# Patient Record
Sex: Female | Born: 1955 | Race: White | Hispanic: No | Marital: Single | Smoking: Former smoker
Health system: Southern US, Community
[De-identification: ages and names within clinical notes are randomized; demographics above are authoritative.]

## PROBLEM LIST (undated history)

## (undated) DIAGNOSIS — E669 Obesity, unspecified: Secondary | ICD-10-CM

## (undated) DIAGNOSIS — I10 Essential (primary) hypertension: Secondary | ICD-10-CM

## (undated) DIAGNOSIS — E079 Disorder of thyroid, unspecified: Secondary | ICD-10-CM

## (undated) DIAGNOSIS — N200 Calculus of kidney: Secondary | ICD-10-CM

## (undated) DIAGNOSIS — K90829 Short bowel syndrome, unspecified: Secondary | ICD-10-CM

## (undated) DIAGNOSIS — R011 Cardiac murmur, unspecified: Secondary | ICD-10-CM

## (undated) DIAGNOSIS — I2699 Other pulmonary embolism without acute cor pulmonale: Secondary | ICD-10-CM

## (undated) DIAGNOSIS — K529 Noninfective gastroenteritis and colitis, unspecified: Secondary | ICD-10-CM

## (undated) DIAGNOSIS — K912 Postsurgical malabsorption, not elsewhere classified: Secondary | ICD-10-CM

## (undated) HISTORY — PX: KIDNEY STONE SURGERY: SHX686

## (undated) HISTORY — PX: WRIST SURGERY: SHX841

## (undated) HISTORY — PX: PANNICULECTOMY: SUR1001

## (undated) HISTORY — PX: LIVER RESECTION: SHX1977

## (undated) HISTORY — PX: ABDOMINAL WALL MESH  REMOVAL: SHX1116

---

## 1997-01-10 DIAGNOSIS — I82409 Acute embolism and thrombosis of unspecified deep veins of unspecified lower extremity: Secondary | ICD-10-CM | POA: Insufficient documentation

## 1997-01-10 HISTORY — DX: Acute embolism and thrombosis of unspecified deep veins of unspecified lower extremity: I82.409

## 2007-01-11 HISTORY — PX: COLON SURGERY: SHX602

## 2007-01-11 HISTORY — PX: ILEOSTOMY: SHX1783

## 2007-01-11 HISTORY — PX: ILEOSTOMY CLOSURE: SHX1784

## 2007-05-11 HISTORY — PX: CHOLECYSTECTOMY: SHX55

## 2007-08-27 ENCOUNTER — Emergency Department (HOSPITAL_COMMUNITY): Admission: EM | Admit: 2007-08-27 | Discharge: 2007-08-27 | Payer: Self-pay | Admitting: Emergency Medicine

## 2007-08-31 ENCOUNTER — Inpatient Hospital Stay (HOSPITAL_COMMUNITY): Admission: EM | Admit: 2007-08-31 | Discharge: 2007-09-02 | Payer: Self-pay | Admitting: Emergency Medicine

## 2007-10-11 HISTORY — PX: HERNIA REPAIR: SHX51

## 2008-08-07 ENCOUNTER — Emergency Department (HOSPITAL_BASED_OUTPATIENT_CLINIC_OR_DEPARTMENT_OTHER): Admission: EM | Admit: 2008-08-07 | Discharge: 2008-08-07 | Payer: Self-pay | Admitting: Emergency Medicine

## 2009-01-10 DIAGNOSIS — I2699 Other pulmonary embolism without acute cor pulmonale: Secondary | ICD-10-CM | POA: Insufficient documentation

## 2009-01-10 HISTORY — DX: Other pulmonary embolism without acute cor pulmonale: I26.99

## 2009-11-22 ENCOUNTER — Ambulatory Visit: Payer: Self-pay | Admitting: Diagnostic Radiology

## 2009-11-22 ENCOUNTER — Emergency Department (HOSPITAL_BASED_OUTPATIENT_CLINIC_OR_DEPARTMENT_OTHER): Admission: EM | Admit: 2009-11-22 | Discharge: 2009-11-22 | Payer: Self-pay | Admitting: Emergency Medicine

## 2010-03-23 LAB — COMPREHENSIVE METABOLIC PANEL
ALT: 9 U/L (ref 0–35)
Albumin: 3.7 g/dL (ref 3.5–5.2)
Alkaline Phosphatase: 73 U/L (ref 39–117)
BUN: 18 mg/dL (ref 6–23)
Calcium: 8.9 mg/dL (ref 8.4–10.5)
Glucose, Bld: 93 mg/dL (ref 70–99)
Potassium: 3.3 mEq/L — ABNORMAL LOW (ref 3.5–5.1)
Sodium: 143 mEq/L (ref 135–145)
Total Protein: 6.6 g/dL (ref 6.0–8.3)

## 2010-03-23 LAB — DIFFERENTIAL
Lymphs Abs: 1.9 10*3/uL (ref 0.7–4.0)
Monocytes Absolute: 0.4 10*3/uL (ref 0.1–1.0)
Monocytes Relative: 6 % (ref 3–12)
Neutro Abs: 3.3 10*3/uL (ref 1.7–7.7)
Neutrophils Relative %: 57 % (ref 43–77)

## 2010-03-23 LAB — PREGNANCY, URINE: Preg Test, Ur: NEGATIVE

## 2010-03-23 LAB — URINALYSIS, ROUTINE W REFLEX MICROSCOPIC
Glucose, UA: NEGATIVE mg/dL
Ketones, ur: 15 mg/dL — AB
Protein, ur: NEGATIVE mg/dL
Urobilinogen, UA: 1 mg/dL (ref 0.0–1.0)

## 2010-03-23 LAB — CBC
HCT: 32.4 % — ABNORMAL LOW (ref 36.0–46.0)
MCHC: 35.6 g/dL (ref 30.0–36.0)
Platelets: 254 10*3/uL (ref 150–400)
RDW: 14.4 % (ref 11.5–15.5)
WBC: 5.9 10*3/uL (ref 4.0–10.5)

## 2010-05-25 NOTE — Discharge Summary (Signed)
[name], [name]              ACCOUNT NO.:  [id number]   MEDICAL RECORD NO.:  [id number]          PATIENT TYPE:  INP   LOCATION:  5149                         FACILITY:  [facility]   PHYSICIAN:  Altha Harm, MDDATE OF BIRTH:  06-15-1955   DATE OF ADMISSION:  08/31/2007  DATE OF DISCHARGE:  09/02/2007                               DISCHARGE SUMMARY   DISCHARGE DISPOSITION:  Home.   FINAL DISCHARGE DIAGNOSES:  1. Hypokalemia, repleted.  2. Chronic diarrhea.  3. Chronic intermittent emesis.  4. History of angioedema.  5. History of iron deficiency anemia, however, the patient is allergic      to iron.  6. History of intestinal perforation.  7. Status post ileostomy post perforation with ileostomy takedown.  8. New diagnosis hypothyroidism with an elevated TSH.   DISCHARGE MEDICATIONS:  1. Include the following, Reglan 10 mg p.o. a.c. and at bedtime.  2. Celexa 20 mg p.o. daily.  3. Metamucil daily p.r.n.  4. Flagyl 500 mg p.o. q.i.d., started as an outpatient on August 17      and to be completed on August 27.  5. Potassium chloride 40 mEq p.o. b.i.d.  6. Magnesium oxide 400 mg p.o. daily.  7. Synthroid 50 mcg p.o. daily.   CONSULTANTS:  None.   PROCEDURES:  None.   DIAGNOSTIC STUDIES:  Acute abdominal x-ray series which shows  nonspecific bowel gas pattern with gas filled loops but nondilated small  bowel noted.  Appearance is nonspecific but could be due to enteritis.   CODE STATUS:  Full code.   ALLERGIES:  IRON, PENICILLIN, LATEX.   CHIEF COMPLAINT:  Burning in the stomach.   HISTORY OF PRESENT ILLNESS:  Please refer to the H&P dictated by Dr.  [name] Royalty on August 31, 2007, for details of the HPI.   HOSPITAL COURSE:  1. The patient during the course of her evaluation was found to be      hypokalemic with a potassium of 2.2.  For this we were asked to      admit the patient.  The patient was admitted and started on      potassium replacement.  The patient  was also mildly dehydrated and      started on IV fluids fortified with potassium.  The patient      repleted her potassium up to 3.1 on the day of discharge and      received some additional dose of p.o. potassium.  The patient      states that she has intermittent vomiting and diarrhea which has      been going on for over a year.  The patient has been under the care      of Dr. Marcelene Butte, gastroenterologist at [facility] and under the care of her primary care physician, Dr.      Derrell Lolling.  She says that she has been hospitalized on multiple      occasions for hypokalemia associated with this.  However, the      patient is not on any potassium replacement at this time.  The      patient has had no emesis while hospitalized but did have diarrhea.      She refused Imodium stating that it has not worked for her in the      past with this diarrhea.  I would recommend that the patient be on      chronic potassium replacement as well as magnesium replacement.  2. Hypothyroidism.  The patient was found to have an elevated TSH up      to 5.132.  A free T3 and T4 were not done here in the hospital and      can be pursued by her primary care physician as an outpatient.  The      patient, however, was started on a small dose of Synthroid 50 mcg      p.o. daily.  I would recommend that the patient has a TSH, free T4      and T3 rechecked in approximately 6 weeks with further titration.  3. Enteritis.  The patient was seen in the emergency room on August 17      and determined to have an enteritis but CT criteria.  She was      started on Flagyl.  Given the fact that the patient had been      partially treated prior to coming to the hospital thus making the      assessment difficult I would recommend that the patient continues      with her Flagyl to the end of the prescription.  Otherwise, the      patient is continued on her usual medications.  She is      hemodynamically  stable.  She is tolerating diet well without any      difficulty.   FOLLOWUP:  I would recommend that the patient follow up with her primary  care physician, Dr. Derrell Lolling within a week and with her gastroenterologist,  Dr. Marcelene Butte as needed.  Dietary restrictions are none.      Altha Harm, MD  Electronically Signed     MAM/MEDQ  D:  09/02/2007  T:  09/02/2007  Job:  [id number]

## 2010-05-25 NOTE — H&P (Signed)
[name], [name] NO.:  [id number]   MEDICAL RECORD NO.:  [id number]          PATIENT TYPE:  EMS   LOCATION:  MAJO                         FACILITY:  [facility]   PHYSICIAN:  Altha Harm, MDDATE OF BIRTH:  11-16-1955   DATE OF ADMISSION:  08/31/2007  DATE OF DISCHARGE:                              HISTORY & PHYSICAL   CHIEF COMPLAINT:  Burning in her stomach.   HISTORY OF PRESENT ILLNESS:  This is a lady with a history of chronic  diarrhea and chronic intermittent emesis, who presents to the emergency  room with complaints of profound heartburn.  The patient's history of  present illness goes back to last year when the patient states that she  started having diarrhea and vomiting.  The patient had been under the  care of her primary care physician, Dr. Ninfa Meeker, at [facility] in [facility].  The patient was referred to Dr. Hyman Bower, gastroenterologist  in [facility], who evaluated her for the vomiting and diarrhea.  A  colonoscopy was performed, and the patient states that she had a  perforation which necessitated ileostomy.  The patient subsequently had  an ileostomy take-down; however, the patient states that throughout the  entire evaluation and subsequent to it, she has continued to have  diarrhea and chronic intermittent vomiting, which has necessitated her  being hospitalized for a potassium replacement on an intermittent basis;  however, the patient is not on any potassium replacement as an  outpatient.  The etiology of the vomiting and diarrhea has not been  found; however, the patient states that extensive workups have been done  as an outpatient.   During her evaluation here, the patient was found to have a potassium of  2.2, and that is why we are asked to admit the patient for the  hypokalemia.  The patient is being admitted for potassium replacement;  however, given the fact that the patient has had an extensive workup and  this is a chronic  problem, the object of this hospitalization will not  be to work up the etiology of her vomiting and diarrhea.  The patient  denies any chills.  She denies any fevers.  She denies any dizziness,  loss of consciousness, body aches, or weakness.   PAST MEDICAL HISTORY:  1. Angioedema.  2. Iron-deficiency anemia.  3. Intestinal perforation.  4. Status post ileostomy with ileostomy take-down.   SOCIAL HISTORY:  Patient is unemployed at present.  She denies any  tobacco, alcohol, or drug use currently.   CURRENT MEDICATIONS:  1. Celexa.  2. Maalox.  3. Reglan.   Patient does not know the doses of these medications.  The patient was  seen in the emergency room here on the 17th and at that time given  Flagyl by the ER physician.   ALLERGIES:  IRON.   PRIMARY CARE PHYSICIAN:  Dr. Ninfa Meeker with [facility].   GASTROENTEROLOGIST:  Dr. Vashti Hey in [facility].   REVIEW OF SYSTEMS:  Twelve systems are reviewed.  All systems are  negative except as noted in the HPI.   STUDIES IN THE  EMERGENCY ROOM:  She has the following white blood cell  count of 5.3, hemoglobin 12.6, hematocrit 38.1, platelet count 246.  Sodium 142, potassium 2.2, chloride 108, bicarb 24, BUN 5, creatinine  0.8, calcium 7.7, albumin 2.5, corrected calcium 8.5.   Abdominal x-rays are negative for any evidence of intra-abdominal  process.   Review of CT of the pelvis and abdomen done on the 17th showed that the  patient had inflammation of the upper sigmoid colon, likely the mild  diffuse colitis.   PHYSICAL EXAMINATION:  The patient is lying in bed in no acute distress.  She is not ill-appearing.  VITAL SIGNS:  As follows:  Temperature 97.5 orally, blood pressure  137/85, heart rate 58, respiratory rate 18, O2 sats 95% on room air.  HEENT:  She is normocephalic and atraumatic.  Pupils are equal, round  and reactive to light and accommodation.  Extraocular movements are  intact.  Oropharynx is tachy.  No  exudate, erythema, or lesions are  noted.  NECK:  Trachea is midline.  No masses, no thyromegaly.  No carotid  bruits.  The patient has a large neck, and I am unable to appreciate any  JVD.  RESPIRATORY:  Patient has a normal respiratory effort.  Equal excursion  bilaterally.  No wheezing or rhonchi noted.  CARDIOVASCULAR:  She has a normal S1 and S2.  No murmurs, rubs or  gallops are noted.  PMI is nondisplaced.  No heaves or thrills on  palpation.  ABDOMEN:  Patient has a very obese abdomen.  She has a healed midline  scar.  The abdomen is obese, soft, nontender, nondistended.  No masses,  no hepatosplenomegaly.  MUSCULOSKELETAL:  The patient has no spinal tenderness.  She has no  warmth, swelling, or erythema around the joint.  She has no CVA  tenderness.  NEUROLOGIC:  There are no focal neurological deficits.  Cranial nerves  II-XII are grossly intact.   ASSESSMENT/PLAN:  This is a patient who has chronic diarrhea and  intermittent vomiting.  The patient has resultant hypokalemia and is  being admitted for replacement of potassium.  While she is here, I will  check a TSH on the patient, just to be sure that we do not have thyroid  dysfunction contributing to this.  Based on the history that the patient  gives, I would encourage the patient to seek further evaluation at a  center dealing entirely on enteral dysfunction.  The patient has been  worked up under the care of gastroenterologist, Dr. Marcelene Butte, and I  will refer her back to Dr. Marcelene Butte for further evaluation of her  gastrointestinal woes.      Altha Harm, MD  Electronically Signed     MAM/MEDQ  D:  08/31/2007  T:  08/31/2007  Job:  [phone]

## 2011-06-05 ENCOUNTER — Encounter (HOSPITAL_BASED_OUTPATIENT_CLINIC_OR_DEPARTMENT_OTHER): Payer: Self-pay | Admitting: *Deleted

## 2011-06-05 ENCOUNTER — Emergency Department (HOSPITAL_BASED_OUTPATIENT_CLINIC_OR_DEPARTMENT_OTHER)
Admission: EM | Admit: 2011-06-05 | Discharge: 2011-06-05 | Disposition: A | Discharge status: Home / Self Care | Payer: Medicare Other | Attending: Emergency Medicine | Admitting: Emergency Medicine

## 2011-06-05 DIAGNOSIS — I10 Essential (primary) hypertension: Secondary | ICD-10-CM | POA: Insufficient documentation

## 2011-06-05 DIAGNOSIS — Y92009 Unspecified place in unspecified non-institutional (private) residence as the place of occurrence of the external cause: Secondary | ICD-10-CM | POA: Insufficient documentation

## 2011-06-05 DIAGNOSIS — E079 Disorder of thyroid, unspecified: Secondary | ICD-10-CM | POA: Insufficient documentation

## 2011-06-05 DIAGNOSIS — Z86711 Personal history of pulmonary embolism: Secondary | ICD-10-CM | POA: Insufficient documentation

## 2011-06-05 DIAGNOSIS — X58XXXA Exposure to other specified factors, initial encounter: Secondary | ICD-10-CM | POA: Insufficient documentation

## 2011-06-05 DIAGNOSIS — S8010XA Contusion of unspecified lower leg, initial encounter: Secondary | ICD-10-CM | POA: Insufficient documentation

## 2011-06-05 HISTORY — DX: Cardiac murmur, unspecified: R01.1

## 2011-06-05 HISTORY — DX: Essential (primary) hypertension: I10

## 2011-06-05 HISTORY — DX: Disorder of thyroid, unspecified: E07.9

## 2011-06-05 HISTORY — DX: Other pulmonary embolism without acute cor pulmonale: I26.99

## 2011-06-05 NOTE — ED Provider Notes (Signed)
History     CSN: [id number]  Arrival date & time 06/05/11  1836   First MD Initiated Contact with Patient 06/05/11 1948      HPI Patient reports unknown area of redness on her right lower leg. States she noticed it yesterday and since area has become more painful and more red. Reports a history of phlebitis and pulmonary embolism. Denies history of DVT. Tender area is on anterior right lower leg. Denies chest pain, shortness of breath, lower extremity edema.   Patient is a 56 y.o. female presenting with leg pain. The history is provided by the patient.  Leg Pain  The incident occurred yesterday. The incident occurred at home. The injury mechanism is unknown. The pain is present in the right leg. The quality of the pain is described as aching. The pain is moderate. Pertinent negatives include no numbness, no inability to bear weight, no loss of motion, no muscle weakness, no loss of sensation and no tingling. She reports no foreign bodies present. The symptoms are aggravated by palpation. She has tried rest for the symptoms. The treatment provided no relief.    Past Medical History  Diagnosis Date  . Hypertension   . Thyroid disease   . Heart murmur   . Pulmonary embolism     Past Surgical History  Procedure Date  . Colon surgery   . Ileostomy   . Ileostomy closure   . Cholecystectomy   . Hernia repair     History reviewed. No pertinent family history.  History  Substance Use Topics  . Smoking status: Never Smoker   . Smokeless tobacco: Not on file  . Alcohol Use: No    OB History    Grav Para Term Preterm Abortions TAB SAB Ect Mult Living                  Review of Systems  Constitutional: Negative for fever and chills.  Cardiovascular: Negative for chest pain and leg swelling.  Musculoskeletal: Negative for back pain and joint swelling.       Leg pain  Neurological: Negative for tingling and numbness.  Hematological: Bruises/bleeds easily.  All other systems  reviewed and are negative.    Allergies  Ivp dye; Ace inhibitors; and Penicillins  Home Medications   Current Outpatient Rx  Name Route Sig Dispense Refill  . ACETAMINOPHEN 325 MG PO TABS Oral Take 650 mg by mouth every 6 (six) hours as needed. Patient used this medication for her headache.    Marland Kitchen HYDROXYZINE HCL 25 MG PO TABS Oral Take 25 mg by mouth 3 (three) times daily as needed.    Marland Kitchen LEVOTHYROXINE SODIUM 50 MCG PO TABS Oral Take 50 mcg by mouth daily.    Marland Kitchen MAGNESIUM OXIDE 400 MG PO TABS Oral Take 400 mg by mouth daily.    Marland Kitchen METOPROLOL SUCCINATE ER 25 MG PO TB24 Oral Take 25 mg by mouth daily.    Marland Kitchen MONTELUKAST SODIUM 10 MG PO TABS Oral Take 10 mg by mouth at bedtime.    Marland Kitchen PROMETHAZINE HCL 25 MG PO TABS Oral Take 25 mg by mouth every 6 (six) hours as needed.    . VENLAFAXINE HCL ER 150 MG PO CP24 Oral Take 150 mg by mouth daily.    Marland Kitchen VITAMIN D (ERGOCALCIFEROL) 50000 UNITS PO CAPS Oral Take 50,000 Units by mouth every 7 (seven) days.      BP 131/68  Pulse 77  Temp(Src) 98.5 F (36.9 C) (Oral)  Resp  20  Ht 5\' 2"  (1.575 m)  Wt 298 lb (135.172 kg)  BMI 54.50 kg/m2  SpO2 99%  Physical Exam  Vitals reviewed. Constitutional: She is oriented to person, place, and time. She appears well-developed and well-nourished.  HENT:  Head: Normocephalic and atraumatic.  Eyes: Conjunctivae and EOM are normal. Pupils are equal, round, and reactive to light.  Neck: Normal range of motion. Neck supple. No spinous process tenderness and no muscular tenderness present. No edema, no erythema and normal range of motion present.  Cardiovascular: Normal rate, regular rhythm and normal heart sounds.  Exam reveals no friction rub.   No murmur heard. Pulmonary/Chest: Effort normal and breath sounds normal. She has no wheezes. She has no rales. She exhibits no tenderness.  Abdominal:       Morbidly obese.  Musculoskeletal: Normal range of motion. She exhibits no edema and no tenderness.       Cervical  back: Normal. She exhibits normal range of motion, no tenderness, no bony tenderness, no swelling and no pain.       Thoracic back: Normal. She exhibits no tenderness, no bony tenderness, no swelling, no deformity and no pain.       Lumbar back: Normal. She exhibits normal range of motion, no tenderness, no bony tenderness, no swelling, no deformity and no pain.  Neurological: She is alert and oriented to person, place, and time. She has normal strength. No sensory deficit. Coordination and gait normal.  Skin: Skin is warm and dry. No rash noted. No erythema. No pallor.       Right anterior lower leg has a large 5 x 5 cm hematoma. Tender to Palpation. No cords or masses palpated. Normal distal pulses and sensation. Patient does have bilateral +1 pitting edema.    ED Course  Procedures  MDM  Patient's lower extremity does not appear concerning for DVT. Concerning area is of anterior leg.Marland Kitchen Appears to be bruised. Advised patient to use warm compresses. Patient does reports she bruises easily. Patient is ready for discharge. Voices understanding to return for worsening symptoms.      Thomasene Lot, PA-C 06/05/11 2113

## 2011-06-05 NOTE — Discharge Instructions (Signed)

## 2011-06-05 NOTE — ED Notes (Signed)
Pt states she noticed a small red area to her right leg last night that has gotten bigger and now burns.

## 2011-06-05 NOTE — ED Provider Notes (Signed)
Medical screening examination/treatment/procedure(s) were performed by non-physician practitioner and as supervising physician I was immediately available for consultation/collaboration.  Ethelda Chick, MD 06/05/11 2214

## 2011-11-06 ENCOUNTER — Emergency Department (HOSPITAL_BASED_OUTPATIENT_CLINIC_OR_DEPARTMENT_OTHER)
Admission: EM | Admit: 2011-11-06 | Discharge: 2011-11-06 | Disposition: A | Discharge status: Home / Self Care | Payer: Medicare Other | Attending: Emergency Medicine | Admitting: Emergency Medicine

## 2011-11-06 ENCOUNTER — Emergency Department (HOSPITAL_BASED_OUTPATIENT_CLINIC_OR_DEPARTMENT_OTHER): Payer: Medicare Other

## 2011-11-06 ENCOUNTER — Encounter (HOSPITAL_BASED_OUTPATIENT_CLINIC_OR_DEPARTMENT_OTHER): Payer: Self-pay | Admitting: *Deleted

## 2011-11-06 DIAGNOSIS — Z8041 Family history of malignant neoplasm of ovary: Secondary | ICD-10-CM | POA: Insufficient documentation

## 2011-11-06 DIAGNOSIS — D219 Benign neoplasm of connective and other soft tissue, unspecified: Secondary | ICD-10-CM

## 2011-11-06 DIAGNOSIS — N949 Unspecified condition associated with female genital organs and menstrual cycle: Secondary | ICD-10-CM | POA: Insufficient documentation

## 2011-11-06 DIAGNOSIS — E079 Disorder of thyroid, unspecified: Secondary | ICD-10-CM | POA: Insufficient documentation

## 2011-11-06 DIAGNOSIS — R102 Pelvic and perineal pain: Secondary | ICD-10-CM

## 2011-11-06 DIAGNOSIS — N950 Postmenopausal bleeding: Secondary | ICD-10-CM | POA: Insufficient documentation

## 2011-11-06 DIAGNOSIS — I10 Essential (primary) hypertension: Secondary | ICD-10-CM | POA: Insufficient documentation

## 2011-11-06 DIAGNOSIS — Z8679 Personal history of other diseases of the circulatory system: Secondary | ICD-10-CM | POA: Insufficient documentation

## 2011-11-06 DIAGNOSIS — D259 Leiomyoma of uterus, unspecified: Secondary | ICD-10-CM | POA: Insufficient documentation

## 2011-11-06 DIAGNOSIS — Z79899 Other long term (current) drug therapy: Secondary | ICD-10-CM | POA: Insufficient documentation

## 2011-11-06 DIAGNOSIS — Z86718 Personal history of other venous thrombosis and embolism: Secondary | ICD-10-CM | POA: Insufficient documentation

## 2011-11-06 DIAGNOSIS — Z9889 Other specified postprocedural states: Secondary | ICD-10-CM | POA: Insufficient documentation

## 2011-11-06 LAB — BASIC METABOLIC PANEL
Chloride: 101 mEq/L (ref 96–112)
Creatinine, Ser: 0.8 mg/dL (ref 0.50–1.10)
GFR calc Af Amer: >90 mL/min (ref >90)
Sodium: 138 mEq/L (ref 135–145)

## 2011-11-06 LAB — URINE MICROSCOPIC-ADD ON

## 2011-11-06 LAB — CBC WITH DIFFERENTIAL/PLATELET
Basophils Absolute: 0 10*3/uL (ref 0.0–0.1)
Basophils Relative: 0 % (ref 0–1)
HCT: 37.7 % (ref 36.0–46.0)
MCHC: 33.2 g/dL (ref 30.0–36.0)
Monocytes Absolute: 0.4 10*3/uL (ref 0.1–1.0)
Neutro Abs: 5 10*3/uL (ref 1.7–7.7)
Neutrophils Relative %: 62 % (ref 43–77)
Platelets: 294 10*3/uL (ref 150–400)
RDW: 13.6 % (ref 11.5–15.5)
WBC: 8.2 10*3/uL (ref 4.0–10.5)

## 2011-11-06 LAB — WET PREP, GENITAL
Trich, Wet Prep: NONE SEEN
Yeast Wet Prep HPF POC: NONE SEEN

## 2011-11-06 LAB — URINALYSIS, ROUTINE W REFLEX MICROSCOPIC
Glucose, UA: NEGATIVE mg/dL
Leukocytes, UA: NEGATIVE
pH: 5.5 (ref 5.0–8.0)

## 2011-11-06 MED ORDER — OXYCODONE-ACETAMINOPHEN 5-325 MG PO TABS: 1.0000 | ORAL_TABLET | ORAL | Status: DC | PRN

## 2011-11-06 MED ORDER — OXYCODONE-ACETAMINOPHEN 5-325 MG PO TABS
2.0000 | ORAL_TABLET | Freq: Once | ORAL | Status: AC
  Administered 2011-11-06: 2 via ORAL
  Filled 2011-11-06 (×2): qty 2

## 2011-11-06 NOTE — ED Provider Notes (Addendum)
History     CSN: [id number]  Arrival date & time 11/06/11  1401   First MD Initiated Contact with Patient 11/06/11 1507      Chief Complaint  Patient presents with  . Abdominal Pain    (Consider location/radiation/quality/duration/timing/severity/associated sxs/prior treatment) HPI Comments: [name] 56 y.o. female   The chief complaint is: Patient presents with:   Abdominal Pain    C/o abdominla pain and vaginal bleeding.  LMP was 02-13-2007. [facility] mother who died of ovarian cancer in her 43s.  Patient began spotting this past wed.  Seen and PCP with f/u gyn this coming tuedsy 10/29.  Yesterday patient experienced sudden onset suprapubic abdominal pain that feels "like a knife" stabbing through to her back.  At greates sever8ity oain was "100/10" and ios nw 7/10.  She had increased bleeding yesterday as well.  Soaking through 2 overnight pads an hour and went through an entire bag of pads yesterday. Patient states that she may have hx of uterine fibroids.  Denies urinary symptoms.  Patient has chronic diarrhea and nausea but this is unchanged.  Denies sexual intercourse in the last year.  Denies vaginal sxs other thatn bleeding. Denies fevers, chills, myalgias, arthralgias. Denies DOE, SOB, chest tightness or pressure, radiation to left arm, jaw or back, or diaphoresis. Denies dysuria, flank pain, frequency, urgency, or hematuria. Denies headaches, light headedness, weakness, visual disturbances. Denies  vomiting or constipation.     Patient is a 56 y.o. female presenting with abdominal pain. The history is provided by the patient.  Abdominal Pain The primary symptoms of the illness include abdominal pain and vaginal bleeding. The primary symptoms of the illness do not include fever, shortness of breath, nausea, vomiting, diarrhea, dysuria or vaginal discharge.  Symptoms associated with the illness do not include chills, constipation or hematuria.    Past Medical History   Diagnosis Date  . Hypertension   . Thyroid disease   . Heart murmur   . Pulmonary embolism     Past Surgical History  Procedure Date  . Colon surgery   . Ileostomy   . Ileostomy closure   . Cholecystectomy   . Hernia repair     No family history on file.  History  Substance Use Topics  . Smoking status: Never Smoker   . Smokeless tobacco: Not on file  . Alcohol Use: No    OB History    Grav Para Term Preterm Abortions TAB SAB Ect Mult Living                  Review of Systems  Constitutional: Negative for fever and chills.  HENT: Negative for trouble swallowing.   Respiratory: Negative for shortness of breath.   Cardiovascular: Negative for chest pain.  Gastrointestinal: Positive for abdominal pain. Negative for nausea, vomiting, diarrhea and constipation.  Genitourinary: Positive for vaginal bleeding and vaginal pain. Negative for dysuria, hematuria and vaginal discharge.  Musculoskeletal: Negative for myalgias and arthralgias.  Skin: Negative for rash.  Neurological: Negative for numbness.  All other systems reviewed and are negative.    Allergies  Ivp dye; Ace inhibitors; and Penicillins  Home Medications   Current Outpatient Rx  Name Route Sig Dispense Refill  . COLESEVELAM HCL 625 MG PO TABS Oral Take 1,875 mg by mouth 2 (two) times daily with a meal.    . DICYCLOMINE HCL 20 MG PO TABS Oral Take 20 mg by mouth every 6 (six) hours.    Marland Kitchen ESOMEPRAZOLE  MAGNESIUM 40 MG PO CPDR Oral Take 40 mg by mouth daily before breakfast.    . ACETAMINOPHEN 325 MG PO TABS Oral Take 650 mg by mouth every 6 (six) hours as needed. Patient used this medication for her headache.    Marland Kitchen HYDROXYZINE HCL 25 MG PO TABS Oral Take 25 mg by mouth 3 (three) times daily as needed.    Marland Kitchen LEVOTHYROXINE SODIUM 50 MCG PO TABS Oral Take 75 mcg by mouth daily.     Marland Kitchen MAGNESIUM OXIDE 400 MG PO TABS Oral Take 400 mg by mouth daily.    Marland Kitchen METOPROLOL SUCCINATE ER 25 MG PO TB24 Oral Take 25 mg by  mouth daily.    Marland Kitchen MONTELUKAST SODIUM 10 MG PO TABS Oral Take 10 mg by mouth at bedtime.    Marland Kitchen PROMETHAZINE HCL 25 MG PO TABS Oral Take 25 mg by mouth every 6 (six) hours as needed.    . VENLAFAXINE HCL ER 150 MG PO CP24 Oral Take 150 mg by mouth daily.    Marland Kitchen VITAMIN D (ERGOCALCIFEROL) 50000 UNITS PO CAPS Oral Take 50,000 Units by mouth every 7 (seven) days.      BP 161/103  Pulse 79  Temp 97.9 F (36.6 C) (Oral)  Resp 20  Ht 5\' 3"  (1.6 m)  Wt 301 lb (136.533 kg)  BMI 53.32 kg/m2  SpO2 100%  Physical Exam  Nursing note and vitals reviewed. Constitutional: She is oriented to person, place, and time. She appears well-developed and well-nourished. No distress.       Pleasant, morbidly obese female in NAD  HENT:  Head: Normocephalic and atraumatic.  Eyes: Conjunctivae normal and EOM are normal. Pupils are equal, round, and reactive to light. No scleral icterus.  Neck: Normal range of motion.  Cardiovascular: Normal rate, regular rhythm and normal heart sounds.  Exam reveals no gallop and no friction rub.   No murmur heard. Pulmonary/Chest: Effort normal and breath sounds normal. No respiratory distress.  Abdominal: Soft. Bowel sounds are normal. She exhibits no distension and no mass. There is no tenderness. There is no guarding.  Neurological: She is alert and oriented to person, place, and time.  Skin: Skin is warm and dry. She is not diaphoretic.    ED Course  Procedures (including critical care time)    Results for orders placed during the hospital encounter of 11/06/11  URINALYSIS, ROUTINE W REFLEX MICROSCOPIC      Component Value Range   Color, Urine YELLOW  YELLOW   APPearance CLEAR  CLEAR   Specific Gravity, Urine 1.015  1.005 - 1.030   pH 5.5  5.0 - 8.0   Glucose, UA NEGATIVE  NEGATIVE mg/dL   Hgb urine dipstick LARGE (*) NEGATIVE   Bilirubin Urine NEGATIVE  NEGATIVE   Ketones, ur NEGATIVE  NEGATIVE mg/dL   Protein, ur NEGATIVE  NEGATIVE mg/dL   Urobilinogen, UA  0.2  0.0 - 1.0 mg/dL   Nitrite NEGATIVE  NEGATIVE   Leukocytes, UA NEGATIVE  NEGATIVE  URINE MICROSCOPIC-ADD ON      Component Value Range   Squamous Epithelial / LPF RARE  RARE   WBC, UA 0-2  <3 WBC/hpf   RBC / HPF TOO NUMEROUS TO COUNT  <3 RBC/hpf   Bacteria, UA FEW (*) RARE  CBC WITH DIFFERENTIAL      Component Value Range   WBC 8.2  4.0 - 10.5 K/uL   RBC 3.95  3.87 - 5.11 MIL/uL   Hemoglobin 12.5  12.0 -  15.0 g/dL   HCT 16.1  09.6 - 04.5 %   MCV 95.4  78.0 - 100.0 fL   MCH 31.6  26.0 - 34.0 pg   MCHC 33.2  30.0 - 36.0 g/dL   RDW 40.9  81.1 - 91.4 %   Platelets 294  150 - 400 K/uL   Neutrophils Relative 62  43 - 77 %   Neutro Abs 5.0  1.7 - 7.7 K/uL   Lymphocytes Relative 31  12 - 46 %   Lymphs Abs 2.5  0.7 - 4.0 K/uL   Monocytes Relative 5  3 - 12 %   Monocytes Absolute 0.4  0.1 - 1.0 K/uL   Eosinophils Relative 2  0 - 5 %   Eosinophils Absolute 0.2  0.0 - 0.7 K/uL   Basophils Relative 0  0 - 1 %   Basophils Absolute 0.0  0.0 - 0.1 K/uL  BASIC METABOLIC PANEL      Component Value Range   Sodium 138  135 - 145 mEq/L   Potassium 3.6  3.5 - 5.1 mEq/L   Chloride 101  96 - 112 mEq/L   CO2 24  19 - 32 mEq/L   Glucose, Bld 98  70 - 99 mg/dL   BUN 15  6 - 23 mg/dL   Creatinine, Ser 7.82  0.50 - 1.10 mg/dL   Calcium 9.1  8.4 - 95.6 mg/dL   GFR calc non Af Amer 82 (*) >90 mL/min   GFR calc Af Amer >90  >90 mL/min    Pelvic exam: normal external genitalia, vulva, vagina, cervix, uterus and adnexa, VULVA: normal appearing vulva with no masses, tenderness or lesions, VAGINA: atrophic, vaginal tenderness at introitus- patient states that it feelslike "pins and needles." blood in vaginal vault- unable to detemine source exam limited by body habitus and position.- unable to visulize cervix or feel cervix/internalorgans on bimanual.  US Transvaginal Non-ob  11/06/2011  *RADIOLOGY REPORT*  Clinical Data: Post menopausal vaginal bleeding.  TRANSABDOMINAL AND TRANSVAGINAL ULTRASOUND  OF PELVIS Technique:  Both transabdominal and transvaginal ultrasound examinations of the pelvis were performed. Transabdominal technique was performed for global imaging of the pelvis including uterus, ovaries, adnexal regions, and pelvic cul-de-sac.  It was necessary to proceed with endovaginal exam following the transabdominal exam to visualize the uterus and ovaries to better advantage. The transabdominal study is limited.  Comparison:  Pelvic CT 11/22/2009.  Findings:  Uterus: Measures approximately 10.6 x 5.7 x 9.5 cm.  There are multiple peripherally calcified intramural fibroids.  The largest measures 5.0 x 5.3 x 4.4 cm.  Endometrium: Measures 6.9 mm in thickness.  Reported LMP is 01/16/2007.  Right ovary:  Not visualized.  Left ovary: Not visualized.  Other findings: No adnexal mass or free pelvic fluid identified.  IMPRESSION:  1.  Multiple uterine fibroids, with peripheral calcifications as correlated with prior CT. 2.  The endometrium measures 6.9 mm in thickness in this patient who is post menopausal. Correlation with hormone replacement therapy necessary. In the setting of post-menopausal bleeding and no hormone replacement therapy, endometrial sampling is indicated to exclude carcinoma.  If results are benign, sonohysterogram should be considered for focal lesion work-up. (Ref:  Radiological Reasoning: Algorithmic Workup of Abnormal Vaginal Bleeding with Endovaginal Sonography and Sonohysterography. AJR 2008; 213:Y86-57) 3.  Neither ovary visualized.   Original Report Authenticated By: Gerrianne Scale, M.D.    US Pelvis Complete  11/06/2011  *RADIOLOGY REPORT*  Clinical Data: Post menopausal vaginal bleeding.  TRANSABDOMINAL AND  TRANSVAGINAL ULTRASOUND OF PELVIS Technique:  Both transabdominal and transvaginal ultrasound examinations of the pelvis were performed. Transabdominal technique was performed for global imaging of the pelvis including uterus, ovaries, adnexal regions, and pelvic  cul-de-sac.  It was necessary to proceed with endovaginal exam following the transabdominal exam to visualize the uterus and ovaries to better advantage. The transabdominal study is limited.  Comparison:  Pelvic CT 11/22/2009.  Findings:  Uterus: Measures approximately 10.6 x 5.7 x 9.5 cm.  There are multiple peripherally calcified intramural fibroids.  The largest measures 5.0 x 5.3 x 4.4 cm.  Endometrium: Measures 6.9 mm in thickness.  Reported LMP is 01/16/2007.  Right ovary:  Not visualized.  Left ovary: Not visualized.  Other findings: No adnexal mass or free pelvic fluid identified.  IMPRESSION:  1.  Multiple uterine fibroids, with peripheral calcifications as correlated with prior CT. 2.  The endometrium measures 6.9 mm in thickness in this patient who is post menopausal. Correlation with hormone replacement therapy necessary. In the setting of post-menopausal bleeding and no hormone replacement therapy, endometrial sampling is indicated to exclude carcinoma.  If results are benign, sonohysterogram should be considered for focal lesion work-up. (Ref:  Radiological Reasoning: Algorithmic Workup of Abnormal Vaginal Bleeding with Endovaginal Sonography and Sonohysterography. AJR 2008; 657:Q46-96) 3.  Neither ovary visualized.   Original Report Authenticated By: Gerrianne Scale, M.D.      1. Post-menopausal bleeding   2. Pelvic pain in female   3. Fibroids       MDM  Patient with calcified fibroids, postmenopausal bleeding and and pelvic pain.  Patient has obgyn follow up on Tuesday 10/29. Recommend endometrial biopsy for further evaluation. Patient given pain control. Discussed [place] results and labs.  All questions answered fully.  Discussed reasons to seek immediate care. Patient expresses understanding and agrees with plan.         Arthor Captain, PA-C 11/07/11 2016  Arthor Captain, PA-C 11/18/11 [phone]

## 2011-11-06 NOTE — ED Notes (Signed)
Pelvic cart is at the bedside set up and ready for the doctor to use. 

## 2011-11-06 NOTE — ED Notes (Signed)
abd stabbing since yesterday morning, took arthritis tylenol but no relief, last dose this morning.  Started vaginal bleeding Wednesday and primary MD set up appointment for gynecologist Tuesday. Instructed to come here for continued. Pain/bleeding Some nausea, no vomiting,

## 2011-11-08 NOTE — ED Provider Notes (Addendum)
Medical screening examination/treatment/procedure(s) were performed by non-physician practitioner and as supervising physician I was immediately available for consultation/collaboration.  Ethelda Chick, MD 11/08/11 1206  Medical screening examination/treatment/procedure(s) were performed by non-physician practitioner and as supervising physician I was immediately available for consultation/collaboration.  Ethelda Chick, MD 11/18/11 [phone]

## 2011-11-18 NOTE — ED Provider Notes (Signed)
Medical screening examination/treatment/procedure(s) were performed by non-physician practitioner and as supervising physician I was immediately available for consultation/collaboration.  Ethelda Chick, MD 11/18/11 [phone]

## 2012-02-27 ENCOUNTER — Emergency Department (HOSPITAL_BASED_OUTPATIENT_CLINIC_OR_DEPARTMENT_OTHER): Payer: Medicare Other

## 2012-02-27 ENCOUNTER — Encounter (HOSPITAL_BASED_OUTPATIENT_CLINIC_OR_DEPARTMENT_OTHER): Payer: Self-pay | Admitting: *Deleted

## 2012-02-27 ENCOUNTER — Emergency Department (HOSPITAL_BASED_OUTPATIENT_CLINIC_OR_DEPARTMENT_OTHER)
Admission: EM | Admit: 2012-02-27 | Discharge: 2012-02-27 | Disposition: A | Discharge status: Home / Self Care | Payer: Medicare Other | Attending: Emergency Medicine | Admitting: Emergency Medicine

## 2012-02-27 DIAGNOSIS — R319 Hematuria, unspecified: Secondary | ICD-10-CM | POA: Insufficient documentation

## 2012-02-27 DIAGNOSIS — Z86711 Personal history of pulmonary embolism: Secondary | ICD-10-CM | POA: Insufficient documentation

## 2012-02-27 DIAGNOSIS — I10 Essential (primary) hypertension: Secondary | ICD-10-CM | POA: Insufficient documentation

## 2012-02-27 DIAGNOSIS — Z79899 Other long term (current) drug therapy: Secondary | ICD-10-CM | POA: Insufficient documentation

## 2012-02-27 DIAGNOSIS — R109 Unspecified abdominal pain: Secondary | ICD-10-CM | POA: Insufficient documentation

## 2012-02-27 DIAGNOSIS — R011 Cardiac murmur, unspecified: Secondary | ICD-10-CM | POA: Insufficient documentation

## 2012-02-27 DIAGNOSIS — E079 Disorder of thyroid, unspecified: Secondary | ICD-10-CM | POA: Insufficient documentation

## 2012-02-27 LAB — URINE MICROSCOPIC-ADD ON

## 2012-02-27 LAB — URINALYSIS, ROUTINE W REFLEX MICROSCOPIC
Leukocytes, UA: NEGATIVE
Nitrite: NEGATIVE
Specific Gravity, Urine: 1.014 (ref 1.005–1.030)
Urobilinogen, UA: 0.2 mg/dL (ref 0.0–1.0)
pH: 6 (ref 5.0–8.0)

## 2012-02-27 MED ORDER — SODIUM CHLORIDE 0.9 % IV SOLN
Freq: Once | INTRAVENOUS | Status: AC
  Administered 2012-02-27: 15:00:00 via INTRAVENOUS

## 2012-02-27 MED ORDER — HYDROMORPHONE HCL PF 1 MG/ML IJ SOLN
1.0000 mg | Freq: Once | INTRAMUSCULAR | Status: AC
  Administered 2012-02-27: 1 mg via INTRAVENOUS
  Filled 2012-02-27: qty 1

## 2012-02-27 MED ORDER — SODIUM CHLORIDE 0.9 % IV BOLUS (SEPSIS)
1000.0000 mL | Freq: Once | INTRAVENOUS | Status: AC
  Administered 2012-02-27: 1000 mL via INTRAVENOUS

## 2012-02-27 MED ORDER — OXYCODONE-ACETAMINOPHEN 5-325 MG PO TABS: 1.0000 | ORAL_TABLET | ORAL | Status: DC | PRN

## 2012-02-27 MED ORDER — ONDANSETRON HCL 4 MG/2ML IJ SOLN
4.0000 mg | Freq: Once | INTRAMUSCULAR | Status: AC
  Administered 2012-02-27: 4 mg via INTRAVENOUS
  Filled 2012-02-27: qty 2

## 2012-02-27 NOTE — ED Provider Notes (Signed)
History     CSN: [id number]  Arrival date & time 02/27/12  1324   First MD Initiated Contact with Patient 02/27/12 1333      Chief Complaint  Patient presents with  . Back Pain    Patient is a 57 y.o. female presenting with flank pain. The history is provided by the patient.  Flank Pain This is a new problem. The current episode started 3 to 5 hours ago. The problem occurs constantly. The problem has been gradually worsening. Pertinent negatives include no chest pain and no shortness of breath. Exacerbated by: palpation. Nothing relieves the symptoms. She has tried rest for the symptoms. The treatment provided no relief.  PT reports abrupt onset of left flank pain She denies injury or fall.  No cp/sob.  No focal weakness.  She has never had this pain before.    Past Medical History  Diagnosis Date  . Hypertension   . Thyroid disease   . Heart murmur   . Pulmonary embolism     Past Surgical History  Procedure Laterality Date  . Colon surgery    . Ileostomy    . Ileostomy closure    . Cholecystectomy    . Hernia repair      No family history on file.  History  Substance Use Topics  . Smoking status: Never Smoker   . Smokeless tobacco: Not on file  . Alcohol Use: No    OB History   Grav Para Term Preterm Abortions TAB SAB Ect Mult Living                  Review of Systems  Constitutional: Negative for fever.  Respiratory: Negative for shortness of breath.   Cardiovascular: Negative for chest pain.  Genitourinary: Positive for flank pain.  All other systems reviewed and are negative.    Allergies  Ivp dye; Ace inhibitors; and Penicillins  Home Medications   Current Outpatient Rx  Name  Route  Sig  Dispense  Refill  . acetaminophen (TYLENOL) 325 MG tablet   Oral   Take 650 mg by mouth every 6 (six) hours as needed. Patient used this medication for her headache.         . colesevelam (WELCHOL) 625 MG tablet   Oral   Take 1,875 mg by mouth 2 (two)  times daily with a meal.         . dicyclomine (BENTYL) 20 MG tablet   Oral   Take 20 mg by mouth every 6 (six) hours.         Marland Kitchen esomeprazole (NEXIUM) 40 MG capsule   Oral   Take 40 mg by mouth daily before breakfast.         . hydrOXYzine (ATARAX/VISTARIL) 25 MG tablet   Oral   Take 25 mg by mouth 3 (three) times daily as needed.         Marland Kitchen levothyroxine (SYNTHROID, LEVOTHROID) 50 MCG tablet   Oral   Take 100 mcg by mouth daily.          . magnesium oxide (MAG-OX) 400 MG tablet   Oral   Take 400 mg by mouth daily.         . metoprolol succinate (TOPROL-XL) 25 MG 24 hr tablet   Oral   Take 25 mg by mouth daily.         . montelukast (SINGULAIR) 10 MG tablet   Oral   Take 10 mg by mouth at bedtime.         Marland Kitchen  oxyCODONE-acetaminophen (PERCOCET/ROXICET) 5-325 MG per tablet   Oral   Take 1-2 tablets by mouth every 4 (four) hours as needed for pain.   20 tablet   0   . promethazine (PHENERGAN) 25 MG tablet   Oral   Take 25 mg by mouth every 6 (six) hours as needed.         . venlafaxine XR (EFFEXOR-XR) 150 MG 24 hr capsule   Oral   Take 150 mg by mouth daily.         . Vitamin D, Ergocalciferol, (DRISDOL) 50000 UNITS CAPS   Oral   Take 50,000 Units by mouth every 7 (seven) days.           BP 191/113  Pulse 102  Temp(Src) 98.2 F (36.8 C) (Oral)  Resp 22  SpO2 100%  Physical Exam CONSTITUTIONAL: Well developed/well nourished HEAD AND FACE: Normocephalic/atraumatic EYES: EOMI/PERRL ENMT: Mucous membranes moist NECK: supple no meningeal signs SPINE:entire spine nontender, No bruising/crepitance/stepoffs noted to spine CV: S1/S2 noted, no murmurs/rubs/gallops noted LUNGS: Lungs are clear to auscultation bilaterally, no apparent distress ABDOMEN: soft, nontender, no rebound or guarding. Multiple well healed scars noted to abdominal wall.   JX:BJYN cva tenderness, no bruising noted NEURO: Pt is awake/alert, moves all  extremitiesx4 EXTREMITIES: pulses normal, full ROM SKIN: warm, color normal PSYCH: no abnormalities of mood noted  ED Course  Procedures (including critical care time)  Labs Reviewed  URINALYSIS, ROUTINE W REFLEX MICROSCOPIC   2:04 PM Will treat pain and reassess.   3:14 PM Pt with continued flank pain.  Hematuria noted.  Suspect this could be ureterolithiasis.   Will obtain CT imaging Pt agreeable 4:04 PM Ct shows hernia, but no signs of incarceration.  In fact her abdominal exam is benign and hernia is easily reducible.  She is well appearing. She still reports flank pain.  She already had labs done today as outpatient for f/u colonoscopy this week . She reports she knows she has hernias in her abdomen.  I doubt other acute abdominal process.    MDM  Nursing notes including past medical history and social history reviewed and considered in documentation Labs/vital reviewed and considered         Joya Gaskins, MD 02/27/12 1606

## 2012-02-27 NOTE — ED Notes (Signed)
Left flank pain x 3 hours.

## 2013-08-20 ENCOUNTER — Encounter (HOSPITAL_BASED_OUTPATIENT_CLINIC_OR_DEPARTMENT_OTHER): Payer: Self-pay | Admitting: Emergency Medicine

## 2013-08-20 DIAGNOSIS — E669 Obesity, unspecified: Secondary | ICD-10-CM | POA: Diagnosis not present

## 2013-08-20 DIAGNOSIS — Z87891 Personal history of nicotine dependence: Secondary | ICD-10-CM | POA: Insufficient documentation

## 2013-08-20 DIAGNOSIS — E039 Hypothyroidism, unspecified: Secondary | ICD-10-CM | POA: Insufficient documentation

## 2013-08-20 DIAGNOSIS — R011 Cardiac murmur, unspecified: Secondary | ICD-10-CM | POA: Insufficient documentation

## 2013-08-20 DIAGNOSIS — M5380 Other specified dorsopathies, site unspecified: Secondary | ICD-10-CM | POA: Insufficient documentation

## 2013-08-20 DIAGNOSIS — Z86711 Personal history of pulmonary embolism: Secondary | ICD-10-CM | POA: Insufficient documentation

## 2013-08-20 DIAGNOSIS — Z880 Allergy status to penicillin: Secondary | ICD-10-CM | POA: Insufficient documentation

## 2013-08-20 DIAGNOSIS — Z8719 Personal history of other diseases of the digestive system: Secondary | ICD-10-CM | POA: Insufficient documentation

## 2013-08-20 DIAGNOSIS — I10 Essential (primary) hypertension: Secondary | ICD-10-CM | POA: Diagnosis not present

## 2013-08-20 DIAGNOSIS — Z87442 Personal history of urinary calculi: Secondary | ICD-10-CM | POA: Diagnosis not present

## 2013-08-20 DIAGNOSIS — Z79899 Other long term (current) drug therapy: Secondary | ICD-10-CM | POA: Diagnosis not present

## 2013-08-20 DIAGNOSIS — M549 Dorsalgia, unspecified: Secondary | ICD-10-CM | POA: Diagnosis present

## 2013-08-20 NOTE — ED Notes (Signed)
Low back pain x1 week without known injury.  Worse today. Also having left upper arm pain that started today.

## 2013-08-21 ENCOUNTER — Emergency Department (HOSPITAL_BASED_OUTPATIENT_CLINIC_OR_DEPARTMENT_OTHER): Payer: Medicare Other

## 2013-08-21 ENCOUNTER — Emergency Department (HOSPITAL_BASED_OUTPATIENT_CLINIC_OR_DEPARTMENT_OTHER)
Admission: EM | Admit: 2013-08-21 | Discharge: 2013-08-21 | Disposition: A | Discharge status: Home / Self Care | Payer: Medicare Other | Attending: Emergency Medicine | Admitting: Emergency Medicine

## 2013-08-21 ENCOUNTER — Encounter (HOSPITAL_BASED_OUTPATIENT_CLINIC_OR_DEPARTMENT_OTHER): Payer: Self-pay | Admitting: Emergency Medicine

## 2013-08-21 DIAGNOSIS — M62830 Muscle spasm of back: Secondary | ICD-10-CM

## 2013-08-21 HISTORY — DX: Noninfective gastroenteritis and colitis, unspecified: K52.9

## 2013-08-21 HISTORY — DX: Calculus of kidney: N20.0

## 2013-08-21 HISTORY — DX: Short bowel syndrome, unspecified: K90.829

## 2013-08-21 HISTORY — DX: Obesity, unspecified: E66.9

## 2013-08-21 HISTORY — DX: Postsurgical malabsorption, not elsewhere classified: K91.2

## 2013-08-21 LAB — URINALYSIS, ROUTINE W REFLEX MICROSCOPIC
Bilirubin Urine: NEGATIVE
Glucose, UA: NEGATIVE mg/dL
Hgb urine dipstick: NEGATIVE
Ketones, ur: NEGATIVE mg/dL
NITRITE: NEGATIVE
PH: 6.5 (ref 5.0–8.0)
Protein, ur: NEGATIVE mg/dL
SPECIFIC GRAVITY, URINE: 1.02 (ref 1.005–1.030)
Urobilinogen, UA: 0.2 mg/dL (ref 0.0–1.0)

## 2013-08-21 LAB — URINE MICROSCOPIC-ADD ON

## 2013-08-21 MED ORDER — TRAMADOL HCL 50 MG PO TABS
ORAL_TABLET | ORAL | Status: AC
  Administered 2013-08-21: 50 mg
  Filled 2013-08-21: qty 1

## 2013-08-21 MED ORDER — DEXAMETHASONE SODIUM PHOSPHATE 4 MG/ML IJ SOLN
INTRAMUSCULAR | Status: AC
  Administered 2013-08-21: 4 mg via INTRAMUSCULAR
  Filled 2013-08-21: qty 1

## 2013-08-21 MED ORDER — KETOROLAC TROMETHAMINE 60 MG/2ML IM SOLN
INTRAMUSCULAR | Status: AC
  Administered 2013-08-21: 60 mg via INTRAMUSCULAR
  Filled 2013-08-21: qty 2

## 2013-08-21 MED ORDER — METHOCARBAMOL 500 MG PO TABS
ORAL_TABLET | ORAL | Status: AC
  Administered 2013-08-21: 1000 mg via ORAL
  Filled 2013-08-21: qty 2

## 2013-08-21 NOTE — ED Provider Notes (Signed)
CSN: [id number]     Arrival date & time 08/20/13  2318 History   First MD Initiated Contact with Patient 08/21/13 [phone]     Chief Complaint  Patient presents with  . Back Pain     (Consider location/radiation/quality/duration/timing/severity/associated sxs/prior Treatment) Patient is a 58 y.o. female presenting with back pain. The history is provided by the patient.  Back Pain Location:  Sacro-iliac joint Quality:  Aching Radiates to:  Does not radiate Pain severity:  Severe Pain is:  Same all the time Onset quality:  Gradual Timing:  Constant Progression:  Unchanged Chronicity:  New Context: physical stress   Context: not MCA and not recent illness   Relieved by:  Nothing Worsened by:  Nothing tried Ineffective treatments:  None tried Associated symptoms: no abdominal pain, no abdominal swelling, no bladder incontinence, no bowel incontinence, no chest pain, no dysuria, no fever, no headaches, no leg pain, no numbness, no paresthesias, no pelvic pain, no perianal numbness, no tingling, no weakness and no weight loss   Risk factors: no hx of osteoporosis     Past Medical History  Diagnosis Date  . Hypertension   . Thyroid disease   . Heart murmur   . Pulmonary embolism   . Obesity   . Short gut syndrome   . Kidney stone   . Chronic diarrhea    Past Surgical History  Procedure Laterality Date  . Colon surgery    . Ileostomy    . Ileostomy closure    . Cholecystectomy    . Hernia repair    . Wrist surgery    . Kidney stone surgery     History reviewed. No pertinent family history. History  Substance Use Topics  . Smoking status: Former Research scientist (life sciences)  . Smokeless tobacco: Not on file  . Alcohol Use: No   OB History   Grav Para Term Preterm Abortions TAB SAB Ect Mult Living                 Review of Systems  Constitutional: Negative for fever and weight loss.  Respiratory: Negative for shortness of breath.   Cardiovascular: Negative for chest pain, palpitations  and leg swelling.  Gastrointestinal: Negative for abdominal pain and bowel incontinence.  Genitourinary: Negative for bladder incontinence, dysuria and pelvic pain.  Musculoskeletal: Positive for back pain.  Neurological: Negative for tingling, weakness, numbness, headaches and paresthesias.  All other systems reviewed and are negative.     Allergies  Ivp dye; Ace inhibitors; and Penicillins  Home Medications   Prior to Admission medications   Medication Sig Start Date End Date Taking? Authorizing Provider  potassium citrate (UROCIT-K) 10 MEQ (1080 MG) SR tablet Take 120 mEq by mouth 4 (four) times daily.   Yes Historical Provider, MD  rifaximin (XIFAXAN) 200 MG tablet Take 200 mg by mouth 3 (three) times daily.   Yes Historical Provider, MD  acetaminophen (TYLENOL) 325 MG tablet Take 650 mg by mouth every 6 (six) hours as needed. Patient used this medication for her headache.    Historical Provider, MD  colesevelam (WELCHOL) 625 MG tablet Take 1,875 mg by mouth 2 (two) times daily with a meal.    Historical Provider, MD  dicyclomine (BENTYL) 20 MG tablet Take 20 mg by mouth every 6 (six) hours.    Historical Provider, MD  esomeprazole (NEXIUM) 40 MG capsule Take 40 mg by mouth daily before breakfast.    Historical Provider, MD  hydrOXYzine (ATARAX/VISTARIL) 25 MG tablet Take 25 mg  by mouth 3 (three) times daily as needed.    Historical Provider, MD  levothyroxine (SYNTHROID, LEVOTHROID) 50 MCG tablet Take 125 mcg by mouth daily.     Historical Provider, MD  magnesium oxide (MAG-OX) 400 MG tablet Take 400 mg by mouth daily.    Historical Provider, MD  metoprolol succinate (TOPROL-XL) 25 MG 24 hr tablet Take 25 mg by mouth daily.    Historical Provider, MD  montelukast (SINGULAIR) 10 MG tablet Take 10 mg by mouth at bedtime.    Historical Provider, MD  oxyCODONE-acetaminophen (PERCOCET/ROXICET) 5-325 MG per tablet Take 1 tablet by mouth every 4 (four) hours as needed for pain. 02/27/12    Sharyon Cable, MD  promethazine (PHENERGAN) 25 MG tablet Take 25 mg by mouth every 6 (six) hours as needed.    Historical Provider, MD  venlafaxine XR (EFFEXOR-XR) 150 MG 24 hr capsule Take 150 mg by mouth daily.    Historical Provider, MD  Vitamin D, Ergocalciferol, (DRISDOL) 50000 UNITS CAPS Take 50,000 Units by mouth every 7 (seven) days.    Historical Provider, MD   BP 149/87  Pulse 75  Temp(Src) 97.8 F (36.6 C) (Oral)  Resp 16  Ht 5\' 3"  (1.6 m)  Wt 266 lb (120.657 kg)  BMI 47.13 kg/m2  SpO2 100% Physical Exam  Constitutional: She is oriented to person, place, and time. She appears well-developed and well-nourished. No distress.  HENT:  Head: Normocephalic and atraumatic.  Mouth/Throat: Oropharynx is clear and moist.  Eyes: Conjunctivae are normal. Pupils are equal, round, and reactive to light.  Neck: Normal range of motion. Neck supple.  Cardiovascular: Normal rate, regular rhythm and intact distal pulses.   Pulmonary/Chest: Effort normal and breath sounds normal. She has no wheezes. She has no rales.  Abdominal: Soft. Bowel sounds are normal. There is no tenderness. There is no rebound and no guarding.  Musculoskeletal: Normal range of motion. She exhibits no edema and no tenderness.  Neurological: She is alert and oriented to person, place, and time. She has normal reflexes. She displays normal reflexes. No cranial nerve deficit. She exhibits normal muscle tone.  Intact Gait L5s1 intact intact perineal sensation.  Negative neers tests of the lue, FROM NVI left hand  Skin: Skin is warm and dry.  Psychiatric: She has a normal mood and affect.    ED Course  Procedures (including critical care time) Labs Review Labs Reviewed  URINALYSIS, ROUTINE W REFLEX MICROSCOPIC - Abnormal; Notable for the following:    Leukocytes, UA SMALL (*)    All other components within normal limits  URINE MICROSCOPIC-ADD ON - Abnormal; Notable for the following:    Squamous Epithelial / LPF  FEW (*)    Casts HYALINE CASTS (*)    All other components within normal limits    Imaging Review No results found.   EKG Interpretation None      MDM   Final diagnoses:  None   Spasms of low back nsaids muscle relaxants and pain medication and close follow up     K -Rasch, MD 08/21/13 [phone]

## 2014-06-01 DIAGNOSIS — E876 Hypokalemia: Secondary | ICD-10-CM | POA: Diagnosis present

## 2014-06-01 DIAGNOSIS — R7989 Other specified abnormal findings of blood chemistry: Secondary | ICD-10-CM | POA: Insufficient documentation

## 2014-06-01 DIAGNOSIS — K529 Noninfective gastroenteritis and colitis, unspecified: Secondary | ICD-10-CM | POA: Insufficient documentation

## 2014-07-25 DIAGNOSIS — E669 Obesity, unspecified: Secondary | ICD-10-CM | POA: Diagnosis present

## 2014-07-25 DIAGNOSIS — E66812 Obesity, class 2: Secondary | ICD-10-CM | POA: Diagnosis present

## 2014-11-15 DIAGNOSIS — Z87898 Personal history of other specified conditions: Secondary | ICD-10-CM | POA: Insufficient documentation

## 2015-04-28 DIAGNOSIS — Z860101 Personal history of adenomatous and serrated colon polyps: Secondary | ICD-10-CM | POA: Insufficient documentation

## 2015-05-04 DIAGNOSIS — I10 Essential (primary) hypertension: Secondary | ICD-10-CM | POA: Diagnosis present

## 2015-05-04 DIAGNOSIS — E559 Vitamin D deficiency, unspecified: Secondary | ICD-10-CM | POA: Insufficient documentation

## 2015-07-05 ENCOUNTER — Emergency Department (HOSPITAL_BASED_OUTPATIENT_CLINIC_OR_DEPARTMENT_OTHER): Payer: Medicare Other

## 2015-07-05 ENCOUNTER — Encounter (HOSPITAL_BASED_OUTPATIENT_CLINIC_OR_DEPARTMENT_OTHER): Payer: Self-pay | Admitting: Emergency Medicine

## 2015-07-05 ENCOUNTER — Emergency Department (HOSPITAL_BASED_OUTPATIENT_CLINIC_OR_DEPARTMENT_OTHER)
Admission: EM | Admit: 2015-07-05 | Discharge: 2015-07-05 | Disposition: A | Discharge status: Home / Self Care | Payer: Medicare Other | Attending: Emergency Medicine | Admitting: Emergency Medicine

## 2015-07-05 DIAGNOSIS — Z6841 Body Mass Index (BMI) 40.0 and over, adult: Secondary | ICD-10-CM | POA: Diagnosis not present

## 2015-07-05 DIAGNOSIS — R109 Unspecified abdominal pain: Secondary | ICD-10-CM

## 2015-07-05 DIAGNOSIS — G8929 Other chronic pain: Secondary | ICD-10-CM | POA: Diagnosis not present

## 2015-07-05 DIAGNOSIS — Z79899 Other long term (current) drug therapy: Secondary | ICD-10-CM | POA: Diagnosis not present

## 2015-07-05 DIAGNOSIS — Z87891 Personal history of nicotine dependence: Secondary | ICD-10-CM | POA: Insufficient documentation

## 2015-07-05 DIAGNOSIS — E669 Obesity, unspecified: Secondary | ICD-10-CM | POA: Diagnosis not present

## 2015-07-05 DIAGNOSIS — R197 Diarrhea, unspecified: Secondary | ICD-10-CM | POA: Diagnosis not present

## 2015-07-05 DIAGNOSIS — M549 Dorsalgia, unspecified: Secondary | ICD-10-CM | POA: Insufficient documentation

## 2015-07-05 DIAGNOSIS — I10 Essential (primary) hypertension: Secondary | ICD-10-CM | POA: Insufficient documentation

## 2015-07-05 LAB — COMPREHENSIVE METABOLIC PANEL
ALBUMIN: 3.8 g/dL (ref 3.5–5.0)
ALK PHOS: 90 U/L (ref 38–126)
ALT: 9 U/L — AB (ref 14–54)
ANION GAP: 10 (ref 5–15)
AST: 15 U/L (ref 15–41)
BUN: 25 mg/dL — ABNORMAL HIGH (ref 6–20)
CHLORIDE: 100 mmol/L — AB (ref 101–111)
CO2: 26 mmol/L (ref 22–32)
Calcium: 9 mg/dL (ref 8.9–10.3)
Creatinine, Ser: 0.97 mg/dL (ref 0.44–1.00)
GFR calc Af Amer: >60 mL/min (ref >60)
GFR calc non Af Amer: >60 mL/min (ref >60)
GLUCOSE: 91 mg/dL (ref 65–99)
POTASSIUM: 4 mmol/L (ref 3.5–5.1)
SODIUM: 136 mmol/L (ref 135–145)
Total Bilirubin: 0.3 mg/dL (ref 0.3–1.2)
Total Protein: 7.4 g/dL (ref 6.5–8.1)

## 2015-07-05 LAB — URINALYSIS, ROUTINE W REFLEX MICROSCOPIC
BILIRUBIN URINE: NEGATIVE
Glucose, UA: NEGATIVE mg/dL
HGB URINE DIPSTICK: NEGATIVE
Ketones, ur: NEGATIVE mg/dL
Leukocytes, UA: NEGATIVE
Nitrite: NEGATIVE
PH: 6 (ref 5.0–8.0)
Protein, ur: NEGATIVE mg/dL
SPECIFIC GRAVITY, URINE: 1.008 (ref 1.005–1.030)

## 2015-07-05 LAB — CBC
HEMATOCRIT: 34.3 % — AB (ref 36.0–46.0)
HEMOGLOBIN: 10.5 g/dL — AB (ref 12.0–15.0)
MCH: 24.2 pg — AB (ref 26.0–34.0)
MCHC: 30.6 g/dL (ref 30.0–36.0)
MCV: 79 fL (ref 78.0–100.0)
Platelets: 425 10*3/uL — ABNORMAL HIGH (ref 150–400)
RBC: 4.34 MIL/uL (ref 3.87–5.11)
RDW: 17 % — ABNORMAL HIGH (ref 11.5–15.5)
WBC: 8.2 10*3/uL (ref 4.0–10.5)

## 2015-07-05 LAB — LIPASE, BLOOD: Lipase: 25 U/L (ref 11–51)

## 2015-07-05 MED ORDER — ONDANSETRON HCL 4 MG/2ML IJ SOLN
4.0000 mg | Freq: Once | INTRAMUSCULAR | Status: DC
  Filled 2015-07-05: qty 2

## 2015-07-05 MED ORDER — MORPHINE SULFATE (PF) 4 MG/ML IV SOLN
4.0000 mg | Freq: Once | INTRAVENOUS | Status: AC
  Administered 2015-07-05: 4 mg via INTRAVENOUS
  Filled 2015-07-05: qty 1

## 2015-07-05 NOTE — ED Notes (Signed)
Patient states that she is having pain to her right side and pelvic region. Reports that she is having some nausea

## 2015-07-05 NOTE — ED Provider Notes (Signed)
CSN: [id number]     Arrival date & time 07/05/15  1337 History  By signing my name below, I, [name], attest that this documentation has been prepared under the direction and in the presence of [name], MD.  Electronically Signed: Reola [name], ED Scribe. 07/05/2015. 3:57 PM. Dr. Arletta Bale is her surgeon   Chief Complaint  Patient presents with  . Abdominal Pain   The history is provided by the patient. A language interpreter was used.   HPI Comments: [name] is a 60 y.o. female with a PMHx of HTN, heart murmur, PE, obesity, and multiple abdominal hernias, and chronic diarrhea who presents to the Emergency Department complaining of sudden onset, waxing and waning, aching, 9/10 right-sided upper abdominal and right-sided pelvic pain onset 3 days ago. Pt has had associated nausea. Pt reports that she was getting into bed and felt a ripping sensation inside of her stomach at the onset of her pain.  It seemed to ease off yesterday, but has since come back with a cramping sensation intermittently. Pt notes that she has had three abdominal hernia repairs in the past 8 years, all in the same site. She notes that during her second hernia repair that they also preformed a cholecystectomy. During her last hernia repair she developed an abdominal infection. Her pain today does not feel similar to her history of abdominal pain with her abdominal hernias. She notes that when she sits forward that her abdomen feels swollen. She has taken Excedrin with no relief of her symptoms. She has a history of chronic back pain, but denies any acute changes since the onset of her current abdominal pain. She also states that she has a history of chronic diarrhea, but has no new noted changes. Pt denies vomiting, dysuria, trouble urinating, fever, or chest pain.  Past Medical History  Diagnosis Date  . Hypertension   . Thyroid disease   . Heart murmur   . Pulmonary embolism ([place])   . Obesity    . Short gut syndrome   . Kidney stone   . Chronic diarrhea    Past Surgical History  Procedure Laterality Date  . Colon surgery    . Ileostomy    . Ileostomy closure    . Cholecystectomy    . Hernia repair    . Wrist surgery    . Kidney stone surgery     History reviewed. No pertinent family history. Social History  Substance Use Topics  . Smoking status: Former Research scientist (life sciences)  . Smokeless tobacco: None  . Alcohol Use: No   OB History    No data available     Review of Systems  Constitutional: Negative for fever.  Cardiovascular: Negative for chest pain.  Gastrointestinal: Positive for abdominal pain and diarrhea (chronic). Negative for vomiting.  Genitourinary: Negative for dysuria and difficulty urinating.  Musculoskeletal: Positive for back pain (chronic).  All other systems reviewed and are negative.  Allergies  Ivp dye; Ace inhibitors; Doxycycline; Penicillins; and Zofran  Home Medications   Prior to Admission medications   Medication Sig Start Date End Date Taking? Authorizing Provider  gabapentin (NEURONTIN) 300 MG capsule Take 300 mg by mouth 3 (three) times daily.   Yes Historical Provider, MD  potassium chloride (K-DUR,KLOR-CON) 10 MEQ tablet Take 10 mEq by mouth 2 (two) times daily.   Yes Historical Provider, MD  tizanidine (ZANAFLEX) 2 MG capsule Take 2 mg by mouth 3 (three) times daily.   Yes Historical Provider, MD  acetaminophen (TYLENOL) 325 MG tablet Take 650 mg by mouth every 6 (six) hours as needed. Patient used this medication for her headache.    Historical Provider, MD  colesevelam (WELCHOL) 625 MG tablet Take 1,875 mg by mouth 2 (two) times daily with a meal.    Historical Provider, MD  dicyclomine (BENTYL) 20 MG tablet Take 20 mg by mouth every 6 (six) hours.    Historical Provider, MD  esomeprazole (NEXIUM) 40 MG capsule Take 40 mg by mouth daily before breakfast.    Historical Provider, MD  hydrOXYzine (ATARAX/VISTARIL) 25 MG tablet Take 25 mg by  mouth 3 (three) times daily as needed.    Historical Provider, MD  levothyroxine (SYNTHROID, LEVOTHROID) 50 MCG tablet Take 125 mcg by mouth daily.     Historical Provider, MD  magnesium oxide (MAG-OX) 400 MG tablet Take 400 mg by mouth daily.    Historical Provider, MD  metoprolol succinate (TOPROL-XL) 25 MG 24 hr tablet Take 25 mg by mouth daily.    Historical Provider, MD  montelukast (SINGULAIR) 10 MG tablet Take 10 mg by mouth at bedtime.    Historical Provider, MD  oxyCODONE-acetaminophen (PERCOCET/ROXICET) 5-325 MG per tablet Take 1 tablet by mouth every 4 (four) hours as needed for pain. 02/27/12   [name], MD  potassium citrate (UROCIT-K) 10 MEQ (1080 MG) SR tablet Take 120 mEq by mouth 4 (four) times daily.    Historical Provider, MD  promethazine (PHENERGAN) 25 MG tablet Take 25 mg by mouth every 6 (six) hours as needed.    Historical Provider, MD  rifaximin (XIFAXAN) 200 MG tablet Take 200 mg by mouth 3 (three) times daily.    Historical Provider, MD  venlafaxine XR (EFFEXOR-XR) 150 MG 24 hr capsule Take 150 mg by mouth daily.    Historical Provider, MD  Vitamin D, Ergocalciferol, (DRISDOL) 50000 UNITS CAPS Take 50,000 Units by mouth every 7 (seven) days.    Historical Provider, MD   BP 128/75 mmHg  Pulse 61  Temp(Src) 98.1 F (36.7 C) (Oral)  Resp 16  Ht 5\' 3"  (1.6 m)  Wt 258 lb (117.028 kg)  BMI 45.71 kg/m2  SpO2 100%   Physical Exam  Constitutional: She is oriented to person, place, and time. She appears well-developed and well-nourished.  HENT:  Head: Normocephalic and atraumatic.  Right Ear: External ear normal.  Left Ear: External ear normal.  Nose: Nose normal.  Eyes: Conjunctivae are normal. Right eye exhibits no discharge. Left eye exhibits no discharge.  Cardiovascular: Normal rate and regular rhythm.   Murmur heard. Pulmonary/Chest: Effort normal and breath sounds normal.  Abdominal: Soft. There is tenderness. There is CVA tenderness (Right).  Pt has  right upper and mid-abdominal tenderness, there is a moderate size right mid-abdominal hernia with no evidence of incarceration, and is easily reducible.  Neurological: She is alert and oriented to person, place, and time.  Skin: Skin is warm and dry.  Nursing note and vitals reviewed.  ED Course  Procedures (including critical care time)  DIAGNOSTIC STUDIES: Oxygen Saturation is 100% on RA, normal by my interpretation.   COORDINATION OF CARE: 3:12 PM-Discussed next steps with pt including CT Abdomen Pelvis w/o contrast, and UA. Pt verbalized understanding and is agreeable with the plan.   Labs Review Labs Reviewed  COMPREHENSIVE METABOLIC PANEL - Abnormal; Notable for the following:    Chloride 100 (*)    BUN 25 (*)    ALT 9 (*)    All other components  within normal limits  CBC - Abnormal; Notable for the following:    Hemoglobin 10.5 (*)    HCT 34.3 (*)    MCH 24.2 (*)    RDW 17.0 (*)    Platelets 425 (*)    All other components within normal limits  LIPASE, BLOOD  URINALYSIS, ROUTINE W REFLEX MICROSCOPIC (NOT AT [facility])    Imaging Review Ct Abdomen Pelvis Wo Contrast  07/05/2015  CLINICAL DATA:  Sudden onset of abdominal pain, right-sided upper abdominal and right-sided pelvic pain onset 3 days ago. EXAM: CT ABDOMEN AND PELVIS WITHOUT CONTRAST TECHNIQUE: Multidetector CT imaging of the abdomen and pelvis was performed following the standard protocol without IV contrast. COMPARISON:  CT abdomen dated 01/30/2015. FINDINGS: Lower chest:  No acute findings. Hepatobiliary: Status post cholecystectomy. Small amount of pneumobilia within the left hepatic lobe. Liver otherwise unremarkable. Pancreas: Partially infiltrated with fat but otherwise unremarkable. Spleen: Mildly enlarged but otherwise unremarkable. Adrenals/Urinary Tract: Adrenal glands appear normal. Stable small cyst exophytic to the anterior cortex of the left kidney. No renal stone or hydronephrosis bilaterally. No  ureteral or bladder calculi identified. Bladder appears normal. Stomach/Bowel: Surgical changes in the right abdomen compatible with partial colon resection. There is mild distention of the small bowel just proximal to the anastomosis, measuring up to 3.5 cm diameter, but the more proximal small bowel is not distended. This configuration is similar to previous exams. Overall, no evidence of bowel obstruction. No bowel wall thickening or evidence of bowel wall inflammation seen. Stomach appears normal. Vascular/Lymphatic: Scattered atherosclerotic changes of the normal caliber abdominal aorta. No enlarged lymph nodes seen in the abdomen or pelvis. Reproductive: Multiple large calcified fibroids within the uterus. Adnexal regions are unremarkable. Other: No free fluid or abscess collections seen. No free intraperitoneal air. Musculoskeletal: Mild degenerative change in the lumbar spine. No acute or suspicious osseous lesion. Superficial soft tissues are unremarkable. IMPRESSION: 1. Overall, no acute findings within the abdomen or pelvis. No free fluid or inflammatory change. No evidence of acute solid organ abnormality. No evidence of bowel obstruction. 2. Surgical changes of a previous partial colon resection within the right abdomen. Small bowel proximal to the anastomosis is mildly distended, with a patulous appearance, but the more proximal small bowel is not distended and this configuration is similar to previous exams with no evidence of an associated bowel obstruction. 3. Leiomyomatous uterus. 4. Aortic atherosclerosis. 5. Status post cholecystectomy. Electronically Signed   By: Franki Cabot M.D.   On: 07/05/2015 16:02   I have personally reviewed and evaluated these images and lab results as part of my medical decision-making.  MDM   Final diagnoses:  Right sided abdominal pain    No obvious cause for the patient's right-sided abdominal pain. Clinically looks like she has a abdominal wall hernia but  patient states that her abdomen has always looked like that since surgery. CT is unremarkable besides chronic changes. Pain seems controlled. She feels ready for discharge. No concerning findings on blood work and urinalysis. Discussed return precautions, recommend she follow-up with her surgeon.  I personally performed the services described in this documentation, which was scribed in my presence. The recorded information has been reviewed and is accurate.     [name], MD 07/05/15 [phone]

## 2016-04-12 DIAGNOSIS — D509 Iron deficiency anemia, unspecified: Secondary | ICD-10-CM | POA: Insufficient documentation

## 2016-05-09 DIAGNOSIS — D509 Iron deficiency anemia, unspecified: Secondary | ICD-10-CM | POA: Diagnosis present

## 2016-07-25 DIAGNOSIS — E039 Hypothyroidism, unspecified: Secondary | ICD-10-CM | POA: Diagnosis present

## 2016-07-25 DIAGNOSIS — K219 Gastro-esophageal reflux disease without esophagitis: Secondary | ICD-10-CM | POA: Diagnosis present

## 2016-08-25 DIAGNOSIS — E790 Hyperuricemia without signs of inflammatory arthritis and tophaceous disease: Secondary | ICD-10-CM | POA: Insufficient documentation

## 2017-01-02 ENCOUNTER — Encounter (HOSPITAL_BASED_OUTPATIENT_CLINIC_OR_DEPARTMENT_OTHER): Payer: Self-pay | Admitting: Emergency Medicine

## 2017-01-02 ENCOUNTER — Other Ambulatory Visit: Payer: Self-pay

## 2017-01-02 ENCOUNTER — Emergency Department (HOSPITAL_BASED_OUTPATIENT_CLINIC_OR_DEPARTMENT_OTHER)
Admission: EM | Admit: 2017-01-02 | Discharge: 2017-01-02 | Disposition: A | Discharge status: Home / Self Care | Payer: Medicare Other | Attending: Emergency Medicine | Admitting: Emergency Medicine

## 2017-01-02 ENCOUNTER — Emergency Department (HOSPITAL_BASED_OUTPATIENT_CLINIC_OR_DEPARTMENT_OTHER): Payer: Medicare Other

## 2017-01-02 DIAGNOSIS — Y929 Unspecified place or not applicable: Secondary | ICD-10-CM | POA: Insufficient documentation

## 2017-01-02 DIAGNOSIS — I10 Essential (primary) hypertension: Secondary | ICD-10-CM | POA: Insufficient documentation

## 2017-01-02 DIAGNOSIS — Y999 Unspecified external cause status: Secondary | ICD-10-CM | POA: Diagnosis not present

## 2017-01-02 DIAGNOSIS — Z79899 Other long term (current) drug therapy: Secondary | ICD-10-CM | POA: Diagnosis not present

## 2017-01-02 DIAGNOSIS — Y939 Activity, unspecified: Secondary | ICD-10-CM | POA: Insufficient documentation

## 2017-01-02 DIAGNOSIS — S52121A Displaced fracture of head of right radius, initial encounter for closed fracture: Secondary | ICD-10-CM | POA: Insufficient documentation

## 2017-01-02 DIAGNOSIS — Z87891 Personal history of nicotine dependence: Secondary | ICD-10-CM | POA: Diagnosis not present

## 2017-01-02 DIAGNOSIS — W010XXA Fall on same level from slipping, tripping and stumbling without subsequent striking against object, initial encounter: Secondary | ICD-10-CM | POA: Diagnosis not present

## 2017-01-02 DIAGNOSIS — S59801A Other specified injuries of right elbow, initial encounter: Secondary | ICD-10-CM | POA: Diagnosis present

## 2017-01-02 MED ORDER — HYDROCODONE-ACETAMINOPHEN 5-325 MG PO TABS
2.0000 | ORAL_TABLET | Freq: Once | ORAL | Status: AC
  Administered 2017-01-02: 2 via ORAL
  Filled 2017-01-02: qty 2

## 2017-01-02 NOTE — ED Provider Notes (Addendum)
[facility] EMERGENCY DEPARTMENT Provider Note   CSN: [id number] Arrival date & time: 01/02/17  1151     History   Chief Complaint Chief Complaint  Patient presents with  . Fall    HPI [name] is a 61 y.o. female.  HPI 61 year old female who presents after fall. History of PE but no longer anticoagulated, HTN and short gut syndrome. Tripped while going up the front porch steps last night. [place] [address] forward onto an outstretched right hand and onto bilateral knees. Complains of severe right elbow pain. No head injury or LOC. Denies numbness or weakness, recent illness. Took 10 mg home hydrocodone without improvement in her symptoms.  Past Medical History:  Diagnosis Date  . Chronic diarrhea   . Heart murmur   . Hypertension   . Kidney stone   . Obesity   . Pulmonary embolism ([place])   . Short gut syndrome   . Thyroid disease     There are no active problems to display for this patient.   Past Surgical History:  Procedure Laterality Date  . CHOLECYSTECTOMY    . COLON SURGERY    . HERNIA REPAIR    . ILEOSTOMY    . ILEOSTOMY CLOSURE    . KIDNEY STONE SURGERY    . WRIST SURGERY      OB History    No data available       Home Medications    Prior to Admission medications   Medication Sig Start Date End Date Taking? Authorizing Provider  acetaminophen (TYLENOL) 325 MG tablet Take 650 mg by mouth every 6 (six) hours as needed. Patient used this medication for her headache.    [provider]  colesevelam (WELCHOL) 625 MG tablet Take 1,875 mg by mouth 2 (two) times daily with a meal.    [provider]  dicyclomine (BENTYL) 20 MG tablet Take 20 mg by mouth every 6 (six) hours.    [provider]  esomeprazole (NEXIUM) 40 MG capsule Take 40 mg by mouth daily before breakfast.    [provider]  gabapentin (NEURONTIN) 300 MG capsule Take 300 mg by mouth 3 (three) times daily.    [provider]  hydrOXYzine  (ATARAX/VISTARIL) 25 MG tablet Take 25 mg by mouth 3 (three) times daily as needed.    [provider]  levothyroxine (SYNTHROID, LEVOTHROID) 50 MCG tablet Take 125 mcg by mouth daily.     [provider]  magnesium oxide (MAG-OX) 400 MG tablet Take 400 mg by mouth daily.    [provider]  metoprolol succinate (TOPROL-XL) 25 MG 24 hr tablet Take 25 mg by mouth daily.    [provider]  montelukast (SINGULAIR) 10 MG tablet Take 10 mg by mouth at bedtime.    [provider]  oxyCODONE-acetaminophen (PERCOCET/ROXICET) 5-325 MG per tablet Take 1 tablet by mouth every 4 (four) hours as needed for pain. 02/27/12   [name], MD  potassium chloride (K-DUR,KLOR-CON) 10 MEQ tablet Take 10 mEq by mouth 2 (two) times daily.    [provider]  potassium citrate (UROCIT-K) 10 MEQ (1080 MG) SR tablet Take 120 mEq by mouth 4 (four) times daily.    [provider]  promethazine (PHENERGAN) 25 MG tablet Take 25 mg by mouth every 6 (six) hours as needed.    [provider]  rifaximin (XIFAXAN) 200 MG tablet Take 200 mg by mouth 3 (three) times daily.    [provider]  tizanidine (ZANAFLEX) 2 MG capsule Take 2 mg by mouth 3 (three) times daily.    [provider]  venlafaxine XR (EFFEXOR-XR) 150 MG 24 hr capsule Take 150 mg by mouth daily.    [provider]  Vitamin D, Ergocalciferol, (DRISDOL) 50000 UNITS CAPS Take 50,000 Units by mouth every 7 (seven) days.    [provider]    Family History History reviewed. No pertinent family history.  Social History Social History   Tobacco Use  . Smoking status: Former Research scientist (life sciences)  . Smokeless tobacco: Never Used  Substance Use Topics  . Alcohol use: No  . Drug use: No     Allergies   Ivp dye [iodinated diagnostic agents]; Ace inhibitors; Doxycycline; Penicillins; and Zofran [ondansetron]   Review of Systems Review of Systems  Constitutional:  Negative for fever.  Respiratory: Negative for shortness of breath.   Gastrointestinal: Negative for abdominal pain.  Musculoskeletal:       Right elbow pain   Skin: Negative for wound.  Neurological: Negative for weakness and numbness.  Hematological: Does not bruise/bleed easily.  Psychiatric/Behavioral: Negative for confusion.     Physical Exam Updated Vital Signs BP (!) 176/107 (BP Location: Left Arm)   Pulse 96   Temp 98.6 F (37 C) (Oral)   Resp 16   Ht 5\' 3"  (1.6 m)   Wt 130.6 kg (288 lb)   SpO2 98%   BMI 51.02 kg/m   Physical Exam Physical Exam  Constitutional: Appears well-developed and well-nourished. No acute distress. HENT:  Head: Normocephalic.  Eyes: Conjunctivae are normal.  Neck: Supple, normal range of motion Cardiovascular: Normal rate and intact distal pulses.  +2 radial pulses Pulmonary/Chest: Effort normal. No respiratory distress.  Abdominal: Exhibits no distension. No tenderness to palpation Musculoskeletal: Limited ROM of the right elbow due to pain. No obvious deformity or bruising.  Neurological: Alert. Fluent speech. In tact innervation of the radial, ulnar, and median nerves of the distal right arm Skin: Skin is warm and dry.  Psychiatric: Normal mood and affect. Behavior is normal.  Nursing note and vitals reviewed.   ED Treatments / Results  Labs (all labs ordered are listed, but only abnormal results are displayed) Labs Reviewed - No data to display  EKG  EKG Interpretation None       Radiology Dg Elbow Complete Right  Result Date: 01/02/2017 CLINICAL DATA:  [place] yesterday with arm outstretched, RIGHT elbow pain EXAM: RIGHT ELBOW - COMPLETE 3+ VIEW COMPARISON:  None FINDINGS: Osseous demineralization. Joint spaces preserved. Minimally displaced intra-articular fracture of the RIGHT radial head. No additional fracture, dislocation, or bone destruction. Associated elbow joint effusion. IMPRESSION: Displaced intra-articular  fracture of RIGHT radial head with associated elbow joint effusion. Electronically Signed   By: Lavonia Dana M.D.   On: 01/02/2017 12:14    Procedures Procedures (including critical care time) SPLINT APPLICATION Date/Time: 5:73 PM Authorized by: Forde Dandy Consent: Verbal consent obtained. Risks and benefits: risks, benefits and alternatives were discussed Consent given by: patient Splint applied by: technician Location details: right arm Splint type: posterior long arm Supplies used: fiberglass Post-procedure: The splinted body part was neurovascularly unchanged following the procedure. Patient tolerance: Patient tolerated the procedure well with no immediate complications.    Medications Ordered in ED Medications  HYDROcodone-acetaminophen (NORCO/VICODIN) 5-325 MG per tablet 2 tablet (2 tablets Oral Given 01/02/17 1256)     Initial Impression / Assessment and Plan / ED Course  I have reviewed the triage vital signs  and the nursing notes.  Pertinent labs & imaging results that were available during my care of the patient were reviewed by me and considered in my medical decision making (see chart for details).     Presents after mechanical fall onto outward stretch right hand.  Extremity is neurovascularly intact.  X-ray visualized and shows a radial head fracture with intra-articular spread and displacement.  Patient is placed in a posterior long-arm elbow splint.  Discussed with Dr. Burney Gauze who follow-up with this patient in his clinic.  Patient does have home hydrocodone already for pain control. Strict return and follow-up instructions reviewed. She expressed understanding of all discharge instructions and felt comfortable with the plan of care.   Final Clinical Impressions(s) / ED Diagnoses   Final diagnoses:  Closed displaced fracture of head of right radius, initial encounter    ED Discharge Orders    None       Forde Dandy, MD 01/02/17 1340    Forde Dandy, MD 01/02/17 1344    Forde Dandy, MD 01/02/17 1344

## 2017-01-02 NOTE — ED Triage Notes (Signed)
Patient states that she was walking up the stairs to get to bed and she tripped and fell - hurt her right elbow

## 2017-01-02 NOTE — Discharge Instructions (Signed)
You do have an elbow fracture and it is placed in splint.  Please keep your elbow in the sling at all times.  Please call Dr. Bertis Ruddy office for follow-up.  He states that he should be able to get you in the office on Thursday for follow-up.  Continue to take your home hydrocodone for pain control.  Return without fail for worsening symptoms, including numbness or weakness of the hand, escalating pain, or any other symptoms concerning to you.

## 2017-01-02 NOTE — ED Notes (Signed)
Paged Hand Surgeon (Dr. Burney Gauze) for consult @ 13:20

## 2018-09-10 DIAGNOSIS — F331 Major depressive disorder, recurrent, moderate: Secondary | ICD-10-CM

## 2018-09-10 HISTORY — DX: Major depressive disorder, recurrent, moderate: F33.1

## 2018-10-11 HISTORY — PX: INCISIONAL HERNIA REPAIR: SHX193

## 2019-01-19 ENCOUNTER — Emergency Department (HOSPITAL_BASED_OUTPATIENT_CLINIC_OR_DEPARTMENT_OTHER): Payer: Medicare Other

## 2019-01-19 ENCOUNTER — Other Ambulatory Visit: Payer: Self-pay

## 2019-01-19 ENCOUNTER — Emergency Department (HOSPITAL_BASED_OUTPATIENT_CLINIC_OR_DEPARTMENT_OTHER)
Admission: EM | Admit: 2019-01-19 | Discharge: 2019-01-19 | Disposition: A | Discharge status: Home / Self Care | Payer: Medicare Other | Attending: Emergency Medicine | Admitting: Emergency Medicine

## 2019-01-19 ENCOUNTER — Encounter (HOSPITAL_BASED_OUTPATIENT_CLINIC_OR_DEPARTMENT_OTHER): Payer: Self-pay | Admitting: Emergency Medicine

## 2019-01-19 DIAGNOSIS — R5383 Other fatigue: Secondary | ICD-10-CM | POA: Diagnosis present

## 2019-01-19 DIAGNOSIS — Z86711 Personal history of pulmonary embolism: Secondary | ICD-10-CM | POA: Insufficient documentation

## 2019-01-19 DIAGNOSIS — R7989 Other specified abnormal findings of blood chemistry: Secondary | ICD-10-CM

## 2019-01-19 DIAGNOSIS — I10 Essential (primary) hypertension: Secondary | ICD-10-CM | POA: Diagnosis not present

## 2019-01-19 DIAGNOSIS — E559 Vitamin D deficiency, unspecified: Secondary | ICD-10-CM | POA: Diagnosis not present

## 2019-01-19 DIAGNOSIS — R531 Weakness: Secondary | ICD-10-CM | POA: Insufficient documentation

## 2019-01-19 DIAGNOSIS — E876 Hypokalemia: Secondary | ICD-10-CM | POA: Insufficient documentation

## 2019-01-19 DIAGNOSIS — R410 Disorientation, unspecified: Secondary | ICD-10-CM | POA: Insufficient documentation

## 2019-01-19 DIAGNOSIS — Z79899 Other long term (current) drug therapy: Secondary | ICD-10-CM | POA: Insufficient documentation

## 2019-01-19 DIAGNOSIS — E079 Disorder of thyroid, unspecified: Secondary | ICD-10-CM | POA: Insufficient documentation

## 2019-01-19 LAB — URINALYSIS, ROUTINE W REFLEX MICROSCOPIC
Bilirubin Urine: NEGATIVE
Glucose, UA: NEGATIVE mg/dL
Ketones, ur: NEGATIVE mg/dL
Leukocytes,Ua: NEGATIVE
Nitrite: NEGATIVE
Protein, ur: NEGATIVE mg/dL
Specific Gravity, Urine: >1.03 — ABNORMAL HIGH (ref 1.005–1.030)
pH: 6 (ref 5.0–8.0)

## 2019-01-19 LAB — BASIC METABOLIC PANEL
Anion gap: 11 (ref 5–15)
BUN: 17 mg/dL (ref 8–23)
CO2: 23 mmol/L (ref 22–32)
Calcium: 8.7 mg/dL — ABNORMAL LOW (ref 8.9–10.3)
Chloride: 105 mmol/L (ref 98–111)
Creatinine, Ser: 0.74 mg/dL (ref 0.44–1.00)
GFR calc Af Amer: >60 mL/min (ref >60)
GFR calc non Af Amer: >60 mL/min (ref >60)
Glucose, Bld: 129 mg/dL — ABNORMAL HIGH (ref 70–99)
Potassium: 3.1 mmol/L — ABNORMAL LOW (ref 3.5–5.1)
Sodium: 139 mmol/L (ref 135–145)

## 2019-01-19 LAB — CBC
HCT: 37.7 % (ref 36.0–46.0)
Hemoglobin: 11.6 g/dL — ABNORMAL LOW (ref 12.0–15.0)
MCH: 25.1 pg — ABNORMAL LOW (ref 26.0–34.0)
MCHC: 30.8 g/dL (ref 30.0–36.0)
MCV: 81.6 fL (ref 80.0–100.0)
Platelets: 261 10*3/uL (ref 150–400)
RBC: 4.62 MIL/uL (ref 3.87–5.11)
RDW: 16 % — ABNORMAL HIGH (ref 11.5–15.5)
WBC: 5.3 10*3/uL (ref 4.0–10.5)
nRBC: 0 % (ref 0.0–0.2)

## 2019-01-19 LAB — HEPATIC FUNCTION PANEL
ALT: 25 U/L (ref 0–44)
AST: 39 U/L (ref 15–41)
Albumin: 3.6 g/dL (ref 3.5–5.0)
Alkaline Phosphatase: 101 U/L (ref 38–126)
Bilirubin, Direct: 0.2 mg/dL (ref 0.0–0.2)
Indirect Bilirubin: 0.4 mg/dL (ref 0.3–0.9)
Total Bilirubin: 0.6 mg/dL (ref 0.3–1.2)
Total Protein: 7.9 g/dL (ref 6.5–8.1)

## 2019-01-19 LAB — URINALYSIS, MICROSCOPIC (REFLEX)

## 2019-01-19 NOTE — ED Notes (Signed)
Pt ambulated to treatment room with steady gait

## 2019-01-19 NOTE — ED Triage Notes (Addendum)
Pt here with fatigue for 2 weeks. Can eat and drink and denies other symptoms. Pt accompanied by son bc of AMS for many months. States Vit D is 18 and refuses to take Rx Vit D.

## 2019-01-19 NOTE — ED Notes (Signed)
Per son- pt has had increased fatigue and has been confused when waking up in AM. Pt recently stopped taking vit d and substituted with [employer] vitamins. Pt reports normal appetite. Denies sob/chestpain/n/v/d. Pt a/o x4.

## 2019-01-19 NOTE — ED Notes (Signed)
Pt provided water at this time.

## 2019-01-19 NOTE — Discharge Instructions (Signed)
Follow-up with the nephrologist and GI doctor as scheduled at your appointment next week. Continue to take all your medications as prescribed, including the vitamin D. Make sure you are eating foods with high levels of potassium. Return to the emergency room with any new, worsening, or concerning symptoms.

## 2019-01-19 NOTE — ED Provider Notes (Signed)
[facility] EMERGENCY DEPARTMENT Provider Note   CSN: [id number] Arrival date & time: 01/19/19  1136     History Chief Complaint  Patient presents with  . Fatigue    [name] is a 64 y.o. female presenting for evaluation of fatigue.  History provided most by patient's son per patient request.  Son states of the past 4 days, patient has been more tired than normal.  He states when she first wakes up, she is "speaking out of her head."  This improves as she wakes up more.  He states she was recently restarted on blood pressure medication about 2 weeks ago.  Other than that, no other medication changes.  Patient recently told him that she is not taking her vitamin D or iron pills.   Patient denies recent fevers, chills, cough, chest pain, shortness of breath, nausea, vomiting, domino pain, urinary symptoms.  Additional history obtained from chart review.  Patient seen by PCP recently, found to have a positive FIT stool test and spinal megaly, referred to GI.  Additionally, patient with chronic mild IDA.  Most recent vitamin D levels were lower than recommended.   HPI     Past Medical History:  Diagnosis Date  . Chronic diarrhea   . Heart murmur   . Hypertension   . Kidney stone   . Obesity   . Pulmonary embolism ([place])   . Short gut syndrome   . Thyroid disease     There are no problems to display for this patient.   Past Surgical History:  Procedure Laterality Date  . CHOLECYSTECTOMY    . COLON SURGERY    . HERNIA REPAIR    . ILEOSTOMY    . ILEOSTOMY CLOSURE    . KIDNEY STONE SURGERY    . WRIST SURGERY       OB History   No obstetric history on file.     History reviewed. No pertinent family history.  Social History   Tobacco Use  . Smoking status: Former Research scientist (life sciences)  . Smokeless tobacco: Never Used  Substance Use Topics  . Alcohol use: No  . Drug use: No    Home Medications Prior to Admission medications   Medication Sig Start Date End  Date Taking? Authorizing Provider  acetaminophen (TYLENOL) 325 MG tablet Take 650 mg by mouth every 6 (six) hours as needed. Patient used this medication for her headache.    [provider]  colesevelam (WELCHOL) 625 MG tablet Take 1,875 mg by mouth 2 (two) times daily with a meal.    [provider]  dicyclomine (BENTYL) 20 MG tablet Take 20 mg by mouth every 6 (six) hours.    [provider]  esomeprazole (NEXIUM) 40 MG capsule Take 40 mg by mouth daily before breakfast.    [provider]  gabapentin (NEURONTIN) 300 MG capsule Take 300 mg by mouth 3 (three) times daily.    [provider]  hydrOXYzine (ATARAX/VISTARIL) 25 MG tablet Take 25 mg by mouth 3 (three) times daily as needed.    [provider]  levothyroxine (SYNTHROID, LEVOTHROID) 50 MCG tablet Take 125 mcg by mouth daily.     [provider]  magnesium oxide (MAG-OX) 400 MG tablet Take 400 mg by mouth daily.    [provider]  metoprolol succinate (TOPROL-XL) 25 MG 24 hr tablet Take 25 mg by mouth daily.    [provider]  montelukast (SINGULAIR) 10 MG tablet Take 10 mg by mouth at  bedtime.    [provider]  oxyCODONE-acetaminophen (PERCOCET/ROXICET) 5-325 MG per tablet Take 1 tablet by mouth every 4 (four) hours as needed for pain. 02/27/12   [name], MD  potassium chloride (K-DUR,KLOR-CON) 10 MEQ tablet Take 10 mEq by mouth 2 (two) times daily.    [provider]  potassium citrate (UROCIT-K) 10 MEQ (1080 MG) SR tablet Take 120 mEq by mouth 4 (four) times daily.    [provider]  promethazine (PHENERGAN) 25 MG tablet Take 25 mg by mouth every 6 (six) hours as needed.    [provider]  rifaximin (XIFAXAN) 200 MG tablet Take 200 mg by mouth 3 (three) times daily.    [provider]  tizanidine (ZANAFLEX) 2 MG capsule Take 2 mg by mouth 3 (three) times daily.    [provider]    venlafaxine XR (EFFEXOR-XR) 150 MG 24 hr capsule Take 150 mg by mouth daily.    [provider]  Vitamin D, Ergocalciferol, (DRISDOL) 50000 UNITS CAPS Take 50,000 Units by mouth every 7 (seven) days.    [provider]    Allergies    Ivp dye [iodinated diagnostic agents], Ace inhibitors, Doxycycline, Penicillins, and Zofran [ondansetron]  Review of Systems   Review of Systems  Constitutional: Positive for fatigue.  Psychiatric/Behavioral: Positive for confusion (intermittent ).  All other systems reviewed and are negative.   Physical Exam Updated Vital Signs BP 128/82   Pulse 87   Temp 98.1 F (36.7 C) (Oral)   Resp (!) 21   SpO2 100%   Physical Exam Vitals and nursing note reviewed.  Constitutional:      General: She is not in acute distress.    Appearance: She is well-developed. She is obese.     Comments: Resting comfortably in the bed in no acute distress.  Nontoxic.  HENT:     Head: Normocephalic and atraumatic.  Eyes:     Extraocular Movements: Extraocular movements intact.     Conjunctiva/sclera: Conjunctivae normal.     Pupils: Pupils are equal, round, and reactive to light.  Cardiovascular:     Rate and Rhythm: Normal rate and regular rhythm.     Pulses: Normal pulses.  Pulmonary:     Effort: Pulmonary effort is normal. No respiratory distress.     Breath sounds: Normal breath sounds. No wheezing.     Comments: Clear lung sounds in all fields Abdominal:     General: There is no distension.     Palpations: Abdomen is soft. There is no mass.     Tenderness: There is no abdominal tenderness. There is no guarding or rebound.  Musculoskeletal:        General: Normal range of motion.     Cervical back: Normal range of motion and neck supple.  Skin:    General: Skin is warm and dry.     Capillary Refill: Capillary refill takes less than 2 seconds.  Neurological:     Mental Status: She is alert and oriented to person, place, and time.      GCS: GCS eye subscore is 4. GCS verbal subscore is 5. GCS motor subscore is 6.     Sensory: Sensation is intact.     Motor: Motor function is intact.     Comments: Patient is alert and oriented.  No confusion at this time.  Son states patient is not confused at this time either.     ED Results / Procedures / Treatments  Labs (all labs ordered are listed, but only abnormal results are displayed) Labs Reviewed  BASIC METABOLIC PANEL - Abnormal; Notable for the following components:      Result Value   Potassium 3.1 (*)    Glucose, Bld 129 (*)    Calcium 8.7 (*)    All other components within normal limits  CBC - Abnormal; Notable for the following components:   Hemoglobin 11.6 (*)    MCH 25.1 (*)    RDW 16.0 (*)    All other components within normal limits  URINALYSIS, ROUTINE W REFLEX MICROSCOPIC - Abnormal; Notable for the following components:   Specific Gravity, Urine >1.030 (*)    Hgb urine dipstick TRACE (*)    All other components within normal limits  URINALYSIS, MICROSCOPIC (REFLEX) - Abnormal; Notable for the following components:   Bacteria, UA FEW (*)    All other components within normal limits  HEPATIC FUNCTION PANEL    EKG None  Radiology DG Chest 2 View  Result Date: 01/19/2019 CLINICAL DATA:  Weakness and cough EXAM: CHEST - 2 VIEW COMPARISON:  Chest radiograph dated 04/27/2016. FINDINGS: The heart size is within normal limits. Vascular calcifications are seen in the aortic arch. Both lungs are clear. The visualized skeletal structures are unremarkable. IMPRESSION: No active cardiopulmonary disease. Electronically Signed   By: Zerita Boers M.D.   On: 01/19/2019 15:56   CT Head Wo Contrast  Result Date: 01/19/2019 CLINICAL DATA:  Encephalopathy. Fatigue for 2 weeks. EXAM: CT HEAD WITHOUT CONTRAST TECHNIQUE: Contiguous axial images were obtained from the base of the skull through the vertex without intravenous contrast. COMPARISON:  CT head dated 03/29/2016  FINDINGS: Brain: No evidence of acute infarction, hemorrhage, hydrocephalus, extra-axial collection or mass lesion/mass effect. Vascular: There are vascular calcifications in the carotid siphons. Skull: Normal. Negative for fracture or focal lesion. Sinuses/Orbits: No acute finding. Other: None. IMPRESSION: No acute intracranial process. Electronically Signed   By: Zerita Boers M.D.   On: 01/19/2019 15:47    Procedures Procedures (including critical care time)  Medications Ordered in ED Medications - No data to display  ED Course  I have reviewed the triage vital signs and the nursing notes.  Pertinent labs & imaging results that were available during my care of the patient were reviewed by me and considered in my medical decision making (see chart for details).    MDM Rules/Calculators/A&P                      Patient presenting for evaluation of generalized weakness and fatigue.  Physical exam shows patient appears nontoxic.  She is not confused at this time.  Son states confusion is only when she first wakes up, likely due to drowsiness.  Labs obtained from PCP have shown low vitamin D, may be contributing to fatigue.  Additionally, patient has recently had a positive FIT stool test and splenomegaly, being referred to GI, has an appointment next week.  Will obtain labs to ensure no acute electrolyte abnormality, UA to rule out infection, chest x-ray to assess for infection, and CT head due to confusion.  As patient is alert and oriented at this time, and able to ambulate, if results are reassuring I do not believe she will need admission.  Labs overall reassuring.  Mild hypokalemia at 3.1, patient takes potassium at home.  I discussed importance of taking her potassium pill and eating regular meals.  Otherwise labs are reassuring.  Mild anemia, at her  baseline.  Chest x-ray viewed interpreted by me, no pneumonia, pneumothorax, effusion, cardiomegaly.  Urine without infection.  CT head without  acute findings.  Discussed findings with patient and son.  Discussed importance of taking vitamin D as prescribed.  Discussed importance of following up with nephrology and GI next week at their scheduled appointments.  At this time, patient appears safe for discharge.  Return precautions given.  Patient states she understands and agrees to plan.  Final Clinical Impression(s) / ED Diagnoses Final diagnoses:  Transient confusion  Fatigue, unspecified type  Hypokalemia  Low vitamin D level    Rx / DC Orders ED Discharge Orders    None       Franchot Heidelberg, PA-C 01/19/19 Kai Levins, MD 01/19/19 2132

## 2019-01-21 DIAGNOSIS — E538 Deficiency of other specified B group vitamins: Secondary | ICD-10-CM | POA: Insufficient documentation

## 2019-02-06 DIAGNOSIS — R161 Splenomegaly, not elsewhere classified: Secondary | ICD-10-CM | POA: Insufficient documentation

## 2019-04-02 HISTORY — PX: ESOPHAGOSCOPY: SUR460

## 2019-04-02 HISTORY — PX: COLONOSCOPY: SHX174

## 2020-03-06 DIAGNOSIS — K439 Ventral hernia without obstruction or gangrene: Secondary | ICD-10-CM | POA: Insufficient documentation

## 2020-03-06 HISTORY — DX: Ventral hernia without obstruction or gangrene: K43.9

## 2020-08-20 DIAGNOSIS — M51369 Other intervertebral disc degeneration, lumbar region without mention of lumbar back pain or lower extremity pain: Secondary | ICD-10-CM | POA: Insufficient documentation

## 2020-08-20 DIAGNOSIS — I672 Cerebral atherosclerosis: Secondary | ICD-10-CM

## 2020-08-20 DIAGNOSIS — K90829 Short bowel syndrome, unspecified: Secondary | ICD-10-CM | POA: Diagnosis present

## 2020-08-20 HISTORY — DX: Cerebral atherosclerosis: I67.2

## 2020-09-10 DIAGNOSIS — H179 Unspecified corneal scar and opacity: Secondary | ICD-10-CM | POA: Insufficient documentation

## 2020-10-01 DIAGNOSIS — H25811 Combined forms of age-related cataract, right eye: Secondary | ICD-10-CM | POA: Insufficient documentation

## 2020-10-06 HISTORY — PX: EYE SURGERY: SHX253

## 2021-01-06 DIAGNOSIS — Z87891 Personal history of nicotine dependence: Secondary | ICD-10-CM | POA: Insufficient documentation

## 2021-01-11 ENCOUNTER — Emergency Department (HOSPITAL_BASED_OUTPATIENT_CLINIC_OR_DEPARTMENT_OTHER): Payer: Medicare (Managed Care)

## 2021-01-11 ENCOUNTER — Emergency Department (HOSPITAL_BASED_OUTPATIENT_CLINIC_OR_DEPARTMENT_OTHER)
Admission: EM | Admit: 2021-01-11 | Discharge: 2021-01-11 | Disposition: A | Discharge status: Home / Self Care | Payer: Medicare (Managed Care) | Attending: Emergency Medicine | Admitting: Emergency Medicine

## 2021-01-11 ENCOUNTER — Encounter (HOSPITAL_BASED_OUTPATIENT_CLINIC_OR_DEPARTMENT_OTHER): Payer: Self-pay | Admitting: Urology

## 2021-01-11 ENCOUNTER — Other Ambulatory Visit: Payer: Self-pay

## 2021-01-11 DIAGNOSIS — D649 Anemia, unspecified: Secondary | ICD-10-CM | POA: Insufficient documentation

## 2021-01-11 DIAGNOSIS — R790 Abnormal level of blood mineral: Secondary | ICD-10-CM | POA: Diagnosis not present

## 2021-01-11 DIAGNOSIS — J181 Lobar pneumonia, unspecified organism: Secondary | ICD-10-CM | POA: Diagnosis not present

## 2021-01-11 DIAGNOSIS — R739 Hyperglycemia, unspecified: Secondary | ICD-10-CM | POA: Insufficient documentation

## 2021-01-11 DIAGNOSIS — R2243 Localized swelling, mass and lump, lower limb, bilateral: Secondary | ICD-10-CM | POA: Insufficient documentation

## 2021-01-11 DIAGNOSIS — R0601 Orthopnea: Secondary | ICD-10-CM | POA: Diagnosis not present

## 2021-01-11 DIAGNOSIS — J189 Pneumonia, unspecified organism: Secondary | ICD-10-CM

## 2021-01-11 DIAGNOSIS — E876 Hypokalemia: Secondary | ICD-10-CM | POA: Diagnosis not present

## 2021-01-11 DIAGNOSIS — Z20822 Contact with and (suspected) exposure to covid-19: Secondary | ICD-10-CM | POA: Insufficient documentation

## 2021-01-11 DIAGNOSIS — Z79899 Other long term (current) drug therapy: Secondary | ICD-10-CM | POA: Insufficient documentation

## 2021-01-11 DIAGNOSIS — R0602 Shortness of breath: Secondary | ICD-10-CM | POA: Diagnosis present

## 2021-01-11 DIAGNOSIS — R609 Edema, unspecified: Secondary | ICD-10-CM

## 2021-01-11 LAB — CBC WITH DIFFERENTIAL/PLATELET
Abs Immature Granulocytes: 0.02 10*3/uL (ref 0.00–0.07)
Basophils Absolute: 0 10*3/uL (ref 0.0–0.1)
Basophils Relative: 1 %
Eosinophils Absolute: 0.2 10*3/uL (ref 0.0–0.5)
Eosinophils Relative: 3 %
HCT: 35.5 % — ABNORMAL LOW (ref 36.0–46.0)
Hemoglobin: 11.4 g/dL — ABNORMAL LOW (ref 12.0–15.0)
Immature Granulocytes: 0 %
Lymphocytes Relative: 25 %
Lymphs Abs: 1.8 10*3/uL (ref 0.7–4.0)
MCH: 31.9 pg (ref 26.0–34.0)
MCHC: 32.1 g/dL (ref 30.0–36.0)
MCV: 99.4 fL (ref 80.0–100.0)
Monocytes Absolute: 0.5 10*3/uL (ref 0.1–1.0)
Monocytes Relative: 8 %
Neutro Abs: 4.6 10*3/uL (ref 1.7–7.7)
Neutrophils Relative %: 63 %
Platelets: 171 10*3/uL (ref 150–400)
RBC: 3.57 MIL/uL — ABNORMAL LOW (ref 3.87–5.11)
RDW: 15 % (ref 11.5–15.5)
WBC: 7.1 10*3/uL (ref 4.0–10.5)
nRBC: 0 % (ref 0.0–0.2)

## 2021-01-11 LAB — RESP PANEL BY RT-PCR (FLU A&B, COVID) ARPGX2
Influenza A by PCR: NEGATIVE
Influenza B by PCR: NEGATIVE
SARS Coronavirus 2 by RT PCR: NEGATIVE

## 2021-01-11 LAB — COMPREHENSIVE METABOLIC PANEL
ALT: 17 U/L (ref 0–44)
AST: 19 U/L (ref 15–41)
Albumin: 3.1 g/dL — ABNORMAL LOW (ref 3.5–5.0)
Alkaline Phosphatase: 75 U/L (ref 38–126)
Anion gap: 10 (ref 5–15)
BUN: 21 mg/dL (ref 8–23)
CO2: 25 mmol/L (ref 22–32)
Calcium: 8.8 mg/dL — ABNORMAL LOW (ref 8.9–10.3)
Chloride: 104 mmol/L (ref 98–111)
Creatinine, Ser: 0.79 mg/dL (ref 0.44–1.00)
GFR, Estimated: >60 mL/min (ref >60)
Glucose, Bld: 135 mg/dL — ABNORMAL HIGH (ref 70–99)
Potassium: 3.2 mmol/L — ABNORMAL LOW (ref 3.5–5.1)
Sodium: 139 mmol/L (ref 135–145)
Total Bilirubin: 0.8 mg/dL (ref 0.3–1.2)
Total Protein: 6.7 g/dL (ref 6.5–8.1)

## 2021-01-11 LAB — BRAIN NATRIURETIC PEPTIDE: B Natriuretic Peptide: 186.1 pg/mL — ABNORMAL HIGH (ref 0.0–100.0)

## 2021-01-11 MED ORDER — FUROSEMIDE 20 MG PO TABS: 20.0000 mg | ORAL_TABLET | Freq: Every day | ORAL | 0 refills | Status: DC

## 2021-01-11 MED ORDER — AZITHROMYCIN 250 MG PO TABS: 250.0000 mg | ORAL_TABLET | Freq: Every day | ORAL | 0 refills | Status: DC

## 2021-01-11 MED ORDER — FUROSEMIDE 10 MG/ML IJ SOLN
40.0000 mg | Freq: Once | INTRAMUSCULAR | Status: AC
  Administered 2021-01-11: 40 mg via INTRAVENOUS
  Filled 2021-01-11: qty 4

## 2021-01-11 MED ORDER — POTASSIUM CHLORIDE CRYS ER 20 MEQ PO TBCR
40.0000 meq | EXTENDED_RELEASE_TABLET | Freq: Once | ORAL | Status: AC
Start: 2021-01-11 — End: 2021-01-11
  Administered 2021-01-11: 40 meq via ORAL
  Filled 2021-01-11: qty 2

## 2021-01-11 MED ORDER — AZITHROMYCIN 250 MG PO TABS
500.0000 mg | ORAL_TABLET | Freq: Once | ORAL | Status: AC
  Administered 2021-01-11: 500 mg via ORAL
  Filled 2021-01-11: qty 2

## 2021-01-11 NOTE — ED Triage Notes (Signed)
Leg swelling and weeping of RLE sarted sunday morning Small would on RLE  PCP appoint tomorrow Cardiologist last week with stress test scheduled

## 2021-01-11 NOTE — Discharge Instructions (Addendum)
As we discussed the things that we found today included peripheral edema, and elevated BNP which is consistent with early heart failure.  I do recommend you continue follow-up with your primary care doctor, and cardiologist as we discussed for further evaluation of these problems.  I prescribed you 2 medications today, 1 is an antibiotic which is for a possible early pneumonia that we see on your chest x-ray.  I recommend that you take this on a full stomach as prescribed.  Additionally I have prescribed you a fluid pill, AKA diuretic, please take this once a day for the next 5 days unless you are told to discontinue by your primary care doctor or cardiologist.  You have significant worsening of your shortness of breath, or leg swelling before you are able to see your primary care doctor or cardiologist I recommend you return to the emergency department for evaluation, please return especially if you begin to develop chest pain, or shortness of breath at rest.

## 2021-01-11 NOTE — ED Provider Notes (Signed)
[facility] EMERGENCY DEPARTMENT Provider Note   CSN: [id number] Arrival date & time: 01/11/21  1650     History  Chief Complaint  Patient presents with   Leg Swelling    [name] is a 66 y.o. female With a past medical history significant for hypertension, obesity, history of heart murmur who presents with 2 days of worsening leg swelling, and weeping fluid from the right lower extremity.  Patient reports that she has had hypertension, obesity but she has not had significant leg swelling in the past.  Patient does report that for several months she has had some shortness of breath especially when in a recumbent position, she reports that she typically sleeps in recliner.  Patient does not take a diuretic at this time.  Patient reports that she does have a follow-up scheduled with her PCP, and cardiologist soon to discuss an echocardiogram, and evaluation of potential heart conditions.  Patient denies chest pain at this time, denies nausea, vomiting, diarrhea, cough, congestion, fever, chills.  HPI     Home Medications Prior to Admission medications   Medication Sig Start Date End Date Taking? Authorizing Provider  acetaminophen (TYLENOL) 325 MG tablet Take 650 mg by mouth every 6 (six) hours as needed. Patient used this medication for her headache.    [provider]  colesevelam (WELCHOL) 625 MG tablet Take 1,875 mg by mouth 2 (two) times daily with a meal.    [provider]  dicyclomine (BENTYL) 20 MG tablet Take 20 mg by mouth every 6 (six) hours.    [provider]  esomeprazole (NEXIUM) 40 MG capsule Take 40 mg by mouth daily before breakfast.    [provider]  gabapentin (NEURONTIN) 300 MG capsule Take 300 mg by mouth 3 (three) times daily.    [provider]  hydrOXYzine (ATARAX/VISTARIL) 25 MG tablet Take 25 mg by mouth 3 (three) times daily as needed.    [provider]  levothyroxine (SYNTHROID,  LEVOTHROID) 50 MCG tablet Take 125 mcg by mouth daily.     [provider]  magnesium oxide (MAG-OX) 400 MG tablet Take 400 mg by mouth daily.    [provider]  metoprolol succinate (TOPROL-XL) 25 MG 24 hr tablet Take 25 mg by mouth daily.    [provider]  montelukast (SINGULAIR) 10 MG tablet Take 10 mg by mouth at bedtime.    [provider]  oxyCODONE-acetaminophen (PERCOCET/ROXICET) 5-325 MG per tablet Take 1 tablet by mouth every 4 (four) hours as needed for pain. 02/27/12   [name], MD  potassium chloride (K-DUR,KLOR-CON) 10 MEQ tablet Take 10 mEq by mouth 2 (two) times daily.    [provider]  potassium citrate (UROCIT-K) 10 MEQ (1080 MG) SR tablet Take 120 mEq by mouth 4 (four) times daily.    [provider]  promethazine (PHENERGAN) 25 MG tablet Take 25 mg by mouth every 6 (six) hours as needed.    [provider]  rifaximin (XIFAXAN) 200 MG tablet Take 200 mg by mouth 3 (three) times daily.    [provider]  tizanidine (ZANAFLEX) 2 MG capsule Take 2 mg by mouth 3 (three) times daily.    [provider]  venlafaxine XR (EFFEXOR-XR) 150 MG 24 hr capsule Take 150 mg by mouth daily.    [provider]  Vitamin D, Ergocalciferol, (DRISDOL) 50000 UNITS CAPS Take 50,000 Units by mouth every 7 (seven) days.    [provider]  Allergies    Ivp dye [iodinated contrast media], Ace inhibitors, Doxycycline, Penicillins, and Zofran [ondansetron]    Review of Systems   Review of Systems  Respiratory:  Positive for shortness of breath.   Cardiovascular:  Positive for leg swelling.  All other systems reviewed and are negative.  Physical Exam Updated Vital Signs BP (!) 148/90 (BP Location: Right Arm)    Pulse 72    Temp 98.4 F (36.9 C) (Oral)    Resp 20    Ht 5\' 3"  (1.6 m)    Wt 136.1 kg    SpO2 98%    BMI 53.14 kg/m  Physical Exam Vitals and nursing note reviewed.   Constitutional:      General: She is not in acute distress.    Appearance: Normal appearance. She is obese.  HENT:     Head: Normocephalic and atraumatic.  Eyes:     General:        Right [name]: No discharge.        Left [name]: No discharge.  Cardiovascular:     Rate and Rhythm: Normal rate and regular rhythm.     Heart sounds: Murmur heard.    No friction rub. No gallop.     Comments: DP, PT pulses 2+ bilaterally. Pulmonary:     Effort: Pulmonary effort is normal.     Breath sounds: Normal breath sounds.     Comments: Patient has some isolated wheezing in the right lung, without focal consolidation noted on my exam Abdominal:     General: Bowel sounds are normal.     Palpations: Abdomen is soft.  Musculoskeletal:     Comments: Patient has 3+ pitting edema bilaterally with mall neck on the right leg that is actively draining clear serous fluid.  There is no asymmetry bilateral lower extremities, redness, or significant tenderness to palpation.  Skin:    General: Skin is warm and dry.     Capillary Refill: Capillary refill takes less than 2 seconds.  Neurological:     Mental Status: She is alert and oriented to person, place, and time.  Psychiatric:        Mood and Affect: Mood normal.        Behavior: Behavior normal.    ED Results / Procedures / Treatments   Labs (all labs ordered are listed, but only abnormal results are displayed) Labs Reviewed  CBC WITH DIFFERENTIAL/PLATELET - Abnormal; Notable for the following components:      Result Value   RBC 3.57 (*)    Hemoglobin 11.4 (*)    HCT 35.5 (*)    All other components within normal limits  COMPREHENSIVE METABOLIC PANEL - Abnormal; Notable for the following components:   Potassium 3.2 (*)    Glucose, Bld 135 (*)    Calcium 8.8 (*)    Albumin 3.1 (*)    All other components within normal limits  BRAIN NATRIURETIC PEPTIDE - Abnormal; Notable for the following components:   B Natriuretic Peptide 186.1 (*)    All other  components within normal limits  RESP PANEL BY RT-PCR (FLU A&B, COVID) [id number]    EKG EKG Interpretation  Date/Time:  Monday January 11 2021 18:58:39 EST Ventricular Rate:  70 PR Interval:  206 QRS Duration: 107 QT Interval:  411 QTC Calculation: 444 R Axis:   56 Text Interpretation: Sinus rhythm Low voltage, precordial leads Confirmed by Nanda Quinton [phone]) on 01/11/2021 7:13:14 PM  Radiology DG Chest 2 View  Result Date: 01/11/2021 CLINICAL  DATA:  Shortness of breath. EXAM: CHEST - 2 VIEW COMPARISON:  Chest x-ray 09/10/2020 FINDINGS: There is some patchy airspace opacity in the medial right upper lobe. The lungs otherwise appear clear. Heart is borderline enlarged. There is no pleural effusion or pneumothorax. No acute fractures are seen. IMPRESSION: 1. Medial right upper lobe airspace disease may represent infection. Other etiologies are not excluded. Followup PA and lateral chest X-ray is recommended in 3-4 weeks following trial of antibiotic therapy to ensure resolution and exclude underlying malignancy. Electronically Signed   By: Ronney Asters M.D.   On: 01/11/2021 19:23    Procedures Procedures    Medications Ordered in ED Medications  furosemide (LASIX) injection 40 mg (40 mg Intravenous Given 01/11/21 1952)  potassium chloride SA (KLOR-CON M) CR tablet 40 mEq (40 mEq Oral Given 01/11/21 1951)    ED Course/ Medical Decision Making/ A&P                           Medical Decision Making  This patient presents to the ED for concern of bilateral leg swelling, shortness of breath, orthopnea, this involves an extensive number of treatment options, and is a complaint that carries with it a high risk of complications and morbidity.  The differential diagnosis includes pneumonia, heart failure, ACS, DVT, PE, ischemic injury of the legs, compartment syndrome.   Co morbidities that complicate the patient evaluation  Obesity, hypertension, heart murmur   Additional history  obtained:  Additional history obtained from son  Lab Tests:  I Ordered, and personally interpreted labs.  The pertinent results include: Moderately elevated BNP of 186.1 CMP significant for hypokalemia of 3.2, mild hyperglycemia of 135, mild hypocalcemia of 8.8.  CBC significant for mild anemia of 11.4.  Patient is not having symptomatic changes of her hypokalemia, or anemia at this time.   Imaging Studies ordered:  I ordered imaging studies including chest x-ray 2 view I independently visualized and interpreted imaging which showed focal consolidation of the right upper lobe possibly consistent with pneumonia versus other airway disease. I agree with the radiologist interpretation, however based on patient's clinical appearance, other than a slight cough for the last few days as well as orthopnea she does not have any other signs or symptoms of pneumonia at this time, she is afebrile and otherwise well-appearing.  I ordered medication including potassium for hypokalemia, Lasix for fluid overload, early heart failure Reevaluation of the patient after these medicines showed that the patient stayed the same -results likely to show clinical significance in a greater time course then during patient's stay.  I have reviewed the patients home medicines and have made adjustments as needed  Problem List / ED Course:  Based on patient's presentation I have concern for poorly compensated early heart failure, with 3+ pitting edema bilaterally.  Patient does not have clinical signs and symptoms of limb ischemia, with present pulses, strength.  She additionally has no signs or symptoms of DVT.  Patient does have some orthopnea when entirely supine, however her chest x-ray does not show significant pleural effusion.  Patient also is able to walk around without oxygen desaturation, or feeling short of breath while in the emergency department today.   Reevaluation:  After the interventions noted above, I  reevaluated the patient and found that they have :improved   Social Determinants of Health:  Patient has good access to care, and close follow-up with her PCP, and cardiologist.   Dispostion:  Discussed with patient that she does show some concerning signs and symptoms of early heart failure, and fluid overload.  As patient is overall well-appearing, with stable vital signs, and normal oxygen saturation today I recommend that we start her on a short course of Lasix, as well as azithromycin for her pneumonia and have her follow-up closely with her primary care and cardiologist for further evaluation.  Patient understands and agrees to this plan, discharged in stable condition at this time.   Final Clinical Impression(s) / ED Diagnoses Final diagnoses:  None    Rx / DC Orders ED Discharge Orders     None         Dorien Chihuahua 01/11/21 2036    Margette Fast, MD 01/17/21 1737

## 2021-01-11 NOTE — ED Notes (Signed)
Patient discharged to home.  All discharge instructions reviewed.  Patient verbalized understanding via teachback method.  VS WDL.  Respirations even and unlabored.  Ambulatory out of ED.   °

## 2021-05-13 ENCOUNTER — Encounter (HOSPITAL_BASED_OUTPATIENT_CLINIC_OR_DEPARTMENT_OTHER): Payer: Self-pay

## 2021-05-13 ENCOUNTER — Other Ambulatory Visit: Payer: Self-pay

## 2021-05-13 ENCOUNTER — Emergency Department (HOSPITAL_BASED_OUTPATIENT_CLINIC_OR_DEPARTMENT_OTHER): Payer: Medicare (Managed Care)

## 2021-05-13 ENCOUNTER — Emergency Department (HOSPITAL_BASED_OUTPATIENT_CLINIC_OR_DEPARTMENT_OTHER)
Admission: EM | Admit: 2021-05-13 | Discharge: 2021-05-14 | Disposition: A | Discharge status: Home / Self Care | Payer: Medicare (Managed Care) | Attending: Emergency Medicine | Admitting: Emergency Medicine

## 2021-05-13 DIAGNOSIS — M7989 Other specified soft tissue disorders: Secondary | ICD-10-CM | POA: Diagnosis present

## 2021-05-13 DIAGNOSIS — I10 Essential (primary) hypertension: Secondary | ICD-10-CM | POA: Insufficient documentation

## 2021-05-13 DIAGNOSIS — R600 Localized edema: Secondary | ICD-10-CM

## 2021-05-13 DIAGNOSIS — Z87891 Personal history of nicotine dependence: Secondary | ICD-10-CM | POA: Insufficient documentation

## 2021-05-13 LAB — CBC WITH DIFFERENTIAL/PLATELET
Abs Immature Granulocytes: 0.03 10*3/uL (ref 0.00–0.07)
Basophils Absolute: 0.1 10*3/uL (ref 0.0–0.1)
Basophils Relative: 1 %
Eosinophils Absolute: 0.2 10*3/uL (ref 0.0–0.5)
Eosinophils Relative: 3 %
HCT: 34.2 % — ABNORMAL LOW (ref 36.0–46.0)
Hemoglobin: 10.9 g/dL — ABNORMAL LOW (ref 12.0–15.0)
Immature Granulocytes: 0 %
Lymphocytes Relative: 15 %
Lymphs Abs: 1.3 10*3/uL (ref 0.7–4.0)
MCH: 29.4 pg (ref 26.0–34.0)
MCHC: 31.9 g/dL (ref 30.0–36.0)
MCV: 92.2 fL (ref 80.0–100.0)
Monocytes Absolute: 0.6 10*3/uL (ref 0.1–1.0)
Monocytes Relative: 7 %
Neutro Abs: 6.4 10*3/uL (ref 1.7–7.7)
Neutrophils Relative %: 74 %
Platelets: 121 10*3/uL — ABNORMAL LOW (ref 150–400)
RBC: 3.71 MIL/uL — ABNORMAL LOW (ref 3.87–5.11)
RDW: 17.7 % — ABNORMAL HIGH (ref 11.5–15.5)
WBC: 8.6 10*3/uL (ref 4.0–10.5)
nRBC: 0 % (ref 0.0–0.2)

## 2021-05-13 LAB — BASIC METABOLIC PANEL
Anion gap: 7 (ref 5–15)
BUN: 20 mg/dL (ref 8–23)
CO2: 25 mmol/L (ref 22–32)
Calcium: 8.2 mg/dL — ABNORMAL LOW (ref 8.9–10.3)
Chloride: 104 mmol/L (ref 98–111)
Creatinine, Ser: 0.94 mg/dL (ref 0.44–1.00)
GFR, Estimated: >60 mL/min (ref >60)
Glucose, Bld: 139 mg/dL — ABNORMAL HIGH (ref 70–99)
Potassium: 3.2 mmol/L — ABNORMAL LOW (ref 3.5–5.1)
Sodium: 136 mmol/L (ref 135–145)

## 2021-05-13 LAB — BRAIN NATRIURETIC PEPTIDE: B Natriuretic Peptide: 111.5 pg/mL — ABNORMAL HIGH (ref 0.0–100.0)

## 2021-05-13 NOTE — ED Provider Notes (Signed)
?[place] [facility] ?[facility] Emergency Department ?Provider Note ?MRN:  [id number]  ?Arrival date & time: 05/14/21    ? ?Chief Complaint   ?Leg Swelling ?  ?History of Present Illness   ?[name] is a 66 y.o. year-old female with a history of PE, hypertension presenting to the ED with chief complaint of leg swelling. ? ?Painful swelling worsening over the past 4 to 5 days.  Swelling in bilateral legs with some weeping.  Happened several months ago, was started on Lasix.  Recently had a heart echocardiogram that was reassuring.  Denies chest pain, has some mild dyspnea on exertion which is not new.  No abdominal pain, no fever, no cough. ? ?Review of Systems  ?A thorough review of systems was obtained and all systems are negative except as noted in the HPI and PMH.  ? ?Patient's Health History   ? ?Past Medical History:  ?Diagnosis Date  ? Chronic diarrhea   ? Heart murmur   ? Hypertension   ? Kidney stone   ? Obesity   ? Pulmonary embolism ([place])   ? Short gut syndrome   ? Thyroid disease   ?  ?Past Surgical History:  ?Procedure Laterality Date  ? CHOLECYSTECTOMY    ? COLON SURGERY    ? HERNIA REPAIR    ? ILEOSTOMY    ? ILEOSTOMY CLOSURE    ? KIDNEY STONE SURGERY    ? WRIST SURGERY    ?  ?History reviewed. No pertinent family history.  ?Social History  ? ?Socioeconomic History  ? Marital status: Single  ?  Spouse name: Not on file  ? Number of children: Not on file  ? Years of education: Not on file  ? Highest education level: Not on file  ?Occupational History  ? Not on file  ?Tobacco Use  ? Smoking status: Former  ? Smokeless tobacco: Never  ?Substance and Sexual Activity  ? Alcohol use: No  ? Drug use: No  ? Sexual activity: Yes  ?  Birth control/protection: Post-menopausal  ?Other Topics Concern  ? Not on file  ?Social History Narrative  ? Not on file  ? ?Social Determinants of Health  ? ?Financial Resource Strain: Not on file  ?Food Insecurity: Not on file  ?Transportation Needs: Not on file   ?Physical Activity: Not on file  ?Stress: Not on file  ?Social Connections: Not on file  ?Intimate Partner Violence: Not on file  ?  ? ?Physical Exam  ? ?Vitals:  ? 05/13/21 2159 05/14/21 0025  ?BP: (!) 160/80 101/63  ?Pulse: 78 75  ?Resp: (!) 24 18  ?Temp: 98.7 ?F (37.1 ?C)   ?SpO2: 96% 99%  ?  ?CONSTITUTIONAL: Chronically ill-appearing, NAD ?NEURO/PSYCH:  Alert and oriented x 3, no focal deficits ?EYES:  eyes equal and reactive ?ENT/NECK:  no LAD, no JVD ?CARDIO: Regular rate, well-perfused, normal S1 and S2 ?PULM:  CTAB no wheezing or rhonchi ?GI/GU:  non-distended, non-tender ?MSK/SPINE:  No gross deformities, pitting edema to bilateral lower extremities, tender to palpation ?SKIN:  no rash, atraumatic ? ? ?*Additional and/or pertinent findings included in MDM below ? ?Diagnostic and Interventional Summary  ? ? EKG Interpretation ? ?Date/Time:  Thursday May 13 2021 23:21:30 EDT ?Ventricular Rate:  74 ?PR Interval:  203 ?QRS Duration: 114 ?QT Interval:  391 ?QTC Calculation: 434 ?R Axis:   16 ?Text Interpretation: Sinus rhythm Anterior infarct, old Confirmed by Gerlene Fee [id number]) on 05/13/2021 11:35:54 PM ?  ? ?  ? ?Labs  Reviewed  ?CBC WITH DIFFERENTIAL/PLATELET - Abnormal; Notable for the following components:  ?    Result Value  ? RBC 3.71 (*)   ? Hemoglobin 10.9 (*)   ? HCT 34.2 (*)   ? RDW 17.7 (*)   ? Platelets 121 (*)   ? All other components within normal limits  ?BASIC METABOLIC PANEL - Abnormal; Notable for the following components:  ? Potassium 3.2 (*)   ? Glucose, Bld 139 (*)   ? Calcium 8.2 (*)   ? All other components within normal limits  ?BRAIN NATRIURETIC PEPTIDE - Abnormal; Notable for the following components:  ? B Natriuretic Peptide 111.5 (*)   ? All other components within normal limits  ?  ?US Venous Img Lower Bilateral  ?Final Result  ?  ?DG Chest Port 1 View  ?Final Result  ?  ?  ?Medications - No data to display  ? ?Procedures  /  Critical Care ?Procedures ? ?ED Course and Medical  Decision Making  ?Initial Impression and Ddx ?Favoring venous insufficiency of the lower extremities causing swelling especially given recent normal echocardiogram.  DVT is also considered as patient has a history of PE, not on anticoagulation.  Ultrasound pending. ? ?Past medical/surgical history that increases complexity of ED encounter: PE ? ?Interpretation of Diagnostics ?I personally reviewed the Chest Xray and my interpretation is as follows: No pneumothorax, no significant edema ?   ? ? ?Patient Reassessment and Ultimate Disposition/Management ?Work-up is overall reassuring, DVT ultrasound is negative, no significant blood count or electrolyte disturbance, appropriate for discharge. ? ?Patient management required discussion with the following services or consulting groups:  None ? ?Complexity of Problems Addressed ?Acute illness or injury that poses threat of life of bodily function ? ?Additional Data Reviewed and Analyzed ?Further history obtained from: ?Further history from spouse/family member ? ?Additional Factors Impacting ED Encounter Risk ?Minor Procedures ? ?Barth Kirks. Sedonia Small, MD ?[facility] Emergency Medicine ?[place] [email or web address] ? ?Final Clinical Impressions(s) / ED Diagnoses  ? ?  ICD-10-CM   ?1. Leg edema  R60.0   ?  ?  ?ED Discharge Orders   ? ? None  ? ?  ?  ? ?Discharge Instructions Discussed with and Provided to Patient:  ? ? ? ?Discharge Instructions   ? ?  ?You were evaluated in the Emergency Department and after careful evaluation, we did not find any emergent condition requiring admission or further testing in the hospital. ? ?Your exam/testing today was overall reassuring.  No signs of blood clots on the ultrasound.  Recommend increasing your Lasix to 40 mg daily for the next few days and following up closely with your regular doctor.  Recommend compression stockings and leg elevation as we discussed. ? ?Please return to the Emergency Department if you  experience any worsening of your condition.  Thank you for allowing [place] to be a part of your care. ? ? ? ? ? ?  ?Maudie Flakes, MD ?05/14/21 [phone] ? ?

## 2021-05-13 NOTE — ED Triage Notes (Signed)
Pt arrives POV c/o bilateral leg swelling that started 4 days ago; Pt's legs are lightly seeping. Pt has hx HTN and reports taking lasix. Pt a&o x4. ?

## 2021-05-14 NOTE — Discharge Instructions (Signed)
You were evaluated in the Emergency Department and after careful evaluation, we did not find any emergent condition requiring admission or further testing in the hospital. ? ?Your exam/testing today was overall reassuring.  No signs of blood clots on the ultrasound.  Recommend increasing your Lasix to 40 mg daily for the next few days and following up closely with your regular doctor.  Recommend compression stockings and leg elevation as we discussed. ? ?Please return to the Emergency Department if you experience any worsening of your condition.  Thank you for allowing [place] to be a part of your care. ? ?

## 2021-06-17 DIAGNOSIS — E7220 Disorder of urea cycle metabolism, unspecified: Secondary | ICD-10-CM | POA: Insufficient documentation

## 2021-06-22 DIAGNOSIS — Z79899 Other long term (current) drug therapy: Secondary | ICD-10-CM | POA: Insufficient documentation

## 2021-10-03 DIAGNOSIS — K7460 Unspecified cirrhosis of liver: Secondary | ICD-10-CM

## 2021-10-18 DIAGNOSIS — K7469 Other cirrhosis of liver: Secondary | ICD-10-CM | POA: Diagnosis not present

## 2021-10-18 DIAGNOSIS — K121 Other forms of stomatitis: Secondary | ICD-10-CM | POA: Diagnosis not present

## 2021-10-18 DIAGNOSIS — E7220 Disorder of urea cycle metabolism, unspecified: Secondary | ICD-10-CM | POA: Diagnosis not present

## 2021-10-18 DIAGNOSIS — Z23 Encounter for immunization: Secondary | ICD-10-CM | POA: Diagnosis not present

## 2021-10-18 DIAGNOSIS — D508 Other iron deficiency anemias: Secondary | ICD-10-CM | POA: Diagnosis not present

## 2021-10-18 DIAGNOSIS — I10 Essential (primary) hypertension: Secondary | ICD-10-CM | POA: Diagnosis not present

## 2021-10-18 DIAGNOSIS — E039 Hypothyroidism, unspecified: Secondary | ICD-10-CM | POA: Diagnosis not present

## 2021-11-11 DIAGNOSIS — R531 Weakness: Secondary | ICD-10-CM | POA: Diagnosis not present

## 2021-11-11 DIAGNOSIS — R0902 Hypoxemia: Secondary | ICD-10-CM | POA: Diagnosis not present

## 2021-11-11 DIAGNOSIS — I959 Hypotension, unspecified: Secondary | ICD-10-CM | POA: Diagnosis not present

## 2021-11-11 DIAGNOSIS — R000 Tachycardia, unspecified: Secondary | ICD-10-CM | POA: Diagnosis not present

## 2021-11-11 DIAGNOSIS — Z743 Need for continuous supervision: Secondary | ICD-10-CM | POA: Diagnosis not present

## 2021-11-24 DIAGNOSIS — I959 Hypotension, unspecified: Secondary | ICD-10-CM | POA: Diagnosis present

## 2021-11-24 DIAGNOSIS — Z780 Asymptomatic menopausal state: Secondary | ICD-10-CM | POA: Insufficient documentation

## 2021-12-01 DIAGNOSIS — Z515 Encounter for palliative care: Secondary | ICD-10-CM | POA: Insufficient documentation

## 2021-12-10 DIAGNOSIS — R5381 Other malaise: Secondary | ICD-10-CM | POA: Insufficient documentation

## 2021-12-15 DIAGNOSIS — N61 Mastitis without abscess: Secondary | ICD-10-CM | POA: Insufficient documentation

## 2022-01-14 DIAGNOSIS — K219 Gastro-esophageal reflux disease without esophagitis: Secondary | ICD-10-CM | POA: Diagnosis not present

## 2022-01-14 DIAGNOSIS — N61 Mastitis without abscess: Secondary | ICD-10-CM | POA: Diagnosis not present

## 2022-01-14 DIAGNOSIS — M109 Gout, unspecified: Secondary | ICD-10-CM | POA: Diagnosis not present

## 2022-01-14 DIAGNOSIS — K760 Fatty (change of) liver, not elsewhere classified: Secondary | ICD-10-CM | POA: Diagnosis not present

## 2022-01-14 DIAGNOSIS — R1310 Dysphagia, unspecified: Secondary | ICD-10-CM | POA: Diagnosis not present

## 2022-01-14 DIAGNOSIS — F331 Major depressive disorder, recurrent, moderate: Secondary | ICD-10-CM | POA: Diagnosis not present

## 2022-01-14 DIAGNOSIS — G4733 Obstructive sleep apnea (adult) (pediatric): Secondary | ICD-10-CM | POA: Diagnosis not present

## 2022-01-14 DIAGNOSIS — K766 Portal hypertension: Secondary | ICD-10-CM | POA: Diagnosis not present

## 2022-01-14 DIAGNOSIS — K5790 Diverticulosis of intestine, part unspecified, without perforation or abscess without bleeding: Secondary | ICD-10-CM | POA: Diagnosis not present

## 2022-01-14 DIAGNOSIS — M1712 Unilateral primary osteoarthritis, left knee: Secondary | ICD-10-CM | POA: Diagnosis not present

## 2022-01-14 DIAGNOSIS — R188 Other ascites: Secondary | ICD-10-CM | POA: Diagnosis not present

## 2022-01-14 DIAGNOSIS — K7581 Nonalcoholic steatohepatitis (NASH): Secondary | ICD-10-CM | POA: Diagnosis not present

## 2022-01-14 DIAGNOSIS — I10 Essential (primary) hypertension: Secondary | ICD-10-CM | POA: Diagnosis not present

## 2022-01-29 DIAGNOSIS — J90 Pleural effusion, not elsewhere classified: Secondary | ICD-10-CM | POA: Insufficient documentation

## 2022-02-08 DIAGNOSIS — Z961 Presence of intraocular lens: Secondary | ICD-10-CM | POA: Insufficient documentation

## 2022-03-07 DIAGNOSIS — K766 Portal hypertension: Secondary | ICD-10-CM | POA: Diagnosis not present

## 2022-03-07 DIAGNOSIS — L03115 Cellulitis of right lower limb: Secondary | ICD-10-CM | POA: Insufficient documentation

## 2022-03-07 DIAGNOSIS — K7581 Nonalcoholic steatohepatitis (NASH): Secondary | ICD-10-CM | POA: Diagnosis not present

## 2022-03-07 DIAGNOSIS — I10 Essential (primary) hypertension: Secondary | ICD-10-CM | POA: Diagnosis not present

## 2022-03-07 DIAGNOSIS — E538 Deficiency of other specified B group vitamins: Secondary | ICD-10-CM | POA: Diagnosis not present

## 2022-03-07 DIAGNOSIS — N61 Mastitis without abscess: Secondary | ICD-10-CM | POA: Diagnosis not present

## 2022-03-07 DIAGNOSIS — F331 Major depressive disorder, recurrent, moderate: Secondary | ICD-10-CM | POA: Diagnosis not present

## 2022-03-07 DIAGNOSIS — K219 Gastro-esophageal reflux disease without esophagitis: Secondary | ICD-10-CM | POA: Diagnosis not present

## 2022-03-07 DIAGNOSIS — E7220 Disorder of urea cycle metabolism, unspecified: Secondary | ICD-10-CM | POA: Diagnosis not present

## 2022-03-07 DIAGNOSIS — K760 Fatty (change of) liver, not elsewhere classified: Secondary | ICD-10-CM | POA: Diagnosis not present

## 2022-03-07 DIAGNOSIS — M109 Gout, unspecified: Secondary | ICD-10-CM | POA: Diagnosis not present

## 2022-03-07 DIAGNOSIS — E039 Hypothyroidism, unspecified: Secondary | ICD-10-CM | POA: Diagnosis not present

## 2022-03-07 DIAGNOSIS — E785 Hyperlipidemia, unspecified: Secondary | ICD-10-CM | POA: Diagnosis not present

## 2022-03-07 DIAGNOSIS — K439 Ventral hernia without obstruction or gangrene: Secondary | ICD-10-CM | POA: Diagnosis not present

## 2022-04-18 DIAGNOSIS — I10 Essential (primary) hypertension: Secondary | ICD-10-CM | POA: Insufficient documentation

## 2022-04-18 DIAGNOSIS — H16072 Perforated corneal ulcer, left eye: Secondary | ICD-10-CM

## 2022-04-18 HISTORY — DX: Perforated corneal ulcer, left eye: H16.072

## 2022-04-29 DIAGNOSIS — H189 Unspecified disorder of cornea: Secondary | ICD-10-CM | POA: Insufficient documentation

## 2022-04-29 DIAGNOSIS — Z9889 Other specified postprocedural states: Secondary | ICD-10-CM | POA: Insufficient documentation

## 2022-04-30 ENCOUNTER — Emergency Department (HOSPITAL_COMMUNITY): Payer: Medicare (Managed Care)

## 2022-04-30 ENCOUNTER — Inpatient Hospital Stay (HOSPITAL_COMMUNITY)
Admission: EM | Admit: 2022-04-30 | Discharge: 2022-05-10 | DRG: 871 | Disposition: A | Discharge status: Home Health | Payer: Medicare (Managed Care) | Attending: Internal Medicine | Admitting: Internal Medicine

## 2022-04-30 DIAGNOSIS — Z86718 Personal history of other venous thrombosis and embolism: Secondary | ICD-10-CM

## 2022-04-30 DIAGNOSIS — K90829 Short bowel syndrome, unspecified: Secondary | ICD-10-CM | POA: Diagnosis present

## 2022-04-30 DIAGNOSIS — N61 Mastitis without abscess: Secondary | ICD-10-CM | POA: Diagnosis present

## 2022-04-30 DIAGNOSIS — E876 Hypokalemia: Secondary | ICD-10-CM | POA: Diagnosis not present

## 2022-04-30 DIAGNOSIS — A4159 Other Gram-negative sepsis: Principal | ICD-10-CM | POA: Diagnosis present

## 2022-04-30 DIAGNOSIS — H5462 Unqualified visual loss, left eye, normal vision right eye: Secondary | ICD-10-CM | POA: Diagnosis present

## 2022-04-30 DIAGNOSIS — E785 Hyperlipidemia, unspecified: Secondary | ICD-10-CM | POA: Diagnosis present

## 2022-04-30 DIAGNOSIS — L03313 Cellulitis of chest wall: Secondary | ICD-10-CM | POA: Diagnosis not present

## 2022-04-30 DIAGNOSIS — Z79890 Hormone replacement therapy (postmenopausal): Secondary | ICD-10-CM | POA: Diagnosis not present

## 2022-04-30 DIAGNOSIS — R188 Other ascites: Secondary | ICD-10-CM | POA: Diagnosis present

## 2022-04-30 DIAGNOSIS — F32A Depression, unspecified: Secondary | ICD-10-CM | POA: Diagnosis present

## 2022-04-30 DIAGNOSIS — E8809 Other disorders of plasma-protein metabolism, not elsewhere classified: Secondary | ICD-10-CM | POA: Diagnosis not present

## 2022-04-30 DIAGNOSIS — E872 Acidosis, unspecified: Secondary | ICD-10-CM | POA: Diagnosis present

## 2022-04-30 DIAGNOSIS — B372 Candidiasis of skin and nail: Secondary | ICD-10-CM | POA: Diagnosis not present

## 2022-04-30 DIAGNOSIS — Z1611 Resistance to penicillins: Secondary | ICD-10-CM | POA: Diagnosis present

## 2022-04-30 DIAGNOSIS — N390 Urinary tract infection, site not specified: Secondary | ICD-10-CM | POA: Diagnosis present

## 2022-04-30 DIAGNOSIS — I480 Paroxysmal atrial fibrillation: Secondary | ICD-10-CM | POA: Diagnosis present

## 2022-04-30 DIAGNOSIS — Z9889 Other specified postprocedural states: Secondary | ICD-10-CM

## 2022-04-30 DIAGNOSIS — K7682 Hepatic encephalopathy: Secondary | ICD-10-CM | POA: Diagnosis present

## 2022-04-30 DIAGNOSIS — N179 Acute kidney failure, unspecified: Secondary | ICD-10-CM | POA: Diagnosis not present

## 2022-04-30 DIAGNOSIS — Z6841 Body Mass Index (BMI) 40.0 and over, adult: Secondary | ICD-10-CM

## 2022-04-30 DIAGNOSIS — R6520 Severe sepsis without septic shock: Secondary | ICD-10-CM | POA: Diagnosis not present

## 2022-04-30 DIAGNOSIS — K7460 Unspecified cirrhosis of liver: Secondary | ICD-10-CM

## 2022-04-30 DIAGNOSIS — D509 Iron deficiency anemia, unspecified: Secondary | ICD-10-CM | POA: Diagnosis present

## 2022-04-30 DIAGNOSIS — Z880 Allergy status to penicillin: Secondary | ICD-10-CM

## 2022-04-30 DIAGNOSIS — E039 Hypothyroidism, unspecified: Secondary | ICD-10-CM | POA: Diagnosis present

## 2022-04-30 DIAGNOSIS — I10 Essential (primary) hypertension: Secondary | ICD-10-CM | POA: Diagnosis present

## 2022-04-30 DIAGNOSIS — Z881 Allergy status to other antibiotic agents status: Secondary | ICD-10-CM

## 2022-04-30 DIAGNOSIS — D6959 Other secondary thrombocytopenia: Secondary | ICD-10-CM | POA: Diagnosis present

## 2022-04-30 DIAGNOSIS — B961 Klebsiella pneumoniae [K. pneumoniae] as the cause of diseases classified elsewhere: Secondary | ICD-10-CM | POA: Diagnosis present

## 2022-04-30 DIAGNOSIS — A419 Sepsis, unspecified organism: Secondary | ICD-10-CM | POA: Diagnosis present

## 2022-04-30 DIAGNOSIS — K7581 Nonalcoholic steatohepatitis (NASH): Secondary | ICD-10-CM | POA: Diagnosis present

## 2022-04-30 DIAGNOSIS — Z86711 Personal history of pulmonary embolism: Secondary | ICD-10-CM

## 2022-04-30 DIAGNOSIS — I9589 Other hypotension: Secondary | ICD-10-CM

## 2022-04-30 DIAGNOSIS — K7469 Other cirrhosis of liver: Secondary | ICD-10-CM | POA: Diagnosis not present

## 2022-04-30 DIAGNOSIS — Z79899 Other long term (current) drug therapy: Secondary | ICD-10-CM

## 2022-04-30 DIAGNOSIS — E66813 Obesity, class 3: Secondary | ICD-10-CM

## 2022-04-30 DIAGNOSIS — L03319 Cellulitis of trunk, unspecified: Secondary | ICD-10-CM

## 2022-04-30 DIAGNOSIS — I890 Lymphedema, not elsewhere classified: Secondary | ICD-10-CM | POA: Diagnosis not present

## 2022-04-30 DIAGNOSIS — Z888 Allergy status to other drugs, medicaments and biological substances status: Secondary | ICD-10-CM

## 2022-04-30 DIAGNOSIS — M109 Gout, unspecified: Secondary | ICD-10-CM | POA: Diagnosis present

## 2022-04-30 DIAGNOSIS — R6521 Severe sepsis with septic shock: Secondary | ICD-10-CM | POA: Diagnosis present

## 2022-04-30 DIAGNOSIS — Z91041 Radiographic dye allergy status: Secondary | ICD-10-CM

## 2022-04-30 DIAGNOSIS — Z87891 Personal history of nicotine dependence: Secondary | ICD-10-CM

## 2022-04-30 DIAGNOSIS — D649 Anemia, unspecified: Secondary | ICD-10-CM | POA: Diagnosis not present

## 2022-04-30 DIAGNOSIS — I959 Hypotension, unspecified: Secondary | ICD-10-CM

## 2022-04-30 DIAGNOSIS — R011 Cardiac murmur, unspecified: Secondary | ICD-10-CM | POA: Diagnosis present

## 2022-04-30 LAB — CBC WITH DIFFERENTIAL/PLATELET
Abs Immature Granulocytes: 0.31 10*3/uL — ABNORMAL HIGH (ref 0.00–0.07)
Basophils Absolute: 0.1 10*3/uL (ref 0.0–0.1)
Basophils Relative: 0 %
Eosinophils Absolute: 0.1 10*3/uL (ref 0.0–0.5)
Eosinophils Relative: 1 %
HCT: 28.4 % — ABNORMAL LOW (ref 36.0–46.0)
Hemoglobin: 8.9 g/dL — ABNORMAL LOW (ref 12.0–15.0)
Immature Granulocytes: 1 %
Lymphocytes Relative: 3 %
Lymphs Abs: 0.8 10*3/uL (ref 0.7–4.0)
MCH: 29.1 pg (ref 26.0–34.0)
MCHC: 31.3 g/dL (ref 30.0–36.0)
MCV: 92.8 fL (ref 80.0–100.0)
Monocytes Absolute: 0.8 10*3/uL (ref 0.1–1.0)
Monocytes Relative: 3 %
Neutro Abs: 25.9 10*3/uL — ABNORMAL HIGH (ref 1.7–7.7)
Neutrophils Relative %: 92 %
Platelets: 195 10*3/uL (ref 150–400)
RBC: 3.06 MIL/uL — ABNORMAL LOW (ref 3.87–5.11)
RDW: 21.2 % — ABNORMAL HIGH (ref 11.5–15.5)
WBC: 28 10*3/uL — ABNORMAL HIGH (ref 4.0–10.5)
nRBC: 0.1 % (ref 0.0–0.2)

## 2022-04-30 LAB — I-STAT CHEM 8, ED
BUN: 20 mg/dL (ref 8–23)
Calcium, Ion: 1.13 mmol/L — ABNORMAL LOW (ref 1.15–1.40)
Chloride: 109 mmol/L (ref 98–111)
Creatinine, Ser: 1.2 mg/dL — ABNORMAL HIGH (ref 0.44–1.00)
Glucose, Bld: 97 mg/dL (ref 70–99)
HCT: 30 % — ABNORMAL LOW (ref 36.0–46.0)
Hemoglobin: 10.2 g/dL — ABNORMAL LOW (ref 12.0–15.0)
Potassium: 3.7 mmol/L (ref 3.5–5.1)
Sodium: 137 mmol/L (ref 135–145)
TCO2: 14 mmol/L — ABNORMAL LOW (ref 22–32)

## 2022-04-30 LAB — APTT: aPTT: 44 seconds — ABNORMAL HIGH (ref 24–36)

## 2022-04-30 LAB — FIBRINOGEN: Fibrinogen: 179 mg/dL — ABNORMAL LOW (ref 210–475)

## 2022-04-30 LAB — PROTIME-INR
INR: 2.4 — ABNORMAL HIGH (ref 0.8–1.2)
Prothrombin Time: 26.3 seconds — ABNORMAL HIGH (ref 11.4–15.2)

## 2022-04-30 LAB — D-DIMER, QUANTITATIVE: D-Dimer, Quant: 3.29 ug/mL-FEU — ABNORMAL HIGH (ref 0.00–0.50)

## 2022-04-30 MED ORDER — VANCOMYCIN HCL 2000 MG/400ML IV SOLN
2000.0000 mg | Freq: Once | INTRAVENOUS | Status: AC
  Administered 2022-04-30: 2000 mg via INTRAVENOUS
  Filled 2022-04-30: qty 400

## 2022-04-30 MED ORDER — SODIUM CHLORIDE 0.9 % IV BOLUS (SEPSIS)
1000.0000 mL | Freq: Once | INTRAVENOUS | Status: AC
  Administered 2022-04-30: 1000 mL via INTRAVENOUS

## 2022-04-30 MED ORDER — NORTRIPTYLINE HCL 25 MG PO CAPS
25.0000 mg | ORAL_CAPSULE | Freq: Every evening | ORAL | Status: DC | PRN
  Filled 2022-04-30: qty 1

## 2022-04-30 MED ORDER — SODIUM CHLORIDE 0.9 % IV SOLN
2.0000 g | Freq: Once | INTRAVENOUS | Status: AC
  Administered 2022-04-30: 2 g via INTRAVENOUS
  Filled 2022-04-30: qty 12.5

## 2022-04-30 MED ORDER — RIFAXIMIN 550 MG PO TABS
550.0000 mg | ORAL_TABLET | Freq: Two times a day (BID) | ORAL | Status: DC
  Administered 2022-05-01 – 2022-05-10 (×20): 550 mg via ORAL
  Filled 2022-04-30 (×21): qty 1

## 2022-04-30 MED ORDER — LEVOTHYROXINE SODIUM 50 MCG PO TABS
150.0000 ug | ORAL_TABLET | Freq: Every day | ORAL | Status: DC
  Administered 2022-05-01 – 2022-05-10 (×10): 150 ug via ORAL
  Filled 2022-04-30 (×3): qty 1
  Filled 2022-04-30 (×2): qty 2
  Filled 2022-04-30: qty 1
  Filled 2022-04-30: qty 2
  Filled 2022-04-30 (×3): qty 1

## 2022-04-30 MED ORDER — OXYCODONE HCL 5 MG PO TABS
5.0000 mg | ORAL_TABLET | Freq: Once | ORAL | Status: AC
  Administered 2022-04-30: 5 mg via ORAL
  Filled 2022-04-30: qty 1

## 2022-04-30 MED ORDER — VENLAFAXINE HCL ER 75 MG PO CP24
75.0000 mg | ORAL_CAPSULE | Freq: Every day | ORAL | Status: DC
  Administered 2022-05-01 – 2022-05-10 (×10): 75 mg via ORAL
  Filled 2022-04-30 (×10): qty 1

## 2022-04-30 MED ORDER — NEOMYCIN-POLYMYXIN-DEXAMETH 3.5-10000-0.1 OP OINT
TOPICAL_OINTMENT | Freq: Four times a day (QID) | OPHTHALMIC | Status: DC
  Administered 2022-05-01 – 2022-05-07 (×8): 1 via OPHTHALMIC
  Filled 2022-04-30: qty 3.5

## 2022-04-30 MED ORDER — DIPHENHYDRAMINE HCL 50 MG/ML IJ SOLN: 12.5000 mg | Freq: Once | INTRAMUSCULAR | Status: DC

## 2022-04-30 NOTE — ED Triage Notes (Signed)
Pt BIB [facility] from home. Ems reports pt temp was 101. Pt had eye surgery Thursday and was discharged Friday. Pt meets ems sepsis criteria. Pt hx pulmonary edema, hx bowel perforation, hx sepsis. Rales heard in RLQ  145/100BP 36RR 113CBG ETCO2 18 110HR

## 2022-04-30 NOTE — ED Notes (Signed)
Assisted RN with cleaning pt with soap and water barrier cream applied, pt used bedpan able to roll

## 2022-04-30 NOTE — ED Notes (Signed)
Gave pt a soda RN aware

## 2022-04-30 NOTE — H&P (Incomplete)
History and Physical    Patient: [name] JYN:[id number] DOB: 07-09-1955 DOA: 04/30/2022 DOS: the patient was seen and examined on 05/01/2022 PCP: [name], MD  Patient coming from: Home  Chief Complaint:  Chief Complaint  Patient presents with   Code Sepsis   HPI: [name] is a 67 y.o. 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   Son at bedside provides most the hx.  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 Had been doing fine otherwise. However today had temperature of 101 F, seemed more confused. Had increase shortness of breath, cough. Had multiple episodes of vomiting. No diarrhea or abdominal pain. No sick contact. Has been dealing with yeast infection beneath left breast and all around her groin/vulvarvaginal region. Just started doing nystatin powder.  In the ED, afebrile, hypotensive down 90/50 with improvement following fluid bolus.Tachycardic HR 108. Initially on 4L via [place] but has been weaned down to room air with desaturation.   Leukocytosis of 28K, hgb of 8.9 down from baseline of 10-11. CMP is pending.   CXR is negative.  CT chest/abd/pelvis with trace bilateral pleural effusion and associated atelectases of left lower lobe with pneumonia not excluded. Diffuse subcutaneous edema/anasarca.   Thought to have diffuse cellulitis around left breast per EDP and was  started on vancomycin and cefepime.   Hospitalist then consulted for admission.    Review of Systems: As mentioned in the history of present illness. All other systems reviewed and are negative. Past Medical History:  Diagnosis Date   Chronic diarrhea    Heart murmur    Hypertension    Kidney stone    Obesity    Pulmonary embolism (H[facility])    Short gut syndrome    Thyroid disease    Past Surgical History:   Procedure Laterality Date   CHOLECYSTECTOMY     COLON SURGERY     HERNIA REPAIR     ILEOSTOMY     ILEOSTOMY CLOSURE     KIDNEY STONE SURGERY     WRIST SURGERY     Social History:  reports that she has quit smoking. She has never used smokeless tobacco. She reports that she does not drink alcohol and does not use drugs.  Allergies  Allergen Reactions   Ivp Dye [Iodinated Contrast Media] Anaphylaxis   Ace Inhibitors Swelling   Doxycycline Rash   Penicillins Rash    Did it involve swelling of the face/tongue/throat, SOB, or low BP? No Did it involve sudden or severe rash/hives, skin peeling, or any reaction on the inside of your mouth or nose? Yes Did you need to seek medical attention at a hospital or doctor's office? No When did it last happen? Several Years Ago      If all above answers are "NO", may proceed with cephalosporin use.     Zofran [Ondansetron] Rash    No family history on file.  Prior to Admission medications   Medication Sig Start Date End Date Taking? Authorizing Provider  acetaminophen (TYLENOL) 325 MG tablet Take 650 mg by mouth every 6 (six) hours as needed. Patient used this medication for her headache.    [provider]  azithromycin (ZITHROMAX) 250 MG tablet Take 1 tablet (250 mg total) by mouth daily. Take first 2 tablets together, then 1 every day until finished. 01/11/21   Prosperi, Ephriam Knuckles  H, PA-C  colesevelam (WELCHOL) 625 MG tablet Take 1,875 mg by mouth 2 (two) times daily with a meal.    [provider]  dicyclomine (BENTYL) 20 MG tablet Take 20 mg by mouth every 6 (six) hours.    [provider]  esomeprazole (NEXIUM) 40 MG capsule Take 40 mg by mouth daily before breakfast.    [provider]  furosemide (LASIX) 20 MG tablet Take 1 tablet (20 mg total) by mouth daily. 01/11/21   Prosperi, Christian H, PA-C  gabapentin (NEURONTIN) 300 MG capsule Take 300 mg by mouth 3 (three) times daily.    [provider]  hydrOXYzine (ATARAX/VISTARIL) 25 MG tablet Take 25 mg by mouth 3 (three) times daily as needed.    [provider]  levothyroxine (SYNTHROID, LEVOTHROID) 50 MCG tablet Take 125 mcg by mouth daily.     [provider]  magnesium oxide (MAG-OX) 400 MG tablet Take 400 mg by mouth daily.    [provider]  metoprolol succinate (TOPROL-XL) 25 MG 24 hr tablet Take 25 mg by mouth daily.    [provider]  montelukast (SINGULAIR) 10 MG tablet Take 10 mg by mouth at bedtime.    [provider]  oxyCODONE-acetaminophen (PERCOCET/ROXICET) 5-325 MG per tablet Take 1 tablet by mouth every 4 (four) hours as needed for pain. 02/27/12   Zadie Rhine, MD  potassium chloride (K-DUR,KLOR-CON) 10 MEQ tablet Take 10 mEq by mouth 2 (two) times daily.    [provider]  potassium citrate (UROCIT-K) 10 MEQ (1080 MG) SR tablet Take 120 mEq by mouth 4 (four) times daily.    [provider]  promethazine (PHENERGAN) 25 MG tablet Take 25 mg by mouth every 6 (six) hours as needed.    [provider]  rifaximin (XIFAXAN) 200 MG tablet Take 200 mg by mouth 3 (three) times daily.    [provider]  tizanidine (ZANAFLEX) 2 MG capsule Take 2 mg by mouth 3 (three) times daily.    [provider]  venlafaxine XR (EFFEXOR-XR) 150 MG 24 hr capsule Take 150 mg by mouth daily.    [provider]  Vitamin D, Ergocalciferol, (DRISDOL) 50000 UNITS CAPS Take 50,000 Units by mouth every 7 (seven) days.    [provider]    Physical Exam: Vitals:   04/30/22 1958 04/30/22 2038 04/30/22 2215  BP: (!) 93/50  (!) 111/45  Pulse: (!) 108  96  Resp: (!) 26  (!) 25  Temp: 98.8 F (37.1 C)    TempSrc: Oral    SpO2: 100%  100%  Weight:  112.5 kg   Height:  5\' 2"  (1.575 m)    Constitutional: NAD, chronically ill-appearing morbidly obese with generalized pallor female laying in bed Eyes:clouding/opacity of the left  conjunctiva ENMT: Mucous membranes are moist.  Neck: normal, supple,  Respiratory: clear to auscultation bilaterally, no wheezing, no crackles.  Dyspnea with exertion-movement in bed.  On room air. Cardiovascular: Regular rate and rhythm, no murmurs / rubs / gallops. No extremity edema.  Abdomen: Soft, nontender nondistended.. Bowel sounds positive.  Musculoskeletal: no clubbing / cyanosis. No joint deformity upper and lower extremities.  Normal muscle tone.  Skin: moist erythematous skin with superficial skin tears beneath the left breast.  Extensive erythema and moisture of the perineum/vulvovaginal region with severe pain during evaluation. Neurologic: CN 2-12 grossly intact.  Psychiatric: Normal judgment and insight. Alert and oriented x 3. Normal mood. Data Reviewed:  See HPI  Assessment and Plan: * Sepsis -pneumonia vs cellulitis -she has severe yeast infection beneath left breast and groin/vulvarvaginal region with potentially mild cellulitis. However also complaining of increase shortness of breath and cough started today concerning for pneumonia. CT chest/abd/pelvis with bilateral pleural effusion, atelectasis with pneumonia not excluded. Edema and anasarca of the abdomen.  -Will continue IV vancomycin and cefepime for now pending blood cultures given how ill appearing she is -keep on continuous IV fluid and monitor closely   Hypotension BP has been soft in 90/50 secondary to sepsis -has received several bolus. Will keep on continuous IV fluids. Will need to monitor fluid status closely since she has edema/anasarca noted on imaging secondary to her NASH cirrhosis -she also takes midodrine  TID PRN. May need to consider dose before any vasopressors if not improved with fluids -hold any home antihypertensives  Obesity, Class III, BMI 40-49.9 (morbid obesity) -BMI of 45  History of eye surgery -Blind in left eye with recent gunderson flap surgery on 4/19 with [facility]  ophthalmology.  -continue prescribed ophthalmic ointment TID  Candidal skin infection -left breast and extensive around perineum/vulvovaginal region -nystatin ointment BID -Diflucan x 1  AKI (acute kidney injury) -creatinine mildly elevated 1.20. Pre-renal from hypotension.  -follow trend with IV fluids  Liver cirrhosis secondary to NASH -has edema and anasarca on imaging but otherwise compensated -continue to monitor fluid status      Advance Care Planning: Full  Consults: none  Family Communication: son at bedside  Severity of Illness: The appropriate patient status for this patient is INPATIENT. Inpatient status is judged to be reasonable and necessary in order to provide the required intensity of service to ensure the patient's safety. The patient's presenting symptoms, physical exam findings, and initial radiographic and laboratory data in the context of their chronic comorbidities is felt to place them at high risk for further clinical deterioration. Furthermore, it is not anticipated that the patient will be medically stable for discharge from the hospital within 2 midnights of admission.   * I certify that at the point of admission it is my clinical judgment that the patient will require inpatient hospital care spanning beyond 2 midnights from the point of admission due to high intensity of service, high risk for further deterioration and high frequency of surveillance required.*  Author: Anselm Jungling, DO 05/01/2022 12:50 AM  For on call review [email or web address].

## 2022-04-30 NOTE — Progress Notes (Signed)
A consult was received from an ED physician for vancomycin per pharmacy dosing.  The patient's profile has been reviewed for ht/wt/allergies/indication/available labs.   A one time order has been placed for vanc 2g.  Further antibiotics/pharmacy consults should be ordered by admitting physician if indicated.                       Thank you, Berkley Harvey 04/30/2022  8:59 PM

## 2022-04-30 NOTE — ED Notes (Signed)
Unable to obtain 2 blood cultures. IV antibiotic infusion starting

## 2022-04-30 NOTE — ED Notes (Signed)
Pt on bedpan.

## 2022-04-30 NOTE — ED Provider Notes (Signed)
[place] EMERGENCY DEPARTMENT AT [facility] Provider Note   CSN: [id number] Arrival date & time: 04/30/22  1946     History  Chief Complaint  Patient presents with   Code Sepsis    [name] is a 67 y.o. female.  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  Her main complaint is a painful rash on the left side of her body underneath her breast and armpit all along her flank region.  Is worsened over the past week.  She had initial approval with some yeast topical medicine but it has spread and she has developed worsening pain with that and fever of 101 Fahrenheit today.  She has had chronic cough and shortness of breath on exertion.  She has had previous history of chest tubes on the left side to drain fluid collections on the left.  Also reports history of [place] cirrhosis.  Did have episode of nonbloody nonbilious emesis with coughing today but no severe worsening or new abdominal pain.  She is complaining of petechial rash on her lower extremities nonpainful in nature. No active chest pain.  HPI     Home Medications Prior to Admission medications   Medication Sig Start Date End Date Taking? Authorizing Provider  acetaminophen (TYLENOL) 325 MG tablet Take 650 mg by mouth every 6 (six) hours as needed. Patient used this medication for her headache.    [provider]  azithromycin (ZITHROMAX) 250 MG tablet Take 1 tablet (250 mg total) by mouth daily. Take first 2 tablets together, then 1 every day until finished. 01/11/21   Prosperi, Christian H, PA-C  colesevelam (WELCHOL) 625 MG tablet Take 1,875 mg by mouth 2 (two) times daily with a meal.    [provider]  dicyclomine (BENTYL) 20 MG tablet Take 20 mg by mouth every 6 (six) hours.    [provider]  esomeprazole (NEXIUM) 40 MG capsule Take 40 mg by  mouth daily before breakfast.    [provider]  furosemide (LASIX) 20 MG tablet Take 1 tablet (20 mg total) by mouth daily. 01/11/21   Prosperi, Christian H, PA-C  gabapentin (NEURONTIN) 300 MG capsule Take 300 mg by mouth 3 (three) times daily.    [provider]  hydrOXYzine (ATARAX/VISTARIL) 25 MG tablet Take 25 mg by mouth 3 (three) times daily as needed.    [provider]  levothyroxine (SYNTHROID, LEVOTHROID) 50 MCG tablet Take 125 mcg by mouth daily.     [provider]  magnesium oxide (MAG-OX) 400 MG tablet Take 400 mg by mouth daily.    [provider]  metoprolol succinate (TOPROL-XL) 25 MG 24 hr tablet Take 25 mg by mouth daily.    [provider]  montelukast (SINGULAIR) 10 MG tablet Take 10 mg by mouth at bedtime.    [provider]  oxyCODONE-acetaminophen (PERCOCET/ROXICET) 5-325 MG per tablet Take 1 tablet by mouth every 4 (four) hours as needed for pain. 02/27/12   Zadie Rhine, MD  potassium chloride (K-DUR,KLOR-CON) 10 MEQ tablet Take 10 mEq by mouth 2 (two) times daily.    [provider]  potassium citrate (UROCIT-K) 10 MEQ (1080 MG) SR tablet Take 120 mEq by mouth 4 (four) times daily.    [provider]  promethazine (PHENERGAN) 25 MG tablet Take 25 mg by mouth every 6 (six) hours as needed.    [provider]  rifaximin (XIFAXAN) 200 MG tablet Take 200 mg by mouth 3 (three) times daily.    [provider]  tizanidine (ZANAFLEX) 2 MG capsule Take 2 mg by mouth 3 (three) times daily.    [provider]  venlafaxine XR (EFFEXOR-XR) 150 MG 24 hr capsule Take 150 mg by mouth daily.    [provider]  Vitamin D, Ergocalciferol, (DRISDOL) 50000 UNITS CAPS Take 50,000 Units by mouth every 7 (seven) days.    [provider]      Allergies    Ivp dye [iodinated contrast media], Ace inhibitors, Doxycycline, Penicillins, and Zofran [ondansetron]     Review of Systems   Review of Systems  Physical Exam Updated Vital Signs BP (!) 111/45   Pulse 96   Temp 98.8 F (37.1 C) (Oral)   Resp (!) 25   Ht 5\' 2"  (1.575 m)   Wt 112.5 kg   SpO2 100%   BMI 45.36 kg/m  Physical Exam Constitutional: Alert and oriented.  Chronically ill-appearing but nontoxic Eyes: Conjunctivae are normal. ENT      Head: Normocephalic and atraumatic. Cardiovascular: S1, S2, tachycardic, regular rhythm.Warm and well perfused. Respiratory: Mildly tachypneic, breath sounds clear slightly decreased at bases, O2 sat mid 90s on room air Gastrointestinal: Distended but soft and nontender no rebound or guarding.  Prior surgical scars clean dry and intact. Musculoskeletal: Normal range of motion in all extremities. Nonpitting equal nontender edema bilateral lower extremities with nonblanching petechial lesions on bilateral shins Neurologic: Normal speech and language.  No facial droop.  Moving all 4 extremities equally.  Sensation grossly intact.  No gross focal neurologic deficits are appreciated. Skin: Tender erythematous indurated rash overlying patient's left armpit region, left flank, left hip and left torso.  No vesicular lesions.  No papules present.  Mild skin breakdown underneath left breast and left armpit without purulent discharge, no fluctuance present Psychiatric: Mood and affect are normal. Speech and behavior are normal.  ED Results / Procedures / Treatments   Labs (all labs ordered are listed, but only abnormal results are displayed) Labs Reviewed  CBC WITH DIFFERENTIAL/PLATELET - Abnormal; Notable for the following components:      Result Value   WBC 28.0 (*)    RBC 3.06 (*)    Hemoglobin 8.9 (*)    HCT 28.4 (*)    RDW 21.2 (*)    Neutro Abs 25.9 (*)    Abs Immature Granulocytes 0.31 (*)    All other components within normal limits  PROTIME-INR - Abnormal; Notable for the following components:   Prothrombin Time 26.3 (*)    INR 2.4 (*)     All other components within normal limits  APTT - Abnormal; Notable for the following components:   aPTT 44 (*)    All other components within normal limits  D-DIMER, QUANTITATIVE - Abnormal; Notable for the following components:   D-Dimer, Quant 3.29 (*)    All other components within normal limits  I-STAT CHEM 8, ED - Abnormal; Notable for the following components:   Creatinine, Ser 1.20 (*)    Calcium, Ion 1.13 (*)    TCO2 14 (*)    Hemoglobin 10.2 (*)    HCT 30.0 (*)    All other components within normal limits  CULTURE, BLOOD (ROUTINE X 2)  CULTURE, BLOOD (ROUTINE X 2)  URINE CULTURE  LACTIC ACID, PLASMA  LACTIC ACID, PLASMA  COMPREHENSIVE METABOLIC PANEL  FIBRINOGEN  TROPONIN I (HIGH SENSITIVITY)  TROPONIN I (HIGH SENSITIVITY)  EKG EKG Interpretation  Date/Time:  Saturday April 30 2022 20:36:06 EDT Ventricular Rate:  100 PR Interval:  177 QRS Duration: 80 QT Interval:  344 QTC Calculation: 444 R Axis:   16 Text Interpretation: Sinus tachycardia Confirmed by Vivien Rossetti ([id number]) on 04/30/2022 8:44:29 PM  Radiology CT CHEST ABDOMEN PELVIS WO CONTRAST  Result Date: 04/30/2022 CLINICAL DATA:  Sepsis.  Cough. EXAM: CT CHEST, ABDOMEN AND PELVIS WITHOUT CONTRAST TECHNIQUE: Multidetector CT imaging of the chest, abdomen and pelvis was performed following the standard protocol without IV contrast. RADIATION DOSE REDUCTION: This exam was performed according to the departmental dose-optimization program which includes automated exposure control, adjustment of the mA and/or kV according to patient size and/or use of iterative reconstruction technique. COMPARISON:  Chest CT dated 02/07/2022. FINDINGS: Evaluation of this exam is limited in the absence of intravenous contrast. CT CHEST FINDINGS Cardiovascular: There is no cardiomegaly or pericardial effusion. Advanced 3 vessel coronary vascular calcification. Mild atherosclerotic calcification of the thoracic aorta. No  aneurysmal dilatation. The central pulmonary arteries are grossly unremarkable on this noncontrast CT. Mediastinum/Nodes: No hilar or mediastinal adenopathy. The esophagus is grossly unremarkable. There is a 1.8 cm rim calcified right thyroid nodule. No mediastinal fluid collection. Lungs/Pleura: Trace bilateral pleural effusions. There is minimal associated compressive atelectasis of the left lower lobe. Pneumonia is not excluded. There is no consolidative changes or pneumothorax. The central airways are patent. Musculoskeletal: Osteopenia with degenerative changes of spine. No acute osseous pathology. CT ABDOMEN PELVIS FINDINGS No intra-abdominal free air.  Small ascites. Hepatobiliary: Irregular liver contour consistent with cirrhosis. No biliary dilatation. Cholecystectomy. Pancreas: The pancreas is unremarkable. Spleen: Normal in size without focal abnormality. Adrenals/Urinary Tract: The adrenal glands are unremarkable. Vascular calcification versus a 3 mm nonobstructing right renal upper pole calculus. No hydronephrosis. The left kidney is unremarkable. The urinary bladder is grossly unremarkable. Stomach/Bowel: There is postsurgical changes of the bowel with ileocolic anastomosis in the right hemiabdomen. No bowel obstruction. Vascular/Lymphatic: Moderate aortoiliac atherosclerotic disease. The IVC is unremarkable. No portal venous gas. No adenopathy. A tangle of dilated vessels in the abdomen consistent with varices and indicative of portal hypertension. Reproductive: The uterus is anteverted. Multiple calcified fibroids measure up to 6 cm. Other: There is diffuse subcutaneous edema. There is a 2 cm peritoneal defect in the left anterior pelvic wall with herniation of small amount of ascitic fluid. Musculoskeletal: Osteopenia with degenerative changes of the spine. No acute osseous pathology. IMPRESSION: 1. Trace bilateral pleural effusions with minimal associated compressive atelectasis of the left lower  lobe. Pneumonia is not excluded. 2. Cirrhosis with evidence of portal hypertension and small ascites. 3. Postsurgical changes of the bowel with ileocolic anastomosis in the right hemiabdomen. No bowel obstruction. 4. Diffuse subcutaneous edema and anasarca. 5.  Aortic Atherosclerosis (ICD10-I70.0). Electronically Signed   By: Elgie Collard M.D.   On: 04/30/2022 21:52   DG Chest Port 1 View  Result Date: 04/30/2022 CLINICAL DATA:  Sepsis EXAM: PORTABLE CHEST 1 VIEW COMPARISON:  X-ray 05/13/2021.  CT scan 02/07/2022 FINDINGS: Underinflation with enlarged cardiopericardial silhouette. Left midlung scar or atelectasis. No consolidation, pneumothorax. Decreasing left effusion. Films are under penetrated. Calcified aorta. Overlapping cardiac leads IMPRESSION: Underinflation. Left midlung scar or atelectasis. Improving left pleural effusion Electronically Signed   By: Karen Kays M.D.   On: 04/30/2022 20:39    Procedures .Critical Care  Performed by: Mardene Sayer, MD Authorized by: Mardene Sayer, MD   Critical care provider statement:    Critical  care time (minutes):  35   Critical care was necessary to treat or prevent imminent or life-threatening deterioration of the following conditions:  Sepsis   Critical care was time spent personally by me on the following activities:  Development of treatment plan with patient or surrogate, evaluation of patient's response to treatment, examination of patient, ordering and review of laboratory studies, ordering and review of radiographic studies, ordering and performing treatments and interventions, pulse oximetry, re-evaluation of patient's condition, review of old charts and obtaining history from patient or surrogate   Care discussed with: admitting provider       Medications Ordered in ED Medications  vancomycin (VANCOREADY) IVPB 2000 mg/400 mL (2,000 mg Intravenous New Bag/Given 04/30/22 2239)  sodium chloride 0.9 % bolus 1,000 mL (1,000  mLs Intravenous New Bag/Given 04/30/22 2301)  sodium chloride 0.9 % bolus 1,000 mL (0 mLs Intravenous Stopped 04/30/22 2309)  ceFEPIme (MAXIPIME) 2 g in sodium chloride 0.9 % 100 mL IVPB (0 g Intravenous Stopped 04/30/22 2310)  oxyCODONE (Oxy IR/ROXICODONE) immediate release tablet 5 mg (5 mg Oral Given 04/30/22 2240)    ED Course/ Medical Decision Making/ A&P Clinical Course as of 04/30/22 2317  Sat Apr 30, 2022  2225 HR improving high 90s, MAP> 65. Still getting IVF and antibiotics currently. [VB]  2248 S/w Dr Cyndia Bent regarding admission  [VB]    Clinical Course User Index [VB] Mardene Sayer, MD                             Medical Decision Making Callia Swim is a 67 y.o. female.  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   Main complaint is RASH.   Patient had documented fever 101 Fahrenheit at home.  She arrived hypotensive 93/50 tachycardic 108 and tachypneic to the 20s.  Initially placed on oxygen but weaned off with no desaturations.  Her presentation is concerning for sepsis likely secondary to suspected cellulitis of her left breast, left flank and armpit region.  It has associated mild skin breakdown and in areas consistent with candidal infection however suspect superimposed bacterial cellulitis.  No crepitus, no signs of necrosis, no rapid progression consistent with necrotizing skin and soft tissue infection.  No new medication changes or mucosal changes consistent with SJS/TEN.  Due to multiple underlying problems, doubted intra-abdominal pathology with no abdominal pain appreciated on exam however, obtain CT chest abdomen pelvis without contrast which showed no acute findings.  She has no significant abdominal pain on exam, no confusion, not consistent with SBP.   Trace bilateral pleural effusions with diffuse subcutaneous edema and  anasarca.  Likely due to underlying known cirrhosis.  Trace bilateral pleural effusions minimal compressive atelectasis of left lower lobe could be possible associated pneumonia but no significant respiratory issues however endorsing mild cough and dyspnea on exertion.  Due to history of cirrhosis, anasarca, holding off from initial 30 cc/kg IV fluid bolus.  Will start with broad-spectrum antibiotics and 2 L IV fluids and reassess. D/w Dr Cyndia Bent for admission for continued sepsis management of cellulitis.   Amount and/or Complexity of Data Reviewed Labs: ordered. Radiology: ordered.  Risk Prescription drug management. Decision regarding hospitalization.      Final Clinical Impression(s) / ED Diagnoses Final diagnoses:  Sepsis, due to unspecified organism, unspecified whether acute organ dysfunction present  Cellulitis of trunk, unspecified site of trunk  Rx / DC Orders ED Discharge Orders     None         Mardene Sayer, MD 04/30/22 2317

## 2022-05-01 ENCOUNTER — Other Ambulatory Visit: Payer: Self-pay

## 2022-05-01 ENCOUNTER — Encounter (HOSPITAL_COMMUNITY): Payer: Self-pay | Admitting: Family Medicine

## 2022-05-01 DIAGNOSIS — K7460 Unspecified cirrhosis of liver: Secondary | ICD-10-CM

## 2022-05-01 DIAGNOSIS — Z9889 Other specified postprocedural states: Secondary | ICD-10-CM

## 2022-05-01 DIAGNOSIS — A419 Sepsis, unspecified organism: Secondary | ICD-10-CM | POA: Diagnosis present

## 2022-05-01 DIAGNOSIS — B372 Candidiasis of skin and nail: Secondary | ICD-10-CM

## 2022-05-01 HISTORY — DX: Unspecified cirrhosis of liver: K74.60

## 2022-05-01 LAB — TROPONIN I (HIGH SENSITIVITY)
Troponin I (High Sensitivity): 13 ng/L (ref <18)
Troponin I (High Sensitivity): 23 ng/L — ABNORMAL HIGH (ref <18)

## 2022-05-01 LAB — COMPREHENSIVE METABOLIC PANEL
ALT: 26 U/L (ref 0–44)
AST: 43 U/L — ABNORMAL HIGH (ref 15–41)
Albumin: 1.6 g/dL — ABNORMAL LOW (ref 3.5–5.0)
Alkaline Phosphatase: 100 U/L (ref 38–126)
Anion gap: 15 (ref 5–15)
BUN: 21 mg/dL (ref 8–23)
CO2: 11 mmol/L — ABNORMAL LOW (ref 22–32)
Calcium: 7.8 mg/dL — ABNORMAL LOW (ref 8.9–10.3)
Chloride: 110 mmol/L (ref 98–111)
Creatinine, Ser: 1.51 mg/dL — ABNORMAL HIGH (ref 0.44–1.00)
GFR, Estimated: 38 mL/min — ABNORMAL LOW (ref >60)
Glucose, Bld: 94 mg/dL (ref 70–99)
Potassium: 3.6 mmol/L (ref 3.5–5.1)
Sodium: 136 mmol/L (ref 135–145)
Total Bilirubin: 1.8 mg/dL — ABNORMAL HIGH (ref 0.3–1.2)
Total Protein: 5.9 g/dL — ABNORMAL LOW (ref 6.5–8.1)

## 2022-05-01 LAB — CBC
HCT: 26.9 % — ABNORMAL LOW (ref 36.0–46.0)
Hemoglobin: 8.5 g/dL — ABNORMAL LOW (ref 12.0–15.0)
MCH: 28.8 pg (ref 26.0–34.0)
MCHC: 31.6 g/dL (ref 30.0–36.0)
MCV: 91.2 fL (ref 80.0–100.0)
Platelets: 116 10*3/uL — ABNORMAL LOW (ref 150–400)
RBC: 2.95 MIL/uL — ABNORMAL LOW (ref 3.87–5.11)
RDW: 21.2 % — ABNORMAL HIGH (ref 11.5–15.5)
WBC: 25.4 10*3/uL — ABNORMAL HIGH (ref 4.0–10.5)
nRBC: 0 % (ref 0.0–0.2)

## 2022-05-01 LAB — LACTIC ACID, PLASMA
Lactic Acid, Venous: 5.6 mmol/L (ref 0.5–1.9)
Lactic Acid, Venous: 5 mmol/L (ref 0.5–1.9)
Lactic Acid, Venous: 4 mmol/L (ref 0.5–1.9)
Lactic Acid, Venous: 3.5 mmol/L (ref 0.5–1.9)

## 2022-05-01 LAB — HIV ANTIBODY (ROUTINE TESTING W REFLEX): HIV Screen 4th Generation wRfx: NONREACTIVE

## 2022-05-01 LAB — MRSA NEXT GEN BY PCR, NASAL: MRSA by PCR Next Gen: NOT DETECTED

## 2022-05-01 MED ORDER — ENOXAPARIN SODIUM 60 MG/0.6ML IJ SOSY
0.5000 mg/kg | PREFILLED_SYRINGE | INTRAMUSCULAR | Status: DC
  Administered 2022-05-01 – 2022-05-07 (×6): 60 mg via SUBCUTANEOUS
  Filled 2022-05-01 (×8): qty 0.6

## 2022-05-01 MED ORDER — NYSTATIN 100000 UNIT/GM EX OINT
TOPICAL_OINTMENT | Freq: Two times a day (BID) | CUTANEOUS | Status: DC
  Administered 2022-05-06: 1 via TOPICAL
  Filled 2022-05-01 (×3): qty 15

## 2022-05-01 MED ORDER — AZITHROMYCIN 250 MG PO TABS
500.0000 mg | ORAL_TABLET | Freq: Every day | ORAL | Status: AC
  Administered 2022-05-01 – 2022-05-05 (×5): 500 mg via ORAL
  Filled 2022-05-01 (×5): qty 2

## 2022-05-01 MED ORDER — ENOXAPARIN SODIUM 40 MG/0.4ML IJ SOSY: 40.0000 mg | PREFILLED_SYRINGE | INTRAMUSCULAR | Status: DC

## 2022-05-01 MED ORDER — DIPHENHYDRAMINE HCL 25 MG PO CAPS
25.0000 mg | ORAL_CAPSULE | Freq: Once | ORAL | Status: AC
  Administered 2022-05-01: 25 mg via ORAL
  Filled 2022-05-01: qty 1

## 2022-05-01 MED ORDER — ALBUMIN HUMAN 25 % IV SOLN
12.5000 g | Freq: Once | INTRAVENOUS | Status: AC
  Administered 2022-05-01: 12.5 g via INTRAVENOUS
  Filled 2022-05-01: qty 50

## 2022-05-01 MED ORDER — CHLORHEXIDINE GLUCONATE CLOTH 2 % EX PADS: 6.0000 | MEDICATED_PAD | Freq: Every day | CUTANEOUS | Status: DC

## 2022-05-01 MED ORDER — SODIUM CHLORIDE 0.9 % IV SOLN
2.0000 g | Freq: Two times a day (BID) | INTRAVENOUS | Status: DC
  Administered 2022-05-01: 2 g via INTRAVENOUS
  Filled 2022-05-01: qty 12.5

## 2022-05-01 MED ORDER — LIP MEDEX EX OINT
1.0000 | TOPICAL_OINTMENT | CUTANEOUS | Status: DC | PRN
  Filled 2022-05-01 (×2): qty 7

## 2022-05-01 MED ORDER — SODIUM CHLORIDE 0.9 % IV BOLUS
500.0000 mL | Freq: Once | INTRAVENOUS | Status: AC
  Administered 2022-05-01: 500 mL via INTRAVENOUS

## 2022-05-01 MED ORDER — VANCOMYCIN HCL 1500 MG/300ML IV SOLN: 1500.0000 mg | INTRAVENOUS | Status: DC

## 2022-05-01 MED ORDER — LACTATED RINGERS IV SOLN: INTRAVENOUS | Status: DC

## 2022-05-01 MED ORDER — SODIUM CHLORIDE 0.9 % IV SOLN
2.0000 g | INTRAVENOUS | Status: DC
  Administered 2022-05-02: 2 g via INTRAVENOUS
  Filled 2022-05-01: qty 20

## 2022-05-01 MED ORDER — HYDROCORTISONE 1 % EX CREA
1.0000 | TOPICAL_CREAM | Freq: Three times a day (TID) | CUTANEOUS | Status: DC | PRN
  Filled 2022-05-01 (×2): qty 28

## 2022-05-01 MED ORDER — CHLORHEXIDINE GLUCONATE CLOTH 2 % EX PADS
6.0000 | MEDICATED_PAD | Freq: Every day | CUTANEOUS | Status: DC
  Administered 2022-05-02: 6 via TOPICAL

## 2022-05-01 MED ORDER — FLUCONAZOLE 150 MG PO TABS
150.0000 mg | ORAL_TABLET | Freq: Once | ORAL | Status: AC
  Administered 2022-05-01: 150 mg via ORAL
  Filled 2022-05-01: qty 1

## 2022-05-01 MED ORDER — OXYCODONE HCL 5 MG PO TABS
10.0000 mg | ORAL_TABLET | Freq: Four times a day (QID) | ORAL | Status: DC | PRN
  Administered 2022-05-01 – 2022-05-07 (×5): 10 mg via ORAL
  Filled 2022-05-01 (×4): qty 2

## 2022-05-01 MED ORDER — DIPHENHYDRAMINE HCL 25 MG PO CAPS
25.0000 mg | ORAL_CAPSULE | Freq: Four times a day (QID) | ORAL | Status: DC | PRN
  Administered 2022-05-02 – 2022-05-03 (×3): 25 mg via ORAL
  Filled 2022-05-01 (×4): qty 1

## 2022-05-01 MED ORDER — HYDROMORPHONE HCL 1 MG/ML IJ SOLN
0.5000 mg | INTRAMUSCULAR | Status: DC | PRN
  Administered 2022-05-01 – 2022-05-04 (×4): 0.5 mg via INTRAVENOUS
  Filled 2022-05-01: qty 1
  Filled 2022-05-01: qty 0.5
  Filled 2022-05-01 (×2): qty 1

## 2022-05-01 MED ORDER — FLUCONAZOLE 150 MG PO TABS
150.0000 mg | ORAL_TABLET | Freq: Every day | ORAL | Status: AC
  Administered 2022-05-02 – 2022-05-07 (×6): 150 mg via ORAL
  Filled 2022-05-01 (×6): qty 1

## 2022-05-01 MED ORDER — DIPHENHYDRAMINE-ZINC ACETATE 2-0.1 % EX CREA
TOPICAL_CREAM | Freq: Three times a day (TID) | CUTANEOUS | Status: DC | PRN
  Administered 2022-05-01: 1 via TOPICAL
  Filled 2022-05-01 (×3): qty 28

## 2022-05-01 NOTE — Assessment & Plan Note (Signed)
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  -continue prescribed ophthalmic ointment TID

## 2022-05-01 NOTE — Progress Notes (Signed)
Pharmacy Antibiotic Note  [name] is a 67 y.o. female admitted on 04/30/2022 with  medical history significant of HTN, NASH cirrhosis, PE/DVT not on anticoagulation, multiple abdominal surgeries who presents with fever and confusion. Marland Kitchen  Pharmacy has been consulted for vancomycin and cefepime for sepsis. 1st doses given in the ED  Plan: Vancomycin  IV q36h (AUC 500.9, Scr 1.2) Cefepime 2gm IV q12h Follow renal function, cultures and clinical course  Height:  (157.5 cm) Weight: 112.5 kg (248 lb) IBW/kg (Calculated) : 50.1  Temp (24hrs), Avg:98.8 F (37.1 C), Min:98.8 F (37.1 C), Max:98.8 F (37.1 C)  Recent Labs  Lab 04/30/22 2019 04/30/22 2034 04/30/22 2244  WBC 28.0*  --   --   CREATININE  --  1.20*  --   LATICACIDVEN  --   --  5.0*    Estimated Creatinine Clearance: 56.2 mL/min (A) (by C-G formula based on SCr of 1.2 mg/dL (H)).    Allergies  Allergen Reactions   Ivp Dye [Iodinated Contrast Media] Anaphylaxis   Ace Inhibitors Swelling   Doxycycline Rash   Penicillins Rash    Did it involve swelling of the face/tongue/throat, SOB, or low BP? No Did it involve sudden or severe rash/hives, skin peeling, or any reaction on the inside of your mouth or nose? Yes Did you need to seek medical attention at a hospital or doctor's office? No When did it last happen? Several Years Ago      If all above answers are "NO", may proceed with cephalosporin use.     Zofran [Ondansetron] Rash    Antimicrobials this admission: 4/20 vanc >> 4/20 cefepime >>  Dose adjustments this admission:   Microbiology results: 4/20 BCx:  4/20 Ucx:  Thank you for allowing pharmacy to be a part of this patient's care.  Arley Phenix RPh 05/01/2022, 12:51 AM

## 2022-05-01 NOTE — Progress Notes (Signed)
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  Became febrile up to 101 F at home, little confused to her son.Has been dealing with yeast infection beneath left breast and all around her groin/vulvarvaginal region. Just started doing nystatin powder.  On presentation ,she was hypotensive, tachycardic.  Lab work showed WC count of 28,000, elevated lactic acid level up to the range of 5.  Chest x-ray did not show any pneumonia.  CT chest/abdomen/pelvis showed trace bilateral pleural effusion, atelectasis of left lower lobe but pneumonia not excluded.  Found to have edema/erythema around left breast, vulvovaginal area.  Patient was admitted for the management of septic shock likely from cellulitis.  Started on broad spectrum antibiotics, antifungal .blood culture sent.  Assessment & Plan:  Principal Problem:   Sepsis Active Problems:   Hypotension   Liver cirrhosis secondary to NASH   AKI (acute kidney injury)   Candidal skin infection   History of eye surgery   Obesity, Class III, BMI 40-49.9 (morbid obesity)  Septic shock: Presented with fever, hypotension, tachycardia, lactic acidosis.  No clear source but chest imaging could not rule out left lower lobe infiltrate.  Also found to have fungal infection consistent with erythema/swelling of left breast, groin/vulvovaginal area.  Started on broad-spectrum antibiotics.  Blood cultures have been sent, will follow-up. Continue gentle IV fluids for today.  Also takes midodrine at home, restarted.  Liver cirrhosis/NASH: Has edema/anasarca.  Started on gentle IV fluids due to  hypotension on presentation.  Takes Lasix, spironolactone at home.  Will restart whenever appropriate.  On rifaximin, restarted.  Recent history of eye surgery:Blind in left eye with recent gunderson flap surgery on 4/19 with [facility] ophthalmology.  Continue antibiotic ophthalmic ointment  Fungal skin infection: Found to have erythema/edema around left breast, perineum/groin.  Continue nystatin ointment.  Continue fluconazole  AKI: Creatinine of 1.2 on presentation.  Continue gentle IV fluids  Normocytic anemia: Hemoglobin in the range of 8.5.  Hemoglobin dropped from 10.2 most likely from hemodilution.  Continue to monitor.  Also has mild thrombocytopenia: Most likely secondary to history of cirrhosis.  Hyperlipidemia: On Lipitor  History of gout: On colchicine as needed.  Hypothyroidism: On Synthyroid  History of depression:on  Venlafaxine.  Morbid obesity: BMI of 48.3       DVT prophylaxis:Lovenox     Code Status: Full Code  Family Communication: Called and discussed with son on phone on 4/21  Patient status:Inpatient  Patient is from :Home  Anticipated discharge NF:AOZH  Estimated DC date:2-3 days   Consultants: None  Procedures:None  Antimicrobials:  Anti-infectives (From admission, onward)    Start     Dose/Rate Route Frequency Ordered Stop   05/02/22 1000  vancomycin (VANCOREADY) IVPB 1500 mg/300 mL        1,500 mg 150 mL/hr over 120 Minutes Intravenous Every 36 hours 05/01/22 0055     05/01/22 1000  ceFEPIme (MAXIPIME) 2 g in sodium chloride 0.9 % 100 mL IVPB        2 g 200 mL/hr over 30 Minutes Intravenous Every 12 hours 05/01/22 0055     05/01/22 0030  fluconazole (DIFLUCAN) tablet 150 mg  150 mg Oral  Once 05/01/22 0027 05/01/22 0249   05/01/22 0000  rifaximin (XIFAXAN) tablet 550 mg        550 mg Oral 2 times daily 04/30/22 2346     04/30/22 2115  vancomycin (VANCOREADY) IVPB 2000 mg/400 mL        2,000 mg 200 mL/hr over 120 Minutes  Intravenous  Once 04/30/22 2100 05/01/22 0053   04/30/22 2100  ceFEPIme (MAXIPIME) 2 g in sodium chloride 0.9 % 100 mL IVPB        2 g 200 mL/hr over 30 Minutes Intravenous  Once 04/30/22 2057 04/30/22 2310       Subjective: Patient seen and examined at bedside today.  Her blood pressure has improved this morning.  She feels better.  Currently she is alert and oriented.  Complains of pain on the left breast area, left flank and vulvovaginal area  Objective: Vitals:   05/01/22 0400 05/01/22 0401 05/01/22 0500 05/01/22 0600  BP: (!) 90/32 (!) 94/56 (!) 98/52 (!) 103/57  Pulse: 79 81 78 78  Resp: 19 (!) 21 (!) 22 (!) 25  Temp:      TempSrc:      SpO2: 100% 100% 100% 100%  Weight:      Height:        Intake/Output Summary (Last 24 hours) at 05/01/2022 0740 Last data filed at 05/01/2022 0646 Gross per 24 hour  Intake 2146.8 ml  Output --  Net 2146.8 ml   Filed Weights   04/30/22 2038 05/01/22 0351  Weight: 112.5 kg 119.8 kg    Examination:  General exam: Overall comfortable, not in distress, morbidly obese HEENT: PERRL Respiratory system:  no wheezes or crackles  Cardiovascular system: S1 & S2 heard, RRR.  Gastrointestinal system: Abdomen is nondistended, soft and nontender.  Ventral hernias Central nervous system: Alert and oriented Extremities: Skin edema, no clubbing ,no cyanosis Skin: Redness/fungal rash on the left breast, left flank, vulvovaginal area   Data Reviewed: I have personally reviewed following labs and imaging studies  CBC: Recent Labs  Lab 04/30/22 2019 04/30/22 2034 05/01/22 0620  WBC 28.0*  --  25.4*  NEUTROABS 25.9*  --   --   HGB 8.9* 10.2* 8.5*  HCT 28.4* 30.0* 26.9*  MCV 92.8  --  91.2  PLT 195  --  116*   Basic Metabolic Panel: Recent Labs  Lab 04/30/22 2019 04/30/22 2034  NA 136 137  K 3.6 3.7  CL 110 109  CO2 11*  --   GLUCOSE 94 97  BUN 21 20  CREATININE 1.51* 1.20*  CALCIUM 7.8*  --      Recent Results (from the past  240 hour(s))  MRSA Next Gen by PCR, Nasal     Status: None   Collection Time: 05/01/22  4:11 AM   Specimen: Nasal Mucosa; Nasal Swab  Result Value Ref Range Status   MRSA by PCR Next Gen NOT DETECTED NOT DETECTED Final    Comment: (NOTE) The GeneXpert MRSA Assay (FDA approved for NASAL specimens only), is one component of a comprehensive MRSA colonization surveillance program. It is not intended to diagnose MRSA infection nor to guide or monitor treatment for MRSA infections. Test performance is not FDA approved in patients less than 45 years old. Performed at [facility], [address] [place], [place] [address]      Radiology Studies: CT CHEST ABDOMEN PELVIS WO CONTRAST  Result Date: 04/30/2022 CLINICAL DATA:  Sepsis.  Cough.  EXAM: CT CHEST, ABDOMEN AND PELVIS WITHOUT CONTRAST TECHNIQUE: Multidetector CT imaging of the chest, abdomen and pelvis was performed following the standard protocol without IV contrast. RADIATION DOSE REDUCTION: This exam was performed according to the departmental dose-optimization program which includes automated exposure control, adjustment of the mA and/or kV according to patient size and/or use of iterative reconstruction technique. COMPARISON:  Chest CT dated 02/07/2022. FINDINGS: Evaluation of this exam is limited in the absence of intravenous contrast. CT CHEST FINDINGS Cardiovascular: There is no cardiomegaly or pericardial effusion. Advanced 3 vessel coronary vascular calcification. Mild atherosclerotic calcification of the thoracic aorta. No aneurysmal dilatation. The central pulmonary arteries are grossly unremarkable on this noncontrast CT. Mediastinum/Nodes: No hilar or mediastinal adenopathy. The esophagus is grossly unremarkable. There is a 1.8 cm rim calcified right thyroid nodule. No mediastinal fluid collection. Lungs/Pleura: Trace bilateral pleural effusions. There is minimal associated compressive atelectasis of the left lower  lobe. Pneumonia is not excluded. There is no consolidative changes or pneumothorax. The central airways are patent. Musculoskeletal: Osteopenia with degenerative changes of spine. No acute osseous pathology. CT ABDOMEN PELVIS FINDINGS No intra-abdominal free air.  Small ascites. Hepatobiliary: Irregular liver contour consistent with cirrhosis. No biliary dilatation. Cholecystectomy. Pancreas: The pancreas is unremarkable. Spleen: Normal in size without focal abnormality. Adrenals/Urinary Tract: The adrenal glands are unremarkable. Vascular calcification versus a 3 mm nonobstructing right renal upper pole calculus. No hydronephrosis. The left kidney is unremarkable. The urinary bladder is grossly unremarkable. Stomach/Bowel: There is postsurgical changes of the bowel with ileocolic anastomosis in the right hemiabdomen. No bowel obstruction. Vascular/Lymphatic: Moderate aortoiliac atherosclerotic disease. The IVC is unremarkable. No portal venous gas. No adenopathy. A tangle of dilated vessels in the abdomen consistent with varices and indicative of portal hypertension. Reproductive: The uterus is anteverted. Multiple calcified fibroids measure up to 6 cm. Other: There is diffuse subcutaneous edema. There is a 2 cm peritoneal defect in the left anterior pelvic wall with herniation of small amount of ascitic fluid. Musculoskeletal: Osteopenia with degenerative changes of the spine. No acute osseous pathology. IMPRESSION: 1. Trace bilateral pleural effusions with minimal associated compressive atelectasis of the left lower lobe. Pneumonia is not excluded. 2. Cirrhosis with evidence of portal hypertension and small ascites. 3. Postsurgical changes of the bowel with ileocolic anastomosis in the right hemiabdomen. No bowel obstruction. 4. Diffuse subcutaneous edema and anasarca. 5.  Aortic Atherosclerosis (ICD10-I70.0). Electronically Signed   By: Elgie Collard M.D.   On: 04/30/2022 21:52   DG Chest Port 1  View  Result Date: 04/30/2022 CLINICAL DATA:  Sepsis EXAM: PORTABLE CHEST 1 VIEW COMPARISON:  X-ray 05/13/2021.  CT scan 02/07/2022 FINDINGS: Underinflation with enlarged cardiopericardial silhouette. Left midlung scar or atelectasis. No consolidation, pneumothorax. Decreasing left effusion. Films are under penetrated. Calcified aorta. Overlapping cardiac leads IMPRESSION: Underinflation. Left midlung scar or atelectasis. Improving left pleural effusion Electronically Signed   By: Karen Kays M.D.   On: 04/30/2022 20:39    Scheduled Meds:  Chlorhexidine Gluconate Cloth  6 each Topical Daily   enoxaparin (LOVENOX) injection  0.5 mg/kg Subcutaneous Q24H   levothyroxine  150 mcg Oral Q0600   neomycin-polymyxin b-dexamethasone   Left Eye QID   nystatin ointment   Topical BID   rifaximin  550 mg Oral BID   venlafaxine XR  75 mg Oral Daily   Continuous Infusions:  ceFEPime (MAXIPIME) IV     lactated ringers 75 mL/hr at 05/01/22 0646   [START ON 05/02/2022] vancomycin  LOS: 1 day   Burnadette Pop, MD [facility] P4/21/2024, 7:40 AM

## 2022-05-01 NOTE — Assessment & Plan Note (Signed)
-  left breast and extensive around perineum/vulvovaginal region -nystatin ointment BID -Diflucan x 1

## 2022-05-01 NOTE — Assessment & Plan Note (Signed)
-  creatinine mildly elevated 1.20. Pre-renal from hypotension.  -follow trend with IV fluids

## 2022-05-01 NOTE — Assessment & Plan Note (Signed)
-  has edema and anasarca on imaging but otherwise compensated -continue to monitor fluid status

## 2022-05-01 NOTE — ED Notes (Signed)
Pt used bedpan had a bm slightly loose cleaned with soap and water barrier cream applied

## 2022-05-01 NOTE — Assessment & Plan Note (Signed)
BP has been soft in 90/50 secondary to sepsis -has received several bolus. Will keep on continuous IV fluids. Will need to monitor fluid status closely since she has edema/anasarca noted on imaging secondary to her NASH cirrhosis -she also takes midodrine  TID PRN. May need to consider dose before any vasopressors if not improved with fluids -hold any home antihypertensives

## 2022-05-01 NOTE — Assessment & Plan Note (Addendum)
-  pneumonia vs cellulitis -she has severe yeast infection beneath left breast and groin/vulvarvaginal region with potentially mild cellulitis. However also complaining of increase shortness of breath and cough started today concerning for pneumonia. CT chest/abd/pelvis with bilateral pleural effusion, atelectasis with pneumonia not excluded. Edema and anasarca of the abdomen.  -Will continue IV vancomycin and cefepime for now pending blood cultures given how ill appearing she is -keep on continuous IV fluid and monitor closely

## 2022-05-01 NOTE — Assessment & Plan Note (Signed)
-  BMI of 45

## 2022-05-02 DIAGNOSIS — A419 Sepsis, unspecified organism: Secondary | ICD-10-CM | POA: Diagnosis not present

## 2022-05-02 LAB — URINALYSIS, W/ REFLEX TO CULTURE (INFECTION SUSPECTED)
Bacteria, UA: NONE SEEN
Bilirubin Urine: NEGATIVE
Glucose, UA: NEGATIVE mg/dL
Ketones, ur: NEGATIVE mg/dL
Leukocytes,Ua: NEGATIVE
Nitrite: NEGATIVE
Protein, ur: NEGATIVE mg/dL
Specific Gravity, Urine: 1.017 (ref 1.005–1.030)
pH: 5 (ref 5.0–8.0)

## 2022-05-02 LAB — IRON AND TIBC
Iron: 15 ug/dL — ABNORMAL LOW (ref 28–170)
Saturation Ratios: 10 % — ABNORMAL LOW (ref 10.4–31.8)
TIBC: 146 ug/dL — ABNORMAL LOW (ref 250–450)
UIBC: 131 ug/dL

## 2022-05-02 LAB — BLOOD CULTURE ID PANEL (REFLEXED) - BCID2

## 2022-05-02 LAB — CBC
HCT: 23.7 % — ABNORMAL LOW (ref 36.0–46.0)
Hemoglobin: 7.3 g/dL — ABNORMAL LOW (ref 12.0–15.0)
MCH: 28.7 pg (ref 26.0–34.0)
MCHC: 30.8 g/dL (ref 30.0–36.0)
MCV: 93.3 fL (ref 80.0–100.0)
Platelets: 155 10*3/uL (ref 150–400)
RBC: 2.54 MIL/uL — ABNORMAL LOW (ref 3.87–5.11)
RDW: 21 % — ABNORMAL HIGH (ref 11.5–15.5)
WBC: 25.1 10*3/uL — ABNORMAL HIGH (ref 4.0–10.5)
nRBC: 0 % (ref 0.0–0.2)

## 2022-05-02 LAB — COMPREHENSIVE METABOLIC PANEL
ALT: 23 U/L (ref 0–44)
AST: 25 U/L (ref 15–41)
Albumin: 1.8 g/dL — ABNORMAL LOW (ref 3.5–5.0)
Alkaline Phosphatase: 83 U/L (ref 38–126)
Anion gap: 12 (ref 5–15)
BUN: 23 mg/dL (ref 8–23)
CO2: 13 mmol/L — ABNORMAL LOW (ref 22–32)
Calcium: 7.3 mg/dL — ABNORMAL LOW (ref 8.9–10.3)
Chloride: 108 mmol/L (ref 98–111)
Creatinine, Ser: 1.26 mg/dL — ABNORMAL HIGH (ref 0.44–1.00)
GFR, Estimated: 48 mL/min — ABNORMAL LOW (ref >60)
Glucose, Bld: 88 mg/dL (ref 70–99)
Potassium: 4.3 mmol/L (ref 3.5–5.1)
Sodium: 133 mmol/L — ABNORMAL LOW (ref 135–145)
Total Bilirubin: 1.4 mg/dL — ABNORMAL HIGH (ref 0.3–1.2)
Total Protein: 5.3 g/dL — ABNORMAL LOW (ref 6.5–8.1)

## 2022-05-02 LAB — AMMONIA: Ammonia: 47 umol/L — ABNORMAL HIGH (ref 9–35)

## 2022-05-02 LAB — CULTURE, BLOOD (ROUTINE X 2): Special Requests: ADEQUATE

## 2022-05-02 LAB — URINE CULTURE

## 2022-05-02 LAB — LACTIC ACID, PLASMA: Lactic Acid, Venous: 2.9 mmol/L (ref 0.5–1.9)

## 2022-05-02 MED ORDER — SODIUM CHLORIDE 0.9 % IV SOLN
2.0000 g | Freq: Two times a day (BID) | INTRAVENOUS | Status: DC
  Administered 2022-05-02 – 2022-05-04 (×4): 2 g via INTRAVENOUS
  Filled 2022-05-02 (×4): qty 12.5

## 2022-05-02 MED ORDER — LACTULOSE 10 GM/15ML PO SOLN
10.0000 g | Freq: Three times a day (TID) | ORAL | Status: DC
  Administered 2022-05-02 – 2022-05-03 (×3): 10 g via ORAL
  Filled 2022-05-02 (×3): qty 15

## 2022-05-02 MED ORDER — MIDODRINE HCL 5 MG PO TABS
5.0000 mg | ORAL_TABLET | Freq: Three times a day (TID) | ORAL | Status: DC
  Administered 2022-05-02 – 2022-05-03 (×3): 5 mg via ORAL
  Filled 2022-05-02 (×3): qty 1

## 2022-05-02 MED ORDER — VANCOMYCIN HCL IN DEXTROSE 1-5 GM/200ML-% IV SOLN
1000.0000 mg | INTRAVENOUS | Status: DC
  Administered 2022-05-02 – 2022-05-04 (×3): 1000 mg via INTRAVENOUS
  Filled 2022-05-02 (×3): qty 200

## 2022-05-02 MED ORDER — FAMOTIDINE 20 MG PO TABS
20.0000 mg | ORAL_TABLET | Freq: Every day | ORAL | Status: DC
  Administered 2022-05-02 – 2022-05-03 (×2): 20 mg via ORAL
  Filled 2022-05-02 (×2): qty 1

## 2022-05-02 MED ORDER — PREDNISONE 20 MG PO TABS
60.0000 mg | ORAL_TABLET | Freq: Every day | ORAL | Status: DC
  Administered 2022-05-02 – 2022-05-03 (×2): 60 mg via ORAL
  Filled 2022-05-02 (×2): qty 3

## 2022-05-02 MED ORDER — SODIUM CHLORIDE 0.9 % IV SOLN
510.0000 mg | Freq: Once | INTRAVENOUS | Status: AC
  Administered 2022-05-02: 510 mg via INTRAVENOUS
  Filled 2022-05-02: qty 17

## 2022-05-02 NOTE — Progress Notes (Addendum)
       Overnight   NAME: [name] MRN: [id number] DOB : 05-14-1955    Date of Service   05/02/2022   HPI/Events of Note   Notified by RN for hypotension  Albumin noted to be 1.6 on last check. Patient takes Midodrine .    Interventions/ Plan   Albumin ordered Re-assess BP ongoing        Update:   Bp responded to Albumin   113/49 (67)   Chinita Greenland BSN MSNA MSN ACNPC-AG Acute Care Nurse Practitioner [facility]

## 2022-05-02 NOTE — Progress Notes (Signed)
PHARMACY - PHYSICIAN COMMUNICATION CRITICAL VALUE ALERT - BLOOD CULTURE IDENTIFICATION (BCID)  [name] is an 67 y.o. female who presented to [facility] on 04/30/2022 with a chief complaint of fever and confusion.    Assessment:  She was started on Cefepime and Vancomycin for cellulitis, fungal rash, and CAP on admission, narrowed to ceftriaxone, azithromycin.  On 4/22 she is noted to have worsening of her rash on her back, Blood cultures are growing GNR with no results on BCID.   Name of physician (or Provider) Contacted: Dr. Renford Dills  Current antibiotics: Ceftriaxone, Azithromycin, Fluconazole  Changes to prescribed antibiotics recommended:  Recommendations accepted by provider Change Ceftriaxone to Cefepime 2g IV q12h  Results for orders placed or performed during the hospital encounter of 04/30/22  Blood Culture ID Panel (Reflexed) (Collected: 04/30/2022  8:19 PM)  Result Value Ref Range   Enterococcus faecalis NOT DETECTED NOT DETECTED   Enterococcus Faecium NOT DETECTED NOT DETECTED   Listeria monocytogenes NOT DETECTED NOT DETECTED   Staphylococcus species NOT DETECTED NOT DETECTED   Staphylococcus aureus (BCID) NOT DETECTED NOT DETECTED   Staphylococcus epidermidis NOT DETECTED NOT DETECTED   Staphylococcus lugdunensis NOT DETECTED NOT DETECTED   Streptococcus species NOT DETECTED NOT DETECTED   Streptococcus agalactiae NOT DETECTED NOT DETECTED   Streptococcus pneumoniae NOT DETECTED NOT DETECTED   Streptococcus pyogenes NOT DETECTED NOT DETECTED   A.calcoaceticus-baumannii NOT DETECTED NOT DETECTED   Bacteroides fragilis NOT DETECTED NOT DETECTED   Enterobacterales NOT DETECTED NOT DETECTED   Enterobacter cloacae complex NOT DETECTED NOT DETECTED   Escherichia coli NOT DETECTED NOT DETECTED   Klebsiella aerogenes NOT DETECTED NOT DETECTED   Klebsiella oxytoca NOT DETECTED NOT DETECTED   Klebsiella pneumoniae NOT DETECTED NOT DETECTED   Proteus species NOT DETECTED  NOT DETECTED   Salmonella species NOT DETECTED NOT DETECTED   Serratia marcescens NOT DETECTED NOT DETECTED   Haemophilus influenzae NOT DETECTED NOT DETECTED   Neisseria meningitidis NOT DETECTED NOT DETECTED   Pseudomonas aeruginosa NOT DETECTED NOT DETECTED   Stenotrophomonas maltophilia NOT DETECTED NOT DETECTED   Candida albicans NOT DETECTED NOT DETECTED   Candida auris NOT DETECTED NOT DETECTED   Candida glabrata NOT DETECTED NOT DETECTED   Candida krusei NOT DETECTED NOT DETECTED   Candida parapsilosis NOT DETECTED NOT DETECTED   Candida tropicalis NOT DETECTED NOT DETECTED   Cryptococcus neoformans/gattii NOT DETECTED NOT DETECTED    Lynann Beaver PharmD, BCPS [facility] main pharmacy [phone] 05/02/2022 10:52 AM

## 2022-05-02 NOTE — Progress Notes (Signed)
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  Became febrile up to 101 F at home, little confused to her son.Has been dealing with yeast infection beneath left breast and all around her groin/vulvarvaginal region. Just started doing nystatin powder.  On presentation ,she was hypotensive, tachycardic.  Lab work showed WC count of 28,000, elevated lactic acid level up to the range of 5.  Chest x-ray did not show any pneumonia.  CT chest/abdomen/pelvis showed trace bilateral pleural effusion, atelectasis of left lower lobe but pneumonia not excluded.  Found to have edema/erythema around left breast, left flank ,vulvovaginal area.  Patient was admitted for the management of septic shock likely from cellulitis.  Started on broad spectrum antibiotics, antifungal .blood culture sent.  Assessment & Plan:  Principal Problem:   Sepsis Active Problems:   Hypotension   Liver cirrhosis secondary to NASH   AKI (acute kidney injury)   Candidal skin infection   History of eye surgery   Obesity, Class III, BMI 40-49.9 (morbid obesity)  Septic shock: Presented with fever, hypotension, tachycardia, lactic acidosis.  Likely from cellulitis  but chest imaging could not rule out left lower lobe infiltrate. Found to have erythema/swelling of left breast, left flank ,groin/vulvovaginal area.  Started on broad-spectrum antibiotics.   Has severe leukocytosis.  Continue vancomycin, ceftriaxone.  Follow-up blood cultures showing gram-negative rods but BCID  is negative.  Continue antifungal Also takes midodrine at home as needed, restarted as  5mg   tid  Liver cirrhosis/NASH: Has edema/anasarca.  Started on gentle IV fluids due to hypotension on presentation.  Takes Lasix, spironolactone at home.  Currently on hold due to soft blood pressure.  On rifaximin, restarted.  Checking ammonia  Recent history of eye surgery:Blind in left eye with recent gunderson flap surgery on 4/19 with [facility] ophthalmology.  Continue antibiotic ophthalmic ointment  Itchy rash: Complains of intense itchy rash on her back.  Noticed to have erythematous rash on the back.  Local ointment/oral Benadryl not helping.  Started on prednisone  AKI: Creatinine of 1.5 on presentation.  Improving  Normocytic anemia: Hemoglobin in the range of 7.3.  Hemoglobin dropped from 10.2 most likely from hemodilution.  Continue to monitor.  Also has mild thrombocytopenia: Most likely secondary to history of cirrhosis.  Hyperlipidemia: On Lipitor  History of gout: On colchicine as needed.  Hypothyroidism: On Synthyroid  History of depression:on  Venlafaxine.  Morbid obesity: BMI of 48.3  Deconditioning/debility: Ambulates with the help of walker.  Will consult PT when appropriate       DVT prophylaxis:Lovenox     Code Status: Full Code  Family Communication: Called and discussed with son on phone on 4/21.Called again toda,call not received  Patient status:Inpatient  Patient is from :Home  Anticipated discharge WJ:XBJY  Estimated DC date:2-3 days   Consultants: None  Procedures:None  Antimicrobials:  Anti-infectives (From admission, onward)    Start     Dose/Rate Route Frequency Ordered Stop   05/02/22 1000  vancomycin (VANCOREADY) IVPB 1500 mg/300 mL        1,500 mg 150 mL/hr over 120 Minutes Intravenous Every 36 hours 05/01/22 0055     05/01/22 1000  ceFEPIme (MAXIPIME) 2 g in sodium chloride 0.9 % 100 mL IVPB        2 g 200 mL/hr over 30 Minutes Intravenous Every 12 hours 05/01/22 0055     05/01/22 0030  fluconazole (DIFLUCAN) tablet 150 mg         150 mg Oral  Once 05/01/22 0027 05/01/22 0249   05/01/22 0000  rifaximin (XIFAXAN) tablet 550 mg        550 mg Oral 2 times daily 04/30/22 2346     04/30/22 2115  vancomycin (VANCOREADY) IVPB 2000 mg/400 mL        2,000 mg 200 mL/hr over 120 Minutes Intravenous  Once 04/30/22 2100 05/01/22 0053   04/30/22 2100  ceFEPIme (MAXIPIME) 2 g in sodium chloride 0.9 % 100 mL IVPB        2 g 200 mL/hr over 30 Minutes Intravenous  Once 04/30/22 2057 04/30/22 2310       Subjective: Patient seen and examined the bedside this morning.  Hemodynamically stable.  Complains of intense rash which is itchy on her back.  She says her pain is better but he still has significant pain on her left flank.  Afebrile.  Objective: Vitals:   05/01/22 0400 05/01/22 0401 05/01/22 0500 05/01/22 0600  BP: (!) 90/32 (!) 94/56 (!) 98/52 (!) 103/57  Pulse: 79 81 78 78  Resp: 19 (!) 21 (!) 22 (!) 25  Temp:      TempSrc:      SpO2: 100% 100% 100% 100%  Weight:      Height:        Intake/Output Summary (Last 24 hours) at 05/01/2022 0740 Last data filed at 05/01/2022 0646 Gross per 24 hour  Intake 2146.8 ml  Output --  Net 2146.8 ml   Filed Weights   04/30/22 2038 05/01/22 0351  Weight: 112.5 kg 119.8 kg    Examination:   General exam: Overall comfortable, not in distress, morbidly obese HEENT: PERRL Respiratory system:  no wheezes or crackles  Cardiovascular system: S1 & S2 heard, RRR.  Gastrointestinal system: Abdomen is nondistended, soft and nontender.  Ventral hernias Central nervous system: Alert and oriented Extremities: no clubbing ,no cyanosis Skin: Erythematous paretic rash on the back, scattered purpura on the lower extremities and on the trunk.  Edema/erythematous areas on the left breast, vulvovaginal, left flank   Data Reviewed: I have personally reviewed following labs and imaging studies  CBC: Recent Labs  Lab 04/30/22 2019 04/30/22 2034 05/01/22 0620  WBC 28.0*  --  25.4*   NEUTROABS 25.9*  --   --   HGB 8.9* 10.2* 8.5*  HCT 28.4* 30.0* 26.9*  MCV 92.8  --  91.2  PLT 195  --  116*   Basic Metabolic Panel: Recent Labs  Lab 04/30/22 2019 04/30/22 2034  NA 136 137  K 3.6 3.7  CL 110 109  CO2 11*  --   GLUCOSE 94 97  BUN 21 20  CREATININE 1.51* 1.20*  CALCIUM 7.8*  --      Recent Results (from the past 240 hour(s))  MRSA Next Gen by PCR, Nasal     Status: None   Collection Time: 05/01/22  4:11 AM   Specimen: Nasal Mucosa; Nasal Swab  Result Value Ref Range Status   MRSA by PCR Next Gen NOT DETECTED NOT DETECTED Final    Comment: (NOTE) The GeneXpert MRSA Assay (FDA approved for NASAL specimens only), is one component of a comprehensive MRSA colonization surveillance  program. It is not intended to diagnose MRSA infection nor to guide or monitor treatment for MRSA infections. Test performance is not FDA approved in patients less than 60 years old. Performed at [facility], [address], [place], [place] [address]      Radiology Studies: CT CHEST ABDOMEN PELVIS WO CONTRAST  Result Date: 04/30/2022 CLINICAL DATA:  Sepsis.  Cough. EXAM: CT CHEST, ABDOMEN AND PELVIS WITHOUT CONTRAST TECHNIQUE: Multidetector CT imaging of the chest, abdomen and pelvis was performed following the standard protocol without IV contrast. RADIATION DOSE REDUCTION: This exam was performed according to the departmental dose-optimization program which includes automated exposure control, adjustment of the mA and/or kV according to patient size and/or use of iterative reconstruction technique. COMPARISON:  Chest CT dated 02/07/2022. FINDINGS: Evaluation of this exam is limited in the absence of intravenous contrast. CT CHEST FINDINGS Cardiovascular: There is no cardiomegaly or pericardial effusion. Advanced 3 vessel coronary vascular calcification. Mild atherosclerotic calcification of the thoracic aorta. No aneurysmal dilatation. The central pulmonary  arteries are grossly unremarkable on this noncontrast CT. Mediastinum/Nodes: No hilar or mediastinal adenopathy. The esophagus is grossly unremarkable. There is a 1.8 cm rim calcified right thyroid nodule. No mediastinal fluid collection. Lungs/Pleura: Trace bilateral pleural effusions. There is minimal associated compressive atelectasis of the left lower lobe. Pneumonia is not excluded. There is no consolidative changes or pneumothorax. The central airways are patent. Musculoskeletal: Osteopenia with degenerative changes of spine. No acute osseous pathology. CT ABDOMEN PELVIS FINDINGS No intra-abdominal free air.  Small ascites. Hepatobiliary: Irregular liver contour consistent with cirrhosis. No biliary dilatation. Cholecystectomy. Pancreas: The pancreas is unremarkable. Spleen: Normal in size without focal abnormality. Adrenals/Urinary Tract: The adrenal glands are unremarkable. Vascular calcification versus a 3 mm nonobstructing right renal upper pole calculus. No hydronephrosis. The left kidney is unremarkable. The urinary bladder is grossly unremarkable. Stomach/Bowel: There is postsurgical changes of the bowel with ileocolic anastomosis in the right hemiabdomen. No bowel obstruction. Vascular/Lymphatic: Moderate aortoiliac atherosclerotic disease. The IVC is unremarkable. No portal venous gas. No adenopathy. A tangle of dilated vessels in the abdomen consistent with varices and indicative of portal hypertension. Reproductive: The uterus is anteverted. Multiple calcified fibroids measure up to 6 cm. Other: There is diffuse subcutaneous edema. There is a 2 cm peritoneal defect in the left anterior pelvic wall with herniation of small amount of ascitic fluid. Musculoskeletal: Osteopenia with degenerative changes of the spine. No acute osseous pathology. IMPRESSION: 1. Trace bilateral pleural effusions with minimal associated compressive atelectasis of the left lower lobe. Pneumonia is not excluded. 2. Cirrhosis  with evidence of portal hypertension and small ascites. 3. Postsurgical changes of the bowel with ileocolic anastomosis in the right hemiabdomen. No bowel obstruction. 4. Diffuse subcutaneous edema and anasarca. 5.  Aortic Atherosclerosis (ICD10-I70.0). Electronically Signed   By: Elgie Collard M.D.   On: 04/30/2022 21:52   DG Chest Port 1 View  Result Date: 04/30/2022 CLINICAL DATA:  Sepsis EXAM: PORTABLE CHEST 1 VIEW COMPARISON:  X-ray 05/13/2021.  CT scan 02/07/2022 FINDINGS: Underinflation with enlarged cardiopericardial silhouette. Left midlung scar or atelectasis. No consolidation, pneumothorax. Decreasing left effusion. Films are under penetrated. Calcified aorta. Overlapping cardiac leads IMPRESSION: Underinflation. Left midlung scar or atelectasis. Improving left pleural effusion Electronically Signed   By: Karen Kays M.D.   On: 04/30/2022 20:39    Scheduled Meds:  Chlorhexidine Gluconate Cloth  6 each Topical Daily   enoxaparin (LOVENOX) injection  0.5 mg/kg Subcutaneous Q24H   levothyroxine  150 mcg Oral Q0600   neomycin-polymyxin b-dexamethasone   Left Eye QID   nystatin ointment   Topical BID   rifaximin  550 mg Oral BID   venlafaxine XR  75 mg Oral Daily   Continuous Infusions:  ceFEPime (MAXIPIME) IV     lactated ringers 75 mL/hr at 05/01/22 0646   [START ON 05/02/2022] vancomycin       LOS: 1 day   Burnadette Pop, MD [facility] P4/21/2024, 7:40 AM

## 2022-05-02 NOTE — Progress Notes (Signed)
Pharmacy Antibiotic Note  [name] is a 67 y.o. female admitted on 04/30/2022.  Pharmacy has been consulted for Cefepime and Vancomycin dosing for cellulitis and bacteremia.  Plan: Cefepime 2g IV q12h Vancomycin 1000 mg IV q24h (SCr 1.26, Vd 0.5, est AUC 490) Measure Vanc levels as needed.  Goal AUC = 400 - 550  Follow up renal function, culture results, and clinical course.   Height:  (157.5 cm) Weight: 119.8 kg (264 lb 1.8 oz) IBW/kg (Calculated) : 50.1  Temp (24hrs), Avg:98.1 F (36.7 C), Min:97.5 F (36.4 C), Max:98.5 F (36.9 C)  Recent Labs  Lab 04/30/22 2019 04/30/22 2034 04/30/22 2244 05/01/22 0242 05/01/22 0620 05/02/22 0318 05/02/22 0855  WBC 28.0*  --   --   --  25.4*  --  25.1*  CREATININE 1.51* 1.20*  --   --   --  1.26*  --   LATICACIDVEN 5.6*  --  5.0* 4.0* 3.5* 2.9*  --     Estimated Creatinine Clearance: 55.5 mL/min (A) (by C-G formula based on SCr of 1.26 mg/dL (H)).    Allergies  Allergen Reactions   Ivp Dye [Iodinated Contrast Media] Anaphylaxis   Ace Inhibitors Swelling   Chlorhexidine     Pt has a rash, & made irritation skin further   Doxycycline Rash   Penicillins Rash    Did it involve swelling of the face/tongue/throat, SOB, or low BP? No Did it involve sudden or severe rash/hives, skin peeling, or any reaction on the inside of your mouth or nose? Yes Did you need to seek medical attention at a hospital or doctor's office? No When did it last happen? Several Years Ago      If all above answers are "NO", may proceed with cephalosporin use.     Zofran [Ondansetron] Rash   Antimicrobials this admission: 4/20 vancomycin >> 4/21, resumed 4/22 >>  4/20 cefepime >> 4/21, resumed 4/22 >>  4/21 fluconazole >> 4/21 CTX >> 4/22 4/21 Azith >>    Dose adjustments this admission:     Microbiology results: 4/20 BCx: 1/3 bottles (aerobic) Gram negative Rods (BCID with no results) 4/20 UA: few bacteria, 0-5 Epith, 0-5 WBC 4/20 UCx:  >100k K.pneumoniae 4/21 MRSA PCR: not detected 4/22 UA: no bacteria, 0-5 Epith, 0-5 WBC   Thank you for allowing pharmacy to be a part of this patient's care.  Lynann Beaver PharmD, BCPS [facility] main pharmacy [phone] 05/02/2022 1:17 PM

## 2022-05-02 NOTE — Progress Notes (Signed)
  Transition of Care (TOC) Screening Note   Patient Details  Name: [name] Date of Birth: [birth year]   Transition of Care [facility]) CM/SW Contact:    Lavenia Atlas, RN Phone Number: 05/02/2022, 3:14 PM    Transition of Care Department [facility]) has reviewed patient and no TOC needs have been identified at this time. We will continue to monitor patient advancement through interdisciplinary progression rounds. If new patient transition needs arise, please place a TOC consult.

## 2022-05-03 DIAGNOSIS — A419 Sepsis, unspecified organism: Secondary | ICD-10-CM | POA: Diagnosis not present

## 2022-05-03 LAB — TYPE AND SCREEN: ABO/RH(D): AB NEG

## 2022-05-03 LAB — BASIC METABOLIC PANEL
Anion gap: 9 (ref 5–15)
BUN: 25 mg/dL — ABNORMAL HIGH (ref 8–23)
CO2: 15 mmol/L — ABNORMAL LOW (ref 22–32)
Calcium: 7.3 mg/dL — ABNORMAL LOW (ref 8.9–10.3)
Chloride: 108 mmol/L (ref 98–111)
Creatinine, Ser: 1.27 mg/dL — ABNORMAL HIGH (ref 0.44–1.00)
GFR, Estimated: 47 mL/min — ABNORMAL LOW (ref >60)
Glucose, Bld: 147 mg/dL — ABNORMAL HIGH (ref 70–99)
Potassium: 4 mmol/L (ref 3.5–5.1)
Sodium: 132 mmol/L — ABNORMAL LOW (ref 135–145)

## 2022-05-03 LAB — LACTIC ACID, PLASMA: Lactic Acid, Venous: 2.4 mmol/L (ref 0.5–1.9)

## 2022-05-03 LAB — URINE CULTURE

## 2022-05-03 LAB — OCCULT BLOOD X 1 CARD TO LAB, STOOL: Fecal Occult Bld: POSITIVE — AB

## 2022-05-03 LAB — BPAM RBC: Unit Type and Rh: 600

## 2022-05-03 LAB — CBC
HCT: 22 % — ABNORMAL LOW (ref 36.0–46.0)
Hemoglobin: 6.8 g/dL — CL (ref 12.0–15.0)
MCH: 28.3 pg (ref 26.0–34.0)
MCHC: 30.9 g/dL (ref 30.0–36.0)
MCV: 91.7 fL (ref 80.0–100.0)
Platelets: 134 10*3/uL — ABNORMAL LOW (ref 150–400)
RBC: 2.4 MIL/uL — ABNORMAL LOW (ref 3.87–5.11)
RDW: 20.5 % — ABNORMAL HIGH (ref 11.5–15.5)
WBC: 11.8 10*3/uL — ABNORMAL HIGH (ref 4.0–10.5)
nRBC: 0 % (ref 0.0–0.2)

## 2022-05-03 LAB — AMMONIA: Ammonia: 73 umol/L — ABNORMAL HIGH (ref 9–35)

## 2022-05-03 LAB — CULTURE, BLOOD (ROUTINE X 2)

## 2022-05-03 LAB — PREPARE RBC (CROSSMATCH)

## 2022-05-03 LAB — ABO/RH: ABO/RH(D): AB NEG

## 2022-05-03 MED ORDER — SPIRONOLACTONE 25 MG PO TABS
25.0000 mg | ORAL_TABLET | Freq: Every day | ORAL | Status: DC
  Administered 2022-05-03 – 2022-05-10 (×8): 25 mg via ORAL
  Filled 2022-05-03 (×9): qty 1

## 2022-05-03 MED ORDER — FUROSEMIDE 10 MG/ML IJ SOLN
20.0000 mg | Freq: Two times a day (BID) | INTRAMUSCULAR | Status: DC
  Administered 2022-05-03 – 2022-05-10 (×14): 20 mg via INTRAVENOUS
  Filled 2022-05-03 (×14): qty 2

## 2022-05-03 MED ORDER — PREDNISONE 20 MG PO TABS: 20.0000 mg | ORAL_TABLET | Freq: Every day | ORAL | Status: DC

## 2022-05-03 MED ORDER — MIDODRINE HCL 5 MG PO TABS
10.0000 mg | ORAL_TABLET | Freq: Three times a day (TID) | ORAL | Status: DC
  Administered 2022-05-03 – 2022-05-10 (×20): 10 mg via ORAL
  Filled 2022-05-03 (×21): qty 2

## 2022-05-03 MED ORDER — PREDNISONE 20 MG PO TABS: 40.0000 mg | ORAL_TABLET | Freq: Every day | ORAL | Status: DC

## 2022-05-03 MED ORDER — LACTULOSE 10 GM/15ML PO SOLN
20.0000 g | Freq: Three times a day (TID) | ORAL | Status: DC
  Administered 2022-05-03 – 2022-05-04 (×3): 20 g via ORAL
  Filled 2022-05-03 (×3): qty 30

## 2022-05-03 MED ORDER — FUROSEMIDE 10 MG/ML IJ SOLN
40.0000 mg | Freq: Two times a day (BID) | INTRAMUSCULAR | Status: DC
  Administered 2022-05-03: 40 mg via INTRAVENOUS
  Filled 2022-05-03: qty 4

## 2022-05-03 MED ORDER — PANTOPRAZOLE SODIUM 40 MG IV SOLR
40.0000 mg | Freq: Two times a day (BID) | INTRAVENOUS | Status: DC
  Administered 2022-05-03 – 2022-05-05 (×4): 40 mg via INTRAVENOUS
  Filled 2022-05-03 (×4): qty 10

## 2022-05-03 MED ORDER — SODIUM CHLORIDE 0.9% IV SOLUTION: Freq: Once | INTRAVENOUS | Status: AC

## 2022-05-03 NOTE — Progress Notes (Addendum)
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  Became febrile up to 101 F at home, little confused to her son.Has been dealing with yeast infection beneath left breast and all around her groin/vulvarvaginal region. Just started doing nystatin powder.  On presentation ,she was hypotensive, tachycardic.  Lab work showed WC count of 28,000, elevated lactic acid level up to the range of 5.  Chest x-ray did not show any pneumonia.  CT chest/abdomen/pelvis showed trace bilateral pleural effusion, atelectasis of left lower lobe but pneumonia not excluded.  Found to have edema/erythema around left breast, left flank ,vulvovaginal area.  Patient was admitted for the management of septic shock likely from cellulitis.  Started on broad spectrum antibiotics, antifungal .blood culture sent.  Assessment & Plan:  Principal Problem:   Sepsis Active Problems:   Hypotension   Liver cirrhosis secondary to NASH   AKI (acute kidney injury)   Candidal skin infection   History of eye surgery   Obesity, Class III, BMI 40-49.9 (morbid obesity)  Septic shock: Presented with fever, hypotension, tachycardia, lactic acidosis.  Likely from cellulitis  but chest imaging could not rule out left lower lobe infiltrate. Found to have erythema/swelling of left breast, left flank ,groin/vulvovaginal area.  Started on broad-spectrum antibiotics.   Had severe leukocytosis.  Continue vanc, cefepime for now, Follow-up blood cultures showing gram-negative rods but BCID  is negative.Urine culture showing   Klebsiella pneumonia, sensitivity pending .continue  antifungal.Low threshold to discontinue vancomycin soon Also takes midodrine at home as needed, restarted as  10 mg tid. Foley has been placed on admission, will give voiding trial near discharge date  Liver cirrhosis/NASH/anasarca/hypokalemia : Has edema/anasarca. Takes Lasix, spironolactone at home.  Currently on Lasix 40 mg twice daily.  On rifaximin, restarted.  Ammonia level is high, started on lactulose .remains mildly confused today most likely from ammonia level. Follows with Dr Dierdre Searles at [facility]  Recent history of eye surgery:Blind in left eye with recent gunderson flap surgery on 4/19 with [facility] ophthalmology.  Continue antibiotic ophthalmic ointment  Itchy rash: Complains of intense itchy rash on her back.  Noticed to have erythematous rash on the back.  Local ointment/oral Benadryl not helping.  Started on prednisone with tapering  AKI: Creatinine of 1.5 on presentation.  Improving  Normocytic anemia: Hemoglobin in the range of 6 today.  Patient has chronic iron deficiency, iron level very low.  Gets regular iron infusion.  Denies any hematochezia or melena.  Checking FOBT.  Given a dose of iron infusion on 4/22.  Status post monitor blood transfusion on 4/23.  Monitor H&H Also has mild thrombocytopenia: Most likely secondary to history of cirrhosis.  Hyperlipidemia: On Lipitor  History of gout: On colchicine as needed.  Hypothyroidism: On Synthyroid  History of depression:on  Venlafaxine.  Morbid obesity: BMI of 48.3  Deconditioning/debility: Ambulates with the help of walker.  Will consult PT/OT  when appropriate       DVT prophylaxis:Lovenox     Code Status: Full Code  Family Communication: Called and discussed with son on phone on 4/23  Patient status:Inpatient  Patient is from :Home  Anticipated discharge YN:WGNF  Estimated DC date:2-3  days   Consultants: None  Procedures:None  Antimicrobials:  Anti-infectives (From admission, onward)     Start     Dose/Rate Route Frequency Ordered Stop   05/02/22 1400  vancomycin (VANCOCIN) IVPB 1000 mg/200 mL premix        1,000 mg 200 mL/hr over 60 Minutes Intravenous Every 24 hours 05/02/22 1309     05/02/22 1230  ceFEPIme (MAXIPIME) 2 g in sodium chloride 0.9 % 100 mL IVPB        2 g 200 mL/hr over 30 Minutes Intravenous Every 12 hours 05/02/22 1141     05/02/22 1000  vancomycin (VANCOREADY) IVPB 1500 mg/300 mL  Status:  Discontinued        1,500 mg 150 mL/hr over 120 Minutes Intravenous Every 36 hours 05/01/22 0055 05/01/22 1325   05/02/22 1000  fluconazole (DIFLUCAN) tablet 150 mg        150 mg Oral Daily 05/01/22 1125 05/08/22 0959   05/02/22 1000  cefTRIAXone (ROCEPHIN) 2 g in sodium chloride 0.9 % 100 mL IVPB  Status:  Discontinued        2 g 200 mL/hr over 30 Minutes Intravenous Every 24 hours 05/01/22 1325 05/02/22 1051   05/01/22 1415  azithromycin (ZITHROMAX) tablet 500 mg        500 mg Oral Daily 05/01/22 1325 05/06/22 0959   05/01/22 1000  ceFEPIme (MAXIPIME) 2 g in sodium chloride 0.9 % 100 mL IVPB  Status:  Discontinued        2 g 200 mL/hr over 30 Minutes Intravenous Every 12 hours 05/01/22 0055 05/01/22 1325   05/01/22 0030  fluconazole (DIFLUCAN) tablet 150 mg        150 mg Oral  Once 05/01/22 0027 05/01/22 0249   05/01/22 0000  rifaximin (XIFAXAN) tablet 550 mg        550 mg Oral 2 times daily 04/30/22 2346     04/30/22 2115  vancomycin (VANCOREADY) IVPB 2000 mg/400 mL        2,000 mg 200 mL/hr over 120 Minutes Intravenous  Once 04/30/22 2100 05/01/22 0053   04/30/22 2100  ceFEPIme (MAXIPIME) 2 g in sodium chloride 0.9 % 100 mL IVPB        2 g 200 mL/hr over 30 Minutes Intravenous  Once 04/30/22 2057 04/30/22 2310       Subjective: Patient seen and examined at bedside today.  Blood pressure is soft but stable.  She is mildly confused today but could tell me the day and month.  Itchy rash on the back is better.  She still has significant anasarca and pain on the  left flank  Objective: Vitals:   05/03/22 0901 05/03/22 0915 05/03/22 0916 05/03/22 1000  BP: 102/78  (!) 101/53 103/75  Pulse: 84 87 79 70  Resp: (!) 22 14 (!) 23 16  Temp:   98 F (36.7 C)   TempSrc:   Oral   SpO2: 95% 96% 99% 100%  Weight:      Height:        Intake/Output Summary (Last 24 hours) at 05/03/2022 1058 Last data filed at 05/03/2022 0216 Gross per 24 hour  Intake 301.15 ml  Output 375 ml  Net -73.85 ml   Filed Weights   04/30/22 2038 05/01/22 0351  Weight: 112.5 kg 119.8 kg    Examination:  General exam: Lying in bed, deconditioned, weak, morbidly obese HEENT: Blind on left eye, erythematous conjunctival Respiratory system:  no wheezes or crackles, diminished sounds bilaterally  Cardiovascular system: S1 & S2 heard, RRR.  Gastrointestinal system: Abdomen is nondistended, soft and nontender.  Ventral hernia Central nervous system: Alert and oriented but sleepy Extremities: Anasarca, Skin: Erythematous rash on the back, scattered purpura on the bilateral lower extremities and trunk, edematous/erythematous skin on left breast, left flank   Data Reviewed: I have personally reviewed following labs and imaging studies  CBC: Recent Labs  Lab 04/30/22 2019 04/30/22 2034 05/01/22 0620 05/02/22 0855 05/03/22 0338  WBC 28.0*  --  25.4* 25.1* 11.8*  NEUTROABS 25.9*  --   --   --   --   HGB 8.9* 10.2* 8.5* 7.3* 6.8*  HCT 28.4* 30.0* 26.9* 23.7* 22.0*  MCV 92.8  --  91.2 93.3 91.7  PLT 195  --  116* 155 134*   Basic Metabolic Panel: Recent Labs  Lab 04/30/22 2019 04/30/22 2034 05/02/22 0318 05/03/22 0338  NA 136 137 133* 132*  K 3.6 3.7 4.3 4.0  CL 110 109 108 108  CO2 11*  --  13* 15*  GLUCOSE 94 97 88 147*  BUN 25*  CREATININE 1.51* 1.20* 1.26* 1.27*  CALCIUM 7.8*  --  7.3* 7.3*     Recent Results (from the past 240 hour(s))  Blood Culture (routine x 2)     Status: None (Preliminary result)   Collection Time: 04/30/22  8:19 PM    Specimen: Left Antecubital; Blood  Result Value Ref Range Status   Specimen Description   Final    LEFT ANTECUBITAL BOTTLES DRAWN AEROBIC AND ANAEROBIC Performed at [facility], [address], [place], [place] [address]    Special Requests   Final    Blood Culture adequate volume Performed at [facility], [address], [place], [place] [address]    Culture  Setup Time   Final    GRAM NEGATIVE RODS AEROBIC BOTTLE ONLY CRITICAL RESULT CALLED TO, READ BACK BY AND VERIFIED WITH: [employer] GLOGOVAC [id number] FCP Performed at [facility], [address], [place], [place] [address]    Culture GRAM NEGATIVE RODS  Final   Report Status PENDING  Incomplete  Blood Culture ID Panel (Reflexed)     Status: None   Collection Time: 04/30/22  8:19 PM  Result Value Ref Range Status   Enterococcus faecalis NOT DETECTED NOT DETECTED Final   Enterococcus Faecium NOT DETECTED NOT DETECTED Final   Listeria monocytogenes NOT DETECTED NOT DETECTED Final   Staphylococcus species NOT DETECTED NOT DETECTED Final   Staphylococcus aureus (BCID) NOT DETECTED NOT DETECTED Final   Staphylococcus epidermidis NOT DETECTED NOT DETECTED Final   Staphylococcus lugdunensis NOT DETECTED NOT DETECTED Final   Streptococcus species NOT DETECTED NOT DETECTED Final   Streptococcus agalactiae NOT DETECTED NOT DETECTED Final   Streptococcus pneumoniae NOT DETECTED NOT DETECTED Final   Streptococcus pyogenes NOT DETECTED NOT DETECTED Final   A.calcoaceticus-baumannii NOT DETECTED NOT DETECTED Final   Bacteroides fragilis NOT DETECTED NOT DETECTED Final   Enterobacterales NOT DETECTED NOT DETECTED Final   Enterobacter cloacae complex NOT DETECTED NOT DETECTED Final   Escherichia coli NOT DETECTED NOT DETECTED Final   Klebsiella aerogenes NOT DETECTED NOT DETECTED Final   Klebsiella oxytoca NOT DETECTED NOT DETECTED Final   Klebsiella pneumoniae NOT DETECTED NOT DETECTED  Final   Proteus species NOT DETECTED NOT DETECTED Final   Salmonella species NOT DETECTED NOT DETECTED Final   Serratia marcescens NOT DETECTED NOT DETECTED Final  Haemophilus influenzae NOT DETECTED NOT DETECTED Final   Neisseria meningitidis NOT DETECTED NOT DETECTED Final   Pseudomonas aeruginosa NOT DETECTED NOT DETECTED Final   Stenotrophomonas maltophilia NOT DETECTED NOT DETECTED Final   Candida albicans NOT DETECTED NOT DETECTED Final   Candida auris NOT DETECTED NOT DETECTED Final   Candida glabrata NOT DETECTED NOT DETECTED Final   Candida krusei NOT DETECTED NOT DETECTED Final   Candida parapsilosis NOT DETECTED NOT DETECTED Final   Candida tropicalis NOT DETECTED NOT DETECTED Final   Cryptococcus neoformans/gattii NOT DETECTED NOT DETECTED Final    Comment: Performed at [facility], [address], [place], [place] [address]  Urine Culture (for pregnant, neutropenic or urologic patients or patients with an indwelling urinary catheter)     Status: Abnormal (Preliminary result)   Collection Time: 04/30/22 10:32 PM   Specimen: Urine, Clean Catch  Result Value Ref Range Status   Specimen Description   Final    URINE, CLEAN CATCH Performed at [facility], [address], [place], [place] [address]    Special Requests   Final    NONE Performed at [facility], [address] [place], [place] [address]    Culture (A)  Final    >=100,000 COLONIES/mL KLEBSIELLA PNEUMONIAE SUSCEPTIBILITIES TO FOLLOW Performed at [name], [address] [place], [place] [address]    Report Status PENDING  Incomplete  MRSA Next Gen by PCR, Nasal     Status: None   Collection Time: 05/01/22  4:11 AM   Specimen: Nasal Mucosa; Nasal Swab  Result Value Ref Range Status   MRSA by PCR Next Gen NOT DETECTED NOT DETECTED Final    Comment: (NOTE) The GeneXpert MRSA Assay (FDA approved for NASAL specimens only), is one component of a  comprehensive MRSA colonization surveillance program. It is not intended to diagnose MRSA infection nor to guide or monitor treatment for MRSA infections. Test performance is not FDA approved in patients less than 41 years old. Performed at [facility], [address] [place], [place] [address]   Blood Culture (routine x 2)     Status: None (Preliminary result)   Collection Time: 05/01/22 11:09 AM   Specimen: BLOOD LEFT ARM  Result Value Ref Range Status   Specimen Description   Final    BLOOD LEFT ARM Performed at [facility], [address] [place], [place] [address]    Special Requests   Final    AEROBIC BOTTLE ONLY Blood Culture adequate volume Performed at [facility], [address], [place], [place] [address]    Culture   Final    NO GROWTH 2 DAYS Performed at [facility], [address], [place], [place] [address]    Report Status PENDING  Incomplete     Radiology Studies: No results found.  Scheduled Meds:  azithromycin  500 mg Oral Daily   enoxaparin (LOVENOX) injection  0.5 mg/kg Subcutaneous Q24H   famotidine  20 mg Oral Daily   fluconazole  150 mg Oral Daily   furosemide  40 mg Intravenous Q12H   lactulose  20 g Oral TID   levothyroxine  150 mcg Oral Q0600   midodrine  10 mg Oral TID WC   neomycin-polymyxin b-dexamethasone   Left Eye QID   nystatin ointment   Topical BID   [START ON 05/05/2022] predniSONE  20 mg Oral Q breakfast   [START ON 05/04/2022] predniSONE  40 mg Oral Q breakfast   rifaximin  550 mg Oral BID   venlafaxine XR  75 mg Oral Daily   Continuous Infusions:  ceFEPime (MAXIPIME) IV Stopped (05/03/22 0216)   vancomycin Stopped (05/02/22 1336)     LOS: 3 days   Kara Pop, MD [facility] P4/23/2024, 10:58 AM

## 2022-05-04 DIAGNOSIS — A419 Sepsis, unspecified organism: Secondary | ICD-10-CM | POA: Diagnosis not present

## 2022-05-04 LAB — COMPREHENSIVE METABOLIC PANEL
ALT: 27 U/L (ref 0–44)
AST: 29 U/L (ref 15–41)
Albumin: 1.9 g/dL — ABNORMAL LOW (ref 3.5–5.0)
Alkaline Phosphatase: 73 U/L (ref 38–126)
Anion gap: 7 (ref 5–15)
BUN: 28 mg/dL — ABNORMAL HIGH (ref 8–23)
CO2: 18 mmol/L — ABNORMAL LOW (ref 22–32)
Calcium: 7.9 mg/dL — ABNORMAL LOW (ref 8.9–10.3)
Chloride: 111 mmol/L (ref 98–111)
Creatinine, Ser: 1.14 mg/dL — ABNORMAL HIGH (ref 0.44–1.00)
GFR, Estimated: 54 mL/min — ABNORMAL LOW (ref >60)
Glucose, Bld: 100 mg/dL — ABNORMAL HIGH (ref 70–99)
Potassium: 3.5 mmol/L (ref 3.5–5.1)
Sodium: 136 mmol/L (ref 135–145)
Total Bilirubin: 1.6 mg/dL — ABNORMAL HIGH (ref 0.3–1.2)
Total Protein: 6.1 g/dL — ABNORMAL LOW (ref 6.5–8.1)

## 2022-05-04 LAB — CBC
HCT: 26 % — ABNORMAL LOW (ref 36.0–46.0)
Hemoglobin: 8.5 g/dL — ABNORMAL LOW (ref 12.0–15.0)
MCH: 29.3 pg (ref 26.0–34.0)
MCHC: 32.7 g/dL (ref 30.0–36.0)
MCV: 89.7 fL (ref 80.0–100.0)
Platelets: 138 10*3/uL — ABNORMAL LOW (ref 150–400)
RBC: 2.9 MIL/uL — ABNORMAL LOW (ref 3.87–5.11)
RDW: 19.4 % — ABNORMAL HIGH (ref 11.5–15.5)
WBC: 12.7 10*3/uL — ABNORMAL HIGH (ref 4.0–10.5)
nRBC: 0.2 % (ref 0.0–0.2)

## 2022-05-04 LAB — BPAM RBC
Blood Product Expiration Date: 202405202359
ISSUE DATE / TIME: 202404230854

## 2022-05-04 LAB — TYPE AND SCREEN
Antibody Screen: NEGATIVE
Unit division: 0

## 2022-05-04 LAB — URINE CULTURE: Culture: >=100000 — AB

## 2022-05-04 LAB — AMMONIA: Ammonia: 29 umol/L (ref 9–35)

## 2022-05-04 MED ORDER — HYDROXYZINE HCL 25 MG PO TABS
25.0000 mg | ORAL_TABLET | Freq: Three times a day (TID) | ORAL | Status: DC | PRN
  Administered 2022-05-04 – 2022-05-09 (×10): 25 mg via ORAL
  Filled 2022-05-04 (×10): qty 1

## 2022-05-04 MED ORDER — KETOROLAC TROMETHAMINE 0.5 % OP SOLN
1.0000 [drp] | Freq: Four times a day (QID) | OPHTHALMIC | Status: DC
  Administered 2022-05-04 – 2022-05-10 (×25): 1 [drp] via OPHTHALMIC
  Filled 2022-05-04: qty 5

## 2022-05-04 MED ORDER — SODIUM CHLORIDE 0.9 % IV SOLN
2.0000 g | INTRAVENOUS | Status: DC
  Administered 2022-05-04 – 2022-05-05 (×2): 2 g via INTRAVENOUS
  Filled 2022-05-04 (×2): qty 20

## 2022-05-04 MED ORDER — SODIUM CHLORIDE 0.9 % IV SOLN: 2.0000 g | Freq: Three times a day (TID) | INTRAVENOUS | Status: DC

## 2022-05-04 MED ORDER — LACTULOSE 10 GM/15ML PO SOLN
30.0000 g | Freq: Three times a day (TID) | ORAL | Status: DC
  Administered 2022-05-04 (×2): 30 g via ORAL
  Filled 2022-05-04 (×3): qty 60

## 2022-05-04 NOTE — Progress Notes (Signed)
Patient is alert with confusion, thrashed about in bed most of night, consoled, redirected and reassured to no avail. Needs are anticipated and met. Safety maintained. Safety sitter in place. Safety mitts applied, patient removed with teeth. IV access to right forearm secured with stockinette. Will continue to observe.

## 2022-05-04 NOTE — Progress Notes (Signed)
Pharmacy Antibiotic Note  [name] is a 67 y.o. female admitted on 04/30/2022.  Pharmacy has been consulted for Cefepime and Vancomycin dosing for cellulitis and bacteremia.  Today, serum creatinine has improved to 1.14 and estimated CrCl now at 61 ml/min.    Plan: Change Cefepime to 2g IV q8h Continue Vancomycin 1000 mg IV q24h (SCr 1.14, Vd 0.5, est AUC 449.7) Measure Vanc levels as needed.  Goal AUC = 400 - 550  Follow up renal function, culture results, and clinical course.   Height:  (157.5 cm) Weight: 119.8 kg (264 lb 1.8 oz) IBW/kg (Calculated) : 50.1  Temp (24hrs), Avg:98.2 F (36.8 C), Min:97.6 F (36.4 C), Max:98.6 F (37 C)  Recent Labs  Lab 04/30/22 2019 04/30/22 2034 04/30/22 2244 05/01/22 0242 05/01/22 0620 05/02/22 0318 05/02/22 0855 05/03/22 0338 05/04/22 0427  WBC 28.0*  --   --   --  25.4*  --  25.1* 11.8* 12.7*  CREATININE 1.51* 1.20*  --   --   --  1.26*  --  1.27* 1.14*  LATICACIDVEN 5.6*  --  5.0* 4.0* 3.5* 2.9*  --  2.4*  --      Estimated Creatinine Clearance: 61.4 mL/min (A) (by C-G formula based on SCr of 1.14 mg/dL (H)).    Allergies  Allergen Reactions   Ivp Dye [Iodinated Contrast Media] Anaphylaxis   Ace Inhibitors Swelling   Chlorhexidine     Pt has a rash, & made irritation skin further   Doxycycline Rash   Penicillins Rash    Did it involve swelling of the face/tongue/throat, SOB, or low BP? No Did it involve sudden or severe rash/hives, skin peeling, or any reaction on the inside of your mouth or nose? Yes Did you need to seek medical attention at a hospital or doctor's office? No When did it last happen? Several Years Ago      If all above answers are "NO", may proceed with cephalosporin use.     Zofran [Ondansetron] Rash   Antimicrobials this admission: 4/20 vancomycin >> 4/21, resumed 4/22 >>  4/20 cefepime >> 4/21, resumed 4/22 >>  4/21 fluconazole >> 4/21 CTX >> 4/22 4/21 Azith >>    Dose adjustments this  admission: 4/24 Change cefepime from q12h to q8h    Microbiology results: 4/20 BCx: 1/3 bottles (aerobic) Gram negative Rods (BCID with no results) - flavobacterium odoratum, awaiting susceptibilities 4/20 UA: few bacteria, 0-5 Epith, 0-5 WBC 4/20 UCx: >100k K.pneumoniae 4/21 MRSA PCR: not detected 4/22 UA: no bacteria, 0-5 Epith, 0-5 WBC   Thank you for allowing pharmacy to be a part of this patient's care.  Selinda Eon, PharmD, BCPS Clinical Pharmacist [place] Please utilize Amion for appropriate phone number to reach the unit pharmacist [facility]) 05/04/2022 8:30 AM

## 2022-05-04 NOTE — Progress Notes (Signed)
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  Became febrile up to 101 F at home, little confused to her son.Has been dealing with yeast infection beneath left breast and all around her groin/vulvarvaginal region. Just started doing nystatin powder.  On presentation ,she was hypotensive, tachycardic.  Lab work showed WC count of 28,000, elevated lactic acid level up to the range of 5.  Chest x-ray did not show any pneumonia.  CT chest/abdomen/pelvis showed trace bilateral pleural effusion, atelectasis of left lower lobe but pneumonia not excluded.  Found to have edema/erythema around left breast, left flank ,vulvovaginal area.  Patient was admitted for the management of septic shock likely from cellulitis.  Started on broad spectrum antibiotics, antifungal .  Hospital course remarkable for  hepatic encephalopathy, anemia with positive fecal blood.  GI consulted  Assessment & Plan:  Principal Problem:   Sepsis Active Problems:   Hypotension   Liver cirrhosis secondary to NASH   AKI (acute kidney injury)   Candidal skin infection   History of eye surgery   Obesity, Class III, BMI 40-49.9 (morbid obesity)  Septic shock: Presented with fever, hypotension, tachycardia, lactic acidosis.  Likely from cellulitis  but chest imaging could not rule out left lower lobe infiltrate. Found to have erythema/swelling of left breast, left flank ,groin/vulvovaginal area.  Started on broad-spectrum antibiotics.   Had severe leukocytosis.  , Follow-up blood cultures showing gram-negative rods but BCID  is negative.Urine culture showing   Klebsiella  pneumonia.  Continue broad-spectrum antibiotics for now.continue antifungal.Low threshold to discontinue vancomycin soon.  One of the blood culture sets showed Flavobacteruim odoratum , likely contamination.Will continue to follow. Also takes midodrine at home as needed, restarted as  10 mg tid. Foley has been placed on admission, will give voiding trial near discharge date  Liver cirrhosis/NASH/anasarca/hypokalemia : Has edema/anasarca. Takes Lasix, spironolactone at home.  Currently on Lasix 20 mg IV twice daily.  On rifaximin, restarted.  Ammonia level is high, started on lactulose .remains mildly confused today .Follows with Dr Dierdre Searles at [facility]  Normocytic anemia: Hemoglobin dropped to 6 on 4/23.  Patient has chronic iron deficiency, iron level very low.  Gets regular iron infusion.  Denies any hematochezia or melena.  Positive  FOBT.  Given a dose of iron infusion on 4/22.  GI consulted: Recommended to monitor and continue Protonix.  No plan for intervention for now Also has mild thrombocytopenia: Most likely secondary to history of cirrhosis.  Recent history of eye surgery:Blind in left eye with recent gunderson flap surgery on 4/19 with [facility] ophthalmology.  Continue antibiotic ophthalmic ointment, ketorolac eye drop  Itchy rash: Complained of intense itchy rash on her back.  Noticed to have erythematous rash on the back.  Local ointment/oral Benadryl not helping.  Started on prednisone with improvement .  But patient became more confused show prednisone discontinued.  Ordered Atarax  AKI: Creatinine of 1.5 on presentation.  Improving  Hyperlipidemia: On Lipitor  History of gout: On colchicine as needed.  Hypothyroidism: On Synthyroid  History of depression:on  Venlafaxine.  Morbid obesity: BMI of 48.3  Deconditioning/debility: Ambulates with the help of walker.  Will consult PT/OT  when appropriate       DVT prophylaxis:Lovenox     Code Status: Full Code  Family  Communication: Called and discussed with son on phone on 4/24  Patient status:Inpatient  Patient is from :Home  Anticipated discharge ZO:XWRU  Estimated DC date:2-3 days   Consultants: GI  Procedures:None  Antimicrobials:  Anti-infectives (From admission, onward)    Start     Dose/Rate Route Frequency Ordered Stop   05/04/22 1200  ceFEPIme (MAXIPIME) 2 g in sodium chloride 0.9 % 100 mL IVPB  Status:  Discontinued        2 g 200 mL/hr over 30 Minutes Intravenous Every 8 hours 05/04/22 0823 05/04/22 1001   05/04/22 1100  cefTRIAXone (ROCEPHIN) 2 g in sodium chloride 0.9 % 100 mL IVPB        2 g 200 mL/hr over 30 Minutes Intravenous Every 24 hours 05/04/22 1001     05/02/22 1400  vancomycin (VANCOCIN) IVPB 1000 mg/200 mL premix        1,000 mg 200 mL/hr over 60 Minutes Intravenous Every 24 hours 05/02/22 1309     05/02/22 1230  ceFEPIme (MAXIPIME) 2 g in sodium chloride 0.9 % 100 mL IVPB  Status:  Discontinued        2 g 200 mL/hr over 30 Minutes Intravenous Every 12 hours 05/02/22 1141 05/04/22 0823   05/02/22 1000  vancomycin (VANCOREADY) IVPB 1500 mg/300 mL  Status:  Discontinued        1,500 mg 150 mL/hr over 120 Minutes Intravenous Every 36 hours 05/01/22 0055 05/01/22 1325   05/02/22 1000  fluconazole (DIFLUCAN) tablet 150 mg        150 mg Oral Daily 05/01/22 1125 05/08/22 0959   05/02/22 1000  cefTRIAXone (ROCEPHIN) 2 g in sodium chloride 0.9 % 100 mL IVPB  Status:  Discontinued        2 g 200 mL/hr over 30 Minutes Intravenous Every 24 hours 05/01/22 1325 05/02/22 1051   05/01/22 1415  azithromycin (ZITHROMAX) tablet 500 mg        500 mg Oral Daily 05/01/22 1325 05/06/22 0959   05/01/22 1000  ceFEPIme (MAXIPIME) 2 g in sodium chloride 0.9 % 100 mL IVPB  Status:  Discontinued        2 g 200 mL/hr over 30 Minutes Intravenous Every 12 hours 05/01/22 0055 05/01/22 1325   05/01/22 0030  fluconazole (DIFLUCAN) tablet 150 mg        150 mg Oral  Once 05/01/22 0027 05/01/22  0249   05/01/22 0000  rifaximin (XIFAXAN) tablet 550 mg        550 mg Oral 2 times daily 04/30/22 2346     04/30/22 2115  vancomycin (VANCOREADY) IVPB 2000 mg/400 mL        2,000 mg 200 mL/hr over 120 Minutes Intravenous  Once 04/30/22 2100 05/01/22 0053   04/30/22 2100  ceFEPIme (MAXIPIME) 2 g in sodium chloride 0.9 % 100 mL IVPB        2 g 200 mL/hr over 30 Minutes Intravenous  Once 04/30/22 2057 04/30/22 2310       Subjective: Patient was seen and examined at the bedside today.  She still appears confused today.  Ammonia level was normal this morning.  Sitter at bedside.  She was on mittens and was asking me to remove it.  Still has some itching  Objective: Vitals:   05/03/22 2226 05/04/22 0158 05/04/22 0620 05/04/22 0820  BP: 118/67 103/62 105/66 129/81  Pulse: 74 80 80 87  Resp: Temp: 97.6 F (36.4 C) 98.3 F (36.8 C) 97.9 F (36.6 C) 98.5 F (36.9 C)  TempSrc: Oral Oral Oral Oral  SpO2: 100% 98% 99% 99%  Weight:      Height:        Intake/Output Summary (Last 24 hours) at 05/04/2022 1310 Last data filed at 05/04/2022 1030 Gross per 24 hour  Intake 499.51 ml  Output 3825 ml  Net -3325.49 ml   Filed Weights   04/30/22 2038 05/01/22 0351  Weight: 112.5 kg 119.8 kg    Examination:  General exam: Pleasantly confused, deconditioned, morbidly obese HEENT: Erythematous conjunctiva on the left Respiratory system:  no wheezes or crackles, diminished air sounds bilaterally Cardiovascular system: S1 & S2 heard, RRR.  Gastrointestinal system: Abdomen is nondistended, soft and nontender. Central nervous system: Alert and awake, oriented to place Skin:  Anasarca,Erythematous rash on the back, scattered purpura on the bilateral lower extremities and trunk, edematous/erythematous skin on left breast, left flank    Data Reviewed: I have personally reviewed following labs and imaging studies  CBC: Recent Labs  Lab 04/30/22 2019 04/30/22 2034 05/01/22 0620  05/02/22 0855 05/03/22 0338 05/04/22 0427  WBC 28.0*  --  25.4* 25.1* 11.8* 12.7*  NEUTROABS 25.9*  --   --   --   --   --   HGB 8.9* 10.2* 8.5* 7.3* 6.8* 8.5*  HCT 28.4* 30.0* 26.9* 23.7* 22.0* 26.0*  MCV 92.8  --  91.2 93.3 91.7 89.7  PLT 195  --  116* 155 134* 138*   Basic Metabolic Panel: Recent Labs  Lab 04/30/22 2019 04/30/22 2034 05/02/22 0318 05/03/22 0338 05/04/22 0427  NA 136 137 133* 132* 136  K 3.6 3.7 4.3 4.0 3.5  CL 110 109 108 108 111  CO2 11*  --  13* 15* 18*  GLUCOSE 94 97 88 147* 100*  BUN 25* 28*  CREATININE 1.51* 1.20* 1.26* 1.27* 1.14*  CALCIUM 7.8*  --  7.3* 7.3* 7.9*     Recent Results (from the past 240 hour(s))  Blood Culture (routine x 2)     Status: Abnormal (Preliminary result)   Collection Time: 04/30/22  8:19 PM   Specimen: Left Antecubital; Blood  Result Value Ref Range Status   Specimen Description   Final    LEFT ANTECUBITAL BOTTLES DRAWN AEROBIC AND ANAEROBIC Performed at [facility], [address] [place], [place] [address]    Special Requests   Final    Blood Culture adequate volume Performed at [facility], [address], [place], [place] [address]    Culture  Setup Time   Final    GRAM NEGATIVE RODS AEROBIC BOTTLE ONLY CRITICAL RESULT CALLED TO, READ BACK BY AND VERIFIED WITH: PHARMD N. GLOGOVAC [id number] FCP    Culture (A)  Final    FLAVOBACTERIUM ODORATUM SUSCEPTIBILITIES TO FOLLOW Performed at [facility], [address] [place], [place] [address]    Report Status PENDING  Incomplete  Blood Culture ID Panel (Reflexed)     Status: None   Collection Time: 04/30/22  8:19 PM  Result Value Ref Range Status   Enterococcus faecalis NOT DETECTED NOT DETECTED Final   Enterococcus Faecium NOT DETECTED NOT DETECTED Final   Listeria monocytogenes NOT DETECTED NOT DETECTED Final   Staphylococcus species NOT DETECTED NOT DETECTED Final   Staphylococcus aureus (BCID)  NOT DETECTED NOT  DETECTED Final   Staphylococcus epidermidis NOT DETECTED NOT DETECTED Final   Staphylococcus lugdunensis NOT DETECTED NOT DETECTED Final   Streptococcus species NOT DETECTED NOT DETECTED Final   Streptococcus agalactiae NOT DETECTED NOT DETECTED Final   Streptococcus pneumoniae NOT DETECTED NOT DETECTED Final   Streptococcus pyogenes NOT DETECTED NOT DETECTED Final   A.calcoaceticus-baumannii NOT DETECTED NOT DETECTED Final   Bacteroides fragilis NOT DETECTED NOT DETECTED Final   Enterobacterales NOT DETECTED NOT DETECTED Final   Enterobacter cloacae complex NOT DETECTED NOT DETECTED Final   Escherichia coli NOT DETECTED NOT DETECTED Final   Klebsiella aerogenes NOT DETECTED NOT DETECTED Final   Klebsiella oxytoca NOT DETECTED NOT DETECTED Final   Klebsiella pneumoniae NOT DETECTED NOT DETECTED Final   Proteus species NOT DETECTED NOT DETECTED Final   Salmonella species NOT DETECTED NOT DETECTED Final   Serratia marcescens NOT DETECTED NOT DETECTED Final   Haemophilus influenzae NOT DETECTED NOT DETECTED Final   Neisseria meningitidis NOT DETECTED NOT DETECTED Final   Pseudomonas aeruginosa NOT DETECTED NOT DETECTED Final   Stenotrophomonas maltophilia NOT DETECTED NOT DETECTED Final   Candida albicans NOT DETECTED NOT DETECTED Final   Candida auris NOT DETECTED NOT DETECTED Final   Candida glabrata NOT DETECTED NOT DETECTED Final   Candida krusei NOT DETECTED NOT DETECTED Final   Candida parapsilosis NOT DETECTED NOT DETECTED Final   Candida tropicalis NOT DETECTED NOT DETECTED Final   Cryptococcus neoformans/gattii NOT DETECTED NOT DETECTED Final    Comment: Performed at [facility], [address], [place], [place] [address]  Urine Culture (for pregnant, neutropenic or urologic patients or patients with an indwelling urinary catheter)     Status: Abnormal   Collection Time: 04/30/22 10:32 PM   Specimen: Urine, Clean Catch  Result Value Ref Range Status    Specimen Description   Final    URINE, CLEAN CATCH Performed at [facility], [address] [place], [place] [address]    Special Requests   Final    NONE Performed at [facility], [address] [place], [place] [address]    Culture >=100,000 COLONIES/mL KLEBSIELLA PNEUMONIAE (A)  Final   Report Status 05/04/2022 FINAL  Final   Organism ID, Bacteria KLEBSIELLA PNEUMONIAE (A)  Final      Susceptibility   Klebsiella pneumoniae - MIC*    AMPICILLIN >=32 RESISTANT Resistant     CEFAZOLIN <=4 SENSITIVE Sensitive     CEFEPIME <=0.12 SENSITIVE Sensitive     CEFTRIAXONE <=0.25 SENSITIVE Sensitive     CIPROFLOXACIN <=0.25 SENSITIVE Sensitive     GENTAMICIN <=1 SENSITIVE Sensitive     IMIPENEM <=0.25 SENSITIVE Sensitive     NITROFURANTOIN 64 INTERMEDIATE Intermediate     TRIMETH/SULFA <=20 SENSITIVE Sensitive     AMPICILLIN/SULBACTAM 4 SENSITIVE Sensitive     PIP/TAZO <=4 SENSITIVE Sensitive     * >=100,000 COLONIES/mL KLEBSIELLA PNEUMONIAE  MRSA Next Gen by PCR, Nasal     Status: None   Collection Time: 05/01/22  4:11 AM   Specimen: Nasal Mucosa; Nasal Swab  Result Value Ref Range Status   MRSA by PCR Next Gen NOT DETECTED NOT DETECTED Final    Comment: (NOTE) The GeneXpert MRSA Assay (FDA approved for NASAL specimens only), is one component of a comprehensive MRSA colonization surveillance program. It is not intended to diagnose MRSA infection nor to guide or monitor treatment for MRSA infections. Test performance is not FDA approved in patients less than 2 years  old. Performed at [facility], [address] [place], [place] [address]   Blood Culture (routine x 2)     Status: None (Preliminary result)   Collection Time: 05/01/22 11:09 AM   Specimen: BLOOD LEFT ARM  Result Value Ref Range Status   Specimen Description   Final    BLOOD LEFT ARM Performed at [facility], [address] [place], [place]  [address]    Special Requests   Final    AEROBIC BOTTLE ONLY Blood Culture adequate volume Performed at [facility], [address], [place], [place] [address]    Culture   Final    NO GROWTH 3 DAYS Performed at [facility], [address] [place], [place] [address]    Report Status PENDING  Incomplete     Radiology Studies: No results found.  Scheduled Meds:  azithromycin  500 mg Oral Daily   enoxaparin (LOVENOX) injection  0.5 mg/kg Subcutaneous Q24H   fluconazole  150 mg Oral Daily   furosemide  20 mg Intravenous Q12H   ketorolac  1 drop Left Eye QID   lactulose  30 g Oral TID   levothyroxine  150 mcg Oral Q0600   midodrine  10 mg Oral TID WC   neomycin-polymyxin b-dexamethasone   Left Eye QID   nystatin ointment   Topical BID   pantoprazole (PROTONIX) IV  40 mg Intravenous Q12H   rifaximin  550 mg Oral BID   spironolactone  25 mg Oral Daily   venlafaxine XR  75 mg Oral Daily   Continuous Infusions:  cefTRIAXone (ROCEPHIN)  IV 2 g (05/04/22 1143)   vancomycin Stopped (05/03/22 1517)     LOS: 4 days   Burnadette Pop, MD [facility] P4/24/2024, 1:10 PM

## 2022-05-04 NOTE — Consult Note (Signed)
[facility] Consult  Referring Provider: No ref. provider found Primary Care Physician:  [name], MD Primary Gastroenterologist: [facility]  Reason for Consultation:  Anemia  HPI: [name] is a 67 y.o. female with history of NASH related cirrhosis, hepatic encephalopathy, anasarca/edema, follows up with Dr. Nedra Hai at [facility], admittedOn 04/30/2022 with fever, confusion, septic shock, lactic acidosis from cellulitis.  Patient has history of iron deficiency anemia and hemoglobin dropped from 7.3-6.8 without obvious melena or hematochezia, GI was consulted for further evaluation.  As per nursing staff there has been no documentation of melena, hematochezia, hematemesis or coffee-ground emesis.  Prior GI workup: EGD 3/21: No esophageal varices, small hiatal hernia, no H. pylori, no celiac Colonoscopy 3/21: 1 tubular adenoma, 7 mm removed from sigmoid, normal distal 5 cm of terminal ileum, prior ileocolectomy, internal hemorrhoids, biopsies negative for microscopic colitis  She has history of exploratory laparotomy, right colectomy, end ileostomy, takedown of end ileostomy, small bowel resection, cholecystectomy and ileocecectomy in the past.  Patient is very confused, does not engage in conversations.   Past Medical History:  Diagnosis Date   Chronic diarrhea    Cirrhosis, non-alcoholic 05/01/2022   pt stated on admission history review   Heart murmur    Hypertension    Kidney stone    Obesity    Pulmonary embolism    Short gut syndrome    Thyroid disease     Past Surgical History:  Procedure Laterality Date   CHOLECYSTECTOMY     COLON SURGERY     HERNIA REPAIR     ILEOSTOMY     ILEOSTOMY CLOSURE     KIDNEY STONE SURGERY     WRIST SURGERY      Prior to Admission medications   Medication Sig Start Date End Date Taking? Authorizing Provider  acetaminophen (TYLENOL) 325 MG tablet Take 650 mg by mouth every 6 (six) hours as needed for  moderate pain or headache. Patient used this medication for her headache.   Yes [provider]  allopurinol (ZYLOPRIM) 100 MG tablet Take 100 mg by mouth daily. 12/31/21  Yes [provider]  atorvastatin (LIPITOR) 40 MG tablet Take 40 mg by mouth in the morning. 01/19/22  Yes [provider]  colchicine 0.6 MG tablet Take 0.6 mg by mouth daily as needed (gout).   Yes [provider]  cyanocobalamin (VITAMIN B12) 1000 MCG tablet Take 2,000 mcg by mouth daily.   Yes [provider]  EPINEPHrine 0.3 mg/0.3 mL IJ SOAJ injection Inject 0.3 mg into the muscle as needed for anaphylaxis. 08/07/19  Yes [provider]  famotidine (PEPCID) 40 MG tablet Take 40 mg by mouth daily.   Yes [provider]  furosemide (LASIX) 40 MG tablet Take 40 mg by mouth 2 (two) times daily. 02/16/22  Yes [provider]  HYDROcodone-acetaminophen (NORCO) 10-325 MG tablet Take 1 tablet by mouth every 6 (six) hours as needed for moderate pain or severe pain. 02/25/22  Yes [provider]  hydrOXYzine (ATARAX/VISTARIL) 25 MG tablet Take 25 mg by mouth 3 (three) times daily as needed for itching.   Yes [provider]  IRON-VITAMIN C PO Take 1 tablet by mouth daily.   Yes [provider]  levothyroxine (SYNTHROID) 150 MCG tablet Take 150 mcg by mouth daily before breakfast. 10/26/21  Yes [provider]  magnesium oxide (MAG-OX) 400 MG tablet Take 400 mg by mouth daily.   Yes [provider]  methocarbamol (ROBAXIN)  500 MG tablet Take 500 mg by mouth daily. 03/04/22  Yes [provider]  metoprolol succinate (TOPROL-XL) 25 MG 24 hr tablet Take 12.5 mg by mouth daily as needed (afib).   Yes [provider]  midodrine (PROAMATINE) 5 MG tablet Take 5 mg by mouth daily as needed (low blood pressure).   Yes [provider]  naloxone (NARCAN) nasal spray 4 mg/0.1 mL Place 1 spray into the nose as  needed (accidental overdose). 03/25/22  Yes [provider]  neomycin-polymyxin b-dexamethasone (MAXITROL) 3.5-10000-0.1 OINT Apply to eye. 04/29/22  Yes [provider]  nortriptyline (PAMELOR) 25 MG capsule Take 25 mg by mouth at bedtime as needed for sleep.   Yes [provider]  NYSTATIN powder Apply 1 Application topically daily as needed (rash). 02/19/15  Yes [provider]  potassium chloride (MICRO-K) 10 MEQ CR capsule Take 20 mEq by mouth 2 (two) times daily. 08/21/14  Yes [provider]  promethazine (PHENERGAN) 12.5 MG tablet Take 12.5 mg by mouth every 6 (six) hours as needed for nausea or vomiting.   Yes [provider]  rifaximin (XIFAXAN) 550 MG TABS tablet Take 550 mg by mouth 2 (two) times daily.   Yes [provider]  spironolactone (ALDACTONE) 25 MG tablet Take 25 mg by mouth daily. 11/08/21  Yes [provider]  venlafaxine XR (EFFEXOR-XR) 75 MG 24 hr capsule Take 75 mg by mouth daily with breakfast. 08/17/21  Yes [provider]  Vitamin D, Ergocalciferol, (DRISDOL) 50000 UNITS CAPS Take 50,000 Units by mouth every 7 (seven) days.   Yes [provider]  azithromycin (ZITHROMAX) 250 MG tablet Take 1 tablet (250 mg total) by mouth daily. Take first 2 tablets together, then 1 every day until finished. Patient not taking: Reported on 05/01/2022 01/11/21   Prosperi, Christian H, PA-C  furosemide (LASIX) 20 MG tablet Take 1 tablet (20 mg total) by mouth daily. Patient not taking: Reported on 05/01/2022 01/11/21   Prosperi, Christian H, PA-C  oxyCODONE-acetaminophen (PERCOCET/ROXICET) 5-325 MG per tablet Take 1 tablet by mouth every 4 (four) hours as needed for pain. Patient not taking: Reported on 05/01/2022 02/27/12   Zadie Rhine, MD    Current Facility-Administered Medications  Medication Dose Route Frequency Provider Last Rate Last Admin   azithromycin [facility]) tablet 500 mg  500 mg Oral Daily  Burnadette Pop, MD   500 mg at 05/03/22 1610   cefTRIAXone (ROCEPHIN) 2 g in sodium chloride 0.9 % 100 mL IVPB  2 g Intravenous Q24H Adhikari, Amrit, MD       enoxaparin (LOVENOX) injection 60 mg  0.5 mg/kg Subcutaneous Q24H Shade, Christine E, RPH   60 mg at 05/02/22 1007   fluconazole (DIFLUCAN) tablet 150 mg  150 mg Oral Daily Burnadette Pop, MD   150 mg at 05/03/22 9604   furosemide (LASIX) injection 20 mg  20 mg Intravenous Q12H Adhikari, Willia Craze, MD   20 mg at 05/03/22 2117   hydrocortisone cream 1 % 1 Application  1 Application Topical TID PRN Burnadette Pop, MD       ketorolac (ACULAR) 0.5 % ophthalmic solution 1 drop  1 drop Left Eye QID Adhikari, Amrit, MD       lactulose (CHRONULAC) 10 GM/15ML solution 20 g  20 g Oral TID Burnadette Pop, MD   20 g at 05/03/22 2116   levothyroxine (SYNTHROID) tablet 150 mcg  150 mcg Oral Q0600 Tu, Ching T, DO   150 mcg at 05/04/22 [phone]  lip balm (CARMEX) ointment 1 Application  1 Application Topical PRN Adhikari, Amrit, MD       midodrine (PROAMATINE) tablet 10 mg  10 mg Oral TID WC Burnadette Pop, MD   10 mg at 05/03/22 1601   neomycin-polymyxin b-dexamethasone (MAXITROL) ophthalmic ointment   Left Eye QID Tu, Ching T, DO   1 Application at 05/03/22 2120   nystatin ointment (MYCOSTATIN)   Topical BID Tu, Ching T, DO   Given at 05/03/22 2120   oxyCODONE (Oxy IR/ROXICODONE) immediate release tablet 10 mg  10 mg Oral Q6H PRN Burnadette Pop, MD   10 mg at 05/03/22 0339   pantoprazole (PROTONIX) injection 40 mg  40 mg Intravenous Q12H Adhikari, Willia Craze, MD   40 mg at 05/03/22 2117   rifaximin (XIFAXAN) tablet 550 mg  550 mg Oral BID Tu, Ching T, DO   550 mg at 05/03/22 2117   spironolactone (ALDACTONE) tablet 25 mg  25 mg Oral Daily Adhikari, Willia Craze, MD   25 mg at 05/03/22 1154   vancomycin (VANCOCIN) IVPB 1000 mg/200 mL premix  1,000 mg Intravenous Q24H Winfield Rast, RPH   Stopped at 05/03/22 1517   venlafaxine XR (EFFEXOR-XR) 24 hr capsule 75 mg  75  mg Oral Daily Tu, Ching T, DO   75 mg at 05/03/22 8295    Allergies as of 04/30/2022 - Review Complete 04/30/2022  Allergen Reaction Noted   Ivp dye [iodinated contrast media] Anaphylaxis 06/05/2011   Ace inhibitors Swelling 06/05/2011   Doxycycline Rash 07/05/2015   Penicillins Rash 06/05/2011   Zofran [ondansetron] Rash 07/05/2015    History reviewed. No pertinent family history.  Social History   Socioeconomic History   Marital status: Single    Spouse name: Not on file   Number of children: 1   Years of education: Not on file   Highest education level: Not on file  Occupational History   Not on file  Tobacco Use   Smoking status: Former    Types: Cigarettes    Quit date: 38    Years since quitting: 37.3    Passive exposure: Past   Smokeless tobacco: Never  Vaping Use   Vaping Use: Never used  Substance and Sexual Activity   Alcohol use: No   Drug use: No   Sexual activity: Not Currently    Birth control/protection: Post-menopausal  Other Topics Concern   Not on file  Social History Narrative   Not on file   Social Determinants of Health   Financial Resource Strain: Not on file  Food Insecurity: No Food Insecurity (05/01/2022)   Hunger Vital Sign    Worried About Running Out of Food in the Last Year: Never true    Ran Out of Food in the Last Year: Never true  Transportation Needs: No Transportation Needs (05/01/2022)   PRAPARE - Administrator, Civil Service (Medical): No    Lack of Transportation (Non-Medical): No  Physical Activity: Not on file  Stress: Not on file  Social Connections: Not on file  Intimate Partner Violence: Not At Risk (05/01/2022)   Humiliation, Afraid, Rape, and Kick questionnaire    Fear of Current or Ex-Partner: No    Emotionally Abused: No    Physically Abused: No    Sexually Abused: No    Review of Systems: As per HPI Physical Exam: Vital signs in last 24 hours: Temp:  [97.6 F (36.4 C)-98.5 F (36.9 C)] 98.5  F (36.9 C) (04/24 0820) Pulse Rate:  [  65-103] 87 (04/24 0820) Resp:  [13-31] 20 (04/24 0820) BP: (80-138)/(15-86) 129/81 (04/24 0820) SpO2:  [82 %-100 %] 99 % (04/24 0820) Last BM Date : 05/03/22  General:   Overweight, confused, does not follow commands Head:  Normocephalic and atraumatic. Eyes:  Sclera clear, no icterus.   Prominent pallor Ears:  Normal auditory acuity. Nose:  No deformity, discharge,  or lesions. Mouth:  No deformity or lesions.  Oropharynx pink & moist. Neck:  Supple; no masses or thyromegaly. Lungs:  Clear throughout to auscultation.   No wheezes, crackles, or rhonchi. No acute distress. Heart:  Regular rate and rhythm; no murmurs, clicks, rubs,  or gallops. Extremities:  Without clubbing or edema. Neurologic:  Awake but confused, does not follow commands, asterixis noted Skin:  Intact without significant lesions or rashes. Psych:  Alert and cooperative. Normal mood and affect. Abdomen:  Soft, nontender and nondistended. No masses, hepatosplenomegaly or hernias noted. Normal bowel sounds, without guarding, and without rebound.         Lab Results: Recent Labs    05/02/22 0855 05/03/22 0338 05/04/22 0427  WBC 25.1* 11.8* 12.7*  HGB 7.3* 6.8* 8.5*  HCT 23.7* 22.0* 26.0*  PLT 155 134* 138*   BMET Recent Labs    05/02/22 0318 05/03/22 0338 05/04/22 0427  NA 133* 132* 136  K 4.3 4.0 3.5  CL 108 108 111  CO2 13* 15* 18*  GLUCOSE 88 147* 100*  BUN 23 25* 28*  CREATININE 1.26* 1.27* 1.14*  CALCIUM 7.3* 7.3* 7.9*   LFT Recent Labs    05/04/22 0427  PROT 6.1*  ALBUMIN 1.9*  AST 29  ALT 27  ALKPHOS 73  BILITOT 1.6*   PT/INR No results for input(s): "LABPROT", "INR" in the last 72 hours.  Studies/Results: No results found.  Impression: Decompensated cirrhosis,MELD sodium score 20(Sodium 136, creatinine 1.14, total bili 1.6, INR 2.4) Hepatic encephalopathy Iron deficiency anemia, FOBT positive, bowel movement reported as brown on  05/03/2022 at 7:50 AM CT showed trace bilateral pleural effusions, cirrhosis, portal hypertension, small ascites, diffuse subcutaneous edema and anasarca  Multiple comorbidities-currently admitted with cellulitis, septic shock, recent eye surgery, acute kidney injury, dyslipidemia, gout, hypothyroidism, depression, morbid obesity  Plan: Ascites is too small for paracentesis and evaluation for SBP. Patient is already on ceftriaxone 2 g every 24 hours and vancomycin 1 g every 24 hours. She is also on azithromycin and fluconazole.  Lactulose 20 g 3 times a day, increase to 30 g 3 times a day as patient has persistent hepatic encephalopathy. Continue Xifaxan 550 mg twice a day.  Patient is on furosemide 20 mg IV every 12 hours and spironolactone 25 mg a day, for ascites and anasarca, will continue to monitor renal function.  Avoid narcotics if possible.  No plans for endoscopic intervention without obvious melena or hematochezia. Recommend monitor H&H and transfuse if needed. Continue pantoprazole 40 mg every 12 hours for now.  LOS: 4 days   Kerin Salen, MD  05/04/2022, 10:39 AM

## 2022-05-05 DIAGNOSIS — A419 Sepsis, unspecified organism: Secondary | ICD-10-CM | POA: Diagnosis not present

## 2022-05-05 LAB — CBC
HCT: 22.2 % — ABNORMAL LOW (ref 36.0–46.0)
Hemoglobin: 7.4 g/dL — ABNORMAL LOW (ref 12.0–15.0)
MCH: 29.4 pg (ref 26.0–34.0)
MCHC: 33.3 g/dL (ref 30.0–36.0)
MCV: 88.1 fL (ref 80.0–100.0)
Platelets: 131 10*3/uL — ABNORMAL LOW (ref 150–400)
RBC: 2.52 MIL/uL — ABNORMAL LOW (ref 3.87–5.11)
RDW: 19.6 % — ABNORMAL HIGH (ref 11.5–15.5)
WBC: 9.7 10*3/uL (ref 4.0–10.5)
nRBC: 0.9 % — ABNORMAL HIGH (ref 0.0–0.2)

## 2022-05-05 LAB — BASIC METABOLIC PANEL
Anion gap: 10 (ref 5–15)
BUN: 28 mg/dL — ABNORMAL HIGH (ref 8–23)
CO2: 17 mmol/L — ABNORMAL LOW (ref 22–32)
Calcium: 7.6 mg/dL — ABNORMAL LOW (ref 8.9–10.3)
Chloride: 112 mmol/L — ABNORMAL HIGH (ref 98–111)
Creatinine, Ser: 1.21 mg/dL — ABNORMAL HIGH (ref 0.44–1.00)
GFR, Estimated: 50 mL/min — ABNORMAL LOW (ref >60)
Glucose, Bld: 92 mg/dL (ref 70–99)
Potassium: 3.1 mmol/L — ABNORMAL LOW (ref 3.5–5.1)
Sodium: 139 mmol/L (ref 135–145)

## 2022-05-05 LAB — CULTURE, BLOOD (ROUTINE X 2)

## 2022-05-05 MED ORDER — PANTOPRAZOLE SODIUM 40 MG PO TBEC
40.0000 mg | DELAYED_RELEASE_TABLET | Freq: Two times a day (BID) | ORAL | Status: DC
  Administered 2022-05-05 – 2022-05-10 (×10): 40 mg via ORAL
  Filled 2022-05-05 (×10): qty 1

## 2022-05-05 MED ORDER — POTASSIUM CHLORIDE CRYS ER 20 MEQ PO TBCR
40.0000 meq | EXTENDED_RELEASE_TABLET | Freq: Every day | ORAL | Status: DC
  Administered 2022-05-05 – 2022-05-10 (×6): 40 meq via ORAL
  Filled 2022-05-05 (×6): qty 2

## 2022-05-05 MED ORDER — CEFADROXIL 500 MG PO CAPS
500.0000 mg | ORAL_CAPSULE | Freq: Two times a day (BID) | ORAL | Status: AC
  Administered 2022-05-05 – 2022-05-07 (×6): 500 mg via ORAL
  Filled 2022-05-05 (×6): qty 1

## 2022-05-05 MED ORDER — LACTULOSE 10 GM/15ML PO SOLN
10.0000 g | Freq: Three times a day (TID) | ORAL | Status: DC
  Administered 2022-05-05 – 2022-05-08 (×6): 10 g via ORAL
  Filled 2022-05-05 (×11): qty 30

## 2022-05-05 NOTE — Progress Notes (Signed)
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  Became febrile up to 101 F at home, little confused to her son.Has been dealing with yeast infection beneath left breast and all around her groin/vulvarvaginal region. Just started doing nystatin powder.  On presentation ,she was hypotensive, tachycardic.  Lab work showed WC count of 28,000, elevated lactic acid level up to the range of 5.  Chest x-ray did not show any pneumonia.  CT chest/abdomen/pelvis showed trace bilateral pleural effusion, atelectasis of left lower lobe but pneumonia not excluded.  Found to have edema/erythema around left breast, left flank ,vulvovaginal area.  Patient was admitted for the management of septic shock likely from cellulitis.  Started on broad spectrum antibiotics, antifungal .  Hospital course remarkable for  hepatic encephalopathy, anemia with positive fecal blood.  GI consulted,now signed off. Overall condition improving.  Patient remains alert and oriented today.  Plan for PT/OT evaluation  Assessment & Plan:  Principal Problem:   Sepsis Active Problems:   Hypotension   Liver cirrhosis secondary to NASH   AKI (acute kidney injury)   Candidal skin infection   History of eye surgery   Obesity, Class III, BMI 40-49.9 (morbid obesity)  Septic shock: Presented with fever, hypotension, tachycardia, lactic acidosis,leucocytosis.  Likely from cellulitis  but chest imaging could not rule out left lower lobe infiltrate. Found to have erythema/swelling of left breast, left flank ,groin/vulvovaginal area.  Started on broad-spectrum antibiotics.   Urine  culture showing   Klebsiella pneumonia.  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  Continue antifungal for total of 7 days course. As per ID, antibiotics changed to cefadroxil Also takes midodrine at home as needed, restarted as  10 mg tid. Foley has been placed on admission, will give voiding trial  Liver cirrhosis/NASH/anasarca/hypokalemia : Has edema/anasarca. Takes Lasix, spironolactone at home.  Currently on Lasix 20 mg IV twice daily.  On rifaximin, restarted.  Ammonia level was high, started on lactulose .She follows with Dr Nedra Hai at [facility].  Continue IV Lasix as long as renal function is stable,can be changed to Lasix 40 mg daily on discharge  Normocytic anemia: Hemoglobin dropped to 6 on 4/23.  Patient has chronic iron deficiency, iron level very low.  Gets regular iron infusion.  Denies any hematochezia or melena.  Positive  FOBT.  Given a dose of iron infusion on 4/22.  GI consulted: Recommended to monitor and continue Protonix.  No plan for intervention for now.  Check CBC tomorrow Also has mild thrombocytopenia: Most likely secondary to history of cirrhosis.  Recent history of eye surgery:Blind in left eye with recent gunderson flap surgery on 4/19 with [facility] ophthalmology.  Continue antibiotic ophthalmic ointment, ketorolac eye drop  Itchy rash: Complained of intense itchy rash on her back.  Noticed to have erythematous rash on the back.  Local ointment/oral Benadryl not helping.  Started on prednisone with improvement .  But patient became more confused show prednisone discontinued.  Ordered Atarax.  Eating better  AKI: Creatinine of 1.5 on presentation.  Stable  Hypokalemia: Supplement with potassium  Hyperlipidemia: On Lipitor  History of gout: On colchicine as needed.  Hypothyroidism: On Synthyroid  History of depression:on  Venlafaxine.  Morbid obesity: BMI of 48.3  Deconditioning/debility: Ambulates with the help of  walker.  Will consult PT/OT        DVT prophylaxis:Lovenox     Code Status: Full Code  Family Communication: Discussed with son at beside on 4/25  Patient status:Inpatient  Patient is from :Home  Anticipated discharge ZO:XWRU  Estimated DC date:1-2 days   Consultants: GI  Procedures:None  Antimicrobials:  Anti-infectives (From admission, onward)    Start     Dose/Rate Route Frequency Ordered Stop   05/04/22 1200  ceFEPIme (MAXIPIME) 2 g in sodium chloride 0.9 % 100 mL IVPB  Status:  Discontinued        2 g 200 mL/hr over 30 Minutes Intravenous Every 8 hours 05/04/22 0823 05/04/22 1001   05/04/22 1100  cefTRIAXone (ROCEPHIN) 2 g in sodium chloride 0.9 % 100 mL IVPB  Status:  Discontinued        2 g 200 mL/hr over 30 Minutes Intravenous Every 24 hours 05/04/22 1001 05/05/22 1147   05/02/22 1400  vancomycin (VANCOCIN) IVPB 1000 mg/200 mL premix  Status:  Discontinued        1,000 mg 200 mL/hr over 60 Minutes Intravenous Every 24 hours 05/02/22 1309 05/05/22 1147   05/02/22 1230  ceFEPIme (MAXIPIME) 2 g in sodium chloride 0.9 % 100 mL IVPB  Status:  Discontinued        2 g 200 mL/hr over 30 Minutes Intravenous Every 12 hours 05/02/22 1141 05/04/22 0823   05/02/22 1000  vancomycin (VANCOREADY) IVPB 1500 mg/300 mL  Status:  Discontinued        1,500 mg 150 mL/hr over 120 Minutes Intravenous Every 36 hours 05/01/22 0055 05/01/22 1325   05/02/22 1000  fluconazole (DIFLUCAN) tablet 150 mg        150 mg Oral Daily 05/01/22 1125 05/08/22 0959   05/02/22 1000  cefTRIAXone (ROCEPHIN) 2 g in sodium chloride 0.9 % 100 mL IVPB  Status:  Discontinued        2 g 200 mL/hr over 30 Minutes Intravenous Every 24 hours 05/01/22 1325 05/02/22 1051   05/01/22 1415  azithromycin (ZITHROMAX) tablet 500 mg        500 mg Oral Daily 05/01/22 1325 05/05/22 0911   05/01/22 1000  ceFEPIme (MAXIPIME) 2 g in sodium chloride 0.9 % 100 mL IVPB  Status:  Discontinued        2 g 200 mL/hr over 30  Minutes Intravenous Every 12 hours 05/01/22 0055 05/01/22 1325   05/01/22 0030  fluconazole (DIFLUCAN) tablet 150 mg        150 mg Oral  Once 05/01/22 0027 05/01/22 0249   05/01/22 0000  rifaximin (XIFAXAN) tablet 550 mg        550 mg Oral 2 times daily 04/30/22 2346     04/30/22 2115  vancomycin (VANCOREADY) IVPB 2000 mg/400 mL        2,000 mg 200 mL/hr over 120 Minutes Intravenous  Once 04/30/22 2100 05/01/22 0053   04/30/22 2100  ceFEPIme (MAXIPIME) 2 g in sodium chloride 0.9 % 100 mL IVPB        2 g 200 mL/hr over 30 Minutes Intravenous  Once 04/30/22 2057 04/30/22 2310       Subjective: Patient seen and examined the bedside today.  Looks very comfortable today.  Alert and oriented.  Lying in bed.  Left eye pain better.  Has been diuresing well.  Son at the bedside  Objective: Vitals:   05/04/22 0820 05/04/22 1342 05/04/22 2044 05/05/22 0632  BP: 129/81 (!) 114/56 (!) 103/54 110/83  Pulse: 87 75 70 79  Resp: Temp: 98.5 F (36.9 C) 97.7 F (36.5 C) 97.6 F (36.4 C) 97.7 F (36.5 C)  TempSrc: Oral Axillary Oral Oral  SpO2: 99% 100% 97% 100%  Weight:      Height:        Intake/Output Summary (Last 24 hours) at 05/05/2022 1148 Last data filed at 05/05/2022 0201 Gross per 24 hour  Intake 480 ml  Output 1675 ml  Net -1195 ml   Filed Weights   04/30/22 2038 05/01/22 0351  Weight: 112.5 kg 119.8 kg    Examination:    General exam: Morbidly obese, deconditioned HEENT: Erythematous eye on the left Respiratory system:  no wheezes or crackles, diminished sounds bilaterally Cardiovascular system: S1 & S2 heard, RRR.  Gastrointestinal system: Abdomen is nondistended, soft and nontender. Central nervous system: Alert and oriented Skin:  Anasarca,Erythematous rash on the back, scattered purpura on the bilateral lower extremities and trunk, edematous/erythematous skin on left breast, left flank   Data Reviewed: I have personally reviewed following labs and  imaging studies  CBC: Recent Labs  Lab 04/30/22 2019 04/30/22 2034 05/01/22 0620 05/02/22 0855 05/03/22 0338 05/04/22 0427 05/05/22 0410  WBC 28.0*  --  25.4* 25.1* 11.8* 12.7* 9.7  NEUTROABS 25.9*  --   --   --   --   --   --   HGB 8.9*   < > 8.5* 7.3* 6.8* 8.5* 7.4*  HCT 28.4*   < > 26.9* 23.7* 22.0* 26.0* 22.2*  MCV 92.8  --  91.2 93.3 91.7 89.7 88.1  PLT 195  --  116* 155 134* 138* 131*   < > = values in this interval not displayed.   Basic Metabolic Panel: Recent Labs  Lab 04/30/22 2019 04/30/22 2034 05/02/22 0318 05/03/22 0338 05/04/22 0427 05/05/22 0410  NA 136 137 133* 132* 136 139  K 3.6 3.7 4.3 4.0 3.5 3.1*  CL 110 109 108 108 111 112*  CO2 11*  --  13* 15* 18* 17*  GLUCOSE 94 97 88 147* 100* 92  BUN 25* 28* 28*  CREATININE 1.51* 1.20* 1.26* 1.27* 1.14* 1.21*  CALCIUM 7.8*  --  7.3* 7.3* 7.9* 7.6*     Recent Results (from the past 240 hour(s))  Blood Culture (routine x 2)     Status: Abnormal (Preliminary result)   Collection Time: 04/30/22  8:19 PM   Specimen: Left Antecubital; Blood  Result Value Ref Range Status   Specimen Description   Final    LEFT ANTECUBITAL BOTTLES DRAWN AEROBIC AND ANAEROBIC Performed at [facility], [address] [place], [place] [address]    Special Requests   Final    Blood Culture adequate volume Performed at [facility], [address] [place], [place] [address]    Culture  Setup Time   Final    GRAM NEGATIVE RODS AEROBIC BOTTLE ONLY CRITICAL RESULT CALLED TO, READ BACK BY AND VERIFIED WITH: PHARMD N. GLOGOVAC [id number] FCP    Culture (A)  Final    FLAVOBACTERIUM ODORATUM Sent to [facility] for further susceptibility testing. Performed at [facility], [address] [place], [place] [address]    Report Status PENDING  Incomplete   Organism ID, Bacteria FLAVOBACTERIUM ODORATUM  Final  Susceptibility   Flavobacterium odoratum - MIC*    CEFTAZIDIME  >=64 RESISTANT Resistant     CIPROFLOXACIN 2 INTERMEDIATE Intermediate     GENTAMICIN >=16 RESISTANT Resistant     IMIPENEM >=16 RESISTANT Resistant     TRIMETH/SULFA 160 RESISTANT Resistant     PIP/TAZO >=128 RESISTANT Resistant     * FLAVOBACTERIUM ODORATUM  Blood Culture ID Panel (Reflexed)     Status: None   Collection Time: 04/30/22  8:19 PM  Result Value Ref Range Status   Enterococcus faecalis NOT DETECTED NOT DETECTED Final   Enterococcus Faecium NOT DETECTED NOT DETECTED Final   Listeria monocytogenes NOT DETECTED NOT DETECTED Final   Staphylococcus species NOT DETECTED NOT DETECTED Final   Staphylococcus aureus (BCID) NOT DETECTED NOT DETECTED Final   Staphylococcus epidermidis NOT DETECTED NOT DETECTED Final   Staphylococcus lugdunensis NOT DETECTED NOT DETECTED Final   Streptococcus species NOT DETECTED NOT DETECTED Final   Streptococcus agalactiae NOT DETECTED NOT DETECTED Final   Streptococcus pneumoniae NOT DETECTED NOT DETECTED Final   Streptococcus pyogenes NOT DETECTED NOT DETECTED Final   A.calcoaceticus-baumannii NOT DETECTED NOT DETECTED Final   Bacteroides fragilis NOT DETECTED NOT DETECTED Final   Enterobacterales NOT DETECTED NOT DETECTED Final   Enterobacter cloacae complex NOT DETECTED NOT DETECTED Final   Escherichia coli NOT DETECTED NOT DETECTED Final   Klebsiella aerogenes NOT DETECTED NOT DETECTED Final   Klebsiella oxytoca NOT DETECTED NOT DETECTED Final   Klebsiella pneumoniae NOT DETECTED NOT DETECTED Final   Proteus species NOT DETECTED NOT DETECTED Final   Salmonella species NOT DETECTED NOT DETECTED Final   Serratia marcescens NOT DETECTED NOT DETECTED Final   Haemophilus influenzae NOT DETECTED NOT DETECTED Final   Neisseria meningitidis NOT DETECTED NOT DETECTED Final   Pseudomonas aeruginosa NOT DETECTED NOT DETECTED Final   Stenotrophomonas maltophilia NOT DETECTED NOT DETECTED Final   Candida albicans NOT DETECTED NOT DETECTED Final    Candida auris NOT DETECTED NOT DETECTED Final   Candida glabrata NOT DETECTED NOT DETECTED Final   Candida krusei NOT DETECTED NOT DETECTED Final   Candida parapsilosis NOT DETECTED NOT DETECTED Final   Candida tropicalis NOT DETECTED NOT DETECTED Final   Cryptococcus neoformans/gattii NOT DETECTED NOT DETECTED Final    Comment: Performed at [facility], [address] [place], [place] [address]  Urine Culture (for pregnant, neutropenic or urologic patients or patients with an indwelling urinary catheter)     Status: Abnormal   Collection Time: 04/30/22 10:32 PM   Specimen: Urine, Clean Catch  Result Value Ref Range Status   Specimen Description   Final    URINE, CLEAN CATCH Performed at [facility], [address] [place], [place] [address]    Special Requests   Final    NONE Performed at [facility], [address] [place], [place] [address]    Culture >=100,000 COLONIES/mL KLEBSIELLA PNEUMONIAE (A)  Final   Report Status 05/04/2022 FINAL  Final   Organism ID, Bacteria KLEBSIELLA PNEUMONIAE (A)  Final      Susceptibility   Klebsiella pneumoniae - MIC*    AMPICILLIN >=32 RESISTANT Resistant     CEFAZOLIN <=4 SENSITIVE Sensitive     CEFEPIME <=0.12 SENSITIVE Sensitive     CEFTRIAXONE <=0.25 SENSITIVE Sensitive     CIPROFLOXACIN <=0.25 SENSITIVE Sensitive     GENTAMICIN <=1 SENSITIVE Sensitive     IMIPENEM <=0.25 SENSITIVE Sensitive     NITROFURANTOIN 64 INTERMEDIATE Intermediate  TRIMETH/SULFA <=20 SENSITIVE Sensitive     AMPICILLIN/SULBACTAM 4 SENSITIVE Sensitive     PIP/TAZO <=4 SENSITIVE Sensitive     * >=100,000 COLONIES/mL KLEBSIELLA PNEUMONIAE  MRSA Next Gen by PCR, Nasal     Status: None   Collection Time: 05/01/22  4:11 AM   Specimen: Nasal Mucosa; Nasal Swab  Result Value Ref Range Status   MRSA by PCR Next Gen NOT DETECTED NOT DETECTED Final    Comment: (NOTE) The GeneXpert MRSA Assay (FDA approved for NASAL  specimens only), is one component of a comprehensive MRSA colonization surveillance program. It is not intended to diagnose MRSA infection nor to guide or monitor treatment for MRSA infections. Test performance is not FDA approved in patients less than 50 years old. Performed at [facility], [address], [place], [place] [address]   Blood Culture (routine x 2)     Status: None (Preliminary result)   Collection Time: 05/01/22 11:09 AM   Specimen: BLOOD LEFT ARM  Result Value Ref Range Status   Specimen Description   Final    BLOOD LEFT ARM Performed at [facility], [address], [place], [place] [address]    Special Requests   Final    AEROBIC BOTTLE ONLY Blood Culture adequate volume Performed at [facility], [address], [place], [place] [address]    Culture   Final    NO GROWTH 4 DAYS Performed at [facility], [address] [place], [place] [address]    Report Status PENDING  Incomplete     Radiology Studies: No results found.  Scheduled Meds:  enoxaparin (LOVENOX) injection  0.5 mg/kg Subcutaneous Q24H   fluconazole  150 mg Oral Daily   furosemide  20 mg Intravenous Q12H   ketorolac  1 drop Left Eye QID   lactulose  10 g Oral TID   levothyroxine  150 mcg Oral Q0600   midodrine  10 mg Oral TID WC   neomycin-polymyxin b-dexamethasone   Left Eye QID   nystatin ointment   Topical BID   pantoprazole (PROTONIX) IV  40 mg Intravenous Q12H   potassium chloride  40 mEq Oral Daily   rifaximin  550 mg Oral BID   spironolactone  25 mg Oral Daily   venlafaxine XR  75 mg Oral Daily   Continuous Infusions:     LOS: 5 days   Burnadette Pop, MD [facility] P4/25/2024, 11:48 AM

## 2022-05-05 NOTE — Care Management Important Message (Signed)
Important Message  Patient Details IM Letter placed in Patient's room. Name: [name] MRN: [id number] Date of Birth: [birth year]   Medicare Important Message Given:  Yes     [name] 05/05/2022, 11:30 AM

## 2022-05-05 NOTE — Progress Notes (Signed)
Safety sitter at bedside, maintaining safety measures and redirecting patient as needed.

## 2022-05-05 NOTE — Plan of Care (Signed)
Id/asp note  Patient with cirrhosis admitted for sepsis unclear etiology. Question cellulitis Improved on vanc/ceftriaxone  Bcx 1 of 2 set flavobacterium odoratum Ucx klebsiella, not esbl Repeat bcx negative    A/p Agree primary team maybe uti plus cellulitis Agree also flavobacterium likely contaminant -- improved without targetted tx (id had requested micro to send for susceptibility on avycaz, mino/tigecycline, cefideracol in case she clinically declines)  -finish 3 more days of treatment with cefadroxil -- this would cover cellulitis/uti -ok to stop vanc/iv ceftriaxone -disposition per primary team -discussed with team

## 2022-05-05 NOTE — Plan of Care (Signed)
  Problem: Education: Goal: Knowledge of General Education information will improve Description Including pain rating scale, medication(s)/side effects and non-pharmacologic comfort measures Outcome: Progressing   Problem: Health Behavior/Discharge Planning: Goal: Ability to manage health-related needs will improve Outcome: Progressing   

## 2022-05-05 NOTE — Progress Notes (Signed)
Subjective: Son present at bedside who states that whenever patient receives narcotics and Benadryl, she has worsening hepatic encephalopathy. Since receiving increased dose of lactulose, and having several bowel movements, she seems more awake and oriented.  Objective: Vital signs in last 24 hours: Temp:  [97.6 F (36.4 C)-97.7 F (36.5 C)] 97.7 F (36.5 C) (04/25 1610) Pulse Rate:  [70-79] 79 (04/25 0632) Resp:  [20] 20 (04/25 9604) BP: (103-114)/(54-83) 110/83 (04/25 5409) SpO2:  [97 %-100 %] 100 % (04/25 8119) Weight change:  Last BM Date : 05/03/22  PE: Obese, alert, awake, oriented x 3 today GENERAL: Nonicteric, mild pallor, no asterixis  ABDOMEN: Obese, abdominal wall edema and skin changes of cellulitis EXTREMITIES: No obvious pitting edema  Lab Results: Results for orders placed or performed during the hospital encounter of 04/30/22 (from the past 48 hour(s))  Occult blood card to lab, stool     Status: Abnormal   Collection Time: 05/03/22  1:20 PM  Result Value Ref Range   Fecal Occult Bld POSITIVE (A) NEGATIVE    Comment: Performed at [facility], [address], [place], [place] [address]  CBC     Status: Abnormal   Collection Time: 05/04/22  4:27 AM  Result Value Ref Range   WBC 12.7 (H) 4.0 - 10.5 K/uL   RBC 2.90 (L) 3.87 - 5.11 MIL/uL   Hemoglobin 8.5 (L) 12.0 - 15.0 g/dL    Comment: REPEATED TO VERIFY POST TRANSFUSION SPECIMEN    HCT 26.0 (L) 36.0 - 46.0 %   MCV 89.7 80.0 - 100.0 fL   MCH 29.3 26.0 - 34.0 pg   MCHC 32.7 30.0 - 36.0 g/dL   RDW 95.6 (H) 21.3 - 08.6 %   Platelets 138 (L) 150 - 400 K/uL   nRBC 0.2 0.0 - 0.2 %    Comment: Performed at [facility], [address] [place], [place] [address]  Comprehensive metabolic panel     Status: Abnormal   Collection Time: 05/04/22  4:27 AM  Result Value Ref Range   Sodium 136 135 - 145 mmol/L   Potassium 3.5 3.5 - 5.1 mmol/L   Chloride 111 98 - 111 mmol/L   CO2  18 (L) 22 - 32 mmol/L   Glucose, Bld 100 (H) 70 - 99 mg/dL    Comment: Glucose reference range applies only to samples taken after fasting for at least 8 hours.   BUN 28 (H) 8 - 23 mg/dL   Creatinine, Ser 9.62 (H) 0.44 - 1.00 mg/dL   Calcium 7.9 (L) 8.9 - 10.3 mg/dL   Total Protein 6.1 (L) 6.5 - 8.1 g/dL   Albumin 1.9 (L) 3.5 - 5.0 g/dL   AST 29 15 - 41 U/L   ALT 27 0 - 44 U/L   Alkaline Phosphatase 73 38 - 126 U/L   Total Bilirubin 1.6 (H) 0.3 - 1.2 mg/dL   GFR, Estimated 54 (L) >60 mL/min    Comment: (NOTE) Calculated using the CKD-EPI Creatinine Equation (2021)    Anion gap 7 5 - 15    Comment: Performed at [facility], [address] [place], [place] [address]  Ammonia     Status: None   Collection Time: 05/04/22  4:32 AM  Result Value Ref Range   Ammonia 29 9 - 35 umol/L    Comment: Performed at [facility], [address], [place], [place] [address]  CBC     Status: Abnormal  Collection Time: 05/05/22  4:10 AM  Result Value Ref Range   WBC 9.7 4.0 - 10.5 K/uL   RBC 2.52 (L) 3.87 - 5.11 MIL/uL   Hemoglobin 7.4 (L) 12.0 - 15.0 g/dL   HCT 16.1 (L) 09.6 - 04.5 %   MCV 88.1 80.0 - 100.0 fL   MCH 29.4 26.0 - 34.0 pg   MCHC 33.3 30.0 - 36.0 g/dL   RDW 40.9 (H) 81.1 - 91.4 %   Platelets 131 (L) 150 - 400 K/uL   nRBC 0.9 (H) 0.0 - 0.2 %    Comment: Performed at [facility], [address] [place], [place] [address]  Basic metabolic panel     Status: Abnormal   Collection Time: 05/05/22  4:10 AM  Result Value Ref Range   Sodium 139 135 - 145 mmol/L   Potassium 3.1 (L) 3.5 - 5.1 mmol/L   Chloride 112 (H) 98 - 111 mmol/L   CO2 17 (L) 22 - 32 mmol/L   Glucose, Bld 92 70 - 99 mg/dL    Comment: Glucose reference range applies only to samples taken after fasting for at least 8 hours.   BUN 28 (H) 8 - 23 mg/dL   Creatinine, Ser 6.21 (H) 0.44 - 1.00 mg/dL   Calcium 7.6 (L) 8.9 - 10.3 mg/dL   GFR, Estimated 50 (L) >60  mL/min    Comment: (NOTE) Calculated using the CKD-EPI Creatinine Equation (2021)    Anion gap 10 5 - 15    Comment: Performed at [facility], [address], [place], [place] [address]    Studies/Results: No results found.  Medications: I have reviewed the patient's current medications.  Assessment: Decompensated liver cirrhosis Hepatic encephalopathy Anemia MELD sodium(creatinine 1.21, total bili 1.6, sodium 139,INR was 2.4)  20, 7 to 10% estimated 90-day mortality  Admitted with septic shock likely related to cellulitis Recent eye surgery, blind in left eye Obese Deconditioned  Hemoglobin stable at 7.4, no melena, no hematochezia As per documentation patient has been having brown stools X 11with lactulose  Plan: Advised patient and family members to avoid narcotics and sedatives.  Continue Lasix 20 mg twice daily and spironolactone 25 mg for anasarca and edema as renal function seems to be fairly stable.  Continue lactulose, will decrease dose to 10 g 3 times a day, continue Xifaxan 550 mg twice a day.  From GI standpoint, management of cirrhosis will need to be done as an outpatient, patient follows up with Dr. Nedra Hai at [facility].  No endoscopic intervention planned.  GI will sign off, please recall if needed.  Kerin Salen, MD 05/05/2022, 8:43 AM

## 2022-05-06 DIAGNOSIS — A419 Sepsis, unspecified organism: Secondary | ICD-10-CM | POA: Diagnosis not present

## 2022-05-06 LAB — CBC
HCT: 26.5 % — ABNORMAL LOW (ref 36.0–46.0)
Hemoglobin: 8.5 g/dL — ABNORMAL LOW (ref 12.0–15.0)
MCH: 28.8 pg (ref 26.0–34.0)
MCHC: 32.1 g/dL (ref 30.0–36.0)
MCV: 89.8 fL (ref 80.0–100.0)
Platelets: 138 10*3/uL — ABNORMAL LOW (ref 150–400)
RBC: 2.95 MIL/uL — ABNORMAL LOW (ref 3.87–5.11)
RDW: 20.1 % — ABNORMAL HIGH (ref 11.5–15.5)
WBC: 8.5 10*3/uL (ref 4.0–10.5)
nRBC: 0.8 % — ABNORMAL HIGH (ref 0.0–0.2)

## 2022-05-06 LAB — BASIC METABOLIC PANEL
Anion gap: 8 (ref 5–15)
BUN: 24 mg/dL — ABNORMAL HIGH (ref 8–23)
CO2: 19 mmol/L — ABNORMAL LOW (ref 22–32)
Calcium: 7.7 mg/dL — ABNORMAL LOW (ref 8.9–10.3)
Chloride: 112 mmol/L — ABNORMAL HIGH (ref 98–111)
Creatinine, Ser: 1.12 mg/dL — ABNORMAL HIGH (ref 0.44–1.00)
GFR, Estimated: 55 mL/min — ABNORMAL LOW (ref >60)
Glucose, Bld: 77 mg/dL (ref 70–99)
Potassium: 3.3 mmol/L — ABNORMAL LOW (ref 3.5–5.1)
Sodium: 139 mmol/L (ref 135–145)

## 2022-05-06 LAB — CULTURE, BLOOD (ROUTINE X 2)
Culture: NO GROWTH
Special Requests: ADEQUATE

## 2022-05-06 LAB — MAGNESIUM: Magnesium: 1.4 mg/dL — ABNORMAL LOW (ref 1.7–2.4)

## 2022-05-06 MED ORDER — MAGNESIUM SULFATE 4 GM/100ML IV SOLN
4.0000 g | Freq: Once | INTRAVENOUS | Status: AC
  Administered 2022-05-06: 4 g via INTRAVENOUS
  Filled 2022-05-06: qty 100

## 2022-05-06 MED ORDER — NYSTATIN 100000 UNIT/ML MT SUSP
5.0000 mL | Freq: Four times a day (QID) | OROMUCOSAL | Status: AC
  Administered 2022-05-06 – 2022-05-08 (×8): 500000 [IU] via ORAL
  Filled 2022-05-06 (×7): qty 5

## 2022-05-06 MED ORDER — POTASSIUM CHLORIDE CRYS ER 20 MEQ PO TBCR
40.0000 meq | EXTENDED_RELEASE_TABLET | Freq: Once | ORAL | Status: AC
  Administered 2022-05-06: 40 meq via ORAL
  Filled 2022-05-06: qty 2

## 2022-05-06 NOTE — Progress Notes (Signed)
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  Became febrile up to 101 F at home, little confused to her son.Has been dealing with yeast infection beneath left breast and all around her groin/vulvarvaginal region. Just started doing nystatin powder.  On presentation ,she was hypotensive, tachycardic.  Lab work showed WC count of 28,000, elevated lactic acid level up to the range of 5.  Chest x-ray did not show any pneumonia.  CT chest/abdomen/pelvis showed trace bilateral pleural effusion, atelectasis of left lower lobe but pneumonia not excluded.  Found to have edema/erythema around left breast, left flank ,vulvovaginal area.  Patient was admitted for the management of septic shock likely from cellulitis.  Started on broad spectrum antibiotics, antifungal .  Hospital course remarkable for  hepatic encephalopathy, anemia with positive fecal blood.  GI consulted,now signed off. Overall condition improving.  Patient remains alert and oriented today.  Plan for PT/OT evaluation  05/06/2022: Patient seen alongside patient's nurse and son.  According to patient's son, patient is making some improvement but now was diagnosed to be discharged back home.  Patient is not a significant historian.  Assessment & Plan:  Principal Problem:   Sepsis (HCC) Active Problems:   Hypotension   Liver cirrhosis secondary to NASH (HCC)   AKI (acute kidney injury) (HCC)   Candidal skin infection   History of eye surgery   Obesity, Class III, BMI 40-49.9 (morbid obesity) (HCC)  Septic shock: Presented with fever, hypotension, tachycardia, lactic  acidosis,leucocytosis.  Likely from cellulitis  but chest imaging could not rule out left lower lobe infiltrate. Found to have erythema/swelling of left breast, left flank ,groin/vulvovaginal area.  Started on broad-spectrum antibiotics.   Urine culture showing   Klebsiella pneumonia.  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  Continue antifungal for total of 7 days course. As per ID, antibiotics changed to cefadroxil Also takes midodrine at home as needed, restarted as  10 mg tid. Foley has been placed on admission, will give voiding trial 05/06/2022: Sepsis physiology has resolved.  Complete course of oral antibiotics.  Liver cirrhosis/NASH/anasarca/hypokalemia : Has edema/anasarca. Takes Lasix, spironolactone at home.  Currently on Lasix 20 mg IV twice daily.  On rifaximin, restarted.  Ammonia level was high, started on lactulose .She follows with Dr Nedra Hai at [facility].  Continue IV Lasix as long as renal function is stable,can be changed to Lasix 40 mg daily on discharge 05/06/2022: Likely follow-up with GI on discharge.  Normocytic anemia: Hemoglobin dropped to 6 on 4/23.  Patient has chronic iron deficiency, iron level very low.  Gets regular iron infusion.  Denies any hematochezia or melena.  Positive  FOBT.  Given a dose of iron infusion on 4/22.  GI consulted: Recommended to monitor and continue Protonix.  No plan for intervention for now.  Check CBC tomorrow Also has mild thrombocytopenia: Most likely secondary to history of cirrhosis. 05/06/2022: Likely multifactorial.  Optimize infection.  Patient also has liver cirrhosis.  Recent history of eye surgery:Blind in left eye with recent gunderson flap surgery on 4/19 with [facility] ophthalmology.  Continue antibiotic ophthalmic ointment, ketorolac eye drop  Itchy rash: Complained of  intense itchy rash on her back.  Noticed to have erythematous rash on the back.  Local ointment/oral Benadryl not helping.   Started on prednisone with improvement .  But patient became more confused show prednisone discontinued.  Ordered Atarax.  Eating better  AKI:  -Creatinine of 1.5 on presentation.   -Resolving.  Serum creatinine peaked at 1.51.  Serum creatinine is down to 1.12 today.   Hypokalemia:  Supplement with potassium -Potassium is 3.3 today. -Check magnesium level. -Give extra dose of KCl.  Hyperlipidemia: On Lipitor  History of gout: On colchicine as needed.  Hypothyroidism: On Synthyroid  History of depression:on  Venlafaxine.  Morbid obesity: BMI of 48.3  Deconditioning/debility: Ambulates with the help of walker.  Will consult PT/OT        DVT prophylaxis:Lovenox     Code Status: Full Code  Family Communication: Discussed with son at beside on 4/25  Patient status:Inpatient  Patient is from :Home  Anticipated discharge ZO:XWRU  Estimated DC date:1-2 days   Consultants: GI  Procedures:None  Antimicrobials:  Anti-infectives (From admission, onward)    Start     Dose/Rate Route Frequency Ordered Stop   05/05/22 1245  cefadroxil (DURICEF) capsule 500 mg        500 mg Oral 2 times daily 05/05/22 1149 05/08/22 0959   05/04/22 1200  ceFEPIme (MAXIPIME) 2 g in sodium chloride 0.9 % 100 mL IVPB  Status:  Discontinued        2 g 200 mL/hr over 30 Minutes Intravenous Every 8 hours 05/04/22 0823 05/04/22 1001   05/04/22 1100  cefTRIAXone (ROCEPHIN) 2 g in sodium chloride 0.9 % 100 mL IVPB  Status:  Discontinued        2 g 200 mL/hr over 30 Minutes Intravenous Every 24 hours 05/04/22 1001 05/05/22 1147   05/02/22 1400  vancomycin (VANCOCIN) IVPB 1000 mg/200 mL premix  Status:  Discontinued        1,000 mg 200 mL/hr over 60 Minutes Intravenous Every 24 hours 05/02/22 1309 05/05/22 1147   05/02/22 1230  ceFEPIme (MAXIPIME) 2 g in sodium chloride 0.9 % 100 mL IVPB  Status:  Discontinued        2 g 200 mL/hr over 30 Minutes Intravenous Every 12 hours 05/02/22 1141 05/04/22  0823   05/02/22 1000  vancomycin (VANCOREADY) IVPB 1500 mg/300 mL  Status:  Discontinued        1,500 mg 150 mL/hr over 120 Minutes Intravenous Every 36 hours 05/01/22 0055 05/01/22 1325   05/02/22 1000  fluconazole (DIFLUCAN) tablet 150 mg        150 mg Oral Daily 05/01/22 1125 05/08/22 0959   05/02/22 1000  cefTRIAXone (ROCEPHIN) 2 g in sodium chloride 0.9 % 100 mL IVPB  Status:  Discontinued        2 g 200 mL/hr over 30 Minutes Intravenous Every 24 hours 05/01/22 1325 05/02/22 1051   05/01/22 1415  azithromycin (ZITHROMAX) tablet 500 mg        500 mg Oral Daily 05/01/22 1325 05/05/22 0911   05/01/22 1000  ceFEPIme (MAXIPIME) 2 g in sodium chloride 0.9 % 100 mL IVPB  Status:  Discontinued        2 g 200 mL/hr over 30 Minutes Intravenous Every 12 hours 05/01/22 0055 05/01/22 1325   05/01/22 0030  fluconazole (DIFLUCAN) tablet 150 mg        150 mg Oral  Once 05/01/22 0027 05/01/22 0249   05/01/22 0000  rifaximin (XIFAXAN) tablet  550 mg        550 mg Oral 2 times daily 04/30/22 2346     04/30/22 2115  vancomycin (VANCOREADY) IVPB 2000 mg/400 mL        2,000 mg 200 mL/hr over 120 Minutes Intravenous  Once 04/30/22 2100 05/01/22 0053   04/30/22 2100  ceFEPIme (MAXIPIME) 2 g in sodium chloride 0.9 % 100 mL IVPB        2 g 200 mL/hr over 30 Minutes Intravenous  Once 04/30/22 2057 04/30/22 2310       Subjective: Patient seen and examined the bedside today.  Looks very comfortable today.  Alert and oriented.  Lying in bed.  Left eye pain better.  Has been diuresing well.  Son at the bedside  Objective: Vitals:   05/05/22 1342 05/05/22 2105 05/06/22 0418 05/06/22 0720  BP: (!) 93/44 (!) 109/54 115/63   Pulse: 70 66 74   Resp: 18 17 17 15   Temp: 97.6 F (36.4 C) 98 F (36.7 C) 97.9 F (36.6 C)   TempSrc: Oral Oral Oral   SpO2: 99% 99% 99%   Weight:      Height:        Intake/Output Summary (Last 24 hours) at 05/06/2022 1126 Last data filed at 05/06/2022 1103 Gross per 24 hour   Intake 940 ml  Output 2250 ml  Net -1310 ml    Filed Weights   04/30/22 2038 05/01/22 0351  Weight: 112.5 kg 119.8 kg    Examination:    General exam: Morbidly obese, deconditioned HEENT: Erythematous eye on the left Respiratory system:  no wheezes or crackles, diminished sounds bilaterally Cardiovascular system: S1 & S2 heard, RRR.  Gastrointestinal system: Abdomen is morbidly obese, soft and nontender. Central nervous system: Alert and oriented Skin:  Anasarca,Erythematous rash on the back.  Edematous/erythematous skin of the breast.   Data Reviewed: I have personally reviewed following labs and imaging studies  CBC: Recent Labs  Lab 04/30/22 2019 04/30/22 2034 05/02/22 0855 05/03/22 0338 05/04/22 0427 05/05/22 0410 05/06/22 0417  WBC 28.0*   < > 25.1* 11.8* 12.7* 9.7 8.5  NEUTROABS 25.9*  --   --   --   --   --   --   HGB 8.9*   < > 7.3* 6.8* 8.5* 7.4* 8.5*  HCT 28.4*   < > 23.7* 22.0* 26.0* 22.2* 26.5*  MCV 92.8   < > 93.3 91.7 89.7 88.1 89.8  PLT 195   < > 155 134* 138* 131* 138*   < > = values in this interval not displayed.    Basic Metabolic Panel: Recent Labs  Lab 05/02/22 0318 05/03/22 0338 05/04/22 0427 05/05/22 0410 05/06/22 0417  NA 133* 132* 136 139 139  K 4.3 4.0 3.5 3.1* 3.3*  CL 108 108 111 112* 112*  CO2 13* 15* 18* 17* 19*  GLUCOSE 88 147* 100* 92 77  BUN 23 25* 28* 28* 24*  CREATININE 1.26* 1.27* 1.14* 1.21* 1.12*  CALCIUM 7.3* 7.3* 7.9* 7.6* 7.7*      Recent Results (from the past 240 hour(s))  Blood Culture (routine x 2)     Status: Abnormal (Preliminary result)   Collection Time: 04/30/22  8:19 PM   Specimen: Left Antecubital; Blood  Result Value Ref Range Status   Specimen Description   Final    LEFT ANTECUBITAL BOTTLES DRAWN AEROBIC AND ANAEROBIC Performed at [facility], [address], [place], [place] [address]    Special Requests  Final    Blood Culture adequate volume Performed at [facility], [address], [place], [place] [address]    Culture  Setup Time   Final    GRAM NEGATIVE RODS AEROBIC BOTTLE ONLY CRITICAL RESULT CALLED TO, READ BACK BY AND VERIFIED WITH: PHARMD N. GLOGOVAC [id number] FCP    Culture (A)  Final    FLAVOBACTERIUM ODORATUM Sent to [facility] for further susceptibility testing. Performed at [facility], [address], [place], [place] [address]    Report Status PENDING  Incomplete   Organism ID, Bacteria FLAVOBACTERIUM ODORATUM  Final      Susceptibility   Flavobacterium odoratum - MIC*    CEFTAZIDIME >=64 RESISTANT Resistant     CIPROFLOXACIN 2 INTERMEDIATE Intermediate     GENTAMICIN >=16 RESISTANT Resistant     IMIPENEM >=16 RESISTANT Resistant     TRIMETH/SULFA 160 RESISTANT Resistant     PIP/TAZO >=128 RESISTANT Resistant     * FLAVOBACTERIUM ODORATUM  Blood Culture ID Panel (Reflexed)     Status: None   Collection Time: 04/30/22  8:19 PM  Result Value Ref Range Status   Enterococcus faecalis NOT DETECTED NOT DETECTED Final   Enterococcus Faecium NOT DETECTED NOT DETECTED Final   Listeria monocytogenes NOT DETECTED NOT DETECTED Final   Staphylococcus species NOT DETECTED NOT DETECTED Final   Staphylococcus aureus (BCID) NOT DETECTED NOT DETECTED Final   Staphylococcus epidermidis NOT DETECTED NOT DETECTED Final   Staphylococcus lugdunensis NOT DETECTED NOT DETECTED Final   Streptococcus species NOT DETECTED NOT DETECTED Final   Streptococcus agalactiae NOT DETECTED NOT DETECTED Final   Streptococcus pneumoniae NOT DETECTED NOT DETECTED Final   Streptococcus pyogenes NOT DETECTED NOT DETECTED Final   A.calcoaceticus-baumannii NOT DETECTED NOT DETECTED Final   Bacteroides fragilis NOT DETECTED NOT DETECTED Final   Enterobacterales NOT DETECTED NOT DETECTED Final   Enterobacter cloacae complex NOT DETECTED NOT DETECTED Final   Escherichia coli NOT DETECTED NOT DETECTED Final   Klebsiella aerogenes NOT  DETECTED NOT DETECTED Final   Klebsiella oxytoca NOT DETECTED NOT DETECTED Final   Klebsiella pneumoniae NOT DETECTED NOT DETECTED Final   Proteus species NOT DETECTED NOT DETECTED Final   Salmonella species NOT DETECTED NOT DETECTED Final   Serratia marcescens NOT DETECTED NOT DETECTED Final   Haemophilus influenzae NOT DETECTED NOT DETECTED Final   Neisseria meningitidis NOT DETECTED NOT DETECTED Final   Pseudomonas aeruginosa NOT DETECTED NOT DETECTED Final   Stenotrophomonas maltophilia NOT DETECTED NOT DETECTED Final   Candida albicans NOT DETECTED NOT DETECTED Final   Candida auris NOT DETECTED NOT DETECTED Final   Candida glabrata NOT DETECTED NOT DETECTED Final   Candida krusei NOT DETECTED NOT DETECTED Final   Candida parapsilosis NOT DETECTED NOT DETECTED Final   Candida tropicalis NOT DETECTED NOT DETECTED Final   Cryptococcus neoformans/gattii NOT DETECTED NOT DETECTED Final    Comment: Performed at [facility], [address] [place], [place] [address]  Urine Culture (for pregnant, neutropenic or urologic patients or patients with an indwelling urinary catheter)     Status: Abnormal   Collection Time: 04/30/22 10:32 PM   Specimen: Urine, Clean Catch  Result Value Ref Range Status   Specimen Description   Final    URINE, CLEAN CATCH Performed at [facility], [address] [place], [place] [address]    Special Requests   Final    NONE Performed at [facility], [address], [place], [place] [address]    Culture >=100,000 COLONIES/mL KLEBSIELLA PNEUMONIAE (A)  Final   Report Status 05/04/2022 FINAL  Final   Organism ID, Bacteria KLEBSIELLA PNEUMONIAE (A)  Final      Susceptibility   Klebsiella pneumoniae - MIC*    AMPICILLIN >=32 RESISTANT Resistant     CEFAZOLIN <=4 SENSITIVE Sensitive     CEFEPIME <=0.12 SENSITIVE Sensitive     CEFTRIAXONE <=0.25 SENSITIVE Sensitive     CIPROFLOXACIN <=0.25 SENSITIVE Sensitive      GENTAMICIN <=1 SENSITIVE Sensitive     IMIPENEM <=0.25 SENSITIVE Sensitive     NITROFURANTOIN 64 INTERMEDIATE Intermediate     TRIMETH/SULFA <=20 SENSITIVE Sensitive     AMPICILLIN/SULBACTAM 4 SENSITIVE Sensitive     PIP/TAZO <=4 SENSITIVE Sensitive     * >=100,000 COLONIES/mL KLEBSIELLA PNEUMONIAE  MRSA Next Gen by PCR, Nasal     Status: None   Collection Time: 05/01/22  4:11 AM   Specimen: Nasal Mucosa; Nasal Swab  Result Value Ref Range Status   MRSA by PCR Next Gen NOT DETECTED NOT DETECTED Final    Comment: (NOTE) The GeneXpert MRSA Assay (FDA approved for NASAL specimens only), is one component of a comprehensive MRSA colonization surveillance program. It is not intended to diagnose MRSA infection nor to guide or monitor treatment for MRSA infections. Test performance is not FDA approved in patients less than 86 years old. Performed at [facility], [address], [place], [place] [address]   Blood Culture (routine x 2)     Status: None   Collection Time: 05/01/22 11:09 AM   Specimen: BLOOD LEFT ARM  Result Value Ref Range Status   Specimen Description   Final    BLOOD LEFT ARM Performed at [facility], [address], [place], [place] [address]    Special Requests   Final    AEROBIC BOTTLE ONLY Blood Culture adequate volume Performed at [facility], [address] [place], [place] [address]    Culture   Final    NO GROWTH 5 DAYS Performed at [facility], [address], [place], [place] [address]    Report Status 05/06/2022 FINAL  Final     Radiology Studies: No results found.  Scheduled Meds:  cefadroxil  500 mg Oral BID   enoxaparin (LOVENOX) injection  0.5 mg/kg Subcutaneous Q24H   fluconazole  150 mg Oral Daily   furosemide  20 mg Intravenous Q12H   ketorolac  1 drop Left Eye QID   lactulose  10 g Oral TID   levothyroxine  150 mcg Oral Q0600   midodrine  10 mg Oral TID WC   neomycin-polymyxin  b-dexamethasone   Left Eye QID   nystatin ointment   Topical BID   pantoprazole  40 mg Oral BID   potassium chloride  40 mEq Oral Daily   rifaximin  550 mg Oral BID   spironolactone  25 mg Oral Daily   venlafaxine XR  75 mg Oral Daily   Continuous Infusions:  Time spent: 55 minutes    LOS: 6 days   Barnetta Chapel, MD [facility] P4/26/2024, 11:26 AM

## 2022-05-06 NOTE — Evaluation (Signed)
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  Became febrile up to 101 F at home, little confused to her son.Pt also has yeast infection beneath left breast and all around her groin/vulvarvaginal region. Dx of septic shock, cellulitis, cirrhosis.  Clinical Impression  Pt admitted with above diagnosis. Min assist for supine to sit, min assist for sit to stand. Pt took several pivotal steps from bed to recliner with RW, distance limited by fatigue. Noted pt incontinent of bowel once she stood up, pt able to stand for ~90 seconds for cleanup. Pt has 24* assist from her son at home. HHPT recommended.  Pt currently with functional limitations due to the deficits listed below (see PT Problem List). Pt will benefit from acute skilled PT to increase their independence and safety with mobility to allow discharge.          Recommendations for follow up therapy are one component of a multi-disciplinary discharge planning process, led by the attending physician.  Recommendations may be updated based on patient status, additional functional criteria and insurance authorization.  Follow Up Recommendations       Assistance Recommended at Discharge Intermittent Supervision/Assistance  Patient can return home with the following  A little help with walking and/or transfers;A little help with bathing/dressing/bathroom;Assistance with cooking/housework;Assist for transportation;Help with stairs or ramp for entrance    Equipment Recommendations Wheelchair (measurements PT);Wheelchair cushion (measurements PT)   Recommendations for Other Services       Functional Status Assessment Patient has had a recent decline in their functional status and demonstrates the ability to make significant improvements in function in a reasonable and predictable amount of time.     Precautions / Restrictions Precautions Precautions: Fall Precaution Comments: denies falls in past 6 months Restrictions Weight Bearing Restrictions: No      Mobility  Bed Mobility Overal bed mobility: Needs Assistance Bed Mobility: Supine to Sit     Supine to sit: Min assist, HOB elevated     General bed mobility comments: assist to raise trunk and pivot hips to edge of bed, noted pt incontinent of bowel (nursing notified of need for linen change)    Transfers Overall transfer level: Needs assistance Equipment used: Rolling walker (2 wheels) Transfers: Sit to/from Stand, Bed to chair/wheelchair/BSC Sit to Stand: Min assist, From elevated surface   Step pivot transfers: Min guard       General transfer comment: VCs hand placement; pt took several pivotal steps from bed to recliner, distance limited by fatigue    Ambulation/Gait Ambulation/Gait assistance: Min guard Gait Distance (Feet): 3 Feet   Gait Pattern/deviations: Step-to pattern, Decreased stride length Gait velocity: decr     General Gait Details: bed to recliner, distance limited by fatigue  Stairs            Wheelchair Mobility    Modified Rankin (Stroke Patients Only)       Balance Overall balance assessment: Needs assistance Sitting-balance support: Feet supported, No upper extremity supported Sitting balance-Leahy Scale: Good     Standing balance support: Bilateral upper extremity supported, Reliant on assistive device for balance, During functional activity Standing balance-Leahy Scale: Poor  Pertinent Vitals/Pain Pain Assessment Pain Assessment: No/denies pain    Home Living  Family/patient expects to be discharged to:: Private residence Living Arrangements: Children Available Help at Discharge: Family;Available 24 hours/day   Home Access: Ramped entrance         Home Equipment: Rollator (4 wheels);Rolling Walker (2 wheels)      Prior Function Prior Level of Function : Independent/Modified Independent             Mobility Comments: uses rollator when going out, no AD in the home, denies falls in past 6 months       Hand Dominance        Extremity/Trunk Assessment   Upper Extremity Assessment Upper Extremity Assessment: Defer to OT evaluation    Lower Extremity Assessment Lower Extremity Assessment: Overall WFL for tasks assessed    Cervical / Trunk Assessment Cervical / Trunk Assessment: Normal  Communication   Communication: No difficulties  Cognition Arousal/Alertness: Awake/alert Behavior During Therapy: WFL for tasks assessed/performed Overall Cognitive Status: Within Functional Limits for tasks assessed                                          General Comments      Exercises General Exercises - Lower Extremity Ankle Circles/Pumps: AROM, Both, 10 reps, Supine Heel Slides: AROM, Both, 5 reps, Supine Shoulder Exercises Shoulder Flexion: AROM, Both, 10 reps, Seated   Assessment/Plan    PT Assessment Patient needs continued PT services  PT Problem List Decreased activity tolerance;Decreased mobility;Obesity       PT Treatment Interventions Gait training;Therapeutic activities;Balance training;Functional mobility training;Therapeutic exercise    PT Goals (Current goals can be found in the Care Plan section)  Acute Rehab PT Goals Patient Stated Goal: to travel to [facility] with her son in a couple weeks PT Goal Formulation: With patient/family Time For Goal Achievement: 05/20/22 Potential to Achieve Goals: Good    Frequency Min 1X/week     Co-evaluation               AM-PAC PT "6 Clicks"  Mobility  Outcome Measure Help needed turning from your back to your side while in a flat bed without using bedrails?: A Little Help needed moving from lying on your back to sitting on the side of a flat bed without using bedrails?: A Little Help needed moving to and from a bed to a chair (including a wheelchair)?: A Little Help needed standing up from a chair using your arms (e.g., wheelchair or bedside chair)?: A Little Help needed to walk in hospital room?: A Little Help needed climbing 3-5 steps with a railing? : A Lot 6 Click Score: 17    End of Session   Activity Tolerance: Patient limited by fatigue Patient left: in chair;with chair alarm set;with family/visitor present;with call bell/phone within reach Nurse Communication: Mobility status PT Visit Diagnosis: Difficulty in walking, not elsewhere classified (R26.2)    Time: 4098-1191 PT Time Calculation (min) (ACUTE ONLY): 22 min   Charges:   PT Evaluation $PT Eval Moderate Complexity: 1 Mod          Tamala Ser PT 05/06/2022  Acute Rehabilitation Services  Office [phone]

## 2022-05-06 NOTE — TOC Transition Note (Deleted)
Transition of Care [facility] - CM/SW Discharge Note   Patient Details  Name: [name] MRN: [id number] Date of Birth: 1955-03-10  Transition of Care [facility]) CM/SW Contact:  [name], RN Phone Number: 05/06/2022, 1:39 PM   Clinical Narrative:  [facility] referral for home 02, order placed with qualifications. [employer] for home 02- rep Jermaine, to deliver to room prior to discharge. No further TOC needs identified at this time.            Patient Goals and CMS Choice      Discharge Placement                         Discharge Plan and Services Additional resources added to the After Visit Summary for                                       Social Determinants of Health (SDOH) Interventions SDOH Screenings   Food Insecurity: No Food Insecurity (05/01/2022)  Housing: Low Risk  (05/01/2022)  Transportation Needs: No Transportation Needs (05/01/2022)  Utilities: Not At Risk (05/01/2022)  Tobacco Use: Medium Risk (05/01/2022)     Readmission Risk Interventions     No data to display

## 2022-05-06 NOTE — TOC Initial Note (Addendum)
Transition of Care [facility]) - Initial/Assessment Note    Patient Details  Name: [name] MRN: [id number] Date of Birth: [birth year]  Transition of Care [facility]) CM/SW Contact:    [name], RN Phone Number: 05/06/2022, 3:42 PM  Clinical Narrative:   NCM spoke with phone over the phone to introduce TOC/NCM role to review PT recommendation for [facility] PT. Son reports pt previously on service with [facility], prefers to keep them, takes pt to her appts,  son will provide transport at discharge. NCM outreached to [facility] rep-[name], confirmed able to accept pt for [facility] PT. Will continue to follow    - 4pm informed MD to order home w/c for delivery to room prior dc, no preference, [employer] rep- Jermaine accepted, will follow for orders.            Expected Discharge Plan: Home w Home Health Services Barriers to Discharge: Continued Medical Work up   Patient Goals and CMS Choice Patient states their goals for this hospitalization and ongoing recovery are:: Home with [facility] PT CMS Medicare.gov Compare Post Acute Care list provided to:: Other (Comment Required) [name] (son)) Choice offered to / list presented to : Adult Children [place] ownership interest in [facility].provided to:: Adult Children    Expected Discharge Plan and Services   Discharge Planning Services: CM Consult Post Acute Care Choice: Home Health Living arrangements for the past 2 months: Single Family Home                                      Prior Living Arrangements/Services Living arrangements for the past 2 months: Single Family Home Lives with:: Adult Children Patient language and need for interpreter reviewed:: Yes Do you feel safe going back to the place where you live?: Yes      Need for Family Participation in Patient Care: Yes (Comment) Care giver support system in place?: Yes (comment) Current home services: Home PT, DME (Rollator, Walker) Criminal Activity/Legal Involvement  Pertinent to Current Situation/Hospitalization: No - Comment as needed  Activities of Daily Living Home Assistive Devices/Equipment: Environmental consultant (specify type) ADL Screening (condition at time of admission) Patient's cognitive ability adequate to safely complete daily activities?: Yes Is the patient deaf or have difficulty hearing?: No Does the patient have difficulty seeing, even when wearing glasses/contacts?: No Does the patient have difficulty concentrating, remembering, or making decisions?: No Patient able to express need for assistance with ADLs?: Yes Does the patient have difficulty dressing or bathing?: No Independently performs ADLs?: Yes (appropriate for developmental age) Does the patient have difficulty walking or climbing stairs?: Yes Weakness of Legs: Both Weakness of Arms/Hands: None  Permission Sought/Granted Permission sought to share information with : Case Manager Permission granted to share information with : Yes, Verbal Permission Granted  Share Information with NAME: Fannie Knee, RN           Emotional Assessment Appearance:: Appears stated age       Alcohol / Substance Use: Not Applicable Psych Involvement: No (comment)  Admission diagnosis:  Sepsis (HCC) [A41.9] Sepsis due to cellulitis (HCC) [L03.90, A41.9] Cellulitis of trunk, unspecified site of trunk [L03.319] Sepsis, due to unspecified organism, unspecified whether acute organ dysfunction present [facility]) [A41.9] Patient Active Problem List   Diagnosis Date Noted   Sepsis (HCC) 05/01/2022   Candidal skin infection 05/01/2022   History of eye surgery 05/01/2022   Obesity, Class  III, BMI 40-49.9 (morbid obesity) (HCC) 05/01/2022   Sepsis due to cellulitis (HCC) 04/30/2022   Hypotension 04/30/2022   Liver cirrhosis secondary to NASH (HCC) 04/30/2022   AKI (acute kidney injury) (HCC) 04/30/2022   PCP:  [name], MD Pharmacy:   [id number] - [place], [place] - [address] [place] [address] Phone: [phone] Fax: [phone]  [id number], [place] - [address] AT [facility] [address] [place] [address] Phone: [phone] Fax: [phone]     Social Determinants of Health (SDOH) Social History: SDOH Screenings   Food Insecurity: No Food Insecurity (05/01/2022)  Housing: Low Risk  (05/01/2022)  Transportation Needs: No Transportation Needs (05/01/2022)  Utilities: Not At Risk (05/01/2022)  Tobacco Use: Medium Risk (05/01/2022)   SDOH Interventions:     Readmission Risk Interventions    05/06/2022    3:39 PM  Readmission Risk Prevention Plan  Transportation Screening Complete  PCP or Specialist Appt within 5-7 Days Complete  Home Care Screening Complete  Medication Review (RN CM) Complete

## 2022-05-06 NOTE — Evaluation (Signed)
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  Became febrile up to 101 F at home, little confused to her son.Pt also has yeast infection beneath left breast and all around her groin/vulvarvaginal region. Dx of septic shock, cellulitis, cirrhosis.   Clinical Impression   PTA, pt lived with son who assisted with IADL and intermittent assist with LB ADL. Upon eval, pt with decreased balance and activity tolerance. Pt performing LB ADL with min guard to doff socks and UB Adl with set-up. Pt performing SPT with min A for initial rise and light cueing. Per pt son can assist in home setting. Recommending continued OT in home setting and will follow acutely.    Recommendations for follow up therapy are one component of a multi-disciplinary discharge planning process, led by the attending physician.  Recommendations may be updated based on patient status, additional functional criteria and insurance authorization.   Assistance Recommended at Discharge Intermittent Supervision/Assistance  Patient can return home with the following A little help with walking and/or transfers;A little help with bathing/dressing/bathroom;Assistance with cooking/housework;Assist for transportation;Help with stairs or ramp for entrance;Direct supervision/assist for medications management;Direct supervision/assist for financial management    Functional Status Assessment  Patient has had a recent decline in their functional status and demonstrates the ability to make significant improvements in function in a reasonable and predictable  amount of time.  Equipment Recommendations  None recommended by OT    Recommendations for Other Services       Precautions / Restrictions Precautions Precautions: Fall Precaution Comments: denies falls in past 6 months Restrictions Weight Bearing Restrictions: No      Mobility Bed Mobility Overal bed mobility: Needs Assistance Bed Mobility: Supine to Sit, Sit to Supine     Supine to sit: HOB elevated, Min guard Sit to supine: Min guard   General bed mobility comments: Min guard, use of momentum, and increased time    Transfers Overall transfer level: Needs assistance Equipment used: Rolling walker (2 wheels) Transfers: Sit to/from Stand, Bed to chair/wheelchair/BSC Sit to Stand: Min assist, From elevated surface     Step pivot transfers: Min guard     General transfer comment: VC for hand placment      Balance Overall balance assessment: Needs assistance Sitting-balance support: Feet supported, No upper extremity supported Sitting balance-Leahy Scale: Good     Standing balance support: Bilateral upper extremity supported, Reliant on assistive device for balance, During functional activity Standing balance-Leahy Scale: Poor                             ADL either performed or assessed with clinical judgement   ADL Overall ADL's : Needs assistance/impaired Eating/Feeding: Modified independent;Sitting   Grooming: Set up;Sitting   Upper Body Bathing: Set up;Sitting   Lower Body Bathing: Min guard;Sitting/lateral leans   Upper Body Dressing : Set up;Sitting   Lower Body Dressing: Min guard;Sitting/lateral leans Lower Body Dressing Details (indicate cue type and reason): to doff socks Toilet Transfer: Min guard;Stand-pivot;Minimal assistance Toilet Transfer Details (indicate cue type and reason): min A to rise, then Min guard A for safety  Functional mobility during ADLs: Min guard;Rolling walker (2 wheels)       Vision Baseline  Vision/History:  (L eye blindness) Patient Visual Report: No change from baseline Additional Comments: No vision in L eye     Perception     Praxis      Pertinent Vitals/Pain Pain Assessment Pain Assessment: No/denies pain     Hand Dominance     Extremity/Trunk Assessment Upper Extremity Assessment Upper Extremity Assessment: Overall WFL for tasks assessed   Lower Extremity Assessment Lower Extremity Assessment: Defer to PT evaluation       Communication Communication Communication: No difficulties   Cognition Arousal/Alertness: Awake/alert Behavior During Therapy: WFL for tasks assessed/performed Overall Cognitive Status: Within Functional Limits for tasks assessed                                       General Comments       Exercises     Shoulder Instructions      Home Living Family/patient expects to be discharged to:: Private residence Living Arrangements: Children Available Help at Discharge: Family;Available 24 hours/day   Home Access: Ramped entrance           Bathroom Shower/Tub: Walk-in shower;Sponge bathes at baseline         Home Equipment: Rollator (4 wheels);Rolling Walker (2 wheels);BSC/3in1;Other (comment) (per pt, has bed pan at home as well?)          Prior Functioning/Environment Prior Level of Function : Independent/Modified Independent             Mobility Comments: uses rollator when going out, no AD in the home, denies falls in past 6 months ADLs Comments: Pt reports that she can normally do her ADL on her own. Uses BSC, sponge bathes. Son assists with IADL and occasionally socks/shoes        OT Problem List: Decreased strength;Decreased activity tolerance;Impaired vision/perception;Impaired balance (sitting and/or standing);Decreased knowledge of use of DME or AE;Obesity      OT Treatment/Interventions: Self-care/ADL training;Therapeutic exercise;DME and/or AE instruction;Balance  training;Patient/family education;Therapeutic activities    OT Goals(Current goals can be found in the care plan section) Acute Rehab OT Goals Patient Stated Goal: get better OT Goal Formulation: With patient Time For Goal Achievement: 05/20/22 Potential to Achieve Goals: Good  OT Frequency: Min 2X/week    Co-evaluation              AM-PAC OT "6 Clicks" Daily Activity     Outcome Measure Help from another person eating meals?: None Help from another person taking care of personal grooming?: A Little Help from another person toileting, which includes using toliet, bedpan, or urinal?: A Little Help from another person bathing (including washing, rinsing, drying)?: A Little Help from another person to put on and taking off regular upper body clothing?: A Little Help from another person to put on and taking off regular lower body clothing?: A Little 6 Click Score: 19   End of Session Equipment Utilized During Treatment: Rolling walker (2 wheels) Nurse Communication: Mobility status;Other (comment) (bloody bed pad pt reports from vaginal area where foley was removed. Pt also needing new purewick, but encouraged OOB to [facility] if RN/tech able due to pt light assist only for transfer)  Activity Tolerance: Patient tolerated treatment well Patient left: in bed;with call bell/phone within reach;with bed alarm set  OT Visit Diagnosis: Unsteadiness on feet (R26.81);Muscle weakness (generalized) (  M62.81)                Time: 1610-9604 OT Time Calculation (min): 17 min Charges:  OT General Charges $OT Visit: 1 Visit OT Evaluation $OT Eval Low Complexity: 1 Low  Tyler Deis, OTR/L [facility] Acute Rehabilitation Office: [phone]   Myrla Halsted 05/06/2022, 5:30 PM

## 2022-05-07 DIAGNOSIS — E8809 Other disorders of plasma-protein metabolism, not elsewhere classified: Secondary | ICD-10-CM | POA: Diagnosis not present

## 2022-05-07 DIAGNOSIS — N179 Acute kidney failure, unspecified: Secondary | ICD-10-CM | POA: Diagnosis not present

## 2022-05-07 LAB — RENAL FUNCTION PANEL
Albumin: 1.6 g/dL — ABNORMAL LOW (ref 3.5–5.0)
Anion gap: 8 (ref 5–15)
BUN: 21 mg/dL (ref 8–23)
CO2: 18 mmol/L — ABNORMAL LOW (ref 22–32)
Calcium: 7.5 mg/dL — ABNORMAL LOW (ref 8.9–10.3)
Chloride: 113 mmol/L — ABNORMAL HIGH (ref 98–111)
Creatinine, Ser: 1.06 mg/dL — ABNORMAL HIGH (ref 0.44–1.00)
GFR, Estimated: 59 mL/min — ABNORMAL LOW (ref >60)
Glucose, Bld: 115 mg/dL — ABNORMAL HIGH (ref 70–99)
Phosphorus: 2.5 mg/dL (ref 2.5–4.6)
Potassium: 3.4 mmol/L — ABNORMAL LOW (ref 3.5–5.1)
Sodium: 139 mmol/L (ref 135–145)

## 2022-05-07 LAB — MAGNESIUM: Magnesium: 2 mg/dL (ref 1.7–2.4)

## 2022-05-07 MED ORDER — POTASSIUM CHLORIDE CRYS ER 20 MEQ PO TBCR
40.0000 meq | EXTENDED_RELEASE_TABLET | Freq: Once | ORAL | Status: AC
  Administered 2022-05-07: 40 meq via ORAL
  Filled 2022-05-07: qty 2

## 2022-05-07 MED ORDER — ALBUMIN HUMAN 25 % IV SOLN
25.0000 g | Freq: Two times a day (BID) | INTRAVENOUS | Status: AC
  Administered 2022-05-07 – 2022-05-09 (×3): 25 g via INTRAVENOUS
  Filled 2022-05-07 (×4): qty 100

## 2022-05-07 NOTE — Progress Notes (Signed)
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  Became febrile up to 101 F at home, little confused to her son.Has been dealing with yeast infection beneath left breast and all around her groin/vulvarvaginal region. Just started doing nystatin powder.  On presentation ,she was hypotensive, tachycardic.  Lab work showed WC count of 28,000, elevated lactic acid level up to the range of 5.  Chest x-ray did not show any pneumonia.  CT chest/abdomen/pelvis showed trace bilateral pleural effusion, atelectasis of left lower lobe but pneumonia not excluded.  Found to have edema/erythema around left breast, left flank ,vulvovaginal area.  Patient was admitted for the management of septic shock likely from cellulitis.  Started on broad spectrum antibiotics, antifungal .  Hospital course remarkable for  hepatic encephalopathy, anemia with positive fecal blood.  GI consulted,now signed off. Overall condition improving.  Patient remains alert and oriented today.  Plan for PT/OT evaluation  05/06/2022: Patient seen alongside patient's nurse and son.  According to patient's son, patient is making some improvement but now was diagnosed to be discharged back home.  Patient is not a significant historian.  05/07/2022: Worsening right breast edema/induration.  Will elevate right breast.  Low threshold to proceed with MRI of the breast.  Assessment & Plan:  Principal Problem:   Sepsis (HCC) Active Problems:   Hypotension   Liver cirrhosis secondary to NASH (HCC)   AKI (acute kidney injury) (HCC)   Candidal skin infection   History of eye surgery    Obesity, Class III, BMI 40-49.9 (morbid obesity) (HCC)  Septic shock: Presented with fever, hypotension, tachycardia, lactic acidosis,leucocytosis.  Likely from cellulitis  but chest imaging could not rule out left lower lobe infiltrate. Found to have erythema/swelling of left breast, left flank ,groin/vulvovaginal area.  Started on broad-spectrum antibiotics.   Urine culture showing   Klebsiella pneumonia.  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  Continue antifungal for total of 7 days course. As per ID, antibiotics changed to cefadroxil Also takes midodrine at home as needed, restarted as  10 mg tid. Foley has been placed on admission, will give voiding trial 05/06/2022: Sepsis physiology has resolved.  Complete course of oral antibiotics.  Liver cirrhosis/NASH/anasarca/hypokalemia : Has edema/anasarca. Takes Lasix, spironolactone at home.  Currently on Lasix 20 mg IV twice daily.  On rifaximin, restarted.  Ammonia level was high, started on lactulose .She follows with Dr Nedra H[facility] at [facility].  Continue IV Lasix as long as renal function is stable,can be changed to Lasix 40 mg daily on discharge 05/06/2022: Likely follow-up with GI on discharge. 01/06/2023: Albumin is 1.6.  Worsening right breast induration/edema.  Cautious use of IV albumin.  Monitor respiratory status closely.    Normocytic anemia: Hemoglobin dropped to 6 on 4/23.  Patient has chronic iron deficiency, iron level very low.  Gets regular iron infusion.  Denies any hematochezia or melena.  Positive  FOBT.  Given a dose of iron infusion on 4/22.  GI consulted: Recommended to monitor and continue Protonix.  No plan for intervention for now.  Check CBC tomorrow Also has mild thrombocytopenia: Most likely secondary to history of cirrhosis. 05/06/2022: Likely  multifactorial.  Optimize infection.  Patient also has liver cirrhosis. 2724: Check CBC in the morning.  Recent history of eye surgery:Blind in  left eye with recent gunderson flap surgery on 4/19 with [facility] ophthalmology.  Continue antibiotic ophthalmic ointment, ketorolac eye drop  Itchy rash: Complained of intense itchy rash on her back.  Noticed to have erythematous rash on the back.  Local ointment/oral Benadryl not helping.  Started on prednisone with improvement .  But patient became more confused show prednisone discontinued.  Ordered Atarax.  Eating better  AKI:  -Creatinine of 1.5 on presentation.   -Resolving.  Serum creatinine peaked at 1.51.  Serum creatinine is down to 1.12 today.  05/07/2022: Serum creatinine is 1.06 today.  Hypokalemia:  Supplement with potassium -Potassium is 3.3 today. -Check magnesium level. -Give extra dose of KCl. 05/07/2022: Monitor and replete.  Will likely improve with IV albumin.  Hyperlipidemia: On Lipitor  History of gout: On colchicine as needed.  Hypothyroidism: On Synthyroid  History of depression:on  Venlafaxine.  Morbid obesity: BMI of 48.3  Deconditioning/debility: Ambulates with the help of walker.  Will consult PT/OT        DVT prophylaxis:Lovenox     Code Status: Full Code  Family Communication: Discussed with son at beside on 4/25  Patient status:Inpatient  Patient is from :Home  Anticipated discharge ZO:XWRU  Estimated DC date:1-2 days   Consultants: GI  Procedures:None  Antimicrobials:  Anti-infectives (From admission, onward)    Start     Dose/Rate Route Frequency Ordered Stop   05/05/22 1245  cefadroxil (DURICEF) capsule 500 mg        500 mg Oral 2 times daily 05/05/22 1149 05/08/22 0959   05/04/22 1200  ceFEPIme (MAXIPIME) 2 g in sodium chloride 0.9 % 100 mL IVPB  Status:  Discontinued        2 g 200 mL/hr over 30 Minutes Intravenous Every 8 hours 05/04/22 0823 05/04/22 1001   05/04/22 1100  cefTRIAXone (ROCEPHIN) 2 g in sodium chloride 0.9 % 100 mL IVPB  Status:  Discontinued        2 g 200 mL/hr over 30 Minutes Intravenous Every 24  hours 05/04/22 1001 05/05/22 1147   05/02/22 1400  vancomycin (VANCOCIN) IVPB 1000 mg/200 mL premix  Status:  Discontinued        1,000 mg 200 mL/hr over 60 Minutes Intravenous Every 24 hours 05/02/22 1309 05/05/22 1147   05/02/22 1230  ceFEPIme (MAXIPIME) 2 g in sodium chloride 0.9 % 100 mL IVPB  Status:  Discontinued        2 g 200 mL/hr over 30 Minutes Intravenous Every 12 hours 05/02/22 1141 05/04/22 0823   05/02/22 1000  vancomycin (VANCOREADY) IVPB 1500 mg/300 mL  Status:  Discontinued        1,500 mg 150 mL/hr over 120 Minutes Intravenous Every 36 hours 05/01/22 0055 05/01/22 1325   05/02/22 1000  fluconazole (DIFLUCAN) tablet 150 mg        150 mg Oral Daily 05/01/22 1125 05/07/22 1103   05/02/22 1000  cefTRIAXone (ROCEPHIN) 2 g in sodium chloride 0.9 % 100 mL IVPB  Status:  Discontinued        2 g 200 mL/hr over 30 Minutes Intravenous Every 24 hours 05/01/22 1325 05/02/22 1051   05/01/22 1415  azithromycin (ZITHROMAX) tablet 500 mg        500 mg Oral Daily 05/01/22 1325 05/05/22 0911   05/01/22 1000  ceFEPIme (MAXIPIME) 2 g in sodium  chloride 0.9 % 100 mL IVPB  Status:  Discontinued        2 g 200 mL/hr over 30 Minutes Intravenous Every 12 hours 05/01/22 0055 05/01/22 1325   05/01/22 0030  fluconazole (DIFLUCAN) tablet 150 mg        150 mg Oral  Once 05/01/22 0027 05/01/22 0249   05/01/22 0000  rifaximin (XIFAXAN) tablet 550 mg        550 mg Oral 2 times daily 04/30/22 2346     04/30/22 2115  vancomycin (VANCOREADY) IVPB 2000 mg/400 mL        2,000 mg 200 mL/hr over 120 Minutes Intravenous  Once 04/30/22 2100 05/01/22 0053   04/30/22 2100  ceFEPIme (MAXIPIME) 2 g in sodium chloride 0.9 % 100 mL IVPB        2 g 200 mL/hr over 30 Minutes Intravenous  Once 04/30/22 2057 04/30/22 2310       Subjective: Patient seen and examined the bedside today.  Looks very comfortable today.  Alert and oriented.  Lying in bed.  Left eye pain better.  Has been diuresing well.  Son at the  bedside  Objective: Vitals:   05/07/22 0715 05/07/22 0755 05/07/22 1256 05/07/22 1427  BP:  (!) 110/56 (!) 113/55   Pulse:  83 80   Resp: 19  19 19   Temp:  97.9 F (36.6 C)    TempSrc:  Oral    SpO2:  97% 97%   Weight:      Height:        Intake/Output Summary (Last 24 hours) at 05/07/2022 1643 Last data filed at 05/07/2022 1600 Gross per 24 hour  Intake 972 ml  Output 1150 ml  Net -178 ml    Filed Weights   04/30/22 2038 05/01/22 0351  Weight: 112.5 kg 119.8 kg    Examination:    General exam: Morbidly obese, deconditioned HEENT: Erythematous eye on the left Respiratory system:  no wheezes or crackles, diminished sounds bilaterally Cardiovascular system: S1 & S2 heard, RRR.  Gastrointestinal system: Abdomen is morbidly obese, soft and nontender. Central nervous system: Alert and oriented Skin:  Anasarca,Erythematous rash on the back.  Edematous/erythematous skin of the breast.   Data Reviewed: I have personally reviewed following labs and imaging studies  CBC: Recent Labs  Lab 04/30/22 2019 04/30/22 2034 05/02/22 0855 05/03/22 0338 05/04/22 0427 05/05/22 0410 05/06/22 0417  WBC 28.0*   < > 25.1* 11.8* 12.7* 9.7 8.5  NEUTROABS 25.9*  --   --   --   --   --   --   HGB 8.9*   < > 7.3* 6.8* 8.5* 7.4* 8.5*  HCT 28.4*   < > 23.7* 22.0* 26.0* 22.2* 26.5*  MCV 92.8   < > 93.3 91.7 89.7 88.1 89.8  PLT 195   < > 155 134* 138* 131* 138*   < > = values in this interval not displayed.    Basic Metabolic Panel: Recent Labs  Lab 05/03/22 0338 05/04/22 0427 05/05/22 0410 05/06/22 0417 05/06/22 1140 05/07/22 0434  NA 132* 136 139 139  --  139  K 4.0 3.5 3.1* 3.3*  --  3.4*  CL 108 111 112* 112*  --  113*  CO2 15* 18* 17* 19*  --  18*  GLUCOSE 147* 100* 92 77  --  115*  BUN 25* 28* 28* 24*  --  21  CREATININE 1.27* 1.14* 1.21* 1.12*  --  1.06*  CALCIUM 7.3* 7.9* 7.6*  7.7*  --  7.5*  MG  --   --   --   --  1.4* 2.0  PHOS  --   --   --   --   --  2.5       Recent Results (from the past 240 hour(s))  Blood Culture (routine x 2)     Status: Abnormal (Preliminary result)   Collection Time: 04/30/22  8:19 PM   Specimen: Left Antecubital; Blood  Result Value Ref Range Status   Specimen Description   Final    LEFT ANTECUBITAL BOTTLES DRAWN AEROBIC AND ANAEROBIC Performed at [facility], [address] [place], [place] [address]    Special Requests   Final    Blood Culture adequate volume Performed at [facility], [address] [place], [place] [address]    Culture  Setup Time   Final    GRAM NEGATIVE RODS AEROBIC BOTTLE ONLY CRITICAL RESULT CALLED TO, READ BACK BY AND VERIFIED WITH: PHARMD N. GLOGOVAC [id number] FCP    Culture (A)  Final    FLAVOBACTERIUM ODORATUM MULTI-DRUG RESISTANT ORGANISM Sent to [facility] for further susceptibility testing. Performed at [facility], [address] [place], [place] [address]    Report Status PENDING  Incomplete   Organism ID, Bacteria FLAVOBACTERIUM ODORATUM  Final      Susceptibility   Flavobacterium odoratum - MIC*    CEFTAZIDIME >=64 RESISTANT Resistant     CIPROFLOXACIN 2 INTERMEDIATE Intermediate     GENTAMICIN >=16 RESISTANT Resistant     IMIPENEM >=16 RESISTANT Resistant     TRIMETH/SULFA 160 RESISTANT Resistant     PIP/TAZO >=128 RESISTANT Resistant     * FLAVOBACTERIUM ODORATUM  Blood Culture ID Panel (Reflexed)     Status: None   Collection Time: 04/30/22  8:19 PM  Result Value Ref Range Status   Enterococcus faecalis NOT DETECTED NOT DETECTED Final   Enterococcus Faecium NOT DETECTED NOT DETECTED Final   Listeria monocytogenes NOT DETECTED NOT DETECTED Final   Staphylococcus species NOT DETECTED NOT DETECTED Final   Staphylococcus aureus (BCID) NOT DETECTED NOT DETECTED Final   Staphylococcus epidermidis NOT DETECTED NOT DETECTED Final   Staphylococcus lugdunensis NOT DETECTED NOT DETECTED Final   Streptococcus species  NOT DETECTED NOT DETECTED Final   Streptococcus agalactiae NOT DETECTED NOT DETECTED Final   Streptococcus pneumoniae NOT DETECTED NOT DETECTED Final   Streptococcus pyogenes NOT DETECTED NOT DETECTED Final   A.calcoaceticus-baumannii NOT DETECTED NOT DETECTED Final   Bacteroides fragilis NOT DETECTED NOT DETECTED Final   Enterobacterales NOT DETECTED NOT DETECTED Final   Enterobacter cloacae complex NOT DETECTED NOT DETECTED Final   Escherichia coli NOT DETECTED NOT DETECTED Final   Klebsiella aerogenes NOT DETECTED NOT DETECTED Final   Klebsiella oxytoca NOT DETECTED NOT DETECTED Final   Klebsiella pneumoniae NOT DETECTED NOT DETECTED Final   Proteus species NOT DETECTED NOT DETECTED Final   Salmonella species NOT DETECTED NOT DETECTED Final   Serratia marcescens NOT DETECTED NOT DETECTED Final   Haemophilus influenzae NOT DETECTED NOT DETECTED Final   Neisseria meningitidis NOT DETECTED NOT DETECTED Final   Pseudomonas aeruginosa NOT DETECTED NOT DETECTED Final   Stenotrophomonas maltophilia NOT DETECTED NOT DETECTED Final   Candida albicans NOT DETECTED NOT DETECTED Final   Candida auris NOT DETECTED NOT DETECTED Final   Candida glabrata NOT DETECTED NOT DETECTED Final   Candida krusei NOT DETECTED NOT DETECTED Final   Candida parapsilosis NOT DETECTED  NOT DETECTED Final   Candida tropicalis NOT DETECTED NOT DETECTED Final   Cryptococcus neoformans/gattii NOT DETECTED NOT DETECTED Final    Comment: Performed at [facility], [address] [place], [place] [address]  Urine Culture (for pregnant, neutropenic or urologic patients or patients with an indwelling urinary catheter)     Status: Abnormal   Collection Time: 04/30/22 10:32 PM   Specimen: Urine, Clean Catch  Result Value Ref Range Status   Specimen Description   Final    URINE, CLEAN CATCH Performed at [facility], [address], [place], [place] [address]    Special Requests   Final     NONE Performed at [facility], [address], [place], [place] [address]    Culture >=100,000 COLONIES/mL KLEBSIELLA PNEUMONIAE (A)  Final   Report Status 05/04/2022 FINAL  Final   Organism ID, Bacteria KLEBSIELLA PNEUMONIAE (A)  Final      Susceptibility   Klebsiella pneumoniae - MIC*    AMPICILLIN >=32 RESISTANT Resistant     CEFAZOLIN <=4 SENSITIVE Sensitive     CEFEPIME <=0.12 SENSITIVE Sensitive     CEFTRIAXONE <=0.25 SENSITIVE Sensitive     CIPROFLOXACIN <=0.25 SENSITIVE Sensitive     GENTAMICIN <=1 SENSITIVE Sensitive     IMIPENEM <=0.25 SENSITIVE Sensitive     NITROFURANTOIN 64 INTERMEDIATE Intermediate     TRIMETH/SULFA <=20 SENSITIVE Sensitive     AMPICILLIN/SULBACTAM 4 SENSITIVE Sensitive     PIP/TAZO <=4 SENSITIVE Sensitive     * >=100,000 COLONIES/mL KLEBSIELLA PNEUMONIAE  MRSA Next Gen by PCR, Nasal     Status: None   Collection Time: 05/01/22  4:11 AM   Specimen: Nasal Mucosa; Nasal Swab  Result Value Ref Range Status   MRSA by PCR Next Gen NOT DETECTED NOT DETECTED Final    Comment: (NOTE) The GeneXpert MRSA Assay (FDA approved for NASAL specimens only), is one component of a comprehensive MRSA colonization surveillance program. It is not intended to diagnose MRSA infection nor to guide or monitor treatment for MRSA infections. Test performance is not FDA approved in patients less than 56 years old. Performed at [facility], [address] [place], [place] [address]   Blood Culture (routine x 2)     Status: None   Collection Time: 05/01/22 11:09 AM   Specimen: BLOOD LEFT ARM  Result Value Ref Range Status   Specimen Description   Final    BLOOD LEFT ARM Performed at [facility], [address], [place], [place] [address]    Special Requests   Final    AEROBIC BOTTLE ONLY Blood Culture adequate volume Performed at [facility], [address], [place], [place] [address]    Culture   Final     NO GROWTH 5 DAYS Performed at [facility], [address], [place], [place] [address]    Report Status 05/06/2022 FINAL  Final     Radiology Studies: No results found.  Scheduled Meds:  cefadroxil  500 mg Oral BID   enoxaparin (LOVENOX) injection  0.5 mg/kg Subcutaneous Q24H   furosemide  20 mg Intravenous Q12H   ketorolac  1 drop Left Eye QID   lactulose  10 g Oral TID   levothyroxine  150 mcg Oral Q0600   midodrine  10 mg Oral TID WC   neomycin-polymyxin b-dexamethasone   Left Eye QID   nystatin  5 mL Oral QID   nystatin ointment  Topical BID   pantoprazole  40 mg Oral BID   potassium chloride  40 mEq Oral Daily   rifaximin  550 mg Oral BID   spironolactone  25 mg Oral Daily   venlafaxine XR  75 mg Oral Daily   Continuous Infusions:  Time spent: 35 minutes    LOS: 7 days   Barnetta [place], MD [facility] P4/27/2024, 4:43 PM

## 2022-05-08 ENCOUNTER — Inpatient Hospital Stay (HOSPITAL_COMMUNITY): Payer: Medicare (Managed Care)

## 2022-05-08 DIAGNOSIS — N61 Mastitis without abscess: Secondary | ICD-10-CM | POA: Diagnosis not present

## 2022-05-08 DIAGNOSIS — K7460 Unspecified cirrhosis of liver: Secondary | ICD-10-CM | POA: Diagnosis not present

## 2022-05-08 DIAGNOSIS — K7581 Nonalcoholic steatohepatitis (NASH): Secondary | ICD-10-CM | POA: Diagnosis not present

## 2022-05-08 DIAGNOSIS — A419 Sepsis, unspecified organism: Secondary | ICD-10-CM | POA: Diagnosis not present

## 2022-05-08 LAB — MAGNESIUM: Magnesium: 2 mg/dL (ref 1.7–2.4)

## 2022-05-08 LAB — COMPREHENSIVE METABOLIC PANEL
ALT: 24 U/L (ref 0–44)
AST: 21 U/L (ref 15–41)
Albumin: 1.6 g/dL — ABNORMAL LOW (ref 3.5–5.0)
Alkaline Phosphatase: 70 U/L (ref 38–126)
Anion gap: 9 (ref 5–15)
BUN: 17 mg/dL (ref 8–23)
CO2: 19 mmol/L — ABNORMAL LOW (ref 22–32)
Calcium: 7.5 mg/dL — ABNORMAL LOW (ref 8.9–10.3)
Chloride: 109 mmol/L (ref 98–111)
Creatinine, Ser: 0.93 mg/dL (ref 0.44–1.00)
GFR, Estimated: >60 mL/min (ref >60)
Glucose, Bld: 84 mg/dL (ref 70–99)
Potassium: 4.2 mmol/L (ref 3.5–5.1)
Sodium: 137 mmol/L (ref 135–145)
Total Bilirubin: 1.2 mg/dL (ref 0.3–1.2)
Total Protein: 4.9 g/dL — ABNORMAL LOW (ref 6.5–8.1)

## 2022-05-08 LAB — CBC
HCT: 28.2 % — ABNORMAL LOW (ref 36.0–46.0)
Hemoglobin: 8.7 g/dL — ABNORMAL LOW (ref 12.0–15.0)
MCH: 29.5 pg (ref 26.0–34.0)
MCHC: 30.9 g/dL (ref 30.0–36.0)
MCV: 95.6 fL (ref 80.0–100.0)
Platelets: 118 10*3/uL — ABNORMAL LOW (ref 150–400)
RBC: 2.95 MIL/uL — ABNORMAL LOW (ref 3.87–5.11)
RDW: 21.8 % — ABNORMAL HIGH (ref 11.5–15.5)
WBC: 8.2 10*3/uL (ref 4.0–10.5)
nRBC: 0.2 % (ref 0.0–0.2)

## 2022-05-08 LAB — PHOSPHORUS: Phosphorus: 2.9 mg/dL (ref 2.5–4.6)

## 2022-05-08 MED ORDER — ENOXAPARIN SODIUM 60 MG/0.6ML IJ SOSY: 0.5000 mg/kg | PREFILLED_SYRINGE | INTRAMUSCULAR | Status: DC

## 2022-05-08 MED ORDER — ORAL CARE MOUTH RINSE: 15.0000 mL | OROMUCOSAL | Status: DC | PRN

## 2022-05-08 MED ORDER — SODIUM CHLORIDE 0.9 % IV SOLN
2.0000 g | Freq: Two times a day (BID) | INTRAVENOUS | Status: DC
  Administered 2022-05-08 – 2022-05-09 (×3): 2 g via INTRAVENOUS
  Filled 2022-05-08 (×3): qty 12.5

## 2022-05-08 NOTE — Progress Notes (Addendum)
The patient has experienced bleeding appears from clitoris(bright red). Pt assessed,uncertain exact location of bleeding.Per night shift RN report this began after Foley Catheter removal x 2 days ago. The pt denies pain/discomfort of lower abdomen. Hospitalist provider updated, per V.O will hold Lovenox at this time. Pt remains alert, oriented x4, respirations equal and non labored.

## 2022-05-08 NOTE — Progress Notes (Signed)
Pharmacy Antibiotic Note  [name] is a 67 y.o. female admitted on 04/30/2022 with sepsis / R breast cellulitis. Has received a full course of abx but now with worsening pain and swelling there is concern for worsening cellulitis and need to rule out abscess. Besides localized worsening, no systemic symptoms present as afebrile, WBC wnl. Pharmacy has been consulted for cefepime dosing.  Plan: Start cefepime 2g IV Q12h Monitor clinical picture, renal function F/U imaging results, abx deescalation / LOT  If abscess on imaging, consider vancomycin and cefazolin for added MRSA coverage If no abscess, consider changing cefepime to cefazolin   Height: 5\' 2"  (157.5 cm) Weight: 119.8 kg (264 lb 1.8 oz) IBW/kg (Calculated) : 50.1  Temp (24hrs), Avg:98.7 F (37.1 C), Min:98.4 F (36.9 C), Max:98.9 F (37.2 C)  Recent Labs  Lab 05/02/22 0318 05/02/22 0855 05/03/22 0338 05/04/22 0427 05/05/22 0410 05/06/22 0417 05/07/22 0434 05/08/22 0403  WBC  --  25.1* 11.8* 12.7* 9.7 8.5  --   --   CREATININE 1.26*  --  1.27* 1.14* 1.21* 1.12* 1.06* 0.93  LATICACIDVEN 2.9*  --  2.4*  --   --   --   --   --     Estimated Creatinine Clearance: 75.3 mL/min (by C-G formula based on SCr of 0.93 mg/dL).    Allergies  Allergen Reactions   Ivp Dye [Iodinated Contrast Media] Anaphylaxis   Ace Inhibitors Swelling   Chlorhexidine     Pt has a rash, & made irritation skin further   Doxycycline Rash   Penicillins Rash    Did it involve swelling of the face/tongue/throat, SOB, or low BP? No Did it involve sudden or severe rash/hives, skin peeling, or any reaction on the inside of your mouth or nose? Yes Did you need to seek medical attention at a hospital or doctor's office? No When did it last happen? Several Years Ago      If all above answers are "NO", may proceed with cephalosporin use.     Zofran [Ondansetron] Rash    Antimicrobials this admission: 4/20 vancomycin >> 4/21, resumed 4/22 >>  4/24 4/20 cefepime >> 4/21, resumed 4/22 >> 4/24, resumed 4/28 >> 4/21 fluconazole >> 4/27 4/21 CTX >> 4/22 4/21 Azith >> 4/25 4/25 Cefadroxil >> 4/27  Dose adjustments this admission:   Microbiology results: 4/20 BCx: 1/3 flavobacterium (considered contaminant) 4/20 UA: few bacteria, 0-5 Epith, 0-5 WBC 4/20 UCx: >100k K.pneumoniae 4/21 MRSA PCR: not detected 4/22 UA: no bacteria, 0-5 Epith, 0-5 WBC  Thank you for allowing pharmacy to be a part of this patient's care.  Enzo Bi, PharmD, BCPS, BCIDP Clinical Pharmacist 05/08/2022 9:39 AM

## 2022-05-08 NOTE — Progress Notes (Signed)
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  Became febrile up to 101 F at home, little confused to her son. Has been dealing with yeast infection beneath left breast and all around her groin/vulvarvaginal region. Just started doing nystatin powder.  On presentation she was hypotensive, tachycardic.  Lab work showed WC count of 28,000, elevated lactic acid level up to the range of 5.  Chest x-ray did not show any pneumonia.  CT chest/abdomen/pelvis showed trace bilateral pleural effusion, atelectasis of left lower lobe but pneumonia not excluded.  Found to have edema/erythema around right breast.  Patient was admitted for the management of septic shock likely from cellulitis.  Started on broad spectrum antibiotics, antifungal .  Hospital course remarkable for  hepatic encephalopathy, anemia with positive fecal blood.  GI consulted now signed off.   Assessment & Plan:  Cellulitis involving right breast septic shock Presented with fever, hypotension, tachycardia, lactic acidosis,leucocytosis.  Likely from cellulitis but chest imaging could not rule out left lower lobe infiltrate.  She was initially started on broad-spectrum antibiotics.  Urine cultures grew Klebsiella. 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  She was also started on antifungal.  After discussions with ID she was changed over to cefadroxil.  She is completed course of antibiotics. However she feels worse this morning.  The swelling of the right breast has worsened and she is experience more pain.  Proceed with imaging studies  of the right breast to rule out abscess.  Place her back on antibiotics for now.  Also takes midodrine at home as needed, restarted as  10 mg tid.  Liver cirrhosis/NASH/anasarca/hypokalemia/hypoalbuminemia Has edema/anasarca. Takes Lasix, spironolactone at home.  Currently on Lasix 20 mg IV twice daily.   Ammonia level was high, started on lactulose. On rifaximin, restarted.  She follows with Dr Nedra Hai at [facility].   Normocytic anemia/thrombocytopenia Hemoglobin dropped to 6 on 4/23.  Patient has chronic iron deficiency, iron level very low.  Gets regular iron infusion.  Denies any hematochezia or melena.  Positive FOBT.  Given a dose of iron infusion on 4/22.   GI consulted: Recommended to monitor and continue Protonix.  No plan for intervention for now.   Also has mild thrombocytopenia: Most likely secondary to history of cirrhosis.  Recent history of eye surgery Blind in left eye with recent gunderson flap surgery on 4/19 with [facility] ophthalmology.  Continue antibiotic ophthalmic ointment, ketorolac eye drop  Itchy rash Complained of intense itchy rash on her back.  Noticed to have erythematous rash on the back.  Local ointment/oral Benadryl not helping.  Started on prednisone with improvement .  But patient became more confused show prednisone discontinued.  Ordered Atarax.  Eating better  AKI -Creatinine of 1.5 on presentation.   -Resolving.   Monitor urine output.  Avoid nephrotoxic agents.  Hypokalemia:  Normal today.  Magnesium is 2.0.  Hyperlipidemia On Lipitor  History of gout On colchicine as needed.  Hypothyroidism On Synthyroid  History of depression Venlafaxine.  Morbid obesity Estimated body mass index is 48.31 kg/m as calculated from the following:   Height as of this encounter: 5\' 2"  (  1.575 m).   Weight as of this encounter: 119.8 kg.   DVT prophylaxis:Lovenox Code Status: Full Code Family Communication: No family at bedside today Disposition: To be  determined    Consultants: GI  Procedures:None  Antimicrobials:  Anti-infectives (From admission, onward)    Start     Dose/Rate Route Frequency Ordered Stop   05/05/22 1245  cefadroxil (DURICEF) capsule 500 mg        500 mg Oral 2 times daily 05/05/22 1149 05/07/22 2142   05/04/22 1200  ceFEPIme (MAXIPIME) 2 g in sodium chloride 0.9 % 100 mL IVPB  Status:  Discontinued        2 g 200 mL/hr over 30 Minutes Intravenous Every 8 hours 05/04/22 0823 05/04/22 1001   05/04/22 1100  cefTRIAXone (ROCEPHIN) 2 g in sodium chloride 0.9 % 100 mL IVPB  Status:  Discontinued        2 g 200 mL/hr over 30 Minutes Intravenous Every 24 hours 05/04/22 1001 05/05/22 1147   05/02/22 1400  vancomycin (VANCOCIN) IVPB 1000 mg/200 mL premix  Status:  Discontinued        1,000 mg 200 mL/hr over 60 Minutes Intravenous Every 24 hours 05/02/22 1309 05/05/22 1147   05/02/22 1230  ceFEPIme (MAXIPIME) 2 g in sodium chloride 0.9 % 100 mL IVPB  Status:  Discontinued        2 g 200 mL/hr over 30 Minutes Intravenous Every 12 hours 05/02/22 1141 05/04/22 0823   05/02/22 1000  vancomycin (VANCOREADY) IVPB 1500 mg/300 mL  Status:  Discontinued        1,500 mg 150 mL/hr over 120 Minutes Intravenous Every 36 hours 05/01/22 0055 05/01/22 1325   05/02/22 1000  fluconazole (DIFLUCAN) tablet 150 mg        150 mg Oral Daily 05/01/22 1125 05/07/22 1103   05/02/22 1000  cefTRIAXone (ROCEPHIN) 2 g in sodium chloride 0.9 % 100 mL IVPB  Status:  Discontinued        2 g 200 mL/hr over 30 Minutes Intravenous Every 24 hours 05/01/22 1325 05/02/22 1051   05/01/22 1415  azithromycin (ZITHROMAX) tablet 500 mg        500 mg Oral Daily 05/01/22 1325 05/05/22 0911   05/01/22 1000  ceFEPIme (MAXIPIME) 2 g in sodium chloride 0.9 % 100 mL IVPB  Status:  Discontinued        2 g 200 mL/hr over 30 Minutes Intravenous Every 12 hours 05/01/22 0055 05/01/22 1325   05/01/22 0030  fluconazole (DIFLUCAN) tablet 150 mg        150 mg Oral  Once  05/01/22 0027 05/01/22 0249   05/01/22 0000  rifaximin (XIFAXAN) tablet 550 mg        550 mg Oral 2 times daily 04/30/22 2346     04/30/22 2115  vancomycin (VANCOREADY) IVPB 2000 mg/400 mL        2,000 mg 200 mL/hr over 120 Minutes Intravenous  Once 04/30/22 2100 05/01/22 0053   04/30/22 2100  ceFEPIme (MAXIPIME) 2 g in sodium chloride 0.9 % 100 mL IVPB        2 g 200 mL/hr over 30 Minutes Intravenous  Once 04/30/22 2057 04/30/22 2310       Subjective: Complains of worsening pain in the right breast area.  No abdominal pain nausea or vomiting.  Objective: Vitals:   05/07/22 1256 05/07/22 1427 05/07/22 2002 05/08/22 0437  BP: (!) 113/55  (!) 112/59 111/64  Pulse: 80  86 81  Resp: 19 19 17 19   Temp:   98.9 F (37.2 C) 98.4 F (36.9 C)  TempSrc:   Oral Oral  SpO2: 97%  97% 94%  Weight:      Height:        Intake/Output Summary (Last 24 hours) at 05/08/2022 0921 Last data filed at 05/08/2022 0857 Gross per 24 hour  Intake 596.81 ml  Output 1800 ml  Net -1203.19 ml    Filed Weights   04/30/22 2038 05/01/22 0351  Weight: 112.5 kg 119.8 kg    Examination:  General appearance: Awake alert.  In no distress Resp: Clear to auscultation bilaterally.  Normal effort Erythema swelling tenderness to palpation noted over the right breast.  Indurated area noted inferiorly. Cardio: S1-S2 is normal regular.  No S3-S4.  No rubs murmurs or bruit GI: Abdomen is soft.  Nontender nondistended.  Bowel sounds are present normal.  No masses organomegaly No obvious focal neurological deficits.  Data Reviewed: I have personally reviewed following labs and imaging studies  CBC: Recent Labs  Lab 05/02/22 0855 05/03/22 0338 05/04/22 0427 05/05/22 0410 05/06/22 0417  WBC 25.1* 11.8* 12.7* 9.7 8.5  HGB 7.3* 6.8* 8.5* 7.4* 8.5*  HCT 23.7* 22.0* 26.0* 22.2* 26.5*  MCV 93.3 91.7 89.7 88.1 89.8  PLT 155 134* 138* 131* 138*    Basic Metabolic Panel: Recent Labs  Lab 05/04/22 0427  05/05/22 0410 05/06/22 0417 05/06/22 1140 05/07/22 0434 05/08/22 0403  NA 136 139 139  --  139 137  K 3.5 3.1* 3.3*  --  3.4* 4.2  CL 111 112* 112*  --  113* 109  CO2 18* 17* 19*  --  18* 19*  GLUCOSE 100* 92 77  --  115* 84  BUN 28* 28* 24*  --  21 17  CREATININE 1.14* 1.21* 1.12*  --  1.06* 0.93  CALCIUM 7.9* 7.6* 7.7*  --  7.5* 7.5*  MG  --   --   --  1.4* 2.0 2.0  PHOS  --   --   --   --  2.5 2.9      Recent Results (from the past 240 hour(s))  Blood Culture (routine x 2)     Status: Abnormal (Preliminary result)   Collection Time: 04/30/22  8:19 PM   Specimen: Left Antecubital; Blood  Result Value Ref Range Status   Specimen Description   Final    LEFT ANTECUBITAL BOTTLES DRAWN AEROBIC AND ANAEROBIC Performed at [facility], [address], [place], [place] [address]    Special Requests   Final    Blood Culture adequate volume Performed at [facility], [address], [place], [place] [address]    Culture  Setup Time   Final    GRAM NEGATIVE RODS AEROBIC BOTTLE ONLY CRITICAL RESULT CALLED TO, READ BACK BY AND VERIFIED WITH: PHARMD N. GLOGOVAC [id number] FCP    Culture (A)  Final    FLAVOBACTERIUM ODORATUM MULTI-DRUG RESISTANT ORGANISM Sent to [facility] for further susceptibility testing. Performed at [name], [address] [place], [place] [address]    Report Status PENDING  Incomplete   Organism ID, Bacteria FLAVOBACTERIUM ODORATUM  Final      Susceptibility   Flavobacterium odoratum - MIC*    CEFTAZIDIME >=64 RESISTANT Resistant     CIPROFLOXACIN 2 INTERMEDIATE Intermediate     GENTAMICIN >=16 RESISTANT Resistant     IMIPENEM >=16 RESISTANT Resistant     TRIMETH/SULFA 160 RESISTANT  Resistant     PIP/TAZO >=128 RESISTANT Resistant     * FLAVOBACTERIUM ODORATUM  Blood Culture ID Panel (Reflexed)     Status: None   Collection Time: 04/30/22  8:19 PM  Result Value Ref Range Status   Enterococcus faecalis  NOT DETECTED NOT DETECTED Final   Enterococcus Faecium NOT DETECTED NOT DETECTED Final   Listeria monocytogenes NOT DETECTED NOT DETECTED Final   Staphylococcus species NOT DETECTED NOT DETECTED Final   Staphylococcus aureus (BCID) NOT DETECTED NOT DETECTED Final   Staphylococcus epidermidis NOT DETECTED NOT DETECTED Final   Staphylococcus lugdunensis NOT DETECTED NOT DETECTED Final   Streptococcus species NOT DETECTED NOT DETECTED Final   Streptococcus agalactiae NOT DETECTED NOT DETECTED Final   Streptococcus pneumoniae NOT DETECTED NOT DETECTED Final   Streptococcus pyogenes NOT DETECTED NOT DETECTED Final   A.calcoaceticus-baumannii NOT DETECTED NOT DETECTED Final   Bacteroides fragilis NOT DETECTED NOT DETECTED Final   Enterobacterales NOT DETECTED NOT DETECTED Final   Enterobacter cloacae complex NOT DETECTED NOT DETECTED Final   Escherichia coli NOT DETECTED NOT DETECTED Final   Klebsiella aerogenes NOT DETECTED NOT DETECTED Final   Klebsiella oxytoca NOT DETECTED NOT DETECTED Final   Klebsiella pneumoniae NOT DETECTED NOT DETECTED Final   Proteus species NOT DETECTED NOT DETECTED Final   Salmonella species NOT DETECTED NOT DETECTED Final   Serratia marcescens NOT DETECTED NOT DETECTED Final   Haemophilus influenzae NOT DETECTED NOT DETECTED Final   Neisseria meningitidis NOT DETECTED NOT DETECTED Final   Pseudomonas aeruginosa NOT DETECTED NOT DETECTED Final   Stenotrophomonas maltophilia NOT DETECTED NOT DETECTED Final   Candida albicans NOT DETECTED NOT DETECTED Final   Candida auris NOT DETECTED NOT DETECTED Final   Candida glabrata NOT DETECTED NOT DETECTED Final   Candida krusei NOT DETECTED NOT DETECTED Final   Candida parapsilosis NOT DETECTED NOT DETECTED Final   Candida tropicalis NOT DETECTED NOT DETECTED Final   Cryptococcus neoformans/gattii NOT DETECTED NOT DETECTED Final    Comment: Performed at [facility], [address], [place], [place] [address]   Urine Culture (for pregnant, neutropenic or urologic patients or patients with an indwelling urinary catheter)     Status: Abnormal   Collection Time: 04/30/22 10:32 PM   Specimen: Urine, Clean Catch  Result Value Ref Range Status   Specimen Description   Final    URINE, CLEAN CATCH Performed at [facility], [address] [place], [place] [address]    Special Requests   Final    NONE Performed at [facility], [address], [place], [place] [address]    Culture >=100,000 COLONIES/mL KLEBSIELLA PNEUMONIAE (A)  Final   Report Status 05/04/2022 FINAL  Final   Organism ID, Bacteria KLEBSIELLA PNEUMONIAE (A)  Final      Susceptibility   Klebsiella pneumoniae - MIC*    AMPICILLIN >=32 RESISTANT Resistant     CEFAZOLIN <=4 SENSITIVE Sensitive     CEFEPIME <=0.12 SENSITIVE Sensitive     CEFTRIAXONE <=0.25 SENSITIVE Sensitive     CIPROFLOXACIN <=0.25 SENSITIVE Sensitive     GENTAMICIN <=1 SENSITIVE Sensitive     IMIPENEM <=0.25 SENSITIVE Sensitive     NITROFURANTOIN 64 INTERMEDIATE Intermediate     TRIMETH/SULFA <=20 SENSITIVE Sensitive     AMPICILLIN/SULBACTAM 4 SENSITIVE Sensitive     PIP/TAZO <=4 SENSITIVE Sensitive     * >=100,000 COLONIES/mL KLEBSIELLA PNEUMONIAE  MRSA Next Gen by PCR, Nasal     Status: None  Collection Time: 05/01/22  4:11 AM   Specimen: Nasal Mucosa; Nasal Swab  Result Value Ref Range Status   MRSA by PCR Next Gen NOT DETECTED NOT DETECTED Final    Comment: (NOTE) The GeneXpert MRSA Assay (FDA approved for NASAL specimens only), is one component of a comprehensive MRSA colonization surveillance program. It is not intended to diagnose MRSA infection nor to guide or monitor treatment for MRSA infections. Test performance is not FDA approved in patients less than 74 years old. Performed at [facility], [address], [place], [place] [address]   Blood Culture (routine x 2)     Status: None    Collection Time: 05/01/22 11:09 AM   Specimen: BLOOD LEFT ARM  Result Value Ref Range Status   Specimen Description   Final    BLOOD LEFT ARM Performed at [facility], [address] [place], [place] [address]    Special Requests   Final    AEROBIC BOTTLE ONLY Blood Culture adequate volume Performed at [facility], [address] [place], [place] [address]    Culture   Final    NO GROWTH 5 DAYS Performed at [facility], [address], [place], [place] [address]    Report Status 05/06/2022 FINAL  Final     Radiology Studies: No results found.  Scheduled Meds:  enoxaparin (LOVENOX) injection  0.5 mg/kg Subcutaneous Q24H   furosemide  20 mg Intravenous Q12H   ketorolac  1 drop Left Eye QID   lactulose  10 g Oral TID   levothyroxine  150 mcg Oral Q0600   midodrine  10 mg Oral TID WC   neomycin-polymyxin b-dexamethasone   Left Eye QID   nystatin  5 mL Oral QID   nystatin ointment   Topical BID   pantoprazole  40 mg Oral BID   potassium chloride  40 mEq Oral Daily   rifaximin  550 mg Oral BID   spironolactone  25 mg Oral Daily   venlafaxine XR  75 mg Oral Daily   Continuous Infusions:  albumin human 25 g (05/07/22 2151)      LOS: 8 days   Osvaldo Shipper, MD [facility] 05/08/2022, 9:21 AM

## 2022-05-08 NOTE — Progress Notes (Signed)
Mobility Specialist - Progress Note   05/08/22 1421  Mobility  Activity Transferred to/from [facility]  Level of Assistance Contact guard assist, steadying assist  Distance Ambulated (ft) 2 ft  Activity Response Tolerated well  Mobility Referral Yes  $Mobility charge 1 Mobility   Pt received in bed requesting to use BSC for BM. No complaints during transfer. NT aware. Instructed pt to push call bell when finished. Pt to [facility] after session with all needs met & call bell in reach.     [facility]

## 2022-05-08 NOTE — Progress Notes (Signed)
Mobility Specialist - Progress Note   05/08/22 1038  Mobility  Activity Transferred from bed to chair  Level of Assistance Standby assist, set-up cues, supervision of patient - no hands on  Assistive Device Front wheel walker  Distance Ambulated (ft) 2 ft  Activity Response Tolerated well  Mobility Referral Yes  $Mobility charge 1 Mobility   Pt received in bed agreeable to transfer to recliner. No complaints during transfer. Pt to recliner after session with all needs met.    [facility]

## 2022-05-09 DIAGNOSIS — N179 Acute kidney failure, unspecified: Secondary | ICD-10-CM | POA: Diagnosis not present

## 2022-05-09 DIAGNOSIS — R6520 Severe sepsis without septic shock: Secondary | ICD-10-CM

## 2022-05-09 DIAGNOSIS — N61 Mastitis without abscess: Secondary | ICD-10-CM | POA: Diagnosis not present

## 2022-05-09 DIAGNOSIS — A419 Sepsis, unspecified organism: Secondary | ICD-10-CM | POA: Diagnosis not present

## 2022-05-09 DIAGNOSIS — I890 Lymphedema, not elsewhere classified: Secondary | ICD-10-CM

## 2022-05-09 DIAGNOSIS — D649 Anemia, unspecified: Secondary | ICD-10-CM

## 2022-05-09 DIAGNOSIS — K7469 Other cirrhosis of liver: Secondary | ICD-10-CM | POA: Diagnosis not present

## 2022-05-09 DIAGNOSIS — L03313 Cellulitis of chest wall: Secondary | ICD-10-CM | POA: Diagnosis not present

## 2022-05-09 MED ORDER — SODIUM CHLORIDE 0.9 % IV SOLN: 2.0000 g | Freq: Three times a day (TID) | INTRAVENOUS | Status: DC

## 2022-05-09 MED ORDER — TRIAMCINOLONE ACETONIDE 0.5 % EX CREA
TOPICAL_CREAM | Freq: Two times a day (BID) | CUTANEOUS | Status: DC
  Filled 2022-05-09 (×2): qty 15

## 2022-05-09 MED ORDER — LINEZOLID 600 MG PO TABS
600.0000 mg | ORAL_TABLET | Freq: Two times a day (BID) | ORAL | Status: DC
  Administered 2022-05-09 – 2022-05-10 (×3): 600 mg via ORAL
  Filled 2022-05-09 (×4): qty 1

## 2022-05-09 NOTE — Progress Notes (Signed)
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  Became febrile up to 101 F at home, little confused to her son.Pt also has yeast infection beneath left breast and all around her groin/vulvarvaginal region. Dx of septic shock, cellulitis, cirrhosis.    PT Comments    Pt agreeable to working with therapy. Son present to observe and encourage pt. Pt participated well. She was overall Min guard A for mobility on today. Will continue to progress activity as tolerated.    Recommendations for follow up therapy are one component of a multi-disciplinary discharge planning process, led by the attending physician.  Recommendations may be updated based on patient status, additional functional criteria and insurance authorization.  Follow Up Recommendations       Assistance Recommended at Discharge Intermittent Supervision/Assistance  Patient can return home with the following A little help with walking and/or transfers;A little help with bathing/dressing/bathroom;Assistance with cooking/housework;Assist for transportation;Help with stairs or ramp for entrance   Equipment Recommendations       Recommendations for Other Services       Precautions / Restrictions Precautions Precautions: Fall Restrictions Weight Bearing Restrictions: No     Mobility  Bed Mobility Overal bed mobility: Needs Assistance Bed Mobility: Sit to Supine       Sit to supine: Modified independent (Device/Increase time)        Transfers Overall transfer level: Needs assistance Equipment used: Rolling walker (2  wheels) Transfers: Sit to/from Stand Sit to Stand: Min guard           General transfer comment: Min guard for safety. Increased time. Cues provided.    Ambulation/Gait Ambulation/Gait assistance: Min guard Gait Distance (Feet): 20 Feet (x2) Assistive device: Rolling walker (2 wheels) Gait Pattern/deviations: Step-through pattern, Decreased stride length       General Gait Details: Min guard A. Pt walked across rom and back x 2 with seated rest break between walks. O2 94% on RA, HR 65 bpm.   Stairs             Wheelchair Mobility    Modified Rankin (Stroke Patients Only)       Balance Overall balance assessment: Needs assistance         Standing balance support: Bilateral upper extremity supported, Reliant on assistive device for balance, During functional activity Standing balance-Leahy Scale: Poor                              Cognition Arousal/Alertness: Awake/alert Behavior During Therapy: WFL for tasks assessed/performed Overall Cognitive Status: Within Functional Limits for tasks assessed                                          Exercises      General Comments        Pertinent Vitals/Pain Pain Assessment Pain Assessment: Faces Faces Pain Scale: Hurts little more Pain Location: R breast/armpit area Pain Descriptors / Indicators: Burning, Sore Pain Intervention(s): Monitored during session, Repositioned    Home Living  Prior Function            PT Goals (current goals can now be found in the care plan section) Progress towards PT goals: Progressing toward goals    Frequency    Min 1X/week      PT Plan Current plan remains appropriate    Co-evaluation              AM-PAC PT "6 Clicks" Mobility   Outcome Measure  Help needed turning from your back to your side while in a flat bed without using bedrails?: A Little Help needed moving from lying on your  back to sitting on the side of a flat bed without using bedrails?: A Little Help needed moving to and from a bed to a chair (including a wheelchair)?: A Little Help needed standing up from a chair using your arms (e.g., wheelchair or bedside chair)?: A Little Help needed to walk in hospital room?: A Little Help needed climbing 3-5 steps with a railing? : A Lot 6 Click Score: 17    End of Session   Activity Tolerance: Patient tolerated treatment well;Patient limited by fatigue Patient left: in bed;with call bell/phone within reach;with bed alarm set;with family/visitor present   PT Visit Diagnosis: Difficulty in walking, not elsewhere classified (R26.2)     Time: 1610-9604 PT Time Calculation (min) (ACUTE ONLY): 18 min  Charges:  $Gait Training: 8-22 mins                        Faye Ramsay, PT Acute Rehabilitation  Office: [phone]

## 2022-05-09 NOTE — Progress Notes (Addendum)
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  Became febrile up to 101 F at home, little confused to her son. Has been dealing with yeast infection beneath left breast and all around her groin/vulvarvaginal region. Just started doing nystatin powder.  On presentation she was hypotensive, tachycardic.  Lab work showed WC count of 28,000, elevated lactic acid level up to the range of 5.  Chest x-ray did not show any pneumonia.  CT chest/abdomen/pelvis showed trace bilateral pleural effusion, atelectasis of left lower lobe but pneumonia not excluded.  Found to have edema/erythema around right breast.  Patient was admitted for the management of septic shock likely from cellulitis.  Started on broad spectrum antibiotics, antifungal .  Hospital course remarkable for  hepatic encephalopathy, anemia with positive fecal blood.  GI consulted now signed off.   Assessment & Plan:  Cellulitis involving right breast/septic shock Presented with fever, hypotension, tachycardia, lactic acidosis,leucocytosis.  Likely from cellulitis but chest imaging could not rule out left lower lobe infiltrate.  She was initially started on broad-spectrum antibiotics.  Urine cultures grew Klebsiella. 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  She was also started on antifungal.  After discussions with ID she was changed over to cefadroxil.  She had completed course of antibiotics. However over the last 2 days she mentions feeling worse.  The swelling in the right breast area had worsened and she was experiencing more pain.   Antibiotics were reinitiated.  She is now on cefepime. Imaging studies have been ordered to rule out abscess.  An ultrasound was done after discussions with radiology.  Results are pending. Continue midodrine.  Blood pressure is reasonably well-controlled.  Liver cirrhosis/NASH/anasarca/hypokalemia/hypoalbuminemia Has edema/anasarca. Takes Lasix, spironolactone at home.  Currently on Lasix 20 mg IV twice daily.   Ammonia level was high, started on lactulose. On rifaximin, restarted.  She follows with Dr Nedra Hai at [facility].  Needs to be stable for the most part.  Ammonia level had improved to 29.  Normocytic anemia/thrombocytopenia Hemoglobin dropped to 6 on 4/23.  Patient has chronic iron deficiency, iron level very low.  Gets regular iron infusion.  Denies any hematochezia or melena.  Positive FOBT.  Given iron infusion on 4/22.   GI consulted: Recommended to monitor and continue Protonix.  No plan for intervention for now.   Hemoglobin is stable. Also has mild thrombocytopenia: Most likely secondary to history of cirrhosis.  Bleeding from the vulvar area She is noted to have some mild skin tears/mucosal tears in her vulvar region.  Pure wick to be discontinued.  No profuse bleeding noted.  Continue to monitor for now.  Recent history of eye surgery Blind in left eye with recent gunderson flap surgery on 4/19 with [facility] ophthalmology.  Continue antibiotic ophthalmic ointment, ketorolac eye drop  Itchy rash Complained of intense itchy rash on her back.  Noticed to have erythematous rash on the back.  Local ointment/oral Benadryl not helping.  Started on prednisone with improvement .  But patient became more confused show prednisone discontinued.  Ordered Atarax.  Eating better  AKI Creatinine of 1.5 on presentation.  Resolved. Monitor urine output.  Avoid nephrotoxic agents.  Hypokalemia:  Periodically and supplement as indicated.  Hyperlipidemia On Lipitor  History of gout On  colchicine as needed.  Hypothyroidism On Synthyroid  History of depression Venlafaxine.  Morbid obesity Estimated body mass index is 48.31 kg/m as calculated from the following:   Height as of this encounter: 5\' 2"  (1.575 m).   Weight as of this encounter: 119.8 kg.   DVT prophylaxis:Lovenox Code Status: Full Code Family Communication: No family at bedside today.  Will reach out to son later today. Disposition: Home health recommended by physical therapy    Consultants: GI  Procedures:None  Antimicrobials:  Anti-infectives (From admission, onward)    Start     Dose/Rate Route Frequency Ordered Stop   05/08/22 1030  ceFEPIme (MAXIPIME) 2 g in sodium chloride 0.9 % 100 mL IVPB        2 g 200 mL/hr over 30 Minutes Intravenous Every 12 hours 05/08/22 0933     05/05/22 1245  cefadroxil (DURICEF) capsule 500 mg        500 mg Oral 2 times daily 05/05/22 1149 05/07/22 2142   05/04/22 1200  ceFEPIme (MAXIPIME) 2 g in sodium chloride 0.9 % 100 mL IVPB  Status:  Discontinued        2 g 200 mL/hr over 30 Minutes Intravenous Every 8 hours 05/04/22 0823 05/04/22 1001   05/04/22 1100  cefTRIAXone (ROCEPHIN) 2 g in sodium chloride 0.9 % 100 mL IVPB  Status:  Discontinued        2 g 200 mL/hr over 30 Minutes Intravenous Every 24 hours 05/04/22 1001 05/05/22 1147   05/02/22 1400  vancomycin (VANCOCIN) IVPB 1000 mg/200 mL premix  Status:  Discontinued        1,000 mg 200 mL/hr over 60 Minutes Intravenous Every 24 hours 05/02/22 1309 05/05/22 1147   05/02/22 1230  ceFEPIme (MAXIPIME) 2 g in sodium chloride 0.9 % 100 mL IVPB  Status:  Discontinued        2 g 200 mL/hr over 30 Minutes Intravenous Every 12 hours 05/02/22 1141 05/04/22 0823   05/02/22 1000  vancomycin (VANCOREADY) IVPB 1500 mg/300 mL  Status:  Discontinued        1,500 mg 150 mL/hr over 120 Minutes Intravenous Every 36 hours 05/01/22 0055 05/01/22 1325   05/02/22 1000  fluconazole (DIFLUCAN) tablet 150 mg        150 mg  Oral Daily 05/01/22 1125 05/07/22 1103   05/02/22 1000  cefTRIAXone (ROCEPHIN) 2 g in sodium chloride 0.9 % 100 mL IVPB  Status:  Discontinued        2 g 200 mL/hr over 30 Minutes Intravenous Every 24 hours 05/01/22 1325 05/02/22 1051   05/01/22 1415  azithromycin (ZITHROMAX) tablet 500 mg        500 mg Oral Daily 05/01/22 1325 05/05/22 0911   05/01/22 1000  ceFEPIme (MAXIPIME) 2 g in sodium chloride 0.9 % 100 mL IVPB  Status:  Discontinued        2 g 200 mL/hr over 30 Minutes Intravenous Every 12 hours 05/01/22 0055 05/01/22 1325   05/01/22 0030  fluconazole (DIFLUCAN) tablet 150 mg        150 mg Oral  Once 05/01/22 0027 05/01/22 0249   05/01/22 0000  rifaximin (XIFAXAN) tablet 550 mg        550 mg Oral 2 times daily 04/30/22 2346     04/30/22 2115  vancomycin (VANCOREADY) IVPB 2000 mg/400 mL  2,000 mg 200 mL/hr over 120 Minutes Intravenous  Once 04/30/22 2100 05/01/22 0053   04/30/22 2100  ceFEPIme (MAXIPIME) 2 g in sodium chloride 0.9 % 100 mL IVPB        2 g 200 mL/hr over 30 Minutes Intravenous  Once 04/30/22 2057 04/30/22 2310       Subjective: Continues to have pain in the right breast area.  Denies any nausea vomiting abdominal pain.  Objective: Vitals:   05/08/22 1436 05/08/22 1614 05/08/22 2018 05/09/22 0619  BP: (!) 107/59 (!) 112/58 (!) 111/54 (!) 114/52  Pulse: 82 81 82 80  Resp: 17  16 18   Temp: 97.9 F (36.6 C)  98.2 F (36.8 C) 98.5 F (36.9 C)  TempSrc: Oral  Oral Oral  SpO2: 99%  97% 96%  Weight:      Height:        Intake/Output Summary (Last 24 hours) at 05/09/2022 0849 Last data filed at 05/09/2022 0800 Gross per 24 hour  Intake 435.38 ml  Output 2202 ml  Net -1766.62 ml    Filed Weights   04/30/22 2038 05/01/22 0351  Weight: 112.5 kg 119.8 kg    Examination:  General appearance: Awake alert.  In no distress Resp: Clear to auscultation bilaterally.  Normal effort Right breast continues to be swollen, tender to palpate, erythematous,  indurated area noted inferiorly. Cardio: S1-S2 is normal regular.  No S3-S4.  No rubs murmurs or bruit GI: Abdomen is soft.  Nontender nondistended.  Bowel sounds are present normal.  No masses organomegaly Extremities: mild edema bilateral lower extremities  Data Reviewed: I have personally reviewed following labs and imaging studies  CBC: Recent Labs  Lab 05/03/22 0338 05/04/22 0427 05/05/22 0410 05/06/22 0417 05/08/22 1104  WBC 11.8* 12.7* 9.7 8.5 8.2  HGB 6.8* 8.5* 7.4* 8.5* 8.7*  HCT 22.0* 26.0* 22.2* 26.5* 28.2*  MCV 91.7 89.7 88.1 89.8 95.6  PLT 134* 138* 131* 138* 118*    Basic Metabolic Panel: Recent Labs  Lab 05/04/22 0427 05/05/22 0410 05/06/22 0417 05/06/22 1140 05/07/22 0434 05/08/22 0403  NA 136 139 139  --  139 137  K 3.5 3.1* 3.3*  --  3.4* 4.2  CL 111 112* 112*  --  113* 109  CO2 18* 17* 19*  --  18* 19*  GLUCOSE 100* 92 77  --  115* 84  BUN 28* 28* 24*  --  21 17  CREATININE 1.14* 1.21* 1.12*  --  1.06* 0.93  CALCIUM 7.9* 7.6* 7.7*  --  7.5* 7.5*  MG  --   --   --  1.4* 2.0 2.0  PHOS  --   --   --   --  2.5 2.9      Recent Results (from the past 240 hour(s))  Blood Culture (routine x 2)     Status: Abnormal (Preliminary result)   Collection Time: 04/30/22  8:19 PM   Specimen: Left Antecubital; Blood  Result Value Ref Range Status   Specimen Description   Final    LEFT ANTECUBITAL BOTTLES DRAWN AEROBIC AND ANAEROBIC Performed at [facility], [address] [place], [place] [address]    Special Requests   Final    Blood Culture adequate volume Performed at [facility], [address], [place], [place] [address]    Culture  Setup Time   Final    GRAM NEGATIVE RODS AEROBIC BOTTLE ONLY CRITICAL RESULT CALLED TO, READ BACK BY AND VERIFIED WITH:  PHARMD N. GLOGOVAC [id number] FCP    Culture (A)  Final    FLAVOBACTERIUM ODORATUM MULTI-DRUG RESISTANT ORGANISM Sent to [facility] for further susceptibility  testing. Performed at [facility], [address] [place], [place] [address]    Report Status PENDING  Incomplete   Organism ID, Bacteria FLAVOBACTERIUM ODORATUM  Final      Susceptibility   Flavobacterium odoratum - MIC*    CEFTAZIDIME >=64 RESISTANT Resistant     CIPROFLOXACIN 2 INTERMEDIATE Intermediate     GENTAMICIN >=16 RESISTANT Resistant     IMIPENEM >=16 RESISTANT Resistant     TRIMETH/SULFA 160 RESISTANT Resistant     PIP/TAZO >=128 RESISTANT Resistant     * FLAVOBACTERIUM ODORATUM  Blood Culture ID Panel (Reflexed)     Status: None   Collection Time: 04/30/22  8:19 PM  Result Value Ref Range Status   Enterococcus faecalis NOT DETECTED NOT DETECTED Final   Enterococcus Faecium NOT DETECTED NOT DETECTED Final   Listeria monocytogenes NOT DETECTED NOT DETECTED Final   Staphylococcus species NOT DETECTED NOT DETECTED Final   Staphylococcus aureus (BCID) NOT DETECTED NOT DETECTED Final   Staphylococcus epidermidis NOT DETECTED NOT DETECTED Final   Staphylococcus lugdunensis NOT DETECTED NOT DETECTED Final   Streptococcus species NOT DETECTED NOT DETECTED Final   Streptococcus agalactiae NOT DETECTED NOT DETECTED Final   Streptococcus pneumoniae NOT DETECTED NOT DETECTED Final   Streptococcus pyogenes NOT DETECTED NOT DETECTED Final   A.calcoaceticus-baumannii NOT DETECTED NOT DETECTED Final   Bacteroides fragilis NOT DETECTED NOT DETECTED Final   Enterobacterales NOT DETECTED NOT DETECTED Final   Enterobacter cloacae complex NOT DETECTED NOT DETECTED Final   Escherichia coli NOT DETECTED NOT DETECTED Final   Klebsiella aerogenes NOT DETECTED NOT DETECTED Final   Klebsiella oxytoca NOT DETECTED NOT DETECTED Final   Klebsiella pneumoniae NOT DETECTED NOT DETECTED Final   Proteus species NOT DETECTED NOT DETECTED Final   Salmonella species NOT DETECTED NOT DETECTED Final   Serratia marcescens NOT DETECTED NOT DETECTED Final   Haemophilus influenzae NOT DETECTED NOT  DETECTED Final   Neisseria meningitidis NOT DETECTED NOT DETECTED Final   Pseudomonas aeruginosa NOT DETECTED NOT DETECTED Final   Stenotrophomonas maltophilia NOT DETECTED NOT DETECTED Final   Candida albicans NOT DETECTED NOT DETECTED Final   Candida auris NOT DETECTED NOT DETECTED Final   Candida glabrata NOT DETECTED NOT DETECTED Final   Candida krusei NOT DETECTED NOT DETECTED Final   Candida parapsilosis NOT DETECTED NOT DETECTED Final   Candida tropicalis NOT DETECTED NOT DETECTED Final   Cryptococcus neoformans/gattii NOT DETECTED NOT DETECTED Final    Comment: Performed at [facility], [address] [place], [place] [address]  Urine Culture (for pregnant, neutropenic or urologic patients or patients with an indwelling urinary catheter)     Status: Abnormal   Collection Time: 04/30/22 10:32 PM   Specimen: Urine, Clean Catch  Result Value Ref Range Status   Specimen Description   Final    URINE, CLEAN CATCH Performed at [facility], [address] [place], [place] [address]    Special Requests   Final    NONE Performed at [facility], [address] [place], [place] [address]    Culture >=100,000 COLONIES/mL KLEBSIELLA PNEUMONIAE (A)  Final   Report Status 05/04/2022 FINAL  Final   Organism ID, Bacteria KLEBSIELLA PNEUMONIAE (A)  Final      Susceptibility   Klebsiella pneumoniae - MIC*  AMPICILLIN >=32 RESISTANT Resistant     CEFAZOLIN <=4 SENSITIVE Sensitive     CEFEPIME <=0.12 SENSITIVE Sensitive     CEFTRIAXONE <=0.25 SENSITIVE Sensitive     CIPROFLOXACIN <=0.25 SENSITIVE Sensitive     GENTAMICIN <=1 SENSITIVE Sensitive     IMIPENEM <=0.25 SENSITIVE Sensitive     NITROFURANTOIN 64 INTERMEDIATE Intermediate     TRIMETH/SULFA <=20 SENSITIVE Sensitive     AMPICILLIN/SULBACTAM 4 SENSITIVE Sensitive     PIP/TAZO <=4 SENSITIVE Sensitive     * >=100,000 COLONIES/mL KLEBSIELLA PNEUMONIAE  MRSA Next Gen by PCR, Nasal      Status: None   Collection Time: 05/01/22  4:11 AM   Specimen: Nasal Mucosa; Nasal Swab  Result Value Ref Range Status   MRSA by PCR Next Gen NOT DETECTED NOT DETECTED Final    Comment: (NOTE) The GeneXpert MRSA Assay (FDA approved for NASAL specimens only), is one component of a comprehensive MRSA colonization surveillance program. It is not intended to diagnose MRSA infection nor to guide or monitor treatment for MRSA infections. Test performance is not FDA approved in patients less than 40 years old. Performed at [facility], [address], [place], [place] [address]   Blood Culture (routine x 2)     Status: None   Collection Time: 05/01/22 11:09 AM   Specimen: BLOOD LEFT ARM  Result Value Ref Range Status   Specimen Description   Final    BLOOD LEFT ARM Performed at [facility], [address] [place], [place] [address]    Special Requests   Final    AEROBIC BOTTLE ONLY Blood Culture adequate volume Performed at [facility], [address], [place], [place] [address]    Culture   Final    NO GROWTH 5 DAYS Performed at [facility], [address] [place], [place] [address]    Report Status 05/06/2022 FINAL  Final     Radiology Studies: No results found.  Scheduled Meds:  [START ON 05/10/2022] enoxaparin (LOVENOX) injection  0.5 mg/kg Subcutaneous Q24H   furosemide  20 mg Intravenous Q12H   ketorolac  1 drop Left Eye QID   lactulose  10 g Oral TID   levothyroxine  150 mcg Oral Q0600   midodrine  10 mg Oral TID WC   neomycin-polymyxin b-dexamethasone   Left Eye QID   nystatin ointment   Topical BID   pantoprazole  40 mg Oral BID   potassium chloride  40 mEq Oral Daily   rifaximin  550 mg Oral BID   spironolactone  25 mg Oral Daily   venlafaxine XR  75 mg Oral Daily   Continuous Infusions:  albumin human 25 g (05/09/22 0102)   ceFEPime (MAXIPIME) IV 2 g (05/08/22 2236)      LOS: 9 days   Osvaldo Shipper,  MD [facility] 05/09/2022, 8:49 AM

## 2022-05-09 NOTE — Progress Notes (Signed)
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  Became febrile up to 101 F at home, little confused to her son.Pt also has yeast infection beneath left breast and all around her groin/vulvarvaginal region. Dx of septic shock, cellulitis, cirrhosis.   OT comments  Pt making progress with functional goals. Pt sat EOb with Sup, no physical assist with increased time. Pt stood form EOB with RW to SPT to [facility]. Pt managed clothing and completed anterior and posterior hygiene with min guard A. Pt washed and dried her hands standing at RW with min guard A. Pt took 2 steps to transfer to recliner min guard A. Pt brushed hair seated with set up. Pt very pleasant and cooperative. OT will contiue to follow acutely to maximize level of function and safety   Recommendations for follow up therapy are one component of a multi-disciplinary discharge planning process, led by the attending physician.  Recommendations may be updated based on patient status, additional functional criteria and insurance authorization.    Assistance Recommended at Discharge Intermittent Supervision/Assistance  Patient can return home with the following  A little help with walking and/or transfers;A little help with bathing/dressing/bathroom;Assistance with cooking/housework;Assist for transportation;Help with stairs or ramp for entrance;Direct supervision/assist for medications management;Direct supervision/assist for financial management   Equipment Recommendations  None recommended by OT    Recommendations for Other Services       Precautions / Restrictions Precautions Precautions: Fall Restrictions Weight Bearing Restrictions: No       Mobility Bed Mobility Overal bed mobility: Needs Assistance Bed Mobility: Supine to Sit     Supine to sit: Supervision, HOB elevated     General bed mobility comments: increased time    Transfers Overall transfer level: Needs assistance Equipment used: Rolling walker (2 wheels) Transfers: Sit to/from Stand, Bed to chair/wheelchair/BSC Sit to Stand: Min guard, From elevated surface     Step pivot transfers: Min guard     General transfer comment: bed to [facility] to RW to transfer to recliner     Balance Overall balance assessment: Needs assistance Sitting-balance support: Feet supported, No upper extremity supported Sitting balance-Leahy Scale: Good     Standing balance support: Bilateral upper extremity supported, Reliant on assistive device for balance, During functional activity Standing balance-Leahy Scale: Poor                             ADL either performed or assessed with clinical judgement   ADL Overall ADL's : Needs assistance/impaired     Grooming: Wash/dry hands;Wash/dry face;Min guard;Standing;Brushing hair Grooming Details (indicate cue type and reason): standing at [facility] with RW                 Toilet Transfer: Min guard;Stand-pivot;BSC/3in1   Toileting- Architect and Hygiene: Min guard;Sit to/from stand Toileting - Clothing Manipulation Details (indicate cue type and reason): clothing mgt and hygiene     Functional mobility during ADLs: Min guard;Rolling walker (2 wheels)      Extremity/Trunk Assessment Upper Extremity Assessment Upper Extremity Assessment: Overall WFL for tasks assessed   Lower Extremity Assessment Lower Extremity Assessment: Defer to PT  evaluation   Cervical / Trunk Assessment Cervical / Trunk Assessment: Normal    Vision Baseline Vision/History:  (L eye blindness) Patient Visual  Report: No change from baseline     Perception     Praxis      Cognition Arousal/Alertness: Awake/alert Behavior During Therapy: WFL for tasks assessed/performed Overall Cognitive Status: Within Functional Limits for tasks assessed                                          Exercises      Shoulder Instructions       General Comments      Pertinent Vitals/ Pain       Pain Assessment Pain Assessment: 0-10 Pain Score: 3  Pain Location: L breast rash area Pain Descriptors / Indicators: Burning, Sore Pain Intervention(s): Monitored during session, Repositioned  Home Living                                          Prior Functioning/Environment              Frequency  Min 2X/week        Progress Toward Goals  OT Goals(current goals can now be found in the care plan section)  Progress towards OT goals: Progressing toward goals     Plan Discharge plan remains appropriate    Co-evaluation                 AM-PAC OT "6 Clicks" Daily Activity     Outcome Measure   Help from another person eating meals?: None Help from another person taking care of personal grooming?: A Little Help from another person toileting, which includes using toliet, bedpan, or urinal?: A Little Help from another person bathing (including washing, rinsing, drying)?: A Little Help from another person to put on and taking off regular upper body clothing?: A Little Help from another person to put on and taking off regular lower body clothing?: A Little 6 Click Score: 19    End of Session Equipment Utilized During Treatment: Rolling walker (2 wheels);Other (comment) (BSC)  OT Visit Diagnosis: Unsteadiness on feet (R26.81);Muscle weakness (generalized) (M62.81);Pain Pain - Right/Left: Left Pain - part of body:  (rash area at L breast)   Activity Tolerance Patient tolerated treatment well   Patient Left with call bell/phone within reach;in  chair;with chair alarm set   Nurse Communication Mobility status        Time: 1610-9604 OT Time Calculation (min): 21 min  Charges: OT General Charges $OT Visit: 1 Visit OT Treatments $Self Care/Home Management : 8-22 mins   Galen Manila 05/09/2022, 12:58 PM

## 2022-05-09 NOTE — Progress Notes (Addendum)
Pharmacy Antibiotic Note  [name] is a 67 y.o. female admitted on 04/30/2022 with sepsis / R breast cellulitis. Has received a full course of abx but now with worsening pain and swelling there is concern for worsening cellulitis and need to rule out abscess. Pharmacy consulted for Cefepime dosing on 4/28.  4/28 [place]: findings consistent with mastitis, breast edema/inflammation. No evidence of abscess.   Plan: Adjust Cefepime to 2g IV q8h based on current renal function Monitor renal function, cultures, clinical picture, LOT   Height: 5\' 2"  (157.5 cm) Weight: 119.8 kg (264 lb 1.8 oz) IBW/kg (Calculated) : 50.1  Temp (24hrs), Avg:98.2 F (36.8 C), Min:97.9 F (36.6 C), Max:98.5 F (36.9 C)  Recent Labs  Lab 05/03/22 0338 05/04/22 0427 05/05/22 0410 05/06/22 0417 05/07/22 0434 05/08/22 0403 05/08/22 1104  WBC 11.8* 12.7* 9.7 8.5  --   --  8.2  CREATININE 1.27* 1.14* 1.21* 1.12* 1.06* 0.93  --   LATICACIDVEN 2.4*  --   --   --   --   --   --      Estimated Creatinine Clearance: 75.3 mL/min (by C-G formula based on SCr of 0.93 mg/dL).    Allergies  Allergen Reactions   Ivp Dye [Iodinated Contrast Media] Anaphylaxis   Ace Inhibitors Swelling   Chlorhexidine     Pt has a rash, & made irritation skin further   Doxycycline Rash   Penicillins Rash    Did it involve swelling of the face/tongue/throat, SOB, or low BP? No Did it involve sudden or severe rash/hives, skin peeling, or any reaction on the inside of your mouth or nose? Yes Did you need to seek medical attention at a hospital or doctor's office? No When did it last happen? Several Years Ago      If all above answers are "NO", may proceed with cephalosporin use.     Zofran [Ondansetron] Rash     Thank you for allowing pharmacy to be a part of this patient's care.  Greer Pickerel, PharmD, BCPS Clinical Pharmacist 05/09/2022 1:31 PM

## 2022-05-09 NOTE — Consult Note (Signed)
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  Per her son/patient she has had these for at least several months. Prior right breast biopsy and given dx lymphedema Current swelling/redness episode isolated without any hepatic encephalopathy, fever/hypothermia, or sign of sepsis otherwise Also she basically hasn't been off abx since admission --> it would be unusual for cellulitis to behave this way (got worse on abx that was helping its improvement). Ultrasound 4/28 demonstrate no abscess. Clinical exam also doesn't appear to suggest abscess  I suspect lymphedema/venous stasis with dermatitis playing a major role as well  Of note, we asked for flavobacterium susceptibility testing which was done 4/22 and that is in process. In case she has recurrent bacteremia of same and sign of true sepsis due to that  -given initial cellulitis, let's change abx to linezolid alone for 7 more days until 5/05 (less cdiff inducing potential) -will also start topical prednisone cream for 10 days -continue topical antifungal chronically -she is already on diuretics for cirrhosis/ascites -discussed with primary team   I  spent more than 80 minute reviewing data/chart, and coordinating care, providing direct face to face time providing counseling/discussing diagnostics/treatment plan with patient and treatment team      ------------------------------------------------ Principal Problem:   Sepsis (HCC) Active Problems:   Hypotension   Liver cirrhosis secondary to NASH (HCC)   AKI (acute kidney injury) (HCC)   Candidal skin infection   History of eye surgery   Obesity, Class III, BMI 40-49.9 (morbid obesity) (HCC)    HPI: [name] is a 67 y.o. female nash cirrhosis, pe/dvt no longer on anticoagulation, hx multiple abd surgeries admitted initially 4/20 with severe sepsis/shock unclear source vs cellulitis in setting obesity and lymphedema associated chronic/recurrent intertrigo, recovered, but with intermittent right breast redness/swelling again   I reviewed chart Son is by bedside and reviewed history with him and with patient  Patient initially admitted 4/20 for sepsis and severe progressive redness of bilateral intertrigo breast and right breast redness She improved on empiric antibiotics. Leukocytosis and hepatic encephalopathy resolved  She transitioned to cefadroxil to finish tx for the cellulitis  2 days ago (hd #7) her right breast had become red and enlarged again An ultrasound done showed no abscess but sign of soft tissue swelling/edema Her abx was escalated from cefadroxil to cefepime  She has had no new fever/hypothermia/leukocytosis/worsening hepatic encephalopathy  No dyspnea/cough Chronic loose stool  No n/v No other rash No focal pain otherwise     History reviewed. No pertinent family history.  Social History   Tobacco Use   Smoking status: Former    Types: Cigarettes    Quit date: 1987    Years since quitting: 37.3    Passive exposure: Past  Smokeless tobacco: Never  Vaping Use   Vaping Use: Never used  Substance Use Topics   Alcohol use: No   Drug  use: No    Allergies  Allergen Reactions   Ivp Dye [Iodinated Contrast Media] Anaphylaxis   Ace Inhibitors Swelling   Chlorhexidine     Pt has a rash, & made irritation skin further   Doxycycline Rash   Penicillins Rash    Did it involve swelling of the face/tongue/throat, SOB, or low BP? No Did it involve sudden or severe rash/hives, skin peeling, or any reaction on the inside of your mouth or nose? Yes Did you need to seek medical attention at a hospital or doctor's office? No When did it last happen? Several Years Ago      If all above answers are "NO", may proceed with cephalosporin use.     Zofran [Ondansetron] Rash    Review of Systems: ROS All Other ROS was negative, except mentioned above   Past Medical History:  Diagnosis Date   Chronic diarrhea    Cirrhosis, 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   pt stated on admission history review   Heart murmur    Hypertension    Kidney stone    Obesity    Pulmonary embolism (HCC)    Short gut syndrome    Thyroid disease        Scheduled Meds:  [START ON 05/10/2022] enoxaparin (LOVENOX) injection  0.5 mg/kg Subcutaneous Q24H   furosemide  20 mg Intravenous Q12H   ketorolac  1 drop Left Eye QID   lactulose  10 g Oral TID   levothyroxine  150 mcg Oral Q0600   midodrine  10 mg Oral TID WC   neomycin-polymyxin b-dexamethasone   Left Eye QID   nystatin ointment   Topical BID   pantoprazole  40 mg Oral BID   potassium chloride  40 mEq Oral Daily   rifaximin  550 mg Oral BID   spironolactone  25 mg Oral Daily   venlafaxine XR  75 mg Oral Daily   Continuous Infusions:  albumin human 25 g (05/09/22 0102)   ceFEPime (MAXIPIME) IV     PRN Meds:.hydrocortisone cream, hydrOXYzine, lip balm, mouth rinse, oxyCODONE   OBJECTIVE: Blood pressure 120/75, pulse 85, temperature 98 F (36.7 C), temperature source Oral, resp. rate (!) 24, height 5\' 2"  (1.575 m), weight 119.8 kg, SpO2 100 %.  Physical  Exam  General/constitutional: no distress, pleasant, obese, conversant; pursed lips breathing at times chronic HEENT: Normocephalic; left conj/cornea scarring (recent surgery) and slight conj erythema; EOMI; nontender and no swelling periorbital bilaterally Neck supple CV: rrr no mrg Lungs: clear to auscultation, normal respiratory effort Abd: Soft, Nontender Ext: truncal edema with minimal trace bilateral LE edema Skin: intertrigo erythema left breast/abd panus; right breast slightly more enlarged, erythematous, no fluctuance, mild tenderness on palpation Neuro: nonfocal MSK: no peripheral joint swelling/tenderness/warmth; back spines nontender   Lab Results Lab Results  Component Value Date   WBC 8.2 05/08/2022   HGB 8.7 (L) 05/08/2022   HCT 28.2 (L) 05/08/2022   MCV 95.6 05/08/2022   PLT 118 (L) 05/08/2022    Lab Results  Component Value Date   CREATININE 0.93 05/08/2022   BUN 17 05/08/2022   NA 137 05/08/2022   K 4.2 05/08/2022   CL 109 05/08/2022   CO2 19 (L) 05/08/2022    Lab Results  Component Value Date   ALT 24 05/08/2022   AST 21 05/08/2022   ALKPHOS 70 05/08/2022  BILITOT 1.2 05/08/2022      Microbiology: Recent Results (from the past 240 hour(s))  Blood Culture (routine x 2)     Status: Abnormal (Preliminary result)   Collection Time: 04/30/22  8:19 PM   Specimen: Left Antecubital; Blood  Result Value Ref Range Status   Specimen Description   Final    LEFT ANTECUBITAL BOTTLES DRAWN AEROBIC AND ANAEROBIC Performed at [facility], [address], [place], [place] [address]    Special Requests   Final    Blood Culture adequate volume Performed at [facility], [address] [place], [place] [address]    Culture  Setup Time   Final    GRAM NEGATIVE RODS AEROBIC BOTTLE ONLY CRITICAL RESULT CALLED TO, READ BACK BY AND VERIFIED WITH: PHARMD N. GLOGOVAC [id number] FCP    Culture (A)  Final    FLAVOBACTERIUM  ODORATUM MULTI-DRUG RESISTANT ORGANISM Sent to [facility] for further susceptibility testing. Performed at [facility], [address] [place], [place] [address]    Report Status PENDING  Incomplete   Organism ID, Bacteria FLAVOBACTERIUM ODORATUM  Final      Susceptibility   Flavobacterium odoratum - MIC*    CEFTAZIDIME >=64 RESISTANT Resistant     CIPROFLOXACIN 2 INTERMEDIATE Intermediate     GENTAMICIN >=16 RESISTANT Resistant     IMIPENEM >=16 RESISTANT Resistant     TRIMETH/SULFA 160 RESISTANT Resistant     PIP/TAZO >=128 RESISTANT Resistant     * FLAVOBACTERIUM ODORATUM  Blood Culture ID Panel (Reflexed)     Status: None   Collection Time: 04/30/22  8:19 PM  Result Value Ref Range Status   Enterococcus faecalis NOT DETECTED NOT DETECTED Final   Enterococcus Faecium NOT DETECTED NOT DETECTED Final   Listeria monocytogenes NOT DETECTED NOT DETECTED Final   Staphylococcus species NOT DETECTED NOT DETECTED Final   Staphylococcus aureus (BCID) NOT DETECTED NOT DETECTED Final   Staphylococcus epidermidis NOT DETECTED NOT DETECTED Final   Staphylococcus lugdunensis NOT DETECTED NOT DETECTED Final   Streptococcus species NOT DETECTED NOT DETECTED Final   Streptococcus agalactiae NOT DETECTED NOT DETECTED Final   Streptococcus pneumoniae NOT DETECTED NOT DETECTED Final   Streptococcus pyogenes NOT DETECTED NOT DETECTED Final   A.calcoaceticus-baumannii NOT DETECTED NOT DETECTED Final   Bacteroides fragilis NOT DETECTED NOT DETECTED Final   Enterobacterales NOT DETECTED NOT DETECTED Final   Enterobacter cloacae complex NOT DETECTED NOT DETECTED Final   Escherichia coli NOT DETECTED NOT DETECTED Final   Klebsiella aerogenes NOT DETECTED NOT DETECTED Final   Klebsiella oxytoca NOT DETECTED NOT DETECTED Final   Klebsiella pneumoniae NOT DETECTED NOT DETECTED Final   Proteus species NOT DETECTED NOT DETECTED Final   Salmonella species NOT DETECTED NOT DETECTED Final   Serratia  marcescens NOT DETECTED NOT DETECTED Final   Haemophilus influenzae NOT DETECTED NOT DETECTED Final   Neisseria meningitidis NOT DETECTED NOT DETECTED Final   Pseudomonas aeruginosa NOT DETECTED NOT DETECTED Final   Stenotrophomonas maltophilia NOT DETECTED NOT DETECTED Final   Candida albicans NOT DETECTED NOT DETECTED Final   Candida auris NOT DETECTED NOT DETECTED Final   Candida glabrata NOT DETECTED NOT DETECTED Final   Candida krusei NOT DETECTED NOT DETECTED Final   Candida parapsilosis NOT DETECTED NOT DETECTED Final   Candida tropicalis NOT DETECTED NOT DETECTED Final   Cryptococcus neoformans/gattii NOT DETECTED NOT DETECTED Final    Comment: Performed at [facility], [address]  [place], [place] [address]  Urine Culture (for pregnant, neutropenic or urologic patients or patients with an indwelling urinary catheter)     Status: Abnormal   Collection Time: 04/30/22 10:32 PM   Specimen: Urine, Clean Catch  Result Value Ref Range Status   Specimen Description   Final    URINE, CLEAN CATCH Performed at [facility], [address], [place], [place] [address]    Special Requests   Final    NONE Performed at [facility], [address], [place], [place] [address]    Culture >=100,000 COLONIES/mL KLEBSIELLA PNEUMONIAE (A)  Final   Report Status 05/04/2022 FINAL  Final   Organism ID, Bacteria KLEBSIELLA PNEUMONIAE (A)  Final      Susceptibility   Klebsiella pneumoniae - MIC*    AMPICILLIN >=32 RESISTANT Resistant     CEFAZOLIN <=4 SENSITIVE Sensitive     CEFEPIME <=0.12 SENSITIVE Sensitive     CEFTRIAXONE <=0.25 SENSITIVE Sensitive     CIPROFLOXACIN <=0.25 SENSITIVE Sensitive     GENTAMICIN <=1 SENSITIVE Sensitive     IMIPENEM <=0.25 SENSITIVE Sensitive     NITROFURANTOIN 64 INTERMEDIATE Intermediate     TRIMETH/SULFA <=20 SENSITIVE Sensitive     AMPICILLIN/SULBACTAM 4 SENSITIVE Sensitive     PIP/TAZO <=4 SENSITIVE Sensitive      * >=100,000 COLONIES/mL KLEBSIELLA PNEUMONIAE  MRSA Next Gen by PCR, Nasal     Status: None   Collection Time: 05/01/22  4:11 AM   Specimen: Nasal Mucosa; Nasal Swab  Result Value Ref Range Status   MRSA by PCR Next Gen NOT DETECTED NOT DETECTED Final    Comment: (NOTE) The GeneXpert MRSA Assay (FDA approved for NASAL specimens only), is one component of a comprehensive MRSA colonization surveillance program. It is not intended to diagnose MRSA infection nor to guide or monitor treatment for MRSA infections. Test performance is not FDA approved in patients less than 64 years old. Performed at [facility], [address], [place], [place] [address]   Blood Culture (routine x 2)     Status: None   Collection Time: 05/01/22 11:09 AM   Specimen: BLOOD LEFT ARM  Result Value Ref Range Status   Specimen Description   Final    BLOOD LEFT ARM Performed at [facility], [address], [place], [place] [address]    Special Requests   Final    AEROBIC BOTTLE ONLY Blood Culture adequate volume Performed at [facility], [address] [place], [place] [address]    Culture   Final    NO GROWTH 5 DAYS Performed at [facility], [address] [place], [place] [address]    Report Status 05/06/2022 FINAL  Final     Serology:    Imaging: If present, new imagings (plain films, ct scans, and mri) have been personally visualized and interpreted; radiology reports have been reviewed. Decision making incorporated into the Impression / Recommendations.  4/28 right breast u/s 1. Findings consistent with mastitis, breast edema/inflammation. 2. No evidence of an abscess. 3. No sonographic evidence of malignancy.   4/20 abd pelv chest ct without contrast 1. Trace bilateral pleural effusions with minimal associated compressive atelectasis of the left lower lobe. Pneumonia is not excluded. 2. Cirrhosis with evidence of portal  hypertension and small ascites. 3. Postsurgical changes of the bowel with ileocolic anastomosis in the right hemiabdomen. No bowel obstruction. 4. Diffuse subcutaneous edema and anasarca. 5.  Aortic Atherosclerosis (ICD10-I70.0).  Raymondo Band, MD [facility] [phone] pager    05/09/2022, 2:01 PM

## 2022-05-10 ENCOUNTER — Other Ambulatory Visit (HOSPITAL_COMMUNITY): Payer: Self-pay

## 2022-05-10 DIAGNOSIS — N61 Mastitis without abscess: Secondary | ICD-10-CM | POA: Diagnosis not present

## 2022-05-10 LAB — CBC
HCT: 24.8 % — ABNORMAL LOW (ref 36.0–46.0)
Hemoglobin: 7.9 g/dL — ABNORMAL LOW (ref 12.0–15.0)
MCH: 29.4 pg (ref 26.0–34.0)
MCHC: 31.9 g/dL (ref 30.0–36.0)
MCV: 92.2 fL (ref 80.0–100.0)
Platelets: 105 10*3/uL — ABNORMAL LOW (ref 150–400)
RBC: 2.69 MIL/uL — ABNORMAL LOW (ref 3.87–5.11)
RDW: 22.4 % — ABNORMAL HIGH (ref 11.5–15.5)
WBC: 5.4 10*3/uL (ref 4.0–10.5)
nRBC: 0 % (ref 0.0–0.2)

## 2022-05-10 LAB — BASIC METABOLIC PANEL
Anion gap: 6 (ref 5–15)
BUN: 14 mg/dL (ref 8–23)
CO2: 23 mmol/L (ref 22–32)
Calcium: 8.3 mg/dL — ABNORMAL LOW (ref 8.9–10.3)
Chloride: 111 mmol/L (ref 98–111)
Creatinine, Ser: 0.91 mg/dL (ref 0.44–1.00)
GFR, Estimated: >60 mL/min (ref >60)
Glucose, Bld: 80 mg/dL (ref 70–99)
Potassium: 3.7 mmol/L (ref 3.5–5.1)
Sodium: 140 mmol/L (ref 135–145)

## 2022-05-10 MED ORDER — FUROSEMIDE 40 MG PO TABS: 40.0000 mg | ORAL_TABLET | Freq: Two times a day (BID) | ORAL | 0 refills | Status: DC

## 2022-05-10 MED ORDER — LINEZOLID 600 MG PO TABS
600.0000 mg | ORAL_TABLET | Freq: Two times a day (BID) | ORAL | 0 refills | Status: AC
  Filled 2022-05-10: qty 12, 6d supply, fill #0

## 2022-05-10 MED ORDER — TRIAMCINOLONE ACETONIDE 0.5 % EX CREA
TOPICAL_CREAM | Freq: Two times a day (BID) | CUTANEOUS | 0 refills | Status: DC
  Filled 2022-05-10: qty 30, 10d supply, fill #0

## 2022-05-10 MED ORDER — LACTULOSE 10 GM/15ML PO SOLN
10.0000 g | Freq: Two times a day (BID) | ORAL | 0 refills | Status: DC
  Filled 2022-05-10: qty 236, 8d supply, fill #0

## 2022-05-10 MED ORDER — PANTOPRAZOLE SODIUM 40 MG PO TBEC
40.0000 mg | DELAYED_RELEASE_TABLET | Freq: Every day | ORAL | 0 refills | Status: DC
  Filled 2022-05-10: qty 30, 30d supply, fill #0

## 2022-05-10 NOTE — Discharge Summary (Signed)
[facility]  Physician Discharge Summary   Patient ID: [name] MRN: [id number] DOB/AGE: 1955/02/19 67 y.o.  Admit date: 04/30/2022 Discharge date:   05/10/2022   PCP: [name], MD  DISCHARGE DIAGNOSES:  Septic shock (HCC) Mastitis   Hypotension   Liver cirrhosis secondary to NASH (HCC)   AKI (acute kidney injury) (HCC)   Candidal skin infection   History of eye surgery   Obesity, Class III, BMI 40-49.9 (morbid obesity) (HCC)   RECOMMENDATIONS FOR OUTPATIENT FOLLOW UP: Outpatient follow-up with her primary care provider within 1 week. Patient will also follow-up with her gastroenterologist.    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 Became febrile up to 101 F at home, little confused to her son. Has been dealing with yeast infection beneath left breast and all around her groin/vulvarvaginal region. Just started doing nystatin powder. On presentation she was hypotensive, tachycardic. Lab work showed WC count of 28,000, elevated lactic acid level up to the range of 5. Chest x-ray did not show any pneumonia. CT chest/abdomen/pelvis showed trace bilateral pleural effusion, atelectasis of left lower lobe but pneumonia not excluded. Found to have edema/erythema around right breast. Patient was admitted for the management of septic shock likely from cellulitis. Started on broad spectrum antibiotics, antifungal . Hospital course remarkable for hepatic encephalopathy, anemia with positive fecal blood. GI consulted now signed off.    HOSPITAL COURSE:   Mastitis involving  right breast/septic shock/urinary tract infection Presented with fever, hypotension, tachycardia, lactic acidosis,leucocytosis.  Likely from cellulitis but chest imaging could not rule out left lower lobe infiltrate.  She was initially started on broad-spectrum antibiotics.  Urine cultures grew Klebsiella. 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  She was also started on antifungal.  After discussions with ID she was changed over to cefadroxil.  She had completed course of antibiotics. However she continued to have swelling and pain in her right breast.  Antibiotics were reinitiated.  Ultrasound of the breast does not reveal any abscess.  Seen by infectious disease.  Will be discharged on linezolid.  There is also an element of lymphedema.  Steroid cream also recommended by infectious disease. WBC is normal.  She is afebrile.  Feels better overall.  Okay for discharge home today. Continue midodrine.  Blood pressure is reasonably well-controlled.   Liver cirrhosis/NASH/anasarca/hypokalemia/hypoalbuminemia Has edema/anasarca.  Improved after she was given IV furosemide and albumin here in the hospital.  May resume home regimen at discharge. Ammonia level was high, started on lactulose. On rifaximin, restarted.  She follows with Dr Nedra Hai at [facility].  Needs to be stable for the most part.  Ammonia level had improved to 29.   Normocytic anemia/thrombocytopenia Hemoglobin dropped to 6 on 4/23.  Patient has chronic iron deficiency, iron level very low.  Gets regular iron infusion.  Denies any hematochezia or melena.  Positive FOBT.  Given iron infusion on 4/22.   GI consulted: Recommended to monitor and continue Protonix.  No plan for intervention for now.   Also has mild thrombocytopenia: Most likely secondary to history of cirrhosis.   Bleeding from the vulvar area She is  noted to have some mild skin tears/mucosal tears in her vulvar region.  Pure wick to be  discontinued.  No profuse bleeding noted.    Recent history of eye surgery Blind in left eye with recent gunderson flap surgery on 4/19 with [facility]mology.  Continue antibiotic ophthalmic ointment, ketorolac eye drop   Itchy rash Complained of intense itchy rash on her back.  Noticed to have erythematous rash on the back.  Local ointment/oral Benadryl not helping.  Started on prednisone with improvement .  But patient became more confused show prednisone discontinued.     AKI Creatinine of 1.5 on presentation.  Resolved.   Hypokalemia:  Supplemented   Hyperlipidemia On Lipitor   History of gout On colchicine as needed.   Hypothyroidism On Synthyroid   History of depression Venlafaxine.   Morbid obesity Estimated body mass index is 48.31 kg/m as calculated from the following:   Height as of this encounter: 5\' 2"  (1.575 m).   Weight as of this encounter: 119.8 kg.    Patient is stable.  Okay for discharge home today.  Discussed with her son.   PERTINENT LABS:  The results of significant diagnostics from this hospitalization (including imaging, microbiology, ancillary and laboratory) are listed below for reference.    Microbiology: Recent Results (from the past 240 hour(s))  Blood Culture (routine x 2)     Status: Abnormal (Preliminary result)   Collection Time: 04/30/22  8:19 PM   Specimen: Left Antecubital; Blood  Result Value Ref Range Status   Specimen Description   Final    LEFT ANTECUBITAL BOTTLES DRAWN AEROBIC AND ANAEROBIC Performed at [facility], [address], [place], [place] [address]    Special Requests   Final    Blood Culture adequate volume Performed at [facility], [address], [place], [place] [address]    Culture  Setup Time   Final    GRAM NEGATIVE RODS AEROBIC BOTTLE ONLY CRITICAL RESULT CALLED TO, READ BACK BY AND VERIFIED WITH: PHARMD N. GLOGOVAC [id number] FCP    Culture (A)   Final    FLAVOBACTERIUM ODORATUM MULTI-DRUG RESISTANT ORGANISM Sent to [facility] for further susceptibility testing. Performed at [facility], [address], [place], [place] [address]    Report Status PENDING  Incomplete   Organism ID, Bacteria FLAVOBACTERIUM ODORATUM  Final      Susceptibility   Flavobacterium odoratum - MIC*    CEFTAZIDIME >=64 RESISTANT Resistant     CIPROFLOXACIN 2 INTERMEDIATE Intermediate     GENTAMICIN >=16 RESISTANT Resistant     IMIPENEM >=16 RESISTANT Resistant     TRIMETH/SULFA 160 RESISTANT Resistant     PIP/TAZO >=128 RESISTANT Resistant     * FLAVOBACTERIUM ODORATUM  Blood Culture ID Panel (Reflexed)     Status: None   Collection Time: 04/30/22  8:19 PM  Result Value Ref Range Status   Enterococcus faecalis NOT DETECTED NOT DETECTED Final   Enterococcus Faecium NOT DETECTED NOT DETECTED Final   Listeria monocytogenes NOT DETECTED NOT DETECTED Final   Staphylococcus species NOT DETECTED NOT DETECTED Final   Staphylococcus aureus (BCID) NOT DETECTED NOT DETECTED Final   Staphylococcus epidermidis NOT DETECTED NOT DETECTED Final   Staphylococcus lugdunensis NOT DETECTED NOT DETECTED Final   Streptococcus species NOT DETECTED NOT DETECTED Final   Streptococcus agalactiae NOT DETECTED NOT DETECTED Final   Streptococcus pneumoniae NOT DETECTED NOT DETECTED Final   Streptococcus pyogenes NOT DETECTED NOT DETECTED Final  A.calcoaceticus-baumannii NOT DETECTED NOT DETECTED Final   Bacteroides fragilis NOT DETECTED NOT DETECTED Final   Enterobacterales NOT DETECTED NOT DETECTED Final   Enterobacter cloacae complex NOT DETECTED NOT DETECTED Final   Escherichia coli NOT DETECTED NOT DETECTED Final   Klebsiella aerogenes NOT DETECTED NOT DETECTED Final   Klebsiella oxytoca NOT DETECTED NOT DETECTED Final   Klebsiella pneumoniae NOT DETECTED NOT DETECTED Final   Proteus species NOT DETECTED NOT DETECTED Final   Salmonella species NOT DETECTED NOT  DETECTED Final   Serratia marcescens NOT DETECTED NOT DETECTED Final   Haemophilus influenzae NOT DETECTED NOT DETECTED Final   Neisseria meningitidis NOT DETECTED NOT DETECTED Final   Pseudomonas aeruginosa NOT DETECTED NOT DETECTED Final   Stenotrophomonas maltophilia NOT DETECTED NOT DETECTED Final   Candida albicans NOT DETECTED NOT DETECTED Final   Candida auris NOT DETECTED NOT DETECTED Final   Candida glabrata NOT DETECTED NOT DETECTED Final   Candida krusei NOT DETECTED NOT DETECTED Final   Candida parapsilosis NOT DETECTED NOT DETECTED Final   Candida tropicalis NOT DETECTED NOT DETECTED Final   Cryptococcus neoformans/gattii NOT DETECTED NOT DETECTED Final    Comment: Performed at [facility], [address] [place], [place] [address]  Urine Culture (for pregnant, neutropenic or urologic patients or patients with an indwelling urinary catheter)     Status: Abnormal   Collection Time: 04/30/22 10:32 PM   Specimen: Urine, Clean Catch  Result Value Ref Range Status   Specimen Description   Final    URINE, CLEAN CATCH Performed at [facility], [address], [place], [place] [address]    Special Requests   Final    NONE Performed at [facility], [address], [place], [place] [address]    Culture >=100,000 COLONIES/mL KLEBSIELLA PNEUMONIAE (A)  Final   Report Status 05/04/2022 FINAL  Final   Organism ID, Bacteria KLEBSIELLA PNEUMONIAE (A)  Final      Susceptibility   Klebsiella pneumoniae - MIC*    AMPICILLIN >=32 RESISTANT Resistant     CEFAZOLIN <=4 SENSITIVE Sensitive     CEFEPIME <=0.12 SENSITIVE Sensitive     CEFTRIAXONE <=0.25 SENSITIVE Sensitive     CIPROFLOXACIN <=0.25 SENSITIVE Sensitive     GENTAMICIN <=1 SENSITIVE Sensitive     IMIPENEM <=0.25 SENSITIVE Sensitive     NITROFURANTOIN 64 INTERMEDIATE Intermediate     TRIMETH/SULFA <=20 SENSITIVE Sensitive     AMPICILLIN/SULBACTAM 4 SENSITIVE Sensitive      PIP/TAZO <=4 SENSITIVE Sensitive     * >=100,000 COLONIES/mL KLEBSIELLA PNEUMONIAE  MRSA Next Gen by PCR, Nasal     Status: None   Collection Time: 05/01/22  4:11 AM   Specimen: Nasal Mucosa; Nasal Swab  Result Value Ref Range Status   MRSA by PCR Next Gen NOT DETECTED NOT DETECTED Final    Comment: (NOTE) The GeneXpert MRSA Assay (FDA approved for NASAL specimens only), is one component of a comprehensive MRSA colonization surveillance program. It is not intended to diagnose MRSA infection nor to guide or monitor treatment for MRSA infections. Test performance is not FDA approved in patients less than 52 years old. Performed at [facility], [address], [place], [place] [address]   Blood Culture (routine x 2)     Status: None   Collection Time: 05/01/22 11:09 AM   Specimen: BLOOD LEFT ARM  Result Value Ref Range Status   Specimen Description   Final    BLOOD  LEFT ARM Performed at [facility], [address], [place], [place] [address]    Special Requests   Final    AEROBIC BOTTLE ONLY Blood Culture adequate volume Performed at [facility], [address] [place], [place] [address]    Culture   Final    NO GROWTH 5 DAYS Performed at [facility], [address], [place], [place] [address]    Report Status 05/06/2022 FINAL  Final     Labs:   Basic Metabolic Panel: Recent Labs  Lab 05/05/22 0410 05/06/22 0417 05/06/22 1140 05/07/22 0434 05/08/22 0403 05/10/22 0429  NA 139 139  --  139 137 140  K 3.1* 3.3*  --  3.4* 4.2 3.7  CL 112* 112*  --  113* 109 111  CO2 17* 19*  --  18* 19* 23  GLUCOSE 92 77  --  115* 84 80  BUN 28* 24*  --  21 17 14   CREATININE 1.21* 1.12*  --  1.06* 0.93 0.91  CALCIUM 7.6* 7.7*  --  7.5* 7.5* 8.3*  MG  --   --  1.4* 2.0 2.0  --   PHOS  --   --   --  2.5 2.9  --    Liver Function Tests: Recent Labs  Lab 05/04/22 0427 05/07/22 0434 05/08/22 0403  AST 29  --  21  ALT 27  --   24  ALKPHOS 73  --  70  BILITOT 1.6*  --  1.2  PROT 6.1*  --  4.9*  ALBUMIN 1.9* 1.6* 1.6*    Recent Labs  Lab 05/04/22 0432  AMMONIA 29   CBC: Recent Labs  Lab 05/04/22 0427 05/05/22 0410 05/06/22 0417 05/08/22 1104 05/10/22 0429  WBC 12.7* 9.7 8.5 8.2 5.4  HGB 8.5* 7.4* 8.5* 8.7* 7.9*  HCT 26.0* 22.2* 26.5* 28.2* 24.8*  MCV 89.7 88.1 89.8 95.6 92.2  PLT 138* 131* 138* 118* 105*    IMAGING STUDIES MS US BREAST LTD UNI RIGHT INC AXILLA  Result Date: 05/09/2022 CLINICAL DATA:  Clinical concern for right breast abscess. EXAM: ULTRASOUND OF THE RIGHT BREAST COMPARISON:  Right breast ultrasound on 12/21/2021. Most recent prior mammography dated 08/24/2020. FINDINGS: Targeted ultrasound is performed, showing hyperechoic fat and intervening edema, but no defined collection to suggest an abscess. No mass. IMPRESSION: 1. Findings consistent with mastitis, breast edema/inflammation. 2. No evidence of an abscess. 3. No sonographic evidence of malignancy. RECOMMENDATION: 1. Antibiotic therapy recommended for the presumed right breast mastitis. 2. Follow-up at the breast Center for right breast diagnostic mammography and possible repeat ultrasound in 10-14 days to assess for improvement/resolution. Earlier follow-up recommended if symptoms worsen. I have discussed the findings and recommendations with the patient. If applicable, a reminder letter will be sent to the patient regarding the next appointment. BI-RADS CATEGORY  2: Benign. Electronically Signed   By: Amie Portland M.D.   On: 05/09/2022 12:39   CT CHEST ABDOMEN PELVIS WO CONTRAST  Result Date: 04/30/2022 CLINICAL DATA:  Sepsis.  Cough. EXAM: CT CHEST, ABDOMEN AND PELVIS WITHOUT CONTRAST TECHNIQUE: Multidetector CT imaging of the chest, abdomen and pelvis was performed following the standard protocol without IV contrast. RADIATION DOSE REDUCTION: This exam was performed according to the departmental dose-optimization program which  includes automated exposure control, adjustment of the mA and/or kV according to patient size and/or use of iterative reconstruction technique. COMPARISON:  Chest CT dated 02/07/2022. FINDINGS: Evaluation of this exam is limited  in the absence of intravenous contrast. CT CHEST FINDINGS Cardiovascular: There is no cardiomegaly or pericardial effusion. Advanced 3 vessel coronary vascular calcification. Mild atherosclerotic calcification of the thoracic aorta. No aneurysmal dilatation. The central pulmonary arteries are grossly unremarkable on this noncontrast CT. Mediastinum/Nodes: No hilar or mediastinal adenopathy. The esophagus is grossly unremarkable. There is a 1.8 cm rim calcified right thyroid nodule. No mediastinal fluid collection. Lungs/Pleura: Trace bilateral pleural effusions. There is minimal associated compressive atelectasis of the left lower lobe. Pneumonia is not excluded. There is no consolidative changes or pneumothorax. The central airways are patent. Musculoskeletal: Osteopenia with degenerative changes of spine. No acute osseous pathology. CT ABDOMEN PELVIS FINDINGS No intra-abdominal free air.  Small ascites. Hepatobiliary: Irregular liver contour consistent with cirrhosis. No biliary dilatation. Cholecystectomy. Pancreas: The pancreas is unremarkable. Spleen: Normal in size without focal abnormality. Adrenals/Urinary Tract: The adrenal glands are unremarkable. Vascular calcification versus a 3 mm nonobstructing right renal upper pole calculus. No hydronephrosis. The left kidney is unremarkable. The urinary bladder is grossly unremarkable. Stomach/Bowel: There is postsurgical changes of the bowel with ileocolic anastomosis in the right hemiabdomen. No bowel obstruction. Vascular/Lymphatic: Moderate aortoiliac atherosclerotic disease. The IVC is unremarkable. No portal venous gas. No adenopathy. A tangle of dilated vessels in the abdomen consistent with varices and indicative of portal  hypertension. Reproductive: The uterus is anteverted. Multiple calcified fibroids measure up to 6 cm. Other: There is diffuse subcutaneous edema. There is a 2 cm peritoneal defect in the left anterior pelvic wall with herniation of small amount of ascitic fluid. Musculoskeletal: Osteopenia with degenerative changes of the spine. No acute osseous pathology. IMPRESSION: 1. Trace bilateral pleural effusions with minimal associated compressive atelectasis of the left lower lobe. Pneumonia is not excluded. 2. Cirrhosis with evidence of portal hypertension and small ascites. 3. Postsurgical changes of the bowel with ileocolic anastomosis in the right hemiabdomen. No bowel obstruction. 4. Diffuse subcutaneous edema and anasarca. 5.  Aortic Atherosclerosis (ICD10-I70.0). Electronically Signed   By: Elgie Collard M.D.   On: 04/30/2022 21:52   DG Chest Port 1 View  Result Date: 04/30/2022 CLINICAL DATA:  Sepsis EXAM: PORTABLE CHEST 1 VIEW COMPARISON:  X-ray 05/13/2021.  CT scan 02/07/2022 FINDINGS: Underinflation with enlarged cardiopericardial silhouette. Left midlung scar or atelectasis. No consolidation, pneumothorax. Decreasing left effusion. Films are under penetrated. Calcified aorta. Overlapping cardiac leads IMPRESSION: Underinflation. Left midlung scar or atelectasis. Improving left pleural effusion Electronically Signed   By: Karen Kays M.D.   On: 04/30/2022 20:39    DISCHARGE EXAMINATION: Vitals:   05/09/22 1243 05/09/22 1940 05/10/22 0439 05/10/22 0750  BP: 120/75 (!) 105/52 118/60 118/67  Pulse: 85 78 85 84  Resp: (!) 24 17 16  (!) 22  Temp: 98 F (36.7 C) 97.9 F (36.6 C) 97.9 F (36.6 C) 97.8 F (36.6 C)  TempSrc: Oral Oral Oral Oral  SpO2: 100% 97% 97% 97%  Weight:      Height:       General appearance: Awake alert.  In no distress Resp: Clear to auscultation bilaterally.  Normal effort Cardio: S1-S2 is normal regular.  No S3-S4.  No rubs murmurs or bruit GI: Abdomen is soft.   Nontender nondistended.  Bowel sounds are present normal.  No masses organomegaly    DISPOSITION: Home  Discharge Instructions     Call MD for:  extreme fatigue   Complete by: As directed    Call MD for:  persistant dizziness or light-headedness   Complete by: As directed  Call MD for:  persistant nausea and vomiting   Complete by: As directed    Call MD for:  redness, tenderness, or signs of infection (pain, swelling, redness, odor or green/yellow discharge around incision site)   Complete by: As directed    Call MD for:  severe uncontrolled pain   Complete by: As directed    Call MD for:  temperature >100.4   Complete by: As directed    Diet - low sodium heart healthy   Complete by: As directed    Discharge instructions   Complete by: As directed    Please take your medications as prescribed.  Please be sure to follow-up with your primary care provider within 1 week after discharge.  Follow-up with your gastroenterologist in [facility].  You were cared for by a hospitalist during your hospital stay. If you have any questions about your discharge medications or the care you received while you were in the hospital after you are discharged, you can call the unit and asked to speak with the hospitalist on call if the hospitalist that took care of you is not available. Once you are discharged, your primary care physician will handle any further medical issues. Please note that NO REFILLS for any discharge medications will be authorized once you are discharged, as it is imperative that you return to your primary care physician (or establish a relationship with a primary care physician if you do not have one) for your aftercare needs so that they can reassess your need for medications and monitor your lab values. If you do not have a primary care physician, you can call [phone] for a physician referral.   Increase activity slowly   Complete by: As directed    No wound care   Complete by:  As directed          Allergies as of 05/10/2022       Reactions   Ivp Dye [iodinated Contrast Media] Anaphylaxis   Ace Inhibitors Swelling   Chlorhexidine    Pt has a rash, & made irritation skin further   Doxycycline Rash   Penicillins Rash   Did it involve swelling of the face/tongue/throat, SOB, or low BP? No Did it involve sudden or severe rash/hives, skin peeling, or any reaction on the inside of your mouth or nose? Yes Did you need to seek medical attention at a hospital or doctor's office? No When did it last happen? Several Years Ago      If all above answers are "NO", may proceed with cephalosporin use.   Zofran [ondansetron] Rash        Medication List     STOP taking these medications    azithromycin 250 MG tablet Commonly known as: ZITHROMAX   famotidine 40 MG tablet Commonly known as: PEPCID   oxyCODONE-acetaminophen 5-325 MG tablet Commonly known as: PERCOCET/ROXICET       TAKE these medications    acetaminophen 325 MG tablet Commonly known as: TYLENOL Take 650 mg by mouth every 6 (six) hours as needed for moderate pain or headache. Patient used this medication for her headache.   allopurinol 100 MG tablet Commonly known as: ZYLOPRIM Take 100 mg by mouth daily.   atorvastatin 40 MG tablet Commonly known as: LIPITOR Take 40 mg by mouth in the morning.   colchicine 0.6 MG tablet Take 0.6 mg by mouth daily as needed (gout).   cyanocobalamin 1000 MCG tablet Commonly known as: VITAMIN B12 Take 2,000 mcg by mouth  daily.   EPINEPHrine 0.3 mg/0.3 mL Soaj injection Commonly known as: EPI-PEN Inject 0.3 mg into the muscle as needed for anaphylaxis.   furosemide 40 MG tablet Commonly known as: LASIX Take 40 mg by mouth 2 (two) times daily. What changed: Another medication with the same name was removed. Continue taking this medication, and follow the directions you see here.   HYDROcodone-acetaminophen 10-325 MG tablet Commonly known as:  NORCO Take 1 tablet by mouth every 6 (six) hours as needed for moderate pain or severe pain.   hydrOXYzine 25 MG tablet Commonly known as: ATARAX Take 25 mg by mouth 3 (three) times daily as needed for itching.   IRON-VITAMIN C PO Take 1 tablet by mouth daily.   lactulose 10 GM/15ML solution Commonly known as: CHRONULAC Take 15 mLs (10 g total) by mouth 2 (two) times daily.   levothyroxine 150 MCG tablet Commonly known as: SYNTHROID Take 150 mcg by mouth daily before breakfast.   linezolid 600 MG tablet Commonly known as: ZYVOX Take 1 tablet (600 mg total) by mouth every 12 (twelve) hours for 6 days.   magnesium oxide 400 MG tablet Commonly known as: MAG-OX Take 400 mg by mouth daily.   methocarbamol 500 MG tablet Commonly known as: ROBAXIN Take 500 mg by mouth daily.   metoprolol succinate 25 MG 24 hr tablet Commonly known as: TOPROL-XL Take 12.5 mg by mouth daily as needed (afib).   midodrine 5 MG tablet Commonly known as: PROAMATINE Take 5 mg by mouth daily as needed (low blood pressure).   naloxone 4 MG/0.1ML Liqd nasal spray kit Commonly known as: NARCAN Place 1 spray into the nose as needed (accidental overdose).   neomycin-polymyxin b-dexamethasone 3.5-10000-0.1 Oint Commonly known as: MAXITROL Apply to eye.   nortriptyline 25 MG capsule Commonly known as: PAMELOR Take 25 mg by mouth at bedtime as needed for sleep.   nystatin powder Generic drug: nystatin Apply 1 Application topically daily as needed (rash).   pantoprazole 40 MG tablet Commonly known as: PROTONIX Take 1 tablet (40 mg total) by mouth daily.   potassium chloride 10 MEQ CR capsule Commonly known as: MICRO-K Take 20 mEq by mouth 2 (two) times daily.   promethazine 12.5 MG tablet Commonly known as: PHENERGAN Take 12.5 mg by mouth every 6 (six) hours as needed for nausea or vomiting.   rifaximin 550 MG Tabs tablet Commonly known as: XIFAXAN Take 550 mg by mouth 2 (two) times  daily.   spironolactone 25 MG tablet Commonly known as: ALDACTONE Take 25 mg by mouth daily.   triamcinolone cream 0.5 % Commonly known as: KENALOG Apply to affected area of right breast twice daily as directed.   venlafaxine XR 75 MG 24 hr capsule Commonly known as: EFFEXOR-XR Take 75 mg by mouth daily with breakfast.   Vitamin D (Ergocalciferol) 1.25 MG (50000 UNIT) Caps capsule Commonly known as: DRISDOL Take 50,000 Units by mouth every 7 (seven) days.               Durable Medical Equipment  (From admission, onward)           Start     Ordered   05/06/22 1557  For home use only DME standard manual wheelchair with seat cushion  Once       Comments: Patient suffers from sepsis which impairs their ability to perform daily activities like bathing, dressing, feeding, grooming, and toileting in the home.  A walker will not resolve issue with performing activities  of daily living. A wheelchair will allow patient to safely perform daily activities. Patient can safely propel the wheelchair in the home or has a caregiver who can provide assistance. Length of need 12 months . Accessories: elevating leg rests (ELRs), wheel locks, extensions and anti-tippers.   05/06/22 1557              Follow-up Information     [name], MD Follow up.   Specialty: Family Medicine Why: post hospitalization follow up Contact information: [id number] [place] [address] [phone]                 TOTAL DISCHARGE TIME: 35 minutes     [facility] Pager on [email or web address]  05/10/2022, 10:56 AM

## 2022-05-10 NOTE — Care Management Important Message (Signed)
Important Message  Patient Details IM Letter given. Name: [name] MRN: [id number] Date of Birth: 07/11/1955   Medicare Important Message Given:  Yes     [name] 05/10/2022, 9:09 AM

## 2022-05-10 NOTE — TOC Transition Note (Signed)
Transition of Care [facility]) - CM/SW Discharge Note   Patient Details  Name: [name] MRN: [id number] Date of Birth: 07/10/1955  [facility] CM/SW Contact:  [name], RN Phone Number: 05/10/2022, 9:27 AM   Clinical Narrative:  DC Home. Wheelchair delivered to [employer], [employer]. Home Health PT/OT- [facility], rep-[name]. Pt confirms  transport home arranged. No further TOC needs identified         Barriers to Discharge: Barriers Resolved   Patient Goals and CMS Choice CMS Medicare.gov Compare Post Acute Care list provided to:: Other (Comment Required) [name] (son)) Choice offered to / list presented to : Adult Children  Discharge Placement                         Discharge Plan and Services Additional resources added to the After Visit Summary for     Discharge Planning Services: CM Consult Post Acute Care Choice: Home Health            DME Agency: [name] Date DME Agency Contacted: 05/10/22 Time DME Agency Contacted: [phone] Representative spoke with at DME Agency: [name] HH Arranged: PT, OT HH Agency: [facility] ([facility]) Date HH Agency Contacted: 05/10/22 Time HH Agency Contacted: [phone] Representative spoke with at [facility] Agency: [name]  Social Determinants of Health (SDOH) Interventions SDOH Screenings   Food Insecurity: No Food Insecurity (05/01/2022)  Housing: Low Risk  (05/01/2022)  Transportation Needs: No Transportation Needs (05/01/2022)  Utilities: Not At Risk (05/01/2022)  Tobacco Use: Medium Risk (05/01/2022)     Readmission Risk Interventions    05/06/2022    3:39 PM  Readmission Risk Prevention Plan  Transportation Screening Complete  PCP or Specialist Appt within 5-7 Days Complete  Home Care Screening Complete  Medication Review (RN CM) Complete

## 2022-05-10 NOTE — Progress Notes (Signed)
Mobility Specialist - Progress Note   05/10/22 1053  Mobility  Activity Ambulated with assistance in room  Level of Assistance Standby assist, set-up cues, supervision of patient - no hands on  Assistive Device Front wheel walker  Distance Ambulated (ft) 20 ft  Activity Response Tolerated well  Mobility Referral Yes  $Mobility charge 1 Mobility   Pt received in bed and agreeable to mobility. Pt tolerated ambulating to door & back to bed. No complaints during session. Pt to bed after session with all needs met.   [facility]

## 2022-05-11 LAB — MISC LABCORP TEST (SEND OUT)

## 2022-05-11 LAB — MIC RESULT

## 2022-05-12 LAB — MINIMUM INHIBITORY CONC. (1 DRUG)

## 2022-05-16 ENCOUNTER — Emergency Department (HOSPITAL_COMMUNITY): Payer: Medicare (Managed Care)

## 2022-05-16 ENCOUNTER — Other Ambulatory Visit: Payer: Self-pay

## 2022-05-16 ENCOUNTER — Encounter (HOSPITAL_COMMUNITY): Payer: Self-pay

## 2022-05-16 ENCOUNTER — Inpatient Hospital Stay (HOSPITAL_COMMUNITY)
Admission: EM | Admit: 2022-05-16 | Discharge: 2022-05-22 | DRG: 871 | Disposition: A | Discharge status: Home Health | Payer: Medicare (Managed Care) | Attending: Internal Medicine | Admitting: Internal Medicine

## 2022-05-16 DIAGNOSIS — D696 Thrombocytopenia, unspecified: Secondary | ICD-10-CM | POA: Diagnosis present

## 2022-05-16 DIAGNOSIS — D6959 Other secondary thrombocytopenia: Secondary | ICD-10-CM | POA: Diagnosis present

## 2022-05-16 DIAGNOSIS — D509 Iron deficiency anemia, unspecified: Secondary | ICD-10-CM | POA: Diagnosis present

## 2022-05-16 DIAGNOSIS — I10 Essential (primary) hypertension: Secondary | ICD-10-CM | POA: Diagnosis present

## 2022-05-16 DIAGNOSIS — Z9049 Acquired absence of other specified parts of digestive tract: Secondary | ICD-10-CM

## 2022-05-16 DIAGNOSIS — A4151 Sepsis due to Escherichia coli [E. coli]: Secondary | ICD-10-CM | POA: Diagnosis present

## 2022-05-16 DIAGNOSIS — K7682 Hepatic encephalopathy: Secondary | ICD-10-CM | POA: Diagnosis present

## 2022-05-16 DIAGNOSIS — E039 Hypothyroidism, unspecified: Secondary | ICD-10-CM | POA: Diagnosis present

## 2022-05-16 DIAGNOSIS — R6520 Severe sepsis without septic shock: Secondary | ICD-10-CM | POA: Diagnosis present

## 2022-05-16 DIAGNOSIS — Z1611 Resistance to penicillins: Secondary | ICD-10-CM | POA: Diagnosis present

## 2022-05-16 DIAGNOSIS — N179 Acute kidney failure, unspecified: Secondary | ICD-10-CM | POA: Diagnosis present

## 2022-05-16 DIAGNOSIS — H5462 Unqualified visual loss, left eye, normal vision right eye: Secondary | ICD-10-CM | POA: Diagnosis present

## 2022-05-16 DIAGNOSIS — N61 Mastitis without abscess: Secondary | ICD-10-CM | POA: Diagnosis present

## 2022-05-16 DIAGNOSIS — G9341 Metabolic encephalopathy: Secondary | ICD-10-CM | POA: Diagnosis present

## 2022-05-16 DIAGNOSIS — E66813 Obesity, class 3: Secondary | ICD-10-CM | POA: Diagnosis present

## 2022-05-16 DIAGNOSIS — K7460 Unspecified cirrhosis of liver: Secondary | ICD-10-CM | POA: Diagnosis present

## 2022-05-16 DIAGNOSIS — A419 Sepsis, unspecified organism: Principal | ICD-10-CM | POA: Diagnosis present

## 2022-05-16 DIAGNOSIS — Z87891 Personal history of nicotine dependence: Secondary | ICD-10-CM

## 2022-05-16 DIAGNOSIS — Z1629 Resistance to other single specified antibiotic: Secondary | ICD-10-CM | POA: Diagnosis present

## 2022-05-16 DIAGNOSIS — K219 Gastro-esophageal reflux disease without esophagitis: Secondary | ICD-10-CM | POA: Diagnosis present

## 2022-05-16 DIAGNOSIS — Z86718 Personal history of other venous thrombosis and embolism: Secondary | ICD-10-CM

## 2022-05-16 DIAGNOSIS — F32A Depression, unspecified: Secondary | ICD-10-CM | POA: Diagnosis present

## 2022-05-16 DIAGNOSIS — Z1623 Resistance to quinolones and fluoroquinolones: Secondary | ICD-10-CM | POA: Diagnosis present

## 2022-05-16 DIAGNOSIS — K90829 Short bowel syndrome, unspecified: Secondary | ICD-10-CM | POA: Diagnosis present

## 2022-05-16 DIAGNOSIS — E872 Acidosis, unspecified: Secondary | ICD-10-CM | POA: Diagnosis present

## 2022-05-16 DIAGNOSIS — Z87442 Personal history of urinary calculi: Secondary | ICD-10-CM

## 2022-05-16 DIAGNOSIS — Z6841 Body Mass Index (BMI) 40.0 and over, adult: Secondary | ICD-10-CM

## 2022-05-16 DIAGNOSIS — Z87440 Personal history of urinary (tract) infections: Secondary | ICD-10-CM

## 2022-05-16 DIAGNOSIS — L24A2 Irritant contact dermatitis due to fecal, urinary or dual incontinence: Secondary | ICD-10-CM | POA: Diagnosis present

## 2022-05-16 DIAGNOSIS — M109 Gout, unspecified: Secondary | ICD-10-CM | POA: Diagnosis present

## 2022-05-16 DIAGNOSIS — Z86711 Personal history of pulmonary embolism: Secondary | ICD-10-CM

## 2022-05-16 DIAGNOSIS — Z880 Allergy status to penicillin: Secondary | ICD-10-CM

## 2022-05-16 DIAGNOSIS — Z79899 Other long term (current) drug therapy: Secondary | ICD-10-CM

## 2022-05-16 DIAGNOSIS — Z1619 Resistance to other specified beta lactam antibiotics: Secondary | ICD-10-CM | POA: Diagnosis present

## 2022-05-16 DIAGNOSIS — N390 Urinary tract infection, site not specified: Secondary | ICD-10-CM | POA: Diagnosis present

## 2022-05-16 DIAGNOSIS — R4182 Altered mental status, unspecified: Secondary | ICD-10-CM | POA: Diagnosis present

## 2022-05-16 DIAGNOSIS — K7581 Nonalcoholic steatohepatitis (NASH): Secondary | ICD-10-CM | POA: Diagnosis not present

## 2022-05-16 DIAGNOSIS — Z881 Allergy status to other antibiotic agents status: Secondary | ICD-10-CM

## 2022-05-16 DIAGNOSIS — Z79890 Hormone replacement therapy (postmenopausal): Secondary | ICD-10-CM

## 2022-05-16 DIAGNOSIS — Z91041 Radiographic dye allergy status: Secondary | ICD-10-CM

## 2022-05-16 DIAGNOSIS — Z888 Allergy status to other drugs, medicaments and biological substances status: Secondary | ICD-10-CM

## 2022-05-16 LAB — COMPREHENSIVE METABOLIC PANEL
ALT: 16 U/L (ref 0–44)
AST: 19 U/L (ref 15–41)
Albumin: 2.5 g/dL — ABNORMAL LOW (ref 3.5–5.0)
Alkaline Phosphatase: 84 U/L (ref 38–126)
Anion gap: 8 (ref 5–15)
BUN: 21 mg/dL (ref 8–23)
CO2: 20 mmol/L — ABNORMAL LOW (ref 22–32)
Calcium: 8.4 mg/dL — ABNORMAL LOW (ref 8.9–10.3)
Chloride: 112 mmol/L — ABNORMAL HIGH (ref 98–111)
Creatinine, Ser: 1.47 mg/dL — ABNORMAL HIGH (ref 0.44–1.00)
GFR, Estimated: 40 mL/min — ABNORMAL LOW (ref >60)
Glucose, Bld: 123 mg/dL — ABNORMAL HIGH (ref 70–99)
Potassium: 4.8 mmol/L (ref 3.5–5.1)
Sodium: 140 mmol/L (ref 135–145)
Total Bilirubin: 2.3 mg/dL — ABNORMAL HIGH (ref 0.3–1.2)
Total Protein: 6.2 g/dL — ABNORMAL LOW (ref 6.5–8.1)

## 2022-05-16 LAB — URINALYSIS, W/ REFLEX TO CULTURE (INFECTION SUSPECTED)
Bilirubin Urine: NEGATIVE
Glucose, UA: NEGATIVE mg/dL
Ketones, ur: NEGATIVE mg/dL
Nitrite: NEGATIVE
Protein, ur: 30 mg/dL — AB
Specific Gravity, Urine: 1.013 (ref 1.005–1.030)
pH: 5 (ref 5.0–8.0)

## 2022-05-16 LAB — CBC WITH DIFFERENTIAL/PLATELET
Abs Immature Granulocytes: 0.04 10*3/uL (ref 0.00–0.07)
Basophils Absolute: 0.1 10*3/uL (ref 0.0–0.1)
Basophils Relative: 1 %
Eosinophils Absolute: 0.7 10*3/uL — ABNORMAL HIGH (ref 0.0–0.5)
Eosinophils Relative: 6 %
HCT: 28.2 % — ABNORMAL LOW (ref 36.0–46.0)
Hemoglobin: 8.7 g/dL — ABNORMAL LOW (ref 12.0–15.0)
Immature Granulocytes: 0 %
Lymphocytes Relative: 15 %
Lymphs Abs: 1.9 10*3/uL (ref 0.7–4.0)
MCH: 29.4 pg (ref 26.0–34.0)
MCHC: 30.9 g/dL (ref 30.0–36.0)
MCV: 95.3 fL (ref 80.0–100.0)
Monocytes Absolute: 0.8 10*3/uL (ref 0.1–1.0)
Monocytes Relative: 6 %
Neutro Abs: 9.1 10*3/uL — ABNORMAL HIGH (ref 1.7–7.7)
Neutrophils Relative %: 72 %
Platelets: 141 10*3/uL — ABNORMAL LOW (ref 150–400)
RBC: 2.96 MIL/uL — ABNORMAL LOW (ref 3.87–5.11)
RDW: 22 % — ABNORMAL HIGH (ref 11.5–15.5)
WBC: 12.7 10*3/uL — ABNORMAL HIGH (ref 4.0–10.5)
nRBC: 0 % (ref 0.0–0.2)

## 2022-05-16 LAB — RAPID URINE DRUG SCREEN, HOSP PERFORMED
Amphetamines: NOT DETECTED
Barbiturates: NOT DETECTED
Benzodiazepines: NOT DETECTED
Cocaine: NOT DETECTED
Opiates: NOT DETECTED
Tetrahydrocannabinol: NOT DETECTED

## 2022-05-16 LAB — AMMONIA: Ammonia: 22 umol/L (ref 9–35)

## 2022-05-16 LAB — LACTIC ACID, PLASMA: Lactic Acid, Venous: 3.8 mmol/L (ref 0.5–1.9)

## 2022-05-16 LAB — ETHANOL: Alcohol, Ethyl (B): <10 mg/dL (ref <10)

## 2022-05-16 LAB — CBG MONITORING, ED: Glucose-Capillary: 108 mg/dL — ABNORMAL HIGH (ref 70–99)

## 2022-05-16 MED ORDER — ENOXAPARIN SODIUM 40 MG/0.4ML IJ SOSY: 40.0000 mg | PREFILLED_SYRINGE | INTRAMUSCULAR | Status: DC

## 2022-05-16 MED ORDER — VENLAFAXINE HCL ER 75 MG PO CP24
75.0000 mg | ORAL_CAPSULE | Freq: Every day | ORAL | Status: DC
  Administered 2022-05-17 – 2022-05-22 (×6): 75 mg via ORAL
  Filled 2022-05-16 (×6): qty 1

## 2022-05-16 MED ORDER — PROMETHAZINE HCL 25 MG PO TABS: 12.5000 mg | ORAL_TABLET | Freq: Four times a day (QID) | ORAL | Status: DC | PRN

## 2022-05-16 MED ORDER — FUROSEMIDE 40 MG PO TABS: 40.0000 mg | ORAL_TABLET | Freq: Two times a day (BID) | ORAL | Status: DC

## 2022-05-16 MED ORDER — MAGNESIUM OXIDE -MG SUPPLEMENT 400 (240 MG) MG PO TABS
400.0000 mg | ORAL_TABLET | Freq: Every day | ORAL | Status: DC
  Administered 2022-05-17 – 2022-05-19 (×3): 400 mg via ORAL
  Filled 2022-05-16 (×3): qty 1

## 2022-05-16 MED ORDER — HYDROXYZINE HCL 25 MG PO TABS
25.0000 mg | ORAL_TABLET | Freq: Three times a day (TID) | ORAL | Status: DC | PRN
  Administered 2022-05-20 – 2022-05-21 (×3): 25 mg via ORAL
  Filled 2022-05-16 (×3): qty 1

## 2022-05-16 MED ORDER — PANTOPRAZOLE SODIUM 40 MG PO TBEC
40.0000 mg | DELAYED_RELEASE_TABLET | Freq: Every day | ORAL | Status: DC
  Administered 2022-05-17 – 2022-05-22 (×6): 40 mg via ORAL
  Filled 2022-05-16 (×6): qty 1

## 2022-05-16 MED ORDER — LACTULOSE 10 GM/15ML PO SOLN
10.0000 g | Freq: Two times a day (BID) | ORAL | Status: DC
  Administered 2022-05-17 – 2022-05-18 (×4): 10 g via ORAL
  Filled 2022-05-16 (×4): qty 15

## 2022-05-16 MED ORDER — ATORVASTATIN CALCIUM 40 MG PO TABS
40.0000 mg | ORAL_TABLET | Freq: Every day | ORAL | Status: DC
  Administered 2022-05-17 – 2022-05-22 (×6): 40 mg via ORAL
  Filled 2022-05-16 (×6): qty 1

## 2022-05-16 MED ORDER — LEVOTHYROXINE SODIUM 150 MCG PO TABS
150.0000 ug | ORAL_TABLET | Freq: Every day | ORAL | Status: DC
  Administered 2022-05-17 – 2022-05-22 (×6): 150 ug via ORAL
  Filled 2022-05-16 (×6): qty 1

## 2022-05-16 MED ORDER — NORTRIPTYLINE HCL 25 MG PO CAPS: 25.0000 mg | ORAL_CAPSULE | Freq: Every evening | ORAL | Status: DC | PRN

## 2022-05-16 MED ORDER — LEVOTHYROXINE SODIUM 50 MCG PO TABS: 150.0000 ug | ORAL_TABLET | Freq: Every day | ORAL | Status: DC

## 2022-05-16 MED ORDER — SODIUM CHLORIDE 0.9 % IV SOLN: 2.0000 g | Freq: Once | INTRAVENOUS | Status: DC

## 2022-05-16 MED ORDER — METHOCARBAMOL 500 MG PO TABS: 500.0000 mg | ORAL_TABLET | Freq: Every day | ORAL | Status: DC

## 2022-05-16 MED ORDER — METOPROLOL SUCCINATE ER 25 MG PO TB24: 12.5000 mg | ORAL_TABLET | Freq: Every day | ORAL | Status: DC | PRN

## 2022-05-16 MED ORDER — ATORVASTATIN CALCIUM 40 MG PO TABS: 40.0000 mg | ORAL_TABLET | Freq: Every morning | ORAL | Status: DC

## 2022-05-16 MED ORDER — MIDODRINE HCL 5 MG PO TABS: 5.0000 mg | ORAL_TABLET | Freq: Every day | ORAL | Status: DC | PRN

## 2022-05-16 MED ORDER — ENOXAPARIN SODIUM 60 MG/0.6ML IJ SOSY
60.0000 mg | PREFILLED_SYRINGE | INTRAMUSCULAR | Status: DC
  Administered 2022-05-17 – 2022-05-21 (×5): 60 mg via SUBCUTANEOUS
  Filled 2022-05-16 (×5): qty 0.6

## 2022-05-16 MED ORDER — VANCOMYCIN HCL 2000 MG/400ML IV SOLN
2000.0000 mg | Freq: Once | INTRAVENOUS | Status: AC
  Administered 2022-05-16: 2000 mg via INTRAVENOUS
  Filled 2022-05-16: qty 400

## 2022-05-16 MED ORDER — LINEZOLID 600 MG PO TABS: 600.0000 mg | ORAL_TABLET | Freq: Two times a day (BID) | ORAL | Status: DC

## 2022-05-16 MED ORDER — SPIRONOLACTONE 25 MG PO TABS: 25.0000 mg | ORAL_TABLET | Freq: Every day | ORAL | Status: DC

## 2022-05-16 MED ORDER — SODIUM CHLORIDE 0.9 % IV SOLN
2.0000 g | Freq: Once | INTRAVENOUS | Status: AC
  Administered 2022-05-16: 2 g via INTRAVENOUS
  Filled 2022-05-16: qty 12.5

## 2022-05-16 MED ORDER — RIFAXIMIN 550 MG PO TABS
550.0000 mg | ORAL_TABLET | Freq: Two times a day (BID) | ORAL | Status: DC
  Administered 2022-05-17 – 2022-05-22 (×11): 550 mg via ORAL
  Filled 2022-05-16 (×11): qty 1

## 2022-05-16 MED ORDER — LACTATED RINGERS IV SOLN
150.0000 mL/h | INTRAVENOUS | Status: AC
  Administered 2022-05-17 (×3): 150 mL/h via INTRAVENOUS

## 2022-05-16 MED ORDER — LACTATED RINGERS IV BOLUS
1000.0000 mL | Freq: Once | INTRAVENOUS | Status: AC
  Administered 2022-05-16: 1000 mL via INTRAVENOUS

## 2022-05-16 NOTE — H&P (Signed)
History and Physical    Patient: [name] JYN:[id number] DOB: [birth year] DOA: 05/16/2022 DOS: the patient was seen and examined on 05/16/2022 PCP: [name], MD  Patient coming from: Home  Chief Complaint:  Chief Complaint  Patient presents with   Altered Mental Status   Dysuria   HPI: [name] is a 67 y.o. female with medical history significant of liver cirrhosis due to NASH, recent AKI due to hypotension, morbid obesity, candidal skin infections, pulmonary embolism but currently not on anticoagulation, chronic diarrhea, essential hypertension and short gut syndrome who was just discharged from the hospital on April 30 after admission with septic shock.  Patient was in the hospital for total of 10 days.  During that hospitalization she was seen by infectious disease as well.  She did have hepatic encephalopathy at the time.  Also anemia with positive fecal blood.  Was seen by GI.  She did have mastitis of the right breast which in addition to the UTI was suspected to be the cause.  Her urine culture at the time also grew for some form of bacteria.  Thought to be also bacteremic from that.  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  Today however she will return with dysuria altered mental status again.  Patient is also meeting sepsis criteria.  She is being readmitted with acute metabolic encephalopathy probably secondary to recurrent UTI.  Her ammonia level is normal.  Review of Systems: As mentioned in the history of present illness. All other systems reviewed and are negative. Past Medical History:  Diagnosis Date   Chronic diarrhea    Cirrhosis, 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   pt stated on admission history review   Heart murmur    Hypertension    Kidney stone    Obesity    Pulmonary embolism (HCC)    Short gut syndrome    Thyroid disease    Past Surgical History:  Procedure Laterality Date   CHOLECYSTECTOMY     COLON SURGERY     HERNIA REPAIR      ILEOSTOMY     ILEOSTOMY CLOSURE     KIDNEY STONE SURGERY     WRIST SURGERY     Social History:  reports that she quit smoking about 37 years ago. Her smoking use included cigarettes. She has been exposed to tobacco smoke. She has never used smokeless tobacco. She reports that she does not drink alcohol and does not use drugs.  Allergies  Allergen Reactions   Ivp Dye [Iodinated Contrast Media] Anaphylaxis   Ace Inhibitors Swelling   Chlorhexidine     Pt has a rash, & made irritation skin further   Doxycycline Rash   Penicillins Rash    Did it involve swelling of the face/tongue/throat, SOB, or low BP? No Did it involve sudden or severe rash/hives, skin peeling, or any reaction on the inside of your mouth or nose? Yes Did you need to seek medical attention at a hospital or doctor's office? No When did it last happen? Several Years Ago      If all above answers are "NO", may proceed with cephalosporin use.     Zofran [Ondansetron] Rash    History reviewed. No pertinent family history.  Prior to Admission medications   Medication Sig Start Date End Date Taking? Authorizing Provider  acetaminophen (TYLENOL) 325 MG tablet Take 650 mg by mouth every 6 (six) hours as needed for moderate pain or headache. Patient used this medication for her headache.  [provider]  allopurinol (ZYLOPRIM) 100 MG tablet Take 100 mg by mouth daily. 12/31/21   [provider]  atorvastatin (LIPITOR) 40 MG tablet Take 40 mg by mouth in the morning. 01/19/22   [provider]  colchicine 0.6 MG tablet Take 0.6 mg by mouth daily as needed (gout).    [provider]  cyanocobalamin (VITAMIN B12) 1000 MCG tablet Take 2,000 mcg by mouth daily.    [provider]  EPINEPHrine 0.3 mg/0.3 mL IJ SOAJ injection Inject 0.3 mg into the muscle as needed for anaphylaxis. 08/07/19   [provider]  furosemide (LASIX) 40 MG tablet Take 1 tablet (40 mg total) by mouth 2  (two) times daily. 05/10/22   Osvaldo Shipper, MD  HYDROcodone-acetaminophen [facility]) 10-325 MG tablet Take 1 tablet by mouth every 6 (six) hours as needed for moderate pain or severe pain. 02/25/22   [provider]  hydrOXYzine (ATARAX/VISTARIL) 25 MG tablet Take 25 mg by mouth 3 (three) times daily as needed for itching.    [provider]  IRON-VITAMIN C PO Take 1 tablet by mouth daily.    [provider]  lactulose (CHRONULAC) 10 GM/15ML solution Take 15 mLs (10 g total) by mouth 2 (two) times daily. 05/10/22   Osvaldo Shipper, MD  levothyroxine (SYNTHROID) 150 MCG tablet Take 150 mcg by mouth daily before breakfast. 10/26/21   [provider]  linezolid (ZYVOX) 600 MG tablet Take 1 tablet (600 mg total) by mouth every 12 (twelve) hours for 6 days. 05/10/22 05/16/22  Osvaldo Shipper, MD  magnesium oxide (MAG-OX) 400 MG tablet Take 400 mg by mouth daily.    [provider]  methocarbamol (ROBAXIN) 500 MG tablet Take 500 mg by mouth daily. 03/04/22   [provider]  metoprolol succinate (TOPROL-XL) 25 MG 24 hr tablet Take 12.5 mg by mouth daily as needed (afib).    [provider]  midodrine (PROAMATINE) 5 MG tablet Take 5 mg by mouth daily as needed (low blood pressure).    [provider]  naloxone [facility]) nasal spray 4 mg/0.1 mL Place 1 spray into the nose as needed (accidental overdose). 03/25/22   [provider]  neomycin-polymyxin b-dexamethasone (MAXITROL) 3.5-10000-0.1 OINT Apply to eye. 04/29/22   [provider]  nortriptyline (PAMELOR) 25 MG capsule Take 25 mg by mouth at bedtime as needed for sleep.    [provider]  NYSTATIN powder Apply 1 Application topically daily as needed (rash). 02/19/15   [provider]  pantoprazole (PROTONIX) 40 MG tablet Take 1 tablet (40 mg total) by mouth daily. 05/10/22   Osvaldo Shipper, MD  potassium chloride (MICRO-K) 10 MEQ CR capsule Take 20 mEq by  mouth 2 (two) times daily. 08/21/14   [provider]  promethazine (PHENERGAN) 12.5 MG tablet Take 12.5 mg by mouth every 6 (six) hours as needed for nausea or vomiting.    [provider]  rifaximin (XIFAXAN) 550 MG TABS tablet Take 550 mg by mouth 2 (two) times daily.    [provider]  spironolactone (ALDACTONE) 25 MG tablet Take 25 mg by mouth daily. 11/08/21   [provider]  triamcinolone cream (KENALOG) 0.5 % Apply to affected area of right breast twice daily as directed. 05/10/22   Osvaldo Shipper, MD  venlafaxine XR (EFFEXOR-XR) 75 MG 24 hr capsule Take 75 mg by mouth daily with breakfast. 08/17/21   [provider]  Vitamin D, Ergocalciferol, (DRISDOL) 50000 UNITS CAPS Take  50,000 Units by mouth every 7 (seven) days.    [provider]    Physical Exam: Vitals:   05/16/22 1851 05/16/22 1902 05/16/22 2200  BP: 106/67  (!) 121/59  Pulse: 92  95  Resp: 17  (!) 23  Temp: 98.3 F (36.8 C)    TempSrc: Oral    SpO2: 100%  98%  Weight:  119.7 kg   Height:  5\' 2"  (1.575 m)    Constitutional: Chronically ill looking, no distress NAD, calm, comfortable Eyes: PERRL, lids and conjunctivae normal ENMT: Mucous membranes are moist. Posterior pharynx clear of any exudate or lesions.Normal dentition.  Neck: normal, supple, no masses, no thyromegaly Respiratory: clear to auscultation bilaterally, no wheezing, no crackles. Normal respiratory effort. No accessory muscle use.  Cardiovascular: Regular rate and rhythm, no murmurs / rubs / gallops. No extremity edema. 2+ pedal pulses. No carotid bruits.  Abdomen: Mildly distended, mild ascites no tenderness, no masses palpated. No hepatosplenomegaly. Bowel sounds positive.  Musculoskeletal: Good range of motion, no joint swelling or tenderness, Skin: no rashes, lesions, ulcers. No induration Neurologic: CN 2-12 grossly intact. Sensation intact, DTR normal. Strength 5/5 in all 4.  Psychiatric:  Confused, no agitation  Data Reviewed:  Temperature is 98.3, blood pressure 120/59, pulse 95 respiratory 23 oxygen sat 98% on room air.  Lactic acid 3.8.  White count 12.7.  Hemoglobin 8.7 platelets 141.  Chloride 112 and creatinine 1.47 BUN 21 calcium 8.4.  Urinalysis showed cloudy urine with large blood.  Also large leukocytes WBC 5-10 few bacteria.  Urine drug screen is negative.  Head CT without contrast negative.  Chest x-ray showed no acute findings.  Assessment and Plan:  #1 sepsis due to UTI: Patient barely meets sepsis criteria with also Lucas cytosis and lactic acidosis.  Will admit the patient and treat for complex UTI.  She has had recent antibiotics for mastitis cellulitis as well as complex UTI.  Also suspected bacteremia although I do think it may be a contaminant.  Initiate Vanco and cefepime for now.  Obtain blood cultures.  Urine cultures.  #2 liver cirrhosis secondary to NASH with anasarca: No clear-cut anasarca at the moment.  Patient is stable.  Ammonia level is normal.  Suspect altered mental status was due to sepsis and UTI.  Continue lactulose from home and monitoring.  #3 acute metabolic encephalopathy: Secondary to sepsis and UTI.  Patient is recovering.  Continue treatment reorientation.  #4 normocytic anemia with thrombocytopenia: Secondary to liver cirrhosis.  Continue to monitor.  #5 AKI: Patient had similar issues last admission.  Creatinine was normal but now down to 1.5 again.  Monitor.  6 recent left ankle surgery: Patient apparently blind in the eye.  Follow-up with ophthalmologist.  #7 hypothyroidism: Continue with levothyroxine.  #8 depression: Continue venlafaxine  #9 morbid obesity: Dietary counseling  #10 history of gout: Continue colchicine as needed.     Advance Care Planning:   Code Status: Full Code   Consults: None at the moment  Family Communication: No family at bedside  Severity of Illness: The appropriate patient status for this  patient is INPATIENT. Inpatient status is judged to be reasonable and necessary in order to provide the required intensity of service to ensure the patient's safety. The patient's presenting symptoms, physical exam findings, and initial radiographic and laboratory data in the context of their chronic comorbidities is felt to place them at high risk for further clinical deterioration. Furthermore, it is not anticipated that the patient will  be medically stable for discharge from the hospital within 2 midnights of admission.   * I certify that at the point of admission it is my clinical judgment that the patient will require inpatient hospital care spanning beyond 2 midnights from the point of admission due to high intensity of service, high risk for further deterioration and high frequency of surveillance required.*  AuthorLonia Blood, MD 05/16/2022 11:08 PM  For on call review [email or web address].

## 2022-05-16 NOTE — ED Triage Notes (Signed)
Pt presents w/ AMS, fatigue, polyuria, and dysuria x2 days.  Pt's son reports Pt was discharged x1 week ago after being admitted for cellulitis and a UTI.  Sts her antibiotic was changed at discharge.    Additionally, Pt has liver issues and son is concerned about her ammonia level.  Sts he has given her lactolose which normally corrects things, but it hasn't this time.

## 2022-05-16 NOTE — ED Provider Notes (Signed)
[place] EMERGENCY DEPARTMENT AT [facility] Provider Note   CSN: [id number] Arrival date & time: 05/16/22  1820     History Chief Complaint  Patient presents with   Altered Mental Status   Dysuria    HPI [name] is a 67 y.o. female presenting for multiple complaints: 67 year old female brought in by son for altered mental status. He states that she has been confused since Thursday.  She has an extensive medical history was just admitted to the hospital last Tuesday for cellulitis causing sepsis. He suspected an elevated ammonia was causing her confusion and somnolence this week and so started her on a as needed lactulose but despite 10-12 episodes of bowel movements per day she is continuing to be altered today.  Brings her in for further care and management.  She has no acute complaints.  She does have a very extensive medical history including recent eye surgery, prior admit for cellulitis and urinary tract infection both leading to sepsis.  She was found to have flava bacterium sepsis during her admission and transferred over to cefadroxil.  Patient's recorded medical, surgical, social, medication list and allergies were reviewed in the Snapshot window as part of the initial history.   Review of Systems   Review of Systems  Unable to perform ROS: Mental status change    Physical Exam Updated Vital Signs BP (!) 121/59   Pulse 95   Temp 98.3 F (36.8 C) (Oral)   Resp (!) 23   Ht 5\' 2"  (1.575 m)   Wt 119.7 kg   SpO2 98%   BMI 48.29 kg/m  Physical Exam Vitals and nursing note reviewed.  Constitutional:      General: She is not in acute distress.    Appearance: She is well-developed.  HENT:     Head: Normocephalic and atraumatic.  Eyes:     Conjunctiva/sclera: Conjunctivae normal.  Cardiovascular:     Rate and Rhythm: Normal rate and regular rhythm.     Heart sounds: No murmur heard. Pulmonary:     Effort: Pulmonary effort is normal. No  respiratory distress.     Breath sounds: Normal breath sounds.  Abdominal:     General: There is no distension.     Palpations: Abdomen is soft.     Tenderness: There is abdominal tenderness. There is no right CVA tenderness or left CVA tenderness.  Musculoskeletal:        General: No swelling or tenderness. Normal range of motion.     Cervical back: Neck supple.  Skin:    General: Skin is warm and dry.  Neurological:     General: No focal deficit present.     Mental Status: She is alert. Mental status is at baseline. She is disoriented.     Cranial Nerves: No cranial nerve deficit.      ED Course/ Medical Decision Making/ A&P Clinical Course as of 05/16/22 2301  Mon May 16, 2022  2005 Brought in by son for AMS since Th Been progressively confused  Been getting her PRN lactulose over the weekend for AMS  [CC]  2006 Admitted for sepsis and cellulitis last week [CC]  2007 Recent eye surgery last week for infection [CC]    Clinical Course User Index [CC] Glyn Ade, MD    Procedures .Critical Care  Performed by: Glyn Ade, MD Authorized by: Glyn Ade, MD   Critical care provider statement:    Critical care time (minutes):  30   Critical care was  necessary to treat or prevent imminent or life-threatening deterioration of the following conditions:  Sepsis   Critical care was time spent personally by me on the following activities:  Development of treatment plan with patient or surrogate, discussions with consultants, evaluation of patient's response to treatment, examination of patient, ordering and review of laboratory studies, ordering and review of radiographic studies, ordering and performing treatments and interventions, pulse oximetry, re-evaluation of patient's condition and review of old charts   I assumed direction of critical care for this patient from another provider in my specialty: no     Care discussed with: admitting provider       Medications Ordered in ED Medications  vancomycin (VANCOREADY) IVPB 2000 mg/400 mL (2,000 mg Intravenous New Bag/Given 05/16/22 2257)  lactated ringers bolus 1,000 mL (1,000 mLs Intravenous New Bag/Given 05/16/22 2210)  ceFEPIme (MAXIPIME) 2 g in sodium chloride 0.9 % 100 mL IVPB (2 g Intravenous New Bag/Given 05/16/22 2207)   Medical Decision Making:   [name] is a 67 y.o. female who presented to the ED today with altered mental status detailed above.    Additional history discussed with patient's family/caregivers.  Patient placed on continuous vitals and telemetry monitoring while in ED which was reviewed periodically.  Complete initial physical exam performed, notably the patient  was mildly disoriented.  Able to answer some questions before falling asleep in the middle of the conversation.  She endorses diffuse abdominal tenderness without any focal tenderness.  States her belly always hurts since her diagnosis of cirrhosis.    Reviewed and confirmed nursing documentation for past medical history, family history, social history.    Initial Assessment:   With the patient's presentation of altered mental status, most likely diagnosis is delerium 2/2 infectious etiology (UTI/CAP/URI) vs metabolic abnormality (Na/K/Mg/Ca) vs nonspecific etiology. Other diagnoses were considered including (but not limited to) CVA, ICH, intracranial mass, critical dehydration, heptatic dysfunction, uremia, hypercarbia, intoxication, endrocrine abnormality, toxidrome. These are considered less likely due to history of present illness and physical exam findings.   This is most consistent with an acute life/limb threatening illness complicated by underlying chronic conditions.  Initial Plan:  CTH to evaluate for intracranial etiology of patient's symptoms  Screening labs including CBC and Metabolic panel to evaluate for infectious or metabolic etiology of disease.  Urinalysis with reflex culture ordered to  evaluate for UTI or relevant urologic/nephrologic pathology.  CXR to evaluate for structural/infectious intrathoracic pathology.  TSH for evaluation for endrocrine etiology EKG to evaluate for cardiac pathology Objective evaluation as below reviewed   Initial Study Results:   Laboratory  I was called to bedside for concerning laboratory findings including lactic acid of 3.8. EKG EKG was reviewed independently. Rate, rhythm, axis, intervals all examined and without medically relevant abnormality. ST segments without concerns for elevations.    Radiology:  All images reviewed independently. Agree with radiology report at this time.   DG Chest Portable 1 View  Result Date: 05/16/2022 CLINICAL DATA:  Shortness of breath and altered mental status. EXAM: PORTABLE CHEST 1 VIEW COMPARISON:  April 30, 2022 FINDINGS: The cardiac silhouette is mildly enlarged and unchanged in size. There is marked severity calcification of the aortic arch. Mild, diffuse, chronic appearing increased lung markings are seen with mild left basilar atelectasis and/or infiltrate. There is no evidence of a pleural effusion or pneumothorax. The visualized skeletal structures are unremarkable. IMPRESSION: Chronic appearing increased lung markings with mild left basilar atelectasis and/or infiltrate. Electronically Signed   By: Waylan Rocher  [place] M.D.   On: 05/16/2022 20:41   CT HEAD WO CONTRAST  Result Date: 05/16/2022 CLINICAL DATA:  Mental status change, unknown cause EXAM: CT HEAD WITHOUT CONTRAST TECHNIQUE: Contiguous axial images were obtained from the base of the skull through the vertex without intravenous contrast. RADIATION DOSE REDUCTION: This exam was performed according to the departmental dose-optimization program which includes automated exposure control, adjustment of the mA and/or kV according to patient size and/or use of iterative reconstruction technique. COMPARISON:  Head CT 01/19/2019, report from head CT  10/07/2021, images unavailable FINDINGS: Brain: No intracranial hemorrhage, mass effect, or midline shift. No hydrocephalus. The basilar cisterns are patent. Normal for age atrophy with similar chronic small vessel ischemia. No evidence of territorial infarct or acute ischemia. No extra-axial or intracranial fluid collection. Vascular: Atherosclerosis of skullbase vasculature without hyperdense vessel or abnormal calcification. Skull: No fracture or focal lesion. Sinuses/Orbits: Paranasal sinuses and mastoid air cells are clear. The visualized orbits are unremarkable. Other: None. IMPRESSION: 1. No acute intracranial abnormality. 2. Normal for age atrophy with chronic small vessel ischemia. Electronically Signed   By: Narda Rutherford M.D.   On: 05/16/2022 20:03   MS US BREAST LTD UNI RIGHT INC AXILLA  Result Date: 05/09/2022 CLINICAL DATA:  Clinical concern for right breast abscess. EXAM: ULTRASOUND OF THE RIGHT BREAST COMPARISON:  Right breast ultrasound on 12/21/2021. Most recent prior mammography dated 08/24/2020. FINDINGS: Targeted ultrasound is performed, showing hyperechoic fat and intervening edema, but no defined collection to suggest an abscess. No mass. IMPRESSION: 1. Findings consistent with mastitis, breast edema/inflammation. 2. No evidence of an abscess. 3. No sonographic evidence of malignancy. RECOMMENDATION: 1. Antibiotic therapy recommended for the presumed right breast mastitis. 2. Follow-up at the breast Center for right breast diagnostic mammography and possible repeat ultrasound in 10-14 days to assess for improvement/resolution. Earlier follow-up recommended if symptoms worsen. I have discussed the findings and recommendations with the patient. If applicable, a reminder letter will be sent to the patient regarding the next appointment. BI-RADS CATEGORY  2: Benign. Electronically Signed   By: Amie Portland M.D.   On: 05/09/2022 12:39   CT CHEST ABDOMEN PELVIS WO CONTRAST  Result Date:  04/30/2022 CLINICAL DATA:  Sepsis.  Cough. EXAM: CT CHEST, ABDOMEN AND PELVIS WITHOUT CONTRAST TECHNIQUE: Multidetector CT imaging of the chest, abdomen and pelvis was performed following the standard protocol without IV contrast. RADIATION DOSE REDUCTION: This exam was performed according to the departmental dose-optimization program which includes automated exposure control, adjustment of the mA and/or kV according to patient size and/or use of iterative reconstruction technique. COMPARISON:  Chest CT dated 02/07/2022. FINDINGS: Evaluation of this exam is limited in the absence of intravenous contrast. CT CHEST FINDINGS Cardiovascular: There is no cardiomegaly or pericardial effusion. Advanced 3 vessel coronary vascular calcification. Mild atherosclerotic calcification of the thoracic aorta. No aneurysmal dilatation. The central pulmonary arteries are grossly unremarkable on this noncontrast CT. Mediastinum/Nodes: No hilar or mediastinal adenopathy. The esophagus is grossly unremarkable. There is a 1.8 cm rim calcified right thyroid nodule. No mediastinal fluid collection. Lungs/Pleura: Trace bilateral pleural effusions. There is minimal associated compressive atelectasis of the left lower lobe. Pneumonia is not excluded. There is no consolidative changes or pneumothorax. The central airways are patent. Musculoskeletal: Osteopenia with degenerative changes of spine. No acute osseous pathology. CT ABDOMEN PELVIS FINDINGS No intra-abdominal free air.  Small ascites. Hepatobiliary: Irregular liver contour consistent with cirrhosis. No biliary dilatation. Cholecystectomy. Pancreas: The pancreas is unremarkable. Spleen: Normal in  size without focal abnormality. Adrenals/Urinary Tract: The adrenal glands are unremarkable. Vascular calcification versus a 3 mm nonobstructing right renal upper pole calculus. No hydronephrosis. The left kidney is unremarkable. The urinary bladder is grossly unremarkable. Stomach/Bowel:  There is postsurgical changes of the bowel with ileocolic anastomosis in the right hemiabdomen. No bowel obstruction. Vascular/Lymphatic: Moderate aortoiliac atherosclerotic disease. The IVC is unremarkable. No portal venous gas. No adenopathy. A tangle of dilated vessels in the abdomen consistent with varices and indicative of portal hypertension. Reproductive: The uterus is anteverted. Multiple calcified fibroids measure up to 6 cm. Other: There is diffuse subcutaneous edema. There is a 2 cm peritoneal defect in the left anterior pelvic wall with herniation of small amount of ascitic fluid. Musculoskeletal: Osteopenia with degenerative changes of the spine. No acute osseous pathology. IMPRESSION: 1. Trace bilateral pleural effusions with minimal associated compressive atelectasis of the left lower lobe. Pneumonia is not excluded. 2. Cirrhosis with evidence of portal hypertension and small ascites. 3. Postsurgical changes of the bowel with ileocolic anastomosis in the right hemiabdomen. No bowel obstruction. 4. Diffuse subcutaneous edema and anasarca. 5.  Aortic Atherosclerosis (ICD10-I70.0). Electronically Signed   By: Elgie Collard M.D.   On: 04/30/2022 21:52   DG Chest Port 1 View  Result Date: 04/30/2022 CLINICAL DATA:  Sepsis EXAM: PORTABLE CHEST 1 VIEW COMPARISON:  X-ray 05/13/2021.  CT scan 02/07/2022 FINDINGS: Underinflation with enlarged cardiopericardial silhouette. Left midlung scar or atelectasis. No consolidation, pneumothorax. Decreasing left effusion. Films are under penetrated. Calcified aorta. Overlapping cardiac leads IMPRESSION: Underinflation. Left midlung scar or atelectasis. Improving left pleural effusion Electronically Signed   By: Karen Kays M.D.   On: 04/30/2022 20:39      Consults: Case discussed with hospitalist.   Final Assessment and Plan:   Patient appears to have a recurrent urinary tract infection causing urosepsis once again she has a slight creatinine increase,  white blood cell increase and significant lactic acid elevation from her prior. Treated with IV fluids antibiotics and arrange for admission for further care and management.    Clinical Impression:  1. Sepsis with acute renal failure without septic shock, due to unspecified organism, unspecified acute renal failure type (HCC)      Admit   Final Clinical Impression(s) / ED Diagnoses Final diagnoses:  Sepsis with acute renal failure without septic shock, due to unspecified organism, unspecified acute renal failure type [facility])    Rx / DC Orders ED Discharge Orders     None         Glyn Ade, MD 05/16/22 2301

## 2022-05-16 NOTE — Progress Notes (Signed)
A consult was received from an ED physician for vancomycin and cefepime per pharmacy dosing.    The patient's profile has been reviewed for ht/wt/allergies/indication/available labs.   A one time order has been placed for cefepime 2 g IV + vancomycin 2000 mg IV.    Further antibiotics/pharmacy consults should be ordered by admitting physician if indicated.                       Thank you, Cindi Carbon, PharmD 05/16/2022  9:47 PM

## 2022-05-16 NOTE — ED Provider Triage Note (Signed)
Emergency Medicine Provider Triage Evaluation Note  [name] , a 67 y.o. female  was evaluated in triage.  Pt complains of altered mental status and dysuria.  Patient was recently hospitalized for sepsis.  She started becoming confused on Saturday afternoon, and has progressively been worsening.  Patient's son states that the patient complained of burning urination yesterday.  She was hospitalized for cellulitis and developed UTI during her stay.  Son states her antibiotic was changed at discharge, and he is unsure if her UTI was not completely resolved.  Patient has cirrhosis of the liver and he is also concerned about her ammonia level.  He has been giving her lactulose, which normally helps, but has not this time.   Review of Systems  Positive: As above Negative: As above  Physical Exam  BP 106/67 (BP Location: Left Arm)   Pulse 92   Temp 98.3 F (36.8 C) (Oral)   Resp 17   Ht 5\' 2"  (1.575 m)   Wt 119.7 kg   SpO2 100%   BMI 48.29 kg/m  Gen:   Awake, oriented to self only, swaying in the chair, appears lethargic Resp:  Normal effort  MSK:   Moves extremities without difficulty  Other:    Medical Decision Making  Medically screening exam initiated at 7:34 PM.  Appropriate orders placed.  Buford Cater was informed that the remainder of the evaluation will be completed by another provider, this initial triage assessment does not replace that evaluation, and the importance of remaining in the ED until their evaluation is complete.     Lenard Simmer, [place] 05/16/22 [phone]

## 2022-05-17 DIAGNOSIS — N179 Acute kidney failure, unspecified: Secondary | ICD-10-CM | POA: Diagnosis not present

## 2022-05-17 DIAGNOSIS — I10 Essential (primary) hypertension: Secondary | ICD-10-CM | POA: Diagnosis not present

## 2022-05-17 DIAGNOSIS — K7460 Unspecified cirrhosis of liver: Secondary | ICD-10-CM

## 2022-05-17 DIAGNOSIS — K7581 Nonalcoholic steatohepatitis (NASH): Secondary | ICD-10-CM | POA: Diagnosis not present

## 2022-05-17 DIAGNOSIS — A419 Sepsis, unspecified organism: Secondary | ICD-10-CM | POA: Diagnosis not present

## 2022-05-17 DIAGNOSIS — R6520 Severe sepsis without septic shock: Secondary | ICD-10-CM

## 2022-05-17 LAB — CBC
HCT: 25.9 % — ABNORMAL LOW (ref 36.0–46.0)
Hemoglobin: 7.9 g/dL — ABNORMAL LOW (ref 12.0–15.0)
MCH: 29.9 pg (ref 26.0–34.0)
MCHC: 30.5 g/dL (ref 30.0–36.0)
MCV: 98.1 fL (ref 80.0–100.0)
Platelets: 115 10*3/uL — ABNORMAL LOW (ref 150–400)
RBC: 2.64 MIL/uL — ABNORMAL LOW (ref 3.87–5.11)
RDW: 22.1 % — ABNORMAL HIGH (ref 11.5–15.5)
WBC: 10.1 10*3/uL (ref 4.0–10.5)
nRBC: 0 % (ref 0.0–0.2)

## 2022-05-17 LAB — COMPREHENSIVE METABOLIC PANEL
ALT: 14 U/L (ref 0–44)
AST: 20 U/L (ref 15–41)
Albumin: 2.4 g/dL — ABNORMAL LOW (ref 3.5–5.0)
Alkaline Phosphatase: 73 U/L (ref 38–126)
Anion gap: 8 (ref 5–15)
BUN: 20 mg/dL (ref 8–23)
CO2: 20 mmol/L — ABNORMAL LOW (ref 22–32)
Calcium: 7.9 mg/dL — ABNORMAL LOW (ref 8.9–10.3)
Chloride: 107 mmol/L (ref 98–111)
Creatinine, Ser: 1.26 mg/dL — ABNORMAL HIGH (ref 0.44–1.00)
GFR, Estimated: 48 mL/min — ABNORMAL LOW (ref >60)
Glucose, Bld: 101 mg/dL — ABNORMAL HIGH (ref 70–99)
Potassium: 4.9 mmol/L (ref 3.5–5.1)
Sodium: 135 mmol/L (ref 135–145)
Total Bilirubin: 2.6 mg/dL — ABNORMAL HIGH (ref 0.3–1.2)
Total Protein: 5.5 g/dL — ABNORMAL LOW (ref 6.5–8.1)

## 2022-05-17 LAB — PROCALCITONIN: Procalcitonin: 0.13 ng/mL

## 2022-05-17 LAB — LACTIC ACID, PLASMA
Lactic Acid, Venous: 3.3 mmol/L (ref 0.5–1.9)
Lactic Acid, Venous: 2.2 mmol/L (ref 0.5–1.9)

## 2022-05-17 LAB — CULTURE, BLOOD (ROUTINE X 2): Culture: NO GROWTH

## 2022-05-17 LAB — PROTIME-INR
INR: 2.1 — ABNORMAL HIGH (ref 0.8–1.2)
Prothrombin Time: 23.7 seconds — ABNORMAL HIGH (ref 11.4–15.2)

## 2022-05-17 LAB — CORTISOL-AM, BLOOD: Cortisol - AM: 4.5 ug/dL — ABNORMAL LOW (ref 6.7–22.6)

## 2022-05-17 MED ORDER — COSYNTROPIN 0.25 MG IJ SOLR
0.2500 mg | Freq: Once | INTRAMUSCULAR | Status: AC
  Administered 2022-05-18: 0.25 mg via INTRAVENOUS
  Filled 2022-05-17: qty 0.25

## 2022-05-17 MED ORDER — SODIUM CHLORIDE 0.9 % IV SOLN
2.0000 g | Freq: Two times a day (BID) | INTRAVENOUS | Status: DC
  Administered 2022-05-17 – 2022-05-20 (×7): 2 g via INTRAVENOUS
  Filled 2022-05-17 (×7): qty 12.5

## 2022-05-17 MED ORDER — VANCOMYCIN HCL 750 MG/150ML IV SOLN
750.0000 mg | INTRAVENOUS | Status: DC
  Administered 2022-05-17: 750 mg via INTRAVENOUS
  Filled 2022-05-17: qty 150

## 2022-05-17 MED ORDER — ZINC OXIDE 40 % EX OINT
TOPICAL_OINTMENT | Freq: Four times a day (QID) | CUTANEOUS | Status: DC
  Administered 2022-05-17: 1 via TOPICAL
  Filled 2022-05-17 (×2): qty 57

## 2022-05-17 NOTE — Evaluation (Signed)
Physical Therapy Evaluation Patient Details Name: [name] MRN: [id number] DOB: Dec 08, 1955 Today's Date: 05/17/2022  History of Present Illness  67 y.o. female past medical history significant for liver cirrhosis NASH, morbid obesity Candida infection pulmonary embolism not on anticoagulation, short blood syndrome was discharged from the hospital on 05/10/2022 from septic shock due to mastitis in addition to possible UTI comes in today for altered mental status and dysuria. Dx of sepsis, UTI.  Clinical Impression  Pt admitted with above diagnosis. Min assist for supine to sit, min/guard for transfer to bedside commode, then to recliner. Pt noted to be soiled with very loose, liquid BM, assisted pt with pericare.  Pt currently with functional limitations due to the deficits listed below (see PT Problem List). Pt will benefit from acute skilled PT to increase their independence and safety with mobility to allow discharge.          Recommendations for follow up therapy are one component of a multi-disciplinary discharge planning process, led by the attending physician.  Recommendations may be updated based on patient status, additional functional criteria and insurance authorization.  Follow Up Recommendations       Assistance Recommended at Discharge Intermittent Supervision/Assistance  Patient can return home with the following  A little help with walking and/or transfers;A little help with bathing/dressing/bathroom;Assistance with cooking/housework;Assist for transportation;Help with stairs or ramp for entrance    Equipment Recommendations None recommended by PT  Recommendations for Other Services       Functional Status Assessment Patient has had a recent decline in their functional status and demonstrates the ability to make significant improvements in function in a reasonable and predictable amount of time.     Precautions / Restrictions Precautions Precautions: Fall Precaution  Comments: denies falls in past 6 months Restrictions Weight Bearing Restrictions: No      Mobility  Bed Mobility Overal bed mobility: Needs Assistance Bed Mobility: Supine to Sit     Supine to sit: Min assist, HOB elevated     General bed mobility comments: assist to raise trunk, VCs for technique, increased time, used bedrail; bed noted to be soiled with BM, NT notified    Transfers Overall transfer level: Needs assistance Equipment used: Rolling walker (2 wheels) Transfers: Sit to/from Stand, Bed to chair/wheelchair/BSC Sit to Stand: Min guard   Step pivot transfers: Min guard       General transfer comment: VCs hand placement, min/guard for safety; SPT x 2 bed to 3 in 1 to recliner with RW. Pt able to stand for ~90 seconds for pericare following BM.    Ambulation/Gait               General Gait Details: deferred 2* fatigue  Stairs            Wheelchair Mobility    Modified Rankin (Stroke Patients Only)       Balance Overall balance assessment: Needs assistance Sitting-balance support: No upper extremity supported, Feet supported       Standing balance support: Bilateral upper extremity supported, Reliant on assistive device for balance, During functional activity Standing balance-Leahy Scale: Poor                               Pertinent Vitals/Pain Pain Assessment Faces Pain Scale: Hurts little more Pain Location: buttocks Pain Descriptors / Indicators: Burning, Sore Pain Intervention(s): Limited activity within patient's tolerance, Monitored during session, Repositioned    Home Living Family/patient  expects to be discharged to:: Private residence Living Arrangements: Children Available Help at Discharge: Family;Available 24 hours/day Type of Home: House Home Access: Ramped entrance         Home Equipment: Rollator (4 wheels);Rolling Walker (2 wheels);BSC/3in1;Other (comment);Hospital bed Additional Comments: lives  with [name]    Prior Function Prior Level of Function : Independent/Modified Independent             Mobility Comments: uses rollator when going out, no AD in the home, denies falls in past 6 months ADLs Comments: Pt reports that she can normally do her ADL on her own. Uses BSC, sponge bathes. Son assists with IADL and occasionally socks/shoes     Hand Dominance        Extremity/Trunk Assessment   Upper Extremity Assessment Upper Extremity Assessment: Overall WFL for tasks assessed    Lower Extremity Assessment Lower Extremity Assessment: Overall WFL for tasks assessed (B knee ext 5/5)    Cervical / Trunk Assessment Cervical / Trunk Assessment: Normal  Communication   Communication: No difficulties  Cognition Arousal/Alertness: Awake/alert Behavior During Therapy: WFL for tasks assessed/performed Overall Cognitive Status: Within Functional Limits for tasks assessed                                          General Comments      Exercises General Exercises - Lower Extremity Ankle Circles/Pumps: AROM, Both, 10 reps, Seated Long Arc Quad: AROM, Both, 10 reps, Seated Hip Flexion/Marching: AROM, Both, 10 reps, Seated   Assessment/Plan    PT Assessment Patient needs continued PT services  PT Problem List Decreased activity tolerance;Decreased mobility;Obesity       PT Treatment Interventions Gait training;Therapeutic activities;Balance training;Functional mobility training;Therapeutic exercise    PT Goals (Current goals can be found in the Care Plan section)  Acute Rehab PT Goals Patient Stated Goal: to travel to [facility] with her son in a couple weeks PT Goal Formulation: With patient/family Time For Goal Achievement: 05/31/22 Potential to Achieve Goals: Good    Frequency Min 1X/week     Co-evaluation               AM-PAC PT "6 Clicks" Mobility  Outcome Measure Help needed turning from your back to your side while in a flat bed  without using bedrails?: A Little Help needed moving from lying on your back to sitting on the side of a flat bed without using bedrails?: A Little Help needed moving to and from a bed to a chair (including a wheelchair)?: A Little Help needed standing up from a chair using your arms (e.g., wheelchair or bedside chair)?: A Little Help needed to walk in hospital room?: A Little Help needed climbing 3-5 steps with a railing? : A Little 6 Click Score: 18    End of Session Equipment Utilized During Treatment: Gait belt Activity Tolerance: Patient limited by fatigue Patient left: with call bell/phone within reach;with family/visitor present;in chair;with chair alarm set;with nursing/sitter in room Nurse Communication: Mobility status PT Visit Diagnosis: Difficulty in walking, not elsewhere classified (R26.2)    Time: 1610-9604 PT Time Calculation (min) (ACUTE ONLY): 19 min   Charges:   PT Evaluation $PT Eval Moderate Complexity: 1 Mod         Tamala Ser PT 05/17/2022  Acute Rehabilitation Services  Office [phone]

## 2022-05-17 NOTE — Progress Notes (Signed)
Pharmacy Antibiotic Note  [name] is a 67 y.o. female admitted on 05/16/2022 with sepsis.  Pharmacy has been consulted for Cefepime + Vancomycin dosing.  She was recently admitted 4/20>>4/30 with septic shock due to mastitis cellulitis and Klebsiella UTI.  She completed course of Cefepime>>cefadroxil during hospitalization.  Additionally she had Vanc IV>.Zyvox PO which she was discharged on and should have completed course 5/5.   Scr 1.47: elevated above patient's baseline of ~0.9.  Plan: Cefepime 2gm IV q12h Vancomycin 750mg  IV q24h to target AUC 400-550.  Estimated AUC on this regimen 420. Monitor renal function and cx data   Height: 5\' 2"  (157.5 cm) Weight: 119.7 kg (264 lb) IBW/kg (Calculated) : 50.1  Temp (24hrs), Avg:98.3 F (36.8 C), Min:98.3 F (36.8 C), Max:98.3 F (36.8 C)  Recent Labs  Lab 05/10/22 0429 05/16/22 2020  WBC 5.4 12.7*  CREATININE 0.91 1.47*  LATICACIDVEN  --  3.8*    Estimated Creatinine Clearance: 47.6 mL/min (A) (by C-G formula based on SCr of 1.47 mg/dL (H)).    Allergies  Allergen Reactions   Ivp Dye [Iodinated Contrast Media] Anaphylaxis   Ace Inhibitors Swelling   Chlorhexidine     Pt has a rash, & made irritation skin further   Doxycycline Rash   Penicillins Rash    Did it involve swelling of the face/tongue/throat, SOB, or low BP? No Did it involve sudden or severe rash/hives, skin peeling, or any reaction on the inside of your mouth or nose? Yes Did you need to seek medical attention at a hospital or doctor's office? No When did it last happen? Several Years Ago      If all above answers are "NO", may proceed with cephalosporin use.     Zofran [Ondansetron] Rash    Antimicrobials this admission: 5/6 Cefepime >>  5/6 Vancomycin >>   Dose adjustments this admission:  Microbiology results: 5/6 BCx:  UCx:    Thank you for allowing pharmacy to be a part of this patient's care.  Junita Push PharmD 05/17/2022 12:25  AM

## 2022-05-17 NOTE — Sepsis Progress Note (Signed)
Followed for sepsis monitoring

## 2022-05-17 NOTE — Progress Notes (Addendum)
[facility] PROGRESS NOTE    Progress Note  [name]  WUJ:[id number] DOB: July 10, 1955 DOA: 05/16/2022 PCP: [name], MD     Brief Narrative:   [name] is an 67 y.o. female past medical history significant for liver cirrhosis NASH, morbid obesity Candida infection pulmonary embolism not on anticoagulation, short blood syndrome was discharged from the hospital on 05/10/2022 from septic shock due to mastitis in addition to possible UTI comes in today for altered mental status and dysuria    Assessment/Plan:   Sepsis secondary to UTI (HCC) Blood pressure is borderline she is being fluid resuscitated, I's and O's and poorly recorded. She had no leukocytosis she was started on IV Vanco and cefepime. Continue strict I's and O's and daily weights. Culture data has remained negative till date. Cortisol this morning is 4.5, will perform cosyntropin test.  Acute kidney injury: Baseline creatinine of less than 1, admission 1.4 she is being fluid resuscitated, this morning her creatinine 1.2. Continue IV fluids recheck basic metabolic panel in the morning. Judicious with IV fluids as she has liver cirrhosis. Hold Lasix and Aldactone in the setting of acute kidney injury.  Possible relative adrenal insufficiency: Cortisol level this morning is low, check cosyntropin test.  Liver cirrhosis secondary to NASH [facility]): She has no asterixis on physical exam.  INR 1.2 Diuretic therapy were held. Resume lactulose and rifaximin  Acute metabolic encephalopathy: Likely due to infectious etiology. Still confused this morning continue lactulose  and rifaximin.  Normocytic anemia/thrombocytopenia: Likely due to liver cirrhosis. No signs of overt bleeding. Continue to monitor.  Hypothyroidism: Continue Synthroid.  Depression: Continue venlafaxine  Morbid obesity: She has been counseled.    DVT prophylaxis: lovenox Family Communication:none Status is:  Inpatient Remains inpatient appropriate because: Sepsis due to UTI    Code Status:     Code Status Orders  (From admission, onward)           Start     Ordered   05/16/22 2306  Full code  Continuous       Question:  By:  Answer:  Consent: discussion documented in EHR   05/16/22 2308           Code Status History     Date Active Date Inactive Code Status Order ID Comments User Context   05/01/2022 0022 05/10/2022 2002 Full Code [id number]  Anselm Jungling, DO ED      Advance Directive Documentation    Flowsheet Row Most Recent Value  Type of Advance Directive Living will  Pre-existing out of facility DNR order (yellow form or pink MOST form) --  "MOST" Form in Place? --         IV Access:   Peripheral IV   Procedures and diagnostic studies:   DG Chest Portable 1 View  Result Date: 05/16/2022 CLINICAL DATA:  Shortness of breath and altered mental status. EXAM: PORTABLE CHEST 1 VIEW COMPARISON:  April 30, 2022 FINDINGS: The cardiac silhouette is mildly enlarged and unchanged in size. There is marked severity calcification of the aortic arch. Mild, diffuse, chronic appearing increased lung markings are seen with mild left basilar atelectasis and/or infiltrate. There is no evidence of a pleural effusion or pneumothorax. The visualized skeletal structures are unremarkable. IMPRESSION: Chronic appearing increased lung markings with mild left basilar atelectasis and/or infiltrate. Electronically Signed   By: Aram Candela M.D.   On: 05/16/2022 20:41   CT HEAD WO CONTRAST  Result Date: 05/16/2022 CLINICAL DATA:  Mental status  change, unknown cause EXAM: CT HEAD WITHOUT CONTRAST TECHNIQUE: Contiguous axial images were obtained from the base of the skull through the vertex without intravenous contrast. RADIATION DOSE REDUCTION: This exam was performed according to the departmental dose-optimization program which includes automated exposure control, adjustment of the mA and/or  kV according to patient size and/or use of iterative reconstruction technique. COMPARISON:  Head CT 01/19/2019, report from head CT 10/07/2021, images unavailable FINDINGS: Brain: No intracranial hemorrhage, mass effect, or midline shift. No hydrocephalus. The basilar cisterns are patent. Normal for age atrophy with similar chronic small vessel ischemia. No evidence of territorial infarct or acute ischemia. No extra-axial or intracranial fluid collection. Vascular: Atherosclerosis of skullbase vasculature without hyperdense vessel or abnormal calcification. Skull: No fracture or focal lesion. Sinuses/Orbits: Paranasal sinuses and mastoid air cells are clear. The visualized orbits are unremarkable. Other: None. IMPRESSION: 1. No acute intracranial abnormality. 2. Normal for age atrophy with chronic small vessel ischemia. Electronically Signed   By: Narda Rutherford M.D.   On: 05/16/2022 20:03     Medical Consultants:   None.   Subjective:    [name] awake this morning she does not know where she is at  Objective:    Vitals:   05/16/22 1902 05/16/22 2200 05/17/22 0050 05/17/22 0506  BP:  (!) 121/59 113/61 92/74  Pulse:  95 78 91  Resp:  (!) 23 18 18   Temp:   97.7 F (36.5 C) 98.2 F (36.8 C)  TempSrc:   Oral Oral  SpO2:  98% 100% 100%  Weight: 119.7 kg     Height: 5\' 2"  (1.575 m)      SpO2: 100 %   Intake/Output Summary (Last 24 hours) at 05/17/2022 0981 Last data filed at 05/17/2022 1914 Gross per 24 hour  Intake --  Output 215 ml  Net -215 ml   Filed Weights   05/16/22 1902  Weight: 119.7 kg    Exam: General exam: In no acute distress dry mucous membrane Respiratory system: Good air movement and clear to auscultation. Cardiovascular system: S1 & S2 heard, RRR. No JVD.  Gastrointestinal system: Positive sounds soft nondistended suprapubic pain Extremities: No pedal edema. Skin: No rashes, lesions or ulcers Psychiatry: No judgment or insight of medical condition  this morning.   Data Reviewed:    Labs: Basic Metabolic Panel: Recent Labs  Lab 05/16/22 2020 05/17/22 0231  NA 140 135  K 4.8 4.9  CL 112* 107  CO2 20* 20*  GLUCOSE 123* 101*  BUN 21 20  CREATININE 1.47* 1.26*  CALCIUM 8.4* 7.9*   GFR Estimated Creatinine Clearance: 55.5 mL/min (A) (by C-G formula based on SCr of 1.26 mg/dL (H)). Liver Function Tests: Recent Labs  Lab 05/16/22 2020 05/17/22 0231  AST 19 20  ALT 16 14  ALKPHOS 84 73  BILITOT 2.3* 2.6*  PROT 6.2* 5.5*  ALBUMIN 2.5* 2.4*   No results for input(s): "LIPASE", "AMYLASE" in the last 168 hours. Recent Labs  Lab 05/16/22 2020  AMMONIA 22   Coagulation profile Recent Labs  Lab 05/17/22 0231  INR 2.1*   COVID-19 Labs  No results for input(s): "DDIMER", "FERRITIN", "LDH", "CRP" in the last 72 hours.  Lab Results  Component Value Date   SARSCOV2NAA NEGATIVE 01/11/2021    CBC: Recent Labs  Lab 05/16/22 2020 05/17/22 0231  WBC 12.7* 10.1  NEUTROABS 9.1*  --   HGB 8.7* 7.9*  HCT 28.2* 25.9*  MCV 95.3 98.1  PLT 141* 115*  Cardiac Enzymes: No results for input(s): "CKTOTAL", "CKMB", "CKMBINDEX", "TROPONINI" in the last 168 hours. BNP (last 3 results) No results for input(s): "PROBNP" in the last 8760 hours. CBG: Recent Labs  Lab 05/16/22 2024  GLUCAP 108*   D-Dimer: No results for input(s): "DDIMER" in the last 72 hours. Hgb A1c: No results for input(s): "HGBA1C" in the last 72 hours. Lipid Profile: No results for input(s): "CHOL", "HDL", "LDLCALC", "TRIG", "CHOLHDL", "LDLDIRECT" in the last 72 hours. Thyroid function studies: No results for input(s): "TSH", "T4TOTAL", "T3FREE", "THYROIDAB" in the last 72 hours.  Invalid input(s): "FREET3" Anemia work up: No results for input(s): "VITAMINB12", "FOLATE", "FERRITIN", "TIBC", "IRON", "RETICCTPCT" in the last 72 hours. Sepsis Labs: Recent Labs  Lab 05/16/22 2020 05/16/22 2305 05/17/22 0231  PROCALCITON  --   --  0.13  WBC  12.7*  --  10.1  LATICACIDVEN 3.8* 3.3* 2.2*   Microbiology No results found for this or any previous visit (from the past 240 hour(s)).   Medications:    atorvastatin  40 mg Oral Daily   enoxaparin (LOVENOX) injection  60 mg Subcutaneous Q24H   furosemide  40 mg Oral BID   lactulose  10 g Oral BID   levothyroxine  150 mcg Oral Q0600   magnesium oxide  400 mg Oral Daily   methocarbamol  500 mg Oral Daily   pantoprazole  40 mg Oral Daily   rifaximin  550 mg Oral BID   spironolactone  25 mg Oral Daily   venlafaxine XR  75 mg Oral Q breakfast   Continuous Infusions:  ceFEPime (MAXIPIME) IV     lactated ringers 150 mL/hr (05/17/22 0047)   vancomycin        LOS: 1 day   Marinda Elk  [facility]  05/17/2022, 7:22 AM

## 2022-05-17 NOTE — Consult Note (Signed)
WOC Nurse Consult Note: Reason for Consult:irritant contact dermatitis to buttocks, medial and posterior thighs Wound type:irritant contact dermatitis  ICD-10 CM Codes for Irritant Dermatitis  L24A2 - Due to fecal, urinary or dual incontinence L24A9 - Due to friction or contact with other specified body fluids L30.4  - Erythema intertrigo. Also used for abrasion of the hand, chafing of the skin, dermatitis due to sweating and friction, friction dermatitis, friction eczema, and genital/thigh intertrigo.   Pressure Injury POA:N/A Wound AOZ:HYQM, moist Drainage (amount, consistency, odor) serous Periwound:erythematous Dressing procedure/placement/frequency: I will provide a mattress replacement for microclimate management and add Desitin ointment to areas of ITD.  InterDry to inframammary areas.  If desired, consider adding systemic antifungal, e.g., Diflucan.  WOC nursing team will not follow, but will remain available to this patient, the nursing and medical teams.  Please re-consult if needed.  Thank you for inviting [place] to participate in this patient's Plan of Care.  Ladona Mow, MSN, RN, CNS, GNP, Leda Min, [employer], [employer] phone:  [phone]

## 2022-05-18 DIAGNOSIS — A419 Sepsis, unspecified organism: Secondary | ICD-10-CM | POA: Diagnosis not present

## 2022-05-18 DIAGNOSIS — N390 Urinary tract infection, site not specified: Secondary | ICD-10-CM | POA: Diagnosis not present

## 2022-05-18 LAB — CBC
HCT: 27.4 % — ABNORMAL LOW (ref 36.0–46.0)
Hemoglobin: 8.3 g/dL — ABNORMAL LOW (ref 12.0–15.0)
MCH: 29.6 pg (ref 26.0–34.0)
MCHC: 30.3 g/dL (ref 30.0–36.0)
MCV: 97.9 fL (ref 80.0–100.0)
Platelets: 101 10*3/uL — ABNORMAL LOW (ref 150–400)
RBC: 2.8 MIL/uL — ABNORMAL LOW (ref 3.87–5.11)
RDW: 21.3 % — ABNORMAL HIGH (ref 11.5–15.5)
WBC: 7.9 10*3/uL (ref 4.0–10.5)
nRBC: 0 % (ref 0.0–0.2)

## 2022-05-18 LAB — BASIC METABOLIC PANEL
Anion gap: 8 (ref 5–15)
BUN: 25 mg/dL — ABNORMAL HIGH (ref 8–23)
CO2: 20 mmol/L — ABNORMAL LOW (ref 22–32)
Calcium: 8.3 mg/dL — ABNORMAL LOW (ref 8.9–10.3)
Chloride: 111 mmol/L (ref 98–111)
Creatinine, Ser: 1.22 mg/dL — ABNORMAL HIGH (ref 0.44–1.00)
GFR, Estimated: 50 mL/min — ABNORMAL LOW (ref >60)
Glucose, Bld: 93 mg/dL (ref 70–99)
Potassium: 4.7 mmol/L (ref 3.5–5.1)
Sodium: 139 mmol/L (ref 135–145)

## 2022-05-18 LAB — URINE CULTURE

## 2022-05-18 LAB — CULTURE, BLOOD (ROUTINE X 2)

## 2022-05-18 MED ORDER — ALBUMIN HUMAN 25 % IV SOLN
25.0000 g | Freq: Four times a day (QID) | INTRAVENOUS | Status: AC
  Administered 2022-05-18 (×2): 25 g via INTRAVENOUS
  Filled 2022-05-18 (×2): qty 100

## 2022-05-18 MED ORDER — FLUCONAZOLE 100 MG PO TABS
100.0000 mg | ORAL_TABLET | Freq: Every day | ORAL | Status: DC
  Administered 2022-05-18 – 2022-05-22 (×5): 100 mg via ORAL
  Filled 2022-05-18 (×5): qty 1

## 2022-05-18 NOTE — TOC Initial Note (Signed)
Transition of Care [facility]) - Initial/Assessment Note    Patient Details  Name: [name] MRN: [id number] Date of Birth: 04-14-1955  Transition of Care [facility]) CM/SW Contact:    Durenda Guthrie, RN Phone Number: 05/18/2022, 10:30 AM  Clinical Narrative:                 Case Manager spoke with patient's son, Markeshia Manbeck concerning need for Home Health therapies. He states patient is active with [facility]. CM contacted Duwaine Maxin, [facility] Liaison to update on patient's admission. [name] states his mom lives with him and will have support at discharge. No DME needs.   Expected Discharge Plan: Home w Home Health Services Barriers to Discharge: Continued Medical Work up   Patient Goals and CMS Choice     Choice offered to / list presented to : Adult Children      Expected Discharge Plan and Services   Discharge Planning Services: CM Consult Post Acute Care Choice: Resumption of Svcs/PTA Provider Living arrangements for the past 2 months: Single Family Home                 DME Arranged: N/A DME Agency: NA       HH Arranged: PT, OT   Date HH Agency Contacted: 05/18/22 Time HH Agency Contacted: 1029 Representative spoke with at [facility] Agency: [name] (CM informed [name] pt in hospital)  Prior Living Arrangements/Services Living arrangements for the past 2 months: Single Family Home Lives with:: Adult Children Patient language and need for interpreter reviewed:: Yes Do you feel safe going back to the place where you live?: Yes      Need for Family Participation in Patient Care: Yes (Comment) Care giver support system in place?: Yes (comment) Current home services: Home PT Criminal Activity/Legal Involvement Pertinent to Current Situation/Hospitalization: No - Comment as needed  Activities of Daily Living      Permission Sought/Granted         Permission granted to share info w AGENCY: [facility]        Emotional Assessment         Alcohol / Substance Use: Not  Applicable Psych Involvement: No (comment)  Admission diagnosis:  Sepsis secondary to UTI (HCC) [A41.9, N39.0] Sepsis with acute renal failure without septic shock, due to unspecified organism, unspecified acute renal failure type (HCC) [A41.9, R65.20, N17.9] Patient Active Problem List   Diagnosis Date Noted   Iron deficiency anemia 05/16/2022   Thrombocytopenia (HCC) 05/16/2022   Sepsis secondary to UTI (HCC) 05/16/2022   Sepsis (HCC) 05/01/2022   Candidal skin infection 05/01/2022   History of eye surgery 05/01/2022   Obesity, Class III, BMI 40-49.9 (morbid obesity) (HCC) 05/01/2022   Sepsis due to cellulitis (HCC) 04/30/2022   Hypotension 04/30/2022   Liver cirrhosis secondary to NASH (HCC) 04/30/2022   AKI (acute kidney injury) (HCC) 04/30/2022   GERD (gastroesophageal reflux disease) 07/25/2016   Benign essential hypertension 05/04/2015   PCP:  [name], MD Pharmacy:   [id number] - [place], [place] - [address] [place] [address] Phone: [phone] Fax: [phone]  [id number], [place] - [address] AT [facility] [address] [place] [address] Phone: [phone] Fax: [phone]  [facility] [address] [place] [address] Phone: [phone] Fax: [phone]     Social Determinants of Health (SDOH) Social History: SDOH Screenings  Food Insecurity: No Food Insecurity (05/01/2022)  Housing: Low Risk  (05/01/2022)  Transportation Needs: No Transportation Needs (05/01/2022)  Utilities: Not At Risk (05/01/2022)  Tobacco Use: Medium Risk (05/16/2022)   SDOH Interventions:     Readmission Risk Interventions    05/18/2022   10:24 AM 05/06/2022    3:39 PM  Readmission Risk Prevention Plan  Transportation Screening Complete Complete  PCP or Specialist Appt within 5-7 Days Complete Complete  Home Care Screening Complete Complete  Medication Review (RN CM)  Complete Complete

## 2022-05-18 NOTE — Progress Notes (Signed)
PROGRESS NOTE  [name]:[id number] DOB: 1956/01/10 DOA: 05/16/2022 PCP: [name], MD   LOS: 2 days   Brief Narrative / Interim history: 67 year old female with Nash cirrhosis, HTN, prior PE/DVT currently not on anticoagulation comes into the hospital with complaints of confusion, was found to have a UTI. 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  She was recently hospitalized 4/20-4/30 2024 with right breast mastitis/UTI, septic shock, improved.  Subjective / 24h Interval events: About to eat breakfast.  States that she is feeling well this morning.  Does not have much recollection as to how she came to the hospital.  Appears to be alert and oriented x 4  Assesement and Plan: Principal Problem:   Sepsis secondary to UTI [facility]) Active Problems:   Liver cirrhosis secondary to NASH (HCC)   AKI (acute kidney injury) (HCC)   Obesity, Class III, BMI 40-49.9 (morbid obesity) (HCC)   Iron deficiency anemia   Benign essential hypertension   GERD (gastroesophageal reflux disease)   Thrombocytopenia (HCC)   Principal problem Sepsis due to UTI -she had leukocytosis as well as elevated lactic acid.  She has been placed on antibiotics with vancomycin and cefepime, urine culture preliminarily this morning with gram-negative's, discontinue vancomycin and keep on cefepime alone.  Follow speciation/sensitivities  Active problems Liver cirrhosis due to NASH, hepatic encephalopathy -patient was confused on admission in the setting of UTI.  Confusion much better this morning.  Home Lasix and spironolactone are now on hold.  Hold lactulose due to diarrhea, continue rifaximin alone  Acute kidney injury -possibly in the setting of using diuretics at home.  She was given fluid, renal function appears to be improving, today's labs are pending.  Creatinine was 1.47 on admission from 0.9 at the time of discharge.  Home diuretics are on  hold.  Anasarca -appears slightly intravascularly depleted, no significant lower extremity edema.  Will give albumin today.  Continue midodrine for blood pressure support and for now hold diuretics  Normocytic anemia/thrombocytopenia  -this is likely in the setting of underlying liver disease.  She did require blood transfusion during her prior hospitalization, GI was consulted at that time without recommending interventions but to monitor and continue Protonix.  Possible adrenal insufficiency-labs not collected this morning as per protocol.  Recheck stim test tomorrow morning  Recent mastitis involving right breast -Better  Recent history of eye surgery - Blind in left eye with recent gunderson flap surgery on 4/19 with [facility] ophthalmology.  Continue antibiotic ophthalmic ointment, ketorolac eye drop    Hypothyroidism On Synthyroid   History of depression - Venlafaxine   Morbid obesity -BMI 48.  She would benefit from weight loss   Scheduled Meds:  atorvastatin  40 mg Oral Daily   enoxaparin (LOVENOX) injection  60 mg Subcutaneous Q24H   lactulose  10 g Oral BID   levothyroxine  150 mcg Oral Q0600   liver oil-zinc oxide   Topical QID   magnesium oxide  400 mg Oral Daily   pantoprazole  40 mg Oral Daily   rifaximin  550 mg Oral BID   venlafaxine XR  75 mg Oral Q breakfast   Continuous Infusions:  albumin human     ceFEPime (MAXIPIME) IV 2 g (05/18/22 0832)   PRN Meds:.hydrOXYzine, metoprolol succinate, midodrine, nortriptyline  Current Outpatient Medications  Medication Instructions   acetaminophen (TYLENOL) 650 mg, Oral, Every 6 hours PRN, Patient used this medication  for her headache.    allopurinol (ZYLOPRIM) 100 mg, Oral, Daily   atorvastatin (LIPITOR) 40 mg, Oral, Every morning   colchicine 0.6 mg, Oral, Daily PRN   Constulose 10 g, Oral, 2 times daily   cyanocobalamin (VITAMIN B12) 2,000 mcg, Oral, Daily   EPINEPHrine (EPI-PEN) 0.3 mg, Intramuscular, As needed    furosemide (LASIX) 40 mg, Oral, 2 times daily   HYDROcodone-acetaminophen (NORCO) 10-325 MG tablet 1 tablet, Oral, Every 6 hours PRN   hydrOXYzine (ATARAX) 25 mg, Oral, 3 times daily PRN   IRON-VITAMIN C PO 1 tablet, Oral, Daily   levothyroxine (SYNTHROID) 150 mcg, Oral, Daily before breakfast   magnesium oxide (MAG-OX) 400 mg, Oral, Daily   methocarbamol (ROBAXIN) 500 mg, Oral, Daily   metoprolol succinate (TOPROL-XL) 12.5 mg, Oral, Daily PRN   midodrine (PROAMATINE) 5 mg, Oral, Daily PRN   naloxone (NARCAN) nasal spray 4 mg/0.1 mL 1 spray, Nasal, As needed   NYSTATIN powder 1 Application, Topical, Daily PRN   pantoprazole (PROTONIX) 40 mg, Oral, Daily   potassium chloride (MICRO-K) 10 MEQ CR capsule 20 mEq, Oral, 2 times daily   promethazine (PHENERGAN) 12.5 mg, Oral, Every 6 hours PRN   rifaximin (XIFAXAN) 550 mg, Oral, 2 times daily   spironolactone (ALDACTONE) 25 mg, Oral, Daily   triamcinolone cream (KENALOG) 0.5 % Apply to affected area of right breast twice daily as directed.   venlafaxine XR (EFFEXOR-XR) 75 mg, Oral, Daily with breakfast   Vitamin D (Ergocalciferol) (DRISDOL) 50,000 Units, Oral, Every 7 days    Diet Orders (From admission, onward)     Start     Ordered   05/16/22 2315  Diet Heart Room service appropriate? Yes; Fluid consistency: Thin  Diet effective now       Question Answer Comment  Room service appropriate? Yes   Fluid consistency: Thin      05/16/22 2314            DVT prophylaxis:    Lab Results  Component Value Date   PLT 115 (L) 05/17/2022      Code Status: Full Code  Family Communication: No family at bedside  Status is: Inpatient Remains inpatient appropriate because: AKI, IV antibiotics  Level of care: Telemetry  Consultants:  None   Objective: Vitals:   05/17/22 0946 05/17/22 1427 05/17/22 2149 05/18/22 0610  BP: (!) 118/58 (!) 127/53 (!) 114/52 133/70  Pulse: 84 87 89 81  Resp: 18 (!) 22 18 18   Temp: 97.9 F (36.6  C) 97.7 F (36.5 C) 98.1 F (36.7 C) 98.1 F (36.7 C)  TempSrc: Oral Oral Oral Oral  SpO2: 98% 100% 97% 100%  Weight:      Height:        Intake/Output Summary (Last 24 hours) at 05/18/2022 1011 Last data filed at 05/18/2022 0500 Gross per 24 hour  Intake 2471.01 ml  Output 500 ml  Net 1971.01 ml   Wt Readings from Last 3 Encounters:  05/16/22 119.7 kg  05/01/22 119.8 kg  05/13/21 (!) 141.6 kg    Examination:  Constitutional: NAD Eyes: no scleral icterus ENMT: Mucous membranes are moist.  Neck: normal, supple Respiratory: clear to auscultation bilaterally, no wheezing, no crackles.  Cardiovascular: Regular rate and rhythm, no murmurs / rubs / gallops.  Abdomen: non distended, no tenderness. Bowel sounds positive.  Musculoskeletal: no clubbing / cyanosis.  Skin: no rashes   Data Reviewed: I have independently reviewed following labs and imaging studies  CBC Recent Labs  Lab 05/16/22 2020 05/17/22 0231  WBC 12.7* 10.1  HGB 8.7* 7.9*  HCT 28.2* 25.9*  PLT 141* 115*  MCV 95.3 98.1  MCH 29.4 29.9  MCHC 30.9 30.5  RDW 22.0* 22.1*  LYMPHSABS 1.9  --   MONOABS 0.8  --   EOSABS 0.7*  --   BASOSABS 0.1  --     Recent Labs  Lab 05/16/22 2020 05/16/22 2305 05/17/22 0231  NA 140  --  135  K 4.8  --  4.9  CL 112*  --  107  CO2 20*  --  20*  GLUCOSE 123*  --  101*  BUN 21  --  20  CREATININE 1.47*  --  1.26*  CALCIUM 8.4*  --  7.9*  AST 19  --  20  ALT 16  --  14  ALKPHOS 84  --  73  BILITOT 2.3*  --  2.6*  ALBUMIN 2.5*  --  2.4*  PROCALCITON  --   --  0.13  LATICACIDVEN 3.8* 3.3* 2.2*  INR  --   --  2.1*  AMMONIA 22  --   --     ------------------------------------------------------------------------------------------------------------------ No results for input(s): "CHOL", "HDL", "LDLCALC", "TRIG", "CHOLHDL", "LDLDIRECT" in the last 72 hours.  No results found for:  "HGBA1C" ------------------------------------------------------------------------------------------------------------------ No results for input(s): "TSH", "T4TOTAL", "T3FREE", "THYROIDAB" in the last 72 hours.  Invalid input(s): "FREET3"  Cardiac Enzymes No results for input(s): "CKMB", "TROPONINI", "MYOGLOBIN" in the last 168 hours.  Invalid input(s): "CK" ------------------------------------------------------------------------------------------------------------------    Component Value Date/Time   BNP 111.5 (H) 05/13/2021 2255    CBG: Recent Labs  Lab 05/16/22 2024  GLUCAP 108*    Recent Results (from the past 240 hour(s))  Urine Culture     Status: Abnormal (Preliminary result)   Collection Time: 05/16/22  7:31 PM   Specimen: Urine, Clean Catch  Result Value Ref Range Status   Specimen Description   Final    URINE, CLEAN CATCH Performed at [facility], [address], [place], [place] [address]    Special Requests   Final    NONE Performed at [facility], [address], [place], [place] [address]    Culture (A)  Final    >=100,000 COLONIES/mL GRAM NEGATIVE RODS SUSCEPTIBILITIES TO FOLLOW Performed at [facility], [address] [place], [place] [address]    Report Status PENDING  Incomplete  Blood culture (routine x 2)     Status: None (Preliminary result)   Collection Time: 05/16/22  8:20 PM   Specimen: BLOOD  Result Value Ref Range Status   Specimen Description   Final    BLOOD SITE NOT SPECIFIED Performed at [facility], [address] [place], [place] [address]    Special Requests   Final    BOTTLES DRAWN AEROBIC AND ANAEROBIC Blood Culture adequate volume Performed at [facility], [address], [place], [place] [address]    Culture   Final    NO GROWTH 2 DAYS Performed at [facility], [address] [place], [place] [address]    Report Status PENDING   Incomplete  Blood culture (routine x 2)     Status: None (Preliminary result)   Collection Time: 05/16/22 10:09 PM   Specimen: BLOOD  Result Value Ref Range Status   Specimen Description   Final    BLOOD BLOOD RIGHT ARM Performed at [facility], [address] [place], [place] [address]    Special Requests   Final    BOTTLES DRAWN AEROBIC AND ANAEROBIC Blood Culture adequate volume Performed at [facility], [address], [place], [place] [address]    Culture   Final    NO GROWTH 2 DAYS Performed at [facility], [address] [place], [place] [address]    Report Status PENDING  Incomplete     Radiology Studies: No results found.   Pamella Pert, MD, PhD [facility]  Between 7 am - 7 pm I am available, please contact me via Amion (for emergencies) or Securechat (non urgent messages)  Between 7 pm - 7 am I am not available, please contact night coverage MD/APP via Amion

## 2022-05-19 DIAGNOSIS — A419 Sepsis, unspecified organism: Secondary | ICD-10-CM | POA: Diagnosis not present

## 2022-05-19 DIAGNOSIS — N390 Urinary tract infection, site not specified: Secondary | ICD-10-CM | POA: Diagnosis not present

## 2022-05-19 LAB — COMPREHENSIVE METABOLIC PANEL
ALT: 13 U/L (ref 0–44)
AST: 15 U/L (ref 15–41)
Albumin: 2.6 g/dL — ABNORMAL LOW (ref 3.5–5.0)
Alkaline Phosphatase: 69 U/L (ref 38–126)
Anion gap: 7 (ref 5–15)
BUN: 25 mg/dL — ABNORMAL HIGH (ref 8–23)
CO2: 20 mmol/L — ABNORMAL LOW (ref 22–32)
Calcium: 8.4 mg/dL — ABNORMAL LOW (ref 8.9–10.3)
Chloride: 113 mmol/L — ABNORMAL HIGH (ref 98–111)
Creatinine, Ser: 0.97 mg/dL (ref 0.44–1.00)
GFR, Estimated: >60 mL/min (ref >60)
Glucose, Bld: 97 mg/dL (ref 70–99)
Potassium: 3.9 mmol/L (ref 3.5–5.1)
Sodium: 140 mmol/L (ref 135–145)
Total Bilirubin: 2.2 mg/dL — ABNORMAL HIGH (ref 0.3–1.2)
Total Protein: 5.4 g/dL — ABNORMAL LOW (ref 6.5–8.1)

## 2022-05-19 LAB — CBC
HCT: 23.6 % — ABNORMAL LOW (ref 36.0–46.0)
Hemoglobin: 7.1 g/dL — ABNORMAL LOW (ref 12.0–15.0)
MCH: 29.7 pg (ref 26.0–34.0)
MCHC: 30.1 g/dL (ref 30.0–36.0)
MCV: 98.7 fL (ref 80.0–100.0)
Platelets: 75 10*3/uL — ABNORMAL LOW (ref 150–400)
RBC: 2.39 MIL/uL — ABNORMAL LOW (ref 3.87–5.11)
RDW: 21.2 % — ABNORMAL HIGH (ref 11.5–15.5)
WBC: 5.8 10*3/uL (ref 4.0–10.5)
nRBC: 0 % (ref 0.0–0.2)

## 2022-05-19 LAB — ACTH STIMULATION, 3 TIME POINTS
Cortisol, 30 Min: 9.1 ug/dL
Cortisol, 60 Min: 10 ug/dL
Cortisol, Base: 3.5 ug/dL

## 2022-05-19 LAB — URINE CULTURE

## 2022-05-19 LAB — CULTURE, BLOOD (ROUTINE X 2): Special Requests: ADEQUATE

## 2022-05-19 LAB — MAGNESIUM: Magnesium: 1.4 mg/dL — ABNORMAL LOW (ref 1.7–2.4)

## 2022-05-19 MED ORDER — FUROSEMIDE 40 MG PO TABS
40.0000 mg | ORAL_TABLET | Freq: Every day | ORAL | Status: DC
  Administered 2022-05-19 – 2022-05-22 (×4): 40 mg via ORAL
  Filled 2022-05-19 (×4): qty 1

## 2022-05-19 MED ORDER — COSYNTROPIN 0.25 MG IJ SOLR
0.2500 mg | Freq: Once | INTRAMUSCULAR | Status: AC
  Administered 2022-05-19: 0.25 mg via INTRAVENOUS
  Filled 2022-05-19: qty 0.25

## 2022-05-19 MED ORDER — SPIRONOLACTONE 12.5 MG HALF TABLET
12.5000 mg | ORAL_TABLET | Freq: Every day | ORAL | Status: DC
  Administered 2022-05-19 – 2022-05-22 (×4): 12.5 mg via ORAL
  Filled 2022-05-19 (×4): qty 1

## 2022-05-19 MED ORDER — NYSTATIN 100000 UNIT/GM EX CREA
TOPICAL_CREAM | Freq: Two times a day (BID) | CUTANEOUS | Status: DC
  Filled 2022-05-19 (×2): qty 30

## 2022-05-19 MED ORDER — MAGNESIUM SULFATE 2 GM/50ML IV SOLN
2.0000 g | Freq: Once | INTRAVENOUS | Status: AC
  Administered 2022-05-19: 2 g via INTRAVENOUS
  Filled 2022-05-19: qty 50

## 2022-05-19 NOTE — Progress Notes (Signed)
Physical Therapy Treatment Patient Details Name: [name] MRN: [id number] DOB: [birth year] Today's Date: 05/19/2022   History of Present Illness 67 y.o. female past medical history significant for liver cirrhosis NASH, morbid obesity Candida infection pulmonary embolism not on anticoagulation, short blood syndrome was discharged from the hospital on 05/10/2022 from septic shock due to mastitis in addition to possible UTI comes in today for altered mental status and dysuria. Dx of sepsis, UTI.    PT Comments    General Comments: AxO x3 very pleasant Lady and supportive Son.  General bed mobility comments: First, perofrmed side to side rolling for peri care after episode loose watery stools.  Pt self able to roll and lift her legs to clean peri area as well as apply oinment.  Buttocks are VERY red/rashy/painful.  Then pt was able to 'pop up' using bed rail and sel sit EOB on her own. General transfer comment: pt was self able to rise from elevated bed and complete 1/4 turn to Bar BSC.  Assisted with peri care as pt was unable to reach (body habitus) but was able to self rise. General Gait Details: pt was able to amb 40 feet x 2 in hallway while Son followed with recliner.  Only c/o was painful buttock rash. Positioned in recliner to comfort. Pt plans to return home with Son. Pt has all needed at equipment at home.   They have a trip planned for [facility] next week.    Recommendations for follow up therapy are one component of a multi-disciplinary discharge planning process, led by the attending physician.  Recommendations may be updated based on patient status, additional functional criteria and insurance authorization.  Follow Up Recommendations       Assistance Recommended at Discharge Intermittent Supervision/Assistance  Patient can return home with the following A little help with walking and/or transfers;A little help with bathing/dressing/bathroom;Assistance with cooking/housework;Assist for  transportation;Help with stairs or ramp for entrance   Equipment Recommendations  None recommended by PT    Recommendations for Other Services       Precautions / Restrictions Precautions Precautions: Fall Precaution Comments: denies falls in past 6 months Restrictions Weight Bearing Restrictions: No     Mobility  Bed Mobility Overal bed mobility: Needs Assistance Bed Mobility: Supine to Sit     Supine to sit: Modified independent (Device/Increase time)     General bed mobility comments: First, perofrmed side to side rolling for peri care after episode loose watery stools.  Pt self able to roll and lift her legs to clean peri area as well as apply oinment.  Buttocks are VERY red/rashy/painful.  Then pt was able to 'pop up' using bed rail and sel sit EOB on her own.    Transfers Overall transfer level: Needs assistance Equipment used: None, Rolling walker (2 wheels) Transfers: Sit to/from Stand Sit to Stand: Supervision   Step pivot transfers: Supervision       General transfer comment: pt was self able to rise from elevated bed and complete 1/4 turn to Bar BSC.  Assisted with peri care as pt was unable to reach (body habitus) but was able to self rise.    Ambulation/Gait Ambulation/Gait assistance: Supervision, Min guard Gait Distance (Feet): 80 Feet (40 feet x 2) Assistive device: Rolling walker (2 wheels) Gait Pattern/deviations: Step-through pattern, Decreased stride length Gait velocity: decr     General Gait Details: pt was able to amb 40 feet x 2 in hallway while Son followed with recliner.  Only c/o  was painful buttock rash.   Stairs             Wheelchair Mobility    Modified Rankin (Stroke Patients Only)       Balance                                            Cognition Arousal/Alertness: Awake/alert Behavior During Therapy: WFL for tasks assessed/performed Overall Cognitive Status: Within Functional Limits for tasks  assessed                                 General Comments: AxO x3 very pleasant Lady and supportive Son        Exercises      General Comments        Pertinent Vitals/Pain Pain Assessment Pain Assessment: Faces Faces Pain Scale: Hurts even more Pain Location: buttocks, red, rash Pain Descriptors / Indicators: Burning, Sore Pain Intervention(s): Monitored during session, Premedicated before session, Repositioned    Home Living                          Prior Function            PT Goals (current goals can now be found in the care plan section) Progress towards PT goals: Progressing toward goals    Frequency    Min 1X/week      PT Plan Current plan remains appropriate    Co-evaluation              AM-PAC PT "6 Clicks" Mobility   Outcome Measure  Help needed turning from your back to your side while in a flat bed without using bedrails?: None Help needed moving from lying on your back to sitting on the side of a flat bed without using bedrails?: None Help needed moving to and from a bed to a chair (including a wheelchair)?: None Help needed standing up from a chair using your arms (e.g., wheelchair or bedside chair)?: None Help needed to walk in hospital room?: A Little Help needed climbing 3-5 steps with a railing? : A Little 6 Click Score: 22    End of Session Equipment Utilized During Treatment: Gait belt Activity Tolerance: Patient tolerated treatment well Patient left: with call bell/phone within reach;with family/visitor present;in chair;with chair alarm set Nurse Communication: Mobility status PT Visit Diagnosis: Difficulty in walking, not elsewhere classified (R26.2)     Time: 1100-1130 PT Time Calculation (min) (ACUTE ONLY): 30 min  Charges:  $Gait Training: 8-22 mins $Therapeutic Activity: 8-22 mins                     {   PTA Acute  [employer] M-F          [phone]

## 2022-05-19 NOTE — Care Management Important Message (Signed)
Important Message  Patient Details IM Letter given. Name: [name] MRN: [id number] Date of Birth: Oct 16, 1955   Medicare Important Message Given:  Yes     [name] 05/19/2022, 10:37 AM

## 2022-05-19 NOTE — Progress Notes (Signed)
PROGRESS NOTE  [name] WUJ:[id number] DOB: 23-Dec-1955 DOA: 05/16/2022 PCP: [name], MD   LOS: 3 days   Brief Narrative / Interim history: 67 year old female with Nash cirrhosis, HTN, prior PE/DVT currently not on anticoagulation comes into the hospital with complaints of confusion, was found to have a UTI. 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  She was recently hospitalized 4/20-4/30 2024 with right breast mastitis/UTI, septic shock, improved.  Subjective / 24h Interval events: Doing well this morning, no complaints other than still feeling a little bit "foggy".  About to eat breakfast.  Assesement and Plan: Principal Problem:   Sepsis secondary to UTI [facility]) Active Problems:   Liver cirrhosis secondary to NASH (HCC)   AKI (acute kidney injury) (HCC)   Obesity, Class III, BMI 40-49.9 (morbid obesity) (HCC)   Iron deficiency anemia   Benign essential hypertension   GERD (gastroesophageal reflux disease)   Thrombocytopenia (HCC)   Principal problem Sepsis due to UTI -she had leukocytosis as well as elevated lactic acid.  She has been placed on antibiotics with vancomycin and cefepime, urine culture preliminarily this morning with gram-negative's, discontinue vancomycin and keep on cefepime alone.  Follow speciation/sensitivities, still pending this morning  Active problems Liver cirrhosis due to NASH, hepatic encephalopathy -patient was confused on admission in the setting of UTI.  Confusion much better this morning.  Home Lasix and spironolactone were on hold, resume this morning.  Continue rifaximin, hold lactulose if she is having plenty of bowel movements  Acute kidney injury -possibly in the setting of using diuretics at home.  She was given fluid, renal function now has normalized.  Resume home diuretics at half the home dose  Anasarca -overall euvolemic.  Status post albumin yesterday.  Continue  midodrine for blood pressure support, back on her home diuretics at half the home dose  Normocytic anemia/thrombocytopenia  -this is likely in the setting of underlying liver disease.  She did require blood transfusion during her prior hospitalization, GI was consulted at that time without recommending interventions but to monitor and continue Protonix.  Possible adrenal insufficiency-ACTH 3 point time pending  Recent mastitis involving right breast -Better  Recent history of eye surgery - Blind in left eye with recent gunderson flap surgery on 4/19 with [facility] ophthalmology.  Continue antibiotic ophthalmic ointment, ketorolac eye drop    Hypothyroidism - On Synthyroid   History of depression - Venlafaxine   Morbid obesity -BMI 48.  She would benefit from weight loss   Scheduled Meds:  atorvastatin  40 mg Oral Daily   enoxaparin (LOVENOX) injection  60 mg Subcutaneous Q24H   fluconazole  100 mg Oral Daily   furosemide  40 mg Oral Daily   levothyroxine  150 mcg Oral Q0600   nystatin cream   Topical BID   pantoprazole  40 mg Oral Daily   rifaximin  550 mg Oral BID   spironolactone  12.5 mg Oral Daily   venlafaxine XR  75 mg Oral Q breakfast   Continuous Infusions:  ceFEPime (MAXIPIME) IV 2 g (05/19/22 0833)   PRN Meds:.hydrOXYzine, metoprolol succinate, midodrine, nortriptyline  Current Outpatient Medications  Medication Instructions   acetaminophen (TYLENOL) 650 mg, Oral, Every 6 hours PRN, Patient used this medication for her headache.    allopurinol (ZYLOPRIM) 100 mg, Oral, Daily   atorvastatin (LIPITOR) 40 mg, Oral, Every morning   colchicine 0.6 mg, Oral, Daily PRN   Constulose  10 g, Oral, 2 times daily   cyanocobalamin (VITAMIN B12) 2,000 mcg, Oral, Daily   EPINEPHrine (EPI-PEN) 0.3 mg, Intramuscular, As needed   furosemide (LASIX) 40 mg, Oral, 2 times daily   HYDROcodone-acetaminophen (NORCO) 10-325 MG tablet 1 tablet, Oral, Every 6 hours PRN   hydrOXYzine  (ATARAX) 25 mg, Oral, 3 times daily PRN   IRON-VITAMIN C PO 1 tablet, Oral, Daily   levothyroxine (SYNTHROID) 150 mcg, Oral, Daily before breakfast   magnesium oxide (MAG-OX) 400 mg, Oral, Daily   methocarbamol (ROBAXIN) 500 mg, Oral, Daily   metoprolol succinate (TOPROL-XL) 12.5 mg, Oral, Daily PRN   midodrine (PROAMATINE) 5 mg, Oral, Daily PRN   naloxone (NARCAN) nasal spray 4 mg/0.1 mL 1 spray, Nasal, As needed   NYSTATIN powder 1 Application, Topical, Daily PRN   pantoprazole (PROTONIX) 40 mg, Oral, Daily   potassium chloride (MICRO-K) 10 MEQ CR capsule 20 mEq, Oral, 2 times daily   promethazine (PHENERGAN) 12.5 mg, Oral, Every 6 hours PRN   rifaximin (XIFAXAN) 550 mg, Oral, 2 times daily   spironolactone (ALDACTONE) 25 mg, Oral, Daily   triamcinolone cream (KENALOG) 0.5 % Apply to affected area of right breast twice daily as directed.   venlafaxine XR (EFFEXOR-XR) 75 mg, Oral, Daily with breakfast   Vitamin D (Ergocalciferol) (DRISDOL) 50,000 Units, Oral, Every 7 days    Diet Orders (From admission, onward)     Start     Ordered   05/16/22 2315  Diet Heart Room service appropriate? Yes; Fluid consistency: Thin  Diet effective now       Question Answer Comment  Room service appropriate? Yes   Fluid consistency: Thin      05/16/22 2314            DVT prophylaxis:    Lab Results  Component Value Date   PLT 75 (L) 05/19/2022      Code Status: Full Code  Family Communication: No family at bedside  Status is: Inpatient Remains inpatient appropriate because: AKI, IV antibiotics  Level of care: Telemetry  Consultants:  None   Objective: Vitals:   05/18/22 0610 05/18/22 1311 05/18/22 1944 05/19/22 0326  BP: 133/70 (!) 120/58 (!) 115/42 118/62  Pulse: 81 83 74 75  Resp: 18 18 18 18   Temp: 98.1 F (36.7 C) 97.8 F (36.6 C) 97.6 F (36.4 C) 97.6 F (36.4 C)  TempSrc: Oral Oral Oral Oral  SpO2: 100% 98% 100% 100%  Weight:      Height:         Intake/Output Summary (Last 24 hours) at 05/19/2022 1116 Last data filed at 05/19/2022 1000 Gross per 24 hour  Intake 1700.21 ml  Output 150 ml  Net 1550.21 ml    Wt Readings from Last 3 Encounters:  05/16/22 119.7 kg  05/01/22 119.8 kg  05/13/21 (!) 141.6 kg    Examination:  Constitutional: NAD Eyes: lids and conjunctivae normal, no scleral icterus ENMT: mmm Neck: normal, supple Respiratory: clear to auscultation bilaterally, no wheezing, no crackles. Normal respiratory effort.  Cardiovascular: Regular rate and rhythm, no murmurs / rubs / gallops. No LE edema. Abdomen: soft, no distention, no tenderness. Bowel sounds positive.    Data Reviewed: I have independently reviewed following labs and imaging studies  CBC Recent Labs  Lab 05/16/22 2020 05/17/22 0231 05/18/22 1004 05/19/22 0542  WBC 12.7* 10.1 7.9 5.8  HGB 8.7* 7.9* 8.3* 7.1*  HCT 28.2* 25.9* 27.4* 23.6*  PLT 141* 115* 101* 75*  MCV 95.3  98.1 97.9 98.7  MCH 29.4 29.9 29.6 29.7  MCHC 30.9 30.5 30.3 30.1  RDW 22.0* 22.1* 21.3* 21.2*  LYMPHSABS 1.9  --   --   --   MONOABS 0.8  --   --   --   EOSABS 0.7*  --   --   --   BASOSABS 0.1  --   --   --      Recent Labs  Lab 05/16/22 2020 05/16/22 2305 05/17/22 0231 05/18/22 1004 05/19/22 0542  NA 140  --  135 139 140  K 4.8  --  4.9 4.7 3.9  CL 112*  --  107 111 113*  CO2 20*  --  20* 20* 20*  GLUCOSE 123*  --  101* 93 97  BUN 21  --  20 25* 25*  CREATININE 1.47*  --  1.26* 1.22* 0.97  CALCIUM 8.4*  --  7.9* 8.3* 8.4*  AST 19  --  20  --  15  ALT 16  --  14  --  13  ALKPHOS 84  --  73  --  69  BILITOT 2.3*  --  2.6*  --  2.2*  ALBUMIN 2.5*  --  2.4*  --  2.6*  MG  --   --   --   --  1.4*  PROCALCITON  --   --  0.13  --   --   LATICACIDVEN 3.8* 3.3* 2.2*  --   --   INR  --   --  2.1*  --   --   AMMONIA 22  --   --   --   --       ------------------------------------------------------------------------------------------------------------------ No results for input(s): "CHOL", "HDL", "LDLCALC", "TRIG", "CHOLHDL", "LDLDIRECT" in the last 72 hours.  No results found for: "HGBA1C" ------------------------------------------------------------------------------------------------------------------ No results for input(s): "TSH", "T4TOTAL", "T3FREE", "THYROIDAB" in the last 72 hours.  Invalid input(s): "FREET3"  Cardiac Enzymes No results for input(s): "CKMB", "TROPONINI", "MYOGLOBIN" in the last 168 hours.  Invalid input(s): "CK" ------------------------------------------------------------------------------------------------------------------    Component Value Date/Time   BNP 111.5 (H) 05/13/2021 2255    CBG: Recent Labs  Lab 05/16/22 2024  GLUCAP 108*     Recent Results (from the past 240 hour(s))  Urine Culture     Status: Abnormal (Preliminary result)   Collection Time: 05/16/22  7:31 PM   Specimen: Urine, Clean Catch  Result Value Ref Range Status   Specimen Description   Final    URINE, CLEAN CATCH Performed at [facility], [address], [place], [place] [address]    Special Requests   Final    NONE Performed at [facility], [address] [place], [place] [address]    Culture (A)  Final    >=100,000 COLONIES/mL ESCHERICHIA COLI REPEATING SUSCEPTIBILITY Performed at [facility], [address] [place], [place] [address]    Report Status PENDING  Incomplete  Blood culture (routine x 2)     Status: None (Preliminary result)   Collection Time: 05/16/22  8:20 PM   Specimen: BLOOD  Result Value Ref Range Status   Specimen Description   Final    BLOOD SITE NOT SPECIFIED Performed at [facility], [address] [place], [place] [address]    Special Requests   Final    BOTTLES DRAWN AEROBIC AND ANAEROBIC Blood Culture  adequate volume Performed at [facility], [address], [place], [place] [address]  Culture   Final    NO GROWTH 3 DAYS Performed at [facility], [address] [place], [place] [address]    Report Status PENDING  Incomplete  Blood culture (routine x 2)     Status: None (Preliminary result)   Collection Time: 05/16/22 10:09 PM   Specimen: BLOOD  Result Value Ref Range Status   Specimen Description   Final    BLOOD BLOOD RIGHT ARM Performed at [facility], [address], [place], [place] [address]    Special Requests   Final    BOTTLES DRAWN AEROBIC AND ANAEROBIC Blood Culture adequate volume Performed at [facility], [address] [place], [place] [address]    Culture   Final    NO GROWTH 3 DAYS Performed at [facility], [address] [place], [place] [address]    Report Status PENDING  Incomplete     Radiology Studies: No results found.   Pamella Pert, MD, PhD [facility]  Between 7 am - 7 pm I am available, please contact me via Amion (for emergencies) or Securechat (non urgent messages)  Between 7 pm - 7 am I am not available, please contact night coverage MD/APP via Amion

## 2022-05-19 NOTE — Plan of Care (Signed)
Patient having loose stools, incontinent. Thick layer of barrier cream applied to excoriated areas of buttocks, sacrum and posterior thighs after cleansing, with reports of pain when wiping off stools.  Repositioning encouraged and well tolerated. denies SOB. VS stable.   Problem: Clinical Measurements: Goal: Signs and symptoms of infection will decrease Outcome: Progressing   Problem: Activity: Goal: Risk for activity intolerance will decrease Outcome: Progressing   Problem: Nutrition: Goal: Adequate nutrition will be maintained Outcome: Progressing   Problem: Elimination: Goal: Will not experience complications related to bowel motility Outcome: Progressing   Problem: Pain Managment: Goal: General experience of comfort will improve Outcome: Progressing   Problem: Safety: Goal: Ability to remain free from injury will improve Outcome: Progressing   Problem: Skin Integrity: Goal: Risk for impaired skin integrity will decrease Outcome: Progressing

## 2022-05-20 DIAGNOSIS — N390 Urinary tract infection, site not specified: Secondary | ICD-10-CM | POA: Diagnosis not present

## 2022-05-20 DIAGNOSIS — A419 Sepsis, unspecified organism: Secondary | ICD-10-CM | POA: Diagnosis not present

## 2022-05-20 LAB — COMPREHENSIVE METABOLIC PANEL
ALT: 13 U/L (ref 0–44)
AST: 17 U/L (ref 15–41)
Albumin: 2.5 g/dL — ABNORMAL LOW (ref 3.5–5.0)
Alkaline Phosphatase: 71 U/L (ref 38–126)
Anion gap: 7 (ref 5–15)
BUN: 22 mg/dL (ref 8–23)
CO2: 21 mmol/L — ABNORMAL LOW (ref 22–32)
Calcium: 8.6 mg/dL — ABNORMAL LOW (ref 8.9–10.3)
Chloride: 113 mmol/L — ABNORMAL HIGH (ref 98–111)
Creatinine, Ser: 0.91 mg/dL (ref 0.44–1.00)
GFR, Estimated: >60 mL/min (ref >60)
Glucose, Bld: 85 mg/dL (ref 70–99)
Potassium: 3.9 mmol/L (ref 3.5–5.1)
Sodium: 141 mmol/L (ref 135–145)
Total Bilirubin: 2 mg/dL — ABNORMAL HIGH (ref 0.3–1.2)
Total Protein: 5.6 g/dL — ABNORMAL LOW (ref 6.5–8.1)

## 2022-05-20 LAB — CBC
HCT: 24.9 % — ABNORMAL LOW (ref 36.0–46.0)
Hemoglobin: 7.4 g/dL — ABNORMAL LOW (ref 12.0–15.0)
MCH: 28.8 pg (ref 26.0–34.0)
MCHC: 29.7 g/dL — ABNORMAL LOW (ref 30.0–36.0)
MCV: 96.9 fL (ref 80.0–100.0)
Platelets: 70 10*3/uL — ABNORMAL LOW (ref 150–400)
RBC: 2.57 MIL/uL — ABNORMAL LOW (ref 3.87–5.11)
RDW: 21.2 % — ABNORMAL HIGH (ref 11.5–15.5)
WBC: 5.3 10*3/uL (ref 4.0–10.5)
nRBC: 0 % (ref 0.0–0.2)

## 2022-05-20 LAB — MAGNESIUM: Magnesium: 1.4 mg/dL — ABNORMAL LOW (ref 1.7–2.4)

## 2022-05-20 LAB — CULTURE, BLOOD (ROUTINE X 2): Special Requests: ADEQUATE

## 2022-05-20 LAB — URINE CULTURE: Culture: >=100000 — AB

## 2022-05-20 LAB — CARBAPENEM RESISTANCE PANEL
Carba Resistance IMP Gene: NOT DETECTED
Carba Resistance KPC Gene: NOT DETECTED
Carba Resistance NDM Gene: NOT DETECTED
Carba Resistance OXA48 Gene: NOT DETECTED
Carba Resistance VIM Gene: NOT DETECTED

## 2022-05-20 MED ORDER — SODIUM CHLORIDE 0.9 % IV SOLN
2.0000 g | Freq: Three times a day (TID) | INTRAVENOUS | Status: DC
  Administered 2022-05-20 – 2022-05-22 (×6): 2 g via INTRAVENOUS
  Filled 2022-05-20 (×6): qty 12.5

## 2022-05-20 MED ORDER — MAGNESIUM SULFATE 4 GM/100ML IV SOLN
4.0000 g | Freq: Once | INTRAVENOUS | Status: AC
  Administered 2022-05-20: 4 g via INTRAVENOUS
  Filled 2022-05-20: qty 100

## 2022-05-20 NOTE — Progress Notes (Signed)
PROGRESS NOTE  [name] WUJ:[id number] DOB: October 31, 1955 DOA: 05/16/2022 PCP: [name], MD   LOS: 4 days   Brief Narrative / Interim history: 67 year old female with Nash cirrhosis, HTN, prior PE/DVT currently not on anticoagulation comes into the hospital with complaints of confusion, was found to have a UTI. 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  She was recently hospitalized 4/20-4/30 2024 with right breast mastitis/UTI, septic shock, improved.  Subjective / 24h Interval events: Doing well today, no chest pain, no abdominal pain, no nausea/vomiting  Assesement and Plan: Principal Problem:   Sepsis secondary to UTI [facility]) Active Problems:   Liver cirrhosis secondary to NASH (HCC)   AKI (acute kidney injury) (HCC)   Obesity, Class III, BMI 40-49.9 (morbid obesity) (HCC)   Iron deficiency anemia   Benign essential hypertension   GERD (gastroesophageal reflux disease)   Thrombocytopenia (HCC)   Principal problem Sepsis due to UTI -she had leukocytosis as well as elevated lactic acid.  She has been placed on antibiotics with vancomycin and cefepime, then narrowed to cefepime.  Cultures speciated highly resistant E. coli, sensitive to cefepime, gentamicin and nitrofurantoin.  Continue cefepime for now, clinically she is improving.  Probably will plan for fosfomycin on the day of discharge  Active problems Liver cirrhosis due to NASH, hepatic encephalopathy -patient was confused on admission in the setting of UTI.  Confusion resolved.  Home Lasix and spironolactone were on hold, now they were resumed.  Continue rifaximin, hold lactulose if she is having plenty of bowel movements  Acute kidney injury -possibly in the setting of using diuretics at home.  She was given fluid, renal function now has normalized.  Home diuretics were resumed, kidney function is stable  Anasarca -overall euvolemic.  Status post albumin  yesterday.  Continue midodrine for blood pressure support, back on her home diuretics at half the home dose  Normocytic anemia/thrombocytopenia  -this is likely in the setting of underlying liver disease.  She did require blood transfusion during her prior hospitalization, GI was consulted at that time without recommending interventions but to monitor and continue Protonix.  Hemoglobin is overall stable  Possible adrenal insufficiency-3 times point cosyntropin stim testing showed relatively poor response, however this is very difficult to interpret due to decreased cortisol binding globulin in the setting of hypoalbuminemia due to liver cirrhosis.  She has no symptoms of adrenal insufficiency, not hypotensive nor orthostatic, could potentially be further worked up as an outpatient once she improves with a free cortisol level if concerns persist  Recent mastitis involving right breast -Better  Recent history of eye surgery - Blind in left eye with recent gunderson flap surgery on 4/19 with [facility] ophthalmology.  Continue antibiotic ophthalmic ointment, ketorolac eye drop    Hypothyroidism - On Synthyroid   History of depression - Venlafaxine   Morbid obesity -BMI 48.  She would benefit from weight loss   Scheduled Meds:  atorvastatin  40 mg Oral Daily   enoxaparin (LOVENOX) injection  60 mg Subcutaneous Q24H   fluconazole  100 mg Oral Daily   furosemide  40 mg Oral Daily   levothyroxine  150 mcg Oral Q0600   nystatin cream   Topical BID   pantoprazole  40 mg Oral Daily   rifaximin  550 mg Oral BID   spironolactone  12.5 mg Oral Daily   venlafaxine XR  75 mg Oral Q breakfast   Continuous Infusions:  ceFEPime (MAXIPIME) IV     PRN Meds:.hydrOXYzine, metoprolol succinate, midodrine, nortriptyline  Current Outpatient Medications  Medication Instructions   acetaminophen (TYLENOL) 650 mg, Oral, Every 6 hours PRN, Patient used this medication for her headache.    allopurinol  (ZYLOPRIM) 100 mg, Oral, Daily   atorvastatin (LIPITOR) 40 mg, Oral, Every morning   colchicine 0.6 mg, Oral, Daily PRN   Constulose 10 g, Oral, 2 times daily   cyanocobalamin (VITAMIN B12) 2,000 mcg, Oral, Daily   EPINEPHrine (EPI-PEN) 0.3 mg, Intramuscular, As needed   furosemide (LASIX) 40 mg, Oral, 2 times daily   HYDROcodone-acetaminophen (NORCO) 10-325 MG tablet 1 tablet, Oral, Every 6 hours PRN   hydrOXYzine (ATARAX) 25 mg, Oral, 3 times daily PRN   IRON-VITAMIN C PO 1 tablet, Oral, Daily   levothyroxine (SYNTHROID) 150 mcg, Oral, Daily before breakfast   magnesium oxide (MAG-OX) 400 mg, Oral, Daily   methocarbamol (ROBAXIN) 500 mg, Oral, Daily   metoprolol succinate (TOPROL-XL) 12.5 mg, Oral, Daily PRN   midodrine (PROAMATINE) 5 mg, Oral, Daily PRN   naloxone (NARCAN) nasal spray 4 mg/0.1 mL 1 spray, Nasal, As needed   NYSTATIN powder 1 Application, Topical, Daily PRN   pantoprazole (PROTONIX) 40 mg, Oral, Daily   potassium chloride (MICRO-K) 10 MEQ CR capsule 20 mEq, Oral, 2 times daily   promethazine (PHENERGAN) 12.5 mg, Oral, Every 6 hours PRN   rifaximin (XIFAXAN) 550 mg, Oral, 2 times daily   spironolactone (ALDACTONE) 25 mg, Oral, Daily   triamcinolone cream (KENALOG) 0.5 % Apply to affected area of right breast twice daily as directed.   venlafaxine XR (EFFEXOR-XR) 75 mg, Oral, Daily with breakfast   Vitamin D (Ergocalciferol) (DRISDOL) 50,000 Units, Oral, Every 7 days    Diet Orders (From admission, onward)     Start     Ordered   05/16/22 2315  Diet Heart Room service appropriate? Yes; Fluid consistency: Thin  Diet effective now       Question Answer Comment  Room service appropriate? Yes   Fluid consistency: Thin      05/16/22 2314            DVT prophylaxis:    Lab Results  Component Value Date   PLT 70 (L) 05/20/2022      Code Status: Full Code  Family Communication: No family at bedside  Status is: Inpatient Remains inpatient appropriate  because: AKI, IV antibiotics  Level of care: Telemetry  Consultants:  None   Objective: Vitals:   05/19/22 1327 05/19/22 2004 05/20/22 0556 05/20/22 1303  BP: (!) 115/57 125/65 (!) 123/59 124/74  Pulse: 80 72 74 82  Resp: 20 18 18 18   Temp: (!) 97.5 F (36.4 C) (!) 97.5 F (36.4 C) 97.7 F (36.5 C) (!) 97.3 F (36.3 C)  TempSrc: Oral Oral Oral Oral  SpO2: 100% 100% 97% 98%  Weight:      Height:        Intake/Output Summary (Last 24 hours) at 05/20/2022 1348 Last data filed at 05/20/2022 0930 Gross per 24 hour  Intake 340 ml  Output 0 ml  Net 340 ml    Wt Readings from Last 3 Encounters:  05/16/22 119.7 kg  05/01/22 119.8 kg  05/13/21 (!) 141.6 kg    Examination:  Constitutional: NAD Eyes: lids and conjunctivae normal, no scleral icterus ENMT: mmm Neck: normal, supple Respiratory: clear to auscultation bilaterally, no wheezing, no crackles. Normal respiratory effort.  Cardiovascular: Regular rate and rhythm, no murmurs /  rubs / gallops. No LE edema. Abdomen: soft, no distention, no tenderness. Bowel sounds positive.    Data Reviewed: I have independently reviewed following labs and imaging studies  CBC Recent Labs  Lab 05/16/22 2020 05/17/22 0231 05/18/22 1004 05/19/22 0542 05/20/22 0832  WBC 12.7* 10.1 7.9 5.8 5.3  HGB 8.7* 7.9* 8.3* 7.1* 7.4*  HCT 28.2* 25.9* 27.4* 23.6* 24.9*  PLT 141* 115* 101* 75* 70*  MCV 95.3 98.1 97.9 98.7 96.9  MCH 29.4 29.9 29.6 29.7 28.8  MCHC 30.9 30.5 30.3 30.1 29.7*  RDW 22.0* 22.1* 21.3* 21.2* 21.2*  LYMPHSABS 1.9  --   --   --   --   MONOABS 0.8  --   --   --   --   EOSABS 0.7*  --   --   --   --   BASOSABS 0.1  --   --   --   --      Recent Labs  Lab 05/16/22 2020 05/16/22 2305 05/17/22 0231 05/18/22 1004 05/19/22 0542 05/20/22 0832  NA 140  --  135 139 140 141  K 4.8  --  4.9 4.7 3.9 3.9  CL 112*  --  107 111 113* 113*  CO2 20*  --  20* 20* 20* 21*  GLUCOSE 123*  --  101* 93 97 85  BUN 21  --  20  25* 25* 22  CREATININE 1.47*  --  1.26* 1.22* 0.97 0.91  CALCIUM 8.4*  --  7.9* 8.3* 8.4* 8.6*  AST 19  --  20  --  15 17  ALT 16  --  14  --  13 13  ALKPHOS 84  --  73  --  69 71  BILITOT 2.3*  --  2.6*  --  2.2* 2.0*  ALBUMIN 2.5*  --  2.4*  --  2.6* 2.5*  MG  --   --   --   --  1.4* 1.4*  PROCALCITON  --   --  0.13  --   --   --   LATICACIDVEN 3.8* 3.3* 2.2*  --   --   --   INR  --   --  2.1*  --   --   --   AMMONIA 22  --   --   --   --   --      ------------------------------------------------------------------------------------------------------------------ No results for input(s): "CHOL", "HDL", "LDLCALC", "TRIG", "CHOLHDL", "LDLDIRECT" in the last 72 hours.  No results found for: "HGBA1C" ------------------------------------------------------------------------------------------------------------------ No results for input(s): "TSH", "T4TOTAL", "T3FREE", "THYROIDAB" in the last 72 hours.  Invalid input(s): "FREET3"  Cardiac Enzymes No results for input(s): "CKMB", "TROPONINI", "MYOGLOBIN" in the last 168 hours.  Invalid input(s): "CK" ------------------------------------------------------------------------------------------------------------------    Component Value Date/Time   BNP 111.5 (H) 05/13/2021 2255    CBG: Recent Labs  Lab 05/16/22 2024  GLUCAP 108*     Recent Results (from the past 240 hour(s))  Urine Culture     Status: Abnormal   Collection Time: 05/16/22  7:31 PM   Specimen: Urine, Clean Catch  Result Value Ref Range Status   Specimen Description   Final    URINE, CLEAN CATCH Performed at [facility], [address] [place], [place] [address]    Special Requests   Final    NONE Performed at [facility], [address] [place], [place] [address]    Culture >=100,000 COLONIES/mL ESCHERICHIA COLI (A)  Final  Report Status 05/20/2022 FINAL  Final   Organism ID, Bacteria ESCHERICHIA COLI (A)  Final       Susceptibility   Escherichia coli - MIC*    AMPICILLIN >=32 RESISTANT Resistant     CEFAZOLIN >=64 RESISTANT Resistant     CEFEPIME 0.5 SENSITIVE Sensitive     CEFTRIAXONE >=64 RESISTANT Resistant     CIPROFLOXACIN >=4 RESISTANT Resistant     GENTAMICIN 2 SENSITIVE Sensitive     IMIPENEM RESISTANT Resistant     NITROFURANTOIN <=16 SENSITIVE Sensitive     TRIMETH/SULFA >=320 RESISTANT Resistant     AMPICILLIN/SULBACTAM >=32 RESISTANT Resistant     PIP/TAZO >=128 RESISTANT Resistant     * >=100,000 COLONIES/mL ESCHERICHIA COLI  Carbapenem Resistance Panel     Status: None   Collection Time: 05/16/22  7:31 PM  Result Value Ref Range Status   Carba Resistance IMP Gene NOT DETECTED NOT DETECTED Final   Carba Resistance VIM Gene NOT DETECTED NOT DETECTED Final   Carba Resistance NDM Gene NOT DETECTED NOT DETECTED Final   Carba Resistance KPC Gene NOT DETECTED NOT DETECTED Final   Carba Resistance OXA48 Gene NOT DETECTED NOT DETECTED Final    Comment: (NOTE) Cepheid Carba-R is an FDA-cleared nucleic acid amplification test  (NAAT)for the detection and differentiation of genes encoding the  most prevalent carbapenemases in bacterial isolate samples. Carbapenemase gene identification and implementation of comprehensive  infection control measures are recommended by the CDC to prevent the  spread of the resistant organisms. Performed at [facility], [address], [place], [place] [address]   Blood culture (routine x 2)     Status: None (Preliminary result)   Collection Time: 05/16/22  8:20 PM   Specimen: BLOOD  Result Value Ref Range Status   Specimen Description   Final    BLOOD SITE NOT SPECIFIED Performed at [facility], [address] [place], [place] [address]    Special Requests   Final    BOTTLES DRAWN AEROBIC AND ANAEROBIC Blood Culture adequate volume Performed at [facility], [address], [place], [place] [address]     Culture   Final    NO GROWTH 4 DAYS Performed at [facility], [address] [place], [place] [address]    Report Status PENDING  Incomplete  Blood culture (routine x 2)     Status: None (Preliminary result)   Collection Time: 05/16/22 10:09 PM   Specimen: BLOOD  Result Value Ref Range Status   Specimen Description   Final    BLOOD BLOOD RIGHT ARM Performed at [facility], [address] [place], [place] [address]    Special Requests   Final    BOTTLES DRAWN AEROBIC AND ANAEROBIC Blood Culture adequate volume Performed at [facility], [address], [place], [place] [address]    Culture   Final    NO GROWTH 4 DAYS Performed at [facility], [address] [place], [place] [address]    Report Status PENDING  Incomplete     Radiology Studies: No results found.   Pamella Pert, MD, PhD [facility]  Between 7 am - 7 pm I am available, please contact me via Amion (for emergencies) or Securechat (non urgent messages)  Between 7 pm - 7 am I am not available, please contact night coverage MD/APP via Amion

## 2022-05-20 NOTE — Progress Notes (Signed)
Mobility Specialist - Progress Note   05/20/22 1351  Mobility  Activity Ambulated with assistance in hallway  Level of Assistance Standby assist, set-up cues, supervision of patient - no hands on  Assistive Device Front wheel walker  Distance Ambulated (ft) 200 ft  Range of Motion/Exercises Active  Activity Response Tolerated well  Mobility Referral Yes  $Mobility charge 1 Mobility  Mobility Specialist Start Time (ACUTE ONLY) 1338  Mobility Specialist Stop Time (ACUTE ONLY) 1351  Mobility Specialist Time Calculation (min) (ACUTE ONLY) 13 min   Pt received in bed and agreed to mobility, pt needed a BM prior to ambulating in hallway, pt had no issues throughout session. Returned to bed with all needs met.  Marilynne Halsted Mobility Specialist '

## 2022-05-20 NOTE — Progress Notes (Signed)
Pharmacy Antibiotic Note  [name] is a 67 y.o. female admitted on 05/16/2022 with sepsis.  Pharmacy has been consulted for Cefepime + Vancomycin dosing.  She was recently admitted 4/20>>4/30 with septic shock due to mastitis cellulitis and Klebsiella UTI.  She completed course of Cefepime>>cefadroxil during hospitalization.  Additionally she had Vanc IV>.Zyvox PO which she was discharged on and should have completed course 5/5.   Scr 0.91, Crcl now 77 ml/min   Plan: Adjust cefepime to Cefepime 2gm IV q8h    Height: 5\' 2"  (157.5 cm) Weight: 119.7 kg (264 lb) IBW/kg (Calculated) : 50.1  Temp (24hrs), Avg:97.5 F (36.4 C), Min:97.3 F (36.3 C), Max:97.7 F (36.5 C)  Recent Labs  Lab 05/16/22 2020 05/16/22 2305 05/17/22 0231 05/18/22 1004 05/19/22 0542 05/20/22 0832  WBC 12.7*  --  10.1 7.9 5.8 5.3  CREATININE 1.47*  --  1.26* 1.22* 0.97 0.91  LATICACIDVEN 3.8* 3.3* 2.2*  --   --   --      Estimated Creatinine Clearance: 76.9 mL/min (by C-G formula based on SCr of 0.91 mg/dL).    Allergies  Allergen Reactions   Ivp Dye [Iodinated Contrast Media] Anaphylaxis   Ace Inhibitors Swelling   Chlorhexidine     Pt has a rash, & made irritation skin further   Doxycycline Rash   Penicillins Rash   Zofran [Ondansetron] Rash    Antimicrobials this admission: 5/6 Cefepime >>  5/6 Vancomycin >> 5/8 5/8 fluconazole >>    Dose adjustments this admission:   Microbiology results: 5/6 BCx: ngtd 5/6 UCx:  >100k ESBL E.coli Previous 4/20 UCx: kleb pneumo 4/20 BCx: flavobacterium odoratum - MDR  Thank you for allowing pharmacy to be a part of this patient's care.   Adalberto Cole, PharmD, BCPS 05/20/2022 1:38 PM

## 2022-05-21 DIAGNOSIS — A419 Sepsis, unspecified organism: Secondary | ICD-10-CM | POA: Diagnosis not present

## 2022-05-21 DIAGNOSIS — N390 Urinary tract infection, site not specified: Secondary | ICD-10-CM | POA: Diagnosis not present

## 2022-05-21 LAB — BASIC METABOLIC PANEL
Anion gap: 8 (ref 5–15)
BUN: 22 mg/dL (ref 8–23)
CO2: 17 mmol/L — ABNORMAL LOW (ref 22–32)
Calcium: 8.3 mg/dL — ABNORMAL LOW (ref 8.9–10.3)
Chloride: 113 mmol/L — ABNORMAL HIGH (ref 98–111)
Creatinine, Ser: 0.8 mg/dL (ref 0.44–1.00)
GFR, Estimated: >60 mL/min (ref >60)
Glucose, Bld: 108 mg/dL — ABNORMAL HIGH (ref 70–99)
Potassium: 3.9 mmol/L (ref 3.5–5.1)
Sodium: 138 mmol/L (ref 135–145)

## 2022-05-21 LAB — MAGNESIUM: Magnesium: 1.9 mg/dL (ref 1.7–2.4)

## 2022-05-21 LAB — CULTURE, BLOOD (ROUTINE X 2): Culture: NO GROWTH

## 2022-05-21 LAB — TYPE AND SCREEN
ABO/RH(D): AB NEG
Antibody Screen: NEGATIVE
Unit division: 0

## 2022-05-21 LAB — PREPARE RBC (CROSSMATCH)

## 2022-05-21 LAB — HEMOGLOBIN AND HEMATOCRIT, BLOOD
HCT: 24.8 % — ABNORMAL LOW (ref 36.0–46.0)
Hemoglobin: 7 g/dL — ABNORMAL LOW (ref 12.0–15.0)

## 2022-05-21 LAB — BPAM RBC
ISSUE DATE / TIME: 202405111217
Unit Type and Rh: 600

## 2022-05-21 MED ORDER — SODIUM CHLORIDE 0.9% IV SOLUTION: Freq: Once | INTRAVENOUS | Status: AC

## 2022-05-21 NOTE — Progress Notes (Signed)
PROGRESS NOTE  [name] OZH:[id number] DOB: 12-Aug-1955 DOA: 05/16/2022 PCP: [name], MD   LOS: 5 days   Brief Narrative / Interim history: 67 year old female with Nash cirrhosis, HTN, prior PE/DVT currently not on anticoagulation comes into the hospital with complaints of confusion, was found to have a UTI. 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  She was recently hospitalized 4/20-4/30 2024 with right breast mastitis/UTI, septic shock, improved.  Subjective / 24h Interval events: Doing well this morning, awaiting breakfast.  She denies any chest pain, denies any shortness of breath.  No abdominal pain, no nausea or vomiting.  She slept well overnight.  Assesement and Plan: Principal Problem:   Sepsis secondary to UTI [facility]) Active Problems:   Liver cirrhosis secondary to NASH (HCC)   AKI (acute kidney injury) (HCC)   Obesity, Class III, BMI 40-49.9 (morbid obesity) (HCC)   Iron deficiency anemia   Benign essential hypertension   GERD (gastroesophageal reflux disease)   Thrombocytopenia (HCC)   Principal problem Sepsis due to UTI -she had leukocytosis as well as elevated lactic acid.  She has been placed on antibiotics with vancomycin and cefepime, then narrowed to cefepime.  Cultures speciated highly resistant E. coli, sensitive to cefepime, gentamicin and nitrofurantoin.  Continue cefepime for now, clinically she is improving.  Probably will plan for fosfomycin on the day of discharge, likely tomorrow  Active problems Liver cirrhosis due to NASH, hepatic encephalopathy -patient was confused on admission in the setting of UTI.  Confusion resolved.  Home Lasix and spironolactone were on hold, now they were resumed.  Continue rifaximin, hold lactulose if she is having plenty of bowel movements  Acute kidney injury -possibly in the setting of using diuretics at home.  She was given fluid, renal function now has  normalized.  Home diuretics were resumed, kidney function remaining stable today  Anasarca -overall euvolemic.  Status post albumin yesterday.  Continue midodrine for blood pressure support, back on her home diuretics at half the home dose  Normocytic anemia/thrombocytopenia  -this is likely in the setting of underlying liver disease.  She did require blood transfusion during her prior hospitalization, GI was consulted at that time without recommending interventions but to monitor and continue Protonix.  Hemoglobin is overall stable however trending down to 7.0 this morning.  She does not have any evidence of bleed.  Transfusing a unit of packed red blood cells  Possible adrenal insufficiency-3 times point cosyntropin stim testing showed relatively poor response, however this is very difficult to interpret due to decreased cortisol binding globulin in the setting of hypoalbuminemia due to liver cirrhosis.  She has no symptoms of adrenal insufficiency, not hypotensive nor orthostatic, could potentially be further worked up as an outpatient once she improves with a free cortisol level if concerns persist  Recent mastitis involving right breast -Better  Recent history of eye surgery - Blind in left eye with recent gunderson flap surgery on 4/19 with [facility] ophthalmology.  Continue antibiotic ophthalmic ointment, ketorolac eye drop    Hypothyroidism - On Synthyroid   History of depression - Venlafaxine   Morbid obesity -BMI 48.  She would benefit from weight loss   Scheduled Meds:  atorvastatin  40 mg Oral Daily   enoxaparin (LOVENOX) injection  60 mg Subcutaneous Q24H   fluconazole  100 mg Oral Daily   furosemide  40 mg Oral Daily   levothyroxine  150 mcg Oral  [id number]   nystatin cream   Topical BID   pantoprazole  40 mg Oral Daily   rifaximin  550 mg Oral BID   spironolactone  12.5 mg Oral Daily   venlafaxine XR  75 mg Oral Q breakfast   Continuous Infusions:  ceFEPime (MAXIPIME) IV 2  g (05/21/22 0815)   PRN Meds:.hydrOXYzine, metoprolol succinate, midodrine, nortriptyline  Current Outpatient Medications  Medication Instructions   acetaminophen (TYLENOL) 650 mg, Oral, Every 6 hours PRN, Patient used this medication for her headache.    allopurinol (ZYLOPRIM) 100 mg, Oral, Daily   atorvastatin (LIPITOR) 40 mg, Oral, Every morning   colchicine 0.6 mg, Oral, Daily PRN   Constulose 10 g, Oral, 2 times daily   cyanocobalamin (VITAMIN B12) 2,000 mcg, Oral, Daily   EPINEPHrine (EPI-PEN) 0.3 mg, Intramuscular, As needed   furosemide (LASIX) 40 mg, Oral, 2 times daily   HYDROcodone-acetaminophen (NORCO) 10-325 MG tablet 1 tablet, Oral, Every 6 hours PRN   hydrOXYzine (ATARAX) 25 mg, Oral, 3 times daily PRN   IRON-VITAMIN C PO 1 tablet, Oral, Daily   levothyroxine (SYNTHROID) 150 mcg, Oral, Daily before breakfast   magnesium oxide (MAG-OX) 400 mg, Oral, Daily   methocarbamol (ROBAXIN) 500 mg, Oral, Daily   metoprolol succinate (TOPROL-XL) 12.5 mg, Oral, Daily PRN   midodrine (PROAMATINE) 5 mg, Oral, Daily PRN   naloxone (NARCAN) nasal spray 4 mg/0.1 mL 1 spray, Nasal, As needed   NYSTATIN powder 1 Application, Topical, Daily PRN   pantoprazole (PROTONIX) 40 mg, Oral, Daily   potassium chloride (MICRO-K) 10 MEQ CR capsule 20 mEq, Oral, 2 times daily   promethazine (PHENERGAN) 12.5 mg, Oral, Every 6 hours PRN   rifaximin (XIFAXAN) 550 mg, Oral, 2 times daily   spironolactone (ALDACTONE) 25 mg, Oral, Daily   triamcinolone cream (KENALOG) 0.5 % Apply to affected area of right breast twice daily as directed.   venlafaxine XR (EFFEXOR-XR) 75 mg, Oral, Daily with breakfast   Vitamin D (Ergocalciferol) (DRISDOL) 50,000 Units, Oral, Every 7 days    Diet Orders (From admission, onward)     Start     Ordered   05/16/22 2315  Diet Heart Room service appropriate? Yes; Fluid consistency: Thin  Diet effective now       Question Answer Comment  Room service appropriate? Yes   Fluid  consistency: Thin      05/16/22 2314            DVT prophylaxis:    Lab Results  Component Value Date   PLT 70 (L) 05/20/2022      Code Status: Full Code  Family Communication: No family at bedside  Status is: Inpatient Remains inpatient appropriate because: Blood transfusion  Level of care: Telemetry  Consultants:  None   Objective: Vitals:   05/20/22 0556 05/20/22 1303 05/20/22 1935 05/21/22 0603  BP: (!) 123/59 124/74 122/62 120/60  Pulse: 74 82 70 80  Resp: 18 18 18 16   Temp: 97.7 F (36.5 C) (!) 97.3 F (36.3 C) (!) 97.4 F (36.3 C) (!) 97.3 F (36.3 C)  TempSrc: Oral Oral Oral Oral  SpO2: 97% 98% 98% (!) 78%  Weight:      Height:        Intake/Output Summary (Last 24 hours) at 05/21/2022 1033 Last data filed at 05/21/2022 0419 Gross per 24 hour  Intake 200 ml  Output --  Net 200 ml    Wt Readings from Last 3 Encounters:  05/16/22 119.7 kg  05/01/22  119.8 kg  05/13/21 (!) 141.6 kg    Examination:  Constitutional: NAD Eyes: lids and conjunctivae normal, no scleral icterus ENMT: mmm Neck: normal, supple Respiratory: clear to auscultation bilaterally, no wheezing, no crackles. Normal respiratory effort.  Cardiovascular: Regular rate and rhythm, no murmurs / rubs / gallops. No LE edema. Abdomen: soft, no distention, no tenderness. Bowel sounds positive.    Data Reviewed: I have independently reviewed following labs and imaging studies  CBC Recent Labs  Lab 05/16/22 2020 05/17/22 0231 05/18/22 1004 05/19/22 0542 05/20/22 0832 05/21/22 0528  WBC 12.7* 10.1 7.9 5.8 5.3  --   HGB 8.7* 7.9* 8.3* 7.1* 7.4* 7.0*  HCT 28.2* 25.9* 27.4* 23.6* 24.9* 24.8*  PLT 141* 115* 101* 75* 70*  --   MCV 95.3 98.1 97.9 98.7 96.9  --   MCH 29.4 29.9 29.6 29.7 28.8  --   MCHC 30.9 30.5 30.3 30.1 29.7*  --   RDW 22.0* 22.1* 21.3* 21.2* 21.2*  --   LYMPHSABS 1.9  --   --   --   --   --   MONOABS 0.8  --   --   --   --   --   EOSABS 0.7*  --   --   --    --   --   BASOSABS 0.1  --   --   --   --   --      Recent Labs  Lab 05/16/22 2020 05/16/22 2305 05/17/22 0231 05/18/22 1004 05/19/22 0542 05/20/22 0832 05/21/22 0528  NA 140  --  135 139 140 141 138  K 4.8  --  4.9 4.7 3.9 3.9 3.9  CL 112*  --  107 111 113* 113* 113*  CO2 20*  --  20* 20* 20* 21* 17*  GLUCOSE 123*  --  101* 93 97 85 108*  BUN 21  --  20 25* 25* 22 22  CREATININE 1.47*  --  1.26* 1.22* 0.97 0.91 0.80  CALCIUM 8.4*  --  7.9* 8.3* 8.4* 8.6* 8.3*  AST 19  --  20  --  15 17  --   ALT 16  --  14  --  13 13  --   ALKPHOS 84  --  73  --  69 71  --   BILITOT 2.3*  --  2.6*  --  2.2* 2.0*  --   ALBUMIN 2.5*  --  2.4*  --  2.6* 2.5*  --   MG  --   --   --   --  1.4* 1.4* 1.9  PROCALCITON  --   --  0.13  --   --   --   --   LATICACIDVEN 3.8* 3.3* 2.2*  --   --   --   --   INR  --   --  2.1*  --   --   --   --   AMMONIA 22  --   --   --   --   --   --      ------------------------------------------------------------------------------------------------------------------ No results for input(s): "CHOL", "HDL", "LDLCALC", "TRIG", "CHOLHDL", "LDLDIRECT" in the last 72 hours.  No results found for: "HGBA1C" ------------------------------------------------------------------------------------------------------------------ No results for input(s): "TSH", "T4TOTAL", "T3FREE", "THYROIDAB" in the last 72 hours.  Invalid input(s): "FREET3"  Cardiac Enzymes No results for input(s): "CKMB", "TROPONINI", "MYOGLOBIN" in the last 168 hours.  Invalid input(s): "CK" ------------------------------------------------------------------------------------------------------------------    Component Value Date/Time   BNP 111.5 (  H) 05/13/2021 2255    CBG: Recent Labs  Lab 05/16/22 2024  GLUCAP 108*     Recent Results (from the past 240 hour(s))  Urine Culture     Status: Abnormal   Collection Time: 05/16/22  7:31 PM   Specimen: Urine, Clean Catch  Result Value Ref Range  Status   Specimen Description   Final    URINE, CLEAN CATCH Performed at [facility], [address] [place], [place] [address]    Special Requests   Final    NONE Performed at [facility], [address] [place], [place] [address]    Culture >=100,000 COLONIES/mL ESCHERICHIA COLI (A)  Final   Report Status 05/20/2022 FINAL  Final   Organism ID, Bacteria ESCHERICHIA COLI (A)  Final      Susceptibility   Escherichia coli - MIC*    AMPICILLIN >=32 RESISTANT Resistant     CEFAZOLIN >=64 RESISTANT Resistant     CEFEPIME 0.5 SENSITIVE Sensitive     CEFTRIAXONE >=64 RESISTANT Resistant     CIPROFLOXACIN >=4 RESISTANT Resistant     GENTAMICIN 2 SENSITIVE Sensitive     IMIPENEM RESISTANT Resistant     NITROFURANTOIN <=16 SENSITIVE Sensitive     TRIMETH/SULFA >=320 RESISTANT Resistant     AMPICILLIN/SULBACTAM >=32 RESISTANT Resistant     PIP/TAZO >=128 RESISTANT Resistant     * >=100,000 COLONIES/mL ESCHERICHIA COLI  Carbapenem Resistance Panel     Status: None   Collection Time: 05/16/22  7:31 PM  Result Value Ref Range Status   Carba Resistance IMP Gene NOT DETECTED NOT DETECTED Final   Carba Resistance VIM Gene NOT DETECTED NOT DETECTED Final   Carba Resistance NDM Gene NOT DETECTED NOT DETECTED Final   Carba Resistance KPC Gene NOT DETECTED NOT DETECTED Final   Carba Resistance OXA48 Gene NOT DETECTED NOT DETECTED Final    Comment: (NOTE) Cepheid Carba-R is an FDA-cleared nucleic acid amplification test  (NAAT)for the detection and differentiation of genes encoding the  most prevalent carbapenemases in bacterial isolate samples. Carbapenemase gene identification and implementation of comprehensive  infection control measures are recommended by the CDC to prevent the  spread of the resistant organisms. Performed at [facility], [address] [place], [place] [address]   Blood culture (routine x 2)     Status: None (Preliminary  result)   Collection Time: 05/16/22  8:20 PM   Specimen: BLOOD  Result Value Ref Range Status   Specimen Description   Final    BLOOD SITE NOT SPECIFIED Performed at [facility], [address], [place], [place] [address]    Special Requests   Final    BOTTLES DRAWN AEROBIC AND ANAEROBIC Blood Culture adequate volume Performed at [facility], [address] [place], [place] [address]    Culture   Final    NO GROWTH 4 DAYS Performed at [facility], [address], [place], [place] [address]    Report Status PENDING  Incomplete  Blood culture (routine x 2)     Status: None (Preliminary result)   Collection Time: 05/16/22 10:09 PM   Specimen: BLOOD  Result Value Ref Range Status   Specimen Description   Final    BLOOD BLOOD RIGHT ARM Performed at [facility], [address] [place], [place] [address]    Special Requests   Final    BOTTLES DRAWN AEROBIC AND ANAEROBIC Blood Culture adequate volume  Performed at [facility], [address], [place], [place] [address]    Culture   Final    NO GROWTH 4 DAYS Performed at [facility], [address] [place], [place] [address]    Report Status PENDING  Incomplete     Radiology Studies: No results found.   Pamella Pert, MD, PhD [facility]  Between 7 am - 7 pm I am available, please contact me via Amion (for emergencies) or Securechat (non urgent messages)  Between 7 pm - 7 am I am not available, please contact night coverage MD/APP via Amion

## 2022-05-22 DIAGNOSIS — N390 Urinary tract infection, site not specified: Secondary | ICD-10-CM | POA: Diagnosis not present

## 2022-05-22 DIAGNOSIS — A419 Sepsis, unspecified organism: Secondary | ICD-10-CM | POA: Diagnosis not present

## 2022-05-22 LAB — CBC
HCT: 25.8 % — ABNORMAL LOW (ref 36.0–46.0)
Hemoglobin: 8 g/dL — ABNORMAL LOW (ref 12.0–15.0)
MCH: 29.6 pg (ref 26.0–34.0)
MCHC: 31 g/dL (ref 30.0–36.0)
MCV: 95.6 fL (ref 80.0–100.0)
Platelets: 43 10*3/uL — ABNORMAL LOW (ref 150–400)
RBC: 2.7 MIL/uL — ABNORMAL LOW (ref 3.87–5.11)
RDW: 19.7 % — ABNORMAL HIGH (ref 11.5–15.5)
WBC: 5.1 10*3/uL (ref 4.0–10.5)
nRBC: 0 % (ref 0.0–0.2)

## 2022-05-22 LAB — BASIC METABOLIC PANEL
Anion gap: 7 (ref 5–15)
BUN: 22 mg/dL (ref 8–23)
CO2: 19 mmol/L — ABNORMAL LOW (ref 22–32)
Calcium: 8.4 mg/dL — ABNORMAL LOW (ref 8.9–10.3)
Chloride: 111 mmol/L (ref 98–111)
Creatinine, Ser: 0.81 mg/dL (ref 0.44–1.00)
GFR, Estimated: >60 mL/min (ref >60)
Glucose, Bld: 92 mg/dL (ref 70–99)
Potassium: 3.8 mmol/L (ref 3.5–5.1)
Sodium: 137 mmol/L (ref 135–145)

## 2022-05-22 LAB — TYPE AND SCREEN

## 2022-05-22 LAB — BPAM RBC: Blood Product Expiration Date: 202406102359

## 2022-05-22 MED ORDER — SPIRONOLACTONE 25 MG PO TABS: 12.5000 mg | ORAL_TABLET | Freq: Every day | ORAL | 0 refills | Status: DC

## 2022-05-22 MED ORDER — NYSTATIN 100000 UNIT/GM EX CREA: TOPICAL_CREAM | Freq: Two times a day (BID) | CUTANEOUS | 0 refills | Status: DC

## 2022-05-22 MED ORDER — FLUCONAZOLE 100 MG PO TABS: 100.0000 mg | ORAL_TABLET | Freq: Every day | ORAL | 0 refills | Status: AC

## 2022-05-22 MED ORDER — FOSFOMYCIN TROMETHAMINE 3 G PO PACK
3.0000 g | PACK | Freq: Once | ORAL | Status: AC
  Administered 2022-05-22: 3 g via ORAL
  Filled 2022-05-22: qty 3

## 2022-05-22 MED ORDER — FUROSEMIDE 40 MG PO TABS: 40.0000 mg | ORAL_TABLET | Freq: Every day | ORAL | 0 refills | Status: DC

## 2022-05-22 NOTE — Discharge Summary (Signed)
Physician Discharge Summary  [name] ZOX:[id number] DOB: [birth year] DOA: 05/16/2022  PCP: [name], MD  Admit date: 05/16/2022 Discharge date: 05/22/2022  Admitted From: home Disposition:  home  Recommendations for Outpatient Follow-up:  Follow up with PCP in 1-2 weeks Please obtain BMP/CBC in one week  Home Health: PT Equipment/Devices: walker  Discharge Condition: stable CODE STATUS: Full code Diet Orders (From admission, onward)     Start     Ordered   05/16/22 2315  Diet Heart Room service appropriate? Yes; Fluid consistency: Thin  Diet effective now       Question Answer Comment  Room service appropriate? Yes   Fluid consistency: Thin      05/16/22 2314            HPI: Per admitting MD, [name] is a 67 y.o. female with medical history significant of liver cirrhosis due to NASH, recent AKI due to hypotension, morbid obesity, candidal skin infections, pulmonary embolism but currently not on anticoagulation, chronic diarrhea, essential hypertension and short gut syndrome who was just discharged from the hospital on April 30 after admission with septic shock.  Patient was in the hospital for total of 10 days.  During that hospitalization she was seen by infectious disease as well.  She did have hepatic encephalopathy at the time.  Also anemia with positive fecal blood.  Was seen by GI.  She did have mastitis of the right breast which in addition to the UTI was suspected to be the cause.  Her urine culture at the time also grew for some form of bacteria.  Thought to be also bacteremic from that.  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  Today however she will return with dysuria altered mental status again.  Patient is also meeting sepsis criteria.  She is being readmitted with acute metabolic encephalopathy probably secondary to recurrent UTI.  Her ammonia level is normal.   Hospital Course / Discharge diagnoses: Principal Problem:   Sepsis  secondary to UTI [facility]) Active Problems:   Liver cirrhosis secondary to NASH (HCC)   AKI (acute kidney injury) (HCC)   Obesity, Class III, BMI 40-49.9 (morbid obesity) (HCC)   Iron deficiency anemia   Benign essential hypertension   GERD (gastroesophageal reflux disease)   Thrombocytopenia (HCC)   Principal problem Sepsis due to UTI -she had leukocytosis as well as elevated lactic acid.  She has been placed on antibiotics with vancomycin and cefepime, then narrowed to cefepime.  Cultures speciated highly resistant E. coli, sensitive to cefepime, gentamicin and nitrofurantoin, but otherwise resistant.  She received cefepime for 7 days, clinically improved, mental status back to baseline, will be given a dose of fosfomycin and discharged home in stable condition.   Active problems Liver cirrhosis due to NASH, hepatic encephalopathy -patient was confused on admission in the setting of UTI.  Confusion resolved.  Home Lasix and spironolactone were on hold, now they were resumed at lower the home dose.  Continue home regimen with lactulose and rifaximin upon discharge  Acute kidney injury -possibly in the setting of using diuretics at home.  She was given fluid, renal function now has normalized.  Home diuretics were resumed but at half of the home dose, kidney function has remained stable, continue upon discharge  Anasarca -overall euvolemic.  Received albumin, continue midodrine for blood pressure support, diuretics  Normocytic anemia/thrombocytopenia  -this is likely in the setting of underlying liver disease.  She did require blood transfusion  during her prior hospitalization, and also 1 unit while here, GI was consulted at that time without recommending interventions but to monitor and continue Protonix.  Possible adrenal insufficiency-3 times point cosyntropin stim testing showed relatively poor response, however this is very difficult to interpret due to decreased cortisol binding globulin in the  setting of hypoalbuminemia due to liver cirrhosis.  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  Continue antibiotic ophthalmic ointment, ketorolac eye drop Hypothyroidism - On Synthyroid History of depression - Venlafaxine Morbid obesity -BMI 48.  She would benefit from weight loss Perineal fungal infection -will do Diflucan for 5 more days for a 10-day total course   Discharge Instructions   Allergies as of 05/22/2022       Reactions   Ivp Dye [iodinated Contrast Media] Anaphylaxis   Ace Inhibitors Swelling   Chlorhexidine    Pt has a rash, & made irritation skin further   Doxycycline Rash   Penicillins Rash   Zofran [ondansetron] Rash        Medication List     STOP taking these medications    linezolid 600 MG tablet Commonly known as: ZYVOX   nystatin powder Generic drug: nystatin Replaced by: nystatin cream   potassium chloride 10 MEQ CR capsule Commonly known as: MICRO-K       TAKE these medications    acetaminophen 325 MG tablet Commonly known as: TYLENOL Take 650 mg by mouth every 6 (six) hours as needed for moderate pain or headache. Patient used this medication for her headache.   allopurinol 100 MG tablet Commonly known as: ZYLOPRIM Take 100 mg by mouth daily.   atorvastatin 40 MG tablet Commonly known as: LIPITOR Take 40 mg by mouth in the morning.   colchicine 0.6 MG tablet Take 0.6 mg by mouth daily as needed (gout).   Constulose 10 GM/15ML solution Generic drug: lactulose Take 15 mLs (10 g total) by mouth 2 (two) times daily.   cyanocobalamin 1000 MCG tablet Commonly known as: VITAMIN B12 Take 2,000 mcg by mouth  daily.   EPINEPHrine 0.3 mg/0.3 mL Soaj injection Commonly known as: EPI-PEN Inject 0.3 mg into the muscle as needed for anaphylaxis.   fluconazole 100 MG tablet Commonly known as: DIFLUCAN Take 1 tablet (100 mg total) by mouth daily for 5 days. Start taking on: May 23, 2022   furosemide 40 MG tablet Commonly known as: LASIX Take 1 tablet (40 mg total) by mouth daily. What changed: when to take this   HYDROcodone-acetaminophen 10-325 MG tablet Commonly known as: NORCO Take 1 tablet by mouth every 6 (six) hours as needed for moderate pain or severe pain.   hydrOXYzine 25 MG tablet Commonly known as: ATARAX Take 25 mg by mouth 3 (three) times daily as needed for itching.   IRON-VITAMIN C PO Take 1 tablet by mouth daily.   levothyroxine 150 MCG tablet Commonly known as: SYNTHROID Take 150 mcg by mouth daily before breakfast.   magnesium oxide 400 MG tablet Commonly known as: MAG-OX Take 400 mg by mouth daily.   methocarbamol 500 MG tablet Commonly known as: ROBAXIN Take 500 mg by mouth daily.   metoprolol succinate 25 MG 24 hr tablet Commonly  known as: TOPROL-XL Take 12.5 mg by mouth daily as needed (afib).   midodrine 5 MG tablet Commonly known as: PROAMATINE Take 5 mg by mouth daily as needed (low blood pressure).   naloxone 4 MG/0.1ML Liqd nasal spray kit Commonly known as: NARCAN Place 1 spray into the nose as needed (accidental overdose).   nystatin cream Commonly known as: MYCOSTATIN Apply topically 2 (two) times daily. Replaces: nystatin powder   pantoprazole 40 MG tablet Commonly known as: PROTONIX Take 1 tablet (40 mg total) by mouth daily.   promethazine 12.5 MG tablet Commonly known as: PHENERGAN Take 12.5 mg by mouth every 6 (six) hours as needed for nausea or vomiting.   rifaximin 550 MG Tabs tablet Commonly known as: XIFAXAN Take 550 mg by mouth 2 (two) times daily.   spironolactone 25 MG tablet Commonly known as: ALDACTONE Take 0.5  tablets (12.5 mg total) by mouth daily. What changed: how much to take   triamcinolone cream 0.5 % Commonly known as: KENALOG Apply to affected area of right breast twice daily as directed.   venlafaxine XR 75 MG 24 hr capsule Commonly known as: EFFEXOR-XR Take 75 mg by mouth daily with breakfast.   Vitamin D (Ergocalciferol) 1.25 MG (50000 UNIT) Caps capsule Commonly known as: DRISDOL Take 50,000 Units by mouth every 7 (seven) days.        Consultations: none  Procedures/Studies:  DG Chest Portable 1 View  Result Date: 05/16/2022 CLINICAL DATA:  Shortness of breath and altered mental status. EXAM: PORTABLE CHEST 1 VIEW COMPARISON:  April 30, 2022 FINDINGS: The cardiac silhouette is mildly enlarged and unchanged in size. There is marked severity calcification of the aortic arch. Mild, diffuse, chronic appearing increased lung markings are seen with mild left basilar atelectasis and/or infiltrate. There is no evidence of a pleural effusion or pneumothorax. The visualized skeletal structures are unremarkable. IMPRESSION: Chronic appearing increased lung markings with mild left basilar atelectasis and/or infiltrate. Electronically Signed   By: Aram Candela M.D.   On: 05/16/2022 20:41   CT HEAD WO CONTRAST  Result Date: 05/16/2022 CLINICAL DATA:  Mental status change, unknown cause EXAM: CT HEAD WITHOUT CONTRAST TECHNIQUE: Contiguous axial images were obtained from the base of the skull through the vertex without intravenous contrast. RADIATION DOSE REDUCTION: This exam was performed according to the departmental dose-optimization program which includes automated exposure control, adjustment of the mA and/or kV according to patient size and/or use of iterative reconstruction technique. COMPARISON:  Head CT 01/19/2019, report from head CT 10/07/2021, images unavailable FINDINGS: Brain: No intracranial hemorrhage, mass effect, or midline shift. No hydrocephalus. The basilar cisterns are  patent. Normal for age atrophy with similar chronic small vessel ischemia. No evidence of territorial infarct or acute ischemia. No extra-axial or intracranial fluid collection. Vascular: Atherosclerosis of skullbase vasculature without hyperdense vessel or abnormal calcification. Skull: No fracture or focal lesion. Sinuses/Orbits: Paranasal sinuses and mastoid air cells are clear. The visualized orbits are unremarkable. Other: None. IMPRESSION: 1. No acute intracranial abnormality. 2. Normal for age atrophy with chronic small vessel ischemia. Electronically Signed   By: Narda Rutherford M.D.   On: 05/16/2022 20:03   MS US BREAST LTD UNI RIGHT INC AXILLA  Result Date: 05/09/2022 CLINICAL DATA:  Clinical concern for right breast abscess. EXAM: ULTRASOUND OF THE RIGHT BREAST COMPARISON:  Right breast ultrasound on 12/21/2021. Most recent prior mammography dated 08/24/2020. FINDINGS: Targeted ultrasound is performed, showing hyperechoic fat and intervening edema, but no defined collection to suggest  an abscess. No mass. IMPRESSION: 1. Findings consistent with mastitis, breast edema/inflammation. 2. No evidence of an abscess. 3. No sonographic evidence of malignancy. RECOMMENDATION: 1. Antibiotic therapy recommended for the presumed right breast mastitis. 2. Follow-up at the breast Center for right breast diagnostic mammography and possible repeat ultrasound in 10-14 days to assess for improvement/resolution. Earlier follow-up recommended if symptoms worsen. I have discussed the findings and recommendations with the patient. If applicable, a reminder letter will be sent to the patient regarding the next appointment. BI-RADS CATEGORY  2: Benign. Electronically Signed   By: Amie Portland M.D.   On: 05/09/2022 12:39   CT CHEST ABDOMEN PELVIS WO CONTRAST  Result Date: 04/30/2022 CLINICAL DATA:  Sepsis.  Cough. EXAM: CT CHEST, ABDOMEN AND PELVIS WITHOUT CONTRAST TECHNIQUE: Multidetector CT imaging of the chest,  abdomen and pelvis was performed following the standard protocol without IV contrast. RADIATION DOSE REDUCTION: This exam was performed according to the departmental dose-optimization program which includes automated exposure control, adjustment of the mA and/or kV according to patient size and/or use of iterative reconstruction technique. COMPARISON:  Chest CT dated 02/07/2022. FINDINGS: Evaluation of this exam is limited in the absence of intravenous contrast. CT CHEST FINDINGS Cardiovascular: There is no cardiomegaly or pericardial effusion. Advanced 3 vessel coronary vascular calcification. Mild atherosclerotic calcification of the thoracic aorta. No aneurysmal dilatation. The central pulmonary arteries are grossly unremarkable on this noncontrast CT. Mediastinum/Nodes: No hilar or mediastinal adenopathy. The esophagus is grossly unremarkable. There is a 1.8 cm rim calcified right thyroid nodule. No mediastinal fluid collection. Lungs/Pleura: Trace bilateral pleural effusions. There is minimal associated compressive atelectasis of the left lower lobe. Pneumonia is not excluded. There is no consolidative changes or pneumothorax. The central airways are patent. Musculoskeletal: Osteopenia with degenerative changes of spine. No acute osseous pathology. CT ABDOMEN PELVIS FINDINGS No intra-abdominal free air.  Small ascites. Hepatobiliary: Irregular liver contour consistent with cirrhosis. No biliary dilatation. Cholecystectomy. Pancreas: The pancreas is unremarkable. Spleen: Normal in size without focal abnormality. Adrenals/Urinary Tract: The adrenal glands are unremarkable. Vascular calcification versus a 3 mm nonobstructing right renal upper pole calculus. No hydronephrosis. The left kidney is unremarkable. The urinary bladder is grossly unremarkable. Stomach/Bowel: There is postsurgical changes of the bowel with ileocolic anastomosis in the right hemiabdomen. No bowel obstruction. Vascular/Lymphatic: Moderate  aortoiliac atherosclerotic disease. The IVC is unremarkable. No portal venous gas. No adenopathy. A tangle of dilated vessels in the abdomen consistent with varices and indicative of portal hypertension. Reproductive: The uterus is anteverted. Multiple calcified fibroids measure up to 6 cm. Other: There is diffuse subcutaneous edema. There is a 2 cm peritoneal defect in the left anterior pelvic wall with herniation of small amount of ascitic fluid. Musculoskeletal: Osteopenia with degenerative changes of the spine. No acute osseous pathology. IMPRESSION: 1. Trace bilateral pleural effusions with minimal associated compressive atelectasis of the left lower lobe. Pneumonia is not excluded. 2. Cirrhosis with evidence of portal hypertension and small ascites. 3. Postsurgical changes of the bowel with ileocolic anastomosis in the right hemiabdomen. No bowel obstruction. 4. Diffuse subcutaneous edema and anasarca. 5.  Aortic Atherosclerosis (ICD10-I70.0). Electronically Signed   By: Elgie Collard M.D.   On: 04/30/2022 21:52   DG Chest Port 1 View  Result Date: 04/30/2022 CLINICAL DATA:  Sepsis EXAM: PORTABLE CHEST 1 VIEW COMPARISON:  X-ray 05/13/2021.  CT scan 02/07/2022 FINDINGS: Underinflation with enlarged cardiopericardial silhouette. Left midlung scar or atelectasis. No consolidation, pneumothorax. Decreasing left effusion. Films are under penetrated.  Calcified aorta. Overlapping cardiac leads IMPRESSION: Underinflation. Left midlung scar or atelectasis. Improving left pleural effusion Electronically Signed   By: Karen Kays M.D.   On: 04/30/2022 20:39     Subjective: - no chest pain, shortness of breath, no abdominal pain, nausea or vomiting.   Discharge Exam: BP 139/69 (BP Location: Right Wrist)   Pulse 88   Temp 97.7 F (36.5 C) (Oral)   Resp 16   Ht 5\' 2"  (1.575 m)   Wt 119.7 kg   SpO2 98%   BMI 48.29 kg/m   General: Pt is alert, awake, not in acute distress Cardiovascular: RRR, S1/S2  +, no rubs, no gallops Respiratory: CTA bilaterally, no wheezing, no rhonchi Abdominal: Soft, NT, ND, bowel sounds + Extremities: no edema, no cyanosis   The results of significant diagnostics from this hospitalization (including imaging, microbiology, ancillary and laboratory) are listed below for reference.     Microbiology: Recent Results (from the past 240 hour(s))  Urine Culture     Status: Abnormal   Collection Time: 05/16/22  7:31 PM   Specimen: Urine, Clean Catch  Result Value Ref Range Status   Specimen Description   Final    URINE, CLEAN CATCH Performed at [facility], [address] [place], [place] [address]    Special Requests   Final    NONE Performed at [facility], [address] [place], [place] [address]    Culture >=100,000 COLONIES/mL ESCHERICHIA COLI (A)  Final   Report Status 05/20/2022 FINAL  Final   Organism ID, Bacteria ESCHERICHIA COLI (A)  Final      Susceptibility   Escherichia coli - MIC*    AMPICILLIN >=32 RESISTANT Resistant     CEFAZOLIN >=64 RESISTANT Resistant     CEFEPIME 0.5 SENSITIVE Sensitive     CEFTRIAXONE >=64 RESISTANT Resistant     CIPROFLOXACIN >=4 RESISTANT Resistant     GENTAMICIN 2 SENSITIVE Sensitive     IMIPENEM RESISTANT Resistant     NITROFURANTOIN <=16 SENSITIVE Sensitive     TRIMETH/SULFA >=320 RESISTANT Resistant     AMPICILLIN/SULBACTAM >=32 RESISTANT Resistant     PIP/TAZO >=128 RESISTANT Resistant     * >=100,000 COLONIES/mL ESCHERICHIA COLI  Carbapenem Resistance Panel     Status: None   Collection Time: 05/16/22  7:31 PM  Result Value Ref Range Status   Carba Resistance IMP Gene NOT DETECTED NOT DETECTED Final   Carba Resistance VIM Gene NOT DETECTED NOT DETECTED Final   Carba Resistance NDM Gene NOT DETECTED NOT DETECTED Final   Carba Resistance KPC Gene NOT DETECTED NOT DETECTED Final   Carba Resistance OXA48 Gene NOT DETECTED NOT DETECTED Final    Comment:  (NOTE) Cepheid Carba-R is an FDA-cleared nucleic acid amplification test  (NAAT)for the detection and differentiation of genes encoding the  most prevalent carbapenemases in bacterial isolate samples. Carbapenemase gene identification and implementation of comprehensive  infection control measures are recommended by the CDC to prevent the  spread of the resistant organisms. Performed at [facility], [address], [place], [place] [address]   Blood culture (routine x 2)     Status: None   Collection Time: 05/16/22  8:20 PM   Specimen: BLOOD  Result Value Ref Range Status   Specimen Description   Final    BLOOD SITE NOT SPECIFIED Performed at [facility], [address] [place], [place] [address]    Special Requests   Final  BOTTLES DRAWN AEROBIC AND ANAEROBIC Blood Culture adequate volume Performed at [facility], [address], [place], [place] [address]    Culture   Final    NO GROWTH 5 DAYS Performed at [facility], [address] [place], [place] [address]    Report Status 05/21/2022 FINAL  Final  Blood culture (routine x 2)     Status: None   Collection Time: 05/16/22 10:09 PM   Specimen: BLOOD  Result Value Ref Range Status   Specimen Description   Final    BLOOD BLOOD RIGHT ARM Performed at [facility], [address], [place], [place] [address]    Special Requests   Final    BOTTLES DRAWN AEROBIC AND ANAEROBIC Blood Culture adequate volume Performed at [facility], [address], [place], [place] [address]    Culture   Final    NO GROWTH 5 DAYS Performed at [facility], [address], [place], [place] [address]    Report Status 05/21/2022 FINAL  Final     Labs: Basic Metabolic Panel: Recent Labs  Lab 05/18/22 1004 05/19/22 0542 05/20/22 0832 05/21/22 0528 05/22/22 0540  NA 139 140 141 138 137  K 4.7 3.9 3.9 3.9 3.8  CL 111 113* 113* 113* 111   CO2 20* 20* 21* 17* 19*  GLUCOSE 93 97 85 108* 92  BUN 25* 25* 22 22 22   CREATININE 1.22* 0.97 0.91 0.80 0.81  CALCIUM 8.3* 8.4* 8.6* 8.3* 8.4*  MG  --  1.4* 1.4* 1.9  --    Liver Function Tests: Recent Labs  Lab 05/16/22 2020 05/17/22 0231 05/19/22 0542 05/20/22 0832  AST 19 20 15 17   ALT 16 14 13 13   ALKPHOS 84 73 69 71  BILITOT 2.3* 2.6* 2.2* 2.0*  PROT 6.2* 5.5* 5.4* 5.6*  ALBUMIN 2.5* 2.4* 2.6* 2.5*   CBC: Recent Labs  Lab 05/16/22 2020 05/17/22 0231 05/18/22 1004 05/19/22 0542 05/20/22 0832 05/21/22 0528 05/22/22 0540  WBC 12.7* 10.1 7.9 5.8 5.3  --  5.1  NEUTROABS 9.1*  --   --   --   --   --   --   HGB 8.7* 7.9* 8.3* 7.1* 7.4* 7.0* 8.0*  HCT 28.2* 25.9* 27.4* 23.6* 24.9* 24.8* 25.8*  MCV 95.3 98.1 97.9 98.7 96.9  --  95.6  PLT 141* 115* 101* 75* 70*  --  43*   CBG: Recent Labs  Lab 05/16/22 2024  GLUCAP 108*   Hgb A1c No results for input(s): "HGBA1C" in the last 72 hours. Lipid Profile No results for input(s): "CHOL", "HDL", "LDLCALC", "TRIG", "CHOLHDL", "LDLDIRECT" in the last 72 hours. Thyroid function studies No results for input(s): "TSH", "T4TOTAL", "T3FREE", "THYROIDAB" in the last 72 hours.  Invalid input(s): "FREET3" Urinalysis    Component Value Date/Time   COLORURINE YELLOW 05/16/2022 1931   APPEARANCEUR CLOUDY (A) 05/16/2022 1931   LABSPEC 1.013 05/16/2022 1931   PHURINE 5.0 05/16/2022 1931   GLUCOSEU NEGATIVE 05/16/2022 1931   HGBUR LARGE (A) 05/16/2022 1931   BILIRUBINUR NEGATIVE 05/16/2022 1931   KETONESUR NEGATIVE 05/16/2022 1931   PROTEINUR 30 (A) 05/16/2022 1931   UROBILINOGEN 0.2 08/21/2013 0017   NITRITE NEGATIVE 05/16/2022 1931   LEUKOCYTESUR LARGE (A) 05/16/2022 1931    FURTHER DISCHARGE INSTRUCTIONS:   Get Medicines reviewed and adjusted: Please take all your medications with you for your next visit with your Primary MD   Laboratory/radiological data: Please request your  Primary MD to go over all hospital  tests and procedure/radiological results at the follow up, please ask your Primary MD to get all Hospital records sent to his/her office.   In some cases, they will be blood work, cultures and biopsy results pending at the time of your discharge. Please request that your primary care M.D. goes through all the records of your hospital data and follows up on these results.   Also Note the following: If you experience worsening of your admission symptoms, develop shortness of breath, life threatening emergency, suicidal or homicidal thoughts you must seek medical attention immediately by calling 911 or calling your MD immediately  if symptoms less severe.   You must read complete instructions/literature along with all the possible adverse reactions/side effects for all the Medicines you take and that have been prescribed to you. Take any new Medicines after you have completely understood and accpet all the possible adverse reactions/side effects.    Do not drive when taking Pain medications or sleeping medications (Benzodaizepines)   Do not take more than prescribed Pain, Sleep and Anxiety Medications. It is not advisable to combine anxiety,sleep and pain medications without talking with your primary care practitioner   Special Instructions: If you have smoked or chewed Tobacco  in the last 2 yrs please stop smoking, stop any regular Alcohol  and or any Recreational drug use.   Wear Seat belts while driving.   Please note: You were cared for by a hospitalist during your hospital stay. Once you are discharged, your primary care physician will handle any further medical issues. Please note that NO REFILLS for any discharge medications will be authorized once you are discharged, as it is imperative that you return to your primary care physician (or establish a relationship with a primary care physician if you do not have one) for your post hospital discharge needs so that they can reassess your need for  medications and monitor your lab values.  Time coordinating discharge: 40 minutes  SIGNED:  Pamella Pert, MD, PhD 05/22/2022, 1:45 PM

## 2022-05-22 NOTE — Plan of Care (Signed)

## 2022-05-24 LAB — MISC LABCORP TEST (SEND OUT): Labcorp test code: 88021

## 2022-06-08 DIAGNOSIS — A419 Sepsis, unspecified organism: Secondary | ICD-10-CM | POA: Diagnosis not present

## 2022-06-08 DIAGNOSIS — E785 Hyperlipidemia, unspecified: Secondary | ICD-10-CM | POA: Diagnosis not present

## 2022-06-08 DIAGNOSIS — L608 Other nail disorders: Secondary | ICD-10-CM | POA: Diagnosis not present

## 2022-06-08 DIAGNOSIS — E039 Hypothyroidism, unspecified: Secondary | ICD-10-CM | POA: Diagnosis not present

## 2022-06-08 DIAGNOSIS — H16072 Perforated corneal ulcer, left eye: Secondary | ICD-10-CM | POA: Diagnosis not present

## 2022-06-08 DIAGNOSIS — Z1231 Encounter for screening mammogram for malignant neoplasm of breast: Secondary | ICD-10-CM | POA: Diagnosis not present

## 2022-06-08 DIAGNOSIS — H9193 Unspecified hearing loss, bilateral: Secondary | ICD-10-CM | POA: Diagnosis not present

## 2022-06-08 DIAGNOSIS — K7460 Unspecified cirrhosis of liver: Secondary | ICD-10-CM | POA: Diagnosis not present

## 2022-06-08 DIAGNOSIS — Z79899 Other long term (current) drug therapy: Secondary | ICD-10-CM | POA: Diagnosis not present

## 2022-06-08 DIAGNOSIS — D509 Iron deficiency anemia, unspecified: Secondary | ICD-10-CM | POA: Diagnosis not present

## 2022-06-08 DIAGNOSIS — I10 Essential (primary) hypertension: Secondary | ICD-10-CM | POA: Diagnosis not present

## 2022-06-12 ENCOUNTER — Encounter (HOSPITAL_COMMUNITY): Payer: Self-pay

## 2022-06-12 ENCOUNTER — Observation Stay (HOSPITAL_COMMUNITY): Payer: Medicare (Managed Care)

## 2022-06-12 ENCOUNTER — Other Ambulatory Visit: Payer: Self-pay

## 2022-06-12 ENCOUNTER — Inpatient Hospital Stay (HOSPITAL_COMMUNITY)
Admission: EM | Admit: 2022-06-12 | Discharge: 2022-06-15 | DRG: 689 | Disposition: A | Discharge status: Home Health | Payer: Medicare (Managed Care) | Attending: Internal Medicine | Admitting: Internal Medicine

## 2022-06-12 ENCOUNTER — Emergency Department (HOSPITAL_COMMUNITY): Payer: Medicare (Managed Care)

## 2022-06-12 DIAGNOSIS — G9340 Encephalopathy, unspecified: Secondary | ICD-10-CM | POA: Diagnosis not present

## 2022-06-12 DIAGNOSIS — Z86711 Personal history of pulmonary embolism: Secondary | ICD-10-CM

## 2022-06-12 DIAGNOSIS — Z1611 Resistance to penicillins: Secondary | ICD-10-CM | POA: Diagnosis present

## 2022-06-12 DIAGNOSIS — H5462 Unqualified visual loss, left eye, normal vision right eye: Secondary | ICD-10-CM | POA: Diagnosis present

## 2022-06-12 DIAGNOSIS — N390 Urinary tract infection, site not specified: Secondary | ICD-10-CM | POA: Diagnosis not present

## 2022-06-12 DIAGNOSIS — R011 Cardiac murmur, unspecified: Secondary | ICD-10-CM | POA: Insufficient documentation

## 2022-06-12 DIAGNOSIS — K7460 Unspecified cirrhosis of liver: Secondary | ICD-10-CM | POA: Diagnosis present

## 2022-06-12 DIAGNOSIS — G9341 Metabolic encephalopathy: Secondary | ICD-10-CM | POA: Diagnosis present

## 2022-06-12 DIAGNOSIS — K90829 Short bowel syndrome, unspecified: Secondary | ICD-10-CM | POA: Diagnosis present

## 2022-06-12 DIAGNOSIS — E860 Dehydration: Secondary | ICD-10-CM | POA: Diagnosis present

## 2022-06-12 DIAGNOSIS — Z91041 Radiographic dye allergy status: Secondary | ICD-10-CM

## 2022-06-12 DIAGNOSIS — N179 Acute kidney failure, unspecified: Secondary | ICD-10-CM | POA: Diagnosis present

## 2022-06-12 DIAGNOSIS — B961 Klebsiella pneumoniae [K. pneumoniae] as the cause of diseases classified elsewhere: Secondary | ICD-10-CM | POA: Diagnosis present

## 2022-06-12 DIAGNOSIS — Z881 Allergy status to other antibiotic agents status: Secondary | ICD-10-CM

## 2022-06-12 DIAGNOSIS — Z9889 Other specified postprocedural states: Secondary | ICD-10-CM

## 2022-06-12 DIAGNOSIS — K219 Gastro-esophageal reflux disease without esophagitis: Secondary | ICD-10-CM | POA: Diagnosis present

## 2022-06-12 DIAGNOSIS — Z6841 Body Mass Index (BMI) 40.0 and over, adult: Secondary | ICD-10-CM

## 2022-06-12 DIAGNOSIS — R4182 Altered mental status, unspecified: Principal | ICD-10-CM

## 2022-06-12 DIAGNOSIS — B372 Candidiasis of skin and nail: Secondary | ICD-10-CM | POA: Diagnosis present

## 2022-06-12 DIAGNOSIS — D509 Iron deficiency anemia, unspecified: Secondary | ICD-10-CM | POA: Diagnosis present

## 2022-06-12 DIAGNOSIS — E538 Deficiency of other specified B group vitamins: Secondary | ICD-10-CM | POA: Diagnosis present

## 2022-06-12 DIAGNOSIS — R791 Abnormal coagulation profile: Secondary | ICD-10-CM | POA: Diagnosis present

## 2022-06-12 DIAGNOSIS — Z9049 Acquired absence of other specified parts of digestive tract: Secondary | ICD-10-CM

## 2022-06-12 DIAGNOSIS — I10 Essential (primary) hypertension: Secondary | ICD-10-CM | POA: Diagnosis present

## 2022-06-12 DIAGNOSIS — I080 Rheumatic disorders of both mitral and aortic valves: Secondary | ICD-10-CM | POA: Diagnosis present

## 2022-06-12 DIAGNOSIS — Z880 Allergy status to penicillin: Secondary | ICD-10-CM

## 2022-06-12 DIAGNOSIS — Z888 Allergy status to other drugs, medicaments and biological substances status: Secondary | ICD-10-CM

## 2022-06-12 DIAGNOSIS — B9620 Unspecified Escherichia coli [E. coli] as the cause of diseases classified elsewhere: Secondary | ICD-10-CM | POA: Diagnosis present

## 2022-06-12 DIAGNOSIS — K7581 Nonalcoholic steatohepatitis (NASH): Secondary | ICD-10-CM | POA: Diagnosis present

## 2022-06-12 DIAGNOSIS — Z79890 Hormone replacement therapy (postmenopausal): Secondary | ICD-10-CM

## 2022-06-12 DIAGNOSIS — E039 Hypothyroidism, unspecified: Secondary | ICD-10-CM | POA: Diagnosis present

## 2022-06-12 DIAGNOSIS — K7682 Hepatic encephalopathy: Secondary | ICD-10-CM | POA: Diagnosis present

## 2022-06-12 DIAGNOSIS — I959 Hypotension, unspecified: Secondary | ICD-10-CM | POA: Diagnosis present

## 2022-06-12 DIAGNOSIS — D696 Thrombocytopenia, unspecified: Secondary | ICD-10-CM | POA: Diagnosis present

## 2022-06-12 DIAGNOSIS — Z79899 Other long term (current) drug therapy: Secondary | ICD-10-CM

## 2022-06-12 DIAGNOSIS — Z87891 Personal history of nicotine dependence: Secondary | ICD-10-CM

## 2022-06-12 LAB — CBC WITH DIFFERENTIAL/PLATELET
Abs Immature Granulocytes: 0.05 10*3/uL (ref 0.00–0.07)
Basophils Absolute: 0.1 10*3/uL (ref 0.0–0.1)
Basophils Relative: 1 %
Eosinophils Absolute: 0.2 10*3/uL (ref 0.0–0.5)
Eosinophils Relative: 1 %
HCT: 30.7 % — ABNORMAL LOW (ref 36.0–46.0)
Hemoglobin: 9.7 g/dL — ABNORMAL LOW (ref 12.0–15.0)
Immature Granulocytes: 0 %
Lymphocytes Relative: 6 %
Lymphs Abs: 0.8 10*3/uL (ref 0.7–4.0)
MCH: 29.3 pg (ref 26.0–34.0)
MCHC: 31.6 g/dL (ref 30.0–36.0)
MCV: 92.7 fL (ref 80.0–100.0)
Monocytes Absolute: 1 10*3/uL (ref 0.1–1.0)
Monocytes Relative: 8 %
Neutro Abs: 10.9 10*3/uL — ABNORMAL HIGH (ref 1.7–7.7)
Neutrophils Relative %: 84 %
Platelets: 153 10*3/uL (ref 150–400)
RBC: 3.31 MIL/uL — ABNORMAL LOW (ref 3.87–5.11)
RDW: 17.3 % — ABNORMAL HIGH (ref 11.5–15.5)
WBC: 13 10*3/uL — ABNORMAL HIGH (ref 4.0–10.5)
nRBC: 0 % (ref 0.0–0.2)

## 2022-06-12 LAB — URINALYSIS, ROUTINE W REFLEX MICROSCOPIC
Bilirubin Urine: NEGATIVE
Glucose, UA: NEGATIVE mg/dL
Hgb urine dipstick: NEGATIVE
Ketones, ur: NEGATIVE mg/dL
Nitrite: NEGATIVE
Protein, ur: NEGATIVE mg/dL
Specific Gravity, Urine: 1.019 (ref 1.005–1.030)
pH: 5 (ref 5.0–8.0)

## 2022-06-12 LAB — COMPREHENSIVE METABOLIC PANEL
ALT: 18 U/L (ref 0–44)
AST: 23 U/L (ref 15–41)
Albumin: 2.3 g/dL — ABNORMAL LOW (ref 3.5–5.0)
Alkaline Phosphatase: 139 U/L — ABNORMAL HIGH (ref 38–126)
Anion gap: 7 (ref 5–15)
BUN: 23 mg/dL (ref 8–23)
CO2: 21 mmol/L — ABNORMAL LOW (ref 22–32)
Calcium: 8.1 mg/dL — ABNORMAL LOW (ref 8.9–10.3)
Chloride: 105 mmol/L (ref 98–111)
Creatinine, Ser: 1.12 mg/dL — ABNORMAL HIGH (ref 0.44–1.00)
GFR, Estimated: 55 mL/min — ABNORMAL LOW (ref >60)
Glucose, Bld: 113 mg/dL — ABNORMAL HIGH (ref 70–99)
Potassium: 4.1 mmol/L (ref 3.5–5.1)
Sodium: 133 mmol/L — ABNORMAL LOW (ref 135–145)
Total Bilirubin: 2.1 mg/dL — ABNORMAL HIGH (ref 0.3–1.2)
Total Protein: 5.6 g/dL — ABNORMAL LOW (ref 6.5–8.1)

## 2022-06-12 LAB — BLOOD GAS, VENOUS
Acid-base deficit: 2.5 mmol/L — ABNORMAL HIGH (ref 0.0–2.0)
Bicarbonate: 21.7 mmol/L (ref 20.0–28.0)
O2 Saturation: 77.4 %
Patient temperature: 37
pCO2, Ven: 35 mmHg — ABNORMAL LOW (ref 44–60)
pH, Ven: 7.4 (ref 7.25–7.43)
pO2, Ven: 47 mmHg — ABNORMAL HIGH (ref 32–45)

## 2022-06-12 LAB — RAPID URINE DRUG SCREEN, HOSP PERFORMED
Amphetamines: NOT DETECTED
Barbiturates: NOT DETECTED
Benzodiazepines: NOT DETECTED
Cocaine: NOT DETECTED
Opiates: NOT DETECTED
Tetrahydrocannabinol: NOT DETECTED

## 2022-06-12 LAB — PROTIME-INR
INR: 2 — ABNORMAL HIGH (ref 0.8–1.2)
Prothrombin Time: 23.2 seconds — ABNORMAL HIGH (ref 11.4–15.2)

## 2022-06-12 LAB — LIPASE, BLOOD: Lipase: 52 U/L — ABNORMAL HIGH (ref 11–51)

## 2022-06-12 LAB — AMMONIA: Ammonia: 64 umol/L — ABNORMAL HIGH (ref 9–35)

## 2022-06-12 MED ORDER — ENOXAPARIN SODIUM 60 MG/0.6ML IJ SOSY: 60.0000 mg | PREFILLED_SYRINGE | INTRAMUSCULAR | Status: DC

## 2022-06-12 MED ORDER — ATORVASTATIN CALCIUM 40 MG PO TABS
40.0000 mg | ORAL_TABLET | Freq: Every day | ORAL | Status: DC
  Administered 2022-06-13 – 2022-06-15 (×3): 40 mg via ORAL
  Filled 2022-06-12 (×3): qty 1

## 2022-06-12 MED ORDER — SODIUM CHLORIDE 0.9 % IV SOLN
2.0000 g | Freq: Three times a day (TID) | INTRAVENOUS | Status: DC
  Administered 2022-06-13 – 2022-06-15 (×6): 2 g via INTRAVENOUS
  Filled 2022-06-12 (×7): qty 12.5

## 2022-06-12 MED ORDER — VENLAFAXINE HCL ER 75 MG PO CP24
75.0000 mg | ORAL_CAPSULE | Freq: Every day | ORAL | Status: DC
  Administered 2022-06-13 – 2022-06-15 (×3): 75 mg via ORAL
  Filled 2022-06-12 (×3): qty 1

## 2022-06-12 MED ORDER — MIDODRINE HCL 5 MG PO TABS
5.0000 mg | ORAL_TABLET | Freq: Every day | ORAL | Status: DC | PRN
  Administered 2022-06-13: 5 mg via ORAL
  Filled 2022-06-12: qty 1

## 2022-06-12 MED ORDER — SODIUM CHLORIDE 0.9 % IV SOLN: INTRAVENOUS | Status: AC

## 2022-06-12 MED ORDER — RIFAXIMIN 550 MG PO TABS
550.0000 mg | ORAL_TABLET | Freq: Two times a day (BID) | ORAL | Status: DC
  Administered 2022-06-13 – 2022-06-15 (×5): 550 mg via ORAL
  Filled 2022-06-12 (×6): qty 1

## 2022-06-12 MED ORDER — LEVOTHYROXINE SODIUM 75 MCG PO TABS
150.0000 ug | ORAL_TABLET | Freq: Every day | ORAL | Status: DC
  Administered 2022-06-13 – 2022-06-15 (×3): 150 ug via ORAL
  Filled 2022-06-12 (×3): qty 2

## 2022-06-12 MED ORDER — ALLOPURINOL 100 MG PO TABS
100.0000 mg | ORAL_TABLET | Freq: Every day | ORAL | Status: DC
  Administered 2022-06-13 – 2022-06-15 (×3): 100 mg via ORAL
  Filled 2022-06-12 (×3): qty 1

## 2022-06-12 MED ORDER — PROCHLORPERAZINE EDISYLATE 10 MG/2ML IJ SOLN
10.0000 mg | Freq: Four times a day (QID) | INTRAMUSCULAR | Status: DC | PRN
  Administered 2022-06-12: 10 mg via INTRAVENOUS
  Filled 2022-06-12: qty 2

## 2022-06-12 MED ORDER — ACETAMINOPHEN 325 MG PO TABS
650.0000 mg | ORAL_TABLET | Freq: Four times a day (QID) | ORAL | Status: DC | PRN
  Administered 2022-06-13 – 2022-06-14 (×2): 650 mg via ORAL
  Filled 2022-06-12 (×2): qty 2

## 2022-06-12 MED ORDER — SODIUM CHLORIDE 0.9 % IV SOLN
2.0000 g | INTRAVENOUS | Status: AC
  Administered 2022-06-13: 2 g via INTRAVENOUS
  Filled 2022-06-12: qty 12.5

## 2022-06-12 MED ORDER — LACTULOSE 10 GM/15ML PO SOLN
10.0000 g | Freq: Two times a day (BID) | ORAL | Status: DC
  Administered 2022-06-13: 10 g via ORAL
  Filled 2022-06-12: qty 15

## 2022-06-12 MED ORDER — NEOMYCIN-POLYMYXIN-DEXAMETH 3.5-10000-0.1 OP OINT
1.0000 | TOPICAL_OINTMENT | Freq: Three times a day (TID) | OPHTHALMIC | Status: DC
  Administered 2022-06-13 – 2022-06-15 (×8): 1 via OPHTHALMIC
  Filled 2022-06-12: qty 3.5

## 2022-06-12 MED ORDER — LACTULOSE 10 GM/15ML PO SOLN
10.0000 g | Freq: Once | ORAL | Status: AC
  Administered 2022-06-12: 10 g via ORAL
  Filled 2022-06-12: qty 30

## 2022-06-12 MED ORDER — ACETAMINOPHEN 650 MG RE SUPP
650.0000 mg | Freq: Four times a day (QID) | RECTAL | Status: DC | PRN
  Administered 2022-06-13: 650 mg via RECTAL
  Filled 2022-06-12: qty 1

## 2022-06-12 NOTE — Assessment & Plan Note (Signed)
Continue intravenous PPI for now

## 2022-06-12 NOTE — Assessment & Plan Note (Signed)
Midodrine PRN Parameters written

## 2022-06-12 NOTE — Assessment & Plan Note (Signed)
Check echo 

## 2022-06-12 NOTE — Assessment & Plan Note (Addendum)
Thus far, urinary tract infection is felt to be the most likely etiology of the patient's encephalopathy  CT imaging of the head unremarkable  TSH, vitamin B12 unremarkable Ammonia, while slightly elevated at 53 is near patient's baseline. Obtaining EEG Treating with empiric antibiotic therapy for suspected recurrent urinary tract infection with intravenous cefepime considering history of MDR E. Coli Monitoring for symptomatic improvement

## 2022-06-12 NOTE — Assessment & Plan Note (Signed)
TSH wnl a few days ago.  Continue synthroid daily

## 2022-06-12 NOTE — Assessment & Plan Note (Addendum)
Supportive care  Increasing regimen of lactulose to maintain 2-3 loose stools daily for management of patient's hepatic encephalopathy  Continue rifaximin No evidence of bleeding complications or significant cirrhosis on exam

## 2022-06-12 NOTE — Assessment & Plan Note (Addendum)
Multifactorial with iron deficiency and folate deficiency.   Hemoglobin stable No clinical evidence of bleeding Recent history of blood transfusion on 5/11

## 2022-06-12 NOTE — ED Provider Notes (Signed)
[name] EMERGENCY DEPARTMENT AT [facility] Provider Note   CSN: [id number] Arrival date & time: 06/12/22  1617     History  Chief Complaint  Patient presents with   Altered Mental Status    [name] is a 67 y.o. 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  Her son is present at the bedside reports the patient was behaving normally yesterday, they had gone on a trip to [place].  Abruptly yesterday evening into this morning the patient has had worsening somnolence, confusion, and now is sleeping and difficult to arouse.  He says this is an abrupt change in her mental status.  Per my review of medical records the patient had been discharged from the hospital approximately 3 weeks ago, after admission for sepsis due to UTI, found to be growing multidrug-resistant E. coli that was sensitive to cefepime and treated for 7 days with cefepime in the hospital with improvement of her mental status back to baseline.  She is on Lasix, spironolactone.  She has noted to have liver cirrhosis for which she takes for fixing regularly, and lactulose "as needed".  Her son reports that they do not give her lactulose if she is having 2-3 bowel movements a day, but when she gets constipated she starts taking it.  He says about 2 days ago her bowel movements have "slowed" and he gave her a dose of lactulose 2 days ago, with a very large subsequent bowel movement.  HPI     Home Medications Prior to Admission medications   Medication Sig Start Date End Date Taking? Authorizing Provider  acetaminophen (TYLENOL) 325 MG tablet Take 650 mg by mouth every 6 (six) hours as needed for moderate pain or headache. Patient used this medication for her headache.   Yes [provider]  allopurinol (ZYLOPRIM)  100 MG tablet Take 100 mg by mouth daily. 12/31/21  Yes [provider]  atorvastatin (LIPITOR) 40 MG tablet Take 40 mg by mouth in the morning. 01/19/22  Yes [provider]  colchicine 0.6 MG tablet Take 0.6 mg by mouth daily as needed (gout).   Yes [provider]  cyanocobalamin (VITAMIN B12) 1000 MCG tablet Take 2,000 mcg by mouth daily.   Yes [provider]  EPINEPHrine 0.3 mg/0.3 mL IJ SOAJ injection Inject 0.3 mg into the muscle as needed for anaphylaxis. 08/07/19  Yes [provider]  famotidine (PEPCID) 20 MG tablet Take 1 tablet by mouth daily. 06/08/22  Yes [provider]  furosemide (LASIX) 40 MG tablet Take 1 tablet (40 mg total) by mouth daily. 05/22/22  Yes Leatha Gilding, MD  HYDROcodone-acetaminophen (NORCO) 10-325 MG tablet Take 1 tablet by mouth every 6 (six) hours as needed for moderate pain or severe pain. 02/25/22  Yes [provider]  hydrOXYzine (ATARAX/VISTARIL) 25 MG tablet Take 25 mg by mouth 3 (three) times daily as needed for itching.   Yes [provider]  IRON-VITAMIN C PO Take 1 tablet by mouth daily.   Yes [provider]  lactulose (CHRONULAC) 10 GM/15ML solution Take 15 mLs (10 g total) by mouth 2 (two) times daily. Patient taking differently: Take 10 g by mouth 2 (two) times daily as needed for mild constipation or moderate constipation. 05/10/22  Yes Osvaldo Shipper, MD  levothyroxine (SYNTHROID) 150 MCG tablet Take 150 mcg by mouth  daily before breakfast. 10/26/21  Yes [provider]  magnesium gluconate (MAGONATE) 500 MG tablet Take 1,000 mg by mouth daily.   Yes [provider]  methocarbamol (ROBAXIN) 500 MG tablet Take 500 mg by mouth daily. 03/04/22  Yes [provider]  metoprolol succinate (TOPROL-XL) 25 MG 24 hr tablet Take 12.5 mg by mouth daily as needed (afib).   Yes [provider]  midodrine (PROAMATINE) 5 MG tablet Take 5 mg by mouth  daily as needed (low blood pressure).   Yes [provider]  naloxone (NARCAN) nasal spray 4 mg/0.1 mL Place 1 spray into the nose as needed (accidental overdose). 03/25/22  Yes [provider]  neomycin-polymyxin b-dexamethasone (MAXITROL) 3.5-10000-0.1 OINT Place 1 Application into the left eye 3 (three) times daily.   Yes [provider]  nystatin cream (MYCOSTATIN) Apply topically 2 (two) times daily. Patient taking differently: Apply 1 Application topically 2 (two) times daily as needed for dry skin. 05/22/22  Yes Gherghe, Daylene Katayama, MD  pantoprazole (PROTONIX) 40 MG tablet Take 1 tablet (40 mg total) by mouth daily. 05/10/22  Yes Osvaldo Shipper, MD  promethazine (PHENERGAN) 12.5 MG tablet Take 12.5 mg by mouth every 6 (six) hours as needed for nausea or vomiting.   Yes [provider]  rifaximin (XIFAXAN) 550 MG TABS tablet Take 550 mg by mouth 2 (two) times daily.   Yes [provider]  spironolactone (ALDACTONE) 25 MG tablet Take 0.5 tablets (12.5 mg total) by mouth daily. Patient taking differently: Take 25 mg by mouth daily. 05/22/22  Yes Leatha Gilding, MD  triamcinolone cream (KENALOG) 0.5 % Apply to affected area of right breast twice daily as directed. Patient taking differently: Apply 1 Application topically 2 (two) times daily as needed (rash). 05/10/22  Yes Osvaldo Shipper, MD  venlafaxine XR (EFFEXOR-XR) 75 MG 24 hr capsule Take 75 mg by mouth daily with breakfast. 08/17/21  Yes [provider]  Vitamin D, Ergocalciferol, (DRISDOL) 50000 UNITS CAPS Take 50,000 Units by mouth every 7 (seven) days.    [provider]      Allergies    Ivp dye [iodinated contrast media], Ace inhibitors, Chlorhexidine, Doxycycline, Penicillins, and Zofran [ondansetron]    Review of Systems   Review of Systems  Physical Exam Updated Vital Signs BP 129/61 (BP Location: Left Arm)   Pulse (!) 107   Temp 98.1 F (36.7 C) (Oral)   Resp 20    Ht 5\' 2"  (1.575 m)   Wt 120 kg   SpO2 99%   BMI 48.39 kg/m  Physical Exam Constitutional:      General: She is not in acute distress.    Comments: Somnolent, able to arouse to voice, immediately falls back asleep  HENT:     Head: Normocephalic and atraumatic.  Eyes:     Conjunctiva/sclera: Conjunctivae normal.     Pupils: Pupils are equal, round, and reactive to light.  Cardiovascular:     Rate and Rhythm: Normal rate and regular rhythm.  Pulmonary:     Effort: Pulmonary effort is normal. No respiratory distress.  Abdominal:     General: There is no distension.     Tenderness: There is no abdominal tenderness. There is no guarding.  Skin:    General: Skin is warm and dry.  Neurological:     General: No focal deficit present.     Mental Status: She is alert. Mental status is at baseline.     ED Results /  Procedures / Treatments   Labs (all labs ordered are listed, but only abnormal results are displayed) Labs Reviewed  COMPREHENSIVE METABOLIC PANEL - Abnormal; Notable for the following components:      Result Value   Sodium 133 (*)    CO2 21 (*)    Glucose, Bld 113 (*)    Creatinine, Ser 1.12 (*)    Calcium 8.1 (*)    Total Protein 5.6 (*)    Albumin 2.3 (*)    Alkaline Phosphatase 139 (*)    Total Bilirubin 2.1 (*)    GFR, Estimated 55 (*)    All other components within normal limits  CBC WITH DIFFERENTIAL/PLATELET - Abnormal; Notable for the following components:   WBC 13.0 (*)    RBC 3.31 (*)    Hemoglobin 9.7 (*)    HCT 30.7 (*)    RDW 17.3 (*)    Neutro Abs 10.9 (*)    All other components within normal limits  LIPASE, BLOOD - Abnormal; Notable for the following components:   Lipase 52 (*)    All other components within normal limits  AMMONIA - Abnormal; Notable for the following components:   Ammonia 64 (*)    All other components within normal limits  URINALYSIS, ROUTINE W REFLEX MICROSCOPIC - Abnormal; Notable for the following components:    APPearance HAZY (*)    Leukocytes,Ua TRACE (*)    Bacteria, UA MANY (*)    All other components within normal limits  BLOOD GAS, VENOUS - Abnormal; Notable for the following components:   pCO2, Ven 35 (*)    pO2, Ven 47 (*)    Acid-base deficit 2.5 (*)    All other components within normal limits  URINE CULTURE  RAPID URINE DRUG SCREEN, HOSP PERFORMED  PROTIME-INR  COMPREHENSIVE METABOLIC PANEL  CBC  AMMONIA    EKG None  Radiology CT HEAD WO CONTRAST ( )  Result Date: 06/12/2022 CLINICAL DATA:  Altered mental status EXAM: CT HEAD WITHOUT CONTRAST TECHNIQUE: Contiguous axial images were obtained from the base of the skull through the vertex without intravenous contrast. RADIATION DOSE REDUCTION: This exam was performed according to the departmental dose-optimization program which includes automated exposure control, adjustment of the mA and/or kV according to patient size and/or use of iterative reconstruction technique. COMPARISON:  Head CT 05/16/2022 FINDINGS: Brain: No evidence of acute infarction, hemorrhage, hydrocephalus, extra-axial collection or mass lesion/mass effect. There stable mild patchy periventricular and deep white matter hypodensity. Vascular: Atherosclerotic calcifications are present within the cavernous internal carotid arteries. Skull: Normal. Negative for fracture or focal lesion. Sinuses/Orbits: No acute finding. Other: None. IMPRESSION: 1. No acute intracranial abnormality. 2. Stable mild patchy periventricular and deep white matter hypodensity, likely chronic microvascular disease. Electronically Signed   By: Darliss Cheney M.D.   On: 06/12/2022 21:38   DG Chest Portable 1 View  Result Date: 06/12/2022 CLINICAL DATA:  Concern for pneumonia EXAM: PORTABLE CHEST 1 VIEW COMPARISON:  Chest x-ray dated May 16, 2018 FINDINGS: Cardiac and mediastinal contours within normal limits. Linear opacities of the left hemithorax, unchanged when compared with the prior exam and  likely due to scarring or atelectasis. Lungs are otherwise clear. No evidence of pleural effusion or pneumothorax. IMPRESSION: Linear opacities of the left hemithorax, unchanged when compared with the prior exam and likely due to scarring or atelectasis. No new airspace opacity. Electronically Signed   By: Allegra Lai M.D.   On: 06/12/2022 18:32    Procedures Procedures  Medications Ordered in ED Medications  ceFEPIme (MAXIPIME) 2 g in sodium chloride 0.9 % 100 mL IVPB (has no administration in time range)  ceFEPIme (MAXIPIME) 2 g in sodium chloride 0.9 % 100 mL IVPB (has no administration in time range)  prochlorperazine (COMPAZINE) injection 10 mg (10 mg Intravenous Given 06/12/22 2207)  enoxaparin (LOVENOX) injection 60 mg (has no administration in time range)  acetaminophen (TYLENOL) tablet 650 mg (has no administration in time range)    Or  acetaminophen (TYLENOL) suppository 650 mg (has no administration in time range)  allopurinol (ZYLOPRIM) tablet 100 mg (has no administration in time range)  rifaximin (XIFAXAN) tablet 550 mg (has no administration in time range)  atorvastatin (LIPITOR) tablet 40 mg (has no administration in time range)  venlafaxine XR (EFFEXOR-XR) 24 hr capsule 75 mg (has no administration in time range)  levothyroxine (SYNTHROID) tablet 150 mcg (has no administration in time range)  lactulose (CHRONULAC) 10 GM/15ML solution 10 g (has no administration in time range)  neomycin-polymyxin b-dexamethasone (MAXITROL) ophthalmic ointment 1 Application (has no administration in time range)  midodrine (PROAMATINE) tablet 5 mg (has no administration in time range)  0.9 %  sodium chloride infusion (has no administration in time range)  lactulose (CHRONULAC) 10 GM/15ML solution 10 g (10 g Oral Given 06/12/22 2149)    ED Course/ Medical Decision Making/ A&P Clinical Course as of 06/12/22 2245  Sun Jun 12, 2022  1959 Plan for medical admission for suspected hepatic  encephalopathy; UA does not have convincing evidence of infection at this point, there is a very minor leukocytosis which is nonspecific, no evident infiltrate on chest x-ray.  I do not see an indication for antibiotics and clear evidence of sepsis, will discuss with the hospitalist [MT]  2148 Admitted to dr Artis Flock hospitalist [MT]    Clinical Course User Index [MT] Terald Sleeper, MD                             Medical Decision Making Amount and/or Complexity of Data Reviewed Labs: ordered. Radiology: ordered.  Risk Prescription drug management. Decision regarding hospitalization.   This patient presents to the Emergency Department with complaint of altered mental status.  This involves an extensive number of treatment options, and is a complaint that carries with it a high risk of complications and morbidity.  The differential diagnosis includes hypoglycemia vs metabolic encephalopathy vs infection (including cystitis) vs ICH vs stroke vs polypharmacy vs other  Hepatic encephalopathy is also likely given her history  I ordered, reviewed, and interpreted labs, including mild chronically elevated bilirubin level, creatinine near baseline.  Ammonia is 64, stable from outpatient.  Lipase within normal is.  Nonspecific minor leukocytosis white blood cell count 13.0.  UA without significant evidence of infection, only trace leukocytes, negative nitrites I ordered medication lactulose for suspected hepatic encephalopathy I ordered imaging studies which included x-ray of the chest I independently visualized and interpreted imaging which showed no focal abnormalities, limited by habitus and the monitor tracing which showed sinus rhythm Additional history was obtained from patient's son at bedside Previous records obtained and reviewed showing hospital discharge summary I personally reviewed the patients ECG which showed sinus rhythm with no acute ischemic findings  Based on this presentation  suspect the patient most likely is experiencing hepatic encephalopathy, given her somnolence.  This seems less consistent with sepsis, infection.  Results no report of head injury according to her son's been  with her during her past week.  I did discuss the case with the hospitalist, and deferred decision for antibiotics to hospitalist.         Final Clinical Impression(s) / ED Diagnoses Final diagnoses:  Altered mental status, unspecified altered mental status type    Rx / DC Orders ED Discharge Orders     None         , Kermit Balo, MD 06/12/22 2245

## 2022-06-12 NOTE — Progress Notes (Signed)
Pharmacy Antibiotic Note  [name] is a 67 y.o. female admitted on 06/12/2022 with UTI.  Pharmacy has been consulted for Cefepime dosing.  Hx Ecoli UTI ~ 1 month ago sensitive only to Cefepime, gentamicin, nitrofurantoin  Plan: Cefepime 2gm IV q8h No dose adjustments anticipated.  Pharmacy will sign off and monitor peripherally via electronic surveillance software for any changes in renal function or micro data.   Height: 5\' 2"  (157.5 cm) Weight: 120 kg (264 lb 8.8 oz) IBW/kg (Calculated) : 50.1  Temp (24hrs), Avg:98.6 F (37 C), Min:98.6 F (37 C), Max:98.6 F (37 C)  Recent Labs  Lab 06/12/22 1900  WBC 13.0*  CREATININE 1.12*    Estimated Creatinine Clearance: 62.6 mL/min (A) (by C-G formula based on SCr of 1.12 mg/dL (H)).    Allergies  Allergen Reactions   Ivp Dye [Iodinated Contrast Media] Anaphylaxis   Ace Inhibitors Swelling   Chlorhexidine     Pt has a rash, & made irritation skin further   Doxycycline Rash   Penicillins Rash   Zofran [Ondansetron] Rash    Thank you for allowing pharmacy to be a part of this patient's care.  Junita Push PharmD 06/12/2022 9:44 PM

## 2022-06-12 NOTE — ED Notes (Signed)
IV attempted without success. However was able to obtain all blood work except for venous blood gas

## 2022-06-12 NOTE — Assessment & Plan Note (Signed)
Blind in left eye Continue drops

## 2022-06-12 NOTE — H&P (Addendum)
History and Physical    Patient: [name] ZOX:[id number] DOB: 24-Aug-1955 DOA: 06/12/2022 DOS: the patient was seen and examined on 06/12/2022 PCP: [name], MD  Patient coming from: Home - lives with her son. Ambulates with walker.    Chief Complaint: AMS   HPI: [name] is a 67 y.o. female with medical history significant of liver cirrhosis due to NASH, HTN, morbid obesity, candidal skin infections, hx of PE not on AC in 2014, IDA, chronic diarrhea, HTN, GERD, and short gut syndrome who was admitted in April with septic shock and again recently on 5/6 for sepsis from a UTI. Culture showed resistant UTI sensitive to cefepime. Her son gives history.  The just took a vacation to [place] and got back yesterday.  She did great on the trip. Today she abruptly became confused mid morning. She became very sleepy and hard to arouse. She was able to tell him she didn't feel good and was thirsty. She started to not make sense when she was talking which prompted him to bring her to ED. She also had one episode of vomiting earlier today after she drank a lot of water quickly.   She is not on lactulose regularly on as needed when she is not having 2-3 BM/day per son.   Per son she has not had any fever/chills, vision changes/headaches, chest pain or palpitations, shortness of breath or cough, abdominal pain, dysuria or leg swelling.   She is able to tell me she has no headaches, stomach pain or dysuria. She does have more urgency and frequency. She denies any back pain.   She doesn't smoke or drink alcohol.    ER Course:  vitals: afebrile, bp: 128/73, HR: 100, RR: 16, oxygen: 100%RA Pertinent labs: wbc: 13, hgb: 9.7, t.bili: 2.1, ammonia 64,  CXR: no acute finding when compared to previous CXR.  In ED: lactulose ordered, TRH asked to admit.    Review of Systems: unable to review all systems due to the inability of the patient to answer questions. Past Medical History:  Diagnosis  Date   Chronic diarrhea    Cirrhosis, 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   pt stated on admission history review   Heart murmur    Hypertension    Kidney stone    Obesity    Pulmonary embolism (HCC)    Short gut syndrome    Thyroid disease    Past Surgical History:  Procedure Laterality Date   CHOLECYSTECTOMY     COLON SURGERY     HERNIA REPAIR     ILEOSTOMY     ILEOSTOMY CLOSURE     KIDNEY STONE SURGERY     WRIST SURGERY     Social History:  reports that she quit smoking about 37 years ago. Her smoking use included cigarettes. She has been exposed to tobacco smoke. She has never used smokeless tobacco. She reports that she does not drink alcohol and does not use drugs.  Allergies  Allergen Reactions   Ivp Dye [Iodinated Contrast Media] Anaphylaxis   Ace Inhibitors Swelling   Chlorhexidine     Pt has a rash, & made irritation skin further   Doxycycline Rash   Penicillins Rash   Zofran [Ondansetron] Rash    History reviewed. No pertinent family history.  Prior to Admission medications   Medication Sig Start Date End Date Taking? Authorizing Provider  acetaminophen (TYLENOL) 325 MG tablet Take 650 mg by mouth every 6 (six) hours as needed for moderate pain or headache.  Patient used this medication for her headache.    [provider]  allopurinol (ZYLOPRIM) 100 MG tablet Take 100 mg by mouth daily. 12/31/21   [provider]  atorvastatin (LIPITOR) 40 MG tablet Take 40 mg by mouth in the morning. 01/19/22   [provider]  colchicine 0.6 MG tablet Take 0.6 mg by mouth daily as needed (gout).    [provider]  cyanocobalamin (VITAMIN B12) 1000 MCG tablet Take 2,000 mcg by mouth daily.    [provider]  EPINEPHrine 0.3 mg/0.3 mL IJ SOAJ injection Inject 0.3 mg into the muscle as needed for anaphylaxis. 08/07/19   [provider]  furosemide (LASIX) 40 MG tablet Take 1 tablet (40 mg total) by mouth daily. 05/22/22   Leatha Gilding, MD  HYDROcodone-acetaminophen (NORCO) 10-325 MG tablet Take 1 tablet by mouth every 6 (six) hours as needed for moderate pain or severe pain. 02/25/22   [provider]  hydrOXYzine (ATARAX/VISTARIL) 25 MG tablet Take 25 mg by mouth 3 (three) times daily as needed for itching.    [provider]  IRON-VITAMIN C PO Take 1 tablet by mouth daily.    [provider]  lactulose (CHRONULAC) 10 GM/15ML solution Take 15 mLs (10 g total) by mouth 2 (two) times daily. Patient not taking: Reported on 05/17/2022 05/10/22   Osvaldo Shipper, MD  levothyroxine (SYNTHROID) 150 MCG tablet Take 150 mcg by mouth daily before breakfast. 10/26/21   [provider]  magnesium oxide (MAG-OX) 400 MG tablet Take 400 mg by mouth daily.    [provider]  methocarbamol (ROBAXIN) 500 MG tablet Take 500 mg by mouth daily. 03/04/22   [provider]  metoprolol succinate (TOPROL-XL) 25 MG 24 hr tablet Take 12.5 mg by mouth daily as needed (afib).    [provider]  midodrine (PROAMATINE) 5 MG tablet Take 5 mg by mouth daily as needed (low blood pressure).    [provider]  naloxone [facility]) nasal spray 4 mg/0.1 mL Place 1 spray into the nose as needed (accidental overdose). 03/25/22   [provider]  nystatin cream (MYCOSTATIN) Apply topically 2 (two) times daily. 05/22/22   Leatha Gilding, MD  pantoprazole (PROTONIX) 40 MG tablet Take 1 tablet (40 mg total) by mouth daily. 05/10/22   Osvaldo Shipper, MD  promethazine (PHENERGAN) 12.5 MG tablet Take 12.5 mg by mouth every 6 (six) hours as needed for nausea or vomiting.    [provider]  rifaximin (XIFAXAN) 550 MG TABS tablet Take 550 mg by mouth 2 (two) times daily.    [provider]  spironolactone (ALDACTONE) 25 MG tablet Take 0.5 tablets (12.5 mg total) by mouth daily. 05/22/22   Leatha Gilding, MD  triamcinolone cream (KENALOG) 0.5 % Apply to affected area of  right breast twice daily as directed. 05/10/22   Osvaldo Shipper, MD  venlafaxine XR (EFFEXOR-XR) 75 MG 24 hr capsule Take 75 mg by mouth daily with breakfast. 08/17/21   [provider]  Vitamin D, Ergocalciferol, (DRISDOL) 50000 UNITS CAPS Take 50,000 Units by mouth every 7 (seven) days.    [provider]    Physical Exam: Vitals:   06/12/22 1830 06/12/22 2019 06/12/22 2100 06/12/22 2239  BP: 101/62  (!) 121/59 129/61  Pulse: (!) 101  93 (!) 107  Resp: 20  (!) 24 20  Temp:  98.6 F (37 C)  98.1 F (36.7 C)  TempSrc:    Oral  SpO2: 100%  100% 99%  Weight:      Height:       General:  Appears calm and comfortable and is in NAD. Obese, lethargic but responds to some questions and follows simple commands  Eyes:  PERRL, EOMI, normal lids, iris ENT:  grossly normal hearing, lips & tongue, mmm; poor dentition  Neck:  no LAD, masses or thyromegaly; no carotid bruits Cardiovascular:  RRR, systolic murmur  Trace LE edema  Respiratory:   CTA bilaterally with no wheezes/rales/rhonchi.  Normal respiratory effort. Abdomen:  soft, NT, ND, NABS Back:   normal alignment, no CVAT Skin:  no rash or induration seen on limited exam Musculoskeletal:  grossly normal tone BUE/BLE, good ROM, no bony abnormality Lower extremity:  Limited foot exam with no ulcerations.  2+ distal pulses. Psychiatric:  sleepy. Speech fluent when answers. Follows some simple commands. Alert to self and birthday. Knows her son.  Neurologic:  CN 2-12 grossly intact.limited exam. Would do everything eyes. No obvious neurologic abnormality. Blind in left eye    Radiological Exams on Admission: Independently reviewed - see discussion in A/P where applicable  CT HEAD WO CONTRAST ( )  Result Date: 06/12/2022 CLINICAL DATA:  Altered mental status EXAM: CT HEAD WITHOUT CONTRAST TECHNIQUE: Contiguous axial images were obtained from the base of the skull through the vertex without intravenous contrast. RADIATION  DOSE REDUCTION: This exam was performed according to the departmental dose-optimization program which includes automated exposure control, adjustment of the mA and/or kV according to patient size and/or use of iterative reconstruction technique. COMPARISON:  Head CT 05/16/2022 FINDINGS: Brain: No evidence of acute infarction, hemorrhage, hydrocephalus, extra-axial collection or mass lesion/mass effect. There stable mild patchy periventricular and deep white matter hypodensity. Vascular: Atherosclerotic calcifications are present within the cavernous internal carotid arteries. Skull: Normal. Negative for fracture or focal lesion. Sinuses/Orbits: No acute finding. Other: None. IMPRESSION: 1. No acute intracranial abnormality. 2. Stable mild patchy periventricular and deep white matter hypodensity, likely chronic microvascular disease. Electronically Signed   By: Darliss Cheney M.D.   On: 06/12/2022 21:38   DG Chest Portable 1 View  Result Date: 06/12/2022 CLINICAL DATA:  Concern for pneumonia EXAM: PORTABLE CHEST 1 VIEW COMPARISON:  Chest x-ray dated May 16, 2018 FINDINGS: Cardiac and mediastinal contours within normal limits. Linear opacities of the left hemithorax, unchanged when compared with the prior exam and likely due to scarring or atelectasis. Lungs are otherwise clear. No evidence of pleural effusion or pneumothorax. IMPRESSION: Linear opacities of the left hemithorax, unchanged when compared with the prior exam and likely due to scarring or atelectasis. No new airspace opacity. Electronically Signed   By: Allegra Lai M.D.   On: 06/12/2022 18:32    EKG: pending   Labs on Admission: I have personally reviewed the available labs and imaging studies at the time of the admission.  Pertinent labs:   wbc: 13,  hgb: 9.7,  t.bili: 2.1,  ammonia 64,  Assessment and Plan: Principal Problem:   Acute encephalopathy Active Problems:   Cirrhosis due to NASH   GERD (gastroesophageal reflux  disease)   Hypothyroidism   Iron deficiency anemia   Hypotension   History of eye surgery    Assessment and Plan: * Acute encephalopathy 67 year old with history of NASH cirrhosis who presented to ED with acute onset of confusion/altered mental state -obs to tele -will wake up and answer questions appropriately, knows her son/birthday and follows simple directions- but quickly falls back asleep  -  has good laryngeal elevation and protecting airway. Will give meds, but otherwise NPO until wakes up more  -check CT head. Neuro exam limited, but wnl with what she will participate in-WNL  -metabolic labs (B12/TSH)  just checked on 5/29 and wnl -ammonia slightly elevated today at 64, but it was 71 on 5/29 when she was her normal self. Not convinced she has decompensated cirrhosis. Lactulose ordered in ED. Son only gives this to her PRN when not having regular BM. Will trend ammonia -? UTI. SIRS criteria initially met. Urine with many bacteria/WBC. Had resistant e.coli in 05/2022. Will culture and start cefepime which was sensitive to back in May. NO other source of infection  -VBG pending. No hx of OSA.  -aspiration precautions  -check UDS  -hold pain medication and change phenergan to compazine since allergy to zofran  -son stated his mom takes amitriptyline at night for sleep and took one last night. Do not see on med rec and would avoid combo with effexor.  -also son changed medication on his own accord and increased spironolactone and lasix.  History difficult to understand b/c "she wasn't urinating as much". Slight bump in renal function -gentle,time limited IVF x 6 hours as volume down from increased diuretics    Cirrhosis due to NASH Check INR LFTS wnl Bilirubin at baseline  Not convinced she has decompensated cirrhosis as ammonia better than it was a few days ago at PCP when mentally intact  Ammonia close to her baseline Give dose of lactulose now and follow. Write for scheduled twice  a day, he is only giving Prn  Continue rifaximin  NPO until more alert Hold lasix/spiro as bp low for now -? Son increased lasix/spironolactone to make her urinate more. History difficult Aspiration precautions   GERD (gastroesophageal reflux disease) Continue PPI, change to IV until more alert   Hypothyroidism TSH wnl a few days ago.  Continue synthroid daily  Iron deficiency anemia Hgb at baseline  Continue to monitor closely   Hypotension Midodrine PRN Parameters written   History of eye surgery Blind in left eye Continue drops     Advance Care Planning:   Code Status: Full Code   Consults: none   DVT Prophylaxis: INR 2.0. SCDs for now.   Family Communication: son at bedside  Severity of Illness: The appropriate patient status for this patient is OBSERVATION. Observation status is judged to be reasonable and necessary in order to provide the required intensity of service to ensure the patient's safety. The patient's presenting symptoms, physical exam findings, and initial radiographic and laboratory data in the context of their medical condition is felt to place them at decreased risk for further clinical deterioration. Furthermore, it is anticipated that the patient will be medically stable for discharge from the hospital within 2 midnights of admission.   Author: Orland Mustard, MD 06/12/2022 10:46 PM  For on call review [email or web address].

## 2022-06-12 NOTE — ED Triage Notes (Addendum)
Patient has increasingly become weaker over the day, drinking more than normal, more fatigued and altered per son. Patient thinks her son is her mom. Was normal yesterday per son. History of UTI and high ammonia. Has cirrhosis.

## 2022-06-13 ENCOUNTER — Inpatient Hospital Stay (HOSPITAL_COMMUNITY): Payer: Medicare (Managed Care)

## 2022-06-13 ENCOUNTER — Observation Stay (HOSPITAL_COMMUNITY): Payer: Medicare (Managed Care)

## 2022-06-13 DIAGNOSIS — E538 Deficiency of other specified B group vitamins: Secondary | ICD-10-CM

## 2022-06-13 DIAGNOSIS — K90829 Short bowel syndrome, unspecified: Secondary | ICD-10-CM | POA: Diagnosis present

## 2022-06-13 DIAGNOSIS — D509 Iron deficiency anemia, unspecified: Secondary | ICD-10-CM | POA: Diagnosis present

## 2022-06-13 DIAGNOSIS — K7682 Hepatic encephalopathy: Secondary | ICD-10-CM | POA: Diagnosis present

## 2022-06-13 DIAGNOSIS — R791 Abnormal coagulation profile: Secondary | ICD-10-CM | POA: Diagnosis present

## 2022-06-13 DIAGNOSIS — R011 Cardiac murmur, unspecified: Secondary | ICD-10-CM | POA: Diagnosis not present

## 2022-06-13 DIAGNOSIS — I959 Hypotension, unspecified: Secondary | ICD-10-CM | POA: Diagnosis present

## 2022-06-13 DIAGNOSIS — H5462 Unqualified visual loss, left eye, normal vision right eye: Secondary | ICD-10-CM | POA: Diagnosis present

## 2022-06-13 DIAGNOSIS — E039 Hypothyroidism, unspecified: Secondary | ICD-10-CM

## 2022-06-13 DIAGNOSIS — I10 Essential (primary) hypertension: Secondary | ICD-10-CM

## 2022-06-13 DIAGNOSIS — K7581 Nonalcoholic steatohepatitis (NASH): Secondary | ICD-10-CM | POA: Diagnosis present

## 2022-06-13 DIAGNOSIS — K7469 Other cirrhosis of liver: Secondary | ICD-10-CM | POA: Diagnosis not present

## 2022-06-13 DIAGNOSIS — K7460 Unspecified cirrhosis of liver: Secondary | ICD-10-CM

## 2022-06-13 DIAGNOSIS — R569 Unspecified convulsions: Secondary | ICD-10-CM | POA: Diagnosis not present

## 2022-06-13 DIAGNOSIS — R4182 Altered mental status, unspecified: Secondary | ICD-10-CM | POA: Diagnosis present

## 2022-06-13 DIAGNOSIS — Z87891 Personal history of nicotine dependence: Secondary | ICD-10-CM | POA: Diagnosis not present

## 2022-06-13 DIAGNOSIS — K219 Gastro-esophageal reflux disease without esophagitis: Secondary | ICD-10-CM

## 2022-06-13 DIAGNOSIS — N179 Acute kidney failure, unspecified: Secondary | ICD-10-CM | POA: Diagnosis present

## 2022-06-13 DIAGNOSIS — I080 Rheumatic disorders of both mitral and aortic valves: Secondary | ICD-10-CM | POA: Diagnosis present

## 2022-06-13 DIAGNOSIS — D696 Thrombocytopenia, unspecified: Secondary | ICD-10-CM | POA: Diagnosis present

## 2022-06-13 DIAGNOSIS — Z6841 Body Mass Index (BMI) 40.0 and over, adult: Secondary | ICD-10-CM | POA: Diagnosis not present

## 2022-06-13 DIAGNOSIS — Z79890 Hormone replacement therapy (postmenopausal): Secondary | ICD-10-CM | POA: Diagnosis not present

## 2022-06-13 DIAGNOSIS — G9341 Metabolic encephalopathy: Secondary | ICD-10-CM | POA: Diagnosis present

## 2022-06-13 DIAGNOSIS — B9620 Unspecified Escherichia coli [E. coli] as the cause of diseases classified elsewhere: Secondary | ICD-10-CM

## 2022-06-13 DIAGNOSIS — Z9889 Other specified postprocedural states: Secondary | ICD-10-CM

## 2022-06-13 DIAGNOSIS — N390 Urinary tract infection, site not specified: Secondary | ICD-10-CM | POA: Diagnosis present

## 2022-06-13 DIAGNOSIS — E860 Dehydration: Secondary | ICD-10-CM | POA: Diagnosis present

## 2022-06-13 DIAGNOSIS — Z86711 Personal history of pulmonary embolism: Secondary | ICD-10-CM | POA: Diagnosis not present

## 2022-06-13 DIAGNOSIS — Z1611 Resistance to penicillins: Secondary | ICD-10-CM | POA: Diagnosis present

## 2022-06-13 DIAGNOSIS — G9340 Encephalopathy, unspecified: Secondary | ICD-10-CM | POA: Diagnosis not present

## 2022-06-13 LAB — CBC
HCT: 29.6 % — ABNORMAL LOW (ref 36.0–46.0)
Hemoglobin: 9.3 g/dL — ABNORMAL LOW (ref 12.0–15.0)
MCH: 30 pg (ref 26.0–34.0)
MCHC: 31.4 g/dL (ref 30.0–36.0)
MCV: 95.5 fL (ref 80.0–100.0)
Platelets: 136 10*3/uL — ABNORMAL LOW (ref 150–400)
RBC: 3.1 MIL/uL — ABNORMAL LOW (ref 3.87–5.11)
RDW: 17.4 % — ABNORMAL HIGH (ref 11.5–15.5)
WBC: 12.4 10*3/uL — ABNORMAL HIGH (ref 4.0–10.5)
nRBC: 0 % (ref 0.0–0.2)

## 2022-06-13 LAB — COMPREHENSIVE METABOLIC PANEL
ALT: 16 U/L (ref 0–44)
AST: 23 U/L (ref 15–41)
Albumin: 2.1 g/dL — ABNORMAL LOW (ref 3.5–5.0)
Alkaline Phosphatase: 112 U/L (ref 38–126)
Anion gap: 7 (ref 5–15)
BUN: 22 mg/dL (ref 8–23)
CO2: 19 mmol/L — ABNORMAL LOW (ref 22–32)
Calcium: 7.8 mg/dL — ABNORMAL LOW (ref 8.9–10.3)
Chloride: 106 mmol/L (ref 98–111)
Creatinine, Ser: 1.06 mg/dL — ABNORMAL HIGH (ref 0.44–1.00)
GFR, Estimated: 59 mL/min — ABNORMAL LOW (ref >60)
Glucose, Bld: 80 mg/dL (ref 70–99)
Potassium: 4.3 mmol/L (ref 3.5–5.1)
Sodium: 132 mmol/L — ABNORMAL LOW (ref 135–145)
Total Bilirubin: 2.6 mg/dL — ABNORMAL HIGH (ref 0.3–1.2)
Total Protein: 4.8 g/dL — ABNORMAL LOW (ref 6.5–8.1)

## 2022-06-13 LAB — ECHOCARDIOGRAM COMPLETE
AR max vel: 1.23 cm2
AV Area VTI: 1.28 cm2
AV Area mean vel: 1.28 cm2
AV Mean grad: 22.2 mmHg
AV Peak grad: 37.1 mmHg
Ao pk vel: 3.04 m/s
Area-P 1/2: 4.79 cm2
Calc EF: 57.6 %
Height: 62 in
MV VTI: 2.71 cm2
S' Lateral: 3.2 cm
Single Plane A2C EF: 58.1 %
Single Plane A4C EF: 57.8 %
Weight: 4232.83 oz

## 2022-06-13 LAB — VITAMIN B12: Vitamin B-12: 1585 pg/mL — ABNORMAL HIGH (ref 180–914)

## 2022-06-13 LAB — AMMONIA: Ammonia: 53 umol/L — ABNORMAL HIGH (ref 9–35)

## 2022-06-13 LAB — TSH: TSH: 2.787 u[IU]/mL (ref 0.350–4.500)

## 2022-06-13 LAB — C-REACTIVE PROTEIN: CRP: 4.6 mg/dL — ABNORMAL HIGH (ref <1.0)

## 2022-06-13 LAB — FOLATE: Folate: 3.5 ng/mL — ABNORMAL LOW (ref >5.9)

## 2022-06-13 MED ORDER — SODIUM CHLORIDE 0.9 % IV SOLN: 1.0000 mg | Freq: Once | INTRAVENOUS | Status: DC

## 2022-06-13 MED ORDER — ORAL CARE MOUTH RINSE: 15.0000 mL | OROMUCOSAL | Status: DC | PRN

## 2022-06-13 MED ORDER — GADOBUTROL 1 MMOL/ML IV SOLN
10.0000 mL | Freq: Once | INTRAVENOUS | Status: AC | PRN
  Administered 2022-06-13: 10 mL via INTRAVENOUS

## 2022-06-13 MED ORDER — FOLIC ACID 5 MG/ML IJ SOLN
1.0000 mg | Freq: Once | INTRAMUSCULAR | Status: DC
  Filled 2022-06-13: qty 0.2

## 2022-06-13 MED ORDER — FOLIC ACID 1 MG PO TABS: 1.0000 mg | ORAL_TABLET | Freq: Every day | ORAL | Status: DC

## 2022-06-13 MED ORDER — LACTULOSE 10 GM/15ML PO SOLN
20.0000 g | Freq: Three times a day (TID) | ORAL | Status: DC
  Administered 2022-06-13 – 2022-06-15 (×6): 20 g via ORAL
  Filled 2022-06-13 (×6): qty 30

## 2022-06-13 MED ORDER — FOLIC ACID 1 MG PO TABS
1.0000 mg | ORAL_TABLET | Freq: Every day | ORAL | Status: DC
  Administered 2022-06-14 – 2022-06-15 (×2): 1 mg via ORAL
  Filled 2022-06-13 (×2): qty 1

## 2022-06-13 MED ORDER — ORAL CARE MOUTH RINSE
15.0000 mL | OROMUCOSAL | Status: DC
  Administered 2022-06-13 – 2022-06-15 (×9): 15 mL via OROMUCOSAL

## 2022-06-13 NOTE — Plan of Care (Signed)
  Problem: Fluid Volume: Goal: Hemodynamic stability will improve Outcome: Progressing   Problem: Respiratory: Goal: Ability to maintain adequate ventilation will improve Outcome: Progressing   

## 2022-06-13 NOTE — Hospital Course (Signed)
67 year old female with past medical history of cirrhosis due to NASH,, prior pulmonary embolism, hypertension, chronic diarrhea due to short gut syndrome, iron deficiency anemia, gastroesophageal reflux disease who presented to [facility] emergency department with increasing weakness and confusion.  Of note, patient was recently hospitalized from 5/6 until 5/12 for sepsis felt to be secondary to a combination of multidrug-resistant E. coli urinary tract infection sensitive to cefepime on urine culture sensitive to cefepime and right breast mastitis.  Patient was discharged after 7 days of antibiotics with improving mentation.  Upon repeat evaluation in the emergency department on this presentation patient is developed recurrent acute metabolic encephalopathy.  Patient is been placed on empiric antibiotics with cefepime as well as lactulose due to somewhat elevated ammonia in the hospital group has been called to assess the patient for admission to the hospital.

## 2022-06-13 NOTE — Progress Notes (Incomplete)
PROGRESS NOTE   [name]  WUJ:[id number] DOB: 05-Mar-1955 DOA: 06/12/2022 PCP: [name], MD   Date of Service: the patient was seen and examined on 06/13/2022  Brief Narrative:  67 year old female with past medical history of cirrhosis due to NASH,, prior pulmonary embolism, hypertension, chronic diarrhea due to short gut syndrome, iron deficiency anemia, gastroesophageal reflux disease who presented to [facility] emergency department with increasing weakness and confusion.  Of note, patient was recently hospitalized from 5/6 until 5/12 for sepsis felt to be secondary to a combination of multidrug-resistant E. coli urinary tract infection sensitive to cefepime on urine culture sensitive to cefepime and right breast mastitis.  Patient was discharged after 7 days of antibiotics with improving mentation.  Upon repeat evaluation in the emergency department on this presentation patient is developed recurrent acute metabolic encephalopathy.  Patient is been placed on empiric antibiotics with cefepime as well as lactulose due to somewhat elevated ammonia in the hospital group has been called to assess the patient for admission to the hospital.   Assessment and Plan: * Acute metabolic encephalopathy Thus far, urinary tract infection is felt to be the most likely etiology of the patient's encephalopathy  CT imaging of the head unremarkable  TSH, vitamin B12 unremarkable Ammonia, while slightly elevated at 53 is near patient's baseline. Obtaining EEG Treating with empiric antibiotic therapy for suspected recurrent urinary tract infection with intravenous cefepime considering history of MDR E. Coli Monitoring for symptomatic improvement  E-coli UTI Urinalysis somewhat suggestive of infection Urine culture pending Treating with cefepime in the meantime based on previous culture sensitivities for E. Coli   GERD (gastroesophageal reflux disease) Continue intravenous PPI for  now  Cirrhosis due to NASH Supportive care  Increasing regimen of lactulose to maintain 2-3 loose stools daily for management of patient's hepatic encephalopathy  Continue rifaximin No evidence of bleeding complications or significant cirrhosis on exam  Hypothyroidism Continue synthroid daily  Iron deficiency anemia Multifactorial with iron deficiency and folate deficiency.   Hemoglobin stable No clinical evidence of bleeding Recent history of blood transfusion on 5/11   History of eye surgery Blind in left eye Continue drops   Folate deficiency Patient identified to have substantial folate deficiency Likely contributing to patient's anemia Initiating supplementation  Systolic murmur Check echo   Hypotension-resolved as of 06/14/2022 Midodrine PRN Parameters written     Subjective:  Patient unable to answer questions appropriately due to lethargy and confusion.  Physical Exam:  Vitals:   06/13/22 0448 06/13/22 1337 06/13/22 2044   BP: (!) 96/55 (!) 121/57 98/60   Pulse: (!) 110 83 89   Resp: 20 20 16    Temp: 98.4 F (36.9 C) (!) 97.3 F (36.3 C) 97.8 F (36.6 C)   TempSrc: Oral Oral Oral   SpO2: 98% 98% 95%   Weight:      Height:         Constitutional: Patient is urgent but arousable, no associated distress.   Skin: no rashes, no lesions, good skin turgor noted. Eyes: Pupils are equally reactive to light.  No evidence of scleral icterus or conjunctival pallor.  ENMT: Moist mucous membranes noted.  Posterior pharynx clear of any exudate or lesions.   Respiratory: Scattered rhonchi bilaterally with mild bibasilar rales.  Normal respiratory effort. No accessory muscle use.  Cardiovascular: Regular rate and rhythm, no murmurs / rubs / gallops. No extremity edema. 2+ pedal pulses. No carotid bruits.  Abdomen: Abdomen is soft and nontender.  No evidence of intra-abdominal masses.  Positive bowel sounds noted in all quadrants.   Musculoskeletal: No joint  deformity upper and lower extremities. Good ROM, no contractures. Normal muscle tone.    Data Reviewed:  I have personally reviewed and interpreted labs, imaging.  Significant findings are   CBC: Recent Labs  Lab 06/12/22 1900 06/13/22 0446  WBC 13.0* 12.4*  NEUTROABS 10.9*  --   HGB 9.7* 9.3*  HCT 30.7* 29.6*  MCV 92.7 95.5  PLT 153 136*   Basic Metabolic Panel: Recent Labs  Lab 06/12/22 1900 06/13/22 0446  NA 133* 132*  K 4.1 4.3  CL 105 106  CO2 21* 19*  GLUCOSE 113* 80  BUN 23 22  CREATININE 1.12* 1.06*  CALCIUM 8.1* 7.8*   GFR: Estimated Creatinine Clearance: 66.1 mL/min (A) (by C-G formula based on SCr of 1.06 mg/dL (H)). Liver Function Tests: Recent Labs  Lab 06/12/22 1900 06/13/22 0446  AST 23 23  ALT 18 16  ALKPHOS 139* 112  BILITOT 2.1* 2.6*  PROT 5.6* 4.8*  ALBUMIN 2.3* 2.1*    Coagulation Profile: Recent Labs  Lab 06/12/22 2303  INR 2.0*       Code Status:  Full code.  Code status decision has been confirmed with: son at the beside Family Communication: Son is at the bedside and has been updated on plan of care.   Severity of Illness:  The appropriate patient status for this patient is INPATIENT. Inpatient status is judged to be reasonable and necessary in order to provide the required intensity of service to ensure the patient's safety. The patient's presenting symptoms, physical exam findings, and initial radiographic and laboratory data in the context of their chronic comorbidities is felt to place them at high risk for further clinical deterioration. Furthermore, it is not anticipated that the patient will be medically stable for discharge from the hospital within 2 midnights of admission.   * I certify that at the point of admission it is my clinical judgment that the patient will require inpatient hospital care spanning beyond 2 midnights from the point of admission due to high intensity of service, high risk for further  deterioration and high frequency of surveillance required.*  Time spent:  59 minutes  Author:  Marinda Elk MD  06/13/2022

## 2022-06-13 NOTE — Progress Notes (Signed)
Mobility Specialist - Progress Note   06/13/22 1024  Mobility  Activity  (Bed Level Exercises)  Range of Motion/Exercises Passive;Right leg;Left leg  Activity Response Tolerated well  Mobility Referral Yes  $Mobility charge 1 Mobility  Mobility Specialist Start Time (ACUTE ONLY) 0959  Mobility Specialist Stop Time (ACUTE ONLY) 1015  Mobility Specialist Time Calculation (min) (ACUTE ONLY) 16 min   Pt received in bed and agreeable to do bed level exercises. This session was focused on lower extremities. Pt required verbal cues to stay on track. See below for exercises. No complaints during session. At EOS assisted NT with getting pt on bed pan. Pt to bed after session with all needs met & NT in room.    Supine BLE exercises: 10 reps each  1) Ankle Pumps (Passive)  2) Heel Slides (Passive)  3) Hip Abduction /Adduction (Passive)  4) Straight Leg Raise (Passive)     Chief Technology Officer

## 2022-06-13 NOTE — TOC Initial Note (Signed)
Transition of Care [facility]) - Initial/Assessment Note    Patient Details  Name: [name] MRN: [id number] Date of Birth: Apr 18, 1955  Transition of Care [facility]) CM/SW Contact:    [name], RN Phone Number: 06/13/2022, 1:28 PM  Clinical Narrative:   Met with pt/son [name]) at bedside to introduce role of TOC/NCM and review for dc plans, pt sleeping during interview, son providing information, reports pt has a PCP in place, a pharmacy, pt has potty chair, Rollator, w/c, walker, hospital bed in the home. [name] confirmed pt currently receiving HH RN/PT through [facility]. Text received from [place] with [facility] confirming pt receiving HH RN/PT.             Expected Discharge Plan: Home w Home Health Services Barriers to Discharge: Continued Medical Work up   Patient Goals and CMS Choice Patient states their goals for this hospitalization and ongoing recovery are:: Return home with resumption of HH PT/RN through [facility] CMS Medicare.gov Compare Post Acute Care list provided to:: Patient Represenative (must comment) [name] (son)) Choice offered to / list presented to : Adult Children      Expected Discharge Plan and Services   Discharge Planning Services: CM Consult   Living arrangements for the past 2 months: Single Family Home                                      Prior Living Arrangements/Services Living arrangements for the past 2 months: Single Family Home Lives with:: Adult Children Patient language and need for interpreter reviewed:: Yes Do you feel safe going back to the place where you live?: Yes      Need for Family Participation in Patient Care: Yes (Comment) Care giver support system in place?: Yes (comment) Current home services: Home PT, Home RN ([facility]) Criminal Activity/Legal Involvement Pertinent to Current Situation/Hospitalization: No - Comment as needed  Activities of Daily Living      Permission Sought/Granted Permission sought to  share information with : Case Manager Permission granted to share information with : Yes, Verbal Permission Granted  Share Information with NAME: Fannie Knee, RN           Emotional Assessment Appearance:: Appears stated age Attitude/Demeanor/Rapport: Gracious Affect (typically observed): Accepting   Alcohol / Substance Use: Not Applicable Psych Involvement: No (comment)  Admission diagnosis:  Acute encephalopathy [G93.40] Altered mental status, unspecified altered mental status type [R41.82] Patient Active Problem List   Diagnosis Date Noted   Acute encephalopathy 06/12/2022   Systolic murmur 06/12/2022   Iron deficiency anemia 05/16/2022   History of eye surgery 05/01/2022   Obesity, Class III, BMI 40-49.9 (morbid obesity) (HCC) 05/01/2022   Hypotension 04/30/2022   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 [place] - [address] AT [facility] [address] [place] [address] Phone: [phone] Fax: [phone]  [place] [facility] [address] [place] [address] Phone: [phone] Fax: [phone]     Social Determinants of Health (SDOH) Social History: SDOH Screenings   Food Insecurity: No Food Insecurity (05/01/2022)  Housing: Low  Risk  (05/01/2022)  Transportation Needs: No Transportation Needs (05/21/2022)  Utilities: Not At Risk (05/21/2022)  Tobacco Use: Medium Risk (06/12/2022)   SDOH Interventions:     Readmission Risk Interventions    05/18/2022   10:24 AM 05/06/2022    3:39 PM  Readmission Risk Prevention Plan  Transportation Screening Complete Complete  PCP or Specialist Appt within 5-7 Days Complete Complete  Home Care Screening  Complete Complete  Medication Review (RN CM) Complete Complete

## 2022-06-13 NOTE — Progress Notes (Signed)
   06/13/22 0233  Assess: MEWS Score  Temp (!) 100.9 F (38.3 C)  BP (!) 104/55  MAP (mmHg) 70  Pulse Rate (!) 109  Resp 20  SpO2 97 %  O2 Device Room Air  Assess: MEWS Score  MEWS Temp 1  MEWS Systolic 0  MEWS Pulse 1  MEWS RR 0  MEWS LOC 1  MEWS Score 3  MEWS Score Color Yellow  Assess: if the MEWS score is Yellow or Red  Were vital signs taken at a resting state? Yes  Focused Assessment Change from prior assessment (see assessment flowsheet)  Does the patient meet 2 or more of the SIRS criteria? No  MEWS guidelines implemented  Yes, yellow  Treat  MEWS Interventions Considered administering scheduled or prn medications/treatments as ordered  Take Vital Signs  Increase Vital Sign Frequency  Yellow: Q2hr x1, continue Q4hrs until patient remains green for 12hrs  Escalate  MEWS: Escalate Yellow: Discuss with charge nurse and consider notifying provider and/or RRT  Notify: Charge Nurse/RN  Name of Charge Nurse/RN Notified Jake RN  Assess: SIRS CRITERIA  SIRS Temperature  0  SIRS Pulse 1  SIRS Respirations  0  SIRS WBC 0  SIRS Score Sum  1

## 2022-06-14 ENCOUNTER — Inpatient Hospital Stay (HOSPITAL_COMMUNITY)
Admit: 2022-06-14 | Discharge: 2022-06-14 | Disposition: A | Discharge status: Home / Self Care | Payer: Medicare (Managed Care) | Attending: Internal Medicine | Admitting: Internal Medicine

## 2022-06-14 DIAGNOSIS — G9341 Metabolic encephalopathy: Secondary | ICD-10-CM | POA: Diagnosis not present

## 2022-06-14 DIAGNOSIS — K7469 Other cirrhosis of liver: Secondary | ICD-10-CM

## 2022-06-14 DIAGNOSIS — E038 Other specified hypothyroidism: Secondary | ICD-10-CM

## 2022-06-14 DIAGNOSIS — Z9889 Other specified postprocedural states: Secondary | ICD-10-CM | POA: Diagnosis not present

## 2022-06-14 DIAGNOSIS — D508 Other iron deficiency anemias: Secondary | ICD-10-CM

## 2022-06-14 DIAGNOSIS — E538 Deficiency of other specified B group vitamins: Secondary | ICD-10-CM | POA: Diagnosis present

## 2022-06-14 DIAGNOSIS — K219 Gastro-esophageal reflux disease without esophagitis: Secondary | ICD-10-CM | POA: Diagnosis not present

## 2022-06-14 DIAGNOSIS — B9620 Unspecified Escherichia coli [E. coli] as the cause of diseases classified elsewhere: Secondary | ICD-10-CM | POA: Diagnosis present

## 2022-06-14 LAB — COMPREHENSIVE METABOLIC PANEL
ALT: 14 U/L (ref 0–44)
AST: 25 U/L (ref 15–41)
Albumin: 1.8 g/dL — ABNORMAL LOW (ref 3.5–5.0)
Alkaline Phosphatase: 98 U/L (ref 38–126)
Anion gap: 6 (ref 5–15)
BUN: 28 mg/dL — ABNORMAL HIGH (ref 8–23)
CO2: 19 mmol/L — ABNORMAL LOW (ref 22–32)
Calcium: 7.6 mg/dL — ABNORMAL LOW (ref 8.9–10.3)
Chloride: 110 mmol/L (ref 98–111)
Creatinine, Ser: 1.09 mg/dL — ABNORMAL HIGH (ref 0.44–1.00)
GFR, Estimated: 57 mL/min — ABNORMAL LOW (ref >60)
Glucose, Bld: 116 mg/dL — ABNORMAL HIGH (ref 70–99)
Potassium: 3.6 mmol/L (ref 3.5–5.1)
Sodium: 135 mmol/L (ref 135–145)
Total Bilirubin: 1.8 mg/dL — ABNORMAL HIGH (ref 0.3–1.2)
Total Protein: 4.6 g/dL — ABNORMAL LOW (ref 6.5–8.1)

## 2022-06-14 LAB — C-REACTIVE PROTEIN: CRP: 9.6 mg/dL — ABNORMAL HIGH (ref <1.0)

## 2022-06-14 LAB — CBC WITH DIFFERENTIAL/PLATELET
Abs Immature Granulocytes: 0.03 10*3/uL (ref 0.00–0.07)
Basophils Absolute: 0.1 10*3/uL (ref 0.0–0.1)
Basophils Relative: 1 %
Eosinophils Absolute: 0.3 10*3/uL (ref 0.0–0.5)
Eosinophils Relative: 3 %
HCT: 27.6 % — ABNORMAL LOW (ref 36.0–46.0)
Hemoglobin: 8.5 g/dL — ABNORMAL LOW (ref 12.0–15.0)
Immature Granulocytes: 0 %
Lymphocytes Relative: 12 %
Lymphs Abs: 1.2 10*3/uL (ref 0.7–4.0)
MCH: 29.6 pg (ref 26.0–34.0)
MCHC: 30.8 g/dL (ref 30.0–36.0)
MCV: 96.2 fL (ref 80.0–100.0)
Monocytes Absolute: 1 10*3/uL (ref 0.1–1.0)
Monocytes Relative: 10 %
Neutro Abs: 7 10*3/uL (ref 1.7–7.7)
Neutrophils Relative %: 74 %
Platelets: 127 10*3/uL — ABNORMAL LOW (ref 150–400)
RBC: 2.87 MIL/uL — ABNORMAL LOW (ref 3.87–5.11)
RDW: 17.6 % — ABNORMAL HIGH (ref 11.5–15.5)
WBC: 9.6 10*3/uL (ref 4.0–10.5)
nRBC: 0 % (ref 0.0–0.2)

## 2022-06-14 LAB — PROTIME-INR
INR: 3.2 — ABNORMAL HIGH (ref 0.8–1.2)
Prothrombin Time: 33.1 seconds — ABNORMAL HIGH (ref 11.4–15.2)

## 2022-06-14 LAB — MAGNESIUM: Magnesium: 1.6 mg/dL — ABNORMAL LOW (ref 1.7–2.4)

## 2022-06-14 LAB — URINE CULTURE: Culture: 70000 — AB

## 2022-06-14 LAB — APTT: aPTT: 53 seconds — ABNORMAL HIGH (ref 24–36)

## 2022-06-14 LAB — AMMONIA: Ammonia: 61 umol/L — ABNORMAL HIGH (ref 9–35)

## 2022-06-14 MED ORDER — PANTOPRAZOLE SODIUM 40 MG IV SOLR
40.0000 mg | INTRAVENOUS | Status: DC
  Administered 2022-06-14 – 2022-06-15 (×2): 40 mg via INTRAVENOUS
  Filled 2022-06-14 (×2): qty 10

## 2022-06-14 MED ORDER — MAGNESIUM SULFATE 2 GM/50ML IV SOLN
2.0000 g | Freq: Once | INTRAVENOUS | Status: AC
  Administered 2022-06-14: 2 g via INTRAVENOUS
  Filled 2022-06-14: qty 50

## 2022-06-14 NOTE — Progress Notes (Signed)
EEG complete - results pending 

## 2022-06-14 NOTE — Evaluation (Signed)
Physical Therapy Evaluation Patient Details Name: [name] MRN: [id number] DOB: 01/10/1956 Today's Date: 06/14/2022  History of Present Illness  Pt is 67 yo female admitted on 06/12/22 with acute metabolic encephalopathy with UTI most likely source.  Pt with other hx including cirrhosis due to NASH,, prior pulmonary embolism, hypertension, chronic diarrhea due to short gut syndrome, iron deficiency anemia, gastroesophageal reflux disease  Clinical Impression  Pt admitted with above diagnosis.  Pt with recent hospitalizations - her recent baseline was household/short community ambulation with RW or rollator, supervision with adls, and working with HHPT.  Pt lives her son who is very supportive and they have necessary DME.  Today, pt tolerated therapy well.  She was supervision to min guard level and ambulated 60' with RW with all VSS.  Does have balance deficits likely effected by vision and weakness - but was steady with RW.  Pt will benefit from acute PT services but expected to progress well and be able to return home with her son at d/c and to continue HHPT.  Pt currently with functional limitations due to the deficits listed below (see PT Problem List). Pt will benefit from acute skilled PT to increase their independence and safety with mobility to allow discharge.          Recommendations for follow up therapy are one component of a multi-disciplinary discharge planning process, led by the attending physician.  Recommendations may be updated based on patient status, additional functional criteria and insurance authorization.  Follow Up Recommendations       Assistance Recommended at Discharge Intermittent Supervision/Assistance  Patient can return home with the following  A little help with walking and/or transfers;A little help with bathing/dressing/bathroom;Assistance with cooking/housework;Assist for transportation;Help with stairs or ramp for entrance    Equipment Recommendations  None recommended by PT  Recommendations for Other Services       Functional Status Assessment Patient has had a recent decline in their functional status and demonstrates the ability to make significant improvements in function in a reasonable and predictable amount of time.     Precautions / Restrictions Precautions Precautions: Fall Precaution Comments: denies falls in past 6 months      Mobility  Bed Mobility Overal bed mobility: Needs Assistance Bed Mobility: Supine to Sit     Supine to sit: Supervision, HOB elevated     General bed mobility comments: Use of rail    Transfers Overall transfer level: Needs assistance Equipment used: Rolling walker (2 wheels) Transfers: Sit to/from Stand Sit to Stand: Min guard           General transfer comment: cues for hand palcement    Ambulation/Gait Ambulation/Gait assistance: Min guard Gait Distance (Feet): 60 Feet Assistive device: Rolling walker (2 wheels) Gait Pattern/deviations: Step-through pattern, Decreased stride length Gait velocity: decreased     General Gait Details: Pt starting with slow small steps with improvement throughout walk; min guard for safety; min cues for RW proximity  Stairs            Wheelchair Mobility    Modified Rankin (Stroke Patients Only)       Balance Overall balance assessment: Needs assistance Sitting-balance support: No upper extremity supported, Feet supported Sitting balance-Leahy Scale: Good     Standing balance support: Bilateral upper extremity supported, Reliant on assistive device for balance Standing balance-Leahy Scale: Poor Standing balance comment: Steady wtih RW  Pertinent Vitals/Pain Pain Assessment Pain Assessment: No/denies pain    Home Living Family/patient expects to be discharged to:: Private residence Living Arrangements: Children Available Help at Discharge: Family;Available 24 hours/day Type of  Home: House Home Access: Ramped entrance       Home Layout: One level Home Equipment: Rollator (4 wheels);Rolling Walker (2 wheels);BSC/3in1;Other (comment);Hospital bed;Wheelchair - manual Additional Comments: Pt lives with son [name] - very supportive, provides assist as needed, takes pt out in community, provides mental challenges to pt to keep her sharp and monitor status    Prior Function Prior Level of Function : Independent/Modified Independent             Mobility Comments: Prior to recent hospitalizations was using rollator in community and no AD in home; no falls past 6 months; since recent hospitalizations she has been using a RW in home and short community distances, if longer distance used w/c. She was getting HHPT and working on progressing to LRAD ADLs Comments: Pt reports that she can normally do her ADLs on her own. Son assists with IADL and occasionally socks/shoes     Hand Dominance   Dominant Hand: Right    Extremity/Trunk Assessment   Upper Extremity Assessment Upper Extremity Assessment: Overall WFL for tasks assessed    Lower Extremity Assessment Lower Extremity Assessment: LLE deficits/detail;RLE deficits/detail RLE Deficits / Details: ROM WFL; MMT 4+/5 LLE Deficits / Details: ROM WFL; MMT 4+/5    Cervical / Trunk Assessment Cervical / Trunk Assessment: Normal  Communication   Communication: No difficulties  Cognition Arousal/Alertness: Awake/alert Behavior During Therapy: WFL for tasks assessed/performed Overall Cognitive Status: Impaired/Different from baseline Area of Impairment: Orientation                 Orientation Level: Disoriented to, Time             General Comments: Pt recalling year and stating month as July (it is June); she was able to recall the 5 words that her son has her recall at home but with increased time; followed basic commands        General Comments General comments (skin integrity, edema, etc.): VSS     Exercises     Assessment/Plan    PT Assessment Patient needs continued PT services  PT Problem List Decreased activity tolerance;Decreased mobility;Decreased strength;Cardiopulmonary status limiting activity;Decreased balance;Decreased knowledge of use of DME;Decreased cognition       PT Treatment Interventions Gait training;Therapeutic activities;Balance training;Functional mobility training;Therapeutic exercise;DME instruction;Modalities;Patient/family education    PT Goals (Current goals can be found in the Care Plan section)  Acute Rehab PT Goals Patient Stated Goal: Return home PT Goal Formulation: With patient/family Time For Goal Achievement: 06/28/22 Potential to Achieve Goals: Good    Frequency Min 1X/week     Co-evaluation               AM-PAC PT "6 Clicks" Mobility  Outcome Measure Help needed turning from your back to your side while in a flat bed without using bedrails?: None Help needed moving from lying on your back to sitting on the side of a flat bed without using bedrails?: A Little Help needed moving to and from a bed to a chair (including a wheelchair)?: A Little Help needed standing up from a chair using your arms (e.g., wheelchair or bedside chair)?: A Little Help needed to walk in hospital room?: A Little Help needed climbing 3-5 steps with a railing? : A Lot 6 Click Score: 18  End of Session Equipment Utilized During Treatment: Gait belt Activity Tolerance: Patient tolerated treatment well Patient left: with call bell/phone within reach;with family/visitor present;in chair;with chair alarm set Nurse Communication: Mobility status PT Visit Diagnosis: Difficulty in walking, not elsewhere classified (R26.2)    Time: 1610-9604 PT Time Calculation (min) (ACUTE ONLY): 34 min   Charges:   PT Evaluation $PT Eval Low Complexity: 1 Low PT Treatments $Gait Training: 8-22 mins        Anise Salvo, PT Acute Rehab [facility]  [phone]   Rayetta Humphrey 06/14/2022, 3:54 PM

## 2022-06-14 NOTE — Assessment & Plan Note (Signed)
Patient identified to have substantial folate deficiency Likely contributing to patient's anemia Initiating supplementation

## 2022-06-14 NOTE — Assessment & Plan Note (Signed)
Thus far, urinary tract infection is felt to be the most likely etiology of the patient's encephalopathy  Clinically improving Urine culture growing out Klebsiella and not multidrug-resistant E. coli, will de-escalate antibiotics CT imaging of the head unremarkable  TSH, vitamin B12 unremarkable Ammonia, while slightly elevated at 53 is near patient's baseline. EEG unremarkable

## 2022-06-14 NOTE — Progress Notes (Signed)
PROGRESS NOTE   [name]  ZOX:[id number] DOB: 1955/10/04 DOA: 06/12/2022 PCP: [name], MD   Date of Service: the patient was seen and examined on 06/13/2022  Brief Narrative:  67 year old female with past medical history of cirrhosis due to NASH,, prior pulmonary embolism, hypertension, chronic diarrhea due to short gut syndrome, iron deficiency anemia, gastroesophageal reflux disease who presented to [facility] emergency department with increasing weakness and confusion.  Of note, patient was recently hospitalized from 5/6 until 5/12 for sepsis felt to be secondary to a combination of multidrug-resistant E. coli urinary tract infection sensitive to cefepime on urine culture sensitive to cefepime and right breast mastitis.  Patient was discharged after 7 days of antibiotics with improving mentation.  Upon repeat evaluation in the emergency department on this presentation patient is developed recurrent acute metabolic encephalopathy.  Patient is been placed on empiric antibiotics with cefepime as well as lactulose due to somewhat elevated ammonia in the hospital group has been called to assess the patient for admission to the hospital.   Assessment and Plan: * Acute metabolic encephalopathy Thus far, urinary tract infection is felt to be the most likely etiology of the patient's encephalopathy  CT imaging of the head unremarkable  TSH, vitamin B12 unremarkable Ammonia, while slightly elevated at 53 is near patient's baseline. Obtaining EEG Treating with empiric antibiotic therapy for suspected recurrent urinary tract infection with intravenous cefepime considering history of MDR E. Coli Monitoring for symptomatic improvement  E-coli UTI Urinalysis somewhat suggestive of infection Urine culture pending Treating with cefepime in the meantime based on previous culture sensitivities for E. Coli   GERD (gastroesophageal reflux disease) Continue intravenous PPI for  now  Cirrhosis due to NASH Supportive care  Increasing regimen of lactulose to maintain 2-3 loose stools daily for management of patient's hepatic encephalopathy  Continue rifaximin No evidence of bleeding complications or significant cirrhosis on exam  Hypothyroidism Continue synthroid daily  Iron deficiency anemia Multifactorial with iron deficiency and folate deficiency.   Hemoglobin stable No clinical evidence of bleeding Recent history of blood transfusion on 5/11   History of eye surgery Blind in left eye Continue drops   Folate deficiency Patient identified to have substantial folate deficiency Likely contributing to patient's anemia Initiating supplementation  Systolic murmur Check echo   Hypotension-resolved as of 06/14/2022 Midodrine PRN Parameters written     Subjective:  Patient unable to answer questions appropriately due to lethargy and confusion.  Physical Exam:  Vitals:   06/13/22 0448 06/13/22 1337 06/13/22 2044   BP: (!) 96/55 (!) 121/57 98/60   Pulse: (!) 110 83 89   Resp: 20 20 16    Temp: 98.4 F (36.9 C) (!) 97.3 F (36.3 C) 97.8 F (36.6 C)   TempSrc: Oral Oral Oral   SpO2: 98% 98% 95%   Weight:      Height:         Constitutional: Patient is urgent but arousable, no associated distress.   Skin: no rashes, no lesions, good skin turgor noted. Eyes: Pupils are equally reactive to light.  No evidence of scleral icterus or conjunctival pallor.  ENMT: Moist mucous membranes noted.  Posterior pharynx clear of any exudate or lesions.   Respiratory: Scattered rhonchi bilaterally with mild bibasilar rales.  Normal respiratory effort. No accessory muscle use.  Cardiovascular: Regular rate and rhythm, no murmurs / rubs / gallops. No extremity edema. 2+ pedal pulses. No carotid bruits.  Abdomen: Abdomen is soft and nontender.  No evidence of intra-abdominal masses.  Positive bowel sounds noted in all quadrants.   Musculoskeletal: No joint  deformity upper and lower extremities. Good ROM, no contractures. Normal muscle tone.    Data Reviewed:  I have personally reviewed and interpreted labs, imaging.  Significant findings are   CBC: Recent Labs  Lab 06/12/22 1900 06/13/22 0446 06/14/22 0505  WBC 13.0* 12.4* 9.6  NEUTROABS 10.9*  --  7.0  HGB 9.7* 9.3* 8.5*  HCT 30.7* 29.6* 27.6*  MCV 92.7 95.5 96.2  PLT 153 136* 127*    Basic Metabolic Panel: Recent Labs  Lab 06/12/22 1900 06/13/22 0446 06/14/22 0505  NA 133* 132* 135  K 4.1 4.3 3.6  CL 105 106 110  CO2 21* 19* 19*  GLUCOSE 113* 80 116*  BUN 23 22 28*  CREATININE 1.12* 1.06* 1.09*  CALCIUM 8.1* 7.8* 7.6*  MG  --   --  1.6*    GFR: Estimated Creatinine Clearance: 64.3 mL/min (A) (by C-G formula based on SCr of 1.09 mg/dL (H)). Liver Function Tests: Recent Labs  Lab 06/12/22 1900 06/13/22 0446 06/14/22 0505  AST 23 23 25   ALT 18 16 14   ALKPHOS 139* 112 98  BILITOT 2.1* 2.6* 1.8*  PROT 5.6* 4.8* 4.6*  ALBUMIN 2.3* 2.1* 1.8*     Coagulation Profile: Recent Labs  Lab 06/12/22 2303 06/14/22 0505  INR 2.0* 3.2*        Code Status:  Full code.  Code status decision has been confirmed with: son at the beside Family Communication: Son is at the bedside and has been updated on plan of care.   Severity of Illness:  The appropriate patient status for this patient is INPATIENT. Inpatient status is judged to be reasonable and necessary in order to provide the required intensity of service to ensure the patient's safety. The patient's presenting symptoms, physical exam findings, and initial radiographic and laboratory data in the context of their chronic comorbidities is felt to place them at high risk for further clinical deterioration. Furthermore, it is not anticipated that the patient will be medically stable for discharge from the hospital within 2 midnights of admission.   * I certify that at the point of admission it is my clinical  judgment that the patient will require inpatient hospital care spanning beyond 2 midnights from the point of admission due to high intensity of service, high risk for further deterioration and high frequency of surveillance required.*  Time spent:  59 minutes  Author:  Marinda Elk MD  06/13/2022

## 2022-06-14 NOTE — Plan of Care (Signed)
  Problem: Fluid Volume: Goal: Hemodynamic stability will improve Outcome: Progressing   Problem: Respiratory: Goal: Ability to maintain adequate ventilation will improve Outcome: Progressing   Problem: Clinical Measurements: Goal: Respiratory complications will improve Outcome: Progressing

## 2022-06-14 NOTE — Assessment & Plan Note (Signed)
Urinalysis somewhat suggestive of infection Urine culture pending Treating with cefepime in the meantime based on previous culture sensitivities for E. Coli

## 2022-06-14 NOTE — Progress Notes (Signed)
Mobility Specialist - Progress Note   06/14/22 0936  Mobility  Activity  (Bed Level Exercises)  Range of Motion/Exercises Right arm;Left arm;Active  Activity Response Tolerated well  Mobility Referral Yes  $Mobility charge 1 Mobility  Mobility Specialist Start Time (ACUTE ONLY) [id number]  Mobility Specialist Stop Time (ACUTE ONLY) [id number]  Mobility Specialist Time Calculation (min) (ACUTE ONLY) 8 min   Pt received in bed and agreeable to do UE exercises. No complaints during session. See below for exercises pt was able to participate in. Pt to bed after session with all needs met.   Supine BUE Exercises: 8 reps each,  yellow resistance band  1) Elbow Flexion  2) Elbow extension   3) Horizontal Abduction    Chief Technology Officer

## 2022-06-15 DIAGNOSIS — R4182 Altered mental status, unspecified: Secondary | ICD-10-CM

## 2022-06-15 DIAGNOSIS — R569 Unspecified convulsions: Secondary | ICD-10-CM | POA: Diagnosis not present

## 2022-06-15 DIAGNOSIS — G9341 Metabolic encephalopathy: Secondary | ICD-10-CM | POA: Diagnosis not present

## 2022-06-15 MED ORDER — FOLIC ACID 1 MG PO TABS: 1.0000 mg | ORAL_TABLET | Freq: Every day | ORAL | 0 refills | Status: DC

## 2022-06-15 NOTE — Procedures (Signed)
Patient Name: [name]  MRN: [id number]  Epilepsy Attending: Charlsie Quest  Referring Physician/Provider: Marinda Elk, MD  Date: 06/14/2022 Duration: 23.07 mins  Patient history: 67yo F with ams getting eeg to evaluate for seizure  Level of alertness: Awake  AEDs during EEG study: None  Technical aspects: This EEG study was done with scalp electrodes positioned according to the 10-20 International system of electrode placement. Electrical activity was reviewed with band pass filter of 1-70Hz , sensitivity of 7 uV/mm, display speed of 43mm/sec with a 60Hz  notched filter applied as appropriate. EEG data were recorded continuously and digitally stored.  Video monitoring was available and reviewed as appropriate.  Description: The posterior dominant rhythm consists of 7.5 Hz activity of moderate voltage (25-35 uV) seen predominantly in posterior head regions, symmetric and reactive to eye opening and eye closing. EEG showed intermittent generalized 3 to 6 Hz theta-delta slowing. Hyperventilation and photic stimulation were not performed.     ABNORMALITY - Intermittent slow, generalized  IMPRESSION: This study is suggestive of mild diffuse encephalopathy, nonspecific etiology. No seizures or epileptiform discharges were seen throughout the recording.   [name]

## 2022-06-15 NOTE — Discharge Summary (Signed)
Physician Discharge Summary  [name] NFA:[id number] DOB: 09-Feb-1955 DOA: 06/12/2022  PCP: [name], MD  Admit date: 06/12/2022 Discharge date: 06/15/2022 Discharging to: home with son Recommendations for Outpatient Follow-up:  F/u on Creatinine  Consults:  none Procedures:  none         Hospital Course:  This is a 67 year old female with [name] cirrhosis, hypertension, short gut syndrome, iron deficiency anemia, gastroesophageal reflux disease and history of PE who presented to the hospital with lethargy.  The patient's son noted that she had not slept well the night before and he has been taking her out a lot during the da and feels that she might of been exhausted.  He also had noted that she was drinking a lot more fluid and stating that she was thirsty.  He states that she was having 2-3 bowel movements a day and did not think that she had hepatic encephalopathy. The patient was discharged from the hospital on 5/12 after spending 6 days and being treated for sepsis secondary to an E. coli UTI.  Apparently she was confused at that time as well.  She was noted to have AKI felt to be secondary to diuretics.  Spironolactone was reduced to 12.5 from 25 mg daily.  According to the H&P, the son had increased the dose of spironolactone and Lasix because she had not been urinating much. Her son also gives lactulose as needed based on how many bowel movements she has each day.  In the ED on 6/2: Afebrile, WBC count 13, ammonia 64 Very somnolent. UA revealed trace leukocytes, many bacteria, 25-50 WBCs Urine culture grew out Klebsiella-70,000 colonies. Creatinine was noted to be 1.12, increased from 0.81 on 05/22/2022. She was started on cefepime and lactulose.  CT of the head showed chronic microvascular disease  MRI of the thoracic and lumbar spine also performed- results noted below revealed mild herniation of T10-T11 into the spinal cord without any significant cord  compression  Principal Problem:   Acute metabolic encephalopathy? polypharmacy -Combination of dehydration, exhaustion and possibly a UTI  -Also noted is the fact that she has hydrocodone, Atarax, Robaxin, promethazine prescribed- doubling of her creatinine might have resulted in poor clearance of these medications -EEG reveals mild diffuse encephalopathy - TSH 2.787   Active Problems: AKI with dehydration - The patient's creatinine has improved to 1.09- baseline is ~ 0.8-0.9 -Will resume her prior doses of diuretics  Klebsiella UTI  (Mild) -She did grow 70,000 colonies of Klebsiella in her urine culture and therefore may have had a mild UTI - She has completed 3 days of antibiotics  Low folate level and normocytic anemia - MCV high normal at 96 - Folate noted to be 3.5 - Replacement has been ordered and it should be rechecked in a few months    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 Martyn Malay had ordered an echo which reveals an EF of 60 to 65% grade 1 diastolic dysfunction and mild LVH.  There is also mild mitral valve regurgitation and moderate calcification of the aortic valve with moderate aortic valve stenosis which may account for the murmur  Morbid obesity  Body mass index is 42.98 kg/m.            Discharge Instructions  Discharge Instructions     Diet - low sodium heart healthy   Complete by: As directed  Increase activity slowly   Complete by: As directed    No wound care   Complete by: As directed       Allergies as of 06/15/2022       Reactions   Ivp Dye [iodinated Contrast Media] Anaphylaxis   Ace Inhibitors Swelling   Chlorhexidine    Pt has a rash, & made irritation skin further   Doxycycline Rash   Penicillins Rash   Zofran [ondansetron] Rash        Medication List     STOP taking these  medications    HYDROcodone-acetaminophen 10-325 MG tablet Commonly known as: NORCO       TAKE these medications    acetaminophen 325 MG tablet Commonly known as: TYLENOL Take 650 mg by mouth every 6 (six) hours as needed for moderate pain or headache. Patient used this medication for her headache.   allopurinol 100 MG tablet Commonly known as: ZYLOPRIM Take 100 mg by mouth daily.   atorvastatin 40 MG tablet Commonly known as: LIPITOR Take 40 mg by mouth in the morning.   colchicine 0.6 MG tablet Take 0.6 mg by mouth daily as needed (gout).   Constulose 10 GM/15ML solution Generic drug: lactulose Take 15 mLs (10 g total) by mouth 2 (two) times daily. What changed:  when to take this reasons to take this   cyanocobalamin 1000 MCG tablet Commonly known as: VITAMIN B12 Take 2,000 mcg by mouth daily.   EPINEPHrine 0.3 mg/0.3 mL Soaj injection Commonly known as: EPI-PEN Inject 0.3 mg into the muscle as needed for anaphylaxis.   famotidine 20 MG tablet Commonly known as: PEPCID Take 1 tablet by mouth daily.   folic acid 1 MG tablet Commonly known as: FOLVITE Take 1 tablet (1 mg total) by mouth daily. Start taking on: June 16, 2022   furosemide 40 MG tablet Commonly known as: LASIX Take 1 tablet (40 mg total) by mouth daily.   hydrOXYzine 25 MG tablet Commonly known as: ATARAX Take 25 mg by mouth 3 (three) times daily as needed for itching.   IRON-VITAMIN C PO Take 1 tablet by mouth daily.   levothyroxine 150 MCG tablet Commonly known as: SYNTHROID Take 150 mcg by mouth daily before breakfast.   magnesium gluconate 500 MG tablet Commonly known as: MAGONATE Take 1,000 mg by mouth daily.   methocarbamol 500 MG tablet Commonly known as: ROBAXIN Take 500 mg by mouth daily.   metoprolol succinate 25 MG 24 hr tablet Commonly known as: TOPROL-XL Take 12.5 mg by mouth daily as needed (afib).   midodrine 5 MG tablet Commonly known as: PROAMATINE Take 5 mg  by mouth daily as needed (low blood pressure).   naloxone 4 MG/0.1ML Liqd nasal spray kit Commonly known as: NARCAN Place 1 spray into the nose as needed (accidental overdose).   neomycin-polymyxin b-dexamethasone 3.5-10000-0.1 Oint Commonly known as: MAXITROL Place 1 Application into the left eye 3 (three) times daily.   nystatin cream Commonly known as: MYCOSTATIN Apply topically 2 (two) times daily. What changed:  how much to take when to take this reasons to take this   pantoprazole 40 MG tablet Commonly known as: PROTONIX Take 1 tablet (40 mg total) by mouth daily.   promethazine 12.5 MG tablet Commonly known as: PHENERGAN Take 12.5 mg by mouth every 6 (six) hours as needed for nausea or vomiting.   rifaximin 550 MG Tabs tablet Commonly known as: XIFAXAN Take 550 mg by mouth 2 (two) times daily.  spironolactone 25 MG tablet Commonly known as: ALDACTONE Take 0.5 tablets (12.5 mg total) by mouth daily. What changed: how much to take   triamcinolone cream 0.5 % Commonly known as: KENALOG Apply to affected area of right breast twice daily as directed. What changed:  how much to take when to take this reasons to take this   venlafaxine XR 75 MG 24 hr capsule Commonly known as: EFFEXOR-XR Take 75 mg by mouth daily with breakfast.   Vitamin D (Ergocalciferol) 1.25 MG (50000 UNIT) Caps capsule Commonly known as: DRISDOL Take 50,000 Units by mouth every 7 (seven) days.            The results of significant diagnostics from this hospitalization (including imaging, microbiology, ancillary and laboratory) are listed below for reference.    EEG adult  Result Date: 06/15/2022 Kara Quest, MD     06/15/2022  8:30 AM Patient Name: [name] MRN: [id number] Epilepsy Attending: Charlsie [name] Referring Physician/Provider: Marinda Elk, MD Date: 06/14/2022 Duration: 23.07 mins Patient history: 67yo F with ams getting eeg to evaluate for seizure Level of  alertness: Awake AEDs during EEG study: None Technical aspects: This EEG study was done with scalp electrodes positioned according to the 10-20 International system of electrode placement. Electrical activity was reviewed with band pass filter of 1-70Hz , sensitivity of 7 uV/mm, display speed of 29mm/sec with a 60Hz  notched filter applied as appropriate. EEG data were recorded continuously and digitally stored.  Video monitoring was available and reviewed as appropriate. Description: The posterior dominant rhythm consists of 7.5 Hz activity of moderate voltage (25-35 uV) seen predominantly in posterior head regions, symmetric and reactive to eye opening and eye closing. EEG showed intermittent generalized 3 to 6 Hz theta-delta slowing. Hyperventilation and photic stimulation were not performed.   ABNORMALITY - Intermittent slow, generalized IMPRESSION: This study is suggestive of mild diffuse encephalopathy, nonspecific etiology. No seizures or epileptiform discharges were seen throughout the recording. [name]   MR THORACIC SPINE W WO CONTRAST  Result Date: 06/13/2022 CLINICAL DATA:  Severe back EXAM: MRI THORACIC AND LUMBAR SPINE WITHOUT AND WITH CONTRAST TECHNIQUE: Multiplanar and multiecho pulse sequences of the thoracic and lumbar spine were obtained without and with intravenous contrast. CONTRAST:  10mL GADAVIST GADOBUTROL 1 MMOL/ML IV SOLN COMPARISON:  None Available. FINDINGS: MRI THORACIC SPINE FINDINGS Alignment:  Physiologic. Vertebrae: No fracture, evidence of discitis, or bone lesion. Cord:  Normal signal and morphology. Paraspinal and other soft tissues: Small left pleural effusion Disc levels: T6-7: Small central disc protrusion without stenosis. T7-8: Small disc bulge without stenosis. T8-9: Small disc bulge without stenosis. T9-10: Small disc bulge without stenosis. T10-11: Small central disc protrusion indenting the ventral spinal cord. No spinal canal stenosis. MRI LUMBAR SPINE FINDINGS  Segmentation:  Standard. Alignment:  Grade 1 retrolisthesis at L2-3 and L5-S1 Vertebrae:  Mild endplate edema at W0-9 without erosive change. Conus medullaris: Extends to the L1 level and appears normal. Paraspinal and other soft tissues: Negative Disc levels: L1-L2: Disc space narrowing with small bulge. No spinal canal stenosis. No neural foraminal stenosis. L2-L3: Intermediate sized disc bulge. Moderate spinal canal stenosis. No neural foraminal stenosis. L3-L4: Mild facet hypertrophy with intermediate sized disc bulge. Moderate spinal canal stenosis. No neural foraminal stenosis. L4-L5: Mild facet hypertrophy and small disc bulge. Mild spinal canal stenosis. Mild right neural foraminal stenosis. L5-S1: Left asymmetric small disc bulge. Left lateral recess narrowing without central spinal canal stenosis. No neural foraminal stenosis. Visualized sacrum:  Normal. IMPRESSION: 1. Mild endplate edema at Z6-1, likely degenerative. No discitis-osteomyelitis. 2. Small central disc protrusion at T10-11 indenting the ventral spinal cord. No spinal canal stenosis. 3. Moderate spinal canal stenosis at L2-3 and L3-4. 4. Small left pleural effusion. Electronically Signed   By: Deatra Robinson M.D.   On: 06/13/2022 23:42   MR Lumbar Spine W Wo Contrast  Result Date: 06/13/2022 CLINICAL DATA:  Severe back EXAM: MRI THORACIC AND LUMBAR SPINE WITHOUT AND WITH CONTRAST TECHNIQUE: Multiplanar and multiecho pulse sequences of the thoracic and lumbar spine were obtained without and with intravenous contrast. CONTRAST:  10mL GADAVIST GADOBUTROL 1 MMOL/ML IV SOLN COMPARISON:  None Available. FINDINGS: MRI THORACIC SPINE FINDINGS Alignment:  Physiologic. Vertebrae: No fracture, evidence of discitis, or bone lesion. Cord:  Normal signal and morphology. Paraspinal and other soft tissues: Small left pleural effusion Disc levels: T6-7: Small central disc protrusion without stenosis. T7-8: Small disc bulge without stenosis. T8-9: Small disc  bulge without stenosis. T9-10: Small disc bulge without stenosis. T10-11: Small central disc protrusion indenting the ventral spinal cord. No spinal canal stenosis. MRI LUMBAR SPINE FINDINGS Segmentation:  Standard. Alignment:  Grade 1 retrolisthesis at L2-3 and L5-S1 Vertebrae:  Mild endplate edema at W9-6 without erosive change. Conus medullaris: Extends to the L1 level and appears normal. Paraspinal and other soft tissues: Negative Disc levels: L1-L2: Disc space narrowing with small bulge. No spinal canal stenosis. No neural foraminal stenosis. L2-L3: Intermediate sized disc bulge. Moderate spinal canal stenosis. No neural foraminal stenosis. L3-L4: Mild facet hypertrophy with intermediate sized disc bulge. Moderate spinal canal stenosis. No neural foraminal stenosis. L4-L5: Mild facet hypertrophy and small disc bulge. Mild spinal canal stenosis. Mild right neural foraminal stenosis. L5-S1: Left asymmetric small disc bulge. Left lateral recess narrowing without central spinal canal stenosis. No neural foraminal stenosis. Visualized sacrum: Normal. IMPRESSION: 1. Mild endplate edema at E4-5, likely degenerative. No discitis-osteomyelitis. 2. Small central disc protrusion at T10-11 indenting the ventral spinal cord. No spinal canal stenosis. 3. Moderate spinal canal stenosis at L2-3 and L3-4. 4. Small left pleural effusion. Electronically Signed   By: Deatra Robinson M.D.   On: 06/13/2022 23:42   ECHOCARDIOGRAM COMPLETE  Result Date: 06/13/2022    ECHOCARDIOGRAM REPORT   Patient Name:   [name] Date of Exam: 06/13/2022 Medical Rec #:  [id number]      Height:       62.0 in Accession #:    [id number]     Weight:       264.6 lb Date of Birth:  07-22-1955       BSA:          2.153 m Patient Age:    66 years       BP:           96/55 mmHg Patient Gender: F              HR:           101 bpm. Exam Location:  Inpatient Procedure: 2D Echo, Cardiac Doppler and Color Doppler REPORT CONTAINS CRITICAL RESULT Indications:     Murmur  History:        Patient has prior history of Echocardiogram examinations, most                 recent 12/01/2020. Cirrhosis, Signs/Symptoms:Edema; Risk                 Factors:Former Smoker, Hypertension and Hx of PE.  Sonographer:  Wallie Char Referring Phys: [id number] Orland Mustard  Sonographer Comments: Patient is obese. Image acquisition challenging due to patient body habitus and Image acquisition challenging due to respiratory motion. Nira Conn MD @ 9:18am IMPRESSIONS  1. Left ventricular ejection fraction, by estimation, is 60 to 65%. The left ventricle has normal function. The left ventricle has no regional wall motion abnormalities. There is mild asymmetric left ventricular hypertrophy of the basal-septal segment. Left ventricular diastolic parameters are consistent with Grade I diastolic dysfunction (impaired relaxation).  2. Right ventricular systolic function is normal. The right ventricular size is normal.  3. The mitral valve is normal in structure. Trivial mitral valve regurgitation. No evidence of mitral stenosis.  4. The aortic valve is tricuspid. There is moderate calcification of the aortic valve. Aortic valve regurgitation is not visualized. Moderate aortic valve stenosis. FINDINGS  Left Ventricle: Left ventricular ejection fraction, by estimation, is 60 to 65%. The left ventricle has normal function. The left ventricle has no regional wall motion abnormalities. The left ventricular internal cavity size was normal in size. There is  mild asymmetric left ventricular hypertrophy of the basal-septal segment. Left ventricular diastolic parameters are consistent with Grade I diastolic dysfunction (impaired relaxation). Right Ventricle: The right ventricular size is normal. No increase in right ventricular wall thickness. Right ventricular systolic function is normal. Left Atrium: Left atrial size was normal in size. Right Atrium: Right atrial size was normal in size. Pericardium:  There is no evidence of pericardial effusion. Mitral Valve: The mitral valve is normal in structure. Trivial mitral valve regurgitation. No evidence of mitral valve stenosis. MV peak gradient, 8.8 mmHg. The mean mitral valve gradient is 4.0 mmHg. Tricuspid Valve: The tricuspid valve is normal in structure. Tricuspid valve regurgitation is trivial. No evidence of tricuspid stenosis. Aortic Valve: The aortic valve is tricuspid. There is moderate calcification of the aortic valve. Aortic valve regurgitation is not visualized. Moderate aortic stenosis is present. Aortic valve mean gradient measures 22.2 mmHg. Aortic valve peak gradient  measures 37.1 mmHg. Aortic valve area, by VTI measures 1.28 cm. Pulmonic Valve: The pulmonic valve was normal in structure. Pulmonic valve regurgitation is not visualized. No evidence of pulmonic stenosis. Aorta: The aortic root is normal in size and structure. Venous: The inferior vena cava was not well visualized. IAS/Shunts: No atrial level shunt detected by color flow Doppler.  LEFT VENTRICLE PLAX 2D LVIDd:         4.30 cm      Diastology LVIDs:         3.20 cm      LV e' medial:    6.27 cm/s LV PW:         1.00 cm      LV E/e' medial:  15.4 LV IVS:        1.20 cm      LV e' lateral:   7.43 cm/s LVOT diam:     1.70 cm      LV E/e' lateral: 13.0 LV SV:         79 LV SV Index:   37 LVOT Area:     2.27 cm  LV Volumes (MOD) LV vol d, MOD A2C: 92.5 ml LV vol d, MOD A4C: 109.0 ml LV vol s, MOD A2C: 38.8 ml LV vol s, MOD A4C: 46.0 ml LV SV MOD A2C:     53.7 ml LV SV MOD A4C:     109.0 ml LV SV MOD BP:      58.4 ml  RIGHT VENTRICLE             IVC RV Basal diam:  4.40 cm     IVC diam: 1.60 cm RV S prime:     19.30 cm/s TAPSE (M-mode): 2.5 cm LEFT ATRIUM             Index        RIGHT ATRIUM           Index LA diam:        3.60 cm 1.67 cm/m   RA Area:     20.30 cm LA Vol (A2C):   62.4 ml 28.98 ml/m  RA Volume:   56.10 ml  26.06 ml/m LA Vol (A4C):   63.0 ml 29.26 ml/m LA Biplane Vol:  62.8 ml 29.17 ml/m  AORTIC VALVE AV Area (Vmax):    1.23 cm AV Area (Vmean):   1.28 cm AV Area (VTI):     1.28 cm AV Vmax:           304.40 cm/s AV Vmean:          220.200 cm/s AV VTI:            0.616 m AV Peak Grad:      37.1 mmHg AV Mean Grad:      22.2 mmHg LVOT Vmax:         165.00 cm/s LVOT Vmean:        124.333 cm/s LVOT VTI:          0.348 m LVOT/AV VTI ratio: 0.57  AORTA Ao Root diam: 3.10 cm Ao Asc diam:  3.30 cm MITRAL VALVE MV Area (PHT): 4.79 cm     SHUNTS MV Area VTI:   2.71 cm     Systemic VTI:  0.35 m MV Peak grad:  8.8 mmHg     Systemic Diam: 1.70 cm MV Mean grad:  4.0 mmHg MV Vmax:       1.48 m/s MV Vmean:      94.8 cm/s MV Decel Time: 159 msec MV E velocity: 96.50 cm/s MV A velocity: 125.00 cm/s MV E/A ratio:  0.77 Arvilla Meres MD Electronically signed by Arvilla Meres MD Signature Date/Time: 06/13/2022/11:10:49 AM    Final    CT HEAD WO CONTRAST ( )  Result Date: 06/12/2022 CLINICAL DATA:  Altered mental status EXAM: CT HEAD WITHOUT CONTRAST TECHNIQUE: Contiguous axial images were obtained from the base of the skull through the vertex without intravenous contrast. RADIATION DOSE REDUCTION: This exam was performed according to the departmental dose-optimization program which includes automated exposure control, adjustment of the mA and/or kV according to patient size and/or use of iterative reconstruction technique. COMPARISON:  Head CT 05/16/2022 FINDINGS: Brain: No evidence of acute infarction, hemorrhage, hydrocephalus, extra-axial collection or mass lesion/mass effect. There stable mild patchy periventricular and deep white matter hypodensity. Vascular: Atherosclerotic calcifications are present within the cavernous internal carotid arteries. Skull: Normal. Negative for fracture or focal lesion. Sinuses/Orbits: No acute finding. Other: None. IMPRESSION: 1. No acute intracranial abnormality. 2. Stable mild patchy periventricular and deep white matter hypodensity, likely chronic  microvascular disease. Electronically Signed   By: Darliss Cheney M.D.   On: 06/12/2022 21:38   DG Chest Portable 1 View  Result Date: 06/12/2022 CLINICAL DATA:  Concern for pneumonia EXAM: PORTABLE CHEST 1 VIEW COMPARISON:  Chest x-ray dated May 16, 2018 FINDINGS: Cardiac and mediastinal contours within normal limits. Linear opacities of the left hemithorax, unchanged when compared with the prior exam and likely  due to scarring or atelectasis. Lungs are otherwise clear. No evidence of pleural effusion or pneumothorax. IMPRESSION: Linear opacities of the left hemithorax, unchanged when compared with the prior exam and likely due to scarring or atelectasis. No new airspace opacity. Electronically Signed   By: Allegra Lai M.D.   On: 06/12/2022 18:32   DG Chest Portable 1 View  Result Date: 05/16/2022 CLINICAL DATA:  Shortness of breath and altered mental status. EXAM: PORTABLE CHEST 1 VIEW COMPARISON:  April 30, 2022 FINDINGS: The cardiac silhouette is mildly enlarged and unchanged in size. There is marked severity calcification of the aortic arch. Mild, diffuse, chronic appearing increased lung markings are seen with mild left basilar atelectasis and/or infiltrate. There is no evidence of a pleural effusion or pneumothorax. The visualized skeletal structures are unremarkable. IMPRESSION: Chronic appearing increased lung markings with mild left basilar atelectasis and/or infiltrate. Electronically Signed   By: Aram Candela M.D.   On: 05/16/2022 20:41   CT HEAD WO CONTRAST  Result Date: 05/16/2022 CLINICAL DATA:  Mental status change, unknown cause EXAM: CT HEAD WITHOUT CONTRAST TECHNIQUE: Contiguous axial images were obtained from the base of the skull through the vertex without intravenous contrast. RADIATION DOSE REDUCTION: This exam was performed according to the departmental dose-optimization program which includes automated exposure control, adjustment of the mA and/or kV according to patient  size and/or use of iterative reconstruction technique. COMPARISON:  Head CT 01/19/2019, report from head CT 10/07/2021, images unavailable FINDINGS: Brain: No intracranial hemorrhage, mass effect, or midline shift. No hydrocephalus. The basilar cisterns are patent. Normal for age atrophy with similar chronic small vessel ischemia. No evidence of territorial infarct or acute ischemia. No extra-axial or intracranial fluid collection. Vascular: Atherosclerosis of skullbase vasculature without hyperdense vessel or abnormal calcification. Skull: No fracture or focal lesion. Sinuses/Orbits: Paranasal sinuses and mastoid air cells are clear. The visualized orbits are unremarkable. Other: None. IMPRESSION: 1. No acute intracranial abnormality. 2. Normal for age atrophy with chronic small vessel ischemia. Electronically Signed   By: Narda Rutherford M.D.   On: 05/16/2022 20:03   Labs:   Basic Metabolic Panel: Recent Labs  Lab 06/12/22 1900 06/13/22 0446 06/14/22 0505  NA 133* 132* 135  K 4.1 4.3 3.6  CL 105 106 110  CO2 21* 19* 19*  GLUCOSE 113* 80 116*  BUN 23 22 28*  CREATININE 1.12* 1.06* 1.09*  CALCIUM 8.1* 7.8* 7.6*  MG  --   --  1.6*     CBC: Recent Labs  Lab 06/12/22 1900 06/13/22 0446 06/14/22 0505  WBC 13.0* 12.4* 9.6  NEUTROABS 10.9*  --  7.0  HGB 9.7* 9.3* 8.5*  HCT 30.7* 29.6* 27.6*  MCV 92.7 95.5 96.2  PLT 153 136* 127*         SIGNED:   Calvert Cantor, MD  [facility] 06/15/2022, 12:53 PM

## 2022-06-15 NOTE — Plan of Care (Signed)
Problem: Fluid Volume: Goal: Hemodynamic stability will improve 06/15/2022 1304 by Angelique Holm, RN Outcome: Adequate for Discharge 06/15/2022 1304 by Angelique Holm, RN Outcome: Adequate for Discharge   Problem: Clinical Measurements: Goal: Diagnostic test results will improve 06/15/2022 1304 by Angelique Holm, RN Outcome: Adequate for Discharge 06/15/2022 1304 by Angelique Holm, RN Outcome: Adequate for Discharge Goal: Signs and symptoms of infection will decrease 06/15/2022 1304 by Angelique Holm, RN Outcome: Adequate for Discharge 06/15/2022 1304 by Angelique Holm, RN Outcome: Adequate for Discharge   Problem: Respiratory: Goal: Ability to maintain adequate ventilation will improve 06/15/2022 1304 by Angelique Holm, RN Outcome: Adequate for Discharge 06/15/2022 1304 by Angelique Holm, RN Outcome: Adequate for Discharge   Problem: Education: Goal: Knowledge of General Education information will improve Description: Including pain rating scale, medication(s)/side effects and non-pharmacologic comfort measures 06/15/2022 1304 by Angelique Holm, RN Outcome: Adequate for Discharge 06/15/2022 1304 by Angelique Holm, RN Outcome: Adequate for Discharge   Problem: Health Behavior/Discharge Planning: Goal: Ability to manage health-related needs will improve 06/15/2022 1304 by Angelique Holm, RN Outcome: Adequate for Discharge 06/15/2022 1304 by Angelique Holm, RN Outcome: Adequate for Discharge   Problem: Clinical Measurements: Goal: Ability to maintain clinical measurements within normal limits will improve 06/15/2022 1304 by Angelique Holm, RN Outcome: Adequate for Discharge 06/15/2022 1304 by Angelique Holm, RN Outcome: Adequate for Discharge Goal: Will remain free from infection 06/15/2022 1304 by Angelique Holm, RN Outcome: Adequate for Discharge 06/15/2022 1304 by Angelique Holm, RN Outcome: Adequate for Discharge Goal: Diagnostic test results will improve 06/15/2022 1304 by Angelique Holm,  RN Outcome: Adequate for Discharge 06/15/2022 1304 by Angelique Holm, RN Outcome: Adequate for Discharge Goal: Respiratory complications will improve 06/15/2022 1304 by Angelique Holm, RN Outcome: Adequate for Discharge 06/15/2022 1304 by Angelique Holm, RN Outcome: Adequate for Discharge Goal: Cardiovascular complication will be avoided 06/15/2022 1304 by Angelique Holm, RN Outcome: Adequate for Discharge 06/15/2022 1304 by Angelique Holm, RN Outcome: Adequate for Discharge   Problem: Activity: Goal: Risk for activity intolerance will decrease 06/15/2022 1304 by Angelique Holm, RN Outcome: Adequate for Discharge 06/15/2022 1304 by Angelique Holm, RN Outcome: Adequate for Discharge   Problem: Nutrition: Goal: Adequate nutrition will be maintained 06/15/2022 1304 by Angelique Holm, RN Outcome: Adequate for Discharge 06/15/2022 1304 by Angelique Holm, RN Outcome: Adequate for Discharge   Problem: Coping: Goal: Level of anxiety will decrease 06/15/2022 1304 by Angelique Holm, RN Outcome: Adequate for Discharge 06/15/2022 1304 by Angelique Holm, RN Outcome: Adequate for Discharge   Problem: Elimination: Goal: Will not experience complications related to bowel motility 06/15/2022 1304 by Angelique Holm, RN Outcome: Adequate for Discharge 06/15/2022 1304 by Angelique Holm, RN Outcome: Adequate for Discharge Goal: Will not experience complications related to urinary retention 06/15/2022 1304 by Angelique Holm, RN Outcome: Adequate for Discharge 06/15/2022 1304 by Angelique Holm, RN Outcome: Adequate for Discharge   Problem: Pain Managment: Goal: General experience of comfort will improve 06/15/2022 1304 by Angelique Holm, RN Outcome: Adequate for Discharge 06/15/2022 1304 by Angelique Holm, RN Outcome: Adequate for Discharge   Problem: Safety: Goal: Ability to remain free from injury will improve 06/15/2022 1304 by Angelique Holm, RN Outcome: Adequate for Discharge 06/15/2022 1304 by Angelique Holm,  RN Outcome: Adequate for Discharge   Problem: Skin Integrity: Goal: Risk for impaired  skin integrity will decrease 06/15/2022 1304 by Angelique Holm, RN Outcome: Adequate for Discharge 06/15/2022 1304 by Angelique Holm, RN Outcome: Adequate for Discharge

## 2022-06-15 NOTE — Progress Notes (Signed)
Patient's pharmacy verified with patient's son. Medication regimen reviewed with patient and son. GI follow-up instructions reviewed with patient's son.Patient's son agreed to call and reschedule his mom's appointment from this morning. Patient's PIV removed. Patient safely transported via wheelchair to main entrance for discharge.

## 2022-07-29 ENCOUNTER — Emergency Department (HOSPITAL_COMMUNITY): Payer: Medicare (Managed Care)

## 2022-07-29 ENCOUNTER — Emergency Department (HOSPITAL_COMMUNITY)
Admission: EM | Admit: 2022-07-29 | Discharge: 2022-07-29 | Disposition: A | Discharge status: Home / Self Care | Payer: Medicare (Managed Care) | Attending: Emergency Medicine | Admitting: Emergency Medicine

## 2022-07-29 ENCOUNTER — Other Ambulatory Visit: Payer: Self-pay

## 2022-07-29 ENCOUNTER — Encounter (HOSPITAL_COMMUNITY): Payer: Self-pay

## 2022-07-29 DIAGNOSIS — S29002A Unspecified injury of muscle and tendon of back wall of thorax, initial encounter: Secondary | ICD-10-CM | POA: Diagnosis present

## 2022-07-29 DIAGNOSIS — M549 Dorsalgia, unspecified: Secondary | ICD-10-CM

## 2022-07-29 DIAGNOSIS — X58XXXA Exposure to other specified factors, initial encounter: Secondary | ICD-10-CM | POA: Diagnosis not present

## 2022-07-29 DIAGNOSIS — S39012A Strain of muscle, fascia and tendon of lower back, initial encounter: Secondary | ICD-10-CM

## 2022-07-29 DIAGNOSIS — Z79899 Other long term (current) drug therapy: Secondary | ICD-10-CM | POA: Diagnosis not present

## 2022-07-29 DIAGNOSIS — R7989 Other specified abnormal findings of blood chemistry: Secondary | ICD-10-CM

## 2022-07-29 DIAGNOSIS — R197 Diarrhea, unspecified: Secondary | ICD-10-CM | POA: Insufficient documentation

## 2022-07-29 DIAGNOSIS — I10 Essential (primary) hypertension: Secondary | ICD-10-CM | POA: Diagnosis not present

## 2022-07-29 DIAGNOSIS — E876 Hypokalemia: Secondary | ICD-10-CM

## 2022-07-29 DIAGNOSIS — R059 Cough, unspecified: Secondary | ICD-10-CM | POA: Insufficient documentation

## 2022-07-29 DIAGNOSIS — Z20822 Contact with and (suspected) exposure to covid-19: Secondary | ICD-10-CM | POA: Diagnosis not present

## 2022-07-29 DIAGNOSIS — E7220 Disorder of urea cycle metabolism, unspecified: Secondary | ICD-10-CM | POA: Diagnosis not present

## 2022-07-29 DIAGNOSIS — S29012A Strain of muscle and tendon of back wall of thorax, initial encounter: Secondary | ICD-10-CM | POA: Insufficient documentation

## 2022-07-29 LAB — CBC WITH DIFFERENTIAL/PLATELET
Abs Immature Granulocytes: 0.01 10*3/uL (ref 0.00–0.07)
Basophils Absolute: 0.1 10*3/uL (ref 0.0–0.1)
Basophils Relative: 1 %
Eosinophils Absolute: 0.5 10*3/uL (ref 0.0–0.5)
Eosinophils Relative: 8 %
HCT: 27.9 % — ABNORMAL LOW (ref 36.0–46.0)
Hemoglobin: 8.4 g/dL — ABNORMAL LOW (ref 12.0–15.0)
Immature Granulocytes: 0 %
Lymphocytes Relative: 23 %
Lymphs Abs: 1.4 10*3/uL (ref 0.7–4.0)
MCH: 29.7 pg (ref 26.0–34.0)
MCHC: 30.1 g/dL (ref 30.0–36.0)
MCV: 98.6 fL (ref 80.0–100.0)
Monocytes Absolute: 0.6 10*3/uL (ref 0.1–1.0)
Monocytes Relative: 10 %
Neutro Abs: 3.5 10*3/uL (ref 1.7–7.7)
Neutrophils Relative %: 58 %
Platelets: 151 10*3/uL (ref 150–400)
RBC: 2.83 MIL/uL — ABNORMAL LOW (ref 3.87–5.11)
RDW: 17 % — ABNORMAL HIGH (ref 11.5–15.5)
WBC: 5.9 10*3/uL (ref 4.0–10.5)
nRBC: 0 % (ref 0.0–0.2)
nRBC: 0 /100 WBC

## 2022-07-29 LAB — COMPREHENSIVE METABOLIC PANEL
ALT: 25 U/L (ref 0–44)
AST: 39 U/L (ref 15–41)
Albumin: 2.2 g/dL — ABNORMAL LOW (ref 3.5–5.0)
Alkaline Phosphatase: 147 U/L — ABNORMAL HIGH (ref 38–126)
Anion gap: 5 (ref 5–15)
BUN: 20 mg/dL (ref 8–23)
CO2: 21 mmol/L — ABNORMAL LOW (ref 22–32)
Calcium: 7.7 mg/dL — ABNORMAL LOW (ref 8.9–10.3)
Chloride: 110 mmol/L (ref 98–111)
Creatinine, Ser: 1.35 mg/dL — ABNORMAL HIGH (ref 0.44–1.00)
GFR, Estimated: 44 mL/min — ABNORMAL LOW (ref >60)
Glucose, Bld: 168 mg/dL — ABNORMAL HIGH (ref 70–99)
Potassium: 3.3 mmol/L — ABNORMAL LOW (ref 3.5–5.1)
Sodium: 136 mmol/L (ref 135–145)
Total Bilirubin: 2.1 mg/dL — ABNORMAL HIGH (ref 0.3–1.2)
Total Protein: 5.6 g/dL — ABNORMAL LOW (ref 6.5–8.1)

## 2022-07-29 LAB — URINALYSIS, ROUTINE W REFLEX MICROSCOPIC
Bilirubin Urine: NEGATIVE
Glucose, UA: NEGATIVE mg/dL
Hgb urine dipstick: NEGATIVE
Ketones, ur: NEGATIVE mg/dL
Leukocytes,Ua: NEGATIVE
Nitrite: NEGATIVE
Protein, ur: NEGATIVE mg/dL
Specific Gravity, Urine: 1.008 (ref 1.005–1.030)
pH: 5 (ref 5.0–8.0)

## 2022-07-29 LAB — AMMONIA: Ammonia: 83 umol/L — ABNORMAL HIGH (ref 9–35)

## 2022-07-29 LAB — MAGNESIUM: Magnesium: 1.4 mg/dL — ABNORMAL LOW (ref 1.7–2.4)

## 2022-07-29 LAB — LIPASE, BLOOD: Lipase: 56 U/L — ABNORMAL HIGH (ref 11–51)

## 2022-07-29 LAB — SARS CORONAVIRUS 2 BY RT PCR: SARS Coronavirus 2 by RT PCR: NEGATIVE

## 2022-07-29 MED ORDER — POTASSIUM CHLORIDE CRYS ER 20 MEQ PO TBCR
40.0000 meq | EXTENDED_RELEASE_TABLET | Freq: Once | ORAL | Status: AC
  Administered 2022-07-29: 40 meq via ORAL

## 2022-07-29 MED ORDER — MAGNESIUM OXIDE -MG SUPPLEMENT 400 (240 MG) MG PO TABS
400.0000 mg | ORAL_TABLET | Freq: Once | ORAL | Status: AC
  Administered 2022-07-29: 400 mg via ORAL

## 2022-07-29 MED ORDER — MAGNESIUM OXIDE -MG SUPPLEMENT 400 (240 MG) MG PO TABS
ORAL_TABLET | ORAL | Status: AC
  Filled 2022-07-29: qty 1

## 2022-07-29 MED ORDER — MAGNESIUM SULFATE IN D5W 1-5 GM/100ML-% IV SOLN
1.0000 g | Freq: Once | INTRAVENOUS | Status: DC
  Filled 2022-07-29 (×2): qty 100

## 2022-07-29 MED ORDER — PROMETHAZINE HCL 25 MG PO TABS
12.5000 mg | ORAL_TABLET | Freq: Once | ORAL | Status: AC
  Administered 2022-07-29: 12.5 mg via ORAL

## 2022-07-29 MED ORDER — OXYCODONE HCL 5 MG PO TABS
10.0000 mg | ORAL_TABLET | Freq: Once | ORAL | Status: AC
  Administered 2022-07-29: 10 mg via ORAL

## 2022-07-29 NOTE — ED Notes (Signed)
Asked patient to please provide urine sample. Patient said she is not able to right now.

## 2022-07-29 NOTE — Discharge Instructions (Addendum)
Thank you for letting us take care of you today.  Your labs were mostly close to your baseline. Your ammonia level is elevated to 83. Take a dose of lactulose for 2 days to help with elevated ammonia level.   Your back pain is likely related to your recent increase in activity. Your potassium and magnesium were slightly low so we gave you medication to replace this in the ED.  Take your home pain medication as needed for severe, uncontrolled pain in addition to your home muscle relaxers daily. I recommend avoiding the hydrocodone as possible as too many medications can affect your cognition and worsen your liver disease. I am providing exercises to help rehabilitate your back at home until you can follow up with your orthopedic and/or PCP. You may need injections in your neck similar to those you have received in the past or need additional physical therapy to help with your back pain.  For new or worsening symptoms, return to nearest ED for re-evaluation.

## 2022-07-29 NOTE — ED Triage Notes (Signed)
Pt arrived POV with family. Reports upper back pain x1 week, non radiating. States last Saturday had 5 episodes of vomiting when it started. MAE, respirations even and unlabored. AAOX4. Denies cp,sob,dizziness or any other symptoms at this time.

## 2022-07-29 NOTE — ED Provider Notes (Signed)
[place] EMERGENCY DEPARTMENT AT [facility] Provider Note   CSN: [id number] Arrival date & time: 07/29/22  1225     History  Chief Complaint  Patient presents with   Back Pain    Upper back pain x1 week    [name] is a 67 y.o. female with PMH non-alcoholic cirrhosis, HTN, thyroid disease, PE who presents to ED with son for upper back pain for the last week. Pt has known history of chronic neck and back pain for which she sees back specialists for injections. Last was ~1.5 years ago. Reports she typically has to get these about every 1.5 to 2 years. Takes Robaxin at home daily and hydrocodone infrequently as needed. Pt recently with multiple hospitalizations for sepsis secondary to UTI, hepatic encephalopathy. Son states pt doing well since hospital discharge overall and has moved recently from requiring wheelchair to ambulating with a walker. Questions if she has strained upper back due to recent increased activity. Pt notes pain all the way across upper back and worse with movement. Son states one week ago pt developed pain but also had what he suspects were viral symptoms including generalized abdominal pain and vomiting but these have resolved. She has diarrhea chronically. Son also notes pt with recently developed cough and history of sepsis secondary to pneumonia requiring mechanical ventilation. No shortness of breath, chest pain, or fever. Not currently on antibiotics, denies dysuria, hematuria, or hesitancy. Son requests ammonia level stating "do not be alarmed if it is in 40-50 range as that is her normal." Also notes she has had similar symptoms before with low potassium and magnesium levels.       Home Medications Prior to Admission medications   Medication Sig Start Date End Date Taking? Authorizing Provider  acetaminophen (TYLENOL) 325 MG tablet Take 650 mg by mouth every 6 (six) hours as needed for moderate pain or headache. Patient used this medication for  her headache.    [provider]  allopurinol (ZYLOPRIM) 100 MG tablet Take 100 mg by mouth daily. 12/31/21   [provider]  atorvastatin (LIPITOR) 40 MG tablet Take 40 mg by mouth in the morning. 01/19/22   [provider]  colchicine 0.6 MG tablet Take 0.6 mg by mouth daily as needed (gout).    [provider]  cyanocobalamin (VITAMIN B12) 1000 MCG tablet Take 2,000 mcg by mouth daily.    [provider]  EPINEPHrine 0.3 mg/0.3 mL IJ SOAJ injection Inject 0.3 mg into the muscle as needed for anaphylaxis. 08/07/19   [provider]  famotidine (PEPCID) 20 MG tablet Take 1 tablet by mouth daily. 06/08/22   [provider]  folic acid (FOLVITE) 1 MG tablet Take 1 tablet (1 mg total) by mouth daily. 06/16/22   Calvert Cantor, MD  furosemide (LASIX) 40 MG tablet Take 1 tablet (40 mg total) by mouth daily. 05/22/22   Leatha Gilding, MD  HYDROcodone-acetaminophen (NORCO) 10-325 MG tablet Take 1 tablet by mouth every 6 (six) hours as needed for moderate pain or severe pain. 02/25/22   [provider]  hydrOXYzine (ATARAX/VISTARIL) 25 MG tablet Take 25 mg by mouth 3 (three) times daily as needed for itching.    [provider]  IRON-VITAMIN C PO Take 1 tablet by mouth daily.    [provider]  lactulose (CHRONULAC) 10 GM/15ML solution Take 15 mLs (10 g total) by mouth 2 (two) times daily. Patient taking differently: Take 10 g by mouth 2 (  two) times daily as needed for mild constipation or moderate constipation. 05/10/22   Osvaldo Shipper, MD  levothyroxine (SYNTHROID) 150 MCG tablet Take 150 mcg by mouth daily before breakfast. 10/26/21   [provider]  magnesium gluconate (MAGONATE) 500 MG tablet Take 1,000 mg by mouth daily.    [provider]  methocarbamol (ROBAXIN) 500 MG tablet Take 500 mg by mouth daily. 03/04/22   [provider]  metoprolol succinate (TOPROL-XL) 25 MG 24 hr tablet  Take 12.5 mg by mouth daily as needed (afib).    [provider]  midodrine (PROAMATINE) 5 MG tablet Take 5 mg by mouth daily as needed (low blood pressure).    [provider]  naloxone [facility]) nasal spray 4 mg/0.1 mL Place 1 spray into the nose as needed (accidental overdose). 03/25/22   [provider]  neomycin-polymyxin b-dexamethasone (MAXITROL) 3.5-10000-0.1 OINT Place 1 Application into the left eye 3 (three) times daily.    [provider]  nystatin cream (MYCOSTATIN) Apply topically 2 (two) times daily. Patient taking differently: Apply 1 Application topically 2 (two) times daily as needed for dry skin. 05/22/22   Leatha Gilding, MD  pantoprazole (PROTONIX) 40 MG tablet Take 1 tablet (40 mg total) by mouth daily. 05/10/22   Osvaldo Shipper, MD  promethazine (PHENERGAN) 12.5 MG tablet Take 12.5 mg by mouth every 6 (six) hours as needed for nausea or vomiting.    [provider]  rifaximin (XIFAXAN) 550 MG TABS tablet Take 550 mg by mouth 2 (two) times daily.    [provider]  spironolactone (ALDACTONE) 25 MG tablet Take 0.5 tablets (12.5 mg total) by mouth daily. Patient taking differently: Take 25 mg by mouth daily. 05/22/22   Leatha Gilding, MD  triamcinolone cream (KENALOG) 0.5 % Apply to affected area of right breast twice daily as directed. Patient taking differently: Apply 1 Application topically 2 (two) times daily as needed (rash). 05/10/22   Osvaldo Shipper, MD  venlafaxine XR (EFFEXOR-XR) 75 MG 24 hr capsule Take 75 mg by mouth daily with breakfast. 08/17/21   [provider]  Vitamin D, Ergocalciferol, (DRISDOL) 50000 UNITS CAPS Take 50,000 Units by mouth every 7 (seven) days.    [provider]      Allergies    Ivp dye [iodinated contrast media], Ace inhibitors, Chlorhexidine, Doxycycline, Penicillins, and Zofran [ondansetron]    Review of Systems   Review of Systems  All other systems reviewed and  are negative.   Physical Exam Updated Vital Signs BP 112/80   Pulse 78   Temp 98 F (36.7 C) (Oral)   Resp 15   Ht 5\' 2"  (1.575 m)   Wt 106.6 kg   SpO2 98%   BMI 42.98 kg/m  Physical Exam Vitals and nursing note reviewed.  Constitutional:      General: She is not in acute distress.    Appearance: Normal appearance. She is not ill-appearing or toxic-appearing.  HENT:     Head: Normocephalic and atraumatic.     Mouth/Throat:     Mouth: Mucous membranes are moist.  Eyes:     General: Scleral icterus present.  Neck:     Comments: Diffuse tenderness across musculature of upper back bilaterally, no specific tenderness to midline, no meningismus Cardiovascular:     Rate and Rhythm: Normal rate and regular rhythm.     Heart sounds: No murmur heard. Pulmonary:     Effort: Pulmonary effort is normal.  Breath sounds: Normal breath sounds.  Abdominal:     General: Abdomen is flat. There is no distension.     Palpations: Abdomen is soft.     Tenderness: There is no abdominal tenderness. There is no guarding or rebound.  Musculoskeletal:     Cervical back: Normal range of motion and neck supple. No rigidity.     Right lower leg: No edema.     Left lower leg: No edema.     Comments: No midline TL spinal tenderness, stepoffs, or deformities, MAE x 4, 5/5 strength to bilateral upper and lower extremities  Skin:    General: Skin is warm and dry.     Capillary Refill: Capillary refill takes less than 2 seconds.  Neurological:     General: No focal deficit present.     Mental Status: She is alert and oriented to person, place, and time.     GCS: GCS eye subscore is 4. GCS verbal subscore is 5. GCS motor subscore is 6.     Cranial Nerves: Cranial nerves 2-12 are intact.     Motor: Motor function is intact.  Psychiatric:        Behavior: Behavior normal.     ED Results / Procedures / Treatments   Labs (all labs ordered are listed, but only abnormal results are displayed) Labs  Reviewed  AMMONIA - Abnormal; Notable for the following components:      Result Value   Ammonia 83 (*)    All other components within normal limits  MAGNESIUM - Abnormal; Notable for the following components:   Magnesium 1.4 (*)    All other components within normal limits  COMPREHENSIVE METABOLIC PANEL - Abnormal; Notable for the following components:   Potassium 3.3 (*)    CO2 21 (*)    Glucose, Bld 168 (*)    Creatinine, Ser 1.35 (*)    Calcium 7.7 (*)    Total Protein 5.6 (*)    Albumin 2.2 (*)    Alkaline Phosphatase 147 (*)    Total Bilirubin 2.1 (*)    GFR, Estimated 44 (*)    All other components within normal limits  CBC WITH DIFFERENTIAL/PLATELET - Abnormal; Notable for the following components:   RBC 2.83 (*)    Hemoglobin 8.4 (*)    HCT 27.9 (*)    RDW 17.0 (*)    All other components within normal limits  URINALYSIS, ROUTINE W REFLEX MICROSCOPIC - Abnormal; Notable for the following components:   APPearance HAZY (*)    All other components within normal limits  LIPASE, BLOOD - Abnormal; Notable for the following components:   Lipase 56 (*)    All other components within normal limits  SARS CORONAVIRUS 2 BY RT PCR    EKG EKG Interpretation Date/Time:  Friday July 29 2022 13:05:37 EDT Ventricular Rate:  73 PR Interval:  179 QRS Duration:  101 QT Interval:  425 QTC Calculation: 469 R Axis:   6  Text Interpretation: Sinus rhythm Low voltage, precordial leads Confirmed by Cathren Laine ([id number]) on 07/29/2022 1:55:42 PM  Radiology DG Chest 2 View  Result Date: 07/29/2022 CLINICAL DATA:  Cough. Upper back pain for 1 week. Vomiting Saturday. EXAM: CHEST - 2 VIEW COMPARISON:  06/12/2022 FINDINGS: Shallow inspiration. Cardiac enlargement. Linear scarring in the left mid lung. No vascular congestion, edema, or consolidation. No pleural effusions. No pneumothorax. Mediastinal contours appear intact. IMPRESSION: Cardiac enlargement.  No evidence of active pulmonary  disease. Electronically Signed   By:  Burman Nieves M.D.   On: 07/29/2022 15:18    Procedures Procedures    Medications Ordered in ED Medications  magnesium oxide (MAG-OX) 400 (240 Mg) MG tablet (has no administration in time range)  oxyCODONE (Oxy IR/ROXICODONE) immediate release tablet 10 mg (10 mg Oral Given 07/29/22 1412)  promethazine (PHENERGAN) tablet 12.5 mg (12.5 mg Oral Given 07/29/22 1411)  potassium chloride SA (KLOR-CON M) CR tablet 40 mEq (40 mEq Oral Given 07/29/22 1709)  magnesium oxide (MAG-OX) tablet 400 mg (400 mg Oral Given 07/29/22 1711)    ED Course/ Medical Decision Making/ A&P                             Medical Decision Making Amount and/or Complexity of Data Reviewed Labs: ordered. Decision-making details documented in ED Course. Radiology: ordered. Decision-making details documented in ED Course. ECG/medicine tests: ordered. Decision-making details documented in ED Course.  Risk OTC drugs. Prescription drug management.   Medical Decision Making:   Raea Magallon is a 67 y.o. female who presented to the ED today with back pain detailed above.    Additional history discussed with patient's family/caregivers.  Patient's presentation is complicated by their history of multiple co morbidities, recent hospitalizations.  Complete initial physical exam performed, notably the patient  was in NAD. No midline spinal tenderness, stepoffs, or deformities. No meningismus. Alert and oriented, nontoxic appearing.    Reviewed and confirmed nursing documentation for past medical history, family history, social history.    Initial Assessment:   With the patient's presentation of back pain, the emergent differential diagnosis for back pain includes but is not limited to fracture, muscle strain, cauda equina, spinal stenosis, DDD, ankylosing spondylitis, acute ligamentous injury, disk herniation, spondylolisthesis, epidural compression syndrome, metastatic cancer, transverse  myelitis, vertebral osteomyelitis, diskitis, kidney stone, pyelonephritis, AAA, Perforated ulcer, retrocecal appendicitis, pancreatitis, bowel obstruction, retroperitoneal hemorrhage or mass, meningitis.   Initial Plan:  Screening labs including CBC and Metabolic panel to evaluate for infectious or metabolic etiology of disease.  Urinalysis with reflex culture ordered to evaluate for UTI or relevant urologic/nephrologic pathology.  CXR to evaluate for structural/infectious intrathoracic pathology.  EKG to evaluate for cardiac pathology Lipase to evaluate for pancreatitis Magnesium, ammonia level at request of family Symptomatic management Objective evaluation as reviewed   Initial Study Results:   Laboratory  All laboratory results reviewed without evidence of clinically relevant pathology.   Exceptions include: K 3.3, Cr 1.35, Ca 7.7, Mg 1.4, alk phos 147, lipase 56, GFR 44, Hgb 8.4, ammonia 83   EKG EKG was reviewed independently. ST segments without concerns for elevations.   EKG: normal sinus rhythm.   Radiology:  All images reviewed independently. Agree with radiology report at this time.   DG Chest 2 View  Result Date: 07/29/2022 CLINICAL DATA:  Cough. Upper back pain for 1 week. Vomiting Saturday. EXAM: CHEST - 2 VIEW COMPARISON:  06/12/2022 FINDINGS: Shallow inspiration. Cardiac enlargement. Linear scarring in the left mid lung. No vascular congestion, edema, or consolidation. No pleural effusions. No pneumothorax. Mediastinal contours appear intact. IMPRESSION: Cardiac enlargement.  No evidence of active pulmonary disease. Electronically Signed   By: Burman Nieves M.D.   On: 07/29/2022 15:18      Final Assessment and Plan:   Patient presents to ED c/o upper back pain.  Patient does have a history of chronic neck and back pain.  She has been recently hospitalized for multiple indications though including hepatic  encephalopathy, sepsis secondary to UTI.  She was previously  wheelchair-bound but has recently transition to a walker.  Son at bedside notes that with increased activity in place that patient strained her upper back that she has been complaining of uncontrolled upper back pain.  Patient also notes that she usually receives injections into her neck about every year and a half and it has been about a year and a half since her last injections and this may be an acute exacerbation of her chronic pain.  She is mentating appropriately.  She did have some episodes of vomiting about a week ago but this is resolved.  Recommendations above for further assessment.  She has some mild electrolyte derangements which she frequently has.  Potassium and magnesium repleted orally.  Creatinine appears to be only slightly elevated from baseline.  Ammonia level that was requested last sign returns 83.  He states that her baseline is around 50.  He confirms that other than the vomiting a week ago she has not had any further volume losses, has been eating and drinking well, and has been mentating appropriately.  Currently alert and oriented.  She is currently only taking lactulose as needed and does not use it frequently.  She was evaluated by her outpatient GI last week with a good report.  Discussed with attending physician who cosigned this note and will have patient do 2 doses of lactulose at home. Will have her treat back pain with home medications. No signs of infectious process, UTI.  No acute findings on chest x-ray.  Patient had recent MRI scan of the T and L-spine showed disc herniations and spinal canal stenosis.  She has not had a fall or injury since this previous imaging. Discussed with pt/son, pt also evaluated by attending physician, and with mostly reassuring workup will discharge pt home to closely follow up with primary care. She has pain medication at home she can take as needed. Discussed with son pt may need increased PT which can be arranged by PCP. Strict ED return  precautions given, all questions answered, and stable for discharge.    Clinical Impression:  1. Back strain, initial encounter   2. Hypokalemia   3. Hypomagnesemia   4. Upper back pain   5. Increased ammonia level      Discharge           Final Clinical Impression(s) / ED Diagnoses Final diagnoses:  Hypokalemia  Hypomagnesemia  Upper back pain  Increased ammonia level  Back strain, initial encounter    Rx / DC Orders ED Discharge Orders     None         Richardson Dopp 07/29/22 1825    Cathren Laine, MD 07/31/22 [phone]

## 2022-08-01 DIAGNOSIS — M1712 Unilateral primary osteoarthritis, left knee: Secondary | ICD-10-CM | POA: Insufficient documentation

## 2022-08-01 DIAGNOSIS — M1711 Unilateral primary osteoarthritis, right knee: Secondary | ICD-10-CM | POA: Insufficient documentation

## 2022-08-11 ENCOUNTER — Inpatient Hospital Stay (HOSPITAL_COMMUNITY)
Admission: EM | Admit: 2022-08-11 | Discharge: 2022-09-09 | DRG: 871 | Disposition: A | Discharge status: Home Health | Payer: Medicare (Managed Care) | Attending: Internal Medicine | Admitting: Internal Medicine

## 2022-08-11 ENCOUNTER — Emergency Department (HOSPITAL_COMMUNITY): Payer: Medicare (Managed Care)

## 2022-08-11 ENCOUNTER — Other Ambulatory Visit: Payer: Self-pay

## 2022-08-11 ENCOUNTER — Encounter (HOSPITAL_COMMUNITY): Payer: Self-pay | Admitting: Emergency Medicine

## 2022-08-11 DIAGNOSIS — E039 Hypothyroidism, unspecified: Secondary | ICD-10-CM | POA: Diagnosis present

## 2022-08-11 DIAGNOSIS — Z881 Allergy status to other antibiotic agents status: Secondary | ICD-10-CM

## 2022-08-11 DIAGNOSIS — R5381 Other malaise: Secondary | ICD-10-CM | POA: Diagnosis not present

## 2022-08-11 DIAGNOSIS — Z66 Do not resuscitate: Secondary | ICD-10-CM | POA: Diagnosis present

## 2022-08-11 DIAGNOSIS — I5033 Acute on chronic diastolic (congestive) heart failure: Secondary | ICD-10-CM | POA: Diagnosis not present

## 2022-08-11 DIAGNOSIS — K7460 Unspecified cirrhosis of liver: Secondary | ICD-10-CM

## 2022-08-11 DIAGNOSIS — G8929 Other chronic pain: Secondary | ICD-10-CM | POA: Diagnosis present

## 2022-08-11 DIAGNOSIS — Z6841 Body Mass Index (BMI) 40.0 and over, adult: Secondary | ICD-10-CM | POA: Diagnosis not present

## 2022-08-11 DIAGNOSIS — Z86718 Personal history of other venous thrombosis and embolism: Secondary | ICD-10-CM

## 2022-08-11 DIAGNOSIS — K912 Postsurgical malabsorption, not elsewhere classified: Secondary | ICD-10-CM | POA: Diagnosis present

## 2022-08-11 DIAGNOSIS — Z888 Allergy status to other drugs, medicaments and biological substances status: Secondary | ICD-10-CM

## 2022-08-11 DIAGNOSIS — Z23 Encounter for immunization: Secondary | ICD-10-CM | POA: Diagnosis present

## 2022-08-11 DIAGNOSIS — Y838 Other surgical procedures as the cause of abnormal reaction of the patient, or of later complication, without mention of misadventure at the time of the procedure: Secondary | ICD-10-CM | POA: Diagnosis present

## 2022-08-11 DIAGNOSIS — L03116 Cellulitis of left lower limb: Secondary | ICD-10-CM | POA: Diagnosis present

## 2022-08-11 DIAGNOSIS — K7581 Nonalcoholic steatohepatitis (NASH): Secondary | ICD-10-CM | POA: Diagnosis not present

## 2022-08-11 DIAGNOSIS — G479 Sleep disorder, unspecified: Secondary | ICD-10-CM | POA: Diagnosis not present

## 2022-08-11 DIAGNOSIS — Z1624 Resistance to multiple antibiotics: Secondary | ICD-10-CM | POA: Diagnosis not present

## 2022-08-11 DIAGNOSIS — I110 Hypertensive heart disease with heart failure: Secondary | ICD-10-CM | POA: Diagnosis present

## 2022-08-11 DIAGNOSIS — I9589 Other hypotension: Secondary | ICD-10-CM | POA: Diagnosis present

## 2022-08-11 DIAGNOSIS — N179 Acute kidney failure, unspecified: Secondary | ICD-10-CM | POA: Diagnosis not present

## 2022-08-11 DIAGNOSIS — D869 Sarcoidosis, unspecified: Secondary | ICD-10-CM | POA: Diagnosis present

## 2022-08-11 DIAGNOSIS — N170 Acute kidney failure with tubular necrosis: Secondary | ICD-10-CM | POA: Diagnosis not present

## 2022-08-11 DIAGNOSIS — R519 Headache, unspecified: Secondary | ICD-10-CM | POA: Diagnosis not present

## 2022-08-11 DIAGNOSIS — R0602 Shortness of breath: Secondary | ICD-10-CM | POA: Diagnosis present

## 2022-08-11 DIAGNOSIS — G9341 Metabolic encephalopathy: Secondary | ICD-10-CM | POA: Diagnosis present

## 2022-08-11 DIAGNOSIS — R579 Shock, unspecified: Secondary | ICD-10-CM | POA: Diagnosis not present

## 2022-08-11 DIAGNOSIS — E7220 Disorder of urea cycle metabolism, unspecified: Secondary | ICD-10-CM | POA: Diagnosis not present

## 2022-08-11 DIAGNOSIS — X58XXXA Exposure to other specified factors, initial encounter: Secondary | ICD-10-CM | POA: Diagnosis present

## 2022-08-11 DIAGNOSIS — R1310 Dysphagia, unspecified: Secondary | ICD-10-CM | POA: Diagnosis not present

## 2022-08-11 DIAGNOSIS — K219 Gastro-esophageal reflux disease without esophagitis: Secondary | ICD-10-CM | POA: Diagnosis present

## 2022-08-11 DIAGNOSIS — Z7189 Other specified counseling: Secondary | ICD-10-CM

## 2022-08-11 DIAGNOSIS — K766 Portal hypertension: Secondary | ICD-10-CM | POA: Diagnosis present

## 2022-08-11 DIAGNOSIS — K7682 Hepatic encephalopathy: Secondary | ICD-10-CM | POA: Diagnosis not present

## 2022-08-11 DIAGNOSIS — R6521 Severe sepsis with septic shock: Secondary | ICD-10-CM | POA: Diagnosis present

## 2022-08-11 DIAGNOSIS — R278 Other lack of coordination: Secondary | ICD-10-CM | POA: Diagnosis not present

## 2022-08-11 DIAGNOSIS — E041 Nontoxic single thyroid nodule: Secondary | ICD-10-CM | POA: Diagnosis present

## 2022-08-11 DIAGNOSIS — Z1152 Encounter for screening for COVID-19: Secondary | ICD-10-CM

## 2022-08-11 DIAGNOSIS — Z87891 Personal history of nicotine dependence: Secondary | ICD-10-CM

## 2022-08-11 DIAGNOSIS — K529 Noninfective gastroenteritis and colitis, unspecified: Secondary | ICD-10-CM | POA: Diagnosis present

## 2022-08-11 DIAGNOSIS — Z79890 Hormone replacement therapy (postmenopausal): Secondary | ICD-10-CM

## 2022-08-11 DIAGNOSIS — D539 Nutritional anemia, unspecified: Secondary | ICD-10-CM | POA: Diagnosis present

## 2022-08-11 DIAGNOSIS — K7290 Hepatic failure, unspecified without coma: Secondary | ICD-10-CM

## 2022-08-11 DIAGNOSIS — A4159 Other Gram-negative sepsis: Principal | ICD-10-CM | POA: Diagnosis present

## 2022-08-11 DIAGNOSIS — R451 Restlessness and agitation: Secondary | ICD-10-CM | POA: Diagnosis not present

## 2022-08-11 DIAGNOSIS — D62 Acute posthemorrhagic anemia: Secondary | ICD-10-CM | POA: Diagnosis not present

## 2022-08-11 DIAGNOSIS — A419 Sepsis, unspecified organism: Secondary | ICD-10-CM | POA: Diagnosis present

## 2022-08-11 DIAGNOSIS — E872 Acidosis, unspecified: Secondary | ICD-10-CM | POA: Diagnosis present

## 2022-08-11 DIAGNOSIS — M25551 Pain in right hip: Secondary | ICD-10-CM | POA: Diagnosis not present

## 2022-08-11 DIAGNOSIS — Z883 Allergy status to other anti-infective agents status: Secondary | ICD-10-CM

## 2022-08-11 DIAGNOSIS — N3289 Other specified disorders of bladder: Secondary | ICD-10-CM | POA: Diagnosis not present

## 2022-08-11 DIAGNOSIS — D684 Acquired coagulation factor deficiency: Secondary | ICD-10-CM | POA: Diagnosis present

## 2022-08-11 DIAGNOSIS — R7881 Bacteremia: Secondary | ICD-10-CM | POA: Diagnosis not present

## 2022-08-11 DIAGNOSIS — E876 Hypokalemia: Secondary | ICD-10-CM | POA: Diagnosis present

## 2022-08-11 DIAGNOSIS — Z87440 Personal history of urinary (tract) infections: Secondary | ICD-10-CM

## 2022-08-11 DIAGNOSIS — F419 Anxiety disorder, unspecified: Secondary | ICD-10-CM | POA: Diagnosis present

## 2022-08-11 DIAGNOSIS — N390 Urinary tract infection, site not specified: Secondary | ICD-10-CM | POA: Diagnosis present

## 2022-08-11 DIAGNOSIS — Z79899 Other long term (current) drug therapy: Secondary | ICD-10-CM

## 2022-08-11 DIAGNOSIS — I350 Nonrheumatic aortic (valve) stenosis: Secondary | ICD-10-CM | POA: Diagnosis present

## 2022-08-11 DIAGNOSIS — E785 Hyperlipidemia, unspecified: Secondary | ICD-10-CM | POA: Diagnosis present

## 2022-08-11 DIAGNOSIS — R609 Edema, unspecified: Secondary | ICD-10-CM | POA: Diagnosis not present

## 2022-08-11 DIAGNOSIS — Z781 Physical restraint status: Secondary | ICD-10-CM

## 2022-08-11 DIAGNOSIS — M438X4 Other specified deforming dorsopathies, thoracic region: Secondary | ICD-10-CM | POA: Diagnosis present

## 2022-08-11 DIAGNOSIS — M4854XA Collapsed vertebra, not elsewhere classified, thoracic region, initial encounter for fracture: Secondary | ICD-10-CM | POA: Diagnosis present

## 2022-08-11 DIAGNOSIS — I959 Hypotension, unspecified: Secondary | ICD-10-CM | POA: Diagnosis not present

## 2022-08-11 DIAGNOSIS — R188 Other ascites: Secondary | ICD-10-CM | POA: Diagnosis present

## 2022-08-11 DIAGNOSIS — K7469 Other cirrhosis of liver: Secondary | ICD-10-CM | POA: Diagnosis not present

## 2022-08-11 DIAGNOSIS — E871 Hypo-osmolality and hyponatremia: Secondary | ICD-10-CM | POA: Diagnosis present

## 2022-08-11 DIAGNOSIS — M545 Low back pain, unspecified: Secondary | ICD-10-CM | POA: Diagnosis present

## 2022-08-11 DIAGNOSIS — E861 Hypovolemia: Secondary | ICD-10-CM | POA: Diagnosis not present

## 2022-08-11 DIAGNOSIS — B961 Klebsiella pneumoniae [K. pneumoniae] as the cause of diseases classified elsewhere: Secondary | ICD-10-CM | POA: Diagnosis not present

## 2022-08-11 DIAGNOSIS — H6121 Impacted cerumen, right ear: Secondary | ICD-10-CM | POA: Diagnosis present

## 2022-08-11 DIAGNOSIS — F32A Depression, unspecified: Secondary | ICD-10-CM | POA: Diagnosis present

## 2022-08-11 DIAGNOSIS — Z9049 Acquired absence of other specified parts of digestive tract: Secondary | ICD-10-CM

## 2022-08-11 DIAGNOSIS — H9203 Otalgia, bilateral: Secondary | ICD-10-CM | POA: Diagnosis not present

## 2022-08-11 DIAGNOSIS — Z86711 Personal history of pulmonary embolism: Secondary | ICD-10-CM

## 2022-08-11 DIAGNOSIS — D6959 Other secondary thrombocytopenia: Secondary | ICD-10-CM | POA: Diagnosis present

## 2022-08-11 DIAGNOSIS — H5711 Ocular pain, right eye: Secondary | ICD-10-CM | POA: Diagnosis present

## 2022-08-11 DIAGNOSIS — N950 Postmenopausal bleeding: Secondary | ICD-10-CM | POA: Diagnosis not present

## 2022-08-11 DIAGNOSIS — I480 Paroxysmal atrial fibrillation: Secondary | ICD-10-CM | POA: Diagnosis present

## 2022-08-11 DIAGNOSIS — H5462 Unqualified visual loss, left eye, normal vision right eye: Secondary | ICD-10-CM | POA: Diagnosis present

## 2022-08-11 DIAGNOSIS — E8809 Other disorders of plasma-protein metabolism, not elsewhere classified: Secondary | ICD-10-CM | POA: Diagnosis present

## 2022-08-11 DIAGNOSIS — B9689 Other specified bacterial agents as the cause of diseases classified elsewhere: Secondary | ICD-10-CM | POA: Diagnosis not present

## 2022-08-11 DIAGNOSIS — A281 Cat-scratch disease: Secondary | ICD-10-CM | POA: Diagnosis present

## 2022-08-11 DIAGNOSIS — D259 Leiomyoma of uterus, unspecified: Secondary | ICD-10-CM | POA: Diagnosis present

## 2022-08-11 DIAGNOSIS — Z880 Allergy status to penicillin: Secondary | ICD-10-CM

## 2022-08-11 DIAGNOSIS — Z87442 Personal history of urinary calculi: Secondary | ICD-10-CM

## 2022-08-11 DIAGNOSIS — Z91041 Radiographic dye allergy status: Secondary | ICD-10-CM

## 2022-08-11 DIAGNOSIS — R011 Cardiac murmur, unspecified: Secondary | ICD-10-CM | POA: Diagnosis present

## 2022-08-11 LAB — URINALYSIS, W/ REFLEX TO CULTURE (INFECTION SUSPECTED)
Bilirubin Urine: NEGATIVE
Glucose, UA: NEGATIVE mg/dL
Hgb urine dipstick: NEGATIVE
Ketones, ur: NEGATIVE mg/dL
Nitrite: NEGATIVE
Protein, ur: 30 mg/dL — AB
Specific Gravity, Urine: 1.018 (ref 1.005–1.030)
pH: 5 (ref 5.0–8.0)

## 2022-08-11 LAB — RESP PANEL BY RT-PCR (RSV, FLU A&B, COVID)  RVPGX2
Influenza A by PCR: NEGATIVE
Influenza B by PCR: NEGATIVE
Resp Syncytial Virus by PCR: NEGATIVE
SARS Coronavirus 2 by RT PCR: NEGATIVE

## 2022-08-11 LAB — GLUCOSE, CAPILLARY
Glucose-Capillary: 108 mg/dL — ABNORMAL HIGH (ref 70–99)
Glucose-Capillary: 82 mg/dL (ref 70–99)
Glucose-Capillary: 95 mg/dL (ref 70–99)

## 2022-08-11 LAB — CBC WITH DIFFERENTIAL/PLATELET
Abs Immature Granulocytes: 0.06 10*3/uL (ref 0.00–0.07)
Basophils Absolute: 0 10*3/uL (ref 0.0–0.1)
Basophils Relative: 0 %
Eosinophils Absolute: 0.1 10*3/uL (ref 0.0–0.5)
Eosinophils Relative: 2 %
HCT: 24.6 % — ABNORMAL LOW (ref 36.0–46.0)
Hemoglobin: 7.5 g/dL — ABNORMAL LOW (ref 12.0–15.0)
Immature Granulocytes: 1 %
Lymphocytes Relative: 4 %
Lymphs Abs: 0.3 10*3/uL — ABNORMAL LOW (ref 0.7–4.0)
MCH: 30.4 pg (ref 26.0–34.0)
MCHC: 30.5 g/dL (ref 30.0–36.0)
MCV: 99.6 fL (ref 80.0–100.0)
Monocytes Absolute: 0.3 10*3/uL (ref 0.1–1.0)
Monocytes Relative: 5 %
Neutro Abs: 5.4 10*3/uL (ref 1.7–7.7)
Neutrophils Relative %: 88 %
Platelets: 96 10*3/uL — ABNORMAL LOW (ref 150–400)
RBC: 2.47 MIL/uL — ABNORMAL LOW (ref 3.87–5.11)
RDW: 17.1 % — ABNORMAL HIGH (ref 11.5–15.5)
WBC: 6.2 10*3/uL (ref 4.0–10.5)
nRBC: 0 % (ref 0.0–0.2)

## 2022-08-11 LAB — COMPREHENSIVE METABOLIC PANEL
ALT: 28 U/L (ref 0–44)
AST: 32 U/L (ref 15–41)
Albumin: 1.9 g/dL — ABNORMAL LOW (ref 3.5–5.0)
Alkaline Phosphatase: 98 U/L (ref 38–126)
Anion gap: 14 (ref 5–15)
BUN: 37 mg/dL — ABNORMAL HIGH (ref 8–23)
CO2: 13 mmol/L — ABNORMAL LOW (ref 22–32)
Calcium: 7.8 mg/dL — ABNORMAL LOW (ref 8.9–10.3)
Chloride: 103 mmol/L (ref 98–111)
Creatinine, Ser: 2.41 mg/dL — ABNORMAL HIGH (ref 0.44–1.00)
GFR, Estimated: 22 mL/min — ABNORMAL LOW (ref >60)
Glucose, Bld: 108 mg/dL — ABNORMAL HIGH (ref 70–99)
Potassium: 3.7 mmol/L (ref 3.5–5.1)
Sodium: 130 mmol/L — ABNORMAL LOW (ref 135–145)
Total Bilirubin: 2.2 mg/dL — ABNORMAL HIGH (ref 0.3–1.2)
Total Protein: 5 g/dL — ABNORMAL LOW (ref 6.5–8.1)

## 2022-08-11 LAB — PROTIME-INR
INR: 3.4 — ABNORMAL HIGH (ref 0.8–1.2)
Prothrombin Time: 34.2 seconds — ABNORMAL HIGH (ref 11.4–15.2)

## 2022-08-11 LAB — CORTISOL: Cortisol, Plasma: 27.7 ug/dL

## 2022-08-11 LAB — I-STAT CG4 LACTIC ACID, ED
Lactic Acid, Venous: 6.1 mmol/L (ref 0.5–1.9)
Lactic Acid, Venous: 4.2 mmol/L (ref 0.5–1.9)

## 2022-08-11 LAB — PROCALCITONIN: Procalcitonin: 16.2 ng/mL

## 2022-08-11 LAB — MRSA NEXT GEN BY PCR, NASAL: MRSA by PCR Next Gen: NOT DETECTED

## 2022-08-11 LAB — APTT: aPTT: 49 seconds — ABNORMAL HIGH (ref 24–36)

## 2022-08-11 LAB — MAGNESIUM: Magnesium: 1.4 mg/dL — ABNORMAL LOW (ref 1.7–2.4)

## 2022-08-11 LAB — CBG MONITORING, ED: Glucose-Capillary: 59 mg/dL — ABNORMAL LOW (ref 70–99)

## 2022-08-11 LAB — AMMONIA: Ammonia: 24 umol/L (ref 9–35)

## 2022-08-11 MED ORDER — SODIUM CHLORIDE 0.9 % IV SOLN
250.0000 mL | INTRAVENOUS | Status: DC
  Administered 2022-08-11 – 2022-08-21 (×2): 250 mL via INTRAVENOUS

## 2022-08-11 MED ORDER — LACTATED RINGERS IV BOLUS (SEPSIS)
500.0000 mL | Freq: Once | INTRAVENOUS | Status: AC
  Administered 2022-08-11: 500 mL via INTRAVENOUS

## 2022-08-11 MED ORDER — SODIUM CHLORIDE 0.9 % IV SOLN
2.0000 g | Freq: Two times a day (BID) | INTRAVENOUS | Status: DC
  Administered 2022-08-11 – 2022-08-12 (×2): 2 g via INTRAVENOUS
  Filled 2022-08-11 (×2): qty 12.5

## 2022-08-11 MED ORDER — LACTULOSE 10 GM/15ML PO SOLN
10.0000 g | Freq: Two times a day (BID) | ORAL | Status: DC
  Administered 2022-08-12 – 2022-08-13 (×3): 10 g via ORAL
  Filled 2022-08-11 (×4): qty 15

## 2022-08-11 MED ORDER — VANCOMYCIN HCL 2000 MG/400ML IV SOLN
2000.0000 mg | INTRAVENOUS | Status: AC
  Administered 2022-08-11: 2000 mg via INTRAVENOUS
  Filled 2022-08-11: qty 400

## 2022-08-11 MED ORDER — PROMETHAZINE (PHENERGAN) 6.25MG IN NS 50ML IVPB
6.2500 mg | Freq: Three times a day (TID) | INTRAVENOUS | Status: DC | PRN
  Administered 2022-08-13 – 2022-08-16 (×2): 6.25 mg via INTRAVENOUS
  Filled 2022-08-11: qty 50
  Filled 2022-08-11 (×2): qty 6.25

## 2022-08-11 MED ORDER — NOREPINEPHRINE 4 MG/250ML-% IV SOLN
2.0000 ug/min | INTRAVENOUS | Status: DC
  Administered 2022-08-12: 3 ug/min via INTRAVENOUS
  Filled 2022-08-11 (×2): qty 250

## 2022-08-11 MED ORDER — HYDROCORTISONE SOD SUC (PF) 100 MG IJ SOLR
100.0000 mg | Freq: Three times a day (TID) | INTRAMUSCULAR | Status: DC
  Administered 2022-08-11 – 2022-08-13 (×5): 100 mg via INTRAVENOUS
  Filled 2022-08-11 (×5): qty 2

## 2022-08-11 MED ORDER — DEXTROSE 50 % IV SOLN
50.0000 mL | Freq: Once | INTRAVENOUS | Status: AC
  Administered 2022-08-11: 50 mL via INTRAVENOUS
  Filled 2022-08-11: qty 50

## 2022-08-11 MED ORDER — VANCOMYCIN HCL 1250 MG/250ML IV SOLN: 1250.0000 mg | INTRAVENOUS | Status: DC

## 2022-08-11 MED ORDER — MIDODRINE HCL 5 MG PO TABS
5.0000 mg | ORAL_TABLET | Freq: Three times a day (TID) | ORAL | Status: DC
  Administered 2022-08-11: 5 mg via ORAL
  Filled 2022-08-11: qty 1

## 2022-08-11 MED ORDER — NOREPINEPHRINE 4 MG/250ML-% IV SOLN
0.0000 ug/min | INTRAVENOUS | Status: DC
  Administered 2022-08-11: 2 ug/min via INTRAVENOUS
  Filled 2022-08-11: qty 250

## 2022-08-11 MED ORDER — MAGNESIUM SULFATE 2 GM/50ML IV SOLN
2.0000 g | Freq: Once | INTRAVENOUS | Status: AC
  Administered 2022-08-11: 2 g via INTRAVENOUS
  Filled 2022-08-11: qty 50

## 2022-08-11 MED ORDER — SODIUM CHLORIDE 0.9 % IV SOLN
1.0000 g | INTRAVENOUS | Status: AC
  Administered 2022-08-11: 1 g via INTRAVENOUS
  Filled 2022-08-11: qty 20

## 2022-08-11 MED ORDER — ORAL CARE MOUTH RINSE: 15.0000 mL | OROMUCOSAL | Status: DC | PRN

## 2022-08-11 MED ORDER — VANCOMYCIN HCL IN DEXTROSE 1-5 GM/200ML-% IV SOLN: 1000.0000 mg | Freq: Once | INTRAVENOUS | Status: DC

## 2022-08-11 MED ORDER — AZITHROMYCIN 250 MG PO TABS
500.0000 mg | ORAL_TABLET | Freq: Once | ORAL | Status: AC
  Administered 2022-08-11: 500 mg via ORAL
  Filled 2022-08-11: qty 2

## 2022-08-11 MED ORDER — ALBUMIN HUMAN 25 % IV SOLN
25.0000 g | Freq: Four times a day (QID) | INTRAVENOUS | Status: AC
  Administered 2022-08-11 (×3): 25 g via INTRAVENOUS
  Filled 2022-08-11 (×3): qty 100

## 2022-08-11 MED ORDER — SODIUM CHLORIDE 0.9 % IV BOLUS
500.0000 mL | Freq: Once | INTRAVENOUS | Status: AC
  Administered 2022-08-11: 500 mL via INTRAVENOUS

## 2022-08-11 MED ORDER — LACTATED RINGERS IV BOLUS
500.0000 mL | Freq: Once | INTRAVENOUS | Status: AC
  Administered 2022-08-11: 500 mL via INTRAVENOUS

## 2022-08-11 MED ORDER — CLINDAMYCIN PHOSPHATE 600 MG/50ML IV SOLN
600.0000 mg | Freq: Once | INTRAVENOUS | Status: AC
  Administered 2022-08-11: 600 mg via INTRAVENOUS
  Filled 2022-08-11: qty 50

## 2022-08-11 MED ORDER — AZITHROMYCIN 250 MG PO TABS
250.0000 mg | ORAL_TABLET | Freq: Every day | ORAL | Status: DC
  Administered 2022-08-12 – 2022-08-13 (×2): 250 mg via ORAL
  Filled 2022-08-11 (×2): qty 1

## 2022-08-11 MED ORDER — POLYETHYLENE GLYCOL 3350 17 G PO PACK: 17.0000 g | PACK | Freq: Every day | ORAL | Status: DC | PRN

## 2022-08-11 MED ORDER — HYDROCODONE-ACETAMINOPHEN 10-325 MG PO TABS
1.0000 | ORAL_TABLET | Freq: Four times a day (QID) | ORAL | Status: DC | PRN
  Administered 2022-08-11 – 2022-08-12 (×3): 1 via ORAL
  Filled 2022-08-11 (×3): qty 1

## 2022-08-11 MED ORDER — RIFAXIMIN 550 MG PO TABS
550.0000 mg | ORAL_TABLET | Freq: Two times a day (BID) | ORAL | Status: DC
  Administered 2022-08-11 – 2022-09-09 (×56): 550 mg via ORAL
  Filled 2022-08-11 (×58): qty 1

## 2022-08-11 NOTE — ED Provider Notes (Signed)
[place] EMERGENCY DEPARTMENT AT [facility] Provider Note   CSN: [id number] Arrival date & time: 08/11/22  4098     History  Chief Complaint  Patient presents with   Hypotension   Shortness of Breath    [name] is a 67 y.o. female.  [name] is a 67 y.o. female with a history of cirrhosis, HTN, PE, who presents to the emergency department accompanied by family member for evaluation of shortness of breath, swelling, right lower extremity pain and redness.  Patient reports that she was seen at urgent care yesterday and diagnosed with cellulitis, started on antibiotics.  She reports despite taking these she has become increasingly ill feeling incredibly fatigued and short of breath and has had increasing swelling and pain in her left leg.  Her family member reports she wears oxygen at all times, oxygen levels have been okay but she seems to be working harder to breathe and just seems a lot worse over the past 2 days.  Family unsure of fevers, reports an occasional cough with shortness of breath, no abdominal pain, nausea or vomiting but has had some intermittent diarrhea.  The history is provided by the patient, medical records and a relative.  Shortness of Breath Associated symptoms: cough   Associated symptoms: no abdominal pain, no chest pain, no fever and no vomiting        Home Medications Prior to Admission medications   Medication Sig Start Date End Date Taking? Authorizing Provider  acetaminophen (TYLENOL) 325 MG tablet Take 650 mg by mouth every 6 (six) hours as needed for moderate pain or headache. Patient used this medication for her headache.    [provider]  allopurinol (ZYLOPRIM) 100 MG tablet Take 100 mg by mouth daily. 12/31/21   [provider]  atorvastatin (LIPITOR) 40 MG tablet Take 40 mg by mouth in the morning. 01/19/22   [provider]  colchicine 0.6 MG tablet Take 0.6 mg by mouth daily as needed (gout).     [provider]  cyanocobalamin (VITAMIN B12) 1000 MCG tablet Take 2,000 mcg by mouth daily.    [provider]  EPINEPHrine 0.3 mg/0.3 mL IJ SOAJ injection Inject 0.3 mg into the muscle as needed for anaphylaxis. 08/07/19   [provider]  famotidine (PEPCID) 20 MG tablet Take 1 tablet by mouth daily. 06/08/22   [provider]  folic acid (FOLVITE) 1 MG tablet Take 1 tablet (1 mg total) by mouth daily. 06/16/22   Calvert Cantor, MD  furosemide (LASIX) 40 MG tablet Take 1 tablet (40 mg total) by mouth daily. 05/22/22   Leatha Gilding, MD  HYDROcodone-acetaminophen (NORCO) 10-325 MG tablet Take 1 tablet by mouth every 6 (six) hours as needed for moderate pain or severe pain. 02/25/22   [provider]  hydrOXYzine (ATARAX/VISTARIL) 25 MG tablet Take 25 mg by mouth 3 (three) times daily as needed for itching.    [provider]  IRON-VITAMIN C PO Take 1 tablet by mouth daily.    [provider]  lactulose (CHRONULAC) 10 GM/15ML solution Take 15 mLs (10 g total) by mouth 2 (two) times daily. Patient taking differently: Take 10 g by mouth 2 (two) times daily as needed for mild constipation or moderate constipation. 05/10/22   Osvaldo Shipper, MD  levothyroxine (SYNTHROID) 150 MCG tablet Take 150 mcg by mouth daily before breakfast. 10/26/21   [provider]  magnesium gluconate (MAGONATE) 500 MG tablet Take 1,000 mg by  mouth daily.    [provider]  methocarbamol (ROBAXIN) 500 MG tablet Take 500 mg by mouth daily. 03/04/22   [provider]  metoprolol succinate (TOPROL-XL) 25 MG 24 hr tablet Take 12.5 mg by mouth daily as needed (afib).    [provider]  midodrine (PROAMATINE) 5 MG tablet Take 5 mg by mouth daily as needed (low blood pressure).    [provider]  naloxone [facility]) nasal spray 4 mg/0.1 mL Place 1 spray into the nose as needed (accidental overdose). 03/25/22   [provider]  neomycin-polymyxin b-dexamethasone (MAXITROL) 3.5-10000-0.1 OINT Place 1 Application into the left eye 3 (three) times daily.    [provider]  nystatin cream (MYCOSTATIN) Apply topically 2 (two) times daily. Patient taking differently: Apply 1 Application topically 2 (two) times daily as needed for dry skin. 05/22/22   Leatha Gilding, MD  pantoprazole (PROTONIX) 40 MG tablet Take 1 tablet (40 mg total) by mouth daily. 05/10/22   Osvaldo Shipper, MD  promethazine (PHENERGAN) 12.5 MG tablet Take 12.5 mg by mouth every 6 (six) hours as needed for nausea or vomiting.    [provider]  rifaximin (XIFAXAN) 550 MG TABS tablet Take 550 mg by mouth 2 (two) times daily.    [provider]  spironolactone (ALDACTONE) 25 MG tablet Take 0.5 tablets (12.5 mg total) by mouth daily. Patient taking differently: Take 25 mg by mouth daily. 05/22/22   Leatha Gilding, MD  triamcinolone cream (KENALOG) 0.5 % Apply to affected area of right breast twice daily as directed. Patient taking differently: Apply 1 Application topically 2 (two) times daily as needed (rash). 05/10/22   Osvaldo Shipper, MD  venlafaxine XR (EFFEXOR-XR) 75 MG 24 hr capsule Take 75 mg by mouth daily with breakfast. 08/17/21   [provider]  Vitamin D, Ergocalciferol, (DRISDOL) 50000 UNITS CAPS Take 50,000 Units by mouth every 7 (seven) days.    [provider]      Allergies    Ivp dye [iodinated contrast media], Ace inhibitors, Chlorhexidine, Doxycycline, Penicillins, and Zofran [ondansetron]    Review of Systems   Review of Systems  Constitutional:  Positive for fatigue. Negative for chills and fever.  HENT: Negative.    Respiratory:  Positive for cough and shortness of breath.   Cardiovascular:  Positive for leg swelling. Negative for chest pain.  Gastrointestinal:  Positive for diarrhea. Negative for abdominal pain, nausea and vomiting.  Musculoskeletal:  Positive for arthralgias and  myalgias.  Skin:  Positive for color change.  All other systems reviewed and are negative.   Physical Exam Updated Vital Signs BP (!) 85/72 (BP Location: Left Arm)   Pulse (!) 107   Temp 98.5 F (36.9 C) (Oral)   Resp 20   Ht 5\' 2"  (1.575 m)   Wt 106.6 kg   SpO2 97%   BMI 42.98 kg/m  Physical Exam Vitals and nursing note reviewed.  Constitutional:      General: She is in acute distress.     Appearance: Normal appearance. She is well-developed. She is obese. She is ill-appearing and diaphoretic.     Comments: Patient is alert but acutely ill-appearing and diaphoretic.  HENT:     Head: Normocephalic and atraumatic.  Eyes:     General:        Right eye: No discharge.        Left eye: No discharge.     Pupils: Pupils are equal, round, and reactive  to light.  Cardiovascular:     Rate and Rhythm: Regular rhythm. Tachycardia present.     Pulses: Normal pulses.     Heart sounds: Normal heart sounds.  Pulmonary:     Effort: Pulmonary effort is normal. Tachypnea present. No respiratory distress.     Breath sounds: Decreased breath sounds present. No wheezing or rales.     Comments: Respirations equal and unlabored, patient able to speak in full sentences, lungs clear to auscultation bilaterally decreased breath sounds without wheezing, rales or rhonchi Chest:     Chest wall: No tenderness.  Abdominal:     General: Bowel sounds are normal. There is no distension.     Palpations: Abdomen is soft. There is no mass.     Tenderness: There is no abdominal tenderness. There is no guarding.     Comments: Abdomen soft, nondistended, nontender to palpation in all quadrants without guarding or peritoneal signs  Musculoskeletal:        General: No deformity.     Cervical back: Neck supple.     Right lower leg: Edema present.     Left lower leg: Edema present.     Comments: Left lower extremity is diffusely edematous with some erythema extending from the hip down to the mid thigh, it is  exquisitely tender to even light palpation out of proportion to exam without palpable crepitus, no focal bony tenderness, distal pulses faint but equal bilaterally, likely due to edema, confirmed with Doppler  Skin:    General: Skin is warm.     Capillary Refill: Capillary refill takes less than 2 seconds.  Neurological:     Mental Status: She is alert and oriented to person, place, and time.     Coordination: Coordination normal.     Comments: Speech is clear, able to follow commands Moves extremities without ataxia, coordination intact  Psychiatric:        Mood and Affect: Mood normal.        Behavior: Behavior normal.     ED Results / Procedures / Treatments   Labs (all labs ordered are listed, but only abnormal results are displayed) Labs Reviewed  CULTURE, BLOOD (ROUTINE X 2)   COMPREHENSIVE METABOLIC PANEL - Abnormal; Notable for the following components:   Sodium 130 (*)    CO2 13 (*)    Glucose, Bld 108 (*)    BUN 37 (*)    Creatinine, Ser 2.41 (*)    Calcium 7.8 (*)    Total Protein 5.0 (*)    Albumin 1.9 (*)    Total Bilirubin 2.2 (*)    GFR, Estimated 22 (*)    All other components within normal limits  CBC WITH DIFFERENTIAL/PLATELET - Abnormal; Notable for the following components:   RBC 2.47 (*)    Hemoglobin 7.5 (*)    HCT 24.6 (*)    RDW 17.1 (*)    Platelets 96 (*)    Lymphs Abs 0.3 (*)    All other components within normal limits  PROTIME-INR - Abnormal; Notable for the following components:   Prothrombin Time 34.2 (*)    INR 3.4 (*)    All other components within normal limits  APTT - Abnormal; Notable for the following components:   aPTT 49 (*)    All other components within normal limits  URINALYSIS, W/ REFLEX TO CULTURE (INFECTION SUSPECTED) - Abnormal; Notable for the following components:   Color, Urine AMBER (*)    APPearance CLOUDY (*)    Protein,  ur 30 (*)    Leukocytes,Ua TRACE (*)    Bacteria, UA MANY (*)    All other components within  normal limits  MAGNESIUM - Abnormal; Notable for the following components:   Magnesium 1.4 (*)    All other components within normal limits  GLUCOSE, CAPILLARY - Abnormal; Notable for the following components:   Glucose-Capillary 108 (*)    All other components within normal limits  I-STAT CG4 LACTIC ACID, ED - Abnormal; Notable for the following components:   Lactic Acid, Venous 6.1 (*)    All other components within normal limits  I-STAT CG4 LACTIC ACID, ED - Abnormal; Notable for the following components:   Lactic Acid, Venous 4.2 (*)    All other components within normal limits  CBG MONITORING, ED - Abnormal; Notable for the following components:   Glucose-Capillary 59 (*)    All other components within normal limits  RESP PANEL BY RT-PCR (RSV, FLU A&B, COVID)  [id number]    EKG EKG Interpretation Date/Time:  Thursday August 11 2022 07:14:05 EDT Ventricular Rate:  106 PR Interval:  147 QRS Duration:  80 QT Interval:  337 QTC Calculation: 448 R Axis:   12  Text Interpretation: Sinus tachycardia Atrial premature complex Low voltage, precordial leads Abnormal R-wave progression, early transition Atrial premature complexes Confirmed by Gerhard Munch [phone]) on 08/12/2022 10:51:20 PM   Radiology CT CHEST ABDOMEN PELVIS WO CONTRAST  Result Date: 08/11/2022 CLINICAL DATA:  Sepsis, left leg cellulitis EXAM: CT CHEST, ABDOMEN AND PELVIS WITHOUT CONTRAST CT LEFT FEMUR WITHOUT CONTRAST CT LEFT TIBIA AND FIBULA WITHOUT CONTRAST TECHNIQUE: Multidetector CT imaging of the chest, abdomen and pelvis was performed following the standard protocol without IV contrast. RADIATION DOSE REDUCTION: This exam was performed according to the departmental dose-optimization program which includes automated exposure control, adjustment of the mA and/or kV according to patient size and/or use of iterative reconstruction technique. COMPARISON:  04/30/2022 FINDINGS: CT CHEST FINDINGS Cardiovascular: Aortic  atherosclerosis. Aortic valve calcifications. Normal heart size. Three-vessel coronary artery calcifications. No pericardial effusion. Mediastinum/Nodes: No enlarged mediastinal, hilar, or axillary lymph nodes. Thyroid gland, trachea, and esophagus demonstrate no significant findings. Lungs/Pleura: Trace left pleural effusion. Bandlike scarring or atelectasis of the lung bases. No pleural effusion or pneumothorax. Musculoskeletal: No chest wall abnormality. New, although age indeterminate mild superior endplate wedge deformity of T5 with less than 25% anterior height loss (series 12, image 126). CT ABDOMEN PELVIS FINDINGS Hepatobiliary: Coarse, nodular cirrhotic morphology of the liver. No obvious liver lesion on noncontrast CT. Status post cholecystectomy. No biliary ductal dilatation. Pancreas: Unremarkable. No pancreatic ductal dilatation or surrounding inflammatory changes. Spleen: Mild splenomegaly, maximum span 14.3 cm. Adrenals/Urinary Tract: Adrenal glands are unremarkable. Kidneys are normal, without renal calculi, solid lesion, or hydronephrosis. Bladder is unremarkable. Stomach/Bowel: Stomach is within normal limits. Status post partial right hemicolectomy and ileocolic reanastomosis. No evidence of bowel wall thickening, distention, or inflammatory changes. Vascular/Lymphatic: Aortic atherosclerosis. Large retroperitoneal and mesenteric varices (series 4, image 75, 67). No enlarged abdominal or pelvic lymph nodes. Reproductive: Calcified uterine fibroids. Other: Small, fat and fluid containing low midline ventral hernia (series 4, image 97). Severe anasarca. Small volume ascites throughout the abdomen and pelvis. Musculoskeletal: No acute osseous findings. CT LEFT LOWER EXTREMITY FINDINGS Soft tissues: Severe, diffuse, circumferential anasarca throughout the left lower extremity. To the extent that the right lower extremity is included for comparison, this appears symmetric. No focal fluid collection.  Musculoskeletal: Sarcopenia.  No acute findings. Osseous structures and joints: No  fracture or dislocation. Tricompartmental arthrosis of the left knee. No knee joint effusion. Vascular: Severe, diffuse atherosclerosis and vascular calcinosis. Superficial, calcified varices throughout the thigh and calf. IMPRESSION: 1. Severe, diffuse, circumferential anasarca throughout the left lower extremity. To the extent that the right lower extremity is included for comparison, this appears symmetric. No focal fluid collection. 2. Cirrhosis with evidence of portal hypertension including mild splenomegaly, large retroperitoneal and mesenteric varices, and small volume ascites throughout the abdomen and pelvis. 3. New, although age indeterminate mild superior endplate wedge deformity of T5 with less than 25% anterior height loss. Correlate for point tenderness. 4. Coronary artery disease. Aortic Atherosclerosis (ICD10-I70.0). Electronically Signed   By: Jearld Lesch M.D.   On: 08/11/2022 09:28   CT FEMUR LEFT WO CONTRAST  Result Date: 08/11/2022 CLINICAL DATA:  Sepsis, left leg cellulitis EXAM: CT CHEST, ABDOMEN AND PELVIS WITHOUT CONTRAST CT LEFT FEMUR WITHOUT CONTRAST CT LEFT TIBIA AND FIBULA WITHOUT CONTRAST TECHNIQUE: Multidetector CT imaging of the chest, abdomen and pelvis was performed following the standard protocol without IV contrast. RADIATION DOSE REDUCTION: This exam was performed according to the departmental dose-optimization program which includes automated exposure control, adjustment of the mA and/or kV according to patient size and/or use of iterative reconstruction technique. COMPARISON:  04/30/2022 FINDINGS: CT CHEST FINDINGS Cardiovascular: Aortic atherosclerosis. Aortic valve calcifications. Normal heart size. Three-vessel coronary artery calcifications. No pericardial effusion. Mediastinum/Nodes: No enlarged mediastinal, hilar, or axillary lymph nodes. Thyroid gland, trachea, and esophagus  demonstrate no significant findings. Lungs/Pleura: Trace left pleural effusion. Bandlike scarring or atelectasis of the lung bases. No pleural effusion or pneumothorax. Musculoskeletal: No chest wall abnormality. New, although age indeterminate mild superior endplate wedge deformity of T5 with less than 25% anterior height loss (series 12, image 126). CT ABDOMEN PELVIS FINDINGS Hepatobiliary: Coarse, nodular cirrhotic morphology of the liver. No obvious liver lesion on noncontrast CT. Status post cholecystectomy. No biliary ductal dilatation. Pancreas: Unremarkable. No pancreatic ductal dilatation or surrounding inflammatory changes. Spleen: Mild splenomegaly, maximum span 14.3 cm. Adrenals/Urinary Tract: Adrenal glands are unremarkable. Kidneys are normal, without renal calculi, solid lesion, or hydronephrosis. Bladder is unremarkable. Stomach/Bowel: Stomach is within normal limits. Status post partial right hemicolectomy and ileocolic reanastomosis. No evidence of bowel wall thickening, distention, or inflammatory changes. Vascular/Lymphatic: Aortic atherosclerosis. Large retroperitoneal and mesenteric varices (series 4, image 75, 67). No enlarged abdominal or pelvic lymph nodes. Reproductive: Calcified uterine fibroids. Other: Small, fat and fluid containing low midline ventral hernia (series 4, image 97). Severe anasarca. Small volume ascites throughout the abdomen and pelvis. Musculoskeletal: No acute osseous findings. CT LEFT LOWER EXTREMITY FINDINGS Soft tissues: Severe, diffuse, circumferential anasarca throughout the left lower extremity. To the extent that the right lower extremity is included for comparison, this appears symmetric. No focal fluid collection. Musculoskeletal: Sarcopenia.  No acute findings. Osseous structures and joints: No fracture or dislocation. Tricompartmental arthrosis of the left knee. No knee joint effusion. Vascular: Severe, diffuse atherosclerosis and vascular calcinosis.  Superficial, calcified varices throughout the thigh and calf. IMPRESSION: 1. Severe, diffuse, circumferential anasarca throughout the left lower extremity. To the extent that the right lower extremity is included for comparison, this appears symmetric. No focal fluid collection. 2. Cirrhosis with evidence of portal hypertension including mild splenomegaly, large retroperitoneal and mesenteric varices, and small volume ascites throughout the abdomen and pelvis. 3. New, although age indeterminate mild superior endplate wedge deformity of T5 with less than 25% anterior height loss. Correlate for point tenderness. 4. Coronary artery disease. Aortic  Atherosclerosis (ICD10-I70.0). Electronically Signed   By: Jearld Lesch M.D.   On: 08/11/2022 09:28   CT TIBIA FIBULA LEFT WO CONTRAST  Result Date: 08/11/2022 CLINICAL DATA:  Sepsis, left leg cellulitis EXAM: CT CHEST, ABDOMEN AND PELVIS WITHOUT CONTRAST CT LEFT FEMUR WITHOUT CONTRAST CT LEFT TIBIA AND FIBULA WITHOUT CONTRAST TECHNIQUE: Multidetector CT imaging of the chest, abdomen and pelvis was performed following the standard protocol without IV contrast. RADIATION DOSE REDUCTION: This exam was performed according to the departmental dose-optimization program which includes automated exposure control, adjustment of the mA and/or kV according to patient size and/or use of iterative reconstruction technique. COMPARISON:  04/30/2022 FINDINGS: CT CHEST FINDINGS Cardiovascular: Aortic atherosclerosis. Aortic valve calcifications. Normal heart size. Three-vessel coronary artery calcifications. No pericardial effusion. Mediastinum/Nodes: No enlarged mediastinal, hilar, or axillary lymph nodes. Thyroid gland, trachea, and esophagus demonstrate no significant findings. Lungs/Pleura: Trace left pleural effusion. Bandlike scarring or atelectasis of the lung bases. No pleural effusion or pneumothorax. Musculoskeletal: No chest wall abnormality. New, although age indeterminate  mild superior endplate wedge deformity of T5 with less than 25% anterior height loss (series 12, image 126). CT ABDOMEN PELVIS FINDINGS Hepatobiliary: Coarse, nodular cirrhotic morphology of the liver. No obvious liver lesion on noncontrast CT. Status post cholecystectomy. No biliary ductal dilatation. Pancreas: Unremarkable. No pancreatic ductal dilatation or surrounding inflammatory changes. Spleen: Mild splenomegaly, maximum span 14.3 cm. Adrenals/Urinary Tract: Adrenal glands are unremarkable. Kidneys are normal, without renal calculi, solid lesion, or hydronephrosis. Bladder is unremarkable. Stomach/Bowel: Stomach is within normal limits. Status post partial right hemicolectomy and ileocolic reanastomosis. No evidence of bowel wall thickening, distention, or inflammatory changes. Vascular/Lymphatic: Aortic atherosclerosis. Large retroperitoneal and mesenteric varices (series 4, image 75, 67). No enlarged abdominal or pelvic lymph nodes. Reproductive: Calcified uterine fibroids. Other: Small, fat and fluid containing low midline ventral hernia (series 4, image 97). Severe anasarca. Small volume ascites throughout the abdomen and pelvis. Musculoskeletal: No acute osseous findings. CT LEFT LOWER EXTREMITY FINDINGS Soft tissues: Severe, diffuse, circumferential anasarca throughout the left lower extremity. To the extent that the right lower extremity is included for comparison, this appears symmetric. No focal fluid collection. Musculoskeletal: Sarcopenia.  No acute findings. Osseous structures and joints: No fracture or dislocation. Tricompartmental arthrosis of the left knee. No knee joint effusion. Vascular: Severe, diffuse atherosclerosis and vascular calcinosis. Superficial, calcified varices throughout the thigh and calf. IMPRESSION: 1. Severe, diffuse, circumferential anasarca throughout the left lower extremity. To the extent that the right lower extremity is included for comparison, this appears symmetric.  No focal fluid collection. 2. Cirrhosis with evidence of portal hypertension including mild splenomegaly, large retroperitoneal and mesenteric varices, and small volume ascites throughout the abdomen and pelvis. 3. New, although age indeterminate mild superior endplate wedge deformity of T5 with less than 25% anterior height loss. Correlate for point tenderness. 4. Coronary artery disease. Aortic Atherosclerosis (ICD10-I70.0). Electronically Signed   By: Jearld Lesch M.D.   On: 08/11/2022 09:28   DG Chest Port 1 View  Result Date: 08/11/2022 CLINICAL DATA:  Questionable sepsis. Fatigue and shortness of breath. EXAM: PORTABLE CHEST 1 VIEW COMPARISON:  07/29/2022 FINDINGS: Stable cardiac enlargement. Low lung volumes. Scarring is noted within the left midlung. No pleural fluid, interstitial edema or airspace disease. The visualized skeletal structures are unremarkable. IMPRESSION: Low lung volumes. No active disease. Electronically Signed   By: Signa Kell M.D.   On: 08/11/2022 07:12     Procedures .Critical Care  Performed by: Dartha Lodge, PA-C Authorized  by: Dartha Lodge, PA-C   Critical care provider statement:    Critical care time (minutes):  45   Critical care was necessary to treat or prevent imminent or life-threatening deterioration of the following conditions:  Sepsis, shock and circulatory failure (Hypotension due to hypovolemia from 3rd spacing and potential sepsis from cellulitis)   Critical care was time spent personally by me on the following activities:  Development of treatment plan with patient or surrogate, discussions with consultants, evaluation of patient's response to treatment, examination of patient, ordering and review of laboratory studies, ordering and review of radiographic studies, ordering and performing treatments and interventions, pulse oximetry, re-evaluation of patient's condition and review of old charts     Medications Ordered in ED Medications - No  data to display  ED Course/ Medical Decision Making/ A&P                                 Medical Decision Making Amount and/or Complexity of Data Reviewed Labs: ordered. Radiology: ordered. ECG/medicine tests: ordered.  Risk Prescription drug management. Decision regarding hospitalization.   67 y.o. female presents to the ED with complaints of shortness of breath, fatigue, left lower leg pain, redness and swelling, this involves an extensive number of treatment options, and is a complaint that carries with it a high risk of complications and morbidity.  On arrival patient is acutely ill-appearing, hypotensive, tachycardic and tachypneic although afebrile, code sepsis initiated, patient given broad-spectrum antibiotics with specific coverage for necrotizing fasciitis, given 1 L fluid bolus, given patient's edema already present and concern for third spacing cautious to give aggressive fluid rehydration.  Differential includes infection due to cellulitis, necrotizing fasciitis, UTI, pneumonia.  Given patient's underlying cirrhosis could also have an element of fluid overload and third spacing leading to hypotension, and severe case could also have hepatorenal or hepatocardiorenal syndrome.   Additional history obtained from family member at bedside. Previous records obtained and reviewed    Lab Tests:  I Ordered, reviewed, and interpreted labs, which included: Lactic acid of 6.1, although underlying liver disease could impact this, no leukocytosis, hemoglobin of 8.4 which is at baseline for patient, slight worsening of creatinine at 1.35 and mild hypokalemia but no other significant electrolyte derangements, INR of 3.4 in the setting of chronic liver disease.  Urinalysis with many bacteria present, culture pending  Imaging Studies ordered:  I ordered imaging studies which included chest x-ray, CT of the left lower extremity and CT of the chest abdomen pelvis, I independently visualized and  interpreted imaging which showed no obvious pneumonia on chest x-ray, fortunately CTs of the left lower extremity without gas to suggest necrotizing fasciitis.  Severe diffuse circumferential anasarca present, but no abscess noted.  Evidence of cirrhosis noted on abdominal CT.  No other acute findings.  ED Course:   Despite fluid resuscitation patient remains hypotensive with systolic blood pressures in the 80s and elevated lactic acid.  Suspect this is multifactorial potentially from sepsis due to cellulitis versus urinary tract infection, blood cultures are pending, also suspect degree of third spacing in the setting of liver disease.  Patient not responding to fluid resuscitation, will start IV pressors, Levophed ordered.  Consult placed to intensivist for admission to the ICU for further management  Case discussed with NP Selmer Dominion with critical care team who will see and admit the patient   Portions of this note were generated with Dragon dictation  software. Dictation errors may occur despite best attempts at proofreading.         Final Clinical Impression(s) / ED Diagnoses Final diagnoses:  Hypotension, unspecified hypotension type    Rx / DC Orders ED Discharge Orders     None         Legrand Rams 09/07/22 1558    Vanetta Mulders, MD 09/08/22 [phone]

## 2022-08-11 NOTE — Progress Notes (Signed)
Pharmacy Antibiotic Note  [name] is a 67 y.o. female admitted on 08/11/2022 with concern for sepsis, LLE cellulitis.  Pharmacy has been consulted for vancomycin and cefepime dosing.  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  Today, 08/11/2022: - Afebrile, WBC WNL, hypotensive - PCT 16.2 (unreliable in CKD) - CXR: no active disease - CT: LLE: Severe, diffuse, circumferential anasarca   Plan: Cefepime 2g IV q12h  Vancomycin 2g IV x1 then 1250 mg IV q48h (SCr 2.41, Vd 0.5, est AUC 516) Measure Vanc levels as needed.  Goal AUC = 400 - 550  Follow up renal function, culture results, and clinical course.   Height: 5\' 2"  (157.5 cm) Weight: 121.8 kg (268 lb 8.3 oz) IBW/kg (Calculated) : 50.1  Temp (24hrs), Avg:98.2 F (36.8 C), Min:98 F (36.7 C), Max:98.5 F (36.9 C)  Recent Labs  Lab 08/11/22 0730 08/11/22 0739 08/11/22 0939  WBC 6.2  --   --   CREATININE 2.41*  --   --   LATICACIDVEN  --  6.1* 4.2*    Estimated Creatinine Clearance: 29.3 mL/min (A) (by C-G formula based on SCr of 2.41 mg/dL (H)).    Allergies  Allergen Reactions   Ivp Dye [Iodinated Contrast Media] Anaphylaxis   Ace Inhibitors Swelling   Chlorhexidine Rash    Pt has a rash, & made irritation skin further   Doxycycline Rash   Penicillins Rash   Zofran [Ondansetron] Rash    Antimicrobials this admission: 8/1 Meropenem 8/1 Vancomycin >>  8/1 Clinda x1 8/1 Azithromycin >> (8/6) 8/1 cefepime >>   Dose adjustments this admission:   Microbiology results: 8/1 Resp panel: covid, flu, rsv: negative 8/1 BCx:  8/1 UA: many bacteria, 21-50 epithelial cells, 21-50 WBC 8/1 UCx:  8/1 MRSA PCR:     Thank you for allowing pharmacy to be a part of this patient's  care.  Lynann Beaver PharmD, BCPS [facility] main pharmacy [phone] 08/11/2022 1:54 PM

## 2022-08-11 NOTE — ED Notes (Signed)
ED TO INPATIENT HANDOFF REPORT  Name/Age/Gender [name] 67 y.o. female  Code Status    Code Status Orders  (From admission, onward)           Start     Ordered   08/11/22 1108  Do not attempt resuscitation (DNR)  Continuous       Question Answer Comment  If patient has no pulse and is not breathing Do Not Attempt Resuscitation   If patient has a pulse and/or is breathing: Medical Treatment Goals LIMITED ADDITIONAL INTERVENTIONS: Use medication/IV fluids and cardiac monitoring as indicated; Do not use intubation or mechanical ventilation (DNI), also provide comfort medications.  Transfer to Progressive/Stepdown as indicated, avoid Intensive Care.   Consent: Discussion documented in EHR or advanced directives reviewed      08/11/22 1109           Code Status History     Date Active Date Inactive Code Status Order ID Comments User Context   06/12/2022 2222 06/15/2022 1908 Full Code [id number]  Orland Mustard, MD Inpatient   05/16/2022 2308 05/22/2022 1922 Full Code [id number]  Rometta Emery, MD ED   05/01/2022 0022 05/10/2022 2002 Full Code [id number]  Anselm Jungling, DO ED       Home/SNF/Other Home  Chief Complaint Hypotension [I95.9]  Level of Care/Admitting Diagnosis ED Disposition     ED Disposition  Admit   Condition  --   Comment  Hospital Area: [facility] [[id number]]  Level of Care: ICU [6]  May admit patient to [facility] or [facility] if equivalent level of care is available:: Yes  Covid Evaluation: Confirmed COVID Negative  Diagnosis: Hypotension [086578]  Admitting Physician: Luciano Cutter [[id number]]  Attending Physician: Luciano Cutter [phone]  Certification:: I certify this patient will need inpatient services for at least 2 midnights  Estimated Length of Stay: 4          Medical History Past Medical History:  Diagnosis Date   Chronic diarrhea    Cirrhosis, 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   pt stated on  admission history review   Heart murmur    Hypertension    Kidney stone    Obesity    Pulmonary embolism (HCC)    Short gut syndrome    Thyroid disease     Allergies Allergies  Allergen Reactions   Ivp Dye [Iodinated Contrast Media] Anaphylaxis   Ace Inhibitors Swelling   Chlorhexidine     Pt has a rash, & made irritation skin further   Doxycycline Rash   Penicillins Rash   Zofran [Ondansetron] Rash    IV Location/Drains/Wounds Patient Lines/Drains/Airways Status     Active Line/Drains/Airways     Name Placement date Placement time Site Days   Peripheral IV 08/11/22 20 G 1" Right Antecubital 08/11/22  0737  Antecubital  less than 1   Peripheral IV 08/11/22 22 G 1" Posterior;Right Forearm 08/11/22  1224  Forearm  less than 1   Wound / Incision (Open or Dehisced) 05/01/22 Skin tear Buttocks Bilateral;Medial Patient was itching herself, caused skin tear 05/01/22  1600  Buttocks  102   Wound / Incision (Open or Dehisced) 05/02/22 (ITD) Intertriginous Dermatitis Breast Left;Lower 05/02/22  0200  Breast  101   Wound / Incision (Open or Dehisced) 05/02/22 (ITD) Intertriginous Dermatitis Groin Bilateral 05/02/22  0200  Groin  101   Wound / Incision (Open or Dehisced) 05/02/22 Irritant Dermatitis (Moisture Associated Skin Damage) Buttocks Bilateral 05/02/22  0200  Buttocks  101   Wound / Incision (Open or Dehisced) 05/02/22 (ITD) Intertriginous Dermatitis Knee Posterior;Bilateral 05/02/22  0200  Knee  101            Labs/Imaging Results for orders placed or performed during the hospital encounter of 08/11/22 (from the past 48 hour(s))  Resp panel by RT-PCR (RSV, Flu A&B, Covid) Anterior Nasal Swab     Status: None   Collection Time: 08/11/22  7:30 AM   Specimen: Anterior Nasal Swab  Result Value Ref Range   SARS Coronavirus 2 by RT PCR NEGATIVE NEGATIVE    Comment: (NOTE) SARS-CoV-2 target nucleic acids are NOT DETECTED.  The SARS-CoV-2 RNA is generally detectable in upper  respiratory specimens during the acute phase of infection. The lowest concentration of SARS-CoV-2 viral copies this assay can detect is 138 copies/mL. A negative result does not preclude SARS-Cov-2 infection and should not be used as the sole basis for treatment or other patient management decisions. A negative result may occur with  improper specimen collection/handling, submission of specimen other than nasopharyngeal swab, presence of viral mutation(s) within the areas targeted by this assay, and inadequate number of viral copies(<138 copies/mL). A negative result must be combined with clinical observations, patient history, and epidemiological information. The expected result is Negative.  Fact Sheet for Patients:  BloggerCourse.com  Fact Sheet for Healthcare Providers:  SeriousBroker.it  This test is no t yet approved or cleared by the [place] FDA and  has been authorized for detection and/or diagnosis of SARS-CoV-2 by FDA under an Emergency Use Authorization (EUA). This EUA will remain  in effect (meaning this test can be used) for the duration of the COVID-19 declaration under Section 564(b)(1) of the Act, 21 U.S.C.section 360bbb-3(b)(1), unless the authorization is terminated  or revoked sooner.       Influenza A by PCR NEGATIVE NEGATIVE   Influenza B by PCR NEGATIVE NEGATIVE    Comment: (NOTE) The Xpert Xpress SARS-CoV-2/FLU/RSV plus assay is intended as an aid in the diagnosis of influenza from Nasopharyngeal swab specimens and should not be used as a sole basis for treatment. Nasal washings and aspirates are unacceptable for Xpert Xpress SARS-CoV-2/FLU/RSV testing.  Fact Sheet for Patients: BloggerCourse.com  Fact Sheet for Healthcare Providers: SeriousBroker.it  This test is not yet approved or cleared by the [place] FDA and has been authorized for  detection and/or diagnosis of SARS-CoV-2 by FDA under an Emergency Use Authorization (EUA). This EUA will remain in effect (meaning this test can be used) for the duration of the COVID-19 declaration under Section 564(b)(1) of the Act, 21 U.S.C. section 360bbb-3(b)(1), unless the authorization is terminated or revoked.     Resp Syncytial Virus by PCR NEGATIVE NEGATIVE    Comment: (NOTE) Fact Sheet for Patients: BloggerCourse.com  Fact Sheet for Healthcare Providers: SeriousBroker.it  This test is not yet approved or cleared by the [place] FDA and has been authorized for detection and/or diagnosis of SARS-CoV-2 by FDA under an Emergency Use Authorization (EUA). This EUA will remain in effect (meaning this test can be used) for the duration of the COVID-19 declaration under Section 564(b)(1) of the Act, 21 U.S.C. section 360bbb-3(b)(1), unless the authorization is terminated or revoked.  Performed at [facility], [address], [place], [place] [address]   Comprehensive metabolic panel     Status: Abnormal   Collection Time: 08/11/22  7:30 AM  Result Value Ref Range   Sodium 130 (L) 135 - 145 mmol/L  Potassium 3.7 3.5 - 5.1 mmol/L   Chloride 103 98 - 111 mmol/L   CO2 13 (L) 22 - 32 mmol/L   Glucose, Bld 108 (H) 70 - 99 mg/dL    Comment: Glucose reference range applies only to samples taken after fasting for at least 8 hours.   BUN 37 (H) 8 - 23 mg/dL   Creatinine, Ser 5.40 (H) 0.44 - 1.00 mg/dL   Calcium 7.8 (L) 8.9 - 10.3 mg/dL   Total Protein 5.0 (L) 6.5 - 8.1 g/dL   Albumin 1.9 (L) 3.5 - 5.0 g/dL   AST 32 15 - 41 U/L   ALT 28 0 - 44 U/L   Alkaline Phosphatase 98 38 - 126 U/L   Total Bilirubin 2.2 (H) 0.3 - 1.2 mg/dL   GFR, Estimated 22 (L) >60 mL/min    Comment: (NOTE) Calculated using the CKD-EPI Creatinine Equation (2021)    Anion gap 14 5 - 15    Comment: Performed at [facility], [address], [place], [place] [address]  CBC with Differential     Status: Abnormal   Collection Time: 08/11/22  7:30 AM  Result Value Ref Range   WBC 6.2 4.0 - 10.5 K/uL   RBC 2.47 (L) 3.87 - 5.11 MIL/uL   Hemoglobin 7.5 (L) 12.0 - 15.0 g/dL   HCT 14.7 (L) 82.9 - 56.2 %   MCV 99.6 80.0 - 100.0 fL   MCH 30.4 26.0 - 34.0 pg   MCHC 30.5 30.0 - 36.0 g/dL   RDW 13.0 (H) 86.5 - 78.4 %   Platelets 96 (L) 150 - 400 K/uL    Comment: SPECIMEN CHECKED FOR CLOTS Immature Platelet Fraction may be clinically indicated, consider ordering this additional test [id number] REPEATED TO VERIFY PLATELET COUNT CONFIRMED BY SMEAR    nRBC 0.0 0.0 - 0.2 %   Neutrophils Relative % 88 %   Neutro Abs 5.4 1.7 - 7.7 K/uL   Lymphocytes Relative 4 %   Lymphs Abs 0.3 (L) 0.7 - 4.0 K/uL   Monocytes Relative 5 %   Monocytes Absolute 0.3 0.1 - 1.0 K/uL   Eosinophils Relative 2 %   Eosinophils Absolute 0.1 0.0 - 0.5 K/uL   Basophils Relative 0 %   Basophils Absolute 0.0 0.0 - 0.1 K/uL   WBC Morphology DOHLE BODIES    Immature Granulocytes 1 %   Abs Immature Granulocytes 0.06 0.00 - 0.07 K/uL   Polychromasia PRESENT     Comment: Performed at [facility], [address], [place], [place] [address]  Protime-INR     Status: Abnormal   Collection Time: 08/11/22  7:30 AM  Result Value Ref Range   Prothrombin Time 34.2 (H) 11.4 - 15.2 seconds   INR 3.4 (H) 0.8 - 1.2    Comment: (NOTE) INR goal varies based on device and disease states. Performed at [facility], [address] [place], [place] [address]   APTT     Status: Abnormal   Collection Time: 08/11/22  7:30 AM  Result Value Ref Range   aPTT 49 (H) 24 - 36 seconds    Comment:        IF BASELINE aPTT IS ELEVATED, SUGGEST PATIENT RISK ASSESSMENT BE USED TO DETERMINE APPROPRIATE ANTICOAGULANT THERAPY. Performed at [facility], [address], [place], [place] [address]    Ammonia     Status: None   Collection Time: 08/11/22  7:30 AM  Result Value Ref  Range   Ammonia 24 9 - 35 umol/L    Comment: Performed at [facility], [address], [place], [place] [address]  Procalcitonin     Status: None   Collection Time: 08/11/22  7:30 AM  Result Value Ref Range   Procalcitonin 16.20 ng/mL    Comment:        Interpretation: PCT >= 10 ng/mL: Important systemic inflammatory response, almost exclusively due to severe bacterial sepsis or septic shock. (NOTE)       Sepsis PCT Algorithm           Lower Respiratory Tract                                      Infection PCT Algorithm    ----------------------------     ----------------------------         PCT < 0.25 ng/mL                PCT < 0.10 ng/mL          Strongly encourage             Strongly discourage   discontinuation of antibiotics    initiation of antibiotics    ----------------------------     -----------------------------       PCT 0.25 - 0.50 ng/mL            PCT 0.10 - 0.25 ng/mL               OR       >80% decrease in PCT            Discourage initiation of                                            antibiotics      Encourage discontinuation           of antibiotics    ----------------------------     -----------------------------         PCT >= 0.50 ng/mL              PCT 0.26 - 0.50 ng/mL                AND       <80% decrease in PCT             Encourage initiation of                                             antibiotics       Encourage continuation           of antibiotics    ----------------------------     -----------------------------        PCT >= 0.50 ng/mL                  PCT > 0.50 ng/mL               AND         increase in PCT                  Strongly encourage  initiation of antibiotics    Strongly encourage escalation           of antibiotics                                     -----------------------------                                            PCT <= 0.25 ng/mL                                                 OR                                        > 80% decrease in PCT                                      Discontinue / Do not initiate                                             antibiotics  Performed at [facility], [address], [place], [place] [address]   Magnesium     Status: Abnormal   Collection Time: 08/11/22  7:30 AM  Result Value Ref Range   Magnesium 1.4 (L) 1.7 - 2.4 mg/dL    Comment: Performed at [facility], [address] [place], [place] [address]  I-Stat Lactic Acid, ED     Status: Abnormal   Collection Time: 08/11/22  7:39 AM  Result Value Ref Range   Lactic Acid, Venous 6.1 (HH) 0.5 - 1.9 mmol/L   Comment NOTIFIED PHYSICIAN   Urinalysis, w/ Reflex to Culture (Infection Suspected) -Urine, Clean Catch     Status: Abnormal   Collection Time: 08/11/22  7:50 AM  Result Value Ref Range   Specimen Source URINE, CLEAN CATCH    Color, Urine AMBER (A) YELLOW    Comment: BIOCHEMICALS MAY BE AFFECTED BY COLOR   APPearance CLOUDY (A) CLEAR   Specific Gravity, Urine 1.018 1.005 - 1.030   pH 5.0 5.0 - 8.0   Glucose, UA NEGATIVE NEGATIVE mg/dL   Hgb urine dipstick NEGATIVE NEGATIVE   Bilirubin Urine NEGATIVE NEGATIVE   Ketones, ur NEGATIVE NEGATIVE mg/dL   Protein, ur 30 (A) NEGATIVE mg/dL   Nitrite NEGATIVE NEGATIVE   Leukocytes,Ua TRACE (A) NEGATIVE   RBC / HPF 6-10 0 - 5 RBC/hpf   WBC, UA 21-50 0 - 5 WBC/hpf    Comment:        Reflex urine culture not performed if WBC <=10, OR if Squamous epithelial cells >5. If Squamous epithelial cells >5 suggest recollection.    Bacteria, UA MANY (A) NONE SEEN   Squamous Epithelial / HPF 21-50 0 - 5 /HPF   WBC Clumps PRESENT    Mucus PRESENT    Hyaline Casts, UA PRESENT  Ca Oxalate Crys, UA PRESENT     Comment: Performed at [facility], [address]  [place], [place] [address]  I-Stat Lactic Acid, ED     Status: Abnormal   Collection Time: 08/11/22  9:39 AM  Result Value Ref Range   Lactic Acid, Venous 4.2 (HH) 0.5 - 1.9 mmol/L   Comment NOTIFIED PHYSICIAN   CBG monitoring, ED     Status: Abnormal   Collection Time: 08/11/22 12:27 PM  Result Value Ref Range   Glucose-Capillary 59 (L) 70 - 99 mg/dL    Comment: Glucose reference range applies only to samples taken after fasting for at least 8 hours.   CT CHEST ABDOMEN PELVIS WO CONTRAST  Result Date: 08/11/2022 CLINICAL DATA:  Sepsis, left leg cellulitis EXAM: CT CHEST, ABDOMEN AND PELVIS WITHOUT CONTRAST CT LEFT FEMUR WITHOUT CONTRAST CT LEFT TIBIA AND FIBULA WITHOUT CONTRAST TECHNIQUE: Multidetector CT imaging of the chest, abdomen and pelvis was performed following the standard protocol without IV contrast. RADIATION DOSE REDUCTION: This exam was performed according to the departmental dose-optimization program which includes automated exposure control, adjustment of the mA and/or kV according to patient size and/or use of iterative reconstruction technique. COMPARISON:  04/30/2022 FINDINGS: CT CHEST FINDINGS Cardiovascular: Aortic atherosclerosis. Aortic valve calcifications. Normal heart size. Three-vessel coronary artery calcifications. No pericardial effusion. Mediastinum/Nodes: No enlarged mediastinal, hilar, or axillary lymph nodes. Thyroid gland, trachea, and esophagus demonstrate no significant findings. Lungs/Pleura: Trace left pleural effusion. Bandlike scarring or atelectasis of the lung bases. No pleural effusion or pneumothorax. Musculoskeletal: No chest wall abnormality. New, although age indeterminate mild superior endplate wedge deformity of T5 with less than 25% anterior height loss (series 12, image 126). CT ABDOMEN PELVIS FINDINGS Hepatobiliary: Coarse, nodular cirrhotic morphology of the liver. No obvious liver lesion on noncontrast CT. Status post cholecystectomy. No biliary ductal  dilatation. Pancreas: Unremarkable. No pancreatic ductal dilatation or surrounding inflammatory changes. Spleen: Mild splenomegaly, maximum span 14.3 cm. Adrenals/Urinary Tract: Adrenal glands are unremarkable. Kidneys are normal, without renal calculi, solid lesion, or hydronephrosis. Bladder is unremarkable. Stomach/Bowel: Stomach is within normal limits. Status post partial right hemicolectomy and ileocolic reanastomosis. No evidence of bowel wall thickening, distention, or inflammatory changes. Vascular/Lymphatic: Aortic atherosclerosis. Large retroperitoneal and mesenteric varices (series 4, image 75, 67). No enlarged abdominal or pelvic lymph nodes. Reproductive: Calcified uterine fibroids. Other: Small, fat and fluid containing low midline ventral hernia (series 4, image 97). Severe anasarca. Small volume ascites throughout the abdomen and pelvis. Musculoskeletal: No acute osseous findings. CT LEFT LOWER EXTREMITY FINDINGS Soft tissues: Severe, diffuse, circumferential anasarca throughout the left lower extremity. To the extent that the right lower extremity is included for comparison, this appears symmetric. No focal fluid collection. Musculoskeletal: Sarcopenia.  No acute findings. Osseous structures and joints: No fracture or dislocation. Tricompartmental arthrosis of the left knee. No knee joint effusion. Vascular: Severe, diffuse atherosclerosis and vascular calcinosis. Superficial, calcified varices throughout the thigh and calf. IMPRESSION: 1. Severe, diffuse, circumferential anasarca throughout the left lower extremity. To the extent that the right lower extremity is included for comparison, this appears symmetric. No focal fluid collection. 2. Cirrhosis with evidence of portal hypertension including mild splenomegaly, large retroperitoneal and mesenteric varices, and small volume ascites throughout the abdomen and pelvis. 3. New, although age indeterminate mild superior endplate wedge deformity of T5  with less than 25% anterior height loss. Correlate for point tenderness. 4. Coronary artery disease. Aortic Atherosclerosis (ICD10-I70.0). Electronically Signed   By: Jearld Lesch  M.D.   On: 08/11/2022 09:28   CT FEMUR LEFT WO CONTRAST  Result Date: 08/11/2022 CLINICAL DATA:  Sepsis, left leg cellulitis EXAM: CT CHEST, ABDOMEN AND PELVIS WITHOUT CONTRAST CT LEFT FEMUR WITHOUT CONTRAST CT LEFT TIBIA AND FIBULA WITHOUT CONTRAST TECHNIQUE: Multidetector CT imaging of the chest, abdomen and pelvis was performed following the standard protocol without IV contrast. RADIATION DOSE REDUCTION: This exam was performed according to the departmental dose-optimization program which includes automated exposure control, adjustment of the mA and/or kV according to patient size and/or use of iterative reconstruction technique. COMPARISON:  04/30/2022 FINDINGS: CT CHEST FINDINGS Cardiovascular: Aortic atherosclerosis. Aortic valve calcifications. Normal heart size. Three-vessel coronary artery calcifications. No pericardial effusion. Mediastinum/Nodes: No enlarged mediastinal, hilar, or axillary lymph nodes. Thyroid gland, trachea, and esophagus demonstrate no significant findings. Lungs/Pleura: Trace left pleural effusion. Bandlike scarring or atelectasis of the lung bases. No pleural effusion or pneumothorax. Musculoskeletal: No chest wall abnormality. New, although age indeterminate mild superior endplate wedge deformity of T5 with less than 25% anterior height loss (series 12, image 126). CT ABDOMEN PELVIS FINDINGS Hepatobiliary: Coarse, nodular cirrhotic morphology of the liver. No obvious liver lesion on noncontrast CT. Status post cholecystectomy. No biliary ductal dilatation. Pancreas: Unremarkable. No pancreatic ductal dilatation or surrounding inflammatory changes. Spleen: Mild splenomegaly, maximum span 14.3 cm. Adrenals/Urinary Tract: Adrenal glands are unremarkable. Kidneys are normal, without renal calculi, solid  lesion, or hydronephrosis. Bladder is unremarkable. Stomach/Bowel: Stomach is within normal limits. Status post partial right hemicolectomy and ileocolic reanastomosis. No evidence of bowel wall thickening, distention, or inflammatory changes. Vascular/Lymphatic: Aortic atherosclerosis. Large retroperitoneal and mesenteric varices (series 4, image 75, 67). No enlarged abdominal or pelvic lymph nodes. Reproductive: Calcified uterine fibroids. Other: Small, fat and fluid containing low midline ventral hernia (series 4, image 97). Severe anasarca. Small volume ascites throughout the abdomen and pelvis. Musculoskeletal: No acute osseous findings. CT LEFT LOWER EXTREMITY FINDINGS Soft tissues: Severe, diffuse, circumferential anasarca throughout the left lower extremity. To the extent that the right lower extremity is included for comparison, this appears symmetric. No focal fluid collection. Musculoskeletal: Sarcopenia.  No acute findings. Osseous structures and joints: No fracture or dislocation. Tricompartmental arthrosis of the left knee. No knee joint effusion. Vascular: Severe, diffuse atherosclerosis and vascular calcinosis. Superficial, calcified varices throughout the thigh and calf. IMPRESSION: 1. Severe, diffuse, circumferential anasarca throughout the left lower extremity. To the extent that the right lower extremity is included for comparison, this appears symmetric. No focal fluid collection. 2. Cirrhosis with evidence of portal hypertension including mild splenomegaly, large retroperitoneal and mesenteric varices, and small volume ascites throughout the abdomen and pelvis. 3. New, although age indeterminate mild superior endplate wedge deformity of T5 with less than 25% anterior height loss. Correlate for point tenderness. 4. Coronary artery disease. Aortic Atherosclerosis (ICD10-I70.0). Electronically Signed   By: Jearld Lesch M.D.   On: 08/11/2022 09:28   CT TIBIA FIBULA LEFT WO CONTRAST  Result  Date: 08/11/2022 CLINICAL DATA:  Sepsis, left leg cellulitis EXAM: CT CHEST, ABDOMEN AND PELVIS WITHOUT CONTRAST CT LEFT FEMUR WITHOUT CONTRAST CT LEFT TIBIA AND FIBULA WITHOUT CONTRAST TECHNIQUE: Multidetector CT imaging of the chest, abdomen and pelvis was performed following the standard protocol without IV contrast. RADIATION DOSE REDUCTION: This exam was performed according to the departmental dose-optimization program which includes automated exposure control, adjustment of the mA and/or kV according to patient size and/or use of iterative reconstruction technique. COMPARISON:  04/30/2022 FINDINGS: CT CHEST FINDINGS Cardiovascular: Aortic atherosclerosis. Aortic valve  calcifications. Normal heart size. Three-vessel coronary artery calcifications. No pericardial effusion. Mediastinum/Nodes: No enlarged mediastinal, hilar, or axillary lymph nodes. Thyroid gland, trachea, and esophagus demonstrate no significant findings. Lungs/Pleura: Trace left pleural effusion. Bandlike scarring or atelectasis of the lung bases. No pleural effusion or pneumothorax. Musculoskeletal: No chest wall abnormality. New, although age indeterminate mild superior endplate wedge deformity of T5 with less than 25% anterior height loss (series 12, image 126). CT ABDOMEN PELVIS FINDINGS Hepatobiliary: Coarse, nodular cirrhotic morphology of the liver. No obvious liver lesion on noncontrast CT. Status post cholecystectomy. No biliary ductal dilatation. Pancreas: Unremarkable. No pancreatic ductal dilatation or surrounding inflammatory changes. Spleen: Mild splenomegaly, maximum span 14.3 cm. Adrenals/Urinary Tract: Adrenal glands are unremarkable. Kidneys are normal, without renal calculi, solid lesion, or hydronephrosis. Bladder is unremarkable. Stomach/Bowel: Stomach is within normal limits. Status post partial right hemicolectomy and ileocolic reanastomosis. No evidence of bowel wall thickening, distention, or inflammatory changes.  Vascular/Lymphatic: Aortic atherosclerosis. Large retroperitoneal and mesenteric varices (series 4, image 75, 67). No enlarged abdominal or pelvic lymph nodes. Reproductive: Calcified uterine fibroids. Other: Small, fat and fluid containing low midline ventral hernia (series 4, image 97). Severe anasarca. Small volume ascites throughout the abdomen and pelvis. Musculoskeletal: No acute osseous findings. CT LEFT LOWER EXTREMITY FINDINGS Soft tissues: Severe, diffuse, circumferential anasarca throughout the left lower extremity. To the extent that the right lower extremity is included for comparison, this appears symmetric. No focal fluid collection. Musculoskeletal: Sarcopenia.  No acute findings. Osseous structures and joints: No fracture or dislocation. Tricompartmental arthrosis of the left knee. No knee joint effusion. Vascular: Severe, diffuse atherosclerosis and vascular calcinosis. Superficial, calcified varices throughout the thigh and calf. IMPRESSION: 1. Severe, diffuse, circumferential anasarca throughout the left lower extremity. To the extent that the right lower extremity is included for comparison, this appears symmetric. No focal fluid collection. 2. Cirrhosis with evidence of portal hypertension including mild splenomegaly, large retroperitoneal and mesenteric varices, and small volume ascites throughout the abdomen and pelvis. 3. New, although age indeterminate mild superior endplate wedge deformity of T5 with less than 25% anterior height loss. Correlate for point tenderness. 4. Coronary artery disease. Aortic Atherosclerosis (ICD10-I70.0). Electronically Signed   By: Jearld Lesch M.D.   On: 08/11/2022 09:28   DG Chest Port 1 View  Result Date: 08/11/2022 CLINICAL DATA:  Questionable sepsis. Fatigue and shortness of breath. EXAM: PORTABLE CHEST 1 VIEW COMPARISON:  07/29/2022 FINDINGS: Stable cardiac enlargement. Low lung volumes. Scarring is noted within the left midlung. No pleural fluid,  interstitial edema or airspace disease. The visualized skeletal structures are unremarkable. IMPRESSION: Low lung volumes. No active disease. Electronically Signed   By: Signa Kell M.D.   On: 08/11/2022 07:12    Pending Labs Unresulted Labs (From admission, onward)     Start     Ordered   08/12/22 0500  Magnesium  Tomorrow morning,   R        08/11/22 1109   08/12/22 0500  Protime-INR  Daily,   R      08/11/22 1109   08/12/22 0500  Hepatic function panel  Tomorrow morning,   R        08/11/22 1109   08/12/22 0500  Basic metabolic panel  Daily,   R      08/11/22 1109   08/12/22 0500  CBC  Daily,   R      08/11/22 1109   08/11/22 1109  Cortisol  Add-on,   AD  08/11/22 1109   08/11/22 1010  Urine Culture (for pregnant, neutropenic or urologic patients or patients with an indwelling urinary catheter)  (Urine Labs)  Add-on,   AD       Question:  Indication  Answer:  Sepsis   08/11/22 1009   08/11/22 0641  Blood Culture (routine x 2)  (Septic presentation on arrival (screening labs, nursing and treatment orders for obvious sepsis))  BLOOD CULTURE X 2,   STAT      08/11/22 0640            Vitals/Pain Today's Vitals   08/11/22 1210 08/11/22 1215 08/11/22 1220 08/11/22 1225  BP: (!) 95/58 (!) 94/54 (!) 109/58 (!) 120/59  Pulse: 85 85 83 84  Resp: (!) 21 (!) 21 (!) 21 (!) 22  Temp:      TempSrc:      SpO2: 100% 100% 96% 98%  Weight:      Height:      PainSc:        Isolation Precautions Contact precautions  Medications Medications  polyethylene glycol (MIRALAX / GLYCOLAX) packet 17 g (has no administration in time range)  0.9 %  sodium chloride infusion (has no administration in time range)  norepinephrine (LEVOPHED) 4mg  in (0.016 mg/mL) premix infusion (4 mcg/min Intravenous Rate/Dose Change 08/11/22 1220)  albumin human 25 % solution 25 g (25 g Intravenous New Bag/Given 08/11/22 1202)  midodrine (PROAMATINE) tablet 5 mg (has no administration in time range)   lactated ringers bolus 500 mL (0 mLs Intravenous Stopped 08/11/22 0925)  clindamycin (CLEOCIN) IVPB 600 mg (0 mg Intravenous Stopped 08/11/22 1004)  vancomycin (VANCOREADY) IVPB 2000 mg/400 mL (0 mg Intravenous Stopped 08/11/22 1030)  meropenem (MERREM) 1 g in sodium chloride 0.9 % 100 mL IVPB (0 g Intravenous Stopped 08/11/22 0914)  sodium chloride 0.9 % bolus 500 mL (0 mLs Intravenous Stopped 08/11/22 1027)    Mobility non-ambulatory   Patient is drowsy, Oriented x4 Able to turn self and uses bedpan. Can be incontinent of stool.

## 2022-08-11 NOTE — ED Notes (Signed)
Patient incontinent of stool. Patient's linens changed and peri care done.

## 2022-08-11 NOTE — H&P (Signed)
NAME:  [name], MRN:  [id number], DOB:  May 04, 1955, LOS: 0 ADMISSION DATE:  08/11/2022, CONSULTATION DATE:  08/11/22 REFERRING MD:  Adan Sis, PA, CHIEF COMPLAINT:  fatigue/ confusion   History of Present Illness:   67 year old female with PMH as below significant for NASH cirrhosis, HE, morbid obesity, chronic diarrhea, and MDR UTI (e. Coli and Klebsiella) who presents from home with complaints of left leg pain, fatigue, mild confusion, and recent N/V.   Patient lives with her son and cared for by her son, currently at bedside.  He provides most history.  Reports she has been doing better, starting to ambulate some with walker.  Voices some confusion over her diuretic regimen between what [facility], PCP, and prior hospital discharge instructions were.  Has only been taking lasix 20mg  and spironolactone daily.  Has not needed midodrine.  Reports several bouts of N/V on Tuesday, son suspects related to bad food, with ongoing poor PO intake since.  Developed left leg pain yesterday in which she went to urgent care for diagnosed with cellulitis and placed on keflex. Son reports they have a stray kitten at home who frequently scratches them.  Also, patient has frequent itching/ scratching of skin and sinus drainage with clear sputum for about one month.  Reports patient was able to ambulate to the car this morning with her walker.  No further N/V , changes in stool from her chronic diarrhea, no fever, chills, no concern for bleeding.  Denies urinary symptoms.   In ER, afebrile, SBP 70-90's, and room air sats 90 but since improved to normal on room air. Labs significant for normal WBC, H/H 7.5/ 24.6, plts 96, Na 130, sCr 2.41, BUN 37, bicarb 13, albumin 1.9, protein 5, normal LFTs except t. Bili near baseline 2.2, INR 3.4, lactic 6.1> 4.2, ammonia 24, PCT 16.  UA noted for trace leukocytes with 21-50 WBC, 21-50 squamous cells, many bacteria. CXR without acute process.  Cultures sent, treated with vancomycin,  meropenem, and clindamycin.  Given NS bolus with minimal improvement in SBP, therefore placed on low dose peripheral levophed.  Underwent imaging CT chest/ abd/ pelvis, CT left femur and tibia/ fibula found to have symmetric severe/ diffuse circumferential anasarca without focal fluid collection and noted new indeterminate wedge deformity of T5.  Son reports mental status is improved with improved blood pressure.  PCCM called for admit.    Pertinent  Medical History  NASH cirrhosis, HE, morbid obesity, PE not on AC (2014), IDA, GERD, HTN but on prn midodrine, short gut syndrome s/p right colectomy, end ileostomy, subsequent takedown of end ileostomy, small bowel resection in 2009, s/p cholecystectomy, chronic diarrhea, one episode of PAF during one hospitalization MDR UTI, former smoker  Significant Hospital Events: Including procedures, antibiotic start and stop dates in addition to other pertinent events   8/1 Admit with hypotension, possible sepsis/ cellulitis of left leg, AKI, anasarca  Interim History / Subjective:   Objective   Blood pressure (!) 84/47, pulse 85, temperature 98.1 F (36.7 C), temperature source Oral, resp. rate 19, height 5\' 2"  (1.575 m), weight 106.6 kg, SpO2 98%.        Intake/Output Summary (Last 24 hours) at 08/11/2022 1113 Last data filed at 08/11/2022 1027 Gross per 24 hour  Intake 1150 ml  Output --  Net 1150 ml   Filed Weights   08/11/22 0636  Weight: 106.6 kg   Examination: General:  chronically ill appearing morbidly obese female lying in bed  in NAD HEENT: MM pink/dry, R pupil 3/r, left opaque Neuro: sleeping but arouses easily to verbal, oriented x3, MAE CV: rr, NSR, +murmur RSB PULM:  non labored, clear anteriorly, diminished in bases GI: obese, hyperBS, NT, voids  Extremities: warm/dry, diffuse severe anasarca, seems more like ecchymosis to inner thigh, not warm, mildly tender to touch, but left posterior/ inner calf with erythema, warmth,  tenderness.  Both LE appear similar in size.  See imaging below  Left inner leg  Left hip Left leg  CT chest/ abd/ pelvis/ left femur/ tib/fib>  1. Severe, diffuse, circumferential anasarca throughout the left lower extremity. To the extent that the right lower extremity is included for comparison, this appears symmetric. No focal fluid collection. 2. Cirrhosis with evidence of portal hypertension including mild splenomegaly, large retroperitoneal and mesenteric varices, and small volume ascites throughout the abdomen and pelvis. 3. New, although age indeterminate mild superior endplate wedge deformity of T5 with less than 25% anterior height loss. Correlate for point tenderness. 4. Coronary artery disease.  Aortic Atherosclerosis  Resolved Hospital Problem list    Assessment & Plan:   Shock> suspect multifactorial Sepsis 2/2 left lower leg cellulitis  - suspect multifactorial shock 2/2 hypovolemia from poor PO intake with recent N/V, sepsis 2/2 left lower leg cellulitis, r/o UTI (not symptomatic), and r/o AI - Also with hx of chronic hypotension, with prn midodrine at home - CXR neg P: - admit to ICU - cont low dose peripheral NE for MAP goal > 65, SBP > 90 - albumin 25mg  q6hrs x 3 - bedside ultrasound IVC assessment with collapsible IVC c/w hypovolemia> albumin as above, may consider LR bolus - add midodrine 5mg  TID - lactic trending down, may be slow to clear with cirrhosis  - remains afebrile, not tachycardic, normal WBC without shift but elevated PCT, will trend - cont with vancomycin and cefepime for now with azithro x 5 days to cover possible cat scratch/ bartonella   - follow cultures> blood and urine.  Abd remains non tender, small volume ascites on CT imaging> defer paracentesis for now, low suspicion for SBP  - CT imaging not c/w with focal collection on left leg.  Continue to monitor site closely  - check cortisol> of note prior cortisol stimulation test in 05/2022  with poor response but thought to be indeterminate in the setting of hypoalbuminemia and lack of symptoms with recs to f/u with endocrine outpt> do not see that she has had thus far.      AKI Hyponatremia NAGMA Lactic acidosis> improving Hypomagnesia  Hypoalbuminemia   - baseline ~ sCr 0.8 (May 2024) - no obstruction or hydronephrosis on CT imaging - mag 2gm now, repeat in am - s/p NS 500 ml, albumin as above - follow urine culture - trend renal indices  - strict I/Os, daily wts - avoid nephrotoxins, renal dose meds, hemodynamic support as above   NASH cirrhosis, advanced Coagulopathy, chronic  Anasarca  - MELD score 32 with 52.6% 3 month mortality.  Does not appear decompensated at this time, more related to sepsis and hypovolemia.  - ammonia at baseline.  No encephalopathy present  - cont lactulose 10gm BID > monitor stool/ chronic diarrhea output - cont rifaximin, hold lasix and spironolactone with AKI, BB with hypotension - trend INR - follows with [facility]> plans/ needs EGD for variceal screening and recently referred to hepatology.   Chronic normocytic anemia and thrombocytopenia  - baseline H/H runs 7-9, currently 7.5.  No concerns  for bleeding.  Plts at baseline.  Likely due to underlying cirrhosis - trend CBC, monitor for bleeding - SCDs for now   Hx HTN>  progressing into chronic hypotension? With prn midodrine HFpEF Aortic stenosis - prior TTE 06/2022> EF 60-65%, G1DD, mod AS P:  - hemodynamic support as above, cont to hold lasix, BB for now while hypotensive   GERD - cont PPI   Hypothyroidism - cont synthroid  Depression - cont venlafaxine  Morbid obesity - encourage outpt wt loss evaluation  GOC - son reports she has a DNR at home.  Pt verified DNR/ DNI status, ok with aggressive medical management otherwise.   Best Practice (right click and "Reselect all SmartList Selections" daily)   Diet/type: Regular consistency (see orders) DVT  prophylaxis: SCD GI prophylaxis: PPI Lines: N/A Foley:  N/A Code Status:  DNR Last date of multidisciplinary goals of care discussion [8/1]  Pt and son updated at bedside.   Labs   CBC: Recent Labs  Lab 08/11/22 0730  WBC 6.2  NEUTROABS 5.4  HGB 7.5*  HCT 24.6*  MCV 99.6  PLT 96*    Basic Metabolic Panel: Recent Labs  Lab 08/11/22 0730  NA 130*  K 3.7  CL 103  CO2 13*  GLUCOSE 108*  BUN 37*  CREATININE 2.41*  CALCIUM 7.8*   GFR: Estimated Creatinine Clearance: 27.1 mL/min (A) (by C-G formula based on SCr of 2.41 mg/dL (H)). Recent Labs  Lab 08/11/22 0730 08/11/22 0739 08/11/22 0939  WBC 6.2  --   --   LATICACIDVEN  --  6.1* 4.2*    Liver Function Tests: Recent Labs  Lab 08/11/22 0730  AST 32  ALT 28  ALKPHOS 98  BILITOT 2.2*  PROT 5.0*  ALBUMIN 1.9*   No results for input(s): "LIPASE", "AMYLASE" in the last 168 hours. Recent Labs  Lab 08/11/22 0730  AMMONIA 24    ABG    Component Value Date/Time   HCO3 21.7 06/12/2022 2145   TCO2 14 (L) 04/30/2022 2034   ACIDBASEDEF 2.5 (H) 06/12/2022 2145   O2SAT 77.4 06/12/2022 2145     Coagulation Profile: Recent Labs  Lab 08/11/22 0730  INR 3.4*    Cardiac Enzymes: No results for input(s): "CKTOTAL", "CKMB", "CKMBINDEX", "TROPONINI" in the last 168 hours.  HbA1C: No results found for: "HGBA1C"  CBG: No results for input(s): "GLUCAP" in the last 168 hours.  Review of Systems:   As per HPI otherwise negative  Past Medical History:  She,  has a past medical history of Chronic diarrhea, Cirrhosis, non-alcoholic (HCC) (05/01/2022), Heart murmur, Hypertension, Kidney stone, Obesity, Pulmonary embolism (HCC), Short gut syndrome, and Thyroid disease.   Surgical History:   Past Surgical History:  Procedure Laterality Date   CHOLECYSTECTOMY     COLON SURGERY     HERNIA REPAIR     ILEOSTOMY     ILEOSTOMY CLOSURE     KIDNEY STONE SURGERY     WRIST SURGERY       Social History:    reports that she quit smoking about 37 years ago. Her smoking use included cigarettes. She has been exposed to tobacco smoke. She has never used smokeless tobacco. She reports that she does not drink alcohol and does not use drugs.   Family History:  Her family history is not on file.   Allergies Allergies  Allergen Reactions   Ivp Dye [Iodinated Contrast Media] Anaphylaxis   Ace Inhibitors Swelling   Chlorhexidine  Pt has a rash, & made irritation skin further   Doxycycline Rash   Penicillins Rash   Zofran [Ondansetron] Rash     Home Medications  Prior to Admission medications   Medication Sig Start Date End Date Taking? Authorizing Provider  acetaminophen (TYLENOL) 325 MG tablet Take 650 mg by mouth every 6 (six) hours as needed for moderate pain or headache. Patient used this medication for her headache.    [provider]  allopurinol (ZYLOPRIM) 100 MG tablet Take 100 mg by mouth daily. 12/31/21   [provider]  atorvastatin (LIPITOR) 40 MG tablet Take 40 mg by mouth in the morning. 01/19/22   [provider]  colchicine 0.6 MG tablet Take 0.6 mg by mouth daily as needed (gout).    [provider]  cyanocobalamin (VITAMIN B12) 1000 MCG tablet Take 2,000 mcg by mouth daily.    [provider]  EPINEPHrine 0.3 mg/0.3 mL IJ SOAJ injection Inject 0.3 mg into the muscle as needed for anaphylaxis. 08/07/19   [provider]  famotidine (PEPCID) 20 MG tablet Take 1 tablet by mouth daily. 06/08/22   [provider]  folic acid (FOLVITE) 1 MG tablet Take 1 tablet (1 mg total) by mouth daily. 06/16/22   Calvert Cantor, MD  furosemide (LASIX) 40 MG tablet Take 1 tablet (40 mg total) by mouth daily. 05/22/22   Leatha Gilding, MD  HYDROcodone-acetaminophen (NORCO) 10-325 MG tablet Take 1 tablet by mouth every 6 (six) hours as needed for moderate pain or severe pain. 02/25/22   [provider]  hydrOXYzine (ATARAX/VISTARIL)  25 MG tablet Take 25 mg by mouth 3 (three) times daily as needed for itching.    [provider]  IRON-VITAMIN C PO Take 1 tablet by mouth daily.    [provider]  lactulose (CHRONULAC) 10 GM/15ML solution Take 15 mLs (10 g total) by mouth 2 (two) times daily. Patient taking differently: Take 10 g by mouth 2 (two) times daily as needed for mild constipation or moderate constipation. 05/10/22   Osvaldo Shipper, MD  levothyroxine (SYNTHROID) 150 MCG tablet Take 150 mcg by mouth daily before breakfast. 10/26/21   [provider]  magnesium gluconate (MAGONATE) 500 MG tablet Take 1,000 mg by mouth daily.    [provider]  methocarbamol (ROBAXIN) 500 MG tablet Take 500 mg by mouth daily. 03/04/22   [provider]  metoprolol succinate (TOPROL-XL) 25 MG 24 hr tablet Take 12.5 mg by mouth daily as needed (afib).    [provider]  midodrine (PROAMATINE) 5 MG tablet Take 5 mg by mouth daily as needed (low blood pressure).    [provider]  naloxone [facility]) nasal spray 4 mg/0.1 mL Place 1 spray into the nose as needed (accidental overdose). 03/25/22   [provider]  neomycin-polymyxin b-dexamethasone (MAXITROL) 3.5-10000-0.1 OINT Place 1 Application into the left eye 3 (three) times daily.    [provider]  nystatin cream (MYCOSTATIN) Apply topically 2 (two) times daily. Patient taking differently: Apply 1 Application topically 2 (two) times daily as needed for dry skin. 05/22/22   Leatha Gilding, MD  pantoprazole (PROTONIX) 40 MG tablet Take 1 tablet (40 mg total) by mouth daily. 05/10/22   Osvaldo Shipper, MD  promethazine (PHENERGAN) 12.5 MG tablet Take 12.5 mg by mouth every 6 (six) hours as needed for nausea or vomiting.    [provider]  rifaximin (XIFAXAN) 550 MG TABS tablet Take 550 mg by  mouth 2 (two) times daily.    [provider]  spironolactone (ALDACTONE) 25 MG tablet Take 0.5 tablets  (12.5 mg total) by mouth daily. Patient taking differently: Take 25 mg by mouth daily. 05/22/22   Leatha Gilding, MD  triamcinolone cream (KENALOG) 0.5 % Apply to affected area of right breast twice daily as directed. Patient taking differently: Apply 1 Application topically 2 (two) times daily as needed (rash). 05/10/22   Osvaldo Shipper, MD  venlafaxine XR (EFFEXOR-XR) 75 MG 24 hr capsule Take 75 mg by mouth daily with breakfast. 08/17/21   [provider]  Vitamin D, Ergocalciferol, (DRISDOL) 50000 UNITS CAPS Take 50,000 Units by mouth every 7 (seven) days.    [provider]     Critical care time: 55 mins     Posey Boyer, MSN, NP, AG-ACNP-BC [facility] 08/11/2022, 11:13 AM  See Amion for pager If no response to pager , please call [phone] until 7pm After 7:00 pm call Elink  [phone]

## 2022-08-11 NOTE — ED Triage Notes (Signed)
Patient coming to ED for evaluation of fatigue, SHOB, and "she is retaining water."  Recently seen at Urgent Care and dx with cellulitis.  Family members states "this morning she is worse.  It all started two days ago, but she is not doing good now.  Her oxygen level has been good, but her lips have been different colors."

## 2022-08-11 NOTE — Progress Notes (Signed)
Consult request placed for USGPIV for vasopressor administration. Bilateral arms assessed using [place]. No vessels found suitable (either depth was too great or vessel size too small).  Primary RN notified

## 2022-08-11 NOTE — Progress Notes (Signed)
Elink is following Code Sepsis 

## 2022-08-12 ENCOUNTER — Inpatient Hospital Stay: Payer: Self-pay

## 2022-08-12 ENCOUNTER — Inpatient Hospital Stay (HOSPITAL_COMMUNITY): Payer: Medicare (Managed Care)

## 2022-08-12 DIAGNOSIS — R6521 Severe sepsis with septic shock: Secondary | ICD-10-CM | POA: Diagnosis not present

## 2022-08-12 DIAGNOSIS — E861 Hypovolemia: Secondary | ICD-10-CM | POA: Diagnosis not present

## 2022-08-12 DIAGNOSIS — R609 Edema, unspecified: Secondary | ICD-10-CM

## 2022-08-12 DIAGNOSIS — N179 Acute kidney failure, unspecified: Secondary | ICD-10-CM | POA: Diagnosis not present

## 2022-08-12 DIAGNOSIS — A419 Sepsis, unspecified organism: Secondary | ICD-10-CM | POA: Diagnosis not present

## 2022-08-12 LAB — CBC
HCT: 25.2 % — ABNORMAL LOW (ref 36.0–46.0)
Hemoglobin: 7.5 g/dL — ABNORMAL LOW (ref 12.0–15.0)
MCH: 30.4 pg (ref 26.0–34.0)
MCHC: 29.8 g/dL — ABNORMAL LOW (ref 30.0–36.0)
MCV: 102 fL — ABNORMAL HIGH (ref 80.0–100.0)
Platelets: 111 10*3/uL — ABNORMAL LOW (ref 150–400)
RBC: 2.47 MIL/uL — ABNORMAL LOW (ref 3.87–5.11)
RDW: 17 % — ABNORMAL HIGH (ref 11.5–15.5)
WBC: 17.2 10*3/uL — ABNORMAL HIGH (ref 4.0–10.5)
nRBC: 0 % (ref 0.0–0.2)

## 2022-08-12 LAB — DIC (DISSEMINATED INTRAVASCULAR COAGULATION)PANEL
D-Dimer, Quant: 2.35 ug/mL-FEU — ABNORMAL HIGH (ref 0.00–0.50)
Fibrinogen: 309 mg/dL (ref 210–475)
INR: 3.4 — ABNORMAL HIGH (ref 0.8–1.2)
Platelets: 129 10*3/uL — ABNORMAL LOW (ref 150–400)
Prothrombin Time: 34.5 seconds — ABNORMAL HIGH (ref 11.4–15.2)
Smear Review: NONE SEEN
aPTT: 53 seconds — ABNORMAL HIGH (ref 24–36)

## 2022-08-12 LAB — GLUCOSE, CAPILLARY
Glucose-Capillary: 101 mg/dL — ABNORMAL HIGH (ref 70–99)
Glucose-Capillary: 110 mg/dL — ABNORMAL HIGH (ref 70–99)
Glucose-Capillary: 114 mg/dL — ABNORMAL HIGH (ref 70–99)
Glucose-Capillary: 136 mg/dL — ABNORMAL HIGH (ref 70–99)
Glucose-Capillary: 141 mg/dL — ABNORMAL HIGH (ref 70–99)
Glucose-Capillary: 153 mg/dL — ABNORMAL HIGH (ref 70–99)

## 2022-08-12 LAB — TYPE AND SCREEN
ABO/RH(D): AB NEG
Antibody Screen: NEGATIVE

## 2022-08-12 MED ORDER — OXYCODONE HCL 5 MG PO TABS
5.0000 mg | ORAL_TABLET | Freq: Four times a day (QID) | ORAL | Status: DC | PRN
  Administered 2022-08-12 – 2022-08-13 (×3): 5 mg via ORAL
  Filled 2022-08-12 (×3): qty 1

## 2022-08-12 MED ORDER — PHYTONADIONE 5 MG PO TABS
5.0000 mg | ORAL_TABLET | Freq: Once | ORAL | Status: AC
  Administered 2022-08-12: 5 mg via ORAL
  Filled 2022-08-12: qty 1

## 2022-08-12 MED ORDER — NOREPINEPHRINE 4 MG/250ML-% IV SOLN
0.0000 ug/min | INTRAVENOUS | Status: DC
  Administered 2022-08-12: 3 ug/min via INTRAVENOUS
  Administered 2022-08-12: 7 ug/min via INTRAVENOUS
  Administered 2022-08-14: 1 ug/min via INTRAVENOUS
  Filled 2022-08-12 (×2): qty 250

## 2022-08-12 MED ORDER — MIDODRINE HCL 5 MG PO TABS
15.0000 mg | ORAL_TABLET | Freq: Three times a day (TID) | ORAL | Status: DC
  Administered 2022-08-12 – 2022-08-13 (×4): 15 mg via ORAL
  Filled 2022-08-12 (×5): qty 3

## 2022-08-12 MED ORDER — ALBUMIN HUMAN 25 % IV SOLN
25.0000 g | Freq: Once | INTRAVENOUS | Status: AC
  Administered 2022-08-12: 25 g via INTRAVENOUS
  Filled 2022-08-12: qty 100

## 2022-08-12 MED ORDER — OXIDIZED CELLULOSE EX PADS
1.0000 | MEDICATED_PAD | Freq: Once | CUTANEOUS | Status: AC
  Administered 2022-08-12: 1 via TOPICAL
  Filled 2022-08-12: qty 1

## 2022-08-12 MED ORDER — SODIUM CHLORIDE 0.9% FLUSH
10.0000 mL | INTRAVENOUS | Status: DC | PRN
  Administered 2022-08-21: 10 mL
  Administered 2022-08-24: 30 mL

## 2022-08-12 MED ORDER — SODIUM CHLORIDE 0.9 % IV SOLN
2.0000 g | Freq: Two times a day (BID) | INTRAVENOUS | Status: DC
  Administered 2022-08-12: 2 g via INTRAVENOUS
  Filled 2022-08-12: qty 12.5

## 2022-08-12 MED ORDER — ACETAMINOPHEN 325 MG PO TABS
650.0000 mg | ORAL_TABLET | Freq: Four times a day (QID) | ORAL | Status: DC | PRN
  Administered 2022-08-13 – 2022-08-30 (×6): 650 mg via ORAL
  Filled 2022-08-12 (×6): qty 2

## 2022-08-12 MED ORDER — MIDODRINE HCL 5 MG PO TABS
10.0000 mg | ORAL_TABLET | Freq: Three times a day (TID) | ORAL | Status: DC
  Administered 2022-08-12 (×2): 10 mg via ORAL
  Filled 2022-08-12 (×2): qty 2

## 2022-08-12 MED ORDER — SODIUM CHLORIDE 0.9% FLUSH
10.0000 mL | Freq: Two times a day (BID) | INTRAVENOUS | Status: DC
  Administered 2022-08-12: 30 mL
  Administered 2022-08-12 – 2022-08-17 (×9): 10 mL
  Administered 2022-08-17: 20 mL
  Administered 2022-08-18 – 2022-08-24 (×13): 10 mL
  Administered 2022-08-24: 30 mL
  Administered 2022-08-25 – 2022-08-27 (×5): 10 mL
  Administered 2022-08-28: 30 mL
  Administered 2022-08-28 – 2022-08-29 (×2): 10 mL
  Administered 2022-08-29 – 2022-08-30 (×2): 20 mL
  Administered 2022-08-30 – 2022-09-03 (×7): 10 mL
  Administered 2022-09-04: 20 mL
  Administered 2022-09-04 – 2022-09-09 (×8): 10 mL

## 2022-08-12 MED ORDER — LACTATED RINGERS IV BOLUS
500.0000 mL | Freq: Once | INTRAVENOUS | Status: AC
  Administered 2022-08-12: 500 mL via INTRAVENOUS

## 2022-08-12 MED ORDER — "THROMBI-PAD 3""X3"" EX PADS"
1.0000 | MEDICATED_PAD | Freq: Once | CUTANEOUS | Status: DC
  Filled 2022-08-12: qty 1

## 2022-08-12 NOTE — Progress Notes (Signed)
Peripherally Inserted Central Catheter Placement  The IV Nurse has discussed with the patient and/or persons authorized to consent for the patient, the purpose of this procedure and the potential benefits and risks involved with this procedure.  The benefits include less needle sticks, lab draws from the catheter, and the patient may be discharged home with the catheter. Risks include, but not limited to, infection, bleeding, blood clot (thrombus formation), and puncture of an artery; nerve damage and irregular heartbeat and possibility to perform a PICC exchange if needed/ordered by physician.  Alternatives to this procedure were also discussed.  Bard Power PICC patient education guide, fact sheet on infection prevention and patient information card has been provided to patient /or left at bedside.    PICC Placement Documentation  PICC Triple Lumen 08/12/22 Right Brachial 40 cm 0 cm (Active)  Indication for Insertion or Continuance of Line Vasoactive infusions 08/12/22 1028  Exposed Catheter (cm) 0 cm 08/12/22 1028  Site Assessment Clean, Dry, Intact 08/12/22 1028  Lumen #1 Status Flushed;Saline locked;Blood return noted 08/12/22 1028  Lumen #2 Status Flushed;Saline locked;Blood return noted 08/12/22 1028  Lumen #3 Status Flushed;Saline locked;Blood return noted 08/12/22 1028  Dressing Type Transparent;Securing device 08/12/22 1028  Dressing Status Antimicrobial disc in place;Clean, Dry, Intact 08/12/22 1028  Safety Lock Not Applicable 08/12/22 1028  Line Adjustment (NICU/IV Team Only) No 08/12/22 1028  Dressing Intervention New dressing;Other (Comment) 08/12/22 1028  Dressing Change Due 08/19/22 08/12/22 1028       [name] 08/12/2022, 10:30 AM

## 2022-08-12 NOTE — Progress Notes (Signed)
NAME:  [name], MRN:  [id number], DOB:  [birth year], LOS: 1 ADMISSION DATE:  08/11/2022, CONSULTATION DATE:  08/11/22 REFERRING MD:  Adan Sis, PA, CHIEF COMPLAINT:  fatigue/ confusion   History of Present Illness:   67 year old female with PMH as below significant for NASH cirrhosis, HE, morbid obesity, chronic diarrhea, and MDR UTI (e. Coli and Klebsiella) who presents from home with complaints of left leg pain, fatigue, mild confusion, and recent N/V.   Patient lives with her son and cared for by her son, currently at bedside.  He provides most history.  Reports she has been doing better, starting to ambulate some with walker.  Voices some confusion over her diuretic regimen between what [facility], PCP, and prior hospital discharge instructions were.  Has only been taking lasix 20mg  and spironolactone daily.  Has not needed midodrine.  Reports several bouts of N/V on Tuesday, son suspects related to bad food, with ongoing poor PO intake since.  Developed left leg pain yesterday in which she went to urgent care for diagnosed with cellulitis and placed on keflex. Son reports they have a stray kitten at home who frequently scratches them.  Also, patient has frequent itching/ scratching of skin and sinus drainage with clear sputum for about one month.  Reports patient was able to ambulate to the car this morning with her walker.  No further N/V , changes in stool from her chronic diarrhea, no fever, chills, no concern for bleeding.  Denies urinary symptoms.   In ER, afebrile, SBP 70-90's, and room air sats 90 but since improved to normal on room air. Labs significant for normal WBC, H/H 7.5/ 24.6, plts 96, Na 130, sCr 2.41, BUN 37, bicarb 13, albumin 1.9, protein 5, normal LFTs except t. Bili near baseline 2.2, INR 3.4, lactic 6.1> 4.2, ammonia 24, PCT 16.  UA noted for trace leukocytes with 21-50 WBC, 21-50 squamous cells, many bacteria. CXR without acute process.  Cultures sent, treated with vancomycin,  meropenem, and clindamycin.  Given NS bolus with minimal improvement in SBP, therefore placed on low dose peripheral levophed.  Underwent imaging CT chest/ abd/ pelvis, CT left femur and tibia/ fibula found to have symmetric severe/ diffuse circumferential anasarca without focal fluid collection and noted new indeterminate wedge deformity of T5.  Son reports mental status is improved with improved blood pressure.  PCCM called for admit.    Pertinent  Medical History  NASH cirrhosis, HE, morbid obesity, PE not on AC (2014), IDA, GERD, HTN but on prn midodrine, short gut syndrome s/p right colectomy, end ileostomy, subsequent takedown of end ileostomy, small bowel resection in 2009, s/p cholecystectomy, chronic diarrhea, one episode of PAF during one hospitalization MDR UTI, former smoker  Significant Hospital Events: Including procedures, antibiotic start and stop dates in addition to other pertinent events   8/1 Admit with hypotension, possible sepsis/ cellulitis of left leg, AKI, anasarca  Interim History / Subjective:  Feels better, less leg pain.  Hungry.  Remains on NE 3 mcg Better sCr but oliguria overnight Am CBC clotted, pending redraw  Objective   Blood pressure (!) 107/45, pulse 65, temperature 97.6 F (36.4 C), temperature source Oral, resp. rate 10, height 5\' 2"  (1.575 m), weight 124.7 kg, SpO2 96%.        Intake/Output Summary (Last 24 hours) at 08/12/2022 0739 Last data filed at 08/12/2022 0732 Gross per 24 hour  Intake 2318.53 ml  Output 220 ml  Net 2098.53 ml  Filed Weights   08/11/22 0636 08/11/22 1320 08/12/22 0500  Weight: 106.6 kg 121.8 kg 124.7 kg   Examination: General:  chronically ill appearing morbidly obese female lying in bed, sleeping in NAD HEENT: MM pink/dry, R pupil 4/r, L opaque, anicteric Neuro: awakens easily to verbal, oriented x 3, MAE CV: rr, +murmur PULM:  non labored, clear anteriorly, diminished in bases GI: soft, bs+, some suprapubic  tenderness, purwick Extremities: warm/dry, diffuse anasarca, left thigh remains unchanged> appears to be large area of ecchymosis but no erythema, tenderness or warmth.  Left calf looks better, not extending beyond marked borders, less tender, less erythema  Labs reviewed> Na 129, K 4.6, bicarb 17, BUN 41, sCr2.41> 2.1, t. Bili 2.2> 3.  Pending CBC  Micro> BC x 2> GNR in both sets>              UC>   Resolved Hospital Problem list    Assessment & Plan:   Shock- multifactorial> GNR bacteremia, LLE cellulitis, r/o UTI, and hypovolemic, +/- AI component - Also with hx of chronic hypotension, with prn midodrine at home - CT imaging not c/w with focal collection on left leg.  CXR/ CT chest neg for acute process  P: - cont to wean peripheral NE for MAP goal >65 - still looks intravascularly dry with oliguria, will give additional albumin and LR 500 ml bolus - cont cefepime and azithro (for 5 days, to cover cat scratches/ bartonella.  Stop vanc - follow UC and final report on Bcs - increase midodrine to 10mg  TID - cont stress dose steroids for now.  Inconclusive cortisol stem test in May, with recs then to f/u w/ OP endocrinology  - trend WBC/ fever curve  - left leg cellulitis improving clinically, cont to monitor closely  - minimal ascites on imaging, without abd pain/ tenderness, defer diagnostic paracentesis for now  AKI Hyponatremia NAGMA Lactic acidosis> improving Hypomagnesia  Hypoalbuminemia   - no obstruction or hydronephrosis on CT imaging - baseline ~ sCr 0.8 (May 2024) P:  - improving sCr, BUN slightly up, but with oliguria - bladder scan to r/o obstruction - replete electrolytes as needed - trend renal indices  - strict I/Os, daily wts - avoid nephrotoxins, renal dose meds, hemodynamic support as above   NASH cirrhosis, advanced Coagulopathy, chronic  Anasarca  Chronic diarrhea  - MELD score 32 with 52.6% 3 month mortality.  Does not appear decompensated at this  time, more related to sepsis and hypovolemia.  - ammonia at baseline.  No encephalopathy present  P:  - alk phos, AST/ ALT remains wnl, rising t. Bili 2.2> 3.  Will obtain RUQ [place] today then resume low salt diet - cont lactulose 10gm BID > monitor stool/ chronic diarrhea output> one stool overnight.  - cont rifaximin, cont hold lasix and spironolactone while on pressors - INR improving - follows with [facility]> plans/ needs EGD for variceal screening and recently referred to hepatology.     Chronic normocytic anemia and thrombocytopenia  - baseline H/H runs 7-9, admit Hgb 7.5.  - T&S sent 8/1 P:  - pending CBC - cont SCDs for now, if H/H stable, add SQ heparin   Hx HTN>  progressing into chronic hypotension? With prn midodrine HFpEF Aortic stenosis - prior TTE 06/2022> EF 60-65%, G1DD, mod AS P:  - cont to hold lasix, BB - will need to f/u with cardiology outpt for AS.  Denies any syncope, dizziness, or chest pain on exertion.  GERD - cont PPI    Hypothyroidism - cont synthroid   Depression - cont venlafaxine   Morbid obesity - encourage outpt wt loss evaluation   Thoracic spine, T5 wedge deformity - pt reports some chronic back pain, denies injury.  States follows with othro. F/u OP   GOC - son reports she has a DNR at home.  Pt verified DNR/ DNI status, ok with aggressive medical management otherwise.   Best Practice (right click and "Reselect all SmartList Selections" daily)   Diet/type: Regular consistency (see orders)> low salt DVT prophylaxis: SCD GI prophylaxis: PPI Lines: N/A Foley:  N/A Code Status:  DNR/ DNI Last date of multidisciplinary goals of care discussion [8/1]  Pt updated on plan of care.    Labs   CBC: Recent Labs  Lab 08/11/22 0730  WBC 6.2  NEUTROABS 5.4  HGB 7.5*  HCT 24.6*  MCV 99.6  PLT 96*    Basic Metabolic Panel: Recent Labs  Lab 08/11/22 0730 08/12/22 0325  NA 130* 129*  K 3.7 4.6  CL 103 103  CO2 13* 17*   GLUCOSE 108* 126*  BUN 37* 41*  CREATININE 2.41* 2.10*  CALCIUM 7.8* 8.3*  MG 1.4* 1.9   GFR: Estimated Creatinine Clearance: 34.1 mL/min (A) (by C-G formula based on SCr of 2.1 mg/dL (H)). Recent Labs  Lab 08/11/22 0730 08/11/22 0739 08/11/22 0939  PROCALCITON 16.20  --   --   WBC 6.2  --   --   LATICACIDVEN  --  6.1* 4.2*    Liver Function Tests: Recent Labs  Lab 08/11/22 0730 08/12/22 0325  AST 32 22  ALT 28 25  ALKPHOS 98 86  BILITOT 2.2* 3.0*  PROT 5.0* 5.6*  ALBUMIN 1.9* 2.8*   No results for input(s): "LIPASE", "AMYLASE" in the last 168 hours. Recent Labs  Lab 08/11/22 0730  AMMONIA 24    ABG    Component Value Date/Time   HCO3 21.7 06/12/2022 2145   TCO2 14 (L) 04/30/2022 2034   ACIDBASEDEF 2.5 (H) 06/12/2022 2145   O2SAT 77.4 06/12/2022 2145     Coagulation Profile: Recent Labs  Lab 08/11/22 0730 08/12/22 0325  INR 3.4* 2.3*    Cardiac Enzymes: No results for input(s): "CKTOTAL", "CKMB", "CKMBINDEX", "TROPONINI" in the last 168 hours.  HbA1C: No results found for: "HGBA1C"  CBG: Recent Labs  Lab 08/11/22 1331 08/11/22 1544 08/11/22 2050 08/11/22 2358 08/12/22 0340  GLUCAP 108* 82 95 101* 114*    Critical care time: 32 mins     Posey Boyer, MSN, NP, AG-ACNP-BC [facility] 08/12/2022, 7:39 AM  See Amion for pager If no response to pager , please call [phone] until 7pm After 7:00 pm call Elink  [phone]

## 2022-08-12 NOTE — Progress Notes (Signed)
Lower extremity venous bilateral study completed.   Please see CV Proc for preliminary results.    , RDMS, RVT  

## 2022-08-12 NOTE — Plan of Care (Signed)
  Problem: Pain Managment: Goal: General experience of comfort will improve Outcome: Progressing   Problem: Safety: Goal: Ability to remain free from injury will improve Outcome: Progressing   Problem: Skin Integrity: Goal: Risk for impaired skin integrity will decrease Outcome: Progressing   

## 2022-08-12 NOTE — Progress Notes (Signed)
PHARMACY - PHYSICIAN COMMUNICATION CRITICAL VALUE ALERT - BLOOD CULTURE IDENTIFICATION (BCID)  [name] is an 67 y.o. female who presented to [facility] on 08/11/2022 with a chief complaint of sepsis  Assessment:  2/2 BCx (2/4 bottles) growing unidentified GNR [suspect LLE cellulitis; possibly cat scratch disease (Bartonella)]  Name of physician (or Provider) Contacted: Virgel Manifold  Current antibiotics: Vancomycin, Cefepime, Azithromycin  Changes to prescribed antibiotics recommended: stop vancomycin, continue Cefepime + Azithromycin until GNR speciate - Overnight NP in agreement, but will defer narrowing vancomycin to rounding (CCM) MD; next vanc dose not due until 8/3  Results for orders placed or performed during the hospital encounter of 08/11/22  Blood Culture ID Panel (Reflexed) (Collected: 08/11/2022  7:30 AM)  Result Value Ref Range   Enterococcus faecalis NOT DETECTED NOT DETECTED   Enterococcus Faecium NOT DETECTED NOT DETECTED   Listeria monocytogenes NOT DETECTED NOT DETECTED   Staphylococcus species NOT DETECTED NOT DETECTED   Staphylococcus aureus (BCID) NOT DETECTED NOT DETECTED   Staphylococcus epidermidis NOT DETECTED NOT DETECTED   Staphylococcus lugdunensis NOT DETECTED NOT DETECTED   Streptococcus species NOT DETECTED NOT DETECTED   Streptococcus agalactiae NOT DETECTED NOT DETECTED   Streptococcus pneumoniae NOT DETECTED NOT DETECTED   Streptococcus pyogenes NOT DETECTED NOT DETECTED   A.calcoaceticus-baumannii NOT DETECTED NOT DETECTED   Bacteroides fragilis NOT DETECTED NOT DETECTED   Enterobacterales NOT DETECTED NOT DETECTED   Enterobacter cloacae complex NOT DETECTED NOT DETECTED   Escherichia coli NOT DETECTED NOT DETECTED   Klebsiella aerogenes NOT DETECTED NOT DETECTED   Klebsiella oxytoca NOT DETECTED NOT DETECTED   Klebsiella pneumoniae NOT DETECTED NOT DETECTED   Proteus species NOT DETECTED NOT DETECTED   Salmonella species NOT DETECTED NOT DETECTED    Serratia marcescens NOT DETECTED NOT DETECTED   Haemophilus influenzae NOT DETECTED NOT DETECTED   Neisseria meningitidis NOT DETECTED NOT DETECTED   Pseudomonas aeruginosa NOT DETECTED NOT DETECTED   Stenotrophomonas maltophilia NOT DETECTED NOT DETECTED   Candida albicans NOT DETECTED NOT DETECTED   Candida auris NOT DETECTED NOT DETECTED   Candida glabrata NOT DETECTED NOT DETECTED   Candida krusei NOT DETECTED NOT DETECTED   Candida parapsilosis NOT DETECTED NOT DETECTED   Candida tropicalis NOT DETECTED NOT DETECTED   Cryptococcus neoformans/gattii NOT DETECTED NOT DETECTED    ,  A 08/12/2022  3:41 AM

## 2022-08-12 NOTE — Progress Notes (Signed)
  Pt having increasing swelling in RUE s/p PICC placement, concerning for expanding hematoma.  Bedside RN reported that pt had prolonged bleeding after removing non-functioning PIV earlier today.  Distal pulse, motor, and sensory remain intact.   -  INR down 3.4> 2.3 this am, plt 111, BUN 41   P:  - compressive dressing to site and monitor closely, with frequent PMS checks  - check DIC panel - vit K 5mg  x 1 PO - check CXR for placement      Kara Boyer, MSN, NP, AG-ACNP-BC [facility] 08/12/2022, 3:01 PM  See Amion for pager If no response to pager , please call [phone] until 7pm After 7:00 pm call Elink  [phone]

## 2022-08-13 DIAGNOSIS — E861 Hypovolemia: Secondary | ICD-10-CM | POA: Diagnosis not present

## 2022-08-13 LAB — CBC WITH DIFFERENTIAL/PLATELET
Abs Immature Granulocytes: 0.14 10*3/uL — ABNORMAL HIGH (ref 0.00–0.07)
Basophils Absolute: 0 10*3/uL (ref 0.0–0.1)
Basophils Relative: 0 %
Eosinophils Absolute: 0 10*3/uL (ref 0.0–0.5)
Eosinophils Relative: 0 %
HCT: 22.3 % — ABNORMAL LOW (ref 36.0–46.0)
Hemoglobin: 7.1 g/dL — ABNORMAL LOW (ref 12.0–15.0)
Immature Granulocytes: 1 %
Lymphocytes Relative: 4 %
Lymphs Abs: 0.8 10*3/uL (ref 0.7–4.0)
MCH: 30.3 pg (ref 26.0–34.0)
MCHC: 31.8 g/dL (ref 30.0–36.0)
MCV: 95.3 fL (ref 80.0–100.0)
Monocytes Absolute: 2.1 10*3/uL — ABNORMAL HIGH (ref 0.1–1.0)
Monocytes Relative: 11 %
Neutro Abs: 16.6 10*3/uL — ABNORMAL HIGH (ref 1.7–7.7)
Neutrophils Relative %: 84 %
Platelets: 95 10*3/uL — ABNORMAL LOW (ref 150–400)
RBC: 2.34 MIL/uL — ABNORMAL LOW (ref 3.87–5.11)
RDW: 16.3 % — ABNORMAL HIGH (ref 11.5–15.5)
WBC: 19.7 10*3/uL — ABNORMAL HIGH (ref 4.0–10.5)
nRBC: 0.4 % — ABNORMAL HIGH (ref 0.0–0.2)

## 2022-08-13 LAB — BLOOD GAS, VENOUS
Acid-base deficit: 9.6 mmol/L — ABNORMAL HIGH (ref 0.0–2.0)
Acid-base deficit: 8.8 mmol/L — ABNORMAL HIGH (ref 0.0–2.0)
Bicarbonate: 16.5 mmol/L — ABNORMAL LOW (ref 20.0–28.0)
Bicarbonate: 15.8 mmol/L — ABNORMAL LOW (ref 20.0–28.0)
O2 Saturation: 74.7 %
O2 Saturation: 100 %
Patient temperature: 37
Patient temperature: 37
pCO2, Ven: 36 mmHg — ABNORMAL LOW (ref 44–60)
pCO2, Ven: 30 mmHg — ABNORMAL LOW (ref 44–60)
pH, Ven: 7.27 (ref 7.25–7.43)
pH, Ven: 7.33 (ref 7.25–7.43)
pO2, Ven: 44 mmHg (ref 32–45)
pO2, Ven: 156 mmHg — ABNORMAL HIGH (ref 32–45)

## 2022-08-13 LAB — BASIC METABOLIC PANEL
Anion gap: 10 (ref 5–15)
BUN: 54 mg/dL — ABNORMAL HIGH (ref 8–23)
CO2: 16 mmol/L — ABNORMAL LOW (ref 22–32)
Calcium: 7.7 mg/dL — ABNORMAL LOW (ref 8.9–10.3)
Chloride: 102 mmol/L (ref 98–111)
Creatinine, Ser: 2.65 mg/dL — ABNORMAL HIGH (ref 0.44–1.00)
GFR, Estimated: 20 mL/min — ABNORMAL LOW (ref >60)
Glucose, Bld: 155 mg/dL — ABNORMAL HIGH (ref 70–99)
Potassium: 3.9 mmol/L (ref 3.5–5.1)
Sodium: 128 mmol/L — ABNORMAL LOW (ref 135–145)

## 2022-08-13 LAB — GLUCOSE, CAPILLARY
Glucose-Capillary: 121 mg/dL — ABNORMAL HIGH (ref 70–99)
Glucose-Capillary: 125 mg/dL — ABNORMAL HIGH (ref 70–99)
Glucose-Capillary: 125 mg/dL — ABNORMAL HIGH (ref 70–99)
Glucose-Capillary: 127 mg/dL — ABNORMAL HIGH (ref 70–99)
Glucose-Capillary: 128 mg/dL — ABNORMAL HIGH (ref 70–99)
Glucose-Capillary: 129 mg/dL — ABNORMAL HIGH (ref 70–99)

## 2022-08-13 LAB — HEPATIC FUNCTION PANEL
ALT: 21 U/L (ref 0–44)
AST: 13 U/L — ABNORMAL LOW (ref 15–41)
Albumin: 3.1 g/dL — ABNORMAL LOW (ref 3.5–5.0)
Alkaline Phosphatase: 81 U/L (ref 38–126)
Bilirubin, Direct: 0.8 mg/dL — ABNORMAL HIGH (ref 0.0–0.2)
Indirect Bilirubin: 1.6 mg/dL — ABNORMAL HIGH (ref 0.3–0.9)
Total Bilirubin: 2.4 mg/dL — ABNORMAL HIGH (ref 0.3–1.2)
Total Protein: 5.6 g/dL — ABNORMAL LOW (ref 6.5–8.1)

## 2022-08-13 LAB — LACTIC ACID, PLASMA: Lactic Acid, Venous: 1.2 mmol/L (ref 0.5–1.9)

## 2022-08-13 LAB — HEMOGLOBIN AND HEMATOCRIT, BLOOD
HCT: 23.2 % — ABNORMAL LOW (ref 36.0–46.0)
Hemoglobin: 7.4 g/dL — ABNORMAL LOW (ref 12.0–15.0)

## 2022-08-13 LAB — PREPARE RBC (CROSSMATCH)

## 2022-08-13 LAB — AMMONIA: Ammonia: 27 umol/L (ref 9–35)

## 2022-08-13 MED ORDER — HALOPERIDOL LACTATE 5 MG/ML IJ SOLN
2.0000 mg | Freq: Four times a day (QID) | INTRAMUSCULAR | Status: DC | PRN
  Administered 2022-08-13: 2 mg via INTRAVENOUS

## 2022-08-13 MED ORDER — HALOPERIDOL LACTATE 5 MG/ML IJ SOLN
INTRAMUSCULAR | Status: AC
  Filled 2022-08-13: qty 1

## 2022-08-13 MED ORDER — FUROSEMIDE 10 MG/ML IJ SOLN
40.0000 mg | Freq: Two times a day (BID) | INTRAMUSCULAR | Status: AC
  Administered 2022-08-13 (×2): 40 mg via INTRAVENOUS
  Filled 2022-08-13 (×2): qty 4

## 2022-08-13 MED ORDER — OXIDIZED CELLULOSE EX PADS
1.0000 | MEDICATED_PAD | Freq: Once | CUTANEOUS | Status: AC
  Administered 2022-08-13: 1 via TOPICAL
  Filled 2022-08-13: qty 1

## 2022-08-13 MED ORDER — SODIUM CHLORIDE 0.9 % IV SOLN: 2.0000 g | INTRAVENOUS | Status: DC

## 2022-08-13 MED ORDER — HYDROCORTISONE SOD SUC (PF) 100 MG IJ SOLR
100.0000 mg | Freq: Two times a day (BID) | INTRAMUSCULAR | Status: DC
  Administered 2022-08-13 – 2022-08-15 (×4): 100 mg via INTRAVENOUS
  Filled 2022-08-13 (×4): qty 2

## 2022-08-13 MED ORDER — ZIPRASIDONE MESYLATE 20 MG IM SOLR
10.0000 mg | INTRAMUSCULAR | Status: AC | PRN
  Administered 2022-08-14 (×2): 10 mg via INTRAMUSCULAR
  Filled 2022-08-13 (×3): qty 20

## 2022-08-13 MED ORDER — ALBUTEROL SULFATE (2.5 MG/3ML) 0.083% IN NEBU: 2.5000 mg | INHALATION_SOLUTION | RESPIRATORY_TRACT | Status: DC | PRN

## 2022-08-13 MED ORDER — "THROMBI-PAD 3""X3"" EX PADS"
1.0000 | MEDICATED_PAD | Freq: Once | CUTANEOUS | Status: DC
  Filled 2022-08-13: qty 1

## 2022-08-13 MED ORDER — ALBUMIN HUMAN 25 % IV SOLN
25.0000 g | Freq: Four times a day (QID) | INTRAVENOUS | Status: AC
  Administered 2022-08-13 – 2022-08-14 (×4): 25 g via INTRAVENOUS
  Filled 2022-08-13 (×4): qty 100

## 2022-08-13 MED ORDER — SODIUM CHLORIDE 0.9% IV SOLUTION: Freq: Once | INTRAVENOUS | Status: AC

## 2022-08-13 MED ORDER — HYDROMORPHONE HCL 1 MG/ML IJ SOLN
0.5000 mg | Freq: Three times a day (TID) | INTRAMUSCULAR | Status: DC | PRN
  Administered 2022-08-13 – 2022-08-14 (×2): 0.5 mg via INTRAVENOUS
  Filled 2022-08-13 (×2): qty 1

## 2022-08-13 MED ORDER — METHOCARBAMOL 500 MG PO TABS: 500.0000 mg | ORAL_TABLET | Freq: Three times a day (TID) | ORAL | Status: DC | PRN

## 2022-08-13 MED ORDER — SODIUM CHLORIDE 0.9 % IV SOLN
1.0000 g | Freq: Three times a day (TID) | INTRAVENOUS | Status: DC
  Administered 2022-08-13 – 2022-08-14 (×3): 1 g via INTRAVENOUS
  Filled 2022-08-13 (×4): qty 11.2

## 2022-08-13 MED ORDER — SODIUM BICARBONATE 650 MG PO TABS
650.0000 mg | ORAL_TABLET | Freq: Three times a day (TID) | ORAL | Status: DC
  Administered 2022-08-13 – 2022-08-14 (×3): 650 mg via ORAL
  Filled 2022-08-13 (×3): qty 1

## 2022-08-13 MED ORDER — DEXMEDETOMIDINE HCL IN NACL 200 MCG/50ML IV SOLN
0.0000 ug/kg/h | INTRAVENOUS | Status: DC
  Administered 2022-08-13: 0.04 ug/kg/h via INTRAVENOUS
  Administered 2022-08-13: 0.2 ug/kg/h via INTRAVENOUS
  Administered 2022-08-15: 0.6 ug/kg/h via INTRAVENOUS
  Administered 2022-08-15: 0.3 ug/kg/h via INTRAVENOUS
  Administered 2022-08-15 (×2): 0.5 ug/kg/h via INTRAVENOUS
  Administered 2022-08-15: 0.6 ug/kg/h via INTRAVENOUS
  Administered 2022-08-15 (×2): 0.5 ug/kg/h via INTRAVENOUS
  Administered 2022-08-15: 0.6 ug/kg/h via INTRAVENOUS
  Administered 2022-08-16: 0.3 ug/kg/h via INTRAVENOUS
  Administered 2022-08-16: 0.4 ug/kg/h via INTRAVENOUS
  Administered 2022-08-16: 0.3 ug/kg/h via INTRAVENOUS
  Administered 2022-08-16: 0.4 ug/kg/h via INTRAVENOUS
  Administered 2022-08-16 – 2022-08-17 (×2): 0.3 ug/kg/h via INTRAVENOUS
  Administered 2022-08-17: 0.2 ug/kg/h via INTRAVENOUS
  Filled 2022-08-13 (×4): qty 50
  Filled 2022-08-13: qty 100
  Filled 2022-08-13 (×5): qty 50
  Filled 2022-08-13: qty 100
  Filled 2022-08-13 (×4): qty 50

## 2022-08-13 NOTE — Progress Notes (Signed)
Pharmacy Antibiotic Note  Nataliee Shurtz is a 67 y.o. female admitted on 08/11/2022 with concern for sepsis, LLE cellulitis.  Pharmacy has been consulted for vancomycin and cefepime dosing.  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  Today, 08/13/2022: - Afebrile, WBC 21.3 (inc) , hypotensive - SCr increased from 2.1 >> 2.5 - PCT 16.2 (unreliable in CKD) - 08/11/2022 - CXR: no active disease - CT: LLE: Severe, diffuse, circumferential anasarca   Plan: Change Cefepime to 2g IV q24h  Follow up renal function, culture results, and clinical course.   Height: 5\' 2"  (157.5 cm) Weight: 124.7 kg (274 lb 14.6 oz) IBW/kg (Calculated) : 50.1  Temp (24hrs), Avg:97.6 F (36.4 C), Min:97.5 F (36.4 C), Max:97.7 F (36.5 C)  Recent Labs  Lab 08/11/22 0730 08/11/22 0739 08/11/22 0939 08/12/22 0325 08/12/22 0843 08/13/22 0335  WBC 6.2  --   --   --  17.2* 21.3*  CREATININE 2.41*  --   --  2.10*  --  2.50*  LATICACIDVEN  --  6.1* 4.2*  --   --   --     Estimated Creatinine Clearance: 28.7 mL/min (A) (by C-G formula based on SCr of 2.5 mg/dL (H)).    Allergies  Allergen Reactions   Ace Inhibitors Anaphylaxis and Swelling   Ivp Dye [Iodinated Contrast Media] Anaphylaxis   Chlorhexidine Rash and Other (See Comments)    WORSENS RASHES   Doxycycline Rash   Penicillins Rash   Zofran [Ondansetron] Rash    Antimicrobials this admission: 8/1 Meropenem x 1 8/1 Vancomycin >> 8/2 8/1 Clinda x1 8/1 Azithromycin >> (8/6) 8/1 cefepime >>   Dose adjustments this admission:   Microbiology results: 8/1 Resp panel: covid, flu, rsv: negative 8/1 BCx: BCx (2/4 bottles) growing unidentified GNR  8/1 UA: many bacteria, 21-50 epithelial cells, 21-50 WBC 8/1 UCx:  multiple species present; recollection suggested 8/1 MRSA PCR:  negative   Thank you for allowing pharmacy to be a part of this patient's care.  Terrilee Files, PharmD 08/13/2022 4:27 AM

## 2022-08-13 NOTE — Progress Notes (Addendum)
eLink Physician-Brief Progress Note Patient Name: [name] DOB: 09-02-1955 MRN: [id number]   Date of Service  08/13/2022  HPI/Events of Note  Patient drowsy and unable to take PO meds  eICU Interventions  OK to hold PO bicarb and rifaximin Order VBG > Compensated metabolic acidosis        Malka Bocek Mechele Collin 08/13/2022, 9:34 PM

## 2022-08-13 NOTE — Plan of Care (Signed)
°  Problem: Coping: °Goal: Level of anxiety will decrease °Outcome: Progressing °  °

## 2022-08-13 NOTE — Progress Notes (Signed)
eLink Physician-Brief Progress Note Patient Name: [name] DOB: 15-May-1955 MRN: [id number]   Date of Service  08/13/2022  HPI/Events of Note  RN reported SOB earlier in shift but now patient sleeping comfortably  Currently on RA. Has received volume resuscitation for hypotension. Anasarca with CVP 19-20s  eICU Interventions  PRN albuterol neb ordered  Hold on diuresis for now though will likely need it in the near future. Defer to day team         Mechele Collin 08/13/2022, 2:12 AM

## 2022-08-13 NOTE — Progress Notes (Addendum)
eLink Physician-Brief Progress Note Patient Name: [name] DOB: 18-Jun-1955 MRN: [id number]   Date of Service  08/13/2022  HPI/Events of Note  Agitated overnight. Haldol and Dilaudid reported minimally effective  On camera check, on precedex 0.2 and drowsy HR upper 40s, low 50s. Normotensive  Qtc 460s  eICU Interventions  -Hold on any additional meds now -No room to titrate Precedex. Continue current dose -Geodon 10 mg IM q2h PRN ordered with instructions to notify MD when given      Intervention Category Minor Interventions: Agitation / anxiety - evaluation and management   Mechele Collin 08/13/2022, 8:36 PM

## 2022-08-13 NOTE — Plan of Care (Signed)
  Problem: Education: Goal: Knowledge of General Education information will improve Description: Including pain rating scale, medication(s)/side effects and non-pharmacologic comfort measures Outcome: Progressing   Problem: Safety: Goal: Ability to remain free from injury will improve Outcome: Progressing   Problem: Skin Integrity: Goal: Risk for impaired skin integrity will decrease Outcome: Progressing   

## 2022-08-13 NOTE — Progress Notes (Signed)
NAME:  [name], MRN:  [id number], DOB:  1955-06-30, LOS: 2 ADMISSION DATE:  08/11/2022, CONSULTATION DATE:  08/11/22 REFERRING MD:  Adan Sis, PA, CHIEF COMPLAINT:  fatigue/ confusion   History of Present Illness:   67 year old female with PMH as below significant for NASH cirrhosis, HE, morbid obesity, chronic diarrhea, and MDR UTI (e. Coli and Klebsiella) who presents from home with complaints of left leg pain, fatigue, mild confusion, and recent N/V.   Patient lives with her son and cared for by her son, currently at bedside.  He provides most history.  Reports she has been doing better, starting to ambulate some with walker.  Voices some confusion over her diuretic regimen between what [facility], PCP, and prior hospital discharge instructions were.  Has only been taking lasix 20mg  and spironolactone daily.  Has not needed midodrine.  Reports several bouts of N/V on Tuesday, son suspects related to bad food, with ongoing poor PO intake since.  Developed left leg pain yesterday in which she went to urgent care for diagnosed with cellulitis and placed on keflex. Son reports they have a stray kitten at home who frequently scratches them.  Also, patient has frequent itching/ scratching of skin and sinus drainage with clear sputum for about one month.  Reports patient was able to ambulate to the car this morning with her walker.  No further N/V , changes in stool from her chronic diarrhea, no fever, chills, no concern for bleeding.  Denies urinary symptoms.   In ER, afebrile, SBP 70-90's, and room air sats 90 but since improved to normal on room air. Labs significant for normal WBC, H/H 7.5/ 24.6, plts 96, Na 130, sCr 2.41, BUN 37, bicarb 13, albumin 1.9, protein 5, normal LFTs except t. Bili near baseline 2.2, INR 3.4, lactic 6.1> 4.2, ammonia 24, PCT 16.  UA noted for trace leukocytes with 21-50 WBC, 21-50 squamous cells, many bacteria. CXR without acute process.  Cultures sent, treated with vancomycin,  meropenem, and clindamycin.  Given NS bolus with minimal improvement in SBP, therefore placed on low dose peripheral levophed.  Underwent imaging CT chest/ abd/ pelvis, CT left femur and tibia/ fibula found to have symmetric severe/ diffuse circumferential anasarca without focal fluid collection and noted new indeterminate wedge deformity of T5.  Son reports mental status is improved with improved blood pressure.  PCCM called for admit.    Pertinent  Medical History  NASH cirrhosis, HE, morbid obesity, PE not on AC (2014), IDA, GERD, HTN but on prn midodrine, short gut syndrome s/p right colectomy, end ileostomy, subsequent takedown of end ileostomy, small bowel resection in 2009, s/p cholecystectomy, chronic diarrhea, one episode of PAF during one hospitalization MDR UTI, former smoker  Significant Hospital Events: Including procedures, antibiotic start and stop dates in addition to other pertinent events   8/1 Admit with hypotension, possible sepsis/ cellulitis of left leg, AKI, anasarca  Interim History / Subjective:   No acute events overnight CVP ranging 17-23 She has waxing and waning mental status this morning.  Objective   Blood pressure 119/63, pulse 60, temperature 97.8 F (36.6 C), resp. rate 14, height 5\' 2"  (1.575 m), weight 124.7 kg, SpO2 95%. CVP:  [11 mmHg-42 mmHg] 21 mmHg      Intake/Output Summary (Last 24 hours) at 08/13/2022 0757 Last data filed at 08/13/2022 0703 Gross per 24 hour  Intake 969.26 ml  Output 500 ml  Net 469.26 ml   Filed Weights   08/11/22  1610 08/11/22 1320 08/12/22 0500  Weight: 106.6 kg 121.8 kg 124.7 kg   Examination: General:  chronically ill appearing morbidly obese female lying in bed, sleeping in NAD HEENT: MM pink/dry, R pupil 4/r, L opaque, anicteric Neuro: awakens easily to verbal stimuli but not oriented CV: rr, +murmur PULM:  non labored, clear anteriorly, diminished in bases GI: soft, bs+, some suprapubic tenderness,  purwick Extremities: warm, + edema Skin: erythema and warmth of left lower extremity - improved   Resolved Hospital Problem list    Assessment & Plan:   Shock- multifactorial> GNR bacteremia, LLE cellulitis, r/o UTI, and hypovolemic, +/- AI component - Also with hx of chronic hypotension, with prn midodrine at home - CT imaging not c/w with focal collection on left leg.  CXR/ CT chest neg for acute process  P: - cont to wean NE for MAP goal >65 - cont cefepime and azithro (for 5 days, to cover cat scratches/ bartonella.  - follow UC and final report on Bcs - continue midodrine 10mg  TID - cont stress dose steroids for now, reduced frequency to BID.  Inconclusive cortisol stem test in May, with recs then to f/u w/ OP endocrinology  - trend WBC/ fever curve  - left leg cellulitis improving clinically, cont to monitor closely  - minimal ascites on imaging, without abd pain/ tenderness, defer diagnostic paracentesis for now  AKI Hyponatremia NAGMA Lactic acidosis> improving Hypomagnesia  Hypoalbuminemia   - no obstruction or hydronephrosis on CT imaging - baseline ~ sCr 0.8 (May 2024) P:  - Cr elevated again today, oliguria - replete electrolytes as needed - trend renal indices  - strict I/Os, daily wts - avoid nephrotoxins, renal dose meds, hemodynamic support as above - start oral bicarb  NASH cirrhosis, advanced Coagulopathy, chronic  Anasarca  Chronic diarrhea  - MELD score 32 with 52.6% 3 month mortality.  Does not appear decompensated at this time, more related to sepsis and hypovolemia.  - ammonia at baseline.  No encephalopathy present  P:  - RUQ US unremarkable - cont lactulose 10gm BID > monitor stool/ chronic diarrhea output - cont rifaximin - INR not improving - follows with [facility]> plans/ needs EGD for variceal screening and recently referred to hepatology.     Chronic normocytic anemia and thrombocytopenia  - baseline H/H runs 7-9, admit Hgb 7.5.  -  T&S sent 8/1 P:  - cont SCDs for now - give 1 unit PRBCs for volume and hgb 7.1g/dL which has been down trending - likely from hematoma after PICC placement  Hx HTN HFpEF Aortic stenosis - prior TTE 06/2022> EF 60-65%, G1DD, mod AS P:  - start lasix 40mg  BID with albumin q6hrs x 4 doses   GERD - cont PPI    Hypothyroidism - cont synthroid   Depression - cont venlafaxine   Morbid obesity - encourage outpt wt loss evaluation   Thoracic spine, T5 wedge deformity - pt reports some chronic back pain, denies injury.  States follows with othro. F/u OP   GOC - son reports she has a DNR at home.  Pt verified DNR/ DNI status, ok with aggressive medical management otherwise.   Best Practice (right click and "Reselect all SmartList Selections" daily)   Diet/type: Regular consistency (see orders)> low salt DVT prophylaxis: SCD GI prophylaxis: PPI Lines: N/A Foley:  N/A Code Status:  DNR/ DNI Last date of multidisciplinary goals of care discussion [8/1]  Pt updated on plan of care.    Labs  CBC: Recent Labs  Lab 08/11/22 0730 08/12/22 0843 08/12/22 1512 08/13/22 0335  WBC 6.2 17.2*  --  21.3*  NEUTROABS 5.4  --   --   --   HGB 7.5* 7.5*  --  7.1*  HCT 24.6* 25.2*  --  22.7*  MCV 99.6 102.0*  --  97.8  PLT 96* 111* 129* 112*    Basic Metabolic Panel: Recent Labs  Lab 08/11/22 0730 08/12/22 0325 08/13/22 0335  NA 130* 129* 128*  K 3.7 4.6 4.5  CL 103 103 103  CO2 13* 17* 16*  GLUCOSE 108* 126* 144*  BUN 37* 41* 49*  CREATININE 2.41* 2.10* 2.50*  CALCIUM 7.8* 8.3* 7.8*  MG 1.4* 1.9 2.0   GFR: Estimated Creatinine Clearance: 28.7 mL/min (A) (by C-G formula based on SCr of 2.5 mg/dL (H)). Recent Labs  Lab 08/11/22 0730 08/11/22 0739 08/11/22 0939 08/12/22 0843 08/13/22 0335  PROCALCITON 16.20  --   --   --   --   WBC 6.2  --   --  17.2* 21.3*  LATICACIDVEN  --  6.1* 4.2*  --   --     Liver Function Tests: Recent Labs  Lab 08/11/22 0730  08/12/22 0325  AST 32 22  ALT 28 25  ALKPHOS 98 86  BILITOT 2.2* 3.0*  PROT 5.0* 5.6*  ALBUMIN 1.9* 2.8*   No results for input(s): "LIPASE", "AMYLASE" in the last 168 hours. Recent Labs  Lab 08/11/22 0730  AMMONIA 24    ABG    Component Value Date/Time   HCO3 21.7 06/12/2022 2145   TCO2 14 (L) 04/30/2022 2034   ACIDBASEDEF 2.5 (H) 06/12/2022 2145   O2SAT 77.4 06/12/2022 2145     Coagulation Profile: Recent Labs  Lab 08/11/22 0730 08/12/22 0325 08/12/22 1512 08/13/22 0335  INR 3.4* 2.3* 3.4* 3.2*    Cardiac Enzymes: No results for input(s): "CKTOTAL", "CKMB", "CKMBINDEX", "TROPONINI" in the last 168 hours.  HbA1C: No results found for: "HGBA1C"  CBG: Recent Labs  Lab 08/12/22 0853 08/12/22 1139 08/12/22 2005 08/12/22 2312 08/13/22 0411  GLUCAP 110* 136* 153* 141* 127*    Critical care time: 40 mins     Melody Comas, MD [facility] Office: [phone]   See Amion for personal pager PCCM on call pager [phone] until 7pm. Please call Elink 7p-7a. [phone]

## 2022-08-14 DIAGNOSIS — R579 Shock, unspecified: Secondary | ICD-10-CM

## 2022-08-14 DIAGNOSIS — I959 Hypotension, unspecified: Secondary | ICD-10-CM | POA: Diagnosis not present

## 2022-08-14 DIAGNOSIS — A419 Sepsis, unspecified organism: Secondary | ICD-10-CM | POA: Diagnosis not present

## 2022-08-14 DIAGNOSIS — R6521 Severe sepsis with septic shock: Secondary | ICD-10-CM | POA: Diagnosis not present

## 2022-08-14 LAB — CBC WITH DIFFERENTIAL/PLATELET
Abs Immature Granulocytes: 0.02 10*3/uL (ref 0.00–0.07)
Basophils Absolute: 0 10*3/uL (ref 0.0–0.1)
Basophils Relative: 0 %
Eosinophils Absolute: 0 10*3/uL (ref 0.0–0.5)
Eosinophils Relative: 0 %
HCT: 18.2 % — ABNORMAL LOW (ref 36.0–46.0)
Hemoglobin: 5.8 g/dL — CL (ref 12.0–15.0)
Immature Granulocytes: 0 %
Lymphocytes Relative: 9 %
Lymphs Abs: 0.5 10*3/uL — ABNORMAL LOW (ref 0.7–4.0)
MCH: 30.1 pg (ref 26.0–34.0)
MCHC: 31.9 g/dL (ref 30.0–36.0)
MCV: 94.3 fL (ref 80.0–100.0)
Monocytes Absolute: 0.6 10*3/uL (ref 0.1–1.0)
Monocytes Relative: 10 %
Neutro Abs: 5 10*3/uL (ref 1.7–7.7)
Neutrophils Relative %: 81 %
Platelets: 53 10*3/uL — ABNORMAL LOW (ref 150–400)
RBC: 1.93 MIL/uL — ABNORMAL LOW (ref 3.87–5.11)
RDW: 15.9 % — ABNORMAL HIGH (ref 11.5–15.5)
WBC: 6.2 10*3/uL (ref 4.0–10.5)
nRBC: 0.6 % — ABNORMAL HIGH (ref 0.0–0.2)

## 2022-08-14 LAB — DIC (DISSEMINATED INTRAVASCULAR COAGULATION)PANEL
D-Dimer, Quant: 2.58 ug/mL-FEU — ABNORMAL HIGH (ref 0.00–0.50)
Fibrinogen: 238 mg/dL (ref 210–475)
INR: 2.1 — ABNORMAL HIGH (ref 0.8–1.2)
Platelets: 74 10*3/uL — ABNORMAL LOW (ref 150–400)
Prothrombin Time: 24.1 seconds — ABNORMAL HIGH (ref 11.4–15.2)
Smear Review: NONE SEEN
aPTT: 45 seconds — ABNORMAL HIGH (ref 24–36)

## 2022-08-14 LAB — COMPREHENSIVE METABOLIC PANEL WITH GFR
ALT: 18 U/L (ref 0–44)
AST: 13 U/L — ABNORMAL LOW (ref 15–41)
Albumin: 3.8 g/dL (ref 3.5–5.0)
Alkaline Phosphatase: 62 U/L (ref 38–126)
Anion gap: 11 (ref 5–15)
BUN: 63 mg/dL — ABNORMAL HIGH (ref 8–23)
CO2: 16 mmol/L — ABNORMAL LOW (ref 22–32)
Calcium: 7.7 mg/dL — ABNORMAL LOW (ref 8.9–10.3)
Chloride: 103 mmol/L (ref 98–111)
Creatinine, Ser: 2.7 mg/dL — ABNORMAL HIGH (ref 0.44–1.00)
GFR, Estimated: 19 mL/min — ABNORMAL LOW (ref >60)
Glucose, Bld: 141 mg/dL — ABNORMAL HIGH (ref 70–99)
Potassium: 4.1 mmol/L (ref 3.5–5.1)
Sodium: 130 mmol/L — ABNORMAL LOW (ref 135–145)
Total Bilirubin: 2.5 mg/dL — ABNORMAL HIGH (ref 0.3–1.2)
Total Protein: 6.1 g/dL — ABNORMAL LOW (ref 6.5–8.1)

## 2022-08-14 LAB — GLUCOSE, CAPILLARY
Glucose-Capillary: 123 mg/dL — ABNORMAL HIGH (ref 70–99)
Glucose-Capillary: 121 mg/dL — ABNORMAL HIGH (ref 70–99)
Glucose-Capillary: 121 mg/dL — ABNORMAL HIGH (ref 70–99)
Glucose-Capillary: 110 mg/dL — ABNORMAL HIGH (ref 70–99)
Glucose-Capillary: 101 mg/dL — ABNORMAL HIGH (ref 70–99)
Glucose-Capillary: 102 mg/dL — ABNORMAL HIGH (ref 70–99)

## 2022-08-14 LAB — PREPARE PLATELET PHERESIS: Unit division: 0

## 2022-08-14 LAB — CBC
HCT: 18.6 % — ABNORMAL LOW (ref 36.0–46.0)
HCT: 23.8 % — ABNORMAL LOW (ref 36.0–46.0)
Hemoglobin: 6 g/dL — CL (ref 12.0–15.0)
Hemoglobin: 7.8 g/dL — ABNORMAL LOW (ref 12.0–15.0)
MCH: 30 pg (ref 26.0–34.0)
MCH: 30.4 pg (ref 26.0–34.0)
MCHC: 32.3 g/dL (ref 30.0–36.0)
MCHC: 32.8 g/dL (ref 30.0–36.0)
MCV: 93 fL (ref 80.0–100.0)
MCV: 92.6 fL (ref 80.0–100.0)
Platelets: 54 10*3/uL — ABNORMAL LOW (ref 150–400)
Platelets: 80 10*3/uL — ABNORMAL LOW (ref 150–400)
RBC: 2 MIL/uL — ABNORMAL LOW (ref 3.87–5.11)
RBC: 2.57 MIL/uL — ABNORMAL LOW (ref 3.87–5.11)
RDW: 15.9 % — ABNORMAL HIGH (ref 11.5–15.5)
RDW: 15.5 % (ref 11.5–15.5)
WBC: 6.5 10*3/uL (ref 4.0–10.5)
WBC: 9.7 10*3/uL (ref 4.0–10.5)
nRBC: 0.5 % — ABNORMAL HIGH (ref 0.0–0.2)
nRBC: 0.6 % — ABNORMAL HIGH (ref 0.0–0.2)

## 2022-08-14 LAB — BPAM PLATELET PHERESIS
Blood Product Expiration Date: 202408052359
ISSUE DATE / TIME: 202408041127
Unit Type and Rh: 5100

## 2022-08-14 LAB — PROTIME-INR
INR: 2.5 — ABNORMAL HIGH (ref 0.8–1.2)
Prothrombin Time: 27.2 s — ABNORMAL HIGH (ref 11.4–15.2)

## 2022-08-14 LAB — PREPARE RBC (CROSSMATCH)

## 2022-08-14 LAB — CREATININE, URINE, RANDOM: Creatinine, Urine: 64 mg/dL

## 2022-08-14 LAB — SODIUM, URINE, RANDOM: Sodium, Ur: <10 mmol/L

## 2022-08-14 MED ORDER — DEXTROSE 5 % IV SOLN
250.0000 mg | Freq: Every day | INTRAVENOUS | Status: DC
  Filled 2022-08-14: qty 2.5

## 2022-08-14 MED ORDER — SODIUM CHLORIDE 0.9% IV SOLUTION: Freq: Once | INTRAVENOUS | Status: AC

## 2022-08-14 MED ORDER — ORAL CARE MOUTH RINSE
15.0000 mL | OROMUCOSAL | Status: DC
  Administered 2022-08-14 – 2022-08-18 (×18): 15 mL via OROMUCOSAL

## 2022-08-14 MED ORDER — ORAL CARE MOUTH RINSE: 15.0000 mL | OROMUCOSAL | Status: DC | PRN

## 2022-08-14 MED ORDER — PANTOPRAZOLE SODIUM 40 MG IV SOLR
40.0000 mg | Freq: Two times a day (BID) | INTRAVENOUS | Status: DC
  Administered 2022-08-14 – 2022-08-19 (×10): 40 mg via INTRAVENOUS
  Filled 2022-08-14 (×10): qty 10

## 2022-08-14 MED ORDER — STERILE WATER FOR INJECTION IJ SOLN
INTRAMUSCULAR | Status: AC
  Administered 2022-08-14: 10 mL
  Filled 2022-08-14: qty 10

## 2022-08-14 MED ORDER — SODIUM CHLORIDE 0.9 % IV SOLN
1.0000 g | Freq: Two times a day (BID) | INTRAVENOUS | Status: DC
  Administered 2022-08-14 – 2022-08-20 (×12): 1 g via INTRAVENOUS
  Filled 2022-08-14 (×13): qty 20

## 2022-08-14 MED ORDER — SODIUM CHLORIDE 0.9 % IV SOLN
500.0000 mg | Freq: Every day | INTRAVENOUS | Status: DC
  Administered 2022-08-14 – 2022-08-15 (×2): 500 mg via INTRAVENOUS
  Filled 2022-08-14 (×2): qty 5

## 2022-08-14 MED ORDER — ZINC OXIDE 40 % EX OINT
TOPICAL_OINTMENT | CUTANEOUS | Status: DC | PRN
  Filled 2022-08-14 (×4): qty 57

## 2022-08-14 NOTE — Progress Notes (Signed)
eLink Physician-Brief Progress Note Patient Name: [name] DOB: 1955-11-28 MRN: [id number]   Date of Service  08/14/2022  HPI/Events of Note  To follow-up p.m. labs on signout. Patient also reported to be agitated despite receiving a second dose of Geodon and being on dexmedetomidine.  eICU Interventions  Labs reviewed.  Bump in hemoglobin from 5.8 to over 7. Chart reviewed.  Video exam done.  Patient sleeping comfortably.  No further intervention for agitation. Updated charge nurse.   12:35 AM: Patient agitated again.  Video assessment done.  She is drowsy but has occasional periods of agitation.  Will increase dexmedetomidine dose from 0.2-0.4.  Discussed with bedside nurse.  Intervention Category Major Interventions: Delirium, psychosis, severe agitation - evaluation and management  Carilyn Goodpasture 08/14/2022, 9:46 PM

## 2022-08-14 NOTE — Consult Note (Signed)
[facility]                                                                                        Patient Identification: Patient Name: [name] MRN: [id number] Admit Date: 08/11/2022  6:24 AM Today's Date: 08/14/2022 Reason for consult: Flavobacterium odoratum bacteremia Requesting provider: Dr. 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 MDR sensitivity profile reviewed and carbapenem R # LLE cellulitis  # h/o new stray unvaccinated kitten at home with h/o scratching with concerns of cat scratch on admission   # NASH cirrhosis with coagulopathy/chronic thrombocytopenia - no signs and symptoms of SBP, paracentesis deferred   Recommendations  DC Vancomycin  Continue cefiderocol  until sensi 's back for flavobacterium. Known to be extensively drug resistant bacteria per literature review. Considered using minocycline and tigecycline but per pharm D no IV minocycline available as well as h/o rash with doxycyline noted and hence, deferred. Will also need to send further sensitivities to [facility] once sensitivities here are resulted.  ? Cat scratch disease and can complete 5 days course of azithromycin, already on D 4.  Fu repeat blood cx   Addendum: sensitivities reviewed, will switch back cefiderocol to meropenem   Rest of the management as per the primary team. Please call with questions or concerns.  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  She was at the urgent care for leg cellulitis prior to ED visit and was given cephalexin. Reportedly she had a new unvaccinated kitten at home that has scratched her. She also continues to scratch.   At ED afebrile, hypertensive Labs remarkable for NA 130, AKI with creatinine 2.41, TB 2.2, lactic acid 6.1, WBC 6.2, platelets 96 Influenza A/influenza B/RSV/SARS-CoV-2 negative 8/1 UA with trace leukocytes, many bacteria, culture with multiple species 8/1 blood culture 2/2 sets double Flavobactrium odoratum  Patient was admitted to ICU for septic shock requiring vasopressors  ROS: Unavailable as patient is coming off of Precedex  Past Medical History:  Diagnosis Date   Chronic diarrhea    Cirrhosis, 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   pt stated on admission history review   Heart murmur    Hypertension    Kidney stone    Obesity    Pulmonary embolism (HCC)    Short gut syndrome    Thyroid disease    Past Surgical History:  Procedure Laterality Date   CHOLECYSTECTOMY     COLON  SURGERY     HERNIA REPAIR     ILEOSTOMY     ILEOSTOMY CLOSURE     KIDNEY STONE SURGERY     WRIST SURGERY      Scheduled Meds:  sodium chloride   Intravenous Once   sodium chloride   Intravenous Once   azithromycin  250 mg Oral Daily   hydrocortisone sod succinate (SOLU-CORTEF) inj  100 mg Intravenous Q12H   midodrine  15 mg Oral TID WC   rifaximin  550 mg Oral BID   sodium bicarbonate  650 mg Oral TID   sodium chloride flush  10-40 mL Intracatheter Q12H   Continuous Infusions:  sodium chloride 250 mL  (08/11/22 1354)   cefiderocol 37.1 mL/hr at 08/14/22 0739   dexmedetomidine (PRECEDEX) IV infusion Stopped (08/14/22 0733)   norepinephrine (LEVOPHED) Adult infusion 1 mcg/min (08/14/22 0739)   promethazine (PHENERGAN) injection (IM or IVPB) Stopped (08/13/22 0540)   PRN Meds:.acetaminophen, albuterol, HYDROmorphone (DILAUDID) injection, methocarbamol, mouth rinse, oxyCODONE, polyethylene glycol, promethazine (PHENERGAN) injection (IM or IVPB), sodium chloride flush, ziprasidone  Allergies  Allergen Reactions   Ace Inhibitors Anaphylaxis and Swelling   Ivp Dye [Iodinated Contrast Media] Anaphylaxis   Chlorhexidine Rash and Other (See Comments)    WORSENS RASHES   Doxycycline Rash   Penicillins Rash   Zofran [Ondansetron] Rash   Social History   Socioeconomic History   Marital status: Single    Spouse name: Not on file   Number of children: 1   Years of education: Not on file   Highest education level: Not on file  Occupational History   Not on file  Tobacco Use   Smoking status: Former    Current packs/day: 0.00    Types: Cigarettes    Quit date: 62    Years since quitting: 37.6    Passive exposure: Past   Smokeless tobacco: Never  Vaping Use   Vaping status: Never Used  Substance and Sexual Activity   Alcohol use: No   Drug use: No   Sexual activity: Not Currently    Birth control/protection: Post-menopausal  Other Topics Concern   Not on file  Social History Narrative   Not on file   Social Determinants of Health   Financial Resource Strain: Not on file  Food Insecurity: No Food Insecurity (08/11/2022)   Hunger Vital Sign    Worried About Running Out of Food in the Last Year: Never true    Ran Out of Food in the Last Year: Never true  Transportation Needs: No Transportation Needs (08/11/2022)   PRAPARE - Administrator, Civil Service (Medical): No    Lack of Transportation (Non-Medical): No  Physical Activity: Not on file  Stress: Not on file   Social Connections: Not on file  Intimate Partner Violence: Not At Risk (08/11/2022)   Humiliation, Afraid, Rape, and Kick questionnaire    Fear of Current or Ex-Partner: No    Emotionally Abused: No    Physically Abused: No    Sexually Abused: No   History reviewed. No pertinent family history.  Vitals BP (!) 118/58 (BP Location: Left Arm)   Pulse 62   Temp (!) 96.4 F (35.8 C) (Axillary)   Resp 13   Ht 5\' 2"  (1.575 m)   Wt 124.7 kg   SpO2 99%   BMI 50.28 kg/m    Physical Exam Constitutional: Chronically ill-appearing morbidly obese patient lying in the bed, on low-dose Levophed    Comments:   Cardiovascular:  Rate and Rhythm: Normal rate and regular rhythm.     Heart sounds: s1s2   Pulmonary:     Effort: Pulmonary effort is normal on room air     Comments: Normal breath sounds  Abdominal:     Palpations: Abdomen is soft.     Tenderness: Nondistended and nontender  Musculoskeletal:        General: No swelling or tenderness in peripheral joints.  Receding erythema in the left calf and posterior thigh with some bruises.  No crepitus or fluctuance.  Limited exam currently   Skin:    Comments: as above   Neurological:     General: She is coming off of Precedex and does not follow command. D/w RN   Pertinent Microbiology Results for orders placed or performed during the hospital encounter of 08/11/22  Resp panel by RT-PCR (RSV, Flu A&B, Covid) Anterior Nasal Swab     Status: None   Collection Time: 08/11/22  7:30 AM   Specimen: Anterior Nasal Swab  Result Value Ref Range Status   SARS Coronavirus 2 by RT PCR NEGATIVE NEGATIVE Final    Comment: (NOTE) SARS-CoV-2 target nucleic acids are NOT DETECTED.  The SARS-CoV-2 RNA is generally detectable in upper respiratory specimens during the acute phase of infection. The lowest concentration of SARS-CoV-2 viral copies this assay can detect is 138 copies/mL. A negative result does not preclude SARS-Cov-2 infection  and should not be used as the sole basis for treatment or other patient management decisions. A negative result may occur with  improper specimen collection/handling, submission of specimen other than nasopharyngeal swab, presence of viral mutation(s) within the areas targeted by this assay, and inadequate number of viral copies(<138 copies/mL). A negative result must be combined with clinical observations, patient history, and epidemiological information. The expected result is Negative.  Fact Sheet for Patients:  BloggerCourse.com  Fact Sheet for Healthcare Providers:  SeriousBroker.it  This test is no t yet approved or cleared by the [place] FDA and  has been authorized for detection and/or diagnosis of SARS-CoV-2 by FDA under an Emergency Use Authorization (EUA). This EUA will remain  in effect (meaning this test can be used) for the duration of the COVID-19 declaration under Section 564(b)(1) of the Act, 21 U.S.C.section 360bbb-3(b)(1), unless the authorization is terminated  or revoked sooner.       Influenza A by PCR NEGATIVE NEGATIVE Final   Influenza B by PCR NEGATIVE NEGATIVE Final    Comment: (NOTE) The Xpert Xpress SARS-CoV-2/FLU/RSV plus assay is intended as an aid in the diagnosis of influenza from Nasopharyngeal swab specimens and should not be used as a sole basis for treatment. Nasal washings and aspirates are unacceptable for Xpert Xpress SARS-CoV-2/FLU/RSV testing.  Fact Sheet for Patients: BloggerCourse.com  Fact Sheet for Healthcare Providers: SeriousBroker.it  This test is not yet approved or cleared by the [place] FDA and has been authorized for detection and/or diagnosis of SARS-CoV-2 by FDA under an Emergency Use Authorization (EUA). This EUA will remain in effect (meaning this test can be used) for the duration of the COVID-19 declaration  under Section 564(b)(1) of the Act, 21 U.S.C. section 360bbb-3(b)(1), unless the authorization is terminated or revoked.     Resp Syncytial Virus by PCR NEGATIVE NEGATIVE Final    Comment: (NOTE) Fact Sheet for Patients: BloggerCourse.com  Fact Sheet for Healthcare Providers: SeriousBroker.it  This test is not yet approved or cleared by the [place] FDA and has been authorized for detection and/or  diagnosis of SARS-CoV-2 by FDA under an Emergency Use Authorization (EUA). This EUA will remain in effect (meaning this test can be used) for the duration of the COVID-19 declaration under Section 564(b)(1) of the Act, 21 U.S.C. section 360bbb-3(b)(1), unless the authorization is terminated or revoked.  Performed at [facility], [address], [place], [place] [address]   Blood Culture (routine x 2)     Status: Abnormal (Preliminary result)   Collection Time: 08/11/22  7:30 AM   Specimen: BLOOD  Result Value Ref Range Status   Specimen Description   Final    BLOOD RIGHT ANTECUBITAL Performed at [facility], [address], [place], [place] [address]    Special Requests   Final    BOTTLES DRAWN AEROBIC AND ANAEROBIC Blood Culture results may not be optimal due to an excessive volume of blood received in culture bottles Performed at [facility], [address], [place], [place] [address]    Culture  Setup Time   Final    GRAM NEGATIVE RODS AEROBIC BOTTLE ONLY CRITICAL RESULT CALLED TO, READ BACK BY AND VERIFIED WITH: D [email or web address]  08/12/22 MK Performed at [facility], [address] [place], [place] [address]    Culture FLAVOBACTERIUM ODORATUM (A)  Final   Report Status PENDING  Incomplete  Blood Culture ID Panel (Reflexed)     Status: None   Collection Time: 08/11/22  7:30 AM  Result Value Ref Range Status   Enterococcus faecalis NOT DETECTED NOT  DETECTED Final   Enterococcus Faecium NOT DETECTED NOT DETECTED Final   Listeria monocytogenes NOT DETECTED NOT DETECTED Final   Staphylococcus species NOT DETECTED NOT DETECTED Final   Staphylococcus aureus (BCID) NOT DETECTED NOT DETECTED Final   Staphylococcus epidermidis NOT DETECTED NOT DETECTED Final   Staphylococcus lugdunensis NOT DETECTED NOT DETECTED Final   Streptococcus species NOT DETECTED NOT DETECTED Final   Streptococcus agalactiae NOT DETECTED NOT DETECTED Final   Streptococcus pneumoniae NOT DETECTED NOT DETECTED Final   Streptococcus pyogenes NOT DETECTED NOT DETECTED Final   A.calcoaceticus-baumannii NOT DETECTED NOT DETECTED Final   Bacteroides fragilis NOT DETECTED NOT DETECTED Final   Enterobacterales NOT DETECTED NOT DETECTED Final   Enterobacter cloacae complex NOT DETECTED NOT DETECTED Final   Escherichia coli NOT DETECTED NOT DETECTED Final   Klebsiella aerogenes NOT DETECTED NOT DETECTED Final   Klebsiella oxytoca NOT DETECTED NOT DETECTED Final   Klebsiella pneumoniae NOT DETECTED NOT DETECTED Final   Proteus species NOT DETECTED NOT DETECTED Final   Salmonella species NOT DETECTED NOT DETECTED Final   Serratia marcescens NOT DETECTED NOT DETECTED Final   Haemophilus influenzae NOT DETECTED NOT DETECTED Final   Neisseria meningitidis NOT DETECTED NOT DETECTED Final   Pseudomonas aeruginosa NOT DETECTED NOT DETECTED Final   Stenotrophomonas maltophilia NOT DETECTED NOT DETECTED Final   Candida albicans NOT DETECTED NOT DETECTED Final   Candida auris NOT DETECTED NOT DETECTED Final   Candida glabrata NOT DETECTED NOT DETECTED Final   Candida krusei NOT DETECTED NOT DETECTED Final   Candida parapsilosis NOT DETECTED NOT DETECTED Final   Candida tropicalis NOT DETECTED NOT DETECTED Final   Cryptococcus neoformans/gattii NOT DETECTED NOT DETECTED Final    Comment: Performed at [facility], [address] [place], [place] [address]  Urine Culture (for  pregnant, neutropenic or urologic patients or patients with an indwelling urinary catheter)     Status: Abnormal   Collection Time: 08/11/22  7:50  AM   Specimen: Urine, Clean Catch  Result Value Ref Range Status   Specimen Description   Final    URINE, CLEAN CATCH Performed at [facility], [address], [place], [place] [address]    Special Requests   Final    NONE Performed at [facility], [address] [place], [place] [address]    Culture MULTIPLE SPECIES PRESENT, SUGGEST RECOLLECTION (A)  Final   Report Status 08/12/2022 FINAL  Final  Blood Culture (routine x 2)     Status: Abnormal (Preliminary result)   Collection Time: 08/11/22  8:01 AM   Specimen: BLOOD  Result Value Ref Range Status   Specimen Description   Final    BLOOD LEFT ANTECUBITAL Performed at [facility], [address], [place], [place] [address]    Special Requests   Final    BOTTLES DRAWN AEROBIC AND ANAEROBIC Blood Culture adequate volume Performed at [facility], [address] [place], [place] [address]    Culture  Setup Time   Final    GRAM NEGATIVE RODS AEROBIC BOTTLE ONLY CRITICAL VALUE NOTED.  VALUE IS CONSISTENT WITH PREVIOUSLY REPORTED AND CALLED VALUE. Performed at [facility], [address], [place], [place] [address]    Culture FLAVOBACTERIUM ODORATUM (A)  Final   Report Status PENDING  Incomplete  MRSA Next Gen by PCR, Nasal     Status: None   Collection Time: 08/11/22  2:22 PM   Specimen: Nasal Mucosa; Nasal Swab  Result Value Ref Range Status   MRSA by PCR Next Gen NOT DETECTED NOT DETECTED Final    Comment: (NOTE) The GeneXpert MRSA Assay (FDA approved for NASAL specimens only), is one component of a comprehensive MRSA colonization surveillance program. It is not intended to diagnose MRSA infection nor to guide or monitor treatment for MRSA infections. Test performance is not FDA approved in  patients less than 34 years old. Performed at [facility], [address], [place], [place] [address]   Culture, blood (Routine X 2) w Reflex to ID Panel     Status: None (Preliminary result)   Collection Time: 08/12/22 10:20 AM   Specimen: BLOOD  Result Value Ref Range Status   Specimen Description   Final    BLOOD SITE NOT SPECIFIED Performed at [facility], [address] [place], [place] [address]    Special Requests   Final    BOTTLES DRAWN AEROBIC AND ANAEROBIC Blood Culture adequate volume Performed at [facility], [address] [place], [place] [address]    Culture   Final    NO GROWTH 2 DAYS Performed at [facility], [address], [place], [place] [address]    Report Status PENDING  Incomplete  Culture, blood (Routine X 2) w Reflex to ID Panel     Status: None (Preliminary result)   Collection Time: 08/12/22 11:46 AM   Specimen: BLOOD RIGHT HAND  Result Value Ref Range Status   Specimen Description   Final    BLOOD RIGHT HAND Performed at [facility], [address] [place], [place] [address]    Special Requests   Final    BOTTLES DRAWN AEROBIC ONLY Blood Culture results may not be optimal due to an inadequate volume of blood received in culture bottles Performed at [facility], [address] [place], [place] [address]    Culture   Final  NO GROWTH 2 DAYS Performed at [facility], [address], [place], [place] [address]    Report Status PENDING  Incomplete   Pertinent Lab seen by me:    Latest Ref Rng & Units 08/14/2022    6:30 AM 08/13/2022    7:00 PM 08/13/2022    1:10 PM  CBC  WBC 4.0 - 10.5 K/uL 6.5  19.7    Hemoglobin 12.0 - 15.0 g/dL 6.0  7.1  7.4   Hematocrit 36.0 - 46.0 % 18.6  22.3  23.2   Platelets 150 - 400 K/uL 54  95        Latest Ref Rng & Units 08/14/2022    6:30 AM 08/13/2022    7:00 PM 08/13/2022    9:30 AM  CMP  Glucose  70 - 99 mg/dL 440  347    BUN 8 - 23 mg/dL 63  54    Creatinine 4.25 - 1.00 mg/dL 9.56  3.87    Sodium 564 - 145 mmol/L 130  128    Potassium 3.5 - 5.1 mmol/L 4.1  3.9    Chloride 98 - 111 mmol/L 103  102    CO2 22 - 32 mmol/L 16  16    Calcium 8.9 - 10.3 mg/dL 7.7  7.7    Total Protein 6.5 - 8.1 g/dL 6.1   5.6   Total Bilirubin 0.3 - 1.2 mg/dL 2.5   2.4   Alkaline Phos 38 - 126 U/L 62   81   AST 15 - 41 U/L 13   13   ALT 0 - 44 U/L 18   21     Pertinent Imagings/Other Imagings Plain films and CT images have been personally visualized and interpreted; radiology reports have been reviewed. Decision making incorporated into the Impression / Recommendations.  VAS [place] LOWER EXTREMITY VENOUS (DVT)  Result Date: 08/13/2022  Lower Venous DVT Study Patient Name:  JALIA ZUNIGA  Date of Exam:   08/12/2022 Medical Rec #: [id number]       Accession #:    [id number] Date of Birth: 05/08/1955        Patient Gender: F Patient Age:   49 years Exam Location:  [facility] Procedure:      VAS [place] LOWER EXTREMITY VENOUS (DVT) Referring Phys: Melody Comas --------------------------------------------------------------------------------  Indications: Leg pain and edema, LT>RT.  Limitations: Body habitus and patient pain/tension with compression. Comparison Study: 05-14-2021 Prior lower extremity venous study was negative for                   DVT. Performing Technologist: Jean Rosenthal RDMS, RVT  Examination Guidelines: A complete evaluation includes B-mode imaging, spectral Doppler, color Doppler, and power Doppler as needed of all accessible portions of each vessel. Bilateral testing is considered an integral part of a complete examination. Limited examinations for reoccurring indications may be performed as noted. The reflux portion of the exam is performed with the patient in reverse Trendelenburg.  +---------+---------------+---------+-----------+----------+-------------------+ RIGHT     CompressibilityPhasicitySpontaneityPropertiesThrombus Aging      +---------+---------------+---------+-----------+----------+-------------------+ CFV      Full           Yes      Yes                                      +---------+---------------+---------+-----------+----------+-------------------+ SFJ      Full                                                             +---------+---------------+---------+-----------+----------+-------------------+  FV Prox  Full                                                             +---------+---------------+---------+-----------+----------+-------------------+ FV Mid   Full           Yes      Yes                                      +---------+---------------+---------+-----------+----------+-------------------+ FV DistalFull           Yes      Yes                                      +---------+---------------+---------+-----------+----------+-------------------+ PFV      Full                                                             +---------+---------------+---------+-----------+----------+-------------------+ POP      Full           Yes      Yes                                      +---------+---------------+---------+-----------+----------+-------------------+ PTV      Full                                                             +---------+---------------+---------+-----------+----------+-------------------+ PERO                                                  Not well visualized +---------+---------------+---------+-----------+----------+-------------------+   +---------+---------------+---------+-----------+----------+--------------+ LEFT     CompressibilityPhasicitySpontaneityPropertiesThrombus Aging +---------+---------------+---------+-----------+----------+--------------+ CFV      Full           Yes      Yes                                  +---------+---------------+---------+-----------+----------+--------------+ SFJ      Full                                                        +---------+---------------+---------+-----------+----------+--------------+ FV Prox  Full                                                        +---------+---------------+---------+-----------+----------+--------------+  FV Mid   Full                                                        +---------+---------------+---------+-----------+----------+--------------+ FV DistalFull                                                        +---------+---------------+---------+-----------+----------+--------------+ PFV      Full                                                        +---------+---------------+---------+-----------+----------+--------------+ POP      Full           Yes      Yes                                 +---------+---------------+---------+-----------+----------+--------------+ PTV      Full                                                        +---------+---------------+---------+-----------+----------+--------------+ PERO     Full                                                        +---------+---------------+---------+-----------+----------+--------------+     Summary: RIGHT: - There is no evidence of deep vein thrombosis in the lower extremity. However, portions of this examination were limited- see technologist comments above.  - No cystic structure found in the popliteal fossa.  LEFT: - There is no evidence of deep vein thrombosis in the lower extremity. However, portions of this examination were limited- see technologist comments above.  - No cystic structure found in the popliteal fossa.  *See table(s) above for measurements and observations. Electronically signed by Heath Lark on 08/13/2022 at 1:18:45 PM.    Final    DG Chest Port 1 View  Result Date: 08/12/2022 CLINICAL DATA:  Central line  placement EXAM: PORTABLE CHEST 1 VIEW COMPARISON:  Chest x-ray 08/11/2022 FINDINGS: There is a new right-sided central venous catheter with distal tip projecting over the SVC. The heart is enlarged there central pulmonary vascular congestion. There is no pleural effusion or pneumothorax. No acute fractures are seen. IMPRESSION: New right-sided central venous catheter with distal tip projecting over the SVC. No pneumothorax. Electronically Signed   By: Darliss Cheney M.D.   On: 08/12/2022 15:51   US Abdomen Limited RUQ (LIVER/GB)  Result Date: 08/12/2022 CLINICAL DATA:  Abnormal liver function tests EXAM: ULTRASOUND ABDOMEN LIMITED RIGHT UPPER QUADRANT COMPARISON:  CT 08/11/2022 FINDINGS: Gallbladder: Surgically absent Common bile duct: Diameter: 3 mm Liver: Heterogeneous echogenic hepatic  parenchyma consistent with fatty liver infiltration. Portal vein is patent on color Doppler imaging with normal direction of blood flow towards the liver. Other: Mild ascites. IMPRESSION: Previous cholecystectomy.  No ductal dilatation. Nodular heterogeneous liver with some mild ascites. Please correlate with the prior CT Electronically Signed   By: Karen Kays M.D.   On: 08/12/2022 11:10   [place] EKG SITE RITE  Result Date: 08/12/2022 If Site Rite image not attached, placement could not be confirmed due to current cardiac rhythm.  CT CHEST ABDOMEN PELVIS WO CONTRAST  Result Date: 08/11/2022 CLINICAL DATA:  Sepsis, left leg cellulitis EXAM: CT CHEST, ABDOMEN AND PELVIS WITHOUT CONTRAST CT LEFT FEMUR WITHOUT CONTRAST CT LEFT TIBIA AND FIBULA WITHOUT CONTRAST TECHNIQUE: Multidetector CT imaging of the chest, abdomen and pelvis was performed following the standard protocol without IV contrast. RADIATION DOSE REDUCTION: This exam was performed according to the departmental dose-optimization program which includes automated exposure control, adjustment of the mA and/or kV according to patient size and/or use of iterative  reconstruction technique. COMPARISON:  04/30/2022 FINDINGS: CT CHEST FINDINGS Cardiovascular: Aortic atherosclerosis. Aortic valve calcifications. Normal heart size. Three-vessel coronary artery calcifications. No pericardial effusion. Mediastinum/Nodes: No enlarged mediastinal, hilar, or axillary lymph nodes. Thyroid gland, trachea, and esophagus demonstrate no significant findings. Lungs/Pleura: Trace left pleural effusion. Bandlike scarring or atelectasis of the lung bases. No pleural effusion or pneumothorax. Musculoskeletal: No chest wall abnormality. New, although age indeterminate mild superior endplate wedge deformity of T5 with less than 25% anterior height loss (series 12, image 126). CT ABDOMEN PELVIS FINDINGS Hepatobiliary: Coarse, nodular cirrhotic morphology of the liver. No obvious liver lesion on noncontrast CT. Status post cholecystectomy. No biliary ductal dilatation. Pancreas: Unremarkable. No pancreatic ductal dilatation or surrounding inflammatory changes. Spleen: Mild splenomegaly, maximum span 14.3 cm. Adrenals/Urinary Tract: Adrenal glands are unremarkable. Kidneys are normal, without renal calculi, solid lesion, or hydronephrosis. Bladder is unremarkable. Stomach/Bowel: Stomach is within normal limits. Status post partial right hemicolectomy and ileocolic reanastomosis. No evidence of bowel wall thickening, distention, or inflammatory changes. Vascular/Lymphatic: Aortic atherosclerosis. Large retroperitoneal and mesenteric varices (series 4, image 75, 67). No enlarged abdominal or pelvic lymph nodes. Reproductive: Calcified uterine fibroids. Other: Small, fat and fluid containing low midline ventral hernia (series 4, image 97). Severe anasarca. Small volume ascites throughout the abdomen and pelvis. Musculoskeletal: No acute osseous findings. CT LEFT LOWER EXTREMITY FINDINGS Soft tissues: Severe, diffuse, circumferential anasarca throughout the left lower extremity. To the extent that the  right lower extremity is included for comparison, this appears symmetric. No focal fluid collection. Musculoskeletal: Sarcopenia.  No acute findings. Osseous structures and joints: No fracture or dislocation. Tricompartmental arthrosis of the left knee. No knee joint effusion. Vascular: Severe, diffuse atherosclerosis and vascular calcinosis. Superficial, calcified varices throughout the thigh and calf. IMPRESSION: 1. Severe, diffuse, circumferential anasarca throughout the left lower extremity. To the extent that the right lower extremity is included for comparison, this appears symmetric. No focal fluid collection. 2. Cirrhosis with evidence of portal hypertension including mild splenomegaly, large retroperitoneal and mesenteric varices, and small volume ascites throughout the abdomen and pelvis. 3. New, although age indeterminate mild superior endplate wedge deformity of T5 with less than 25% anterior height loss. Correlate for point tenderness. 4. Coronary artery disease. Aortic Atherosclerosis (ICD10-I70.0). Electronically Signed   By: Jearld Lesch M.D.   On: 08/11/2022 09:28   CT FEMUR LEFT WO CONTRAST  Result Date: 08/11/2022 CLINICAL DATA:  Sepsis, left leg cellulitis EXAM: CT CHEST, ABDOMEN AND PELVIS  WITHOUT CONTRAST CT LEFT FEMUR WITHOUT CONTRAST CT LEFT TIBIA AND FIBULA WITHOUT CONTRAST TECHNIQUE: Multidetector CT imaging of the chest, abdomen and pelvis was performed following the standard protocol without IV contrast. RADIATION DOSE REDUCTION: This exam was performed according to the departmental dose-optimization program which includes automated exposure control, adjustment of the mA and/or kV according to patient size and/or use of iterative reconstruction technique. COMPARISON:  04/30/2022 FINDINGS: CT CHEST FINDINGS Cardiovascular: Aortic atherosclerosis. Aortic valve calcifications. Normal heart size. Three-vessel coronary artery calcifications. No pericardial effusion. Mediastinum/Nodes: No  enlarged mediastinal, hilar, or axillary lymph nodes. Thyroid gland, trachea, and esophagus demonstrate no significant findings. Lungs/Pleura: Trace left pleural effusion. Bandlike scarring or atelectasis of the lung bases. No pleural effusion or pneumothorax. Musculoskeletal: No chest wall abnormality. New, although age indeterminate mild superior endplate wedge deformity of T5 with less than 25% anterior height loss (series 12, image 126). CT ABDOMEN PELVIS FINDINGS Hepatobiliary: Coarse, nodular cirrhotic morphology of the liver. No obvious liver lesion on noncontrast CT. Status post cholecystectomy. No biliary ductal dilatation. Pancreas: Unremarkable. No pancreatic ductal dilatation or surrounding inflammatory changes. Spleen: Mild splenomegaly, maximum span 14.3 cm. Adrenals/Urinary Tract: Adrenal glands are unremarkable. Kidneys are normal, without renal calculi, solid lesion, or hydronephrosis. Bladder is unremarkable. Stomach/Bowel: Stomach is within normal limits. Status post partial right hemicolectomy and ileocolic reanastomosis. No evidence of bowel wall thickening, distention, or inflammatory changes. Vascular/Lymphatic: Aortic atherosclerosis. Large retroperitoneal and mesenteric varices (series 4, image 75, 67). No enlarged abdominal or pelvic lymph nodes. Reproductive: Calcified uterine fibroids. Other: Small, fat and fluid containing low midline ventral hernia (series 4, image 97). Severe anasarca. Small volume ascites throughout the abdomen and pelvis. Musculoskeletal: No acute osseous findings. CT LEFT LOWER EXTREMITY FINDINGS Soft tissues: Severe, diffuse, circumferential anasarca throughout the left lower extremity. To the extent that the right lower extremity is included for comparison, this appears symmetric. No focal fluid collection. Musculoskeletal: Sarcopenia.  No acute findings. Osseous structures and joints: No fracture or dislocation. Tricompartmental arthrosis of the left knee. No  knee joint effusion. Vascular: Severe, diffuse atherosclerosis and vascular calcinosis. Superficial, calcified varices throughout the thigh and calf. IMPRESSION: 1. Severe, diffuse, circumferential anasarca throughout the left lower extremity. To the extent that the right lower extremity is included for comparison, this appears symmetric. No focal fluid collection. 2. Cirrhosis with evidence of portal hypertension including mild splenomegaly, large retroperitoneal and mesenteric varices, and small volume ascites throughout the abdomen and pelvis. 3. New, although age indeterminate mild superior endplate wedge deformity of T5 with less than 25% anterior height loss. Correlate for point tenderness. 4. Coronary artery disease. Aortic Atherosclerosis (ICD10-I70.0). Electronically Signed   By: Jearld Lesch M.D.   On: 08/11/2022 09:28   CT TIBIA FIBULA LEFT WO CONTRAST  Result Date: 08/11/2022 CLINICAL DATA:  Sepsis, left leg cellulitis EXAM: CT CHEST, ABDOMEN AND PELVIS WITHOUT CONTRAST CT LEFT FEMUR WITHOUT CONTRAST CT LEFT TIBIA AND FIBULA WITHOUT CONTRAST TECHNIQUE: Multidetector CT imaging of the chest, abdomen and pelvis was performed following the standard protocol without IV contrast. RADIATION DOSE REDUCTION: This exam was performed according to the departmental dose-optimization program which includes automated exposure control, adjustment of the mA and/or kV according to patient size and/or use of iterative reconstruction technique. COMPARISON:  04/30/2022 FINDINGS: CT CHEST FINDINGS Cardiovascular: Aortic atherosclerosis. Aortic valve calcifications. Normal heart size. Three-vessel coronary artery calcifications. No pericardial effusion. Mediastinum/Nodes: No enlarged mediastinal, hilar, or axillary lymph nodes. Thyroid gland, trachea, and esophagus demonstrate no significant findings. Lungs/Pleura:  Trace left pleural effusion. Bandlike scarring or atelectasis of the lung bases. No pleural effusion or  pneumothorax. Musculoskeletal: No chest wall abnormality. New, although age indeterminate mild superior endplate wedge deformity of T5 with less than 25% anterior height loss (series 12, image 126). CT ABDOMEN PELVIS FINDINGS Hepatobiliary: Coarse, nodular cirrhotic morphology of the liver. No obvious liver lesion on noncontrast CT. Status post cholecystectomy. No biliary ductal dilatation. Pancreas: Unremarkable. No pancreatic ductal dilatation or surrounding inflammatory changes. Spleen: Mild splenomegaly, maximum span 14.3 cm. Adrenals/Urinary Tract: Adrenal glands are unremarkable. Kidneys are normal, without renal calculi, solid lesion, or hydronephrosis. Bladder is unremarkable. Stomach/Bowel: Stomach is within normal limits. Status post partial right hemicolectomy and ileocolic reanastomosis. No evidence of bowel wall thickening, distention, or inflammatory changes. Vascular/Lymphatic: Aortic atherosclerosis. Large retroperitoneal and mesenteric varices (series 4, image 75, 67). No enlarged abdominal or pelvic lymph nodes. Reproductive: Calcified uterine fibroids. Other: Small, fat and fluid containing low midline ventral hernia (series 4, image 97). Severe anasarca. Small volume ascites throughout the abdomen and pelvis. Musculoskeletal: No acute osseous findings. CT LEFT LOWER EXTREMITY FINDINGS Soft tissues: Severe, diffuse, circumferential anasarca throughout the left lower extremity. To the extent that the right lower extremity is included for comparison, this appears symmetric. No focal fluid collection. Musculoskeletal: Sarcopenia.  No acute findings. Osseous structures and joints: No fracture or dislocation. Tricompartmental arthrosis of the left knee. No knee joint effusion. Vascular: Severe, diffuse atherosclerosis and vascular calcinosis. Superficial, calcified varices throughout the thigh and calf. IMPRESSION: 1. Severe, diffuse, circumferential anasarca throughout the left lower extremity. To the  extent that the right lower extremity is included for comparison, this appears symmetric. No focal fluid collection. 2. Cirrhosis with evidence of portal hypertension including mild splenomegaly, large retroperitoneal and mesenteric varices, and small volume ascites throughout the abdomen and pelvis. 3. New, although age indeterminate mild superior endplate wedge deformity of T5 with less than 25% anterior height loss. Correlate for point tenderness. 4. Coronary artery disease. Aortic Atherosclerosis (ICD10-I70.0). Electronically Signed   By: Jearld Lesch M.D.   On: 08/11/2022 09:28   DG Chest Port 1 View  Result Date: 08/11/2022 CLINICAL DATA:  Questionable sepsis. Fatigue and shortness of breath. EXAM: PORTABLE CHEST 1 VIEW COMPARISON:  07/29/2022 FINDINGS: Stable cardiac enlargement. Low lung volumes. Scarring is noted within the left midlung. No pleural fluid, interstitial edema or airspace disease. The visualized skeletal structures are unremarkable. IMPRESSION: Low lung volumes. No active disease. Electronically Signed   By: Signa Kell M.D.   On: 08/11/2022 07:12   DG Chest 2 View  Result Date: 07/29/2022 CLINICAL DATA:  Cough. Upper back pain for 1 week. Vomiting Saturday. EXAM: CHEST - 2 VIEW COMPARISON:  06/12/2022 FINDINGS: Shallow inspiration. Cardiac enlargement. Linear scarring in the left mid lung. No vascular congestion, edema, or consolidation. No pleural effusions. No pneumothorax. Mediastinal contours appear intact. IMPRESSION: Cardiac enlargement.  No evidence of active pulmonary disease. Electronically Signed   By: Burman Nieves M.D.   On: 07/29/2022 15:18     I have personally spent 95 minutes involved in face-to-face and non-face-to-face activities for this patient on the day of the visit. Professional time spent includes the following activities: Preparing to see the patient (review of tests), Obtaining and/or reviewing separately obtained history (admission/discharge  record), Performing a medically appropriate examination and/or evaluation , Ordering medications/tests/procedures, referring and communicating with other health care professionals, Documenting clinical information in the EMR, Independently interpreting results (not separately reported), Communicating results to the patient/family/caregiver,  Counseling and educating the patient/family/caregiver and Care coordination (not separately reported).  Electronically signed by:   Plan d/w requesting provider as well as ID pharm D  Note: This document was prepared using dragon voice recognition software and may include unintentional dictation errors.   Odette Fraction, MD Infectious Disease Physician [facility] Pager: [phone]

## 2022-08-14 NOTE — Progress Notes (Signed)
PCCM Update:  Large dark bowel movement that is heme positive reported by nurse.  PPI IV BID started.  Continue to trend CBC and transfuse as needed  Melody Comas, MD [facility] Office: [phone]   See Amion for personal pager PCCM on call pager [phone] until 7pm. Please call Elink 7p-7a. [phone]

## 2022-08-14 NOTE — Progress Notes (Signed)
NAME:  [name], MRN:  [id number], DOB:  12-04-1955, LOS: 3 ADMISSION DATE:  08/11/2022, CONSULTATION DATE:  08/11/22 REFERRING MD:  Adan Sis, PA, CHIEF COMPLAINT:  fatigue/ confusion   History of Present Illness:   67 year old female with PMH as below significant for NASH cirrhosis, HE, morbid obesity, chronic diarrhea, and MDR UTI (e. Coli and Klebsiella) who presents from home with complaints of left leg pain, fatigue, mild confusion, and recent N/V.   Patient lives with her son and cared for by her son, currently at bedside.  He provides most history.  Reports she has been doing better, starting to ambulate some with walker.  Voices some confusion over her diuretic regimen between what [facility], PCP, and prior hospital discharge instructions were.  Has only been taking lasix 20mg  and spironolactone daily.  Has not needed midodrine.  Reports several bouts of N/V on Tuesday, son suspects related to bad food, with ongoing poor PO intake since.  Developed left leg pain yesterday in which she went to urgent care for diagnosed with cellulitis and placed on keflex. Son reports they have a stray kitten at home who frequently scratches them.  Also, patient has frequent itching/ scratching of skin and sinus drainage with clear sputum for about one month.  Reports patient was able to ambulate to the car this morning with her walker.  No further N/V , changes in stool from her chronic diarrhea, no fever, chills, no concern for bleeding.  Denies urinary symptoms.   In ER, afebrile, SBP 70-90's, and room air sats 90 but since improved to normal on room air. Labs significant for normal WBC, H/H 7.5/ 24.6, plts 96, Na 130, sCr 2.41, BUN 37, bicarb 13, albumin 1.9, protein 5, normal LFTs except t. Bili near baseline 2.2, INR 3.4, lactic 6.1> 4.2, ammonia 24, PCT 16.  UA noted for trace leukocytes with 21-50 WBC, 21-50 squamous cells, many bacteria. CXR without acute process.  Cultures sent, treated with vancomycin,  meropenem, and clindamycin.  Given NS bolus with minimal improvement in SBP, therefore placed on low dose peripheral levophed.  Underwent imaging CT chest/ abd/ pelvis, CT left femur and tibia/ fibula found to have symmetric severe/ diffuse circumferential anasarca without focal fluid collection and noted new indeterminate wedge deformity of T5.  Son reports mental status is improved with improved blood pressure.  PCCM called for admit.    Pertinent  Medical History  NASH cirrhosis, HE, morbid obesity, PE not on AC (2014), IDA, GERD, HTN but on prn midodrine, short gut syndrome s/p right colectomy, end ileostomy, subsequent takedown of end ileostomy, small bowel resection in 2009, s/p cholecystectomy, chronic diarrhea, one episode of PAF during one hospitalization MDR UTI, former smoker  Significant Hospital Events: Including procedures, antibiotic start and stop dates in addition to other pertinent events   8/1 Admit with hypotension, possible sepsis/ cellulitis of left leg, AKI, anasarca 8/3 increasing delirium, blood cultures positive for flavobacterium (same as blood cultures in 04/2022) - ID started cefiderocol IV  Interim History / Subjective:   Increasing agitation/AMS overnight - precedex and Geodon given  Somnolent this morning, precedex turned off. She says her legs are feeling better.  Hgb confirmed low 5.8-6g/dL   Objective   Blood pressure (!) 118/58, pulse 62, temperature (!) 96.4 F (35.8 C), temperature source Axillary, resp. rate 13, height 5\' 2"  (1.575 m), weight 124.7 kg, SpO2 99%. CVP:  [17 mmHg-35 mmHg] 26 mmHg      Intake/Output  Summary (Last 24 hours) at 08/14/2022 0803 Last data filed at 08/14/2022 8413 Gross per 24 hour  Intake 1612.31 ml  Output 600 ml  Net 1012.31 ml   Filed Weights   08/11/22 0636 08/11/22 1320 08/12/22 0500  Weight: 106.6 kg 121.8 kg 124.7 kg   Examination: General:  chronically ill appearing morbidly obese female lying in bed,  sleeping in NAD HEENT: MM pink/dry, R pupil 4/r, L opaque, anicteric Neuro: somnolent, but wakes to verbal stimuli CV: rr, +murmur PULM:  non labored, clear anteriorly, diminished in bases GI: soft, bs+, some suprapubic tenderness, purwick Extremities: warm, + edema Skin: erythema of left lower extremity - improved  Cr 2.7 Hgb 6 Plt 54 INR 2.5  Resolved Hospital Problem list    Assessment & Plan:   Shock- multifactorial> Flavobacterium bacteremia, LLE cellulitis, polymicrobial UTI, and hypovolemic, +/- AI component - Also with hx of chronic hypotension, with prn midodrine at home - CT imaging not c/w with focal collection on left leg.  CXR/ CT chest neg for acute process  P: - cont to wean NE for MAP goal >65 - cont cefiderocol and azithro (for 5 days, to cover cat scratches/ bartonella.  - Appreciate ID input - follow repeat Bcs - continue midodrine 15mg  TID - cont stress dose steroids BID.  Inconclusive cortisol stem test in May, with recs then to f/u w/ OP endocrinology  - trend WBC/ fever curve  - left leg cellulitis improving clinically, cont to monitor closely  - minimal ascites on imaging, without abd pain/ tenderness, defer diagnostic paracentesis for now  AKI Hyponatremia NAGMA Lactic acidosis> improving Hypomagnesia  Hypoalbuminemia   - no obstruction or hydronephrosis on CT imaging - baseline ~ sCr 0.8 (May 2024) P:  - Cr elevated again today, oliguria - replete electrolytes as needed - trend renal indices  - strict I/Os, daily wts - avoid nephrotoxins, renal dose meds, hemodynamic support as above - continue oral bicarb - Check urine electrolytes to calculate FENA  NASH cirrhosis, advanced Coagulopathy, chronic  Anasarca  Chronic diarrhea  - MELD score 32 with 52.6% 3 month mortality.  Does not appear decompensated at this time, more related to sepsis and hypovolemia.  - ammonia at baseline.  No encephalopathy present  P:  - RUQ US unremarkable -  hold lactulose due to increased stool output yesterday  - cont rifaximin - INR improved slightly - follows with [facility]> plans/ needs EGD for variceal screening and recently referred to hepatology.     Chronic normocytic anemia and thrombocytopenia  - baseline H/H runs 7-9, admit Hgb 7.5.  - T&S sent 8/1 P:  - cont SCDs for now - repeat CBC confirm hgb <6, will transfuse 2 units PRBCs and 1 unit platelets   Hx HTN HFpEF Aortic stenosis - prior TTE 06/2022> EF 60-65%, G1DD, mod AS P:  - hold lasix and albumin today   GERD - cont PPI    Hypothyroidism - cont synthroid   Depression - cont venlafaxine   Morbid obesity - encourage outpt wt loss evaluation   Thoracic spine, T5 wedge deformity - pt reports some chronic back pain, denies injury.  States follows with othro. F/u OP   GOC - son reports she has a DNR at home.  Pt verified DNR/ DNI status, ok with aggressive medical management otherwise.   Best Practice (right click and "Reselect all SmartList Selections" daily)   Diet/type: Regular consistency (see orders)> low salt DVT prophylaxis: SCD GI prophylaxis: PPI Lines:  N/A Foley:  N/A Code Status:  DNR/ DNI Last date of multidisciplinary goals of care discussion [8/1]  Patient son updated via phone today 8/4   Labs   CBC: Recent Labs  Lab 08/11/22 0730 08/12/22 0843 08/12/22 1512 08/13/22 0335 08/13/22 1310 08/13/22 1900 08/14/22 0630  WBC 6.2 17.2*  --  21.3*  --  19.7* 6.5  NEUTROABS 5.4  --   --   --   --  16.6*  --   HGB 7.5* 7.5*  --  7.1* 7.4* 7.1* 6.0*  HCT 24.6* 25.2*  --  22.7* 23.2* 22.3* 18.6*  MCV 99.6 102.0*  --  97.8  --  95.3 93.0  PLT 96* 111* 129* 112*  --  95* 54*    Basic Metabolic Panel: Recent Labs  Lab 08/11/22 0730 08/12/22 0325 08/13/22 0335 08/13/22 1900 08/14/22 0630  NA 130* 129* 128* 128* 130*  K 3.7 4.6 4.5 3.9 4.1  CL 103 103 103 102 103  CO2 13* 17* 16* 16* 16*  GLUCOSE 108* 126* 144* 155* 141*   BUN 37* 41* 49* 54* 63*  CREATININE 2.41* 2.10* 2.50* 2.65* 2.70*  CALCIUM 7.8* 8.3* 7.8* 7.7* 7.7*  MG 1.4* 1.9 2.0  --   --    GFR: Estimated Creatinine Clearance: 26.6 mL/min (A) (by C-G formula based on SCr of 2.7 mg/dL (H)). Recent Labs  Lab 08/11/22 0730 08/11/22 0739 08/11/22 0939 08/12/22 0843 08/13/22 0335 08/13/22 0930 08/13/22 1900 08/14/22 0630  PROCALCITON 16.20  --   --   --   --   --   --   --   WBC 6.2  --   --  17.2* 21.3*  --  19.7* 6.5  LATICACIDVEN  --  6.1* 4.2*  --   --  1.2  --   --     Liver Function Tests: Recent Labs  Lab 08/11/22 0730 08/12/22 0325 08/13/22 0930 08/14/22 0630  AST 32 22 13* 13*  ALT 28 25 21 18   ALKPHOS 98 86 81 62  BILITOT 2.2* 3.0* 2.4* 2.5*  PROT 5.0* 5.6* 5.6* 6.1*  ALBUMIN 1.9* 2.8* 3.1* 3.8   No results for input(s): "LIPASE", "AMYLASE" in the last 168 hours. Recent Labs  Lab 08/11/22 0730 08/13/22 0930  AMMONIA 24 27    ABG    Component Value Date/Time   HCO3 15.8 (L) 08/13/2022 2240   TCO2 14 (L) 04/30/2022 2034   ACIDBASEDEF 8.8 (H) 08/13/2022 2240   O2SAT 100 08/13/2022 2240     Coagulation Profile: Recent Labs  Lab 08/11/22 0730 08/12/22 0325 08/12/22 1512 08/13/22 0335 08/14/22 0630  INR 3.4* 2.3* 3.4* 3.2* 2.5*    Cardiac Enzymes: No results for input(s): "CKTOTAL", "CKMB", "CKMBINDEX", "TROPONINI" in the last 168 hours.  HbA1C: No results found for: "HGBA1C"  CBG: Recent Labs  Lab 08/13/22 1153 08/13/22 1607 08/13/22 2002 08/13/22 2335 08/14/22 0500  GLUCAP 128* 129* 121* 125* 123*    Critical care time: 35 mins     Melody Comas, MD [facility] Office: [phone]   See Amion for personal pager PCCM on call pager [phone] until 7pm. Please call Elink 7p-7a. [phone]

## 2022-08-14 NOTE — Plan of Care (Signed)
Pt continues to be a critically ill patient. Mentation has gotten worse over the last 24 hours. Goals for transfer to lower level of care not currently being met

## 2022-08-15 ENCOUNTER — Inpatient Hospital Stay (HOSPITAL_COMMUNITY): Payer: Medicare (Managed Care)

## 2022-08-15 DIAGNOSIS — R579 Shock, unspecified: Secondary | ICD-10-CM | POA: Diagnosis not present

## 2022-08-15 DIAGNOSIS — A419 Sepsis, unspecified organism: Secondary | ICD-10-CM | POA: Diagnosis not present

## 2022-08-15 DIAGNOSIS — E861 Hypovolemia: Secondary | ICD-10-CM | POA: Diagnosis not present

## 2022-08-15 DIAGNOSIS — B9689 Other specified bacterial agents as the cause of diseases classified elsewhere: Secondary | ICD-10-CM | POA: Diagnosis not present

## 2022-08-15 DIAGNOSIS — L03116 Cellulitis of left lower limb: Secondary | ICD-10-CM | POA: Diagnosis not present

## 2022-08-15 LAB — CBC
HCT: 21.9 % — ABNORMAL LOW (ref 36.0–46.0)
Hemoglobin: 7.3 g/dL — ABNORMAL LOW (ref 12.0–15.0)
MCH: 30.3 pg (ref 26.0–34.0)
MCHC: 33.3 g/dL (ref 30.0–36.0)
MCV: 90.9 fL (ref 80.0–100.0)
Platelets: 58 10*3/uL — ABNORMAL LOW (ref 150–400)
RBC: 2.41 MIL/uL — ABNORMAL LOW (ref 3.87–5.11)
RDW: 15.5 % (ref 11.5–15.5)
WBC: 3.9 10*3/uL — ABNORMAL LOW (ref 4.0–10.5)
nRBC: 0 % (ref 0.0–0.2)

## 2022-08-15 LAB — GLUCOSE, CAPILLARY
Glucose-Capillary: 112 mg/dL — ABNORMAL HIGH (ref 70–99)
Glucose-Capillary: 119 mg/dL — ABNORMAL HIGH (ref 70–99)
Glucose-Capillary: 123 mg/dL — ABNORMAL HIGH (ref 70–99)
Glucose-Capillary: 113 mg/dL — ABNORMAL HIGH (ref 70–99)
Glucose-Capillary: 127 mg/dL — ABNORMAL HIGH (ref 70–99)

## 2022-08-15 LAB — COMPREHENSIVE METABOLIC PANEL
ALT: 18 U/L (ref 0–44)
AST: 14 U/L — ABNORMAL LOW (ref 15–41)
Albumin: 3 g/dL — ABNORMAL LOW (ref 3.5–5.0)
Alkaline Phosphatase: 64 U/L (ref 38–126)
Anion gap: 11 (ref 5–15)
BUN: 76 mg/dL — ABNORMAL HIGH (ref 8–23)
CO2: 16 mmol/L — ABNORMAL LOW (ref 22–32)
Calcium: 7.5 mg/dL — ABNORMAL LOW (ref 8.9–10.3)
Chloride: 103 mmol/L (ref 98–111)
Creatinine, Ser: 2.85 mg/dL — ABNORMAL HIGH (ref 0.44–1.00)
GFR, Estimated: 18 mL/min — ABNORMAL LOW (ref >60)
Glucose, Bld: 123 mg/dL — ABNORMAL HIGH (ref 70–99)
Potassium: 4.2 mmol/L (ref 3.5–5.1)
Sodium: 130 mmol/L — ABNORMAL LOW (ref 135–145)
Total Bilirubin: 2.4 mg/dL — ABNORMAL HIGH (ref 0.3–1.2)
Total Protein: 5.1 g/dL — ABNORMAL LOW (ref 6.5–8.1)

## 2022-08-15 LAB — MAGNESIUM
Magnesium: 2 mg/dL (ref 1.7–2.4)
Magnesium: 1.8 mg/dL (ref 1.7–2.4)

## 2022-08-15 LAB — PHOSPHORUS
Phosphorus: 6.9 mg/dL — ABNORMAL HIGH (ref 2.5–4.6)
Phosphorus: 6 mg/dL — ABNORMAL HIGH (ref 2.5–4.6)

## 2022-08-15 MED ORDER — MAGNESIUM SULFATE IN D5W 1-5 GM/100ML-% IV SOLN
1.0000 g | Freq: Once | INTRAVENOUS | Status: AC
  Administered 2022-08-15: 1 g via INTRAVENOUS
  Filled 2022-08-15: qty 100

## 2022-08-15 MED ORDER — VITAL 1.5 CAL PO LIQD
1000.0000 mL | ORAL | Status: DC
  Administered 2022-08-15 – 2022-08-16 (×2): 1000 mL
  Filled 2022-08-15 (×3): qty 1000
  Filled 2022-08-15: qty 1185
  Filled 2022-08-15: qty 1000

## 2022-08-15 MED ORDER — VITAL HIGH PROTEIN PO LIQD: 1000.0000 mL | ORAL | Status: DC

## 2022-08-15 MED ORDER — STERILE WATER FOR INJECTION IV SOLN
INTRAVENOUS | Status: AC
  Filled 2022-08-15: qty 1000
  Filled 2022-08-15 (×2): qty 150
  Filled 2022-08-15: qty 1000

## 2022-08-15 MED ORDER — MIDODRINE HCL 5 MG PO TABS
15.0000 mg | ORAL_TABLET | Freq: Three times a day (TID) | ORAL | Status: DC
  Administered 2022-08-15 – 2022-08-17 (×8): 15 mg
  Filled 2022-08-15 (×7): qty 3

## 2022-08-15 MED ORDER — PROSOURCE TF20 ENFIT COMPATIBL EN LIQD
60.0000 mL | Freq: Every day | ENTERAL | Status: DC
  Administered 2022-08-15 – 2022-08-17 (×3): 60 mL
  Filled 2022-08-15 (×3): qty 60

## 2022-08-15 NOTE — TOC Initial Note (Signed)
Transition of Care [facility]) - Initial/Assessment Note    Patient Details  Name: [name] MRN: [id number] Date of Birth: 04/01/1955  Transition of Care [facility]) CM/SW Contact:    Otelia Santee, LCSW Phone Number: 08/15/2022, 3:22 PM  Clinical Narrative:                 CSW received message from [facility] that this pt is active with their services for HHPT.   TOC will continue to follow pt.   Expected Discharge Plan: Home w Home Health Services Barriers to Discharge: Continued Medical Work up   Patient Goals and CMS Choice Patient states their goals for this hospitalization and ongoing recovery are:: UTA CMS Medicare.gov Compare Post Acute Care list provided to:: Patient Represenative (must comment) Choice offered to / list presented to : Adult Children [place] ownership interest in [facility].provided to:: Adult Children    Expected Discharge Plan and Services In-house Referral: NA Discharge Planning Services: NA Post Acute Care Choice: Resumption of Svcs/PTA Provider, Home Health Living arrangements for the past 2 months: Single Family Home                                      Prior Living Arrangements/Services Living arrangements for the past 2 months: Single Family Home Lives with:: Adult Children Patient language and need for interpreter reviewed:: Yes Do you feel safe going back to the place where you live?: Yes      Need for Family Participation in Patient Care: Yes (Comment) Care giver support system in place?: Yes (comment) Current home services: Home PT, Home RN ([facility]) Criminal Activity/Legal Involvement Pertinent to Current Situation/Hospitalization: No - Comment as needed  Activities of Daily Living Home Assistive Devices/Equipment: Environmental consultant (specify type) ADL Screening (condition at time of admission) Patient's cognitive ability adequate to safely complete daily activities?: Yes Is the patient deaf or have difficulty hearing?:  No Does the patient have difficulty seeing, even when wearing glasses/contacts?: Yes Does the patient have difficulty concentrating, remembering, or making decisions?: No Patient able to express need for assistance with ADLs?: Yes Does the patient have difficulty dressing or bathing?: Yes Independently performs ADLs?: No Communication: Independent Dressing (OT): Needs assistance Is this a change from baseline?: Pre-admission baseline Grooming: Needs assistance Is this a change from baseline?: Pre-admission baseline Feeding: Independent Bathing: Needs assistance Is this a change from baseline?: Pre-admission baseline Toileting: Needs assistance Is this a change from baseline?: Pre-admission baseline In/Out Bed: Needs assistance Is this a change from baseline?: Pre-admission baseline Walks in Home: Needs assistance, Independent with device (comment) Is this a change from baseline?: Pre-admission baseline Does the patient have difficulty walking or climbing stairs?: Yes Weakness of Legs: Both Weakness of Arms/Hands: None  Permission Sought/Granted   Permission granted to share information with : No              Emotional Assessment   Attitude/Demeanor/Rapport: Unable to Assess Affect (typically observed): Unable to Assess     Psych Involvement: No (comment)  Admission diagnosis:  Hypotension [I95.9] Patient Active Problem List   Diagnosis Date Noted   Hypotension 08/11/2022   Folate deficiency 06/14/2022   Acute metabolic encephalopathy 06/13/2022   Systolic murmur 06/12/2022   Iron deficiency anemia 05/16/2022   History of eye surgery 05/01/2022   Obesity, Class III, BMI 40-49.9 (morbid obesity) (HCC) 05/01/2022   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  GERD (gastroesophageal reflux disease) 07/25/2016   Hypothyroidism 07/25/2016   PCP:  [name], MD Pharmacy:   [phone] - [place], [place] - [address] [place] [address] Phone:  [phone] Fax: [phone]  [id number], [place] - [address] AT [facility] [address] [place] [address] Phone: [phone] Fax: [phone]     Social Determinants of Health (SDOH) Social History: SDOH Screenings   Food Insecurity: No Food Insecurity (08/11/2022)  Housing: Low Risk  (08/11/2022)  Transportation Needs: No Transportation Needs (08/11/2022)  Utilities: Not At Risk (08/11/2022)  Tobacco Use: Medium Risk (08/11/2022)   SDOH Interventions:     Readmission Risk Interventions    08/15/2022    3:20 PM 05/18/2022   10:24 AM 05/06/2022    3:39 PM  Readmission Risk Prevention Plan  Transportation Screening Complete Complete Complete  PCP or Specialist Appt within 5-7 Days  Complete Complete  Home Care Screening  Complete Complete  Medication Review (RN CM)  Complete Complete  Medication Review (RN Care Manager) Complete    PCP or Specialist appointment within 3-5 days of discharge Complete    HRI or Home Care Consult Complete    SW Recovery Care/Counseling Consult Complete    Palliative Care Screening Not Applicable    Skilled Nursing Facility Not Applicable

## 2022-08-15 NOTE — Progress Notes (Signed)
eLink Physician-Brief Progress Note Patient Name: [name] DOB: 1955/11/23 MRN: [id number]   Date of Service  08/15/2022  HPI/Events of Note  Pt's son, who is also the legal guardian would like to update code status. He is ok with Intubation, but no CPR.  Camera: HR 49, on precedex 9.5 97%. Obese. 114/56.   41 F Advanced cirrhosis, leg cellulitis- shock-flavobacterium. ID on board. AKI/NAGMA. On bicarb at 75 ml/hr.   Anasarca   eICU Interventions  Discussed with Son. Confirmed Code status. DNR but ok to Intubate. Been intubated in the past.  ABG once.      Intervention Category Intermediate Interventions: Communication with other healthcare providers and/or family  Ranee Gosselin 08/15/2022, 10:00 PM

## 2022-08-15 NOTE — Progress Notes (Signed)
Initial Nutrition Assessment  DOCUMENTATION CODES:   Morbid obesity  INTERVENTION:  - Per CCM, start tube feeds today.  Initiate tube feeding via NGT: Vital 1.5 at 50 ml/h (1200 ml per day) *Start at 4mL/hr and advance by 10mL Q8H Provides 1800 kcal, 81 gm protein, 917 ml free water daily  - Monitor magnesium, potassium, and phosphorus BID for at least 3 days, MD to replete as needed.  - FWF per CCM/MD.   - Monitor weight trends.  NUTRITION DIAGNOSIS:   Inadequate oral intake related to acute illness, lethargy/confusion as evidenced by energy intake < or equal to 50% for > or equal to 5 days.  GOAL:   Patient will meet greater than or equal to 90% of their needs  MONITOR:   PO intake, Labs, TF tolerance  REASON FOR ASSESSMENT:   Consult Enteral/tube feeding initiation and management  ASSESSMENT:   67 year old female with PMH NASH cirrhosis, HE, morbid obesity, and MDR UTI (e. 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 Found to have multifactorial shock.   8/1: Admit 8/5: NGT placed  Patient sleeping at time of visit, son at bedside who provided all history.   UBW reported to be 235-250#. Son reports her weight tends to fluctuate but no significant changes recently.   Endorses patient was eating well PTA. Usually eats 2-3 meals a day plus protein rich snack in between. Appetite normal and patient eating well up until day of admission.   Notes that since admission she has had little to eat. Had 30% of dinner on 8/2 but no meal intakes documented since then. Patient noted to have had worsening confusion since 8/3 and was requiring restraints last night.  Plan per CCM to place NGT and start tube feeds. NGT placed this AM, now xray verified in the stomach.  Son aware of plan. He is hopeful she will wake up  and be more alert and eat better soon so the tube can be removed.    Medications reviewed and include: -  Labs reviewed:  Na 130 Creatinine 2.85   NUTRITION - FOCUSED PHYSICAL EXAM:  Flowsheet Row Most Recent Value  Orbital Region No depletion  Upper Arm Region No depletion  Thoracic and Lumbar Region No depletion  Buccal Region No depletion  Temple Region Mild depletion  Clavicle Bone Region No depletion  Clavicle and Acromion Bone Region No depletion  Scapular Bone Region Unable to assess  Dorsal Hand No depletion  Patellar Region No depletion  Anterior Thigh Region No depletion  Posterior Calf Region No depletion  Edema (RD Assessment) Mild  Hair Reviewed  Eyes Reviewed  Mouth Reviewed  Skin Reviewed  Nails Reviewed       Diet Order:   Diet Order             Diet 2 gram sodium Room service appropriate? Yes; Fluid consistency: Thin  Diet effective now                   EDUCATION NEEDS:  Education needs have been addressed  Skin:  Skin Assessment: Skin Integrity Issues: Skin Integrity Issues:: Other (Comment) Other: Skin tear right buttocks  Last BM:  8/4  Height:  Ht Readings from Last 1 Encounters:  08/11/22 5\' 2"  (1.575 m)   Weight:  Wt Readings from Last 1 Encounters:  08/14/22 126  kg   Ideal Body Weight:  50 kg  BMI:  Body mass index is 50.81 kg/m.  Estimated Nutritional Needs:  Kcal:  1750-2000 kcals Protein:  75-100 grams Fluid:  >/= 1.8L    Shelle Iron RD, LDN For contact information, refer to [facility].

## 2022-08-15 NOTE — Progress Notes (Signed)
NAME:  [name], MRN:  [id number], DOB:  Apr 08, 1955, LOS: 4 ADMISSION DATE:  08/11/2022, CONSULTATION DATE:  08/11/22 REFERRING MD:  Adan Sis, PA, CHIEF COMPLAINT:  fatigue/ confusion   History of Present Illness:   67 year old female with PMH as below significant for NASH cirrhosis, HE, morbid obesity, chronic diarrhea, and MDR UTI (e. Coli and Klebsiella) who presents from home with complaints of left leg pain, fatigue, mild confusion, and recent N/V.   Patient lives with her son and cared for by her son, currently at bedside.  He provides most history.  Reports she has been doing better, starting to ambulate some with walker.  Voices some confusion over her diuretic regimen between what [facility], PCP, and prior hospital discharge instructions were.  Has only been taking lasix 20mg  and spironolactone daily.  Has not needed midodrine.  Reports several bouts of N/V on Tuesday, son suspects related to bad food, with ongoing poor PO intake since.  Developed left leg pain yesterday in which she went to urgent care for diagnosed with cellulitis and placed on keflex. Son reports they have a stray kitten at home who frequently scratches them.  Also, patient has frequent itching/ scratching of skin and sinus drainage with clear sputum for about one month.  Reports patient was able to ambulate to the car this morning with her walker.  No further N/V , changes in stool from her chronic diarrhea, no fever, chills, no concern for bleeding.  Denies urinary symptoms.   In ER, afebrile, SBP 70-90's, and room air sats 90 but since improved to normal on room air. Labs significant for normal WBC, H/H 7.5/ 24.6, plts 96, Na 130, sCr 2.41, BUN 37, bicarb 13, albumin 1.9, protein 5, normal LFTs except t. Bili near baseline 2.2, INR 3.4, lactic 6.1> 4.2, ammonia 24, PCT 16.  UA noted for trace leukocytes with 21-50 WBC, 21-50 squamous cells, many bacteria. CXR without acute process.  Cultures sent, treated with vancomycin,  meropenem, and clindamycin.  Given NS bolus with minimal improvement in SBP, therefore placed on low dose peripheral levophed.  Underwent imaging CT chest/ abd/ pelvis, CT left femur and tibia/ fibula found to have symmetric severe/ diffuse circumferential anasarca without focal fluid collection and noted new indeterminate wedge deformity of T5.  Son reports mental status is improved with improved blood pressure.  PCCM called for admit.    Pertinent  Medical History  NASH cirrhosis, HE, morbid obesity, PE not on AC (2014), IDA, GERD, HTN but on prn midodrine, short gut syndrome s/p right colectomy, end ileostomy, subsequent takedown of end ileostomy, small bowel resection in 2009, s/p cholecystectomy, chronic diarrhea, one episode of PAF during one hospitalization MDR UTI, former smoker  Significant Hospital Events: Including procedures, antibiotic start and stop dates in addition to other pertinent events   8/1 Admit with hypotension, possible sepsis/ cellulitis of left leg, AKI, anasarca 8/3 increasing delirium, blood cultures positive for flavobacterium (same as blood cultures in 04/2022) - ID started cefiderocol IV  Interim History / Subjective:   Agitation/AMS again overnight - precedex and Geodon given  Spoke with son at bedside, requested less sedating meds given if possible   Objective   Blood pressure 126/67, pulse 61, temperature (!) 96.9 F (36.1 C), temperature source Axillary, resp. rate 12, height 5\' 2"  (1.575 m), weight 126 kg, SpO2 97%. CVP:  [17 mmHg-19 mmHg] 18 mmHg      Intake/Output Summary (Last 24 hours) at 08/15/2022 [phone]  Last data filed at 08/15/2022 8119 Gross per 24 hour  Intake 1922.74 ml  Output 850 ml  Net 1072.74 ml   Filed Weights   08/11/22 1320 08/12/22 0500 08/14/22 0704  Weight: 121.8 kg 124.7 kg 126 kg   Examination: General:  chronically ill appearing morbidly obese female lying in bed, sleeping in NAD HEENT: MM pink/dry, R pupil 4/r, L  opaque, anicteric Neuro: somnolent, but wakes to verbal stimuli CV: rr, +murmur PULM:  non labored, clear anteriorly, diminished in bases GI: soft, bs+, some suprapubic tenderness, purwick Extremities: warm, + edema Skin: erythema of left lower extremity - improved  Cr 2.8 Hgb 7.3 Plt 58 INR 2.0 FENA 0.26%  Resolved Hospital Problem list    Assessment & Plan:   Shock- multifactorial> Flavobacterium bacteremia, LLE cellulitis, polymicrobial UTI, and hypovolemic, +/- AI component - Also with hx of chronic hypotension, with prn midodrine at home - CT imaging not c/w with focal collection on left leg.  CXR/ CT chest neg for acute process  P: - weaned off levophed yesterday - cont meropenem and azithro (for 5 days, to cover cat scratches/ bartonella.  - Appreciate ID input - follow repeat Bcs - continue midodrine 15mg  TID - stress dose steroids discontinued today - trend WBC/ fever curve  - left leg cellulitis improving clinically, cont to monitor closely  - minimal ascites on imaging, without abd pain/ tenderness, defer diagnostic paracentesis for now  AKI Hyponatremia NAGMA Hypomagnesia  Hypoalbuminemia   - no obstruction or hydronephrosis on CT imaging - baseline ~ sCr 0.8 (May 2024) P:  - Cr elevated again today, oliguria - replete electrolytes as needed - trend renal indices  - strict I/Os, daily wts - avoid nephrotoxins, renal dose meds, hemodynamic support as above - continue oral bicarb - Fena <1, concerning for pre-renal etiology - start bicarb drip at 85ml/hr  NASH cirrhosis, advanced Coagulopathy, chronic  Anasarca  Chronic diarrhea  GI Bleed - MELD score 32 with 52.6% 3 month mortality.  Does not appear decompensated at this time, more related to sepsis and hypovolemia.  - ammonia at baseline.   P:  - RUQ US unremarkable - hold lactulose due to increased stool output yesterday  - cont rifaximin, place NG tube today - INR improved slightly - follows  with [facility]> plans/ needs EGD for variceal screening and recently referred to hepatology.   - IV pantoprazole 40mg  BID   Chronic normocytic anemia and thrombocytopenia  - baseline H/H runs 7-9, admit Hgb 7.5.  - T&S sent 8/1 P:  - cont SCDs for now - repeat CBC confirm hgb <7   Hx HTN HFpEF Aortic stenosis - prior TTE 06/2022> EF 60-65%, G1DD, mod AS P:  - monitor   GERD - cont PPI    Hypothyroidism - cont synthroid   Depression - cont venlafaxine   Morbid obesity - encourage outpt wt loss evaluation   Thoracic spine, T5 wedge deformity - pt reports some chronic back pain, denies injury.  States follows with othro. F/u OP   GOC - son reports she has a DNR at home.  Pt verified DNR/ DNI status, ok with aggressive medical management otherwise.   Best Practice (right click and "Reselect all SmartList Selections" daily)   Diet/type: tubefeeds DVT prophylaxis: SCD GI prophylaxis: PPI Lines: N/A Foley:  N/A Code Status:  DNR/ DNI Last date of multidisciplinary goals of care discussion [8/1]  Patient son updated at bedside 8/5   Labs   CBC: Recent  Labs  Lab 08/11/22 0730 08/12/22 0843 08/13/22 1900 08/14/22 0630 08/14/22 0751 08/14/22 1316 08/14/22 1924 08/15/22 0552  WBC 6.2   < > 19.7* 6.5 6.2  --  9.7 3.9*  NEUTROABS 5.4  --  16.6*  --  5.0  --   --   --   HGB 7.5*   < > 7.1* 6.0* 5.8*  --  7.8* 7.3*  HCT 24.6*   < > 22.3* 18.6* 18.2*  --  23.8* 21.9*  MCV 99.6   < > 95.3 93.0 94.3  --  92.6 90.9  PLT 96*   < > 95* 54* 53* 74* 80* 58*   < > = values in this interval not displayed.    Basic Metabolic Panel: Recent Labs  Lab 08/11/22 0730 08/12/22 0325 08/13/22 0335 08/13/22 1900 08/14/22 0630 08/15/22 0552  NA 130* 129* 128* 128* 130* 130*  K 3.7 4.6 4.5 3.9 4.1 4.2  CL 103 103 103 102 103 103  CO2 13* 17* 16* 16* 16* 16*  GLUCOSE 108* 126* 144* 155* 141* 123*  BUN 37* 41* 49* 54* 63* 76*  CREATININE 2.41* 2.10* 2.50* 2.65*  2.70* 2.85*  CALCIUM 7.8* 8.3* 7.8* 7.7* 7.7* 7.5*  MG 1.4* 1.9 2.0  --   --   --    GFR: Estimated Creatinine Clearance: 25.3 mL/min (A) (by C-G formula based on SCr of 2.85 mg/dL (H)). Recent Labs  Lab 08/11/22 0730 08/11/22 0739 08/11/22 0939 08/12/22 0843 08/13/22 0930 08/13/22 1900 08/14/22 0630 08/14/22 0751 08/14/22 1924 08/15/22 0552  PROCALCITON 16.20  --   --   --   --   --   --   --   --   --   WBC 6.2  --   --    < >  --    < > 6.5 6.2 9.7 3.9*  LATICACIDVEN  --  6.1* 4.2*  --  1.2  --   --   --   --   --    < > = values in this interval not displayed.    Liver Function Tests: Recent Labs  Lab 08/11/22 0730 08/12/22 0325 08/13/22 0930 08/14/22 0630 08/15/22 0552  AST 32 22 13* 13* 14*  ALT 28 25 21 18 18   ALKPHOS 98 86 81 62 64  BILITOT 2.2* 3.0* 2.4* 2.5* 2.4*  PROT 5.0* 5.6* 5.6* 6.1* 5.1*  ALBUMIN 1.9* 2.8* 3.1* 3.8 3.0*   No results for input(s): "LIPASE", "AMYLASE" in the last 168 hours. Recent Labs  Lab 08/11/22 0730 08/13/22 0930  AMMONIA 24 27    ABG    Component Value Date/Time   HCO3 15.8 (L) 08/13/2022 2240   TCO2 14 (L) 04/30/2022 2034   ACIDBASEDEF 8.8 (H) 08/13/2022 2240   O2SAT 100 08/13/2022 2240     Coagulation Profile: Recent Labs  Lab 08/12/22 1512 08/13/22 0335 08/14/22 0630 08/14/22 1316 08/15/22 0552  INR 3.4* 3.2* 2.5* 2.1* 2.0*    Cardiac Enzymes: No results for input(s): "CKTOTAL", "CKMB", "CKMBINDEX", "TROPONINI" in the last 168 hours.  HbA1C: No results found for: "HGBA1C"  CBG: Recent Labs  Lab 08/14/22 1141 08/14/22 1612 08/14/22 1950 08/14/22 2329 08/15/22 0758  GLUCAP 121* 110* 101* 102* 112*    Critical care time: 45 mins     Melody Comas, MD [facility] Office: [phone]   See Amion for personal pager PCCM on call pager [phone] until 7pm. Please call Elink 7p-7a. [phone]

## 2022-08-15 NOTE — Progress Notes (Signed)
[facility]  Date of Admission:  08/11/2022   Total days of inpatient antibiotics 4  Active Problems:   Hypotension          Assessment: 67 year old female admitted with: # Flavobacterium odoratum bacteremia secondary to left lower extremity cellulitis #Sepsis secondary to #1 #History of straight on the backside cath at home with history of scarring concern for cast rash on admission - Prior blood culture on 4/20 grew 5 Bactrim as a which was considered contaminated. - Blood cultures during this admission grew 2 out of 2 blood Slevin Bactrim. - Initially started on on cefiderocol, transition to IV Merrem based on sensitivities Plan: - Continue meropenem - DC azithromycin as progress note identified in the setting of bacteremia - Plan on 2 weeks antibiotics for bacteremia secondary to complicated skin soft tissue infection - Follow repeat close blood cultures from 8/2 - Cellulitis has significantly improved.  I spoke to her son who is noted that the redness initially started after her hip and traveled downwards. Nash fibrosis with coagulopathy/chronic thrombocytopenia - No signs of SBP  Microbiology:   Antibiotics: Azithromycin 8/1-present Cefepime 8/1-2 Symmetrical 8/3 - 8/4 Meropenem 8/4-pressure Approximate 8/1-present Cultures: Blood 08/11/22 flavobacterium - 8/2 no growth \  SUBJECTIVE: Resting in bed.  Son at bedside. Interval: Afebrile overnight.  Review of Systems: Review of Systems  All other systems reviewed and are negative.    Scheduled Meds:  feeding supplement (PROSource TF20)  60 mL Per Tube Daily   midodrine  15 mg Per Tube TID WC   mouth rinse  15 mL Mouth Rinse 4 times per day   pantoprazole (PROTONIX) IV  40 mg Intravenous Q12H   rifaximin  550 mg Oral BID   sodium chloride flush  10-40 mL Intracatheter Q12H   Continuous Infusions:  sodium chloride 10 mL/hr at 08/15/22 1000   dexmedetomidine (PRECEDEX) IV  infusion 0.5 mcg/kg/hr (08/15/22 1216)   feeding supplement (VITAL 1.5 CAL) 1,000 mL (08/15/22 1207)   meropenem (MERREM) IV Stopped (08/15/22 1610)   promethazine (PHENERGAN) injection (IM or IVPB) Stopped (08/13/22 0540)   sodium bicarbonate 150 mEq in sterile water 1,150 mL infusion 75 mL/hr at 08/15/22 1000   PRN Meds:.acetaminophen, albuterol, liver oil-zinc oxide, mouth rinse, polyethylene glycol, promethazine (PHENERGAN) injection (IM or IVPB), sodium chloride flush Allergies  Allergen Reactions   Ace Inhibitors Anaphylaxis and Swelling   Ivp Dye [Iodinated Contrast Media] Anaphylaxis   Chlorhexidine Rash and Other (See Comments)    WORSENS RASHES   Doxycycline Rash   Penicillins Rash   Zofran [Ondansetron] Rash    OBJECTIVE: Vitals:   08/15/22 1030 08/15/22 1100 08/15/22 1158 08/15/22 1200  BP: 94/61 102/64  (!) 103/56  Pulse: (!) 59 61  (!) 58  Resp: 11 11  11   Temp:   (!) 97.4 F (36.3 C)   TempSrc:   Oral   SpO2: 96% 97%  97%  Weight:      Height:       Body mass index is 50.81 kg/m.  Physical Exam Constitutional:      Appearance: Normal appearance.  HENT:     Head: Normocephalic and atraumatic.     Right Ear: Tympanic membrane normal.     Left Ear: Tympanic membrane normal.     Nose: Nose normal.     Mouth/Throat:     Mouth: Mucous membranes are moist.  Eyes:     Extraocular Movements: Extraocular movements  intact.     Conjunctiva/sclera: Conjunctivae normal.     Pupils: Pupils are equal, round, and reactive to light.  Cardiovascular:     Rate and Rhythm: Normal rate and regular rhythm.     Heart sounds: No murmur heard.    No friction rub. No gallop.  Pulmonary:     Effort: Pulmonary effort is normal.     Breath sounds: Normal breath sounds.  Abdominal:     General: Abdomen is flat.     Palpations: Abdomen is soft.  Skin:    General: Skin is warm and dry.  Neurological:     General: No focal deficit present.     Mental Status: She is alert.   Psychiatric:        Mood and Affect: Mood normal.     Erythema impropved  Lab Results Lab Results  Component Value Date   WBC 3.9 (L) 08/15/2022   HGB 7.3 (L) 08/15/2022   HCT 21.9 (L) 08/15/2022   MCV 90.9 08/15/2022   PLT 58 (L) 08/15/2022    Lab Results  Component Value Date   CREATININE 2.85 (H) 08/15/2022   BUN 76 (H) 08/15/2022   NA 130 (L) 08/15/2022   K 4.2 08/15/2022   CL 103 08/15/2022   CO2 16 (L) 08/15/2022    Lab Results  Component Value Date   ALT 18 08/15/2022   AST 14 (L) 08/15/2022   ALKPHOS 64 08/15/2022   BILITOT 2.4 (H) 08/15/2022        Danelle Earthly, MD [facility] 08/15/2022, 1:12 PM   I have personally spent 55 minutes involved in face-to-face and non-face-to-face activities for this patient on the day of the visit. Professional time spent includes the following activities: Preparing to see the patient (review of tests), Obtaining and/or reviewing separately obtained history (admission/discharge record), Performing a medically appropriate examination and/or evaluation , Ordering medications/tests/procedures, referring and communicating with other health care professionals, Documenting clinical information in the EMR, Independently interpreting results (not separately reported), Communicating results to the patient/family/caregiver, Counseling and educating the patient/family/caregiver and Care coordination (not separately reported).

## 2022-08-16 DIAGNOSIS — E861 Hypovolemia: Secondary | ICD-10-CM | POA: Diagnosis not present

## 2022-08-16 DIAGNOSIS — B9689 Other specified bacterial agents as the cause of diseases classified elsewhere: Secondary | ICD-10-CM | POA: Diagnosis not present

## 2022-08-16 DIAGNOSIS — R7881 Bacteremia: Secondary | ICD-10-CM

## 2022-08-16 DIAGNOSIS — L03116 Cellulitis of left lower limb: Secondary | ICD-10-CM | POA: Diagnosis not present

## 2022-08-16 DIAGNOSIS — Z66 Do not resuscitate: Secondary | ICD-10-CM

## 2022-08-16 DIAGNOSIS — A419 Sepsis, unspecified organism: Secondary | ICD-10-CM | POA: Diagnosis not present

## 2022-08-16 DIAGNOSIS — N179 Acute kidney failure, unspecified: Secondary | ICD-10-CM | POA: Diagnosis not present

## 2022-08-16 DIAGNOSIS — K7460 Unspecified cirrhosis of liver: Secondary | ICD-10-CM | POA: Diagnosis not present

## 2022-08-16 LAB — GLUCOSE, CAPILLARY
Glucose-Capillary: 146 mg/dL — ABNORMAL HIGH (ref 70–99)
Glucose-Capillary: 167 mg/dL — ABNORMAL HIGH (ref 70–99)
Glucose-Capillary: 140 mg/dL — ABNORMAL HIGH (ref 70–99)
Glucose-Capillary: 140 mg/dL — ABNORMAL HIGH (ref 70–99)

## 2022-08-16 MED ORDER — WHITE PETROLATUM EX OINT
TOPICAL_OINTMENT | CUTANEOUS | Status: DC | PRN
  Filled 2022-08-16: qty 5

## 2022-08-16 NOTE — Progress Notes (Signed)
NAME:  Kandace Ririe, MRN:  [id number], DOB:  [birth year], LOS: 5 ADMISSION DATE:  08/11/2022, CONSULTATION DATE:  08/11/22 REFERRING MD:  Adan Sis, PA, CHIEF COMPLAINT:  fatigue/ confusion   History of Present Illness:   67 year old female with PMH as below significant for NASH cirrhosis, HE, morbid obesity, chronic diarrhea, and MDR UTI (e. Coli and Klebsiella) who presents from home with complaints of left leg pain, fatigue, mild confusion, and recent N/V.   Patient lives with her son and cared for by her son, currently at bedside.  He provides most history.  Reports she has been doing better, starting to ambulate some with walker.  Voices some confusion over her diuretic regimen between what [facility], PCP, and prior hospital discharge instructions were.  Has only been taking lasix 20mg  and spironolactone daily.  Has not needed midodrine.  Reports several bouts of N/V on Tuesday, son suspects related to bad food, with ongoing poor PO intake since.  Developed left leg pain yesterday in which she went to urgent care for diagnosed with cellulitis and placed on keflex. Son reports they have a stray kitten at home who frequently scratches them.  Also, patient has frequent itching/ scratching of skin and sinus drainage with clear sputum for about one month.  Reports patient was able to ambulate to the car this morning with her walker.  No further N/V , changes in stool from her chronic diarrhea, no fever, chills, no concern for bleeding.  Denies urinary symptoms.   In ER, afebrile, SBP 70-90's, and room air sats 90 but since improved to normal on room air. Labs significant for normal WBC, H/H 7.5/ 24.6, plts 96, Na 130, sCr 2.41, BUN 37, bicarb 13, albumin 1.9, protein 5, normal LFTs except t. Bili near baseline 2.2, INR 3.4, lactic 6.1> 4.2, ammonia 24, PCT 16.  UA noted for trace leukocytes with 21-50 WBC, 21-50 squamous cells, many bacteria. CXR without acute process.  Cultures sent, treated with vancomycin,  meropenem, and clindamycin.  Given NS bolus with minimal improvement in SBP, therefore placed on low dose peripheral levophed.  Underwent imaging CT chest/ abd/ pelvis, CT left femur and tibia/ fibula found to have symmetric severe/ diffuse circumferential anasarca without focal fluid collection and noted new indeterminate wedge deformity of T5.  Son reports mental status is improved with improved blood pressure.  PCCM called for admit.    Pertinent  Medical History  NASH cirrhosis, HE, morbid obesity, PE not on AC (2014), IDA, GERD, HTN but on prn midodrine, short gut syndrome s/p right colectomy, end ileostomy, subsequent takedown of end ileostomy, small bowel resection in 2009, s/p cholecystectomy, chronic diarrhea, one episode of PAF during one hospitalization MDR UTI, former smoker  Significant Hospital Events: Including procedures, antibiotic start and stop dates in addition to other pertinent events   8/1 Admit with hypotension, possible sepsis/ cellulitis of left leg, AKI, anasarca 8/3 increasing delirium, blood cultures positive for flavobacterium (same as blood cultures in 04/2022) - ID started cefiderocol IV  Interim History / Subjective:  Awake this AM, says "I want to die". She is able to follow commands. Precedex weaned from 0.5 to 0.3. BP intermittently on soft side.   Objective   Blood pressure 116/61, pulse 62, temperature (!) 96.9 F (36.1 C), temperature source Axillary, resp. rate 11, height 5\' 2"  (1.575 m), weight 132.5 kg, SpO2 97%. CVP:  [9 mmHg-24 mmHg] 9 mmHg      Intake/Output Summary (Last 24 hours)  at 08/16/2022 4098 Last data filed at 08/16/2022 1191 Gross per 24 hour  Intake 3115.25 ml  Output 750 ml  Net 2365.25 ml   Filed Weights   08/12/22 0500 08/14/22 0704 08/16/22 0416  Weight: 124.7 kg 126 kg 132.5 kg   Examination: General:  chronically ill appearing morbidly obese female lying in bed, sleeping in NAD HEENT: MM pink/dry, R pupil 4/r, L  opaque (blind), some scleral icterus Neuro: somnolent, but wakes to voice, follows basic commands CV: RRR, +murmur PULM:  non labored, clear anteriorly, diminished in bases GI: soft, bs+, some suprapubic tenderness, purwick Extremities: warm, + edema Skin: Jaundiced. Erythema of left lower extremity - improved  Cr 2.8 Hgb 7.3 Plt 58 INR 2.0 FENA 0.26%   Assessment & Plan:   Shock- multifactorial> Flavobacterium bacteremia, LLE cellulitis (s/p 5 days Azithro to cover possible cat scratch/bartonella), polymicrobial UTI, and hypovolemic, +/- AI component - Also with hx of chronic hypotension, with prn midodrine at home - CT imaging not c/w with focal collection on left leg.  CXR/ CT chest neg for acute process  P: - cont meropenem - Appreciate ID input - follow repeat BCxs - continue midodrine 15mg  TID - stress dose steroids discontinued 8/5 - trend WBC/ fever curve  - left leg cellulitis improving clinically, cont to monitor site closely  - minimal ascites on imaging, without abd pain/ tenderness, defer diagnostic paracentesis for now  AKI - Fena <1, concerning for pre-renal etiology Hyponatremia NAGMA Hypoalbuminemia   - no obstruction or hydronephrosis on CT imaging - baseline ~ sCr 0.8 (May 2024) P:  - Continue Bicarb gtt at 75 - replete electrolytes as needed - trend renal indices  - strict I/Os, daily wts - avoid nephrotoxins, renal dose meds, hemodynamic support as above  NASH cirrhosis, advanced - RUQ US unremarkable Coagulopathy, chronic Anasarca  Chronic diarrhea  GI Bleed - MELD score 32 with 52.6% 3 month mortality.  Does not appear decompensated at this time, more related to sepsis and hypovolemia.  - ammonia at baseline.   P:  - hold lactulose due to increased stool output past few days (normally takes PRN) - cont rifaximin - follows with [facility]> plans/ needs EGD for variceal screening and recently referred to hepatology.   - IV pantoprazole 40mg   BID  Chronic normocytic anemia and thrombocytopenia  - baseline H/H runs 7-9, admit Hgb 7.5.  - T&S sent 8/1 P:  - cont SCDs for now - Transfuse for Hgb < 7  Hx HTN HFpEF Aortic stenosis - prior TTE 06/2022> EF 60-65%, G1DD, mod AS P:  - monitor  GERD - cont PPI   Hypothyroidism - cont synthroid  Depression - cont venlafaxine  Morbid obesity - encourage outpt wt loss evaluation  Thoracic spine, T5 wedge deformity - pt reports some chronic back pain, denies injury.  States follows with ortho. F/u OP   GOC - son reports she has a DNR at home.  Pt verified DNR status. Son updated code status evening 8/5 and OK with intubation if needed along with aggressive medical management otherwise. Will revisit with son today as pt does not appear to have same wishes based on her conversations this morning (stating "I want to die").  Best Practice (right click and "Reselect all SmartList Selections" daily)   Diet/type: tubefeeds DVT prophylaxis: SCD GI prophylaxis: PPI Lines: N/A Foley:  N/A Code Status:  DNR. Evening 8/5, son stated OK with intubation. Will revisit with him today once he arrives and  try to involve pt in this discussion and decision Last date of multidisciplinary goals of care discussion [8/1]  Patient son updated at bedside 8/5   Critical care time: 45 mins     Celine Mans, [place] [facility] For pager details, please see AMION or use Epic chat  After 1900, please call [facility] for cross coverage needs 08/16/2022, 8:56 AM

## 2022-08-16 NOTE — Progress Notes (Signed)
Pharmacy Antibiotic Note - Follow-Up [name] is a 67 y.o. female admitted on 08/11/2022 with concern for sepsis 2/2 LLE cellulitis.  Pharmacy has been consulted for meropenem dosing.  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  Today, 08/16/2022: - Afebrile, no leukocytosis, occasionally soft BP on midodrine - SCr improved to 2.58  Plan: Continue meropenem 1g IV Q12H to complete 14 days of tx Follow up renal function, culture results, and clinical course.  Height: 5\' 2"  (157.5 cm) Weight: 132.5 kg (292 lb 1.8 oz) IBW/kg (Calculated) : 50.1  Temp (24hrs), Avg:96.7 F (35.9 C), Min:96 F (35.6 C), Max:97.5 F (36.4 C)  Recent Labs  Lab 08/11/22 0739 08/11/22 0939 08/12/22 0325 08/13/22 0335 08/13/22 0930 08/13/22 1900 08/14/22 0630 08/14/22 0751 08/14/22 1924 08/15/22 0552 08/16/22 0406  WBC  --   --    < > 21.3*  --  19.7* 6.5 6.2 9.7 3.9* 7.0  CREATININE  --   --    < > 2.50*  --  2.65* 2.70*  --   --  2.85* 2.58*  LATICACIDVEN 6.1* 4.2*  --   --  1.2  --   --   --   --   --   --    < > = values in this interval not displayed.    Estimated Creatinine Clearance: 28.9 mL/min (A) (by C-G formula based on SCr of 2.58 mg/dL (H)).    Allergies  Allergen Reactions   Ace Inhibitors Anaphylaxis and Swelling   Ivp Dye [Iodinated Contrast Media] Anaphylaxis   Chlorhexidine Rash and Other (See Comments)    WORSENS RASHES   Doxycycline Rash   Penicillins Rash   Zofran [Ondansetron] Rash    Antimicrobials this admission: Meropenem 8/1; 8/4 >> Azithromycin 8/1 >> 8/5 Cefiderocol 8/3 >> 8/4 Cefepime 8/1 >> 8/2 Vancomycin 8/1 Clindamycin 8/1  Microbiology results: 8/2 Bcx: ngtd4 8/1 Resp panel: covid, flu, rsv: negative 8/1 BCx: 2/4  flavobacterium odoratum (only S to carbapenems) 8/1 UA: many bacteria, 21-50 epithelial cells, 21-50 WBC 8/1 UCx: multiple species present; recollection suggested 8/1 MRSA PCR:  negative  Thank you for allowing pharmacy to be a part of this patient's care.  Nicole Kindred, PharmD PGY1 Pharmacy Resident 08/16/2022 1:20 PM

## 2022-08-16 NOTE — Progress Notes (Signed)
Spoke with RN about ABG ordered.  She said patient didn't need an ABG and she would have it cancelled.

## 2022-08-16 NOTE — Progress Notes (Signed)
eLink Physician-Brief Progress Note Patient Name: [name] DOB: 30-Apr-1955 MRN: [id number]   Date of Service  08/16/2022  HPI/Events of Note  67 year old female who initially presented with not cirrhosis, portal hypertension, ascites and hepatic encephalopathy with chronic diarrhea from short gut he was initially admitted for septic shock with gram-negative bacteremia and persistent acidosis.  In the setting of persistent acidosis, she has been on continuous bicarbonate infusion with moderate improvement.  The patient appears severely overloaded but edema.  The edema is now starting to cause discomfort.  Bicarb this morning at 18.  Hemoglobin of 8.  eICU Interventions  Hold fluids.  Currently not on diuretics.  Would continue to hold until day team can reevaluate.  As needed acetaminophen for mild-moderate pain.   7829 - add mag sulfate  Intervention Category Intermediate Interventions: Hypervolemia - evaluation and management Minor Interventions: Clinical assessment - ordering diagnostic tests    08/16/2022, 9:08 PM

## 2022-08-16 NOTE — Progress Notes (Signed)
PHARMACY CONSULT NOTE FOR:  OUTPATIENT  PARENTERAL ANTIBIOTIC THERAPY (OPAT)  Indication: Fusobacterium/myroides bacteremia Regimen: Meropenem 1g IV Q12H End date: 08/27/2022  IV antibiotic discharge orders are pended. To discharging provider:  please sign these orders via discharge navigator,  Select New Orders & click on the button choice - Manage This Unsigned Work.   Thank you for allowing pharmacy to be a part of this patient's care.  Nicole Kindred, PharmD PGY1 Pharmacy Resident 08/16/2022 2:53 PM

## 2022-08-16 NOTE — Plan of Care (Signed)

## 2022-08-17 DIAGNOSIS — K7290 Hepatic failure, unspecified without coma: Secondary | ICD-10-CM | POA: Diagnosis not present

## 2022-08-17 DIAGNOSIS — Z7189 Other specified counseling: Secondary | ICD-10-CM | POA: Diagnosis not present

## 2022-08-17 DIAGNOSIS — R5381 Other malaise: Secondary | ICD-10-CM

## 2022-08-17 DIAGNOSIS — K7460 Unspecified cirrhosis of liver: Secondary | ICD-10-CM

## 2022-08-17 DIAGNOSIS — A419 Sepsis, unspecified organism: Secondary | ICD-10-CM | POA: Diagnosis not present

## 2022-08-17 DIAGNOSIS — R6521 Severe sepsis with septic shock: Secondary | ICD-10-CM | POA: Diagnosis not present

## 2022-08-17 LAB — MAGNESIUM: Magnesium: 1.9 mg/dL (ref 1.7–2.4)

## 2022-08-17 LAB — GLUCOSE, CAPILLARY
Glucose-Capillary: 150 mg/dL — ABNORMAL HIGH (ref 70–99)
Glucose-Capillary: 133 mg/dL — ABNORMAL HIGH (ref 70–99)
Glucose-Capillary: 98 mg/dL (ref 70–99)
Glucose-Capillary: 113 mg/dL — ABNORMAL HIGH (ref 70–99)
Glucose-Capillary: 89 mg/dL (ref 70–99)

## 2022-08-17 MED ORDER — PHENOL 1.4 % MT LIQD
1.0000 | OROMUCOSAL | Status: DC | PRN
  Filled 2022-08-17: qty 177

## 2022-08-17 MED ORDER — CARMEX CLASSIC LIP BALM EX OINT
1.0000 | TOPICAL_OINTMENT | CUTANEOUS | Status: DC | PRN
  Administered 2022-08-21: 1 via TOPICAL
  Filled 2022-08-17 (×2): qty 10

## 2022-08-17 MED ORDER — MAGNESIUM SULFATE 2 GM/50ML IV SOLN
2.0000 g | Freq: Once | INTRAVENOUS | Status: AC
  Administered 2022-08-17: 2 g via INTRAVENOUS
  Filled 2022-08-17: qty 50

## 2022-08-17 MED ORDER — POTASSIUM CHLORIDE 20 MEQ PO PACK
20.0000 meq | PACK | Freq: Once | ORAL | Status: AC
  Administered 2022-08-17: 20 meq
  Filled 2022-08-17: qty 1

## 2022-08-17 MED ORDER — MIDODRINE HCL 5 MG PO TABS
15.0000 mg | ORAL_TABLET | Freq: Three times a day (TID) | ORAL | Status: DC
  Filled 2022-08-17: qty 3

## 2022-08-17 MED ORDER — SODIUM BICARBONATE 650 MG PO TABS
650.0000 mg | ORAL_TABLET | Freq: Three times a day (TID) | ORAL | Status: DC
  Administered 2022-08-17 (×2): 650 mg
  Filled 2022-08-17 (×2): qty 1

## 2022-08-17 MED ORDER — SODIUM BICARBONATE 650 MG PO TABS
650.0000 mg | ORAL_TABLET | Freq: Three times a day (TID) | ORAL | Status: AC
  Administered 2022-08-17 – 2022-08-19 (×7): 650 mg via ORAL
  Filled 2022-08-17 (×7): qty 1

## 2022-08-17 NOTE — Evaluation (Signed)
Clinical/Bedside Swallow Evaluation Patient Details  Name: [name] MRN: [id number] Date of Birth: 1955-08-26  Today's Date: 08/17/2022 Time: SLP Start Time (ACUTE ONLY): 1610 SLP Stop Time (ACUTE ONLY): 1645 SLP Time Calculation (min) (ACUTE ONLY): 35 min  Past Medical History:  Past Medical History:  Diagnosis Date   Chronic diarrhea    Cirrhosis, 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   pt stated on admission history review   Heart murmur    Hypertension    Kidney stone    Obesity    Pulmonary embolism (HCC)    Short gut syndrome    Thyroid disease    Past Surgical History:  Past Surgical History:  Procedure Laterality Date   CHOLECYSTECTOMY     COLON SURGERY     HERNIA REPAIR     ILEOSTOMY     ILEOSTOMY CLOSURE     KIDNEY STONE SURGERY     WRIST SURGERY     HPI:  67 year old female with PMH as below significant for NASH cirrhosis, HE, morbid obesity, chronic diarrhea, and MDR UTI (e. Coli and Klebsiella) who presents from home with complaints of left leg pain, fatigue, mild confusion, and recent N/V.  Critical care service note  8/1  Admit with hypotension, possible sepsis/ cellulitis of left leg, AKI, anasarca, 8/3 increasing delirium, blood cultures positive for flavobacterium (same as blood cultures in 04/2022) - ID started cefiderocol IV , 8/6 ID s/o, recs for mero to complete 8/17 , 8/7 weaning precedex.     SLP received order for swallow evaluation indicating MD wishes to expedite NG removal.  CT imaging showed Cirrhosis with evidence of portal hypertension including mild  splenomegaly, large retroperitoneal and mesenteric varices, and  small volume ascites throughout the abdomen and pelvis.    Chest imaging showed trace pleural effusion.    Assessment / Plan / Recommendation  Clinical Impression  Patient greeted sitting upright in bed, alert and cooperative.  She denies h/o dysphagia/coughing with po - admits to occasional cough with ice chip consumption. CN exam  unremarkable - pt has few areas of red round raised areas mid tongue in vertically - no pain with palpation or swallowing. She does admit to displeasure with taste of sweet items during testing - causing SLP to question source? Pt observed self feeding liquids (water, tea) and solids (salad, graham cracker, cookie) with adequate mastication and intact timing of swallow/respirations.  She did not pass Yale water challenge due to subtle cough approx 20 seconds after swallowing but did not demonstrate any further coughing with all po  Pt is using caution with her intake, which SLP advised given her prolonged hospital stay.  Her voice and cough are strong  and her swallow ability clinically intact.  Recommend continue diet as tolerated -  Of note, upon return to pt's room to post swallow precaution sign, she reports issues with sensing retnetion in pharynx *pointing distally. Frequent eructation noted during intake but pt denies refluxing.   Given erythemic raised areas on tongue, gustatory changes, "failure" of 3 ounce Yale water challenge and pt report of sensation of food lodging in pharynx, will follow up x1 to assure tolerance .  Hope to assess pt with a full meal after small bore feeding tube removed.   Thanks for this consult.    Using teach back, pt educated to recommendations and agreeable to plan. SLP Visit Diagnosis: Dysphagia, unspecified (R13.10)    Aspiration Risk  Mild aspiration risk    Diet Recommendation Regular;Thin liquid  Liquid Administration via: Cup;Straw Medication Administration: Whole meds with liquid Supervision: Patient able to self feed Compensations: Slow rate;Small sips/bites Postural Changes: Remain upright for at least 30 minutes after po intake;Seated upright at 90 degrees    Other  Recommendations Oral Care Recommendations: Oral care BID    Recommendations for follow up therapy are one component of a multi-disciplinary discharge planning process, led by the attending  physician.  Recommendations may be updated based on patient status, additional functional criteria and insurance authorization.  Follow up Recommendations No SLP follow up      Assistance Recommended at Discharge  TBD  Functional Status Assessment Patient has had a recent decline in their functional status and demonstrates the ability to make significant improvements in function in a reasonable and predictable amount of time.  Frequency and Duration min 1 x/week  1 week       Prognosis Prognosis for improved oropharyngeal function: Good      Swallow Study   General Date of Onset: 08/17/22 HPI: 67 year old female with PMH as below significant for NASH cirrhosis, HE, morbid obesity, chronic diarrhea, and MDR UTI (e. Coli and Klebsiella) who presents from home with complaints of left leg pain, fatigue, mild confusion, and recent N/V.  Critical care service note  8/1  Admit with hypotension, possible sepsis/ cellulitis of left leg, AKI, anasarca, 8/3 increasing delirium, blood cultures positive for flavobacterium (same as blood cultures in 04/2022) - ID started cefiderocol IV , 8/6 ID s/o, recs for mero to complete 8/17 , 8/7 weaning precedex.     SLP received order for swallow evaluation indicating MD wishes to expedite NG removal.  CT imaging showed Cirrhosis with evidence of portal hypertension including mild  splenomegaly, large retroperitoneal and mesenteric varices, and  small volume ascites throughout the abdomen and pelvis.    Chest imaging showed trace pleural effusion. Type of Study: Bedside Swallow Evaluation Diet Prior to this Study: Regular;Thin liquids (Level 0) Temperature Spikes Noted: No Respiratory Status: Room air History of Recent Intubation: No Behavior/Cognition: Alert;Cooperative;Pleasant mood Oral Cavity Assessment: Other (comment) (tongue with appearance of red round raised areas midline - no pain per pt with palpation or po intake, pt does not recall if they were  present PTA, has h/o "biting her tongue" in the past) Oral Care Completed by SLP: Recent completion by staff Oral Cavity - Dentition: Adequate natural dentition Vision: Functional for self-feeding (set up assist, has reading glasses) Self-Feeding Abilities: Able to feed self Patient Positioning: Upright in bed Baseline Vocal Quality: Normal Volitional Cough: Strong Volitional Swallow: Able to elicit    Oral/Motor/Sensory Function Overall Oral Motor/Sensory Function: Within functional limits   Ice Chips Ice chips: Within functional limits Presentation: Spoon   Thin Liquid Presentation: Cup;Self Fed;Straw Other Comments: pt did not pass 3 ounce Yale swallow screen due to subtle cough within 20 seconds of 3 water consumption, but no furhter episodes of coughing with small boluses of liquids    Nectar Thick Nectar Thick Liquid: Not tested   Honey Thick Honey Thick Liquid: Not tested   Puree Puree: Not tested   Solid     Solid: Within functional limits Presentation: Self Orvan July 08/17/2022,6:09 PM  Rolena Infante, MS [facility] SLP Acute Rehab Services Office [phone]

## 2022-08-17 NOTE — Consult Note (Incomplete)
                                                                                   Consultation Note Date: 08/17/2022   Patient Name: [name]  DOB: [birth year]  MRN: [id number]  Age / Sex: 67 y.o., female  PCP: [name], MD Referring Physician: Martina Sinner, MD  Reason for Consultation:    HPI/Patient Profile: 67 y.o. female  with past medical history of *** admitted on 08/11/2022 with ***.   Primary Decision Maker {Primary Decision NWGNF:62130}  Discussion: ***    SUMMARY OF RECOMMENDATIONS   *** Code Status/Advance Care Planning: {Palliative Code status:23503}   Prognosis:   {Palliative Care Prognosis:23504}  Discharge Planning: {Palliative dispostion:23505}  Primary Diagnoses: Present on Admission: . Hypotension   Review of Systems  Unable to perform ROS   Physical Exam  Vital Signs: BP (!) 108/48   Pulse (!) 59   Temp (!) 97.1 F (36.2 C) (Axillary)   Resp 12   Ht 5\' 2"  (1.575 m)   Wt 131.9 kg   SpO2 97%   BMI 53.19 kg/m  Pain Scale: 0-10   Pain Score: Asleep   SpO2: SpO2: 97 % O2 Device:SpO2: 97 % O2 Flow Rate: .   IO: Intake/output summary:  Intake/Output Summary (Last 24 hours) at 08/17/2022 1659 Last data filed at 08/17/2022 1300 Gross per 24 hour  Intake 2141.97 ml  Output 1130 ml  Net 1011.97 ml    LBM: Last BM Date : 08/17/22 Baseline Weight: Weight: 106.6 kg Most recent weight: Weight: 131.9 kg       Thank you for this consult. Palliative medicine will continue to follow and assist as needed.  Time Total: *** Greater than 50%  of this time was spent counseling and coordinating care related to the above assessment and plan.  Signed by: Ocie Bob, AGNP-C Palliative Medicine    Please contact Palliative Medicine Team phone at [phone] for questions and concerns.  For individual provider: See Loretha Stapler

## 2022-08-17 NOTE — Plan of Care (Signed)
  Problem: Clinical Measurements: Goal: Ability to maintain clinical measurements within normal limits will improve Outcome: Progressing Goal: Diagnostic test results will improve Outcome: Progressing   Problem: Nutrition: Goal: Adequate nutrition will be maintained Outcome: Progressing   Problem: Coping: Goal: Level of anxiety will decrease Outcome: Progressing   Problem: Pain Managment: Goal: General experience of comfort will improve Outcome: Progressing   Problem: Elimination: Goal: Will not experience complications related to urinary retention Outcome: Not Progressing

## 2022-08-17 NOTE — Progress Notes (Addendum)
NAME:  [name], MRN:  [id number], DOB:  31-Jan-1955, LOS: 6 ADMISSION DATE:  08/11/2022, CONSULTATION DATE:  08/11/22 REFERRING MD:  Adan Sis, PA, CHIEF COMPLAINT:  fatigue/ confusion   History of Present Illness:   67 year old female with PMH as below significant for NASH cirrhosis, HE, morbid obesity, chronic diarrhea, and MDR UTI (e. Coli and Klebsiella) who presents from home with complaints of left leg pain, fatigue, mild confusion, and recent N/V.   Patient lives with her son and cared for by her son, currently at bedside.  He provides most history.  Reports she has been doing better, starting to ambulate some with walker.  Voices some confusion over her diuretic regimen between what [facility], PCP, and prior hospital discharge instructions were.  Has only been taking lasix 20mg  and spironolactone daily.  Has not needed midodrine.  Reports several bouts of N/V on Tuesday, son suspects related to bad food, with ongoing poor PO intake since.  Developed left leg pain yesterday in which she went to urgent care for diagnosed with cellulitis and placed on keflex. Son reports they have a stray kitten at home who frequently scratches them.  Also, patient has frequent itching/ scratching of skin and sinus drainage with clear sputum for about one month.  Reports patient was able to ambulate to the car this morning with her walker.  No further N/V , changes in stool from her chronic diarrhea, no fever, chills, no concern for bleeding.  Denies urinary symptoms.   In ER, afebrile, SBP 70-90's, and room air sats 90 but since improved to normal on room air. Labs significant for normal WBC, H/H 7.5/ 24.6, plts 96, Na 130, sCr 2.41, BUN 37, bicarb 13, albumin 1.9, protein 5, normal LFTs except t. Bili near baseline 2.2, INR 3.4, lactic 6.1> 4.2, ammonia 24, PCT 16.  UA noted for trace leukocytes with 21-50 WBC, 21-50 squamous cells, many bacteria. CXR without acute process.  Cultures sent, treated with vancomycin,  meropenem, and clindamycin.  Given NS bolus with minimal improvement in SBP, therefore placed on low dose peripheral levophed.  Underwent imaging CT chest/ abd/ pelvis, CT left femur and tibia/ fibula found to have symmetric severe/ diffuse circumferential anasarca without focal fluid collection and noted new indeterminate wedge deformity of T5.  Son reports mental status is improved with improved blood pressure.  PCCM called for admit.    Pertinent  Medical History  NASH cirrhosis, HE, morbid obesity, PE not on AC (2014), IDA, GERD, HTN but on prn midodrine, short gut syndrome s/p right colectomy, end ileostomy, subsequent takedown of end ileostomy, small bowel resection in 2009, s/p cholecystectomy, chronic diarrhea, one episode of PAF during one hospitalization MDR UTI, former smoker  Significant Hospital Events: Including procedures, antibiotic start and stop dates in addition to other pertinent events   8/1 Admit with hypotension, possible sepsis/ cellulitis of left leg, AKI, anasarca 8/3 increasing delirium, blood cultures positive for flavobacterium (same as blood cultures in 04/2022) - ID started cefiderocol IV 8/6 ID s/o, recs for mero to complete 8/17  8/7 weaning precedex.   Interim History / Subjective:  Weaning precedex Son at bedside   Objective   Blood pressure (!) 92/37, pulse (!) 57, temperature (!) 97.5 F (36.4 C), temperature source Oral, resp. rate 13, height 5\' 2"  (1.575 m), weight 131.9 kg, SpO2 95%. CVP:  [9 mmHg-31 mmHg] 19 mmHg      Intake/Output Summary (Last 24 hours) at 08/17/2022 1034 Last  data filed at 08/17/2022 0901 Gross per 24 hour  Intake 1910.89 ml  Output 1400 ml  Net 510.89 ml   Filed Weights   08/14/22 0704 08/16/22 0416 08/17/22 0446  Weight: 126 kg 132.5 kg 131.9 kg   Examination: General: Chronically and acutely ill F NAD HEENT: NCAT. Chronically opaque L eye.  Neuro: Awake oriented x2, following commands   CV: rr cap refill is < 3  sec  PULM:  Symmetrical chest expansion, even and unlabored.  GI: obese soft  Extremities: edematous, no acute joint deformity. Toenail fungus.  Skin: Jaundice. Warm, dry     Assessment & Plan:   Acute encephalopathy, multifactorial -AKI, septic shock, delirium P -tx metabolic factors as below -delirium precautions -wean precedex, hopefully off 8/7   Septic shock -- flavobacterium bacteremia, LLE cellulitis, polymicrobial UTI  -s/p 5d azithro to cover possible cat scratch/bartonella  -component of chronic hypotension, on PRN midodrine at home in context of NASH cirrhosis  P: -cont mero, complete 8/17  -following repeat cx (sent 8/2, no growth thus far. 8/1 with flavobacterium)  -midodrine 15mg  TID   NASH cirrhosis, decompensated  Coagulopathy, anemia, thrombocytopenia  Anasarca Chronic diarrhea  GIB -minimal ascites on imaging - MELD score 32 with 52.6% 3 month mortality.   P -cont rifaxan  - PRN LFTs, coags  -AM CBC -BID ppi -follows with [facility], needs EGD for variceal screening, was recently referred to hepatology   AKI with uremia  NAGMA  Hyperphosphatemia Borderline hypomagnesemia  Borderline hypokalemia  - no obstruction or hydronephrosis on CT imaging, likely pre-renal  - baseline ~ sCr 0.8 (May 2024) P:  - completed bicarb gtt, will add 3d of bicarb per tube  - trend renal indices, UOP  -replace mag, K   Hx HTN HFpEF Aortic stenosis - prior TTE 06/2022> EF 60-65%, G1DD, mod AS P:  - holding antihypertensives, on midodrine as above   Physical deconditioning Morbid obesity  P -PT/OT   Hypothyroidism -synthroid  Depression - effexor   Thoracic spine, T5 wedge deformity -ortho fu   GOC DNR status -is DNR but would like intubation. We discussed this some 8/7 -- I advised consideration of DNR/I as I do not think Heyley would successfully liberate from MV. They are thinking about this  -they are clear that she would not want extended MV, no  trach.  -palliative consult placed 8/6, I support this. She has been through a lot -- nearly monthly hospitalizations since Feb.   Best Practice (right click and "Reselect all SmartList Selections" daily)   Diet/type: tubefeeds DVT prophylaxis: SCD GI prophylaxis: PPI Lines: N/A Foley:  N/A Code Status:  DNR. Intubation ok   Patient son updated at bedside 8/7  CRITICAL CARE Performed by: Lanier Clam   Total critical care time: 42 minutes  Critical care time was exclusive of separately billable procedures and treating other patients. Critical care was necessary to treat or prevent imminent or life-threatening deterioration.  Critical care was time spent personally by me on the following activities: development of treatment plan with patient and/or surrogate as well as nursing, discussions with consultants, evaluation of patient's response to treatment, examination of patient, obtaining history from patient or surrogate, ordering and performing treatments and interventions, ordering and review of laboratory studies, ordering and review of radiographic studies, pulse oximetry and re-evaluation of patient's condition.  Tessie Fass MSN, AGACNP-BC [facility] Pulmonary/Critical Care Medicine Amion for pager  08/17/2022, 10:34 AM

## 2022-08-17 NOTE — Plan of Care (Signed)
  Problem: Clinical Measurements: Goal: Ability to maintain clinical measurements within normal limits will improve Outcome: Progressing Goal: Respiratory complications will improve Outcome: Progressing Goal: Cardiovascular complication will be avoided Outcome: Progressing   Problem: Nutrition: Goal: Adequate nutrition will be maintained Outcome: Progressing   Problem: Coping: Goal: Level of anxiety will decrease Outcome: Progressing   Problem: Elimination: Goal: Will not experience complications related to bowel motility Outcome: Progressing   Problem: Pain Managment: Goal: General experience of comfort will improve Outcome: Progressing

## 2022-08-17 NOTE — Progress Notes (Signed)
eLink Physician-Brief Progress Note Patient Name: [name] DOB: [birth year] MRN: [id number]   Date of Service  08/17/2022  HPI/Events of Note  67 year old female who initially presented with not cirrhosis, portal hypertension, ascites and hepatic encephalopathy with chronic diarrhea from short gut he was initially admitted for septic shock with gram-negative bacteremia and persistent acidosis. In the setting of persistent acidosis,   Advance to regular diet today after speech eval.  NG tube is bothering the patient  eICU Interventions  Remove NG tube   0302 - Ongoing anxiety and difficulty sleeping.  Takes hydroxyzine at home.  Trying and anxiety.  Will resume home med.  Intervention Category Minor Interventions: Routine modifications to care plan (e.g. PRN medications for pain, fever)    08/17/2022, 10:52 PM

## 2022-08-17 NOTE — Progress Notes (Incomplete)
Daily Progress Note   Patient Name: [name]       Date: 08/17/2022 DOB: 01/11/1955  Age: 67 y.o. MRN#: [id number] Attending Physician: Martina Sinner, MD Primary Care Physician: [name], MD Admit Date: 08/11/2022  Reason for Consultation/Follow-up: {Reason for Consult:23484}  Patient Profile/HPI:  Subjective: *** ROS   Physical Exam          Vital Signs: BP (!) 108/48   Pulse (!) 59   Temp 97.6 F (36.4 C) (Oral)   Resp 12   Ht 5\' 2"  (1.575 m)   Wt 131.9 kg   SpO2 97%   BMI 53.19 kg/m  SpO2: SpO2: 97 % O2 Device: O2 Device: Room Air O2 Flow Rate:    Intake/output summary:  Intake/Output Summary (Last 24 hours) at 08/17/2022 1621 Last data filed at 08/17/2022 1300 Gross per 24 hour  Intake 2191.97 ml  Output 1130 ml  Net 1061.97 ml   LBM: Last BM Date : 08/17/22 Baseline Weight: Weight: 106.6 kg Most recent weight: Weight: 131.9 kg       Palliative Assessment/Data:      Patient Active Problem List   Diagnosis Date Noted   Physical deconditioning 08/17/2022   Goals of care, counseling/discussion 08/17/2022   Decompensated hepatic cirrhosis (HCC) 08/17/2022   Septic shock (HCC) 08/17/2022   Hypotension 08/11/2022   Folate deficiency 06/14/2022   Acute metabolic encephalopathy 06/13/2022   Systolic murmur 06/12/2022   Iron deficiency anemia 05/16/2022   History of eye surgery 05/01/2022   Obesity, Class III, BMI 40-49.9 (morbid obesity) (HCC) 05/01/2022   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   GERD (gastroesophageal reflux disease) 07/25/2016   Hypothyroidism 07/25/2016    Palliative Care Assessment & Plan    Assessment/Recommendations/Plan  ***   Code Status: {Palliative Code status:23503}  Prognosis:  {Palliative Care  Prognosis:23504}  Discharge Planning: {Palliative dispostion:23505}  Care plan was discussed with ***  Thank you for allowing the Palliative Medicine Team to assist in the care of this patient.  Total time:  Prolonged billing:      Greater than 50%  of this time was spent counseling and coordinating care related to the above assessment and plan.  Ocie Bob, AGNP-C Palliative Medicine   Please contact Palliative Medicine Team phone at [phone] for questions and concerns.

## 2022-08-17 NOTE — Progress Notes (Signed)
[facility]  Date of Admission:  08/11/2022   Total days of inpatient antibiotics 4  Active Problems:   Hypotension          Assessment: 67 year old female admitted with: # Flavobacterium odoratum bacteremia secondary to left lower extremity cellulitis #Sepsis secondary to #1 #History of straight on the backside cath at home with history of scarring concern for cast rash on admission - Prior blood culture on 4/20 grew 5 Bactrim as a which was considered contaminated. - Blood cultures during this admission grew 2 out of 2 blood Slevin Bactrim. - Initially started on on cefiderocol, transition to IV Merrem based on sensitivities Plan: - Continue meropenem - Plan on 2 weeks antibiotics for bacteremia secondary to complicated skin soft tissue infection from merrem start date(8/4) EOT 8/17 - Blood Cx form 8/2 NG  #Nash fibrosis with coagulopathy/chronic thrombocytopenia - No signs of SBP   ID will sign off  OPAT ORDERS:  Diagnosis: Flavobacterium odoratum bacteremia   Culture Result: 08/11/22 flavobacterium  Allergies  Allergen Reactions   Ace Inhibitors Anaphylaxis and Swelling   Ivp Dye [Iodinated Contrast Media] Anaphylaxis   Chlorhexidine Rash and Other (See Comments)    WORSENS RASHES   Doxycycline Rash   Penicillins Rash   Zofran [Ondansetron] Rash     Discharge antibiotics to be given via PICC line:  Per pharmacy protocol merrem 1g q12h    Duration: 2 weeks End Date: 8/17  [facility] Care Per Protocol with [employer] Use: Home health RN for IV administration and teaching, line care and labs.    Labs weekly while on IV antibiotics: __x CBC with differential __ BMP **TWICE WEEKLY ON VANCOMYCIN  _x_ CMP __ CRP __ ESR __ Vancomycin trough TWICE WEEKLY __ CK  xxxx__ Please pull PIC at completion of IV antibiotics __ Please leave PIC in place until doctor has seen patient or been notified  Fax weekly labs to [phone]  Clinic Follow Up Appt: 8/15  @ [facility] with Marcos Eke  Microbiology:   Antibiotics: Azithromycin 8/1--8/5 Cefepime 8/1-2 Symmetrical 8/3 - 8/4 Meropenem 8/4-pressure Rifaxamin 8/1-present Cultures: Blood 08/11/22 flavobacterium  8/2 no growth   SUBJECTIVE: Resting in bed, pt more alert today.  Son at bedside. Interval: Tmin 96.9 overnight  Review of Systems: Review of Systems  All other systems reviewed and are negative.    Scheduled Meds:  feeding supplement (PROSource TF20)  60 mL Per Tube Daily   midodrine  15 mg Per Tube TID WC   mouth rinse  15 mL Mouth Rinse 4 times per day   pantoprazole (PROTONIX) IV  40 mg Intravenous Q12H   rifaximin  550 mg Oral BID   sodium chloride flush  10-40 mL Intracatheter Q12H   Continuous Infusions:  sodium chloride 10 mL/hr at 08/17/22 0448   dexmedetomidine (PRECEDEX) IV infusion 0.3 mcg/kg/hr (08/17/22 0406)   feeding supplement (VITAL 1.5 CAL) 50 mL/hr at 08/17/22 0406   meropenem (MERREM) IV Stopped (08/17/22 0342)   promethazine (PHENERGAN) injection (IM or IVPB) Stopped (08/16/22 2310)   PRN Meds:.acetaminophen, albuterol, liver oil-zinc oxide, mouth rinse, polyethylene glycol, promethazine (PHENERGAN) injection (IM or IVPB), sodium chloride flush, white petrolatum Allergies  Allergen Reactions   Ace Inhibitors Anaphylaxis and Swelling   Ivp Dye [Iodinated Contrast Media] Anaphylaxis   Chlorhexidine Rash and Other (See Comments)    WORSENS RASHES   Doxycycline Rash   Penicillins Rash   Zofran [Ondansetron] Rash  OBJECTIVE: Vitals:   08/17/22 0000 08/17/22 0344 08/17/22 0400 08/17/22 0446  BP: 133/66  (!) 102/52   Pulse: (!) 53  66   Resp: 11  13   Temp: (!) 97.5 F (36.4 C) 97.7 F (36.5 C)    TempSrc: Axillary Axillary    SpO2: 96%  97%   Weight:    131.9 kg  Height:       Body mass index is 53.19 kg/m.  Physical Exam Constitutional:      Appearance: Normal appearance.  HENT:     Head:  Normocephalic and atraumatic.     Right Ear: Tympanic membrane normal.     Left Ear: Tympanic membrane normal.     Nose: Nose normal.     Mouth/Throat:     Mouth: Mucous membranes are moist.  Eyes:     Extraocular Movements: Extraocular movements intact.     Conjunctiva/sclera: Conjunctivae normal.     Pupils: Pupils are equal, round, and reactive to light.  Cardiovascular:     Rate and Rhythm: Normal rate and regular rhythm.     Heart sounds: No murmur heard.    No friction rub. No gallop.  Pulmonary:     Effort: Pulmonary effort is normal.     Breath sounds: Normal breath sounds.  Abdominal:     General: Abdomen is flat.     Palpations: Abdomen is soft.  Skin:    General: Skin is warm and dry.  Neurological:     General: No focal deficit present.     Mental Status: She is alert.  Psychiatric:        Mood and Affect: Mood normal.     Erythema impropved  Lab Results Lab Results  Component Value Date   WBC 6.0 08/17/2022   HGB 7.5 (L) 08/17/2022   HCT 23.2 (L) 08/17/2022   MCV 91.7 08/17/2022   PLT 74 (L) 08/17/2022    Lab Results  Component Value Date   CREATININE 2.30 (H) 08/17/2022   BUN 92 (H) 08/17/2022   NA 135 08/17/2022   K 3.6 08/17/2022   CL 106 08/17/2022   CO2 19 (L) 08/17/2022    Lab Results  Component Value Date   ALT 17 08/16/2022   AST 17 08/16/2022   ALKPHOS 69 08/16/2022   BILITOT 2.3 (H) 08/16/2022        Danelle Earthly, MD [facility] 08/17/2022, 6:22 AM   I have personally spent 54 minutes involved in face-to-face and non-face-to-face activities for this patient on the day of the visit. Professional time spent includes the following activities: Preparing to see the patient (review of tests), Obtaining and/or reviewing separately obtained history (admission/discharge record), Performing a medically appropriate examination and/or evaluation , Ordering medications/tests/procedures,  referring and communicating with other health care professionals, Documenting clinical information in the EMR, Independently interpreting results (not separately reported), Communicating results to the patient/family/caregiver, Counseling and educating the patient/family/caregiver and Care coordination (not separately reported).

## 2022-08-18 DIAGNOSIS — R6521 Severe sepsis with septic shock: Secondary | ICD-10-CM | POA: Diagnosis not present

## 2022-08-18 DIAGNOSIS — N3289 Other specified disorders of bladder: Secondary | ICD-10-CM

## 2022-08-18 DIAGNOSIS — A419 Sepsis, unspecified organism: Secondary | ICD-10-CM

## 2022-08-18 DIAGNOSIS — Z7189 Other specified counseling: Secondary | ICD-10-CM | POA: Diagnosis not present

## 2022-08-18 DIAGNOSIS — K7581 Nonalcoholic steatohepatitis (NASH): Secondary | ICD-10-CM | POA: Diagnosis not present

## 2022-08-18 DIAGNOSIS — N170 Acute kidney failure with tubular necrosis: Secondary | ICD-10-CM

## 2022-08-18 DIAGNOSIS — L03116 Cellulitis of left lower limb: Secondary | ICD-10-CM

## 2022-08-18 DIAGNOSIS — K7469 Other cirrhosis of liver: Secondary | ICD-10-CM | POA: Diagnosis not present

## 2022-08-18 LAB — GLUCOSE, CAPILLARY
Glucose-Capillary: 84 mg/dL (ref 70–99)
Glucose-Capillary: 94 mg/dL (ref 70–99)
Glucose-Capillary: 82 mg/dL (ref 70–99)
Glucose-Capillary: 102 mg/dL — ABNORMAL HIGH (ref 70–99)
Glucose-Capillary: 98 mg/dL (ref 70–99)

## 2022-08-18 MED ORDER — HYDROXYZINE HCL 25 MG PO TABS
25.0000 mg | ORAL_TABLET | Freq: Three times a day (TID) | ORAL | Status: DC | PRN
  Administered 2022-08-18 (×2): 25 mg via ORAL
  Filled 2022-08-18 (×2): qty 1

## 2022-08-18 MED ORDER — INSULIN ASPART 100 UNIT/ML IJ SOLN: 0.0000 [IU] | Freq: Every day | INTRAMUSCULAR | Status: DC

## 2022-08-18 MED ORDER — AQUAPHOR EX OINT
TOPICAL_OINTMENT | CUTANEOUS | Status: DC | PRN
  Filled 2022-08-18: qty 50

## 2022-08-18 MED ORDER — HEPATITIS A VACCINE 1440 EL U/ML IM SUSP
1.0000 mL | Freq: Once | INTRAMUSCULAR | Status: AC
  Administered 2022-08-19: 1440 [IU] via INTRAMUSCULAR
  Filled 2022-08-18: qty 1

## 2022-08-18 MED ORDER — KATE FARMS STANDARD 1.4 PO LIQD
325.0000 mL | Freq: Every day | ORAL | Status: DC
  Administered 2022-08-18 – 2022-09-09 (×16): 325 mL via ORAL
  Filled 2022-08-18 (×24): qty 325

## 2022-08-18 MED ORDER — LEVOTHYROXINE SODIUM 150 MCG PO TABS
150.0000 ug | ORAL_TABLET | Freq: Every day | ORAL | Status: DC
  Administered 2022-08-19 – 2022-09-09 (×22): 150 ug via ORAL
  Filled 2022-08-18 (×15): qty 1
  Filled 2022-08-18: qty 2
  Filled 2022-08-18 (×6): qty 1

## 2022-08-18 MED ORDER — ZINC SULFATE 220 (50 ZN) MG PO CAPS
220.0000 mg | ORAL_CAPSULE | Freq: Every day | ORAL | Status: DC
  Administered 2022-08-18 – 2022-09-06 (×19): 220 mg via ORAL
  Filled 2022-08-18 (×19): qty 1

## 2022-08-18 MED ORDER — MIDODRINE HCL 5 MG PO TABS: 10.0000 mg | ORAL_TABLET | Freq: Three times a day (TID) | ORAL | Status: DC

## 2022-08-18 MED ORDER — OXYBUTYNIN CHLORIDE 5 MG PO TABS
2.5000 mg | ORAL_TABLET | Freq: Two times a day (BID) | ORAL | Status: DC
  Administered 2022-08-18 (×2): 2.5 mg via ORAL
  Filled 2022-08-18 (×2): qty 1

## 2022-08-18 MED ORDER — HEPATITIS B VAC RECOMB ADJ 20 MCG/0.5ML IM SOSY
0.5000 mL | PREFILLED_SYRINGE | Freq: Once | INTRAMUSCULAR | Status: AC
  Administered 2022-08-19: 0.5 mL via INTRAMUSCULAR
  Filled 2022-08-18: qty 0.5

## 2022-08-18 MED ORDER — PHENAZOPYRIDINE HCL 100 MG PO TABS
100.0000 mg | ORAL_TABLET | Freq: Three times a day (TID) | ORAL | Status: DC
  Administered 2022-08-18: 100 mg via ORAL
  Filled 2022-08-18: qty 1

## 2022-08-18 MED ORDER — HYDROCODONE-ACETAMINOPHEN 5-325 MG PO TABS
1.0000 | ORAL_TABLET | Freq: Four times a day (QID) | ORAL | Status: DC | PRN
  Administered 2022-08-18 – 2022-08-31 (×29): 1 via ORAL
  Filled 2022-08-18 (×32): qty 1

## 2022-08-18 MED ORDER — HEPARIN SODIUM (PORCINE) 5000 UNIT/ML IJ SOLN
5000.0000 [IU] | Freq: Three times a day (TID) | INTRAMUSCULAR | Status: DC
  Administered 2022-08-18 – 2022-08-22 (×12): 5000 [IU] via SUBCUTANEOUS
  Filled 2022-08-18 (×12): qty 1

## 2022-08-18 MED ORDER — POTASSIUM CHLORIDE CRYS ER 20 MEQ PO TBCR
40.0000 meq | EXTENDED_RELEASE_TABLET | Freq: Once | ORAL | Status: AC
  Administered 2022-08-18: 40 meq via ORAL
  Filled 2022-08-18: qty 2

## 2022-08-18 MED ORDER — PHENAZOPYRIDINE HCL 100 MG PO TABS
100.0000 mg | ORAL_TABLET | Freq: Three times a day (TID) | ORAL | Status: DC
  Filled 2022-08-18: qty 1

## 2022-08-18 MED ORDER — INSULIN ASPART 100 UNIT/ML IJ SOLN: 0.0000 [IU] | Freq: Three times a day (TID) | INTRAMUSCULAR | Status: DC

## 2022-08-18 MED ORDER — HYDROXYZINE HCL 25 MG PO TABS
50.0000 mg | ORAL_TABLET | Freq: Three times a day (TID) | ORAL | Status: DC | PRN
  Administered 2022-08-18 – 2022-08-31 (×6): 50 mg via ORAL
  Filled 2022-08-18 (×7): qty 2

## 2022-08-18 NOTE — Progress Notes (Addendum)
NAME:  [name], MRN:  [id number], DOB:  1955/10/06, LOS: 7 ADMISSION DATE:  08/11/2022, CONSULTATION DATE:  08/11/22 REFERRING MD:  Kara Sis, PA, CHIEF COMPLAINT:  fatigue/ confusion   History of Present Illness:   67 year old female with PMH as below significant for NASH cirrhosis, HE, morbid obesity, chronic diarrhea, and MDR UTI (e. Coli and Klebsiella) who presents from home with complaints of left leg pain, fatigue, mild confusion, and recent N/V.   Patient lives with her son and cared for by her son, currently at bedside.  He provides most history.  Reports she has been doing better, starting to ambulate some with walker.  Voices some confusion over her diuretic regimen between what [facility], PCP, and prior hospital discharge instructions were.  Has only been taking lasix 20mg  and spironolactone daily.  Has not needed midodrine.  Reports several bouts of N/V on Tuesday, son suspects related to bad food, with ongoing poor PO intake since.  Developed left leg pain yesterday in which she went to urgent care for diagnosed with cellulitis and placed on keflex. Son reports they have a stray kitten at home who frequently scratches them.  Also, patient has frequent itching/ scratching of skin and sinus drainage with clear sputum for about one month.  Reports patient was able to ambulate to the car this morning with her walker.  No further N/V , changes in stool from her chronic diarrhea, no fever, chills, no concern for bleeding.  Denies urinary symptoms.   In ER, afebrile, SBP 70-90's, and room air sats 90 but since improved to normal on room air. Labs significant for normal WBC, H/H 7.5/ 24.6, plts 96, Na 130, sCr 2.41, BUN 37, bicarb 13, albumin 1.9, protein 5, normal LFTs except t. Bili near baseline 2.2, INR 3.4, lactic 6.1> 4.2, ammonia 24, PCT 16.  UA noted for trace leukocytes with 21-50 WBC, 21-50 squamous cells, many bacteria. CXR without acute process.  Cultures sent, treated with vancomycin,  meropenem, and clindamycin.  Given NS bolus with minimal improvement in SBP, therefore placed on low dose peripheral levophed.  Underwent imaging CT chest/ abd/ pelvis, CT left femur and tibia/ fibula found to have symmetric severe/ diffuse circumferential anasarca without focal fluid collection and noted new indeterminate wedge deformity of T5.  Son reports mental status is improved with improved blood pressure.  PCCM called for admit.    Pertinent  Medical History  NASH cirrhosis, HE, morbid obesity, PE not on AC (2014), IDA, GERD, HTN but on prn midodrine, short gut syndrome s/p right colectomy, end ileostomy, subsequent takedown of end ileostomy, small bowel resection in 2009, s/p cholecystectomy, chronic diarrhea, one episode of PAF during one hospitalization MDR UTI, former smoker  Significant Hospital Events: Including procedures, antibiotic start and stop dates in addition to other pertinent events   8/1 Admit with hypotension, possible sepsis/ cellulitis of left leg, AKI, anasarca 8/3 increasing delirium, blood cultures positive for flavobacterium (same as blood cultures in 04/2022) - ID started cefiderocol IV 8/6 ID s/o, recs for mero to complete 8/17  8/7 weaning precedex off. Starting to have better UOP. Passed swallow study, NGT removed  8/8 incr WBC   Interim History / Subjective:  Overnight WBC bump from 6 to 13  Afebrile  Abx were de-escalated after 8/5 when azithro completed, but has continued on mero   Having some itching this morning   Objective   Blood pressure (!) 115/55, pulse 98, temperature 97.9 F (36.6  C), temperature source Oral, resp. rate 18, height 5\' 2"  (1.575 m), weight 118.4 kg, SpO2 94%. CVP:  [13 mmHg-18 mmHg] 13 mmHg      Intake/Output Summary (Last 24 hours) at 08/18/2022 1610 Last data filed at 08/18/2022 0801 Gross per 24 hour  Intake 821.75 ml  Output 1905 ml  Net -1083.25 ml   Filed Weights   08/16/22 0416 08/17/22 0446 08/18/22 0313   Weight: 132.5 kg 121.8 kg 118.4 kg   Examination:  General: Chronically and acutely ill older adult F  HEENT: NCAT pink mm has some red bumps at back of tongue anicteric sclera  Neuro: AAOx3 following commands, generalized weakness  CV: rr cap refill < 3 sec  PULM: diminished breath sounds symmetrical chest  GI: obese soft + bowel sounds  GU: foley Extremities: pitting edema. No acute joint deformity  Skin: c/d/w   Assessment & Plan:   Acute encephalopathy, multifactorial - improving  Anxiety, sleep disturbance  -AKI, septic shock, delirium P -tx metabolic factors as below -delirium precautions -PRN atarax  -- increasing to 50 TID PRN (home is 25 TID PRN)   Septic shock due to flavobacterium bacteremia, LLE cellulitis and polymicrobial UTI - improving  -s/p 5d azithro to cover possible cat scratch/bartonella  -component of chronic hypotension, on PRN midodrine at home in context of NASH cirrhosis  P: -cont mero, complete 8/17  -following repeat cx (sent 8/2, no growth thus far. 8/1 with flavobacterium)  -midodrine 15mg  TID   Decompensated NASH cirrhosis Coagulopathy, anemia, thrombocytopenia Anasarca Diarrhea GIB  -minimal ascites on imaging - MELD score 32 with 52.6% 3 month mortality.   P -rifaxan -PRN LFTs and coags -BID PPI -holding lactulose-- catalyzes prolific diarrhea which has caused bleeding hemorrhoids this admission.  -follows with [facility]-- needs EGD for variceal screening, was recently referred to hepatology   AKI w uremia NAGMA Borderline hypokalemia - no obstruction or hydronephrosis on CT imaging, likely pre-renal  - baseline ~ sCr 0.8 (May 2024) P:  - continue 3d course of bicarb tabs -trend renal indices, UOP -replace K    Hx HTN HFpEF AS - prior TTE 06/2022> EF 60-65%, G1DD, mod AS P - holding antihypertensives, on midodrine as above   Physical deconditioning Morbid obesity  P -PT/OT    Hypothyroidism -synthroid  Depression - effexor   Thoracic spine, T5 wedge deformity -ortho fu   GOC DNR status -is DNR but would like intubation. We discussed this some 8/7 -- I advised consideration of DNR/I as I do not think [name] would successfully liberate from MV. They are thinking about this  -they are clear that she would not want extended MV, no trach.  -palliative consult placed 8/6, I support this. She has been through a lot -- nearly monthly hospitalizations since Feb.   L/T/D -foley placed 8/4 -- continue for now -PICC placed 8/5 -- if we can get alt IV access will dc   Dispo:  -nearing readiness for status change to stepdown   Best Practice (right click and "Reselect all SmartList Selections" daily)   Diet/type: Regular consistency (see orders) DVT prophylaxis: SCD GI prophylaxis: PPI Lines: Central line Foley:  Yes, and it is still needed Code Status:  DNR. Intubation ok   Patient son updated at bedside 8/7  CCT: n/a   Tessie Fass MSN, AGACNP-BC [facility] Pulmonary/Critical Care Medicine Amion for pager  08/18/2022, 9:07 AM

## 2022-08-18 NOTE — Progress Notes (Addendum)
eLink Physician-Brief Progress Note Patient Name: [name] DOB: 12/08/1955 MRN: [id number]   Date of Service  08/18/2022  HPI/Events of Note  67 year old female who initially presented with not cirrhosis, portal hypertension, ascites and hepatic encephalopathy with chronic diarrhea from short gut he was initially admitted for septic shock with gram-negative bacteremia and persistent acidosis.   Send normal CBGs for the past 24 hours without hyper or hypoglycemia.  eICU Interventions  Discontinue CBG every 4 hours.     2841 - pain related due to bladder spasms with Foley initially placed for I&O management. No clear indication to maintain. D/C. Add oxybutynin for spasms. Hypertension -> reduce dose of midodrine further to 5mg .    Intervention Category Minor Interventions: Routine modifications to care plan (e.g. PRN medications for pain, fever)    08/18/2022, 9:24 PM

## 2022-08-18 NOTE — Progress Notes (Signed)
Nutrition Follow-up  DOCUMENTATION CODES:   Morbid obesity  INTERVENTION:  - Regular diet.  [employer] 1.4 PO once daily, provides 455 kcal and 20 grams protein. - Monitor weight trends.   NUTRITION DIAGNOSIS:   Inadequate oral intake related to acute illness, lethargy/confusion as evidenced by energy intake < or equal to 50% for > or equal to 5 days. *progressing  GOAL:   Patient will meet greater than or equal to 90% of their needs *progressing  MONITOR:   PO intake, Labs, TF tolerance  REASON FOR ASSESSMENT:   Consult Enteral/tube feeding initiation and management  ASSESSMENT:   67 year old female with PMH NASH cirrhosis, HE, morbid obesity, and MDR UTI (e. 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 Found to have multifactorial shock.  8/1: Admit 8/5: NGT placed 8/7: SLP eval-Reg diet; NGT removed   Patient much more alert and oriented as of yesterday. Had SLP eval and advanced to a regular diet with thin liquids. NGT removed overnight due to diet advancement and causing patient discomfort.  Patient working with another provider at time of visit.  Per RN, patient did well at breakfast this AM and ate oatmeal and had some orange juice. Will order nutrition supplements. Son previously reported patient seems to have some issues with dairy/lactose but not consistently. Will trial [employer].    Medications reviewed and include: -  Labs reviewed: Creatinine 1.80   Diet Order:   Diet Order             Diet regular Room service appropriate? Yes; Fluid consistency: Thin  Diet effective now                   EDUCATION NEEDS:  Education needs have been addressed  Skin:  Skin Assessment: Skin Integrity Issues: Skin Integrity Issues:: Other (Comment) Other: Skin tear right buttocks  Last BM:   8/8  Height:  Ht Readings from Last 1 Encounters:  08/11/22 5\' 2"  (1.575 m)   Weight:  Wt Readings from Last 1 Encounters:  08/18/22 118.4 kg   Ideal Body Weight:  50 kg  BMI:  Body mass index is 47.74 kg/m.  Estimated Nutritional Needs:  Kcal:  1750-2000 kcals Protein:  75-100 grams Fluid:  >/= 1.8L    Shelle Iron RD, LDN For contact information, refer to [facility].

## 2022-08-18 NOTE — Plan of Care (Signed)
Patient still having loose stools today and skin is very excoriated.  Have kept barrier cream on and added some pain medication for the discomfort.

## 2022-08-18 NOTE — Plan of Care (Signed)
  Problem: Clinical Measurements: Goal: Ability to maintain clinical measurements within normal limits will improve Outcome: Progressing Goal: Will remain free from infection Outcome: Progressing Goal: Diagnostic test results will improve Outcome: Progressing Goal: Respiratory complications will improve Outcome: Progressing Goal: Cardiovascular complication will be avoided Outcome: Progressing   Problem: Nutrition: Goal: Adequate nutrition will be maintained Outcome: Progressing   Problem: Pain Managment: Goal: General experience of comfort will improve Outcome: Progressing   Problem: Coping: Goal: Level of anxiety will decrease Outcome: Not Progressing   Problem: Elimination: Goal: Will not experience complications related to bowel motility Outcome: Not Progressing

## 2022-08-18 NOTE — Consult Note (Signed)
Consultation Note Date: 08/18/2022   Patient Name: [name]  DOB: 1955-02-19  MRN: [id number]  Age / Sex: 67 y.o., female  PCP: [name], MD Referring Physician: Steffanie Dunn, DO  Reason for Consultation:  goals of care  HPI/Patient Profile: 67 y.o. female  with past medical history of NASH cirrhosis, portal hypertension, ascites, short gut syndrome with chronic diarrhea, pAF, HTN, HLD, hypothyroid, blind in one eye, recurrent cellulits admitted on 08/11/2022 with sepsis d/t cellulitis and flavobacterium bacteremia. Palliative consulted for GOC.   Primary Decision Maker PATIENT - surrogate is her son, Shonnie Northrop  Discussion: Chart reviewed including labs, progress notes, imaging from this and previous encounters.  Met at bedside with Cellestine and [name].  Prior to admission Rickell was living at home with [name]. Able to do some ADL's on her own. Was ambulating some- able to walk herself to the car to come to ED. They have been able to travel and take trips since her last hospitalization.  [name] shared the effort he has put forth to care for his Mom and how he cared for his Dad in a similar fashion before he died.  He shares Brenlynn's health journey and how she has been sicker before than she is now and how he has aided in her recovery, avoiding nursing facilities.  Keoni also endorses her health journey. She and [name] are well-versed in her multiple comorbidities. They understand her liver illness puts her at high risk for morbidity and mortality.  Cerra shares her desire to continue all life prolonging measures for now and [name] is support of this.  She and [name] note that "they will know" when the time has come to let her go and just let her be comfortable.  She has a DNR in place- she wishes for full scope care otherwise including short term intubation. She experienced intubation last year and is  comfortable undergoing it again. She and [name] both understand there is a high risk if she is intubated she may not come off- she would not want a trach or to be kept alive artificially long term. [name] is prepared to make a decision to liberate her from life support if needed.  Nicoleanne is having some bladder spasm, pain, and burning in her pubic area. Nurse notes there is an area of rawness where her foley catheter is placed- we discussed some symptom management for this.      SUMMARY OF RECOMMENDATIONS -DNR- full scope otherwise -Oxybutynin 2.5 po BID until foley catheter is removed -Pyridium tablet 100mg  TID x 3 days -Aquaphor for excoriations around perineal area -Hydrocodone 5mg /325mg  APAP- 1 tab q6 hrs as needed for pain    Code Status/Advance Care Planning: DNR   Prognosis:   Unable to determine  Discharge Planning: Home with Home Health  Primary Diagnoses: Present on Admission:  Hypotension   Review of Systems  Physical Exam  Vital Signs: BP (!) 115/55   Pulse 98   Temp 97.9 F (36.6 C) (Oral)  Resp 18   Ht 5\' 2"  (1.575 m)   Wt 118.4 kg   SpO2 94%   BMI 47.74 kg/m  Pain Scale: 0-10   Pain Score: 3    SpO2: SpO2: 94 % O2 Device:SpO2: 94 % O2 Flow Rate: .   IO: Intake/output summary:  Intake/Output Summary (Last 24 hours) at 08/18/2022 1125 Last data filed at 08/18/2022 0801 Gross per 24 hour  Intake 626.21 ml  Output 1905 ml  Net -1278.79 ml    LBM: Last BM Date : 08/18/22 Baseline Weight: Weight: 106.6 kg Most recent weight: Weight: 118.4 kg       Thank you for this consult. Palliative medicine will continue to follow and assist as needed.  Time Total: 100 minutes Greater than 50%  of this time was spent counseling and coordinating care related to the above assessment and plan.  Signed by: Ocie Bob, AGNP-C Palliative Medicine    Please contact Palliative Medicine Team phone at [phone] for questions and concerns.  For individual provider:  See Loretha Stapler

## 2022-08-19 DIAGNOSIS — I959 Hypotension, unspecified: Secondary | ICD-10-CM | POA: Diagnosis not present

## 2022-08-19 LAB — CBC WITH DIFFERENTIAL/PLATELET
Abs Immature Granulocytes: 0.15 10*3/uL — ABNORMAL HIGH (ref 0.00–0.07)
Basophils Absolute: 0 10*3/uL (ref 0.0–0.1)
Basophils Relative: 0 %
Eosinophils Absolute: 0.5 10*3/uL (ref 0.0–0.5)
Eosinophils Relative: 4 %
HCT: 25 % — ABNORMAL LOW (ref 36.0–46.0)
Hemoglobin: 8.1 g/dL — ABNORMAL LOW (ref 12.0–15.0)
Immature Granulocytes: 1 %
Lymphocytes Relative: 15 %
Lymphs Abs: 1.6 10*3/uL (ref 0.7–4.0)
MCH: 29.9 pg (ref 26.0–34.0)
MCHC: 32.4 g/dL (ref 30.0–36.0)
MCV: 92.3 fL (ref 80.0–100.0)
Monocytes Absolute: 0.8 10*3/uL (ref 0.1–1.0)
Monocytes Relative: 7 %
Neutro Abs: 8 10*3/uL — ABNORMAL HIGH (ref 1.7–7.7)
Neutrophils Relative %: 73 %
Platelets: 94 10*3/uL — ABNORMAL LOW (ref 150–400)
RBC: 2.71 MIL/uL — ABNORMAL LOW (ref 3.87–5.11)
RDW: 16.7 % — ABNORMAL HIGH (ref 11.5–15.5)
WBC: 11 10*3/uL — ABNORMAL HIGH (ref 4.0–10.5)
nRBC: 0 % (ref 0.0–0.2)

## 2022-08-19 LAB — COMPREHENSIVE METABOLIC PANEL
ALT: 27 U/L (ref 0–44)
AST: 32 U/L (ref 15–41)
Albumin: 2.4 g/dL — ABNORMAL LOW (ref 3.5–5.0)
Alkaline Phosphatase: 82 U/L (ref 38–126)
Anion gap: 11 (ref 5–15)
BUN: 67 mg/dL — ABNORMAL HIGH (ref 8–23)
CO2: 20 mmol/L — ABNORMAL LOW (ref 22–32)
Calcium: 8 mg/dL — ABNORMAL LOW (ref 8.9–10.3)
Chloride: 108 mmol/L (ref 98–111)
Creatinine, Ser: 1.31 mg/dL — ABNORMAL HIGH (ref 0.44–1.00)
GFR, Estimated: 45 mL/min — ABNORMAL LOW (ref >60)
Glucose, Bld: 109 mg/dL — ABNORMAL HIGH (ref 70–99)
Potassium: 3.9 mmol/L (ref 3.5–5.1)
Sodium: 139 mmol/L (ref 135–145)
Total Bilirubin: 3.5 mg/dL — ABNORMAL HIGH (ref 0.3–1.2)
Total Protein: 5 g/dL — ABNORMAL LOW (ref 6.5–8.1)

## 2022-08-19 MED ORDER — LACTULOSE 10 GM/15ML PO SOLN
10.0000 g | Freq: Every day | ORAL | Status: DC
  Administered 2022-08-20: 10 g via ORAL
  Filled 2022-08-19 (×2): qty 15

## 2022-08-19 MED ORDER — OXYBUTYNIN CHLORIDE 5 MG PO TABS
5.0000 mg | ORAL_TABLET | Freq: Three times a day (TID) | ORAL | Status: DC
  Administered 2022-08-19 (×4): 5 mg via ORAL
  Filled 2022-08-19 (×5): qty 1

## 2022-08-19 MED ORDER — VENLAFAXINE HCL ER 75 MG PO CP24
75.0000 mg | ORAL_CAPSULE | Freq: Every day | ORAL | Status: DC
  Administered 2022-08-19 – 2022-09-09 (×21): 75 mg via ORAL
  Filled 2022-08-19 (×22): qty 1

## 2022-08-19 MED ORDER — PANTOPRAZOLE SODIUM 40 MG PO TBEC
40.0000 mg | DELAYED_RELEASE_TABLET | Freq: Two times a day (BID) | ORAL | Status: DC
  Administered 2022-08-20 – 2022-09-09 (×40): 40 mg via ORAL
  Filled 2022-08-19 (×41): qty 1

## 2022-08-19 MED ORDER — MIDODRINE HCL 5 MG PO TABS
5.0000 mg | ORAL_TABLET | Freq: Three times a day (TID) | ORAL | Status: DC
  Administered 2022-08-19 – 2022-08-20 (×3): 5 mg via ORAL
  Filled 2022-08-19 (×4): qty 1

## 2022-08-19 MED ORDER — FAMOTIDINE 20 MG PO TABS
20.0000 mg | ORAL_TABLET | Freq: Every day | ORAL | Status: DC
  Administered 2022-08-20 – 2022-09-09 (×20): 20 mg via ORAL
  Filled 2022-08-19 (×20): qty 1

## 2022-08-19 MED ORDER — PROCHLORPERAZINE EDISYLATE 10 MG/2ML IJ SOLN
10.0000 mg | Freq: Four times a day (QID) | INTRAMUSCULAR | Status: DC | PRN
  Administered 2022-08-22 – 2022-08-29 (×6): 10 mg via INTRAVENOUS
  Filled 2022-08-19 (×6): qty 2

## 2022-08-19 MED ORDER — NYSTATIN 100000 UNIT/GM EX CREA
TOPICAL_CREAM | Freq: Two times a day (BID) | CUTANEOUS | Status: DC
  Administered 2022-08-20 – 2022-08-23 (×2): 1 via TOPICAL
  Filled 2022-08-19 (×4): qty 30

## 2022-08-19 MED ORDER — ORAL CARE MOUTH RINSE: 15.0000 mL | OROMUCOSAL | Status: DC | PRN

## 2022-08-19 NOTE — Progress Notes (Signed)
[facility] Progress Note Patient: Keysa Troop OMV:[id number] DOB: 09-30-1955 DOA: 08/11/2022  DOS: the patient was seen and examined on 08/19/2022  Brief hospital course: Ms. Marcello is a 67 y/o woman with a history of cirhrosis who presented with septic shock due to cellulitis with Flavobacterium.  Initially with the ICU.  Treated with pressors. Now on midodrine.  ID was consulted as well.  As palliative care.  Assessment and Plan: Acute metabolic encephalopathy Mentation improving. No instructions at the time of my evaluation Continue current regimen.   Septic shock due to flavobacterium bacteremia, LLE cellulitis and polymicrobial UTI - improving  Treated with azithromycin. On midodrine. Treated with IV pressors. Will be on meropenem.  Last dose 8/17.   Decompensated NASH cirrhosis Coagulopathy, anemia, thrombocytopenia Anasarca Diarrhea GIB  Ascites.  On rifaximin as well as lactulose. Outpatient GI and hepatology follow-up.  AKI w uremia NAGMA Borderline hypokalemia - no obstruction or hydronephrosis on CT imaging, likely pre-renal    Hx HTN HFpEF AS prior TTE 06/2022> EF 60-65%, G1DD, mod AS Monitor for now.  Currently diuresis on hold.   Physical deconditioning Morbid obesity  PT OT been consulted.  Monitor.   Hypothyroidism -synthroid   Depression - effexor    Thoracic spine, T5 wedge deformity -ortho fu    GOC DNR status    Subjective: Feeling anxious.  No nausea no vomiting no fever no chills.  Physical Exam: General: in Mild distress, No Rash, multiple lesions on the face and chest area which family reports is likely secondary to her scratching. Cardiovascular: S1 and S2 Present, No Murmur Respiratory: Good respiratory effort, Bilateral Air entry present. No Crackles, No wheezes Abdomen: Bowel Sound present, No tenderness Extremities: Trace edema Neuro: Alert and oriented x3, no new focal deficit  Data Reviewed: I have Reviewed nursing  notes, Vitals, and Lab results. Since last encounter, pertinent lab results CBC and BMP   . I have ordered test including CBC and BMP  .   Disposition: Status is: Inpatient Remains inpatient appropriate because: Requiring antibiotics and further stability  heparin injection 5,000 Units Start: 08/18/22 1500 SCDs Start: 08/11/22 1108   Family Communication: Family at bedside Level of care: Telemetry transfer from stepdown to telemetry Vitals:   08/19/22 1200 08/19/22 1326 08/19/22 1400 08/19/22 1500  BP: (!) 125/57  (!) 139/58   Pulse: 92  88 76  Resp: 16  (!) 23 16  Temp:  98 F (36.7 C)    TempSrc:  Oral    SpO2: 95%  94% 95%  Weight:      Height:         Author: Lynden Oxford, MD 08/19/2022 7:51 PM  Please look on [email or web address] to find out who is on call.

## 2022-08-19 NOTE — Plan of Care (Signed)

## 2022-08-19 NOTE — Evaluation (Signed)
Occupational Therapy Evaluation Patient Details Name: [name] MRN: [id number] DOB: 1955/07/29 Today's Date: 08/19/2022   History of Present Illness Pt is 67 yo female admitted on 06/11/1955 with septic shock due to L LE cellulitis and UTI.  Pt with hx including but not limited to NASH cirrhosis, HTN, PE, short gut syndrome, morbid obesity, IDA, pAF, HTN, HLD   Clinical Impression   PTA pt living at home with supportive son who assisted with ADL and IADL as needed. Pt had apparently improved since her last hospitalization to walking with a RW and was participating in mobility in the community as well. PT states her son assisted with ADL as needed. Session limited by multiple episodes on incontinence. Required Max A +2 for bed mobility to sit EOB and requires overall max to total A with LB ADL @ bed level. Unable to progress OOB at this time. Feel pt would benefit from Interdry under pannus area - nsg made aware. Patient will benefit from continued inpatient follow up therapy, <3 hours/day due to deficits listed below. Acute OT to follow. VSS on RA.       If plan is discharge home, recommend the following: A lot of help with walking and/or transfers;A lot of help with bathing/dressing/bathroom;Assistance with cooking/housework;Direct supervision/assist for medications management;Direct supervision/assist for financial management;Assist for transportation;Help with stairs or ramp for entrance    Functional Status Assessment  Patient has had a recent decline in their functional status and demonstrates the ability to make significant improvements in function in a reasonable and predictable amount of time.  Equipment Recommendations  None recommended by OT    Recommendations for Other Services       Precautions / Restrictions Precautions Precautions: Fall Precaution Comments: peri-area excoriated - incontinent and frequent loose stools Restrictions Weight Bearing Restrictions: No       Mobility Bed Mobility Overal bed mobility: Needs Assistance Bed Mobility: Rolling, Supine to Sit, Sit to Supine Rolling: Mod assist, +2 for safety/equipment   Supine to sit: +2 for safety/equipment, Max assist Sit to supine: +2 for physical assistance, Max assist   General bed mobility comments: Multiple rolls for cleaning pre    Transfers Overall transfer level: Needs assistance Equipment used: Rolling walker (2 wheels) Transfers: Sit to/from Stand Sit to Stand: Mod assist, +2 safety/equipment           General transfer comment: Sit to stand x 2 with mod A of 2 for safety.  Son present and highly encouraged pt for transfer to chair but pt fatigued.  Discussed good progress today just getting to EOB and standing considering 8 days in bed. Additionally, could benefit from geomat for pressure relief in chair (notified RN).      Balance Overall balance assessment: Needs assistance Sitting-balance support: Bilateral upper extremity supported Sitting balance-Leahy Scale: Poor Sitting balance - Comments: EOB with supervision but bil UE support.  Sat EOB >10 mins                                   ADL either performed or assessed with clinical judgement   ADL Overall ADL's : Needs assistance/impaired Eating/Feeding: Set up   Grooming: Set up;Supervision/safety;Sitting   Upper Body Bathing: Set up;Supervision/ safety;Sitting;Bed level   Lower Body Bathing: Maximal assistance;Bed level   Upper Body Dressing : Minimal assistance;Bed level   Lower Body Dressing: Total assistance;Bed level       Toileting- Clothing Manipulation  and Hygiene: Total assistance (incontinent) Toileting - Clothing Manipulation Details (indicate cue type and reason): incontinenet of BM adn urine multiple times throughout session     Functional mobility during ADLs: Maximal assistance;+2 for physical assistance       Vision Baseline Vision/History: 2 Legally blind (L eye)        Perception         Praxis         Pertinent Vitals/Pain Pain Assessment Pain Assessment: Faces Faces Pain Scale: Hurts whole lot Breathing: occasional labored breathing, short period of hyperventilation Negative Vocalization: none Facial Expression: sad, frightened, frown Body Language: tense, distressed pacing, fidgeting Consolability: unable to console, distract or reassure PAINAD Score: 5 Pain Location: peri-area with cleaning , L LE with mobility Pain Descriptors / Indicators: Moaning, Grimacing Pain Intervention(s): Limited activity within patient's tolerance     Extremity/Trunk Assessment Upper Extremity Assessment Upper Extremity Assessment: Generalized weakness RUE Deficits / Details: Edema throughout but ROM grossly WFL, demonstrates at least 3/5 strength throughout LUE Deficits / Details: Edema throughout but ROM grossly WFL, demonstrates at least 3/5 strength throughout   Lower Extremity Assessment Lower Extremity Assessment: Defer to PT evaluation LLE Deficits / Details:  (painful)   Cervical / Trunk Assessment Cervical / Trunk Assessment: Other exceptions (increased body habitus)   Communication     Cognition Arousal: Alert Behavior During Therapy: Anxious Overall Cognitive Status: No family/caregiver present to determine baseline cognitive functioning                                 General Comments: will further assess     General Comments  exoriated peri area; MASD under pannus    Exercises Exercises: General Upper Extremity General Exercises - Upper Extremity Shoulder Flexion: AROM, Both, 10 reps Shoulder Extension: AROM, Both, 10 reps Shoulder ABduction: AROM, Both, 10 reps Elbow Flexion: AROM, Both, 10 reps Elbow Extension: AROM, Both, 10 reps   Shoulder Instructions      Home Living Family/patient expects to be discharged to:: Private residence Living Arrangements: Children Available Help at Discharge: Family;Available  24 hours/day Type of Home: House Home Access: Ramped entrance     Home Layout: One level     Bathroom Shower/Tub: Walk-in shower;Sponge bathes at baseline   [employer]: Standard     Home Equipment: Rollator (4 wheels);Rolling Walker (2 wheels);BSC/3in1;Other (comment);Hospital bed;Wheelchair - manual Water quality scientist)   Additional Comments: Pt lives with son [name] - very supportive, provides assist as needed, takes pt out in community, provides mental challenges to pt to keep her sharp and monitor status - per PT note      Prior Functioning/Environment Prior Level of Function : Independent/Modified Independent             Mobility Comments: Pt with recent hospitalizations but had returned to walking in home with rollator or RW and w/c for longer community distance.  Son reports he has taken her out in community several times since last admission. ADLs Comments: Son has been assisting with ADLs lately.Pt states she was doing her own ADL tasks however unsure of accuracy given level of assistance adn body habitus        OT Problem List: Decreased strength;Decreased range of motion;Decreased activity tolerance;Impaired balance (sitting and/or standing);Decreased safety awareness;Decreased knowledge of use of DME or AE;Cardiopulmonary status limiting activity;Obesity;Impaired UE functional use;Pain;Increased edema      OT Treatment/Interventions: Self-care/ADL training    OT Goals(Current  goals can be found in the care plan section) Acute Rehab OT Goals Patient Stated Goal: to get stronger OT Goal Formulation: With patient Time For Goal Achievement: 09/02/22 Potential to Achieve Goals: Good  OT Frequency: Min 1X/week    Co-evaluation              AM-PAC OT "6 Clicks" Daily Activity     Outcome Measure Help from another person eating meals?: A Little Help from another person taking care of personal grooming?: A Little Help from another person toileting, which includes using  toliet, bedpan, or urinal?: Total Help from another person bathing (including washing, rinsing, drying)?: A Lot Help from another person to put on and taking off regular upper body clothing?: A Lot Help from another person to put on and taking off regular lower body clothing?: Total 6 Click Score: 12   End of Session Nurse Communication: Mobility status;Need for lift equipment  Activity Tolerance: Patient limited by pain;Other (comment) (limited by multiple indidents of incontinence) Patient left: in bed;with call bell/phone within reach  OT Visit Diagnosis: Unsteadiness on feet (R26.81);Other abnormalities of gait and mobility (R26.89);Muscle weakness (generalized) (M62.81);Pain Pain - part of body:  (periarea; LLE)                Time: 0932-3557 OT Time Calculation (min): 29 min Charges:  OT General Charges $OT Visit: 1 Visit OT Evaluation $OT Eval Moderate Complexity: 1 Mod OT Treatments $Self Care/Home Management : 8-22 mins  Luisa Dago, OT/L   Acute OT Clinical Specialist Acute Rehabilitation Services Pager [phone] Office [phone]   [facility] 08/19/2022, 7:16 PM

## 2022-08-19 NOTE — Evaluation (Signed)
Physical Therapy Evaluation Patient Details Name: [name] MRN: [id number] DOB: 1955-04-16 Today's Date: 08/19/2022  History of Present Illness  Pt is 67 yo female admitted on 07/01/1955 with septic shock due to L LE cellulitis and UTI.  Pt with hx including but not limited to NASH cirrhosis, HTN, PE, short gut syndrome, morbid obesity, IDA, pAF, HTN, HLD  Clinical Impression  Pt admitted with above diagnosis. At baseline, pt resides with her son and could ambulate in home and short community distances with RW.  They have all necessary DME and son supportive and able to provide assist.  Today, pt requiring mod A of 2 for safety rolling, transfer to EOB, and to stand.  Her VSS throughout.  Pt fatigued and reports buttock sore so requested return to bed - PT agrees considering first time up 8 days, incontinent, peri-area excoriated - sitting in chair may be difficult -recommend Geo-mat for pressure relief when in chair. Pt currently with functional limitations due to the deficits listed below (see PT Problem List). Pt will benefit from acute skilled PT to increase their independence and safety with mobility to allow discharge.  At discharge Patient will benefit from continued inpatient follow up therapy, <3 hours/day          If plan is discharge home, recommend the following: A lot of help with bathing/dressing/bathroom;A lot of help with walking and/or transfers;Assistance with cooking/housework;Help with stairs or ramp for entrance   Can travel by private vehicle   No    Equipment Recommendations None recommended by PT  Recommendations for Other Services       Functional Status Assessment Patient has had a recent decline in their functional status and demonstrates the ability to make significant improvements in function in a reasonable and predictable amount of time.     Precautions / Restrictions Precautions Precautions: Fall Precaution Comments: peri-area excoriated - incontinent and  frequent loose stools      Mobility  Bed Mobility Overal bed mobility: Needs Assistance Bed Mobility: Rolling, Supine to Sit, Sit to Supine Rolling: Mod assist, +2 for safety/equipment   Supine to sit: Mod assist, +2 for safety/equipment Sit to supine: Mod assist, +2 for physical assistance   General bed mobility comments: Multiple rolls for cleaning pre and post transfers -requiring assist for legs and trunk.  Supine/sit: requiring mod A to lift trunk and scoot forward.  Return to supine with assit for legs and to control trunk    Transfers Overall transfer level: Needs assistance Equipment used: Rolling walker (2 wheels) Transfers: Sit to/from Stand Sit to Stand: Mod assist, +2 safety/equipment           General transfer comment: Sit to stand x 2 with mod A of 2 for safety.  Son present and highly encouraged pt for transfer to chair but pt fatigued.  Discussed good progress today just getting to EOB and standing considering 8 days in bed. Additionally, could benefit from geomat for pressure relief in chair (notified RN).    Ambulation/Gait Ambulation/Gait assistance: Mod assist, +2 safety/equipment Gait Distance (Feet): 1 Feet Assistive device: Rolling walker (2 wheels) Gait Pattern/deviations: Step-to pattern, Decreased stride length       General Gait Details: Pt took 3 small side steps toward [facility]  Stairs            Wheelchair Mobility     Tilt Bed    Modified Rankin (Stroke Patients Only)       Balance Overall balance assessment: Needs assistance Sitting-balance  support: Bilateral upper extremity supported Sitting balance-Leahy Scale: Poor Sitting balance - Comments: EOB with supervision but bil UE support.  Sat EOB >10 mins   Standing balance support: Bilateral upper extremity supported Standing balance-Leahy Scale: Poor Standing balance comment: RW and min A static stand                             Pertinent Vitals/Pain Pain  Assessment Pain Assessment: Faces Pain Score: 8  Pain Location: peri-area with cleaning , L LE with mobility Pain Descriptors / Indicators: Moaning, Grimacing Pain Intervention(s): Limited activity within patient's tolerance, Monitored during session    Home Living Family/patient expects to be discharged to:: Private residence Living Arrangements: Children Available Help at Discharge: Family;Available 24 hours/day Type of Home: House Home Access: Ramped entrance       Home Layout: One level Home Equipment: Rollator (4 wheels);Rolling Walker (2 wheels);BSC/3in1;Other (comment);Hospital bed;Wheelchair - manual Water quality scientist) Additional Comments: Pt lives with son [name] - very supportive, provides assist as needed, takes pt out in community, provides mental challenges to pt to keep her sharp and monitor status    Prior Function Prior Level of Function : Independent/Modified Independent             Mobility Comments: Pt with recent hospitalizations but had returned to walking in home with rollator or RW and w/c for longer community distance.  Son reports he has taken her out in community several times since last admission. ADLs Comments: Son has been assisting with ADLs lately.     Extremity/Trunk Assessment   Upper Extremity Assessment Upper Extremity Assessment: RUE deficits/detail;LUE deficits/detail RUE Deficits / Details: Edema throughout but ROM grossly WFL, demonstrates at least 3/5 strength throughout LUE Deficits / Details: Edema throughout but ROM grossly WFL, demonstrates at least 3/5 strength throughout    Lower Extremity Assessment Lower Extremity Assessment: LLE deficits/detail;RLE deficits/detail RLE Deficits / Details: Edema throughout, ROM grossly WFL (some limitiations due to body habitus), MMT: ankle 3/5, knee 3/5, hip 2/5 LLE Deficits / Details: Edema throughout, ROM grossly WFL (some limitiations due to body habitus), MMT: ankle 3/5, knee 3/5, hip 2/5; erythema  upper thigh-admitted with cellulitis    Cervical / Trunk Assessment Cervical / Trunk Assessment: Normal  Communication      Cognition Arousal: Alert Behavior During Therapy: Anxious Overall Cognitive Status: Within Functional Limits for tasks assessed                                 General Comments: Mildly anxious - son reports just started back on her meds        General Comments General comments (skin integrity, edema, etc.): Peri-area excoriated - pt with frequent loose stools and incontinent urine  All VSS on RA and pt with no dizziness/lightheadedness upon sitting.     Exercises General Exercises - Lower Extremity Ankle Circles/Pumps: AROM, Both, 10 reps, Supine Long Arc Quad: AROM, Both, 10 reps, Seated   Assessment/Plan    PT Assessment Patient needs continued PT services  PT Problem List Decreased strength;Decreased coordination;Pain;Decreased range of motion;Decreased activity tolerance;Decreased balance;Decreased mobility;Decreased knowledge of precautions;Decreased knowledge of use of DME;Cardiopulmonary status limiting activity       PT Treatment Interventions DME instruction;Therapeutic exercise;Gait training;Balance training;Stair training;Functional mobility training;Therapeutic activities;Patient/family education;Modalities;Neuromuscular re-education    PT Goals (Current goals can be found in the Care Plan section)  Acute Rehab PT  Goals Patient Stated Goal: To Walk; To decrease pain PT Goal Formulation: With patient/family Time For Goal Achievement: 09/02/22 Potential to Achieve Goals: Good    Frequency Min 1X/week     Co-evaluation               AM-PAC PT "6 Clicks" Mobility  Outcome Measure Help needed turning from your back to your side while in a flat bed without using bedrails?: A Lot Help needed moving from lying on your back to sitting on the side of a flat bed without using bedrails?: A Lot Help needed moving to and from  a bed to a chair (including a wheelchair)?: Total Help needed standing up from a chair using your arms (e.g., wheelchair or bedside chair)?: Total Help needed to walk in hospital room?: Total Help needed climbing 3-5 steps with a railing? : Total 6 Click Score: 8    End of Session Equipment Utilized During Treatment: Gait belt Activity Tolerance: Patient tolerated treatment well Patient left: in bed;with call bell/phone within reach;with bed alarm set Nurse Communication: Mobility status PT Visit Diagnosis: Other abnormalities of gait and mobility (R26.89);Muscle weakness (generalized) (M62.81)    Time: 1610-9604 PT Time Calculation (min) (ACUTE ONLY): 43 min   Charges:   PT Evaluation $PT Eval Moderate Complexity: 1 Mod PT Treatments $Therapeutic Activity: 23-37 mins PT General Charges $$ ACUTE PT VISIT: 1 Visit         Anise Salvo, PT Acute Rehab [facility] [phone]   Rayetta Humphrey 08/19/2022, 1:46 PM

## 2022-08-19 NOTE — Hospital Course (Addendum)
67 year old female with PMH HTN, NASH cirrhosis, HE, morbid obesity, chronic diarrhea, and MDR UTI (e. Coli and Klebsiella) who presented from home with complaints of left leg pain, fatigue, mild confusion, and recent N/V.    Patient lives with her son and cared for by her son.    She was admitted with septic shock due to cellulitis with Flavobacterium bacteremia as well. Initially treated in the ICU.  Treated with pressors.  Transition to midodrine. ID was consulted on 8/4 for flavobacterium bacteremia. Palliative care was consulted on 8/8 for goals of care currently DNR. Started to have vaginal bleeding on 8/12, now resolved. She had MRI of her spine showing acute fractures of T3 and T5 vertebral bodies, moderate height loss at T3.  Mild to moderate height loss at T5. IR consulted for kyphoplasty, but unable to perform due to body habitus.

## 2022-08-20 DIAGNOSIS — I959 Hypotension, unspecified: Secondary | ICD-10-CM | POA: Diagnosis not present

## 2022-08-20 MED ORDER — TAMSULOSIN HCL 0.4 MG PO CAPS
0.4000 mg | ORAL_CAPSULE | Freq: Every day | ORAL | Status: DC
  Administered 2022-08-20 – 2022-09-09 (×20): 0.4 mg via ORAL
  Filled 2022-08-20 (×20): qty 1

## 2022-08-20 MED ORDER — LACTULOSE 10 GM/15ML PO SOLN: 10.0000 g | Freq: Every day | ORAL | Status: DC | PRN

## 2022-08-20 MED ORDER — MIDODRINE HCL 5 MG PO TABS
10.0000 mg | ORAL_TABLET | Freq: Three times a day (TID) | ORAL | Status: DC
  Administered 2022-08-20 – 2022-09-09 (×57): 10 mg via ORAL
  Filled 2022-08-20 (×57): qty 2

## 2022-08-20 MED ORDER — SODIUM CHLORIDE 0.9 % IV SOLN
1.0000 g | Freq: Three times a day (TID) | INTRAVENOUS | Status: DC
  Administered 2022-08-20 – 2022-08-26 (×19): 1 g via INTRAVENOUS
  Filled 2022-08-20 (×19): qty 20

## 2022-08-20 MED ORDER — KETOTIFEN FUMARATE 0.035 % OP SOLN
1.0000 [drp] | Freq: Two times a day (BID) | OPHTHALMIC | Status: DC
  Administered 2022-08-20 – 2022-09-09 (×40): 1 [drp] via OPHTHALMIC
  Filled 2022-08-20: qty 5

## 2022-08-20 MED ORDER — MAGIC MOUTHWASH
5.0000 mL | Freq: Four times a day (QID) | ORAL | Status: AC | PRN
  Administered 2022-08-21: 5 mL via ORAL
  Filled 2022-08-20 (×2): qty 5

## 2022-08-20 MED ORDER — METHOCARBAMOL 1000 MG/10ML IJ SOLN
500.0000 mg | Freq: Once | INTRAVENOUS | Status: AC
  Administered 2022-08-20: 500 mg via INTRAVENOUS
  Filled 2022-08-20: qty 500

## 2022-08-20 NOTE — Progress Notes (Signed)
[facility] Progress Note Patient: [name] ZOX:[id number] DOB: 12/10/1955 DOA: 08/11/2022  DOS: the patient was seen and examined on 08/20/2022  Brief hospital course: Ms. Buerger is a 67 y/o woman with a history of cirhrosis who presented with septic shock due to cellulitis with Flavobacterium.  Initially with the ICU.  Treated with pressors. Now on midodrine.  ID was consulted as well.  As palliative care.  Assessment and Plan: Acute metabolic encephalopathy Mentation improving.  Most likely in the setting of sepsis. No asterixis at the time of my evaluation Continue current regimen with rifaximin.   Septic shock due to flavobacterium bacteremia, LLE cellulitis and polymicrobial UTI - improving  Treated with azithromycin. On midodrine. Treated with IV pressors. Will be on meropenem.  Last dose 8/17.   Decompensated NASH cirrhosis Coagulopathy, anemia, thrombocytopenia Anasarca Diarrhea GIB  Ascites.   On rifaximin as well as lactulose. Outpatient GI and hepatology follow-up.  AKI w uremia NAGMA Borderline hypokalemia - no obstruction or hydronephrosis on CT imaging, likely pre-renal    Hx HTN HFpEF AS prior TTE 06/2022> EF 60-65%, G1DD, mod AS Monitor for now.  Currently diuresis on hold.   Physical deconditioning Morbid obesity  PT OT been consulted.  Monitor.   Hypothyroidism -synthroid   Depression - effexor    Thoracic spine, T5 wedge deformity -ortho fu   Headache. No focal deficits. Continue Tylenol.  Chronic right eye pain on movement towards medial side. Will attempt eyedrops. Currently I do not appreciate any conjunctival injection, or any other acute abnormality to concern further workup.  Chronic left ear pain. Outpatient follow-up with ENT recommended.   GOC DNR status    Subjective: No nausea or vomiting.  Blood pressure improving.  Will further titrate midodrine.  Reports multiple pains.  Appears to be improving and  stable.  Physical Exam: General: in Mild distress, diffuse lesions, no significant rash Cardiovascular: S1 and S2 Present, No Murmur Respiratory: Good respiratory effort, Bilateral Air entry present. No Crackles, No wheezes Abdomen: Bowel Sound present, No tenderness Extremities: No edema Neuro: Alert and oriented x3, no new focal deficit Extraocular muscle movement painful to hours medial side on further right eye.  No nystagmus.  No asterixis.  Data Reviewed: I have Reviewed nursing notes, Vitals, and Lab results. Since last encounter, pertinent lab results CBC and CMP   . I have ordered test including CBC and CMP  .   Disposition: Status is: Inpatient Remains inpatient appropriate because: Awaiting improvement in blood pressure  heparin injection 5,000 Units Start: 08/18/22 1500 SCDs Start: 08/11/22 1108   Family Communication: Family at bedside Level of care: Telemetry   Vitals:   08/19/22 2335 08/20/22 0400 08/20/22 0924 08/20/22 1401  BP: 118/60 130/63 129/63 (!) 103/42  Pulse: 88 81 79 81  Resp:  18 16 18   Temp:  98.2 F (36.8 C) 98.8 F (37.1 C) 98.6 F (37 C)  TempSrc:  Oral Oral Oral  SpO2: 99% 99% 98% 100%  Weight:      Height:         Author: Lynden Oxford, MD 08/20/2022 5:59 PM  Please look on [email or web address] to find out who is on call.

## 2022-08-20 NOTE — Progress Notes (Signed)
PHARMACY CONSULT NOTE FOR:  OUTPATIENT  PARENTERAL ANTIBIOTIC THERAPY (OPAT)--> update  Indication: Fusobacterium/myroides bacteremia Regimen: Meropenem 1g IV Q8H End date: 08/27/2022  IV antibiotic discharge orders are pended. To discharging provider:  please sign these orders via discharge navigator,  Select New Orders & click on the button choice - Manage This Unsigned Work.     Thank you for allowing pharmacy to be a part of this patient's care.  Dorna Leitz P 08/20/2022, 11:02 AM

## 2022-08-20 NOTE — Progress Notes (Addendum)
Post void residual from bladder scan 266 cc. The patient has been encouraged to void. Education provided.

## 2022-08-21 DIAGNOSIS — I959 Hypotension, unspecified: Secondary | ICD-10-CM | POA: Diagnosis not present

## 2022-08-21 LAB — CBC
HCT: 24.5 % — ABNORMAL LOW (ref 36.0–46.0)
Hemoglobin: 7.6 g/dL — ABNORMAL LOW (ref 12.0–15.0)
MCH: 30.5 pg (ref 26.0–34.0)
MCHC: 31 g/dL (ref 30.0–36.0)
MCV: 98.4 fL (ref 80.0–100.0)
Platelets: 83 10*3/uL — ABNORMAL LOW (ref 150–400)
RBC: 2.49 MIL/uL — ABNORMAL LOW (ref 3.87–5.11)
RDW: 17.7 % — ABNORMAL HIGH (ref 11.5–15.5)
WBC: 7.2 10*3/uL (ref 4.0–10.5)
nRBC: 0 % (ref 0.0–0.2)

## 2022-08-21 LAB — COMPREHENSIVE METABOLIC PANEL WITH GFR
ALT: 22 U/L (ref 0–44)
AST: 28 U/L (ref 15–41)
Albumin: 2.6 g/dL — ABNORMAL LOW (ref 3.5–5.0)
Alkaline Phosphatase: 83 U/L (ref 38–126)
Anion gap: 9 (ref 5–15)
BUN: 49 mg/dL — ABNORMAL HIGH (ref 8–23)
CO2: 22 mmol/L (ref 22–32)
Calcium: 8.3 mg/dL — ABNORMAL LOW (ref 8.9–10.3)
Chloride: 110 mmol/L (ref 98–111)
Creatinine, Ser: 1.03 mg/dL — ABNORMAL HIGH (ref 0.44–1.00)
GFR, Estimated: >60 mL/min (ref >60)
Glucose, Bld: 99 mg/dL (ref 70–99)
Potassium: 3.7 mmol/L (ref 3.5–5.1)
Sodium: 141 mmol/L (ref 135–145)
Total Bilirubin: 3.1 mg/dL — ABNORMAL HIGH (ref 0.3–1.2)
Total Protein: 5.4 g/dL — ABNORMAL LOW (ref 6.5–8.1)

## 2022-08-21 LAB — PROTIME-INR
INR: 1.7 — ABNORMAL HIGH (ref 0.8–1.2)
Prothrombin Time: 20.4 s — ABNORMAL HIGH (ref 11.4–15.2)

## 2022-08-21 LAB — MAGNESIUM: Magnesium: 1.8 mg/dL (ref 1.7–2.4)

## 2022-08-21 MED ORDER — SPIRONOLACTONE 25 MG PO TABS: 25.0000 mg | ORAL_TABLET | Freq: Every day | ORAL | Status: DC

## 2022-08-21 MED ORDER — FUROSEMIDE 40 MG PO TABS
40.0000 mg | ORAL_TABLET | Freq: Every day | ORAL | Status: DC
  Administered 2022-08-21 – 2022-08-23 (×3): 40 mg via ORAL
  Filled 2022-08-21 (×3): qty 1

## 2022-08-21 MED ORDER — ROPINIROLE HCL 0.25 MG PO TABS
0.2500 mg | ORAL_TABLET | Freq: Every day | ORAL | Status: DC
  Administered 2022-08-21 – 2022-09-08 (×19): 0.25 mg via ORAL
  Filled 2022-08-21 (×19): qty 1

## 2022-08-21 MED ORDER — DICLOFENAC SODIUM 1 % EX GEL: 2.0000 g | Freq: Four times a day (QID) | CUTANEOUS | Status: DC | PRN

## 2022-08-21 NOTE — TOC Progression Note (Signed)
Transition of Care [facility]) - Progression Note    Patient Details  Name: [name] MRN: [id number] Date of Birth: [birth year]  [facility]) CM/SW Contact  Otelia Santee, LCSW Phone Number: 08/21/2022, 12:53 PM  Clinical Narrative:    Met with pt and son at bedside to discuss recommendation for SNF. Pt and son both decline SNF placement and share that pt is better taken care of at home than in a facility.  Pt lives with son and has living room set up as a suite. Pt has hospital bed, wheelchair, RW, and cane at home. Her son assists with caregiving. Pt is active with HHPT through [facility] and denies need for further services. HH order will need to be placed prior to discharge.    Expected Discharge Plan: Home w Home Health Services Barriers to Discharge: Continued Medical Work up  Expected Discharge Plan and Services In-house Referral: NA Discharge Planning Services: NA Post Acute Care Choice: Resumption of Svcs/PTA Provider, Home Health Living arrangements for the past 2 months: Single Family Home                                       Social Determinants of Health (SDOH) Interventions SDOH Screenings   Food Insecurity: No Food Insecurity (08/11/2022)  Housing: Low Risk  (08/11/2022)  Transportation Needs: No Transportation Needs (08/11/2022)  Utilities: Not At Risk (08/11/2022)  Tobacco Use: Medium Risk (08/11/2022)    Readmission Risk Interventions    08/15/2022    3:20 PM 05/18/2022   10:24 AM 05/06/2022    3:39 PM  Readmission Risk Prevention Plan  Transportation Screening Complete Complete Complete  PCP or Specialist Appt within 5-7 Days  Complete Complete  Home Care Screening  Complete Complete  Medication Review (RN CM)  Complete Complete  Medication Review (RN Care Manager) Complete    PCP or Specialist appointment within 3-5 days of discharge Complete    HRI or Home Care Consult Complete    SW Recovery Care/Counseling Consult Complete     Palliative Care Screening Not Applicable    Skilled Nursing Facility Not Applicable

## 2022-08-21 NOTE — Plan of Care (Signed)
°  Problem: Education: °Goal: Knowledge of General Education information will improve °Description: Including pain rating scale, medication(s)/side effects and non-pharmacologic comfort measures °Outcome: Progressing °  °Problem: Health Behavior/Discharge Planning: °Goal: Ability to manage health-related needs will improve °Outcome: Progressing °  °Problem: Nutrition: °Goal: Adequate nutrition will be maintained °Outcome: Progressing °  °Problem: Coping: °Goal: Level of anxiety will decrease °Outcome: Progressing °  °Problem: Elimination: °Goal: Will not experience complications related to bowel motility °Outcome: Progressing °Goal: Will not experience complications related to urinary retention °Outcome: Progressing °  °Problem: Pain Managment: °Goal: General experience of comfort will improve °Outcome: Progressing °  °Problem: Safety: °Goal: Ability to remain free from injury will improve °Outcome: Progressing °  °Problem: Skin Integrity: °Goal: Risk for impaired skin integrity will decrease °Outcome: Progressing °  °

## 2022-08-21 NOTE — Progress Notes (Signed)
[facility] Progress Note Patient: [name] ZOX:[id number] DOB: September 22, 1955 DOA: 08/11/2022  DOS: the patient was seen and examined on 08/21/2022  Brief hospital course: Ms. Arave is a 67 y/o woman with a history of cirhrosis who presented with septic shock due to cellulitis with Flavobacterium.  Initially with the ICU.  Treated with pressors. Now on midodrine.  ID was consulted as well.  As palliative care.  Assessment and Plan: Acute metabolic encephalopathy Mentation improving.  Most likely in the setting of sepsis. No asterixis at the time of my evaluation Continue current regimen with rifaximin.   Septic shock due to flavobacterium bacteremia, LLE cellulitis and polymicrobial UTI - improving  Treated with azithromycin. On midodrine. Treated with IV pressors. Will be on meropenem.  Last dose 8/17.   Decompensated NASH cirrhosis Coagulopathy, anemia, thrombocytopenia Anasarca Diarrhea GIB  Ascites.   On rifaximin as well as lactulose. Outpatient GI and hepatology follow-up.  AKI w uremia NAGMA Borderline hypokalemia - no obstruction or hydronephrosis on CT imaging, likely pre-renal    Hx HTN HFpEF AS prior TTE 06/2022> EF 60-65%, G1DD, mod AS Monitor for now.  Currently diuresis on hold.   Physical deconditioning Morbid obesity  PT OT been consulted.  Monitor.   Hypothyroidism -synthroid   Depression - effexor    Thoracic spine, T5 wedge deformity -ortho fu outpatient.  Headache. No focal deficits. Continue Tylenol.  Chronic right eye pain on movement towards medial side. Resolved. No red flag sign.  Chronic left ear pain. Outpatient follow-up with ENT recommended.   GOC DNR status    Subjective: No acute complaint.  No nausea or vomiting no fever no chills.  Reports concern for restless leg syndrome.  Physical Exam: Bilateral edema present. Diffuse lesions on face and chest present. Extraocular muscle movement negative for any  pain.  Data Reviewed: I have Reviewed nursing notes, Vitals, and Lab results. Reviewed CBC and BMP.  Reordered CBC and BMP.  Disposition: Status is: Inpatient Remains inpatient appropriate because: Awaiting improvement in blood pressure  heparin injection 5,000 Units Start: 08/18/22 1500 SCDs Start: 08/11/22 1108   Family Communication: Family at bedside Level of care: Telemetry   Vitals:   08/20/22 2046 08/20/22 2059 08/21/22 0418 08/21/22 1409  BP:  110/60 130/84 123/60  Pulse:  77 86 78  Resp: 19 18 18 18   Temp:  98.4 F (36.9 C) 98 F (36.7 C) 97.6 F (36.4 C)  TempSrc:  Oral Oral Oral  SpO2:  96% 92% 98%  Weight:      Height:         Author: Lynden Oxford, MD 08/21/2022 7:17 PM  Please look on [email or web address] to find out who is on call.

## 2022-08-22 DIAGNOSIS — A419 Sepsis, unspecified organism: Secondary | ICD-10-CM | POA: Diagnosis not present

## 2022-08-22 DIAGNOSIS — R6521 Severe sepsis with septic shock: Secondary | ICD-10-CM | POA: Diagnosis not present

## 2022-08-22 LAB — CBC
HCT: 23.7 % — ABNORMAL LOW (ref 36.0–46.0)
Hemoglobin: 7.1 g/dL — ABNORMAL LOW (ref 12.0–15.0)
MCH: 30 pg (ref 26.0–34.0)
MCHC: 30 g/dL (ref 30.0–36.0)
MCV: 100 fL (ref 80.0–100.0)
Platelets: 90 10*3/uL — ABNORMAL LOW (ref 150–400)
RBC: 2.37 MIL/uL — ABNORMAL LOW (ref 3.87–5.11)
RDW: 17.8 % — ABNORMAL HIGH (ref 11.5–15.5)
WBC: 6.4 10*3/uL (ref 4.0–10.5)
nRBC: 0 % (ref 0.0–0.2)

## 2022-08-22 MED ORDER — PHYTONADIONE 5 MG PO TABS
5.0000 mg | ORAL_TABLET | Freq: Once | ORAL | Status: AC
  Administered 2022-08-22: 5 mg via ORAL
  Filled 2022-08-22: qty 1

## 2022-08-22 MED ORDER — PSEUDOEPHEDRINE HCL 30 MG PO TABS
30.0000 mg | ORAL_TABLET | Freq: Two times a day (BID) | ORAL | Status: AC
  Administered 2022-08-22 – 2022-08-23 (×3): 30 mg via ORAL
  Filled 2022-08-22 (×3): qty 1

## 2022-08-22 MED ORDER — PHENYLEPHRINE HCL 10 MG PO TABS: 10.0000 mg | ORAL_TABLET | Freq: Two times a day (BID) | ORAL | Status: DC

## 2022-08-22 MED ORDER — DIPHENHYDRAMINE HCL 25 MG PO CAPS
25.0000 mg | ORAL_CAPSULE | Freq: Two times a day (BID) | ORAL | Status: AC
  Administered 2022-08-22 – 2022-08-23 (×3): 25 mg via ORAL
  Filled 2022-08-22 (×3): qty 1

## 2022-08-22 NOTE — Plan of Care (Signed)

## 2022-08-22 NOTE — Progress Notes (Signed)
PT Cancellation Note  Patient Details Name: [name] MRN: [id number] DOB: May 24, 1955   Cancelled Treatment:    Reason Eval/Treat Not Completed: Fatigue/lethargy limiting ability to participate  Remains lethargic this PM.  Blanchard Kelch PT Acute Rehabilitation Services Office [phone] Weekend [phone]   Rada Hay 08/22/2022, 2:24 PM

## 2022-08-22 NOTE — Progress Notes (Signed)
[facility] Progress Note Patient: [name] WGN:[id number] DOB: 12-Dec-1955 DOA: 08/11/2022  DOS: the patient was seen and examined on 08/22/2022  Brief hospital course: [name] is a 67 y/o woman with a history of cirhrosis who presented with fatigue and shortness of breath. Currently being treated for septic shock due to cellulitis with Flavobacterium.  Initially with the ICU.  Treated with pressors.  Transition to midodrine. ID was consulted on 8/4 for flava bacterium bacteremia. Palliative care was consulted on 8/8 for goals of care currently DNR. Transfer to hospitalist service on 8/9. Transferred out of the stepdown on 8/10. IV antibiotic until 8/17.  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 Met SIRS criteria on admission with lactic acidosis, encephalopathy, AKI, hypotension and tachycardia as well as tachypnea. Admitted to the ICU.  Treated with pressors, IV fluid and IV albumin. Treated with azithromycin for concerns for cat scratch. Transition to midodrine. Blood cultures came back positive for flavobacterium. ID was consulted, Will be on meropenem.  Last dose 8/17.   Decompensated NASH cirrhosis Coagulopathy, anemia, thrombocytopenia Anasarca Diarrhea GIB  Ascites.   On rifaximin as well as lactulose. Outpatient GI and hepatology follow-up. Will check ammonia level.  Treated with IV albumin.  AKI w uremia NAGMA Borderline hypokalemia - no obstruction or hydronephrosis on CT imaging, likely pre-renal. Treated with IV pressors, IV fluid.  Renal function improved significantly. Monitor while initiating diuresis.   HTN Chronic HFpEF Moderate aortic stenosis prior TTE 06/2022> EF 60-65%, G1DD, mod AS Due to hypotension her home medications were on hold. Now that the blood pressure is improving on midodrine will initiate diuresis and monitor response.     Physical deconditioning Morbid obesity  PT OT been consulted.  Monitor.   Hypothyroidism -synthroid   Depression - effexor    Thoracic spine, T5 wedge deformity -ortho fu outpatient.  Headache. No focal deficits. Continue Tylenol.  Chronic right eye pain on movement towards medial side. Resolved. No red flag sign.  Chronic left ear pain. Outpatient follow-up with ENT recommended. Will attempt to treat with Sudafed for possible eustachian tube related pain.   GOC DNR status   Coagulopathy. Due to cirrhosis her INR has remained elevated. Patient was started on heparin in the ICU for DVT prophylaxis. Patient has history of difficulty with bleeding. Currently has minor bleeding from her tongue due to tongue bite earlier. Also has some blood when staff performed [facility] on 8/12. Monitor CBC. Vitamin K x 1. Hold heparin.   Subjective: No nausea no vomiting no fever no chills.  Continues to report earache.  Slept better last night and has less pain in her leg today.  There is some concern from blood in the urine.  Physical Exam: General: in Mild distress, No Rash Cardiovascular: S1 and S2 Present, No Murmur Respiratory: Good respiratory effort, Bilateral Air entry present. No Crackles, No wheezes Abdomen: Bowel Sound present, No tenderness Extremities: No edema Neuro: Alert and oriented x3, no new focal deficit  Data Reviewed: I have Reviewed nursing notes, Vitals, and Lab results. Since last encounter, pertinent lab results CBC and BMP   . I have ordered test including CBC and BMP  .   Disposition: Status is: Inpatient Remains inpatient appropriate because: Awaiting improvement in mentation.  Place and maintain sequential compression device Start: 08/22/22 1149 SCDs Start: 08/11/22 1108   Family Communication: Son at bedside Level of care: Telemetry   Vitals:  08/21/22 1409 08/21/22 1956 08/22/22 0523 08/22/22 1330  BP: 123/60 109/66 (!) 109/51 134/81   Pulse: 78 73 72 93  Resp: 18 18 18    Temp: 97.6 F (36.4 C) (!) 97.4 F (36.3 C) 97.8 F (36.6 C) 97.8 F (36.6 C)  TempSrc: Oral Oral Oral Oral  SpO2: 98% 100% 92% 100%  Weight:      Height:         Author: Lynden Oxford, MD 08/22/2022 5:18 PM  Please look on [email or web address] to find out who is on call.

## 2022-08-22 NOTE — Progress Notes (Signed)
PT Cancellation Note  Patient Details Name: Aneliese Varland MRN: [id number] DOB: [birth year]   Cancelled Treatment:    Reason Eval/Treat Not Completed: Fatigue/lethargy limiting ability to participate  Recently had nausea meds and is very sleepy. Will check back another time. Blanchard Kelch PT Acute Rehabilitation Services Office [phone] Weekend pager-[phone]  Rada Hay 08/22/2022, 11:31 AM

## 2022-08-23 ENCOUNTER — Inpatient Hospital Stay (HOSPITAL_COMMUNITY): Payer: Medicare (Managed Care)

## 2022-08-23 DIAGNOSIS — R6521 Severe sepsis with septic shock: Secondary | ICD-10-CM | POA: Diagnosis not present

## 2022-08-23 DIAGNOSIS — A419 Sepsis, unspecified organism: Secondary | ICD-10-CM | POA: Diagnosis not present

## 2022-08-23 LAB — TYPE AND SCREEN
ABO/RH(D): AB NEG
Antibody Screen: NEGATIVE
Unit division: 0

## 2022-08-23 LAB — BPAM RBC
Blood Product Expiration Date: 202409162359
ISSUE DATE / TIME: 202408132042
Unit Type and Rh: 600

## 2022-08-23 LAB — PREPARE RBC (CROSSMATCH)

## 2022-08-23 MED ORDER — MAGIC MOUTHWASH
5.0000 mL | Freq: Four times a day (QID) | ORAL | Status: DC
  Administered 2022-08-23 – 2022-09-09 (×63): 5 mL via ORAL
  Filled 2022-08-23 (×70): qty 5

## 2022-08-23 MED ORDER — MAGNESIUM SULFATE 2 GM/50ML IV SOLN
2.0000 g | Freq: Once | INTRAVENOUS | Status: AC
  Administered 2022-08-23: 2 g via INTRAVENOUS
  Filled 2022-08-23: qty 50

## 2022-08-23 MED ORDER — SODIUM CHLORIDE 0.9% IV SOLUTION: Freq: Once | INTRAVENOUS | Status: AC

## 2022-08-23 NOTE — Progress Notes (Signed)
[facility] Progress Note Patient: [name] QIH:[id number] DOB: [birth year] DOA: 08/11/2022  DOS: the patient was seen and examined on 08/23/2022  Brief hospital course: Ms. Kelman is a 67 y/o woman with a history of cirhrosis who presented with fatigue and shortness of breath. Currently being treated for septic shock due to cellulitis with Flavobacterium.  Initially with the ICU.  Treated with pressors.  Transition to midodrine. ID was consulted on 8/4 for flava bacterium bacteremia. Palliative care was consulted on 8/8 for goals of care currently DNR. Transferred to hospitalist service on 8/9. Transferred out of the stepdown on 8/10. IV antibiotic until 8/17. Started to have vaginal bleeding on 8/12.  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 Met SIRS criteria on admission with lactic acidosis, encephalopathy, AKI, hypotension and tachycardia as well as tachypnea. Admitted to the ICU. Treated with pressors, IV fluid and IV albumin. Treated with azithromycin for concerns for cat scratch. Transition to midodrine. Blood cultures came back positive for flavobacterium. ID was consulted, Will be on meropenem.  Last dose 8/17.   Decompensated NASH cirrhosis Coagulopathy, anemia, thrombocytopenia Anasarca Diarrhea H/o GIB  Ascites.   On rifaximin as well as lactulose. Outpatient GI and hepatology follow-up. Ammonia level reassuring.  AKI w uremia NAGMA Borderline hypokalemia no obstruction or hydronephrosis on CT imaging, likely pre-renal. Treated with IV pressors, IV fluid.  Renal function improved significantly. Monitor while initiating diuresis.   HTN Chronic HFpEF Moderate aortic stenosis prior TTE 06/2022> EF 60-65%, G1DD, mod AS Due to hypotension her home medications were on hold. Now that the blood pressure is improving on midodrine will initiate diuresis and monitor  response.    Physical deconditioning Morbid obesity  PT OT been consulted.  Monitor.   Hypothyroidism -synthroid   Depression - effexor    Thoracic spine, T5 wedge deformity -ortho fu outpatient.  Headache. No focal deficits. Continue Tylenol.  Chronic right eye pain on movement towards medial side. Resolved. No red flag sign.  Chronic left ear pain. Outpatient follow-up with ENT recommended. Will attempt to treat with Sudafed for possible eustachian tube related pain.   GOC DNR status   Coagulopathy. Postmenopausal vaginal bleeding. Due to cirrhosis her INR has remained elevated. Patient was started on heparin in the ICU for DVT prophylaxis. Patient has history of difficulty with bleeding. Had some minor bleeding from her tongue due to tongue bite earlier. Received Vitamin K x 1 on 8/12. Hold heparin. Will perform ultrasound pelvic contrast with general.  Monitor CBC.  Macrocytic anemia. ?  Acute on chronic blood loss anemia. Will provide 1 PRBC transfusion. If the hemoglobin remains to trend lower patient will require either will be consultation or IR guided evaluation for vaginal embolization.  Hold torsemide in case patient requires any intervention down the road.   Subjective: No nausea no vomiting.  Earache improved.  No fever no chills.  Physical Exam: General: in Mild distress, No Rash Cardiovascular: S1 and S2 Present, No Murmur Respiratory: Good respiratory effort, Bilateral Air entry present. No Crackles, No wheezes Abdomen: Bowel Sound present, No tenderness Extremities: No edema Neuro: Alert and oriented x3, no new focal deficit  Data Reviewed: I have Reviewed nursing notes, Vitals, and Lab results. Since last encounter, pertinent lab results CBC and BMP   . I have ordered test including CBC and BMP  .   Disposition: Status is: Inpatient Remains inpatient appropriate because: Needing for transfusion  Place  and maintain sequential  compression device Start: 08/22/22 1149 SCDs Start: 08/11/22 1108   Family Communication: No one at bedside. Level of care: Telemetry   Vitals:   08/22/22 1330 08/22/22 2243 08/23/22 0345 08/23/22 1322  BP: 134/81 (!) 114/58 128/68 121/66  Pulse: 93 77 89 85  Resp:  18 20 18   Temp: 97.8 F (36.6 C)  98.4 F (36.9 C) 98.5 F (36.9 C)  TempSrc: Oral     SpO2: 100% 97% 95% 98%  Weight:      Height:         Author: Lynden Oxford, MD 08/23/2022 6:24 PM  Please look on [email or web address] to find out who is on call.

## 2022-08-23 NOTE — Progress Notes (Signed)
Physical Therapy Treatment Patient Details Name: [name] MRN: [id number] DOB: 16-Feb-1955 Today's Date: 08/23/2022   History of Present Illness Pt is 67 yo female admitted on [birth year]04/1955 with septic shock due to L LE cellulitis and UTI.  Pt with hx including but not limited to NASH cirrhosis, HTN, PE, short gut syndrome, morbid obesity, IDA, pAF, HTN, HLD    PT Comments  Pt transferred bed to recliner with min hand held assist of 2 (no walker was available in pt's room). Overall pt required less assistance for mobility today.     If plan is discharge home, recommend the following: A lot of help with bathing/dressing/bathroom;A lot of help with walking and/or transfers;Assistance with cooking/housework;Help with stairs or ramp for entrance   Can travel by private vehicle        Equipment Recommendations  None recommended by PT    Recommendations for Other Services       Precautions / Restrictions Precautions Precautions: Fall Precaution Comments: peri-area excoriated - incontinent and frequent loose stools Restrictions Weight Bearing Restrictions: No     Mobility  Bed Mobility Overal bed mobility: Needs Assistance Bed Mobility: Rolling, Sidelying to Sit Rolling: Supervision Sidelying to sit: Contact guard assist, HOB elevated, Used rails       General bed mobility comments: CGA for safety as pt was mildly unsteady initially upon sitting    Transfers Overall transfer level: Needs assistance Equipment used: 2 person hand held assist Transfers: Sit to/from Stand, Bed to chair/wheelchair/BSC Sit to Stand: Min assist, +2 safety/equipment   Step pivot transfers: +2 safety/equipment       General transfer comment: no RW in room so used hand held assist of 2    Ambulation/Gait                   Stairs             Wheelchair Mobility     Tilt Bed    Modified Rankin (Stroke Patients Only)       Balance Overall balance assessment: Needs  assistance Sitting-balance support: Bilateral upper extremity supported Sitting balance-Leahy Scale: Poor Sitting balance - Comments: EOB with supervision but bil UE support.   Standing balance support: During functional activity, Reliant on assistive device for balance, Bilateral upper extremity supported Standing balance-Leahy Scale: Poor                              Cognition Arousal: Alert Behavior During Therapy: WFL for tasks assessed/performed Overall Cognitive Status: Within Functional Limits for tasks assessed                                          Exercises      General Comments        Pertinent Vitals/Pain Pain Assessment Pain Assessment: No/denies pain Pain Score: 0-No pain Breathing: normal Negative Vocalization: none Facial Expression: smiling or inexpressive Body Language: relaxed Consolability: no need to console PAINAD Score: 0    Home Living                          Prior Function            PT Goals (current goals can now be found in the care plan section) Acute Rehab PT Goals Patient Stated Goal: To Walk;  To decrease pain PT Goal Formulation: With patient/family Time For Goal Achievement: 09/02/22 Potential to Achieve Goals: Good Progress towards PT goals: Progressing toward goals    Frequency    Min 1X/week      PT Plan      Co-evaluation              AM-PAC PT "6 Clicks" Mobility   Outcome Measure  Help needed turning from your back to your side while in a flat bed without using bedrails?: A Little Help needed moving from lying on your back to sitting on the side of a flat bed without using bedrails?: A Lot Help needed moving to and from a bed to a chair (including a wheelchair)?: A Little Help needed standing up from a chair using your arms (e.g., wheelchair or bedside chair)?: A Lot Help needed to walk in hospital room?: Total Help needed climbing 3-5 steps with a railing? :  Total 6 Click Score: 12    End of Session Equipment Utilized During Treatment: Gait belt Activity Tolerance: Patient tolerated treatment well Patient left: in chair;with call bell/phone within reach;with nursing/sitter in room Nurse Communication: Mobility status PT Visit Diagnosis: Other abnormalities of gait and mobility (R26.89);Muscle weakness (generalized) (M62.81)     Time: 9629-5284 PT Time Calculation (min) (ACUTE ONLY): 10 min  Charges:    $Therapeutic Activity: 8-22 mins PT General Charges $$ ACUTE PT VISIT: 1 Visit                     Tamala Ser PT 08/23/2022  Acute Rehabilitation Services  Office [phone]

## 2022-08-23 NOTE — Plan of Care (Signed)

## 2022-08-24 DIAGNOSIS — R6521 Severe sepsis with septic shock: Secondary | ICD-10-CM | POA: Diagnosis not present

## 2022-08-24 DIAGNOSIS — A419 Sepsis, unspecified organism: Secondary | ICD-10-CM | POA: Diagnosis not present

## 2022-08-24 MED ORDER — PHYTONADIONE 5 MG PO TABS
5.0000 mg | ORAL_TABLET | Freq: Every day | ORAL | Status: AC
  Administered 2022-08-24 – 2022-08-27 (×4): 5 mg via ORAL
  Filled 2022-08-24 (×4): qty 1

## 2022-08-24 MED ORDER — TRANEXAMIC ACID 650 MG PO TABS
650.0000 mg | ORAL_TABLET | Freq: Three times a day (TID) | ORAL | Status: AC
  Administered 2022-08-24 – 2022-08-25 (×3): 650 mg via ORAL
  Filled 2022-08-24 (×3): qty 1

## 2022-08-24 MED ORDER — LIDOCAINE 5 % EX PTCH
2.0000 | MEDICATED_PATCH | CUTANEOUS | Status: DC
  Administered 2022-08-24 – 2022-09-08 (×12): 2 via TRANSDERMAL
  Filled 2022-08-24 (×16): qty 2

## 2022-08-24 NOTE — Progress Notes (Signed)
Nutrition Follow-up  DOCUMENTATION CODES:   Morbid obesity  INTERVENTION:   -[employer] 1.4 PO daily, each provides 455 kcals and 20g protein  NUTRITION DIAGNOSIS:   Inadequate oral intake related to acute illness, lethargy/confusion as evidenced by energy intake < or equal to 50% for > or equal to 5 days.  Ongoing.  GOAL:   Patient will meet greater than or equal to 90% of their needs  Progressing.  MONITOR:   PO intake, Labs  ASSESSMENT:   67 year old female with PMH NASH cirrhosis, HE, morbid obesity, and MDR UTI (e. 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 Found to have multifactorial shock.  8/1: Admit 8/5: NGT placed 8/7: SLP eval-Reg diet; NGT removed  Patient currently consuming 75% of meals.  Pt accepting [employer] daily.  Admission weight: 235 lbs Current weight: 286 lbs Per nursing documentation, pt with mild BLE edema.  Medications: Pepcid, Magic mouthwash, Zinc sulfate  Labs reviewed.  Diet Order:   Diet Order             Diet 2 gram sodium Room service appropriate? Yes; Fluid consistency: Thin  Diet effective now                   EDUCATION NEEDS:   Education needs have been addressed  Skin:  Skin Assessment: Reviewed RN Assessment Skin Integrity Issues:: Other (Comment) Other: Skin tear right buttocks  Last BM:  8/13 -type 5 & 6  Height:   Ht Readings from Last 1 Encounters:  08/11/22 5\' 2"  (1.575 m)    Weight:   Wt Readings from Last 1 Encounters:  08/19/22 130.1 kg    BMI:  Body mass index is 52.46 kg/m.  Estimated Nutritional Needs:   Kcal:  1750-2000 kcals  Protein:  75-100 grams  Fluid:  >/= 1.8L  Tilda Franco, MS, RD, LDN Inpatient Clinical Dietitian Contact information available via Amion

## 2022-08-24 NOTE — Care Management Important Message (Signed)
Important Message  Patient Details IM Letter given. Name: [name] MRN: [id number] Date of Birth: 1955-05-18   Medicare Important Message Given:  Yes     [name] 08/24/2022, 12:19 PM

## 2022-08-24 NOTE — Plan of Care (Signed)

## 2022-08-24 NOTE — Progress Notes (Signed)
[facility] Progress Note Patient: [name] ZOX:[id number] DOB: July 13, 1955 DOA: 08/11/2022  DOS: the patient was seen and examined on 08/24/2022  Brief hospital course: Ms. Ro is a 67 y/o woman with a history of cirhrosis who presented with fatigue and shortness of breath. Currently being treated for septic shock due to cellulitis with Flavobacterium.  Initially with the ICU.  Treated with pressors.  Transition to midodrine. ID was consulted on 8/4 for flava bacterium bacteremia. Palliative care was consulted on 8/8 for goals of care currently DNR. Transferred to hospitalist service on 8/9. Transferred out of the stepdown on 8/10. IV antibiotic until 8/17. Started to have vaginal bleeding on 8/12.  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 Met SIRS criteria on admission with lactic acidosis, encephalopathy, AKI, hypotension and tachycardia as well as tachypnea. Admitted to the ICU. Treated with pressors, IV fluid and IV albumin. Treated with azithromycin for concerns for cat scratch. Transition to midodrine. Blood cultures came back positive for flavobacterium. ID was consulted, Will be on meropenem.  Last dose 8/17.   Decompensated NASH cirrhosis Anasarca, H/o GIB, Ascites.   Chronic diarrhea. On rifaximin as well as lactulose.  Lactulose on hold due to diarrhea. Lasix and Aldactone on hold due to hypotension. Outpatient GI and hepatology follow-up. Ammonia level reassuring.  AKI w uremia NAGMA Borderline hypokalemia no obstruction or hydronephrosis on CT imaging, likely pre-renal. Treated with IV pressors, IV fluid.  Renal function improved significantly. Monitor while initiating diuresis.   HTN Chronic HFpEF Moderate aortic stenosis prior TTE 06/2022> EF 60-65%, G1DD, mod AS Due to hypotension her home medications were on hold. Now that the blood pressure is  improving on midodrine will initiate diuresis and monitor response.  Goals of care conversation Palliative care was consulted, currently DNR.  Coagulopathy due to cirrhosis Postmenopausal vaginal bleeding. Due to cirrhosis her INR has remained elevated. Patient was started on heparin in the ICU for DVT prophylaxis. Patient has history of spontaneous bleeding. Had some minor bleeding from her tongue due to tongue bite earlier. No appears to have some vaginal bleeding.  Received Vitamin K x 1 on 8/12.  Will continue for 5 more days. Hold heparin. Ultrasound pelvic shows evidence of fibroid uterus.  Unable to perform transvaginal. There appears to be a skin tear as well on her perineal area which is also bleeding. Discussed with hematology as well as GYN on-call, on 8/14, will initiate tranexamic acid twice daily with the hope that it will stop the bleeding.  Macrocytic anemia. Acute on chronic blood loss anemia. SP 1 PRBC transfusion on 8/13. As above initiating TXA. If the hemoglobin remains to trend lower patient will require either will be consultation or IR guided evaluation for vaginal embolization.   Physical deconditioning Morbid obesity  Body mass index is 52.46 kg/m.  Placing the patient at high risk for outcome. Reportedly has no sleep apnea based on the sleep study. PT OT been consulted.  SNF recommended.  Patient would like to go home.  Has hospital bed at home.  Hypothyroidism Continue synthroid   Depression Continue effexor    Thoracic spine, T5 wedge deformity Seen incidentally. Continue pain control. Outpatient follow-up.  Headache. No focal deficits. Continue Tylenol.  Chronic right eye pain on movement towards medial side. Resolved. No red flag sign.  Chronic left ear pain. Outpatient follow-up with ENT recommended. Treated with Sudafed. Suspect possible eustachian tube related pain.  Subjective: Ongoing bleeding reported.  No nausea, vomiting  fever no chills.  No chest pain.  Physical Exam: General: in Mild distress, No Rash Cardiovascular: S1 and S2 Present, No Murmur Respiratory: Good respiratory effort, Bilateral Air entry present. No Crackles, No wheezes Abdomen: Bowel Sound present, No tenderness Extremities: Bilateral edema Neuro: Alert and oriented x3, no new focal deficit  Data Reviewed: I have Reviewed nursing notes, Vitals, and Lab results. Since last encounter, pertinent lab results CBC and BMP   . I have ordered test including CBC and BMP  . I have discussed pt's care plan and test results with hematology and GYN   .   Disposition: Status is: Inpatient Remains inpatient appropriate because: Ongoing bleeding monitoring for stability.   Place and maintain sequential compression device Start: 08/22/22 1149 SCDs Start: 08/11/22 1108   Family Communication: Son at bedside Level of care: Telemetry   Vitals:   08/23/22 2105 08/23/22 2105 08/24/22 0434 08/24/22 1259  BP: 137/84 137/84 129/81 114/62  Pulse: 80 80 88 91  Resp: 18 18 18 20   Temp: 98.1 F (36.7 C) 98.1 F (36.7 C) 98.7 F (37.1 C)   TempSrc:      SpO2:  96% 95% 95%  Weight:      Height:         Author: Lynden Oxford, MD 08/24/2022 6:01 PM  Please look on [email or web address] to find out who is on call.

## 2022-08-24 NOTE — Plan of Care (Signed)

## 2022-08-24 NOTE — Progress Notes (Addendum)
Occupational Therapy Treatment Patient Details Name: [name] MRN: [id number] DOB: [birth year] Today's Date: 08/24/2022   History of present illness Pt is 67 yo female admitted on 08/22/1955 with septic shock due to L LE cellulitis and UTI.  Pt with hx including but not limited to NASH cirrhosis, HTN, PE, short gut syndrome, morbid obesity, IDA, pAF, HTN, HLD   OT comments  This 67 yo female seen today to see how she was mobilizing for basic ADLs. She really struggled with getting OOB from air bed totally deflated--had to re-inflate it partially then she was Mod A to get to the EOB, pt min A sit<>stand and Min A +2 for stand turn from bed to recliner (at which point she said she was worn out). Son A with all LB ADLs. RN contacted about inter-dry for pt at abdominal folds and it was ordered per RN. Son very supportive of patient with their goal of getting her back home and on a trip they have planned for her 67th birthday next month. She will continue to benefit from acute OT with HHOT now recommended.      If plan is discharge home, recommend the following:  Two people to help with walking and/or transfers;Two people to help with bathing/dressing/bathroom;Help with stairs or ramp for entrance;Assist for transportation   Equipment Recommendations  None recommended by OT       Precautions / Restrictions Precautions Precautions: Fall Precaution Comments: peri-area excoriated - incontinent and frequent loose stools Restrictions Weight Bearing Restrictions: No       Mobility Bed Mobility Overal bed mobility: Needs Assistance Bed Mobility: Supine to Sit   Sidelying to sit: HOB elevated, Used rails, Mod assist (bed deflated)            Transfers Overall transfer level: Needs assistance Equipment used: 2 person hand held assist Transfers: Sit to/from Stand, Bed to chair/wheelchair/BSC Sit to Stand: Min assist     Step pivot transfers: Min assist, +2 physical assistance, +2  safety/equipment           Balance Overall balance assessment: Needs assistance Sitting-balance support: Feet supported, Feet unsupported Sitting balance-Leahy Scale: Fair     Standing balance support: Bilateral upper extremity supported Standing balance-Leahy Scale: Poor                             ADL either performed or assessed with clinical judgement   ADL Overall ADL's : Needs assistance/impaired                         Toilet Transfer: Minimal assistance;+2 for physical assistance;+2 for safety/equipment;Stand-pivot; simulated bed to recliner transfers Bil HHA Toilet Transfer Details (indicate cue type and reason): Bil UE support Toileting- Clothing Manipulation and Hygiene: Total assistance Toileting - Clothing Manipulation Details (indicate cue type and reason): min A sit<>stand            Extremity/Trunk Assessment Upper Extremity Assessment Upper Extremity Assessment: Generalized weakness            Vision Patient Visual Report: No change from baseline            Cognition Arousal: Alert Behavior During Therapy: WFL for tasks assessed/performed Overall Cognitive Status: Within Functional Limits for tasks assessed  Pertinent Vitals/ Pain       Pain Assessment Pain Assessment: Faces Faces Pain Scale: Hurts even more Pain Location: back (mid) with getting OOB Pain Descriptors / Indicators: Grimacing, Guarding, Aching, Sore Pain Intervention(s): Limited activity within patient's tolerance, Monitored during session, Repositioned, Patient requesting pain meds-RN notified (offered heat or ice (pt declined))         Frequency  Min 1X/week        Progress Toward Goals  OT Goals(current goals can now be found in the care plan section)  Progress towards OT goals: Progressing toward goals  Acute Rehab OT Goals Patient Stated Goal: to keep getting  stronger and go on trip planned in September for her 89 birthday OT Goal Formulation: With patient/family Time For Goal Achievement: 09/02/22 Potential to Achieve Goals: Good         AM-PAC OT "6 Clicks" Daily Activity     Outcome Measure   Help from another person eating meals?: A Little Help from another person taking care of personal grooming?: A Little Help from another person toileting, which includes using toliet, bedpan, or urinal?: A Lot Help from another person bathing (including washing, rinsing, drying)?: A Lot Help from another person to put on and taking off regular upper body clothing?: A Lot Help from another person to put on and taking off regular lower body clothing?: Total 6 Click Score: 13    End of Session    OT Visit Diagnosis: Other abnormalities of gait and mobility (R26.89);Unsteadiness on feet (R26.81);Muscle weakness (generalized) (M62.81);Pain Pain - part of body:  (mid back)   Activity Tolerance Patient tolerated treatment well   Patient Left in chair;with call bell/phone within reach;with chair alarm set;with family/visitor present   Nurse Communication Patient requests pain meds (pt and son asking about interdry)        Time: 4098-1191 OT Time Calculation (min): 26 min  Charges: OT General Charges $OT Visit: 1 Visit OT Treatments $Self Care/Home Management : 23-37 mins Lindon Romp OT Acute Rehabilitation Services Office [phone]    Evette Georges 08/24/2022, 3:28 PM

## 2022-08-25 ENCOUNTER — Inpatient Hospital Stay (HOSPITAL_COMMUNITY): Payer: Medicare (Managed Care)

## 2022-08-25 ENCOUNTER — Inpatient Hospital Stay: Payer: Medicare (Managed Care) | Admitting: Family

## 2022-08-25 ENCOUNTER — Encounter: Payer: Self-pay | Admitting: Family Medicine

## 2022-08-25 DIAGNOSIS — N179 Acute kidney failure, unspecified: Secondary | ICD-10-CM | POA: Diagnosis not present

## 2022-08-25 DIAGNOSIS — L03116 Cellulitis of left lower limb: Secondary | ICD-10-CM | POA: Diagnosis not present

## 2022-08-25 DIAGNOSIS — K7290 Hepatic failure, unspecified without coma: Secondary | ICD-10-CM | POA: Diagnosis not present

## 2022-08-25 DIAGNOSIS — A419 Sepsis, unspecified organism: Secondary | ICD-10-CM | POA: Diagnosis not present

## 2022-08-25 LAB — CBC
HCT: 26.8 % — ABNORMAL LOW (ref 36.0–46.0)
Hemoglobin: 8.3 g/dL — ABNORMAL LOW (ref 12.0–15.0)
MCH: 30.5 pg (ref 26.0–34.0)
MCHC: 31 g/dL (ref 30.0–36.0)
MCV: 98.5 fL (ref 80.0–100.0)
Platelets: 105 10*3/uL — ABNORMAL LOW (ref 150–400)
RBC: 2.72 MIL/uL — ABNORMAL LOW (ref 3.87–5.11)
RDW: 19.2 % — ABNORMAL HIGH (ref 11.5–15.5)
WBC: 8.1 10*3/uL (ref 4.0–10.5)
nRBC: 0 % (ref 0.0–0.2)

## 2022-08-25 LAB — BASIC METABOLIC PANEL
Anion gap: 6 (ref 5–15)
BUN: 31 mg/dL — ABNORMAL HIGH (ref 8–23)
CO2: 23 mmol/L (ref 22–32)
Calcium: 8 mg/dL — ABNORMAL LOW (ref 8.9–10.3)
Chloride: 112 mmol/L — ABNORMAL HIGH (ref 98–111)
Creatinine, Ser: 1.05 mg/dL — ABNORMAL HIGH (ref 0.44–1.00)
GFR, Estimated: 59 mL/min — ABNORMAL LOW (ref >60)
Glucose, Bld: 119 mg/dL — ABNORMAL HIGH (ref 70–99)
Potassium: 3.5 mmol/L (ref 3.5–5.1)
Sodium: 141 mmol/L (ref 135–145)

## 2022-08-25 LAB — PROTIME-INR
INR: 1.9 — ABNORMAL HIGH (ref 0.8–1.2)
Prothrombin Time: 21.9 s — ABNORMAL HIGH (ref 11.4–15.2)

## 2022-08-25 LAB — MAGNESIUM: Magnesium: 1.5 mg/dL — ABNORMAL LOW (ref 1.7–2.4)

## 2022-08-25 MED ORDER — BARIUM SULFATE 2 % PO SUSP
900.0000 mL | Freq: Once | ORAL | Status: AC
  Administered 2022-08-25: 900 mL via ORAL

## 2022-08-25 MED ORDER — ALTEPLASE 2 MG IJ SOLR
2.0000 mg | Freq: Once | INTRAMUSCULAR | Status: AC
  Administered 2022-08-25: 2 mg
  Filled 2022-08-25 (×2): qty 2

## 2022-08-25 MED ORDER — MEGESTROL ACETATE 40 MG PO TABS
40.0000 mg | ORAL_TABLET | Freq: Two times a day (BID) | ORAL | Status: DC
  Administered 2022-08-25 – 2022-09-08 (×27): 40 mg via ORAL
  Filled 2022-08-25 (×28): qty 1

## 2022-08-25 MED ORDER — DIPHENHYDRAMINE HCL 25 MG PO CAPS
50.0000 mg | ORAL_CAPSULE | Freq: Once | ORAL | Status: AC
  Administered 2022-08-25: 50 mg via ORAL
  Filled 2022-08-25: qty 2

## 2022-08-25 MED ORDER — ALTEPLASE 2 MG IJ SOLR
2.0000 mg | Freq: Once | INTRAMUSCULAR | Status: AC
  Administered 2022-08-25: 2 mg
  Filled 2022-08-25: qty 2

## 2022-08-25 MED ORDER — MAGNESIUM SULFATE 50 % IJ SOLN
4.0000 g | Freq: Once | INTRAVENOUS | Status: AC
  Administered 2022-08-25: 4 g via INTRAVENOUS
  Filled 2022-08-25: qty 8

## 2022-08-25 MED ORDER — DIPHENHYDRAMINE HCL 50 MG/ML IJ SOLN: 50.0000 mg | Freq: Once | INTRAMUSCULAR | Status: AC

## 2022-08-25 MED ORDER — METHYLPREDNISOLONE SODIUM SUCC 40 MG IJ SOLR
40.0000 mg | Freq: Once | INTRAMUSCULAR | Status: AC
  Administered 2022-08-25: 40 mg via INTRAVENOUS
  Filled 2022-08-25: qty 1

## 2022-08-25 NOTE — Plan of Care (Signed)

## 2022-08-25 NOTE — Progress Notes (Signed)
[facility]                                                                              [name], is a 67 y.o. female, DOB - [birth year], HQI:[id number] Admit date - 08/11/2022    Outpatient Primary MD for the patient is [name], MD  LOS - 14  days  Chief Complaint  Patient presents with   Hypotension   Shortness of Breath       Brief summary   Patient is a 67 y/o woman with a history of cirhrosis who presented with fatigue and shortness of breath. Currently being treated for septic shock due to cellulitis with Flavobacterium.  Initially with the ICU.  Treated with pressors.  Transition to midodrine. ID was consulted on 8/4 for flava bacterium bacteremia. Palliative care was consulted on 8/8 for goals of care currently DNR. Transferred to hospitalist service on 8/9. Transferred out of the stepdown on 8/10. IV antibiotic until 8/17. Started to have vaginal bleeding on 8/12.   Assessment & Plan    Principal Problem:  Septic shock (HCC) with AKI, multifactorial Flavobacterium bacteremia, LLE cellulitis and polymicrobial UTI Concern for cat scratch fever -Met criteria on admission with lactic acidosis, encephalopathy, AKI, hypotension, tachycardia and tachypnea -Patient was admitted to ICU, was placed on pressors, fluids and IV albumin -Patient was treated with cefepime and Zithromax (for 5 days to cover cat scratch/Bartonella) -Blood cultures positive for flavobacterium. - ID was consulted, placed on meropenem for 2 weeks, EOT on 08/27/2022  -Remove PICC line after completion of antibiotics     Decompensated NASH cirrhosis with ascites Coagulopathy, anemia, thrombocytopenia, anasarca H/o GIB  -Continue rifaximin, lactulose -Currently alert and oriented, outpatient GI follow-up -Lasix and Aldactone on hold due to hypotension.   -Lactulose placed on prn due to diarrhea   AKI with uremia, metabolic acidosis, - no obstruction or  hydronephrosis on CT imaging, likely pre-renal. - Treated with IV pressors, IV fluid.  - Renal function has improved -Lasix and Aldactone on hold due to hypotension   Coagulopathy. Postmenopausal vaginal bleeding. Macrocytic anemia with acute on chronic blood loss anemia - Due to cirrhosis her INR has remained elevated. - Patient was started on heparin in the ICU for DVT prophylaxis. -Per son, patient has a history of spontaneous bleeding and concerned about bleeding when PICC line is removed. Had some minor bleeding from her tongue due to tongue bite earlier. -Received Vitamin K x 1 on 8/12, continued for 5 days. - Hold heparin.  CT abdomen due to complaints of abdominal pain, back pain, rule out RPH or acute abd pathology -Pelvic ultrasound showed myomatous uterus with several fibroids - Dr Allena Katz discussed with hematology and GYN on-call, recommended tranexamic acid twice daily -Hemoglobin 8.3, transfuse for hemoglobin <8 -Still having vaginal bleeding, d/w Dr Debroah Loop (GYN on-call), recommended Megace 40 mg p.o. twice daily for 2 weeks, will arrange outpatient follow-up at OB/GYN center for further evaluation outpatient.     HTN Chronic HFpEF Moderate aortic stenosis -2D echo 06/2022> EF 60-65%, G1DD, mod AS -Due to hypotension, her home medications were on hold.  Physical deconditioning -PT evaluation   Hypothyroidism -Continue synthroid   Depression -Continue effexor    Thoracic spine, T5 wedge deformity -ortho follow-up outpatient.   Headache. No focal deficits. Continue Tylenol.   Chronic right eye pain on movement towards medial side. Resolved. No red flag sign.   Chronic left ear pain. Outpatient follow-up with ENT recommended. Will attempt to treat with Sudafed for possible eustachian tube related pain.   Obesity Estimated body mass index is 52.46 kg/m as calculated from the following:   Height as of this encounter: 5\' 2"  (1.575 m).   Weight as of  this encounter: 130.1 kg.  Code Status: DNR DVT Prophylaxis:  Place and maintain sequential compression device Start: 08/22/22 1149 SCDs Start: 08/11/22 1108   Level of Care: Level of care: Telemetry Family Communication: Updated patient's son at bedside Disposition Plan:      Remains inpatient appropriate: Needs to complete IV antibiotics till 08/27/2022   Procedures:    Consultants:   CCM, ID, GYN  Antimicrobials:   Anti-infectives (From admission, onward)    Start     Dose/Rate Route Frequency Ordered Stop   08/20/22 1200  meropenem (MERREM) 1 g in sodium chloride 0.9 % 100 mL IVPB        1 g 200 mL/hr over 30 Minutes Intravenous Every 8 hours 08/20/22 1101 08/27/22 2359   08/14/22 1800  azithromycin (ZITHROMAX) 250 mg in dextrose 5 % 125 mL IVPB  Status:  Discontinued        250 mg 127.5 mL/hr over 60 Minutes Intravenous Daily 08/14/22 1738 08/14/22 1752   08/14/22 1800  azithromycin (ZITHROMAX) 500 mg in sodium chloride 0.9 % 250 mL IVPB  Status:  Discontinued        500 mg 250 mL/hr over 60 Minutes Intravenous Daily 08/14/22 1752 08/15/22 1054   08/14/22 1500  meropenem (MERREM) 1 g in sodium chloride 0.9 % 100 mL IVPB  Status:  Discontinued        1 g 200 mL/hr over 30 Minutes Intravenous Every 12 hours 08/14/22 1354 08/20/22 1101   08/13/22 2100  ceFEPIme (MAXIPIME) 2 g in sodium chloride 0.9 % 100 mL IVPB  Status:  Discontinued        2 g 200 mL/hr over 30 Minutes Intravenous Every 24 hours 08/13/22 0436 08/13/22 1944   08/13/22 2100  Cefiderocol Sulfate Tosylate (FETROJA) 1 g in sodium chloride 0.9 % 100 mL IVPB  Status:  Discontinued        1 g 37.1 mL/hr over 3 Hours Intravenous Every 8 hours 08/13/22 1944 08/14/22 1353   08/13/22 0800  vancomycin (VANCOREADY) IVPB 1250 mg/250 mL  Status:  Discontinued        1,250 mg 166.7 mL/hr over 90 Minutes Intravenous Every 48 hours 08/11/22 1415 08/12/22 0717   08/12/22 2200  ceFEPIme (MAXIPIME) 2 g in sodium chloride 0.9  % 100 mL IVPB  Status:  Discontinued        2 g 200 mL/hr over 30 Minutes Intravenous Every 12 hours 08/12/22 1743 08/13/22 0436   08/12/22 1000  azithromycin (ZITHROMAX) tablet 250 mg  Status:  Discontinued       Placed in "Followed by" Linked Group   250 mg Oral Daily 08/11/22 1353 08/14/22 1738   08/11/22 2000  ceFEPIme (MAXIPIME) 2 g in sodium chloride 0.9 % 100 mL IVPB  Status:  Discontinued        2 g 200 mL/hr over 30 Minutes Intravenous Every 12  hours 08/11/22 1415 08/12/22 1743   08/11/22 1500  rifaximin (XIFAXAN) tablet 550 mg        550 mg Oral 2 times daily 08/11/22 1413     08/11/22 1445  azithromycin (ZITHROMAX) tablet 500 mg       Placed in "Followed by" Linked Group   500 mg Oral  Once 08/11/22 1353 08/11/22 1501   08/11/22 0745  vancomycin (VANCOCIN) IVPB 1000 mg/200 mL premix  Status:  Discontinued        1,000 mg 200 mL/hr over 60 Minutes Intravenous  Once 08/11/22 0739 08/11/22 0740   08/11/22 0745  clindamycin (CLEOCIN) IVPB 600 mg        600 mg 100 mL/hr over 30 Minutes Intravenous  Once 08/11/22 0739 08/11/22 1004   08/11/22 0745  vancomycin (VANCOREADY) IVPB 2000 mg/400 mL        2,000 mg 200 mL/hr over 120 Minutes Intravenous STAT 08/11/22 0740 08/11/22 1030   08/11/22 0745  meropenem (MERREM) 1 g in sodium chloride 0.9 % 100 mL IVPB        1 g 200 mL/hr over 30 Minutes Intravenous STAT 08/11/22 0740 08/11/22 0914          Medications  famotidine  20 mg Oral QAC breakfast   feeding supplement (K[employer] STANDARD 1.4)  325 mL Oral Daily   ketotifen  1 drop Right Eye BID   levothyroxine  150 mcg Oral QAC breakfast   lidocaine  2 patch Transdermal Q24H   magic mouthwash  5 mL Oral QID   midodrine  10 mg Oral TID WC   nystatin cream   Topical BID   pantoprazole  40 mg Oral BID AC   phytonadione  5 mg Oral Daily   rifaximin  550 mg Oral BID   rOPINIRole  0.25 mg Oral QHS   sodium chloride flush  10-40 mL Intracatheter Q12H   tamsulosin  0.4 mg Oral  Daily   venlafaxine XR  75 mg Oral Q breakfast   zinc sulfate  220 mg Oral Daily      Subjective:   [name] was seen and examined today. + ongoing vaginal bleeding.  No chest pain, nausea vomiting, shortness of breath, fevers. + Back pain  Objective:   Vitals:   08/24/22 1259 08/24/22 1951 08/25/22 0642 08/25/22 1330  BP: 114/62 129/70 136/65 (!) 141/75  Pulse: 91 86 86 88  Resp: 20 18 17 18   Temp:  98.6 F (37 C) 98 F (36.7 C)   TempSrc:  Oral Oral   SpO2: 95% 97% 96% 96%  Weight:      Height:        Intake/Output Summary (Last 24 hours) at 08/25/2022 1450 Last data filed at 08/25/2022 1306 Gross per 24 hour  Intake 180 ml  Output --  Net 180 ml     Wt Readings from Last 3 Encounters:  08/19/22 130.1 kg  07/29/22 106.6 kg  06/15/22 106.6 kg     Exam General: Alert and oriented x 3, NAD Cardiovascular: S1 S2 auscultated,  RRR Respiratory: Clear to auscultation bilaterally, no wheezing Gastrointestinal: Soft, mild lower abdomen TTP, ND, NBS  Ext: + pedal edema bilaterally Neuro: no new FND's Psych: Normal affect     Data Reviewed:  I have personally reviewed following labs    CBC Lab Results  Component Value Date   WBC 8.1 08/25/2022   RBC 2.72 (L) 08/25/2022   HGB 8.3 (L) 08/25/2022   HCT  26.8 (L) 08/25/2022   MCV 98.5 08/25/2022   MCH 30.5 08/25/2022   PLT 105 (L) 08/25/2022   MCHC 31.0 08/25/2022   RDW 19.2 (H) 08/25/2022   LYMPHSABS 1.6 08/19/2022   MONOABS 0.8 08/19/2022   EOSABS 0.5 08/19/2022   BASOSABS 0.0 08/19/2022     Last metabolic panel Lab Results  Component Value Date   NA 141 08/25/2022   K 3.5 08/25/2022   CL 112 (H) 08/25/2022   CO2 23 08/25/2022   BUN 31 (H) 08/25/2022   CREATININE 1.05 (H) 08/25/2022   GLUCOSE 119 (H) 08/25/2022   GFRNONAA 59 (L) 08/25/2022   GFRAA >60 01/19/2019   CALCIUM 8.0 (L) 08/25/2022   PHOS 4.6 08/18/2022   PROT 5.0 (L) 08/23/2022   ALBUMIN 2.5 (L) 08/23/2022   BILITOT 2.1 (H)  08/23/2022   ALKPHOS 91 08/23/2022   AST 25 08/23/2022   ALT 21 08/23/2022   ANIONGAP 6 08/25/2022    CBG (last 3)  No results for input(s): "GLUCAP" in the last 72 hours.    Coagulation Profile: Recent Labs  Lab 08/21/22 0453 08/22/22 0235 08/23/22 0334 08/24/22 0438 08/25/22 0300  INR 1.7* 1.9* 1.9* 1.6* 1.9*     Radiology Studies: I have personally reviewed the imaging studies  US PELVIC COMPLETE WITH TRANSVAGINAL  Result Date: 08/23/2022 CLINICAL DATA:  Postmenopausal bleeding. EXAM: TRANSABDOMINAL AND TRANSVAGINAL ULTRASOUND OF PELVIS TECHNIQUE: Both transabdominal and transvaginal ultrasound examinations of the pelvis were performed. Transabdominal technique was performed for global imaging of the pelvis including uterus, ovaries, adnexal regions, and pelvic cul-de-sac. It was necessary to proceed with endovaginal exam following the transabdominal exam to visualize the endometrium and ovaries. COMPARISON:  Ultrasound dated 11/06/2011. FINDINGS: Evaluation is limited due to limited transvaginal exam as the patient was not able to tolerate transvaginal imaging. Uterus Measurements: 12.0 x 6.1 x 10.7 cm = volume: 411 ML. The uterus is poorly visualized. Several fibroids including a 4.8 x 3.7 x 4.4 cm right posterior body intramural fibroid, a 5.3 x 4.8 x 6.0 cm anterior upper body and a 2.8 x 2.7 x 3.1 cm anterior mid body submucosal fibroids. Endometrium The endometrium is poorly visualized and not evaluated. Right ovary Not visualized. Left ovary Not visualized. Other findings No abnormal free fluid. IMPRESSION: 1. Myomatous uterus. 2. Nonvisualization of the endometrium and ovaries. Electronically Signed   By: Elgie Collard M.D.   On: 08/23/2022 20:43         M.D. [facility] 08/25/2022, 2:50 PM  Available via Epic secure chat 7am-7pm After 7 pm, please refer to night coverage provider listed on amion.

## 2022-08-25 NOTE — Plan of Care (Signed)
  Problem: Education: Goal: Knowledge of General Education information will improve Description: Including pain rating scale, medication(s)/side effects and non-pharmacologic comfort measures Outcome: Progressing   Problem: Skin Integrity: Goal: Risk for impaired skin integrity will decrease Outcome: Progressing   Problem: Elimination: Goal: Will not experience complications related to bowel motility Outcome: Not Progressing Note: Pt has had C/O Diarrhea today

## 2022-08-25 NOTE — Progress Notes (Signed)
PT Cancellation Note  Patient Details Name: [name] MRN: [id number] DOB: 1955-04-24   Cancelled Treatment:     PT attempted am and pm but deferred, initially pt bathing, then on bedpan and then to do to CT.  Will follow.   , 08/25/2022, 2:28 PM

## 2022-08-26 ENCOUNTER — Inpatient Hospital Stay (HOSPITAL_COMMUNITY): Payer: Medicare (Managed Care)

## 2022-08-26 DIAGNOSIS — L03116 Cellulitis of left lower limb: Secondary | ICD-10-CM | POA: Diagnosis not present

## 2022-08-26 DIAGNOSIS — K7290 Hepatic failure, unspecified without coma: Secondary | ICD-10-CM | POA: Diagnosis not present

## 2022-08-26 DIAGNOSIS — N179 Acute kidney failure, unspecified: Secondary | ICD-10-CM | POA: Diagnosis not present

## 2022-08-26 DIAGNOSIS — A419 Sepsis, unspecified organism: Secondary | ICD-10-CM | POA: Diagnosis not present

## 2022-08-26 LAB — COMPREHENSIVE METABOLIC PANEL
ALT: 19 U/L (ref 0–44)
AST: 23 U/L (ref 15–41)
Albumin: 2.3 g/dL — ABNORMAL LOW (ref 3.5–5.0)
Alkaline Phosphatase: 120 U/L (ref 38–126)
Anion gap: 7 (ref 5–15)
BUN: 29 mg/dL — ABNORMAL HIGH (ref 8–23)
CO2: 21 mmol/L — ABNORMAL LOW (ref 22–32)
Calcium: 8 mg/dL — ABNORMAL LOW (ref 8.9–10.3)
Chloride: 109 mmol/L (ref 98–111)
Creatinine, Ser: 1.01 mg/dL — ABNORMAL HIGH (ref 0.44–1.00)
GFR, Estimated: >60 mL/min (ref >60)
Glucose, Bld: 151 mg/dL — ABNORMAL HIGH (ref 70–99)
Potassium: 4 mmol/L (ref 3.5–5.1)
Sodium: 137 mmol/L (ref 135–145)
Total Bilirubin: 2.1 mg/dL — ABNORMAL HIGH (ref 0.3–1.2)
Total Protein: 5.1 g/dL — ABNORMAL LOW (ref 6.5–8.1)

## 2022-08-26 LAB — CBC
HCT: 27.8 % — ABNORMAL LOW (ref 36.0–46.0)
Hemoglobin: 8.7 g/dL — ABNORMAL LOW (ref 12.0–15.0)
MCH: 30.6 pg (ref 26.0–34.0)
MCHC: 31.3 g/dL (ref 30.0–36.0)
MCV: 97.9 fL (ref 80.0–100.0)
Platelets: 123 10*3/uL — ABNORMAL LOW (ref 150–400)
RBC: 2.84 MIL/uL — ABNORMAL LOW (ref 3.87–5.11)
RDW: 19.1 % — ABNORMAL HIGH (ref 11.5–15.5)
WBC: 6 10*3/uL (ref 4.0–10.5)
nRBC: 0 % (ref 0.0–0.2)

## 2022-08-26 LAB — MAGNESIUM: Magnesium: 2 mg/dL (ref 1.7–2.4)

## 2022-08-26 LAB — C DIFFICILE (CDIFF) QUICK SCRN (NO PCR REFLEX)
C Diff antigen: NEGATIVE
C Diff interpretation: NOT DETECTED
C Diff toxin: NEGATIVE

## 2022-08-26 LAB — PROTIME-INR
INR: 1.7 — ABNORMAL HIGH (ref 0.8–1.2)
Prothrombin Time: 19.9 s — ABNORMAL HIGH (ref 11.4–15.2)

## 2022-08-26 NOTE — Progress Notes (Signed)
Assessment of PICC line after TPA to lumens 2 and 3 completed. TPA unsuccessful with sluggish blood return on lumen 2 and no blood return on lumen 3. Recommend considering chest xray to verify position. RN notified.

## 2022-08-26 NOTE — Progress Notes (Signed)
[facility]                                                                              [name], is a 67 y.o. female, DOB - 1955/06/27, ZOX:[id number] Admit date - 08/11/2022    Outpatient Primary MD for the patient is [name], MD  LOS - 15  days  Chief Complaint  Patient presents with   Hypotension   Shortness of Breath       Brief summary   Patient is a 67 y/o woman with a history of cirhrosis who presented with fatigue and shortness of breath. Currently being treated for septic shock due to cellulitis with Flavobacterium.  Initially with the ICU.  Treated with pressors.  Transition to midodrine. ID was consulted on 8/4 for flava bacterium bacteremia. Palliative care was consulted on 8/8 for goals of care currently DNR. Transferred to hospitalist service on 8/9. Transferred out of the stepdown on 8/10. IV antibiotic until 8/17. Started to have vaginal bleeding on 8/12.   Assessment & Plan    Principal Problem:  Septic shock (HCC) with AKI, multifactorial Flavobacterium bacteremia, LLE cellulitis and polymicrobial UTI Concern for cat scratch fever -Met criteria on admission with lactic acidosis, encephalopathy, AKI, hypotension, tachycardia and tachypnea -Patient was admitted to ICU, was placed on pressors, fluids and IV albumin -Patient was treated with cefepime and Zithromax (for 5 days to cover cat scratch/Bartonella) -Blood cultures positive for flavobacterium. - ID was consulted, placed on meropenem for 2 weeks, EOT on 08/27/2022  -Patient complaining of abdominal pain, back pain, diarrhea, CT abdomen 8/15 showed pancolonic wall thickening most prominent in ascending and proximal transverse colon suggesting infectious/inflammatory colitis -Follow C. difficile PCR, GI pathogen panel, will reconsult ID given patient is supposed to finish meropenem tomorrow     Decompensated NASH cirrhosis with ascites Coagulopathy, anemia,  thrombocytopenia, anasarca H/o GIB  -Continue rifaximin, lactulose -Currently alert and oriented, outpatient GI follow-up -Lasix and Aldactone on hold due to hypotension.   -Lactulose placed on prn due to diarrhea   AKI with uremia, metabolic acidosis, - no obstruction or hydronephrosis on CT imaging, likely pre-renal. - Treated with IV pressors, IV fluids.  - Renal function has improved -Lasix and Aldactone on hold due to hypotension   Coagulopathy. Postmenopausal vaginal bleeding. Macrocytic anemia with acute on chronic blood loss anemia - Due to cirrhosis her INR has remained elevated. - Patient was started on heparin in the ICU for DVT prophylaxis. -Per son, patient has a history of spontaneous bleeding and concerned about bleeding when PICC line is removed. Had some minor bleeding from her tongue due to tongue bite earlier. -Received Vitamin K x 1 on 8/12, continued for 5 days. - Hold heparin.  CT abdomen did not show any RPH. -Pelvic ultrasound showed myomatous uterus with several fibroids - Dr Allena Katz discussed with hematology and GYN on-call, recommended tranexamic acid twice daily - I d/w Dr Debroah Loop (GYN on-call), on 8/15 recommended Megace 40 mg p.o. twice daily for 2 weeks, will arrange outpatient follow-up at OB/GYN center for further evaluation outpatient. -H&H stable, still has vaginal bleeding  HTN Chronic HFpEF Moderate aortic stenosis -2D echo 06/2022> EF 60-65%, G1DD, mod AS -Due to hypotension, her home medications were on hold.     Physical deconditioning -PT evaluation   Hypothyroidism -Continue synthroid   Depression -Continue effexor    Thoracic spine, T5 wedge deformity -ortho follow-up outpatient. -Has history of chronic back pain   Headache. No focal deficits. Continue Tylenol.   Chronic right eye pain on movement towards medial side. Resolved. No red flag sign.   Chronic left ear pain. Outpatient follow-up with ENT recommended.    Obesity Estimated body mass index is 52.46 kg/m as calculated from the following:   Height as of this encounter: 5\' 2"  (1.575 m).   Weight as of this encounter: 130.1 kg.  Code Status: DNR DVT Prophylaxis:  Place and maintain sequential compression device Start: 08/22/22 1149 SCDs Start: 08/11/22 1108   Level of Care: Level of care: Telemetry Family Communication: Updated patient's son at bedside Disposition Plan:      Remains inpatient appropriate:    Procedures:    Consultants:   CCM, ID, GYN  Antimicrobials:   Anti-infectives (From admission, onward)    Start     Dose/Rate Route Frequency Ordered Stop   08/20/22 1200  meropenem (MERREM) 1 g in sodium chloride 0.9 % 100 mL IVPB        1 g 200 mL/hr over 30 Minutes Intravenous Every 8 hours 08/20/22 1101 08/28/22 0559   08/14/22 1800  azithromycin (ZITHROMAX) 250 mg in dextrose 5 % 125 mL IVPB  Status:  Discontinued        250 mg 127.5 mL/hr over 60 Minutes Intravenous Daily 08/14/22 1738 08/14/22 1752   08/14/22 1800  azithromycin (ZITHROMAX) 500 mg in sodium chloride 0.9 % 250 mL IVPB  Status:  Discontinued        500 mg 250 mL/hr over 60 Minutes Intravenous Daily 08/14/22 1752 08/15/22 1054   08/14/22 1500  meropenem (MERREM) 1 g in sodium chloride 0.9 % 100 mL IVPB  Status:  Discontinued        1 g 200 mL/hr over 30 Minutes Intravenous Every 12 hours 08/14/22 1354 08/20/22 1101   08/13/22 2100  ceFEPIme (MAXIPIME) 2 g in sodium chloride 0.9 % 100 mL IVPB  Status:  Discontinued        2 g 200 mL/hr over 30 Minutes Intravenous Every 24 hours 08/13/22 0436 08/13/22 1944   08/13/22 2100  Cefiderocol Sulfate Tosylate (FETROJA) 1 g in sodium chloride 0.9 % 100 mL IVPB  Status:  Discontinued        1 g 37.1 mL/hr over 3 Hours Intravenous Every 8 hours 08/13/22 1944 08/14/22 1353   08/13/22 0800  vancomycin (VANCOREADY) IVPB 1250 mg/250 mL  Status:  Discontinued        1,250 mg 166.7 mL/hr over 90 Minutes Intravenous  Every 48 hours 08/11/22 1415 08/12/22 0717   08/12/22 2200  ceFEPIme (MAXIPIME) 2 g in sodium chloride 0.9 % 100 mL IVPB  Status:  Discontinued        2 g 200 mL/hr over 30 Minutes Intravenous Every 12 hours 08/12/22 1743 08/13/22 0436   08/12/22 1000  azithromycin (ZITHROMAX) tablet 250 mg  Status:  Discontinued       Placed in "Followed by" Linked Group   250 mg Oral Daily 08/11/22 1353 08/14/22 1738   08/11/22 2000  ceFEPIme (MAXIPIME) 2 g in sodium chloride 0.9 % 100 mL IVPB  Status:  Discontinued  2 g 200 mL/hr over 30 Minutes Intravenous Every 12 hours 08/11/22 1415 08/12/22 1743   08/11/22 1500  rifaximin (XIFAXAN) tablet 550 mg        550 mg Oral 2 times daily 08/11/22 1413     08/11/22 1445  azithromycin (ZITHROMAX) tablet 500 mg       Placed in "Followed by" Linked Group   500 mg Oral  Once 08/11/22 1353 08/11/22 1501   08/11/22 0745  vancomycin (VANCOCIN) IVPB 1000 mg/200 mL premix  Status:  Discontinued        1,000 mg 200 mL/hr over 60 Minutes Intravenous  Once 08/11/22 0739 08/11/22 0740   08/11/22 0745  clindamycin (CLEOCIN) IVPB 600 mg        600 mg 100 mL/hr over 30 Minutes Intravenous  Once 08/11/22 0739 08/11/22 1004   08/11/22 0745  vancomycin (VANCOREADY) IVPB 2000 mg/400 mL        2,000 mg 200 mL/hr over 120 Minutes Intravenous STAT 08/11/22 0740 08/11/22 1030   08/11/22 0745  meropenem (MERREM) 1 g in sodium chloride 0.9 % 100 mL IVPB        1 g 200 mL/hr over 30 Minutes Intravenous STAT 08/11/22 0740 08/11/22 0914          Medications  famotidine  20 mg Oral QAC breakfast   feeding supplement (K[employer] STANDARD 1.4)  325 mL Oral Daily   ketotifen  1 drop Right Eye BID   levothyroxine  150 mcg Oral QAC breakfast   lidocaine  2 patch Transdermal Q24H   magic mouthwash  5 mL Oral QID   megestrol  40 mg Oral BID   midodrine  10 mg Oral TID WC   nystatin cream   Topical BID   pantoprazole  40 mg Oral BID AC   phytonadione  5 mg Oral Daily    rifaximin  550 mg Oral BID   rOPINIRole  0.25 mg Oral QHS   sodium chloride flush  10-40 mL Intracatheter Q12H   tamsulosin  0.4 mg Oral Daily   venlafaxine XR  75 mg Oral Q breakfast   zinc sulfate  220 mg Oral Daily      Subjective:   [name] was seen and examined today.  Still complaining of abdominal pain, back pain, diarrhea, ongoing vaginal bleeding.  She has history of chronic back pain.  Per son at the bedside has used steroids in the past for back pain.    Objective:   Vitals:   08/25/22 1330 08/25/22 2024 08/26/22 0536 08/26/22 1211  BP: (!) 141/75 134/78 131/77 (!) 106/58  Pulse: 88 95 93 71  Resp: 18 20 12 17   Temp:   97.7 F (36.5 C) (!) 97.4 F (36.3 C)  TempSrc:   Oral   SpO2: 96% 95% 95% 97%  Weight:      Height:        Intake/Output Summary (Last 24 hours) at 08/26/2022 1439 Last data filed at 08/25/2022 2006 Gross per 24 hour  Intake 120 ml  Output --  Net 120 ml     Wt Readings from Last 3 Encounters:  08/19/22 130.1 kg  07/29/22 106.6 kg  06/15/22 106.6 kg   Physical Exam General: Alert and oriented x 3, NAD Cardiovascular: S1 S2 clear, RRR.  Respiratory: CTAB, no wheezing Gastrointestinal: Soft, mild TTP, lower abdomen, + distended, NBS Ext: + pedal edema bilaterally Neuro: no new deficits Psych: Normal affect     Data Reviewed:  I have personally reviewed following labs    CBC Lab Results  Component Value Date   WBC 6.0 08/26/2022   RBC 2.84 (L) 08/26/2022   HGB 8.7 (L) 08/26/2022   HCT 27.8 (L) 08/26/2022   MCV 97.9 08/26/2022   MCH 30.6 08/26/2022   PLT 123 (L) 08/26/2022   MCHC 31.3 08/26/2022   RDW 19.1 (H) 08/26/2022   LYMPHSABS 1.6 08/19/2022   MONOABS 0.8 08/19/2022   EOSABS 0.5 08/19/2022   BASOSABS 0.0 08/19/2022     Last metabolic panel Lab Results  Component Value Date   NA 137 08/26/2022   K 4.0 08/26/2022   CL 109 08/26/2022   CO2 21 (L) 08/26/2022   BUN 29 (H) 08/26/2022   CREATININE 1.01 (H)  08/26/2022   GLUCOSE 151 (H) 08/26/2022   GFRNONAA >60 08/26/2022   GFRAA >60 01/19/2019   CALCIUM 8.0 (L) 08/26/2022   PHOS 4.6 08/18/2022   PROT 5.1 (L) 08/26/2022   ALBUMIN 2.3 (L) 08/26/2022   BILITOT 2.1 (H) 08/26/2022   ALKPHOS 120 08/26/2022   AST 23 08/26/2022   ALT 19 08/26/2022   ANIONGAP 7 08/26/2022    CBG (last 3)  No results for input(s): "GLUCAP" in the last 72 hours.    Coagulation Profile: Recent Labs  Lab 08/22/22 0235 08/23/22 0334 08/24/22 0438 08/25/22 0300 08/26/22 0010  INR 1.9* 1.9* 1.6* 1.9* 1.7*     Radiology Studies: I have personally reviewed the imaging studies  DG Chest Port 1 View  Result Date: 08/26/2022 CLINICAL DATA:  Status post central line placement EXAM: PORTABLE CHEST 1 VIEW COMPARISON:  08/12/2022 FINDINGS: Right PICC is again seen in satisfactory position. Cardiac shadow is enlarged. Aortic calcifications are noted. Central vascular congestion is noted without focal infiltrate. IMPRESSION: Right PICC in satisfactory position. Increased central vascular congestion. Electronically Signed   By: Alcide Clever M.D.   On: 08/26/2022 01:20   CT ABDOMEN PELVIS WO CONTRAST  Result Date: 08/25/2022 CLINICAL DATA:  Abdominal pain EXAM: CT ABDOMEN AND PELVIS WITHOUT CONTRAST TECHNIQUE: Multidetector CT imaging of the abdomen and pelvis was performed following the standard protocol without IV contrast. RADIATION DOSE REDUCTION: This exam was performed according to the departmental dose-optimization program which includes automated exposure control, adjustment of the mA and/or kV according to patient size and/or use of iterative reconstruction technique. COMPARISON:  CT abdomen/pelvis dated 08/11/2022 FINDINGS: Lower chest: Small bilateral pleural effusions. Mild bibasilar atelectasis. Hepatobiliary: Cirrhosis. Status post cholecystectomy. No intrahepatic or extrahepatic dilatation. Pancreas: Within normal limits. Spleen: Within the upper limits of  normal for size. Adrenals/Urinary Tract: Adrenal glands are within normal limits. 3 mm nonobstructing right upper pole renal calculus (series 2/image 25). Kidneys are otherwise within normal limits. No hydronephrosis. Bladder is within normal limits. Stomach/Bowel: Stomach is normal limits. No evidence of bowel obstruction. Appendix is not discretely visualized. Pancolonic wall thickening, most prominent in the ascending and proximal transverse colon (series 2/image 41), suggesting infectious/inflammatory colitis. Vascular/Lymphatic: No evidence of abdominal aortic aneurysm. Atherosclerotic calcifications of the abdominal aorta and branch vessels. No suspicious abdominopelvic lymphadenopathy. Reproductive: Calcified uterine fibroids. No adnexal masses. Other: Moderate abdominopelvic ascites, mildly progressive. Small fat/fluid containing left paramidline lower abdominal wall hernia (series 2/image 55), unchanged. Musculoskeletal: Mild degenerative changes of the visualized thoracolumbar spine. IMPRESSION: Suspected infectious/inflammatory pancolitis. Cirrhosis.  Moderate abdominopelvic ascites, mildly progressive. 3 mm nonobstructing right upper pole renal calculus. Small bilateral pleural effusions. Electronically Signed   By: Charline Bills M.D.   On: 08/25/2022  23:12       Winola Drum M.D. [facility] 08/26/2022, 2:39 PM  Available via Epic secure chat 7am-7pm After 7 pm, please refer to night coverage provider listed on amion.

## 2022-08-26 NOTE — Progress Notes (Signed)
Occupational Therapy Treatment Patient Details Name: [name] MRN: [id number] DOB: [birth year] Today's Date: 08/26/2022   History of present illness Pt is 67 yo female admitted on 10/08/1955 with septic shock due to L LE cellulitis and UTI.  Pt with hx including but not limited to NASH cirrhosis, HTN, PE, short gut syndrome, morbid obesity, IDA, pAF, HTN, HLD   OT comments  Pt was assisted into sitting EOB. She gently deferred attempts at out of bed activity, due to fatigue and 8/10 L lower abdomen pain; she attributed pain to new discovery of "fibroids."  Once seated EOB, she was instructed on BUE and BLE therapeutic exercises for strengthening and endurance training needed to facilitate progressive ADL performance. She subsequently performed 10 reps and 1 set of such exercises as over head reaches, kicks, and seated marches. She required assist for B LE, in order perform sit to supine. Continue OT plan of care. HH OT recommended at discharge.       If plan is discharge home, recommend the following:  Help with stairs or ramp for entrance;Assist for transportation;A little help with bathing/dressing/bathroom;A little help with walking and/or transfers   Equipment Recommendations  None recommended by OT    Recommendations for Other Services      Precautions / Restrictions Restrictions Weight Bearing Restrictions: No Other Position/Activity Restrictions: enteric precautions       Mobility Bed Mobility Overal bed mobility: Needs Assistance Bed Mobility: Supine to Sit, Sit to Supine Rolling: Supervision   Supine to sit: Min assist, HOB elevated, Used rails Sit to supine: Mod assist (required assist for BLE)        Transfers        General transfer comment: pt gently deferred, due to fatigue         ADL either performed or assessed with clinical judgement   ADL Overall ADL's : Needs assistance/impaired Eating/Feeding: Independent;Sitting Eating/Feeding Details (indicate  cue type and reason): based on clinical judgement Grooming: Set up;Supervision/safety;Sitting Grooming Details (indicate cue type and reason): simulated seated EOB         Upper Body Dressing : Sitting;Minimal assistance Upper Body Dressing Details (indicate cue type and reason): simulated seated EOB Lower Body Dressing: Moderate assistance;Sit to/from stand                        Cognition Arousal: Alert Behavior During Therapy: WFL for tasks assessed/performed Overall Cognitive Status: Within Functional Limits for tasks assessed                        Pertinent Vitals/ Pain       Pain Assessment Pain Assessment: 0-10 Pain Score: 8  Pain Location: back and L lower abdomen Pain Descriptors / Indicators: Grimacing, Guarding, Aching, Sore Pain Intervention(s): Limited activity within patient's tolerance, Monitored during session, Other (comment) (Nurse in room at start of session and made aware)         Frequency  Min 1X/week        Progress Toward Goals  OT Goals(current goals can now be found in the care plan section)  Progress towards OT goals: Progressing toward goals  Acute Rehab OT Goals Patient Stated Goal: to get better OT Goal Formulation: With patient Time For Goal Achievement: 09/02/22 Potential to Achieve Goals: Good  Plan         AM-PAC OT "6 Clicks" Daily Activity     Outcome Measure   Help from another person  eating meals?: None Help from another person taking care of personal grooming?: A Little Help from another person toileting, which includes using toliet, bedpan, or urinal?: A Lot Help from another person bathing (including washing, rinsing, drying)?: A Lot Help from another person to put on and taking off regular upper body clothing?: A Little Help from another person to put on and taking off regular lower body clothing?: A Lot 6 Click Score: 16    End of Session Equipment Utilized During Treatment: Other (comment)  (N/A)  OT Visit Diagnosis: Pain;Muscle weakness (generalized) (M62.81);Unsteadiness on feet (R26.81)   Activity Tolerance Patient limited by fatigue;Patient limited by pain   Patient Left in bed;with call bell/phone within reach   Nurse Communication Patient requests pain meds        Time: 1500-1516 OT Time Calculation (min): 16 min  Charges: OT General Charges $OT Visit: 1 Visit OT Treatments $Therapeutic Activity: 8-22 mins     Reuben Likes, OTR/L 08/26/2022, 3:25 PM

## 2022-08-26 NOTE — Progress Notes (Signed)
Physical Therapy Treatment Patient Details Name: [name] MRN: [id number] DOB: 1955-08-06 Today's Date: 08/26/2022   History of Present Illness Pt is 67 yo female admitted on 05/04/1955 with septic shock due to L LE cellulitis and UTI.  Pt with hx including but not limited to NASH cirrhosis, HTN, PE, short gut syndrome, morbid obesity, IDA, pAF, HTN, HLD    PT Comments  Pt is progressing well with mobility, she ambulated 45' x 3 with RW seated rest break, SpO2 95% on room air walking, distance limited by fatigue. Son present for PT session, he stated he can provide needed level of assistance at home.     If plan is discharge home, recommend the following: Assistance with cooking/housework;Help with stairs or ramp for entrance;A little help with walking and/or transfers;A little help with bathing/dressing/bathroom   Can travel by private vehicle     Yes  Equipment Recommendations  None recommended by PT    Recommendations for Other Services       Precautions / Restrictions Precautions Precautions: Fall Restrictions Weight Bearing Restrictions: No     Mobility  Bed Mobility   Bed Mobility: Supine to Sit     Supine to sit: Min assist     General bed mobility comments: min A to raise trunk    Transfers Overall transfer level: Needs assistance Equipment used: Rolling walker (2 wheels) Transfers: Sit to/from Stand Sit to Stand: Contact guard assist           General transfer comment: VCs hand placemenct. Sit to stand x 4    Ambulation/Gait Ambulation/Gait assistance: Contact guard assist Gait Distance (Feet): 45 Feet Assistive device: Rolling walker (2 wheels) Gait Pattern/deviations: Step-to pattern, Decreased stride length Gait velocity: WFL     General Gait Details: 21' x 3 with seated rest break, SpO2 95% on room air, distance limtied by fatigue   Stairs             Wheelchair Mobility     Tilt Bed    Modified Rankin (Stroke Patients  Only)       Balance Overall balance assessment: Needs assistance Sitting-balance support: Feet supported, Feet unsupported Sitting balance-Leahy Scale: Fair     Standing balance support: Bilateral upper extremity supported Standing balance-Leahy Scale: Poor                              Cognition Arousal: Alert Behavior During Therapy: WFL for tasks assessed/performed Overall Cognitive Status: Within Functional Limits for tasks assessed                                          Exercises      General Comments        Pertinent Vitals/Pain Pain Assessment Pain Assessment: No/denies pain Pain Score: 0-No pain    Home Living                          Prior Function            PT Goals (current goals can now be found in the care plan section) Acute Rehab PT Goals Patient Stated Goal: To Walk; To decrease pain PT Goal Formulation: With patient/family Time For Goal Achievement: 09/02/22 Potential to Achieve Goals: Good Progress towards PT goals: Progressing toward goals    Frequency  Min 1X/week      PT Plan      Co-evaluation              AM-PAC PT "6 Clicks" Mobility   Outcome Measure  Help needed turning from your back to your side while in a flat bed without using bedrails?: A Little Help needed moving from lying on your back to sitting on the side of a flat bed without using bedrails?: A Little Help needed moving to and from a bed to a chair (including a wheelchair)?: A Little Help needed standing up from a chair using your arms (e.g., wheelchair or bedside chair)?: A Little Help needed to walk in hospital room?: A Little Help needed climbing 3-5 steps with a railing? : A Lot 6 Click Score: 17    End of Session Equipment Utilized During Treatment: Gait belt Activity Tolerance: Patient tolerated treatment well Patient left: in chair;with call bell/phone within reach;with family/visitor present Nurse  Communication: Mobility status PT Visit Diagnosis: Other abnormalities of gait and mobility (R26.89);Muscle weakness (generalized) (M62.81)     Time: 1610-9604 PT Time Calculation (min) (ACUTE ONLY): 19 min  Charges:    $Gait Training: 8-22 mins PT General Charges $$ ACUTE PT VISIT: 1 Visit                     Tamala Ser PT 08/26/2022  Acute Rehabilitation Services  Office [phone]

## 2022-08-27 ENCOUNTER — Inpatient Hospital Stay (HOSPITAL_COMMUNITY): Payer: Medicare (Managed Care)

## 2022-08-27 DIAGNOSIS — A419 Sepsis, unspecified organism: Secondary | ICD-10-CM | POA: Diagnosis not present

## 2022-08-27 DIAGNOSIS — N179 Acute kidney failure, unspecified: Secondary | ICD-10-CM | POA: Diagnosis not present

## 2022-08-27 DIAGNOSIS — I959 Hypotension, unspecified: Secondary | ICD-10-CM | POA: Diagnosis not present

## 2022-08-27 DIAGNOSIS — L03116 Cellulitis of left lower limb: Secondary | ICD-10-CM | POA: Diagnosis not present

## 2022-08-27 LAB — COMPREHENSIVE METABOLIC PANEL
ALT: 18 U/L (ref 0–44)
AST: 20 U/L (ref 15–41)
Albumin: 2.5 g/dL — ABNORMAL LOW (ref 3.5–5.0)
Alkaline Phosphatase: 133 U/L — ABNORMAL HIGH (ref 38–126)
Anion gap: 6 (ref 5–15)
BUN: 31 mg/dL — ABNORMAL HIGH (ref 8–23)
CO2: 24 mmol/L (ref 22–32)
Calcium: 8.2 mg/dL — ABNORMAL LOW (ref 8.9–10.3)
Chloride: 111 mmol/L (ref 98–111)
Creatinine, Ser: 1.07 mg/dL — ABNORMAL HIGH (ref 0.44–1.00)
GFR, Estimated: 58 mL/min — ABNORMAL LOW (ref >60)
Glucose, Bld: 91 mg/dL (ref 70–99)
Potassium: 3.8 mmol/L (ref 3.5–5.1)
Sodium: 141 mmol/L (ref 135–145)
Total Bilirubin: 2.1 mg/dL — ABNORMAL HIGH (ref 0.3–1.2)
Total Protein: 5.2 g/dL — ABNORMAL LOW (ref 6.5–8.1)

## 2022-08-27 LAB — GASTROINTESTINAL PANEL BY PCR, STOOL (REPLACES STOOL CULTURE)

## 2022-08-27 LAB — PROTIME-INR
INR: 1.7 — ABNORMAL HIGH (ref 0.8–1.2)
Prothrombin Time: 20 s — ABNORMAL HIGH (ref 11.4–15.2)

## 2022-08-27 MED ORDER — DICYCLOMINE HCL 10 MG PO CAPS
10.0000 mg | ORAL_CAPSULE | Freq: Three times a day (TID) | ORAL | Status: DC
  Administered 2022-08-27 – 2022-09-09 (×37): 10 mg via ORAL
  Filled 2022-08-27 (×37): qty 1

## 2022-08-27 MED ORDER — LORAZEPAM 2 MG/ML IJ SOLN
1.0000 mg | Freq: Once | INTRAMUSCULAR | Status: AC
  Administered 2022-08-28: 1 mg via INTRAVENOUS
  Filled 2022-08-27: qty 1

## 2022-08-27 NOTE — Progress Notes (Signed)
[facility]                                                                              [name], is a 67 y.o. female, DOB - 1955/05/01, MWU:[id number] Admit date - 08/11/2022    Outpatient Primary MD for the patient is [name], MD  LOS - 16  days  Chief Complaint  Patient presents with   Hypotension   Shortness of Breath       Brief summary   Patient is a 67 y/o woman with a history of cirhrosis who presented with fatigue and shortness of breath. Currently being treated for septic shock due to cellulitis with Flavobacterium.  Initially with the ICU.  Treated with pressors.  Transition to midodrine. ID was consulted on 8/4 for flava bacterium bacteremia. Palliative care was consulted on 8/8 for goals of care currently DNR. Transferred to hospitalist service on 8/9. Transferred out of the stepdown on 8/10. IV antibiotic until 8/17. Started to have vaginal bleeding on 8/12.   Assessment & Plan    Principal Problem:  Septic shock (HCC) with AKI, multifactorial Flavobacterium bacteremia, LLE cellulitis and polymicrobial UTI Concern for cat scratch fever -Met criteria on admission with lactic acidosis, encephalopathy, AKI, hypotension, tachycardia and tachypnea -Patient was admitted to ICU, was placed on pressors, fluids and IV albumin -Patient was treated with cefepime and Zithromax (for 5 days to cover cat scratch/Bartonella) -Blood cultures positive for flavobacterium. - ID was consulted, placed on meropenem for 2 weeks, EOT on 08/27/2022  -CT abdomen 8/15 showed pancolitis -C. difficile PCR negative, GI pathogen panel pending -Continues to complain of intractable back pain, will obtain MRI of the T-spine, L-spine to rule out any discitis, herniated disc    Decompensated NASH cirrhosis with ascites Coagulopathy, anemia, thrombocytopenia, anasarca H/o GIB  -Continue rifaximin, lactulose -Currently alert and oriented, outpatient GI  follow-up -Lasix and Aldactone on hold due to hypotension.   -Lactulose placed on prn due to diarrhea   AKI with uremia, metabolic acidosis, - no obstruction or hydronephrosis on CT imaging, likely pre-renal. - Treated with IV pressors, IV fluids.  - Renal function has improved -Lasix and Aldactone on hold due to hypotension   Coagulopathy. Postmenopausal vaginal bleeding. Macrocytic anemia with acute on chronic blood loss anemia - Due to cirrhosis her INR has remained elevated. - Patient was started on heparin in the ICU for DVT prophylaxis. -Per son, patient has a history of spontaneous bleeding and concerned about bleeding when PICC line is removed. Had some minor bleeding from her tongue due to tongue bite earlier. -Received Vitamin K x 1 on 8/12, continued for 5 days. - Hold heparin.  CT abdomen did not show any RPH. -Pelvic ultrasound showed myomatous uterus with several fibroids - Dr Allena Katz discussed with hematology and GYN on-call, recommended tranexamic acid twice daily, I d/w Dr Debroah Loop (GYN on-call), on 8/15 recommended Megace 40 mg p.o. twice daily for 2 weeks, will arrange outpatient follow-up at OB/GYN center for further evaluation outpatient. -H&H stable, per patient no vaginal bleeding this morning     HTN Chronic HFpEF Moderate aortic stenosis -2D echo 06/2022>  EF 60-65%, G1DD, mod AS -Due to hypotension, her home medications were on hold.     Physical deconditioning -PT evaluation   Hypothyroidism -Continue synthroid   Depression -Continue effexor    Acute on chronic back pain, thoracic spine, T5 wedge deformity -ortho follow-up outpatient. -Follow MRI thoracic spine, lumbar spine   Headache. No focal deficits. Continue Tylenol.   Chronic right eye pain on movement towards medial side. Resolved. No red flag sign.   Chronic left ear pain. Outpatient follow-up with ENT recommended.   Obesity Estimated body mass index is 52.46 kg/m as calculated  from the following:   Height as of this encounter: 5\' 2"  (1.575 m).   Weight as of this encounter: 130.1 kg.  Code Status: DNR DVT Prophylaxis:  Place and maintain sequential compression device Start: 08/22/22 1149 SCDs Start: 08/11/22 1108   Level of Care: Level of care: Telemetry Family Communication: Updated patient's son at bedside 8/16 Disposition Plan:      Remains inpatient appropriate:    Procedures:    Consultants:   CCM, ID, GYN  Antimicrobials:   Anti-infectives (From admission, onward)    Start     Dose/Rate Route Frequency Ordered Stop   08/20/22 1200  meropenem (MERREM) 1 g in sodium chloride 0.9 % 100 mL IVPB  Status:  Discontinued        1 g 200 mL/hr over 30 Minutes Intravenous Every 8 hours 08/20/22 1101 08/26/22 1453   08/14/22 1800  azithromycin (ZITHROMAX) 250 mg in dextrose 5 % 125 mL IVPB  Status:  Discontinued        250 mg 127.5 mL/hr over 60 Minutes Intravenous Daily 08/14/22 1738 08/14/22 1752   08/14/22 1800  azithromycin (ZITHROMAX) 500 mg in sodium chloride 0.9 % 250 mL IVPB  Status:  Discontinued        500 mg 250 mL/hr over 60 Minutes Intravenous Daily 08/14/22 1752 08/15/22 1054   08/14/22 1500  meropenem (MERREM) 1 g in sodium chloride 0.9 % 100 mL IVPB  Status:  Discontinued        1 g 200 mL/hr over 30 Minutes Intravenous Every 12 hours 08/14/22 1354 08/20/22 1101   08/13/22 2100  ceFEPIme (MAXIPIME) 2 g in sodium chloride 0.9 % 100 mL IVPB  Status:  Discontinued        2 g 200 mL/hr over 30 Minutes Intravenous Every 24 hours 08/13/22 0436 08/13/22 1944   08/13/22 2100  Cefiderocol Sulfate Tosylate (FETROJA) 1 g in sodium chloride 0.9 % 100 mL IVPB  Status:  Discontinued        1 g 37.1 mL/hr over 3 Hours Intravenous Every 8 hours 08/13/22 1944 08/14/22 1353   08/13/22 0800  vancomycin (VANCOREADY) IVPB 1250 mg/250 mL  Status:  Discontinued        1,250 mg 166.7 mL/hr over 90 Minutes Intravenous Every 48 hours 08/11/22 1415 08/12/22  0717   08/12/22 2200  ceFEPIme (MAXIPIME) 2 g in sodium chloride 0.9 % 100 mL IVPB  Status:  Discontinued        2 g 200 mL/hr over 30 Minutes Intravenous Every 12 hours 08/12/22 1743 08/13/22 0436   08/12/22 1000  azithromycin (ZITHROMAX) tablet 250 mg  Status:  Discontinued       Placed in "Followed by" Linked Group   250 mg Oral Daily 08/11/22 1353 08/14/22 1738   08/11/22 2000  ceFEPIme (MAXIPIME) 2 g in sodium chloride 0.9 % 100 mL IVPB  Status:  Discontinued  2 g 200 mL/hr over 30 Minutes Intravenous Every 12 hours 08/11/22 1415 08/12/22 1743   08/11/22 1500  rifaximin (XIFAXAN) tablet 550 mg        550 mg Oral 2 times daily 08/11/22 1413     08/11/22 1445  azithromycin (ZITHROMAX) tablet 500 mg       Placed in "Followed by" Linked Group   500 mg Oral  Once 08/11/22 1353 08/11/22 1501   08/11/22 0745  vancomycin (VANCOCIN) IVPB 1000 mg/200 mL premix  Status:  Discontinued        1,000 mg 200 mL/hr over 60 Minutes Intravenous  Once 08/11/22 0739 08/11/22 0740   08/11/22 0745  clindamycin (CLEOCIN) IVPB 600 mg        600 mg 100 mL/hr over 30 Minutes Intravenous  Once 08/11/22 0739 08/11/22 1004   08/11/22 0745  vancomycin (VANCOREADY) IVPB 2000 mg/400 mL        2,000 mg 200 mL/hr over 120 Minutes Intravenous STAT 08/11/22 0740 08/11/22 1030   08/11/22 0745  meropenem (MERREM) 1 g in sodium chloride 0.9 % 100 mL IVPB        1 g 200 mL/hr over 30 Minutes Intravenous STAT 08/11/22 0740 08/11/22 0914          Medications  dicyclomine  10 mg Oral TID AC   famotidine  20 mg Oral QAC breakfast   feeding supplement (KATE [employer]ARMS STANDARD 1.4)  325 mL Oral Daily   ketotifen  1 drop Right Eye BID   levothyroxine  150 mcg Oral QAC breakfast   lidocaine  2 patch Transdermal Q24H   LORazepam  1 mg Intravenous Once   magic mouthwash  5 mL Oral QID   megestrol  40 mg Oral BID   midodrine  10 mg Oral TID WC   nystatin cream   Topical BID   pantoprazole  40 mg Oral BID AC    rifaximin  550 mg Oral BID   rOPINIRole  0.25 mg Oral QHS   sodium chloride flush  10-40 mL Intracatheter Q12H   tamsulosin  0.4 mg Oral Daily   venlafaxine XR  75 mg Oral Q breakfast   zinc sulfate  220 mg Oral Daily      Subjective:   [name] was seen and examined today.  Continues to complain of back pain, Thoracics and lumbar spine area, lower abdomen, states vaginal bleeding improved today.   Objective:   Vitals:   08/26/22 0536 08/26/22 1211 08/26/22 1928 08/27/22 0456  BP: 131/77 (!) 106/58 125/65 136/66  Pulse: 93 71 70 70  Resp: 12 17 20 16   Temp: 97.7 F (36.5 C) (!) 97.4 F (36.3 C) 97.7 F (36.5 C) 97.7 F (36.5 C)  TempSrc: Oral  Oral   SpO2: 95% 97% 100% 99%  Weight:      Height:       No intake or output data in the 24 hours ending 08/27/22 1348    Wt Readings from Last 3 Encounters:  08/19/22 130.1 kg  07/29/22 106.6 kg  06/15/22 106.6 kg    Physical Exam General: Alert and oriented x 3, NAD Cardiovascular: S1 S2 clear, RRR.  Respiratory: CTAB, no wheezing Gastrointestinal: Soft, mild lower TTP, + mild distended, NBS Ext: + pedal edema bilaterally Neuro: no new deficits Psych: Normal affect    Data Reviewed:  I have personally reviewed following labs    CBC Lab Results  Component Value Date   WBC 6.0 08/26/2022  RBC 2.84 (L) 08/26/2022   HGB 8.7 (L) 08/26/2022   HCT 27.8 (L) 08/26/2022   MCV 97.9 08/26/2022   MCH 30.6 08/26/2022   PLT 123 (L) 08/26/2022   MCHC 31.3 08/26/2022   RDW 19.1 (H) 08/26/2022   LYMPHSABS 1.6 08/19/2022   MONOABS 0.8 08/19/2022   EOSABS 0.5 08/19/2022   BASOSABS 0.0 08/19/2022     Last metabolic panel Lab Results  Component Value Date   NA 141 08/27/2022   K 3.8 08/27/2022   CL 111 08/27/2022   CO2 24 08/27/2022   BUN 31 (H) 08/27/2022   CREATININE 1.07 (H) 08/27/2022   GLUCOSE 91 08/27/2022   GFRNONAA 58 (L) 08/27/2022   GFRAA >60 01/19/2019   CALCIUM 8.2 (L) 08/27/2022   PHOS 4.6  08/18/2022   PROT 5.2 (L) 08/27/2022   ALBUMIN 2.5 (L) 08/27/2022   BILITOT 2.1 (H) 08/27/2022   ALKPHOS 133 (H) 08/27/2022   AST 20 08/27/2022   ALT 18 08/27/2022   ANIONGAP 6 08/27/2022    CBG (last 3)  No results for input(s): "GLUCAP" in the last 72 hours.    Coagulation Profile: Recent Labs  Lab 08/23/22 0334 08/24/22 0438 08/25/22 0300 08/26/22 0010 08/27/22 0355  INR 1.9* 1.6* 1.9* 1.7* 1.7*     Radiology Studies: I have personally reviewed the imaging studies  DG Abd Portable 1V  Result Date: 08/27/2022 CLINICAL DATA:  Abdominal pain EXAM: PORTABLE ABDOMEN - 1 VIEW COMPARISON:  08/15/2022 abdominal radiograph FINDINGS: Cholecystectomy clips are seen in the right upper quadrant of the abdomen. Bulky calcified central pelvic masses up to 6.7 cm compatible with degenerated fibroids as seen on recent CT. Gas-filled mildly dilated small bowel loops throughout the central abdomen up to 3.8 cm diameter. Moderate colonic gas most prominent in the transverse colon. No evidence of pneumatosis or pneumoperitoneum. IMPRESSION: Gas-filled mildly dilated small bowel loops throughout the central abdomen. Moderate colonic gas most prominent in the transverse colon. Findings favor mild adynamic ileus. Electronically Signed   By: Delbert Phenix M.D.   On: 08/27/2022 13:14   DG Chest Port 1 View  Result Date: 08/26/2022 CLINICAL DATA:  Status post central line placement EXAM: PORTABLE CHEST 1 VIEW COMPARISON:  08/12/2022 FINDINGS: Right PICC is again seen in satisfactory position. Cardiac shadow is enlarged. Aortic calcifications are noted. Central vascular congestion is noted without focal infiltrate. IMPRESSION: Right PICC in satisfactory position. Increased central vascular congestion. Electronically Signed   By: Alcide Clever M.D.   On: 08/26/2022 01:20   CT ABDOMEN PELVIS WO CONTRAST  Result Date: 08/25/2022 CLINICAL DATA:  Abdominal pain EXAM: CT ABDOMEN AND PELVIS WITHOUT CONTRAST  TECHNIQUE: Multidetector CT imaging of the abdomen and pelvis was performed following the standard protocol without IV contrast. RADIATION DOSE REDUCTION: This exam was performed according to the departmental dose-optimization program which includes automated exposure control, adjustment of the mA and/or kV according to patient size and/or use of iterative reconstruction technique. COMPARISON:  CT abdomen/pelvis dated 08/11/2022 FINDINGS: Lower chest: Small bilateral pleural effusions. Mild bibasilar atelectasis. Hepatobiliary: Cirrhosis. Status post cholecystectomy. No intrahepatic or extrahepatic dilatation. Pancreas: Within normal limits. Spleen: Within the upper limits of normal for size. Adrenals/Urinary Tract: Adrenal glands are within normal limits. 3 mm nonobstructing right upper pole renal calculus (series 2/image 25). Kidneys are otherwise within normal limits. No hydronephrosis. Bladder is within normal limits. Stomach/Bowel: Stomach is normal limits. No evidence of bowel obstruction. Appendix is not discretely visualized. Pancolonic wall thickening, most prominent in  the ascending and proximal transverse colon (series 2/image 41), suggesting infectious/inflammatory colitis. Vascular/Lymphatic: No evidence of abdominal aortic aneurysm. Atherosclerotic calcifications of the abdominal aorta and branch vessels. No suspicious abdominopelvic lymphadenopathy. Reproductive: Calcified uterine fibroids. No adnexal masses. Other: Moderate abdominopelvic ascites, mildly progressive. Small fat/fluid containing left paramidline lower abdominal wall hernia (series 2/image 55), unchanged. Musculoskeletal: Mild degenerative changes of the visualized thoracolumbar spine. IMPRESSION: Suspected infectious/inflammatory pancolitis. Cirrhosis.  Moderate abdominopelvic ascites, mildly progressive. 3 mm nonobstructing right upper pole renal calculus. Small bilateral pleural effusions. Electronically Signed   By: Charline Bills M.D.   On: 08/25/2022 23:12       Kenden Brandt M.D. [facility] 08/27/2022, 1:48 PM  Available via Epic secure chat 7am-7pm After 7 pm, please refer to night coverage provider listed on amion.

## 2022-08-27 NOTE — Progress Notes (Signed)
Id brief note  Patient previously seen by id Finished meropenem for flavum bacterium bsi/cellulitis and question of catscratch disease s/p azith as well  No sign of sepsis  Abd ct for her abd pain by primary team w/u showed "colitis" in setting cirrosis  Gi pcr/cdiff screen negative   -finished meropenem stopped 8/16 -supportive care by primary team -no further active id issue -discussed with team

## 2022-08-27 NOTE — Plan of Care (Signed)

## 2022-08-28 ENCOUNTER — Inpatient Hospital Stay (HOSPITAL_COMMUNITY): Payer: Medicare (Managed Care)

## 2022-08-28 DIAGNOSIS — L03116 Cellulitis of left lower limb: Secondary | ICD-10-CM | POA: Diagnosis not present

## 2022-08-28 DIAGNOSIS — K7290 Hepatic failure, unspecified without coma: Secondary | ICD-10-CM | POA: Diagnosis not present

## 2022-08-28 DIAGNOSIS — A419 Sepsis, unspecified organism: Secondary | ICD-10-CM | POA: Diagnosis not present

## 2022-08-28 DIAGNOSIS — N179 Acute kidney failure, unspecified: Secondary | ICD-10-CM | POA: Diagnosis not present

## 2022-08-28 LAB — PROTIME-INR
INR: 1.6 — ABNORMAL HIGH (ref 0.8–1.2)
Prothrombin Time: 19.1 s — ABNORMAL HIGH (ref 11.4–15.2)

## 2022-08-28 LAB — COMPREHENSIVE METABOLIC PANEL
ALT: 18 U/L (ref 0–44)
AST: 25 U/L (ref 15–41)
Albumin: 2.5 g/dL — ABNORMAL LOW (ref 3.5–5.0)
Alkaline Phosphatase: 144 U/L — ABNORMAL HIGH (ref 38–126)
Anion gap: 5 (ref 5–15)
BUN: 30 mg/dL — ABNORMAL HIGH (ref 8–23)
CO2: 24 mmol/L (ref 22–32)
Calcium: 8.1 mg/dL — ABNORMAL LOW (ref 8.9–10.3)
Chloride: 112 mmol/L — ABNORMAL HIGH (ref 98–111)
Creatinine, Ser: 0.97 mg/dL (ref 0.44–1.00)
GFR, Estimated: >60 mL/min (ref >60)
Glucose, Bld: 96 mg/dL (ref 70–99)
Potassium: 3.8 mmol/L (ref 3.5–5.1)
Sodium: 141 mmol/L (ref 135–145)
Total Bilirubin: 2.2 mg/dL — ABNORMAL HIGH (ref 0.3–1.2)
Total Protein: 5.4 g/dL — ABNORMAL LOW (ref 6.5–8.1)

## 2022-08-28 NOTE — Plan of Care (Signed)

## 2022-08-28 NOTE — Plan of Care (Signed)
  Problem: Nutrition: Goal: Adequate nutrition will be maintained Outcome: Progressing   Problem: Elimination: Goal: Will not experience complications related to bowel motility Outcome: Progressing   Problem: Safety: Goal: Ability to remain free from injury will improve Outcome: Progressing   Problem: Skin Integrity: Goal: Risk for impaired skin integrity will decrease Outcome: Progressing   

## 2022-08-28 NOTE — Progress Notes (Signed)
[facility]                                                                              [name], is a 67 y.o. female, DOB - [birth year], ZOX:[id number] Admit date - 08/11/2022    Outpatient Primary MD for the patient is Kara Chessman, MD  LOS - 17  days  Chief Complaint  Patient presents with   Hypotension   Shortness of Breath       Brief summary   Patient is a 67 y/o woman with a history of cirhrosis who presented with fatigue and shortness of breath. Currently being treated for septic shock due to cellulitis with Flavobacterium.  Initially with the ICU.  Treated with pressors.  Transition to midodrine. ID was consulted on 8/4 for flava bacterium bacteremia. Palliative care was consulted on 8/8 for goals of care currently DNR. Transferred to hospitalist service on 8/9. Transferred out of the stepdown on 8/10. IV antibiotic until 8/17. Started to have vaginal bleeding on 8/12.   Assessment & Plan    Principal Problem:  Septic shock (HCC) with AKI, multifactorial Flavobacterium bacteremia, LLE cellulitis and polymicrobial UTI Concern for cat scratch fever -Met criteria on admission with lactic acidosis, encephalopathy, AKI, hypotension, tachycardia and tachypnea -Patient was admitted to ICU, was placed on pressors, fluids and IV albumin -Patient was treated with cefepime and Zithromax (for 5 days to cover cat scratch/Bartonella) -Blood cultures positive for flavobacterium. - ID was consulted, placed on meropenem for 2 weeks, completed on 08/26/2022 -CT abdomen 8/15 showed pancolitis -C. difficile PCR negative, GI pathogen panel negative -Due to concern for acute on chronic back pain, MRI T-spine, L-spine pending    Decompensated NASH cirrhosis with ascites Coagulopathy, anemia, thrombocytopenia, anasarca H/o GIB  -Continue rifaximin, lactulose -Currently alert and oriented, outpatient GI follow-up -Lasix and Aldactone on hold due to  hypotension.   -Lactulose placed on prn due to diarrhea   AKI with uremia, metabolic acidosis, - no obstruction or hydronephrosis on CT imaging, likely pre-renal. - Treated with IV pressors, IV fluids.  - Renal function has improved -Lasix and Aldactone on hold due to hypotension   Coagulopathy. Postmenopausal vaginal bleeding. Macrocytic anemia with acute on chronic blood loss anemia - Due to cirrhosis her INR has remained elevated. - Patient was started on heparin in the ICU for DVT prophylaxis. -Per son, patient has a history of spontaneous bleeding and concerned about bleeding when PICC line is removed. Had some minor bleeding from her tongue due to tongue bite earlier. -Received Vitamin K x 1 on 8/12, continued for 5 days. - Hold heparin.  CT abdomen did not show any RPH. -Pelvic ultrasound showed myomatous uterus with several fibroids - Dr Allena Katz discussed with hematology and GYN on-call, recommended tranexamic acid twice daily, I d/w Dr Debroah Loop (GYN on-call), on 8/15 recommended Megace 40 mg p.o. twice daily for 2 weeks, will arrange outpatient follow-up at OB/GYN center for further evaluation outpatient. -Per patient had slight vaginal bleeding yesterday, none today     HTN Chronic HFpEF Moderate aortic stenosis -2D echo 06/2022> EF 60-65%, G1DD, mod AS -Due to hypotension, her  home medications were on hold.     Physical deconditioning -PT evaluation   Hypothyroidism -Continue synthroid   Depression -Continue effexor    Acute on chronic back pain, thoracic spine, T5 wedge deformity -ortho follow-up outpatient. -Follow MRI thoracic spine, lumbar spine   Headache. No focal deficits. Continue Tylenol.   Chronic right eye pain on movement towards medial side. Resolved. No red flag sign.   Chronic left ear pain. Outpatient follow-up with ENT recommended.   Obesity Estimated body mass index is 52.46 kg/m as calculated from the following:   Height as of this  encounter: 5\' 2"  (1.575 m).   Weight as of this encounter: 130.1 kg.  Code Status: DNR DVT Prophylaxis:  Place and maintain sequential compression device Start: 08/22/22 1149 SCDs Start: 08/11/22 1108   Level of Care: Level of care: Telemetry Family Communication: Updated patient's son at bedside 8/16 Disposition Plan:      Remains inpatient appropriate:    Procedures:    Consultants:   CCM, ID, GYN  Antimicrobials:   Anti-infectives (From admission, onward)    Start     Dose/Rate Route Frequency Ordered Stop   08/20/22 1200  meropenem (MERREM) 1 g in sodium chloride 0.9 % 100 mL IVPB  Status:  Discontinued        1 g 200 mL/hr over 30 Minutes Intravenous Every 8 hours 08/20/22 1101 08/26/22 1453   08/14/22 1800  azithromycin (ZITHROMAX) 250 mg in dextrose 5 % 125 mL IVPB  Status:  Discontinued        250 mg 127.5 mL/hr over 60 Minutes Intravenous Daily 08/14/22 1738 08/14/22 1752   08/14/22 1800  azithromycin (ZITHROMAX) 500 mg in sodium chloride 0.9 % 250 mL IVPB  Status:  Discontinued        500 mg 250 mL/hr over 60 Minutes Intravenous Daily 08/14/22 1752 08/15/22 1054   08/14/22 1500  meropenem (MERREM) 1 g in sodium chloride 0.9 % 100 mL IVPB  Status:  Discontinued        1 g 200 mL/hr over 30 Minutes Intravenous Every 12 hours 08/14/22 1354 08/20/22 1101   08/13/22 2100  ceFEPIme (MAXIPIME) 2 g in sodium chloride 0.9 % 100 mL IVPB  Status:  Discontinued        2 g 200 mL/hr over 30 Minutes Intravenous Every 24 hours 08/13/22 0436 08/13/22 1944   08/13/22 2100  Cefiderocol Sulfate Tosylate (FETROJA) 1 g in sodium chloride 0.9 % 100 mL IVPB  Status:  Discontinued        1 g 37.1 mL/hr over 3 Hours Intravenous Every 8 hours 08/13/22 1944 08/14/22 1353   08/13/22 0800  vancomycin (VANCOREADY) IVPB 1250 mg/250 mL  Status:  Discontinued        1,250 mg 166.7 mL/hr over 90 Minutes Intravenous Every 48 hours 08/11/22 1415 08/12/22 0717   08/12/22 2200  ceFEPIme (MAXIPIME) 2  g in sodium chloride 0.9 % 100 mL IVPB  Status:  Discontinued        2 g 200 mL/hr over 30 Minutes Intravenous Every 12 hours 08/12/22 1743 08/13/22 0436   08/12/22 1000  azithromycin (ZITHROMAX) tablet 250 mg  Status:  Discontinued       Placed in "Followed by" Linked Group   250 mg Oral Daily 08/11/22 1353 08/14/22 1738   08/11/22 2000  ceFEPIme (MAXIPIME) 2 g in sodium chloride 0.9 % 100 mL IVPB  Status:  Discontinued        2 g 200  mL/hr over 30 Minutes Intravenous Every 12 hours 08/11/22 1415 08/12/22 1743   08/11/22 1500  rifaximin (XIFAXAN) tablet 550 mg        550 mg Oral 2 times daily 08/11/22 1413     08/11/22 1445  azithromycin (ZITHROMAX) tablet 500 mg       Placed in "Followed by" Linked Group   500 mg Oral  Once 08/11/22 1353 08/11/22 1501   08/11/22 0745  vancomycin (VANCOCIN) IVPB 1000 mg/200 mL premix  Status:  Discontinued        1,000 mg 200 mL/hr over 60 Minutes Intravenous  Once 08/11/22 0739 08/11/22 0740   08/11/22 0745  clindamycin (CLEOCIN) IVPB 600 mg        600 mg 100 mL/hr over 30 Minutes Intravenous  Once 08/11/22 0739 08/11/22 1004   08/11/22 0745  vancomycin (VANCOREADY) IVPB 2000 mg/400 mL        2,000 mg 200 mL/hr over 120 Minutes Intravenous STAT 08/11/22 0740 08/11/22 1030   08/11/22 0745  meropenem (MERREM) 1 g in sodium chloride 0.9 % 100 mL IVPB        1 g 200 mL/hr over 30 Minutes Intravenous STAT 08/11/22 0740 08/11/22 0914          Medications  dicyclomine  10 mg Oral TID AC   famotidine  20 mg Oral QAC breakfast   feeding supplement (K[employer] STANDARD 1.4)  325 mL Oral Daily   ketotifen  1 drop Right Eye BID   levothyroxine  150 mcg Oral QAC breakfast   lidocaine  2 patch Transdermal Q24H   magic mouthwash  5 mL Oral QID   megestrol  40 mg Oral BID   midodrine  10 mg Oral TID WC   nystatin cream   Topical BID   pantoprazole  40 mg Oral BID AC   rifaximin  550 mg Oral BID   rOPINIRole  0.25 mg Oral QHS   sodium chloride flush   10-40 mL Intracatheter Q12H   tamsulosin  0.4 mg Oral Daily   venlafaxine XR  75 mg Oral Q breakfast   zinc sulfate  220 mg Oral Daily      Subjective:   [name] was seen and examined today.  Vaginal bleeding improved, continues to have back pain but better today.  No nausea vomiting, chest pain or shortness of breath.  Objective:   Vitals:   08/27/22 1621 08/27/22 1939 08/28/22 0518 08/28/22 1413  BP: (!) 106/58 (!) 119/50 139/87 122/66  Pulse: 83 69 87 91  Resp: 20 16 17 20   Temp: 97.6 F (36.4 C) 97.8 F (36.6 C) 97.9 F (36.6 C) 98.4 F (36.9 C)  TempSrc: Oral Oral    SpO2: 96% 95% 97% 93%  Weight:      Height:        Intake/Output Summary (Last 24 hours) at 08/28/2022 1513 Last data filed at 08/28/2022 0300 Gross per 24 hour  Intake 498 ml  Output --  Net 498 ml      Wt Readings from Last 3 Encounters:  08/19/22 130.1 kg  07/29/22 106.6 kg  06/15/22 106.6 kg   Physical Exam General: Alert and oriented x 3, NAD Cardiovascular: S1 S2 clear, RRR.  Respiratory: CTAB, no wheezing Gastrointestinal: Soft, mild diffuse TTP, non distended, NBS Ext: + pedal edema bilaterally Neuro: no new deficits Psych: Normal affect, pleasant  Data Reviewed:  I have personally reviewed following labs    CBC Lab Results  Component  Value Date   WBC 6.0 08/26/2022   RBC 2.84 (L) 08/26/2022   HGB 8.7 (L) 08/26/2022   HCT 27.8 (L) 08/26/2022   MCV 97.9 08/26/2022   MCH 30.6 08/26/2022   PLT 123 (L) 08/26/2022   MCHC 31.3 08/26/2022   RDW 19.1 (H) 08/26/2022   LYMPHSABS 1.6 08/19/2022   MONOABS 0.8 08/19/2022   EOSABS 0.5 08/19/2022   BASOSABS 0.0 08/19/2022     Last metabolic panel Lab Results  Component Value Date   NA 141 08/28/2022   K 3.8 08/28/2022   CL 112 (H) 08/28/2022   CO2 24 08/28/2022   BUN 30 (H) 08/28/2022   CREATININE 0.97 08/28/2022   GLUCOSE 96 08/28/2022   GFRNONAA >60 08/28/2022   GFRAA >60 01/19/2019   CALCIUM 8.1 (L) 08/28/2022    PHOS 4.6 08/18/2022   PROT 5.4 (L) 08/28/2022   ALBUMIN 2.5 (L) 08/28/2022   BILITOT 2.2 (H) 08/28/2022   ALKPHOS 144 (H) 08/28/2022   AST 25 08/28/2022   ALT 18 08/28/2022   ANIONGAP 5 08/28/2022    CBG (last 3)  No results for input(s): "GLUCAP" in the last 72 hours.    Coagulation Profile: Recent Labs  Lab 08/24/22 0438 08/25/22 0300 08/26/22 0010 08/27/22 0355 08/28/22 0408  INR 1.6* 1.9* 1.7* 1.7* 1.6*     Radiology Studies: I have personally reviewed the imaging studies  DG Abd Portable 1V  Result Date: 08/27/2022 CLINICAL DATA:  Abdominal pain EXAM: PORTABLE ABDOMEN - 1 VIEW COMPARISON:  08/15/2022 abdominal radiograph FINDINGS: Cholecystectomy clips are seen in the right upper quadrant of the abdomen. Bulky calcified central pelvic masses up to 6.7 cm compatible with degenerated fibroids as seen on recent CT. Gas-filled mildly dilated small bowel loops throughout the central abdomen up to 3.8 cm diameter. Moderate colonic gas most prominent in the transverse colon. No evidence of pneumatosis or pneumoperitoneum. IMPRESSION: Gas-filled mildly dilated small bowel loops throughout the central abdomen. Moderate colonic gas most prominent in the transverse colon. Findings favor mild adynamic ileus. Electronically Signed   By: Delbert Phenix M.D.   On: 08/27/2022 13:14       Royden Bulman M.D. [facility] 08/28/2022, 3:13 PM  Available via Epic secure chat 7am-7pm After 7 pm, please refer to night coverage provider listed on amion.

## 2022-08-29 ENCOUNTER — Inpatient Hospital Stay (HOSPITAL_COMMUNITY): Payer: Medicare (Managed Care)

## 2022-08-29 DIAGNOSIS — A419 Sepsis, unspecified organism: Secondary | ICD-10-CM | POA: Diagnosis not present

## 2022-08-29 DIAGNOSIS — L03116 Cellulitis of left lower limb: Secondary | ICD-10-CM | POA: Diagnosis not present

## 2022-08-29 DIAGNOSIS — K7290 Hepatic failure, unspecified without coma: Secondary | ICD-10-CM | POA: Diagnosis not present

## 2022-08-29 DIAGNOSIS — N179 Acute kidney failure, unspecified: Secondary | ICD-10-CM | POA: Diagnosis not present

## 2022-08-29 LAB — COMPREHENSIVE METABOLIC PANEL
ALT: 18 U/L (ref 0–44)
AST: 26 U/L (ref 15–41)
Albumin: 2.4 g/dL — ABNORMAL LOW (ref 3.5–5.0)
Alkaline Phosphatase: 145 U/L — ABNORMAL HIGH (ref 38–126)
Anion gap: 5 (ref 5–15)
BUN: 29 mg/dL — ABNORMAL HIGH (ref 8–23)
CO2: 24 mmol/L (ref 22–32)
Calcium: 8 mg/dL — ABNORMAL LOW (ref 8.9–10.3)
Chloride: 111 mmol/L (ref 98–111)
Creatinine, Ser: 0.99 mg/dL (ref 0.44–1.00)
GFR, Estimated: >60 mL/min (ref >60)
Glucose, Bld: 99 mg/dL (ref 70–99)
Potassium: 3.9 mmol/L (ref 3.5–5.1)
Sodium: 140 mmol/L (ref 135–145)
Total Bilirubin: 2.2 mg/dL — ABNORMAL HIGH (ref 0.3–1.2)
Total Protein: 5.2 g/dL — ABNORMAL LOW (ref 6.5–8.1)

## 2022-08-29 LAB — CBC
HCT: 26.4 % — ABNORMAL LOW (ref 36.0–46.0)
Hemoglobin: 8 g/dL — ABNORMAL LOW (ref 12.0–15.0)
MCH: 29.5 pg (ref 26.0–34.0)
MCHC: 30.3 g/dL (ref 30.0–36.0)
MCV: 97.4 fL (ref 80.0–100.0)
Platelets: 98 10*3/uL — ABNORMAL LOW (ref 150–400)
RBC: 2.71 MIL/uL — ABNORMAL LOW (ref 3.87–5.11)
RDW: 18.6 % — ABNORMAL HIGH (ref 11.5–15.5)
WBC: 5.5 10*3/uL (ref 4.0–10.5)
nRBC: 0 % (ref 0.0–0.2)

## 2022-08-29 LAB — PROTIME-INR
INR: 1.6 — ABNORMAL HIGH (ref 0.8–1.2)
Prothrombin Time: 19.5 s — ABNORMAL HIGH (ref 11.4–15.2)

## 2022-08-29 MED ORDER — FUROSEMIDE 20 MG PO TABS
20.0000 mg | ORAL_TABLET | Freq: Every day | ORAL | Status: DC
  Administered 2022-08-29 – 2022-08-30 (×2): 20 mg via ORAL
  Filled 2022-08-29 (×2): qty 1

## 2022-08-29 MED ORDER — SPIRONOLACTONE 25 MG PO TABS
25.0000 mg | ORAL_TABLET | Freq: Every day | ORAL | Status: DC
  Administered 2022-08-29 – 2022-09-09 (×11): 25 mg via ORAL
  Filled 2022-08-29 (×11): qty 1

## 2022-08-29 NOTE — Progress Notes (Signed)
[facility]                                                                              [name], is a 67 y.o. female, DOB - March 15, 1955, EVO:[id number] Admit date - 08/11/2022    Outpatient Primary MD for the patient is [name], MD  LOS - 18  days  Chief Complaint  Patient presents with   Hypotension   Shortness of Breath       Brief summary   Patient is a 67 y/o woman with a history of cirhrosis who presented with fatigue and shortness of breath. Currently being treated for septic shock due to cellulitis with Flavobacterium.  Initially with the ICU.  Treated with pressors.  Transition to midodrine. ID was consulted on 8/4 for flava bacterium bacteremia. Palliative care was consulted on 8/8 for goals of care currently DNR. Transferred to hospitalist service on 8/9. Transferred out of the stepdown on 8/10. IV antibiotic until 8/17. Started to have vaginal bleeding on 8/12.   Assessment & Plan    Principal Problem:  Septic shock (HCC) with AKI, multifactorial Flavobacterium bacteremia, LLE cellulitis and polymicrobial UTI Concern for cat scratch fever -Met criteria on admission with lactic acidosis, encephalopathy, AKI, hypotension, tachycardia and tachypnea -Patient was admitted to ICU, was placed on pressors, fluids and IV albumin -Patient was treated with cefepime and Zithromax (for 5 days to cover cat scratch/Bartonella) -Blood cultures positive for flavobacterium. - ID was consulted, placed on meropenem for 2 weeks, completed on 08/26/2022.  Discussed with ID, does not need any further antibiotics. -CT abdomen 8/15 showed pancolitis -C. difficile PCR negative, GI pathogen panel negative  Active problems Acute on chronic back pain -Due to bacteremia, acute on chronic back pain, MRI T-spine, L-spine was obtained -MRI showed acute compression fractures of T3 and T5 vertebral bodies with marrow edema, moderate height loss at T3, mild to  moderate height loss at T5.  No discitis or osteomyelitis -Discussed in detail with the patient regarding conservative management versus kyphoplasty. -Patient wants to pursue kyphoplasty, IR consulted.   Decompensated NASH cirrhosis with ascites Coagulopathy, anemia, thrombocytopenia, anasarca H/o GIB  -Continue rifaximin, lactulose -Currently alert and oriented, outpatient GI follow-up -Lasix and Aldactone was placed on hold due to hypotension, BP now improved -Resumed Lasix 20 mg daily, Aldactone 25 mg daily, -Lactulose placed on prn due to diarrhea   AKI with uremia, metabolic acidosis, - no obstruction or hydronephrosis on CT imaging, likely pre-renal. - Treated with IV pressors, IV fluids.  - Renal function has improved   Coagulopathy. Postmenopausal vaginal bleeding. Macrocytic anemia with acute on chronic blood loss anemia - Due to cirrhosis her INR has remained elevated. - Patient was started on heparin in the ICU for DVT prophylaxis. -Received Vitamin K x 1 on 8/12 x 5 days. -Heparin held.  CT abdomen did not show any RPH. -Pelvic ultrasound showed myomatous uterus with several fibroids - Dr Allena Katz discussed with hematology and GYN on-call, received tranexamic acid twice daily, I d/w Dr Debroah Loop (GYN on-call), on 8/15 recommended Megace 40 mg p.o. twice daily for 2 weeks, will arrange outpatient follow-up  at OB/GYN center for further evaluation outpatient. -Vaginal bleeding has resolved     HTN Chronic HFpEF Moderate aortic stenosis -2D echo 06/2022> EF 60-65%, G1DD, mod AS -BP now stable, I's and O's with 4.2 L positive, will resume Lasix and Aldactone     Physical deconditioning -PT evaluation   Hypothyroidism -Continue synthroid   Depression -Continue effexor     Headache. No focal deficits. Continue Tylenol.   Chronic right eye pain on movement towards medial side. Resolved. No red flag sign.   Chronic left ear pain. Outpatient follow-up with ENT  recommended.   Obesity Estimated body mass index is 52.46 kg/m as calculated from the following:   Height as of this encounter: 5\' 2"  (1.575 m).   Weight as of this encounter: 130.1 kg.  Code Status: DNR DVT Prophylaxis:  Place and maintain sequential compression device Start: 08/22/22 1149 SCDs Start: 08/11/22 1108   Level of Care: Level of care: Telemetry Family Communication: Updated patient's son at bedside 8/16 Disposition Plan:      Remains inpatient appropriate:    Procedures:    Consultants:   CCM, ID, GYN  Antimicrobials:   Anti-infectives (From admission, onward)    Start     Dose/Rate Route Frequency Ordered Stop   08/20/22 1200  meropenem (MERREM) 1 g in sodium chloride 0.9 % 100 mL IVPB  Status:  Discontinued        1 g 200 mL/hr over 30 Minutes Intravenous Every 8 hours 08/20/22 1101 08/26/22 1453   08/14/22 1800  azithromycin (ZITHROMAX) 250 mg in dextrose 5 % 125 mL IVPB  Status:  Discontinued        250 mg 127.5 mL/hr over 60 Minutes Intravenous Daily 08/14/22 1738 08/14/22 1752   08/14/22 1800  azithromycin (ZITHROMAX) 500 mg in sodium chloride 0.9 % 250 mL IVPB  Status:  Discontinued        500 mg 250 mL/hr over 60 Minutes Intravenous Daily 08/14/22 1752 08/15/22 1054   08/14/22 1500  meropenem (MERREM) 1 g in sodium chloride 0.9 % 100 mL IVPB  Status:  Discontinued        1 g 200 mL/hr over 30 Minutes Intravenous Every 12 hours 08/14/22 1354 08/20/22 1101   08/13/22 2100  ceFEPIme (MAXIPIME) 2 g in sodium chloride 0.9 % 100 mL IVPB  Status:  Discontinued        2 g 200 mL/hr over 30 Minutes Intravenous Every 24 hours 08/13/22 0436 08/13/22 1944   08/13/22 2100  Cefiderocol Sulfate Tosylate (FETROJA) 1 g in sodium chloride 0.9 % 100 mL IVPB  Status:  Discontinued        1 g 37.1 mL/hr over 3 Hours Intravenous Every 8 hours 08/13/22 1944 08/14/22 1353   08/13/22 0800  vancomycin (VANCOREADY) IVPB 1250 mg/250 mL  Status:  Discontinued        1,250  mg 166.7 mL/hr over 90 Minutes Intravenous Every 48 hours 08/11/22 1415 08/12/22 0717   08/12/22 2200  ceFEPIme (MAXIPIME) 2 g in sodium chloride 0.9 % 100 mL IVPB  Status:  Discontinued        2 g 200 mL/hr over 30 Minutes Intravenous Every 12 hours 08/12/22 1743 08/13/22 0436   08/12/22 1000  azithromycin (ZITHROMAX) tablet 250 mg  Status:  Discontinued       Placed in "Followed by" Linked Group   250 mg Oral Daily 08/11/22 1353 08/14/22 1738   08/11/22 2000  ceFEPIme (MAXIPIME) 2 g in sodium chloride  0.9 % 100 mL IVPB  Status:  Discontinued        2 g 200 mL/hr over 30 Minutes Intravenous Every 12 hours 08/11/22 1415 08/12/22 1743   08/11/22 1500  rifaximin (XIFAXAN) tablet 550 mg        550 mg Oral 2 times daily 08/11/22 1413     08/11/22 1445  azithromycin (ZITHROMAX) tablet 500 mg       Placed in "Followed by" Linked Group   500 mg Oral  Once 08/11/22 1353 08/11/22 1501   08/11/22 0745  vancomycin (VANCOCIN) IVPB 1000 mg/200 mL premix  Status:  Discontinued        1,000 mg 200 mL/hr over 60 Minutes Intravenous  Once 08/11/22 0739 08/11/22 0740   08/11/22 0745  clindamycin (CLEOCIN) IVPB 600 mg        600 mg 100 mL/hr over 30 Minutes Intravenous  Once 08/11/22 0739 08/11/22 1004   08/11/22 0745  vancomycin (VANCOREADY) IVPB 2000 mg/400 mL        2,000 mg 200 mL/hr over 120 Minutes Intravenous STAT 08/11/22 0740 08/11/22 1030   08/11/22 0745  meropenem (MERREM) 1 g in sodium chloride 0.9 % 100 mL IVPB        1 g 200 mL/hr over 30 Minutes Intravenous STAT 08/11/22 0740 08/11/22 0914          Medications  dicyclomine  10 mg Oral TID AC   famotidine  20 mg Oral QAC breakfast   feeding supplement ([employer] STANDARD 1.4)  325 mL Oral Daily   ketotifen  1 drop Right Eye BID   levothyroxine  150 mcg Oral QAC breakfast   lidocaine  2 patch Transdermal Q24H   magic mouthwash  5 mL Oral QID   megestrol  40 mg Oral BID   midodrine  10 mg Oral TID WC   nystatin cream   Topical  BID   pantoprazole  40 mg Oral BID AC   rifaximin  550 mg Oral BID   rOPINIRole  0.25 mg Oral QHS   sodium chloride flush  10-40 mL Intracatheter Q12H   tamsulosin  0.4 mg Oral Daily   venlafaxine XR  75 mg Oral Q breakfast   zinc sulfate  220 mg Oral Daily      Subjective:   [name] was seen and examined today.  Per patient, vaginal bleeding has resolved.  Continues to have back pain.  No nausea vomiting, chest pain or shortness of breath.  No fevers   Objective:   Vitals:   08/28/22 1413 08/28/22 2218 08/29/22 0342 08/29/22 1149  BP: 122/66 111/69 123/68 (!) 122/56  Pulse: 91 88 91 89  Resp: 20 20  16   Temp: 98.4 F (36.9 C) 98.3 F (36.8 C) 98.5 F (36.9 C) 98.1 F (36.7 C)  TempSrc:  Oral Oral   SpO2: 93% 95% 96% 95%  Weight:      Height:        Intake/Output Summary (Last 24 hours) at 08/29/2022 1358 Last data filed at 08/29/2022 1024 Gross per 24 hour  Intake 360 ml  Output --  Net 360 ml      Wt Readings from Last 3 Encounters:  08/19/22 130.1 kg  07/29/22 106.6 kg  06/15/22 106.6 kg    Physical Exam General: Alert and oriented x 3, NAD Cardiovascular: S1 S2 clear, RRR.  Respiratory: CTAB, no wheezing, rales or rhonchi Gastrointestinal: Soft, mild diffuse TTP, nondistended, NBS Ext: + pedal edema bilaterally  Neuro: no new deficits Psych: Normal affect   Data Reviewed:  I have personally reviewed following labs    CBC Lab Results  Component Value Date   WBC 5.5 08/29/2022   RBC 2.71 (L) 08/29/2022   HGB 8.0 (L) 08/29/2022   HCT 26.4 (L) 08/29/2022   MCV 97.4 08/29/2022   MCH 29.5 08/29/2022   PLT 98 (L) 08/29/2022   MCHC 30.3 08/29/2022   RDW 18.6 (H) 08/29/2022   LYMPHSABS 1.6 08/19/2022   MONOABS 0.8 08/19/2022   EOSABS 0.5 08/19/2022   BASOSABS 0.0 08/19/2022     Last metabolic panel Lab Results  Component Value Date   NA 140 08/29/2022   K 3.9 08/29/2022   CL 111 08/29/2022   CO2 24 08/29/2022   BUN 29 (H)  08/29/2022   CREATININE 0.99 08/29/2022   GLUCOSE 99 08/29/2022   GFRNONAA >60 08/29/2022   GFRAA >60 01/19/2019   CALCIUM 8.0 (L) 08/29/2022   PHOS 4.6 08/18/2022   PROT 5.2 (L) 08/29/2022   ALBUMIN 2.4 (L) 08/29/2022   BILITOT 2.2 (H) 08/29/2022   ALKPHOS 145 (H) 08/29/2022   AST 26 08/29/2022   ALT 18 08/29/2022   ANIONGAP 5 08/29/2022    CBG (last 3)  No results for input(s): "GLUCAP" in the last 72 hours.    Coagulation Profile: Recent Labs  Lab 08/25/22 0300 08/26/22 0010 08/27/22 0355 08/28/22 0408 08/29/22 0249  INR 1.9* 1.7* 1.7* 1.6* 1.6*     Radiology Studies: I have personally reviewed the imaging studies  MR THORACIC SPINE WO CONTRAST  Result Date: 08/28/2022 CLINICAL DATA:  Mid-back pain bacteremia, intractable back pain; Low back pain, infection suspected, positive xray/CT bacteremia, intractable back pain. EXAM: MRI THORACIC AND LUMBAR SPINE WITHOUT CONTRAST TECHNIQUE: Multiplanar and multiecho pulse sequences of the thoracic and lumbar spine were obtained without intravenous contrast. COMPARISON:  MRI thoracic and lumbar spine 06/13/2022. FINDINGS: Moderate motion artifact limits evaluation for fine detail. Axial images and lumbar STIR are nondiagnostic. MRI THORACIC SPINE FINDINGS Alignment:  Normal. Vertebrae: Acute compression fractures of the T3 and T5 vertebral bodies with marrow edema. Moderate height loss at T3. Mild-to-moderate height loss at T5. Within limitations of motion artifact, no evidence of discitis or osteomyelitis. Cord:  Limited evaluation due to motion artifact. Paraspinal and other soft tissues: Diffuse third-spacing of fluid. Disc levels: Multilevel lower thoracic disc degeneration resulting in mild spinal canal stenosis from T7-8 through at least T9-10. MRI LUMBAR SPINE FINDINGS Segmentation: Conventional numbering is assumed with 5 non-rib-bearing, lumbar type vertebral bodies. Alignment:  Unchanged trace retrolisthesis of L2 on L3.  Vertebrae: Normal vertebral body heights. Within limits of motion artifact, no evidence of discitis or osteomyelitis. Conus medullaris and cauda equina: Conus extends to the L1 level. Paraspinal and other soft tissues: Diffuse third-spacing of fluid. Disc levels: Grossly unchanged lumbar spondylosis. No severe spinal canal stenosis. IMPRESSION: 1. Moderate motion artifact limits evaluation for fine detail. Axial images and lumbar STIR are nondiagnostic. 2. Within limitations of motion artifact, no evidence of discitis or osteomyelitis. 3. Acute compression fractures of the T3 and T5 vertebral bodies with marrow edema. Moderate height loss at T3. Mild-to-moderate height loss at T5. Electronically Signed   By: Orvan [name] M.D.   On: 08/28/2022 14:15   MR LUMBAR SPINE WO CONTRAST  Result Date: 08/28/2022 CLINICAL DATA:  Mid-back pain bacteremia, intractable back pain; Low back pain, infection suspected, positive xray/CT bacteremia, intractable back pain. EXAM: MRI THORACIC AND LUMBAR SPINE WITHOUT CONTRAST  TECHNIQUE: Multiplanar and multiecho pulse sequences of the thoracic and lumbar spine were obtained without intravenous contrast. COMPARISON:  MRI thoracic and lumbar spine 06/13/2022. FINDINGS: Moderate motion artifact limits evaluation for fine detail. Axial images and lumbar STIR are nondiagnostic. MRI THORACIC SPINE FINDINGS Alignment:  Normal. Vertebrae: Acute compression fractures of the T3 and T5 vertebral bodies with marrow edema. Moderate height loss at T3. Mild-to-moderate height loss at T5. Within limitations of motion artifact, no evidence of discitis or osteomyelitis. Cord:  Limited evaluation due to motion artifact. Paraspinal and other soft tissues: Diffuse third-spacing of fluid. Disc levels: Multilevel lower thoracic disc degeneration resulting in mild spinal canal stenosis from T7-8 through at least T9-10. MRI LUMBAR SPINE FINDINGS Segmentation: Conventional numbering is assumed with 5  non-rib-bearing, lumbar type vertebral bodies. Alignment:  Unchanged trace retrolisthesis of L2 on L3. Vertebrae: Normal vertebral body heights. Within limits of motion artifact, no evidence of discitis or osteomyelitis. Conus medullaris and cauda equina: Conus extends to the L1 level. Paraspinal and other soft tissues: Diffuse third-spacing of fluid. Disc levels: Grossly unchanged lumbar spondylosis. No severe spinal canal stenosis. IMPRESSION: 1. Moderate motion artifact limits evaluation for fine detail. Axial images and lumbar STIR are nondiagnostic. 2. Within limitations of motion artifact, no evidence of discitis or osteomyelitis. 3. Acute compression fractures of the T3 and T5 vertebral bodies with marrow edema. Moderate height loss at T3. Mild-to-moderate height loss at T5. Electronically Signed   By: Orvan [name] M.D.   On: 08/28/2022 14:15       Emon Lance M.D. [facility] 08/29/2022, 1:58 PM  Available via Epic secure chat 7am-7pm After 7 pm, please refer to night coverage provider listed on amion.

## 2022-08-29 NOTE — Progress Notes (Signed)
Physical Therapy Treatment Patient Details Name: [name] MRN: [id number] DOB: Dec 27, 1955 Today's Date: 08/29/2022   History of Present Illness Pt is 67 yo female admitted on [birth year]31/1955 with septic shock due to L LE cellulitis and UTI.  Pt with hx including but not limited to NASH cirrhosis, HTN, PE, short gut syndrome, morbid obesity, IDA, pAF, HTN, HLD    PT Comments  General Comments: AxO x 3 familiar from prior admits.  Has a VERY supportive Son "[name]" who provides all care needs and even gets pt to go "out and about". Attempted OOB to amb was unsuccessful due to increased c/o pain despite having pain meds prior to session.  General bed mobility comments: very limited activity tolerance due to increased pain with attempt to transfer to EOB.  Unable to complete due to increased pain B groin/hip areas.  Assisted back to bed.  Pt requesting "something" for nausea.   Pt plans to D/C to home when medically ready.    If plan is discharge home, recommend the following: Assistance with cooking/housework;Help with stairs or ramp for entrance;A little help with walking and/or transfers;A little help with bathing/dressing/bathroom   Can travel by private vehicle     Yes  Equipment Recommendations  None recommended by PT    Recommendations for Other Services       Precautions / Restrictions Precautions Precautions: Fall Precaution Comments: T3 T5 comp Fx Restrictions Weight Bearing Restrictions: No     Mobility  Bed Mobility Overal bed mobility: Needs Assistance Bed Mobility: Supine to Sit, Sit to Supine           General bed mobility comments: very limited activity tolerance due to increased pain with attempt to transfer to EOB.  Unable to complete due to increased pain B groin/hip areas.    Transfers                        Ambulation/Gait                   Stairs             Wheelchair Mobility     Tilt Bed    Modified Rankin (Stroke  Patients Only)       Balance                                            Cognition Arousal: Alert Behavior During Therapy: WFL for tasks assessed/performed Overall Cognitive Status: Within Functional Limits for tasks assessed                                 General Comments: AxO x 3 familiar from prior admits.  Has a VERY supportive Son "[name]" who provides all care needs and even gets pt to go "out and about".        Exercises      General Comments        Pertinent Vitals/Pain Pain Assessment Pain Assessment: Faces Faces Pain Scale: Hurts even more Pain Location: B hips Pain Descriptors / Indicators: Grimacing, Guarding, Aching, Sore    Home Living                          Prior Function  PT Goals (current goals can now be found in the care plan section) Progress towards PT goals: Progressing toward goals    Frequency    Min 1X/week      PT Plan      Co-evaluation              AM-PAC PT "6 Clicks" Mobility   Outcome Measure  Help needed turning from your back to your side while in a flat bed without using bedrails?: A Little Help needed moving from lying on your back to sitting on the side of a flat bed without using bedrails?: A Little Help needed moving to and from a bed to a chair (including a wheelchair)?: A Little Help needed standing up from a chair using your arms (e.g., wheelchair or bedside chair)?: A Little Help needed to walk in hospital room?: A Little Help needed climbing 3-5 steps with a railing? : A Little 6 Click Score: 18    End of Session Equipment Utilized During Treatment: Gait belt Activity Tolerance: Patient tolerated treatment well Patient left: in chair;with call bell/phone within reach;with family/visitor present Nurse Communication: Mobility status PT Visit Diagnosis: Other abnormalities of gait and mobility (R26.89);Muscle weakness (generalized) (M62.81)      Time: 5621-3086 PT Time Calculation (min) (ACUTE ONLY): 13 min  Charges:    $Therapeutic Activity: 8-22 mins PT General Charges $$ ACUTE PT VISIT: 1 Visit                     {Sunday Klos  PTA Acute  Rehabilitation Services Office M-F          [phone]

## 2022-08-29 NOTE — Plan of Care (Signed)

## 2022-08-30 DIAGNOSIS — N179 Acute kidney failure, unspecified: Secondary | ICD-10-CM | POA: Diagnosis not present

## 2022-08-30 DIAGNOSIS — R6521 Severe sepsis with septic shock: Secondary | ICD-10-CM | POA: Diagnosis not present

## 2022-08-30 DIAGNOSIS — A419 Sepsis, unspecified organism: Secondary | ICD-10-CM | POA: Diagnosis not present

## 2022-08-30 LAB — BASIC METABOLIC PANEL
Anion gap: 5 (ref 5–15)
BUN: 24 mg/dL — ABNORMAL HIGH (ref 8–23)
CO2: 24 mmol/L (ref 22–32)
Calcium: 7.8 mg/dL — ABNORMAL LOW (ref 8.9–10.3)
Chloride: 111 mmol/L (ref 98–111)
Creatinine, Ser: 0.94 mg/dL (ref 0.44–1.00)
GFR, Estimated: >60 mL/min (ref >60)
Glucose, Bld: 102 mg/dL — ABNORMAL HIGH (ref 70–99)
Potassium: 3.7 mmol/L (ref 3.5–5.1)
Sodium: 140 mmol/L (ref 135–145)

## 2022-08-30 LAB — CBC
HCT: 26.4 % — ABNORMAL LOW (ref 36.0–46.0)
Hemoglobin: 8.2 g/dL — ABNORMAL LOW (ref 12.0–15.0)
MCH: 30.4 pg (ref 26.0–34.0)
MCHC: 31.1 g/dL (ref 30.0–36.0)
MCV: 97.8 fL (ref 80.0–100.0)
Platelets: 87 10*3/uL — ABNORMAL LOW (ref 150–400)
RBC: 2.7 MIL/uL — ABNORMAL LOW (ref 3.87–5.11)
RDW: 18.4 % — ABNORMAL HIGH (ref 11.5–15.5)
WBC: 5.1 10*3/uL (ref 4.0–10.5)
nRBC: 0 % (ref 0.0–0.2)

## 2022-08-30 LAB — BRAIN NATRIURETIC PEPTIDE: B Natriuretic Peptide: 614.9 pg/mL — ABNORMAL HIGH (ref 0.0–100.0)

## 2022-08-30 LAB — PROTIME-INR
INR: 1.7 — ABNORMAL HIGH (ref 0.8–1.2)
Prothrombin Time: 20.4 s — ABNORMAL HIGH (ref 11.4–15.2)

## 2022-08-30 MED ORDER — DIPHENHYDRAMINE HCL 25 MG PO CAPS: 50.0000 mg | ORAL_CAPSULE | Freq: Once | ORAL | Status: DC

## 2022-08-30 MED ORDER — FUROSEMIDE 10 MG/ML IJ SOLN
40.0000 mg | Freq: Once | INTRAMUSCULAR | Status: AC
  Administered 2022-08-30: 40 mg via INTRAVENOUS
  Filled 2022-08-30: qty 4

## 2022-08-30 MED ORDER — FUROSEMIDE 10 MG/ML IJ SOLN: 40.0000 mg | Freq: Every day | INTRAMUSCULAR | Status: DC

## 2022-08-30 MED ORDER — VANCOMYCIN HCL 1500 MG/300ML IV SOLN
1500.0000 mg | INTRAVENOUS | Status: AC
  Filled 2022-08-30: qty 300

## 2022-08-30 MED ORDER — PREDNISONE 50 MG PO TABS
50.0000 mg | ORAL_TABLET | Freq: Four times a day (QID) | ORAL | Status: AC
  Administered 2022-08-30 – 2022-08-31 (×2): 50 mg via ORAL
  Filled 2022-08-30 (×2): qty 1

## 2022-08-30 MED ORDER — FUROSEMIDE 40 MG PO TABS: 40.0000 mg | ORAL_TABLET | Freq: Every day | ORAL | Status: DC

## 2022-08-30 NOTE — Progress Notes (Addendum)
[facility] set up to pick up patient at 1000 to be at [facility] by 10:30 to for Kyphoplasty tomorrow.  Levora Angel, RN

## 2022-08-30 NOTE — Progress Notes (Signed)
Mobility Specialist - Progress Note   08/30/22 1456  Mobility  Activity Dangled on edge of bed (Seated Exercises)  Range of Motion/Exercises Right leg;Left leg;Active  Activity Response Tolerated well  Mobility Referral Yes  $Mobility charge 1 Mobility  Mobility Specialist Start Time (ACUTE ONLY) 0226  Mobility Specialist Stop Time (ACUTE ONLY) 0248  Mobility Specialist Time Calculation (min) (ACUTE ONLY) 22 min   Pt received in bed and agreeable to mobility.  Pt tolerated sitting EOB & doing seated LE exercises. No complaints during session. See below for exercises. Pt to bed after session with all needs met.   Seated BLE Exercises: 10 reps each  1) Toe Raise/ Heel Raise  2) Knee Extension (hold 3 seconds)  3) Marching   Chief Technology Officer

## 2022-08-30 NOTE — Plan of Care (Signed)

## 2022-08-30 NOTE — Progress Notes (Signed)
Occupational Therapy Treatment Patient Details Name: [name] MRN: [id number] DOB: Apr 03, 1955 Today's Date: 08/30/2022   History of present illness Patient is a 67 yr old woman who presented with with fatigue and shortness of breath.  Currently being treated for septic shock due to cellulitis & UTI. PMH: NASH cirrhosis, HTN, PE, a fib, HTN, HLD   OT comments  Pt was seen for functional strengthening and progressing out of bed activity, as such is needed to facilitate progressive ADL performance. She required min assist with slightly increased time and effort to achieve sitting EOB, then CGA using a RW in order to stand and perform a step-pivot transfer to the bedside chair using a RW. She gently declined further functional activity, reporting L breast pain and swelling. Continue OT plan of care.       If plan is discharge home, recommend the following:  Help with stairs or ramp for entrance;Assist for transportation;A little help with bathing/dressing/bathroom;A little help with walking and/or transfers   Equipment Recommendations  None recommended by OT    Recommendations for Other Services      Precautions / Restrictions Precautions Precautions: Fall Restrictions Weight Bearing Restrictions: No Other Position/Activity Restrictions: contact precautions       Mobility Bed Mobility Overal bed mobility: Needs Assistance Bed Mobility: Supine to Sit     Supine to sit: Min assist, Used rails, HOB elevated          Transfers Overall transfer level: Needs assistance Equipment used: Rolling walker (2 wheels) Transfers: Sit to/from Stand Sit to Stand: Contact guard assist     Step pivot transfers: Contact guard assist, From elevated surface               ADL either performed or assessed with clinical judgement   ADL Overall ADL's : Needs assistance/impaired Eating/Feeding: Independent;Sitting Eating/Feeding Details (indicate cue type and reason): based on  clinical judgement Grooming: Set up;Supervision/safety;Sitting Grooming Details (indicate cue type and reason): at chair level, based on clinical judgement         Upper Body Dressing : Set up;Sitting Upper Body Dressing Details (indicate cue type and reason): simulated at chair level Lower Body Dressing: Moderate assistance;Sit to/from stand Lower Body Dressing Details (indicate cue type and reason): based on clinical judgement                      Cognition Arousal: Alert Behavior During Therapy: WFL for tasks assessed/performed Overall Cognitive Status: Within Functional Limits for tasks assessed                        Pertinent Vitals/ Pain       Pain Assessment Pain Location: She reported pain of her L breast, however she did not quantify her pain. Pain Intervention(s): Limited activity within patient's tolerance, Monitored during session         Frequency  Min 1X/week        Progress Toward Goals  OT Goals(current goals can now be found in the care plan section)     Acute Rehab OT Goals OT Goal Formulation: With patient Time For Goal Achievement: 09/02/22 Potential to Achieve Goals: Good  Plan         AM-PAC OT "6 Clicks" Daily Activity     Outcome Measure   Help from another person eating meals?: None Help from another person taking care of personal grooming?: A Little Help from another person toileting, which includes using toliet,  bedpan, or urinal?: A Little Help from another person bathing (including washing, rinsing, drying)?: A Lot Help from another person to put on and taking off regular upper body clothing?: A Little Help from another person to put on and taking off regular lower body clothing?: A Lot 6 Click Score: 17    End of Session Equipment Utilized During Treatment: Rolling walker (2 wheels)  OT Visit Diagnosis: Pain;Muscle weakness (generalized) (M62.81);Unsteadiness on feet (R26.81)   Activity Tolerance Patient  limited by pain   Patient Left in chair;with call bell/phone within reach   Nurse Communication Mobility status        Time: 1036-1050 OT Time Calculation (min): 14 min  Charges: OT General Charges $OT Visit: 1 Visit OT Treatments $Therapeutic Activity: 8-22 mins     Reuben Likes, OTR/L 08/30/2022, 1:21 PM

## 2022-08-30 NOTE — Progress Notes (Addendum)
[facility]                                                                              [name], is a 67 y.o. female, DOB - [birth year], BMW:[id number] Admit date - 08/11/2022    Outpatient Primary MD for the patient is [name], MD  LOS - 19  days  Chief Complaint  Patient presents with   Hypotension   Shortness of Breath       Brief summary   Patient is a 67 y/o woman with a history of cirhrosis who presented with fatigue and shortness of breath. Currently being treated for septic shock due to cellulitis with Flavobacterium.  Initially with the ICU.  Treated with pressors.  Transition to midodrine. ID was consulted on 8/4 for flava bacterium bacteremia. Palliative care was consulted on 8/8 for goals of care currently DNR. Transferred to hospitalist service on 8/9. Transferred out of the stepdown on 8/10. IV antibiotic until 8/17. Started to have vaginal bleeding on 8/12, resolved.   Assessment & Plan    Principal Problem:  Septic shock (HCC) with AKI, multifactorial Flavobacterium bacteremia, LLE cellulitis and polymicrobial UTI Concern for cat scratch fever -Met criteria on admission with lactic acidosis, encephalopathy, AKI, hypotension, tachycardia and tachypnea -Patient was admitted to ICU, was placed on pressors, fluids and IV albumin -Patient was treated with cefepime and Zithromax (for 5 days to cover cat scratch/Bartonella) -Blood cultures positive for flavobacterium. - ID was consulted, placed on meropenem for 2 weeks, completed on 08/26/2022.  Discussed with ID, does not need any further antibiotics. -CT abdomen 8/15 showed pancolitis -C. difficile PCR negative, GI pathogen panel negative  Active problems Acute on chronic back pain -Due to bacteremia, acute on chronic back pain, MRI T-spine, L-spine was obtained -MRI showed acute compression fractures of T3 and T5 vertebral bodies with marrow edema, moderate height loss at T3,  mild to moderate height loss at T5.  No discitis or osteomyelitis -Discussed in detail with the patient regarding conservative management versus kyphoplasty. -Patient wants to pursue kyphoplasty, IR consulted. -CT chest showed small bilateral pleural effusions, left greater than right, patchy airspace disease, mild cardiomegaly, small amount of ascites in the upper abdomen, diffuse chest wall/body wall edema -No fevers, upper respiratory symptoms, no leukocytosis, patchy airspace disease is likely due to pulmonary edema   Decompensated NASH cirrhosis with ascites Coagulopathy, anemia, thrombocytopenia, anasarca H/o GIB  -Continue rifaximin,outpatient GI follow-up -Lactulose placed on prn due to diarrhea -Patient appears to have anasarca with hypoalbuminemia, diuretics were held during admission due to AKI, hypotension -BNP 614.9, CT chest with bilateral pleural effusions, ascites, diffuse chest wall/body wall edema -Placed on Lasix 40 mg IV daily, continue spironolactone   AKI with uremia, metabolic acidosis, - no obstruction or hydronephrosis on CT imaging, likely pre-renal. - Treated with IV pressors, IV fluids.  - Renal function has improved   Coagulopathy. Postmenopausal vaginal bleeding. Macrocytic anemia with acute on chronic blood loss anemia - Due to cirrhosis her INR has remained elevated. - Patient was started on heparin in the ICU for DVT prophylaxis, subsequently started having postmenopausal vaginal bleeding. -Received  Vitamin K x 1 on 8/12 x 5 days.  Heparin held, CT abdomen did not show any RPH -Pelvic ultrasound showed myomatous uterus with several fibroids - Dr Allena Katz discussed with hematology and GYN on-call, received tranexamic acid twice daily, I d/w Dr Debroah Loop (GYN on-call), on 8/15 recommended Megace 40 mg p.o. twice daily for 2 weeks, will arrange outpatient follow-up at OB/GYN center for further evaluation outpatient. -Vaginal bleeding has resolved.   Acute on  chronic HFpEF, hypertension Moderate aortic stenosis -2D echo 06/2022> EF 60-65%, G1DD, mod AS -CT chest showed bilateral pleural effusions, noted to have some wheezing today -BNP 614.9, started on Lasix 40 mg IV daily, spironolactone 25 mg daily -Strict I's and O's and daily weights     Physical deconditioning -PT evaluation   Hypothyroidism -Continue synthroid   Depression -Continue effexor     Headache. No focal deficits. Continue Tylenol.   Chronic right eye pain on movement towards medial side. Resolved. No red flag sign.   Chronic left ear pain. Outpatient follow-up with ENT recommended.  Right thyroid nodule 1.5 cm -Incidental seen on CT chest, recommend nonemergent thyroid ultrasound   Obesity Estimated body mass index is 52.46 kg/m as calculated from the following:   Height as of this encounter: 5\' 2"  (1.575 m).   Weight as of this encounter: 130.1 kg.  Code Status: DNR DVT Prophylaxis:  Place and maintain sequential compression device Start: 08/22/22 1149 SCDs Start: 08/11/22 1108   Level of Care: Level of care: Telemetry Family Communication: Updated patient's son on the phone today Disposition Plan:      Remains inpatient appropriate:    Procedures:    Consultants:   CCM, ID, GYN  Antimicrobials:   Anti-infectives (From admission, onward)    Start     Dose/Rate Route Frequency Ordered Stop   08/20/22 1200  meropenem (MERREM) 1 g in sodium chloride 0.9 % 100 mL IVPB  Status:  Discontinued        1 g 200 mL/hr over 30 Minutes Intravenous Every 8 hours 08/20/22 1101 08/26/22 1453   08/14/22 1800  azithromycin (ZITHROMAX) 250 mg in dextrose 5 % 125 mL IVPB  Status:  Discontinued        250 mg 127.5 mL/hr over 60 Minutes Intravenous Daily 08/14/22 1738 08/14/22 1752   08/14/22 1800  azithromycin (ZITHROMAX) 500 mg in sodium chloride 0.9 % 250 mL IVPB  Status:  Discontinued        500 mg 250 mL/hr over 60 Minutes Intravenous Daily 08/14/22 1752  08/15/22 1054   08/14/22 1500  meropenem (MERREM) 1 g in sodium chloride 0.9 % 100 mL IVPB  Status:  Discontinued        1 g 200 mL/hr over 30 Minutes Intravenous Every 12 hours 08/14/22 1354 08/20/22 1101   08/13/22 2100  ceFEPIme (MAXIPIME) 2 g in sodium chloride 0.9 % 100 mL IVPB  Status:  Discontinued        2 g 200 mL/hr over 30 Minutes Intravenous Every 24 hours 08/13/22 0436 08/13/22 1944   08/13/22 2100  Cefiderocol Sulfate Tosylate (FETROJA) 1 g in sodium chloride 0.9 % 100 mL IVPB  Status:  Discontinued        1 g 37.1 mL/hr over 3 Hours Intravenous Every 8 hours 08/13/22 1944 08/14/22 1353   08/13/22 0800  vancomycin (VANCOREADY) IVPB 1250 mg/250 mL  Status:  Discontinued        1,250 mg 166.7 mL/hr over 90 Minutes Intravenous Every 48  hours 08/11/22 1415 08/12/22 0717   08/12/22 2200  ceFEPIme (MAXIPIME) 2 g in sodium chloride 0.9 % 100 mL IVPB  Status:  Discontinued        2 g 200 mL/hr over 30 Minutes Intravenous Every 12 hours 08/12/22 1743 08/13/22 0436   08/12/22 1000  azithromycin (ZITHROMAX) tablet 250 mg  Status:  Discontinued       Placed in "Followed by" Linked Group   250 mg Oral Daily 08/11/22 1353 08/14/22 1738   08/11/22 2000  ceFEPIme (MAXIPIME) 2 g in sodium chloride 0.9 % 100 mL IVPB  Status:  Discontinued        2 g 200 mL/hr over 30 Minutes Intravenous Every 12 hours 08/11/22 1415 08/12/22 1743   08/11/22 1500  rifaximin (XIFAXAN) tablet 550 mg        550 mg Oral 2 times daily 08/11/22 1413     08/11/22 1445  azithromycin (ZITHROMAX) tablet 500 mg       Placed in "Followed by" Linked Group   500 mg Oral  Once 08/11/22 1353 08/11/22 1501   08/11/22 0745  vancomycin (VANCOCIN) IVPB 1000 mg/200 mL premix  Status:  Discontinued        1,000 mg 200 mL/hr over 60 Minutes Intravenous  Once 08/11/22 0739 08/11/22 0740   08/11/22 0745  clindamycin (CLEOCIN) IVPB 600 mg        600 mg 100 mL/hr over 30 Minutes Intravenous  Once 08/11/22 0739 08/11/22 1004    08/11/22 0745  vancomycin (VANCOREADY) IVPB 2000 mg/400 mL        2,000 mg 200 mL/hr over 120 Minutes Intravenous STAT 08/11/22 0740 08/11/22 1030   08/11/22 0745  meropenem (MERREM) 1 g in sodium chloride 0.9 % 100 mL IVPB        1 g 200 mL/hr over 30 Minutes Intravenous STAT 08/11/22 0740 08/11/22 0914          Medications  dicyclomine  10 mg Oral TID AC   famotidine  20 mg Oral QAC breakfast   feeding supplement (K[employer] STANDARD 1.4)  325 mL Oral Daily   [START ON 08/31/2022] furosemide  40 mg Oral Daily   ketotifen  1 drop Right Eye BID   levothyroxine  150 mcg Oral QAC breakfast   lidocaine  2 patch Transdermal Q24H   magic mouthwash  5 mL Oral QID   megestrol  40 mg Oral BID   midodrine  10 mg Oral TID WC   nystatin cream   Topical BID   pantoprazole  40 mg Oral BID AC   rifaximin  550 mg Oral BID   rOPINIRole  0.25 mg Oral QHS   sodium chloride flush  10-40 mL Intracatheter Q12H   spironolactone  25 mg Oral Daily   tamsulosin  0.4 mg Oral Daily   venlafaxine XR  75 mg Oral Q breakfast   zinc sulfate  220 mg Oral Daily      Subjective:   [name] was seen and examined today.  Noted some wheezing and mild shortness of breath.  No chest pain, hypoxia or coughing.  No fevers.  O2 sats 93% on room air.  Still has back pain.  Objective:   Vitals:   08/29/22 1149 08/29/22 2009 08/30/22 0622 08/30/22 1211  BP: (!) 122/56 104/64 (!) 150/78 132/86  Pulse: 89 80 79 88  Resp: 16 20 20 19   Temp: 98.1 F (36.7 C)  98.2 F (36.8 C) 98.1 F (36.7  C)  TempSrc:   Oral   SpO2: 95% 98% 92% 93%  Weight:      Height:        Intake/Output Summary (Last 24 hours) at 08/30/2022 1541 Last data filed at 08/30/2022 1247 Gross per 24 hour  Intake 120 ml  Output --  Net 120 ml      Wt Readings from Last 3 Encounters:  08/19/22 130.1 kg  07/29/22 106.6 kg  06/15/22 106.6 kg   Physical Exam General: Alert and oriented x 3, NAD Cardiovascular: S1 S2 clear, RRR.   Respiratory: Decreased BS at bases with mild expiratory wheezing, no rhonchi Gastrointestinal: Soft, nontender, nondistended, NBS Ext: + pedal edema bilaterally Neuro: no new deficits Psych: Normal affect   Data Reviewed:  I have personally reviewed following labs    CBC Lab Results  Component Value Date   WBC 5.1 08/30/2022   RBC 2.70 (L) 08/30/2022   HGB 8.2 (L) 08/30/2022   HCT 26.4 (L) 08/30/2022   MCV 97.8 08/30/2022   MCH 30.4 08/30/2022   PLT 87 (L) 08/30/2022   MCHC 31.1 08/30/2022   RDW 18.4 (H) 08/30/2022   LYMPHSABS 1.6 08/19/2022   MONOABS 0.8 08/19/2022   EOSABS 0.5 08/19/2022   BASOSABS 0.0 08/19/2022     Last metabolic panel Lab Results  Component Value Date   NA 140 08/30/2022   K 3.7 08/30/2022   CL 111 08/30/2022   CO2 24 08/30/2022   BUN 24 (H) 08/30/2022   CREATININE 0.94 08/30/2022   GLUCOSE 102 (H) 08/30/2022   GFRNONAA >60 08/30/2022   GFRAA >60 01/19/2019   CALCIUM 7.8 (L) 08/30/2022   PHOS 4.6 08/18/2022   PROT 5.2 (L) 08/29/2022   ALBUMIN 2.4 (L) 08/29/2022   BILITOT 2.2 (H) 08/29/2022   ALKPHOS 145 (H) 08/29/2022   AST 26 08/29/2022   ALT 18 08/29/2022   ANIONGAP 5 08/30/2022    CBG (last 3)  No results for input(s): "GLUCAP" in the last 72 hours.    Coagulation Profile: Recent Labs  Lab 08/26/22 0010 08/27/22 0355 08/28/22 0408 08/29/22 0249 08/30/22 0354  INR 1.7* 1.7* 1.6* 1.6* 1.7*     Radiology Studies: I have personally reviewed the imaging studies  CT CHEST WO CONTRAST  Result Date: 08/29/2022 CLINICAL DATA:  Chest wall pain. EXAM: CT CHEST WITHOUT CONTRAST TECHNIQUE: Multidetector CT imaging of the chest was performed following the standard protocol without IV contrast. RADIATION DOSE REDUCTION: This exam was performed according to the departmental dose-optimization program which includes automated exposure control, adjustment of the mA and/or kV according to patient size and/or use of iterative reconstruction  technique. COMPARISON:  MRI thoracic and lumbar spine 08/28/2022. CT chest abdomen and pelvis 08/11/2022. FINDINGS: Cardiovascular: Heart is mildly enlarged. Aorta is normal in size. There are atherosclerotic calcifications of the aorta and coronary arteries. Right-sided central venous catheter tip ends in the SVC. There is no pericardial effusion. Mediastinum/Nodes: No enlarged mediastinal or axillary lymph nodes. Peripherally calcified nodule in the right thyroid gland measures 15 mm. Visualized esophagus is within normal limits. Lungs/Pleura: There are small bilateral pleural effusions, left greater than right. Patchy airspace disease is seen in the right upper lobe posterior medially in the superior segment of the left lower lobe. There are atelectatic changes in both lung bases. There is no pneumothorax. Trachea and central airways are patent. Upper Abdomen: Small amount of ascites in the upper abdomen. Spleen is mildly enlarged. There is a nonobstructing right renal calculus measuring  2 mm. Cholecystectomy clips are present. Musculoskeletal: There is diffuse chest wall/body wall edema. Compression fractures T3 and T5 appear unchanged. IMPRESSION: 1. Small bilateral pleural effusions, left greater than right. 2. Patchy airspace disease in the right upper lobe and superior segment of the left lower lobe worrisome for multifocal pneumonia. 3. Mild cardiomegaly. 4. Small amount of ascites in the upper abdomen. 5. Diffuse chest wall/body wall edema. 6. Splenomegaly. 7. Nonobstructing right renal calculus. 8. Incidental right thyroid nodule measuring 1.5 cm. Recommend non-emergent thyroid ultrasound. Reference: J Am Coll Radiol. 2015 Feb;12(2): 143-50 Aortic Atherosclerosis (ICD10-I70.0). Electronically Signed   By: Darliss Cheney M.D.   On: 08/29/2022 22:05       Cristela Stalder M.D. [facility] 08/30/2022, 3:41 PM  Available via Epic secure chat 7am-7pm After 7 pm, please refer to night coverage  provider listed on amion.

## 2022-08-30 NOTE — Consult Note (Signed)
Chief Complaint: Patient was seen in consultation today for image guided T3, T5 vertebral body augmentation/kyphoplasty Chief Complaint  Patient presents with   Hypotension   Shortness of Breath    Referring Physician(s): Rai,R  Supervising Physician: Baldemar Lenis  Patient Status: [facility] - In-pt  History of Present Illness: [name] is a 67 y.o. 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  Recent imaging has revealed acute compression fractures of T3 and T5 with marrow edema with moderate height loss at T3 and mild to moderate height loss at T5.  Despite conservative pain management measures patient continues to have moderate to severe upper back pain exacerbated with movement and with occasional radiation of pain down lower back.  Request now received from hospitalist team for consideration of vertebral body augmentation.  Past Medical History:  Diagnosis Date   Chronic diarrhea    Cirrhosis, 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   pt stated on admission history review   Heart murmur    Hypertension    Kidney stone    Obesity    Pulmonary embolism (HCC)    Short gut syndrome    Thyroid disease     Past Surgical History:  Procedure Laterality Date   CHOLECYSTECTOMY     COLON SURGERY     HERNIA REPAIR     ILEOSTOMY     ILEOSTOMY CLOSURE     KIDNEY STONE SURGERY     WRIST SURGERY      Allergies: Ace inhibitors, Ivp dye [iodinated contrast media], Desitin [zinc oxide], Chlorhexidine, Doxycycline, Penicillins, and Zofran  [ondansetron]  Medications: Prior to Admission medications   Medication Sig Start Date End Date Taking? Authorizing Provider  acetaminophen (TYLENOL) 325 MG tablet Take 650 mg by mouth every 6 (six) hours as needed for moderate pain or headache.   Yes [provider]  atorvastatin (LIPITOR) 40 MG tablet Take 40 mg by mouth in the morning. 01/19/22  Yes [provider]  ciclopirox (PENLAC) 8 % solution Apply 1 application  topically See admin instructions. Apply over all toenails and surrounding skin at bedtime. Apply daily over previous coat. After seven (7) days, may remove with alcohol and continue cycle.   Yes [provider]  colchicine 0.6 MG tablet Take 0.6 mg by mouth daily as needed (gout).   Yes [provider]  cyanocobalamin (VITAMIN B12) 1000 MCG tablet Take 2,000 mcg by mouth every Friday.   Yes [provider]  EPINEPHrine 0.3 mg/0.3 mL IJ SOAJ injection Inject 0.3 mg into the muscle as needed for anaphylaxis. 08/07/19  Yes [provider]  famotidine (PEPCID) 20 MG tablet Take 20 mg by mouth daily before breakfast. 06/08/22  Yes [provider]  folic acid (FOLVITE) 1 MG tablet Take 1 tablet (1 mg total) by mouth daily. 06/16/22  Yes Calvert Cantor, MD  furosemide (LASIX) 20 MG tablet Take 20 mg by mouth in the morning.   Yes [provider]  HYDROcodone-acetaminophen (NORCO) 10-325 MG tablet Take 1 tablet by mouth every 6 (six) hours as needed for moderate pain or severe pain. 02/25/22  Yes [provider]  hydrOXYzine (ATARAX/VISTARIL) 25 MG tablet Take 25 mg by mouth  3 (three) times daily as needed for itching.   Yes [provider]  IRON-VITAMIN C PO Take 1 tablet by mouth daily with breakfast.   Yes [provider]  lactulose (CHRONULAC) 10 GM/15ML solution Take 15 mLs (10 g total) by mouth 2 (two) times daily. Patient taking differently: Take 10 g by mouth 2 (two) times daily as needed  for mild constipation or moderate constipation. 05/10/22  Yes Osvaldo Shipper, MD  levothyroxine (SYNTHROID) 150 MCG tablet Take 150 mcg by mouth daily before breakfast. 10/26/21  Yes [provider]  magnesium gluconate (MAGONATE) 500 MG tablet Take 1,000 mg by mouth daily.   Yes [provider]  methocarbamol (ROBAXIN) 500 MG tablet Take 500 mg by mouth in the morning. 03/04/22  Yes [provider]  metoprolol succinate (TOPROL-XL) 25 MG 24 hr tablet Take 12.5 mg by mouth daily as needed (as directed for A-FIB).   Yes [provider]  midodrine (PROAMATINE) 5 MG tablet Take 5 mg by mouth daily as needed (low blood pressure).   Yes [provider]  naloxone (NARCAN) nasal spray 4 mg/0.1 mL Place 1 spray into the nose as needed (accidental overdose). 03/25/22  Yes [provider]  nystatin cream (MYCOSTATIN) Apply topically 2 (two) times daily. Patient taking differently: Apply 1 Application topically 2 (two) times daily as needed for dry skin (or irritation). 05/22/22  Yes Leatha Gilding, MD  nystatin powder Apply 1 Application topically 3 (three) times daily as needed (for irritation- affected areas).   Yes [provider]  pantoprazole (PROTONIX) 40 MG tablet Take 1 tablet (40 mg total) by mouth daily. 05/10/22  Yes Osvaldo Shipper, MD  potassium chloride (KLOR-CON) 10 MEQ tablet Take 20 mEq by mouth in the morning and at bedtime.   Yes [provider]  predniSONE (DELTASONE) 10 MG tablet Take 10-60 mg by mouth See admin instructions. Take 60 mg by mouth once a day with breakfast and decrease by 10 mg each day until finished 08/06/22  Yes [provider]  promethazine (PHENERGAN) 12.5 MG tablet Take 12.5 mg by mouth every 6 (six) hours as needed for nausea or vomiting.   Yes [provider]  rifaximin (XIFAXAN) 550 MG TABS tablet Take 550 mg by mouth 2 (two) times daily.   Yes [provider]   spironolactone (ALDACTONE) 25 MG tablet Take 0.5 tablets (12.5 mg total) by mouth daily. Patient taking differently: Take 25 mg by mouth daily. 05/22/22  Yes Leatha Gilding, MD  triamcinolone cream (KENALOG) 0.5 % Apply to affected area of right breast twice daily as directed. Patient taking differently: Apply 1 Application topically 2 (two) times daily as needed (for rashes- affected areas). 05/10/22  Yes Osvaldo Shipper, MD  venlafaxine XR (EFFEXOR-XR) 75 MG 24 hr capsule Take 75 mg by mouth daily with breakfast. 08/17/21  Yes [provider]  Vitamin D, Ergocalciferol, (DRISDOL) 50000 UNITS CAPS Take 50,000 Units by mouth every Wednesday.   Yes [provider]  furosemide (LASIX) 40 MG tablet Take 1 tablet (40 mg total) by mouth daily. Patient not taking: Reported on 08/11/2022 05/22/22   Leatha Gilding, MD     History reviewed. No pertinent family history.  Social History   Socioeconomic History   Marital status: Single    Spouse name: Not on file   Number of children: 1   Years of education: Not on file   Highest education level: Not on file  Occupational History  Not on file  Tobacco Use   Smoking status: Former    Current packs/day: 0.00    Types: Cigarettes    Quit date: 70    Years since quitting: 37.6    Passive exposure: Past   Smokeless tobacco: Never  Vaping Use   Vaping status: Never Used  Substance and Sexual Activity   Alcohol use: No   Drug use: No   Sexual activity: Not Currently    Birth control/protection: Post-menopausal  Other Topics Concern   Not on file  Social History Narrative   Not on file   Social Determinants of Health   Financial Resource Strain: Not on file  Food Insecurity: No Food Insecurity (08/11/2022)   Hunger Vital Sign    Worried About Running Out of Food in the Last Year: Never true    Ran Out of Food in the Last Year: Never true  Transportation Needs: No Transportation Needs (08/11/2022)   PRAPARE -  Administrator, Civil Service (Medical): No    Lack of Transportation (Non-Medical): No  Physical Activity: Not on file  Stress: Not on file  Social Connections: Not on file      Review of Systems see above; currently denies fever, headache, chest pain, worsening dyspnea, cough, worsening abdominal pain, nausea, vomiting or bleeding.  Vital Signs: BP 132/86 (BP Location: Left Arm)   Pulse 88   Temp 98.1 F (36.7 C)   Resp 19   Ht 5\' 2"  (1.575 m)   Wt 286 lb 13.1 oz (130.1 kg)   SpO2 93%   BMI 52.46 kg/m     Physical Exam: awake, alert.  Chest with slightly diminished breath sounds bases.  Heart with reg rate and rhythm.  Abdomen obese, soft, +bowel sounds, some mild diffuse tenderness to palpation.  Bilateral pedal edema.  Paravertebral point tenderness to palpitation T3 ,T5 levels  Imaging: CT CHEST WO CONTRAST  Result Date: 08/29/2022 CLINICAL DATA:  Chest wall pain. EXAM: CT CHEST WITHOUT CONTRAST TECHNIQUE: Multidetector CT imaging of the chest was performed following the standard protocol without IV contrast. RADIATION DOSE REDUCTION: This exam was performed according to the departmental dose-optimization program which includes automated exposure control, adjustment of the mA and/or kV according to patient size and/or use of iterative reconstruction technique. COMPARISON:  MRI thoracic and lumbar spine 08/28/2022. CT chest abdomen and pelvis 08/11/2022. FINDINGS: Cardiovascular: Heart is mildly enlarged. Aorta is normal in size. There are atherosclerotic calcifications of the aorta and coronary arteries. Right-sided central venous catheter tip ends in the SVC. There is no pericardial effusion. Mediastinum/Nodes: No enlarged mediastinal or axillary lymph nodes. Peripherally calcified nodule in the right thyroid gland measures 15 mm. Visualized esophagus is within normal limits. Lungs/Pleura: There are small bilateral pleural effusions, left greater than right. Patchy  airspace disease is seen in the right upper lobe posterior medially in the superior segment of the left lower lobe. There are atelectatic changes in both lung bases. There is no pneumothorax. Trachea and central airways are patent. Upper Abdomen: Small amount of ascites in the upper abdomen. Spleen is mildly enlarged. There is a nonobstructing right renal calculus measuring 2 mm. Cholecystectomy clips are present. Musculoskeletal: There is diffuse chest wall/body wall edema. Compression fractures T3 and T5 appear unchanged. IMPRESSION: 1. Small bilateral pleural effusions, left greater than right. 2. Patchy airspace disease in the right upper lobe and superior segment of the left lower lobe worrisome for multifocal pneumonia. 3. Mild cardiomegaly. 4. Small amount of  ascites in the upper abdomen. 5. Diffuse chest wall/body wall edema. 6. Splenomegaly. 7. Nonobstructing right renal calculus. 8. Incidental right thyroid nodule measuring 1.5 cm. Recommend non-emergent thyroid ultrasound. Reference: J Am Coll Radiol. 2015 Feb;12(2): 143-50 Aortic Atherosclerosis (ICD10-I70.0). Electronically Signed   By: Darliss Cheney M.D.   On: 08/29/2022 22:05   MR THORACIC SPINE WO CONTRAST  Result Date: 08/28/2022 CLINICAL DATA:  Mid-back pain bacteremia, intractable back pain; Low back pain, infection suspected, positive xray/CT bacteremia, intractable back pain. EXAM: MRI THORACIC AND LUMBAR SPINE WITHOUT CONTRAST TECHNIQUE: Multiplanar and multiecho pulse sequences of the thoracic and lumbar spine were obtained without intravenous contrast. COMPARISON:  MRI thoracic and lumbar spine 06/13/2022. FINDINGS: Moderate motion artifact limits evaluation for fine detail. Axial images and lumbar STIR are nondiagnostic. MRI THORACIC SPINE FINDINGS Alignment:  Normal. Vertebrae: Acute compression fractures of the T3 and T5 vertebral bodies with marrow edema. Moderate height loss at T3. Mild-to-moderate height loss at T5. Within  limitations of motion artifact, no evidence of discitis or osteomyelitis. Cord:  Limited evaluation due to motion artifact. Paraspinal and other soft tissues: Diffuse third-spacing of fluid. Disc levels: Multilevel lower thoracic disc degeneration resulting in mild spinal canal stenosis from T7-8 through at least T9-10. MRI LUMBAR SPINE FINDINGS Segmentation: Conventional numbering is assumed with 5 non-rib-bearing, lumbar type vertebral bodies. Alignment:  Unchanged trace retrolisthesis of L2 on L3. Vertebrae: Normal vertebral body heights. Within limits of motion artifact, no evidence of discitis or osteomyelitis. Conus medullaris and cauda equina: Conus extends to the L1 level. Paraspinal and other soft tissues: Diffuse third-spacing of fluid. Disc levels: Grossly unchanged lumbar spondylosis. No severe spinal canal stenosis. IMPRESSION: 1. Moderate motion artifact limits evaluation for fine detail. Axial images and lumbar STIR are nondiagnostic. 2. Within limitations of motion artifact, no evidence of discitis or osteomyelitis. 3. Acute compression fractures of the T3 and T5 vertebral bodies with marrow edema. Moderate height loss at T3. Mild-to-moderate height loss at T5. Electronically Signed   By: Orvan Falconer M.D.   On: 08/28/2022 14:15   MR LUMBAR SPINE WO CONTRAST  Result Date: 08/28/2022 CLINICAL DATA:  Mid-back pain bacteremia, intractable back pain; Low back pain, infection suspected, positive xray/CT bacteremia, intractable back pain. EXAM: MRI THORACIC AND LUMBAR SPINE WITHOUT CONTRAST TECHNIQUE: Multiplanar and multiecho pulse sequences of the thoracic and lumbar spine were obtained without intravenous contrast. COMPARISON:  MRI thoracic and lumbar spine 06/13/2022. FINDINGS: Moderate motion artifact limits evaluation for fine detail. Axial images and lumbar STIR are nondiagnostic. MRI THORACIC SPINE FINDINGS Alignment:  Normal. Vertebrae: Acute compression fractures of the T3 and T5 vertebral  bodies with marrow edema. Moderate height loss at T3. Mild-to-moderate height loss at T5. Within limitations of motion artifact, no evidence of discitis or osteomyelitis. Cord:  Limited evaluation due to motion artifact. Paraspinal and other soft tissues: Diffuse third-spacing of fluid. Disc levels: Multilevel lower thoracic disc degeneration resulting in mild spinal canal stenosis from T7-8 through at least T9-10. MRI LUMBAR SPINE FINDINGS Segmentation: Conventional numbering is assumed with 5 non-rib-bearing, lumbar type vertebral bodies. Alignment:  Unchanged trace retrolisthesis of L2 on L3. Vertebrae: Normal vertebral body heights. Within limits of motion artifact, no evidence of discitis or osteomyelitis. Conus medullaris and cauda equina: Conus extends to the L1 level. Paraspinal and other soft tissues: Diffuse third-spacing of fluid. Disc levels: Grossly unchanged lumbar spondylosis. No severe spinal canal stenosis. IMPRESSION: 1. Moderate motion artifact limits evaluation for fine detail. Axial images and lumbar STIR are  nondiagnostic. 2. Within limitations of motion artifact, no evidence of discitis or osteomyelitis. 3. Acute compression fractures of the T3 and T5 vertebral bodies with marrow edema. Moderate height loss at T3. Mild-to-moderate height loss at T5. Electronically Signed   By: Orvan Falconer M.D.   On: 08/28/2022 14:15   DG Abd Portable 1V  Result Date: 08/27/2022 CLINICAL DATA:  Abdominal pain EXAM: PORTABLE ABDOMEN - 1 VIEW COMPARISON:  08/15/2022 abdominal radiograph FINDINGS: Cholecystectomy clips are seen in the right upper quadrant of the abdomen. Bulky calcified central pelvic masses up to 6.7 cm compatible with degenerated fibroids as seen on recent CT. Gas-filled mildly dilated small bowel loops throughout the central abdomen up to 3.8 cm diameter. Moderate colonic gas most prominent in the transverse colon. No evidence of pneumatosis or pneumoperitoneum. IMPRESSION: Gas-filled  mildly dilated small bowel loops throughout the central abdomen. Moderate colonic gas most prominent in the transverse colon. Findings favor mild adynamic ileus. Electronically Signed   By: Delbert Phenix M.D.   On: 08/27/2022 13:14   DG Chest Port 1 View  Result Date: 08/26/2022 CLINICAL DATA:  Status post central line placement EXAM: PORTABLE CHEST 1 VIEW COMPARISON:  08/12/2022 FINDINGS: Right PICC is again seen in satisfactory position. Cardiac shadow is enlarged. Aortic calcifications are noted. Central vascular congestion is noted without focal infiltrate. IMPRESSION: Right PICC in satisfactory position. Increased central vascular congestion. Electronically Signed   By: Alcide Clever M.D.   On: 08/26/2022 01:20   CT ABDOMEN PELVIS WO CONTRAST  Result Date: 08/25/2022 CLINICAL DATA:  Abdominal pain EXAM: CT ABDOMEN AND PELVIS WITHOUT CONTRAST TECHNIQUE: Multidetector CT imaging of the abdomen and pelvis was performed following the standard protocol without IV contrast. RADIATION DOSE REDUCTION: This exam was performed according to the departmental dose-optimization program which includes automated exposure control, adjustment of the mA and/or kV according to patient size and/or use of iterative reconstruction technique. COMPARISON:  CT abdomen/pelvis dated 08/11/2022 FINDINGS: Lower chest: Small bilateral pleural effusions. Mild bibasilar atelectasis. Hepatobiliary: Cirrhosis. Status post cholecystectomy. No intrahepatic or extrahepatic dilatation. Pancreas: Within normal limits. Spleen: Within the upper limits of normal for size. Adrenals/Urinary Tract: Adrenal glands are within normal limits. 3 mm nonobstructing right upper pole renal calculus (series 2/image 25). Kidneys are otherwise within normal limits. No hydronephrosis. Bladder is within normal limits. Stomach/Bowel: Stomach is normal limits. No evidence of bowel obstruction. Appendix is not discretely visualized. Pancolonic wall thickening, most  prominent in the ascending and proximal transverse colon (series 2/image 41), suggesting infectious/inflammatory colitis. Vascular/Lymphatic: No evidence of abdominal aortic aneurysm. Atherosclerotic calcifications of the abdominal aorta and branch vessels. No suspicious abdominopelvic lymphadenopathy. Reproductive: Calcified uterine fibroids. No adnexal masses. Other: Moderate abdominopelvic ascites, mildly progressive. Small fat/fluid containing left paramidline lower abdominal wall hernia (series 2/image 55), unchanged. Musculoskeletal: Mild degenerative changes of the visualized thoracolumbar spine. IMPRESSION: Suspected infectious/inflammatory pancolitis. Cirrhosis.  Moderate abdominopelvic ascites, mildly progressive. 3 mm nonobstructing right upper pole renal calculus. Small bilateral pleural effusions. Electronically Signed   By: Charline Bills M.D.   On: 08/25/2022 23:12   US PELVIC COMPLETE WITH TRANSVAGINAL  Result Date: 08/23/2022 CLINICAL DATA:  Postmenopausal bleeding. EXAM: TRANSABDOMINAL AND TRANSVAGINAL ULTRASOUND OF PELVIS TECHNIQUE: Both transabdominal and transvaginal ultrasound examinations of the pelvis were performed. Transabdominal technique was performed for global imaging of the pelvis including uterus, ovaries, adnexal regions, and pelvic cul-de-sac. It was necessary to proceed with endovaginal exam following the transabdominal exam to visualize the endometrium and ovaries. COMPARISON:  Ultrasound  dated 11/06/2011. FINDINGS: Evaluation is limited due to limited transvaginal exam as the patient was not able to tolerate transvaginal imaging. Uterus Measurements: 12.0 x 6.1 x 10.7 cm = volume: 411 ML. The uterus is poorly visualized. Several fibroids including a 4.8 x 3.7 x 4.4 cm right posterior body intramural fibroid, a 5.3 x 4.8 x 6.0 cm anterior upper body and a 2.8 x 2.7 x 3.1 cm anterior mid body submucosal fibroids. Endometrium The endometrium is poorly visualized and not  evaluated. Right ovary Not visualized. Left ovary Not visualized. Other findings No abnormal free fluid. IMPRESSION: 1. Myomatous uterus. 2. Nonvisualization of the endometrium and ovaries. Electronically Signed   By: Elgie Collard M.D.   On: 08/23/2022 20:43   DG Abd 1 View  Result Date: 08/15/2022 CLINICAL DATA:  [id number] Encounter for imaging study to confirm nasogastric (NG) tube placement [id number] EXAM: ABDOMEN - 1 VIEW COMPARISON:  11/20/2021. FINDINGS: The bowel gas pattern is non-obstructive. No evidence of pneumoperitoneum. No acute osseous abnormalities. Pulmonary vascular congestion. Correlate clinically to exclude superimposed pneumonia. The soft tissues are otherwise within normal limits. Surgical changes, devices, tubes and lines: An dobhoff tube extends below diaphragm, tip overlying the stomach. IMPRESSION: 1. Dobhoff tube tip overlying the stomach. 2. Pulmonary vascular congestion. Electronically Signed   By: Jules Schick M.D.   On: 08/15/2022 10:43   VAS [place] LOWER EXTREMITY VENOUS (DVT)  Result Date: 08/13/2022  Lower Venous DVT Study Patient Name:  AVE SCHMIER  Date of Exam:   08/12/2022 Medical Rec #: [id number]       Accession #:    [id number] Date of Birth: Dec 26, 1955        Patient Gender: F Patient Age:   61 years Exam Location:  [facility] Procedure:      VAS [place] LOWER EXTREMITY VENOUS (DVT) Referring Phys: Melody Comas --------------------------------------------------------------------------------  Indications: Leg pain and edema, LT>RT.  Limitations: Body habitus and patient pain/tension with compression. Comparison Study: 05-14-2021 Prior lower extremity venous study was negative for                   DVT. Performing Technologist: Jean Rosenthal RDMS, RVT  Examination Guidelines: A complete evaluation includes B-mode imaging, spectral Doppler, color Doppler, and power Doppler as needed of all accessible portions of each vessel. Bilateral testing is considered an integral  part of a complete examination. Limited examinations for reoccurring indications may be performed as noted. The reflux portion of the exam is performed with the patient in reverse Trendelenburg.  +---------+---------------+---------+-----------+----------+-------------------+ RIGHT    CompressibilityPhasicitySpontaneityPropertiesThrombus Aging      +---------+---------------+---------+-----------+----------+-------------------+ CFV      Full           Yes      Yes                                      +---------+---------------+---------+-----------+----------+-------------------+ SFJ      Full                                                             +---------+---------------+---------+-----------+----------+-------------------+ FV Prox  Full                                                             +---------+---------------+---------+-----------+----------+-------------------+  FV Mid   Full           Yes      Yes                                      +---------+---------------+---------+-----------+----------+-------------------+ FV DistalFull           Yes      Yes                                      +---------+---------------+---------+-----------+----------+-------------------+ PFV      Full                                                             +---------+---------------+---------+-----------+----------+-------------------+ POP      Full           Yes      Yes                                      +---------+---------------+---------+-----------+----------+-------------------+ PTV      Full                                                             +---------+---------------+---------+-----------+----------+-------------------+ PERO                                                  Not well visualized +---------+---------------+---------+-----------+----------+-------------------+    +---------+---------------+---------+-----------+----------+--------------+ LEFT     CompressibilityPhasicitySpontaneityPropertiesThrombus Aging +---------+---------------+---------+-----------+----------+--------------+ CFV      Full           Yes      Yes                                 +---------+---------------+---------+-----------+----------+--------------+ SFJ      Full                                                        +---------+---------------+---------+-----------+----------+--------------+ FV Prox  Full                                                        +---------+---------------+---------+-----------+----------+--------------+ FV Mid   Full                                                        +---------+---------------+---------+-----------+----------+--------------+  FV DistalFull                                                        +---------+---------------+---------+-----------+----------+--------------+ PFV      Full                                                        +---------+---------------+---------+-----------+----------+--------------+ POP      Full           Yes      Yes                                 +---------+---------------+---------+-----------+----------+--------------+ PTV      Full                                                        +---------+---------------+---------+-----------+----------+--------------+ PERO     Full                                                        +---------+---------------+---------+-----------+----------+--------------+     Summary: RIGHT: - There is no evidence of deep vein thrombosis in the lower extremity. However, portions of this examination were limited- see technologist comments above.  - No cystic structure found in the popliteal fossa.  LEFT: - There is no evidence of deep vein thrombosis in the lower extremity. However, portions of this examination were  limited- see technologist comments above.  - No cystic structure found in the popliteal fossa.  *See table(s) above for measurements and observations. Electronically signed by Heath Lark on 08/13/2022 at 1:18:45 PM.    Final    DG Chest Port 1 View  Result Date: 08/12/2022 CLINICAL DATA:  Central line placement EXAM: PORTABLE CHEST 1 VIEW COMPARISON:  Chest x-ray 08/11/2022 FINDINGS: There is a new right-sided central venous catheter with distal tip projecting over the SVC. The heart is enlarged there central pulmonary vascular congestion. There is no pleural effusion or pneumothorax. No acute fractures are seen. IMPRESSION: New right-sided central venous catheter with distal tip projecting over the SVC. No pneumothorax. Electronically Signed   By: Darliss Cheney M.D.   On: 08/12/2022 15:51   US Abdomen Limited RUQ (LIVER/GB)  Result Date: 08/12/2022 CLINICAL DATA:  Abnormal liver function tests EXAM: ULTRASOUND ABDOMEN LIMITED RIGHT UPPER QUADRANT COMPARISON:  CT 08/11/2022 FINDINGS: Gallbladder: Surgically absent Common bile duct: Diameter: 3 mm Liver: Heterogeneous echogenic hepatic parenchyma consistent with fatty liver infiltration. Portal vein is patent on color Doppler imaging with normal direction of blood flow towards the liver. Other: Mild ascites. IMPRESSION: Previous cholecystectomy.  No ductal dilatation. Nodular heterogeneous liver with some mild ascites. Please correlate with the prior CT Electronically Signed   By: Karen Kays M.D.   On: 08/12/2022 11:10  [place] EKG SITE RITE  Result Date: 08/12/2022 If Site Rite image not attached, placement could not be confirmed due to current cardiac rhythm.  CT CHEST ABDOMEN PELVIS WO CONTRAST  Result Date: 08/11/2022 CLINICAL DATA:  Sepsis, left leg cellulitis EXAM: CT CHEST, ABDOMEN AND PELVIS WITHOUT CONTRAST CT LEFT FEMUR WITHOUT CONTRAST CT LEFT TIBIA AND FIBULA WITHOUT CONTRAST TECHNIQUE: Multidetector CT imaging of the chest, abdomen and pelvis  was performed following the standard protocol without IV contrast. RADIATION DOSE REDUCTION: This exam was performed according to the departmental dose-optimization program which includes automated exposure control, adjustment of the mA and/or kV according to patient size and/or use of iterative reconstruction technique. COMPARISON:  04/30/2022 FINDINGS: CT CHEST FINDINGS Cardiovascular: Aortic atherosclerosis. Aortic valve calcifications. Normal heart size. Three-vessel coronary artery calcifications. No pericardial effusion. Mediastinum/Nodes: No enlarged mediastinal, hilar, or axillary lymph nodes. Thyroid gland, trachea, and esophagus demonstrate no significant findings. Lungs/Pleura: Trace left pleural effusion. Bandlike scarring or atelectasis of the lung bases. No pleural effusion or pneumothorax. Musculoskeletal: No chest wall abnormality. New, although age indeterminate mild superior endplate wedge deformity of T5 with less than 25% anterior height loss (series 12, image 126). CT ABDOMEN PELVIS FINDINGS Hepatobiliary: Coarse, nodular cirrhotic morphology of the liver. No obvious liver lesion on noncontrast CT. Status post cholecystectomy. No biliary ductal dilatation. Pancreas: Unremarkable. No pancreatic ductal dilatation or surrounding inflammatory changes. Spleen: Mild splenomegaly, maximum span 14.3 cm. Adrenals/Urinary Tract: Adrenal glands are unremarkable. Kidneys are normal, without renal calculi, solid lesion, or hydronephrosis. Bladder is unremarkable. Stomach/Bowel: Stomach is within normal limits. Status post partial right hemicolectomy and ileocolic reanastomosis. No evidence of bowel wall thickening, distention, or inflammatory changes. Vascular/Lymphatic: Aortic atherosclerosis. Large retroperitoneal and mesenteric varices (series 4, image 75, 67). No enlarged abdominal or pelvic lymph nodes. Reproductive: Calcified uterine fibroids. Other: Small, fat and fluid containing low midline ventral  hernia (series 4, image 97). Severe anasarca. Small volume ascites throughout the abdomen and pelvis. Musculoskeletal: No acute osseous findings. CT LEFT LOWER EXTREMITY FINDINGS Soft tissues: Severe, diffuse, circumferential anasarca throughout the left lower extremity. To the extent that the right lower extremity is included for comparison, this appears symmetric. No focal fluid collection. Musculoskeletal: Sarcopenia.  No acute findings. Osseous structures and joints: No fracture or dislocation. Tricompartmental arthrosis of the left knee. No knee joint effusion. Vascular: Severe, diffuse atherosclerosis and vascular calcinosis. Superficial, calcified varices throughout the thigh and calf. IMPRESSION: 1. Severe, diffuse, circumferential anasarca throughout the left lower extremity. To the extent that the right lower extremity is included for comparison, this appears symmetric. No focal fluid collection. 2. Cirrhosis with evidence of portal hypertension including mild splenomegaly, large retroperitoneal and mesenteric varices, and small volume ascites throughout the abdomen and pelvis. 3. New, although age indeterminate mild superior endplate wedge deformity of T5 with less than 25% anterior height loss. Correlate for point tenderness. 4. Coronary artery disease. Aortic Atherosclerosis (ICD10-I70.0). Electronically Signed   By: Jearld Lesch M.D.   On: 08/11/2022 09:28   CT FEMUR LEFT WO CONTRAST  Result Date: 08/11/2022 CLINICAL DATA:  Sepsis, left leg cellulitis EXAM: CT CHEST, ABDOMEN AND PELVIS WITHOUT CONTRAST CT LEFT FEMUR WITHOUT CONTRAST CT LEFT TIBIA AND FIBULA WITHOUT CONTRAST TECHNIQUE: Multidetector CT imaging of the chest, abdomen and pelvis was performed following the standard protocol without IV contrast. RADIATION DOSE REDUCTION: This exam was performed according to the departmental dose-optimization program which includes automated exposure control, adjustment of the mA and/or kV according to  patient size  and/or use of iterative reconstruction technique. COMPARISON:  04/30/2022 FINDINGS: CT CHEST FINDINGS Cardiovascular: Aortic atherosclerosis. Aortic valve calcifications. Normal heart size. Three-vessel coronary artery calcifications. No pericardial effusion. Mediastinum/Nodes: No enlarged mediastinal, hilar, or axillary lymph nodes. Thyroid gland, trachea, and esophagus demonstrate no significant findings. Lungs/Pleura: Trace left pleural effusion. Bandlike scarring or atelectasis of the lung bases. No pleural effusion or pneumothorax. Musculoskeletal: No chest wall abnormality. New, although age indeterminate mild superior endplate wedge deformity of T5 with less than 25% anterior height loss (series 12, image 126). CT ABDOMEN PELVIS FINDINGS Hepatobiliary: Coarse, nodular cirrhotic morphology of the liver. No obvious liver lesion on noncontrast CT. Status post cholecystectomy. No biliary ductal dilatation. Pancreas: Unremarkable. No pancreatic ductal dilatation or surrounding inflammatory changes. Spleen: Mild splenomegaly, maximum span 14.3 cm. Adrenals/Urinary Tract: Adrenal glands are unremarkable. Kidneys are normal, without renal calculi, solid lesion, or hydronephrosis. Bladder is unremarkable. Stomach/Bowel: Stomach is within normal limits. Status post partial right hemicolectomy and ileocolic reanastomosis. No evidence of bowel wall thickening, distention, or inflammatory changes. Vascular/Lymphatic: Aortic atherosclerosis. Large retroperitoneal and mesenteric varices (series 4, image 75, 67). No enlarged abdominal or pelvic lymph nodes. Reproductive: Calcified uterine fibroids. Other: Small, fat and fluid containing low midline ventral hernia (series 4, image 97). Severe anasarca. Small volume ascites throughout the abdomen and pelvis. Musculoskeletal: No acute osseous findings. CT LEFT LOWER EXTREMITY FINDINGS Soft tissues: Severe, diffuse, circumferential anasarca throughout the left lower  extremity. To the extent that the right lower extremity is included for comparison, this appears symmetric. No focal fluid collection. Musculoskeletal: Sarcopenia.  No acute findings. Osseous structures and joints: No fracture or dislocation. Tricompartmental arthrosis of the left knee. No knee joint effusion. Vascular: Severe, diffuse atherosclerosis and vascular calcinosis. Superficial, calcified varices throughout the thigh and calf. IMPRESSION: 1. Severe, diffuse, circumferential anasarca throughout the left lower extremity. To the extent that the right lower extremity is included for comparison, this appears symmetric. No focal fluid collection. 2. Cirrhosis with evidence of portal hypertension including mild splenomegaly, large retroperitoneal and mesenteric varices, and small volume ascites throughout the abdomen and pelvis. 3. New, although age indeterminate mild superior endplate wedge deformity of T5 with less than 25% anterior height loss. Correlate for point tenderness. 4. Coronary artery disease. Aortic Atherosclerosis (ICD10-I70.0). Electronically Signed   By: Jearld Lesch M.D.   On: 08/11/2022 09:28   CT TIBIA FIBULA LEFT WO CONTRAST  Result Date: 08/11/2022 CLINICAL DATA:  Sepsis, left leg cellulitis EXAM: CT CHEST, ABDOMEN AND PELVIS WITHOUT CONTRAST CT LEFT FEMUR WITHOUT CONTRAST CT LEFT TIBIA AND FIBULA WITHOUT CONTRAST TECHNIQUE: Multidetector CT imaging of the chest, abdomen and pelvis was performed following the standard protocol without IV contrast. RADIATION DOSE REDUCTION: This exam was performed according to the departmental dose-optimization program which includes automated exposure control, adjustment of the mA and/or kV according to patient size and/or use of iterative reconstruction technique. COMPARISON:  04/30/2022 FINDINGS: CT CHEST FINDINGS Cardiovascular: Aortic atherosclerosis. Aortic valve calcifications. Normal heart size. Three-vessel coronary artery calcifications. No  pericardial effusion. Mediastinum/Nodes: No enlarged mediastinal, hilar, or axillary lymph nodes. Thyroid gland, trachea, and esophagus demonstrate no significant findings. Lungs/Pleura: Trace left pleural effusion. Bandlike scarring or atelectasis of the lung bases. No pleural effusion or pneumothorax. Musculoskeletal: No chest wall abnormality. New, although age indeterminate mild superior endplate wedge deformity of T5 with less than 25% anterior height loss (series 12, image 126). CT ABDOMEN PELVIS FINDINGS Hepatobiliary: Coarse, nodular cirrhotic morphology of the liver. No obvious liver lesion on  noncontrast CT. Status post cholecystectomy. No biliary ductal dilatation. Pancreas: Unremarkable. No pancreatic ductal dilatation or surrounding inflammatory changes. Spleen: Mild splenomegaly, maximum span 14.3 cm. Adrenals/Urinary Tract: Adrenal glands are unremarkable. Kidneys are normal, without renal calculi, solid lesion, or hydronephrosis. Bladder is unremarkable. Stomach/Bowel: Stomach is within normal limits. Status post partial right hemicolectomy and ileocolic reanastomosis. No evidence of bowel wall thickening, distention, or inflammatory changes. Vascular/Lymphatic: Aortic atherosclerosis. Large retroperitoneal and mesenteric varices (series 4, image 75, 67). No enlarged abdominal or pelvic lymph nodes. Reproductive: Calcified uterine fibroids. Other: Small, fat and fluid containing low midline ventral hernia (series 4, image 97). Severe anasarca. Small volume ascites throughout the abdomen and pelvis. Musculoskeletal: No acute osseous findings. CT LEFT LOWER EXTREMITY FINDINGS Soft tissues: Severe, diffuse, circumferential anasarca throughout the left lower extremity. To the extent that the right lower extremity is included for comparison, this appears symmetric. No focal fluid collection. Musculoskeletal: Sarcopenia.  No acute findings. Osseous structures and joints: No fracture or dislocation.  Tricompartmental arthrosis of the left knee. No knee joint effusion. Vascular: Severe, diffuse atherosclerosis and vascular calcinosis. Superficial, calcified varices throughout the thigh and calf. IMPRESSION: 1. Severe, diffuse, circumferential anasarca throughout the left lower extremity. To the extent that the right lower extremity is included for comparison, this appears symmetric. No focal fluid collection. 2. Cirrhosis with evidence of portal hypertension including mild splenomegaly, large retroperitoneal and mesenteric varices, and small volume ascites throughout the abdomen and pelvis. 3. New, although age indeterminate mild superior endplate wedge deformity of T5 with less than 25% anterior height loss. Correlate for point tenderness. 4. Coronary artery disease. Aortic Atherosclerosis (ICD10-I70.0). Electronically Signed   By: Jearld Lesch M.D.   On: 08/11/2022 09:28   DG Chest Port 1 View  Result Date: 08/11/2022 CLINICAL DATA:  Questionable sepsis. Fatigue and shortness of breath. EXAM: PORTABLE CHEST 1 VIEW COMPARISON:  07/29/2022 FINDINGS: Stable cardiac enlargement. Low lung volumes. Scarring is noted within the left midlung. No pleural fluid, interstitial edema or airspace disease. The visualized skeletal structures are unremarkable. IMPRESSION: Low lung volumes. No active disease. Electronically Signed   By: Signa Kell M.D.   On: 08/11/2022 07:12    Labs:  CBC: Recent Labs    08/25/22 0300 08/26/22 0010 08/29/22 0249 08/30/22 0354  WBC 8.1 6.0 5.5 5.1  HGB 8.3* 8.7* 8.0* 8.2*  HCT 26.8* 27.8* 26.4* 26.4*  PLT 105* 123* 98* 87*    COAGS: Recent Labs    06/14/22 0505 08/11/22 0730 08/12/22 0325 08/12/22 1512 08/13/22 0335 08/14/22 1316 08/15/22 0552 08/27/22 0355 08/28/22 0408 08/29/22 0249 08/30/22 0354  INR 3.2* 3.4*   < > 3.4*   < > 2.1*   < > 1.7* 1.6* 1.6* 1.7*  APTT 53* 49*  --  53*  --  45*  --   --   --   --   --    < > = values in this interval not  displayed.    BMP: Recent Labs    08/27/22 0355 08/28/22 0408 08/29/22 0249 08/30/22 0354  NA 141 141 140 140  K 3.8 3.8 3.9 3.7  CL 111 112* 111 111  CO2 24 24 24 24   GLUCOSE 91 96 99 102*  BUN 31* 30* 29* 24*  CALCIUM 8.2* 8.1* 8.0* 7.8*  CREATININE 1.07* 0.97 0.99 0.94  GFRNONAA 58* >60 >60 >60    LIVER FUNCTION TESTS: Recent Labs    08/26/22 0010 08/27/22 0355 08/28/22 0408 08/29/22 0249  BILITOT  2.1* 2.1* 2.2* 2.2*  AST 23 20 25 26   ALT 19 18 18 18   ALKPHOS 120 133* 144* 145*  PROT 5.1* 5.2* 5.4* 5.2*  ALBUMIN 2.3* 2.5* 2.5* 2.4*    TUMOR MARKERS: No results for input(s): "AFPTM", "CEA", "CA199", "CHROMGRNA" in the last 8760 hours.  Assessment and Plan: 67 y.o. 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  Recent imaging has revealed acute compression fractures of T3 and T5 with marrow edema with moderate height loss at T3 and mild to moderate height loss at T5.  Despite conservative pain management measures patient continues to have moderate to severe upper back pain exacerbated with movement and with occasional radiation of pain down lower back.  Request now received from hospitalist team for consideration of vertebral body augmentation.  Imaging studies have been reviewed by Dr. Quay Burow. Risks and benefits of procedure were discussed with the patient/son including, but not limited to education regarding the natural healing process of compression fractures without intervention, bleeding, infection, cement migration which may cause spinal  cord damage, paralysis, pulmonary embolism or even death or inability to perform procedure.  This interventional procedure involves the use of X-rays and because of the nature of the planned procedure, it is possible that we will have prolonged use of X-ray fluoroscopy.  Potential radiation risks to you include (but are not limited to) the following: - A slightly elevated risk for cancer  several years later in life. This risk is typically less than 0.5% percent. This risk is low in comparison to the normal incidence of human cancer, which is 33% for women and 50% for men according to the [employer]. - Radiation induced injury can include skin redness, resembling a rash, tissue breakdown / ulcers and hair loss (which can be temporary or permanent).   The likelihood of either of these occurring depends on the difficulty of the procedure and whether you are sensitive to radiation due to previous procedures, disease, or genetic conditions.   IF your procedure requires a prolonged use of radiation, you will be notified and given written instructions for further action.  It is your responsibility to monitor the irradiated area for the 2 weeks following the procedure and to notify your physician if you are concerned that you have suffered a radiation induced injury.    All of the patient's questions were answered, patient is agreeable to proceed.  Consent signed and in chart.   Procedure tentatively scheduled for 8/21 at [facility]; pt has remote hx contrast allergy so will plan to premedicate; check am labs   Thank you for this interesting consult.  I greatly enjoyed meeting Zaela Provencher and look forward to participating in their care.  A copy of this report was sent to the requesting provider on this date.  Electronically Signed: D. Jeananne Rama, PA-C 08/30/2022, 2:43 PM   I spent a total of  25 minutes   in face to face in clinical consultation, greater than 50% of which was  counseling/coordinating care for guided T3/T5 vertebral body augmentation/kyphoplasty

## 2022-08-31 ENCOUNTER — Ambulatory Visit (HOSPITAL_COMMUNITY)
Admission: RE | Admit: 2022-08-31 | Discharge: 2022-08-31 | Disposition: A | Discharge status: Home / Self Care | Payer: Medicare (Managed Care) | Source: Ambulatory Visit | Attending: Internal Medicine | Admitting: Internal Medicine

## 2022-08-31 DIAGNOSIS — K7460 Unspecified cirrhosis of liver: Secondary | ICD-10-CM | POA: Insufficient documentation

## 2022-08-31 DIAGNOSIS — E039 Hypothyroidism, unspecified: Secondary | ICD-10-CM | POA: Insufficient documentation

## 2022-08-31 DIAGNOSIS — I509 Heart failure, unspecified: Secondary | ICD-10-CM | POA: Insufficient documentation

## 2022-08-31 DIAGNOSIS — A419 Sepsis, unspecified organism: Secondary | ICD-10-CM | POA: Diagnosis not present

## 2022-08-31 DIAGNOSIS — Z539 Procedure and treatment not carried out, unspecified reason: Secondary | ICD-10-CM | POA: Insufficient documentation

## 2022-08-31 DIAGNOSIS — I480 Paroxysmal atrial fibrillation: Secondary | ICD-10-CM | POA: Insufficient documentation

## 2022-08-31 DIAGNOSIS — E785 Hyperlipidemia, unspecified: Secondary | ICD-10-CM | POA: Insufficient documentation

## 2022-08-31 DIAGNOSIS — I110 Hypertensive heart disease with heart failure: Secondary | ICD-10-CM | POA: Insufficient documentation

## 2022-08-31 DIAGNOSIS — M4854XA Collapsed vertebra, not elsewhere classified, thoracic region, initial encounter for fracture: Secondary | ICD-10-CM | POA: Insufficient documentation

## 2022-08-31 DIAGNOSIS — R6521 Severe sepsis with septic shock: Secondary | ICD-10-CM | POA: Diagnosis not present

## 2022-08-31 HISTORY — PX: IR KYPHO THORACIC WITH BONE BIOPSY: IMG5518

## 2022-08-31 LAB — CBC
HCT: 28.7 % — ABNORMAL LOW (ref 36.0–46.0)
Hemoglobin: 8.9 g/dL — ABNORMAL LOW (ref 12.0–15.0)
MCH: 29.6 pg (ref 26.0–34.0)
MCHC: 31 g/dL (ref 30.0–36.0)
MCV: 95.3 fL (ref 80.0–100.0)
Platelets: 84 10*3/uL — ABNORMAL LOW (ref 150–400)
RBC: 3.01 MIL/uL — ABNORMAL LOW (ref 3.87–5.11)
RDW: 18.1 % — ABNORMAL HIGH (ref 11.5–15.5)
WBC: 3.8 10*3/uL — ABNORMAL LOW (ref 4.0–10.5)
nRBC: 0 % (ref 0.0–0.2)

## 2022-08-31 LAB — BASIC METABOLIC PANEL
Anion gap: 8 (ref 5–15)
BUN: 20 mg/dL (ref 8–23)
CO2: 24 mmol/L (ref 22–32)
Calcium: 8.2 mg/dL — ABNORMAL LOW (ref 8.9–10.3)
Chloride: 109 mmol/L (ref 98–111)
Creatinine, Ser: 0.95 mg/dL (ref 0.44–1.00)
GFR, Estimated: >60 mL/min (ref >60)
Glucose, Bld: 110 mg/dL — ABNORMAL HIGH (ref 70–99)
Potassium: 4.1 mmol/L (ref 3.5–5.1)
Sodium: 141 mmol/L (ref 135–145)

## 2022-08-31 LAB — PROTIME-INR
INR: 1.8 — ABNORMAL HIGH (ref 0.8–1.2)
Prothrombin Time: 20.8 s — ABNORMAL HIGH (ref 11.4–15.2)

## 2022-08-31 MED ORDER — FUROSEMIDE 10 MG/ML IJ SOLN
40.0000 mg | Freq: Two times a day (BID) | INTRAMUSCULAR | Status: DC
  Administered 2022-09-01 – 2022-09-04 (×7): 40 mg via INTRAVENOUS
  Filled 2022-08-31 (×7): qty 4

## 2022-08-31 MED ORDER — ACETAMINOPHEN 500 MG PO TABS
500.0000 mg | ORAL_TABLET | Freq: Four times a day (QID) | ORAL | Status: DC | PRN
  Administered 2022-09-03 – 2022-09-09 (×4): 500 mg via ORAL
  Filled 2022-08-31 (×4): qty 1

## 2022-08-31 MED ORDER — OXYCODONE HCL 5 MG PO TABS
5.0000 mg | ORAL_TABLET | ORAL | Status: DC | PRN
  Administered 2022-08-31 – 2022-09-01 (×3): 5 mg via ORAL
  Filled 2022-08-31 (×3): qty 1

## 2022-08-31 MED ORDER — CALCITONIN (SALMON) 200 UNIT/ACT NA SOLN
1.0000 | Freq: Every day | NASAL | Status: DC
  Administered 2022-08-31 – 2022-09-09 (×10): 1 via NASAL
  Filled 2022-08-31: qty 3.7

## 2022-08-31 NOTE — Sedation Documentation (Signed)
Patient placed in IR 2 for preliminary xray Doctor informed patient unable to perform procedure patient verbalized understanding of plan of care.

## 2022-08-31 NOTE — Progress Notes (Signed)
Occupational Therapy Treatment Patient Details Name: [name] MRN: [id number] DOB: [birth year] Today's Date: 08/31/2022   History of present illness Patient is a 67 yr old woman who presented with with fatigue and shortness of breath.  Currently being treated for septic shock due to cellulitis & UTI. PMH: NASH cirrhosis, HTN, PE, a fib, HTN, HLD   OT comments  Pt reported increased fatigue and disappointment about not having a procedure performed earlier today. As such, she gently deferred attempts at out of bed activity. She was however amendable to education on therapeutic exercises for strengthening and endurance training needed to facilitate progressive ADL performance. She performed multiple reps of therapeutic exercises at bed level with supervision. OT further educated her on the importance of maintaining regular out of bed activity, repositioning in bed,  & also reinforcement of upright positioning in bed. Continue OT plan of care.      If plan is discharge home, recommend the following:  Help with stairs or ramp for entrance;Assist for transportation;A little help with bathing/dressing/bathroom;A little help with walking and/or transfers   Equipment Recommendations  None recommended by OT    Recommendations for Other Services      Precautions / Restrictions Precautions Precautions: Fall Restrictions Weight Bearing Restrictions: No Other Position/Activity Restrictions: contact precautions       Mobility      General transfer comment: pt gently deferred, due to fatigue         ADL either performed or assessed with clinical judgement   ADL Overall ADL's : Needs assistance/impaired Eating/Feeding: Independent;Sitting Eating/Feeding Details (indicate cue type and reason): based on clinical judgement Grooming: Set up;Supervision/safety;Sitting Grooming Details (indicate cue type and reason): at chair level, based on clinical judgement         Upper Body Dressing  : Set up;Sitting Upper Body Dressing Details (indicate cue type and reason): based on clinical judgement   Lower Body Dressing Details (indicate cue type and reason): based on clinical judgement Toilet Transfer: Minimal assistance;Stand-pivot;Ambulation                             Cognition Arousal: Alert Behavior During Therapy: WFL for tasks assessed/performed Overall Cognitive Status: Within Functional Limits for tasks assessed                          Pertinent Vitals/ Pain       Pain Assessment Pain Assessment: No/denies pain         Frequency  Min 1X/week        Progress Toward Goals  OT Goals(current goals can now be found in the care plan section)     Acute Rehab OT Goals OT Goal Formulation: With patient Time For Goal Achievement: 09/02/22 Potential to Achieve Goals: Good  Plan         AM-PAC OT "6 Clicks" Daily Activity     Outcome Measure   Help from another person eating meals?: None Help from another person taking care of personal grooming?: A Little Help from another person toileting, which includes using toliet, bedpan, or urinal?: A Little Help from another person bathing (including washing, rinsing, drying)?: A Lot Help from another person to put on and taking off regular upper body clothing?: A Little Help from another person to put on and taking off regular lower body clothing?: A Lot 6 Click Score: 17    End of Session Equipment Utilized During  Treatment: Other (comment)  OT Visit Diagnosis: Muscle weakness (generalized) (M62.81);Unsteadiness on feet (R26.81)   Activity Tolerance Patient limited by fatigue   Patient Left in bed;with call bell/phone within reach;with bed alarm set   Nurse Communication Other (comment)        Time: 4098-1191 OT Time Calculation (min): 15 min  Charges: OT General Charges $OT Visit: 1 Visit OT Treatments $Therapeutic Exercise: 8-22 mins      Reuben Likes, OTR/L 08/31/2022,  5:28 PM

## 2022-08-31 NOTE — Progress Notes (Signed)
PROGRESS NOTE    [name]  ZOX:[id number] DOB: 1955/08/14 DOA: 08/11/2022 PCP: [name], MD  Chief Complaint  Patient presents with   Hypotension   Shortness of Breath    Brief Narrative:   Patient is [name] 67 y/o woman with Justyce Yeater history of cirhrosis who presented with fatigue and shortness of breath. Currently being treated for septic shock due to cellulitis with Flavobacterium.  Initially with the ICU.  Treated with pressors.  Transition to midodrine. ID was consulted on 8/4 for flava bacterium bacteremia. Palliative care was consulted on 8/8 for goals of care currently DNR. Transferred to hospitalist service on 8/9. Transferred out of the stepdown on 8/10. IV antibiotic until 8/17. Started to have vaginal bleeding on 8/12, resolved.  Assessment & Plan:   Principal Problem:   Septic shock (HCC) Active Problems:   AKI (acute kidney injury) (HCC)   Hypotension   Physical deconditioning   Goals of care, counseling/discussion   Decompensated hepatic cirrhosis (HCC)   Cellulitis of left lower extremity   Sepsis with acute renal failure, tubular necrosis, and septic shock (HCC)  Septic shock (HCC) with AKI, multifactorial Flavobacterium bacteremia, LLE cellulitis and polymicrobial UTI Concern for cat scratch fever -Met criteria on admission with lactic acidosis, encephalopathy, AKI, hypotension, tachycardia and tachypnea -Patient was admitted to ICU, was placed on pressors, fluids and IV albumin -Patient was treated with cefepime and Zithromax (for 5 days to cover cat scratch/Bartonella) -Blood cultures positive for flavobacterium. - ID was consulted, placed on meropenem for 2 weeks, completed on 08/26/2022.  Discussed with ID by my partner, does not need any further antibiotics. -CT abdomen 8/15 showed pancolitis -C. difficile PCR negative, GI pathogen panel negative   Acute on chronic HFpEF, hypertension Moderate aortic stenosis -2D echo 06/2022> EF 60-65%, G1DD,  mod AS -CT chest showed bilateral pleural effusions, patchy airspace disease in RUL and superior segment of LLL concerning for multifocal pneumonia (suspect this maybe due to volume overload with lack of respiratory symptoms or infectious symptoms at this time), difufse chest wall, body wall edema -BNP 614.9, started on Lasix 40 mg IV BID, spironolactone 25 mg daily -Strict I's and O's and daily weights  Decompensated NASH cirrhosis with ascites Coagulopathy, anemia, thrombocytopenia, anasarca H/o GIB  -Continue rifaximin,outpatient GI follow-up -Lactulose placed on prn due to diarrhea (no asterixis on exam today) -Patient appears to have anasarca with hypoalbuminemia, diuretics were held during admission due to AKI, hypotension -BNP 614.9, CT chest with bilateral pleural effusions, ascites, diffuse chest wall/body wall edema -Placed on Lasix 40 mg IV BID, continue spironolactone - titrate as tolerated   Acute on chronic back pain -Due to bacteremia, acute on chronic back pain, MRI T-spine, L-spine was obtained - motion artifact -> no evidence of discitis or osteomyelitis -> acute compression fractures of T3 and T5 vertebral bodies with marrow edema, moderate height loss at T3, mild to moderate height loss at T5 -planned for kyphoplasty today, this was canceled (due to weight?, note pending from IR) -will add on calcitonin. APAP prn, lidocaine patch.  Oxycodone prn.     AKI with uremia, metabolic acidosis, - no obstruction or hydronephrosis on CT imaging, presumed due to septic shock  - Treated with IV pressors, IV fluids.  - Renal function has improved   Coagulopathy. Postmenopausal vaginal bleeding. Macrocytic anemia with acute on chronic blood loss anemia - Due to cirrhosis her INR has remained elevated. - Patient was started on heparin in the ICU for DVT prophylaxis,  subsequently started having postmenopausal vaginal bleeding. -Received Vitamin K x 1 on 8/12 x 5 days.  Heparin held,  CT abdomen did not show any RPH -Pelvic ultrasound showed myomatous uterus with several fibroids - Dr Allena Katz discussed with hematology and GYN on-call, received tranexamic acid twice daily, I d/w Dr Debroah Loop (GYN on-call), on 8/15 recommended Megace 40 mg p.o. twice daily for 2 weeks, will arrange outpatient follow-up at OB/GYN center for further evaluation outpatient. -Vaginal bleeding has resolved.   Physical deconditioning -PT evaluation   Hypothyroidism -Continue synthroid   Depression -Continue effexor    Headache. No focal deficits. Continue Tylenol.   Chronic right eye pain on movement towards medial side. Resolved. No red flag sign. Needs outpatient follow up   Chronic left ear pain. Outpatient follow-up with ENT recommended.   Right thyroid nodule 1.5 cm -Incidental seen on CT chest, recommend nonemergent thyroid ultrasound   Obesity Body mass index is 52.46 kg/m.      DVT prophylaxis: SCD Code Status: DNR Family Communication: son Disposition:   Status is: Inpatient Remains inpatient appropriate because: continued need for inpatient care   Consultants:  CCM ID gyn  Procedures:  none  Antimicrobials:  Anti-infectives (From admission, onward)    Start     Dose/Rate Route Frequency Ordered Stop   08/31/22 1030  vancomycin (VANCOREADY) IVPB 1500 mg/300 mL        1,500 mg 150 mL/hr over 120 Minutes Intravenous To Radiology 08/30/22 2001 09/01/22 1030   08/20/22 1200  meropenem (MERREM) 1 g in sodium chloride 0.9 % 100 mL IVPB  Status:  Discontinued        1 g 200 mL/hr over 30 Minutes Intravenous Every 8 hours 08/20/22 1101 08/26/22 1453   08/14/22 1800  azithromycin (ZITHROMAX) 250 mg in dextrose 5 % 125 mL IVPB  Status:  Discontinued        250 mg 127.5 mL/hr over 60 Minutes Intravenous Daily 08/14/22 1738 08/14/22 1752   08/14/22 1800  azithromycin (ZITHROMAX) 500 mg in sodium chloride 0.9 % 250 mL IVPB  Status:  Discontinued        500 mg 250  mL/hr over 60 Minutes Intravenous Daily 08/14/22 1752 08/15/22 1054   08/14/22 1500  meropenem (MERREM) 1 g in sodium chloride 0.9 % 100 mL IVPB  Status:  Discontinued        1 g 200 mL/hr over 30 Minutes Intravenous Every 12 hours 08/14/22 1354 08/20/22 1101   08/13/22 2100  ceFEPIme (MAXIPIME) 2 g in sodium chloride 0.9 % 100 mL IVPB  Status:  Discontinued        2 g 200 mL/hr over 30 Minutes Intravenous Every 24 hours 08/13/22 0436 08/13/22 1944   08/13/22 2100  Cefiderocol Sulfate Tosylate (FETROJA) 1 g in sodium chloride 0.9 % 100 mL IVPB  Status:  Discontinued        1 g 37.1 mL/hr over 3 Hours Intravenous Every 8 hours 08/13/22 1944 08/14/22 1353   08/13/22 0800  vancomycin (VANCOREADY) IVPB 1250 mg/250 mL  Status:  Discontinued        1,250 mg 166.7 mL/hr over 90 Minutes Intravenous Every 48 hours 08/11/22 1415 08/12/22 0717   08/12/22 2200  ceFEPIme (MAXIPIME) 2 g in sodium chloride 0.9 % 100 mL IVPB  Status:  Discontinued        2 g 200 mL/hr over 30 Minutes Intravenous Every 12 hours 08/12/22 1743 08/13/22 0436   08/12/22 1000  azithromycin (ZITHROMAX) tablet 250  mg  Status:  Discontinued       Placed in "Followed by" Linked Group   250 mg Oral Daily 08/11/22 1353 08/14/22 1738   08/11/22 2000  ceFEPIme (MAXIPIME) 2 g in sodium chloride 0.9 % 100 mL IVPB  Status:  Discontinued        2 g 200 mL/hr over 30 Minutes Intravenous Every 12 hours 08/11/22 1415 08/12/22 1743   08/11/22 1500  rifaximin (XIFAXAN) tablet 550 mg        550 mg Oral 2 times daily 08/11/22 1413     08/11/22 1445  azithromycin (ZITHROMAX) tablet 500 mg       Placed in "Followed by" Linked Group   500 mg Oral  Once 08/11/22 1353 08/11/22 1501   08/11/22 0745  vancomycin (VANCOCIN) IVPB 1000 mg/200 mL premix  Status:  Discontinued        1,000 mg 200 mL/hr over 60 Minutes Intravenous  Once 08/11/22 0739 08/11/22 0740   08/11/22 0745  clindamycin (CLEOCIN) IVPB 600 mg        600 mg 100 mL/hr over 30 Minutes  Intravenous  Once 08/11/22 0739 08/11/22 1004   08/11/22 0745  vancomycin (VANCOREADY) IVPB 2000 mg/400 mL        2,000 mg 200 mL/hr over 120 Minutes Intravenous STAT 08/11/22 0740 08/11/22 1030   08/11/22 0745  meropenem (MERREM) 1 g in sodium chloride 0.9 % 100 mL IVPB        1 g 200 mL/hr over 30 Minutes Intravenous STAT 08/11/22 0740 08/11/22 0914       Subjective: No new complaints  Objective: Vitals:   08/30/22 1211 08/30/22 2043 08/31/22 0523 08/31/22 0707  BP: 132/86 106/66 136/74   Pulse: 88 74 93   Resp: 19 18 20    Temp: 98.1 F (36.7 C) 98 F (36.7 C) 98.4 F (36.9 C)   TempSrc:   Oral   SpO2: 93% 99% 95%   Weight:    130.1 kg  Height:        Intake/Output Summary (Last 24 hours) at 08/31/2022 1836 Last data filed at 08/31/2022 0500 Gross per 24 hour  Intake 150 ml  Output 1550 ml  Net -1400 ml   Filed Weights   08/18/22 0313 08/19/22 0500 08/31/22 0707  Weight: 118.4 kg 130.1 kg 130.1 kg    Examination:  General exam: Appears calm and comfortable  Respiratory system: unlabored Cardiovascular system: RRR Gastrointestinal system: mildly diffusely ttp  Central nervous system: Alert and oriented. No focal neurological deficits. Extremities: bilateral Le edema, ansarca, edema noted to breast   Data Reviewed: I have personally reviewed following labs and imaging studies  CBC: Recent Labs  Lab 08/25/22 0300 08/26/22 0010 08/29/22 0249 08/30/22 0354 08/31/22 0128  WBC 8.1 6.0 5.5 5.1 3.8*  HGB 8.3* 8.7* 8.0* 8.2* 8.9*  HCT 26.8* 27.8* 26.4* 26.4* 28.7*  MCV 98.5 97.9 97.4 97.8 95.3  PLT 105* 123* 98* 87* 84*    Basic Metabolic Panel: Recent Labs  Lab 08/25/22 0300 08/26/22 0010 08/27/22 0355 08/28/22 0408 08/29/22 0249 08/30/22 0354 08/31/22 0128  NA 141 137 141 141 140 140 141  K 3.5 4.0 3.8 3.8 3.9 3.7 4.1  CL 112* 109 111 112* 111 111 109  CO2 23 21* 24 24 24 24 24   GLUCOSE 119* 151* 91 96 99 102* 110*  BUN 31* 29* 31* 30* 29*  24* 20  CREATININE 1.05* 1.01* 1.07* 0.97 0.99 0.94 0.95  CALCIUM 8.0* 8.0*  8.2* 8.1* 8.0* 7.8* 8.2*  MG 1.5* 2.0  --   --   --   --   --     GFR: Estimated Creatinine Clearance: 77.5 mL/min (by C-G formula based on SCr of 0.95 mg/dL).  Liver Function Tests: Recent Labs  Lab 08/26/22 0010 08/27/22 0355 08/28/22 0408 08/29/22 0249  AST 23 20 25 26   ALT 19 18 18 18   ALKPHOS 120 133* 144* 145*  BILITOT 2.1* 2.1* 2.2* 2.2*  PROT 5.1* 5.2* 5.4* 5.2*  ALBUMIN 2.3* 2.5* 2.5* 2.4*    CBG: No results for input(s): "GLUCAP" in the last 168 hours.   Recent Results (from the past 240 hour(s))  C Difficile Quick Screen (NO PCR Reflex)     Status: None   Collection Time: 08/26/22  8:12 PM   Specimen: STOOL  Result Value Ref Range Status   C Diff antigen NEGATIVE NEGATIVE Final   C Diff toxin NEGATIVE NEGATIVE Final   C Diff interpretation No C. difficile detected.  Final    Comment: Performed at [name], [address] [place], [place] [address]  Gastrointestinal Panel by PCR , Stool     Status: None   Collection Time: 08/26/22  8:12 PM   Specimen: Stool  Result Value Ref Range Status   Campylobacter species NOT DETECTED NOT DETECTED Final   Plesimonas shigelloides NOT DETECTED NOT DETECTED Final   Salmonella species NOT DETECTED NOT DETECTED Final   Yersinia enterocolitica NOT DETECTED NOT DETECTED Final   Vibrio species NOT DETECTED NOT DETECTED Final   Vibrio cholerae NOT DETECTED NOT DETECTED Final   Enteroaggregative E coli (EAEC) NOT DETECTED NOT DETECTED Final   Enteropathogenic E coli (EPEC) NOT DETECTED NOT DETECTED Final   Enterotoxigenic E coli (ETEC) NOT DETECTED NOT DETECTED Final   Shiga like toxin producing E coli (STEC) NOT DETECTED NOT DETECTED Final   Shigella/Enteroinvasive E coli (EIEC) NOT DETECTED NOT DETECTED Final   Cryptosporidium NOT DETECTED NOT DETECTED Final   Cyclospora cayetanensis NOT DETECTED NOT DETECTED Final    Entamoeba histolytica NOT DETECTED NOT DETECTED Final   Giardia lamblia NOT DETECTED NOT DETECTED Final   Adenovirus F40/41 NOT DETECTED NOT DETECTED Final   Astrovirus NOT DETECTED NOT DETECTED Final   Norovirus GI/GII NOT DETECTED NOT DETECTED Final   Rotavirus Beverlee Wilmarth NOT DETECTED NOT DETECTED Final   Sapovirus (I, II, IV, and V) NOT DETECTED NOT DETECTED Final    Comment: Performed at [facility], [address], [place], [place] [address]         Radiology Studies: No results found.      Scheduled Meds:  dicyclomine  10 mg Oral TID AC   diphenhydrAMINE  50 mg Oral Once   famotidine  20 mg Oral QAC breakfast   feeding supplement (KATE FARMS STANDARD 1.4)  325 mL Oral Daily   furosemide  40 mg Intravenous Daily   ketotifen  1 drop Right Eye BID   levothyroxine  150 mcg Oral QAC breakfast   lidocaine  2 patch Transdermal Q24H   magic mouthwash  5 mL Oral QID   megestrol  40 mg Oral BID   midodrine  10 mg Oral TID WC   nystatin cream   Topical BID   pantoprazole  40 mg Oral BID AC   rifaximin  550 mg Oral BID   rOPINIRole  0.25 mg Oral QHS   sodium chloride flush  10-40 mL Intracatheter Q12H   spironolactone  25 mg Oral Daily   tamsulosin  0.4 mg Oral Daily   venlafaxine XR  75 mg Oral Q breakfast   zinc sulfate  220 mg Oral Daily   Continuous Infusions:  sodium chloride 250 mL (08/21/22 1447)   vancomycin       LOS: 20 days    Time spent: over 30 min    Lacretia Nicks, MD [facility]   To contact the attending provider between 7A-7P or the covering provider during after hours 7P-7A, please log into the web site [email or web address] and access using universal [place] password for that web site. If you do not have the password, please call the hospital operator.  08/31/2022, 6:36 PM

## 2022-08-31 NOTE — Progress Notes (Signed)
Chaplain responded to [name]'s request for prayer prior to procedure.  [name] and her son, [name], shared about her health journey and the miracles that they have seen.  She is particularly scared this time prior to procedure and wanted to have a prayer.  Chaplain provided emotional support as well as grief support (as she shared about tomorrow being the 4th anniversary of her husband's death) and spiritual support through prayer and compassionate presence.    [address] Bcc Pager, [phone]

## 2022-08-31 NOTE — Progress Notes (Signed)
Nutrition Follow-up  DOCUMENTATION CODES:   Morbid obesity  INTERVENTION:   -[employer] 1.4 PO daily, each provides 455 kcals and 20g protein  NUTRITION DIAGNOSIS:   Inadequate oral intake related to acute illness, lethargy/confusion as evidenced by energy intake < or equal to 50% for > or equal to 5 days.  Ongoing.  GOAL:   Patient will meet greater than or equal to 90% of their needs  Not met.  MONITOR:   PO intake, Supplement acceptance, Labs, Weight trends, I & O's  ASSESSMENT:   67 year old female with PMH NASH cirrhosis, HE, morbid obesity, and MDR UTI (e. 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 Found to have multifactorial shock.  8/1: Admit 8/5: NGT placed 8/7: SLP eval-Reg diet; NGT removed  Has not been accepting Molli Posey for the past 3 days. NPO today for kyphoplasty. At [facility] for procedure. When on a diet, pt consuming 25-100% of meals.  Admission weight: 235 lbs Current weight: 286 lbs (?) -likely copied over form 8/9 as exact weight as then. Needs updated weight when returns.   Medications: Bentyl, Pepcid  Labs reviewed.  Diet Order:   Diet Order             Diet NPO time specified Except for: Sips with Meds  Diet effective midnight                   EDUCATION NEEDS:   Education needs have been addressed  Skin:  Skin Assessment: Reviewed RN Assessment Skin Integrity Issues:: Other (Comment) Other: Skin tear right buttocks  Last BM:  8/20  Height:   Ht Readings from Last 1 Encounters:  08/11/22 5\' 2"  (1.575 m)    Weight:   Wt Readings from Last 1 Encounters:  08/31/22 130.1 kg    BMI:  Body mass index is 52.46 kg/m.  Estimated Nutritional Needs:   Kcal:  1750-2000 kcals  Protein:  75-100 grams  Fluid:  >/= 1.8L   Tilda Franco, MS, RD, LDN Inpatient  Clinical Dietitian Contact information available via Amion

## 2022-09-01 DIAGNOSIS — R6521 Severe sepsis with septic shock: Secondary | ICD-10-CM | POA: Diagnosis not present

## 2022-09-01 DIAGNOSIS — A419 Sepsis, unspecified organism: Secondary | ICD-10-CM | POA: Diagnosis not present

## 2022-09-01 LAB — CBC WITH DIFFERENTIAL/PLATELET
Abs Immature Granulocytes: 0.01 10*3/uL (ref 0.00–0.07)
Basophils Absolute: 0 10*3/uL (ref 0.0–0.1)
Basophils Relative: 0 %
Eosinophils Absolute: 0 10*3/uL (ref 0.0–0.5)
Eosinophils Relative: 0 %
HCT: 28.2 % — ABNORMAL LOW (ref 36.0–46.0)
Hemoglobin: 8.7 g/dL — ABNORMAL LOW (ref 12.0–15.0)
Immature Granulocytes: 0 %
Lymphocytes Relative: 19 %
Lymphs Abs: 1.2 10*3/uL (ref 0.7–4.0)
MCH: 30 pg (ref 26.0–34.0)
MCHC: 30.9 g/dL (ref 30.0–36.0)
MCV: 97.2 fL (ref 80.0–100.0)
Monocytes Absolute: 0.7 10*3/uL (ref 0.1–1.0)
Monocytes Relative: 11 %
Neutro Abs: 4.6 10*3/uL (ref 1.7–7.7)
Neutrophils Relative %: 70 %
Platelets: 111 10*3/uL — ABNORMAL LOW (ref 150–400)
RBC: 2.9 MIL/uL — ABNORMAL LOW (ref 3.87–5.11)
RDW: 18.2 % — ABNORMAL HIGH (ref 11.5–15.5)
WBC: 6.6 10*3/uL (ref 4.0–10.5)
nRBC: 0 % (ref 0.0–0.2)

## 2022-09-01 LAB — PROTIME-INR
INR: 1.7 — ABNORMAL HIGH (ref 0.8–1.2)
Prothrombin Time: 20.3 s — ABNORMAL HIGH (ref 11.4–15.2)

## 2022-09-01 LAB — COMPREHENSIVE METABOLIC PANEL
ALT: 19 U/L (ref 0–44)
AST: 23 U/L (ref 15–41)
Albumin: 2.4 g/dL — ABNORMAL LOW (ref 3.5–5.0)
Alkaline Phosphatase: 172 U/L — ABNORMAL HIGH (ref 38–126)
Anion gap: 6 (ref 5–15)
BUN: 24 mg/dL — ABNORMAL HIGH (ref 8–23)
CO2: 23 mmol/L (ref 22–32)
Calcium: 7.9 mg/dL — ABNORMAL LOW (ref 8.9–10.3)
Chloride: 110 mmol/L (ref 98–111)
Creatinine, Ser: 1.09 mg/dL — ABNORMAL HIGH (ref 0.44–1.00)
GFR, Estimated: 57 mL/min — ABNORMAL LOW (ref >60)
Glucose, Bld: 160 mg/dL — ABNORMAL HIGH (ref 70–99)
Potassium: 3.6 mmol/L (ref 3.5–5.1)
Sodium: 139 mmol/L (ref 135–145)
Total Bilirubin: 2 mg/dL — ABNORMAL HIGH (ref 0.3–1.2)
Total Protein: 5.3 g/dL — ABNORMAL LOW (ref 6.5–8.1)

## 2022-09-01 LAB — PHOSPHORUS: Phosphorus: 3.5 mg/dL (ref 2.5–4.6)

## 2022-09-01 LAB — MAGNESIUM: Magnesium: 1.5 mg/dL — ABNORMAL LOW (ref 1.7–2.4)

## 2022-09-01 MED ORDER — MAGNESIUM SULFATE 4 GM/100ML IV SOLN
4.0000 g | Freq: Once | INTRAVENOUS | Status: AC
  Administered 2022-09-01: 4 g via INTRAVENOUS
  Filled 2022-09-01: qty 100

## 2022-09-01 MED ORDER — OXYCODONE HCL 5 MG PO TABS
2.5000 mg | ORAL_TABLET | ORAL | Status: DC | PRN
  Administered 2022-09-01 – 2022-09-05 (×11): 5 mg via ORAL
  Filled 2022-09-01 (×11): qty 1

## 2022-09-01 MED ORDER — HYDROXYZINE HCL 25 MG PO TABS
50.0000 mg | ORAL_TABLET | Freq: Every evening | ORAL | Status: DC | PRN
  Administered 2022-09-02: 50 mg via ORAL
  Filled 2022-09-01: qty 2

## 2022-09-01 MED ORDER — MAGNESIUM SULFATE 2 GM/50ML IV SOLN
INTRAVENOUS | Status: AC
  Filled 2022-09-01: qty 50

## 2022-09-01 NOTE — Care Management Important Message (Signed)
Important Message  Patient Details IM Letter given. Name: [name] MRN: [id number] Date of Birth: [birth year]   Medicare Important Message Given:  Yes     [name] 09/01/2022, 11:22 AM

## 2022-09-01 NOTE — Progress Notes (Signed)
PROGRESS NOTE    [name]  HKV:[id number] DOB: 11-09-1955 DOA: 08/11/2022 PCP: [name], MD  Chief Complaint  Patient presents with   Hypotension   Shortness of Breath    Brief Narrative:   Patient is [name] 67 y/o woman with [place] history of cirhrosis who presented with fatigue and shortness of breath. Currently being treated for septic shock due to cellulitis with Flavobacterium.  Initially with the ICU.  Treated with pressors.  Transition to midodrine. ID was consulted on 8/4 for flava bacterium bacteremia. Palliative care was consulted on 8/8 for goals of care currently DNR. Transferred to hospitalist service on 8/9. Transferred out of the stepdown on 8/10. IV antibiotic until 8/17. Started to have vaginal bleeding on 8/12, resolved.  Assessment & Plan:   Principal Problem:   Septic shock (HCC) Active Problems:   AKI (acute kidney injury) (HCC)   Hypotension   Physical deconditioning   Goals of care, counseling/discussion   Decompensated hepatic cirrhosis (HCC)   Cellulitis of left lower extremity   Sepsis with acute renal failure, tubular necrosis, and septic shock (HCC)  Septic shock (HCC) with AKI, multifactorial Flavobacterium bacteremia, LLE cellulitis and polymicrobial UTI Concern for cat scratch fever -Met criteria on admission with lactic acidosis, encephalopathy, AKI, hypotension, tachycardia and tachypnea -Patient was admitted to ICU, was placed on pressors, fluids and IV albumin -Patient was treated with cefepime and Zithromax (for 5 days to cover cat scratch/Bartonella) -Blood cultures positive for flavobacterium. - ID was consulted, placed on meropenem for 2 weeks, completed on 08/26/2022.  Discussed with ID by my partner, does not need any further antibiotics. -CT abdomen 8/15 showed pancolitis -C. difficile PCR negative, GI pathogen panel negative   Acute on chronic HFpEF, hypertension Moderate aortic stenosis -2D echo 06/2022> EF 60-65%, G1DD,  mod AS -CT chest showed bilateral pleural effusions, patchy airspace disease in RUL and superior segment of LLL concerning for multifocal pneumonia (suspect this maybe due to volume overload with lack of respiratory symptoms or infectious symptoms at this time), difufse chest wall, body wall edema -BNP 614.9, started on Lasix 40 mg IV BID, spironolactone 25 mg daily -Strict I's and O's and daily weights  Decompensated NASH cirrhosis with ascites Coagulopathy, anemia, thrombocytopenia, anasarca H/o GIB  -Continue rifaximin,outpatient GI follow-up -Lactulose placed on prn due to diarrhea (no asterixis on exam today) -Patient appears to have anasarca with hypoalbuminemia, diuretics were held during admission due to AKI, hypotension -BNP 614.9, CT chest with bilateral pleural effusions, ascites, diffuse chest wall/body wall edema -Placed on Lasix 40 mg IV BID, continue spironolactone - titrate as tolerated  -she has abdominal discomfort, r/o SBP -> paracentesis ordered  Acute on chronic back pain -Due to bacteremia, acute on chronic back pain, MRI T-spine, L-spine was obtained - motion artifact -> no evidence of discitis or osteomyelitis -> acute compression fractures of T3 and T5 vertebral bodies with marrow edema, moderate height loss at T3, mild to moderate height loss at T5 -planned for kyphoplasty today, this was canceled (due to weight?, note pending from IR) -will add on calcitonin. APAP prn, lidocaine patch.  Oxycodone prn.     AKI with uremia, metabolic acidosis, - no obstruction or hydronephrosis on CT imaging, presumed due to septic shock  - Treated with IV pressors, IV fluids.  - Renal function has improved - follow on lasix    Coagulopathy. Postmenopausal vaginal bleeding. Macrocytic anemia with acute on chronic blood loss anemia - Due to cirrhosis her INR has  remained elevated. - Patient was started on heparin in the ICU for DVT prophylaxis, subsequently started having  postmenopausal vaginal bleeding. -Received Vitamin K x 1 on 8/12 x 5 days.  Heparin held, CT abdomen did not show any RPH -Pelvic ultrasound showed myomatous uterus with several fibroids - Dr Allena Katz discussed with hematology and GYN on-call, received tranexamic acid twice daily, I d/w Dr Debroah Loop (GYN on-call), on 8/15 recommended Megace 40 mg p.o. twice daily for 2 weeks, will arrange outpatient follow-up at OB/GYN center for further evaluation outpatient. -Vaginal bleeding has resolved.   Physical deconditioning -PT evaluation   Hypothyroidism -Continue synthroid   Depression -Continue effexor    Headache. No focal deficits. Continue Tylenol.   Chronic right eye pain on movement towards medial side. Resolved. No red flag sign. Needs outpatient follow up   Chronic left ear pain. Outpatient follow-up with ENT recommended.   Right thyroid nodule 1.5 cm -Incidental seen on CT chest, recommend nonemergent thyroid ultrasound   Obesity Body mass index is 66.9 kg/m.      DVT prophylaxis: SCD Code Status: DNR Family Communication: son 8/21 Disposition:   Status is: Inpatient Remains inpatient appropriate because: continued need for inpatient care   Consultants:  CCM ID gyn  Procedures:  none  Antimicrobials:  Anti-infectives (From admission, onward)    Start     Dose/Rate Route Frequency Ordered Stop   08/31/22 1030  vancomycin (VANCOREADY) IVPB 1500 mg/300 mL        1,500 mg 150 mL/hr over 120 Minutes Intravenous To Radiology 08/30/22 2001 09/01/22 1030   08/20/22 1200  meropenem (MERREM) 1 g in sodium chloride 0.9 % 100 mL IVPB  Status:  Discontinued        1 g 200 mL/hr over 30 Minutes Intravenous Every 8 hours 08/20/22 1101 08/26/22 1453   08/14/22 1800  azithromycin (ZITHROMAX) 250 mg in dextrose 5 % 125 mL IVPB  Status:  Discontinued        250 mg 127.5 mL/hr over 60 Minutes Intravenous Daily 08/14/22 1738 08/14/22 1752   08/14/22 1800  azithromycin  (ZITHROMAX) 500 mg in sodium chloride 0.9 % 250 mL IVPB  Status:  Discontinued        500 mg 250 mL/hr over 60 Minutes Intravenous Daily 08/14/22 1752 08/15/22 1054   08/14/22 1500  meropenem (MERREM) 1 g in sodium chloride 0.9 % 100 mL IVPB  Status:  Discontinued        1 g 200 mL/hr over 30 Minutes Intravenous Every 12 hours 08/14/22 1354 08/20/22 1101   08/13/22 2100  ceFEPIme (MAXIPIME) 2 g in sodium chloride 0.9 % 100 mL IVPB  Status:  Discontinued        2 g 200 mL/hr over 30 Minutes Intravenous Every 24 hours 08/13/22 0436 08/13/22 1944   08/13/22 2100  Cefiderocol Sulfate Tosylate (FETROJA) 1 g in sodium chloride 0.9 % 100 mL IVPB  Status:  Discontinued        1 g 37.1 mL/hr over 3 Hours Intravenous Every 8 hours 08/13/22 1944 08/14/22 1353   08/13/22 0800  vancomycin (VANCOREADY) IVPB 1250 mg/250 mL  Status:  Discontinued        1,250 mg 166.7 mL/hr over 90 Minutes Intravenous Every 48 hours 08/11/22 1415 08/12/22 0717   08/12/22 2200  ceFEPIme (MAXIPIME) 2 g in sodium chloride 0.9 % 100 mL IVPB  Status:  Discontinued        2 g 200 mL/hr over 30 Minutes Intravenous Every  12 hours 08/12/22 1743 08/13/22 0436   08/12/22 1000  azithromycin (ZITHROMAX) tablet 250 mg  Status:  Discontinued       Placed in "Followed by" Linked Group   250 mg Oral Daily 08/11/22 1353 08/14/22 1738   08/11/22 2000  ceFEPIme (MAXIPIME) 2 g in sodium chloride 0.9 % 100 mL IVPB  Status:  Discontinued        2 g 200 mL/hr over 30 Minutes Intravenous Every 12 hours 08/11/22 1415 08/12/22 1743   08/11/22 1500  rifaximin (XIFAXAN) tablet 550 mg        550 mg Oral 2 times daily 08/11/22 1413     08/11/22 1445  azithromycin (ZITHROMAX) tablet 500 mg       Placed in "Followed by" Linked Group   500 mg Oral  Once 08/11/22 1353 08/11/22 1501   08/11/22 0745  vancomycin (VANCOCIN) IVPB 1000 mg/200 mL premix  Status:  Discontinued        1,000 mg 200 mL/hr over 60 Minutes Intravenous  Once 08/11/22 0739 08/11/22  0740   08/11/22 0745  clindamycin (CLEOCIN) IVPB 600 mg        600 mg 100 mL/hr over 30 Minutes Intravenous  Once 08/11/22 0739 08/11/22 1004   08/11/22 0745  vancomycin (VANCOREADY) IVPB 2000 mg/400 mL        2,000 mg 200 mL/hr over 120 Minutes Intravenous STAT 08/11/22 0740 08/11/22 1030   08/11/22 0745  meropenem (MERREM) 1 g in sodium chloride 0.9 % 100 mL IVPB        1 g 200 mL/hr over 30 Minutes Intravenous STAT 08/11/22 0740 08/11/22 0914       Subjective: No new complaints  Objective: Vitals:   08/31/22 0523 08/31/22 0707 08/31/22 2019 09/01/22 0635  BP: 136/74  118/61   Pulse: 93  83   Resp: 20  20   Temp: 98.4 F (36.9 C)  97.6 F (36.4 C) 98.6 F (37 C)  TempSrc: Oral  Oral Oral  SpO2: 95%  97%   Weight:  130.1 kg  (!) 165.9 kg  Height:        Intake/Output Summary (Last 24 hours) at 09/01/2022 1738 Last data filed at 08/31/2022 2032 Gross per 24 hour  Intake 240 ml  Output 525 ml  Net -285 ml   Filed Weights   08/19/22 0500 08/31/22 0707 09/01/22 0635  Weight: 130.1 kg 130.1 kg (!) 165.9 kg    Examination:  General: No acute distress. Cardiovascular: RRR Lungs: unlabored Abdomen: diffuse abdominal TTP  Neurological: Alert and oriented 3. No asterixis.  Moves all extremities 4 with equal strength. Cranial nerves II through XII grossly intact. Extremities: edema, improved   Data Reviewed: I have personally reviewed following labs and imaging studies  CBC: Recent Labs  Lab 08/26/22 0010 08/29/22 0249 08/30/22 0354 08/31/22 0128 09/01/22 0523  WBC 6.0 5.5 5.1 3.8* 6.6  NEUTROABS  --   --   --   --  4.6  HGB 8.7* 8.0* 8.2* 8.9* 8.7*  HCT 27.8* 26.4* 26.4* 28.7* 28.2*  MCV 97.9 97.4 97.8 95.3 97.2  PLT 123* 98* 87* 84* 111*    Basic Metabolic Panel: Recent Labs  Lab 08/26/22 0010 08/27/22 0355 08/28/22 0408 08/29/22 0249 08/30/22 0354 08/31/22 0128 09/01/22 0523  NA 137   < > 141 140 140 141 139  K 4.0   < > 3.8 3.9 3.7 4.1 3.6   CL 109   < > 112* 111 111  109 110  CO2 21*   < > 24 24 24 24 23   GLUCOSE 151*   < > 96 99 102* 110* 160*  BUN 29*   < > 30* 29* 24* 20 24*  CREATININE 1.01*   < > 0.97 0.99 0.94 0.95 1.09*  CALCIUM 8.0*   < > 8.1* 8.0* 7.8* 8.2* 7.9*  MG 2.0  --   --   --   --   --  1.5*  PHOS  --   --   --   --   --   --  3.5   < > = values in this interval not displayed.    GFR: Estimated Creatinine Clearance: 79.4 mL/min ([name]) (by C-G formula based on SCr of 1.09 mg/dL (H)).  Liver Function Tests: Recent Labs  Lab 08/26/22 0010 08/27/22 0355 08/28/22 0408 08/29/22 0249 09/01/22 0523  AST 23 20 25 26 23   ALT 19 18 18 18 19   ALKPHOS 120 133* 144* 145* 172*  BILITOT 2.1* 2.1* 2.2* 2.2* 2.0*  PROT 5.1* 5.2* 5.4* 5.2* 5.3*  ALBUMIN 2.3* 2.5* 2.5* 2.4* 2.4*    CBG: No results for input(s): "GLUCAP" in the last 168 hours.   Recent Results (from the past 240 hour(s))  C Difficile Quick Screen (NO PCR Reflex)     Status: None   Collection Time: 08/26/22  8:12 PM   Specimen: STOOL  Result Value Ref Range Status   C Diff antigen NEGATIVE NEGATIVE Final   C Diff toxin NEGATIVE NEGATIVE Final   C Diff interpretation No C. difficile detected.  Final    Comment: Performed at [facility], [address] [place], [place] [address]  Gastrointestinal Panel by PCR , Stool     Status: None   Collection Time: 08/26/22  8:12 PM   Specimen: Stool  Result Value Ref Range Status   Campylobacter species NOT DETECTED NOT DETECTED Final   Plesimonas shigelloides NOT DETECTED NOT DETECTED Final   Salmonella species NOT DETECTED NOT DETECTED Final   Yersinia enterocolitica NOT DETECTED NOT DETECTED Final   Vibrio species NOT DETECTED NOT DETECTED Final   Vibrio cholerae NOT DETECTED NOT DETECTED Final   Enteroaggregative E coli (EAEC) NOT DETECTED NOT DETECTED Final   Enteropathogenic E coli (EPEC) NOT DETECTED NOT DETECTED Final   Enterotoxigenic E coli (ETEC) NOT DETECTED NOT  DETECTED Final   Shiga like toxin producing E coli (STEC) NOT DETECTED NOT DETECTED Final   Shigella/Enteroinvasive E coli (EIEC) NOT DETECTED NOT DETECTED Final   Cryptosporidium NOT DETECTED NOT DETECTED Final   Cyclospora cayetanensis NOT DETECTED NOT DETECTED Final   Entamoeba histolytica NOT DETECTED NOT DETECTED Final   Giardia lamblia NOT DETECTED NOT DETECTED Final   Adenovirus F40/41 NOT DETECTED NOT DETECTED Final   Astrovirus NOT DETECTED NOT DETECTED Final   Norovirus GI/GII NOT DETECTED NOT DETECTED Final   Rotavirus Lauretta Sallas NOT DETECTED NOT DETECTED Final   Sapovirus (I, II, IV, and V) NOT DETECTED NOT DETECTED Final    Comment: Performed at [facility], [address], [place], [place] [address]         Radiology Studies: No results found.      Scheduled Meds:  calcitonin (salmon)  1 spray Alternating Nares Daily   dicyclomine  10 mg Oral TID AC   diphenhydrAMINE  50 mg Oral Once   famotidine  20 mg Oral QAC breakfast   feeding supplement (KATE FARMS STANDARD 1.4)  325  mL Oral Daily   furosemide  40 mg Intravenous BID   ketotifen  1 drop Right Eye BID   levothyroxine  150 mcg Oral QAC breakfast   lidocaine  2 patch Transdermal Q24H   magic mouthwash  5 mL Oral QID   megestrol  40 mg Oral BID   midodrine  10 mg Oral TID WC   nystatin cream   Topical BID   pantoprazole  40 mg Oral BID AC   rifaximin  550 mg Oral BID   rOPINIRole  0.25 mg Oral QHS   sodium chloride flush  10-40 mL Intracatheter Q12H   spironolactone  25 mg Oral Daily   tamsulosin  0.4 mg Oral Daily   venlafaxine XR  75 mg Oral Q breakfast   zinc sulfate  220 mg Oral Daily   Continuous Infusions:  sodium chloride 250 mL (08/21/22 1447)     LOS: 21 days    Time spent: over 30 min    Lacretia Nicks, MD [facility]   To contact the attending provider between 7A-7P or the covering provider during after hours 7P-7A, please log into the web site [email or web address] and  access using universal [place] password for that web site. If you do not have the password, please call the hospital operator.  09/01/2022, 5:38 PM

## 2022-09-01 NOTE — Plan of Care (Signed)

## 2022-09-01 NOTE — Progress Notes (Signed)
Patient pulled PICC line out. IV team was notified. No bleeding or skin injury. Page MD on call as well.

## 2022-09-01 NOTE — Progress Notes (Addendum)
Physical Therapy Treatment Patient Details Name: [name] MRN: [id number] DOB: [birth year] Today's Date: 09/01/2022   History of Present Illness Patient is a 67 yr old woman who presented with with fatigue and shortness of breath.  Currently being treated for septic shock due to cellulitis & UTI. PMH: NASH cirrhosis, HTN, PE, a fib, HTN, HLD    PT Comments  Pt is progressing toward acute PT goals this session with performance of multiple sit to stand transfers and short distance ambulation this date. Pt more motivated to participate today and encouraged to ambulate with mobility specialist later this afternoon- discussed with mobility specialists and updated on pt mobility/level of assist during PT session and advised for +2 for chair follow to maximize safety and allow seated rest breaks. PT goals updated this date to reflect progress with mobility. Pt will benefit from continued skilled PT to increase their independence and maximize safety with mobility.      If plan is discharge home, recommend the following: Assistance with cooking/housework;Help with stairs or ramp for entrance;A little help with walking and/or transfers;A little help with bathing/dressing/bathroom   Can travel by private vehicle     Yes  Equipment Recommendations  None recommended by PT    Recommendations for Other Services       Precautions / Restrictions Precautions Precautions: Fall Precaution Comments: T3 T5 comp Fx Restrictions Weight Bearing Restrictions: No Other Position/Activity Restrictions: contact precautions     Mobility  Bed Mobility Overal bed mobility: Needs Assistance Bed Mobility: Supine to Sit Rolling: Mod assist         General bed mobility comments: assist for bringing trunk to upright. pt will benefit from mattress being delfated du to short stature    Transfers Overall transfer level: Needs assistance Equipment used: Rolling walker (2 wheels) Transfers: Sit to/from  Stand Sit to Stand: Min assist   Step pivot transfers: Contact guard assist       General transfer comment: MIN A from EOB. CGA from recliner x3 for assist with pericare.    Ambulation/Gait Ambulation/Gait assistance: Contact guard assist Gait Distance (Feet): 15 Feet Assistive device: Rolling walker (2 wheels) Gait Pattern/deviations: Decreased stride length, Step-to pattern Gait velocity: decr     General Gait Details: reports most challenging thing about ambulation is "picking feet up". Able to ambulate in room with no overt LOB observed. Initially with step through pattern with limited stride length, regressed to step to.   Stairs             Wheelchair Mobility     Tilt Bed    Modified Rankin (Stroke Patients Only)       Balance Overall balance assessment: Needs assistance Sitting-balance support: Feet supported Sitting balance-Leahy Scale: Fair     Standing balance support: Bilateral upper extremity supported, During functional activity Standing balance-Leahy Scale: Poor                              Cognition Arousal: Alert                                     General Comments: Pt tearful intermittently throughout session and apoligizing about "last night". A&O x3 unclear of situation. Reports that she "sees bugs" explainign that she sees "little roaches" all over but recoginzes that they are not actually there. Pt telling therapist that she goes  by "[name]" when asked- which is reportedly new to staff. Followign session, therapist noted that name '[name]" was also listed on one of EV[facility] staff member carts in hallway.        Exercises      General Comments        Pertinent Vitals/Pain Pain Assessment Pain Assessment: Faces Faces Pain Scale: Hurts little more Pain Location: buttock Pain Descriptors / Indicators: Sore Pain Intervention(s): Limited activity within patient's tolerance, Monitored during session,  Repositioned, Other (comment) (RN notified and in to perform skin check during session)    Home Living                          Prior Function            PT Goals (current goals can now be found in the care plan section) Acute Rehab PT Goals Patient Stated Goal: To Walk; To decrease pain PT Goal Formulation: With patient/family Time For Goal Achievement: 09/15/22 Potential to Achieve Goals: Good Progress towards PT goals: Progressing toward goals    Frequency    Min 1X/week      PT Plan      Co-evaluation              AM-PAC PT "6 Clicks" Mobility   Outcome Measure  Help needed turning from your back to your side while in a flat bed without using bedrails?: A Little Help needed moving from lying on your back to sitting on the side of a flat bed without using bedrails?: A Lot Help needed moving to and from a bed to a chair (including a wheelchair)?: A Little Help needed standing up from a chair using your arms (e.g., wheelchair or bedside chair)?: A Little Help needed to walk in hospital room?: A Little Help needed climbing 3-5 steps with a railing? : A Lot 6 Click Score: 16    End of Session Equipment Utilized During Treatment: Gait belt Activity Tolerance: Patient tolerated treatment well Patient left: in chair;with call bell/phone within reach;with chair alarm set Nurse Communication: Mobility status (RN present intermittently throughout session) PT Visit Diagnosis: Other abnormalities of gait and mobility (R26.89);Muscle weakness (generalized) (M62.81)     Time: 9562-1308 PT Time Calculation (min) (ACUTE ONLY): 36 min  Charges:    $Therapeutic Activity: 23-37 mins PT General Charges $$ ACUTE PT VISIT: 1 Visit                     Lyman Speller PT, DPT  Acute Rehabilitation Services  Office [phone]

## 2022-09-02 ENCOUNTER — Inpatient Hospital Stay (HOSPITAL_COMMUNITY): Payer: Medicare (Managed Care)

## 2022-09-02 DIAGNOSIS — R6521 Severe sepsis with septic shock: Secondary | ICD-10-CM | POA: Diagnosis not present

## 2022-09-02 DIAGNOSIS — A419 Sepsis, unspecified organism: Secondary | ICD-10-CM | POA: Diagnosis not present

## 2022-09-02 LAB — CBC WITH DIFFERENTIAL/PLATELET
Abs Immature Granulocytes: 0.01 10*3/uL (ref 0.00–0.07)
Basophils Absolute: 0 10*3/uL (ref 0.0–0.1)
Basophils Relative: 1 %
Eosinophils Absolute: 0.4 10*3/uL (ref 0.0–0.5)
Eosinophils Relative: 7 %
HCT: 28.4 % — ABNORMAL LOW (ref 36.0–46.0)
Hemoglobin: 8.6 g/dL — ABNORMAL LOW (ref 12.0–15.0)
Immature Granulocytes: 0 %
Lymphocytes Relative: 31 %
Lymphs Abs: 1.9 10*3/uL (ref 0.7–4.0)
MCH: 29.5 pg (ref 26.0–34.0)
MCHC: 30.3 g/dL (ref 30.0–36.0)
MCV: 97.3 fL (ref 80.0–100.0)
Monocytes Absolute: 0.8 10*3/uL (ref 0.1–1.0)
Monocytes Relative: 14 %
Neutro Abs: 2.8 10*3/uL (ref 1.7–7.7)
Neutrophils Relative %: 47 %
Platelets: 93 10*3/uL — ABNORMAL LOW (ref 150–400)
RBC: 2.92 MIL/uL — ABNORMAL LOW (ref 3.87–5.11)
RDW: 18 % — ABNORMAL HIGH (ref 11.5–15.5)
WBC: 5.9 10*3/uL (ref 4.0–10.5)
nRBC: 0 % (ref 0.0–0.2)

## 2022-09-02 LAB — COMPREHENSIVE METABOLIC PANEL
ALT: 18 U/L (ref 0–44)
AST: 21 U/L (ref 15–41)
Albumin: 2.5 g/dL — ABNORMAL LOW (ref 3.5–5.0)
Alkaline Phosphatase: 160 U/L — ABNORMAL HIGH (ref 38–126)
Anion gap: 7 (ref 5–15)
BUN: 25 mg/dL — ABNORMAL HIGH (ref 8–23)
CO2: 25 mmol/L (ref 22–32)
Calcium: 8 mg/dL — ABNORMAL LOW (ref 8.9–10.3)
Chloride: 108 mmol/L (ref 98–111)
Creatinine, Ser: 1.14 mg/dL — ABNORMAL HIGH (ref 0.44–1.00)
GFR, Estimated: 54 mL/min — ABNORMAL LOW (ref >60)
Glucose, Bld: 102 mg/dL — ABNORMAL HIGH (ref 70–99)
Potassium: 3.5 mmol/L (ref 3.5–5.1)
Sodium: 140 mmol/L (ref 135–145)
Total Bilirubin: 2.1 mg/dL — ABNORMAL HIGH (ref 0.3–1.2)
Total Protein: 5.4 g/dL — ABNORMAL LOW (ref 6.5–8.1)

## 2022-09-02 LAB — PROTIME-INR
INR: 1.8 — ABNORMAL HIGH (ref 0.8–1.2)
Prothrombin Time: 21 s — ABNORMAL HIGH (ref 11.4–15.2)

## 2022-09-02 LAB — MAGNESIUM: Magnesium: 2 mg/dL (ref 1.7–2.4)

## 2022-09-02 LAB — PHOSPHORUS: Phosphorus: 3.4 mg/dL (ref 2.5–4.6)

## 2022-09-02 NOTE — Plan of Care (Signed)

## 2022-09-02 NOTE — Progress Notes (Signed)
PROGRESS NOTE    [name]  QMV:[id number] DOB: 10-Jun-1955 DOA: 08/11/2022 PCP: [name], MD  Chief Complaint  Patient presents with   Hypotension   Shortness of Breath    Brief Narrative:   Patient is [name] 67 y/o woman with [name] history of cirhrosis who presented with fatigue and shortness of breath. Currently being treated for septic shock due to cellulitis with Flavobacterium.  Initially with the ICU.  Treated with pressors.  Transition to midodrine. ID was consulted on 8/4 for flava bacterium bacteremia. Palliative care was consulted on 8/8 for goals of care currently DNR. Transferred to hospitalist service on 8/9. Transferred out of the stepdown on 8/10. IV antibiotic until 8/17. Started to have vaginal bleeding on 8/12, resolved.  Assessment & Plan:   Principal Problem:   Septic shock (HCC) Active Problems:   AKI (acute kidney injury) (HCC)   Hypotension   Physical deconditioning   Goals of care, counseling/discussion   Decompensated hepatic cirrhosis (HCC)   Cellulitis of left lower extremity   Sepsis with acute renal failure, tubular necrosis, and septic shock (HCC)  Septic shock (HCC) with AKI, multifactorial Flavobacterium bacteremia, LLE cellulitis and polymicrobial UTI Concern for cat scratch fever -Met criteria on admission with lactic acidosis, encephalopathy, AKI, hypotension, tachycardia and tachypnea -Patient was admitted to ICU, was placed on pressors, fluids and IV albumin -Patient was treated with cefepime and Zithromax (for 5 days to cover cat scratch/Bartonella) -Blood cultures positive for flavobacterium. - ID was consulted, placed on meropenem for 2 weeks, completed on 08/26/2022.  Discussed with ID by my partner, does not need any further antibiotics. -CT abdomen 8/15 showed pancolitis -C. difficile PCR negative, GI pathogen panel negative   Acute on chronic HFpEF, hypertension Moderate aortic stenosis -2D echo 06/2022> EF 60-65%, G1DD,  mod AS -CT chest showed bilateral pleural effusions, patchy airspace disease in RUL and superior segment of LLL concerning for multifocal pneumonia (suspect this maybe due to volume overload with lack of respiratory symptoms or infectious symptoms at this time), difufse chest wall, body wall edema -BNP 614.9, started on Lasix 40 mg IV BID, spironolactone 25 mg daily -Strict I's and O's and daily weights  Decompensated NASH cirrhosis with ascites Coagulopathy, anemia, thrombocytopenia, anasarca H/o GIB  -Continue rifaximin,outpatient GI follow-up -Lactulose placed on prn due to diarrhea (no asterixis on exam today) -Patient appears to have anasarca with hypoalbuminemia, diuretics were held during admission due to AKI, hypotension -BNP 614.9, CT chest with bilateral pleural effusions, ascites, diffuse chest wall/body wall edema -Placed on Lasix 40 mg IV BID, continue spironolactone - titrate as tolerated  -she has abdominal discomfort, r/o SBP -> paracentesis ordered  Acute on chronic back pain -Due to bacteremia, acute on chronic back pain, MRI T-spine, L-spine was obtained - motion artifact -> no evidence of discitis or osteomyelitis -> acute compression fractures of T3 and T5 vertebral bodies with marrow edema, moderate height loss at T3, mild to moderate height loss at T5 -planned for kyphoplasty today, this was canceled (due to weight?, note pending from IR) -will add on calcitonin. APAP prn, lidocaine patch.  Oxycodone prn.     AKI with uremia, metabolic acidosis, - no obstruction or hydronephrosis on CT imaging, presumed due to septic shock  - Treated with IV pressors, IV fluids.  - Renal function has improved - follow on lasix    Coagulopathy. Postmenopausal vaginal bleeding. Macrocytic anemia with acute on chronic blood loss anemia - Due to cirrhosis her INR has  remained elevated. - Patient was started on heparin in the ICU for DVT prophylaxis, subsequently started having  postmenopausal vaginal bleeding. -Received Vitamin K x 1 on 8/12 x 5 days.  Heparin held, CT abdomen did not show any RPH -Pelvic ultrasound showed myomatous uterus with several fibroids - Dr Allena Katz discussed with hematology and GYN on-call, received tranexamic acid twice daily, I d/w Dr Debroah Loop (GYN on-call), on 8/15 recommended Megace 40 mg p.o. twice daily for 2 weeks, will arrange outpatient follow-up at OB/GYN center for further evaluation outpatient. -Vaginal bleeding has resolved.   Physical deconditioning -PT evaluation   Hypothyroidism -Continue synthroid   Depression -Continue effexor    Headache. No focal deficits. Continue Tylenol.   Chronic right eye pain on movement towards medial side. Resolved. No red flag sign. Needs outpatient follow up   Chronic left ear pain. Outpatient follow-up with ENT recommended.   Right thyroid nodule 1.5 cm -Incidental seen on CT chest, recommend nonemergent thyroid ultrasound   Obesity Body mass index is 66.9 kg/m.      DVT prophylaxis: SCD Code Status: DNR Family Communication: son 8/21 Disposition:   Status is: Inpatient Remains inpatient appropriate because: continued need for inpatient care   Consultants:  CCM ID gyn  Procedures:  none  Antimicrobials:  Anti-infectives (From admission, onward)    Start     Dose/Rate Route Frequency Ordered Stop   08/31/22 1030  vancomycin (VANCOREADY) IVPB 1500 mg/300 mL        1,500 mg 150 mL/hr over 120 Minutes Intravenous To Radiology 08/30/22 2001 09/01/22 1030   08/20/22 1200  meropenem (MERREM) 1 g in sodium chloride 0.9 % 100 mL IVPB  Status:  Discontinued        1 g 200 mL/hr over 30 Minutes Intravenous Every 8 hours 08/20/22 1101 08/26/22 1453   08/14/22 1800  azithromycin (ZITHROMAX) 250 mg in dextrose 5 % 125 mL IVPB  Status:  Discontinued        250 mg 127.5 mL/hr over 60 Minutes Intravenous Daily 08/14/22 1738 08/14/22 1752   08/14/22 1800  azithromycin  (ZITHROMAX) 500 mg in sodium chloride 0.9 % 250 mL IVPB  Status:  Discontinued        500 mg 250 mL/hr over 60 Minutes Intravenous Daily 08/14/22 1752 08/15/22 1054   08/14/22 1500  meropenem (MERREM) 1 g in sodium chloride 0.9 % 100 mL IVPB  Status:  Discontinued        1 g 200 mL/hr over 30 Minutes Intravenous Every 12 hours 08/14/22 1354 08/20/22 1101   08/13/22 2100  ceFEPIme (MAXIPIME) 2 g in sodium chloride 0.9 % 100 mL IVPB  Status:  Discontinued        2 g 200 mL/hr over 30 Minutes Intravenous Every 24 hours 08/13/22 0436 08/13/22 1944   08/13/22 2100  Cefiderocol Sulfate Tosylate (FETROJA) 1 g in sodium chloride 0.9 % 100 mL IVPB  Status:  Discontinued        1 g 37.1 mL/hr over 3 Hours Intravenous Every 8 hours 08/13/22 1944 08/14/22 1353   08/13/22 0800  vancomycin (VANCOREADY) IVPB 1250 mg/250 mL  Status:  Discontinued        1,250 mg 166.7 mL/hr over 90 Minutes Intravenous Every 48 hours 08/11/22 1415 08/12/22 0717   08/12/22 2200  ceFEPIme (MAXIPIME) 2 g in sodium chloride 0.9 % 100 mL IVPB  Status:  Discontinued        2 g 200 mL/hr over 30 Minutes Intravenous Every  12 hours 08/12/22 1743 08/13/22 0436   08/12/22 1000  azithromycin (ZITHROMAX) tablet 250 mg  Status:  Discontinued       Placed in "Followed by" Linked Group   250 mg Oral Daily 08/11/22 1353 08/14/22 1738   08/11/22 2000  ceFEPIme (MAXIPIME) 2 g in sodium chloride 0.9 % 100 mL IVPB  Status:  Discontinued        2 g 200 mL/hr over 30 Minutes Intravenous Every 12 hours 08/11/22 1415 08/12/22 1743   08/11/22 1500  rifaximin (XIFAXAN) tablet 550 mg        550 mg Oral 2 times daily 08/11/22 1413     08/11/22 1445  azithromycin (ZITHROMAX) tablet 500 mg       Placed in "Followed by" Linked Group   500 mg Oral  Once 08/11/22 1353 08/11/22 1501   08/11/22 0745  vancomycin (VANCOCIN) IVPB 1000 mg/200 mL premix  Status:  Discontinued        1,000 mg 200 mL/hr over 60 Minutes Intravenous  Once 08/11/22 0739 08/11/22  0740   08/11/22 0745  clindamycin (CLEOCIN) IVPB 600 mg        600 mg 100 mL/hr over 30 Minutes Intravenous  Once 08/11/22 0739 08/11/22 1004   08/11/22 0745  vancomycin (VANCOREADY) IVPB 2000 mg/400 mL        2,000 mg 200 mL/hr over 120 Minutes Intravenous STAT 08/11/22 0740 08/11/22 1030   08/11/22 0745  meropenem (MERREM) 1 g in sodium chloride 0.9 % 100 mL IVPB        1 g 200 mL/hr over 30 Minutes Intravenous STAT 08/11/22 0740 08/11/22 0914       Subjective: No new complaints today  Objective: Vitals:   08/31/22 2019 09/01/22 0635 09/01/22 1919 09/02/22 0542  BP: 118/61  138/81 126/68  Pulse: 83  88 81  Resp: 20  16 20   Temp: 97.6 F (36.4 C) 98.6 F (37 C) 97.9 F (36.6 C) 98 F (36.7 C)  TempSrc: Oral Oral Oral Oral  SpO2: 97%  99% 94%  Weight:  (!) 165.9 kg    Height:        Intake/Output Summary (Last 24 hours) at 09/02/2022 1305 Last data filed at 09/02/2022 1100 Gross per 24 hour  Intake 228.05 ml  Output 2700 ml  Net -2471.95 ml   Filed Weights   08/19/22 0500 08/31/22 0707 09/01/22 0635  Weight: 130.1 kg 130.1 kg (!) 165.9 kg    Examination:  General: No acute distress. Cardiovascular: RRR Lungs: unlabored Neurological: Alert and oriented 3. Moves all extremities 4 with equal strength. Cranial nerves II through XII grossly intact. Extremities: dependent edema to hips  Data Reviewed: I have personally reviewed following labs and imaging studies  CBC: Recent Labs  Lab 08/29/22 0249 08/30/22 0354 08/31/22 0128 09/01/22 0523 09/02/22 0501  WBC 5.5 5.1 3.8* 6.6 5.9  NEUTROABS  --   --   --  4.6 2.8  HGB 8.0* 8.2* 8.9* 8.7* 8.6*  HCT 26.4* 26.4* 28.7* 28.2* 28.4*  MCV 97.4 97.8 95.3 97.2 97.3  PLT 98* 87* 84* 111* 93*    Basic Metabolic Panel: Recent Labs  Lab 08/29/22 0249 08/30/22 0354 08/31/22 0128 09/01/22 0523 09/02/22 0501  NA 140 140 141 139 140  K 3.9 3.7 4.1 3.6 3.5  CL 111 111 109 110 108  CO2 24 24 24 23 25   GLUCOSE  99 102* 110* 160* 102*  BUN 29* 24* 20 24* 25*  CREATININE  0.99 0.94 0.95 1.09* 1.14*  CALCIUM 8.0* 7.8* 8.2* 7.9* 8.0*  MG  --   --   --  1.5* 2.0  PHOS  --   --   --  3.5 3.4    GFR: Estimated Creatinine Clearance: 75.9 mL/min (Tamikka Pilger) (by C-G formula based on SCr of 1.14 mg/dL (H)).  Liver Function Tests: Recent Labs  Lab 08/27/22 0355 08/28/22 0408 08/29/22 0249 09/01/22 0523 09/02/22 0501  AST 20 25 26 23 21   ALT 18 18 18 19 18   ALKPHOS 133* 144* 145* 172* 160*  BILITOT 2.1* 2.2* 2.2* 2.0* 2.1*  PROT 5.2* 5.4* 5.2* 5.3* 5.4*  ALBUMIN 2.5* 2.5* 2.4* 2.4* 2.5*    CBG: No results for input(s): "GLUCAP" in the last 168 hours.   Recent Results (from the past 240 hour(s))  C Difficile Quick Screen (NO PCR Reflex)     Status: None   Collection Time: 08/26/22  8:12 PM   Specimen: STOOL  Result Value Ref Range Status   C Diff antigen NEGATIVE NEGATIVE Final   C Diff toxin NEGATIVE NEGATIVE Final   C Diff interpretation No C. difficile detected.  Final    Comment: Performed at [facility], [address], [place], [place] [address]  Gastrointestinal Panel by PCR , Stool     Status: None   Collection Time: 08/26/22  8:12 PM   Specimen: Stool  Result Value Ref Range Status   Campylobacter species NOT DETECTED NOT DETECTED Final   Plesimonas shigelloides NOT DETECTED NOT DETECTED Final   Salmonella species NOT DETECTED NOT DETECTED Final   Yersinia enterocolitica NOT DETECTED NOT DETECTED Final   Vibrio species NOT DETECTED NOT DETECTED Final   Vibrio cholerae NOT DETECTED NOT DETECTED Final   Enteroaggregative E coli (EAEC) NOT DETECTED NOT DETECTED Final   Enteropathogenic E coli (EPEC) NOT DETECTED NOT DETECTED Final   Enterotoxigenic E coli (ETEC) NOT DETECTED NOT DETECTED Final   Shiga like toxin producing E coli (STEC) NOT DETECTED NOT DETECTED Final   Shigella/Enteroinvasive E coli (EIEC) NOT DETECTED NOT DETECTED Final   Cryptosporidium NOT  DETECTED NOT DETECTED Final   Cyclospora cayetanensis NOT DETECTED NOT DETECTED Final   Entamoeba histolytica NOT DETECTED NOT DETECTED Final   Giardia lamblia NOT DETECTED NOT DETECTED Final   Adenovirus F40/41 NOT DETECTED NOT DETECTED Final   Astrovirus NOT DETECTED NOT DETECTED Final   Norovirus GI/GII NOT DETECTED NOT DETECTED Final   Rotavirus Nai Dasch NOT DETECTED NOT DETECTED Final   Sapovirus (I, II, IV, and V) NOT DETECTED NOT DETECTED Final    Comment: Performed at [facility], [address], [place], [place] [address]         Radiology Studies: No results found.      Scheduled Meds:  calcitonin (salmon)  1 spray Alternating Nares Daily   dicyclomine  10 mg Oral TID AC   diphenhydrAMINE  50 mg Oral Once   famotidine  20 mg Oral QAC breakfast   feeding supplement (KATE FARMS STANDARD 1.4)  325 mL Oral Daily   furosemide  40 mg Intravenous BID   ketotifen  1 drop Right Eye BID   levothyroxine  150 mcg Oral QAC breakfast   lidocaine  2 patch Transdermal Q24H   magic mouthwash  5 mL Oral QID   megestrol  40 mg Oral BID   midodrine  10 mg Oral TID WC   nystatin cream   Topical BID   pantoprazole  40 mg Oral BID AC   rifaximin  550 mg Oral BID   rOPINIRole  0.25 mg Oral QHS   sodium chloride flush  10-40 mL Intracatheter Q12H   spironolactone  25 mg Oral Daily   tamsulosin  0.4 mg Oral Daily   venlafaxine XR  75 mg Oral Q breakfast   zinc sulfate  220 mg Oral Daily   Continuous Infusions:  sodium chloride 250 mL (08/21/22 1447)     LOS: 22 days    Time spent: over 30 min    Lacretia Nicks, MD [facility]   To contact the attending provider between 7A-7P or the covering provider during after hours 7P-7A, please log into the web site [email or web address] and access using universal [place] password for that web site. If you do not have the password, please call the hospital operator.  09/02/2022, 1:05 PM

## 2022-09-02 NOTE — Progress Notes (Signed)
Physical Therapy Treatment Patient Details Name: [name] MRN: [id number] DOB: March 13, 1955 Today's Date: 09/02/2022   History of Present Illness Patient is a 67 yr old woman who presented with with fatigue and shortness of breath.  Currently being treated for septic shock due to cellulitis & UTI. PMH: NASH cirrhosis, HTN, PE, a fib, HTN, HLD    PT Comments  Pt agreeable to mobilize and assisted to sitting EOB however reported not feeling well and abdominal pain once sitting.  Pt reports having "fluid taken off" today and paracentesis to be ordered per MD notes today.  Pt assisted back to supine and repositioned to comfort (also changed gown - wet with urine?).  RN notified. Pt was also observed sitting in recliner earlier today.    If plan is discharge home, recommend the following: Assistance with cooking/housework;Help with stairs or ramp for entrance;A little help with walking and/or transfers;A little help with bathing/dressing/bathroom   Can travel by private vehicle        Equipment Recommendations  None recommended by PT    Recommendations for Other Services       Precautions / Restrictions Precautions Precautions: Fall Precaution Comments: T3 T5 comp Fx     Mobility  Bed Mobility Overal bed mobility: Needs Assistance Bed Mobility: Rolling, Sidelying to Sit, Sit to Supine Rolling: Min assist Sidelying to sit: Mod assist Supine to sit: Mod assist     General bed mobility comments: assist for bringing trunk to upright. assist for LEs onto bed.  mattress deflated for safety to edge of bed (pt with short stature)    Transfers                        Ambulation/Gait                   Stairs             Wheelchair Mobility     Tilt Bed    Modified Rankin (Stroke Patients Only)       Balance Overall balance assessment: Needs assistance   Sitting balance-Leahy Scale: Fair                                       Cognition Arousal: Alert Behavior During Therapy: WFL for tasks assessed/performed Overall Cognitive Status: Within Functional Limits for tasks assessed                                          Exercises      General Comments        Pertinent Vitals/Pain Pain Assessment Pain Assessment: Faces Faces Pain Scale: Hurts little more Pain Location: back Pain Descriptors / Indicators: Sore Pain Intervention(s): Repositioned, Monitored during session, Patient requesting pain meds-RN notified    Home Living                          Prior Function            PT Goals (current goals can now be found in the care plan section) Progress towards PT goals: Progressing toward goals    Frequency           PT Plan      Co-evaluation  AM-PAC PT "6 Clicks" Mobility   Outcome Measure  Help needed turning from your back to your side while in a flat bed without using bedrails?: A Little Help needed moving from lying on your back to sitting on the side of a flat bed without using bedrails?: A Lot Help needed moving to and from a bed to a chair (including a wheelchair)?: A Little Help needed standing up from a chair using your arms (e.g., wheelchair or bedside chair)?: A Little Help needed to walk in hospital room?: A Little Help needed climbing 3-5 steps with a railing? : A Lot 6 Click Score: 16    End of Session   Activity Tolerance: Patient limited by pain Patient left: in bed;with call bell/phone within reach Nurse Communication: Mobility status;Patient requests pain meds PT Visit Diagnosis: Muscle weakness (generalized) (M62.81)     Time: 0981-1914 PT Time Calculation (min) (ACUTE ONLY): 12 min  Charges:    $Therapeutic Activity: 8-22 mins PT General Charges $$ ACUTE PT VISIT: 1 Visit                     Thomasene Mohair PT, DPT Physical Therapist Acute Rehabilitation Services Office: [phone]    Janan Halter  [place] 09/02/2022, 3:13 PM

## 2022-09-03 DIAGNOSIS — A419 Sepsis, unspecified organism: Secondary | ICD-10-CM | POA: Diagnosis not present

## 2022-09-03 DIAGNOSIS — R6521 Severe sepsis with septic shock: Secondary | ICD-10-CM | POA: Diagnosis not present

## 2022-09-03 LAB — CBC WITH DIFFERENTIAL/PLATELET
Abs Immature Granulocytes: 0.01 10*3/uL (ref 0.00–0.07)
Basophils Absolute: 0 10*3/uL (ref 0.0–0.1)
Basophils Relative: 1 %
Eosinophils Absolute: 0.4 10*3/uL (ref 0.0–0.5)
Eosinophils Relative: 9 %
HCT: 31.3 % — ABNORMAL LOW (ref 36.0–46.0)
Hemoglobin: 9.1 g/dL — ABNORMAL LOW (ref 12.0–15.0)
Immature Granulocytes: 0 %
Lymphocytes Relative: 34 %
Lymphs Abs: 1.7 10*3/uL (ref 0.7–4.0)
MCH: 29.7 pg (ref 26.0–34.0)
MCHC: 29.1 g/dL — ABNORMAL LOW (ref 30.0–36.0)
MCV: 102.3 fL — ABNORMAL HIGH (ref 80.0–100.0)
Monocytes Absolute: 0.6 10*3/uL (ref 0.1–1.0)
Monocytes Relative: 13 %
Neutro Abs: 2.1 10*3/uL (ref 1.7–7.7)
Neutrophils Relative %: 43 %
Platelets: 96 10*3/uL — ABNORMAL LOW (ref 150–400)
RBC: 3.06 MIL/uL — ABNORMAL LOW (ref 3.87–5.11)
RDW: 17.9 % — ABNORMAL HIGH (ref 11.5–15.5)
WBC: 4.9 10*3/uL (ref 4.0–10.5)
nRBC: 0 % (ref 0.0–0.2)

## 2022-09-03 LAB — URINALYSIS, W/ REFLEX TO CULTURE (INFECTION SUSPECTED)
Bilirubin Urine: NEGATIVE
Glucose, UA: NEGATIVE mg/dL
Ketones, ur: NEGATIVE mg/dL
Nitrite: POSITIVE — AB
Protein, ur: NEGATIVE mg/dL
Specific Gravity, Urine: 1.006 (ref 1.005–1.030)
pH: 7 (ref 5.0–8.0)

## 2022-09-03 LAB — PROTIME-INR
INR: 1.9 — ABNORMAL HIGH (ref 0.8–1.2)
Prothrombin Time: 21.6 seconds — ABNORMAL HIGH (ref 11.4–15.2)

## 2022-09-03 LAB — COMPREHENSIVE METABOLIC PANEL
ALT: 18 U/L (ref 0–44)
AST: 22 U/L (ref 15–41)
Albumin: 2.4 g/dL — ABNORMAL LOW (ref 3.5–5.0)
Alkaline Phosphatase: 155 U/L — ABNORMAL HIGH (ref 38–126)
Anion gap: 6 (ref 5–15)
BUN: 18 mg/dL (ref 8–23)
CO2: 24 mmol/L (ref 22–32)
Calcium: 7.8 mg/dL — ABNORMAL LOW (ref 8.9–10.3)
Chloride: 109 mmol/L (ref 98–111)
Creatinine, Ser: 1.01 mg/dL — ABNORMAL HIGH (ref 0.44–1.00)
GFR, Estimated: >60 mL/min (ref >60)
Glucose, Bld: 99 mg/dL (ref 70–99)
Potassium: 3.3 mmol/L — ABNORMAL LOW (ref 3.5–5.1)
Sodium: 139 mmol/L (ref 135–145)
Total Bilirubin: 2.9 mg/dL — ABNORMAL HIGH (ref 0.3–1.2)
Total Protein: 5.3 g/dL — ABNORMAL LOW (ref 6.5–8.1)

## 2022-09-03 LAB — AMMONIA: Ammonia: 155 umol/L — ABNORMAL HIGH (ref 9–35)

## 2022-09-03 LAB — MAGNESIUM: Magnesium: 1.7 mg/dL (ref 1.7–2.4)

## 2022-09-03 LAB — PHOSPHORUS: Phosphorus: 3.1 mg/dL (ref 2.5–4.6)

## 2022-09-03 MED ORDER — METHOCARBAMOL 500 MG PO TABS
500.0000 mg | ORAL_TABLET | Freq: Once | ORAL | Status: AC
  Administered 2022-09-03: 500 mg via ORAL
  Filled 2022-09-03: qty 1

## 2022-09-03 MED ORDER — HYDROXYZINE HCL 25 MG PO TABS
12.5000 mg | ORAL_TABLET | Freq: Three times a day (TID) | ORAL | Status: DC | PRN
  Administered 2022-09-07: 12.5 mg via ORAL
  Filled 2022-09-03 (×2): qty 1

## 2022-09-03 MED ORDER — LACTULOSE 10 GM/15ML PO SOLN
20.0000 g | Freq: Two times a day (BID) | ORAL | Status: DC
  Administered 2022-09-03: 20 g via ORAL
  Filled 2022-09-03: qty 30

## 2022-09-03 MED ORDER — LACTULOSE 10 GM/15ML PO SOLN
20.0000 g | Freq: Three times a day (TID) | ORAL | Status: DC
  Administered 2022-09-03 – 2022-09-09 (×14): 20 g via ORAL
  Filled 2022-09-03 (×14): qty 30

## 2022-09-03 NOTE — Plan of Care (Signed)
  Problem: Education: Goal: Knowledge of General Education information will improve Description: Including pain rating scale, medication(s)/side effects and non-pharmacologic comfort measures Outcome: Progressing   Problem: Clinical Measurements: Goal: Ability to maintain clinical measurements within normal limits will improve Outcome: Progressing Goal: Will remain free from infection Outcome: Progressing   Problem: Pain Managment: Goal: General experience of comfort will improve Outcome: Progressing   Problem: Safety: Goal: Ability to remain free from injury will improve Outcome: Progressing   Problem: Skin Integrity: Goal: Risk for impaired skin integrity will decrease Outcome: Progressing

## 2022-09-03 NOTE — Plan of Care (Signed)
  Problem: Education: Goal: Knowledge of General Education information will improve Description: Including pain rating scale, medication(s)/side effects and non-pharmacologic comfort measures Outcome: Progressing   Problem: Health Behavior/Discharge Planning: Goal: Ability to manage health-related needs will improve Outcome: Progressing   Problem: Clinical Measurements: Goal: Ability to maintain clinical measurements within normal limits will improve Outcome: Progressing Goal: Respiratory complications will improve Outcome: Progressing   Problem: Nutrition: Goal: Adequate nutrition will be maintained Outcome: Progressing   Problem: Coping: Goal: Level of anxiety will decrease Outcome: Progressing   Problem: Pain Managment: Goal: General experience of comfort will improve Outcome: Progressing

## 2022-09-03 NOTE — Progress Notes (Signed)
PROGRESS NOTE    [name]  WGN:[id number] DOB: [birth year] DOA: 08/11/2022 PCP: [name], MD  Chief Complaint  Patient presents with   Hypotension   Shortness of Breath    Brief Narrative:   Patient is [name] 67 y/o woman with Jamall Strohmeier history of cirhrosis who presented with fatigue and shortness of breath. Currently being treated for septic shock due to cellulitis with Flavobacterium.  Initially with the ICU.  Treated with pressors.  Transition to midodrine. ID was consulted on 8/4 for flava bacterium bacteremia. Palliative care was consulted on 8/8 for goals of care currently DNR. Transferred to hospitalist service on 8/9. Transferred out of the stepdown on 8/10. IV antibiotic until 8/17. Started to have vaginal bleeding on 8/12, resolved.  Assessment & Plan:   Principal Problem:   Septic shock (HCC) Active Problems:   AKI (acute kidney injury) (HCC)   Hypotension   Physical deconditioning   Goals of care, counseling/discussion   Decompensated hepatic cirrhosis (HCC)   Cellulitis of left lower extremity   Sepsis with acute renal failure, tubular necrosis, and septic shock (HCC)  Septic shock (HCC) with AKI, multifactorial Flavobacterium bacteremia, LLE cellulitis and polymicrobial UTI Concern for cat scratch fever -Met criteria on admission with lactic acidosis, encephalopathy, AKI, hypotension, tachycardia and tachypnea -Patient was admitted to ICU, was placed on pressors, fluids and IV albumin -Patient was treated with cefepime and Zithromax (for 5 days to cover cat scratch/Bartonella) -Blood cultures positive for flavobacterium. - ID was consulted, placed on meropenem for 2 weeks, completed on 08/26/2022.  Discussed with ID by my partner, does not need any further antibiotics. -CT abdomen 8/15 showed pancolitis -C. difficile PCR negative, GI pathogen panel negative   Acute on chronic HFpEF, hypertension Moderate aortic stenosis -2D echo 06/2022> EF 60-65%, G1DD,  mod AS -CT chest showed bilateral pleural effusions, patchy airspace disease in RUL and superior segment of LLL concerning for multifocal pneumonia (suspect this maybe due to volume overload with lack of respiratory symptoms or infectious symptoms at this time), difufse chest wall, body wall edema -BNP 614.9, started on Lasix 40 mg IV BID, spironolactone 25 mg daily -Strict I's and O's and daily weights  Decompensated NASH cirrhosis with ascites Coagulopathy, anemia, thrombocytopenia, anasarca Acute Hepatic Encephalopathy  H/o GIB  -Continue rifaximin,outpatient GI follow-up -Lactulose placed on prn due to diarrhea (no asterixis on exam today) -Patient appears to have anasarca with hypoalbuminemia, diuretics were held during admission due to AKI, hypotension -BNP 614.9, CT chest with bilateral pleural effusions, ascites, diffuse chest wall/body wall edema -Placed on Lasix 40 mg IV BID, continue spironolactone - titrate as tolerated  -she has abdominal discomfort, r/o SBP -> paracentesis ordered -> [place] with only mild ascites, no para done -asterixis, more lethargy today, follow with lactulose and rifaximin (will follow UA/culture as well with son's concern)  Acute on chronic back pain -Due to bacteremia, acute on chronic back pain, MRI T-spine, L-spine was obtained - motion artifact -> no evidence of discitis or osteomyelitis -> acute compression fractures of T3 and T5 vertebral bodies with marrow edema, moderate height loss at T3, mild to moderate height loss at T5 -planned for kyphoplasty today, this was canceled (due to weight?, note pending from IR) -will add on calcitonin. APAP prn, lidocaine patch.  Oxycodone prn.     AKI with uremia, metabolic acidosis, - no obstruction or hydronephrosis on CT imaging, presumed due to septic shock  - Treated with IV pressors, IV fluids.  - Renal  function has improved - follow on lasix    Coagulopathy. Postmenopausal vaginal bleeding. Macrocytic  anemia with acute on chronic blood loss anemia - Due to cirrhosis her INR has remained elevated. - Patient was started on heparin in the ICU for DVT prophylaxis, subsequently started having postmenopausal vaginal bleeding. -Received Vitamin K x 1 on 8/12 x 5 days.  Heparin held, CT abdomen did not show any RPH -Pelvic ultrasound showed myomatous uterus with several fibroids - Dr Allena Katz discussed with hematology and GYN on-call, received tranexamic acid twice daily, I d/w Dr Debroah Loop (GYN on-call), on 8/15 recommended Megace 40 mg p.o. twice daily for 2 weeks, will arrange outpatient follow-up at OB/GYN center for further evaluation outpatient. -Vaginal bleeding has resolved.   Physical deconditioning -PT evaluation   Hypothyroidism -Continue synthroid   Depression -Continue effexor    Headache. No focal deficits. Continue Tylenol.   Chronic right eye pain on movement towards medial side. Resolved. No red flag sign. Needs outpatient follow up   Chronic left ear pain. Outpatient follow-up with ENT recommended.   Right thyroid nodule 1.5 cm -Incidental seen on CT chest, recommend nonemergent thyroid ultrasound   Obesity Body mass index is 66.85 kg/m.      DVT prophylaxis: SCD Code Status: DNR Family Communication: son 8/21 Disposition:   Status is: Inpatient Remains inpatient appropriate because: continued need for inpatient care   Consultants:  CCM ID gyn  Procedures:  none  Antimicrobials:  Anti-infectives (From admission, onward)    Start     Dose/Rate Route Frequency Ordered Stop   08/31/22 1030  vancomycin (VANCOREADY) IVPB 1500 mg/300 mL        1,500 mg 150 mL/hr over 120 Minutes Intravenous To Radiology 08/30/22 2001 09/01/22 1030   08/20/22 1200  meropenem (MERREM) 1 g in sodium chloride 0.9 % 100 mL IVPB  Status:  Discontinued        1 g 200 mL/hr over 30 Minutes Intravenous Every 8 hours 08/20/22 1101 08/26/22 1453   08/14/22 1800  azithromycin  (ZITHROMAX) 250 mg in dextrose 5 % 125 mL IVPB  Status:  Discontinued        250 mg 127.5 mL/hr over 60 Minutes Intravenous Daily 08/14/22 1738 08/14/22 1752   08/14/22 1800  azithromycin (ZITHROMAX) 500 mg in sodium chloride 0.9 % 250 mL IVPB  Status:  Discontinued        500 mg 250 mL/hr over 60 Minutes Intravenous Daily 08/14/22 1752 08/15/22 1054   08/14/22 1500  meropenem (MERREM) 1 g in sodium chloride 0.9 % 100 mL IVPB  Status:  Discontinued        1 g 200 mL/hr over 30 Minutes Intravenous Every 12 hours 08/14/22 1354 08/20/22 1101   08/13/22 2100  ceFEPIme (MAXIPIME) 2 g in sodium chloride 0.9 % 100 mL IVPB  Status:  Discontinued        2 g 200 mL/hr over 30 Minutes Intravenous Every 24 hours 08/13/22 0436 08/13/22 1944   08/13/22 2100  Cefiderocol Sulfate Tosylate (FETROJA) 1 g in sodium chloride 0.9 % 100 mL IVPB  Status:  Discontinued        1 g 37.1 mL/hr over 3 Hours Intravenous Every 8 hours 08/13/22 1944 08/14/22 1353   08/13/22 0800  vancomycin (VANCOREADY) IVPB 1250 mg/250 mL  Status:  Discontinued        1,250 mg 166.7 mL/hr over 90 Minutes Intravenous Every 48 hours 08/11/22 1415 08/12/22 0717   08/12/22 2200  ceFEPIme (MAXIPIME)  2 g in sodium chloride 0.9 % 100 mL IVPB  Status:  Discontinued        2 g 200 mL/hr over 30 Minutes Intravenous Every 12 hours 08/12/22 1743 08/13/22 0436   08/12/22 1000  azithromycin (ZITHROMAX) tablet 250 mg  Status:  Discontinued       Placed in "Followed by" Linked Group   250 mg Oral Daily 08/11/22 1353 08/14/22 1738   08/11/22 2000  ceFEPIme (MAXIPIME) 2 g in sodium chloride 0.9 % 100 mL IVPB  Status:  Discontinued        2 g 200 mL/hr over 30 Minutes Intravenous Every 12 hours 08/11/22 1415 08/12/22 1743   08/11/22 1500  rifaximin (XIFAXAN) tablet 550 mg        550 mg Oral 2 times daily 08/11/22 1413     08/11/22 1445  azithromycin (ZITHROMAX) tablet 500 mg       Placed in "Followed by" Linked Group   500 mg Oral  Once 08/11/22 1353  08/11/22 1501   08/11/22 0745  vancomycin (VANCOCIN) IVPB 1000 mg/200 mL premix  Status:  Discontinued        1,000 mg 200 mL/hr over 60 Minutes Intravenous  Once 08/11/22 0739 08/11/22 0740   08/11/22 0745  clindamycin (CLEOCIN) IVPB 600 mg        600 mg 100 mL/hr over 30 Minutes Intravenous  Once 08/11/22 0739 08/11/22 1004   08/11/22 0745  vancomycin (VANCOREADY) IVPB 2000 mg/400 mL        2,000 mg 200 mL/hr over 120 Minutes Intravenous STAT 08/11/22 0740 08/11/22 1030   08/11/22 0745  meropenem (MERREM) 1 g in sodium chloride 0.9 % 100 mL IVPB        1 g 200 mL/hr over 30 Minutes Intravenous STAT 08/11/22 0740 08/11/22 0914       Subjective: confused  Objective: Vitals:   09/02/22 1300 09/02/22 2002 09/03/22 0500 09/03/22 1425  BP: (!) 146/69 128/66  (!) 146/85  Pulse: 68 82  (!) 103  Resp: (!) 21 20  16   Temp: 98.5 F (36.9 C) 98.3 F (36.8 C)  98.2 F (36.8 C)  TempSrc: Oral Oral  Oral  SpO2: 99% 95%  99%  Weight:   (!) 165.8 kg   Height:       No intake or output data in the 24 hours ending 09/03/22 1525  Filed Weights   08/31/22 0707 09/01/22 0635 09/03/22 0500  Weight: 130.1 kg (!) 165.9 kg (!) 165.8 kg    Examination:  General: No acute distress. Cardiovascular: RRR Lungs: unlabored Abdomen: Soft, nontender, nondistended Neurological: Alert, lethargic.  Asterixis on exam. Moves all extremities 4 Extremities: dependent edema to hips, less to lower legs  Data Reviewed: I have personally reviewed following labs and imaging studies  CBC: Recent Labs  Lab 08/29/22 0249 08/30/22 0354 08/31/22 0128 09/01/22 0523 09/02/22 0501  WBC 5.5 5.1 3.8* 6.6 5.9  NEUTROABS  --   --   --  4.6 2.8  HGB 8.0* 8.2* 8.9* 8.7* 8.6*  HCT 26.4* 26.4* 28.7* 28.2* 28.4*  MCV 97.4 97.8 95.3 97.2 97.3  PLT 98* 87* 84* 111* 93*    Basic Metabolic Panel: Recent Labs  Lab 08/29/22 0249 08/30/22 0354 08/31/22 0128 09/01/22 0523 09/02/22 0501  NA 140 140 141 139  140  K 3.9 3.7 4.1 3.6 3.5  CL 111 111 109 110 108  CO2 24 24 24 23 25   GLUCOSE 99 102* 110* 160* 102*  BUN 29* 24* 20 24* 25*  CREATININE 0.99 0.94 0.95 1.09* 1.14*  CALCIUM 8.0* 7.8* 8.2* 7.9* 8.0*  MG  --   --   --  1.5* 2.0  PHOS  --   --   --  3.5 3.4    GFR: Estimated Creatinine Clearance: 75.9 mL/min ([name]) (by C-G formula based on SCr of 1.14 mg/dL (H)).  Liver Function Tests: Recent Labs  Lab 08/28/22 0408 08/29/22 0249 09/01/22 0523 09/02/22 0501  AST 25 26 23 21   ALT 18 18 19 18   ALKPHOS 144* 145* 172* 160*  BILITOT 2.2* 2.2* 2.0* 2.1*  PROT 5.4* 5.2* 5.3* 5.4*  ALBUMIN 2.5* 2.4* 2.4* 2.5*    CBG: No results for input(s): "GLUCAP" in the last 168 hours.   Recent Results (from the past 240 hour(s))  C Difficile Quick Screen (NO PCR Reflex)     Status: None   Collection Time: 08/26/22  8:12 PM   Specimen: STOOL  Result Value Ref Range Status   C Diff antigen NEGATIVE NEGATIVE Final   C Diff toxin NEGATIVE NEGATIVE Final   C Diff interpretation No C. difficile detected.  Final    Comment: Performed at [facility], [address], [place], [place] [address]  Gastrointestinal Panel by PCR , Stool     Status: None   Collection Time: 08/26/22  8:12 PM   Specimen: Stool  Result Value Ref Range Status   Campylobacter species NOT DETECTED NOT DETECTED Final   Plesimonas shigelloides NOT DETECTED NOT DETECTED Final   Salmonella species NOT DETECTED NOT DETECTED Final   Yersinia enterocolitica NOT DETECTED NOT DETECTED Final   Vibrio species NOT DETECTED NOT DETECTED Final   Vibrio cholerae NOT DETECTED NOT DETECTED Final   Enteroaggregative E coli (EAEC) NOT DETECTED NOT DETECTED Final   Enteropathogenic E coli (EPEC) NOT DETECTED NOT DETECTED Final   Enterotoxigenic E coli (ETEC) NOT DETECTED NOT DETECTED Final   Shiga like toxin producing E coli (STEC) NOT DETECTED NOT DETECTED Final   Shigella/Enteroinvasive E coli (EIEC) NOT DETECTED NOT  DETECTED Final   Cryptosporidium NOT DETECTED NOT DETECTED Final   Cyclospora cayetanensis NOT DETECTED NOT DETECTED Final   Entamoeba histolytica NOT DETECTED NOT DETECTED Final   Giardia lamblia NOT DETECTED NOT DETECTED Final   Adenovirus F40/41 NOT DETECTED NOT DETECTED Final   Astrovirus NOT DETECTED NOT DETECTED Final   Norovirus GI/GII NOT DETECTED NOT DETECTED Final   Rotavirus [name] NOT DETECTED NOT DETECTED Final   Sapovirus (I, II, IV, and V) NOT DETECTED NOT DETECTED Final    Comment: Performed at [facility], [address] [place], [place] [address]         Radiology Studies: [place] ASCITES (ABDOMEN LIMITED)  Result Date: 09/02/2022 CLINICAL DATA:  Ascites EXAM: LIMITED ABDOMEN ULTRASOUND FOR ASCITES TECHNIQUE: Limited ultrasound survey for ascites was performed in all four abdominal quadrants. COMPARISON:  Ultrasound 08/12/2022 FINDINGS: Evaluation of 4 quadrants of the abdomen has Kara Tercero small pockets of fluid. IMPRESSION: Mild ascites.  Small pockets seen in the 4 quadrants Electronically Signed   By: Karen Kays M.D.   On: 09/02/2022 14:27        Scheduled Meds:  calcitonin (salmon)  1 spray Alternating Nares Daily   dicyclomine  10 mg Oral TID AC   diphenhydrAMINE  50 mg Oral Once   famotidine  20 mg Oral QAC breakfast   feeding supplement (KATE FARMS STANDARD 1.4)  325 mL Oral  Daily   furosemide  40 mg Intravenous BID   ketotifen  1 drop Right Eye BID   lactulose  20 g Oral TID   levothyroxine  150 mcg Oral QAC breakfast   lidocaine  2 patch Transdermal Q24H   magic mouthwash  5 mL Oral QID   megestrol  40 mg Oral BID   midodrine  10 mg Oral TID WC   nystatin cream   Topical BID   pantoprazole  40 mg Oral BID AC   rifaximin  550 mg Oral BID   rOPINIRole  0.25 mg Oral QHS   sodium chloride flush  10-40 mL Intracatheter Q12H   spironolactone  25 mg Oral Daily   tamsulosin  0.4 mg Oral Daily   venlafaxine XR  75 mg Oral Q breakfast   zinc sulfate  220  mg Oral Daily   Continuous Infusions:  sodium chloride 250 mL (08/21/22 1447)     LOS: 23 days    Time spent: over 30 min    Lacretia Nicks, MD [facility]   To contact the attending provider between 7A-7P or the covering provider during after hours 7P-7A, please log into the web site [email or web address] and access using universal [place] password for that web site. If you do not have the password, please call the hospital operator.  09/03/2022, 3:25 PM

## 2022-09-04 ENCOUNTER — Encounter (HOSPITAL_COMMUNITY): Payer: Self-pay | Admitting: Pulmonary Disease

## 2022-09-04 ENCOUNTER — Inpatient Hospital Stay (HOSPITAL_COMMUNITY): Payer: Medicare (Managed Care)

## 2022-09-04 DIAGNOSIS — E7220 Disorder of urea cycle metabolism, unspecified: Secondary | ICD-10-CM

## 2022-09-04 DIAGNOSIS — A419 Sepsis, unspecified organism: Secondary | ICD-10-CM | POA: Diagnosis not present

## 2022-09-04 DIAGNOSIS — K7290 Hepatic failure, unspecified without coma: Secondary | ICD-10-CM | POA: Diagnosis not present

## 2022-09-04 DIAGNOSIS — K7682 Hepatic encephalopathy: Secondary | ICD-10-CM

## 2022-09-04 DIAGNOSIS — Z7189 Other specified counseling: Secondary | ICD-10-CM | POA: Diagnosis not present

## 2022-09-04 DIAGNOSIS — R6521 Severe sepsis with septic shock: Secondary | ICD-10-CM | POA: Diagnosis not present

## 2022-09-04 LAB — COMPREHENSIVE METABOLIC PANEL
ALT: 16 U/L (ref 0–44)
AST: 21 U/L (ref 15–41)
Albumin: 2.4 g/dL — ABNORMAL LOW (ref 3.5–5.0)
Alkaline Phosphatase: 157 U/L — ABNORMAL HIGH (ref 38–126)
Anion gap: 11 (ref 5–15)
BUN: 15 mg/dL (ref 8–23)
CO2: 28 mmol/L (ref 22–32)
Calcium: 8.1 mg/dL — ABNORMAL LOW (ref 8.9–10.3)
Chloride: 103 mmol/L (ref 98–111)
Creatinine, Ser: 1.13 mg/dL — ABNORMAL HIGH (ref 0.44–1.00)
GFR, Estimated: 54 mL/min — ABNORMAL LOW (ref >60)
Glucose, Bld: 103 mg/dL — ABNORMAL HIGH (ref 70–99)
Potassium: 3.1 mmol/L — ABNORMAL LOW (ref 3.5–5.1)
Sodium: 142 mmol/L (ref 135–145)
Total Bilirubin: 2.3 mg/dL — ABNORMAL HIGH (ref 0.3–1.2)
Total Protein: 5.3 g/dL — ABNORMAL LOW (ref 6.5–8.1)

## 2022-09-04 LAB — CBC WITH DIFFERENTIAL/PLATELET
Abs Immature Granulocytes: 0.01 10*3/uL (ref 0.00–0.07)
Basophils Absolute: 0 10*3/uL (ref 0.0–0.1)
Basophils Relative: 1 %
Eosinophils Absolute: 0.4 10*3/uL (ref 0.0–0.5)
Eosinophils Relative: 6 %
HCT: 27.9 % — ABNORMAL LOW (ref 36.0–46.0)
Hemoglobin: 8.7 g/dL — ABNORMAL LOW (ref 12.0–15.0)
Immature Granulocytes: 0 %
Lymphocytes Relative: 25 %
Lymphs Abs: 1.5 10*3/uL (ref 0.7–4.0)
MCH: 30.4 pg (ref 26.0–34.0)
MCHC: 31.2 g/dL (ref 30.0–36.0)
MCV: 97.6 fL (ref 80.0–100.0)
Monocytes Absolute: 0.9 10*3/uL (ref 0.1–1.0)
Monocytes Relative: 15 %
Neutro Abs: 3.2 10*3/uL (ref 1.7–7.7)
Neutrophils Relative %: 53 %
Platelets: 110 10*3/uL — ABNORMAL LOW (ref 150–400)
RBC: 2.86 MIL/uL — ABNORMAL LOW (ref 3.87–5.11)
RDW: 17.9 % — ABNORMAL HIGH (ref 11.5–15.5)
WBC: 6 10*3/uL (ref 4.0–10.5)
nRBC: 0 % (ref 0.0–0.2)

## 2022-09-04 LAB — AMMONIA: Ammonia: 51 umol/L — ABNORMAL HIGH (ref 9–35)

## 2022-09-04 LAB — PHOSPHORUS: Phosphorus: 3 mg/dL (ref 2.5–4.6)

## 2022-09-04 LAB — PROTIME-INR
INR: 1.9 — ABNORMAL HIGH (ref 0.8–1.2)
Prothrombin Time: 21.9 s — ABNORMAL HIGH (ref 11.4–15.2)

## 2022-09-04 LAB — MAGNESIUM: Magnesium: 1.4 mg/dL — ABNORMAL LOW (ref 1.7–2.4)

## 2022-09-04 MED ORDER — CEFAZOLIN SODIUM-DEXTROSE 2-4 GM/100ML-% IV SOLN
2.0000 g | Freq: Three times a day (TID) | INTRAVENOUS | Status: DC
  Administered 2022-09-04 – 2022-09-05 (×3): 2 g via INTRAVENOUS
  Filled 2022-09-04 (×3): qty 100

## 2022-09-04 MED ORDER — METHOCARBAMOL 500 MG PO TABS
500.0000 mg | ORAL_TABLET | Freq: Every day | ORAL | Status: DC | PRN
  Administered 2022-09-04 – 2022-09-08 (×2): 500 mg via ORAL
  Filled 2022-09-04 (×2): qty 1

## 2022-09-04 MED ORDER — ALBUMIN HUMAN 25 % IV SOLN
25.0000 g | Freq: Once | INTRAVENOUS | Status: AC
  Administered 2022-09-04: 25 g via INTRAVENOUS
  Filled 2022-09-04: qty 100

## 2022-09-04 MED ORDER — FUROSEMIDE 10 MG/ML IJ SOLN
60.0000 mg | Freq: Two times a day (BID) | INTRAMUSCULAR | Status: DC
  Administered 2022-09-04 – 2022-09-05 (×2): 60 mg via INTRAVENOUS
  Filled 2022-09-04 (×2): qty 6

## 2022-09-04 NOTE — Progress Notes (Signed)
Daily Progress Note   Patient Name: [name]       Date: 09/04/2022 DOB: [birth year]  Age: 67 y.o. MRN#: [id number] Attending Physician: Zigmund Daniel., * Primary Care Physician: [name], MD Admit Date: 08/11/2022  Reason for Consultation/Follow-up: Establishing goals of care  Patient Profile/HPI:   67 y.o. female  with past medical history of NASH cirrhosis, portal hypertension, ascites, short gut syndrome with chronic diarrhea, pAF, HTN, HLD, hypothyroid, blind in one eye, recurrent cellulits admitted on 08/11/2022 with sepsis d/t cellulitis and flavobacterium bacteremia. Palliative consulted for GOC.   Subjective: Chart reviewed. Patient has continued to improve since my initial consult.  Noted patient with episode of hepatic encephalopathy and hyperammoniemia with ammonia elevated at 155. Much better today with restarting of lactulose.  On eval patient awake and alert. Complaining of pain in her lower back.  Spoke with son- update on current status discussed.  GOC continue to be for stabilization and discharge home under his care.    Review of Systems  Musculoskeletal:  Positive for back pain.     Physical Exam Vitals and nursing note reviewed.  Constitutional:      Appearance: She is obese. She is ill-appearing.  Skin:    Coloration: Skin is jaundiced.  Neurological:     Comments: sleepy             Vital Signs: BP (!) 141/72 (BP Location: Right Wrist)   Pulse 90   Temp 98 F (36.7 C) (Oral)   Resp 18   Ht 5\' 2"  (1.575 m)   Wt (!) 165.7 kg   SpO2 96%   BMI 66.81 kg/m  SpO2: SpO2: 96 % O2 Device: O2 Device: Room Air O2 Flow Rate:    Intake/output summary:  Intake/Output Summary (Last 24 hours) at 09/04/2022 1343 Last data filed at 09/04/2022  1000 Gross per 24 hour  Intake 240 ml  Output 1250 ml  Net -1010 ml   LBM: Last BM Date : 09/03/22 Baseline Weight: Weight: 106.6 kg Most recent weight: Weight: (!) 165.7 kg       Palliative Assessment/Data: PPS: 50%      Patient Active Problem List   Diagnosis Date Noted   Cellulitis of left lower extremity 08/18/2022   Sepsis with acute renal failure, tubular necrosis, and septic  shock (HCC) 08/18/2022   Physical deconditioning 08/17/2022   Goals of care, counseling/discussion 08/17/2022   Decompensated hepatic cirrhosis (HCC) 08/17/2022   Septic shock (HCC) 08/17/2022   Hypotension 08/11/2022   Folate deficiency 06/14/2022   Acute metabolic encephalopathy 06/13/2022   Systolic murmur 06/12/2022   Iron deficiency anemia 05/16/2022   History of eye surgery 05/01/2022   Obesity, Class III, BMI 40-49.9 (morbid obesity) (HCC) 05/01/2022   AKI (acute kidney injury) (HCC) 04/30/2022   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   GERD (gastroesophageal reflux disease) 07/25/2016   Hypothyroidism 07/25/2016    Palliative Care Assessment & Plan    Assessment/Recommendations/Plan  Back pain- likely related to compression fractures- s/p kyphoplasty- continue current medication interventions- heating pad ordered GOC have been clarified- DNR with full scope interventions PMT will continue to follow intermittently, please call if needed   Code Status: DNR  Prognosis:  Unable to determine  Discharge Planning: Home with Home Health  Care plan was discussed with patient's family and care team.  Thank you for allowing the Palliative Medicine Team to assist in the care of this patient.  Total time:  60 mins  Greater than 50%  of this time was spent counseling and coordinating care related to the above assessment and plan.  Ocie Bob, AGNP-C Palliative Medicine   Please contact Palliative Medicine Team phone at [phone] for questions and concerns.

## 2022-09-04 NOTE — Progress Notes (Signed)
Occupational Therapy Treatment Patient Details Name: [name] MRN: [id number] DOB: 18-Apr-1955 Today's Date: 09/04/2022   History of present illness Patient is a 67 yr old woman who presented with with fatigue and shortness of breath.  Currently being treated for septic shock due to cellulitis & UTI. PMH: NASH cirrhosis, HTN, PE, a fib, HTN, HLD   OT comments  Pt progressing well, meeting 4/5 OT goals which were updated accordingly. Focus on in-room mobility using RW with CGA and bathing/dressing tasks with no more than Min A needed. Pt's son present, hands on to assist and denies concerns providing assist for pt at home. Pt has all needed DME.   Encouraged elevation of breasts to decrease edema and discomfort w/ pt requesting bra use. Continue to rec HHOT at DC.      If plan is discharge home, recommend the following:  Help with stairs or ramp for entrance;Assist for transportation;A little help with bathing/dressing/bathroom;A little help with walking and/or transfers;Assistance with cooking/housework;Direct supervision/assist for medications management   Equipment Recommendations  None recommended by OT    Recommendations for Other Services      Precautions / Restrictions Precautions Precautions: Fall Restrictions Weight Bearing Restrictions: No       Mobility Bed Mobility Overal bed mobility: Needs Assistance Bed Mobility: Supine to Sit     Supine to sit: Min assist, HOB elevated     General bed mobility comments: handheld assist to lift trunk    Transfers Overall transfer level: Needs assistance Equipment used: Rolling walker (2 wheels) Transfers: Sit to/from Stand Sit to Stand: Contact guard assist           General transfer comment: from bed and recliner x 3 during ADLs     Balance Overall balance assessment: Needs assistance Sitting-balance support: Feet supported Sitting balance-Leahy Scale: Fair     Standing balance support: Bilateral upper  extremity supported, During functional activity Standing balance-Leahy Scale: Poor                             ADL either performed or assessed with clinical judgement   ADL Overall ADL's : Needs assistance/impaired     Grooming: Set up;Sitting;Wash/dry face   Upper Body Bathing: Set up;Sitting Upper Body Bathing Details (indicate cue type and reason): bathing chest, underarms Lower Body Bathing: Minimal assistance;Sitting/lateral leans;Sit to/from stand Lower Body Bathing Details (indicate cue type and reason): able to bathe peri care in standing with one seated rest break. assist for B feet due to difficulty reaching Upper Body Dressing : Set up;Sitting Upper Body Dressing Details (indicate cue type and reason): assist for bra and clean gown Lower Body Dressing: Minimal assistance;Sitting/lateral leans;Sit to/from stand Lower Body Dressing Details (indicate cue type and reason): assist for socks, does not normally wear socks             Functional mobility during ADLs: Contact guard assist;Rolling walker (2 wheels) General ADL Comments: Son present, discussed assist at home and reports no issues in providing assist, has all needed DME.    Extremity/Trunk Assessment Upper Extremity Assessment Upper Extremity Assessment: Generalized weakness;Right hand dominant   Lower Extremity Assessment Lower Extremity Assessment: Defer to PT evaluation        Vision   Vision Assessment?: Vision impaired- to be further tested in functional context Additional Comments: L eye cloudy   Perception     Praxis      Cognition Arousal: Alert Behavior During Therapy:  WFL for tasks assessed/performed Overall Cognitive Status: Within Functional Limits for tasks assessed                                 General Comments: pleasant, joking, does need encouragement to participate        Exercises      Shoulder Instructions       General Comments       Pertinent Vitals/ Pain       Pain Assessment Pain Assessment: Faces Faces Pain Scale: Hurts little more Pain Location: back Pain Descriptors / Indicators: Sore Pain Intervention(s): Monitored during session, Limited activity within patient's tolerance, Repositioned  Home Living                                          Prior Functioning/Environment              Frequency  Min 1X/week        Progress Toward Goals  OT Goals(current goals can now be found in the care plan section)  Progress towards OT goals: Progressing toward goals  Acute Rehab OT Goals Patient Stated Goal: home soon OT Goal Formulation: With patient Time For Goal Achievement: 09/18/22 Potential to Achieve Goals: Good ADL Goals Pt Will Perform Lower Body Bathing: with set-up;sitting/lateral leans;sit to/from stand;with adaptive equipment Pt Will Transfer to Toilet: with supervision;ambulating Pt Will Perform Toileting - Clothing Manipulation and hygiene: with set-up;sitting/lateral leans;sit to/from stand Pt/caregiver will Perform Home Exercise Program: Increased strength;Both right and left upper extremity;With theraband;Independently;With written HEP provided Additional ADL Goal #1: pt to increase standing tolerance during ADLs/mobility > 5 min without seated rest break  Plan      Co-evaluation                 AM-PAC OT "6 Clicks" Daily Activity     Outcome Measure   Help from another person eating meals?: None Help from another person taking care of personal grooming?: A Little Help from another person toileting, which includes using toliet, bedpan, or urinal?: A Little Help from another person bathing (including washing, rinsing, drying)?: A Little Help from another person to put on and taking off regular upper body clothing?: A Little Help from another person to put on and taking off regular lower body clothing?: A Little 6 Click Score: 19    End of Session  Equipment Utilized During Treatment: Rolling walker (2 wheels)  OT Visit Diagnosis: Muscle weakness (generalized) (M62.81);Unsteadiness on feet (R26.81)   Activity Tolerance Patient tolerated treatment well   Patient Left in chair;with call bell/phone within reach;with chair alarm set;with family/visitor present;Other (comment) (lab entering during session. pt seated in chair with feet down resting on RW while lab drawing blood. son present, reports plan to elevate pt LE before he leaves)   Nurse Communication Mobility status        Time: [phone] OT Time Calculation (min): 24 min  Charges: OT General Charges $OT Visit: 1 Visit OT Treatments $Self Care/Home Management : 23-37 mins  Bradd Canary, OTR/L Acute Rehab Services Office: [phone]   Lorre Munroe 09/04/2022, 9:53 AM

## 2022-09-04 NOTE — Plan of Care (Signed)
  Problem: Clinical Measurements: Goal: Ability to maintain clinical measurements within normal limits will improve Outcome: Progressing Goal: Will remain free from infection Outcome: Progressing   Problem: Nutrition: Goal: Adequate nutrition will be maintained Outcome: Progressing   Problem: Coping: Goal: Level of anxiety will decrease Outcome: Progressing   Problem: Elimination: Goal: Will not experience complications related to bowel motility Outcome: Progressing   Problem: Safety: Goal: Ability to remain free from injury will improve Outcome: Progressing

## 2022-09-04 NOTE — Progress Notes (Signed)
PROGRESS NOTE    [name]  QIO:[id number] DOB: [birth year] DOA: 08/11/2022 PCP: [name], MD  Chief Complaint  Patient presents with   Hypotension   Shortness of Breath    Brief Narrative:   Patient is [name] 67 y/o woman with Haruko Mersch history of cirhrosis who presented with fatigue and shortness of breath. Currently being treated for septic shock due to cellulitis with Flavobacterium.  Initially with the ICU.  Treated with pressors.  Transition to midodrine. ID was consulted on 8/4 for flava bacterium bacteremia. Palliative care was consulted on 8/8 for goals of care currently DNR. Transferred to hospitalist service on 8/9. Transferred out of the stepdown on 8/10. IV antibiotic until 8/17. Started to have vaginal bleeding on 8/12, resolved.  Assessment & Plan:   Principal Problem:   Septic shock (HCC) Active Problems:   AKI (acute kidney injury) (HCC)   Hypotension   Physical deconditioning   Goals of care, counseling/discussion   Decompensated hepatic cirrhosis (HCC)   Cellulitis of left lower extremity   Sepsis with acute renal failure, tubular necrosis, and septic shock (HCC)  Anasarca Diuresis as noted below Strict I/O, daily weights  Cellulitis Difficult as anasarca on exam, but R breast seemed to have more redness and tenderness on exam  Right Hip Pain Difficult exam due to body habitus, not fluctuance noted, no significant overlying redness, follow CT abd/pelvis  Decompensated NASH cirrhosis with ascites Coagulopathy, anemia, thrombocytopenia, anasarca Acute Hepatic Encephalopathy  H/o GIB  -Continue rifaximin,outpatient GI follow-up -lactulose scheduled -follow LFT's  -Patient appears to have anasarca with hypoalbuminemia, diuretics were held during admission due to AKI, hypotension -BNP 614.9, CT chest with bilateral pleural effusions, ascites, diffuse chest wall/body wall edema -Placed on Lasix 60 mg IV BID, continue spironolactone - titrate as  tolerated  -she has abdominal discomfort, r/o SBP -> paracentesis ordered -> [place] with only mild ascites, no para done -asterixis improved today, continue lactulose  Acute on chronic HFpEF, hypertension Moderate aortic stenosis -2D echo 06/2022> EF 60-65%, G1DD, mod AS -CT chest showed bilateral pleural effusions, patchy airspace disease in RUL and superior segment of LLL concerning for multifocal pneumonia (suspect this maybe due to volume overload with lack of respiratory symptoms or infectious symptoms at this time), difufse chest wall, body wall edema -BNP 614.9, started on Lasix 60 mg IV BID, spironolactone 25 mg daily -Strict I's and O's and daily weights  Septic shock (HCC) with AKI, multifactorial Flavobacterium bacteremia, LLE cellulitis and polymicrobial UTI Concern for cat scratch fever -Met criteria on admission with lactic acidosis, encephalopathy, AKI, hypotension, tachycardia and tachypnea -Patient was admitted to ICU, was placed on pressors, fluids and IV albumin -Patient was treated with cefepime and Zithromax (for 5 days to cover cat scratch/Bartonella) -Blood cultures positive for flavobacterium. - ID was consulted, placed on meropenem for 2 weeks, completed on 08/26/2022.  Discussed with ID by my partner, does not need any further antibiotics. -CT abdomen 8/15 showed pancolitis -C. difficile PCR negative, GI pathogen panel negative   Acute on chronic back pain -Due to bacteremia, acute on chronic back pain, MRI T-spine, L-spine was obtained - motion artifact -> no evidence of discitis or osteomyelitis -> acute compression fractures of T3 and T5 vertebral bodies with marrow edema, moderate height loss at T3, mild to moderate height loss at T5 -planned for kyphoplasty today, this was canceled (due to weight?, note pending from IR) -will add on calcitonin. APAP prn, lidocaine patch.  Oxycodone prn.  AKI with uremia, metabolic acidosis, - no obstruction or hydronephrosis  on CT imaging, presumed due to septic shock  - Treated with IV pressors, IV fluids.  - Renal function has improved - follow on lasix    Coagulopathy. Postmenopausal vaginal bleeding. Macrocytic anemia with acute on chronic blood loss anemia - Due to cirrhosis her INR has remained elevated. - Patient was started on heparin in the ICU for DVT prophylaxis, subsequently started having postmenopausal vaginal bleeding. -Received Vitamin K x 1 on 8/12 x 5 days.  Heparin held, CT abdomen did not show any RPH -Pelvic ultrasound showed myomatous uterus with several fibroids - Dr Allena Katz discussed with hematology and GYN on-call, received tranexamic acid twice daily, I d/w Dr Debroah Loop (GYN on-call), on 8/15 recommended Megace 40 mg p.o. twice daily for 2 weeks, will arrange outpatient follow-up at OB/GYN center for further evaluation outpatient. -Vaginal bleeding has resolved.   Physical deconditioning -PT evaluation   Hypothyroidism -Continue synthroid   Depression -Continue effexor    Headache. No focal deficits. Continue Tylenol.   Chronic right eye pain on movement towards medial side. Resolved. No red flag sign. Needs outpatient follow up   Chronic left ear pain. Outpatient follow-up with ENT recommended.   Right thyroid nodule 1.5 cm -Incidental seen on CT chest, recommend nonemergent thyroid ultrasound   Obesity Body mass index is 66.81 kg/m.      DVT prophylaxis: SCD Code Status: DNR Family Communication: son 8/21 Disposition:   Status is: Inpatient Remains inpatient appropriate because: continued need for inpatient care   Consultants:  CCM ID gyn  Procedures:  none  Antimicrobials:  Anti-infectives (From admission, onward)    Start     Dose/Rate Route Frequency Ordered Stop   09/04/22 1415  ceFAZolin (ANCEF) IVPB 2g/100 mL premix        2 g 200 mL/hr over 30 Minutes Intravenous Every 8 hours 09/04/22 1324     08/31/22 1030  vancomycin (VANCOREADY) IVPB  1500 mg/300 mL        1,500 mg 150 mL/hr over 120 Minutes Intravenous To Radiology 08/30/22 2001 09/01/22 1030   08/20/22 1200  meropenem (MERREM) 1 g in sodium chloride 0.9 % 100 mL IVPB  Status:  Discontinued        1 g 200 mL/hr over 30 Minutes Intravenous Every 8 hours 08/20/22 1101 08/26/22 1453   08/14/22 1800  azithromycin (ZITHROMAX) 250 mg in dextrose 5 % 125 mL IVPB  Status:  Discontinued        250 mg 127.5 mL/hr over 60 Minutes Intravenous Daily 08/14/22 1738 08/14/22 1752   08/14/22 1800  azithromycin (ZITHROMAX) 500 mg in sodium chloride 0.9 % 250 mL IVPB  Status:  Discontinued        500 mg 250 mL/hr over 60 Minutes Intravenous Daily 08/14/22 1752 08/15/22 1054   08/14/22 1500  meropenem (MERREM) 1 g in sodium chloride 0.9 % 100 mL IVPB  Status:  Discontinued        1 g 200 mL/hr over 30 Minutes Intravenous Every 12 hours 08/14/22 1354 08/20/22 1101   08/13/22 2100  ceFEPIme (MAXIPIME) 2 g in sodium chloride 0.9 % 100 mL IVPB  Status:  Discontinued        2 g 200 mL/hr over 30 Minutes Intravenous Every 24 hours 08/13/22 0436 08/13/22 1944   08/13/22 2100  Cefiderocol Sulfate Tosylate (FETROJA) 1 g in sodium chloride 0.9 % 100 mL IVPB  Status:  Discontinued  1 g 37.1 mL/hr over 3 Hours Intravenous Every 8 hours 08/13/22 1944 08/14/22 1353   08/13/22 0800  vancomycin (VANCOREADY) IVPB 1250 mg/250 mL  Status:  Discontinued        1,250 mg 166.7 mL/hr over 90 Minutes Intravenous Every 48 hours 08/11/22 1415 08/12/22 0717   08/12/22 2200  ceFEPIme (MAXIPIME) 2 g in sodium chloride 0.9 % 100 mL IVPB  Status:  Discontinued        2 g 200 mL/hr over 30 Minutes Intravenous Every 12 hours 08/12/22 1743 08/13/22 0436   08/12/22 1000  azithromycin (ZITHROMAX) tablet 250 mg  Status:  Discontinued       Placed in "Followed by" Linked Group   250 mg Oral Daily 08/11/22 1353 08/14/22 1738   08/11/22 2000  ceFEPIme (MAXIPIME) 2 g in sodium chloride 0.9 % 100 mL IVPB  Status:   Discontinued        2 g 200 mL/hr over 30 Minutes Intravenous Every 12 hours 08/11/22 1415 08/12/22 1743   08/11/22 1500  rifaximin (XIFAXAN) tablet 550 mg        550 mg Oral 2 times daily 08/11/22 1413     08/11/22 1445  azithromycin (ZITHROMAX) tablet 500 mg       Placed in "Followed by" Linked Group   500 mg Oral  Once 08/11/22 1353 08/11/22 1501   08/11/22 0745  vancomycin (VANCOCIN) IVPB 1000 mg/200 mL premix  Status:  Discontinued        1,000 mg 200 mL/hr over 60 Minutes Intravenous  Once 08/11/22 0739 08/11/22 0740   08/11/22 0745  clindamycin (CLEOCIN) IVPB 600 mg        600 mg 100 mL/hr over 30 Minutes Intravenous  Once 08/11/22 0739 08/11/22 1004   08/11/22 0745  vancomycin (VANCOREADY) IVPB 2000 mg/400 mL        2,000 mg 200 mL/hr over 120 Minutes Intravenous STAT 08/11/22 0740 08/11/22 1030   08/11/22 0745  meropenem (MERREM) 1 g in sodium chloride 0.9 % 100 mL IVPB        1 g 200 mL/hr over 30 Minutes Intravenous STAT 08/11/22 0740 08/11/22 0914       Subjective: C/o R hip pain   Objective: Vitals:   09/03/22 0500 09/03/22 1425 09/04/22 0416 09/04/22 0611  BP:  (!) 146/85  (!) 141/72  Pulse:  (!) 103  90  Resp:  16  18  Temp:  98.2 F (36.8 C)  98 F (36.7 C)  TempSrc:  Oral  Oral  SpO2:  99%  96%  Weight: (!) 165.8 kg  (!) 165.7 kg   Height:        Intake/Output Summary (Last 24 hours) at 09/04/2022 1328 Last data filed at 09/04/2022 1000 Gross per 24 hour  Intake 240 ml  Output 1250 ml  Net -1010 ml    Filed Weights   09/01/22 0635 09/03/22 0500 09/04/22 0416  Weight: (!) 165.9 kg (!) 165.8 kg (!) 165.7 kg    Examination:  General: No acute distress. Cardiovascular: RRR Lungs: unlabored Neurological: Alert and oriented 3. Moves all extremities 4 with equal strength. Cranial nerves II through XII grossly intact. Skin: erythema noted to R breast > L, diffuse skin thickening/edema, TTP more on R breast Extremities: anasarca, tender to  palpation at bilateral hips (greater on the R) - difficult to examine due to habitus    Data Reviewed: I have personally reviewed following labs and imaging studies  CBC: Recent Labs  Lab 08/30/22 0354 08/31/22 0128 09/01/22 0523 09/02/22 0501 09/03/22 1520  WBC 5.1 3.8* 6.6 5.9 4.9  NEUTROABS  --   --  4.6 2.8 2.1  HGB 8.2* 8.9* 8.7* 8.6* 9.1*  HCT 26.4* 28.7* 28.2* 28.4* 31.3*  MCV 97.8 95.3 97.2 97.3 102.3*  PLT 87* 84* 111* 93* 96*    Basic Metabolic Panel: Recent Labs  Lab 08/30/22 0354 08/31/22 0128 09/01/22 0523 09/02/22 0501 09/03/22 1520  NA 140 141 139 140 139  K 3.7 4.1 3.6 3.5 3.3*  CL 111 109 110 108 109  CO2 24 24 23 25 24   GLUCOSE 102* 110* 160* 102* 99  BUN 24* 20 24* 25* 18  CREATININE 0.94 0.95 1.09* 1.14* 1.01*  CALCIUM 7.8* 8.2* 7.9* 8.0* 7.8*  MG  --   --  1.5* 2.0 1.7  PHOS  --   --  3.5 3.4 3.1    GFR: Estimated Creatinine Clearance: 85.5 mL/min (Samy Ryner) (by C-G formula based on SCr of 1.01 mg/dL (H)).  Liver Function Tests: Recent Labs  Lab 08/29/22 0249 09/01/22 0523 09/02/22 0501 09/03/22 1520  AST 26 23 21 22   ALT 18 19 18 18   ALKPHOS 145* 172* 160* 155*  BILITOT 2.2* 2.0* 2.1* 2.9*  PROT 5.2* 5.3* 5.4* 5.3*  ALBUMIN 2.4* 2.4* 2.5* 2.4*    CBG: No results for input(s): "GLUCAP" in the last 168 hours.   Recent Results (from the past 240 hour(s))  C Difficile Quick Screen (NO PCR Reflex)     Status: None   Collection Time: 08/26/22  8:12 PM   Specimen: STOOL  Result Value Ref Range Status   C Diff antigen NEGATIVE NEGATIVE Final   C Diff toxin NEGATIVE NEGATIVE Final   C Diff interpretation No C. difficile detected.  Final    Comment: Performed at [facility], [address], [place], [place] [address]  Gastrointestinal Panel by PCR , Stool     Status: None   Collection Time: 08/26/22  8:12 PM   Specimen: Stool  Result Value Ref Range Status   Campylobacter species NOT DETECTED NOT DETECTED Final    Plesimonas shigelloides NOT DETECTED NOT DETECTED Final   Salmonella species NOT DETECTED NOT DETECTED Final   Yersinia enterocolitica NOT DETECTED NOT DETECTED Final   Vibrio species NOT DETECTED NOT DETECTED Final   Vibrio cholerae NOT DETECTED NOT DETECTED Final   Enteroaggregative E coli (EAEC) NOT DETECTED NOT DETECTED Final   Enteropathogenic E coli (EPEC) NOT DETECTED NOT DETECTED Final   Enterotoxigenic E coli (ETEC) NOT DETECTED NOT DETECTED Final   Shiga like toxin producing E coli (STEC) NOT DETECTED NOT DETECTED Final   Shigella/Enteroinvasive E coli (EIEC) NOT DETECTED NOT DETECTED Final   Cryptosporidium NOT DETECTED NOT DETECTED Final   Cyclospora cayetanensis NOT DETECTED NOT DETECTED Final   Entamoeba histolytica NOT DETECTED NOT DETECTED Final   Giardia lamblia NOT DETECTED NOT DETECTED Final   Adenovirus F40/41 NOT DETECTED NOT DETECTED Final   Astrovirus NOT DETECTED NOT DETECTED Final   Norovirus GI/GII NOT DETECTED NOT DETECTED Final   Rotavirus Donicia Druck NOT DETECTED NOT DETECTED Final   Sapovirus (I, II, IV, and V) NOT DETECTED NOT DETECTED Final    Comment: Performed at [facility], [address] [place], [place] [address]  Urine Culture (for pregnant, neutropenic or urologic patients or patients with an indwelling urinary catheter)     Status: None (Preliminary result)   Collection Time: 09/03/22  4:23  PM   Specimen: Urine, Clean Catch  Result Value Ref Range Status   Specimen Description   Final    URINE, CLEAN CATCH Performed at [facility], [address], [place], [place] [address]    Special Requests   Final    NONE Performed at [facility], [address], [place], [place] [address]    Culture   Final    CULTURE REINCUBATED FOR BETTER GROWTH Performed at [facility], [address] [place], [place] [address]    Report Status PENDING  Incomplete         Radiology Studies: No results  found.      Scheduled Meds:  calcitonin (salmon)  1 spray Alternating Nares Daily   dicyclomine  10 mg Oral TID AC   diphenhydrAMINE  50 mg Oral Once   famotidine  20 mg Oral QAC breakfast   feeding supplement (KATE FARMS STANDARD 1.4)  325 mL Oral Daily   furosemide  40 mg Intravenous BID   ketotifen  1 drop Right Eye BID   lactulose  20 g Oral TID   levothyroxine  150 mcg Oral QAC breakfast   lidocaine  2 patch Transdermal Q24H   magic mouthwash  5 mL Oral QID   megestrol  40 mg Oral BID   midodrine  10 mg Oral TID WC   nystatin cream   Topical BID   pantoprazole  40 mg Oral BID AC   rifaximin  550 mg Oral BID   rOPINIRole  0.25 mg Oral QHS   sodium chloride flush  10-40 mL Intracatheter Q12H   spironolactone  25 mg Oral Daily   tamsulosin  0.4 mg Oral Daily   venlafaxine XR  75 mg Oral Q breakfast   zinc sulfate  220 mg Oral Daily   Continuous Infusions:  sodium chloride 250 mL (08/21/22 1447)    ceFAZolin (ANCEF) IV       LOS: 24 days    Time spent: over 30 min    Lacretia Nicks, MD [facility]   To contact the attending provider between 7A-7P or the covering provider during after hours 7P-7A, please log into the web site [email or web address] and access using universal Early password for that web site. If you do not have the password, please call the hospital operator.  09/04/2022, 1:28 PM

## 2022-09-05 ENCOUNTER — Ambulatory Visit: Payer: Medicare (Managed Care) | Admitting: Internal Medicine

## 2022-09-05 DIAGNOSIS — A419 Sepsis, unspecified organism: Secondary | ICD-10-CM | POA: Diagnosis not present

## 2022-09-05 DIAGNOSIS — R6521 Severe sepsis with septic shock: Secondary | ICD-10-CM | POA: Diagnosis not present

## 2022-09-05 LAB — CBC WITH DIFFERENTIAL/PLATELET
Abs Immature Granulocytes: 0 10*3/uL (ref 0.00–0.07)
Basophils Absolute: 0.1 10*3/uL (ref 0.0–0.1)
Basophils Relative: 1 %
Eosinophils Absolute: 0.3 10*3/uL (ref 0.0–0.5)
Eosinophils Relative: 4 %
HCT: 27.3 % — ABNORMAL LOW (ref 36.0–46.0)
Hemoglobin: 8.8 g/dL — ABNORMAL LOW (ref 12.0–15.0)
Lymphocytes Relative: 15 %
Lymphs Abs: 1.1 10*3/uL (ref 0.7–4.0)
MCH: 30.3 pg (ref 26.0–34.0)
MCHC: 32.2 g/dL (ref 30.0–36.0)
MCV: 94.1 fL (ref 80.0–100.0)
Monocytes Absolute: 0.2 10*3/uL (ref 0.1–1.0)
Monocytes Relative: 3 %
Neutro Abs: 5.5 10*3/uL (ref 1.7–7.7)
Neutrophils Relative %: 77 %
Platelets: 135 10*3/uL — ABNORMAL LOW (ref 150–400)
RBC: 2.9 MIL/uL — ABNORMAL LOW (ref 3.87–5.11)
RDW: 17.9 % — ABNORMAL HIGH (ref 11.5–15.5)
WBC: 7.1 10*3/uL (ref 4.0–10.5)
nRBC: 0 % (ref 0.0–0.2)

## 2022-09-05 LAB — COMPREHENSIVE METABOLIC PANEL
ALT: 13 U/L (ref 0–44)
AST: 24 U/L (ref 15–41)
Albumin: 2.5 g/dL — ABNORMAL LOW (ref 3.5–5.0)
Alkaline Phosphatase: 152 U/L — ABNORMAL HIGH (ref 38–126)
Anion gap: 15 (ref 5–15)
BUN: 13 mg/dL (ref 8–23)
CO2: 26 mmol/L (ref 22–32)
Calcium: 8 mg/dL — ABNORMAL LOW (ref 8.9–10.3)
Chloride: 101 mmol/L (ref 98–111)
Creatinine, Ser: 1.04 mg/dL — ABNORMAL HIGH (ref 0.44–1.00)
GFR, Estimated: >60 mL/min (ref >60)
Glucose, Bld: 106 mg/dL — ABNORMAL HIGH (ref 70–99)
Potassium: 3.5 mmol/L (ref 3.5–5.1)
Sodium: 142 mmol/L (ref 135–145)
Total Bilirubin: 2.4 mg/dL — ABNORMAL HIGH (ref 0.3–1.2)
Total Protein: 5.5 g/dL — ABNORMAL LOW (ref 6.5–8.1)

## 2022-09-05 LAB — PHOSPHORUS: Phosphorus: 3.2 mg/dL (ref 2.5–4.6)

## 2022-09-05 LAB — PROTIME-INR
INR: 1.9 — ABNORMAL HIGH (ref 0.8–1.2)
Prothrombin Time: 21.7 s — ABNORMAL HIGH (ref 11.4–15.2)

## 2022-09-05 LAB — MAGNESIUM: Magnesium: 1.3 mg/dL — ABNORMAL LOW (ref 1.7–2.4)

## 2022-09-05 MED ORDER — OXYCODONE HCL 5 MG PO TABS
5.0000 mg | ORAL_TABLET | ORAL | Status: DC | PRN
  Administered 2022-09-05 – 2022-09-08 (×7): 5 mg via ORAL
  Filled 2022-09-05 (×7): qty 1

## 2022-09-05 MED ORDER — CARBAMIDE PEROXIDE 6.5 % OT SOLN: 5.0000 [drp] | Freq: Two times a day (BID) | OTIC | Status: DC

## 2022-09-05 MED ORDER — POTASSIUM CHLORIDE CRYS ER 20 MEQ PO TBCR
40.0000 meq | EXTENDED_RELEASE_TABLET | ORAL | Status: AC
  Administered 2022-09-05 (×2): 40 meq via ORAL
  Filled 2022-09-05 (×2): qty 2

## 2022-09-05 MED ORDER — MAGNESIUM SULFATE 4 GM/100ML IV SOLN
4.0000 g | Freq: Once | INTRAVENOUS | Status: AC
  Administered 2022-09-05: 4 g via INTRAVENOUS
  Filled 2022-09-05: qty 100

## 2022-09-05 MED ORDER — FUROSEMIDE 10 MG/ML IJ SOLN
60.0000 mg | Freq: Three times a day (TID) | INTRAMUSCULAR | Status: DC
  Administered 2022-09-05 – 2022-09-07 (×7): 60 mg via INTRAVENOUS
  Filled 2022-09-05 (×8): qty 6

## 2022-09-05 MED ORDER — OXYCODONE HCL 5 MG PO TABS
10.0000 mg | ORAL_TABLET | ORAL | Status: DC | PRN
  Administered 2022-09-08 – 2022-09-09 (×2): 10 mg via ORAL
  Filled 2022-09-05 (×3): qty 2

## 2022-09-05 MED ORDER — FLUTICASONE PROPIONATE 50 MCG/ACT NA SUSP
2.0000 | Freq: Every day | NASAL | Status: DC
  Administered 2022-09-05 – 2022-09-09 (×5): 2 via NASAL
  Filled 2022-09-05: qty 16

## 2022-09-05 MED ORDER — CARBAMIDE PEROXIDE 6.5 % OT SOLN
5.0000 [drp] | Freq: Two times a day (BID) | OTIC | Status: AC
  Administered 2022-09-05 – 2022-09-08 (×8): 5 [drp] via OTIC
  Filled 2022-09-05: qty 15

## 2022-09-05 MED ORDER — SODIUM CHLORIDE 0.9 % IV SOLN
2.0000 g | INTRAVENOUS | Status: DC
  Administered 2022-09-05 – 2022-09-09 (×5): 2 g via INTRAVENOUS
  Filled 2022-09-05 (×5): qty 20

## 2022-09-05 NOTE — Progress Notes (Signed)
PT Cancellation Note  Patient Details Name: [name] MRN: [id number] DOB: [birth year]   Cancelled Treatment:    Reason Eval/Treat Not Completed: Pain limiting ability to participate. PT returned to engage pt with therapeutic activity at 1330 and pt in bed eating lunch, pt continues to report 10/10 back pain and pain mediation provided prior to PT arrival. PT returned in evening 1804 and pt in bed, pt indicated that her back was a little better and that now it was her shoulders that hurt and her ear. Pt unable to participate with PT. PT to continue to follow acutely.   [name], PT Acute Rehab   [name] 09/05/2022, 6:08 PM

## 2022-09-05 NOTE — Progress Notes (Signed)
PROGRESS NOTE    [name]  RUE:[id number] DOB: [birth year] DOA: 08/11/2022 PCP: [name], MD  Chief Complaint  Patient presents with   Hypotension   Shortness of Breath    Brief Narrative:   Patient is [name] 67 y/o woman with Mandeep Ferch history of cirhrosis who presented with fatigue and shortness of breath. Currently being treated for septic shock due to cellulitis with Flavobacterium.  Initially with the ICU.  Treated with pressors.  Transition to midodrine. ID was consulted on 8/4 for flava bacterium bacteremia. Palliative care was consulted on 8/8 for goals of care currently DNR. Transferred to hospitalist service on 8/9. Transferred out of the stepdown on 8/10. IV antibiotic until 8/17. Started to have vaginal bleeding on 8/12, resolved.  Assessment & Plan:   Principal Problem:   Septic shock (HCC) Active Problems:   AKI (acute kidney injury) (HCC)   Hypotension   Physical deconditioning   Goals of care, counseling/discussion   Decompensated hepatic cirrhosis (HCC)   Cellulitis of left lower extremity   Sepsis with acute renal failure, tubular necrosis, and septic shock (HCC)  Anasarca Aggressive diuresis as noted below She is grossly overloaded with significant dependent edema in her hips/thighs as well as swelling in her breasts (lower extremity edema is less impressive, though present) Strict I/O, daily weights  Cellulitis Difficult as anasarca on exam, but R breast seemed to have more redness and tenderness on exam Follow with abx and diuresis   Right Hip Pain CT abd/pelvis without notable changes  Klebsiella Pneumoniae UTI Ceftriaxone Follow final culture results   Decompensated NASH cirrhosis with ascites Coagulopathy, anemia, thrombocytopenia, anasarca Acute Hepatic Encephalopathy  H/o GIB  -Continue rifaximin,outpatient GI follow-up -lactulose scheduled -follow LFT's  -Patient appears to have anasarca with hypoalbuminemia, diuretics were held  during admission due to AKI, hypotension -BNP 614.9, CT chest with bilateral pleural effusions, ascites, diffuse chest wall/body wall edema -Placed on Lasix 60 mg IV BID, continue spironolactone - titrate as tolerated  -she has abdominal discomfort, r/o SBP -> paracentesis ordered -> [place] with only mild ascites, no para done -asterixis improved today, continue lactulose (goal 2-3 BM daily)   Acute on chronic HFpEF, hypertension Moderate aortic stenosis -2D echo 06/2022> EF 60-65%, G1DD, mod AS -CT chest showed bilateral pleural effusions, patchy airspace disease in RUL and superior segment of LLL concerning for multifocal pneumonia (suspect this maybe due to volume overload with lack of respiratory symptoms or infectious symptoms at this time), diffuse chest wall, body wall edema -BNP 614.9, Lasix 60 mg IV TID, spironolactone 25 mg daily -Strict I's and O's and daily weights  Septic shock (HCC) with AKI, multifactorial Flavobacterium bacteremia, LLE cellulitis and polymicrobial UTI Concern for cat scratch fever -Met criteria on admission with lactic acidosis, encephalopathy, AKI, hypotension, tachycardia and tachypnea -Patient was admitted to ICU, was placed on pressors, fluids and IV albumin -Patient was treated with cefepime and Zithromax (for 5 days to cover cat scratch/Bartonella) -Blood cultures positive for flavobacterium. - ID was consulted, placed on meropenem for 2 weeks, completed on 08/26/2022.  Discussed with ID by my partner, does not need any further antibiotics. -CT abdomen 8/15 showed pancolitis -C. difficile PCR negative, GI pathogen panel negative   Acute on chronic back pain -Due to bacteremia, acute on chronic back pain, MRI T-spine, L-spine was obtained - motion artifact -> no evidence of discitis or osteomyelitis -> acute compression fractures of T3 and T5 vertebral bodies with marrow edema, moderate height loss at T3,  mild to moderate height loss at T5 -planned for  kyphoplasty 8/21, this was canceled (due to weight?, note pending from IR) -will add on calcitonin. APAP prn, lidocaine patch.  Oxycodone prn.     AKI with uremia, metabolic acidosis, - no obstruction or hydronephrosis on CT imaging, presumed due to septic shock  - Treated with IV pressors, IV fluids.  - Renal function has improved - follow closely as we aggressively diurese her    Coagulopathy. Postmenopausal vaginal bleeding. Macrocytic anemia with acute on chronic blood loss anemia - Due to cirrhosis her INR has remained elevated. - Patient was started on heparin in the ICU for DVT prophylaxis, subsequently started having postmenopausal vaginal bleeding. -Received Vitamin K x 1 on 8/12 x 5 days.  Heparin held, CT abdomen did not show any RPH -Pelvic ultrasound showed myomatous uterus with several fibroids - Dr Allena Katz discussed with hematology and GYN on-call, received tranexamic acid twice daily.  My partner discussed with Dr Debroah Loop (GYN on-call), on 8/15 recommended Megace 40 mg p.o. twice daily for 2 weeks, will arrange outpatient follow-up at OB/GYN center for further evaluation outpatient. -Vaginal bleeding has resolved.   Physical deconditioning -PT evaluation   Hypothyroidism -Continue synthroid   Depression -Continue effexor    Headache. No focal deficits. Continue Tylenol.   Chronic right eye pain on movement towards medial side. Resolved. No red flag sign. Needs outpatient follow up   Ear Pain L TM normal appearing, R TM obscurred by cerumen (c/o R ear pain 8/26) Debrox flonase Outpatient follow-up with ENT recommended.   Right thyroid nodule 1.5 cm -Incidental seen on CT chest, recommend nonemergent thyroid ultrasound   Obesity Body mass index is 66.81 kg/m.      DVT prophylaxis: SCD Code Status: DNR Family Communication: son 8/21 Disposition:   Status is: Inpatient Remains inpatient appropriate because: continued need for inpatient care    Consultants:  CCM ID gyn  Procedures:  none  Antimicrobials:  Anti-infectives (From admission, onward)    Start     Dose/Rate Route Frequency Ordered Stop   09/05/22 1000  cefTRIAXone (ROCEPHIN) 2 g in sodium chloride 0.9 % 100 mL IVPB        2 g 200 mL/hr over 30 Minutes Intravenous Every 24 hours 09/05/22 0802     09/04/22 1415  ceFAZolin (ANCEF) IVPB 2g/100 mL premix  Status:  Discontinued        2 g 200 mL/hr over 30 Minutes Intravenous Every 8 hours 09/04/22 1324 09/05/22 0802   08/31/22 1030  vancomycin (VANCOREADY) IVPB 1500 mg/300 mL        1,500 mg 150 mL/hr over 120 Minutes Intravenous To Radiology 08/30/22 2001 09/01/22 1030   08/20/22 1200  meropenem (MERREM) 1 g in sodium chloride 0.9 % 100 mL IVPB  Status:  Discontinued        1 g 200 mL/hr over 30 Minutes Intravenous Every 8 hours 08/20/22 1101 08/26/22 1453   08/14/22 1800  azithromycin (ZITHROMAX) 250 mg in dextrose 5 % 125 mL IVPB  Status:  Discontinued        250 mg 127.5 mL/hr over 60 Minutes Intravenous Daily 08/14/22 1738 08/14/22 1752   08/14/22 1800  azithromycin (ZITHROMAX) 500 mg in sodium chloride 0.9 % 250 mL IVPB  Status:  Discontinued        500 mg 250 mL/hr over 60 Minutes Intravenous Daily 08/14/22 1752 08/15/22 1054   08/14/22 1500  meropenem (MERREM) 1 g in sodium chloride 0.9 %  100 mL IVPB  Status:  Discontinued        1 g 200 mL/hr over 30 Minutes Intravenous Every 12 hours 08/14/22 1354 08/20/22 1101   08/13/22 2100  ceFEPIme (MAXIPIME) 2 g in sodium chloride 0.9 % 100 mL IVPB  Status:  Discontinued        2 g 200 mL/hr over 30 Minutes Intravenous Every 24 hours 08/13/22 0436 08/13/22 1944   08/13/22 2100  Cefiderocol Sulfate Tosylate (FETROJA) 1 g in sodium chloride 0.9 % 100 mL IVPB  Status:  Discontinued        1 g 37.1 mL/hr over 3 Hours Intravenous Every 8 hours 08/13/22 1944 08/14/22 1353   08/13/22 0800  vancomycin (VANCOREADY) IVPB 1250 mg/250 mL  Status:  Discontinued         1,250 mg 166.7 mL/hr over 90 Minutes Intravenous Every 48 hours 08/11/22 1415 08/12/22 0717   08/12/22 2200  ceFEPIme (MAXIPIME) 2 g in sodium chloride 0.9 % 100 mL IVPB  Status:  Discontinued        2 g 200 mL/hr over 30 Minutes Intravenous Every 12 hours 08/12/22 1743 08/13/22 0436   08/12/22 1000  azithromycin (ZITHROMAX) tablet 250 mg  Status:  Discontinued       Placed in "Followed by" Linked Group   250 mg Oral Daily 08/11/22 1353 08/14/22 1738   08/11/22 2000  ceFEPIme (MAXIPIME) 2 g in sodium chloride 0.9 % 100 mL IVPB  Status:  Discontinued        2 g 200 mL/hr over 30 Minutes Intravenous Every 12 hours 08/11/22 1415 08/12/22 1743   08/11/22 1500  rifaximin (XIFAXAN) tablet 550 mg        550 mg Oral 2 times daily 08/11/22 1413     08/11/22 1445  azithromycin (ZITHROMAX) tablet 500 mg       Placed in "Followed by" Linked Group   500 mg Oral  Once 08/11/22 1353 08/11/22 1501   08/11/22 0745  vancomycin (VANCOCIN) IVPB 1000 mg/200 mL premix  Status:  Discontinued        1,000 mg 200 mL/hr over 60 Minutes Intravenous  Once 08/11/22 0739 08/11/22 0740   08/11/22 0745  clindamycin (CLEOCIN) IVPB 600 mg        600 mg 100 mL/hr over 30 Minutes Intravenous  Once 08/11/22 0739 08/11/22 1004   08/11/22 0745  vancomycin (VANCOREADY) IVPB 2000 mg/400 mL        2,000 mg 200 mL/hr over 120 Minutes Intravenous STAT 08/11/22 0740 08/11/22 1030   08/11/22 0745  meropenem (MERREM) 1 g in sodium chloride 0.9 % 100 mL IVPB        1 g 200 mL/hr over 30 Minutes Intravenous STAT 08/11/22 0740 08/11/22 0914       Subjective: C/o worsening back pain  Objective: Vitals:   09/04/22 0611 09/04/22 1345 09/05/22 0553 09/05/22 1206  BP: (!) 141/72 127/69 127/78 120/81  Pulse: 90 82 90 85  Resp: 18 18 18 19   Temp: 98 F (36.7 C) 97.8 F (36.6 C) 98.3 F (36.8 C) 98.2 F (36.8 C)  TempSrc: Oral Oral Oral Oral  SpO2: 96% 99% 96% 99%  Weight:      Height:        Intake/Output Summary (Last  24 hours) at 09/05/2022 1353 Last data filed at 09/05/2022 9528 Gross per 24 hour  Intake 393.38 ml  Output 2900 ml  Net -2506.62 ml    Filed Weights   09/01/22  4696 09/03/22 0500 09/04/22 0416  Weight: (!) 165.9 kg (!) 165.8 kg (!) 165.7 kg    Examination:  General: No acute distress. R TM obscured by cerumen Cardiovascular: Heart sounds show Ann Bohne regular rate, and rhythm. No gallops or rubs. No murmurs. No JVD. Lungs: unlabored Neurological: Alert and oriented 3. Moves all extremities 4 with equal strength. Cranial nerves II through XII grossly intact. Extremities: anasarca   Data Reviewed: I have personally reviewed following labs and imaging studies  CBC: Recent Labs  Lab 08/31/22 0128 09/01/22 0523 09/02/22 0501 09/03/22 1520 09/04/22 2024  WBC 3.8* 6.6 5.9 4.9 6.0  NEUTROABS  --  4.6 2.8 2.1 3.2  HGB 8.9* 8.7* 8.6* 9.1* 8.7*  HCT 28.7* 28.2* 28.4* 31.3* 27.9*  MCV 95.3 97.2 97.3 102.3* 97.6  PLT 84* 111* 93* 96* 110*    Basic Metabolic Panel: Recent Labs  Lab 08/31/22 0128 09/01/22 0523 09/02/22 0501 09/03/22 1520 09/04/22 2024  NA 141 139 140 139 142  K 4.1 3.6 3.5 3.3* 3.1*  CL 109 110 108 109 103  CO2 24 23 25 24 28   GLUCOSE 110* 160* 102* 99 103*  BUN 20 24* 25* 18 15  CREATININE 0.95 1.09* 1.14* 1.01* 1.13*  CALCIUM 8.2* 7.9* 8.0* 7.8* 8.1*  MG  --  1.5* 2.0 1.7 1.4*  PHOS  --  3.5 3.4 3.1 3.0    GFR: Estimated Creatinine Clearance: 76.5 mL/min (Maclovia Uher) (by C-G formula based on SCr of 1.13 mg/dL (H)).  Liver Function Tests: Recent Labs  Lab 09/01/22 0523 09/02/22 0501 09/03/22 1520 09/04/22 2024  AST 23 21 22 21   ALT 19 18 18 16   ALKPHOS 172* 160* 155* 157*  BILITOT 2.0* 2.1* 2.9* 2.3*  PROT 5.3* 5.4* 5.3* 5.3*  ALBUMIN 2.4* 2.5* 2.4* 2.4*    CBG: No results for input(s): "GLUCAP" in the last 168 hours.   Recent Results (from the past 240 hour(s))  C Difficile Quick Screen (NO PCR Reflex)     Status: None   Collection Time:  08/26/22  8:12 PM   Specimen: STOOL  Result Value Ref Range Status   C Diff antigen NEGATIVE NEGATIVE Final   C Diff toxin NEGATIVE NEGATIVE Final   C Diff interpretation No C. difficile detected.  Final    Comment: Performed at [facility], [address], [place], [place] [address]  Gastrointestinal Panel by PCR , Stool     Status: None   Collection Time: 08/26/22  8:12 PM   Specimen: Stool  Result Value Ref Range Status   Campylobacter species NOT DETECTED NOT DETECTED Final   Plesimonas shigelloides NOT DETECTED NOT DETECTED Final   Salmonella species NOT DETECTED NOT DETECTED Final   Yersinia enterocolitica NOT DETECTED NOT DETECTED Final   Vibrio species NOT DETECTED NOT DETECTED Final   Vibrio cholerae NOT DETECTED NOT DETECTED Final   Enteroaggregative E coli (EAEC) NOT DETECTED NOT DETECTED Final   Enteropathogenic E coli (EPEC) NOT DETECTED NOT DETECTED Final   Enterotoxigenic E coli (ETEC) NOT DETECTED NOT DETECTED Final   Shiga like toxin producing E coli (STEC) NOT DETECTED NOT DETECTED Final   Shigella/Enteroinvasive E coli (EIEC) NOT DETECTED NOT DETECTED Final   Cryptosporidium NOT DETECTED NOT DETECTED Final   Cyclospora cayetanensis NOT DETECTED NOT DETECTED Final   Entamoeba histolytica NOT DETECTED NOT DETECTED Final   Giardia lamblia NOT DETECTED NOT DETECTED Final   Adenovirus F40/41 NOT DETECTED NOT DETECTED Final   Astrovirus NOT  DETECTED NOT DETECTED Final   Norovirus GI/GII NOT DETECTED NOT DETECTED Final   Rotavirus Quandra Fedorchak NOT DETECTED NOT DETECTED Final   Sapovirus (I, II, IV, and V) NOT DETECTED NOT DETECTED Final    Comment: Performed at [facility], [address], [place], [place] [address]  Urine Culture (for pregnant, neutropenic or urologic patients or patients with an indwelling urinary catheter)     Status: Abnormal (Preliminary result)   Collection Time: 09/03/22  4:23 PM   Specimen: Urine, Clean Catch  Result Value  Ref Range Status   Specimen Description   Final    URINE, CLEAN CATCH Performed at [facility], [address] [place], [place] [address]    Special Requests   Final    NONE Performed at [facility], [address], [place], [place] [address]    Culture (Adlean Hardeman)  Final    >=100,000 COLONIES/mL KLEBSIELLA PNEUMONIAE CULTURE REINCUBATED FOR BETTER GROWTH SUSCEPTIBILITIES TO FOLLOW Performed at [facility], [address] [place], [place] [address]    Report Status PENDING  Incomplete         Radiology Studies: CT ABDOMEN PELVIS WO CONTRAST  Result Date: 09/04/2022 CLINICAL DATA:  Acute abdominal pain EXAM: CT ABDOMEN AND PELVIS WITHOUT CONTRAST TECHNIQUE: Multidetector CT imaging of the abdomen and pelvis was performed following the standard protocol without IV contrast. RADIATION DOSE REDUCTION: This exam was performed according to the departmental dose-optimization program which includes automated exposure control, adjustment of the mA and/or kV according to patient size and/or use of iterative reconstruction technique. COMPARISON:  08/25/2022 FINDINGS: Lower chest: Lung bases are well aerated with small left pleural effusion. Minimal atelectatic changes are on the left noted. Decreased attenuation in the cardiac blood pool is noted suggestive of anemia. Hepatobiliary: Nodular changes of the liver noted consistent with underlying cirrhosis. The gallbladder has been surgically removed. Pancreas: Unremarkable. No pancreatic ductal dilatation or surrounding inflammatory changes. Spleen: Normal in size without focal abnormality. Adrenals/Urinary Tract: Adrenal glands are within normal limits. Right kidney again demonstrates nonobstructing upper pole stone stable from the prior exam. Left kidney shows no renal calculi. No obstructive changes are seen. The bladder is within normal limits. Stomach/Bowel: No obstructive or inflammatory changes of the colon  are seen. Postsurgical changes in the right colon are noted. No obstructive changes are seen. The small bowel and stomach are unremarkable. Vascular/Lymphatic: Aortic atherosclerosis. No enlarged abdominal or pelvic lymph nodes. Reproductive: Uterus demonstrates multiple large calcified uterine fibroids, stable in appearance from the prior exam. No adnexal mass is noted. Other: Free fluid is noted surrounding the liver and spleen consistent with the underlying cirrhosis. Changes of anasarca are noted stable from the prior study. Small fluid containing anterior abdominal wall hernia is again seen. Musculoskeletal: No acute bony abnormality noted. IMPRESSION: Changes consistent with cirrhosis with ascites and anasarca changes stable from the prior study. Findings suspicious for anemia. Nonobstructing right renal stone. Calcified uterine fibroids. Electronically Signed   By: Alcide Clever M.D.   On: 09/04/2022 20:59        Scheduled Meds:  calcitonin (salmon)  1 spray Alternating Nares Daily   carbamide peroxide  5 drop Right EAR BID   dicyclomine  10 mg Oral TID AC   diphenhydrAMINE  50 mg Oral Once   famotidine  20 mg Oral QAC breakfast   feeding supplement (KATE FARMS STANDARD 1.4)  325 mL Oral Daily   fluticasone  2 spray Each Nare  Daily   furosemide  60 mg Intravenous TID   ketotifen  1 drop Right Eye BID   lactulose  20 g Oral TID   levothyroxine  150 mcg Oral QAC breakfast   lidocaine  2 patch Transdermal Q24H   magic mouthwash  5 mL Oral QID   megestrol  40 mg Oral BID   midodrine  10 mg Oral TID WC   nystatin cream   Topical BID   pantoprazole  40 mg Oral BID AC   rifaximin  550 mg Oral BID   rOPINIRole  0.25 mg Oral QHS   sodium chloride flush  10-40 mL Intracatheter Q12H   spironolactone  25 mg Oral Daily   tamsulosin  0.4 mg Oral Daily   venlafaxine XR  75 mg Oral Q breakfast   zinc sulfate  220 mg Oral Daily   Continuous Infusions:  sodium chloride 250 mL (08/21/22 1447)    cefTRIAXone (ROCEPHIN)  IV 2 g (09/05/22 0944)   magnesium sulfate bolus IVPB 4 g (09/05/22 1352)     LOS: 25 days    Time spent: over 30 min    Lacretia Nicks, MD [facility]   To contact the attending provider between 7A-7P or the covering provider during after hours 7P-7A, please log into the web site [email or web address] and access using universal [place] password for that web site. If you do not have the password, please call the hospital operator.  09/05/2022, 1:53 PM

## 2022-09-05 NOTE — Plan of Care (Signed)

## 2022-09-05 NOTE — Progress Notes (Signed)
PT Cancellation Note  Patient Details Name: [name] MRN: [id number] DOB: 1955/07/10   Cancelled Treatment:    Reason Eval/Treat Not Completed: Pain limiting ability to participate. PT arrived in am. Pt in bed and son present. Pt reported back pain 10/10 and unable to participate with therapy at this time. PT communicated with nurse pt pain report and request for pain medication. PT to return later in the day if schedule allows. PT to continue to follow acutely.   [name], PT Acute Rehab   [name] 09/05/2022, 12:06 PM

## 2022-09-05 NOTE — Progress Notes (Signed)
Interventional Radiology Brief Note:  Patient brought to [facility] Radiology for possible kyphoplasty. Once on the fluoroscopy table attempt was made to identify bony landmarks. Unfortunately, due to body habitus no safe window for procedure was identified.  Patient was returned to her unit at [facility] No procedure performed.   Discussed with IR team, no other modalities could be used to safely accomplish procedure (I.e. CT, [place], etc..).  Patient not a candidate for vertebroplasty/kyphoplasty in IR at this time.   Loyce Dys, MS RD PA-C

## 2022-09-05 NOTE — Progress Notes (Deleted)
Patient Active Problem List   Diagnosis Date Noted   Cellulitis of left lower extremity 08/18/2022   Sepsis with acute renal failure, tubular necrosis, and septic shock (HCC) 08/18/2022   Physical deconditioning 08/17/2022   Goals of care, counseling/discussion 08/17/2022   Decompensated hepatic cirrhosis (HCC) 08/17/2022   Septic shock (HCC) 08/17/2022   Hypotension 08/11/2022   Folate deficiency 06/14/2022   Acute metabolic encephalopathy 06/13/2022   Systolic murmur 06/12/2022   Iron deficiency anemia 05/16/2022   History of eye surgery 05/01/2022   Obesity, Class III, BMI 40-49.9 (morbid obesity) (HCC) 05/01/2022   AKI (acute kidney injury) (HCC) 04/30/2022   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   GERD (gastroesophageal reflux disease) 07/25/2016   Hypothyroidism 07/25/2016    Patient's Medications  New Prescriptions   No medications on file  Previous Medications   ACETAMINOPHEN (TYLENOL) 325 MG TABLET    Take 650 mg by mouth every 6 (six) hours as needed for moderate pain or headache.   ATORVASTATIN (LIPITOR) 40 MG TABLET    Take 40 mg by mouth in the morning.   CICLOPIROX (PENLAC) 8 % SOLUTION    Apply 1 application  topically See admin instructions. Apply over all toenails and surrounding skin at bedtime. Apply daily over previous coat. After seven (7) days, may remove with alcohol and continue cycle.   COLCHICINE 0.6 MG TABLET    Take 0.6 mg by mouth daily as needed (gout).   CYANOCOBALAMIN (VITAMIN B12) 1000 MCG TABLET    Take 2,000 mcg by mouth every Friday.   EPINEPHRINE 0.3 MG/0.3 ML IJ SOAJ INJECTION    Inject 0.3 mg into the muscle as needed for anaphylaxis.   FAMOTIDINE (PEPCID) 20 MG TABLET    Take 20 mg by mouth daily before breakfast.   FOLIC ACID (FOLVITE) 1 MG TABLET    Take 1 tablet (1 mg total) by mouth daily.   FUROSEMIDE (LASIX) 20 MG TABLET    Take 20 mg by mouth in the morning.   FUROSEMIDE (LASIX) 40 MG TABLET    Take 1 tablet (40 mg total) by  mouth daily.   HYDROCODONE-ACETAMINOPHEN (NORCO) 10-325 MG TABLET    Take 1 tablet by mouth every 6 (six) hours as needed for moderate pain or severe pain.   HYDROXYZINE (ATARAX/VISTARIL) 25 MG TABLET    Take 25 mg by mouth 3 (three) times daily as needed for itching.   IRON-VITAMIN C PO    Take 1 tablet by mouth daily with breakfast.   LACTULOSE (CHRONULAC) 10 GM/15ML SOLUTION    Take 15 mLs (10 g total) by mouth 2 (two) times daily.   LEVOTHYROXINE (SYNTHROID) 150 MCG TABLET    Take 150 mcg by mouth daily before breakfast.   MAGNESIUM GLUCONATE (MAGONATE) 500 MG TABLET    Take 1,000 mg by mouth daily.   METHOCARBAMOL (ROBAXIN) 500 MG TABLET    Take 500 mg by mouth in the morning.   METOPROLOL SUCCINATE (TOPROL-XL) 25 MG 24 HR TABLET    Take 12.5 mg by mouth daily as needed (as directed for A-FIB).   MIDODRINE (PROAMATINE) 5 MG TABLET    Take 5 mg by mouth daily as needed (low blood pressure).   NALOXONE (NARCAN) NASAL SPRAY 4 MG/0.1 ML    Place 1 spray into the nose as needed (accidental overdose).   NYSTATIN CREAM (MYCOSTATIN)    Apply topically 2 (two) times daily.   NYSTATIN POWDER  Apply 1 Application topically 3 (three) times daily as needed (for irritation- affected areas).   PANTOPRAZOLE (PROTONIX) 40 MG TABLET    Take 1 tablet (40 mg total) by mouth daily.   POTASSIUM CHLORIDE (KLOR-CON) 10 MEQ TABLET    Take 20 mEq by mouth in the morning and at bedtime.   PREDNISONE (DELTASONE) 10 MG TABLET    Take 10-60 mg by mouth See admin instructions. Take 60 mg by mouth once a day with breakfast and decrease by 10 mg each day until finished   PROMETHAZINE (PHENERGAN) 12.5 MG TABLET    Take 12.5 mg by mouth every 6 (six) hours as needed for nausea or vomiting.   RIFAXIMIN (XIFAXAN) 550 MG TABS TABLET    Take 550 mg by mouth 2 (two) times daily.   SPIRONOLACTONE (ALDACTONE) 25 MG TABLET    Take 0.5 tablets (12.5 mg total) by mouth daily.   TRIAMCINOLONE CREAM (KENALOG) 0.5 %    Apply to affected  area of right breast twice daily as directed.   VENLAFAXINE XR (EFFEXOR-XR) 75 MG 24 HR CAPSULE    Take 75 mg by mouth daily with breakfast.   VITAMIN D, ERGOCALCIFEROL, (DRISDOL) 50000 UNITS CAPS    Take 50,000 Units by mouth every Wednesday.  Modified Medications   No medications on file  Discontinued Medications   No medications on file    Subjective: ***   Review of Systems: ROS  Past Medical History:  Diagnosis Date   Chronic diarrhea    Cirrhosis, 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   pt stated on admission history review   Heart murmur    Hypertension    Kidney stone    Obesity    Pulmonary embolism (HCC)    Short gut syndrome    Thyroid disease     Social History   Tobacco Use   Smoking status: Former    Current packs/day: 0.00    Types: Cigarettes    Quit date: 1987    Years since quitting: 37.6    Passive exposure: Past   Smokeless tobacco: Never  Vaping Use   Vaping status: Never Used  Substance Use Topics   Alcohol use: No   Drug use: No    No family history on file.  Allergies  Allergen Reactions   Ace Inhibitors Anaphylaxis and Swelling   Ivp Dye [Iodinated Contrast Media] Anaphylaxis   Desitin [Zinc Oxide] Other (See Comments)    Worsens rash   Chlorhexidine Rash and Other (See Comments)    WORSENS RASHES   Doxycycline Rash   Penicillins Rash   Zofran [Ondansetron] Rash    Health Maintenance  Topic Date Due   Hepatitis C Screening  Never done   DTaP/Tdap/Td (1 - Tdap) Never done   Zoster Vaccines- Shingrix (1 of 2) Never done   PAP SMEAR-Modifier  Never done   Colonoscopy  Never done   MAMMOGRAM  Never done   COVID-19 Vaccine (4 - 2023-24 season) 09/10/2021   Medicare Annual Wellness (AWV)  07/02/2022   INFLUENZA VACCINE  08/11/2022   HIV Screening  Completed   HPV VACCINES  Aged Out   Lung Cancer Screening  Discontinued    Objective:  There were no vitals filed for this visit. There is no height or weight on file to calculate  BMI.  Physical Exam  Lab Results Lab Results  Component Value Date   WBC 6.0 09/04/2022   HGB 8.7 (L) 09/04/2022   HCT 27.9 (L) 09/04/2022  MCV 97.6 09/04/2022   PLT 110 (L) 09/04/2022    Lab Results  Component Value Date   CREATININE 1.13 (H) 09/04/2022   BUN 15 09/04/2022   NA 142 09/04/2022   K 3.1 (L) 09/04/2022   CL 103 09/04/2022   CO2 28 09/04/2022    Lab Results  Component Value Date   ALT 16 09/04/2022   AST 21 09/04/2022   ALKPHOS 157 (H) 09/04/2022   BILITOT 2.3 (H) 09/04/2022    No results found for: "CHOL", "HDL", "LDLCALC", "LDLDIRECT", "TRIG", "CHOLHDL" No results found for: "LABRPR", "RPRTITER" No results found for: "HIV1RNAQUANT", "HIV1RNAVL", "CD4TABS"   Problem List Items Addressed This Visit   None  Assessment/Plan # 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

## 2022-09-05 NOTE — Plan of Care (Signed)
  Problem: Education: Goal: Knowledge of General Education information will improve Description: Including pain rating scale, medication(s)/side effects and non-pharmacologic comfort measures 09/05/2022 0331 by Conception Oms, RN Outcome: Progressing 09/05/2022 0331 by Conception Oms, RN Outcome: Progressing   Problem: Health Behavior/Discharge Planning: Goal: Ability to manage health-related needs will improve 09/05/2022 0331 by Conception Oms, RN Outcome: Progressing 09/05/2022 0331 by Conception Oms, RN Outcome: Progressing   Problem: Clinical Measurements: Goal: Ability to maintain clinical measurements within normal limits will improve 09/05/2022 0331 by Conception Oms, RN Outcome: Progressing 09/05/2022 0331 by Conception Oms, RN Outcome: Progressing Goal: Will remain free from infection 09/05/2022 0331 by Conception Oms, RN Outcome: Progressing 09/05/2022 0331 by Conception Oms, RN Outcome: Progressing Goal: Diagnostic test results will improve 09/05/2022 0331 by Conception Oms, RN Outcome: Progressing 09/05/2022 0331 by Conception Oms, RN Outcome: Progressing Goal: Respiratory complications will improve 09/05/2022 0331 by Conception Oms, RN Outcome: Progressing 09/05/2022 0331 by Conception Oms, RN Outcome: Progressing Goal: Cardiovascular complication will be avoided 09/05/2022 0331 by Conception Oms, RN Outcome: Progressing 09/05/2022 0331 by Conception Oms, RN Outcome: Progressing   Problem: Activity: Goal: Risk for activity intolerance will decrease 09/05/2022 0331 by Conception Oms, RN Outcome: Progressing 09/05/2022 0331 by Conception Oms, RN Outcome: Progressing   Problem: Nutrition: Goal: Adequate nutrition will be maintained 09/05/2022 0331 by Conception Oms, RN Outcome: Progressing 09/05/2022 0331 by Conception Oms, RN Outcome: Progressing   Problem: Coping: Goal: Level of anxiety will  decrease 09/05/2022 0331 by Conception Oms, RN Outcome: Progressing 09/05/2022 0331 by Conception Oms, RN Outcome: Progressing   Problem: Elimination: Goal: Will not experience complications related to bowel motility 09/05/2022 0331 by Conception Oms, RN Outcome: Progressing 09/05/2022 0331 by Conception Oms, RN Outcome: Progressing Goal: Will not experience complications related to urinary retention 09/05/2022 0331 by Conception Oms, RN Outcome: Progressing 09/05/2022 0331 by Conception Oms, RN Outcome: Progressing   Problem: Pain Managment: Goal: General experience of comfort will improve 09/05/2022 0331 by Conception Oms, RN Outcome: Progressing 09/05/2022 0331 by Conception Oms, RN Outcome: Progressing   Problem: Safety: Goal: Ability to remain free from injury will improve 09/05/2022 0331 by Conception Oms, RN Outcome: Progressing 09/05/2022 0331 by Conception Oms, RN Outcome: Progressing   Problem: Skin Integrity: Goal: Risk for impaired skin integrity will decrease 09/05/2022 0331 by Conception Oms, RN Outcome: Progressing 09/05/2022 0331 by Conception Oms, RN Outcome: Progressing

## 2022-09-06 DIAGNOSIS — A419 Sepsis, unspecified organism: Secondary | ICD-10-CM | POA: Diagnosis not present

## 2022-09-06 DIAGNOSIS — R6521 Severe sepsis with septic shock: Secondary | ICD-10-CM | POA: Diagnosis not present

## 2022-09-06 LAB — MAGNESIUM: Magnesium: 1.9 mg/dL (ref 1.7–2.4)

## 2022-09-06 LAB — CBC WITH DIFFERENTIAL/PLATELET
Abs Immature Granulocytes: 0.02 10*3/uL (ref 0.00–0.07)
Basophils Absolute: 0.1 10*3/uL (ref 0.0–0.1)
Basophils Relative: 1 %
Eosinophils Absolute: 0.4 10*3/uL (ref 0.0–0.5)
Eosinophils Relative: 6 %
HCT: 28.7 % — ABNORMAL LOW (ref 36.0–46.0)
Hemoglobin: 8.9 g/dL — ABNORMAL LOW (ref 12.0–15.0)
Immature Granulocytes: 0 %
Lymphocytes Relative: 24 %
Lymphs Abs: 1.7 10*3/uL (ref 0.7–4.0)
MCH: 29.8 pg (ref 26.0–34.0)
MCHC: 31 g/dL (ref 30.0–36.0)
MCV: 96 fL (ref 80.0–100.0)
Monocytes Absolute: 1.1 10*3/uL — ABNORMAL HIGH (ref 0.1–1.0)
Monocytes Relative: 16 %
Neutro Abs: 3.8 10*3/uL (ref 1.7–7.7)
Neutrophils Relative %: 53 %
Platelets: 105 10*3/uL — ABNORMAL LOW (ref 150–400)
RBC: 2.99 MIL/uL — ABNORMAL LOW (ref 3.87–5.11)
RDW: 18 % — ABNORMAL HIGH (ref 11.5–15.5)
WBC: 7.1 10*3/uL (ref 4.0–10.5)
nRBC: 0 % (ref 0.0–0.2)

## 2022-09-06 LAB — URINE CULTURE: Culture: >=100000 — AB

## 2022-09-06 LAB — PHOSPHORUS: Phosphorus: 3.5 mg/dL (ref 2.5–4.6)

## 2022-09-06 LAB — COMPREHENSIVE METABOLIC PANEL
ALT: 11 U/L (ref 0–44)
AST: 21 U/L (ref 15–41)
Albumin: 2.6 g/dL — ABNORMAL LOW (ref 3.5–5.0)
Alkaline Phosphatase: 169 U/L — ABNORMAL HIGH (ref 38–126)
Anion gap: 9 (ref 5–15)
BUN: 12 mg/dL (ref 8–23)
CO2: 27 mmol/L (ref 22–32)
Calcium: 7.9 mg/dL — ABNORMAL LOW (ref 8.9–10.3)
Chloride: 101 mmol/L (ref 98–111)
Creatinine, Ser: 1.12 mg/dL — ABNORMAL HIGH (ref 0.44–1.00)
GFR, Estimated: 55 mL/min — ABNORMAL LOW (ref >60)
Glucose, Bld: 102 mg/dL — ABNORMAL HIGH (ref 70–99)
Potassium: 3.6 mmol/L (ref 3.5–5.1)
Sodium: 137 mmol/L (ref 135–145)
Total Bilirubin: 2.8 mg/dL — ABNORMAL HIGH (ref 0.3–1.2)
Total Protein: 5.8 g/dL — ABNORMAL LOW (ref 6.5–8.1)

## 2022-09-06 LAB — PROTIME-INR
INR: 2.1 — ABNORMAL HIGH (ref 0.8–1.2)
Prothrombin Time: 23.6 s — ABNORMAL HIGH (ref 11.4–15.2)

## 2022-09-06 MED ORDER — POTASSIUM CHLORIDE CRYS ER 20 MEQ PO TBCR
40.0000 meq | EXTENDED_RELEASE_TABLET | ORAL | Status: AC
  Administered 2022-09-06 (×2): 40 meq via ORAL
  Filled 2022-09-06 (×2): qty 2

## 2022-09-06 NOTE — Progress Notes (Signed)
PROGRESS NOTE    [name]  NGE:[id number] DOB: [birth year] DOA: 08/11/2022 PCP: [name], MD  Chief Complaint  Patient presents with   Hypotension   Shortness of Breath    Brief Narrative:   67 year old female with PMH as below significant for NASH cirrhosis, HE, morbid obesity, chronic diarrhea, and MDR UTI (e. Coli and Klebsiella) who presents from home with complaints of left leg pain, fatigue, mild confusion, and recent N/V.    Patient lives with her son and cared for by her son.   She was admitted with septic shock due to cellulitis with Flavobacterium.  Initially treated in the ICU.  Treated with pressors.  Transition to midodrine.  ID was consulted on 8/4 for flava bacterium bacteremia.  Palliative care was consulted on 8/8 for goals of care currently DNR.  Transferred to hospitalist service on 8/9.  Transferred out of the stepdown on 8/10.  IV antibiotic until 8/17.  Started to have vaginal bleeding on 8/12, now resolved.  She had MRI of her spine sowing acute fractures of T3 and T5 vertebral bodies, moderate height loss at T3.  Mild to moderate height loss at T5.   IR consulted for kyphoplasty, but unable to perform due to body habitus.  Currently discharge pending improvement in volume status - significant dependent edema on exam to hips and breasts.  She has diffuse pain, partially related to vertebral fractures.  She has klebsiella pneumonia UTI which is being treated with abx.  Her breasts are erythematous and tender to palpation, ceftriaxone should cover possible cellulitis as well.   Assessment & Plan:   Principal Problem:   Septic shock (HCC) Active Problems:   AKI (acute kidney injury) (HCC)   Hypotension   Physical deconditioning   Goals of care, counseling/discussion   Decompensated hepatic cirrhosis (HCC)   Cellulitis of left lower extremity   Sepsis with acute renal failure, tubular necrosis, and septic shock (HCC)  Anasarca Aggressive  diuresis as noted below She is grossly overloaded with significant dependent edema in her hips/thighs as well as swelling in her breasts (lower extremity edema is less impressive, though present) Strict I/O, daily weights  Cellulitis Difficult as anasarca on exam, but bilateral breasts seemed to have more redness and tenderness on exam Follow with abx and diuresis (considered imaging given body habitus to eval for deep infection, but just had CT chest on 8/19)  Right Hip Pain CT abd/pelvis without notable changes  Klebsiella Pneumoniae UTI Ceftriaxone Follow final culture results   Decompensated NASH cirrhosis with ascites Coagulopathy, anemia, thrombocytopenia, anasarca Acute Hepatic Encephalopathy  H/o GIB  -Continue rifaximin,outpatient GI follow-up -lactulose scheduled -follow LFT's  -Patient appears to have anasarca with hypoalbuminemia, diuretics were held during admission due to AKI, hypotension -BNP 614.9, CT chest with bilateral pleural effusions, ascites, diffuse chest wall/body wall edema -Placed on Lasix 60 mg IV TID, continue spironolactone - titrate as tolerated  -she has abdominal discomfort, r/o SBP -> paracentesis ordered -> [place] with only mild ascites, no para done -continue lactulose (goal 2-3 BM daily)   Acute on chronic HFpEF, hypertension Moderate aortic stenosis -2D echo 06/2022> EF 60-65%, G1DD, mod AS -CT chest showed bilateral pleural effusions, patchy airspace disease in RUL and superior segment of LLL concerning for multifocal pneumonia (suspect this maybe due to volume overload with lack of respiratory symptoms or infectious symptoms at this time), diffuse chest wall, body wall edema -BNP 614.9, Lasix 60 mg IV TID, spironolactone 25 mg daily -  Strict I's and O's and daily weights  Septic shock (HCC) with AKI, multifactorial Flavobacterium bacteremia, LLE cellulitis and polymicrobial UTI Concern for cat scratch fever -Met criteria on admission with lactic  acidosis, encephalopathy, AKI, hypotension, tachycardia and tachypnea -Patient was admitted to ICU, was placed on pressors, fluids and IV albumin -Patient was treated with cefepime and Zithromax (for 5 days to cover cat scratch/Bartonella) -Blood cultures positive for flavobacterium. - ID was consulted, placed on meropenem for 2 weeks, completed on 08/26/2022.  Discussed with ID by my partner, does not need any further antibiotics. -CT abdomen 8/15 showed pancolitis -C. difficile PCR negative, GI pathogen panel negative   Acute on chronic back pain -Due to bacteremia, acute on chronic back pain, MRI T-spine, L-spine was obtained - motion artifact -> no evidence of discitis or osteomyelitis -> acute compression fractures of T3 and T5 vertebral bodies with marrow edema, moderate height loss at T3, mild to moderate height loss at T5 -planned for kyphoplasty 8/21, this was canceled (unable to identify safe window due to body habitus - per IR, not candidate for vertebroplasty/kyphoplasty at this time - note from 8/27 by Loyce Dys) -will add on calcitonin. APAP prn, lidocaine patch.  Oxycodone prn.   -pain remains poorly controlled   AKI with uremia, metabolic acidosis, - no obstruction or hydronephrosis on CT imaging, presumed due to septic shock  - Treated with IV pressors, IV fluids.  - Renal function has improved - follow closely as we aggressively diurese her    Coagulopathy. Postmenopausal vaginal bleeding. Macrocytic anemia with acute on chronic blood loss anemia - Due to cirrhosis her INR has remained elevated. - Patient was started on heparin in the ICU for DVT prophylaxis, subsequently started having postmenopausal vaginal bleeding. -Received Vitamin K x 1 on 8/12 x 5 days.  Heparin held, CT abdomen did not show any RPH -Pelvic ultrasound showed myomatous uterus with several fibroids - Dr Allena Katz discussed with hematology and GYN on-call, received tranexamic acid twice daily.  My  partner discussed with Dr Debroah Loop (GYN on-call), on 8/15 recommended Megace 40 mg p.o. twice daily for 2 weeks, will arrange outpatient follow-up at OB/GYN center for further evaluation outpatient. -Vaginal bleeding has resolved.   Physical deconditioning -PT evaluation   Hypothyroidism -Continue synthroid   Depression -Continue effexor    Headache. No focal deficits. Continue Tylenol.   Chronic right eye pain on movement towards medial side. Resolved. No red flag sign. Needs outpatient follow up   Ear Pain L TM normal appearing, R TM obscurred by cerumen (c/o R ear pain 8/26) Debrox flonase Outpatient follow-up with ENT recommended.   Right thyroid nodule 1.5 cm -Incidental seen on CT chest, recommend nonemergent thyroid ultrasound   Obesity Body mass index is 58.19 kg/m.      DVT prophylaxis: SCD Code Status: DNR Family Communication: son 8/21 Disposition:   Status is: Inpatient Remains inpatient appropriate because: continued need for inpatient care   Consultants:  CCM ID gyn  Procedures:  none  Antimicrobials:  Anti-infectives (From admission, onward)    Start     Dose/Rate Route Frequency Ordered Stop   09/05/22 1000  cefTRIAXone (ROCEPHIN) 2 g in sodium chloride 0.9 % 100 mL IVPB        2 g 200 mL/hr over 30 Minutes Intravenous Every 24 hours 09/05/22 0802     09/04/22 1415  ceFAZolin (ANCEF) IVPB 2g/100 mL premix  Status:  Discontinued        2 g 200  mL/hr over 30 Minutes Intravenous Every 8 hours 09/04/22 1324 09/05/22 0802   08/31/22 1030  vancomycin (VANCOREADY) IVPB 1500 mg/300 mL        1,500 mg 150 mL/hr over 120 Minutes Intravenous To Radiology 08/30/22 2001 09/01/22 1030   08/20/22 1200  meropenem (MERREM) 1 g in sodium chloride 0.9 % 100 mL IVPB  Status:  Discontinued        1 g 200 mL/hr over 30 Minutes Intravenous Every 8 hours 08/20/22 1101 08/26/22 1453   08/14/22 1800  azithromycin (ZITHROMAX) 250 mg in dextrose 5 % 125 mL IVPB   Status:  Discontinued        250 mg 127.5 mL/hr over 60 Minutes Intravenous Daily 08/14/22 1738 08/14/22 1752   08/14/22 1800  azithromycin (ZITHROMAX) 500 mg in sodium chloride 0.9 % 250 mL IVPB  Status:  Discontinued        500 mg 250 mL/hr over 60 Minutes Intravenous Daily 08/14/22 1752 08/15/22 1054   08/14/22 1500  meropenem (MERREM) 1 g in sodium chloride 0.9 % 100 mL IVPB  Status:  Discontinued        1 g 200 mL/hr over 30 Minutes Intravenous Every 12 hours 08/14/22 1354 08/20/22 1101   08/13/22 2100  ceFEPIme (MAXIPIME) 2 g in sodium chloride 0.9 % 100 mL IVPB  Status:  Discontinued        2 g 200 mL/hr over 30 Minutes Intravenous Every 24 hours 08/13/22 0436 08/13/22 1944   08/13/22 2100  Cefiderocol Sulfate Tosylate (FETROJA) 1 g in sodium chloride 0.9 % 100 mL IVPB  Status:  Discontinued        1 g 37.1 mL/hr over 3 Hours Intravenous Every 8 hours 08/13/22 1944 08/14/22 1353   08/13/22 0800  vancomycin (VANCOREADY) IVPB 1250 mg/250 mL  Status:  Discontinued        1,250 mg 166.7 mL/hr over 90 Minutes Intravenous Every 48 hours 08/11/22 1415 08/12/22 0717   08/12/22 2200  ceFEPIme (MAXIPIME) 2 g in sodium chloride 0.9 % 100 mL IVPB  Status:  Discontinued        2 g 200 mL/hr over 30 Minutes Intravenous Every 12 hours 08/12/22 1743 08/13/22 0436   08/12/22 1000  azithromycin (ZITHROMAX) tablet 250 mg  Status:  Discontinued       Placed in "Followed by" Linked Group   250 mg Oral Daily 08/11/22 1353 08/14/22 1738   08/11/22 2000  ceFEPIme (MAXIPIME) 2 g in sodium chloride 0.9 % 100 mL IVPB  Status:  Discontinued        2 g 200 mL/hr over 30 Minutes Intravenous Every 12 hours 08/11/22 1415 08/12/22 1743   08/11/22 1500  rifaximin (XIFAXAN) tablet 550 mg        550 mg Oral 2 times daily 08/11/22 1413     08/11/22 1445  azithromycin (ZITHROMAX) tablet 500 mg       Placed in "Followed by" Linked Group   500 mg Oral  Once 08/11/22 1353 08/11/22 1501   08/11/22 0745  vancomycin  (VANCOCIN) IVPB 1000 mg/200 mL premix  Status:  Discontinued        1,000 mg 200 mL/hr over 60 Minutes Intravenous  Once 08/11/22 0739 08/11/22 0740   08/11/22 0745  clindamycin (CLEOCIN) IVPB 600 mg        600 mg 100 mL/hr over 30 Minutes Intravenous  Once 08/11/22 0739 08/11/22 1004   08/11/22 0745  vancomycin (VANCOREADY) IVPB 2000 mg/400 mL  2,000 mg 200 mL/hr over 120 Minutes Intravenous STAT 08/11/22 0740 08/11/22 1030   08/11/22 0745  meropenem (MERREM) 1 g in sodium chloride 0.9 % 100 mL IVPB        1 g 200 mL/hr over 30 Minutes Intravenous STAT 08/11/22 0740 08/11/22 0914       Subjective: C/o throat pain   Objective: Vitals:   09/05/22 1206 09/05/22 2007 09/06/22 0544 09/06/22 0549  BP: 120/81 (!) 105/57    Pulse: 85 81    Resp: 19 19    Temp: 98.2 F (36.8 C) 98.3 F (36.8 C) 98.4 F (36.9 C)   TempSrc: Oral Oral Oral   SpO2: 99% 98%    Weight:    (!) 144.3 kg  Height:        Intake/Output Summary (Last 24 hours) at 09/06/2022 1516 Last data filed at 09/06/2022 1324 Gross per 24 hour  Intake --  Output 2350 ml  Net -2350 ml    Filed Weights   09/04/22 0416 09/05/22 0707 09/06/22 0549  Weight: (!) 165.7 kg (!) 148.7 kg (!) 144.3 kg    Examination:  General: No acute distress. Cardiovascular: RRR Breasts indurated with swelling and redness bilaterally, no appreciated fluctuance Lungs: unlabored Abdomen: Soft, nontender, nondistended  Neurological: Alert and oriented 3. Moves all extremities 4 . Cranial nerves II through XII grossly intact. Extremities: anasarca, dependent edema to hips   Data Reviewed: I have personally reviewed following labs and imaging studies  CBC: Recent Labs  Lab 09/02/22 0501 09/03/22 1520 09/04/22 2024 09/05/22 1317 09/06/22 0701  WBC 5.9 4.9 6.0 7.1 7.1  NEUTROABS 2.8 2.1 3.2 5.5 3.8  HGB 8.6* 9.1* 8.7* 8.8* 8.9*  HCT 28.4* 31.3* 27.9* 27.3* 28.7*  MCV 97.3 102.3* 97.6 94.1 96.0  PLT 93* 96* 110* 135*  105*    Basic Metabolic Panel: Recent Labs  Lab 09/02/22 0501 09/03/22 1520 09/04/22 2024 09/05/22 1317 09/06/22 0701  NA 140 139 142 142 137  K 3.5 3.3* 3.1* 3.5 3.6  CL 108 109 103 101 101  CO2 25 24 28 26 27   GLUCOSE 102* 99 103* 106* 102*  BUN 25* 18 15 13 12   CREATININE 1.14* 1.01* 1.13* 1.04* 1.12*  CALCIUM 8.0* 7.8* 8.1* 8.0* 7.9*  MG 2.0 1.7 1.4* 1.3* 1.9  PHOS 3.4 3.1 3.0 3.2 3.5    GFR: Estimated Creatinine Clearance: 70.3 mL/min (Gunther Zawadzki) (by C-G formula based on SCr of 1.12 mg/dL (H)).  Liver Function Tests: Recent Labs  Lab 09/02/22 0501 09/03/22 1520 09/04/22 2024 09/05/22 1317 09/06/22 0701  AST 21 22 21 24 21   ALT 18 18 16 13 11   ALKPHOS 160* 155* 157* 152* 169*  BILITOT 2.1* 2.9* 2.3* 2.4* 2.8*  PROT 5.4* 5.3* 5.3* 5.5* 5.8*  ALBUMIN 2.5* 2.4* 2.4* 2.5* 2.6*    CBG: No results for input(s): "GLUCAP" in the last 168 hours.   Recent Results (from the past 240 hour(s))  Urine Culture (for pregnant, neutropenic or urologic patients or patients with an indwelling urinary catheter)     Status: Abnormal   Collection Time: 09/03/22  4:23 PM   Specimen: Urine, Clean Catch  Result Value Ref Range Status   Specimen Description   Final    URINE, CLEAN CATCH Performed at [facility], [address], [place], [place] [address]    Special Requests   Final    NONE Performed at [facility], [address] [place], [place] [address]  Culture >=100,000 COLONIES/mL KLEBSIELLA PNEUMONIAE (Jaleena Viviani)  Final   Report Status 09/06/2022 FINAL  Final   Organism ID, Bacteria KLEBSIELLA PNEUMONIAE (Alonzo Loving)  Final      Susceptibility   Klebsiella pneumoniae - MIC*    AMPICILLIN >=32 RESISTANT Resistant     CEFAZOLIN 8 SENSITIVE Sensitive     CEFEPIME <=0.12 SENSITIVE Sensitive     CEFTRIAXONE 1 SENSITIVE Sensitive     CIPROFLOXACIN <=0.25 SENSITIVE Sensitive     GENTAMICIN <=1 SENSITIVE Sensitive     IMIPENEM <=0.25 SENSITIVE Sensitive      NITROFURANTOIN 128 RESISTANT Resistant     TRIMETH/SULFA <=20 SENSITIVE Sensitive     AMPICILLIN/SULBACTAM >=32 RESISTANT Resistant     PIP/TAZO >=128 RESISTANT Resistant     * >=100,000 COLONIES/mL KLEBSIELLA PNEUMONIAE         Radiology Studies: CT ABDOMEN PELVIS WO CONTRAST  Result Date: 09/04/2022 CLINICAL DATA:  Acute abdominal pain EXAM: CT ABDOMEN AND PELVIS WITHOUT CONTRAST TECHNIQUE: Multidetector CT imaging of the abdomen and pelvis was performed following the standard protocol without IV contrast. RADIATION DOSE REDUCTION: This exam was performed according to the departmental dose-optimization program which includes automated exposure control, adjustment of the mA and/or kV according to patient size and/or use of iterative reconstruction technique. COMPARISON:  08/25/2022 FINDINGS: Lower chest: Lung bases are well aerated with small left pleural effusion. Minimal atelectatic changes are on the left noted. Decreased attenuation in the cardiac blood pool is noted suggestive of anemia. Hepatobiliary: Nodular changes of the liver noted consistent with underlying cirrhosis. The gallbladder has been surgically removed. Pancreas: Unremarkable. No pancreatic ductal dilatation or surrounding inflammatory changes. Spleen: Normal in size without focal abnormality. Adrenals/Urinary Tract: Adrenal glands are within normal limits. Right kidney again demonstrates nonobstructing upper pole stone stable from the prior exam. Left kidney shows no renal calculi. No obstructive changes are seen. The bladder is within normal limits. Stomach/Bowel: No obstructive or inflammatory changes of the colon are seen. Postsurgical changes in the right colon are noted. No obstructive changes are seen. The small bowel and stomach are unremarkable. Vascular/Lymphatic: Aortic atherosclerosis. No enlarged abdominal or pelvic lymph nodes. Reproductive: Uterus demonstrates multiple large calcified uterine fibroids, stable in  appearance from the prior exam. No adnexal mass is noted. Other: Free fluid is noted surrounding the liver and spleen consistent with the underlying cirrhosis. Changes of anasarca are noted stable from the prior study. Small fluid containing anterior abdominal wall hernia is again seen. Musculoskeletal: No acute bony abnormality noted. IMPRESSION: Changes consistent with cirrhosis with ascites and anasarca changes stable from the prior study. Findings suspicious for anemia. Nonobstructing right renal stone. Calcified uterine fibroids. Electronically Signed   By: Alcide Clever M.D.   On: 09/04/2022 20:59        Scheduled Meds:  calcitonin (salmon)  1 spray Alternating Nares Daily   carbamide peroxide  5 drop Right EAR BID   dicyclomine  10 mg Oral TID AC   diphenhydrAMINE  50 mg Oral Once   famotidine  20 mg Oral QAC breakfast   feeding supplement ([employer] STANDARD 1.4)  325 mL Oral Daily   fluticasone  2 spray Each Nare Daily   furosemide  60 mg Intravenous TID   ketotifen  1 drop Right Eye BID   lactulose  20 g Oral TID   levothyroxine  150 mcg Oral QAC breakfast   lidocaine  2 patch Transdermal Q24H   magic mouthwash  5 mL Oral QID   megestrol  40 mg Oral BID   midodrine  10 mg Oral TID WC   nystatin cream   Topical BID   pantoprazole  40 mg Oral BID AC   rifaximin  550 mg Oral BID   rOPINIRole  0.25 mg Oral QHS   sodium chloride flush  10-40 mL Intracatheter Q12H   spironolactone  25 mg Oral Daily   tamsulosin  0.4 mg Oral Daily   venlafaxine XR  75 mg Oral Q breakfast   zinc sulfate  220 mg Oral Daily   Continuous Infusions:  sodium chloride 250 mL (08/21/22 1447)   cefTRIAXone (ROCEPHIN)  IV 2 g (09/06/22 1053)     LOS: 26 days    Time spent: over 30 min    Lacretia Nicks, MD [facility]   To contact the attending provider between 7A-7P or the covering provider during after hours 7P-7A, please log into the web site [email or web address] and access using  universal [place] password for that web site. If you do not have the password, please call the hospital operator.  09/06/2022, 3:16 PM

## 2022-09-06 NOTE — Progress Notes (Signed)
Nutrition Follow-up  DOCUMENTATION CODES:   Morbid obesity  INTERVENTION:   -[employer] 1.4 PO daily, each provides 455 kcals and 20g protein  -D/c Zinc sulfate, has received since 8/8. Will d/c to prevent copper deficiency.  NUTRITION DIAGNOSIS:   Inadequate oral intake related to acute illness, lethargy/confusion as evidenced by energy intake < or equal to 50% for > or equal to 5 days.  Ongoing.  GOAL:   Patient will meet greater than or equal to 90% of their needs  Progressing.  MONITOR:   PO intake, Supplement acceptance, Labs, Weight trends, I & O's  REASON FOR ASSESSMENT:   Consult Enteral/tube feeding initiation and management  ASSESSMENT:   67 year old female with PMH NASH cirrhosis, HE, morbid obesity, and MDR UTI (e. 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 Found to have multifactorial shock.  8/1: Admit 8/5: NGT placed 8/7: SLP eval-Reg diet; NGT removed 8/21: diet changed to low sodium given fluid accumulation  Patient consuming 50-100% of meals, did have food brought from outside hospital this weekend. Will continue [employer] as pt does accept some days.   Admission weight: 268 lbs Current weight: 318 lbs I/Os:  -8.9L since 8/13 -on IV Lasix, aldactone  Medications: Bentyl, Pepcid, Lasix, Lactulose, Magic Mouthwash, Megace, KLOR-CON, Aldactone, Zinc sulfate  Labs reviewed.  Diet Order:   Diet Order             Diet 2 gram sodium Room service appropriate? Yes; Fluid consistency: Thin  Diet effective now                   EDUCATION NEEDS:   Education needs have been addressed  Skin:  Skin Assessment: Reviewed RN Assessment Skin Integrity Issues:: Other (Comment) Other: Skin tear right buttocks  Last BM:  8/27  Height:   Ht Readings from Last 1 Encounters:  08/11/22  5\' 2"  (1.575 m)    Weight:   Wt Readings from Last 1 Encounters:  09/06/22 (!) 144.3 kg    BMI:  Body mass index is 58.19 kg/m.  Estimated Nutritional Needs:   Kcal:  1750-2000 kcals  Protein:  75-100 grams  Fluid:  >/= 1.8L   Tilda Franco, MS, RD, LDN Inpatient Clinical Dietitian Contact information available via Amion

## 2022-09-06 NOTE — Progress Notes (Signed)
Physical Therapy Treatment Patient Details Name: [name] MRN: [id number] DOB: 1955-10-09 Today's Date: 09/06/2022   History of Present Illness Patient is a 67 yr old woman who presented with with fatigue and shortness of breath.  Currently being treated for septic shock due to cellulitis & UTI. PMH: NASH cirrhosis, HTN, PE, a fib, HTN, HLD    PT Comments   Pt admitted with above diagnosis.  Pt currently with functional limitations due to the deficits listed below (see PT Problem List). Pt in bed when PT arrived this am. Pt stating she is feeling a little better today. Pt agreeable to getting OOB. Pt required increased time and min A for supine to sit, CGA for sit to stand from EOB x 3 and CGA to complete SPT at RW level with cues. Pt indicated she did not feel like she would be able to amb with therapy today. Pt left seated in recliner and all needs in place. Pt will benefit from acute skilled PT to increase their independence and safety with mobility to allow discharge.      If plan is discharge home, recommend the following: Assistance with cooking/housework;Help with stairs or ramp for entrance;A little help with walking and/or transfers;A little help with bathing/dressing/bathroom   Can travel by private vehicle     Yes  Equipment Recommendations  None recommended by PT    Recommendations for Other Services       Precautions / Restrictions Precautions Precautions: Fall Precaution Comments: T3 T5 comp Fx Restrictions Weight Bearing Restrictions: No Other Position/Activity Restrictions: contact precautions     Mobility  Bed Mobility Overal bed mobility: Needs Assistance Bed Mobility: Supine to Sit     Supine to sit: Min assist, HOB elevated     General bed mobility comments: cues and increased time    Transfers Overall transfer level: Needs assistance Equipment used: Rolling walker (2 wheels) Transfers: Sit to/from Stand, Bed to chair/wheelchair/BSC Sit to  Stand: Contact guard assist           General transfer comment: sit to stand from EOB x 3 and pt indicated she did not think she would be able to amb today. min cues for safety and proper UE placement for sit to stand and SPT to recliner    Ambulation/Gait               General Gait Details: NT pt did not think she could walk today   Stairs             Wheelchair Mobility     Tilt Bed    Modified Rankin (Stroke Patients Only)       Balance Overall balance assessment: Needs assistance Sitting-balance support: Feet supported Sitting balance-Leahy Scale: Good     Standing balance support: Bilateral upper extremity supported, During functional activity Standing balance-Leahy Scale: Poor Standing balance comment: RW and CGA for static and dynamic                            Cognition Arousal: Alert Behavior During Therapy: WFL for tasks assessed/performed Overall Cognitive Status: Within Functional Limits for tasks assessed                                          Exercises      General Comments  Pertinent Vitals/Pain Pain Assessment Pain Assessment: Faces Faces Pain Scale: Hurts little more Breathing: normal Negative Vocalization: none Facial Expression: smiling or inexpressive Body Language: relaxed Consolability: no need to console PAINAD Score: 0 Pain Location: back Pain Descriptors / Indicators: Sore, Constant Pain Intervention(s): Limited activity within patient's tolerance, Monitored during session    Home Living Family/patient expects to be discharged to:: Private residence Living Arrangements: Children Available Help at Discharge: Family;Available 24 hours/day Type of Home: House Home Access: Ramped entrance       Home Layout: One level Home Equipment: Rollator (4 wheels);Rolling Walker (2 wheels);BSC/3in1;Other (comment);Hospital bed;Wheelchair - manual Water quality scientist) Additional Comments: Pt lives  with [name] - very supportive, provides assist as needed, takes pt out in community, provides mental challenges to pt to keep her sharp and monitor status - per PT note    Prior Function            PT Goals (current goals can now be found in the care plan section) Acute Rehab PT Goals Patient Stated Goal: To Walk; To decrease pain PT Goal Formulation: With patient/family Time For Goal Achievement: 09/15/22 Potential to Achieve Goals: Good Progress towards PT goals: Progressing toward goals    Frequency    Min 1X/week      PT Plan      Co-evaluation              AM-PAC PT "6 Clicks" Mobility   Outcome Measure  Help needed turning from your back to your side while in a flat bed without using bedrails?: A Little Help needed moving from lying on your back to sitting on the side of a flat bed without using bedrails?: A Lot Help needed moving to and from a bed to a chair (including a wheelchair)?: A Little Help needed standing up from a chair using your arms (e.g., wheelchair or bedside chair)?: A Little Help needed to walk in hospital room?: A Little Help needed climbing 3-5 steps with a railing? : A Lot 6 Click Score: 16    End of Session Equipment Utilized During Treatment: Gait belt Activity Tolerance: Patient limited by pain;Patient limited by fatigue Patient left: in chair;with call bell/phone within reach Nurse Communication: Mobility status PT Visit Diagnosis: Muscle weakness (generalized) (M62.81)     Time: 1610-9604 PT Time Calculation (min) (ACUTE ONLY): 26 min  Charges:    $Therapeutic Activity: 23-37 mins PT General Charges $$ ACUTE PT VISIT: 1 Visit                     [name], PT Acute Rehab    [name] 09/06/2022, 11:56 AM

## 2022-09-07 DIAGNOSIS — L03116 Cellulitis of left lower limb: Secondary | ICD-10-CM | POA: Diagnosis not present

## 2022-09-07 DIAGNOSIS — R6521 Severe sepsis with septic shock: Secondary | ICD-10-CM | POA: Diagnosis not present

## 2022-09-07 DIAGNOSIS — N179 Acute kidney failure, unspecified: Secondary | ICD-10-CM | POA: Diagnosis not present

## 2022-09-07 DIAGNOSIS — A419 Sepsis, unspecified organism: Secondary | ICD-10-CM | POA: Diagnosis not present

## 2022-09-07 LAB — SARS CORONAVIRUS 2 BY RT PCR: SARS Coronavirus 2 by RT PCR: NEGATIVE

## 2022-09-07 LAB — PROTIME-INR
INR: 2.1 — ABNORMAL HIGH (ref 0.8–1.2)
Prothrombin Time: 23.8 s — ABNORMAL HIGH (ref 11.4–15.2)

## 2022-09-07 MED ORDER — FUROSEMIDE 10 MG/ML IJ SOLN
60.0000 mg | Freq: Three times a day (TID) | INTRAMUSCULAR | Status: DC
  Administered 2022-09-08: 60 mg via INTRAVENOUS
  Filled 2022-09-07: qty 6

## 2022-09-07 NOTE — Progress Notes (Signed)
OT Cancellation Note  Patient Details Name: Kelsa Portwood MRN: [id number] DOB: 1955-11-06   Cancelled Treatment:    Reason Eval/Treat Not Completed: Other (comment). Pt asked for therapy to check back this afternoon.  Reuben Likes, OTR/L 09/07/2022, 10:03 AM

## 2022-09-07 NOTE — Progress Notes (Signed)
Occupational Therapy Treatment Patient Details Name: [name] MRN: [id number] DOB: [birth year] Today's Date: 09/07/2022   History of present illness Patient is a 67 yr old woman who presented with with fatigue and shortness of breath.  Currently being treated for septic shock due to cellulitis & UTI. Pt is currently pending discharge with further monitoring of volume status, due to significant dependent edema to hips and breasts.  PMH: NASH cirrhosis, HTN, PE, a fib, HTN, HLD   OT comments  Pt was seen for progression of out of bed activity and ADL participation. She required assist for sock management seated EOB. 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 Once assisted into sitting at chair level, she performed upper body grooming tasks with set-up assist. Continue OT plan of care.       If plan is discharge home, recommend the following:  Help with stairs or ramp for entrance;Assist for transportation;A little help with bathing/dressing/bathroom;A little help with walking and/or transfers;Assistance with cooking/housework;Direct supervision/assist for medications management   Equipment Recommendations  None recommended by OT    Recommendations for Other Services      Precautions / Restrictions Precautions Precautions: Fall Precaution Comments: T3 T5 comp Fx Restrictions Weight Bearing Restrictions: No Other Position/Activity Restrictions: contact precautions       Mobility Bed Mobility Overal bed mobility: Needs Assistance Bed Mobility: Supine to Sit Rolling: Min assist   Supine to sit: Min assist, HOB elevated, Used rails          Transfers Overall transfer level: Needs assistance Equipment used: Rolling walker (2 wheels) Transfers: Sit to/from Stand Sit to Stand: Contact guard assist                     ADL either performed or assessed with clinical  judgement   ADL Overall ADL's : Needs assistance/impaired Eating/Feeding: Independent;Sitting Eating/Feeding Details (indicate cue type and reason): based on clinical judgement Grooming: Set up;Sitting Grooming Details (indicate cue type and reason): She performed face washing, hand washing, hair brushing, and teeth brushing in sitting at chair level.         Upper Body Dressing : Set up;Sitting   Lower Body Dressing: Moderate assistance Lower Body Dressing Details (indicate cue type and reason): She required assist for donning her socks seated EOB. Toilet Transfer: Minimal assistance;Stand-pivot;Ambulation;Rolling walker (2 wheels) Toilet Transfer Details (indicate cue type and reason): at bathroom level, based on clinical judgement                 Cognition Arousal: Alert   Overall Cognitive Status: Within Functional Limits for tasks assessed                      Pertinent Vitals/ Pain       Pain Assessment Pain Assessment: Faces Pain Score: 4  Pain Location: L knee with progressive activity, due to reported baseline "bone on bone" arthritis Pain Intervention(s): Limited activity within patient's tolerance, Monitored during session         Frequency  Min 1X/week        Progress Toward Goals  OT Goals(current goals can now be found in the care plan section)  Progress towards OT goals: Progressing toward goals  Acute Rehab OT Goals Patient Stated Goal: to return home soon OT Goal Formulation: With patient Time For Goal Achievement: 09/18/22 Potential to Achieve Goals: Good  Plan         AM-PAC OT "6 Clicks" Daily Activity     Outcome Measure   Help from another person eating meals?: None Help from another person taking care of personal grooming?: A Little Help from another person toileting, which includes using toliet, bedpan, or urinal?: A Little Help from another person bathing (including washing, rinsing, drying)?: A Little Help from  another person to put on and taking off regular upper body clothing?: None Help from another person to put on and taking off regular lower body clothing?: A Lot 6 Click Score: 19    End of Session Equipment Utilized During Treatment: Gait belt;Rolling walker (2 wheels)  OT Visit Diagnosis: Muscle weakness (generalized) (M62.81);Unsteadiness on feet (R26.81);Pain Pain - Right/Left: Left Pain - part of body: Knee   Activity Tolerance Patient tolerated treatment well   Patient Left in chair;with call bell/phone within reach;with family/visitor present   Nurse Communication Mobility status        Time: 1310-1331 OT Time Calculation (min): 21 min  Charges: OT General Charges $OT Visit: 1 Visit OT Treatments $Self Care/Home Management : 8-22 mins     Reuben Likes, OTR/L 09/07/2022, 2:04 PM

## 2022-09-07 NOTE — Plan of Care (Signed)
  Problem: Education: Goal: Knowledge of General Education information will improve Description: Including pain rating scale, medication(s)/side effects and non-pharmacologic comfort measures Outcome: Progressing   Problem: Health Behavior/Discharge Planning: Goal: Ability to manage health-related needs will improve Outcome: Progressing   Problem: Clinical Measurements: Goal: Ability to maintain clinical measurements within normal limits will improve Outcome: Progressing Goal: Will remain free from infection Outcome: Progressing Goal: Diagnostic test results will improve Outcome: Progressing Goal: Respiratory complications will improve Outcome: Progressing Goal: Cardiovascular complication will be avoided Outcome: Progressing  Pt A/Ox3 forgetful at times, on RA. PRN pain medication administered at beginning of shift. PW in place, high output.

## 2022-09-07 NOTE — Progress Notes (Signed)
Mobility Specialist - Progress Note   09/07/22 1329  Mobility  Activity Ambulated with assistance in hallway  Level of Assistance Contact guard assist, steadying assist  Assistive Device Front wheel walker  Distance Ambulated (ft) 130 ft  Range of Motion/Exercises Active  Activity Response Tolerated well  Mobility Referral Yes  $Mobility charge 1 Mobility  Mobility Specialist Start Time (ACUTE ONLY) 1310  Mobility Specialist Stop Time (ACUTE ONLY) 1323  Mobility Specialist Time Calculation (min) (ACUTE ONLY) 13 min   Pt received in bed and agreed to mobility. Had no issues throughout session. Returned to chair and was left with all needs met, OT in room for session.   Marilynne Halsted Mobility Specialist

## 2022-09-07 NOTE — Progress Notes (Signed)
Progress Note    [name]   HQI:[id number]  DOB: April 13, 1955  DOA: 08/11/2022     27 PCP: [name], MD  Initial CC: leg pain, fatigue   Hospital Course: 67 year old female with PMH HTN, NASH cirrhosis, HE, morbid obesity, chronic diarrhea, and MDR UTI (e. Coli and Klebsiella) who presented from home with complaints of left leg pain, fatigue, mild confusion, and recent N/V.    Patient lives with her son and cared for by her son.    She was admitted with septic shock due to cellulitis with Flavobacterium bacteremia as well. Initially treated in the ICU.  Treated with pressors.  Transition to midodrine. ID was consulted on 8/4 for flavobacterium bacteremia. Palliative care was consulted on 8/8 for goals of care currently DNR. Started to have vaginal bleeding on 8/12, now resolved. She had MRI of her spine showing acute fractures of T3 and T5 vertebral bodies, moderate height loss at T3.  Mild to moderate height loss at T5. IR consulted for kyphoplasty, but unable to perform due to body habitus.   Currently discharge pending improvement in volume status - significant dependent edema on exam to hips and breasts.  She has diffuse pain, partially related to vertebral fractures.  She has klebsiella pneumonia UTI which is being treated with abx.  Her breasts are erythematous and tender to palpation, ceftriaxone should cover possible cellulitis as well.     Interval History:  No events overnight. Updated son in hallway today.   Assessment and Plan:  Anasarca - improving  Aggressive diuresis as noted below She is grossly overloaded with significant dependent edema in her hips/thighs as well as swelling in her breasts (lower extremity edema is less impressive, though present) Strict I/O, daily weights   Cellulitis Difficult as anasarca on exam, but bilateral breasts seemed to have more redness and tenderness on exam Follow with abx and diuresis (considered imaging given body  habitus to eval for deep infection, but just had CT chest on 8/19)  Right Hip Pain CT abd/pelvis without notable changes   Klebsiella Pneumoniae UTI Ceftriaxone   Decompensated NASH cirrhosis with ascites Coagulopathy, anemia, thrombocytopenia, anasarca Acute Hepatic Encephalopathy  H/o GIB  -Continue rifaximin,outpatient GI follow-up -lactulose scheduled -follow LFT's  -Patient appears to have anasarca with hypoalbuminemia, diuretics were held during admission due to AKI, hypotension -BNP 614.9, CT chest with bilateral pleural effusions, ascites, diffuse chest wall/body wall edema -Placed on Lasix 60 mg IV TID, continue spironolactone - titrate as tolerated  -she has abdominal discomfort, r/o SBP -> paracentesis ordered -> [place] with only mild ascites, no para done -continue lactulose (goal 2-3 BM daily)    Acute on chronic HFpEF, hypertension Moderate aortic stenosis -2D echo 06/2022> EF 60-65%, G1DD, mod AS -CT chest showed bilateral pleural effusions, patchy airspace disease in RUL and superior segment of LLL concerning for multifocal pneumonia (suspect this maybe due to volume overload with lack of respiratory symptoms or infectious symptoms at this time), diffuse chest wall, body wall edema -BNP 614.9, Lasix 60 mg IV TID, spironolactone 25 mg daily -Strict I's and O's and daily weights   Septic shock (HCC) with AKI, multifactorial Flavobacterium bacteremia, LLE cellulitis and polymicrobial UTI Concern for cat scratch fever -Met criteria on admission with lactic acidosis, encephalopathy, AKI, hypotension, tachycardia and tachypnea -Patient was admitted to ICU, was placed on pressors, fluids and IV albumin -Patient was treated with cefepime and Zithromax (for 5 days to cover cat scratch/Bartonella) -Blood cultures positive  for flavobacterium. - ID was consulted, placed on meropenem for 2 weeks, completed on 08/26/2022.  Discussed with ID by my partner, does not need any further  antibiotics. -CT abdomen 8/15 showed pancolitis -C. difficile PCR negative, GI pathogen panel negative   Acute on chronic back pain -Due to bacteremia, acute on chronic back pain, MRI T-spine, L-spine was obtained - motion artifact -> no evidence of discitis or osteomyelitis -> acute compression fractures of T3 and T5 vertebral bodies with marrow edema, moderate height loss at T3, mild to moderate height loss at T5 -planned for kyphoplasty 8/21, this was canceled (unable to identify safe window due to body habitus - per IR, not candidate for vertebroplasty/kyphoplasty at this time - note from 8/27 by Loyce Dys) -will add on calcitonin. APAP prn, lidocaine patch.  Oxycodone prn.   -pain remains poorly controlled   AKI with uremia, metabolic acidosis, - no obstruction or hydronephrosis on CT imaging, presumed due to septic shock  - Treated with IV pressors, IV fluids.  - Renal function has improved - follow closely as we aggressively diurese her    Coagulopathy. Postmenopausal vaginal bleeding. Macrocytic anemia with acute on chronic blood loss anemia - Due to cirrhosis her INR has remained elevated. - Patient was started on heparin in the ICU for DVT prophylaxis, subsequently started having postmenopausal vaginal bleeding. -Received Vitamin K x 1 on 8/12 x 5 days.  Heparin held, CT abdomen did not show any RPH -Pelvic ultrasound showed myomatous uterus with several fibroids - Dr Allena Katz discussed with hematology and GYN on-call, received tranexamic acid twice daily.  My partner discussed with Dr Debroah Loop (GYN on-call), on 8/15 recommended Megace 40 mg p.o. twice daily for 2 weeks, will arrange outpatient follow-up at OB/GYN center for further evaluation outpatient. -Vaginal bleeding has resolved.   Physical deconditioning -PT evaluation   Hypothyroidism -Continue synthroid   Depression -Continue effexor    Headache. No focal deficits. Continue Tylenol.   Chronic right eye pain  on movement towards medial side. Resolved. No red flag sign. Needs outpatient follow up   Ear Pain L TM normal appearing, R TM obscurred by cerumen (c/o R ear pain 8/26) Debrox flonase Outpatient follow-up with ENT recommended.   Right thyroid nodule 1.5 cm -Incidental seen on CT chest, recommend nonemergent thyroid ultrasound   Obesity Body mass index is 58.19 kg/m.     Old records reviewed in assessment of this patient   DVT prophylaxis:  Place and maintain sequential compression device Start: 08/22/22 1149 SCDs Start: 08/11/22 1108   Code Status:   Code Status: DNR  Mobility Assessment (Last 72 Hours)     Mobility Assessment     Row Name 09/07/22 1310 09/07/22 0833 09/06/22 1947 09/06/22 1132 09/06/22 0900   Does patient have an order for bedrest or is patient medically unstable -- No - Continue assessment No - Continue assessment -- No - Continue assessment   What is the highest level of mobility based on the progressive mobility assessment? Level 5 (Walks with assist in room/hall) - Balance while stepping forward/back and can walk in room with assist - Complete Level 3 (Stands with assist) - Balance while standing  and cannot march in place Level 3 (Stands with assist) - Balance while standing  and cannot march in place Level 3 (Stands with assist) - Balance while standing  and cannot march in place Level 3 (Stands with assist) - Balance while standing  and cannot march in place   Is  the above level different from baseline mobility prior to current illness? -- Yes - Recommend PT order Yes - Recommend PT order -- Yes - Recommend PT order    Row Name 09/05/22 2034 09/05/22 0747 09/05/22 0630 09/04/22 2015     Does patient have an order for bedrest or is patient medically unstable No - Continue assessment No - Continue assessment No - Continue assessment No - Continue assessment    What is the highest level of mobility based on the progressive mobility assessment? Level 3  (Stands with assist) - Balance while standing  and cannot march in place Level 4 (Walks with assist in room) - Balance while marching in place and cannot step forward and back - Complete Level 4 (Walks with assist in room) - Balance while marching in place and cannot step forward and back - Complete Level 4 (Walks with assist in room) - Balance while marching in place and cannot step forward and back - Complete    Is the above level different from baseline mobility prior to current illness? -- Yes - Recommend PT order Yes - Recommend PT order Yes - Recommend PT order             Barriers to discharge: none Disposition Plan:  Home Status is: Inpt  Objective: Blood pressure (!) 105/57, pulse 81, temperature 98.4 F (36.9 C), temperature source Oral, resp. rate 19, height 5\' 2"  (1.575 m), weight (!) 144.3 kg, SpO2 98%.  Examination:  Physical Exam Constitutional:      Appearance: She is well-developed.  HENT:     Head: Normocephalic and atraumatic.     Mouth/Throat:     Mouth: Mucous membranes are moist.  Eyes:     Extraocular Movements: Extraocular movements intact.  Cardiovascular:     Rate and Rhythm: Normal rate and regular rhythm.  Pulmonary:     Effort: Pulmonary effort is normal. No respiratory distress.     Breath sounds: Normal breath sounds. No wheezing.  Abdominal:     General: Bowel sounds are normal. There is no distension.     Palpations: Abdomen is soft.     Tenderness: There is no abdominal tenderness.  Musculoskeletal:        General: Normal range of motion.     Cervical back: Normal range of motion and neck supple.  Skin:    General: Skin is warm and dry.  Neurological:     Mental Status: She is alert. Mental status is at baseline.      Data Reviewed: Results for orders placed or performed during the hospital encounter of 08/11/22 (from the past 24 hour(s))  SARS Coronavirus 2 by RT PCR (hospital order, performed in [facility] hospital lab) *cepheid  single result test* Anterior Nasal Swab     Status: None   Collection Time: 09/07/22  5:32 AM   Specimen: Anterior Nasal Swab  Result Value Ref Range   SARS Coronavirus 2 by RT PCR NEGATIVE NEGATIVE  Protime-INR     Status: Abnormal   Collection Time: 09/07/22  5:44 AM  Result Value Ref Range   Prothrombin Time 23.8 (H) 11.4 - 15.2 seconds   INR 2.1 (H) 0.8 - 1.2    I have reviewed pertinent nursing notes, vitals, labs, and images as necessary. I have ordered labwork to follow up on as indicated.  I have reviewed the last notes from staff over past 24 hours. I have discussed patient's care plan and test results with nursing staff, CM/SW,  and other staff as appropriate.  Time spent: Greater than 50% of the 55 minute visit was spent in counseling/coordination of care for the patient as laid out in the A&P.   LOS: 27 days   Lewie Chamber, MD [facility] 09/07/2022, 4:43 PM

## 2022-09-08 DIAGNOSIS — R6521 Severe sepsis with septic shock: Secondary | ICD-10-CM | POA: Diagnosis not present

## 2022-09-08 DIAGNOSIS — A419 Sepsis, unspecified organism: Secondary | ICD-10-CM | POA: Diagnosis not present

## 2022-09-08 LAB — COMPREHENSIVE METABOLIC PANEL
ALT: 9 U/L (ref 0–44)
AST: 19 U/L (ref 15–41)
Albumin: 2.8 g/dL — ABNORMAL LOW (ref 3.5–5.0)
Alkaline Phosphatase: 191 U/L — ABNORMAL HIGH (ref 38–126)
Anion gap: 10 (ref 5–15)
BUN: 13 mg/dL (ref 8–23)
CO2: 26 mmol/L (ref 22–32)
Calcium: 8 mg/dL — ABNORMAL LOW (ref 8.9–10.3)
Chloride: 99 mmol/L (ref 98–111)
Creatinine, Ser: 1.38 mg/dL — ABNORMAL HIGH (ref 0.44–1.00)
GFR, Estimated: 43 mL/min — ABNORMAL LOW (ref >60)
Glucose, Bld: 103 mg/dL — ABNORMAL HIGH (ref 70–99)
Potassium: 3.8 mmol/L (ref 3.5–5.1)
Sodium: 135 mmol/L (ref 135–145)
Total Bilirubin: 2.2 mg/dL — ABNORMAL HIGH (ref 0.3–1.2)
Total Protein: 6.2 g/dL — ABNORMAL LOW (ref 6.5–8.1)

## 2022-09-08 LAB — PROTIME-INR
INR: 1.6 — ABNORMAL HIGH (ref 0.8–1.2)
Prothrombin Time: 19.6 s — ABNORMAL HIGH (ref 11.4–15.2)

## 2022-09-08 MED ORDER — FUROSEMIDE 40 MG PO TABS
40.0000 mg | ORAL_TABLET | Freq: Two times a day (BID) | ORAL | Status: DC
  Administered 2022-09-08 – 2022-09-09 (×2): 40 mg via ORAL
  Filled 2022-09-08 (×2): qty 1

## 2022-09-08 MED ORDER — NYSTATIN 100000 UNIT/GM EX CREA
TOPICAL_CREAM | Freq: Two times a day (BID) | CUTANEOUS | Status: DC
  Filled 2022-09-08: qty 30

## 2022-09-08 NOTE — Plan of Care (Signed)
  Problem: Clinical Measurements: Goal: Will remain free from infection Outcome: Progressing   Problem: Activity: Goal: Risk for activity intolerance will decrease Outcome: Progressing   Problem: Nutrition: Goal: Adequate nutrition will be maintained Outcome: Progressing   Problem: Elimination: Goal: Will not experience complications related to urinary retention Outcome: Progressing

## 2022-09-08 NOTE — Progress Notes (Signed)
Progress Note    [name]   TDD:[id number]  DOB: 09-18-1955  DOA: 08/11/2022     28 PCP: [name], MD  Initial CC: leg pain, fatigue   Hospital Course: 67 year old female with PMH HTN, NASH cirrhosis, HE, morbid obesity, chronic diarrhea, and MDR UTI (e. Coli and Klebsiella) who presented from home with complaints of left leg pain, fatigue, mild confusion, and recent N/V.    Patient lives with her son and cared for by her son.    She was admitted with septic shock due to cellulitis with Flavobacterium bacteremia as well. Initially treated in the ICU.  Treated with pressors.  Transition to midodrine. ID was consulted on 8/4 for flavobacterium bacteremia. Palliative care was consulted on 8/8 for goals of care currently DNR. Started to have vaginal bleeding on 8/12, now resolved. She had MRI of her spine showing acute fractures of T3 and T5 vertebral bodies, moderate height loss at T3.  Mild to moderate height loss at T5. IR consulted for kyphoplasty, but unable to perform due to body habitus.   Currently discharge pending improvement in volume status - significant dependent edema on exam to hips and breasts.  She has diffuse pain, partially related to vertebral fractures.  She has klebsiella pneumonia UTI which is being treated with abx.  Her breasts are erythematous and tender to palpation, ceftriaxone should cover possible cellulitis as well.  Interval History:  No events overnight.  Son present in room this morning as well.  Chest edema improving some.  Still has some erythema noted on bilateral breasts.  Overall has improved and we discussed potential discharge tomorrow.  Assessment and Plan:  Anasarca - improving  Aggressive diuresis as noted below She is grossly overloaded with significant dependent edema in her hips/thighs as well as swelling in her breasts (lower extremity edema is less impressive, though present) Strict I/O, daily weights - modifying lasix today     Cellulitis Difficult as anasarca on exam, but bilateral breasts seemed to have more redness and tenderness on exam which is now showing improvement; will also trial nystatin cream to breasts also  Follow with abx and diuresis (considered imaging given body habitus to eval for deep infection, but just had CT chest on 8/19)  Right Hip Pain CT abd/pelvis without notable changes   Klebsiella Pneumoniae UTI Ceftriaxone   Decompensated NASH cirrhosis with ascites Coagulopathy, anemia, thrombocytopenia, anasarca Acute Hepatic Encephalopathy  H/o GIB  -Continue rifaximin,outpatient GI follow-up -lactulose scheduled -follow LFT's  -Patient appears to have anasarca with hypoalbuminemia, diuretics were held during admission due to AKI, hypotension -BNP 614.9, CT chest with bilateral pleural effusions, ascites, diffuse chest wall/body wall edema -continue lasix and spirono -she has abdominal discomfort, r/o SBP -> paracentesis ordered -> [place] with only mild ascites, no para done -continue lactulose (goal 2-3 BM daily)    Acute on chronic HFpEF, hypertension Moderate aortic stenosis -2D echo 06/2022> EF 60-65%, G1DD, mod AS -CT chest showed bilateral pleural effusions, patchy airspace disease in RUL and superior segment of LLL concerning for multifocal pneumonia (suspect this maybe due to volume overload with lack of respiratory symptoms or infectious symptoms at this time), diffuse chest wall, body wall edema -BNP 614.9, continue lasix and spirono -Strict I's and O's and daily weights   Septic shock (HCC) with AKI, multifactorial Flavobacterium bacteremia, LLE cellulitis and polymicrobial UTI Concern for cat scratch fever -Met criteria on admission with lactic acidosis, encephalopathy, AKI, hypotension, tachycardia and tachypnea -Patient was  admitted to ICU, was placed on pressors, fluids and IV albumin -Patient was treated with cefepime and Zithromax (for 5 days to cover cat  scratch/Bartonella) -Blood cultures positive for flavobacterium. - ID was consulted, placed on meropenem for 2 weeks, completed on 08/26/2022.  Discussed with ID by my partner, does not need any further antibiotics. -CT abdomen 8/15 showed pancolitis -C. difficile PCR negative, GI pathogen panel negative   Acute on chronic back pain -Due to bacteremia, acute on chronic back pain, MRI T-spine, L-spine was obtained - motion artifact -> no evidence of discitis or osteomyelitis -> acute compression fractures of T3 and T5 vertebral bodies with marrow edema, moderate height loss at T3, mild to moderate height loss at T5 -planned for kyphoplasty 8/21, this was canceled (unable to identify safe window due to body habitus - per IR, not candidate for vertebroplasty/kyphoplasty at this time - note from 8/27 by Loyce Dys) -will add on calcitonin. APAP prn, lidocaine patch.  Oxycodone prn.   -pain remains poorly controlled   AKI with uremia, metabolic acidosis, - no obstruction or hydronephrosis on CT imaging, presumed due to septic shock  - Treated with IV pressors, IV fluids.  - trend BMP    Coagulopathy. Postmenopausal vaginal bleeding. Macrocytic anemia with acute on chronic blood loss anemia - Due to cirrhosis her INR has remained elevated. - Patient was started on heparin in the ICU for DVT prophylaxis, subsequently started having postmenopausal vaginal bleeding. -Received Vitamin K x 1 on 8/12 x 5 days.  Heparin held, CT abdomen did not show any RPH -Pelvic ultrasound showed myomatous uterus with several fibroids - Dr Allena Katz discussed with hematology and GYN on-call, received tranexamic acid twice daily.  My partner discussed with Dr Debroah Loop (GYN on-call), on 8/15 recommended Megace 40 mg p.o. twice daily for 2 weeks (course completed in hospital), will arrange outpatient follow-up at OB/GYN center for further evaluation outpatient. -Vaginal bleeding has resolved.   Physical  deconditioning -PT evaluation - son plans to take patient home at discharge    Hypothyroidism -Continue synthroid   Depression -Continue effexor    Headache. No focal deficits. Continue Tylenol.   Chronic right eye pain on movement towards medial side. Resolved. No red flag sign. Needs outpatient follow up   Right Ear Pain L TM normal appearing, R TM obscurred by cerumen (c/o R ear pain 8/26) Debrox flonase Outpatient follow-up with ENT recommended.   Right thyroid nodule 1.5 cm -Incidental seen on CT chest, recommend nonemergent thyroid ultrasound   Obesity Body mass index is 58.19 kg/m.   Old records reviewed in assessment of this patient   DVT prophylaxis:  Place and maintain sequential compression device Start: 08/22/22 1149 SCDs Start: 08/11/22 1108   Code Status:   Code Status: DNR  Mobility Assessment (Last 72 Hours)     Mobility Assessment     Row Name 09/08/22 1000 09/07/22 1919 09/07/22 1310 09/07/22 0833 09/06/22 1947   Does patient have an order for bedrest or is patient medically unstable No - Continue assessment No - Continue assessment -- No - Continue assessment No - Continue assessment   What is the highest level of mobility based on the progressive mobility assessment? Level 5 (Walks with assist in room/hall) - Balance while stepping forward/back and can walk in room with assist - Complete Level 3 (Stands with assist) - Balance while standing  and cannot march in place Level 5 (Walks with assist in room/hall) - Balance while stepping forward/back and can  walk in room with assist - Complete Level 3 (Stands with assist) - Balance while standing  and cannot march in place Level 3 (Stands with assist) - Balance while standing  and cannot march in place   Is the above level different from baseline mobility prior to current illness? -- -- -- Yes - Recommend PT order Yes - Recommend PT order    Row Name 09/06/22 1132 09/06/22 0900 09/05/22 2034        Does patient have an order for bedrest or is patient medically unstable -- No - Continue assessment No - Continue assessment     What is the highest level of mobility based on the progressive mobility assessment? Level 3 (Stands with assist) - Balance while standing  and cannot march in place Level 3 (Stands with assist) - Balance while standing  and cannot march in place Level 3 (Stands with assist) - Balance while standing  and cannot march in place     Is the above level different from baseline mobility prior to current illness? -- Yes - Recommend PT order --              Barriers to discharge: none Disposition Plan:  Home Status is: Inpt  Objective: Blood pressure (!) 106/56, pulse 88, temperature 98.4 F (36.9 C), temperature source Oral, resp. rate 19, height 5\' 2"  (1.575 m), weight (!) 144.3 kg, SpO2 98%.  Examination:  Physical Exam Constitutional:      Appearance: She is well-developed.  HENT:     Head: Normocephalic and atraumatic.     Mouth/Throat:     Mouth: Mucous membranes are moist.  Eyes:     Extraocular Movements: Extraocular movements intact.  Cardiovascular:     Rate and Rhythm: Normal rate and regular rhythm.  Pulmonary:     Effort: Pulmonary effort is normal. No respiratory distress.     Breath sounds: Normal breath sounds. No wheezing.  Abdominal:     General: Bowel sounds are normal. There is no distension.     Palpations: Abdomen is soft.     Tenderness: There is no abdominal tenderness.  Musculoskeletal:        General: Normal range of motion.     Cervical back: Normal range of motion and neck supple.  Skin:    General: Skin is warm and dry.  Neurological:     Mental Status: She is alert. Mental status is at baseline.      Data Reviewed: Results for orders placed or performed during the hospital encounter of 08/11/22 (from the past 24 hour(s))  Protime-INR     Status: Abnormal   Collection Time: 09/08/22  5:53 AM  Result Value Ref Range    Prothrombin Time 19.6 (H) 11.4 - 15.2 seconds   INR 1.6 (H) 0.8 - 1.2  Comprehensive metabolic panel     Status: Abnormal   Collection Time: 09/08/22  5:53 AM  Result Value Ref Range   Sodium 135 135 - 145 mmol/L   Potassium 3.8 3.5 - 5.1 mmol/L   Chloride 99 98 - 111 mmol/L   CO2 26 22 - 32 mmol/L   Glucose, Bld 103 (H) 70 - 99 mg/dL   BUN 13 8 - 23 mg/dL   Creatinine, Ser 8.65 (H) 0.44 - 1.00 mg/dL   Calcium 8.0 (L) 8.9 - 10.3 mg/dL   Total Protein 6.2 (L) 6.5 - 8.1 g/dL   Albumin 2.8 (L) 3.5 - 5.0 g/dL   AST 19 15 - 41  U/L   ALT 9 0 - 44 U/L   Alkaline Phosphatase 191 (H) 38 - 126 U/L   Total Bilirubin 2.2 (H) 0.3 - 1.2 mg/dL   GFR, Estimated 43 (L) >60 mL/min   Anion gap 10 5 - 15    I have reviewed pertinent nursing notes, vitals, labs, and images as necessary. I have ordered labwork to follow up on as indicated.  I have reviewed the last notes from staff over past 24 hours. I have discussed patient's care plan and test results with nursing staff, CM/SW, and other staff as appropriate.    LOS: 28 days   Lewie Chamber, MD [facility] 09/08/2022, 4:30 PM

## 2022-09-08 NOTE — Plan of Care (Signed)

## 2022-09-08 NOTE — Care Management Important Message (Signed)
Important Message  Patient Details IM Letter given Name: [name] MRN: [id number] Date of Birth: May 15, 1955   Medicare Important Message Given:  Yes     [name] 09/08/2022, 10:13 AM

## 2022-09-08 NOTE — Progress Notes (Signed)
Physical Therapy Treatment Patient Details Name: [name] MRN: [id number] DOB: 10/19/1955 Today's Date: 09/08/2022   History of Present Illness Patient is a 67 yr old woman who presented with with fatigue and shortness of breath.  Currently being treated for septic shock due to cellulitis & UTI. Pt is currently pending discharge with further monitoring of volume status, due to significant dependent edema to hips and breasts.  PMH: NASH cirrhosis, HTN, PE, a fib, HTN, HLD    PT Comments   Patient agreeable to getting up, ambulated in /room. Son present, reports recently ambulated  to BR. Continue PT and mobility.    If plan is discharge home, recommend the following: Assistance with cooking/housework;Help with stairs or ramp for entrance;A little help with walking and/or transfers;A little help with bathing/dressing/bathroom   Can travel by private vehicle     Yes  Equipment Recommendations  None recommended by PT    Recommendations for Other Services       Precautions / Restrictions Precautions Precautions: Fall Precaution Comments: T3 T5 comp Fx Restrictions Weight Bearing Restrictions: No     Mobility  Bed Mobility   Bed Mobility: Supine to Sit Rolling: Contact guard assist Sidelying to sit: Contact guard assist       General bed mobility comments: cues and increased time    Transfers Overall transfer level: Needs assistance Equipment used: Rolling walker (2 wheels) Transfers: Sit to/from Stand Sit to Stand: Contact guard assist   Step pivot transfers: Contact guard assist       General transfer comment: stand swith CGA,    Ambulation/Gait Ambulation/Gait assistance: Contact guard assist Gait Distance (Feet): 10 Feet Assistive device: Rolling walker (2 wheels) Gait Pattern/deviations: Decreased stride length, Step-to pattern       General Gait Details: slow and steady   Optometrist     Tilt Bed     Modified Rankin (Stroke Patients Only)       Balance Overall balance assessment: Needs assistance Sitting-balance support: Feet supported Sitting balance-Leahy Scale: Good     Standing balance support: Bilateral upper extremity supported, During functional activity Standing balance-Leahy Scale: Fair Standing balance comment: RW and CGA for static and dynamic                            Cognition Arousal: Alert Behavior During Therapy: WFL for tasks assessed/performed Overall Cognitive Status: Within Functional Limits for tasks assessed                                 General Comments: pleasant, joking, does need encouragement to participate        Exercises      General Comments        Pertinent Vitals/Pain Pain Assessment Pain Assessment: No/denies pain    Home Living                          Prior Function            PT Goals (current goals can now be found in the care plan section) Progress towards PT goals: Progressing toward goals    Frequency    Min 1X/week      PT Plan      Co-evaluation  AM-PAC PT "6 Clicks" Mobility   Outcome Measure  Help needed turning from your back to your side while in a flat bed without using bedrails?: None Help needed moving from lying on your back to sitting on the side of a flat bed without using bedrails?: None Help needed moving to and from a bed to a chair (including a wheelchair)?: A Little Help needed standing up from a chair using your arms (e.g., wheelchair or bedside chair)?: A Little Help needed to walk in hospital room?: A Little Help needed climbing 3-5 steps with a railing? : A Lot 6 Click Score: 19    End of Session   Activity Tolerance: Patient tolerated treatment well Patient left: in chair;with call bell/phone within reach;with family/visitor present Nurse Communication: Mobility status PT Visit Diagnosis: Muscle weakness (generalized)  (M62.81)     Time: 0865-7846 PT Time Calculation (min) (ACUTE ONLY): 12 min  Charges:    $Gait Training: 8-22 mins PT General Charges $$ ACUTE PT VISIT: 1 Visit                     Blanchard Kelch PT Acute Rehabilitation Services Office [phone] Weekend [phone]    Rada Hay 09/08/2022, 4:45 PM

## 2022-09-09 DIAGNOSIS — K7290 Hepatic failure, unspecified without coma: Secondary | ICD-10-CM | POA: Diagnosis not present

## 2022-09-09 DIAGNOSIS — L03116 Cellulitis of left lower limb: Secondary | ICD-10-CM | POA: Diagnosis not present

## 2022-09-09 DIAGNOSIS — A419 Sepsis, unspecified organism: Secondary | ICD-10-CM | POA: Diagnosis not present

## 2022-09-09 DIAGNOSIS — N179 Acute kidney failure, unspecified: Secondary | ICD-10-CM | POA: Diagnosis not present

## 2022-09-09 LAB — COMPREHENSIVE METABOLIC PANEL
ALT: 9 U/L (ref 0–44)
AST: 20 U/L (ref 15–41)
Albumin: 2.8 g/dL — ABNORMAL LOW (ref 3.5–5.0)
Alkaline Phosphatase: 171 U/L — ABNORMAL HIGH (ref 38–126)
Anion gap: 11 (ref 5–15)
BUN: 14 mg/dL (ref 8–23)
CO2: 23 mmol/L (ref 22–32)
Calcium: 8.2 mg/dL — ABNORMAL LOW (ref 8.9–10.3)
Chloride: 103 mmol/L (ref 98–111)
Creatinine, Ser: 1.22 mg/dL — ABNORMAL HIGH (ref 0.44–1.00)
GFR, Estimated: 50 mL/min — ABNORMAL LOW (ref >60)
Glucose, Bld: 106 mg/dL — ABNORMAL HIGH (ref 70–99)
Potassium: 3.4 mmol/L — ABNORMAL LOW (ref 3.5–5.1)
Sodium: 137 mmol/L (ref 135–145)
Total Bilirubin: 2.3 mg/dL — ABNORMAL HIGH (ref 0.3–1.2)
Total Protein: 6.1 g/dL — ABNORMAL LOW (ref 6.5–8.1)

## 2022-09-09 LAB — PROTIME-INR
INR: 1.7 — ABNORMAL HIGH (ref 0.8–1.2)
Prothrombin Time: 20 s — ABNORMAL HIGH (ref 11.4–15.2)

## 2022-09-09 LAB — AMMONIA: Ammonia: 55 umol/L — ABNORMAL HIGH (ref 9–35)

## 2022-09-09 MED ORDER — DICYCLOMINE HCL 10 MG PO CAPS: 10.0000 mg | ORAL_CAPSULE | Freq: Three times a day (TID) | ORAL | 3 refills | Status: DC | PRN

## 2022-09-09 MED ORDER — POTASSIUM CHLORIDE CRYS ER 20 MEQ PO TBCR
40.0000 meq | EXTENDED_RELEASE_TABLET | Freq: Once | ORAL | Status: AC
  Administered 2022-09-09: 40 meq via ORAL
  Filled 2022-09-09: qty 2

## 2022-09-09 MED ORDER — LACTULOSE 10 GM/15ML PO SOLN: 20.0000 g | Freq: Three times a day (TID) | ORAL | 2 refills | Status: DC

## 2022-09-09 MED ORDER — MIDODRINE HCL 10 MG PO TABS: 10.0000 mg | ORAL_TABLET | Freq: Three times a day (TID) | ORAL | 3 refills | Status: DC

## 2022-09-09 MED ORDER — ROPINIROLE HCL 0.25 MG PO TABS: 0.2500 mg | ORAL_TABLET | Freq: Every day | ORAL | 0 refills | Status: DC

## 2022-09-09 MED ORDER — SPIRONOLACTONE 25 MG PO TABS: 25.0000 mg | ORAL_TABLET | Freq: Every day | ORAL | 3 refills | Status: DC

## 2022-09-09 MED ORDER — PANTOPRAZOLE SODIUM 40 MG PO TBEC: 40.0000 mg | DELAYED_RELEASE_TABLET | Freq: Two times a day (BID) | ORAL | 3 refills | Status: DC

## 2022-09-09 MED ORDER — FUROSEMIDE 40 MG PO TABS: 40.0000 mg | ORAL_TABLET | Freq: Two times a day (BID) | ORAL | 2 refills | Status: DC

## 2022-09-09 MED ORDER — TAMSULOSIN HCL 0.4 MG PO CAPS: 0.4000 mg | ORAL_CAPSULE | Freq: Every day | ORAL | 1 refills | Status: DC

## 2022-09-09 NOTE — TOC Transition Note (Signed)
Transition of Care [facility]) - CM/SW Discharge Note   Patient Details  Name: [name] MRN: [id number] Date of Birth: December 03, 1955  [facility]) CM/SW Contact:  Adrian Prows, RN Phone Number: 09/09/2022, 12:54 PM   Clinical Narrative:    D/C orders received; pt home w/ HHPT/OT w/ [facility]; pt and [name] agree to d/c plan; [name] at agency notified; no TOC needs.   Final next level of care: Home w Home Health Services Barriers to Discharge: No Barriers Identified   Patient Goals and CMS Choice CMS Medicare.gov Compare Post Acute Care list provided to:: Patient Represenative (must comment) Choice offered to / list presented to : Adult Children  Discharge Placement                         Discharge Plan and Services Additional resources added to the After Visit Summary for   In-house Referral: NA Discharge Planning Services: NA Post Acute Care Choice: Resumption of Svcs/PTA Provider, Home Health                    HH Arranged: PT, OT [facility] Agency: [facility] ([facility] Date HH Agency Contacted: 09/09/22 Time HH Agency Contacted: 1254 Representative spoke with at [facility] Agency: [name]  Social Determinants of Health (SDOH) Interventions SDOH Screenings   Food Insecurity: No Food Insecurity (08/11/2022)  Housing: Low Risk  (08/11/2022)  Transportation Needs: No Transportation Needs (08/11/2022)  Utilities: Not At Risk (08/11/2022)  Tobacco Use: Medium Risk (08/11/2022)     Readmission Risk Interventions    08/15/2022    3:20 PM 05/18/2022   10:24 AM 05/06/2022    3:39 PM  Readmission Risk Prevention Plan  Transportation Screening Complete Complete Complete  PCP or Specialist Appt within 5-7 Days  Complete Complete  Home Care Screening  Complete Complete  Medication Review (RN CM)  Complete Complete  Medication Review (RN Care Manager) Complete    PCP or Specialist appointment within 3-5 days of discharge Complete    HRI or Home Care  Consult Complete    SW Recovery Care/Counseling Consult Complete    Palliative Care Screening Not Applicable    Skilled Nursing Facility Not Applicable

## 2022-09-09 NOTE — Plan of Care (Signed)
  Problem: Clinical Measurements: Goal: Diagnostic test results will improve Outcome: Progressing   Problem: Health Behavior/Discharge Planning: Goal: Ability to manage health-related needs will improve Outcome: Not Progressing   Problem: Education: Goal: Knowledge of General Education information will improve Description: Including pain rating scale, medication(s)/side effects and non-pharmacologic comfort measures Outcome: Not Progressing

## 2022-09-09 NOTE — Discharge Summary (Signed)
Physician Discharge Summary   [name] YNW:[id number] DOB: October 18, 1955 DOA: 08/11/2022  PCP: [name], MD  Admit date: 08/11/2022 Discharge date: 09/09/2022   Admitted From: Home Disposition:  Home Discharging physician: Lewie Chamber, MD Barriers to discharge: none  Recommendations at discharge: Follow up with OB/GYN. Course of megace completed inpatient; no further bleeding Incidental right thyroid nodule found on imaging; recommended for ultrasound  Home Health: PT, OT  Discharge Condition: stable CODE STATUS: DNR Diet recommendation:  Diet Orders (From admission, onward)     Start     Ordered   09/09/22 0000  Diet - low sodium heart healthy        09/09/22 1220   08/31/22 1625  Diet 2 gram sodium Room service appropriate? Yes; Fluid consistency: Thin  Diet effective now       Question Answer Comment  Room service appropriate? Yes   Fluid consistency: Thin      08/31/22 1624            Hospital Course: 67 year old female with PMH HTN, NASH cirrhosis, HE, morbid obesity, chronic diarrhea, and MDR UTI (e. Coli and Klebsiella) who presented from home with complaints of left leg pain, fatigue, mild confusion, and recent N/V.    Patient lives with her son and cared for by her son.    She was admitted with septic shock due to cellulitis with Flavobacterium bacteremia as well. Initially treated in the ICU.  Treated with pressors.  Transition to midodrine. ID was consulted on 8/4 for flavobacterium bacteremia. Palliative care was consulted on 8/8 for goals of care currently DNR. Started to have vaginal bleeding on 8/12, now resolved. She had MRI of her spine showing acute fractures of T3 and T5 vertebral bodies, moderate height loss at T3.  Mild to moderate height loss at T5. IR consulted for kyphoplasty, but unable to perform due to body habitus.  Assessment and Plan:  Anasarca - resolved  - s/p diuresis as noted below She is grossly overloaded with  significant dependent edema in her hips/thighs as well as swelling in her breasts (lower extremity edema is less impressive, though present) Strict I/O, daily weights - continue lasix at discharge    Cellulitis Difficult as anasarca on exam, but bilateral breasts seemed to have more redness and tenderness on exam which is now showing improvement; will also trial nystatin cream to breasts also  Follow with abx and diuresis (considered imaging given body habitus to eval for deep infection, but just had CT chest on 8/19) - overall improved; no further erythema noted on chest wall or breasts at time of discharge.  Has nystatin cream for ongoing use as needed at home  Right Hip Pain CT abd/pelvis without notable changes   Klebsiella Pneumoniae UTI - resolved  Ceftriaxone course completed inpatient x 5 days   Decompensated NASH cirrhosis with ascites Coagulopathy, anemia, thrombocytopenia, anasarca Acute Hepatic Encephalopathy  H/o GIB  -Continue rifaximin,outpatient GI follow-up -lactulose scheduled -Patient appears to have anasarca with hypoalbuminemia, diuretics were held during admission due to AKI, hypotension -BNP 614.9, CT chest with bilateral pleural effusions, ascites, diffuse chest wall/body wall edema -continue lasix and spirono -she has abdominal discomfort, r/o SBP -> paracentesis ordered -> [place] with only mild ascites, no para done -continue lactulose (goal 2-3 BM daily)    Acute on chronic HFpEF, hypertension Moderate aortic stenosis -2D echo 06/2022> EF 60-65%, G1DD, mod AS -CT chest showed bilateral pleural effusions, patchy airspace disease in RUL and superior  segment of LLL concerning for multifocal pneumonia (suspect this maybe due to volume overload with lack of respiratory symptoms or infectious symptoms at this time), diffuse chest wall, body wall edema -BNP 614.9, continue lasix and spirono   Septic shock (HCC) with AKI, multifactorial Flavobacterium bacteremia, LLE  cellulitis and polymicrobial UTI Concern for cat scratch fever -Met criteria on admission with lactic acidosis, encephalopathy, AKI, hypotension, tachycardia and tachypnea -Patient was admitted to ICU, was placed on pressors, fluids and IV albumin -Patient was treated with cefepime and Zithromax (for 5 days to cover cat scratch/Bartonella) -Blood cultures positive for flavobacterium. - ID was consulted, placed on meropenem for 2 weeks, completed on 08/26/2022.  Discussed with ID by my partner, does not need any further antibiotics. -CT abdomen 8/15 showed pancolitis -C. difficile PCR negative, GI pathogen panel negative   Acute on chronic back pain -Due to bacteremia, acute on chronic back pain, MRI T-spine, L-spine was obtained - motion artifact -> no evidence of discitis or osteomyelitis -> acute compression fractures of T3 and T5 vertebral bodies with marrow edema, moderate height loss at T3, mild to moderate height loss at T5 -planned for kyphoplasty 8/21, this was canceled (unable to identify safe window due to body habitus - per IR, not candidate for vertebroplasty/kyphoplasty at this time - note from 8/27 by Loyce Dys) -continue pain regimen    AKI with uremia, metabolic acidosis - no obstruction or hydronephrosis on CT imaging, presumed due to septic shock  - Treated with IV pressors, IV fluids   Coagulopathy Postmenopausal vaginal bleeding Macrocytic anemia with acute on chronic blood loss anemia - Due to cirrhosis her INR has remained elevated. - Patient was started on heparin in the ICU for DVT prophylaxis, subsequently started having postmenopausal vaginal bleeding. -Received Vitamin K x 1 on 8/12 x 5 days. Heparin held, CT abdomen did not show any RPH -Pelvic ultrasound showed myomatous uterus with several fibroids - Dr Allena Katz discussed with hematology and GYN on-call, received tranexamic acid twice daily.  My partner discussed with Dr Debroah Loop (GYN on-call), on 8/15  recommended Megace 40 mg p.o. twice daily for 2 weeks (course completed in hospital), needs outpt follow up -Vaginal bleeding has resolved.   Physical deconditioning - PT evaluation - son plans to take patient home at discharge    Hypothyroidism -Continue synthroid   Depression -Continue effexor    Headache No focal deficits. Continue Tylenol   Chronic right eye pain on movement towards medial side. Resolved No red flag sign Needs outpatient follow up   Right Ear Pain L TM normal appearing, R TM obscurred by cerumen (c/o R ear pain 8/26) Debrox flonase   Right thyroid nodule 1.5 cm -Incidental seen on CT chest, recommend nonemergent thyroid ultrasound   Morbid Obesity Body mass index is 58.19 kg/m   The patient's acute and chronic medical conditions were treated accordingly. On day of discharge, patient was felt deemed stable for discharge. Patient/family member advised to call PCP or come back to ER if needed.   Principal Diagnosis: Septic shock [facility])  Discharge Diagnoses: Active Hospital Problems   Diagnosis Date Noted   Septic shock (HCC) 08/17/2022   Cellulitis of left lower extremity 08/18/2022   Sepsis with acute renal failure, tubular necrosis, and septic shock (HCC) 08/18/2022   Physical deconditioning 08/17/2022   Goals of care, counseling/discussion 08/17/2022   Decompensated hepatic cirrhosis (HCC) 08/17/2022   Hypotension 08/11/2022   AKI (acute kidney injury) [facility]) 04/30/2022    Resolved Hospital  Problems  No resolved problems to display.     Discharge Instructions     Diet - low sodium heart healthy   Complete by: As directed    Increase activity slowly   Complete by: As directed    No wound care   Complete by: As directed       Allergies as of 09/09/2022       Reactions   Ace Inhibitors Anaphylaxis, Swelling   Ivp Dye [iodinated Contrast Media] Anaphylaxis   Desitin [zinc Oxide] Other (See Comments)   Worsens rash   Chlorhexidine  Rash, Other (See Comments)   WORSENS RASHES   Doxycycline Rash   Penicillins Rash   Zofran [ondansetron] Rash        Medication List     STOP taking these medications    cephALEXin 500 MG capsule Commonly known as: KEFLEX   famotidine 20 MG tablet Commonly known as: PEPCID   metoprolol succinate 25 MG 24 hr tablet Commonly known as: TOPROL-XL   predniSONE 10 MG tablet Commonly known as: DELTASONE       TAKE these medications    acetaminophen 325 MG tablet Commonly known as: TYLENOL Take 650 mg by mouth every 6 (six) hours as needed for moderate pain or headache.   atorvastatin 40 MG tablet Commonly known as: LIPITOR Take 40 mg by mouth in the morning.   ciclopirox 8 % solution Commonly known as: PENLAC Apply 1 application  topically See admin instructions. Apply over all toenails and surrounding skin at bedtime. Apply daily over previous coat. After seven (7) days, may remove with alcohol and continue cycle.   colchicine 0.6 MG tablet Take 0.6 mg by mouth daily as needed (gout).   cyanocobalamin 1000 MCG tablet Commonly known as: VITAMIN B12 Take 2,000 mcg by mouth every Friday.   dicyclomine 10 MG capsule Commonly known as: BENTYL Take 1 capsule (10 mg total) by mouth 3 (three) times daily as needed for spasms (abdominal cramping).   EPINEPHrine 0.3 mg/0.3 mL Soaj injection Commonly known as: EPI-PEN Inject 0.3 mg into the muscle as needed for anaphylaxis.   folic acid 1 MG tablet Commonly known as: FOLVITE Take 1 tablet (1 mg total) by mouth daily.   furosemide 40 MG tablet Commonly known as: LASIX Take 1 tablet (40 mg total) by mouth 2 (two) times daily. What changed:  when to take this Another medication with the same name was removed. Continue taking this medication, and follow the directions you see here.   HYDROcodone-acetaminophen 10-325 MG tablet Commonly known as: NORCO Take 1 tablet by mouth every 6 (six) hours as needed for moderate  pain or severe pain.   hydrOXYzine 25 MG tablet Commonly known as: ATARAX Take 25 mg by mouth 3 (three) times daily as needed for itching.   IRON-VITAMIN C PO Take 1 tablet by mouth daily with breakfast.   lactulose 10 GM/15ML solution Commonly known as: CHRONULAC Take 30 mLs (20 g total) by mouth 3 (three) times daily. What changed:  how much to take when to take this   levothyroxine 150 MCG tablet Commonly known as: SYNTHROID Take 150 mcg by mouth daily before breakfast.   magnesium gluconate 500 MG tablet Commonly known as: MAGONATE Take 1,000 mg by mouth daily.   methocarbamol 500 MG tablet Commonly known as: ROBAXIN Take 500 mg by mouth in the morning.   midodrine 10 MG tablet Commonly known as: PROAMATINE Take 1 tablet (10 mg total) by mouth 3 (three)  times daily with meals. What changed:  medication strength how much to take when to take this reasons to take this   naloxone 4 MG/0.1ML Liqd nasal spray kit Commonly known as: NARCAN Place 1 spray into the nose as needed (accidental overdose).   nystatin powder Apply 1 Application topically 3 (three) times daily as needed (for irritation- affected areas). What changed: Another medication with the same name was changed. Make sure you understand how and when to take each.   nystatin cream Commonly known as: MYCOSTATIN Apply topically 2 (two) times daily. What changed:  how much to take when to take this reasons to take this   pantoprazole 40 MG tablet Commonly known as: PROTONIX Take 1 tablet (40 mg total) by mouth 2 (two) times daily before a meal. What changed: when to take this   potassium chloride 10 MEQ tablet Commonly known as: KLOR-CON Take 20 mEq by mouth in the morning and at bedtime.   promethazine 12.5 MG tablet Commonly known as: PHENERGAN Take 12.5 mg by mouth every 6 (six) hours as needed for nausea or vomiting.   rifaximin 550 MG Tabs tablet Commonly known as: XIFAXAN Take 550 mg by  mouth 2 (two) times daily.   rOPINIRole 0.25 MG tablet Commonly known as: REQUIP Take 1 tablet (0.25 mg total) by mouth at bedtime.   spironolactone 25 MG tablet Commonly known as: ALDACTONE Take 1 tablet (25 mg total) by mouth daily. Start taking on: September 10, 2022   tamsulosin 0.4 MG Caps capsule Commonly known as: FLOMAX Take 1 capsule (0.4 mg total) by mouth daily. Start taking on: September 10, 2022   triamcinolone cream 0.5 % Commonly known as: KENALOG Apply to affected area of right breast twice daily as directed. What changed:  how much to take when to take this reasons to take this   venlafaxine XR 75 MG 24 hr capsule Commonly known as: EFFEXOR-XR Take 75 mg by mouth daily with breakfast.   Vitamin D (Ergocalciferol) 1.25 MG (50000 UNIT) Caps capsule Commonly known as: DRISDOL Take 50,000 Units by mouth every Wednesday.        Follow-up Information     Teressa Senter, [place]. Schedule an appointment as soon as possible for a visit in 2 week(s).   Specialty: Gastroenterology Contact information: [address] [place] [address] [phone]         [name], MD. Schedule an appointment as soon as possible for a visit in 2 week(s).   Specialty: Family Medicine Contact information: [address] [place] [address] [phone]         Adam Phenix, MD. Schedule an appointment as soon as possible for a visit in 2 week(s).   Specialty: Obstetrics and Gynecology Contact information: [address] [place] [address] [phone]                Allergies  Allergen Reactions   Ace Inhibitors Anaphylaxis and Swelling   Ivp Dye [Iodinated Contrast Media] Anaphylaxis   Desitin [Zinc Oxide] Other (See Comments)    Worsens rash   Chlorhexidine Rash and Other (See Comments)    WORSENS RASHES   Doxycycline Rash   Penicillins Rash   Zofran [Ondansetron] Rash     Discharge Exam: BP (!) 106/56   Pulse 88   Temp 98.4 F (36.9 C) (Oral)   Resp 19   Ht 5\' 2"  (1.575 m)   Wt (!) 144.3  kg   SpO2 98%   BMI 58.19 kg/m  Physical Exam Constitutional:      Appearance: She is well-developed.  HENT:     Head: Normocephalic and atraumatic.     Mouth/Throat:     Mouth: Mucous membranes are moist.  Eyes:     Extraocular Movements: Extraocular movements intact.  Cardiovascular:     Rate and Rhythm: Normal rate and regular rhythm.  Pulmonary:     Effort: Pulmonary effort is normal. No respiratory distress.     Breath sounds: Normal breath sounds. No wheezing.  Abdominal:     General: Bowel sounds are normal. There is no distension.     Palpations: Abdomen is soft.     Tenderness: There is no abdominal tenderness.  Musculoskeletal:        General: Normal range of motion.     Cervical back: Normal range of motion and neck supple.  Skin:    General: Skin is warm and dry.  Neurological:     Mental Status: She is alert. Mental status is at baseline.      The results of significant diagnostics from this hospitalization (including imaging, microbiology, ancillary and laboratory) are listed below for reference.   Microbiology: Recent Results (from the past 240 hour(s))  Urine Culture (for pregnant, neutropenic or urologic patients or patients with an indwelling urinary catheter)     Status: Abnormal   Collection Time: 09/03/22  4:23 PM   Specimen: Urine, Clean Catch  Result Value Ref Range Status   Specimen Description   Final    URINE, CLEAN CATCH Performed at [facility], [address] [place], [place] [address]    Special Requests   Final    NONE Performed at [facility], [address] [place], [place] [address]    Culture >=100,000 COLONIES/mL KLEBSIELLA PNEUMONIAE (A)  Final   Report Status 09/06/2022 FINAL  Final   Organism ID, Bacteria KLEBSIELLA PNEUMONIAE (A)  Final      Susceptibility    Klebsiella pneumoniae - MIC*    AMPICILLIN >=32 RESISTANT Resistant     CEFAZOLIN 8 SENSITIVE Sensitive     CEFEPIME <=0.12 SENSITIVE Sensitive     CEFTRIAXONE 1 SENSITIVE Sensitive     CIPROFLOXACIN <=0.25 SENSITIVE Sensitive     GENTAMICIN <=1 SENSITIVE Sensitive     IMIPENEM <=0.25 SENSITIVE Sensitive     NITROFURANTOIN 128 RESISTANT Resistant     TRIMETH/SULFA <=20 SENSITIVE Sensitive     AMPICILLIN/SULBACTAM >=32 RESISTANT Resistant     PIP/TAZO >=128 RESISTANT Resistant     * >=100,000 COLONIES/mL KLEBSIELLA PNEUMONIAE  SARS Coronavirus 2 by RT PCR (hospital order, performed in [facility] hospital lab) *cepheid single result test* Anterior Nasal Swab     Status: None   Collection Time: 09/07/22  5:32 AM   Specimen: Anterior Nasal Swab  Result Value Ref Range Status   SARS Coronavirus 2 by RT PCR NEGATIVE NEGATIVE Final    Comment: (NOTE) SARS-CoV-2 target nucleic acids are NOT DETECTED.  The SARS-CoV-2 RNA is generally detectable in upper and lower respiratory specimens during the acute phase of infection. The lowest concentration of SARS-CoV-2 viral copies this assay can detect is 250 copies / mL. A negative result does not preclude SARS-CoV-2 infection and should not be used as the sole basis for treatment or other patient management decisions.  A negative result may occur with improper specimen collection / handling, submission of specimen other than nasopharyngeal swab, presence of  viral mutation(s) within the areas targeted by this assay, and inadequate number of viral copies (<250 copies / mL). A negative result must be combined with clinical observations, patient history, and epidemiological information.  Fact Sheet for Patients:   RoadLapTop.co.za  Fact Sheet for Healthcare Providers: http://kim-miller.com/  This test is not yet approved or  cleared by the [place] FDA and has been authorized for detection  and/or diagnosis of SARS-CoV-2 by FDA under an Emergency Use Authorization (EUA).  This EUA will remain in effect (meaning this test can be used) for the duration of the COVID-19 declaration under Section 564(b)(1) of the Act, 21 U.S.C. section 360bbb-3(b)(1), unless the authorization is terminated or revoked sooner.  Performed at [facility] [address] [place], [place] [address]      Labs: BNP (last 3 results) Recent Labs    08/30/22 0354  BNP 614.9*   Basic Metabolic Panel: Recent Labs  Lab 09/03/22 1520 09/04/22 2024 09/05/22 1317 09/06/22 0701 09/08/22 0553 09/09/22 0606  NA 139 142 142 137 135 137  K 3.3* 3.1* 3.5 3.6 3.8 3.4*  CL 109 103 101 101 99 103  CO2 24 28 26 27 26 23   GLUCOSE 99 103* 106* 102* 103* 106*  BUN 18 15 13 12 13 14   CREATININE 1.01* 1.13* 1.04* 1.12* 1.38* 1.22*  CALCIUM 7.8* 8.1* 8.0* 7.9* 8.0* 8.2*  MG 1.7 1.4* 1.3* 1.9  --   --   PHOS 3.1 3.0 3.2 3.5  --   --    Liver Function Tests: Recent Labs  Lab 09/04/22 2024 09/05/22 1317 09/06/22 0701 09/08/22 0553 09/09/22 0606  AST 21 24 21 19 20   ALT 16 13 11 9 9   ALKPHOS 157* 152* 169* 191* 171*  BILITOT 2.3* 2.4* 2.8* 2.2* 2.3*  PROT 5.3* 5.5* 5.8* 6.2* 6.1*  ALBUMIN 2.4* 2.5* 2.6* 2.8* 2.8*   No results for input(s): "LIPASE", "AMYLASE" in the last 168 hours. Recent Labs  Lab 09/03/22 1346 09/04/22 2038 09/09/22 0606  AMMONIA 155* 51* 55*   CBC: Recent Labs  Lab 09/03/22 1520 09/04/22 2024 09/05/22 1317 09/06/22 0701  WBC 4.9 6.0 7.1 7.1  NEUTROABS 2.1 3.2 5.5 3.8  HGB 9.1* 8.7* 8.8* 8.9*  HCT 31.3* 27.9* 27.3* 28.7*  MCV 102.3* 97.6 94.1 96.0  PLT 96* 110* 135* 105*   Cardiac Enzymes: No results for input(s): "CKTOTAL", "CKMB", "CKMBINDEX", "TROPONINI" in the last 168 hours. BNP: Invalid input(s): "POCBNP" CBG: No results for input(s): "GLUCAP" in the last 168 hours. D-Dimer No results for input(s): "DDIMER" in the last 72 hours. Hgb  A1c No results for input(s): "HGBA1C" in the last 72 hours. Lipid Profile No results for input(s): "CHOL", "HDL", "LDLCALC", "TRIG", "CHOLHDL", "LDLDIRECT" in the last 72 hours. Thyroid function studies No results for input(s): "TSH", "T4TOTAL", "T3FREE", "THYROIDAB" in the last 72 hours.  Invalid input(s): "FREET3" Anemia work up No results for input(s): "VITAMINB12", "FOLATE", "FERRITIN", "TIBC", "IRON", "RETICCTPCT" in the last 72 hours. Urinalysis    Component Value Date/Time   COLORURINE YELLOW 09/03/2022 1444   APPEARANCEUR CLEAR 09/03/2022 1444   LABSPEC 1.006 09/03/2022 1444   PHURINE 7.0 09/03/2022 1444   GLUCOSEU NEGATIVE 09/03/2022 1444   HGBUR LARGE (A) 09/03/2022 1444   BILIRUBINUR NEGATIVE 09/03/2022 1444   KETONESUR NEGATIVE 09/03/2022 1444   PROTEINUR NEGATIVE 09/03/2022 1444   UROBILINOGEN 0.2 08/21/2013 0017   NITRITE POSITIVE (A) 09/03/2022 1444   LEUKOCYTESUR SMALL (A) 09/03/2022 1444   Sepsis Labs  Recent Labs  Lab 09/03/22 1520 09/04/22 2024 09/05/22 1317 09/06/22 0701  WBC 4.9 6.0 7.1 7.1   Microbiology Recent Results (from the past 240 hour(s))  Urine Culture (for pregnant, neutropenic or urologic patients or patients with an indwelling urinary catheter)     Status: Abnormal   Collection Time: 09/03/22  4:23 PM   Specimen: Urine, Clean Catch  Result Value Ref Range Status   Specimen Description   Final    URINE, CLEAN CATCH Performed at [facility], [address], [place], [place] [address]    Special Requests   Final    NONE Performed at [facility], [address], [place], [place] [address]    Culture >=100,000 COLONIES/mL KLEBSIELLA PNEUMONIAE (A)  Final   Report Status 09/06/2022 FINAL  Final   Organism ID, Bacteria KLEBSIELLA PNEUMONIAE (A)  Final      Susceptibility   Klebsiella pneumoniae - MIC*    AMPICILLIN >=32 RESISTANT Resistant     CEFAZOLIN 8 SENSITIVE Sensitive     CEFEPIME <=0.12  SENSITIVE Sensitive     CEFTRIAXONE 1 SENSITIVE Sensitive     CIPROFLOXACIN <=0.25 SENSITIVE Sensitive     GENTAMICIN <=1 SENSITIVE Sensitive     IMIPENEM <=0.25 SENSITIVE Sensitive     NITROFURANTOIN 128 RESISTANT Resistant     TRIMETH/SULFA <=20 SENSITIVE Sensitive     AMPICILLIN/SULBACTAM >=32 RESISTANT Resistant     PIP/TAZO >=128 RESISTANT Resistant     * >=100,000 COLONIES/mL KLEBSIELLA PNEUMONIAE  SARS Coronavirus 2 by RT PCR (hospital order, performed in [facility] hospital lab) *cepheid single result test* Anterior Nasal Swab     Status: None   Collection Time: 09/07/22  5:32 AM   Specimen: Anterior Nasal Swab  Result Value Ref Range Status   SARS Coronavirus 2 by RT PCR NEGATIVE NEGATIVE Final    Comment: (NOTE) SARS-CoV-2 target nucleic acids are NOT DETECTED.  The SARS-CoV-2 RNA is generally detectable in upper and lower respiratory specimens during the acute phase of infection. The lowest concentration of SARS-CoV-2 viral copies this assay can detect is 250 copies / mL. A negative result does not preclude SARS-CoV-2 infection and should not be used as the sole basis for treatment or other patient management decisions.  A negative result may occur with improper specimen collection / handling, submission of specimen other than nasopharyngeal swab, presence of viral mutation(s) within the areas targeted by this assay, and inadequate number of viral copies (<250 copies / mL). A negative result must be combined with clinical observations, patient history, and epidemiological information.  Fact Sheet for Patients:   RoadLapTop.co.za  Fact Sheet for Healthcare Providers: http://kim-miller.com/  This test is not yet approved or  cleared by the [place] FDA and has been authorized for detection and/or diagnosis of SARS-CoV-2 by FDA under an Emergency Use Authorization (EUA).  This EUA will remain in effect (meaning this test  can be used) for the duration of the COVID-19 declaration under Section 564(b)(1) of the Act, 21 U.S.C. section 360bbb-3(b)(1), unless the authorization is terminated or revoked sooner.  Performed at [facility], [address], [place], [place] [address]     Procedures/Studies: Hedy Jacob THORACIC WITH BONE BIOPSY  Result Date: 09/09/2022 INDICATION: Colinda Zyla is a 67 y.o. female with past medical history of nonalcoholic cirrhosis, hypertension, nephrolithiasis, obesity, PE/DVT, short gut syndrome with chronic diarrhea, hypothyroidism, paroxysmal atrial fibrillation, hyperlipidemia, left eye blindness , depression, aortic stenosis, heart failure, anemia, ascites,  coagulopathy, prior GI bleed, recently treated flavobacterium bacteremia with left lower extremity cellulitis and polymicrobial UTI who presents now with acute on chronic upper back pain. Recent imaging has revealed acute compression fractures of T3 and T5 with marrow edema with moderate height loss at T3 and mild to moderate height loss at T5. Despite conservative pain management measures patient continues to have moderate to severe upper back pain exacerbated with movement and with occasional radiation of pain down lower back. We were asked to perform vertebral body augmentation at these levels. EXAM: Fluoroscopy guided vertebroplasty COMPARISON:  None Available. MEDICATIONS: As antibiotic prophylaxis, Vancomycin 1.5 g IV was ordered pre-procedure and administered intravenously within 1 hour of incision. All current medications are in the EMR and have been reviewed as part of this encounter. ANESTHESIA/SEDATION: No sedation administered as procedure was aborted. FLUOROSCOPY: Radiation Exposure Index (as provided by the fluoroscopic device): Peak skin dose 174 mGy Kerma COMPLICATIONS: None immediate. PROCEDURE: Following a full explanation of the procedure along with the potential associated complications, an informed  witnessed consent was obtained. The patient was placed prone on the fluoroscopic table. Anteroposterior and lateral views of the thoracic spine were obtained. Despite trying multiple windows on live fluoroscopy, I was unable to obtain images with and off contrast and resolution to safely perform the procedure under fluoroscopy. Particularly, the lateral views did not adequately show the pedicles or anterior cortical boundary. Therefore, the procedure was aborted. Imaging limitations are thought to be related to patient's body habitus and anasarca which increases tissue density. IMPRESSION: Fluoroscopic images of the of the thoracic spine obtained without adequate image resolution to safely perform vertebral augmentation. Therefore, procedure was aborted. Electronically Signed   By: Baldemar Lenis M.D.   On: 09/09/2022 09:50   CT ABDOMEN PELVIS WO CONTRAST  Result Date: 09/04/2022 CLINICAL DATA:  Acute abdominal pain EXAM: CT ABDOMEN AND PELVIS WITHOUT CONTRAST TECHNIQUE: Multidetector CT imaging of the abdomen and pelvis was performed following the standard protocol without IV contrast. RADIATION DOSE REDUCTION: This exam was performed according to the departmental dose-optimization program which includes automated exposure control, adjustment of the mA and/or kV according to patient size and/or use of iterative reconstruction technique. COMPARISON:  08/25/2022 FINDINGS: Lower chest: Lung bases are well aerated with small left pleural effusion. Minimal atelectatic changes are on the left noted. Decreased attenuation in the cardiac blood pool is noted suggestive of anemia. Hepatobiliary: Nodular changes of the liver noted consistent with underlying cirrhosis. The gallbladder has been surgically removed. Pancreas: Unremarkable. No pancreatic ductal dilatation or surrounding inflammatory changes. Spleen: Normal in size without focal abnormality. Adrenals/Urinary Tract: Adrenal glands are within  normal limits. Right kidney again demonstrates nonobstructing upper pole stone stable from the prior exam. Left kidney shows no renal calculi. No obstructive changes are seen. The bladder is within normal limits. Stomach/Bowel: No obstructive or inflammatory changes of the colon are seen. Postsurgical changes in the right colon are noted. No obstructive changes are seen. The small bowel and stomach are unremarkable. Vascular/Lymphatic: Aortic atherosclerosis. No enlarged abdominal or pelvic lymph nodes. Reproductive: Uterus demonstrates multiple large calcified uterine fibroids, stable in appearance from the prior exam. No adnexal mass is noted. Other: Free fluid is noted surrounding the liver and spleen consistent with the underlying cirrhosis. Changes of anasarca are noted stable from the prior study. Small fluid containing anterior abdominal wall hernia is again seen. Musculoskeletal: No acute bony abnormality noted. IMPRESSION: Changes consistent with cirrhosis with ascites and anasarca changes  stable from the prior study. Findings suspicious for anemia. Nonobstructing right renal stone. Calcified uterine fibroids. Electronically Signed   By: Alcide Clever M.D.   On: 09/04/2022 20:59   [place] ASCITES (ABDOMEN LIMITED)  Result Date: 09/02/2022 CLINICAL DATA:  Ascites EXAM: LIMITED ABDOMEN ULTRASOUND FOR ASCITES TECHNIQUE: Limited ultrasound survey for ascites was performed in all four abdominal quadrants. COMPARISON:  Ultrasound 08/12/2022 FINDINGS: Evaluation of 4 quadrants of the abdomen has a small pockets of fluid. IMPRESSION: Mild ascites.  Small pockets seen in the 4 quadrants Electronically Signed   By: Karen Kays M.D.   On: 09/02/2022 14:27   CT CHEST WO CONTRAST  Result Date: 08/29/2022 CLINICAL DATA:  Chest wall pain. EXAM: CT CHEST WITHOUT CONTRAST TECHNIQUE: Multidetector CT imaging of the chest was performed following the standard protocol without IV contrast. RADIATION DOSE REDUCTION: This exam  was performed according to the departmental dose-optimization program which includes automated exposure control, adjustment of the mA and/or kV according to patient size and/or use of iterative reconstruction technique. COMPARISON:  MRI thoracic and lumbar spine 08/28/2022. CT chest abdomen and pelvis 08/11/2022. FINDINGS: Cardiovascular: Heart is mildly enlarged. Aorta is normal in size. There are atherosclerotic calcifications of the aorta and coronary arteries. Right-sided central venous catheter tip ends in the SVC. There is no pericardial effusion. Mediastinum/Nodes: No enlarged mediastinal or axillary lymph nodes. Peripherally calcified nodule in the right thyroid gland measures 15 mm. Visualized esophagus is within normal limits. Lungs/Pleura: There are small bilateral pleural effusions, left greater than right. Patchy airspace disease is seen in the right upper lobe posterior medially in the superior segment of the left lower lobe. There are atelectatic changes in both lung bases. There is no pneumothorax. Trachea and central airways are patent. Upper Abdomen: Small amount of ascites in the upper abdomen. Spleen is mildly enlarged. There is a nonobstructing right renal calculus measuring 2 mm. Cholecystectomy clips are present. Musculoskeletal: There is diffuse chest wall/body wall edema. Compression fractures T3 and T5 appear unchanged. IMPRESSION: 1. Small bilateral pleural effusions, left greater than right. 2. Patchy airspace disease in the right upper lobe and superior segment of the left lower lobe worrisome for multifocal pneumonia. 3. Mild cardiomegaly. 4. Small amount of ascites in the upper abdomen. 5. Diffuse chest wall/body wall edema. 6. Splenomegaly. 7. Nonobstructing right renal calculus. 8. Incidental right thyroid nodule measuring 1.5 cm. Recommend non-emergent thyroid ultrasound. Reference: J Am Coll Radiol. 2015 Feb;12(2): 143-50 Aortic Atherosclerosis (ICD10-I70.0). Electronically Signed    By: Darliss Cheney M.D.   On: 08/29/2022 22:05   MR THORACIC SPINE WO CONTRAST  Result Date: 08/28/2022 CLINICAL DATA:  Mid-back pain bacteremia, intractable back pain; Low back pain, infection suspected, positive xray/CT bacteremia, intractable back pain. EXAM: MRI THORACIC AND LUMBAR SPINE WITHOUT CONTRAST TECHNIQUE: Multiplanar and multiecho pulse sequences of the thoracic and lumbar spine were obtained without intravenous contrast. COMPARISON:  MRI thoracic and lumbar spine 06/13/2022. FINDINGS: Moderate motion artifact limits evaluation for fine detail. Axial images and lumbar STIR are nondiagnostic. MRI THORACIC SPINE FINDINGS Alignment:  Normal. Vertebrae: Acute compression fractures of the T3 and T5 vertebral bodies with marrow edema. Moderate height loss at T3. Mild-to-moderate height loss at T5. Within limitations of motion artifact, no evidence of discitis or osteomyelitis. Cord:  Limited evaluation due to motion artifact. Paraspinal and other soft tissues: Diffuse third-spacing of fluid. Disc levels: Multilevel lower thoracic disc degeneration resulting in mild spinal canal stenosis from T7-8 through at least T9-10. MRI LUMBAR SPINE  FINDINGS Segmentation: Conventional numbering is assumed with 5 non-rib-bearing, lumbar type vertebral bodies. Alignment:  Unchanged trace retrolisthesis of L2 on L3. Vertebrae: Normal vertebral body heights. Within limits of motion artifact, no evidence of discitis or osteomyelitis. Conus medullaris and cauda equina: Conus extends to the L1 level. Paraspinal and other soft tissues: Diffuse third-spacing of fluid. Disc levels: Grossly unchanged lumbar spondylosis. No severe spinal canal stenosis. IMPRESSION: 1. Moderate motion artifact limits evaluation for fine detail. Axial images and lumbar STIR are nondiagnostic. 2. Within limitations of motion artifact, no evidence of discitis or osteomyelitis. 3. Acute compression fractures of the T3 and T5 vertebral bodies with  marrow edema. Moderate height loss at T3. Mild-to-moderate height loss at T5. Electronically Signed   By: Orvan Falconer M.D.   On: 08/28/2022 14:15   MR LUMBAR SPINE WO CONTRAST  Result Date: 08/28/2022 CLINICAL DATA:  Mid-back pain bacteremia, intractable back pain; Low back pain, infection suspected, positive xray/CT bacteremia, intractable back pain. EXAM: MRI THORACIC AND LUMBAR SPINE WITHOUT CONTRAST TECHNIQUE: Multiplanar and multiecho pulse sequences of the thoracic and lumbar spine were obtained without intravenous contrast. COMPARISON:  MRI thoracic and lumbar spine 06/13/2022. FINDINGS: Moderate motion artifact limits evaluation for fine detail. Axial images and lumbar STIR are nondiagnostic. MRI THORACIC SPINE FINDINGS Alignment:  Normal. Vertebrae: Acute compression fractures of the T3 and T5 vertebral bodies with marrow edema. Moderate height loss at T3. Mild-to-moderate height loss at T5. Within limitations of motion artifact, no evidence of discitis or osteomyelitis. Cord:  Limited evaluation due to motion artifact. Paraspinal and other soft tissues: Diffuse third-spacing of fluid. Disc levels: Multilevel lower thoracic disc degeneration resulting in mild spinal canal stenosis from T7-8 through at least T9-10. MRI LUMBAR SPINE FINDINGS Segmentation: Conventional numbering is assumed with 5 non-rib-bearing, lumbar type vertebral bodies. Alignment:  Unchanged trace retrolisthesis of L2 on L3. Vertebrae: Normal vertebral body heights. Within limits of motion artifact, no evidence of discitis or osteomyelitis. Conus medullaris and cauda equina: Conus extends to the L1 level. Paraspinal and other soft tissues: Diffuse third-spacing of fluid. Disc levels: Grossly unchanged lumbar spondylosis. No severe spinal canal stenosis. IMPRESSION: 1. Moderate motion artifact limits evaluation for fine detail. Axial images and lumbar STIR are nondiagnostic. 2. Within limitations of motion artifact, no evidence of  discitis or osteomyelitis. 3. Acute compression fractures of the T3 and T5 vertebral bodies with marrow edema. Moderate height loss at T3. Mild-to-moderate height loss at T5. Electronically Signed   By: Orvan Falconer M.D.   On: 08/28/2022 14:15   DG Abd Portable 1V  Result Date: 08/27/2022 CLINICAL DATA:  Abdominal pain EXAM: PORTABLE ABDOMEN - 1 VIEW COMPARISON:  08/15/2022 abdominal radiograph FINDINGS: Cholecystectomy clips are seen in the right upper quadrant of the abdomen. Bulky calcified central pelvic masses up to 6.7 cm compatible with degenerated fibroids as seen on recent CT. Gas-filled mildly dilated small bowel loops throughout the central abdomen up to 3.8 cm diameter. Moderate colonic gas most prominent in the transverse colon. No evidence of pneumatosis or pneumoperitoneum. IMPRESSION: Gas-filled mildly dilated small bowel loops throughout the central abdomen. Moderate colonic gas most prominent in the transverse colon. Findings favor mild adynamic ileus. Electronically Signed   By: Delbert Phenix M.D.   On: 08/27/2022 13:14   DG Chest Port 1 View  Result Date: 08/26/2022 CLINICAL DATA:  Status post central line placement EXAM: PORTABLE CHEST 1 VIEW COMPARISON:  08/12/2022 FINDINGS: Right PICC is again seen in satisfactory position. Cardiac shadow is  enlarged. Aortic calcifications are noted. Central vascular congestion is noted without focal infiltrate. IMPRESSION: Right PICC in satisfactory position. Increased central vascular congestion. Electronically Signed   By: Alcide Clever M.D.   On: 08/26/2022 01:20   CT ABDOMEN PELVIS WO CONTRAST  Result Date: 08/25/2022 CLINICAL DATA:  Abdominal pain EXAM: CT ABDOMEN AND PELVIS WITHOUT CONTRAST TECHNIQUE: Multidetector CT imaging of the abdomen and pelvis was performed following the standard protocol without IV contrast. RADIATION DOSE REDUCTION: This exam was performed according to the departmental dose-optimization program which includes  automated exposure control, adjustment of the mA and/or kV according to patient size and/or use of iterative reconstruction technique. COMPARISON:  CT abdomen/pelvis dated 08/11/2022 FINDINGS: Lower chest: Small bilateral pleural effusions. Mild bibasilar atelectasis. Hepatobiliary: Cirrhosis. Status post cholecystectomy. No intrahepatic or extrahepatic dilatation. Pancreas: Within normal limits. Spleen: Within the upper limits of normal for size. Adrenals/Urinary Tract: Adrenal glands are within normal limits. 3 mm nonobstructing right upper pole renal calculus (series 2/image 25). Kidneys are otherwise within normal limits. No hydronephrosis. Bladder is within normal limits. Stomach/Bowel: Stomach is normal limits. No evidence of bowel obstruction. Appendix is not discretely visualized. Pancolonic wall thickening, most prominent in the ascending and proximal transverse colon (series 2/image 41), suggesting infectious/inflammatory colitis. Vascular/Lymphatic: No evidence of abdominal aortic aneurysm. Atherosclerotic calcifications of the abdominal aorta and branch vessels. No suspicious abdominopelvic lymphadenopathy. Reproductive: Calcified uterine fibroids. No adnexal masses. Other: Moderate abdominopelvic ascites, mildly progressive. Small fat/fluid containing left paramidline lower abdominal wall hernia (series 2/image 55), unchanged. Musculoskeletal: Mild degenerative changes of the visualized thoracolumbar spine. IMPRESSION: Suspected infectious/inflammatory pancolitis. Cirrhosis.  Moderate abdominopelvic ascites, mildly progressive. 3 mm nonobstructing right upper pole renal calculus. Small bilateral pleural effusions. Electronically Signed   By: Charline Bills M.D.   On: 08/25/2022 23:12   US PELVIC COMPLETE WITH TRANSVAGINAL  Result Date: 08/23/2022 CLINICAL DATA:  Postmenopausal bleeding. EXAM: TRANSABDOMINAL AND TRANSVAGINAL ULTRASOUND OF PELVIS TECHNIQUE: Both transabdominal and transvaginal  ultrasound examinations of the pelvis were performed. Transabdominal technique was performed for global imaging of the pelvis including uterus, ovaries, adnexal regions, and pelvic cul-de-sac. It was necessary to proceed with endovaginal exam following the transabdominal exam to visualize the endometrium and ovaries. COMPARISON:  Ultrasound dated 11/06/2011. FINDINGS: Evaluation is limited due to limited transvaginal exam as the patient was not able to tolerate transvaginal imaging. Uterus Measurements: 12.0 x 6.1 x 10.7 cm = volume: 411 ML. The uterus is poorly visualized. Several fibroids including a 4.8 x 3.7 x 4.4 cm right posterior body intramural fibroid, a 5.3 x 4.8 x 6.0 cm anterior upper body and a 2.8 x 2.7 x 3.1 cm anterior mid body submucosal fibroids. Endometrium The endometrium is poorly visualized and not evaluated. Right ovary Not visualized. Left ovary Not visualized. Other findings No abnormal free fluid. IMPRESSION: 1. Myomatous uterus. 2. Nonvisualization of the endometrium and ovaries. Electronically Signed   By: Elgie Collard M.D.   On: 08/23/2022 20:43   DG Abd 1 View  Result Date: 08/15/2022 CLINICAL DATA:  [id number] Encounter for imaging study to confirm nasogastric (NG) tube placement [id number] EXAM: ABDOMEN - 1 VIEW COMPARISON:  11/20/2021. FINDINGS: The bowel gas pattern is non-obstructive. No evidence of pneumoperitoneum. No acute osseous abnormalities. Pulmonary vascular congestion. Correlate clinically to exclude superimposed pneumonia. The soft tissues are otherwise within normal limits. Surgical changes, devices, tubes and lines: An dobhoff tube extends below diaphragm, tip overlying the stomach. IMPRESSION: 1. Dobhoff tube tip overlying the stomach. 2. Pulmonary vascular  congestion. Electronically Signed   By: Jules Schick M.D.   On: 08/15/2022 10:43   VAS [place] LOWER EXTREMITY VENOUS (DVT)  Result Date: 08/13/2022  Lower Venous DVT Study Patient Name:  BERDELLA ROXBURY  Date of  Exam:   08/12/2022 Medical Rec #: [id number]       Accession #:    [id number] Date of Birth: 09/13/1955        Patient Gender: F Patient Age:   18 years Exam Location:  [facility] Procedure:      VAS [place] LOWER EXTREMITY VENOUS (DVT) Referring Phys: Melody Comas --------------------------------------------------------------------------------  Indications: Leg pain and edema, LT>RT.  Limitations: Body habitus and patient pain/tension with compression. Comparison Study: 05-14-2021 Prior lower extremity venous study was negative for                   DVT. Performing Technologist: Jean Rosenthal RDMS, RVT  Examination Guidelines: A complete evaluation includes B-mode imaging, spectral Doppler, color Doppler, and power Doppler as needed of all accessible portions of each vessel. Bilateral testing is considered an integral part of a complete examination. Limited examinations for reoccurring indications may be performed as noted. The reflux portion of the exam is performed with the patient in reverse Trendelenburg.  +---------+---------------+---------+-----------+----------+-------------------+ RIGHT    CompressibilityPhasicitySpontaneityPropertiesThrombus Aging      +---------+---------------+---------+-----------+----------+-------------------+ CFV      Full           Yes      Yes                                      +---------+---------------+---------+-----------+----------+-------------------+ SFJ      Full                                                             +---------+---------------+---------+-----------+----------+-------------------+ FV Prox  Full                                                             +---------+---------------+---------+-----------+----------+-------------------+ FV Mid   Full           Yes      Yes                                      +---------+---------------+---------+-----------+----------+-------------------+ FV DistalFull            Yes      Yes                                      +---------+---------------+---------+-----------+----------+-------------------+ PFV      Full                                                             +---------+---------------+---------+-----------+----------+-------------------+  POP      Full           Yes      Yes                                      +---------+---------------+---------+-----------+----------+-------------------+ PTV      Full                                                             +---------+---------------+---------+-----------+----------+-------------------+ PERO                                                  Not well visualized +---------+---------------+---------+-----------+----------+-------------------+   +---------+---------------+---------+-----------+----------+--------------+ LEFT     CompressibilityPhasicitySpontaneityPropertiesThrombus Aging +---------+---------------+---------+-----------+----------+--------------+ CFV      Full           Yes      Yes                                 +---------+---------------+---------+-----------+----------+--------------+ SFJ      Full                                                        +---------+---------------+---------+-----------+----------+--------------+ FV Prox  Full                                                        +---------+---------------+---------+-----------+----------+--------------+ FV Mid   Full                                                        +---------+---------------+---------+-----------+----------+--------------+ FV DistalFull                                                        +---------+---------------+---------+-----------+----------+--------------+ PFV      Full                                                        +---------+---------------+---------+-----------+----------+--------------+ POP      Full            Yes      Yes                                 +---------+---------------+---------+-----------+----------+--------------+  PTV      Full                                                        +---------+---------------+---------+-----------+----------+--------------+ PERO     Full                                                        +---------+---------------+---------+-----------+----------+--------------+     Summary: RIGHT: - There is no evidence of deep vein thrombosis in the lower extremity. However, portions of this examination were limited- see technologist comments above.  - No cystic structure found in the popliteal fossa.  LEFT: - There is no evidence of deep vein thrombosis in the lower extremity. However, portions of this examination were limited- see technologist comments above.  - No cystic structure found in the popliteal fossa.  *See table(s) above for measurements and observations. Electronically signed by Heath Lark on 08/13/2022 at 1:18:45 PM.    Final    DG Chest Port 1 View  Result Date: 08/12/2022 CLINICAL DATA:  Central line placement EXAM: PORTABLE CHEST 1 VIEW COMPARISON:  Chest x-ray 08/11/2022 FINDINGS: There is a new right-sided central venous catheter with distal tip projecting over the SVC. The heart is enlarged there central pulmonary vascular congestion. There is no pleural effusion or pneumothorax. No acute fractures are seen. IMPRESSION: New right-sided central venous catheter with distal tip projecting over the SVC. No pneumothorax. Electronically Signed   By: Darliss Cheney M.D.   On: 08/12/2022 15:51   US Abdomen Limited RUQ (LIVER/GB)  Result Date: 08/12/2022 CLINICAL DATA:  Abnormal liver function tests EXAM: ULTRASOUND ABDOMEN LIMITED RIGHT UPPER QUADRANT COMPARISON:  CT 08/11/2022 FINDINGS: Gallbladder: Surgically absent Common bile duct: Diameter: 3 mm Liver: Heterogeneous echogenic hepatic parenchyma consistent with fatty liver infiltration.  Portal vein is patent on color Doppler imaging with normal direction of blood flow towards the liver. Other: Mild ascites. IMPRESSION: Previous cholecystectomy.  No ductal dilatation. Nodular heterogeneous liver with some mild ascites. Please correlate with the prior CT Electronically Signed   By: Karen Kays M.D.   On: 08/12/2022 11:10   [place] EKG SITE RITE  Result Date: 08/12/2022 If Site Rite image not attached, placement could not be confirmed due to current cardiac rhythm.  CT CHEST ABDOMEN PELVIS WO CONTRAST  Result Date: 08/11/2022 CLINICAL DATA:  Sepsis, left leg cellulitis EXAM: CT CHEST, ABDOMEN AND PELVIS WITHOUT CONTRAST CT LEFT FEMUR WITHOUT CONTRAST CT LEFT TIBIA AND FIBULA WITHOUT CONTRAST TECHNIQUE: Multidetector CT imaging of the chest, abdomen and pelvis was performed following the standard protocol without IV contrast. RADIATION DOSE REDUCTION: This exam was performed according to the departmental dose-optimization program which includes automated exposure control, adjustment of the mA and/or kV according to patient size and/or use of iterative reconstruction technique. COMPARISON:  04/30/2022 FINDINGS: CT CHEST FINDINGS Cardiovascular: Aortic atherosclerosis. Aortic valve calcifications. Normal heart size. Three-vessel coronary artery calcifications. No pericardial effusion. Mediastinum/Nodes: No enlarged mediastinal, hilar, or axillary lymph nodes. Thyroid gland, trachea, and esophagus demonstrate no significant findings. Lungs/Pleura: Trace left pleural effusion. Bandlike scarring or atelectasis of the lung bases. No pleural effusion or pneumothorax. Musculoskeletal: No chest  wall abnormality. New, although age indeterminate mild superior endplate wedge deformity of T5 with less than 25% anterior height loss (series 12, image 126). CT ABDOMEN PELVIS FINDINGS Hepatobiliary: Coarse, nodular cirrhotic morphology of the liver. No obvious liver lesion on noncontrast CT. Status post  cholecystectomy. No biliary ductal dilatation. Pancreas: Unremarkable. No pancreatic ductal dilatation or surrounding inflammatory changes. Spleen: Mild splenomegaly, maximum span 14.3 cm. Adrenals/Urinary Tract: Adrenal glands are unremarkable. Kidneys are normal, without renal calculi, solid lesion, or hydronephrosis. Bladder is unremarkable. Stomach/Bowel: Stomach is within normal limits. Status post partial right hemicolectomy and ileocolic reanastomosis. No evidence of bowel wall thickening, distention, or inflammatory changes. Vascular/Lymphatic: Aortic atherosclerosis. Large retroperitoneal and mesenteric varices (series 4, image 75, 67). No enlarged abdominal or pelvic lymph nodes. Reproductive: Calcified uterine fibroids. Other: Small, fat and fluid containing low midline ventral hernia (series 4, image 97). Severe anasarca. Small volume ascites throughout the abdomen and pelvis. Musculoskeletal: No acute osseous findings. CT LEFT LOWER EXTREMITY FINDINGS Soft tissues: Severe, diffuse, circumferential anasarca throughout the left lower extremity. To the extent that the right lower extremity is included for comparison, this appears symmetric. No focal fluid collection. Musculoskeletal: Sarcopenia.  No acute findings. Osseous structures and joints: No fracture or dislocation. Tricompartmental arthrosis of the left knee. No knee joint effusion. Vascular: Severe, diffuse atherosclerosis and vascular calcinosis. Superficial, calcified varices throughout the thigh and calf. IMPRESSION: 1. Severe, diffuse, circumferential anasarca throughout the left lower extremity. To the extent that the right lower extremity is included for comparison, this appears symmetric. No focal fluid collection. 2. Cirrhosis with evidence of portal hypertension including mild splenomegaly, large retroperitoneal and mesenteric varices, and small volume ascites throughout the abdomen and pelvis. 3. New, although age indeterminate mild  superior endplate wedge deformity of T5 with less than 25% anterior height loss. Correlate for point tenderness. 4. Coronary artery disease. Aortic Atherosclerosis (ICD10-I70.0). Electronically Signed   By: Jearld Lesch M.D.   On: 08/11/2022 09:28   CT FEMUR LEFT WO CONTRAST  Result Date: 08/11/2022 CLINICAL DATA:  Sepsis, left leg cellulitis EXAM: CT CHEST, ABDOMEN AND PELVIS WITHOUT CONTRAST CT LEFT FEMUR WITHOUT CONTRAST CT LEFT TIBIA AND FIBULA WITHOUT CONTRAST TECHNIQUE: Multidetector CT imaging of the chest, abdomen and pelvis was performed following the standard protocol without IV contrast. RADIATION DOSE REDUCTION: This exam was performed according to the departmental dose-optimization program which includes automated exposure control, adjustment of the mA and/or kV according to patient size and/or use of iterative reconstruction technique. COMPARISON:  04/30/2022 FINDINGS: CT CHEST FINDINGS Cardiovascular: Aortic atherosclerosis. Aortic valve calcifications. Normal heart size. Three-vessel coronary artery calcifications. No pericardial effusion. Mediastinum/Nodes: No enlarged mediastinal, hilar, or axillary lymph nodes. Thyroid gland, trachea, and esophagus demonstrate no significant findings. Lungs/Pleura: Trace left pleural effusion. Bandlike scarring or atelectasis of the lung bases. No pleural effusion or pneumothorax. Musculoskeletal: No chest wall abnormality. New, although age indeterminate mild superior endplate wedge deformity of T5 with less than 25% anterior height loss (series 12, image 126). CT ABDOMEN PELVIS FINDINGS Hepatobiliary: Coarse, nodular cirrhotic morphology of the liver. No obvious liver lesion on noncontrast CT. Status post cholecystectomy. No biliary ductal dilatation. Pancreas: Unremarkable. No pancreatic ductal dilatation or surrounding inflammatory changes. Spleen: Mild splenomegaly, maximum span 14.3 cm. Adrenals/Urinary Tract: Adrenal glands are unremarkable. Kidneys are  normal, without renal calculi, solid lesion, or hydronephrosis. Bladder is unremarkable. Stomach/Bowel: Stomach is within normal limits. Status post partial right hemicolectomy and ileocolic reanastomosis. No evidence of bowel wall thickening, distention, or inflammatory  changes. Vascular/Lymphatic: Aortic atherosclerosis. Large retroperitoneal and mesenteric varices (series 4, image 75, 67). No enlarged abdominal or pelvic lymph nodes. Reproductive: Calcified uterine fibroids. Other: Small, fat and fluid containing low midline ventral hernia (series 4, image 97). Severe anasarca. Small volume ascites throughout the abdomen and pelvis. Musculoskeletal: No acute osseous findings. CT LEFT LOWER EXTREMITY FINDINGS Soft tissues: Severe, diffuse, circumferential anasarca throughout the left lower extremity. To the extent that the right lower extremity is included for comparison, this appears symmetric. No focal fluid collection. Musculoskeletal: Sarcopenia.  No acute findings. Osseous structures and joints: No fracture or dislocation. Tricompartmental arthrosis of the left knee. No knee joint effusion. Vascular: Severe, diffuse atherosclerosis and vascular calcinosis. Superficial, calcified varices throughout the thigh and calf. IMPRESSION: 1. Severe, diffuse, circumferential anasarca throughout the left lower extremity. To the extent that the right lower extremity is included for comparison, this appears symmetric. No focal fluid collection. 2. Cirrhosis with evidence of portal hypertension including mild splenomegaly, large retroperitoneal and mesenteric varices, and small volume ascites throughout the abdomen and pelvis. 3. New, although age indeterminate mild superior endplate wedge deformity of T5 with less than 25% anterior height loss. Correlate for point tenderness. 4. Coronary artery disease. Aortic Atherosclerosis (ICD10-I70.0). Electronically Signed   By: Jearld Lesch M.D.   On: 08/11/2022 09:28   CT TIBIA  FIBULA LEFT WO CONTRAST  Result Date: 08/11/2022 CLINICAL DATA:  Sepsis, left leg cellulitis EXAM: CT CHEST, ABDOMEN AND PELVIS WITHOUT CONTRAST CT LEFT FEMUR WITHOUT CONTRAST CT LEFT TIBIA AND FIBULA WITHOUT CONTRAST TECHNIQUE: Multidetector CT imaging of the chest, abdomen and pelvis was performed following the standard protocol without IV contrast. RADIATION DOSE REDUCTION: This exam was performed according to the departmental dose-optimization program which includes automated exposure control, adjustment of the mA and/or kV according to patient size and/or use of iterative reconstruction technique. COMPARISON:  04/30/2022 FINDINGS: CT CHEST FINDINGS Cardiovascular: Aortic atherosclerosis. Aortic valve calcifications. Normal heart size. Three-vessel coronary artery calcifications. No pericardial effusion. Mediastinum/Nodes: No enlarged mediastinal, hilar, or axillary lymph nodes. Thyroid gland, trachea, and esophagus demonstrate no significant findings. Lungs/Pleura: Trace left pleural effusion. Bandlike scarring or atelectasis of the lung bases. No pleural effusion or pneumothorax. Musculoskeletal: No chest wall abnormality. New, although age indeterminate mild superior endplate wedge deformity of T5 with less than 25% anterior height loss (series 12, image 126). CT ABDOMEN PELVIS FINDINGS Hepatobiliary: Coarse, nodular cirrhotic morphology of the liver. No obvious liver lesion on noncontrast CT. Status post cholecystectomy. No biliary ductal dilatation. Pancreas: Unremarkable. No pancreatic ductal dilatation or surrounding inflammatory changes. Spleen: Mild splenomegaly, maximum span 14.3 cm. Adrenals/Urinary Tract: Adrenal glands are unremarkable. Kidneys are normal, without renal calculi, solid lesion, or hydronephrosis. Bladder is unremarkable. Stomach/Bowel: Stomach is within normal limits. Status post partial right hemicolectomy and ileocolic reanastomosis. No evidence of bowel wall thickening,  distention, or inflammatory changes. Vascular/Lymphatic: Aortic atherosclerosis. Large retroperitoneal and mesenteric varices (series 4, image 75, 67). No enlarged abdominal or pelvic lymph nodes. Reproductive: Calcified uterine fibroids. Other: Small, fat and fluid containing low midline ventral hernia (series 4, image 97). Severe anasarca. Small volume ascites throughout the abdomen and pelvis. Musculoskeletal: No acute osseous findings. CT LEFT LOWER EXTREMITY FINDINGS Soft tissues: Severe, diffuse, circumferential anasarca throughout the left lower extremity. To the extent that the right lower extremity is included for comparison, this appears symmetric. No focal fluid collection. Musculoskeletal: Sarcopenia.  No acute findings. Osseous structures and joints: No fracture or dislocation. Tricompartmental arthrosis of the left knee.  No knee joint effusion. Vascular: Severe, diffuse atherosclerosis and vascular calcinosis. Superficial, calcified varices throughout the thigh and calf. IMPRESSION: 1. Severe, diffuse, circumferential anasarca throughout the left lower extremity. To the extent that the right lower extremity is included for comparison, this appears symmetric. No focal fluid collection. 2. Cirrhosis with evidence of portal hypertension including mild splenomegaly, large retroperitoneal and mesenteric varices, and small volume ascites throughout the abdomen and pelvis. 3. New, although age indeterminate mild superior endplate wedge deformity of T5 with less than 25% anterior height loss. Correlate for point tenderness. 4. Coronary artery disease. Aortic Atherosclerosis (ICD10-I70.0). Electronically Signed   By: Jearld Lesch M.D.   On: 08/11/2022 09:28   DG Chest Port 1 View  Result Date: 08/11/2022 CLINICAL DATA:  Questionable sepsis. Fatigue and shortness of breath. EXAM: PORTABLE CHEST 1 VIEW COMPARISON:  07/29/2022 FINDINGS: Stable cardiac enlargement. Low lung volumes. Scarring is noted within the  left midlung. No pleural fluid, interstitial edema or airspace disease. The visualized skeletal structures are unremarkable. IMPRESSION: Low lung volumes. No active disease. Electronically Signed   By: Signa Kell M.D.   On: 08/11/2022 07:12     Time coordinating discharge: Over 30 minutes    Lewie Chamber, MD  [facility] 09/09/2022, 3:37 PM

## 2022-09-09 NOTE — Progress Notes (Signed)
Mobility Specialist - Progress Note   09/09/22 1128  Mobility  Activity Ambulated with assistance in hallway  Level of Assistance Standby assist, set-up cues, supervision of patient - no hands on  Assistive Device Front wheel walker  Distance Ambulated (ft) 265 ft  Activity Response Tolerated well  Mobility Referral Yes  $Mobility charge 1 Mobility  Mobility Specialist Start Time (ACUTE ONLY) 1107  Mobility Specialist Stop Time (ACUTE ONLY) 1127  Mobility Specialist Time Calculation (min) (ACUTE ONLY) 20 min   Pt received in bed and agreeable to mobility. No complaints during session. Pt to bed after session with all needs met.    [facility]

## 2022-09-09 NOTE — Progress Notes (Signed)
Occupational Therapy Treatment Patient Details Name: [name] MRN: [id number] DOB: 1955-12-28 Today's Date: 09/09/2022   History of present illness Patient is a 67 yr old woman who presented with with fatigue and shortness of breath.  Currently being treated for septic shock due to cellulitis & UTI. Pt is currently pending discharge with further monitoring of volume status, due to significant dependent edema to hips and breasts.  PMH: NASH cirrhosis, HTN, PE, a fib, HTN, HLD   OT comments  Pt was seen for general ADL participation and instruction, functional strengthening, and progression of functional activity. She ambulated to & from the bathroom in her room using a RW, as well as performed toileting at bathroom level and grooming in standing at the sink. She further performed therapeutic exercises with supervision seated EOB. She expressed a desire to return home soon, with possible discharge this weekend. Continue OT plan of care.       If plan is discharge home, recommend the following:  Help with stairs or ramp for entrance;Assist for transportation;A little help with bathing/dressing/bathroom;A little help with walking and/or transfers;Assistance with cooking/housework;Direct supervision/assist for medications management   Equipment Recommendations  None recommended by OT    Recommendations for Other Services      Precautions / Restrictions Precautions Precautions: Fall Precaution Comments: T3 T5 comp Fx Restrictions Other Position/Activity Restrictions: contact precautions       Mobility Bed Mobility Overal bed mobility: Needs Assistance Bed Mobility: Supine to Sit, Sit to Supine     Supine to sit: Used rails, HOB elevated, Supervision Sit to supine: Used rails, Supervision        Transfers Overall transfer level: Needs assistance Equipment used: Rolling walker (2 wheels) Transfers: Sit to/from Stand Sit to Stand: Supervision                     ADL  either performed or assessed with clinical judgement   ADL Overall ADL's : Needs assistance/impaired Eating/Feeding: Independent;Sitting Eating/Feeding Details (indicate cue type and reason): based on clinical judgement Grooming: Supervision/safety;Standing Grooming Details (indicate cue type and reason): She performed hand washing, face washing, and hair brushing in standing at sink level.         Upper Body Dressing : Set up;Sitting Upper Body Dressing Details (indicate cue type and reason): simulated seated EOB     Toilet Transfer: Ambulation;Rolling walker (2 wheels);Regular Toilet;Grab bars;Supervision/safety Toilet Transfer Details (indicate cue type and reason): The pt ambulated to the bathroom in her room using a RW. She used grab bar for support when transferring onto and off the toilet. Toileting- Clothing Manipulation and Hygiene: Supervision/safety Toileting - Clothing Manipulation Details (indicate cue type and reason): Pt performed anterior peri-hygiene after urinating with supervision. She further managed clothing management with SBA.              Cognition Arousal: Alert Behavior During Therapy: WFL for tasks assessed/performed                          Pertinent Vitals/ Pain       Pain Assessment Pain Assessment: No/denies pain         Frequency  Min 1X/week        Progress Toward Goals  OT Goals(current goals can now be found in the care plan section)  Progress towards OT goals: Progressing toward goals  Acute Rehab OT Goals Patient Stated Goal: to return home soon OT Goal Formulation: With patient Time  For Goal Achievement: 09/18/22 Potential to Achieve Goals: Good  Plan         AM-PAC OT "6 Clicks" Daily Activity     Outcome Measure   Help from another person eating meals?: None Help from another person taking care of personal grooming?: A Little Help from another person toileting, which includes using toliet, bedpan, or  urinal?: A Little Help from another person bathing (including washing, rinsing, drying)?: A Little Help from another person to put on and taking off regular upper body clothing?: A Little Help from another person to put on and taking off regular lower body clothing?: A Little 6 Click Score: 19    End of Session Equipment Utilized During Treatment: Rolling walker (2 wheels)  OT Visit Diagnosis: Muscle weakness (generalized) (M62.81);Unsteadiness on feet (R26.81)   Activity Tolerance Patient tolerated treatment well   Patient Left in bed;with call bell/phone within reach;with family/visitor present   Nurse Communication Mobility status        Time: 0940-1009 OT Time Calculation (min): 29 min  Charges: OT General Charges $OT Visit: 1 Visit OT Treatments $Self Care/Home Management : 8-22 mins $Therapeutic Activity: 8-22 mins     Reuben Likes, OTR/L 09/09/2022, 10:51 AM

## 2022-09-23 ENCOUNTER — Encounter: Payer: Self-pay | Admitting: Physician Assistant

## 2022-09-23 ENCOUNTER — Other Ambulatory Visit: Payer: Self-pay | Admitting: Physician Assistant

## 2022-09-23 DIAGNOSIS — S22000A Wedge compression fracture of unspecified thoracic vertebra, initial encounter for closed fracture: Secondary | ICD-10-CM

## 2022-09-23 DIAGNOSIS — S32000A Wedge compression fracture of unspecified lumbar vertebra, initial encounter for closed fracture: Secondary | ICD-10-CM

## 2022-09-27 ENCOUNTER — Ambulatory Visit
Admission: RE | Admit: 2022-09-27 | Discharge: 2022-09-27 | Disposition: A | Discharge status: Home / Self Care | Payer: Medicare (Managed Care) | Source: Ambulatory Visit | Attending: Physician Assistant | Admitting: Physician Assistant

## 2022-09-27 DIAGNOSIS — S32000A Wedge compression fracture of unspecified lumbar vertebra, initial encounter for closed fracture: Secondary | ICD-10-CM

## 2022-09-27 HISTORY — PX: IR RADIOLOGIST EVAL & MGMT: IMG5224

## 2022-09-27 NOTE — Progress Notes (Signed)
IR brief note   The patient was referred to the interventional radiology clinic for further evaluation and management of T3 and T5 symptomatic compression fractures.  On review of patient's chart,  the patient had an attempted T3 and T5 kyphoplasty on August 31, 2022 by my colleague Dr. Baldemar Lenis.  However, the procedure was aborted at that time due to poor visualization of important anatomic landmarks.    In the interim since her discharge, the patient reports approximately 25-30 pound weight loss.  She is still extremity symptomatic from her upper thoracic spine fractures.  Given previous attempt, I will defer the patient to my neuro interventional colleagues as she may benefit from having the procedure done in the hospital with biplane equipment availability.  I have reached out to our schedulers to help coordinate this effort.    Olive Bass, MD  Vascular and Interventional Radiology 09/27/2022 2:56 PM

## 2022-09-30 ENCOUNTER — Inpatient Hospital Stay (HOSPITAL_COMMUNITY)
Admission: EM | Admit: 2022-09-30 | Discharge: 2022-10-12 | DRG: 871 | Disposition: A | Discharge status: Home Health | Payer: Medicare (Managed Care) | Attending: Internal Medicine | Admitting: Internal Medicine

## 2022-09-30 ENCOUNTER — Other Ambulatory Visit: Payer: Self-pay

## 2022-09-30 ENCOUNTER — Emergency Department (HOSPITAL_COMMUNITY): Payer: Medicare (Managed Care)

## 2022-09-30 DIAGNOSIS — Z6841 Body Mass Index (BMI) 40.0 and over, adult: Secondary | ICD-10-CM | POA: Diagnosis not present

## 2022-09-30 DIAGNOSIS — H5462 Unqualified visual loss, left eye, normal vision right eye: Secondary | ICD-10-CM | POA: Diagnosis present

## 2022-09-30 DIAGNOSIS — R4182 Altered mental status, unspecified: Secondary | ICD-10-CM | POA: Diagnosis present

## 2022-09-30 DIAGNOSIS — K90829 Short bowel syndrome, unspecified: Secondary | ICD-10-CM | POA: Diagnosis present

## 2022-09-30 DIAGNOSIS — E039 Hypothyroidism, unspecified: Secondary | ICD-10-CM | POA: Diagnosis present

## 2022-09-30 DIAGNOSIS — A4152 Sepsis due to Pseudomonas: Secondary | ICD-10-CM | POA: Diagnosis present

## 2022-09-30 DIAGNOSIS — E079 Disorder of thyroid, unspecified: Secondary | ICD-10-CM | POA: Diagnosis present

## 2022-09-30 DIAGNOSIS — E872 Acidosis, unspecified: Secondary | ICD-10-CM | POA: Diagnosis present

## 2022-09-30 DIAGNOSIS — E7220 Disorder of urea cycle metabolism, unspecified: Secondary | ICD-10-CM

## 2022-09-30 DIAGNOSIS — Z1152 Encounter for screening for COVID-19: Secondary | ICD-10-CM

## 2022-09-30 DIAGNOSIS — N939 Abnormal uterine and vaginal bleeding, unspecified: Secondary | ICD-10-CM | POA: Insufficient documentation

## 2022-09-30 DIAGNOSIS — D696 Thrombocytopenia, unspecified: Secondary | ICD-10-CM | POA: Diagnosis present

## 2022-09-30 DIAGNOSIS — D500 Iron deficiency anemia secondary to blood loss (chronic): Secondary | ICD-10-CM | POA: Diagnosis present

## 2022-09-30 DIAGNOSIS — N950 Postmenopausal bleeding: Secondary | ICD-10-CM | POA: Diagnosis present

## 2022-09-30 DIAGNOSIS — G8929 Other chronic pain: Secondary | ICD-10-CM | POA: Diagnosis present

## 2022-09-30 DIAGNOSIS — A4150 Gram-negative sepsis, unspecified: Secondary | ICD-10-CM

## 2022-09-30 DIAGNOSIS — A419 Sepsis, unspecified organism: Secondary | ICD-10-CM

## 2022-09-30 DIAGNOSIS — N3000 Acute cystitis without hematuria: Secondary | ICD-10-CM

## 2022-09-30 DIAGNOSIS — K7460 Unspecified cirrhosis of liver: Secondary | ICD-10-CM | POA: Diagnosis present

## 2022-09-30 DIAGNOSIS — E669 Obesity, unspecified: Secondary | ICD-10-CM | POA: Diagnosis present

## 2022-09-30 DIAGNOSIS — N390 Urinary tract infection, site not specified: Secondary | ICD-10-CM | POA: Diagnosis not present

## 2022-09-30 DIAGNOSIS — I700 Atherosclerosis of aorta: Secondary | ICD-10-CM | POA: Diagnosis present

## 2022-09-30 DIAGNOSIS — I9589 Other hypotension: Secondary | ICD-10-CM | POA: Diagnosis present

## 2022-09-30 DIAGNOSIS — K7581 Nonalcoholic steatohepatitis (NASH): Secondary | ICD-10-CM | POA: Diagnosis present

## 2022-09-30 DIAGNOSIS — E66812 Obesity, class 2: Secondary | ICD-10-CM | POA: Diagnosis present

## 2022-09-30 DIAGNOSIS — E861 Hypovolemia: Secondary | ICD-10-CM | POA: Diagnosis not present

## 2022-09-30 DIAGNOSIS — I959 Hypotension, unspecified: Secondary | ICD-10-CM | POA: Diagnosis present

## 2022-09-30 DIAGNOSIS — I129 Hypertensive chronic kidney disease with stage 1 through stage 4 chronic kidney disease, or unspecified chronic kidney disease: Secondary | ICD-10-CM | POA: Diagnosis present

## 2022-09-30 DIAGNOSIS — Z91041 Radiographic dye allergy status: Secondary | ICD-10-CM

## 2022-09-30 DIAGNOSIS — Z880 Allergy status to penicillin: Secondary | ICD-10-CM

## 2022-09-30 DIAGNOSIS — D509 Iron deficiency anemia, unspecified: Secondary | ICD-10-CM | POA: Diagnosis not present

## 2022-09-30 DIAGNOSIS — E1122 Type 2 diabetes mellitus with diabetic chronic kidney disease: Secondary | ICD-10-CM | POA: Diagnosis present

## 2022-09-30 DIAGNOSIS — B965 Pseudomonas (aeruginosa) (mallei) (pseudomallei) as the cause of diseases classified elsewhere: Secondary | ICD-10-CM | POA: Diagnosis not present

## 2022-09-30 DIAGNOSIS — E876 Hypokalemia: Secondary | ICD-10-CM | POA: Diagnosis present

## 2022-09-30 DIAGNOSIS — Z79899 Other long term (current) drug therapy: Secondary | ICD-10-CM

## 2022-09-30 DIAGNOSIS — M4854XA Collapsed vertebra, not elsewhere classified, thoracic region, initial encounter for fracture: Secondary | ICD-10-CM | POA: Diagnosis present

## 2022-09-30 DIAGNOSIS — K219 Gastro-esophageal reflux disease without esophagitis: Secondary | ICD-10-CM | POA: Diagnosis present

## 2022-09-30 DIAGNOSIS — N1831 Chronic kidney disease, stage 3a: Secondary | ICD-10-CM | POA: Diagnosis present

## 2022-09-30 DIAGNOSIS — R6520 Severe sepsis without septic shock: Secondary | ICD-10-CM | POA: Diagnosis present

## 2022-09-30 DIAGNOSIS — Z87891 Personal history of nicotine dependence: Secondary | ICD-10-CM

## 2022-09-30 DIAGNOSIS — G9341 Metabolic encephalopathy: Secondary | ICD-10-CM | POA: Diagnosis present

## 2022-09-30 DIAGNOSIS — K766 Portal hypertension: Secondary | ICD-10-CM | POA: Diagnosis present

## 2022-09-30 DIAGNOSIS — R188 Other ascites: Secondary | ICD-10-CM | POA: Diagnosis present

## 2022-09-30 DIAGNOSIS — Z888 Allergy status to other drugs, medicaments and biological substances status: Secondary | ICD-10-CM

## 2022-09-30 DIAGNOSIS — Z66 Do not resuscitate: Secondary | ICD-10-CM | POA: Diagnosis present

## 2022-09-30 DIAGNOSIS — R7881 Bacteremia: Secondary | ICD-10-CM | POA: Insufficient documentation

## 2022-09-30 DIAGNOSIS — K529 Noninfective gastroenteritis and colitis, unspecified: Secondary | ICD-10-CM | POA: Diagnosis present

## 2022-09-30 DIAGNOSIS — Z881 Allergy status to other antibiotic agents status: Secondary | ICD-10-CM

## 2022-09-30 DIAGNOSIS — Z883 Allergy status to other anti-infective agents status: Secondary | ICD-10-CM

## 2022-09-30 DIAGNOSIS — Z79890 Hormone replacement therapy (postmenopausal): Secondary | ICD-10-CM

## 2022-09-30 DIAGNOSIS — D259 Leiomyoma of uterus, unspecified: Secondary | ICD-10-CM | POA: Diagnosis present

## 2022-09-30 DIAGNOSIS — Z86718 Personal history of other venous thrombosis and embolism: Secondary | ICD-10-CM

## 2022-09-30 DIAGNOSIS — Z86711 Personal history of pulmonary embolism: Secondary | ICD-10-CM

## 2022-09-30 HISTORY — DX: Disorder of urea cycle metabolism, unspecified: E72.20

## 2022-09-30 LAB — URINALYSIS, W/ REFLEX TO CULTURE (INFECTION SUSPECTED)
Bacteria, UA: NONE SEEN
Bilirubin Urine: NEGATIVE
Glucose, UA: NEGATIVE mg/dL
Ketones, ur: NEGATIVE mg/dL
Leukocytes,Ua: NEGATIVE
Nitrite: NEGATIVE
Protein, ur: 30 mg/dL — AB
RBC / HPF: >50 RBC/hpf (ref 0–5)
Specific Gravity, Urine: 1.012 (ref 1.005–1.030)
WBC, UA: >50 WBC/hpf (ref 0–5)
pH: 5 (ref 5.0–8.0)

## 2022-09-30 LAB — CBC WITH DIFFERENTIAL/PLATELET
Abs Immature Granulocytes: 0.08 10*3/uL — ABNORMAL HIGH (ref 0.00–0.07)
Basophils Absolute: 0.1 10*3/uL (ref 0.0–0.1)
Basophils Relative: 0 %
Eosinophils Absolute: 0.1 10*3/uL (ref 0.0–0.5)
Eosinophils Relative: 1 %
HCT: 30.5 % — ABNORMAL LOW (ref 36.0–46.0)
Hemoglobin: 9.6 g/dL — ABNORMAL LOW (ref 12.0–15.0)
Immature Granulocytes: 1 %
Lymphocytes Relative: 4 %
Lymphs Abs: 0.6 10*3/uL — ABNORMAL LOW (ref 0.7–4.0)
MCH: 30.4 pg (ref 26.0–34.0)
MCHC: 31.5 g/dL (ref 30.0–36.0)
MCV: 96.5 fL (ref 80.0–100.0)
Monocytes Absolute: 1 10*3/uL (ref 0.1–1.0)
Monocytes Relative: 7 %
Neutro Abs: 13.4 10*3/uL — ABNORMAL HIGH (ref 1.7–7.7)
Neutrophils Relative %: 87 %
Platelets: 136 10*3/uL — ABNORMAL LOW (ref 150–400)
RBC: 3.16 MIL/uL — ABNORMAL LOW (ref 3.87–5.11)
RDW: 17.1 % — ABNORMAL HIGH (ref 11.5–15.5)
WBC: 15.2 10*3/uL — ABNORMAL HIGH (ref 4.0–10.5)
nRBC: 0 % (ref 0.0–0.2)

## 2022-09-30 LAB — COMPREHENSIVE METABOLIC PANEL
ALT: 24 U/L (ref 0–44)
AST: 41 U/L (ref 15–41)
Albumin: 2.6 g/dL — ABNORMAL LOW (ref 3.5–5.0)
Alkaline Phosphatase: 165 U/L — ABNORMAL HIGH (ref 38–126)
Anion gap: 12 (ref 5–15)
BUN: 25 mg/dL — ABNORMAL HIGH (ref 8–23)
CO2: 19 mmol/L — ABNORMAL LOW (ref 22–32)
Calcium: 8.2 mg/dL — ABNORMAL LOW (ref 8.9–10.3)
Chloride: 105 mmol/L (ref 98–111)
Creatinine, Ser: 1.23 mg/dL — ABNORMAL HIGH (ref 0.44–1.00)
GFR, Estimated: 49 mL/min — ABNORMAL LOW (ref >60)
Glucose, Bld: 108 mg/dL — ABNORMAL HIGH (ref 70–99)
Potassium: 3.8 mmol/L (ref 3.5–5.1)
Sodium: 136 mmol/L (ref 135–145)
Total Bilirubin: 2.3 mg/dL — ABNORMAL HIGH (ref 0.3–1.2)
Total Protein: 6.4 g/dL — ABNORMAL LOW (ref 6.5–8.1)

## 2022-09-30 LAB — LACTIC ACID, PLASMA: Lactic Acid, Venous: 3.3 mmol/L (ref 0.5–1.9)

## 2022-09-30 LAB — I-STAT CHEM 8, ED
BUN: 26 mg/dL — ABNORMAL HIGH (ref 8–23)
Calcium, Ion: 1.09 mmol/L — ABNORMAL LOW (ref 1.15–1.40)
Chloride: 105 mmol/L (ref 98–111)
Creatinine, Ser: 1.4 mg/dL — ABNORMAL HIGH (ref 0.44–1.00)
Glucose, Bld: 101 mg/dL — ABNORMAL HIGH (ref 70–99)
HCT: 26 % — ABNORMAL LOW (ref 36.0–46.0)
Hemoglobin: 8.8 g/dL — ABNORMAL LOW (ref 12.0–15.0)
Potassium: 3.9 mmol/L (ref 3.5–5.1)
Sodium: 140 mmol/L (ref 135–145)
TCO2: 20 mmol/L — ABNORMAL LOW (ref 22–32)

## 2022-09-30 LAB — SARS CORONAVIRUS 2 BY RT PCR: SARS Coronavirus 2 by RT PCR: NEGATIVE

## 2022-09-30 LAB — I-STAT CG4 LACTIC ACID, ED
Lactic Acid, Venous: 3.8 mmol/L (ref 0.5–1.9)
Lactic Acid, Venous: 3.4 mmol/L (ref 0.5–1.9)

## 2022-09-30 LAB — PROTIME-INR
INR: 1.6 — ABNORMAL HIGH (ref 0.8–1.2)
Prothrombin Time: 19.1 seconds — ABNORMAL HIGH (ref 11.4–15.2)

## 2022-09-30 LAB — AMMONIA: Ammonia: 63 umol/L — ABNORMAL HIGH (ref 9–35)

## 2022-09-30 MED ORDER — OXYCODONE HCL 5 MG PO TABS
5.0000 mg | ORAL_TABLET | ORAL | Status: DC | PRN
  Administered 2022-10-01 – 2022-10-11 (×12): 5 mg via ORAL
  Filled 2022-09-30 (×12): qty 1

## 2022-09-30 MED ORDER — LEVOTHYROXINE SODIUM 150 MCG PO TABS
150.0000 ug | ORAL_TABLET | Freq: Every day | ORAL | Status: DC
  Administered 2022-10-01 – 2022-10-12 (×12): 150 ug via ORAL
  Filled 2022-09-30 (×12): qty 1

## 2022-09-30 MED ORDER — ROPINIROLE HCL 0.25 MG PO TABS
0.2500 mg | ORAL_TABLET | Freq: Every day | ORAL | Status: DC
  Administered 2022-09-30 – 2022-10-11 (×12): 0.25 mg via ORAL
  Filled 2022-09-30 (×12): qty 1

## 2022-09-30 MED ORDER — MIDODRINE HCL 5 MG PO TABS
10.0000 mg | ORAL_TABLET | Freq: Three times a day (TID) | ORAL | Status: DC
  Administered 2022-09-30 – 2022-10-12 (×35): 10 mg via ORAL
  Filled 2022-09-30 (×35): qty 2

## 2022-09-30 MED ORDER — LACTULOSE 10 GM/15ML PO SOLN: 20.0000 g | Freq: Two times a day (BID) | ORAL | Status: DC | PRN

## 2022-09-30 MED ORDER — TAMSULOSIN HCL 0.4 MG PO CAPS
0.4000 mg | ORAL_CAPSULE | Freq: Every day | ORAL | Status: DC
  Administered 2022-10-01 – 2022-10-12 (×12): 0.4 mg via ORAL
  Filled 2022-09-30 (×12): qty 1

## 2022-09-30 MED ORDER — LACTATED RINGERS IV SOLN: INTRAVENOUS | Status: DC

## 2022-09-30 MED ORDER — MAGNESIUM GLUCONATE 500 MG PO TABS
500.0000 mg | ORAL_TABLET | Freq: Every day | ORAL | Status: DC
  Administered 2022-10-01 – 2022-10-09 (×9): 500 mg via ORAL
  Filled 2022-09-30 (×9): qty 1

## 2022-09-30 MED ORDER — ACETAMINOPHEN 650 MG RE SUPP: 650.0000 mg | Freq: Three times a day (TID) | RECTAL | Status: DC | PRN

## 2022-09-30 MED ORDER — PROCHLORPERAZINE EDISYLATE 10 MG/2ML IJ SOLN
10.0000 mg | Freq: Four times a day (QID) | INTRAMUSCULAR | Status: DC | PRN
  Administered 2022-10-03: 10 mg via INTRAVENOUS
  Filled 2022-09-30: qty 2

## 2022-09-30 MED ORDER — CICLOPIROX 8 % EX SOLN: 1.0000 "application " | CUTANEOUS | Status: DC

## 2022-09-30 MED ORDER — VENLAFAXINE HCL ER 75 MG PO CP24
75.0000 mg | ORAL_CAPSULE | Freq: Every day | ORAL | Status: DC
  Administered 2022-09-30 – 2022-10-12 (×13): 75 mg via ORAL
  Filled 2022-09-30 (×13): qty 1

## 2022-09-30 MED ORDER — LACTATED RINGERS IV BOLUS (SEPSIS): 500.0000 mL | Freq: Once | INTRAVENOUS | Status: DC

## 2022-09-30 MED ORDER — LACTATED RINGERS IV SOLN: 150.0000 mL/h | INTRAVENOUS | Status: DC

## 2022-09-30 MED ORDER — LACTATED RINGERS IV BOLUS (SEPSIS)
1000.0000 mL | Freq: Once | INTRAVENOUS | Status: AC
  Administered 2022-09-30: 1000 mL via INTRAVENOUS

## 2022-09-30 MED ORDER — NYSTATIN 100000 UNIT/GM EX CREA
TOPICAL_CREAM | Freq: Two times a day (BID) | CUTANEOUS | Status: DC
  Administered 2022-10-09: 1 via TOPICAL
  Filled 2022-09-30: qty 30

## 2022-09-30 MED ORDER — SODIUM CHLORIDE 0.9 % IV BOLUS
500.0000 mL | Freq: Once | INTRAVENOUS | Status: AC
  Administered 2022-09-30: 500 mL via INTRAVENOUS

## 2022-09-30 MED ORDER — LACTATED RINGERS IV BOLUS (SEPSIS): 1000.0000 mL | Freq: Once | INTRAVENOUS | Status: DC

## 2022-09-30 MED ORDER — VITAMIN B-12 1000 MCG PO TABS
2000.0000 ug | ORAL_TABLET | ORAL | Status: DC
  Administered 2022-10-07: 2000 ug via ORAL
  Filled 2022-09-30 (×2): qty 2

## 2022-09-30 MED ORDER — HYDROCODONE-ACETAMINOPHEN 10-325 MG PO TABS
1.0000 | ORAL_TABLET | Freq: Four times a day (QID) | ORAL | Status: DC | PRN
  Administered 2022-09-30: 1 via ORAL
  Filled 2022-09-30: qty 1

## 2022-09-30 MED ORDER — FOLIC ACID 1 MG PO TABS
1.0000 mg | ORAL_TABLET | Freq: Every day | ORAL | Status: DC
  Administered 2022-10-01 – 2022-10-12 (×12): 1 mg via ORAL
  Filled 2022-09-30 (×12): qty 1

## 2022-09-30 MED ORDER — PANTOPRAZOLE SODIUM 40 MG PO TBEC
40.0000 mg | DELAYED_RELEASE_TABLET | Freq: Two times a day (BID) | ORAL | Status: DC
  Administered 2022-09-30 – 2022-10-12 (×24): 40 mg via ORAL
  Filled 2022-09-30 (×24): qty 1

## 2022-09-30 MED ORDER — LACTULOSE 10 GM/15ML PO SOLN
30.0000 g | Freq: Two times a day (BID) | ORAL | Status: DC
  Administered 2022-09-30 – 2022-10-08 (×7): 30 g via ORAL
  Filled 2022-09-30: qty 60
  Filled 2022-09-30: qty 45
  Filled 2022-09-30 (×2): qty 60
  Filled 2022-09-30: qty 45
  Filled 2022-09-30: qty 60
  Filled 2022-09-30: qty 45
  Filled 2022-09-30: qty 60
  Filled 2022-09-30 (×4): qty 45
  Filled 2022-09-30: qty 60
  Filled 2022-09-30: qty 45
  Filled 2022-09-30: qty 60
  Filled 2022-09-30: qty 45
  Filled 2022-09-30: qty 60

## 2022-09-30 MED ORDER — RIFAXIMIN 550 MG PO TABS
550.0000 mg | ORAL_TABLET | Freq: Two times a day (BID) | ORAL | Status: DC
  Administered 2022-09-30 – 2022-10-12 (×24): 550 mg via ORAL
  Filled 2022-09-30 (×24): qty 1

## 2022-09-30 MED ORDER — HYDROMORPHONE HCL 1 MG/ML IJ SOLN: 1.0000 mg | Freq: Once | INTRAMUSCULAR | Status: DC

## 2022-09-30 MED ORDER — HYDROXYZINE HCL 25 MG PO TABS
25.0000 mg | ORAL_TABLET | Freq: Three times a day (TID) | ORAL | Status: DC | PRN
  Administered 2022-10-02 – 2022-10-11 (×6): 25 mg via ORAL
  Filled 2022-09-30 (×6): qty 1

## 2022-09-30 MED ORDER — SODIUM CHLORIDE 0.9 % IV SOLN: INTRAVENOUS | Status: DC

## 2022-09-30 MED ORDER — ACETAMINOPHEN 500 MG PO TABS
500.0000 mg | ORAL_TABLET | Freq: Four times a day (QID) | ORAL | Status: DC | PRN
  Administered 2022-09-30 – 2022-10-05 (×3): 500 mg via ORAL
  Filled 2022-09-30 (×3): qty 1

## 2022-09-30 MED ORDER — SODIUM CHLORIDE 0.9 % IV SOLN
2.0000 g | INTRAVENOUS | Status: DC
  Administered 2022-09-30 – 2022-10-01 (×2): 2 g via INTRAVENOUS
  Filled 2022-09-30 (×2): qty 20

## 2022-09-30 NOTE — ED Notes (Signed)
ED TO INPATIENT HANDOFF REPORT  Name/Age/Gender [name] 67 y.o. female  Code Status    Code Status Orders  (From admission, onward)           Start     Ordered   09/30/22 1246  Do not attempt resuscitation (DNR) Pre-Arrest Interventions Desired  Continuous       Question Answer Comment  If pulseless and not breathing No CPR or chest compressions.   In Pre-Arrest Conditions (Patient Has Pulse and Is Breathing) May intubate, use advanced airway interventions and cardioversion/ACLS medications if appropriate or indicated. May transfer to ICU.   Consent: Discussion documented in EHR or advanced directives reviewed      09/30/22 1247           Code Status History     Date Active Date Inactive Code Status Order ID Comments User Context   08/15/2022 2212 09/09/2022 1909 DNR [id number]  Ranee Gosselin, MD Inpatient   08/11/2022 1109 08/15/2022 2211 DNR [id number]  Norton Blizzard, NP ED   06/12/2022 2222 06/15/2022 1908 Full Code [id number]  Orland Mustard, MD Inpatient   05/16/2022 2308 05/22/2022 1922 Full Code [id number]  Rometta Emery, MD ED   05/01/2022 0022 05/10/2022 2002 Full Code [id number]  Anselm Jungling, DO ED       Home/SNF/Other Home  Chief Complaint Sepsis secondary to UTI (HCC) [A41.9, N39.0]  Level of Care/Admitting Diagnosis ED Disposition     ED Disposition  Admit   Condition  --   Comment  Hospital Area: [facility] [[id number]]  Level of Care: Progressive [102]  Admit to Progressive based on following criteria: MULTISYSTEM THREATS such as stable sepsis, metabolic/electrolyte imbalance with or without encephalopathy that is responding to early treatment.  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   pt stated on admission history review   Heart murmur    Hypertension    Kidney stone    Obesity    Pulmonary embolism (HCC)    Short gut syndrome    Thyroid disease     Allergies Allergies  Allergen Reactions   Ace Inhibitors Anaphylaxis and Swelling   Ivp Dye [Iodinated Contrast Media] Anaphylaxis   Desitin [Zinc Oxide] Other (See Comments)    Worsens rash   Chlorhexidine Rash and Other (See Comments)    WORSENS RASHES   Doxycycline Rash   Penicillins Rash   Zofran [Ondansetron] Rash    IV Location/Drains/Wounds Patient Lines/Drains/Airways Status     Active Line/Drains/Airways     Name Placement date Placement time Site Days   Peripheral IV 09/30/22 22 G Anterior;Left Hand 09/30/22  0809  Hand  less than 1   Peripheral IV 09/30/22 20 G 1.88" Anterior;Distal;Left;Upper Arm 09/30/22  0943  Arm  less than 1   Wound / Incision (Open or Dehisced) 05/02/22 (ITD) Intertriginous Dermatitis Breast Left;Lower 05/02/22  0200  Breast  151   Wound / Incision (Open  or Dehisced) 05/02/22 (ITD) Intertriginous Dermatitis Groin Bilateral 05/02/22  0200  Groin  151   Wound / Incision (Open or Dehisced) 05/02/22 (ITD) Intertriginous Dermatitis Knee Posterior;Bilateral 05/02/22  0200  Knee  151   Wound / Incision (Open or Dehisced) 08/11/22 Skin tear Buttocks Right;Medial two open pink scratches/tears in skin. 08/11/22  --  Buttocks  50   Wound / Incision (Open or Dehisced) 09/03/22 Skin tear Thigh Distal;Left;Posterior 09/03/22  1845  Thigh  27            Labs/Imaging Results for orders placed or performed during the hospital encounter of 09/30/22 (from the past 48  hour(s))  SARS Coronavirus 2 by RT PCR (hospital order, performed in [facility] hospital lab) *cepheid single result test* Anterior Nasal Swab     Status: None   Collection Time: 09/30/22  7:45 AM   Specimen: Anterior Nasal Swab  Result Value Ref Range   SARS Coronavirus 2 by RT PCR NEGATIVE NEGATIVE    Comment: (NOTE) SARS-CoV-2 target nucleic acids are NOT DETECTED.  The SARS-CoV-2 RNA is generally detectable in upper and lower respiratory specimens during the acute phase of infection. The lowest concentration of SARS-CoV-2 viral copies this assay can detect is 250 copies / mL. A negative result does not preclude SARS-CoV-2 infection and should not be used as the sole basis for treatment or other patient management decisions.  A negative result may occur with improper specimen collection / handling, submission of specimen other than nasopharyngeal swab, presence of viral mutation(s) within the areas targeted by this assay, and inadequate number of viral copies (<250 copies / mL). A negative result must be combined with clinical observations, patient history, and epidemiological information.  Fact Sheet for Patients:   RoadLapTop.co.za  Fact Sheet for Healthcare Providers: http://kim-miller.com/  This test is not yet approved or  cleared by the [place] FDA and has been authorized for detection and/or diagnosis of SARS-CoV-2 by FDA under an Emergency Use Authorization (EUA).  This EUA will remain in effect (meaning this test can be used) for the duration of the COVID-19 declaration under Section 564(b)(1) of the Act, 21 U.S.C. section 360bbb-3(b)(1), unless the authorization is terminated or revoked sooner.  Performed at [facility], [address] [place], [place] [address]   Comprehensive metabolic panel     Status: Abnormal   Collection Time: 09/30/22  8:00 AM  Result Value Ref Range   Sodium 136 135 -  145 mmol/L   Potassium 3.8 3.5 - 5.1 mmol/L   Chloride 105 98 - 111 mmol/L   CO2 19 (L) 22 - 32 mmol/L   Glucose, Bld 108 (H) 70 - 99 mg/dL    Comment: Glucose reference range applies only to samples taken after fasting for at least 8 hours.   BUN 25 (H) 8 - 23 mg/dL   Creatinine, Ser 6.04 (H) 0.44 - 1.00 mg/dL   Calcium 8.2 (L) 8.9 - 10.3 mg/dL   Total Protein 6.4 (L) 6.5 - 8.1 g/dL   Albumin 2.6 (L) 3.5 - 5.0 g/dL   AST 41 15 - 41 U/L   ALT 24 0 - 44 U/L   Alkaline Phosphatase 165 (H) 38 - 126 U/L   Total Bilirubin 2.3 (H) 0.3 - 1.2 mg/dL   GFR, Estimated 49 (L) >60 mL/min    Comment: (NOTE) Calculated using the CKD-EPI Creatinine Equation (2021)    Anion gap 12 5 - 15    Comment: Performed at [facility], [address], [place], [place] [address]  CBC WITH DIFFERENTIAL     Status: Abnormal   Collection Time: 09/30/22  8:00 AM  Result Value Ref Range   WBC 15.2 (H) 4.0 - 10.5 K/uL   RBC 3.16 (L) 3.87 - 5.11 MIL/uL   Hemoglobin 9.6 (L) 12.0 - 15.0 g/dL   HCT 91.4 (L) 78.2 - 95.6 %   MCV 96.5 80.0 - 100.0 fL   MCH 30.4 26.0 - 34.0 pg   MCHC 31.5 30.0 - 36.0 g/dL   RDW 21.3 (H) 08.6 - 57.8 %   Platelets 136 (L) 150 - 400 K/uL   nRBC 0.0 0.0 - 0.2 %   Neutrophils Relative % 87 %   Neutro Abs 13.4 (H) 1.7 - 7.7 K/uL   Lymphocytes Relative 4 %   Lymphs Abs 0.6 (L) 0.7 - 4.0 K/uL   Monocytes Relative 7 %   Monocytes Absolute 1.0 0.1 - 1.0 K/uL   Eosinophils Relative 1 %   Eosinophils Absolute 0.1 0.0 - 0.5 K/uL   Basophils Relative 0 %   Basophils Absolute 0.1 0.0 - 0.1 K/uL   Immature Granulocytes 1 %   Abs Immature Granulocytes 0.08 (H) 0.00 - 0.07 K/uL    Comment: Performed at [facility], [address] [place], [place] [address]  Ammonia     Status: Abnormal   Collection Time: 09/30/22  8:00 AM  Result Value Ref Range   Ammonia 63 (H) 9 - 35 umol/L    Comment: Performed at [facility], [address] [place], [place] [address]  Protime-INR     Status: Abnormal   Collection Time: 09/30/22  8:00 AM  Result Value Ref Range   Prothrombin Time 19.1 (H) 11.4 - 15.2 seconds   INR 1.6 (H) 0.8 - 1.2    Comment: (NOTE) INR goal varies based on device and disease states. Performed at [facility], [address], [place], [place] [address]   I-Stat Chem 8, ED     Status: Abnormal   Collection Time: 09/30/22  8:17 AM  Result Value Ref Range   Sodium 140 135 - 145 mmol/L   Potassium 3.9 3.5 - 5.1 mmol/L   Chloride 105 98 - 111 mmol/L   BUN 26 (H) 8 - 23 mg/dL   Creatinine, Ser 0.10 (H) 0.44 - 1.00 mg/dL   Glucose, Bld 272 (H) 70 - 99 mg/dL    Comment: Glucose reference range applies only to samples taken after fasting for at least 8 hours.   Calcium, Ion 1.09 (L) 1.15 - 1.40 mmol/L   TCO2 20 (L) 22 - 32 mmol/L   Hemoglobin 8.8 (L) 12.0 - 15.0 g/dL   HCT 53.6 (L) 64.4 - 03.4 %  I-Stat Lactic Acid, ED     Status: Abnormal   Collection Time: 09/30/22  8:17 AM  Result Value Ref Range   Lactic Acid, Venous 3.8 (HH) 0.5 - 1.9 mmol/L   Comment NOTIFIED PHYSICIAN   Urinalysis, w/ Reflex to Culture (Infection Suspected) -Urine, Catheterized     Status: Abnormal   Collection Time: 09/30/22 11:26 AM  Result Value Ref Range   Specimen Source URINE, CATHETERIZED    Color, Urine YELLOW YELLOW   APPearance HAZY (A) CLEAR   Specific Gravity, Urine 1.012 1.005 - 1.030   pH 5.0 5.0 - 8.0   Glucose, UA NEGATIVE NEGATIVE mg/dL   Hgb urine dipstick LARGE (A) NEGATIVE  Bilirubin Urine NEGATIVE NEGATIVE   Ketones, ur NEGATIVE NEGATIVE mg/dL   Protein, ur 30 (A) NEGATIVE mg/dL   Nitrite NEGATIVE NEGATIVE   Leukocytes,Ua NEGATIVE NEGATIVE   RBC / HPF >50 0 - 5 RBC/hpf   WBC, UA >50 0 - 5 WBC/hpf    Comment:        Reflex urine culture not performed if WBC <=10, OR if Squamous epithelial cells >5. If Squamous epithelial cells >5 suggest recollection.    Bacteria, UA NONE SEEN  NONE SEEN   Squamous Epithelial / HPF 0-5 0 - 5 /HPF    Comment: Performed at [facility], [address] [place], [place] [address]  I-Stat CG4 Lactic Acid     Status: Abnormal   Collection Time: 09/30/22  1:06 PM  Result Value Ref Range   Lactic Acid, Venous 3.4 (HH) 0.5 - 1.9 mmol/L   Comment NOTIFIED PHYSICIAN    CT HEAD WO CONTRAST  Result Date: 09/30/2022 CLINICAL DATA:  Mental status change, unknown cause EXAM: CT HEAD WITHOUT CONTRAST TECHNIQUE: Contiguous axial images were obtained from the base of the skull through the vertex without intravenous contrast. RADIATION DOSE REDUCTION: This exam was performed according to the departmental dose-optimization program which includes automated exposure control, adjustment of the mA and/or kV according to patient size and/or use of iterative reconstruction technique. COMPARISON:  Head CT 06/12/2022 FINDINGS: Brain: No hemorrhage. No hydrocephalus. No extra-axial fluid collection. No CT evidence of an acute cortical infarct. No mass effect. No mass lesion. Vascular: No hyperdense vessel or unexpected calcification. Skull: Normal. Negative for fracture or focal lesion. Sinuses/Orbits: No middle ear or mastoid effusion. Paranasal sinuses are clear. Left lens replacement. Orbits are otherwise unremarkable. Other: None IMPRESSION: No acute intracranial abnormality. Sequela of mild chronic microvascular ischemic change Electronically Signed   By: Lorenza Cambridge M.D.   On: 09/30/2022 08:06   DG Chest Port 1 View  Result Date: 09/30/2022 CLINICAL DATA:  67 year old female with history of altered mental status. EXAM: PORTABLE CHEST 1 VIEW COMPARISON:  Chest x-ray 08/26/2022. FINDINGS: Lung volumes are low. No consolidative airspace disease. No pleural effusions. No pneumothorax. Cephalization of the pulmonary vasculature, without frank pulmonary edema. Heart size is mildly enlarged. Upper mediastinal contours are within normal limits.  Atherosclerotic calcifications are noted in the thoracic aorta. IMPRESSION: 1. Cardiomegaly with pulmonary venous congestion, but no frank pulmonary edema. 2. Aortic atherosclerosis. Electronically Signed   By: Trudie Reed M.D.   On: 09/30/2022 07:47    Pending Labs Unresulted Labs (From admission, onward)     Start     Ordered   10/01/22 0500  CBC  Tomorrow morning,   R        09/30/22 1247   10/01/22 0500  Comprehensive metabolic panel  Tomorrow morning,   R        09/30/22 1247   09/30/22 1600  Lactic acid, plasma  (Lactic Acid)  Once-Timed,   TIMED        09/30/22 1331   09/30/22 1126  Urine Culture  Once,   R        09/30/22 1126   09/30/22 0825  Blood culture (routine x 2)  BLOOD CULTURE X 2,   R (with STAT occurrences)      09/30/22 0824            Vitals/Pain Today's Vitals   09/30/22 1030 09/30/22 1130 09/30/22 1139 09/30/22 1430  BP: 120/70 119/79 134/81 123/63  Pulse: Marland Kitchen)  106 (!) 108 (!) 106 (!) 102  Resp: (!) 30 (!) 22 20 20   Temp:  100.3 F (37.9 C) 98.9 F (37.2 C)   TempSrc:  Axillary    SpO2: 96% 98% 98% 96%  Weight:      Height:      PainSc:        Isolation Precautions Airborne and Contact precautions  Medications Medications  cefTRIAXone (ROCEPHIN) 2 g in sodium chloride 0.9 % 100 mL IVPB (0 g Intravenous Stopped 09/30/22 1045)  lactulose (CHRONULAC) 10 GM/15ML solution 30 g (30 g Oral Given 09/30/22 1422)  acetaminophen (TYLENOL) tablet 500 mg (500 mg Oral Given 09/30/22 1422)    Or  acetaminophen (TYLENOL) suppository 650 mg ( Rectal See Alternative 09/30/22 1422)  prochlorperazine (COMPAZINE) injection 10 mg (has no administration in time range)  lactated ringers infusion (has no administration in time range)  sodium chloride 0.9 % bolus 500 mL (0 mLs Intravenous Stopped 09/30/22 0838)  lactated ringers bolus 1,000 mL (0 mLs Intravenous Stopped 09/30/22 1045)    And  lactated ringers bolus 1,000 mL (0 mLs Intravenous Stopped 09/30/22 1137)     Mobility walks with device

## 2022-09-30 NOTE — ED Provider Notes (Signed)
[place] EMERGENCY DEPARTMENT AT [facility] Provider Note   CSN: [id number] Arrival date & time: 09/30/22  4098     History  Chief Complaint  Patient presents with   Altered Mental Status    [name] is a 67 y.o. female.  HPI   Patient has history of cirrhosis due to [facility], AKI, obesity, reflux, encephalopathy, sepsis, thoracic compression fractures.  EMS was called this morning because the patient is confused.  Patient was in her usual state of health yesterday evening.  This morning she was more confused.  She is not answering questions consistently and she is slow to respond.  No known falls or injuries.  No fevers or chills.  In the emergency room the patient does answer my questions but requires repeat prompting.  She is complaining of back pain.  This is a chronic issue for her.  She denies any trouble with chest pain or abdominal pain.  She denies any headache.  No fevers.  Home Medications Prior to Admission medications   Medication Sig Start Date End Date Taking? Authorizing Provider  acetaminophen (TYLENOL) 325 MG tablet Take 650 mg by mouth every 6 (six) hours as needed for moderate pain or headache.    [provider]  atorvastatin (LIPITOR) 40 MG tablet Take 40 mg by mouth in the morning. 01/19/22   [provider]  ciclopirox (PENLAC) 8 % solution Apply 1 application  topically See admin instructions. Apply over all toenails and surrounding skin at bedtime. Apply daily over previous coat. After seven (7) days, may remove with alcohol and continue cycle.    [provider]  colchicine 0.6 MG tablet Take 0.6 mg by mouth daily as needed (gout).    [provider]  cyanocobalamin (VITAMIN B12) 1000 MCG tablet Take 2,000 mcg by mouth every Friday.    [provider]  dicyclomine (BENTYL) 10 MG capsule Take 1 capsule (10 mg total) by mouth 3 (three) times daily as needed for spasms (abdominal cramping). 09/09/22    Lewie Chamber, MD  EPINEPHrine 0.3 mg/0.3 mL IJ SOAJ injection Inject 0.3 mg into the muscle as needed for anaphylaxis. 08/07/19   [provider]  folic acid (FOLVITE) 1 MG tablet Take 1 tablet (1 mg total) by mouth daily. 06/16/22   Calvert Cantor, MD  furosemide (LASIX) 40 MG tablet Take 1 tablet (40 mg total) by mouth 2 (two) times daily. 09/09/22   Lewie Chamber, MD  HYDROcodone-acetaminophen (NORCO) 10-325 MG tablet Take 1 tablet by mouth every 6 (six) hours as needed for moderate pain or severe pain. 02/25/22   [provider]  hydrOXYzine (ATARAX/VISTARIL) 25 MG tablet Take 25 mg by mouth 3 (three) times daily as needed for itching.    [provider]  IRON-VITAMIN C PO Take 1 tablet by mouth daily with breakfast.    [provider]  lactulose (CHRONULAC) 10 GM/15ML solution Take 30 mLs (20 g total) by mouth 3 (three) times daily. 09/09/22   Lewie Chamber, MD  levothyroxine (SYNTHROID) 150 MCG tablet Take 150 mcg by mouth daily before breakfast. 10/26/21   [provider]  magnesium gluconate (MAGONATE) 500 MG tablet Take 1,000 mg by mouth daily.    [provider]  methocarbamol (ROBAXIN) 500 MG tablet Take 500 mg by mouth in the morning. 03/04/22   [provider]  midodrine (PROAMATINE) 10 MG tablet Take 1 tablet (10 mg total) by mouth 3 (three) times daily with meals. 09/09/22  Lewie Chamber, MD  naloxone [facility]) nasal spray 4 mg/0.1 mL Place 1 spray into the nose as needed (accidental overdose). 03/25/22   [provider]  nystatin cream (MYCOSTATIN) Apply topically 2 (two) times daily. Patient taking differently: Apply 1 Application topically 2 (two) times daily as needed for dry skin (or irritation). 05/22/22   Leatha Gilding, MD  nystatin powder Apply 1 Application topically 3 (three) times daily as needed (for irritation- affected areas).    [provider]  pantoprazole (PROTONIX) 40 MG tablet Take 1  tablet (40 mg total) by mouth 2 (two) times daily before a meal. 09/09/22   Lewie Chamber, MD  potassium chloride (KLOR-CON) 10 MEQ tablet Take 20 mEq by mouth in the morning and at bedtime.    [provider]  promethazine (PHENERGAN) 12.5 MG tablet Take 12.5 mg by mouth every 6 (six) hours as needed for nausea or vomiting.    [provider]  rifaximin (XIFAXAN) 550 MG TABS tablet Take 550 mg by mouth 2 (two) times daily.    [provider]  rOPINIRole (REQUIP) 0.25 MG tablet Take 1 tablet (0.25 mg total) by mouth at bedtime. 09/09/22   Lewie Chamber, MD  spironolactone (ALDACTONE) 25 MG tablet Take 1 tablet (25 mg total) by mouth daily. 09/10/22   Lewie Chamber, MD  tamsulosin (FLOMAX) 0.4 MG CAPS capsule Take 1 capsule (0.4 mg total) by mouth daily. 09/10/22   Lewie Chamber, MD  triamcinolone cream (KENALOG) 0.5 % Apply to affected area of right breast twice daily as directed. Patient taking differently: Apply 1 Application topically 2 (two) times daily as needed (for rashes- affected areas). 05/10/22   Osvaldo Shipper, MD  venlafaxine XR (EFFEXOR-XR) 75 MG 24 hr capsule Take 75 mg by mouth daily with breakfast. 08/17/21   [provider]  Vitamin D, Ergocalciferol, (DRISDOL) 50000 UNITS CAPS Take 50,000 Units by mouth every Wednesday.    [provider]      Allergies    Ace inhibitors, Ivp dye [iodinated contrast media], Desitin [zinc oxide], Chlorhexidine, Doxycycline, Penicillins, and Zofran [ondansetron]    Review of Systems   Review of Systems  Physical Exam Updated Vital Signs BP 134/81   Pulse (!) 106   Temp 98.9 F (37.2 C)   Resp 20   Ht 1.575 m (5\' 2" )   Wt (!) 144 kg   SpO2 98%   BMI 58.06 kg/m  Physical Exam Vitals and nursing note reviewed.  Constitutional:      Appearance: She is well-developed. She is ill-appearing.  HENT:     Head: Normocephalic and atraumatic.     Right Ear: External ear normal.     Left Ear:  External ear normal.  Eyes:     General: Scleral icterus present.        Right eye: No discharge.        Left eye: No discharge.     Conjunctiva/sclera: Conjunctivae normal.  Neck:     Trachea: No tracheal deviation.  Cardiovascular:     Rate and Rhythm: Normal rate and regular rhythm.  Pulmonary:     Effort: Pulmonary effort is normal. No respiratory distress.     Breath sounds: Normal breath sounds. No stridor. No wheezing or rales.  Abdominal:     General: Bowel sounds are normal. There is no distension.     Palpations: Abdomen is soft.     Tenderness: There is no abdominal tenderness. There is no guarding or rebound.  Musculoskeletal:        General: No tenderness or deformity.     Cervical back: Neck supple.  Skin:    General: Skin is warm and dry.     Coloration: Skin is jaundiced.     Findings: Rash present.     Comments: Yeast like infection intertriginous area below breast, area of erythema sacral area, no breakdown of skin  Neurological:     General: No focal deficit present.     Mental Status: She is alert. She is disoriented.     GCS: GCS eye subscore is 4. GCS verbal subscore is 4. GCS motor subscore is 6.     Cranial Nerves: No cranial nerve deficit, dysarthria or facial asymmetry.     Sensory: No sensory deficit.     Motor: Weakness present. No abnormal muscle tone or seizure activity.     Coordination: Coordination normal.     Comments: Patient follows commands, able to tell me her name, not sure of the date or location generalized weakness  Psychiatric:        Mood and Affect: Mood normal.     ED Results / Procedures / Treatments   Labs (all labs ordered are listed, but only abnormal results are displayed) Labs Reviewed  COMPREHENSIVE METABOLIC PANEL - Abnormal; Notable for the following components:      Result Value   CO2 19 (*)    Glucose, Bld 108 (*)    BUN 25 (*)    Creatinine, Ser 1.23 (*)    Calcium 8.2 (*)    Total Protein 6.4 (*)    Albumin  2.6 (*)    Alkaline Phosphatase 165 (*)    Total Bilirubin 2.3 (*)    GFR, Estimated 49 (*)    All other components within normal limits  CBC WITH DIFFERENTIAL/PLATELET - Abnormal; Notable for the following components:   WBC 15.2 (*)    RBC 3.16 (*)    Hemoglobin 9.6 (*)    HCT 30.5 (*)    RDW 17.1 (*)    Platelets 136 (*)    Neutro Abs 13.4 (*)    Lymphs Abs 0.6 (*)    Abs Immature Granulocytes 0.08 (*)    All other components within normal limits  AMMONIA - Abnormal; Notable for the following components:   Ammonia 63 (*)    All other components within normal limits  PROTIME-INR - Abnormal; Notable for the following components:   Prothrombin Time 19.1 (*)    INR 1.6 (*)    All other components within normal limits  URINALYSIS, W/ REFLEX TO CULTURE (INFECTION SUSPECTED) - Abnormal; Notable for the following components:   APPearance HAZY (*)    Hgb urine dipstick LARGE (*)    Protein, ur 30 (*)    All other components within normal limits  I-STAT CHEM 8, ED - Abnormal; Notable for the following components:   BUN 26 (*)    Creatinine, Ser 1.40 (*)    Glucose, Bld 101 (*)    Calcium, Ion 1.09 (*)    TCO2 20 (*)    Hemoglobin 8.8 (*)    HCT 26.0 (*)    All other components within normal limits  I-STAT CG4 LACTIC ACID, ED - Abnormal; Notable for the following components:   Lactic Acid, Venous 3.8 (*)    All other components within normal limits  SARS CORONAVIRUS 2 BY RT PCR  CULTURE, BLOOD (ROUTINE X 2)  CULTURE, BLOOD (ROUTINE X 2)  URINE CULTURE  I-STAT CG4 LACTIC ACID, ED    EKG EKG Interpretation Date/Time:  Friday September 30 2022 07:24:21 EDT Ventricular Rate:  116 PR Interval:  184 QRS Duration:  72 QT Interval:  319 QTC Calculation: 444 R Axis:   2  Text Interpretation: Sinus tachycardia Anterior infarct, old No significant change since last tracing Confirmed by Linwood Dibbles [phone]) on 09/30/2022 7:32:20 AM  Radiology CT HEAD WO CONTRAST  Result Date:  09/30/2022 CLINICAL DATA:  Mental status change, unknown cause EXAM: CT HEAD WITHOUT CONTRAST TECHNIQUE: Contiguous axial images were obtained from the base of the skull through the vertex without intravenous contrast. RADIATION DOSE REDUCTION: This exam was performed according to the departmental dose-optimization program which includes automated exposure control, adjustment of the mA and/or kV according to patient size and/or use of iterative reconstruction technique. COMPARISON:  Head CT 06/12/2022 FINDINGS: Brain: No hemorrhage. No hydrocephalus. No extra-axial fluid collection. No CT evidence of an acute cortical infarct. No mass effect. No mass lesion. Vascular: No hyperdense vessel or unexpected calcification. Skull: Normal. Negative for fracture or focal lesion. Sinuses/Orbits: No middle ear or mastoid effusion. Paranasal sinuses are clear. Left lens replacement. Orbits are otherwise unremarkable. Other: None IMPRESSION: No acute intracranial abnormality. Sequela of mild chronic microvascular ischemic change Electronically Signed   By: Lorenza Cambridge M.D.   On: 09/30/2022 08:06   DG Chest Port 1 View  Result Date: 09/30/2022 CLINICAL DATA:  67 year old female with history of altered mental status. EXAM: PORTABLE CHEST 1 VIEW COMPARISON:  Chest x-ray 08/26/2022. FINDINGS: Lung volumes are low. No consolidative airspace disease. No pleural effusions. No pneumothorax. Cephalization of the pulmonary vasculature, without frank pulmonary edema. Heart size is mildly enlarged. Upper mediastinal contours are within normal limits. Atherosclerotic calcifications are noted in the thoracic aorta. IMPRESSION: 1. Cardiomegaly with pulmonary venous congestion, but no frank pulmonary edema. 2. Aortic atherosclerosis. Electronically Signed   By: Trudie Reed M.D.   On: 09/30/2022 07:47    Procedures .Critical Care  Performed by: Linwood Dibbles, MD Authorized by: Linwood Dibbles, MD   Critical care provider statement:     Critical care time (minutes):  45   Critical care was time spent personally by me on the following activities:  Development of treatment plan with patient or surrogate, discussions with consultants, evaluation of patient's response to treatment, examination of patient, ordering and review of laboratory studies, ordering and review of radiographic studies, ordering and performing treatments and interventions, pulse oximetry, re-evaluation of patient's condition and review of old charts     Medications Ordered in ED Medications  cefTRIAXone (ROCEPHIN) 2 g in sodium chloride 0.9 % 100 mL IVPB (0 g Intravenous Stopped 09/30/22 1045)  sodium chloride 0.9 % bolus 500 mL (0 mLs Intravenous Stopped 09/30/22 0838)  lactated ringers bolus 1,000 mL (0 mLs Intravenous Stopped 09/30/22 1045)    And  lactated ringers bolus 1,000 mL (0 mLs Intravenous Stopped 09/30/22 1137)    ED Course/ Medical Decision Making/ A&P Clinical Course as of 09/30/22 1236  Fri Sep 30, 2022  0821 Patient does have a low-grade temperature.  Lactic acid level is elevated at 3.8.  She does have leukocytosis, will cover empirically for possible evolving sepsis [JK]  0823 Chest x-ray without acute infection [JK]  0823 Head CT without acute changes [JK]  0954 Ammonia(!) Monia elevated at 63, similar to previous values [JK]  1130 Patient complaining of persistent breathing issues.  Will hold off on additional fluid boluses.  Oxygen saturation remains  normal [JK]  1207 urinalysis is consistent with a urinary tract infection. [JK]  1214 Blood pressure remained stable.  Most recent systolic 134 of 81.  Patient is alert and more talkative answering questions appropriately.  Normal perfusion noted on exam.  Patient responding to treatment appropriately.  Repeat lactic is pending [JK]    Clinical Course User Index [JK] Linwood Dibbles, MD                                 Medical Decision Making Problems Addressed: Acute cystitis without  hematuria: acute illness or injury that poses a threat to life or bodily functions Altered mental status, unspecified altered mental status type: acute illness or injury that poses a threat to life or bodily functions  Amount and/or Complexity of Data Reviewed Labs: ordered. Decision-making details documented in ED Course. Radiology: ordered and independent interpretation performed.  Risk Prescription drug management. Decision regarding hospitalization.   Patient presented to the ED for evaluation of altered mental status confusion.  Patient has known history of nonalcoholic cirrhosis and hepatic encephalopathy.  In the ED patient noted to have low-grade temperature concerning for the possibility of infection.  Patient's initial blood pressure was low following sepsis was a concern.  Patient was started on IV fluids and antibiotics.  Her laboratory test are notable for urinary tract infection.  She does have leukocytosis and an elevated lactic acid level.  I do believe this is the source of her infection and confusion.  Patient was treated with IV fluids and antibiotics.  Her mental status has improved.  Patient is now alert and awake and answering questions appropriately.  Her ammonia level slightly elevated but decreased compared to previous.  Fluid boluses ordered but with her improving mental status, vascular congestion noted on chest x-ray and tachypnea will hold off on a complete 30 cc/kg bolus.  Will reassess based on her repeat lactic acid level.  Case discussed with Dr. Robb Matar will plan admission to hospital for further treatment        Final Clinical Impression(s) / ED Diagnoses Final diagnoses:  Altered mental status, unspecified altered mental status type  Acute cystitis without hematuria    Rx / DC Orders ED Discharge Orders     None         Linwood Dibbles, MD 09/30/22 1238

## 2022-09-30 NOTE — Plan of Care (Signed)
  Problem: Fluid Volume: Goal: Hemodynamic stability will improve Outcome: Progressing   Problem: Clinical Measurements: Goal: Diagnostic test results will improve Outcome: Progressing Goal: Signs and symptoms of infection will decrease Outcome: Progressing   Problem: Respiratory: Goal: Ability to maintain adequate ventilation will improve Outcome: Progressing   Problem: Clinical Measurements: Goal: Will remain free from infection Outcome: Progressing Goal: Cardiovascular complication will be avoided Outcome: Progressing   Problem: Activity: Goal: Risk for activity intolerance will decrease Outcome: Progressing   Problem: Coping: Goal: Level of anxiety will decrease Outcome: Progressing   Problem: Pain Managment: Goal: General experience of comfort will improve Outcome: Progressing   Problem: Safety: Goal: Ability to remain free from injury will improve Outcome: Progressing   Problem: Skin Integrity: Goal: Risk for impaired skin integrity will decrease Outcome: Progressing   Problem: Nutrition: Goal: Adequate nutrition will be maintained Outcome: Adequate for Discharge

## 2022-09-30 NOTE — H&P (Signed)
History and Physical    Patient: [name] EXB:[id number] DOB: 01/03/1956 DOA: 09/30/2022 DOS: the patient was seen and examined on 09/30/2022 PCP: [name], MD  Patient coming from: Home  Chief Complaint:  Chief Complaint  Patient presents with   Altered Mental Status   HPI: [name] is a 67 y.o. female with medical history significant of chronic diarrhea, nonalcoholic liver cirrhosis, heart murmur, hypertension, cholelithiasis, class III obesity, pulmonary embolism, short gut syndrome, hypothyroidism who presented to the emergency department due to altered mental status.  Per patient's son, she started acting incoherently this morning but is now closer to baseline.  She usually does these mental changes when she has an UTI or hyperammonemia.  She is currently complaining of back pain but denied any other complaints.  100.1 F, pulse 7016, respirations 32, BP 98/59 mmHg O2 sat 95% on room air.  The patient received 2000 mL of LR bolus, 500 mL of normal saline bolus followed by LR at 150 mL/h and ceftriaxone 2 g IVPB.  Subsequently, the maintenance fluids were held due to concerns of pulmonary edema and resolution of hypotension.  Lab work: Urine analysis was hazy in appearance, with large hemoglobin, protein of 30 mg/dL, greater than 50 RBC, greater than 50 WBC and no bacteria seen.  CBC showed a white count 15.2, hemoglobin 9.6 g/dL platelets 102.  PT 19.1 and INR 1.6.  Ammonia was 63 mol/L.  Lactic acid 3.8 then 3.4 mmol/L.  CMP showed a CO2 of 19 mmol/L with a normal anion gap, the rest of the electrolytes are normal after calcium correction.  Glucose 108, BUN 25, creatinine 1.23 and total bilirubin 2.3 mg/dL.  Total protein 6.4 and albumin 2.6 g/dL.  Alkaline phosphatase 160 findings per liter.  Transaminases were normal.    Imaging: Portable 1 view chest radiograph showed cardiomegaly with pulmonary venous congestion, but no frank pulmonary edema.  Aortic atherosclerosis.   CT head without contrast with no acute intracranial abnormality.   Review of Systems: As mentioned in the history of present illness. All other systems reviewed and are negative. Past Medical History:  Diagnosis Date   Chronic diarrhea    Cirrhosis, 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   pt stated on admission history review   Heart murmur    Hypertension    Kidney stone    Obesity    Pulmonary embolism (HCC)    Short gut syndrome    Thyroid disease    Past Surgical History:  Procedure Laterality Date   CHOLECYSTECTOMY     COLON SURGERY     HERNIA REPAIR     ILEOSTOMY     ILEOSTOMY CLOSURE     IR KYPHO THORACIC WITH BONE BIOPSY  08/31/2022   IR RADIOLOGIST EVAL & MGMT  09/27/2022   KIDNEY STONE SURGERY     WRIST SURGERY     Social History:  reports that she quit smoking about 37 years ago. Her smoking use included cigarettes. She has been exposed to tobacco smoke. She has never used smokeless tobacco. She reports that she does not drink alcohol and does not use drugs.  Allergies  Allergen Reactions   Ace Inhibitors Anaphylaxis and Swelling   Ivp Dye [Iodinated Contrast Media] Anaphylaxis   Desitin [Zinc Oxide] Other (See Comments)    Worsens rash   Chlorhexidine Rash and Other (See Comments)    WORSENS RASHES   Doxycycline Rash   Penicillins Rash   Zofran [Ondansetron] Rash    No family history on file.  Prior to Admission medications   Medication Sig Start Date End Date Taking? Authorizing Provider  acetaminophen (TYLENOL) 325 MG tablet Take 650 mg by mouth every 6 (six) hours as needed for moderate pain or headache.    [provider]  atorvastatin (LIPITOR) 40 MG tablet Take 40 mg by mouth in the morning. 01/19/22   [provider]  ciclopirox (PENLAC) 8 % solution Apply 1 application  topically See admin instructions. Apply over all toenails and surrounding skin at bedtime. Apply daily over previous coat. After seven (7) days, may remove with alcohol and  continue cycle.    [provider]  colchicine 0.6 MG tablet Take 0.6 mg by mouth daily as needed (gout).    [provider]  cyanocobalamin (VITAMIN B12) 1000 MCG tablet Take 2,000 mcg by mouth every Friday.    [provider]  dicyclomine (BENTYL) 10 MG capsule Take 1 capsule (10 mg total) by mouth 3 (three) times daily as needed for spasms (abdominal cramping). 09/09/22   Lewie Chamber, MD  EPINEPHrine 0.3 mg/0.3 mL IJ SOAJ injection Inject 0.3 mg into the muscle as needed for anaphylaxis. 08/07/19   [provider]  folic acid (FOLVITE) 1 MG tablet Take 1 tablet (1 mg total) by mouth daily. 06/16/22   Calvert Cantor, MD  furosemide (LASIX) 40 MG tablet Take 1 tablet (40 mg total) by mouth 2 (two) times daily. 09/09/22   Lewie Chamber, MD  HYDROcodone-acetaminophen (NORCO) 10-325 MG tablet Take 1 tablet by mouth every 6 (six) hours as needed for moderate pain or severe pain. 02/25/22   [provider]  hydrOXYzine (ATARAX/VISTARIL) 25 MG tablet Take 25 mg by mouth 3 (three) times daily as needed for itching.    [provider]  IRON-VITAMIN C PO Take 1 tablet by mouth daily with breakfast.    [provider]  lactulose (CHRONULAC) 10 GM/15ML solution Take 30 mLs (20 g total) by mouth 3 (three) times daily. 09/09/22   Lewie Chamber, MD  levothyroxine (SYNTHROID) 150 MCG tablet Take 150 mcg by mouth daily before breakfast. 10/26/21   [provider]  magnesium gluconate (MAGONATE) 500 MG tablet Take 1,000 mg by mouth daily.    [provider]  methocarbamol (ROBAXIN) 500 MG tablet Take 500 mg by mouth in the morning. 03/04/22   [provider]  midodrine (PROAMATINE) 10 MG tablet Take 1 tablet (10 mg total) by mouth 3 (three) times daily with meals. 09/09/22   Lewie Chamber, MD  naloxone [facility]) nasal spray 4 mg/0.1 mL Place 1 spray into the nose as needed (accidental overdose). 03/25/22   [provider]   nystatin cream (MYCOSTATIN) Apply topically 2 (two) times daily. Patient taking differently: Apply 1 Application topically 2 (two) times daily as needed for dry skin (or irritation). 05/22/22   Leatha Gilding, MD  nystatin powder Apply 1 Application topically 3 (three) times daily as needed (for irritation- affected areas).    [provider]  pantoprazole (PROTONIX) 40 MG tablet Take 1 tablet (40 mg total) by mouth 2 (two) times daily before a meal. 09/09/22   Lewie Chamber, MD  potassium chloride (KLOR-CON) 10 MEQ tablet Take 20 mEq by mouth in the morning and at bedtime.    [provider]  promethazine (PHENERGAN) 12.5 MG tablet Take 12.5 mg by mouth every 6 (six) hours as needed for nausea or vomiting.    [provider]  rifaximin (XIFAXAN) 550 MG TABS tablet Take 550  mg by mouth 2 (two) times daily.    [provider]  rOPINIRole (REQUIP) 0.25 MG tablet Take 1 tablet (0.25 mg total) by mouth at bedtime. 09/09/22   Lewie Chamber, MD  spironolactone (ALDACTONE) 25 MG tablet Take 1 tablet (25 mg total) by mouth daily. 09/10/22   Lewie Chamber, MD  tamsulosin (FLOMAX) 0.4 MG CAPS capsule Take 1 capsule (0.4 mg total) by mouth daily. 09/10/22   Lewie Chamber, MD  triamcinolone cream (KENALOG) 0.5 % Apply to affected area of right breast twice daily as directed. Patient taking differently: Apply 1 Application topically 2 (two) times daily as needed (for rashes- affected areas). 05/10/22   Osvaldo Shipper, MD  venlafaxine XR (EFFEXOR-XR) 75 MG 24 hr capsule Take 75 mg by mouth daily with breakfast. 08/17/21   [provider]  Vitamin D, Ergocalciferol, (DRISDOL) 50000 UNITS CAPS Take 50,000 Units by mouth every Wednesday.    [provider]    Physical Exam: Vitals:   09/30/22 1000 09/30/22 1030 09/30/22 1130 09/30/22 1139  BP: 126/72 120/70 119/79 134/81  Pulse: (!) 108 (!) 106 (!) 108 (!) 106  Resp: 18 (!) 30 (!) 22 20  Temp:   100.3 F  (37.9 C) 98.9 F (37.2 C)  TempSrc:   Axillary   SpO2: 96% 96% 98% 98%  Weight:      Height:       Physical Exam Vitals and nursing note reviewed.  Constitutional:      General: She is awake. She is not in acute distress.    Appearance: Normal appearance. She is morbidly obese. She is ill-appearing. She is not toxic-appearing.  HENT:     Head: Normocephalic.     Nose: No rhinorrhea.     Mouth/Throat:     Mouth: Mucous membranes are dry.  Eyes:     General: Scleral icterus present.     Pupils: Pupils are equal, round, and reactive to light.  Cardiovascular:     Rate and Rhythm: Regular rhythm. Tachycardia present.     Heart sounds: S1 normal and S2 normal.  Pulmonary:     Effort: Pulmonary effort is normal. No tachypnea or accessory muscle usage.     Breath sounds: No wheezing, rhonchi or rales.  Abdominal:     General: Bowel sounds are normal.     Palpations: Abdomen is soft.     Tenderness: There is no abdominal tenderness. There is no right CVA tenderness, left CVA tenderness, guarding or rebound.  Musculoskeletal:     Cervical back: Neck supple.     Right lower leg: No edema.     Left lower leg: No edema.  Skin:    General: Skin is warm and dry.  Neurological:     General: No focal deficit present.     Mental Status: She is alert and oriented to person, place, and time.  Psychiatric:        Mood and Affect: Mood normal.        Behavior: Behavior normal. Behavior is cooperative.     Data Reviewed:  There are no new results to review at this time.  EKG: Vent. rate 116 BPM PR interval 184 ms QRS duration 72 ms QT/QTcB 319/444 ms P-R-T axes 60 2 38 Sinus tachycardia Anterior infarct, old  06/13/2022 TTE IMPRESSIONS:   1. Left ventricular ejection fraction, by estimation, is 60 to 65%. The  left ventricle has normal function. The left ventricle has no regional  wall motion abnormalities. There is  mild asymmetric left ventricular  hypertrophy of the  basal-septal segment.  Left ventricular diastolic parameters are consistent with Grade I  diastolic dysfunction (impaired relaxation).   2. Right ventricular systolic function is normal. The right ventricular  size is normal.   3. The mitral valve is normal in structure. Trivial mitral valve  regurgitation. No evidence of mitral stenosis.   4. The aortic valve is tricuspid. There is moderate calcification of the  aortic valve. Aortic valve regurgitation is not visualized. Moderate  aortic valve stenosis.   EKG: Vent. rate 116 BPM PR interval 184 ms QRS duration 72 ms QT/QTcB 319/444 ms P-R-T axes 60 2 38 Sinus tachycardia Anterior infarct, old  Assessment and Plan: Principal Problem:   Acute metabolic encephalopathy In the setting of:   Sepsis secondary to UTI (HCC) Admit to PCU/inpatient. Hold IV fluids. Resume midodrine. Continue ceftriaxone 2 g IVPB daily. Follow-up blood culture and sensitivity. Follow-up urine culture and sensitivity. Follow-up CBC and chemistry in the morning.  Active Problems:   Cirrhosis due to NASH Continue lactulose and Xifaxan. Switch hydrocodone to oxycodone for chronic pain.    Hyperammonemia (HCC) Continue lactulose twice daily.    Hypotension (chronic) Continue midodrine 10 mg p.o. 3 times daily.    GERD (gastroesophageal reflux disease) Continue pantoprazole 40 mg p.o. daily.    Hypothyroidism Continue levothyroxine 150 mcg p.o. daily.    Iron deficiency anemia Monitor hematocrit and hemoglobin. Transfuse as needed.    Thrombocytopenia (HCC) Secondary to liver cirrhosis. Monitor platelet count.    Advance Care Planning:   Code Status: Do not attempt resuscitation (DNR) PRE-ARREST INTERVENTIONS DESIRED   Consults:   Family Communication:   Severity of Illness: The appropriate patient status for this patient is INPATIENT. Inpatient status is judged to be reasonable and necessary in order to provide the required intensity  of service to ensure the patient's safety. The patient's presenting symptoms, physical exam findings, and initial radiographic and laboratory data in the context of their chronic comorbidities is felt to place them at high risk for further clinical deterioration. Furthermore, it is not anticipated that the patient will be medically stable for discharge from the hospital within 2 midnights of admission.   * I certify that at the point of admission it is my clinical judgment that the patient will require inpatient hospital care spanning beyond 2 midnights from the point of admission due to high intensity of service, high risk for further deterioration and high frequency of surveillance required.*  Author: Bobette Mo, MD 09/30/2022 12:25 PM  For on call review [email or web address].  This document was prepared using Dragon voice recognition software and may contain some unintended transcription errors.

## 2022-09-30 NOTE — Sepsis Progress Note (Signed)
[facility]link will follow per protocol

## 2022-09-30 NOTE — ED Triage Notes (Signed)
Patient arrived via [facility], per EMS reports patient had new onset altered mental status. Son states this usually happens due to high ammonia levels or UTI. Patient able to follow commands, but fails to answer all questions appropriately.

## 2022-10-01 ENCOUNTER — Inpatient Hospital Stay (HOSPITAL_COMMUNITY): Payer: Medicare (Managed Care)

## 2022-10-01 ENCOUNTER — Encounter (HOSPITAL_COMMUNITY): Payer: Self-pay | Admitting: Internal Medicine

## 2022-10-01 DIAGNOSIS — K90829 Short bowel syndrome, unspecified: Secondary | ICD-10-CM

## 2022-10-01 DIAGNOSIS — D696 Thrombocytopenia, unspecified: Secondary | ICD-10-CM

## 2022-10-01 DIAGNOSIS — B965 Pseudomonas (aeruginosa) (mallei) (pseudomallei) as the cause of diseases classified elsewhere: Secondary | ICD-10-CM

## 2022-10-01 DIAGNOSIS — K7460 Unspecified cirrhosis of liver: Secondary | ICD-10-CM | POA: Diagnosis not present

## 2022-10-01 DIAGNOSIS — E7220 Disorder of urea cycle metabolism, unspecified: Secondary | ICD-10-CM

## 2022-10-01 DIAGNOSIS — R7881 Bacteremia: Secondary | ICD-10-CM | POA: Insufficient documentation

## 2022-10-01 DIAGNOSIS — R6520 Severe sepsis without septic shock: Secondary | ICD-10-CM

## 2022-10-01 DIAGNOSIS — D509 Iron deficiency anemia, unspecified: Secondary | ICD-10-CM

## 2022-10-01 DIAGNOSIS — A4150 Gram-negative sepsis, unspecified: Secondary | ICD-10-CM

## 2022-10-01 DIAGNOSIS — G9341 Metabolic encephalopathy: Secondary | ICD-10-CM

## 2022-10-01 DIAGNOSIS — K219 Gastro-esophageal reflux disease without esophagitis: Secondary | ICD-10-CM

## 2022-10-01 DIAGNOSIS — E861 Hypovolemia: Secondary | ICD-10-CM

## 2022-10-01 DIAGNOSIS — A4152 Sepsis due to Pseudomonas: Secondary | ICD-10-CM

## 2022-10-01 DIAGNOSIS — A419 Sepsis, unspecified organism: Secondary | ICD-10-CM | POA: Diagnosis not present

## 2022-10-01 LAB — COMPREHENSIVE METABOLIC PANEL
ALT: 22 U/L (ref 0–44)
AST: 42 U/L — ABNORMAL HIGH (ref 15–41)
Albumin: 2 g/dL — ABNORMAL LOW (ref 3.5–5.0)
Alkaline Phosphatase: 117 U/L (ref 38–126)
Anion gap: 7 (ref 5–15)
BUN: 26 mg/dL — ABNORMAL HIGH (ref 8–23)
CO2: 21 mmol/L — ABNORMAL LOW (ref 22–32)
Calcium: 7.9 mg/dL — ABNORMAL LOW (ref 8.9–10.3)
Chloride: 105 mmol/L (ref 98–111)
Creatinine, Ser: 1.26 mg/dL — ABNORMAL HIGH (ref 0.44–1.00)
GFR, Estimated: 47 mL/min — ABNORMAL LOW (ref >60)
Glucose, Bld: 95 mg/dL (ref 70–99)
Potassium: 3.5 mmol/L (ref 3.5–5.1)
Sodium: 133 mmol/L — ABNORMAL LOW (ref 135–145)
Total Bilirubin: 1.4 mg/dL — ABNORMAL HIGH (ref 0.3–1.2)
Total Protein: 4.9 g/dL — ABNORMAL LOW (ref 6.5–8.1)

## 2022-10-01 LAB — URINE CULTURE: Culture: NO GROWTH

## 2022-10-01 LAB — CBC
HCT: 25.3 % — ABNORMAL LOW (ref 36.0–46.0)
Hemoglobin: 8 g/dL — ABNORMAL LOW (ref 12.0–15.0)
MCH: 30.8 pg (ref 26.0–34.0)
MCHC: 31.6 g/dL (ref 30.0–36.0)
MCV: 97.3 fL (ref 80.0–100.0)
Platelets: 117 10*3/uL — ABNORMAL LOW (ref 150–400)
RBC: 2.6 MIL/uL — ABNORMAL LOW (ref 3.87–5.11)
RDW: 17.3 % — ABNORMAL HIGH (ref 11.5–15.5)
WBC: 12.6 10*3/uL — ABNORMAL HIGH (ref 4.0–10.5)
nRBC: 0 % (ref 0.0–0.2)

## 2022-10-01 LAB — LACTIC ACID, PLASMA: Lactic Acid, Venous: 1.3 mmol/L (ref 0.5–1.9)

## 2022-10-01 MED ORDER — ENOXAPARIN SODIUM 40 MG/0.4ML IJ SOSY
40.0000 mg | PREFILLED_SYRINGE | INTRAMUSCULAR | Status: DC
  Administered 2022-10-01 – 2022-10-03 (×3): 40 mg via SUBCUTANEOUS
  Filled 2022-10-01 (×4): qty 0.4

## 2022-10-01 MED ORDER — SODIUM CHLORIDE (PF) 0.9 % IJ SOLN
INTRAMUSCULAR | Status: AC
  Filled 2022-10-01: qty 150

## 2022-10-01 MED ORDER — IOHEXOL 9 MG/ML PO SOLN
ORAL | Status: AC
  Filled 2022-10-01: qty 1000

## 2022-10-01 MED ORDER — SODIUM CHLORIDE 0.9 % IV SOLN
2.0000 g | Freq: Three times a day (TID) | INTRAVENOUS | Status: DC
  Administered 2022-10-01 – 2022-10-03 (×6): 2 g via INTRAVENOUS
  Filled 2022-10-01 (×6): qty 12.5

## 2022-10-01 MED ORDER — BARIUM SULFATE 2 % PO SUSP
900.0000 mL | Freq: Once | ORAL | Status: AC
  Administered 2022-10-01: 900 mL via ORAL

## 2022-10-01 NOTE — Progress Notes (Signed)
Mobility Specialist - Progress Note   10/01/22 1008  Mobility  Activity Ambulated with assistance in hallway  Level of Assistance Contact guard assist, steadying assist  Assistive Device Front wheel walker  Distance Ambulated (ft) 200 ft  Range of Motion/Exercises Active  Activity Response Tolerated fair  Mobility Referral Yes  $Mobility charge 1 Mobility  Mobility Specialist Start Time (ACUTE ONLY) 0955  Mobility Specialist Stop Time (ACUTE ONLY) 1008  Mobility Specialist Time Calculation (min) (ACUTE ONLY) 13 min   Pt was found in bed and agreeable to ambulate. Pt stated feeling lightheaded when sitting EOB. Had x2 brief standing rest breaks during session having harder work of breathing. Encouraged pursed lip breathing. At EOS returned to bed with all needs met. Call bell in reach and bed alarm on.  Billey Chang Mobility Specialist

## 2022-10-01 NOTE — Plan of Care (Signed)
  Problem: Fluid Volume: Goal: Hemodynamic stability will improve Outcome: Progressing   Problem: Clinical Measurements: Goal: Diagnostic test results will improve Outcome: Progressing Goal: Signs and symptoms of infection will decrease Outcome: Progressing   Problem: Education: Goal: Knowledge of General Education information will improve Description: Including pain rating scale, medication(s)/side effects and non-pharmacologic comfort measures Outcome: Progressing   Problem: Health Behavior/Discharge Planning: Goal: Ability to manage health-related needs will improve Outcome: Progressing   Problem: Clinical Measurements: Goal: Ability to maintain clinical measurements within normal limits will improve Outcome: Progressing Goal: Will remain free from infection Outcome: Progressing Goal: Diagnostic test results will improve Outcome: Progressing   Problem: Activity: Goal: Risk for activity intolerance will decrease Outcome: Progressing   Problem: Elimination: Goal: Will not experience complications related to bowel motility Outcome: Progressing   Problem: Pain Managment: Goal: General experience of comfort will improve Outcome: Progressing   Problem: Safety: Goal: Ability to remain free from injury will improve Outcome: Progressing   Problem: Skin Integrity: Goal: Risk for impaired skin integrity will decrease Outcome: Progressing   Problem: Respiratory: Goal: Ability to maintain adequate ventilation will improve Outcome: Adequate for Discharge   Problem: Nutrition: Goal: Adequate nutrition will be maintained Outcome: Adequate for Discharge   Problem: Coping: Goal: Level of anxiety will decrease Outcome: Adequate for Discharge

## 2022-10-01 NOTE — Progress Notes (Signed)
PROGRESS NOTE  [name] YNW:[id number] DOB: [birth year]   PCP: [name], MD  Patient is from: Home.  Lives with son.  Uses rolling walker and wheelchair at baseline.  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  She had encephalopathy, SIRS, and lactic acidosis to 3.8 meeting criteria for severe sepsis.  UA concerning for hematuria.  CT head without acute finding.  Ammonia elevated to 63.  CXR raise concern for CHF.  Blood and urine cultures ordered.  Started on IV ceftriaxone and lactulose and admitted.  The next day, blood culture with Pseudomonas aeruginosa.  Antibiotics escalated to IV cefepime.  Encephalopathy resolved.  Subjective: Seen and examined earlier this morning.  No major events overnight of this morning.  Reports feeling better this morning.  She says she is more alert and stronger today.  She reports abdominal pain but unchanged from baseline.  She attributes this to abdominal hernia.  She actually denies UTI symptoms.  Denies respiratory symptoms either.  Objective: Vitals:   09/30/22 2026 10/01/22 0050 10/01/22 0434 10/01/22 0842  BP: 117/60 (!) 134/54 114/74 120/67  Pulse: 88 87 86 77  Resp: 18 20 16  (!) 22  Temp: 98.5 F (36.9 C) 98.6 F (37 C) 98 F (36.7 C) 98.4 F (36.9 C)  TempSrc: Oral  Oral Oral  SpO2: 100% 97% 98% 98%  Weight:      Height:        Examination:  GENERAL: No apparent distress.  Nontoxic. HEENT: MMM.  Vision and hearing grossly intact.  NECK: Supple.  No apparent JVD.  RESP:  No IWOB.  Fair aeration bilaterally. CVS:  RRR. Heart sounds  normal.  ABD/GI/GU: BS+. Abd soft.  Diffuse tenderness. MSK/EXT:  Moves extremities. No apparent deformity. No edema.  SKIN: no apparent skin lesion or wound NEURO: Awake, alert and oriented appropriately.  No apparent focal neuro deficit. PSYCH: Calm. Normal affect.   Procedures:  None  Microbiology summarized: COVID-19 PCR nonreactive Blood culture with Pseudomonas aeruginosa Urine culture negative.  Assessment and plan: Principal Problem:   Severe sepsis (HCC) Active Problems:   Acute metabolic encephalopathy   Cirrhosis due to NASH   GERD (gastroesophageal reflux disease)   Hypothyroidism   Iron deficiency anemia   Thrombocytopenia (HCC)   Hypotension   Hyperammonemia (HCC)   Short gut syndrome   Morbid obesity (HCC)   Bacteremia due to Pseudomonas  Severe sepsis due to Pseudomonas bacteremia: POA.  She has leukocytosis, tachycardia, tachypnea, encephalopathy and lactic acidosis on presentation.  Unclear source of bacteremia.  Patient denies UTI symptoms.  UA not convincing for UTI.  Urine culture negative.  She has complex GI history including liver cirrhosis and short gut syndrome with chronic diarrhea.  She has diffuse tenderness to palpation.  She does not seem to have diarrhea currently.  Encephalopathy and lactic acidosis resolved even before appropriate antibiotic. -Change ceftriaxone to IV cefepime pending culture sensitivity -CT abdomen and pelvis without contrast given contrast allergy -Continue holding diuretics  Acute metabolic encephalopathy: Likely due to sepsis and elevated ammonia.  Resolved. -Treat sepsis as above -Continue lactulose -Reorientation and delirium precaution  Nausea cirrhosis:  Stable.  He does not seem to have ascites.  She has abdominal tenderness. -CT abdomen and pelvis as above -Continue Xifaxan and lactulose -Continue holding diuretics -Change diet to low-sodium diet   CKD-3A: Stable Recent Labs    09/02/22 0501 09/03/22 1520  09/04/22 2024 09/05/22 1317 09/06/22 0701 09/08/22 0553 09/09/22 0606 09/30/22 0800 09/30/22 0817 10/01/22 0419  BUN 25* 18 15 13 12 13 14  25* 26* 26*  CREATININE 1.14* 1.01* 1.13* 1.04* 1.12* 1.38* 1.22* 1.23* 1.40* 1.26*  -Continue monitoring    Iron deficiency anemia: Slight drop in Hgb likely dilutional.  No report of bleeding Recent Labs    08/31/22 0128 09/01/22 0523 09/02/22 0501 09/03/22 1520 09/04/22 2024 09/05/22 1317 09/06/22 0701 09/30/22 0800 09/30/22 0817 10/01/22 0419  HGB 8.9* 8.7* 8.6* 9.1* 8.7* 8.8* 8.9* 9.6* 8.8* 8.0*  -Continue monitoring    Thrombocytopenia (HCC): Likely due to liver cirrhosis.  Relatively stable. Recent Labs  Lab 09/30/22 0800 10/01/22 0419  PLT 136* 117*  -Continue monitoring  Physical deconditioning: Reports using rolling walker and wheelchair at baseline. -PT/OT eval  Chronic hypotension: On midodrine at home -Continue home midodrine -Continue holding diuretics   GERD -Continue home PPI   Hypothyroidism -Continue levothyroxine 150 mcg p.o. daily.  History of PE/DVT: No on anticoagulation.  High risk for VTE -Start prophylactic subcu Lovenox  Morbid obesity Body mass index is 58.06 kg/m.           DVT prophylaxis:  enoxaparin (LOVENOX) injection 40 mg Start: 10/01/22 1315 SCDs Start: 09/30/22 1241  Code Status: DNR/DNI Family Communication: None at bedside Level of care: Progressive Status is: Inpatient Remains inpatient appropriate because: Severe sepsis and Pseudomonas bacteremia   Final disposition: Home Consultants:  None  55 minutes with more than 50% spent in reviewing records, counseling patient/family and coordinating care.   Sch Meds:  Scheduled Meds:  cyanocobalamin  2,000 mcg Oral Q Fri   enoxaparin (LOVENOX) injection  40 mg Subcutaneous Q24H   folic acid  1 mg Oral Daily    HYDROmorphone (DILAUDID) injection  1 mg Intravenous Once   lactulose  30 g Oral BID   levothyroxine   150 mcg Oral QAC breakfast   magnesium gluconate  500 mg Oral Daily   midodrine  10 mg Oral TID WC   nystatin cream   Topical BID   pantoprazole  40 mg Oral BID AC   rifaximin  550 mg Oral BID   rOPINIRole  0.25 mg Oral QHS   tamsulosin  0.4 mg Oral Daily   venlafaxine XR  75 mg Oral Q breakfast   Continuous Infusions:  ceFEPime (MAXIPIME) IV 2 g (10/01/22 1142)   PRN Meds:.acetaminophen **OR** acetaminophen, hydrOXYzine, lactulose, oxyCODONE, prochlorperazine  Antimicrobials: Anti-infectives (From admission, onward)    Start     Dose/Rate Route Frequency Ordered Stop   10/01/22 1130  ceFEPIme (MAXIPIME) 2 g in sodium chloride 0.9 % 100 mL IVPB        2 g 200 mL/hr over 30 Minutes Intravenous Every 8 hours 10/01/22 1043     09/30/22 2200  rifaximin (XIFAXAN) tablet 550 mg        550 mg Oral 2 times daily 09/30/22 1805     09/30/22 0830  cefTRIAXone (ROCEPHIN) 2 g in sodium chloride 0.9 % 100 mL IVPB  Status:  Discontinued        2 g 200 mL/hr over 30 Minutes Intravenous Every 24 hours 09/30/22 0823 10/01/22 1043  I have personally reviewed the following labs and images: CBC: Recent Labs  Lab 09/30/22 0800 09/30/22 0817 10/01/22 0419  WBC 15.2*  --  12.6*  NEUTROABS 13.4*  --   --   HGB 9.6* 8.8* 8.0*  HCT 30.5* 26.0* 25.3*  MCV 96.5  --  97.3  PLT 136*  --  117*   BMP &GFR Recent Labs  Lab 09/30/22 0800 09/30/22 0817 10/01/22 0419  NA 136 140 133*  K 3.8 3.9 3.5  CL 105 105 105  CO2 19*  --  21*  GLUCOSE 108* 101* 95  BUN 25* 26* 26*  CREATININE 1.23* 1.40* 1.26*  CALCIUM 8.2*  --  7.9*   Estimated Creatinine Clearance: 61.6 mL/min (A) (by C-G formula based on SCr of 1.26 mg/dL (H)). Liver & Pancreas: Recent Labs  Lab 09/30/22 0800 10/01/22 0419  AST 41 42*  ALT 24 22  ALKPHOS 165* 117  BILITOT 2.3* 1.4*  PROT 6.4* 4.9*  ALBUMIN 2.6* 2.0*   No results for input(s): "LIPASE", "AMYLASE" in the last 168 hours. Recent Labs  Lab  09/30/22 0800  AMMONIA 63*   Diabetic: No results for input(s): "HGBA1C" in the last 72 hours. No results for input(s): "GLUCAP" in the last 168 hours. Cardiac Enzymes: No results for input(s): "CKTOTAL", "CKMB", "CKMBINDEX", "TROPONINI" in the last 168 hours. No results for input(s): "PROBNP" in the last 8760 hours. Coagulation Profile: Recent Labs  Lab 09/30/22 0800  INR 1.6*   Thyroid Function Tests: No results for input(s): "TSH", "T4TOTAL", "FREET4", "T3FREE", "THYROIDAB" in the last 72 hours. Lipid Profile: No results for input(s): "CHOL", "HDL", "LDLCALC", "TRIG", "CHOLHDL", "LDLDIRECT" in the last 72 hours. Anemia Panel: No results for input(s): "VITAMINB12", "FOLATE", "FERRITIN", "TIBC", "IRON", "RETICCTPCT" in the last 72 hours. Urine analysis:    Component Value Date/Time   COLORURINE YELLOW 09/30/2022 1126   APPEARANCEUR HAZY (A) 09/30/2022 1126   LABSPEC 1.012 09/30/2022 1126   PHURINE 5.0 09/30/2022 1126   GLUCOSEU NEGATIVE 09/30/2022 1126   HGBUR LARGE (A) 09/30/2022 1126   BILIRUBINUR NEGATIVE 09/30/2022 1126   KETONESUR NEGATIVE 09/30/2022 1126   PROTEINUR 30 (A) 09/30/2022 1126   UROBILINOGEN 0.2 08/21/2013 0017   NITRITE NEGATIVE 09/30/2022 1126   LEUKOCYTESUR NEGATIVE 09/30/2022 1126   Sepsis Labs: Invalid input(s): "PROCALCITONIN", "LACTICIDVEN"  Microbiology: Recent Results (from the past 240 hour(s))  SARS Coronavirus 2 by RT PCR (hospital order, performed in [facility] hospital lab) *cepheid single result test* Anterior Nasal Swab     Status: None   Collection Time: 09/30/22  7:45 AM   Specimen: Anterior Nasal Swab  Result Value Ref Range Status   SARS Coronavirus 2 by RT PCR NEGATIVE NEGATIVE Final    Comment: (NOTE) SARS-CoV-2 target nucleic acids are NOT DETECTED.  The SARS-CoV-2 RNA is generally detectable in upper and lower respiratory specimens during the acute phase of infection. The lowest concentration of SARS-CoV-2 viral copies  this assay can detect is 250 copies / mL. A negative result does not preclude SARS-CoV-2 infection and should not be used as the sole basis for treatment or other patient management decisions.  A negative result may occur with improper specimen collection / handling, submission of specimen other than nasopharyngeal swab, presence of viral mutation(s) within the areas targeted by this assay, and inadequate number of viral copies (<250 copies / mL). A negative result must be combined with clinical observations, patient history, and epidemiological information.  Fact Sheet for Patients:   RoadLapTop.co.za  Fact Sheet for Healthcare Providers: http://kim-miller.com/  This test is not yet approved or  cleared by the [place] FDA and has been authorized for detection and/or diagnosis of SARS-CoV-2 by FDA under an Emergency Use Authorization (EUA).  This EUA will remain in effect (meaning this test can be used) for the duration of the COVID-19 declaration under Section 564(b)(1) of the Act, 21 U.S.C. section 360bbb-3(b)(1), unless the authorization is terminated or revoked sooner.  Performed at [facility], [address] [place], [place] [address]   Blood culture (routine x 2)     Status: None (Preliminary result)   Collection Time: 09/30/22  9:30 AM   Specimen: BLOOD RIGHT HAND  Result Value Ref Range Status   Specimen Description   Final    BLOOD RIGHT HAND Performed at [facility], [address] [place], [place] [address]    Special Requests   Final    BOTTLES DRAWN AEROBIC AND ANAEROBIC Blood Culture adequate volume Performed at [facility], [address] [place], [place] [address]    Culture  Setup Time   Final    GRAM NEGATIVE RODS AEROBIC BOTTLE ONLY CRITICAL RESULT CALLED TO, READ BACK BY AND VERIFIED WITH: PHARMD Cherylin Mylar on [id number] @1021H  BY SM Performed  at [facility], [address] [place], [place] [address]    Culture GRAM NEGATIVE RODS  Final   Report Status PENDING  Incomplete  Blood Culture ID Panel (Reflexed)     Status: Abnormal   Collection Time: 09/30/22  9:30 AM  Result Value Ref Range Status   Enterococcus faecalis NOT DETECTED NOT DETECTED Final   Enterococcus Faecium NOT DETECTED NOT DETECTED Final   Listeria monocytogenes NOT DETECTED NOT DETECTED Final   Staphylococcus species NOT DETECTED NOT DETECTED Final   Staphylococcus aureus (BCID) NOT DETECTED NOT DETECTED Final   Staphylococcus epidermidis NOT DETECTED NOT DETECTED Final   Staphylococcus lugdunensis NOT DETECTED NOT DETECTED Final   Streptococcus species NOT DETECTED NOT DETECTED Final   Streptococcus agalactiae NOT DETECTED NOT DETECTED Final   Streptococcus pneumoniae NOT DETECTED NOT DETECTED Final   Streptococcus pyogenes NOT DETECTED NOT DETECTED Final   A.calcoaceticus-baumannii NOT DETECTED NOT DETECTED Final   Bacteroides fragilis NOT DETECTED NOT DETECTED Final   Enterobacterales NOT DETECTED NOT DETECTED Final   Enterobacter cloacae complex NOT DETECTED NOT DETECTED Final   Escherichia coli NOT DETECTED NOT DETECTED Final   Klebsiella aerogenes NOT DETECTED NOT DETECTED Final   Klebsiella oxytoca NOT DETECTED NOT DETECTED Final   Klebsiella pneumoniae NOT DETECTED NOT DETECTED Final   Proteus species NOT DETECTED NOT DETECTED Final   Salmonella species NOT DETECTED NOT DETECTED Final   Serratia marcescens NOT DETECTED NOT DETECTED Final   Haemophilus influenzae NOT DETECTED NOT DETECTED Final   Neisseria meningitidis NOT DETECTED NOT DETECTED Final   Pseudomonas aeruginosa (A) NOT DETECTED Final    CRITICAL RESULT CALLED TO, READ BACK BY AND VERIFIED WITH:    Comment: PHARMD S.DAVIS AT 1021 ON 10/01/2022 BY SM.   Stenotrophomonas maltophilia NOT DETECTED NOT DETECTED Final   Candida albicans NOT DETECTED NOT DETECTED Final   Candida  auris NOT DETECTED NOT DETECTED Final   Candida glabrata NOT DETECTED NOT DETECTED Final   Candida krusei NOT DETECTED NOT DETECTED Final   Candida parapsilosis NOT DETECTED NOT DETECTED Final   Candida tropicalis NOT DETECTED NOT DETECTED Final   Cryptococcus neoformans/gattii NOT DETECTED NOT DETECTED Final  CTX-M ESBL NOT DETECTED NOT DETECTED Final   Carbapenem resistance IMP NOT DETECTED NOT DETECTED Final   Carbapenem resistance KPC NOT DETECTED NOT DETECTED Final   Carbapenem resistance NDM NOT DETECTED NOT DETECTED Final   Carbapenem resist OXA 48 LIKE NOT DETECTED NOT DETECTED Final   Carbapenem resistance VIM NOT DETECTED NOT DETECTED Final    Comment: Performed at [facility], [address] [place], [place] [address]  Blood culture (routine x 2)     Status: None (Preliminary result)   Collection Time: 09/30/22  9:49 AM   Specimen: BLOOD  Result Value Ref Range Status   Specimen Description   Final    BLOOD BLOOD LEFT ARM Performed at [facility], [address] [place], [place] [address]    Special Requests   Final    BOTTLES DRAWN AEROBIC AND ANAEROBIC Blood Culture adequate volume Performed at [facility], [address] [place], [place] [address]    Culture  Setup Time   Final    GRAM NEGATIVE RODS AEROBIC BOTTLE ONLY CRITICAL VALUE NOTED.  VALUE IS CONSISTENT WITH PREVIOUSLY REPORTED AND CALLED VALUE. Performed at [facility], [address], [place], [place] [address]    Culture GRAM NEGATIVE RODS  Final   Report Status PENDING  Incomplete  Urine Culture     Status: None   Collection Time: 09/30/22 11:26 AM   Specimen: Urine, Random  Result Value Ref Range Status   Specimen Description   Final    URINE, RANDOM Performed at [facility], [address] [place], [place] [address]    Special Requests   Final    NONE Reflexed from [id number] Performed at [facility],  [address] [place], [place] [address]    Culture   Final    NO GROWTH Performed at [facility], [address], [place], [place] [address]    Report Status 10/01/2022 FINAL  Final    Radiology Studies: No results found.    Toleen Lachapelle T. Kelli Robeck [facility]  If 7PM-7AM, please contact night-coverage [email or web address] 10/01/2022, 12:15 PM

## 2022-10-01 NOTE — Plan of Care (Signed)

## 2022-10-01 NOTE — Progress Notes (Signed)
PHARMACY - PHYSICIAN COMMUNICATION CRITICAL VALUE ALERT - BLOOD CULTURE IDENTIFICATION (BCID)  [name] is an 67 y.o. female who presented to [facility] on 09/30/2022 with a chief complaint of AMS in the setting of sepsis.  Assessment: 2/4 bottles growing gram(-) bacilli, BCID showing pseudomonas aeruginosa with no resistance detected  Name of physician (or Provider) Contacted: Candelaria Stagers, MD  Current antibiotics:  Ceftriaxone 2g daily Rifaximin 550mg  BID  Changes to prescribed antibiotics recommended to MD:  Stop ceftriaxone and start cefepime 2g q8hrs   Results for orders placed or performed during the hospital encounter of 08/11/22  Blood Culture ID Panel (Reflexed) (Collected: 08/11/2022  7:30 AM)  Result Value Ref Range   Enterococcus faecalis NOT DETECTED NOT DETECTED   Enterococcus Faecium NOT DETECTED NOT DETECTED   Listeria monocytogenes NOT DETECTED NOT DETECTED   Staphylococcus species NOT DETECTED NOT DETECTED   Staphylococcus aureus (BCID) NOT DETECTED NOT DETECTED   Staphylococcus epidermidis NOT DETECTED NOT DETECTED   Staphylococcus lugdunensis NOT DETECTED NOT DETECTED   Streptococcus species NOT DETECTED NOT DETECTED   Streptococcus agalactiae NOT DETECTED NOT DETECTED   Streptococcus pneumoniae NOT DETECTED NOT DETECTED   Streptococcus pyogenes NOT DETECTED NOT DETECTED   A.calcoaceticus-baumannii NOT DETECTED NOT DETECTED   Bacteroides fragilis NOT DETECTED NOT DETECTED   Enterobacterales NOT DETECTED NOT DETECTED   Enterobacter cloacae complex NOT DETECTED NOT DETECTED   Escherichia coli NOT DETECTED NOT DETECTED   Klebsiella aerogenes NOT DETECTED NOT DETECTED   Klebsiella oxytoca NOT DETECTED NOT DETECTED   Klebsiella pneumoniae NOT DETECTED NOT DETECTED   Proteus species NOT DETECTED NOT DETECTED   Salmonella species NOT DETECTED NOT DETECTED   Serratia marcescens NOT DETECTED NOT DETECTED   Haemophilus influenzae NOT DETECTED NOT DETECTED    Neisseria meningitidis NOT DETECTED NOT DETECTED   Pseudomonas aeruginosa NOT DETECTED NOT DETECTED   Stenotrophomonas maltophilia NOT DETECTED NOT DETECTED   Candida albicans NOT DETECTED NOT DETECTED   Candida auris NOT DETECTED NOT DETECTED   Candida glabrata NOT DETECTED NOT DETECTED   Candida krusei NOT DETECTED NOT DETECTED   Candida parapsilosis NOT DETECTED NOT DETECTED   Candida tropicalis NOT DETECTED NOT DETECTED   Cryptococcus neoformans/gattii NOT DETECTED NOT DETECTED    Cherylin Mylar, PharmD Clinical Pharmacist  9/21/202410:29 AM

## 2022-10-02 DIAGNOSIS — A419 Sepsis, unspecified organism: Secondary | ICD-10-CM | POA: Diagnosis not present

## 2022-10-02 DIAGNOSIS — D696 Thrombocytopenia, unspecified: Secondary | ICD-10-CM | POA: Diagnosis not present

## 2022-10-02 DIAGNOSIS — K7460 Unspecified cirrhosis of liver: Secondary | ICD-10-CM | POA: Diagnosis not present

## 2022-10-02 DIAGNOSIS — K90829 Short bowel syndrome, unspecified: Secondary | ICD-10-CM | POA: Diagnosis not present

## 2022-10-02 LAB — CBC
HCT: 23.9 % — ABNORMAL LOW (ref 36.0–46.0)
Hemoglobin: 7.4 g/dL — ABNORMAL LOW (ref 12.0–15.0)
MCH: 30.7 pg (ref 26.0–34.0)
MCHC: 31 g/dL (ref 30.0–36.0)
MCV: 99.2 fL (ref 80.0–100.0)
Platelets: 106 10*3/uL — ABNORMAL LOW (ref 150–400)
RBC: 2.41 MIL/uL — ABNORMAL LOW (ref 3.87–5.11)
RDW: 16.9 % — ABNORMAL HIGH (ref 11.5–15.5)
WBC: 6.9 10*3/uL (ref 4.0–10.5)
nRBC: 0 % (ref 0.0–0.2)

## 2022-10-02 LAB — BLOOD CULTURE ID PANEL (REFLEXED) - BCID2
A.calcoaceticus-baumannii: NOT DETECTED
Bacteroides fragilis: NOT DETECTED
CTX-M ESBL: NOT DETECTED
Candida albicans: NOT DETECTED
Candida auris: NOT DETECTED
Candida glabrata: NOT DETECTED
Candida krusei: NOT DETECTED
Candida parapsilosis: NOT DETECTED
Candida tropicalis: NOT DETECTED
Carbapenem resist OXA 48 LIKE: NOT DETECTED
Carbapenem resistance IMP: NOT DETECTED
Carbapenem resistance KPC: NOT DETECTED
Carbapenem resistance NDM: NOT DETECTED
Carbapenem resistance VIM: NOT DETECTED
Cryptococcus neoformans/gattii: NOT DETECTED
Enterobacter cloacae complex: NOT DETECTED
Enterobacterales: NOT DETECTED
Enterococcus Faecium: NOT DETECTED
Enterococcus faecalis: NOT DETECTED
Escherichia coli: NOT DETECTED
Haemophilus influenzae: NOT DETECTED
Klebsiella aerogenes: NOT DETECTED
Klebsiella oxytoca: NOT DETECTED
Klebsiella pneumoniae: NOT DETECTED
Listeria monocytogenes: NOT DETECTED
Neisseria meningitidis: NOT DETECTED
Proteus species: NOT DETECTED
Salmonella species: NOT DETECTED
Serratia marcescens: NOT DETECTED
Staphylococcus aureus (BCID): NOT DETECTED
Staphylococcus epidermidis: NOT DETECTED
Staphylococcus lugdunensis: NOT DETECTED
Staphylococcus species: NOT DETECTED
Stenotrophomonas maltophilia: NOT DETECTED
Streptococcus agalactiae: NOT DETECTED
Streptococcus pneumoniae: NOT DETECTED
Streptococcus pyogenes: NOT DETECTED
Streptococcus species: NOT DETECTED

## 2022-10-02 LAB — COMPREHENSIVE METABOLIC PANEL WITH GFR
ALT: 22 U/L (ref 0–44)
AST: 37 U/L (ref 15–41)
Albumin: 1.9 g/dL — ABNORMAL LOW (ref 3.5–5.0)
Alkaline Phosphatase: 108 U/L (ref 38–126)
Anion gap: 7 (ref 5–15)
BUN: 29 mg/dL — ABNORMAL HIGH (ref 8–23)
CO2: 22 mmol/L (ref 22–32)
Calcium: 7.5 mg/dL — ABNORMAL LOW (ref 8.9–10.3)
Chloride: 105 mmol/L (ref 98–111)
Creatinine, Ser: 1.17 mg/dL — ABNORMAL HIGH (ref 0.44–1.00)
GFR, Estimated: 52 mL/min — ABNORMAL LOW
Glucose, Bld: 124 mg/dL — ABNORMAL HIGH (ref 70–99)
Potassium: 3.2 mmol/L — ABNORMAL LOW (ref 3.5–5.1)
Sodium: 134 mmol/L — ABNORMAL LOW (ref 135–145)
Total Bilirubin: 0.9 mg/dL (ref 0.3–1.2)
Total Protein: 4.8 g/dL — ABNORMAL LOW (ref 6.5–8.1)

## 2022-10-02 LAB — AMMONIA: Ammonia: 48 umol/L — ABNORMAL HIGH (ref 9–35)

## 2022-10-02 LAB — MAGNESIUM: Magnesium: 1.6 mg/dL — ABNORMAL LOW (ref 1.7–2.4)

## 2022-10-02 LAB — PHOSPHORUS: Phosphorus: 3.8 mg/dL (ref 2.5–4.6)

## 2022-10-02 MED ORDER — SPIRONOLACTONE 25 MG PO TABS
25.0000 mg | ORAL_TABLET | Freq: Every day | ORAL | Status: DC
  Administered 2022-10-02 – 2022-10-12 (×11): 25 mg via ORAL
  Filled 2022-10-02 (×11): qty 1

## 2022-10-02 MED ORDER — FUROSEMIDE 40 MG PO TABS
40.0000 mg | ORAL_TABLET | Freq: Two times a day (BID) | ORAL | Status: DC
  Administered 2022-10-02 – 2022-10-12 (×20): 40 mg via ORAL
  Filled 2022-10-02 (×20): qty 1

## 2022-10-02 MED ORDER — MAGNESIUM SULFATE 2 GM/50ML IV SOLN
2.0000 g | Freq: Once | INTRAVENOUS | Status: AC
  Administered 2022-10-02: 2 g via INTRAVENOUS
  Filled 2022-10-02: qty 50

## 2022-10-02 MED ORDER — ATORVASTATIN CALCIUM 40 MG PO TABS
40.0000 mg | ORAL_TABLET | Freq: Every morning | ORAL | Status: DC
  Administered 2022-10-03 – 2022-10-12 (×10): 40 mg via ORAL
  Filled 2022-10-02 (×10): qty 1

## 2022-10-02 MED ORDER — POTASSIUM CHLORIDE CRYS ER 20 MEQ PO TBCR
40.0000 meq | EXTENDED_RELEASE_TABLET | ORAL | Status: AC
  Administered 2022-10-02 (×2): 40 meq via ORAL
  Filled 2022-10-02 (×2): qty 2

## 2022-10-02 NOTE — Evaluation (Signed)
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  Clinical Impression  Patient evaluated by Physical Therapy with no further acute PT needs identified. All education has been completed and the patient has no further questions.  Pt reports her son is typically with her for mobility and can assist as needed at home.  Pt able to ambulate 200 ft with RW around unit.  Pt would benefit from continued mobility with staff during hospitalizations. Pt with recent admission and HHPT recommended at that time.  Would benefit from resuming HHPT if still active. PT is signing off. Thank you for this referral.         If plan is discharge home, recommend the following: Assistance with cooking/housework;Help with stairs or ramp for entrance;A little help with walking and/or transfers;A little help with bathing/dressing/bathroom   Can travel by private vehicle        Equipment Recommendations None recommended by PT  Recommendations for Other Services       Functional Status Assessment Patient has not had a recent decline in their functional status     Precautions / Restrictions Precautions Precautions: Fall Precaution Comments: recent T3, T5 comp fxs Restrictions Weight Bearing Restrictions: No      Mobility  Bed Mobility Overal bed mobility: Needs Assistance Bed Mobility: Supine to Sit     Supine to sit: Supervision, HOB elevated     General bed mobility comments: increased time    Transfers Overall transfer level: Needs assistance Equipment used: Rolling walker (2 wheels) Transfers:  Sit to/from Stand Sit to Stand: Supervision           General transfer comment: supervision for safety, no physical assist required    Ambulation/Gait Ambulation/Gait assistance: Contact guard assist, Supervision Gait Distance (Feet): 200 Feet Assistive device: Rolling walker (2 wheels) Gait Pattern/deviations: Decreased stride length, Step-through pattern       General Gait Details: slow and steady with RW  Stairs            Wheelchair Mobility     Tilt Bed    Modified Rankin (Stroke Patients Only)       Balance           Standing balance support: No upper extremity supported Standing balance-Leahy Scale: Fair Standing balance comment: static fair, uses RW for mobility                             Pertinent Vitals/Pain Pain Assessment Pain Assessment: 0-10 Pain Score: 8  Pain Location: back Pain Descriptors / Indicators: Sore Pain Intervention(s): Monitored during session, Repositioned    Home Living Family/patient expects to be discharged to:: Private residence Living Arrangements: Children Available Help at Discharge: Family;Available 24 hours/day Type of Home: House Home Access: Ramped entrance       Home Layout: One level Home Equipment: Rollator (4 wheels);Rolling Walker (2 wheels);BSC/3in1;Other (comment);Hospital bed;Wheelchair - manual (hoyer lift) Additional Comments: Pt lives with [name] - very supportive, provides assist as needed    Prior Function Prior Level of Function : Independent/Modified Independent  Mobility Comments: son typically present for all mobility       Extremity/Trunk Assessment        Lower Extremity Assessment Lower Extremity Assessment: Overall WFL for tasks assessed RLE Deficits / Details: grossly WFL (some limitiations due to body habitus)       Communication   Communication Communication: No apparent difficulties  Cognition Arousal: Alert Behavior During Therapy:  WFL for tasks assessed/performed Overall Cognitive Status: Within Functional Limits for tasks assessed                                 General Comments: pleasant, joking        General Comments      Exercises     Assessment/Plan    PT Assessment Patient does not need any further PT services  PT Problem List         PT Treatment Interventions      PT Goals (Current goals can be found in the Care Plan section)  Acute Rehab PT Goals PT Goal Formulation: All assessment and education complete, DC therapy    Frequency       Co-evaluation               AM-PAC PT "6 Clicks" Mobility  Outcome Measure Help needed turning from your back to your side while in a flat bed without using bedrails?: None Help needed moving from lying on your back to sitting on the side of a flat bed without using bedrails?: None Help needed moving to and from a bed to a chair (including a wheelchair)?: A Little Help needed standing up from a chair using your arms (e.g., wheelchair or bedside chair)?: A Little Help needed to walk in hospital room?: A Little Help needed climbing 3-5 steps with a railing? : A Little 6 Click Score: 20    End of Session Equipment Utilized During Treatment: Gait belt Activity Tolerance: Patient tolerated treatment well Patient left: in chair;with call bell/phone within reach;with chair alarm set Nurse Communication: Mobility status PT Visit Diagnosis: Difficulty in walking, not elsewhere classified (R26.2)    Time: 6440-3474 PT Time Calculation (min) (ACUTE ONLY): 16 min   Charges:   PT Evaluation $PT Eval Low Complexity: 1 Low   PT General Charges $$ ACUTE PT VISIT: 1 Visit        Thomasene Mohair PT, DPT Physical Therapist Acute Rehabilitation Services Office: [phone]   Janan Halter [place] 10/02/2022, 3:18 PM

## 2022-10-02 NOTE — Plan of Care (Signed)
Problem: Fluid Volume: Goal: Hemodynamic stability will improve Outcome: Progressing   Problem: Clinical Measurements: Goal: Diagnostic test results will improve Outcome: Progressing Goal: Signs and symptoms of infection will decrease Outcome: Progressing   Problem: Respiratory: Goal: Ability to maintain adequate ventilation will improve Outcome: Progressing

## 2022-10-02 NOTE — Plan of Care (Signed)

## 2022-10-02 NOTE — Progress Notes (Addendum)
PROGRESS NOTE  [name] MPN:[id number] DOB: 1955-08-23   PCP: [name], MD  Patient is from: Home.  Lives with son.  Uses rolling walker and wheelchair at baseline.  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  She had encephalopathy, SIRS, and lactic acidosis to 3.8 meeting criteria for severe sepsis.  UA concerning for hematuria.  CT head without acute finding.  Ammonia elevated to 63.  CXR raise concern for CHF.  Blood and urine cultures ordered.  Started on IV ceftriaxone and lactulose and admitted.  The next day, blood culture with Pseudomonas aeruginosa.  Antibiotics escalated to IV cefepime.  Encephalopathy resolved.  CT abdomen and pelvis without acute finding but chronic compression fractures, mesenteric congestion and small ascites.  Subjective: Seen and examined earlier this morning.  No major events overnight of this morning.  Reports feeling better.  Abdominal pain improved.  Denies nausea or vomiting.  Hemoglobin dropped to 7.4.  She reports vaginal spotting for over 10 days.  She has upcoming appointment with gynecology.  Denies blood clots or heavy bleeding.  Objective: Vitals:   10/01/22 1716 10/01/22 2053 10/02/22 0433 10/02/22 1442  BP: 124/70 125/61 116/61 (!) 152/85  Pulse: 83 80 76 95  Resp: 20 20 18 18   Temp: 98.2 F (36.8 C) 98.2 F (36.8 C) 97.9 F (36.6 C) 97.9 F (36.6 C)  TempSrc: Oral Oral Oral Oral  SpO2: 99% 100% 100% 99%  Weight:      Height:        Examination:  GENERAL: No apparent distress.  Nontoxic. HEENT: MMM.  Vision  and hearing grossly intact.  NECK: Supple.  No apparent JVD.  RESP:  No IWOB.  Fair aeration bilaterally. CVS:  RRR. Heart sounds normal.  ABD/GI/GU: BS+. Abd soft.  Diffuse tenderness. MSK/EXT:  Moves extremities. No apparent deformity. No edema.  SKIN: no apparent skin lesion or wound NEURO: Awake, alert and oriented appropriately.  No apparent focal neuro deficit. PSYCH: Calm. Normal affect.   Procedures:  None  Microbiology summarized: COVID-19 PCR nonreactive Blood culture with Pseudomonas aeruginosa Urine culture negative.  Assessment and plan: Principal Problem:   Severe sepsis (HCC) Active Problems:   Acute metabolic encephalopathy   Cirrhosis due to NASH   GERD (gastroesophageal reflux disease)   Hypothyroidism   Iron deficiency anemia   Thrombocytopenia (HCC)   Hypotension   Hyperammonemia (HCC)   Short gut syndrome   Morbid obesity (HCC)   Bacteremia due to Pseudomonas  Severe sepsis due to Pseudomonas bacteremia: POA.  She has leukocytosis, tachycardia, tachypnea, encephalopathy and lactic acidosis on presentation.  Unclear source of bacteremia.  Patient denies UTI symptoms.  UA not convincing for UTI.  Urine culture negative.  She has complex GI history including liver cirrhosis and short gut syndrome with chronic diarrhea.  CT abdomen and pelvis without acute finding.  She has diffuse tenderness to palpation.  She does not seem to have diarrhea currently.  Encephalopathy and lactic acidosis resolved even before appropriate antibiotic. -Continue IV cefepime pending culture sensitivity  Acute metabolic encephalopathy: Likely due to sepsis and elevated ammonia.  Resolved. -Treat sepsis  as above -Continue lactulose -Reorientation and delirium precaution  NASH cirrhosis: Stable.  She has abdominal tenderness. CT abdomen and pelvis without acute finding.  Ascites mild. -Continue Xifaxan and lactulose -Resume home diuretics. -Continue low-sodium diet   CKD-3A:  Stable Recent Labs    09/03/22 1520 09/04/22 2024 09/05/22 1317 09/06/22 0701 09/08/22 0553 09/09/22 0606 09/30/22 0800 09/30/22 0817 10/01/22 0419 10/02/22 0419  BUN 18 15 13 12 13 14  25* 26* 26* 29*  CREATININE 1.01* 1.13* 1.04* 1.12* 1.38* 1.22* 1.23* 1.40* 1.26* 1.17*  -Continue monitoring    Iron deficiency anemia/abnormal uterine bleed: She reports vaginal spotting for the last 10 days.  She says she has upcoming appointment with gynecology in a week.  Denies blood clots or heavy bleeding. Recent Labs    09/01/22 0523 09/02/22 0501 09/03/22 1520 09/04/22 2024 09/05/22 1317 09/06/22 0701 09/30/22 0800 09/30/22 0817 10/01/22 0419 10/02/22 0419  HGB 8.7* 8.6* 9.1* 8.7* 8.8* 8.9* 9.6* 8.8* 8.0* 7.4*  -Resume IV home diuretics -Monitor H&H    Thrombocytopenia (HCC): Likely due to liver cirrhosis.  Relatively stable. Recent Labs  Lab 09/30/22 0800 10/01/22 0419 10/02/22 0419  PLT 136* 117* 106*  -Continue monitoring  Physical deconditioning: Reports using rolling walker and wheelchair at baseline. -PT/OT eval  Chronic hypotension: On midodrine at home.  BP slightly elevated. -Continue home midodrine   GERD -Continue home PPI   Hypothyroidism -Continue levothyroxine 150 mcg p.o. daily.  Hypokalemia/hypomagnesemia -Monitor replenish as appropriate  History of PE/DVT: No on anticoagulation.  High risk for VTE -Start prophylactic subcu Lovenox  Morbid obesity Body mass index is 58.06 kg/m.           DVT prophylaxis:  enoxaparin (LOVENOX) injection 40 mg Start: 10/01/22 1300 SCDs Start: 09/30/22 1241  Code Status: DNR/DNI Family Communication: None at bedside Level of care: Med-Surg Status is: Inpatient Remains inpatient appropriate because: Severe sepsis and Pseudomonas bacteremia   Final disposition: Home Consultants:  None  55 minutes with more than 50% spent in reviewing records, counseling patient/family and coordinating  care.   Sch Meds:  Scheduled Meds:  [START ON 10/03/2022] atorvastatin  40 mg Oral q AM   cyanocobalamin  2,000 mcg Oral Q Fri   enoxaparin (LOVENOX) injection  40 mg Subcutaneous Q24H   folic acid  1 mg Oral Daily   furosemide  40 mg Oral BID    HYDROmorphone (DILAUDID) injection  1 mg Intravenous Once   lactulose  30 g Oral BID   levothyroxine  150 mcg Oral QAC breakfast   magnesium gluconate  500 mg Oral Daily   midodrine  10 mg Oral TID WC   nystatin cream   Topical BID   pantoprazole  40 mg Oral BID AC   rifaximin  550 mg Oral BID   rOPINIRole  0.25 mg Oral QHS   spironolactone  25 mg Oral Daily   tamsulosin  0.4 mg Oral Daily   venlafaxine XR  75 mg Oral Q breakfast   Continuous Infusions:  ceFEPime (MAXIPIME) IV 2 g (10/02/22 1135)   PRN Meds:.acetaminophen **OR** acetaminophen, hydrOXYzine, lactulose, oxyCODONE, prochlorperazine  Antimicrobials: Anti-infectives (From admission, onward)    Start     Dose/Rate Route Frequency Ordered Stop   10/01/22 1130  ceFEPIme (MAXIPIME) 2 g in sodium chloride 0.9 % 100 mL IVPB        2 g 200 mL/hr over 30 Minutes Intravenous Every 8 hours 10/01/22 1043     09/30/22 2200  rifaximin (XIFAXAN) tablet  550 mg        550 mg Oral 2 times daily 09/30/22 1805     09/30/22 0830  cefTRIAXone (ROCEPHIN) 2 g in sodium chloride 0.9 % 100 mL IVPB  Status:  Discontinued        2 g 200 mL/hr over 30 Minutes Intravenous Every 24 hours 09/30/22 0823 10/01/22 1043        I have personally reviewed the following labs and images: CBC: Recent Labs  Lab 09/30/22 0800 09/30/22 0817 10/01/22 0419 10/02/22 0419  WBC 15.2*  --  12.6* 6.9  NEUTROABS 13.4*  --   --   --   HGB 9.6* 8.8* 8.0* 7.4*  HCT 30.5* 26.0* 25.3* 23.9*  MCV 96.5  --  97.3 99.2  PLT 136*  --  117* 106*   BMP &GFR Recent Labs  Lab 09/30/22 0800 09/30/22 0817 10/01/22 0419 10/02/22 0419  NA 136 140 133* 134*  K 3.8 3.9 3.5 3.2*  CL 105 105 105 105  CO2 19*  --  21*  22  GLUCOSE 108* 101* 95 124*  BUN 25* 26* 26* 29*  CREATININE 1.23* 1.40* 1.26* 1.17*  CALCIUM 8.2*  --  7.9* 7.5*  MG  --   --   --  1.6*  PHOS  --   --   --  3.8   Estimated Creatinine Clearance: 66.4 mL/min (A) (by C-G formula based on SCr of 1.17 mg/dL (H)). Liver & Pancreas: Recent Labs  Lab 09/30/22 0800 10/01/22 0419 10/02/22 0419  AST 41 42* 37  ALT 24 22 22   ALKPHOS 165* 117 108  BILITOT 2.3* 1.4* 0.9  PROT 6.4* 4.9* 4.8*  ALBUMIN 2.6* 2.0* 1.9*   No results for input(s): "LIPASE", "AMYLASE" in the last 168 hours. Recent Labs  Lab 09/30/22 0800 10/02/22 0419  AMMONIA 63* 48*   Diabetic: No results for input(s): "HGBA1C" in the last 72 hours. No results for input(s): "GLUCAP" in the last 168 hours. Cardiac Enzymes: No results for input(s): "CKTOTAL", "CKMB", "CKMBINDEX", "TROPONINI" in the last 168 hours. No results for input(s): "PROBNP" in the last 8760 hours. Coagulation Profile: Recent Labs  Lab 09/30/22 0800  INR 1.6*   Thyroid Function Tests: No results for input(s): "TSH", "T4TOTAL", "FREET4", "T3FREE", "THYROIDAB" in the last 72 hours. Lipid Profile: No results for input(s): "CHOL", "HDL", "LDLCALC", "TRIG", "CHOLHDL", "LDLDIRECT" in the last 72 hours. Anemia Panel: No results for input(s): "VITAMINB12", "FOLATE", "FERRITIN", "TIBC", "IRON", "RETICCTPCT" in the last 72 hours. Urine analysis:    Component Value Date/Time   COLORURINE YELLOW 09/30/2022 1126   APPEARANCEUR HAZY (A) 09/30/2022 1126   LABSPEC 1.012 09/30/2022 1126   PHURINE 5.0 09/30/2022 1126   GLUCOSEU NEGATIVE 09/30/2022 1126   HGBUR LARGE (A) 09/30/2022 1126   BILIRUBINUR NEGATIVE 09/30/2022 1126   KETONESUR NEGATIVE 09/30/2022 1126   PROTEINUR 30 (A) 09/30/2022 1126   UROBILINOGEN 0.2 08/21/2013 0017   NITRITE NEGATIVE 09/30/2022 1126   LEUKOCYTESUR NEGATIVE 09/30/2022 1126   Sepsis Labs: Invalid input(s): "PROCALCITONIN", "LACTICIDVEN"  Microbiology: Recent Results  (from the past 240 hour(s))  SARS Coronavirus 2 by RT PCR (hospital order, performed in [facility] hospital lab) *cepheid single result test* Anterior Nasal Swab     Status: None   Collection Time: 09/30/22  7:45 AM   Specimen: Anterior Nasal Swab  Result Value Ref Range Status   SARS Coronavirus 2 by RT PCR NEGATIVE NEGATIVE Final    Comment: (NOTE) SARS-CoV-2 target nucleic acids are  NOT DETECTED.  The SARS-CoV-2 RNA is generally detectable in upper and lower respiratory specimens during the acute phase of infection. The lowest concentration of SARS-CoV-2 viral copies this assay can detect is 250 copies / mL. A negative result does not preclude SARS-CoV-2 infection and should not be used as the sole basis for treatment or other patient management decisions.  A negative result may occur with improper specimen collection / handling, submission of specimen other than nasopharyngeal swab, presence of viral mutation(s) within the areas targeted by this assay, and inadequate number of viral copies (<250 copies / mL). A negative result must be combined with clinical observations, patient history, and epidemiological information.  Fact Sheet for Patients:   RoadLapTop.co.za  Fact Sheet for Healthcare Providers: http://kim-miller.com/  This test is not yet approved or  cleared by the [place] FDA and has been authorized for detection and/or diagnosis of SARS-CoV-2 by FDA under an Emergency Use Authorization (EUA).  This EUA will remain in effect (meaning this test can be used) for the duration of the COVID-19 declaration under Section 564(b)(1) of the Act, 21 U.S.C. section 360bbb-3(b)(1), unless the authorization is terminated or revoked sooner.  Performed at [facility], [address], [place], [place] [address]   Blood culture (routine x 2)     Status: Abnormal (Preliminary result)   Collection Time: 09/30/22   9:30 AM   Specimen: BLOOD RIGHT HAND  Result Value Ref Range Status   Specimen Description   Final    BLOOD RIGHT HAND Performed at [facility], [address], [place], [place] [address]    Special Requests   Final    BOTTLES DRAWN AEROBIC AND ANAEROBIC Blood Culture adequate volume Performed at [facility], [address] [place], [place] [address]    Culture  Setup Time   Final    GRAM NEGATIVE RODS AEROBIC BOTTLE ONLY CRITICAL RESULT CALLED TO, READ BACK BY AND VERIFIED WITH: PHARMD Cherylin Mylar on [id number] @1021H  BY SM    Culture (A)  Final    PSEUDOMONAS AERUGINOSA SUSCEPTIBILITIES TO FOLLOW Performed at [facility], [address] [place], [place] [address]    Report Status PENDING  Incomplete  Blood Culture ID Panel (Reflexed)     Status: Abnormal   Collection Time: 09/30/22  9:30 AM  Result Value Ref Range Status   Enterococcus faecalis NOT DETECTED NOT DETECTED Final   Enterococcus Faecium NOT DETECTED NOT DETECTED Final   Listeria monocytogenes NOT DETECTED NOT DETECTED Final   Staphylococcus species NOT DETECTED NOT DETECTED Final   Staphylococcus aureus (BCID) NOT DETECTED NOT DETECTED Final   Staphylococcus epidermidis NOT DETECTED NOT DETECTED Final   Staphylococcus lugdunensis NOT DETECTED NOT DETECTED Final   Streptococcus species NOT DETECTED NOT DETECTED Final   Streptococcus agalactiae NOT DETECTED NOT DETECTED Final   Streptococcus pneumoniae NOT DETECTED NOT DETECTED Final   Streptococcus pyogenes NOT DETECTED NOT DETECTED Final   A.calcoaceticus-baumannii NOT DETECTED NOT DETECTED Final   Bacteroides fragilis NOT DETECTED NOT DETECTED Final   Enterobacterales NOT DETECTED NOT DETECTED Final   Enterobacter cloacae complex NOT DETECTED NOT DETECTED Final   Escherichia coli NOT DETECTED NOT DETECTED Final   Klebsiella aerogenes NOT DETECTED NOT DETECTED Final   Klebsiella oxytoca NOT DETECTED NOT DETECTED  Final   Klebsiella pneumoniae NOT DETECTED NOT DETECTED Final   Proteus species NOT DETECTED NOT DETECTED Final   Salmonella species NOT DETECTED NOT DETECTED Final  Serratia marcescens NOT DETECTED NOT DETECTED Final   Haemophilus influenzae NOT DETECTED NOT DETECTED Final   Neisseria meningitidis NOT DETECTED NOT DETECTED Final   Pseudomonas aeruginosa (A) NOT DETECTED Final    CRITICAL RESULT CALLED TO, READ BACK BY AND VERIFIED WITH:    Comment: PHARMD S.DAVIS AT 1021 ON 10/01/2022 BY SM. DETECTED    Stenotrophomonas maltophilia NOT DETECTED NOT DETECTED Final   Candida albicans NOT DETECTED NOT DETECTED Final   Candida auris NOT DETECTED NOT DETECTED Final   Candida glabrata NOT DETECTED NOT DETECTED Final   Candida krusei NOT DETECTED NOT DETECTED Final   Candida parapsilosis NOT DETECTED NOT DETECTED Final   Candida tropicalis NOT DETECTED NOT DETECTED Final   Cryptococcus neoformans/gattii NOT DETECTED NOT DETECTED Final   CTX-M ESBL NOT DETECTED NOT DETECTED Final   Carbapenem resistance IMP NOT DETECTED NOT DETECTED Final   Carbapenem resistance KPC NOT DETECTED NOT DETECTED Final   Carbapenem resistance NDM NOT DETECTED NOT DETECTED Final   Carbapenem resist OXA 48 LIKE NOT DETECTED NOT DETECTED Final   Carbapenem resistance VIM NOT DETECTED NOT DETECTED Final    Comment: Performed at [name], [address], [place], [place] [address]  Blood culture (routine x 2)     Status: Abnormal (Preliminary result)   Collection Time: 09/30/22  9:49 AM   Specimen: BLOOD LEFT ARM  Result Value Ref Range Status   Specimen Description   Final    BLOOD LEFT ARM Performed at [facility], [address] [place], [place] [address]    Special Requests   Final    BOTTLES DRAWN AEROBIC AND ANAEROBIC Blood Culture adequate volume Performed at [facility], [address], [place], [place] [address]    Culture  Setup Time   Final    GRAM NEGATIVE  RODS AEROBIC BOTTLE ONLY CRITICAL VALUE NOTED.  VALUE IS CONSISTENT WITH PREVIOUSLY REPORTED AND CALLED VALUE. Performed at [facility], [address], [place], [place] [address]    Culture PSEUDOMONAS AERUGINOSA (A)  Final   Report Status PENDING  Incomplete  Urine Culture     Status: None   Collection Time: 09/30/22 11:26 AM   Specimen: Urine, Random  Result Value Ref Range Status   Specimen Description   Final    URINE, RANDOM Performed at [facility], [address] [place], [place] [address]    Special Requests   Final    NONE Reflexed from [id number] Performed at [name], [address] [place], [place] [address]    Culture   Final    NO GROWTH Performed at [facility], [address] [place], [place] [address]    Report Status 10/01/2022 FINAL  Final    Radiology Studies: CT ABDOMEN PELVIS WO CONTRAST  Result Date: 10/01/2022 CLINICAL DATA:  Sepsis workup. EXAM: CT ABDOMEN AND PELVIS WITHOUT CONTRAST (ORAL CONTRAST ONLY) TECHNIQUE: Multidetector CT imaging of the abdomen and pelvis was performed following the standard protocol without IV contrast following oral contrast only. RADIATION DOSE REDUCTION: This exam was performed according to the departmental dose-optimization program which includes automated exposure control, adjustment of the mA and/or kV according to patient size and/or use of iterative reconstruction technique. COMPARISON:  Large number of prior CTs dating back to 2009. The 3 most recent are all without contrast and dated 09/04/2022, 08/25/2022 and 08/11/2022. FINDINGS: Lower chest: Small layering left and trace right layering pleural  effusions are again noted and unchanged. There is linear atelectasis in the lung bases but no infiltrate. Mild-to-moderate cardiomegaly. Coronary arteries and the mitral ring are heavily calcified. The cardiac blood pool is less dense than the myocardium which may be seen with  anemia. No substantial pericardial effusion. Hepatobiliary: Liver again demonstrating capsular nodular changes consistent with cirrhosis. Loss of fine detail again seen due to body habitus causing beam hardening, but no focal masses seen through the beam hardening. The gallbladder is absent, without biliary dilatation. Pancreas: Partially atrophic but otherwise unremarkable without contrast. Spleen: Mildly prominent, 14.2 cm AP. No focal abnormality is seen through the beam hardening. Adrenals/Urinary Tract: No adrenal mass. No contour deforming mass of either kidney. 2.3 cm Bosniak 1 cyst again noted anterior limb of the left kidney, Hounsfield density is 8.3. No follow-up imaging recommended. 3 mm caliceal stone again is noted in the upper pole right kidney. No further nephrolithiasis. No obstructing stone or hydronephrosis. Unremarkable bladder for the degree of distention. Stomach/Bowel: Right hemicolectomy to the hepatic flexure. Patent surgical anastomosis. Interval increased distention of the stomach with food substrate and contrast. No significant gastric fold thickening. There are mild thickened folds in the small bowel in the left hemiabdomen, probably congestive or due to hepatic dysfunction, enteritis also possible but this has been seen previously. The wall of the large bowel is normal in thickness. Scattered uncomplicated sigmoid diverticulosis. Vascular/Lymphatic: Aortic and visceral branch vessel atherosclerosis. Patchy iliac atherosclerosis. No AAA. No adenopathy. Reproductive: Enlarged fibroid uterus with multiple calcified fibroids. No adnexal mass. No interval change in appearance. Other: Small left lower anterior pelvic wall hernia containing fluid and fat, unchanged. Rectus diastasis above the umbilicus is again noted with outward protrusion but no incarcerated hernia. Moderate body wall anasarca continues to be seen. Mild free ascites is also again noted but somewhat improved since 09/04/2022.  Diffuse mesenteric congestive features are chronically noted. Underlying peritonitis not strictly excluded but this seems unchanged. Musculoskeletal: Moderate anterior wedge compression fracture of the T12 vertebral body, first seen on 09/04/2022, has undergone further fragmentation anteriorly and increased anterior wedge height loss, with slight posterosuperior cortical retropulsion. Anterior height loss now is 60%, posterior height loss 30%, with increased vertebral body sclerosis. Other regional vertebra are normal in heights with osteopenia and advanced degenerative changes of the spine. No other significant osseous findings. Mild dextroscoliosis from the lower thoracic to lumbar region. IMPRESSION: 1. Cardiomegaly, small left and trace right pleural effusions. No acute lower chest findings. 2. Likely anemia. 3. Cirrhotic liver with mild splenomegaly and mild ascites. 4. Diffuse mesenteric congestive features are chronically noted. Underlying peritonitis not strictly excluded but this has been seen previously. 5. Thickened small bowel folds left hemiabdomen, probably congestive or due to hepatic dysfunction, enteritis also possible. 6. Moderate anterior wedge compression fracture of the T12 vertebral body, first seen on 09/04/2022, has undergone further fragmentation anteriorly and increased anterior wedge height loss, with slight posterosuperior cortical retropulsion. 7. Aortic and heavy coronary artery atherosclerosis. 8. Enlarged fibroid uterus with calcified fibroids. 9. Small left lower anterior pelvic wall hernia containing fluid and fat, unchanged. Aortic Atherosclerosis (ICD10-I70.0). Electronically Signed   By: Almira Bar M.D.   On: 10/01/2022 22:55      Maxten Shuler T. Ezma Rehm [facility]  If 7PM-7AM, please contact night-coverage [email or web address] 10/02/2022, 3:45 PM

## 2022-10-03 DIAGNOSIS — K7460 Unspecified cirrhosis of liver: Secondary | ICD-10-CM | POA: Diagnosis not present

## 2022-10-03 DIAGNOSIS — D696 Thrombocytopenia, unspecified: Secondary | ICD-10-CM | POA: Diagnosis not present

## 2022-10-03 DIAGNOSIS — R6520 Severe sepsis without septic shock: Secondary | ICD-10-CM | POA: Diagnosis not present

## 2022-10-03 DIAGNOSIS — K90829 Short bowel syndrome, unspecified: Secondary | ICD-10-CM | POA: Diagnosis not present

## 2022-10-03 DIAGNOSIS — A419 Sepsis, unspecified organism: Secondary | ICD-10-CM | POA: Diagnosis not present

## 2022-10-03 DIAGNOSIS — B965 Pseudomonas (aeruginosa) (mallei) (pseudomallei) as the cause of diseases classified elsewhere: Secondary | ICD-10-CM | POA: Diagnosis not present

## 2022-10-03 LAB — CULTURE, BLOOD (ROUTINE X 2)
Special Requests: ADEQUATE
Special Requests: ADEQUATE

## 2022-10-03 LAB — CBC
HCT: 26.8 % — ABNORMAL LOW (ref 36.0–46.0)
Hemoglobin: 8 g/dL — ABNORMAL LOW (ref 12.0–15.0)
MCH: 30 pg (ref 26.0–34.0)
MCHC: 29.9 g/dL — ABNORMAL LOW (ref 30.0–36.0)
MCV: 100.4 fL — ABNORMAL HIGH (ref 80.0–100.0)
Platelets: 123 10*3/uL — ABNORMAL LOW (ref 150–400)
RBC: 2.67 MIL/uL — ABNORMAL LOW (ref 3.87–5.11)
RDW: 16.7 % — ABNORMAL HIGH (ref 11.5–15.5)
WBC: 6.2 10*3/uL (ref 4.0–10.5)
nRBC: 0 % (ref 0.0–0.2)

## 2022-10-03 LAB — RENAL FUNCTION PANEL
Albumin: 2.2 g/dL — ABNORMAL LOW (ref 3.5–5.0)
Anion gap: 6 (ref 5–15)
BUN: 28 mg/dL — ABNORMAL HIGH (ref 8–23)
CO2: 20 mmol/L — ABNORMAL LOW (ref 22–32)
Calcium: 7.9 mg/dL — ABNORMAL LOW (ref 8.9–10.3)
Chloride: 109 mmol/L (ref 98–111)
Creatinine, Ser: 1.06 mg/dL — ABNORMAL HIGH (ref 0.44–1.00)
GFR, Estimated: 58 mL/min — ABNORMAL LOW (ref >60)
Glucose, Bld: 123 mg/dL — ABNORMAL HIGH (ref 70–99)
Phosphorus: 3.7 mg/dL (ref 2.5–4.6)
Potassium: 3.5 mmol/L (ref 3.5–5.1)
Sodium: 135 mmol/L (ref 135–145)

## 2022-10-03 LAB — MAGNESIUM: Magnesium: 2.1 mg/dL (ref 1.7–2.4)

## 2022-10-03 MED ORDER — SODIUM CHLORIDE 0.9 % IV SOLN
1.0000 g | Freq: Three times a day (TID) | INTRAVENOUS | Status: AC
  Administered 2022-10-03 – 2022-10-11 (×24): 1 g via INTRAVENOUS
  Filled 2022-10-03 (×24): qty 20

## 2022-10-03 NOTE — Consult Note (Incomplete)
[facility]    Date of Admission:  09/30/2022   Total days of inpatient antibiotics 3        Reason for Consult: PsA bactremia    Principal Problem:   Severe sepsis (HCC) Active Problems:   Cirrhosis due to NASH   Iron deficiency anemia   GERD (gastroesophageal reflux disease)   Thrombocytopenia (HCC)   Hypothyroidism   Acute metabolic encephalopathy   Hypotension   Hyperammonemia (HCC)   Short gut syndrome   Morbid obesity (HCC)   Bacteremia due to Pseudomonas   Assessment: 67 year old female with history of alcoholic cirrhosis with portal hypertension and ascites, history of hepatic encephalopathy, recent admission for levelFlavobacterium odoratum bacteremia secondary to left lower extremity cellulitis treated with meropenem x 2 weeks EOT 8/17, history of stray cat bite at home with concern for cat scratch on admission treated with azithromycin admitted with altered mental status found to have: #MDR Pseudomonas bacteremia - Currently on cefepime - Afebrile without leukocytosis at that point - To be secondary to UTI.  She has some back pain admission leukocytosis 15k, temp of 100.7.  UA negative nitrites negative leukocytes negative bacteria.  CT abdomen pelvis did not show bladder wall thickening.  As such I do not think that is the etiology of her bacteremia.  Recommendations:  -Start meropenem to complete 10 days - Etiology is unclear but I suspect 2/2 finger wound pt incurred about a week ago, now scabbed over with some surrounding erythema. About a week ago pt was wearing a ring and it gto caught in con's truck leading to wound. Which son states was cleaned immediately.  -ID will sign off  OPAT ORDERS:  Diagnosis: PsA bacteremia  Culture Result: 9/20 2/2 Pseudomonas aeruginosa  Allergies  Allergen Reactions   Ace Inhibitors Anaphylaxis and Swelling   Ivp Dye [Iodinated Contrast Media] Anaphylaxis   Desitin [Zinc Oxide] Other  (See Comments)    Worsens rash   Chlorhexidine Rash and Other (See Comments)    WORSENS RASHES   Doxycycline Rash   Penicillins Rash   Zofran [Ondansetron] Rash     Discharge antibiotics to be given via PICC line:  Per pharmacy protocol merrem 1gm q8h    Duration: 10 days End Date: 10/2  [facility] Per Protocol with [employer] Use: Home health RN for IV administration and teaching, line care and labs.    Labs weekly while on IV antibiotics: _x_ CBC with differential __ BMP **TWICE WEEKLY ON VANCOMYCIN  _x_ CMP __ CRP __ ESR __ Vancomycin trough TWICE WEEKLY __ CK  __ Please pull PIC at completion of IV antibiotics __ Please leave PIC in place until doctor has seen patient or been notified  Fax weekly labs to [phone]  Clinic Follow Up Appt: 10/3  @ [facility] with Marcos Eke  Microbiology:   Antibiotics: Ceftriaxone 9/20 - 9/21 Cefepime 9/21-present Rifaximin  Cultures: Blood 9/20 2/2 Pseudomonas aeruginosa Urine 9/20 no growth    HPI: [name] is a 67 y.o. female history of alcoholic cirrhosis with portal hypertension and ascites, hepatic encephalopathy, obesity, PE/DVT, thyroid disease, chronic diarrhea secondary to sarcoid syndromeWith prior ex lap and 2009, left eye blindness, MDR E. coli, recent hospitalization for Flavobacterium odoratum bacteremia secondary to left lower extremity cellulitis treated with meropenem x 2 weeks EOT 8/17, history of stray cat bite at home with concern for cat scratch on admission treated with azithromycin with acute metabolic encephalopathy, sepsis  secondary to UTI.  She had presented to the ED due to altered mental status, complaint of back pain.  Temp on admission 100.4, WBC 15K.  UA showed negative nitrites negative leukocytes no bacteria seen, urine cultures no growth.  CT abdomen pelvis did not show bladder wall thickening.  She was started on ceftriaxone, blood cultures returned positive for Pseudomonas MDR.  ID  engaged.   Review of Systems: Review of Systems  All other systems reviewed and are negative.   Past Medical History:  Diagnosis Date   Chronic diarrhea    Cirrhosis, 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   pt stated on admission history review   Heart murmur    Hypertension    Kidney stone    Obesity    Pulmonary embolism (HCC)    Short gut syndrome    Thyroid disease     Social History   Tobacco Use   Smoking status: Former    Current packs/day: 0.00    Types: Cigarettes    Quit date: 1987    Years since quitting: 37.7    Passive exposure: Past   Smokeless tobacco: Never  Vaping Use   Vaping status: Never Used  Substance Use Topics   Alcohol use: No   Drug use: No    No family history on file. Scheduled Meds:  atorvastatin  40 mg Oral q AM   cyanocobalamin  2,000 mcg Oral Q Fri   enoxaparin (LOVENOX) injection  40 mg Subcutaneous Q24H   folic acid  1 mg Oral Daily   furosemide  40 mg Oral BID    HYDROmorphone (DILAUDID) injection  1 mg Intravenous Once   lactulose  30 g Oral BID   levothyroxine  150 mcg Oral QAC breakfast   magnesium gluconate  500 mg Oral Daily   midodrine  10 mg Oral TID WC   nystatin cream   Topical BID   pantoprazole  40 mg Oral BID AC   rifaximin  550 mg Oral BID   rOPINIRole  0.25 mg Oral QHS   spironolactone  25 mg Oral Daily   tamsulosin  0.4 mg Oral Daily   venlafaxine XR  75 mg Oral Q breakfast   Continuous Infusions:  meropenem (MERREM) IV 1 g (10/03/22 1119)   PRN Meds:.acetaminophen **OR** acetaminophen, hydrOXYzine, lactulose, oxyCODONE, prochlorperazine Allergies  Allergen Reactions   Ace Inhibitors Anaphylaxis and Swelling   Ivp Dye [Iodinated Contrast Media] Anaphylaxis   Desitin [Zinc Oxide] Other (See Comments)    Worsens rash   Chlorhexidine Rash and Other (See Comments)    WORSENS RASHES   Doxycycline Rash   Penicillins Rash   Zofran [Ondansetron] Rash    OBJECTIVE: Blood pressure 132/74, pulse 76,  temperature 98.2 F (36.8 C), temperature source Oral, resp. rate 18, height 5\' 2"  (1.575 m), weight 99.1 kg, SpO2 99%.  Physical Exam Constitutional:      Appearance: Normal appearance.  HENT:     Head: Normocephalic and atraumatic.     Right Ear: Tympanic membrane normal.     Left Ear: Tympanic membrane normal.     Nose: Nose normal.     Mouth/Throat:     Mouth: Mucous membranes are moist.  Eyes:     Extraocular Movements: Extraocular movements intact.     Conjunctiva/sclera: Conjunctivae normal.     Pupils: Pupils are equal, round, and reactive to light.  Cardiovascular:     Rate and Rhythm: Normal rate and regular rhythm.     Heart sounds: No  murmur heard.    No friction rub. No gallop.  Pulmonary:     Effort: Pulmonary effort is normal.     Breath sounds: Normal breath sounds.  Abdominal:     General: Abdomen is flat.     Palpations: Abdomen is soft.  Skin:    General: Skin is warm and dry.  Neurological:     General: No focal deficit present.     Mental Status: She is alert and oriented to person, place, and time.  Psychiatric:        Mood and Affect: Mood normal.     Lab Results Lab Results  Component Value Date   WBC 6.2 10/03/2022   HGB 8.0 (L) 10/03/2022   HCT 26.8 (L) 10/03/2022   MCV 100.4 (H) 10/03/2022   PLT 123 (L) 10/03/2022    Lab Results  Component Value Date   CREATININE 1.06 (H) 10/03/2022   BUN 28 (H) 10/03/2022   NA 135 10/03/2022   K 3.5 10/03/2022   CL 109 10/03/2022   CO2 20 (L) 10/03/2022    Lab Results  Component Value Date   ALT 22 10/02/2022   AST 37 10/02/2022   ALKPHOS 108 10/02/2022   BILITOT 0.9 10/02/2022       Danelle Earthly, MD [facility] 10/03/2022, 2:31 PM   I have personally spent 87 minutes involved in face-to-face and non-face-to-face activities for this patient on the day of the visit. Professional time spent includes the following activities: Preparing  to see the patient (review of tests), Obtaining and/or reviewing separately obtained history (admission/discharge record), Performing a medically appropriate examination and/or evaluation , Ordering medications/tests/procedures, referring and communicating with other health care professionals, Documenting clinical information in the EMR, Independently interpreting results (not separately reported), Communicating results to the patient/family/caregiver, Counseling and educating the patient/family/caregiver and Care coordination (not separately reported).

## 2022-10-03 NOTE — Evaluation (Addendum)
Occupational Therapy Evaluation Patient Details Name: [name] MRN: [id number] DOB: Sep 02, 1955 Today's Date: 10/03/2022   History of Present Illness 67 year old female with PMH of NASH cirrhosis, chronic diarrhea, short gut syndrome, PE/DVT not on AC, morbid obesity, HTN, systolic murmur, hypothyroidism and chronic hypotension presenting with altered mental status and admitted with working diagnosis of sepsis due to urinary tract infection.   Clinical Impression   Pt presents with decline in function and safety with ADLs and ADL mobility with impaired strength and endurance. PTA pt lives at home with her son and reports that she was Ind with ADLs and used RW for mobility until lately and that her son has been assisting her with some ADLs. Pt currently requires Sup with UB ADLs, Sup with mobility using RW, mod - min A with LB ADLs and Sup with grooming in standing       If plan is discharge home, recommend the following: Help with stairs or ramp for entrance;Assist for transportation;A little help with bathing/dressing/bathroom;A little help with walking and/or transfers;Assistance with cooking/housework;Direct supervision/assist for medications management    Functional Status Assessment  Patient has had a recent decline in their functional status and demonstrates the ability to make significant improvements in function in a reasonable and predictable amount of time.  Equipment Recommendations       Recommendations for Other Services       Precautions / Restrictions Precautions Precautions: Fall Precaution Comments: recent T3, T5 comp fxs Restrictions Weight Bearing Restrictions: No Other Position/Activity Restrictions: contact precautions      Mobility Bed Mobility               General bed mobility comments: pt in recliner upon arrival    Transfers Overall transfer level: Needs assistance Equipment used: Rolling walker (2 wheels) Transfers: Sit to/from Stand Sit  to Stand: Supervision           General transfer comment: supervision for safety      Balance Overall balance assessment: Needs assistance Sitting-balance support: Feet supported Sitting balance-Leahy Scale: Good     Standing balance support: No upper extremity supported Standing balance-Leahy Scale: Fair                             ADL either performed or assessed with clinical judgement   ADL Overall ADL's : Needs assistance/impaired     Grooming: Wash/dry hands;Wash/dry face;Supervision/safety;Standing   Upper Body Bathing: Supervision/ safety;Sitting Upper Body Bathing Details (indicate cue type and reason): simulated Lower Body Bathing: Moderate assistance, Minimal assistance;Sitting/lateral leans;Sit to/from stand   Upper Body Dressing : Supervision/safety;Sitting   Lower Body Dressing: Moderate assistance Lower Body Dressing Details (indicate cue type and reason): socks Toilet Transfer: Ambulation;Rolling walker (2 wheels);Regular Toilet;Grab bars;Supervision/safety   Toileting- Clothing Manipulation and Hygiene: Supervision/safety       Functional mobility during ADLs: Supervision/safety;Rolling walker (2 wheels)       Vision Baseline Vision/History: 2 Legally blind Patient Visual Report: No change from baseline       Perception         Praxis         Pertinent Vitals/Pain Pain Assessment Pain Assessment: 0-10 Pain Score: 5  Pain Location: back Pain Descriptors / Indicators: Aching Pain Intervention(s): Monitored during session, Repositioned     Extremity/Trunk Assessment Upper Extremity Assessment Upper Extremity Assessment: Generalized weakness   Lower Extremity Assessment Lower Extremity Assessment: Defer to PT evaluation  Communication     Cognition Arousal: Alert Behavior During Therapy: WFL for tasks assessed/performed Overall Cognitive Status: Within Functional Limits for tasks assessed                                        General Comments       Exercises     Shoulder Instructions      Home Living Family/patient expects to be discharged to:: Private residence Living Arrangements: Children Available Help at Discharge: Family;Available 24 hours/day Type of Home: House Home Access: Ramped entrance     Home Layout: One level     Bathroom Shower/Tub: Walk-in shower;Sponge bathes at baseline   [employer]: Standard     Home Equipment: Rollator (4 wheels);Rolling Walker (2 wheels);BSC/3in1;Other (comment);Hospital bed;Wheelchair - manual   Additional Comments: Pt lives with son [name] - very supportive, provides assist as needed      Prior Functioning/Environment Prior Level of Function : Independent/Modified Independent               ADLs Comments: Son has been assisting with ADLs lately. Pt states she was doing her own ADL tasks until recently        OT Problem List: Decreased strength;Decreased range of motion;Decreased activity tolerance;Impaired balance (sitting and/or standing);Decreased safety awareness;Decreased knowledge of use of DME or AE;Cardiopulmonary status limiting activity;Obesity;Impaired UE functional use;Pain;Increased edema      OT Treatment/Interventions: Self-care/ADL training;Therapeutic activities;DME and/or AE instruction;Patient/family education    OT Goals(Current goals can be found in the care plan section) Acute Rehab OT Goals Patient Stated Goal: go home OT Goal Formulation: With patient Time For Goal Achievement: 10/17/22 Potential to Achieve Goals: Good ADL Goals Pt Will Perform Grooming: with set-up;with modified independence;standing Pt Will Perform Upper Body Bathing: with set-up Pt Will Perform Lower Body Bathing: with min assist;with adaptive equipment Pt Will Perform Upper Body Dressing: with set-up Pt Will Perform Lower Body Dressing: with min assist;with adaptive equipment Pt Will Transfer to Toilet: with  modified independence;ambulating Pt Will Perform Toileting - Clothing Manipulation and hygiene: with modified independence;Independently;sit to/from stand  OT Frequency: Min 1X/week    Co-evaluation              AM-PAC OT "6 Clicks" Daily Activity     Outcome Measure Help from another person eating meals?: None Help from another person taking care of personal grooming?: A Little Help from another person toileting, which includes using toliet, bedpan, or urinal?: A Little Help from another person bathing (including washing, rinsing, drying)?: A Little Help from another person to put on and taking off regular upper body clothing?: A Little Help from another person to put on and taking off regular lower body clothing?: A Little 6 Click Score: 19   End of Session Equipment Utilized During Treatment: Rolling walker (2 wheels);Gait belt  Activity Tolerance: Patient tolerated treatment well Patient left: with call bell/phone within reach;in chair;with chair alarm set  OT Visit Diagnosis: Muscle weakness (generalized) (M62.81);Unsteadiness on feet (R26.81) Pain - part of body:  (back)                Time: 3244-0102 OT Time Calculation (min): 24 min Charges:  OT General Charges $OT Visit: 1 Visit OT Evaluation $OT Eval Low Complexity: 1 Low OT Treatments $Self Care/Home Management : 8-22 mins   Galen Manila 10/03/2022, 1:14 PM

## 2022-10-03 NOTE — TOC Initial Note (Signed)
Transition of Care [facility]) - Initial/Assessment Note    Patient Details  Name: [name] MRN: [id number] Date of Birth: 10-Jan-1956  Transition of Care [facility]) CM/SW Contact:    Larrie Kass, LCSW Phone Number: 10/03/2022, 12:31 PM  Clinical Narrative:                 Pt is active with [facility] for home health PT/OT.TOC to follow for d/c needs.  Expected Discharge Plan: Home w Home Health Services Barriers to Discharge: Continued Medical Work up   Patient Goals and CMS Choice Patient states their goals for this hospitalization and ongoing recovery are:: return home with home health          Expected Discharge Plan and Services       Living arrangements for the past 2 months: Single Family Home                                      Prior Living Arrangements/Services Living arrangements for the past 2 months: Single Family Home Lives with:: Self, Adult Children   Do you feel safe going back to the place where you live?: Yes               Activities of Daily Living Home Assistive Devices/Equipment: Walker (specify type) ADL Screening (condition at time of admission) Patient's cognitive ability adequate to safely complete daily activities?: Yes Is the patient deaf or have difficulty hearing?: No Does the patient have difficulty seeing, even when wearing glasses/contacts?: Yes Does the patient have difficulty concentrating, remembering, or making decisions?: No Patient able to express need for assistance with ADLs?: Yes Does the patient have difficulty dressing or bathing?: Yes Independently performs ADLs?: No Communication: Independent Dressing (OT): Needs assistance Is this a change from baseline?: Pre-admission baseline Grooming: Needs assistance Is this a change from baseline?: Pre-admission baseline Feeding: Independent Bathing: Needs assistance Is this a change from baseline?: Pre-admission baseline Toileting: Needs assistance Is this a  change from baseline?: Pre-admission baseline In/Out Bed: Needs assistance Is this a change from baseline?: Pre-admission baseline Walks in Home: Needs assistance Is this a change from baseline?: Pre-admission baseline Does the patient have difficulty walking or climbing stairs?: Yes Weakness of Legs: Both Weakness of Arms/Hands: None  Permission Sought/Granted                  Emotional Assessment              Admission diagnosis:  Acute cystitis without hematuria [N30.00] Sepsis secondary to UTI (HCC) [A41.9, N39.0] Altered mental status, unspecified altered mental status type [R41.82] Patient Active Problem List   Diagnosis Date Noted   Bacteremia due to Pseudomonas 10/01/2022   Severe sepsis (HCC) 09/30/2022   Hyperammonemia (HCC) 09/30/2022   Cellulitis of left lower extremity 08/18/2022   Sepsis with acute renal failure, tubular necrosis, and septic shock (HCC) 08/18/2022   Physical deconditioning 08/17/2022   Goals of care, counseling/discussion 08/17/2022   Decompensated hepatic cirrhosis (HCC) 08/17/2022   Septic shock (HCC) 08/17/2022   Hypotension 08/11/2022   Folate deficiency 06/14/2022   Acute metabolic encephalopathy 06/13/2022   Systolic murmur 06/12/2022   Iron deficiency anemia 05/16/2022   Thrombocytopenia (HCC) 05/16/2022   History of eye surgery 05/01/2022   AKI (acute kidney injury) (HCC) 04/30/2022   Cellulitis of right thigh 03/07/2022   Pleural effusion, left 01/29/2022   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  Corneal scarring 09/10/2020   Cerebral atherosclerosis 08/20/2020   Short gut syndrome 08/20/2020   Abdominal wall hernia 03/06/2020   Splenomegaly 02/06/2019   Vitamin B12 deficiency 01/21/2019   Depression, major, recurrent, moderate (HCC) 09/10/2018   Hyperuricemia 08/25/2016   GERD (gastroesophageal reflux disease) 07/25/2016   Hypothyroidism 07/25/2016   Morbid obesity (HCC) 07/25/2014   Chronic diarrhea 06/01/2014    Prerenal azotemia 06/01/2014   Hypokalemia 06/01/2014   Pulmonary emboli (HCC) 01/10/2009   DVT (deep venous thrombosis) (HCC) 01/10/1997   PCP:  [name], MD Pharmacy:   [id number] - [place], [place] - [address] [place] [address] Phone: [phone] Fax: [phone]  [id number], [place] - [address] AT [facility] [address] [place] [address] Phone: [phone] Fax: [phone]     Social Determinants of Health (SDOH) Social History: SDOH Screenings   Food Insecurity: No Food Insecurity (09/30/2022)  Housing: Low Risk  (09/30/2022)  Transportation Needs: No Transportation Needs (09/30/2022)  Utilities: Not At Risk (09/30/2022)  Tobacco Use: Medium Risk (10/01/2022)   SDOH Interventions:     Readmission Risk Interventions    08/15/2022    3:20 PM 05/18/2022   10:24 AM 05/06/2022    3:39 PM  Readmission Risk Prevention Plan  Transportation Screening Complete Complete Complete  PCP or Specialist Appt within 5-7 Days  Complete Complete  Home Care Screening  Complete Complete  Medication Review (RN CM)  Complete Complete  Medication Review (RN Care Manager) Complete    PCP or Specialist appointment within 3-5 days of discharge Complete    HRI or Home Care Consult Complete    SW Recovery Care/Counseling Consult Complete    Palliative Care Screening Not Applicable    Skilled Nursing Facility Not Applicable

## 2022-10-03 NOTE — Plan of Care (Signed)
  Problem: Clinical Measurements: Goal: Signs and symptoms of infection will decrease Outcome: Progressing   Problem: Education: Goal: Knowledge of General Education information will improve Description: Including pain rating scale, medication(s)/side effects and non-pharmacologic comfort measures Outcome: Progressing   Problem: Health Behavior/Discharge Planning: Goal: Ability to manage health-related needs will improve Outcome: Progressing   Problem: Clinical Measurements: Goal: Ability to maintain clinical measurements within normal limits will improve Outcome: Progressing   Problem: Activity: Goal: Risk for activity intolerance will decrease Outcome: Progressing

## 2022-10-03 NOTE — Progress Notes (Signed)
PROGRESS NOTE  [name] IEP:[id number] DOB: [birth year]   PCP: [name], MD  Patient is from: Home.  Lives with son.  Uses rolling walker and wheelchair at baseline.  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  She had encephalopathy, SIRS, and lactic acidosis to 3.8 meeting criteria for severe sepsis.  UA concerning for hematuria.  CT head without acute finding.  Ammonia elevated to 63.  CXR raise concern for CHF.  Blood and urine cultures ordered.  Started on IV ceftriaxone and lactulose and admitted.  The next day, blood culture with Pseudomonas aeruginosa.  Antibiotics escalated to IV cefepime.  Encephalopathy resolved.  CT abdomen and pelvis without acute finding but chronic compression fractures, mesenteric congestion and small ascites.  Antibiotic escalated to IV meropenem based on culture sensitivity.  ID consulted.  Subjective: Seen and examined earlier this morning.  No major events overnight of this morning.  Noted some vaginal bleeding overnight but not profuse.  Denies blood clots.  Also reports some abdominal pain but no acute change.  Objective: Vitals:   10/02/22 1638 10/02/22 2017 10/03/22 0322 10/03/22 0500  BP: 117/67 126/70 (!) 126/57   Pulse: 77 73 74   Resp:  15 15   Temp:  98.8 F (37.1 C) 98.2 F (36.8 C)   TempSrc:  Oral Oral   SpO2:  96% 100%   Weight:    99.1 kg  Height:        Examination:  GENERAL: No apparent distress.  Nontoxic. HEENT: MMM.  Vision and hearing grossly intact.  NECK: Supple.  No apparent JVD.  RESP:  No IWOB.   Fair aeration bilaterally. CVS:  RRR. Heart sounds normal.  ABD/GI/GU: BS+. Abd soft.  Diffuse tenderness. MSK/EXT:  Moves extremities. No apparent deformity. No edema.  SKIN: no apparent skin lesion or wound NEURO: Awake, alert and oriented appropriately.  No apparent focal neuro deficit. PSYCH: Calm. Normal affect.   Procedures:  None  Microbiology summarized: COVID-19 PCR nonreactive Blood culture with Pseudomonas aeruginosa only sensitive to imipenem Urine culture negative.  Assessment and plan: Principal Problem:   Severe sepsis (HCC) Active Problems:   Acute metabolic encephalopathy   Cirrhosis due to NASH   GERD (gastroesophageal reflux disease)   Hypothyroidism   Iron deficiency anemia   Thrombocytopenia (HCC)   Hypotension   Hyperammonemia (HCC)   Short gut syndrome   Morbid obesity (HCC)   Bacteremia due to Pseudomonas  Severe sepsis due to Pseudomonas bacteremia: POA.  She has leukocytosis, tachycardia, tachypnea, encephalopathy and lactic acidosis.  Suspect GI source given complex GI history including liver cirrhosis, short gut syndrome with chronic diarrhea.  She denies UTI symptoms.  UA not convincing for UTI. Urine culture negative. CT abdomen and pelvis without acute finding.  She has diffuse tenderness to palpation.  She does not seem to have diarrhea currently.  Encephalopathy and lactic acidosis resolved even before appropriate antibiotic. -Change antibiotics to IV meropenem -ID consulted  Acute metabolic encephalopathy: Likely due to sepsis and elevated ammonia.  Resolved. -Treat sepsis as above -Continue lactulose -Reorientation and delirium precaution  NASH cirrhosis: Stable.  She has abdominal tenderness. CT abdomen and pelvis without acute finding.  Ascites mild. -Continue Xifaxan and lactulose -Continue home diuretics -Continue low-sodium diet   CKD-3A: Stable Recent Labs    09/04/22 2024 09/05/22 1317 09/06/22 0701 09/08/22 0553  09/09/22 0606 09/30/22 0800 09/30/22 0817 10/01/22 0419 10/02/22 0419 10/03/22 0403  BUN 15 13 12 13 14  25* 26* 26* 29* 28*  CREATININE 1.13* 1.04* 1.12* 1.38* 1.22* 1.23* 1.40* 1.26* 1.17* 1.06*  -Continue monitoring    Iron deficiency anemia/abnormal uterine bleed: She reports vaginal spotting for the last 10 days.  She says she has upcoming appointment with gynecology in a week.  Denies blood clots or heavy bleeding. Recent Labs    09/02/22 0501 09/03/22 1520 09/04/22 2024 09/05/22 1317 09/06/22 0701 09/30/22 0800 09/30/22 0817 10/01/22 0419 10/02/22 0419 10/03/22 0403  HGB 8.6* 9.1* 8.7* 8.8* 8.9* 9.6* 8.8* 8.0* 7.4* 8.0*  -Monitor CBC daily    Thrombocytopenia (HCC): Likely due to liver cirrhosis.  Stable. Recent Labs  Lab 09/30/22 0800 10/01/22 0419 10/02/22 0419 10/03/22 0403  PLT 136* 117* 106* 123*  -Continue monitoring  Physical deconditioning: Reports using rolling walker and wheelchair at baseline. -PT/OT eval  Chronic hypotension: On midodrine at home.  BP slightly elevated. -Continue home midodrine   GERD -Continue home PPI   Hypothyroidism -Continue levothyroxine 150 mcg p.o. daily.  Hypokalemia/hypomagnesemia -Monitor replenish as appropriate  History of PE/DVT: No on anticoagulation.  High risk for VTE -Start prophylactic subcu Lovenox  Morbid obesity Body mass index is 39.96 kg/m.           DVT prophylaxis:  enoxaparin (LOVENOX) injection 40 mg Start: 10/01/22 1300 SCDs Start: 09/30/22 1241  Code Status: DNR/DNI Family Communication: None at bedside Level of care: Med-Surg Status is: Inpatient Remains inpatient appropriate because: Severe sepsis and Pseudomonas bacteremia   Final disposition: Home Consultants:  None  55 minutes with more than 50% spent in reviewing records, counseling patient/family and coordinating care.   Sch Meds:  Scheduled Meds:  atorvastatin  40 mg Oral q AM   cyanocobalamin  2,000 mcg Oral  Q Fri   enoxaparin (LOVENOX) injection  40 mg Subcutaneous Q24H   folic acid  1 mg Oral Daily   furosemide  40 mg Oral BID    HYDROmorphone (DILAUDID) injection  1 mg Intravenous Once   lactulose  30 g Oral BID   levothyroxine  150 mcg Oral QAC breakfast   magnesium gluconate  500 mg Oral Daily   midodrine  10 mg Oral TID WC   nystatin cream   Topical BID   pantoprazole  40 mg Oral BID AC   rifaximin  550 mg Oral BID   rOPINIRole  0.25 mg Oral QHS   spironolactone  25 mg Oral Daily   tamsulosin  0.4 mg Oral Daily   venlafaxine XR  75 mg Oral Q breakfast   Continuous Infusions:  meropenem (MERREM) IV 1 g (10/03/22 1119)   PRN Meds:.acetaminophen **OR** acetaminophen, hydrOXYzine, lactulose, oxyCODONE, prochlorperazine  Antimicrobials: Anti-infectives (From admission, onward)    Start     Dose/Rate Route Frequency Ordered Stop   10/03/22 1100  meropenem (MERREM) 1 g in sodium chloride 0.9 % 100 mL IVPB        1 g 200 mL/hr over 30 Minutes Intravenous Every 8 hours 10/03/22 1014     10/01/22 1130  ceFEPIme (MAXIPIME) 2 g in sodium chloride 0.9 % 100 mL IVPB  Status:  Discontinued  2 g 200 mL/hr over 30 Minutes Intravenous Every 8 hours 10/01/22 1043 10/03/22 1014   09/30/22 2200  rifaximin (XIFAXAN) tablet 550 mg        550 mg Oral 2 times daily 09/30/22 1805     09/30/22 0830  cefTRIAXone (ROCEPHIN) 2 g in sodium chloride 0.9 % 100 mL IVPB  Status:  Discontinued        2 g 200 mL/hr over 30 Minutes Intravenous Every 24 hours 09/30/22 0823 10/01/22 1043        I have personally reviewed the following labs and images: CBC: Recent Labs  Lab 09/30/22 0800 09/30/22 0817 10/01/22 0419 10/02/22 0419 10/03/22 0403  WBC 15.2*  --  12.6* 6.9 6.2  NEUTROABS 13.4*  --   --   --   --   HGB 9.6* 8.8* 8.0* 7.4* 8.0*  HCT 30.5* 26.0* 25.3* 23.9* 26.8*  MCV 96.5  --  97.3 99.2 100.4*  PLT 136*  --  117* 106* 123*   BMP &GFR Recent Labs  Lab 09/30/22 0800 09/30/22 0817  10/01/22 0419 10/02/22 0419 10/03/22 0403  NA 136 140 133* 134* 135  K 3.8 3.9 3.5 3.2* 3.5  CL 105 105 105 105 109  CO2 19*  --  21* 22 20*  GLUCOSE 108* 101* 95 124* 123*  BUN 25* 26* 26* 29* 28*  CREATININE 1.23* 1.40* 1.26* 1.17* 1.06*  CALCIUM 8.2*  --  7.9* 7.5* 7.9*  MG  --   --   --  1.6* 2.1  PHOS  --   --   --  3.8 3.7   Estimated Creatinine Clearance: 58.2 mL/min (A) (by C-G formula based on SCr of 1.06 mg/dL (H)). Liver & Pancreas: Recent Labs  Lab 09/30/22 0800 10/01/22 0419 10/02/22 0419 10/03/22 0403  AST 41 42* 37  --   ALT 24 22 22   --   ALKPHOS 165* 117 108  --   BILITOT 2.3* 1.4* 0.9  --   PROT 6.4* 4.9* 4.8*  --   ALBUMIN 2.6* 2.0* 1.9* 2.2*   No results for input(s): "LIPASE", "AMYLASE" in the last 168 hours. Recent Labs  Lab 09/30/22 0800 10/02/22 0419  AMMONIA 63* 48*   Diabetic: No results for input(s): "HGBA1C" in the last 72 hours. No results for input(s): "GLUCAP" in the last 168 hours. Cardiac Enzymes: No results for input(s): "CKTOTAL", "CKMB", "CKMBINDEX", "TROPONINI" in the last 168 hours. No results for input(s): "PROBNP" in the last 8760 hours. Coagulation Profile: Recent Labs  Lab 09/30/22 0800  INR 1.6*   Thyroid Function Tests: No results for input(s): "TSH", "T4TOTAL", "FREET4", "T3FREE", "THYROIDAB" in the last 72 hours. Lipid Profile: No results for input(s): "CHOL", "HDL", "LDLCALC", "TRIG", "CHOLHDL", "LDLDIRECT" in the last 72 hours. Anemia Panel: No results for input(s): "VITAMINB12", "FOLATE", "FERRITIN", "TIBC", "IRON", "RETICCTPCT" in the last 72 hours. Urine analysis:    Component Value Date/Time   COLORURINE YELLOW 09/30/2022 1126   APPEARANCEUR HAZY (A) 09/30/2022 1126   LABSPEC 1.012 09/30/2022 1126   PHURINE 5.0 09/30/2022 1126   GLUCOSEU NEGATIVE 09/30/2022 1126   HGBUR LARGE (A) 09/30/2022 1126   BILIRUBINUR NEGATIVE 09/30/2022 1126   KETONESUR NEGATIVE 09/30/2022 1126   PROTEINUR 30 (A) 09/30/2022  1126   UROBILINOGEN 0.2 08/21/2013 0017   NITRITE NEGATIVE 09/30/2022 1126   LEUKOCYTESUR NEGATIVE 09/30/2022 1126   Sepsis Labs: Invalid input(s): "PROCALCITONIN", "LACTICIDVEN"  Microbiology: Recent Results (from the past 240 hour(s))  SARS Coronavirus 2 by RT  PCR (hospital order, performed in [facility] hospital lab) *cepheid single result test* Anterior Nasal Swab     Status: None   Collection Time: 09/30/22  7:45 AM   Specimen: Anterior Nasal Swab  Result Value Ref Range Status   SARS Coronavirus 2 by RT PCR NEGATIVE NEGATIVE Final    Comment: (NOTE) SARS-CoV-2 target nucleic acids are NOT DETECTED.  The SARS-CoV-2 RNA is generally detectable in upper and lower respiratory specimens during the acute phase of infection. The lowest concentration of SARS-CoV-2 viral copies this assay can detect is 250 copies / mL. A negative result does not preclude SARS-CoV-2 infection and should not be used as the sole basis for treatment or other patient management decisions.  A negative result may occur with improper specimen collection / handling, submission of specimen other than nasopharyngeal swab, presence of viral mutation(s) within the areas targeted by this assay, and inadequate number of viral copies (<250 copies / mL). A negative result must be combined with clinical observations, patient history, and epidemiological information.  Fact Sheet for Patients:   RoadLapTop.co.za  Fact Sheet for Healthcare Providers: http://kim-miller.com/  This test is not yet approved or  cleared by the [place] FDA and has been authorized for detection and/or diagnosis of SARS-CoV-2 by FDA under an Emergency Use Authorization (EUA).  This EUA will remain in effect (meaning this test can be used) for the duration of the COVID-19 declaration under Section 564(b)(1) of the Act, 21 U.S.C. section 360bbb-3(b)(1), unless the authorization is terminated  or revoked sooner.  Performed at [facility], [address], [place], [place] [address]   Blood culture (routine x 2)     Status: Abnormal   Collection Time: 09/30/22  9:30 AM   Specimen: BLOOD RIGHT HAND  Result Value Ref Range Status   Specimen Description BLOOD RIGHT HAND  Final   Special Requests   Final    BOTTLES DRAWN AEROBIC AND ANAEROBIC Blood Culture adequate volume   Culture  Setup Time   Final    GRAM NEGATIVE RODS AEROBIC BOTTLE ONLY CRITICAL RESULT CALLED TO, READ BACK BY AND VERIFIED WITH: PHARMD Cherylin Mylar on [id number] @1021H  BY SM    Culture PSEUDOMONAS AERUGINOSA (A)  Final   Report Status 10/03/2022 FINAL  Final   Organism ID, Bacteria PSEUDOMONAS AERUGINOSA  Final      Susceptibility   Pseudomonas aeruginosa - MIC*    CEFTAZIDIME 16 INTERMEDIATE Intermediate     CIPROFLOXACIN 1 INTERMEDIATE Intermediate     GENTAMICIN 8 INTERMEDIATE Intermediate     IMIPENEM 2 SENSITIVE Sensitive     CEFEPIME Value in next row Resistant      RESISTANTPerformed at [facility], [address] [place], [place] [address]    * PSEUDOMONAS AERUGINOSA  Blood Culture ID Panel (Reflexed)     Status: Abnormal   Collection Time: 09/30/22  9:30 AM  Result Value Ref Range Status   Enterococcus faecalis NOT DETECTED NOT DETECTED Final   Enterococcus Faecium NOT DETECTED NOT DETECTED Final   Listeria monocytogenes NOT DETECTED NOT DETECTED Final   Staphylococcus species NOT DETECTED NOT DETECTED Final   Staphylococcus aureus (BCID) NOT DETECTED NOT DETECTED Final   Staphylococcus epidermidis NOT DETECTED NOT DETECTED Final   Staphylococcus lugdunensis NOT DETECTED NOT DETECTED Final   Streptococcus species NOT DETECTED NOT DETECTED Final   Streptococcus agalactiae NOT DETECTED NOT DETECTED Final   Streptococcus pneumoniae NOT DETECTED NOT DETECTED Final   Streptococcus pyogenes  NOT DETECTED NOT DETECTED Final   A.calcoaceticus-baumannii NOT DETECTED NOT  DETECTED Final   Bacteroides fragilis NOT DETECTED NOT DETECTED Final   Enterobacterales NOT DETECTED NOT DETECTED Final   Enterobacter cloacae complex NOT DETECTED NOT DETECTED Final   Escherichia coli NOT DETECTED NOT DETECTED Final   Klebsiella aerogenes NOT DETECTED NOT DETECTED Final   Klebsiella oxytoca NOT DETECTED NOT DETECTED Final   Klebsiella pneumoniae NOT DETECTED NOT DETECTED Final   Proteus species NOT DETECTED NOT DETECTED Final   Salmonella species NOT DETECTED NOT DETECTED Final   Serratia marcescens NOT DETECTED NOT DETECTED Final   Haemophilus influenzae NOT DETECTED NOT DETECTED Final   Neisseria meningitidis NOT DETECTED NOT DETECTED Final   Pseudomonas aeruginosa (A) NOT DETECTED Final    CRITICAL RESULT CALLED TO, READ BACK BY AND VERIFIED WITH:    Comment: PHARMD S.DAVIS AT 1021 ON 10/01/2022 BY SM. DETECTED    Stenotrophomonas maltophilia NOT DETECTED NOT DETECTED Final   Candida albicans NOT DETECTED NOT DETECTED Final   Candida auris NOT DETECTED NOT DETECTED Final   Candida glabrata NOT DETECTED NOT DETECTED Final   Candida krusei NOT DETECTED NOT DETECTED Final   Candida parapsilosis NOT DETECTED NOT DETECTED Final   Candida tropicalis NOT DETECTED NOT DETECTED Final   Cryptococcus neoformans/gattii NOT DETECTED NOT DETECTED Final   CTX-M ESBL NOT DETECTED NOT DETECTED Final   Carbapenem resistance IMP NOT DETECTED NOT DETECTED Final   Carbapenem resistance KPC NOT DETECTED NOT DETECTED Final   Carbapenem resistance NDM NOT DETECTED NOT DETECTED Final   Carbapenem resist OXA 48 LIKE NOT DETECTED NOT DETECTED Final   Carbapenem resistance VIM NOT DETECTED NOT DETECTED Final    Comment: Performed at [facility], [name] [place], [place] [address]  Blood culture (routine x 2)     Status: Abnormal   Collection Time: 09/30/22  9:49 AM   Specimen: BLOOD LEFT ARM  Result Value Ref Range Status   Specimen Description   Final    BLOOD LEFT  ARM Performed at [facility], [address], [place], [place] [address]    Special Requests   Final    BOTTLES DRAWN AEROBIC AND ANAEROBIC Blood Culture adequate volume Performed at [facility], [address] [place], [place] [address]    Culture  Setup Time   Final    GRAM NEGATIVE RODS AEROBIC BOTTLE ONLY CRITICAL VALUE NOTED.  VALUE IS CONSISTENT WITH PREVIOUSLY REPORTED AND CALLED VALUE.    Culture (A)  Final    PSEUDOMONAS AERUGINOSA SUSCEPTIBILITIES PERFORMED ON PREVIOUS CULTURE WITHIN THE LAST 5 DAYS. Performed at [facility], [address] [place], [place] [address]    Report Status 10/03/2022 FINAL  Final  Urine Culture     Status: None   Collection Time: 09/30/22 11:26 AM   Specimen: Urine, Random  Result Value Ref Range Status   Specimen Description   Final    URINE, RANDOM Performed at [facility], [address], [place], [place] [address]    Special Requests   Final    NONE Reflexed from [id number] Performed at [facility], [address] [place], [place] [address]    Culture   Final    NO GROWTH Performed at [facility], [address], [place], [place] [address]    Report Status 10/01/2022 FINAL  Final    Radiology Studies: No results found.  Renuka Farfan T. Valeria Boza [facility]  If 7PM-7AM, please contact night-coverage [email or web address] 10/03/2022, 12:21 PM

## 2022-10-04 DIAGNOSIS — K7460 Unspecified cirrhosis of liver: Secondary | ICD-10-CM | POA: Diagnosis not present

## 2022-10-04 DIAGNOSIS — D696 Thrombocytopenia, unspecified: Secondary | ICD-10-CM | POA: Diagnosis not present

## 2022-10-04 DIAGNOSIS — K90829 Short bowel syndrome, unspecified: Secondary | ICD-10-CM | POA: Diagnosis not present

## 2022-10-04 DIAGNOSIS — A419 Sepsis, unspecified organism: Secondary | ICD-10-CM | POA: Diagnosis not present

## 2022-10-04 MED ORDER — ORAL CARE MOUTH RINSE: 15.0000 mL | OROMUCOSAL | Status: DC | PRN

## 2022-10-04 NOTE — Progress Notes (Incomplete)
Repeat BCx

## 2022-10-04 NOTE — Progress Notes (Signed)
PHARMACY CONSULT NOTE FOR:  OUTPATIENT  PARENTERAL ANTIBIOTIC THERAPY (OPAT)  Indication: Pseudomonas bacteremia Regimen: Meropenem 1g IV every 8 hours End date: 10/12/22  IV antibiotic discharge orders are pended. To discharging provider:  please sign these orders via discharge navigator,  Select New Orders & click on the button choice - Manage This Unsigned Work.     Thank you for allowing pharmacy to be a part of this patient's care.  Georgina Pillion, PharmD, BCPS, BCIDP Infectious Diseases Clinical Pharmacist 10/04/2022 6:54 AM   **Pharmacist phone directory can now be found on [email or web address] ([email or web address]).  Listed under [facility].

## 2022-10-04 NOTE — Plan of Care (Signed)
Problem: Activity: Goal: Risk for activity intolerance will decrease Outcome: Progressing   Problem: Coping: Goal: Level of anxiety will decrease Outcome: Progressing   Problem: Elimination: Goal: Will not experience complications related to urinary retention Outcome: Progressing   Problem: Pain Managment: Goal: General experience of comfort will improve Outcome: Progressing

## 2022-10-04 NOTE — Plan of Care (Signed)
Problem: Fluid Volume: Goal: Hemodynamic stability will improve Outcome: Progressing   Problem: Clinical Measurements: Goal: Diagnostic test results will improve Outcome: Progressing Goal: Signs and symptoms of infection will decrease Outcome: Progressing   Problem: Respiratory: Goal: Ability to maintain adequate ventilation will improve Outcome: Progressing

## 2022-10-04 NOTE — Progress Notes (Signed)
Mobility Specialist - Progress Note   10/04/22 1057  Mobility  Activity Ambulated with assistance in hallway  Level of Assistance Standby assist, set-up cues, supervision of patient - no hands on  Assistive Device Front wheel walker  Distance Ambulated (ft) 150 ft  Range of Motion/Exercises Active  Activity Response Tolerated well  Mobility Referral Yes  $Mobility charge 1 Mobility  Mobility Specialist Start Time (ACUTE ONLY) 1040  Mobility Specialist Stop Time (ACUTE ONLY) 1057  Mobility Specialist Time Calculation (min) (ACUTE ONLY) 17 min   Pt was found in bathroom and agreeable to ambulate after x1 seated rest break. Pt grew fatigued with session. At EOS returned to bed with all needs met. Call bell in reach and bed alarm on.  Billey Chang Mobility Specialist

## 2022-10-04 NOTE — Progress Notes (Addendum)
PROGRESS NOTE  [name] ION:[id number] DOB: December 02, 1955   PCP: [name], MD  Patient is from: Home.  Lives with son.  Uses rolling walker and wheelchair at baseline.  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  She had encephalopathy, SIRS, and lactic acidosis to 3.8 meeting criteria for severe sepsis.  UA concerning for hematuria.  CT head without acute finding.  Ammonia elevated to 63.  CXR raise concern for CHF.  Blood and urine cultures ordered.  Started on IV ceftriaxone and lactulose and admitted.  The next day, blood culture with Pseudomonas aeruginosa.  Antibiotics escalated to IV cefepime.  Encephalopathy resolved.  CT abdomen and pelvis without acute finding but chronic compression fractures, mesenteric congestion and small ascites.  Antibiotic escalated to IV meropenem based on culture sensitivity.  ID consulted and recommended repeat blood culture and IV meropenem for 8 days.  Subjective: Seen and examined earlier this morning.  No major events overnight of this morning.  Reports some vaginal bleeding but no profuse bleeding or blood clot.  No other complaints.  Objective: Vitals:   10/03/22 1336 10/03/22 2208 10/04/22 0639 10/04/22 0800  BP: 132/74 133/71 122/66 137/66  Pulse: 76 86 87 72  Resp: 18 16 15 16   Temp: 98.2 F (36.8 C) 98.1 F (36.7 C) 98 F (36.7 C) 98 F (36.7 C)  TempSrc: Oral Oral Oral Oral  SpO2: 99% 100% 100% 99%  Weight:      Height:        Examination:  GENERAL: No apparent distress.  Nontoxic. HEENT: MMM.  Vision and  hearing grossly intact.  NECK: Supple.  No apparent JVD.  RESP:  No IWOB.  Fair aeration bilaterally. CVS:  RRR. Heart sounds normal.  ABD/GI/GU: BS+. Abd soft.  Diffuse tenderness. MSK/EXT:  Moves extremities. No apparent deformity. No edema.  SKIN: no apparent skin lesion or wound NEURO: Awake, alert and oriented appropriately.  No apparent focal neuro deficit. PSYCH: Calm. Normal affect.   Procedures:  None  Microbiology summarized: 9/20-COVID-19 PCR nonreactive 9/20-blood culture with Pseudomonas aeruginosa only sensitive to imipenem 9/20-urine culture negative. 9/24-repeat blood culture pending  Assessment and plan: Principal Problem:   Severe sepsis (HCC) Active Problems:   Acute metabolic encephalopathy   Cirrhosis due to NASH   GERD (gastroesophageal reflux disease)   Hypothyroidism   Iron deficiency anemia   Thrombocytopenia (HCC)   Hypotension   Hyperammonemia (HCC)   Short gut syndrome   Morbid obesity (HCC)   Bacteremia due to Pseudomonas  Severe sepsis due to MDR Pseudomonas bacteremia: POA.  She has leukocytosis, tachycardia, tachypnea, encephalopathy and lactic acidosis.  Unclear source of infection.  She denies UTI symptoms.  UA not convincing for UTI. Urine culture negative. CT abdomen and pelvis without acute finding.  She has diffuse tenderness to palpation.  She does not seem to have diarrhea currently.  Encephalopathy and lactic acidosis resolved even before appropriate antibiotic. -CTX 9/20>> cefepime 9/21>> meropenem 9/23>>> -Appreciate ID input-repeat blood culture and IV meropenem for a total of 8 days  Acute metabolic encephalopathy: Likely due to sepsis and elevated ammonia.  Resolved. -Treat  sepsis as above -Continue lactulose -Reorientation and delirium precaution  NASH cirrhosis: Stable.  She has abdominal tenderness. CT abdomen and pelvis without acute finding.  Ascites mild. -Continue Xifaxan and lactulose -Continue home  diuretics -Continue low-sodium diet   CKD-3A: Stable Recent Labs    09/04/22 2024 09/05/22 1317 09/06/22 0701 09/08/22 0553 09/09/22 0606 09/30/22 0800 09/30/22 0817 10/01/22 0419 10/02/22 0419 10/03/22 0403  BUN 15 13 12 13 14  25* 26* 26* 29* 28*  CREATININE 1.13* 1.04* 1.12* 1.38* 1.22* 1.23* 1.40* 1.26* 1.17* 1.06*  -Continue monitoring    Iron deficiency anemia/abnormal uterine bleed: She reports vaginal spotting for the last 10 days.  She says she has upcoming appointment with gynecology in a week.  Denies blood clots or heavy bleeding. Recent Labs    09/02/22 0501 09/03/22 1520 09/04/22 2024 09/05/22 1317 09/06/22 0701 09/30/22 0800 09/30/22 0817 10/01/22 0419 10/02/22 0419 10/03/22 0403  HGB 8.6* 9.1* 8.7* 8.8* 8.9* 9.6* 8.8* 8.0* 7.4* 8.0*  -Monitor CBC daily -Discontinue subcu Lovenox -Outpatient follow-up with gynecology as previously planned    Thrombocytopenia [facility]): Likely due to liver cirrhosis.  Stable. Recent Labs  Lab 09/30/22 0800 10/01/22 0419 10/02/22 0419 10/03/22 0403  PLT 136* 117* 106* 123*  -Continue monitoring  Physical deconditioning: Reports using rolling walker and wheelchair at baseline. -PT/OT eval  Chronic hypotension: On midodrine at home.  BP slightly elevated. -Continue home midodrine   GERD -Continue home PPI   Hypothyroidism -Continue levothyroxine 150 mcg p.o. daily.  Hypokalemia/hypomagnesemia -Monitor replenish as appropriate  History of PE/DVT: No on anticoagulation.  -Discontinue Lovenox in the setting of vaginal bleeding -SCD   Morbid obesity Body mass index is 39.96 kg/m.           DVT prophylaxis:  SCDs Start: 09/30/22 1241  Code Status: DNR/DNI Family Communication: None at bedside Level of care: Med-Surg Status is: Inpatient Remains inpatient appropriate because: Severe sepsis and Pseudomonas bacteremia   Final disposition: Home once cleared by ID Consultants:  Infectious  disease  35 minutes with more than 50% spent in reviewing records, counseling patient/family and coordinating care.   Sch Meds:  Scheduled Meds:  atorvastatin  40 mg Oral q AM   cyanocobalamin  2,000 mcg Oral Q Fri   folic acid  1 mg Oral Daily   furosemide  40 mg Oral BID    HYDROmorphone (DILAUDID) injection  1 mg Intravenous Once   lactulose  30 g Oral BID   levothyroxine  150 mcg Oral QAC breakfast   magnesium gluconate  500 mg Oral Daily   midodrine  10 mg Oral TID WC   nystatin cream   Topical BID   pantoprazole  40 mg Oral BID AC   rifaximin  550 mg Oral BID   rOPINIRole  0.25 mg Oral QHS   spironolactone  25 mg Oral Daily   tamsulosin  0.4 mg Oral Daily   venlafaxine XR  75 mg Oral Q breakfast   Continuous Infusions:  meropenem (MERREM) IV 1 g (10/04/22 0634)   PRN Meds:.acetaminophen **OR** acetaminophen, hydrOXYzine, lactulose, oxyCODONE, prochlorperazine  Antimicrobials: Anti-infectives (From admission, onward)    Start     Dose/Rate Route Frequency Ordered Stop   10/03/22 1100  meropenem (MERREM) 1 g in sodium chloride 0.9 % 100 mL IVPB        1 g 200 mL/hr over 30 Minutes Intravenous Every 8 hours 10/03/22 1014     10/01/22 1130  ceFEPIme (MAXIPIME) 2 g in sodium chloride  0.9 % 100 mL IVPB  Status:  Discontinued        2 g 200 mL/hr over 30 Minutes Intravenous Every 8 hours 10/01/22 1043 10/03/22 1014   09/30/22 2200  rifaximin (XIFAXAN) tablet 550 mg        550 mg Oral 2 times daily 09/30/22 1805     09/30/22 0830  cefTRIAXone (ROCEPHIN) 2 g in sodium chloride 0.9 % 100 mL IVPB  Status:  Discontinued        2 g 200 mL/hr over 30 Minutes Intravenous Every 24 hours 09/30/22 0823 10/01/22 1043        I have personally reviewed the following labs and images: CBC: Recent Labs  Lab 09/30/22 0800 09/30/22 0817 10/01/22 0419 10/02/22 0419 10/03/22 0403  WBC 15.2*  --  12.6* 6.9 6.2  NEUTROABS 13.4*  --   --   --   --   HGB 9.6* 8.8* 8.0* 7.4* 8.0*   HCT 30.5* 26.0* 25.3* 23.9* 26.8*  MCV 96.5  --  97.3 99.2 100.4*  PLT 136*  --  117* 106* 123*   BMP &GFR Recent Labs  Lab 09/30/22 0800 09/30/22 0817 10/01/22 0419 10/02/22 0419 10/03/22 0403  NA 136 140 133* 134* 135  K 3.8 3.9 3.5 3.2* 3.5  CL 105 105 105 105 109  CO2 19*  --  21* 22 20*  GLUCOSE 108* 101* 95 124* 123*  BUN 25* 26* 26* 29* 28*  CREATININE 1.23* 1.40* 1.26* 1.17* 1.06*  CALCIUM 8.2*  --  7.9* 7.5* 7.9*  MG  --   --   --  1.6* 2.1  PHOS  --   --   --  3.8 3.7   Estimated Creatinine Clearance: 58.2 mL/min (A) (by C-G formula based on SCr of 1.06 mg/dL (H)). Liver & Pancreas: Recent Labs  Lab 09/30/22 0800 10/01/22 0419 10/02/22 0419 10/03/22 0403  AST 41 42* 37  --   ALT 24 22 22   --   ALKPHOS 165* 117 108  --   BILITOT 2.3* 1.4* 0.9  --   PROT 6.4* 4.9* 4.8*  --   ALBUMIN 2.6* 2.0* 1.9* 2.2*   No results for input(s): "LIPASE", "AMYLASE" in the last 168 hours. Recent Labs  Lab 09/30/22 0800 10/02/22 0419  AMMONIA 63* 48*   Diabetic: No results for input(s): "HGBA1C" in the last 72 hours. No results for input(s): "GLUCAP" in the last 168 hours. Cardiac Enzymes: No results for input(s): "CKTOTAL", "CKMB", "CKMBINDEX", "TROPONINI" in the last 168 hours. No results for input(s): "PROBNP" in the last 8760 hours. Coagulation Profile: Recent Labs  Lab 09/30/22 0800  INR 1.6*   Thyroid Function Tests: No results for input(s): "TSH", "T4TOTAL", "FREET4", "T3FREE", "THYROIDAB" in the last 72 hours. Lipid Profile: No results for input(s): "CHOL", "HDL", "LDLCALC", "TRIG", "CHOLHDL", "LDLDIRECT" in the last 72 hours. Anemia Panel: No results for input(s): "VITAMINB12", "FOLATE", "FERRITIN", "TIBC", "IRON", "RETICCTPCT" in the last 72 hours. Urine analysis:    Component Value Date/Time   COLORURINE YELLOW 09/30/2022 1126   APPEARANCEUR HAZY (A) 09/30/2022 1126   LABSPEC 1.012 09/30/2022 1126   PHURINE 5.0 09/30/2022 1126   GLUCOSEU  NEGATIVE 09/30/2022 1126   HGBUR LARGE (A) 09/30/2022 1126   BILIRUBINUR NEGATIVE 09/30/2022 1126   KETONESUR NEGATIVE 09/30/2022 1126   PROTEINUR 30 (A) 09/30/2022 1126   UROBILINOGEN 0.2 08/21/2013 0017   NITRITE NEGATIVE 09/30/2022 1126   LEUKOCYTESUR NEGATIVE 09/30/2022 1126   Sepsis Labs: Invalid input(s): "PROCALCITONIN", "  LACTICIDVEN"  Microbiology: Recent Results (from the past 240 hour(s))  SARS Coronavirus 2 by RT PCR (hospital order, performed in [facility] hospital lab) *cepheid single result test* Anterior Nasal Swab     Status: None   Collection Time: 09/30/22  7:45 AM   Specimen: Anterior Nasal Swab  Result Value Ref Range Status   SARS Coronavirus 2 by RT PCR NEGATIVE NEGATIVE Final    Comment: (NOTE) SARS-CoV-2 target nucleic acids are NOT DETECTED.  The SARS-CoV-2 RNA is generally detectable in upper and lower respiratory specimens during the acute phase of infection. The lowest concentration of SARS-CoV-2 viral copies this assay can detect is 250 copies / mL. A negative result does not preclude SARS-CoV-2 infection and should not be used as the sole basis for treatment or other patient management decisions.  A negative result may occur with improper specimen collection / handling, submission of specimen other than nasopharyngeal swab, presence of viral mutation(s) within the areas targeted by this assay, and inadequate number of viral copies (<250 copies / mL). A negative result must be combined with clinical observations, patient history, and epidemiological information.  Fact Sheet for Patients:   RoadLapTop.co.za  Fact Sheet for Healthcare Providers: http://kim-miller.com/  This test is not yet approved or  cleared by the [place] FDA and has been authorized for detection and/or diagnosis of SARS-CoV-2 by FDA under an Emergency Use Authorization (EUA).  This EUA will remain in effect (meaning this test  can be used) for the duration of the COVID-19 declaration under Section 564(b)(1) of the Act, 21 U.S.C. section 360bbb-3(b)(1), unless the authorization is terminated or revoked sooner.  Performed at [facility], [address], [place], [place] [address]   Blood culture (routine x 2)     Status: Abnormal   Collection Time: 09/30/22  9:30 AM   Specimen: BLOOD RIGHT HAND  Result Value Ref Range Status   Specimen Description BLOOD RIGHT HAND  Final   Special Requests   Final    BOTTLES DRAWN AEROBIC AND ANAEROBIC Blood Culture adequate volume   Culture  Setup Time   Final    GRAM NEGATIVE RODS AEROBIC BOTTLE ONLY CRITICAL RESULT CALLED TO, READ BACK BY AND VERIFIED WITH: PHARMD Cherylin Mylar on [id number] @1021H  BY SM    Culture PSEUDOMONAS AERUGINOSA (A)  Final   Report Status 10/03/2022 FINAL  Final   Organism ID, Bacteria PSEUDOMONAS AERUGINOSA  Final      Susceptibility   Pseudomonas aeruginosa - MIC*    CEFTAZIDIME 16 INTERMEDIATE Intermediate     CIPROFLOXACIN 1 INTERMEDIATE Intermediate     GENTAMICIN 8 INTERMEDIATE Intermediate     IMIPENEM 2 SENSITIVE Sensitive     CEFEPIME Value in next row Resistant      RESISTANTPerformed at [facility], [address] [place], [place] [address]    * PSEUDOMONAS AERUGINOSA  Blood Culture ID Panel (Reflexed)     Status: Abnormal   Collection Time: 09/30/22  9:30 AM  Result Value Ref Range Status   Enterococcus faecalis NOT DETECTED NOT DETECTED Final   Enterococcus Faecium NOT DETECTED NOT DETECTED Final   Listeria monocytogenes NOT DETECTED NOT DETECTED Final   Staphylococcus species NOT DETECTED NOT DETECTED Final   Staphylococcus aureus (BCID) NOT DETECTED NOT DETECTED Final   Staphylococcus epidermidis NOT DETECTED NOT DETECTED Final   Staphylococcus lugdunensis NOT DETECTED NOT DETECTED Final   Streptococcus species NOT DETECTED NOT DETECTED Final   Streptococcus agalactiae NOT DETECTED  NOT DETECTED  Final   Streptococcus pneumoniae NOT DETECTED NOT DETECTED Final   Streptococcus pyogenes NOT DETECTED NOT DETECTED Final   A.calcoaceticus-baumannii NOT DETECTED NOT DETECTED Final   Bacteroides fragilis NOT DETECTED NOT DETECTED Final   Enterobacterales NOT DETECTED NOT DETECTED Final   Enterobacter cloacae complex NOT DETECTED NOT DETECTED Final   Escherichia coli NOT DETECTED NOT DETECTED Final   Klebsiella aerogenes NOT DETECTED NOT DETECTED Final   Klebsiella oxytoca NOT DETECTED NOT DETECTED Final   Klebsiella pneumoniae NOT DETECTED NOT DETECTED Final   Proteus species NOT DETECTED NOT DETECTED Final   Salmonella species NOT DETECTED NOT DETECTED Final   Serratia marcescens NOT DETECTED NOT DETECTED Final   Haemophilus influenzae NOT DETECTED NOT DETECTED Final   Neisseria meningitidis NOT DETECTED NOT DETECTED Final   Pseudomonas aeruginosa (A) NOT DETECTED Final    CRITICAL RESULT CALLED TO, READ BACK BY AND VERIFIED WITH:    Comment: PHARMD S.DAVIS AT 1021 ON 10/01/2022 BY SM. DETECTED    Stenotrophomonas maltophilia NOT DETECTED NOT DETECTED Final   Candida albicans NOT DETECTED NOT DETECTED Final   Candida auris NOT DETECTED NOT DETECTED Final   Candida glabrata NOT DETECTED NOT DETECTED Final   Candida krusei NOT DETECTED NOT DETECTED Final   Candida parapsilosis NOT DETECTED NOT DETECTED Final   Candida tropicalis NOT DETECTED NOT DETECTED Final   Cryptococcus neoformans/gattii NOT DETECTED NOT DETECTED Final   CTX-M ESBL NOT DETECTED NOT DETECTED Final   Carbapenem resistance IMP NOT DETECTED NOT DETECTED Final   Carbapenem resistance KPC NOT DETECTED NOT DETECTED Final   Carbapenem resistance NDM NOT DETECTED NOT DETECTED Final   Carbapenem resist OXA 48 LIKE NOT DETECTED NOT DETECTED Final   Carbapenem resistance VIM NOT DETECTED NOT DETECTED Final    Comment: Performed at [facility], [address], [place], [place] [address]  Blood culture (routine x 2)      Status: Abnormal   Collection Time: 09/30/22  9:49 AM   Specimen: BLOOD LEFT ARM  Result Value Ref Range Status   Specimen Description   Final    BLOOD LEFT ARM Performed at [facility], [address] [place], [place] [address]    Special Requests   Final    BOTTLES DRAWN AEROBIC AND ANAEROBIC Blood Culture adequate volume Performed at [facility], [address] [place], [place] [address]    Culture  Setup Time   Final    GRAM NEGATIVE RODS AEROBIC BOTTLE ONLY CRITICAL VALUE NOTED.  VALUE IS CONSISTENT WITH PREVIOUSLY REPORTED AND CALLED VALUE.    Culture (A)  Final    PSEUDOMONAS AERUGINOSA SUSCEPTIBILITIES PERFORMED ON PREVIOUS CULTURE WITHIN THE LAST 5 DAYS. Performed at [facility], [address] [place], [place] [address]    Report Status 10/03/2022 FINAL  Final  Urine Culture     Status: None   Collection Time: 09/30/22 11:26 AM   Specimen: Urine, Random  Result Value Ref Range Status   Specimen Description   Final    URINE, RANDOM Performed at [facility], [address], [place], [place] [address]    Special Requests   Final    NONE Reflexed from [id number] Performed at [facility], [address] [place], [place] [address]    Culture   Final    NO GROWTH Performed at [facility], [address], [place], [place] [address]    Report  Status 10/01/2022 FINAL  Final    Radiology Studies: No results found.    Johnye Kist T. Cleola Perryman [facility]  If 7PM-7AM, please contact night-coverage [email or web address] 10/04/2022, 12:33 PM

## 2022-10-04 NOTE — Progress Notes (Signed)
Pt transferred to 1520 from 4W.  Writer called patient's son, Mayrim Crayne, and made him aware.

## 2022-10-05 ENCOUNTER — Encounter: Payer: Medicare (Managed Care) | Admitting: Obstetrics and Gynecology

## 2022-10-05 DIAGNOSIS — A4152 Sepsis due to Pseudomonas: Principal | ICD-10-CM

## 2022-10-05 DIAGNOSIS — Z66 Do not resuscitate: Secondary | ICD-10-CM | POA: Insufficient documentation

## 2022-10-05 DIAGNOSIS — N939 Abnormal uterine and vaginal bleeding, unspecified: Secondary | ICD-10-CM

## 2022-10-05 DIAGNOSIS — E7220 Disorder of urea cycle metabolism, unspecified: Secondary | ICD-10-CM | POA: Diagnosis not present

## 2022-10-05 DIAGNOSIS — G9341 Metabolic encephalopathy: Secondary | ICD-10-CM | POA: Diagnosis not present

## 2022-10-05 DIAGNOSIS — A419 Sepsis, unspecified organism: Secondary | ICD-10-CM | POA: Diagnosis not present

## 2022-10-05 LAB — RENAL FUNCTION PANEL
Albumin: 2.1 g/dL — ABNORMAL LOW (ref 3.5–5.0)
Anion gap: 7 (ref 5–15)
BUN: 23 mg/dL (ref 8–23)
CO2: 18 mmol/L — ABNORMAL LOW (ref 22–32)
Calcium: 7.8 mg/dL — ABNORMAL LOW (ref 8.9–10.3)
Chloride: 110 mmol/L (ref 98–111)
Creatinine, Ser: 0.88 mg/dL (ref 0.44–1.00)
GFR, Estimated: >60 mL/min (ref >60)
Glucose, Bld: 100 mg/dL — ABNORMAL HIGH (ref 70–99)
Phosphorus: 2.9 mg/dL (ref 2.5–4.6)
Potassium: 3.7 mmol/L (ref 3.5–5.1)
Sodium: 135 mmol/L (ref 135–145)

## 2022-10-05 LAB — CBC
HCT: 28.2 % — ABNORMAL LOW (ref 36.0–46.0)
Hemoglobin: 8.3 g/dL — ABNORMAL LOW (ref 12.0–15.0)
MCH: 30.3 pg (ref 26.0–34.0)
MCHC: 29.4 g/dL — ABNORMAL LOW (ref 30.0–36.0)
MCV: 102.9 fL — ABNORMAL HIGH (ref 80.0–100.0)
Platelets: 139 10*3/uL — ABNORMAL LOW (ref 150–400)
RBC: 2.74 MIL/uL — ABNORMAL LOW (ref 3.87–5.11)
RDW: 16.8 % — ABNORMAL HIGH (ref 11.5–15.5)
WBC: 6.2 10*3/uL (ref 4.0–10.5)
nRBC: 0 % (ref 0.0–0.2)

## 2022-10-05 LAB — MAGNESIUM: Magnesium: 1.8 mg/dL (ref 1.7–2.4)

## 2022-10-05 NOTE — Assessment & Plan Note (Signed)
Stable

## 2022-10-05 NOTE — Progress Notes (Signed)
PROGRESS NOTE    [name]  WUJ:[id number] DOB: 02-05-1955 DOA: 09/30/2022 PCP: [name], MD  Subjective: Pt seen and examined. Pt walking out of bathroom with OT. Using walker. Son([name]) lying on couch. Pt feeling well now. ID consult reviewed. Will need 10 days of IV meropenem for pseudomonas septicemia from blood cx. Urine cx negative.  Son states before pt got sick pt had washed her hands in a sink that had old copper pipes. He wonders if this is where she got the infection.    Hospital Course: HPI: [name] is a 67 y.o. female with medical history significant of chronic diarrhea, nonalcoholic liver cirrhosis, heart murmur, hypertension, cholelithiasis, class III obesity, pulmonary embolism, short gut syndrome, hypothyroidism who presented to the emergency department due to altered mental status.  Per patient's son, she started acting incoherently this morning but is now closer to baseline.  She usually does these mental changes when she has an UTI or hyperammonemia.  She is currently complaining of back pain but denied any other complaints.   100.1 F, pulse 7016, respirations 32, BP 98/59 mmHg O2 sat 95% on room air.  The patient received 2000 mL of LR bolus, 500 mL of normal saline bolus followed by LR at 150 mL/h and ceftriaxone 2 g IVPB.  Subsequently, the maintenance fluids were held due to concerns of pulmonary edema and resolution of hypotension.  Significant Events: Admitted 09/30/2022 for acute metabolic encephalopathy, sepsis from UTI   Significant Labs: Admitting WBC 15.2 Admitting blood cx 09-30-2022 growing ESBL pseudomonas  Significant Imaging Studies:   Antibiotic Therapy: Anti-infectives (From admission, onward)    Start     Dose/Rate Route Frequency Ordered Stop   10/03/22 1100  meropenem (MERREM) 1 g in sodium chloride 0.9 % 100 mL IVPB        1 g 200 mL/hr over 30 Minutes Intravenous Every 8 hours 10/03/22 1014     10/01/22 1130  ceFEPIme  (MAXIPIME) 2 g in sodium chloride 0.9 % 100 mL IVPB  Status:  Discontinued        2 g 200 mL/hr over 30 Minutes Intravenous Every 8 hours 10/01/22 1043 10/03/22 1014   09/30/22 2200  rifaximin (XIFAXAN) tablet 550 mg        550 mg Oral 2 times daily 09/30/22 1805     09/30/22 0830  cefTRIAXone (ROCEPHIN) 2 g in sodium chloride 0.9 % 100 mL IVPB  Status:  Discontinued        2 g 200 mL/hr over 30 Minutes Intravenous Every 24 hours 09/30/22 0823 10/01/22 1043       Procedures:   Consultants: Infectious Disease    Assessment and Plan: * Severe sepsis [facility]) Admitted for severe sepsis due to UTI. Now resolved.  Septicemia due to Pseudomonas (HCC) - ESBL On IV meropenem. Will need 10 days of IV Abx. End date 10-11-2021. Pt and family unable to manage home IV Abx. Will need to stay inpatient to complete IV ABX course.  Acute metabolic encephalopathy Due to severe sepsis and UTI.  Morbid obesity (HCC) BMI 39.4  Short gut syndrome Has chronic diarrhea.  Hyperammonemia (HCC) Stable. Resolved  Hypotension Due to severe sepsis and UTI. Resolved with IVF.  Hypothyroidism Stable. On synthroid 150 mcg.  Thrombocytopenia (HCC) Resolved. Due to sepsis.  GERD (gastroesophageal reflux disease) Stable. On PPI.  Iron deficiency anemia Stable.  Cirrhosis due to NASH Stable.  Vaginal bleeding Pt with hx of uterine fibroids. Last dose of lovenox on  10-03-2022. Will wait for lovenox to wear off and re-assess before consulting GYN.  DNR (do not resuscitate) Pt is DNR/DNI.   DVT prophylaxis: Place and maintain sequential compression device Start: 10/04/22 1234 SCDs Start: 09/30/22 1241    Code Status: Do not attempt resuscitation (DNR) PRE-ARREST INTERVENTIONS DESIRED Family Communication: discussed with pt's son [name] at bedside Disposition Plan: return home Reason for continuing need for hospitalization: will need IV ABX through 10-12-2022.  Objective: Vitals:   10/04/22  2032 10/04/22 2246 10/05/22 0547 10/05/22 0753  BP: 132/82 124/77 (!) 123/56 108/67  Pulse: 69 86 81 95  Resp: 17 20 18    Temp: 98.5 F (36.9 C) 98.9 F (37.2 C) 98.4 F (36.9 C) 98 F (36.7 C)  TempSrc: Oral     SpO2: 100% 99% 98% 98%  Weight:   97.7 kg   Height:        Intake/Output Summary (Last 24 hours) at 10/05/2022 1422 Last data filed at 10/04/2022 2200 Gross per 24 hour  Intake 386.54 ml  Output --  Net 386.54 ml   Filed Weights   09/30/22 0722 10/03/22 0500 10/05/22 0547  Weight: (!) 144 kg 99.1 kg 97.7 kg    Examination:  Physical Exam Vitals and nursing note reviewed.  Constitutional:      General: She is not in acute distress.    Appearance: She is obese. She is not toxic-appearing or diaphoretic.  HENT:     Head: Normocephalic and atraumatic.     Nose: Nose normal.  Eyes:     General: No scleral icterus.    Comments: Cloudy left corneal  Cardiovascular:     Rate and Rhythm: Normal rate and regular rhythm.  Pulmonary:     Effort: Pulmonary effort is normal.     Breath sounds: Normal breath sounds.  Abdominal:     General: Bowel sounds are normal. There is no distension.  Skin:    General: Skin is warm and dry.     Capillary Refill: Capillary refill takes less than 2 seconds.  Neurological:     Mental Status: She is alert and oriented to person, place, and time.     Gait: Gait normal.     Data Reviewed: I have personally reviewed following labs and imaging studies  CBC: Recent Labs  Lab 09/30/22 0800 09/30/22 0817 10/01/22 0419 10/02/22 0419 10/03/22 0403 10/05/22 0609  WBC 15.2*  --  12.6* 6.9 6.2 6.2  NEUTROABS 13.4*  --   --   --   --   --   HGB 9.6* 8.8* 8.0* 7.4* 8.0* 8.3*  HCT 30.5* 26.0* 25.3* 23.9* 26.8* 28.2*  MCV 96.5  --  97.3 99.2 100.4* 102.9*  PLT 136*  --  117* 106* 123* 139*   Basic Metabolic Panel: Recent Labs  Lab 09/30/22 0800 09/30/22 0817 10/01/22 0419 10/02/22 0419 10/03/22 0403 10/05/22 0609  NA 136  140 133* 134* 135 135  K 3.8 3.9 3.5 3.2* 3.5 3.7  CL 105 105 105 105 109 110  CO2 19*  --  21* 22 20* 18*  GLUCOSE 108* 101* 95 124* 123* 100*  BUN 25* 26* 26* 29* 28* 23  CREATININE 1.23* 1.40* 1.26* 1.17* 1.06* 0.88  CALCIUM 8.2*  --  7.9* 7.5* 7.9* 7.8*  MG  --   --   --  1.6* 2.1 1.8  PHOS  --   --   --  3.8 3.7 2.9   GFR: Estimated Creatinine Clearance: 69.5 mL/min (by  C-G formula based on SCr of 0.88 mg/dL). Liver Function Tests: Recent Labs  Lab 09/30/22 0800 10/01/22 0419 10/02/22 0419 10/03/22 0403 10/05/22 0609  AST 41 42* 37  --   --   ALT 24 22 22   --   --   ALKPHOS 165* 117 108  --   --   BILITOT 2.3* 1.4* 0.9  --   --   PROT 6.4* 4.9* 4.8*  --   --   ALBUMIN 2.6* 2.0* 1.9* 2.2* 2.1*    Recent Labs  Lab 09/30/22 0800 10/02/22 0419  AMMONIA 63* 48*   Coagulation Profile: Recent Labs  Lab 09/30/22 0800  INR 1.6*   Sepsis Labs: Recent Labs  Lab 09/30/22 0817 09/30/22 1306 09/30/22 1631 10/01/22 0419  LATICACIDVEN 3.8* 3.4* 3.3* 1.3    Recent Results (from the past 240 hour(s))  SARS Coronavirus 2 by RT PCR (hospital order, performed in [facility] hospital lab) *cepheid single result test* Anterior Nasal Swab     Status: None   Collection Time: 09/30/22  7:45 AM   Specimen: Anterior Nasal Swab  Result Value Ref Range Status   SARS Coronavirus 2 by RT PCR NEGATIVE NEGATIVE Final    Comment: (NOTE) SARS-CoV-2 target nucleic acids are NOT DETECTED.  The SARS-CoV-2 RNA is generally detectable in upper and lower respiratory specimens during the acute phase of infection. The lowest concentration of SARS-CoV-2 viral copies this assay can detect is 250 copies / mL. A negative result does not preclude SARS-CoV-2 infection and should not be used as the sole basis for treatment or other patient management decisions.  A negative result may occur with improper specimen collection / handling, submission of specimen other than nasopharyngeal swab,  presence of viral mutation(s) within the areas targeted by this assay, and inadequate number of viral copies (<250 copies / mL). A negative result must be combined with clinical observations, patient history, and epidemiological information.  Fact Sheet for Patients:   RoadLapTop.co.za  Fact Sheet for Healthcare Providers: http://kim-miller.com/  This test is not yet approved or  cleared by the [place] FDA and has been authorized for detection and/or diagnosis of SARS-CoV-2 by FDA under an Emergency Use Authorization (EUA).  This EUA will remain in effect (meaning this test can be used) for the duration of the COVID-19 declaration under Section 564(b)(1) of the Act, 21 U.S.C. section 360bbb-3(b)(1), unless the authorization is terminated or revoked sooner.  Performed at [facility], [address] [place], [place] [address]   Blood culture (routine x 2)     Status: Abnormal   Collection Time: 09/30/22  9:30 AM   Specimen: BLOOD RIGHT HAND  Result Value Ref Range Status   Specimen Description BLOOD RIGHT HAND  Final   Special Requests   Final    BOTTLES DRAWN AEROBIC AND ANAEROBIC Blood Culture adequate volume   Culture  Setup Time   Final    GRAM NEGATIVE RODS AEROBIC BOTTLE ONLY CRITICAL RESULT CALLED TO, READ BACK BY AND VERIFIED WITH: PHARMD Cherylin Mylar on [id number] @1021H  BY SM    Culture PSEUDOMONAS AERUGINOSA (A)  Final   Report Status 10/03/2022 FINAL  Final   Organism ID, Bacteria PSEUDOMONAS AERUGINOSA  Final      Susceptibility   Pseudomonas aeruginosa - MIC*    CEFTAZIDIME 16 INTERMEDIATE Intermediate     CIPROFLOXACIN 1 INTERMEDIATE Intermediate     GENTAMICIN 8 INTERMEDIATE Intermediate     IMIPENEM 2 SENSITIVE Sensitive  CEFEPIME Value in next row Resistant      RESISTANTPerformed at [facility], [address], [place], [place] [address]    * PSEUDOMONAS AERUGINOSA  Blood  Culture ID Panel (Reflexed)     Status: Abnormal   Collection Time: 09/30/22  9:30 AM  Result Value Ref Range Status   Enterococcus faecalis NOT DETECTED NOT DETECTED Final   Enterococcus Faecium NOT DETECTED NOT DETECTED Final   Listeria monocytogenes NOT DETECTED NOT DETECTED Final   Staphylococcus species NOT DETECTED NOT DETECTED Final   Staphylococcus aureus (BCID) NOT DETECTED NOT DETECTED Final   Staphylococcus epidermidis NOT DETECTED NOT DETECTED Final   Staphylococcus lugdunensis NOT DETECTED NOT DETECTED Final   Streptococcus species NOT DETECTED NOT DETECTED Final   Streptococcus agalactiae NOT DETECTED NOT DETECTED Final   Streptococcus pneumoniae NOT DETECTED NOT DETECTED Final   Streptococcus pyogenes NOT DETECTED NOT DETECTED Final   A.calcoaceticus-baumannii NOT DETECTED NOT DETECTED Final   Bacteroides fragilis NOT DETECTED NOT DETECTED Final   Enterobacterales NOT DETECTED NOT DETECTED Final   Enterobacter cloacae complex NOT DETECTED NOT DETECTED Final   Escherichia coli NOT DETECTED NOT DETECTED Final   Klebsiella aerogenes NOT DETECTED NOT DETECTED Final   Klebsiella oxytoca NOT DETECTED NOT DETECTED Final   Klebsiella pneumoniae NOT DETECTED NOT DETECTED Final   Proteus species NOT DETECTED NOT DETECTED Final   Salmonella species NOT DETECTED NOT DETECTED Final   Serratia marcescens NOT DETECTED NOT DETECTED Final   Haemophilus influenzae NOT DETECTED NOT DETECTED Final   Neisseria meningitidis NOT DETECTED NOT DETECTED Final   Pseudomonas aeruginosa (A) NOT DETECTED Final    CRITICAL RESULT CALLED TO, READ BACK BY AND VERIFIED WITH:    Comment: PHARMD S.DAVIS AT 1021 ON 10/01/2022 BY SM. DETECTED    Stenotrophomonas maltophilia NOT DETECTED NOT DETECTED Final   Candida albicans NOT DETECTED NOT DETECTED Final   Candida auris NOT DETECTED NOT DETECTED Final   Candida glabrata NOT DETECTED NOT DETECTED Final   Candida krusei NOT DETECTED NOT DETECTED Final    Candida parapsilosis NOT DETECTED NOT DETECTED Final   Candida tropicalis NOT DETECTED NOT DETECTED Final   Cryptococcus neoformans/gattii NOT DETECTED NOT DETECTED Final   CTX-M ESBL NOT DETECTED NOT DETECTED Final   Carbapenem resistance IMP NOT DETECTED NOT DETECTED Final   Carbapenem resistance KPC NOT DETECTED NOT DETECTED Final   Carbapenem resistance NDM NOT DETECTED NOT DETECTED Final   Carbapenem resist OXA 48 LIKE NOT DETECTED NOT DETECTED Final   Carbapenem resistance VIM NOT DETECTED NOT DETECTED Final    Comment: Performed at [facility], [address], [place], [place] [address]  Blood culture (routine x 2)     Status: Abnormal   Collection Time: 09/30/22  9:49 AM   Specimen: BLOOD LEFT ARM  Result Value Ref Range Status   Specimen Description   Final    BLOOD LEFT ARM Performed at [facility], [address], [place], [place] [address]    Special Requests   Final    BOTTLES DRAWN AEROBIC AND ANAEROBIC Blood Culture adequate volume Performed at [facility], [address] [place], [place] [address]    Culture  Setup Time   Final    GRAM NEGATIVE RODS AEROBIC BOTTLE ONLY CRITICAL VALUE NOTED.  VALUE IS CONSISTENT WITH PREVIOUSLY REPORTED AND CALLED VALUE.    Culture (A)  Final    PSEUDOMONAS AERUGINOSA SUSCEPTIBILITIES PERFORMED ON PREVIOUS CULTURE WITHIN  THE LAST 5 DAYS. Performed at [facility], [address], [place], [place] [address]    Report Status 10/03/2022 FINAL  Final  Urine Culture     Status: None   Collection Time: 09/30/22 11:26 AM   Specimen: Urine, Random  Result Value Ref Range Status   Specimen Description   Final    URINE, RANDOM Performed at [facility], [address] [place], [place] [address]    Special Requests   Final    NONE Reflexed from [id number] Performed at [facility], [address], [place], [place] [address]    Culture   Final    NO  GROWTH Performed at [facility], [address] [place], [place] [address]    Report Status 10/01/2022 FINAL  Final  Culture, blood (Routine X 2) w Reflex to ID Panel     Status: None (Preliminary result)   Collection Time: 10/04/22 10:18 AM   Specimen: BLOOD  Result Value Ref Range Status   Specimen Description   Final    BLOOD BLOOD RIGHT HAND Performed at [facility], [address], [place], [place] [address]    Special Requests   Final    BOTTLES DRAWN AEROBIC ONLY Blood Culture adequate volume Performed at [facility], [address] [place], [place] [address]    Culture   Final    NO GROWTH < 24 HOURS Performed at [facility], [address] [place], [place] [address]    Report Status PENDING  Incomplete  Culture, blood (Routine X 2) w Reflex to ID Panel     Status: None (Preliminary result)   Collection Time: 10/04/22 10:19 AM   Specimen: BLOOD  Result Value Ref Range Status   Specimen Description   Final    BLOOD SITE NOT SPECIFIED Performed at [facility], [address] [place], [place] [address]    Special Requests   Final    BOTTLES DRAWN AEROBIC AND ANAEROBIC Blood Culture adequate volume Performed at [facility], [address], [place], [place] [address]    Culture   Final    NO GROWTH < 24 HOURS Performed at [facility], [address], [place], [place] [address]    Report Status PENDING  Incomplete     Radiology Studies: No results found.  Scheduled Meds:  atorvastatin  40 mg Oral q AM   cyanocobalamin  2,000 mcg Oral Q Fri   folic acid  1 mg Oral Daily   furosemide  40 mg Oral BID    HYDROmorphone (DILAUDID) injection  1 mg Intravenous Once   lactulose  30 g Oral BID   levothyroxine  150 mcg Oral QAC breakfast   magnesium gluconate  500 mg Oral Daily   midodrine  10 mg Oral TID WC   nystatin cream   Topical BID   pantoprazole  40 mg Oral BID  AC   rifaximin  550 mg Oral BID   rOPINIRole  0.25 mg Oral QHS   spironolactone  25 mg Oral Daily   tamsulosin  0.4 mg Oral Daily   venlafaxine XR  75 mg Oral Q breakfast   Continuous Infusions:  meropenem (MERREM) IV 1 g (10/05/22 1405)     LOS: 5 days   Time spent: 45 minutes  Carollee Herter, DO  [facility]  10/05/2022, 2:22 PM

## 2022-10-05 NOTE — Progress Notes (Signed)
Occupational Therapy Treatment Patient Details Name: [name] MRN: [id number] DOB: [birth year] Today's Date: 10/05/2022   History of present illness 67 year old female with PMH of NASH cirrhosis, chronic diarrhea, short gut syndrome, PE/DVT not on AC, morbid obesity, HTN, systolic murmur, hypothyroidism and chronic hypotension presenting with altered mental status and admitted with working diagnosis of sepsis due to urinary tract infection.   OT comments  Patient and son who was present in room endorsing that patient is at baseline for Adls. Patients son reported that patient has supervision for Adls at home at [facility] level. Patient able to complete ADL tasks in room at supervision level standing at sink to complete grooming tasks and transfers in and out of bathroom. Patient reported she does not wear socks at home and typically wears slide on shoes. Patient appears to be at baseline for ADLs. OT to sign off at this time.       If plan is discharge home, recommend the following:  Help with stairs or ramp for entrance;Assist for transportation;A little help with bathing/dressing/bathroom;A little help with walking and/or transfers;Assistance with cooking/housework;Direct supervision/assist for medications management   Equipment Recommendations  None recommended by OT       Precautions / Restrictions Precautions Precautions: Fall Precaution Comments: recent T3, T5 comp fxs Restrictions Weight Bearing Restrictions: No Other Position/Activity Restrictions: contact precautions              ADL either performed or assessed with clinical judgement   ADL Overall ADL's : At baseline                       Lower Body Dressing Details (indicate cue type and reason): patient reported that she does not wear socks at home she has slide on shoes. son present in room endorsed the same. Toilet Transfer: Ambulation;Rolling walker (2 wheels);Regular Toilet;Grab  bars;Supervision/safety Toilet Transfer Details (indicate cue type and reason): patient did not need any physical A. patient was able to stand in room to speak with MD some and wait for therapist to set up recliner in room.           General ADL Comments: son present in room endorsing that patient is at baseline for ADls that he provides supervision for mother at baseline at home.      Cognition Arousal: Alert Behavior During Therapy: Flat affect Overall Cognitive Status: Within Functional Limits for tasks assessed                       Pertinent Vitals/ Pain       Pain Assessment Pain Assessment: No/denies pain         Frequency  Min 1X/week        Progress Toward Goals  OT Goals(current goals can now be found in the care plan section)  Progress towards OT goals: Goals met/education completed, patient discharged from OT     Plan         AM-PAC OT "6 Clicks" Daily Activity     Outcome Measure   Help from another person eating meals?: None Help from another person taking care of personal grooming?: None Help from another person toileting, which includes using toliet, bedpan, or urinal?: A Little Help from another person bathing (including washing, rinsing, drying)?: A Little Help from another person to put on and taking off regular upper body clothing?: None Help from another person to put on and taking off regular lower body clothing?: A Little  6 Click Score: 21    End of Session Equipment Utilized During Treatment: Rolling walker (2 wheels)  OT Visit Diagnosis: Muscle weakness (generalized) (M62.81);Unsteadiness on feet (R26.81)   Activity Tolerance Patient tolerated treatment well   Patient Left with call bell/phone within reach;in chair   Nurse Communication Mobility status        Time: 0981-1914 OT Time Calculation (min): 14 min  Charges: OT General Charges $OT Visit: 1 Visit OT Treatments $Self Care/Home Management : 8-22  mins  Rosalio Loud, MS Acute Rehabilitation Department Office# [phone]   Selinda Flavin 10/05/2022, 11:33 AM

## 2022-10-05 NOTE — Hospital Course (Addendum)
HPI: [name] is a 67 y.o. female with medical history significant of chronic diarrhea, nonalcoholic liver cirrhosis, heart murmur, hypertension, cholelithiasis, class III obesity, pulmonary embolism, short gut syndrome, hypothyroidism who presented to the emergency department due to altered mental status.  Per patient's son, she started acting incoherently this morning but is now closer to baseline.  She usually does these mental changes when she has an UTI or hyperammonemia.  She is currently complaining of back pain but denied any other complaints.   100.1 F, pulse 7016, respirations 32, BP 98/59 mmHg O2 sat 95% on room air.  The patient received 2000 mL of LR bolus, 500 mL of normal saline bolus followed by LR at 150 mL/h and ceftriaxone 2 g IVPB.  Subsequently, the maintenance fluids were held due to concerns of pulmonary edema and resolution of hypotension.  Significant Events: Admitted 09/30/2022 for acute metabolic encephalopathy, sepsis from UTI 10-07-2022 pt still having vaginal bleeding over the last 3 days. DVT prophylaxis Lovenox stopped on 10-03-2022. Pt's [name] requested GYN consult. 10-07-2022 started Megace 40 mg bid for vaginal bleeding.  Significant Labs: Admitting WBC 15.2 Admitting blood cx 09-30-2022 growing ESBL pseudomonas 10-05-2022 HgB 8.3 g/dl 1-61-0960 HgB 7.3 g/dl 4-54-0981 HGB 7.4 d/gl 10-04-2022 repeat blood cultures are negative - final result  Significant Imaging Studies:   Antibiotic Therapy: Anti-infectives (From admission, onward)    Start     Dose/Rate Route Frequency Ordered Stop   10/03/22 1100  meropenem (MERREM) 1 g in sodium chloride 0.9 % 100 mL IVPB        1 g 200 mL/hr over 30 Minutes Intravenous Every 8 hours 10/03/22 1014 10/11/22 0550   10/01/22 1130  ceFEPIme (MAXIPIME) 2 g in sodium chloride 0.9 % 100 mL IVPB  Status:  Discontinued        2 g 200 mL/hr over 30 Minutes Intravenous Every 8 hours 10/01/22 1043 10/03/22 1014   09/30/22  2200  rifaximin (XIFAXAN) tablet 550 mg        550 mg Oral 2 times daily 09/30/22 1805     09/30/22 0830  cefTRIAXone (ROCEPHIN) 2 g in sodium chloride 0.9 % 100 mL IVPB  Status:  Discontinued        2 g 200 mL/hr over 30 Minutes Intravenous Every 24 hours 09/30/22 0823 10/01/22 1043       Procedures:   Consultants: Infectious Disease GYN - for vaginal bleeding

## 2022-10-05 NOTE — Assessment & Plan Note (Addendum)
Due to severe sepsis and UTI. Resolved. Pt is AxOx4

## 2022-10-05 NOTE — Assessment & Plan Note (Signed)
Stable. On synthroid 150 mcg.

## 2022-10-05 NOTE — Assessment & Plan Note (Signed)
Stable. Resolved

## 2022-10-05 NOTE — Subjective & Objective (Addendum)
Pt seen and examined. Pt is stable. Pt's [name] not at bedside.  Pt has completed 10 days of IV meropenem for pseudomonas septicemia from blood cx. Urine cx negative.  Pt states her vaginal bleeding has improved on on Megace 40 mg bid.  Pt to f/u with outpatient GYN on 11-03-2022 with Dr. Briscoe Deutscher as scheduled.  Overall, doing well. Walked 200 feet today.

## 2022-10-05 NOTE — Assessment & Plan Note (Signed)
Due to severe sepsis and UTI. Resolved with IVF.

## 2022-10-05 NOTE — Assessment & Plan Note (Signed)
Resolved. Due to sepsis.

## 2022-10-05 NOTE — Progress Notes (Signed)
Mobility Specialist - Progress Note   10/05/22 1257  Mobility  Activity Ambulated with assistance in hallway  Level of Assistance Standby assist, set-up cues, supervision of patient - no hands on  Assistive Device Front wheel walker  Distance Ambulated (ft) 300 ft  Range of Motion/Exercises Active  Activity Response Tolerated well  Mobility Referral Yes  $Mobility charge 1 Mobility  Mobility Specialist Start Time (ACUTE ONLY) 1245  Mobility Specialist Stop Time (ACUTE ONLY) 1255  Mobility Specialist Time Calculation (min) (ACUTE ONLY) 10 min   Pt received in bed and agreed to mobility. Had no issues throughout session, returned to chair with all needs met.  Marilynne Halsted Mobility Specialist

## 2022-10-05 NOTE — Assessment & Plan Note (Addendum)
On IV meropenem. Will need 10 days of IV Abx. Completed on 10-11-2022.  Repeat blood cx on 10-04-2022 are negative  Called pt's [name] today. Pt ready for DC in the AM. Pt's son works for food delivery [employer]. He is working today. Will schedule discharge for early AM tomorrow. Have sent Rx to local pharmacy today.

## 2022-10-05 NOTE — Assessment & Plan Note (Signed)
Stable  On PPI

## 2022-10-05 NOTE — Assessment & Plan Note (Signed)
Has chronic diarrhea.

## 2022-10-05 NOTE — Assessment & Plan Note (Signed)
Admitted for severe sepsis due to UTI. Now resolved.

## 2022-10-05 NOTE — Assessment & Plan Note (Signed)
Pt is DNR/DNI.

## 2022-10-05 NOTE — Assessment & Plan Note (Addendum)
Pt with hx of uterine fibroids. Last dose of lovenox on 10-03-2022. [name] says that pt still having some vaginal bleeding. Son requesting GYN consult since pt missed her outpatient appointment. HgB down to 7.4 today. Was 8.3 g/dl on 09-08-5619.

## 2022-10-05 NOTE — Plan of Care (Signed)

## 2022-10-05 NOTE — Assessment & Plan Note (Signed)
BMI 39.4

## 2022-10-06 DIAGNOSIS — G9341 Metabolic encephalopathy: Secondary | ICD-10-CM | POA: Diagnosis not present

## 2022-10-06 DIAGNOSIS — A419 Sepsis, unspecified organism: Secondary | ICD-10-CM | POA: Diagnosis not present

## 2022-10-06 DIAGNOSIS — K219 Gastro-esophageal reflux disease without esophagitis: Secondary | ICD-10-CM | POA: Diagnosis not present

## 2022-10-06 DIAGNOSIS — A4152 Sepsis due to Pseudomonas: Secondary | ICD-10-CM | POA: Diagnosis not present

## 2022-10-06 LAB — COMPREHENSIVE METABOLIC PANEL
ALT: 33 U/L (ref 0–44)
AST: 53 U/L — ABNORMAL HIGH (ref 15–41)
Albumin: 2 g/dL — ABNORMAL LOW (ref 3.5–5.0)
Alkaline Phosphatase: 106 U/L (ref 38–126)
Anion gap: 6 (ref 5–15)
BUN: 22 mg/dL (ref 8–23)
CO2: 20 mmol/L — ABNORMAL LOW (ref 22–32)
Calcium: 7.8 mg/dL — ABNORMAL LOW (ref 8.9–10.3)
Chloride: 111 mmol/L (ref 98–111)
Creatinine, Ser: 0.86 mg/dL (ref 0.44–1.00)
GFR, Estimated: >60 mL/min (ref >60)
Glucose, Bld: 100 mg/dL — ABNORMAL HIGH (ref 70–99)
Potassium: 3.3 mmol/L — ABNORMAL LOW (ref 3.5–5.1)
Sodium: 137 mmol/L (ref 135–145)
Total Bilirubin: 1.3 mg/dL — ABNORMAL HIGH (ref 0.3–1.2)
Total Protein: 5.2 g/dL — ABNORMAL LOW (ref 6.5–8.1)

## 2022-10-06 LAB — CULTURE, BLOOD (ROUTINE X 2)
Special Requests: ADEQUATE
Special Requests: ADEQUATE

## 2022-10-06 LAB — MAGNESIUM: Magnesium: 1.6 mg/dL — ABNORMAL LOW (ref 1.7–2.4)

## 2022-10-06 MED ORDER — SODIUM CHLORIDE 0.9 % IV SOLN: INTRAVENOUS | Status: DC | PRN

## 2022-10-06 NOTE — Progress Notes (Signed)
Mobility Specialist - Progress Note   10/06/22 1421  Mobility  Activity Ambulated with assistance in hallway  Level of Assistance Standby assist, set-up cues, supervision of patient - no hands on  Assistive Device Front wheel walker  Distance Ambulated (ft) 275 ft  Activity Response Tolerated well  Mobility Referral Yes  $Mobility charge 1 Mobility  Mobility Specialist Start Time (ACUTE ONLY) 0203  Mobility Specialist Stop Time (ACUTE ONLY) 0220  Mobility Specialist Time Calculation (min) (ACUTE ONLY) 17 min   Pt received in bed and agreeable to mobility. No complaints during session. Pt to bed after session with all needs met.    [facility]

## 2022-10-06 NOTE — Progress Notes (Signed)
PROGRESS NOTE    [name]  WJX:[id number] DOB: 1955/04/10 DOA: 09/30/2022 PCP: [name], MD  Subjective: Pt seen and examined. Pt is stable. Pt's son [name] at bedside.  Pt will need 10 days of IV meropenem for pseudomonas septicemia from blood cx. Urine cx negative.   Hospital Course: HPI: [name] is a 66 y.o. female with medical history significant of chronic diarrhea, nonalcoholic liver cirrhosis, heart murmur, hypertension, cholelithiasis, class III obesity, pulmonary embolism, short gut syndrome, hypothyroidism who presented to the emergency department due to altered mental status.  Per patient's son, she started acting incoherently this morning but is now closer to baseline.  She usually does these mental changes when she has an UTI or hyperammonemia.  She is currently complaining of back pain but denied any other complaints.   100.1 F, pulse 7016, respirations 32, BP 98/59 mmHg O2 sat 95% on room air.  The patient received 2000 mL of LR bolus, 500 mL of normal saline bolus followed by LR at 150 mL/h and ceftriaxone 2 g IVPB.  Subsequently, the maintenance fluids were held due to concerns of pulmonary edema and resolution of hypotension.  Significant Events: Admitted 09/30/2022 for acute metabolic encephalopathy, sepsis from UTI   Significant Labs: Admitting WBC 15.2 Admitting blood cx 09-30-2022 growing ESBL pseudomonas  Significant Imaging Studies:   Antibiotic Therapy: Anti-infectives (From admission, onward)    Start     Dose/Rate Route Frequency Ordered Stop   10/03/22 1100  meropenem (MERREM) 1 g in sodium chloride 0.9 % 100 mL IVPB        1 g 200 mL/hr over 30 Minutes Intravenous Every 8 hours 10/03/22 1014     10/01/22 1130  ceFEPIme (MAXIPIME) 2 g in sodium chloride 0.9 % 100 mL IVPB  Status:  Discontinued        2 g 200 mL/hr over 30 Minutes Intravenous Every 8 hours 10/01/22 1043 10/03/22 1014   09/30/22 2200  rifaximin (XIFAXAN) tablet 550 mg         550 mg Oral 2 times daily 09/30/22 1805     09/30/22 0830  cefTRIAXone (ROCEPHIN) 2 g in sodium chloride 0.9 % 100 mL IVPB  Status:  Discontinued        2 g 200 mL/hr over 30 Minutes Intravenous Every 24 hours 09/30/22 0823 10/01/22 1043       Procedures:   Consultants: Infectious Disease    Assessment and Plan: * Severe sepsis [facility]) Admitted for severe sepsis due to UTI. Now resolved.  Septicemia due to Pseudomonas (HCC) - ESBL On IV meropenem. Will need 10 days of IV Abx. End date 10-11-2021. Pt and family unable to manage home IV Abx. Will need to stay inpatient to complete IV ABX course.  Acute metabolic encephalopathy Due to severe sepsis and UTI.  Morbid obesity (HCC) BMI 39.4  Short gut syndrome Has chronic diarrhea.  Hyperammonemia (HCC) Stable. Resolved  Hypotension Due to severe sepsis and UTI. Resolved with IVF.  Hypothyroidism Stable. On synthroid 150 mcg.  Thrombocytopenia (HCC) Resolved. Due to sepsis.  GERD (gastroesophageal reflux disease) Stable. On PPI.  Iron deficiency anemia Stable.  Cirrhosis due to NASH Stable.  Vaginal bleeding Pt with hx of uterine fibroids. Last dose of lovenox on 10-03-2022. [name] says that pt still having some vaginal bleeding. Somewhat improved. Definitely not worse. Will check CBC in AM.  DNR (do not resuscitate) Pt is DNR/DNI.   DVT prophylaxis: Place and maintain sequential compression device Start: 10/04/22 1234  SCDs Start: 09/30/22 1241    Code Status: Do not attempt resuscitation (DNR) PRE-ARREST INTERVENTIONS DESIRED Family Communication: discussed with pt and son [name] at bedside Disposition Plan: return home Reason for continuing need for hospitalization: remains on IV ABX.  Objective: Vitals:   10/05/22 0753 10/05/22 2040 10/06/22 0500 10/06/22 0509  BP: 108/67 (!) 96/52  119/62  Pulse: 95 75  86  Resp:  18  20  Temp: 98 F (36.7 C) 98.5 F (36.9 C)  98.5 F (36.9 C)  TempSrc:       SpO2: 98% 100%  98%  Weight:   97.5 kg   Height:        Intake/Output Summary (Last 24 hours) at 10/06/2022 1357 Last data filed at 10/06/2022 0624 Gross per 24 hour  Intake 773.46 ml  Output --  Net 773.46 ml   Filed Weights   10/03/22 0500 10/05/22 0547 10/06/22 0500  Weight: 99.1 kg 97.7 kg 97.5 kg    Examination:  Physical Exam Vitals and nursing note reviewed.  Constitutional:      General: She is not in acute distress.    Appearance: She is obese. She is not toxic-appearing or diaphoretic.  HENT:     Head: Normocephalic and atraumatic.     Nose: Nose normal.  Eyes:     General: No scleral icterus.    Comments: Cloudy left corneal  Cardiovascular:     Rate and Rhythm: Normal rate and regular rhythm.  Pulmonary:     Effort: Pulmonary effort is normal.     Breath sounds: Normal breath sounds.  Abdominal:     General: Bowel sounds are normal. There is no distension.  Skin:    General: Skin is warm and dry.     Capillary Refill: Capillary refill takes less than 2 seconds.  Neurological:     Mental Status: She is alert and oriented to person, place, and time.     Gait: Gait normal.     Data Reviewed: I have personally reviewed following labs and imaging studies  CBC: Recent Labs  Lab 09/30/22 0800 09/30/22 0817 10/01/22 0419 10/02/22 0419 10/03/22 0403 10/05/22 0609  WBC 15.2*  --  12.6* 6.9 6.2 6.2  NEUTROABS 13.4*  --   --   --   --   --   HGB 9.6* 8.8* 8.0* 7.4* 8.0* 8.3*  HCT 30.5* 26.0* 25.3* 23.9* 26.8* 28.2*  MCV 96.5  --  97.3 99.2 100.4* 102.9*  PLT 136*  --  117* 106* 123* 139*   Basic Metabolic Panel: Recent Labs  Lab 10/01/22 0419 10/02/22 0419 10/03/22 0403 10/05/22 0609 10/06/22 0845  NA 133* 134* 135 135 137  K 3.5 3.2* 3.5 3.7 3.3*  CL 105 105 109 110 111  CO2 21* 22 20* 18* 20*  GLUCOSE 95 124* 123* 100* 100*  BUN 26* 29* 28* 23 22  CREATININE 1.26* 1.17* 1.06* 0.88 0.86  CALCIUM 7.9* 7.5* 7.9* 7.8* 7.8*  MG  --  1.6*  2.1 1.8 1.6*  PHOS  --  3.8 3.7 2.9  --    GFR: Estimated Creatinine Clearance: 71.1 mL/min (by C-G formula based on SCr of 0.86 mg/dL). Liver Function Tests: Recent Labs  Lab 09/30/22 0800 10/01/22 0419 10/02/22 0419 10/03/22 0403 10/05/22 0609 10/06/22 0845  AST 41 42* 37  --   --  53*  ALT 24 22 22   --   --  33  ALKPHOS 165* 117 108  --   --  106  BILITOT 2.3* 1.4* 0.9  --   --  1.3*  PROT 6.4* 4.9* 4.8*  --   --  5.2*  ALBUMIN 2.6* 2.0* 1.9* 2.2* 2.1* 2.0*    Recent Labs  Lab 09/30/22 0800 10/02/22 0419  AMMONIA 63* 48*   Coagulation Profile: Recent Labs  Lab 09/30/22 0800  INR 1.6*   Sepsis Labs: Recent Labs  Lab 09/30/22 0817 09/30/22 1306 09/30/22 1631 10/01/22 0419  LATICACIDVEN 3.8* 3.4* 3.3* 1.3    Recent Results (from the past 240 hour(s))  SARS Coronavirus 2 by RT PCR (hospital order, performed in [facility] hospital lab) *cepheid single result test* Anterior Nasal Swab     Status: None   Collection Time: 09/30/22  7:45 AM   Specimen: Anterior Nasal Swab  Result Value Ref Range Status   SARS Coronavirus 2 by RT PCR NEGATIVE NEGATIVE Final    Comment: (NOTE) SARS-CoV-2 target nucleic acids are NOT DETECTED.  The SARS-CoV-2 RNA is generally detectable in upper and lower respiratory specimens during the acute phase of infection. The lowest concentration of SARS-CoV-2 viral copies this assay can detect is 250 copies / mL. A negative result does not preclude SARS-CoV-2 infection and should not be used as the sole basis for treatment or other patient management decisions.  A negative result may occur with improper specimen collection / handling, submission of specimen other than nasopharyngeal swab, presence of viral mutation(s) within the areas targeted by this assay, and inadequate number of viral copies (<250 copies / mL). A negative result must be combined with clinical observations, patient history, and epidemiological information.  Fact  Sheet for Patients:   RoadLapTop.co.za  Fact Sheet for Healthcare Providers: http://kim-miller.com/  This test is not yet approved or  cleared by the [place] FDA and has been authorized for detection and/or diagnosis of SARS-CoV-2 by FDA under an Emergency Use Authorization (EUA).  This EUA will remain in effect (meaning this test can be used) for the duration of the COVID-19 declaration under Section 564(b)(1) of the Act, 21 U.S.C. section 360bbb-3(b)(1), unless the authorization is terminated or revoked sooner.  Performed at [facility], [address] [place], [place] [address]   Blood culture (routine x 2)     Status: Abnormal   Collection Time: 09/30/22  9:30 AM   Specimen: BLOOD RIGHT HAND  Result Value Ref Range Status   Specimen Description BLOOD RIGHT HAND  Final   Special Requests   Final    BOTTLES DRAWN AEROBIC AND ANAEROBIC Blood Culture adequate volume   Culture  Setup Time   Final    GRAM NEGATIVE RODS AEROBIC BOTTLE ONLY CRITICAL RESULT CALLED TO, READ BACK BY AND VERIFIED WITH: PHARMD Cherylin Mylar on [id number] @1021H  BY SM    Culture PSEUDOMONAS AERUGINOSA (A)  Final   Report Status 10/03/2022 FINAL  Final   Organism ID, Bacteria PSEUDOMONAS AERUGINOSA  Final      Susceptibility   Pseudomonas aeruginosa - MIC*    CEFTAZIDIME 16 INTERMEDIATE Intermediate     CIPROFLOXACIN 1 INTERMEDIATE Intermediate     GENTAMICIN 8 INTERMEDIATE Intermediate     IMIPENEM 2 SENSITIVE Sensitive     CEFEPIME Value in next row Resistant      RESISTANTPerformed at [facility], [address], [place], [place] [address]    * PSEUDOMONAS AERUGINOSA  Blood Culture ID Panel (Reflexed)     Status: Abnormal   Collection Time: 09/30/22  9:30 AM  Result  Value Ref Range Status   Enterococcus faecalis NOT DETECTED NOT DETECTED Final   Enterococcus Faecium NOT DETECTED NOT DETECTED Final   Listeria monocytogenes  NOT DETECTED NOT DETECTED Final   Staphylococcus species NOT DETECTED NOT DETECTED Final   Staphylococcus aureus (BCID) NOT DETECTED NOT DETECTED Final   Staphylococcus epidermidis NOT DETECTED NOT DETECTED Final   Staphylococcus lugdunensis NOT DETECTED NOT DETECTED Final   Streptococcus species NOT DETECTED NOT DETECTED Final   Streptococcus agalactiae NOT DETECTED NOT DETECTED Final   Streptococcus pneumoniae NOT DETECTED NOT DETECTED Final   Streptococcus pyogenes NOT DETECTED NOT DETECTED Final   A.calcoaceticus-baumannii NOT DETECTED NOT DETECTED Final   Bacteroides fragilis NOT DETECTED NOT DETECTED Final   Enterobacterales NOT DETECTED NOT DETECTED Final   Enterobacter cloacae complex NOT DETECTED NOT DETECTED Final   Escherichia coli NOT DETECTED NOT DETECTED Final   Klebsiella aerogenes NOT DETECTED NOT DETECTED Final   Klebsiella oxytoca NOT DETECTED NOT DETECTED Final   Klebsiella pneumoniae NOT DETECTED NOT DETECTED Final   Proteus species NOT DETECTED NOT DETECTED Final   Salmonella species NOT DETECTED NOT DETECTED Final   Serratia marcescens NOT DETECTED NOT DETECTED Final   Haemophilus influenzae NOT DETECTED NOT DETECTED Final   Neisseria meningitidis NOT DETECTED NOT DETECTED Final   Pseudomonas aeruginosa (A) NOT DETECTED Final    CRITICAL RESULT CALLED TO, READ BACK BY AND VERIFIED WITH:    Comment: PHARMD S.DAVIS AT 1021 ON 10/01/2022 BY SM. DETECTED    Stenotrophomonas maltophilia NOT DETECTED NOT DETECTED Final   Candida albicans NOT DETECTED NOT DETECTED Final   Candida auris NOT DETECTED NOT DETECTED Final   Candida glabrata NOT DETECTED NOT DETECTED Final   Candida krusei NOT DETECTED NOT DETECTED Final   Candida parapsilosis NOT DETECTED NOT DETECTED Final   Candida tropicalis NOT DETECTED NOT DETECTED Final   Cryptococcus neoformans/gattii NOT DETECTED NOT DETECTED Final   CTX-M ESBL NOT DETECTED NOT DETECTED Final   Carbapenem resistance IMP NOT  DETECTED NOT DETECTED Final   Carbapenem resistance KPC NOT DETECTED NOT DETECTED Final   Carbapenem resistance NDM NOT DETECTED NOT DETECTED Final   Carbapenem resist OXA 48 LIKE NOT DETECTED NOT DETECTED Final   Carbapenem resistance VIM NOT DETECTED NOT DETECTED Final    Comment: Performed at [facility], [address] [place], [place] [address]  Blood culture (routine x 2)     Status: Abnormal   Collection Time: 09/30/22  9:49 AM   Specimen: BLOOD LEFT ARM  Result Value Ref Range Status   Specimen Description   Final    BLOOD LEFT ARM Performed at [facility], [address] [place], [place] [address]    Special Requests   Final    BOTTLES DRAWN AEROBIC AND ANAEROBIC Blood Culture adequate volume Performed at [facility], [address], [place], [place] [address]    Culture  Setup Time   Final    GRAM NEGATIVE RODS AEROBIC BOTTLE ONLY CRITICAL VALUE NOTED.  VALUE IS CONSISTENT WITH PREVIOUSLY REPORTED AND CALLED VALUE.    Culture (A)  Final    PSEUDOMONAS AERUGINOSA SUSCEPTIBILITIES PERFORMED ON PREVIOUS CULTURE WITHIN THE LAST 5 DAYS. Performed at [facility], [address] [place], [place] [address]    Report Status 10/03/2022 FINAL  Final  Urine Culture     Status: None   Collection Time: 09/30/22 11:26 AM   Specimen: Urine, Random  Result Value Ref Range  Status   Specimen Description   Final    URINE, RANDOM Performed at [facility], [address], [place], [place] [address]    Special Requests   Final    NONE Reflexed from [id number] Performed at [facility], [address] [place], [place] [address]    Culture   Final    NO GROWTH Performed at [facility], [address], [place], [place] [address]    Report Status 10/01/2022 FINAL  Final  Culture, blood (Routine X 2) w Reflex to ID Panel     Status: None (Preliminary result)   Collection Time: 10/04/22 10:19 AM    Specimen: BLOOD  Result Value Ref Range Status   Specimen Description   Final    BLOOD SITE NOT SPECIFIED Performed at [facility], [address], [place], [place] [address]    Special Requests   Final    BOTTLES DRAWN AEROBIC AND ANAEROBIC Blood Culture adequate volume Performed at [facility], [address], [place], [place] [address]    Culture   Final    NO GROWTH 2 DAYS Performed at [facility], [address], [place], [place] [address]    Report Status PENDING  Incomplete  Culture, blood (Routine X 2) w Reflex to ID Panel     Status: None (Preliminary result)   Collection Time: 10/04/22 10:19 AM   Specimen: BLOOD RIGHT HAND  Result Value Ref Range Status   Specimen Description   Final    BLOOD RIGHT HAND Performed at [facility], [address], [place], [place] [address]    Special Requests   Final    BOTTLES DRAWN AEROBIC ONLY Blood Culture adequate volume Performed at [facility], [address] [place], [place] [address]    Culture   Final    NO GROWTH 2 DAYS Performed at [facility], [address], [place], [place] [address]    Report Status PENDING  Incomplete     Radiology Studies: No results found.  Scheduled Meds:  atorvastatin  40 mg Oral q AM   cyanocobalamin  2,000 mcg Oral Q Fri   folic acid  1 mg Oral Daily   furosemide  40 mg Oral BID    HYDROmorphone (DILAUDID) injection  1 mg Intravenous Once   lactulose  30 g Oral BID   levothyroxine  150 mcg Oral QAC breakfast   magnesium gluconate  500 mg Oral Daily   midodrine  10 mg Oral TID WC   nystatin cream   Topical BID   pantoprazole  40 mg Oral BID AC   rifaximin  550 mg Oral BID   rOPINIRole  0.25 mg Oral QHS   spironolactone  25 mg Oral Daily   tamsulosin  0.4 mg Oral Daily   venlafaxine XR  75 mg Oral Q breakfast   Continuous Infusions:  meropenem (MERREM) IV 1 g (10/06/22 1341)     LOS: 6 days   Time  spent: 35 minutes  Carollee Herter, DO  [facility]  10/06/2022, 1:57 PM

## 2022-10-06 NOTE — Plan of Care (Signed)

## 2022-10-06 NOTE — Plan of Care (Signed)
  Problem: Fluid Volume: Goal: Hemodynamic stability will improve 10/06/2022 0458 by Precious Bard, RN Outcome: Progressing 10/06/2022 0457 by Precious Bard, RN Outcome: Progressing   Problem: Clinical Measurements: Goal: Diagnostic test results will improve 10/06/2022 0458 by Precious Bard, RN Outcome: Progressing 10/06/2022 0457 by Precious Bard, RN Outcome: Progressing Goal: Signs and symptoms of infection will decrease 10/06/2022 0458 by Precious Bard, RN Outcome: Progressing 10/06/2022 0457 by Precious Bard, RN Outcome: Progressing   Problem: Respiratory: Goal: Ability to maintain adequate ventilation will improve 10/06/2022 0458 by Precious Bard, RN Outcome: Progressing 10/06/2022 0457 by Precious Bard, RN Outcome: Progressing   Problem: Education: Goal: Knowledge of General Education information will improve Description: Including pain rating scale, medication(s)/side effects and non-pharmacologic comfort measures Outcome: Progressing   Problem: Health Behavior/Discharge Planning: Goal: Ability to manage health-related needs will improve Outcome: Progressing   Problem: Clinical Measurements: Goal: Ability to maintain clinical measurements within normal limits will improve Outcome: Progressing Goal: Will remain free from infection Outcome: Progressing Goal: Diagnostic test results will improve Outcome: Progressing Goal: Respiratory complications will improve Outcome: Progressing Goal: Cardiovascular complication will be avoided Outcome: Progressing   Problem: Activity: Goal: Risk for activity intolerance will decrease Outcome: Progressing   Problem: Nutrition: Goal: Adequate nutrition will be maintained Outcome: Progressing   Problem: Coping: Goal: Level of anxiety will decrease Outcome: Progressing   Problem: Elimination: Goal: Will not experience complications  related to bowel motility Outcome: Progressing Goal: Will not experience complications related to urinary retention Outcome: Progressing   Problem: Pain Managment: Goal: General experience of comfort will improve Outcome: Progressing   Problem: Safety: Goal: Ability to remain free from injury will improve Outcome: Progressing   Problem: Skin Integrity: Goal: Risk for impaired skin integrity will decrease Outcome: Progressing

## 2022-10-07 ENCOUNTER — Encounter (HOSPITAL_COMMUNITY): Payer: Self-pay

## 2022-10-07 DIAGNOSIS — N3000 Acute cystitis without hematuria: Secondary | ICD-10-CM

## 2022-10-07 DIAGNOSIS — A419 Sepsis, unspecified organism: Secondary | ICD-10-CM | POA: Diagnosis not present

## 2022-10-07 DIAGNOSIS — R6520 Severe sepsis without septic shock: Secondary | ICD-10-CM | POA: Diagnosis not present

## 2022-10-07 DIAGNOSIS — A4152 Sepsis due to Pseudomonas: Secondary | ICD-10-CM | POA: Diagnosis not present

## 2022-10-07 DIAGNOSIS — D696 Thrombocytopenia, unspecified: Secondary | ICD-10-CM | POA: Diagnosis not present

## 2022-10-07 LAB — CBC WITH DIFFERENTIAL/PLATELET
Abs Immature Granulocytes: 0.02 10*3/uL (ref 0.00–0.07)
Basophils Absolute: 0.1 10*3/uL (ref 0.0–0.1)
Basophils Relative: 1 %
Eosinophils Absolute: 0.5 10*3/uL (ref 0.0–0.5)
Eosinophils Relative: 9 %
HCT: 24.3 % — ABNORMAL LOW (ref 36.0–46.0)
Hemoglobin: 7.4 g/dL — ABNORMAL LOW (ref 12.0–15.0)
Immature Granulocytes: 0 %
Lymphocytes Relative: 29 %
Lymphs Abs: 1.6 10*3/uL (ref 0.7–4.0)
MCH: 29.4 pg (ref 26.0–34.0)
MCHC: 30.5 g/dL (ref 30.0–36.0)
MCV: 96.4 fL (ref 80.0–100.0)
Monocytes Absolute: 0.6 10*3/uL (ref 0.1–1.0)
Monocytes Relative: 11 %
Neutro Abs: 2.7 10*3/uL (ref 1.7–7.7)
Neutrophils Relative %: 50 %
Platelets: 126 10*3/uL — ABNORMAL LOW (ref 150–400)
RBC: 2.52 MIL/uL — ABNORMAL LOW (ref 3.87–5.11)
RDW: 16.2 % — ABNORMAL HIGH (ref 11.5–15.5)
WBC: 5.4 10*3/uL (ref 4.0–10.5)
nRBC: 0 % (ref 0.0–0.2)

## 2022-10-07 MED ORDER — MEGESTROL ACETATE 40 MG PO TABS
40.0000 mg | ORAL_TABLET | Freq: Two times a day (BID) | ORAL | Status: DC
  Administered 2022-10-07 – 2022-10-12 (×11): 40 mg via ORAL
  Filled 2022-10-07 (×11): qty 1

## 2022-10-07 NOTE — Plan of Care (Signed)

## 2022-10-07 NOTE — Progress Notes (Signed)
PROGRESS NOTE    [name]  ZOX:[id number] DOB: [birth year] DOA: 09/30/2022 PCP: [name], MD  Subjective: Pt seen and examined. Pt is stable. Pt's [name] at bedside.  Pt will need 10 days of IV meropenem for pseudomonas septicemia from blood cx. Urine cx negative.  Pt still with vaginal bleeding. Was suppose to see GYN on 10-05-2022 [place] in clinic. Missed appointment due to hospitalization. Son requesting GYN consult in-house.  Overall, doing well.   Hospital Course: HPI: [name] is a 67 y.o. female with medical history significant of chronic diarrhea, nonalcoholic liver cirrhosis, heart murmur, hypertension, cholelithiasis, class III obesity, pulmonary embolism, short gut syndrome, hypothyroidism who presented to the emergency department due to altered mental status.  Per patient's son, she started acting incoherently this morning but is now closer to baseline.  She usually does these mental changes when she has an UTI or hyperammonemia.  She is currently complaining of back pain but denied any other complaints.   100.1 F, pulse 7016, respirations 32, BP 98/59 mmHg O2 sat 95% on room air.  The patient received 2000 mL of LR bolus, 500 mL of normal saline bolus followed by LR at 150 mL/h and ceftriaxone 2 g IVPB.  Subsequently, the maintenance fluids were held due to concerns of pulmonary edema and resolution of hypotension.  Significant Events: Admitted 09/30/2022 for acute metabolic encephalopathy, sepsis from UTI   Significant Labs: Admitting WBC 15.2 Admitting blood cx 09-30-2022 growing ESBL pseudomonas  Significant Imaging Studies:   Antibiotic Therapy: Anti-infectives (From admission, onward)    Start     Dose/Rate Route Frequency Ordered Stop   10/03/22 1100  meropenem (MERREM) 1 g in sodium chloride 0.9 % 100 mL IVPB        1 g 200 mL/hr over 30 Minutes Intravenous Every 8 hours 10/03/22 1014     10/01/22 1130  ceFEPIme (MAXIPIME) 2 g in sodium  chloride 0.9 % 100 mL IVPB  Status:  Discontinued        2 g 200 mL/hr over 30 Minutes Intravenous Every 8 hours 10/01/22 1043 10/03/22 1014   09/30/22 2200  rifaximin (XIFAXAN) tablet 550 mg        550 mg Oral 2 times daily 09/30/22 1805     09/30/22 0830  cefTRIAXone (ROCEPHIN) 2 g in sodium chloride 0.9 % 100 mL IVPB  Status:  Discontinued        2 g 200 mL/hr over 30 Minutes Intravenous Every 24 hours 09/30/22 0823 10/01/22 1043       Procedures:   Consultants: Infectious Disease    Assessment and Plan: * Severe sepsis [name]) Admitted for severe sepsis due to UTI. Now resolved.  Septicemia due to Pseudomonas (HCC) - ESBL On IV meropenem. Will need 10 days of IV Abx. End date 10-11-2021. Pt and family unable to manage home IV Abx. Will need to stay inpatient to complete IV ABX course.  Acute metabolic encephalopathy Due to severe sepsis and UTI.  Morbid obesity (HCC) BMI 39.4  Short gut syndrome Has chronic diarrhea.  Hyperammonemia (HCC) Stable. Resolved  Hypotension Due to severe sepsis and UTI. Resolved with IVF.  Hypothyroidism Stable. On synthroid 150 mcg.  Thrombocytopenia (HCC) Resolved. Due to sepsis.  GERD (gastroesophageal reflux disease) Stable. On PPI.  Iron deficiency anemia Stable.  Cirrhosis due to NASH Stable.  Vaginal bleeding Pt with hx of uterine fibroids. Last dose of lovenox on 10-03-2022. [name] says that pt still having some vaginal bleeding. Son  requesting GYN consult since pt missed her outpatient appointment. HgB down to 7.4 today. Was 8.3 g/dl on [id number].  DNR (do not resuscitate) Pt is DNR/DNI.   DVT prophylaxis: Place and maintain sequential compression device Start: 10/04/22 1234 SCDs Start: 09/30/22 1241     Code Status: Do not attempt resuscitation (DNR) PRE-ARREST INTERVENTIONS DESIRED Family Communication: discussed with pt and son [name] at bedside. Son is requesting in-house GYN consult for persistent vaginal  bleeding Disposition Plan: return home Reason for continuing need for hospitalization: remains on IV ABX.  Objective: Vitals:   10/06/22 0509 10/06/22 1437 10/06/22 1921 10/07/22 0525  BP: 119/62 113/65 102/60 (!) 106/57  Pulse: 86 84 79 75  Resp: 20  18 18   Temp: 98.5 F (36.9 C) 98.6 F (37 C) 98.2 F (36.8 C) 98.4 F (36.9 C)  TempSrc:      SpO2: 98% 100% 100% 97%  Weight:      Height:        Intake/Output Summary (Last 24 hours) at 10/07/2022 1029 Last data filed at 10/06/2022 1820 Gross per 24 hour  Intake 218 ml  Output --  Net 218 ml   Filed Weights   10/03/22 0500 10/05/22 0547 10/06/22 0500  Weight: 99.1 kg 97.7 kg 97.5 kg    Examination:  Physical Exam Vitals and nursing note reviewed.  Constitutional:      General: She is not in acute distress.    Appearance: She is obese. She is not toxic-appearing or diaphoretic.     Comments: Sitting up in bed. Eating breakfast.  HENT:     Head: Normocephalic and atraumatic.     Nose: Nose normal.  Eyes:     General: No scleral icterus.    Comments: Cloudy left corneal  Cardiovascular:     Rate and Rhythm: Normal rate and regular rhythm.  Pulmonary:     Effort: Pulmonary effort is normal.     Breath sounds: Normal breath sounds.  Abdominal:     General: Bowel sounds are normal. There is no distension.  Skin:    General: Skin is warm and dry.     Capillary Refill: Capillary refill takes less than 2 seconds.  Neurological:     Mental Status: She is alert and oriented to person, place, and time.     Data Reviewed: I have personally reviewed following labs and imaging studies  CBC: Recent Labs  Lab 10/01/22 0419 10/02/22 0419 10/03/22 0403 10/05/22 0609 10/07/22 0518  WBC 12.6* 6.9 6.2 6.2 5.4  NEUTROABS  --   --   --   --  2.7  HGB 8.0* 7.4* 8.0* 8.3* 7.4*  HCT 25.3* 23.9* 26.8* 28.2* 24.3*  MCV 97.3 99.2 100.4* 102.9* 96.4  PLT 117* 106* 123* 139* 126*   Basic Metabolic Panel: Recent Labs  Lab  10/01/22 0419 10/02/22 0419 10/03/22 0403 10/05/22 0609 10/06/22 0845  NA 133* 134* 135 135 137  K 3.5 3.2* 3.5 3.7 3.3*  CL 105 105 109 110 111  CO2 21* 22 20* 18* 20*  GLUCOSE 95 124* 123* 100* 100*  BUN 26* 29* 28* 23 22  CREATININE 1.26* 1.17* 1.06* 0.88 0.86  CALCIUM 7.9* 7.5* 7.9* 7.8* 7.8*  MG  --  1.6* 2.1 1.8 1.6*  PHOS  --  3.8 3.7 2.9  --    GFR: Estimated Creatinine Clearance: 71.1 mL/min (by C-G formula based on SCr of 0.86 mg/dL). Liver Function Tests: Recent Labs  Lab 10/01/22 0419 10/02/22 0419 10/03/22  0403 10/05/22 0609 10/06/22 0845  AST 42* 37  --   --  53*  ALT 22 22  --   --  33  ALKPHOS 117 108  --   --  106  BILITOT 1.4* 0.9  --   --  1.3*  PROT 4.9* 4.8*  --   --  5.2*  ALBUMIN 2.0* 1.9* 2.2* 2.1* 2.0*    Recent Labs  Lab 10/02/22 0419  AMMONIA 48*   Sepsis Labs: Recent Labs  Lab 09/30/22 1306 09/30/22 1631 10/01/22 0419  LATICACIDVEN 3.4* 3.3* 1.3    Recent Results (from the past 240 hour(s))  SARS Coronavirus 2 by RT PCR (hospital order, performed in [facility] hospital lab) *cepheid single result test* Anterior Nasal Swab     Status: None   Collection Time: 09/30/22  7:45 AM   Specimen: Anterior Nasal Swab  Result Value Ref Range Status   SARS Coronavirus 2 by RT PCR NEGATIVE NEGATIVE Final    Comment: (NOTE) SARS-CoV-2 target nucleic acids are NOT DETECTED.  The SARS-CoV-2 RNA is generally detectable in upper and lower respiratory specimens during the acute phase of infection. The lowest concentration of SARS-CoV-2 viral copies this assay can detect is 250 copies / mL. A negative result does not preclude SARS-CoV-2 infection and should not be used as the sole basis for treatment or other patient management decisions.  A negative result may occur with improper specimen collection / handling, submission of specimen other than nasopharyngeal swab, presence of viral mutation(s) within the areas targeted by this assay, and  inadequate number of viral copies (<250 copies / mL). A negative result must be combined with clinical observations, patient history, and epidemiological information.  Fact Sheet for Patients:   RoadLapTop.co.za  Fact Sheet for Healthcare Providers: http://kim-miller.com/  This test is not yet approved or  cleared by the [place] FDA and has been authorized for detection and/or diagnosis of SARS-CoV-2 by FDA under an Emergency Use Authorization (EUA).  This EUA will remain in effect (meaning this test can be used) for the duration of the COVID-19 declaration under Section 564(b)(1) of the Act, 21 U.S.C. section 360bbb-3(b)(1), unless the authorization is terminated or revoked sooner.  Performed at [facility], [address], [place], [place] [address]   Blood culture (routine x 2)     Status: Abnormal   Collection Time: 09/30/22  9:30 AM   Specimen: BLOOD RIGHT HAND  Result Value Ref Range Status   Specimen Description BLOOD RIGHT HAND  Final   Special Requests   Final    BOTTLES DRAWN AEROBIC AND ANAEROBIC Blood Culture adequate volume   Culture  Setup Time   Final    GRAM NEGATIVE RODS AEROBIC BOTTLE ONLY CRITICAL RESULT CALLED TO, READ BACK BY AND VERIFIED WITH: PHARMD Cherylin Mylar on [id number] @1021H  BY SM    Culture PSEUDOMONAS AERUGINOSA (A)  Final   Report Status 10/03/2022 FINAL  Final   Organism ID, Bacteria PSEUDOMONAS AERUGINOSA  Final      Susceptibility   Pseudomonas aeruginosa - MIC*    CEFTAZIDIME 16 INTERMEDIATE Intermediate     CIPROFLOXACIN 1 INTERMEDIATE Intermediate     GENTAMICIN 8 INTERMEDIATE Intermediate     IMIPENEM 2 SENSITIVE Sensitive     CEFEPIME Value in next row Resistant      RESISTANTPerformed at [facility], [address], [place], [place] [address]    * PSEUDOMONAS AERUGINOSA  Blood Culture ID Panel (Reflexed)  Status: Abnormal   Collection Time: 09/30/22   9:30 AM  Result Value Ref Range Status   Enterococcus faecalis NOT DETECTED NOT DETECTED Final   Enterococcus Faecium NOT DETECTED NOT DETECTED Final   Listeria monocytogenes NOT DETECTED NOT DETECTED Final   Staphylococcus species NOT DETECTED NOT DETECTED Final   Staphylococcus aureus (BCID) NOT DETECTED NOT DETECTED Final   Staphylococcus epidermidis NOT DETECTED NOT DETECTED Final   Staphylococcus lugdunensis NOT DETECTED NOT DETECTED Final   Streptococcus species NOT DETECTED NOT DETECTED Final   Streptococcus agalactiae NOT DETECTED NOT DETECTED Final   Streptococcus pneumoniae NOT DETECTED NOT DETECTED Final   Streptococcus pyogenes NOT DETECTED NOT DETECTED Final   A.calcoaceticus-baumannii NOT DETECTED NOT DETECTED Final   Bacteroides fragilis NOT DETECTED NOT DETECTED Final   Enterobacterales NOT DETECTED NOT DETECTED Final   Enterobacter cloacae complex NOT DETECTED NOT DETECTED Final   Escherichia coli NOT DETECTED NOT DETECTED Final   Klebsiella aerogenes NOT DETECTED NOT DETECTED Final   Klebsiella oxytoca NOT DETECTED NOT DETECTED Final   Klebsiella pneumoniae NOT DETECTED NOT DETECTED Final   Proteus species NOT DETECTED NOT DETECTED Final   Salmonella species NOT DETECTED NOT DETECTED Final   Serratia marcescens NOT DETECTED NOT DETECTED Final   Haemophilus influenzae NOT DETECTED NOT DETECTED Final   Neisseria meningitidis NOT DETECTED NOT DETECTED Final   Pseudomonas aeruginosa (A) NOT DETECTED Final    CRITICAL RESULT CALLED TO, READ BACK BY AND VERIFIED WITH:    Comment: PHARMD S.DAVIS AT 1021 ON 10/01/2022 BY SM. DETECTED    Stenotrophomonas maltophilia NOT DETECTED NOT DETECTED Final   Candida albicans NOT DETECTED NOT DETECTED Final   Candida auris NOT DETECTED NOT DETECTED Final   Candida glabrata NOT DETECTED NOT DETECTED Final   Candida krusei NOT DETECTED NOT DETECTED Final   Candida parapsilosis NOT DETECTED NOT DETECTED Final   Candida tropicalis NOT  DETECTED NOT DETECTED Final   Cryptococcus neoformans/gattii NOT DETECTED NOT DETECTED Final   CTX-M ESBL NOT DETECTED NOT DETECTED Final   Carbapenem resistance IMP NOT DETECTED NOT DETECTED Final   Carbapenem resistance KPC NOT DETECTED NOT DETECTED Final   Carbapenem resistance NDM NOT DETECTED NOT DETECTED Final   Carbapenem resist OXA 48 LIKE NOT DETECTED NOT DETECTED Final   Carbapenem resistance VIM NOT DETECTED NOT DETECTED Final    Comment: Performed at [facility], [address] [place], [place] [address]  Blood culture (routine x 2)     Status: Abnormal   Collection Time: 09/30/22  9:49 AM   Specimen: BLOOD LEFT ARM  Result Value Ref Range Status   Specimen Description   Final    BLOOD LEFT ARM Performed at [facility], [address], [place], [place] [address]    Special Requests   Final    BOTTLES DRAWN AEROBIC AND ANAEROBIC Blood Culture adequate volume Performed at [facility], [address] [place], [place] [address]    Culture  Setup Time   Final    GRAM NEGATIVE RODS AEROBIC BOTTLE ONLY CRITICAL VALUE NOTED.  VALUE IS CONSISTENT WITH PREVIOUSLY REPORTED AND CALLED VALUE.    Culture (A)  Final    PSEUDOMONAS AERUGINOSA SUSCEPTIBILITIES PERFORMED ON PREVIOUS CULTURE WITHIN THE LAST 5 DAYS. Performed at [facility], [address], [place], [place] [address]    Report Status 10/03/2022 FINAL  Final  Urine Culture     Status: None   Collection Time: 09/30/22  11:26 AM   Specimen: Urine, Random  Result Value Ref Range Status   Specimen Description   Final    URINE, RANDOM Performed at [facility], [address], [place], [place] [address]    Special Requests   Final    NONE Reflexed from [id number] Performed at [facility], [address] [place], [place] [address]    Culture   Final    NO GROWTH Performed at [facility], [address], [place], [place] [address]     Report Status 10/01/2022 FINAL  Final  Culture, blood (Routine X 2) w Reflex to ID Panel     Status: None (Preliminary result)   Collection Time: 10/04/22 10:19 AM   Specimen: BLOOD  Result Value Ref Range Status   Specimen Description   Final    BLOOD SITE NOT SPECIFIED Performed at [facility], [address], [place], [place] [address]    Special Requests   Final    BOTTLES DRAWN AEROBIC AND ANAEROBIC Blood Culture adequate volume Performed at [facility], [address], [place], [place] [address]    Culture   Final    NO GROWTH 3 DAYS Performed at [facility], [address], [place], [place] [address]    Report Status PENDING  Incomplete  Culture, blood (Routine X 2) w Reflex to ID Panel     Status: None (Preliminary result)   Collection Time: 10/04/22 10:19 AM   Specimen: BLOOD RIGHT HAND  Result Value Ref Range Status   Specimen Description   Final    BLOOD RIGHT HAND Performed at [facility], [address], [place], [place] [address]    Special Requests   Final    BOTTLES DRAWN AEROBIC ONLY Blood Culture adequate volume Performed at [facility], [address], [place], [place] [address]    Culture   Final    NO GROWTH 3 DAYS Performed at [facility], [address], [place], [place] [address]    Report Status PENDING  Incomplete     Radiology Studies: No results found.  Scheduled Meds:  atorvastatin  40 mg Oral q AM   cyanocobalamin  2,000 mcg Oral Q Fri   folic acid  1 mg Oral Daily   furosemide  40 mg Oral BID    HYDROmorphone (DILAUDID) injection  1 mg Intravenous Once   lactulose  30 g Oral BID   levothyroxine  150 mcg Oral QAC breakfast   magnesium gluconate  500 mg Oral Daily   midodrine  10 mg Oral TID WC   nystatin cream   Topical BID   pantoprazole  40 mg Oral BID AC   rifaximin  550 mg Oral BID   rOPINIRole  0.25 mg Oral QHS   spironolactone  25 mg Oral Daily    tamsulosin  0.4 mg Oral Daily   venlafaxine XR  75 mg Oral Q breakfast   Continuous Infusions:  sodium chloride 10 mL/hr at 10/06/22 2121   meropenem (MERREM) IV 1 g (10/07/22 0606)     LOS: 7 days   Time spent: 35 minutes  Carollee Herter, DO  [facility]  10/07/2022, 10:29 AM

## 2022-10-07 NOTE — Progress Notes (Signed)
Mobility Specialist - Progress Note   10/07/22 1043  Mobility  Activity Ambulated with assistance in hallway  Level of Assistance Modified independent, requires aide device or extra time  Assistive Device Front wheel walker  Distance Ambulated (ft) 500 ft  Activity Response Tolerated well  Mobility Referral Yes  $Mobility charge 1 Mobility  Mobility Specialist Start Time (ACUTE ONLY) 1021  Mobility Specialist Stop Time (ACUTE ONLY) 1043  Mobility Specialist Time Calculation (min) (ACUTE ONLY) 22 min   Pt received in bed and agreeable to mobility. No complaints during session. Pt to bed after session with all needs met.    [facility]

## 2022-10-07 NOTE — Consult Note (Addendum)
Gynecology Consult Note   Date of Consult: 10/07/2022   Requesting Provider: [facility]  Primary OBGYN: None Primary Care Provider: [name]  Reason for Consult: Recurrent post menopausal bleeding  History of Present Illness: [name] is a 67 y.o. G1P1001 (LMP: age 89), with the above CC.  Patient states her periods ended at age 14 and denies any h/o HRT. She states that starting approximately four weeks ago she started having PMB, no pain, and she denies any prior PMB before this. She was inpatient for another reason and GYN was consulted and recommended megace bid; 8/13 TVUS showed a 12cm uterus with multiple 2-5cm fibroids with ES poorly seen and unable to be evaluated; she was discharged on 8/30, and she states that she continued on the bid megace and had no bleeding. She was scheduled to see GYN Dr. Briscoe Deutscher on 9/25, but she was re-admitted for AMS, cellulitis on 9/20 and her megace was not continued, and she had repeat VB; GYN consulted this morning and recommended to restart the bid megace, with first dose at 1430 today and to start pad counts  Patient states that she's had VB today.   ROS: A 12-point review of systems was performed and negative, except as stated in the above HPI.  OBGYN History: As per HPI. OB History  Gravida Para Term Preterm AB Living  1 1 1     1   SAB IAB Ectopic Multiple Live Births          1    # Outcome Date GA Lbr Len/2nd Weight Sex Type Anes PTL Lv  1 Term      CS-Unspec      Patient states she was a patient of Dr. Primitivo Gauze in [facility] until he retired 2 years ago, but she denies any issues or h/o abnormal pap smears.    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  Social History:  Social History   Socioeconomic History   Marital status: Single    Spouse name: Not on file   Number of children: 1   Years of education: Not on file   Highest education level: Not on file  Occupational History   Not on file  Tobacco Use   Smoking status: Former    Current packs/day: 0.00    Types: Cigarettes    Quit date: 9    Years since quitting: 37.7    Passive exposure: Past   Smokeless tobacco: Never  Vaping Use   Vaping status: Never Used  Substance and Sexual Activity   Alcohol use: No   Drug use: No   Sexual  activity: Not Currently    Birth control/protection: Post-menopausal  Other Topics Concern   Not on file  Social History Narrative   Not on file   Social Determinants of Health   Financial Resource Strain: Not on file  Food Insecurity: No Food Insecurity (09/30/2022)   Hunger Vital Sign    Worried About Running Out of Food in the Last Year: Never true    Ran Out of Food in the Last Year: Never true  Transportation Needs: No Transportation Needs (09/30/2022)   PRAPARE - Administrator, Civil Service (Medical): No    Lack of Transportation (Non-Medical): No  Physical Activity: Not on file  Stress: Not on file  Social Connections: Not on file  Intimate Partner Violence: Not At Risk (09/30/2022)   Humiliation, Afraid, Rape, and Kick questionnaire    Fear of Current or Ex-Partner: No    Emotionally Abused: No    Physically Abused: No    Sexually Abused: No    Allergy: Allergies  Allergen Reactions   Ace Inhibitors  Anaphylaxis and Swelling   Ivp Dye [Iodinated Contrast Media] Anaphylaxis   Desitin [Zinc Oxide] Other (See Comments)    Worsens rash   Chlorhexidine Rash and Other (See Comments)    WORSENS RASHES   Doxycycline Rash   Penicillins Rash   Zofran [Ondansetron] Rash    Current Outpatient Medications: Medications Prior to Admission  Medication Sig Dispense Refill Last Dose   acetaminophen (TYLENOL) 325 MG tablet Take 650 mg by mouth every 6 (six) hours as needed for moderate pain or headache.   09/29/2022   atorvastatin (LIPITOR) 40 MG tablet Take 40 mg by mouth in the morning.   09/29/2022   ciclopirox (PENLAC) 8 % solution Apply 1 application  topically See admin instructions. Apply over all toenails and surrounding skin at bedtime. Apply daily over previous coat. After seven (7) days, may remove with alcohol and continue cycle.   09/28/2022   colchicine 0.6 MG tablet Take 0.6 mg by mouth daily as needed (gout).   Past Month   cyanocobalamin (VITAMIN B12) 1000 MCG tablet Take 2,000 mcg by mouth every Friday.   Past Week   dicyclomine (BENTYL) 10 MG capsule Take 1 capsule (10 mg total) by mouth 3 (three) times daily as needed for spasms (abdominal cramping). 90 capsule 3 09/29/2022   EPINEPHrine 0.3 mg/0.3 mL IJ SOAJ injection Inject 0.3 mg into the muscle as needed for anaphylaxis.   unknown   folic acid (FOLVITE) 1 MG tablet Take 1 tablet (1 mg total) by mouth daily. 30 tablet 0 09/29/2022   furosemide (LASIX) 40 MG tablet Take 1 tablet (40 mg total) by mouth 2 (two) times daily. 60 tablet 2 09/29/2022   HYDROcodone-acetaminophen (NORCO) 10-325 MG tablet Take 1 tablet by mouth every 6 (six) hours as needed for moderate pain or severe pain.   09/28/2022   hydrOXYzine (ATARAX/VISTARIL) 25 MG tablet Take 25 mg by mouth 3 (three) times daily as needed for itching.   09/29/2022   IRON-VITAMIN C PO Take 1 tablet by mouth daily with breakfast.   09/29/2022   lactulose (CHRONULAC) 10 GM/15ML solution Take 30  mLs (20 g total) by mouth 3 (three) times daily. 946 mL 2 09/30/2022   levothyroxine (SYNTHROID) 150 MCG tablet Take 150 mcg by mouth daily before breakfast.   09/29/2022   magnesium gluconate (MAGONATE) 500 MG tablet Take 1,000 mg by mouth daily.   09/29/2022   methocarbamol (ROBAXIN) 500 MG  tablet Take 500 mg by mouth in the morning.   09/29/2022   midodrine (PROAMATINE) 10 MG tablet Take 1 tablet (10 mg total) by mouth 3 (three) times daily with meals. 90 tablet 3 09/29/2022   naloxone (NARCAN) nasal spray 4 mg/0.1 mL Place 1 spray into the nose as needed (accidental overdose).   unknown   nystatin cream (MYCOSTATIN) Apply topically 2 (two) times daily. (Patient taking differently: Apply 1 Application topically 2 (two) times daily as needed for dry skin (or irritation).) 30 g 0 09/29/2022   nystatin powder Apply 1 Application topically 3 (three) times daily as needed (for irritation- affected areas).   09/29/2022   pantoprazole (PROTONIX) 40 MG tablet Take 1 tablet (40 mg total) by mouth 2 (two) times daily before a meal. 60 tablet 3 09/29/2022   potassium chloride (KLOR-CON) 10 MEQ tablet Take 20 mEq by mouth in the morning and at bedtime.   09/29/2022   promethazine (PHENERGAN) 12.5 MG tablet Take 12.5 mg by mouth every 6 (six) hours as needed for nausea or vomiting.   09/28/2022   rifaximin (XIFAXAN) 550 MG TABS tablet Take 550 mg by mouth 2 (two) times daily.   09/29/2022   rOPINIRole (REQUIP) 0.25 MG tablet Take 1 tablet (0.25 mg total) by mouth at bedtime. 30 tablet 0 09/28/2022   spironolactone (ALDACTONE) 25 MG tablet Take 1 tablet (25 mg total) by mouth daily. 30 tablet 3 09/29/2022   tamsulosin (FLOMAX) 0.4 MG CAPS capsule Take 1 capsule (0.4 mg total) by mouth daily. 30 capsule 1 09/29/2022   venlafaxine XR (EFFEXOR-XR) 75 MG 24 hr capsule Take 75 mg by mouth daily with breakfast.   09/29/2022   Vitamin D, Ergocalciferol, (DRISDOL) 50000 UNITS CAPS Take 50,000 Units by mouth every Wednesday.    09/29/2022   triamcinolone cream (KENALOG) 0.5 % Apply to affected area of right breast twice daily as directed. (Patient not taking: Reported on 09/30/2022) 30 g 0 Not Taking     Hospital Medications: Current Facility-Administered Medications  Medication Dose Route Frequency Provider Last Rate Last Admin   0.9 %  sodium chloride infusion   Intravenous PRN Carollee Herter, DO   Stopped at 10/07/22 0026   acetaminophen (TYLENOL) tablet 500 mg  500 mg Oral Q6H PRN Bobette Mo, MD   500 mg at 10/05/22 2159   Or   acetaminophen (TYLENOL) suppository 650 mg  650 mg Rectal Q8H PRN Bobette Mo, MD       atorvastatin (LIPITOR) tablet 40 mg  40 mg Oral q AM Candelaria Stagers T, MD   40 mg at 10/07/22 0601   cyanocobalamin (VITAMIN B12) tablet 2,000 mcg  2,000 mcg Oral Q Fri Bobette Mo, MD   2,000 mcg at 10/07/22 1053   folic acid (FOLVITE) tablet 1 mg  1 mg Oral Daily Bobette Mo, MD   1 mg at 10/07/22 1052   furosemide (LASIX) tablet 40 mg  40 mg Oral BID Candelaria Stagers T, MD   40 mg at 10/07/22 1729   hydrOXYzine (ATARAX) tablet 25 mg  25 mg Oral TID PRN Bobette Mo, MD   25 mg at 10/07/22 1431   lactulose (CHRONULAC) 10 GM/15ML solution 20 g  20 g Oral BID PRN Bobette Mo, MD       lactulose [facility]) 10 GM/15ML solution 30 g  30 g Oral BID Bobette Mo, MD   30 g at 10/04/22 2130   levothyroxine (SYNTHROID) tablet 150  mcg  150 mcg Oral QAC breakfast Bobette Mo, MD   150 mcg at 10/07/22 0601   magnesium gluconate (MAGONATE) tablet 500 mg  500 mg Oral Daily Bobette Mo, MD   500 mg at 10/07/22 1053   megestrol (MEGACE) tablet 40 mg  40 mg Oral BID Carollee Herter, DO   40 mg at 10/07/22 1422   meropenem (MERREM) 1 g in sodium chloride 0.9 % 100 mL IVPB  1 g Intravenous Q8H Carollee Herter, DO   Stopped at 10/07/22 1504   midodrine (PROAMATINE) tablet 10 mg  10 mg Oral TID WC Bobette Mo, MD   10 mg at 10/07/22 1728   nystatin cream  (MYCOSTATIN)   Topical BID Bobette Mo, MD   Given at 10/07/22 1453   Oral care mouth rinse  15 mL Mouth Rinse PRN Candelaria Stagers T, MD       oxyCODONE (Oxy IR/ROXICODONE) immediate release tablet 5 mg  5 mg Oral Q4H PRN Bobette Mo, MD   5 mg at 10/07/22 0251   pantoprazole (PROTONIX) EC tablet 40 mg  40 mg Oral BID AC Bobette Mo, MD   40 mg at 10/07/22 1729   prochlorperazine (COMPAZINE) injection 10 mg  10 mg Intravenous Q6H PRN Bobette Mo, MD   10 mg at 10/03/22 1127   rifaximin (XIFAXAN) tablet 550 mg  550 mg Oral BID Bobette Mo, MD   550 mg at 10/07/22 1052   rOPINIRole (REQUIP) tablet 0.25 mg  0.25 mg Oral QHS Bobette Mo, MD   0.25 mg at 10/06/22 2133   spironolactone (ALDACTONE) tablet 25 mg  25 mg Oral Daily Candelaria Stagers T, MD   25 mg at 10/07/22 1053   tamsulosin (FLOMAX) capsule 0.4 mg  0.4 mg Oral Daily Bobette Mo, MD   0.4 mg at 10/07/22 1053   venlafaxine XR (EFFEXOR-XR) 24 hr capsule 75 mg  75 mg Oral Q breakfast Bobette Mo, MD   75 mg at 10/07/22 6045     Physical Exam:  Current Vital Signs 24h Vital Sign Ranges  T 97.8 F (36.6 C) Temp  Avg: 98.1 F (36.7 C)  Min: 97.8 F (36.6 C)  Max: 98.4 F (36.9 C)  BP (!) 102/55 BP  Min: 102/55  Max: 106/57  HR 79 Pulse  Avg: 77.7  Min: 75  Max: 79  RR 18 Resp  Avg: 18  Min: 18  Max: 18  SaO2 100 % Room Air SpO2  Avg: 99 %  Min: 97 %  Max: 100 %       24 Hour I/O Current Shift I/O  Time Ins Outs 09/26 0701 - 09/27 0700 In: 218 [P.O.:118] Out: -  09/27 0701 - 09/27 1900 In: 325.9 [I.V.:25.9] Out: -    Patient Vitals for the past 24 hrs:  BP Temp Temp src Pulse Resp SpO2  10/07/22 1214 (!) 102/55 97.8 F (36.6 C) Oral 79 -- 100 %  10/07/22 0525 (!) 106/57 98.4 F (36.9 C) -- 75 18 97 %  10/06/22 1921 102/60 98.2 F (36.8 C) -- 79 18 100 %    Body mass index is 39.31 kg/m. General appearance: Well nourished, well developed female in no acute distress.   GU: pad in bathroom with light, scant, non saturated blood on it over an approximately 4-5in area; pad with pad on and scant old blood on it Respiratory:  Normal respiratory effort Abdomen: obese, soft, nttp  Neuro/Psych:  Normal mood and affect.  Skin:  Warm and dry.  Extremities: no clubbing, cyanosis, or edema.    Laboratory: Recent Labs  Lab 10/03/22 0403 10/05/22 0609 10/07/22 0518  WBC 6.2 6.2 5.4  HGB 8.0* 8.3* 7.4*  HCT 26.8* 28.2* 24.3*  PLT 123* 139* 126*   Recent Labs  Lab 10/01/22 0419 10/02/22 0419 10/03/22 0403 10/05/22 0609 10/06/22 0845  NA 133* 134* 135 135 137  K 3.5 3.2* 3.5 3.7 3.3*  CL 105 105 109 110 111  CO2 21* 22 20* 18* 20*  BUN 26* 29* 28* 23 22  CREATININE 1.26* 1.17* 1.06* 0.88 0.86  CALCIUM 7.9* 7.5* 7.9* 7.8* 7.8*  PROT 4.9* 4.8*  --   --  5.2*  BILITOT 1.4* 0.9  --   --  1.3*  ALKPHOS 117 108  --   --  106  ALT 22 22  --   --  33  AST 42* 37  --   --  53*  GLUCOSE 95 124* 123* 100* 100*    Imaging:  No new imaging Narrative & Impression  CLINICAL DATA:  Postmenopausal bleeding.   EXAM: TRANSABDOMINAL AND TRANSVAGINAL ULTRASOUND OF PELVIS   TECHNIQUE: Both transabdominal and transvaginal ultrasound examinations of the pelvis were performed. Transabdominal technique was performed for global imaging of the pelvis including uterus, ovaries, adnexal regions, and pelvic cul-de-sac. It was necessary to proceed with endovaginal exam following the transabdominal exam to visualize the endometrium and ovaries.   COMPARISON:  Ultrasound dated 11/06/2011.   FINDINGS: Evaluation is limited due to limited transvaginal exam as the patient was not able to tolerate transvaginal imaging.   Uterus   Measurements: 12.0 x 6.1 x 10.7 cm = volume: 411 ML. The uterus is poorly visualized. Several fibroids including a 4.8 x 3.7 x 4.4 cm right posterior body intramural fibroid, a 5.3 x 4.8 x 6.0 cm anterior upper body and a 2.8 x 2.7 x 3.1 cm  anterior mid body submucosal fibroids.   Endometrium   The endometrium is poorly visualized and not evaluated.   Right ovary   Not visualized.   Left ovary   Not visualized.   Other findings   No abnormal free fluid.   IMPRESSION: 1. Myomatous uterus. 2. Nonvisualization of the endometrium and ovaries.     Electronically Signed   By: Elgie Collard M.D.   On: 08/23/2022 20:43    Assessment: Ms. Baldridge is a 67 y.o. G1P1001 with recurrent PMB; pt currently stable  Plan: D/w her that I recommend continuing on the bid megace until seen in the office, and hopefully it stops the bleeding like before. I told her I don't anticipate needing to do anything while inpatient, but she will need a pap smear and endometrial biopsy as an outpatient; she has 10/24 with Dr. Briscoe Deutscher already.   Recommend continuing with pad counts for the next 1-2 days and to call GYN if VB increases or doesn't taper off.   GYN team signing off.   Total time taking care of the patient was 35 minutes, with greater than 50% of the time spent in face to face interaction with the patient.  Cornelia Copa MD Attending Center for [facility] GYN Consult Phone: [phone] (M-F, 0800-1700) & [phone] (Off hours, weekends, holidays)

## 2022-10-08 DIAGNOSIS — K219 Gastro-esophageal reflux disease without esophagitis: Secondary | ICD-10-CM | POA: Diagnosis not present

## 2022-10-08 DIAGNOSIS — A419 Sepsis, unspecified organism: Secondary | ICD-10-CM | POA: Diagnosis not present

## 2022-10-08 DIAGNOSIS — E876 Hypokalemia: Secondary | ICD-10-CM

## 2022-10-08 DIAGNOSIS — A4152 Sepsis due to Pseudomonas: Secondary | ICD-10-CM | POA: Diagnosis not present

## 2022-10-08 DIAGNOSIS — G9341 Metabolic encephalopathy: Secondary | ICD-10-CM | POA: Diagnosis not present

## 2022-10-08 LAB — TYPE AND SCREEN
ABO/RH(D): AB NEG
Antibody Screen: NEGATIVE

## 2022-10-08 LAB — CBC WITH DIFFERENTIAL/PLATELET
Abs Immature Granulocytes: 0.01 10*3/uL (ref 0.00–0.07)
Basophils Absolute: 0 10*3/uL (ref 0.0–0.1)
Basophils Relative: 1 %
Eosinophils Absolute: 0.5 10*3/uL (ref 0.0–0.5)
Eosinophils Relative: 9 %
HCT: 23.6 % — ABNORMAL LOW (ref 36.0–46.0)
Hemoglobin: 7.3 g/dL — ABNORMAL LOW (ref 12.0–15.0)
Immature Granulocytes: 0 %
Lymphocytes Relative: 31 %
Lymphs Abs: 1.7 10*3/uL (ref 0.7–4.0)
MCH: 29.8 pg (ref 26.0–34.0)
MCHC: 30.9 g/dL (ref 30.0–36.0)
MCV: 96.3 fL (ref 80.0–100.0)
Monocytes Absolute: 0.6 10*3/uL (ref 0.1–1.0)
Monocytes Relative: 11 %
Neutro Abs: 2.7 10*3/uL (ref 1.7–7.7)
Neutrophils Relative %: 48 %
Platelets: 126 10*3/uL — ABNORMAL LOW (ref 150–400)
RBC: 2.45 MIL/uL — ABNORMAL LOW (ref 3.87–5.11)
RDW: 16.1 % — ABNORMAL HIGH (ref 11.5–15.5)
WBC: 5.5 10*3/uL (ref 4.0–10.5)
nRBC: 0 % (ref 0.0–0.2)

## 2022-10-08 LAB — COMPREHENSIVE METABOLIC PANEL
ALT: 42 U/L (ref 0–44)
AST: 66 U/L — ABNORMAL HIGH (ref 15–41)
Albumin: 2 g/dL — ABNORMAL LOW (ref 3.5–5.0)
Alkaline Phosphatase: 99 U/L (ref 38–126)
Anion gap: 4 — ABNORMAL LOW (ref 5–15)
BUN: 23 mg/dL (ref 8–23)
CO2: 24 mmol/L (ref 22–32)
Calcium: 7.7 mg/dL — ABNORMAL LOW (ref 8.9–10.3)
Chloride: 108 mmol/L (ref 98–111)
Creatinine, Ser: 0.91 mg/dL (ref 0.44–1.00)
GFR, Estimated: >60 mL/min (ref >60)
Glucose, Bld: 96 mg/dL (ref 70–99)
Potassium: 3.1 mmol/L — ABNORMAL LOW (ref 3.5–5.1)
Sodium: 136 mmol/L (ref 135–145)
Total Bilirubin: 1.2 mg/dL (ref 0.3–1.2)
Total Protein: 5.2 g/dL — ABNORMAL LOW (ref 6.5–8.1)

## 2022-10-08 LAB — MAGNESIUM: Magnesium: 1.5 mg/dL — ABNORMAL LOW (ref 1.7–2.4)

## 2022-10-08 MED ORDER — MAGNESIUM SULFATE 2 GM/50ML IV SOLN
2.0000 g | Freq: Once | INTRAVENOUS | Status: AC
  Administered 2022-10-08: 2 g via INTRAVENOUS
  Filled 2022-10-08: qty 50

## 2022-10-08 MED ORDER — POTASSIUM CHLORIDE CRYS ER 20 MEQ PO TBCR
40.0000 meq | EXTENDED_RELEASE_TABLET | ORAL | Status: AC
  Administered 2022-10-08 (×2): 40 meq via ORAL
  Filled 2022-10-08 (×2): qty 2

## 2022-10-08 NOTE — Assessment & Plan Note (Signed)
Replete with po kcl. Repeat BMP in AM.

## 2022-10-08 NOTE — Progress Notes (Signed)
Mobility Specialist - Progress Note   10/08/22 1340  Mobility  Activity Ambulated with assistance in hallway  Level of Assistance Modified independent, requires aide device or extra time  Assistive Device Front wheel walker  Distance Ambulated (ft) 265 ft  Activity Response Tolerated well  Mobility Referral Yes  $Mobility charge 1 Mobility  Mobility Specialist Start Time (ACUTE ONLY) 0126  Mobility Specialist Stop Time (ACUTE ONLY) 0139  Mobility Specialist Time Calculation (min) (ACUTE ONLY) 13 min   Pt received in bed and agreeable to mobility. No complaints during session. Pt to bed after session with all needs met.     [facility]

## 2022-10-08 NOTE — Plan of Care (Signed)
  Problem: Fluid Volume: Goal: Hemodynamic stability will improve Outcome: Progressing   Problem: Education: Goal: Knowledge of General Education information will improve Description: Including pain rating scale, medication(s)/side effects and non-pharmacologic comfort measures Outcome: Progressing   Problem: Health Behavior/Discharge Planning: Goal: Ability to manage health-related needs will improve Outcome: Progressing   Problem: Clinical Measurements: Goal: Ability to maintain clinical measurements within normal limits will improve Outcome: Progressing   Problem: Nutrition: Goal: Adequate nutrition will be maintained Outcome: Progressing   Problem: Coping: Goal: Level of anxiety will decrease Outcome: Progressing   Problem: Pain Managment: Goal: General experience of comfort will improve Outcome: Progressing   Problem: Safety: Goal: Ability to remain free from injury will improve Outcome: Progressing

## 2022-10-08 NOTE — Progress Notes (Addendum)
PROGRESS NOTE    [name]  EXB:[id number] DOB: 05/04/1955 DOA: 09/30/2022 PCP: Kara Hoit, MD  Subjective: Pt seen and examined. Pt is stable. Pt's son [name] is not bedside.  Pt will need 10 days of IV meropenem for pseudomonas septicemia from blood cx. Urine cx negative.  GYN consult note reviewed and is appreciated.  Pt now on Megace 40 mg bid per GYN recs. Pt to f/u with outpatient GYN on 11-03-2022 with [name] as scheduled.  Overall, doing well. Pt states she walked in the hallway yesterday. Plans to walk some more today.   Hospital Course: HPI: [name] is a 67 y.o. female with medical history significant of chronic diarrhea, nonalcoholic liver cirrhosis, heart murmur, hypertension, cholelithiasis, class III obesity, pulmonary embolism, short gut syndrome, hypothyroidism who presented to the emergency department due to altered mental status.  Per patient's son, she started acting incoherently this morning but is now closer to baseline.  She usually does these mental changes when she has an UTI or hyperammonemia.  She is currently complaining of back pain but denied any other complaints.   100.1 F, pulse 7016, respirations 32, BP 98/59 mmHg O2 sat 95% on room air.  The patient received 2000 mL of LR bolus, 500 mL of normal saline bolus followed by LR at 150 mL/h and ceftriaxone 2 g IVPB.  Subsequently, the maintenance fluids were held due to concerns of pulmonary edema and resolution of hypotension.  Significant Events: Admitted 09/30/2022 for acute metabolic encephalopathy, sepsis from UTI 10-07-2022 pt still having vaginal bleeding over the last 3 days. DVT prophylaxis Lovenox stopped on 10-03-2022. Pt's son [name] requested GYN consult.  Significant Labs: Admitting WBC 15.2 Admitting blood cx 09-30-2022 growing ESBL pseudomonas 10-05-2022 HgB 8.3 g/dl 01-12-7251 HgB 7.3 g/dl  Significant Imaging Studies:   Antibiotic Therapy: Anti-infectives (From admission,  onward)    Start     Dose/Rate Route Frequency Ordered Stop   10/03/22 1100  meropenem (MERREM) 1 g in sodium chloride 0.9 % 100 mL IVPB        1 g 200 mL/hr over 30 Minutes Intravenous Every 8 hours 10/03/22 1014 10/11/22 1359   10/01/22 1130  ceFEPIme (MAXIPIME) 2 g in sodium chloride 0.9 % 100 mL IVPB  Status:  Discontinued        2 g 200 mL/hr over 30 Minutes Intravenous Every 8 hours 10/01/22 1043 10/03/22 1014   09/30/22 2200  rifaximin (XIFAXAN) tablet 550 mg        550 mg Oral 2 times daily 09/30/22 1805     09/30/22 0830  cefTRIAXone (ROCEPHIN) 2 g in sodium chloride 0.9 % 100 mL IVPB  Status:  Discontinued        2 g 200 mL/hr over 30 Minutes Intravenous Every 24 hours 09/30/22 0823 10/01/22 1043       Procedures:   Consultants: Infectious Disease GYN - for vaginal bleeding    Assessment and Plan: * Severe sepsis (HCC) Admitted for severe sepsis due to UTI. Now resolved.  Septicemia due to Pseudomonas (HCC) - ESBL On IV meropenem. Will need 10 days of IV Abx. End date 10-11-2021. Pt and family unable to manage home IV Abx. Will need to stay inpatient to complete IV ABX course.  Acute metabolic encephalopathy Due to severe sepsis and UTI. Resolved. Pt is AxOx4  Morbid obesity (HCC) BMI 39.4  Short gut syndrome Has chronic diarrhea.  Hyperammonemia (HCC) Stable. Resolved  Hypotension Due to severe sepsis and UTI. Resolved  with IVF.  Hypothyroidism Stable. On synthroid 150 mcg.  Thrombocytopenia (HCC) Resolved. Due to sepsis.  GERD (gastroesophageal reflux disease) Stable. On PPI.  Iron deficiency anemia Stable.  Cirrhosis due to NASH Stable.  Hypomagnesemia Will order 2 gram IV Mg. Repeat Mg level in AM.  Vaginal bleeding Pt with hx of uterine fibroids. Last dose of lovenox on 10-03-2022.  GYN consulted on 10-08-2022.  Appreciate GYN consult and recommendations. Pt will remain on Megace 40 mgm bid. And f/u with outpatient GYN.   HgB down to  7.3 today. Was 8.3 g/dl on [id number].  DNR (do not resuscitate) Pt is DNR/DNI.  Hypokalemia Replete with po kcl. Repeat BMP in AM.   DVT prophylaxis: Place and maintain sequential compression device Start: 10/04/22 1234 SCDs Start: 09/30/22 1241    Code Status: Do not attempt resuscitation (DNR) PRE-ARREST INTERVENTIONS DESIRED Family Communication: no family at bedside Disposition Plan: return home Reason for continuing need for hospitalization: remains on IV abx  Objective: Vitals:   10/07/22 2059 10/08/22 0132 10/08/22 0558 10/08/22 1207  BP: (!) 101/55  (!) 100/58 (!) 107/57  Pulse: 81  83 81  Resp: 18  18 19   Temp: 98.3 F (36.8 C)  98.1 F (36.7 C) 98.7 F (37.1 C)  TempSrc: Oral  Oral   SpO2: 97%  98% 98%  Weight:  99.5 kg    Height:        Intake/Output Summary (Last 24 hours) at 10/08/2022 1447 Last data filed at 10/08/2022 1000 Gross per 24 hour  Intake 805.85 ml  Output --  Net 805.85 ml   Filed Weights   10/05/22 0547 10/06/22 0500 10/08/22 0132  Weight: 97.7 kg 97.5 kg 99.5 kg    Examination:  Physical Exam Vitals and nursing note reviewed.  Constitutional:      General: She is not in acute distress.    Appearance: She is obese. She is not toxic-appearing or diaphoretic.     Comments: Sitting up in bed. Eating breakfast.  HENT:     Head: Normocephalic and atraumatic.     Nose: Nose normal.  Eyes:     General: No scleral icterus.    Comments: Cloudy left corneal  Cardiovascular:     Rate and Rhythm: Normal rate and regular rhythm.  Pulmonary:     Effort: Pulmonary effort is normal.     Breath sounds: Normal breath sounds.  Abdominal:     General: Bowel sounds are normal. There is no distension.  Skin:    General: Skin is warm and dry.     Capillary Refill: Capillary refill takes less than 2 seconds.  Neurological:     Mental Status: She is alert and oriented to person, place, and time.     Data Reviewed: I have personally reviewed  following labs and imaging studies  CBC: Recent Labs  Lab 10/02/22 0419 10/03/22 0403 10/05/22 0609 10/07/22 0518 10/08/22 0717  WBC 6.9 6.2 6.2 5.4 5.5  NEUTROABS  --   --   --  2.7 2.7  HGB 7.4* 8.0* 8.3* 7.4* 7.3*  HCT 23.9* 26.8* 28.2* 24.3* 23.6*  MCV 99.2 100.4* 102.9* 96.4 96.3  PLT 106* 123* 139* 126* 126*   Basic Metabolic Panel: Recent Labs  Lab 10/02/22 0419 10/03/22 0403 10/05/22 0609 10/06/22 0845 10/08/22 0717  NA 134* 135 135 137 136  K 3.2* 3.5 3.7 3.3* 3.1*  CL 105 109 110 111 108  CO2 22 20* 18* 20* 24  GLUCOSE 124* 123*  100* 100* 96  BUN 29* 28* 23 22 23   CREATININE 1.17* 1.06* 0.88 0.86 0.91  CALCIUM 7.5* 7.9* 7.8* 7.8* 7.7*  MG 1.6* 2.1 1.8 1.6* 1.5*  PHOS 3.8 3.7 2.9  --   --    GFR: Estimated Creatinine Clearance: 68 mL/min (by C-G formula based on SCr of 0.91 mg/dL). Liver Function Tests: Recent Labs  Lab 10/02/22 0419 10/03/22 0403 10/05/22 0609 10/06/22 0845 10/08/22 0717  AST 37  --   --  53* 66*  ALT 22  --   --  33 42  ALKPHOS 108  --   --  106 99  BILITOT 0.9  --   --  1.3* 1.2  PROT 4.8*  --   --  5.2* 5.2*  ALBUMIN 1.9* 2.2* 2.1* 2.0* 2.0*    Recent Labs  Lab 10/02/22 0419  AMMONIA 48*    Recent Results (from the past 240 hour(s))  SARS Coronavirus 2 by RT PCR (hospital order, performed in [facility] hospital lab) *cepheid single result test* Anterior Nasal Swab     Status: None   Collection Time: 09/30/22  7:45 AM   Specimen: Anterior Nasal Swab  Result Value Ref Range Status   SARS Coronavirus 2 by RT PCR NEGATIVE NEGATIVE Final    Comment: (NOTE) SARS-CoV-2 target nucleic acids are NOT DETECTED.  The SARS-CoV-2 RNA is generally detectable in upper and lower respiratory specimens during the acute phase of infection. The lowest concentration of SARS-CoV-2 viral copies this assay can detect is 250 copies / mL. A negative result does not preclude SARS-CoV-2 infection and should not be used as the sole basis for  treatment or other patient management decisions.  A negative result may occur with improper specimen collection / handling, submission of specimen other than nasopharyngeal swab, presence of viral mutation(s) within the areas targeted by this assay, and inadequate number of viral copies (<250 copies / mL). A negative result must be combined with clinical observations, patient history, and epidemiological information.  Fact Sheet for Patients:   RoadLapTop.co.za  Fact Sheet for Healthcare Providers: http://kim-miller.com/  This test is not yet approved or  cleared by the [place] FDA and has been authorized for detection and/or diagnosis of SARS-CoV-2 by FDA under an Emergency Use Authorization (EUA).  This EUA will remain in effect (meaning this test can be used) for the duration of the COVID-19 declaration under Section 564(b)(1) of the Act, 21 U.S.C. section 360bbb-3(b)(1), unless the authorization is terminated or revoked sooner.  Performed at [facility], [address], [place], [place] [address]   Blood culture (routine x 2)     Status: Abnormal   Collection Time: 09/30/22  9:30 AM   Specimen: BLOOD RIGHT HAND  Result Value Ref Range Status   Specimen Description BLOOD RIGHT HAND  Final   Special Requests   Final    BOTTLES DRAWN AEROBIC AND ANAEROBIC Blood Culture adequate volume   Culture  Setup Time   Final    GRAM NEGATIVE RODS AEROBIC BOTTLE ONLY CRITICAL RESULT CALLED TO, READ BACK BY AND VERIFIED WITH: PHARMD Cherylin Mylar on [id number] @1021H  BY SM    Culture PSEUDOMONAS AERUGINOSA (A)  Final   Report Status 10/03/2022 FINAL  Final   Organism ID, Bacteria PSEUDOMONAS AERUGINOSA  Final      Susceptibility   Pseudomonas aeruginosa - MIC*    CEFTAZIDIME 16 INTERMEDIATE Intermediate     CIPROFLOXACIN 1 INTERMEDIATE Intermediate     GENTAMICIN  8 INTERMEDIATE Intermediate     IMIPENEM 2 SENSITIVE  Sensitive     CEFEPIME Value in next row Resistant      RESISTANTPerformed at [facility], [address] [place], [place] [address]    * PSEUDOMONAS AERUGINOSA  Blood Culture ID Panel (Reflexed)     Status: Abnormal   Collection Time: 09/30/22  9:30 AM  Result Value Ref Range Status   Enterococcus faecalis NOT DETECTED NOT DETECTED Final   Enterococcus Faecium NOT DETECTED NOT DETECTED Final   Listeria monocytogenes NOT DETECTED NOT DETECTED Final   Staphylococcus species NOT DETECTED NOT DETECTED Final   Staphylococcus aureus (BCID) NOT DETECTED NOT DETECTED Final   Staphylococcus epidermidis NOT DETECTED NOT DETECTED Final   Staphylococcus lugdunensis NOT DETECTED NOT DETECTED Final   Streptococcus species NOT DETECTED NOT DETECTED Final   Streptococcus agalactiae NOT DETECTED NOT DETECTED Final   Streptococcus pneumoniae NOT DETECTED NOT DETECTED Final   Streptococcus pyogenes NOT DETECTED NOT DETECTED Final   A.calcoaceticus-baumannii NOT DETECTED NOT DETECTED Final   Bacteroides fragilis NOT DETECTED NOT DETECTED Final   Enterobacterales NOT DETECTED NOT DETECTED Final   Enterobacter cloacae complex NOT DETECTED NOT DETECTED Final   Escherichia coli NOT DETECTED NOT DETECTED Final   Klebsiella aerogenes NOT DETECTED NOT DETECTED Final   Klebsiella oxytoca NOT DETECTED NOT DETECTED Final   Klebsiella pneumoniae NOT DETECTED NOT DETECTED Final   Proteus species NOT DETECTED NOT DETECTED Final   Salmonella species NOT DETECTED NOT DETECTED Final   Serratia marcescens NOT DETECTED NOT DETECTED Final   Haemophilus influenzae NOT DETECTED NOT DETECTED Final   Neisseria meningitidis NOT DETECTED NOT DETECTED Final   Pseudomonas aeruginosa (A) NOT DETECTED Final    CRITICAL RESULT CALLED TO, READ BACK BY AND VERIFIED WITH:    Comment: PHARMD S.DAVIS AT 1021 ON 10/01/2022 BY SM. DETECTED    Stenotrophomonas maltophilia NOT DETECTED NOT DETECTED Final   Candida albicans NOT  DETECTED NOT DETECTED Final   Candida auris NOT DETECTED NOT DETECTED Final   Candida glabrata NOT DETECTED NOT DETECTED Final   Candida krusei NOT DETECTED NOT DETECTED Final   Candida parapsilosis NOT DETECTED NOT DETECTED Final   Candida tropicalis NOT DETECTED NOT DETECTED Final   Cryptococcus neoformans/gattii NOT DETECTED NOT DETECTED Final   CTX-M ESBL NOT DETECTED NOT DETECTED Final   Carbapenem resistance IMP NOT DETECTED NOT DETECTED Final   Carbapenem resistance KPC NOT DETECTED NOT DETECTED Final   Carbapenem resistance NDM NOT DETECTED NOT DETECTED Final   Carbapenem resist OXA 48 LIKE NOT DETECTED NOT DETECTED Final   Carbapenem resistance VIM NOT DETECTED NOT DETECTED Final    Comment: Performed at [facility], [address] [place], [place] [address]  Blood culture (routine x 2)     Status: Abnormal   Collection Time: 09/30/22  9:49 AM   Specimen: BLOOD LEFT ARM  Result Value Ref Range Status   Specimen Description   Final    BLOOD LEFT ARM Performed at [facility], [address], [place], [place] [address]    Special Requests   Final    BOTTLES DRAWN AEROBIC AND ANAEROBIC Blood Culture adequate volume Performed at [facility], [address], [place], [place] [address]    Culture  Setup Time   Final    GRAM NEGATIVE RODS AEROBIC BOTTLE ONLY CRITICAL VALUE NOTED.  VALUE IS CONSISTENT WITH PREVIOUSLY REPORTED AND CALLED VALUE.  Culture (A)  Final    PSEUDOMONAS AERUGINOSA SUSCEPTIBILITIES PERFORMED ON PREVIOUS CULTURE WITHIN THE LAST 5 DAYS. Performed at [facility], [address] [place], [place] [address]    Report Status 10/03/2022 FINAL  Final  Urine Culture     Status: None   Collection Time: 09/30/22 11:26 AM   Specimen: Urine, Random  Result Value Ref Range Status   Specimen Description   Final    URINE, RANDOM Performed at [facility], [address], [place], [place] [address]     Special Requests   Final    NONE Reflexed from [id number] Performed at [facility], [address], [place], [place] [address]    Culture   Final    NO GROWTH Performed at [facility], [address], [place], [place] [address]    Report Status 10/01/2022 FINAL  Final  Culture, blood (Routine X 2) w Reflex to ID Panel     Status: None (Preliminary result)   Collection Time: 10/04/22 10:19 AM   Specimen: BLOOD  Result Value Ref Range Status   Specimen Description   Final    BLOOD SITE NOT SPECIFIED Performed at [facility], [address] [place], [place] [address]    Special Requests   Final    BOTTLES DRAWN AEROBIC AND ANAEROBIC Blood Culture adequate volume Performed at [facility], [address] [place], [place] [address]    Culture   Final    NO GROWTH 4 DAYS Performed at [facility], [address] [place], [place] [address]    Report Status PENDING  Incomplete  Culture, blood (Routine X 2) w Reflex to ID Panel     Status: None (Preliminary result)   Collection Time: 10/04/22 10:19 AM   Specimen: BLOOD RIGHT HAND  Result Value Ref Range Status   Specimen Description   Final    BLOOD RIGHT HAND Performed at [facility], [address], [place], [place] [address]    Special Requests   Final    BOTTLES DRAWN AEROBIC ONLY Blood Culture adequate volume Performed at [facility], [address] [place], [place] [address]    Culture   Final    NO GROWTH 4 DAYS Performed at [facility], [address], [place], [place] [address]    Report Status PENDING  Incomplete     Radiology Studies: No results found.  Scheduled Meds:  atorvastatin  40 mg Oral q AM   cyanocobalamin  2,000 mcg Oral Q Fri   folic acid  1 mg Oral Daily   furosemide  40 mg Oral BID   lactulose  30 g Oral BID   levothyroxine  150 mcg Oral QAC breakfast   magnesium gluconate  500 mg Oral Daily    megestrol  40 mg Oral BID   midodrine  10 mg Oral TID WC   nystatin cream   Topical BID   pantoprazole  40 mg Oral BID AC   potassium chloride  40 mEq Oral Q4H   rifaximin  550 mg Oral BID   rOPINIRole  0.25 mg Oral QHS   spironolactone  25 mg Oral Daily   tamsulosin  0.4 mg Oral Daily   venlafaxine XR  75 mg Oral Q breakfast   Continuous Infusions:  sodium chloride Stopped (10/07/22 0026)   magnesium sulfate bolus IVPB     meropenem (MERREM) IV 1  g (10/08/22 1312)     LOS: 8 days   Time spent: 35 minutes  Carollee Herter, DO  [facility]  10/08/2022, 2:47 PM

## 2022-10-08 NOTE — Assessment & Plan Note (Signed)
Will order 2 gram IV Mg. Repeat Mg level in AM.

## 2022-10-08 NOTE — Plan of Care (Signed)
  Problem: Clinical Measurements: Goal: Signs and symptoms of infection will decrease Outcome: Progressing   Problem: Education: Goal: Knowledge of General Education information will improve Description: Including pain rating scale, medication(s)/side effects and non-pharmacologic comfort measures Outcome: Progressing   Problem: Clinical Measurements: Goal: Will remain free from infection Outcome: Progressing Goal: Respiratory complications will improve Outcome: Progressing   Problem: Activity: Goal: Risk for activity intolerance will decrease Outcome: Progressing   Problem: Nutrition: Goal: Adequate nutrition will be maintained Outcome: Progressing   Problem: Pain Managment: Goal: General experience of comfort will improve Outcome: Progressing   Problem: Safety: Goal: Ability to remain free from injury will improve Outcome: Progressing   Problem: Skin Integrity: Goal: Risk for impaired skin integrity will decrease Outcome: Progressing

## 2022-10-09 DIAGNOSIS — D509 Iron deficiency anemia, unspecified: Secondary | ICD-10-CM | POA: Diagnosis not present

## 2022-10-09 DIAGNOSIS — A4152 Sepsis due to Pseudomonas: Secondary | ICD-10-CM | POA: Diagnosis not present

## 2022-10-09 DIAGNOSIS — K7460 Unspecified cirrhosis of liver: Secondary | ICD-10-CM | POA: Diagnosis not present

## 2022-10-09 DIAGNOSIS — A419 Sepsis, unspecified organism: Secondary | ICD-10-CM | POA: Diagnosis not present

## 2022-10-09 LAB — COMPREHENSIVE METABOLIC PANEL
ALT: 40 U/L (ref 0–44)
AST: 62 U/L — ABNORMAL HIGH (ref 15–41)
Albumin: 1.9 g/dL — ABNORMAL LOW (ref 3.5–5.0)
Alkaline Phosphatase: 101 U/L (ref 38–126)
Anion gap: 4 — ABNORMAL LOW (ref 5–15)
BUN: 23 mg/dL (ref 8–23)
CO2: 22 mmol/L (ref 22–32)
Calcium: 7.7 mg/dL — ABNORMAL LOW (ref 8.9–10.3)
Chloride: 110 mmol/L (ref 98–111)
Creatinine, Ser: 0.84 mg/dL (ref 0.44–1.00)
GFR, Estimated: >60 mL/min (ref >60)
Glucose, Bld: 108 mg/dL — ABNORMAL HIGH (ref 70–99)
Potassium: 4 mmol/L (ref 3.5–5.1)
Sodium: 136 mmol/L (ref 135–145)
Total Bilirubin: 1.4 mg/dL — ABNORMAL HIGH (ref 0.3–1.2)
Total Protein: 5.1 g/dL — ABNORMAL LOW (ref 6.5–8.1)

## 2022-10-09 LAB — CBC WITH DIFFERENTIAL/PLATELET
Abs Immature Granulocytes: 0.01 10*3/uL (ref 0.00–0.07)
Basophils Absolute: 0.1 10*3/uL (ref 0.0–0.1)
Basophils Relative: 1 %
Eosinophils Absolute: 0.4 10*3/uL (ref 0.0–0.5)
Eosinophils Relative: 8 %
HCT: 23.9 % — ABNORMAL LOW (ref 36.0–46.0)
Hemoglobin: 7.4 g/dL — ABNORMAL LOW (ref 12.0–15.0)
Immature Granulocytes: 0 %
Lymphocytes Relative: 32 %
Lymphs Abs: 1.5 10*3/uL (ref 0.7–4.0)
MCH: 29.6 pg (ref 26.0–34.0)
MCHC: 31 g/dL (ref 30.0–36.0)
MCV: 95.6 fL (ref 80.0–100.0)
Monocytes Absolute: 0.5 10*3/uL (ref 0.1–1.0)
Monocytes Relative: 11 %
Neutro Abs: 2.3 10*3/uL (ref 1.7–7.7)
Neutrophils Relative %: 48 %
Platelets: 141 10*3/uL — ABNORMAL LOW (ref 150–400)
RBC: 2.5 MIL/uL — ABNORMAL LOW (ref 3.87–5.11)
RDW: 15.9 % — ABNORMAL HIGH (ref 11.5–15.5)
WBC: 4.7 10*3/uL (ref 4.0–10.5)
nRBC: 0 % (ref 0.0–0.2)

## 2022-10-09 LAB — CULTURE, BLOOD (ROUTINE X 2)
Culture: NO GROWTH
Culture: NO GROWTH

## 2022-10-09 LAB — MAGNESIUM: Magnesium: 1.8 mg/dL (ref 1.7–2.4)

## 2022-10-09 MED ORDER — MAGNESIUM GLUCONATE 500 MG PO TABS
500.0000 mg | ORAL_TABLET | Freq: Two times a day (BID) | ORAL | Status: DC
  Administered 2022-10-09 – 2022-10-12 (×6): 500 mg via ORAL
  Filled 2022-10-09 (×6): qty 1

## 2022-10-09 NOTE — Progress Notes (Signed)
Mobility Specialist - Progress Note   10/09/22 1100  Mobility  Activity Ambulated with assistance in hallway  Level of Assistance Contact guard assist, steadying assist  Assistive Device Front wheel walker  Distance Ambulated (ft) 210 ft  Range of Motion/Exercises Active  Activity Response Tolerated well  Mobility Referral Yes  $Mobility charge 1 Mobility  Mobility Specialist Start Time (ACUTE ONLY) 1055  Mobility Specialist Stop Time (ACUTE ONLY) 1111  Mobility Specialist Time Calculation (min) (ACUTE ONLY) 16 min   Pt received in bed and agreed to mobility, first ambulated to restroom then into hall with no issues throughout session. Returned to bed chair with all needs met.  Marilynne Halsted Mobility Specialist

## 2022-10-09 NOTE — Plan of Care (Signed)
  Problem: Fluid Volume: Goal: Hemodynamic stability will improve Outcome: Progressing   Problem: Clinical Measurements: Goal: Signs and symptoms of infection will decrease Outcome: Progressing   Problem: Respiratory: Goal: Ability to maintain adequate ventilation will improve Outcome: Progressing   Problem: Education: Goal: Knowledge of General Education information will improve Description: Including pain rating scale, medication(s)/side effects and non-pharmacologic comfort measures Outcome: Progressing   Problem: Clinical Measurements: Goal: Ability to maintain clinical measurements within normal limits will improve Outcome: Progressing   Problem: Pain Managment: Goal: General experience of comfort will improve Outcome: Progressing   Problem: Safety: Goal: Ability to remain free from injury will improve Outcome: Progressing

## 2022-10-09 NOTE — Progress Notes (Signed)
PROGRESS NOTE    [name]  WUJ:[id number] DOB: 12-06-1955 DOA: 09/30/2022 PCP: Kara Hoit, MD  Subjective: Pt seen and examined. Pt is stable. Pt's son [name] is not bedside.  Pt will need 10 days of IV meropenem for pseudomonas septicemia from blood cx. Urine cx negative.  Pt states her vaginal bleeding in slowing down on Megace 40 mg bid.  Pt to f/u with outpatient GYN on 11-03-2022 with Dr. Briscoe [name] as scheduled.  Overall, doing well. Pt states she walked in the hallway yesterday. Plans to walk some more today.   Hospital Course: HPI: [name] is a 67 y.o. female with medical history significant of chronic diarrhea, nonalcoholic liver cirrhosis, heart murmur, hypertension, cholelithiasis, class III obesity, pulmonary embolism, short gut syndrome, hypothyroidism who presented to the emergency department due to altered mental status.  Per patient's son, she started acting incoherently this morning but is now closer to baseline.  She usually does these mental changes when she has an UTI or hyperammonemia.  She is currently complaining of back pain but denied any other complaints.   100.1 F, pulse 7016, respirations 32, BP 98/59 mmHg O2 sat 95% on room air.  The patient received 2000 mL of LR bolus, 500 mL of normal saline bolus followed by LR at 150 mL/h and ceftriaxone 2 g IVPB.  Subsequently, the maintenance fluids were held due to concerns of pulmonary edema and resolution of hypotension.  Significant Events: Admitted 09/30/2022 for acute metabolic encephalopathy, sepsis from UTI 10-07-2022 pt still having vaginal bleeding over the last 3 days. DVT prophylaxis Lovenox stopped on 10-03-2022. Pt's son [name] requested GYN consult. 10-07-2022 started Megace 40 mg bid for vaginal bleeding.  Significant Labs: Admitting WBC 15.2 Admitting blood cx 09-30-2022 growing ESBL pseudomonas 10-05-2022 HgB 8.3 g/dl 9-56-2130 HgB 7.3 g/dl 8-65-7846 HGB 7.4 d/gl  Significant Imaging  Studies:   Antibiotic Therapy: Anti-infectives (From admission, onward)    Start     Dose/Rate Route Frequency Ordered Stop   10/03/22 1100  meropenem (MERREM) 1 g in sodium chloride 0.9 % 100 mL IVPB        1 g 200 mL/hr over 30 Minutes Intravenous Every 8 hours 10/03/22 1014 10/11/22 1359   10/01/22 1130  ceFEPIme (MAXIPIME) 2 g in sodium chloride 0.9 % 100 mL IVPB  Status:  Discontinued        2 g 200 mL/hr over 30 Minutes Intravenous Every 8 hours 10/01/22 1043 10/03/22 1014   09/30/22 2200  rifaximin (XIFAXAN) tablet 550 mg        550 mg Oral 2 times daily 09/30/22 1805     09/30/22 0830  cefTRIAXone (ROCEPHIN) 2 g in sodium chloride 0.9 % 100 mL IVPB  Status:  Discontinued        2 g 200 mL/hr over 30 Minutes Intravenous Every 24 hours 09/30/22 0823 10/01/22 1043       Procedures:   Consultants: Infectious Disease GYN - for vaginal bleeding    Assessment and Plan: * Severe sepsis (HCC) Admitted for severe sepsis due to UTI. Now resolved.  Septicemia due to Pseudomonas (HCC) - ESBL On IV meropenem. Will need 10 days of IV Abx. End date 10-11-2021. Pt and family unable to manage home IV Abx. Will need to stay inpatient to complete IV ABX course.  Acute metabolic encephalopathy Due to severe sepsis and UTI. Resolved. Pt is AxOx4  Morbid obesity (HCC) BMI 39.4  Short gut syndrome Has chronic diarrhea.  Hyperammonemia (HCC) Stable. Resolved  Hypotension Due to severe sepsis and UTI. Resolved with IVF.  Hypothyroidism Stable. On synthroid 150 mcg.  Thrombocytopenia (HCC) Resolved. Due to sepsis.  GERD (gastroesophageal reflux disease) Stable. On PPI.  Iron deficiency anemia Stable.  Cirrhosis due to NASH Stable.  Hypomagnesemia Resolved with magnesium replacement.  Vaginal bleeding Pt with hx of uterine fibroids. Last dose of lovenox on 10-03-2022.  GYN consulted on 10-08-2022.  Appreciate GYN consult and recommendations. Pt will remain on Megace  40 mgm bid. And f/u with outpatient GYN.   HgB down to 7.4 today. Remains stable. Was 8.3 g/dl on [id number].  DNR (do not resuscitate) Pt is DNR/DNI.  Hypokalemia Replete with po kcl. Repeat BMP in AM.   DVT prophylaxis: Place and maintain sequential compression device Start: 10/04/22 1234 SCDs Start: 09/30/22 1241    Code Status: Do not attempt resuscitation (DNR) PRE-ARREST INTERVENTIONS DESIRED Family Communication: no family at bedside Disposition Plan: return home Reason for continuing need for hospitalization: remains on IV Abx.  Objective: Vitals:   10/08/22 1207 10/08/22 2150 10/09/22 0111 10/09/22 0601  BP: (!) 107/57 119/64  (!) 104/59  Pulse: 81 78  78  Resp: 19 18  18   Temp: 98.7 F (37.1 C) 98.3 F (36.8 C)  98 F (36.7 C)  TempSrc:  Oral    SpO2: 98% 98%  98%  Weight:   99.7 kg   Height:        Intake/Output Summary (Last 24 hours) at 10/09/2022 1124 Last data filed at 10/08/2022 1603 Gross per 24 hour  Intake 302.7 ml  Output --  Net 302.7 ml   Filed Weights   10/06/22 0500 10/08/22 0132 10/09/22 0111  Weight: 97.5 kg 99.5 kg 99.7 kg    Examination:  Physical Exam Vitals and nursing note reviewed.  Constitutional:      General: She is not in acute distress.    Appearance: She is obese. She is not toxic-appearing or diaphoretic.     Comments: Sitting up in bed. Eating breakfast.  HENT:     Head: Normocephalic and atraumatic.     Nose: Nose normal.  Eyes:     General: No scleral icterus.    Comments: Cloudy left corneal  Cardiovascular:     Rate and Rhythm: Normal rate and regular rhythm.  Pulmonary:     Effort: Pulmonary effort is normal.     Breath sounds: Normal breath sounds.  Abdominal:     General: Bowel sounds are normal. There is no distension.  Skin:    General: Skin is warm and dry.     Capillary Refill: Capillary refill takes less than 2 seconds.  Neurological:     Mental Status: She is alert and oriented to person, place,  and time.     Data Reviewed: I have personally reviewed following labs and imaging studies  CBC: Recent Labs  Lab 10/03/22 0403 10/05/22 0609 10/07/22 0518 10/08/22 0717 10/09/22 0709  WBC 6.2 6.2 5.4 5.5 4.7  NEUTROABS  --   --  2.7 2.7 2.3  HGB 8.0* 8.3* 7.4* 7.3* 7.4*  HCT 26.8* 28.2* 24.3* 23.6* 23.9*  MCV 100.4* 102.9* 96.4 96.3 95.6  PLT 123* 139* 126* 126* 141*   Basic Metabolic Panel: Recent Labs  Lab 10/03/22 0403 10/05/22 0609 10/06/22 0845 10/08/22 0717 10/09/22 0709  NA 135 135 137 136 136  K 3.5 3.7 3.3* 3.1* 4.0  CL 109 110 111 108 110  CO2 20* 18* 20* 24 22  GLUCOSE 123* 100*  100* 96 108*  BUN 28* 23 22 23 23   CREATININE 1.06* 0.88 0.86 0.91 0.84  CALCIUM 7.9* 7.8* 7.8* 7.7* 7.7*  MG 2.1 1.8 1.6* 1.5* 1.8  PHOS 3.7 2.9  --   --   --    GFR: Estimated Creatinine Clearance: 73.7 mL/min (by C-G formula based on SCr of 0.84 mg/dL). Liver Function Tests: Recent Labs  Lab 10/03/22 0403 10/05/22 0609 10/06/22 0845 10/08/22 0717 10/09/22 0709  AST  --   --  53* 66* 62*  ALT  --   --  33 42 40  ALKPHOS  --   --  106 99 101  BILITOT  --   --  1.3* 1.2 1.4*  PROT  --   --  5.2* 5.2* 5.1*  ALBUMIN 2.2* 2.1* 2.0* 2.0* 1.9*    Recent Results (from the past 240 hour(s))  SARS Coronavirus 2 by RT PCR (hospital order, performed in [facility] hospital lab) *cepheid single result test* Anterior Nasal Swab     Status: None   Collection Time: 09/30/22  7:45 AM   Specimen: Anterior Nasal Swab  Result Value Ref Range Status   SARS Coronavirus 2 by RT PCR NEGATIVE NEGATIVE Final    Comment: (NOTE) SARS-CoV-2 target nucleic acids are NOT DETECTED.  The SARS-CoV-2 RNA is generally detectable in upper and lower respiratory specimens during the acute phase of infection. The lowest concentration of SARS-CoV-2 viral copies this assay can detect is 250 copies / mL. A negative result does not preclude SARS-CoV-2 infection and should not be used as the sole basis  for treatment or other patient management decisions.  A negative result may occur with improper specimen collection / handling, submission of specimen other than nasopharyngeal swab, presence of viral mutation(s) within the areas targeted by this assay, and inadequate number of viral copies (<250 copies / mL). A negative result must be combined with clinical observations, patient history, and epidemiological information.  Fact Sheet for Patients:   RoadLapTop.co.za  Fact Sheet for Healthcare Providers: http://kim-miller.com/  This test is not yet approved or  cleared by the [place] FDA and has been authorized for detection and/or diagnosis of SARS-CoV-2 by FDA under an Emergency Use Authorization (EUA).  This EUA will remain in effect (meaning this test can be used) for the duration of the COVID-19 declaration under Section 564(b)(1) of the Act, 21 U.S.C. section 360bbb-3(b)(1), unless the authorization is terminated or revoked sooner.  Performed at [facility], [address], [place], [place] [address]   Blood culture (routine x 2)     Status: Abnormal   Collection Time: 09/30/22  9:30 AM   Specimen: BLOOD RIGHT HAND  Result Value Ref Range Status   Specimen Description BLOOD RIGHT HAND  Final   Special Requests   Final    BOTTLES DRAWN AEROBIC AND ANAEROBIC Blood Culture adequate volume   Culture  Setup Time   Final    GRAM NEGATIVE RODS AEROBIC BOTTLE ONLY CRITICAL RESULT CALLED TO, READ BACK BY AND VERIFIED WITH: PHARMD Cherylin Mylar on [id number] @1021H  BY SM    Culture PSEUDOMONAS AERUGINOSA (A)  Final   Report Status 10/03/2022 FINAL  Final   Organism ID, Bacteria PSEUDOMONAS AERUGINOSA  Final      Susceptibility   Pseudomonas aeruginosa - MIC*    CEFTAZIDIME 16 INTERMEDIATE Intermediate     CIPROFLOXACIN 1 INTERMEDIATE Intermediate     GENTAMICIN 8 INTERMEDIATE Intermediate     IMIPENEM 2 SENSITIVE  Sensitive     CEFEPIME Value in next row Resistant      RESISTANTPerformed at [facility], [address] [place], [place] [address]    * PSEUDOMONAS AERUGINOSA  Blood Culture ID Panel (Reflexed)     Status: Abnormal   Collection Time: 09/30/22  9:30 AM  Result Value Ref Range Status   Enterococcus faecalis NOT DETECTED NOT DETECTED Final   Enterococcus Faecium NOT DETECTED NOT DETECTED Final   Listeria monocytogenes NOT DETECTED NOT DETECTED Final   Staphylococcus species NOT DETECTED NOT DETECTED Final   Staphylococcus aureus (BCID) NOT DETECTED NOT DETECTED Final   Staphylococcus epidermidis NOT DETECTED NOT DETECTED Final   Staphylococcus lugdunensis NOT DETECTED NOT DETECTED Final   Streptococcus species NOT DETECTED NOT DETECTED Final   Streptococcus agalactiae NOT DETECTED NOT DETECTED Final   Streptococcus pneumoniae NOT DETECTED NOT DETECTED Final   Streptococcus pyogenes NOT DETECTED NOT DETECTED Final   A.calcoaceticus-baumannii NOT DETECTED NOT DETECTED Final   Bacteroides fragilis NOT DETECTED NOT DETECTED Final   Enterobacterales NOT DETECTED NOT DETECTED Final   Enterobacter cloacae complex NOT DETECTED NOT DETECTED Final   Escherichia coli NOT DETECTED NOT DETECTED Final   Klebsiella aerogenes NOT DETECTED NOT DETECTED Final   Klebsiella oxytoca NOT DETECTED NOT DETECTED Final   Klebsiella pneumoniae NOT DETECTED NOT DETECTED Final   Proteus species NOT DETECTED NOT DETECTED Final   Salmonella species NOT DETECTED NOT DETECTED Final   Serratia marcescens NOT DETECTED NOT DETECTED Final   Haemophilus influenzae NOT DETECTED NOT DETECTED Final   Neisseria meningitidis NOT DETECTED NOT DETECTED Final   Pseudomonas aeruginosa (A) NOT DETECTED Final    CRITICAL RESULT CALLED TO, READ BACK BY AND VERIFIED WITH:    Comment: PHARMD S.DAVIS AT 1021 ON 10/01/2022 BY SM. DETECTED    Stenotrophomonas maltophilia NOT DETECTED NOT DETECTED Final   Candida albicans NOT  DETECTED NOT DETECTED Final   Candida auris NOT DETECTED NOT DETECTED Final   Candida glabrata NOT DETECTED NOT DETECTED Final   Candida krusei NOT DETECTED NOT DETECTED Final   Candida parapsilosis NOT DETECTED NOT DETECTED Final   Candida tropicalis NOT DETECTED NOT DETECTED Final   Cryptococcus neoformans/gattii NOT DETECTED NOT DETECTED Final   CTX-M ESBL NOT DETECTED NOT DETECTED Final   Carbapenem resistance IMP NOT DETECTED NOT DETECTED Final   Carbapenem resistance KPC NOT DETECTED NOT DETECTED Final   Carbapenem resistance NDM NOT DETECTED NOT DETECTED Final   Carbapenem resist OXA 48 LIKE NOT DETECTED NOT DETECTED Final   Carbapenem resistance VIM NOT DETECTED NOT DETECTED Final    Comment: Performed at [facility], [address] [place], [place] [address]  Blood culture (routine x 2)     Status: Abnormal   Collection Time: 09/30/22  9:49 AM   Specimen: BLOOD LEFT ARM  Result Value Ref Range Status   Specimen Description   Final    BLOOD LEFT ARM Performed at [facility], [address] [place], [place] [address]    Special Requests   Final    BOTTLES DRAWN AEROBIC AND ANAEROBIC Blood Culture adequate volume Performed at [facility], [address], [place], [place] [address]    Culture  Setup Time   Final    GRAM NEGATIVE RODS AEROBIC BOTTLE ONLY CRITICAL VALUE NOTED.  VALUE IS CONSISTENT WITH PREVIOUSLY REPORTED AND CALLED VALUE.    Culture (A)  Final    PSEUDOMONAS AERUGINOSA SUSCEPTIBILITIES PERFORMED  ON PREVIOUS CULTURE WITHIN THE LAST 5 DAYS. Performed at [facility], [address], [place], [place] [address]    Report Status 10/03/2022 FINAL  Final  Urine Culture     Status: None   Collection Time: 09/30/22 11:26 AM   Specimen: Urine, Random  Result Value Ref Range Status   Specimen Description   Final    URINE, RANDOM Performed at [facility], [address], [place], [place] [address]     Special Requests   Final    NONE Reflexed from [id number] Performed at [facility], [address] [place], [place] [address]    Culture   Final    NO GROWTH Performed at [facility], [address], [place], [place] [address]    Report Status 10/01/2022 FINAL  Final  Culture, blood (Routine X 2) w Reflex to ID Panel     Status: None   Collection Time: 10/04/22 10:19 AM   Specimen: BLOOD  Result Value Ref Range Status   Specimen Description   Final    BLOOD SITE NOT SPECIFIED Performed at [facility], [address], [place], [place] [address]    Special Requests   Final    BOTTLES DRAWN AEROBIC AND ANAEROBIC Blood Culture adequate volume Performed at [facility], [address] [place], [place] [address]    Culture   Final    NO GROWTH 5 DAYS Performed at [facility], [address] [place], [place] [address]    Report Status 10/09/2022 FINAL  Final  Culture, blood (Routine X 2) w Reflex to ID Panel     Status: None   Collection Time: 10/04/22 10:19 AM   Specimen: BLOOD RIGHT HAND  Result Value Ref Range Status   Specimen Description   Final    BLOOD RIGHT HAND Performed at [facility], [address] [place], [place] [address]    Special Requests   Final    BOTTLES DRAWN AEROBIC ONLY Blood Culture adequate volume Performed at [facility], [address], [place], [place] [address]    Culture   Final    NO GROWTH 5 DAYS Performed at [facility], [address] [place], [place] [address]    Report Status 10/09/2022 FINAL  Final     Radiology Studies: No results found.  Scheduled Meds:  atorvastatin  40 mg Oral q AM   cyanocobalamin  2,000 mcg Oral Q Fri   folic acid  1 mg Oral Daily   furosemide  40 mg Oral BID   lactulose  30 g Oral BID   levothyroxine  150 mcg Oral QAC breakfast   magnesium gluconate  500 mg Oral BID   megestrol  40 mg Oral BID    midodrine  10 mg Oral TID WC   nystatin cream   Topical BID   pantoprazole  40 mg Oral BID AC   rifaximin  550 mg Oral BID   rOPINIRole  0.25 mg Oral QHS   spironolactone  25 mg Oral Daily   tamsulosin  0.4 mg Oral Daily   venlafaxine XR  75 mg Oral Q breakfast   Continuous Infusions:  sodium chloride Stopped (10/07/22 0026)   meropenem (MERREM) IV 1 g (10/09/22 0607)     LOS: 9 days   Time spent: 35 minutes  Carollee Herter, DO  [facility]  10/09/2022, 11:24 AM

## 2022-10-10 DIAGNOSIS — A4152 Sepsis due to Pseudomonas: Secondary | ICD-10-CM | POA: Diagnosis not present

## 2022-10-10 DIAGNOSIS — A419 Sepsis, unspecified organism: Secondary | ICD-10-CM | POA: Diagnosis not present

## 2022-10-10 DIAGNOSIS — K219 Gastro-esophageal reflux disease without esophagitis: Secondary | ICD-10-CM | POA: Diagnosis not present

## 2022-10-10 NOTE — Plan of Care (Signed)
  Problem: Fluid Volume: Goal: Hemodynamic stability will improve Outcome: Progressing   Problem: Clinical Measurements: Goal: Signs and symptoms of infection will decrease Outcome: Progressing   Problem: Clinical Measurements: Goal: Diagnostic test results will improve Outcome: Progressing   Problem: Activity: Goal: Risk for activity intolerance will decrease Outcome: Progressing   Problem: Nutrition: Goal: Adequate nutrition will be maintained Outcome: Progressing

## 2022-10-10 NOTE — Progress Notes (Signed)
Mobility Specialist - Progress Note   10/10/22 1346  Mobility  Activity Ambulated with assistance in hallway  Level of Assistance Contact guard assist, steadying assist  Assistive Device Front wheel walker  Distance Ambulated (ft) 220 ft  Range of Motion/Exercises Active  Activity Response Tolerated well  Mobility Referral Yes  $Mobility charge 1 Mobility  Mobility Specialist Start Time (ACUTE ONLY) 1330  Mobility Specialist Stop Time (ACUTE ONLY) 1345  Mobility Specialist Time Calculation (min) (ACUTE ONLY) 15 min   Pt received in bed and agreed to mobility. Had no issues throughout session, returned to bed with all needs met.  Marilynne Halsted Mobility Specialist

## 2022-10-10 NOTE — Progress Notes (Signed)
PROGRESS NOTE    [name]  ZOX:[id number] DOB: 03-Oct-1955 DOA: 09/30/2022 PCP: [name], MD  Subjective: Pt seen and examined. Pt is stable. I called pt's [name] by phone and gave him update.  Pt will need 10 days of IV meropenem for pseudomonas septicemia from blood cx. Urine cx negative.  Pt states her vaginal bleeding has improved on on Megace 40 mg bid.  Pt to f/u with outpatient GYN on 11-03-2022 with Dr. Briscoe Deutscher as scheduled.  Overall, doing well. Pt states she walked in the hallway yesterday 210 feet. Walked 220 feet today.   Hospital Course: HPI: [name] is a 67 y.o. female with medical history significant of chronic diarrhea, nonalcoholic liver cirrhosis, heart murmur, hypertension, cholelithiasis, class III obesity, pulmonary embolism, short gut syndrome, hypothyroidism who presented to the emergency department due to altered mental status.  Per patient's son, she started acting incoherently this morning but is now closer to baseline.  She usually does these mental changes when she has an UTI or hyperammonemia.  She is currently complaining of back pain but denied any other complaints.   100.1 F, pulse 7016, respirations 32, BP 98/59 mmHg O2 sat 95% on room air.  The patient received 2000 mL of LR bolus, 500 mL of normal saline bolus followed by LR at 150 mL/h and ceftriaxone 2 g IVPB.  Subsequently, the maintenance fluids were held due to concerns of pulmonary edema and resolution of hypotension.  Significant Events: Admitted 09/30/2022 for acute metabolic encephalopathy, sepsis from UTI 10-07-2022 pt still having vaginal bleeding over the last 3 days. DVT prophylaxis Lovenox stopped on 10-03-2022. Pt's [name] requested GYN consult. 10-07-2022 started Megace 40 mg bid for vaginal bleeding.  Significant Labs: Admitting WBC 15.2 Admitting blood cx 09-30-2022 growing ESBL pseudomonas 10-05-2022 HgB 8.3 g/dl 08-20-9145 HgB 7.3 g/dl 09-08-5619 HGB 7.4  d/gl  Significant Imaging Studies:   Antibiotic Therapy: Anti-infectives (From admission, onward)    Start     Dose/Rate Route Frequency Ordered Stop   10/03/22 1100  meropenem (MERREM) 1 g in sodium chloride 0.9 % 100 mL IVPB        1 g 200 mL/hr over 30 Minutes Intravenous Every 8 hours 10/03/22 1014 10/11/22 1359   10/01/22 1130  ceFEPIme (MAXIPIME) 2 g in sodium chloride 0.9 % 100 mL IVPB  Status:  Discontinued        2 g 200 mL/hr over 30 Minutes Intravenous Every 8 hours 10/01/22 1043 10/03/22 1014   09/30/22 2200  rifaximin (XIFAXAN) tablet 550 mg        550 mg Oral 2 times daily 09/30/22 1805     09/30/22 0830  cefTRIAXone (ROCEPHIN) 2 g in sodium chloride 0.9 % 100 mL IVPB  Status:  Discontinued        2 g 200 mL/hr over 30 Minutes Intravenous Every 24 hours 09/30/22 0823 10/01/22 1043       Procedures:   Consultants: Infectious Disease GYN - for vaginal bleeding    Assessment and Plan: * Severe sepsis (HCC) Admitted for severe sepsis due to UTI. Now resolved.  Septicemia due to Pseudomonas (HCC) - ESBL On IV meropenem. Will need 10 days of IV Abx. End date 10-11-2021. Pt and family unable to manage home IV Abx. Will need to stay inpatient to complete IV ABX course.  Acute metabolic encephalopathy Due to severe sepsis and UTI. Resolved. Pt is AxOx4  Morbid obesity (HCC) BMI 39.4  Short gut syndrome Has chronic diarrhea.  Hyperammonemia (HCC) Stable. Resolved  Hypotension Due to severe sepsis and UTI. Resolved with IVF.  Hypothyroidism Stable. On synthroid 150 mcg.  Thrombocytopenia (HCC) Resolved. Due to sepsis.  GERD (gastroesophageal reflux disease) Stable. On PPI.  Iron deficiency anemia Stable.  Cirrhosis due to NASH Stable.  Hypomagnesemia Resolved with magnesium replacement.  Vaginal bleeding Pt with hx of uterine fibroids. Last dose of lovenox on 10-03-2022.  GYN consulted on 10-08-2022.  Appreciate GYN consult and  recommendations. Pt will remain on Megace 40 mgm bid. And f/u with outpatient GYN.   HgB down to 7.4 on 10-09-2022. stable. Was 8.3 g/dl on [id number].  DNR (do not resuscitate) Pt is DNR/DNI.  Hypokalemia Repleted with po kcl. Resolved.   DVT prophylaxis: Place and maintain sequential compression device Start: 10/04/22 1234 SCDs Start: 09/30/22 1241    Code Status: Do not attempt resuscitation (DNR) PRE-ARREST INTERVENTIONS DESIRED Family Communication: called and gave update to pt's son [name] Disposition Plan: return home Reason for continuing need for hospitalization: remains on IV abx with meropenem  Objective: Vitals:   10/09/22 2010 10/10/22 0455 10/10/22 0458 10/10/22 1302  BP: (!) 122/58 123/62  (!) 104/58  Pulse: 75 80  80  Resp: 17 17    Temp: 98.2 F (36.8 C) 98.1 F (36.7 C)  98.4 F (36.9 C)  TempSrc: Oral Oral    SpO2: 96% 100%  100%  Weight:   100.7 kg   Height:       No intake or output data in the 24 hours ending 10/10/22 1415 Filed Weights   10/08/22 0132 10/09/22 0111 10/10/22 0458  Weight: 99.5 kg 99.7 kg 100.7 kg    Examination:  Physical Exam Vitals and nursing note reviewed.  Constitutional:      General: She is not in acute distress.    Appearance: She is obese. She is not toxic-appearing or diaphoretic.     Comments: Lying in bed. Waiting for her son to bring her lunch.  HENT:     Head: Normocephalic and atraumatic.     Nose: Nose normal.  Eyes:     General: No scleral icterus.    Comments: Cloudy left corneal  Cardiovascular:     Rate and Rhythm: Normal rate and regular rhythm.  Pulmonary:     Effort: Pulmonary effort is normal.     Breath sounds: Normal breath sounds.  Abdominal:     General: Bowel sounds are normal. There is no distension.  Skin:    General: Skin is warm and dry.     Capillary Refill: Capillary refill takes less than 2 seconds.  Neurological:     Mental Status: She is alert and oriented to person, place, and  time.    Data Reviewed: I have personally reviewed following labs and imaging studies  CBC: Recent Labs  Lab 10/05/22 0609 10/07/22 0518 10/08/22 0717 10/09/22 0709  WBC 6.2 5.4 5.5 4.7  NEUTROABS  --  2.7 2.7 2.3  HGB 8.3* 7.4* 7.3* 7.4*  HCT 28.2* 24.3* 23.6* 23.9*  MCV 102.9* 96.4 96.3 95.6  PLT 139* 126* 126* 141*   Basic Metabolic Panel: Recent Labs  Lab 10/05/22 0609 10/06/22 0845 10/08/22 0717 10/09/22 0709  NA 135 137 136 136  K 3.7 3.3* 3.1* 4.0  CL 110 111 108 110  CO2 18* 20* 24 22  GLUCOSE 100* 100* 96 108*  BUN 23 22 23 23   CREATININE 0.88 0.86 0.91 0.84  CALCIUM 7.8* 7.8* 7.7* 7.7*  MG 1.8 1.6*  1.5* 1.8  PHOS 2.9  --   --   --    GFR: Estimated Creatinine Clearance: 74.1 mL/min (by C-G formula based on SCr of 0.84 mg/dL). Liver Function Tests: Recent Labs  Lab 10/05/22 0609 10/06/22 0845 10/08/22 0717 10/09/22 0709  AST  --  53* 66* 62*  ALT  --  33 42 40  ALKPHOS  --  106 99 101  BILITOT  --  1.3* 1.2 1.4*  PROT  --  5.2* 5.2* 5.1*  ALBUMIN 2.1* 2.0* 2.0* 1.9*    Recent Results (from the past 240 hour(s))  Culture, blood (Routine X 2) w Reflex to ID Panel     Status: None   Collection Time: 10/04/22 10:19 AM   Specimen: BLOOD  Result Value Ref Range Status   Specimen Description   Final    BLOOD SITE NOT SPECIFIED Performed at [facility], [address] [place], [place] [address]    Special Requests   Final    BOTTLES DRAWN AEROBIC AND ANAEROBIC Blood Culture adequate volume Performed at [facility], [address], [place], [place] [address]    Culture   Final    NO GROWTH 5 DAYS Performed at [facility], [address], [place], [place] [address]    Report Status 10/09/2022 FINAL  Final  Culture, blood (Routine X 2) w Reflex to ID Panel     Status: None   Collection Time: 10/04/22 10:19 AM   Specimen: BLOOD RIGHT HAND  Result Value Ref Range Status   Specimen Description    Final    BLOOD RIGHT HAND Performed at [facility], [address] [place], [place] [address]    Special Requests   Final    BOTTLES DRAWN AEROBIC ONLY Blood Culture adequate volume Performed at [facility], [address], [place], [place] [address]    Culture   Final    NO GROWTH 5 DAYS Performed at [facility], [address], [place], [place] [address]    Report Status 10/09/2022 FINAL  Final     Radiology Studies: No results found.  Scheduled Meds:  atorvastatin  40 mg Oral q AM   cyanocobalamin  2,000 mcg Oral Q Fri   folic acid  1 mg Oral Daily   furosemide  40 mg Oral BID   lactulose  30 g Oral BID   levothyroxine  150 mcg Oral QAC breakfast   magnesium gluconate  500 mg Oral BID   megestrol  40 mg Oral BID   midodrine  10 mg Oral TID WC   nystatin cream   Topical BID   pantoprazole  40 mg Oral BID AC   rifaximin  550 mg Oral BID   rOPINIRole  0.25 mg Oral QHS   spironolactone  25 mg Oral Daily   tamsulosin  0.4 mg Oral Daily   venlafaxine XR  75 mg Oral Q breakfast   Continuous Infusions:  sodium chloride Stopped (10/07/22 0026)   meropenem (MERREM) IV 1 g (10/10/22 1305)     LOS: 10 days   Time spent: 35 minutes  Carollee Herter, DO  [facility]  10/10/2022, 2:15 PM

## 2022-10-11 DIAGNOSIS — G9341 Metabolic encephalopathy: Secondary | ICD-10-CM | POA: Diagnosis not present

## 2022-10-11 DIAGNOSIS — A4152 Sepsis due to Pseudomonas: Secondary | ICD-10-CM | POA: Diagnosis not present

## 2022-10-11 DIAGNOSIS — A419 Sepsis, unspecified organism: Secondary | ICD-10-CM | POA: Diagnosis not present

## 2022-10-11 MED ORDER — MEGESTROL ACETATE 40 MG PO TABS: 40.0000 mg | ORAL_TABLET | Freq: Two times a day (BID) | ORAL | 0 refills | Status: AC

## 2022-10-11 NOTE — Discharge Summary (Shared)
[facility] Physician Discharge Summary   Patient name: [name]  Admit date:     09/30/2022  Discharge date: 10/12/2022  Attending Physician: Bobette Mo [[id number]]  Discharge Physician: Carollee Herter   PCP: [name], MD  Admitted From: Home   Disposition:  Home  Recommendations for Outpatient Follow-up:  Follow up with PCP in 1-2 weeks F/U with GYN 11-03-2022 for vaginal bleeding as scheduled  Home Health:Yes. Home PT Equipment/Devices: None    Discharge Condition:Stable CODE STATUS:DNR/DNI Diet recommendation: Diabetic Fluid Restriction: None  Hospital Summary: HPI: [name] is a 67 y.o. female with medical history significant of chronic diarrhea, nonalcoholic liver cirrhosis, heart murmur, hypertension, cholelithiasis, class III obesity, pulmonary embolism, short gut syndrome, hypothyroidism who presented to the emergency department due to altered mental status.  Per patient's son, she started acting incoherently this morning but is now closer to baseline.  She usually does these mental changes when she has an UTI or hyperammonemia.  She is currently complaining of back pain but denied any other complaints.   100.1 F, pulse 7016, respirations 32, BP 98/59 mmHg O2 sat 95% on room air.  The patient received 2000 mL of LR bolus, 500 mL of normal saline bolus followed by LR at 150 mL/h and ceftriaxone 2 g IVPB.  Subsequently, the maintenance fluids were held due to concerns of pulmonary edema and resolution of hypotension.  Significant Events: Admitted 09/30/2022 for acute metabolic encephalopathy, sepsis from UTI 10-07-2022 pt still having vaginal bleeding over the last 3 days. DVT prophylaxis Lovenox stopped on 10-03-2022. Pt's [name] requested GYN consult. 10-07-2022 started Megace 40 mg bid for vaginal bleeding.  Significant Labs: Admitting WBC 15.2 Admitting blood cx 09-30-2022 growing ESBL pseudomonas 10-05-2022 HgB 8.3 g/dl [id number] HgB  7.3 g/dl 04-02-4008 HGB 7.4 d/gl  Significant Imaging Studies:   Antibiotic Therapy: Anti-infectives (From admission, onward)    Start     Dose/Rate Route Frequency Ordered Stop   10/03/22 1100  meropenem (MERREM) 1 g in sodium chloride 0.9 % 100 mL IVPB        1 g 200 mL/hr over 30 Minutes Intravenous Every 8 hours 10/03/22 1014 10/11/22 1359   10/01/22 1130  ceFEPIme (MAXIPIME) 2 g in sodium chloride 0.9 % 100 mL IVPB  Status:  Discontinued        2 g 200 mL/hr over 30 Minutes Intravenous Every 8 hours 10/01/22 1043 10/03/22 1014   09/30/22 2200  rifaximin (XIFAXAN) tablet 550 mg        550 mg Oral 2 times daily 09/30/22 1805     09/30/22 0830  cefTRIAXone (ROCEPHIN) 2 g in sodium chloride 0.9 % 100 mL IVPB  Status:  Discontinued        2 g 200 mL/hr over 30 Minutes Intravenous Every 24 hours 09/30/22 0823 10/01/22 1043       Procedures:   Consultants: Infectious Disease GYN - for vaginal bleeding   Hospital Course by Problem: * Severe sepsis (HCC) Admitted for severe sepsis due to UTI. Now resolved.  Septicemia due to Pseudomonas (HCC) - ESBL Rx w/ IV meropenem. Completed on 10-11-2022.  Repeat blood cx on 10-04-2022 are negative  Acute metabolic encephalopathy Due to severe sepsis and UTI. Resolved. Pt is AxOx4  Morbid obesity (HCC) BMI 39.4  Short gut syndrome Has chronic diarrhea.  Hyperammonemia (HCC) Stable. Resolved  Hypotension Due to severe sepsis and UTI. Resolved with IVF.  Hypothyroidism Stable. On synthroid 150 mcg.  Thrombocytopenia (HCC) Resolved.  Due to sepsis.  GERD (gastroesophageal reflux disease) Stable. On PPI.  Cirrhosis due to NASH Stable.  Continue Lasix and Aldactone, rifaximin  Hypomagnesemia Resolved with magnesium replacement.  Vaginal bleeding Iron deficiency anemia Pt with hx of uterine fibroids. GYN consulted on 10-08-2022.  Appreciate GYN consult and recommendations. Pt will remain on Megace 40 mgm bid. And f/u  with outpatient GYN.   HgB down to 7.4 on 10-09-2022. stable. Was 8.3 g/dl on [id number]. -Bleeding has stopped, advised to continue iron as well  DNR (do not resuscitate) Pt is DNR/DNI.  Hypokalemia Repleted with po kcl. Resolved.    Discharge Diagnoses:  Principal Problem:   Severe sepsis (HCC) Active Problems:   Septicemia due to Pseudomonas (HCC) - ESBL   Acute metabolic encephalopathy   Cirrhosis due to NASH   Iron deficiency anemia   GERD (gastroesophageal reflux disease)   Thrombocytopenia (HCC)   Hypothyroidism   Hypotension   Hyperammonemia (HCC)   Short gut syndrome   Morbid obesity (HCC)   Hypokalemia   DNR (do not resuscitate)   Vaginal bleeding   Hypomagnesemia   Discharge Instructions  Discharge Instructions     Call MD for:  difficulty breathing, headache or visual disturbances   Complete by: As directed    Call MD for:  extreme fatigue   Complete by: As directed    Call MD for:  persistant dizziness or light-headedness   Complete by: As directed    Call MD for:  persistant nausea and vomiting   Complete by: As directed    Call MD for:  redness, tenderness, or signs of infection (pain, swelling, redness, odor or green/yellow discharge around incision site)   Complete by: As directed    Call MD for:  temperature >100.4   Complete by: As directed    Diet - low sodium heart healthy   Complete by: As directed    Discharge instructions   Complete by: As directed    1. Follow up with PCP in 1-2 weeks following hospital discharge 2. Follow up with Gynecologist on 11-03-2022 @ 09:35 AM.. Dr. Briscoe Deutscher.   Increase activity slowly   Complete by: As directed    No wound care   Complete by: As directed       Allergies as of 10/11/2022       Reactions   Ace Inhibitors Anaphylaxis, Swelling   Ivp Dye [iodinated Contrast Media] Anaphylaxis   Desitin [zinc Oxide] Other (See Comments)   Worsens rash   Chlorhexidine Rash, Other (See Comments)   WORSENS  RASHES   Doxycycline Rash   Penicillins Rash   Zofran [ondansetron] Rash        Medication List     TAKE these medications    acetaminophen 325 MG tablet Commonly known as: TYLENOL Take 650 mg by mouth every 6 (six) hours as needed for moderate pain or headache.   atorvastatin 40 MG tablet Commonly known as: LIPITOR Take 40 mg by mouth in the morning.   ciclopirox 8 % solution Commonly known as: PENLAC Apply 1 application  topically See admin instructions. Apply over all toenails and surrounding skin at bedtime. Apply daily over previous coat. After seven (7) days, may remove with alcohol and continue cycle.   colchicine 0.6 MG tablet Take 0.6 mg by mouth daily as needed (gout).   cyanocobalamin 1000 MCG tablet Commonly known as: VITAMIN B12 Take 2,000 mcg by mouth every Friday.   dicyclomine 10 MG capsule Commonly known as: BENTYL Take  1 capsule (10 mg total) by mouth 3 (three) times daily as needed for spasms (abdominal cramping).   EPINEPHrine 0.3 mg/0.3 mL Soaj injection Commonly known as: EPI-PEN Inject 0.3 mg into the muscle as needed for anaphylaxis.   folic acid 1 MG tablet Commonly known as: FOLVITE Take 1 tablet (1 mg total) by mouth daily.   furosemide 40 MG tablet Commonly known as: LASIX Take 1 tablet (40 mg total) by mouth 2 (two) times daily.   HYDROcodone-acetaminophen 10-325 MG tablet Commonly known as: NORCO Take 1 tablet by mouth every 6 (six) hours as needed for moderate pain or severe pain.   hydrOXYzine 25 MG tablet Commonly known as: ATARAX Take 25 mg by mouth 3 (three) times daily as needed for itching.   IRON-VITAMIN C PO Take 1 tablet by mouth daily with breakfast.   lactulose 10 GM/15ML solution Commonly known as: CHRONULAC Take 30 mLs (20 g total) by mouth 3 (three) times daily.   levothyroxine 150 MCG tablet Commonly known as: SYNTHROID Take 150 mcg by mouth daily before breakfast.   magnesium gluconate 500 MG  tablet Commonly known as: MAGONATE Take 1,000 mg by mouth daily.   megestrol 40 MG tablet Commonly known as: MEGACE Take 1 tablet (40 mg total) by mouth 2 (two) times daily.   methocarbamol 500 MG tablet Commonly known as: ROBAXIN Take 500 mg by mouth in the morning.   midodrine 10 MG tablet Commonly known as: PROAMATINE Take 1 tablet (10 mg total) by mouth 3 (three) times daily with meals.   naloxone 4 MG/0.1ML Liqd nasal spray kit Commonly known as: NARCAN Place 1 spray into the nose as needed (accidental overdose).   nystatin powder Apply 1 Application topically 3 (three) times daily as needed (for irritation- affected areas). What changed: Another medication with the same name was changed. Make sure you understand how and when to take each.   nystatin cream Commonly known as: MYCOSTATIN Apply topically 2 (two) times daily. What changed:  how much to take when to take this reasons to take this   pantoprazole 40 MG tablet Commonly known as: PROTONIX Take 1 tablet (40 mg total) by mouth 2 (two) times daily before a meal.   potassium chloride 10 MEQ tablet Commonly known as: KLOR-CON Take 20 mEq by mouth in the morning and at bedtime.   promethazine 12.5 MG tablet Commonly known as: PHENERGAN Take 12.5 mg by mouth every 6 (six) hours as needed for nausea or vomiting.   rifaximin 550 MG Tabs tablet Commonly known as: XIFAXAN Take 550 mg by mouth 2 (two) times daily.   rOPINIRole 0.25 MG tablet Commonly known as: REQUIP Take 1 tablet (0.25 mg total) by mouth at bedtime.   spironolactone 25 MG tablet Commonly known as: ALDACTONE Take 1 tablet (25 mg total) by mouth daily.   tamsulosin 0.4 MG Caps capsule Commonly known as: FLOMAX Take 1 capsule (0.4 mg total) by mouth daily.   triamcinolone cream 0.5 % Commonly known as: KENALOG Apply to affected area of right breast twice daily as directed.   venlafaxine XR 75 MG 24 hr capsule Commonly known as:  EFFEXOR-XR Take 75 mg by mouth daily with breakfast.   Vitamin D (Ergocalciferol) 1.25 MG (50000 UNIT) Caps capsule Commonly known as: DRISDOL Take 50,000 Units by mouth every Wednesday.        Allergies  Allergen Reactions   Ace Inhibitors Anaphylaxis and Swelling   Ivp Dye [Iodinated Contrast Media] Anaphylaxis  Desitin [Zinc Oxide] Other (See Comments)    Worsens rash   Chlorhexidine Rash and Other (See Comments)    WORSENS RASHES   Doxycycline Rash   Penicillins Rash   Zofran [Ondansetron] Rash    Discharge Exam: Vitals:   10/10/22 1952 10/11/22 0454  BP: (!) 101/58 (!) 116/59  Pulse: 82 89  Resp: 17 18  Temp: 98.6 F (37 C) 98.8 F (37.1 C)  SpO2: 99% 98%    Physical Exam Vitals and nursing note reviewed.  Constitutional:      General: She is not in acute distress.    Appearance: She is obese. She is not toxic-appearing or diaphoretic.     Comments: Lying in bed.   HENT:     Head: Normocephalic and atraumatic.     Nose: Nose normal.  Eyes:     General: No scleral icterus.    Comments: Cloudy left corneal  Cardiovascular:     Rate and Rhythm: Normal rate and regular rhythm.  Pulmonary:     Effort: Pulmonary effort is normal.     Breath sounds: Normal breath sounds.  Abdominal:     General: Abdomen is protuberant. Bowel sounds are normal. There is no distension.  Skin:    General: Skin is warm and dry.     Capillary Refill: Capillary refill takes less than 2 seconds.  Neurological:     Mental Status: She is alert and oriented to person, place, and time.     The results of significant diagnostics from this hospitalization (including imaging, microbiology, ancillary and laboratory) are listed below for reference.    Microbiology: Recent Results (from the past 240 hour(s))  Culture, blood (Routine X 2) w Reflex to ID Panel     Status: None   Collection Time: 10/04/22 10:19 AM   Specimen: BLOOD  Result Value Ref Range Status   Specimen  Description   Final    BLOOD SITE NOT SPECIFIED Performed at [facility], [address] [place], [place] [address]    Special Requests   Final    BOTTLES DRAWN AEROBIC AND ANAEROBIC Blood Culture adequate volume Performed at [facility], [address], [place], [place] [address]    Culture   Final    NO GROWTH 5 DAYS Performed at [facility], [address] [place], [place] [address]    Report Status 10/09/2022 FINAL  Final  Culture, blood (Routine X 2) w Reflex to ID Panel     Status: None   Collection Time: 10/04/22 10:19 AM   Specimen: BLOOD RIGHT HAND  Result Value Ref Range Status   Specimen Description   Final    BLOOD RIGHT HAND Performed at [facility], [address], [place], [place] [address]    Special Requests   Final    BOTTLES DRAWN AEROBIC ONLY Blood Culture adequate volume Performed at [facility], [address], [place], [place] [address]    Culture   Final    NO GROWTH 5 DAYS Performed at [facility], [address], [place], [place] [address]    Report Status 10/09/2022 FINAL  Final     Labs: BNP (last 3 results) Recent Labs    08/30/22 0354  BNP 614.9*   Basic Metabolic Panel: Recent Labs  Lab 10/05/22 0609 10/06/22 0845 10/08/22 0717 10/09/22 0709  NA 135 137 136 136  K 3.7 3.3* 3.1* 4.0  CL 110 111 108 110  CO2 18* 20* 24 22  GLUCOSE 100* 100* 96 108*  BUN 23 22 23 23   CREATININE 0.88 0.86 0.91 0.84  CALCIUM 7.8* 7.8* 7.7* 7.7*  MG 1.8 1.6* 1.5* 1.8  PHOS 2.9  --   --   --    Liver Function Tests: Recent Labs  Lab 10/05/22 0609 10/06/22 0845 10/08/22 0717 10/09/22 0709  AST  --  53* 66* 62*  ALT  --  33 42 40  ALKPHOS  --  106 99 101  BILITOT  --  1.3* 1.2 1.4*  PROT  --  5.2* 5.2* 5.1*  ALBUMIN 2.1* 2.0* 2.0* 1.9*   CBC: Recent Labs  Lab 10/05/22 0609 10/07/22 0518 10/08/22 0717 10/09/22 0709  WBC 6.2 5.4 5.5 4.7  NEUTROABS  --   2.7 2.7 2.3  HGB 8.3* 7.4* 7.3* 7.4*  HCT 28.2* 24.3* 23.6* 23.9*  MCV 102.9* 96.4 96.3 95.6  PLT 139* 126* 126* 141*   Urinalysis    Component Value Date/Time   COLORURINE YELLOW 09/30/2022 1126   APPEARANCEUR HAZY (A) 09/30/2022 1126   LABSPEC 1.012 09/30/2022 1126   PHURINE 5.0 09/30/2022 1126   GLUCOSEU NEGATIVE 09/30/2022 1126   HGBUR LARGE (A) 09/30/2022 1126   BILIRUBINUR NEGATIVE 09/30/2022 1126   KETONESUR NEGATIVE 09/30/2022 1126   PROTEINUR 30 (A) 09/30/2022 1126   UROBILINOGEN 0.2 08/21/2013 0017   NITRITE NEGATIVE 09/30/2022 1126   LEUKOCYTESUR NEGATIVE 09/30/2022 1126   Sepsis Labs Recent Labs  Lab 10/05/22 0609 10/07/22 0518 10/08/22 0717 10/09/22 0709  WBC 6.2 5.4 5.5 4.7   Microbiology Specimen Description BLOOD RIGHT HAND  Special Requests BOTTLES DRAWN AEROBIC AND ANAEROBIC Blood Culture adequate volume  Culture  Setup Time GRAM NEGATIVE RODS AEROBIC BOTTLE ONLY CRITICAL RESULT CALLED TO, READ BACK BY AND VERIFIED WITH: PHARMD Cherylin Mylar on [id number] @1021H  BY SM  Culture PSEUDOMONAS AERUGINOSA Abnormal   Report Status 10/03/2022 FINAL  Organism ID, Bacteria PSEUDOMONAS AERUGINOSA  Resulting Agency [facility]     Susceptibility    Pseudomonas aeruginosa    MIC    CEFEPIME  Resistant 1    CEFTAZIDIME 16 INTERMED... Intermediate    CIPROFLOXACIN 1 INTERMEDI... Intermediate    GENTAMICIN 8 INTERMEDI... Intermediate    IMIPENEM 2 SENSITIVE Sensitive         Recent Results (from the past 240 hour(s))  Culture, blood (Routine X 2) w Reflex to ID Panel     Status: None   Collection Time: 10/04/22 10:19 AM   Specimen: BLOOD  Result Value Ref Range Status   Specimen Description   Final    BLOOD SITE NOT SPECIFIED Performed at [facility], [address], [place], [place] [address]    Special Requests   Final    BOTTLES DRAWN AEROBIC AND ANAEROBIC Blood Culture adequate volume Performed at [facility],  [address] [place], [place] [address]    Culture   Final    NO GROWTH 5 DAYS Performed at [facility], [address], [place], [place] [address]    Report Status 10/09/2022 FINAL  Final  Culture, blood (Routine X 2) w Reflex to ID Panel     Status: None   Collection Time: 10/04/22 10:19 AM   Specimen: BLOOD RIGHT HAND  Result Value Ref Range Status   Specimen Description   Final    BLOOD RIGHT HAND Performed at [facility], [address] [place], [place] [address]  Special Requests   Final    BOTTLES DRAWN AEROBIC ONLY Blood Culture adequate volume Performed at [facility], [address] [place], [place] [address]    Culture   Final    NO GROWTH 5 DAYS Performed at [facility], [address], [place], [place] [address]    Report Status 10/09/2022 FINAL  Final    Procedures/Studies: CT ABDOMEN PELVIS WO CONTRAST  Result Date: 10/01/2022 CLINICAL DATA:  Sepsis workup. EXAM: CT ABDOMEN AND PELVIS WITHOUT CONTRAST (ORAL CONTRAST ONLY) TECHNIQUE: Multidetector CT imaging of the abdomen and pelvis was performed following the standard protocol without IV contrast following oral contrast only. RADIATION DOSE REDUCTION: This exam was performed according to the departmental dose-optimization program which includes automated exposure control, adjustment of the mA and/or kV according to patient size and/or use of iterative reconstruction technique. COMPARISON:  Large number of prior CTs dating back to 2009. The 3 most recent are all without contrast and dated 09/04/2022, 08/25/2022 and 08/11/2022. FINDINGS: Lower chest: Small layering left and trace right layering pleural effusions are again noted and unchanged. There is linear atelectasis in the lung bases but no infiltrate. Mild-to-moderate cardiomegaly. Coronary arteries and the mitral ring are heavily calcified. The cardiac blood pool is less dense than the myocardium which may be seen with  anemia. No substantial pericardial effusion. Hepatobiliary: Liver again demonstrating capsular nodular changes consistent with cirrhosis. Loss of fine detail again seen due to body habitus causing beam hardening, but no focal masses seen through the beam hardening. The gallbladder is absent, without biliary dilatation. Pancreas: Partially atrophic but otherwise unremarkable without contrast. Spleen: Mildly prominent, 14.2 cm AP. No focal abnormality is seen through the beam hardening. Adrenals/Urinary Tract: No adrenal mass. No contour deforming mass of either kidney. 2.3 cm Bosniak 1 cyst again noted anterior limb of the left kidney, Hounsfield density is 8.3. No follow-up imaging recommended. 3 mm caliceal stone again is noted in the upper pole right kidney. No further nephrolithiasis. No obstructing stone or hydronephrosis. Unremarkable bladder for the degree of distention. Stomach/Bowel: Right hemicolectomy to the hepatic flexure. Patent surgical anastomosis. Interval increased distention of the stomach with food substrate and contrast. No significant gastric fold thickening. There are mild thickened folds in the small bowel in the left hemiabdomen, probably congestive or due to hepatic dysfunction, enteritis also possible but this has been seen previously. The wall of the large bowel is normal in thickness. Scattered uncomplicated sigmoid diverticulosis. Vascular/Lymphatic: Aortic and visceral branch vessel atherosclerosis. Patchy iliac atherosclerosis. No AAA. No adenopathy. Reproductive: Enlarged fibroid uterus with multiple calcified fibroids. No adnexal mass. No interval change in appearance. Other: Small left lower anterior pelvic wall hernia containing fluid and fat, unchanged. Rectus diastasis above the umbilicus is again noted with outward protrusion but no incarcerated hernia. Moderate body wall anasarca continues to be seen. Mild free ascites is also again noted but somewhat improved since 09/04/2022.  Diffuse mesenteric congestive features are chronically noted. Underlying peritonitis not strictly excluded but this seems unchanged. Musculoskeletal: Moderate anterior wedge compression fracture of the T12 vertebral body, first seen on 09/04/2022, has undergone further fragmentation anteriorly and increased anterior wedge height loss, with slight posterosuperior cortical retropulsion. Anterior height loss now is 60%, posterior height loss 30%, with increased vertebral body sclerosis. Other regional vertebra are normal in heights with osteopenia and advanced degenerative changes of the spine. No other significant osseous findings. Mild dextroscoliosis from the lower thoracic to lumbar region. IMPRESSION: 1. Cardiomegaly,  small left and trace right pleural effusions. No acute lower chest findings. 2. Likely anemia. 3. Cirrhotic liver with mild splenomegaly and mild ascites. 4. Diffuse mesenteric congestive features are chronically noted. Underlying peritonitis not strictly excluded but this has been seen previously. 5. Thickened small bowel folds left hemiabdomen, probably congestive or due to hepatic dysfunction, enteritis also possible. 6. Moderate anterior wedge compression fracture of the T12 vertebral body, first seen on 09/04/2022, has undergone further fragmentation anteriorly and increased anterior wedge height loss, with slight posterosuperior cortical retropulsion. 7. Aortic and heavy coronary artery atherosclerosis. 8. Enlarged fibroid uterus with calcified fibroids. 9. Small left lower anterior pelvic wall hernia containing fluid and fat, unchanged. Aortic Atherosclerosis (ICD10-I70.0). Electronically Signed   By: Almira Bar M.D.   On: 10/01/2022 22:55   CT HEAD WO CONTRAST  Result Date: 09/30/2022 CLINICAL DATA:  Mental status change, unknown cause EXAM: CT HEAD WITHOUT CONTRAST TECHNIQUE: Contiguous axial images were obtained from the base of the skull through the vertex without intravenous  contrast. RADIATION DOSE REDUCTION: This exam was performed according to the departmental dose-optimization program which includes automated exposure control, adjustment of the mA and/or kV according to patient size and/or use of iterative reconstruction technique. COMPARISON:  Head CT 06/12/2022 FINDINGS: Brain: No hemorrhage. No hydrocephalus. No extra-axial fluid collection. No CT evidence of an acute cortical infarct. No mass effect. No mass lesion. Vascular: No hyperdense vessel or unexpected calcification. Skull: Normal. Negative for fracture or focal lesion. Sinuses/Orbits: No middle ear or mastoid effusion. Paranasal sinuses are clear. Left lens replacement. Orbits are otherwise unremarkable. Other: None IMPRESSION: No acute intracranial abnormality. Sequela of mild chronic microvascular ischemic change Electronically Signed   By: Lorenza Cambridge M.D.   On: 09/30/2022 08:06   DG Chest Port 1 View  Result Date: 09/30/2022 CLINICAL DATA:  67 year old female with history of altered mental status. EXAM: PORTABLE CHEST 1 VIEW COMPARISON:  Chest x-ray 08/26/2022. FINDINGS: Lung volumes are low. No consolidative airspace disease. No pleural effusions. No pneumothorax. Cephalization of the pulmonary vasculature, without frank pulmonary edema. Heart size is mildly enlarged. Upper mediastinal contours are within normal limits. Atherosclerotic calcifications are noted in the thoracic aorta. IMPRESSION: 1. Cardiomegaly with pulmonary venous congestion, but no frank pulmonary edema. 2. Aortic atherosclerosis. Electronically Signed   By: Trudie Reed M.D.   On: 09/30/2022 07:47   IR Radiologist Eval & Mgmt  Result Date: 09/27/2022 EXAM: NEW PATIENT OFFICE VISIT CHIEF COMPLAINT: Refer to EMR HISTORY OF PRESENT ILLNESS: The patient was referred to the interventional radiology clinic for further evaluation and management of T3 and T5 symptomatic compression fractures. On review of patient's chart, the patient had  an attempted T3 and T5 kyphoplasty on August 31, 2022 by my colleague Dr. Baldemar Lenis. However, the procedure was aborted at that time due to poor visualization of important anatomic landmarks. In the interim since her discharge, the patient reports approximately 25-30 pound weight loss. She is still extremity symptomatic from her upper thoracic spine fractures. Given previous attempt, I will defer the patient to my neuro interventional colleagues as she may benefit from having the procedure done in the hospital with biplane equipment availability. I have reached out to our schedulers to help coordinate this effort. REVIEW OF SYSTEMS: Refer to EMR PHYSICAL EXAMINATION: Refer to EMR ASSESSMENT AND PLAN: Refer to EMR Electronically Signed   By: Olive Bass M.D.   On: 09/27/2022 14:57    Time coordinating discharge: 35 mins  SIGNED:  Carollee Herter, DO [facility] 10/11/22, 11:03 AM

## 2022-10-11 NOTE — TOC Progression Note (Addendum)
Transition of Care [facility]) - Progression Note    Patient Details  Name: [name] MRN: [id number] Date of Birth: 1955/10/14  Transition of Care [facility]) CM/SW Contact  Harriett Sine, RN Phone Number:[phone]  10/11/2022, 1:02 PM  Clinical Narrative:    Pt from home with [facility] services.  Spoke with pt at bedside about d/c home with [facility] services. Pt states she is active with [facility] and will be continuing services.  Spoke with [name] of [facility] to confirm pt is active and will continue.   Anticipant d/c tomorrow no further TOC needs.   Expected Discharge Plan: Home w Home Health Services Barriers to Discharge: Continued Medical Work up  Expected Discharge Plan and Services       Living arrangements for the past 2 months: Single Family Home Expected Discharge Date: 10/12/22                                     Social Determinants of Health (SDOH) Interventions SDOH Screenings   Food Insecurity: No Food Insecurity (09/30/2022)  Housing: Low Risk  (09/30/2022)  Transportation Needs: No Transportation Needs (09/30/2022)  Utilities: Not At Risk (09/30/2022)  Tobacco Use: Medium Risk (10/01/2022)    Readmission Risk Interventions    08/15/2022    3:20 PM 05/18/2022   10:24 AM 05/06/2022    3:39 PM  Readmission Risk Prevention Plan  Transportation Screening Complete Complete Complete  PCP or Specialist Appt within 5-7 Days  Complete Complete  Home Care Screening  Complete Complete  Medication Review (RN CM)  Complete Complete  Medication Review (RN Care Manager) Complete    PCP or Specialist appointment within 3-5 days of discharge Complete    HRI or Home Care Consult Complete    SW Recovery Care/Counseling Consult Complete    Palliative Care Screening Not Applicable    Skilled Nursing Facility Not Applicable

## 2022-10-11 NOTE — Progress Notes (Signed)
Mobility Specialist - Progress Note   10/11/22 0918  Mobility  Activity Ambulated with assistance in hallway  Level of Assistance Standby assist, set-up cues, supervision of patient - no hands on  Assistive Device Front wheel walker  Distance Ambulated (ft) 200 ft  Range of Motion/Exercises Active  Activity Response Tolerated well  Mobility Referral Yes  $Mobility charge 1 Mobility  Mobility Specialist Start Time (ACUTE ONLY) 0900  Mobility Specialist Stop Time (ACUTE ONLY) 0915  Mobility Specialist Time Calculation (min) (ACUTE ONLY) 15 min   Pt received in bed and agreed to mobility. Had slight pain in knees, returned to chair with all needs met.  Marilynne Halsted Mobility Specialist

## 2022-10-11 NOTE — Plan of Care (Signed)

## 2022-10-11 NOTE — Progress Notes (Signed)
PROGRESS NOTE    [name]  ZOX:[id number] DOB: [birth year] DOA: 09/30/2022 PCP: [name], MD  Subjective: Pt seen and examined. Pt is stable. Pt's son [name] not at bedside.  Pt has completed 10 days of IV meropenem for pseudomonas septicemia from blood cx. Urine cx negative.  Pt states her vaginal bleeding has improved on on Megace 40 mg bid.  Pt to f/u with outpatient GYN on 11-03-2022 with Dr. Briscoe Deutscher as scheduled.  Overall, doing well. Walked 200 feet today.   Hospital Course: HPI: [name] is a 67 y.o. female with medical history significant of chronic diarrhea, nonalcoholic liver cirrhosis, heart murmur, hypertension, cholelithiasis, class III obesity, pulmonary embolism, short gut syndrome, hypothyroidism who presented to the emergency department due to altered mental status.  Per patient's son, she started acting incoherently this morning but is now closer to baseline.  She usually does these mental changes when she has an UTI or hyperammonemia.  She is currently complaining of back pain but denied any other complaints.   100.1 F, pulse 7016, respirations 32, BP 98/59 mmHg O2 sat 95% on room air.  The patient received 2000 mL of LR bolus, 500 mL of normal saline bolus followed by LR at 150 mL/h and ceftriaxone 2 g IVPB.  Subsequently, the maintenance fluids were held due to concerns of pulmonary edema and resolution of hypotension.  Significant Events: Admitted 09/30/2022 for acute metabolic encephalopathy, sepsis from UTI 10-07-2022 pt still having vaginal bleeding over the last 3 days. DVT prophylaxis Lovenox stopped on 10-03-2022. Pt's son [name] requested GYN consult. 10-07-2022 started Megace 40 mg bid for vaginal bleeding.  Significant Labs: Admitting WBC 15.2 Admitting blood cx 09-30-2022 growing ESBL pseudomonas 10-05-2022 HgB 8.3 g/dl 08-20-9145 HgB 7.3 g/dl 09-08-5619 HGB 7.4 d/gl  Significant Imaging Studies:   Antibiotic Therapy: Anti-infectives (From  admission, onward)    Start     Dose/Rate Route Frequency Ordered Stop   10/03/22 1100  meropenem (MERREM) 1 g in sodium chloride 0.9 % 100 mL IVPB        1 g 200 mL/hr over 30 Minutes Intravenous Every 8 hours 10/03/22 1014 10/11/22 1359   10/01/22 1130  ceFEPIme (MAXIPIME) 2 g in sodium chloride 0.9 % 100 mL IVPB  Status:  Discontinued        2 g 200 mL/hr over 30 Minutes Intravenous Every 8 hours 10/01/22 1043 10/03/22 1014   09/30/22 2200  rifaximin (XIFAXAN) tablet 550 mg        550 mg Oral 2 times daily 09/30/22 1805     09/30/22 0830  cefTRIAXone (ROCEPHIN) 2 g in sodium chloride 0.9 % 100 mL IVPB  Status:  Discontinued        2 g 200 mL/hr over 30 Minutes Intravenous Every 24 hours 09/30/22 0823 10/01/22 1043       Procedures:   Consultants: Infectious Disease GYN - for vaginal bleeding    Assessment and Plan: * Severe sepsis (HCC) Admitted for severe sepsis due to UTI. Now resolved.  Septicemia due to Pseudomonas (HCC) - ESBL On IV meropenem. Will need 10 days of IV Abx. Completed on 10-11-2022.  Repeat blood cx on 10-04-2022 are negative  Called pt's son [name] today. Pt ready for DC in the AM. Pt's son works for food delivery company. He is working today. Will schedule discharge for early AM tomorrow. Have sent Rx to local pharmacy today.  Acute metabolic encephalopathy Due to severe sepsis and UTI. Resolved. Pt is AxOx4  Morbid obesity (HCC) BMI 39.4  Short gut syndrome Has chronic diarrhea.  Hyperammonemia (HCC) Stable. Resolved  Hypotension Due to severe sepsis and UTI. Resolved with IVF.  Hypothyroidism Stable. On synthroid 150 mcg.  Thrombocytopenia (HCC) Resolved. Due to sepsis.  GERD (gastroesophageal reflux disease) Stable. On PPI.  Iron deficiency anemia Stable.  Cirrhosis due to NASH Stable.  Hypomagnesemia Resolved with magnesium replacement.  Vaginal bleeding Pt with hx of uterine fibroids. Last dose of lovenox on 10-03-2022.   GYN consulted on 10-08-2022.  Appreciate GYN consult and recommendations. Pt will remain on Megace 40 mgm bid. And f/u with outpatient GYN.   HgB down to 7.4 on 10-09-2022. stable. Was 8.3 g/dl on 04-18-8117.  DNR (do not resuscitate) Pt is DNR/DNI.  Hypokalemia Repleted with po kcl. Resolved.   DVT prophylaxis: Place and maintain sequential compression device Start: 10/04/22 1234 SCDs Start: 09/30/22 1241    Code Status: Do not attempt resuscitation (DNR) PRE-ARREST INTERVENTIONS DESIRED Family Communication: called pt's son [name] via phone. He will pick pt up tomorrow morning Disposition Plan: return home Reason for continuing need for hospitalization: stable for discharge.  Objective: Vitals:   10/10/22 0458 10/10/22 1302 10/10/22 1952 10/11/22 0454  BP:  (!) 104/58 (!) 101/58 (!) 116/59  Pulse:  80 82 89  Resp:   17 18  Temp:  98.4 F (36.9 C) 98.6 F (37 C) 98.8 F (37.1 C)  TempSrc:      SpO2:  100% 99% 98%  Weight: 100.7 kg     Height:        Intake/Output Summary (Last 24 hours) at 10/11/2022 1101 Last data filed at 10/11/2022 0600 Gross per 24 hour  Intake 200 ml  Output --  Net 200 ml   Filed Weights   10/08/22 0132 10/09/22 0111 10/10/22 0458  Weight: 99.5 kg 99.7 kg 100.7 kg    Examination:  Physical Exam Vitals and nursing note reviewed.  Constitutional:      General: She is not in acute distress.    Appearance: She is obese. She is not toxic-appearing or diaphoretic.     Comments: Lying in bed. Pizza box at her bedside.  HENT:     Head: Normocephalic and atraumatic.     Nose: Nose normal.  Eyes:     General: No scleral icterus.    Comments: Cloudy left corneal  Cardiovascular:     Rate and Rhythm: Normal rate and regular rhythm.  Pulmonary:     Effort: Pulmonary effort is normal.     Breath sounds: Normal breath sounds.  Abdominal:     General: Abdomen is protuberant. Bowel sounds are normal. There is no distension.  Skin:    General:  Skin is warm and dry.     Capillary Refill: Capillary refill takes less than 2 seconds.  Neurological:     Mental Status: She is alert and oriented to person, place, and time.     Data Reviewed: I have personally reviewed following labs and imaging studies  CBC: Recent Labs  Lab 10/05/22 0609 10/07/22 0518 10/08/22 0717 10/09/22 0709  WBC 6.2 5.4 5.5 4.7  NEUTROABS  --  2.7 2.7 2.3  HGB 8.3* 7.4* 7.3* 7.4*  HCT 28.2* 24.3* 23.6* 23.9*  MCV 102.9* 96.4 96.3 95.6  PLT 139* 126* 126* 141*   Basic Metabolic Panel: Recent Labs  Lab 10/05/22 0609 10/06/22 0845 10/08/22 0717 10/09/22 0709  NA 135 137 136 136  K 3.7 3.3* 3.1* 4.0  CL 110  111 108 110  CO2 18* 20* 24 22  GLUCOSE 100* 100* 96 108*  BUN 23 22 23 23   CREATININE 0.88 0.86 0.91 0.84  CALCIUM 7.8* 7.8* 7.7* 7.7*  MG 1.8 1.6* 1.5* 1.8  PHOS 2.9  --   --   --    GFR: Estimated Creatinine Clearance: 74.1 mL/min (by C-G formula based on SCr of 0.84 mg/dL). Liver Function Tests: Recent Labs  Lab 10/05/22 0609 10/06/22 0845 10/08/22 0717 10/09/22 0709  AST  --  53* 66* 62*  ALT  --  33 42 40  ALKPHOS  --  106 99 101  BILITOT  --  1.3* 1.2 1.4*  PROT  --  5.2* 5.2* 5.1*  ALBUMIN 2.1* 2.0* 2.0* 1.9*    Recent Results (from the past 240 hour(s))  Culture, blood (Routine X 2) w Reflex to ID Panel     Status: None   Collection Time: 10/04/22 10:19 AM   Specimen: BLOOD  Result Value Ref Range Status   Specimen Description   Final    BLOOD SITE NOT SPECIFIED Performed at [facility], [address], [place], [place] [address]    Special Requests   Final    BOTTLES DRAWN AEROBIC AND ANAEROBIC Blood Culture adequate volume Performed at [facility], [address], [place], [place] [address]    Culture   Final    NO GROWTH 5 DAYS Performed at [facility], [address], [place], [place] [address]    Report Status 10/09/2022 FINAL  Final  Culture, blood  (Routine X 2) w Reflex to ID Panel     Status: None   Collection Time: 10/04/22 10:19 AM   Specimen: BLOOD RIGHT HAND  Result Value Ref Range Status   Specimen Description   Final    BLOOD RIGHT HAND Performed at [facility], [address] [place], [place] [address]    Special Requests   Final    BOTTLES DRAWN AEROBIC ONLY Blood Culture adequate volume Performed at [facility], [address] [place], [place] [address]    Culture   Final    NO GROWTH 5 DAYS Performed at [facility], [address] [place], [place] [address]    Report Status 10/09/2022 FINAL  Final     Radiology Studies: No results found.  Scheduled Meds:  atorvastatin  40 mg Oral q AM   cyanocobalamin  2,000 mcg Oral Q Fri   folic acid  1 mg Oral Daily   furosemide  40 mg Oral BID   lactulose  30 g Oral BID   levothyroxine  150 mcg Oral QAC breakfast   magnesium gluconate  500 mg Oral BID   megestrol  40 mg Oral BID   midodrine  10 mg Oral TID WC   nystatin cream   Topical BID   pantoprazole  40 mg Oral BID AC   rifaximin  550 mg Oral BID   rOPINIRole  0.25 mg Oral QHS   spironolactone  25 mg Oral Daily   tamsulosin  0.4 mg Oral Daily   venlafaxine XR  75 mg Oral Q breakfast   Continuous Infusions:  sodium chloride Stopped (10/07/22 0026)     LOS: 11 days   Time spent: 35 minutes  Carollee Herter, DO  [facility]  10/11/2022, 11:01 AM

## 2022-10-12 ENCOUNTER — Other Ambulatory Visit (HOSPITAL_COMMUNITY): Payer: Self-pay

## 2022-10-12 ENCOUNTER — Telehealth (HOSPITAL_COMMUNITY): Payer: Self-pay | Admitting: Pharmacy Technician

## 2022-10-12 DIAGNOSIS — A419 Sepsis, unspecified organism: Secondary | ICD-10-CM | POA: Diagnosis not present

## 2022-10-12 DIAGNOSIS — R6520 Severe sepsis without septic shock: Secondary | ICD-10-CM | POA: Diagnosis not present

## 2022-10-12 LAB — CBC
HCT: 23.6 % — ABNORMAL LOW (ref 36.0–46.0)
Hemoglobin: 7.4 g/dL — ABNORMAL LOW (ref 12.0–15.0)
MCH: 29.6 pg (ref 26.0–34.0)
MCHC: 31.4 g/dL (ref 30.0–36.0)
MCV: 94.4 fL (ref 80.0–100.0)
Platelets: 137 10*3/uL — ABNORMAL LOW (ref 150–400)
RBC: 2.5 MIL/uL — ABNORMAL LOW (ref 3.87–5.11)
RDW: 15.6 % — ABNORMAL HIGH (ref 11.5–15.5)
WBC: 5.6 10*3/uL (ref 4.0–10.5)
nRBC: 0 % (ref 0.0–0.2)

## 2022-10-12 LAB — BASIC METABOLIC PANEL
Anion gap: 8 (ref 5–15)
BUN: 26 mg/dL — ABNORMAL HIGH (ref 8–23)
CO2: 23 mmol/L (ref 22–32)
Calcium: 8 mg/dL — ABNORMAL LOW (ref 8.9–10.3)
Chloride: 105 mmol/L (ref 98–111)
Creatinine, Ser: 0.89 mg/dL (ref 0.44–1.00)
GFR, Estimated: >60 mL/min (ref >60)
Glucose, Bld: 116 mg/dL — ABNORMAL HIGH (ref 70–99)
Potassium: 4 mmol/L (ref 3.5–5.1)
Sodium: 136 mmol/L (ref 135–145)

## 2022-10-12 NOTE — Progress Notes (Signed)
Mobility Specialist - Progress Note   10/12/22 1048  Mobility  Activity Ambulated with assistance in room  Level of Assistance Standby assist, set-up cues, supervision of patient - no hands on  Assistive Device Front wheel walker  Distance Ambulated (ft) 20 ft  Range of Motion/Exercises Active  Activity Response Tolerated well  Mobility Referral Yes  $Mobility charge 1 Mobility  Mobility Specialist Start Time (ACUTE ONLY) 1037  Mobility Specialist Stop Time (ACUTE ONLY) 1046  Mobility Specialist Time Calculation (min) (ACUTE ONLY) 9 min   Pt received in bed and agreed to mobility. When entering the hallway, pt had c/o dizziness, did not subsided so we returned to bed with all needs met.  Marilynne Halsted Mobility Specialist

## 2022-10-12 NOTE — Care Management Important Message (Signed)
Important Message  Patient Details IM Letter given. Name: [name] MRN: [id number] Date of Birth: Jul 04, 1955   Important Message Given:  Yes - Medicare IM     [name] 10/12/2022, 8:41 AM

## 2022-10-12 NOTE — Telephone Encounter (Signed)
Pharmacy Patient Advocate Encounter  Received notification from [employer] that Prior Authorization for Megestrol Acetate 40MG  tablets has been APPROVED from 10/11/2022 to 10/11/2023. Ran test claim, Copay is $0.00. This test claim was processed through [facility]- copay amounts may vary at other pharmacies due to pharmacy/plan contracts, or as the patient moves through the different stages of their insurance plan.   PA #/Case ID/Reference #: [id number] Key: [id number]

## 2022-10-13 ENCOUNTER — Inpatient Hospital Stay: Payer: Medicare (Managed Care) | Admitting: Family

## 2022-10-17 ENCOUNTER — Emergency Department (HOSPITAL_COMMUNITY): Payer: Medicare (Managed Care)

## 2022-10-17 ENCOUNTER — Other Ambulatory Visit: Payer: Self-pay

## 2022-10-17 ENCOUNTER — Encounter (HOSPITAL_COMMUNITY): Payer: Self-pay

## 2022-10-17 ENCOUNTER — Inpatient Hospital Stay: Payer: Medicare (Managed Care) | Admitting: Family

## 2022-10-17 ENCOUNTER — Inpatient Hospital Stay (HOSPITAL_COMMUNITY)
Admission: EM | Admit: 2022-10-17 | Discharge: 2022-10-31 | DRG: 981 | Disposition: A | Discharge status: Home Health | Payer: Medicare (Managed Care) | Attending: Internal Medicine | Admitting: Internal Medicine

## 2022-10-17 ENCOUNTER — Inpatient Hospital Stay (HOSPITAL_COMMUNITY): Payer: Medicare (Managed Care)

## 2022-10-17 DIAGNOSIS — I480 Paroxysmal atrial fibrillation: Secondary | ICD-10-CM | POA: Diagnosis present

## 2022-10-17 DIAGNOSIS — K7030 Alcoholic cirrhosis of liver without ascites: Secondary | ICD-10-CM | POA: Diagnosis not present

## 2022-10-17 DIAGNOSIS — Z91041 Radiographic dye allergy status: Secondary | ICD-10-CM

## 2022-10-17 DIAGNOSIS — Z23 Encounter for immunization: Secondary | ICD-10-CM

## 2022-10-17 DIAGNOSIS — Z87891 Personal history of nicotine dependence: Secondary | ICD-10-CM

## 2022-10-17 DIAGNOSIS — K7460 Unspecified cirrhosis of liver: Secondary | ICD-10-CM | POA: Diagnosis present

## 2022-10-17 DIAGNOSIS — E66812 Obesity, class 2: Secondary | ICD-10-CM | POA: Diagnosis present

## 2022-10-17 DIAGNOSIS — L03211 Cellulitis of face: Secondary | ICD-10-CM | POA: Diagnosis present

## 2022-10-17 DIAGNOSIS — Z888 Allergy status to other drugs, medicaments and biological substances status: Secondary | ICD-10-CM

## 2022-10-17 DIAGNOSIS — G8929 Other chronic pain: Secondary | ICD-10-CM | POA: Diagnosis present

## 2022-10-17 DIAGNOSIS — E039 Hypothyroidism, unspecified: Secondary | ICD-10-CM | POA: Diagnosis present

## 2022-10-17 DIAGNOSIS — E785 Hyperlipidemia, unspecified: Secondary | ICD-10-CM | POA: Diagnosis present

## 2022-10-17 DIAGNOSIS — N939 Abnormal uterine and vaginal bleeding, unspecified: Secondary | ICD-10-CM | POA: Diagnosis present

## 2022-10-17 DIAGNOSIS — K3189 Other diseases of stomach and duodenum: Secondary | ICD-10-CM | POA: Diagnosis not present

## 2022-10-17 DIAGNOSIS — R4182 Altered mental status, unspecified: Secondary | ICD-10-CM

## 2022-10-17 DIAGNOSIS — Z79899 Other long term (current) drug therapy: Secondary | ICD-10-CM

## 2022-10-17 DIAGNOSIS — Z6841 Body Mass Index (BMI) 40.0 and over, adult: Secondary | ICD-10-CM | POA: Diagnosis not present

## 2022-10-17 DIAGNOSIS — Z86711 Personal history of pulmonary embolism: Secondary | ICD-10-CM

## 2022-10-17 DIAGNOSIS — M8008XA Age-related osteoporosis with current pathological fracture, vertebra(e), initial encounter for fracture: Secondary | ICD-10-CM | POA: Diagnosis present

## 2022-10-17 DIAGNOSIS — I10 Essential (primary) hypertension: Secondary | ICD-10-CM | POA: Diagnosis present

## 2022-10-17 DIAGNOSIS — Z9049 Acquired absence of other specified parts of digestive tract: Secondary | ICD-10-CM

## 2022-10-17 DIAGNOSIS — K766 Portal hypertension: Secondary | ICD-10-CM | POA: Diagnosis present

## 2022-10-17 DIAGNOSIS — D638 Anemia in other chronic diseases classified elsewhere: Secondary | ICD-10-CM | POA: Diagnosis present

## 2022-10-17 DIAGNOSIS — K7682 Hepatic encephalopathy: Secondary | ICD-10-CM | POA: Diagnosis present

## 2022-10-17 DIAGNOSIS — I060 Rheumatic aortic stenosis: Secondary | ICD-10-CM | POA: Diagnosis present

## 2022-10-17 DIAGNOSIS — I9589 Other hypotension: Secondary | ICD-10-CM | POA: Diagnosis present

## 2022-10-17 DIAGNOSIS — H5462 Unqualified visual loss, left eye, normal vision right eye: Secondary | ICD-10-CM | POA: Diagnosis present

## 2022-10-17 DIAGNOSIS — Z79890 Hormone replacement therapy (postmenopausal): Secondary | ICD-10-CM

## 2022-10-17 DIAGNOSIS — D508 Other iron deficiency anemias: Secondary | ICD-10-CM | POA: Diagnosis not present

## 2022-10-17 DIAGNOSIS — K700 Alcoholic fatty liver: Secondary | ICD-10-CM | POA: Diagnosis present

## 2022-10-17 DIAGNOSIS — Z87442 Personal history of urinary calculi: Secondary | ICD-10-CM

## 2022-10-17 DIAGNOSIS — E669 Obesity, unspecified: Secondary | ICD-10-CM | POA: Diagnosis present

## 2022-10-17 DIAGNOSIS — G9341 Metabolic encephalopathy: Secondary | ICD-10-CM | POA: Diagnosis present

## 2022-10-17 DIAGNOSIS — Z880 Allergy status to penicillin: Secondary | ICD-10-CM

## 2022-10-17 DIAGNOSIS — D509 Iron deficiency anemia, unspecified: Secondary | ICD-10-CM | POA: Diagnosis present

## 2022-10-17 DIAGNOSIS — Z881 Allergy status to other antibiotic agents status: Secondary | ICD-10-CM

## 2022-10-17 DIAGNOSIS — D696 Thrombocytopenia, unspecified: Secondary | ICD-10-CM | POA: Diagnosis present

## 2022-10-17 DIAGNOSIS — N179 Acute kidney failure, unspecified: Secondary | ICD-10-CM | POA: Diagnosis present

## 2022-10-17 DIAGNOSIS — K7581 Nonalcoholic steatohepatitis (NASH): Secondary | ICD-10-CM | POA: Diagnosis present

## 2022-10-17 DIAGNOSIS — D500 Iron deficiency anemia secondary to blood loss (chronic): Secondary | ICD-10-CM | POA: Diagnosis not present

## 2022-10-17 DIAGNOSIS — E66813 Obesity, class 3: Secondary | ICD-10-CM | POA: Diagnosis present

## 2022-10-17 DIAGNOSIS — Z66 Do not resuscitate: Secondary | ICD-10-CM | POA: Diagnosis present

## 2022-10-17 LAB — URINALYSIS, ROUTINE W REFLEX MICROSCOPIC
Bilirubin Urine: NEGATIVE
Glucose, UA: NEGATIVE mg/dL
Ketones, ur: NEGATIVE mg/dL
Nitrite: NEGATIVE
Protein, ur: 30 mg/dL — AB
Specific Gravity, Urine: 1.014 (ref 1.005–1.030)
pH: 6 (ref 5.0–8.0)

## 2022-10-17 LAB — BLOOD GAS, VENOUS
Acid-base deficit: 1.4 mmol/L (ref 0.0–2.0)
Bicarbonate: 22.8 mmol/L (ref 20.0–28.0)
O2 Saturation: 60.1 %
Patient temperature: 37
pCO2, Ven: 36 mm[Hg] — ABNORMAL LOW (ref 44–60)
pH, Ven: 7.41 (ref 7.25–7.43)
pO2, Ven: 37 mm[Hg] (ref 32–45)

## 2022-10-17 LAB — CBC
HCT: 27.3 % — ABNORMAL LOW (ref 36.0–46.0)
Hemoglobin: 8.4 g/dL — ABNORMAL LOW (ref 12.0–15.0)
MCH: 29.5 pg (ref 26.0–34.0)
MCHC: 30.8 g/dL (ref 30.0–36.0)
MCV: 95.8 fL (ref 80.0–100.0)
Platelets: 161 10*3/uL (ref 150–400)
RBC: 2.85 MIL/uL — ABNORMAL LOW (ref 3.87–5.11)
RDW: 15.7 % — ABNORMAL HIGH (ref 11.5–15.5)
WBC: 5.5 10*3/uL (ref 4.0–10.5)
nRBC: 0 % (ref 0.0–0.2)

## 2022-10-17 LAB — AMMONIA: Ammonia: 81 umol/L — ABNORMAL HIGH (ref 9–35)

## 2022-10-17 LAB — TSH: TSH: 4.539 u[IU]/mL — ABNORMAL HIGH (ref 0.350–4.500)

## 2022-10-17 LAB — COMPREHENSIVE METABOLIC PANEL
ALT: 28 U/L (ref 0–44)
AST: 35 U/L (ref 15–41)
Albumin: 2.4 g/dL — ABNORMAL LOW (ref 3.5–5.0)
Alkaline Phosphatase: 108 U/L (ref 38–126)
Anion gap: 7 (ref 5–15)
BUN: 24 mg/dL — ABNORMAL HIGH (ref 8–23)
CO2: 23 mmol/L (ref 22–32)
Calcium: 8.2 mg/dL — ABNORMAL LOW (ref 8.9–10.3)
Chloride: 110 mmol/L (ref 98–111)
Creatinine, Ser: 1.11 mg/dL — ABNORMAL HIGH (ref 0.44–1.00)
GFR, Estimated: 55 mL/min — ABNORMAL LOW (ref >60)
Glucose, Bld: 104 mg/dL — ABNORMAL HIGH (ref 70–99)
Potassium: 3.7 mmol/L (ref 3.5–5.1)
Sodium: 140 mmol/L (ref 135–145)
Total Bilirubin: 1.9 mg/dL — ABNORMAL HIGH (ref 0.3–1.2)
Total Protein: 5.9 g/dL — ABNORMAL LOW (ref 6.5–8.1)

## 2022-10-17 LAB — CBG MONITORING, ED: Glucose-Capillary: 99 mg/dL (ref 70–99)

## 2022-10-17 LAB — PROTIME-INR
INR: 1.6 — ABNORMAL HIGH (ref 0.8–1.2)
Prothrombin Time: 19.2 s — ABNORMAL HIGH (ref 11.4–15.2)

## 2022-10-17 LAB — T4, FREE: Free T4: 1.49 ng/dL — ABNORMAL HIGH (ref 0.61–1.12)

## 2022-10-17 MED ORDER — LEVOTHYROXINE SODIUM 50 MCG PO TABS
150.0000 ug | ORAL_TABLET | Freq: Every day | ORAL | Status: DC
  Administered 2022-10-20 – 2022-10-31 (×12): 150 ug via ORAL
  Filled 2022-10-17 (×14): qty 1

## 2022-10-17 MED ORDER — ENOXAPARIN SODIUM 40 MG/0.4ML IJ SOSY: 40.0000 mg | PREFILLED_SYRINGE | INTRAMUSCULAR | Status: DC

## 2022-10-17 MED ORDER — LACTULOSE 10 GM/15ML PO SOLN
20.0000 g | Freq: Three times a day (TID) | ORAL | Status: DC
  Administered 2022-10-18 – 2022-10-28 (×27): 20 g via ORAL
  Filled 2022-10-17 (×30): qty 30

## 2022-10-17 MED ORDER — VENLAFAXINE HCL ER 75 MG PO CP24
75.0000 mg | ORAL_CAPSULE | Freq: Every day | ORAL | Status: DC
  Administered 2022-10-18 – 2022-10-30 (×13): 75 mg via ORAL
  Filled 2022-10-17 (×14): qty 1

## 2022-10-17 MED ORDER — MEGESTROL ACETATE 40 MG PO TABS
40.0000 mg | ORAL_TABLET | Freq: Two times a day (BID) | ORAL | Status: DC
  Administered 2022-10-18 – 2022-10-30 (×25): 40 mg via ORAL
  Filled 2022-10-17 (×25): qty 1

## 2022-10-17 MED ORDER — RIFAXIMIN 550 MG PO TABS
550.0000 mg | ORAL_TABLET | Freq: Two times a day (BID) | ORAL | Status: DC
  Administered 2022-10-18 – 2022-10-30 (×25): 550 mg via ORAL
  Filled 2022-10-17 (×26): qty 1

## 2022-10-17 MED ORDER — TAMSULOSIN HCL 0.4 MG PO CAPS
0.4000 mg | ORAL_CAPSULE | Freq: Every day | ORAL | Status: DC
  Administered 2022-10-18 – 2022-10-30 (×13): 0.4 mg via ORAL
  Filled 2022-10-17 (×13): qty 1

## 2022-10-17 MED ORDER — HYDROXYZINE HCL 25 MG PO TABS: 25.0000 mg | ORAL_TABLET | Freq: Three times a day (TID) | ORAL | Status: DC | PRN

## 2022-10-17 MED ORDER — MIDODRINE HCL 5 MG PO TABS
10.0000 mg | ORAL_TABLET | Freq: Three times a day (TID) | ORAL | Status: DC
  Administered 2022-10-18 – 2022-10-30 (×35): 10 mg via ORAL
  Filled 2022-10-17 (×37): qty 2

## 2022-10-17 MED ORDER — LACTULOSE 10 GM/15ML PO SOLN
30.0000 g | Freq: Once | ORAL | Status: AC
  Administered 2022-10-17: 30 g via ORAL
  Filled 2022-10-17: qty 60

## 2022-10-17 MED ORDER — PANTOPRAZOLE SODIUM 40 MG PO TBEC: 40.0000 mg | DELAYED_RELEASE_TABLET | Freq: Two times a day (BID) | ORAL | Status: DC

## 2022-10-17 MED ORDER — DICYCLOMINE HCL 10 MG PO CAPS: 10.0000 mg | ORAL_CAPSULE | Freq: Three times a day (TID) | ORAL | Status: DC | PRN

## 2022-10-17 NOTE — H&P (Signed)
History and Physical    [name] ONG:[id number] DOB: 03-Sep-1955 DOA: 10/17/2022  PCP: Kara Hoit, MD [name] is a 67 y.o. female with medical history significant of known alcoholic fatty liver disease, chronic diarrhea,(takes lactulose only if she does not move bm 3 to 4 times daily)  hypertension, obesity, PE, short gut syndrome, hypothyroidism, cholelithiasis, admitted with increasing confusion.  Patient lives at home with her son.  According to son she has been acting confused  Son noticed that she is more weak and confused than her baseline she had difficulty getting dressed and getting out of the house. Patient was recently admitted to the hospital 09/30/2022 discharged on 10/12/2022. Recent hospital admission she had sepsis secondary to Pseudomonas UTI and bacteremia.  Blood culture grew ESBL Pseudomonas treated with meropenem completed on 10/11/2022.  Follow-up blood cultures were negative.  She had vaginal bleeding she was started on Megace she was due to follow-up with gynecologist later this month.  Son tells me she was also is due to see infectious disease on 10/17/2022 but has not seen ended up in the hospital. Denies having sleep apnea.  Denies urinary complaints. Son reported she bites her tongue at times no history of seizures or incontinence reported ED Course: TSH 4.5 normal white count ammonia level 81 vital signs stable ABG unremarkable, CT head no acute intracranial abnormality.  Hypodensity through the right cerebellum artifact likely.  Review of Systems: See above  Ambulatory Status: Ambulates very little at home  Past Medical History:  Diagnosis Date   Chronic diarrhea    Cirrhosis, 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   pt stated on admission history review   Heart murmur    Hypertension    Kidney stone    Obesity    Pulmonary embolism (HCC)    Short gut syndrome    Thyroid disease     Past  Surgical History:  Procedure Laterality Date   CESAREAN SECTION WITH BILATERAL TUBAL LIGATION     CHOLECYSTECTOMY     COLON SURGERY     HERNIA REPAIR     ILEOSTOMY     ILEOSTOMY CLOSURE     IR KYPHO THORACIC WITH BONE BIOPSY  08/31/2022   IR RADIOLOGIST EVAL & MGMT  09/27/2022   KIDNEY STONE SURGERY     WRIST SURGERY      Social History   Socioeconomic History   Marital status: Single    Spouse name: Not on file   Number of children: 1   Years of education: Not on file   Highest education level: Not on file  Occupational History   Not on file  Tobacco Use   Smoking status: Former    Current packs/day: 0.00    Types: Cigarettes    Quit date: 62    Years since quitting: 37.7    Passive exposure: Past   Smokeless tobacco: Never  Vaping Use   Vaping status: Never Used  Substance and Sexual Activity   Alcohol use: No   Drug use: No   Sexual activity: Not Currently    Birth control/protection: Post-menopausal  Other Topics Concern   Not on file  Social History Narrative   Not on file   Social Determinants of Health   Financial Resource Strain: Not on file  Food Insecurity: No Food Insecurity (09/30/2022)   Hunger Vital Sign    Worried About Running Out of Food in the Last Year: Never true  Ran Out of Food in the Last Year: Never true  Transportation Needs: No Transportation Needs (09/30/2022)   PRAPARE - Administrator, Civil Service (Medical): No    Lack of Transportation (Non-Medical): No  Physical Activity: Not on file  Stress: Not on file  Social Connections: Not on file  Intimate Partner Violence: Not At Risk (09/30/2022)   Humiliation, Afraid, Rape, and Kick questionnaire    Fear of Current or Ex-Partner: No    Emotionally Abused: No    Physically Abused: No    Sexually Abused: No    Allergies  Allergen Reactions   Ace Inhibitors Anaphylaxis and Swelling   Ivp Dye [Iodinated Contrast Media] Anaphylaxis   Desitin [Zinc Oxide] Other (See  Comments)    Worsens rash   Chlorhexidine Rash and Other (See Comments)    WORSENS RASHES   Doxycycline Rash   Penicillins Rash   Zofran [Ondansetron] Rash    History reviewed. No pertinent family history.    Prior to Admission medications   Medication Sig Start Date End Date Taking? Authorizing Provider  acetaminophen (TYLENOL) 325 MG tablet Take 650 mg by mouth every 6 (six) hours as needed for moderate pain or headache.    [provider]  atorvastatin (LIPITOR) 40 MG tablet Take 40 mg by mouth in the morning. 01/19/22   [provider]  ciclopirox (PENLAC) 8 % solution Apply 1 application  topically See admin instructions. Apply over all toenails and surrounding skin at bedtime. Apply daily over previous coat. After seven (7) days, may remove with alcohol and continue cycle.    [provider]  colchicine 0.6 MG tablet Take 0.6 mg by mouth daily as needed (gout).    [provider]  cyanocobalamin (VITAMIN B12) 1000 MCG tablet Take 2,000 mcg by mouth every Friday.    [provider]  dicyclomine (BENTYL) 10 MG capsule Take 1 capsule (10 mg total) by mouth 3 (three) times daily as needed for spasms (abdominal cramping). 09/09/22   Lewie Chamber, MD  EPINEPHrine 0.3 mg/0.3 mL IJ SOAJ injection Inject 0.3 mg into the muscle as needed for anaphylaxis. 08/07/19   [provider]  folic acid (FOLVITE) 1 MG tablet Take 1 tablet (1 mg total) by mouth daily. 06/16/22   Calvert Cantor, MD  furosemide (LASIX) 40 MG tablet Take 1 tablet (40 mg total) by mouth 2 (two) times daily. 09/09/22   Lewie Chamber, MD  HYDROcodone-acetaminophen (NORCO) 10-325 MG tablet Take 1 tablet by mouth every 6 (six) hours as needed for moderate pain or severe pain. 02/25/22   [provider]  hydrOXYzine (ATARAX/VISTARIL) 25 MG tablet Take 25 mg by mouth 3 (three) times daily as needed for itching.    [provider]  IRON-VITAMIN C PO Take 1 tablet by  mouth daily with breakfast.    [provider]  lactulose (CHRONULAC) 10 GM/15ML solution Take 30 mLs (20 g total) by mouth 3 (three) times daily. 09/09/22   Lewie Chamber, MD  levothyroxine (SYNTHROID) 150 MCG tablet Take 150 mcg by mouth daily before breakfast. 10/26/21   [provider]  magnesium gluconate (MAGONATE) 500 MG tablet Take 1,000 mg by mouth daily.    [provider]  megestrol (MEGACE) 40 MG tablet Take 1 tablet (40 mg total) by mouth 2 (two) times daily. 10/11/22 11/10/22  Carollee Herter, DO  methocarbamol (ROBAXIN) 500 MG tablet Take 500 mg by mouth in the morning. 03/04/22   [provider]  midodrine (PROAMATINE) 10 MG tablet Take 1 tablet (10 mg total) by mouth 3 (three) times daily with meals. 09/09/22   Lewie Chamber, MD  naloxone [facility]) nasal spray 4 mg/0.1 mL Place 1 spray into the nose as needed (accidental overdose). 03/25/22   [provider]  nystatin cream (MYCOSTATIN) Apply topically 2 (two) times daily. Patient taking differently: Apply 1 Application topically 2 (two) times daily as needed for dry skin (or irritation). 05/22/22   Leatha Gilding, MD  nystatin powder Apply 1 Application topically 3 (three) times daily as needed (for irritation- affected areas).    [provider]  pantoprazole (PROTONIX) 40 MG tablet Take 1 tablet (40 mg total) by mouth 2 (two) times daily before a meal. 09/09/22   Lewie Chamber, MD  potassium chloride (KLOR-CON) 10 MEQ tablet Take 20 mEq by mouth in the morning and at bedtime.    [provider]  promethazine (PHENERGAN) 12.5 MG tablet Take 12.5 mg by mouth every 6 (six) hours as needed for nausea or vomiting.    [provider]  rifaximin (XIFAXAN) 550 MG TABS tablet Take 550 mg by mouth 2 (two) times daily.    [provider]  rOPINIRole (REQUIP) 0.25 MG tablet Take 1 tablet (0.25 mg total) by mouth at bedtime. 09/09/22   Lewie Chamber, MD  spironolactone  (ALDACTONE) 25 MG tablet Take 1 tablet (25 mg total) by mouth daily. 09/10/22   Lewie Chamber, MD  tamsulosin (FLOMAX) 0.4 MG CAPS capsule Take 1 capsule (0.4 mg total) by mouth daily. 09/10/22   Lewie Chamber, MD  triamcinolone cream (KENALOG) 0.5 % Apply to affected area of right breast twice daily as directed. Patient not taking: Reported on 09/30/2022 05/10/22   Osvaldo Shipper, MD  venlafaxine XR (EFFEXOR-XR) 75 MG 24 hr capsule Take 75 mg by mouth daily with breakfast. 08/17/21   [provider]  Vitamin D, Ergocalciferol, (DRISDOL) 50000 UNITS CAPS Take 50,000 Units by mouth every Wednesday.    [provider]    Physical Exam: Vitals:   10/17/22 1330 10/17/22 1345 10/17/22 1400 10/17/22 1703  BP: (!) 153/75 128/73 (!) 125/56 126/66  Pulse:    94  Resp:    18  Temp:    97.9 F (36.6 C)  TempSrc:    Oral  SpO2:    100%  Weight:      Height:         General: Appears chronically ill-appearing snoring ENT:  grossly normal hearing, lips & tongue, oral mucosa dry  neck:  no LAD, masses or thyromegaly Cardiovascular:  RRR, no m/r/g. No LE edema.  Respiratory:  CTA bilaterally, no w/r/r. Normal respiratory effort. Abdomen:  soft, distended generalized tenderness, hernia noted Psychiatric: Drowsy Neurologic: Drowsy not oriented to place person or time Moves all extremity Labs on Admission: I have personally reviewed following labs and imaging studies  CBC: Recent Labs  Lab 10/12/22 0602 10/17/22 1205  WBC 5.6 5.5  HGB 7.4* 8.4*  HCT 23.6* 27.3*  MCV 94.4 95.8  PLT 137* 161   Basic Metabolic Panel: Recent Labs  Lab 10/12/22 0602 10/17/22 1205  NA 136 140  K 4.0 3.7  CL 105 110  CO2 23 23  GLUCOSE 116* 104*  BUN 26* 24*  CREATININE 0.89 1.11*  CALCIUM 8.0* 8.2*   GFR: Estimated Creatinine Clearance: 55.1 mL/min (A) (by C-G formula based on SCr of 1.11 mg/dL (H)). Liver Function Tests: Recent Labs  Lab 10/17/22 1205  AST  35  ALT 28  ALKPHOS  108  BILITOT 1.9*  PROT 5.9*  ALBUMIN 2.4*   No results for input(s): "LIPASE", "AMYLASE" in the last 168 hours. Recent Labs  Lab 10/17/22 1425  AMMONIA 81*   Coagulation Profile: Recent Labs  Lab 10/17/22 1425  INR 1.6*   Cardiac Enzymes: No results for input(s): "CKTOTAL", "CKMB", "CKMBINDEX", "TROPONINI" in the last 168 hours. BNP (last 3 results) No results for input(s): "PROBNP" in the last 8760 hours. HbA1C: No results for input(s): "HGBA1C" in the last 72 hours. CBG: Recent Labs  Lab 10/17/22 1118  GLUCAP 99   Lipid Profile: No results for input(s): "CHOL", "HDL", "LDLCALC", "TRIG", "CHOLHDL", "LDLDIRECT" in the last 72 hours. Thyroid Function Tests: Recent Labs    10/17/22 1425  TSH 4.539*   Anemia Panel: No results for input(s): "VITAMINB12", "FOLATE", "FERRITIN", "TIBC", "IRON", "RETICCTPCT" in the last 72 hours. Urine analysis:    Component Value Date/Time   COLORURINE YELLOW 10/17/2022 1658   APPEARANCEUR HAZY (A) 10/17/2022 1658   LABSPEC 1.014 10/17/2022 1658   PHURINE 6.0 10/17/2022 1658   GLUCOSEU NEGATIVE 10/17/2022 1658   HGBUR LARGE (A) 10/17/2022 1658   BILIRUBINUR NEGATIVE 10/17/2022 1658   KETONESUR NEGATIVE 10/17/2022 1658   PROTEINUR 30 (A) 10/17/2022 1658   UROBILINOGEN 0.2 08/21/2013 0017   NITRITE NEGATIVE 10/17/2022 1658   LEUKOCYTESUR LARGE (A) 10/17/2022 1658    Creatinine Clearance: Estimated Creatinine Clearance: 55.1 mL/min (A) (by C-G formula based on SCr of 1.11 mg/dL (H)).  Sepsis Labs: @LABRCNTIP (procalcitonin:4,lacticidven:4) )No results found for this or any previous visit (from the past 240 hour(s)).   Radiological Exams on Admission: CT Head Wo Contrast  Result Date: 10/17/2022 CLINICAL DATA:  ams EXAM: CT HEAD WITHOUT CONTRAST TECHNIQUE: Contiguous axial images were obtained from the base of the skull through the vertex without intravenous contrast. RADIATION DOSE REDUCTION: This exam was performed according  to the departmental dose-optimization program which includes automated exposure control, adjustment of the mA and/or kV according to patient size and/or use of iterative reconstruction technique. COMPARISON:  CT Head 09/30/22 FINDINGS: Brain: No hemorrhage. No hydrocephalus. No extra-axial fluid collection. No CT evidence of an acute cortical infarct. No mass effect. No mass lesion. There is sequela of mild chronic microvascular ischemic change. Apparent linear hypodensity through the right cerebellum is likely artifactual and related to streak artifact. Vascular: No hyperdense vessel or unexpected calcification. Skull: Normal. Negative for fracture or focal lesion. Sinuses/Orbits: No middle ear or mastoid effusion. Paranasal sinuses are clear. Left lens replacement. Orbits are otherwise unremarkable. Other: None. IMPRESSION: No acute intracranial abnormality. Apparent linear hypodensity through the right cerebellum is likely artifactual and related to streak artifact. If there is clinical concern for acute infarct, consider further evaluation with MRI. Electronically Signed   By: Lorenza Cambridge M.D.   On: 10/17/2022 14:06    Assessment/Plan Principal Problem:   Hepatic encephalopathy (HCC) Active Problems:   Acute metabolic encephalopathy   Cirrhosis due to NASH   Iron deficiency anemia   Morbid obesity (HCC)   #1 acute metabolic encephalopathy secondary to hepatic encephalopathy hyperammonia anemia.  UA appears dirty.  Urine culture blood cultures ordered.  Status post recent ESBL Pseudomonas UTI and bacteremia treated with meropenem. Will hold off on starting antibiotics tonight. Resume lactulose.  Son tells me she was told by the GI doctor that she should not take lactulose on a regular basis because of chronic diarrhea they only give lactulose if she has not had  a BM 3-4 BM per day. She is not febrile tachycardic her white count is normal Her Lasix and Aldactone tonight may restart tomorrow she  appears dry we will hold off on IV fluids  #2 NASH continue rifaximin and lactulose  #3 chronic hypotension continue midodrine  #4 hypothyroidism continue levothyroxine  #5 thrombocytopenia stable on Lovenox monitor platelets  #6 CODE STATUS patient is DNR DNI discussed with son  #7 recent vaginal bleeding has appointment with GYN in 11/03/2022 was started on Megace during her last hospital stay continue  #8 anemia of chronic disease hemoglobin stable though she appears on the dry side  # 9 CT scan with cerebellar infarct could not rule out acute stroke recommending MRI brain  Estimated body mass index is 39.32 kg/m as calculated from the following:   Height as of this encounter: 5\' 2"  (1.575 m).   Weight as of this encounter: 97.5 kg.   DVT prophylaxis: Lovenox Code Status: DNR/DNI Family Communication: Disc  with son Disposition Plan: Inpatient Consults called: None none Admission status: inpatient   Alwyn Ren MD   10/17/2022, 6:27 PM

## 2022-10-17 NOTE — Progress Notes (Signed)
PIV consult: Arrived to ED, RN reports PIV no longer needed. Cancel consult.

## 2022-10-17 NOTE — ED Provider Notes (Signed)
[place] EMERGENCY DEPARTMENT AT [facility] Provider Note   CSN: [id number] Arrival date & time: 10/17/22  1107     History {Add pertinent medical, surgical, social history, OB history to HPI:1} Chief Complaint  Patient presents with   Weakness   Altered Mental Status    [name] is a 67 y.o. female.  67 year old female with a history of nonalcoholic cirrhosis, hepatic encephalopathy, PE not on anticoagulation, and hypothyroidism who presents to the emergency department for altered mental status.  Per son patient was just hospitalized and discharged after she was found to have pseudomonal bloodstream infection.  Was found to have a UTI that admission as well.  Does have a history of hepatic encephalopathy and reports that she has been taking her lactulose that he has been giving to her.  No new medication changes.  This morning at 1 AM the son noticed that she seemed to be more confused than usual.  Had difficulty getting dressed and getting out of the house and has been more somnolent as well.  Patient denies any pain to me at this time.  No alcohol or drug use       Home Medications Prior to Admission medications   Medication Sig Start Date End Date Taking? Authorizing Provider  acetaminophen (TYLENOL) 325 MG tablet Take 650 mg by mouth every 6 (six) hours as needed for moderate pain or headache.    [provider]  atorvastatin (LIPITOR) 40 MG tablet Take 40 mg by mouth in the morning. 01/19/22   [provider]  ciclopirox (PENLAC) 8 % solution Apply 1 application  topically See admin instructions. Apply over all toenails and surrounding skin at bedtime. Apply daily over previous coat. After seven (7) days, may remove with alcohol and continue cycle.    [provider]  colchicine 0.6 MG tablet Take 0.6 mg by mouth daily as needed (gout).    [provider]  cyanocobalamin (VITAMIN B12) 1000 MCG tablet Take 2,000 mcg by mouth  every Friday.    [provider]  dicyclomine (BENTYL) 10 MG capsule Take 1 capsule (10 mg total) by mouth 3 (three) times daily as needed for spasms (abdominal cramping). 09/09/22   Lewie Chamber, MD  EPINEPHrine 0.3 mg/0.3 mL IJ SOAJ injection Inject 0.3 mg into the muscle as needed for anaphylaxis. 08/07/19   [provider]  folic acid (FOLVITE) 1 MG tablet Take 1 tablet (1 mg total) by mouth daily. 06/16/22   Calvert Cantor, MD  furosemide (LASIX) 40 MG tablet Take 1 tablet (40 mg total) by mouth 2 (two) times daily. 09/09/22   Lewie Chamber, MD  HYDROcodone-acetaminophen (NORCO) 10-325 MG tablet Take 1 tablet by mouth every 6 (six) hours as needed for moderate pain or severe pain. 02/25/22   [provider]  hydrOXYzine (ATARAX/VISTARIL) 25 MG tablet Take 25 mg by mouth 3 (three) times daily as needed for itching.    [provider]  IRON-VITAMIN C PO Take 1 tablet by mouth daily with breakfast.    [provider]  lactulose (CHRONULAC) 10 GM/15ML solution Take 30 mLs (20 g total) by mouth 3 (three) times daily. 09/09/22   Lewie Chamber, MD  levothyroxine (SYNTHROID) 150 MCG tablet Take 150 mcg by mouth daily before breakfast. 10/26/21   [provider]  magnesium gluconate (MAGONATE) 500 MG tablet Take 1,000 mg by mouth daily.    [provider]  megestrol (MEGACE) 40 MG tablet Take 1 tablet (40 mg  total) by mouth 2 (two) times daily. 10/11/22 11/10/22  Carollee Herter, DO  methocarbamol (ROBAXIN) 500 MG tablet Take 500 mg by mouth in the morning. 03/04/22   [provider]  midodrine (PROAMATINE) 10 MG tablet Take 1 tablet (10 mg total) by mouth 3 (three) times daily with meals. 09/09/22   Lewie Chamber, MD  naloxone [facility]) nasal spray 4 mg/0.1 mL Place 1 spray into the nose as needed (accidental overdose). 03/25/22   [provider]  nystatin cream (MYCOSTATIN) Apply topically 2 (two) times daily. Patient taking  differently: Apply 1 Application topically 2 (two) times daily as needed for dry skin (or irritation). 05/22/22   Leatha Gilding, MD  nystatin powder Apply 1 Application topically 3 (three) times daily as needed (for irritation- affected areas).    [provider]  pantoprazole (PROTONIX) 40 MG tablet Take 1 tablet (40 mg total) by mouth 2 (two) times daily before a meal. 09/09/22   Lewie Chamber, MD  potassium chloride (KLOR-CON) 10 MEQ tablet Take 20 mEq by mouth in the morning and at bedtime.    [provider]  promethazine (PHENERGAN) 12.5 MG tablet Take 12.5 mg by mouth every 6 (six) hours as needed for nausea or vomiting.    [provider]  rifaximin (XIFAXAN) 550 MG TABS tablet Take 550 mg by mouth 2 (two) times daily.    [provider]  rOPINIRole (REQUIP) 0.25 MG tablet Take 1 tablet (0.25 mg total) by mouth at bedtime. 09/09/22   Lewie Chamber, MD  spironolactone (ALDACTONE) 25 MG tablet Take 1 tablet (25 mg total) by mouth daily. 09/10/22   Lewie Chamber, MD  tamsulosin (FLOMAX) 0.4 MG CAPS capsule Take 1 capsule (0.4 mg total) by mouth daily. 09/10/22   Lewie Chamber, MD  triamcinolone cream (KENALOG) 0.5 % Apply to affected area of right breast twice daily as directed. Patient not taking: Reported on 09/30/2022 05/10/22   Osvaldo Shipper, MD  venlafaxine XR (EFFEXOR-XR) 75 MG 24 hr capsule Take 75 mg by mouth daily with breakfast. 08/17/21   [provider]  Vitamin D, Ergocalciferol, (DRISDOL) 50000 UNITS CAPS Take 50,000 Units by mouth every Wednesday.    [provider]      Allergies    Ace inhibitors, Ivp dye [iodinated contrast media], Desitin [zinc oxide], Chlorhexidine, Doxycycline, Penicillins, and Zofran [ondansetron]    Review of Systems   Review of Systems  Physical Exam Updated Vital Signs BP 127/80 (BP Location: Left Arm)   Pulse 99   Temp 98.6 F (37 C) (Oral)   Resp 19   Ht 5\' 2"  (1.575 m)   Wt 97.5 kg    SpO2 99%   BMI 39.32 kg/m  Physical Exam Vitals and nursing note reviewed.  Constitutional:      General: She is not in acute distress.    Appearance: She is well-developed.     Comments: Drowsy but easily arousable.  Oriented to self and place but did not know the year.  HENT:     Head: Normocephalic and atraumatic.     Right Ear: External ear normal.     Left Ear: External ear normal.     Nose: Nose normal.  Eyes:     Extraocular Movements: Extraocular movements intact.     Conjunctiva/sclera: Conjunctivae normal.     Comments: Left corneal opacification is chronic.  Right pupil 5 mm and reactive.  EOM intact.  Cardiovascular:     Rate and Rhythm: Normal rate  and regular rhythm.     Heart sounds: Murmur heard.  Pulmonary:     Effort: Pulmonary effort is normal. No respiratory distress.     Breath sounds: Normal breath sounds.  Abdominal:     General: Abdomen is flat. There is distension.     Palpations: Abdomen is soft. There is no mass.     Tenderness: There is abdominal tenderness (Mild, diffuse, chronic per patient and her son). There is no guarding.  Musculoskeletal:     Cervical back: Normal range of motion and neck supple.     Right lower leg: Edema (1+) present.     Left lower leg: Edema (1+) present.  Skin:    General: Skin is warm and dry.  Neurological:     Cranial Nerves: No cranial nerve deficit.     Sensory: No sensory deficit.     Motor: No weakness.     Comments: Mild tremor with outstretched hands  Psychiatric:        Mood and Affect: Mood normal.     ED Results / Procedures / Treatments   Labs (all labs ordered are listed, but only abnormal results are displayed) Labs Reviewed  COMPREHENSIVE METABOLIC PANEL  CBC  CBG MONITORING, ED  CBG MONITORING, ED    EKG None  Radiology No results found.  Procedures Procedures  {Document cardiac monitor, telemetry assessment procedure when appropriate:1}  Medications Ordered in ED Medications -  No data to display  ED Course/ Medical Decision Making/ A&P   {   Click here for ABCD2, HEART and other calculatorsREFRESH Note before signing :1}                              Medical Decision Making Amount and/or Complexity of Data Reviewed Labs: ordered. Radiology: ordered.   ***  {Document critical care time when appropriate:1} {Document review of labs and clinical decision tools ie heart score, Chads2Vasc2 etc:1}  {Document your independent review of radiology images, and any outside records:1} {Document your discussion with family members, caretakers, and with consultants:1} {Document social determinants of health affecting pt's care:1} {Document your decision making why or why not admission, treatments were needed:1} Final Clinical Impression(s) / ED Diagnoses Final diagnoses:  None    Rx / DC Orders ED Discharge Orders     None

## 2022-10-17 NOTE — ED Triage Notes (Signed)
Pt arrived with son and he reports she has been increasingly confused since this morning. Hx of elevated ammonia levels and UTI per son. Was admitted and d/c recently and Treated for bacteria infection. States suppose to see ID doc today for F/U. Denies N/V.

## 2022-10-18 DIAGNOSIS — K7682 Hepatic encephalopathy: Secondary | ICD-10-CM | POA: Diagnosis not present

## 2022-10-18 LAB — COMPREHENSIVE METABOLIC PANEL
ALT: 26 U/L (ref 0–44)
AST: 44 U/L — ABNORMAL HIGH (ref 15–41)
Albumin: 2.4 g/dL — ABNORMAL LOW (ref 3.5–5.0)
Alkaline Phosphatase: 103 U/L (ref 38–126)
Anion gap: 13 (ref 5–15)
BUN: 21 mg/dL (ref 8–23)
CO2: 19 mmol/L — ABNORMAL LOW (ref 22–32)
Calcium: 8.4 mg/dL — ABNORMAL LOW (ref 8.9–10.3)
Chloride: 112 mmol/L — ABNORMAL HIGH (ref 98–111)
Creatinine, Ser: 0.73 mg/dL (ref 0.44–1.00)
GFR, Estimated: >60 mL/min (ref >60)
Glucose, Bld: 80 mg/dL (ref 70–99)
Potassium: 3.9 mmol/L (ref 3.5–5.1)
Sodium: 144 mmol/L (ref 135–145)
Total Bilirubin: 2.2 mg/dL — ABNORMAL HIGH (ref 0.3–1.2)
Total Protein: 6 g/dL — ABNORMAL LOW (ref 6.5–8.1)

## 2022-10-18 LAB — CBC WITH DIFFERENTIAL/PLATELET
Abs Immature Granulocytes: 0.01 10*3/uL (ref 0.00–0.07)
Basophils Absolute: 0 10*3/uL (ref 0.0–0.1)
Basophils Relative: 1 %
Eosinophils Absolute: 0.4 10*3/uL (ref 0.0–0.5)
Eosinophils Relative: 8 %
HCT: 24.7 % — ABNORMAL LOW (ref 36.0–46.0)
Hemoglobin: 7.8 g/dL — ABNORMAL LOW (ref 12.0–15.0)
Immature Granulocytes: 0 %
Lymphocytes Relative: 31 %
Lymphs Abs: 1.6 10*3/uL (ref 0.7–4.0)
MCH: 30 pg (ref 26.0–34.0)
MCHC: 31.6 g/dL (ref 30.0–36.0)
MCV: 95 fL (ref 80.0–100.0)
Monocytes Absolute: 0.5 10*3/uL (ref 0.1–1.0)
Monocytes Relative: 9 %
Neutro Abs: 2.7 10*3/uL (ref 1.7–7.7)
Neutrophils Relative %: 51 %
Platelets: 158 10*3/uL (ref 150–400)
RBC: 2.6 MIL/uL — ABNORMAL LOW (ref 3.87–5.11)
RDW: 15.5 % (ref 11.5–15.5)
WBC: 5.3 10*3/uL (ref 4.0–10.5)
nRBC: 0 % (ref 0.0–0.2)

## 2022-10-18 LAB — CBC
HCT: 26.8 % — ABNORMAL LOW (ref 36.0–46.0)
Hemoglobin: 8.3 g/dL — ABNORMAL LOW (ref 12.0–15.0)
MCH: 29.9 pg (ref 26.0–34.0)
MCHC: 31 g/dL (ref 30.0–36.0)
MCV: 96.4 fL (ref 80.0–100.0)
Platelets: 157 10*3/uL (ref 150–400)
RBC: 2.78 MIL/uL — ABNORMAL LOW (ref 3.87–5.11)
RDW: 15.6 % — ABNORMAL HIGH (ref 11.5–15.5)
WBC: 5.5 10*3/uL (ref 4.0–10.5)
nRBC: 0 % (ref 0.0–0.2)

## 2022-10-18 LAB — AMMONIA: Ammonia: 134 umol/L — ABNORMAL HIGH (ref 9–35)

## 2022-10-18 MED ORDER — SODIUM CHLORIDE 0.9 % IV SOLN
1.0000 g | Freq: Once | INTRAVENOUS | Status: AC
  Administered 2022-10-18: 1 g via INTRAVENOUS
  Filled 2022-10-18: qty 10

## 2022-10-18 MED ORDER — LACTULOSE ENEMA
300.0000 mL | Freq: Three times a day (TID) | ORAL | Status: DC
  Filled 2022-10-18 (×2): qty 300

## 2022-10-18 MED ORDER — OCTREOTIDE LOAD VIA INFUSION
50.0000 ug | Freq: Once | INTRAVENOUS | Status: AC
  Administered 2022-10-18: 50 ug via INTRAVENOUS
  Filled 2022-10-18: qty 25

## 2022-10-18 MED ORDER — METOCLOPRAMIDE HCL 5 MG/ML IJ SOLN
10.0000 mg | INTRAMUSCULAR | Status: AC
  Administered 2022-10-18: 10 mg via INTRAVENOUS
  Filled 2022-10-18: qty 2

## 2022-10-18 MED ORDER — SODIUM CHLORIDE 0.9 % IV BOLUS
1000.0000 mL | Freq: Once | INTRAVENOUS | Status: AC
  Administered 2022-10-18: 1000 mL via INTRAVENOUS

## 2022-10-18 MED ORDER — PROCHLORPERAZINE EDISYLATE 10 MG/2ML IJ SOLN
10.0000 mg | Freq: Once | INTRAMUSCULAR | Status: AC
  Administered 2022-10-18: 10 mg via INTRAVENOUS
  Filled 2022-10-18: qty 2

## 2022-10-18 MED ORDER — SODIUM CHLORIDE 0.9 % IV SOLN
50.0000 ug/h | INTRAVENOUS | Status: DC
  Administered 2022-10-18: 50 ug/h via INTRAVENOUS
  Filled 2022-10-18 (×2): qty 1

## 2022-10-18 MED ORDER — PANTOPRAZOLE SODIUM 40 MG IV SOLR
40.0000 mg | Freq: Two times a day (BID) | INTRAVENOUS | Status: DC
  Administered 2022-10-18 (×2): 40 mg via INTRAVENOUS
  Filled 2022-10-18 (×2): qty 10

## 2022-10-18 NOTE — ED Notes (Signed)
It was noticed that pt had blood coming from her mouth. Patient stated she began vomiting. [name] PA-C was notified and immediately came to bedside and initiated treatments. Pt mentation remained the same.

## 2022-10-18 NOTE — ED Notes (Signed)
Admitting at bedside 

## 2022-10-18 NOTE — H&P (View-Only) (Signed)
[facility] Consult  Referring Provider: [facility] hospitalist/Dr. Jerolyn Center Primary Care Physician:  [name], MD Primary Gastroenterologist: [facility]/unassigned  Reason for Consultation: Hematemesis  HPI: [name] is a 67 y.o. female with history of [name] related cirrhosis came to the ER with confusion. In ER she was found to have blood around her mouth and neck and suspected to have hematemesis. Due to history of cirrhosis, variceal bleeding suspected. Patient states she does not recall being brought to the hospital, is not sure if she is recently been vomiting blood or has had any change in the color of the stool.  Patient has had multiple recent hospitalization for fever, confusion, septic shock, cellulitis, UTI and outpatient endoscopy was being planned, last seen by her GI on 08/07/22  Prior GI workup: EGD and colonoscopy 03/2019:normal esophagus, small HH o/w normal stomach (bx neg for H pylori), normal duodenum (bx neg for Celiac), BBPS (9), s/p ileocecectomy with normal neo-TI (5cm), normal colonic mucosa (bx neg for colitis), internal hemorrhoids, and a 7mm sessile TA polyp was removed. Repeat colonoscopy due in 3/28.     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 Authorizing Provider  atorvastatin (LIPITOR) 40 MG  tablet Take 40 mg by mouth in the morning. 01/19/22  Yes [provider]  ciclopirox (PENLAC) 8 % solution Apply 1 application  topically See admin instructions. Apply over all toenails and surrounding skin at bedtime. Apply daily over previous coat. After seven (7) days, may remove with alcohol and continue cycle.   Yes [provider]  colchicine 0.6 MG tablet Take 0.6 mg by mouth daily as needed (gout flares).   Yes [provider]  cyanocobalamin (VITAMIN B12) 1000 MCG tablet Take 2,000 mcg by mouth every Friday.   Yes [provider]  dicyclomine (BENTYL) 10 MG capsule Take 1 capsule (10 mg total) by mouth 3 (three) times daily as needed for spasms (abdominal cramping). 09/09/22  Yes Lewie Chamber, MD  EPINEPHrine 0.3 mg/0.3 mL IJ SOAJ injection Inject 0.3 mg into the muscle as needed for anaphylaxis. 08/07/19  Yes [provider]  folic acid (FOLVITE) 1 MG tablet Take 1 tablet (1 mg total) by mouth daily. 06/16/22  Yes Calvert Cantor, MD  furosemide (LASIX) 40 MG tablet Take 1 tablet (40 mg total) by mouth 2 (two) times daily. 09/09/22  Yes Lewie Chamber, MD  HYDROcodone-acetaminophen (NORCO) 10-325 MG tablet Take 1 tablet by mouth 3 (three) times daily as needed for moderate pain or severe pain. 02/25/22  Yes [provider]  IRON-VITAMIN C PO Take 1 tablet by mouth daily with breakfast.   Yes [provider]  lactulose (CHRONULAC) 10 GM/15ML solution Take 30 mLs (20 g total) by mouth 3 (three) times daily. Patient taking differently: Take 20 g by mouth 3 (three) times daily as needed for mild constipation or moderate constipation. 09/09/22  Yes Lewie Chamber, MD  levothyroxine (SYNTHROID) 150 MCG tablet Take 150 mcg by mouth daily before breakfast. 10/26/21  Yes [provider]  magnesium gluconate (MAGONATE) 500 MG tablet Take 1,000 mg by mouth daily.   Yes [provider]  megestrol (MEGACE) 40 MG tablet Take 1 tablet  (40 mg total) by mouth 2 (two) times daily. 10/11/22 11/10/22 Yes Carollee Herter, DO  methocarbamol (ROBAXIN) 500 MG tablet Take 500 mg by mouth in the morning. 03/04/22  Yes [provider]  midodrine (PROAMATINE) 10 MG tablet Take 1 tablet (10 mg total) by mouth 3 (three) times daily with meals. 09/09/22  Yes Lewie Chamber, MD  naloxone [facility]) nasal spray 4 mg/0.1 mL Place 1 spray into the nose as needed (accidental overdose). 03/25/22  Yes [provider]  nystatin cream (MYCOSTATIN) Apply topically 2 (two) times daily. Patient taking differently: Apply 1 Application topically 2 (two) times daily as needed for dry skin (or irritation). 05/22/22  Yes Leatha Gilding, MD  nystatin powder Apply 1 Application topically 3 (three) times daily as needed (for irritation- affected areas).   Yes [provider]  pantoprazole (PROTONIX) 40 MG tablet Take 1 tablet (40 mg total) by mouth 2 (two) times daily before a meal. 09/09/22  Yes Lewie Chamber, MD  potassium chloride (KLOR-CON) 10 MEQ tablet Take 20 mEq by mouth in the morning and at bedtime.   Yes [provider]  promethazine (PHENERGAN) 12.5 MG tablet Take 12.5 mg by mouth every 6 (six) hours as needed for nausea or vomiting.   Yes [provider]  rifaximin (XIFAXAN) 550 MG TABS tablet Take 550 mg by mouth 2 (two) times daily.   Yes [provider]  rOPINIRole (REQUIP) 0.25 MG tablet Take 1 tablet (0.25 mg total) by mouth at bedtime. 09/09/22  Yes Lewie Chamber, MD  spironolactone (ALDACTONE) 25 MG tablet Take 1 tablet (25 mg total) by mouth daily. 09/10/22  Yes Lewie Chamber, MD  tamsulosin (FLOMAX) 0.4 MG CAPS capsule Take 1 capsule (0.4 mg total) by mouth daily. 09/10/22  Yes Lewie Chamber, MD  triamcinolone cream (KENALOG) 0.5 % Apply to affected area of right breast twice daily as directed. Patient taking differently: Apply 1 Application topically 2 (two) times daily as needed (for irritation-  right breast). 05/10/22  Yes Osvaldo Shipper, MD  TYLENOL 500 MG tablet Take 500 mg by mouth every 6 (six) hours as needed for mild pain or headache.   Yes [provider]  venlafaxine XR (EFFEXOR-XR) 75 MG 24 hr capsule Take 75 mg by mouth daily with breakfast. 08/17/21  Yes [provider]  Vitamin D, Ergocalciferol, (DRISDOL) 50000 UNITS CAPS Take 50,000 Units by mouth every Wednesday.   Yes [provider]  hydrOXYzine (ATARAX/VISTARIL) 25 MG tablet Take 12.5 mg by mouth 3 (three) times daily as needed for itching. Patient not taking: Reported on 10/17/2022    [provider]    Current Facility-Administered Medications  Medication Dose Route Frequency Provider Last Rate Last Admin   dicyclomine (BENTYL) capsule 10 mg  10 mg Oral TID PRN Alwyn Ren, MD       lactulose (CHRONULAC) 10 GM/15ML solution 20 g  20 g Oral TID Alwyn Ren,  MD   20 g at 10/18/22 1348   levothyroxine (SYNTHROID) tablet 150 mcg  150 mcg Oral QAC breakfast Alwyn Ren, MD       megestrol (MEGACE) tablet 40 mg  40 mg Oral BID Alwyn Ren, MD   40 mg at 10/18/22 1346   midodrine (PROAMATINE) tablet 10 mg  10 mg Oral TID WC Alwyn Ren, MD   10 mg at 10/18/22 1352   octreotide (SANDOSTATIN) 500 mcg in sodium chloride 0.9 % 250 mL (2 mcg/mL) infusion  50 mcg/hr Intravenous Continuous Garlon Hatchet, PA-C   Stopped at 10/18/22 1051   pantoprazole (PROTONIX) injection 40 mg  40 mg Intravenous Q12H Briscoe Deutscher, MD   40 mg at 10/18/22 1610   rifaximin (XIFAXAN) tablet 550 mg  550 mg Oral BID Alwyn Ren, MD   550 mg at 10/18/22 1346   tamsulosin (FLOMAX) capsule 0.4 mg  0.4 mg Oral Daily Alwyn Ren, MD   0.4 mg at 10/18/22 1346   venlafaxine XR (EFFEXOR-XR) 24 hr capsule 75 mg  75 mg Oral Q breakfast Alwyn Ren, MD        Allergies as of 10/17/2022 - Review Complete 10/17/2022  Allergen Reaction Noted   Ace  inhibitors Anaphylaxis and Swelling 06/05/2011   Ivp dye [iodinated contrast media] Anaphylaxis 06/05/2011   Desitin [zinc oxide] Rash and Other (See Comments) 08/21/2022   Hydroxyzine Other (See Comments) 10/17/2022   Chlorhexidine Rash and Other (See Comments) 05/02/2022   Doxycycline Rash 07/05/2015   Penicillins Rash 06/05/2011   Zofran [ondansetron] Rash 07/05/2015    History reviewed. No pertinent family history.  Social History   Socioeconomic History   Marital status: Single    Spouse name: Not on file   Number of children: 1   Years of education: Not on file   Highest education level: Not on file  Occupational History   Not on file  Tobacco Use   Smoking status: Former    Current packs/day: 0.00    Types: Cigarettes    Quit date: 67    Years since quitting: 37.7    Passive exposure: Past   Smokeless tobacco: Never  Vaping Use   Vaping status: Never Used  Substance and Sexual Activity   Alcohol use: No   Drug use: No   Sexual activity: Not Currently    Birth control/protection: Post-menopausal  Other Topics Concern   Not on file  Social History Narrative   Not on file   Social Determinants of Health   Financial Resource Strain: Not on file  Food Insecurity: No Food Insecurity (09/30/2022)   Hunger Vital Sign    Worried About Running Out of Food in the Last Year: Never true    Ran Out of Food in the Last Year: Never true  Transportation Needs: No Transportation Needs (09/30/2022)   PRAPARE - Administrator, Civil Service (Medical): No    Lack of Transportation (Non-Medical): No  Physical Activity: Not on file  Stress: Not on file  Social Connections: Not on file  Intimate Partner Violence: Not At Risk (09/30/2022)   Humiliation, Afraid, Rape, and Kick questionnaire    Fear of Current or Ex-Partner: No    Emotionally Abused: No    Physically Abused: No    Sexually Abused: No    Review of Systems: As per HPI Physical Exam: Vital signs  in last 24 hours: Temp:  [97.9 F (36.6 C)-98.8 F (37.1  C)] 98.6 F (37 C) (10/08 1446) Pulse Rate:  [82-98] 86 (10/08 1446) Resp:  [16-27] 16 (10/08 1446) BP: (122-146)/(59-101) 139/66 (10/08 1446) SpO2:  [96 %-100 %] 100 % (10/08 1446)    General:   Alert,  Well-developed, obese, pleasant and cooperative in NAD Head:  Normocephalic and atraumatic. Eyes:  Blindness in left eye Ears:  Normal auditory acuity. Nose:  No deformity, discharge,  or lesions. Mouth:  No deformity or lesions.  Oropharynx pink & moist. Neck:  Supple; no masses or thyromegaly. Lungs:  Clear throughout to auscultation.   No wheezes, crackles, or rhonchi. No acute distress. Heart:  Regular rate and rhythm; no murmurs, clicks, rubs,  or gallops. Extremities:  Without clubbing or edema. Neurologic: Awake, oriented to place, not to time or person, no asterixis. Skin:  Intact without significant lesions or rashes. Psych:  Alert and cooperative. Normal mood and affect. Abdomen:  Soft, nontender and nondistended. No masses, hepatosplenomegaly or hernias noted. Normal bowel sounds, without guarding, and without rebound.         Lab Results: Recent Labs    10/17/22 1205 10/18/22 0031 10/18/22 0525  WBC 5.5 5.3 5.5  HGB 8.4* 7.8* 8.3*  HCT 27.3* 24.7* 26.8*  PLT 161 158 157   BMET Recent Labs    10/17/22 1205 10/18/22 0525  NA 140 144  K 3.7 3.9  CL 110 112*  CO2 23 19*  GLUCOSE 104* 80  BUN 24* 21  CREATININE 1.11* 0.73  CALCIUM 8.2* 8.4*   LFT Recent Labs    10/18/22 0525  PROT 6.0*  ALBUMIN 2.4*  AST 44*  ALT 26  ALKPHOS 103  BILITOT 2.2*   PT/INR Recent Labs    10/17/22 1425  LABPROT 19.2*  INR 1.6*    Studies/Results: MR BRAIN WO CONTRAST  Result Date: 10/17/2022 CLINICAL DATA:  Initial evaluation for mental status change, unknown cause. EXAM: MRI HEAD WITHOUT CONTRAST TECHNIQUE: Multiplanar, multiecho pulse sequences of the brain and surrounding structures were obtained  without intravenous contrast. COMPARISON:  Prior CT from earlier the same day. FINDINGS: Brain: Examination moderately to severely degraded by motion artifact. Cerebral volume within normal limits. Patchy T2/FLAIR hyperintensity involving the periventricular and deep white matter, most likely root related to chronic microvascular ischemic disease, moderately advanced in nature. No visible foci of restricted diffusion to suggest acute or subacute ischemia. Gray-white matter differentiation maintained. No visible acute or chronic intracranial blood products. No visible mass lesion, midline shift or mass effect. No hydrocephalus or extra-axial fluid collection. Pituitary gland and suprasellar region within normal limits. Vascular: Major intracranial vascular flow voids are grossly maintained. Skull and upper cervical spine: Craniocervical junction within normal limits. Bone marrow signal intensity grossly normal. No scalp soft tissue abnormality. Sinuses/Orbits: Prior ocular lens replacement on the left. Paranasal sinuses are largely clear. No significant mastoid effusion. Other: None. IMPRESSION: 1. Motion degraded exam. 2. No definite acute intracranial abnormality. 3. Moderately advanced chronic microvascular ischemic disease. Electronically Signed   By: Rise Mu M.D.   On: 10/17/2022 21:05   CT Head Wo Contrast  Result Date: 10/17/2022 CLINICAL DATA:  ams EXAM: CT HEAD WITHOUT CONTRAST TECHNIQUE: Contiguous axial images were obtained from the base of the skull through the vertex without intravenous contrast. RADIATION DOSE REDUCTION: This exam was performed according to the departmental dose-optimization program which includes automated exposure control, adjustment of the mA and/or kV according to patient size and/or use of iterative reconstruction technique. COMPARISON:  CT Head 09/30/22  FINDINGS: Brain: No hemorrhage. No hydrocephalus. No extra-axial fluid collection. No CT evidence of an acute  cortical infarct. No mass effect. No mass lesion. There is sequela of mild chronic microvascular ischemic change. Apparent linear hypodensity through the right cerebellum is likely artifactual and related to streak artifact. Vascular: No hyperdense vessel or unexpected calcification. Skull: Normal. Negative for fracture or focal lesion. Sinuses/Orbits: No middle ear or mastoid effusion. Paranasal sinuses are clear. Left lens replacement. Orbits are otherwise unremarkable. Other: None. IMPRESSION: No acute intracranial abnormality. Apparent linear hypodensity through the right cerebellum is likely artifactual and related to streak artifact. If there is clinical concern for acute infarct, consider further evaluation with MRI. Electronically Signed   By: Lorenza Cambridge M.D.   On: 10/17/2022 14:06    Impression: Hematemesis with concerns for esophageal variceal bleeding-clinically stable Normal BUN/creatinine ratio Hemoglobin at baseline, stable 7.8/8.3  History of decompensated cirrhosis, MELD sodium(TB 2.2, Na 144, Cr 0.73, INR 1.6) 15 History of hepatic encephalopathy-on lactulose and Xifaxan at home History of ascites-on furosemide 40 mg once a day and spironolactone 25 mg a day at home  Plan: Plan diagnostic EGD in a.m., possible banding if needed. Continue supportive management, has been started on octreotide 50 mcg/h IV, continue Protonix 40 mg every 12 hours.   LOS: 1 day   Kerin Salen, MD  10/18/2022, 3:04 PM

## 2022-10-18 NOTE — Consult Note (Signed)
[facility] Consult  Referring Provider: [facility] hospitalist/Dr. Jerolyn Center Primary Care Physician:  [name], MD Primary Gastroenterologist: [facility]/unassigned  Reason for Consultation: Hematemesis  HPI: [name] is a 67 y.o. female with history of [name] related cirrhosis came to the ER with confusion. In ER she was found to have blood around her mouth and neck and suspected to have hematemesis. Due to history of cirrhosis, variceal bleeding suspected. Patient states she does not recall being brought to the hospital, is not sure if she is recently been vomiting blood or has had any change in the color of the stool.  Patient has had multiple recent hospitalization for fever, confusion, septic shock, cellulitis, UTI and outpatient endoscopy was being planned, last seen by her GI on 08/07/22  Prior GI workup: EGD and colonoscopy 03/2019:normal esophagus, small HH o/w normal stomach (bx neg for H pylori), normal duodenum (bx neg for Celiac), BBPS (9), s/p ileocecectomy with normal neo-TI (5cm), normal colonic mucosa (bx neg for colitis), internal hemorrhoids, and a 7mm sessile TA polyp was removed. Repeat colonoscopy due in 3/28.     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 Authorizing Provider  atorvastatin (LIPITOR) 40 MG  tablet Take 40 mg by mouth in the morning. 01/19/22  Yes [provider]  ciclopirox (PENLAC) 8 % solution Apply 1 application  topically See admin instructions. Apply over all toenails and surrounding skin at bedtime. Apply daily over previous coat. After seven (7) days, may remove with alcohol and continue cycle.   Yes [provider]  colchicine 0.6 MG tablet Take 0.6 mg by mouth daily as needed (gout flares).   Yes [provider]  cyanocobalamin (VITAMIN B12) 1000 MCG tablet Take 2,000 mcg by mouth every Friday.   Yes [provider]  dicyclomine (BENTYL) 10 MG capsule Take 1 capsule (10 mg total) by mouth 3 (three) times daily as needed for spasms (abdominal cramping). 09/09/22  Yes Lewie Chamber, MD  EPINEPHrine 0.3 mg/0.3 mL IJ SOAJ injection Inject 0.3 mg into the muscle as needed for anaphylaxis. 08/07/19  Yes [provider]  folic acid (FOLVITE) 1 MG tablet Take 1 tablet (1 mg total) by mouth daily. 06/16/22  Yes Calvert Cantor, MD  furosemide (LASIX) 40 MG tablet Take 1 tablet (40 mg total) by mouth 2 (two) times daily. 09/09/22  Yes Lewie Chamber, MD  HYDROcodone-acetaminophen (NORCO) 10-325 MG tablet Take 1 tablet by mouth 3 (three) times daily as needed for moderate pain or severe pain. 02/25/22  Yes [provider]  IRON-VITAMIN C PO Take 1 tablet by mouth daily with breakfast.   Yes [provider]  lactulose (CHRONULAC) 10 GM/15ML solution Take 30 mLs (20 g total) by mouth 3 (three) times daily. Patient taking differently: Take 20 g by mouth 3 (three) times daily as needed for mild constipation or moderate constipation. 09/09/22  Yes Lewie Chamber, MD  levothyroxine (SYNTHROID) 150 MCG tablet Take 150 mcg by mouth daily before breakfast. 10/26/21  Yes [provider]  magnesium gluconate (MAGONATE) 500 MG tablet Take 1,000 mg by mouth daily.   Yes [provider]  megestrol (MEGACE) 40 MG tablet Take 1 tablet  (40 mg total) by mouth 2 (two) times daily. 10/11/22 11/10/22 Yes Carollee Herter, DO  methocarbamol (ROBAXIN) 500 MG tablet Take 500 mg by mouth in the morning. 03/04/22  Yes [provider]  midodrine (PROAMATINE) 10 MG tablet Take 1 tablet (10 mg total) by mouth 3 (three) times daily with meals. 09/09/22  Yes Lewie Chamber, MD  naloxone [facility]) nasal spray 4 mg/0.1 mL Place 1 spray into the nose as needed (accidental overdose). 03/25/22  Yes [provider]  nystatin cream (MYCOSTATIN) Apply topically 2 (two) times daily. Patient taking differently: Apply 1 Application topically 2 (two) times daily as needed for dry skin (or irritation). 05/22/22  Yes Leatha Gilding, MD  nystatin powder Apply 1 Application topically 3 (three) times daily as needed (for irritation- affected areas).   Yes [provider]  pantoprazole (PROTONIX) 40 MG tablet Take 1 tablet (40 mg total) by mouth 2 (two) times daily before a meal. 09/09/22  Yes Lewie Chamber, MD  potassium chloride (KLOR-CON) 10 MEQ tablet Take 20 mEq by mouth in the morning and at bedtime.   Yes [provider]  promethazine (PHENERGAN) 12.5 MG tablet Take 12.5 mg by mouth every 6 (six) hours as needed for nausea or vomiting.   Yes [provider]  rifaximin (XIFAXAN) 550 MG TABS tablet Take 550 mg by mouth 2 (two) times daily.   Yes [provider]  rOPINIRole (REQUIP) 0.25 MG tablet Take 1 tablet (0.25 mg total) by mouth at bedtime. 09/09/22  Yes Lewie Chamber, MD  spironolactone (ALDACTONE) 25 MG tablet Take 1 tablet (25 mg total) by mouth daily. 09/10/22  Yes Lewie Chamber, MD  tamsulosin (FLOMAX) 0.4 MG CAPS capsule Take 1 capsule (0.4 mg total) by mouth daily. 09/10/22  Yes Lewie Chamber, MD  triamcinolone cream (KENALOG) 0.5 % Apply to affected area of right breast twice daily as directed. Patient taking differently: Apply 1 Application topically 2 (two) times daily as needed (for irritation-  right breast). 05/10/22  Yes Osvaldo Shipper, MD  TYLENOL 500 MG tablet Take 500 mg by mouth every 6 (six) hours as needed for mild pain or headache.   Yes [provider]  venlafaxine XR (EFFEXOR-XR) 75 MG 24 hr capsule Take 75 mg by mouth daily with breakfast. 08/17/21  Yes [provider]  Vitamin D, Ergocalciferol, (DRISDOL) 50000 UNITS CAPS Take 50,000 Units by mouth every Wednesday.   Yes [provider]  hydrOXYzine (ATARAX/VISTARIL) 25 MG tablet Take 12.5 mg by mouth 3 (three) times daily as needed for itching. Patient not taking: Reported on 10/17/2022    [provider]    Current Facility-Administered Medications  Medication Dose Route Frequency Provider Last Rate Last Admin   dicyclomine (BENTYL) capsule 10 mg  10 mg Oral TID PRN Alwyn Ren, MD       lactulose (CHRONULAC) 10 GM/15ML solution 20 g  20 g Oral TID Alwyn Ren,  MD   20 g at 10/18/22 1348   levothyroxine (SYNTHROID) tablet 150 mcg  150 mcg Oral QAC breakfast Alwyn Ren, MD       megestrol (MEGACE) tablet 40 mg  40 mg Oral BID Alwyn Ren, MD   40 mg at 10/18/22 1346   midodrine (PROAMATINE) tablet 10 mg  10 mg Oral TID WC Alwyn Ren, MD   10 mg at 10/18/22 1352   octreotide (SANDOSTATIN) 500 mcg in sodium chloride 0.9 % 250 mL (2 mcg/mL) infusion  50 mcg/hr Intravenous Continuous Garlon Hatchet, PA-C   Stopped at 10/18/22 1051   pantoprazole (PROTONIX) injection 40 mg  40 mg Intravenous Q12H Briscoe Deutscher, MD   40 mg at 10/18/22 1610   rifaximin (XIFAXAN) tablet 550 mg  550 mg Oral BID Alwyn Ren, MD   550 mg at 10/18/22 1346   tamsulosin (FLOMAX) capsule 0.4 mg  0.4 mg Oral Daily Alwyn Ren, MD   0.4 mg at 10/18/22 1346   venlafaxine XR (EFFEXOR-XR) 24 hr capsule 75 mg  75 mg Oral Q breakfast Alwyn Ren, MD        Allergies as of 10/17/2022 - Review Complete 10/17/2022  Allergen Reaction Noted   Ace  inhibitors Anaphylaxis and Swelling 06/05/2011   Ivp dye [iodinated contrast media] Anaphylaxis 06/05/2011   Desitin [zinc oxide] Rash and Other (See Comments) 08/21/2022   Hydroxyzine Other (See Comments) 10/17/2022   Chlorhexidine Rash and Other (See Comments) 05/02/2022   Doxycycline Rash 07/05/2015   Penicillins Rash 06/05/2011   Zofran [ondansetron] Rash 07/05/2015    History reviewed. No pertinent family history.  Social History   Socioeconomic History   Marital status: Single    Spouse name: Not on file   Number of children: 1   Years of education: Not on file   Highest education level: Not on file  Occupational History   Not on file  Tobacco Use   Smoking status: Former    Current packs/day: 0.00    Types: Cigarettes    Quit date: 67    Years since quitting: 37.7    Passive exposure: Past   Smokeless tobacco: Never  Vaping Use   Vaping status: Never Used  Substance and Sexual Activity   Alcohol use: No   Drug use: No   Sexual activity: Not Currently    Birth control/protection: Post-menopausal  Other Topics Concern   Not on file  Social History Narrative   Not on file   Social Determinants of Health   Financial Resource Strain: Not on file  Food Insecurity: No Food Insecurity (09/30/2022)   Hunger Vital Sign    Worried About Running Out of Food in the Last Year: Never true    Ran Out of Food in the Last Year: Never true  Transportation Needs: No Transportation Needs (09/30/2022)   PRAPARE - Administrator, Civil Service (Medical): No    Lack of Transportation (Non-Medical): No  Physical Activity: Not on file  Stress: Not on file  Social Connections: Not on file  Intimate Partner Violence: Not At Risk (09/30/2022)   Humiliation, Afraid, Rape, and Kick questionnaire    Fear of Current or Ex-Partner: No    Emotionally Abused: No    Physically Abused: No    Sexually Abused: No    Review of Systems: As per HPI Physical Exam: Vital signs  in last 24 hours: Temp:  [97.9 F (36.6 C)-98.8 F (37.1  C)] 98.6 F (37 C) (10/08 1446) Pulse Rate:  [82-98] 86 (10/08 1446) Resp:  [16-27] 16 (10/08 1446) BP: (122-146)/(59-101) 139/66 (10/08 1446) SpO2:  [96 %-100 %] 100 % (10/08 1446)    General:   Alert,  Well-developed, obese, pleasant and cooperative in NAD Head:  Normocephalic and atraumatic. Eyes:  Blindness in left eye Ears:  Normal auditory acuity. Nose:  No deformity, discharge,  or lesions. Mouth:  No deformity or lesions.  Oropharynx pink & moist. Neck:  Supple; no masses or thyromegaly. Lungs:  Clear throughout to auscultation.   No wheezes, crackles, or rhonchi. No acute distress. Heart:  Regular rate and rhythm; no murmurs, clicks, rubs,  or gallops. Extremities:  Without clubbing or edema. Neurologic: Awake, oriented to place, not to time or person, no asterixis. Skin:  Intact without significant lesions or rashes. Psych:  Alert and cooperative. Normal mood and affect. Abdomen:  Soft, nontender and nondistended. No masses, hepatosplenomegaly or hernias noted. Normal bowel sounds, without guarding, and without rebound.         Lab Results: Recent Labs    10/17/22 1205 10/18/22 0031 10/18/22 0525  WBC 5.5 5.3 5.5  HGB 8.4* 7.8* 8.3*  HCT 27.3* 24.7* 26.8*  PLT 161 158 157   BMET Recent Labs    10/17/22 1205 10/18/22 0525  NA 140 144  K 3.7 3.9  CL 110 112*  CO2 23 19*  GLUCOSE 104* 80  BUN 24* 21  CREATININE 1.11* 0.73  CALCIUM 8.2* 8.4*   LFT Recent Labs    10/18/22 0525  PROT 6.0*  ALBUMIN 2.4*  AST 44*  ALT 26  ALKPHOS 103  BILITOT 2.2*   PT/INR Recent Labs    10/17/22 1425  LABPROT 19.2*  INR 1.6*    Studies/Results: MR BRAIN WO CONTRAST  Result Date: 10/17/2022 CLINICAL DATA:  Initial evaluation for mental status change, unknown cause. EXAM: MRI HEAD WITHOUT CONTRAST TECHNIQUE: Multiplanar, multiecho pulse sequences of the brain and surrounding structures were obtained  without intravenous contrast. COMPARISON:  Prior CT from earlier the same day. FINDINGS: Brain: Examination moderately to severely degraded by motion artifact. Cerebral volume within normal limits. Patchy T2/FLAIR hyperintensity involving the periventricular and deep white matter, most likely root related to chronic microvascular ischemic disease, moderately advanced in nature. No visible foci of restricted diffusion to suggest acute or subacute ischemia. Gray-white matter differentiation maintained. No visible acute or chronic intracranial blood products. No visible mass lesion, midline shift or mass effect. No hydrocephalus or extra-axial fluid collection. Pituitary gland and suprasellar region within normal limits. Vascular: Major intracranial vascular flow voids are grossly maintained. Skull and upper cervical spine: Craniocervical junction within normal limits. Bone marrow signal intensity grossly normal. No scalp soft tissue abnormality. Sinuses/Orbits: Prior ocular lens replacement on the left. Paranasal sinuses are largely clear. No significant mastoid effusion. Other: None. IMPRESSION: 1. Motion degraded exam. 2. No definite acute intracranial abnormality. 3. Moderately advanced chronic microvascular ischemic disease. Electronically Signed   By: Rise Mu M.D.   On: 10/17/2022 21:05   CT Head Wo Contrast  Result Date: 10/17/2022 CLINICAL DATA:  ams EXAM: CT HEAD WITHOUT CONTRAST TECHNIQUE: Contiguous axial images were obtained from the base of the skull through the vertex without intravenous contrast. RADIATION DOSE REDUCTION: This exam was performed according to the departmental dose-optimization program which includes automated exposure control, adjustment of the mA and/or kV according to patient size and/or use of iterative reconstruction technique. COMPARISON:  CT Head 09/30/22  FINDINGS: Brain: No hemorrhage. No hydrocephalus. No extra-axial fluid collection. No CT evidence of an acute  cortical infarct. No mass effect. No mass lesion. There is sequela of mild chronic microvascular ischemic change. Apparent linear hypodensity through the right cerebellum is likely artifactual and related to streak artifact. Vascular: No hyperdense vessel or unexpected calcification. Skull: Normal. Negative for fracture or focal lesion. Sinuses/Orbits: No middle ear or mastoid effusion. Paranasal sinuses are clear. Left lens replacement. Orbits are otherwise unremarkable. Other: None. IMPRESSION: No acute intracranial abnormality. Apparent linear hypodensity through the right cerebellum is likely artifactual and related to streak artifact. If there is clinical concern for acute infarct, consider further evaluation with MRI. Electronically Signed   By: Lorenza Cambridge M.D.   On: 10/17/2022 14:06    Impression: Hematemesis with concerns for esophageal variceal bleeding-clinically stable Normal BUN/creatinine ratio Hemoglobin at baseline, stable 7.8/8.3  History of decompensated cirrhosis, MELD sodium(TB 2.2, Na 144, Cr 0.73, INR 1.6) 15 History of hepatic encephalopathy-on lactulose and Xifaxan at home History of ascites-on furosemide 40 mg once a day and spironolactone 25 mg a day at home  Plan: Plan diagnostic EGD in a.m., possible banding if needed. Continue supportive management, has been started on octreotide 50 mcg/h IV, continue Protonix 40 mg every 12 hours.   LOS: 1 day   Kerin Salen, MD  10/18/2022, 3:04 PM

## 2022-10-18 NOTE — Progress Notes (Signed)
PROGRESS NOTE    [name]  WUJ:[id number] DOB: 1955/06/12 DOA: 10/17/2022 PCP: [name], MD   Brief Narrative: 67 y.o. female with medical history significant of known alcoholic fatty liver disease, chronic diarrhea,(takes lactulose only if she does not move bm 3 to 4 times daily)  hypertension, obesity, PE, short gut syndrome, hypothyroidism, cholelithiasis, admitted with increasing confusion.  Patient lives at home with her son.  According to son she has been acting confused  Son noticed that she is more weak and confused than her baseline she had difficulty getting dressed and getting out of the house. Patient was recently admitted to the hospital 09/30/2022 discharged on 10/12/2022. Recent hospital admission she had sepsis secondary to Pseudomonas UTI and bacteremia.  Blood culture grew ESBL Pseudomonas treated with meropenem completed on 10/11/2022.  Follow-up blood cultures were negative.  She had vaginal bleeding she was started on Megace she was due to follow-up with gynecologist later this month.  Son tells me she was also is due to see infectious disease on 10/17/2022 but has not seen ended up in the hospital. Denies having sleep apnea.  Denies urinary complaints. She has some rash on the center of her chest which apparently is a cat scratch from her cat. Son reported she bites her tongue at times no history of seizures or incontinence reported ED Course: TSH 4.5 normal white count ammonia level 81 vital signs stable ABG unremarkable, CT head no acute intracranial abnormality.  Hypodensity through the right cerebellum artifact likely.   Assessment & Plan:   Principal Problem:   Hepatic encephalopathy (HCC) Active Problems:   Acute metabolic encephalopathy   Cirrhosis due to NASH   Iron deficiency anemia   Morbid obesity (HCC)  #1 acute metabolic encephalopathy secondary to hepatic encephalopathy  Her ammonia level went up to 134 from 81.  She did not get any lactulose p.o.    I have changed lactulose to enema 3 times daily.  Continue rifaximin.     UA appears dirty.  Urine culture blood cultures ordered.  Status post recent ESBL Pseudomonas UTI and bacteremia treated with meropenem. Will hold off on starting antibiotics tonight.  Son tells me she was told by the GI doctor that she should not take lactulose on a regular basis because of chronic diarrhea they only give lactulose if she has not had a BM 3-4 BM per day. She is not febrile her white count is normal Her Lasix and Aldactone are on hold as she has no p.o. intake.  She still appears dry. (Not on ivf)   #2 NASH continue rifaximin and lactulose   #3 chronic hypotension continue midodrine   #4 hypothyroidism continue levothyroxine   #5 thrombocytopenia stable on Lovenox monitor platelets   #6 CODE STATUS patient is DNR DNI discussed with son   #7 recent vaginal bleeding has appointment with GYN in 11/03/2022 was started on Megace during her last hospital stay continue   #8 anemia of chronic disease hemoglobin stable  Appreciate GI consult.  She was started on octreotide overnight.  Continue Protonix.  EGD in AM.    # 9 CT scan with cerebellar infarct could not rule out acute stroke recommending MRI brain-no evidence of acute stroke.  Estimated body mass index is 39.32 kg/m as calculated from the following:   Height as of this encounter: 5\' 2"  (1.575 m).   Weight as of this encounter: 97.5 kg.  DVT prophylaxis: Lovenox Code Status: DNR DNI Family Communication: Son at the  bedside  disposition Plan:  Status is: Inpatient Remains inpatient appropriate because: Hepatic encephalopathy   Consultants:  GI  Procedures: None Antimicrobials: None  Subjective: Son at the bedside she responds better to her son she told him she is in [facility] she was able to tell her name and date of birth and her son's name Overnight started on octreotide as the staff was concerned that she vomited blood   Son tells me that she bites her tongue and have some bleeding from the tongue Unclear how much she vomited blood if she did vomit blood  objective: Vitals:   10/18/22 0700 10/18/22 1000 10/18/22 1024 10/18/22 1446  BP: (!) 146/82 130/70  139/66  Pulse: 95 82  86  Resp: 17 17  16   Temp:   98.6 F (37 C) 98.6 F (37 C)  TempSrc:   Oral Oral  SpO2: 100% 100%  100%  Weight:      Height:        Intake/Output Summary (Last 24 hours) at 10/18/2022 1550 Last data filed at 10/18/2022 1500 Gross per 24 hour  Intake 451.51 ml  Output --  Net 451.51 ml   Filed Weights   10/17/22 1133 10/17/22 1148  Weight: 100 kg 97.5 kg    Examination:  General exam: Appears chronically ill-appearing Respiratory system: Clear to auscultation. Respiratory effort normal. Cardiovascular system: S1 & S2 heard, RRR. No JVD, murmurs, rubs, gallops or clicks. No pedal edema. Gastrointestinal system: Abdomen is distended, soft and nontender. No organomegaly or masses felt. Normal bowel sounds heard. Central nervous system: Lethargic follows some commands  extremities: No edema    Data Reviewed: I have personally reviewed following labs and imaging studies  CBC: Recent Labs  Lab 10/12/22 0602 10/17/22 1205 10/18/22 0031 10/18/22 0525  WBC 5.6 5.5 5.3 5.5  NEUTROABS  --   --  2.7  --   HGB 7.4* 8.4* 7.8* 8.3*  HCT 23.6* 27.3* 24.7* 26.8*  MCV 94.4 95.8 95.0 96.4  PLT 137* 161 158 157   Basic Metabolic Panel: Recent Labs  Lab 10/12/22 0602 10/17/22 1205 10/18/22 0525  NA 136 140 144  K 4.0 3.7 3.9  CL 105 110 112*  CO2 23 23 19*  GLUCOSE 116* 104* 80  BUN 26* 24* 21  CREATININE 0.89 1.11* 0.73  CALCIUM 8.0* 8.2* 8.4*   GFR: Estimated Creatinine Clearance: 76.5 mL/min (by C-G formula based on SCr of 0.73 mg/dL). Liver Function Tests: Recent Labs  Lab 10/17/22 1205 10/18/22 0525  AST 35 44*  ALT 28 26  ALKPHOS 108 103  BILITOT 1.9* 2.2*  PROT 5.9* 6.0*  ALBUMIN 2.4* 2.4*    No results for input(s): "LIPASE", "AMYLASE" in the last 168 hours. Recent Labs  Lab 10/17/22 1425  AMMONIA 81*   Coagulation Profile: Recent Labs  Lab 10/17/22 1425  INR 1.6*   Cardiac Enzymes: No results for input(s): "CKTOTAL", "CKMB", "CKMBINDEX", "TROPONINI" in the last 168 hours. BNP (last 3 results) No results for input(s): "PROBNP" in the last 8760 hours. HbA1C: No results for input(s): "HGBA1C" in the last 72 hours. CBG: Recent Labs  Lab 10/17/22 1118  GLUCAP 99   Lipid Profile: No results for input(s): "CHOL", "HDL", "LDLCALC", "TRIG", "CHOLHDL", "LDLDIRECT" in the last 72 hours. Thyroid Function Tests: Recent Labs    10/17/22 1425  TSH 4.539*  FREET4 1.49*   Anemia Panel: No results for input(s): "VITAMINB12", "FOLATE", "FERRITIN", "TIBC", "IRON", "RETICCTPCT" in the last 72 hours. Sepsis  Labs: No results for input(s): "PROCALCITON", "LATICACIDVEN" in the last 168 hours.  Recent Results (from the past 240 hour(s))  Culture, blood (Routine X 2) w Reflex to ID Panel     Status: None (Preliminary result)   Collection Time: 10/17/22 12:30 AM   Specimen: BLOOD  Result Value Ref Range Status   Specimen Description   Final    BLOOD LEFT ANTECUBITAL Performed at [facility], [address] [place], [place] [address]    Special Requests   Final    BOTTLES DRAWN AEROBIC AND ANAEROBIC Blood Culture results may not be optimal due to an inadequate volume of blood received in culture bottles Performed at [facility], [address], [place], [place] [address]    Culture   Final    NO GROWTH < 12 HOURS Performed at [facility], [address] [place], [place] [address]    Report Status PENDING  Incomplete         Radiology Studies: MR BRAIN WO CONTRAST  Result Date: 10/17/2022 CLINICAL DATA:  Initial evaluation for mental status change, unknown cause. EXAM: MRI HEAD WITHOUT CONTRAST TECHNIQUE:  Multiplanar, multiecho pulse sequences of the brain and surrounding structures were obtained without intravenous contrast. COMPARISON:  Prior CT from earlier the same day. FINDINGS: Brain: Examination moderately to severely degraded by motion artifact. Cerebral volume within normal limits. Patchy T2/FLAIR hyperintensity involving the periventricular and deep white matter, most likely root related to chronic microvascular ischemic disease, moderately advanced in nature. No visible foci of restricted diffusion to suggest acute or subacute ischemia. Gray-white matter differentiation maintained. No visible acute or chronic intracranial blood products. No visible mass lesion, midline shift or mass effect. No hydrocephalus or extra-axial fluid collection. Pituitary gland and suprasellar region within normal limits. Vascular: Major intracranial vascular flow voids are grossly maintained. Skull and upper cervical spine: Craniocervical junction within normal limits. Bone marrow signal intensity grossly normal. No scalp soft tissue abnormality. Sinuses/Orbits: Prior ocular lens replacement on the left. Paranasal sinuses are largely clear. No significant mastoid effusion. Other: None. IMPRESSION: 1. Motion degraded exam. 2. No definite acute intracranial abnormality. 3. Moderately advanced chronic microvascular ischemic disease. Electronically Signed   By: Rise Mu M.D.   On: 10/17/2022 21:05   CT Head Wo Contrast  Result Date: 10/17/2022 CLINICAL DATA:  ams EXAM: CT HEAD WITHOUT CONTRAST TECHNIQUE: Contiguous axial images were obtained from the base of the skull through the vertex without intravenous contrast. RADIATION DOSE REDUCTION: This exam was performed according to the departmental dose-optimization program which includes automated exposure control, adjustment of the mA and/or kV according to patient size and/or use of iterative reconstruction technique. COMPARISON:  CT Head 09/30/22 FINDINGS: Brain: No  hemorrhage. No hydrocephalus. No extra-axial fluid collection. No CT evidence of an acute cortical infarct. No mass effect. No mass lesion. There is sequela of mild chronic microvascular ischemic change. Apparent linear hypodensity through the right cerebellum is likely artifactual and related to streak artifact. Vascular: No hyperdense vessel or unexpected calcification. Skull: Normal. Negative for fracture or focal lesion. Sinuses/Orbits: No middle ear or mastoid effusion. Paranasal sinuses are clear. Left lens replacement. Orbits are otherwise unremarkable. Other: None. IMPRESSION: No acute intracranial abnormality. Apparent linear hypodensity through the right cerebellum is likely artifactual and related to streak artifact. If there is clinical concern for acute infarct, consider further evaluation with MRI. Electronically Signed   By: Lorenza Cambridge M.D.   On: 10/17/2022 14:06  Scheduled Meds:  lactulose  20 g Oral TID   levothyroxine  150 mcg Oral QAC breakfast   megestrol  40 mg Oral BID   midodrine  10 mg Oral TID WC   pantoprazole (PROTONIX) IV  40 mg Intravenous Q12H   rifaximin  550 mg Oral BID   tamsulosin  0.4 mg Oral Daily   venlafaxine XR  75 mg Oral Q breakfast   Continuous Infusions:  octreotide (SANDOSTATIN) 500 mcg in sodium chloride 0.9 % 250 mL (2 mcg/mL) infusion Stopped (10/18/22 1051)     LOS: 1 day    Time spent: 39 min Alwyn Ren, MD  10/18/2022, 3:50 PM

## 2022-10-18 NOTE — ED Provider Notes (Addendum)
   12:22 AM Called to bedside as patient began vomiting blood.  Entered room and she has bright red blood all over face/neck.  She is very warm to touch.  Brief chart review-- NASH cirrhosis, admitted today for hepatic encephalopathy.  BP is 110's systolic.  Initiated IVF resuscitation, repeat CBC, type and screen.  Given her cirrhosis, high risk for varices so will start octreotide, rocephin.  Reglan given for vomiting as she has zofran allergy.  Night time hospitalist made aware.  They will reassess and add additional orders.  4:31 AM Called back into room for repeat hematemesis.  Small volume.  Patient still seems to be dry heaving now.  Denies pain, states nauseated.  Given dose of compazine.  Rounding floor NP made aware.   Garlon Hatchet, PA-C 10/18/22 0202    Garlon Hatchet, PA-C 10/18/22 0536    Nira Conn, MD 10/18/22 [phone]

## 2022-10-18 NOTE — ED Notes (Signed)
Pt too drowsy to take morning meds. Will attempt to give meds when breakfast arrives.

## 2022-10-18 NOTE — ED Notes (Signed)
ED TO INPATIENT HANDOFF REPORT  Name/Age/Gender [name] 67 y.o. female  Code Status    Code Status Orders  (From admission, onward)           Start     Ordered   10/17/22 1801  Do not attempt resuscitation (DNR) Pre-Arrest Interventions Desired  Continuous       Question Answer Comment  If pulseless and not breathing No CPR or chest compressions.   In Pre-Arrest Conditions (Patient Has Pulse and Is Breathing) May intubate, use advanced airway interventions and cardioversion/ACLS medications if appropriate or indicated. May transfer to ICU.   Consent: Discussion documented in EHR or advanced directives reviewed      10/17/22 1801           Code Status History     Date Active Date Inactive Code Status Order ID Comments User Context   09/30/2022 1247 10/12/2022 1639 Do not attempt resuscitation (DNR) PRE-ARREST INTERVENTIONS DESIRED [id number]  Bobette Mo, MD ED   08/15/2022 2212 09/09/2022 1909 DNR [id number]  Ranee Gosselin, MD Inpatient   08/11/2022 1109 08/15/2022 2211 DNR [id number]  Norton Blizzard, NP ED   06/12/2022 2222 06/15/2022 1908 Full Code [id number]  Orland Mustard, MD Inpatient   05/16/2022 2308 05/22/2022 1922 Full Code [id number]  Rometta Emery, MD ED   05/01/2022 0022 05/10/2022 2002 Full Code [id number]  Anselm Jungling, DO ED       Home/SNF/Other Home  Chief Complaint Hepatic encephalopathy (HCC) [K76.82]  Level of Care/Admitting Diagnosis ED Disposition     ED Disposition  Admit   Condition  --   Comment  Hospital Area: [facility] [[id number]]  Level of Care: Progressive [102]  Admit to Progressive based on following criteria: NEUROLOGICAL AND NEUROSURGICAL complex patients with significant risk of instability, who do not meet ICU criteria, yet require close observation or frequent assessment (< / = every 2 - 4 hours) with medical / nursing intervention.  Admit to Progressive based on following criteria: GI, ENDOCRINE disease  patients with GI bleeding, acute liver failure or pancreatitis, stable with diabetic ketoacidosis or thyrotoxicosis (hypothyroid) state.  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   pt stated on admission history review   Heart murmur    Hypertension    Kidney stone    Obesity    Pulmonary embolism (HCC)    Short gut syndrome    Thyroid disease     Allergies Allergies  Allergen Reactions   Ace Inhibitors Anaphylaxis and Swelling   Ivp Dye [Iodinated Contrast Media] Anaphylaxis   Desitin [Zinc Oxide] Rash and Other (See Comments)    Worsens rash   Hydroxyzine Other (See Comments)    Affects the mind   Chlorhexidine Rash and Other (See Comments)    WORSENS RASHES   Doxycycline Rash   Penicillins Rash   Zofran [Ondansetron] Rash    IV Location/Drains/Wounds Patient Lines/Drains/Airways Status     Active  Line/Drains/Airways     Name Placement date Placement time Site Days   Peripheral IV 10/18/22 20 G Anterior;Left Forearm 10/18/22  0030  Forearm  less than 1   Peripheral IV 10/18/22 Left Antecubital 10/18/22  0000  Antecubital  less than 1   Peripheral IV 10/18/22 20 G Posterior;Right Hand 10/18/22  0800  Hand  less than 1   Wound / Incision (Open or Dehisced) 05/02/22 (ITD) Intertriginous Dermatitis Breast Left;Lower 05/02/22  0200  Breast  169   Wound / Incision (Open or Dehisced) 05/02/22 (ITD)  Intertriginous Dermatitis Groin Bilateral 05/02/22  0200  Groin  169   Wound / Incision (Open or Dehisced) 05/02/22 (ITD) Intertriginous Dermatitis Knee Posterior;Bilateral 05/02/22  0200  Knee  169   Wound / Incision (Open or Dehisced) 08/11/22 Skin tear Buttocks Right;Medial two open pink scratches/tears in skin. 08/11/22  --  Buttocks  68   Wound / Incision (Open or Dehisced) 09/03/22 Skin tear Thigh Distal;Left;Posterior 09/03/22  1845  Thigh  45            Labs/Imaging Results for orders placed or performed during the hospital encounter of 10/17/22 (from the past 48 hour(s))  Culture, blood (Routine X 2) w Reflex to ID Panel     Status: None (Preliminary result)   Collection Time: 10/17/22 12:30 AM   Specimen: BLOOD  Result Value Ref Range   Specimen Description      BLOOD LEFT ANTECUBITAL Performed at [facility], [address] [place], [place] [address]    Special Requests      BOTTLES DRAWN AEROBIC AND ANAEROBIC Blood Culture results may not be optimal due to an inadequate volume of blood received in culture bottles Performed at [facility], [address], [place], [place] [address]    Culture      NO GROWTH < 12 HOURS Performed at [facility], [address] [place], [place] [address]    Report Status PENDING   CBG monitoring, ED     Status: None   Collection Time: 10/17/22 11:18 AM  Result Value Ref Range   Glucose-Capillary 99 70 - 99 mg/dL    Comment: Glucose reference range applies only to samples taken after fasting for at least 8 hours.  Comprehensive metabolic panel     Status: Abnormal   Collection Time: 10/17/22 12:05 PM  Result Value Ref Range   Sodium 140 135 - 145 mmol/L   Potassium 3.7 3.5 - 5.1 mmol/L   Chloride 110 98 - 111 mmol/L   CO2 23 22 - 32 mmol/L   Glucose, Bld 104 (H) 70 - 99 mg/dL    Comment: Glucose reference range applies only to samples taken after fasting for at least 8 hours.   BUN 24  (H) 8 - 23 mg/dL   Creatinine, Ser 5.28 (H) 0.44 - 1.00 mg/dL   Calcium 8.2 (L) 8.9 - 10.3 mg/dL   Total Protein 5.9 (L) 6.5 - 8.1 g/dL   Albumin 2.4 (L) 3.5 - 5.0 g/dL   AST 35 15 - 41 U/L   ALT 28 0 - 44 U/L   Alkaline Phosphatase 108 38 - 126 U/L   Total Bilirubin 1.9 (H) 0.3 - 1.2 mg/dL   GFR, Estimated 55 (L) >60 mL/min    Comment: (NOTE) Calculated using the CKD-EPI Creatinine Equation (2021)    Anion gap 7 5 - 15    Comment: Performed at [facility], [address] [place], [place] [address]  CBC     Status: Abnormal   Collection Time: 10/17/22 12:05 PM  Result Value Ref Range   WBC 5.5 4.0 - 10.5 K/uL   RBC 2.85 (L) 3.87 - 5.11 MIL/uL   Hemoglobin 8.4 (L) 12.0 - 15.0 g/dL   HCT 19.1 (L) 47.8 - 29.5 %   MCV 95.8 80.0 - 100.0 fL   MCH 29.5 26.0 - 34.0 pg   MCHC 30.8 30.0 - 36.0 g/dL   RDW 62.1 (H) 30.8 - 65.7 %   Platelets 161 150 - 400 K/uL   nRBC 0.0 0.0 - 0.2 %    Comment: Performed at [facility], [address] [place], [place] [address]  Ammonia     Status: Abnormal   Collection Time: 10/17/22  2:25 PM  Result Value Ref Range   Ammonia 81 (H) 9 - 35 umol/L    Comment: AGE OF SPECIMEN MAY AFFECT INTEGRITY OF RESULTS Performed at [facility], [address], [place], [place] [address]   Blood gas, venous     Status: Abnormal   Collection Time: 10/17/22  2:25 PM  Result Value Ref Range   pH, Ven 7.41 7.25 - 7.43   pCO2, Ven 36 (L) 44 - 60 mmHg   pO2, Ven 37 32 - 45 mmHg   Bicarbonate 22.8 20.0 - 28.0 mmol/L   Acid-base deficit 1.4 0.0 - 2.0 mmol/L   O2 Saturation 60.1 %   Patient temperature 37.0     Comment: Performed at [facility], [address] [place], [place] [address]  Protime-INR     Status: Abnormal   Collection Time: 10/17/22  2:25 PM  Result Value Ref Range   Prothrombin Time 19.2 (H) 11.4 - 15.2 seconds   INR 1.6 (H) 0.8 - 1.2    Comment: (NOTE) INR goal varies  based on device and disease states. Performed at [facility], [address], [place], [place] [address]   TSH     Status: Abnormal   Collection Time: 10/17/22  2:25 PM  Result Value Ref Range   TSH 4.539 (H) 0.350 - 4.500 uIU/mL    Comment: Performed by a 3rd Generation assay with a functional sensitivity of <=0.01 uIU/mL. Performed at [facility], [address] [place], [place] [address]   T4, free     Status: Abnormal   Collection Time: 10/17/22  2:25 PM  Result Value Ref Range   Free T4 1.49 (H) 0.61 - 1.12 ng/dL    Comment: (NOTE) Biotin ingestion may interfere with free T4 tests. If the results are inconsistent with the TSH level, previous test results, or the clinical presentation, then consider biotin interference. If needed, order repeat testing after stopping biotin. Performed at [facility], [address], [place], [place] [address]   Urinalysis, Routine w reflex microscopic -Urine, Catheterized     Status: Abnormal   Collection Time: 10/17/22  4:58 PM  Result Value Ref Range   Color, Urine YELLOW YELLOW   APPearance HAZY (A) CLEAR   Specific Gravity, Urine 1.014 1.005 - 1.030   pH 6.0 5.0 - 8.0   Glucose, UA NEGATIVE NEGATIVE mg/dL   Hgb urine dipstick LARGE (A) NEGATIVE   Bilirubin Urine NEGATIVE NEGATIVE   Ketones, ur NEGATIVE NEGATIVE mg/dL   Protein, ur 30 (A) NEGATIVE mg/dL   Nitrite NEGATIVE NEGATIVE   Leukocytes,Ua LARGE (A) NEGATIVE   RBC / HPF  6-10 0 - 5 RBC/hpf   WBC, UA 21-50 0 - 5 WBC/hpf   Bacteria, UA RARE (A) NONE SEEN   Squamous Epithelial / HPF 0-5 0 - 5 /HPF   Mucus PRESENT    Hyaline Casts, UA PRESENT     Comment: Performed at [facility], [address], [place], [place] [address]  Type and screen     Status: None   Collection Time: 10/18/22 12:30 AM  Result Value Ref Range   ABO/RH(D) AB NEG    Antibody Screen NEG    Sample Expiration      10/21/2022,2359 Performed  at [facility], [address], [place], [place] [address]   CBC with Differential     Status: Abnormal   Collection Time: 10/18/22 12:31 AM  Result Value Ref Range   WBC 5.3 4.0 - 10.5 K/uL   RBC 2.60 (L) 3.87 - 5.11 MIL/uL   Hemoglobin 7.8 (L) 12.0 - 15.0 g/dL   HCT 29.5 (L) 62.1 - 30.8 %   MCV 95.0 80.0 - 100.0 fL   MCH 30.0 26.0 - 34.0 pg   MCHC 31.6 30.0 - 36.0 g/dL   RDW 65.7 84.6 - 96.2 %   Platelets 158 150 - 400 K/uL   nRBC 0.0 0.0 - 0.2 %   Neutrophils Relative % 51 %   Neutro Abs 2.7 1.7 - 7.7 K/uL   Lymphocytes Relative 31 %   Lymphs Abs 1.6 0.7 - 4.0 K/uL   Monocytes Relative 9 %   Monocytes Absolute 0.5 0.1 - 1.0 K/uL   Eosinophils Relative 8 %   Eosinophils Absolute 0.4 0.0 - 0.5 K/uL   Basophils Relative 1 %   Basophils Absolute 0.0 0.0 - 0.1 K/uL   Immature Granulocytes 0 %   Abs Immature Granulocytes 0.01 0.00 - 0.07 K/uL    Comment: Performed at [facility], [address] [place], [place] [address]  CBC     Status: Abnormal   Collection Time: 10/18/22  5:25 AM  Result Value Ref Range   WBC 5.5 4.0 - 10.5 K/uL   RBC 2.78 (L) 3.87 - 5.11 MIL/uL   Hemoglobin 8.3 (L) 12.0 - 15.0 g/dL   HCT 13.2 (L) 44.0 - 10.2 %   MCV 96.4 80.0 - 100.0 fL   MCH 29.9 26.0 - 34.0 pg   MCHC 31.0 30.0 - 36.0 g/dL   RDW 72.5 (H) 36.6 - 44.0 %   Platelets 157 150 - 400 K/uL   nRBC 0.0 0.0 - 0.2 %    Comment: Performed at [facility], [address] [place], [place] [address]  Comprehensive metabolic panel     Status: Abnormal   Collection Time: 10/18/22  5:25 AM  Result Value Ref Range   Sodium 144 135 - 145 mmol/L   Potassium 3.9 3.5 - 5.1 mmol/L   Chloride 112 (H) 98 - 111 mmol/L   CO2 19 (L) 22 - 32 mmol/L   Glucose, Bld 80 70 - 99 mg/dL    Comment: Glucose reference range applies only to samples taken after fasting for at least 8 hours.   BUN 21 8 - 23 mg/dL   Creatinine, Ser 5.95 0.44 - 1.00 mg/dL    Calcium 8.4 (L) 8.9 - 10.3 mg/dL   Total Protein 6.0 (L) 6.5 - 8.1 g/dL   Albumin 2.4 (L) 3.5 - 5.0 g/dL   AST 44 (H) 15 - 41 U/L   ALT  26 0 - 44 U/L   Alkaline Phosphatase 103 38 - 126 U/L   Total Bilirubin 2.2 (H) 0.3 - 1.2 mg/dL   GFR, Estimated >16 >10 mL/min    Comment: (NOTE) Calculated using the CKD-EPI Creatinine Equation (2021)    Anion gap 13 5 - 15    Comment: Performed at [facility], [address] [place], [place] [address]   MR BRAIN WO CONTRAST  Result Date: 10/17/2022 CLINICAL DATA:  Initial evaluation for mental status change, unknown cause. EXAM: MRI HEAD WITHOUT CONTRAST TECHNIQUE: Multiplanar, multiecho pulse sequences of the brain and surrounding structures were obtained without intravenous contrast. COMPARISON:  Prior CT from earlier the same day. FINDINGS: Brain: Examination moderately to severely degraded by motion artifact. Cerebral volume within normal limits. Patchy T2/FLAIR hyperintensity involving the periventricular and deep white matter, most likely root related to chronic microvascular ischemic disease, moderately advanced in nature. No visible foci of restricted diffusion to suggest acute or subacute ischemia. Gray-white matter differentiation maintained. No visible acute or chronic intracranial blood products. No visible mass lesion, midline shift or mass effect. No hydrocephalus or extra-axial fluid collection. Pituitary gland and suprasellar region within normal limits. Vascular: Major intracranial vascular flow voids are grossly maintained. Skull and upper cervical spine: Craniocervical junction within normal limits. Bone marrow signal intensity grossly normal. No scalp soft tissue abnormality. Sinuses/Orbits: Prior ocular lens replacement on the left. Paranasal sinuses are largely clear. No significant mastoid effusion. Other: None. IMPRESSION: 1. Motion degraded exam. 2. No definite acute intracranial abnormality. 3. Moderately advanced  chronic microvascular ischemic disease. Electronically Signed   By: Rise Mu M.D.   On: 10/17/2022 21:05   CT Head Wo Contrast  Result Date: 10/17/2022 CLINICAL DATA:  ams EXAM: CT HEAD WITHOUT CONTRAST TECHNIQUE: Contiguous axial images were obtained from the base of the skull through the vertex without intravenous contrast. RADIATION DOSE REDUCTION: This exam was performed according to the departmental dose-optimization program which includes automated exposure control, adjustment of the mA and/or kV according to patient size and/or use of iterative reconstruction technique. COMPARISON:  CT Head 09/30/22 FINDINGS: Brain: No hemorrhage. No hydrocephalus. No extra-axial fluid collection. No CT evidence of an acute cortical infarct. No mass effect. No mass lesion. There is sequela of mild chronic microvascular ischemic change. Apparent linear hypodensity through the right cerebellum is likely artifactual and related to streak artifact. Vascular: No hyperdense vessel or unexpected calcification. Skull: Normal. Negative for fracture or focal lesion. Sinuses/Orbits: No middle ear or mastoid effusion. Paranasal sinuses are clear. Left lens replacement. Orbits are otherwise unremarkable. Other: None. IMPRESSION: No acute intracranial abnormality. Apparent linear hypodensity through the right cerebellum is likely artifactual and related to streak artifact. If there is clinical concern for acute infarct, consider further evaluation with MRI. Electronically Signed   By: Lorenza Cambridge M.D.   On: 10/17/2022 14:06    Pending Labs Unresulted Labs (From admission, onward)     Start     Ordered   10/18/22 0645  Ammonia  Add-on,   AD        10/18/22 0644   10/17/22 1815  Culture, blood (Routine X 2) w Reflex to ID Panel  BLOOD CULTURE X 2,   R (with TIMED occurrences)      10/17/22 1814            Vitals/Pain Today's Vitals   10/18/22 0700 10/18/22 0805 10/18/22 1000 10/18/22 1024  BP: (!) 146/82   130/70   Pulse:  95  82   Resp: 17  17   Temp:    98.6 F (37 C)  TempSrc:    Oral  SpO2: 100%  100%   Weight:      Height:      PainSc:  Asleep      Isolation Precautions No active isolations  Medications Medications  dicyclomine (BENTYL) capsule 10 mg (has no administration in time range)  levothyroxine (SYNTHROID) tablet 150 mcg (150 mcg Oral Not Given 10/18/22 0865)  midodrine (PROAMATINE) tablet 10 mg (has no administration in time range)  rifaximin (XIFAXAN) tablet 550 mg (550 mg Oral Not Given 10/18/22 0210)  tamsulosin (FLOMAX) capsule 0.4 mg (0.4 mg Oral Not Given 10/18/22 0211)  venlafaxine XR (EFFEXOR-XR) 24 hr capsule 75 mg (has no administration in time range)  lactulose (CHRONULAC) 10 GM/15ML solution 20 g (20 g Oral Not Given 10/18/22 0211)  megestrol (MEGACE) tablet 40 mg (40 mg Oral Not Given 10/18/22 0211)  octreotide (SANDOSTATIN) 2 mcg/mL load via infusion 50 mcg (50 mcg Intravenous Bolus from Bag 10/18/22 0129)    And  octreotide (SANDOSTATIN) 500 mcg in sodium chloride 0.9 % 250 mL (2 mcg/mL) infusion (0 mcg/hr Intravenous Stopped 10/18/22 1051)  pantoprazole (PROTONIX) injection 40 mg (40 mg Intravenous Given 10/18/22 0812)  lactulose (CHRONULAC) 10 GM/15ML solution 30 g (30 g Oral Given 10/17/22 1812)  metoCLOPramide (REGLAN) injection 10 mg (10 mg Intravenous Given 10/18/22 0036)  sodium chloride 0.9 % bolus 1,000 mL (0 mLs Intravenous Stopped 10/18/22 1051)  cefTRIAXone (ROCEPHIN) 1 g in sodium chloride 0.9 % 100 mL IVPB (0 g Intravenous Stopped 10/18/22 0145)  prochlorperazine (COMPAZINE) injection 10 mg (10 mg Intravenous Given 10/18/22 0520)    Mobility non-ambulatory

## 2022-10-19 ENCOUNTER — Encounter (HOSPITAL_COMMUNITY)
Admission: EM | Disposition: A | Discharge status: Home Health | Payer: Self-pay | Source: Home / Self Care | Attending: Internal Medicine

## 2022-10-19 ENCOUNTER — Inpatient Hospital Stay (HOSPITAL_COMMUNITY): Payer: Medicare (Managed Care) | Admitting: Registered Nurse

## 2022-10-19 ENCOUNTER — Encounter (HOSPITAL_COMMUNITY): Payer: Self-pay | Admitting: Internal Medicine

## 2022-10-19 DIAGNOSIS — K7030 Alcoholic cirrhosis of liver without ascites: Secondary | ICD-10-CM

## 2022-10-19 DIAGNOSIS — E039 Hypothyroidism, unspecified: Secondary | ICD-10-CM | POA: Diagnosis not present

## 2022-10-19 DIAGNOSIS — K766 Portal hypertension: Secondary | ICD-10-CM

## 2022-10-19 DIAGNOSIS — D500 Iron deficiency anemia secondary to blood loss (chronic): Secondary | ICD-10-CM | POA: Diagnosis not present

## 2022-10-19 DIAGNOSIS — K7682 Hepatic encephalopathy: Secondary | ICD-10-CM | POA: Diagnosis not present

## 2022-10-19 DIAGNOSIS — K3189 Other diseases of stomach and duodenum: Secondary | ICD-10-CM | POA: Diagnosis not present

## 2022-10-19 HISTORY — PX: ESOPHAGOGASTRODUODENOSCOPY (EGD) WITH PROPOFOL: SHX5813

## 2022-10-19 LAB — BLOOD CULTURE ID PANEL (REFLEXED) - BCID2

## 2022-10-19 SURGERY — ESOPHAGOGASTRODUODENOSCOPY (EGD) WITH PROPOFOL
Anesthesia: Monitor Anesthesia Care

## 2022-10-19 MED ORDER — SODIUM CHLORIDE 0.9 % IV SOLN: INTRAVENOUS | Status: DC

## 2022-10-19 MED ORDER — PROPOFOL 500 MG/50ML IV EMUL
INTRAVENOUS | Status: DC | PRN
  Administered 2022-10-19: 130 ug/kg/min via INTRAVENOUS

## 2022-10-19 MED ORDER — SPIRONOLACTONE 25 MG PO TABS
25.0000 mg | ORAL_TABLET | Freq: Every day | ORAL | Status: DC
  Administered 2022-10-19 – 2022-10-30 (×12): 25 mg via ORAL
  Filled 2022-10-19 (×12): qty 1

## 2022-10-19 MED ORDER — FUROSEMIDE 40 MG PO TABS
40.0000 mg | ORAL_TABLET | Freq: Every day | ORAL | Status: DC
  Administered 2022-10-19 – 2022-10-27 (×9): 40 mg via ORAL
  Filled 2022-10-19 (×9): qty 1

## 2022-10-19 MED ORDER — PANTOPRAZOLE SODIUM 40 MG IV SOLR
40.0000 mg | INTRAVENOUS | Status: DC
  Administered 2022-10-20 – 2022-10-21 (×2): 40 mg via INTRAVENOUS
  Filled 2022-10-19 (×2): qty 10

## 2022-10-19 MED ORDER — PROPOFOL 1000 MG/100ML IV EMUL
INTRAVENOUS | Status: AC
  Filled 2022-10-19: qty 100

## 2022-10-19 MED ORDER — PROCHLORPERAZINE EDISYLATE 10 MG/2ML IJ SOLN: 5.0000 mg | Freq: Once | INTRAMUSCULAR | Status: DC

## 2022-10-19 MED ORDER — EPHEDRINE SULFATE-NACL 50-0.9 MG/10ML-% IV SOSY
PREFILLED_SYRINGE | INTRAVENOUS | Status: DC | PRN
  Administered 2022-10-19: 5 mg via INTRAVENOUS

## 2022-10-19 SURGICAL SUPPLY — 15 items

## 2022-10-19 NOTE — Op Note (Signed)
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 Estimated Blood Loss:     Estimated blood loss: none. Procedure:                Pre-Anesthesia Assessment:                           - Prior to the procedure, a History and Physical                            was performed, and patient medications and                            allergies were reviewed. The patient's tolerance of                            previous anesthesia was also reviewed. The risks                            and benefits of the procedure and the sedation                            options and risks were discussed with the patient.                            All questions were answered, and informed consent                            was obtained. Prior Anticoagulants: The patient has                            taken no anticoagulant or antiplatelet agents. ASA                            Grade Assessment: III - A patient with severe                            systemic  disease. After reviewing the risks and                            benefits, the patient was deemed in satisfactory                            condition to undergo the procedure.                           After obtaining informed consent, the endoscope was                            passed under direct vision. Throughout the                            procedure, the patient's  blood pressure, pulse, and                            oxygen saturations were monitored continuously. The                            [id number]-[id number] ([id number] Olympus endoscope was introduced                            through the mouth, and advanced to the second part                            of duodenum. The upper GI endoscopy was                            accomplished without difficulty. The patient                            tolerated the procedure well. Scope In: Scope Out: Findings:      The examined esophagus was normal.      Patchy mildly friable mucosa with contact bleeding was found in the       cardia, in the gastric fundus and in the gastric body.      The cardia and gastric fundus were normal on retroflexion.      Mild portal hypertensive gastropathy was found in the entire examined       stomach.      The examined duodenum was normal. Impression:               - Normal esophagus.                           - Friable gastric mucosa.                           - Portal hypertensive gastropathy.                           - Normal examined duodenum.                           -  No specimens collected. Moderate Sedation:      Patient did not receive moderate sedation for this procedure, but       instead received monitored anesthesia care. Recommendation:           - Resume regular diet.                           - Continue present medications. Procedure Code(s):        --- Professional ---                           [phone], Esophagogastroduodenoscopy, flexible,                            transoral; diagnostic, including collection of                            specimen(s) by brushing or washing, when performed                            (separate procedure) Diagnosis Code(s):        --- Professional ---                           K31.89, Other diseases of stomach and duodenum                           K76.6, Portal hypertension                           K92.0, Hematemesis                            K74.60, Unspecified cirrhosis of liver CPT copyright 2022 [employer]. All rights reserved. The codes documented in this report are preliminary and upon coder review may  be revised to meet current compliance requirements. Kerin Salen, MD 10/19/2022 2:16:50 PM This report has been signed electronically. Number of Addenda: 0

## 2022-10-19 NOTE — Interval H&P Note (Signed)
History and Physical Interval Note: 67/female with cirrhosis with hematemesis for EGD with possible banding.  10/19/2022 1:53 PM  [name]  has presented today for EGD with possible banding with the diagnosis of hematemesis.  The various methods of treatment have been discussed with the patient and family. After consideration of risks, benefits and other options for treatment, the patient has consented to  Procedure(s): ESOPHAGOGASTRODUODENOSCOPY (EGD) WITH PROPOFOL (N/A) as a surgical intervention.  The patient's history has been reviewed, patient examined, no change in status, stable for surgery.  I have reviewed the patient's chart and labs.  Questions were answered to the patient's satisfaction.     Kerin Salen

## 2022-10-19 NOTE — Anesthesia Postprocedure Evaluation (Signed)
Anesthesia Post Note  Patient: [name]  Procedure(s) Performed: ESOPHAGOGASTRODUODENOSCOPY (EGD) WITH PROPOFOL     Patient location during evaluation: Endoscopy Anesthesia Type: MAC Level of consciousness: awake Pain management: pain level controlled Vital Signs Assessment: post-procedure vital signs reviewed and stable Respiratory status: spontaneous breathing, nonlabored ventilation and respiratory function stable Cardiovascular status: blood pressure returned to baseline and stable Postop Assessment: no apparent nausea or vomiting Anesthetic complications: no   No notable events documented.  Last Vitals:  Vitals:   10/19/22 1430 10/19/22 1436  BP: (!) 112/50 (!) 121/51  Pulse: 85 81  Resp: 20 (!) 23  Temp:    SpO2: 100% 98%    Last Pain:  Vitals:   10/19/22 1436  TempSrc:   PainSc: 0-No pain                 Sajid Ruppert P Rin Gorton

## 2022-10-19 NOTE — Progress Notes (Signed)
[facility]                                                                              [name], is a 67 y.o. female, DOB - 13-Aug-1955, ZOX:[id number] Admit date - 10/17/2022    Outpatient Primary MD for the patient is [name], MD  LOS - 2  days  Chief Complaint  Patient presents with   Weakness   Altered Mental Status       Brief summary   Patient is a 67 year old female with known alcoholic fatty liver disease, chronic diarrhea,(takes lactulose only if she does not move bm 3 to 4 times daily)  hypertension, obesity, PE, short gut syndrome, hypothyroidism, cholelithiasis, admitted with increasing confusion.  Patient lives at home with her son.  According to son she has been acting confused  Son noticed that she is more weak and confused than her baseline she had difficulty getting dressed and getting out of the house. Patient was recently admitted to the hospital 09/30/2022 discharged on 10/12/2022. Recent hospital admission she had sepsis secondary to Pseudomonas UTI and bacteremia.  Blood culture grew ESBL Pseudomonas treated with meropenem completed on 10/11/2022.  Follow-up blood cultures were negative.  She had vaginal bleeding she was started on Megace she was due to follow-up with gynecologist later this month.  Son tells me she was also is due to see infectious disease on 10/17/2022 but has not seen ended up in the hospital. Denies having sleep apnea.  Denies urinary complaints. She has some rash on the center of her chest which apparently is a cat scratch from her cat. Son reported she bites her tongue at times no history of seizures or incontinence reported ED Course: TSH 4.5 normal white count ammonia level 81 vital signs stable ABG unremarkable, CT head no acute intracranial abnormality.  Hypodensity through the right cerebellum artifact likely.   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  Hematemesis, concern for variceal bleeding -Continue octreotide.  GI following, diagnostic EGD today possible banding if needed -Continue PPI   Chronic hypotension -Continue midodrine  Hypothyroidism -Continue Synthroid   Vaginal bleeding -Continue Megace 40 mg twice daily, has appointment with GYN in 11/03/2022     CT scan with cerebellar infarct could not rule out acute stroke  -MRI brain showed no definite acute intracranial abnormality, moderately advanced chronic microvascular ischemic disease  Obesity Estimated body mass index is 39.32 kg/m as calculated from the following:   Height as of this encounter: 5\' 2"  (1.575 m).   Weight as of this encounter: 97.5 kg.  Code Status: DNR are DVT Prophylaxis:  SCDs Start: 10/17/22 1759   Level of Care: Level of care: Progressive Family Communication: Updated patient's son at the bedside Disposition Plan:      Remains inpatient appropriate:  Procedures:    Consultants:   GI  Antimicrobials:   Anti-infectives (From admission, onward)    Start     Dose/Rate Route Frequency Ordered Stop   10/18/22 0030  cefTRIAXone (ROCEPHIN) 1 g in sodium chloride 0.9 % 100 mL IVPB        1 g 200 mL/hr over 30 Minutes Intravenous  Once 10/18/22 0022 10/18/22 0145   10/17/22 2200  rifaximin (XIFAXAN) tablet 550 mg        550 mg Oral 2 times daily 10/17/22 1802            Medications  lactulose  20 g Oral TID   levothyroxine  150 mcg Oral QAC breakfast   megestrol  40 mg Oral BID   midodrine  10 mg Oral TID WC   pantoprazole (PROTONIX) IV  40 mg Intravenous Q12H   prochlorperazine  5 mg Intravenous Once   rifaximin  550 mg Oral  BID   tamsulosin  0.4 mg Oral Daily   venlafaxine XR  75 mg Oral Q breakfast      Subjective:   [name] was seen and examined today.   Patient denies dizziness, chest pain, shortness of breath, abdominal pain.  No acute events overnight.   Per son, she bit her tongue and had some dried blood from the tongue.  She also had vaginal bleeding this morning.  Objective:   Vitals:   10/18/22 1446 10/18/22 2025 10/19/22 0042 10/19/22 0532  BP: 139/66 123/65 120/67 127/64  Pulse: 86 85 77 83  Resp: 16 18 15 16   Temp: 98.6 F (37 C) 98.2 F (36.8 C) 98.7 F (37.1 C) 98.8 F (37.1 C)  TempSrc: Oral Oral Oral Oral  SpO2: 100% 100% 100% 98%  Weight:      Height:        Intake/Output Summary (Last 24 hours) at 10/19/2022 1249 Last data filed at 10/19/2022 0900 Gross per 24 hour  Intake 431.51 ml  Output --  Net 431.51 ml     Wt Readings from Last 3 Encounters:  10/17/22 97.5 kg  10/10/22 100.7 kg  09/06/22 (!) 144.3 kg     Exam General: Alert and oriented x 3, NAD Cardiovascular: S1 S2 auscultated,  RRR Respiratory: Clear to auscultation bilaterally Gastrointestinal: Soft, nontender, nondistended, + bowel sounds Ext: no pedal edema bilaterally Neuro: no new deficits  Skin: No rashes Psych: Normal affect     Data Reviewed:  I have personally reviewed following labs    CBC Lab Results  Component Value Date   WBC 5.5 10/18/2022   RBC 2.78 (L) 10/18/2022   HGB 8.3 (L) 10/18/2022   HCT 26.8 (L) 10/18/2022   MCV 96.4 10/18/2022   MCH 29.9 10/18/2022   PLT 157 10/18/2022   MCHC 31.0 10/18/2022   RDW 15.6 (H) 10/18/2022   LYMPHSABS 1.6 10/18/2022   MONOABS 0.5 10/18/2022   EOSABS 0.4 10/18/2022   BASOSABS 0.0 10/18/2022     Last metabolic panel Lab Results  Component Value Date   NA 144 10/18/2022   K 3.9 10/18/2022   CL 112 (H) 10/18/2022   CO2 19 (L) 10/18/2022   BUN 21 10/18/2022   CREATININE 0.73 10/18/2022   GLUCOSE 80 10/18/2022    GFRNONAA >60 10/18/2022   GFRAA >60 01/19/2019   CALCIUM 8.4 (L) 10/18/2022   PHOS 2.9 10/05/2022   PROT 6.0 (L) 10/18/2022   ALBUMIN 2.4 (L) 10/18/2022   BILITOT 2.2 (H) 10/18/2022   ALKPHOS 103  10/18/2022   AST 44 (H) 10/18/2022   ALT 26 10/18/2022   ANIONGAP 13 10/18/2022    CBG (last 3)  Recent Labs    10/17/22 1118  GLUCAP 99      Coagulation Profile: Recent Labs  Lab 10/17/22 1425  INR 1.6*     Radiology Studies: I have personally reviewed the imaging studies  MR BRAIN WO CONTRAST  Result Date: 10/17/2022 CLINICAL DATA:  Initial evaluation for mental status change, unknown cause. EXAM: MRI HEAD WITHOUT CONTRAST TECHNIQUE: Multiplanar, multiecho pulse sequences of the brain and surrounding structures were obtained without intravenous contrast. COMPARISON:  Prior CT from earlier the same day. FINDINGS: Brain: Examination moderately to severely degraded by motion artifact. Cerebral volume within normal limits. Patchy T2/FLAIR hyperintensity involving the periventricular and deep white matter, most likely root related to chronic microvascular ischemic disease, moderately advanced in nature. No visible foci of restricted diffusion to suggest acute or subacute ischemia. Gray-white matter differentiation maintained. No visible acute or chronic intracranial blood products. No visible mass lesion, midline shift or mass effect. No hydrocephalus or extra-axial fluid collection. Pituitary gland and suprasellar region within normal limits. Vascular: Major intracranial vascular flow voids are grossly maintained. Skull and upper cervical spine: Craniocervical junction within normal limits. Bone marrow signal intensity grossly normal. No scalp soft tissue abnormality. Sinuses/Orbits: Prior ocular lens replacement on the left. Paranasal sinuses are largely clear. No significant mastoid effusion. Other: None. IMPRESSION: 1. Motion degraded exam. 2. No definite acute intracranial abnormality. 3.  Moderately advanced chronic microvascular ischemic disease. Electronically Signed   By: Rise Mu M.D.   On: 10/17/2022 21:05       Minoru Chap M.D. [facility] 10/19/2022, 12:49 PM  Available via Epic secure chat 7am-7pm After 7 pm, please refer to night coverage provider listed on amion.

## 2022-10-19 NOTE — Plan of Care (Signed)
  Problem: Clinical Measurements: Goal: Signs and symptoms of infection will decrease Outcome: Progressing   Problem: Clinical Measurements: Goal: Will remain free from infection Outcome: Progressing   Problem: Elimination: Goal: Will not experience complications related to bowel motility Outcome: Progressing   Problem: Safety: Goal: Ability to remain free from injury will improve Outcome: Progressing

## 2022-10-19 NOTE — TOC Initial Note (Addendum)
Transition of Care [facility]) - Initial/Assessment Note    Patient Details  Name: [name] MRN: [id number] Date of Birth: [birth year]  Transition of Care [facility]) CM/SW Contact:    [name], RN Phone Number: 10/19/2022, 10:14 AM  Clinical Narrative:  Met with pt and son at bedside to introduce role of TOC/NCM and review for dc planning. Pt reports she has an established PCP and pharmacy, reports pt is currently receiving HH PT through [facility]. Home DME: wheelchair, rollators, BSC, hospital bed, shower chair. Pt resides with her son who is her primary caregiver. TOC will continue to follow.       -11:10am  Per [name] w/[facility], pt no longer active on service             Expected Discharge Plan: Home w Home Health Services Barriers to Discharge: Continued Medical Work up   Patient Goals and CMS Choice Patient states their goals for this hospitalization and ongoing recovery are:: return home with home health          Expected Discharge Plan and Services       Living arrangements for the past 2 months: Single Family Home                                      Prior Living Arrangements/Services Living arrangements for the past 2 months: Single Family Home Lives with:: Adult Children Patient language and need for interpreter reviewed:: Yes Do you feel safe going back to the place where you live?: Yes      Need for Family Participation in Patient Care: Yes (Comment) Care giver support system in place?: Yes (comment) Current home services: DME, Home PT (DME: hospital bed, BSC, Wheelchair, Rollator, [employer], shower chair . HH PT through [facility]) Criminal Activity/Legal Involvement Pertinent to Current Situation/Hospitalization: No - Comment as needed  Activities of Daily Living   ADL Screening (condition at time of admission) Independently performs ADLs?: Yes (appropriate for developmental age) Is the patient deaf or have difficulty hearing?: No Does the  patient have difficulty seeing, even when wearing glasses/contacts?: Yes Does the patient have difficulty concentrating, remembering, or making decisions?: Yes  Permission Sought/Granted Permission sought to share information with : Case Manager Permission granted to share information with : Yes, Verbal Permission Granted  Share Information with NAME: Fannie Knee, RN           Emotional Assessment Appearance:: Appears older than stated age Attitude/Demeanor/Rapport: Gracious Affect (typically observed): Accepting Orientation: : Oriented to Self, Oriented to Place, Oriented to  Time, Oriented to Situation Alcohol / Substance Use: Not Applicable Psych Involvement: No (comment)  Admission diagnosis:  Hepatic encephalopathy (HCC) [K76.82] Altered mental status, unspecified altered mental status type [R41.82] Patient Active Problem List   Diagnosis Date Noted   Hepatic encephalopathy (HCC) 10/17/2022   Hypomagnesemia 10/08/2022   DNR (do not resuscitate) 10/05/2022   Vaginal bleeding 10/05/2022   Septicemia due to Pseudomonas (HCC) - ESBL 10/01/2022   Severe sepsis (HCC) 09/30/2022   Hyperammonemia (HCC) 09/30/2022   Physical deconditioning 08/17/2022   Goals of care, counseling/discussion 08/17/2022   Decompensated hepatic cirrhosis (HCC) 08/17/2022   Hypotension 08/11/2022   Folate deficiency 06/14/2022   Acute metabolic encephalopathy 06/13/2022   Systolic murmur 06/12/2022   Iron deficiency anemia 05/16/2022   Thrombocytopenia (HCC) 05/16/2022   History of eye surgery 05/01/2022   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  Corneal scarring 09/10/2020   Cerebral atherosclerosis 08/20/2020   Short gut syndrome 08/20/2020   Abdominal wall hernia 03/06/2020   Splenomegaly 02/06/2019   Vitamin B12 deficiency 01/21/2019   Depression, major, recurrent, moderate (HCC) 09/10/2018   Hyperuricemia 08/25/2016   GERD (gastroesophageal reflux disease) 07/25/2016   Hypothyroidism  07/25/2016   Morbid obesity (HCC) 07/25/2014   Chronic diarrhea 06/01/2014   Hypokalemia 06/01/2014   Pulmonary emboli (HCC) 01/10/2009   DVT (deep venous thrombosis) (HCC) 01/10/1997   PCP:  [name], MD Pharmacy:   [id number] - [place], [place] - [address] [place] [address] Phone: [phone] Fax: [phone]  [id number],  - [address] AT [facility] [address] [place] [address] Phone: [phone] Fax: [phone]     Social Determinants of Health (SDOH) Social History: SDOH Screenings   Food Insecurity: No Food Insecurity (10/18/2022)  Housing: Low Risk  (10/18/2022)  Transportation Needs: No Transportation Needs (10/18/2022)  Utilities: Not At Risk (10/18/2022)  Tobacco Use: Medium Risk (10/17/2022)   SDOH Interventions:     Readmission Risk Interventions    10/19/2022   10:12 AM 08/15/2022    3:20 PM 05/18/2022   10:24 AM  Readmission Risk Prevention Plan  Transportation Screening Complete Complete Complete  PCP or Specialist Appt within 5-7 Days   Complete  Home Care Screening   Complete  Medication Review (RN CM)   Complete  Medication Review (RN Care Manager) Complete Complete   PCP or Specialist appointment within 3-5 days of discharge Complete Complete   HRI or Home Care Consult Complete Complete   SW Recovery Care/Counseling Consult Complete Complete   Palliative Care Screening Not Applicable Not Applicable   Skilled Nursing Facility Not Applicable Not Applicable

## 2022-10-19 NOTE — Progress Notes (Signed)
PHARMACY - PHYSICIAN COMMUNICATION CRITICAL VALUE ALERT - BLOOD CULTURE IDENTIFICATION (BCID)  [name] is an 67 y.o. female who presented to [facility] on 10/17/2022 with a chief complaint of weakness and altered mental status  Assessment:  10/8 Bcx: 1/4 staph epi, methicillin resistant Afebrile, WBC normal  Name of physician (or Provider) Contacted: Dr. Isidoro Donning  Current antibiotics: none  Changes to prescribed antibiotics recommended: none  Results for orders placed or performed during the hospital encounter of 10/17/22  Blood Culture ID Panel (Reflexed) (Collected: 10/18/2022  3:08 PM)  Result Value Ref Range   Enterococcus faecalis NOT DETECTED NOT DETECTED   Enterococcus Faecium NOT DETECTED NOT DETECTED   Listeria monocytogenes NOT DETECTED NOT DETECTED   Staphylococcus species DETECTED (A) NOT DETECTED   Staphylococcus aureus (BCID) NOT DETECTED NOT DETECTED   Staphylococcus epidermidis DETECTED (A) NOT DETECTED   Staphylococcus lugdunensis NOT DETECTED NOT DETECTED   Streptococcus species NOT DETECTED NOT DETECTED   Streptococcus agalactiae NOT DETECTED NOT DETECTED   Streptococcus pneumoniae NOT DETECTED NOT DETECTED   Streptococcus pyogenes NOT DETECTED NOT DETECTED   A.calcoaceticus-baumannii NOT DETECTED NOT DETECTED   Bacteroides fragilis NOT DETECTED NOT DETECTED   Enterobacterales NOT DETECTED NOT DETECTED   Enterobacter cloacae complex NOT DETECTED NOT DETECTED   Escherichia coli NOT DETECTED NOT DETECTED   Klebsiella aerogenes NOT DETECTED NOT DETECTED   Klebsiella oxytoca NOT DETECTED NOT DETECTED   Klebsiella pneumoniae NOT DETECTED NOT DETECTED   Proteus species NOT DETECTED NOT DETECTED   Salmonella species NOT DETECTED NOT DETECTED   Serratia marcescens NOT DETECTED NOT DETECTED   Haemophilus influenzae NOT DETECTED NOT DETECTED   Neisseria meningitidis NOT DETECTED NOT DETECTED   Pseudomonas aeruginosa NOT DETECTED NOT DETECTED   Stenotrophomonas  maltophilia NOT DETECTED NOT DETECTED   Candida albicans NOT DETECTED NOT DETECTED   Candida auris NOT DETECTED NOT DETECTED   Candida glabrata NOT DETECTED NOT DETECTED   Candida krusei NOT DETECTED NOT DETECTED   Candida parapsilosis NOT DETECTED NOT DETECTED   Candida tropicalis NOT DETECTED NOT DETECTED   Cryptococcus neoformans/gattii NOT DETECTED NOT DETECTED   Methicillin resistance mecA/C DETECTED (A) NOT DETECTED    Pricilla Riffle, PharmD, BCPS Clinical Pharmacist 10/19/2022 3:10 PM

## 2022-10-19 NOTE — Transfer of Care (Signed)
Immediate Anesthesia Transfer of Care Note  Patient: [name]  Procedure(s) Performed: ESOPHAGOGASTRODUODENOSCOPY (EGD) WITH PROPOFOL  Patient Location: PACU and Endoscopy Unit  Anesthesia Type:MAC  Level of Consciousness: awake, alert , oriented, and patient cooperative  Airway & Oxygen Therapy: Patient Spontanous Breathing and Patient connected to face mask oxygen  Post-op Assessment: Report given to RN, Post -op Vital signs reviewed and stable, and Patient moving all extremities  Post vital signs: Reviewed and stable  Last Vitals:  Vitals Value Taken Time  BP 98/48 10/19/22 1418  Temp 36.6 C 10/19/22 1418  Pulse 87 10/19/22 1419  Resp 18 10/19/22 1419  SpO2 100 % 10/19/22 1419  Vitals shown include unfiled device data.  Last Pain:  Vitals:   10/19/22 1259  TempSrc: Temporal  PainSc: 0-No pain         Complications: No notable events documented.

## 2022-10-19 NOTE — Anesthesia Preprocedure Evaluation (Addendum)
Anesthesia Evaluation  Patient identified by MRN, date of birth, ID band Patient awake    Reviewed: Allergy & Precautions, NPO status , Patient's Chart, lab work & pertinent test results  Airway Mallampati: II  TM Distance: >3 FB Neck ROM: Full    Dental  (+) Chipped,    Pulmonary former smoker, PE   Pulmonary exam normal        Cardiovascular hypertension, + DVT  Normal cardiovascular exam+ Valvular Problems/Murmurs AS   ECHO: 1. Left ventricular ejection fraction, by estimation, is 60 to 65%. The  left ventricle has normal function. The left ventricle has no regional  wall motion abnormalities. There is mild asymmetric left ventricular  hypertrophy of the basal-septal segment.  Left ventricular diastolic parameters are consistent with Grade I  diastolic dysfunction (impaired relaxation).   2. Right ventricular systolic function is normal. The right ventricular  size is normal.   3. The mitral valve is normal in structure. Trivial mitral valve  regurgitation. No evidence of mitral stenosis.   4. The aortic valve is tricuspid. There is moderate calcification of the  aortic valve. Aortic valve regurgitation is not visualized. Moderate  aortic valve stenosis.     Neuro/Psych  PSYCHIATRIC DISORDERS  Depression    negative neurological ROS     GI/Hepatic ,GERD  Medicated and Controlled,,(+) Cirrhosis         Endo/Other  Hypothyroidism    Renal/GU Renal disease     Musculoskeletal negative musculoskeletal ROS (+)    Abdominal  (+) + obese  Peds  Hematology  (+) Blood dyscrasia, anemia   Anesthesia Other Findings hematemesis  Reproductive/Obstetrics                             Anesthesia Physical Anesthesia Plan  ASA: 3  Anesthesia Plan: MAC   Post-op Pain Management:    Induction: Intravenous  PONV Risk Score and Plan: 2 and Propofol infusion and Treatment may vary due to age  or medical condition  Airway Management Planned: Nasal Cannula  Additional Equipment:   Intra-op Plan:   Post-operative Plan:   Informed Consent: I have reviewed the patients History and Physical, chart, labs and discussed the procedure including the risks, benefits and alternatives for the proposed anesthesia with the patient or authorized representative who has indicated his/her understanding and acceptance.   Patient has DNR.  Discussed DNR with patient and Suspend DNR.   Dental advisory given  Plan Discussed with: CRNA  Anesthesia Plan Comments:         Anesthesia Quick Evaluation

## 2022-10-19 NOTE — Anesthesia Procedure Notes (Signed)
Procedure Name: MAC Date/Time: 10/19/2022 1:55 PM  Performed by: Elisabeth Cara, CRNAPre-anesthesia Checklist: Patient identified, Emergency Drugs available, Suction available and Patient being monitored Patient Re-evaluated:Patient Re-evaluated prior to induction Oxygen Delivery Method: Simple face mask Placement Confirmation: positive ETCO2 Dental Injury: Teeth and Oropharynx as per pre-operative assessment

## 2022-10-20 ENCOUNTER — Inpatient Hospital Stay (HOSPITAL_COMMUNITY): Payer: Medicare (Managed Care)

## 2022-10-20 DIAGNOSIS — D508 Other iron deficiency anemias: Secondary | ICD-10-CM

## 2022-10-20 DIAGNOSIS — K7460 Unspecified cirrhosis of liver: Secondary | ICD-10-CM | POA: Diagnosis not present

## 2022-10-20 DIAGNOSIS — K7682 Hepatic encephalopathy: Secondary | ICD-10-CM | POA: Diagnosis not present

## 2022-10-20 DIAGNOSIS — R4182 Altered mental status, unspecified: Secondary | ICD-10-CM | POA: Diagnosis not present

## 2022-10-20 LAB — BASIC METABOLIC PANEL
Anion gap: 8 (ref 5–15)
BUN: 17 mg/dL (ref 8–23)
CO2: 17 mmol/L — ABNORMAL LOW (ref 22–32)
Calcium: 7.9 mg/dL — ABNORMAL LOW (ref 8.9–10.3)
Chloride: 112 mmol/L — ABNORMAL HIGH (ref 98–111)
Creatinine, Ser: 0.96 mg/dL (ref 0.44–1.00)
GFR, Estimated: >60 mL/min (ref >60)
Glucose, Bld: 123 mg/dL — ABNORMAL HIGH (ref 70–99)
Potassium: 3.3 mmol/L — ABNORMAL LOW (ref 3.5–5.1)
Sodium: 137 mmol/L (ref 135–145)

## 2022-10-20 LAB — CULTURE, BLOOD (ROUTINE X 2)

## 2022-10-20 LAB — CBC
HCT: 25.7 % — ABNORMAL LOW (ref 36.0–46.0)
Hemoglobin: 7.5 g/dL — ABNORMAL LOW (ref 12.0–15.0)
MCH: 29.6 pg (ref 26.0–34.0)
MCHC: 29.2 g/dL — ABNORMAL LOW (ref 30.0–36.0)
MCV: 101.6 fL — ABNORMAL HIGH (ref 80.0–100.0)
Platelets: 143 10*3/uL — ABNORMAL LOW (ref 150–400)
RBC: 2.53 MIL/uL — ABNORMAL LOW (ref 3.87–5.11)
RDW: 15.6 % — ABNORMAL HIGH (ref 11.5–15.5)
WBC: 5.6 10*3/uL (ref 4.0–10.5)
nRBC: 0 % (ref 0.0–0.2)

## 2022-10-20 LAB — AMMONIA: Ammonia: 48 umol/L — ABNORMAL HIGH (ref 9–35)

## 2022-10-20 LAB — PREPARE RBC (CROSSMATCH)

## 2022-10-20 MED ORDER — OXYCODONE HCL 5 MG PO TABS
5.0000 mg | ORAL_TABLET | Freq: Four times a day (QID) | ORAL | Status: DC | PRN
  Administered 2022-10-20 – 2022-10-25 (×10): 5 mg via ORAL
  Filled 2022-10-20 (×10): qty 1

## 2022-10-20 MED ORDER — ROPINIROLE HCL 0.25 MG PO TABS
0.2500 mg | ORAL_TABLET | Freq: Every day | ORAL | Status: DC
  Administered 2022-10-20 – 2022-10-30 (×11): 0.25 mg via ORAL
  Filled 2022-10-20 (×11): qty 1

## 2022-10-20 MED ORDER — METHOCARBAMOL 500 MG PO TABS
500.0000 mg | ORAL_TABLET | Freq: Three times a day (TID) | ORAL | Status: DC | PRN
  Administered 2022-10-20 – 2022-10-28 (×11): 500 mg via ORAL
  Filled 2022-10-20 (×11): qty 1

## 2022-10-20 MED ORDER — SODIUM CHLORIDE 0.9% IV SOLUTION: Freq: Once | INTRAVENOUS | Status: AC

## 2022-10-20 MED ORDER — POTASSIUM CHLORIDE CRYS ER 10 MEQ PO TBCR
20.0000 meq | EXTENDED_RELEASE_TABLET | Freq: Every day | ORAL | Status: DC
  Administered 2022-10-21 – 2022-10-29 (×9): 20 meq via ORAL
  Filled 2022-10-20 (×10): qty 2

## 2022-10-20 MED ORDER — POTASSIUM CHLORIDE CRYS ER 20 MEQ PO TBCR
40.0000 meq | EXTENDED_RELEASE_TABLET | Freq: Once | ORAL | Status: AC
  Administered 2022-10-20: 40 meq via ORAL
  Filled 2022-10-20: qty 2

## 2022-10-20 NOTE — Progress Notes (Signed)
[facility]                                                                              [name], is a 67 y.o. female, DOB - 05-Sep-1955, ZOX:[id number] Admit date - 10/17/2022    Outpatient Primary MD for the patient is [name], MD  LOS - 3  days  Chief Complaint  Patient presents with   Weakness   Altered Mental Status       Brief summary   Patient is a 67 year old female with known alcoholic fatty liver disease, chronic diarrhea,(takes lactulose only if she does not move bm 3 to 4 times daily)  hypertension, obesity, PE, short gut syndrome, hypothyroidism, cholelithiasis, admitted with increasing confusion.  Patient lives at home with her son.  According to son she has been acting confused  Son noticed that she is more weak and confused than her baseline she had difficulty getting dressed and getting out of the house. Patient was recently admitted to the hospital 09/30/2022 discharged on 10/12/2022. Recent hospital admission she had sepsis secondary to Pseudomonas UTI and bacteremia.  Blood culture grew ESBL Pseudomonas treated with meropenem completed on 10/11/2022.  Follow-up blood cultures were negative.  She had vaginal bleeding she was started on Megace she was due to follow-up with gynecologist later this month.  Son tells me she was also is due to see infectious disease on 10/17/2022 but has not seen ended up in the hospital. Denies having sleep apnea.  Denies urinary complaints. She has some rash on the center of her chest which apparently is a cat scratch from her cat. Son reported she bites her tongue at times no history of seizures or incontinence reported ED Course: TSH 4.5 normal white count ammonia level 81 vital signs stable ABG unremarkable, CT head no acute intracranial abnormality.  Hypodensity through the right cerebellum artifact likely.   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  Hematemesis, concern for variceal bleeding -Continue octreotide, PPI.  -EGD on 10/9 showed normal esophagus, friable gastric mucosa, portal hypertensive gastropathy, normal duodenum.  No active bleeding -Patient's son did not think she had hematemesis but had dried blood from the bitten tongue day before  Chronic hypotension -Continue midodrine  Hypothyroidism -Continue Synthroid   Vaginal bleeding -Continue Megace 40 mg twice daily, has appointment with GYN in 11/03/2022    Chronic back pain -Patient had kyphoplasty T3 and T5 in 08/2022 -Noted patient is on Robaxin 500 mg daily, pain medications outpatient -Resume Robaxin, oxycodone     Obesity Estimated body mass index is 39.32 kg/m as calculated from the following:   Height as of this  encounter: 5\' 2"  (1.575 m).   Weight as of this encounter: 97.5 kg.  Code Status: DNR are DVT Prophylaxis:  SCDs Start: 10/17/22 1759   Level of Care: Level of care: Progressive Family Communication: Updated patient's son at the bedside Disposition Plan:      Remains inpatient appropriate:      Procedures:  EGD 10/9  Consultants:   GI  Antimicrobials:   Anti-infectives (From admission, onward)    Start     Dose/Rate Route Frequency Ordered Stop   10/18/22 0030  cefTRIAXone (ROCEPHIN) 1 g in sodium chloride 0.9 % 100 mL IVPB        1 g 200 mL/hr over 30 Minutes Intravenous  Once 10/18/22 0022 10/18/22 0145   10/17/22 2200  rifaximin  (XIFAXAN) tablet 550 mg        550 mg Oral 2 times daily 10/17/22 1802            Medications  sodium chloride   Intravenous Once   furosemide  40 mg Oral Daily   lactulose  20 g Oral TID   levothyroxine  150 mcg Oral QAC breakfast   megestrol  40 mg Oral BID   midodrine  10 mg Oral TID WC   pantoprazole (PROTONIX) IV  40 mg Intravenous Q24H   [START ON 10/21/2022] potassium chloride  20 mEq Oral Daily   prochlorperazine  5 mg Intravenous Once   rifaximin  550 mg Oral BID   rOPINIRole  0.25 mg Oral QHS   spironolactone  25 mg Oral Daily   tamsulosin  0.4 mg Oral Daily   venlafaxine XR  75 mg Oral Q breakfast      Subjective:   [name] was seen and examined today.  Seen this morning, no acute complaints.  Stated had 3 episodes of diarrhea during the day yesterday and 2 in the late evening.  Son at the bedside.  Vaginal bleeding improving after resuming Megace.  Still feels deconditioned.  Objective:   Vitals:   10/19/22 1436 10/19/22 2033 10/20/22 0420 10/20/22 1253  BP: (!) 121/51 (!) 131/56 118/62 120/62  Pulse: 81 74 83 78  Resp: (!) 23 18 18 18   Temp:  98.8 F (37.1 C) 97.8 F (36.6 C) 98.2 F (36.8 C)  TempSrc:  Oral Oral Oral  SpO2: 98% 99% 100% 97%  Weight:      Height:        Intake/Output Summary (Last 24 hours) at 10/20/2022 1427 Last data filed at 10/20/2022 1000 Gross per 24 hour  Intake 240 ml  Output --  Net 240 ml     Wt Readings from Last 3 Encounters:  10/17/22 97.5 kg  10/10/22 100.7 kg  09/06/22 (!) 144.3 kg   Physical Exam General: Alert and oriented x 3, NAD Cardiovascular: S1 S2 clear, RRR.  Respiratory: CTAB, no wheezing Gastrointestinal: Soft, nontender, nondistended, NBS Ext: no pedal edema bilaterally Neuro: no new deficits Psych: Normal affect, pleasant    Data Reviewed:  I have personally reviewed following labs    CBC Lab Results  Component Value Date   WBC 5.6 10/20/2022   RBC 2.53 (L) 10/20/2022    HGB 7.5 (L) 10/20/2022   HCT 25.7 (L) 10/20/2022   MCV 101.6 (H) 10/20/2022   MCH 29.6 10/20/2022   PLT 143 (L) 10/20/2022   MCHC 29.2 (L) 10/20/2022   RDW 15.6 (H) 10/20/2022   LYMPHSABS 1.6 10/18/2022   MONOABS 0.5 10/18/2022   EOSABS 0.4 10/18/2022  BASOSABS 0.0 10/18/2022     Last metabolic panel Lab Results  Component Value Date   NA 137 10/20/2022   K 3.3 (L) 10/20/2022   CL 112 (H) 10/20/2022   CO2 17 (L) 10/20/2022   BUN 17 10/20/2022   CREATININE 0.96 10/20/2022   GLUCOSE 123 (H) 10/20/2022   GFRNONAA >60 10/20/2022   GFRAA >60 01/19/2019   CALCIUM 7.9 (L) 10/20/2022   PHOS 2.9 10/05/2022   PROT 6.0 (L) 10/18/2022   ALBUMIN 2.4 (L) 10/18/2022   BILITOT 2.2 (H) 10/18/2022   ALKPHOS 103 10/18/2022   AST 44 (H) 10/18/2022   ALT 26 10/18/2022   ANIONGAP 8 10/20/2022    CBG (last 3)  No results for input(s): "GLUCAP" in the last 72 hours.     Coagulation Profile: Recent Labs  Lab 10/17/22 1425  INR 1.6*     Radiology Studies: I have personally reviewed the imaging studies  No results found.     Thad Ranger M.D. [facility] 10/20/2022, 2:27 PM  Available via Epic secure chat 7am-7pm After 7 pm, please refer to night coverage provider listed on amion.

## 2022-10-21 DIAGNOSIS — R4182 Altered mental status, unspecified: Secondary | ICD-10-CM | POA: Diagnosis not present

## 2022-10-21 DIAGNOSIS — K7460 Unspecified cirrhosis of liver: Secondary | ICD-10-CM | POA: Diagnosis not present

## 2022-10-21 DIAGNOSIS — K7682 Hepatic encephalopathy: Secondary | ICD-10-CM | POA: Diagnosis not present

## 2022-10-21 LAB — TYPE AND SCREEN
ABO/RH(D): AB NEG
Antibody Screen: NEGATIVE
Unit division: 0

## 2022-10-21 LAB — CBC
HCT: 27 % — ABNORMAL LOW (ref 36.0–46.0)
Hemoglobin: 8.1 g/dL — ABNORMAL LOW (ref 12.0–15.0)
MCH: 29.8 pg (ref 26.0–34.0)
MCHC: 30 g/dL (ref 30.0–36.0)
MCV: 99.3 fL (ref 80.0–100.0)
Platelets: 143 10*3/uL — ABNORMAL LOW (ref 150–400)
RBC: 2.72 MIL/uL — ABNORMAL LOW (ref 3.87–5.11)
RDW: 15.3 % (ref 11.5–15.5)
WBC: 5 10*3/uL (ref 4.0–10.5)
nRBC: 0 % (ref 0.0–0.2)

## 2022-10-21 LAB — BPAM RBC
Blood Product Expiration Date: 202411092359
ISSUE DATE / TIME: 202410101612
Unit Type and Rh: 600

## 2022-10-21 LAB — BASIC METABOLIC PANEL
Anion gap: 10 (ref 5–15)
BUN: 16 mg/dL (ref 8–23)
CO2: 18 mmol/L — ABNORMAL LOW (ref 22–32)
Calcium: 7.9 mg/dL — ABNORMAL LOW (ref 8.9–10.3)
Chloride: 111 mmol/L (ref 98–111)
Creatinine, Ser: 0.92 mg/dL (ref 0.44–1.00)
GFR, Estimated: >60 mL/min (ref >60)
Glucose, Bld: 122 mg/dL — ABNORMAL HIGH (ref 70–99)
Potassium: 3.8 mmol/L (ref 3.5–5.1)
Sodium: 139 mmol/L (ref 135–145)

## 2022-10-21 LAB — AMMONIA: Ammonia: 169 umol/L — ABNORMAL HIGH (ref 9–35)

## 2022-10-21 NOTE — Progress Notes (Signed)
[facility]                                                                              [name], is a 67 y.o. female, DOB - [birth year], WUJ:[id number] Admit date - 10/17/2022    Outpatient Primary MD for the patient is [name], MD  LOS - 4  days  Chief Complaint  Patient presents with   Weakness   Altered Mental Status       Brief summary   Patient is a 67 year old female with known alcoholic fatty liver disease, chronic diarrhea,(takes lactulose only if she does not move bm 3 to 4 times daily)  hypertension, obesity, PE, short gut syndrome, hypothyroidism, cholelithiasis, admitted with increasing confusion.  Patient lives at home with her son.  According to son she has been acting confused  Son noticed that she is more weak and confused than her baseline she had difficulty getting dressed and getting out of the house. Patient was recently admitted to the hospital 09/30/2022 discharged on 10/12/2022. Recent hospital admission she had sepsis secondary to Pseudomonas UTI and bacteremia.  Blood culture grew ESBL Pseudomonas treated with meropenem completed on 10/11/2022.  Follow-up blood cultures were negative.  She had vaginal bleeding she was started on Megace she was due to follow-up with gynecologist later this month.  Son tells me she was also is due to see infectious disease on 10/17/2022 but has not seen ended up in the hospital. Denies having sleep apnea.  Denies urinary complaints. She has some rash on the center of her chest which apparently is a cat scratch from her cat. Son reported she bites her tongue at times no history of seizures or incontinence reported ED Course: TSH 4.5 normal white count ammonia level 81 vital signs stable ABG unremarkable, CT head no acute intracranial abnormality.  Hypodensity through the right cerebellum artifact likely.   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  Patient had several BMs during the day yesterday.   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  Hematemesis, concern for variceal bleeding -Continue octreotide, PPI.  -EGD on 10/9 showed normal esophagus, friable gastric mucosa, portal hypertensive gastropathy, normal duodenum.  No active bleeding -Patient's son did not think she had hematemesis but had dried blood from the bitten tongue day before  Chronic hypotension -Continue midodrine  Hypothyroidism -Continue Synthroid   Vaginal bleeding -Continue Megace 40 mg twice daily, has appointment with GYN in 11/03/2022    Acute on chronic back pain -Patient had kyphoplasty T3 and T5 in 08/2022 -Continue robaxin, oxycodone -X-ray showed compression fracture in the lower thoracic  spine, tentatively T12 has progressed on from prior exam -Discussed with the patient, she reported improvement in her back pain after the prior kyphoplasty, IR evaluation requested     Obesity Estimated body mass index is 39.32 kg/m as calculated from the following:   Height as of this encounter: 5\' 2"  (1.575 m).   Weight as of this encounter: 97.5 kg.  Code Status: DNR are DVT Prophylaxis:  SCDs Start: 10/17/22 1759   Level of Care: Level of care: Progressive Family Communication: Updated patient's son at the bedside on a 10/10 Disposition Plan:      Remains inpatient appropriate:      Procedures:  EGD  10/9  Consultants:   GI  Antimicrobials:   Anti-infectives (From admission, onward)    Start     Dose/Rate Route Frequency Ordered Stop   10/18/22 0030  cefTRIAXone (ROCEPHIN) 1 g in sodium chloride 0.9 % 100 mL IVPB        1 g 200 mL/hr over 30 Minutes Intravenous  Once 10/18/22 0022 10/18/22 0145   10/17/22 2200  rifaximin (XIFAXAN) tablet 550 mg        550 mg Oral 2 times daily 10/17/22 1802            Medications  furosemide  40 mg Oral Daily   lactulose  20 g Oral TID   levothyroxine  150 mcg Oral QAC breakfast   megestrol  40 mg Oral BID   midodrine  10 mg Oral TID WC   pantoprazole (PROTONIX) IV  40 mg Intravenous Q24H   potassium chloride  20 mEq Oral Daily   prochlorperazine  5 mg Intravenous Once   rifaximin  550 mg Oral BID   rOPINIRole  0.25 mg Oral QHS   spironolactone  25 mg Oral Daily   tamsulosin  0.4 mg Oral Daily   venlafaxine XR  75 mg Oral Q breakfast      Subjective:   [name] was seen and examined today.  States feeling a lot better, did not have any BM last night however had several BMs yesterday during the day.  Vaginal bleeding has improved.  Still feels deconditioned and has back pain.  States prior kyphoplasty had helped with the pain.  Objective:   Vitals:   10/20/22 1640 10/20/22 2017 10/21/22 0533 10/21/22 1249  BP: (!) 108/52 131/67 130/60   Pulse: 79 66 79 70  Resp: 18 16 19 18   Temp: 98.5 F (36.9 C) 98.3 F (36.8 C) 98.2 F (36.8 C) 97.7 F (36.5 C)  TempSrc: Oral Oral Oral Oral  SpO2: 99% 100% 100% 99%  Weight:      Height:        Intake/Output Summary (Last 24 hours) at 10/21/2022 1354 Last data filed at 10/21/2022 1300 Gross per 24 hour  Intake 568 ml  Output --  Net 568 ml     Wt Readings from Last 3 Encounters:  10/17/22 97.5 kg  10/10/22 100.7 kg  09/06/22 (!) 144.3 kg   Physical Exam General: Alert and oriented x 3, NAD Cardiovascular: S1 S2 clear, RRR.  Respiratory: CTAB, no  wheezing, Gastrointestinal: Soft, nontender, nondistended, NBS Ext: no pedal edema bilaterally Neuro: no new deficits Psych: Normal affect   Data Reviewed:  I have personally reviewed following labs    CBC Lab Results  Component Value Date   WBC 5.0 10/21/2022   RBC 2.72 (L) 10/21/2022   HGB 8.1 (L) 10/21/2022   HCT 27.0 (L) 10/21/2022  MCV 99.3 10/21/2022   MCH 29.8 10/21/2022   PLT 143 (L) 10/21/2022   MCHC 30.0 10/21/2022   RDW 15.3 10/21/2022   LYMPHSABS 1.6 10/18/2022   MONOABS 0.5 10/18/2022   EOSABS 0.4 10/18/2022   BASOSABS 0.0 10/18/2022     Last metabolic panel Lab Results  Component Value Date   NA 139 10/21/2022   K 3.8 10/21/2022   CL 111 10/21/2022   CO2 18 (L) 10/21/2022   BUN 16 10/21/2022   CREATININE 0.92 10/21/2022   GLUCOSE 122 (H) 10/21/2022   GFRNONAA >60 10/21/2022   GFRAA >60 01/19/2019   CALCIUM 7.9 (L) 10/21/2022   PHOS 2.9 10/05/2022   PROT 6.0 (L) 10/18/2022   ALBUMIN 2.4 (L) 10/18/2022   BILITOT 2.2 (H) 10/18/2022   ALKPHOS 103 10/18/2022   AST 44 (H) 10/18/2022   ALT 26 10/18/2022   ANIONGAP 10 10/21/2022    CBG (last 3)  No results for input(s): "GLUCAP" in the last 72 hours.     Coagulation Profile: Recent Labs  Lab 10/17/22 1425  INR 1.6*     Radiology Studies: I have personally reviewed the imaging studies  DG Thoracic Spine 2 View  Result Date: 10/20/2022 CLINICAL DATA:  Back pain. EXAM: THORACIC SPINE 2 VIEWS COMPARISON:  Thoracic spine MRI 08/28/2022, reformats from CT 08/29/2022 FINDINGS: There is straightening of normal thoracic kyphosis. The T3 and T5 compression fractures on prior MRI are not well-defined by radiograph. Compression fracture in the lower thoracic spine, tentatively T12, has progressed from prior exam. Radiographic assessment is limited due to soft tissue attenuation from habitus. IMPRESSION: 1. Compression fracture in the lower thoracic spine, tentatively T12, has progressed from prior exam.  2. The T3 and T5 compression fractures on prior MRI are not well-defined by radiograph. 3. Technically limited exam due to habitus. Electronically Signed   By: Narda Rutherford M.D.   On: 10/20/2022 18:28       Blong Busk M.D. [facility] 10/21/2022, 1:54 PM  Available via Epic secure chat 7am-7pm After 7 pm, please refer to night coverage provider listed on amion.

## 2022-10-21 NOTE — Progress Notes (Signed)
Mobility Specialist - Progress Note   10/21/22 1452  Mobility  Activity Ambulated with assistance to bathroom;Ambulated with assistance in hallway  Level of Assistance Standby assist, set-up cues, supervision of patient - no hands on  Assistive Device Front wheel walker  Distance Ambulated (ft) 150 ft  Range of Motion/Exercises Active  Activity Response Tolerated well  Mobility Referral Yes  $Mobility charge 1 Mobility  Mobility Specialist Start Time (ACUTE ONLY) 1440  Mobility Specialist Stop Time (ACUTE ONLY) 1452  Mobility Specialist Time Calculation (min) (ACUTE ONLY) 12 min   Pt was found in bed and agreeable to ambulate after bathroom use. Pt grew fatigued with session. 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 At EOS returned to bed with all needs met. Call bell in reach and bed alarm on.  Billey Chang Mobility Specialist

## 2022-10-21 NOTE — Progress Notes (Signed)
Patient ID: [name], female   DOB: 1955-12-01, 67 y.o.   MRN: [id number] Request received on patient for possible T12 kyphoplasty.  Latest imaging studies were reviewed by Dr. Archer Asa.  Patient appears to be candidate for T12 kyphoplasty and covered by insurance if she remains inpatient status. Patient noted to have staph in blood cultures from 10/8.  She will need to be clear of any infectious process prior to kyphoplasty.  Patient was not candidate for T3/T5 kyphoplasty in August of this year because of poor image resolution to safely perform case.  We will reevaluate patient next week and tentatively place on schedule for 10/17.

## 2022-10-22 ENCOUNTER — Encounter (HOSPITAL_COMMUNITY): Payer: Self-pay | Admitting: Gastroenterology

## 2022-10-22 DIAGNOSIS — K7682 Hepatic encephalopathy: Secondary | ICD-10-CM | POA: Diagnosis not present

## 2022-10-22 DIAGNOSIS — K7460 Unspecified cirrhosis of liver: Secondary | ICD-10-CM | POA: Diagnosis not present

## 2022-10-22 DIAGNOSIS — R4182 Altered mental status, unspecified: Secondary | ICD-10-CM | POA: Diagnosis not present

## 2022-10-22 LAB — CBC
HCT: 26.4 % — ABNORMAL LOW (ref 36.0–46.0)
Hemoglobin: 8.1 g/dL — ABNORMAL LOW (ref 12.0–15.0)
MCH: 29.9 pg (ref 26.0–34.0)
MCHC: 30.7 g/dL (ref 30.0–36.0)
MCV: 97.4 fL (ref 80.0–100.0)
Platelets: 132 10*3/uL — ABNORMAL LOW (ref 150–400)
RBC: 2.71 MIL/uL — ABNORMAL LOW (ref 3.87–5.11)
RDW: 15.3 % (ref 11.5–15.5)
WBC: 4.8 10*3/uL (ref 4.0–10.5)
nRBC: 0 % (ref 0.0–0.2)

## 2022-10-22 LAB — BASIC METABOLIC PANEL
Anion gap: 7 (ref 5–15)
BUN: 16 mg/dL (ref 8–23)
CO2: 21 mmol/L — ABNORMAL LOW (ref 22–32)
Calcium: 8.1 mg/dL — ABNORMAL LOW (ref 8.9–10.3)
Chloride: 112 mmol/L — ABNORMAL HIGH (ref 98–111)
Creatinine, Ser: 0.98 mg/dL (ref 0.44–1.00)
GFR, Estimated: >60 mL/min (ref >60)
Glucose, Bld: 118 mg/dL — ABNORMAL HIGH (ref 70–99)
Potassium: 3.7 mmol/L (ref 3.5–5.1)
Sodium: 140 mmol/L (ref 135–145)

## 2022-10-22 LAB — PROTIME-INR
INR: 1.8 — ABNORMAL HIGH (ref 0.8–1.2)
Prothrombin Time: 21.4 s — ABNORMAL HIGH (ref 11.4–15.2)

## 2022-10-22 MED ORDER — PANTOPRAZOLE SODIUM 40 MG PO TBEC
40.0000 mg | DELAYED_RELEASE_TABLET | Freq: Every day | ORAL | Status: DC
  Administered 2022-10-22 – 2022-10-30 (×9): 40 mg via ORAL
  Filled 2022-10-22 (×9): qty 1

## 2022-10-22 NOTE — Plan of Care (Signed)
  Problem: Clinical Measurements: Goal: Signs and symptoms of infection will decrease Outcome: Progressing   Problem: Education: Goal: Knowledge of General Education information will improve Description: Including pain rating scale, medication(s)/side effects and non-pharmacologic comfort measures Outcome: Progressing   Problem: Activity: Goal: Risk for activity intolerance will decrease Outcome: Progressing   Problem: Coping: Goal: Level of anxiety will decrease Outcome: Progressing   Problem: Elimination: Goal: Will not experience complications related to bowel motility Outcome: Progressing Goal: Will not experience complications related to urinary retention Outcome: Progressing   Problem: Pain Managment: Goal: General experience of comfort will improve Outcome: Progressing   Problem: Safety: Goal: Ability to remain free from injury will improve Outcome: Progressing   Problem: Skin Integrity: Goal: Risk for impaired skin integrity will decrease Outcome: Progressing

## 2022-10-22 NOTE — Progress Notes (Signed)
Mobility Specialist - Progress Note   10/22/22 1359  Mobility  Activity Ambulated with assistance to bathroom  Level of Assistance Standby assist, set-up cues, supervision of patient - no hands on  Assistive Device Front wheel walker  Distance Ambulated (ft) 10 ft  Range of Motion/Exercises Active  Activity Response Tolerated fair  Mobility Referral Yes  $Mobility charge 1 Mobility  Mobility Specialist Start Time (ACUTE ONLY) 1350  Mobility Specialist Stop Time (ACUTE ONLY) 1359  Mobility Specialist Time Calculation (min) (ACUTE ONLY) 9 min   Pt was found in bed wanting to use bathroom. Pt c/o back and knee pain. Returned to bed with all needs met. Call bell  in reach and RN in room.  Billey Chang Mobility Specialist

## 2022-10-22 NOTE — Progress Notes (Signed)
Mobility Specialist - Progress Note   10/22/22 0936  Mobility  Activity Ambulated with assistance in hallway  Level of Assistance Standby assist, set-up cues, supervision of patient - no hands on  Assistive Device Front wheel walker  Distance Ambulated (ft) 150 ft  Range of Motion/Exercises Active  Activity Response Tolerated well  Mobility Referral Yes  $Mobility charge 1 Mobility  Mobility Specialist Start Time (ACUTE ONLY) [id number]  Mobility Specialist Stop Time (ACUTE ONLY) 0936  Mobility Specialist Time Calculation (min) (ACUTE ONLY) 18 min   Pt was found in bathroom and agreeable to ambulate. Pt grew fatigued with session. Had x1 brief standing rest break due to fatigue. At EOS returned to bed with all needs met. Call bell in reach and bed alarm on.  Billey Chang Mobility Specialist

## 2022-10-22 NOTE — Progress Notes (Signed)
[facility]                                                                              [name], is a 67 y.o. female, DOB - March 10, 1955, [id number] Admit date - 10/17/2022    Outpatient Primary MD for the patient is [name], MD  LOS - 5  days  Chief Complaint  Patient presents with   Weakness   Altered Mental Status       Brief summary   Patient is a 67 year old female with known alcoholic fatty liver disease, chronic diarrhea,(takes lactulose only if she does not move bm 3 to 4 times daily)  hypertension, obesity, PE, short gut syndrome, hypothyroidism, cholelithiasis, admitted with increasing confusion.  Patient lives at home with her son.  According to son she has been acting confused  Son noticed that she is more weak and confused than her baseline she had difficulty getting dressed and getting out of the house. Patient was recently admitted to the hospital 09/30/2022 discharged on 10/12/2022. Recent hospital admission she had sepsis secondary to Pseudomonas UTI and bacteremia.  Blood culture grew ESBL Pseudomonas treated with meropenem completed on 10/11/2022.  Follow-up blood cultures were negative.  She had vaginal bleeding she was started on Megace she was due to follow-up with gynecologist later this month.  Son tells me she was also is due to see infectious disease on 10/17/2022 but has not seen ended up in the hospital. Denies having sleep apnea.  Denies urinary complaints. She has some rash on the center of her chest which apparently is a cat scratch from her cat. Son reported she bites her tongue at times no history of seizures or incontinence reported ED Course: TSH 4.5 normal white count ammonia level 81 vital signs stable ABG unremarkable, CT head no acute intracranial abnormality.  Hypodensity through the right cerebellum artifact likely.   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  Hematemesis, concern for variceal bleeding -Continue octreotide, PPI.  -EGD on 10/9 showed normal esophagus, friable gastric mucosa, portal hypertensive gastropathy, normal duodenum.  No active bleeding -Patient's son did not think she had hematemesis but had dried blood from the bitten tongue day before -H&H remained stable  Chronic hypotension -Continue midodrine  Hypothyroidism -Continue Synthroid   Vaginal bleeding -Continue Megace 40 mg twice daily, has appointment with GYN in 11/03/2022    Acute on chronic back pain -Patient had kyphoplasty T3 and T5 in 08/2022 -Continue robaxin, oxycodone -X-ray showed compression fracture in the lower thoracic spine, tentatively T12 has progressed on from prior exam -Discussed with the patient, she reported improvement in her back pain after the  prior kyphoplasty, IR evaluation requested  -Appreciate IR evaluation, blood cultures from 10/8 while Staph epidermidis, 1 set likely contaminant.  I have sent to the repeat blood cultures yesterday.  Requested IR to reevaluate are negative for kyphoplasty if blood cultures   Obesity Estimated body mass index is 40.36 kg/m as calculated from the following:   Height as of this encounter: 5\' 2"  (1.575 m).   Weight as of this encounter: 100.1 kg.  Code Status: DNR are DVT Prophylaxis:  SCDs Start: 10/17/22 1759   Level of Care: Level of care: Progressive Family Communication: Updated  patient's son at the bedside on a 10/10 Disposition Plan:      Remains inpatient appropriate:      Procedures:  EGD 10/9  Consultants:   GI  Antimicrobials:   Anti-infectives (From admission, onward)    Start     Dose/Rate Route Frequency Ordered Stop   10/18/22 0030  cefTRIAXone (ROCEPHIN) 1 g in sodium chloride 0.9 % 100 mL IVPB        1 g 200 mL/hr over 30 Minutes Intravenous  Once 10/18/22 0022 10/18/22 0145   10/17/22 2200  rifaximin (XIFAXAN) tablet 550 mg        550 mg Oral 2 times daily 10/17/22 1802            Medications  furosemide  40 mg Oral Daily   lactulose  20 g Oral TID   levothyroxine  150 mcg Oral QAC breakfast   megestrol  40 mg Oral BID   midodrine  10 mg Oral TID WC   pantoprazole  40 mg Oral Daily   potassium chloride  20 mEq Oral Daily   prochlorperazine  5 mg Intravenous Once   rifaximin  550 mg Oral BID   rOPINIRole  0.25 mg Oral QHS   spironolactone  25 mg Oral Daily   tamsulosin  0.4 mg Oral Daily   venlafaxine XR  75 mg Oral Q breakfast      Subjective:   Miste Kluever was seen and examined today.  No acute complaints, still has some abdominal pain and back pain.  States several BMs yesterday.  Vaginal bleeding has improved.  No acute issues overnight.  Objective:   Vitals:   10/21/22 2108 10/22/22 0211 10/22/22 0423 10/22/22 0618  BP: (!) 109/43 (!) 101/55 (!) 107/59   Pulse: 72 76 73   Resp: 20  (!) 21   Temp: 98.1 F (36.7 C)  98.7 F (37.1 C)   TempSrc: Oral  Oral   SpO2: 99%  100%   Weight:    100.1 kg  Height:        Intake/Output Summary (Last 24 hours) at 10/22/2022 1032 Last data filed at 10/22/2022 1610 Gross per 24 hour  Intake 420 ml  Output --  Net 420 ml     Wt Readings from Last 3 Encounters:  10/22/22 100.1 kg  10/10/22 100.7 kg  09/06/22 (!) 144.3 kg   Physical Exam General: Alert and oriented x 3, NAD, frail and ill-appearing Cardiovascular: S1 S2 clear, RRR.  Respiratory:  CTAB Gastrointestinal: Soft, nontender, nondistended, NBS Ext: no pedal edema bilaterally Neuro: no new deficits Psych: Normal affect   Data Reviewed:  I have personally reviewed following labs    CBC Lab Results  Component Value Date   WBC 4.8 10/22/2022   RBC 2.71 (L) 10/22/2022   HGB 8.1 (L) 10/22/2022   HCT 26.4 (L) 10/22/2022   MCV 97.4 10/22/2022  MCH 29.9 10/22/2022   PLT 132 (L) 10/22/2022   MCHC 30.7 10/22/2022   RDW 15.3 10/22/2022   LYMPHSABS 1.6 10/18/2022   MONOABS 0.5 10/18/2022   EOSABS 0.4 10/18/2022   BASOSABS 0.0 10/18/2022     Last metabolic panel Lab Results  Component Value Date   NA 140 10/22/2022   K 3.7 10/22/2022   CL 112 (H) 10/22/2022   CO2 21 (L) 10/22/2022   BUN 16 10/22/2022   CREATININE 0.98 10/22/2022   GLUCOSE 118 (H) 10/22/2022   GFRNONAA >60 10/22/2022   GFRAA >60 01/19/2019   CALCIUM 8.1 (L) 10/22/2022   PHOS 2.9 10/05/2022   PROT 6.0 (L) 10/18/2022   ALBUMIN 2.4 (L) 10/18/2022   BILITOT 2.2 (H) 10/18/2022   ALKPHOS 103 10/18/2022   AST 44 (H) 10/18/2022   ALT 26 10/18/2022   ANIONGAP 7 10/22/2022    CBG (last 3)  No results for input(s): "GLUCAP" in the last 72 hours.     Coagulation Profile: Recent Labs  Lab 10/17/22 1425 10/22/22 0400  INR 1.6* 1.8*     Radiology Studies: I have personally reviewed the imaging studies  DG Thoracic Spine 2 View  Result Date: 10/20/2022 CLINICAL DATA:  Back pain. EXAM: THORACIC SPINE 2 VIEWS COMPARISON:  Thoracic spine MRI 08/28/2022, reformats from CT 08/29/2022 FINDINGS: There is straightening of normal thoracic kyphosis. The T3 and T5 compression fractures on prior MRI are not well-defined by radiograph. Compression fracture in the lower thoracic spine, tentatively T12, has progressed from prior exam. Radiographic assessment is limited due to soft tissue attenuation from habitus. IMPRESSION: 1. Compression fracture in the lower thoracic spine, tentatively T12, has progressed  from prior exam. 2. The T3 and T5 compression fractures on prior MRI are not well-defined by radiograph. 3. Technically limited exam due to habitus. Electronically Signed   By: Narda Rutherford M.D.   On: 10/20/2022 18:28       Lavontae Cornia M.D. [facility] 10/22/2022, 10:32 AM  Available via Epic secure chat 7am-7pm After 7 pm, please refer to night coverage provider listed on amion.

## 2022-10-23 DIAGNOSIS — K7460 Unspecified cirrhosis of liver: Secondary | ICD-10-CM | POA: Diagnosis not present

## 2022-10-23 DIAGNOSIS — R4182 Altered mental status, unspecified: Secondary | ICD-10-CM | POA: Diagnosis not present

## 2022-10-23 DIAGNOSIS — K7682 Hepatic encephalopathy: Secondary | ICD-10-CM | POA: Diagnosis not present

## 2022-10-23 LAB — CULTURE, BLOOD (ROUTINE X 2): Culture: NO GROWTH

## 2022-10-23 LAB — BASIC METABOLIC PANEL
Anion gap: 6 (ref 5–15)
BUN: 15 mg/dL (ref 8–23)
CO2: 19 mmol/L — ABNORMAL LOW (ref 22–32)
Calcium: 8 mg/dL — ABNORMAL LOW (ref 8.9–10.3)
Chloride: 115 mmol/L — ABNORMAL HIGH (ref 98–111)
Creatinine, Ser: 0.98 mg/dL (ref 0.44–1.00)
GFR, Estimated: >60 mL/min (ref >60)
Glucose, Bld: 100 mg/dL — ABNORMAL HIGH (ref 70–99)
Potassium: 3.6 mmol/L (ref 3.5–5.1)
Sodium: 140 mmol/L (ref 135–145)

## 2022-10-23 LAB — CBC
HCT: 26.3 % — ABNORMAL LOW (ref 36.0–46.0)
Hemoglobin: 8 g/dL — ABNORMAL LOW (ref 12.0–15.0)
MCH: 29.6 pg (ref 26.0–34.0)
MCHC: 30.4 g/dL (ref 30.0–36.0)
MCV: 97.4 fL (ref 80.0–100.0)
Platelets: 135 10*3/uL — ABNORMAL LOW (ref 150–400)
RBC: 2.7 MIL/uL — ABNORMAL LOW (ref 3.87–5.11)
RDW: 15.6 % — ABNORMAL HIGH (ref 11.5–15.5)
WBC: 5.4 10*3/uL (ref 4.0–10.5)
nRBC: 0 % (ref 0.0–0.2)

## 2022-10-23 MED ORDER — HYDROCORTISONE 0.5 % EX CREA
TOPICAL_CREAM | Freq: Three times a day (TID) | CUTANEOUS | Status: DC | PRN
  Filled 2022-10-23: qty 28.35

## 2022-10-23 MED ORDER — INFLUENZA VAC A&B SURF ANT ADJ 0.5 ML IM SUSY
0.5000 mL | PREFILLED_SYRINGE | INTRAMUSCULAR | Status: AC
  Administered 2022-10-24: 0.5 mL via INTRAMUSCULAR
  Filled 2022-10-23: qty 0.5

## 2022-10-23 MED ORDER — PNEUMOCOCCAL 20-VAL CONJ VACC 0.5 ML IM SUSY
0.5000 mL | PREFILLED_SYRINGE | INTRAMUSCULAR | Status: AC
  Administered 2022-10-24: 0.5 mL via INTRAMUSCULAR
  Filled 2022-10-23: qty 0.5

## 2022-10-23 NOTE — Plan of Care (Signed)

## 2022-10-23 NOTE — Progress Notes (Signed)
Mobility Specialist - Progress Note   10/23/22 1410  Mobility  Activity Ambulated with assistance to bathroom  Level of Assistance Standby assist, set-up cues, supervision of patient - no hands on  Assistive Device Front wheel walker  Distance Ambulated (ft) 20 ft  Activity Response Tolerated well  Mobility Referral Yes  $Mobility charge 1 Mobility  Mobility Specialist Start Time (ACUTE ONLY) 0152  Mobility Specialist Stop Time (ACUTE ONLY) 0210  Mobility Specialist Time Calculation (min) (ACUTE ONLY) 18 min   Pt received in bed and agreeable to mobility. Prior to ambulating, pt requested assistance to bathroom for BM. Pt was minA from supine>sitting. Upon returning  from bathroom, pt c/o hip & back pain, haltering any further ambulation. Assisted pt back to bed with all needs met. RN notified & in room.  [facility]

## 2022-10-23 NOTE — Plan of Care (Signed)
Problem: Respiratory: Goal: Ability to maintain adequate ventilation will improve Outcome: Progressing   Problem: Education: Goal: Knowledge of General Education information will improve Description: Including pain rating scale, medication(s)/side effects and non-pharmacologic comfort measures Outcome: Progressing   Problem: Activity: Goal: Risk for activity intolerance will decrease Outcome: Progressing   Problem: Nutrition: Goal: Adequate nutrition will be maintained Outcome: Progressing   Problem: Coping: Goal: Level of anxiety will decrease Outcome: Progressing   Problem: Elimination: Goal: Will not experience complications related to bowel motility Outcome: Progressing Goal: Will not experience complications related to urinary retention Outcome: Progressing   Problem: Pain Managment: Goal: General experience of comfort will improve Outcome: Progressing   Problem: Safety: Goal: Ability to remain free from injury will improve Outcome: Progressing   Problem: Skin Integrity: Goal: Risk for impaired skin integrity will decrease Outcome: Progressing

## 2022-10-23 NOTE — Progress Notes (Signed)
[facility]                                                                              [name], is a 67 y.o. female, DOB - 02/09/1955, [id number] Admit date - 10/17/2022    Outpatient Primary MD for the patient is [name], MD  LOS - 6  days  Chief Complaint  Patient presents with   Weakness   Altered Mental Status       Brief summary   Patient is a 67 year old female with known alcoholic fatty liver disease, chronic diarrhea,(takes lactulose only if she does not move bm 3 to 4 times daily)  hypertension, obesity, PE, short gut syndrome, hypothyroidism, cholelithiasis, admitted with increasing confusion.  Patient lives at home with her son.  According to son she has been acting confused  Son noticed that she is more weak and confused than her baseline she had difficulty getting dressed and getting out of the house. Patient was recently admitted to the hospital 09/30/2022 discharged on 10/12/2022. Recent hospital admission she had sepsis secondary to Pseudomonas UTI and bacteremia.  Blood culture grew ESBL Pseudomonas treated with meropenem completed on 10/11/2022.  Follow-up blood cultures were negative.  She had vaginal bleeding she was started on Megace she was due to follow-up with gynecologist later this month.  Son tells me she was also is due to see infectious disease on 10/17/2022 but has not seen ended up in the hospital. Denies having sleep apnea.  Denies urinary complaints. She has some rash on the center of her chest which apparently is a cat scratch from her cat. Son reported she bites her tongue at times no history of seizures or incontinence reported ED Course: TSH 4.5 normal white count ammonia level 81 vital signs stable ABG unremarkable, CT head no acute intracranial abnormality.  Hypodensity through the right cerebellum artifact likely.   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  Hematemesis, concern for variceal bleeding -Placed on octreotide, PPI, GI consulted.    -EGD on 10/9 showed normal esophagus, friable gastric mucosa, portal hypertensive gastropathy, normal duodenum.  No active bleeding -Patient's son did not think she had hematemesis but had dried blood from the bitten tongue day before -h/h stable, 8.0  Chronic hypotension -Continue midodrine  Hypothyroidism -Continue Synthroid   Vaginal bleeding -Continue Megace 40 mg twice daily, has appointment with GYN in 11/03/2022    Acute on chronic back pain -Patient had kyphoplasty T3 and T5 in 08/2022 -Continue robaxin, oxycodone -X-ray showed compression fracture in the lower thoracic spine, tentatively T12 has progressed on from prior exam. Discussed with the patient, she reported improvement in her back  pain after the prior kyphoplasty, IR evaluation requested  -Appreciate IR evaluation, blood cultures from 10/8 while Staph epidermidis, 1 set likely contaminant.   Requested IR to reevaluate for kyphoplasty if repeat blood cultures are negative  -Blood cultures 10/11 so far negative   Obesity Estimated body mass index is 40.36 kg/m as calculated from the following:   Height as of this encounter: 5\' 2"  (1.575 m).   Weight as of this encounter: 100.1 kg.  Code Status: DNR are DVT Prophylaxis:  SCDs Start: 10/17/22 1759   Level of Care: Level of care: Progressive Family Communication: Updated patient's son at  the bedside on a 10/10.  Currently patient alert and oriented Disposition Plan:      Remains inpatient appropriate:      Procedures:  EGD 10/9  Consultants:   GI  Antimicrobials:   Anti-infectives (From admission, onward)    Start     Dose/Rate Route Frequency Ordered Stop   10/18/22 0030  cefTRIAXone (ROCEPHIN) 1 g in sodium chloride 0.9 % 100 mL IVPB        1 g 200 mL/hr over 30 Minutes Intravenous  Once 10/18/22 0022 10/18/22 0145   10/17/22 2200  rifaximin (XIFAXAN) tablet 550 mg        550 mg Oral 2 times daily 10/17/22 1802            Medications  furosemide  40 mg Oral Daily   lactulose  20 g Oral TID   levothyroxine  150 mcg Oral QAC breakfast   megestrol  40 mg Oral BID   midodrine  10 mg Oral TID WC   pantoprazole  40 mg Oral Daily   potassium chloride  20 mEq Oral Daily   prochlorperazine  5 mg Intravenous Once   rifaximin  550 mg Oral BID   rOPINIRole  0.25 mg Oral QHS   spironolactone  25 mg Oral Daily   tamsulosin  0.4 mg Oral Daily   venlafaxine XR  75 mg Oral Q breakfast      Subjective:   [name] was seen and examined today.  No acute complaints, states had 3 BMs overnight and 3 during the day.  Still having back pain.  Otherwise no nausea vomiting, chest pain, shortness of breath.  Fairly alert and oriented.    Objective:   Vitals:   10/22/22 0618 10/22/22 1249 10/22/22 2028 10/23/22 0405  BP:  110/70 (!) 118/57 131/72  Pulse:  94 74 92  Resp:  16 18 18   Temp:  98.3 F (36.8 C) 97.8 F (36.6 C) 98.2 F (36.8 C)  TempSrc:  Oral Oral Oral  SpO2:  100% 100% 100%  Weight: 100.1 kg     Height:        Intake/Output Summary (Last 24 hours) at 10/23/2022 1159 Last data filed at 10/22/2022 1237 Gross per 24 hour  Intake 120 ml  Output --  Net 120 ml     Wt Readings from Last 3 Encounters:  10/22/22 100.1 kg  10/10/22 100.7 kg  09/06/22 (!) 144.3 kg    Physical Exam General: Alert and oriented x 3, NAD Cardiovascular:  S1 S2 clear, RRR.  Respiratory: CTAB, no wheezing Gastrointestinal: Soft, nontender, nondistended, NBS Ext: no pedal edema bilaterally Neuro: no new deficits Psych: Normal affect   Data Reviewed:  I have personally reviewed following labs    CBC Lab Results  Component Value Date   WBC 5.4 10/23/2022   RBC 2.70 (L) 10/23/2022  HGB 8.0 (L) 10/23/2022   HCT 26.3 (L) 10/23/2022   MCV 97.4 10/23/2022   MCH 29.6 10/23/2022   PLT 135 (L) 10/23/2022   MCHC 30.4 10/23/2022   RDW 15.6 (H) 10/23/2022   LYMPHSABS 1.6 10/18/2022   MONOABS 0.5 10/18/2022   EOSABS 0.4 10/18/2022   BASOSABS 0.0 10/18/2022     Last metabolic panel Lab Results  Component Value Date   NA 140 10/23/2022   K 3.6 10/23/2022   CL 115 (H) 10/23/2022   CO2 19 (L) 10/23/2022   BUN 15 10/23/2022   CREATININE 0.98 10/23/2022   GLUCOSE 100 (H) 10/23/2022   GFRNONAA >60 10/23/2022   GFRAA >60 01/19/2019   CALCIUM 8.0 (L) 10/23/2022   PHOS 2.9 10/05/2022   PROT 6.0 (L) 10/18/2022   ALBUMIN 2.4 (L) 10/18/2022   BILITOT 2.2 (H) 10/18/2022   ALKPHOS 103 10/18/2022   AST 44 (H) 10/18/2022   ALT 26 10/18/2022   ANIONGAP 6 10/23/2022    CBG (last 3)  No results for input(s): "GLUCAP" in the last 72 hours.     Coagulation Profile: Recent Labs  Lab 10/17/22 1425 10/22/22 0400  INR 1.6* 1.8*     Radiology Studies: I have personally reviewed the imaging studies  No results found.     Thad Ranger M.D. [facility] 10/23/2022, 11:59 AM  Available via Epic secure chat 7am-7pm After 7 pm, please refer to night coverage provider listed on amion.

## 2022-10-24 DIAGNOSIS — G9341 Metabolic encephalopathy: Secondary | ICD-10-CM

## 2022-10-24 DIAGNOSIS — D508 Other iron deficiency anemias: Secondary | ICD-10-CM | POA: Diagnosis not present

## 2022-10-24 DIAGNOSIS — K7460 Unspecified cirrhosis of liver: Secondary | ICD-10-CM | POA: Diagnosis not present

## 2022-10-24 DIAGNOSIS — K7682 Hepatic encephalopathy: Secondary | ICD-10-CM | POA: Diagnosis not present

## 2022-10-24 LAB — CBC
HCT: 27.2 % — ABNORMAL LOW (ref 36.0–46.0)
Hemoglobin: 8.3 g/dL — ABNORMAL LOW (ref 12.0–15.0)
MCH: 30.2 pg (ref 26.0–34.0)
MCHC: 30.5 g/dL (ref 30.0–36.0)
MCV: 98.9 fL (ref 80.0–100.0)
Platelets: 119 10*3/uL — ABNORMAL LOW (ref 150–400)
RBC: 2.75 MIL/uL — ABNORMAL LOW (ref 3.87–5.11)
RDW: 15.6 % — ABNORMAL HIGH (ref 11.5–15.5)
WBC: 5.1 10*3/uL (ref 4.0–10.5)
nRBC: 0 % (ref 0.0–0.2)

## 2022-10-24 LAB — BASIC METABOLIC PANEL
Anion gap: 5 (ref 5–15)
BUN: 14 mg/dL (ref 8–23)
CO2: 19 mmol/L — ABNORMAL LOW (ref 22–32)
Calcium: 8 mg/dL — ABNORMAL LOW (ref 8.9–10.3)
Chloride: 116 mmol/L — ABNORMAL HIGH (ref 98–111)
Creatinine, Ser: 0.94 mg/dL (ref 0.44–1.00)
GFR, Estimated: >60 mL/min (ref >60)
Glucose, Bld: 113 mg/dL — ABNORMAL HIGH (ref 70–99)
Potassium: 3.6 mmol/L (ref 3.5–5.1)
Sodium: 140 mmol/L (ref 135–145)

## 2022-10-24 MED ORDER — ORAL CARE MOUTH RINSE: 15.0000 mL | OROMUCOSAL | Status: DC | PRN

## 2022-10-24 NOTE — Plan of Care (Signed)
  Problem: Fluid Volume: Goal: Hemodynamic stability will improve Outcome: Progressing   Problem: Respiratory: Goal: Ability to maintain adequate ventilation will improve Outcome: Progressing   Problem: Clinical Measurements: Goal: Ability to maintain clinical measurements within normal limits will improve Outcome: Progressing   Problem: Activity: Goal: Risk for activity intolerance will decrease Outcome: Progressing   Problem: Coping: Goal: Level of anxiety will decrease Outcome: Progressing

## 2022-10-24 NOTE — Progress Notes (Signed)
Mobility Specialist - Progress Note   10/24/22 1407  Mobility  Activity Ambulated with assistance to bathroom;Ambulated with assistance in hallway  Level of Assistance Standby assist, set-up cues, supervision of patient - no hands on  Assistive Device Front wheel walker  Distance Ambulated (ft) 200 ft  Range of Motion/Exercises Active  Activity Response Tolerated fair  Mobility Referral Yes  $Mobility charge 1 Mobility  Mobility Specialist Start Time (ACUTE ONLY) 1345  Mobility Specialist Stop Time (ACUTE ONLY) 1407  Mobility Specialist Time Calculation (min) (ACUTE ONLY) 22 min   Pt was found in bed and agreeable to ambulate after bathroom use. C/o back and knee pain. Had x3 brief standing rest breaks due to pain. At EOS returned to bed with all needs met. Call bell in reach and son in room.  Billey Chang Mobility Specialist

## 2022-10-24 NOTE — Progress Notes (Signed)
[facility]                                                                              [name], is a 67 y.o. female, DOB - 09-Aug-1955, [id number] Admit date - 10/17/2022    Outpatient Primary MD for the patient is [name], MD  LOS - 7  days  Chief Complaint  Patient presents with   Weakness   Altered Mental Status       Brief summary   Patient is a 67 year old female with known alcoholic fatty liver disease, chronic diarrhea,(takes lactulose only if she does not move bm 3 to 4 times daily)  hypertension, obesity, PE, short gut syndrome, hypothyroidism, cholelithiasis, admitted with increasing confusion.  Patient lives at home with her son.  According to son she has been acting confused  Son noticed that she is more weak and confused than her baseline she had difficulty getting dressed and getting out of the house. Patient was recently admitted to the hospital 09/30/2022 discharged on 10/12/2022. Recent hospital admission she had sepsis secondary to Pseudomonas UTI and bacteremia.  Blood culture grew ESBL Pseudomonas treated with meropenem completed on 10/11/2022.  Follow-up blood cultures were negative.  She had vaginal bleeding she was started on Megace she was due to follow-up with gynecologist later this month.  Son tells me she was also is due to see infectious disease on 10/17/2022 but has not seen ended up in the hospital. Denies having sleep apnea.  Denies urinary complaints. She has some rash on the center of her chest which apparently is a cat scratch from her cat. Son reported she bites her tongue at times no history of seizures or incontinence reported ED Course: TSH 4.5 normal white count ammonia level 81 vital signs stable ABG unremarkable, CT head no acute intracranial abnormality.  Hypodensity through the right cerebellum artifact likely.   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  Hematemesis, concern for variceal bleeding -Placed on octreotide, PPI, GI consulted.    -EGD on 10/9 showed normal esophagus, friable gastric mucosa, portal hypertensive gastropathy, normal duodenum.  No active bleeding -Patient's son did not think she had hematemesis but had dried blood from the bitten tongue day before -h/h stable, 8.0  Chronic hypotension -Continue midodrine  Hypothyroidism -Continue Synthroid   Vaginal bleeding -Continue Megace 40 mg twice daily, has appointment with GYN in 11/03/2022    Acute on chronic back pain -Patient had kyphoplasty T3 and T5 in 08/2022 -Continue robaxin, oxycodone -X-ray showed compression fracture in the lower thoracic spine, tentatively T12 has progressed on from prior exam. Discussed with the patient, she reported improvement in her back pain  after the prior kyphoplasty, IR evaluation requested  -Appreciate IR evaluation, blood cultures from 10/8 while Staph epidermidis, 1 set likely contaminant.   -Blood cultures 10/11 so far negative -Awaiting IR evaluation   Obesity Estimated body mass index is 40.36 kg/m as calculated from the following:   Height as of this encounter: 5\' 2"  (1.575 m).   Weight as of this encounter: 100.1 kg.  Code Status: DNR are DVT Prophylaxis:  SCDs Start: 10/17/22 1759   Level of Care: Level of care: Progressive Family Communication: Updated patient Disposition Plan:      Remains inpatient appropriate:      Procedures:   EGD 10/9  Consultants:   GI  Antimicrobials:   Anti-infectives (From admission, onward)    Start     Dose/Rate Route Frequency Ordered Stop   10/18/22 0030  cefTRIAXone (ROCEPHIN) 1 g in sodium chloride 0.9 % 100 mL IVPB        1 g 200 mL/hr over 30 Minutes Intravenous  Once 10/18/22 0022 10/18/22 0145   10/17/22 2200  rifaximin (XIFAXAN) tablet 550 mg        550 mg Oral 2 times daily 10/17/22 1802            Medications  furosemide  40 mg Oral Daily   lactulose  20 g Oral TID   levothyroxine  150 mcg Oral QAC breakfast   megestrol  40 mg Oral BID   midodrine  10 mg Oral TID WC   pantoprazole  40 mg Oral Daily   potassium chloride  20 mEq Oral Daily   prochlorperazine  5 mg Intravenous Once   rifaximin  550 mg Oral BID   rOPINIRole  0.25 mg Oral QHS   spironolactone  25 mg Oral Daily   tamsulosin  0.4 mg Oral Daily   venlafaxine XR  75 mg Oral Q breakfast      Subjective:   [name] was seen and examined today.  No acute complaints except upper back pain.  No fevers or chills, no nausea vomiting, abdominal pain.    Objective:   Vitals:   10/24/22 0514 10/24/22 0517 10/24/22 0928 10/24/22 1300  BP: 120/64 120/64 (!) 108/57   Pulse: 71 74 77   Resp: 18 18 17    Temp: 98.3 F (36.8 C) 98.3 F (36.8 C) 99 F (37.2 C) 98.6 F (37 C)  TempSrc: Oral Oral Oral Oral  SpO2: 100% 100% 98%   Weight:      Height:        Intake/Output Summary (Last 24 hours) at 10/24/2022 1448 Last data filed at 10/23/2022 2200 Gross per 24 hour  Intake 480 ml  Output --  Net 480 ml     Wt Readings from Last 3 Encounters:  10/22/22 100.1 kg  10/10/22 100.7 kg  09/06/22 (!) 144.3 kg   Physical Exam General: Alert and oriented x 3, NAD Cardiovascular: S1 S2 clear, RRR.  Respiratory: CTAB Gastrointestinal: Soft, nontender, nondistended, NBS Ext: no pedal edema bilaterally Neuro: no new deficits Psych: Normal affect   Data Reviewed:  I have personally reviewed  following labs    CBC Lab Results  Component Value Date   WBC 5.1 10/24/2022   RBC 2.75 (L) 10/24/2022   HGB 8.3 (L) 10/24/2022   HCT 27.2 (L) 10/24/2022   MCV 98.9 10/24/2022   MCH 30.2 10/24/2022   PLT 119 (L) 10/24/2022   MCHC 30.5 10/24/2022   RDW 15.6 (H) 10/24/2022   LYMPHSABS 1.6  10/18/2022   MONOABS 0.5 10/18/2022   EOSABS 0.4 10/18/2022   BASOSABS 0.0 10/18/2022     Last metabolic panel Lab Results  Component Value Date   NA 140 10/24/2022   K 3.6 10/24/2022   CL 116 (H) 10/24/2022   CO2 19 (L) 10/24/2022   BUN 14 10/24/2022   CREATININE 0.94 10/24/2022   GLUCOSE 113 (H) 10/24/2022   GFRNONAA >60 10/24/2022   GFRAA >60 01/19/2019   CALCIUM 8.0 (L) 10/24/2022   PHOS 2.9 10/05/2022   PROT 6.0 (L) 10/18/2022   ALBUMIN 2.4 (L) 10/18/2022   BILITOT 2.2 (H) 10/18/2022   ALKPHOS 103 10/18/2022   AST 44 (H) 10/18/2022   ALT 26 10/18/2022   ANIONGAP 5 10/24/2022    CBG (last 3)  No results for input(s): "GLUCAP" in the last 72 hours.     Coagulation Profile: Recent Labs  Lab 10/22/22 0400  INR 1.8*     Radiology Studies: I have personally reviewed the imaging studies  No results found.     Thad Ranger M.D. [facility] 10/24/2022, 2:48 PM  Available via Epic secure chat 7am-7pm After 7 pm, please refer to night coverage provider listed on amion.

## 2022-10-25 DIAGNOSIS — K7460 Unspecified cirrhosis of liver: Secondary | ICD-10-CM | POA: Diagnosis not present

## 2022-10-25 DIAGNOSIS — K7682 Hepatic encephalopathy: Secondary | ICD-10-CM | POA: Diagnosis not present

## 2022-10-25 DIAGNOSIS — G9341 Metabolic encephalopathy: Secondary | ICD-10-CM | POA: Diagnosis not present

## 2022-10-25 DIAGNOSIS — R4182 Altered mental status, unspecified: Secondary | ICD-10-CM | POA: Diagnosis not present

## 2022-10-25 LAB — AMMONIA: Ammonia: 67 umol/L — ABNORMAL HIGH (ref 9–35)

## 2022-10-25 MED ORDER — OXYCODONE HCL 5 MG PO TABS
5.0000 mg | ORAL_TABLET | ORAL | Status: DC | PRN
  Administered 2022-10-25 – 2022-10-26 (×7): 5 mg via ORAL
  Filled 2022-10-25 (×7): qty 1

## 2022-10-25 MED ORDER — CIPROFLOXACIN-DEXAMETHASONE 0.3-0.1 % OT SUSP
4.0000 [drp] | Freq: Two times a day (BID) | OTIC | Status: DC
  Administered 2022-10-25 – 2022-10-30 (×11): 4 [drp] via OTIC
  Filled 2022-10-25: qty 7.5

## 2022-10-25 MED ORDER — CIPROFLOXACIN-FLUOCINOLONE PF 0.3-0.025 % OT SOLN: 0.2500 mL | Freq: Two times a day (BID) | OTIC | Status: DC

## 2022-10-25 NOTE — Progress Notes (Signed)
Mobility Specialist - Progress Note   10/25/22 1500  Mobility  Activity Ambulated with assistance in hallway  Level of Assistance Standby assist, set-up cues, supervision of patient - no hands on  Assistive Device Front wheel walker  Distance Ambulated (ft) 150 ft  Range of Motion/Exercises Active  Activity Response Tolerated well  Mobility Referral Yes  $Mobility charge 1 Mobility  Mobility Specialist Start Time (ACUTE ONLY) 1450  Mobility Specialist Stop Time (ACUTE ONLY) 1500  Mobility Specialist Time Calculation (min) (ACUTE ONLY) 10 min   Pt was found in bed and agreeable to ambulate. No complaints with session. At EOS returned to bed with all needs met. Call bell in reach and bed alarm on.  Billey Chang Mobility Specialist

## 2022-10-25 NOTE — Plan of Care (Signed)
  Problem: Fluid Volume: Goal: Hemodynamic stability will improve Outcome: Progressing   Problem: Health Behavior/Discharge Planning: Goal: Ability to manage health-related needs will improve Outcome: Progressing   Problem: Activity: Goal: Risk for activity intolerance will decrease Outcome: Progressing   Problem: Nutrition: Goal: Adequate nutrition will be maintained Outcome: Progressing   Problem: Coping: Goal: Level of anxiety will decrease Outcome: Progressing   Problem: Safety: Goal: Ability to remain free from injury will improve Outcome: Progressing   Problem: Skin Integrity: Goal: Risk for impaired skin integrity will decrease Outcome: Progressing

## 2022-10-25 NOTE — Progress Notes (Signed)
[facility]                                                                              [name], is a 67 y.o. female, DOB - [birth year], [id number] Admit date - 10/17/2022    Outpatient Primary MD for the patient is [name], MD  LOS - 8  days  Chief Complaint  Patient presents with   Weakness   Altered Mental Status       Brief summary   Patient is a 67 year old female with known alcoholic fatty liver disease, chronic diarrhea,(takes lactulose only if she does not move bm 3 to 4 times daily)  hypertension, obesity, PE, short gut syndrome, hypothyroidism, cholelithiasis, admitted with increasing confusion.  Patient lives at home with her son.  According to son she has been acting confused  Son noticed that she is more weak and confused than her baseline she had difficulty getting dressed and getting out of the house. Patient was recently admitted to the hospital 09/30/2022 discharged on 10/12/2022. Recent hospital admission she had sepsis secondary to Pseudomonas UTI and bacteremia.  Blood culture grew ESBL Pseudomonas treated with meropenem completed on 10/11/2022.  Follow-up blood cultures were negative.  She had vaginal bleeding she was started on Megace she was due to follow-up with gynecologist later this month.  Son tells me she was also is due to see infectious disease on 10/17/2022 but has not seen ended up in the hospital. Denies having sleep apnea.  Denies urinary complaints. She has some rash on the center of her chest which apparently is a cat scratch from her cat. Son reported she bites her tongue at times no history of seizures or incontinence reported ED Course: TSH 4.5 normal white count ammonia level 81 vital signs stable ABG unremarkable, CT head no acute intracranial abnormality.  Hypodensity through the right cerebellum artifact likely.   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 Discussed with the patient, she reported improvement in her back pain after the prior kyphoplasty, IR evaluation requested  -Appreciate IR evaluation, blood cultures from 10/8 while Staph epidermidis, 1 set likely contaminant.   -Blood cultures 10/11 so far negative -Discussed with IR, tentative kyphoplasty on 10/27/2022    Iron deficiency anemia, ?  Hematemesis, concern for variceal bleeding -Placed on octreotide, PPI, GI consulted.    -EGD on 10/9 showed normal esophagus, friable gastric mucosa, portal hypertensive gastropathy, normal duodenum.  No active bleeding -Patient's son did not think she had hematemesis but had dried blood from  the bitten tongue day before -h/h stable, 8.3  Chronic hypotension -Continue midodrine  Hypothyroidism -Continue Synthroid   Vaginal bleeding -Continue Megace 40 mg twice daily, has appointment with GYN in 11/03/2022     Obesity Estimated body mass index is 40.36 kg/m as calculated from the following:   Height as of this encounter: 5\' 2"  (1.575 m).   Weight as of this encounter: 100.1 kg.  Code Status: DNR are DVT Prophylaxis:  SCDs Start: 10/17/22 1759   Level of Care: Level of care: Progressive Family Communication: Updated patient Disposition Plan:      Remains inpatient appropriate:       Procedures:  EGD 10/9  Consultants:   GI  Antimicrobials:   Anti-infectives (From admission, onward)    Start     Dose/Rate Route Frequency Ordered Stop   10/18/22 0030  cefTRIAXone (ROCEPHIN) 1 g in sodium chloride 0.9 % 100 mL IVPB        1 g 200 mL/hr over 30 Minutes Intravenous  Once 10/18/22 0022 10/18/22 0145   10/17/22 2200  rifaximin (XIFAXAN) tablet 550 mg        550 mg Oral 2 times daily 10/17/22 1802            Medications  ciprofloxacin-fluocinolone PF  0.25 mL Right EAR BID   furosemide  40 mg Oral Daily   lactulose  20 g Oral TID   levothyroxine  150 mcg Oral QAC breakfast   megestrol  40 mg Oral BID   midodrine  10 mg Oral TID WC   pantoprazole  40 mg Oral Daily   potassium chloride  20 mEq Oral Daily   prochlorperazine  5 mg Intravenous Once   rifaximin  550 mg Oral BID   rOPINIRole  0.25 mg Oral QHS   spironolactone  25 mg Oral Daily   tamsulosin  0.4 mg Oral Daily   venlafaxine XR  75 mg Oral Q breakfast      Subjective:   [name] was seen and examined today.  Continues to complain of upper back pain, alert and oriented, complaining of right ear pain today.  No fevers or chills, no nausea vomiting.  Multiple BMs yesterday on lactulose.   Objective:   Vitals:   10/24/22 2102 10/25/22 0329 10/25/22 0902 10/25/22 1202  BP: 129/62 126/65 107/64 (!) 108/51  Pulse: 72 91 84 79  Resp: 20 18 19 14   Temp: 98.5 F (36.9 C) 98.5 F (36.9 C) 98.3 F (36.8 C) 98.6 F (37 C)  TempSrc: Oral Oral Oral Oral  SpO2: 100% 97% 100% 98%  Weight:      Height:        Intake/Output Summary (Last 24 hours) at 10/25/2022 1259 Last data filed at 10/24/2022 2000 Gross per 24 hour  Intake 240 ml  Output --  Net 240 ml     Wt Readings from Last 3 Encounters:  10/22/22 100.1 kg  10/10/22 100.7 kg  09/06/22 (!) 144.3 kg   Physical Exam General: Alert and oriented x 3, NAD Cardiovascular: S1 S2 clear, RRR.  Respiratory: CTAB, no  wheezing Gastrointestinal: Soft, nontender, nondistended, NBS Ext: no pedal edema bilaterally Neuro: no new deficits Psych: Normal affect   Data Reviewed:  I have personally reviewed following labs    CBC Lab Results  Component Value Date   WBC 5.1 10/24/2022   RBC 2.75 (L) 10/24/2022   HGB 8.3 (L) 10/24/2022   HCT 27.2 (L) 10/24/2022   MCV 98.9 10/24/2022  MCH 30.2 10/24/2022   PLT 119 (L) 10/24/2022   MCHC 30.5 10/24/2022   RDW 15.6 (H) 10/24/2022   LYMPHSABS 1.6 10/18/2022   MONOABS 0.5 10/18/2022   EOSABS 0.4 10/18/2022   BASOSABS 0.0 10/18/2022     Last metabolic panel Lab Results  Component Value Date   NA 140 10/24/2022   K 3.6 10/24/2022   CL 116 (H) 10/24/2022   CO2 19 (L) 10/24/2022   BUN 14 10/24/2022   CREATININE 0.94 10/24/2022   GLUCOSE 113 (H) 10/24/2022   GFRNONAA >60 10/24/2022   GFRAA >60 01/19/2019   CALCIUM 8.0 (L) 10/24/2022   PHOS 2.9 10/05/2022   PROT 6.0 (L) 10/18/2022   ALBUMIN 2.4 (L) 10/18/2022   BILITOT 2.2 (H) 10/18/2022   ALKPHOS 103 10/18/2022   AST 44 (H) 10/18/2022   ALT 26 10/18/2022   ANIONGAP 5 10/24/2022    CBG (last 3)  No results for input(s): "GLUCAP" in the last 72 hours.     Coagulation Profile: Recent Labs  Lab 10/22/22 0400  INR 1.8*     Radiology Studies: I have personally reviewed the imaging studies  No results found.     Thad Ranger M.D. [facility] 10/25/2022, 12:59 PM  Available via Epic secure chat 7am-7pm After 7 pm, please refer to night coverage provider listed on amion.

## 2022-10-25 NOTE — Plan of Care (Signed)
  Problem: Fluid Volume: Goal: Hemodynamic stability will improve Outcome: Progressing   Problem: Clinical Measurements: Goal: Diagnostic test results will improve Outcome: Progressing Goal: Signs and symptoms of infection will decrease Outcome: Progressing   Problem: Education: Goal: Knowledge of General Education information will improve Description: Including pain rating scale, medication(s)/side effects and non-pharmacologic comfort measures Outcome: Progressing   Problem: Health Behavior/Discharge Planning: Goal: Ability to manage health-related needs will improve Outcome: Progressing   Problem: Clinical Measurements: Goal: Ability to maintain clinical measurements within normal limits will improve Outcome: Progressing Goal: Will remain free from infection Outcome: Progressing Goal: Diagnostic test results will improve Outcome: Progressing   Problem: Nutrition: Goal: Adequate nutrition will be maintained Outcome: Progressing   Problem: Pain Managment: Goal: General experience of comfort will improve Outcome: Progressing   Problem: Safety: Goal: Ability to remain free from injury will improve Outcome: Progressing   Problem: Skin Integrity: Goal: Risk for impaired skin integrity will decrease Outcome: Progressing   Problem: Respiratory: Goal: Ability to maintain adequate ventilation will improve Outcome: Adequate for Discharge   Problem: Clinical Measurements: Goal: Respiratory complications will improve Outcome: Adequate for Discharge Goal: Cardiovascular complication will be avoided Outcome: Adequate for Discharge   Problem: Coping: Goal: Level of anxiety will decrease Outcome: Adequate for Discharge   Problem: Elimination: Goal: Will not experience complications related to bowel motility Outcome: Adequate for Discharge Goal: Will not experience complications related to urinary retention Outcome: Adequate for Discharge

## 2022-10-26 DIAGNOSIS — K7682 Hepatic encephalopathy: Secondary | ICD-10-CM | POA: Diagnosis not present

## 2022-10-26 LAB — CULTURE, BLOOD (ROUTINE X 2)
Culture: NO GROWTH
Culture: NO GROWTH
Special Requests: ADEQUATE
Special Requests: ADEQUATE

## 2022-10-26 LAB — CBC
HCT: 27 % — ABNORMAL LOW (ref 36.0–46.0)
Hemoglobin: 8.3 g/dL — ABNORMAL LOW (ref 12.0–15.0)
MCH: 29.9 pg (ref 26.0–34.0)
MCHC: 30.7 g/dL (ref 30.0–36.0)
MCV: 97.1 fL (ref 80.0–100.0)
Platelets: 103 10*3/uL — ABNORMAL LOW (ref 150–400)
RBC: 2.78 MIL/uL — ABNORMAL LOW (ref 3.87–5.11)
RDW: 15.1 % (ref 11.5–15.5)
WBC: 4.2 10*3/uL (ref 4.0–10.5)
nRBC: 0 % (ref 0.0–0.2)

## 2022-10-26 LAB — BASIC METABOLIC PANEL
Anion gap: 7 (ref 5–15)
BUN: 13 mg/dL (ref 8–23)
CO2: 21 mmol/L — ABNORMAL LOW (ref 22–32)
Calcium: 8.2 mg/dL — ABNORMAL LOW (ref 8.9–10.3)
Chloride: 109 mmol/L (ref 98–111)
Creatinine, Ser: 0.78 mg/dL (ref 0.44–1.00)
GFR, Estimated: >60 mL/min (ref >60)
Glucose, Bld: 104 mg/dL — ABNORMAL HIGH (ref 70–99)
Potassium: 3.8 mmol/L (ref 3.5–5.1)
Sodium: 137 mmol/L (ref 135–145)

## 2022-10-26 LAB — PROTIME-INR
INR: 1.7 — ABNORMAL HIGH (ref 0.8–1.2)
Prothrombin Time: 20.2 s — ABNORMAL HIGH (ref 11.4–15.2)

## 2022-10-26 LAB — AMMONIA: Ammonia: 60 umol/L — ABNORMAL HIGH (ref 9–35)

## 2022-10-26 MED ORDER — PREDNISONE 50 MG PO TABS
50.0000 mg | ORAL_TABLET | Freq: Four times a day (QID) | ORAL | Status: AC
  Administered 2022-10-26 – 2022-10-27 (×3): 50 mg via ORAL
  Filled 2022-10-26 (×3): qty 1

## 2022-10-26 MED ORDER — ACETAMINOPHEN 500 MG PO TABS
500.0000 mg | ORAL_TABLET | Freq: Four times a day (QID) | ORAL | Status: DC | PRN
  Filled 2022-10-26: qty 1

## 2022-10-26 MED ORDER — VANCOMYCIN HCL 1500 MG/300ML IV SOLN
1500.0000 mg | Freq: Once | INTRAVENOUS | Status: AC
  Administered 2022-10-27: 1500 mg via INTRAVENOUS
  Filled 2022-10-26: qty 300

## 2022-10-26 MED ORDER — OXYCODONE HCL 5 MG PO TABS
5.0000 mg | ORAL_TABLET | ORAL | Status: DC | PRN
  Administered 2022-10-26 – 2022-10-29 (×12): 5 mg via ORAL
  Filled 2022-10-26 (×12): qty 1

## 2022-10-26 MED ORDER — DIPHENHYDRAMINE HCL 25 MG PO CAPS
50.0000 mg | ORAL_CAPSULE | Freq: Once | ORAL | Status: AC
  Administered 2022-10-27: 50 mg via ORAL
  Filled 2022-10-26: qty 2

## 2022-10-26 MED ORDER — PHYTONADIONE 5 MG PO TABS
5.0000 mg | ORAL_TABLET | Freq: Once | ORAL | Status: AC
  Administered 2022-10-26: 5 mg via ORAL
  Filled 2022-10-26: qty 1

## 2022-10-26 NOTE — Progress Notes (Signed)
[facility]                                                                              [name], is a 67 y.o. female, DOB - 1955/12/07, WGN:[id number] Admit date - 10/17/2022    Outpatient Primary MD for the patient is [name], MD  LOS - 9  days  Chief Complaint  Patient presents with   Weakness   Altered Mental Status       Brief summary   Patient is a 67 year old female with known alcoholic fatty liver disease, chronic diarrhea,(takes lactulose only if she does not move bm 3 to 4 times daily)  hypertension, obesity, PE, short gut syndrome, hypothyroidism, cholelithiasis, admitted with increasing confusion.  Patient lives at home with her son.  According to son she has been acting confused  Son noticed that she is more weak and confused than her baseline she had difficulty getting dressed and getting out of the house. Patient was recently admitted to the hospital 09/30/2022 discharged on 10/12/2022. Recent hospital admission she had sepsis secondary to Pseudomonas UTI and bacteremia.  Blood culture grew ESBL Pseudomonas treated with meropenem completed on 10/11/2022.  Follow-up blood cultures were negative.  She had vaginal bleeding she was started on Megace she was due to follow-up with gynecologist later this month.  Son tells me she was also is due to see infectious disease on 10/17/2022 but has not seen ended up in the hospital. Denies having sleep apnea.  Denies urinary complaints. She has some rash on the center of her chest which apparently is a cat scratch from her cat. Son reported she bites her tongue at times no history of seizures or incontinence reported ED Course: TSH 4.5 normal white count ammonia level 81 vital signs stable ABG unremarkable, CT head no acute intracranial abnormality.  Hypodensity through the right cerebellum artifact likely.   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 Discussed with the patient, she reported improvement in her back pain after the prior kyphoplasty, IR evaluation requested  -Appreciate IR evaluation, blood cultures from 10/8 while Staph epidermidis, 1 set likely contaminant.   -Blood cultures 10/11 so far negative -Discussed with IR, tentative kyphoplasty on 10/27/2022 Check INR tomorrow.  Currently receiving vitamin K.    Iron deficiency anemia, ?  Hematemesis, concern for variceal bleeding -Placed on octreotide, PPI, GI consulted.    -EGD on 10/9 showed normal esophagus, friable gastric mucosa, portal hypertensive gastropathy, normal duodenum.  No active bleeding -Patient's son did not think she had hematemesis but had dried blood from the bitten  tongue day before -h/h stable  Chronic hypotension -Continue midodrine  Hypothyroidism -Continue Synthroid   Vaginal bleeding -Continue Megace 40 mg twice daily, has appointment with GYN in 11/03/2022    Headache. Okay to take Tylenol. Continue as needed pain medication.  Obesity Class 3 Body mass index is 40.36 kg/m.  Placing the pt at higher risk of poor outcomes.  Code Status: DNR  DVT Prophylaxis:  SCDs Start: 10/17/22 1759   Level of Care: Level of care: Progressive Family Communication: Updated patient Disposition Plan:      Remains inpatient appropriate:       Procedures:  EGD 10/9  Consultants:   GI  Antimicrobials:   Anti-infectives (From admission, onward)    Start     Dose/Rate Route Frequency Ordered Stop   10/27/22 1000  vancomycin (VANCOREADY) IVPB 1500 mg/300 mL        1,500 mg 150 mL/hr over 120 Minutes Intravenous  Once 10/26/22 1046     10/18/22 0030  cefTRIAXone (ROCEPHIN) 1 g in sodium chloride 0.9 % 100 mL IVPB        1 g 200 mL/hr over 30 Minutes Intravenous  Once 10/18/22 0022 10/18/22 0145   10/17/22 2200  rifaximin (XIFAXAN) tablet 550 mg        550 mg Oral 2 times daily 10/17/22 1802            Medications  ciprofloxacin-dexamethasone  4 drop Right EAR BID   [START ON 10/27/2022] diphenhydrAMINE  50 mg Oral Once   furosemide  40 mg Oral Daily   lactulose  20 g Oral TID   levothyroxine  150 mcg Oral QAC breakfast   megestrol  40 mg Oral BID   midodrine  10 mg Oral TID WC   pantoprazole  40 mg Oral Daily   potassium chloride  20 mEq Oral Daily   predniSONE  50 mg Oral Q6H   prochlorperazine  5 mg Intravenous Once   rifaximin  550 mg Oral BID   rOPINIRole  0.25 mg Oral QHS   spironolactone  25 mg Oral Daily   tamsulosin  0.4 mg Oral Daily   venlafaxine XR  75 mg Oral Q breakfast      Subjective:   No acute complaint.  No nausea no vomiting.  Chronic pain.  No fever no chills.  Continues to have loose BM with lactulose.  No blood in the stool.  Objective:   Vitals:   10/25/22 1202 10/25/22 2041 10/26/22 0527 10/26/22 1215  BP: (!) 108/51 (!) 107/58 118/61 (!) 102/53  Pulse: 79 76 79 82  Resp: 14 19 (!) 22 16  Temp: 98.6 F (37 C) 98.4 F (36.9 C) 98.4 F (36.9 C) 97.8 F (36.6 C)  TempSrc: Oral Oral Oral Oral  SpO2: 98% 100% 100% 98%  Weight:      Height:        Intake/Output Summary (Last 24 hours) at 10/26/2022 1903 Last data filed at 10/26/2022 1700 Gross per 24 hour  Intake 120 ml  Output 1300 ml  Net -1180 ml     Wt Readings from Last 3 Encounters:  10/22/22 100.1 kg   10/10/22 100.7 kg  09/06/22 (!) 144.3 kg   Physical Exam Clear to auscultation. S1-S2 present. No asterixis. Alert awake and oriented x 3. No significant edema.  Data Reviewed:  I have personally reviewed following labs    CBC Lab Results  Component Value Date   WBC 4.2 10/26/2022   RBC 2.78 (  L) 10/26/2022   HGB 8.3 (L) 10/26/2022   HCT 27.0 (L) 10/26/2022   MCV 97.1 10/26/2022   MCH 29.9 10/26/2022   PLT 103 (L) 10/26/2022   MCHC 30.7 10/26/2022   RDW 15.1 10/26/2022   LYMPHSABS 1.6 10/18/2022   MONOABS 0.5 10/18/2022   EOSABS 0.4 10/18/2022   BASOSABS 0.0 10/18/2022     Last metabolic panel Lab Results  Component Value Date   NA 137 10/26/2022   K 3.8 10/26/2022   CL 109 10/26/2022   CO2 21 (L) 10/26/2022   BUN 13 10/26/2022   CREATININE 0.78 10/26/2022   GLUCOSE 104 (H) 10/26/2022   GFRNONAA >60 10/26/2022   GFRAA >60 01/19/2019   CALCIUM 8.2 (L) 10/26/2022   PHOS 2.9 10/05/2022   PROT 6.0 (L) 10/18/2022   ALBUMIN 2.4 (L) 10/18/2022   BILITOT 2.2 (H) 10/18/2022   ALKPHOS 103 10/18/2022   AST 44 (H) 10/18/2022   ALT 26 10/18/2022   ANIONGAP 7 10/26/2022    CBG (last 3)  No results for input(s): "GLUCAP" in the last 72 hours.     Coagulation Profile: Recent Labs  Lab 10/22/22 0400 10/26/22 1145  INR 1.8* 1.7*     Radiology Studies: I have personally reviewed the imaging studies  No results found.     Lynden Oxford M.D. [facility] 10/26/2022, 7:03 PM  Available via Epic secure chat 7am-7pm After 7 pm, please refer to night coverage provider listed on amion.

## 2022-10-26 NOTE — Progress Notes (Signed)
Mobility Specialist - Progress Note   10/26/22 1013  Mobility  Activity Ambulated with assistance in hallway  Level of Assistance Standby assist, set-up cues, supervision of patient - no hands on  Assistive Device Front wheel walker  Distance Ambulated (ft) 140 ft  Range of Motion/Exercises Active  Activity Response Tolerated well  Mobility Referral Yes  $Mobility charge 1 Mobility  Mobility Specialist Start Time (ACUTE ONLY) [id number]  Mobility Specialist Stop Time (ACUTE ONLY) 0948  Mobility Specialist Time Calculation (min) (ACUTE ONLY) 25 min   Pt received in bed and agreed to mobility, had no issues throughout session and returned to bed with all needs met.  Marilynne Halsted Mobility Specialist

## 2022-10-26 NOTE — Progress Notes (Signed)
Referring Physician(s): Rai,R  Supervising Physician: Marliss Coots  Patient Status:  [facility] - In-pt  Chief Complaint:  Neck/mid back pain  Subjective: Pt continues to have neck/mid back pain, worse with movement; occ vaginal bleeding; denies fever,HA,CP,worsening dyspnea, cough,N/V ; she is on IR schedule 10/17 for T12 KP due to persistent mid back pain despite conservative measures(oxycodone, robaxin); she states her mid back pain is worse than neck pain and there is some radiation to ant abd region   Past Medical History:  Diagnosis Date   Chronic diarrhea    Cirrhosis, 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   pt stated on admission history review   Heart murmur    Hypertension    Kidney stone    Obesity    Pulmonary embolism (HCC)    Short gut syndrome    Thyroid disease    Past Surgical History:  Procedure Laterality Date   CESAREAN SECTION WITH BILATERAL TUBAL LIGATION     CHOLECYSTECTOMY     COLON SURGERY     ESOPHAGOGASTRODUODENOSCOPY (EGD) WITH PROPOFOL N/A 10/19/2022   Procedure: ESOPHAGOGASTRODUODENOSCOPY (EGD) WITH PROPOFOL;  Surgeon: Kerin Salen, MD;  Location: [facility] ENDOSCOPY;  Service: Gastroenterology;  Laterality: N/A;   HERNIA REPAIR     ILEOSTOMY     ILEOSTOMY CLOSURE     IR KYPHO THORACIC WITH BONE BIOPSY  08/31/2022   IR RADIOLOGIST EVAL & MGMT  09/27/2022   KIDNEY STONE SURGERY     WRIST SURGERY        Allergies: Ace inhibitors, Ivp dye [iodinated contrast media], Desitin [zinc oxide], Hydroxyzine, Chlorhexidine, Doxycycline, Penicillins, and Zofran [ondansetron]  Medications: Prior to Admission medications   Medication Sig Start Date End Date Taking? Authorizing Provider  atorvastatin (LIPITOR) 40 MG tablet Take 40 mg by mouth in the morning. 01/19/22  Yes [provider]  ciclopirox (PENLAC) 8 % solution Apply 1 application  topically See admin instructions. Apply over all toenails and surrounding skin at bedtime. Apply daily over previous  coat. After seven (7) days, may remove with alcohol and continue cycle.   Yes [provider]  colchicine 0.6 MG tablet Take 0.6 mg by mouth daily as needed (gout flares).   Yes [provider]  cyanocobalamin (VITAMIN B12) 1000 MCG tablet Take 2,000 mcg by mouth every Friday.   Yes [provider]  dicyclomine (BENTYL) 10 MG capsule Take 1 capsule (10 mg total) by mouth 3 (three) times daily as needed for spasms (abdominal cramping). 09/09/22  Yes Lewie Chamber, MD  EPINEPHrine 0.3 mg/0.3 mL IJ SOAJ injection Inject 0.3 mg into the muscle as needed for anaphylaxis. 08/07/19  Yes [provider]  folic acid (FOLVITE) 1 MG tablet Take 1 tablet (1 mg total) by mouth daily. 06/16/22  Yes Calvert Cantor, MD  furosemide (LASIX) 40 MG tablet Take 1 tablet (40 mg total) by mouth 2 (two) times daily. 09/09/22  Yes Lewie Chamber, MD  HYDROcodone-acetaminophen (NORCO) 10-325 MG tablet Take 1 tablet by mouth 3 (three) times daily as needed for moderate pain or severe pain. 02/25/22  Yes [provider]  IRON-VITAMIN C PO Take 1 tablet by mouth daily with breakfast.   Yes [provider]  lactulose (CHRONULAC) 10 GM/15ML solution Take 30 mLs (20 g total) by mouth 3 (three) times daily. Patient taking differently: Take 20 g by mouth 3 (three) times daily as needed for mild constipation or moderate constipation. 09/09/22  Yes Lewie Chamber, MD  levothyroxine (SYNTHROID) 150 MCG tablet Take 150  mcg by mouth daily before breakfast. 10/26/21  Yes [provider]  magnesium gluconate (MAGONATE) 500 MG tablet Take 1,000 mg by mouth daily.   Yes [provider]  megestrol (MEGACE) 40 MG tablet Take 1 tablet (40 mg total) by mouth 2 (two) times daily. 10/11/22 11/10/22 Yes Carollee Herter, DO  methocarbamol (ROBAXIN) 500 MG tablet Take 500 mg by mouth in the morning. 03/04/22  Yes [provider]  midodrine (PROAMATINE) 10 MG tablet Take 1 tablet (10 mg  total) by mouth 3 (three) times daily with meals. 09/09/22  Yes Lewie Chamber, MD  naloxone [facility]) nasal spray 4 mg/0.1 mL Place 1 spray into the nose as needed (accidental overdose). 03/25/22  Yes [provider]  nystatin cream (MYCOSTATIN) Apply topically 2 (two) times daily. Patient taking differently: Apply 1 Application topically 2 (two) times daily as needed for dry skin (or irritation). 05/22/22  Yes Leatha Gilding, MD  nystatin powder Apply 1 Application topically 3 (three) times daily as needed (for irritation- affected areas).   Yes [provider]  pantoprazole (PROTONIX) 40 MG tablet Take 1 tablet (40 mg total) by mouth 2 (two) times daily before a meal. 09/09/22  Yes Lewie Chamber, MD  potassium chloride (KLOR-CON) 10 MEQ tablet Take 20 mEq by mouth in the morning and at bedtime.   Yes [provider]  promethazine (PHENERGAN) 12.5 MG tablet Take 12.5 mg by mouth every 6 (six) hours as needed for nausea or vomiting.   Yes [provider]  rifaximin (XIFAXAN) 550 MG TABS tablet Take 550 mg by mouth 2 (two) times daily.   Yes [provider]  rOPINIRole (REQUIP) 0.25 MG tablet Take 1 tablet (0.25 mg total) by mouth at bedtime. 09/09/22  Yes Lewie Chamber, MD  spironolactone (ALDACTONE) 25 MG tablet Take 1 tablet (25 mg total) by mouth daily. 09/10/22  Yes Lewie Chamber, MD  tamsulosin (FLOMAX) 0.4 MG CAPS capsule Take 1 capsule (0.4 mg total) by mouth daily. 09/10/22  Yes Lewie Chamber, MD  triamcinolone cream (KENALOG) 0.5 % Apply to affected area of right breast twice daily as directed. Patient taking differently: Apply 1 Application topically 2 (two) times daily as needed (for irritation- right breast). 05/10/22  Yes Osvaldo Shipper, MD  TYLENOL 500 MG tablet Take 500 mg by mouth every 6 (six) hours as needed for mild pain or headache.   Yes [provider]  venlafaxine XR (EFFEXOR-XR) 75 MG 24 hr capsule Take 75 mg by mouth daily  with breakfast. 08/17/21  Yes [provider]  Vitamin D, Ergocalciferol, (DRISDOL) 50000 UNITS CAPS Take 50,000 Units by mouth every Wednesday.   Yes [provider]  hydrOXYzine (ATARAX/VISTARIL) 25 MG tablet Take 12.5 mg by mouth 3 (three) times daily as needed for itching. Patient not taking: Reported on 10/17/2022    [provider]     Vital Signs: BP 118/61 (BP Location: Left Arm)   Pulse 79   Temp 98.4 F (36.9 C) (Oral)   Resp (!) 22   Ht 5\' 2"  (1.575 m)   Wt 220 lb 10.9 oz (100.1 kg)   SpO2 100%   BMI 40.36 kg/m   Physical Exam: awake/answers questions appropriately; son in room; chest- sl dim BS bases; heart- RRR; abd-obese, soft,+BS, some mild gen tenderness to palpation; mod paravertebral tenderness T12 region; np pedal edema; blind in left eye  Imaging: No results found.  Labs:  CBC: Recent Labs    10/22/22 0400 10/23/22  0422 10/24/22 0356 10/26/22 0347  WBC 4.8 5.4 5.1 4.2  HGB 8.1* 8.0* 8.3* 8.3*  HCT 26.4* 26.3* 27.2* 27.0*  PLT 132* 135* 119* 103*    COAGS: Recent Labs    06/14/22 0505 08/11/22 0730 08/12/22 0325 08/12/22 1512 08/13/22 0335 08/14/22 1316 08/15/22 0552 09/09/22 0606 09/30/22 0800 10/17/22 1425 10/22/22 0400  INR 3.2* 3.4*   < > 3.4*   < > 2.1*   < > 1.7* 1.6* 1.6* 1.8*  APTT 53* 49*  --  53*  --  45*  --   --   --   --   --    < > = values in this interval not displayed.    BMP: Recent Labs    10/22/22 0400 10/23/22 0422 10/24/22 0356 10/26/22 0347  NA 140 140 140 137  K 3.7 3.6 3.6 3.8  CL 112* 115* 116* 109  CO2 21* 19* 19* 21*  GLUCOSE 118* 100* 113* 104*  BUN 16 15 14 13   CALCIUM 8.1* 8.0* 8.0* 8.2*  CREATININE 0.98 0.98 0.94 0.78  GFRNONAA >60 >60 >60 >60    LIVER FUNCTION TESTS: Recent Labs    10/08/22 0717 10/09/22 0709 10/17/22 1205 10/18/22 0525  BILITOT 1.2 1.4* 1.9* 2.2*  AST 66* 62* 35 44*  ALT 42 40 28 26  ALKPHOS 99 101 108 103  PROT 5.2* 5.1* 5.9* 6.0*   ALBUMIN 2.0* 1.9* 2.4* 2.4*    Assessment and Plan: 67 y.o. 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  Recent imaging  revealed acute compression fractures of T3 and T5 with marrow edema with moderate height loss at T3 and mild to moderate height loss at T5 ; s/p recent workup for KP at these sites, however Dr. Quay Burow noted that fluoroscopic images of the thoracic spine at time of procedure did not provide adequate image resolution to safely perform vertebral augmentation and case was cancelled; pt now with new symptomatic T12 fracture; request again received for consideration of T12 KP; latest images were reviewed by Dr.Suttle and pt appears to be candidate for procedure; will tent plan case for 10/17 and premedicate secondary to remote hx contrast allergy; Risks and benefits of procedure were discussed with the patient/son including, but not limited to education regarding the natural healing process of compression fractures without intervention, bleeding, infection, cement migration which may cause spinal cord damage, paralysis, pulmonary embolism or even death or inability to perform procedure.   This interventional procedure involves the use of X-rays and because of the nature of the planned procedure, it is possible that we will have prolonged use of X-ray fluoroscopy.  Potential radiation risks to you include (but are not limited to) the following: - A slightly elevated risk for cancer  several years later in life. This risk is typically less than 0.5%  percent. This risk is low in comparison to the normal incidence of human cancer, which is 33% for women and 50% for men according to the [employer]. - Radiation induced injury can include skin redness, resembling a rash, tissue breakdown / ulcers and hair loss (which can be temporary or permanent).   The likelihood of either  of these occurring depends on the difficulty of the procedure and whether you are sensitive to radiation due to previous procedures, disease, or genetic conditions.   IF your procedure requires a prolonged use of radiation, you will be notified and given written instructions for further action.  It is your responsibility to monitor the irradiated area for the 2 weeks following the procedure and to notify your physician if you are concerned that you have suffered a radiation induced injury.    All of the patient's questions were answered, patient is agreeable to proceed.  Consent signed and in chart.  Pt afebrile, WBC nl, hgb stable, plts 103k, creat nl; PT 21.4/INR 1.8- recheck in am; latest blood cx neg    Electronically Signed: D. Jeananne Rama, PA-C 10/26/2022, 9:32 AM   I spent a total of 25 Minutes at the the patient's bedside AND on the patient's hospital floor or unit, greater than 50% of which was counseling/coordinating care for thoracic 12 kyphoplasty    Patient ID: [name], female   DOB: 12-25-1955, 67 y.o.   MRN: [id number]

## 2022-10-26 NOTE — Plan of Care (Signed)

## 2022-10-27 ENCOUNTER — Inpatient Hospital Stay (HOSPITAL_COMMUNITY): Payer: Medicare (Managed Care)

## 2022-10-27 DIAGNOSIS — K7682 Hepatic encephalopathy: Secondary | ICD-10-CM | POA: Diagnosis not present

## 2022-10-27 HISTORY — PX: IR KYPHO THORACIC WITH BONE BIOPSY: IMG5518

## 2022-10-27 HISTORY — PX: IR KYPHO EA ADDL LEVEL THORACIC OR LUMBAR: IMG5520

## 2022-10-27 LAB — PROTIME-INR
INR: 1.6 — ABNORMAL HIGH (ref 0.8–1.2)
Prothrombin Time: 19.1 s — ABNORMAL HIGH (ref 11.4–15.2)

## 2022-10-27 MED ORDER — IOHEXOL 300 MG/ML  SOLN: 50.0000 mL | Freq: Once | INTRAMUSCULAR | Status: DC | PRN

## 2022-10-27 MED ORDER — VITAMIN K1 10 MG/ML IJ SOLN
1.0000 mg | Freq: Once | INTRAVENOUS | Status: AC
  Administered 2022-10-27: 1 mg via INTRAVENOUS
  Filled 2022-10-27: qty 0.1

## 2022-10-27 MED ORDER — MIDAZOLAM HCL 2 MG/2ML IJ SOLN
INTRAMUSCULAR | Status: AC | PRN
Start: 2022-10-27 — End: 2022-10-27
  Administered 2022-10-27: .5 mg via INTRAVENOUS
  Administered 2022-10-27: 1 mg via INTRAVENOUS
  Administered 2022-10-27: .5 mg via INTRAVENOUS

## 2022-10-27 MED ORDER — MIDAZOLAM HCL 2 MG/2ML IJ SOLN
INTRAMUSCULAR | Status: AC
  Filled 2022-10-27: qty 4

## 2022-10-27 MED ORDER — FENTANYL CITRATE (PF) 100 MCG/2ML IJ SOLN
INTRAMUSCULAR | Status: AC | PRN
Start: 2022-10-27 — End: 2022-10-27
  Administered 2022-10-27: 50 ug via INTRAVENOUS
  Administered 2022-10-27 (×2): 25 ug via INTRAVENOUS

## 2022-10-27 MED ORDER — LIDOCAINE HCL (PF) 1 % IJ SOLN
30.0000 mL | Freq: Once | INTRAMUSCULAR | Status: AC
  Filled 2022-10-27: qty 30

## 2022-10-27 MED ORDER — FUROSEMIDE 40 MG PO TABS: 40.0000 mg | ORAL_TABLET | Freq: Two times a day (BID) | ORAL | Status: DC

## 2022-10-27 MED ORDER — LIDOCAINE HCL (PF) 1 % IJ SOLN
INTRAMUSCULAR | Status: AC
  Filled 2022-10-27: qty 30

## 2022-10-27 MED ORDER — FENTANYL CITRATE (PF) 100 MCG/2ML IJ SOLN
INTRAMUSCULAR | Status: AC
  Filled 2022-10-27: qty 2

## 2022-10-27 NOTE — Progress Notes (Signed)
[facility]                                                                              [name], is a 67 y.o. female, DOB - 07/27/1955, PPI:[id number] Admit date - 10/17/2022    Outpatient Primary MD for the patient is [name], MD  LOS - 10  days  Chief Complaint  Patient presents with   Weakness   Altered Mental Status       Brief summary   Patient is a 67 year old female with known alcoholic fatty liver disease, chronic diarrhea,(takes lactulose only if she does not move bm 3 to 4 times daily)  hypertension, obesity, PE, short gut syndrome, hypothyroidism, cholelithiasis, admitted with increasing confusion.  Patient lives at home with her son.  According to son she has been acting confused  Son noticed that she is more weak and confused than her baseline she had difficulty getting dressed and getting out of the house. Patient was recently admitted to the hospital 09/30/2022 discharged on 10/12/2022. Recent hospital admission she had sepsis secondary to Pseudomonas UTI and bacteremia.  Blood culture grew ESBL Pseudomonas treated with meropenem completed on 10/11/2022.  Follow-up blood cultures were negative.  She had vaginal bleeding she was started on Megace she was due to follow-up with gynecologist later this month.  Son tells me she was also is due to see infectious disease on 10/17/2022 but has not seen ended up in the hospital. Denies having sleep apnea.  Denies urinary complaints. She has some rash on the center of her chest which apparently is a cat scratch from her cat. Son reported she bites her tongue at times no history of seizures or incontinence reported ED Course: TSH 4.5 normal white count ammonia level 81 vital signs stable ABG unremarkable, CT head no acute intracranial abnormality.  Hypodensity through the right cerebellum artifact likely.   Assessment & Plan  Acute metabolic encephalopathy secondary to hepatic encephalopathy (HCC) -MRI  brain 10/7 showed no acute intracranial abnormality, moderately advanced chronic microvascular ischemic disease Ammonia on admission 169. Improving. Will recheck tomorrow. Mentation significantly better.  Pretty close to baseline. 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 Discussed with the patient, she reported improvement in her back pain after the prior kyphoplasty, IR evaluation requested  -Appreciate IR evaluation, blood cultures from 10/8 while Staph epidermidis, 1 set likely contaminant.   -Blood cultures 10/11 so far negative Underwent kyphoplasty on 10/17.  Monitor.    Iron deficiency anemia, ?  Hematemesis, concern for variceal bleeding -Placed on octreotide, PPI, GI consulted.    -EGD on 10/9 showed normal esophagus, friable gastric mucosa, portal hypertensive gastropathy, normal duodenum.  No active bleeding -h/h stable  Chronic hypotension -Continue midodrine  Hypothyroidism -Continue Synthroid   Vaginal bleeding -Continue Megace 40 mg twice daily, has  appointment with GYN in 11/03/2022    Headache. Okay to take Tylenol. Continue as needed pain medication.  Obesity Class 3 Body mass index is 40.36 kg/m.  Placing the pt at higher risk of poor outcomes.  Code Status: DNR  DVT Prophylaxis:  SCDs Start: 10/17/22 1759   Level of Care: Level of care: Progressive Family Communication: Family at bedside. Disposition Plan:      Remains inpatient appropriate:   Monitor for improvement in mentation  Procedures:  EGD 10/9  Consultants:   GI  Medications  ciprofloxacin-dexamethasone  4 drop  Right EAR BID   furosemide  40 mg Oral Daily   lactulose  20 g Oral TID   levothyroxine  150 mcg Oral QAC breakfast   megestrol  40 mg Oral BID   midodrine  10 mg Oral TID WC   pantoprazole  40 mg Oral Daily   potassium chloride  20 mEq Oral Daily   prochlorperazine  5 mg Intravenous Once   rifaximin  550 mg Oral BID   rOPINIRole  0.25 mg Oral QHS   spironolactone  25 mg Oral Daily   tamsulosin  0.4 mg Oral Daily   venlafaxine XR  75 mg Oral Q breakfast   Subjective:   Back pain improving.  No neck pain.  No headache. No nausea no vomiting. Oral intake improving.  Objective:   Vitals:   10/27/22 1220 10/27/22 1314 10/27/22 1404 10/27/22 1507  BP: 129/70 125/73 119/68 116/65  Pulse: 99 95 97 94  Resp: 14 20 20 20   Temp:  98.3 F (36.8 C) 98.3 F (36.8 C) 98.2 F (36.8 C)  TempSrc:  Oral Oral Oral  SpO2: 100% 98%    Weight:      Height:        Intake/Output Summary (Last 24 hours) at 10/27/2022 2021 Last data filed at 10/27/2022 1440 Gross per 24 hour  Intake 240 ml  Output --  Net 240 ml     Wt Readings from Last 3 Encounters:  10/22/22 100.1 kg  10/10/22 100.7 kg  09/06/22 (!) 144.3 kg   Physical Exam Basal crackles production S1-S2 present. No asterixis. Alert awake and oriented x 3.  Data Reviewed:  I have personally reviewed following labs    CBC Lab Results  Component Value Date   WBC 4.2 10/26/2022   RBC 2.78 (L) 10/26/2022   HGB 8.3 (L) 10/26/2022   HCT 27.0 (L) 10/26/2022   MCV 97.1 10/26/2022   MCH 29.9 10/26/2022   PLT 103 (L) 10/26/2022   MCHC 30.7 10/26/2022   RDW 15.1 10/26/2022   LYMPHSABS 1.6 10/18/2022   MONOABS 0.5 10/18/2022   EOSABS 0.4 10/18/2022   BASOSABS 0.0 10/18/2022     Last metabolic panel Lab Results  Component Value Date   NA 137 10/26/2022   K 3.8 10/26/2022   CL 109 10/26/2022   CO2 21 (L) 10/26/2022   BUN 13 10/26/2022   CREATININE 0.78 10/26/2022   GLUCOSE 104 (H) 10/26/2022   GFRNONAA >60  10/26/2022   GFRAA >60 01/19/2019   CALCIUM 8.2 (L) 10/26/2022   PHOS 2.9 10/05/2022   PROT 6.0 (L) 10/18/2022   ALBUMIN 2.4 (L) 10/18/2022   BILITOT 2.2 (H) 10/18/2022   ALKPHOS 103 10/18/2022   AST 44 (H) 10/18/2022   ALT 26 10/18/2022   ANIONGAP 7 10/26/2022    CBG (last 3)  No results for input(s): "GLUCAP" in the last 72 hours.     Coagulation Profile:  Recent Labs  Lab 10/22/22 0400 10/26/22 1145 10/27/22 0353  INR 1.8* 1.7* 1.6*     Radiology Studies: I have personally reviewed the imaging studies  IR KYPHO EA ADDL LEVEL THORACIC OR LUMBAR  Result Date: 10/27/2022 CLINICAL DATA:  67 year old female with acute osteoporotic vertebral compression fracture of T12 persistent back pain. EXAM: FLUOROSCOPIC GUIDED KYPHOPLASTY OF THE T12 VERTEBRAL BODY COMPARISON:  None Available. MEDICATIONS: The patient was receiving intravenous antibiotics as an inpatient, no additional antibiotics were administered. ANESTHESIA/SEDATION: Moderate (conscious) sedation was employed during this procedure. A total of Versed 2 mg and Fentanyl 100 mcg was administered intravenously. Moderate Sedation Time: 27 minutes. The patient's level of consciousness and vital signs were monitored continuously by radiology nursing throughout the procedure under my direct supervision. FLUOROSCOPY TIME:  Four hundred forty-two mGy COMPLICATIONS: None immediate. PROCEDURE: The procedure, risks (including but not limited to bleeding, infection, organ damage), benefits, and alternatives were explained to the patient. Questions regarding the procedure were encouraged and answered. The patient understands and consents to the procedure. The patient has suffered a fracture of the T12 vertebral body. It is recommended that patients aged 42 years or older be evaluated for possible testing or treatment of osteoporosis. A copy of this procedure report is sent to the patient's referring physician The patient was placed prone on the  fluoroscopic table. The skin overlying the lower thoracic region was then prepped and draped in the usual sterile fashion. Maximal barrier sterile technique was utilized including caps, mask, sterile gowns, sterile gloves, sterile drape, hand hygiene and skin antiseptic. Intravenous Fentanyl and Versed were administered as conscious sedation during continuous cardiorespiratory monitoring by the radiology RN. The left pedicle at T12 was then infiltrated with 1% lidocaine followed by the advancement of a Kyphon trocar needle through the left pedicle into the posterior one-third of the vertebral body. Subsequently, the osteo drill was advanced to the anterior third of the vertebral body. The osteo drill was retracted. Through the working cannula, a Kyphon inflatable bone tamp 15 x 3 was advanced and positioned with the distal marker approximately 5 mm from the anterior aspect of the cortex. Appropriate positioning was confirmed on the AP projection. At this time, the balloon was expanded using contrast via a Kyphon inflation syringe device via micro tubing. In similar fashion, the right T12 pedicle was infiltrated with 1% lidocaine followed by the advancement of a second Kyphon trocar needle through the right pedicle into the posterior third of the vertebral body. Subsequently, the osteo drill was coaxially advanced to the anterior right third. The osteo drill was exchanged for a Kyphon inflatable bone tamp 15 x 3, advanced to the 5 mm of the anterior aspect of the cortex. The balloon was then expanded using contrast as above. Inflations were continued until there was near apposition with the superior end plate. At this time, methylmethacrylate mixture was reconstituted in the Kyphon bone mixing device system. This was then loaded into the delivery mechanism, attached to Kyphon bone fillers. The balloons were deflated and removed followed by the instillation of methylmethacrylate mixture with excellent filling in the  AP and lateral projections. No extravasation was noted in the disk spaces or posteriorly into the spinal canal. No epidural venous contamination was seen. The working cannulae and the bone filler were then retrieved and removed. Hemostasis was achieved with manual compression. The patient tolerated the procedure well without immediate postprocedural complication. IMPRESSION: 1. Technically successful T12 vertebral body augmentation using balloon kyphoplasty. 2. Per CMS  PQRS reporting requirements (PQRS Measure 24): Given the patient's age of greater than 50 and the fracture site (hip, distal radius, or spine), the patient should be tested for osteoporosis using DXA, and the appropriate treatment considered based on the DXA results. Marliss Coots, MD [facility] Radiology Electronically Signed   By: Marliss Coots M.D.   On: 10/27/2022 13:47       Lynden Oxford M.D. [facility] 10/27/2022, 8:21 PM  Available via Epic secure chat 7am-7pm After 7 pm, please refer to night coverage provider listed on amion.

## 2022-10-27 NOTE — Procedures (Signed)
Interventional Radiology Procedure Note  Procedure: T12 kyphoplasty  Findings: Please refer to procedural dictation for full description. Bipedicular access.   Complications: None immediate  Estimated Blood Loss: 5 mL  Recommendations: Strict 3 hour bedrest. IR will follow.   Marliss Coots, MD

## 2022-10-27 NOTE — Plan of Care (Signed)
  Problem: Fluid Volume: Goal: Hemodynamic stability will improve Outcome: Progressing   Problem: Clinical Measurements: Goal: Diagnostic test results will improve Outcome: Progressing Goal: Signs and symptoms of infection will decrease Outcome: Progressing   Problem: Respiratory: Goal: Ability to maintain adequate ventilation will improve Outcome: Progressing   Problem: Education: Goal: Knowledge of General Education information will improve Description: Including pain rating scale, medication(s)/side effects and non-pharmacologic comfort measures Outcome: Progressing   Problem: Health Behavior/Discharge Planning: Goal: Ability to manage health-related needs will improve Outcome: Progressing   Problem: Clinical Measurements: Goal: Ability to maintain clinical measurements within normal limits will improve Outcome: Progressing Goal: Will remain free from infection Outcome: Progressing Goal: Diagnostic test results will improve Outcome: Progressing Goal: Respiratory complications will improve Outcome: Progressing Goal: Cardiovascular complication will be avoided Outcome: Progressing   Problem: Activity: Goal: Risk for activity intolerance will decrease Outcome: Progressing   Problem: Nutrition: Goal: Adequate nutrition will be maintained Outcome: Progressing   Problem: Coping: Goal: Level of anxiety will decrease Outcome: Progressing   Problem: Elimination: Goal: Will not experience complications related to bowel motility Outcome: Progressing Goal: Will not experience complications related to urinary retention Outcome: Progressing   Problem: Pain Managment: Goal: General experience of comfort will improve Outcome: Progressing   Problem: Safety: Goal: Ability to remain free from injury will improve Outcome: Progressing   Problem: Skin Integrity: Goal: Risk for impaired skin integrity will decrease Outcome: Progressing   Problem: Education: Goal:  Understanding of post-operative needs will improve Outcome: Progressing Goal: Individualized Educational Video(s) Outcome: Progressing   Problem: Clinical Measurements: Goal: Postoperative complications will be avoided or minimized Outcome: Progressing   Problem: Respiratory: Goal: Will regain and/or maintain adequate ventilation Outcome: Progressing

## 2022-10-28 ENCOUNTER — Encounter (HOSPITAL_COMMUNITY): Payer: Self-pay | Admitting: Radiology

## 2022-10-28 DIAGNOSIS — K7682 Hepatic encephalopathy: Secondary | ICD-10-CM | POA: Diagnosis not present

## 2022-10-28 LAB — COMPREHENSIVE METABOLIC PANEL
ALT: 16 U/L (ref 0–44)
AST: 23 U/L (ref 15–41)
Albumin: 2.2 g/dL — ABNORMAL LOW (ref 3.5–5.0)
Alkaline Phosphatase: 92 U/L (ref 38–126)
Anion gap: 8 (ref 5–15)
BUN: 20 mg/dL (ref 8–23)
CO2: 18 mmol/L — ABNORMAL LOW (ref 22–32)
Calcium: 7.9 mg/dL — ABNORMAL LOW (ref 8.9–10.3)
Chloride: 109 mmol/L (ref 98–111)
Creatinine, Ser: 1.07 mg/dL — ABNORMAL HIGH (ref 0.44–1.00)
GFR, Estimated: 58 mL/min — ABNORMAL LOW (ref >60)
Glucose, Bld: 158 mg/dL — ABNORMAL HIGH (ref 70–99)
Potassium: 3.5 mmol/L (ref 3.5–5.1)
Sodium: 135 mmol/L (ref 135–145)
Total Bilirubin: 1.3 mg/dL — ABNORMAL HIGH (ref 0.3–1.2)
Total Protein: 5.7 g/dL — ABNORMAL LOW (ref 6.5–8.1)

## 2022-10-28 LAB — CBC WITH DIFFERENTIAL/PLATELET
Abs Immature Granulocytes: 0.03 10*3/uL (ref 0.00–0.07)
Basophils Absolute: 0 10*3/uL (ref 0.0–0.1)
Basophils Relative: 0 %
Eosinophils Absolute: 0 10*3/uL (ref 0.0–0.5)
Eosinophils Relative: 0 %
HCT: 26.9 % — ABNORMAL LOW (ref 36.0–46.0)
Hemoglobin: 8.2 g/dL — ABNORMAL LOW (ref 12.0–15.0)
Immature Granulocytes: 1 %
Lymphocytes Relative: 15 %
Lymphs Abs: 0.9 10*3/uL (ref 0.7–4.0)
MCH: 29.6 pg (ref 26.0–34.0)
MCHC: 30.5 g/dL (ref 30.0–36.0)
MCV: 97.1 fL (ref 80.0–100.0)
Monocytes Absolute: 0.5 10*3/uL (ref 0.1–1.0)
Monocytes Relative: 9 %
Neutro Abs: 4.5 10*3/uL (ref 1.7–7.7)
Neutrophils Relative %: 75 %
Platelets: 127 10*3/uL — ABNORMAL LOW (ref 150–400)
RBC: 2.77 MIL/uL — ABNORMAL LOW (ref 3.87–5.11)
RDW: 15 % (ref 11.5–15.5)
WBC: 6 10*3/uL (ref 4.0–10.5)
nRBC: 0 % (ref 0.0–0.2)

## 2022-10-28 LAB — MAGNESIUM: Magnesium: 1.4 mg/dL — ABNORMAL LOW (ref 1.7–2.4)

## 2022-10-28 LAB — PROTIME-INR
INR: 1.5 — ABNORMAL HIGH (ref 0.8–1.2)
Prothrombin Time: 18.6 s — ABNORMAL HIGH (ref 11.4–15.2)

## 2022-10-28 LAB — AMMONIA: Ammonia: 60 umol/L — ABNORMAL HIGH (ref 9–35)

## 2022-10-28 MED ORDER — CYCLOBENZAPRINE HCL 5 MG PO TABS
5.0000 mg | ORAL_TABLET | Freq: Three times a day (TID) | ORAL | Status: DC
  Administered 2022-10-28 – 2022-10-29 (×3): 5 mg via ORAL
  Filled 2022-10-28 (×3): qty 1

## 2022-10-28 MED ORDER — CEPHALEXIN 500 MG PO CAPS
500.0000 mg | ORAL_CAPSULE | Freq: Two times a day (BID) | ORAL | Status: DC
  Administered 2022-10-28 – 2022-10-30 (×5): 500 mg via ORAL
  Filled 2022-10-28 (×5): qty 1

## 2022-10-28 MED ORDER — LIDOCAINE 5 % EX PTCH
1.0000 | MEDICATED_PATCH | CUTANEOUS | Status: DC
  Administered 2022-10-28 – 2022-10-30 (×3): 1 via TRANSDERMAL
  Filled 2022-10-28 (×2): qty 1

## 2022-10-28 MED ORDER — MAGNESIUM SULFATE 4 GM/100ML IV SOLN
4.0000 g | Freq: Once | INTRAVENOUS | Status: AC
  Administered 2022-10-28: 4 g via INTRAVENOUS
  Filled 2022-10-28: qty 100

## 2022-10-28 MED ORDER — FUROSEMIDE 40 MG PO TABS
40.0000 mg | ORAL_TABLET | Freq: Every day | ORAL | Status: DC
  Administered 2022-10-28 – 2022-10-29 (×2): 40 mg via ORAL
  Filled 2022-10-28 (×2): qty 1

## 2022-10-28 NOTE — Progress Notes (Signed)
[facility] Progress Note Patient: Sabena Gismondi WJX:[id number] DOB: 24-Jun-1955 DOA: 10/17/2022  DOS: the patient was seen and examined on 10/28/2022  Brief hospital course: Patient is a 67 year old female with known alcoholic fatty liver disease, chronic diarrhea,(takes lactulose only if she does not move bm 3 to 4 times daily)  hypertension, obesity, PE, short gut syndrome, hypothyroidism, cholelithiasis, admitted with increasing confusion.  Patient lives at home with her son.  According to son she has been acting confused  Son noticed that she is more weak and confused than her baseline she had difficulty getting dressed and getting out of the house. Patient was recently admitted to the hospital 09/30/2022 discharged on 10/12/2022. Recent hospital admission she had sepsis secondary to Pseudomonas UTI and bacteremia.  Blood culture grew ESBL Pseudomonas treated with meropenem completed on 10/11/2022.  Follow-up blood cultures were negative.  She had vaginal bleeding she was started on Megace she was due to follow-up with gynecologist later this month.  Son tells me she was also is due to see infectious disease on 10/17/2022 but has not seen ended up in the hospital. Denies having sleep apnea.  Denies urinary complaints. She has some rash on the center of her chest which apparently is a cat scratch from her cat. Son reported she bites her tongue at times no history of seizures or incontinence reported Assessment and Plan: Acute metabolic encephalopathy secondary to hepatic encephalopathy (HCC) -MRI brain 10/7 showed no acute intracranial abnormality, moderately advanced chronic microvascular ischemic disease Ammonia on admission 169. Improving. Will recheck tomorrow. Mentation significantly better.  Pretty close to baseline. 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 Discussed with the patient, she reported improvement in her back pain after the prior kyphoplasty, IR evaluation requested Appreciate IR evaluation, blood cultures from 10/8 while Staph epidermidis, 1 set likely contaminant.   -Blood cultures 10/11 so far negative Underwent kyphoplasty on 10/17.  Monitor.   Iron deficiency anemia, ?  Hematemesis, concern for variceal bleeding Placed on octreotide, PPI, GI consulted.    EGD on 10/9 showed normal esophagus, friable gastric mucosa, portal hypertensive gastropathy, normal duodenum.  No active bleeding h/h stable   Chronic hypotension Continue midodrine   Hypothyroidism Continue Synthroid   Vaginal bleeding Continue Megace 40 mg twice daily, has appointment with GYN in 11/03/2022     Headache. Okay to take Tylenol. Continue as needed pain medication.   Obesity Class 3 Body mass index is 40.36 kg/m.  Placing the pt at higher risk of poor outcomes.  Paraspinal back pain. Reports pain on the left side paraspinally. Able to ambulate in the hallway though with walker. Suspect this is muscular in nature. Robaxin has not helped.  Will add Flexeril and monitor.  Right facial cellulitis. Will add Keflex and monitor.   Subjective: Able to ambulate in the hallway.  But then reports severe paraspinal pain.  No nausea no vomiting.  Reports bloating and gassy feeling.  Physical Exam: General: in moderate distress, No Rash, redness and warmth involving right facial area.  Extraocular muscle movement intact without any pain.  No conjunctival injection. Cardiovascular: S1 and S2 Present, No Murmur Respiratory: Good respiratory effort, Bilateral Air entry  present. No Crackles, No wheezes Abdomen: Bowel Sound present, No  tenderness Extremities: Trace edema Neuro: Alert and oriented x3, no new focal deficit  Data Reviewed: I have Reviewed nursing notes, Vitals, and Lab results. Since last encounter, pertinent lab results CBC and BMP in ammonia   . I have ordered test including CBC CMP magnesium  .   Disposition: Status is: Inpatient Remains inpatient appropriate because: Monitor for pain control, anticipating home in 24 to 48 hours.  SCDs Start: 10/17/22 1759   Family Communication: Son at bedside Level of care: Progressive   Vitals:   10/28/22 0224 10/28/22 0605 10/28/22 1300 10/28/22 1428  BP: 113/60 118/66 136/79 (!) 102/54  Pulse: 94 91 92 81  Resp: (!) 22 (!) 21 18 15   Temp: 98 F (36.7 C) 98.1 F (36.7 C) 98.1 F (36.7 C) 98.2 F (36.8 C)  TempSrc: Oral Oral Oral Oral  SpO2: 100% 99% 98% 100%  Weight:      Height:         Author: Lynden Oxford, MD 10/28/2022 6:41 PM  Please look on [email or web address] to find out who is on call.

## 2022-10-28 NOTE — Progress Notes (Signed)
Mobility Specialist - Progress Note   10/28/22 1200  Mobility  Activity Transferred to/from [facility];Ambulated with assistance in hallway  Level of Assistance Standby assist, set-up cues, supervision of patient - no hands on  Assistive Device Front wheel walker  Distance Ambulated (ft) 140 ft  Range of Motion/Exercises Active  Activity Response Tolerated fair  Mobility Referral Yes  $Mobility charge 1 Mobility  Mobility Specialist Start Time (ACUTE ONLY) 1130  Mobility Specialist Stop Time (ACUTE ONLY) 1200  Mobility Specialist Time Calculation (min) (ACUTE ONLY) 30 min   Pt was found on BSC and agreeable to ambulate. C/o back pain and knee pain. At EOS returned to use [facility] prior to returning to bed. Was left in bed with all needs met. Call bell in reach and son in room.  Billey Chang Mobility Specialist

## 2022-10-28 NOTE — Hospital Course (Addendum)
Brief hospital course: Patient is a 67 year old female with known alcoholic fatty liver disease, chronic diarrhea,(takes lactulose only if she does not move bm 3 to 4 times daily)  hypertension, obesity, PE, short gut syndrome, hypothyroidism, cholelithiasis, admitted with increasing confusion.  Patient lives at home with her son.  According to son she has been acting confused  Son noticed that she is more weak and confused than her baseline she had difficulty getting dressed and getting out of the house. Patient was recently admitted to the hospital 09/30/2022 discharged on 10/12/2022. Recent hospital admission she had sepsis secondary to Pseudomonas UTI and bacteremia.  Blood culture grew ESBL Pseudomonas treated with meropenem completed on 10/11/2022.  Follow-up blood cultures were negative.  She had vaginal bleeding she was started on Megace she was due to follow-up with gynecologist later this month.  Son tells me she was also is due to see infectious disease on 10/17/2022 but has not seen ended up in the hospital. Denies having sleep apnea.  Denies urinary complaints. She has some rash on the center of her chest which apparently is a cat scratch from her cat. Son reported she bites her tongue at times no history of seizures or incontinence reported.   Medically stable.  Awaiting son to return from out of town for discharge on 10/21.  Assessment and Plan: Acute metabolic encephalopathy secondary to hepatic encephalopathy (HCC) -MRI brain 10/7 showed no acute intracranial abnormality, moderately advanced chronic microvascular ischemic disease Ammonia on admission 169. Mentation was significantly better.  Worsened due to missing her lactulose and also due to pain medication.  Now improving again.  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   Acute on  chronic back pain Patient had kyphoplasty T3 and T5 in 08/2022 X-ray showed compression fracture in the lower thoracic spine, tentatively T12 has progressed on from prior exam. Discussed with the patient, she reported improvement in her back pain after the prior kyphoplasty, IR evaluation requested Appreciate IR evaluation, blood cultures from 10/8 while Staph epidermidis, 1 set likely contaminant.   -Blood cultures 10/11 so far negative Underwent kyphoplasty on 10/17.   Continue current pain medications.   Iron deficiency anemia, ?  Hematemesis, concern for variceal bleeding Placed on octreotide, PPI, GI consulted.    EGD on 10/9 showed normal esophagus, friable gastric mucosa, portal hypertensive gastropathy, normal duodenum.  No active bleeding h/h stable   Chronic hypotension Continue midodrine   Hypothyroidism Continue Synthroid   Vaginal bleeding Continue Megace 40 mg twice daily, has appointment with GYN in 11/03/2022     Headache. Okay to take Tylenol. Continue as needed pain medication.   Obesity Class 3 Body mass index is 40.36 kg/m.  Placing the pt at higher risk of poor outcomes.  Paraspinal back pain. Reports pain on the left side paraspinally. Able to ambulate in the hallway though with walker. Suspect this is muscular in nature. Lidocaine patch.  Right facial cellulitis. Will add Keflex and monitor.

## 2022-10-28 NOTE — Plan of Care (Signed)
[facility] Tonsillectomy/Adenoidectomy, Postoperative PEDS care plan entered in error.

## 2022-10-28 NOTE — Progress Notes (Signed)
Referring Physician(s): [name],P  Supervising Physician: [name]  Patient Status:  [facility] - In-pt  Chief Complaint: Neck/back pain  Subjective: Patient continues to have some mid back pain and neck pain.  States that since kyphoplasty she is able to ambulate with less back pain however.  Reports no new neurological changes.   Allergies: Ace inhibitors, Ivp dye [iodinated contrast media], Desitin [zinc oxide], Hydroxyzine, Chlorhexidine, Doxycycline, Penicillins, and Zofran [ondansetron]  Medications: Prior to Admission medications   Medication Sig Start Date End Date Taking? Authorizing Provider  atorvastatin (LIPITOR) 40 MG tablet Take 40 mg by mouth in the morning. 01/19/22  Yes [provider]  ciclopirox (PENLAC) 8 % solution Apply 1 application  topically See admin instructions. Apply over all toenails and surrounding skin at bedtime. Apply daily over previous coat. After seven (7) days, may remove with alcohol and continue cycle.   Yes [provider]  colchicine 0.6 MG tablet Take 0.6 mg by mouth daily as needed (gout flares).   Yes [provider]  cyanocobalamin (VITAMIN B12) 1000 MCG tablet Take 2,000 mcg by mouth every Friday.   Yes [provider]  dicyclomine (BENTYL) 10 MG capsule Take 1 capsule (10 mg total) by mouth 3 (three) times daily as needed for spasms (abdominal cramping). 09/09/22  Yes Lewie Chamber, MD  EPINEPHrine 0.3 mg/0.3 mL IJ SOAJ injection Inject 0.3 mg into the muscle as needed for anaphylaxis. 08/07/19  Yes [provider]  folic acid (FOLVITE) 1 MG tablet Take 1 tablet (1 mg total) by mouth daily. 06/16/22  Yes Calvert Cantor, MD  furosemide (LASIX) 40 MG tablet Take 1 tablet (40 mg total) by mouth 2 (two) times daily. 09/09/22  Yes Lewie Chamber, MD  HYDROcodone-acetaminophen (NORCO) 10-325 MG tablet Take 1 tablet by mouth 3 (three) times daily as needed for moderate pain or severe pain. 02/25/22  Yes  [provider]  IRON-VITAMIN C PO Take 1 tablet by mouth daily with breakfast.   Yes [provider]  lactulose (CHRONULAC) 10 GM/15ML solution Take 30 mLs (20 g total) by mouth 3 (three) times daily. Patient taking differently: Take 20 g by mouth 3 (three) times daily as needed for mild constipation or moderate constipation. 09/09/22  Yes Lewie Chamber, MD  levothyroxine (SYNTHROID) 150 MCG tablet Take 150 mcg by mouth daily before breakfast. 10/26/21  Yes [provider]  magnesium gluconate (MAGONATE) 500 MG tablet Take 1,000 mg by mouth daily.   Yes [provider]  megestrol (MEGACE) 40 MG tablet Take 1 tablet (40 mg total) by mouth 2 (two) times daily. 10/11/22 11/10/22 Yes Carollee Herter, DO  methocarbamol (ROBAXIN) 500 MG tablet Take 500 mg by mouth in the morning. 03/04/22  Yes [provider]  midodrine (PROAMATINE) 10 MG tablet Take 1 tablet (10 mg total) by mouth 3 (three) times daily with meals. 09/09/22  Yes Lewie Chamber, MD  naloxone [facility]) nasal spray 4 mg/0.1 mL Place 1 spray into the nose as needed (accidental overdose). 03/25/22  Yes [provider]  nystatin cream (MYCOSTATIN) Apply topically 2 (two) times daily. Patient taking differently: Apply 1 Application topically 2 (two) times daily as needed for dry skin (or irritation). 05/22/22  Yes Leatha Gilding, MD  nystatin powder Apply 1 Application topically 3 (three) times daily as needed (for irritation- affected areas).   Yes [provider]  pantoprazole (PROTONIX) 40 MG tablet Take 1 tablet (40 mg total) by mouth 2 (two) times daily  before a meal. 09/09/22  Yes Lewie Chamber, MD  potassium chloride (KLOR-CON) 10 MEQ tablet Take 20 mEq by mouth in the morning and at bedtime.   Yes [provider]  promethazine (PHENERGAN) 12.5 MG tablet Take 12.5 mg by mouth every 6 (six) hours as needed for nausea or vomiting.   Yes [provider]  rifaximin  (XIFAXAN) 550 MG TABS tablet Take 550 mg by mouth 2 (two) times daily.   Yes [provider]  rOPINIRole (REQUIP) 0.25 MG tablet Take 1 tablet (0.25 mg total) by mouth at bedtime. 09/09/22  Yes Lewie Chamber, MD  spironolactone (ALDACTONE) 25 MG tablet Take 1 tablet (25 mg total) by mouth daily. 09/10/22  Yes Lewie Chamber, MD  tamsulosin (FLOMAX) 0.4 MG CAPS capsule Take 1 capsule (0.4 mg total) by mouth daily. 09/10/22  Yes Lewie Chamber, MD  triamcinolone cream (KENALOG) 0.5 % Apply to affected area of right breast twice daily as directed. Patient taking differently: Apply 1 Application topically 2 (two) times daily as needed (for irritation- right breast). 05/10/22  Yes Osvaldo Shipper, MD  TYLENOL 500 MG tablet Take 500 mg by mouth every 6 (six) hours as needed for mild pain or headache.   Yes [provider]  venlafaxine XR (EFFEXOR-XR) 75 MG 24 hr capsule Take 75 mg by mouth daily with breakfast. 08/17/21  Yes [provider]  Vitamin D, Ergocalciferol, (DRISDOL) 50000 UNITS CAPS Take 50,000 Units by mouth every Wednesday.   Yes [provider]  hydrOXYzine (ATARAX/VISTARIL) 25 MG tablet Take 12.5 mg by mouth 3 (three) times daily as needed for itching. Patient not taking: Reported on 10/17/2022    [provider]     Vital Signs: BP (!) 102/54 (BP Location: Left Arm)   Pulse 81   Temp 98.2 F (36.8 C) (Oral)   Resp 15   Ht 5\' 2"  (1.575 m)   Wt 220 lb 10.9 oz (100.1 kg)   SpO2 100%   BMI 40.36 kg/m   Physical Exam patient awake, answering questions okay.  Puncture sites T12 region clean, dry, mildly tender, no hematoma.  Patient moving all fours okay.  Imaging: IR KYPHO THORACIC WITH BONE BIOPSY  Result Date: 10/27/2022 CLINICAL DATA:  67 year old female with acute osteoporotic vertebral compression fracture of T12 persistent back pain. EXAM: FLUOROSCOPIC GUIDED KYPHOPLASTY OF THE T12 VERTEBRAL BODY COMPARISON:  None Available.  MEDICATIONS: The patient was receiving intravenous antibiotics as an inpatient, no additional antibiotics were administered. ANESTHESIA/SEDATION: Moderate (conscious) sedation was employed during this procedure. A total of Versed 2 mg and Fentanyl 100 mcg was administered intravenously. Moderate Sedation Time: 27 minutes. The patient's level of consciousness and vital signs were monitored continuously by radiology nursing throughout the procedure under my direct supervision. FLUOROSCOPY TIME:  Four hundred forty-two mGy COMPLICATIONS: None immediate. PROCEDURE: The procedure, risks (including but not limited to bleeding, infection, organ damage), benefits, and alternatives were explained to the patient. Questions regarding the procedure were encouraged and answered. The patient understands and consents to the procedure. The patient has suffered a fracture of the T12 vertebral body. It is recommended that patients aged 46 years or older be evaluated for possible testing or treatment of osteoporosis. A copy of this procedure report is sent to the patient's referring physician The patient was placed prone on the fluoroscopic table. The skin overlying the lower thoracic region was then prepped and draped in the usual sterile fashion. Maximal barrier sterile technique was utilized including caps, mask,  sterile gowns, sterile gloves, sterile drape, hand hygiene and skin antiseptic. Intravenous Fentanyl and Versed were administered as conscious sedation during continuous cardiorespiratory monitoring by the radiology RN. The left pedicle at T12 was then infiltrated with 1% lidocaine followed by the advancement of a Kyphon trocar needle through the left pedicle into the posterior one-third of the vertebral body. Subsequently, the osteo drill was advanced to the anterior third of the vertebral body. The osteo drill was retracted. Through the working cannula, a Kyphon inflatable bone tamp 15 x 3 was advanced and positioned  with the distal marker approximately 5 mm from the anterior aspect of the cortex. Appropriate positioning was confirmed on the AP projection. At this time, the balloon was expanded using contrast via a Kyphon inflation syringe device via micro tubing. In similar fashion, the right T12 pedicle was infiltrated with 1% lidocaine followed by the advancement of a second Kyphon trocar needle through the right pedicle into the posterior third of the vertebral body. Subsequently, the osteo drill was coaxially advanced to the anterior right third. The osteo drill was exchanged for a Kyphon inflatable bone tamp 15 x 3, advanced to the 5 mm of the anterior aspect of the cortex. The balloon was then expanded using contrast as above. Inflations were continued until there was near apposition with the superior end plate. At this time, methylmethacrylate mixture was reconstituted in the Kyphon bone mixing device system. This was then loaded into the delivery mechanism, attached to Kyphon bone fillers. The balloons were deflated and removed followed by the instillation of methylmethacrylate mixture with excellent filling in the AP and lateral projections. No extravasation was noted in the disk spaces or posteriorly into the spinal canal. No epidural venous contamination was seen. The working cannulae and the bone filler were then retrieved and removed. Hemostasis was achieved with manual compression. The patient tolerated the procedure well without immediate postprocedural complication. IMPRESSION: 1. Technically successful T12 vertebral body augmentation using balloon kyphoplasty. 2. Per CMS PQRS reporting requirements (PQRS Measure 24): Given the patient's age of greater than 50 and the fracture site (hip, distal radius, or spine), the patient should be tested for osteoporosis using DXA, and the appropriate treatment considered based on the DXA results. Kara Coots, MD [facility]  Radiology Electronically Signed   By: [name] M.D.   On: 10/27/2022 13:47    Labs:  CBC: Recent Labs    10/23/22 0422 10/24/22 0356 10/26/22 0347 10/28/22 0344  WBC 5.4 5.1 4.2 6.0  HGB 8.0* 8.3* 8.3* 8.2*  HCT 26.3* 27.2* 27.0* 26.9*  PLT 135* 119* 103* 127*    COAGS: Recent Labs    06/14/22 0505 08/11/22 0730 08/12/22 0325 08/12/22 1512 08/13/22 0335 08/14/22 1316 08/15/22 0552 10/22/22 0400 10/26/22 1145 10/27/22 0353 10/28/22 0344  INR 3.2* 3.4*   < > 3.4*   < > 2.1*   < > 1.8* 1.7* 1.6* 1.5*  APTT 53* 49*  --  53*  --  45*  --   --   --   --   --    < > = values in this interval not displayed.    BMP: Recent Labs    10/23/22 0422 10/24/22 0356 10/26/22 0347 10/28/22 0344  NA 140 140 137 135  K 3.6 3.6 3.8 3.5  CL 115* 116* 109 109  CO2 19* 19* 21* 18*  GLUCOSE 100* 113* 104* 158*  BUN 15 14 13 20   CALCIUM 8.0* 8.0* 8.2*  7.9*  CREATININE 0.98 0.94 0.78 1.07*  GFRNONAA >60 >60 >60 58*    LIVER FUNCTION TESTS: Recent Labs    10/09/22 0709 10/17/22 1205 10/18/22 0525 10/28/22 0344  BILITOT 1.4* 1.9* 2.2* 1.3*  AST 62* 35 44* 23  ALT 40 28 26 16   ALKPHOS 101 108 103 92  PROT 5.1* 5.9* 6.0* 5.7*  ALBUMIN 1.9* 2.4* 2.4* 2.2*    Assessment and Plan: 67 y.o. 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  Recent imaging  revealed acute compression fractures of T3 and T5 with marrow edema with moderate height loss at T3 and mild to moderate height loss at T5 ; also with new T12 fracture; status post T12  kyphoplasty yesterday; afebrile, WBC normal, hemoglobin stable, creatinine 1.07; recommend PT/OOB as tolerated; alternating warm /cool compresses to back, attempt to titrate back pain meds   Electronically Signed: D. Jeananne Rama, PA-C 10/28/2022, 3:37 PM   I spent a total of 15 Minutes at the the patient's bedside AND on the patient's hospital floor or unit, greater than 50% of which was counseling/coordinating care for T12 kyphoplasty    Patient ID: [name], female   DOB: 05/21/1955, 67 y.o.   MRN: [id number]

## 2022-10-29 DIAGNOSIS — K7682 Hepatic encephalopathy: Secondary | ICD-10-CM | POA: Diagnosis not present

## 2022-10-29 LAB — CBC WITH DIFFERENTIAL/PLATELET
Abs Immature Granulocytes: 0.02 10*3/uL (ref 0.00–0.07)
Basophils Absolute: 0.1 10*3/uL (ref 0.0–0.1)
Basophils Relative: 1 %
Eosinophils Absolute: 0.2 10*3/uL (ref 0.0–0.5)
Eosinophils Relative: 2 %
HCT: 27.8 % — ABNORMAL LOW (ref 36.0–46.0)
Hemoglobin: 8.1 g/dL — ABNORMAL LOW (ref 12.0–15.0)
Immature Granulocytes: 0 %
Lymphocytes Relative: 35 %
Lymphs Abs: 2.3 10*3/uL (ref 0.7–4.0)
MCH: 29.2 pg (ref 26.0–34.0)
MCHC: 29.1 g/dL — ABNORMAL LOW (ref 30.0–36.0)
MCV: 100.4 fL — ABNORMAL HIGH (ref 80.0–100.0)
Monocytes Absolute: 0.8 10*3/uL (ref 0.1–1.0)
Monocytes Relative: 12 %
Neutro Abs: 3.1 10*3/uL (ref 1.7–7.7)
Neutrophils Relative %: 50 %
Platelets: 135 10*3/uL — ABNORMAL LOW (ref 150–400)
RBC: 2.77 MIL/uL — ABNORMAL LOW (ref 3.87–5.11)
RDW: 15.2 % (ref 11.5–15.5)
WBC: 6.5 10*3/uL (ref 4.0–10.5)
nRBC: 0 % (ref 0.0–0.2)

## 2022-10-29 LAB — COMPREHENSIVE METABOLIC PANEL
ALT: 16 U/L (ref 0–44)
AST: 23 U/L (ref 15–41)
Albumin: 2.4 g/dL — ABNORMAL LOW (ref 3.5–5.0)
Alkaline Phosphatase: 101 U/L (ref 38–126)
Anion gap: 6 (ref 5–15)
BUN: 22 mg/dL (ref 8–23)
CO2: 19 mmol/L — ABNORMAL LOW (ref 22–32)
Calcium: 8 mg/dL — ABNORMAL LOW (ref 8.9–10.3)
Chloride: 112 mmol/L — ABNORMAL HIGH (ref 98–111)
Creatinine, Ser: 1.17 mg/dL — ABNORMAL HIGH (ref 0.44–1.00)
GFR, Estimated: 52 mL/min — ABNORMAL LOW (ref >60)
Glucose, Bld: 112 mg/dL — ABNORMAL HIGH (ref 70–99)
Potassium: 3.2 mmol/L — ABNORMAL LOW (ref 3.5–5.1)
Sodium: 137 mmol/L (ref 135–145)
Total Bilirubin: 1.2 mg/dL (ref 0.3–1.2)
Total Protein: 5.7 g/dL — ABNORMAL LOW (ref 6.5–8.1)

## 2022-10-29 LAB — MAGNESIUM: Magnesium: 1.9 mg/dL (ref 1.7–2.4)

## 2022-10-29 MED ORDER — HYDROCODONE-ACETAMINOPHEN 5-325 MG PO TABS
1.0000 | ORAL_TABLET | Freq: Four times a day (QID) | ORAL | Status: DC | PRN
  Administered 2022-10-30 (×2): 1 via ORAL
  Filled 2022-10-29 (×3): qty 1

## 2022-10-29 MED ORDER — OXYCODONE HCL 5 MG PO TABS: 5.0000 mg | ORAL_TABLET | Freq: Three times a day (TID) | ORAL | Status: DC | PRN

## 2022-10-29 MED ORDER — ACETAMINOPHEN 500 MG PO TABS: 500.0000 mg | ORAL_TABLET | Freq: Three times a day (TID) | ORAL | Status: DC

## 2022-10-29 MED ORDER — LACTULOSE 10 GM/15ML PO SOLN
20.0000 g | Freq: Three times a day (TID) | ORAL | Status: DC
  Administered 2022-10-29 – 2022-10-30 (×4): 20 g via ORAL
  Filled 2022-10-29 (×3): qty 30

## 2022-10-29 MED ORDER — ACETAMINOPHEN 500 MG PO TABS
500.0000 mg | ORAL_TABLET | Freq: Two times a day (BID) | ORAL | Status: DC
  Administered 2022-10-29 – 2022-10-30 (×3): 500 mg via ORAL
  Filled 2022-10-29 (×3): qty 1

## 2022-10-29 MED ORDER — LACTULOSE 10 GM/15ML PO SOLN
30.0000 g | Freq: Once | ORAL | Status: AC
  Administered 2022-10-29: 30 g via ORAL

## 2022-10-29 NOTE — Plan of Care (Signed)
  Problem: Skin Integrity: Goal: Risk for impaired skin integrity will decrease Outcome: Progressing   Problem: Safety: Goal: Ability to remain free from injury will improve Outcome: Progressing   Problem: Pain Managment: Goal: General experience of comfort will improve Outcome: Progressing   Problem: Coping: Goal: Level of anxiety will decrease Outcome: Progressing   Problem: Nutrition: Goal: Adequate nutrition will be maintained Outcome: Progressing   Problem: Clinical Measurements: Goal: Ability to maintain clinical measurements within normal limits will improve Outcome: Progressing

## 2022-10-29 NOTE — Progress Notes (Signed)
[facility] Progress Note Patient: [name] ZOX:[id number] DOB: 1955-03-14 DOA: 10/17/2022  DOS: the patient was seen and examined on 10/29/2022  Brief hospital course: Patient is a 67 year old female with known alcoholic fatty liver disease, chronic diarrhea,(takes lactulose only if she does not move bm 3 to 4 times daily)  hypertension, obesity, PE, short gut syndrome, hypothyroidism, cholelithiasis, admitted with increasing confusion.  Patient lives at home with her son.  According to son she has been acting confused  Son noticed that she is more weak and confused than her baseline she had difficulty getting dressed and getting out of the house. Patient was recently admitted to the hospital 09/30/2022 discharged on 10/12/2022. Recent hospital admission she had sepsis secondary to Pseudomonas UTI and bacteremia.  Blood culture grew ESBL Pseudomonas treated with meropenem completed on 10/11/2022.  Follow-up blood cultures were negative.  She had vaginal bleeding she was started on Megace she was due to follow-up with gynecologist later this month.  Son tells me she was also is due to see infectious disease on 10/17/2022 but has not seen ended up in the hospital. Denies having sleep apnea.  Denies urinary complaints. She has some rash on the center of her chest which apparently is a cat scratch from her cat. Son reported she bites her tongue at times no history of seizures or incontinence reported Assessment and Plan: Acute metabolic encephalopathy secondary to hepatic encephalopathy (HCC) -MRI brain 10/7 showed no acute intracranial abnormality, moderately advanced chronic microvascular ischemic disease Ammonia on admission 169. Mentation was significantly better. Now due to ongoing pain medication use as well as minimal use of lactulose mentation worsening again. Some obstruction seen on 10/19. Will provide extra dose of rectal lactulose and make pain medication changes and  monitor. 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   Acute on chronic back pain Patient had kyphoplasty T3 and T5 in 08/2022 X-ray showed compression fracture in the lower thoracic spine, tentatively T12 has progressed on from prior exam. Discussed with the patient, she reported improvement in her back pain after the prior kyphoplasty, IR evaluation requested Appreciate IR evaluation, blood cultures from 10/8 while Staph epidermidis, 1 set likely contaminant.   -Blood cultures 10/11 so far negative Underwent kyphoplasty on 10/17.   Patient was on oxycodone as well as Flexeril. Currently due to encephalopathy will be changing medications and monitor.   Iron deficiency anemia, ?  Hematemesis, concern for variceal bleeding Placed on octreotide, PPI, GI consulted.    EGD on 10/9 showed normal esophagus, friable gastric mucosa, portal hypertensive gastropathy, normal duodenum.  No active bleeding h/h stable   Chronic hypotension Continue midodrine   Hypothyroidism Continue Synthroid   Vaginal bleeding Continue Megace 40 mg twice daily, has appointment with GYN in 11/03/2022     Headache. Okay to take Tylenol. Continue as needed pain medication.   Obesity Class 3 Body mass index is 40.36 kg/m.  Placing the pt at higher risk of poor outcomes.  Paraspinal back pain. Reports pain on the left side paraspinally. Able to ambulate in the hallway though with walker. Suspect this is muscular in nature. Robaxin has not helped.  Will add Flexeril and monitor.  Right facial cellulitis. Will add Keflex and monitor.   Subjective: Somewhat confused.  Able to follow commands.  No nausea no vomiting.  Oral  intake less than adequate.  Physical Exam: General: in Mild distress, No Rash, right face  erythema improving. Cardiovascular: S1 and S2 Present, No Murmur Respiratory: Good respiratory effort, Bilateral Air entry present. No Crackles, No wheezes Abdomen: Bowel Sound present, diffuse tenderness Extremities: Trace edema Neuro: Alert and oriented x3, no new focal deficit, asterixis present.  Data Reviewed: I have Reviewed nursing notes, Vitals, and Lab results. Since last encounter, pertinent lab results CBC and BMP and serum magnesium   . I have ordered test including CBC and BMP and magnesium  .   Disposition: Status is: Inpatient Remains inpatient appropriate because: Need for improvement in mentation  SCDs Start: 10/17/22 1759   Family Communication: Family at bedside Level of care: Progressive   Vitals:   10/28/22 1428 10/28/22 2058 10/29/22 0502 10/29/22 1251  BP: (!) 102/54 (!) 110/57 (!) 150/78 121/64  Pulse: 81 79 87 91  Resp: 15 19 20 20   Temp: 98.2 F (36.8 C) 98.3 F (36.8 C) 98.3 F (36.8 C) 98.4 F (36.9 C)  TempSrc: Oral Oral Oral Oral  SpO2: 100% 97% 97% 100%  Weight:      Height:         Author: Lynden Oxford, MD 10/29/2022 6:56 PM  Please look on [email or web address] to find out who is on call.

## 2022-10-30 DIAGNOSIS — K7682 Hepatic encephalopathy: Secondary | ICD-10-CM | POA: Diagnosis not present

## 2022-10-30 LAB — COMPREHENSIVE METABOLIC PANEL
ALT: 15 U/L (ref 0–44)
AST: 24 U/L (ref 15–41)
Albumin: 2.3 g/dL — ABNORMAL LOW (ref 3.5–5.0)
Alkaline Phosphatase: 94 U/L (ref 38–126)
Anion gap: 7 (ref 5–15)
BUN: 21 mg/dL (ref 8–23)
CO2: 20 mmol/L — ABNORMAL LOW (ref 22–32)
Calcium: 8.1 mg/dL — ABNORMAL LOW (ref 8.9–10.3)
Chloride: 112 mmol/L — ABNORMAL HIGH (ref 98–111)
Creatinine, Ser: 1.12 mg/dL — ABNORMAL HIGH (ref 0.44–1.00)
GFR, Estimated: 55 mL/min — ABNORMAL LOW (ref >60)
Glucose, Bld: 93 mg/dL (ref 70–99)
Potassium: 3.3 mmol/L — ABNORMAL LOW (ref 3.5–5.1)
Sodium: 139 mmol/L (ref 135–145)
Total Bilirubin: 1.4 mg/dL — ABNORMAL HIGH (ref 0.3–1.2)
Total Protein: 5.3 g/dL — ABNORMAL LOW (ref 6.5–8.1)

## 2022-10-30 LAB — CBC WITH DIFFERENTIAL/PLATELET
Abs Immature Granulocytes: 0 10*3/uL (ref 0.00–0.07)
Basophils Absolute: 0.1 10*3/uL (ref 0.0–0.1)
Basophils Relative: 2 %
Eosinophils Absolute: 0 10*3/uL (ref 0.0–0.5)
Eosinophils Relative: 0 %
HCT: 28 % — ABNORMAL LOW (ref 36.0–46.0)
Hemoglobin: 8.3 g/dL — ABNORMAL LOW (ref 12.0–15.0)
Lymphocytes Relative: 23 %
Lymphs Abs: 1.3 10*3/uL (ref 0.7–4.0)
MCH: 29.1 pg (ref 26.0–34.0)
MCHC: 29.6 g/dL — ABNORMAL LOW (ref 30.0–36.0)
MCV: 98.2 fL (ref 80.0–100.0)
Monocytes Absolute: 0.2 10*3/uL (ref 0.1–1.0)
Monocytes Relative: 4 %
Neutro Abs: 4.1 10*3/uL (ref 1.7–7.7)
Neutrophils Relative %: 71 %
Platelets: 131 10*3/uL — ABNORMAL LOW (ref 150–400)
RBC: 2.85 MIL/uL — ABNORMAL LOW (ref 3.87–5.11)
RDW: 15.3 % (ref 11.5–15.5)
WBC: 5.8 10*3/uL (ref 4.0–10.5)
nRBC: 0 % (ref 0.0–0.2)

## 2022-10-30 LAB — MAGNESIUM: Magnesium: 1.7 mg/dL (ref 1.7–2.4)

## 2022-10-30 MED ORDER — POTASSIUM CHLORIDE CRYS ER 20 MEQ PO TBCR
40.0000 meq | EXTENDED_RELEASE_TABLET | ORAL | Status: AC
  Administered 2022-10-30 (×2): 40 meq via ORAL
  Filled 2022-10-30 (×2): qty 2

## 2022-10-30 NOTE — Plan of Care (Signed)

## 2022-10-30 NOTE — Progress Notes (Signed)
[facility] Progress Note Patient: [name] UJW:[id number] DOB: Jan 13, 1955 DOA: 10/17/2022  DOS: the patient was seen and examined on 10/30/2022  Brief hospital course: Patient is a 67 year old female with known alcoholic fatty liver disease, chronic diarrhea,(takes lactulose only if she does not move bm 3 to 4 times daily)  hypertension, obesity, PE, short gut syndrome, hypothyroidism, cholelithiasis, admitted with increasing confusion.  Patient lives at home with her son.  According to son she has been acting confused  Son noticed that she is more weak and confused than her baseline she had difficulty getting dressed and getting out of the house. Patient was recently admitted to the hospital 09/30/2022 discharged on 10/12/2022. Recent hospital admission she had sepsis secondary to Pseudomonas UTI and bacteremia.  Blood culture grew ESBL Pseudomonas treated with meropenem completed on 10/11/2022.  Follow-up blood cultures were negative.  She had vaginal bleeding she was started on Megace she was due to follow-up with gynecologist later this month.  Son tells me she was also is due to see infectious disease on 10/17/2022 but has not seen ended up in the hospital. Denies having sleep apnea.  Denies urinary complaints. She has some rash on the center of her chest which apparently is a cat scratch from her cat. Son reported she bites her tongue at times no history of seizures or incontinence reported.   Medically stable.  Awaiting son to return from out of town for discharge on 10/21.  Assessment and Plan: Acute metabolic encephalopathy secondary to hepatic encephalopathy (HCC) -MRI brain 10/7 showed no acute intracranial abnormality, moderately advanced chronic microvascular ischemic disease Ammonia on admission 169. Mentation was significantly better.  Worsened due to missing her lactulose and also due to pain medication.  Now improving again.  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   Acute on chronic back pain Patient had kyphoplasty T3 and T5 in 08/2022 X-ray showed compression fracture in the lower thoracic spine, tentatively T12 has progressed on from prior exam. Discussed with the patient, she reported improvement in her back pain after the prior kyphoplasty, IR evaluation requested Appreciate IR evaluation, blood cultures from 10/8 while Staph epidermidis, 1 set likely contaminant.   -Blood cultures 10/11 so far negative Underwent kyphoplasty on 10/17.   Continue current pain medications.   Iron deficiency anemia, ?  Hematemesis, concern for variceal bleeding Placed on octreotide, PPI, GI consulted.    EGD on 10/9 showed normal esophagus, friable gastric mucosa, portal hypertensive gastropathy, normal duodenum.  No active bleeding h/h stable   Chronic hypotension Continue midodrine   Hypothyroidism Continue Synthroid   Vaginal bleeding Continue Megace 40 mg twice daily, has appointment with GYN in 11/03/2022     Headache. Okay to take Tylenol. Continue as needed pain medication.   Obesity Class 3 Body mass index is 40.36 kg/m.  Placing the pt at higher risk of poor outcomes.  Paraspinal back pain. Reports pain on the left side paraspinally. Able to ambulate in the hallway though with walker. Suspect this is muscular in nature. Lidocaine patch.  Right facial cellulitis. Will add Keflex and monitor.   Subjective: Mentation better.  No nausea or vomiting.  Rash of the face are resolved.  Physical Exam: Clear to auscultation. S1-S2 present No asterixis.  Data Reviewed: I have Reviewed nursing notes, Vitals, and Lab results. Reviewed CBC and CMP.  Disposition: Status is: Inpatient Remains  inpatient appropriate because: Medically stable.  Awaiting family availability.  SCDs Start: 10/17/22 1759    Family Communication: No one at bedside, discussed with son on the phone. Level of care: Progressive   Vitals:   10/29/22 1251 10/29/22 2132 10/30/22 0538 10/30/22 1353  BP: 121/64 124/65 (!) 122/57 116/63  Pulse: 91 82 81 83  Resp: 20 16 16 20   Temp: 98.4 F (36.9 C) 98.2 F (36.8 C) 99.9 F (37.7 C) 98.2 F (36.8 C)  TempSrc: Oral Oral Oral Oral  SpO2: 100% 100% 98% 96%  Weight:      Height:         Author: Lynden Oxford, MD 10/30/2022 6:11 PM  Please look on [email or web address] to find out who is on call.

## 2022-10-31 ENCOUNTER — Other Ambulatory Visit (HOSPITAL_COMMUNITY): Payer: Self-pay

## 2022-10-31 ENCOUNTER — Telehealth (HOSPITAL_COMMUNITY): Payer: Self-pay | Admitting: Pharmacy Technician

## 2022-10-31 DIAGNOSIS — K7682 Hepatic encephalopathy: Secondary | ICD-10-CM | POA: Diagnosis not present

## 2022-10-31 MED ORDER — LACTULOSE 10 GM/15ML PO SOLN: 20.0000 g | Freq: Three times a day (TID) | ORAL | 2 refills | Status: DC

## 2022-10-31 MED ORDER — CIPROFLOXACIN-DEXAMETHASONE 0.3-0.1 % OT SUSP: 4.0000 [drp] | Freq: Two times a day (BID) | OTIC | 0 refills | Status: AC

## 2022-10-31 MED ORDER — LIDOCAINE 5 % EX PTCH: 1.0000 | MEDICATED_PATCH | CUTANEOUS | 0 refills | Status: DC

## 2022-10-31 MED ORDER — CEPHALEXIN 500 MG PO CAPS: 500.0000 mg | ORAL_CAPSULE | Freq: Two times a day (BID) | ORAL | 0 refills | Status: AC

## 2022-10-31 MED ORDER — FUROSEMIDE 40 MG PO TABS: 40.0000 mg | ORAL_TABLET | Freq: Every day | ORAL | Status: DC

## 2022-10-31 NOTE — Telephone Encounter (Signed)
Pharmacy Patient Advocate Encounter  Received notification from [employer] that Prior Authorization for Lidocaine 5% patches  has been APPROVED from 10/31/2022 to 10/31/2023. Ran test claim, Copay is $0.00. This test claim was processed through [facility]- copay amounts may vary at other pharmacies due to pharmacy/plan contracts, or as the patient moves through the different stages of their insurance plan.   PA #/Case ID/Reference #: [id number]

## 2022-10-31 NOTE — TOC Transition Note (Signed)
Transition of Care [facility]) - CM/SW Discharge Note   Patient Details  Name: [name] MRN: [id number] Date of Birth: 1955/04/17  [facility] of [facility]) CM/SW Contact:  Larrie Kass, LCSW Phone Number: 10/31/2022, 10:14 AM   Clinical Narrative:    CSW spoke with pt and pt's son , pt' son will provided transportation. Pt's HH PT/OT/RN arranged with [facility]. No further TOC needs TOC sign off.    Final next level of care: Home w Hospice Care Barriers to Discharge: No Barriers Identified   Patient Goals and CMS Choice CMS Medicare.gov Compare Post Acute Care list provided to:: Patient Choice offered to / list presented to : Patient  Discharge Placement                    Name of family member notified: Helga, [place] (Son)  [phone] [facility]) Patient and family notified of of transfer: 10/31/22  Discharge Plan and Services Additional resources added to the After Visit Summary for                            [facility] Arranged: RN, PT, OT [facility] Agency: [facility] ([facility]) Date HH Agency Contacted: 10/31/22 Time HH Agency Contacted: [phone] Representative spoke with at [facility] Agency: kelly  Social Determinants of Health (SDOH) Interventions SDOH Screenings   Food Insecurity: No Food Insecurity (10/18/2022)  Housing: Low Risk  (10/18/2022)  Transportation Needs: No Transportation Needs (10/18/2022)  Utilities: Not At Risk (10/18/2022)  Tobacco Use: Medium Risk (10/19/2022)     Readmission Risk Interventions    10/19/2022   10:12 AM 08/15/2022    3:20 PM 05/18/2022   10:24 AM  Readmission Risk Prevention Plan  Transportation Screening Complete Complete Complete  PCP or Specialist Appt within 5-7 Days   Complete  Home Care Screening   Complete  Medication Review (RN CM)   Complete  Medication Review (RN Care Manager) Complete Complete   PCP or Specialist appointment within 3-5 days of discharge Complete Complete   HRI or Home Care Consult Complete  Complete   SW Recovery Care/Counseling Consult Complete Complete   Palliative Care Screening Not Applicable Not Applicable   Skilled Nursing Facility Not Applicable Not Applicable

## 2022-10-31 NOTE — Progress Notes (Signed)
Mobility Specialist - Progress Note   10/31/22 0912  Mobility  Activity Ambulated with assistance in hallway  Level of Assistance Standby assist, set-up cues, supervision of patient - no hands on  Assistive Device Front wheel walker  Distance Ambulated (ft) 190 ft  Range of Motion/Exercises Active  Activity Response Tolerated well  Mobility Referral Yes  $Mobility charge 1 Mobility  Mobility Specialist Start Time (ACUTE ONLY) 0900  Mobility Specialist Stop Time (ACUTE ONLY) 0912  Mobility Specialist Time Calculation (min) (ACUTE ONLY) 12 min   Pt was found on BSC and agreeable to ambulate. No complaints with session. At EOS returned to bed with all needs met. Call bell in reach and bed alarm on.  Billey Chang Mobility Specialist

## 2022-10-31 NOTE — Care Management Important Message (Signed)
Important Message  Patient Details IM Letter given. Name: [name] MRN: [id number] Date of Birth: 04-May-1955   Important Message Given:  Yes - Medicare IM     [name] 10/31/2022, 11:05 AM

## 2022-10-31 NOTE — Telephone Encounter (Signed)
Pharmacy Patient Advocate Encounter   Received notification from Fax that prior authorization for Lidocaine 5% patches is required/requested.   Insurance verification completed.   The patient is insured through [employer] .   Per test claim: PA required; PA submitted to [employer] via CoverMyMeds Key/confirmation #/EOC [id number] Status is pending

## 2022-10-31 NOTE — Plan of Care (Signed)
  Problem: Education: Goal: Knowledge of General Education information will improve Description: Including pain rating scale, medication(s)/side effects and non-pharmacologic comfort measures Outcome: Progressing   Problem: Activity: Goal: Risk for activity intolerance will decrease Outcome: Progressing   

## 2022-11-03 ENCOUNTER — Other Ambulatory Visit: Payer: Self-pay

## 2022-11-03 ENCOUNTER — Ambulatory Visit: Payer: Medicare (Managed Care) | Admitting: Obstetrics and Gynecology

## 2022-11-03 VITALS — BP 135/81 | HR 88

## 2022-11-03 DIAGNOSIS — N950 Postmenopausal bleeding: Secondary | ICD-10-CM | POA: Diagnosis not present

## 2022-11-03 DIAGNOSIS — N939 Abnormal uterine and vaginal bleeding, unspecified: Secondary | ICD-10-CM

## 2022-11-03 DIAGNOSIS — Z1330 Encounter for screening examination for mental health and behavioral disorders, unspecified: Secondary | ICD-10-CM | POA: Diagnosis not present

## 2022-11-03 NOTE — Discharge Summary (Signed)
Physician Discharge Summary   Patient: [name] MRN: [id number] DOB: Jun 14, 1955  Admit date:     10/17/2022  Discharge date: 10/31/2022  Discharge Physician: Lynden Oxford  PCP: [name], MD  Recommendations at discharge: Follow-up with PCP in 1 week.   Follow-up Information     [name], MD. Schedule an appointment as soon as possible for a visit in 1 week(s).   Specialty: Family Medicine Why: consider switch from effexor to cymbalta for added benefit of pain control Contact information: [address] [place] [address] [phone]         [name] Follow up.   Why: Home health company Contact information: [address] [place] [address] [phone]                Discharge Diagnoses: Principal Problem:   Hepatic encephalopathy (HCC) Active Problems:   Acute metabolic encephalopathy   Cirrhosis due to NASH   Iron deficiency anemia   Morbid obesity (HCC)  Brief hospital course: Patient is a 67 year old female with known alcoholic fatty liver disease, chronic diarrhea,(takes lactulose only if she does not move bm 3 to 4 times daily)  hypertension, obesity, PE, short gut syndrome, hypothyroidism, cholelithiasis, admitted with increasing confusion.  Patient lives at home with her son.  According to son she has been acting confused  Son noticed that she is more weak and confused than her baseline she had difficulty getting dressed and getting out of the house. Patient was recently admitted to the hospital 09/30/2022 discharged on 10/12/2022. Recent hospital admission she had sepsis secondary to Pseudomonas UTI and bacteremia.  Blood culture grew ESBL Pseudomonas treated with meropenem completed on 10/11/2022.  Follow-up blood cultures were negative.  She had vaginal bleeding she was started on Megace she was due to follow-up with gynecologist later this month.  Son tells me she was also is due to  see infectious disease on 10/17/2022 but has not seen ended up in the hospital. Denies having sleep apnea.  Denies urinary complaints. She has some rash on the center of her chest which apparently is a cat scratch from her cat. Son reported she bites her tongue at times no history of seizures or incontinence reported.   Assessment and Plan: Acute metabolic encephalopathy secondary to hepatic encephalopathy (HCC) MRI brain 10/7 showed no acute intracranial abnormality, moderately advanced chronic microvascular ischemic disease Ammonia on admission 169. Mentation was significantly better.  Worsened due to missing her lactulose and also due to pain medication.  Now improving again.  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   Acute on chronic back pain Patient had kyphoplasty T3 and T5 in 08/2022 X-ray showed compression fracture in the lower thoracic spine, tentatively T12 has progressed on from prior exam. Discussed with the patient, she reported improvement in her back pain after the prior kyphoplasty, IR evaluation requested Appreciate IR evaluation, blood cultures from 10/8 while Staph epidermidis, 1 set likely contaminant.   -Blood cultures 10/11 so far negative Underwent kyphoplasty on 10/17.   Continue current pain medications.   Iron deficiency anemia, ?  Hematemesis, concern for variceal bleeding Placed on octreotide, PPI, GI consulted.    EGD on 10/9 showed normal esophagus, friable gastric mucosa, portal hypertensive gastropathy, normal duodenum.  No active bleeding h/h stable   Chronic hypotension Continue midodrine   Hypothyroidism  Continue Synthroid   Vaginal bleeding Continue Megace 40 mg twice daily, has appointment with GYN in 11/03/2022     Headache. Okay to take Tylenol. Continue as needed pain medication.   Obesity  Class 3 Body mass index is 40.36 kg/m.  Placing the pt at higher risk of poor outcomes.  Paraspinal back pain. Reports pain on the left side paraspinally. Able to ambulate in the hallway though with walker. Suspect this is muscular in nature. Lidocaine patch.  Right facial cellulitis. Treated with Keflex.  Almost resolved.  Pain control - [employer] Controlled Substance Reporting System database was reviewed. and patient was instructed, not to drive, operate heavy machinery, perform activities at heights, swimming or participation in water activities or provide baby-sitting services while on Pain, Sleep and Anxiety Medications; until their outpatient Physician has advised to do so again. Also recommended to not to take more than prescribed Pain, Sleep and Anxiety Medications.  Consultants:  IR  Procedures performed:  Kyphoplasty  DISCHARGE MEDICATION: Allergies as of 10/31/2022       Reactions   Ace Inhibitors Anaphylaxis, Swelling   Ivp Dye [iodinated Contrast Media] Anaphylaxis   Desitin [zinc Oxide] Rash, Other (See Comments)   Worsens rash   Hydroxyzine Other (See Comments)   Affects the mind   Chlorhexidine Rash, Other (See Comments)   WORSENS RASHES   Doxycycline Rash   Penicillins Rash   Zofran [ondansetron] Rash        Medication List     STOP taking these medications    hydrOXYzine 25 MG tablet Commonly known as: ATARAX   magnesium gluconate 500 MG tablet Commonly known as: MAGONATE       TAKE these medications    atorvastatin 40 MG tablet Commonly known as: LIPITOR Take 40 mg by mouth in the morning.   ciclopirox 8 % solution Commonly known as: PENLAC Apply 1 application  topically See admin instructions. Apply over all toenails and surrounding skin at bedtime. Apply daily over previous coat. After seven (7) days, may remove with alcohol and continue cycle.   ciprofloxacin-dexamethasone OTIC suspension Commonly known as: CIPRODEX Place 4  drops into the right ear 2 (two) times daily for 7 days.   colchicine 0.6 MG tablet Take 0.6 mg by mouth daily as needed (gout flares).   cyanocobalamin 1000 MCG tablet Commonly known as: VITAMIN B12 Take 2,000 mcg by mouth every Friday.   dicyclomine 10 MG capsule Commonly known as: BENTYL Take 1 capsule (10 mg total) by mouth 3 (three) times daily as needed for spasms (abdominal cramping).   EPINEPHrine 0.3 mg/0.3 mL Soaj injection Commonly known as: EPI-PEN Inject 0.3 mg into the muscle as needed for anaphylaxis.   folic acid 1 MG tablet Commonly known as: FOLVITE Take 1 tablet (1 mg total) by mouth daily.   furosemide 40 MG tablet Commonly known as: LASIX Take 1 tablet (40 mg total) by mouth daily. Take additional 40 mg if there is a weight gain of 3 lbs in1 day or 5 lbs in 1 week. What changed:  when to take this additional instructions   HYDROcodone-acetaminophen 10-325 MG tablet Commonly known as: NORCO Take 1 tablet by mouth 3 (three) times daily as needed for moderate pain or severe pain.   IRON-VITAMIN C PO Take 1 tablet by mouth daily with breakfast.   lactulose 10 GM/15ML solution Commonly known as: CHRONULAC Take 30 mLs (20 g total) by mouth 3 (three) times daily. Take to ensure that there is  2 loose bowel movement EVERY DAY What changed: additional instructions   levothyroxine 150 MCG tablet Commonly known as: SYNTHROID Take 150 mcg by mouth daily before breakfast.   lidocaine 5 % Commonly known as: LIDODERM Place 1 patch onto the skin daily. Remove & Discard patch within 12 hours or as directed by MD   megestrol 40 MG tablet Commonly known as: MEGACE Take 1 tablet (40 mg total) by mouth 2 (two) times daily.   methocarbamol 500 MG tablet Commonly known as: ROBAXIN Take 500 mg by mouth in the morning.   midodrine 10 MG tablet Commonly known as: PROAMATINE Take 1 tablet (10 mg total) by mouth 3 (three) times daily with meals.   naloxone 4  MG/0.1ML Liqd nasal spray kit Commonly known as: NARCAN Place 1 spray into the nose as needed (accidental overdose).   nystatin powder Apply 1 Application topically 3 (three) times daily as needed (for irritation- affected areas). What changed: Another medication with the same name was changed. Make sure you understand how and when to take each.   nystatin cream Commonly known as: MYCOSTATIN Apply topically 2 (two) times daily. What changed:  how much to take when to take this reasons to take this   pantoprazole 40 MG tablet Commonly known as: PROTONIX Take 1 tablet (40 mg total) by mouth 2 (two) times daily before a meal.   potassium chloride 10 MEQ tablet Commonly known as: KLOR-CON Take 20 mEq by mouth in the morning and at bedtime.   promethazine 12.5 MG tablet Commonly known as: PHENERGAN Take 12.5 mg by mouth every 6 (six) hours as needed for nausea or vomiting.   rifaximin 550 MG Tabs tablet Commonly known as: XIFAXAN Take 550 mg by mouth 2 (two) times daily.   rOPINIRole 0.25 MG tablet Commonly known as: REQUIP Take 1 tablet (0.25 mg total) by mouth at bedtime.   spironolactone 25 MG tablet Commonly known as: ALDACTONE Take 1 tablet (25 mg total) by mouth daily.   tamsulosin 0.4 MG Caps capsule Commonly known as: FLOMAX Take 1 capsule (0.4 mg total) by mouth daily.   triamcinolone cream 0.5 % Commonly known as: KENALOG Apply to affected area of right breast twice daily as directed. What changed:  how much to take when to take this reasons to take this   TYLENOL 500 MG tablet Generic drug: acetaminophen Take 500 mg by mouth every 6 (six) hours as needed for mild pain or headache.   venlafaxine XR 75 MG 24 hr capsule Commonly known as: EFFEXOR-XR Take 75 mg by mouth daily with breakfast.   Vitamin D (Ergocalciferol) 1.25 MG (50000 UNIT) Caps capsule Commonly known as: DRISDOL Take 50,000 Units by mouth every Wednesday.       ASK your doctor  about these medications    cephALEXin 500 MG capsule Commonly known as: KEFLEX Take 1 capsule (500 mg total) by mouth 2 (two) times daily for 2 days. Ask about: Should I take this medication?       Disposition: Home Diet recommendation: Cardiac diet  Discharge Exam: Vitals:   10/30/22 1353 10/30/22 2110 10/31/22 0152 10/31/22 0408  BP: 116/63 (!) 140/68 126/65 (!) 147/73  Pulse: 83 83 76 85  Resp: 20 18 16 18   Temp: 98.2 F (36.8 C) 97.6 F (36.4 C) 98.7 F (37.1 C) 98.7 F (37.1 C)  TempSrc: Oral Oral Oral Oral  SpO2: 96% 100% 100% 100%  Weight:      Height:  General: Appear in mild distress; no visible Abnormal Neck Mass Or lumps, Conjunctiva normal Cardiovascular: S1 and S2 Present, aortic systolic  Murmur, Respiratory: good respiratory effort, Bilateral Air entry present and CTA, no Crackles, no wheezes Abdomen: Bowel Sound present, Non tender  Extremities: bilateral Pedal edema Neurology: alert and oriented to time, place, and person asterixis resolved. Filed Weights   10/17/22 1133 10/17/22 1148 10/22/22 0618  Weight: 100 kg 97.5 kg 100.1 kg   Condition at discharge: stable  The results of significant diagnostics from this hospitalization (including imaging, microbiology, ancillary and laboratory) are listed below for reference.   Imaging Studies: IR KYPHO THORACIC WITH BONE BIOPSY  Result Date: 10/27/2022 CLINICAL DATA:  67 year old female with acute osteoporotic vertebral compression fracture of T12 persistent back pain. EXAM: FLUOROSCOPIC GUIDED KYPHOPLASTY OF THE T12 VERTEBRAL BODY COMPARISON:  None Available. MEDICATIONS: The patient was receiving intravenous antibiotics as an inpatient, no additional antibiotics were administered. ANESTHESIA/SEDATION: Moderate (conscious) sedation was employed during this procedure. A total of Versed 2 mg and Fentanyl 100 mcg was administered intravenously. Moderate Sedation Time: 27 minutes. The patient's level of  consciousness and vital signs were monitored continuously by radiology nursing throughout the procedure under my direct supervision. FLUOROSCOPY TIME:  Four hundred forty-two mGy COMPLICATIONS: None immediate. PROCEDURE: The procedure, risks (including but not limited to bleeding, infection, organ damage), benefits, and alternatives were explained to the patient. Questions regarding the procedure were encouraged and answered. The patient understands and consents to the procedure. The patient has suffered a fracture of the T12 vertebral body. It is recommended that patients aged 21 years or older be evaluated for possible testing or treatment of osteoporosis. A copy of this procedure report is sent to the patient's referring physician The patient was placed prone on the fluoroscopic table. The skin overlying the lower thoracic region was then prepped and draped in the usual sterile fashion. Maximal barrier sterile technique was utilized including caps, mask, sterile gowns, sterile gloves, sterile drape, hand hygiene and skin antiseptic. Intravenous Fentanyl and Versed were administered as conscious sedation during continuous cardiorespiratory monitoring by the radiology RN. The left pedicle at T12 was then infiltrated with 1% lidocaine followed by the advancement of a Kyphon trocar needle through the left pedicle into the posterior one-third of the vertebral body. Subsequently, the osteo drill was advanced to the anterior third of the vertebral body. The osteo drill was retracted. Through the working cannula, a Kyphon inflatable bone tamp 15 x 3 was advanced and positioned with the distal marker approximately 5 mm from the anterior aspect of the cortex. Appropriate positioning was confirmed on the AP projection. At this time, the balloon was expanded using contrast via a Kyphon inflation syringe device via micro tubing. In similar fashion, the right T12 pedicle was infiltrated with 1% lidocaine followed by the  advancement of a second Kyphon trocar needle through the right pedicle into the posterior third of the vertebral body. Subsequently, the osteo drill was coaxially advanced to the anterior right third. The osteo drill was exchanged for a Kyphon inflatable bone tamp 15 x 3, advanced to the 5 mm of the anterior aspect of the cortex. The balloon was then expanded using contrast as above. Inflations were continued until there was near apposition with the superior end plate. At this time, methylmethacrylate mixture was reconstituted in the Kyphon bone mixing device system. This was then loaded into the delivery mechanism, attached to Kyphon bone fillers. The balloons were deflated and removed followed by  the instillation of methylmethacrylate mixture with excellent filling in the AP and lateral projections. No extravasation was noted in the disk spaces or posteriorly into the spinal canal. No epidural venous contamination was seen. The working cannulae and the bone filler were then retrieved and removed. Hemostasis was achieved with manual compression. The patient tolerated the procedure well without immediate postprocedural complication. IMPRESSION: 1. Technically successful T12 vertebral body augmentation using balloon kyphoplasty. 2. Per CMS PQRS reporting requirements (PQRS Measure 24): Given the patient's age of greater than 50 and the fracture site (hip, distal radius, or spine), the patient should be tested for osteoporosis using DXA, and the appropriate treatment considered based on the DXA results. Marliss Coots, MD [facility] Radiology Electronically Signed   By: Marliss Coots M.D.   On: 10/27/2022 13:47   DG Thoracic Spine 2 View  Result Date: 10/20/2022 CLINICAL DATA:  Back pain. EXAM: THORACIC SPINE 2 VIEWS COMPARISON:  Thoracic spine MRI 08/28/2022, reformats from CT 08/29/2022 FINDINGS: There is straightening of normal thoracic kyphosis. The T3 and T5  compression fractures on prior MRI are not well-defined by radiograph. Compression fracture in the lower thoracic spine, tentatively T12, has progressed from prior exam. Radiographic assessment is limited due to soft tissue attenuation from habitus. IMPRESSION: 1. Compression fracture in the lower thoracic spine, tentatively T12, has progressed from prior exam. 2. The T3 and T5 compression fractures on prior MRI are not well-defined by radiograph. 3. Technically limited exam due to habitus. Electronically Signed   By: Narda Rutherford M.D.   On: 10/20/2022 18:28   MR BRAIN WO CONTRAST  Result Date: 10/17/2022 CLINICAL DATA:  Initial evaluation for mental status change, unknown cause. EXAM: MRI HEAD WITHOUT CONTRAST TECHNIQUE: Multiplanar, multiecho pulse sequences of the brain and surrounding structures were obtained without intravenous contrast. COMPARISON:  Prior CT from earlier the same day. FINDINGS: Brain: Examination moderately to severely degraded by motion artifact. Cerebral volume within normal limits. Patchy T2/FLAIR hyperintensity involving the periventricular and deep white matter, most likely root related to chronic microvascular ischemic disease, moderately advanced in nature. No visible foci of restricted diffusion to suggest acute or subacute ischemia. Gray-white matter differentiation maintained. No visible acute or chronic intracranial blood products. No visible mass lesion, midline shift or mass effect. No hydrocephalus or extra-axial fluid collection. Pituitary gland and suprasellar region within normal limits. Vascular: Major intracranial vascular flow voids are grossly maintained. Skull and upper cervical spine: Craniocervical junction within normal limits. Bone marrow signal intensity grossly normal. No scalp soft tissue abnormality. Sinuses/Orbits: Prior ocular lens replacement on the left. Paranasal sinuses are largely clear. No significant mastoid effusion. Other: None. IMPRESSION: 1.  Motion degraded exam. 2. No definite acute intracranial abnormality. 3. Moderately advanced chronic microvascular ischemic disease. Electronically Signed   By: Rise Mu M.D.   On: 10/17/2022 21:05   CT Head Wo Contrast  Result Date: 10/17/2022 CLINICAL DATA:  ams EXAM: CT HEAD WITHOUT CONTRAST TECHNIQUE: Contiguous axial images were obtained from the base of the skull through the vertex without intravenous contrast. RADIATION DOSE REDUCTION: This exam was performed according to the departmental dose-optimization program which includes automated exposure control, adjustment of the mA and/or kV according to patient size and/or use of iterative reconstruction technique. COMPARISON:  CT Head 09/30/22 FINDINGS: Brain: No hemorrhage. No hydrocephalus. No extra-axial fluid collection. No CT evidence of an acute cortical infarct. No mass effect. No mass lesion. There is sequela of mild chronic microvascular ischemic change.  Apparent linear hypodensity through the right cerebellum is likely artifactual and related to streak artifact. Vascular: No hyperdense vessel or unexpected calcification. Skull: Normal. Negative for fracture or focal lesion. Sinuses/Orbits: No middle ear or mastoid effusion. Paranasal sinuses are clear. Left lens replacement. Orbits are otherwise unremarkable. Other: None. IMPRESSION: No acute intracranial abnormality. Apparent linear hypodensity through the right cerebellum is likely artifactual and related to streak artifact. If there is clinical concern for acute infarct, consider further evaluation with MRI. Electronically Signed   By: Lorenza Cambridge M.D.   On: 10/17/2022 14:06    Microbiology: Results for orders placed or performed during the hospital encounter of 10/17/22  Culture, blood (Routine X 2) w Reflex to ID Panel     Status: None   Collection Time: 10/17/22 12:30 AM   Specimen: BLOOD  Result Value Ref Range Status   Specimen Description   Final    BLOOD LEFT  ANTECUBITAL Performed at [facility], [address], [place], [place] [address]    Special Requests   Final    BOTTLES DRAWN AEROBIC AND ANAEROBIC Blood Culture results may not be optimal due to an inadequate volume of blood received in culture bottles Performed at [facility], [address], [place], [place] [address]    Culture   Final    NO GROWTH 5 DAYS Performed at [facility], [address], [place], [place] [address]    Report Status 10/23/2022 FINAL  Final  Culture, blood (Routine X 2) w Reflex to ID Panel     Status: Abnormal   Collection Time: 10/18/22  3:08 PM   Specimen: BLOOD RIGHT HAND  Result Value Ref Range Status   Specimen Description   Final    BLOOD RIGHT HAND Performed at [name], [address], [place], [place] [address]    Special Requests   Final    BOTTLES DRAWN AEROBIC AND ANAEROBIC Blood Culture results may not be optimal due to an inadequate volume of blood received in culture bottles Performed at [facility], [address] [place], [place] [address]    Culture  Setup Time   Final    GRAM POSITIVE COCCI IN CLUSTERS AEROBIC BOTTLE ONLY CRITICAL RESULT CALLED TO, READ BACK BY AND VERIFIED WITH: PHARMD ABBY ELLINGTON [id number] AT 1442 BY CM    Culture (A)  Final    STAPHYLOCOCCUS EPIDERMIDIS THE SIGNIFICANCE OF ISOLATING THIS ORGANISM FROM A SINGLE SET OF BLOOD CULTURES WHEN MULTIPLE SETS ARE DRAWN IS UNCERTAIN. PLEASE NOTIFY THE MICROBIOLOGY DEPARTMENT WITHIN ONE WEEK IF SPECIATION AND SENSITIVITIES ARE REQUIRED. Performed at [facility], [address] [place], [place] [address]    Report Status 10/20/2022 FINAL  Final  Blood Culture ID Panel (Reflexed)     Status: Abnormal   Collection Time: 10/18/22  3:08 PM  Result Value Ref Range Status   Enterococcus faecalis NOT DETECTED NOT DETECTED Final   Enterococcus Faecium NOT DETECTED NOT DETECTED Final   Listeria  monocytogenes NOT DETECTED NOT DETECTED Final   Staphylococcus species DETECTED (A) NOT DETECTED Final    Comment: CRITICAL RESULT CALLED TO, READ BACK BY AND VERIFIED WITH: PHARMD ABBY ELLINGTON [id number] AT 1442 BY CM    Staphylococcus aureus (BCID) NOT DETECTED NOT DETECTED Final   Staphylococcus epidermidis DETECTED (A) NOT DETECTED Final    Comment: Methicillin (oxacillin) resistant coagulase negative staphylococcus. Possible blood culture contaminant (unless isolated from more than one blood  culture draw or clinical case suggests pathogenicity). No antibiotic treatment is indicated for blood  culture contaminants. CRITICAL RESULT CALLED TO, READ BACK BY AND VERIFIED WITH: PHARMD ABBY ELLINGTON [id number] AT 1442 BY CM    Staphylococcus lugdunensis NOT DETECTED NOT DETECTED Final   Streptococcus species NOT DETECTED NOT DETECTED Final   Streptococcus agalactiae NOT DETECTED NOT DETECTED Final   Streptococcus pneumoniae NOT DETECTED NOT DETECTED Final   Streptococcus pyogenes NOT DETECTED NOT DETECTED Final   A.calcoaceticus-baumannii NOT DETECTED NOT DETECTED Final   Bacteroides fragilis NOT DETECTED NOT DETECTED Final   Enterobacterales NOT DETECTED NOT DETECTED Final   Enterobacter cloacae complex NOT DETECTED NOT DETECTED Final   Escherichia coli NOT DETECTED NOT DETECTED Final   Klebsiella aerogenes NOT DETECTED NOT DETECTED Final   Klebsiella oxytoca NOT DETECTED NOT DETECTED Final   Klebsiella pneumoniae NOT DETECTED NOT DETECTED Final   Proteus species NOT DETECTED NOT DETECTED Final   Salmonella species NOT DETECTED NOT DETECTED Final   Serratia marcescens NOT DETECTED NOT DETECTED Final   Haemophilus influenzae NOT DETECTED NOT DETECTED Final   Neisseria meningitidis NOT DETECTED NOT DETECTED Final   Pseudomonas aeruginosa NOT DETECTED NOT DETECTED Final   Stenotrophomonas maltophilia NOT DETECTED NOT DETECTED Final   Candida albicans NOT DETECTED NOT DETECTED Final   Candida  auris NOT DETECTED NOT DETECTED Final   Candida glabrata NOT DETECTED NOT DETECTED Final   Candida krusei NOT DETECTED NOT DETECTED Final   Candida parapsilosis NOT DETECTED NOT DETECTED Final   Candida tropicalis NOT DETECTED NOT DETECTED Final   Cryptococcus neoformans/gattii NOT DETECTED NOT DETECTED Final   Methicillin resistance mecA/C DETECTED (A) NOT DETECTED Final    Comment: CRITICAL RESULT CALLED TO, READ BACK BY AND VERIFIED WITH: PHARMD ABBY [place] [id number] AT 1442 BY CM Performed at [facility], [address], [place], [place] [address]   Culture, blood (Routine X 2) w Reflex to ID Panel     Status: None   Collection Time: 10/21/22  7:23 PM   Specimen: BLOOD  Result Value Ref Range Status   Specimen Description   Final    BLOOD BLOOD RIGHT HAND Performed at [facility] [facility], [address], [place], [place] [address]    Special Requests   Final    BOTTLES DRAWN AEROBIC ONLY Blood Culture adequate volume Performed at [facility], [address], [place], [place] [address]    Culture   Final    NO GROWTH 5 DAYS Performed at [facility], [name], [place], [place] [address]    Report Status 10/26/2022 FINAL  Final  Culture, blood (Routine X 2) w Reflex to ID Panel     Status: None   Collection Time: 10/21/22  7:23 PM   Specimen: BLOOD  Result Value Ref Range Status   Specimen Description   Final    BLOOD BLOOD RIGHT HAND Performed at [facility], [address], [place], [place] [address]    Special Requests   Final    BOTTLES DRAWN AEROBIC ONLY Blood Culture adequate volume Performed at [facility], [address] [place], [place] [address]    Culture   Final    NO GROWTH 5 DAYS Performed at [facility], [address] [place], [place] [address]    Report Status 10/26/2022 FINAL  Final   Labs: CBC: Recent Labs  Lab 10/28/22 0344 10/29/22 0423  10/30/22 0430  WBC 6.0 6.5 5.8  NEUTROABS 4.5 3.1 4.1  HGB 8.2* 8.1* 8.3*  HCT 26.9* 27.8* 28.0*  MCV 97.1 100.4* 98.2  PLT 127* 135* 131*   Basic Metabolic Panel: Recent Labs  Lab 10/28/22 0344 10/29/22 0423 10/30/22 0430  NA 135 137 139  K 3.5 3.2* 3.3*  CL 109 112* 112*  CO2 18* 19* 20*  GLUCOSE 158* 112* 93  BUN 20 22 21   CREATININE 1.07* 1.17* 1.12*  CALCIUM 7.9* 8.0* 8.1*  MG 1.4* 1.9 1.7   Liver Function Tests: Recent Labs  Lab 10/28/22 0344 10/29/22 0423 10/30/22 0430  AST 23 23 24   ALT 16 16 15   ALKPHOS 92 101 94  BILITOT 1.3* 1.2 1.4*  PROT 5.7* 5.7* 5.3*  ALBUMIN 2.2* 2.4* 2.3*   CBG: No results for input(s): "GLUCAP" in the last 168 hours.  Discharge time spent: greater than 30 minutes.  Author: Lynden Oxford, MD  [facility] 10/31/2022

## 2022-11-03 NOTE — Progress Notes (Signed)
NEW GYNECOLOGY PATIENT Patient name: [name] MRN [id number]  Date of birth: 1955-10-11 Chief Complaint:   postmenapausal bleeding     History:  [name] is a 67 y.o. G1P1001 being seen today for PMB.    Discussed the use of AI scribe software for clinical note transcription with the patient, who gave verbal consent to proceed.  History of Present Illness   The patient, with a history of cellulitis, sepsis, and cirrhosis, initially presented with urinary bleeding that began during a hospital admission for cellulitis and sepsis. The bleeding was first noticed following a catheterization procedure in the emergency department. The patient was subsequently admitted to the ICU for ten days, during which no bleeding was reported, despite no specific medication being administered to prevent bleeding.  Upon transfer to a regular ward, the Foley catheter was removed and replaced with a Purewick, after which the bleeding resumed. The patient's typical urinary function outside of the hospital is reported to be normal, with frequent urination due to diuretic medication.  The patient has had multiple hospital admissions since the initial episode, with bleeding recurring each time, often following catheterization. The bleeding was noted to slow down when the patient was taken off blood thinners. The patient also reported a previous episode of bleeding from the clitoris, which was attributed to skin breakdown and resolved after discontinuation of blood thinners.  The patient has not had a menstrual period since 2009 and has a history of fibroids, which were previously inaccessible for removal. The patient also reported a significant fall in her youth, which resulted in injury to the pelvic area.  Since the last hospital discharge, the patient has reported only minor episodes of bleeding, which she attributes to sensitive skin and potential skin breakdown. The patient has been taking Megace, with no  bleeding reported when the medication is taken consistently.           Gynecologic History No LMP recorded. Patient is postmenopausal. 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   pt stated on admission history review   Heart murmur    Hypertension    Kidney stone    Obesity    Pulmonary embolism (HCC)    Short gut syndrome    Thyroid disease     Past Surgical History:  Procedure Laterality Date   CESAREAN SECTION WITH BILATERAL TUBAL LIGATION     CHOLECYSTECTOMY     COLON SURGERY     ESOPHAGOGASTRODUODENOSCOPY (EGD) WITH PROPOFOL N/A 10/19/2022   Procedure: ESOPHAGOGASTRODUODENOSCOPY (EGD) WITH PROPOFOL;  Surgeon: Kerin Salen, MD;  Location: [facility];  Service: Gastroenterology;  Laterality: N/A;   HERNIA REPAIR     ILEOSTOMY     ILEOSTOMY CLOSURE     IR KYPHO THORACIC WITH BONE BIOPSY  08/31/2022   IR KYPHO THORACIC WITH BONE BIOPSY  10/27/2022   IR RADIOLOGIST EVAL & MGMT  09/27/2022   KIDNEY STONE SURGERY  WRIST SURGERY      Current Outpatient Medications on File Prior to Visit  Medication Sig Dispense Refill   atorvastatin (LIPITOR) 40 MG tablet Take 40 mg by mouth in the morning.     ciclopirox (PENLAC) 8 % solution Apply 1 application  topically See admin instructions. Apply over all toenails and surrounding skin at bedtime. Apply daily over previous coat. After seven (7) days, may remove with alcohol and continue cycle.     ciprofloxacin-dexamethasone (CIPRODEX) OTIC suspension Place 4 drops into the right ear 2 (two) times daily for 7 days. 2.8 mL 0    colchicine 0.6 MG tablet Take 0.6 mg by mouth daily as needed (gout flares).     cyanocobalamin (VITAMIN B12) 1000 MCG tablet Take 2,000 mcg by mouth every Friday.     dicyclomine (BENTYL) 10 MG capsule Take 1 capsule (10 mg total) by mouth 3 (three) times daily as needed for spasms (abdominal cramping). 90 capsule 3   EPINEPHrine 0.3 mg/0.3 mL IJ SOAJ injection Inject 0.3 mg into the muscle as needed for anaphylaxis.     folic acid (FOLVITE) 1 MG tablet Take 1 tablet (1 mg total) by mouth daily. 30 tablet 0   furosemide (LASIX) 40 MG tablet Take 1 tablet (40 mg total) by mouth daily. Take additional 40 mg if there is a weight gain of 3 lbs in1 day or 5 lbs in 1 week.     HYDROcodone-acetaminophen (NORCO) 10-325 MG tablet Take 1 tablet by mouth 3 (three) times daily as needed for moderate pain or severe pain.     IRON-VITAMIN C PO Take 1 tablet by mouth daily with breakfast.     lactulose (CHRONULAC) 10 GM/15ML solution Take 30 mLs (20 g total) by mouth 3 (three) times daily. Take to ensure that there is 2 loose bowel movement EVERY DAY 946 mL 2   levothyroxine (SYNTHROID) 150 MCG tablet Take 150 mcg by mouth daily before breakfast.     lidocaine (LIDODERM) 5 % Place 1 patch onto the skin daily. Remove & Discard patch within 12 hours or as directed by MD 30 patch 0   megestrol (MEGACE) 40 MG tablet Take 1 tablet (40 mg total) by mouth 2 (two) times daily. 60 tablet 0   methocarbamol (ROBAXIN) 500 MG tablet Take 500 mg by mouth in the morning.     midodrine (PROAMATINE) 10 MG tablet Take 1 tablet (10 mg total) by mouth 3 (three) times daily with meals. 90 tablet 3   naloxone (NARCAN) nasal spray 4 mg/0.1 mL Place 1 spray into the nose as needed (accidental overdose).     nystatin cream (MYCOSTATIN) Apply topically 2 (two) times daily. (Patient taking differently: Apply 1 Application topically 2 (two) times daily as needed for dry skin (or irritation).) 30 g 0   nystatin powder Apply 1 Application  topically 3 (three) times daily as needed (for irritation- affected areas).     pantoprazole (PROTONIX) 40 MG tablet Take 1 tablet (40 mg total) by mouth 2 (two) times daily before a meal. 60 tablet 3   potassium chloride (KLOR-CON) 10 MEQ tablet Take 20 mEq by mouth in the morning and at bedtime.     promethazine (PHENERGAN) 12.5 MG tablet Take 12.5 mg by mouth every 6 (six) hours as needed for nausea or vomiting.     rifaximin (XIFAXAN) 550 MG TABS tablet Take 550 mg by mouth 2 (two) times daily.     rOPINIRole (REQUIP) 0.25 MG tablet Take  1 tablet (0.25 mg total) by mouth at bedtime. 30 tablet 0   spironolactone (ALDACTONE) 25 MG tablet Take 1 tablet (25 mg total) by mouth daily. 30 tablet 3   tamsulosin (FLOMAX) 0.4 MG CAPS capsule Take 1 capsule (0.4 mg total) by mouth daily. 30 capsule 1   triamcinolone cream (KENALOG) 0.5 % Apply to affected area of right breast twice daily as directed. (Patient taking differently: Apply 1 Application topically 2 (two) times daily as needed (for irritation- right breast).) 30 g 0   TYLENOL 500 MG tablet Take 500 mg by mouth every 6 (six) hours as needed for mild pain or headache.     venlafaxine XR (EFFEXOR-XR) 75 MG 24 hr capsule Take 75 mg by mouth daily with breakfast.     Vitamin D, Ergocalciferol, (DRISDOL) 50000 UNITS CAPS Take 50,000 Units by mouth every Wednesday.     No current facility-administered medications on file prior to visit.    Allergies  Allergen Reactions   Ace Inhibitors Anaphylaxis and Swelling   Ivp Dye [Iodinated Contrast Media] Anaphylaxis   Desitin [Zinc Oxide] Rash and Other (See Comments)    Worsens rash   Hydroxyzine Other (See Comments)    Affects the mind   Chlorhexidine Rash and Other (See Comments)    WORSENS RASHES   Doxycycline Rash   Penicillins Rash   Zofran [Ondansetron] Rash    Social History:  reports that she quit smoking about 37 years ago. Her smoking use included cigarettes. She has been exposed to  tobacco smoke. She has never used smokeless tobacco. She reports that she does not drink alcohol and does not use drugs.  No family history on file.  The following portions of the patient's history were reviewed and updated as appropriate: allergies, current medications, past family history, past medical history, past social history, past surgical history and problem list.  Review of Systems Pertinent items noted in HPI and remainder of comprehensive ROS otherwise negative.  Physical Exam:  BP 135/81   Pulse 88  Physical Exam  Physical Exam   NAD sitting in wheelchair, normal respiratory effort responding in clear and complete sentences        Assessment and Plan:   1. Vaginal bleeding 2. Postmenopausal bleeding     Postmenopausal Bleeding   Intermittent vaginal bleeding may be related to anticoagulation therapy, with no bleeding observed during recent hospital procedures. The need to rule out malignancy as a cause of postmenopausal bleeding was discussed. We will schedule an endometrial biopsy and Pap smear and continue Megace until further evaluation. Reviewed possible non-uterine source of bleeding and can do further evaluation during follow up pelvic exam. offered exam and EMB today and patient prefers to return to have this completed with an anxiolytic.   Skin Breakdown   She reports skin breakdown and itching, possibly due to sensitive skin and scratching. We recommend evaluation and treatment as needed during follow-up visits.         Routine preventative health maintenance measures emphasized. Please refer to After Visit Summary for other counseling recommendations.   Follow-up: No follow-ups on file.      Lorriane Shire, MD Obstetrician & Gynecologist, Faculty [facility] [employer], [facility]

## 2022-11-14 LAB — HM MAMMOGRAPHY: HM Mammogram: NORMAL (ref 0–4)

## 2022-11-29 ENCOUNTER — Encounter: Payer: Self-pay | Admitting: Obstetrics and Gynecology

## 2022-11-30 ENCOUNTER — Other Ambulatory Visit: Payer: Self-pay

## 2022-11-30 ENCOUNTER — Inpatient Hospital Stay (HOSPITAL_COMMUNITY)
Admission: EM | Admit: 2022-11-30 | Discharge: 2022-12-05 | DRG: 442 | Disposition: A | Discharge status: Home / Self Care | Payer: Medicare (Managed Care) | Attending: Internal Medicine | Admitting: Internal Medicine

## 2022-11-30 ENCOUNTER — Other Ambulatory Visit: Payer: Self-pay | Admitting: *Deleted

## 2022-11-30 DIAGNOSIS — D509 Iron deficiency anemia, unspecified: Secondary | ICD-10-CM | POA: Diagnosis present

## 2022-11-30 DIAGNOSIS — K90829 Short bowel syndrome, unspecified: Secondary | ICD-10-CM | POA: Diagnosis present

## 2022-11-30 DIAGNOSIS — H179 Unspecified corneal scar and opacity: Secondary | ICD-10-CM | POA: Diagnosis present

## 2022-11-30 DIAGNOSIS — K529 Noninfective gastroenteritis and colitis, unspecified: Secondary | ICD-10-CM | POA: Diagnosis present

## 2022-11-30 DIAGNOSIS — Z91041 Radiographic dye allergy status: Secondary | ICD-10-CM

## 2022-11-30 DIAGNOSIS — M549 Dorsalgia, unspecified: Secondary | ICD-10-CM | POA: Diagnosis present

## 2022-11-30 DIAGNOSIS — K7581 Nonalcoholic steatohepatitis (NASH): Secondary | ICD-10-CM | POA: Diagnosis present

## 2022-11-30 DIAGNOSIS — Z880 Allergy status to penicillin: Secondary | ICD-10-CM | POA: Diagnosis not present

## 2022-11-30 DIAGNOSIS — N939 Abnormal uterine and vaginal bleeding, unspecified: Secondary | ICD-10-CM

## 2022-11-30 DIAGNOSIS — E860 Dehydration: Secondary | ICD-10-CM | POA: Diagnosis present

## 2022-11-30 DIAGNOSIS — I9589 Other hypotension: Secondary | ICD-10-CM | POA: Diagnosis not present

## 2022-11-30 DIAGNOSIS — E039 Hypothyroidism, unspecified: Secondary | ICD-10-CM | POA: Diagnosis present

## 2022-11-30 DIAGNOSIS — E876 Hypokalemia: Secondary | ICD-10-CM | POA: Diagnosis present

## 2022-11-30 DIAGNOSIS — E669 Obesity, unspecified: Secondary | ICD-10-CM | POA: Diagnosis present

## 2022-11-30 DIAGNOSIS — Z6841 Body Mass Index (BMI) 40.0 and over, adult: Secondary | ICD-10-CM | POA: Diagnosis not present

## 2022-11-30 DIAGNOSIS — K7682 Hepatic encephalopathy: Principal | ICD-10-CM | POA: Diagnosis present

## 2022-11-30 DIAGNOSIS — I10 Essential (primary) hypertension: Secondary | ICD-10-CM | POA: Diagnosis present

## 2022-11-30 DIAGNOSIS — Z888 Allergy status to other drugs, medicaments and biological substances status: Secondary | ICD-10-CM

## 2022-11-30 DIAGNOSIS — Z86711 Personal history of pulmonary embolism: Secondary | ICD-10-CM | POA: Diagnosis not present

## 2022-11-30 DIAGNOSIS — K219 Gastro-esophageal reflux disease without esophagitis: Secondary | ICD-10-CM | POA: Diagnosis present

## 2022-11-30 DIAGNOSIS — Z79899 Other long term (current) drug therapy: Secondary | ICD-10-CM | POA: Diagnosis not present

## 2022-11-30 DIAGNOSIS — Z66 Do not resuscitate: Secondary | ICD-10-CM | POA: Diagnosis present

## 2022-11-30 DIAGNOSIS — Z79890 Hormone replacement therapy (postmenopausal): Secondary | ICD-10-CM

## 2022-11-30 DIAGNOSIS — E66812 Obesity, class 2: Secondary | ICD-10-CM | POA: Diagnosis present

## 2022-11-30 DIAGNOSIS — G8929 Other chronic pain: Secondary | ICD-10-CM | POA: Diagnosis present

## 2022-11-30 DIAGNOSIS — K7460 Unspecified cirrhosis of liver: Secondary | ICD-10-CM | POA: Diagnosis present

## 2022-11-30 DIAGNOSIS — I959 Hypotension, unspecified: Secondary | ICD-10-CM | POA: Diagnosis present

## 2022-11-30 DIAGNOSIS — Z87891 Personal history of nicotine dependence: Secondary | ICD-10-CM | POA: Diagnosis not present

## 2022-11-30 DIAGNOSIS — Z8619 Personal history of other infectious and parasitic diseases: Secondary | ICD-10-CM | POA: Diagnosis not present

## 2022-11-30 DIAGNOSIS — H1789 Other corneal scars and opacities: Secondary | ICD-10-CM

## 2022-11-30 HISTORY — DX: Hepatic encephalopathy: K76.82

## 2022-11-30 LAB — CBC WITH DIFFERENTIAL/PLATELET
Abs Immature Granulocytes: 0.01 10*3/uL (ref 0.00–0.07)
Basophils Absolute: 0.1 10*3/uL (ref 0.0–0.1)
Basophils Relative: 1 %
Eosinophils Absolute: 0.3 10*3/uL (ref 0.0–0.5)
Eosinophils Relative: 5 %
HCT: 30.9 % — ABNORMAL LOW (ref 36.0–46.0)
Hemoglobin: 9.5 g/dL — ABNORMAL LOW (ref 12.0–15.0)
Immature Granulocytes: 0 %
Lymphocytes Relative: 29 %
Lymphs Abs: 1.7 10*3/uL (ref 0.7–4.0)
MCH: 29.2 pg (ref 26.0–34.0)
MCHC: 30.7 g/dL (ref 30.0–36.0)
MCV: 95.1 fL (ref 80.0–100.0)
Monocytes Absolute: 0.6 10*3/uL (ref 0.1–1.0)
Monocytes Relative: 11 %
Neutro Abs: 3.1 10*3/uL (ref 1.7–7.7)
Neutrophils Relative %: 54 %
Platelets: 168 10*3/uL (ref 150–400)
RBC: 3.25 MIL/uL — ABNORMAL LOW (ref 3.87–5.11)
RDW: 17 % — ABNORMAL HIGH (ref 11.5–15.5)
WBC: 5.8 10*3/uL (ref 4.0–10.5)
nRBC: 0 % (ref 0.0–0.2)

## 2022-11-30 LAB — COMPREHENSIVE METABOLIC PANEL
ALT: 18 U/L (ref 0–44)
AST: 30 U/L (ref 15–41)
Albumin: 2.7 g/dL — ABNORMAL LOW (ref 3.5–5.0)
Alkaline Phosphatase: 106 U/L (ref 38–126)
Anion gap: 8 (ref 5–15)
BUN: 19 mg/dL (ref 8–23)
CO2: 22 mmol/L (ref 22–32)
Calcium: 8.3 mg/dL — ABNORMAL LOW (ref 8.9–10.3)
Chloride: 106 mmol/L (ref 98–111)
Creatinine, Ser: 1.18 mg/dL — ABNORMAL HIGH (ref 0.44–1.00)
GFR, Estimated: 51 mL/min — ABNORMAL LOW (ref >60)
Glucose, Bld: 165 mg/dL — ABNORMAL HIGH (ref 70–99)
Potassium: 3.3 mmol/L — ABNORMAL LOW (ref 3.5–5.1)
Sodium: 136 mmol/L (ref 135–145)
Total Bilirubin: 1.8 mg/dL — ABNORMAL HIGH (ref <1.2)
Total Protein: 6.3 g/dL — ABNORMAL LOW (ref 6.5–8.1)

## 2022-11-30 LAB — URINALYSIS, W/ REFLEX TO CULTURE (INFECTION SUSPECTED)
Bilirubin Urine: NEGATIVE
Glucose, UA: NEGATIVE mg/dL
Ketones, ur: NEGATIVE mg/dL
Nitrite: NEGATIVE
Protein, ur: NEGATIVE mg/dL
Specific Gravity, Urine: 1.014 (ref 1.005–1.030)
pH: 5 (ref 5.0–8.0)

## 2022-11-30 LAB — LIPASE, BLOOD: Lipase: 55 U/L — ABNORMAL HIGH (ref 11–51)

## 2022-11-30 LAB — CBG MONITORING, ED: Glucose-Capillary: 141 mg/dL — ABNORMAL HIGH (ref 70–99)

## 2022-11-30 LAB — TROPONIN I (HIGH SENSITIVITY)
Troponin I (High Sensitivity): 18 ng/L — ABNORMAL HIGH (ref <18)
Troponin I (High Sensitivity): 19 ng/L — ABNORMAL HIGH (ref <18)

## 2022-11-30 LAB — AMMONIA: Ammonia: 152 umol/L — ABNORMAL HIGH (ref 9–35)

## 2022-11-30 MED ORDER — VENLAFAXINE HCL ER 75 MG PO CP24
75.0000 mg | ORAL_CAPSULE | Freq: Every day | ORAL | Status: DC
  Administered 2022-12-01 – 2022-12-05 (×5): 75 mg via ORAL
  Filled 2022-11-30 (×5): qty 1

## 2022-11-30 MED ORDER — POTASSIUM CHLORIDE ER 10 MEQ PO TBCR
20.0000 meq | EXTENDED_RELEASE_TABLET | Freq: Two times a day (BID) | ORAL | Status: DC
  Administered 2022-12-01 – 2022-12-05 (×9): 20 meq via ORAL
  Filled 2022-11-30 (×17): qty 2

## 2022-11-30 MED ORDER — MIDODRINE HCL 5 MG PO TABS
10.0000 mg | ORAL_TABLET | Freq: Three times a day (TID) | ORAL | Status: DC
  Administered 2022-12-01 – 2022-12-05 (×13): 10 mg via ORAL
  Filled 2022-11-30 (×13): qty 2

## 2022-11-30 MED ORDER — ENOXAPARIN SODIUM 40 MG/0.4ML IJ SOSY
40.0000 mg | PREFILLED_SYRINGE | Freq: Every day | INTRAMUSCULAR | Status: DC
Start: 2022-11-30 — End: 2022-12-05
  Administered 2022-11-30 – 2022-12-04 (×5): 40 mg via SUBCUTANEOUS
  Filled 2022-11-30 (×5): qty 0.4

## 2022-11-30 MED ORDER — FUROSEMIDE 40 MG PO TABS
40.0000 mg | ORAL_TABLET | Freq: Two times a day (BID) | ORAL | Status: DC
  Administered 2022-12-01 – 2022-12-04 (×7): 40 mg via ORAL
  Filled 2022-11-30 (×7): qty 1

## 2022-11-30 MED ORDER — MEGESTROL ACETATE 40 MG PO TABS: 40.0000 mg | ORAL_TABLET | Freq: Two times a day (BID) | ORAL | 0 refills | Status: DC

## 2022-11-30 MED ORDER — METHOCARBAMOL 500 MG PO TABS
500.0000 mg | ORAL_TABLET | Freq: Every day | ORAL | Status: DC
  Administered 2022-12-01 – 2022-12-05 (×5): 500 mg via ORAL
  Filled 2022-11-30 (×5): qty 1

## 2022-11-30 MED ORDER — TAMSULOSIN HCL 0.4 MG PO CAPS
0.4000 mg | ORAL_CAPSULE | Freq: Every day | ORAL | Status: DC
  Administered 2022-12-01 – 2022-12-05 (×5): 0.4 mg via ORAL
  Filled 2022-11-30 (×5): qty 1

## 2022-11-30 MED ORDER — LEVOTHYROXINE SODIUM 150 MCG PO TABS
150.0000 ug | ORAL_TABLET | Freq: Every day | ORAL | Status: DC
  Administered 2022-12-01 – 2022-12-05 (×5): 150 ug via ORAL
  Filled 2022-11-30 (×5): qty 1

## 2022-11-30 MED ORDER — ATORVASTATIN CALCIUM 40 MG PO TABS
40.0000 mg | ORAL_TABLET | Freq: Every day | ORAL | Status: DC
  Administered 2022-12-01 – 2022-12-05 (×5): 40 mg via ORAL
  Filled 2022-11-30 (×5): qty 1

## 2022-11-30 MED ORDER — ROPINIROLE HCL 0.25 MG PO TABS
0.2500 mg | ORAL_TABLET | Freq: Every day | ORAL | Status: DC
  Administered 2022-12-01 – 2022-12-05 (×5): 0.25 mg via ORAL
  Filled 2022-11-30 (×5): qty 1

## 2022-11-30 MED ORDER — RIFAXIMIN 550 MG PO TABS
550.0000 mg | ORAL_TABLET | Freq: Two times a day (BID) | ORAL | Status: DC
  Administered 2022-12-01 – 2022-12-05 (×9): 550 mg via ORAL
  Filled 2022-11-30 (×9): qty 1

## 2022-11-30 MED ORDER — LACTULOSE 10 GM/15ML PO SOLN
20.0000 g | Freq: Once | ORAL | Status: AC
  Administered 2022-11-30: 20 g via ORAL
  Filled 2022-11-30: qty 30

## 2022-11-30 MED ORDER — SPIRONOLACTONE 25 MG PO TABS
25.0000 mg | ORAL_TABLET | Freq: Every day | ORAL | Status: DC
  Administered 2022-12-01 – 2022-12-05 (×5): 25 mg via ORAL
  Filled 2022-11-30 (×5): qty 1

## 2022-11-30 MED ORDER — MEGESTROL ACETATE 40 MG PO TABS
40.0000 mg | ORAL_TABLET | Freq: Two times a day (BID) | ORAL | Status: DC
  Administered 2022-12-01 – 2022-12-05 (×9): 40 mg via ORAL
  Filled 2022-11-30 (×9): qty 1

## 2022-11-30 MED ORDER — POTASSIUM CHLORIDE CRYS ER 20 MEQ PO TBCR
40.0000 meq | EXTENDED_RELEASE_TABLET | Freq: Once | ORAL | Status: AC
  Administered 2022-11-30: 40 meq via ORAL
  Filled 2022-11-30: qty 2

## 2022-11-30 MED ORDER — HYDROCODONE-ACETAMINOPHEN 10-325 MG PO TABS
1.0000 | ORAL_TABLET | Freq: Three times a day (TID) | ORAL | Status: DC | PRN
  Administered 2022-12-04: 1 via ORAL
  Filled 2022-11-30: qty 1

## 2022-11-30 MED ORDER — FAMOTIDINE 20 MG PO TABS
20.0000 mg | ORAL_TABLET | Freq: Every day | ORAL | Status: DC
  Administered 2022-12-01 – 2022-12-05 (×5): 20 mg via ORAL
  Filled 2022-11-30 (×5): qty 1

## 2022-11-30 MED ORDER — PANTOPRAZOLE SODIUM 40 MG PO TBEC
40.0000 mg | DELAYED_RELEASE_TABLET | Freq: Two times a day (BID) | ORAL | Status: DC
  Administered 2022-12-01 – 2022-12-05 (×9): 40 mg via ORAL
  Filled 2022-11-30 (×9): qty 1

## 2022-11-30 NOTE — Assessment & Plan Note (Addendum)
11-30-2022  Patient developed vaginal bleeding during her hospitalization for ESBL UTI and bacteremia in September.  She has been on Megace 40mg  BID.  -She had follow-up with GYN on 10/30/2022 and still awaiting appointment to undergo endometrial biopsy and Pap smear and to continue Megace until further evaluation.No recent vaginal bleeding.

## 2022-11-30 NOTE — ED Triage Notes (Addendum)
Arrives POV with family for confusion, weakness since yesterday. Pt has a hx of cirrhosis, last took lactulose last night. Pt family reports stool looked like clay. Pt also has bilateral leg swelling.

## 2022-11-30 NOTE — ED Provider Triage Note (Signed)
Emergency Medicine Provider Triage Evaluation Note  [name] , a 68 y.o. female  was evaluated in triage.  Pt complains of confusion and weakness.  Review of Systems  Positive: Confusion, weakness, clay stools, fatigue Negative: Urinary symptoms, shortness of breath, dizziness,  Physical Exam  BP 124/65 (BP Location: Left Arm)   Pulse 87   Temp 98.6 F (37 C) (Oral)   Resp 18   SpO2 100%  Gen:   Awake, no distress   Resp:  Normal effort  MSK:   Moves extremities without difficulty  Other:    Medical Decision Making  Medically screening exam initiated at 5:45 PM.  Appropriate orders placed.  [name] was informed that the remainder of the evaluation will be completed by another provider, this initial triage assessment does not replace that evaluation, and the importance of remaining in the ED until their evaluation is complete.  EKG, ammonia, CBC, CMP, UA, troponin, CBG, lipase ordered   Kara Jenny, PA-C 11/30/22 1747

## 2022-11-30 NOTE — ED Provider Notes (Signed)
EMERGENCY DEPARTMENT AT [facility] Provider Note   CSN: [id number] Arrival date & time: 11/30/22  1638     History  Chief Complaint  Patient presents with   Altered Mental Status   Leg Swelling    [name] is a 67 y.o. female past medical history significant for cirrhosis due to NASH presents today with confusion and weakness since yesterday.  Patient took lactulose last night.  Patient's son states that her stools look like clay.  Patient denies any abdominal pain, trauma, urinary symptoms, headache, nausea, vomiting, or constipation.   Altered Mental Status Presenting symptoms: confusion   Associated symptoms: weakness        Home Medications Prior to Admission medications   Medication Sig Start Date End Date Taking? Authorizing Provider  atorvastatin (LIPITOR) 40 MG tablet Take 40 mg by mouth in the morning. 01/19/22   [provider]  colchicine 0.6 MG tablet Take 0.6 mg by mouth daily as needed (gout flares).    [provider]  cyanocobalamin (VITAMIN B12) 1000 MCG tablet Take 2,000 mcg by mouth every Friday.    [provider]  dicyclomine (BENTYL) 10 MG capsule Take 1 capsule (10 mg total) by mouth 3 (three) times daily as needed for spasms (abdominal cramping). 09/09/22   Lewie Chamber, MD  EPINEPHrine 0.3 mg/0.3 mL IJ SOAJ injection Inject 0.3 mg into the muscle as needed for anaphylaxis. 08/07/19   [provider]  folic acid (FOLVITE) 1 MG tablet Take 1 tablet (1 mg total) by mouth daily. 06/16/22   Calvert Cantor, MD  furosemide (LASIX) 40 MG tablet Take 1 tablet (40 mg total) by mouth daily. Take additional 40 mg if there is a weight gain of 3 lbs in1 day or 5 lbs in 1 week. 10/31/22   Rolly Salter, MD  HYDROcodone-acetaminophen (NORCO) 10-325 MG tablet Take 1 tablet by mouth 3 (three) times daily as needed for moderate pain or severe pain. 02/25/22   [provider]  IRON-VITAMIN C PO Take 1  tablet by mouth daily with breakfast.    [provider]  lactulose (CHRONULAC) 10 GM/15ML solution Take 30 mLs (20 g total) by mouth 3 (three) times daily. Take to ensure that there is 2 loose bowel movement EVERY DAY 10/31/22   Rolly Salter, MD  levothyroxine (SYNTHROID) 150 MCG tablet Take 150 mcg by mouth daily before breakfast. 10/26/21   [provider]  lidocaine (LIDODERM) 5 % Place 1 patch onto the skin daily. Remove & Discard patch within 12 hours or as directed by MD 10/31/22   Rolly Salter, MD  megestrol (MEGACE) 40 MG tablet Take 1 tablet (40 mg total) by mouth 2 (two) times daily. 11/30/22   Lorriane Shire, MD  methocarbamol (ROBAXIN) 500 MG tablet Take 500 mg by mouth in the morning. 03/04/22   [provider]  midodrine (PROAMATINE) 10 MG tablet Take 1 tablet (10 mg total) by mouth 3 (three) times daily with meals. 09/09/22   Lewie Chamber, MD  naloxone [facility]) nasal spray 4 mg/0.1 mL Place 1 spray into the nose as needed (accidental overdose). 03/25/22   [provider]  nystatin cream (MYCOSTATIN) Apply topically 2 (two) times daily. Patient taking differently: Apply 1 Application topically 2 (two) times daily as needed for dry skin (or irritation). 05/22/22   Leatha Gilding, MD  nystatin powder Apply 1 Application topically 3 (three) times daily as needed (for irritation- affected areas).  [provider]  pantoprazole (PROTONIX) 40 MG tablet Take 1 tablet (40 mg total) by mouth 2 (two) times daily before a meal. 09/09/22   Lewie Chamber, MD  potassium chloride (KLOR-CON) 10 MEQ tablet Take 20 mEq by mouth in the morning and at bedtime.    [provider]  promethazine (PHENERGAN) 12.5 MG tablet Take 12.5 mg by mouth every 6 (six) hours as needed for nausea or vomiting.    [provider]  rifaximin (XIFAXAN) 550 MG TABS tablet Take 550 mg by mouth 2 (two) times daily.    [provider]  rOPINIRole  (REQUIP) 0.25 MG tablet Take 1 tablet (0.25 mg total) by mouth at bedtime. 09/09/22   Lewie Chamber, MD  spironolactone (ALDACTONE) 25 MG tablet Take 1 tablet (25 mg total) by mouth daily. 09/10/22   Lewie Chamber, MD  tamsulosin (FLOMAX) 0.4 MG CAPS capsule Take 1 capsule (0.4 mg total) by mouth daily. 09/10/22   Lewie Chamber, MD  triamcinolone cream (KENALOG) 0.5 % Apply to affected area of right breast twice daily as directed. Patient taking differently: Apply 1 Application topically 2 (two) times daily as needed (for irritation- right breast). 05/10/22   Osvaldo Shipper, MD  TYLENOL 500 MG tablet Take 500 mg by mouth every 6 (six) hours as needed for mild pain or headache.    [provider]  venlafaxine XR (EFFEXOR-XR) 75 MG 24 hr capsule Take 75 mg by mouth daily with breakfast. 08/17/21   [provider]  Vitamin D, Ergocalciferol, (DRISDOL) 50000 UNITS CAPS Take 50,000 Units by mouth every Wednesday.    [provider]      Allergies    Ace inhibitors, Ivp dye [iodinated contrast media], Desitin [zinc oxide], Gadolinium derivatives, Hydroxyzine, Chlorhexidine, Doxycycline, Penicillins, and Zofran [ondansetron]    Review of Systems   Review of Systems  Neurological:  Positive for weakness.  Psychiatric/Behavioral:  Positive for confusion.     Physical Exam Updated Vital Signs BP 124/76   Pulse 79   Temp 98.6 F (37 C) (Oral)   Resp 16   SpO2 100%  Physical Exam Vitals and nursing note reviewed.  Constitutional:      General: She is not in acute distress.    Appearance: She is well-developed.  HENT:     Head: Normocephalic and atraumatic.     Right Ear: External ear normal.     Left Ear: External ear normal.  Eyes:     Conjunctiva/sclera: Conjunctivae normal.  Cardiovascular:     Rate and Rhythm: Normal rate and regular rhythm.     Heart sounds: No murmur heard. Pulmonary:     Effort: Pulmonary effort is normal. No respiratory distress.      Breath sounds: Normal breath sounds.  Abdominal:     Palpations: Abdomen is soft.     Tenderness: There is no abdominal tenderness.  Musculoskeletal:        General: No swelling.     Cervical back: Neck supple.  Skin:    General: Skin is warm and dry.     Capillary Refill: Capillary refill takes less than 2 seconds.     Comments: Mild jaundice  Neurological:     Mental Status: She is alert.     Motor: No weakness.     Comments: Oriented to person, place, and situation but not time  Psychiatric:        Mood and Affect: Mood normal.     ED Results / Procedures /  Treatments   Labs (all labs ordered are listed, but only abnormal results are displayed) Labs Reviewed  COMPREHENSIVE METABOLIC PANEL - Abnormal; Notable for the following components:      Result Value   Potassium 3.3 (*)    Glucose, Bld 165 (*)    Creatinine, Ser 1.18 (*)    Calcium 8.3 (*)    Total Protein 6.3 (*)    Albumin 2.7 (*)    Total Bilirubin 1.8 (*)    GFR, Estimated 51 (*)    All other components within normal limits  CBC WITH DIFFERENTIAL/PLATELET - Abnormal; Notable for the following components:   RBC 3.25 (*)    Hemoglobin 9.5 (*)    HCT 30.9 (*)    RDW 17.0 (*)    All other components within normal limits  AMMONIA - Abnormal; Notable for the following components:   Ammonia 152 (*)    All other components within normal limits  URINALYSIS, W/ REFLEX TO CULTURE (INFECTION SUSPECTED) - Abnormal; Notable for the following components:   APPearance HAZY (*)    Hgb urine dipstick MODERATE (*)    Leukocytes,Ua TRACE (*)    Bacteria, UA RARE (*)    All other components within normal limits  LIPASE, BLOOD - Abnormal; Notable for the following components:   Lipase 55 (*)    All other components within normal limits  CBG MONITORING, ED - Abnormal; Notable for the following components:   Glucose-Capillary 141 (*)    All other components within normal limits  TROPONIN I (HIGH SENSITIVITY) - Abnormal;  Notable for the following components:   Troponin I (High Sensitivity) 18 (*)    All other components within normal limits  TROPONIN I (HIGH SENSITIVITY)    EKG None  Radiology No results found.  Procedures Procedures    Medications Ordered in ED Medications  lactulose (CHRONULAC) 10 GM/15ML solution 20 g (has no administration in time range)    ED Course/ Medical Decision Making/ A&P                                 Medical Decision Making Amount and/or Complexity of Data Reviewed Labs: ordered.  Risk Prescription drug management.   This patient presents to the ED with chief complaint(s) of altered mental status with pertinent past medical history of cirrhosis which further complicates the presenting complaint. The complaint involves an extensive differential diagnosis and also carries with it a high risk of complications and morbidity.    The differential diagnosis includes hepatic encephalopathy, UTI,   ED Course and Reassessment:   Independent labs interpretation:  The following labs were independently interpreted:  EKG: Sinus with borderline prolonged PR interval Ammonia: 152 CBC: Anemia which is chronic per her historical values CMP: Mild hyponatremia, mildly elevated creatinine at 1.18, mild hypocalcemia, decreased albumin, elevated total bili at 1.8 Lipase: 55 UA: Moderate hemoglobin, trace leuks, rare bacteria, 11-20 WBCs CBG: 141 Troponin: 18 which is chronically elevated per historical values   Consultation: - Consulted or discussed management/test interpretation w/ external professional: Dr. Deno Etienne with hospitalist who agreed to admission  Consideration for admission or further workup: Admission for hepatic encephalopathy         Final Clinical Impression(s) / ED Diagnoses Final diagnoses:  Hepatic encephalopathy [facility])    Rx / DC Orders ED Discharge Orders     None         Dolphus Jenny, PA-C 11/30/22  2052    Jacalyn Lefevre,  MD 11/30/22 2202

## 2022-11-30 NOTE — H&P (Signed)
History and Physical    Patient: [name] WUJ:[id number] DOB: 1955/04/03 DOA: 11/30/2022 DOS: the patient was seen and examined on 11/30/2022 PCP: [name], MD  Patient coming from: Home  Chief Complaint:  Chief Complaint  Patient presents with   Altered Mental Status   Leg Swelling   HPI: [name] is a 67 y.o. female with medical history significant of alcohol fatty liver cirrhosis, chronic diarrhea, hypertension, obesity, PE/DVT, short gut syndrome, hypothyroidism who presents with increasing confusion.  History obtained documentation and son over the phone. Pt is a limited historian.   Son brought her in today because she was more confused and had less bowel movement. Son gives her Xifaxan daily and only gives her Lactulose as needed at the instruction of GI because she has chronic diarrhea. If she took Lactulose daily she would have at least 6 bowel movements and get dehydrated.  Son has a special word he ask her and if she can't give that word or if she talks slow those would be signs he looks for to give her lactulose.  Last week she had lab work showing elevated ammonia in the 100 and had less bowel movement so son did give her a few doses of Lactulose. Today prior to coming to ED he gave her a dose of Lactulose and reports already 6 small bowel movements. She then had a large one in ED right after receiving additional Lactulose here.   Of note, Patient recently hospitalized from 10/7 to 10/21 with hepatic encephalopathy due to missing her lactulose.  She also underwent kyphoplasty on 10/17 due to acute on chronic back pain T12 compression fracture.  Prior to that she was hospitalized from 09/30/2022 to 10/12/2022 for sepsis secondary to Pseudomonas UTI with bacteremia and completed a course of meropenem.   On arrival to the ED, she was afebrile and normotensive on room air.  CBC without leukocytosis, hemoglobin at of 9.5 with a baseline of 8.  Platelets within  normal limits.  CMP with mild hypokalemia of 3.3.  Otherwise normal LFTs and mildly elevated total bilirubin of 1.8.  LFTs were negative.  Lipase of 55 which is similar to prior.  Ammonia level was elevated to 152.  Troponin is mildly elevated 18.  UA showed trace leukocyte, negative nitrite and rare bacteria.  Patient was given additional lactulose in the ED and hospitalist was consulted for admission.  Review of Systems: As mentioned in the history of present illness. All other systems reviewed and are negative. Past Medical History:  Diagnosis Date   Chronic diarrhea    Cirrhosis, 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   pt stated on admission history review   Heart murmur    Hypertension    Kidney stone    Obesity    Pulmonary embolism (HCC)    Short gut syndrome    Thyroid disease    Past Surgical History:  Procedure Laterality Date   CESAREAN SECTION WITH BILATERAL TUBAL LIGATION     CHOLECYSTECTOMY     COLON SURGERY     ESOPHAGOGASTRODUODENOSCOPY (EGD) WITH PROPOFOL N/A 10/19/2022   Procedure: ESOPHAGOGASTRODUODENOSCOPY (EGD) WITH PROPOFOL;  Surgeon: Kerin Salen, MD;  Location: [facility];  Service: Gastroenterology;  Laterality: N/A;   HERNIA REPAIR     ILEOSTOMY     ILEOSTOMY CLOSURE     IR KYPHO THORACIC WITH BONE BIOPSY  08/31/2022   IR KYPHO THORACIC WITH BONE BIOPSY  10/27/2022   IR RADIOLOGIST EVAL & MGMT  09/27/2022   KIDNEY STONE  SURGERY     WRIST SURGERY     Social History:  reports that she quit smoking about 37 years ago. Her smoking use included cigarettes. She has been exposed to tobacco smoke. She has never used smokeless tobacco. She reports that she does not drink alcohol and does not use drugs.  Allergies  Allergen Reactions   Ace Inhibitors Anaphylaxis and Swelling   Ivp Dye [Iodinated Contrast Media] Anaphylaxis   Desitin [Zinc Oxide] Rash and Other (See Comments)    Worsens rash   Gadolinium Derivatives Other (See Comments)    Reaction??    Hydroxyzine Other (See Comments)    Affects the mind   Chlorhexidine Rash and Other (See Comments)    WORSENS RASHES   Doxycycline Rash   Penicillins Rash   Zofran [Ondansetron] Rash    No family history on file.  Prior to Admission medications   Medication Sig Start Date End Date Taking? Authorizing Provider  atorvastatin (LIPITOR) 40 MG tablet Take 40 mg by mouth in the morning. 01/19/22   [provider]  colchicine 0.6 MG tablet Take 0.6 mg by mouth daily as needed (gout flares).    [provider]  cyanocobalamin (VITAMIN B12) 1000 MCG tablet Take 2,000 mcg by mouth every Friday.    [provider]  dicyclomine (BENTYL) 10 MG capsule Take 1 capsule (10 mg total) by mouth 3 (three) times daily as needed for spasms (abdominal cramping). 09/09/22   Lewie Chamber, MD  EPINEPHrine 0.3 mg/0.3 mL IJ SOAJ injection Inject 0.3 mg into the muscle as needed for anaphylaxis. 08/07/19   [provider]  folic acid (FOLVITE) 1 MG tablet Take 1 tablet (1 mg total) by mouth daily. 06/16/22   Calvert Cantor, MD  furosemide (LASIX) 40 MG tablet Take 1 tablet (40 mg total) by mouth daily. Take additional 40 mg if there is a weight gain of 3 lbs in1 day or 5 lbs in 1 week. 10/31/22   Rolly Salter, MD  HYDROcodone-acetaminophen (NORCO) 10-325 MG tablet Take 1 tablet by mouth 3 (three) times daily as needed for moderate pain or severe pain. 02/25/22   [provider]  IRON-VITAMIN C PO Take 1 tablet by mouth daily with breakfast.    [provider]  lactulose (CHRONULAC) 10 GM/15ML solution Take 30 mLs (20 g total) by mouth 3 (three) times daily. Take to ensure that there is 2 loose bowel movement EVERY DAY 10/31/22   Rolly Salter, MD  levothyroxine (SYNTHROID) 150 MCG tablet Take 150 mcg by mouth daily before breakfast. 10/26/21   [provider]  lidocaine (LIDODERM) 5 % Place 1 patch onto the skin daily. Remove & Discard patch within 12 hours  or as directed by MD 10/31/22   Rolly Salter, MD  megestrol (MEGACE) 40 MG tablet Take 1 tablet (40 mg total) by mouth 2 (two) times daily. 11/30/22   Lorriane Shire, MD  methocarbamol (ROBAXIN) 500 MG tablet Take 500 mg by mouth in the morning. 03/04/22   [provider]  midodrine (PROAMATINE) 10 MG tablet Take 1 tablet (10 mg total) by mouth 3 (three) times daily with meals. 09/09/22   Lewie Chamber, MD  naloxone [facility]) nasal spray 4 mg/0.1 mL Place 1 spray into the nose as needed (accidental overdose). 03/25/22   [provider]  nystatin cream (MYCOSTATIN) Apply topically 2 (two) times daily. Patient taking differently: Apply 1 Application topically 2 (two) times daily as needed for dry skin (or  irritation). 05/22/22   Leatha Gilding, MD  nystatin powder Apply 1 Application topically 3 (three) times daily as needed (for irritation- affected areas).    [provider]  pantoprazole (PROTONIX) 40 MG tablet Take 1 tablet (40 mg total) by mouth 2 (two) times daily before a meal. 09/09/22   Lewie Chamber, MD  potassium chloride (KLOR-CON) 10 MEQ tablet Take 20 mEq by mouth in the morning and at bedtime.    [provider]  promethazine (PHENERGAN) 12.5 MG tablet Take 12.5 mg by mouth every 6 (six) hours as needed for nausea or vomiting.    [provider]  rifaximin (XIFAXAN) 550 MG TABS tablet Take 550 mg by mouth 2 (two) times daily.    [provider]  rOPINIRole (REQUIP) 0.25 MG tablet Take 1 tablet (0.25 mg total) by mouth at bedtime. 09/09/22   Lewie Chamber, MD  spironolactone (ALDACTONE) 25 MG tablet Take 1 tablet (25 mg total) by mouth daily. 09/10/22   Lewie Chamber, MD  tamsulosin (FLOMAX) 0.4 MG CAPS capsule Take 1 capsule (0.4 mg total) by mouth daily. 09/10/22   Lewie Chamber, MD  triamcinolone cream (KENALOG) 0.5 % Apply to affected area of right breast twice daily as directed. Patient taking differently: Apply 1  Application topically 2 (two) times daily as needed (for irritation- right breast). 05/10/22   Osvaldo Shipper, MD  TYLENOL 500 MG tablet Take 500 mg by mouth every 6 (six) hours as needed for mild pain or headache.    [provider]  venlafaxine XR (EFFEXOR-XR) 75 MG 24 hr capsule Take 75 mg by mouth daily with breakfast. 08/17/21   [provider]  Vitamin D, Ergocalciferol, (DRISDOL) 50000 UNITS CAPS Take 50,000 Units by mouth every Wednesday.    [provider]    Physical Exam: Vitals:   11/30/22 1706 11/30/22 2000 11/30/22 2104  BP: 124/65 124/76   Pulse: 87 79   Resp: 18 16   Temp: 98.6 F (37 C)  98.3 F (36.8 C)  TempSrc: Oral    SpO2: 100% 100%    Constitutional: NAD, calm, comfortable, chronically ill-appearing obese female lying flat in bed Eyes: Clouding of the left lens ENMT: Mucous membranes are moist.  Neck: normal, supple Respiratory: clear to auscultation bilaterally, no wheezing, no crackles. Normal respiratory effort. No accessory muscle use.  Cardiovascular: Regular rate and rhythm, no murmurs / rubs / gallops.  +3 pitting edema of bilateral lower extremity up to mid pretibial region  abdomen: Soft, nontender nondistended without ascites  musculoskeletal: no clubbing / cyanosis. No joint deformity upper and lower extremities. Normal muscle tone.  Skin: no rashes, lesions, ulcers. No induration Neurologic: CN 2-12 grossly intact.   Psychiatric: Normal judgment and insight. Alert and oriented x 3. Normal mood.   Data Reviewed:  See HPI  Assessment and Plan: * Hepatic encephalopathy (HCC) -pt has difficulty with ammonia level due to issues with chronic diarrhea. She is on Xifaxan daily and can only take Lactulose as needed because it causes her to have excessive diarrhea and dehydration.  -has already been given additional Lactulose in ED. Will just continue Xifaxan for now and follow clinically.   Morbid obesity (HCC) -Noted    Hypotension -chronic. Continue midodrine  Hypothyroidism -had mildly elevated TSH a month ago. Recommends repeat outpatient -continue levothyoxine  GERD (gastroesophageal reflux disease) Continue Pepcid and PPI  Iron deficiency anemia - Hemoglobin stable near baseline  Cirrhosis due to NASH - Compensated without ascites although  is having some increased lower extremity edema - Continue home Lasix twice daily, spironolactone  Vaginal bleeding - Patient developed vaginal bleeding during her hospitalization for ESBL UTI and bacteremia in September.  She has been on Megace 40mg  BID.  -She had follow-up with GYN on 10/30/2022 and still awaiting appointment to undergo endometrial biopsy and Pap smear and to continue Megace until further evaluation.No recent vaginal bleeding.   Hypokalemia -replete with oral potassium -continue home BID potassium       Advance Care Planning:   Code Status: Limited: Do not attempt resuscitation (DNR) -DNR-LIMITED -Do Not Intubate/DNI    Consults: none  Family Communication: son over the phone  Severity of Illness: The appropriate patient status for this patient is OBSERVATION. Observation status is judged to be reasonable and necessary in order to provide the required intensity of service to ensure the patient's safety. The patient's presenting symptoms, physical exam findings, and initial radiographic and laboratory data in the context of their medical condition is felt to place them at decreased risk for further clinical deterioration. Furthermore, it is anticipated that the patient will be medically stable for discharge from the hospital within 2 midnights of admission.   Author: Anselm Jungling, DO 11/30/2022 10:51 PM  For on call review [email or web address].

## 2022-11-30 NOTE — Assessment & Plan Note (Addendum)
11-30-2022 Continue Pepcid and PPI 12-03-2022 Continue Pepcid and PPI

## 2022-11-30 NOTE — Assessment & Plan Note (Addendum)
11-30-2022 Compensated without ascites although is having some increased lower extremity edema- Continue home Lasix twice daily, spironolactone  12-01-2022 chronic.  12-04-2022 will reduce dose of lasix to 20 mg bid since her diarrhea is going to be more frequent with lactulose bid.

## 2022-11-30 NOTE — Assessment & Plan Note (Addendum)
12-01-2022-chronic. Continue midodrine 12-02-2022 BP stable. On midodrine. 12-03-2022 stable.

## 2022-11-30 NOTE — Assessment & Plan Note (Signed)
11-30-2022- Hemoglobin stable near baseline

## 2022-11-30 NOTE — Assessment & Plan Note (Signed)
11-30-2022 had mildly elevated TSH a month ago. Recommend repeat outpatient -continue levothyoxine

## 2022-11-30 NOTE — Assessment & Plan Note (Signed)
11-30-2022 replete with oral potassium ,  -continue home BID potassium  12-01-2022 resolved.

## 2022-11-30 NOTE — Assessment & Plan Note (Signed)
11-30-2022 chronic  12-01-2022 BMI 40.7

## 2022-11-30 NOTE — Assessment & Plan Note (Addendum)
11-30-2022 pt has difficulty with ammonia level due to issues with chronic diarrhea. She is on Xifaxan daily and can only take Lactulose as needed because it causes her to have  excessive diarrhea and dehydration.  -has already been given additional Lactulose in ED. Will just continue Xifaxan for now and follow clinically.   12-01-2022 improved mentation and NH3. Will give 1 more dose of lactulose today. Repeat NH3 level in AM. If pt remains stable, probably DC in AM. Discussed with pt's [name] who is agreeable.  12-02-2022 NH3 was to 136 today. Will give more lactulose today. Repeat NH3 in AM.  12-03-2022 NH3 down to 85. Pt's [name] states pt's mentation normal when her NH3 around 60s. Will give another dose of lactulose today. Told son that pt needs to take lactulose at least once a day. Could split dose so she gets 10 g in AM and 10 g in the PM.  12-04-2022 NH3 still 91 today and pt still confused. Discussed with son that pt will need to be on 20 mg bid. Continue to monitor her mentation. Give her short gut syndrome and hepatic encephalopathy, may need to accept some degree of encephalopathy in order not to dehydrate her with too much diarrhea.

## 2022-12-01 ENCOUNTER — Encounter (HOSPITAL_COMMUNITY): Payer: Self-pay | Admitting: Family Medicine

## 2022-12-01 DIAGNOSIS — Z86718 Personal history of other venous thrombosis and embolism: Secondary | ICD-10-CM | POA: Insufficient documentation

## 2022-12-01 DIAGNOSIS — K90829 Short bowel syndrome, unspecified: Secondary | ICD-10-CM

## 2022-12-01 DIAGNOSIS — K7460 Unspecified cirrhosis of liver: Secondary | ICD-10-CM | POA: Diagnosis not present

## 2022-12-01 DIAGNOSIS — Z8619 Personal history of other infectious and parasitic diseases: Secondary | ICD-10-CM | POA: Insufficient documentation

## 2022-12-01 DIAGNOSIS — I9589 Other hypotension: Secondary | ICD-10-CM | POA: Diagnosis not present

## 2022-12-01 DIAGNOSIS — E876 Hypokalemia: Secondary | ICD-10-CM | POA: Diagnosis not present

## 2022-12-01 DIAGNOSIS — H1789 Other corneal scars and opacities: Secondary | ICD-10-CM

## 2022-12-01 DIAGNOSIS — K7682 Hepatic encephalopathy: Secondary | ICD-10-CM | POA: Diagnosis not present

## 2022-12-01 DIAGNOSIS — Z66 Do not resuscitate: Secondary | ICD-10-CM

## 2022-12-01 HISTORY — DX: Other corneal scars and opacities: H17.89

## 2022-12-01 HISTORY — DX: Personal history of other infectious and parasitic diseases: Z86.19

## 2022-12-01 LAB — CBC
HCT: 29.2 % — ABNORMAL LOW (ref 36.0–46.0)
Hemoglobin: 9.2 g/dL — ABNORMAL LOW (ref 12.0–15.0)
MCH: 29.4 pg (ref 26.0–34.0)
MCHC: 31.5 g/dL (ref 30.0–36.0)
MCV: 93.3 fL (ref 80.0–100.0)
Platelets: 135 10*3/uL — ABNORMAL LOW (ref 150–400)
RBC: 3.13 MIL/uL — ABNORMAL LOW (ref 3.87–5.11)
RDW: 17.1 % — ABNORMAL HIGH (ref 11.5–15.5)
WBC: 5.3 10*3/uL (ref 4.0–10.5)
nRBC: 0 % (ref 0.0–0.2)

## 2022-12-01 LAB — BASIC METABOLIC PANEL
Anion gap: 7 (ref 5–15)
BUN: 20 mg/dL (ref 8–23)
CO2: 21 mmol/L — ABNORMAL LOW (ref 22–32)
Calcium: 8.3 mg/dL — ABNORMAL LOW (ref 8.9–10.3)
Chloride: 110 mmol/L (ref 98–111)
Creatinine, Ser: 1.1 mg/dL — ABNORMAL HIGH (ref 0.44–1.00)
GFR, Estimated: 56 mL/min — ABNORMAL LOW (ref >60)
Glucose, Bld: 89 mg/dL (ref 70–99)
Potassium: 3.6 mmol/L (ref 3.5–5.1)
Sodium: 138 mmol/L (ref 135–145)

## 2022-12-01 LAB — AMMONIA: Ammonia: 92 umol/L — ABNORMAL HIGH (ref 9–35)

## 2022-12-01 MED ORDER — LACTULOSE 10 GM/15ML PO SOLN
20.0000 g | Freq: Once | ORAL | Status: DC
  Filled 2022-12-01: qty 30

## 2022-12-01 MED ORDER — HYDROCORTISONE 1 % EX CREA
1.0000 | TOPICAL_CREAM | Freq: Three times a day (TID) | CUTANEOUS | Status: DC | PRN
  Administered 2022-12-01 – 2022-12-02 (×3): 1 via TOPICAL
  Filled 2022-12-01 (×2): qty 28

## 2022-12-01 NOTE — Progress Notes (Signed)
PROGRESS NOTE    Lynnette Castles  ZOX:[id number] DOB: June 27, 1955 DOA: 11/30/2022 PCP: [name], MD  Subjective: Pt seen and examined. Pt well known to me from prior hospitalization. Met with pt and her son(john) at bedside.  Pt with known short-gut syndrome and has chronic diarrhea. Son limits pt's intake of lactulose due to known chronic diarrhea and only gives lactulose when pt starts acting "funny". Pt became constipation a few days ago and didn't have a BM for 2 days. Son tried to administer lactulose but pt's mentation was already declining.  Had BM yesterday with lactulose. Admission NH3 was 152. This AM NH3 was 92.  Pt is already feeling better. Has eaten breakfast well.  Pt able to understand jokes/sarcasm well. I asked her to wink at me with her "good" eye. 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  Pt smiled and winked at me with her leukoma eye(left).  If she didn't understand the joke of "good" eye, she would have winked at me with her right eye.   Hospital Course: HPI: Zynia Borgman is a 67 y.o. female with medical history significant of alcohol fatty liver cirrhosis, chronic diarrhea, hypertension, obesity, PE/DVT, short gut syndrome, hypothyroidism who presents with increasing confusion.   History obtained documentation and son over the phone. Pt is a limited historian.    Son brought her in today because she was more confused and had less bowel movement. Son gives her Xifaxan daily and only gives her Lactulose as needed at the instruction of GI because she has chronic diarrhea. If she took Lactulose daily she would have at least 6 bowel movements and get dehydrated.  Son has a special word he ask her and if she can't give that word or if she talks slow those would be signs he looks for to give her lactulose.  Last week she had lab work showing elevated ammonia in the 100 and had less bowel movement so son did give her a few  doses of Lactulose. Today prior to coming to ED he gave her a dose of Lactulose and reports already 6 small bowel movements. She then had a large one in ED right after receiving additional Lactulose here.    Of note, Patient recently hospitalized from 10/7 to 10/21 with hepatic encephalopathy due to missing her lactulose.  She also underwent kyphoplasty on 10/17 due to acute on chronic back pain T12 compression fracture.   Prior to that she was hospitalized from 09/30/2022 to 10/12/2022 for sepsis secondary to Pseudomonas UTI with bacteremia and completed a course of meropenem.     On arrival to the ED, she was afebrile and normotensive on room air.   CBC without leukocytosis, hemoglobin at of 9.5 with a baseline of 8.  Platelets within normal limits.   CMP with mild hypokalemia of 3.3.  Otherwise normal LFTs and mildly elevated total bilirubin of 1.8.  LFTs were negative.  Lipase of 55 which is similar to prior.   Ammonia level was elevated to 152.   Troponin is mildly elevated 18.   UA showed trace leukocyte, negative nitrite and rare bacteria.   Patient was given additional lactulose in the ED and hospitalist was consulted for admission.    Significant Events: Admitted 11/30/2022 for hepatic encephalopathy   Significant Labs: Admission NH3 152, WBC 5.8, HgB 9.5, K 3.3  Significant Imaging Studies:   Antibiotic Therapy: Anti-infectives (From admission, onward)    Start  Dose/Rate Route Frequency Ordered Stop   12/01/22 1000  rifaximin (XIFAXAN) tablet 550 mg        550 mg Oral 2 times daily 11/30/22 2244         Procedures:   Consultants:     Assessment and Plan: * Hepatic encephalopathy (HCC) 11-30-2022 pt has difficulty with ammonia level due to issues with chronic diarrhea. She is on Xifaxan daily and can only take Lactulose as needed because it causes her to have  excessive diarrhea and dehydration.  -has already been given additional Lactulose in ED. Will  just continue Xifaxan for now and follow clinically.   12-01-2022 improved mentation and NH3. Will give 1 more dose of lactulose today. Repeat NH3 level in AM. If pt remains stable, probably DC in AM. Discussed with pt's [name] who is agreeable.  Vaginal bleeding 11-30-2022  Patient developed vaginal bleeding during her hospitalization for ESBL UTI and bacteremia in September.  She has been on Megace 40mg  BID.  -She had follow-up with GYN on 10/30/2022 and still awaiting appointment to undergo endometrial biopsy and Pap smear and to continue Megace until further evaluation.No recent vaginal bleeding.    Hypokalemia 11-30-2022 replete with oral potassium ,  -continue home BID potassium  12-01-2022 resolved.  Morbid obesity (HCC) 11-30-2022 chronic  12-01-2022 BMI 40.7  Short gut syndrome 12-01-2022 chronic. Pt with chronic diarrhea. This limits her use of lactulose for her hepatic encephalopathy.  Hypotension 12-01-2022-chronic. Continue midodrine  Hypothyroidism 11-30-2022 had mildly elevated TSH a month ago. Recommend repeat outpatient -continue levothyoxine  GERD (gastroesophageal reflux disease) 11-30-2022 Continue Pepcid and PPI  Iron deficiency anemia 11-30-2022- Hemoglobin stable near baseline  Cirrhosis due to NASH 11-30-2022 Compensated without ascites although is having some increased lower extremity edema- Continue home Lasix twice daily, spironolactone  12-01-2022 chronic.  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 Consistent with prior hospitalizations.   DVT prophylaxis: enoxaparin (LOVENOX) injection 40 mg Start: 11/30/22 2230     Code Status: Limited: Do not attempt resuscitation (DNR) -DNR-LIMITED -Do Not Intubate/DNI  Family Communication: discussed with pt and son john at bedside Disposition Plan:  return home Reason for continuing need for hospitalization: monitoring NH3 level and hepatic encephalopathy  Objective: Vitals:   11/30/22 2325 12/01/22 0004 12/01/22 0342 12/01/22 0732  BP: (!) 106/57 120/63 124/67 117/65  Pulse: 81 82 79 86  Resp: 17  20 18   Temp: 98.4 F (36.9 C)  98.8 F (37.1 C) 98.4 F (36.9 C)  TempSrc: Oral  Oral Oral  SpO2: 98%  98% 97%    Intake/Output Summary (Last 24 hours) at 12/01/2022 1253 Last data filed at 12/01/2022 0936 Gross per 24 hour  Intake 50 ml  Output 750 ml  Net -700 ml   There were no vitals filed for this visit.  Examination:  Physical Exam Vitals and nursing note reviewed.  Constitutional:      General: She is not in acute distress.    Appearance: She is obese. She is not toxic-appearing.  HENT:     Head: Normocephalic and atraumatic.  Eyes:     General: No scleral icterus.    Comments: Leukoma of left eye  Cardiovascular:     Rate and Rhythm: Normal rate and regular rhythm.     Pulses: Normal pulses.  Pulmonary:     Effort: Pulmonary effort is normal. No respiratory distress.  Abdominal:     General: Abdomen is protuberant. Bowel sounds are normal. There is no distension.     Palpations: Abdomen is soft.  Musculoskeletal:     Right lower leg: No edema.     Left lower leg: No edema.  Skin:    General: Skin is warm and dry.     Capillary Refill: Capillary refill takes less than 2 seconds.  Neurological:     Mental Status: She is alert and oriented to person, place, and time.     Data Reviewed: I have personally reviewed following labs and imaging studies  CBC: Recent Labs  Lab 11/30/22 1805 12/01/22 0551  WBC 5.8 5.3  NEUTROABS 3.1  --   HGB 9.5* 9.2*  HCT 30.9* 29.2*  MCV 95.1 93.3  PLT 168 135*   Basic Metabolic Panel: Recent Labs  Lab 11/30/22 1805 12/01/22 0551  NA 136 138  K 3.3* 3.6  CL 106 110  CO2 22 21*  GLUCOSE 165* 89  BUN 19 20  CREATININE 1.18* 1.10*  CALCIUM 8.3* 8.3*    GFR: CrCl cannot be calculated (Unknown ideal weight.). Liver Function Tests: Recent Labs  Lab 11/30/22 1805  AST 30  ALT 18  ALKPHOS 106  BILITOT 1.8*  PROT 6.3*  ALBUMIN 2.7*   Recent Labs  Lab 11/30/22 1805  LIPASE 55*   Recent Labs  Lab 11/30/22 1805 12/01/22 0551  AMMONIA 152* 92*   BNP (last 3 results) Recent Labs    08/30/22 0354  BNP 614.9*   CBG: Recent Labs  Lab 11/30/22 1809  GLUCAP 141*    Radiology Studies: No results found.  Scheduled Meds:  atorvastatin  40 mg Oral Daily   enoxaparin (LOVENOX) injection  40 mg Subcutaneous QHS   famotidine  20 mg Oral Daily   furosemide  40 mg Oral BID   lactulose  20 g Oral Once   levothyroxine  150 mcg Oral QAC breakfast   megestrol  40 mg Oral BID   methocarbamol  500 mg Oral Daily   midodrine  10 mg Oral TID WC   pantoprazole  40 mg Oral BID AC   potassium chloride  20 mEq Oral BID   rifaximin  550 mg Oral BID   rOPINIRole  0.25 mg Oral Daily   spironolactone  25 mg Oral Daily   tamsulosin  0.4 mg Oral Daily   venlafaxine XR  75 mg Oral Q breakfast   Continuous Infusions:   LOS: 0 days   Time spent: 40 minutes  Carollee Herter, DO  [facility]  12/01/2022, 12:53 PM

## 2022-12-01 NOTE — Plan of Care (Signed)

## 2022-12-01 NOTE — Assessment & Plan Note (Addendum)
12-01-2022 chronic. Pt with chronic diarrhea. This limits her use of lactulose for her hepatic encephalopathy.  12-03-2022 stable.

## 2022-12-01 NOTE — Assessment & Plan Note (Signed)
12-01-2022 chronic. Complete white-out of left cornea.

## 2022-12-01 NOTE — Plan of Care (Signed)
No acute events this shift. The patient has rested comfortably without complaints of pain. Her orientation status has improved since her admission. Due to frequent and loose BMs the lactulose was held per provider orders. VSS. Fall precautions in place. Will continue to monitor.  Problem: Education: Goal: Knowledge of General Education information will improve Description: Including pain rating scale, medication(s)/side effects and non-pharmacologic comfort measures Outcome: Progressing   Problem: Health Behavior/Discharge Planning: Goal: Ability to manage health-related needs will improve Outcome: Progressing   Problem: Clinical Measurements: Goal: Ability to maintain clinical measurements within normal limits will improve Outcome: Progressing Goal: Will remain free from infection Outcome: Progressing Goal: Diagnostic test results will improve Outcome: Progressing Goal: Respiratory complications will improve Outcome: Progressing Goal: Cardiovascular complication will be avoided Outcome: Progressing   Problem: Activity: Goal: Risk for activity intolerance will decrease Outcome: Progressing   Problem: Nutrition: Goal: Adequate nutrition will be maintained Outcome: Progressing   Problem: Coping: Goal: Level of anxiety will decrease Outcome: Progressing   Problem: Elimination: Goal: Will not experience complications related to bowel motility Outcome: Progressing Goal: Will not experience complications related to urinary retention Outcome: Progressing   Problem: Pain Management: Goal: General experience of comfort will improve Outcome: Progressing   Problem: Safety: Goal: Ability to remain free from injury will improve Outcome: Progressing   Problem: Skin Integrity: Goal: Risk for impaired skin integrity will decrease Outcome: Progressing

## 2022-12-01 NOTE — Assessment & Plan Note (Signed)
12-01-2022 pt remains DNR/DNI. Consistent with prior hospitalizations.

## 2022-12-01 NOTE — Hospital Course (Addendum)
67 y.o. female with medical history significant of alcohol fatty liver cirrhosis, chronic diarrhea, hypertension, obesity, PE/DVT, short gut syndrome, hypothyroidism who presents with increasing confusion. History obtained documentation and son over the phone. Pt is a limited historian.  Son brought her in because she was more confused and had less bowel movement. Son gives her Xifaxan daily and only gives her Lactulose as needed at the instruction of GI because she has chronic diarrhea. If she took Lactulose daily she would have at least 6 bowel movements and get dehydrated.  Son has a special word he ask her and if she can't give that word or if she talks slow those would be signs he looks for to give her lactulose.  Last week she had lab work showing elevated ammonia in the 100 and had less bowel movement so son did give her a few doses of Lactulose. Today prior to coming to ED he gave her a dose of Lactulose and reports already 6 small bowel movements. She then had a large one in ED right after receiving additional Lactulose here.  Of note, Patient recently hospitalized from 10/7 to 10/21 with hepatic encephalopathy due to missing her lactulose.  She also underwent kyphoplasty on 10/17 due to acute on chronic back pain T12 compression fracture. Prior to that she was hospitalized from 09/30/2022 to 10/12/2022 for sepsis secondary to Pseudomonas UTI with bacteremia and completed a course of meropenem. In the ED afebrile normotensive on room air. Admission NH3 152, WBC 5.8, HgB 9.5, K 3.3.  CBC: Hemoglobin of 9.5 g from baseline 8 Lipase of 55 which is similar to prior. UA showed trace leukocyte, negative nitrite and rare bacteria. Patient was given additional lactulose in the ED and hospitalist was consulted for admission for hepatic encephalopathy. Patient lactulose was continued seen and start lactulose dose to have bowel movements Mental status has improved clinically.  Patient's son at the bedside  agreeable for discharge home I have advised her to take lactulose 3 times daily and titrate down if having frequent BM more than 4. Advised to follow-up with PCP

## 2022-12-01 NOTE — Subjective & Objective (Addendum)
Pt seen and examined. Pt's mentation is better but son [name] says pt is still a little confused. Needed help feeding herself which she normally does not need.  NH3 down to 85 today.

## 2022-12-01 NOTE — Assessment & Plan Note (Signed)
12-01-2022 remains in contact isolation

## 2022-12-02 DIAGNOSIS — K219 Gastro-esophageal reflux disease without esophagitis: Secondary | ICD-10-CM | POA: Diagnosis not present

## 2022-12-02 DIAGNOSIS — K7682 Hepatic encephalopathy: Secondary | ICD-10-CM | POA: Diagnosis not present

## 2022-12-02 DIAGNOSIS — K7460 Unspecified cirrhosis of liver: Secondary | ICD-10-CM | POA: Diagnosis not present

## 2022-12-02 LAB — COMPREHENSIVE METABOLIC PANEL
ALT: 15 U/L (ref 0–44)
AST: 25 U/L (ref 15–41)
Albumin: 2.1 g/dL — ABNORMAL LOW (ref 3.5–5.0)
Alkaline Phosphatase: 87 U/L (ref 38–126)
Anion gap: 6 (ref 5–15)
BUN: 19 mg/dL (ref 8–23)
CO2: 22 mmol/L (ref 22–32)
Calcium: 8.2 mg/dL — ABNORMAL LOW (ref 8.9–10.3)
Chloride: 112 mmol/L — ABNORMAL HIGH (ref 98–111)
Creatinine, Ser: 1.04 mg/dL — ABNORMAL HIGH (ref 0.44–1.00)
GFR, Estimated: 60 mL/min — ABNORMAL LOW (ref >60)
Glucose, Bld: 96 mg/dL (ref 70–99)
Potassium: 3.9 mmol/L (ref 3.5–5.1)
Sodium: 140 mmol/L (ref 135–145)
Total Bilirubin: 1.7 mg/dL — ABNORMAL HIGH (ref <1.2)
Total Protein: 4.7 g/dL — ABNORMAL LOW (ref 6.5–8.1)

## 2022-12-02 LAB — AMMONIA: Ammonia: 136 umol/L — ABNORMAL HIGH (ref 9–35)

## 2022-12-02 LAB — MAGNESIUM: Magnesium: 1.8 mg/dL (ref 1.7–2.4)

## 2022-12-02 MED ORDER — LACTULOSE 10 GM/15ML PO SOLN
20.0000 g | Freq: Once | ORAL | Status: AC
  Administered 2022-12-02: 20 g via ORAL
  Filled 2022-12-02: qty 30

## 2022-12-02 MED ORDER — LORATADINE 10 MG PO TABS: 10.0000 mg | ORAL_TABLET | Freq: Every day | ORAL | Status: DC

## 2022-12-02 MED ORDER — LORATADINE 10 MG PO TABS
10.0000 mg | ORAL_TABLET | Freq: Once | ORAL | Status: AC
  Administered 2022-12-02: 10 mg via ORAL
  Filled 2022-12-02: qty 1

## 2022-12-02 NOTE — Progress Notes (Signed)
PROGRESS NOTE    Sloan Snawder  OZH:[id number] DOB: 1955-10-01 DOA: 11/30/2022 PCP: [name], MD  Subjective: Pt seen and examined. Appears encephalopathic today. Pt states she is not feeling well. She knows that she is confused.  NH3 up to 136 today. Was 72 yesterday. I called pt's [name]. He says pt did not have a BM last night but did have 3 bm during the day.  Plans for DC today are canceled. Son is agreeable to this plan.   Hospital Course: HPI: Dayra Wroblewski is a 67 y.o. female with medical history significant of alcohol fatty liver cirrhosis, chronic diarrhea, hypertension, obesity, PE/DVT, short gut syndrome, hypothyroidism who presents with increasing confusion.   History obtained documentation and son over the phone. Pt is a limited historian.    Son brought her in today because she was more confused and had less bowel movement. Son gives her Xifaxan daily and only gives her Lactulose as needed at the instruction of GI because she has chronic diarrhea. If she took Lactulose daily she would have at least 6 bowel movements and get dehydrated.  Son has a special word he ask her and if she can't give that word or if she talks slow those would be signs he looks for to give her lactulose.  Last week she had lab work showing elevated ammonia in the 100 and had less bowel movement so son did give her a few doses of Lactulose. Today prior to coming to ED he gave her a dose of Lactulose and reports already 6 small bowel movements. She then had a large one in ED right after receiving additional Lactulose here.    Of note, Patient recently hospitalized from 10/7 to 10/21 with hepatic encephalopathy due to missing her lactulose.  She also underwent kyphoplasty on 10/17 due to acute on chronic back pain T12 compression fracture.   Prior to that she was hospitalized from 09/30/2022 to 10/12/2022 for sepsis secondary to Pseudomonas UTI with bacteremia and completed a course of  meropenem.     On arrival to the ED, she was afebrile and normotensive on room air.   CBC without leukocytosis, hemoglobin at of 9.5 with a baseline of 8.  Platelets within normal limits.   CMP with mild hypokalemia of 3.3.  Otherwise normal LFTs and mildly elevated total bilirubin of 1.8.  LFTs were negative.  Lipase of 55 which is similar to prior.   Ammonia level was elevated to 152.   Troponin is mildly elevated 18.   UA showed trace leukocyte, negative nitrite and rare bacteria.   Patient was given additional lactulose in the ED and hospitalist was consulted for admission.    Significant Events: Admitted 11/30/2022 for hepatic encephalopathy   Significant Labs: Admission NH3 152, WBC 5.8, HgB 9.5, K 3.3  Significant Imaging Studies:   Antibiotic Therapy: Anti-infectives (From admission, onward)    Start     Dose/Rate Route Frequency Ordered Stop   12/01/22 1000  rifaximin (XIFAXAN) tablet 550 mg        550 mg Oral 2 times daily 11/30/22 2244         Procedures:   Consultants:     Assessment and Plan: * Hepatic encephalopathy (HCC) 11-30-2022 pt has difficulty with ammonia level due to issues with chronic diarrhea. She is on Xifaxan daily and can only take Lactulose as needed because it causes her to have  excessive diarrhea and dehydration.  -has already been given additional Lactulose in ED. Will  just continue Xifaxan for now and follow clinically.   12-01-2022 improved mentation and NH3. Will give 1 more dose of lactulose today. Repeat NH3 level in AM. If pt remains stable, probably DC in AM. Discussed with pt's [name] who is agreeable.  12-02-2022 NH3 was to 136 today. Will give more lactulose today. Repeat NH3 in AM.  Vaginal bleeding 11-30-2022  Patient developed vaginal bleeding during her hospitalization for ESBL UTI and bacteremia in September.  She has been on Megace 40mg  BID.  -She had follow-up with GYN on 10/30/2022 and still awaiting  appointment to undergo endometrial biopsy and Pap smear and to continue Megace until further evaluation.No recent vaginal bleeding.    Hypokalemia 11-30-2022 replete with oral potassium ,  -continue home BID potassium  12-01-2022 resolved.  Morbid obesity (HCC) 11-30-2022 chronic  12-01-2022 BMI 40.7  Short gut syndrome 12-01-2022 chronic. Pt with chronic diarrhea. This limits her use of lactulose for her hepatic encephalopathy.  Hypotension 12-01-2022-chronic. Continue midodrine 12-02-2022 BP stable. On midodrine.  Hypothyroidism 11-30-2022 had mildly elevated TSH a month ago. Recommend repeat outpatient -continue levothyoxine  GERD (gastroesophageal reflux disease) 11-30-2022 Continue Pepcid and PPI  Iron deficiency anemia 11-30-2022- Hemoglobin stable near baseline  Cirrhosis due to NASH 11-30-2022 Compensated without ascites although is having some increased lower extremity edema- Continue home Lasix twice daily, spironolactone  12-01-2022 chronic.  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 Consistent with prior hospitalizations.       DVT prophylaxis: enoxaparin (LOVENOX) injection 40 mg Start: 11/30/22 2230     Code Status: Limited: Do not attempt resuscitation (DNR) -DNR-LIMITED -Do Not Intubate/DNI  Family Communication: called pt's [name] on phone. Disposition Plan: return home Reason for continuing need for hospitalization: needs more lactulose. Ongoing hepatic encephalopathy  Objective: Vitals:   12/01/22 1317 12/01/22 2003 12/02/22 0439 12/02/22 1150  BP: (!) 145/73 118/64 109/60 118/69  Pulse: 79 74 84 88  Resp: 17 15 16 18   Temp: 99.1 F (37.3 C) 98.2 F (36.8 C) 98.6 F (37 C) 98 F (36.7 C)  TempSrc:  Oral  Oral Oral  SpO2: 100% 91% 97% 97%    Intake/Output Summary (Last 24 hours) at 12/02/2022 1302 Last data filed at 12/02/2022 0900 Gross per 24 hour  Intake 660 ml  Output 950 ml  Net -290 ml   There were no vitals filed for this visit.  Examination:  Physical Exam Vitals and nursing note reviewed.  Constitutional:      Comments: Awake but appears confused  HENT:     Head: Normocephalic and atraumatic.  Eyes:     Comments: Left eye leukoma  Cardiovascular:     Rate and Rhythm: Normal rate and regular rhythm.  Pulmonary:     Effort: Pulmonary effort is normal.     Breath sounds: Normal breath sounds.  Abdominal:     General: Abdomen is protuberant. Bowel sounds are normal. There is no distension.     Palpations: Abdomen is soft.  Skin:    General: Skin is warm and dry.     Capillary Refill: Capillary refill takes less than 2 seconds.  Neurological:     Mental Status: She is disoriented.     Data Reviewed: I have personally reviewed following labs and imaging studies  CBC: Recent Labs  Lab 11/30/22 1805  12/01/22 0551  WBC 5.8 5.3  NEUTROABS 3.1  --   HGB 9.5* 9.2*  HCT 30.9* 29.2*  MCV 95.1 93.3  PLT 168 135*   Basic Metabolic Panel: Recent Labs  Lab 11/30/22 1805 12/01/22 0551 12/02/22 0600  NA 136 138 140  K 3.3* 3.6 3.9  CL 106 110 112*  CO2 22 21* 22  GLUCOSE 165* 89 96  BUN 19 20 19   CREATININE 1.18* 1.10* 1.04*  CALCIUM 8.3* 8.3* 8.2*  MG  --   --  1.8   GFR: CrCl cannot be calculated (Unknown ideal weight.). Liver Function Tests: Recent Labs  Lab 11/30/22 1805 12/02/22 0600  AST 30 25  ALT 18 15  ALKPHOS 106 87  BILITOT 1.8* 1.7*  PROT 6.3* 4.7*  ALBUMIN 2.7* 2.1*   Recent Labs  Lab 11/30/22 1805  LIPASE 55*   Recent Labs  Lab 11/30/22 1805 12/01/22 0551 12/02/22 0600  AMMONIA 152* 92* 136*   BNP (last 3 results) Recent Labs    08/30/22 0354  BNP 614.9*   CBG: Recent Labs  Lab 11/30/22 1809  GLUCAP 141*     Radiology Studies: No results found.  Scheduled Meds:  atorvastatin  40 mg Oral Daily   enoxaparin (LOVENOX) injection  40 mg Subcutaneous QHS   famotidine  20 mg Oral Daily   furosemide  40 mg Oral BID   lactulose  20 g Oral Once   levothyroxine  150 mcg Oral QAC breakfast   megestrol  40 mg Oral BID   methocarbamol  500 mg Oral Daily   midodrine  10 mg Oral TID WC   pantoprazole  40 mg Oral BID AC   potassium chloride  20 mEq Oral BID   rifaximin  550 mg Oral BID   rOPINIRole  0.25 mg Oral Daily   spironolactone  25 mg Oral Daily   tamsulosin  0.4 mg Oral Daily   venlafaxine XR  75 mg Oral Q breakfast   Continuous Infusions:   LOS: 0 days   Time spent: 40 minutes  Carollee Herter, DO  [facility]  12/02/2022, 1:02 PM

## 2022-12-02 NOTE — TOC Initial Note (Signed)
Transition of Care [facility]) - Initial/Assessment Note    Patient Details  Name: [name] MRN: [id number] Date of Birth: 18-Mar-1955  Transition of [facility]) CM/SW Contact:    Otelia Santee, LCSW Phone Number: 12/02/2022, 9:51 AM  Clinical Narrative:                 Pt from home w/ son. Pt currently active with [facility] for [facility]. Pt's son shares pt typically receives HHPT however, on last admit HH order had OT/RN as well. Pt's son reports having no needs for RN/OT and request that [facility] order be placed for PT only at discharge.  Pt has no DME needs and has the following DME at home: hospital bed, [facility], Wheelchair, Rollator, Hoyer lift, shower chair  Pt's son to transport pt home at discharge.   Expected Discharge Plan: Home w Home Health Services Barriers to Discharge: No Barriers Identified   Patient Goals and CMS Choice Patient states their goals for this hospitalization and ongoing recovery are:: For pt to return home CMS Medicare.gov Compare Post Acute Care list provided to:: Patient Represenative (must comment) Choice offered to / list presented to : [facility] POA / Guardian, Adult Children [place] ownership interest in [facility].provided to::  (NA)    Expected Discharge Plan and Services In-house Referral: NA Discharge Planning Services: NA Post Acute Care Choice: Home Health, Resumption of Svcs/PTA Provider Living arrangements for the past 2 months: Single Family Home                 DME Arranged: N/A DME Agency: NA                  Prior Living Arrangements/Services Living arrangements for the past 2 months: Single Family Home Lives with:: Adult Children Patient language and need for interpreter reviewed:: Yes Do you feel safe going back to the place where you live?: Yes      Need for Family Participation in Patient Care: Yes (Comment) Care giver support system in place?: Yes (comment) Current home services: DME, Home PT (DME: hospital bed, BSC,  Wheelchair, Rollator, [employer], shower chair . HH PT through [facility]) Criminal Activity/Legal Involvement Pertinent to Current Situation/Hospitalization: No - Comment as needed  Activities of Daily Living   ADL Screening (condition at time of admission) Independently performs ADLs?: No Does the patient have a NEW difficulty with bathing/dressing/toileting/self-feeding that is expected to last >3 days?: No Does the patient have a NEW difficulty with getting in/out of bed, walking, or climbing stairs that is expected to last >3 days?: No Does the patient have a NEW difficulty with communication that is expected to last >3 days?: No Is the patient deaf or have difficulty hearing?: No Does the patient have difficulty seeing, even when wearing glasses/contacts?: No Does the patient have difficulty concentrating, remembering, or making decisions?: Yes  Permission Sought/Granted Permission sought to share information with : Facility Medical sales representative, Family Supports Permission granted to share information with : Yes, Verbal Permission Granted  Share Information with NAME: Klarissa Siefer  Permission granted to share info w AGENCY: [facility]  Permission granted to share info w Relationship: Son  Permission granted to share info w Contact Information: [phone]  Emotional Assessment Appearance:: Appears older than stated age     Orientation: : Oriented to Self, Oriented to Place, Oriented to  Time, Oriented to Situation Alcohol / Substance Use: Not Applicable Psych Involvement: No (comment)  Admission diagnosis:  Hepatic encephalopathy (HCC) [K76.82] Patient  Active Problem List   Diagnosis Date Noted   History of DVT (deep vein thrombosis) 12/01/2022   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   Hepatic encephalopathy (HCC) 11/30/2022   DNR (do not resuscitate)/DNI(Do Not intubate)  10/05/2022   Vaginal bleeding 10/05/2022   Physical deconditioning 08/17/2022   Decompensated hepatic cirrhosis (HCC) 08/17/2022   Hypotension 08/11/2022   Folate deficiency 06/14/2022   Systolic murmur 06/12/2022   Iron deficiency anemia 05/16/2022   Thrombocytopenia (HCC) 05/16/2022   History of eye surgery 05/01/2022   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   Corneal scarring 09/10/2020   Cerebral atherosclerosis 08/20/2020   Short gut syndrome 08/20/2020   Abdominal wall hernia 03/06/2020   Splenomegaly 02/06/2019   Vitamin B12 deficiency 01/21/2019   Depression, major, recurrent, moderate (HCC) 09/10/2018   Hyperuricemia 08/25/2016   GERD (gastroesophageal reflux disease) 07/25/2016   Hypothyroidism 07/25/2016   Morbid obesity (HCC) 07/25/2014   Chronic diarrhea 06/01/2014   Hypokalemia 06/01/2014   PCP:  [name], MD Pharmacy:   [id number] - [place], [place] - [address] [place] [address] Phone: [phone] Fax: [phone]  [id number] [place] - [address] AT [facility] [address] [place] [address] Phone: [phone] Fax: [phone]     Social Determinants of Health (SDOH) Social History: SDOH Screenings   Food Insecurity: No Food Insecurity (11/30/2022)  Housing: Low Risk  (11/30/2022)  Transportation Needs: No Transportation Needs (11/30/2022)  Utilities: Not At Risk (11/30/2022)  Depression (PHQ2-9): Medium Risk (11/03/2022)  Tobacco Use: Medium Risk (12/01/2022)   SDOH Interventions:     Readmission Risk Interventions    10/19/2022   10:12 AM 08/15/2022    3:20 PM 05/18/2022   10:24 AM  Readmission Risk Prevention Plan  Transportation Screening Complete Complete Complete  PCP or Specialist Appt within 5-7 Days   Complete  Home Care Screening   Complete  Medication Review (RN CM)   Complete  Medication Review (RN Care Manager) Complete Complete   PCP or Specialist appointment  within 3-5 days of discharge Complete Complete   HRI or Home Care Consult Complete Complete   SW Recovery Care/Counseling Consult Complete Complete   Palliative Care Screening Not Applicable Not Applicable   Skilled Nursing Facility Not Applicable Not Applicable

## 2022-12-02 NOTE — Plan of Care (Signed)
The patient was slightly more confused this morning, and she received a dose of lactulose this afternoon. She has since returned to her baseline mentation. VSS. Fall precautions in place. Will continue to monitor.  Problem: Education: Goal: Knowledge of General Education information will improve Description: Including pain rating scale, medication(s)/side effects and non-pharmacologic comfort measures Outcome: Progressing   Problem: Health Behavior/Discharge Planning: Goal: Ability to manage health-related needs will improve Outcome: Progressing   Problem: Clinical Measurements: Goal: Ability to maintain clinical measurements within normal limits will improve Outcome: Progressing Goal: Will remain free from infection Outcome: Progressing Goal: Diagnostic test results will improve Outcome: Progressing Goal: Respiratory complications will improve Outcome: Progressing Goal: Cardiovascular complication will be avoided Outcome: Progressing   Problem: Activity: Goal: Risk for activity intolerance will decrease Outcome: Progressing   Problem: Nutrition: Goal: Adequate nutrition will be maintained Outcome: Progressing   Problem: Coping: Goal: Level of anxiety will decrease Outcome: Progressing   Problem: Elimination: Goal: Will not experience complications related to bowel motility Outcome: Progressing Goal: Will not experience complications related to urinary retention Outcome: Progressing   Problem: Pain Management: Goal: General experience of comfort will improve Outcome: Progressing   Problem: Safety: Goal: Ability to remain free from injury will improve Outcome: Progressing   Problem: Skin Integrity: Goal: Risk for impaired skin integrity will decrease Outcome: Progressing

## 2022-12-02 NOTE — Care Management Obs Status (Signed)
MEDICARE OBSERVATION STATUS NOTIFICATION   Patient Details  Name: [name] MRN: [id number] Date of Birth: Jul 19, 1955   Medicare Observation Status Notification Given:  Yes    Otelia Santee, LCSW 12/02/2022, 9:49 AM

## 2022-12-02 NOTE — Plan of Care (Signed)
  Problem: Education: Goal: Knowledge of General Education information will improve Description Including pain rating scale, medication(s)/side effects and non-pharmacologic comfort measures Outcome: Progressing   

## 2022-12-03 DIAGNOSIS — G8929 Other chronic pain: Secondary | ICD-10-CM | POA: Diagnosis present

## 2022-12-03 DIAGNOSIS — Z87891 Personal history of nicotine dependence: Secondary | ICD-10-CM | POA: Diagnosis not present

## 2022-12-03 DIAGNOSIS — K219 Gastro-esophageal reflux disease without esophagitis: Secondary | ICD-10-CM | POA: Diagnosis present

## 2022-12-03 DIAGNOSIS — Z86711 Personal history of pulmonary embolism: Secondary | ICD-10-CM | POA: Diagnosis not present

## 2022-12-03 DIAGNOSIS — K7682 Hepatic encephalopathy: Secondary | ICD-10-CM | POA: Diagnosis present

## 2022-12-03 DIAGNOSIS — M549 Dorsalgia, unspecified: Secondary | ICD-10-CM | POA: Diagnosis present

## 2022-12-03 DIAGNOSIS — N939 Abnormal uterine and vaginal bleeding, unspecified: Secondary | ICD-10-CM | POA: Diagnosis present

## 2022-12-03 DIAGNOSIS — K90829 Short bowel syndrome, unspecified: Secondary | ICD-10-CM | POA: Diagnosis present

## 2022-12-03 DIAGNOSIS — H179 Unspecified corneal scar and opacity: Secondary | ICD-10-CM | POA: Diagnosis present

## 2022-12-03 DIAGNOSIS — I959 Hypotension, unspecified: Secondary | ICD-10-CM | POA: Diagnosis present

## 2022-12-03 DIAGNOSIS — E039 Hypothyroidism, unspecified: Secondary | ICD-10-CM | POA: Diagnosis present

## 2022-12-03 DIAGNOSIS — K529 Noninfective gastroenteritis and colitis, unspecified: Secondary | ICD-10-CM | POA: Diagnosis present

## 2022-12-03 DIAGNOSIS — D509 Iron deficiency anemia, unspecified: Secondary | ICD-10-CM | POA: Diagnosis present

## 2022-12-03 DIAGNOSIS — Z66 Do not resuscitate: Secondary | ICD-10-CM | POA: Diagnosis present

## 2022-12-03 DIAGNOSIS — K7460 Unspecified cirrhosis of liver: Secondary | ICD-10-CM | POA: Diagnosis present

## 2022-12-03 DIAGNOSIS — E860 Dehydration: Secondary | ICD-10-CM | POA: Diagnosis present

## 2022-12-03 DIAGNOSIS — I10 Essential (primary) hypertension: Secondary | ICD-10-CM | POA: Diagnosis present

## 2022-12-03 DIAGNOSIS — E876 Hypokalemia: Secondary | ICD-10-CM | POA: Diagnosis present

## 2022-12-03 DIAGNOSIS — Z880 Allergy status to penicillin: Secondary | ICD-10-CM | POA: Diagnosis not present

## 2022-12-03 DIAGNOSIS — Z79899 Other long term (current) drug therapy: Secondary | ICD-10-CM | POA: Diagnosis not present

## 2022-12-03 DIAGNOSIS — Z79890 Hormone replacement therapy (postmenopausal): Secondary | ICD-10-CM | POA: Diagnosis not present

## 2022-12-03 DIAGNOSIS — K7581 Nonalcoholic steatohepatitis (NASH): Secondary | ICD-10-CM | POA: Diagnosis present

## 2022-12-03 DIAGNOSIS — Z6841 Body Mass Index (BMI) 40.0 and over, adult: Secondary | ICD-10-CM | POA: Diagnosis not present

## 2022-12-03 LAB — AMMONIA: Ammonia: 85 umol/L — ABNORMAL HIGH (ref 9–35)

## 2022-12-03 MED ORDER — LACTULOSE 10 GM/15ML PO SOLN
20.0000 g | Freq: Once | ORAL | Status: AC
  Administered 2022-12-03: 20 g via ORAL
  Filled 2022-12-03: qty 30

## 2022-12-03 NOTE — Progress Notes (Addendum)
PROGRESS NOTE    [name]  VFI:[id number] DOB: 02-04-1955 DOA: 11/30/2022 PCP: [name], MD  Subjective: Pt seen and examined. Pt's mentation is better but son [name] says pt is still a little confused. Needed help feeding herself which she normally does not need.  NH3 down to 85 today.   Hospital Course: HPI: [name] is a 67 y.o. female with medical history significant of alcohol fatty liver cirrhosis, chronic diarrhea, hypertension, obesity, PE/DVT, short gut syndrome, hypothyroidism who presents with increasing confusion.   History obtained documentation and son over the phone. Pt is a limited historian.    Son brought her in today because she was more confused and had less bowel movement. Son gives her Xifaxan daily and only gives her Lactulose as needed at the instruction of GI because she has chronic diarrhea. If she took Lactulose daily she would have at least 6 bowel movements and get dehydrated.  Son has a special word he ask her and if she can't give that word or if she talks slow those would be signs he looks for to give her lactulose.  Last week she had lab work showing elevated ammonia in the 100 and had less bowel movement so son did give her a few doses of Lactulose. Today prior to coming to ED he gave her a dose of Lactulose and reports already 6 small bowel movements. She then had a large one in ED right after receiving additional Lactulose here.    Of note, Patient recently hospitalized from 10/7 to 10/21 with hepatic encephalopathy due to missing her lactulose.  She also underwent kyphoplasty on 10/17 due to acute on chronic back pain T12 compression fracture.   Prior to that she was hospitalized from 09/30/2022 to 10/12/2022 for sepsis secondary to Pseudomonas UTI with bacteremia and completed a course of meropenem.     On arrival to the ED, she was afebrile and normotensive on room air.   CBC without leukocytosis, hemoglobin at of 9.5 with a baseline  of 8.  Platelets within normal limits.   CMP with mild hypokalemia of 3.3.  Otherwise normal LFTs and mildly elevated total bilirubin of 1.8.  LFTs were negative.  Lipase of 55 which is similar to prior.   Ammonia level was elevated to 152.   Troponin is mildly elevated 18.   UA showed trace leukocyte, negative nitrite and rare bacteria.   Patient was given additional lactulose in the ED and hospitalist was consulted for admission.    Significant Events: Admitted 11/30/2022 for hepatic encephalopathy   Significant Labs: Admission NH3 152, WBC 5.8, HgB 9.5, K 3.3  Significant Imaging Studies:   Antibiotic Therapy: Anti-infectives (From admission, onward)    Start     Dose/Rate Route Frequency Ordered Stop   12/01/22 1000  rifaximin (XIFAXAN) tablet 550 mg        550 mg Oral 2 times daily 11/30/22 2244         Procedures:   Consultants:     Assessment and Plan: * Acute hepatic encephalopathy (HCC) 11-30-2022 pt has difficulty with ammonia level due to issues with chronic diarrhea. She is on Xifaxan daily and can only take Lactulose as needed because it causes her to have  excessive diarrhea and dehydration.  -has already been given additional Lactulose in ED. Will just continue Xifaxan for now and follow clinically.   12-01-2022 improved mentation and NH3. Will give 1 more dose of lactulose today. Repeat NH3 level in AM. If pt  remains stable, probably DC in AM. Discussed with pt's son [name] who is agreeable.  12-02-2022 NH3 was to 136 today. Will give more lactulose today. Repeat NH3 in AM.  12-03-2022 NH3 down to 85. Pt's son [name] states pt's mentation normal when her NH3 around 60s. Will give another dose of lactulose today. Told son that pt needs to take lactulose at least once a day. Could split dose so she gets 10 g in AM and 10 g in the PM.  Vaginal bleeding 11-30-2022  Patient developed vaginal bleeding during her hospitalization for ESBL UTI and bacteremia in  September.  She has been on Megace 40mg  BID.  -She had follow-up with GYN on 10/30/2022 and still awaiting appointment to undergo endometrial biopsy and Pap smear and to continue Megace until further evaluation.No recent vaginal bleeding.    Hypokalemia 11-30-2022 replete with oral potassium ,  -continue home BID potassium  12-01-2022 resolved.  Morbid obesity (HCC) 11-30-2022 chronic  12-01-2022 BMI 40.7  Short gut syndrome 12-01-2022 chronic. Pt with chronic diarrhea. This limits her use of lactulose for her hepatic encephalopathy.  12-03-2022 stable.  Hypotension 12-01-2022-chronic. Continue midodrine 12-02-2022 BP stable. On midodrine. 12-03-2022 stable.  Hypothyroidism 11-30-2022 had mildly elevated TSH a month ago. Recommend repeat outpatient -continue levothyoxine  GERD (gastroesophageal reflux disease) 11-30-2022 Continue Pepcid and PPI 12-03-2022 Continue Pepcid and PPI  Iron deficiency anemia 11-30-2022- Hemoglobin stable near baseline  Cirrhosis due to NASH 11-30-2022 Compensated without ascites although is having some increased lower extremity edema- Continue home Lasix twice daily, spironolactone  12-01-2022 chronic.  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 Consistent with prior hospitalizations.       DVT prophylaxis: enoxaparin (LOVENOX) injection 40 mg Start: 11/30/22 2230    Code Status: Limited: Do not attempt resuscitation (DNR) -DNR-LIMITED -Do Not Intubate/DNI  Family Communication: discussed with pt's son [name] at bedside Disposition Plan: return home Reason for continuing need for hospitalization: remains on lactulose. Monitoring NH3  Objective: Vitals:   12/02/22 2102 12/03/22 0211 12/03/22 0634 12/03/22 1411   BP: (!) 103/54 (!) 143/75 (!) 152/73 118/67  Pulse: 77 83 86 84  Resp: 18 18 18 16   Temp: 98.6 F (37 C) 98.7 F (37.1 C) 99 F (37.2 C) 98.3 F (36.8 C)  TempSrc: Oral Oral Oral Oral  SpO2: 96% 98% 96% 99%    Intake/Output Summary (Last 24 hours) at 12/03/2022 1440 Last data filed at 12/03/2022 1119 Gross per 24 hour  Intake --  Output 600 ml  Net -600 ml   There were no vitals filed for this visit.  Examination:  Physical Exam Vitals and nursing note reviewed.  Constitutional:      Appearance: She is obese.  HENT:     Head: Normocephalic and atraumatic.  Eyes:     Comments: Left eye leukoma  Cardiovascular:     Rate and Rhythm: Normal rate.  Pulmonary:     Effort: Pulmonary effort is normal.     Breath sounds: Normal breath sounds.  Abdominal:     General: Abdomen is protuberant. Bowel sounds are normal. There is no distension.  Skin:    General: Skin is warm and dry.     Capillary Refill: Capillary refill takes less than 2 seconds.  Neurological:     Mental Status: She is alert and  oriented to person, place, and time.     Data Reviewed: I have personally reviewed following labs and imaging studies  CBC: Recent Labs  Lab 11/30/22 1805 12/01/22 0551  WBC 5.8 5.3  NEUTROABS 3.1  --   HGB 9.5* 9.2*  HCT 30.9* 29.2*  MCV 95.1 93.3  PLT 168 135*   Basic Metabolic Panel: Recent Labs  Lab 11/30/22 1805 12/01/22 0551 12/02/22 0600  NA 136 138 140  K 3.3* 3.6 3.9  CL 106 110 112*  CO2 22 21* 22  GLUCOSE 165* 89 96  BUN 19 20 19   CREATININE 1.18* 1.10* 1.04*  CALCIUM 8.3* 8.3* 8.2*  MG  --   --  1.8   GFR: CrCl cannot be calculated (Unknown ideal weight.). Liver Function Tests: Recent Labs  Lab 11/30/22 1805 12/02/22 0600  AST 30 25  ALT 18 15  ALKPHOS 106 87  BILITOT 1.8* 1.7*  PROT 6.3* 4.7*  ALBUMIN 2.7* 2.1*   Recent Labs  Lab 11/30/22 1805  LIPASE 55*   Recent Labs  Lab 11/30/22 1805 12/01/22 0551 12/02/22 0600  12/03/22 0801  AMMONIA 152* 92* 136* 85*   CBG: Recent Labs  Lab 11/30/22 1809  GLUCAP 141*   Scheduled Meds:  atorvastatin  40 mg Oral Daily   enoxaparin (LOVENOX) injection  40 mg Subcutaneous QHS   famotidine  20 mg Oral Daily   furosemide  40 mg Oral BID   lactulose  20 g Oral Once   levothyroxine  150 mcg Oral QAC breakfast   megestrol  40 mg Oral BID   methocarbamol  500 mg Oral Daily   midodrine  10 mg Oral TID WC   pantoprazole  40 mg Oral BID AC   potassium chloride  20 mEq Oral BID   rifaximin  550 mg Oral BID   rOPINIRole  0.25 mg Oral Daily   spironolactone  25 mg Oral Daily   tamsulosin  0.4 mg Oral Daily   venlafaxine XR  75 mg Oral Q breakfast   Continuous Infusions:   LOS: 0 days   Time spent: 35 minutes  Carollee Herter, DO  [facility]  12/03/2022, 2:40 PM

## 2022-12-04 DIAGNOSIS — K90829 Short bowel syndrome, unspecified: Secondary | ICD-10-CM | POA: Diagnosis not present

## 2022-12-04 DIAGNOSIS — K7460 Unspecified cirrhosis of liver: Secondary | ICD-10-CM | POA: Diagnosis not present

## 2022-12-04 DIAGNOSIS — K7682 Hepatic encephalopathy: Secondary | ICD-10-CM | POA: Diagnosis not present

## 2022-12-04 LAB — COMPREHENSIVE METABOLIC PANEL
ALT: 15 U/L (ref 0–44)
AST: 27 U/L (ref 15–41)
Albumin: 2.2 g/dL — ABNORMAL LOW (ref 3.5–5.0)
Alkaline Phosphatase: 88 U/L (ref 38–126)
Anion gap: 7 (ref 5–15)
BUN: 15 mg/dL (ref 8–23)
CO2: 23 mmol/L (ref 22–32)
Calcium: 8.4 mg/dL — ABNORMAL LOW (ref 8.9–10.3)
Chloride: 109 mmol/L (ref 98–111)
Creatinine, Ser: 1 mg/dL (ref 0.44–1.00)
GFR, Estimated: >60 mL/min (ref >60)
Glucose, Bld: 80 mg/dL (ref 70–99)
Potassium: 4 mmol/L (ref 3.5–5.1)
Sodium: 139 mmol/L (ref 135–145)
Total Bilirubin: 2 mg/dL — ABNORMAL HIGH (ref <1.2)
Total Protein: 5.2 g/dL — ABNORMAL LOW (ref 6.5–8.1)

## 2022-12-04 LAB — AMMONIA: Ammonia: 91 umol/L — ABNORMAL HIGH (ref 9–35)

## 2022-12-04 MED ORDER — LACTULOSE 10 GM/15ML PO SOLN
20.0000 g | Freq: Two times a day (BID) | ORAL | Status: DC
  Administered 2022-12-04 (×2): 20 g via ORAL
  Filled 2022-12-04 (×3): qty 30

## 2022-12-04 MED ORDER — FUROSEMIDE 20 MG PO TABS
20.0000 mg | ORAL_TABLET | Freq: Two times a day (BID) | ORAL | Status: DC
  Administered 2022-12-04 – 2022-12-05 (×2): 20 mg via ORAL
  Filled 2022-12-04 (×2): qty 1

## 2022-12-04 MED ORDER — NYSTATIN 100000 UNIT/GM EX POWD
Freq: Three times a day (TID) | CUTANEOUS | Status: DC
  Filled 2022-12-04: qty 15

## 2022-12-04 MED ORDER — DIPHENHYDRAMINE HCL 25 MG PO CAPS
25.0000 mg | ORAL_CAPSULE | Freq: Once | ORAL | Status: AC | PRN
  Administered 2022-12-04: 25 mg via ORAL
  Filled 2022-12-04: qty 1

## 2022-12-04 NOTE — Progress Notes (Signed)
Mobility Specialist - Progress Note   12/04/22 1000  Mobility  Activity Ambulated with assistance in hallway  Level of Assistance Contact guard assist, steadying assist  Assistive Device Front wheel walker  Distance Ambulated (ft) 90 ft  Range of Motion/Exercises Active  Activity Response Tolerated well  Mobility Referral Yes  $Mobility charge 1 Mobility  Mobility Specialist Start Time (ACUTE ONLY) 1011  Mobility Specialist Stop Time (ACUTE ONLY) 1022  Mobility Specialist Time Calculation (min) (ACUTE ONLY) 11 min   Received in chair and agreed to mobility. Had no issues throughout session. Returned to chair with all needs met.  Marilynne Halsted Mobility Specialist

## 2022-12-04 NOTE — Progress Notes (Addendum)
PROGRESS NOTE    [name]  ZOX:[id number] DOB: May 21, 1955 DOA: 11/30/2022 PCP: [name], MD  Subjective: Pt seen and examined. Pt still confused per son [name] helped NT give pt a bath today. He seems very devoted to his mom's care. NH3 91 today.    Hospital Course: HPI: [name] is a 67 y.o. female with medical history significant of alcohol fatty liver cirrhosis, chronic diarrhea, hypertension, obesity, PE/DVT, short gut syndrome, hypothyroidism who presents with increasing confusion.   History obtained documentation and son over the phone. Pt is a limited historian.    Son brought her in today because she was more confused and had less bowel movement. Son gives her Xifaxan daily and only gives her Lactulose as needed at the instruction of GI because she has chronic diarrhea. If she took Lactulose daily she would have at least 6 bowel movements and get dehydrated.  Son has a special word he ask her and if she can't give that word or if she talks slow those would be signs he looks for to give her lactulose.  Last week she had lab work showing elevated ammonia in the 100 and had less bowel movement so son did give her a few doses of Lactulose. Today prior to coming to ED he gave her a dose of Lactulose and reports already 6 small bowel movements. She then had a large one in ED right after receiving additional Lactulose here.    Of note, Patient recently hospitalized from 10/7 to 10/21 with hepatic encephalopathy due to missing her lactulose.  She also underwent kyphoplasty on 10/17 due to acute on chronic back pain T12 compression fracture.   Prior to that she was hospitalized from 09/30/2022 to 10/12/2022 for sepsis secondary to Pseudomonas UTI with bacteremia and completed a course of meropenem.     On arrival to the ED, she was afebrile and normotensive on room air.   CBC without leukocytosis, hemoglobin at of 9.5 with a baseline of 8.  Platelets within normal  limits.   CMP with mild hypokalemia of 3.3.  Otherwise normal LFTs and mildly elevated total bilirubin of 1.8.  LFTs were negative.  Lipase of 55 which is similar to prior.   Ammonia level was elevated to 152.   Troponin is mildly elevated 18.   UA showed trace leukocyte, negative nitrite and rare bacteria.   Patient was given additional lactulose in the ED and hospitalist was consulted for admission.    Significant Events: Admitted 11/30/2022 for hepatic encephalopathy   Significant Labs: Admission NH3 152, WBC 5.8, HgB 9.5, K 3.3  Significant Imaging Studies:   Antibiotic Therapy: Anti-infectives (From admission, onward)    Start     Dose/Rate Route Frequency Ordered Stop   12/01/22 1000  rifaximin (XIFAXAN) tablet 550 mg        550 mg Oral 2 times daily 11/30/22 2244         Procedures:   Consultants:     Assessment and Plan: * Acute hepatic encephalopathy (HCC) 11-30-2022 pt has difficulty with ammonia level due to issues with chronic diarrhea. She is on Xifaxan daily and can only take Lactulose as needed because it causes her to have  excessive diarrhea and dehydration.  -has already been given additional Lactulose in ED. Will just continue Xifaxan for now and follow clinically.   12-01-2022 improved mentation and NH3. Will give 1 more dose of lactulose today. Repeat NH3 level in AM. If pt remains stable, probably  DC in AM. Discussed with pt's [name] who is agreeable.  12-02-2022 NH3 was to 136 today. Will give more lactulose today. Repeat NH3 in AM.  12-03-2022 NH3 down to 85. Pt's [name] states pt's mentation normal when her NH3 around 60s. Will give another dose of lactulose today. Told son that pt needs to take lactulose at least once a day. Could split dose so she gets 10 g in AM and 10 g in the PM.  12-04-2022 NH3 still 91 today and pt still confused. Discussed with son that pt will need to be on 20 mg bid. Continue to monitor her mentation. Give her  short gut syndrome and hepatic encephalopathy, may need to accept some degree of encephalopathy in order not to dehydrate her with too much diarrhea.  Vaginal bleeding 11-30-2022  Patient developed vaginal bleeding during her hospitalization for ESBL UTI and bacteremia in September.  She has been on Megace 40mg  BID.  -She had follow-up with GYN on 10/30/2022 and still awaiting appointment to undergo endometrial biopsy and Pap smear and to continue Megace until further evaluation.No recent vaginal bleeding.    Hypokalemia 11-30-2022 replete with oral potassium ,  -continue home BID potassium  12-01-2022 resolved.  Morbid obesity (HCC) 11-30-2022 chronic  12-01-2022 BMI 40.7  Short gut syndrome 12-01-2022 chronic. Pt with chronic diarrhea. This limits her use of lactulose for her hepatic encephalopathy.  12-03-2022 stable.  Hypotension 12-01-2022-chronic. Continue midodrine 12-02-2022 BP stable. On midodrine. 12-03-2022 stable.  Hypothyroidism 11-30-2022 had mildly elevated TSH a month ago. Recommend repeat outpatient -continue levothyoxine  GERD (gastroesophageal reflux disease) 11-30-2022 Continue Pepcid and PPI 12-03-2022 Continue Pepcid and PPI  Iron deficiency anemia 11-30-2022- Hemoglobin stable near baseline  Cirrhosis due to NASH 11-30-2022 Compensated without ascites although is having some increased lower extremity edema- Continue home Lasix twice daily, spironolactone  12-01-2022 chronic.  12-04-2022 will reduce dose of lasix to 20 mg bid since her diarrhea is going to be more frequent with lactulose bid.  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 Consistent with prior hospitalizations.   DVT prophylaxis: enoxaparin (LOVENOX)  injection 40 mg Start: 11/30/22 2230     Code Status: Limited: Do not attempt resuscitation (DNR) -DNR-LIMITED -Do Not Intubate/DNI  Family Communication: discussed with [name] at bedside Disposition Plan: return home Reason for continuing need for hospitalization: ongoing management of acute hepatic encephalopathy  Objective: Vitals:   12/03/22 0634 12/03/22 1411 12/03/22 2018 12/04/22 0513  BP: (!) 152/73 118/67 125/62 (!) 159/90  Pulse: 86 84 70 89  Resp: 18 16 18 18   Temp: 99 F (37.2 C) 98.3 F (36.8 C) 98.3 F (36.8 C) 99.1 F (37.3 C)  TempSrc: Oral Oral Oral Oral  SpO2: 96% 99% 99% 97%    Intake/Output Summary (Last 24 hours) at 12/04/2022 1241 Last data filed at 12/04/2022 8295 Gross per 24 hour  Intake --  Output 1750 ml  Net -1750 ml   There were no vitals filed for this visit.  Examination:  Physical Exam Vitals and nursing note reviewed.  Constitutional:      General: She is not in acute distress.    Appearance: She is obese. She is not toxic-appearing or diaphoretic.  HENT:     Head: Normocephalic and atraumatic.     Nose: Nose normal.  Eyes:  Comments: Left eye leukoma  Cardiovascular:     Rate and Rhythm: Normal rate and regular rhythm.  Pulmonary:     Effort: Pulmonary effort is normal.     Breath sounds: Normal breath sounds.  Abdominal:     General: Abdomen is protuberant. Bowel sounds are normal. There is no distension.  Musculoskeletal:     Right lower leg: No edema.     Left lower leg: No edema.  Skin:    General: Skin is warm and dry.     Capillary Refill: Capillary refill takes less than 2 seconds.  Neurological:     Mental Status: She is alert.     Comments: Slightly confused     Data Reviewed: I have personally reviewed following labs and imaging studies  CBC: Recent Labs  Lab 11/30/22 1805 12/01/22 0551  WBC 5.8 5.3  NEUTROABS 3.1  --   HGB 9.5* 9.2*  HCT 30.9* 29.2*  MCV 95.1 93.3  PLT 168 135*   Basic  Metabolic Panel: Recent Labs  Lab 11/30/22 1805 12/01/22 0551 12/02/22 0600 12/04/22 0831  NA 136 138 140 139  K 3.3* 3.6 3.9 4.0  CL 106 110 112* 109  CO2 22 21* 22 23  GLUCOSE 165* 89 96 80  BUN 19 20 19 15   CREATININE 1.18* 1.10* 1.04* 1.00  CALCIUM 8.3* 8.3* 8.2* 8.4*  MG  --   --  1.8  --    GFR: CrCl cannot be calculated (Unknown ideal weight.). Liver Function Tests: Recent Labs  Lab 11/30/22 1805 12/02/22 0600 12/04/22 0831  AST 30 25 27   ALT 18 15 15   ALKPHOS 106 87 88  BILITOT 1.8* 1.7* 2.0*  PROT 6.3* 4.7* 5.2*  ALBUMIN 2.7* 2.1* 2.2*   Recent Labs  Lab 11/30/22 1805  LIPASE 55*   Recent Labs  Lab 11/30/22 1805 12/01/22 0551 12/02/22 0600 12/03/22 0801 12/04/22 0831  AMMONIA 152* 92* 136* 85* 91*   BNP (last 3 results) Recent Labs    08/30/22 0354  BNP 614.9*   CBG: Recent Labs  Lab 11/30/22 1809  GLUCAP 141*    Scheduled Meds:  atorvastatin  40 mg Oral Daily   enoxaparin (LOVENOX) injection  40 mg Subcutaneous QHS   famotidine  20 mg Oral Daily   furosemide  20 mg Oral BID   lactulose  20 g Oral BID   levothyroxine  150 mcg Oral QAC breakfast   megestrol  40 mg Oral BID   methocarbamol  500 mg Oral Daily   midodrine  10 mg Oral TID WC   nystatin   Topical TID   pantoprazole  40 mg Oral BID AC   potassium chloride  20 mEq Oral BID   rifaximin  550 mg Oral BID   rOPINIRole  0.25 mg Oral Daily   spironolactone  25 mg Oral Daily   tamsulosin  0.4 mg Oral Daily   venlafaxine XR  75 mg Oral Q breakfast   Continuous Infusions:   LOS: 1 day   Time spent: 45 minutes  Carollee Herter, DO  [facility]  12/04/2022, 12:41 PM

## 2022-12-05 DIAGNOSIS — K7682 Hepatic encephalopathy: Secondary | ICD-10-CM | POA: Diagnosis not present

## 2022-12-05 LAB — AMMONIA: Ammonia: 95 umol/L — ABNORMAL HIGH (ref 9–35)

## 2022-12-05 MED ORDER — LACTULOSE 10 GM/15ML PO SOLN
30.0000 g | Freq: Three times a day (TID) | ORAL | Status: DC
  Administered 2022-12-05: 30 g via ORAL
  Filled 2022-12-05: qty 45

## 2022-12-05 MED ORDER — POTASSIUM CHLORIDE ER 10 MEQ PO TBCR: 20.0000 meq | EXTENDED_RELEASE_TABLET | Freq: Two times a day (BID) | ORAL | 0 refills | Status: DC

## 2022-12-05 NOTE — Discharge Summary (Signed)
Physician Discharge Summary  [name] ZOX:[id number] DOB: [birth year] DOA: 11/30/2022  PCP: [name], MD  Admit date: 11/30/2022 Discharge date: 12/05/2022 Recommendations for Outpatient Follow-up:  Follow up with PCP and GI in 1 weeks-call for appointment Please obtain BMP/CBC in one week  Discharge Dispo: Home Discharge Condition: Stable Code Status:   Code Status: Limited: Do not attempt resuscitation (DNR) -DNR-LIMITED -Do Not Intubate/DNI  Diet recommendation:  Diet Order             Diet 2 gram sodium Room service appropriate? Yes; Fluid consistency: Thin  Diet effective now                    Brief/Interim Summary: 67 y.o. female with medical history significant of alcohol fatty liver cirrhosis, chronic diarrhea, hypertension, obesity, PE/DVT, short gut syndrome, hypothyroidism who presents with increasing confusion. History obtained documentation and son over the phone. Pt is a limited historian.  Son brought her in because she was more confused and had less bowel movement. Son gives her Xifaxan daily and only gives her Lactulose as needed at the instruction of GI because she has chronic diarrhea. If she took Lactulose daily she would have at least 6 bowel movements and get dehydrated.  Son has a special word he ask her and if she can't give that word or if she talks slow those would be signs he looks for to give her lactulose.  Last week she had lab work showing elevated ammonia in the 100 and had less bowel movement so son did give her a few doses of Lactulose. Today prior to coming to ED he gave her a dose of Lactulose and reports already 6 small bowel movements. She then had a large one in ED right after receiving additional Lactulose here.  Of note, Patient recently hospitalized from 10/7 to 10/21 with hepatic encephalopathy due to missing her lactulose.  She also underwent kyphoplasty on 10/17 due to acute on chronic back pain T12 compression fracture. Prior to  that she was hospitalized from 09/30/2022 to 10/12/2022 for sepsis secondary to Pseudomonas UTI with bacteremia and completed a course of meropenem. In the ED afebrile normotensive on room air. Admission NH3 152, WBC 5.8, HgB 9.5, K 3.3.  CBC: Hemoglobin of 9.5 g from baseline 8 Lipase of 55 which is similar to prior. UA showed trace leukocyte, negative nitrite and rare bacteria. Patient was given additional lactulose in the ED and hospitalist was consulted for admission for hepatic encephalopathy. Patient lactulose was continued seen and start lactulose dose to have bowel movements Mental status has improved clinically.  Patient's son at the bedside agreeable for discharge home I have advised her to take lactulose 3 times daily and titrate down if having frequent BM more than 4. 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  Continue lactulose 3 times daily from twice daily and titrate down if having multiple bowel movement, continue rifaximin follow-up with GI as outpatient.  I explained to  the patient's son that ammonia level will not correlate degree of mental status changes.  Ambulating fairly well tolerating diet able to communicate and she is stable for discharge.   Liver cirrhosis due to NASH: Compensated without ascites continue her Aldactone, Lasix rifaximin as above.  Follow-up with GI as outpatient  Vaginal bleeding: 11-30-2022  Patient developed vaginal bleeding during her  hospitalization for ESBL UTI and bacteremia in September.  She has been on Megace 40mg  BID.  -She had follow-up with GYN on 10/30/2022 and still awaiting appointment to undergo endometrial biopsy and Pap smear and to continue Megace until further evaluation.No recent vaginal bleeding  Hypokalemia: Replaced  Short gut syndrome With chronic diarrhea.  This limits her lactulose use.  Hypotension: Chronically on midodrine.  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 Consistent with prior hospitalizations  Morbid obesity: Advised to lose weight  Consults: none Subjective: Alert awake oriented resting comfortably.  Discharge Exam: Vitals:   12/04/22 2003 12/05/22 0446  BP: 134/63 110/62  Pulse: 73 77  Resp: 18 18  Temp: 98.2 F (36.8 C) 98.4 F (36.9 C)  SpO2: 98% 99%   General: Pt is alert, awake, not in acute distress Cardiovascular: RRR, S1/S2 +, no rubs, no gallops Respiratory: CTA bilaterally, no wheezing, no rhonchi Abdominal: Soft, NT, ND, bowel sounds + Extremities: no edema, no cyanosis  Discharge Instructions  Discharge Instructions     Discharge instructions   Complete by: As directed    Please call call MD or return to ER for similar or worsening recurring problem that brought you to hospital or if any fever,nausea/vomiting,abdominal pain, uncontrolled pain, chest pain,  shortness of breath or any other alarming symptoms.  Please follow-up your doctor as instructed in a week time and call the office for appointment.  Please avoid alcohol, smoking, or any other illicit substance and maintain healthy habits including taking your regular medications as  prescribed.  You were cared for by a hospitalist during your hospital stay. If you have any questions about your discharge medications or the care you received while you were in the hospital after you are discharged, you can call the unit and ask to speak with the hospitalist on call if the hospitalist that took care of you is not available.  Once you are discharged, your primary care physician will handle any further medical issues. Please note that NO REFILLS for any discharge medications will be authorized once you are discharged, as it is imperative that you return to your primary care physician (or establish a relationship with a primary care physician if you do not have one) for your aftercare needs so that they can reassess your need for medications and monitor your lab values   Increase activity slowly   Complete by: As directed    No wound care   Complete by: As directed       Allergies as of 12/05/2022       Reactions   Ace Inhibitors Anaphylaxis, Swelling   Ivp Dye [iodinated Contrast Media] Anaphylaxis   Desitin [zinc Oxide] Rash, Other (See Comments)   Worsens rash   Gadolinium Derivatives Other (See Comments)   Reaction??   Hydroxyzine Other (See Comments)   Affects the mind   Chlorhexidine Rash, Other (  See Comments)   WORSENS RASHES   Doxycycline Rash   Penicillins Rash   Zofran [ondansetron] Rash        Medication List     STOP taking these medications    HYDROcodone-acetaminophen 10-325 MG tablet Commonly known as: NORCO       TAKE these medications    atorvastatin 40 MG tablet Commonly known as: LIPITOR Take 40 mg by mouth in the morning.   colchicine 0.6 MG tablet Take 0.6 mg by mouth daily as needed (gout flares).   cyanocobalamin 1000 MCG tablet Commonly known as: VITAMIN B12 Take 2,000 mcg by mouth every Friday.   dicyclomine 10 MG capsule Commonly known as: BENTYL Take 1 capsule (10 mg total) by mouth 3 (three) times daily as needed for  spasms (abdominal cramping).   EPINEPHrine 0.3 mg/0.3 mL Soaj injection Commonly known as: EPI-PEN Inject 0.3 mg into the muscle as needed for anaphylaxis.   famotidine 20 MG tablet Commonly known as: PEPCID Take 20 mg by mouth in the morning.   folic acid 1 MG tablet Commonly known as: FOLVITE Take 1 tablet (1 mg total) by mouth daily.   furosemide 40 MG tablet Commonly known as: LASIX Take 1 tablet (40 mg total) by mouth daily. Take additional 40 mg if there is a weight gain of 3 lbs in1 day or 5 lbs in 1 week. What changed:  when to take this additional instructions   IRON-VITAMIN C PO Take 1 tablet by mouth daily with breakfast.   lactulose 10 GM/15ML solution Commonly known as: CHRONULAC Take 30 mLs (20 g total) by mouth 3 (three) times daily. Take to ensure that there is 2 loose bowel movement EVERY DAY What changed:  how much to take when to take this reasons to take this additional instructions   levothyroxine 150 MCG tablet Commonly known as: SYNTHROID Take 150 mcg by mouth daily before breakfast.   lidocaine 5 % Commonly known as: LIDODERM Place 1 patch onto the skin daily. Remove & Discard patch within 12 hours or as directed by MD What changed:  when to take this reasons to take this additional instructions   megestrol 40 MG tablet Commonly known as: MEGACE Take 1 tablet (40 mg total) by mouth 2 (two) times daily.   methocarbamol 500 MG tablet Commonly known as: ROBAXIN Take 500 mg by mouth in the morning.   midodrine 10 MG tablet Commonly known as: PROAMATINE Take 1 tablet (10 mg total) by mouth 3 (three) times daily with meals.   naloxone 4 MG/0.1ML Liqd nasal spray kit Commonly known as: NARCAN Place 1 spray into the nose as needed (accidental overdose).   nystatin powder Apply 1 Application topically 3 (three) times daily as needed (for irritation- affected areas). What changed: Another medication with the same name was changed. Make sure  you understand how and when to take each.   nystatin cream Commonly known as: MYCOSTATIN Apply topically 2 (two) times daily. What changed:  how much to take when to take this reasons to take this   pantoprazole 40 MG tablet Commonly known as: PROTONIX Take 1 tablet (40 mg total) by mouth 2 (two) times daily before a meal.   potassium chloride 10 MEQ tablet Commonly known as: KLOR-CON Take 20 mEq by mouth in the morning and at bedtime.   promethazine 12.5 MG tablet Commonly known as: PHENERGAN Take 12.5 mg by mouth every 6 (six) hours as needed for nausea or vomiting.   rifaximin  550 MG Tabs tablet Commonly known as: XIFAXAN Take 550 mg by mouth 2 (two) times daily.   rOPINIRole 0.25 MG tablet Commonly known as: REQUIP Take 1 tablet (0.25 mg total) by mouth at bedtime. What changed: when to take this   spironolactone 25 MG tablet Commonly known as: ALDACTONE Take 1 tablet (25 mg total) by mouth daily.   tamsulosin 0.4 MG Caps capsule Commonly known as: FLOMAX Take 1 capsule (0.4 mg total) by mouth daily.   triamcinolone cream 0.5 % Commonly known as: KENALOG Apply to affected area of right breast twice daily as directed. What changed:  how much to take when to take this reasons to take this   TYLENOL 500 MG tablet Generic drug: acetaminophen Take 1,000 mg by mouth every 6 (six) hours as needed for mild pain (pain score 1-3) or headache.   venlafaxine XR 75 MG 24 hr capsule Commonly known as: EFFEXOR-XR Take 75 mg by mouth daily with breakfast.   Vitamin D (Ergocalciferol) 1.25 MG (50000 UNIT) Caps capsule Commonly known as: DRISDOL Take 50,000 Units by mouth every Wednesday.        Follow-up Information     [name], MD Follow up in 1 week(s).   Specialty: Family Medicine Contact information: [address] [place] [address] [phone]                Allergies  Allergen Reactions   Ace Inhibitors Anaphylaxis and  Swelling   Ivp Dye [Iodinated Contrast Media] Anaphylaxis   Desitin [Zinc Oxide] Rash and Other (See Comments)    Worsens rash   Gadolinium Derivatives Other (See Comments)    Reaction??   Hydroxyzine Other (See Comments)    Affects the mind   Chlorhexidine Rash and Other (See Comments)    WORSENS RASHES   Doxycycline Rash   Penicillins Rash   Zofran [Ondansetron] Rash    The results of significant diagnostics from this hospitalization (including imaging, microbiology, ancillary and laboratory) are listed below for reference.    Microbiology: No results found for this or any previous visit (from the past 240 hour(s)).  Procedures/Studies: No results found.  Labs: BNP (last 3 results) Recent Labs    08/30/22 0354  BNP 614.9*   Basic Metabolic Panel: Recent Labs  Lab 11/30/22 1805 12/01/22 0551 12/02/22 0600 12/04/22 0831  NA 136 138 140 139  K 3.3* 3.6 3.9 4.0  CL 106 110 112* 109  CO2 22 21* 22 23  GLUCOSE 165* 89 96 80  BUN 19 20 19 15   CREATININE 1.18* 1.10* 1.04* 1.00  CALCIUM 8.3* 8.3* 8.2* 8.4*  MG  --   --  1.8  --    Liver Function Tests: Recent Labs  Lab 11/30/22 1805 12/02/22 0600 12/04/22 0831  AST 30 25 27   ALT 18 15 15   ALKPHOS 106 87 88  BILITOT 1.8* 1.7* 2.0*  PROT 6.3* 4.7* 5.2*  ALBUMIN 2.7* 2.1* 2.2*   Recent Labs  Lab 11/30/22 1805  LIPASE 55*   Recent Labs  Lab 12/01/22 0551 12/02/22 0600 12/03/22 0801 12/04/22 0831 12/05/22 0455  AMMONIA 92* 136* 85* 91* 95*   CBC: Recent Labs  Lab 11/30/22 1805 12/01/22 0551  WBC 5.8 5.3  NEUTROABS 3.1  --   HGB 9.5* 9.2*  HCT 30.9* 29.2*  MCV 95.1 93.3  PLT 168 135*   Cardiac Enzymes: No results for input(s): "CKTOTAL", "CKMB", "CKMBINDEX", "TROPONINI" in the last 168 hours. BNP: Invalid input(s): "  POCBNP" CBG: Recent Labs  Lab 11/30/22 1809  GLUCAP 141*   D-Dimer No results for input(s): "DDIMER" in the last 72 hours. Hgb A1c No results for input(s): "HGBA1C" in  the last 72 hours. Lipid Profile No results for input(s): "CHOL", "HDL", "LDLCALC", "TRIG", "CHOLHDL", "LDLDIRECT" in the last 72 hours. Thyroid function studies No results for input(s): "TSH", "T4TOTAL", "T3FREE", "THYROIDAB" in the last 72 hours.  Invalid input(s): "FREET3" Anemia work up No results for input(s): "VITAMINB12", "FOLATE", "FERRITIN", "TIBC", "IRON", "RETICCTPCT" in the last 72 hours. Urinalysis    Component Value Date/Time   COLORURINE YELLOW 11/30/2022 1823   APPEARANCEUR HAZY (A) 11/30/2022 1823   LABSPEC 1.014 11/30/2022 1823   PHURINE 5.0 11/30/2022 1823   GLUCOSEU NEGATIVE 11/30/2022 1823   HGBUR MODERATE (A) 11/30/2022 1823   BILIRUBINUR NEGATIVE 11/30/2022 1823   KETONESUR NEGATIVE 11/30/2022 1823   PROTEINUR NEGATIVE 11/30/2022 1823   UROBILINOGEN 0.2 08/21/2013 0017   NITRITE NEGATIVE 11/30/2022 1823   LEUKOCYTESUR TRACE (A) 11/30/2022 1823   Sepsis Labs Recent Labs  Lab 11/30/22 1805 12/01/22 0551  WBC 5.8 5.3   Microbiology No results found for this or any previous visit (from the past 240 hour(s)).  Time coordinating discharge: 35 minutes  SIGNED: Lanae Boast, MD  [facility] 12/05/2022, 11:39 AM  If 7PM-7AM, please contact night-coverage [email or web address]

## 2022-12-05 NOTE — Progress Notes (Signed)
Mobility Specialist - Progress Note   12/05/22 0900  Mobility  Activity Ambulated with assistance in hallway  Level of Assistance Contact guard assist, steadying assist  Assistive Device Front wheel walker  Distance Ambulated (ft) 150 ft  Range of Motion/Exercises Active  Activity Response Tolerated well  Mobility Referral Yes  $Mobility charge 1 Mobility  Mobility Specialist Start Time (ACUTE ONLY) [id number]  Mobility Specialist Stop Time (ACUTE ONLY) 0850  Mobility Specialist Time Calculation (min) (ACUTE ONLY) 11 min   Received in bed and agreed to mobility, had no issues throughout session. And returned to chair with all needs met.  Marilynne Halsted Mobility Specialist

## 2022-12-10 ENCOUNTER — Encounter (HOSPITAL_COMMUNITY): Payer: Self-pay

## 2022-12-10 ENCOUNTER — Inpatient Hospital Stay (HOSPITAL_COMMUNITY)
Admission: EM | Admit: 2022-12-10 | Discharge: 2022-12-17 | DRG: 442 | Disposition: A | Discharge status: Home Health | Payer: Medicare (Managed Care) | Attending: Internal Medicine | Admitting: Internal Medicine

## 2022-12-10 ENCOUNTER — Other Ambulatory Visit: Payer: Self-pay

## 2022-12-10 DIAGNOSIS — E860 Dehydration: Secondary | ICD-10-CM | POA: Diagnosis present

## 2022-12-10 DIAGNOSIS — Z880 Allergy status to penicillin: Secondary | ICD-10-CM

## 2022-12-10 DIAGNOSIS — D696 Thrombocytopenia, unspecified: Secondary | ICD-10-CM | POA: Diagnosis present

## 2022-12-10 DIAGNOSIS — Z87442 Personal history of urinary calculi: Secondary | ICD-10-CM

## 2022-12-10 DIAGNOSIS — K7682 Hepatic encephalopathy: Secondary | ICD-10-CM | POA: Diagnosis not present

## 2022-12-10 DIAGNOSIS — Z91041 Radiographic dye allergy status: Secondary | ICD-10-CM

## 2022-12-10 DIAGNOSIS — Z6841 Body Mass Index (BMI) 40.0 and over, adult: Secondary | ICD-10-CM

## 2022-12-10 DIAGNOSIS — Z87440 Personal history of urinary (tract) infections: Secondary | ICD-10-CM

## 2022-12-10 DIAGNOSIS — E039 Hypothyroidism, unspecified: Secondary | ICD-10-CM | POA: Diagnosis present

## 2022-12-10 DIAGNOSIS — E66812 Obesity, class 2: Secondary | ICD-10-CM | POA: Diagnosis present

## 2022-12-10 DIAGNOSIS — I5189 Other ill-defined heart diseases: Secondary | ICD-10-CM

## 2022-12-10 DIAGNOSIS — E7220 Disorder of urea cycle metabolism, unspecified: Secondary | ICD-10-CM

## 2022-12-10 DIAGNOSIS — K7460 Unspecified cirrhosis of liver: Secondary | ICD-10-CM | POA: Diagnosis present

## 2022-12-10 DIAGNOSIS — Z66 Do not resuscitate: Secondary | ICD-10-CM | POA: Diagnosis present

## 2022-12-10 DIAGNOSIS — K90829 Short bowel syndrome, unspecified: Secondary | ICD-10-CM | POA: Diagnosis present

## 2022-12-10 DIAGNOSIS — H409 Unspecified glaucoma: Secondary | ICD-10-CM | POA: Diagnosis present

## 2022-12-10 DIAGNOSIS — Z881 Allergy status to other antibiotic agents status: Secondary | ICD-10-CM

## 2022-12-10 DIAGNOSIS — N939 Abnormal uterine and vaginal bleeding, unspecified: Secondary | ICD-10-CM | POA: Diagnosis present

## 2022-12-10 DIAGNOSIS — D509 Iron deficiency anemia, unspecified: Secondary | ICD-10-CM | POA: Diagnosis present

## 2022-12-10 DIAGNOSIS — E538 Deficiency of other specified B group vitamins: Secondary | ICD-10-CM | POA: Diagnosis present

## 2022-12-10 DIAGNOSIS — E66813 Obesity, class 3: Secondary | ICD-10-CM | POA: Diagnosis present

## 2022-12-10 DIAGNOSIS — K529 Noninfective gastroenteritis and colitis, unspecified: Secondary | ICD-10-CM | POA: Diagnosis present

## 2022-12-10 DIAGNOSIS — N179 Acute kidney failure, unspecified: Secondary | ICD-10-CM | POA: Diagnosis present

## 2022-12-10 DIAGNOSIS — Z86711 Personal history of pulmonary embolism: Secondary | ICD-10-CM

## 2022-12-10 DIAGNOSIS — R54 Age-related physical debility: Secondary | ICD-10-CM | POA: Diagnosis present

## 2022-12-10 DIAGNOSIS — F32A Depression, unspecified: Secondary | ICD-10-CM | POA: Insufficient documentation

## 2022-12-10 DIAGNOSIS — E669 Obesity, unspecified: Secondary | ICD-10-CM | POA: Diagnosis present

## 2022-12-10 DIAGNOSIS — R4182 Altered mental status, unspecified: Principal | ICD-10-CM

## 2022-12-10 DIAGNOSIS — I10 Essential (primary) hypertension: Secondary | ICD-10-CM | POA: Diagnosis present

## 2022-12-10 DIAGNOSIS — K7581 Nonalcoholic steatohepatitis (NASH): Secondary | ICD-10-CM | POA: Diagnosis present

## 2022-12-10 DIAGNOSIS — K219 Gastro-esophageal reflux disease without esophagitis: Secondary | ICD-10-CM | POA: Diagnosis present

## 2022-12-10 DIAGNOSIS — Z79890 Hormone replacement therapy (postmenopausal): Secondary | ICD-10-CM

## 2022-12-10 DIAGNOSIS — M5416 Radiculopathy, lumbar region: Secondary | ICD-10-CM | POA: Insufficient documentation

## 2022-12-10 DIAGNOSIS — Z86718 Personal history of other venous thrombosis and embolism: Secondary | ICD-10-CM

## 2022-12-10 DIAGNOSIS — Z87891 Personal history of nicotine dependence: Secondary | ICD-10-CM

## 2022-12-10 DIAGNOSIS — Z79899 Other long term (current) drug therapy: Secondary | ICD-10-CM

## 2022-12-10 DIAGNOSIS — F331 Major depressive disorder, recurrent, moderate: Secondary | ICD-10-CM | POA: Diagnosis present

## 2022-12-10 DIAGNOSIS — E876 Hypokalemia: Secondary | ICD-10-CM | POA: Diagnosis not present

## 2022-12-10 DIAGNOSIS — Z888 Allergy status to other drugs, medicaments and biological substances status: Secondary | ICD-10-CM

## 2022-12-10 HISTORY — DX: Hepatic encephalopathy: K76.82

## 2022-12-10 LAB — LIPASE, BLOOD: Lipase: 53 U/L — ABNORMAL HIGH (ref 11–51)

## 2022-12-10 LAB — COMPREHENSIVE METABOLIC PANEL
ALT: 22 U/L (ref 0–44)
AST: 37 U/L (ref 15–41)
Albumin: 2.7 g/dL — ABNORMAL LOW (ref 3.5–5.0)
Alkaline Phosphatase: 96 U/L (ref 38–126)
Anion gap: 8 (ref 5–15)
BUN: 28 mg/dL — ABNORMAL HIGH (ref 8–23)
CO2: 21 mmol/L — ABNORMAL LOW (ref 22–32)
Calcium: 8.8 mg/dL — ABNORMAL LOW (ref 8.9–10.3)
Chloride: 109 mmol/L (ref 98–111)
Creatinine, Ser: 1.81 mg/dL — ABNORMAL HIGH (ref 0.44–1.00)
GFR, Estimated: 31 mL/min — ABNORMAL LOW (ref >60)
Glucose, Bld: 112 mg/dL — ABNORMAL HIGH (ref 70–99)
Potassium: 4 mmol/L (ref 3.5–5.1)
Sodium: 138 mmol/L (ref 135–145)
Total Bilirubin: 1.4 mg/dL — ABNORMAL HIGH (ref <1.2)
Total Protein: 6.2 g/dL — ABNORMAL LOW (ref 6.5–8.1)

## 2022-12-10 LAB — PROTIME-INR
INR: 1.6 — ABNORMAL HIGH (ref 0.8–1.2)
Prothrombin Time: 19 s — ABNORMAL HIGH (ref 11.4–15.2)

## 2022-12-10 LAB — URINALYSIS, ROUTINE W REFLEX MICROSCOPIC
Bilirubin Urine: NEGATIVE
Glucose, UA: NEGATIVE mg/dL
Ketones, ur: NEGATIVE mg/dL
Leukocytes,Ua: NEGATIVE
Nitrite: NEGATIVE
Protein, ur: NEGATIVE mg/dL
Specific Gravity, Urine: 1.02 (ref 1.005–1.030)
pH: 5 (ref 5.0–8.0)

## 2022-12-10 LAB — AMMONIA: Ammonia: 159 umol/L — ABNORMAL HIGH (ref 9–35)

## 2022-12-10 LAB — CBC
HCT: 31.9 % — ABNORMAL LOW (ref 36.0–46.0)
Hemoglobin: 9.8 g/dL — ABNORMAL LOW (ref 12.0–15.0)
MCH: 29 pg (ref 26.0–34.0)
MCHC: 30.7 g/dL (ref 30.0–36.0)
MCV: 94.4 fL (ref 80.0–100.0)
Platelets: 139 10*3/uL — ABNORMAL LOW (ref 150–400)
RBC: 3.38 MIL/uL — ABNORMAL LOW (ref 3.87–5.11)
RDW: 16.5 % — ABNORMAL HIGH (ref 11.5–15.5)
WBC: 6.1 10*3/uL (ref 4.0–10.5)
nRBC: 0 % (ref 0.0–0.2)

## 2022-12-10 LAB — MAGNESIUM: Magnesium: 2.3 mg/dL (ref 1.7–2.4)

## 2022-12-10 LAB — CBG MONITORING, ED: Glucose-Capillary: 112 mg/dL — ABNORMAL HIGH (ref 70–99)

## 2022-12-10 MED ORDER — MIDODRINE HCL 5 MG PO TABS: 10.0000 mg | ORAL_TABLET | Freq: Three times a day (TID) | ORAL | Status: DC

## 2022-12-10 MED ORDER — METHOCARBAMOL 500 MG PO TABS
500.0000 mg | ORAL_TABLET | Freq: Three times a day (TID) | ORAL | Status: AC | PRN
Start: 2022-12-10 — End: 2022-12-11
  Administered 2022-12-10 – 2022-12-11 (×2): 500 mg via ORAL
  Filled 2022-12-10 (×2): qty 1

## 2022-12-10 MED ORDER — FAMOTIDINE 20 MG PO TABS
20.0000 mg | ORAL_TABLET | Freq: Every day | ORAL | Status: DC
  Administered 2022-12-11 – 2022-12-16 (×6): 20 mg via ORAL
  Filled 2022-12-10 (×6): qty 1

## 2022-12-10 MED ORDER — FOLIC ACID 1 MG PO TABS
1.0000 mg | ORAL_TABLET | Freq: Every day | ORAL | Status: DC
  Administered 2022-12-10 – 2022-12-16 (×7): 1 mg via ORAL
  Filled 2022-12-10 (×7): qty 1

## 2022-12-10 MED ORDER — VITAMIN B-12 1000 MCG PO TABS
2000.0000 ug | ORAL_TABLET | ORAL | Status: DC
  Administered 2022-12-16: 2000 ug via ORAL
  Filled 2022-12-10: qty 2

## 2022-12-10 MED ORDER — NYSTATIN 100000 UNIT/GM EX POWD
1.0000 | Freq: Three times a day (TID) | CUTANEOUS | Status: DC | PRN
  Administered 2022-12-10 – 2022-12-16 (×2): 1 via TOPICAL
  Filled 2022-12-10: qty 15

## 2022-12-10 MED ORDER — LORATADINE 10 MG PO TABS: 10.0000 mg | ORAL_TABLET | Freq: Every day | ORAL | Status: DC | PRN

## 2022-12-10 MED ORDER — LACTULOSE 10 GM/15ML PO SOLN
20.0000 g | Freq: Once | ORAL | Status: AC
  Administered 2022-12-10: 20 g via ORAL
  Filled 2022-12-10: qty 30

## 2022-12-10 MED ORDER — TAMSULOSIN HCL 0.4 MG PO CAPS
0.4000 mg | ORAL_CAPSULE | Freq: Every day | ORAL | Status: DC
  Administered 2022-12-10 – 2022-12-16 (×7): 0.4 mg via ORAL
  Filled 2022-12-10 (×7): qty 1

## 2022-12-10 MED ORDER — LEVOTHYROXINE SODIUM 75 MCG PO TABS
150.0000 ug | ORAL_TABLET | Freq: Every day | ORAL | Status: DC
  Administered 2022-12-11 – 2022-12-17 (×7): 150 ug via ORAL
  Filled 2022-12-10 (×7): qty 2

## 2022-12-10 MED ORDER — ACETAMINOPHEN 325 MG PO TABS
650.0000 mg | ORAL_TABLET | Freq: Four times a day (QID) | ORAL | Status: DC | PRN
  Administered 2022-12-11 – 2022-12-15 (×6): 650 mg via ORAL
  Filled 2022-12-10 (×6): qty 2

## 2022-12-10 MED ORDER — LIDOCAINE 5 % EX PTCH
1.0000 | MEDICATED_PATCH | Freq: Every day | CUTANEOUS | Status: DC | PRN
Start: 2022-12-10 — End: 2022-12-17
  Administered 2022-12-10 – 2022-12-15 (×5): 1 via TRANSDERMAL
  Filled 2022-12-10 (×7): qty 1

## 2022-12-10 MED ORDER — SODIUM CHLORIDE 0.9 % IV BOLUS
1000.0000 mL | Freq: Once | INTRAVENOUS | Status: AC
  Administered 2022-12-10: 1000 mL via INTRAVENOUS

## 2022-12-10 MED ORDER — RIFAXIMIN 550 MG PO TABS
550.0000 mg | ORAL_TABLET | Freq: Two times a day (BID) | ORAL | Status: DC
  Administered 2022-12-10 – 2022-12-16 (×13): 550 mg via ORAL
  Filled 2022-12-10 (×13): qty 1

## 2022-12-10 MED ORDER — LACTULOSE 10 GM/15ML PO SOLN
20.0000 g | Freq: Three times a day (TID) | ORAL | Status: DC
  Administered 2022-12-10 – 2022-12-16 (×19): 20 g via ORAL
  Filled 2022-12-10 (×19): qty 30

## 2022-12-10 MED ORDER — VENLAFAXINE HCL ER 75 MG PO CP24
75.0000 mg | ORAL_CAPSULE | Freq: Every day | ORAL | Status: DC
  Administered 2022-12-11 – 2022-12-16 (×6): 75 mg via ORAL
  Filled 2022-12-10 (×6): qty 1

## 2022-12-10 MED ORDER — PROCHLORPERAZINE EDISYLATE 10 MG/2ML IJ SOLN: 10.0000 mg | Freq: Four times a day (QID) | INTRAMUSCULAR | Status: DC | PRN

## 2022-12-10 MED ORDER — ROPINIROLE HCL 0.25 MG PO TABS
0.2500 mg | ORAL_TABLET | Freq: Every day | ORAL | Status: DC
  Administered 2022-12-10 – 2022-12-16 (×7): 0.25 mg via ORAL
  Filled 2022-12-10 (×7): qty 1

## 2022-12-10 MED ORDER — POTASSIUM CHLORIDE CRYS ER 20 MEQ PO TBCR
20.0000 meq | EXTENDED_RELEASE_TABLET | Freq: Two times a day (BID) | ORAL | Status: DC
  Administered 2022-12-10 – 2022-12-15 (×11): 20 meq via ORAL
  Filled 2022-12-10: qty 2
  Filled 2022-12-10 (×2): qty 1
  Filled 2022-12-10: qty 2
  Filled 2022-12-10: qty 1
  Filled 2022-12-10 (×3): qty 2
  Filled 2022-12-10: qty 1
  Filled 2022-12-10 (×8): qty 2
  Filled 2022-12-10: qty 1

## 2022-12-10 MED ORDER — HYDROCORTISONE 1 % EX CREA
1.0000 | TOPICAL_CREAM | Freq: Three times a day (TID) | CUTANEOUS | Status: DC | PRN
  Administered 2022-12-10 – 2022-12-11 (×2): 1 via TOPICAL
  Filled 2022-12-10: qty 28

## 2022-12-10 MED ORDER — ACETAMINOPHEN 650 MG RE SUPP: 650.0000 mg | Freq: Four times a day (QID) | RECTAL | Status: DC | PRN

## 2022-12-10 MED ORDER — ALBUMIN HUMAN 25 % IV SOLN
50.0000 g | Freq: Once | INTRAVENOUS | Status: AC
  Administered 2022-12-10: 50 g via INTRAVENOUS
  Filled 2022-12-10: qty 200

## 2022-12-10 MED ORDER — MEGESTROL ACETATE 40 MG PO TABS
40.0000 mg | ORAL_TABLET | Freq: Two times a day (BID) | ORAL | Status: DC
  Administered 2022-12-10 – 2022-12-16 (×13): 40 mg via ORAL
  Filled 2022-12-10 (×15): qty 1

## 2022-12-10 MED ORDER — PANTOPRAZOLE SODIUM 40 MG PO TBEC
40.0000 mg | DELAYED_RELEASE_TABLET | Freq: Two times a day (BID) | ORAL | Status: DC
  Administered 2022-12-11 – 2022-12-16 (×12): 40 mg via ORAL
  Filled 2022-12-10 (×12): qty 1

## 2022-12-10 NOTE — ED Notes (Signed)
ED TO INPATIENT HANDOFF REPORT  ED Nurse Name and Phone #: Marygrace Drought RN  S Name/Age/Gender [name] 67 y.o. female Room/Bed: WA17/WA17  Code Status   Code Status: Limited: Do not attempt resuscitation (DNR) -DNR-LIMITED -Do Not Intubate/DNI   Home/SNF/Other Home Patient oriented to: self and place Is this baseline? No   Triage Complete: Triage complete  Chief Complaint Hepatic encephalopathy [facility]) [K76.82]  Triage Note Patient BIB son. Reports starting yesterday she has been becoming more confused and "out of it." Was recently admitted for hepatic encephalopathy. Has been taking her lactulose.  Patient is drowsy, A&Ox2   Allergies Allergies  Allergen Reactions   Ace Inhibitors Anaphylaxis and Swelling   Ivp Dye [Iodinated Contrast Media] Anaphylaxis   Desitin [Zinc Oxide] Rash and Other (See Comments)    Worsens rash   Gadolinium Derivatives Other (See Comments)    Reaction??   Hydroxyzine Other (See Comments)    Affects the mind   Chlorhexidine Rash and Other (See Comments)    WORSENS RASHES   Doxycycline Rash   Penicillins Rash   Zofran [Ondansetron] Rash    Level of Care/Admitting Diagnosis ED Disposition     ED Disposition  Admit   Condition  --   Comment  Hospital Area: [facility] [[id number]]  Level of Care: Progressive [102]  Admit to Progressive based on following criteria: MULTISYSTEM THREATS such as stable sepsis, metabolic/electrolyte imbalance with or without encephalopathy that is responding to early treatment.  May place patient in observation at [facility] or [facility] if equivalent level of care is available:: No  Covid Evaluation: Asymptomatic - no recent exposure (last 10 days) testing not required  Diagnosis: Hepatic encephalopathy (HCC) [572.2.ICD-9-CM]  Admitting Physician: Bobette Mo [[id number]]  Attending Physician: Bobette Mo [[id number]]          B Medical/Surgery History Past Medical  History:  Diagnosis Date   Abdominal wall hernia 03/06/2020   Cerebral atherosclerosis 08/20/2020   Chronic diarrhea    Cirrhosis, 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   pt stated on admission history review   Corneal perforation of left eye 04/18/2022   Depression, major, recurrent, moderate (HCC) 09/10/2018   DVT (deep venous thrombosis) (HCC) 01/10/1997   left      left     Heart murmur    Hyperammonemia (HCC) 09/30/2022   Hypertension    Kidney stone    Obesity    Pulmonary emboli (HCC) 01/10/2009   Pulmonary embolism (HCC)    Short gut syndrome    Thyroid disease    Past Surgical History:  Procedure Laterality Date   CESAREAN SECTION WITH BILATERAL TUBAL LIGATION     CHOLECYSTECTOMY     COLON SURGERY     ESOPHAGOGASTRODUODENOSCOPY (EGD) WITH PROPOFOL N/A 10/19/2022   Procedure: ESOPHAGOGASTRODUODENOSCOPY (EGD) WITH PROPOFOL;  Surgeon: Kerin Salen, MD;  Location: [facility];  Service: Gastroenterology;  Laterality: N/A;   HERNIA REPAIR     ILEOSTOMY     ILEOSTOMY CLOSURE     IR KYPHO THORACIC WITH BONE BIOPSY  08/31/2022   IR KYPHO THORACIC WITH BONE BIOPSY  10/27/2022   IR RADIOLOGIST EVAL & MGMT  09/27/2022   KIDNEY STONE SURGERY     WRIST SURGERY       A IV Location/Drains/Wounds Patient Lines/Drains/Airways Status     Active Line/Drains/Airways     Name Placement date Placement time Site Days   Peripheral IV 12/10/22 20 G Left;Posterior Hand 12/10/22  1145  Hand  less  than 1   Wound / Incision (Open or Dehisced) 05/02/22 (ITD) Intertriginous Dermatitis Breast Left;Lower 05/02/22  0200  Breast  222   Wound / Incision (Open or Dehisced) 05/02/22 (ITD) Intertriginous Dermatitis Groin Bilateral 05/02/22  0200  Groin  222   Wound / Incision (Open or Dehisced) 05/02/22 (ITD) Intertriginous Dermatitis Knee Posterior;Bilateral 05/02/22  0200  Knee  222   Wound / Incision (Open or Dehisced) 10/18/22 Non-pressure wound;Irritant Dermatitis (Moisture Associated Skin  Damage);Other (Comment) Coccyx Mid;Upper 10/18/22  2000  Coccyx  53            Intake/Output Last 24 hours No intake or output data in the 24 hours ending 12/10/22 1445  Labs/Imaging Results for orders placed or performed during the hospital encounter of 12/10/22 (from the past 48 hour(s))  Urinalysis, Routine w reflex microscopic -Urine, Clean Catch     Status: Abnormal   Collection Time: 12/10/22 10:45 AM  Result Value Ref Range   Color, Urine YELLOW YELLOW   APPearance CLOUDY (A) CLEAR   Specific Gravity, Urine 1.020 1.005 - 1.030   pH 5.0 5.0 - 8.0   Glucose, UA NEGATIVE NEGATIVE mg/dL   Hgb urine dipstick MODERATE (A) NEGATIVE   Bilirubin Urine NEGATIVE NEGATIVE   Ketones, ur NEGATIVE NEGATIVE mg/dL   Protein, ur NEGATIVE NEGATIVE mg/dL   Nitrite NEGATIVE NEGATIVE   Leukocytes,Ua NEGATIVE NEGATIVE   RBC / HPF 6-10 0 - 5 RBC/hpf   WBC, UA 0-5 0 - 5 WBC/hpf   Bacteria, UA RARE (A) NONE SEEN   Squamous Epithelial / HPF 11-20 0 - 5 /HPF   Mucus PRESENT    Hyaline Casts, UA PRESENT     Comment: Performed at [facility], [address], [place], [place] [address]  POC CBG, ED     Status: Abnormal   Collection Time: 12/10/22 10:49 AM  Result Value Ref Range   Glucose-Capillary 112 (H) 70 - 99 mg/dL    Comment: Glucose reference range applies only to samples taken after fasting for at least 8 hours.  CBC     Status: Abnormal   Collection Time: 12/10/22 11:48 AM  Result Value Ref Range   WBC 6.1 4.0 - 10.5 K/uL   RBC 3.38 (L) 3.87 - 5.11 MIL/uL   Hemoglobin 9.8 (L) 12.0 - 15.0 g/dL   HCT 62.1 (L) 30.8 - 65.7 %   MCV 94.4 80.0 - 100.0 fL   MCH 29.0 26.0 - 34.0 pg   MCHC 30.7 30.0 - 36.0 g/dL   RDW 84.6 (H) 96.2 - 95.2 %   Platelets 139 (L) 150 - 400 K/uL   nRBC 0.0 0.0 - 0.2 %    Comment: Performed at [facility], [address] [place] [address], [place], [place] [address]  Comprehensive metabolic panel     Status: Abnormal   Collection Time:  12/10/22 11:48 AM  Result Value Ref Range   Sodium 138 135 - 145 mmol/L   Potassium 4.0 3.5 - 5.1 mmol/L   [place] 109 98 - 111 mmol/L   CO2 21 (L) 22 - 32 mmol/L   Glucose, Bld 112 (H) 70 - 99 mg/dL    Comment: Glucose reference range applies only to samples taken after fasting for at least 8 hours.   BUN 28 (H) 8 - 23 mg/dL   Creatinine, Ser 4.40 (H) 0.44 - 1.00 mg/dL   Calcium 8.8 (L) 8.9 - 10.3 mg/dL   Total Protein 6.2 (L) 6.5 - 8.1 g/dL  Albumin 2.7 (L) 3.5 - 5.0 g/dL   AST 37 15 - 41 U/L   ALT 22 0 - 44 U/L   Alkaline Phosphatase 96 38 - 126 U/L   Total Bilirubin 1.4 (H) <1.2 mg/dL   GFR, Estimated 31 (L) >60 mL/min    Comment: (NOTE) Calculated using the CKD-EPI Creatinine Equation (2021)    Anion gap 8 5 - 15    Comment: Performed at [facility], [address], [place], [place] [address]  Ammonia     Status: Abnormal   Collection Time: 12/10/22 11:48 AM  Result Value Ref Range   Ammonia 159 (H) 9 - 35 umol/L    Comment: Performed at [facility], [address], [place], [place] [address]  Lipase, blood     Status: Abnormal   Collection Time: 12/10/22 11:48 AM  Result Value Ref Range   Lipase 53 (H) 11 - 51 U/L    Comment: Performed at [facility], [address], [place], [place] [address]  Protime-INR     Status: Abnormal   Collection Time: 12/10/22 11:48 AM  Result Value Ref Range   Prothrombin Time 19.0 (H) 11.4 - 15.2 seconds   INR 1.6 (H) 0.8 - 1.2    Comment: (NOTE) INR goal varies based on device and disease states. Performed at [facility], [address] [place], [place] [address]   Magnesium     Status: None   Collection Time: 12/10/22 11:48 AM  Result Value Ref Range   Magnesium 2.3 1.7 - 2.4 mg/dL    Comment: Performed at [facility], [address] [place], [place] [address]   No results found.  Pending Labs Unresulted Labs (From admission,  onward)     Start     Ordered   12/11/22 0500  Ammonia  Daily,   R      12/10/22 1420   12/11/22 0500  Comprehensive metabolic panel  Tomorrow morning,   R        12/10/22 1420   12/11/22 0500  CBC  Tomorrow morning,   R        12/10/22 1423            Vitals/Pain Today's Vitals   12/10/22 1230 12/10/22 1300 12/10/22 1330 12/10/22 1436  BP: (!) 140/69 (!) 144/75 (!) 144/72   Pulse: 68 71 71   Resp: 16 19 18    Temp:    98.5 F (36.9 C)  TempSrc:    Oral  SpO2: 99% 99% 99%   Weight:      Height:      PainSc:        Isolation Precautions No active isolations  Medications Medications  acetaminophen (TYLENOL) tablet 650 mg (has no administration in time range)    Or  acetaminophen (TYLENOL) suppository 650 mg (has no administration in time range)  prochlorperazine (COMPAZINE) injection 10 mg (has no administration in time range)  lactulose (CHRONULAC) 10 GM/15ML solution 20 g (20 g Oral Given 12/10/22 1414)  sodium chloride 0.9 % bolus 1,000 mL (1,000 mLs Intravenous New Bag/Given 12/10/22 1415)    Mobility manual wheelchair     Focused Assessments Cardiac Assessment Handoff:  Cardiac Rhythm: Normal sinus rhythm No results found for: "CKTOTAL", "CKMB", "CKMBINDEX", "TROPONINI" Lab Results  Component Value Date   DDIMER 2.58 (H) 08/14/2022   Does the Patient currently have chest pain? No    R Recommendations: See Admitting Provider Note  Report given to:   Additional Notes:

## 2022-12-10 NOTE — Plan of Care (Signed)
  Problem: Health Behavior/Discharge Planning: Goal: Ability to manage health-related needs will improve Outcome: Progressing   Problem: Activity: Goal: Risk for activity intolerance will decrease Outcome: Progressing   Problem: Nutrition: Goal: Adequate nutrition will be maintained Outcome: Progressing   Problem: Coping: Goal: Level of anxiety will decrease Outcome: Progressing   Problem: Safety: Goal: Ability to remain free from injury will improve Outcome: Progressing

## 2022-12-10 NOTE — H&P (Signed)
History and Physical    Patient: [name] ZOX:[id number] DOB: Oct 30, 1955 DOA: 12/10/2022 DOS: the patient was seen and examined on 12/10/2022 PCP: [name], MD  Patient coming from: Home  Chief Complaint:  Chief Complaint  Patient presents with   Altered Mental Status   HPI: [name] is a 67 y.o. female with medical history significant of abdominal wall hernia, chronic diarrhea, cerebral atherosclerosis, depression, former smoker, folate deficiency, history of angioedema, history of DVT, hyperuricemia, history of MDR Pseudomonas aeruginosa infection hypokalemia, hypertension, splenomegaly, left eye corneal perforation, left eye leukoma, nonalcoholic liver cirrhosis, heart murmur, hypertension, cholelithiasis, class III obesity, pulmonary embolism, short gut syndrome, hypothyroidism, history of DVT, vaginal bleeding, vitamin B12 deficiency who was recently admitted from 11/30/2022 until 12/05/2022 due to acute hepatic encephalopathy who is returning to the emergency department by her son due to recurrence of confusion since yesterday..  She is only oriented to name, knows she is in the hospital, but she thought that it was [facility].  She is unable to provide any further information about her HPI.  Lab work: Urinalysis was cloudy with moderate hemoglobin and rare bacteria on microscopic examination.  CBC showed a white count of 6.1, hemoglobin 9.8 g/dL and platelets 811.  PT 19.0 seconds and INR 1.6.  Lipase 53 units/L and ammonia 159 mol/L.  CMP showed a CO2 of 21 mmol/L with a normal anion gap, the rest of the electrolytes were normal after calcium correction.  Glucose 112, total bilirubin 1.4, BUN 28 and creatinine 1.81 mg/dL.  Total protein 6.2 and albumin 2.7 g/dL.  Transaminases and alkaline phosphatase were normal.   ED course: Initial vital signs were temperature 97.8 F, pulse 92, respiration 20, BP 148/89 mmHg O2 sat 100% on room air.  The  patient received 20 g of lactulose and 1000 mL of normal saline bolus.  Review of Systems: As mentioned in the history of present illness. All other systems reviewed and are negative. Past Medical History:  Diagnosis Date   Chronic diarrhea    Cirrhosis, 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   pt stated on admission history review   DVT (deep venous thrombosis) (HCC) 01/10/1997   left      left     Heart murmur    Hyperammonemia (HCC) 09/30/2022   Hypertension    Kidney stone    Obesity    Pulmonary emboli (HCC) 01/10/2009   Pulmonary embolism (HCC)    Short gut syndrome    Thyroid disease    Past Surgical History:  Procedure Laterality Date   CESAREAN SECTION WITH BILATERAL TUBAL LIGATION     CHOLECYSTECTOMY     COLON SURGERY     ESOPHAGOGASTRODUODENOSCOPY (EGD) WITH PROPOFOL N/A 10/19/2022   Procedure: ESOPHAGOGASTRODUODENOSCOPY (EGD) WITH PROPOFOL;  Surgeon: Kerin Salen, MD;  Location: [facility];  Service: Gastroenterology;  Laterality: N/A;   HERNIA REPAIR     ILEOSTOMY     ILEOSTOMY CLOSURE     IR KYPHO THORACIC WITH BONE BIOPSY  08/31/2022   IR KYPHO THORACIC WITH BONE BIOPSY  10/27/2022   IR RADIOLOGIST EVAL & MGMT  09/27/2022   KIDNEY STONE SURGERY     WRIST SURGERY     Social History:  reports that she quit smoking about 37 years ago. Her smoking use included cigarettes. She has been exposed to tobacco smoke. She has never used smokeless tobacco. She reports that she does not drink alcohol and does not use drugs.  Allergies  Allergen Reactions  Ace Inhibitors Anaphylaxis and Swelling   Ivp Dye [Iodinated Contrast Media] Anaphylaxis   Desitin [Zinc Oxide] Rash and Other (See Comments)    Worsens rash   Gadolinium Derivatives Other (See Comments)    Reaction??   Hydroxyzine Other (See Comments)    Affects the mind   Chlorhexidine Rash and Other (See Comments)    WORSENS RASHES   Doxycycline Rash   Penicillins Rash   Zofran [Ondansetron] Rash    History  reviewed. No pertinent family history.  Prior to Admission medications   Medication Sig Start Date End Date Taking? Authorizing Provider  atorvastatin (LIPITOR) 40 MG tablet Take 40 mg by mouth in the morning. 01/19/22  Yes [provider]  colchicine 0.6 MG tablet Take 0.6 mg by mouth daily as needed (gout flares).   Yes [provider]  cyanocobalamin (VITAMIN B12) 1000 MCG tablet Take 2,000 mcg by mouth every Friday.   Yes [provider]  dicyclomine (BENTYL) 10 MG capsule Take 1 capsule (10 mg total) by mouth 3 (three) times daily as needed for spasms (abdominal cramping). 09/09/22  Yes Lewie Chamber, MD  EPINEPHrine 0.3 mg/0.3 mL IJ SOAJ injection Inject 0.3 mg into the muscle as needed for anaphylaxis. 08/07/19  Yes [provider]  famotidine (PEPCID) 20 MG tablet Take 20 mg by mouth in the morning.   Yes [provider]  folic acid (FOLVITE) 1 MG tablet Take 1 tablet (1 mg total) by mouth daily. 06/16/22  Yes Calvert Cantor, MD  furosemide (LASIX) 40 MG tablet Take 1 tablet (40 mg total) by mouth daily. Take additional 40 mg if there is a weight gain of 3 lbs in1 day or 5 lbs in 1 week. Patient taking differently: Take 40 mg by mouth 2 (two) times daily. 10/31/22  Yes Rolly Salter, MD  IRON-VITAMIN C PO Take 1 tablet by mouth daily with breakfast.   Yes [provider]  lactulose (CHRONULAC) 10 GM/15ML solution Take 30 mLs (20 g total) by mouth 3 (three) times daily. Take to ensure that there is 2 loose bowel movement EVERY DAY Patient taking differently: Take 30 mLs by mouth 3 (three) times daily as needed (to ensure there are 2 loose bowel movements EVERY DAY). 10/31/22  Yes Rolly Salter, MD  levothyroxine (SYNTHROID) 150 MCG tablet Take 150 mcg by mouth daily before breakfast. 10/26/21  Yes [provider]  lidocaine (LIDODERM) 5 % Place 1 patch onto the skin daily. Remove & Discard patch within 12 hours or as directed by  MD Patient taking differently: Place 1 patch onto the skin daily as needed (for pain- Remove & Discard patch within 12 hours or as directed by MD). 10/31/22  Yes Rolly Salter, MD  megestrol (MEGACE) 40 MG tablet Take 1 tablet (40 mg total) by mouth 2 (two) times daily. 11/30/22  Yes Lorriane Shire, MD  methocarbamol (ROBAXIN) 500 MG tablet Take 500 mg by mouth in the morning. 03/04/22  Yes [provider]  midodrine (PROAMATINE) 10 MG tablet Take 1 tablet (10 mg total) by mouth 3 (three) times daily with meals. 09/09/22  Yes Lewie Chamber, MD  naloxone [facility]) nasal spray 4 mg/0.1 mL Place 1 spray into the nose as needed (accidental overdose). 03/25/22  Yes [provider]  nystatin cream (MYCOSTATIN) Apply topically 2 (two) times daily. Patient taking differently: Apply 1 Application topically 2 (two) times daily as needed for dry skin (or irritation). 05/22/22  Yes Gherghe, Costin M,  MD  nystatin powder Apply 1 Application topically 3 (three) times daily as needed (for irritation- affected areas).   Yes [provider]  pantoprazole (PROTONIX) 40 MG tablet Take 1 tablet (40 mg total) by mouth 2 (two) times daily before a meal. 09/09/22  Yes Lewie Chamber, MD  potassium chloride (KLOR-CON) 10 MEQ tablet Take 2 tablets (20 mEq total) by mouth in the morning and at bedtime for 30 doses. 12/05/22 12/20/22 Yes Lanae Boast, MD  promethazine (PHENERGAN) 12.5 MG tablet Take 12.5 mg by mouth every 6 (six) hours as needed for nausea or vomiting.   Yes [provider]  rifaximin (XIFAXAN) 550 MG TABS tablet Take 550 mg by mouth 2 (two) times daily.   Yes [provider]  rOPINIRole (REQUIP) 0.25 MG tablet Take 1 tablet (0.25 mg total) by mouth at bedtime. Patient taking differently: Take 0.25 mg by mouth daily. 09/09/22  Yes Lewie Chamber, MD  spironolactone (ALDACTONE) 25 MG tablet Take 1 tablet (25 mg total) by mouth daily. 09/10/22  Yes Lewie Chamber, MD   tamsulosin (FLOMAX) 0.4 MG CAPS capsule Take 1 capsule (0.4 mg total) by mouth daily. 09/10/22  Yes Lewie Chamber, MD  triamcinolone cream (KENALOG) 0.5 % Apply to affected area of right breast twice daily as directed. Patient taking differently: Apply 1 Application topically 2 (two) times daily as needed (for irritation- right breast). 05/10/22  Yes Osvaldo Shipper, MD  TYLENOL 500 MG tablet Take 1,000 mg by mouth every 6 (six) hours as needed for mild pain (pain score 1-3) or headache.   Yes [provider]  venlafaxine XR (EFFEXOR-XR) 75 MG 24 hr capsule Take 75 mg by mouth daily with breakfast. 08/17/21  Yes [provider]  Vitamin D, Ergocalciferol, (DRISDOL) 50000 UNITS CAPS Take 50,000 Units by mouth every Wednesday.   Yes [provider]    Physical Exam: Vitals:   12/10/22 1200 12/10/22 1230 12/10/22 1300 12/10/22 1330  BP: (!) 158/69 (!) 140/69 (!) 144/75 (!) 144/72  Pulse: 78 68 71 71  Resp: 18 16 19 18   Temp:      TempSrc:      SpO2: 100% 99% 99% 99%  Weight:      Height:       Physical Exam Vitals and nursing note reviewed.  Constitutional:      General: She is awake. She is not in acute distress.    Appearance: She is obese. She is ill-appearing.  HENT:     Head: Normocephalic.     Nose: No rhinorrhea.     Mouth/Throat:     Mouth: Mucous membranes are dry.  Eyes:     Pupils: Pupils are equal, round, and reactive to light.  Neck:     Vascular: No JVD.  Cardiovascular:     Rate and Rhythm: Normal rate and regular rhythm.     Heart sounds: S1 normal and S2 normal.  Pulmonary:     Effort: Pulmonary effort is normal.     Breath sounds: Normal breath sounds. No wheezing, rhonchi or rales.  Abdominal:     General: Bowel sounds are normal. There is no distension.     Palpations: Abdomen is soft.     Tenderness: There is abdominal tenderness. There is no guarding.  Musculoskeletal:     Cervical back: Neck supple.     Right lower leg: No  edema.     Left lower leg: No edema.     Comments: Moderate generalized weakness.  Able to sit up by herself in the ED stretcher with some difficulty.  Skin:    General: Skin is warm and dry.  Neurological:     General: No focal deficit present.     Mental Status: She is alert. She is disoriented.  Psychiatric:        Mood and Affect: Mood normal.        Behavior: Behavior normal. Behavior is cooperative.     Data Reviewed:  Results are pending, will review when available. 06/13/2022 transthoracic echocardiogram. IMPRESSIONS:   1. Left ventricular ejection fraction, by estimation, is 60 to 65%. The  left ventricle has normal function. The left ventricle has no regional  wall motion abnormalities. There is mild asymmetric left ventricular  hypertrophy of the basal-septal segment.  Left ventricular diastolic parameters are consistent with Grade I  diastolic dysfunction (impaired relaxation).   2. Right ventricular systolic function is normal. The right ventricular  size is normal.   3. The mitral valve is normal in structure. Trivial mitral valve  regurgitation. No evidence of mitral stenosis.   4. The aortic valve is tricuspid. There is moderate calcification of the  aortic valve. Aortic valve regurgitation is not visualized. Moderate  aortic valve stenosis.   EKG: Vent. rate 97 BPM PR interval 200 ms QRS duration 82 ms QT/QTcB 384/488 ms P-R-T axes 83 -11 42 Sinus rhythm Inferior infarct, old Anterior infarct, old  Assessment and Plan: Principal Problem:   Acute hepatic encephalopathy (HCC) In the setting of:   Cirrhosis due to NASH Observation/PCU. Frequent neurochecks. Continue lactulose 20 g 3 times daily. Follow-up ammonia level in AM. Continue with rifaximin 550 mg p.o. twice daily. Holding diuretics due to AKI. Has generalized weakness. -Consider PT/OT consult no improvement.  Active Problems:   AKI (acute kidney injury)  (HCC) Observation/telemetry. Continue IV fluids. Hold diuretics. Avoid hypotension. Avoid nephrotoxins. Monitor intake and output. Monitor renal function/electrolytes.    Grade I diastolic dysfunction  Holding diuretics. Currently volume depleted.    Iron deficiency anemia Monitor hematocrit and hemoglobin. Transfuse as needed.    Thrombocytopenia (HCC) In the setting of liver cirrhosis. Monitor platelet count.    GERD (gastroesophageal reflux disease) Continue pantoprazole 40 mg p.o. twice daily.    Hypothyroidism Continue levothyroxine 150 mcg p.o. daily.    Morbid obesity (HCC) Current BMI 40.36 kg/m. Lifestyle modifications. Follow-up with closely with PCP.    Folate deficiency Continue folic acid 1 mg p.o. daily.    Depression, major, recurrent, moderate (HCC) Continue venlafaxine 75 mg p.o. daily.      Advance Care Planning:   Code Status: Limited: Do not attempt resuscitation (DNR) -DNR-LIMITED -Do Not Intubate/DNI    Consults:   Family Communication:   Severity of Illness: The appropriate patient status for this patient is OBSERVATION. Observation status is judged to be reasonable and necessary in order to provide the required intensity of service to ensure the patient's safety. The patient's presenting symptoms, physical exam findings, and initial radiographic and laboratory data in the context of their medical condition is felt to place them at decreased risk for further clinical deterioration. Furthermore, it is anticipated that the patient will be medically stable for discharge from the hospital within 2 midnights of admission.   Author: Bobette Mo, MD 12/10/2022 2:10 PM  For on call review [email or web address].   This document was prepared using Dragon voice recognition software and may contain some unintended transcription errors.

## 2022-12-10 NOTE — Plan of Care (Signed)
  Problem: Education: Goal: Knowledge of General Education information will improve Description: Including pain rating scale, medication(s)/side effects and non-pharmacologic comfort measures Outcome: Progressing   Problem: Health Behavior/Discharge Planning: Goal: Ability to manage health-related needs will improve Outcome: Progressing   Problem: Clinical Measurements: Goal: Ability to maintain clinical measurements within normal limits will improve Outcome: Progressing Goal: Will remain free from infection Outcome: Progressing Goal: Diagnostic test results will improve Outcome: Progressing   Problem: Activity: Goal: Risk for activity intolerance will decrease Outcome: Progressing   Problem: Nutrition: Goal: Adequate nutrition will be maintained Outcome: Progressing   Problem: Coping: Goal: Level of anxiety will decrease Outcome: Progressing   Problem: Elimination: Goal: Will not experience complications related to bowel motility Outcome: Progressing   Problem: Pain Management: Goal: General experience of comfort will improve Outcome: Progressing   Problem: Safety: Goal: Ability to remain free from injury will improve Outcome: Progressing   Problem: Skin Integrity: Goal: Risk for impaired skin integrity will decrease Outcome: Progressing   Problem: Clinical Measurements: Goal: Respiratory complications will improve Outcome: Adequate for Discharge Goal: Cardiovascular complication will be avoided Outcome: Adequate for Discharge

## 2022-12-10 NOTE — ED Provider Notes (Signed)
[place] EMERGENCY DEPARTMENT AT [facility] Provider Note   CSN: [id number] Arrival date & time: 12/10/22  1033     History  Chief Complaint  Patient presents with   Altered Mental Status    [name] is a 67 y.o. female.  Patient is a 67 year old female with past medical history of nonalcoholic liver cirrhosis senting for altered mental status.  Patient states that patient normally takes lactulose 30 mL in the morning and 10 mL as needed in the evening if patient does not have 4-5 bowel movements.  He states that yesterday she had multiple doctor appointments and so she did not get her 30 mL of lactulose in the morning.  He states in the evening she got her lactulose and continued to have bowel movements throughout the day.  He states yesterday she was normal and this morning she woke up altered and confused.  He denies any infectious signs or symptoms including no fevers or chills, no coughing, vomiting, abdominal complaints, or diarrhea.  He states she is not currently complaining of dysuria or increased frequency however she had has had frequent urinary tract infections in the past.  Denies any recent falls or head trauma.  Patient's son states that an ammonia level of 60 is good for her.  The history is provided by the patient. No language interpreter was used.  Altered Mental Status Presenting symptoms: confusion   Associated symptoms: no abdominal pain, no fever, no palpitations, no rash, no seizures and no vomiting        Home Medications Prior to Admission medications   Medication Sig Start Date End Date Taking? Authorizing Provider  atorvastatin (LIPITOR) 40 MG tablet Take 40 mg by mouth in the morning. 01/19/22  Yes [provider]  colchicine 0.6 MG tablet Take 0.6 mg by mouth daily as needed (gout flares).   Yes [provider]  cyanocobalamin (VITAMIN B12) 1000 MCG tablet Take 2,000 mcg by mouth every Friday.   Yes [provider]  dicyclomine (BENTYL) 10 MG capsule Take 1 capsule (10 mg total) by mouth 3 (three) times daily as needed for spasms (abdominal cramping). 09/09/22  Yes Lewie Chamber, MD  EPINEPHrine 0.3 mg/0.3 mL IJ SOAJ injection Inject 0.3 mg into the muscle as needed for anaphylaxis. 08/07/19  Yes [provider]  famotidine (PEPCID) 20 MG tablet Take 20 mg by mouth in the morning.   Yes [provider]  folic acid (FOLVITE) 1 MG tablet Take 1 tablet (1 mg total) by mouth daily. 06/16/22  Yes Calvert Cantor, MD  furosemide (LASIX) 40 MG tablet Take 1 tablet (40 mg total) by mouth daily. Take additional 40 mg if there is a weight gain of 3 lbs in1 day or 5 lbs in 1 week. Patient taking differently: Take 40 mg by mouth 2 (two) times daily. 10/31/22  Yes Rolly Salter, MD  IRON-VITAMIN C PO Take 1 tablet by mouth daily with breakfast.   Yes [provider]  lactulose (CHRONULAC) 10 GM/15ML solution Take 30 mLs (20 g total) by mouth 3 (three) times daily. Take to ensure that there is 2 loose bowel movement EVERY DAY Patient taking differently: Take 30 mLs by mouth 3 (three) times daily as needed (to ensure there are 2 loose bowel movements EVERY DAY). 10/31/22  Yes Rolly Salter, MD  levothyroxine (SYNTHROID) 150 MCG tablet Take 150 mcg by mouth daily before breakfast. 10/26/21  Yes [provider]  lidocaine (LIDODERM) 5 %  Place 1 patch onto the skin daily. Remove & Discard patch within 12 hours or as directed by MD Patient taking differently: Place 1 patch onto the skin daily as needed (for pain- Remove & Discard patch within 12 hours or as directed by MD). 10/31/22  Yes Rolly Salter, MD  megestrol (MEGACE) 40 MG tablet Take 1 tablet (40 mg total) by mouth 2 (two) times daily. 11/30/22  Yes Lorriane Shire, MD  methocarbamol (ROBAXIN) 500 MG tablet Take 500 mg by mouth in the morning. 03/04/22  Yes [provider]  midodrine (PROAMATINE) 10 MG tablet  Take 1 tablet (10 mg total) by mouth 3 (three) times daily with meals. 09/09/22  Yes Lewie Chamber, MD  naloxone [facility]) nasal spray 4 mg/0.1 mL Place 1 spray into the nose as needed (accidental overdose). 03/25/22  Yes [provider]  nystatin cream (MYCOSTATIN) Apply topically 2 (two) times daily. Patient taking differently: Apply 1 Application topically 2 (two) times daily as needed for dry skin (or irritation). 05/22/22  Yes Leatha Gilding, MD  nystatin powder Apply 1 Application topically 3 (three) times daily as needed (for irritation- affected areas).   Yes [provider]  pantoprazole (PROTONIX) 40 MG tablet Take 1 tablet (40 mg total) by mouth 2 (two) times daily before a meal. 09/09/22  Yes Lewie Chamber, MD  potassium chloride (KLOR-CON) 10 MEQ tablet Take 2 tablets (20 mEq total) by mouth in the morning and at bedtime for 30 doses. 12/05/22 12/20/22 Yes Lanae Boast, MD  promethazine (PHENERGAN) 12.5 MG tablet Take 12.5 mg by mouth every 6 (six) hours as needed for nausea or vomiting.   Yes [provider]  rifaximin (XIFAXAN) 550 MG TABS tablet Take 550 mg by mouth 2 (two) times daily.   Yes [provider]  rOPINIRole (REQUIP) 0.25 MG tablet Take 1 tablet (0.25 mg total) by mouth at bedtime. Patient taking differently: Take 0.25 mg by mouth daily. 09/09/22  Yes Lewie Chamber, MD  spironolactone (ALDACTONE) 25 MG tablet Take 1 tablet (25 mg total) by mouth daily. 09/10/22  Yes Lewie Chamber, MD  tamsulosin (FLOMAX) 0.4 MG CAPS capsule Take 1 capsule (0.4 mg total) by mouth daily. 09/10/22  Yes Lewie Chamber, MD  triamcinolone cream (KENALOG) 0.5 % Apply to affected area of right breast twice daily as directed. Patient taking differently: Apply 1 Application topically 2 (two) times daily as needed (for irritation- right breast). 05/10/22  Yes Osvaldo Shipper, MD  TYLENOL 500 MG tablet Take 1,000 mg by mouth every 6 (six) hours as needed for mild pain  (pain score 1-3) or headache.   Yes [provider]  venlafaxine XR (EFFEXOR-XR) 75 MG 24 hr capsule Take 75 mg by mouth daily with breakfast. 08/17/21  Yes [provider]  Vitamin D, Ergocalciferol, (DRISDOL) 50000 UNITS CAPS Take 50,000 Units by mouth every Wednesday.   Yes [provider]      Allergies    Ace inhibitors, Ivp dye [iodinated contrast media], Desitin [zinc oxide], Gadolinium derivatives, Hydroxyzine, Chlorhexidine, Doxycycline, Penicillins, and Zofran [ondansetron]    Review of Systems   Review of Systems  Constitutional:  Negative for chills and fever.  HENT:  Negative for ear pain and sore throat.   Eyes:  Negative for pain and visual disturbance.  Respiratory:  Negative for cough and shortness of breath.   Cardiovascular:  Negative for chest pain and palpitations.  Gastrointestinal:  Negative for abdominal pain and vomiting.  Genitourinary:  Negative  for dysuria and hematuria.  Musculoskeletal:  Negative for arthralgias and back pain.  Skin:  Negative for color change and rash.  Neurological:  Negative for seizures and syncope.  Psychiatric/Behavioral:  Positive for confusion.   All other systems reviewed and are negative.   Physical Exam Updated Vital Signs BP (!) 144/72   Pulse 71   Temp 97.8 F (36.6 C) (Oral)   Resp 18   Ht 5\' 2"  (1.575 m)   Wt 100.1 kg   SpO2 99%   BMI 40.36 kg/m  Physical Exam Vitals and nursing note reviewed.  Constitutional:      General: She is not in acute distress.    Appearance: She is well-developed.  HENT:     Head: Normocephalic and atraumatic.  Eyes:     Conjunctiva/sclera: Conjunctivae normal.  Cardiovascular:     Rate and Rhythm: Normal rate and regular rhythm.     Heart sounds: No murmur heard. Pulmonary:     Effort: Pulmonary effort is normal. No respiratory distress.     Breath sounds: Normal breath sounds.  Abdominal:     Palpations: Abdomen is soft.     Tenderness: There is no  abdominal tenderness.  Musculoskeletal:        General: No swelling.     Cervical back: Neck supple.  Skin:    General: Skin is warm and dry.     Capillary Refill: Capillary refill takes less than 2 seconds.     Coloration: Skin is jaundiced.  Neurological:     Mental Status: She is alert. She is confused.     GCS: GCS eye subscore is 4. GCS verbal subscore is 5. GCS motor subscore is 5.  Psychiatric:        Mood and Affect: Mood normal.     ED Results / Procedures / Treatments   Labs (all labs ordered are listed, but only abnormal results are displayed) Labs Reviewed  URINALYSIS, ROUTINE W REFLEX MICROSCOPIC - Abnormal; Notable for the following components:      Result Value   APPearance CLOUDY (*)    Hgb urine dipstick MODERATE (*)    Bacteria, UA RARE (*)    All other components within normal limits  CBC - Abnormal; Notable for the following components:   RBC 3.38 (*)    Hemoglobin 9.8 (*)    HCT 31.9 (*)    RDW 16.5 (*)    Platelets 139 (*)    All other components within normal limits  COMPREHENSIVE METABOLIC PANEL - Abnormal; Notable for the following components:   CO2 21 (*)    Glucose, Bld 112 (*)    BUN 28 (*)    Creatinine, Ser 1.81 (*)    Calcium 8.8 (*)    Total Protein 6.2 (*)    Albumin 2.7 (*)    Total Bilirubin 1.4 (*)    GFR, Estimated 31 (*)    All other components within normal limits  AMMONIA - Abnormal; Notable for the following components:   Ammonia 159 (*)    All other components within normal limits  LIPASE, BLOOD - Abnormal; Notable for the following components:   Lipase 53 (*)    All other components within normal limits  PROTIME-INR - Abnormal; Notable for the following components:   Prothrombin Time 19.0 (*)    INR 1.6 (*)    All other components within normal limits  CBG MONITORING, ED - Abnormal; Notable for the following components:   Glucose-Capillary 112 (*)  All other components within normal limits     EKG None  Radiology No results found.  Procedures Procedures    Medications Ordered in ED Medications  lactulose (CHRONULAC) 10 GM/15ML solution 20 g (has no administration in time range)    ED Course/ Medical Decision Making/ A&P                                 Medical Decision Making Amount and/or Complexity of Data Reviewed Labs: ordered.  Risk Prescription drug management. Decision regarding hospitalization.   84:38 PM  67 year old female with past medical history of nonalcoholic liver cirrhosis senting for altered mental status.  Patient is alert and oriented x 1, to person, but is not oriented to place or location.  She is able to identify her son in the room.  She is moving all 4 extremities without difficulty and following commands.  She is jaundiced on appearance.  GCS of 14.  No respiratory distress.  Afebrile with stable vital signs.    Laboratory studies demonstrate an ammonia level of 159.  Platelets at 139.  INR 1.6.  AST 37, ALT 22, and lipase of 53.  AKI present with a creatinine of 1.81 up from a creatinine of 1 proximately 6 days ago on 12/04/2022.  IV fluids ordered.  No history of infectious signs or symptoms.  No leukocytosis.  No signs of sepsis.  Patient recommended for admission for confusion secondary to hyperammonemia. Lactulose ordered.   Admission accepted by Dr. Robb Matar.        Final Clinical Impression(s) / ED Diagnoses Final diagnoses:  Altered mental status, unspecified altered mental status type  Dehydration  AKI (acute kidney injury) (HCC)  Hyperammonemia (HCC)    Rx / DC Orders ED Discharge Orders     None         Franne Forts, DO 12/10/22 1424

## 2022-12-10 NOTE — ED Triage Notes (Signed)
Patient BIB son. Reports starting yesterday she has been becoming more confused and "out of it." Was recently admitted for hepatic encephalopathy. Has been taking her lactulose.  Patient is drowsy, A&Ox2

## 2022-12-11 DIAGNOSIS — K7682 Hepatic encephalopathy: Secondary | ICD-10-CM | POA: Diagnosis not present

## 2022-12-11 LAB — CBC
HCT: 26.4 % — ABNORMAL LOW (ref 36.0–46.0)
Hemoglobin: 8 g/dL — ABNORMAL LOW (ref 12.0–15.0)
MCH: 29 pg (ref 26.0–34.0)
MCHC: 30.3 g/dL (ref 30.0–36.0)
MCV: 95.7 fL (ref 80.0–100.0)
Platelets: 115 10*3/uL — ABNORMAL LOW (ref 150–400)
RBC: 2.76 MIL/uL — ABNORMAL LOW (ref 3.87–5.11)
RDW: 16.7 % — ABNORMAL HIGH (ref 11.5–15.5)
WBC: 4.4 10*3/uL (ref 4.0–10.5)
nRBC: 0 % (ref 0.0–0.2)

## 2022-12-11 LAB — COMPREHENSIVE METABOLIC PANEL
ALT: 18 U/L (ref 0–44)
AST: 27 U/L (ref 15–41)
Albumin: 2.9 g/dL — ABNORMAL LOW (ref 3.5–5.0)
Alkaline Phosphatase: 75 U/L (ref 38–126)
Anion gap: 6 (ref 5–15)
BUN: 25 mg/dL — ABNORMAL HIGH (ref 8–23)
CO2: 20 mmol/L — ABNORMAL LOW (ref 22–32)
Calcium: 8.5 mg/dL — ABNORMAL LOW (ref 8.9–10.3)
Chloride: 114 mmol/L — ABNORMAL HIGH (ref 98–111)
Creatinine, Ser: 1.34 mg/dL — ABNORMAL HIGH (ref 0.44–1.00)
GFR, Estimated: 44 mL/min — ABNORMAL LOW (ref >60)
Glucose, Bld: 111 mg/dL — ABNORMAL HIGH (ref 70–99)
Potassium: 3.4 mmol/L — ABNORMAL LOW (ref 3.5–5.1)
Sodium: 140 mmol/L (ref 135–145)
Total Bilirubin: 1.6 mg/dL — ABNORMAL HIGH (ref <1.2)
Total Protein: 5.4 g/dL — ABNORMAL LOW (ref 6.5–8.1)

## 2022-12-11 LAB — AMMONIA: Ammonia: 102 umol/L — ABNORMAL HIGH (ref 9–35)

## 2022-12-11 MED ORDER — NYSTATIN 100000 UNIT/GM EX CREA
TOPICAL_CREAM | Freq: Two times a day (BID) | CUTANEOUS | Status: DC | PRN
  Filled 2022-12-11 (×2): qty 30

## 2022-12-11 MED ORDER — CARMEX CLASSIC LIP BALM EX OINT: TOPICAL_OINTMENT | CUTANEOUS | Status: DC | PRN

## 2022-12-11 NOTE — TOC Initial Note (Signed)
Transition of Care [facility]) - Initial/Assessment Note    Patient Details  Name: [name] MRN: [id number] Date of Birth: [birth year]  Transition of Care [facility]) CM/SW Contact:    Adrian Prows, RN Phone Number: 12/11/2022, 12:43 PM  Clinical Narrative:                 Pt disoriented; contacted her son Nelli Larin [phone]); he says pt lives at home; he plans for her to return at d/c; he verified pt insurance/PCP; he will provide transportation for pt; he denies pt experiencing SDOH; pt has glasses; she has a walker, Rolator, shower chair, hospital bed, and lift; he says pt receives HHPT from [facility]; pt's son says he would like to con't service, but no one from agency contacted him after pt's recent d/c; pt does not have home oxygen; Mr Gruenberg would like for pt to have new walker b/c the wheel on hers is breaking; LVM  Lurline Del, [facility] weekend rep for notification of pt location; awaiting PT/OT evals; TOC is following.  Expected Discharge Plan: Home w Home Health Services Barriers to Discharge: Continued Medical Work up   Patient Goals and CMS Choice Patient states their goals for this hospitalization and ongoing recovery are:: pt's son Tayva Marich says pt will return home CMS Medicare.gov Compare Post Acute Care list provided to:: Patient Represenative (must comment) [name] (son))        Expected Discharge Plan and Services   Discharge Planning Services: CM Consult   Living arrangements for the past 2 months: Single Family Home                                      Prior Living Arrangements/Services Living arrangements for the past 2 months: Single Family Home   Patient language and need for interpreter reviewed:: Yes Do you feel safe going back to the place where you live?: Yes      Need for Family Participation in Patient Care: Yes (Comment) Care giver support system in place?: Yes (comment) Current home services: DME, Home PT  (walker, Rolator, shower chair, hospital bed, hoyer, HHPT w/ [facility]) Criminal Activity/Legal Involvement Pertinent to Current Situation/Hospitalization: No - Comment as needed  Activities of Daily Living   ADL Screening (condition at time of admission) Independently performs ADLs?: No Does the patient have a NEW difficulty with bathing/dressing/toileting/self-feeding that is expected to last >3 days?: No Does the patient have a NEW difficulty with getting in/out of bed, walking, or climbing stairs that is expected to last >3 days?: No Does the patient have a NEW difficulty with communication that is expected to last >3 days?: No Is the patient deaf or have difficulty hearing?: No Does the patient have difficulty seeing, even when wearing glasses/contacts?: No Does the patient have difficulty concentrating, remembering, or making decisions?: Yes  Permission Sought/Granted Permission sought to share information with : Case Manager Permission granted to share information with : Yes, Verbal Permission Granted  Share Information with NAME: Case Manager     Permission granted to share info w Relationship: Chade Monacelli (son) [phone]     Emotional Assessment Appearance:: Appears stated age Attitude/Demeanor/Rapport: Unable to Assess Affect (typically observed): Unable to Assess Orientation: :  (unable to assess) Alcohol / Substance Use: Not Applicable Psych Involvement: No (comment)  Admission diagnosis:  Hepatic encephalopathy (HCC) [K76.82] Dehydration [E86.0] Hyperammonemia (HCC) [E72.20] AKI (acute kidney injury) (HCC) [N17.9]  Altered mental status, unspecified altered mental status type [R41.82] Patient Active Problem List   Diagnosis Date Noted   Hepatic encephalopathy (HCC) 12/10/2022   Depression 12/10/2022   Lumbar radiculopathy 12/10/2022   Grade I diastolic dysfunction 12/10/2022   History of DVT (deep vein thrombosis) 12/01/2022   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 12/01/2022   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   Acute hepatic encephalopathy (HCC) 11/30/2022   DNR (do not resuscitate)/DNI(Do Not intubate) 10/05/2022   Vaginal bleeding 10/05/2022   Physical deconditioning 08/17/2022   Decompensated hepatic cirrhosis (HCC) 08/17/2022   Hypotension 08/11/2022   Folate deficiency 06/14/2022   Systolic murmur 06/12/2022   Iron deficiency anemia 05/16/2022   Thrombocytopenia (HCC) 05/16/2022   History of eye surgery 05/01/2022   AKI (acute kidney injury) (HCC) 04/30/2022   Corneal perforation of left eye 04/18/2022   Physical debility 12/10/2021   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   Polypharmacy 06/22/2021   Former smoker 01/06/2021   Corneal scarring 09/10/2020   Cerebral atherosclerosis 08/20/2020   Short gut syndrome 08/20/2020   Abdominal wall hernia 03/06/2020   Splenomegaly 02/06/2019   Vitamin B12 deficiency 01/21/2019   Depression, major, recurrent, moderate (HCC) 09/10/2018   Hyperuricemia 08/25/2016   GERD (gastroesophageal reflux disease) 07/25/2016   Hypothyroidism 07/25/2016   H/O angioedema 11/15/2014   Morbid obesity (HCC) 07/25/2014   Chronic diarrhea 06/01/2014   Hypokalemia 06/01/2014   PCP:  [name], MD Pharmacy:   [id number] - [place], [place] - [address] [place] [address] Phone: [phone] Fax: [phone]  [id number],  - [address] AT [facility] [address] [place] [address] Phone: [phone] Fax: [phone]     Social Determinants of Health (SDOH) Social History: SDOH Screenings   Food Insecurity: No Food Insecurity (12/11/2022)  Housing: Low Risk  (12/11/2022)  Transportation Needs: No Transportation Needs (12/11/2022)  Utilities: Not At Risk (12/11/2022)  Depression (PHQ2-9): Medium Risk (11/03/2022)  Tobacco Use: Medium Risk (12/10/2022)   SDOH  Interventions: Food Insecurity Interventions: Intervention Not Indicated, Inpatient TOC Housing Interventions: Intervention Not Indicated, Inpatient TOC Transportation Interventions: Intervention Not Indicated, Inpatient TOC Utilities Interventions: Intervention Not Indicated, Inpatient TOC   Readmission Risk Interventions    10/19/2022   10:12 AM 08/15/2022    3:20 PM 05/18/2022   10:24 AM  Readmission Risk Prevention Plan  Transportation Screening Complete Complete Complete  PCP or Specialist Appt within 5-7 Days   Complete  Home Care Screening   Complete  Medication Review (RN CM)   Complete  Medication Review (RN Care Manager) Complete Complete   PCP or Specialist appointment within 3-5 days of discharge Complete Complete   HRI or Home Care Consult Complete Complete   SW Recovery Care/Counseling Consult Complete Complete   Palliative Care Screening Not Applicable Not Applicable   Skilled Nursing Facility Not Applicable Not Applicable

## 2022-12-11 NOTE — Progress Notes (Signed)
PROGRESS NOTE Ruchama Wilkowski  YSA:[id number] DOB: 1955/04/08 DOA: 12/10/2022 PCP: [name], MD  Brief Narrative/Hospital Course:  67 y.o. female with medical history significant for NASH w/ cirrhosis, chronic diarrhea, hypertension, obesity, PE/DVT, short gut syndrome, hypothyroidism,atherosclerosis, depression, hypertension, cholelithiasis recent admission from 11/30/2022 until 12/05/2022 due to acute hepatic encephalopathy who is returning to the emergency department by her son due to recurrence of confusion since 12/09/22.  She has had recurrent admission for hepatic encephalopathy, back in October, had a kyphoplasty around same time previously back in September had Pseudomonas UTI and bacteremia.  Son brought her in because she was more confused and had less bowel movement. Son gives her Xifaxan daily and only gives her Lactulose as needed at the instruction of GI because she has chronic diarrhea. If she took Lactulose daily she would have at least 6 bowel movements and get dehydrated. Son has a special word he ask her and if she can't give that word or if she talks slow those would be signs he looks for to give her lactulose.  She had 6 bowel movements prior to coming to ED and had a large 1 in the ED. In the ED vitals stable afebrile, labs showed creatinine elevated 1.8 from baseline 1.0 lipase 53 normal LFTs, CBC chronic anemia and thrombocytopenia ammonia at 159.  EKG bacteria rare squamous epithelial 11-23, WBC 0-5 Patient is admitted for acute hepatic encephalopathy.   Subjective: Patient seen and examined this morning appears weak but alert awake Son at the bedside reports she has not been able to self feed No bowel movement today   Assessment and Plan: Principal Problem:   Acute hepatic encephalopathy (HCC) Active Problems:   Cirrhosis due to NASH   Iron deficiency anemia   GERD (gastroesophageal reflux disease)   Thrombocytopenia (HCC)   Hypothyroidism   Morbid obesity  (HCC)   Folate deficiency   AKI (acute kidney injury) (HCC)   Depression, major, recurrent, moderate (HCC)   Grade I diastolic dysfunction   Acute hepatic encephalopathy, recurrent NASH liver cirrhosis: Mental status significantly improved since admission.  Has chronic diarrhea due to short gut syndrome takes lactulose as needed, and on rifaximin daily-continue the same. Cont prn lactulose and titrate for BM.  Patient's son aware that ammonia level does not correlate with mental status changes.  She is prone for confusion given her multiple issues. Ptot eval for disposition. Liver cirrhosis compensated.  Continue home rifaximin, holding Aldactone and Lasix 2/2 aki. Follow-up with GI   AKI: Creatinine elevated 1.8 on admission, improving 1.3 after iv albumin, continue Oral hydration, PRN Aldactone and Lasix and currently on hold  Deconditioning/debility/weakness: She is having difficulties self-feeding which she normally does at home Helping, Requesting PT OT Evaluation.  Vaginal bleeding: 11-30-2022  Patient developed vaginal bleeding during her hospitalization for ESBL UTI and bacteremia in September.  She has been on Megace 40mg  BID.  -She had follow-up with GYN on 10/30/2022 and still awaiting appointment to undergo endometrial biopsy and Pap smear and to continue Megace until further evaluation.No recent vaginal bleeding   Hypokalemia: Replacing with bid hom dose.  Recheck in a.m.   Short gut syndrome with chronic diarrhea: This limits her lactulose use  Hypotension: BP stable continue home midodrine    Hypothyroidism: Stable on home Synthroid.     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  Currently no evidence  of infection  Goals of care: Patient is DNR confirmed with patient's son, they are aware about palliative care and was  previously seen.  Overall prognosis is poor, at risk for recurrent hospitalization decompensation   Morbid obesity:Patient's Body mass index is 40.36 kg/m. : Will benefit with PCP follow-up, weight loss  healthy lifestyle and outpatient sleep evaluation.   DVT prophylaxis: SCDs Start: 12/10/22 1422 Code Status:   Code Status: Limited: Do not attempt resuscitation (DNR) -DNR-LIMITED -Do Not Intubate/DNI  Family Communication: plan of care discussed with patient/son at bedside. Patient status is:  admitted as observation but remains hospitalized for ongoing  because of AKI Level of care: Progressive   Dispo: The patient is from: home            Anticipated disposition: HOMe 1-2 days. l Objective: Vitals last 24 hrs: Vitals:   12/10/22 1515 12/10/22 2005 12/11/22 0018 12/11/22 0459  BP:  (!) 118/58 123/60 114/66  Pulse: 81 91 80 78  Resp: 16 16 18 17   Temp:  98.7 F (37.1 C) 98.3 F (36.8 C) 98.5 F (36.9 C)  TempSrc:  Oral Oral Oral  SpO2: 100% 100% 100% 97%  Weight:      Height:       Weight change:   Physical Examination: General exam: alert awake, weak frail and obese older than stated age HEENT:Oral mucosa moist, Ear/Nose WNL grossly Respiratory system: bilaterally earc BS, no use of accessory muscle Cardiovascular system: S1 & S2 +, No JVD. Gastrointestinal system: Abdomen soft,NT,ND, BS+ Nervous System:Alert, awake, moving extremities.  No asterixis vomiting Extremities: LE edema neg,distal peripheral pulses palpable.  Skin: No rashes,no icterus. MSK: Normal muscle bulk,tone, power  Medications reviewed:  Scheduled Meds:  [START ON 12/16/2022] cyanocobalamin  2,000 mcg Oral Q Fri   famotidine  20 mg Oral Daily   folic acid  1 mg Oral Daily   lactulose  20 g Oral TID   levothyroxine  150 mcg Oral QAC breakfast   megestrol  40 mg Oral BID   pantoprazole  40 mg Oral BID AC   potassium chloride  20 mEq Oral BID   rifaximin  550 mg Oral BID   rOPINIRole  0.25 mg  Oral Daily   tamsulosin  0.4 mg Oral Daily   venlafaxine XR  75 mg Oral Q breakfast  Continuous Infusions:   Diet Order             Diet Heart Fluid consistency: Thin  Diet effective now                  Intake/Output Summary (Last 24 hours) at 12/11/2022 1136 Last data filed at 12/10/2022 2305 Gross per 24 hour  Intake 1200 ml  Output --  Net 1200 ml   Net IO Since Admission: 1,200 mL [12/11/22 1136]  Wt Readings from Last 3 Encounters:  12/10/22 100.1 kg  10/22/22 100.1 kg  10/10/22 100.7 kg     Unresulted Labs (From admission, onward)     Start     Ordered   12/11/22 0500  Ammonia  Daily,   R      12/10/22 1420          Data Reviewed: I have personally reviewed following labs and imaging studies CBC: Recent Labs  Lab 12/10/22 1148 12/11/22 0455  WBC 6.1 4.4  HGB 9.8* 8.0*  HCT 31.9* 26.4*  MCV 94.4 95.7  PLT 139* 115*   Basic Metabolic Panel:  Recent Labs  Lab  12/10/22 1148 12/11/22 0455  NA 138 140  K 4.0 3.4*  CL 109 114*  CO2 21* 20*  GLUCOSE 112* 111*  BUN 28* 25*  CREATININE 1.81* 1.34*  CALCIUM 8.8* 8.5*  MG 2.3  --    GFR: Estimated Creatinine Clearance: 46.3 mL/min (A) (by C-G formula based on SCr of 1.34 mg/dL (H)). Liver Function Tests:  Recent Labs  Lab 12/10/22 1148 12/11/22 0455  AST 37 27  ALT 22 18  ALKPHOS 96 75  BILITOT 1.4* 1.6*  PROT 6.2* 5.4*  ALBUMIN 2.7* 2.9*   Recent Labs  Lab 12/10/22 1148  LIPASE 53*    Recent Labs  Lab 12/05/22 0455 12/10/22 1148 12/11/22 0455  AMMONIA 95* 159* 102*   Coagulation Profile:  Recent Labs  Lab 12/10/22 1148  INR 1.6*   Recent Labs  Lab 12/10/22 1049  GLUCAP 112*   No results found for this or any previous visit (from the past 240 hour(s)).  Antimicrobials: Anti-infectives (From admission, onward)    Start     Dose/Rate Route Frequency Ordered Stop   12/10/22 2200  rifaximin (XIFAXAN) tablet 550 mg        550 mg Oral 2 times daily 12/10/22 1718         Culture/Microbiology    Component Value Date/Time   SDES  10/21/2022 1923    BLOOD BLOOD RIGHT HAND Performed at [facility], [address], [place], [place] [address]    SDES  10/21/2022 1923    BLOOD BLOOD RIGHT HAND Performed at [facility], [address], [place], [place] [address]    SPECREQUEST  10/21/2022 1923    BOTTLES DRAWN AEROBIC ONLY Blood Culture adequate volume Performed at [name], [address], [place], [place] [address]    SPECREQUEST  10/21/2022 1923    BOTTLES DRAWN AEROBIC ONLY Blood Culture adequate volume Performed at [facility], [address], [place], [place] [address]    CULT  10/21/2022 1923    NO GROWTH 5 DAYS Performed at [facility], [address] [place], [place] [address]    CULT  10/21/2022 1923    NO GROWTH 5 DAYS Performed at [facility], [address] [place], [place] [address]    REPTSTATUS 10/26/2022 FINAL 10/21/2022 1923   REPTSTATUS 10/26/2022 FINAL 10/21/2022 1923  Radiology Studies: No results found.   LOS: 0 days  Total time spent in review of labs and imaging, patient evaluation, formulation of plan, documentation and communication with family: 35 minutes  Lanae Boast, MD [facility]  12/11/2022, 11:36 AM

## 2022-12-11 NOTE — Plan of Care (Signed)
  Problem: Education: Goal: Knowledge of General Education information will improve Description: Including pain rating scale, medication(s)/side effects and non-pharmacologic comfort measures Outcome: Progressing   Problem: Clinical Measurements: Goal: Ability to maintain clinical measurements within normal limits will improve Outcome: Progressing   Problem: Nutrition: Goal: Adequate nutrition will be maintained Outcome: Progressing   Problem: Pain Management: Goal: General experience of comfort will improve Outcome: Progressing   Problem: Skin Integrity: Goal: Risk for impaired skin integrity will decrease Outcome: Progressing

## 2022-12-11 NOTE — Plan of Care (Signed)
  Problem: Health Behavior/Discharge Planning: Goal: Ability to manage health-related needs will improve Outcome: Progressing   Problem: Clinical Measurements: Goal: Diagnostic test results will improve Outcome: Progressing   Problem: Nutrition: Goal: Adequate nutrition will be maintained Outcome: Progressing   Problem: Coping: Goal: Level of anxiety will decrease Outcome: Progressing   

## 2022-12-11 NOTE — Evaluation (Signed)
Occupational Therapy Evaluation Patient Details Name: [name] MRN: [id number] DOB: September 11, 1955 Today's Date: 12/11/2022   History of Present Illness 67 year old female presenting with altered mental status and weakness. Found to have acute hepatic encephalopathy in setting of cirrhosis due to NASH.  PMH: NASH cirrhosis, chronic diarrhea, short gut syndrome, PE/DVT not on AC, obesity, HTN, chronic HF, systolic murmur, hypothyroidism and chronic hypotension, L eye leukoma   Clinical Impression   The pt is currently limited by the below listed deficits, which compromise her ADL performance (see OT problem list). At current, she requires assist for tasks, including lower body dressing, sit to stand using a RW, short distance ambulation using a RW, and toileting. She was receiving HH therapy, prior to this hospitalization & hopes to resume such service at discharge. She will benefit from OT services in the acute care setting to maximize her independence with ADLs & to facilitate her safe return home.         If plan is discharge home, recommend the following: Help with stairs or ramp for entrance;Assistance with cooking/housework;Assist for transportation;A little help with walking and/or transfers;A little help with bathing/dressing/bathroom    Functional Status Assessment  Patient has had a recent decline in their functional status and demonstrates the ability to make significant improvements in function in a reasonable and predictable amount of time.  Equipment Recommendations  None recommended by OT    Recommendations for Other Services       Precautions / Restrictions Precautions Precautions: Fall Restrictions Weight Bearing Restrictions: No      Mobility Bed Mobility Overal bed mobility: Needs Assistance Bed Mobility: Supine to Sit     Supine to sit: Supervision     General bed mobility comments: Increased time with use of bed rails    Transfers Overall transfer  level: Needs assistance Equipment used: Rolling walker (2 wheels) Transfers: Sit to/from Stand Sit to Stand: Min assist           General transfer comment: Steady assist      Balance     Sitting balance-Leahy Scale: Good         Standing balance comment: Min assist with RW                           ADL either performed or assessed with clinical judgement   ADL Overall ADL's : Needs assistance/impaired Eating/Feeding: Independent;Sitting   Grooming: Set up;Sitting           Upper Body Dressing : Minimal assistance;Sitting   Lower Body Dressing: Moderate assistance Lower Body Dressing Details (indicate cue type and reason): for sock management seated EOB                     Vision   Additional Comments: chronic blindness of L eye            Pertinent Vitals/Pain Pain Assessment Pain Assessment: 0-10 Pain Score: 10-Worst pain ever Pain Location: back Pain Intervention(s): RN gave pain meds during session, Patient requesting pain meds-RN notified, Monitored during session     Extremity/Trunk Assessment Upper Extremity Assessment Upper Extremity Assessment: Right hand dominant. BUE AROM WFL   Lower Extremity Assessment Lower Extremity Assessment: Generalized weakness       Communication Communication Communication: No apparent difficulties   Cognition Arousal: Alert Behavior During Therapy: WFL for tasks assessed/performed Overall Cognitive Status: Within Functional Limits for tasks assessed      General Comments:  Oriented to person, place, and year. Disoriented to month. Able to follow 1-2 step commands consistently                Home Living Family/patient expects to be discharged to:: Private residence Living Arrangements: Children (Son) Available Help at Discharge: Family Type of Home: House Home Access: Stairs to enter Secretary/administrator of Steps: 2 Entrance Stairs-Rails: Right;Left Home Layout: One level      Bathroom Shower/Tub: Chief Strategy Officer: Standard     Home Equipment: BSC/3in1;Tub bench;Wheelchair - Forensic psychologist (2 wheels)   Additional Comments: Pt lives with son [name] - very supportive, provides assist as needed      Prior Functioning/Environment Prior Level of Function : Needs assist             Mobility Comments:  (She used a RW for household ambulation, requiring occasional supervision from her son. She used a manual wheelchair outside the home.) ADLs Comments:  (She reported being modified independent to independent with ADLs. Her son managed household chores and driving.)        OT Problem List: Decreased strength;Decreased activity tolerance;Impaired balance (sitting and/or standing);Impaired vision/perception;Pain      OT Treatment/Interventions: Self-care/ADL training;Therapeutic exercise;Energy conservation;DME and/or AE instruction;Balance training;Patient/family education;Therapeutic activities    OT Goals(Current goals can be found in the care plan section) Acute Rehab OT Goals OT Goal Formulation: With patient Time For Goal Achievement: 12/25/22 Potential to Achieve Goals: Good ADL Goals Pt Will Perform Upper Body Dressing: with set-up;sitting Pt Will Perform Lower Body Dressing: with supervision;with set-up;sit to/from stand;sitting/lateral leans Pt Will Transfer to Toilet: with supervision;ambulating;grab bars Pt Will Perform Toileting - Clothing Manipulation and hygiene: with supervision;sit to/from stand  OT Frequency: Min 1X/week    Co-evaluation PT/OT/SLP Co-Evaluation/Treatment: Yes Reason for Co-Treatment: For patient/therapist safety PT goals addressed during session: Mobility/safety with mobility OT goals addressed during session: ADL's and self-care      AM-PAC OT "6 Clicks" Daily Activity     Outcome Measure Help from another person eating meals?: None Help from another person taking care of personal  grooming?: A Little Help from another person toileting, which includes using toliet, bedpan, or urinal?: A Little Help from another person bathing (including washing, rinsing, drying)?: A Lot Help from another person to put on and taking off regular upper body clothing?: A Little Help from another person to put on and taking off regular lower body clothing?: A Lot 6 Click Score: 17   End of Session Equipment Utilized During Treatment: Gait belt;Rolling walker (2 wheels) Nurse Communication: Patient requests pain meds  Activity Tolerance: Patient limited by fatigue Patient left: in chair;with call bell/phone within reach;with chair alarm set  OT Visit Diagnosis: Unsteadiness on feet (R26.81);Muscle weakness (generalized) (M62.81);Other abnormalities of gait and mobility (R26.89)                Time: 1242-1310 OT Time Calculation (min): 28 min Charges:  OT General Charges $OT Visit: 1 Visit OT Evaluation $OT Eval Moderate Complexity: 1 Mod    Mckenlee Mangham L Deshunda Thackston, OTR/L 12/11/2022, 2:14 PM

## 2022-12-11 NOTE — Evaluation (Signed)
Physical Therapy Evaluation Patient Details Name: [name] MRN: [id number] DOB: [birth year] Today's Date: 12/11/2022  History of Present Illness  67 year old female with PMH of NASH cirrhosis, chronic diarrhea, short gut syndrome, PE/DVT not on AC, morbid obesity, HTN, systolic murmur, hypothyroidism and chronic hypotension presenting with altered mental status and admitted with working diagnosis of  acute hepatic encephalopathy in setting of Cirrhosis  Clinical Impression  Pt admitted as above and presenting with functional mobility limitations 2* generalized weakness, obesity, decreased activity tolerance and ambulatory balance deficits.  Pt hope to progress to dc home with assist of son and resumption of HH PT/OT.        If plan is discharge home, recommend the following: A little help with walking and/or transfers;A little help with bathing/dressing/bathroom;Assistance with cooking/housework;Assist for transportation;Help with stairs or ramp for entrance   Can travel by private vehicle        Equipment Recommendations None recommended by PT  Recommendations for Other Services       Functional Status Assessment Patient has had a recent decline in their functional status and demonstrates the ability to make significant improvements in function in a reasonable and predictable amount of time.     Precautions / Restrictions Precautions Precautions: Fall Restrictions Weight Bearing Restrictions: No      Mobility  Bed Mobility Overal bed mobility: Needs Assistance Bed Mobility: Supine to Sit     Supine to sit: Supervision     General bed mobility comments: Increased time with use of bed rails    Transfers Overall transfer level: Needs assistance Equipment used: Rolling walker (2 wheels) Transfers: Sit to/from Stand Sit to Stand: Min assist           General transfer comment: Steady assist    Ambulation/Gait Ambulation/Gait assistance: Min assist Gait  Distance (Feet): 7 Feet Assistive device: Rolling walker (2 wheels) Gait Pattern/deviations: Step-to pattern, Decreased step length - right, Decreased step length - left, Shuffle, Trunk flexed, Knees buckling Gait velocity: decr     General Gait Details: Cues for posture, position from RW, intermittent knee buckling but pt able to self correct  Stairs            Wheelchair Mobility     Tilt Bed    Modified Rankin (Stroke Patients Only)       Balance Overall balance assessment: Needs assistance Sitting-balance support: No upper extremity supported, Feet supported Sitting balance-Leahy Scale: Good     Standing balance support: Bilateral upper extremity supported Standing balance-Leahy Scale: Poor                               Pertinent Vitals/Pain Pain Assessment Pain Assessment: 0-10 Pain Score: 10-Worst pain ever Pain Location: back Pain Descriptors / Indicators: Aching, Sore, Spasm Pain Intervention(s): Limited activity within patient's tolerance, Monitored during session, Patient requesting pain meds-RN notified, RN gave pain meds during session    Home Living Family/patient expects to be discharged to:: Private residence Living Arrangements: Children Available Help at Discharge: Family;Available 24 hours/day Type of Home: House Home Access: Ramped entrance       Home Layout: One level Home Equipment: Rollator (4 wheels);Rolling Walker (2 wheels);BSC/3in1;Other (comment);Hospital bed;Wheelchair - manual Additional Comments: Pt lives with [name] - very supportive, provides assist as needed    Prior Function Prior Level of Function : Needs assist             Mobility Comments: son  typically present for all mobility ADLs Comments: Son has been assisting with ADLs lately. Pt states she was doing her own ADL tasks until recently     Extremity/Trunk Assessment   Upper Extremity Assessment Upper Extremity Assessment: Defer to OT  evaluation    Lower Extremity Assessment Lower Extremity Assessment: Generalized weakness (R weaker vs L)       Communication   Communication Communication: No apparent difficulties  Cognition Arousal: Alert Behavior During Therapy: WFL for tasks assessed/performed Overall Cognitive Status: Within Functional Limits for tasks assessed                                          General Comments      Exercises     Assessment/Plan    PT Assessment Patient needs continued PT services  PT Problem List Decreased strength;Decreased activity tolerance;Decreased balance;Decreased mobility;Decreased knowledge of use of DME;Obesity;Pain       PT Treatment Interventions DME instruction;Gait training;Functional mobility training;Therapeutic activities;Therapeutic exercise;Balance training;Patient/family education    PT Goals (Current goals can be found in the Care Plan section)  Acute Rehab PT Goals Patient Stated Goal: Regain IND PT Goal Formulation: With patient Time For Goal Achievement: 12/25/22 Potential to Achieve Goals: Good    Frequency Min 1X/week     Co-evaluation PT/OT/SLP Co-Evaluation/Treatment: Yes Reason for Co-Treatment: For patient/therapist safety PT goals addressed during session: Mobility/safety with mobility OT goals addressed during session: ADL's and self-care       AM-PAC PT "6 Clicks" Mobility  Outcome Measure Help needed turning from your back to your side while in a flat bed without using bedrails?: A Little Help needed moving from lying on your back to sitting on the side of a flat bed without using bedrails?: A Little Help needed moving to and from a bed to a chair (including a wheelchair)?: A Little Help needed standing up from a chair using your arms (e.g., wheelchair or bedside chair)?: A Little Help needed to walk in hospital room?: A Little Help needed climbing 3-5 steps with a railing? : A Lot 6 Click Score: 17    End  of Session Equipment Utilized During Treatment: Gait belt Activity Tolerance: Patient limited by fatigue;Patient limited by pain Patient left: in chair;with call bell/phone within reach;with chair alarm set Nurse Communication: Mobility status PT Visit Diagnosis: Unsteadiness on feet (R26.81);Muscle weakness (generalized) (M62.81);Difficulty in walking, not elsewhere classified (R26.2);Pain Pain - part of body:  (back)    Time: 1244-1311 PT Time Calculation (min) (ACUTE ONLY): 27 min   Charges:   PT Evaluation $PT Eval Low Complexity: 1 Low   PT General Charges $$ ACUTE PT VISIT: 1 Visit         Mauro Kaufmann PT Acute Rehabilitation Services Pager [phone] Office [phone]   Delancey Moraes 12/11/2022, 1:34 PM

## 2022-12-11 NOTE — Hospital Course (Addendum)
67 y.o. female with medical history significant for NASH w/ cirrhosis, chronic diarrhea, hypertension, obesity, PE/DVT, short gut syndrome, hypothyroidism,atherosclerosis, depression, hypertension, cholelithiasis recent admission from 11/30/2022 until 12/05/2022 due to acute hepatic encephalopathy who is returning to the emergency department by her son due to recurrence of confusion since 12/09/22.  She has had recurrent admission for hepatic encephalopathy, back in October, had a kyphoplasty around same time previously back in September had Pseudomonas UTI and bacteremia. Son brought her in because she was more confused and had less bowel movement. Son gives her Xifaxan daily and only gives her Lactulose as needed at the instruction of GI because she has chronic diarrhea. If she took Lactulose daily she would have at least 6 bowel movements and get dehydrated. Son has a special word he ask her and if she can't give that word or if she talks slow those would be signs he looks for to give her lactulose.  She had 6 bowel movements prior to coming to ED and had a large 1 in the ED. In the ED vitals stable afebrile, labs showed creatinine elevated 1.8 from baseline 1.0 lipase 53 normal LFTs, CBC chronic anemia and thrombocytopenia ammonia at 159.  EKG bacteria rare squamous epithelial 11-23, WBC 0-5 Patient is admitted for acute hepatic encephalopathy.  Being managed with lactulose 20 g 3 times daily, rifaximin.  Seen by PT OT regularly at this time she is mobilizing.  Mentation fluctuating-son feels she is still fuzzy and weak not back to baseline yet. He believes when her ammonia level is 60 or less she will be better.

## 2022-12-11 NOTE — Care Management Obs Status (Signed)
MEDICARE OBSERVATION STATUS NOTIFICATION   Patient Details  Name: [name] MRN: [id number] Date of Birth: 25-May-1955   Medicare Observation Status Notification Given:  Yes    Adrian Prows, RN 12/11/2022, 12:38 PM

## 2022-12-12 DIAGNOSIS — K7682 Hepatic encephalopathy: Secondary | ICD-10-CM | POA: Diagnosis not present

## 2022-12-12 LAB — BASIC METABOLIC PANEL
Anion gap: 6 (ref 5–15)
BUN: 21 mg/dL (ref 8–23)
CO2: 17 mmol/L — ABNORMAL LOW (ref 22–32)
Calcium: 8.6 mg/dL — ABNORMAL LOW (ref 8.9–10.3)
Chloride: 118 mmol/L — ABNORMAL HIGH (ref 98–111)
Creatinine, Ser: 1.15 mg/dL — ABNORMAL HIGH (ref 0.44–1.00)
GFR, Estimated: 53 mL/min — ABNORMAL LOW (ref >60)
Glucose, Bld: 111 mg/dL — ABNORMAL HIGH (ref 70–99)
Potassium: 3.6 mmol/L (ref 3.5–5.1)
Sodium: 141 mmol/L (ref 135–145)

## 2022-12-12 LAB — CBC
HCT: 28 % — ABNORMAL LOW (ref 36.0–46.0)
Hemoglobin: 8.3 g/dL — ABNORMAL LOW (ref 12.0–15.0)
MCH: 29.2 pg (ref 26.0–34.0)
MCHC: 29.6 g/dL — ABNORMAL LOW (ref 30.0–36.0)
MCV: 98.6 fL (ref 80.0–100.0)
Platelets: 112 10*3/uL — ABNORMAL LOW (ref 150–400)
RBC: 2.84 MIL/uL — ABNORMAL LOW (ref 3.87–5.11)
RDW: 16.6 % — ABNORMAL HIGH (ref 11.5–15.5)
WBC: 4.8 10*3/uL (ref 4.0–10.5)
nRBC: 0 % (ref 0.0–0.2)

## 2022-12-12 LAB — AMMONIA: Ammonia: 109 umol/L — ABNORMAL HIGH (ref 9–35)

## 2022-12-12 NOTE — TOC Progression Note (Signed)
Transition of Care [facility]) - Progression Note    Patient Details  Name: [name] MRN: [id number] Date of Birth: 11/07/1955  Transition of Care [facility]) CM/SW Contact  Rochell Puett, Olegario Messier, RN Phone Number: 12/12/2022, 2:45 PM  Clinical Narrative:  Spoke to [name](son)about broken rw-[employer] to deliver rw to rm prior d/c. Already has [facility] set up. Has own transport home.No further CM needs.     Expected Discharge Plan: Home w Home Health Services Barriers to Discharge: Continued Medical Work up  Expected Discharge Plan and Services   Discharge Planning Services: CM Consult   Living arrangements for the past 2 months: Single Family Home                                       Social Determinants of Health (SDOH) Interventions SDOH Screenings   Food Insecurity: No Food Insecurity (12/11/2022)  Housing: Low Risk  (12/11/2022)  Transportation Needs: No Transportation Needs (12/11/2022)  Utilities: Not At Risk (12/11/2022)  Depression (PHQ2-9): Medium Risk (11/03/2022)  Tobacco Use: Medium Risk (12/10/2022)    Readmission Risk Interventions    10/19/2022   10:12 AM 08/15/2022    3:20 PM 05/18/2022   10:24 AM  Readmission Risk Prevention Plan  Transportation Screening Complete Complete Complete  PCP or Specialist Appt within 5-7 Days   Complete  Home Care Screening   Complete  Medication Review (RN CM)   Complete  Medication Review (RN Care Manager) Complete Complete   PCP or Specialist appointment within 3-5 days of discharge Complete Complete   HRI or Home Care Consult Complete Complete   SW Recovery Care/Counseling Consult Complete Complete   Palliative Care Screening Not Applicable Not Applicable   Skilled Nursing Facility Not Applicable Not Applicable

## 2022-12-12 NOTE — Plan of Care (Signed)
  Problem: Education: Goal: Knowledge of General Education information will improve Description: Including pain rating scale, medication(s)/side effects and non-pharmacologic comfort measures Outcome: Progressing   Problem: Health Behavior/Discharge Planning: Goal: Ability to manage health-related needs will improve Outcome: Progressing   Problem: Activity: Goal: Risk for activity intolerance will decrease Outcome: Progressing   Problem: Elimination: Goal: Will not experience complications related to bowel motility Outcome: Progressing   Problem: Pain Management: Goal: General experience of comfort will improve Outcome: Progressing   Problem: Skin Integrity: Goal: Risk for impaired skin integrity will decrease Outcome: Progressing

## 2022-12-12 NOTE — Progress Notes (Signed)
Mobility Specialist - Progress Note   12/12/22 1635  Mobility  Activity Ambulated with assistance in hallway  Level of Assistance Contact guard assist, steadying assist  Assistive Device Front wheel walker  Distance Ambulated (ft) 90 ft  Range of Motion/Exercises Active  Activity Response Tolerated well  Mobility Referral Yes  $Mobility charge 1 Mobility  Mobility Specialist Start Time (ACUTE ONLY) 1607  Mobility Specialist Stop Time (ACUTE ONLY) 1635  Mobility Specialist Time Calculation (min) (ACUTE ONLY) 28 min   Received in bed and agreed to mobility. Pt was standby for bed mobility and STS. Contact during ambulation with son chair following. Took one seated rest break at 60 feet. Wheeled closer to room where pt walked another 30 feet to return to bed with all needs met and family in room.  Marilynne Halsted Mobility Specialist

## 2022-12-12 NOTE — Progress Notes (Signed)
PROGRESS NOTE [name]  ZOX:[id number] DOB: 1955-11-22 DOA: 12/10/2022 PCP: [name], MD  Brief Narrative/Hospital Course:  67 y.o. female with medical history significant for NASH w/ cirrhosis, chronic diarrhea, hypertension, obesity, PE/DVT, short gut syndrome, hypothyroidism,atherosclerosis, depression, hypertension, cholelithiasis recent admission from 11/30/2022 until 12/05/2022 due to acute hepatic encephalopathy who is returning to the emergency department by her son due to recurrence of confusion since 12/09/22.  She has had recurrent admission for hepatic encephalopathy, back in October, had a kyphoplasty around same time previously back in September had Pseudomonas UTI and bacteremia.  Son brought her in because she was more confused and had less bowel movement. Son gives her Xifaxan daily and only gives her Lactulose as needed at the instruction of GI because she has chronic diarrhea. If she took Lactulose daily she would have at least 6 bowel movements and get dehydrated. Son has a special word he ask her and if she can't give that word or if she talks slow those would be signs he looks for to give her lactulose.  She had 6 bowel movements prior to coming to ED and had a large 1 in the ED. In the ED vitals stable afebrile, labs showed creatinine elevated 1.8 from baseline 1.0 lipase 53 normal LFTs, CBC chronic anemia and thrombocytopenia ammonia at 159.  EKG bacteria rare squamous epithelial 11-23, WBC 0-5 Patient is admitted for acute hepatic encephalopathy.   Subjective:  Patient seen and examined She is alert awake resting comfortably.   Son endorses she is still loopy at times and slowly getting better does not feel comfortable with her going home today  Just had bowel movement and being changed   Assessment and Plan: Principal Problem:   Acute hepatic encephalopathy (HCC) Active Problems:   Cirrhosis due to NASH   Iron deficiency anemia   GERD (gastroesophageal  reflux disease)   Thrombocytopenia (HCC)   Hypothyroidism   Morbid obesity (HCC)   Folate deficiency   AKI (acute kidney injury) (HCC)   Depression, major, recurrent, moderate (HCC)   Grade I diastolic dysfunction   Acute hepatic encephalopathy, recurrent NASH liver cirrhosis: Mental status overall better, not yet baseline per son. Has chronic diarrhea due to short gut syndrome limiting lactulose use.   Continue lactulose, rifaximin, supportive care PT OT.  Monitoring ammonia level however son has been informed it will not correlate with mental status.  PT OT rec home health PT OT Liver cirrhosis compensated.  Continue home rifaximin, holding Aldactone and Lasix 2/2 aki> will plan to resume in the morning. She will need  fllow-up with GI at [facility].   AKI: Creatinine elevated 1.8 on admission, improving nicely after iv albumin, continue Oral hydration home aldactone and Lasix currently on hold Recent Labs    10/28/22 0344 10/29/22 0423 10/30/22 0430 11/30/22 1805 12/01/22 0551 12/02/22 0600 12/04/22 0831 12/10/22 1148 12/11/22 0455 12/12/22 0507  BUN 20 22 21 19 20 19 15  28* 25* 21  CREATININE 1.07* 1.17* 1.12* 1.18* 1.10* 1.04* 1.00 1.81* 1.34* 1.15*  CO2 18* 19* 20* 22 21* 22 23 21* 20* 17*  K 3.5 3.2* 3.3* 3.3* 3.6 3.9 4.0 4.0 3.4* 3.6    Deconditioning/debility/weakness: Weakness improving slowly continue PT OT   Vaginal bleeding: 11-30-2022  Patient developed vaginal bleeding during her hospitalization for ESBL UTI and bacteremia in September.  She has been on Megace 40mg  BID.  -She had follow-up with GYN on 10/30/2022 and still awaiting appointment to undergo endometrial biopsy and Pap smear  and to continue Megace until further evaluation.No recent vaginal bleeding   Hypokalemia: Resolved cont K-Dur 20 twice daily   Short gut syndrome with chronic diarrhea: This limits her lactulose use  Hypotension: BP stable continue home midodrine    Hypothyroidism: Stable on  home Synthroid.     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  Currently no evidence of infection  Goals of care: Patient is DNR confirmed with patient's son, they are aware about palliative care and was previously seen.  Overall prognosis is poor, at risk for recurrent hospitalization decompensation   Morbid obesity:Patient's Body mass index is 40.36 kg/m. : Will benefit with PCP follow-up, weight loss  healthy lifestyle and outpatient sleep evaluation.   DVT prophylaxis: SCDs Start: 12/10/22 1422 Code Status:   Code Status: Limited: Do not attempt resuscitation (DNR) -DNR-LIMITED -Do Not Intubate/DNI  Family Communication: plan of care discussed with patient/son at bedside. Patient status is:  admitted as observation but remains hospitalized for ongoing  because of AKI Level of care: Progressive   Dispo: The patient is from: home            Anticipated disposition: HOMe likely tomorrow if mental status improves   Objective: Vitals last 24 hrs: Vitals:   12/11/22 0459 12/11/22 1401 12/11/22 2053 12/12/22 0500  BP: 114/66 127/61 123/62 115/75  Pulse: 78 76 90 75  Resp: 17 (!) 22 18   Temp: 98.5 F (36.9 C) 98.9 F (37.2 C) (!) 97.5 F (36.4 C) 98.5 F (36.9 C)  TempSrc: Oral Oral Oral Oral  SpO2: 97% 99% 100%   Weight:      Height:       Weight change:   Physical Examination: General exam: alert awake, oriented to people place HEENT:Oral mucosa moist, Ear/Nose WNL grossly Respiratory system: Bilaterally clear BS,no use of accessory muscle Cardiovascular system: S1 & S2 +, No JVD. Gastrointestinal system: Abdomen soft,NT,ND, BS+ Nervous System: Alert, awake, moving all extremities,and following commands. Extremities: LE edema neg,distal peripheral pulses palpable and warm.  Skin: No rashes,no icterus. MSK: Normal muscle bulk,tone,  power  Medications reviewed:  Scheduled Meds:  [START ON 12/16/2022] cyanocobalamin  2,000 mcg Oral Q Fri   famotidine  20 mg Oral Daily   folic acid  1 mg Oral Daily   lactulose  20 g Oral TID   levothyroxine  150 mcg Oral QAC breakfast   megestrol  40 mg Oral BID   pantoprazole  40 mg Oral BID AC   potassium chloride  20 mEq Oral BID   rifaximin  550 mg Oral BID   rOPINIRole  0.25 mg Oral Daily   tamsulosin  0.4 mg Oral Daily   venlafaxine XR  75 mg Oral Q breakfast  Continuous Infusions:   Diet Order             Diet Heart Fluid consistency: Thin  Diet effective now                  Intake/Output Summary (Last 24 hours) at 12/12/2022 1102 Last data filed at 12/12/2022 0800 Gross per 24 hour  Intake 240 ml  Output --  Net 240 ml   Net IO Since Admission: 1,440 mL [12/12/22 1102]  Wt Readings from Last 3 Encounters:  12/10/22 100.1 kg  10/22/22 100.1 kg  10/10/22 100.7 kg     Unresulted Labs (From admission,  onward)     Start     Ordered   12/11/22 0500  Ammonia  Daily,   R      12/10/22 1420          Data Reviewed: I have personally reviewed following labs and imaging studies CBC: Recent Labs  Lab 12/10/22 1148 12/11/22 0455 12/12/22 0507  WBC 6.1 4.4 4.8  HGB 9.8* 8.0* 8.3*  HCT 31.9* 26.4* 28.0*  MCV 94.4 95.7 98.6  PLT 139* 115* 112*   Basic Metabolic Panel:  Recent Labs  Lab 12/10/22 1148 12/11/22 0455 12/12/22 0507  NA 138 140 141  K 4.0 3.4* 3.6  CL 109 114* 118*  CO2 21* 20* 17*  GLUCOSE 112* 111* 111*  BUN 28* 25* 21  CREATININE 1.81* 1.34* 1.15*  CALCIUM 8.8* 8.5* 8.6*  MG 2.3  --   --    GFR: Estimated Creatinine Clearance: 54 mL/min (A) (by C-G formula based on SCr of 1.15 mg/dL (H)). Liver Function Tests:  Recent Labs  Lab 12/10/22 1148 12/11/22 0455  AST 37 27  ALT 22 18  ALKPHOS 96 75  BILITOT 1.4* 1.6*  PROT 6.2* 5.4*  ALBUMIN 2.7* 2.9*   Recent Labs  Lab 12/10/22 1148  LIPASE 53*    Recent Labs  Lab  12/10/22 1148 12/11/22 0455 12/12/22 0507  AMMONIA 159* 102* 109*   Coagulation Profile:  Recent Labs  Lab 12/10/22 1148  INR 1.6*   Recent Labs  Lab 12/10/22 1049  GLUCAP 112*   No results found for this or any previous visit (from the past 240 hour(s)).  Antimicrobials: Anti-infectives (From admission, onward)    Start     Dose/Rate Route Frequency Ordered Stop   12/10/22 2200  rifaximin (XIFAXAN) tablet 550 mg        550 mg Oral 2 times daily 12/10/22 1718        Culture/Microbiology    Component Value Date/Time   SDES  10/21/2022 1923    BLOOD BLOOD RIGHT HAND Performed at [facility], [address], [place], [place] [address]    SDES  10/21/2022 1923    BLOOD BLOOD RIGHT HAND Performed at [facility], [address], [place], [place] [address]    SPECREQUEST  10/21/2022 1923    BOTTLES DRAWN AEROBIC ONLY Blood Culture adequate volume Performed at [facility], [address], [place], [place] [address]    SPECREQUEST  10/21/2022 1923    BOTTLES DRAWN AEROBIC ONLY Blood Culture adequate volume Performed at [name], [address] [place], [place] [address]    CULT  10/21/2022 1923    NO GROWTH 5 DAYS Performed at [facility], [address], [place], [place] [address]    CULT  10/21/2022 1923    NO GROWTH 5 DAYS Performed at [facility], [address], [place], [place] [address]    REPTSTATUS 10/26/2022 FINAL 10/21/2022 1923   REPTSTATUS 10/26/2022 FINAL 10/21/2022 1923  Radiology Studies: No results found.   LOS: 0 days  Total time spent in review of labs and imaging, patient evaluation, formulation of plan, documentation and communication with family: 35 minutes  Lanae Boast, MD [facility]  12/12/2022, 11:02 AM

## 2022-12-12 NOTE — Plan of Care (Signed)
  Problem: Health Behavior/Discharge Planning: Goal: Ability to manage health-related needs will improve Outcome: Progressing   Problem: Activity: Goal: Risk for activity intolerance will decrease Outcome: Progressing   Problem: Nutrition: Goal: Adequate nutrition will be maintained Outcome: Progressing   Problem: Coping: Goal: Level of anxiety will decrease Outcome: Progressing   Problem: Pain Management: Goal: General experience of comfort will improve Outcome: Progressing

## 2022-12-13 DIAGNOSIS — K7682 Hepatic encephalopathy: Secondary | ICD-10-CM | POA: Diagnosis not present

## 2022-12-13 LAB — AMMONIA: Ammonia: 108 umol/L — ABNORMAL HIGH (ref 9–35)

## 2022-12-13 MED ORDER — FUROSEMIDE 40 MG PO TABS
40.0000 mg | ORAL_TABLET | Freq: Every day | ORAL | Status: DC
  Administered 2022-12-13 – 2022-12-15 (×3): 40 mg via ORAL
  Filled 2022-12-13 (×3): qty 1

## 2022-12-13 MED ORDER — SPIRONOLACTONE 25 MG PO TABS
25.0000 mg | ORAL_TABLET | Freq: Every day | ORAL | Status: DC
  Administered 2022-12-13 – 2022-12-15 (×3): 25 mg via ORAL
  Filled 2022-12-13 (×3): qty 1

## 2022-12-13 NOTE — Progress Notes (Signed)
Mobility Specialist - Progress Note   12/13/22 1357  Mobility  Activity Ambulated with assistance in hallway  Level of Assistance Standby assist, set-up cues, supervision of patient - no hands on  Assistive Device Front wheel walker  Distance Ambulated (ft) 230 ft  Activity Response Tolerated well  Mobility Referral Yes  $Mobility charge 1 Mobility  Mobility Specialist Start Time (ACUTE ONLY) 1342  Mobility Specialist Stop Time (ACUTE ONLY) 1357  Mobility Specialist Time Calculation (min) (ACUTE ONLY) 15 min   Pt received in bed and agreeable to mobility. No complaints during session. Pt to bed after session with all needs met. Bed alarm on.   [facility]

## 2022-12-13 NOTE — Progress Notes (Signed)
PROGRESS NOTE [name]  ZOX:[id number] DOB: [birth year] DOA: 12/10/2022 PCP: [name], MD  Brief Narrative/Hospital Course:  67 y.o. female with medical history significant for NASH w/ cirrhosis, chronic diarrhea, hypertension, obesity, PE/DVT, short gut syndrome, hypothyroidism,atherosclerosis, depression, hypertension, cholelithiasis recent admission from 11/30/2022 until 12/05/2022 due to acute hepatic encephalopathy who is returning to the emergency department by her son due to recurrence of confusion since 12/09/22.  She has had recurrent admission for hepatic encephalopathy, back in October, had a kyphoplasty around same time previously back in September had Pseudomonas UTI and bacteremia.  Son brought her in because she was more confused and had less bowel movement. Son gives her Xifaxan daily and only gives her Lactulose as needed at the instruction of GI because she has chronic diarrhea. If she took Lactulose daily she would have at least 6 bowel movements and get dehydrated. Son has a special word he ask her and if she can't give that word or if she talks slow those would be signs he looks for to give her lactulose.  She had 6 bowel movements prior to coming to ED and had a large 1 in the ED. In the ED vitals stable afebrile, labs showed creatinine elevated 1.8 from baseline 1.0 lipase 53 normal LFTs, CBC chronic anemia and thrombocytopenia ammonia at 159.  EKG bacteria rare squamous epithelial 11-23, WBC 0-5 Patient is admitted for acute hepatic encephalopathy.  Being managed with lactulose 20 g 3 times daily, rifaximin.  Seen by PT OT regularly at this time she is mobilizing well mentation overall stable.  Although ammonia remains elevated at 100 her mental status is improved   Subjective:  Seen this am Patient does not feel comfortable going home today she still feels weak Son does not feel comfortable taking her home she is high risk for readmission Had BM 3 already today    Assessment and Plan: Principal Problem:   Acute hepatic encephalopathy (HCC) Active Problems:   Cirrhosis due to NASH   Iron deficiency anemia   GERD (gastroesophageal reflux disease)   Thrombocytopenia (HCC)   Hypothyroidism   Morbid obesity (HCC)   Folate deficiency   AKI (acute kidney injury) (HCC)   Depression, major, recurrent, moderate (HCC)   Grade I diastolic dysfunction   Acute hepatic encephalopathy, recurrent NASH liver cirrhosis: Appears weak and deconditioned mental status significantly improved and at baseline. She has chronic diarrhea due to short gut syndrome limiting lactulose use. Continue lactulose, rifaximin, and titrate lactulose for BM  Cont supportive care PT OT. Monitoring ammonia level however son has been informed it will not correlate with mental status.  Liver cirrhosis compensated.  Resume Aldactone and Lasix today and watch creatinine in am  AKI: Creatinine elevated 1.8 on admission, improving nicely after iv albumin, continue Oral hydration- resume aldactone and Lasix  and repeat labs in the morning  Recent Labs    10/28/22 0344 10/29/22 0423 10/30/22 0430 11/30/22 1805 12/01/22 0551 12/02/22 0600 12/04/22 0831 12/10/22 1148 12/11/22 0455 12/12/22 0507  BUN 20 22 21 19 20 19 15  28* 25* 21  CREATININE 1.07* 1.17* 1.12* 1.18* 1.10* 1.04* 1.00 1.81* 1.34* 1.15*  CO2 18* 19* 20* 22 21* 22 23 21* 20* 17*  K 3.5 3.2* 3.3* 3.3* 3.6 3.9 4.0 4.0 3.4* 3.6    Deconditioning/debility/weakness: Weakness improving slowly continue PT OT -recommending home with home health  Vaginal bleeding: 11-30-2022  Patient developed vaginal bleeding during her hospitalization for ESBL UTI and bacteremia in September.  She  has been on Megace 40mg  BID.  -She had follow-up with GYN on 10/30/2022 and still awaiting appointment to undergo endometrial biopsy and Pap smear and to continue Megace until further evaluation.No recent vaginal bleeding   Hypokalemia: Resolved  cont K-Dur 20 twice daily   Short gut syndrome with chronic diarrhea: This limits her lactulose use  Hypotension: BP stable continue home midodrine    Hypothyroidism: Stable on home Synthroid.     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  Currently no evidence of infection  Goals of care: Patient is DNR confirmed with patient's son, they are aware about palliative care and was previously seen.  Overall prognosis is poor, at risk for recurrent hospitalization decompensation   Morbid obesity:Patient's Body mass index is 40.36 kg/m. : Will benefit with PCP follow-up, weight loss  healthy lifestyle and outpatient sleep evaluation.   DVT prophylaxis: SCDs Start: 12/10/22 1422 Code Status:   Code Status: Limited: Do not attempt resuscitation (DNR) -DNR-LIMITED -Do Not Intubate/DNI  Family Communication: plan of care discussed with patient/son at bedside. Patient status is:  admitted as observation but remains hospitalized for ongoing  because of AKI Level of care: Progressive   Dispo: The patient is from: home            Anticipated disposition: Anticipate discharge home tomorrow, they do not feel comfortable going home today, she is high risk for readmissions decompensation.    Objective: Vitals last 24 hrs: Vitals:   12/12/22 0500 12/12/22 0607 12/12/22 1339 12/13/22 0557  BP: 115/75 (!) 123/58 133/68 127/72  Pulse: 75 76 75 73  Resp:  18 (!) 22 20  Temp: 98.5 F (36.9 C) 99 F (37.2 C) 98.7 F (37.1 C) 98.7 F (37.1 C)  TempSrc: Oral Oral Oral Oral  SpO2:  100% 100% 100%  Weight:      Height:       Weight change:   Physical Examination: General exam: alert awake HEENT:Oral mucosa moist, Ear/Nose WNL grossly Respiratory system: Bilaterally clear BS,no use of accessory muscle Cardiovascular system: S1 & S2 +, No  JVD. Gastrointestinal system: Abdomen soft,NT,ND, BS+ Nervous System: Alert, awake, moving all extremities,and following commands. Extremities: LE edema neg,distal peripheral pulses palpable and warm.  Skin: No rashes,no icterus. MSK: Normal muscle bulk,tone, power   Medications reviewed:  Scheduled Meds:  [START ON 12/16/2022] cyanocobalamin  2,000 mcg Oral Q Fri   famotidine  20 mg Oral Daily   folic acid  1 mg Oral Daily   furosemide  40 mg Oral Daily   lactulose  20 g Oral TID   levothyroxine  150 mcg Oral QAC breakfast   megestrol  40 mg Oral BID   pantoprazole  40 mg Oral BID AC   potassium chloride  20 mEq Oral BID   rifaximin  550 mg Oral BID   rOPINIRole  0.25 mg Oral Daily   spironolactone  25 mg Oral Daily   tamsulosin  0.4 mg Oral Daily   venlafaxine XR  75 mg Oral Q breakfast  Continuous Infusions:   Diet Order             Diet Heart Fluid consistency: Thin  Diet effective now                 No intake or output data in the 24 hours ending 12/13/22 1416  Net IO Since  Admission: 1,180 mL [12/13/22 1416]  Wt Readings from Last 3 Encounters:  12/10/22 100.1 kg  10/22/22 100.1 kg  10/10/22 100.7 kg     Unresulted Labs (From admission, onward)     Start     Ordered   12/14/22 0500  Basic metabolic panel  Tomorrow morning,   R       Question:  Specimen collection method  Answer:  Lab=Lab collect   12/13/22 1416   12/14/22 0500  CBC  Tomorrow morning,   R       Question:  Specimen collection method  Answer:  Lab=Lab collect   12/13/22 1416   12/14/22 0500  Magnesium  Tomorrow morning,   R       Question:  Specimen collection method  Answer:  Lab=Lab collect   12/13/22 1416   12/11/22 0500  Ammonia  Daily,   R      12/10/22 1420          Data Reviewed: I have personally reviewed following labs and imaging studies CBC: Recent Labs  Lab 12/10/22 1148 12/11/22 0455 12/12/22 0507  WBC 6.1 4.4 4.8  HGB 9.8* 8.0* 8.3*  HCT 31.9* 26.4* 28.0*  MCV  94.4 95.7 98.6  PLT 139* 115* 112*   Basic Metabolic Panel:  Recent Labs  Lab 12/10/22 1148 12/11/22 0455 12/12/22 0507  NA 138 140 141  K 4.0 3.4* 3.6  CL 109 114* 118*  CO2 21* 20* 17*  GLUCOSE 112* 111* 111*  BUN 28* 25* 21  CREATININE 1.81* 1.34* 1.15*  CALCIUM 8.8* 8.5* 8.6*  MG 2.3  --   --    GFR: Estimated Creatinine Clearance: 54 mL/min (A) (by C-G formula based on SCr of 1.15 mg/dL (H)). Liver Function Tests:  Recent Labs  Lab 12/10/22 1148 12/11/22 0455  AST 37 27  ALT 22 18  ALKPHOS 96 75  BILITOT 1.4* 1.6*  PROT 6.2* 5.4*  ALBUMIN 2.7* 2.9*   Recent Labs  Lab 12/10/22 1148  LIPASE 53*    Recent Labs  Lab 12/10/22 1148 12/11/22 0455 12/12/22 0507 12/13/22 0515  AMMONIA 159* 102* 109* 108*   Coagulation Profile:  Recent Labs  Lab 12/10/22 1148  INR 1.6*   Recent Labs  Lab 12/10/22 1049  GLUCAP 112*   No results found for this or any previous visit (from the past 240 hour(s)).  Antimicrobials: Anti-infectives (From admission, onward)    Start     Dose/Rate Route Frequency Ordered Stop   12/10/22 2200  rifaximin (XIFAXAN) tablet 550 mg        550 mg Oral 2 times daily 12/10/22 1718        Culture/Microbiology    Component Value Date/Time   SDES  10/21/2022 1923    BLOOD BLOOD RIGHT HAND Performed at [facility], [address] [place], [place] [address]    SDES  10/21/2022 1923    BLOOD BLOOD RIGHT HAND Performed at [facility], [address] [place], [place] [address]    SPECREQUEST  10/21/2022 1923    BOTTLES DRAWN AEROBIC ONLY Blood Culture adequate volume Performed at [facility], [address], [place], [place] [address]    SPECREQUEST  10/21/2022 1923    BOTTLES DRAWN AEROBIC ONLY Blood Culture adequate volume Performed at [facility], [address], [place], [place] [address]    CULT  10/21/2022 1923    NO GROWTH 5 DAYS Performed at  [facility], [address] [place], [place] [address]    CULT  10/21/2022 1923    NO GROWTH 5 DAYS Performed at [facility], [address], [place], [place] [address]    REPTSTATUS 10/26/2022 FINAL 10/21/2022 1923   REPTSTATUS 10/26/2022 FINAL 10/21/2022 1923  Radiology Studies: No results found.   LOS: 0 days  Total time spent in review of labs and imaging, patient evaluation, formulation of plan, documentation and communication with family: 35 minutes  Lanae Boast, MD [facility]  12/13/2022, 2:16 PM

## 2022-12-13 NOTE — Progress Notes (Signed)
PT Cancellation Note  Patient Details Name: [name] MRN: [id number] DOB: 1955/05/04   Cancelled Treatment:    Reason Eval/Treat Not Completed: Fatigue/lethargy limiting ability to participate (pt sleeping soundly, son requested PT attempt later as pt slept poorly last night. Will follow.)  Tamala Ser PT 12/13/2022  Acute Rehabilitation Services  Office [phone]

## 2022-12-14 DIAGNOSIS — K7682 Hepatic encephalopathy: Secondary | ICD-10-CM | POA: Diagnosis not present

## 2022-12-14 LAB — CBC
HCT: 30.6 % — ABNORMAL LOW (ref 36.0–46.0)
Hemoglobin: 9.2 g/dL — ABNORMAL LOW (ref 12.0–15.0)
MCH: 29 pg (ref 26.0–34.0)
MCHC: 30.1 g/dL (ref 30.0–36.0)
MCV: 96.5 fL (ref 80.0–100.0)
Platelets: 126 10*3/uL — ABNORMAL LOW (ref 150–400)
RBC: 3.17 MIL/uL — ABNORMAL LOW (ref 3.87–5.11)
RDW: 16.5 % — ABNORMAL HIGH (ref 11.5–15.5)
WBC: 5.7 10*3/uL (ref 4.0–10.5)
nRBC: 0 % (ref 0.0–0.2)

## 2022-12-14 LAB — BASIC METABOLIC PANEL
Anion gap: 4 — ABNORMAL LOW (ref 5–15)
BUN: 17 mg/dL (ref 8–23)
CO2: 19 mmol/L — ABNORMAL LOW (ref 22–32)
Calcium: 8.7 mg/dL — ABNORMAL LOW (ref 8.9–10.3)
Chloride: 117 mmol/L — ABNORMAL HIGH (ref 98–111)
Creatinine, Ser: 0.89 mg/dL (ref 0.44–1.00)
GFR, Estimated: >60 mL/min (ref >60)
Glucose, Bld: 107 mg/dL — ABNORMAL HIGH (ref 70–99)
Potassium: 4.1 mmol/L (ref 3.5–5.1)
Sodium: 140 mmol/L (ref 135–145)

## 2022-12-14 LAB — AMMONIA: Ammonia: 108 umol/L — ABNORMAL HIGH (ref 9–35)

## 2022-12-14 LAB — MAGNESIUM: Magnesium: 1.8 mg/dL (ref 1.7–2.4)

## 2022-12-14 NOTE — Progress Notes (Signed)
Physical Therapy Treatment Patient Details Name: [name] MRN: [id number] DOB: [birth year] Today's Date: 12/14/2022   History of Present Illness 67 year old female with PMH of NASH cirrhosis, chronic diarrhea, short gut syndrome, PE/DVT not on AC, morbid obesity, HTN, systolic murmur, hypothyroidism and chronic hypotension presenting with altered mental status and admitted with working diagnosis of  acute hepatic encephalopathy in setting of Cirrhosis    PT Comments  General Comments: AxO x 3 VERY pleasant [facility] familiar from prior admits.  Memorable Son [name] who proves excellent care for his Mom and also travels with her.  Most recent trip was [place]. Assisted OOB to mab in hallway went well.  Pt self able to transfer to EOB and rise with walker.  Pt tolerated a functional distance of 65 feet.    Pt plans to D/C to home with Son support.    If plan is discharge home, recommend the following: A little help with walking and/or transfers;A little help with bathing/dressing/bathroom;Assistance with cooking/housework;Assist for transportation;Help with stairs or ramp for entrance   Can travel by private vehicle        Equipment Recommendations  None recommended by PT    Recommendations for Other Services       Precautions / Restrictions Precautions Precautions: Fall Restrictions Weight Bearing Restrictions: No     Mobility  Bed Mobility Overal bed mobility: Needs Assistance Bed Mobility: Supine to Sit     Supine to sit: Supervision     General bed mobility comments: Increased time with use of bed rails but self able    Transfers Overall transfer level: Needs assistance Equipment used: Rolling walker (2 wheels) Transfers: Sit to/from Stand Sit to Stand: Supervision, Contact guard assist           General transfer comment: uses for momentum but able to self rise.    Ambulation/Gait Ambulation/Gait assistance: Contact guard assist, Min assist Gait Distance  (Feet): 65 Feet Assistive device: Rolling walker (2 wheels) Gait Pattern/deviations: Step-to pattern, Decreased step length - right, Decreased step length - left, Shuffle, Trunk flexed, Knees buckling Gait velocity: decreased     General Gait Details: tolerated an increased distance using a walker with mild c/o fatigue.  "I feel good", stated pt.   Stairs             Wheelchair Mobility     Tilt Bed    Modified Rankin (Stroke Patients Only)       Balance                                            Cognition Arousal: Alert   Overall Cognitive Status: Within Functional Limits for tasks assessed                                 General Comments: AxO x 3 VERY pleasant [facility] familiar from prior admits.  Memorable Son [name] who proves excellent care for his Mom and also travels with her.  Most recent trip was [place].        Exercises      General Comments        Pertinent Vitals/Pain Pain Assessment Pain Assessment: No/denies pain    Home Living  Prior Function            PT Goals (current goals can now be found in the care plan section) Progress towards PT goals: Progressing toward goals    Frequency    Min 1X/week      PT Plan      Co-evaluation              AM-PAC PT "6 Clicks" Mobility   Outcome Measure  Help needed turning from your back to your side while in a flat bed without using bedrails?: None Help needed moving from lying on your back to sitting on the side of a flat bed without using bedrails?: None Help needed moving to and from a bed to a chair (including a wheelchair)?: None Help needed standing up from a chair using your arms (e.g., wheelchair or bedside chair)?: A Little Help needed to walk in hospital room?: A Little Help needed climbing 3-5 steps with a railing? : A Lot 6 Click Score: 20    End of Session Equipment Utilized During Treatment:  Gait belt Activity Tolerance: Patient tolerated treatment well Patient left: in chair;with call bell/phone within reach;with chair alarm set Nurse Communication: Mobility status PT Visit Diagnosis: Unsteadiness on feet (R26.81);Muscle weakness (generalized) (M62.81);Difficulty in walking, not elsewhere classified (R26.2);Pain     Time: 1601-0932 PT Time Calculation (min) (ACUTE ONLY): 28 min  Charges:    $Gait Training: 8-22 mins $Therapeutic Activity: 8-22 mins PT General Charges $$ ACUTE PT VISIT: 1 Visit                     Felecia Shelling  PTA Acute  Rehabilitation Services Office M-F          [phone]

## 2022-12-14 NOTE — Plan of Care (Signed)
  Problem: Education: Goal: Knowledge of General Education information will improve Description: Including pain rating scale, medication(s)/side effects and non-pharmacologic comfort measures Outcome: Progressing   Problem: Nutrition: Goal: Adequate nutrition will be maintained Outcome: Progressing   Problem: Coping: Goal: Level of anxiety will decrease Outcome: Progressing   Problem: Elimination: Goal: Will not experience complications related to bowel motility Outcome: Progressing   Problem: Pain Management: Goal: General experience of comfort will improve Outcome: Progressing

## 2022-12-14 NOTE — Plan of Care (Signed)
  Problem: Pain Management: Goal: General experience of comfort will improve Outcome: Progressing   Problem: Safety: Goal: Ability to remain free from injury will improve Outcome: Progressing   Problem: Skin Integrity: Goal: Risk for impaired skin integrity will decrease Outcome: Progressing

## 2022-12-14 NOTE — Progress Notes (Signed)
PROGRESS NOTE [name]  JXB:[id number] DOB: 1955/12/17 DOA: 12/10/2022 PCP: [name], MD  Brief Narrative/Hospital Course:  67 y.o. female with medical history significant for NASH w/ cirrhosis, chronic diarrhea, hypertension, obesity, PE/DVT, short gut syndrome, hypothyroidism,atherosclerosis, depression, hypertension, cholelithiasis recent admission from 11/30/2022 until 12/05/2022 due to acute hepatic encephalopathy who is returning to the emergency department by her son due to recurrence of confusion since 12/09/22.  She has had recurrent admission for hepatic encephalopathy, back in October, had a kyphoplasty around same time previously back in September had Pseudomonas UTI and bacteremia.  Son brought her in because she was more confused and had less bowel movement. Son gives her Xifaxan daily and only gives her Lactulose as needed at the instruction of GI because she has chronic diarrhea. If she took Lactulose daily she would have at least 6 bowel movements and get dehydrated. Son has a special word he ask her and if she can't give that word or if she talks slow those would be signs he looks for to give her lactulose.  She had 6 bowel movements prior to coming to ED and had a large 1 in the ED. In the ED vitals stable afebrile, labs showed creatinine elevated 1.8 from baseline 1.0 lipase 53 normal LFTs, CBC chronic anemia and thrombocytopenia ammonia at 159.  EKG bacteria rare squamous epithelial 11-23, WBC 0-5 Patient is admitted for acute hepatic encephalopathy.  Being managed with lactulose 20 g 3 times daily, rifaximin.  Seen by PT OT regularly at this time she is mobilizing well mentation overall stable.  Although ammonia remains elevated at 100 her mental status is improved.  Patient remains overall stable at this point  Subjective: Seen and examined Patient reports he still feels " goofy" son is at the bedside and again are not comfortable going home today Had BM  x 6 on 12/3  and 8 on 12/2   Assessment and Plan: Principal Problem:   Acute hepatic encephalopathy (HCC) Active Problems:   Cirrhosis due to NASH   Iron deficiency anemia   GERD (gastroesophageal reflux disease)   Thrombocytopenia (HCC)   Hypothyroidism   Morbid obesity (HCC)   Folate deficiency   AKI (acute kidney injury) (HCC)   Depression, major, recurrent, moderate (HCC)   Grade I diastolic dysfunction   Acute hepatic encephalopathy, recurrent NASH liver cirrhosis: Appears weak and deconditioned mental status has improved however patient son and patient feels she is still bit confused and weak.She has chronic diarrhea due to short gut syndrome limiting lactulose use but having 6-8 Bms Continue lactulose, rifaximin. Cont supportive care PT OT. Monitoring ammonia level however son has been informed it will not correlate with mental status level in 100s- he feels it has been as low as 30s and she will be better if < 60.  Her ;iver cirrhosis compensated cont on aldactone and Lasix and watching creatinine in am  AKI: Creatinine elevated 1.8 on admission, improving nicely after iv albumin, continue Oral hydration- resumed aldactone and Lasix  12/3 monitor renal function and doing well Recent Labs    10/29/22 0423 10/30/22 0430 11/30/22 1805 12/01/22 0551 12/02/22 0600 12/04/22 0831 12/10/22 1148 12/11/22 0455 12/12/22 0507 12/14/22 0528  BUN 22 21 19 20 19 15  28* 25* 21 17  CREATININE 1.17* 1.12* 1.18* 1.10* 1.04* 1.00 1.81* 1.34* 1.15* 0.89  CO2 19* 20* 22 21* 22 23 21* 20* 17* 19*  K 3.2* 3.3* 3.3* 3.6 3.9 4.0 4.0 3.4* 3.6 4.1  Deconditioning/debility/weakness: Weakness improving slowly continue PT OT -recommending home with home health  Vaginal bleeding: 11-30-2022  Patient developed vaginal bleeding during her hospitalization for ESBL UTI and bacteremia in September.  She has been on Megace 40mg  BID.  -She had follow-up with GYN on 10/30/2022 and still awaiting appointment to  undergo endometrial biopsy and Pap smear and to continue Megace until further evaluation.No recent vaginal bleeding   Hypokalemia: Resolved cont K-Dur 20 twice daily   Short gut syndrome with chronic diarrhea: This limits her lactulose use, no issues  Hypotension: BP stable continue home midodrine    Hypothyroidism: Stable on home Synthroid.     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  Currently no evidence of infection  Goals of care: Patient is DNR confirmed with patient's son, they are aware about palliative care and was previously seen.  Overall prognosis is poor, at risk for recurrent hospitalization decompensation   Morbid obesity:Patient's Body mass index is 40.36 kg/m. : Will benefit with PCP follow-up, weight loss  healthy lifestyle and outpatient sleep evaluation.   DVT prophylaxis: SCDs Start: 12/10/22 1422 Code Status:   Code Status: Limited: Do not attempt resuscitation (DNR) -DNR-LIMITED -Do Not Intubate/DNI  Family Communication: plan of care discussed with patient/son at bedside. Patient status is:  admitted as observation but remains hospitalized for ongoing  because of AKI Level of care: Telemetry   Dispo: The patient is from: home            Anticipated disposition: Anticipate discharge home once mental status improves and patient and son comfortable. They do not feel comfortable going home today, she is high risk for readmissions decompensation.    Objective: Vitals last 24 hrs: Vitals:   12/13/22 0557 12/13/22 1456 12/13/22 2100 12/14/22 0535  BP: 127/72 138/66 126/60 124/68  Pulse: 73 73 70 76  Resp: 20 16 20 20   Temp: 98.7 F (37.1 C) (!) 97.4 F (36.3 C) 98.4 F (36.9 C) 98.2 F (36.8 C)  TempSrc: Oral Oral Oral Oral  SpO2: 100% 100% 100% 99%  Weight:      Height:       Weight change:   Physical  Examination: General exam: alert awake, oriented , ill appearing HEENT:Oral mucosa moist, Ear/Nose WNL grossly Respiratory system: Bilaterally clear BS,no use of accessory muscle Cardiovascular system: S1 & S2 +, No JVD. Gastrointestinal system: Abdomen soft,NT,ND, BS+ Nervous System: Alert, awake, moving all extremities,and following commands. Extremities: LE edema neg,distal peripheral pulses palpable and warm.  Skin: No rashes,no icterus. MSK: Normal muscle bulk,tone, power   Medications reviewed:  Scheduled Meds:  [START ON 12/16/2022] cyanocobalamin  2,000 mcg Oral Q Fri   famotidine  20 mg Oral Daily   folic acid  1 mg Oral Daily   furosemide  40 mg Oral Daily   lactulose  20 g Oral TID   levothyroxine  150 mcg Oral QAC breakfast   megestrol  40 mg Oral BID   pantoprazole  40 mg Oral BID AC   potassium chloride SA  20 mEq Oral BID   rifaximin  550 mg Oral BID   rOPINIRole  0.25 mg Oral Daily   spironolactone  25 mg Oral Daily   tamsulosin  0.4 mg Oral Daily   venlafaxine XR  75 mg Oral Q breakfast  Continuous Infusions:   Diet Order  Diet Heart Fluid consistency: Thin  Diet effective now                  Intake/Output Summary (Last 24 hours) at 12/14/2022 1136 Last data filed at 12/14/2022 0900 Gross per 24 hour  Intake 120 ml  Output 1 ml  Net 119 ml    Net IO Since Admission: 1,299 mL [12/14/22 1136]  Wt Readings from Last 3 Encounters:  12/10/22 100.1 kg  10/22/22 100.1 kg  10/10/22 100.7 kg     Unresulted Labs (From admission, onward)     Start     Ordered   12/11/22 0500  Ammonia  Daily,   R      12/10/22 1420          Data Reviewed: I have personally reviewed following labs and imaging studies CBC: Recent Labs  Lab 12/10/22 1148 12/11/22 0455 12/12/22 0507 12/14/22 0528  WBC 6.1 4.4 4.8 5.7  HGB 9.8* 8.0* 8.3* 9.2*  HCT 31.9* 26.4* 28.0* 30.6*  MCV 94.4 95.7 98.6 96.5  PLT 139* 115* 112* 126*   Basic Metabolic Panel:   Recent Labs  Lab 12/10/22 1148 12/11/22 0455 12/12/22 0507 12/14/22 0528  NA 138 140 141 140  K 4.0 3.4* 3.6 4.1  CL 109 114* 118* 117*  CO2 21* 20* 17* 19*  GLUCOSE 112* 111* 111* 107*  BUN 28* 25* 21 17  CREATININE 1.81* 1.34* 1.15* 0.89  CALCIUM 8.8* 8.5* 8.6* 8.7*  MG 2.3  --   --  1.8   GFR: Estimated Creatinine Clearance: 69.7 mL/min (by C-G formula based on SCr of 0.89 mg/dL). Liver Function Tests:  Recent Labs  Lab 12/10/22 1148 12/11/22 0455  AST 37 27  ALT 22 18  ALKPHOS 96 75  BILITOT 1.4* 1.6*  PROT 6.2* 5.4*  ALBUMIN 2.7* 2.9*   Recent Labs  Lab 12/10/22 1148  LIPASE 53*    Recent Labs  Lab 12/10/22 1148 12/11/22 0455 12/12/22 0507 12/13/22 0515 12/14/22 0528  AMMONIA 159* 102* 109* 108* 108*   Coagulation Profile:  Recent Labs  Lab 12/10/22 1148  INR 1.6*   Recent Labs  Lab 12/10/22 1049  GLUCAP 112*   No results found for this or any previous visit (from the past 240 hour(s)).  Antimicrobials: Anti-infectives (From admission, onward)    Start     Dose/Rate Route Frequency Ordered Stop   12/10/22 2200  rifaximin (XIFAXAN) tablet 550 mg        550 mg Oral 2 times daily 12/10/22 1718        Culture/Microbiology    Component Value Date/Time   SDES  10/21/2022 1923    BLOOD BLOOD RIGHT HAND Performed at [facility], [address], [place], [place] [address]    SDES  10/21/2022 1923    BLOOD BLOOD RIGHT HAND Performed at [facility], [address], [place], [place] [address]    SPECREQUEST  10/21/2022 1923    BOTTLES DRAWN AEROBIC ONLY Blood Culture adequate volume Performed at [facility], [address], [place], [place] [address]    SPECREQUEST  10/21/2022 1923    BOTTLES DRAWN AEROBIC ONLY Blood Culture adequate volume Performed at [facility], [address], [place], [place] [address]    CULT  10/21/2022 1923    NO GROWTH 5  DAYS Performed at [facility], [address], [place], [place] [address]    CULT  10/21/2022 [phone]  NO GROWTH 5 DAYS Performed at [facility], [address] [place], [place] [address]    REPTSTATUS 10/26/2022 FINAL 10/21/2022 1923   REPTSTATUS 10/26/2022 FINAL 10/21/2022 1923  Radiology Studies: No results found.   LOS: 0 days  Total time spent in review of labs and imaging, patient evaluation, formulation of plan, documentation and communication with family: 35 minutes  Lanae Boast, MD [facility]  12/14/2022, 11:36 AM

## 2022-12-14 NOTE — Progress Notes (Signed)
Occupational Therapy Treatment Patient Details Name: [name] MRN: [id number] DOB: Jan 25, 1955 Today's Date: 12/14/2022   History of present illness 67 year old female with PMH of NASH cirrhosis, chronic diarrhea, short gut syndrome, PE/DVT not on AC, morbid obesity, HTN, systolic murmur, hypothyroidism and chronic hypotension presenting with altered mental status and admitted with working diagnosis of  acute hepatic encephalopathy in setting of cirrhosis   OT comments  Pt was motivated to participate in the therapy session. She was instructed on ADLs at bathroom level, specifically participating in toileting and standing grooming at sink level. She reported 9/10 back pain at rest and with activity, and she was noted to be with fatigue during progressive activity. Continue OT plan of care. Home health therapy recommended at discharge.       If plan is discharge home, recommend the following:  Help with stairs or ramp for entrance;Assistance with cooking/housework;Assist for transportation;A little help with walking and/or transfers;A little help with bathing/dressing/bathroom   Equipment Recommendations  None recommended by OT    Recommendations for Other Services      Precautions / Restrictions Precautions Precautions: Fall Restrictions Weight Bearing Restrictions: No       Mobility Bed Mobility Overal bed mobility: Needs Assistance Bed Mobility: Supine to Sit     Supine to sit: Supervision          Transfers Overall transfer level: Needs assistance Equipment used: Rolling walker (2 wheels)   Sit to Stand: Contact guard assist                     ADL either performed or assessed with clinical judgement   ADL Overall ADL's : Needs assistance/impaired Eating/Feeding: Independent;Bed level Eating/Feeding Details (indicate cue type and reason): Pt was observed drinking from a cup at bed level/ Grooming: Contact guard assist;Standing Grooming Details  (indicate cue type and reason): The pt performed face washing and hand washing in standing at sink level. She required intermittent use of RW vs. sink for support in standing, as well as min verbal cues to step closer to the sink and to demo upright posture while performing.                 Toilet Transfer: Contact guard assist;Regular Toilet;Grab bars;Ambulation;Rolling walker (2 wheels) Toilet Transfer Details (indicate cue type and reason): The pt ambulated to and from the bathroom in the room using a RW. She required min verbal cues for walker placement and use of grab bar during toilet transfer. Toileting- Clothing Manipulation and Hygiene: Minimal assistance;Sit to/from stand Toileting - Clothing Manipulation Details (indicate cue type and reason): The pt reported difficulty with performing posterior peri-hygiene after a bowel movement, and requested intial assist in this regard. OT instructed her on improved body positioning and placing 1 upper extremity on the grab bar for support while attempting hygiene. She was subsequently able to perform posterior hygiene in standing with CGA. She further required steadying assist in standing and min assist for clothing management.             Vision   Additional Comments: chronic L eye blindness   Perception     Praxis      Cognition Arousal: Alert Behavior During Therapy: WFL for tasks assessed/performed Overall Cognitive Status: Within Functional Limits for tasks assessed                            Pertinent Vitals/ Pain  Pain Assessment Pain Assessment: 0-10 Pain Score: 9  Pain Location: back Pain Intervention(s): Patient requesting pain meds-RN notified, Monitored during session         Frequency  Min 1X/week        Progress Toward Goals  OT Goals(current goals can now be found in the care plan section)  Progress towards OT goals: Progressing toward goals  Acute Rehab OT Goals OT Goal  Formulation: With patient Time For Goal Achievement: 12/25/22 Potential to Achieve Goals: Good  Plan         AM-PAC OT "6 Clicks" Daily Activity     Outcome Measure   Help from another person eating meals?: None Help from another person taking care of personal grooming?: A Little Help from another person toileting, which includes using toliet, bedpan, or urinal?: A Little Help from another person bathing (including washing, rinsing, drying)?: A Lot Help from another person to put on and taking off regular upper body clothing?: A Little Help from another person to put on and taking off regular lower body clothing?: A Little 6 Click Score: 18    End of Session Equipment Utilized During Treatment: Rolling walker (2 wheels)  OT Visit Diagnosis: Unsteadiness on feet (R26.81);Muscle weakness (generalized) (M62.81);Other abnormalities of gait and mobility (R26.89)   Activity Tolerance Other (comment) (Fair+ tolerance)   Patient Left in bed;with call bell/phone within reach;with family/visitor present   Nurse Communication Patient requests pain meds        Time: 1125-1150 OT Time Calculation (min): 25 min  Charges: OT General Charges $OT Visit: 1 Visit OT Treatments $Self Care/Home Management : 8-22 mins $Therapeutic Activity: 8-22 mins     Reuben Likes, OTR/L 12/14/2022, 1:21 PM

## 2022-12-15 ENCOUNTER — Ambulatory Visit: Payer: Medicare (Managed Care) | Admitting: Obstetrics and Gynecology

## 2022-12-15 DIAGNOSIS — E876 Hypokalemia: Secondary | ICD-10-CM | POA: Diagnosis not present

## 2022-12-15 DIAGNOSIS — Z87440 Personal history of urinary (tract) infections: Secondary | ICD-10-CM | POA: Diagnosis not present

## 2022-12-15 DIAGNOSIS — Z86718 Personal history of other venous thrombosis and embolism: Secondary | ICD-10-CM | POA: Diagnosis not present

## 2022-12-15 DIAGNOSIS — D509 Iron deficiency anemia, unspecified: Secondary | ICD-10-CM | POA: Diagnosis present

## 2022-12-15 DIAGNOSIS — D696 Thrombocytopenia, unspecified: Secondary | ICD-10-CM | POA: Diagnosis present

## 2022-12-15 DIAGNOSIS — K90829 Short bowel syndrome, unspecified: Secondary | ICD-10-CM | POA: Diagnosis present

## 2022-12-15 DIAGNOSIS — E538 Deficiency of other specified B group vitamins: Secondary | ICD-10-CM | POA: Diagnosis present

## 2022-12-15 DIAGNOSIS — N179 Acute kidney failure, unspecified: Secondary | ICD-10-CM | POA: Diagnosis present

## 2022-12-15 DIAGNOSIS — E860 Dehydration: Secondary | ICD-10-CM | POA: Diagnosis present

## 2022-12-15 DIAGNOSIS — Z6841 Body Mass Index (BMI) 40.0 and over, adult: Secondary | ICD-10-CM | POA: Diagnosis not present

## 2022-12-15 DIAGNOSIS — K219 Gastro-esophageal reflux disease without esophagitis: Secondary | ICD-10-CM | POA: Diagnosis present

## 2022-12-15 DIAGNOSIS — I10 Essential (primary) hypertension: Secondary | ICD-10-CM | POA: Diagnosis present

## 2022-12-15 DIAGNOSIS — Z87891 Personal history of nicotine dependence: Secondary | ICD-10-CM | POA: Diagnosis not present

## 2022-12-15 DIAGNOSIS — H409 Unspecified glaucoma: Secondary | ICD-10-CM | POA: Diagnosis present

## 2022-12-15 DIAGNOSIS — E039 Hypothyroidism, unspecified: Secondary | ICD-10-CM | POA: Diagnosis present

## 2022-12-15 DIAGNOSIS — F331 Major depressive disorder, recurrent, moderate: Secondary | ICD-10-CM | POA: Diagnosis present

## 2022-12-15 DIAGNOSIS — Z66 Do not resuscitate: Secondary | ICD-10-CM | POA: Diagnosis present

## 2022-12-15 DIAGNOSIS — Z79890 Hormone replacement therapy (postmenopausal): Secondary | ICD-10-CM | POA: Diagnosis not present

## 2022-12-15 DIAGNOSIS — R4182 Altered mental status, unspecified: Secondary | ICD-10-CM | POA: Diagnosis not present

## 2022-12-15 DIAGNOSIS — K7460 Unspecified cirrhosis of liver: Secondary | ICD-10-CM | POA: Diagnosis present

## 2022-12-15 DIAGNOSIS — K7581 Nonalcoholic steatohepatitis (NASH): Secondary | ICD-10-CM | POA: Diagnosis present

## 2022-12-15 DIAGNOSIS — K7682 Hepatic encephalopathy: Secondary | ICD-10-CM | POA: Diagnosis present

## 2022-12-15 DIAGNOSIS — Z79899 Other long term (current) drug therapy: Secondary | ICD-10-CM | POA: Diagnosis not present

## 2022-12-15 LAB — MAGNESIUM: Magnesium: 1.9 mg/dL (ref 1.7–2.4)

## 2022-12-15 LAB — BASIC METABOLIC PANEL
Anion gap: 5 (ref 5–15)
BUN: 16 mg/dL (ref 8–23)
CO2: 19 mmol/L — ABNORMAL LOW (ref 22–32)
Calcium: 8.6 mg/dL — ABNORMAL LOW (ref 8.9–10.3)
Chloride: 119 mmol/L — ABNORMAL HIGH (ref 98–111)
Creatinine, Ser: 0.83 mg/dL (ref 0.44–1.00)
GFR, Estimated: >60 mL/min (ref >60)
Glucose, Bld: 107 mg/dL — ABNORMAL HIGH (ref 70–99)
Potassium: 4.4 mmol/L (ref 3.5–5.1)
Sodium: 143 mmol/L (ref 135–145)

## 2022-12-15 LAB — AMMONIA: Ammonia: 85 umol/L — ABNORMAL HIGH (ref 9–35)

## 2022-12-15 MED ORDER — DICYCLOMINE HCL 10 MG PO CAPS
10.0000 mg | ORAL_CAPSULE | Freq: Three times a day (TID) | ORAL | Status: DC | PRN
  Administered 2022-12-15 – 2022-12-16 (×2): 10 mg via ORAL
  Filled 2022-12-15 (×3): qty 1

## 2022-12-15 MED ORDER — HYDROCODONE-ACETAMINOPHEN 5-325 MG PO TABS
1.0000 | ORAL_TABLET | Freq: Two times a day (BID) | ORAL | Status: DC | PRN
  Administered 2022-12-15 – 2022-12-16 (×4): 1 via ORAL
  Filled 2022-12-15 (×4): qty 1

## 2022-12-15 MED ORDER — METHOCARBAMOL 500 MG PO TABS
500.0000 mg | ORAL_TABLET | Freq: Every morning | ORAL | Status: DC
  Administered 2022-12-15 – 2022-12-16 (×2): 500 mg via ORAL
  Filled 2022-12-15 (×2): qty 1

## 2022-12-15 MED ORDER — MAGNESIUM SULFATE IN D5W 1-5 GM/100ML-% IV SOLN
1.0000 g | Freq: Once | INTRAVENOUS | Status: AC
  Administered 2022-12-15: 1 g via INTRAVENOUS
  Filled 2022-12-15: qty 100

## 2022-12-15 NOTE — Plan of Care (Signed)
  Problem: Activity: Goal: Risk for activity intolerance will decrease Outcome: Progressing   Problem: Nutrition: Goal: Adequate nutrition will be maintained Outcome: Progressing   Problem: Pain Management: Goal: General experience of comfort will improve Outcome: Progressing   Problem: Safety: Goal: Ability to remain free from injury will improve Outcome: Progressing   Problem: Skin Integrity: Goal: Risk for impaired skin integrity will decrease Outcome: Progressing

## 2022-12-15 NOTE — Progress Notes (Signed)
Mobility Specialist - Progress Note   12/15/22 1341  Mobility  Activity Ambulated with assistance in hallway;Transferred to/from [facility]  Level of Assistance Standby assist, set-up cues, supervision of patient - no hands on  Assistive Device Front wheel walker  Distance Ambulated (ft) 80 ft  Activity Response Tolerated well  Mobility Referral Yes  Mobility visit 1 Mobility  Mobility Specialist Start Time (ACUTE ONLY) 1318  Mobility Specialist Stop Time (ACUTE ONLY) 1339  Mobility Specialist Time Calculation (min) (ACUTE ONLY) 21 min   Pt received in bed and agreeable to mobility. Prior to ambulating, pt requested assistance to [facility] for BM. No complaints during session. Pt to bed after session with all needs met. Bed alarm on.     [facility]

## 2022-12-15 NOTE — TOC Progression Note (Signed)
Transition of Care [facility]) - Progression Note    Patient Details  Name: [name] MRN: [id number] Date of Birth: 1955/06/04  Transition of [facility]) CM/SW Contact  Sten Dematteo, Olegario Messier, RN Phone Number: 12/15/2022, 11:26 AM  Clinical Narrative: Spoke to [name] about d/c plans-all set up w/[facility]/OT;[employer] has already assisted w/rw concerns-delivered new rw to rm already. Has own transportation.  [name] has medical concerns he has addressed w/staff already.     Expected Discharge Plan: Home w Home Health Services Barriers to Discharge: Continued Medical Work up  Expected Discharge Plan and Services   Discharge Planning Services: CM Consult   Living arrangements for the past 2 months: Single Family Home                                       Social Determinants of Health (SDOH) Interventions SDOH Screenings   Food Insecurity: No Food Insecurity (12/11/2022)  Housing: Low Risk  (12/11/2022)  Transportation Needs: No Transportation Needs (12/11/2022)  Utilities: Not At Risk (12/11/2022)  Depression (PHQ2-9): Medium Risk (11/03/2022)  Tobacco Use: Medium Risk (12/10/2022)    Readmission Risk Interventions    10/19/2022   10:12 AM 08/15/2022    3:20 PM 05/18/2022   10:24 AM  Readmission Risk Prevention Plan  Transportation Screening Complete Complete Complete  PCP or Specialist Appt within 5-7 Days   Complete  Home Care Screening   Complete  Medication Review (RN CM)   Complete  Medication Review (RN Care Manager) Complete Complete   PCP or Specialist appointment within 3-5 days of discharge Complete Complete   HRI or Home Care Consult Complete Complete   SW Recovery Care/Counseling Consult Complete Complete   Palliative Care Screening Not Applicable Not Applicable   Skilled Nursing Facility Not Applicable Not Applicable

## 2022-12-15 NOTE — Plan of Care (Signed)

## 2022-12-15 NOTE — Progress Notes (Signed)
PROGRESS NOTE [name]  JYN:[id number] DOB: [birth year] DOA: 12/10/2022 PCP: [name], MD   Brief Narrative/Hospital Course:  67 y.o. female with medical history significant for NASH w/ cirrhosis, chronic diarrhea, hypertension, obesity, PE/DVT, short gut syndrome, hypothyroidism,atherosclerosis, depression, hypertension, cholelithiasis recent admission from 11/30/2022 until 12/05/2022 due to acute hepatic encephalopathy who is returning to the emergency department by her son due to recurrence of confusion since 12/09/22.  She has had recurrent admission for hepatic encephalopathy, back in October, had a kyphoplasty around same time previously back in September had Pseudomonas UTI and bacteremia. Son brought her in because she was more confused and had less bowel movement. Son gives her Xifaxan daily and only gives her Lactulose as needed at the instruction of GI because she has chronic diarrhea. If she took Lactulose daily she would have at least 6 bowel movements and get dehydrated. Son has a special word he ask her and if she can't give that word or if she talks slow those would be signs he looks for to give her lactulose.  She had 6 bowel movements prior to coming to ED and had a large 1 in the ED. In the ED vitals stable afebrile, labs showed creatinine elevated 1.8 from baseline 1.0 lipase 53 normal LFTs, CBC chronic anemia and thrombocytopenia ammonia at 159.  EKG bacteria rare squamous epithelial 11-23, WBC 0-5 Patient is admitted for acute hepatic encephalopathy.  Being managed with lactulose 20 g 3 times daily, rifaximin.  Seen by PT OT regularly at this time she is mobilizing.  Mentation fluctuating-son feels she is still fuzzy and weak not back to baseline yet. He believes when her ammonia level is 60 or less she will be better.  Subjective: Seen and examined Per son she is still "fuzzy" She c/o back pain chronic-son says he gives her Norco twice a day prn and requesting  one. He is not comfortable going home in this state. Had BM  x6 on 12/3 and BM x10 on 1/4. Informed that insurance has sent denial for hospital stay from 12/3 onward.    Assessment and Plan: Principal Problem:   Acute hepatic encephalopathy (HCC) Active Problems:   Cirrhosis due to NASH   Iron deficiency anemia   GERD (gastroesophageal reflux disease)   Thrombocytopenia (HCC)   Hypothyroidism   Morbid obesity (HCC)   Folate deficiency   AKI (acute kidney injury) (HCC)   Depression, major, recurrent, moderate (HCC)   Grade I diastolic dysfunction   Acute hepatic encephalopathy, recurrent NASH liver cirrhosis Deconditioning debility weakness 2/2 underlying medical conditions: Appears weak and deconditioned mental status fluctuating and son believes she is still "fuzzy".  She has chronic diarrhea due to short gut syndrome limiting lactulose use but having 6-8 Bms Continue lactulose, rifaximin. Cont supportive care PT OT. Monitoring ammonia level however son has been informed it will not correlate with mental status level at 80-s now  he states it has been as low as 30s and she will be better if < 60.  Check labs again given her frequent BM and on lasix. Liver cirrhosis compensated- Cont aldactone and Lasix and watching creatinine- if creatinine trends up will cut down Lasix to 20 mg. Continue PT OT fall and delirium precautions.  AKI: Creatinine elevated 1.8 on admission, improved 0.8 and lasix resumed. Repeat labs.  If having dehydration AKI in the setting of multiple BM short gut syndrome and Lasix we will need to revisit her diuretic dose. Recent Labs    10/29/22 0423  10/30/22 0430 11/30/22 1805 12/01/22 0551 12/02/22 0600 12/04/22 0831 12/10/22 1148 12/11/22 0455 12/12/22 0507 12/14/22 0528  BUN 22 21 19 20 19 15  28* 25* 21 17  CREATININE 1.17* 1.12* 1.18* 1.10* 1.04* 1.00 1.81* 1.34* 1.15* 0.89  CO2 19* 20* 22 21* 22 23 21* 20* 17* 19*  K 3.2* 3.3* 3.3* 3.6 3.9 4.0  4.0 3.4* 3.6 4.1    Vaginal bleeding: 11-30-2022  Patient developed vaginal bleeding during her hospitalization for ESBL UTI and bacteremia in September.  She has been on Megace 40mg  BID.  -She is still awaiting appointment to undergo endometrial biopsy and Pap smear and to continue Megace until further evaluation.No recent vaginal bleeding. Hb stable. Recent Labs  Lab 12/10/22 1148 12/11/22 0455 12/12/22 0507 12/14/22 0528  HGB 9.8* 8.0* 8.3* 9.2*  HCT 31.9* 26.4* 28.0* 30.6*     Hypokalemia Hypomagnesemia: Resolved cont K-Dur 20 twice daily, repeat labs, repelte mag at risk for gi loss. Recent Labs  Lab 12/10/22 1148 12/11/22 0455 12/12/22 0507 12/14/22 0528  K 4.0 3.4* 3.6 4.1  CALCIUM 8.8* 8.5* 8.6* 8.7*  MG 2.3  --   --  1.8    Short gut syndrome with chronic diarrhea: This limits her lactulose use, having Bms frequently.  Hypotension: BP currently controlled on Midodrine    Hypothyroidism: Stable on home Synthroid.     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  Currently no evidence of infection.  Afebrile and no leukocytosis  Goals of care: Patient is DNR confirmed with patient's son, they are aware about palliative care and was previously seen.  Overall prognosis is poor, at risk for recurrent hospitalization decompensation   Morbid obesity:Patient's Body mass index is 40.36 kg/m. : Will benefit with PCP follow-up, weight loss  healthy lifestyle and outpatient sleep evaluation.   DVT prophylaxis: SCDs Start: 12/10/22 1422 Code Status:   Code Status: Limited: Do not attempt resuscitation (DNR) -DNR-LIMITED -Do Not Intubate/DNI  Family Communication: plan of care discussed with patient/son updated  Patient status is:  admitted as observation but remains hospitalized for ongoing  because of AKI/generalized weakness/liver  cirrhosis> changed to inpatient  Level of care: Telemetry  Dispo: The patient is from: home,lives with son.            Anticipated disposition: Anticipate discharge home once mental status improves and patient and son comfortable.They do not feel comfortable going home again-she is high risk for readmissions decompensation. TOC monitoring.    Objective: Vitals last 24 hrs: Vitals:   12/14/22 2131 12/14/22 2248 12/15/22 0305 12/15/22 0929  BP: 133/63 (!) 128/57 126/68 124/61  Pulse: 73 79 79 81  Resp: (!) 21  20 20   Temp: 97.9 F (36.6 C)  99 F (37.2 C) 98.7 F (37.1 C)  TempSrc: Oral  Oral Oral  SpO2: 96%  100% 98%  Weight:      Height:       Weight change:   Physical Examination: General exam: alert awake, oriented self place people HEENT:Oral mucosa moist, Ear/Nose WNL grossly Respiratory system: Bilaterally clear BS,no use of accessory muscle Cardiovascular system: S1 & S2 +, No JVD. Gastrointestinal system: Abdomen soft,NT,ND, BS+ Nervous System: Alert, awake, moving all extremities,and following commands. Extremities: LE edema neg,distal peripheral pulses palpable and warm.  Skin: No rashes,no icterus. MSK: Normal muscle bulk,tone, power   Medications reviewed:  Scheduled Meds:  [  START ON 12/16/2022] cyanocobalamin  2,000 mcg Oral Q Fri   famotidine  20 mg Oral Daily   folic acid  1 mg Oral Daily   furosemide  40 mg Oral Daily   lactulose  20 g Oral TID   levothyroxine  150 mcg Oral QAC breakfast   megestrol  40 mg Oral BID   pantoprazole  40 mg Oral BID AC   potassium chloride SA  20 mEq Oral BID   rifaximin  550 mg Oral BID   rOPINIRole  0.25 mg Oral Daily   spironolactone  25 mg Oral Daily   tamsulosin  0.4 mg Oral Daily   venlafaxine XR  75 mg Oral Q breakfast  Continuous Infusions:   Diet Order             Diet Heart Fluid consistency: Thin  Diet effective now                  Intake/Output Summary (Last 24 hours) at 12/15/2022 1000 Last data  filed at 12/15/2022 0900 Gross per 24 hour  Intake 240 ml  Output 0 ml  Net 240 ml    Net IO Since Admission: 1,539 mL [12/15/22 1000]  Wt Readings from Last 3 Encounters:  12/10/22 100.1 kg  10/22/22 100.1 kg  10/10/22 100.7 kg     Unresulted Labs (From admission, onward)     Start     Ordered   12/15/22 0949  Basic metabolic panel  Once,   R       Question:  Specimen collection method  Answer:  Lab=Lab collect   12/15/22 0949   12/15/22 0938  Magnesium  Once,   R       Question:  Specimen collection method  Answer:  Lab=Lab collect   12/15/22 0937   12/11/22 0500  Ammonia  Daily,   R      12/10/22 1420          Data Reviewed: I have personally reviewed following labs and imaging studies CBC: Recent Labs  Lab 12/10/22 1148 12/11/22 0455 12/12/22 0507 12/14/22 0528  WBC 6.1 4.4 4.8 5.7  HGB 9.8* 8.0* 8.3* 9.2*  HCT 31.9* 26.4* 28.0* 30.6*  MCV 94.4 95.7 98.6 96.5  PLT 139* 115* 112* 126*   Basic Metabolic Panel:  Recent Labs  Lab 12/10/22 1148 12/11/22 0455 12/12/22 0507 12/14/22 0528  NA 138 140 141 140  K 4.0 3.4* 3.6 4.1  CL 109 114* 118* 117*  CO2 21* 20* 17* 19*  GLUCOSE 112* 111* 111* 107*  BUN 28* 25* 21 17  CREATININE 1.81* 1.34* 1.15* 0.89  CALCIUM 8.8* 8.5* 8.6* 8.7*  MG 2.3  --   --  1.8   GFR: Estimated Creatinine Clearance: 69.7 mL/min (by C-G formula based on SCr of 0.89 mg/dL). Liver Function Tests:  Recent Labs  Lab 12/10/22 1148 12/11/22 0455  AST 37 27  ALT 22 18  ALKPHOS 96 75  BILITOT 1.4* 1.6*  PROT 6.2* 5.4*  ALBUMIN 2.7* 2.9*   Recent Labs  Lab 12/10/22 1148  LIPASE 53*    Recent Labs  Lab 12/11/22 0455 12/12/22 0507 12/13/22 0515 12/14/22 0528 12/15/22 0536  AMMONIA 102* 109* 108* 108* 85*   Coagulation Profile:  Recent Labs  Lab 12/10/22 1148  INR 1.6*   Recent Labs  Lab 12/10/22 1049  GLUCAP 112*   No results found for this or any previous visit (from the past 240 hour(s)).   Antimicrobials: Anti-infectives (  From admission, onward)    Start     Dose/Rate Route Frequency Ordered Stop   12/10/22 2200  rifaximin (XIFAXAN) tablet 550 mg        550 mg Oral 2 times daily 12/10/22 1718        Culture/Microbiology    Component Value Date/Time   SDES  10/21/2022 1923    BLOOD BLOOD RIGHT HAND Performed at [facility], [address] [place], [place] [address]    SDES  10/21/2022 1923    BLOOD BLOOD RIGHT HAND Performed at [facility], [address], [place], [place] [address]    SPECREQUEST  10/21/2022 1923    BOTTLES DRAWN AEROBIC ONLY Blood Culture adequate volume Performed at [facility], [address], [place], [place] [address]    SPECREQUEST  10/21/2022 1923    BOTTLES DRAWN AEROBIC ONLY Blood Culture adequate volume Performed at [facility], [address], [place], [place] [address]    CULT  10/21/2022 1923    NO GROWTH 5 DAYS Performed at [facility], [address], [place], [place] [address]    CULT  10/21/2022 1923    NO GROWTH 5 DAYS Performed at [facility], [address], [place], [place] [address]    REPTSTATUS 10/26/2022 FINAL 10/21/2022 1923   REPTSTATUS 10/26/2022 FINAL 10/21/2022 1923  Radiology Studies: No results found.  LOS: 0 days  Total time spent in review of labs and imaging, patient evaluation, formulation of plan, documentation and communication with family: 35 minutes  Lanae Boast, MD [facility]  12/15/2022, 10:00 AM

## 2022-12-16 DIAGNOSIS — K7460 Unspecified cirrhosis of liver: Secondary | ICD-10-CM

## 2022-12-16 DIAGNOSIS — R4182 Altered mental status, unspecified: Secondary | ICD-10-CM | POA: Diagnosis not present

## 2022-12-16 DIAGNOSIS — E860 Dehydration: Secondary | ICD-10-CM

## 2022-12-16 DIAGNOSIS — K7682 Hepatic encephalopathy: Secondary | ICD-10-CM | POA: Diagnosis not present

## 2022-12-16 DIAGNOSIS — N179 Acute kidney failure, unspecified: Secondary | ICD-10-CM | POA: Diagnosis not present

## 2022-12-16 DIAGNOSIS — D509 Iron deficiency anemia, unspecified: Secondary | ICD-10-CM

## 2022-12-16 DIAGNOSIS — E7220 Disorder of urea cycle metabolism, unspecified: Secondary | ICD-10-CM

## 2022-12-16 LAB — AMMONIA: Ammonia: 134 umol/L — ABNORMAL HIGH (ref 9–35)

## 2022-12-16 MED ORDER — SPIRONOLACTONE 25 MG PO TABS
50.0000 mg | ORAL_TABLET | Freq: Every day | ORAL | Status: DC
  Administered 2022-12-16: 50 mg via ORAL
  Filled 2022-12-16: qty 2

## 2022-12-16 MED ORDER — FUROSEMIDE 40 MG PO TABS: 40.0000 mg | ORAL_TABLET | ORAL | Status: DC

## 2022-12-16 NOTE — Plan of Care (Signed)
  Problem: Nutrition: Goal: Adequate nutrition will be maintained Outcome: Progressing   Problem: Coping: Goal: Level of anxiety will decrease Outcome: Progressing   Problem: Pain Management: Goal: General experience of comfort will improve Outcome: Progressing   Problem: Safety: Goal: Ability to remain free from injury will improve Outcome: Progressing   Problem: Skin Integrity: Goal: Risk for impaired skin integrity will decrease Outcome: Progressing

## 2022-12-16 NOTE — Progress Notes (Signed)
Occupational Therapy Treatment Patient Details Name: [name] MRN: [id number] DOB: [birth year] Today's Date: 12/16/2022   History of present illness 67 year old female with PMH of NASH cirrhosis, chronic diarrhea, short gut syndrome, PE/DVT not on AC, morbid obesity, HTN, systolic murmur, hypothyroidism and chronic hypotension presenting with altered mental status and admitted with working diagnosis of  acute hepatic encephalopathy in setting of Cirrhosis   OT comments  Pt noted to be with generalize strength deficits during prior therapy session, therefore instruction was provided on therapeutic exercises for strengthening needed to facilitate progressive ADL performance. OT instructed pt on upper body exercises using a light resistance exercise band. She performed multiple reps and 1 set of bicep curls, tricep extensions, diagonal pulls and vertical exchanges while at bed level. She required occasional cues for correct technique/form. She also required intermittent therapeutic rest breaks, given compromised endurance. Thera band issued and left in pt's room; she may benefit from further instruction as pertains to exercises. Continue OT plan of care. Recommend return home with home therapy and family support.       If plan is discharge home, recommend the following:  Help with stairs or ramp for entrance;Assistance with cooking/housework;Assist for transportation;A little help with walking and/or transfers;A little help with bathing/dressing/bathroom   Equipment Recommendations  None recommended by OT    Recommendations for Other Services      Precautions / Restrictions Precautions Precautions: Fall Restrictions Weight Bearing Restrictions: No              ADL either performed or assessed with clinical judgement    Vision   Additional Comments: chronic L eye blindness          Cognition Arousal: Alert Behavior During Therapy: WFL for tasks assessed/performed Overall  Cognitive Status: Within Functional Limits for tasks assessed                        Pertinent Vitals/ Pain       Pain Assessment Pain Assessment: Faces Pain Score: 4  Pain Location: back Pain Descriptors / Indicators: Sore Pain Intervention(s): Limited activity within patient's tolerance, Monitored during session         Frequency  Min 1X/week        Progress Toward Goals  OT Goals(current goals can now be found in the care plan section)  Progress towards OT goals: Progressing toward goals  Acute Rehab OT Goals OT Goal Formulation: With patient Time For Goal Achievement: 12/25/22 Potential to Achieve Goals: Good  Plan         AM-PAC OT "6 Clicks" Daily Activity     Outcome Measure   Help from another person eating meals?: None Help from another person taking care of personal grooming?: None Help from another person toileting, which includes using toliet, bedpan, or urinal?: A Little Help from another person bathing (including washing, rinsing, drying)?: A Lot Help from another person to put on and taking off regular upper body clothing?: A Little Help from another person to put on and taking off regular lower body clothing?: A Lot 6 Click Score: 18    End of Session Equipment Utilized During Treatment: Other (comment) (N/A)  OT Visit Diagnosis: Unsteadiness on feet (R26.81);Muscle weakness (generalized) (M62.81);Other abnormalities of gait and mobility (R26.89)   Activity Tolerance Patient tolerated treatment well   Patient Left in bed;with call bell/phone within reach;with bed alarm set   Nurse Communication Other (comment) (pt seen for therapeutic exercises)  Time: 0981-1914 OT Time Calculation (min): 16 min  Charges: OT General Charges $OT Visit: 1 Visit OT Treatments $Self Care/Home Management : 8-22 mins $Therapeutic Activity: 8-22 mins     Reuben Likes, OTR/L 12/16/2022, 5:21 PM

## 2022-12-16 NOTE — Progress Notes (Signed)
Mobility Specialist - Progress Note   12/16/22 1330  Mobility  Activity Ambulated with assistance in hallway  Level of Assistance Minimal assist, patient does 75% or more  Assistive Device Front wheel walker  Distance Ambulated (ft) 50 ft  Range of Motion/Exercises Active  Activity Response Tolerated well  Mobility Referral Yes  Mobility visit 1 Mobility  Mobility Specialist Start Time (ACUTE ONLY) 1315  Mobility Specialist Stop Time (ACUTE ONLY) 1330  Mobility Specialist Time Calculation (min) (ACUTE ONLY) 15 min   Pt was found in bed and agreeable to ambulate. No complaints with session. At EOS returned to recliner chair with all needs met. Chair alarm on and son in room.  Billey Chang Mobility Specialist

## 2022-12-16 NOTE — Progress Notes (Signed)
Occupational Therapy Treatment Patient Details Name: [name] MRN: [id number] DOB: [birth year] Today's Date: 12/16/2022   History of present illness 67 year old female with PMH of NASH cirrhosis, chronic diarrhea, short gut syndrome, PE/DVT not on AC, morbid obesity, HTN, systolic murmur, hypothyroidism and chronic hypotension presenting with altered mental status and admitted with working diagnosis of  acute hepatic encephalopathy in setting of Cirrhosis   OT comments  Pt requiring increased time having been awakened. CGA to stand and ambulate to bathroom with RW for toileting and one grooming activity standing at sink. Pt requesting to return to supine. Continue to recommend HHOT.       If plan is discharge home, recommend the following:  Help with stairs or ramp for entrance;Assistance with cooking/housework;Assist for transportation;A little help with walking and/or transfers;A little help with bathing/dressing/bathroom   Equipment Recommendations  None recommended by OT    Recommendations for Other Services      Precautions / Restrictions Precautions Precautions: Fall Restrictions Weight Bearing Restrictions: No       Mobility Bed Mobility Overal bed mobility: Needs Assistance Bed Mobility: Supine to Sit, Sit to Supine     Supine to sit: Supervision Sit to supine: Supervision   General bed mobility comments: increased time, use of rail, HOB nearly flat    Transfers Overall transfer level: Needs assistance Equipment used: Rolling walker (2 wheels) Transfers: Sit to/from Stand Sit to Stand: Contact guard assist           General transfer comment: use of momentum from bed and toilet     Balance Overall balance assessment: Needs assistance   Sitting balance-Leahy Scale: Good     Standing balance support: Bilateral upper extremity supported Standing balance-Leahy Scale: Poor Standing balance comment: reliant on RW for dynamic balance, fair static at  sink                           ADL either performed or assessed with clinical judgement   ADL Overall ADL's : Needs assistance/impaired     Grooming: Wash/dry hands;Standing;Contact guard assist                   Toilet Transfer: Ambulation;Rolling walker (2 wheels);Contact guard assist   Toileting- Clothing Manipulation and Hygiene: Sit to/from stand;Minimal assistance Toileting - Clothing Manipulation Details (indicate cue type and reason): reminded pt to hold grab bar for stability with posterior pericare     Functional mobility during ADLs: Rolling walker (2 wheels);Contact guard assist      Extremity/Trunk Assessment              Vision       Perception     Praxis      Cognition Arousal: Alert Behavior During Therapy: WFL for tasks assessed/performed Overall Cognitive Status: Within Functional Limits for tasks assessed                                 General Comments: awakened for OT        Exercises      Shoulder Instructions       General Comments      Pertinent Vitals/ Pain       Pain Assessment Pain Assessment: Faces Faces Pain Scale: Hurts little more Pain Location: back Pain Descriptors / Indicators: Sore  Home Living  Prior Functioning/Environment              Frequency  Min 1X/week        Progress Toward Goals  OT Goals(current goals can now be found in the care plan section)  Progress towards OT goals: Progressing toward goals  Acute Rehab OT Goals OT Goal Formulation: With patient Time For Goal Achievement: 12/25/22 Potential to Achieve Goals: Good  Plan      Co-evaluation                 AM-PAC OT "6 Clicks" Daily Activity     Outcome Measure   Help from another person eating meals?: None Help from another person taking care of personal grooming?: A Little Help from another person toileting, which includes  using toliet, bedpan, or urinal?: A Little Help from another person bathing (including washing, rinsing, drying)?: A Lot Help from another person to put on and taking off regular upper body clothing?: A Little Help from another person to put on and taking off regular lower body clothing?: A Lot 6 Click Score: 17    End of Session Equipment Utilized During Treatment: Rolling walker (2 wheels);Gait belt  OT Visit Diagnosis: Unsteadiness on feet (R26.81);Muscle weakness (generalized) (M62.81);Other abnormalities of gait and mobility (R26.89)   Activity Tolerance Patient limited by fatigue   Patient Left in bed;with call bell/phone within reach   Nurse Communication          Time: 1324-4010 OT Time Calculation (min): 15 min  Charges: OT General Charges $OT Visit: 1 Visit OT Treatments $Self Care/Home Management : 8-22 mins  Berna Spare, OTR/L Acute Rehabilitation Services Office: [phone]   Evern Bio 12/16/2022, 3:23 PM

## 2022-12-16 NOTE — Progress Notes (Signed)
[facility] PROGRESS NOTE    Progress Note  [name]  HKV:[id number] DOB: [birth year] DOA: 12/10/2022 PCP: [name], MD     Brief Narrative:   [name] is an 67 y.o. female past medical history of [name] with cirrhosis on lactulose rifaximin and midodrine, chronic diarrhea, obesity, PE DVT , short gut syndrome, cholelithiasis, recurrent admissions for hepatic encephalopathy recently discharged from the hospital 12/05/2022 due to acute hepatic encephalopathy returns to the emergency room for confusion that started on 12/09/2022, the son brought her back because confusion and less bowel movements.  He relates she is on daily rifaximin and lactulose as needed as instructed by GI due to her chronic diarrhea.  Assessment/Plan:   Acute hepatic encephalopathy (HCC) NASH with liver cirrhosis She has chronic diarrhea due to short gut syndrome and limited use of lactulose as she has 6-8 bowel movements a day. She was started on daily lactulose and rifaximin. Her ammonia level was up this morning. We need a goal of around 6 bowel movements a day and if she gets dehydrated we will have to reduce her dose of Lasix. Will continue ammonia level daily. Continue IV Lasix and Aldactone. Her creatinine is improving.  Acute kidney injury: The baseline creatinine of less than 1.  On admission 1.3. Likely prerenal azotemia due to decreased oral intake, short gut syndrome and Lasix. She was started on IV fluids and her creatinine has returned to baseline.  Vaginal bleeding: He is on Megace twice a day. She is awaiting an appointment with GYN to undergo endometrial biopsy no vaginal bleeding.  Electrolyte imbalance/hypokalemia/hypomagnesemia: [place] potassium greater than 4 magnesium greater than 2.  Short gut syndrome: This limits the use of lactulose but will have to back up on the Lasix, and ordered to keep her taking her lactulose to prevent attic  encephalopathy.  Hypotension: Blood pressure is currently stable continue midodrine.  Hypothyroidism:  Continue Synthroid.  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  History of multidrug-resistant Pseudomonas aeruginosa: No evidence of infection and she is afebrile with no leukocytosis.  Goals of care: Patient is DNR. Overall poor prognosis high risk of hospitalization and decompensation.  Morbid obesity: Noted.   DVT prophylaxis: Lovenox Family Communication:none Status is: Inpatient Remains inpatient appropriate because: hepatic encephalopathy    Code Status:     Code Status Orders  (From admission, onward)           Start     Ordered   12/10/22 1422  Do not attempt resuscitation (DNR)- Limited -Do Not Intubate (DNI)  Continuous       Question Answer Comment  If pulseless and not breathing No CPR or chest compressions.   In Pre-Arrest Conditions (Patient Is Breathing and Has A Pulse) Do not intubate. Provide all appropriate non-invasive medical interventions. Avoid ICU transfer unless indicated or required.   Consent: Discussion documented in EHR or advanced directives reviewed      12/10/22 1423           Code Status History     Date Active Date Inactive Code Status Order ID Comments User Context   11/30/2022 2214 12/05/2022 1739 Limited: Do not attempt resuscitation (DNR) -DNR-LIMITED -Do Not Intubate/DNI  [id number]  Benita Gutter T, DO ED   10/30/2022 1051 10/31/2022 1630 Do not attempt resuscitation (DNR) PRE-ARREST INTERVENTIONS DESIRED [id number]  Rolly Salter, MD Inpatient   10/27/2022 1308 10/29/2022 1228 Full Code [id number]  Bennie Dallas, MD Inpatient   10/17/2022 1801 10/27/2022 1308 Do  not attempt resuscitation (DNR) PRE-ARREST INTERVENTIONS DESIRED [id number]  Alwyn Ren, MD ED   09/30/2022 1247 10/12/2022 1639 Do not attempt resuscitation (DNR) PRE-ARREST INTERVENTIONS DESIRED [id number]  Bobette Mo, MD ED   08/15/2022 2212  09/09/2022 1909 DNR [id number]  Ranee Gosselin, MD Inpatient   08/11/2022 1109 08/15/2022 2211 DNR [id number]  Norton Blizzard, NP ED   06/12/2022 2222 06/15/2022 1908 Full Code [id number]  Orland Mustard, MD Inpatient   05/16/2022 2308 05/22/2022 1922 Full Code [id number]  Rometta Emery, MD ED   05/01/2022 0022 05/10/2022 2002 Full Code [id number]  Anselm Jungling, DO ED      Advance Directive Documentation    Flowsheet Row Most Recent Value  Type of Advance Directive Healthcare Power of Attorney  Pre-existing out of facility DNR order (yellow form or pink MOST form) --  "MOST" Form in Place? --         IV Access:   Peripheral IV   Procedures and diagnostic studies:   No results found.   Medical Consultants:   None.   Subjective:    [name] no complains, making jokes this am  Objective:    Vitals:   12/15/22 1252 12/15/22 1524 12/15/22 2108 12/16/22 0526  BP: 122/65 131/63 (!) 125/56 (!) 152/84  Pulse: 78 75 75 81  Resp: 20 20 20 20   Temp: 98.4 F (36.9 C) (!) 97.5 F (36.4 C) 97.9 F (36.6 C) 97.9 F (36.6 C)  TempSrc: Oral Oral Oral Oral  SpO2: 99% 100% 99% 97%  Weight:      Height:       SpO2: 97 %   Intake/Output Summary (Last 24 hours) at 12/16/2022 0800 Last data filed at 12/16/2022 0109 Gross per 24 hour  Intake 933.29 ml  Output 6 ml  Net 927.29 ml   Filed Weights   12/10/22 1044  Weight: 100.1 kg    Exam: General exam: In no acute distress. Respiratory system: Good air movement and clear to auscultation. Cardiovascular system: S1 & S2 heard, RRR. No JVD. Gastrointestinal system: Abdomen is nondistended, soft and nontender.  Extremities: No pedal edema. Skin: No rashes, lesions or ulcers Psychiatry: Judgement and insight appear normal. Mood & affect appropriate.    Data Reviewed:    Labs: Basic Metabolic Panel: Recent Labs  Lab 12/10/22 1148 12/11/22 0455 12/12/22 0507 12/14/22 0528 12/15/22 0938  NA 138 140 141 140 143  K  4.0 3.4* 3.6 4.1 4.4  CL 109 114* 118* 117* 119*  CO2 21* 20* 17* 19* 19*  GLUCOSE 112* 111* 111* 107* 107*  BUN 28* 25* 21 17 16   CREATININE 1.81* 1.34* 1.15* 0.89 0.83  CALCIUM 8.8* 8.5* 8.6* 8.7* 8.6*  MG 2.3  --   --  1.8 1.9   GFR Estimated Creatinine Clearance: 74.8 mL/min (by C-G formula based on SCr of 0.83 mg/dL). Liver Function Tests: Recent Labs  Lab 12/10/22 1148 12/11/22 0455  AST 37 27  ALT 22 18  ALKPHOS 96 75  BILITOT 1.4* 1.6*  PROT 6.2* 5.4*  ALBUMIN 2.7* 2.9*   Recent Labs  Lab 12/10/22 1148  LIPASE 53*   Recent Labs  Lab 12/12/22 0507 12/13/22 0515 12/14/22 0528 12/15/22 0536 12/16/22 0522  AMMONIA 109* 108* 108* 85* 134*   Coagulation profile Recent Labs  Lab 12/10/22 1148  INR 1.6*   COVID-19 Labs  No results for input(s): "DDIMER", "FERRITIN", "LDH", "CRP" in the last 72 hours.  Lab Results  Component  Value Date   SARSCOV2NAA NEGATIVE 09/30/2022   SARSCOV2NAA NEGATIVE 09/07/2022   SARSCOV2NAA NEGATIVE 08/11/2022   SARSCOV2NAA NEGATIVE 07/29/2022    CBC: Recent Labs  Lab 12/10/22 1148 12/11/22 0455 12/12/22 0507 12/14/22 0528  WBC 6.1 4.4 4.8 5.7  HGB 9.8* 8.0* 8.3* 9.2*  HCT 31.9* 26.4* 28.0* 30.6*  MCV 94.4 95.7 98.6 96.5  PLT 139* 115* 112* 126*   Cardiac Enzymes: No results for input(s): "CKTOTAL", "CKMB", "CKMBINDEX", "TROPONINI" in the last 168 hours. BNP (last 3 results) No results for input(s): "PROBNP" in the last 8760 hours. CBG: Recent Labs  Lab 12/10/22 1049  GLUCAP 112*   D-Dimer: No results for input(s): "DDIMER" in the last 72 hours. Hgb A1c: No results for input(s): "HGBA1C" in the last 72 hours. Lipid Profile: No results for input(s): "CHOL", "HDL", "LDLCALC", "TRIG", "CHOLHDL", "LDLDIRECT" in the last 72 hours. Thyroid function studies: No results for input(s): "TSH", "T4TOTAL", "T3FREE", "THYROIDAB" in the last 72 hours.  Invalid input(s): "FREET3" Anemia work up: No results for input(s):  "VITAMINB12", "FOLATE", "FERRITIN", "TIBC", "IRON", "RETICCTPCT" in the last 72 hours. Sepsis Labs: Recent Labs  Lab 12/10/22 1148 12/11/22 0455 12/12/22 0507 12/14/22 0528  WBC 6.1 4.4 4.8 5.7   Microbiology No results found for this or any previous visit (from the past 240 hour(s)).   Medications:    cyanocobalamin  2,000 mcg Oral Q Fri   famotidine  20 mg Oral Daily   folic acid  1 mg Oral Daily   furosemide  40 mg Oral Daily   lactulose  20 g Oral TID   levothyroxine  150 mcg Oral QAC breakfast   megestrol  40 mg Oral BID   methocarbamol  500 mg Oral q AM   pantoprazole  40 mg Oral BID AC   potassium chloride SA  20 mEq Oral BID   rifaximin  550 mg Oral BID   rOPINIRole  0.25 mg Oral Daily   spironolactone  25 mg Oral Daily   tamsulosin  0.4 mg Oral Daily   venlafaxine XR  75 mg Oral Q breakfast   Continuous Infusions:    LOS: 1 day   Marinda Elk  [facility]  12/16/2022, 8:00 AM

## 2022-12-17 DIAGNOSIS — K7682 Hepatic encephalopathy: Secondary | ICD-10-CM | POA: Diagnosis not present

## 2022-12-17 DIAGNOSIS — E860 Dehydration: Secondary | ICD-10-CM | POA: Diagnosis not present

## 2022-12-17 DIAGNOSIS — N179 Acute kidney failure, unspecified: Secondary | ICD-10-CM | POA: Diagnosis not present

## 2022-12-17 DIAGNOSIS — R4182 Altered mental status, unspecified: Secondary | ICD-10-CM | POA: Diagnosis not present

## 2022-12-17 LAB — BASIC METABOLIC PANEL
Anion gap: 5 (ref 5–15)
BUN: 17 mg/dL (ref 8–23)
CO2: 18 mmol/L — ABNORMAL LOW (ref 22–32)
Calcium: 8.4 mg/dL — ABNORMAL LOW (ref 8.9–10.3)
Chloride: 115 mmol/L — ABNORMAL HIGH (ref 98–111)
Creatinine, Ser: 0.87 mg/dL (ref 0.44–1.00)
GFR, Estimated: >60 mL/min (ref >60)
Glucose, Bld: 96 mg/dL (ref 70–99)
Potassium: 4.2 mmol/L (ref 3.5–5.1)
Sodium: 138 mmol/L (ref 135–145)

## 2022-12-17 LAB — AMMONIA: Ammonia: 64 umol/L — ABNORMAL HIGH (ref 9–35)

## 2022-12-17 MED ORDER — SPIRONOLACTONE 50 MG PO TABS: 50.0000 mg | ORAL_TABLET | Freq: Every day | ORAL | 0 refills | Status: DC

## 2022-12-17 MED ORDER — FUROSEMIDE 40 MG PO TABS: 40.0000 mg | ORAL_TABLET | Freq: Every day | ORAL | Status: DC

## 2022-12-17 NOTE — Progress Notes (Signed)
AVS reviewed w/ pt & son in room. Son verbalized an understanding. All questions answered. Pt is getting in the shower. PIV will need to be removed prior to d/c to home. Primary nurse updated

## 2022-12-17 NOTE — Plan of Care (Signed)
  Problem: Safety: Goal: Ability to remain free from injury will improve Outcome: Progressing   Problem: Skin Integrity: Goal: Risk for impaired skin integrity will decrease Outcome: Progressing   Problem: Pain Management: Goal: General experience of comfort will improve Outcome: Progressing   Problem: Elimination: Goal: Will not experience complications related to bowel motility Outcome: Progressing   Problem: Nutrition: Goal: Adequate nutrition will be maintained Outcome: Progressing   Problem: Clinical Measurements: Goal: Ability to maintain clinical measurements within normal limits will improve Outcome: Progressing   Problem: Education: Goal: Knowledge of General Education information will improve Description: Including pain rating scale, medication(s)/side effects and non-pharmacologic comfort measures Outcome: Progressing

## 2022-12-17 NOTE — TOC Transition Note (Signed)
Transition of Care [facility]) - CM/SW Discharge Note   Patient Details  Name: [name] MRN: [id number] Date of Birth: 10-23-1955  Transition of Care [facility]) CM/SW Contact:  Adrian Prows, RN Phone Number: 12/17/2022, 10:55 AM   Clinical Narrative:    D/C orders received; HHPT previously set up w/ [facility]; Heather at agency notified; no TOC needs.   Final next level of care: Home w Home Health Services Barriers to Discharge: No Barriers Identified   Patient Goals and CMS Choice CMS Medicare.gov Compare Post Acute Care list provided to:: Patient Represenative (must comment) [name] (son))    Discharge Placement                         Discharge Plan and Services Additional resources added to the After Visit Summary for     Discharge Planning Services: CM Consult                                 Social Determinants of Health (SDOH) Interventions SDOH Screenings   Food Insecurity: No Food Insecurity (12/11/2022)  Housing: Low Risk  (12/11/2022)  Transportation Needs: No Transportation Needs (12/11/2022)  Utilities: Not At Risk (12/11/2022)  Depression (PHQ2-9): Medium Risk (11/03/2022)  Tobacco Use: Medium Risk (12/10/2022)     Readmission Risk Interventions    10/19/2022   10:12 AM 08/15/2022    3:20 PM 05/18/2022   10:24 AM  Readmission Risk Prevention Plan  Transportation Screening Complete Complete Complete  PCP or Specialist Appt within 5-7 Days   Complete  Home Care Screening   Complete  Medication Review (RN CM)   Complete  Medication Review (RN Care Manager) Complete Complete   PCP or Specialist appointment within 3-5 days of discharge Complete Complete   HRI or Home Care Consult Complete Complete   SW Recovery Care/Counseling Consult Complete Complete   Palliative Care Screening Not Applicable Not Applicable   Skilled Nursing Facility Not Applicable Not Applicable

## 2022-12-17 NOTE — Discharge Summary (Signed)
Physician Discharge Summary  [name] XLK:[id number] DOB: May 27, 1955 DOA: 12/10/2022  PCP: [name], MD  Admit date: 12/10/2022 Discharge date: 12/17/2022  Admitted From: Home Disposition:  Home  Recommendations for Outpatient Follow-up:  Follow up with PCP in 1-2 weeks Please obtain BMP/CBC in one week   Home Health:No Equipment/Devices:None  Discharge Condition:Stable CODE STATUS: DNR Diet recommendation: Heart Healthy   Brief/Interim Summary:  67 y.o. female past medical history of [name] with cirrhosis on lactulose rifaximin and midodrine, chronic diarrhea, obesity, PE DVT , short gut syndrome, cholelithiasis, recurrent admissions for hepatic encephalopathy recently discharged from the hospital 12/05/2022 due to acute hepatic encephalopathy returns to the emergency room for confusion that started on 12/09/2022, the son brought her back because confusion and less bowel movements.  He relates she is on daily rifaximin and lactulose as needed as instructed by GI due to her chronic diarrhea.    Discharge Diagnoses:  Principal Problem:   Acute hepatic encephalopathy (HCC) Active Problems:   Cirrhosis due to NASH   Iron deficiency anemia   GERD (gastroesophageal reflux disease)   Thrombocytopenia (HCC)   Hypothyroidism   Morbid obesity (HCC)   Folate deficiency   AKI (acute kidney injury) (HCC)   Depression, major, recurrent, moderate (HCC)   Hepatic encephalopathy (HCC)   Grade I diastolic dysfunction  Acute metabolic encephalopathy in the setting of NASH cirrhosis of the liver: She was started on lactulose and rifaximin and IV fluids. Her mentation improved. She will continue lactulose 3 times a day for 4-6 bowel movements. Her Lasix was changed to as needed. Her Aldactone was increased. In potassium remains stable.  Acute kidney injury: With a baseline creatinine of less than 1 admission 1.3. Likely prerenal azotemia in the setting of decreased oral intake  multiple bowel movements and Lasix use.   Vaginal bleeding: Will follow-up with GYN as an outpatient.  Electrolyte imbalance/hypokalemia/hypomagnesemia: There were repleted now improved.  Hypertension: Continue midodrine.  Hypothyroidism: Continue Synthroid.  Glaucoma of the left eye: Unchanged.  History of multidrug-resistant Pseudomonas: No evidence of infection.  Goals of care: She is DNR/DNI  Discharge Instructions  Discharge Instructions     Diet - low sodium heart healthy   Complete by: As directed    Increase activity slowly   Complete by: As directed    No wound care   Complete by: As directed       Allergies as of 12/17/2022       Reactions   Ace Inhibitors Anaphylaxis, Swelling   Ivp Dye [iodinated Contrast Media] Anaphylaxis   Desitin [zinc Oxide] Rash, Other (See Comments)   Worsens rash   Gadolinium Derivatives Other (See Comments)   Reaction??   Hydroxyzine Other (See Comments)   Affects the mind   Chlorhexidine Rash, Other (See Comments)   WORSENS RASHES   Doxycycline Rash   Penicillins Rash   Zofran [ondansetron] Rash        Medication List     STOP taking these medications    megestrol 40 MG tablet Commonly known as: MEGACE   potassium chloride 10 MEQ tablet Commonly known as: KLOR-CON       TAKE these medications    atorvastatin 40 MG tablet Commonly known as: LIPITOR Take 40 mg by mouth in the morning.   colchicine 0.6 MG tablet Take 0.6 mg by mouth daily as needed (gout flares).   cyanocobalamin 1000 MCG tablet Commonly known as: VITAMIN B12 Take 2,000 mcg by mouth every Friday.   dicyclomine  10 MG capsule Commonly known as: BENTYL Take 1 capsule (10 mg total) by mouth 3 (three) times daily as needed for spasms (abdominal cramping).   EPINEPHrine 0.3 mg/0.3 mL Soaj injection Commonly known as: EPI-PEN Inject 0.3 mg into the muscle as needed for anaphylaxis.   famotidine 20 MG tablet Commonly known as:  PEPCID Take 20 mg by mouth in the morning.   folic acid 1 MG tablet Commonly known as: FOLVITE Take 1 tablet (1 mg total) by mouth daily.   furosemide 40 MG tablet Commonly known as: LASIX Take 1 tablet (40 mg total) by mouth daily. Take additional 40 mg if there is a weight gain of 3 lbs in1 day or 5 lbs in 1 week. What changed:  when to take this additional instructions   IRON-VITAMIN C PO Take 1 tablet by mouth daily with breakfast.   lactulose 10 GM/15ML solution Commonly known as: CHRONULAC Take 30 mLs (20 g total) by mouth 3 (three) times daily. Take to ensure that there is 2 loose bowel movement EVERY DAY What changed:  when to take this reasons to take this additional instructions   levothyroxine 150 MCG tablet Commonly known as: SYNTHROID Take 150 mcg by mouth daily before breakfast.   lidocaine 5 % Commonly known as: LIDODERM Place 1 patch onto the skin daily. Remove & Discard patch within 12 hours or as directed by MD What changed:  when to take this reasons to take this additional instructions   methocarbamol 500 MG tablet Commonly known as: ROBAXIN Take 500 mg by mouth in the morning.   midodrine 10 MG tablet Commonly known as: PROAMATINE Take 1 tablet (10 mg total) by mouth 3 (three) times daily with meals.   naloxone 4 MG/0.1ML Liqd nasal spray kit Commonly known as: NARCAN Place 1 spray into the nose as needed (accidental overdose).   nystatin powder Apply 1 Application topically 3 (three) times daily as needed (for irritation- affected areas). What changed: Another medication with the same name was changed. Make sure you understand how and when to take each.   nystatin cream Commonly known as: MYCOSTATIN Apply topically 2 (two) times daily. What changed:  how much to take when to take this reasons to take this   pantoprazole 40 MG tablet Commonly known as: PROTONIX Take 1 tablet (40 mg total) by mouth 2 (two) times daily before a meal.    promethazine 12.5 MG tablet Commonly known as: PHENERGAN Take 12.5 mg by mouth every 6 (six) hours as needed for nausea or vomiting.   rifaximin 550 MG Tabs tablet Commonly known as: XIFAXAN Take 550 mg by mouth 2 (two) times daily.   rOPINIRole 0.25 MG tablet Commonly known as: REQUIP Take 1 tablet (0.25 mg total) by mouth at bedtime. What changed: when to take this   spironolactone 50 MG tablet Commonly known as: ALDACTONE Take 1 tablet (50 mg total) by mouth daily. What changed:  medication strength how much to take   tamsulosin 0.4 MG Caps capsule Commonly known as: FLOMAX Take 1 capsule (0.4 mg total) by mouth daily.   triamcinolone cream 0.5 % Commonly known as: KENALOG Apply to affected area of right breast twice daily as directed. What changed:  how much to take when to take this reasons to take this   TYLENOL 500 MG tablet Generic drug: acetaminophen Take 1,000 mg by mouth every 6 (six) hours as needed for mild pain (pain score 1-3) or headache.   venlafaxine XR 75  MG 24 hr capsule Commonly known as: EFFEXOR-XR Take 75 mg by mouth daily with breakfast.   Vitamin D (Ergocalciferol) 1.25 MG (50000 UNIT) Caps capsule Commonly known as: DRISDOL Take 50,000 Units by mouth every Wednesday.        Follow-up Information     [place], [facility] Follow up.   Why: HH physical therapy/occupational therapy Contact information: [address] [place] [address] [phone]                Allergies  Allergen Reactions   Ace Inhibitors Anaphylaxis and Swelling   Ivp Dye [Iodinated Contrast Media] Anaphylaxis   Desitin [Zinc Oxide] Rash and Other (See Comments)    Worsens rash   Gadolinium Derivatives Other (See Comments)    Reaction??   Hydroxyzine Other (See Comments)    Affects the mind   Chlorhexidine Rash and Other (See Comments)    WORSENS RASHES   Doxycycline Rash   Penicillins Rash   Zofran [Ondansetron]  Rash    Consultations: None   Procedures/Studies: No results found.   Subjective: No complaints  Discharge Exam: Vitals:   12/16/22 2047 12/17/22 0413  BP: 107/67 (!) 144/81  Pulse: 79 81  Resp: 18 20  Temp: 97.8 F (36.6 C) 98.1 F (36.7 C)  SpO2: 95% 97%   Vitals:   12/16/22 0526 12/16/22 1145 12/16/22 2047 12/17/22 0413  BP: (!) 152/84 129/63 107/67 (!) 144/81  Pulse: 81 85 79 81  Resp: 20 18 18 20   Temp: 97.9 F (36.6 C) 97.9 F (36.6 C) 97.8 F (36.6 C) 98.1 F (36.7 C)  TempSrc: Oral Oral Oral Oral  SpO2: 97% 98% 95% 97%  Weight:      Height:        General: Pt is alert, awake, not in acute distress Cardiovascular: RRR, S1/S2 +, no rubs, no gallops Respiratory: CTA bilaterally, no wheezing, no rhonchi Abdominal: Soft, NT, ND, bowel sounds + Extremities: no edema, no cyanosis    The results of significant diagnostics from this hospitalization (including imaging, microbiology, ancillary and laboratory) are listed below for reference.     Microbiology: No results found for this or any previous visit (from the past 240 hour(s)).   Labs: BNP (last 3 results) Recent Labs    08/30/22 0354  BNP 614.9*   Basic Metabolic Panel: Recent Labs  Lab 12/10/22 1148 12/11/22 0455 12/12/22 0507 12/14/22 0528 12/15/22 0938  NA 138 140 141 140 143  K 4.0 3.4* 3.6 4.1 4.4  CL 109 114* 118* 117* 119*  CO2 21* 20* 17* 19* 19*  GLUCOSE 112* 111* 111* 107* 107*  BUN 28* 25* 21 17 16   CREATININE 1.81* 1.34* 1.15* 0.89 0.83  CALCIUM 8.8* 8.5* 8.6* 8.7* 8.6*  MG 2.3  --   --  1.8 1.9   Liver Function Tests: Recent Labs  Lab 12/10/22 1148 12/11/22 0455  AST 37 27  ALT 22 18  ALKPHOS 96 75  BILITOT 1.4* 1.6*  PROT 6.2* 5.4*  ALBUMIN 2.7* 2.9*   Recent Labs  Lab 12/10/22 1148  LIPASE 53*   Recent Labs  Lab 12/13/22 0515 12/14/22 0528 12/15/22 0536 12/16/22 0522 12/17/22 0502  AMMONIA 108* 108* 85* 134* 64*   CBC: Recent Labs  Lab  12/10/22 1148 12/11/22 0455 12/12/22 0507 12/14/22 0528  WBC 6.1 4.4 4.8 5.7  HGB 9.8* 8.0* 8.3* 9.2*  HCT 31.9* 26.4* 28.0* 30.6*  MCV 94.4 95.7 98.6 96.5  PLT 139*  115* 112* 126*   Cardiac Enzymes: No results for input(s): "CKTOTAL", "CKMB", "CKMBINDEX", "TROPONINI" in the last 168 hours. BNP: Invalid input(s): "POCBNP" CBG: Recent Labs  Lab 12/10/22 1049  GLUCAP 112*   D-Dimer No results for input(s): "DDIMER" in the last 72 hours. Hgb A1c No results for input(s): "HGBA1C" in the last 72 hours. Lipid Profile No results for input(s): "CHOL", "HDL", "LDLCALC", "TRIG", "CHOLHDL", "LDLDIRECT" in the last 72 hours. Thyroid function studies No results for input(s): "TSH", "T4TOTAL", "T3FREE", "THYROIDAB" in the last 72 hours.  Invalid input(s): "FREET3" Anemia work up No results for input(s): "VITAMINB12", "FOLATE", "FERRITIN", "TIBC", "IRON", "RETICCTPCT" in the last 72 hours. Urinalysis    Component Value Date/Time   COLORURINE YELLOW 12/10/2022 1045   APPEARANCEUR CLOUDY (A) 12/10/2022 1045   LABSPEC 1.020 12/10/2022 1045   PHURINE 5.0 12/10/2022 1045   GLUCOSEU NEGATIVE 12/10/2022 1045   HGBUR MODERATE (A) 12/10/2022 1045   BILIRUBINUR NEGATIVE 12/10/2022 1045   KETONESUR NEGATIVE 12/10/2022 1045   PROTEINUR NEGATIVE 12/10/2022 1045   UROBILINOGEN 0.2 08/21/2013 0017   NITRITE NEGATIVE 12/10/2022 1045   LEUKOCYTESUR NEGATIVE 12/10/2022 1045   Sepsis Labs Recent Labs  Lab 12/10/22 1148 12/11/22 0455 12/12/22 0507 12/14/22 0528  WBC 6.1 4.4 4.8 5.7   Microbiology No results found for this or any previous visit (from the past 240 hour(s)).   Time coordinating discharge: Over 30 minutes  SIGNED:   Marinda Elk, MD  [facility] 12/17/2022, 8:00 AM Pager   If 7PM-7AM, please contact night-coverage [email or web address] Password [email or web address]

## 2023-01-02 ENCOUNTER — Other Ambulatory Visit: Payer: Self-pay

## 2023-01-02 ENCOUNTER — Encounter (HOSPITAL_COMMUNITY): Payer: Self-pay

## 2023-01-02 ENCOUNTER — Emergency Department (HOSPITAL_COMMUNITY): Payer: Medicare (Managed Care)

## 2023-01-02 ENCOUNTER — Inpatient Hospital Stay (HOSPITAL_COMMUNITY)
Admission: EM | Admit: 2023-01-02 | Discharge: 2023-01-13 | DRG: 442 | Disposition: A | Discharge status: Home Health | Payer: Medicare (Managed Care) | Attending: Family Medicine | Admitting: Family Medicine

## 2023-01-02 DIAGNOSIS — K529 Noninfective gastroenteritis and colitis, unspecified: Secondary | ICD-10-CM | POA: Diagnosis present

## 2023-01-02 DIAGNOSIS — K439 Ventral hernia without obstruction or gangrene: Secondary | ICD-10-CM | POA: Diagnosis present

## 2023-01-02 DIAGNOSIS — Z91041 Radiographic dye allergy status: Secondary | ICD-10-CM

## 2023-01-02 DIAGNOSIS — Z86718 Personal history of other venous thrombosis and embolism: Secondary | ICD-10-CM

## 2023-01-02 DIAGNOSIS — I060 Rheumatic aortic stenosis: Secondary | ICD-10-CM | POA: Diagnosis present

## 2023-01-02 DIAGNOSIS — R7881 Bacteremia: Secondary | ICD-10-CM | POA: Diagnosis present

## 2023-01-02 DIAGNOSIS — Z86711 Personal history of pulmonary embolism: Secondary | ICD-10-CM

## 2023-01-02 DIAGNOSIS — L03115 Cellulitis of right lower limb: Secondary | ICD-10-CM

## 2023-01-02 DIAGNOSIS — Z9049 Acquired absence of other specified parts of digestive tract: Secondary | ICD-10-CM

## 2023-01-02 DIAGNOSIS — B372 Candidiasis of skin and nail: Secondary | ICD-10-CM | POA: Diagnosis present

## 2023-01-02 DIAGNOSIS — I672 Cerebral atherosclerosis: Secondary | ICD-10-CM | POA: Diagnosis present

## 2023-01-02 DIAGNOSIS — R410 Disorientation, unspecified: Principal | ICD-10-CM

## 2023-01-02 DIAGNOSIS — Z87891 Personal history of nicotine dependence: Secondary | ICD-10-CM

## 2023-01-02 DIAGNOSIS — Z6841 Body Mass Index (BMI) 40.0 and over, adult: Secondary | ICD-10-CM

## 2023-01-02 DIAGNOSIS — N179 Acute kidney failure, unspecified: Secondary | ICD-10-CM | POA: Diagnosis present

## 2023-01-02 DIAGNOSIS — E876 Hypokalemia: Secondary | ICD-10-CM | POA: Diagnosis not present

## 2023-01-02 DIAGNOSIS — E7220 Disorder of urea cycle metabolism, unspecified: Secondary | ICD-10-CM

## 2023-01-02 DIAGNOSIS — Z880 Allergy status to penicillin: Secondary | ICD-10-CM

## 2023-01-02 DIAGNOSIS — Z888 Allergy status to other drugs, medicaments and biological substances status: Secondary | ICD-10-CM

## 2023-01-02 DIAGNOSIS — K219 Gastro-esophageal reflux disease without esophagitis: Secondary | ICD-10-CM | POA: Diagnosis present

## 2023-01-02 DIAGNOSIS — Z881 Allergy status to other antibiotic agents status: Secondary | ICD-10-CM

## 2023-01-02 DIAGNOSIS — Z79890 Hormone replacement therapy (postmenopausal): Secondary | ICD-10-CM

## 2023-01-02 DIAGNOSIS — D696 Thrombocytopenia, unspecified: Secondary | ICD-10-CM | POA: Diagnosis present

## 2023-01-02 DIAGNOSIS — R188 Other ascites: Secondary | ICD-10-CM | POA: Diagnosis present

## 2023-01-02 DIAGNOSIS — K7682 Hepatic encephalopathy: Secondary | ICD-10-CM | POA: Diagnosis not present

## 2023-01-02 DIAGNOSIS — I5032 Chronic diastolic (congestive) heart failure: Secondary | ICD-10-CM | POA: Diagnosis present

## 2023-01-02 DIAGNOSIS — G2581 Restless legs syndrome: Secondary | ICD-10-CM | POA: Diagnosis present

## 2023-01-02 DIAGNOSIS — Z66 Do not resuscitate: Secondary | ICD-10-CM | POA: Diagnosis present

## 2023-01-02 DIAGNOSIS — E871 Hypo-osmolality and hyponatremia: Secondary | ICD-10-CM | POA: Diagnosis present

## 2023-01-02 DIAGNOSIS — E66813 Obesity, class 3: Secondary | ICD-10-CM | POA: Diagnosis present

## 2023-01-02 DIAGNOSIS — K7460 Unspecified cirrhosis of liver: Secondary | ICD-10-CM | POA: Diagnosis present

## 2023-01-02 DIAGNOSIS — N939 Abnormal uterine and vaginal bleeding, unspecified: Secondary | ICD-10-CM | POA: Diagnosis present

## 2023-01-02 DIAGNOSIS — D689 Coagulation defect, unspecified: Secondary | ICD-10-CM | POA: Diagnosis present

## 2023-01-02 DIAGNOSIS — E860 Dehydration: Secondary | ICD-10-CM | POA: Diagnosis present

## 2023-01-02 DIAGNOSIS — H5462 Unqualified visual loss, left eye, normal vision right eye: Secondary | ICD-10-CM | POA: Diagnosis present

## 2023-01-02 DIAGNOSIS — E039 Hypothyroidism, unspecified: Secondary | ICD-10-CM | POA: Diagnosis present

## 2023-01-02 DIAGNOSIS — K7581 Nonalcoholic steatohepatitis (NASH): Secondary | ICD-10-CM | POA: Diagnosis present

## 2023-01-02 DIAGNOSIS — I110 Hypertensive heart disease with heart failure: Secondary | ICD-10-CM | POA: Diagnosis present

## 2023-01-02 DIAGNOSIS — G8929 Other chronic pain: Secondary | ICD-10-CM | POA: Diagnosis present

## 2023-01-02 DIAGNOSIS — E872 Acidosis, unspecified: Secondary | ICD-10-CM

## 2023-01-02 DIAGNOSIS — N938 Other specified abnormal uterine and vaginal bleeding: Secondary | ICD-10-CM | POA: Diagnosis present

## 2023-01-02 DIAGNOSIS — B961 Klebsiella pneumoniae [K. pneumoniae] as the cause of diseases classified elsewhere: Secondary | ICD-10-CM | POA: Diagnosis present

## 2023-01-02 DIAGNOSIS — Z79899 Other long term (current) drug therapy: Secondary | ICD-10-CM

## 2023-01-02 DIAGNOSIS — K7290 Hepatic failure, unspecified without coma: Secondary | ICD-10-CM | POA: Diagnosis present

## 2023-01-02 DIAGNOSIS — I951 Orthostatic hypotension: Secondary | ICD-10-CM | POA: Diagnosis present

## 2023-01-02 LAB — CBC WITH DIFFERENTIAL/PLATELET
Abs Immature Granulocytes: 0.01 10*3/uL (ref 0.00–0.07)
Basophils Absolute: 0 10*3/uL (ref 0.0–0.1)
Basophils Relative: 0 %
Eosinophils Absolute: 0.1 10*3/uL (ref 0.0–0.5)
Eosinophils Relative: 1 %
HCT: 29.7 % — ABNORMAL LOW (ref 36.0–46.0)
Hemoglobin: 9.3 g/dL — ABNORMAL LOW (ref 12.0–15.0)
Immature Granulocytes: 0 %
Lymphocytes Relative: 10 %
Lymphs Abs: 0.8 10*3/uL (ref 0.7–4.0)
MCH: 29.8 pg (ref 26.0–34.0)
MCHC: 31.3 g/dL (ref 30.0–36.0)
MCV: 95.2 fL (ref 80.0–100.0)
Monocytes Absolute: 0.3 10*3/uL (ref 0.1–1.0)
Monocytes Relative: 4 %
Neutro Abs: 6.2 10*3/uL (ref 1.7–7.7)
Neutrophils Relative %: 85 %
Platelets: 106 10*3/uL — ABNORMAL LOW (ref 150–400)
RBC: 3.12 MIL/uL — ABNORMAL LOW (ref 3.87–5.11)
RDW: 17.1 % — ABNORMAL HIGH (ref 11.5–15.5)
WBC Morphology: INCREASED
WBC: 7.3 10*3/uL (ref 4.0–10.5)
nRBC: 0 % (ref 0.0–0.2)

## 2023-01-02 LAB — COMPREHENSIVE METABOLIC PANEL
ALT: 20 U/L (ref 0–44)
AST: 33 U/L (ref 15–41)
Albumin: 2.9 g/dL — ABNORMAL LOW (ref 3.5–5.0)
Alkaline Phosphatase: 81 U/L (ref 38–126)
Anion gap: 7 (ref 5–15)
BUN: 20 mg/dL (ref 8–23)
CO2: 16 mmol/L — ABNORMAL LOW (ref 22–32)
Calcium: 8.2 mg/dL — ABNORMAL LOW (ref 8.9–10.3)
Chloride: 109 mmol/L (ref 98–111)
Creatinine, Ser: 1.16 mg/dL — ABNORMAL HIGH (ref 0.44–1.00)
GFR, Estimated: 52 mL/min — ABNORMAL LOW (ref >60)
Glucose, Bld: 120 mg/dL — ABNORMAL HIGH (ref 70–99)
Potassium: 4.7 mmol/L (ref 3.5–5.1)
Sodium: 132 mmol/L — ABNORMAL LOW (ref 135–145)
Total Bilirubin: 2.3 mg/dL — ABNORMAL HIGH (ref <1.2)
Total Protein: 5.7 g/dL — ABNORMAL LOW (ref 6.5–8.1)

## 2023-01-02 LAB — PROTIME-INR
INR: 2.2 — ABNORMAL HIGH (ref 0.8–1.2)
Prothrombin Time: 24.4 s — ABNORMAL HIGH (ref 11.4–15.2)

## 2023-01-02 LAB — APTT: aPTT: 47 s — ABNORMAL HIGH (ref 24–36)

## 2023-01-02 LAB — AMMONIA: Ammonia: 86 umol/L — ABNORMAL HIGH (ref 9–35)

## 2023-01-02 MED ORDER — LACTATED RINGERS IV BOLUS
1000.0000 mL | Freq: Once | INTRAVENOUS | Status: AC
  Administered 2023-01-02: 1000 mL via INTRAVENOUS

## 2023-01-02 MED ORDER — MORPHINE SULFATE (PF) 4 MG/ML IV SOLN
4.0000 mg | Freq: Once | INTRAVENOUS | Status: AC
  Administered 2023-01-02: 4 mg via INTRAVENOUS
  Filled 2023-01-02: qty 1

## 2023-01-02 MED ORDER — SODIUM CHLORIDE 0.9 % IV SOLN
2.0000 g | Freq: Once | INTRAVENOUS | Status: AC
  Administered 2023-01-03: 2 g via INTRAVENOUS
  Filled 2023-01-02: qty 20

## 2023-01-02 NOTE — ED Notes (Signed)
Attempted IV access. Unsuccessful. Provided patient with warm blankets. Another nurse asked to obtain IV access.

## 2023-01-02 NOTE — ED Triage Notes (Signed)
Pt has cirrhosis, pt son brought pt in and states she is confused, states this happens when she gets a UTI or her ammonia is high. Son states pt has been urinating more frequently. Pt states she has been having lower abdominal pain when she urinates. Pt has been forgetful today and stating things happened that did not. This all started today.

## 2023-01-02 NOTE — ED Provider Notes (Incomplete)
[place] EMERGENCY DEPARTMENT AT [facility] Provider Note   CSN: [id number] Arrival date & time: 01/02/23  2134     History {Add pertinent medical, surgical, social history, OB history to HPI:1} Chief Complaint  Patient presents with  . Weakness    [name] is a 67 y.o. female with past medical history significant for hypertension, thyroid disease, PE/DVT, non-alcoholic cirrhosis, depression, hyperammonemia presents to the ED with altered mental status.  Patient's son states that patient may have UTI or high ammonia.  She has been urinating more frequently.  Patient reports lower abdominal pain when she urinates.  Patient has been forgetful today and reporting things that did not happen.  Symptoms began today.  Patient's son also concerned that patient's right leg is red, hot to touch, and swollen.  Patient is not anticoagulated due to cirrhosis.  No definitive fever, but patient complaining of feeling cold.  Patient has also been consuming a large amount of water and Sprite today and continues to state she is thirsty.  Denies nausea, vomiting, diarrhea.        Home Medications Prior to Admission medications   Medication Sig Start Date End Date Taking? Authorizing Provider  atorvastatin (LIPITOR) 40 MG tablet Take 40 mg by mouth in the morning. 01/19/22   [provider]  colchicine 0.6 MG tablet Take 0.6 mg by mouth daily as needed (gout flares).    [provider]  cyanocobalamin (VITAMIN B12) 1000 MCG tablet Take 2,000 mcg by mouth every Friday.    [provider]  dicyclomine (BENTYL) 10 MG capsule Take 1 capsule (10 mg total) by mouth 3 (three) times daily as needed for spasms (abdominal cramping). 09/09/22   Lewie Chamber, MD  EPINEPHrine 0.3 mg/0.3 mL IJ SOAJ injection Inject 0.3 mg into the muscle as needed for anaphylaxis. 08/07/19   [provider]  famotidine (PEPCID) 20 MG tablet Take 20 mg by mouth in the morning.     [provider]  folic acid (FOLVITE) 1 MG tablet Take 1 tablet (1 mg total) by mouth daily. 06/16/22   Calvert Cantor, MD  furosemide (LASIX) 40 MG tablet Take 1 tablet (40 mg total) by mouth daily. Take additional 40 mg if there is a weight gain of 3 lbs in1 day or 5 lbs in 1 week. 12/17/22   Marinda Elk, MD  IRON-VITAMIN C PO Take 1 tablet by mouth daily with breakfast.    [provider]  lactulose (CHRONULAC) 10 GM/15ML solution Take 30 mLs (20 g total) by mouth 3 (three) times daily. Take to ensure that there is 2 loose bowel movement EVERY DAY Patient taking differently: Take 30 mLs by mouth 3 (three) times daily as needed (to ensure there are 2 loose bowel movements EVERY DAY). 10/31/22   Rolly Salter, MD  levothyroxine (SYNTHROID) 150 MCG tablet Take 150 mcg by mouth daily before breakfast. 10/26/21   [provider]  lidocaine (LIDODERM) 5 % Place 1 patch onto the skin daily. Remove & Discard patch within 12 hours or as directed by MD Patient taking differently: Place 1 patch onto the skin daily as needed (for pain- Remove & Discard patch within 12 hours or as directed by MD). 10/31/22   Rolly Salter, MD  methocarbamol (ROBAXIN) 500 MG tablet Take 500 mg by mouth in the morning. 03/04/22   [provider]  midodrine (PROAMATINE) 10 MG tablet Take 1 tablet (10 mg total) by mouth 3 (three) times  daily with meals. 09/09/22   Lewie Chamber, MD  naloxone [facility]) nasal spray 4 mg/0.1 mL Place 1 spray into the nose as needed (accidental overdose). 03/25/22   [provider]  nystatin cream (MYCOSTATIN) Apply topically 2 (two) times daily. Patient taking differently: Apply 1 Application topically 2 (two) times daily as needed for dry skin (or irritation). 05/22/22   Leatha Gilding, MD  nystatin powder Apply 1 Application topically 3 (three) times daily as needed (for irritation- affected areas).    [provider]  pantoprazole  (PROTONIX) 40 MG tablet Take 1 tablet (40 mg total) by mouth 2 (two) times daily before a meal. 09/09/22   Lewie Chamber, MD  promethazine (PHENERGAN) 12.5 MG tablet Take 12.5 mg by mouth every 6 (six) hours as needed for nausea or vomiting.    [provider]  rifaximin (XIFAXAN) 550 MG TABS tablet Take 550 mg by mouth 2 (two) times daily.    [provider]  rOPINIRole (REQUIP) 0.25 MG tablet Take 1 tablet (0.25 mg total) by mouth at bedtime. Patient taking differently: Take 0.25 mg by mouth daily. 09/09/22   Lewie Chamber, MD  spironolactone (ALDACTONE) 50 MG tablet Take 1 tablet (50 mg total) by mouth daily. 12/17/22   Marinda Elk, MD  tamsulosin (FLOMAX) 0.4 MG CAPS capsule Take 1 capsule (0.4 mg total) by mouth daily. 09/10/22   Lewie Chamber, MD  triamcinolone cream (KENALOG) 0.5 % Apply to affected area of right breast twice daily as directed. Patient taking differently: Apply 1 Application topically 2 (two) times daily as needed (for irritation- right breast). 05/10/22   Osvaldo Shipper, MD  TYLENOL 500 MG tablet Take 1,000 mg by mouth every 6 (six) hours as needed for mild pain (pain score 1-3) or headache.    [provider]  venlafaxine XR (EFFEXOR-XR) 75 MG 24 hr capsule Take 75 mg by mouth daily with breakfast. 08/17/21   [provider]  Vitamin D, Ergocalciferol, (DRISDOL) 50000 UNITS CAPS Take 50,000 Units by mouth every Wednesday.    [provider]      Allergies    Ace inhibitors, Ivp dye [iodinated contrast media], Desitin [zinc oxide], Gadolinium derivatives, Hydroxyzine, Chlorhexidine, Doxycycline, Penicillins, and Zofran [ondansetron]    Review of Systems   Review of Systems  Constitutional:  Negative for chills and fever.  Gastrointestinal:  Positive for abdominal pain. Negative for diarrhea, nausea and vomiting.  Genitourinary:  Positive for frequency.  Neurological:  Positive for weakness.  Psychiatric/Behavioral:   Positive for confusion.     Physical Exam Updated Vital Signs BP 116/71 (BP Location: Left Arm)   Pulse (!) 102   Temp 99.2 F (37.3 C) (Oral)   Resp (!) 22   Ht 5\' 2"  (1.575 m)   Wt 127.9 kg   SpO2 100%   BMI 51.58 kg/m  Physical Exam Vitals and nursing note reviewed.  Constitutional:      General: She is not in acute distress.    Appearance: She is ill-appearing.  HENT:     Mouth/Throat:     Mouth: Mucous membranes are moist.     Pharynx: Oropharynx is clear.  Cardiovascular:     Rate and Rhythm: Normal rate and regular rhythm.     Pulses: Normal pulses.     Heart sounds: Normal heart sounds.  Pulmonary:     Effort: Pulmonary effort is normal. Tachypnea present. No respiratory distress.     Breath sounds: Normal air entry. Wheezing present.  Abdominal:     General: Abdomen is flat. Bowel sounds are normal. There is no distension.     Palpations: Abdomen is soft.     Tenderness: There is abdominal tenderness in the suprapubic area.  Musculoskeletal:     Comments: RLE erythematous, swollen, with increased warmth and tenderness.  Skin on the medial aspect of the RLE is a deeper red color, dry, and appears irritated.   Skin:    General: Skin is warm and dry.     Capillary Refill: Capillary refill takes less than 2 seconds.  Neurological:     Mental Status: She is alert. Mental status is at baseline.  Psychiatric:        Mood and Affect: Mood normal.        Behavior: Behavior normal.     ED Results / Procedures / Treatments   Labs (all labs ordered are listed, but only abnormal results are displayed) Labs Reviewed  CULTURE, BLOOD (ROUTINE X 2)  CULTURE, BLOOD (ROUTINE X 2)  CBC WITH DIFFERENTIAL/PLATELET  URINALYSIS, W/ REFLEX TO CULTURE (INFECTION SUSPECTED)  COMPREHENSIVE METABOLIC PANEL  AMMONIA  PROTIME-INR  APTT  I-STAT CG4 LACTIC ACID, ED    EKG None  Radiology No results found.  Procedures Procedures  {Document cardiac monitor, telemetry  assessment procedure when appropriate:1}  Medications Ordered in ED Medications - No data to display  ED Course/ Medical Decision Making/ A&P   {   Click here for ABCD2, HEART and other calculatorsREFRESH Note before signing :1}                              Medical Decision Making Amount and/or Complexity of Data Reviewed Labs: ordered. Radiology: ordered.   This patient presents to the ED with chief complaint(s) of disorientation, chills, abdominal pain, right leg pain/redness with pertinent past medical history of cirrhosis, obesity, hypertension, thyroid disease.  The complaint involves an extensive differential diagnosis and also carries with it a high risk of complications and morbidity.    The differential diagnosis includes infectious etiology, UTI, sepsis, hyperammoniemia    The initial plan is to obtain sepsis workup  Additional history obtained: Additional history obtained from family, patient's son at bedside Records reviewed previous admission documents  Initial Assessment:   Exam significant for ill-appearing patient who is tachypneic and appears to be in pain.  Lungs with mild end-expiratory wheezing.  Heart rate is in the upper 90s.  Patient is afebrile currently.  Abdomen is soft with suprapubic tenderness.  There is a large ventral hernia present, this is chronic per son.  Right lower extremity with erythema, increased warmth, tenderness, and swelling.  Medial aspect of leg with erythematous rash/irritation.  Patient's skin is mildly jaundiced.    Independent ECG/labs interpretation:  The following labs were independently interpreted:  ***  Independent visualization and interpretation of imaging: I independently visualized the following imaging with scope of interpretation limited to determining acute life threatening conditions related to emergency care: ***, which revealed ***  Treatment and Reassessment: ***  Consultations obtained:   ***  Disposition:    ***  Social Determinants of Health:   Patient's {[id number]}  increases the complexity of managing their presentation   {Document critical care time when appropriate:1} {Document review of labs and clinical decision tools ie heart score, Chads2Vasc2 etc:1}  {Document your independent review of radiology images, and any outside records:1} {Document your discussion with family members, caretakers, and with consultants:1} {  Document social determinants of health affecting pt's care:1} {Document your decision making why or why not admission, treatments were needed:1} Final Clinical Impression(s) / ED Diagnoses Final diagnoses:  None    Rx / DC Orders ED Discharge Orders     None

## 2023-01-02 NOTE — ED Provider Notes (Signed)
[place] EMERGENCY DEPARTMENT AT [facility] Provider Note   CSN: [id number] Arrival date & time: 01/02/23  2134     History  Chief Complaint  Patient presents with   Weakness    [name] is a 67 y.o. female with past medical history significant for hypertension, thyroid disease, PE/DVT, non-alcoholic cirrhosis, depression, hyperammonemia presents to the ED with altered mental status.  Patient's son states that patient may have UTI or high ammonia.  She has been urinating more frequently.  Patient reports lower abdominal pain when she urinates.  Patient has been forgetful today and reporting things that did not happen.  Symptoms began today.  Patient's son also concerned that patient's right leg is red, hot to touch, and swollen.  Patient is not anticoagulated due to cirrhosis.  No definitive fever, but patient complaining of feeling cold.  Patient has also been consuming a large amount of water and Sprite today and continues to state she is thirsty.  Denies nausea, vomiting, diarrhea.        Home Medications Prior to Admission medications   Medication Sig Start Date End Date Taking? Authorizing Provider  atorvastatin (LIPITOR) 40 MG tablet Take 40 mg by mouth in the morning. 01/19/22   [provider]  colchicine 0.6 MG tablet Take 0.6 mg by mouth daily as needed (gout flares).    [provider]  cyanocobalamin (VITAMIN B12) 1000 MCG tablet Take 2,000 mcg by mouth every Friday.    [provider]  dicyclomine (BENTYL) 10 MG capsule Take 1 capsule (10 mg total) by mouth 3 (three) times daily as needed for spasms (abdominal cramping). 09/09/22   Lewie Chamber, MD  EPINEPHrine 0.3 mg/0.3 mL IJ SOAJ injection Inject 0.3 mg into the muscle as needed for anaphylaxis. 08/07/19   [provider]  famotidine (PEPCID) 20 MG tablet Take 20 mg by mouth in the morning.    [provider]  folic acid (FOLVITE) 1 MG tablet Take 1 tablet  (1 mg total) by mouth daily. 06/16/22   Calvert Cantor, MD  furosemide (LASIX) 40 MG tablet Take 1 tablet (40 mg total) by mouth daily. Take additional 40 mg if there is a weight gain of 3 lbs in1 day or 5 lbs in 1 week. 12/17/22   Marinda Elk, MD  IRON-VITAMIN C PO Take 1 tablet by mouth daily with breakfast.    [provider]  lactulose (CHRONULAC) 10 GM/15ML solution Take 30 mLs (20 g total) by mouth 3 (three) times daily. Take to ensure that there is 2 loose bowel movement EVERY DAY Patient taking differently: Take 30 mLs by mouth 3 (three) times daily as needed (to ensure there are 2 loose bowel movements EVERY DAY). 10/31/22   Rolly Salter, MD  levothyroxine (SYNTHROID) 150 MCG tablet Take 150 mcg by mouth daily before breakfast. 10/26/21   [provider]  lidocaine (LIDODERM) 5 % Place 1 patch onto the skin daily. Remove & Discard patch within 12 hours or as directed by MD Patient taking differently: Place 1 patch onto the skin daily as needed (for pain- Remove & Discard patch within 12 hours or as directed by MD). 10/31/22   Rolly Salter, MD  methocarbamol (ROBAXIN) 500 MG tablet Take 500 mg by mouth in the morning. 03/04/22   [provider]  midodrine (PROAMATINE) 10 MG tablet Take 1 tablet (10 mg total) by mouth 3 (three) times daily with meals. 09/09/22   Lewie Chamber, MD  naloxone (NARCAN) nasal spray 4 mg/0.1 mL Place 1 spray into the nose as needed (accidental overdose). 03/25/22   [provider]  nystatin cream (MYCOSTATIN) Apply topically 2 (two) times daily. Patient taking differently: Apply 1 Application topically 2 (two) times daily as needed for dry skin (or irritation). 05/22/22   Leatha Gilding, MD  nystatin powder Apply 1 Application topically 3 (three) times daily as needed (for irritation- affected areas).    [provider]  pantoprazole (PROTONIX) 40 MG tablet Take 1 tablet (40 mg total) by mouth 2 (two) times daily  before a meal. 09/09/22   Lewie Chamber, MD  promethazine (PHENERGAN) 12.5 MG tablet Take 12.5 mg by mouth every 6 (six) hours as needed for nausea or vomiting.    [provider]  rifaximin (XIFAXAN) 550 MG TABS tablet Take 550 mg by mouth 2 (two) times daily.    [provider]  rOPINIRole (REQUIP) 0.25 MG tablet Take 1 tablet (0.25 mg total) by mouth at bedtime. Patient taking differently: Take 0.25 mg by mouth daily. 09/09/22   Lewie Chamber, MD  spironolactone (ALDACTONE) 50 MG tablet Take 1 tablet (50 mg total) by mouth daily. 12/17/22   Marinda Elk, MD  tamsulosin (FLOMAX) 0.4 MG CAPS capsule Take 1 capsule (0.4 mg total) by mouth daily. 09/10/22   Lewie Chamber, MD  triamcinolone cream (KENALOG) 0.5 % Apply to affected area of right breast twice daily as directed. Patient taking differently: Apply 1 Application topically 2 (two) times daily as needed (for irritation- right breast). 05/10/22   Osvaldo Shipper, MD  TYLENOL 500 MG tablet Take 1,000 mg by mouth every 6 (six) hours as needed for mild pain (pain score 1-3) or headache.    [provider]  venlafaxine XR (EFFEXOR-XR) 75 MG 24 hr capsule Take 75 mg by mouth daily with breakfast. 08/17/21   [provider]  Vitamin D, Ergocalciferol, (DRISDOL) 50000 UNITS CAPS Take 50,000 Units by mouth every Wednesday.    [provider]      Allergies    Ace inhibitors, Ivp dye [iodinated contrast media], Desitin [zinc oxide], Gadolinium derivatives, Hydroxyzine, Chlorhexidine, Doxycycline, Penicillins, and Zofran [ondansetron]    Review of Systems   Review of Systems  Constitutional:  Negative for chills and fever.  Gastrointestinal:  Positive for abdominal pain. Negative for diarrhea, nausea and vomiting.  Genitourinary:  Positive for frequency.  Neurological:  Positive for weakness.  Psychiatric/Behavioral:  Positive for confusion.     Physical Exam Updated Vital Signs BP (!) 148/83    Pulse 96   Temp 99.2 F (37.3 C) (Oral)   Resp (!) 24   Ht 5\' 2"  (1.575 m)   Wt 127.9 kg   SpO2 100%   BMI 51.58 kg/m  Physical Exam Vitals and nursing note reviewed.  Constitutional:      General: She is not in acute distress.    Appearance: She is ill-appearing.     Comments: Patient is moaning and appears to be in pain.  HENT:     Mouth/Throat:     Mouth: Mucous membranes are moist.     Pharynx: Oropharynx is clear.  Cardiovascular:     Rate and Rhythm: Normal rate and regular rhythm.     Pulses: Normal pulses.     Heart sounds: Normal heart sounds.  Pulmonary:     Effort: Pulmonary effort is normal. Tachypnea present. No respiratory distress.     Breath sounds: Normal air entry. Wheezing present.  Abdominal:     General: Abdomen is flat. Bowel sounds are normal. There is no distension.     Palpations: Abdomen is soft.     Tenderness: There is abdominal tenderness in the suprapubic area.  Musculoskeletal:     Comments: RLE erythematous, swollen, with increased warmth and tenderness.  Skin on the medial aspect of the RLE is a deeper red color, dry, and appears irritated.   Skin:    General: Skin is warm and dry.     Capillary Refill: Capillary refill takes less than 2 seconds.  Neurological:     Mental Status: She is alert. Mental status is at baseline.     GCS: GCS eye subscore is 4. GCS verbal subscore is 4. GCS motor subscore is 6.     Comments: Patient is oriented to self and place. She continues to repeat phrases.  At times, patient stares and appears disoriented.  Patient is vague in description of her symptoms.   Psychiatric:        Mood and Affect: Mood normal.        Behavior: Behavior normal.     ED Results / Procedures / Treatments   Labs (all labs ordered are listed, but only abnormal results are displayed) Labs Reviewed  CBC WITH DIFFERENTIAL/PLATELET - Abnormal; Notable for the following components:      Result Value   RBC 3.12 (*)    Hemoglobin 9.3  (*)    HCT 29.7 (*)    RDW 17.1 (*)    Platelets 106 (*)    All other components within normal limits  COMPREHENSIVE METABOLIC PANEL - Abnormal; Notable for the following components:   Sodium 132 (*)    CO2 16 (*)    Glucose, Bld 120 (*)    Creatinine, Ser 1.16 (*)    Calcium 8.2 (*)    Total Protein 5.7 (*)    Albumin 2.9 (*)    Total Bilirubin 2.3 (*)    GFR, Estimated 52 (*)    All other components within normal limits  AMMONIA - Abnormal; Notable for the following components:   Ammonia 86 (*)    All other components within normal limits  PROTIME-INR - Abnormal; Notable for the following components:   Prothrombin Time 24.4 (*)    INR 2.2 (*)    All other components within normal limits  APTT - Abnormal; Notable for the following components:   aPTT 47 (*)    All other components within normal limits  CULTURE, BLOOD (ROUTINE X 2)  CULTURE, BLOOD (ROUTINE X 2)  URINALYSIS, W/ REFLEX TO CULTURE (INFECTION SUSPECTED)  I-STAT CG4 LACTIC ACID, ED  I-STAT CG4 LACTIC ACID, ED    EKG None  Radiology No results found.  Procedures Procedures    Medications Ordered in ED Medications  cefTRIAXone (ROCEPHIN) 2 g in sodium chloride 0.9 % 100 mL IVPB (has no administration in time range)  morphine (PF) 4 MG/ML injection 4 mg (4 mg Intravenous Given 01/02/23 2323)  lactated ringers bolus 1,000 mL (1,000 mLs Intravenous New Bag/Given 01/02/23 2328)    ED Course/ Medical Decision Making/ A&P                                 Medical Decision Making Amount and/or Complexity of Data Reviewed Labs: ordered. Radiology: ordered.   This patient presents to the ED with chief complaint(s) of disorientation, chills, abdominal pain, right leg pain/redness  with pertinent past medical history of cirrhosis, obesity, hypertension, thyroid disease.  The complaint involves an extensive differential diagnosis and also carries with it a high risk of complications and morbidity.    The  differential diagnosis includes infectious etiology, UTI, sepsis, hyperammoniemia    The initial plan is to obtain sepsis workup  Additional history obtained: Additional history obtained from family, patient's son at bedside Records reviewed previous admission documents  Initial Assessment:   Exam significant for ill-appearing patient who is tachypneic and appears to be in pain.  Lungs with mild end-expiratory wheezing.  Heart rate is in the upper 90s.  Patient is afebrile currently.  Abdomen is soft with suprapubic tenderness.  There is a large ventral hernia present, this is chronic per son.  Right lower extremity with erythema, increased warmth, tenderness, and swelling.  Medial aspect of leg with erythematous rash/irritation.  Patient's skin is mildly jaundiced.    Independent ECG/labs interpretation:  The following labs were independently interpreted:  Lactic 3.77.  CBC with anemia, no leukocytosis.   Metabolic panel with mild hyponatremia.  Cr is above patient's baseline.  Ammonia elevated at 86.  Independent visualization and interpretation of imaging: I independently visualized the following imaging with scope of interpretation limited to determining acute life threatening conditions related to emergency care: chest x-ray, which revealed low lung volumes and bilateral hazy appearance.  Treatment and Reassessment: Patient has evidence of cellulitis in her right lower extremity.  There is also concern for UTI.  Will cover for both with 2g IV Rocephin.   In and out cath ordered for UA.  Disposition:   12:10 AM Care of patient transferred to Barrie Dunker, PA-C at the end of my shift as the patient will require reassessment once labs/imaging have resulted. Patient presentation, ED course, and plan of care discussed with review of all pertinent labs and imaging. Please see his/her note for further details regarding further ED course and disposition. Plan at time of handoff is follow up  on UA and admit patient. This may be altered or completely changed at the discretion of the oncoming team pending results of further workup.           Final Clinical Impression(s) / ED Diagnoses Final diagnoses:  Disorientation  Lactic acidosis  Hyperammonemia (HCC)  Cellulitis of right lower extremity    Rx / DC Orders ED Discharge Orders     None         Lenard Simmer, PA-C 01/03/23 0010    Derwood Kaplan, MD 01/03/23 1517

## 2023-01-02 NOTE — ED Notes (Signed)
Lactic Critical Value of 3.77. Provider was notified.

## 2023-01-03 ENCOUNTER — Inpatient Hospital Stay (HOSPITAL_COMMUNITY): Payer: Medicare (Managed Care)

## 2023-01-03 DIAGNOSIS — Z79890 Hormone replacement therapy (postmenopausal): Secondary | ICD-10-CM | POA: Diagnosis not present

## 2023-01-03 DIAGNOSIS — I110 Hypertensive heart disease with heart failure: Secondary | ICD-10-CM | POA: Diagnosis present

## 2023-01-03 DIAGNOSIS — E871 Hypo-osmolality and hyponatremia: Secondary | ICD-10-CM | POA: Diagnosis present

## 2023-01-03 DIAGNOSIS — K7682 Hepatic encephalopathy: Secondary | ICD-10-CM | POA: Diagnosis present

## 2023-01-03 DIAGNOSIS — E039 Hypothyroidism, unspecified: Secondary | ICD-10-CM | POA: Diagnosis present

## 2023-01-03 DIAGNOSIS — E872 Acidosis, unspecified: Secondary | ICD-10-CM | POA: Diagnosis present

## 2023-01-03 DIAGNOSIS — I350 Nonrheumatic aortic (valve) stenosis: Secondary | ICD-10-CM | POA: Diagnosis not present

## 2023-01-03 DIAGNOSIS — D689 Coagulation defect, unspecified: Secondary | ICD-10-CM | POA: Diagnosis present

## 2023-01-03 DIAGNOSIS — I5032 Chronic diastolic (congestive) heart failure: Secondary | ICD-10-CM | POA: Diagnosis present

## 2023-01-03 DIAGNOSIS — R188 Other ascites: Secondary | ICD-10-CM | POA: Diagnosis present

## 2023-01-03 DIAGNOSIS — N939 Abnormal uterine and vaginal bleeding, unspecified: Secondary | ICD-10-CM | POA: Diagnosis not present

## 2023-01-03 DIAGNOSIS — Z66 Do not resuscitate: Secondary | ICD-10-CM | POA: Diagnosis present

## 2023-01-03 DIAGNOSIS — K219 Gastro-esophageal reflux disease without esophagitis: Secondary | ICD-10-CM | POA: Diagnosis present

## 2023-01-03 DIAGNOSIS — E876 Hypokalemia: Secondary | ICD-10-CM | POA: Diagnosis not present

## 2023-01-03 DIAGNOSIS — K7290 Hepatic failure, unspecified without coma: Secondary | ICD-10-CM | POA: Diagnosis not present

## 2023-01-03 DIAGNOSIS — I951 Orthostatic hypotension: Secondary | ICD-10-CM | POA: Diagnosis present

## 2023-01-03 DIAGNOSIS — L03115 Cellulitis of right lower limb: Secondary | ICD-10-CM | POA: Diagnosis present

## 2023-01-03 DIAGNOSIS — N179 Acute kidney failure, unspecified: Secondary | ICD-10-CM | POA: Diagnosis present

## 2023-01-03 DIAGNOSIS — B961 Klebsiella pneumoniae [K. pneumoniae] as the cause of diseases classified elsewhere: Secondary | ICD-10-CM | POA: Diagnosis present

## 2023-01-03 DIAGNOSIS — G2581 Restless legs syndrome: Secondary | ICD-10-CM | POA: Diagnosis present

## 2023-01-03 DIAGNOSIS — R7881 Bacteremia: Secondary | ICD-10-CM | POA: Diagnosis present

## 2023-01-03 DIAGNOSIS — I060 Rheumatic aortic stenosis: Secondary | ICD-10-CM | POA: Diagnosis present

## 2023-01-03 DIAGNOSIS — E860 Dehydration: Secondary | ICD-10-CM | POA: Diagnosis present

## 2023-01-03 DIAGNOSIS — K7581 Nonalcoholic steatohepatitis (NASH): Secondary | ICD-10-CM | POA: Diagnosis present

## 2023-01-03 DIAGNOSIS — Z6841 Body Mass Index (BMI) 40.0 and over, adult: Secondary | ICD-10-CM | POA: Diagnosis not present

## 2023-01-03 DIAGNOSIS — D696 Thrombocytopenia, unspecified: Secondary | ICD-10-CM | POA: Diagnosis present

## 2023-01-03 LAB — ECHOCARDIOGRAM COMPLETE
AR max vel: 0.97 cm2
AV Area VTI: 1.1 cm2
AV Area mean vel: 1.21 cm2
AV Mean grad: 36 mm[Hg]
AV Peak grad: 66.9 mm[Hg]
Ao pk vel: 4.09 m/s
Area-P 1/2: 4.29 cm2
Calc EF: 63.9 %
Height: 62 in
MV VTI: 2.42 cm2
S' Lateral: 3.2 cm
Single Plane A2C EF: 66.9 %
Single Plane A4C EF: 62.8 %
Weight: 4512 [oz_av]

## 2023-01-03 LAB — RESPIRATORY PANEL BY PCR

## 2023-01-03 LAB — BASIC METABOLIC PANEL
Anion gap: 7 (ref 5–15)
BUN: 20 mg/dL (ref 8–23)
CO2: 16 mmol/L — ABNORMAL LOW (ref 22–32)
Calcium: 7.9 mg/dL — ABNORMAL LOW (ref 8.9–10.3)
Chloride: 109 mmol/L (ref 98–111)
Creatinine, Ser: 0.94 mg/dL (ref 0.44–1.00)
GFR, Estimated: >60 mL/min (ref >60)
Glucose, Bld: 82 mg/dL (ref 70–99)
Potassium: 4.6 mmol/L (ref 3.5–5.1)
Sodium: 132 mmol/L — ABNORMAL LOW (ref 135–145)

## 2023-01-03 LAB — CBC
HCT: 24.8 % — ABNORMAL LOW (ref 36.0–46.0)
Hemoglobin: 7.8 g/dL — ABNORMAL LOW (ref 12.0–15.0)
MCH: 29.3 pg (ref 26.0–34.0)
MCHC: 31.5 g/dL (ref 30.0–36.0)
MCV: 93.2 fL (ref 80.0–100.0)
Platelets: 80 10*3/uL — ABNORMAL LOW (ref 150–400)
RBC: 2.66 MIL/uL — ABNORMAL LOW (ref 3.87–5.11)
RDW: 17 % — ABNORMAL HIGH (ref 11.5–15.5)
WBC: 5.4 10*3/uL (ref 4.0–10.5)
nRBC: 0 % (ref 0.0–0.2)

## 2023-01-03 LAB — URINALYSIS, W/ REFLEX TO CULTURE (INFECTION SUSPECTED)
Bilirubin Urine: NEGATIVE
Glucose, UA: NEGATIVE mg/dL
Ketones, ur: NEGATIVE mg/dL
Nitrite: NEGATIVE
Protein, ur: 30 mg/dL — AB
Specific Gravity, Urine: 1.021 (ref 1.005–1.030)
pH: 5 (ref 5.0–8.0)

## 2023-01-03 LAB — TSH: TSH: 2.912 u[IU]/mL (ref 0.350–4.500)

## 2023-01-03 LAB — HEPATIC FUNCTION PANEL
ALT: 16 U/L (ref 0–44)
AST: 25 U/L (ref 15–41)
Albumin: 2.3 g/dL — ABNORMAL LOW (ref 3.5–5.0)
Alkaline Phosphatase: 62 U/L (ref 38–126)
Bilirubin, Direct: 0.6 mg/dL — ABNORMAL HIGH (ref 0.0–0.2)
Indirect Bilirubin: 1.5 mg/dL — ABNORMAL HIGH (ref 0.3–0.9)
Total Bilirubin: 2.1 mg/dL — ABNORMAL HIGH (ref <1.2)
Total Protein: 4.7 g/dL — ABNORMAL LOW (ref 6.5–8.1)

## 2023-01-03 LAB — LACTIC ACID, PLASMA: Lactic Acid, Venous: 2 mmol/L (ref 0.5–1.9)

## 2023-01-03 LAB — VITAMIN B12: Vitamin B-12: 483 pg/mL (ref 180–914)

## 2023-01-03 LAB — HEMOGLOBIN A1C
Hgb A1c MFr Bld: 4.4 % — ABNORMAL LOW (ref 4.8–5.6)
Mean Plasma Glucose: 79.58 mg/dL

## 2023-01-03 LAB — I-STAT CG4 LACTIC ACID, ED
Lactic Acid, Venous: 2.7 mmol/L (ref 0.5–1.9)
Lactic Acid, Venous: 3.8 mmol/L (ref 0.5–1.9)

## 2023-01-03 LAB — BRAIN NATRIURETIC PEPTIDE: B Natriuretic Peptide: 469.4 pg/mL — ABNORMAL HIGH (ref 0.0–100.0)

## 2023-01-03 LAB — PHOSPHORUS: Phosphorus: 3 mg/dL (ref 2.5–4.6)

## 2023-01-03 LAB — MAGNESIUM: Magnesium: 1.6 mg/dL — ABNORMAL LOW (ref 1.7–2.4)

## 2023-01-03 MED ORDER — LEVOTHYROXINE SODIUM 50 MCG PO TABS
150.0000 ug | ORAL_TABLET | Freq: Every day | ORAL | Status: DC
  Administered 2023-01-03 – 2023-01-13 (×11): 150 ug via ORAL
  Filled 2023-01-03 (×12): qty 1

## 2023-01-03 MED ORDER — ATORVASTATIN CALCIUM 40 MG PO TABS
40.0000 mg | ORAL_TABLET | Freq: Every day | ORAL | Status: DC
  Administered 2023-01-03 – 2023-01-13 (×11): 40 mg via ORAL
  Filled 2023-01-03 (×11): qty 1

## 2023-01-03 MED ORDER — THIAMINE MONONITRATE 100 MG PO TABS
100.0000 mg | ORAL_TABLET | Freq: Every day | ORAL | Status: DC
  Administered 2023-01-03 – 2023-01-13 (×11): 100 mg via ORAL
  Filled 2023-01-03 (×11): qty 1

## 2023-01-03 MED ORDER — DICYCLOMINE HCL 10 MG PO CAPS
10.0000 mg | ORAL_CAPSULE | Freq: Three times a day (TID) | ORAL | Status: DC | PRN
  Administered 2023-01-06 – 2023-01-12 (×4): 10 mg via ORAL
  Filled 2023-01-03 (×4): qty 1

## 2023-01-03 MED ORDER — FAMOTIDINE 20 MG PO TABS
20.0000 mg | ORAL_TABLET | Freq: Every day | ORAL | Status: DC
  Administered 2023-01-03 – 2023-01-13 (×11): 20 mg via ORAL
  Filled 2023-01-03 (×11): qty 1

## 2023-01-03 MED ORDER — PANTOPRAZOLE SODIUM 40 MG PO TBEC
40.0000 mg | DELAYED_RELEASE_TABLET | Freq: Two times a day (BID) | ORAL | Status: DC
  Administered 2023-01-03 – 2023-01-13 (×21): 40 mg via ORAL
  Filled 2023-01-03 (×21): qty 1

## 2023-01-03 MED ORDER — FERROUS SULFATE 325 (65 FE) MG PO TABS
325.0000 mg | ORAL_TABLET | Freq: Every day | ORAL | Status: DC
  Administered 2023-01-04 – 2023-01-13 (×10): 325 mg via ORAL
  Filled 2023-01-03 (×10): qty 1

## 2023-01-03 MED ORDER — SODIUM CHLORIDE 0.9% FLUSH
3.0000 mL | Freq: Two times a day (BID) | INTRAVENOUS | Status: DC
  Administered 2023-01-03 – 2023-01-13 (×17): 3 mL via INTRAVENOUS

## 2023-01-03 MED ORDER — ENOXAPARIN SODIUM 40 MG/0.4ML IJ SOSY
40.0000 mg | PREFILLED_SYRINGE | INTRAMUSCULAR | Status: DC
  Administered 2023-01-03 – 2023-01-06 (×4): 40 mg via SUBCUTANEOUS
  Filled 2023-01-03 (×5): qty 0.4

## 2023-01-03 MED ORDER — FOLIC ACID 1 MG PO TABS: 1.0000 mg | ORAL_TABLET | Freq: Every day | ORAL | Status: DC

## 2023-01-03 MED ORDER — SPIRONOLACTONE 25 MG PO TABS
50.0000 mg | ORAL_TABLET | Freq: Every day | ORAL | Status: DC
  Administered 2023-01-03 – 2023-01-05 (×3): 50 mg via ORAL
  Filled 2023-01-03 (×3): qty 2

## 2023-01-03 MED ORDER — LACTATED RINGERS IV BOLUS
1000.0000 mL | Freq: Once | INTRAVENOUS | Status: AC
  Administered 2023-01-03: 1000 mL via INTRAVENOUS

## 2023-01-03 MED ORDER — VENLAFAXINE HCL ER 75 MG PO CP24
75.0000 mg | ORAL_CAPSULE | Freq: Every day | ORAL | Status: DC
  Administered 2023-01-03 – 2023-01-13 (×11): 75 mg via ORAL
  Filled 2023-01-03 (×11): qty 1

## 2023-01-03 MED ORDER — LACTULOSE 10 GM/15ML PO SOLN
20.0000 g | Freq: Every day | ORAL | Status: DC
Start: 2023-01-03 — End: 2023-01-07
  Administered 2023-01-03 – 2023-01-07 (×5): 20 g via ORAL
  Filled 2023-01-03 (×5): qty 30

## 2023-01-03 MED ORDER — ACETAMINOPHEN 500 MG PO TABS
500.0000 mg | ORAL_TABLET | Freq: Four times a day (QID) | ORAL | Status: DC | PRN
  Administered 2023-01-12: 500 mg via ORAL
  Filled 2023-01-03: qty 1

## 2023-01-03 MED ORDER — VITAMIN C 500 MG PO TABS
250.0000 mg | ORAL_TABLET | Freq: Every day | ORAL | Status: DC
  Administered 2023-01-04 – 2023-01-13 (×10): 250 mg via ORAL
  Filled 2023-01-03 (×10): qty 1

## 2023-01-03 MED ORDER — LACTULOSE 10 GM/15ML PO SOLN
30.0000 g | Freq: Once | ORAL | Status: AC
  Administered 2023-01-03: 30 g via ORAL
  Filled 2023-01-03: qty 60

## 2023-01-03 MED ORDER — SODIUM CHLORIDE 0.9 % IV SOLN
2.0000 g | INTRAVENOUS | Status: DC
Start: 2023-01-03 — End: 2023-01-06
  Administered 2023-01-03 – 2023-01-05 (×3): 2 g via INTRAVENOUS
  Filled 2023-01-03 (×3): qty 20

## 2023-01-03 MED ORDER — FOLIC ACID 1 MG PO TABS
1.0000 mg | ORAL_TABLET | Freq: Every day | ORAL | Status: DC
  Administered 2023-01-03 – 2023-01-13 (×11): 1 mg via ORAL
  Filled 2023-01-03 (×11): qty 1

## 2023-01-03 MED ORDER — TAMSULOSIN HCL 0.4 MG PO CAPS
0.4000 mg | ORAL_CAPSULE | Freq: Every day | ORAL | Status: DC
  Administered 2023-01-03 – 2023-01-13 (×11): 0.4 mg via ORAL
  Filled 2023-01-03 (×11): qty 1

## 2023-01-03 MED ORDER — VITAMIN B-12 1000 MCG PO TABS
2000.0000 ug | ORAL_TABLET | ORAL | Status: DC
  Administered 2023-01-06 – 2023-01-13 (×2): 2000 ug via ORAL
  Filled 2023-01-03 (×6): qty 2

## 2023-01-03 MED ORDER — RIFAXIMIN 550 MG PO TABS
550.0000 mg | ORAL_TABLET | Freq: Two times a day (BID) | ORAL | Status: DC
  Administered 2023-01-03 – 2023-01-13 (×21): 550 mg via ORAL
  Filled 2023-01-03 (×22): qty 1

## 2023-01-03 MED ORDER — HYDROCODONE-ACETAMINOPHEN 10-325 MG PO TABS
1.0000 | ORAL_TABLET | Freq: Three times a day (TID) | ORAL | Status: DC | PRN
  Administered 2023-01-03 – 2023-01-13 (×18): 1 via ORAL
  Filled 2023-01-03 (×18): qty 1

## 2023-01-03 MED ORDER — ROPINIROLE HCL 0.25 MG PO TABS
0.2500 mg | ORAL_TABLET | Freq: Every day | ORAL | Status: DC
  Administered 2023-01-03 – 2023-01-12 (×10): 0.25 mg via ORAL
  Filled 2023-01-03 (×11): qty 1

## 2023-01-03 MED ORDER — IRON-VITAMIN C 100-250 MG PO TABS: ORAL_TABLET | Freq: Every day | ORAL | Status: DC

## 2023-01-03 MED ORDER — MORPHINE SULFATE (PF) 4 MG/ML IV SOLN
4.0000 mg | Freq: Once | INTRAVENOUS | Status: AC
  Administered 2023-01-03: 4 mg via INTRAVENOUS
  Filled 2023-01-03: qty 1

## 2023-01-03 NOTE — Progress Notes (Signed)
  Echocardiogram 2D Echocardiogram has been performed.  Ocie Doyne RDCS 01/03/2023, 4:05 PM

## 2023-01-03 NOTE — ED Notes (Signed)
Fluids complete istat lactic collected

## 2023-01-03 NOTE — Progress Notes (Signed)
PHARMACY - PHYSICIAN COMMUNICATION CRITICAL VALUE ALERT - BLOOD CULTURE IDENTIFICATION (BCID)  [name] is an 67 y.o. female who presented to [facility] on 01/02/2023 with a chief complaint of confusion and leg pain/swelling  Assessment:   12/23 Bcx: 1/3 bottles K.pneumo, no resistance  Name of physician (or Provider) Contacted: Dr. Tyson Babinski  Current antibiotics: ceftriaxone 2 g  Changes to prescribed antibiotics recommended: none  Results for orders placed or performed during the hospital encounter of 01/02/23  Blood Culture ID Panel (Reflexed) (Collected: 01/02/2023 10:51 PM)  Result Value Ref Range   Enterococcus faecalis NOT DETECTED NOT DETECTED   Enterococcus Faecium NOT DETECTED NOT DETECTED   Listeria monocytogenes NOT DETECTED NOT DETECTED   Staphylococcus species NOT DETECTED NOT DETECTED   Staphylococcus aureus (BCID) NOT DETECTED NOT DETECTED   Staphylococcus epidermidis NOT DETECTED NOT DETECTED   Staphylococcus lugdunensis NOT DETECTED NOT DETECTED   Streptococcus species NOT DETECTED NOT DETECTED   Streptococcus agalactiae NOT DETECTED NOT DETECTED   Streptococcus pneumoniae NOT DETECTED NOT DETECTED   Streptococcus pyogenes NOT DETECTED NOT DETECTED   A.calcoaceticus-baumannii NOT DETECTED NOT DETECTED   Bacteroides fragilis NOT DETECTED NOT DETECTED   Enterobacterales NOT DETECTED NOT DETECTED   Enterobacter cloacae complex NOT DETECTED NOT DETECTED   Escherichia coli NOT DETECTED NOT DETECTED   Klebsiella aerogenes NOT DETECTED NOT DETECTED   Klebsiella oxytoca NOT DETECTED NOT DETECTED   Klebsiella pneumoniae NOT DETECTED NOT DETECTED   Proteus species NOT DETECTED NOT DETECTED   Salmonella species NOT DETECTED NOT DETECTED   Serratia marcescens NOT DETECTED NOT DETECTED   Haemophilus influenzae NOT DETECTED NOT DETECTED   Neisseria meningitidis NOT DETECTED NOT DETECTED   Pseudomonas aeruginosa NOT DETECTED NOT DETECTED   Stenotrophomonas  maltophilia NOT DETECTED NOT DETECTED   Candida albicans NOT DETECTED NOT DETECTED   Candida auris NOT DETECTED NOT DETECTED   Candida glabrata NOT DETECTED NOT DETECTED   Candida krusei NOT DETECTED NOT DETECTED   Candida parapsilosis NOT DETECTED NOT DETECTED   Candida tropicalis NOT DETECTED NOT DETECTED   Cryptococcus neoformans/gattii NOT DETECTED NOT DETECTED    Pricilla Riffle, PharmD, BCPS Clinical Pharmacist 01/03/2023 5:45 PM

## 2023-01-03 NOTE — Progress Notes (Signed)
Same day note  [name] is a 67 y.o. 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  As per the family patient was having 4-6 bowel movements and was taking lactulose with rifaximin but started having increasing confusion with right lower extremity pain and bilateral lower extremity swelling.  Patient then decided to come to the hospital for further evaluation and treatment.   Patient seen and examined at bedside.  Patient was admitted to the hospital for confusion and right lower extremity redness and swelling.  At the time of my evaluation, patient complains of right lower extremity redness and swelling and pain.  Physical examination reveals obese female, left corneal opacity, Communicative, mildly somnolent, redness of the right thigh with some induration, bilateral lower extremity trace edema.  Laboratory data and imaging was reviewed   Assessment and plan.  Decompensated nonalcoholic steatohepatitis cirrhosis Recurrent hepatic encephalopathy Ascites. History of recurrent encephalopathy.  Likely exacerbated by dehydration possible infection and use of narcotics.  MELD-Na is 23 on presentation.  Last EGD 10/24/ with no EV.  Continue spironolactone. If persistent encephalopathy despite diarrhea we will consult GI.  As per son at bedside appears to be more alert awake today.  Possible RLE cellulitis Bilateral lower extremity edema  History of moderate aortic stenosis, diastolic heart failure Increasing erythema right lower extremity.  Unclear if truly cellulitis versus skin  changes related to bilateral edema.  Edema secondary to cirrhosis or possibly heart failure with preserved ejection fraction and aortic stenosis.  Continue Rocephin IV.  Continue intake and output charting Daily weights.  Lactic acidosis  Improved with IV fluids.  Lactic acid of 2.0.   AKI stage I Improved.  Creatinine of 0.9.    Non anion gap acidosis Likely related to chronic diarrhea, with component from lactic acidosis.  Latest bicarb of 16.   Mild hyponatremia, asymptomatic Sodium of 132.   Recent URI symptoms Check respiratory viral panel.   Abdominal pain/tenderness History of ventral hernia KUB with large and small bowel dilation despite chronic diarrhea.  -Monitor stool counts   No Charge  Signed,  Tenny Craw, MD [facility]

## 2023-01-03 NOTE — ED Notes (Signed)
No BM on this shift [id number]

## 2023-01-03 NOTE — Plan of Care (Signed)

## 2023-01-03 NOTE — H&P (Signed)
History and Physical    Ibtihaj Gasco ZOX:[id number] DOB: 1955/05/26 DOA: 01/02/2023  PCP: [name], MD   Patient coming from: Home   Chief Complaint:  Chief Complaint  Patient presents with   Weakness    HPI: History limited due to patient's confusion, provided mainly by patient's son over the phone Shellina Kolman is a 67 y.o. 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  Patient son reports that overall since discharge her mental status has been well-managed with lactulose once daily targeting 4-6 bowel movements, in addition to the rifaximin.  This morning noted that she had mild increase in confusion.  Appears he was more concerned about her complaints of right lower extremity pain, and bilateral lower extremity swelling.  Lasix was changed from daily to prn last admission but had not used prn despite increasing swelling.  Took a single dose yesterday at 40 mg.  Today also having chills, and recent runny nose.  No other infectious symptoms.  Has had polydipsia recently.   Review of Systems:  ROS complete and negative except as marked above   Allergies  Allergen Reactions   Ace Inhibitors Anaphylaxis and Swelling   Ivp Dye [Iodinated Contrast Media] Anaphylaxis   Desitin [Zinc Oxide] Rash and Other (See Comments)    Worsens rash   Gadolinium Derivatives Other (See Comments)    Reaction??   Hydroxyzine Other (See Comments)    Affects the mind   Chlorhexidine Rash and Other (See Comments)    WORSENS RASHES   Doxycycline Rash   Penicillins Rash   Zofran [Ondansetron]  Rash    Prior to Admission medications   Medication Sig Start Date End Date Taking? Authorizing Provider  atorvastatin (LIPITOR) 40 MG tablet Take 40 mg by mouth in the morning. 01/19/22   [provider]  colchicine 0.6 MG tablet Take 0.6 mg by mouth daily as needed (gout flares).    [provider]  cyanocobalamin (VITAMIN B12) 1000 MCG tablet Take 2,000 mcg by mouth every Friday.    [provider]  dicyclomine (BENTYL) 10 MG capsule Take 1 capsule (10 mg total) by mouth 3 (three) times daily as needed for spasms (abdominal cramping). 09/09/22   Lewie Chamber, MD  EPINEPHrine 0.3 mg/0.3 mL IJ SOAJ injection Inject 0.3 mg into the muscle as needed for anaphylaxis. 08/07/19   [provider]  famotidine (PEPCID) 20 MG tablet Take 20 mg by mouth in the morning.    [provider]  folic acid (FOLVITE) 1 MG tablet Take 1 tablet (1 mg total) by mouth daily. 06/16/22   Calvert Cantor, MD  furosemide (LASIX) 40 MG tablet Take 1 tablet (40 mg total) by mouth daily. Take additional 40 mg if there is a weight gain of 3 lbs in1 day or 5 lbs in 1 week. 12/17/22   Marinda Elk, MD  IRON-VITAMIN C PO Take 1 tablet by mouth daily with breakfast.    [provider]  lactulose (CHRONULAC) 10 GM/15ML solution Take 30 mLs (20 g total) by mouth 3 (three) times daily. Take to ensure that there is 2 loose bowel movement EVERY DAY Patient taking differently: Take 30 mLs by mouth 3 (three)  times daily as needed (to ensure there are 2 loose bowel movements EVERY DAY). 10/31/22   Rolly Salter, MD  levothyroxine (SYNTHROID) 150 MCG tablet Take 150 mcg by mouth daily before breakfast. 10/26/21   [provider]  lidocaine (LIDODERM) 5 % Place 1 patch onto the skin daily. Remove & Discard patch within 12 hours or as directed by MD Patient taking differently: Place 1 patch onto the skin daily as needed (for pain- Remove & Discard patch within 12 hours or as  directed by MD). 10/31/22   Rolly Salter, MD  methocarbamol (ROBAXIN) 500 MG tablet Take 500 mg by mouth in the morning. 03/04/22   [provider]  midodrine (PROAMATINE) 10 MG tablet Take 1 tablet (10 mg total) by mouth 3 (three) times daily with meals. 09/09/22   Lewie Chamber, MD  naloxone [facility]) nasal spray 4 mg/0.1 mL Place 1 spray into the nose as needed (accidental overdose). 03/25/22   [provider]  nystatin cream (MYCOSTATIN) Apply topically 2 (two) times daily. Patient taking differently: Apply 1 Application topically 2 (two) times daily as needed for dry skin (or irritation). 05/22/22   Leatha Gilding, MD  nystatin powder Apply 1 Application topically 3 (three) times daily as needed (for irritation- affected areas).    [provider]  pantoprazole (PROTONIX) 40 MG tablet Take 1 tablet (40 mg total) by mouth 2 (two) times daily before a meal. 09/09/22   Lewie Chamber, MD  promethazine (PHENERGAN) 12.5 MG tablet Take 12.5 mg by mouth every 6 (six) hours as needed for nausea or vomiting.    [provider]  rifaximin (XIFAXAN) 550 MG TABS tablet Take 550 mg by mouth 2 (two) times daily.    [provider]  rOPINIRole (REQUIP) 0.25 MG tablet Take 1 tablet (0.25 mg total) by mouth at bedtime. Patient taking differently: Take 0.25 mg by mouth daily. 09/09/22   Lewie Chamber, MD  spironolactone (ALDACTONE) 50 MG tablet Take 1 tablet (50 mg total) by mouth daily. 12/17/22   Marinda Elk, MD  tamsulosin (FLOMAX) 0.4 MG CAPS capsule Take 1 capsule (0.4 mg total) by mouth daily. 09/10/22   Lewie Chamber, MD  triamcinolone cream (KENALOG) 0.5 % Apply to affected area of right breast twice daily as directed. Patient taking differently: Apply 1 Application topically 2 (two) times daily as needed (for irritation- right breast). 05/10/22   Osvaldo Shipper, MD  TYLENOL 500 MG tablet Take 1,000 mg by mouth every 6 (six) hours as needed for mild  pain (pain score 1-3) or headache.    [provider]  venlafaxine XR (EFFEXOR-XR) 75 MG 24 hr capsule Take 75 mg by mouth daily with breakfast. 08/17/21   [provider]  Vitamin D, Ergocalciferol, (DRISDOL) 50000 UNITS CAPS Take 50,000 Units by mouth every Wednesday.    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   Obesity    Pulmonary  emboli (HCC) 01/10/2009   Pulmonary embolism (HCC)    Short gut syndrome    Thyroid disease     Past Surgical History:  Procedure Laterality Date   CESAREAN SECTION WITH BILATERAL TUBAL LIGATION     CHOLECYSTECTOMY     COLON SURGERY     ESOPHAGOGASTRODUODENOSCOPY (EGD) WITH PROPOFOL N/A 10/19/2022   Procedure: ESOPHAGOGASTRODUODENOSCOPY (EGD) WITH PROPOFOL;  Surgeon: Kerin Salen, MD;  Location: [facility];  Service: Gastroenterology;  Laterality: N/A;   HERNIA REPAIR     ILEOSTOMY     ILEOSTOMY CLOSURE     IR KYPHO THORACIC WITH BONE BIOPSY  08/31/2022   IR KYPHO THORACIC WITH BONE BIOPSY  10/27/2022   IR RADIOLOGIST EVAL & MGMT  09/27/2022   KIDNEY STONE SURGERY     WRIST SURGERY       reports that she quit smoking about 38 years ago. Her smoking use included cigarettes. She has been exposed to tobacco smoke. She has never used smokeless tobacco. She reports that she does not drink alcohol and does not use drugs.  History reviewed. No pertinent family history.   Physical Exam: Vitals:   01/02/23 2315 01/03/23 0049 01/03/23 0130 01/03/23  0439  BP:  136/67 (!) 134/41   Pulse: 96 92 90   Resp: (!) 24 20 (!) 23   Temp:  99.1 F (37.3 C)  98.7 F (37.1 C)  TempSrc:    Oral  SpO2: 100% 95% 98%   Weight:      Height:        Gen: Awake, alert, chronically ill-appearing HEENT: Chronic opacification of the left cornea CV: Regular, normal S1, S2, harsh 3/6 SEM Resp: Normal WOB, rales in the bases, otherwise clear Abd: Obese, ventral hernia, hyperactive/stuttering bowel sounds, moderate tenderness to palpation, no rebound, guarding, rigidity MSK: Symmetric, there is 2-3+ pitting edema bilaterally Skin: Right lower extremity is more erythematous distally, slightly increased warmth compared to the left Neuro: Alert and interactive, oriented to self, hospital (not to [place] [facility]), Christmas time (not to December or 2024).  No focal deficits, moving all extremities Psych: Appears confused   Data review:   Labs reviewed, notable for:   Lactate 3.8 -> 2.7 NA 132 Bicarb 16, anion gap 7 Creatinine 1.16, baseline 0.8 T. bili 2.3 Ammonia 86 WBC 7, hemoglobin 9, platelet 106 UA contaminated, positive hyaline cast   Micro:  Results for orders placed or performed during the hospital encounter of 10/17/22  Culture, blood (Routine X 2) w Reflex to ID Panel     Status: None   Collection Time: 10/17/22 12:30 AM   Specimen: BLOOD  Result Value Ref Range Status   Specimen Description   Final    BLOOD LEFT ANTECUBITAL Performed at [facility], [address] [place], [place] [address]    Special Requests   Final    BOTTLES DRAWN AEROBIC AND ANAEROBIC Blood Culture results may not be optimal due to an inadequate volume of blood received in culture bottles Performed at [facility], [address], [place], [place] [address]    Culture   Final    NO GROWTH 5 DAYS Performed at [facility], [address], [place], [place] [address]    Report Status 10/23/2022 FINAL  Final   Culture, blood (Routine X 2) w Reflex to ID Panel     Status: Abnormal   Collection Time: 10/18/22  3:08 PM   Specimen: BLOOD RIGHT HAND  Result Value  Ref Range Status   Specimen Description   Final    BLOOD RIGHT HAND Performed at [facility], [address] [place], [place] [address]    Special Requests   Final    BOTTLES DRAWN AEROBIC AND ANAEROBIC Blood Culture results may not be optimal due to an inadequate volume of blood received in culture bottles Performed at [facility], [address], [place], [place] [address]    Culture  Setup Time   Final    GRAM POSITIVE COCCI IN CLUSTERS AEROBIC BOTTLE ONLY CRITICAL RESULT CALLED TO, READ BACK BY AND VERIFIED WITH: PHARMD ABBY ELLINGTON [id number] AT 1442 BY CM    Culture (A)  Final    STAPHYLOCOCCUS EPIDERMIDIS THE SIGNIFICANCE OF ISOLATING THIS ORGANISM FROM A SINGLE SET OF BLOOD CULTURES WHEN MULTIPLE SETS ARE DRAWN IS UNCERTAIN. PLEASE NOTIFY THE MICROBIOLOGY DEPARTMENT WITHIN ONE WEEK IF SPECIATION AND SENSITIVITIES ARE REQUIRED. Performed at [facility], [address] [place], [place] [address]    Report Status 10/20/2022 FINAL  Final  Blood Culture ID Panel (Reflexed)     Status: Abnormal   Collection Time: 10/18/22  3:08 PM  Result Value Ref Range Status   Enterococcus faecalis NOT DETECTED NOT DETECTED Final   Enterococcus Faecium NOT DETECTED NOT DETECTED Final   Listeria monocytogenes NOT DETECTED NOT DETECTED Final   Staphylococcus species DETECTED (A) NOT DETECTED Final    Comment: CRITICAL RESULT CALLED TO, READ BACK BY AND VERIFIED WITH: PHARMD ABBY ELLINGTON [id number] AT 1442 BY CM    Staphylococcus aureus (BCID) NOT DETECTED NOT DETECTED Final   Staphylococcus epidermidis DETECTED (A) NOT DETECTED Final    Comment: Methicillin (oxacillin) resistant coagulase negative staphylococcus. Possible blood culture contaminant (unless isolated from more than one blood culture draw or clinical  case suggests pathogenicity). No antibiotic treatment is indicated for blood  culture contaminants. CRITICAL RESULT CALLED TO, READ BACK BY AND VERIFIED WITH: PHARMD ABBY ELLINGTON [id number] AT 1442 BY CM    Staphylococcus lugdunensis NOT DETECTED NOT DETECTED Final   Streptococcus species NOT DETECTED NOT DETECTED Final   Streptococcus agalactiae NOT DETECTED NOT DETECTED Final   Streptococcus pneumoniae NOT DETECTED NOT DETECTED Final   Streptococcus pyogenes NOT DETECTED NOT DETECTED Final   A.calcoaceticus-baumannii NOT DETECTED NOT DETECTED Final   Bacteroides fragilis NOT DETECTED NOT DETECTED Final   Enterobacterales NOT DETECTED NOT DETECTED Final   Enterobacter cloacae complex NOT DETECTED NOT DETECTED Final   Escherichia coli NOT DETECTED NOT DETECTED Final   Klebsiella aerogenes NOT DETECTED NOT DETECTED Final   Klebsiella oxytoca NOT DETECTED NOT DETECTED Final   Klebsiella pneumoniae NOT DETECTED NOT DETECTED Final   Proteus species NOT DETECTED NOT DETECTED Final   Salmonella species NOT DETECTED NOT DETECTED Final   Serratia marcescens NOT DETECTED NOT DETECTED Final   Haemophilus influenzae NOT DETECTED NOT DETECTED Final   Neisseria meningitidis NOT DETECTED NOT DETECTED Final   Pseudomonas aeruginosa NOT DETECTED NOT DETECTED Final   Stenotrophomonas maltophilia NOT DETECTED NOT DETECTED Final   Candida albicans NOT DETECTED NOT DETECTED Final   Candida auris NOT DETECTED NOT DETECTED Final   Candida glabrata NOT DETECTED NOT DETECTED Final   Candida krusei NOT DETECTED NOT DETECTED Final   Candida parapsilosis NOT DETECTED NOT DETECTED Final   Candida tropicalis NOT DETECTED NOT DETECTED Final   Cryptococcus neoformans/gattii NOT DETECTED NOT DETECTED Final   Methicillin resistance mecA/C DETECTED (A) NOT DETECTED Final  Comment: CRITICAL RESULT CALLED TO, READ BACK BY AND VERIFIED WITH: PHARMD ABBY [place] [id number] AT 1442 BY CM Performed at [facility], [address] [place], [place] [address]   Culture, blood (Routine X 2) w Reflex to ID Panel     Status: None   Collection Time: 10/21/22  7:23 PM   Specimen: BLOOD  Result Value Ref Range Status   Specimen Description   Final    BLOOD BLOOD RIGHT HAND Performed at [facility], [address] [place], [place] [address]    Special Requests   Final    BOTTLES DRAWN AEROBIC ONLY Blood Culture adequate volume Performed at [facility], [address], [place], [place] [address]    Culture   Final    NO GROWTH 5 DAYS Performed at [facility], [address], [place], [place] [address]    Report Status 10/26/2022 FINAL  Final  Culture, blood (Routine X 2) w Reflex to ID Panel     Status: None   Collection Time: 10/21/22  7:23 PM   Specimen: BLOOD  Result Value Ref Range Status   Specimen Description   Final    BLOOD BLOOD RIGHT HAND Performed at [facility], [address] [place], [place] [address]    Special Requests   Final    BOTTLES DRAWN AEROBIC ONLY Blood Culture adequate volume Performed at [facility], [address], [place], [place] [address]    Culture   Final    NO GROWTH 5 DAYS Performed at [facility], [address], [place], [place] [address]    Report Status 10/26/2022 FINAL  Final    Imaging reviewed:  DG Abd 1 View Result Date: 01/03/2023 CLINICAL DATA:  67 year old female with confusion, ileus. EXAM: ABDOMEN - 1 VIEW COMPARISON:  CT Abdomen and Pelvis 10/01/2022 and earlier. FINDINGS: Stable cholecystectomy clips. Lung bases appear stable, negative. Augmented T12 compression fracture since September. Bulky calcified uterine fibroids redemonstrated in the pelvis. Gas-filled bowel loops in the abdomen and pelvis do appear to be both large and small bowel, while the stomach seems decompressed. Osteopenia. Stable abdominal and pelvic visceral contours. No definite  pneumoperitoneum on these two views which may be supine. IMPRESSION: 1. Gas-filled small and large bowel bowel loops is compatible with ileus. Distal large bowel obstruction felt less likely. Follow-up abdominal radiographs may be valuable. 2. Bulky calcified uterine fibroids. Osteopenia. Augmented T12 compression fracture. Electronically Signed   By: Odessa Fleming M.D.   On: 01/03/2023 05:48   DG Chest Portable 1 View Result Date: 01/03/2023 CLINICAL DATA:  Confusion, tachycardia, tachypnea EXAM: PORTABLE CHEST 1 VIEW COMPARISON:  09/30/2022 FINDINGS: Cardiomegaly. Low lung volumes. Mild diffuse interstitial prominence throughout the lungs. No effusions. No acute bony abnormality. Aortic atherosclerosis. IMPRESSION: 1. Cardiomegaly. 2. Low lung volumes. Diffuse interstitial prominence could reflect edema or atypical infection. Electronically Signed   By: Charlett Nose M.D.   On: 01/03/2023 00:26     ED Course:  Treated with 1 L IV fluid, ceftriaxone for cellulitis, morphine, lactulose   Assessment/Plan:  67 y.o. 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 Treating for recurrent HE, lactic acidosis, possible cellulitis RLE.   Decompensated Nash cirrhosis Recurrent hepatic encephalopathy History of recurrent encephalopathy with etiology this admission likely dehydration, possible infection, medication effect from morphine (son reports she was oriented prior to morphine in the ED).  -MELD-Na is 23 -HE: Present on admission, continue lactulose daily goal 4-6 BM (higher with  history of chronic diarrhea), rifaximin twice daily -EV: Last EGD 10/'24 no EV present -Ascites/volume management: Continue spironolactone 50 mg daily.  Hold Lasix in setting of possible volume depletion -HCC screening: No lesions seen on CT abdomen pelvis in 9/' 24 -If she has persistent encephalopathy despite above and chronic diarrhea would consult GI inpatient, not contacted overnight.  Possible RLE cellulitis Bilateral lower extremity edema  History of moderate aortic stenosis, diastolic heart failure Per son recently complaining of right lower extremity pain RLE is erythematous compared to the left and increased warmth.  Unclear if truly cellulitis versus skin changes related to bilateral edema although considering asymmetric nature we will treat as cellulitis.  Her underlying lower extremity swelling appears to have worsened when off Lasix, however currently her volume status is mixed.  Despite her edema feels she is volume depleted with improving lactic acidosis with IV fluids.  Edema may be related to volume retention with her history of cirrhosis or possibly heart failure with preserved ejection fraction and aortic stenosis. -Continue ceftriaxone 2 g IV q 24 hr for cellulitis of RLE -Check BNP.  Due to severity of her aortic stenosis, repeat echo -Daily weight, ins and outs; dry weight appears to be near to 220 lb = 100 kg (suspect ED weight erroneously high, needs standing weight)  -Pending lactate with additional fluids, evaluation per above may actually need diuresis.    Lactic acidosis -improving Suspect related to hypovolemia with chronic diarrhea, recent reinitiation of Lasix.  Possibly infectious with cellulitis. - Status post 1 L IV fluid, given additional 1 L IV fluid and repeat lactate -Antibiotics per above  AKI stage I Baseline creatinine approximately 0.8, elevated to 1.1 on admission.  Per above she has a mixed volume status with peripheral edema, although lactic acidosis  improving with fluids.  Positive hyaline casts on UA may suggest volume down. -Initial management with IV fluid per above, attention to evaluation for heart failure, weight trend.  May ultimately need diuresis  Non anion gap acidosis Likely related to chronic diarrhea, with component from lactic acidosis. -Management of lactulose per above, lactic acidosis per above.  Mild hyponatremia, asymptomatic Suspect related to cirrhosis - Trend BMP  Recent URI symptoms -Check RVP  Abdominal pain/tenderness History of ventral hernia KUB with large and small bowel dilation despite chronic diarrhea.  -Monitor stool counts  Chronic medical problems: History of hypotension on midodrine: Hold home midodrine and is currently hypertensive.  May need to resume at lower dose Cerebral atherosclerosis: Not on antiplatelet.  Continue home atorvastatin History of PE/DVT: Off anticoagulation Short gut: History of prior colectomy and ileostomy with subsequent takedown Hypothyroidism: Check TSH, continue home Levothyroxine.  IDA, thrombocytopenia: Blood counts stable from prior hx MDRO infection: contact precautions  Mood disorder: Continue home venlafaxine RLS: Continue home ropinirole GERD: Chronic abdominal pain: Continue home Bentyl, famotidine, pantoprazole ?  Urinary retention: Continue home tamsulosin left eye blindness  MELD 3.0: 24 at 01/02/2023 10:57 PM MELD-Na: 23 at 01/02/2023 10:57 PM Calculated from: Serum Creatinine: 1.16 mg/dL at 29/56/2130 86:57 PM Serum Sodium: 132 mmol/L at 01/02/2023 10:57 PM Total Bilirubin: 2.3 mg/dL at 84/69/6295  10:57 PM Serum Albumin: 2.9 g/dL at 18/84/1660 63:01 PM INR(ratio): 2.2 at 01/02/2023 10:57 PM Age at listing (hypothetical): 67 years Sex: Female at 01/02/2023 10:57 PM    Body mass index is 51.58 kg/m.  Class III obesity affecting medical care per above  DVT prophylaxis:  Lovenox Code Status:  DNR with Intubation; confirmed with patient's  son Diet:  Diet Orders (From admission, onward)     Start     Ordered   01/03/23 0508  Diet 2 gram sodium Room service appropriate? Yes; Fluid consistency: Thin  Diet effective now       Question Answer Comment  Room service appropriate? Yes   Fluid consistency: Thin      01/03/23 0518           Family Communication: Yes discussed with son over the phone Consults: None Admission status:   Inpatient, Telemetry bed  Severity of Illness: The appropriate patient status for this patient is INPATIENT. Inpatient status is judged to be reasonable and necessary in order to provide the required intensity of service to ensure the patient's safety. The patient's presenting symptoms, physical exam findings, and initial radiographic and laboratory data in the context of their chronic comorbidities is felt to place them at high risk for further clinical deterioration. Furthermore, it is not anticipated that the patient will be medically stable for discharge from the hospital within 2 midnights of admission.   * I certify that at the point of admission it is my clinical judgment that the patient will require inpatient hospital care spanning beyond 2 midnights from the point of admission due to high intensity of service, high risk for further deterioration and high frequency of surveillance required.*   Dolly Rias, MD [facility]  How to contact the [facility] Attending or Consulting provider 7A - 7P or covering provider during after hours 7P -7A, for this patient.  Check the care team in [facility] and look for a) attending/consulting [facility] provider listed and b) the [facility] team listed Log into [email or web address] and use [place]'s universal password to access. If you do not have the password, please contact the hospital operator. Locate the [facility] provider you are looking for under [facility] and page to a number that you can be directly reached. If you still have difficulty reaching the provider,  please page the [facility] (Director on Call) for the Hospitalists listed on amion for assistance.  01/03/2023, 6:08 AM

## 2023-01-03 NOTE — ED Notes (Signed)
Patient provided enough urine to send to lab.

## 2023-01-03 NOTE — ED Notes (Signed)
Patient aware she needs to provide a urine sample. Patient asked to hold on on in and out until her fluids are complete for her to try to give a urine sample

## 2023-01-03 NOTE — ED Notes (Signed)
ED TO INPATIENT HANDOFF REPORT  Name/Age/Gender [name] 67 y.o. female  Code Status    Code Status Orders  (From admission, onward)           Start     Ordered   01/03/23 0518  Do not attempt resuscitation (DNR) Pre-Arrest Interventions Desired  Continuous       Question Answer Comment  If pulseless and not breathing No CPR or chest compressions.   In Pre-Arrest Conditions (Patient Has Pulse and Is Breathing) May intubate, use advanced airway interventions and cardioversion/ACLS medications if appropriate or indicated. May transfer to ICU.   Consent: Discussion documented in EHR or advanced directives reviewed      01/03/23 0518           Code Status History     Date Active Date Inactive Code Status Order ID Comments User Context   12/10/2022 1424 12/17/2022 1551 Limited: Do not attempt resuscitation (DNR) -DNR-LIMITED -Do Not Intubate/DNI  [id number]  Bobette Mo, MD ED   11/30/2022 2214 12/05/2022 1739 Limited: Do not attempt resuscitation (DNR) -DNR-LIMITED -Do Not Intubate/DNI  [id number]  Anselm Jungling, DO ED   10/30/2022 1051 10/31/2022 1630 Do not attempt resuscitation (DNR) PRE-ARREST INTERVENTIONS DESIRED [id number]  Rolly Salter, MD Inpatient   10/27/2022 1308 10/29/2022 1228 Full Code [id number]  Bennie Dallas, MD Inpatient   10/17/2022 1801 10/27/2022 1308 Do not attempt resuscitation (DNR) PRE-ARREST INTERVENTIONS DESIRED [id number]  Alwyn Ren, MD ED   09/30/2022 1247 10/12/2022 1639 Do not attempt resuscitation (DNR) PRE-ARREST INTERVENTIONS DESIRED [id number]  Bobette Mo, MD ED   08/15/2022 2212 09/09/2022 1909 DNR [id number]  Ranee Gosselin, MD Inpatient   08/11/2022 1109 08/15/2022 2211 DNR [id number]  Norton Blizzard, NP ED   06/12/2022 2222 06/15/2022 1908 Full Code [id number]  Orland Mustard, MD Inpatient   05/16/2022 2308 05/22/2022 1922 Full Code [id number]  Rometta Emery, MD ED   05/01/2022 0022 05/10/2022 2002 Full Code [id number]  Anselm Jungling, DO ED       Home/SNF/Other Home  Chief Complaint Hepatic encephalopathy (HCC) [K76.82]  Level of Care/Admitting Diagnosis ED Disposition     ED Disposition  Admit   Condition  --   Comment  Hospital Area: [facility] [[id number]]  Level of Care: Telemetry [5]  Admit to tele based on following criteria: Other see comments  Comments: possible sepsis  May admit patient to [facility] or [facility] if equivalent level of care is available:: Yes  Covid Evaluation: Asymptomatic - no recent exposure (last 10 days) testing not required  Diagnosis: Hepatic encephalopathy (HCC) [572.2.ICD-9-CM]  Admitting Physician: Dolly Rias [[id number]]  Attending Physician: Dolly Rias [[id number]]  Certification:: I certify this patient will need inpatient services for at least 2 midnights  Expected Medical Readiness: 01/06/2023          Medical History 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   Hydroxyzine Other (See Comments)    Affects the mind   Chlorhexidine Rash and Other (See Comments)    WORSENS RASHES    Doxycycline Rash   Penicillins Rash   Zofran [Ondansetron] Rash    IV Location/Drains/Wounds Patient Lines/Drains/Airways Status     Active Line/Drains/Airways     Name Placement date Placement time Site Days   Peripheral IV 01/02/23 20 G 2.5" Anterior;Distal;Right;Upper Arm 01/02/23  2252  Arm  1   Wound / Incision (Open or Dehisced) 05/02/22 (ITD) Intertriginous Dermatitis Breast Left;Lower 05/02/22  0200  Breast  246   Wound / Incision (Open or Dehisced) 05/02/22 (ITD) Intertriginous Dermatitis Groin Bilateral 05/02/22  0200  Groin  246   Wound / Incision (Open or Dehisced) 05/02/22 (ITD) Intertriginous Dermatitis Knee Posterior;Bilateral 05/02/22  0200  Knee  246   Wound / Incision (Open or Dehisced) 10/18/22 Non-pressure wound;Irritant Dermatitis (Moisture Associated Skin Damage);Other (Comment) Coccyx Mid;Upper 10/18/22  2000  Coccyx  77            Labs/Imaging Results for orders placed or performed during the hospital encounter of 01/02/23 (from the past 48 hours)  CBC with Differential     Status: Abnormal   Collection Time: 01/02/23 10:57 PM  Result Value Ref Range   WBC 7.3 4.0 - 10.5 K/uL   RBC 3.12 (L) 3.87 - 5.11 MIL/uL   Hemoglobin 9.3 (L) 12.0 - 15.0 g/dL   HCT 25.3 (L) 66.4 - 40.3 %   MCV 95.2 80.0 - 100.0 fL   MCH 29.8 26.0 - 34.0 pg   MCHC 31.3 30.0 - 36.0 g/dL   RDW 47.4 (H) 25.9 - 56.3 %   Platelets 106 (L) 150 - 400 K/uL   nRBC 0.0 0.0 - 0.2 %   Neutrophils Relative % 85 %   Neutro Abs 6.2 1.7 - 7.7 K/uL   Lymphocytes Relative 10 %   Lymphs Abs 0.8 0.7 - 4.0 K/uL   Monocytes Relative 4 %   Monocytes Absolute 0.3 0.1 - 1.0 K/uL   Eosinophils Relative 1 %   Eosinophils Absolute 0.1 0.0 - 0.5 K/uL   Basophils Relative 0 %   Basophils Absolute 0.0 0.0 - 0.1 K/uL   WBC Morphology INCREASED BANDS (>20% BANDS)    Immature Granulocytes 0 %   Abs Immature Granulocytes 0.01 0.00 - 0.07 K/uL   Ovalocytes PRESENT     Comment: Performed at [facility], [address], [place], [place] [address]  Comprehensive metabolic panel     Status: Abnormal   Collection Time: 01/02/23 10:57 PM  Result Value Ref Range   Sodium 132 (L) 135 - 145 mmol/L   Potassium 4.7 3.5 - 5.1 mmol/L   Chloride 109 98 - 111 mmol/L   CO2 16 (L) 22 - 32 mmol/L   Glucose, Bld 120 (H) 70 - 99 mg/dL    Comment: Glucose reference range applies only to samples taken after fasting for at least 8 hours.   BUN 20 8 - 23 mg/dL   Creatinine, Ser 3.32 (H) 0.44 -  1.00 mg/dL   Calcium 8.2 (L) 8.9 - 10.3 mg/dL   Total Protein 5.7 (L) 6.5 - 8.1 g/dL   Albumin 2.9 (L) 3.5 - 5.0 g/dL   AST 33 15 - 41 U/L   ALT 20 0 - 44 U/L   Alkaline Phosphatase 81 38 - 126 U/L   Total Bilirubin 2.3 (H) <1.2 mg/dL   GFR, Estimated 52 (L) >60 mL/min    Comment: (NOTE) Calculated using the CKD-EPI Creatinine Equation (2021)    Anion gap 7 5 - 15    Comment: Performed at [facility], [address] [place], [place] [address]  Ammonia     Status: Abnormal   Collection Time: 01/02/23 10:57 PM  Result Value Ref Range   Ammonia 86 (H) 9 - 35 umol/L    Comment: Performed at [facility], [address] [place], [place] [address]  Protime-INR     Status: Abnormal   Collection Time: 01/02/23 10:57 PM  Result Value Ref Range   Prothrombin Time 24.4 (H) 11.4 - 15.2 seconds   INR 2.2 (H) 0.8 - 1.2    Comment: (NOTE) INR goal varies based on device and disease states. Performed at [facility], [address] [place], [place] [address]   APTT     Status: Abnormal   Collection Time: 01/02/23 10:57 PM  Result Value Ref Range   aPTT 47 (H) 24 - 36 seconds    Comment:        IF BASELINE aPTT IS ELEVATED, SUGGEST PATIENT RISK ASSESSMENT BE USED TO DETERMINE APPROPRIATE ANTICOAGULANT THERAPY. Performed at [facility], [address] [place], [place] [address]   I-Stat Lactic Acid, ED     Status:  Abnormal   Collection Time: 01/02/23 11:03 PM  Result Value Ref Range   Lactic Acid, Venous 3.8 (HH) 0.5 - 1.9 mmol/L   Comment NOTIFIED PHYSICIAN   I-Stat Lactic Acid, ED     Status: Abnormal   Collection Time: 01/03/23 12:54 AM  Result Value Ref Range   Lactic Acid, Venous 2.7 (HH) 0.5 - 1.9 mmol/L   Comment NOTIFIED PHYSICIAN   Urinalysis, w/ Reflex to Culture (Infection Suspected) -Urine, Clean Catch     Status: Abnormal   Collection Time: 01/03/23  2:54 AM  Result Value Ref Range   Specimen Source URINE, CLEAN CATCH    Color, Urine AMBER (A) YELLOW    Comment: BIOCHEMICALS MAY BE AFFECTED BY COLOR   APPearance CLOUDY (A) CLEAR   Specific Gravity, Urine 1.021 1.005 - 1.030   pH 5.0 5.0 - 8.0   Glucose, UA NEGATIVE NEGATIVE mg/dL   Hgb urine dipstick LARGE (A) NEGATIVE   Bilirubin Urine NEGATIVE NEGATIVE   Ketones, ur NEGATIVE NEGATIVE mg/dL   Protein, ur 30 (A) NEGATIVE mg/dL   Nitrite NEGATIVE NEGATIVE   Leukocytes,Ua SMALL (A) NEGATIVE   RBC / HPF 6-10 0 - 5 RBC/hpf   WBC, UA 6-10 0 - 5 WBC/hpf    Comment:        Reflex urine culture not performed if WBC <=10, OR if Squamous epithelial cells >5. If Squamous epithelial cells >5 suggest recollection.    Bacteria, UA FEW (A) NONE SEEN   Squamous Epithelial / HPF 21-50 0 - 5 /HPF   Mucus PRESENT    Hyaline Casts, UA PRESENT     Comment: Performed at [facility], [address] [place], [place] [address]   DG Abd  1 View Result Date: 01/03/2023 CLINICAL DATA:  67 year old female with confusion, ileus. EXAM: ABDOMEN - 1 VIEW COMPARISON:  CT Abdomen and Pelvis 10/01/2022 and earlier. FINDINGS: Stable cholecystectomy clips. Lung bases appear stable, negative. Augmented T12 compression fracture since September. Bulky calcified uterine fibroids redemonstrated in the pelvis. Gas-filled bowel loops in the abdomen and pelvis do appear to be both large and small bowel, while the stomach seems decompressed.  Osteopenia. Stable abdominal and pelvic visceral contours. No definite pneumoperitoneum on these two views which may be supine. IMPRESSION: 1. Gas-filled small and large bowel bowel loops is compatible with ileus. Distal large bowel obstruction felt less likely. Follow-up abdominal radiographs may be valuable. 2. Bulky calcified uterine fibroids. Osteopenia. Augmented T12 compression fracture. Electronically Signed   By: Odessa Fleming M.D.   On: 01/03/2023 05:48   DG Chest Portable 1 View Result Date: 01/03/2023 CLINICAL DATA:  Confusion, tachycardia, tachypnea EXAM: PORTABLE CHEST 1 VIEW COMPARISON:  09/30/2022 FINDINGS: Cardiomegaly. Low lung volumes. Mild diffuse interstitial prominence throughout the lungs. No effusions. No acute bony abnormality. Aortic atherosclerosis. IMPRESSION: 1. Cardiomegaly. 2. Low lung volumes. Diffuse interstitial prominence could reflect edema or atypical infection. Electronically Signed   By: Charlett Nose M.D.   On: 01/03/2023 00:26    Pending Labs Unresulted Labs (From admission, onward)     Start     Ordered   01/03/23 0618  Hemoglobin A1c  Once,   R        01/03/23 0617   01/03/23 0617  Hepatic function panel  Once,   R        01/03/23 0616   01/03/23 0617  Magnesium  Once,   R        01/03/23 0616   01/03/23 0617  Phosphorus  Once,   R        01/03/23 0616   01/03/23 0617  Vitamin B12  Once,   R        01/03/23 0616   01/03/23 0616  Basic metabolic panel  Once,   R        01/03/23 0616   01/03/23 0616  Brain natriuretic peptide  Once,   R        01/03/23 0616   01/03/23 0616  CBC  Once,   R        01/03/23 0616   01/03/23 0616  TSH  Add-on,   AD        01/03/23 0653   01/03/23 0600  Lactic acid, plasma  (Lactic Acid)  ONCE - STAT,   STAT       Comments: Draw after bolus complete    01/03/23 0459   01/03/23 0511  Respiratory (~20 pathogens) panel by PCR  (Respiratory panel by PCR (~20 pathogens, ~24 hr TAT)  w precautions)  Once,   R        01/03/23  0518   01/02/23 2211  Blood Culture (routine x 2)  (Undifferentiated presentation (screening labs and basic nursing orders))  BLOOD CULTURE X 2,   STAT      01/02/23 2211            Vitals/Pain Today's Vitals   01/03/23 0130 01/03/23 0315 01/03/23 0439 01/03/23 0630  BP: (!) 134/41   131/66  Pulse: 90   86  Resp: (!) 23   17  Temp:   98.7 F (37.1 C) 98.1 F (36.7 C)  TempSrc:   Oral   SpO2: 98%   98%  Weight:  Height:      PainSc:  10-Worst pain ever      Isolation Precautions Contact precautions  Medications Medications  enoxaparin (LOVENOX) injection 40 mg (has no administration in time range)  sodium chloride flush (NS) 0.9 % injection 3 mL (has no administration in time range)  acetaminophen (TYLENOL) tablet 500 mg (has no administration in time range)  folic acid (FOLVITE) tablet 1 mg (has no administration in time range)  thiamine (VITAMIN B1) tablet 100 mg (has no administration in time range)  cefTRIAXone (ROCEPHIN) 2 g in sodium chloride 0.9 % 100 mL IVPB (has no administration in time range)  rifaximin (XIFAXAN) tablet 550 mg (has no administration in time range)  atorvastatin (LIPITOR) tablet 40 mg (has no administration in time range)  spironolactone (ALDACTONE) tablet 50 mg (has no administration in time range)  venlafaxine XR (EFFEXOR-XR) 24 hr capsule 75 mg (has no administration in time range)  levothyroxine (SYNTHROID) tablet 150 mcg (has no administration in time range)  dicyclomine (BENTYL) capsule 10 mg (has no administration in time range)  famotidine (PEPCID) tablet 20 mg (has no administration in time range)  lactulose (CHRONULAC) 10 GM/15ML solution 20 g (has no administration in time range)  pantoprazole (PROTONIX) EC tablet 40 mg (has no administration in time range)  tamsulosin (FLOMAX) capsule 0.4 mg (has no administration in time range)  cyanocobalamin (VITAMIN B12) tablet 2,000 mcg (has no administration in time range)  Iron-Vitamin C  100-250 MG TABS (has no administration in time range)  rOPINIRole (REQUIP) tablet 0.25 mg (has no administration in time range)  morphine (PF) 4 MG/ML injection 4 mg (4 mg Intravenous Given 01/02/23 2323)  lactated ringers bolus 1,000 mL (0 mLs Intravenous Stopped 01/03/23 0049)  cefTRIAXone (ROCEPHIN) 2 g in sodium chloride 0.9 % 100 mL IVPB (0 g Intravenous Stopped 01/03/23 0119)  lactulose (CHRONULAC) 10 GM/15ML solution 30 g (30 g Oral Given 01/03/23 0214)  morphine (PF) 4 MG/ML injection 4 mg (4 mg Intravenous Given 01/03/23 0316)  lactated ringers bolus 1,000 mL (0 mLs Intravenous Stopped 01/03/23 0630)    Mobility non-ambulatory

## 2023-01-03 NOTE — ED Notes (Signed)
Pt has a lactic critical of 2.66. Physician was made aware.

## 2023-01-03 NOTE — ED Notes (Signed)
Patient attempting to use bedpan at this time for urine sample to be obtained. If not patient has a order for in and out cath.

## 2023-01-04 DIAGNOSIS — K7682 Hepatic encephalopathy: Secondary | ICD-10-CM | POA: Diagnosis not present

## 2023-01-04 LAB — BLOOD CULTURE ID PANEL (REFLEXED) - BCID2
A.calcoaceticus-baumannii: NOT DETECTED
Bacteroides fragilis: NOT DETECTED
Candida albicans: NOT DETECTED
Candida auris: NOT DETECTED
Candida glabrata: NOT DETECTED
Candida krusei: NOT DETECTED
Candida parapsilosis: NOT DETECTED
Candida tropicalis: NOT DETECTED
Cryptococcus neoformans/gattii: NOT DETECTED
Enterobacter cloacae complex: NOT DETECTED
Enterobacterales: NOT DETECTED — AB
Enterococcus Faecium: NOT DETECTED
Enterococcus faecalis: NOT DETECTED
Escherichia coli: NOT DETECTED
Haemophilus influenzae: NOT DETECTED
Klebsiella aerogenes: NOT DETECTED
Klebsiella oxytoca: NOT DETECTED
Klebsiella pneumoniae: NOT DETECTED — AB
Listeria monocytogenes: NOT DETECTED
Neisseria meningitidis: NOT DETECTED
Proteus species: NOT DETECTED
Pseudomonas aeruginosa: NOT DETECTED
Salmonella species: NOT DETECTED
Serratia marcescens: NOT DETECTED
Staphylococcus aureus (BCID): NOT DETECTED
Staphylococcus epidermidis: NOT DETECTED
Staphylococcus lugdunensis: NOT DETECTED
Staphylococcus species: NOT DETECTED
Stenotrophomonas maltophilia: NOT DETECTED
Streptococcus agalactiae: NOT DETECTED
Streptococcus pneumoniae: NOT DETECTED
Streptococcus pyogenes: NOT DETECTED
Streptococcus species: NOT DETECTED

## 2023-01-04 LAB — PROTIME-INR
INR: 2.3 — ABNORMAL HIGH (ref 0.8–1.2)
Prothrombin Time: 25.4 s — ABNORMAL HIGH (ref 11.4–15.2)

## 2023-01-04 NOTE — Evaluation (Signed)
Physical Therapy Evaluation Patient Details Name: [name] MRN: [id number] DOB: Nov 10, 1955 Today's Date: 01/04/2023  History of Present Illness  67 y.o. female was brought into the emergency department due to concern for mild worsening confusion in addition to leg pain/swelling. Dx of hepatic encephalopathy, RLE cellulitis. 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  Clinical Impression  Pt admitted with above diagnosis. Pt ambulated 63' with RW, no loss of balance, SpO2 98% on room air, HR 107 with ambulation. At baseline she ambulates with a RW at home and has needed level of assistance from her son. Pt is very pleasant and puts forth good effort.  Pt currently with functional limitations due to the deficits listed below (see PT Problem List). Pt will benefit from acute skilled PT to increase their independence and safety with mobility to allow discharge.           If plan is discharge home, recommend the following: A little help with walking and/or transfers;A little help with bathing/dressing/bathroom;Assistance with cooking/housework;Assist for transportation;Help with stairs or ramp for entrance   Can travel by private vehicle        Equipment Recommendations None recommended by PT  Recommendations for Other Services       Functional Status Assessment Patient has had a recent decline in their functional status and demonstrates the ability to make significant improvements in function in a reasonable and predictable amount of time.     Precautions / Restrictions Precautions Precautions: Fall Precaution Comments: denies falls in past 6 months Restrictions Weight Bearing Restrictions Per  Provider Order: No      Mobility  Bed Mobility Overal bed mobility: Needs Assistance       Supine to sit: Supervision Sit to supine: Supervision        Transfers Overall transfer level: Needs assistance Equipment used: Rolling walker (2 wheels) Transfers: Sit to/from Stand Sit to Stand: Contact guard assist           General transfer comment: use of momentum to power up from bed    Ambulation/Gait Ambulation/Gait assistance: Contact guard assist Gait Distance (Feet): 70 Feet Assistive device: Rolling walker (2 wheels) Gait Pattern/deviations: Step-to pattern, Decreased step length - right, Decreased step length - left, Shuffle, Trunk flexed Gait velocity: decreased     General Gait Details: steady, no loss of balance, SpO2 98% on room air, HR 107 walking  Stairs            Wheelchair Mobility     Tilt Bed    Modified Rankin (Stroke Patients Only)       Balance Overall balance assessment: Needs assistance   Sitting balance-Leahy Scale: Good     Standing balance support: Bilateral upper extremity supported Standing balance-Leahy Scale: Poor Standing balance comment: reliant on RW for dynamic balance                             Pertinent Vitals/Pain Pain Assessment Pain Score: 10-Worst pain ever Pain Location: back (2* prior compression fx) Pain Descriptors / Indicators: Sore Pain Intervention(s): Limited activity within patient's tolerance, Monitored during session, Premedicated before session, Repositioned    Home Living Family/patient expects to be discharged to:: Private residence Living Arrangements: Children Available Help at Discharge: Family;Available 24 hours/day  Type of Home: House Home Access: Ramped entrance       Home Layout: One level Home Equipment: Rollator (4 wheels);Rolling Walker (2 wheels);BSC/3in1;Other (comment);Hospital bed;Wheelchair - manual Additional Comments: Pt lives with [name] - very  supportive, provides assist as needed    Prior Function Prior Level of Function : Needs assist             Mobility Comments: son typically present for all mobility; uses RW in home, manual WC outside home ADLs Comments: Son has been assisting with ADLs lately. Pt states she was doing her own ADL tasks until recently     Extremity/Trunk Assessment   Upper Extremity Assessment Upper Extremity Assessment: Defer to OT evaluation    Lower Extremity Assessment Lower Extremity Assessment: Overall WFL for tasks assessed    Cervical / Trunk Assessment Cervical / Trunk Assessment: Normal  Communication   Communication Communication: No apparent difficulties  Cognition Arousal: Alert Behavior During Therapy: WFL for tasks assessed/performed Overall Cognitive Status: Within Functional Limits for tasks assessed                                          General Comments      Exercises     Assessment/Plan    PT Assessment Patient needs continued PT services  PT Problem List Decreased strength;Decreased activity tolerance;Decreased balance;Decreased mobility;Decreased knowledge of use of DME;Obesity;Pain       PT Treatment Interventions DME instruction;Gait training;Functional mobility training;Therapeutic activities;Therapeutic exercise;Balance training;Patient/family education    PT Goals (Current goals can be found in the Care Plan section)  Acute Rehab PT Goals Patient Stated Goal: go to the beach wtih son PT Goal Formulation: With patient Time For Goal Achievement: 01/18/23 Potential to Achieve Goals: Good    Frequency Min 1X/week     Co-evaluation               AM-PAC PT "6 Clicks" Mobility  Outcome Measure Help needed turning from your back to your side while in a flat bed without using bedrails?: None Help needed moving from lying on your back to sitting on the side of a flat bed without using bedrails?: None Help needed moving to and  from a bed to a chair (including a wheelchair)?: None Help needed standing up from a chair using your arms (e.g., wheelchair or bedside chair)?: None Help needed to walk in hospital room?: A Little Help needed climbing 3-5 steps with a railing? : A Lot 6 Click Score: 21    End of Session Equipment Utilized During Treatment: Gait belt Activity Tolerance: Patient tolerated treatment well Patient left: in bed;with call bell/phone within reach Nurse Communication: Mobility status PT Visit Diagnosis: Muscle weakness (generalized) (M62.81);Difficulty in walking, not elsewhere classified (R26.2);Pain    Time: 0981-1914 PT Time Calculation (min) (ACUTE ONLY): 14 min   Charges:   PT Evaluation $PT Eval Moderate Complexity: 1 Mod   PT General Charges $$ ACUTE PT VISIT: 1 Visit         Tamala Ser PT 01/04/2023  Acute Rehabilitation Services  Office [phone]

## 2023-01-04 NOTE — Plan of Care (Signed)
  Problem: Clinical Measurements: Goal: Diagnostic test results will improve Outcome: Progressing   Problem: Activity: Goal: Risk for activity intolerance will decrease Outcome: Progressing   Problem: Coping: Goal: Level of anxiety will decrease Outcome: Progressing   

## 2023-01-04 NOTE — Progress Notes (Addendum)
PROGRESS NOTE    [name]  ZOX:[id number] DOB: Jan 22, 1955 DOA: 01/02/2023 PCP: [name], MD    Brief Narrative:    [name] is a 67 y.o. female with hx of decompensated [name] cirrhosis with recurrent hepatic encephalopathy, hypotension on midodrine, PE/DVT off of anticoagulation, cerebral atherosclerosis, short gut syndrome, hypothyroidism, obesity, history MDRO infection, mood disorder, left eye blindness, recent admission with recurrent encephalopathy from 11/30-12/7 was s brought into the emergency department due to concern for mild worsening confusion in addition to leg pain/swelling.  As per the family patient was having 4-6 bowel movements and was taking lactulose with rifaximin but started having increasing confusion with right lower extremity pain and bilateral lower extremity swelling.  Patient then decided to come to the hospital for further evaluation and treatment.   Assessment and plan.  Decompensated nonalcoholic steatohepatitis cirrhosis Recurrent hepatic encephalopathy Ascites. Coagulopathy. History of recurrent encephalopathy.  Likely exacerbated by dehydration possible infection and use of narcotics.  MELD-Na is 23 on presentation.  Last EGD 10/24/ with no esophageal varices.  Continue spironolactone. Mental status likely at baseline at this time.  Continue rifaximin and lactulose.  No evidence of bleeding.  Possible RLE cellulitis Bilateral lower extremity edema  History of moderate aortic stenosis, diastolic heart failure right lower extremity with erythema and induration noted likely cellulitis..  Edema secondary to cirrhosis or possibly heart failure with preserved ejection fraction and aortic stenosis.  Continue Rocephin IV.  Continue intake and output charting Daily weights.  Still with redness swelling and tenderness.  Now with bacteremia.  Continue Rocephin.  Klebsiella pneumoniae bacteremia.  On IV Rocephin.  Will continue  Lactic acidosis   Improved with IV fluids.  Lactic acid of 2.0.   AKI stage I Improved.  Creatinine of 0.9.    Non anion gap acidosis Likely related to chronic diarrhea, with component from lactic acidosis.  Latest bicarb of 16.  Check BMP in AM.   Mild hyponatremia, asymptomatic Sodium of 132.  Check BMP in AM.   Recent URI symptoms Respiratory viral panel was negative.   Abdominal pain/tenderness History of ventral hernia KUB with large and small bowel dilation despite chronic diarrhea.  Has had chronic abdominal issues.  Grade 3 obesity.   Body mass index is 43.53 kg/m. Would benefit from weight loss as outpatient.  Deconditioning debility.  Will get PT evaluation.  Pending.    DVT prophylaxis: enoxaparin (LOVENOX) injection 40 mg Start: 01/03/23 1000   Code Status:     Code Status: Do not attempt resuscitation (DNR) PRE-ARREST INTERVENTIONS DESIRED  Disposition: Home with home health likely.  Will get PT evaluation.  Status is: Inpatient Remains inpatient appropriate because: Pending clinical improvement, bacteremia, IV antibiotic   Family Communication: None at bedside.  Spoke with the patient's son at bedside 01/03/2023  Consultants:  None  Procedures:  None  Antimicrobials:  Rocephin IV  Anti-infectives (From admission, onward)    Start     Dose/Rate Route Frequency Ordered Stop   01/03/23 2200  cefTRIAXone (ROCEPHIN) 2 g in sodium chloride 0.9 % 100 mL IVPB        2 g 200 mL/hr over 30 Minutes Intravenous Every 24 hours 01/03/23 0632     01/03/23 1000  rifaximin (XIFAXAN) tablet 550 mg        550 mg Oral 2 times daily 01/03/23 0653     01/02/23 2345  cefTRIAXone (ROCEPHIN) 2 g in sodium chloride 0.9 % 100 mL IVPB  2 g 200 mL/hr over 30 Minutes Intravenous  Once 01/02/23 2343 01/03/23 0119        Subjective: Today, patient was seen and examined at bedside.  Complains of right lower extremity redness and swelling.    Objective: Vitals:   01/03/23 1418  01/03/23 1820 01/03/23 2038 01/04/23 0533  BP: (!) 96/54 119/61 121/66 97/64  Pulse: 92 84 89 88  Resp: 19 19 20 18   Temp: 98.9 F (37.2 C) 98.4 F (36.9 C) 98.6 F (37 C) 98.4 F (36.9 C)  TempSrc: Oral Oral Oral Oral  SpO2: 94% 100% 100% 98%  Weight:    108 kg  Height:        Intake/Output Summary (Last 24 hours) at 01/04/2023 0944 Last data filed at 01/04/2023 0300 Gross per 24 hour  Intake 750.28 ml  Output --  Net 750.28 ml   Filed Weights   01/02/23 2153 01/04/23 0533  Weight: 127.9 kg 108 kg    Physical Examination: Body mass index is 43.53 kg/m.   General: Morbidly obese built, not in obvious distress HENT:   No scleral pallor or icterus noted. Oral mucosa is moist.  Left corneal opacity Chest:   Diminished breath sounds bilaterally. No crackles or wheezes.  CVS: S1 &S2 heard. No murmur.  Regular rate and rhythm. Abdomen: Soft, nontender, nondistended.  Bowel sounds are heard.   Extremities: No cyanosis, clubbing redness of the right thigh with some induration, bilateral lower extremity trace edema.Psych: Alert, awake and oriented, normal mood CNS:  No cranial nerve deficits.  Power equal in all extremities.   Skin: Warm and dry.  No rashes noted.  Data Reviewed:   CBC: Recent Labs  Lab 01/02/23 2257 01/03/23 0616  WBC 7.3 5.4  NEUTROABS 6.2  --   HGB 9.3* 7.8*  HCT 29.7* 24.8*  MCV 95.2 93.2  PLT 106* 80*    Basic Metabolic Panel: Recent Labs  Lab 01/02/23 2257 01/03/23 0616 01/03/23 0617  NA 132* 132*  --   K 4.7 4.6  --   CL 109 109  --   CO2 16* 16*  --   GLUCOSE 120* 82  --   BUN 20 20  --   CREATININE 1.16* 0.94  --   CALCIUM 8.2* 7.9*  --   MG  --   --  1.6*  PHOS  --   --  3.0    Liver Function Tests: Recent Labs  Lab 01/02/23 2257 01/03/23 0617  AST 33 25  ALT 20 16  ALKPHOS 81 62  BILITOT 2.3* 2.1*  PROT 5.7* 4.7*  ALBUMIN 2.9* 2.3*     Radiology Studies: ECHOCARDIOGRAM COMPLETE Result Date: 01/03/2023     ECHOCARDIOGRAM REPORT   Patient Name:   [name] Date of Exam: 01/03/2023 Medical Rec #:  [id number]      Height:       62.0 in Accession #:    [id number]     Weight:       282.0 lb Date of Birth:  13-Mar-1955       BSA:          2.212 m Patient Age:    67 years       BP:           102/53 mmHg Patient Gender: F              HR:           87 bpm. Exam Location:  Inpatient Procedure:  2D Echo, Color Doppler and Cardiac Doppler Indications:    Aortic stenosis  History:        Patient has prior history of Echocardiogram examinations, most                 recent 06/13/2022. Signs/Symptoms:Hypotension; Risk Factors:Former                 Smoker.  Sonographer:    Vern Claude Referring Phys: [id number] JONATHAN SEGARS IMPRESSIONS  1. Left ventricular ejection fraction, by estimation, is 60 to 65%. The left ventricle has normal function. The left ventricle has no regional wall motion abnormalities. There is mild asymmetric left ventricular hypertrophy of the septal segment. Left ventricular diastolic function could not be evaluated.  2. Right ventricular systolic function is normal. The right ventricular size is normal. There is mildly elevated pulmonary artery systolic pressure.  3. The mitral valve is degenerative. No evidence of mitral valve regurgitation. Mild mitral stenosis. The mean mitral valve gradient is 4.0 mmHg. Severe mitral annular calcification.  4. Normal stroke volume index, DVI 0.33; Peak velocity on highest gradient over 4 m/s. The aortic valve is tricuspid. There is mild calcification of the aortic valve. There is mild thickening of the aortic valve. Aortic valve regurgitation is not visualized. Severe aortic valve stenosis. Aortic valve mean gradient measures 36.0 mmHg. Aortic valve Vmax measures 4.09 m/s. Comparison(s): Prior images reviewed side by side. Gradients are higher on this assessment. FINDINGS  Left Ventricle: Left ventricular ejection fraction, by estimation, is 60 to 65%. The left ventricle  has normal function. The left ventricle has no regional wall motion abnormalities. The left ventricular internal cavity size was normal in size. There is  mild asymmetric left ventricular hypertrophy of the septal segment. Left ventricular diastolic function could not be evaluated due to mitral annular calcification (moderate or greater). Left ventricular diastolic function could not be evaluated. Right Ventricle: The right ventricular size is normal. No increase in right ventricular wall thickness. Right ventricular systolic function is normal. There is mildly elevated pulmonary artery systolic pressure. The tricuspid regurgitant velocity is 2.96  m/s, and with an assumed right atrial pressure of 3 mmHg, the estimated right ventricular systolic pressure is 38.0 mmHg. Left Atrium: Left atrial size was normal in size. Right Atrium: Right atrial size was normal in size. Pericardium: There is no evidence of pericardial effusion. Mitral Valve: The mitral valve is degenerative in appearance. Severe mitral annular calcification. No evidence of mitral valve regurgitation. Mild mitral valve stenosis. MV peak gradient, 7.3 mmHg. The mean mitral valve gradient is 4.0 mmHg. Tricuspid Valve: The tricuspid valve is grossly normal. Tricuspid valve regurgitation is mild. Aortic Valve: Normal stroke volume index, DVI 0.33; Peak velocity on highest gradient over 4 m/s. The aortic valve is tricuspid. There is mild calcification of the aortic valve. There is mild thickening of the aortic valve. Aortic valve regurgitation is not visualized. Severe aortic stenosis is present. Aortic valve mean gradient measures 36.0 mmHg. Aortic valve peak gradient measures 66.9 mmHg. Aortic valve area, by VTI measures 1.10 cm. Pulmonic Valve: The pulmonic valve was grossly normal. Pulmonic valve regurgitation is not visualized. No evidence of pulmonic stenosis. Aorta: The aortic root and ascending aorta are structurally normal, with no evidence of  dilitation. IAS/Shunts: The atrial septum is grossly normal.  LEFT VENTRICLE PLAX 2D LVIDd:         5.10 cm      Diastology LVIDs:  3.20 cm      LV e' medial:    8.27 cm/s LV PW:         1.00 cm      LV E/e' medial:  14.9 LV IVS:        1.30 cm      LV e' lateral:   9.36 cm/s LVOT diam:     1.90 cm      LV E/e' lateral: 13.1 LV SV:         78 LV SV Index:   35 LVOT Area:     2.84 cm  LV Volumes (MOD) LV vol d, MOD A2C: 203.0 ml LV vol d, MOD A4C: 227.0 ml LV vol s, MOD A2C: 67.2 ml LV vol s, MOD A4C: 84.4 ml LV SV MOD A2C:     135.8 ml LV SV MOD A4C:     227.0 ml LV SV MOD BP:      139.2 ml RIGHT VENTRICLE             IVC RV Basal diam:  3.90 cm     IVC diam: 2.20 cm RV Mid diam:    2.50 cm RV S prime:     13.80 cm/s TAPSE (M-mode): 2.2 cm LEFT ATRIUM           Index        RIGHT ATRIUM           Index LA diam:      4.40 cm 1.99 cm/m   RA Area:     14.30 cm LA Vol (A2C): 73.2 ml 33.09 ml/m  RA Volume:   30.30 ml  13.70 ml/m LA Vol (A4C): 26.8 ml 12.11 ml/m  AORTIC VALVE                     PULMONIC VALVE AV Area (Vmax):    0.97 cm      PV Vmax:          1.99 m/s AV Area (Vmean):   1.21 cm      PV Peak grad:     15.8 mmHg AV Area (VTI):     1.10 cm      PR End Diast Vel: 4.33 msec AV Vmax:           409.00 cm/s AV Vmean:          224.600 cm/s AV VTI:            0.708 m AV Peak Grad:      66.9 mmHg AV Mean Grad:      36.0 mmHg LVOT Vmax:         140.00 cm/s LVOT Vmean:        95.500 cm/s LVOT VTI:          0.275 m LVOT/AV VTI ratio: 0.39  AORTA Ao Root diam: 3.00 cm Ao Asc diam:  3.20 cm MITRAL VALVE                TRICUSPID VALVE MV Area (PHT): 4.29 cm     TR Peak grad:   35.0 mmHg MV Area VTI:   2.42 cm     TR Vmax:        296.00 cm/s MV Peak grad:  7.3 mmHg MV Mean grad:  4.0 mmHg     SHUNTS MV Vmax:       1.36 m/s     Systemic VTI:  0.28 m MV Vmean:  91.8 cm/s    Systemic Diam: 1.90 cm MV Decel Time: 177 msec MV E velocity: 123.00 cm/s MV A velocity: 103.70 cm/s MV E/A ratio:  1.19 Riley Lam MD Electronically signed by Riley Lam MD Signature Date/Time: 01/03/2023/6:56:06 PM    Final    DG Abd 1 View Result Date: 01/03/2023 CLINICAL DATA:  67 year old female with confusion, ileus. EXAM: ABDOMEN - 1 VIEW COMPARISON:  CT Abdomen and Pelvis 10/01/2022 and earlier. FINDINGS: Stable cholecystectomy clips. Lung bases appear stable, negative. Augmented T12 compression fracture since September. Bulky calcified uterine fibroids redemonstrated in the pelvis. Gas-filled bowel loops in the abdomen and pelvis do appear to be both large and small bowel, while the stomach seems decompressed. Osteopenia. Stable abdominal and pelvic visceral contours. No definite pneumoperitoneum on these two views which may be supine. IMPRESSION: 1. Gas-filled small and large bowel bowel loops is compatible with ileus. Distal large bowel obstruction felt less likely. Follow-up abdominal radiographs may be valuable. 2. Bulky calcified uterine fibroids. Osteopenia. Augmented T12 compression fracture. Electronically Signed   By: Odessa Fleming M.D.   On: 01/03/2023 05:48   DG Chest Portable 1 View Result Date: 01/03/2023 CLINICAL DATA:  Confusion, tachycardia, tachypnea EXAM: PORTABLE CHEST 1 VIEW COMPARISON:  09/30/2022 FINDINGS: Cardiomegaly. Low lung volumes. Mild diffuse interstitial prominence throughout the lungs. No effusions. No acute bony abnormality. Aortic atherosclerosis. IMPRESSION: 1. Cardiomegaly. 2. Low lung volumes. Diffuse interstitial prominence could reflect edema or atypical infection. Electronically Signed   By: Charlett Nose M.D.   On: 01/03/2023 00:26      LOS: 1 day     Joycelyn Das, MD [facility] Available via Epic secure chat 7am-7pm After these hours, please refer to coverage provider listed on [email or web address] 01/04/2023, 9:44 AM

## 2023-01-04 NOTE — Plan of Care (Signed)

## 2023-01-05 DIAGNOSIS — K7682 Hepatic encephalopathy: Secondary | ICD-10-CM | POA: Diagnosis not present

## 2023-01-05 LAB — CBC
HCT: 24.6 % — ABNORMAL LOW (ref 36.0–46.0)
Hemoglobin: 7.7 g/dL — ABNORMAL LOW (ref 12.0–15.0)
MCH: 29.2 pg (ref 26.0–34.0)
MCHC: 31.3 g/dL (ref 30.0–36.0)
MCV: 93.2 fL (ref 80.0–100.0)
Platelets: 105 10*3/uL — ABNORMAL LOW (ref 150–400)
RBC: 2.64 MIL/uL — ABNORMAL LOW (ref 3.87–5.11)
RDW: 16.5 % — ABNORMAL HIGH (ref 11.5–15.5)
WBC: 4.7 10*3/uL (ref 4.0–10.5)
nRBC: 0 % (ref 0.0–0.2)

## 2023-01-05 LAB — COMPREHENSIVE METABOLIC PANEL
ALT: 17 U/L (ref 0–44)
AST: 22 U/L (ref 15–41)
Albumin: 2.4 g/dL — ABNORMAL LOW (ref 3.5–5.0)
Alkaline Phosphatase: 82 U/L (ref 38–126)
Anion gap: 5 (ref 5–15)
BUN: 22 mg/dL (ref 8–23)
CO2: 18 mmol/L — ABNORMAL LOW (ref 22–32)
Calcium: 7.7 mg/dL — ABNORMAL LOW (ref 8.9–10.3)
Chloride: 109 mmol/L (ref 98–111)
Creatinine, Ser: 1.27 mg/dL — ABNORMAL HIGH (ref 0.44–1.00)
GFR, Estimated: 47 mL/min — ABNORMAL LOW (ref >60)
Glucose, Bld: 106 mg/dL — ABNORMAL HIGH (ref 70–99)
Potassium: 4.2 mmol/L (ref 3.5–5.1)
Sodium: 132 mmol/L — ABNORMAL LOW (ref 135–145)
Total Bilirubin: 1.2 mg/dL — ABNORMAL HIGH (ref <1.2)
Total Protein: 5.1 g/dL — ABNORMAL LOW (ref 6.5–8.1)

## 2023-01-05 LAB — MAGNESIUM: Magnesium: 1.9 mg/dL (ref 1.7–2.4)

## 2023-01-05 LAB — AMMONIA: Ammonia: 57 umol/L — ABNORMAL HIGH (ref 9–35)

## 2023-01-05 NOTE — Plan of Care (Signed)
  Problem: Activity: Goal: Risk for activity intolerance will decrease Outcome: Progressing   Problem: Coping: Goal: Level of anxiety will decrease Outcome: Progressing   Problem: Skin Integrity: Goal: Risk for impaired skin integrity will decrease Outcome: Progressing   

## 2023-01-05 NOTE — Progress Notes (Signed)
PROGRESS NOTE    [name]  HYQ:[id number] DOB: Feb 18, 1955 DOA: 01/02/2023 PCP: [name], MD    Brief Narrative:    [name] is a 67 y.o. 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  As per the family patient was having 4-6 bowel movements and was taking lactulose with rifaximin but started having increasing confusion with right lower extremity pain and bilateral lower extremity swelling.  Patient then decided to come to the hospital for further evaluation and treatment.   Assessment and plan.  Decompensated nonalcoholic steatohepatitis cirrhosis Recurrent hepatic encephalopathy Ascites. Coagulopathy. History of recurrent encephalopathy.  Likely exacerbated by dehydration possible infection and use of narcotics.  MELD-Na is 23 on presentation.  Last EGD 10/24/ with no esophageal varices.  Continue spironolactone. Mental status at baseline at this time.  Continue rifaximin and lactulose.  No evidence of bleeding.  Ammonia 57 today.  Possible RLE cellulitis Bilateral lower extremity edema  History of moderate aortic stenosis, diastolic heart failure right lower extremity with erythema and induration noted likely cellulitis..  Edema secondary to cirrhosis or possibly heart failure with preserved ejection fraction and aortic stenosis.  Continue Rocephin IV.  Continue intake and output charting Daily weights.  Cellulitis responding with IV Rocephin.  Has bacteremia  Klebsiella pneumoniae bacteremia.  On IV Rocephin.  Will continue to follow sensitivity.  He used to have  resistant bacteria in the past.  Lactic acidosis  Improved with IV fluids.  Lactic acid of 2.0.   AKI stage I Improved.  Creatinine of 1.2 today.  Non anion gap acidosis Likely related to chronic diarrhea, with component from lactic acidosis.  Latest bicarb of 18.  Check BMP in AM.   Mild hyponatremia, asymptomatic Sodium of 132.  Check BMP in AM.   Recent URI symptoms Respiratory viral panel was negative.   Abdominal pain/tenderness History of ventral hernia KUB with large and small bowel dilation despite chronic diarrhea.  Has had chronic abdominal issues.  Grade 3 obesity.   Body mass index is 43.53 kg/m. Would benefit from weight loss as outpatient.  Deconditioning debility.  Plan for PT mobility today.    DVT prophylaxis: enoxaparin (LOVENOX) injection 40 mg Start: 01/03/23 1000   Code Status:     Code Status: Do not attempt resuscitation (DNR) PRE-ARREST INTERVENTIONS DESIRED  Disposition: Home with home health likely 01/06/2023..  Will get PT evaluation.  Status is: Inpatient Remains inpatient appropriate because: Pending clinical improvement, bacteremia, IV antibiotic, PT evaluation   Family Communication:   Spoke with the patient's son at bedside 01/05/2023  Consultants:  None  Procedures:  None  Antimicrobials:  Rocephin IV  Anti-infectives (From admission, onward)    Start     Dose/Rate Route Frequency Ordered Stop   01/03/23 2200  cefTRIAXone (ROCEPHIN) 2 g in sodium chloride 0.9 % 100 mL IVPB        2 g 200 mL/hr over 30 Minutes Intravenous Every 24 hours 01/03/23 0632     01/03/23 1000  rifaximin (XIFAXAN) tablet 550 mg        550 mg Oral 2 times daily 01/03/23 0653     01/02/23 2345  cefTRIAXone (ROCEPHIN) 2 g in sodium chloride  0.9 % 100 mL IVPB        2 g 200 mL/hr over 30 Minutes Intravenous  Once 01/02/23 2343 01/03/23 0119       Subjective: Today, patient was seen and examined at bedside.  Complains of mild redness and swelling of  the right lower extremities but improved.  Wishes to ambulate with PT.  Objective: Vitals:   01/04/23 1510 01/04/23 2036 01/04/23 2300 01/05/23 0414  BP: (!) 94/51 (!) 92/51 104/73 (!) 109/56  Pulse: 87 78  72  Resp: 18 16  17   Temp: 97.8 F (36.6 C) 98.7 F (37.1 C)  97.8 F (36.6 C)  TempSrc: Oral Oral  Oral  SpO2: 99% 100%  100%  Weight:      Height:        Intake/Output Summary (Last 24 hours) at 01/05/2023 0955 Last data filed at 01/05/2023 0944 Gross per 24 hour  Intake 117 ml  Output 200 ml  Net -83 ml   Filed Weights   01/02/23 2153 01/04/23 0533  Weight: 127.9 kg 108 kg    Physical Examination: Body mass index is 43.53 kg/m.   General: Morbidly obese built, not in obvious distress HENT:   No scleral pallor or icterus noted. Oral mucosa is moist.  Left corneal opacity Chest:   Diminished breath sounds bilaterally. No crackles or wheezes.  CVS: S1 &S2 heard. No murmur.  Regular rate and rhythm. Abdomen: Soft, nontender, nondistended.  Bowel sounds are heard.   Extremities: No cyanosis, clubbing redness of the right thigh with some induration has improved., bilateral lower extremity trace edema. Psych: Alert, awake and oriented, normal mood CNS:  No cranial nerve deficits.  Power equal in all extremities.   Skin: Warm and dry.  No rashes noted.  Data Reviewed:   CBC: Recent Labs  Lab 01/02/23 2257 01/03/23 0616 01/05/23 0531  WBC 7.3 5.4 4.7  NEUTROABS 6.2  --   --   HGB 9.3* 7.8* 7.7*  HCT 29.7* 24.8* 24.6*  MCV 95.2 93.2 93.2  PLT 106* 80* 105*    Basic Metabolic Panel: Recent Labs  Lab 01/02/23 2257 01/03/23 0616 01/03/23 0617 01/05/23 0542  NA 132* 132*  --  132*  K 4.7 4.6  --  4.2  CL 109 109  --  109  CO2 16* 16*  --  18*  GLUCOSE 120* 82  --  106*  BUN 20 20  --  22  CREATININE 1.16* 0.94  --  1.27*  CALCIUM 8.2* 7.9*  --  7.7*  MG  --   --  1.6* 1.9  PHOS  --   --  3.0  --     Liver Function Tests: Recent Labs  Lab  01/02/23 2257 01/03/23 0617 01/05/23 0542  AST 33 25 22  ALT 20 16 17   ALKPHOS 81 62 82  BILITOT 2.3* 2.1* 1.2*  PROT 5.7* 4.7* 5.1*  ALBUMIN 2.9* 2.3* 2.4*     Radiology Studies: ECHOCARDIOGRAM COMPLETE Result Date: 01/03/2023    ECHOCARDIOGRAM REPORT   Patient Name:   GRAYLYN HIEBER Date of Exam: 01/03/2023 Medical Rec #:  [id number]      Height:       62.0 in Accession #:    [id number]     Weight:       282.0 lb Date of Birth:  Sep 12, 1955       BSA:          2.212 m Patient Age:    65  years       BP:           102/53 mmHg Patient Gender: F              HR:           87 bpm. Exam Location:  Inpatient Procedure: 2D Echo, Color Doppler and Cardiac Doppler Indications:    Aortic stenosis  History:        Patient has prior history of Echocardiogram examinations, most                 recent 06/13/2022. Signs/Symptoms:Hypotension; Risk Factors:Former                 Smoker.  Sonographer:    Vern Claude Referring Phys: [id number] JONATHAN SEGARS IMPRESSIONS  1. Left ventricular ejection fraction, by estimation, is 60 to 65%. The left ventricle has normal function. The left ventricle has no regional wall motion abnormalities. There is mild asymmetric left ventricular hypertrophy of the septal segment. Left ventricular diastolic function could not be evaluated.  2. Right ventricular systolic function is normal. The right ventricular size is normal. There is mildly elevated pulmonary artery systolic pressure.  3. The mitral valve is degenerative. No evidence of mitral valve regurgitation. Mild mitral stenosis. The mean mitral valve gradient is 4.0 mmHg. Severe mitral annular calcification.  4. Normal stroke volume index, DVI 0.33; Peak velocity on highest gradient over 4 m/s. The aortic valve is tricuspid. There is mild calcification of the aortic valve. There is mild thickening of the aortic valve. Aortic valve regurgitation is not visualized. Severe aortic valve stenosis. Aortic valve mean gradient measures 36.0  mmHg. Aortic valve Vmax measures 4.09 m/s. Comparison(s): Prior images reviewed side by side. Gradients are higher on this assessment. FINDINGS  Left Ventricle: Left ventricular ejection fraction, by estimation, is 60 to 65%. The left ventricle has normal function. The left ventricle has no regional wall motion abnormalities. The left ventricular internal cavity size was normal in size. There is  mild asymmetric left ventricular hypertrophy of the septal segment. Left ventricular diastolic function could not be evaluated due to mitral annular calcification (moderate or greater). Left ventricular diastolic function could not be evaluated. Right Ventricle: The right ventricular size is normal. No increase in right ventricular wall thickness. Right ventricular systolic function is normal. There is mildly elevated pulmonary artery systolic pressure. The tricuspid regurgitant velocity is 2.96  m/s, and with an assumed right atrial pressure of 3 mmHg, the estimated right ventricular systolic pressure is 38.0 mmHg. Left Atrium: Left atrial size was normal in size. Right Atrium: Right atrial size was normal in size. Pericardium: There is no evidence of pericardial effusion. Mitral Valve: The mitral valve is degenerative in appearance. Severe mitral annular calcification. No evidence of mitral valve regurgitation. Mild mitral valve stenosis. MV peak gradient, 7.3 mmHg. The mean mitral valve gradient is 4.0 mmHg. Tricuspid Valve: The tricuspid valve is grossly normal. Tricuspid valve regurgitation is mild. Aortic Valve: Normal stroke volume index, DVI 0.33; Peak velocity on highest gradient over 4 m/s. The aortic valve is tricuspid. There is mild calcification of the aortic valve. There is mild thickening of the aortic valve. Aortic valve regurgitation is not visualized. Severe aortic stenosis is present. Aortic valve mean gradient measures 36.0 mmHg. Aortic valve peak gradient measures 66.9 mmHg. Aortic valve area, by VTI  measures 1.10 cm. Pulmonic Valve: The pulmonic valve was grossly normal. Pulmonic valve regurgitation is not visualized. No evidence of pulmonic  stenosis. Aorta: The aortic root and ascending aorta are structurally normal, with no evidence of dilitation. IAS/Shunts: The atrial septum is grossly normal.  LEFT VENTRICLE PLAX 2D LVIDd:         5.10 cm      Diastology LVIDs:         3.20 cm      LV e' medial:    8.27 cm/s LV PW:         1.00 cm      LV E/e' medial:  14.9 LV IVS:        1.30 cm      LV e' lateral:   9.36 cm/s LVOT diam:     1.90 cm      LV E/e' lateral: 13.1 LV SV:         78 LV SV Index:   35 LVOT Area:     2.84 cm  LV Volumes (MOD) LV vol d, MOD A2C: 203.0 ml LV vol d, MOD A4C: 227.0 ml LV vol s, MOD A2C: 67.2 ml LV vol s, MOD A4C: 84.4 ml LV SV MOD A2C:     135.8 ml LV SV MOD A4C:     227.0 ml LV SV MOD BP:      139.2 ml RIGHT VENTRICLE             IVC RV Basal diam:  3.90 cm     IVC diam: 2.20 cm RV Mid diam:    2.50 cm RV S prime:     13.80 cm/s TAPSE (M-mode): 2.2 cm LEFT ATRIUM           Index        RIGHT ATRIUM           Index LA diam:      4.40 cm 1.99 cm/m   RA Area:     14.30 cm LA Vol (A2C): 73.2 ml 33.09 ml/m  RA Volume:   30.30 ml  13.70 ml/m LA Vol (A4C): 26.8 ml 12.11 ml/m  AORTIC VALVE                     PULMONIC VALVE AV Area (Vmax):    0.97 cm      PV Vmax:          1.99 m/s AV Area (Vmean):   1.21 cm      PV Peak grad:     15.8 mmHg AV Area (VTI):     1.10 cm      PR End Diast Vel: 4.33 msec AV Vmax:           409.00 cm/s AV Vmean:          224.600 cm/s AV VTI:            0.708 m AV Peak Grad:      66.9 mmHg AV Mean Grad:      36.0 mmHg LVOT Vmax:         140.00 cm/s LVOT Vmean:        95.500 cm/s LVOT VTI:          0.275 m LVOT/AV VTI ratio: 0.39  AORTA Ao Root diam: 3.00 cm Ao Asc diam:  3.20 cm MITRAL VALVE                TRICUSPID VALVE MV Area (PHT): 4.29 cm     TR Peak grad:   35.0 mmHg MV Area VTI:   2.42 cm  TR Vmax:        296.00 cm/s MV Peak grad:  7.3 mmHg  MV Mean grad:  4.0 mmHg     SHUNTS MV Vmax:       1.36 m/s     Systemic VTI:  0.28 m MV Vmean:      91.8 cm/s    Systemic Diam: 1.90 cm MV Decel Time: 177 msec MV E velocity: 123.00 cm/s MV A velocity: 103.70 cm/s MV E/A ratio:  1.19 Riley Lam MD Electronically signed by Riley Lam MD Signature Date/Time: 01/03/2023/6:56:06 PM    Final       LOS: 2 days     Joycelyn Das, MD [facility] Available via Epic secure chat 7am-7pm After these hours, please refer to coverage provider listed on [email or web address] 01/05/2023, 9:55 AM

## 2023-01-05 NOTE — Progress Notes (Signed)
Mobility Specialist - Progress Note   01/05/23 0945  Mobility  Activity Ambulated with assistance in hallway  Level of Assistance Contact guard assist, steadying assist (chair follow)  Assistive Device Front wheel walker  Distance Ambulated (ft) 80 ft  Range of Motion/Exercises Active  Activity Response Tolerated well  Mobility Referral Yes  Mobility visit 1 Mobility  Mobility Specialist Start Time (ACUTE ONLY) [id number]  Mobility Specialist Stop Time (ACUTE ONLY) 0939  Mobility Specialist Time Calculation (min) (ACUTE ONLY) 16 min   Received in chair and agreed to mobility. Contact throughout the entire session, pt requested son to chair follow. Pt began to bleed onto floor, unsure from where. Opted to sit in recliner and ride back to room where pt was left with all needs met and staff and family in room.  Marilynne Halsted Mobility Specialist

## 2023-01-05 NOTE — Plan of Care (Signed)

## 2023-01-06 LAB — CBC
HCT: 28.2 % — ABNORMAL LOW (ref 36.0–46.0)
Hemoglobin: 8.5 g/dL — ABNORMAL LOW (ref 12.0–15.0)
MCH: 28.6 pg (ref 26.0–34.0)
MCHC: 30.1 g/dL (ref 30.0–36.0)
MCV: 94.9 fL (ref 80.0–100.0)
Platelets: 135 10*3/uL — ABNORMAL LOW (ref 150–400)
RBC: 2.97 MIL/uL — ABNORMAL LOW (ref 3.87–5.11)
RDW: 16.8 % — ABNORMAL HIGH (ref 11.5–15.5)
WBC: 5.9 10*3/uL (ref 4.0–10.5)
nRBC: 0 % (ref 0.0–0.2)

## 2023-01-06 LAB — BASIC METABOLIC PANEL
Anion gap: 5 (ref 5–15)
BUN: 18 mg/dL (ref 8–23)
CO2: 19 mmol/L — ABNORMAL LOW (ref 22–32)
Calcium: 8.2 mg/dL — ABNORMAL LOW (ref 8.9–10.3)
Chloride: 113 mmol/L — ABNORMAL HIGH (ref 98–111)
Creatinine, Ser: 0.98 mg/dL (ref 0.44–1.00)
GFR, Estimated: >60 mL/min (ref >60)
Glucose, Bld: 106 mg/dL — ABNORMAL HIGH (ref 70–99)
Potassium: 4.1 mmol/L (ref 3.5–5.1)
Sodium: 137 mmol/L (ref 135–145)

## 2023-01-06 LAB — CULTURE, BLOOD (ROUTINE X 2)

## 2023-01-06 MED ORDER — CIPROFLOXACIN HCL 500 MG PO TABS: 500.0000 mg | ORAL_TABLET | Freq: Two times a day (BID) | ORAL | 0 refills | Status: DC

## 2023-01-06 MED ORDER — PROCHLORPERAZINE EDISYLATE 10 MG/2ML IJ SOLN
5.0000 mg | Freq: Four times a day (QID) | INTRAMUSCULAR | Status: AC | PRN
  Administered 2023-01-07 – 2023-01-12 (×2): 5 mg via INTRAVENOUS
  Filled 2023-01-06 (×2): qty 2

## 2023-01-06 MED ORDER — SODIUM CHLORIDE 0.9 % IV SOLN
2.0000 g | INTRAVENOUS | Status: AC
  Administered 2023-01-06: 2 g via INTRAVENOUS
  Filled 2023-01-06: qty 20

## 2023-01-06 NOTE — Progress Notes (Signed)
Physical Therapy Treatment Patient Details Name: [name] MRN: [id number] DOB: July 12, 1955 Today's Date: 01/06/2023   History of Present Illness 67 y.o. female was brought into the emergency department due to concern for mild worsening confusion in addition to leg pain/swelling on 01/06/23. Dx of hepatic encephalopathy, RLE cellulitis. Pt  with hx of decompensated [name] cirrhosis with recurrent hepatic encephalopathy, hypotension on midodrine, PE/DVT off of anticoagulation, cerebral atherosclerosis, short gut syndrome, hypothyroidism, obesity, history MDRO infection, mood disorder, left eye blindness, recent admission with recurrent encephalopathy from 11/30-12/7.    PT Comments  Pt making gradual progress.  She reports nausea limited but still wanted to walk.  Required CGA for safety and ambulated 80'.  Pt has home support and DME.  Cont POC with recommendation for HHPT.     If plan is discharge home, recommend the following: A little help with walking and/or transfers;A little help with bathing/dressing/bathroom;Assistance with cooking/housework;Assist for transportation;Help with stairs or ramp for entrance   Can travel by private vehicle        Equipment Recommendations  None recommended by PT    Recommendations for Other Services       Precautions / Restrictions Precautions Precautions: Fall     Mobility  Bed Mobility Overal bed mobility: Needs Assistance Bed Mobility: Supine to Sit, Sit to Supine     Supine to sit: Supervision Sit to supine: Min assist   General bed mobility comments: MIn A legs back to bed.  Pt able to pull self up in bed in tredlenberg position    Transfers Overall transfer level: Needs assistance Equipment used: Rolling walker (2 wheels) Transfers: Sit to/from Stand Sit to Stand: Contact guard assist                Ambulation/Gait Ambulation/Gait assistance: Contact guard assist Gait Distance (Feet): 80 Feet Assistive device: Rolling  walker (2 wheels) Gait Pattern/deviations: Step-to pattern Gait velocity: decreased     General Gait Details: Steady, no LOB, did report fatigue easily today, min cues for posture   Stairs             Wheelchair Mobility     Tilt Bed    Modified Rankin (Stroke Patients Only)       Balance Overall balance assessment: Needs assistance Sitting-balance support: No upper extremity supported, Feet supported Sitting balance-Leahy Scale: Good     Standing balance support: Bilateral upper extremity supported, Reliant on assistive device for balance Standing balance-Leahy Scale: Poor Standing balance comment: steady with RW                            Cognition Arousal: Alert Behavior During Therapy: WFL for tasks assessed/performed Overall Cognitive Status: Within Functional Limits for tasks assessed                                          Exercises      General Comments General comments (skin integrity, edema, etc.): VSS; c/o nausea today that limited but still wanted to walk      Pertinent Vitals/Pain Pain Assessment Pain Assessment: No/denies pain    Home Living                          Prior Function            PT Goals (  current goals can now be found in the care plan section) Progress towards PT goals: Progressing toward goals    Frequency    Min 1X/week      PT Plan      Co-evaluation              AM-PAC PT "6 Clicks" Mobility   Outcome Measure  Help needed turning from your back to your side while in a flat bed without using bedrails?: None Help needed moving from lying on your back to sitting on the side of a flat bed without using bedrails?: A Little Help needed moving to and from a bed to a chair (including a wheelchair)?: A Little Help needed standing up from a chair using your arms (e.g., wheelchair or bedside chair)?: A Little Help needed to walk in hospital room?: A Little Help needed  climbing 3-5 steps with a railing? : A Lot 6 Click Score: 18    End of Session Equipment Utilized During Treatment: Gait belt Activity Tolerance: Other (comment) (Reports nausea that limited but still wanted to walk) Patient left: in bed;with call bell/phone within reach;with bed alarm set Nurse Communication: Mobility status PT Visit Diagnosis: Muscle weakness (generalized) (M62.81);Difficulty in walking, not elsewhere classified (R26.2);Pain     Time: 1610-9604 PT Time Calculation (min) (ACUTE ONLY): 18 min  Charges:    $Gait Training: 8-22 mins PT General Charges $$ ACUTE PT VISIT: 1 Visit                     Anise Salvo, PT Acute Rehab Services [facility] [phone]    Rayetta Humphrey 01/06/2023, 1:52 PM

## 2023-01-06 NOTE — Plan of Care (Signed)

## 2023-01-06 NOTE — Discharge Summary (Signed)
Physician Discharge Summary  [name] ZOX:[id number] DOB: 04-Jan-1956 DOA: 01/02/2023  PCP: Kara Hoit, MD  Admit date: 01/02/2023 Discharge date: 01/06/2023  Admitted From: Home  Discharge disposition: Home    Recommendations for Outpatient Follow-Up:   Follow up with your primary care provider in one week.  Check CBC, BMP, magnesium in the next visit  Discharge Diagnosis:   Principal Problem:   Hepatic encephalopathy (HCC) Cellulitis of the right leg.   Discharge Condition: Improved.  Diet recommendation: Low sodium, heart healthy.    Wound care: None.  Code status: Full.   History of Present Illness:    [name] is a 67 y.o. female with hx of decompensated [name] cirrhosis with recurrent hepatic encephalopathy, hypotension on midodrine, PE/DVT off of anticoagulation, cerebral atherosclerosis, short gut syndrome, hypothyroidism, obesity, history MDRO infection, mood disorder, left eye blindness, recent admission with recurrent encephalopathy from 11/30-12/7 was s brought into the emergency department due to concern for mild worsening confusion in addition to leg pain/swelling.  As per the family patient was having 4-6 bowel movements and was taking lactulose with rifaximin but started having increasing confusion with right lower extremity pain and bilateral lower extremity swelling.  Patient then decided to come to the hospital for further evaluation and treatment.    Hospital Course:   Following conditions were addressed during hospitalization as listed below,  Decompensated nonalcoholic steatohepatitis cirrhosis Recurrent hepatic encephalopathy Ascites. Coagulopathy. History of recurrent encephalopathy.  Likely exacerbated by dehydration, possible infection and use of narcotics.  Improved at this time.  MELD-Na is 23 on presentation.  Last EGD 10/24 with no esophageal varices.  Continue spironolactone.   Continue rifaximin and lactulose.  No evidence  of bleeding.  Ammonia level of 57.   Possible RLE cellulitis Bilateral lower extremity edema  History of moderate aortic stenosis, diastolic heart failure right lower extremity with erythema and induration noted likely cellulitis on presentation...  Edema secondary to cirrhosis or possibly heart failure with preserved ejection fraction and aortic stenosis.  Patient initially received Rocephin IV.  Continue intake and output charting Daily weights.  Cellulitis responding with IV Rocephin.  Has bacteremia so we will continue ciprofloxacin for 3 more days to complete total 7-day course.  Patient has completed 5-day course of IV Rocephin in the hospital.   Klebsiella pneumoniae bacteremia.  On IV Rocephin.  Will continue ciprofloxacin to complete 7-day course.  Lactic acidosis  Improved with IV fluids.  Lactic acid of 2.0.   AKI stage I Improved.  Creatinine of 0.9 today.   Non anion gap acidosis Likely related to chronic diarrhea, with component from lactic acidosis.  Latest bicarb of 19.  Check BMP in AM.   Mild hyponatremia, asymptomatic Improved.  Latest sodium of 137   Recent URI symptoms Respiratory viral panel was negative.   Abdominal pain/tenderness History of ventral hernia KUB with large and small bowel dilation despite chronic diarrhea.  Has had chronic abdominal issues.   Grade 3 obesity.   Body mass index is 43.53 kg/m. Would benefit from weight loss as outpatient.   Deconditioning debility.  Seen by physical therapy and did not recommend any skilled therapy needs.  Disposition.  At this time, patient is stable for disposition home with outpatient PCP follow-up.  Spoke with the patient's son at bedside.  Medical Consultants:   None.  Procedures:    None Subjective:   Today, patient seen and examined at bedside.  Feels nauseated.  Denies any fever chills or rigor.  Discharge Exam:   Vitals:   01/06/23 0525 01/06/23 1409  BP: (!) 103/55 129/60  Pulse: 72  87  Resp: 16 16  Temp: 98.1 F (36.7 C) 98.2 F (36.8 C)  SpO2: 98% 100%   Vitals:   01/05/23 2004 01/06/23 0500 01/06/23 0525 01/06/23 1409  BP: (!) 121/58  (!) 103/55 129/60  Pulse: 67  72 87  Resp: 16  16 16   Temp: 98 F (36.7 C)  98.1 F (36.7 C) 98.2 F (36.8 C)  TempSrc: Oral  Oral Oral  SpO2: 99%  98% 100%  Weight:  107.6 kg    Height:       Body mass index is 43.4 kg/m.  General: Morbidly obese, alert awake, not in obvious distress HENT: Left corneal opacity.  No scleral pallor or icterus noted. Oral mucosa is moist.  Chest:  Clear breath sounds.  Diminished breath sounds bilaterally. No crackles or wheezes.  CVS: S1 &S2 heard. No murmur.  Regular rate and rhythm. Abdomen: Soft, nontender, nondistended.  Bowel sounds are heard.   Extremities: No cyanosis, clubbing with some edema.  Right thigh with erythema and induration which has improved.  Psych: Alert, awake and oriented, normal mood CNS:  No cranial nerve deficits.  Power equal in all extremities.   Skin: Warm and dry.  Right thigh erythema.  The results of significant diagnostics from this hospitalization (including imaging, microbiology, ancillary and laboratory) are listed below for reference.     Diagnostic Studies:   ECHOCARDIOGRAM COMPLETE Result Date: 01/03/2023    ECHOCARDIOGRAM REPORT   Patient Name:   [name] Date of Exam: 01/03/2023 Medical Rec #:  [id number]      Height:       62.0 in Accession #:    [id number]     Weight:       282.0 lb Date of Birth:  [birth year]       BSA:          2.212 m Patient Age:    67 years       BP:           102/53 mmHg Patient Gender: F              HR:           87 bpm. Exam Location:  Inpatient Procedure: 2D Echo, Color Doppler and Cardiac Doppler Indications:    Aortic stenosis  History:        Patient has prior history of Echocardiogram examinations, most                 recent 06/13/2022. Signs/Symptoms:Hypotension; Risk Factors:Former                 Smoker.   Sonographer:    Vern Claude Referring Phys: [id number] [name] IMPRESSIONS  1. Left ventricular ejection fraction, by estimation, is 60 to 65%. The left ventricle has normal function. The left ventricle has no regional wall motion abnormalities. There is mild asymmetric left ventricular hypertrophy of the septal segment. Left ventricular diastolic function could not be evaluated.  2. Right ventricular systolic function is normal. The right ventricular size is normal. There is mildly elevated pulmonary artery systolic pressure.  3. The mitral valve is degenerative. No evidence of mitral valve regurgitation. Mild mitral stenosis. The mean mitral valve gradient is 4.0 mmHg. Severe mitral annular calcification.  4. Normal stroke volume index, DVI 0.33; Peak velocity on highest gradient over 4 m/s. The aortic valve is tricuspid.  There is mild calcification of the aortic valve. There is mild thickening of the aortic valve. Aortic valve regurgitation is not visualized. Severe aortic valve stenosis. Aortic valve mean gradient measures 36.0 mmHg. Aortic valve Vmax measures 4.09 m/s. Comparison(s): Prior images reviewed side by side. Gradients are higher on this assessment. FINDINGS  Left Ventricle: Left ventricular ejection fraction, by estimation, is 60 to 65%. The left ventricle has normal function. The left ventricle has no regional wall motion abnormalities. The left ventricular internal cavity size was normal in size. There is  mild asymmetric left ventricular hypertrophy of the septal segment. Left ventricular diastolic function could not be evaluated due to mitral annular calcification (moderate or greater). Left ventricular diastolic function could not be evaluated. Right Ventricle: The right ventricular size is normal. No increase in right ventricular wall thickness. Right ventricular systolic function is normal. There is mildly elevated pulmonary artery systolic pressure. The tricuspid regurgitant velocity is  2.96  m/s, and with an assumed right atrial pressure of 3 mmHg, the estimated right ventricular systolic pressure is 38.0 mmHg. Left Atrium: Left atrial size was normal in size. Right Atrium: Right atrial size was normal in size. Pericardium: There is no evidence of pericardial effusion. Mitral Valve: The mitral valve is degenerative in appearance. Severe mitral annular calcification. No evidence of mitral valve regurgitation. Mild mitral valve stenosis. MV peak gradient, 7.3 mmHg. The mean mitral valve gradient is 4.0 mmHg. Tricuspid Valve: The tricuspid valve is grossly normal. Tricuspid valve regurgitation is mild. Aortic Valve: Normal stroke volume index, DVI 0.33; Peak velocity on highest gradient over 4 m/s. The aortic valve is tricuspid. There is mild calcification of the aortic valve. There is mild thickening of the aortic valve. Aortic valve regurgitation is not visualized. Severe aortic stenosis is present. Aortic valve mean gradient measures 36.0 mmHg. Aortic valve peak gradient measures 66.9 mmHg. Aortic valve area, by VTI measures 1.10 cm. Pulmonic Valve: The pulmonic valve was grossly normal. Pulmonic valve regurgitation is not visualized. No evidence of pulmonic stenosis. Aorta: The aortic root and ascending aorta are structurally normal, with no evidence of dilitation. IAS/Shunts: The atrial septum is grossly normal.  LEFT VENTRICLE PLAX 2D LVIDd:         5.10 cm      Diastology LVIDs:         3.20 cm      LV e' medial:    8.27 cm/s LV PW:         1.00 cm      LV E/e' medial:  14.9 LV IVS:        1.30 cm      LV e' lateral:   9.36 cm/s LVOT diam:     1.90 cm      LV E/e' lateral: 13.1 LV SV:         78 LV SV Index:   35 LVOT Area:     2.84 cm  LV Volumes (MOD) LV vol d, MOD A2C: 203.0 ml LV vol d, MOD A4C: 227.0 ml LV vol s, MOD A2C: 67.2 ml LV vol s, MOD A4C: 84.4 ml LV SV MOD A2C:     135.8 ml LV SV MOD A4C:     227.0 ml LV SV MOD BP:      139.2 ml RIGHT VENTRICLE             IVC RV Basal diam:   3.90 cm     IVC diam: 2.20 cm RV Mid diam:  2.50 cm RV S prime:     13.80 cm/s TAPSE (M-mode): 2.2 cm LEFT ATRIUM           Index        RIGHT ATRIUM           Index LA diam:      4.40 cm 1.99 cm/m   RA Area:     14.30 cm LA Vol (A2C): 73.2 ml 33.09 ml/m  RA Volume:   30.30 ml  13.70 ml/m LA Vol (A4C): 26.8 ml 12.11 ml/m  AORTIC VALVE                     PULMONIC VALVE AV Area (Vmax):    0.97 cm      PV Vmax:          1.99 m/s AV Area (Vmean):   1.21 cm      PV Peak grad:     15.8 mmHg AV Area (VTI):     1.10 cm      PR End Diast Vel: 4.33 msec AV Vmax:           409.00 cm/s AV Vmean:          224.600 cm/s AV VTI:            0.708 m AV Peak Grad:      66.9 mmHg AV Mean Grad:      36.0 mmHg LVOT Vmax:         140.00 cm/s LVOT Vmean:        95.500 cm/s LVOT VTI:          0.275 m LVOT/AV VTI ratio: 0.39  AORTA Ao Root diam: 3.00 cm Ao Asc diam:  3.20 cm MITRAL VALVE                TRICUSPID VALVE MV Area (PHT): 4.29 cm     TR Peak grad:   35.0 mmHg MV Area VTI:   2.42 cm     TR Vmax:        296.00 cm/s MV Peak grad:  7.3 mmHg MV Mean grad:  4.0 mmHg     SHUNTS MV Vmax:       1.36 m/s     Systemic VTI:  0.28 m MV Vmean:      91.8 cm/s    Systemic Diam: 1.90 cm MV Decel Time: 177 msec MV E velocity: 123.00 cm/s MV A velocity: 103.70 cm/s MV E/A ratio:  1.19 Riley Lam MD Electronically signed by Riley Lam MD Signature Date/Time: 01/03/2023/6:56:06 PM    Final    DG Abd 1 View Result Date: 01/03/2023 CLINICAL DATA:  67 year old female with confusion, ileus. EXAM: ABDOMEN - 1 VIEW COMPARISON:  CT Abdomen and Pelvis 10/01/2022 and earlier. FINDINGS: Stable cholecystectomy clips. Lung bases appear stable, negative. Augmented T12 compression fracture since September. Bulky calcified uterine fibroids redemonstrated in the pelvis. Gas-filled bowel loops in the abdomen and pelvis do appear to be both large and small bowel, while the stomach seems decompressed. Osteopenia. Stable abdominal and  pelvic visceral contours. No definite pneumoperitoneum on these two views which may be supine. IMPRESSION: 1. Gas-filled small and large bowel bowel loops is compatible with ileus. Distal large bowel obstruction felt less likely. Follow-up abdominal radiographs may be valuable. 2. Bulky calcified uterine fibroids. Osteopenia. Augmented T12 compression fracture. Electronically Signed   By: Odessa Fleming M.D.   On: 01/03/2023 05:48   DG Chest Portable 1 View Result  Date: 01/03/2023 CLINICAL DATA:  Confusion, tachycardia, tachypnea EXAM: PORTABLE CHEST 1 VIEW COMPARISON:  09/30/2022 FINDINGS: Cardiomegaly. Low lung volumes. Mild diffuse interstitial prominence throughout the lungs. No effusions. No acute bony abnormality. Aortic atherosclerosis. IMPRESSION: 1. Cardiomegaly. 2. Low lung volumes. Diffuse interstitial prominence could reflect edema or atypical infection. Electronically Signed   By: Charlett Nose M.D.   On: 01/03/2023 00:26     Labs:   Basic Metabolic Panel: Recent Labs  Lab 01/02/23 2257 01/03/23 0616 01/03/23 0617 01/05/23 0542 01/06/23 0904  NA 132* 132*  --  132* 137  K 4.7 4.6  --  4.2 4.1  CL 109 109  --  109 113*  CO2 16* 16*  --  18* 19*  GLUCOSE 120* 82  --  106* 106*  BUN 20 20  --  22 18  CREATININE 1.16* 0.94  --  1.27* 0.98  CALCIUM 8.2* 7.9*  --  7.7* 8.2*  MG  --   --  1.6* 1.9  --   PHOS  --   --  3.0  --   --    GFR Estimated Creatinine Clearance: 66 mL/min (by C-G formula based on SCr of 0.98 mg/dL). Liver Function Tests: Recent Labs  Lab 01/02/23 2257 01/03/23 0617 01/05/23 0542  AST 33 25 22  ALT 20 16 17   ALKPHOS 81 62 82  BILITOT 2.3* 2.1* 1.2*  PROT 5.7* 4.7* 5.1*  ALBUMIN 2.9* 2.3* 2.4*   No results for input(s): "LIPASE", "AMYLASE" in the last 168 hours. Recent Labs  Lab 01/02/23 2257 01/05/23 0531  AMMONIA 86* 57*   Coagulation profile Recent Labs  Lab 01/02/23 2257 01/04/23 0955  INR 2.2* 2.3*    CBC: Recent Labs  Lab  01/02/23 2257 01/03/23 0616 01/05/23 0531 01/06/23 0904  WBC 7.3 5.4 4.7 5.9  NEUTROABS 6.2  --   --   --   HGB 9.3* 7.8* 7.7* 8.5*  HCT 29.7* 24.8* 24.6* 28.2*  MCV 95.2 93.2 93.2 94.9  PLT 106* 80* 105* 135*   Cardiac Enzymes: No results for input(s): "CKTOTAL", "CKMB", "CKMBINDEX", "TROPONINI" in the last 168 hours. BNP: Invalid input(s): "POCBNP" CBG: No results for input(s): "GLUCAP" in the last 168 hours. D-Dimer No results for input(s): "DDIMER" in the last 72 hours. Hgb A1c No results for input(s): "HGBA1C" in the last 72 hours. Lipid Profile No results for input(s): "CHOL", "HDL", "LDLCALC", "TRIG", "CHOLHDL", "LDLDIRECT" in the last 72 hours. Thyroid function studies No results for input(s): "TSH", "T4TOTAL", "T3FREE", "THYROIDAB" in the last 72 hours.  Invalid input(s): "FREET3" Anemia work up No results for input(s): "VITAMINB12", "FOLATE", "FERRITIN", "TIBC", "IRON", "RETICCTPCT" in the last 72 hours. Microbiology Recent Results (from the past 240 hours)  Blood Culture (routine x 2)     Status: Abnormal   Collection Time: 01/02/23 10:51 PM   Specimen: BLOOD  Result Value Ref Range Status   Specimen Description   Final    BLOOD BLOOD RIGHT ARM Performed at [facility], [address], [place], [place] [address]    Special Requests   Final    BOTTLES DRAWN AEROBIC AND ANAEROBIC Blood Culture results may not be optimal due to an inadequate volume of blood received in culture bottles Performed at [facility], [address] [place] [address], [place], [place] [address]    Culture  Setup Time   Final    GRAM NEGATIVE RODS BOTTLES DRAWN AEROBIC ONLY CRITICAL RESULT CALLED TO, READ BACK BY AND VERIFIED  WITH: PHARMD ABBEY ELLINGTON ON 01/03/23 @ 1721 BY DRT Performed at [facility], [address] [place], [place] [address]    Culture KLEBSIELLA PNEUMONIAE (A)  Final   Report Status 01/06/2023 FINAL  Final   Organism ID, Bacteria  KLEBSIELLA PNEUMONIAE  Final   Organism ID, Bacteria KLEBSIELLA PNEUMONIAE  Final      Susceptibility   Klebsiella pneumoniae - MIC*    AMPICILLIN >=32 RESISTANT Resistant     CEFEPIME <=0.12 SENSITIVE Sensitive     CEFTAZIDIME <=1 SENSITIVE Sensitive     CEFTRIAXONE <=0.25 SENSITIVE Sensitive     CIPROFLOXACIN <=0.25 SENSITIVE Sensitive     GENTAMICIN <=1 SENSITIVE Sensitive     IMIPENEM <=0.25 SENSITIVE Sensitive     TRIMETH/SULFA <=20 SENSITIVE Sensitive     AMPICILLIN/SULBACTAM >=32 RESISTANT Resistant     PIP/TAZO 32 INTERMEDIATE Intermediate ug/mL   Klebsiella pneumoniae - KIRBY BAUER*    CEFAZOLIN RESISTANT Resistant     * KLEBSIELLA PNEUMONIAE    KLEBSIELLA PNEUMONIAE  Blood Culture ID Panel (Reflexed)     Status: Abnormal   Collection Time: 01/02/23 10:51 PM  Result Value Ref Range Status   Enterococcus faecalis NOT DETECTED NOT DETECTED Final   Enterococcus Faecium NOT DETECTED NOT DETECTED Final   Listeria monocytogenes NOT DETECTED NOT DETECTED Final   Staphylococcus species NOT DETECTED NOT DETECTED Final   Staphylococcus aureus (BCID) NOT DETECTED NOT DETECTED Final   Staphylococcus epidermidis NOT DETECTED NOT DETECTED Final    Comment: NOT DETECTED   Staphylococcus lugdunensis NOT DETECTED NOT DETECTED Final    Comment: NOT DETECTED   Streptococcus species NOT DETECTED NOT DETECTED Final   Streptococcus agalactiae NOT DETECTED NOT DETECTED Final   Streptococcus pneumoniae NOT DETECTED NOT DETECTED Final   Streptococcus pyogenes NOT DETECTED NOT DETECTED Final   A.calcoaceticus-baumannii NOT DETECTED NOT DETECTED Final   Bacteroides fragilis NOT DETECTED NOT DETECTED Final   Enterobacterales DETECTED (A) NOT DETECTED Corrected    Comment: CRITICAL RESULT CALLED TO, READ BACK BY AND VERIFIED WITH: Sophronia Simas PHARMD, AT [id number] 01/04/23 C.MORARU CORRECTED ON 12/25 AT 5956: PREVIOUSLY REPORTED AS NOT DETECTED    Enterobacter cloacae complex NOT DETECTED NOT DETECTED  Final   Escherichia coli NOT DETECTED NOT DETECTED Final   Klebsiella aerogenes NOT DETECTED NOT DETECTED Final   Klebsiella oxytoca NOT DETECTED NOT DETECTED Final   Klebsiella pneumoniae DETECTED (A) NOT DETECTED Corrected    Comment: CRITICAL RESULT CALLED TO, READ BACK BY AND VERIFIED WITH: Sophronia Simas PHARMD, AT [id number] 01/04/23 C.MORARU CORRECTED ON 12/25 AT 0909: PREVIOUSLY REPORTED AS NOT DETECTED    Proteus species NOT DETECTED NOT DETECTED Final   Salmonella species NOT DETECTED NOT DETECTED Final   Serratia marcescens NOT DETECTED NOT DETECTED Final   Haemophilus influenzae NOT DETECTED NOT DETECTED Final   Neisseria meningitidis NOT DETECTED NOT DETECTED Final   Pseudomonas aeruginosa NOT DETECTED NOT DETECTED Final   Stenotrophomonas maltophilia NOT DETECTED NOT DETECTED Final   Candida albicans NOT DETECTED NOT DETECTED Final   Candida auris NOT DETECTED NOT DETECTED Final   Candida glabrata NOT DETECTED NOT DETECTED Final   Candida krusei NOT DETECTED NOT DETECTED Final   Candida parapsilosis NOT DETECTED NOT DETECTED Final   Candida tropicalis NOT DETECTED NOT DETECTED Final   Cryptococcus neoformans/gattii NOT DETECTED NOT DETECTED Final    Comment: Performed at [facility], [address], [place], [place] [address]  Respiratory (~20 pathogens) panel by PCR  Status: None   Collection Time: 01/03/23  5:11 AM   Specimen: Nasopharyngeal Swab; Respiratory  Result Value Ref Range Status   Adenovirus NOT DETECTED NOT DETECTED Final   Coronavirus 229E NOT DETECTED NOT DETECTED Final    Comment: (NOTE) The Coronavirus on the Respiratory Panel, DOES NOT test for the novel  Coronavirus (2019 nCoV)    Coronavirus HKU1 NOT DETECTED NOT DETECTED Final   Coronavirus NL63 NOT DETECTED NOT DETECTED Final   Coronavirus OC43 NOT DETECTED NOT DETECTED Final   Metapneumovirus NOT DETECTED NOT DETECTED Final   Rhinovirus / Enterovirus NOT DETECTED NOT DETECTED Final   Influenza  A NOT DETECTED NOT DETECTED Final   Influenza B NOT DETECTED NOT DETECTED Final   Parainfluenza Virus 1 NOT DETECTED NOT DETECTED Final   Parainfluenza Virus 2 NOT DETECTED NOT DETECTED Final   Parainfluenza Virus 3 NOT DETECTED NOT DETECTED Final   Parainfluenza Virus 4 NOT DETECTED NOT DETECTED Final   Respiratory Syncytial Virus NOT DETECTED NOT DETECTED Final   Bordetella pertussis NOT DETECTED NOT DETECTED Final   Bordetella Parapertussis NOT DETECTED NOT DETECTED Final   Chlamydophila pneumoniae NOT DETECTED NOT DETECTED Final   Mycoplasma pneumoniae NOT DETECTED NOT DETECTED Final    Comment: Performed at [facility], [address] [place], [place] [address]  Blood Culture (routine x 2)     Status: None (Preliminary result)   Collection Time: 01/03/23  8:48 AM   Specimen: BLOOD  Result Value Ref Range Status   Specimen Description   Final    BLOOD LEFT ANTECUBITAL Performed at [facility], [address], [place], [place] [address]    Special Requests   Final    BOTTLES DRAWN AEROBIC ONLY Blood Culture results may not be optimal due to an inadequate volume of blood received in culture bottles Performed at [facility], [address], [place], [place] [address]    Culture   Final    NO GROWTH 3 DAYS Performed at [facility], [address] [place], [place] [address]    Report Status PENDING  Incomplete     Discharge Instructions:   Discharge Instructions     Call MD for:  persistant nausea and vomiting   Complete by: As directed    Call MD for:  severe uncontrolled pain   Complete by: As directed    Call MD for:  temperature >100.4   Complete by: As directed    Diet - low sodium heart healthy   Complete by: As directed    Discharge instructions   Complete by: As directed    Follow-up with your primary care provider in 1 week.  Continue lactulose and rifaximin at home.  Complete course of antibiotic.  Seek  medical attention for worsening symptoms including increasing redness swelling or recurrent fever.   Increase activity slowly   Complete by: As directed       Allergies as of 01/06/2023       Reactions   Ace Inhibitors Anaphylaxis, Swelling   Ivp Dye [iodinated Contrast Media] Anaphylaxis   Desitin [zinc Oxide] Rash, Other (See Comments)   Worsens rash   Gadolinium Derivatives Other (See Comments)   Reaction??   Hydroxyzine Other (See Comments)   Affects the mind   Chlorhexidine Rash, Other (See Comments)   WORSENS RASHES   Doxycycline Rash   Penicillins Rash   Zofran [ondansetron] Rash        Medication List  TAKE these medications    atorvastatin 40 MG tablet Commonly known as: LIPITOR Take 40 mg by mouth in the morning.   ciprofloxacin 500 MG tablet Commonly known as: Cipro Take 1 tablet (500 mg total) by mouth 2 (two) times daily for 3 days. Start taking on: January 07, 2023   colchicine 0.6 MG tablet Take 0.6 mg by mouth daily as needed (gout flares).   cyanocobalamin 1000 MCG tablet Commonly known as: VITAMIN B12 Take 2,000 mcg by mouth every Friday.   dicyclomine 10 MG capsule Commonly known as: BENTYL Take 1 capsule (10 mg total) by mouth 3 (three) times daily as needed for spasms (abdominal cramping).   EPINEPHrine 0.3 mg/0.3 mL Soaj injection Commonly known as: EPI-PEN Inject 0.3 mg into the muscle as needed for anaphylaxis.   famotidine 20 MG tablet Commonly known as: PEPCID Take 20 mg by mouth in the morning.   folic acid 1 MG tablet Commonly known as: FOLVITE Take 1 tablet (1 mg total) by mouth daily.   furosemide 40 MG tablet Commonly known as: LASIX Take 1 tablet (40 mg total) by mouth daily. Take additional 40 mg if there is a weight gain of 3 lbs in1 day or 5 lbs in 1 week.   HYDROcodone-acetaminophen 10-325 MG tablet Commonly known as: NORCO Take 1 tablet by mouth every 8 (eight) hours as needed for moderate pain (pain score  4-6).   IRON-VITAMIN C PO Take 1 tablet by mouth daily with breakfast.   lactulose 10 GM/15ML solution Commonly known as: CHRONULAC Take 30 mLs (20 g total) by mouth 3 (three) times daily. Take to ensure that there is 2 loose bowel movement EVERY DAY What changed:  when to take this reasons to take this additional instructions   levothyroxine 150 MCG tablet Commonly known as: SYNTHROID Take 150 mcg by mouth daily before breakfast.   lidocaine 5 % Commonly known as: LIDODERM Place 1 patch onto the skin daily. Remove & Discard patch within 12 hours or as directed by MD What changed:  when to take this reasons to take this additional instructions   methocarbamol 500 MG tablet Commonly known as: ROBAXIN Take 500 mg by mouth in the morning.   midodrine 10 MG tablet Commonly known as: PROAMATINE Take 1 tablet (10 mg total) by mouth 3 (three) times daily with meals.   naloxone 4 MG/0.1ML Liqd nasal spray kit Commonly known as: NARCAN Place 1 spray into the nose as needed (accidental overdose).   nystatin powder Apply 1 Application topically 3 (three) times daily as needed (for irritation- affected areas). What changed: Another medication with the same name was changed. Make sure you understand how and when to take each.   nystatin cream Commonly known as: MYCOSTATIN Apply topically 2 (two) times daily. What changed:  how much to take when to take this reasons to take this   pantoprazole 40 MG tablet Commonly known as: PROTONIX Take 1 tablet (40 mg total) by mouth 2 (two) times daily before a meal.   promethazine 12.5 MG tablet Commonly known as: PHENERGAN Take 12.5 mg by mouth every 6 (six) hours as needed for nausea or vomiting.   rifaximin 550 MG Tabs tablet Commonly known as: XIFAXAN Take 550 mg by mouth 2 (two) times daily.   rOPINIRole 0.25 MG tablet Commonly known as: REQUIP Take 1 tablet (0.25 mg total) by mouth at bedtime. What changed: when to take  this   spironolactone 50 MG tablet Commonly known as:  ALDACTONE Take 1 tablet (50 mg total) by mouth daily.   tamsulosin 0.4 MG Caps capsule Commonly known as: FLOMAX Take 1 capsule (0.4 mg total) by mouth daily.   triamcinolone cream 0.5 % Commonly known as: KENALOG Apply to affected area of right breast twice daily as directed. What changed:  how much to take when to take this reasons to take this   TYLENOL 500 MG tablet Generic drug: acetaminophen Take 1,000 mg by mouth every 6 (six) hours as needed for mild pain (pain score 1-3) or headache.   venlafaxine XR 75 MG 24 hr capsule Commonly known as: EFFEXOR-XR Take 75 mg by mouth daily with breakfast.   Vitamin D (Ergocalciferol) 1.25 MG (50000 UNIT) Caps capsule Commonly known as: DRISDOL Take 50,000 Units by mouth every Wednesday.        Follow-up Information     Kara Hoit, MD Follow up in 1 week(s).   Specialty: Family Medicine Contact information: [address] [place] [address] [phone]                  Time coordinating discharge: 39 minutes  Signed:  Mikeisha Lemonds  [facility] 01/06/2023, 3:33 PM

## 2023-01-07 DIAGNOSIS — K7682 Hepatic encephalopathy: Secondary | ICD-10-CM | POA: Diagnosis not present

## 2023-01-07 LAB — BASIC METABOLIC PANEL
Anion gap: 7 (ref 5–15)
BUN: 18 mg/dL (ref 8–23)
CO2: 17 mmol/L — ABNORMAL LOW (ref 22–32)
Calcium: 7.9 mg/dL — ABNORMAL LOW (ref 8.9–10.3)
Chloride: 114 mmol/L — ABNORMAL HIGH (ref 98–111)
Creatinine, Ser: 1.03 mg/dL — ABNORMAL HIGH (ref 0.44–1.00)
GFR, Estimated: >60 mL/min (ref >60)
Glucose, Bld: 137 mg/dL — ABNORMAL HIGH (ref 70–99)
Potassium: 3.9 mmol/L (ref 3.5–5.1)
Sodium: 138 mmol/L (ref 135–145)

## 2023-01-07 LAB — CBC
HCT: 25.6 % — ABNORMAL LOW (ref 36.0–46.0)
Hemoglobin: 7.9 g/dL — ABNORMAL LOW (ref 12.0–15.0)
MCH: 29.5 pg (ref 26.0–34.0)
MCHC: 30.9 g/dL (ref 30.0–36.0)
MCV: 95.5 fL (ref 80.0–100.0)
Platelets: 110 10*3/uL — ABNORMAL LOW (ref 150–400)
RBC: 2.68 MIL/uL — ABNORMAL LOW (ref 3.87–5.11)
RDW: 16.6 % — ABNORMAL HIGH (ref 11.5–15.5)
WBC: 4.5 10*3/uL (ref 4.0–10.5)
nRBC: 0 % (ref 0.0–0.2)

## 2023-01-07 LAB — HEMOGLOBIN AND HEMATOCRIT, BLOOD
HCT: 25.3 % — ABNORMAL LOW (ref 36.0–46.0)
Hemoglobin: 8 g/dL — ABNORMAL LOW (ref 12.0–15.0)

## 2023-01-07 MED ORDER — SODIUM CHLORIDE 0.9 % IV SOLN
INTRAVENOUS | Status: DC
Start: 2023-01-07 — End: 2023-01-08

## 2023-01-07 MED ORDER — CEFTRIAXONE SODIUM 2 G IJ SOLR
2.0000 g | INTRAMUSCULAR | Status: DC
  Administered 2023-01-07 – 2023-01-09 (×3): 2 g via INTRAVENOUS
  Filled 2023-01-07 (×3): qty 20

## 2023-01-07 MED ORDER — PHYTONADIONE 5 MG PO TABS
5.0000 mg | ORAL_TABLET | Freq: Once | ORAL | Status: AC
  Administered 2023-01-07: 5 mg via ORAL
  Filled 2023-01-07: qty 1

## 2023-01-07 MED ORDER — MEGESTROL ACETATE 40 MG PO TABS
40.0000 mg | ORAL_TABLET | Freq: Every day | ORAL | Status: DC
Start: 2023-01-07 — End: 2023-01-07
  Administered 2023-01-07: 40 mg via ORAL
  Filled 2023-01-07: qty 1

## 2023-01-07 MED ORDER — LACTULOSE 10 GM/15ML PO SOLN
20.0000 g | Freq: Two times a day (BID) | ORAL | Status: DC
Start: 2023-01-07 — End: 2023-01-13
  Administered 2023-01-07 – 2023-01-13 (×10): 20 g via ORAL
  Filled 2023-01-07 (×12): qty 30

## 2023-01-07 MED ORDER — MEGESTROL ACETATE 40 MG PO TABS
40.0000 mg | ORAL_TABLET | Freq: Two times a day (BID) | ORAL | Status: DC
Start: 2023-01-07 — End: 2023-01-13
  Administered 2023-01-07 – 2023-01-13 (×12): 40 mg via ORAL
  Filled 2023-01-07 (×14): qty 1

## 2023-01-07 NOTE — Progress Notes (Addendum)
PROGRESS NOTE    [name]  ZOX:[id number] DOB: [birth year] DOA: 01/02/2023 PCP: [name], MD    Brief Narrative:    [name] is a 67 y.o. 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  As per the family patient was having 4-6 bowel movements and was taking lactulose with rifaximin but started having increasing confusion with right lower extremity pain and bilateral lower extremity swelling.  Patient then decided to come to the hospital for further evaluation and treatment.  Assessment and plan.  Decompensated nonalcoholic steatohepatitis cirrhosis Recurrent hepatic encephalopathy Ascites. Coagulopathy. History of recurrent encephalopathy.  Likely exacerbated by dehydration possible infection and use of narcotics.  MELD-Na is 23 on presentation.  Last EGD 10/24/ with no esophageal varices.  Continue spironolactone. Mental status at baseline at this time.  Continue rifaximin and lactulose.  No evidence of bleeding.  Ammonia 57 today.  Will continue with IV fluids appears to be slightly volume depleted.  Had only 1 episode of bowel movement today.  Patient needs to ensure that she has at least 2-3 bowel movements a day.  Will increase the dose of lactulose from daily to twice a day for now.  Vaginal bleeding.  Likely secondary to fibroids.  Started since at 01/06/2023.  Patient had similar bleeding in the past and was seen by OB/GYN at that time and was prescribed Megace and vitamin K as per the patient's son..  INR is elevated from coagulopathy.  Will discontinue Lovenox.   Give 1 dose of vitamin K today.  Will start with Megace 40 mg twice daily like last time.  Was seen by Dr. Vergie Living gynecology 10/07/2022.  If patient continues to bleed might need OB/GYN to see the patient again.  Patient is supposed to follow-up January 1 week for follow-up of her fibroids in the OB/GYN clinic as per the patient..  Hemoglobin of 7.9 today.  Transfuse for hemoglobin less than 7.  Put in for pad counts.  Check CBC in AM.  Possible RLE cellulitis Bilateral lower extremity edema  History of moderate aortic stenosis, diastolic heart failure right lower extremity with erythema and induration noted likely cellulitis..  Edema secondary to cirrhosis or possibly heart failure with preserved ejection fraction and aortic stenosis.  Continue Rocephin IV.  Continue intake and output charting Daily weights.  Cellulitis responding with IV Rocephin.  Has bacteremia.  Will need to continue ciprofloxacin on discharge to complete the course.  Klebsiella pneumoniae bacteremia.  On IV Rocephin  Plan to continue ciprofloxacin on discharge.  Lactic acidosis  Improved with IV fluids.  Lactic acid of 2.0.  Will continue IV fluids again today.  Dizziness weakness decrease intake as per the patient's son at bedside.  Will continue with IV fluids.  Monitor closely for vaginal bleeding.  Start Megace.  Continue PT for ambulation.   AKI stage I Improved.  Creatinine of 1.0 today.  Non anion gap acidosis Likely related to chronic diarrhea, with component from lactic acidosis.  Latest bicarb of 17.  Check BMP in AM.  Had only 1 episode of bowel movement today.  Will need to ensure that she has at least 2-3 bowel movements a day.   Mild hyponatremia,  asymptomatic Improved.  Latest sodium of 138.Marland Kitchen    Recent URI symptoms Respiratory viral panel was negative.   Abdominal pain/tenderness History of ventral hernia KUB with large and small bowel dilation despite chronic diarrhea.  Has had chronic abdominal  issues.  Grade 3 obesity.   Body mass index is 43.4 kg/m. Would benefit from weight loss as outpatient.  Deconditioning debility.  PT had seen the patient on 01/06/2023 with recommendations for home health PT on discharge.    DVT prophylaxis: Place and maintain sequential compression device Start: 01/07/23 1046   Code Status:     Code Status: Do not attempt resuscitation (DNR) PRE-ARREST INTERVENTIONS DESIRED  Disposition: Home with home health likely 01/08/2023 if clinically improved.    Status is: Inpatient Remains inpatient appropriate because: Pending clinical improvement, bacteremia, IV antibiotic, new onset vaginal bleeding.   Family Communication:   Spoke with the patient's son at bedside 01/07/2023  Consultants:  None  Procedures:  None  Antimicrobials:  Rocephin IV  Anti-infectives (From admission, onward)    Start     Dose/Rate Route Frequency Ordered Stop   01/07/23 1800  cefTRIAXone (ROCEPHIN) 2 g in sodium chloride 0.9 % 100 mL IVPB        2 g 200 mL/hr over 30 Minutes Intravenous Every 24 hours 01/07/23 1057     01/07/23 0000  ciprofloxacin (CIPRO) 500 MG tablet        500 mg Oral 2 times daily 01/06/23 1510 01/10/23 2359   01/06/23 1700  cefTRIAXone (ROCEPHIN) 2 g in sodium chloride 0.9 % 100 mL IVPB        2 g 200 mL/hr over 30 Minutes Intravenous Every 24 hours 01/06/23 1410 01/06/23 1850   01/06/23 0000  ciprofloxacin (CIPRO) 500 MG tablet  Status:  Discontinued        500 mg Oral 2 times daily 01/06/23 1411 01/06/23    01/03/23 2200  cefTRIAXone (ROCEPHIN) 2 g in sodium chloride 0.9 % 100 mL IVPB  Status:  Discontinued        2 g 200 mL/hr over 30 Minutes Intravenous Every 24 hours 01/03/23 0632 01/06/23 1410   01/03/23 1000  rifaximin (XIFAXAN) tablet 550 mg        550 mg Oral 2 times daily 01/03/23 0653     01/02/23 2345  cefTRIAXone (ROCEPHIN) 2 g in sodium chloride 0.9 % 100 mL IVPB        2 g 200 mL/hr over 30 Minutes Intravenous  Once  01/02/23 2343 01/03/23 0119       Subjective: Today, patient was seen and examined at bedside.  Patient's son reported that patient had been having some vaginal bleeding had few sips of vomiting yesterday and seems to be little more less alert, with decreased intake.   Objective: Vitals:   01/06/23 0525 01/06/23 1409 01/06/23 2218 01/07/23 0602  BP: (!) 103/55 129/60 (!) 123/54   Pulse: 72 87 84 75  Resp: 16 16 20 20   Temp: 98.1 F (36.7 C) 98.2 F (36.8 C) 98.1 F (36.7 C) 98.6 F (37 C)  TempSrc: Oral Oral Oral Oral  SpO2: 98% 100% 100% 99%  Weight:      Height:        Intake/Output Summary (Last 24 hours) at 01/07/2023 1454 Last data filed at 01/06/2023 2159 Gross per 24 hour  Intake 3 ml  Output --  Net 3 ml   Filed Weights   01/02/23 2153 01/04/23 0533 01/06/23 0500  Weight:  127.9 kg 108 kg 107.6 kg    Physical Examination: Body mass index is 43.4 kg/m.   General: Morbidly obese built, not in obvious distress, interactive, slightly somnolent, HENT:   No scleral pallor or icterus noted. Oral mucosa is moist.  Left corneal opacity Chest:   Diminished breath sounds bilaterally. No crackles or wheezes.  CVS: S1 &S2 heard. No murmur.  Regular rate and rhythm. Abdomen: Soft, nontender, nondistended.  Bowel sounds are heard.   Extremities: No cyanosis, clubbing redness of the right thigh with some induration has improved., bilateral lower extremity trace edema. Psych: Alert, awake and Communicative, CNS:  No cranial nerve deficits.  Moves all extremities, generalized weakness noted. Skin: Warm and dry.  No rashes noted.  Data Reviewed:   CBC: Recent Labs  Lab 01/02/23 2257 01/03/23 0616 01/05/23 0531 01/06/23 0904 01/07/23 1117  WBC 7.3 5.4 4.7 5.9 4.5  NEUTROABS 6.2  --   --   --   --   HGB 9.3* 7.8* 7.7* 8.5* 7.9*  HCT 29.7* 24.8* 24.6* 28.2* 25.6*  MCV 95.2 93.2 93.2 94.9 95.5  PLT 106* 80* 105* 135* 110*    Basic Metabolic Panel: Recent Labs   Lab 01/02/23 2257 01/03/23 0616 01/03/23 0617 01/05/23 0542 01/06/23 0904 01/07/23 1117  NA 132* 132*  --  132* 137 138  K 4.7 4.6  --  4.2 4.1 3.9  CL 109 109  --  109 113* 114*  CO2 16* 16*  --  18* 19* 17*  GLUCOSE 120* 82  --  106* 106* 137*  BUN 20 20  --  22 18 18   CREATININE 1.16* 0.94  --  1.27* 0.98 1.03*  CALCIUM 8.2* 7.9*  --  7.7* 8.2* 7.9*  MG  --   --  1.6* 1.9  --   --   PHOS  --   --  3.0  --   --   --     Liver Function Tests: Recent Labs  Lab 01/02/23 2257 01/03/23 0617 01/05/23 0542  AST 33 25 22  ALT 20 16 17   ALKPHOS 81 62 82  BILITOT 2.3* 2.1* 1.2*  PROT 5.7* 4.7* 5.1*  ALBUMIN 2.9* 2.3* 2.4*     Radiology Studies: No results found.     LOS: 4 days     Joycelyn Das, MD [facility] Available via Epic secure chat 7am-7pm After these hours, please refer to coverage provider listed on [email or web address] 01/07/2023, 2:54 PM

## 2023-01-07 NOTE — Plan of Care (Signed)

## 2023-01-08 DIAGNOSIS — K7682 Hepatic encephalopathy: Secondary | ICD-10-CM | POA: Diagnosis not present

## 2023-01-08 LAB — BASIC METABOLIC PANEL
Anion gap: 5 (ref 5–15)
BUN: 17 mg/dL (ref 8–23)
CO2: 18 mmol/L — ABNORMAL LOW (ref 22–32)
Calcium: 7.9 mg/dL — ABNORMAL LOW (ref 8.9–10.3)
Chloride: 114 mmol/L — ABNORMAL HIGH (ref 98–111)
Creatinine, Ser: 0.9 mg/dL (ref 0.44–1.00)
GFR, Estimated: >60 mL/min (ref >60)
Glucose, Bld: 101 mg/dL — ABNORMAL HIGH (ref 70–99)
Potassium: 3.4 mmol/L — ABNORMAL LOW (ref 3.5–5.1)
Sodium: 137 mmol/L (ref 135–145)

## 2023-01-08 LAB — MAGNESIUM: Magnesium: 1.8 mg/dL (ref 1.7–2.4)

## 2023-01-08 LAB — HEMOGLOBIN AND HEMATOCRIT, BLOOD
HCT: 24.4 % — ABNORMAL LOW (ref 36.0–46.0)
Hemoglobin: 7.7 g/dL — ABNORMAL LOW (ref 12.0–15.0)

## 2023-01-08 LAB — CULTURE, BLOOD (ROUTINE X 2): Culture: NO GROWTH

## 2023-01-08 MED ORDER — FUROSEMIDE 40 MG PO TABS
40.0000 mg | ORAL_TABLET | Freq: Every day | ORAL | Status: DC
  Administered 2023-01-08 – 2023-01-13 (×6): 40 mg via ORAL
  Filled 2023-01-08 (×6): qty 1

## 2023-01-08 MED ORDER — ALBUMIN HUMAN 5 % IV SOLN
12.5000 g | Freq: Once | INTRAVENOUS | Status: AC
  Administered 2023-01-08: 12.5 g via INTRAVENOUS
  Filled 2023-01-08: qty 250

## 2023-01-08 MED ORDER — SPIRONOLACTONE 25 MG PO TABS
50.0000 mg | ORAL_TABLET | Freq: Every day | ORAL | Status: DC
  Administered 2023-01-08 – 2023-01-13 (×6): 50 mg via ORAL
  Filled 2023-01-08 (×6): qty 2

## 2023-01-08 MED ORDER — MIDODRINE HCL 5 MG PO TABS
10.0000 mg | ORAL_TABLET | Freq: Once | ORAL | Status: AC
  Administered 2023-01-08: 10 mg via ORAL
  Filled 2023-01-08: qty 2

## 2023-01-08 MED ORDER — POTASSIUM CHLORIDE CRYS ER 20 MEQ PO TBCR
40.0000 meq | EXTENDED_RELEASE_TABLET | Freq: Two times a day (BID) | ORAL | Status: AC
  Administered 2023-01-08 – 2023-01-09 (×4): 40 meq via ORAL
  Filled 2023-01-08 (×4): qty 2

## 2023-01-08 NOTE — Progress Notes (Signed)
Mobility Specialist - Progress Note   01/08/23 1010  Mobility  Activity Transferred from bed to chair  Level of Assistance Contact guard assist, steadying assist  Assistive Device Front wheel walker  Range of Motion/Exercises Active  Activity Response Tolerated well  Mobility Referral Yes  Mobility visit 1 Mobility  Mobility Specialist Start Time (ACUTE ONLY) [id number]  Mobility Specialist Stop Time (ACUTE ONLY) 0911  Mobility Specialist Time Calculation (min) (ACUTE ONLY) 18 min   Received in bed and declined hallway ambulation. Opted for transfer, no issues throughout transfer and returned to chair with all needs met.   Marilynne Halsted Mobility Specialist

## 2023-01-08 NOTE — Progress Notes (Signed)
PROGRESS NOTE    [name]  IHK:[id number] DOB: 05-06-1955 DOA: 01/02/2023 PCP: [name], MD    Brief Narrative:  67 year old with decompensated Nash cirrhosis and recurrent hepatic encephalopathy, hypotension on midodrine, PE and DVT now off anticoagulation, short gut syndrome, hypothyroidism, obesity, MDRO infection, mood disorder, left eye blindness, recent encephalopathy presented back to the hospital with worsening confusion in addition to leg pain and swelling.  She was taking lactulose and already was having 4-6 bowel movements a day.  Lives at home with her son.  Admitted with hepatic encephalopathy.  Subjective: Patient seen in the morning rounds.  Went back to see the patient with her son at the bedside.  Patient told me that she was doing fairly well still has bleeding through her vagina but slowing down than before. Patient's son is stated that she has worsening swelling along her abdomen and chest wall now. He is in need of more help at home to take her as they have a ramp and it is rainy and wet. Placed back patient on Lasix and spironolactone.  Tentative discharge tomorrow morning once again have more assistance at home.  Assessment & Plan:   Recurrent hepatic encephalopathy, ascites and coagulopathy.  Decompensated nonalcoholic hepatic cirrhosis: Likely exacerbated by dehydration, infection and use of narcotics. Patient had recently undergone EGD with no esophageal varices. Mental status improved after treatment with lactulose and rifaximin.  Ammonia has trended down. Continue lactulose to make at least 3-4 bowel movements a day. Continue rifaximin. Outpatient follow-up with GI.  Suspected right lower extremity cellulitis, bilateral lower extremity edema, moderate aortic stenosis and diastolic heart failure: Patient was treated for cellulitis on top of her chronic edema.  She is receiving IV Rocephin. Blood cultures on admission positive for Klebsiella  pneumonia.  Rocephin day 6 today.  Will continue ciprofloxacin on discharge to complete 10 days of therapy.  Clinically improving.  Dysfunctional uterine bleeding: Related to fibroids and also coagulopathic with liver failure.  Previously seen by OB/GYN.  She has an appointment with OB/GYN next week.  Previously responded to Megace. Lovenox discontinued.  Given 1 dose of vitamin K.  Started on Megace 40 mg twice daily and will need to send home on Megace.  Keep up follow-up with OB/GYN next week.  AKI, non-anion gap acidosis: Due to prerenal failure.  Treated with IV fluids.  Improved.  Discontinue further IV fluids.  Resume Aldactone and Lasix.  Hypokalemia: Replace.  Deconditioning and debility: Will go home with home health PT.    DVT prophylaxis: Place and maintain sequential compression device Start: 01/07/23 1046   Code Status: DNR with limited intervention Family Communication: Son at the bedside Disposition Plan: Status is: Inpatient Remains inpatient appropriate because: Unsafe discharge plan.     Consultants:  None  Procedures:  None  Antimicrobials:  Rocephin 12/23--     Objective: Vitals:   01/06/23 1409 01/06/23 2218 01/07/23 0602 01/08/23 0632  BP: 129/60 (!) 123/54  132/70  Pulse: 87 84 75 84  Resp: 16 20 20 20   Temp: 98.2 F (36.8 C) 98.1 F (36.7 C) 98.6 F (37 C) 97.8 F (36.6 C)  TempSrc: Oral Oral Oral Oral  SpO2: 100% 100% 99% 95%  Weight:      Height:        Intake/Output Summary (Last 24 hours) at 01/08/2023 1140 Last data filed at 01/08/2023 0544 Gross per 24 hour  Intake 939.47 ml  Output 250 ml  Net 689.47 ml   Ceasar Mons  Weights   01/02/23 2153 01/04/23 0533 01/06/23 0500  Weight: 127.9 kg 108 kg 107.6 kg    Examination:  General exam: Appears calm and comfortable  Chronically sick looking.  She is not in any distress. pale. Pleasant interaction.  Alert awake and oriented.  Generalized weakness but no focal  deficits. Respiratory system: Clear to auscultation. Respiratory effort normal. Cardiovascular system: S1 & S2 heard, RRR.  Gastrointestinal system: Mild diffuse tenderness.  Soft.  Distended.  No rigidity or guarding.  Bowel sound present. Skin: No rashes, lesions or ulcers Psychiatry: Judgement and insight appear normal. Mood & affect appropriate.  Diffusely edematous extremities.  No evidence of redness or erythema.   Data Reviewed: I have personally reviewed following labs and imaging studies  CBC: Recent Labs  Lab 01/02/23 2257 01/03/23 0616 01/05/23 0531 01/06/23 0904 01/07/23 1117 01/07/23 1609 01/08/23 0605  WBC 7.3 5.4 4.7 5.9 4.5  --   --   NEUTROABS 6.2  --   --   --   --   --   --   HGB 9.3* 7.8* 7.7* 8.5* 7.9* 8.0* 7.7*  HCT 29.7* 24.8* 24.6* 28.2* 25.6* 25.3* 24.4*  MCV 95.2 93.2 93.2 94.9 95.5  --   --   PLT 106* 80* 105* 135* 110*  --   --    Basic Metabolic Panel: Recent Labs  Lab 01/03/23 0616 01/03/23 0617 01/05/23 0542 01/06/23 0904 01/07/23 1117 01/08/23 0605  NA 132*  --  132* 137 138 137  K 4.6  --  4.2 4.1 3.9 3.4*  CL 109  --  109 113* 114* 114*  CO2 16*  --  18* 19* 17* 18*  GLUCOSE 82  --  106* 106* 137* 101*  BUN 20  --  22 18 18 17   CREATININE 0.94  --  1.27* 0.98 1.03* 0.90  CALCIUM 7.9*  --  7.7* 8.2* 7.9* 7.9*  MG  --  1.6* 1.9  --   --  1.8  PHOS  --  3.0  --   --   --   --    GFR: Estimated Creatinine Clearance: 71.9 mL/min (by C-G formula based on SCr of 0.9 mg/dL). Liver Function Tests: Recent Labs  Lab 01/02/23 2257 01/03/23 0617 01/05/23 0542  AST 33 25 22  ALT 20 16 17   ALKPHOS 81 62 82  BILITOT 2.3* 2.1* 1.2*  PROT 5.7* 4.7* 5.1*  ALBUMIN 2.9* 2.3* 2.4*   No results for input(s): "LIPASE", "AMYLASE" in the last 168 hours. Recent Labs  Lab 01/02/23 2257 01/05/23 0531  AMMONIA 86* 57*   Coagulation Profile: Recent Labs  Lab 01/02/23 2257 01/04/23 0955  INR 2.2* 2.3*   Cardiac Enzymes: No results for  input(s): "CKTOTAL", "CKMB", "CKMBINDEX", "TROPONINI" in the last 168 hours. BNP (last 3 results) No results for input(s): "PROBNP" in the last 8760 hours. HbA1C: No results for input(s): "HGBA1C" in the last 72 hours. CBG: No results for input(s): "GLUCAP" in the last 168 hours. Lipid Profile: No results for input(s): "CHOL", "HDL", "LDLCALC", "TRIG", "CHOLHDL", "LDLDIRECT" in the last 72 hours. Thyroid Function Tests: No results for input(s): "TSH", "T4TOTAL", "FREET4", "T3FREE", "THYROIDAB" in the last 72 hours. Anemia Panel: No results for input(s): "VITAMINB12", "FOLATE", "FERRITIN", "TIBC", "IRON", "RETICCTPCT" in the last 72 hours. Sepsis Labs: Recent Labs  Lab 01/02/23 2303 01/03/23 0054 01/03/23 0600  LATICACIDVEN 3.8* 2.7* 2.0*    Recent Results (from the past 240 hours)  Blood Culture (routine x 2)  Status: Abnormal   Collection Time: 01/02/23 10:51 PM   Specimen: BLOOD  Result Value Ref Range Status   Specimen Description   Final    BLOOD BLOOD RIGHT ARM Performed at [facility], [address], [place], [place] [address]    Special Requests   Final    BOTTLES DRAWN AEROBIC AND ANAEROBIC Blood Culture results may not be optimal due to an inadequate volume of blood received in culture bottles Performed at [facility], [address] [place], [place] [address]    Culture  Setup Time   Final    GRAM NEGATIVE RODS BOTTLES DRAWN AEROBIC ONLY CRITICAL RESULT CALLED TO, READ BACK BY AND VERIFIED WITH: PHARMD ABBEY ELLINGTON ON 01/03/23 @ 1721 BY DRT Performed at [facility], [address] [place], [place] [address]    Culture KLEBSIELLA PNEUMONIAE (A)  Final   Report Status 01/06/2023 FINAL  Final   Organism ID, Bacteria KLEBSIELLA PNEUMONIAE  Final   Organism ID, Bacteria KLEBSIELLA PNEUMONIAE  Final      Susceptibility   Klebsiella pneumoniae - MIC*    AMPICILLIN >=32 RESISTANT Resistant     CEFEPIME <=0.12  SENSITIVE Sensitive     CEFTAZIDIME <=1 SENSITIVE Sensitive     CEFTRIAXONE <=0.25 SENSITIVE Sensitive     CIPROFLOXACIN <=0.25 SENSITIVE Sensitive     GENTAMICIN <=1 SENSITIVE Sensitive     IMIPENEM <=0.25 SENSITIVE Sensitive     TRIMETH/SULFA <=20 SENSITIVE Sensitive     AMPICILLIN/SULBACTAM >=32 RESISTANT Resistant     PIP/TAZO 32 INTERMEDIATE Intermediate ug/mL   Klebsiella pneumoniae - KIRBY BAUER*    CEFAZOLIN RESISTANT Resistant     * KLEBSIELLA PNEUMONIAE    KLEBSIELLA PNEUMONIAE  Blood Culture ID Panel (Reflexed)     Status: Abnormal   Collection Time: 01/02/23 10:51 PM  Result Value Ref Range Status   Enterococcus faecalis NOT DETECTED NOT DETECTED Final   Enterococcus Faecium NOT DETECTED NOT DETECTED Final   Listeria monocytogenes NOT DETECTED NOT DETECTED Final   Staphylococcus species NOT DETECTED NOT DETECTED Final   Staphylococcus aureus (BCID) NOT DETECTED NOT DETECTED Final   Staphylococcus epidermidis NOT DETECTED NOT DETECTED Final    Comment: NOT DETECTED   Staphylococcus lugdunensis NOT DETECTED NOT DETECTED Final    Comment: NOT DETECTED   Streptococcus species NOT DETECTED NOT DETECTED Final   Streptococcus agalactiae NOT DETECTED NOT DETECTED Final   Streptococcus pneumoniae NOT DETECTED NOT DETECTED Final   Streptococcus pyogenes NOT DETECTED NOT DETECTED Final   A.calcoaceticus-baumannii NOT DETECTED NOT DETECTED Final   Bacteroides fragilis NOT DETECTED NOT DETECTED Final   Enterobacterales DETECTED (A) NOT DETECTED Corrected    Comment: CRITICAL RESULT CALLED TO, READ BACK BY AND VERIFIED WITH: Sophronia Simas PHARMD, AT 5784 01/04/23 C.MORARU CORRECTED ON 12/25 AT 6962: PREVIOUSLY REPORTED AS NOT DETECTED    Enterobacter cloacae complex NOT DETECTED NOT DETECTED Final   Escherichia coli NOT DETECTED NOT DETECTED Final   Klebsiella aerogenes NOT DETECTED NOT DETECTED Final   Klebsiella oxytoca NOT DETECTED NOT DETECTED Final   Klebsiella pneumoniae  DETECTED (A) NOT DETECTED Corrected    Comment: CRITICAL RESULT CALLED TO, READ BACK BY AND VERIFIED WITH: Sophronia Simas PHARMD, AT 9528 01/04/23 C.MORARU CORRECTED ON 12/25 AT 4132: PREVIOUSLY REPORTED AS NOT DETECTED    Proteus species NOT DETECTED NOT DETECTED Final   Salmonella species NOT DETECTED NOT DETECTED Final   Serratia marcescens NOT DETECTED NOT DETECTED Final  Haemophilus influenzae NOT DETECTED NOT DETECTED Final   Neisseria meningitidis NOT DETECTED NOT DETECTED Final   Pseudomonas aeruginosa NOT DETECTED NOT DETECTED Final   Stenotrophomonas maltophilia NOT DETECTED NOT DETECTED Final   Candida albicans NOT DETECTED NOT DETECTED Final   Candida auris NOT DETECTED NOT DETECTED Final   Candida glabrata NOT DETECTED NOT DETECTED Final   Candida krusei NOT DETECTED NOT DETECTED Final   Candida parapsilosis NOT DETECTED NOT DETECTED Final   Candida tropicalis NOT DETECTED NOT DETECTED Final   Cryptococcus neoformans/gattii NOT DETECTED NOT DETECTED Final    Comment: Performed at [facility], [address], [place], [place] [address]  Respiratory (~20 pathogens) panel by PCR     Status: None   Collection Time: 01/03/23  5:11 AM   Specimen: Nasopharyngeal Swab; Respiratory  Result Value Ref Range Status   Adenovirus NOT DETECTED NOT DETECTED Final   Coronavirus 229E NOT DETECTED NOT DETECTED Final    Comment: (NOTE) The Coronavirus on the Respiratory Panel, DOES NOT test for the novel  Coronavirus (2019 nCoV)    Coronavirus HKU1 NOT DETECTED NOT DETECTED Final   Coronavirus NL63 NOT DETECTED NOT DETECTED Final   Coronavirus OC43 NOT DETECTED NOT DETECTED Final   Metapneumovirus NOT DETECTED NOT DETECTED Final   Rhinovirus / Enterovirus NOT DETECTED NOT DETECTED Final   Influenza A NOT DETECTED NOT DETECTED Final   Influenza B NOT DETECTED NOT DETECTED Final   Parainfluenza Virus 1 NOT DETECTED NOT DETECTED Final   Parainfluenza Virus 2 NOT DETECTED NOT DETECTED  Final   Parainfluenza Virus 3 NOT DETECTED NOT DETECTED Final   Parainfluenza Virus 4 NOT DETECTED NOT DETECTED Final   Respiratory Syncytial Virus NOT DETECTED NOT DETECTED Final   Bordetella pertussis NOT DETECTED NOT DETECTED Final   Bordetella Parapertussis NOT DETECTED NOT DETECTED Final   Chlamydophila pneumoniae NOT DETECTED NOT DETECTED Final   Mycoplasma pneumoniae NOT DETECTED NOT DETECTED Final    Comment: Performed at [facility], [address] [place], [place] [address]  Blood Culture (routine x 2)     Status: None   Collection Time: 01/03/23  8:48 AM   Specimen: BLOOD  Result Value Ref Range Status   Specimen Description   Final    BLOOD LEFT ANTECUBITAL Performed at [facility], [address] [place], [place] [address]    Special Requests   Final    BOTTLES DRAWN AEROBIC ONLY Blood Culture results may not be optimal due to an inadequate volume of blood received in culture bottles Performed at [facility], [address], [place], [place] [address]    Culture   Final    NO GROWTH 5 DAYS Performed at [facility], [address], [place], [place] [address]    Report Status 01/08/2023 FINAL  Final         Radiology Studies: No results found.      Scheduled Meds:  ferrous sulfate  325 mg Oral Q breakfast   And   ascorbic acid  250 mg Oral Q breakfast   atorvastatin  40 mg Oral Daily   cyanocobalamin  2,000 mcg Oral Q Fri   famotidine  20 mg Oral Daily   folic acid  1 mg Oral Daily   furosemide  40 mg Oral Daily   lactulose  20 g Oral BID   levothyroxine  150 mcg Oral Q0600   megestrol  40 mg Oral BID  pantoprazole  40 mg Oral BID AC   potassium chloride  40 mEq Oral BID   rifaximin  550 mg Oral BID   rOPINIRole  0.25 mg Oral QHS   sodium chloride flush  3 mL Intravenous Q12H   spironolactone  50 mg Oral Daily   tamsulosin  0.4 mg Oral Daily   thiamine  100 mg Oral Daily   venlafaxine XR  75  mg Oral Daily   Continuous Infusions:  cefTRIAXone (ROCEPHIN)  IV Stopped (01/07/23 1737)     LOS: 5 days    Time spent: 35 minutes    Dorcas Carrow, MD [facility]

## 2023-01-09 DIAGNOSIS — K7682 Hepatic encephalopathy: Secondary | ICD-10-CM | POA: Diagnosis not present

## 2023-01-09 MED ORDER — MIDODRINE HCL 5 MG PO TABS
10.0000 mg | ORAL_TABLET | Freq: Three times a day (TID) | ORAL | Status: DC
  Administered 2023-01-09 – 2023-01-10 (×4): 10 mg via ORAL
  Filled 2023-01-09 (×4): qty 2

## 2023-01-09 NOTE — Progress Notes (Signed)
Physical Therapy Treatment Patient Details Name: [name] MRN: [id number] DOB: June 16, 1955 Today's Date: 01/09/2023   History of Present Illness 67 y.o. female was brought into the emergency department due to concern for mild worsening confusion in addition to leg pain/swelling on 01/06/23. Dx of hepatic encephalopathy, RLE cellulitis. 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    PT Comments   Pt admitted with above diagnosis.  Pt currently with functional limitations due to the deficits listed below (see PT Problem List). Pt in bed when PT arrived. Son present and indicated recent changes in medication and concern per recent lab findings as well as stating pt was dizzy with transfer tasks yesterday. Pt agreeable to therapy intervention. PT able to assess Bp with mobility tasks, please see below. Pt required S and increased time with use of hospital bed for supine to sit, min A for sit to stand  from EOB with pull to stand at RW, CGA for scooting to the L toward [facility] and CGA for sit to supine with pt able to reposition in bed with cues. Pt left in bed all needs in place. PT able to communicate with MD per Bp findings. Pt will benefit from acute skilled PT to increase their independence and safety with mobility to allow discharge.   Bp semi reclined at rest in bed 94/47 (77 PR) Bp seated EOB 99/66 (84 PR) Bp standing 90/47 (87 PR) pt limited in standing to 1:48 and unable to complete assessment for orthostatics   If plan is discharge home, recommend the following: A little help with walking and/or transfers;A little help with bathing/dressing/bathroom;Assistance with cooking/housework;Assist for  transportation;Help with stairs or ramp for entrance   Can travel by private vehicle        Equipment Recommendations  None recommended by PT    Recommendations for Other Services       Precautions / Restrictions Precautions Precautions: Fall Precaution Comments: denies falls in past 6 months Restrictions Weight Bearing Restrictions Per Provider Order: No     Mobility  Bed Mobility Overal bed mobility: Needs Assistance Bed Mobility: Supine to Sit, Sit to Supine     Supine to sit: Supervision, HOB elevated, Used rails Sit to supine: Contact guard assist   General bed mobility comments: min cues and increased time, pt able to scoot toward [facility] with a squat pivot like movement with CGA    Transfers Overall transfer level: Needs assistance Equipment used: Rolling walker (2 wheels) Transfers: Sit to/from Stand Sit to Stand: Min assist           General transfer comment: min cues and pt reporting dizziness    Ambulation/Gait               General Gait Details: NT due to pt reported symptomatic orthostatic hypotension   Stairs             Wheelchair Mobility     Tilt Bed    Modified Rankin (Stroke Patients Only)       Balance Overall balance assessment: Needs assistance Sitting-balance support: No upper extremity supported, Feet supported Sitting balance-Leahy Scale: Good     Standing balance support: Bilateral upper extremity supported, Reliant on assistive device for balance Standing balance-Leahy Scale: Poor Standing balance comment: B UE support at [facility]  Cognition Arousal: Alert Behavior During Therapy: WFL for tasks assessed/performed Overall Cognitive Status: Within Functional Limits for tasks assessed                                          Exercises      General Comments General comments (skin integrity, edema, etc.): c/o dizziness, noted B LE edema slight rubor  posterior R distal LE, breasts edemous and rubor      Pertinent Vitals/Pain Pain Assessment Pain Assessment: No/denies pain    Home Living                          Prior Function            PT Goals (current goals can now be found in the care plan section) Acute Rehab PT Goals Patient Stated Goal: go to the beach wtih son PT Goal Formulation: With patient Time For Goal Achievement: 01/18/23 Potential to Achieve Goals: Good Progress towards PT goals:  (limited progression today due to symptomatic orthostatic hypotension)    Frequency    Min 1X/week      PT Plan      Co-evaluation              AM-PAC PT "6 Clicks" Mobility   Outcome Measure  Help needed turning from your back to your side while in a flat bed without using bedrails?: None Help needed moving from lying on your back to sitting on the side of a flat bed without using bedrails?: None Help needed moving to and from a bed to a chair (including a wheelchair)?: A Little Help needed standing up from a chair using your arms (e.g., wheelchair or bedside chair)?: A Little Help needed to walk in hospital room?: A Little Help needed climbing 3-5 steps with a railing? : A Lot 6 Click Score: 19    End of Session Equipment Utilized During Treatment: Gait belt Activity Tolerance: Patient limited by fatigue;Other (comment) (Bp findings) Patient left: in bed;with call bell/phone within reach;with family/visitor present Nurse Communication: Mobility status;Other (comment) (Bp findings) PT Visit Diagnosis: Muscle weakness (generalized) (M62.81);Difficulty in walking, not elsewhere classified (R26.2);Pain     Time: 7829-5621 PT Time Calculation (min) (ACUTE ONLY): 27 min  Charges:    $Therapeutic Activity: 23-37 mins PT General Charges $$ ACUTE PT VISIT: 1 Visit                     [name], PT Acute Rehab    [name] 01/09/2023, 11:22 AM

## 2023-01-09 NOTE — Progress Notes (Signed)
PROGRESS NOTE    [name]  WUJ:[id number] DOB: May 04, 1955 DOA: 01/02/2023 PCP: [name], MD    Brief Narrative:  67 year old with decompensated Nash cirrhosis and recurrent hepatic encephalopathy, hypotension on midodrine, PE and DVT now off anticoagulation, short gut syndrome, hypothyroidism, obesity, MDRO infection, mood disorder, left eye blindness, recent encephalopathy presented back to the hospital with worsening confusion in addition to leg pain and swelling.  She was taking lactulose and already was having 4-6 bowel movements a day.  Lives at home with her son.  Admitted with hepatic encephalopathy.  Subjective:  Patient seen and examined.  Vaginal bleeding has improved and mostly gone.  Today, physical therapist attempted to walk her.  She has orthostatic drop in systolic blood pressure less than 90 and could not stand more than 2 minutes.  Apparently, she was not getting her midodrine in the hospital.  Assessment & Plan:   Recurrent hepatic encephalopathy, ascites and coagulopathy.  Decompensated nonalcoholic hepatic cirrhosis: Likely exacerbated by dehydration, infection and use of narcotics. Patient had recently undergone EGD with no esophageal varices. Mental status improved after treatment with lactulose and rifaximin.  Ammonia has trended down. Continue lactulose to make at least 3-4 bowel movements a day. Continue rifaximin. Outpatient follow-up with GI.  Suspected right lower extremity cellulitis, bilateral lower extremity edema, moderate aortic stenosis and diastolic heart failure.  Proteus bacteremia. Patient was treated for cellulitis on top of her chronic edema.  She is receiving IV Rocephin. Blood cultures on admission positive for Klebsiella pneumonia.  Rocephin day 7 today.  Will continue ciprofloxacin on discharge to complete 10 days of therapy.  Clinically improving.  Dysfunctional uterine bleeding: Related to fibroids and also coagulopathic with  liver failure.  Previously seen by OB/GYN.  She has an appointment with OB/GYN next week.  Previously responded to Megace. Lovenox discontinued.  Given 1 dose of vitamin K.  Started on Megace 40 mg twice daily and will need to send home on Megace.  Keep up follow-up with OB/GYN next week.  Clinically improving.  AKI, non-anion gap acidosis: Due to prerenal failure.  Treated with IV fluids.  Improved.  Discontinue further IV fluids.  Resume Aldactone and Lasix.  Hypokalemia: Replace.  Deconditioning and debility: Will go home with home health PT.  Orthostatic hypotension: Probably due to third spacing and deconditioning. At home on midodrine 10 mg 3 times daily, resumed today.  Continue to attempt mobility to prepare for discharge.    DVT prophylaxis: Place and maintain sequential compression device Start: 01/07/23 1046   Code Status: DNR with limited intervention Family Communication: Son at the bedside Disposition Plan: Status is: Inpatient Remains inpatient appropriate because: Significant orthostatic symptoms.     Consultants:  None  Procedures:  None  Antimicrobials:  Rocephin 12/23--     Objective: Vitals:   01/08/23 0632 01/08/23 1300 01/08/23 2100 01/09/23 0521  BP: 132/70 (!) 100/55 129/62 129/68  Pulse: 84 64 65 79  Resp: 20 15 17 18   Temp: 97.8 F (36.6 C) 98.5 F (36.9 C) 98.3 F (36.8 C) 98.8 F (37.1 C)  TempSrc: Oral Axillary Oral Oral  SpO2: 95% 100% 100% 97%  Weight:      Height:        Intake/Output Summary (Last 24 hours) at 01/09/2023 1125 Last data filed at 01/09/2023 1047 Gross per 24 hour  Intake 820 ml  Output 1100 ml  Net -280 ml   Filed Weights   01/02/23 2153 01/04/23 0533 01/06/23 0500  Weight: 127.9 kg 108 kg 107.6 kg    Examination:  General exam: Appears calm and comfortable at rest.  Slightly anxious today after mobilizing. Chronically sick looking.  Frail and debilitated. Alert awake and oriented.  Generalized  weakness but no focal deficits. Respiratory system: Clear to auscultation. Respiratory effort normal. Cardiovascular system: S1 & S2 heard, RRR.  Gastrointestinal system: Mild diffuse tenderness.  Soft.  Distended.  No rigidity or guarding.  Bowel sound present. Skin: No rashes, lesions or ulcers Psychiatry: Judgement and insight appear normal. Mood & affect appropriate.  Diffusely edematous extremities.  No evidence of redness or erythema.   Data Reviewed: I have personally reviewed following labs and imaging studies  CBC: Recent Labs  Lab 01/02/23 2257 01/03/23 0616 01/05/23 0531 01/06/23 0904 01/07/23 1117 01/07/23 1609 01/08/23 0605  WBC 7.3 5.4 4.7 5.9 4.5  --   --   NEUTROABS 6.2  --   --   --   --   --   --   HGB 9.3* 7.8* 7.7* 8.5* 7.9* 8.0* 7.7*  HCT 29.7* 24.8* 24.6* 28.2* 25.6* 25.3* 24.4*  MCV 95.2 93.2 93.2 94.9 95.5  --   --   PLT 106* 80* 105* 135* 110*  --   --    Basic Metabolic Panel: Recent Labs  Lab 01/03/23 0616 01/03/23 0617 01/05/23 0542 01/06/23 0904 01/07/23 1117 01/08/23 0605  NA 132*  --  132* 137 138 137  K 4.6  --  4.2 4.1 3.9 3.4*  CL 109  --  109 113* 114* 114*  CO2 16*  --  18* 19* 17* 18*  GLUCOSE 82  --  106* 106* 137* 101*  BUN 20  --  22 18 18 17   CREATININE 0.94  --  1.27* 0.98 1.03* 0.90  CALCIUM 7.9*  --  7.7* 8.2* 7.9* 7.9*  MG  --  1.6* 1.9  --   --  1.8  PHOS  --  3.0  --   --   --   --    GFR: Estimated Creatinine Clearance: 71.9 mL/min (by C-G formula based on SCr of 0.9 mg/dL). Liver Function Tests: Recent Labs  Lab 01/02/23 2257 01/03/23 0617 01/05/23 0542  AST 33 25 22  ALT 20 16 17   ALKPHOS 81 62 82  BILITOT 2.3* 2.1* 1.2*  PROT 5.7* 4.7* 5.1*  ALBUMIN 2.9* 2.3* 2.4*   No results for input(s): "LIPASE", "AMYLASE" in the last 168 hours. Recent Labs  Lab 01/02/23 2257 01/05/23 0531  AMMONIA 86* 57*   Coagulation Profile: Recent Labs  Lab 01/02/23 2257 01/04/23 0955  INR 2.2* 2.3*   Cardiac  Enzymes: No results for input(s): "CKTOTAL", "CKMB", "CKMBINDEX", "TROPONINI" in the last 168 hours. BNP (last 3 results) No results for input(s): "PROBNP" in the last 8760 hours. HbA1C: No results for input(s): "HGBA1C" in the last 72 hours. CBG: No results for input(s): "GLUCAP" in the last 168 hours. Lipid Profile: No results for input(s): "CHOL", "HDL", "LDLCALC", "TRIG", "CHOLHDL", "LDLDIRECT" in the last 72 hours. Thyroid Function Tests: No results for input(s): "TSH", "T4TOTAL", "FREET4", "T3FREE", "THYROIDAB" in the last 72 hours. Anemia Panel: No results for input(s): "VITAMINB12", "FOLATE", "FERRITIN", "TIBC", "IRON", "RETICCTPCT" in the last 72 hours. Sepsis Labs: Recent Labs  Lab 01/02/23 2303 01/03/23 0054 01/03/23 0600  LATICACIDVEN 3.8* 2.7* 2.0*    Recent Results (from the past 240 hours)  Blood Culture (routine x 2)     Status: Abnormal   Collection Time: 01/02/23  10:51 PM   Specimen: BLOOD  Result Value Ref Range Status   Specimen Description   Final    BLOOD BLOOD RIGHT ARM Performed at [facility], [address] [place], [place] [address]    Special Requests   Final    BOTTLES DRAWN AEROBIC AND ANAEROBIC Blood Culture results may not be optimal due to an inadequate volume of blood received in culture bottles Performed at [facility], [address] [place], [place] [address]    Culture  Setup Time   Final    GRAM NEGATIVE RODS BOTTLES DRAWN AEROBIC ONLY CRITICAL RESULT CALLED TO, READ BACK BY AND VERIFIED WITH: PHARMD ABBEY ELLINGTON ON 01/03/23 @ 1721 BY DRT Performed at [facility], [address], [place], [place] [address]    Culture KLEBSIELLA PNEUMONIAE (A)  Final   Report Status 01/06/2023 FINAL  Final   Organism ID, Bacteria KLEBSIELLA PNEUMONIAE  Final   Organism ID, Bacteria KLEBSIELLA PNEUMONIAE  Final      Susceptibility   Klebsiella pneumoniae - MIC*    AMPICILLIN >=32 RESISTANT  Resistant     CEFEPIME <=0.12 SENSITIVE Sensitive     CEFTAZIDIME <=1 SENSITIVE Sensitive     CEFTRIAXONE <=0.25 SENSITIVE Sensitive     CIPROFLOXACIN <=0.25 SENSITIVE Sensitive     GENTAMICIN <=1 SENSITIVE Sensitive     IMIPENEM <=0.25 SENSITIVE Sensitive     TRIMETH/SULFA <=20 SENSITIVE Sensitive     AMPICILLIN/SULBACTAM >=32 RESISTANT Resistant     PIP/TAZO 32 INTERMEDIATE Intermediate ug/mL   Klebsiella pneumoniae - KIRBY BAUER*    CEFAZOLIN RESISTANT Resistant     * KLEBSIELLA PNEUMONIAE    KLEBSIELLA PNEUMONIAE  Blood Culture ID Panel (Reflexed)     Status: Abnormal   Collection Time: 01/02/23 10:51 PM  Result Value Ref Range Status   Enterococcus faecalis NOT DETECTED NOT DETECTED Final   Enterococcus Faecium NOT DETECTED NOT DETECTED Final   Listeria monocytogenes NOT DETECTED NOT DETECTED Final   Staphylococcus species NOT DETECTED NOT DETECTED Final   Staphylococcus aureus (BCID) NOT DETECTED NOT DETECTED Final   Staphylococcus epidermidis NOT DETECTED NOT DETECTED Final    Comment: NOT DETECTED   Staphylococcus lugdunensis NOT DETECTED NOT DETECTED Final    Comment: NOT DETECTED   Streptococcus species NOT DETECTED NOT DETECTED Final   Streptococcus agalactiae NOT DETECTED NOT DETECTED Final   Streptococcus pneumoniae NOT DETECTED NOT DETECTED Final   Streptococcus pyogenes NOT DETECTED NOT DETECTED Final   A.calcoaceticus-baumannii NOT DETECTED NOT DETECTED Final   Bacteroides fragilis NOT DETECTED NOT DETECTED Final   Enterobacterales DETECTED (A) NOT DETECTED Corrected    Comment: CRITICAL RESULT CALLED TO, READ BACK BY AND VERIFIED WITH: Sophronia Simas PHARMD, AT 9147 01/04/23 C.MORARU CORRECTED ON 12/25 AT 8295: PREVIOUSLY REPORTED AS NOT DETECTED    Enterobacter cloacae complex NOT DETECTED NOT DETECTED Final   Escherichia coli NOT DETECTED NOT DETECTED Final   Klebsiella aerogenes NOT DETECTED NOT DETECTED Final   Klebsiella oxytoca NOT DETECTED NOT DETECTED Final    Klebsiella pneumoniae DETECTED (A) NOT DETECTED Corrected    Comment: CRITICAL RESULT CALLED TO, READ BACK BY AND VERIFIED WITH: Sophronia Simas PHARMD, AT 6213 01/04/23 C.MORARU CORRECTED ON 12/25 AT 0865: PREVIOUSLY REPORTED AS NOT DETECTED    Proteus species NOT DETECTED NOT DETECTED Final   Salmonella species NOT DETECTED NOT DETECTED Final   Serratia marcescens NOT DETECTED NOT DETECTED Final   Haemophilus influenzae NOT DETECTED NOT DETECTED Final  Neisseria meningitidis NOT DETECTED NOT DETECTED Final   Pseudomonas aeruginosa NOT DETECTED NOT DETECTED Final   Stenotrophomonas maltophilia NOT DETECTED NOT DETECTED Final   Candida albicans NOT DETECTED NOT DETECTED Final   Candida auris NOT DETECTED NOT DETECTED Final   Candida glabrata NOT DETECTED NOT DETECTED Final   Candida krusei NOT DETECTED NOT DETECTED Final   Candida parapsilosis NOT DETECTED NOT DETECTED Final   Candida tropicalis NOT DETECTED NOT DETECTED Final   Cryptococcus neoformans/gattii NOT DETECTED NOT DETECTED Final    Comment: Performed at [facility], [address], [place], [place] [address]  Respiratory (~20 pathogens) panel by PCR     Status: None   Collection Time: 01/03/23  5:11 AM   Specimen: Nasopharyngeal Swab; Respiratory  Result Value Ref Range Status   Adenovirus NOT DETECTED NOT DETECTED Final   Coronavirus 229E NOT DETECTED NOT DETECTED Final    Comment: (NOTE) The Coronavirus on the Respiratory Panel, DOES NOT test for the novel  Coronavirus (2019 nCoV)    Coronavirus HKU1 NOT DETECTED NOT DETECTED Final   Coronavirus NL63 NOT DETECTED NOT DETECTED Final   Coronavirus OC43 NOT DETECTED NOT DETECTED Final   Metapneumovirus NOT DETECTED NOT DETECTED Final   Rhinovirus / Enterovirus NOT DETECTED NOT DETECTED Final   Influenza A NOT DETECTED NOT DETECTED Final   Influenza B NOT DETECTED NOT DETECTED Final   Parainfluenza Virus 1 NOT DETECTED NOT DETECTED Final   Parainfluenza Virus 2 NOT  DETECTED NOT DETECTED Final   Parainfluenza Virus 3 NOT DETECTED NOT DETECTED Final   Parainfluenza Virus 4 NOT DETECTED NOT DETECTED Final   Respiratory Syncytial Virus NOT DETECTED NOT DETECTED Final   Bordetella pertussis NOT DETECTED NOT DETECTED Final   Bordetella Parapertussis NOT DETECTED NOT DETECTED Final   Chlamydophila pneumoniae NOT DETECTED NOT DETECTED Final   Mycoplasma pneumoniae NOT DETECTED NOT DETECTED Final    Comment: Performed at [facility], [address] [place], [place] [address]  Blood Culture (routine x 2)     Status: None   Collection Time: 01/03/23  8:48 AM   Specimen: BLOOD  Result Value Ref Range Status   Specimen Description   Final    BLOOD LEFT ANTECUBITAL Performed at [facility], [address] [place], [place] [address]    Special Requests   Final    BOTTLES DRAWN AEROBIC ONLY Blood Culture results may not be optimal due to an inadequate volume of blood received in culture bottles Performed at [facility], [address], [place], [place] [address]    Culture   Final    NO GROWTH 5 DAYS Performed at [facility], [address] [place], [place] [address]    Report Status 01/08/2023 FINAL  Final         Radiology Studies: No results found.      Scheduled Meds:  ferrous sulfate  325 mg Oral Q breakfast   And   ascorbic acid  250 mg Oral Q breakfast   atorvastatin  40 mg Oral Daily   cyanocobalamin  2,000 mcg Oral Q Fri   famotidine  20 mg Oral Daily   folic acid  1 mg Oral Daily   furosemide  40 mg Oral Daily   lactulose  20 g Oral BID   levothyroxine  150 mcg Oral Q0600   megestrol  40 mg Oral BID   midodrine  10 mg Oral TID WC  pantoprazole  40 mg Oral BID AC   potassium chloride  40 mEq Oral BID   rifaximin  550 mg Oral BID   rOPINIRole  0.25 mg Oral QHS   sodium chloride flush  3 mL Intravenous Q12H   spironolactone  50 mg Oral Daily   tamsulosin  0.4 mg Oral  Daily   thiamine  100 mg Oral Daily   venlafaxine XR  75 mg Oral Daily   Continuous Infusions:  cefTRIAXone (ROCEPHIN)  IV Stopped (01/08/23 1858)     LOS: 6 days    Time spent: 35 minutes    Dorcas Carrow, MD [facility]

## 2023-01-10 DIAGNOSIS — K7682 Hepatic encephalopathy: Secondary | ICD-10-CM | POA: Diagnosis not present

## 2023-01-10 MED ORDER — SODIUM CHLORIDE 0.9 % IV SOLN
2.0000 g | INTRAVENOUS | Status: AC
  Administered 2023-01-11: 2 g via INTRAVENOUS
  Filled 2023-01-10: qty 20

## 2023-01-10 MED ORDER — MIDODRINE HCL 5 MG PO TABS
15.0000 mg | ORAL_TABLET | Freq: Three times a day (TID) | ORAL | Status: DC
  Administered 2023-01-10 – 2023-01-13 (×9): 15 mg via ORAL
  Filled 2023-01-10 (×9): qty 3

## 2023-01-10 MED ORDER — SODIUM CHLORIDE 0.9 % IV SOLN
2.0000 g | INTRAVENOUS | Status: AC
  Administered 2023-01-10: 2 g via INTRAVENOUS
  Filled 2023-01-10: qty 20

## 2023-01-10 NOTE — Progress Notes (Signed)
 PROGRESS NOTE    [name]  FMW:[id number] DOB: May 07, 1955 DOA: 01/02/2023 PCP: [name], MD    Brief Narrative:  67 year old with decompensated Nash cirrhosis and recurrent hepatic encephalopathy, hypotension on midodrine , PE and DVT now off anticoagulation, short gut syndrome, hypothyroidism, obesity, MDRO infection, mood disorder, left eye blindness, recent encephalopathy presented back to the hospital with worsening confusion in addition to leg pain and swelling.  She was taking lactulose  and already was having 4-6 bowel movements a day.  Lives at home with her son.  Admitted with hepatic encephalopathy.  Subjective:  Patient seen and examined.  Overall stable.  Still significant drop in orthostatic. She tried to work with clinical research associate, inconsistent blood pressures because of anasarca, however patient became dizzy and lightheaded on standing for 3 minutes. Will increase dose of midodrine  to 15 mg 3 times daily and monitor.  Assessment & Plan:   Recurrent hepatic encephalopathy, ascites and coagulopathy.  Decompensated nonalcoholic hepatic cirrhosis: Likely exacerbated by dehydration, infection and use of narcotics. Patient had recently undergone EGD with no esophageal varices. Mental status improved after treatment with lactulose  and rifaximin .  Ammonia has trended down. Continue lactulose  to make at least 3-4 bowel movements a day. Continue rifaximin . Outpatient follow-up with GI.  Suspected right lower extremity cellulitis, bilateral lower extremity edema, moderate aortic stenosis and diastolic heart failure.  Proteus bacteremia. Patient was treated for cellulitis on top of her chronic edema.  She is receiving IV Rocephin . Blood cultures on admission positive for Klebsiella pneumonia.   Patient is on day 9 of Rocephin  today.  As she is staying in the hospital, she will be given 1 more dose of ciprofloxacin  tomorrow morning.  Does not need further antibiotics on  discharge.  Clinically improving.    Dysfunctional uterine bleeding: Related to fibroids and also coagulopathic with liver failure.  Previously seen by OB/GYN.  She has an appointment with OB/GYN next week.  Previously responded to Megace . Lovenox  discontinued.  Given 1 dose of vitamin K .  Started on Megace  40 mg twice daily and will need to send home on Megace .  Keep up follow-up with OB/GYN next week.  Clinically improving.  AKI, non-anion gap acidosis: Due to prerenal failure.  Treated with IV fluids.  Improved.  Discontinue further IV fluids.  Resume Aldactone  and Lasix .  Hypokalemia: Replaced.  Deconditioning and debility: Will go home with home health PT.  Orthostatic hypotension: Probably due to third spacing and deconditioning.  Significant symptoms. At home on midodrine  10 mg 3 times daily, still orthostatic.   Increase dose of midodrine .  Continue to attempt mobility to prepare for discharge. We have to continue her diuretics otherwise she gets bloated, it is challenging to continue diuretics with orthostatic symptoms, however it is necessary.    DVT prophylaxis: Place and maintain sequential compression device Start: 01/07/23 1046   Code Status: DNR with limited intervention Family Communication: Son at the bedside Disposition Plan: Status is: Inpatient Remains inpatient appropriate because: Significant orthostatic symptoms.     Consultants:  None  Procedures:  None  Antimicrobials:  Rocephin  12/23--     Objective: Vitals:   01/09/23 1539 01/09/23 2031 01/10/23 0510 01/10/23 1312  BP: (!) 106/54 (!) 107/52 138/66 (!) 110/57  Pulse: 79 74 74 76  Resp:  15 16   Temp:  98.4 F (36.9 C) 98.1 F (36.7 C)   TempSrc:  Oral Oral   SpO2: 95% 98% 99% 98%  Weight:      Height:  Intake/Output Summary (Last 24 hours) at 01/10/2023 1456 Last data filed at 01/10/2023 0600 Gross per 24 hour  Intake 240 ml  Output 400 ml  Net -160 ml   Filed Weights    01/02/23 2153 01/04/23 0533 01/06/23 0500  Weight: 127.9 kg 108 kg 107.6 kg    Examination:  General exam: Appears calm and comfortable at rest.  Very debilitated. Respiratory system: Clear to auscultation. Respiratory effort normal. Cardiovascular system: S1 & S2 heard, RRR.  Gastrointestinal system: Mild diffuse tenderness.  Soft.  Distended.  No rigidity or guarding.  Bowel sound present. Skin: No rashes, lesions or ulcers Psychiatry: Judgement and insight appear normal. Mood & affect appropriate.  Diffusely edematous extremities.  No evidence of redness or erythema.   Data Reviewed: I have personally reviewed following labs and imaging studies  CBC: Recent Labs  Lab 01/05/23 0531 01/06/23 0904 01/07/23 1117 01/07/23 1609 01/08/23 0605  WBC 4.7 5.9 4.5  --   --   HGB 7.7* 8.5* 7.9* 8.0* 7.7*  HCT 24.6* 28.2* 25.6* 25.3* 24.4*  MCV 93.2 94.9 95.5  --   --   PLT 105* 135* 110*  --   --    Basic Metabolic Panel: Recent Labs  Lab 01/05/23 0542 01/06/23 0904 01/07/23 1117 01/08/23 0605  NA 132* 137 138 137  K 4.2 4.1 3.9 3.4*  CL 109 113* 114* 114*  CO2 18* 19* 17* 18*  GLUCOSE 106* 106* 137* 101*  BUN 22 18 18 17   CREATININE 1.27* 0.98 1.03* 0.90  CALCIUM  7.7* 8.2* 7.9* 7.9*  MG 1.9  --   --  1.8   GFR: Estimated Creatinine Clearance: 71.9 mL/min (by C-G formula based on SCr of 0.9 mg/dL). Liver Function Tests: Recent Labs  Lab 01/05/23 0542  AST 22  ALT 17  ALKPHOS 82  BILITOT 1.2*  PROT 5.1*  ALBUMIN  2.4*   No results for input(s): LIPASE, AMYLASE in the last 168 hours. Recent Labs  Lab 01/05/23 0531  AMMONIA 57*   Coagulation Profile: Recent Labs  Lab 01/04/23 0955  INR 2.3*   Cardiac Enzymes: No results for input(s): CKTOTAL, CKMB, CKMBINDEX, TROPONINI in the last 168 hours. BNP (last 3 results) No results for input(s): PROBNP in the last 8760 hours. HbA1C: No results for input(s): HGBA1C in the last 72 hours. CBG: No  results for input(s): GLUCAP in the last 168 hours. Lipid Profile: No results for input(s): CHOL, HDL, LDLCALC, TRIG, CHOLHDL, LDLDIRECT in the last 72 hours. Thyroid  Function Tests: No results for input(s): TSH, T4TOTAL, FREET4, T3FREE, THYROIDAB in the last 72 hours. Anemia Panel: No results for input(s): VITAMINB12, FOLATE, FERRITIN, TIBC, IRON , RETICCTPCT in the last 72 hours. Sepsis Labs: No results for input(s): PROCALCITON, LATICACIDVEN in the last 168 hours.   Recent Results (from the past 240 hours)  Blood Culture (routine x 2)     Status: Abnormal   Collection Time: 01/02/23 10:51 PM   Specimen: BLOOD  Result Value Ref Range Status   Specimen Description   Final    BLOOD BLOOD RIGHT ARM Performed at [facility], [address] [place], [place] [address]    Special Requests   Final    BOTTLES DRAWN AEROBIC AND ANAEROBIC Blood Culture results may not be optimal due to an inadequate volume of blood received in culture bottles Performed at [facility], [address] [place], [place] [address]    Culture  Setup Time   Final  GRAM NEGATIVE RODS BOTTLES DRAWN AEROBIC ONLY CRITICAL RESULT CALLED TO, READ BACK BY AND VERIFIED WITH: PHARMD ABBEY ELLINGTON ON 01/03/23 @ 1721 BY DRT Performed at [facility], [address], [place], [place] [address]    Culture KLEBSIELLA PNEUMONIAE (A)  Final   Report Status 01/06/2023 FINAL  Final   Organism ID, Bacteria KLEBSIELLA PNEUMONIAE  Final   Organism ID, Bacteria KLEBSIELLA PNEUMONIAE  Final      Susceptibility   Klebsiella pneumoniae - MIC*    AMPICILLIN >=32 RESISTANT Resistant     CEFEPIME  <=0.12 SENSITIVE Sensitive     CEFTAZIDIME <=1 SENSITIVE Sensitive     CEFTRIAXONE  <=0.25 SENSITIVE Sensitive     CIPROFLOXACIN  <=0.25 SENSITIVE Sensitive     GENTAMICIN <=1 SENSITIVE Sensitive     IMIPENEM <=0.25 SENSITIVE Sensitive     TRIMETH /SULFA   <=20 SENSITIVE Sensitive     AMPICILLIN/SULBACTAM >=32 RESISTANT Resistant     PIP/TAZO 32 INTERMEDIATE Intermediate ug/mL   Klebsiella pneumoniae - KIRBY BAUER*    CEFAZOLIN  RESISTANT Resistant     * KLEBSIELLA PNEUMONIAE    KLEBSIELLA PNEUMONIAE  Blood Culture ID Panel (Reflexed)     Status: Abnormal   Collection Time: 01/02/23 10:51 PM  Result Value Ref Range Status   Enterococcus faecalis NOT DETECTED NOT DETECTED Final   Enterococcus Faecium NOT DETECTED NOT DETECTED Final   Listeria monocytogenes NOT DETECTED NOT DETECTED Final   Staphylococcus species NOT DETECTED NOT DETECTED Final   Staphylococcus aureus (BCID) NOT DETECTED NOT DETECTED Final   Staphylococcus epidermidis NOT DETECTED NOT DETECTED Final    Comment: NOT DETECTED   Staphylococcus lugdunensis NOT DETECTED NOT DETECTED Final    Comment: NOT DETECTED   Streptococcus species NOT DETECTED NOT DETECTED Final   Streptococcus agalactiae NOT DETECTED NOT DETECTED Final   Streptococcus pneumoniae NOT DETECTED NOT DETECTED Final   Streptococcus pyogenes NOT DETECTED NOT DETECTED Final   A.calcoaceticus-baumannii NOT DETECTED NOT DETECTED Final   Bacteroides fragilis NOT DETECTED NOT DETECTED Final   Enterobacterales DETECTED (A) NOT DETECTED Corrected    Comment: CRITICAL RESULT CALLED TO, READ BACK BY AND VERIFIED WITH: EMERSON EDISON PHARMD, AT 9091 01/04/23 C.MORARU CORRECTED ON 12/25 AT 9090: PREVIOUSLY REPORTED AS NOT DETECTED    Enterobacter cloacae complex NOT DETECTED NOT DETECTED Final   Escherichia coli NOT DETECTED NOT DETECTED Final   Klebsiella aerogenes NOT DETECTED NOT DETECTED Final   Klebsiella oxytoca NOT DETECTED NOT DETECTED Final   Klebsiella pneumoniae DETECTED (A) NOT DETECTED Corrected    Comment: CRITICAL RESULT CALLED TO, READ BACK BY AND VERIFIED WITH: EMERSON EDISON PHARMD, AT 9091 01/04/23 C.MORARU CORRECTED ON 12/25 AT 0909: PREVIOUSLY REPORTED AS NOT DETECTED    Proteus species NOT DETECTED NOT  DETECTED Final   Salmonella species NOT DETECTED NOT DETECTED Final   Serratia marcescens NOT DETECTED NOT DETECTED Final   Haemophilus influenzae NOT DETECTED NOT DETECTED Final   Neisseria meningitidis NOT DETECTED NOT DETECTED Final   Pseudomonas aeruginosa NOT DETECTED NOT DETECTED Final   Stenotrophomonas maltophilia NOT DETECTED NOT DETECTED Final   Candida albicans NOT DETECTED NOT DETECTED Final   Candida auris NOT DETECTED NOT DETECTED Final   Candida glabrata NOT DETECTED NOT DETECTED Final   Candida krusei NOT DETECTED NOT DETECTED Final   Candida parapsilosis NOT DETECTED NOT DETECTED Final   Candida tropicalis NOT DETECTED NOT DETECTED Final   Cryptococcus neoformans/gattii NOT DETECTED NOT DETECTED Final    Comment: Performed at [facility], [address], [place], [place] [address]  Respiratory (~20 pathogens) panel by PCR     Status: None   Collection Time: 01/03/23  5:11 AM   Specimen: Nasopharyngeal Swab; Respiratory  Result Value Ref Range Status   Adenovirus NOT DETECTED NOT DETECTED Final   Coronavirus 229E NOT DETECTED NOT DETECTED Final    Comment: (NOTE) The Coronavirus on the Respiratory Panel, DOES NOT test for the novel  Coronavirus (2019 nCoV)    Coronavirus HKU1 NOT DETECTED NOT DETECTED Final   Coronavirus NL63 NOT DETECTED NOT DETECTED Final   Coronavirus OC43 NOT DETECTED NOT DETECTED Final   Metapneumovirus NOT DETECTED NOT DETECTED Final   Rhinovirus / Enterovirus NOT DETECTED NOT DETECTED Final   Influenza A NOT DETECTED NOT DETECTED Final   Influenza B NOT DETECTED NOT DETECTED Final   Parainfluenza Virus 1 NOT DETECTED NOT DETECTED Final   Parainfluenza Virus 2 NOT DETECTED NOT DETECTED Final   Parainfluenza Virus 3 NOT DETECTED NOT DETECTED Final   Parainfluenza Virus 4 NOT DETECTED NOT DETECTED Final   Respiratory Syncytial Virus NOT DETECTED NOT DETECTED Final   Bordetella pertussis NOT DETECTED NOT DETECTED Final   Bordetella  Parapertussis NOT DETECTED NOT DETECTED Final   Chlamydophila pneumoniae NOT DETECTED NOT DETECTED Final   Mycoplasma pneumoniae NOT DETECTED NOT DETECTED Final    Comment: Performed at [facility], [address], [place], [place] [address]  Blood Culture (routine x 2)     Status: None   Collection Time: 01/03/23  8:48 AM   Specimen: BLOOD  Result Value Ref Range Status   Specimen Description   Final    BLOOD LEFT ANTECUBITAL Performed at [facility], [address] [place], [place] [address]    Special Requests   Final    BOTTLES DRAWN AEROBIC ONLY Blood Culture results may not be optimal due to an inadequate volume of blood received in culture bottles Performed at [facility], [address] [place], [place] [address]    Culture   Final    NO GROWTH 5 DAYS Performed at [facility], [address] [place], [place] [address]    Report Status 01/08/2023 FINAL  Final         Radiology Studies: No results found.      Scheduled Meds:  ferrous sulfate   325 mg Oral Q breakfast   And   ascorbic acid   250 mg Oral Q breakfast   atorvastatin   40 mg Oral Daily   cyanocobalamin   2,000 mcg Oral Q Fri   famotidine   20 mg Oral Daily   folic acid   1 mg Oral Daily   furosemide   40 mg Oral Daily   lactulose   20 g Oral BID   levothyroxine   150 mcg Oral Q0600   megestrol   40 mg Oral BID   midodrine   15 mg Oral TID WC   pantoprazole   40 mg Oral BID AC   rifaximin   550 mg Oral BID   rOPINIRole   0.25 mg Oral QHS   sodium chloride  flush  3 mL Intravenous Q12H   spironolactone   50 mg Oral Daily   tamsulosin   0.4 mg Oral Daily   thiamine   100 mg Oral Daily   venlafaxine  XR  75 mg Oral Daily   Continuous Infusions:     LOS: 7 days    Time spent: 35 minutes    Renato Applebaum, MD [facility]

## 2023-01-10 NOTE — Progress Notes (Signed)
 Mobility Specialist - Progress Note   01/10/23 1359  Therapy Vitals  Patient Position (if appropriate) Orthostatic Vitals  Orthostatic Lying   BP- Lying 109/65  Orthostatic Sitting  BP- Sitting 119/67  Orthostatic Standing at 0 minutes  BP- Standing at 0 minutes (!) 81/49  Orthostatic Standing at 3 minutes  BP- Standing at 3 minutes (!) 73/43  Mobility  Activity Stood at bedside;Transferred to/from [facility]  Level of Assistance Modified independent, requires aide device or extra time  Press Photographer wheel walker  Activity Response Tolerated well  Mobility Referral Yes  Mobility visit 1 Mobility  Mobility Specialist Start Time (ACUTE ONLY) 1328  Mobility Specialist Stop Time (ACUTE ONLY) 1357  Mobility Specialist Time Calculation (min) (ACUTE ONLY) 29 min   Pt received in bed and agreeable to mobility. Prior to ambulation, pt requested assistance to [facility] for BM. No complaints during session. Ambulation cut short d/t drop in BP. Pt to bed after session with all needs met.    [facility]

## 2023-01-11 DIAGNOSIS — R7881 Bacteremia: Secondary | ICD-10-CM | POA: Insufficient documentation

## 2023-01-11 DIAGNOSIS — K7290 Hepatic failure, unspecified without coma: Secondary | ICD-10-CM

## 2023-01-11 DIAGNOSIS — L03115 Cellulitis of right lower limb: Secondary | ICD-10-CM

## 2023-01-11 DIAGNOSIS — N179 Acute kidney failure, unspecified: Secondary | ICD-10-CM

## 2023-01-11 DIAGNOSIS — K7460 Unspecified cirrhosis of liver: Secondary | ICD-10-CM

## 2023-01-11 DIAGNOSIS — N939 Abnormal uterine and vaginal bleeding, unspecified: Secondary | ICD-10-CM

## 2023-01-11 LAB — CBC WITH DIFFERENTIAL/PLATELET
Abs Immature Granulocytes: 0.02 10*3/uL (ref 0.00–0.07)
Basophils Absolute: 0 10*3/uL (ref 0.0–0.1)
Basophils Relative: 1 %
Eosinophils Absolute: 0.5 10*3/uL (ref 0.0–0.5)
Eosinophils Relative: 9 %
HCT: 28.8 % — ABNORMAL LOW (ref 36.0–46.0)
Hemoglobin: 8.6 g/dL — ABNORMAL LOW (ref 12.0–15.0)
Immature Granulocytes: 0 %
Lymphocytes Relative: 36 %
Lymphs Abs: 2 10*3/uL (ref 0.7–4.0)
MCH: 28.6 pg (ref 26.0–34.0)
MCHC: 29.9 g/dL — ABNORMAL LOW (ref 30.0–36.0)
MCV: 95.7 fL (ref 80.0–100.0)
Monocytes Absolute: 0.6 10*3/uL (ref 0.1–1.0)
Monocytes Relative: 10 %
Neutro Abs: 2.5 10*3/uL (ref 1.7–7.7)
Neutrophils Relative %: 44 %
Platelets: 140 10*3/uL — ABNORMAL LOW (ref 150–400)
RBC: 3.01 MIL/uL — ABNORMAL LOW (ref 3.87–5.11)
RDW: 16.8 % — ABNORMAL HIGH (ref 11.5–15.5)
WBC: 5.7 10*3/uL (ref 4.0–10.5)
nRBC: 0 % (ref 0.0–0.2)

## 2023-01-11 LAB — MAGNESIUM: Magnesium: 1.6 mg/dL — ABNORMAL LOW (ref 1.7–2.4)

## 2023-01-11 LAB — COMPREHENSIVE METABOLIC PANEL
ALT: 19 U/L (ref 0–44)
AST: 27 U/L (ref 15–41)
Albumin: 2.4 g/dL — ABNORMAL LOW (ref 3.5–5.0)
Alkaline Phosphatase: 80 U/L (ref 38–126)
Anion gap: 3 — ABNORMAL LOW (ref 5–15)
BUN: 16 mg/dL (ref 8–23)
CO2: 19 mmol/L — ABNORMAL LOW (ref 22–32)
Calcium: 8 mg/dL — ABNORMAL LOW (ref 8.9–10.3)
Chloride: 116 mmol/L — ABNORMAL HIGH (ref 98–111)
Creatinine, Ser: 0.85 mg/dL (ref 0.44–1.00)
GFR, Estimated: >60 mL/min (ref >60)
Glucose, Bld: 109 mg/dL — ABNORMAL HIGH (ref 70–99)
Potassium: 4.1 mmol/L (ref 3.5–5.1)
Sodium: 138 mmol/L (ref 135–145)
Total Bilirubin: 0.9 mg/dL (ref 0.0–1.2)
Total Protein: 5.2 g/dL — ABNORMAL LOW (ref 6.5–8.1)

## 2023-01-11 LAB — PHOSPHORUS: Phosphorus: 4 mg/dL (ref 2.5–4.6)

## 2023-01-11 MED ORDER — MAGNESIUM SULFATE 2 GM/50ML IV SOLN
2.0000 g | Freq: Once | INTRAVENOUS | Status: AC
  Administered 2023-01-11: 2 g via INTRAVENOUS
  Filled 2023-01-11: qty 50

## 2023-01-11 NOTE — Progress Notes (Signed)
  Progress Note   Patient: [name] FMW:[id number] DOB: Sep 11, 1955 DOA: 01/02/2023     8 DOS: the patient was seen and examined on 01/11/2023   Brief hospital course:  Assessment and Plan: Decompensated nonalcoholic hepatic cirrhosis Recurrent hepatic encephalopathy, ascites and coagulopathy.   Appears stable now.   Continue lactulose  and rifaximin .  Follow-up with GI as an outpatient.     Right lower extremity cellulitis Klebsiella pneumonia bacteremia from culture 12/27 Treated with antibiotics with clinical resolution.  No other source of Klebsiella noted. Appears clinically resolved.  Orthostatic hypotension  Chronic issue on home midodrine  that was not continued on admission.   Seems to be improving.  Continue midodrine .  Reevaluate tomorrow.     Dysfunctional uterine bleeding Related to fibroids and also coagulopathic with liver failure.  Previously seen by OB/GYN.  She has an appointment with OB/GYN next week.  Previously responded to Megace . Lovenox  discontinued.  Given 1 dose of vitamin K .  Started on Megace  40 mg twice daily and will need to send home on Megace .  Keep up follow-up with OB/GYN next week.     AKI -- resolved   Deconditioning and debility: Will go home with home health PT.   Moderate aortic stenosis and chronic diastolic heart failure.    Morbid obesity Body mass index is 43.4 kg/m.   Chronic medical problems: Cerebral atherosclerosis: Not on antiplatelet.  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 with subsequent takedown Hypothyroidism: Continue home Levothyroxine .  IDA, thrombocytopenia Mood disorder: Continue home venlafaxine  RLS: Continue home ropinirole  left eye blindness    Subjective:  Feels better but still light-headed with movement  Physical Exam: Vitals:   01/11/23 0917 01/11/23 0927 01/11/23 0933 01/11/23 1401  BP: (!) 115/54 (!) 159/72 126/70  117/62  Pulse: 72 74 90 80  Resp: 16   17  Temp: 97.9 F (36.6 C)   97.9 F (36.6 C)  TempSrc: Oral   Oral  SpO2: 99% 100% 98% 97%  Weight:      Height:       Physical Exam Vitals reviewed.  Constitutional:      General: She is not in acute distress.    Appearance: She is not ill-appearing or toxic-appearing.  Cardiovascular:     Rate and Rhythm: Normal rate and regular rhythm.     Heart sounds: No murmur heard. Pulmonary:     Effort: Pulmonary effort is normal. No respiratory distress.     Breath sounds: No wheezing, rhonchi or rales.  Musculoskeletal:     Right lower leg: No edema.     Left lower leg: No edema.     Comments: Minimal residual erythema RLE  Neurological:     Mental Status: She is alert.  Psychiatric:        Mood and Affect: Mood normal.        Behavior: Behavior normal.     Data Reviewed: Creatinine within normal limits, magnesium  1.6 Hemoglobin stable 8.6.  Platelets recovering, 140.  WBC within normal limits.  Family Communication: son at bedside  Disposition: Status is: Inpatient Remains inpatient appropriate because: s/p infection     Time spent: 20 minutes  Author: Toribio Door, MD 01/11/2023 7:23 PM  For on call review [email or web address].

## 2023-01-11 NOTE — Plan of Care (Signed)

## 2023-01-11 NOTE — Progress Notes (Signed)
 PT Right Breast red, hot, and swollen with moderate lump. MD notified no new orders at this time. PT given pain medication for severe pain in the area. Breast propped on a pillow for support.

## 2023-01-12 ENCOUNTER — Ambulatory Visit: Payer: Medicare (Managed Care) | Admitting: Obstetrics and Gynecology

## 2023-01-12 DIAGNOSIS — K7682 Hepatic encephalopathy: Secondary | ICD-10-CM | POA: Diagnosis not present

## 2023-01-12 DIAGNOSIS — K7290 Hepatic failure, unspecified without coma: Secondary | ICD-10-CM | POA: Diagnosis not present

## 2023-01-12 DIAGNOSIS — N939 Abnormal uterine and vaginal bleeding, unspecified: Secondary | ICD-10-CM | POA: Diagnosis not present

## 2023-01-12 MED ORDER — SULFAMETHOXAZOLE-TRIMETHOPRIM 800-160 MG PO TABS
1.0000 | ORAL_TABLET | Freq: Two times a day (BID) | ORAL | Status: DC
  Administered 2023-01-12 – 2023-01-13 (×3): 1 via ORAL
  Filled 2023-01-12 (×3): qty 1

## 2023-01-12 MED ORDER — NYSTATIN 100000 UNIT/GM EX POWD
Freq: Three times a day (TID) | CUTANEOUS | Status: DC
  Filled 2023-01-12: qty 15

## 2023-01-12 NOTE — Progress Notes (Signed)
 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  Admitted for decompensated NASH cirrhosis and recurrent hepatic encephalopathy as well as possible right lower extremity cellulitis.  Consultants None  Procedures/Events 12/23 admission  Assessment and Plan: Decompensated nonalcoholic hepatic cirrhosis Recurrent hepatic encephalopathy, ascites and coagulopathy.   Appears stable Continue lactulose  and rifaximin .  Follow-up with GI as an outpatient.    Right breast erythema and induration concerning for cellulitis Bilateral breast cutaneous Candida infection Just completed 10 days of ceftriaxone .  No fluctuance noted.  Will trial Bactrim .  May be just yeast although induration is unusual. Topical nystatin .   Right lower extremity cellulitis -- resolved Klebsiella pneumonia bacteremia from culture 12/27 Treated with antibiotics with clinical resolution.  No other source of Klebsiella noted. Appears clinically resolved.   Orthostatic hypotension  Chronic issue on home midodrine  that was not continued on admission.   Solved.  Orthostatics negative today.  Continue midodrine .   Dysfunctional uterine bleeding Chronic normocytic anemia Related to fibroids and also coagulopathic with liver failure.  Previously seen by OB/GYN.  She has an appointment with OB/GYN next week.  Previously responded to Megace . Lovenox  discontinued.  Given 1 dose of vitamin K .  Started on Megace  40 mg twice daily and will need to send home on Megace .  Keep up follow-up with OB/GYN next week.     AKI -- resolved   Deconditioning and debility: Will go home with home  health PT.   Moderate aortic stenosis and chronic diastolic heart failure.     Morbid obesity Body mass index is 43.4 kg/m.  Chronic medical problems: Cerebral atherosclerosis: Not on antiplatelet.  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 with subsequent takedown Hypothyroidism Mood disorder: Continue home venlafaxine  RLS: Continue home ropinirole  GERD: Chronic abdominal pain: Continue home Bentyl , famotidine , pantoprazole  Left eye blindness    Subjective:  Feels better but has tenderness of right breast  Physical Exam: Vitals:   01/11/23 1401 01/11/23 2200 01/12/23 0718 01/12/23 0830  BP: 117/62 (!) 113/54 (!) 103/50 (!) 101/56  Pulse: 80 75 75 81  Resp: 17 18 18    Temp: 97.9 F (36.6 C) 98.2 F (36.8 C) 98.6 F (37 C)   TempSrc: Oral Oral Oral   SpO2: 97% 99% 100%   Weight:      Height:       Physical Exam Vitals and nursing note reviewed. Exam conducted with a chaperone present.  Constitutional:      General: She is not in acute distress.    Appearance: She is not ill-appearing or toxic-appearing.  Cardiovascular:     Rate and Rhythm: Normal rate and regular rhythm.     Heart sounds: No murmur heard. Pulmonary:     Effort: Pulmonary effort is normal. No respiratory distress.     Breath sounds: No wheezing, rhonchi or rales.  Chest:       Comments: Pt examined with chaperone RN Dania, there is very mild tenderness over the right breast between 6 and 9:00 with some induration on the underside of the breast and erythema.  There is also some moisture related skin changes consistent with Candida of the under  breast and axilla.  Contralateral breast also has some moisture related erythema suggestive of yeast.  No fluctuance noted.  No open wounds.  No boils.  No abscess. Neurological:     Mental Status: She is alert.  Psychiatric:        Mood and Affect: Mood normal.        Behavior:  Behavior normal.   Right lower extremity erythema appears essentially resolved.  Data Reviewed: No new labs  Family Communication: none present  Disposition: Status is: Inpatient Remains inpatient appropriate because: orthostasis, right breast erythema     Time spent: 35 minutes  Author: Toribio Door, MD 01/12/2023 12:30 PM  For on call review [email or web address].

## 2023-01-12 NOTE — Plan of Care (Signed)

## 2023-01-12 NOTE — Plan of Care (Signed)

## 2023-01-12 NOTE — Progress Notes (Signed)
 Physical Therapy Treatment Patient Details Name: [name] MRN: [id number] DOB: [birth year] Today's Date: 01/12/2023   History of Present Illness 68 y.o. female was brought into the emergency department due to concern for mild worsening confusion in addition to leg pain/swelling on 01/06/23. Dx of hepatic encephalopathy, RLE cellulitis. 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    PT Comments  Improved activity tolerance today. Last session pt was orthostatic, today she denied dizziness in standing and with walking. BP supine 113/53, sit 124/62, stand 106/53. Pt's son reports pt had not been receiving midodrine  a few days ago when she was orthostatic but she is now.  Pt ambulated 84' with RW, no loss of balance, no dizziness, distance limited by fatigue.    If plan is discharge home, recommend the following: A little help with walking and/or transfers;A little help with bathing/dressing/bathroom;Assistance with cooking/housework;Assist for transportation;Help with stairs or ramp for entrance   Can travel by private vehicle        Equipment Recommendations  None recommended by PT    Recommendations for Other Services       Precautions / Restrictions Precautions Precautions: Fall Precaution Comments: denies falls in past 6 months Restrictions Weight Bearing Restrictions Per Provider Order: No     Mobility  Bed Mobility Overal bed mobility: Needs Assistance Bed Mobility: Supine to Sit     Supine to sit: Min assist, HOB elevated, Used rails     General bed mobility comments: assist to raise trunk    Transfers Overall transfer level: Needs assistance Equipment used: Rolling walker (2  wheels) Transfers: Sit to/from Stand Sit to Stand: Supervision           General transfer comment: VCs hand placement, no dizziness in standing    Ambulation/Gait   Gait Distance (Feet): 65 Feet Assistive device: Rolling walker (2 wheels) Gait Pattern/deviations: Step-through pattern, Decreased step length - left, Decreased step length - right Gait velocity: decreased     General Gait Details: no loss of balance, pt denied dizziness, distance limited by fatigue   Stairs             Wheelchair Mobility     Tilt Bed    Modified Rankin (Stroke Patients Only)       Balance Overall balance assessment: Needs assistance Sitting-balance support: No upper extremity supported, Feet supported Sitting balance-Leahy Scale: Good     Standing balance support: Bilateral upper extremity supported, Reliant on assistive device for balance Standing balance-Leahy Scale: Poor Standing balance comment: B UE support at [employer]                            Cognition Arousal: Alert Behavior During Therapy: WFL for tasks assessed/performed Overall Cognitive Status: Within Functional Limits for tasks assessed                                          Exercises      General Comments        Pertinent Vitals/Pain Pain Assessment Pain Score: 8  Pain Location: neck Pain Descriptors / Indicators: Sore Pain Intervention(s): Limited activity within patient's tolerance, Monitored during session, Patient requesting  pain meds-RN notified    Home Living                          Prior Function            PT Goals (current goals can now be found in the care plan section) Acute Rehab PT Goals Patient Stated Goal: go to the beach wtih son PT Goal Formulation: With patient Time For Goal Achievement: 01/18/23 Potential to Achieve Goals: Good Progress towards PT goals: Progressing toward goals    Frequency    Min 1X/week      PT Plan       Co-evaluation              AM-PAC PT 6 Clicks Mobility   Outcome Measure  Help needed turning from your back to your side while in a flat bed without using bedrails?: None Help needed moving from lying on your back to sitting on the side of a flat bed without using bedrails?: A Little Help needed moving to and from a bed to a chair (including a wheelchair)?: None Help needed standing up from a chair using your arms (e.g., wheelchair or bedside chair)?: None Help needed to walk in hospital room?: None Help needed climbing 3-5 steps with a railing? : A Little 6 Click Score: 22    End of Session   Activity Tolerance: Patient limited by fatigue;Patient tolerated treatment well Patient left: in chair;with call bell/phone within reach;with family/visitor present Nurse Communication: Mobility status PT Visit Diagnosis: Muscle weakness (generalized) (M62.81);Difficulty in walking, not elsewhere classified (R26.2);Pain     Time: 9074-9048 PT Time Calculation (min) (ACUTE ONLY): 26 min  Charges:    $Gait Training: 8-22 mins $Therapeutic Activity: 8-22 mins PT General Charges $$ ACUTE PT VISIT: 1 Visit                     Arman Delon Copp PT 01/12/2023  Acute Rehabilitation Services  Office [phone]

## 2023-01-13 DIAGNOSIS — R7881 Bacteremia: Secondary | ICD-10-CM | POA: Diagnosis not present

## 2023-01-13 DIAGNOSIS — L03115 Cellulitis of right lower limb: Secondary | ICD-10-CM | POA: Diagnosis not present

## 2023-01-13 DIAGNOSIS — N939 Abnormal uterine and vaginal bleeding, unspecified: Secondary | ICD-10-CM | POA: Diagnosis not present

## 2023-01-13 DIAGNOSIS — K7682 Hepatic encephalopathy: Secondary | ICD-10-CM | POA: Diagnosis not present

## 2023-01-13 MED ORDER — MEGESTROL ACETATE 40 MG PO TABS: 40.0000 mg | ORAL_TABLET | Freq: Two times a day (BID) | ORAL | 0 refills | Status: DC

## 2023-01-13 MED ORDER — NYSTATIN 100000 UNIT/GM EX CREA: TOPICAL_CREAM | Freq: Two times a day (BID) | CUTANEOUS | 1 refills | Status: DC

## 2023-01-13 MED ORDER — SULFAMETHOXAZOLE-TRIMETHOPRIM 800-160 MG PO TABS: 1.0000 | ORAL_TABLET | Freq: Two times a day (BID) | ORAL | 0 refills | Status: AC

## 2023-01-13 NOTE — Progress Notes (Signed)
 Discharge instructions reviewed with patient and son. All questions answered. All belongings accounted for. Patient to follow up with MD in  1 weeks. Patient medications sent to pharmacy. PIV removed. Assisted via WC to private vehicle.

## 2023-01-13 NOTE — Plan of Care (Signed)

## 2023-01-13 NOTE — Discharge Summary (Signed)
 Physician Discharge Summary   Patient: [name] MRN: [id number] DOB: 08/22/1955  Admit date:     01/02/2023  Discharge date: 01/13/23  Discharge Physician: Toribio Door   PCP: [name], MD   Recommendations at discharge:   Ongoing care for cirrhosis For further evaluation of dysfunctional bleeding, see below  Discharge Diagnoses: Principal Problem:   Hepatic encephalopathy (HCC) Active Problems:   Acute hepatic encephalopathy (HCC)   Cirrhosis due to NASH   Vaginal bleeding   AKI (acute kidney injury) (HCC)   Obesity, Class III, BMI 40-49.9 (morbid obesity) (HCC)   Decompensated cirrhosis (HCC)   Cellulitis of right leg   Bacteremia  Resolved Problems:   * No resolved hospital problems. *  Hospital Course: 68 year old woman PMH decompensated NASH cirrhosis with recurrent hepatic encephalopathy, chronic hypotension on midodrine , PE/DVT off anticoagulation, short gut syndrome who presented with confusion and right lower extremity pain.  Admitted for decompensated NASH cirrhosis and recurrent hepatic encephalopathy as well as possible right lower extremity cellulitis.  Gradually improved with resolution of cellulitis and stabilization of cirrhosis and hepatic encephalopathy.  Individual issues as below.  Consultants None  Procedures/Events   Decompensated nonalcoholic hepatic cirrhosis Recurrent hepatic encephalopathy, ascites and coagulopathy.   Appears stable Continue lactulose  and rifaximin .  Follow-up with GI as an outpatient.     Right breast erythema and induration concerning for cellulitis Bilateral breast cutaneous Candida infection Just completed 10 days of ceftriaxone .  No fluctuance noted.  Will trial Bactrim .  May be just yeast although induration is unusual. Improving.  Continue topical nystatin  and Bactrim .   Right lower extremity cellulitis -- resolved Klebsiella pneumonia bacteremia from culture 12/27 Treated with antibiotics with  clinical resolution.  No other source of Klebsiella noted.  Likely bacterial translocation given history of prior colectomy, ileostomy, short gut. Appears clinically resolved.   Orthostatic hypotension  Chronic issue on home midodrine  that was not continued on admission.   Resolved.  Orthostatics negative on repeat.  Continue midodrine .   Dysfunctional uterine bleeding Chronic normocytic anemia Related to fibroids and also coagulopathic with liver failure.  Previously seen by OB/GYN.  She has an appointment with OB/GYN next week.  Previously responded to Megace . Lovenox  discontinued.  Given 1 dose of vitamin K .  Started on Megace  40 mg twice daily and will need to send home on Megace .  Keep up follow-up with OB/GYN next week.     AKI -- resolved   Deconditioning and debility: Will go home with home health PT.   Moderate aortic stenosis and chronic diastolic heart failure.     Morbid obesity Body mass index is 43.4 kg/m.   Chronic medical problems: Cerebral atherosclerosis: Not on antiplatelet.  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 with subsequent takedown Hypothyroidism Mood disorder: Continue home venlafaxine  RLS: Continue home ropinirole  GERD: Chronic abdominal pain: Continue home Bentyl , famotidine , pantoprazole  Left eye blindness  Disposition: Home Diet recommendation:  Discharge Diet Orders (From admission, onward)     Start     Ordered   01/06/23 0000  Diet - low sodium heart healthy        01/06/23 1411           Cardiac diet DISCHARGE MEDICATION: Allergies as of 01/13/2023       Reactions   Ace Inhibitors Anaphylaxis, Swelling   Ivp Dye [iodinated Contrast Media] Anaphylaxis   Desitin [zinc  Oxide] Rash, Other (See Comments)   Worsens rash  Gadolinium Derivatives Other (See Comments)   Reaction??   Hydroxyzine  Other (See Comments)   Affects the mind   Chlorhexidine  Rash,  Other (See Comments)   WORSENS RASHES   Doxycycline Rash   Penicillins Rash   Zofran  [ondansetron ] Rash        Medication List     TAKE these medications    atorvastatin  40 MG tablet Commonly known as: LIPITOR Take 40 mg by mouth in the morning.   colchicine  0.6 MG tablet Take 0.6 mg by mouth daily as needed (gout flares).   cyanocobalamin  1000 MCG tablet Commonly known as: VITAMIN B12 Take 2,000 mcg by mouth every Friday.   dicyclomine  10 MG capsule Commonly known as: BENTYL  Take 1 capsule (10 mg total) by mouth 3 (three) times daily as needed for spasms (abdominal cramping).   EPINEPHrine  0.3 mg/0.3 mL Soaj injection Commonly known as: EPI-PEN Inject 0.3 mg into the muscle as needed for anaphylaxis.   famotidine  20 MG tablet Commonly known as: PEPCID  Take 20 mg by mouth in the morning.   folic acid  1 MG tablet Commonly known as: FOLVITE  Take 1 tablet (1 mg total) by mouth daily.   furosemide  40 MG tablet Commonly known as: LASIX  Take 1 tablet (40 mg total) by mouth daily. Take additional 40 mg if there is a weight gain of 3 lbs in1 day or 5 lbs in 1 week.   HYDROcodone -acetaminophen  10-325 MG tablet Commonly known as: NORCO Take 1 tablet by mouth every 8 (eight) hours as needed for moderate pain (pain score 4-6).   IRON -VITAMIN C  PO Take 1 tablet by mouth daily with breakfast.   lactulose  10 GM/15ML solution Commonly known as: CHRONULAC  Take 30 mLs (20 g total) by mouth 3 (three) times daily. Take to ensure that there is 2 loose bowel movement EVERY DAY What changed:  when to take this reasons to take this additional instructions   levothyroxine  150 MCG tablet Commonly known as: SYNTHROID  Take 150 mcg by mouth daily before breakfast.   lidocaine  5 % Commonly known as: LIDODERM  Place 1 patch onto the skin daily. Remove & Discard patch within 12 hours or as directed by MD What changed:  when to take this reasons to take this additional  instructions   megestrol  40 MG tablet Commonly known as: MEGACE  Take 1 tablet (40 mg total) by mouth 2 (two) times daily.   methocarbamol  500 MG tablet Commonly known as: ROBAXIN  Take 500 mg by mouth in the morning.   midodrine  10 MG tablet Commonly known as: PROAMATINE  Take 1 tablet (10 mg total) by mouth 3 (three) times daily with meals.   naloxone 4 MG/0.1ML Liqd nasal spray kit Commonly known as: NARCAN Place 1 spray into the nose as needed (accidental overdose).   nystatin  powder Apply 1 Application topically 3 (three) times daily as needed (for irritation- affected areas). What changed: Another medication with the same name was changed. Make sure you understand how and when to take each.   nystatin  cream Commonly known as: MYCOSTATIN  Apply topically 2 (two) times daily. What changed:  how much to take when to take this reasons to take this   pantoprazole  40 MG tablet Commonly known as: PROTONIX  Take 1 tablet (40 mg total) by mouth 2 (two) times daily before a meal.   promethazine  12.5 MG tablet Commonly known as: PHENERGAN  Take 12.5 mg by mouth every 6 (six) hours as needed for nausea or vomiting.   rifaximin  550 MG Tabs tablet Commonly  known as: XIFAXAN  Take 550 mg by mouth 2 (two) times daily.   rOPINIRole  0.25 MG tablet Commonly known as: REQUIP  Take 1 tablet (0.25 mg total) by mouth at bedtime. What changed: when to take this   spironolactone  50 MG tablet Commonly known as: ALDACTONE  Take 1 tablet (50 mg total) by mouth daily.   sulfamethoxazole -trimethoprim  800-160 MG tablet Commonly known as: BACTRIM  DS Take 1 tablet by mouth every 12 (twelve) hours for 4 days.   tamsulosin  0.4 MG Caps capsule Commonly known as: FLOMAX  Take 1 capsule (0.4 mg total) by mouth daily.   triamcinolone  cream 0.5 % Commonly known as: KENALOG  Apply to affected area of right breast twice daily as directed. What changed:  how much to take when to take this reasons to  take this   TYLENOL  500 MG tablet Generic drug: acetaminophen  Take 1,000 mg by mouth every 6 (six) hours as needed for mild pain (pain score 1-3) or headache.   venlafaxine  XR 75 MG 24 hr capsule Commonly known as: EFFEXOR -XR Take 75 mg by mouth daily with breakfast.   Vitamin D  (Ergocalciferol ) 1.25 MG (50000 UNIT) Caps capsule Commonly known as: DRISDOL  Take 50,000 Units by mouth every Wednesday.        Follow-up Information     [name], MD Follow up in 1 week(s).   Specialty: Family Medicine Contact information: [address] [place] [address] [phone]         [name], MD. Schedule an appointment as soon as possible for a visit in 2 week(s).   Specialty: Family Medicine Contact information: [address] [place] [address] [phone]                Feels better  Discharge Exam: Filed Weights   01/02/23 2153 01/04/23 0533 01/06/23 0500  Weight: 127.9 kg 108 kg 107.6 kg   Physical Exam Vitals reviewed. Exam conducted with a chaperone present.  Constitutional:      General: She is not in acute distress.    Appearance: She is not ill-appearing or toxic-appearing.  Cardiovascular:     Rate and Rhythm: Normal rate and regular rhythm.     Heart sounds: No murmur heard. Pulmonary:     Effort: Pulmonary effort is normal. No respiratory distress.     Breath sounds: No wheezing, rhonchi or rales.  Chest:     Comments: Exam conducted with Lennart PEAK as chaperone.  Right breast appears much improved with decreased induration and erythema.  Moisture related Candida infection appears improved as well.  Left side breast also appears improved with decreased erythema. Neurological:     Mental Status: She is alert.  Psychiatric:        Mood and Affect: Mood normal.        Behavior: Behavior normal.      Condition at discharge: good  The results of significant diagnostics from this hospitalization  (including imaging, microbiology, ancillary and laboratory) are listed below for reference.   Imaging Studies: ECHOCARDIOGRAM COMPLETE Result Date: 01/03/2023    ECHOCARDIOGRAM REPORT   Patient Name:   SHARILYNN CASSITY Date of Exam: 01/03/2023 Medical Rec #:  [id number]      Height:       62.0 in Accession #:    [id number]     Weight:       282.0 lb Date of Birth:  12-Jun-1955       BSA:  2.212 m Patient Age:    67 years       BP:           102/53 mmHg Patient Gender: F              HR:           87 bpm. Exam Location:  Inpatient Procedure: 2D Echo, Color Doppler and Cardiac Doppler Indications:    Aortic stenosis  History:        Patient has prior history of Echocardiogram examinations, most                 recent 06/13/2022. Signs/Symptoms:Hypotension; Risk Factors:Former                 Smoker.  Sonographer:    Juanita Shaw Referring Phys: [id number] JONATHAN SEGARS IMPRESSIONS  1. Left ventricular ejection fraction, by estimation, is 60 to 65%. The left ventricle has normal function. The left ventricle has no regional wall motion abnormalities. There is mild asymmetric left ventricular hypertrophy of the septal segment. Left ventricular diastolic function could not be evaluated.  2. Right ventricular systolic function is normal. The right ventricular size is normal. There is mildly elevated pulmonary artery systolic pressure.  3. The mitral valve is degenerative. No evidence of mitral valve regurgitation. Mild mitral stenosis. The mean mitral valve gradient is 4.0 mmHg. Severe mitral annular calcification.  4. Normal stroke volume index, DVI 0.33; Peak velocity on highest gradient over 4 m/s. The aortic valve is tricuspid. There is mild calcification of the aortic valve. There is mild thickening of the aortic valve. Aortic valve regurgitation is not visualized. Severe aortic valve stenosis. Aortic valve mean gradient measures 36.0 mmHg. Aortic valve Vmax measures 4.09 m/s. Comparison(s): Prior images reviewed  side by side. Gradients are higher on this assessment. FINDINGS  Left Ventricle: Left ventricular ejection fraction, by estimation, is 60 to 65%. The left ventricle has normal function. The left ventricle has no regional wall motion abnormalities. The left ventricular internal cavity size was normal in size. There is  mild asymmetric left ventricular hypertrophy of the septal segment. Left ventricular diastolic function could not be evaluated due to mitral annular calcification (moderate or greater). Left ventricular diastolic function could not be evaluated. Right Ventricle: The right ventricular size is normal. No increase in right ventricular wall thickness. Right ventricular systolic function is normal. There is mildly elevated pulmonary artery systolic pressure. The tricuspid regurgitant velocity is 2.96  m/s, and with an assumed right atrial pressure of 3 mmHg, the estimated right ventricular systolic pressure is 38.0 mmHg. Left Atrium: Left atrial size was normal in size. Right Atrium: Right atrial size was normal in size. Pericardium: There is no evidence of pericardial effusion. Mitral Valve: The mitral valve is degenerative in appearance. Severe mitral annular calcification. No evidence of mitral valve regurgitation. Mild mitral valve stenosis. MV peak gradient, 7.3 mmHg. The mean mitral valve gradient is 4.0 mmHg. Tricuspid Valve: The tricuspid valve is grossly normal. Tricuspid valve regurgitation is mild. Aortic Valve: Normal stroke volume index, DVI 0.33; Peak velocity on highest gradient over 4 m/s. The aortic valve is tricuspid. There is mild calcification of the aortic valve. There is mild thickening of the aortic valve. Aortic valve regurgitation is not visualized. Severe aortic stenosis is present. Aortic valve mean gradient measures 36.0 mmHg. Aortic valve peak gradient measures 66.9 mmHg. Aortic valve area, by VTI measures 1.10 cm. Pulmonic Valve: The pulmonic valve was grossly normal. Pulmonic  valve  regurgitation is not visualized. No evidence of pulmonic stenosis. Aorta: The aortic root and ascending aorta are structurally normal, with no evidence of dilitation. IAS/Shunts: The atrial septum is grossly normal.  LEFT VENTRICLE PLAX 2D LVIDd:         5.10 cm      Diastology LVIDs:         3.20 cm      LV e' medial:    8.27 cm/s LV PW:         1.00 cm      LV E/e' medial:  14.9 LV IVS:        1.30 cm      LV e' lateral:   9.36 cm/s LVOT diam:     1.90 cm      LV E/e' lateral: 13.1 LV SV:         78 LV SV Index:   35 LVOT Area:     2.84 cm  LV Volumes (MOD) LV vol d, MOD A2C: 203.0 ml LV vol d, MOD A4C: 227.0 ml LV vol s, MOD A2C: 67.2 ml LV vol s, MOD A4C: 84.4 ml LV SV MOD A2C:     135.8 ml LV SV MOD A4C:     227.0 ml LV SV MOD BP:      139.2 ml RIGHT VENTRICLE             IVC RV Basal diam:  3.90 cm     IVC diam: 2.20 cm RV Mid diam:    2.50 cm RV S prime:     13.80 cm/s TAPSE (M-mode): 2.2 cm LEFT ATRIUM           Index        RIGHT ATRIUM           Index LA diam:      4.40 cm 1.99 cm/m   RA Area:     14.30 cm LA Vol (A2C): 73.2 ml 33.09 ml/m  RA Volume:   30.30 ml  13.70 ml/m LA Vol (A4C): 26.8 ml 12.11 ml/m  AORTIC VALVE                     PULMONIC VALVE AV Area (Vmax):    0.97 cm      PV Vmax:          1.99 m/s AV Area (Vmean):   1.21 cm      PV Peak grad:     15.8 mmHg AV Area (VTI):     1.10 cm      PR End Diast Vel: 4.33 msec AV Vmax:           409.00 cm/s AV Vmean:          224.600 cm/s AV VTI:            0.708 m AV Peak Grad:      66.9 mmHg AV Mean Grad:      36.0 mmHg LVOT Vmax:         140.00 cm/s LVOT Vmean:        95.500 cm/s LVOT VTI:          0.275 m LVOT/AV VTI ratio: 0.39  AORTA Ao Root diam: 3.00 cm Ao Asc diam:  3.20 cm MITRAL VALVE                TRICUSPID VALVE MV Area (PHT): 4.29 cm     TR Peak grad:   35.0 mmHg MV Area VTI:  2.42 cm     TR Vmax:        296.00 cm/s MV Peak grad:  7.3 mmHg MV Mean grad:  4.0 mmHg     SHUNTS MV Vmax:       1.36 m/s     Systemic VTI:  0.28  m MV Vmean:      91.8 cm/s    Systemic Diam: 1.90 cm MV Decel Time: 177 msec MV E velocity: 123.00 cm/s MV A velocity: 103.70 cm/s MV E/A ratio:  1.19 Stanly Leavens MD Electronically signed by Stanly Leavens MD Signature Date/Time: 01/03/2023/6:56:06 PM    Final    DG Abd 1 View Result Date: 01/03/2023 CLINICAL DATA:  68 year old female with confusion, ileus. EXAM: ABDOMEN - 1 VIEW COMPARISON:  CT Abdomen and Pelvis 10/01/2022 and earlier. FINDINGS: Stable cholecystectomy clips. Lung bases appear stable, negative. Augmented T12 compression fracture since September. Bulky calcified uterine fibroids redemonstrated in the pelvis. Gas-filled bowel loops in the abdomen and pelvis do appear to be both large and small bowel, while the stomach seems decompressed. Osteopenia. Stable abdominal and pelvic visceral contours. No definite pneumoperitoneum on these two views which may be supine. IMPRESSION: 1. Gas-filled small and large bowel bowel loops is compatible with ileus. Distal large bowel obstruction felt less likely. Follow-up abdominal radiographs may be valuable. 2. Bulky calcified uterine fibroids. Osteopenia. Augmented T12 compression fracture. Electronically Signed   By: VEAR Hurst M.D.   On: 01/03/2023 05:48   DG Chest Portable 1 View Result Date: 01/03/2023 CLINICAL DATA:  Confusion, tachycardia, tachypnea EXAM: PORTABLE CHEST 1 VIEW COMPARISON:  09/30/2022 FINDINGS: Cardiomegaly. Low lung volumes. Mild diffuse interstitial prominence throughout the lungs. No effusions. No acute bony abnormality. Aortic atherosclerosis. IMPRESSION: 1. Cardiomegaly. 2. Low lung volumes. Diffuse interstitial prominence could reflect edema or atypical infection. Electronically Signed   By: Franky Crease M.D.   On: 01/03/2023 00:26    Microbiology: Results for orders placed or performed during the hospital encounter of 01/02/23  Blood Culture (routine x 2)     Status: Abnormal   Collection Time: 01/02/23 10:51  PM   Specimen: BLOOD  Result Value Ref Range Status   Specimen Description   Final    BLOOD BLOOD RIGHT ARM Performed at [facility], [address] [place], [place] [address]    Special Requests   Final    BOTTLES DRAWN AEROBIC AND ANAEROBIC Blood Culture results may not be optimal due to an inadequate volume of blood received in culture bottles Performed at [facility], [address], [place], [place] [address]    Culture  Setup Time   Final    GRAM NEGATIVE RODS BOTTLES DRAWN AEROBIC ONLY CRITICAL RESULT CALLED TO, READ BACK BY AND VERIFIED WITH: PHARMD ABBEY ELLINGTON ON 01/03/23 @ 1721 BY DRT Performed at [facility], [address], [place], [place] [address]    Culture KLEBSIELLA PNEUMONIAE (A)  Final   Report Status 01/06/2023 FINAL  Final   Organism ID, Bacteria KLEBSIELLA PNEUMONIAE  Final   Organism ID, Bacteria KLEBSIELLA PNEUMONIAE  Final      Susceptibility   Klebsiella pneumoniae - MIC*    AMPICILLIN >=32 RESISTANT Resistant     CEFEPIME  <=0.12 SENSITIVE Sensitive     CEFTAZIDIME <=1 SENSITIVE Sensitive     CEFTRIAXONE  <=0.25 SENSITIVE Sensitive     CIPROFLOXACIN  <=0.25 SENSITIVE Sensitive     GENTAMICIN <=1 SENSITIVE Sensitive     IMIPENEM <=0.25 SENSITIVE Sensitive  TRIMETH /SULFA  <=20 SENSITIVE Sensitive     AMPICILLIN/SULBACTAM >=32 RESISTANT Resistant     PIP/TAZO 32 INTERMEDIATE Intermediate ug/mL   Klebsiella pneumoniae - KIRBY BAUER*    CEFAZOLIN  RESISTANT Resistant     * KLEBSIELLA PNEUMONIAE    KLEBSIELLA PNEUMONIAE  Blood Culture ID Panel (Reflexed)     Status: Abnormal   Collection Time: 01/02/23 10:51 PM  Result Value Ref Range Status   Enterococcus faecalis NOT DETECTED NOT DETECTED Final   Enterococcus Faecium NOT DETECTED NOT DETECTED Final   Listeria monocytogenes NOT DETECTED NOT DETECTED Final   Staphylococcus species NOT DETECTED NOT DETECTED Final   Staphylococcus aureus (BCID) NOT  DETECTED NOT DETECTED Final   Staphylococcus epidermidis NOT DETECTED NOT DETECTED Final    Comment: NOT DETECTED   Staphylococcus lugdunensis NOT DETECTED NOT DETECTED Final    Comment: NOT DETECTED   Streptococcus species NOT DETECTED NOT DETECTED Final   Streptococcus agalactiae NOT DETECTED NOT DETECTED Final   Streptococcus pneumoniae NOT DETECTED NOT DETECTED Final   Streptococcus pyogenes NOT DETECTED NOT DETECTED Final   A.calcoaceticus-baumannii NOT DETECTED NOT DETECTED Final   Bacteroides fragilis NOT DETECTED NOT DETECTED Final   Enterobacterales DETECTED (A) NOT DETECTED Corrected    Comment: CRITICAL RESULT CALLED TO, READ BACK BY AND VERIFIED WITH: EMERSON EDISON PHARMD, AT 9091 01/04/23 C.MORARU CORRECTED ON 12/25 AT 9090: PREVIOUSLY REPORTED AS NOT DETECTED    Enterobacter cloacae complex NOT DETECTED NOT DETECTED Final   Escherichia coli NOT DETECTED NOT DETECTED Final   Klebsiella aerogenes NOT DETECTED NOT DETECTED Final   Klebsiella oxytoca NOT DETECTED NOT DETECTED Final   Klebsiella pneumoniae DETECTED (A) NOT DETECTED Corrected    Comment: CRITICAL RESULT CALLED TO, READ BACK BY AND VERIFIED WITH: EMERSON EDISON PHARMD, AT 9091 01/04/23 C.MORARU CORRECTED ON 12/25 AT 0909: PREVIOUSLY REPORTED AS NOT DETECTED    Proteus species NOT DETECTED NOT DETECTED Final   Salmonella species NOT DETECTED NOT DETECTED Final   Serratia marcescens NOT DETECTED NOT DETECTED Final   Haemophilus influenzae NOT DETECTED NOT DETECTED Final   Neisseria meningitidis NOT DETECTED NOT DETECTED Final   Pseudomonas aeruginosa NOT DETECTED NOT DETECTED Final   Stenotrophomonas maltophilia NOT DETECTED NOT DETECTED Final   Candida albicans NOT DETECTED NOT DETECTED Final   Candida auris NOT DETECTED NOT DETECTED Final   Candida glabrata NOT DETECTED NOT DETECTED Final   Candida krusei NOT DETECTED NOT DETECTED Final   Candida parapsilosis NOT DETECTED NOT DETECTED Final   Candida tropicalis NOT  DETECTED NOT DETECTED Final   Cryptococcus neoformans/gattii NOT DETECTED NOT DETECTED Final    Comment: Performed at [facility], [address], [place], [place] [address]  Respiratory (~20 pathogens) panel by PCR     Status: None   Collection Time: 01/03/23  5:11 AM   Specimen: Nasopharyngeal Swab; Respiratory  Result Value Ref Range Status   Adenovirus NOT DETECTED NOT DETECTED Final   Coronavirus 229E NOT DETECTED NOT DETECTED Final    Comment: (NOTE) The Coronavirus on the Respiratory Panel, DOES NOT test for the novel  Coronavirus (2019 nCoV)    Coronavirus HKU1 NOT DETECTED NOT DETECTED Final   Coronavirus NL63 NOT DETECTED NOT DETECTED Final   Coronavirus OC43 NOT DETECTED NOT DETECTED Final   Metapneumovirus NOT DETECTED NOT DETECTED Final   Rhinovirus / Enterovirus NOT DETECTED NOT DETECTED Final   Influenza A NOT DETECTED NOT DETECTED Final   Influenza B NOT DETECTED NOT DETECTED Final  Parainfluenza Virus 1 NOT DETECTED NOT DETECTED Final   Parainfluenza Virus 2 NOT DETECTED NOT DETECTED Final   Parainfluenza Virus 3 NOT DETECTED NOT DETECTED Final   Parainfluenza Virus 4 NOT DETECTED NOT DETECTED Final   Respiratory Syncytial Virus NOT DETECTED NOT DETECTED Final   Bordetella pertussis NOT DETECTED NOT DETECTED Final   Bordetella Parapertussis NOT DETECTED NOT DETECTED Final   Chlamydophila pneumoniae NOT DETECTED NOT DETECTED Final   Mycoplasma pneumoniae NOT DETECTED NOT DETECTED Final    Comment: Performed at [facility], [address], [place], [place] [address]  Blood Culture (routine x 2)     Status: None   Collection Time: 01/03/23  8:48 AM   Specimen: BLOOD  Result Value Ref Range Status   Specimen Description   Final    BLOOD LEFT ANTECUBITAL Performed at [facility], [address], [place], [place] [address]    Special Requests   Final    BOTTLES DRAWN AEROBIC ONLY Blood Culture results may not be optimal due to an  inadequate volume of blood received in culture bottles Performed at [facility], [address], [place], [place] [address]    Culture   Final    NO GROWTH 5 DAYS Performed at [facility], [address], [place], [place] [address]    Report Status 01/08/2023 FINAL  Final    Labs: CBC: Recent Labs  Lab 01/07/23 1117 01/07/23 1609 01/08/23 0605 01/11/23 0620  WBC 4.5  --   --  5.7  NEUTROABS  --   --   --  2.5  HGB 7.9* 8.0* 7.7* 8.6*  HCT 25.6* 25.3* 24.4* 28.8*  MCV 95.5  --   --  95.7  PLT 110*  --   --  140*   Basic Metabolic Panel: Recent Labs  Lab 01/07/23 1117 01/08/23 0605 01/11/23 0620  NA 138 137 138  K 3.9 3.4* 4.1  CL 114* 114* 116*  CO2 17* 18* 19*  GLUCOSE 137* 101* 109*  BUN 18 17 16   CREATININE 1.03* 0.90 0.85  CALCIUM  7.9* 7.9* 8.0*  MG  --  1.8 1.6*  PHOS  --   --  4.0   Liver Function Tests: Recent Labs  Lab 01/11/23 0620  AST 27  ALT 19  ALKPHOS 80  BILITOT 0.9  PROT 5.2*  ALBUMIN  2.4*   CBG: No results for input(s): GLUCAP in the last 168 hours.  Discharge time spent: less than 30 minutes.  Signed: Toribio Door, MD [facility] 01/13/2023

## 2023-01-13 NOTE — Progress Notes (Signed)
 Discharge completed by Vincenza Hews, RN. See nurse's note for details.

## 2023-01-17 ENCOUNTER — Emergency Department (HOSPITAL_COMMUNITY): Payer: Medicare (Managed Care)

## 2023-01-17 ENCOUNTER — Emergency Department (HOSPITAL_COMMUNITY)
Admission: EM | Admit: 2023-01-17 | Discharge: 2023-01-18 | Disposition: A | Discharge status: Home / Self Care | Payer: Medicare (Managed Care) | Attending: Emergency Medicine | Admitting: Emergency Medicine

## 2023-01-17 ENCOUNTER — Encounter (HOSPITAL_COMMUNITY): Payer: Self-pay

## 2023-01-17 DIAGNOSIS — R062 Wheezing: Secondary | ICD-10-CM | POA: Insufficient documentation

## 2023-01-17 DIAGNOSIS — Z20822 Contact with and (suspected) exposure to covid-19: Secondary | ICD-10-CM | POA: Diagnosis not present

## 2023-01-17 DIAGNOSIS — R0602 Shortness of breath: Secondary | ICD-10-CM | POA: Diagnosis not present

## 2023-01-17 DIAGNOSIS — J069 Acute upper respiratory infection, unspecified: Secondary | ICD-10-CM | POA: Diagnosis not present

## 2023-01-17 DIAGNOSIS — R799 Abnormal finding of blood chemistry, unspecified: Secondary | ICD-10-CM | POA: Insufficient documentation

## 2023-01-17 DIAGNOSIS — L249 Irritant contact dermatitis, unspecified cause: Secondary | ICD-10-CM | POA: Diagnosis not present

## 2023-01-17 DIAGNOSIS — I10 Essential (primary) hypertension: Secondary | ICD-10-CM | POA: Insufficient documentation

## 2023-01-17 DIAGNOSIS — R238 Other skin changes: Secondary | ICD-10-CM

## 2023-01-17 DIAGNOSIS — R059 Cough, unspecified: Secondary | ICD-10-CM | POA: Diagnosis present

## 2023-01-17 LAB — RESP PANEL BY RT-PCR (RSV, FLU A&B, COVID)  RVPGX2
Influenza A by PCR: NEGATIVE
Influenza B by PCR: NEGATIVE
Resp Syncytial Virus by PCR: NEGATIVE
SARS Coronavirus 2 by RT PCR: NEGATIVE

## 2023-01-17 LAB — CBG MONITORING, ED: Glucose-Capillary: 88 mg/dL (ref 70–99)

## 2023-01-17 NOTE — ED Provider Triage Note (Signed)
 Emergency Medicine Provider Triage Evaluation Note  [name] , a 68 y.o. female  was evaluated in triage.  Pt complains of cough and rash behind her knees and in her groin.  Patient states that she has yeast infection which she was given Diflucan  for through [facility] but still has symptoms.  Patient's phone number is present to provide history as patient was short of breath.  Patient again member denied any chest pain from the patient but states that he was stable with viral symptoms and that they referred here to rule out pneumonia..  Review of Systems  Positive:  Negative:   Physical Exam  BP 128/76 (BP Location: Right Arm)   Pulse 90   Temp 98.6 F (37 C) (Oral)   Resp 20   SpO2 98%  Gen:   Awake, no distress   Resp:  Increased work of breathing MSK:   Moves extremities without difficulty  Other:    Medical Decision Making  Medically screening exam initiated at 5:09 PM.  Appropriate orders placed.  [name] was informed that the remainder of the evaluation will be completed by another provider, this initial triage assessment does not replace that evaluation, and the importance of remaining in the ED until their evaluation is complete.  Workup initiated, patient stable at this time   [name] 01/17/23 1710

## 2023-01-17 NOTE — ED Triage Notes (Signed)
 Pt presents with c/o skin problems/breakdown on the upper part of both legs where they are rubbing together. Pt also reporting a cough.

## 2023-01-18 LAB — CBC WITH DIFFERENTIAL/PLATELET
Abs Immature Granulocytes: 0.02 10*3/uL (ref 0.00–0.07)
Basophils Absolute: 0.1 10*3/uL (ref 0.0–0.1)
Basophils Relative: 1 %
Eosinophils Absolute: 0.7 10*3/uL — ABNORMAL HIGH (ref 0.0–0.5)
Eosinophils Relative: 8 %
HCT: 28.4 % — ABNORMAL LOW (ref 36.0–46.0)
Hemoglobin: 9 g/dL — ABNORMAL LOW (ref 12.0–15.0)
Immature Granulocytes: 0 %
Lymphocytes Relative: 26 %
Lymphs Abs: 2.1 10*3/uL (ref 0.7–4.0)
MCH: 29.6 pg (ref 26.0–34.0)
MCHC: 31.7 g/dL (ref 30.0–36.0)
MCV: 93.4 fL (ref 80.0–100.0)
Monocytes Absolute: 0.8 10*3/uL (ref 0.1–1.0)
Monocytes Relative: 10 %
Neutro Abs: 4.5 10*3/uL (ref 1.7–7.7)
Neutrophils Relative %: 55 %
Platelets: 156 10*3/uL (ref 150–400)
RBC: 3.04 MIL/uL — ABNORMAL LOW (ref 3.87–5.11)
RDW: 17.5 % — ABNORMAL HIGH (ref 11.5–15.5)
WBC: 8.2 10*3/uL (ref 4.0–10.5)
nRBC: 0 % (ref 0.0–0.2)

## 2023-01-18 LAB — URINALYSIS, ROUTINE W REFLEX MICROSCOPIC
Bilirubin Urine: NEGATIVE
Glucose, UA: NEGATIVE mg/dL
Ketones, ur: NEGATIVE mg/dL
Nitrite: NEGATIVE
Protein, ur: NEGATIVE mg/dL
Specific Gravity, Urine: 1.019 (ref 1.005–1.030)
pH: 5 (ref 5.0–8.0)

## 2023-01-18 LAB — COMPREHENSIVE METABOLIC PANEL
ALT: 24 U/L (ref 0–44)
AST: 36 U/L (ref 15–41)
Albumin: 2.9 g/dL — ABNORMAL LOW (ref 3.5–5.0)
Alkaline Phosphatase: 85 U/L (ref 38–126)
Anion gap: 6 (ref 5–15)
BUN: 18 mg/dL (ref 8–23)
CO2: 18 mmol/L — ABNORMAL LOW (ref 22–32)
Calcium: 8.3 mg/dL — ABNORMAL LOW (ref 8.9–10.3)
Chloride: 109 mmol/L (ref 98–111)
Creatinine, Ser: 1.18 mg/dL — ABNORMAL HIGH (ref 0.44–1.00)
GFR, Estimated: 51 mL/min — ABNORMAL LOW (ref >60)
Glucose, Bld: 96 mg/dL (ref 70–99)
Potassium: 4.5 mmol/L (ref 3.5–5.1)
Sodium: 133 mmol/L — ABNORMAL LOW (ref 135–145)
Total Bilirubin: 1.9 mg/dL — ABNORMAL HIGH (ref 0.0–1.2)
Total Protein: 6 g/dL — ABNORMAL LOW (ref 6.5–8.1)

## 2023-01-18 MED ORDER — ALBUTEROL SULFATE HFA 108 (90 BASE) MCG/ACT IN AERS
2.0000 | INHALATION_SPRAY | Freq: Once | RESPIRATORY_TRACT | Status: AC
  Administered 2023-01-18: 2 via RESPIRATORY_TRACT
  Filled 2023-01-18: qty 6.7

## 2023-01-18 NOTE — Discharge Instructions (Signed)
 You were seen today for shortness of breath and cough.  Your workup today is reassuring including chest x-ray and viral testing.  You still likely have a virus.  You do have some wheezing which is likely related to her prior smoking history.  Use the inhaler as needed for cough and shortness of breath.  Continue other medications as prescribed previously.

## 2023-01-18 NOTE — ED Provider Notes (Signed)
 [place] EMERGENCY DEPARTMENT AT [facility] Provider Note   CSN: [id number] Arrival date & time: 01/17/23  1637     History  Chief Complaint  Patient presents with   Skin Issue   Cough    [name] is a 68 y.o. female.  HPI     This is a 68 year old female with a history of hypertension, cirrhosis, DVT who presents with concerns for shortness of breath, cough, skin issue.  Patient had a recent hospitalization where she was admitted for hepatic encephalopathy.  At that time her son noted that she was diuresed and since that has developed some irritation in the bilateral thighs.  He is also concerned she may have a yeast infection.  Since being in the hospital she has developed a cough.  It is nonproductive.  She states over the last 24 hours she has had increasing shortness of breath.  She denies any history of COPD or asthma but has a significant smoking history.  She has not had any fevers.  Reportedly her son tested negative for COVID and influenza.  She had a telehealth visit yesterday when she was given a cough medication which has seemed to help some.  She also was given a prescription for yeast infection.  She has taken 1 pill.  Home Medications Prior to Admission medications   Medication Sig Start Date End Date Taking? Authorizing Provider  atorvastatin  (LIPITOR) 40 MG tablet Take 40 mg by mouth in the morning. 01/19/22   [provider]  colchicine  0.6 MG tablet Take 0.6 mg by mouth daily as needed (gout flares).    [provider]  cyanocobalamin  (VITAMIN B12) 1000 MCG tablet Take 2,000 mcg by mouth every Friday.    [provider]  dicyclomine  (BENTYL ) 10 MG capsule Take 1 capsule (10 mg total) by mouth 3 (three) times daily as needed for spasms (abdominal cramping). 09/09/22   Patsy Lenis, MD  EPINEPHrine  0.3 mg/0.3 mL IJ SOAJ injection Inject 0.3 mg into the muscle as needed for anaphylaxis. 08/07/19   [provider]   famotidine  (PEPCID ) 20 MG tablet Take 20 mg by mouth in the morning.    [provider]  folic acid  (FOLVITE ) 1 MG tablet Take 1 tablet (1 mg total) by mouth daily. 06/16/22   Rizwan, [name], MD  furosemide  (LASIX ) 40 MG tablet Take 1 tablet (40 mg total) by mouth daily. Take additional 40 mg if there is a weight gain of 3 lbs in1 day or 5 lbs in 1 week. 12/17/22   Odell Celinda Balo, MD  HYDROcodone -acetaminophen  (NORCO) 10-325 MG tablet Take 1 tablet by mouth every 8 (eight) hours as needed for moderate pain (pain score 4-6). 12/19/22   [provider]  IRON -VITAMIN C  PO Take 1 tablet by mouth daily with breakfast.    [provider]  lactulose  (CHRONULAC ) 10 GM/15ML solution Take 30 mLs (20 g total) by mouth 3 (three) times daily. Take to ensure that there is 2 loose bowel movement EVERY DAY Patient taking differently: Take 30 mLs by mouth 3 (three) times daily as needed (to ensure there are 2 loose bowel movements EVERY DAY). 10/31/22   Tobie Yetta HERO, MD  levothyroxine  (SYNTHROID ) 150 MCG tablet Take 150 mcg by mouth daily before breakfast. 10/26/21   [provider]  lidocaine  (LIDODERM ) 5 % Place 1 patch onto the skin daily. Remove & Discard patch within 12 hours or as directed by MD Patient taking differently: Place 1 patch  onto the skin daily as needed (for pain- Remove & Discard patch within 12 hours or as directed by MD). 10/31/22   Tobie Yetta HERO, MD  megestrol  (MEGACE ) 40 MG tablet Take 1 tablet (40 mg total) by mouth 2 (two) times daily. 01/13/23   Jadine Toribio SQUIBB, MD  methocarbamol  (ROBAXIN ) 500 MG tablet Take 500 mg by mouth in the morning. 03/04/22   [provider]  midodrine  (PROAMATINE ) 10 MG tablet Take 1 tablet (10 mg total) by mouth 3 (three) times daily with meals. 09/09/22   Patsy Lenis, MD  naloxone [facility]) nasal spray 4 mg/0.1 mL Place 1 spray into the nose as needed (accidental overdose). 03/25/22   [provider]   nystatin  cream (MYCOSTATIN ) Apply topically 2 (two) times daily. 01/13/23   Jadine Toribio SQUIBB, MD  nystatin  powder Apply 1 Application topically 3 (three) times daily as needed (for irritation- affected areas).    [provider]  pantoprazole  (PROTONIX ) 40 MG tablet Take 1 tablet (40 mg total) by mouth 2 (two) times daily before a meal. 09/09/22   Patsy Lenis, MD  promethazine  (PHENERGAN ) 12.5 MG tablet Take 12.5 mg by mouth every 6 (six) hours as needed for nausea or vomiting.    [provider]  rifaximin  (XIFAXAN ) 550 MG TABS tablet Take 550 mg by mouth 2 (two) times daily.    [provider]  rOPINIRole  (REQUIP ) 0.25 MG tablet Take 1 tablet (0.25 mg total) by mouth at bedtime. Patient taking differently: Take 0.25 mg by mouth daily. 09/09/22   Patsy Lenis, MD  spironolactone  (ALDACTONE ) 50 MG tablet Take 1 tablet (50 mg total) by mouth daily. 12/17/22   Odell Celinda Balo, MD  tamsulosin  (FLOMAX ) 0.4 MG CAPS capsule Take 1 capsule (0.4 mg total) by mouth daily. 09/10/22   Patsy Lenis, MD  triamcinolone  cream (KENALOG ) 0.5 % Apply to affected area of right breast twice daily as directed. Patient taking differently: Apply 1 Application topically 2 (two) times daily as needed (for irritation- right breast). 05/10/22   Krishnan, Gokul, MD  TYLENOL  500 MG tablet Take 1,000 mg by mouth every 6 (six) hours as needed for mild pain (pain score 1-3) or headache.    [provider]  venlafaxine  XR (EFFEXOR -XR) 75 MG 24 hr capsule Take 75 mg by mouth daily with breakfast. 08/17/21   [provider]  Vitamin D , Ergocalciferol , (DRISDOL ) 50000 UNITS CAPS Take 50,000 Units by mouth every Wednesday.    [provider]      Allergies    Ace inhibitors, Ivp dye [iodinated contrast media], Desitin [zinc  oxide], Gadolinium derivatives, Hydroxyzine , Chlorhexidine , Doxycycline, Penicillins, and Zofran  [ondansetron ]    Review of Systems   Review of Systems   Constitutional:  Negative for fever.  Respiratory:  Positive for cough and shortness of breath.   Skin:  Positive for rash.  All other systems reviewed and are negative.   Physical Exam Updated Vital Signs BP 103/88   Pulse 96   Temp 98.1 F (36.7 C) (Oral)   Resp 18   SpO2 98%  Physical Exam Vitals and nursing note reviewed.  Constitutional:      Appearance: She is well-developed. She is obese.     Comments: Chronically ill-appearing but nontoxic  HENT:     Head: Normocephalic and atraumatic.  Eyes:     Pupils: Pupils are equal, round, and reactive to light.     Comments: Haziness left cornea  Cardiovascular:     Rate and  Rhythm: Normal rate and regular rhythm.     Heart sounds: Normal heart sounds.  Pulmonary:     Effort: Pulmonary effort is normal. No respiratory distress.     Breath sounds: Wheezing present.     Comments: Slight tachypnea noted, fair air movement with expiratory wheezing noted Abdominal:     General: Bowel sounds are normal.     Palpations: Abdomen is soft.  Musculoskeletal:     Cervical back: Neck supple.  Skin:    General: Skin is warm and dry.     Comments: Erythema noted to bilateral inner thighs without skin breakdown  Neurological:     Mental Status: She is alert and oriented to person, place, and time.  Psychiatric:        Mood and Affect: Mood normal.     ED Results / Procedures / Treatments   Labs (all labs ordered are listed, but only abnormal results are displayed) Labs Reviewed  COMPREHENSIVE METABOLIC PANEL - Abnormal; Notable for the following components:      Result Value   Sodium 133 (*)    CO2 18 (*)    Creatinine, Ser 1.18 (*)    Calcium  8.3 (*)    Total Protein 6.0 (*)    Albumin  2.9 (*)    Total Bilirubin 1.9 (*)    GFR, Estimated 51 (*)    All other components within normal limits  CBC WITH DIFFERENTIAL/PLATELET - Abnormal; Notable for the following components:   RBC 3.04 (*)    Hemoglobin 9.0 (*)    HCT 28.4  (*)    RDW 17.5 (*)    Eosinophils Absolute 0.7 (*)    All other components within normal limits  URINALYSIS, ROUTINE W REFLEX MICROSCOPIC - Abnormal; Notable for the following components:   APPearance CLOUDY (*)    Hgb urine dipstick MODERATE (*)    Leukocytes,Ua SMALL (*)    Bacteria, UA RARE (*)    All other components within normal limits  RESP PANEL BY RT-PCR (RSV, FLU A&B, COVID)  RVPGX2  CBG MONITORING, ED    EKG EKG Interpretation Date/Time:  Tuesday January 17 2023 17:28:20 EST Ventricular Rate:  83 PR Interval:  185 QRS Duration:  94 QT Interval:  391 QTC Calculation: 460 R Axis:   -14  Text Interpretation: Sinus rhythm Inferior infarct, old Anterior infarct, old Confirmed by Bari Pfeiffer ([id number]) on 01/18/2023 12:05:56 AM  Radiology DG Chest Port 1 View Result Date: 01/17/2023 CLINICAL DATA:  Cough EXAM: PORTABLE CHEST 1 VIEW COMPARISON:  Chest x-ray 01/02/2023 FINDINGS: Heart is enlarged. The lungs are clear. There is no pleural effusion or pneumothorax. No acute fractures are seen. IMPRESSION: Cardiomegaly. No acute cardiopulmonary process. Electronically Signed   By: Greig Pique M.D.   On: 01/17/2023 17:50    Procedures Procedures    Medications Ordered in ED Medications  albuterol  (VENTOLIN  HFA) 108 (90 Base) MCG/ACT inhaler 2 puff (2 puffs Inhalation Given 01/18/23 0041)    ED Course/ Medical Decision Making/ A&P                                 Medical Decision Making Amount and/or Complexity of Data Reviewed Labs: ordered.  Risk Prescription drug management.   This patient presents to the ED for concern of shortness of breath, cough, this involves an extensive number of treatment options, and is a complaint that carries with it a high risk of complications  and morbidity.  I considered the following differential and admission for this acute, potentially life threatening condition.  The differential diagnosis includes pneumonia, viral illness such as  COVID or influenza, bronchitis, pneumothorax,  MDM:    This is a 68 year old female who presents with cough and shortness of breath.  She is nontoxic-appearing and vital signs are reassuring.  She is afebrile.  She is chronically ill-appearing.  She does have some wheezing on exam.  Denies history of COPD but does have significant smoking history.  Patient was given an inhaler.  Labs obtained and reviewed.  Largely reassuring.  Slight bump in creatinine to 1.18.  COVID and influenza testing are negative.  Chest x-ray without evidence of pneumonia.  EKG shows no evidence of acute ischemia or arrhythmia.  Patient has appropriate medications for early candidal infection although her skin mostly appears just irritated and might be related to chafing.  Recommend that she use the inhaler as needed.  She has maintain pulse ox greater than 96% while in the emergency department.  (Labs, imaging, consults)  Labs: I Ordered, and personally interpreted labs.  The pertinent results include: CBC, BMP, COVID, influenza, urinalysis  Imaging Studies ordered: I ordered imaging studies including chest x-ray I independently visualized and interpreted imaging. I agree with the radiologist interpretation  Additional history obtained from son at bedside.  External records from outside source obtained and reviewed including recent discharge summary  Cardiac Monitoring: The patient was maintained on a cardiac monitor.  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   pt stated on admission history review   Corneal perforation of left eye 04/18/2022   Depression, major, recurrent, moderate (HCC) 09/10/2018   DVT (deep venous thrombosis) (HCC) 01/10/1997   left      left     Heart murmur    Hyperammonemia (HCC) 09/30/2022   Hypertension    Kidney stone    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   Obesity    Pulmonary emboli (HCC) 01/10/2009   Pulmonary embolism (HCC)    Short gut syndrome    Thyroid  disease      Medicines Meds ordered this encounter  Medications   albuterol  (VENTOLIN  HFA) 108 (90 Base) MCG/ACT inhaler 2 puff    I have reviewed the patients home medicines and have made adjustments as needed  Problem List / ED Course: Problem List Items Addressed This Visit   None Visit Diagnoses       Viral URI with cough    -  Primary     Skin irritation                       Final Clinical Impression(s) / ED Diagnoses Final diagnoses:  Viral URI with cough  Skin irritation    Rx / DC Orders ED Discharge Orders     None         Bari Charmaine FALCON, MD 01/18/23 [phone]

## 2023-02-06 ENCOUNTER — Emergency Department (HOSPITAL_COMMUNITY): Payer: Medicare (Managed Care)

## 2023-02-06 ENCOUNTER — Encounter (HOSPITAL_COMMUNITY): Payer: Self-pay

## 2023-02-06 ENCOUNTER — Inpatient Hospital Stay (HOSPITAL_COMMUNITY)
Admission: EM | Admit: 2023-02-06 | Discharge: 2023-02-09 | DRG: 442 | Disposition: A | Discharge status: Home / Self Care | Payer: Medicare (Managed Care) | Attending: Internal Medicine | Admitting: Internal Medicine

## 2023-02-06 ENCOUNTER — Other Ambulatory Visit: Payer: Self-pay

## 2023-02-06 DIAGNOSIS — K7460 Unspecified cirrhosis of liver: Secondary | ICD-10-CM | POA: Diagnosis present

## 2023-02-06 DIAGNOSIS — Z86718 Personal history of other venous thrombosis and embolism: Secondary | ICD-10-CM | POA: Diagnosis not present

## 2023-02-06 DIAGNOSIS — Z66 Do not resuscitate: Secondary | ICD-10-CM | POA: Diagnosis present

## 2023-02-06 DIAGNOSIS — E66813 Obesity, class 3: Secondary | ICD-10-CM | POA: Diagnosis present

## 2023-02-06 DIAGNOSIS — E669 Obesity, unspecified: Secondary | ICD-10-CM | POA: Diagnosis present

## 2023-02-06 DIAGNOSIS — R278 Other lack of coordination: Secondary | ICD-10-CM | POA: Diagnosis present

## 2023-02-06 DIAGNOSIS — K7581 Nonalcoholic steatohepatitis (NASH): Secondary | ICD-10-CM | POA: Diagnosis present

## 2023-02-06 DIAGNOSIS — Z79890 Hormone replacement therapy (postmenopausal): Secondary | ICD-10-CM

## 2023-02-06 DIAGNOSIS — E785 Hyperlipidemia, unspecified: Secondary | ICD-10-CM | POA: Diagnosis present

## 2023-02-06 DIAGNOSIS — K7290 Hepatic failure, unspecified without coma: Secondary | ICD-10-CM | POA: Diagnosis not present

## 2023-02-06 DIAGNOSIS — F331 Major depressive disorder, recurrent, moderate: Secondary | ICD-10-CM | POA: Diagnosis present

## 2023-02-06 DIAGNOSIS — E039 Hypothyroidism, unspecified: Secondary | ICD-10-CM | POA: Diagnosis present

## 2023-02-06 DIAGNOSIS — E7220 Disorder of urea cycle metabolism, unspecified: Secondary | ICD-10-CM | POA: Diagnosis present

## 2023-02-06 DIAGNOSIS — Z87442 Personal history of urinary calculi: Secondary | ICD-10-CM

## 2023-02-06 DIAGNOSIS — I959 Hypotension, unspecified: Secondary | ICD-10-CM | POA: Diagnosis present

## 2023-02-06 DIAGNOSIS — Z87440 Personal history of urinary (tract) infections: Secondary | ICD-10-CM

## 2023-02-06 DIAGNOSIS — K7682 Hepatic encephalopathy: Principal | ICD-10-CM | POA: Diagnosis present

## 2023-02-06 DIAGNOSIS — Z881 Allergy status to other antibiotic agents status: Secondary | ICD-10-CM

## 2023-02-06 DIAGNOSIS — Z880 Allergy status to penicillin: Secondary | ICD-10-CM | POA: Diagnosis not present

## 2023-02-06 DIAGNOSIS — Z91041 Radiographic dye allergy status: Secondary | ICD-10-CM

## 2023-02-06 DIAGNOSIS — Z6841 Body Mass Index (BMI) 40.0 and over, adult: Secondary | ICD-10-CM | POA: Diagnosis not present

## 2023-02-06 DIAGNOSIS — Z79899 Other long term (current) drug therapy: Secondary | ICD-10-CM

## 2023-02-06 DIAGNOSIS — Z9049 Acquired absence of other specified parts of digestive tract: Secondary | ICD-10-CM

## 2023-02-06 DIAGNOSIS — Z87891 Personal history of nicotine dependence: Secondary | ICD-10-CM | POA: Diagnosis not present

## 2023-02-06 DIAGNOSIS — I9589 Other hypotension: Secondary | ICD-10-CM | POA: Diagnosis present

## 2023-02-06 DIAGNOSIS — G2581 Restless legs syndrome: Secondary | ICD-10-CM | POA: Diagnosis present

## 2023-02-06 DIAGNOSIS — Z888 Allergy status to other drugs, medicaments and biological substances status: Secondary | ICD-10-CM

## 2023-02-06 DIAGNOSIS — I10 Essential (primary) hypertension: Secondary | ICD-10-CM | POA: Diagnosis present

## 2023-02-06 DIAGNOSIS — N939 Abnormal uterine and vaginal bleeding, unspecified: Secondary | ICD-10-CM | POA: Diagnosis present

## 2023-02-06 DIAGNOSIS — Z86711 Personal history of pulmonary embolism: Secondary | ICD-10-CM

## 2023-02-06 DIAGNOSIS — R4182 Altered mental status, unspecified: Secondary | ICD-10-CM | POA: Diagnosis present

## 2023-02-06 LAB — URINALYSIS, ROUTINE W REFLEX MICROSCOPIC
Bacteria, UA: NONE SEEN
Bilirubin Urine: NEGATIVE
Glucose, UA: NEGATIVE mg/dL
Hgb urine dipstick: NEGATIVE
Ketones, ur: NEGATIVE mg/dL
Nitrite: NEGATIVE
Protein, ur: NEGATIVE mg/dL
Specific Gravity, Urine: 1.024 (ref 1.005–1.030)
pH: 5 (ref 5.0–8.0)

## 2023-02-06 LAB — CBC WITH DIFFERENTIAL/PLATELET
Abs Immature Granulocytes: 0.02 10*3/uL (ref 0.00–0.07)
Basophils Absolute: 0.1 10*3/uL (ref 0.0–0.1)
Basophils Relative: 1 %
Eosinophils Absolute: 0.5 10*3/uL (ref 0.0–0.5)
Eosinophils Relative: 7 %
HCT: 35 % — ABNORMAL LOW (ref 36.0–46.0)
Hemoglobin: 10.6 g/dL — ABNORMAL LOW (ref 12.0–15.0)
Immature Granulocytes: 0 %
Lymphocytes Relative: 29 %
Lymphs Abs: 2 10*3/uL (ref 0.7–4.0)
MCH: 28.6 pg (ref 26.0–34.0)
MCHC: 30.3 g/dL (ref 30.0–36.0)
MCV: 94.3 fL (ref 80.0–100.0)
Monocytes Absolute: 0.6 10*3/uL (ref 0.1–1.0)
Monocytes Relative: 9 %
Neutro Abs: 3.7 10*3/uL (ref 1.7–7.7)
Neutrophils Relative %: 54 %
Platelets: 151 10*3/uL (ref 150–400)
RBC: 3.71 MIL/uL — ABNORMAL LOW (ref 3.87–5.11)
RDW: 17.1 % — ABNORMAL HIGH (ref 11.5–15.5)
WBC: 6.9 10*3/uL (ref 4.0–10.5)
nRBC: 0 % (ref 0.0–0.2)

## 2023-02-06 LAB — COMPREHENSIVE METABOLIC PANEL
ALT: 20 U/L (ref 0–44)
AST: 28 U/L (ref 15–41)
Albumin: 3 g/dL — ABNORMAL LOW (ref 3.5–5.0)
Alkaline Phosphatase: 118 U/L (ref 38–126)
Anion gap: 9 (ref 5–15)
BUN: 20 mg/dL (ref 8–23)
CO2: 20 mmol/L — ABNORMAL LOW (ref 22–32)
Calcium: 8.7 mg/dL — ABNORMAL LOW (ref 8.9–10.3)
Chloride: 110 mmol/L (ref 98–111)
Creatinine, Ser: 0.96 mg/dL (ref 0.44–1.00)
GFR, Estimated: >60 mL/min (ref >60)
Glucose, Bld: 119 mg/dL — ABNORMAL HIGH (ref 70–99)
Potassium: 4 mmol/L (ref 3.5–5.1)
Sodium: 139 mmol/L (ref 135–145)
Total Bilirubin: 2.2 mg/dL — ABNORMAL HIGH (ref 0.0–1.2)
Total Protein: 6.2 g/dL — ABNORMAL LOW (ref 6.5–8.1)

## 2023-02-06 LAB — CBG MONITORING, ED: Glucose-Capillary: 91 mg/dL (ref 70–99)

## 2023-02-06 LAB — PROTIME-INR
INR: 1.5 — ABNORMAL HIGH (ref 0.8–1.2)
Prothrombin Time: 18.5 s — ABNORMAL HIGH (ref 11.4–15.2)

## 2023-02-06 LAB — AMMONIA: Ammonia: 108 umol/L — ABNORMAL HIGH (ref 9–35)

## 2023-02-06 NOTE — ED Provider Triage Note (Signed)
Emergency Medicine Provider Triage Evaluation Note  [name] , a 68 y.o. female  was evaluated in triage.  Pt complains of dysuria, weakness, altered mental status.  Patient became altered today according to son in the room.  Patient states that she has had some dysuria over the past few days but denies fevers nausea vomiting, hematemesis, bowel changes.  Patient does not know where she is at or what year it is but does know her name.  Patient is unable to give further history due to altered state.  Review of Systems  Positive:  Negative:   Physical Exam  BP 138/66 (BP Location: Left Arm)   Pulse 89   Temp 98.8 F (37.1 C) (Oral)   Resp 18   Ht 5\' 2"  (1.575 m)   Wt 107.6 kg   SpO2 99%   BMI 43.39 kg/m  Gen:   Awake, no distress   Resp:  Normal effort  MSK:   Moves extremities without difficulty  Other:  Skin does not appear jaundiced and there is no scleral icterus on exam, no abdominal tenderness or CVA tenderness, abdomen none tender and no peritoneal signs or distention  Medical Decision Making  Medically screening exam initiated at 6:30 PM.  Appropriate orders placed.  [name] was informed that the remainder of the evaluation will be completed by another provider, this initial triage assessment does not replace that evaluation, and the importance of remaining in the ED until their evaluation is complete.  Workup initiated, patient stable at this time.   Netta Corrigan, PA-C 02/06/23 1831

## 2023-02-06 NOTE — ED Triage Notes (Signed)
Patient reports burning with urination, altered mental status, and weakness starting this AM. Denies chest pain and SOB. A&Ox2 at time of triage. VSS, NAD

## 2023-02-07 ENCOUNTER — Encounter (HOSPITAL_COMMUNITY): Payer: Self-pay | Admitting: Family Medicine

## 2023-02-07 DIAGNOSIS — E7220 Disorder of urea cycle metabolism, unspecified: Secondary | ICD-10-CM | POA: Diagnosis present

## 2023-02-07 DIAGNOSIS — K7290 Hepatic failure, unspecified without coma: Secondary | ICD-10-CM

## 2023-02-07 DIAGNOSIS — F331 Major depressive disorder, recurrent, moderate: Secondary | ICD-10-CM

## 2023-02-07 DIAGNOSIS — Z880 Allergy status to penicillin: Secondary | ICD-10-CM | POA: Diagnosis not present

## 2023-02-07 DIAGNOSIS — Z86711 Personal history of pulmonary embolism: Secondary | ICD-10-CM | POA: Diagnosis not present

## 2023-02-07 DIAGNOSIS — I9589 Other hypotension: Secondary | ICD-10-CM | POA: Diagnosis present

## 2023-02-07 DIAGNOSIS — E669 Obesity, unspecified: Secondary | ICD-10-CM | POA: Diagnosis present

## 2023-02-07 DIAGNOSIS — Z87440 Personal history of urinary (tract) infections: Secondary | ICD-10-CM | POA: Diagnosis not present

## 2023-02-07 DIAGNOSIS — K7682 Hepatic encephalopathy: Secondary | ICD-10-CM

## 2023-02-07 DIAGNOSIS — K7581 Nonalcoholic steatohepatitis (NASH): Secondary | ICD-10-CM | POA: Diagnosis present

## 2023-02-07 DIAGNOSIS — R4182 Altered mental status, unspecified: Secondary | ICD-10-CM | POA: Diagnosis present

## 2023-02-07 DIAGNOSIS — E66813 Obesity, class 3: Secondary | ICD-10-CM | POA: Diagnosis present

## 2023-02-07 DIAGNOSIS — Z79890 Hormone replacement therapy (postmenopausal): Secondary | ICD-10-CM | POA: Diagnosis not present

## 2023-02-07 DIAGNOSIS — Z66 Do not resuscitate: Secondary | ICD-10-CM | POA: Diagnosis present

## 2023-02-07 DIAGNOSIS — Z6841 Body Mass Index (BMI) 40.0 and over, adult: Secondary | ICD-10-CM | POA: Diagnosis not present

## 2023-02-07 DIAGNOSIS — E039 Hypothyroidism, unspecified: Secondary | ICD-10-CM | POA: Diagnosis present

## 2023-02-07 DIAGNOSIS — Z87442 Personal history of urinary calculi: Secondary | ICD-10-CM | POA: Diagnosis not present

## 2023-02-07 DIAGNOSIS — I10 Essential (primary) hypertension: Secondary | ICD-10-CM | POA: Diagnosis present

## 2023-02-07 DIAGNOSIS — K7460 Unspecified cirrhosis of liver: Secondary | ICD-10-CM

## 2023-02-07 DIAGNOSIS — G2581 Restless legs syndrome: Secondary | ICD-10-CM | POA: Diagnosis present

## 2023-02-07 DIAGNOSIS — Z9049 Acquired absence of other specified parts of digestive tract: Secondary | ICD-10-CM | POA: Diagnosis not present

## 2023-02-07 DIAGNOSIS — Z87891 Personal history of nicotine dependence: Secondary | ICD-10-CM | POA: Diagnosis not present

## 2023-02-07 DIAGNOSIS — N939 Abnormal uterine and vaginal bleeding, unspecified: Secondary | ICD-10-CM | POA: Diagnosis present

## 2023-02-07 DIAGNOSIS — E785 Hyperlipidemia, unspecified: Secondary | ICD-10-CM | POA: Diagnosis present

## 2023-02-07 DIAGNOSIS — R278 Other lack of coordination: Secondary | ICD-10-CM | POA: Diagnosis present

## 2023-02-07 DIAGNOSIS — Z86718 Personal history of other venous thrombosis and embolism: Secondary | ICD-10-CM | POA: Diagnosis not present

## 2023-02-07 MED ORDER — PROCHLORPERAZINE EDISYLATE 10 MG/2ML IJ SOLN: 5.0000 mg | Freq: Four times a day (QID) | INTRAMUSCULAR | Status: DC | PRN

## 2023-02-07 MED ORDER — PANTOPRAZOLE SODIUM 40 MG PO TBEC
40.0000 mg | DELAYED_RELEASE_TABLET | Freq: Two times a day (BID) | ORAL | Status: DC
  Administered 2023-02-07 – 2023-02-09 (×5): 40 mg via ORAL
  Filled 2023-02-07 (×5): qty 1

## 2023-02-07 MED ORDER — LEVOTHYROXINE SODIUM 75 MCG PO TABS
150.0000 ug | ORAL_TABLET | Freq: Every day | ORAL | Status: DC
  Administered 2023-02-07 – 2023-02-09 (×3): 150 ug via ORAL
  Filled 2023-02-07 (×2): qty 2
  Filled 2023-02-07: qty 1

## 2023-02-07 MED ORDER — IPRATROPIUM-ALBUTEROL 0.5-2.5 (3) MG/3ML IN SOLN: 3.0000 mL | RESPIRATORY_TRACT | Status: DC | PRN

## 2023-02-07 MED ORDER — ACETAMINOPHEN 650 MG RE SUPP
650.0000 mg | Freq: Four times a day (QID) | RECTAL | Status: DC | PRN
Start: 2023-02-07 — End: 2023-02-09

## 2023-02-07 MED ORDER — TRAZODONE HCL 50 MG PO TABS
50.0000 mg | ORAL_TABLET | Freq: Every evening | ORAL | Status: DC | PRN
  Administered 2023-02-08 (×2): 50 mg via ORAL
  Filled 2023-02-07 (×2): qty 1

## 2023-02-07 MED ORDER — ACETAMINOPHEN 325 MG PO TABS
650.0000 mg | ORAL_TABLET | Freq: Four times a day (QID) | ORAL | Status: DC | PRN
  Administered 2023-02-08: 650 mg via ORAL
  Filled 2023-02-07: qty 2

## 2023-02-07 MED ORDER — METOPROLOL TARTRATE 5 MG/5ML IV SOLN: 5.0000 mg | INTRAVENOUS | Status: DC | PRN

## 2023-02-07 MED ORDER — TAMSULOSIN HCL 0.4 MG PO CAPS
0.4000 mg | ORAL_CAPSULE | Freq: Every day | ORAL | Status: DC
  Administered 2023-02-07 – 2023-02-09 (×3): 0.4 mg via ORAL
  Filled 2023-02-07 (×3): qty 1

## 2023-02-07 MED ORDER — MEGESTROL ACETATE 40 MG PO TABS
40.0000 mg | ORAL_TABLET | Freq: Two times a day (BID) | ORAL | Status: DC
  Administered 2023-02-07 – 2023-02-09 (×5): 40 mg via ORAL
  Filled 2023-02-07 (×5): qty 1

## 2023-02-07 MED ORDER — ATORVASTATIN CALCIUM 40 MG PO TABS
40.0000 mg | ORAL_TABLET | Freq: Every day | ORAL | Status: DC
  Administered 2023-02-07 – 2023-02-09 (×3): 40 mg via ORAL
  Filled 2023-02-07 (×3): qty 1

## 2023-02-07 MED ORDER — SPIRONOLACTONE 25 MG PO TABS
50.0000 mg | ORAL_TABLET | Freq: Every day | ORAL | Status: DC
  Administered 2023-02-07 – 2023-02-09 (×3): 50 mg via ORAL
  Filled 2023-02-07 (×3): qty 2

## 2023-02-07 MED ORDER — FUROSEMIDE 40 MG PO TABS
40.0000 mg | ORAL_TABLET | Freq: Every day | ORAL | Status: DC
  Administered 2023-02-07 – 2023-02-09 (×3): 40 mg via ORAL
  Filled 2023-02-07 (×3): qty 1

## 2023-02-07 MED ORDER — MIDODRINE HCL 5 MG PO TABS
10.0000 mg | ORAL_TABLET | Freq: Three times a day (TID) | ORAL | Status: DC
  Administered 2023-02-07 – 2023-02-09 (×5): 10 mg via ORAL
  Filled 2023-02-07 (×6): qty 2

## 2023-02-07 MED ORDER — ENOXAPARIN SODIUM 60 MG/0.6ML IJ SOSY
50.0000 mg | PREFILLED_SYRINGE | INTRAMUSCULAR | Status: DC
  Administered 2023-02-07 – 2023-02-09 (×3): 50 mg via SUBCUTANEOUS
  Filled 2023-02-07 (×3): qty 0.6

## 2023-02-07 MED ORDER — NYSTATIN 100000 UNIT/ML MT SUSP
5.0000 mL | Freq: Four times a day (QID) | OROMUCOSAL | Status: DC
  Administered 2023-02-07 – 2023-02-09 (×8): 500000 [IU] via ORAL
  Filled 2023-02-07 (×10): qty 5

## 2023-02-07 MED ORDER — LACTULOSE 10 GM/15ML PO SOLN
30.0000 g | Freq: Three times a day (TID) | ORAL | Status: DC
  Administered 2023-02-07 – 2023-02-09 (×7): 30 g via ORAL
  Filled 2023-02-07 (×2): qty 45
  Filled 2023-02-07: qty 60
  Filled 2023-02-07: qty 45
  Filled 2023-02-07: qty 60
  Filled 2023-02-07 (×3): qty 45

## 2023-02-07 MED ORDER — METHOCARBAMOL 500 MG PO TABS
500.0000 mg | ORAL_TABLET | Freq: Every morning | ORAL | Status: DC
  Administered 2023-02-07 – 2023-02-09 (×3): 500 mg via ORAL
  Filled 2023-02-07 (×3): qty 1

## 2023-02-07 MED ORDER — GUAIFENESIN 100 MG/5ML PO LIQD: 5.0000 mL | ORAL | Status: DC | PRN

## 2023-02-07 MED ORDER — RIFAXIMIN 550 MG PO TABS
550.0000 mg | ORAL_TABLET | Freq: Two times a day (BID) | ORAL | Status: DC
  Administered 2023-02-07 – 2023-02-09 (×5): 550 mg via ORAL
  Filled 2023-02-07 (×5): qty 1

## 2023-02-07 MED ORDER — VENLAFAXINE HCL ER 75 MG PO CP24
75.0000 mg | ORAL_CAPSULE | Freq: Every day | ORAL | Status: DC
  Administered 2023-02-07 – 2023-02-09 (×3): 75 mg via ORAL
  Filled 2023-02-07 (×3): qty 1

## 2023-02-07 MED ORDER — SENNOSIDES-DOCUSATE SODIUM 8.6-50 MG PO TABS: 1.0000 | ORAL_TABLET | Freq: Every evening | ORAL | Status: DC | PRN

## 2023-02-07 MED ORDER — ROPINIROLE HCL 0.25 MG PO TABS
0.2500 mg | ORAL_TABLET | Freq: Every day | ORAL | Status: DC
  Administered 2023-02-07 – 2023-02-08 (×2): 0.25 mg via ORAL
  Filled 2023-02-07 (×2): qty 1

## 2023-02-07 MED ORDER — HYDRALAZINE HCL 20 MG/ML IJ SOLN: 10.0000 mg | INTRAMUSCULAR | Status: DC | PRN

## 2023-02-07 NOTE — Progress Notes (Signed)
PROGRESS NOTE    [name]  ZOX:[id number] DOB: 12-02-1955 DOA: 02/06/2023 PCP: [name], MD    Brief Narrative:   68 year old with history of decompensated Nash cirrhosis, VTE not on anticoagulation, depression, hypothyroidism, prior colectomy, RLS, chronic hypotension on midodrine comes to the ER with worsening confusion.  Upon admission CT of the head is negative.  Ammonia was noted to be 108 therefore started on aggressive lactulose.  Assessment & Plan:  Principal Problem:   Hepatic encephalopathy (HCC) Active Problems:   Cirrhosis due to NASH   Hypothyroidism   Hypotension   Decompensated cirrhosis (HCC)   Depression, major, recurrent, moderate (HCC)     Acute hepatic encephalopathy Decompensated NASH cirrhosis -Admission ammonia 108.  Will continue aggressive lactulose to have at least 4-5 bowel movements daily for now or until mental status improves.  Thereafter should be having at least 3-4 soft bowel movements daily. -Hemoglobin, platelets are overall stable for now -Continue home Lasix, rifaximin, Aldactone   Chronic Hypotension  -Midodrine   Hypothyroidism  - Synthroid     Depression  - Effexor     AUB  - Megace    RLS  - Requip   Hyperlipidemia - Statin  Unfortunately patient continues to have recurrent hospitalizations secondary to acute encephalopathy.  This is her fifth admission in last 8 weeks.    DVT prophylaxis: Lovenox    Code Status: Limited: Do not attempt resuscitation (DNR) -DNR-LIMITED -Do Not Intubate/DNI  Family Communication:   Still confused continue lactulose aggressively    Subjective: Patient is awake but still quite confused.  Not able to answer all the questions correctly.  She is AAO x 1-2   Examination:  General exam: Appears calm and comfortable  Respiratory system: Clear to auscultation. Respiratory effort normal. Cardiovascular system: S1 & S2 heard, RRR. No JVD, murmurs, rubs, gallops or clicks. No  pedal edema. Gastrointestinal system: Abdomen is nondistended, soft and nontender. No organomegaly or masses felt. Normal bowel sounds heard. Central nervous system: Alert and oriented x 1-2 (baseline 3). No focal neurological deficits. Extremities: Symmetric 5 x 5 power. Skin: No rashes, lesions or ulcers Psychiatry: Judgement and insight appear normal. Mood & affect appropriate.                Diet Orders (From admission, onward)     Start     Ordered   02/07/23 0513  Diet Heart Room service appropriate? Yes; Fluid consistency: Thin  Diet effective now       Question Answer Comment  Room service appropriate? Yes   Fluid consistency: Thin      02/07/23 0513            Objective: Vitals:   02/06/23 2223 02/07/23 0150 02/07/23 0449 02/07/23 0845  BP: 132/68 (!) 137/59 132/78 (!) 98/46  Pulse:  78 78 92  Resp:  18 (!) 22 (!) 22  Temp:  98.1 F (36.7 C) 98.8 F (37.1 C) 98.3 F (36.8 C)  TempSrc:  Oral Oral Oral  SpO2:  100% 100% 96%  Weight:      Height:       No intake or output data in the 24 hours ending 02/07/23 1159 Filed Weights   02/06/23 1826  Weight: 107.6 kg    Scheduled Meds:  atorvastatin  40 mg Oral Daily   enoxaparin (LOVENOX) injection  50 mg Subcutaneous Q24H   furosemide  40 mg Oral Daily   lactulose  30 g Oral TID   levothyroxine  150 mcg Oral QAC breakfast   megestrol  40 mg Oral BID   methocarbamol  500 mg Oral q AM   midodrine  10 mg Oral TID WC   pantoprazole  40 mg Oral BID AC   rifaximin  550 mg Oral BID   rOPINIRole  0.25 mg Oral QHS   spironolactone  50 mg Oral Daily   tamsulosin  0.4 mg Oral Daily   venlafaxine XR  75 mg Oral Q breakfast   Continuous Infusions:  Nutritional status     Body mass index is 43.39 kg/m.  Data Reviewed:   CBC: Recent Labs  Lab 02/06/23 2217  WBC 6.9  NEUTROABS 3.7  HGB 10.6*  HCT 35.0*  MCV 94.3  PLT 151   Basic Metabolic Panel: Recent Labs  Lab 02/06/23 2217  NA 139   K 4.0  CL 110  CO2 20*  GLUCOSE 119*  BUN 20  CREATININE 0.96  CALCIUM 8.7*   GFR: Estimated Creatinine Clearance: 67.4 mL/min (by C-G formula based on SCr of 0.96 mg/dL). Liver Function Tests: Recent Labs  Lab 02/06/23 2217  AST 28  ALT 20  ALKPHOS 118  BILITOT 2.2*  PROT 6.2*  ALBUMIN 3.0*   No results for input(s): "LIPASE", "AMYLASE" in the last 168 hours. Recent Labs  Lab 02/06/23 2217  AMMONIA 108*   Coagulation Profile: Recent Labs  Lab 02/06/23 2217  INR 1.5*   Cardiac Enzymes: No results for input(s): "CKTOTAL", "CKMB", "CKMBINDEX", "TROPONINI" in the last 168 hours. BNP (last 3 results) No results for input(s): "PROBNP" in the last 8760 hours. HbA1C: No results for input(s): "HGBA1C" in the last 72 hours. CBG: Recent Labs  Lab 02/06/23 1908  GLUCAP 91   Lipid Profile: No results for input(s): "CHOL", "HDL", "LDLCALC", "TRIG", "CHOLHDL", "LDLDIRECT" in the last 72 hours. Thyroid Function Tests: No results for input(s): "TSH", "T4TOTAL", "FREET4", "T3FREE", "THYROIDAB" in the last 72 hours. Anemia Panel: No results for input(s): "VITAMINB12", "FOLATE", "FERRITIN", "TIBC", "IRON", "RETICCTPCT" in the last 72 hours. Sepsis Labs: No results for input(s): "PROCALCITON", "LATICACIDVEN" in the last 168 hours.  No results found for this or any previous visit (from the past 240 hours).       Radiology Studies: DG Chest Port 1 View Result Date: 02/06/2023 CLINICAL DATA:  Altered mental status, burning with urination, weakness EXAM: PORTABLE CHEST 1 VIEW COMPARISON:  01/17/2023 FINDINGS: Stable enlargement of the cardiomediastinal silhouette. Aortic atherosclerotic calcification. Low lung volumes accentuate pulmonary vascularity. Scarring/atelectasis in the left mid lung. Otherwise no focal consolidation, pleural effusion, or pneumothorax. No displaced rib fractures. IMPRESSION: Low lung volumes without acute cardiopulmonary disease. Electronically Signed    By: Minerva Fester M.D.   On: 02/06/2023 19:18   CT HEAD WO CONTRAST Result Date: 02/06/2023 CLINICAL DATA:  Burning with urination, altered mental status, weakness EXAM: CT HEAD WITHOUT CONTRAST TECHNIQUE: Contiguous axial images were obtained from the base of the skull through the vertex without intravenous contrast. RADIATION DOSE REDUCTION: This exam was performed according to the departmental dose-optimization program which includes automated exposure control, adjustment of the mA and/or kV according to patient size and/or use of iterative reconstruction technique. COMPARISON:  10/17/2022 FINDINGS: Brain: No evidence of acute infarction, hemorrhage, mass, mass effect, or midline shift. No hydrocephalus or extra-axial fluid collection. Periventricular white matter changes, likely the sequela of chronic small vessel ischemic disease. Vascular: No hyperdense vessel. Skull: Negative for fracture or focal lesion. Hyperostosis frontalis. Sinuses/Orbits: Mucosal thickening in the left ethmoid  air cells. No acute finding in the orbits. Status post left lens replacement. Other: The mastoid air cells are well aerated. IMPRESSION: No acute intracranial process. Electronically Signed   By: Wiliam Ke M.D.   On: 02/06/2023 19:09           LOS: 0 days   Time spent= 35 mins    Miguel Rota, MD [facility]  If 7PM-7AM, please contact night-coverage  02/07/2023, 11:59 AM

## 2023-02-07 NOTE — ED Provider Notes (Signed)
[facility]-EMERGENCY DEPT [facility] Emergency Department Provider Note MRN:  [id number]  Arrival date & time: 02/07/23     Chief Complaint   Altered Mental Status   History of Present Illness   [name] is a 68 y.o. year-old female presents to the ED with chief complaint of confusion.  She is BIB her son, who states that he has noticed gradually worsening confusion over the past couple of days.  He states that she has hx of hepatic encephalopathy.  States that she has been stumbling over her words and seems confused.  States that she thought her walker was the toilet earlier today.  States that she has been eating and drinking normally and has been taking her lactulose, but symptoms continue to worsen.  He states that she has had some itching, which he thought might be UTI.  He denies fever or recent illness.  Hx provided by son.   Review of Systems  Pertinent positive and negative review of systems noted in HPI.    Physical Exam   Vitals:   02/06/23 2223 02/07/23 0150  BP: 132/68 (!) 137/59  Pulse: 75 78  Resp: 18 18  Temp: 98.2 F (36.8 C) 98.1 F (36.7 C)  SpO2: 100% 100%    CONSTITUTIONAL:  chronically iill-appearing, NAD NEURO:  Alert and intermittently oriented to place and time, oriented to person, moves all extremities EYES:  eyes equal and reactive ENT/NECK:  Supple, no stridor  CARDIO:  normal rate, regular rhythm, appears well-perfused  PULM:  No respiratory distress, CTAB GI/GU:  non-distended, no focal tenderness MSK/SPINE:  No gross deformities, bilateral 2+ LE edema, moves all extremities  SKIN:  no rash, atraumatic   *Additional and/or pertinent findings included in MDM below  Diagnostic and Interventional Summary    EKG Interpretation Date/Time:    Ventricular Rate:    PR Interval:    QRS Duration:    QT Interval:    QTC Calculation:   R Axis:      Text Interpretation:         Labs Reviewed  COMPREHENSIVE METABOLIC PANEL -  Abnormal; Notable for the following components:      Result Value   CO2 20 (*)    Glucose, Bld 119 (*)    Calcium 8.7 (*)    Total Protein 6.2 (*)    Albumin 3.0 (*)    Total Bilirubin 2.2 (*)    All other components within normal limits  CBC WITH DIFFERENTIAL/PLATELET - Abnormal; Notable for the following components:   RBC 3.71 (*)    Hemoglobin 10.6 (*)    HCT 35.0 (*)    RDW 17.1 (*)    All other components within normal limits  URINALYSIS, ROUTINE W REFLEX MICROSCOPIC - Abnormal; Notable for the following components:   Color, Urine AMBER (*)    Leukocytes,Ua TRACE (*)    All other components within normal limits  PROTIME-INR - Abnormal; Notable for the following components:   Prothrombin Time 18.5 (*)    INR 1.5 (*)    All other components within normal limits  AMMONIA - Abnormal; Notable for the following components:   Ammonia 108 (*)    All other components within normal limits  CBG MONITORING, ED    CT HEAD WO CONTRAST  Final Result    DG Chest Port 1 View  Final Result      Medications - No data to display   Procedures  /  Critical Care Procedures  ED Course  and Medical Decision Making  I have reviewed the triage vital signs, the nursing notes, and pertinent available records from the EMR.  Social Determinants Affecting Complexity of Care: Patient has no clinically significant social determinants affecting this chief complaint..   ED Course: Clinical Course as of 02/07/23 0451  Tue Feb 07, 2023  0450 Ammonia(!) Ammonia of 108, with acutely worsening confusion, I suspect this is the cause of her encephalopathy [RB]  0450 CBC WITH DIFFERENTIAL(!) No leukocytosis to suggest infection [RB]  0450 Urinalysis, Routine w reflex microscopic -Urine, Clean Catch(!) UA inconsistent with infection [RB]    Clinical Course User Index [RB] Roxy Horseman, PA-C    Medical Decision Making Patient here with confusion that has been worsening over the past couple of  days.  This is a recurrent problem.  Son is concerned about her ammonia or UTI, which she has had in the past.    Labs are discussed below.  Feel that patient will likely need admission for acute encephalopathy.  Amount and/or Complexity of Data Reviewed Labs:  Decision-making details documented in ED Course.  Risk Decision regarding hospitalization.         Consultants: I consulted with Hospitalist, Dr. Antionette Char, who is appreciated for admitting.   Treatment and Plan: Patient's exam and diagnostic results are concerning for hepatic encephalopathy.  Feel that patient will need admission to the hospital for further treatment and evaluation.  Patient discussed with attending physician, Dr. Eudelia Bunch, who recommends admission.  Final Clinical Impressions(s) / ED Diagnoses     ICD-10-CM   1. Hepatic encephalopathy (HCC)  K76.82     2. Hyperammonemia (HCC)  E72.20       ED Discharge Orders     None         Discharge Instructions Discussed with and Provided to Patient:   Discharge Instructions   None      Roxy Horseman, PA-C 02/07/23 0505    Nira Conn, MD 02/07/23 [phone]

## 2023-02-07 NOTE — Plan of Care (Signed)
  Problem: Clinical Measurements: Goal: Will remain free from infection Outcome: Progressing Goal: Diagnostic test results will improve Outcome: Progressing Goal: Respiratory complications will improve Outcome: Progressing Goal: Cardiovascular complication will be avoided Outcome: Progressing   Problem: Activity: Goal: Risk for activity intolerance will decrease Outcome: Progressing   Problem: Nutrition: Goal: Adequate nutrition will be maintained Outcome: Progressing   Problem: Coping: Goal: Level of anxiety will decrease Outcome: Progressing   Problem: Elimination: Goal: Will not experience complications related to bowel motility Outcome: Progressing Goal: Will not experience complications related to urinary retention Outcome: Progressing   Problem: Pain Managment: Goal: General experience of comfort will improve and/or be controlled Outcome: Progressing   Problem: Safety: Goal: Ability to remain free from injury will improve Outcome: Progressing   Problem: Skin Integrity: Goal: Risk for impaired skin integrity will decrease Outcome: Progressing

## 2023-02-07 NOTE — Hospital Course (Addendum)
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  Upon admission CT of the head is negative.  Ammonia was noted to be 108 therefore started on aggressive lactulose. After aggressive lactulose, patient feels a whole lot better.  Mentation is better.  Son is at bedside.  Medically stable for discharge.  Assessment & Plan:  Principal Problem:   Hepatic encephalopathy (HCC) Active Problems:   Cirrhosis due to NASH   Hypothyroidism   Hypotension   Decompensated cirrhosis (HCC)   Depression, major, recurrent, moderate (HCC)     Acute hepatic encephalopathy, resolved Decompensated NASH cirrhosis -Admission ammonia 108 > 77.  Mentation is back to baseline, after lactulose this is resolved.  Son is present at bedside who confirms.  Will resume home medications.     Chronic Hypotension  -Midodrine   Hypothyroidism  - Synthroid     Depression  - Effexor     AUB  - Megace    RLS  - Requip   Hyperlipidemia - Statin  Unfortunately patient continues to have recurrent hospitalizations secondary to acute encephalopathy.  This is her fifth admission in last 8 weeks.  PT/OT-home health.  F2F completed  DVT prophylaxis: Lovenox    Code Status: Limited: Do not attempt resuscitation (DNR) -DNR-LIMITED -Do Not Intubate/DNI  Family Communication: Son at side Discharge   Subjective:  Feeling okay no complaints.  Would like to go home.  Examination:  General exam: Appears calm and comfortable  Respiratory system: Clear to auscultation. Respiratory effort normal. Cardiovascular system: S1 & S2 heard, RRR. No JVD, murmurs, rubs, gallops or clicks. No pedal edema. Gastrointestinal system: Abdomen is nondistended, soft and nontender. No organomegaly or masses felt. Normal bowel sounds heard. Central nervous  system: Alert and oriented x 2(baseline 3). No focal neurological deficits. Extremities: Symmetric 5 x 5 power. Skin: No rashes, lesions or ulcers Psychiatry: Judgement and insight appear normal. Mood & affect appropriate.

## 2023-02-07 NOTE — H&P (Signed)
History and Physical    [name] JWJ:[id number] DOB: [birth year] DOA: 02/06/2023  PCP: [name], MD   Patient coming from: Home   Chief Complaint: Confusion   HPI: [name] is a 68 y.o. female with medical history significant for decompensated MASH cirrhosis, history of VTE not not currently anticoagulated, depression, hypothyroidism, prior colectomy, restless leg syndrome, and chronic hypotension on midodrine who presents with worsening confusion.  Patient was brought into the ED by her son who reports noting progressive confusion and lethargy and the patient over the past couple days.  Patient states "I feel lost," but denies headache, focal numbness or weakness, fever or chills, melena or hematochezia, vomiting, or constipation.  ED Course: Upon arrival to the ED, patient is found to be afebrile and saturating well on room air with normal heart rate and stable blood pressure.  Labs are most notable for bilirubin 2.2, normal WBC, INR 1.5, and ammonia 108.  Head CT is negative for acute findings, as is chest x-ray.  Review of Systems:  ROS limited by patient's clinical condition.  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   Obesity    Pulmonary emboli (HCC) 01/10/2009   Pulmonary embolism (HCC)    Short gut syndrome    Thyroid disease     Past Surgical History:  Procedure Laterality Date   CESAREAN SECTION WITH BILATERAL TUBAL LIGATION     CHOLECYSTECTOMY     COLON SURGERY     ESOPHAGOGASTRODUODENOSCOPY  (EGD) WITH PROPOFOL N/A 10/19/2022   Procedure: ESOPHAGOGASTRODUODENOSCOPY (EGD) WITH PROPOFOL;  Surgeon: Kerin Salen, MD;  Location: [facility];  Service: Gastroenterology;  Laterality: N/A;   HERNIA REPAIR     ILEOSTOMY     ILEOSTOMY CLOSURE     IR KYPHO THORACIC WITH BONE BIOPSY  08/31/2022   IR KYPHO THORACIC WITH BONE BIOPSY  10/27/2022   IR RADIOLOGIST EVAL & MGMT  09/27/2022   KIDNEY STONE SURGERY     WRIST SURGERY      Social History:   reports that she quit smoking about 38 years ago. Her smoking use included cigarettes. She has been exposed to tobacco smoke. She has never used smokeless tobacco. She reports that she does not drink alcohol and does not use drugs.  Allergies  Allergen Reactions   Ace Inhibitors Anaphylaxis and Swelling   Ivp Dye [Iodinated Contrast Media] Anaphylaxis   Desitin [Zinc Oxide] Rash and Other (See Comments)    Worsens rash   Gadolinium Derivatives Other (See Comments)    Reaction??   Hydroxyzine Other (See Comments)    Affects the mind   Chlorhexidine Rash and Other (See Comments)    WORSENS RASHES   Doxycycline Rash   Penicillins Rash   Zofran [Ondansetron] Rash    History reviewed. No pertinent family history.   Prior to Admission medications   Medication Sig Start Date End Date Taking? Authorizing Provider  atorvastatin (LIPITOR) 40 MG tablet Take 40 mg by mouth in the  morning. 01/19/22   [provider]  colchicine 0.6 MG tablet Take 0.6 mg by mouth daily as needed (gout flares).    [provider]  cyanocobalamin (VITAMIN B12) 1000 MCG tablet Take 2,000 mcg by mouth every Friday.    [provider]  dicyclomine (BENTYL) 10 MG capsule Take 1 capsule (10 mg total) by mouth 3 (three) times daily as needed for spasms (abdominal cramping). 09/09/22   Lewie Chamber, MD  EPINEPHrine 0.3 mg/0.3 mL IJ SOAJ injection Inject 0.3 mg into the muscle as needed for anaphylaxis. 08/07/19   [provider]   famotidine (PEPCID) 20 MG tablet Take 20 mg by mouth in the morning.    [provider]  folic acid (FOLVITE) 1 MG tablet Take 1 tablet (1 mg total) by mouth daily. 06/16/22   Calvert Cantor, MD  furosemide (LASIX) 40 MG tablet Take 1 tablet (40 mg total) by mouth daily. Take additional 40 mg if there is a weight gain of 3 lbs in1 day or 5 lbs in 1 week. 12/17/22   Marinda Elk, MD  HYDROcodone-acetaminophen (NORCO) 10-325 MG tablet Take 1 tablet by mouth every 8 (eight) hours as needed for moderate pain (pain score 4-6). 12/19/22   [provider]  IRON-VITAMIN C PO Take 1 tablet by mouth daily with breakfast.    [provider]  lactulose (CHRONULAC) 10 GM/15ML solution Take 30 mLs (20 g total) by mouth 3 (three) times daily. Take to ensure that there is 2 loose bowel movement EVERY DAY Patient taking differently: Take 30 mLs by mouth 3 (three) times daily as needed (to ensure there are 2 loose bowel movements EVERY DAY). 10/31/22   Rolly Salter, MD  levothyroxine (SYNTHROID) 150 MCG tablet Take 150 mcg by mouth daily before breakfast. 10/26/21   [provider]  lidocaine (LIDODERM) 5 % Place 1 patch onto the skin daily. Remove & Discard patch within 12 hours or as directed by MD Patient taking differently: Place 1 patch onto the skin daily as needed (for pain- Remove & Discard patch within 12 hours or as directed by MD). 10/31/22   Rolly Salter, MD  megestrol (MEGACE) 40 MG tablet Take 1 tablet (40 mg total) by mouth 2 (two) times daily. 01/13/23   Standley Brooking, MD  methocarbamol (ROBAXIN) 500 MG tablet Take 500 mg by mouth in the morning. 03/04/22   [provider]  midodrine (PROAMATINE) 10 MG tablet Take 1 tablet (10 mg total) by mouth 3 (three) times daily with meals. 09/09/22   Lewie Chamber, MD  naloxone [facility]) nasal spray 4 mg/0.1 mL Place 1 spray into the nose as needed (accidental overdose). 03/25/22   [provider]   nystatin cream (MYCOSTATIN) Apply topically 2 (two) times daily. 01/13/23   Standley Brooking, MD  nystatin powder Apply 1 Application topically 3 (three) times daily as needed (for irritation- affected areas).    [provider]  pantoprazole (PROTONIX) 40 MG tablet Take 1 tablet (40 mg total) by mouth 2 (two) times daily before a meal. 09/09/22   Lewie Chamber, MD  promethazine (PHENERGAN) 12.5 MG tablet Take 12.5 mg by mouth every 6 (six) hours as needed for nausea or vomiting.    [provider]  rifaximin (XIFAXAN) 550 MG TABS tablet Take 550 mg by mouth 2 (two) times daily.    [provider]  rOPINIRole (REQUIP) 0.25 MG tablet Take 1 tablet (0.25 mg total) by mouth at  bedtime. Patient taking differently: Take 0.25 mg by mouth daily. 09/09/22   Lewie Chamber, MD  spironolactone (ALDACTONE) 50 MG tablet Take 1 tablet (50 mg total) by mouth daily. 12/17/22   Marinda Elk, MD  tamsulosin (FLOMAX) 0.4 MG CAPS capsule Take 1 capsule (0.4 mg total) by mouth daily. 09/10/22   Lewie Chamber, MD  triamcinolone cream (KENALOG) 0.5 % Apply to affected area of right breast twice daily as directed. Patient taking differently: Apply 1 Application topically 2 (two) times daily as needed (for irritation- right breast). 05/10/22   Osvaldo Shipper, MD  TYLENOL 500 MG tablet Take 1,000 mg by mouth every 6 (six) hours as needed for mild pain (pain score 1-3) or headache.    [provider]  venlafaxine XR (EFFEXOR-XR) 75 MG 24 hr capsule Take 75 mg by mouth daily with breakfast. 08/17/21   [provider]  Vitamin D, Ergocalciferol, (DRISDOL) 50000 UNITS CAPS Take 50,000 Units by mouth every Wednesday.    [provider]    Physical Exam: Vitals:   02/06/23 1826 02/06/23 2223 02/07/23 0150 02/07/23 0449  BP:  132/68 (!) 137/59 132/78  Pulse:  75 78 78  Resp:  18 18 (!) 22  Temp:  98.2 F (36.8 C) 98.1 F (36.7 C) 98.8 F (37.1 C)  TempSrc:  Oral  Oral Oral  SpO2:  100% 100% 100%  Weight: 107.6 kg     Height: 5\' 2"  (1.575 m)       Constitutional: NAD, no pallor or diaphoresis   Eyes: PERTLA, lids and conjunctivae normal ENMT: Mucous membranes are moist. Posterior pharynx clear of any exudate or lesions.   Neck: supple, no masses  Respiratory: no wheezing, no crackles. No accessory muscle use.  Cardiovascular: S1 & S2 heard, regular rate and rhythm. Mild lower extremity edema.   Abdomen: Soft, no guarding or rebound pain. Bowel sounds active.  Musculoskeletal: no clubbing / cyanosis. No joint deformity upper and lower extremities.   Skin: no significant rashes, lesions, ulcers. Warm, dry, well-perfused. Neurologic: Blind in left eye, CN II-XII grossly intact otherwise. Sensation to light touch intact. Moving all extremities. Sleeping. Wakes to voice and oriented to person and place. Asterixis noted.  Psychiatric: Calm. Cooperative.    Labs and Imaging on Admission: I have personally reviewed following labs and imaging studies  CBC: Recent Labs  Lab 02/06/23 2217  WBC 6.9  NEUTROABS 3.7  HGB 10.6*  HCT 35.0*  MCV 94.3  PLT 151   Basic Metabolic Panel: Recent Labs  Lab 02/06/23 2217  NA 139  K 4.0  CL 110  CO2 20*  GLUCOSE 119*  BUN 20  CREATININE 0.96  CALCIUM 8.7*   GFR: Estimated Creatinine Clearance: 67.4 mL/min (by C-G formula based on SCr of 0.96 mg/dL). Liver Function Tests: Recent Labs  Lab 02/06/23 2217  AST 28  ALT 20  ALKPHOS 118  BILITOT 2.2*  PROT 6.2*  ALBUMIN 3.0*   No results for input(s): "LIPASE", "AMYLASE" in the last 168 hours. Recent Labs  Lab 02/06/23 2217  AMMONIA 108*   Coagulation Profile: Recent Labs  Lab 02/06/23 2217  INR 1.5*   Cardiac Enzymes: No results for input(s): "CKTOTAL", "CKMB", "CKMBINDEX", "TROPONINI" in the last 168 hours. BNP (last 3 results) No results for input(s): "PROBNP" in the last 8760 hours. HbA1C: No results for input(s): "HGBA1C" in the  last 72 hours. CBG: Recent Labs  Lab 02/06/23 1908  GLUCAP 91   Lipid Profile: No results  for input(s): "CHOL", "HDL", "LDLCALC", "TRIG", "CHOLHDL", "LDLDIRECT" in the last 72 hours. Thyroid Function Tests: No results for input(s): "TSH", "T4TOTAL", "FREET4", "T3FREE", "THYROIDAB" in the last 72 hours. Anemia Panel: No results for input(s): "VITAMINB12", "FOLATE", "FERRITIN", "TIBC", "IRON", "RETICCTPCT" in the last 72 hours. Urine analysis:    Component Value Date/Time   COLORURINE AMBER (A) 02/06/2023 1841   APPEARANCEUR CLEAR 02/06/2023 1841   LABSPEC 1.024 02/06/2023 1841   PHURINE 5.0 02/06/2023 1841   GLUCOSEU NEGATIVE 02/06/2023 1841   HGBUR NEGATIVE 02/06/2023 1841   BILIRUBINUR NEGATIVE 02/06/2023 1841   KETONESUR NEGATIVE 02/06/2023 1841   PROTEINUR NEGATIVE 02/06/2023 1841   UROBILINOGEN 0.2 08/21/2013 0017   NITRITE NEGATIVE 02/06/2023 1841   LEUKOCYTESUR TRACE (A) 02/06/2023 1841   Sepsis Labs: @LABRCNTIP (procalcitonin:4,lacticidven:4) )No results found for this or any previous visit (from the past 240 hours).   Radiological Exams on Admission: DG Chest Port 1 View Result Date: 02/06/2023 CLINICAL DATA:  Altered mental status, burning with urination, weakness EXAM: PORTABLE CHEST 1 VIEW COMPARISON:  01/17/2023 FINDINGS: Stable enlargement of the cardiomediastinal silhouette. Aortic atherosclerotic calcification. Low lung volumes accentuate pulmonary vascularity. Scarring/atelectasis in the left mid lung. Otherwise no focal consolidation, pleural effusion, or pneumothorax. No displaced rib fractures. IMPRESSION: Low lung volumes without acute cardiopulmonary disease. Electronically Signed   By: Minerva Fester M.D.   On: 02/06/2023 19:18   CT HEAD WO CONTRAST Result Date: 02/06/2023 CLINICAL DATA:  Burning with urination, altered mental status, weakness EXAM: CT HEAD WITHOUT CONTRAST TECHNIQUE: Contiguous axial images were obtained from the base of the skull through  the vertex without intravenous contrast. RADIATION DOSE REDUCTION: This exam was performed according to the departmental dose-optimization program which includes automated exposure control, adjustment of the mA and/or kV according to patient size and/or use of iterative reconstruction technique. COMPARISON:  10/17/2022 FINDINGS: Brain: No evidence of acute infarction, hemorrhage, mass, mass effect, or midline shift. No hydrocephalus or extra-axial fluid collection. Periventricular white matter changes, likely the sequela of chronic small vessel ischemic disease. Vascular: No hyperdense vessel. Skull: Negative for fracture or focal lesion. Hyperostosis frontalis. Sinuses/Orbits: Mucosal thickening in the left ethmoid air cells. No acute finding in the orbits. Status post left lens replacement. Other: The mastoid air cells are well aerated. IMPRESSION: No acute intracranial process. Electronically Signed   By: Wiliam Ke M.D.   On: 02/06/2023 19:09    Assessment/Plan   1. Hepatic encephalopathy; decompensated cirrhosis  - Presents with progressive confusion and lethargy and found to have asterixis and increased ammonia level      - Unclear what precipitated this  - MELD-Na is 15 on admission  - Increase lactulose, continue rifaxamin, continue diuretics, use delirium and fall precautions    2. Hypotension  - Hx of chronic hypotension on midodrine  - BP stable ~130/70 in ED, will hold midodrine for now    3. Hypothyroidism  - Synthroid    4. Depression  - Effexor    5. AUB  - Megace   6. RLS  - Requip    DVT prophylaxis: Lovenox  Code Status: DNR Level of Care: Level of care: Med-Surg Family Communication: Son updated from ED   Disposition Plan:  Patient is from: Home  Anticipated d/c is to: TBD Anticipated d/c date is: 1/29 or 02/09/23 Patient currently: Pending improved mental status  Consults called: None  Admission status: Observation     Briscoe Deutscher, MD [facility]  02/07/2023, 5:13 AM

## 2023-02-08 DIAGNOSIS — K7682 Hepatic encephalopathy: Secondary | ICD-10-CM | POA: Diagnosis not present

## 2023-02-08 LAB — COMPREHENSIVE METABOLIC PANEL
ALT: 16 U/L (ref 0–44)
AST: 25 U/L (ref 15–41)
Albumin: 2.4 g/dL — ABNORMAL LOW (ref 3.5–5.0)
Alkaline Phosphatase: 88 U/L (ref 38–126)
Anion gap: 7 (ref 5–15)
BUN: 20 mg/dL (ref 8–23)
CO2: 20 mmol/L — ABNORMAL LOW (ref 22–32)
Calcium: 8.4 mg/dL — ABNORMAL LOW (ref 8.9–10.3)
Chloride: 114 mmol/L — ABNORMAL HIGH (ref 98–111)
Creatinine, Ser: 0.79 mg/dL (ref 0.44–1.00)
GFR, Estimated: >60 mL/min (ref >60)
Glucose, Bld: 105 mg/dL — ABNORMAL HIGH (ref 70–99)
Potassium: 3.5 mmol/L (ref 3.5–5.1)
Sodium: 141 mmol/L (ref 135–145)
Total Bilirubin: 2.3 mg/dL — ABNORMAL HIGH (ref 0.0–1.2)
Total Protein: 4.9 g/dL — ABNORMAL LOW (ref 6.5–8.1)

## 2023-02-08 LAB — CBC
HCT: 31.4 % — ABNORMAL LOW (ref 36.0–46.0)
Hemoglobin: 9.3 g/dL — ABNORMAL LOW (ref 12.0–15.0)
MCH: 28.8 pg (ref 26.0–34.0)
MCHC: 29.6 g/dL — ABNORMAL LOW (ref 30.0–36.0)
MCV: 97.2 fL (ref 80.0–100.0)
Platelets: 112 10*3/uL — ABNORMAL LOW (ref 150–400)
RBC: 3.23 MIL/uL — ABNORMAL LOW (ref 3.87–5.11)
RDW: 17.1 % — ABNORMAL HIGH (ref 11.5–15.5)
WBC: 4.5 10*3/uL (ref 4.0–10.5)
nRBC: 0 % (ref 0.0–0.2)

## 2023-02-08 LAB — PHOSPHORUS: Phosphorus: 4.3 mg/dL (ref 2.5–4.6)

## 2023-02-08 LAB — MAGNESIUM: Magnesium: 1.9 mg/dL (ref 1.7–2.4)

## 2023-02-08 LAB — AMMONIA: Ammonia: 77 umol/L — ABNORMAL HIGH (ref 9–35)

## 2023-02-08 NOTE — Evaluation (Signed)
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  Clinical Impression  Pt admitted with above diagnosis.   Pt currently with functional limitations due to the deficits listed below (see PT Problem List). Pt will benefit from acute skilled PT to increase their independence and safety with mobility to allow discharge.   The patient was lethargic initially but perked up, able to transfer to [facility] and ambulated x 10' in the room. Patient is always motivated. Patient should progress to return home.        If plan is discharge home, recommend the following: A little help with walking and/or transfers;A little help with bathing/dressing/bathroom;Assistance with cooking/housework;Assist for transportation;Help with stairs or ramp for entrance   Can travel by private vehicle        Equipment Recommendations None recommended by PT  Recommendations for Other Services       Functional Status Assessment Patient has had a recent decline in their functional status and demonstrates the ability to make significant improvements in function in a reasonable and predictable amount of time.     Precautions / Restrictions Precautions Precautions: Fall Precaution Comments: on lactulose Restrictions Weight Bearing Restrictions Per Provider Order: No      Mobility  Bed Mobility Overal bed mobility: Modified Independent             General bed mobility comments: use of bed rail    Transfers Overall transfer level: Needs assistance Equipment used: Rolling walker (2 wheels) Transfers: Sit to/from Stand, Bed to chair/wheelchair/BSC Sit to Stand: Contact guard  assist   Step pivot transfers: Contact guard assist            Ambulation/Gait Ambulation/Gait assistance: Min assist Gait Distance (Feet): 10 Feet Assistive device: Rolling walker (2 wheels) Gait Pattern/deviations: Step-to pattern Gait velocity: decr     General Gait Details: slow  gait speed.  Stairs            Wheelchair Mobility     Tilt Bed    Modified Rankin (Stroke Patients Only)       Balance Overall balance assessment: Needs assistance Sitting-balance support: No upper extremity supported, Feet supported Sitting balance-Leahy Scale: Good     Standing balance support: Reliant on assistive device for balance, Bilateral upper extremity supported, During functional activity Standing balance-Leahy Scale: Fair                               Pertinent Vitals/Pain Pain Assessment Pain Assessment: Faces Pain Location: periarea Pain Descriptors / Indicators: Discomfort, Burning Pain Intervention(s): Monitored during session    Home Living Family/patient expects to be discharged to:: Private residence Living Arrangements: Children Available Help at Discharge: Family;Available 24 hours/day Type of Home: House Home Access: Ramped entrance       Home Layout: One level Home Equipment: Rollator (4 wheels);Rolling Walker (2 wheels);BSC/3in1;Other (comment);Hospital bed;Wheelchair - manual Additional Comments: Pt lives with son [name] - very supportive, provides assist as needed    Prior Function Prior Level of Function : Needs assist             Mobility Comments: son typically present for all mobility; uses RW in home, manual WC outside home ADLs  Comments: Son has been assisting with ADLs lately. Pt states she was doing her own ADL tasks until recently     Extremity/Trunk Assessment   Upper Extremity Assessment Upper Extremity Assessment: Overall WFL for tasks assessed    Lower Extremity Assessment Lower Extremity Assessment:  Generalized weakness    Cervical / Trunk Assessment Cervical / Trunk Assessment: Normal  Communication   Communication Communication: No apparent difficulties  Cognition Arousal: Alert, Lethargic Behavior During Therapy: WFL for tasks assessed/performed Overall Cognitive Status: Within Functional Limits for tasks assessed                                 General Comments: aroused readily with mobility        General Comments      Exercises     Assessment/Plan    PT Assessment Patient needs continued PT services  PT Problem List Decreased strength;Decreased balance;Decreased knowledge of precautions;Decreased range of motion;Decreased mobility;Decreased activity tolerance       PT Treatment Interventions DME instruction;Therapeutic activities;Gait training;Therapeutic exercise;Patient/family education;Functional mobility training    PT Goals (Current goals can be found in the Care Plan section)  Acute Rehab PT Goals Patient Stated Goal: to go home PT Goal Formulation: With patient Time For Goal Achievement: 02/22/23 Potential to Achieve Goals: Good    Frequency Min 1X/week     Co-evaluation               AM-PAC PT "6 Clicks" Mobility  Outcome Measure Help needed turning from your back to your side while in a flat bed without using bedrails?: None Help needed moving from lying on your back to sitting on the side of a flat bed without using bedrails?: None Help needed moving to and from a bed to a chair (including a wheelchair)?: A Little Help needed standing up from a chair using your arms (e.g., wheelchair or bedside chair)?: A Little Help needed to walk in hospital room?: A Lot Help needed climbing 3-5 steps with a railing? : A Lot 6 Click Score: 18    End of Session   Activity Tolerance: Patient limited by fatigue;Patient tolerated treatment well Patient left: in bed;with call bell/phone within reach Nurse Communication: Mobility  status PT Visit Diagnosis: Unsteadiness on feet (R26.81);Muscle weakness (generalized) (M62.81);Difficulty in walking, not elsewhere classified (R26.2)    Time: 1610-9604 PT Time Calculation (min) (ACUTE ONLY): 18 min   Charges:   PT Evaluation $PT Eval Low Complexity: 1 Low   PT General Charges $$ ACUTE PT VISIT: 1 Visit         Blanchard Kelch PT Acute Rehabilitation Services Office [phone] Weekend pager-[phone]   Rada Hay 02/08/2023, 4:33 PM

## 2023-02-08 NOTE — Progress Notes (Signed)
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  Upon admission CT of the head is negative.  Ammonia was noted to be 108 therefore started on aggressive lactulose.  Assessment & Plan:  Principal Problem:   Hepatic encephalopathy (HCC) Active Problems:   Cirrhosis due to NASH   Hypothyroidism   Hypotension   Decompensated cirrhosis (HCC)   Depression, major, recurrent, moderate (HCC)     Acute hepatic encephalopathy Decompensated NASH cirrhosis -Admission ammonia 108 > 77.  Mentation is slowly improving.  Will continue aggressive lactulose to have at least 4-5 bowel movements daily for now or until mental status improves.  Thereafter should be having at least 3-4 soft bowel movements daily. -Hemoglobin, platelets are overall stable for now -Continue home Lasix, rifaximin, Aldactone   Chronic Hypotension  -Midodrine   Hypothyroidism  - Synthroid     Depression  - Effexor     AUB  - Megace    RLS  - Requip   Hyperlipidemia - Statin  Unfortunately patient continues to have recurrent hospitalizations secondary to acute encephalopathy.  This is her fifth admission in last 8 weeks.  PT/OT DVT prophylaxis: Lovenox    Code Status: Limited: Do not attempt resuscitation (DNR) -DNR-LIMITED -Do Not Intubate/DNI  Family Communication:   Still confused continue lactulose aggressively    Subjective: Drowsy this morning but once awake she was able to answer most of the questions better today   Examination:  General exam: Appears calm and comfortable  Respiratory system: Clear to auscultation. Respiratory effort normal. Cardiovascular system: S1 & S2 heard, RRR. No JVD, murmurs, rubs,  gallops or clicks. No pedal edema. Gastrointestinal system: Abdomen is nondistended, soft and nontender. No organomegaly or masses felt. Normal bowel sounds heard. Central nervous system: Alert and oriented x 2(baseline 3). No focal neurological deficits. Extremities: Symmetric 5 x 5 power. Skin: No rashes, lesions or ulcers Psychiatry: Judgement and insight appear normal. Mood & affect appropriate.                Diet Orders (From admission, onward)     Start     Ordered   02/07/23 0513  Diet Heart Room service appropriate? Yes; Fluid consistency: Thin  Diet effective now       Question Answer Comment  Room service appropriate? Yes   Fluid consistency: Thin      02/07/23 0513            Objective: Vitals:   02/07/23 1611 02/07/23 2044 02/07/23 2352 02/08/23 0422  BP: (!) 109/58 (!) 106/58 127/63 127/73  Pulse: 83 80 75 85  Resp: 16 19 20  (!) 21  Temp: 98.3 F (36.8 C) 98.2 F (36.8 C) 98.5 F (36.9 C) 98.5 F (36.9 C)  TempSrc: Oral Oral Oral Oral  SpO2: 100% 98% 100% 98%  Weight:      Height:        Intake/Output Summary (Last 24 hours) at 02/08/2023 1115 Last data filed at 02/07/2023 2117 Gross per 24 hour  Intake 120 ml  Output --  Net 120 ml   Filed Weights   02/06/23 1826  Weight: 107.6 kg    Scheduled Meds:  atorvastatin  40 mg Oral Daily   enoxaparin (LOVENOX) injection  50 mg  Subcutaneous Q24H   furosemide  40 mg Oral Daily   lactulose  30 g Oral TID   levothyroxine  150 mcg Oral QAC breakfast   megestrol  40 mg Oral BID   methocarbamol  500 mg Oral q AM   midodrine  10 mg Oral TID WC   nystatin  5 mL Oral QID   pantoprazole  40 mg Oral BID AC   rifaximin  550 mg Oral BID   rOPINIRole  0.25 mg Oral QHS   spironolactone  50 mg Oral Daily   tamsulosin  0.4 mg Oral Daily   venlafaxine XR  75 mg Oral Q breakfast   Continuous Infusions:  Nutritional status     Body mass index is 43.39 kg/m.  Data Reviewed:   CBC: Recent  Labs  Lab 02/06/23 2217 02/08/23 0752  WBC 6.9 4.5  NEUTROABS 3.7  --   HGB 10.6* 9.3*  HCT 35.0* 31.4*  MCV 94.3 97.2  PLT 151 112*   Basic Metabolic Panel: Recent Labs  Lab 02/06/23 2217 02/08/23 0752  NA 139 141  K 4.0 3.5  CL 110 114*  CO2 20* 20*  GLUCOSE 119* 105*  BUN 20 20  CREATININE 0.96 0.79  CALCIUM 8.7* 8.4*  MG  --  1.9  PHOS  --  4.3   GFR: Estimated Creatinine Clearance: 80.9 mL/min (by C-G formula based on SCr of 0.79 mg/dL). Liver Function Tests: Recent Labs  Lab 02/06/23 2217 02/08/23 0752  AST 28 25  ALT 20 16  ALKPHOS 118 88  BILITOT 2.2* 2.3*  PROT 6.2* 4.9*  ALBUMIN 3.0* 2.4*   No results for input(s): "LIPASE", "AMYLASE" in the last 168 hours. Recent Labs  Lab 02/06/23 2217 02/08/23 0752  AMMONIA 108* 77*   Coagulation Profile: Recent Labs  Lab 02/06/23 2217  INR 1.5*   Cardiac Enzymes: No results for input(s): "CKTOTAL", "CKMB", "CKMBINDEX", "TROPONINI" in the last 168 hours. BNP (last 3 results) No results for input(s): "PROBNP" in the last 8760 hours. HbA1C: No results for input(s): "HGBA1C" in the last 72 hours. CBG: Recent Labs  Lab 02/06/23 1908  GLUCAP 91   Lipid Profile: No results for input(s): "CHOL", "HDL", "LDLCALC", "TRIG", "CHOLHDL", "LDLDIRECT" in the last 72 hours. Thyroid Function Tests: No results for input(s): "TSH", "T4TOTAL", "FREET4", "T3FREE", "THYROIDAB" in the last 72 hours. Anemia Panel: No results for input(s): "VITAMINB12", "FOLATE", "FERRITIN", "TIBC", "IRON", "RETICCTPCT" in the last 72 hours. Sepsis Labs: No results for input(s): "PROCALCITON", "LATICACIDVEN" in the last 168 hours.  No results found for this or any previous visit (from the past 240 hours).       Radiology Studies: DG Chest Port 1 View Result Date: 02/06/2023 CLINICAL DATA:  Altered mental status, burning with urination, weakness EXAM: PORTABLE CHEST 1 VIEW COMPARISON:  01/17/2023 FINDINGS: Stable enlargement of  the cardiomediastinal silhouette. Aortic atherosclerotic calcification. Low lung volumes accentuate pulmonary vascularity. Scarring/atelectasis in the left mid lung. Otherwise no focal consolidation, pleural effusion, or pneumothorax. No displaced rib fractures. IMPRESSION: Low lung volumes without acute cardiopulmonary disease. Electronically Signed   By: Minerva Fester M.D.   On: 02/06/2023 19:18   CT HEAD WO CONTRAST Result Date: 02/06/2023 CLINICAL DATA:  Burning with urination, altered mental status, weakness EXAM: CT HEAD WITHOUT CONTRAST TECHNIQUE: Contiguous axial images were obtained from the base of the skull through the vertex without intravenous contrast. RADIATION DOSE REDUCTION: This exam was performed according to the departmental dose-optimization program which includes automated exposure  control, adjustment of the mA and/or kV according to patient size and/or use of iterative reconstruction technique. COMPARISON:  10/17/2022 FINDINGS: Brain: No evidence of acute infarction, hemorrhage, mass, mass effect, or midline shift. No hydrocephalus or extra-axial fluid collection. Periventricular white matter changes, likely the sequela of chronic small vessel ischemic disease. Vascular: No hyperdense vessel. Skull: Negative for fracture or focal lesion. Hyperostosis frontalis. Sinuses/Orbits: Mucosal thickening in the left ethmoid air cells. No acute finding in the orbits. Status post left lens replacement. Other: The mastoid air cells are well aerated. IMPRESSION: No acute intracranial process. Electronically Signed   By: Wiliam Ke M.D.   On: 02/06/2023 19:09           LOS: 1 day   Time spent= 35 mins    Miguel Rota, MD [facility]  If 7PM-7AM, please contact night-coverage  02/08/2023, 11:15 AM

## 2023-02-08 NOTE — Consult Note (Addendum)
WOC Nurse Consult Note: Reason for Consult: redness behind knees, R side and scratch marks buttocks  Wound type: Irritant Contact Dermatitis  ICD-10 CM Codes for Irritant Dermatitis  L30.4  - Erythema intertrigo. Also used for abrasion of the hand, chafing of the skin, dermatitis due to sweating and friction, friction dermatitis, friction eczema, and genital/thigh intertrigo.  Pressure Injury POA: NA  Measurement: widespread erythema behind folds of both knees, underneath both breasts and fold of skin R flank area  Wound bed: erythema with scattered partial thickness skin loss  Drainage (amount, consistency, odor) minimal serosanguinous  Periwound: moist  Dressing procedure/placement/frequency:  Clean underneath B knees and B breasts with soap and water, dry and apply Interdry Order [employer] [place] # [id number]; cut a small piece off bottom of long sheet of Interdry, cut in half and place behind both knees to absorb moisture and prevent skin folds from rubbing together. For breasts apply Interdry as follows:  Measure and cut length of InterDry to fit in skin folds that have skin breakdown Tuck InterDry fabric into skin folds in a single layer, allow for 2 inches of overhang from skin edges to allow for wicking to occur May remove to bathe; dry area thoroughly and then tuck into affected areas again Do not apply any creams or ointments when using InterDry DO NOT THROW AWAY FOR 5 DAYS unless soiled with stool DO NOT [facility] product, this will inactivate the silver in the material  New sheet of Interdry should be applied after 5 days of use if patient continues to have skin breakdown    2.  Right flank area of Irritant Contact Dermatitis clean with soap and water, dry and apply a piece of silver hydrofiber [employer] [place] [phone]) daily, may cover with silicone foam or ABD pad whichever preferred.   Patient has allergy to Zinc Oxide therefore can not use Desitin or Gerhardt's Butt Cream, for excoriations to B buttocks  apply a thin layer of floor stock purple top cream to protect  2 times a day and prn soiling.    POC discussed with bedside nurse. WOC team will not follow. Re-consult if further needs arise.   Thank you,     Priscella Mann MSN, RN-BC, [employer] [phone]

## 2023-02-09 ENCOUNTER — Other Ambulatory Visit (HOSPITAL_COMMUNITY): Payer: Self-pay

## 2023-02-09 DIAGNOSIS — K7682 Hepatic encephalopathy: Secondary | ICD-10-CM | POA: Diagnosis not present

## 2023-02-09 LAB — COMPREHENSIVE METABOLIC PANEL
ALT: 16 U/L (ref 0–44)
AST: 23 U/L (ref 15–41)
Albumin: 2.3 g/dL — ABNORMAL LOW (ref 3.5–5.0)
Alkaline Phosphatase: 86 U/L (ref 38–126)
Anion gap: 6 (ref 5–15)
BUN: 18 mg/dL (ref 8–23)
CO2: 20 mmol/L — ABNORMAL LOW (ref 22–32)
Calcium: 8.3 mg/dL — ABNORMAL LOW (ref 8.9–10.3)
Chloride: 115 mmol/L — ABNORMAL HIGH (ref 98–111)
Creatinine, Ser: 0.86 mg/dL (ref 0.44–1.00)
GFR, Estimated: >60 mL/min (ref >60)
Glucose, Bld: 120 mg/dL — ABNORMAL HIGH (ref 70–99)
Potassium: 3.6 mmol/L (ref 3.5–5.1)
Sodium: 141 mmol/L (ref 135–145)
Total Bilirubin: 1.8 mg/dL — ABNORMAL HIGH (ref 0.0–1.2)
Total Protein: 4.8 g/dL — ABNORMAL LOW (ref 6.5–8.1)

## 2023-02-09 LAB — CBC
HCT: 29.5 % — ABNORMAL LOW (ref 36.0–46.0)
Hemoglobin: 8.9 g/dL — ABNORMAL LOW (ref 12.0–15.0)
MCH: 29.1 pg (ref 26.0–34.0)
MCHC: 30.2 g/dL (ref 30.0–36.0)
MCV: 96.4 fL (ref 80.0–100.0)
Platelets: 115 10*3/uL — ABNORMAL LOW (ref 150–400)
RBC: 3.06 MIL/uL — ABNORMAL LOW (ref 3.87–5.11)
RDW: 16.9 % — ABNORMAL HIGH (ref 11.5–15.5)
WBC: 4.7 10*3/uL (ref 4.0–10.5)
nRBC: 0 % (ref 0.0–0.2)

## 2023-02-09 LAB — MAGNESIUM: Magnesium: 1.7 mg/dL (ref 1.7–2.4)

## 2023-02-09 MED ORDER — SPIRONOLACTONE 50 MG PO TABS
50.0000 mg | ORAL_TABLET | Freq: Every day | ORAL | 0 refills | Status: DC
  Filled 2023-02-09: qty 30, 30d supply, fill #0

## 2023-02-09 NOTE — Progress Notes (Signed)
Discharge instructions reviewed with patient and son. All questions answered. All belongings accounted for. Patient's son stated an appointment is already made for FEB. 24 and they have arranged to follow up then with pcp. Patient medications hand delivered from outpatient pharmacy. PIV removed. Patient's son getting vehicle to transport patient' home.

## 2023-02-09 NOTE — Discharge Summary (Signed)
Physician Discharge Summary  Elaynah Virginia UJW:[id number] DOB: [birth year] DOA: 02/06/2023  PCP: [name], MD  Admit date: 02/06/2023 Discharge date: 02/09/2023  Admitted From: [facility] Disposition:  Home  Recommendations for Outpatient Follow-up:  Follow up with PCP in 1-2 weeks Please obtain BMP/CBC in one week your next doctors visit.  Aldactone refilled.    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  Upon admission CT of the head is negative.  Ammonia was noted to be 108 therefore started on aggressive lactulose. After aggressive lactulose, patient feels a whole lot better.  Mentation is better.  Son is at bedside.  Medically stable for discharge.  Assessment & Plan:  Principal Problem:   Hepatic encephalopathy (HCC) Active Problems:   Cirrhosis due to NASH   Hypothyroidism   Hypotension   Decompensated cirrhosis (HCC)   Depression, major, recurrent, moderate (HCC)     Acute hepatic encephalopathy, resolved Decompensated NASH cirrhosis -Admission ammonia 108 > 77.  Mentation is back to baseline, after lactulose this is resolved.  Son is present at bedside who confirms.  Will resume home medications.     Chronic Hypotension  -Midodrine   Hypothyroidism  - Synthroid     Depression  - Effexor     AUB  - Megace    RLS  - Requip   Hyperlipidemia - Statin  Unfortunately patient continues to have recurrent hospitalizations secondary to acute encephalopathy.  This is her fifth admission in last 8 weeks.  PT/OT-home health.  F2F completed  DVT prophylaxis: Lovenox    Code Status: Limited: Do not attempt resuscitation (DNR) -DNR-LIMITED -Do Not Intubate/DNI  Family  Communication: Son at side Discharge   Subjective:  Feeling okay no complaints.  Would like to go home.  Examination:  General exam: Appears calm and comfortable  Respiratory system: Clear to auscultation. Respiratory effort normal. Cardiovascular system: S1 & S2 heard, RRR. No JVD, murmurs, rubs, gallops or clicks. No pedal edema. Gastrointestinal system: Abdomen is nondistended, soft and nontender. No organomegaly or masses felt. Normal bowel sounds heard. Central nervous system: Alert and oriented x 2(baseline 3). No focal neurological deficits. Extremities: Symmetric 5 x 5 power. Skin: No rashes, lesions or ulcers Psychiatry: Judgement and insight appear normal. Mood & affect appropriate.    Discharge Diagnoses:  Principal Problem:   Hepatic encephalopathy (HCC) Active Problems:   Cirrhosis due to NASH   Hypothyroidism   Hypotension   Decompensated cirrhosis (HCC)   Depression, major, recurrent, moderate (HCC)      Discharge Exam: Vitals:   02/09/23 0549 02/09/23 0947  BP: (!) 121/59 (!) 110/59  Pulse: 89 83  Resp: 16   Temp: 98.9 F (37.2 C) 98.6 F (37 C)  SpO2: 97% 100%   Vitals:   02/08/23 1438 02/08/23 2102 02/09/23 0549 02/09/23 0947  BP: 126/66 (!) 125/58 (!) 121/59 (!) 110/59  Pulse: 66 72 89 83  Resp: 20 16 16    Temp: 97.8 F (36.6 C) 98.2 F (36.8 C) 98.9 F (37.2 C) 98.6 F (37 C)  TempSrc: Oral Oral Oral Oral  SpO2: 99% 100% 97% 100%  Weight:      Height:          Discharge Instructions  Discharge Instructions     meds to beds pharmacy consult ([facility]  ONLY)   Complete by: As directed       Allergies as of 02/09/2023       Reactions   Ace Inhibitors Anaphylaxis, Swelling   Ivp Dye [iodinated Contrast Media] Anaphylaxis   Desitin [zinc Oxide] Rash, Other (See Comments)   Worsens rash   Gadolinium Derivatives Other (See Comments)   Reaction??   Hydroxyzine Other (See Comments)   Affects the mind   Chlorhexidine Rash,  Other (See Comments)   WORSENS RASHES   Doxycycline Rash   Penicillins Rash   Zofran [ondansetron] Rash        Medication List     STOP taking these medications    TYLENOL 500 MG tablet Generic drug: acetaminophen       TAKE these medications    atorvastatin 40 MG tablet Commonly known as: LIPITOR Take 40 mg by mouth in the morning.   colchicine 0.6 MG tablet Take 0.6 mg by mouth daily as needed (gout flares).   cyanocobalamin 1000 MCG tablet Commonly known as: VITAMIN B12 Take 2,000 mcg by mouth every Friday.   dicyclomine 10 MG capsule Commonly known as: BENTYL Take 1 capsule (10 mg total) by mouth 3 (three) times daily as needed for spasms (abdominal cramping).   famotidine 20 MG tablet Commonly known as: PEPCID Take 20 mg by mouth in the morning.   folic acid 1 MG tablet Commonly known as: FOLVITE Take 1 tablet (1 mg total) by mouth daily.   furosemide 40 MG tablet Commonly known as: LASIX Take 1 tablet (40 mg total) by mouth daily. Take additional 40 mg if there is a weight gain of 3 lbs in1 day or 5 lbs in 1 week.   HYDROcodone-acetaminophen 10-325 MG tablet Commonly known as: NORCO Take 1 tablet by mouth every 8 (eight) hours as needed for moderate pain (pain score 4-6).   IRON-VITAMIN C PO Take 1 tablet by mouth daily with breakfast.   lactulose 10 GM/15ML solution Commonly known as: CHRONULAC Take 30 mLs (20 g total) by mouth 3 (three) times daily. Take to ensure that there is 2 loose bowel movement EVERY DAY What changed:  when to take this reasons to take this additional instructions   levothyroxine 150 MCG tablet Commonly known as: SYNTHROID Take 150 mcg by mouth daily before breakfast.   lidocaine 5 % Commonly known as: LIDODERM Place 1 patch onto the skin daily. Remove & Discard patch within 12 hours or as directed by MD What changed:  when to take this reasons to take this additional instructions   megestrol 40 MG  tablet Commonly known as: MEGACE Take 1 tablet (40 mg total) by mouth 2 (two) times daily.   methocarbamol 500 MG tablet Commonly known as: ROBAXIN Take 500 mg by mouth in the morning.   midodrine 10 MG tablet Commonly known as: PROAMATINE Take 1 tablet (10 mg total) by mouth 3 (three) times daily with meals.   naloxone 4 MG/0.1ML Liqd nasal spray kit Commonly known as: NARCAN Place 1 spray into the nose as needed (accidental overdose).   nystatin powder Apply 1 Application topically 3 (three) times daily as needed (for irritation- affected areas).   nystatin cream Commonly known as: MYCOSTATIN Apply topically 2 (two) times daily.   pantoprazole 40 MG tablet Commonly known as: PROTONIX Take 1 tablet (40 mg total) by mouth 2 (two) times daily before a meal.   promethazine 12.5 MG tablet Commonly known as: PHENERGAN Take 12.5 mg by mouth every 6 (  six) hours as needed for nausea or vomiting.   rifaximin 550 MG Tabs tablet Commonly known as: XIFAXAN Take 550 mg by mouth 2 (two) times daily.   rOPINIRole 0.25 MG tablet Commonly known as: REQUIP Take 1 tablet (0.25 mg total) by mouth at bedtime. What changed: when to take this   spironolactone 50 MG tablet Commonly known as: ALDACTONE Take 1 tablet (50 mg total) by mouth daily.   tamsulosin 0.4 MG Caps capsule Commonly known as: FLOMAX Take 1 capsule (0.4 mg total) by mouth daily.   triamcinolone cream 0.5 % Commonly known as: KENALOG Apply to affected area of right breast twice daily as directed. What changed:  how much to take when to take this reasons to take this   venlafaxine XR 75 MG 24 hr capsule Commonly known as: EFFEXOR-XR Take 75 mg by mouth daily with breakfast.   Vitamin D (Ergocalciferol) 1.25 MG (50000 UNIT) Caps capsule Commonly known as: DRISDOL Take 50,000 Units by mouth every Wednesday.        Follow-up Information     [name], MD Follow up in 1 week(s).   Specialty: Family  Medicine Contact information: [address] [place] [address] [phone]                Allergies  Allergen Reactions   Ace Inhibitors Anaphylaxis and Swelling   Ivp Dye [Iodinated Contrast Media] Anaphylaxis   Desitin [Zinc Oxide] Rash and Other (See Comments)    Worsens rash   Gadolinium Derivatives Other (See Comments)    Reaction??   Hydroxyzine Other (See Comments)    Affects the mind   Chlorhexidine Rash and Other (See Comments)    WORSENS RASHES   Doxycycline Rash   Penicillins Rash   Zofran [Ondansetron] Rash    You were cared for by a hospitalist during your hospital stay. If you have any questions about your discharge medications or the care you received while you were in the hospital after you are discharged, you can call the unit and asked to speak with the hospitalist on call if the hospitalist that took care of you is not available. Once you are discharged, your primary care physician will handle any further medical issues. Please note that no refills for any discharge medications will be authorized once you are discharged, as it is imperative that you return to your primary care physician (or establish a relationship with a primary care physician if you do not have one) for your aftercare needs so that they can reassess your need for medications and monitor your lab values.  You were cared for by a hospitalist during your hospital stay. If you have any questions about your discharge medications or the care you received while you were in the hospital after you are discharged, you can call the unit and asked to speak with the hospitalist on call if the hospitalist that took care of you is not available. Once you are discharged, your primary care physician will handle any further medical issues. Please note that NO REFILLS for any discharge medications will be authorized once you are discharged, as it is imperative that you return to your primary care  physician (or establish a relationship with a primary care physician if you do not have one) for your aftercare needs so that they can reassess your need for medications and monitor your lab values.  Please request your Prim.MD to go over all Hospital Tests and Procedure/Radiological results at the  follow up, please get all Hospital records sent to your Prim MD by signing hospital release before you go home.  Get CBC, CMP, 2 view Chest X ray checked  by Primary MD during your next visit or SNF MD in 5-7 days ( we routinely change or add medications that can affect your baseline labs and fluid status, therefore we recommend that you get the mentioned basic workup next visit with your PCP, your PCP may decide not to get them or add new tests based on their clinical decision)  On your next visit with your primary care physician please Get Medicines reviewed and adjusted.  If you experience worsening of your admission symptoms, develop shortness of breath, life threatening emergency, suicidal or homicidal thoughts you must seek medical attention immediately by calling 911 or calling your MD immediately  if symptoms less severe.  You Must read complete instructions/literature along with all the possible adverse reactions/side effects for all the Medicines you take and that have been prescribed to you. Take any new Medicines after you have completely understood and accpet all the possible adverse reactions/side effects.   Do not drive, operate heavy machinery, perform activities at heights, swimming or participation in water activities or provide baby sitting services if your were admitted for syncope or siezures until you have seen by Primary MD or a Neurologist and advised to do so again.  Do not drive when taking Pain medications.   Procedures/Studies: DG Chest Port 1 View Result Date: 02/06/2023 CLINICAL DATA:  Altered mental status, burning with urination, weakness EXAM: PORTABLE CHEST 1 VIEW  COMPARISON:  01/17/2023 FINDINGS: Stable enlargement of the cardiomediastinal silhouette. Aortic atherosclerotic calcification. Low lung volumes accentuate pulmonary vascularity. Scarring/atelectasis in the left mid lung. Otherwise no focal consolidation, pleural effusion, or pneumothorax. No displaced rib fractures. IMPRESSION: Low lung volumes without acute cardiopulmonary disease. Electronically Signed   By: Minerva Fester M.D.   On: 02/06/2023 19:18   CT HEAD WO CONTRAST Result Date: 02/06/2023 CLINICAL DATA:  Burning with urination, altered mental status, weakness EXAM: CT HEAD WITHOUT CONTRAST TECHNIQUE: Contiguous axial images were obtained from the base of the skull through the vertex without intravenous contrast. RADIATION DOSE REDUCTION: This exam was performed according to the departmental dose-optimization program which includes automated exposure control, adjustment of the mA and/or kV according to patient size and/or use of iterative reconstruction technique. COMPARISON:  10/17/2022 FINDINGS: Brain: No evidence of acute infarction, hemorrhage, mass, mass effect, or midline shift. No hydrocephalus or extra-axial fluid collection. Periventricular white matter changes, likely the sequela of chronic small vessel ischemic disease. Vascular: No hyperdense vessel. Skull: Negative for fracture or focal lesion. Hyperostosis frontalis. Sinuses/Orbits: Mucosal thickening in the left ethmoid air cells. No acute finding in the orbits. Status post left lens replacement. Other: The mastoid air cells are well aerated. IMPRESSION: No acute intracranial process. Electronically Signed   By: Wiliam Ke M.D.   On: 02/06/2023 19:09   DG Chest Port 1 View Result Date: 01/17/2023 CLINICAL DATA:  Cough EXAM: PORTABLE CHEST 1 VIEW COMPARISON:  Chest x-ray 01/02/2023 FINDINGS: Heart is enlarged. The lungs are clear. There is no pleural effusion or pneumothorax. No acute fractures are seen. IMPRESSION: Cardiomegaly. No  acute cardiopulmonary process. Electronically Signed   By: Darliss Cheney M.D.   On: 01/17/2023 17:50     The results of significant diagnostics from this hospitalization (including imaging, microbiology, ancillary and laboratory) are listed below for reference.     Microbiology: No results found  for this or any previous visit (from the past 240 hours).   Labs: BNP (last 3 results) Recent Labs    08/30/22 0354 01/03/23 0616  BNP 614.9* 469.4*   Basic Metabolic Panel: Recent Labs  Lab 02/06/23 2217 02/08/23 0752 02/09/23 0639  NA 139 141 141  K 4.0 3.5 3.6  CL 110 114* 115*  CO2 20* 20* 20*  GLUCOSE 119* 105* 120*  BUN 20 20 18   CREATININE 0.96 0.79 0.86  CALCIUM 8.7* 8.4* 8.3*  MG  --  1.9 1.7  PHOS  --  4.3  --    Liver Function Tests: Recent Labs  Lab 02/06/23 2217 02/08/23 0752 02/09/23 0639  AST 28 25 23   ALT 20 16 16   ALKPHOS 118 88 86  BILITOT 2.2* 2.3* 1.8*  PROT 6.2* 4.9* 4.8*  ALBUMIN 3.0* 2.4* 2.3*   No results for input(s): "LIPASE", "AMYLASE" in the last 168 hours. Recent Labs  Lab 02/06/23 2217 02/08/23 0752  AMMONIA 108* 77*   CBC: Recent Labs  Lab 02/06/23 2217 02/08/23 0752 02/09/23 0639  WBC 6.9 4.5 4.7  NEUTROABS 3.7  --   --   HGB 10.6* 9.3* 8.9*  HCT 35.0* 31.4* 29.5*  MCV 94.3 97.2 96.4  PLT 151 112* 115*   Cardiac Enzymes: No results for input(s): "CKTOTAL", "CKMB", "CKMBINDEX", "TROPONINI" in the last 168 hours. BNP: Invalid input(s): "POCBNP" CBG: Recent Labs  Lab 02/06/23 1908  GLUCAP 91   D-Dimer No results for input(s): "DDIMER" in the last 72 hours. Hgb A1c No results for input(s): "HGBA1C" in the last 72 hours. Lipid Profile No results for input(s): "CHOL", "HDL", "LDLCALC", "TRIG", "CHOLHDL", "LDLDIRECT" in the last 72 hours. Thyroid function studies No results for input(s): "TSH", "T4TOTAL", "T3FREE", "THYROIDAB" in the last 72 hours.  Invalid input(s): "FREET3" Anemia work up No results for  input(s): "VITAMINB12", "FOLATE", "FERRITIN", "TIBC", "IRON", "RETICCTPCT" in the last 72 hours. Urinalysis    Component Value Date/Time   COLORURINE AMBER (A) 02/06/2023 1841   APPEARANCEUR CLEAR 02/06/2023 1841   LABSPEC 1.024 02/06/2023 1841   PHURINE 5.0 02/06/2023 1841   GLUCOSEU NEGATIVE 02/06/2023 1841   HGBUR NEGATIVE 02/06/2023 1841   BILIRUBINUR NEGATIVE 02/06/2023 1841   KETONESUR NEGATIVE 02/06/2023 1841   PROTEINUR NEGATIVE 02/06/2023 1841   UROBILINOGEN 0.2 08/21/2013 0017   NITRITE NEGATIVE 02/06/2023 1841   LEUKOCYTESUR TRACE (A) 02/06/2023 1841   Sepsis Labs Recent Labs  Lab 02/06/23 2217 02/08/23 0752 02/09/23 0639  WBC 6.9 4.5 4.7   Microbiology No results found for this or any previous visit (from the past 240 hours).   Time coordinating discharge:  I have spent 35 minutes face to face with the patient and on the ward discussing the patients care, assessment, plan and disposition with other care givers. >50% of the time was devoted counseling the patient about the risks and benefits of treatment/Discharge disposition and coordinating care.   SIGNED:   Miguel Rota, MD  [facility] 02/09/2023, 12:17 PM   If 7PM-7AM, please contact night-coverage

## 2023-02-10 ENCOUNTER — Telehealth: Payer: Self-pay

## 2023-02-10 NOTE — Transitions of Care (Post Inpatient/ED Visit) (Signed)
02/10/2023  Name: Kyrstyn Greear MRN: [id number] DOB: 1955/08/31  Today's TOC FU Call Status: Today's TOC FU Call Status:: Successful TOC FU Call Completed TOC FU Call Complete Date: 02/10/23 Patient's Name and Date of Birth confirmed.  Transition Care Management Follow-up Telephone Call Date of Discharge: 02/09/23 Discharge Facility: [facility]) Type of Discharge: Inpatient Admission Primary Inpatient Discharge Diagnosis:: encephalopathy How have you been since you were released from the hospital?: Better Any questions or concerns?: No  Items Reviewed: Did you receive and understand the discharge instructions provided?: Yes Medications obtained,verified, and reconciled?: Yes (Medications Reviewed) Any new allergies since your discharge?: No Dietary orders reviewed?: Yes Do you have support at home?: Yes People in Home: child(ren), adult  Medications Reviewed Today: Medications Reviewed Today     Reviewed by Karena Addison, LPN (Licensed Practical Nurse) on 02/10/23 at [phone]  Med List Status: <None>   Medication Order Taking? Sig Documenting Provider Last Dose Status Informant  atorvastatin (LIPITOR) 40 MG tablet [id number] No Take 40 mg by mouth in the morning. [provider] 02/05/2023 Active Pharmacy Records, Child  colchicine 0.6 MG tablet [id number] No Take 0.6 mg by mouth daily as needed (gout flares). [provider] Past Month Active Pharmacy Records, Child           Med Note Sherlon Handing, [facility] D   Tue Feb 07, 2023  9:43 AM) unknown  cyanocobalamin (VITAMIN B12) 1000 MCG tablet [id number] No Take 2,000 mcg by mouth every Friday. [provider] Past Week Active Pharmacy Records, Child  dicyclomine (BENTYL) 10 MG capsule [id number] No Take 1 capsule (10 mg total) by mouth 3 (three) times daily as needed for spasms (abdominal cramping). Lewie Chamber, MD 02/05/2023 Active Pharmacy Records, Child  famotidine (PEPCID) 20 MG tablet [id number] No  Take 20 mg by mouth in the morning. [provider] 02/05/2023 Active Pharmacy Records, Child  folic acid (FOLVITE) 1 MG tablet [id number] No Take 1 tablet (1 mg total) by mouth daily. Calvert Cantor, MD Past Week Active Pharmacy Records, Child  furosemide (LASIX) 40 MG tablet [id number] No Take 1 tablet (40 mg total) by mouth daily. Take additional 40 mg if there is a weight gain of 3 lbs in1 day or 5 lbs in 1 week. Marinda Elk, MD 02/05/2023 Active Pharmacy Records, Child  HYDROcodone-acetaminophen [facility]) 10-325 MG tablet [id number] No Take 1 tablet by mouth every 8 (eight) hours as needed for moderate pain (pain score 4-6). [provider] Past Week Active Pharmacy Records, Child  Barkley Bruns [id number] No Take 1 tablet by mouth daily with breakfast. [provider] Past Week Active Pharmacy Records, Child  lactulose (CHRONULAC) 10 GM/15ML solution [id number] No Take 30 mLs (20 g total) by mouth 3 (three) times daily. Take to ensure that there is 2 loose bowel movement EVERY DAY  Patient taking differently: Take 30 mLs by mouth 3 (three) times daily as needed (to ensure there are 2 loose bowel movements EVERY DAY).   Rolly Salter, MD 02/05/2023 Active Pharmacy Records, Child  levothyroxine (SYNTHROID) 150 MCG tablet [id number] No Take 150 mcg by mouth daily before breakfast. [provider] 02/05/2023 Active Pharmacy Records, Child  lidocaine (LIDODERM) 5 % [id number] No Place 1 patch onto the skin daily. Remove & Discard patch within 12 hours or as directed by MD  Patient taking differently: Place 1 patch onto the skin daily as needed (for pain- Remove & Discard patch within 12 hours or as directed by MD).  Rolly Salter, MD Past Week Active Pharmacy Records, Child           Med Note Patience Musca   [facility] Dec 10, 2022 11:45 AM)    megestrol (MEGACE) 40 MG tablet [id number] No Take 1 tablet (40 mg total) by mouth 2 (two) times daily. Standley Brooking, MD 02/05/2023 Active Child, Pharmacy Records  methocarbamol (ROBAXIN) 500 MG tablet [id number] No Take 500 mg by mouth in the morning. [provider] 02/05/2023 Active Pharmacy Records, Child  midodrine (PROAMATINE) 10 MG tablet [id number] No Take 1 tablet (10 mg total) by mouth 3 (three) times daily with meals. Lewie Chamber, MD 02/05/2023 Active Pharmacy Records, Child  naloxone [facility]) nasal spray 4 mg/0.1 mL [id number] No Place 1 spray into the nose as needed (accidental overdose). [provider] Past Month Active Pharmacy Records, Child           Med Note Sherlon Handing, [facility] D   Tue Feb 07, 2023  9:40 AM) unknown  nystatin cream (MYCOSTATIN) [id number] No Apply topically 2 (two) times daily. Standley Brooking, MD Past Week Active Child, Pharmacy Records  nystatin powder [id number] No Apply 1 Application topically 3 (three) times daily as needed (for irritation- affected areas). [provider] Past Week Active Pharmacy Records, Child  pantoprazole (PROTONIX) 40 MG tablet [id number] No Take 1 tablet (40 mg total) by mouth 2 (two) times daily before a meal. Lewie Chamber, MD 02/05/2023 Active Pharmacy Records, Child  promethazine (PHENERGAN) 12.5 MG tablet [id number] No Take 12.5 mg by mouth every 6 (six) hours as needed for nausea or vomiting. [provider] Past Month Active Pharmacy Records, Child           Med Note Sherlon Handing, [facility] D   Tue Feb 07, 2023  9:41 AM) unknown  rifaximin (XIFAXAN) 550 MG TABS tablet [id number] No Take 550 mg by mouth 2 (two) times daily. [provider] 02/05/2023 Active Pharmacy Records, Child  rOPINIRole (REQUIP) 0.25 MG tablet [id number] No Take 1 tablet (0.25 mg total) by mouth at bedtime.  Patient taking differently: Take 0.25 mg by mouth daily.   Lewie Chamber, MD 02/05/2023 Active Pharmacy Records, Child  spironolactone (ALDACTONE) 50 MG tablet [id number]  Take 1 tablet (50 mg total) by mouth daily. Miguel Rota, MD  Active   tamsulosin (FLOMAX) 0.4 MG CAPS capsule [id number] No Take 1 capsule (0.4 mg total) by mouth daily. Lewie Chamber, MD 02/05/2023 Active Pharmacy Records, Child  triamcinolone cream (KENALOG) 0.5 % [id number] No Apply to affected area of right breast twice daily as directed.  Patient taking differently: Apply 1 Application topically 2 (two) times daily as needed (for irritation- right breast).   Osvaldo Shipper, MD Past Week Active Pharmacy Records, Child  venlafaxine XR (EFFEXOR-XR) 75 MG 24 hr capsule [id number] No Take 75 mg by mouth daily with breakfast. [provider] 02/05/2023 Active Pharmacy Records, Child  Vitamin D, Ergocalciferol, (DRISDOL) 50000 UNITS CAPS [id number] No Take 50,000 Units by mouth every Wednesday. [provider] 02/05/2023 Active Pharmacy Records, Child            Home Care and Equipment/Supplies: Were Home Health Services Ordered?: NA Any new equipment or medical supplies ordered?: NA  Functional Questionnaire: Do you need assistance with bathing/showering or dressing?: No Do you need assistance with meal preparation?: No Do you need assistance with eating?: No Do you have difficulty maintaining continence: No Do you need assistance with getting out of bed/getting out of  a chair/moving?: No Do you have difficulty managing or taking your medications?: No  Follow up appointments reviewed: PCP Follow-up appointment confirmed?: Yes Date of PCP follow-up appointment?: 03/06/23 Follow-up Provider: [facility] Follow-up appointment confirmed?: NA Do you need transportation to your follow-up appointment?: No Do you understand care options if your condition(s) worsen?: Yes-patient verbalized understanding    SIGNATURE Karena Addison, LPN [facility] Nurse Health Advisor Direct Dial [phone]

## 2023-02-22 ENCOUNTER — Other Ambulatory Visit: Payer: Self-pay | Admitting: Family Medicine

## 2023-02-22 DIAGNOSIS — D638 Anemia in other chronic diseases classified elsewhere: Secondary | ICD-10-CM | POA: Insufficient documentation

## 2023-02-22 MED ORDER — ATORVASTATIN CALCIUM 40 MG PO TABS: 40.0000 mg | ORAL_TABLET | Freq: Every morning | ORAL | 1 refills | Status: DC

## 2023-02-22 NOTE — Telephone Encounter (Signed)
Copied from CRM [id number]. Topic: Clinical - Medication Refill >> Feb 22, 2023 12:54 PM Herbert Seta B wrote: Most Recent Primary Care Visit:   Medication:  atorvastatin (LIPITOR) 40 MG tablet   Has the patient contacted their pharmacy? Yes-needs refill sent (Agent: If no, request that the patient contact the pharmacy for the refill. If patient does not wish to contact the pharmacy document the reason why and proceed with request.) (Agent: If yes, when and what did the pharmacy advise?)  Is this the correct pharmacy for this prescription? yes If no, delete pharmacy and type the correct one.  This is the patient's preferred pharmacy:  [id number], [place] - [address] AT [facility] [address] [place] [address] Phone: [phone] Fax: [phone]   Has the prescription been filled recently? no  Is the patient out of the medication? yes  Has the patient been seen for an appointment in the last year OR does the patient have an upcoming appointment? yes  Can we respond through MyChart? yes  Agent: Please be advised that Rx refills may take up to 3 business days. We ask that you follow-up with your pharmacy.

## 2023-02-22 NOTE — Telephone Encounter (Signed)
Last Fill: 01/19/22  Last OV: Unknown Next OV: 03/06/23 NP  Routing to provider for review/authorization.

## 2023-03-04 ENCOUNTER — Emergency Department (HOSPITAL_COMMUNITY)
Admission: EM | Admit: 2023-03-04 | Discharge: 2023-03-05 | Disposition: A | Discharge status: Home / Self Care | Payer: Medicare (Managed Care) | Attending: Emergency Medicine | Admitting: Emergency Medicine

## 2023-03-04 ENCOUNTER — Encounter (HOSPITAL_COMMUNITY): Payer: Self-pay | Admitting: Emergency Medicine

## 2023-03-04 ENCOUNTER — Other Ambulatory Visit: Payer: Self-pay

## 2023-03-04 ENCOUNTER — Emergency Department (HOSPITAL_COMMUNITY): Payer: Medicare (Managed Care)

## 2023-03-04 DIAGNOSIS — G8929 Other chronic pain: Secondary | ICD-10-CM | POA: Diagnosis not present

## 2023-03-04 DIAGNOSIS — Z86711 Personal history of pulmonary embolism: Secondary | ICD-10-CM | POA: Insufficient documentation

## 2023-03-04 DIAGNOSIS — R10819 Abdominal tenderness, unspecified site: Secondary | ICD-10-CM | POA: Diagnosis not present

## 2023-03-04 DIAGNOSIS — I6782 Cerebral ischemia: Secondary | ICD-10-CM | POA: Insufficient documentation

## 2023-03-04 DIAGNOSIS — K7460 Unspecified cirrhosis of liver: Secondary | ICD-10-CM | POA: Insufficient documentation

## 2023-03-04 DIAGNOSIS — D649 Anemia, unspecified: Secondary | ICD-10-CM | POA: Diagnosis not present

## 2023-03-04 DIAGNOSIS — K7581 Nonalcoholic steatohepatitis (NASH): Secondary | ICD-10-CM | POA: Insufficient documentation

## 2023-03-04 DIAGNOSIS — R404 Transient alteration of awareness: Secondary | ICD-10-CM | POA: Diagnosis not present

## 2023-03-04 DIAGNOSIS — R4182 Altered mental status, unspecified: Secondary | ICD-10-CM | POA: Diagnosis present

## 2023-03-04 LAB — CBC WITH DIFFERENTIAL/PLATELET
Abs Immature Granulocytes: 0.01 10*3/uL (ref 0.00–0.07)
Basophils Absolute: 0 10*3/uL (ref 0.0–0.1)
Basophils Relative: 1 %
Eosinophils Absolute: 0.1 10*3/uL (ref 0.0–0.5)
Eosinophils Relative: 2 %
HCT: 32.8 % — ABNORMAL LOW (ref 36.0–46.0)
Hemoglobin: 9.9 g/dL — ABNORMAL LOW (ref 12.0–15.0)
Immature Granulocytes: 0 %
Lymphocytes Relative: 17 %
Lymphs Abs: 1.1 10*3/uL (ref 0.7–4.0)
MCH: 28.8 pg (ref 26.0–34.0)
MCHC: 30.2 g/dL (ref 30.0–36.0)
MCV: 95.3 fL (ref 80.0–100.0)
Monocytes Absolute: 0.6 10*3/uL (ref 0.1–1.0)
Monocytes Relative: 9 %
Neutro Abs: 4.8 10*3/uL (ref 1.7–7.7)
Neutrophils Relative %: 71 %
Platelets: 159 10*3/uL (ref 150–400)
RBC: 3.44 MIL/uL — ABNORMAL LOW (ref 3.87–5.11)
RDW: 17.3 % — ABNORMAL HIGH (ref 11.5–15.5)
WBC: 6.7 10*3/uL (ref 4.0–10.5)
nRBC: 0 % (ref 0.0–0.2)

## 2023-03-04 LAB — I-STAT CG4 LACTIC ACID, ED: Lactic Acid, Venous: 1.5 mmol/L (ref 0.5–1.9)

## 2023-03-04 LAB — PROTIME-INR
INR: 1.7 — ABNORMAL HIGH (ref 0.8–1.2)
Prothrombin Time: 19.8 s — ABNORMAL HIGH (ref 11.4–15.2)

## 2023-03-04 LAB — RESP PANEL BY RT-PCR (RSV, FLU A&B, COVID)  RVPGX2
Influenza A by PCR: NEGATIVE
Influenza B by PCR: NEGATIVE
Resp Syncytial Virus by PCR: NEGATIVE
SARS Coronavirus 2 by RT PCR: NEGATIVE

## 2023-03-04 LAB — CBG MONITORING, ED: Glucose-Capillary: 113 mg/dL — ABNORMAL HIGH (ref 70–99)

## 2023-03-04 NOTE — ED Triage Notes (Signed)
  Patient BIB son for AMS that started earlier this morning.  Family states patient has hx of cirrhosis and elevated ammonia levels.  Family was instructed to give patient another dose of lactulose and she started to feel better.  Son went to grocery store and found patient altered when he returned.  Patient states she "feels off".   Pain 8/10, tenderness in abdomen.

## 2023-03-04 NOTE — ED Provider Notes (Signed)
 [place] EMERGENCY DEPARTMENT AT [facility] Provider Note   CSN: [id number] Arrival date & time: 03/04/23  2109     History {Add pertinent medical, surgical, social history, OB history to HPI:1} Chief Complaint  Patient presents with  . Altered Mental Status    [name] is a 68 y.o. female with history of cirrhosis secondary to NASH and recent admission for hepatic encephalopathy, chronic hypotension on midodrine, history of PE without anticoagulation, short gut syndrome. Presents to the ED this evening with report of altered mental status that started today.  Patient states family took an extra dose of her lactulose today and was initially feeling better but was found to be altered again this evening when her son returned from the store.  Patient also reporting tenderness in the abdomen with pain 8 out of 10.  In addition to the above listed history patient with history of lumbar radiculopathy and bacteremia in the past no anticoagulation.  Per chart review, patient does have hx of chronic abdominal pain. 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 Baseline BLE edema, improved from baseline at this time.    HPI     Home Medications Prior to Admission medications   Medication Sig Start Date End Date Taking? Authorizing Provider  atorvastatin (LIPITOR) 40 MG tablet Take 1 tablet (40 mg total) by mouth in the morning. 02/22/23   [name], MD  colchicine 0.6 MG tablet Take 0.6 mg by mouth daily as needed (gout flares).    [provider]  cyanocobalamin (VITAMIN B12) 1000 MCG tablet Take 2,000 mcg by mouth every Friday.    [provider]  dicyclomine (BENTYL) 10 MG capsule Take 1 capsule (10 mg total) by mouth 3 (three) times daily as needed for spasms (abdominal cramping). 09/09/22   Lewie Chamber, MD  famotidine (PEPCID) 20 MG tablet Take 20 mg by mouth in the morning.    [provider]  folic acid (FOLVITE) 1 MG tablet Take 1 tablet (1 mg  total) by mouth daily. 06/16/22   Calvert Cantor, MD  furosemide (LASIX) 40 MG tablet Take 1 tablet (40 mg total) by mouth daily. Take additional 40 mg if there is a weight gain of 3 lbs in1 day or 5 lbs in 1 week. 12/17/22   Marinda Elk, MD  HYDROcodone-acetaminophen (NORCO) 10-325 MG tablet Take 1 tablet by mouth every 8 (eight) hours as needed for moderate pain (pain score 4-6). 12/19/22   [provider]  IRON-VITAMIN C PO Take 1 tablet by mouth daily with breakfast.    [provider]  lactulose (CHRONULAC) 10 GM/15ML solution Take 30 mLs (20 g total) by mouth 3 (three) times daily. Take to ensure that there is 2 loose bowel movement EVERY DAY Patient taking differently: Take 30 mLs by mouth 3 (three) times daily as needed (to ensure there are 2 loose bowel movements EVERY DAY). 10/31/22   Rolly Salter, MD  levothyroxine (SYNTHROID) 150 MCG tablet Take 150 mcg by mouth daily before breakfast. 10/26/21   [provider]  lidocaine (LIDODERM) 5 % Place 1 patch onto the skin daily. Remove & Discard patch within 12 hours or as directed by MD Patient taking differently: Place 1 patch onto the skin daily as needed (for pain- Remove & Discard patch within 12 hours or as directed by MD). 10/31/22   Rolly Salter, MD  megestrol (MEGACE) 40 MG tablet Take 1 tablet (40 mg total) by mouth 2 (two) times  daily. 01/13/23   Standley Brooking, MD  methocarbamol (ROBAXIN) 500 MG tablet Take 500 mg by mouth in the morning. 03/04/22   [provider]  midodrine (PROAMATINE) 10 MG tablet Take 1 tablet (10 mg total) by mouth 3 (three) times daily with meals. 09/09/22   Lewie Chamber, MD  naloxone [facility]) nasal spray 4 mg/0.1 mL Place 1 spray into the nose as needed (accidental overdose). 03/25/22   [provider]  nystatin cream (MYCOSTATIN) Apply topically 2 (two) times daily. 01/13/23   Standley Brooking, MD  nystatin powder Apply 1 Application topically 3 (three)  times daily as needed (for irritation- affected areas).    [provider]  pantoprazole (PROTONIX) 40 MG tablet Take 1 tablet (40 mg total) by mouth 2 (two) times daily before a meal. 09/09/22   Lewie Chamber, MD  promethazine (PHENERGAN) 12.5 MG tablet Take 12.5 mg by mouth every 6 (six) hours as needed for nausea or vomiting.    [provider]  rifaximin (XIFAXAN) 550 MG TABS tablet Take 550 mg by mouth 2 (two) times daily.    [provider]  rOPINIRole (REQUIP) 0.25 MG tablet Take 1 tablet (0.25 mg total) by mouth at bedtime. Patient taking differently: Take 0.25 mg by mouth daily. 09/09/22   Lewie Chamber, MD  spironolactone (ALDACTONE) 50 MG tablet Take 1 tablet (50 mg total) by mouth daily. 02/09/23   Amin, Ankit C, MD  tamsulosin (FLOMAX) 0.4 MG CAPS capsule Take 1 capsule (0.4 mg total) by mouth daily. 09/10/22   Lewie Chamber, MD  triamcinolone cream (KENALOG) 0.5 % Apply to affected area of right breast twice daily as directed. Patient taking differently: Apply 1 Application topically 2 (two) times daily as needed (for irritation- right breast). 05/10/22   Osvaldo Shipper, MD  venlafaxine XR (EFFEXOR-XR) 75 MG 24 hr capsule Take 75 mg by mouth daily with breakfast. 08/17/21   [provider]  Vitamin D, Ergocalciferol, (DRISDOL) 50000 UNITS CAPS Take 50,000 Units by mouth every Wednesday.    [provider]      Allergies    Ace inhibitors, Ivp dye [iodinated contrast media], Desitin [zinc oxide], Gadolinium derivatives, Hydroxyzine, Chlorhexidine, Doxycycline, Penicillins, and Zofran [ondansetron]    Review of Systems   Review of Systems  Physical Exam Updated Vital Signs BP (!) 142/92 (BP Location: Left Arm)   Pulse 78   Temp 97.6 F (36.4 C) (Oral)   Resp 20   Ht 5\' 2"  (1.575 m)   Wt 107.5 kg   SpO2 100%   BMI 43.35 kg/m  Physical Exam  ED Results / Procedures / Treatments   Labs (all labs ordered are listed, but only  abnormal results are displayed) Labs Reviewed  RESP PANEL BY RT-PCR (RSV, FLU A&B, COVID)  RVPGX2  COMPREHENSIVE METABOLIC PANEL  CBC WITH DIFFERENTIAL/PLATELET  URINALYSIS, ROUTINE W REFLEX MICROSCOPIC  AMMONIA  ETHANOL  CBG MONITORING, ED    EKG None  Radiology No results found.  Procedures Procedures  {Document cardiac monitor, telemetry assessment procedure when appropriate:1}  Medications Ordered in ED Medications - No data to display  ED Course/ Medical Decision Making/ A&P   {   Click here for ABCD2, HEART and other calculatorsREFRESH Note before signing :1}                              Medical Decision Making   The differential diagnosis for AMS is  extensive and includes, but is not limited to:  Drug overdose - opioids, alcohol, sedatives, antipsychotics, drug withdrawal, others Metabolic: hypoxia, hypoglycemia, hyperglycemia, hypercalcemia, hypernatremia, hyponatremia, uremia, hepatic encephalopathy, hypothyroidism, hyperthyroidism, vitamin B12 or thiamine deficiency, carbon monoxide poisoning, Wilson's disease, Lactic acidosis, DKA/HHOS Infectious: meningitis, encephalitis, bacteremia/sepsis, urinary tract infection, pneumonia, neurosyphilis Structural: Space-occupying lesion, (brain tumor, subdural hematoma, hydrocephalus,) Vascular: stroke, subarachnoid hemorrhage, coronary ischemia, hypertensive encephalopathy, CNS vasculitis, thrombotic thrombocytopenic purpura, disseminated intravascular coagulation, hyperviscosity Psychiatric: Schizophrenia, depression; Other: Seizure, hypothermia, heat stroke, ICU psychosis, dementia -"sundowning."    Amount and/or Complexity of Data Reviewed Labs: ordered. Radiology: ordered.   ***  {Document critical care time when appropriate:1} {Document review of labs and clinical decision tools ie heart score, Chads2Vasc2 etc:1}  {Document your independent review of radiology images, and any outside records:1} {Document your  discussion with family members, caretakers, and with consultants:1} {Document social determinants of health affecting pt's care:1} {Document your decision making why or why not admission, treatments were needed:1} Final Clinical Impression(s) / ED Diagnoses Final diagnoses:  None    Rx / DC Orders ED Discharge Orders     None

## 2023-03-05 ENCOUNTER — Emergency Department (HOSPITAL_COMMUNITY): Payer: Medicare (Managed Care)

## 2023-03-05 DIAGNOSIS — K7682 Hepatic encephalopathy: Secondary | ICD-10-CM | POA: Diagnosis not present

## 2023-03-05 LAB — URINALYSIS, ROUTINE W REFLEX MICROSCOPIC
Bacteria, UA: NONE SEEN
Bilirubin Urine: NEGATIVE
Glucose, UA: NEGATIVE mg/dL
Hgb urine dipstick: NEGATIVE
Ketones, ur: NEGATIVE mg/dL
Leukocytes,Ua: NEGATIVE
Nitrite: NEGATIVE
Protein, ur: NEGATIVE mg/dL
Specific Gravity, Urine: 1.017 (ref 1.005–1.030)
pH: 5 (ref 5.0–8.0)

## 2023-03-05 LAB — RAPID URINE DRUG SCREEN, HOSP PERFORMED
Amphetamines: NOT DETECTED
Barbiturates: NOT DETECTED
Benzodiazepines: NOT DETECTED
Cocaine: NOT DETECTED
Opiates: NOT DETECTED
Tetrahydrocannabinol: NOT DETECTED

## 2023-03-05 LAB — AMMONIA: Ammonia: 50 umol/L — ABNORMAL HIGH (ref 9–35)

## 2023-03-05 LAB — I-STAT CG4 LACTIC ACID, ED: Lactic Acid, Venous: 1.1 mmol/L (ref 0.5–1.9)

## 2023-03-05 LAB — COMPREHENSIVE METABOLIC PANEL
ALT: 20 U/L (ref 0–44)
AST: 27 U/L (ref 15–41)
Albumin: 2.7 g/dL — ABNORMAL LOW (ref 3.5–5.0)
Alkaline Phosphatase: 88 U/L (ref 38–126)
Anion gap: 5 (ref 5–15)
BUN: 22 mg/dL (ref 8–23)
CO2: 20 mmol/L — ABNORMAL LOW (ref 22–32)
Calcium: 8.7 mg/dL — ABNORMAL LOW (ref 8.9–10.3)
Chloride: 110 mmol/L (ref 98–111)
Creatinine, Ser: 1.06 mg/dL — ABNORMAL HIGH (ref 0.44–1.00)
GFR, Estimated: 58 mL/min — ABNORMAL LOW (ref >60)
Glucose, Bld: 138 mg/dL — ABNORMAL HIGH (ref 70–99)
Potassium: 4.3 mmol/L (ref 3.5–5.1)
Sodium: 135 mmol/L (ref 135–145)
Total Bilirubin: 1.6 mg/dL — ABNORMAL HIGH (ref 0.0–1.2)
Total Protein: 6.1 g/dL — ABNORMAL LOW (ref 6.5–8.1)

## 2023-03-05 LAB — MAGNESIUM: Magnesium: 1.9 mg/dL (ref 1.7–2.4)

## 2023-03-05 LAB — TROPONIN I (HIGH SENSITIVITY)
Troponin I (High Sensitivity): 29 ng/L — ABNORMAL HIGH (ref <18)
Troponin I (High Sensitivity): 28 ng/L — ABNORMAL HIGH (ref <18)

## 2023-03-05 LAB — LIPASE, BLOOD: Lipase: 56 U/L — ABNORMAL HIGH (ref 11–51)

## 2023-03-05 MED ORDER — AZITHROMYCIN 250 MG PO TABS
500.0000 mg | ORAL_TABLET | Freq: Once | ORAL | Status: AC
  Administered 2023-03-05: 500 mg via ORAL
  Filled 2023-03-05: qty 2

## 2023-03-05 MED ORDER — LORAZEPAM 2 MG/ML IJ SOLN
0.5000 mg | Freq: Once | INTRAMUSCULAR | Status: AC
  Administered 2023-03-05: 0.5 mg via INTRAVENOUS
  Filled 2023-03-05: qty 1

## 2023-03-05 MED ORDER — AZITHROMYCIN 250 MG PO TABS: 250.0000 mg | ORAL_TABLET | Freq: Every day | ORAL | 0 refills | Status: DC

## 2023-03-05 NOTE — ED Notes (Signed)
 Patient transported to CT

## 2023-03-05 NOTE — Discharge Instructions (Addendum)
 Please continue to follow-up with your gastroenterologist regarding management of your cirrhosis.  Suspect your transient alteration in mental status was likely related to elevated ammonia levels which you treated at home with extra dose of your lactulose.  Your workup today was otherwise reassuring.  Please follow-up with your primary care doctor and return to the ER with any severe symptoms. additionally you have changes on your chest x-ray suggestive of possible mild pneumonia, please take the prescribed antibiotic for the next 5 days and return to the ER with any new severe symptom.

## 2023-03-06 ENCOUNTER — Ambulatory Visit: Payer: Medicare (Managed Care) | Admitting: Family Medicine

## 2023-03-06 ENCOUNTER — Encounter: Payer: Self-pay | Admitting: Family Medicine

## 2023-03-06 ENCOUNTER — Ambulatory Visit (INDEPENDENT_AMBULATORY_CARE_PROVIDER_SITE_OTHER): Payer: Medicare (Managed Care) | Admitting: Family Medicine

## 2023-03-06 VITALS — BP 122/74 | HR 82 | Temp 98.2°F | Ht 62.0 in | Wt 208.5 lb

## 2023-03-06 DIAGNOSIS — K7290 Hepatic failure, unspecified without coma: Secondary | ICD-10-CM | POA: Diagnosis not present

## 2023-03-06 DIAGNOSIS — L299 Pruritus, unspecified: Secondary | ICD-10-CM | POA: Insufficient documentation

## 2023-03-06 DIAGNOSIS — Z780 Asymptomatic menopausal state: Secondary | ICD-10-CM

## 2023-03-06 DIAGNOSIS — K7460 Unspecified cirrhosis of liver: Secondary | ICD-10-CM

## 2023-03-06 DIAGNOSIS — I9589 Other hypotension: Secondary | ICD-10-CM

## 2023-03-06 DIAGNOSIS — G4700 Insomnia, unspecified: Secondary | ICD-10-CM | POA: Insufficient documentation

## 2023-03-06 DIAGNOSIS — S22000A Wedge compression fracture of unspecified thoracic vertebra, initial encounter for closed fracture: Secondary | ICD-10-CM

## 2023-03-06 DIAGNOSIS — G4709 Other insomnia: Secondary | ICD-10-CM

## 2023-03-06 DIAGNOSIS — S22000D Wedge compression fracture of unspecified thoracic vertebra, subsequent encounter for fracture with routine healing: Secondary | ICD-10-CM | POA: Diagnosis not present

## 2023-03-06 DIAGNOSIS — R600 Localized edema: Secondary | ICD-10-CM

## 2023-03-06 MED ORDER — TRIAMCINOLONE ACETONIDE 0.1 % EX CREA: TOPICAL_CREAM | CUTANEOUS | 1 refills | Status: DC

## 2023-03-06 NOTE — Progress Notes (Signed)
 Patient Office Visit  Assessment & Plan:  Pruritus -     Triamcinolone Acetonide; Apply BID to affected areas.  Dispense: 80 g; Refill: 1  Edema of both lower legs  Other insomnia  Postmenopausal -     DG Bone Density; Future  Decompensated cirrhosis (HCC)  Other specified hypotension  Compression fracture of body of thoracic vertebra [facility]) Assessment & Plan: T12 compression fracture October 2024  Orders: -     DG Bone Density; Future   Test results were reviewed and analyzed as part of the medical decision making of this visit.  Reviewed previous hospital notes and ER notes during the office visit.  Also reviewed previous labs that were done February 22 in [facility].  Patient can use triamcinolone cream over her rash especially of her back.  Patient can continue to try the over-the-counter melatonin and increase gradually.  Patient will follow-up with OB/GYN regarding postmenopausal bleeding. DEXA scan ordered due to previous hx of compression fracture.  RTC 3 mos or sooner if nec.  No follow-ups on file.   Subjective:    Patient ID: [name], female    DOB: [birth year]  Age: 68 y.o. MRN: [id number]  Chief Complaint  Patient presents with   Medical Management of Chronic Issues   Establish Care    HPI Cirrhosis with recurrent hospital admissions for hepatic encephalopathy/mental status changes.  Pt went to ED Feb 22nd due to transient alteration of awareness. Pt had acceptable ammonia level. Pt does take Lactulose consistently. Pt has seen [facility] in [place] Feb 11th and discovered that her copper level is very low. Pt now needs to to do 24 hour urine for copper. Pt not sure if she will take copper by mouth or require IV copper.  With history of cirrhosis secondary to NASH and recent admission for hepatic encephalopathy, chronic hypotension on midodrine, history of PE without anticoagulation, short gut syndrome. Presents to the ED this evening with report of altered mental  status that started today.  Patient states family took an extra dose of her lactulose today and was initially feeling better but was found to be altered again this evening when her son returned from the store.  Patient also reporting tenderness in the abdomen with pain 8 out of 10.  In addition to the above listed history patient with history of lumbar radiculopathy and bacteremia in the past no anticoagulation.  Severe Pruritus- due to cirrhosis-patient did take hydroxyzine in the past but was affecting her mental cognitive functioning.  It caused a lot of sedation which is not good for now given her history.  Patient has used steroid creams in the past and they do help. EDEMA- Patient complains of bilateral swelling. Pt does have Grade 1 diastolic dysfunction ONSET: ongoing, mild TIMING: ongoing PROGRESSION: improving overall  CHARACTER: Tense RELIEF FROM: Lasix 40mg  in AM, aldactone in AM HOME TREATMENT: Lasix 40mg  in AM.  ASSOCIATED SYMPTOMS: Subjective Fever (No), Chills (No), Pain (No), Redness (No), Drainage (No), Shortness of breath (No), Chest pain (No), Weeping (No) RISK FACTORS: -- Hx of CHF (no but has diastolic dysfunction) -- Hx of renal disease (No) -- Hx of hypertension (Not any more), pt has to take midridine -- Hx of venous insufficiency (No) -- Hx of liver disease (yes) -- Hx of cancer (No) -- Obesity (yes, but has lost weight) -- Trauma (No) -- Smoking (no, former smoker) -- New Medication (no)\ Insomnia- pt struggling with insomnia. Son gave her children's gummy Melatonin but  did not work. Pt currently on Hydrocodone but taking less than Rx. Pt aware that we need to be cautious with sleep aids. Pt cannot take Ambien with her meds. Pt not sure if she tried Trazodone.  Postmenopausal bleeding- saw OB-GYN last year- has to have another follow up, waiting to here from her OB-GYN office. Pt was told she had calcified fibroids. Pt has not had another episode of bleeding.   Compression fracture-patient had a kyphoplasty last October due to compression fracture.  Patient does not think she ever did a bone density scan and is open to this idea.  Patient is not taking any bone building medication i.e. Fosamax or Prolia. Deconditioned- pt has done PT and is able to walk using her walker at home.   The ASCVD Risk score (Arnett DK, et al., 2019) failed to calculate for the following reasons:   The valid total cholesterol range is 130 to 320 mg/dL  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   Obesity    Pulmonary emboli (HCC) 01/10/2009   Pulmonary embolism (HCC)    Short gut syndrome    Thyroid disease    Past Surgical History:  Procedure Laterality Date   ABDOMINAL WALL MESH  REMOVAL     2016   CESAREAN SECTION WITH BILATERAL TUBAL LIGATION  04/30/1985   CHOLECYSTECTOMY  05/2007   Exploratory laparotomy, lysis of adhesions, takedown of ileostomy with small bowel resection, open cholecystectomy and ileocolostomy   COLON SURGERY  01/2007   exploratory laparotomy with right colecotmy and end ileostomy   COLONOSCOPY  04/02/2019   Dr Clifton James Elie Confer, MD [facility]   ESOPHAGOGASTRODUODENOSCOPY (EGD) WITH PROPOFOL N/A 10/19/2022   Procedure: ESOPHAGOGASTRODUODENOSCOPY (EGD) WITH PROPOFOL;  Surgeon: Kerin Salen, MD;  Location: [facility];  Service: Gastroenterology;  Laterality: N/A;   ESOPHAGOSCOPY  04/02/2019   EYE SURGERY Left 10/06/2020   Cataract extraciton w/intraocular lens implant- Dr Valere Dross    HERNIA REPAIR  10/2007   ILEOSTOMY  2009   states bowel perfotation wih colonsocopy   ILEOSTOMY CLOSURE  2009   INCISIONAL HERNIA REPAIR  10/11/2018   IR KYPHO THORACIC WITH BONE BIOPSY  08/31/2022   IR KYPHO THORACIC WITH BONE BIOPSY  10/27/2022   IR RADIOLOGIST EVAL & MGMT  09/27/2022   KIDNEY STONE SURGERY     LIVER RESECTION     2009   PANNICULECTOMY     repair of abdominal wall hernia   WRIST SURGERY     tendonitis   Social History   Tobacco Use   Smoking status: Former    Current packs/day: 0.00    Types: Cigarettes    Quit date: 1987    Years since quitting: 38.1    Passive exposure: Past   Smokeless tobacco: Never  Vaping Use   Vaping status: Never Used  Substance Use Topics   Alcohol use: No   Drug use: No   Family History  Problem Relation Age of Onset  Uterine cancer Mother    Bone cancer Father    Heart disease Father    Hyperlipidemia Father    Dementia Father    Hypertension Father    Skin cancer Sister    Hypertension Sister    Diabetes Sister    Hyperlipidemia Brother    Hypertension Brother    Heart disease Brother    Allergies  Allergen Reactions   Ace Inhibitors Anaphylaxis and Swelling   Ivp Dye [Iodinated Contrast Media] Anaphylaxis   Desitin [Zinc Oxide] Rash and Other (See Comments)    Worsens rash   Gadolinium Derivatives Other (See Comments)    Reaction??   Hydroxyzine Other (See Comments)    Affects the mind   Chlorhexidine Rash and Other (See Comments)    WORSENS RASHES   Doxycycline Rash   Penicillins Rash   Zofran [Ondansetron] Rash    ROS    Objective:    BP 122/74   Pulse 82   Temp 98.2 F (36.8 C)   Ht 5\' 2"  (1.575 m)   Wt 208 lb 8 oz (94.6 kg)   SpO2 98%   BMI 38.14 kg/m  BP Readings from Last 3 Encounters:  03/06/23 122/74  03/05/23 (!) 112/57  02/09/23 (!) 110/59   Wt Readings from Last 3 Encounters:  03/06/23 208 lb 8 oz (94.6 kg)  03/04/23 237 lb (107.5 kg)  02/06/23 237 lb 3.4 oz (107.6 kg)     Physical Exam Vitals and nursing note reviewed.  Constitutional:      Appearance: Normal appearance.     Comments: Pt comes with her son, using wheelchair today  HENT:     Head: Normocephalic.     Right Ear: There is impacted cerumen.     Left Ear: There is impacted cerumen.  Eyes:     Comments: Pt has left eye scarring (blind left eye)   Cardiovascular:     Rate and Rhythm: Normal rate and regular rhythm.     Heart sounds: Murmur heard.  Pulmonary:     Effort: Pulmonary effort is normal.     Breath sounds: Rales present.     Comments: Has coarse rales right lower base.  Musculoskeletal:     Right lower leg: Edema present.     Left lower leg: Edema present.     Comments: Lower extremity edema has improved compared to previous visits.   Skin:    Findings: Rash present.     Comments: Pt is scratching herself during office visit (son also is scratching her back)  Neurological:     General: No focal deficit present.     Mental Status: She is alert and oriented to person, place, and time.  Psychiatric:        Mood and Affect: Mood normal.        Behavior: Behavior normal.        Thought Content: Thought content normal.      Results for orders placed or performed in visit on 03/06/23  HM MAMMOGRAPHY  Result Value Ref Range   HM Mammogram Self Reported Normal 0-4 Bi-Rad, Self Reported Normal

## 2023-03-06 NOTE — Assessment & Plan Note (Signed)
 T12 compression fracture October 2024

## 2023-03-07 ENCOUNTER — Inpatient Hospital Stay (HOSPITAL_COMMUNITY)
Admission: EM | Admit: 2023-03-07 | Discharge: 2023-03-13 | DRG: 443 | Disposition: A | Discharge status: Home Health | Payer: Medicare (Managed Care) | Attending: Family Medicine | Admitting: Family Medicine

## 2023-03-07 ENCOUNTER — Emergency Department (HOSPITAL_COMMUNITY): Payer: Medicare (Managed Care)

## 2023-03-07 DIAGNOSIS — R5381 Other malaise: Secondary | ICD-10-CM | POA: Diagnosis present

## 2023-03-07 DIAGNOSIS — Z87442 Personal history of urinary calculi: Secondary | ICD-10-CM

## 2023-03-07 DIAGNOSIS — M549 Dorsalgia, unspecified: Secondary | ICD-10-CM | POA: Diagnosis present

## 2023-03-07 DIAGNOSIS — D638 Anemia in other chronic diseases classified elsewhere: Secondary | ICD-10-CM | POA: Diagnosis present

## 2023-03-07 DIAGNOSIS — Z9841 Cataract extraction status, right eye: Secondary | ICD-10-CM

## 2023-03-07 DIAGNOSIS — Z880 Allergy status to penicillin: Secondary | ICD-10-CM

## 2023-03-07 DIAGNOSIS — T473X6A Underdosing of saline and osmotic laxatives, initial encounter: Secondary | ICD-10-CM | POA: Diagnosis present

## 2023-03-07 DIAGNOSIS — G8929 Other chronic pain: Secondary | ICD-10-CM | POA: Diagnosis present

## 2023-03-07 DIAGNOSIS — Z833 Family history of diabetes mellitus: Secondary | ICD-10-CM

## 2023-03-07 DIAGNOSIS — Z8249 Family history of ischemic heart disease and other diseases of the circulatory system: Secondary | ICD-10-CM

## 2023-03-07 DIAGNOSIS — Z79890 Hormone replacement therapy (postmenopausal): Secondary | ICD-10-CM

## 2023-03-07 DIAGNOSIS — Z86718 Personal history of other venous thrombosis and embolism: Secondary | ICD-10-CM

## 2023-03-07 DIAGNOSIS — Z808 Family history of malignant neoplasm of other organs or systems: Secondary | ICD-10-CM

## 2023-03-07 DIAGNOSIS — K7581 Nonalcoholic steatohepatitis (NASH): Secondary | ICD-10-CM | POA: Diagnosis present

## 2023-03-07 DIAGNOSIS — Y92009 Unspecified place in unspecified non-institutional (private) residence as the place of occurrence of the external cause: Secondary | ICD-10-CM

## 2023-03-07 DIAGNOSIS — I10 Essential (primary) hypertension: Secondary | ICD-10-CM | POA: Diagnosis present

## 2023-03-07 DIAGNOSIS — Z9049 Acquired absence of other specified parts of digestive tract: Secondary | ICD-10-CM

## 2023-03-07 DIAGNOSIS — Z961 Presence of intraocular lens: Secondary | ICD-10-CM | POA: Diagnosis present

## 2023-03-07 DIAGNOSIS — Z83438 Family history of other disorder of lipoprotein metabolism and other lipidemia: Secondary | ICD-10-CM

## 2023-03-07 DIAGNOSIS — D696 Thrombocytopenia, unspecified: Secondary | ICD-10-CM | POA: Diagnosis present

## 2023-03-07 DIAGNOSIS — K439 Ventral hernia without obstruction or gangrene: Secondary | ICD-10-CM | POA: Diagnosis present

## 2023-03-07 DIAGNOSIS — K7460 Unspecified cirrhosis of liver: Secondary | ICD-10-CM | POA: Diagnosis present

## 2023-03-07 DIAGNOSIS — K7682 Hepatic encephalopathy: Secondary | ICD-10-CM | POA: Diagnosis not present

## 2023-03-07 DIAGNOSIS — Z6838 Body mass index (BMI) 38.0-38.9, adult: Secondary | ICD-10-CM

## 2023-03-07 DIAGNOSIS — Z888 Allergy status to other drugs, medicaments and biological substances status: Secondary | ICD-10-CM

## 2023-03-07 DIAGNOSIS — Z91128 Patient's intentional underdosing of medication regimen for other reason: Secondary | ICD-10-CM

## 2023-03-07 DIAGNOSIS — E669 Obesity, unspecified: Secondary | ICD-10-CM | POA: Diagnosis present

## 2023-03-07 DIAGNOSIS — Z66 Do not resuscitate: Secondary | ICD-10-CM | POA: Diagnosis present

## 2023-03-07 DIAGNOSIS — Z8049 Family history of malignant neoplasm of other genital organs: Secondary | ICD-10-CM

## 2023-03-07 DIAGNOSIS — E66812 Obesity, class 2: Secondary | ICD-10-CM | POA: Diagnosis present

## 2023-03-07 DIAGNOSIS — E039 Hypothyroidism, unspecified: Secondary | ICD-10-CM | POA: Diagnosis present

## 2023-03-07 DIAGNOSIS — Z86711 Personal history of pulmonary embolism: Secondary | ICD-10-CM

## 2023-03-07 DIAGNOSIS — Z79899 Other long term (current) drug therapy: Secondary | ICD-10-CM

## 2023-03-07 DIAGNOSIS — Z87891 Personal history of nicotine dependence: Secondary | ICD-10-CM

## 2023-03-07 LAB — COMPREHENSIVE METABOLIC PANEL
ALT: 25 U/L (ref 0–44)
AST: 38 U/L (ref 15–41)
Albumin: 3.1 g/dL — ABNORMAL LOW (ref 3.5–5.0)
Alkaline Phosphatase: 112 U/L (ref 38–126)
Anion gap: 7 (ref 5–15)
BUN: 28 mg/dL — ABNORMAL HIGH (ref 8–23)
CO2: 20 mmol/L — ABNORMAL LOW (ref 22–32)
Calcium: 9.1 mg/dL (ref 8.9–10.3)
Chloride: 111 mmol/L (ref 98–111)
Creatinine, Ser: 1.12 mg/dL — ABNORMAL HIGH (ref 0.44–1.00)
GFR, Estimated: 55 mL/min — ABNORMAL LOW (ref >60)
Glucose, Bld: 113 mg/dL — ABNORMAL HIGH (ref 70–99)
Potassium: 3.4 mmol/L — ABNORMAL LOW (ref 3.5–5.1)
Sodium: 138 mmol/L (ref 135–145)
Total Bilirubin: 1.6 mg/dL — ABNORMAL HIGH (ref 0.0–1.2)
Total Protein: 6.9 g/dL (ref 6.5–8.1)

## 2023-03-07 LAB — CBC WITH DIFFERENTIAL/PLATELET
Abs Immature Granulocytes: 0.01 10*3/uL (ref 0.00–0.07)
Basophils Absolute: 0 10*3/uL (ref 0.0–0.1)
Basophils Relative: 1 %
Eosinophils Absolute: 0.4 10*3/uL (ref 0.0–0.5)
Eosinophils Relative: 7 %
HCT: 52.7 % — ABNORMAL HIGH (ref 36.0–46.0)
Hemoglobin: 15.7 g/dL — ABNORMAL HIGH (ref 12.0–15.0)
Immature Granulocytes: 0 %
Lymphocytes Relative: 31 %
Lymphs Abs: 1.8 10*3/uL (ref 0.7–4.0)
MCH: 28.7 pg (ref 26.0–34.0)
MCHC: 29.8 g/dL — ABNORMAL LOW (ref 30.0–36.0)
MCV: 96.3 fL (ref 80.0–100.0)
Monocytes Absolute: 0.5 10*3/uL (ref 0.1–1.0)
Monocytes Relative: 8 %
Neutro Abs: 3.1 10*3/uL (ref 1.7–7.7)
Neutrophils Relative %: 53 %
Platelets: 169 10*3/uL (ref 150–400)
RBC: 5.47 MIL/uL — ABNORMAL HIGH (ref 3.87–5.11)
RDW: 18.2 % — ABNORMAL HIGH (ref 11.5–15.5)
WBC: 5.8 10*3/uL (ref 4.0–10.5)
nRBC: 0 % (ref 0.0–0.2)

## 2023-03-07 LAB — CBG MONITORING, ED: Glucose-Capillary: 108 mg/dL — ABNORMAL HIGH (ref 70–99)

## 2023-03-07 LAB — URINALYSIS, ROUTINE W REFLEX MICROSCOPIC
Bilirubin Urine: NEGATIVE
Glucose, UA: NEGATIVE mg/dL
Hgb urine dipstick: NEGATIVE
Ketones, ur: NEGATIVE mg/dL
Leukocytes,Ua: NEGATIVE
Nitrite: NEGATIVE
Protein, ur: NEGATIVE mg/dL
Specific Gravity, Urine: 1.009 (ref 1.005–1.030)
pH: 5 (ref 5.0–8.0)

## 2023-03-07 LAB — PROTIME-INR
INR: 1.5 — ABNORMAL HIGH (ref 0.8–1.2)
Prothrombin Time: 18.2 s — ABNORMAL HIGH (ref 11.4–15.2)

## 2023-03-07 LAB — AMMONIA: Ammonia: 104 umol/L — ABNORMAL HIGH (ref 9–35)

## 2023-03-07 LAB — APTT: aPTT: 38 s — ABNORMAL HIGH (ref 24–36)

## 2023-03-07 NOTE — ED Triage Notes (Signed)
 Pt arrives POV with son who recounts that she was recently seen for confusion thought to be secondary to cirrhosis. Pt was discharged that night and went home somewhat confused. Son states that she was back at baseline, but then today she began having some mild confusion about things she would normally know such as where the bathroom is. Pt without any weakness noted during triage. Pt c/o abd pain without N/V/D.

## 2023-03-07 NOTE — ED Provider Triage Note (Signed)
 Emergency Medicine Provider Triage Evaluation Note  [name] , a 68 y.o. female  was evaluated in triage.  Pt complains of altered mental status.  Patient with son who reports that she has been increasingly confused over the last several days.  Patient is alert and oriented to self, location, disoriented to time.  No recent nausea, vomiting, fever, chills or bodyaches.  Patient was recently diagnosed with pneumonia and reportedly started on azithromycin but has not started the medication.  Is also been seen recently on 03/04/2023 for transient alteration of of awareness, in 02/06/2023 for hepatic encephalopathy.  Review of Systems  Positive: As above Negative: As above  Physical Exam  BP 138/87 (BP Location: Right Arm)   Pulse (!) 103   Temp 99 F (37.2 C) (Oral)   Resp 18   SpO2 100%  Gen:   Awake, no distress   Resp:  Normal effort  MSK:   Moves extremities without difficulty  Other:  No scleral icterus, patient appears to be possibly slightly more jaundiced, some palpable hepatomegaly  Medical Decision Making  Medically screening exam initiated at 4:39 PM.  Appropriate orders placed.  [name] was informed that the remainder of the evaluation will be completed by another provider, this initial triage assessment does not replace that evaluation, and the importance of remaining in the ED until their evaluation is complete.     Smitty Knudsen, PA-C 03/07/23 [phone]

## 2023-03-08 ENCOUNTER — Encounter (HOSPITAL_COMMUNITY): Payer: Self-pay | Admitting: Emergency Medicine

## 2023-03-08 ENCOUNTER — Other Ambulatory Visit: Payer: Self-pay

## 2023-03-08 DIAGNOSIS — Z87891 Personal history of nicotine dependence: Secondary | ICD-10-CM | POA: Diagnosis not present

## 2023-03-08 DIAGNOSIS — Z9049 Acquired absence of other specified parts of digestive tract: Secondary | ICD-10-CM | POA: Diagnosis not present

## 2023-03-08 DIAGNOSIS — Z91128 Patient's intentional underdosing of medication regimen for other reason: Secondary | ICD-10-CM | POA: Diagnosis not present

## 2023-03-08 DIAGNOSIS — Z880 Allergy status to penicillin: Secondary | ICD-10-CM | POA: Diagnosis not present

## 2023-03-08 DIAGNOSIS — K7460 Unspecified cirrhosis of liver: Secondary | ICD-10-CM

## 2023-03-08 DIAGNOSIS — Z86711 Personal history of pulmonary embolism: Secondary | ICD-10-CM | POA: Diagnosis not present

## 2023-03-08 DIAGNOSIS — Z888 Allergy status to other drugs, medicaments and biological substances status: Secondary | ICD-10-CM | POA: Diagnosis not present

## 2023-03-08 DIAGNOSIS — E039 Hypothyroidism, unspecified: Secondary | ICD-10-CM | POA: Diagnosis present

## 2023-03-08 DIAGNOSIS — Z66 Do not resuscitate: Secondary | ICD-10-CM | POA: Diagnosis present

## 2023-03-08 DIAGNOSIS — D696 Thrombocytopenia, unspecified: Secondary | ICD-10-CM | POA: Diagnosis not present

## 2023-03-08 DIAGNOSIS — Z87442 Personal history of urinary calculi: Secondary | ICD-10-CM | POA: Diagnosis not present

## 2023-03-08 DIAGNOSIS — I10 Essential (primary) hypertension: Secondary | ICD-10-CM | POA: Diagnosis present

## 2023-03-08 DIAGNOSIS — Z9841 Cataract extraction status, right eye: Secondary | ICD-10-CM | POA: Diagnosis not present

## 2023-03-08 DIAGNOSIS — T473X6A Underdosing of saline and osmotic laxatives, initial encounter: Secondary | ICD-10-CM | POA: Diagnosis present

## 2023-03-08 DIAGNOSIS — K7682 Hepatic encephalopathy: Secondary | ICD-10-CM | POA: Diagnosis not present

## 2023-03-08 DIAGNOSIS — D638 Anemia in other chronic diseases classified elsewhere: Secondary | ICD-10-CM | POA: Diagnosis present

## 2023-03-08 DIAGNOSIS — K7581 Nonalcoholic steatohepatitis (NASH): Secondary | ICD-10-CM | POA: Diagnosis present

## 2023-03-08 DIAGNOSIS — Z6838 Body mass index (BMI) 38.0-38.9, adult: Secondary | ICD-10-CM | POA: Diagnosis not present

## 2023-03-08 DIAGNOSIS — Z961 Presence of intraocular lens: Secondary | ICD-10-CM | POA: Diagnosis present

## 2023-03-08 DIAGNOSIS — R5381 Other malaise: Secondary | ICD-10-CM | POA: Diagnosis present

## 2023-03-08 DIAGNOSIS — Y92009 Unspecified place in unspecified non-institutional (private) residence as the place of occurrence of the external cause: Secondary | ICD-10-CM | POA: Diagnosis not present

## 2023-03-08 DIAGNOSIS — Z8249 Family history of ischemic heart disease and other diseases of the circulatory system: Secondary | ICD-10-CM | POA: Diagnosis not present

## 2023-03-08 DIAGNOSIS — Z86718 Personal history of other venous thrombosis and embolism: Secondary | ICD-10-CM | POA: Diagnosis not present

## 2023-03-08 DIAGNOSIS — Z79890 Hormone replacement therapy (postmenopausal): Secondary | ICD-10-CM | POA: Diagnosis not present

## 2023-03-08 LAB — COMPREHENSIVE METABOLIC PANEL
ALT: 20 U/L (ref 0–44)
AST: 34 U/L (ref 15–41)
Albumin: 2.1 g/dL — ABNORMAL LOW (ref 3.5–5.0)
Alkaline Phosphatase: 75 U/L (ref 38–126)
Anion gap: 7 (ref 5–15)
BUN: 24 mg/dL — ABNORMAL HIGH (ref 8–23)
CO2: 17 mmol/L — ABNORMAL LOW (ref 22–32)
Calcium: 7.6 mg/dL — ABNORMAL LOW (ref 8.9–10.3)
Chloride: 114 mmol/L — ABNORMAL HIGH (ref 98–111)
Creatinine, Ser: 0.88 mg/dL (ref 0.44–1.00)
GFR, Estimated: >60 mL/min (ref >60)
Glucose, Bld: 77 mg/dL (ref 70–99)
Potassium: 3.7 mmol/L (ref 3.5–5.1)
Sodium: 138 mmol/L (ref 135–145)
Total Bilirubin: 1.3 mg/dL — ABNORMAL HIGH (ref 0.0–1.2)
Total Protein: 4.6 g/dL — ABNORMAL LOW (ref 6.5–8.1)

## 2023-03-08 LAB — CBC
HCT: 30.6 % — ABNORMAL LOW (ref 36.0–46.0)
Hemoglobin: 9.7 g/dL — ABNORMAL LOW (ref 12.0–15.0)
MCH: 29.8 pg (ref 26.0–34.0)
MCHC: 31.7 g/dL (ref 30.0–36.0)
MCV: 93.9 fL (ref 80.0–100.0)
Platelets: 140 10*3/uL — ABNORMAL LOW (ref 150–400)
RBC: 3.26 MIL/uL — ABNORMAL LOW (ref 3.87–5.11)
RDW: 17.5 % — ABNORMAL HIGH (ref 11.5–15.5)
WBC: 5.6 10*3/uL (ref 4.0–10.5)
nRBC: 0 % (ref 0.0–0.2)

## 2023-03-08 LAB — CREATININE, SERUM
Creatinine, Ser: 1.04 mg/dL — ABNORMAL HIGH (ref 0.44–1.00)
GFR, Estimated: 60 mL/min — ABNORMAL LOW (ref >60)

## 2023-03-08 LAB — AMMONIA: Ammonia: 54 umol/L — ABNORMAL HIGH (ref 9–35)

## 2023-03-08 MED ORDER — SPIRONOLACTONE 25 MG PO TABS
50.0000 mg | ORAL_TABLET | Freq: Every day | ORAL | Status: DC
  Administered 2023-03-08 – 2023-03-13 (×6): 50 mg via ORAL
  Filled 2023-03-08 (×6): qty 2

## 2023-03-08 MED ORDER — MIDODRINE HCL 5 MG PO TABS
10.0000 mg | ORAL_TABLET | Freq: Three times a day (TID) | ORAL | Status: DC
  Administered 2023-03-08 – 2023-03-13 (×15): 10 mg via ORAL
  Filled 2023-03-08 (×15): qty 2

## 2023-03-08 MED ORDER — ACETAMINOPHEN 500 MG PO TABS
500.0000 mg | ORAL_TABLET | Freq: Once | ORAL | Status: AC
  Administered 2023-03-08: 500 mg via ORAL
  Filled 2023-03-08: qty 1

## 2023-03-08 MED ORDER — FUROSEMIDE 40 MG PO TABS
40.0000 mg | ORAL_TABLET | Freq: Every day | ORAL | Status: DC
  Administered 2023-03-08 – 2023-03-13 (×6): 40 mg via ORAL
  Filled 2023-03-08 (×6): qty 1

## 2023-03-08 MED ORDER — ENOXAPARIN SODIUM 40 MG/0.4ML IJ SOSY
40.0000 mg | PREFILLED_SYRINGE | INTRAMUSCULAR | Status: DC
  Administered 2023-03-08 – 2023-03-13 (×6): 40 mg via SUBCUTANEOUS
  Filled 2023-03-08 (×6): qty 0.4

## 2023-03-08 MED ORDER — FOLIC ACID 1 MG PO TABS
1.0000 mg | ORAL_TABLET | Freq: Every day | ORAL | Status: DC
  Administered 2023-03-08 – 2023-03-13 (×6): 1 mg via ORAL
  Filled 2023-03-08 (×6): qty 1

## 2023-03-08 MED ORDER — PROCHLORPERAZINE EDISYLATE 10 MG/2ML IJ SOLN
5.0000 mg | Freq: Four times a day (QID) | INTRAMUSCULAR | Status: DC | PRN
  Administered 2023-03-10 – 2023-03-11 (×2): 5 mg via INTRAVENOUS
  Filled 2023-03-08 (×2): qty 2

## 2023-03-08 MED ORDER — LACTULOSE 10 GM/15ML PO SOLN
20.0000 g | Freq: Once | ORAL | Status: AC
  Administered 2023-03-08: 20 g via ORAL
  Filled 2023-03-08: qty 30

## 2023-03-08 MED ORDER — RIFAXIMIN 550 MG PO TABS
550.0000 mg | ORAL_TABLET | Freq: Two times a day (BID) | ORAL | Status: DC
  Administered 2023-03-08 – 2023-03-13 (×10): 550 mg via ORAL
  Filled 2023-03-08 (×10): qty 1

## 2023-03-08 MED ORDER — ALBUTEROL SULFATE (2.5 MG/3ML) 0.083% IN NEBU: 2.5000 mg | INHALATION_SOLUTION | Freq: Four times a day (QID) | RESPIRATORY_TRACT | Status: DC | PRN

## 2023-03-08 MED ORDER — LACTULOSE 10 GM/15ML PO SOLN
20.0000 g | Freq: Three times a day (TID) | ORAL | Status: DC
  Administered 2023-03-08 – 2023-03-13 (×15): 20 g via ORAL
  Filled 2023-03-08 (×15): qty 30

## 2023-03-08 MED ORDER — VENLAFAXINE HCL ER 75 MG PO CP24
75.0000 mg | ORAL_CAPSULE | Freq: Every day | ORAL | Status: DC
  Administered 2023-03-09 – 2023-03-13 (×5): 75 mg via ORAL
  Filled 2023-03-08 (×5): qty 1

## 2023-03-08 MED ORDER — LACTULOSE 10 GM/15ML PO SOLN: 20.0000 g | Freq: Two times a day (BID) | ORAL | Status: DC

## 2023-03-08 MED ORDER — SENNOSIDES-DOCUSATE SODIUM 8.6-50 MG PO TABS
1.0000 | ORAL_TABLET | Freq: Two times a day (BID) | ORAL | Status: DC
  Administered 2023-03-08 – 2023-03-12 (×7): 1 via ORAL
  Filled 2023-03-08 (×11): qty 1

## 2023-03-08 MED ORDER — POLYETHYLENE GLYCOL 3350 17 G PO PACK: 17.0000 g | PACK | Freq: Every day | ORAL | Status: DC | PRN

## 2023-03-08 MED ORDER — ATORVASTATIN CALCIUM 40 MG PO TABS
40.0000 mg | ORAL_TABLET | Freq: Every morning | ORAL | Status: DC
  Administered 2023-03-08 – 2023-03-13 (×6): 40 mg via ORAL
  Filled 2023-03-08 (×6): qty 1

## 2023-03-08 MED ORDER — SODIUM CHLORIDE 0.9 % IV SOLN
500.0000 mg | INTRAVENOUS | Status: DC
  Administered 2023-03-08: 500 mg via INTRAVENOUS
  Filled 2023-03-08: qty 5

## 2023-03-08 MED ORDER — LEVOTHYROXINE SODIUM 150 MCG PO TABS
150.0000 ug | ORAL_TABLET | Freq: Every day | ORAL | Status: DC
  Administered 2023-03-08 – 2023-03-13 (×6): 150 ug via ORAL
  Filled 2023-03-08 (×6): qty 1

## 2023-03-08 NOTE — Hospital Course (Addendum)
 68 year old woman PMH including cirrhosis presenting with confusion.  Admitted for acute hepatic encephalopathy.  Consultants None   Procedures/Events None

## 2023-03-08 NOTE — H&P (Addendum)
 History and Physical  [name] WGN:[id number] DOB: 1955/03/09 DOA: 03/07/2023  Referring physician: Dr. Eudelia Bunch, EDP  PCP: [name], MD  Outpatient Specialists: GI, nephrology, hematology/oncology, nephrology Patient coming from: Home.  Chief Complaint: Confusion.  HPI: [name] is a 68 y.o. female with medical history significant for decompensated cirrhosis, iron deficiency anemia, anemia of chronic disease, cirrhosis, presented to the ER due to confusion.  Per the patient's son she had some confusion regarding familiar places in the house also complains of associated abdominal pain.  No nausea vomiting or diarrhea reported.  The patient was brought in by her son to the ER.  In the ER, the patient had multiple stools after restarting lactulose.  Ammonia level 101.  [facility], hospitalist service, was asked to admit.  ED Course: Temperature 98.6.  BP 121/48, pulse 84, respiration rate 16, O2 saturation 100% on room air.  Lab studies notable for potassium 3.4, bicarb 20, BUN 28, creatinine 1.04, T. bili 1.6, GFR  Review of Systems: Review of systems as noted in the HPI. All other systems reviewed and are negative.   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   Obesity    Pulmonary emboli (HCC) 01/10/2009   Pulmonary embolism (HCC)    Short gut syndrome    Thyroid disease    Past Surgical History:  Procedure Laterality Date   ABDOMINAL WALL MESH  REMOVAL     2016   CESAREAN SECTION WITH BILATERAL TUBAL LIGATION   04/30/1985   CHOLECYSTECTOMY  05/2007   Exploratory laparotomy, lysis of adhesions, takedown of ileostomy with small bowel resection, open cholecystectomy and ileocolostomy   COLON SURGERY  01/2007   exploratory laparotomy with right colecotmy and end ileostomy   COLONOSCOPY  04/02/2019   Dr Clifton James Elie Confer, MD [facility]   ESOPHAGOGASTRODUODENOSCOPY (EGD) WITH PROPOFOL N/A 10/19/2022   Procedure: ESOPHAGOGASTRODUODENOSCOPY (EGD) WITH PROPOFOL;  Surgeon: Kerin Salen, MD;  Location: [facility];  Service: Gastroenterology;  Laterality: N/A;   ESOPHAGOSCOPY  04/02/2019   EYE SURGERY Left 10/06/2020   Cataract extraciton w/intraocular lens implant- Dr Valere Dross   HERNIA REPAIR  10/2007   ILEOSTOMY  2009   states bowel perfotation wih colonsocopy   ILEOSTOMY CLOSURE  2009   INCISIONAL HERNIA REPAIR  10/11/2018   IR KYPHO THORACIC WITH BONE BIOPSY  08/31/2022   IR KYPHO THORACIC WITH BONE BIOPSY  10/27/2022   IR RADIOLOGIST EVAL & MGMT  09/27/2022   KIDNEY STONE SURGERY     LIVER RESECTION     2009   PANNICULECTOMY     repair of abdominal wall hernia   WRIST SURGERY     tendonitis    Social History:  reports that she quit smoking about 38 years ago. Her smoking use included cigarettes. She has been exposed to tobacco smoke. She has never used smokeless tobacco. She reports that she does not drink alcohol and does not use drugs.   Allergies  Allergen Reactions   Ace  Inhibitors Anaphylaxis and Swelling   Ivp Dye [Iodinated Contrast Media] Anaphylaxis   Desitin [Zinc Oxide] Rash and Other (See Comments)    Worsens rash   Gadolinium Derivatives Other (See Comments)    Reaction??   Hydroxyzine Other (See Comments)    Affects the mind   Chlorhexidine Rash and Other (See Comments)    WORSENS RASHES   Doxycycline Rash   Penicillins Rash   Zofran [Ondansetron] Rash    Family History  Problem Relation Age of Onset   Uterine cancer Mother    Bone cancer Father    Heart disease Father     Hyperlipidemia Father    Dementia Father    Hypertension Father    Skin cancer Sister    Hypertension Sister    Diabetes Sister    Hyperlipidemia Brother    Hypertension Brother    Heart disease Brother       Prior to Admission medications   Medication Sig Start Date End Date Taking? Authorizing Provider  albuterol (VENTOLIN HFA) 108 (90 Base) MCG/ACT inhaler Inhale 2 puffs into the lungs every 6 (six) hours as needed for shortness of breath or wheezing. 02/13/23   [provider]  atorvastatin (LIPITOR) 40 MG tablet Take 1 tablet (40 mg total) by mouth in the morning. 02/22/23   [name], MD  azithromycin (ZITHROMAX Z-PAK) 250 MG tablet Take 1 tablet (250 mg total) by mouth daily for 5 days. 03/05/23 03/10/23  Sponseller, Lupe Carney R, PA-C  cyanocobalamin (VITAMIN B12) 1000 MCG tablet Take 2,000 mcg by mouth every Friday.    [provider]  dicyclomine (BENTYL) 10 MG capsule Take 1 capsule (10 mg total) by mouth 3 (three) times daily as needed for spasms (abdominal cramping). 09/09/22   Lewie Chamber, MD  famotidine (PEPCID) 20 MG tablet Take 20 mg by mouth in the morning.    [provider]  folic acid (FOLVITE) 1 MG tablet Take 1 tablet (1 mg total) by mouth daily. 06/16/22   Calvert Cantor, MD  furosemide (LASIX) 40 MG tablet Take 1 tablet (40 mg total) by mouth daily. Take additional 40 mg if there is a weight gain of 3 lbs in1 day or 5 lbs in 1 week. 12/17/22   Marinda Elk, MD  HYDROcodone-acetaminophen (NORCO) 10-325 MG tablet Take 1 tablet by mouth every 8 (eight) hours as needed for moderate pain (pain score 4-6). 12/19/22   [provider]  IRON-VITAMIN C PO Take 1 tablet by mouth daily with breakfast.    [provider]  lactulose (CHRONULAC) 10 GM/15ML solution Take 30 mLs (20 g total) by mouth 3 (three) times daily. Take to ensure that there is 2 loose bowel movement EVERY DAY Patient taking differently: Take 30 mLs by mouth  3 (three) times daily as needed (to ensure there are 2 loose bowel movements EVERY DAY). 10/31/22   Rolly Salter, MD  lactulose, encephalopathy, (CHRONULAC) 10 GM/15ML SOLN Take 20 g by mouth 2 (two) times daily. 02/21/23   [provider]  levothyroxine (SYNTHROID) 150 MCG tablet Take 150 mcg by mouth daily before breakfast. 10/26/21   [provider]  lidocaine (LIDODERM) 5 % Place 1 patch onto the skin daily. Remove & Discard patch within 12 hours or as directed by MD Patient taking differently: Place 1 patch onto the skin daily as needed (for pain- Remove & Discard patch within 12 hours or as directed by MD). 10/31/22   Rolly Salter, MD  megestrol (  MEGACE) 40 MG tablet Take 1 tablet (40 mg total) by mouth 2 (two) times daily. 01/13/23   Standley Brooking, MD  methocarbamol (ROBAXIN) 500 MG tablet Take 500 mg by mouth in the morning. 03/04/22   [provider]  midodrine (PROAMATINE) 10 MG tablet Take 1 tablet (10 mg total) by mouth 3 (three) times daily with meals. 09/09/22   Lewie Chamber, MD  naloxone [facility]) nasal spray 4 mg/0.1 mL Place 1 spray into the nose as needed (accidental overdose). 03/25/22   [provider]  nystatin cream (MYCOSTATIN) Apply topically 2 (two) times daily. 01/13/23   Standley Brooking, MD  nystatin powder Apply 1 Application topically 3 (three) times daily as needed (for irritation- affected areas).    [provider]  pantoprazole (PROTONIX) 40 MG tablet Take 1 tablet (40 mg total) by mouth 2 (two) times daily before a meal. 09/09/22   Lewie Chamber, MD  potassium chloride (KLOR-CON) 10 MEQ tablet Take 10 mEq by mouth daily. 02/22/23   [provider]  promethazine (PHENERGAN) 12.5 MG tablet Take 12.5 mg by mouth every 6 (six) hours as needed for nausea or vomiting.    [provider]  rifaximin (XIFAXAN) 550 MG TABS tablet Take 550 mg by mouth 2 (two) times daily.    [provider]  rOPINIRole  (REQUIP) 0.25 MG tablet Take 1 tablet (0.25 mg total) by mouth at bedtime. Patient taking differently: Take 0.25 mg by mouth daily. 09/09/22   Lewie Chamber, MD  spironolactone (ALDACTONE) 50 MG tablet Take 1 tablet (50 mg total) by mouth daily. 02/09/23   Amin, Ankit C, MD  tamsulosin (FLOMAX) 0.4 MG CAPS capsule Take 1 capsule (0.4 mg total) by mouth daily. 09/10/22   Lewie Chamber, MD  triamcinolone cream (KENALOG) 0.1 % Apply BID to affected areas. 03/06/23   [name], MD  triamcinolone cream (KENALOG) 0.5 % Apply to affected area of right breast twice daily as directed. Patient taking differently: Apply 1 Application topically 2 (two) times daily as needed (for irritation- right breast). 05/10/22   Osvaldo Shipper, MD  venlafaxine XR (EFFEXOR-XR) 75 MG 24 hr capsule Take 75 mg by mouth daily with breakfast. 08/17/21   [provider]  Vitamin D, Ergocalciferol, (DRISDOL) 50000 UNITS CAPS Take 50,000 Units by mouth every Wednesday.    [provider]    Physical Exam: BP 117/60 (BP Location: Left Arm)   Pulse 80   Temp 98.3 F (36.8 C) (Oral)   Resp 16   SpO2 100%   General: 68 y.o. year-old female well developed well nourished in no acute distress.  Alert and oriented x2. Cardiovascular: Regular rate and rhythm with no rubs or gallops.  No thyromegaly or JVD noted.  No lower extremity edema. 2/4 pulses in all 4 extremities. Respiratory: Clear to auscultation with no wheezes or rales. Good inspiratory effort. Abdomen: Soft nontender nondistended with normal bowel sounds x4 quadrants. Muskuloskeletal: No cyanosis, clubbing or edema noted bilaterally Neuro: CN II-XII intact, strength, sensation, reflexes Skin: No ulcerative lesions noted or rashes Psychiatry: Judgement and insight appear altered. Mood is appropriate for condition and setting          Labs on Admission:  Basic Metabolic Panel: Recent Labs  Lab 03/04/23 2323 03/07/23 1645  NA 135 138  K 4.3  3.4*  CL 110 111  CO2 20* 20*  GLUCOSE 138* 113*  BUN 22 28*  CREATININE 1.06* 1.12*  CALCIUM 8.7* 9.1  MG 1.9  --  Liver Function Tests: Recent Labs  Lab 03/04/23 2323 03/07/23 1645  AST 27 38  ALT 20 25  ALKPHOS 88 112  BILITOT 1.6* 1.6*  PROT 6.1* 6.9  ALBUMIN 2.7* 3.1*   Recent Labs  Lab 03/04/23 2323  LIPASE 56*   Recent Labs  Lab 03/04/23 2355 03/07/23 1645  AMMONIA 50* 104*   CBC: Recent Labs  Lab 03/04/23 2323 03/07/23 1645  WBC 6.7 5.8  NEUTROABS 4.8 3.1  HGB 9.9* 15.7*  HCT 32.8* 52.7*  MCV 95.3 96.3  PLT 159 169   Cardiac Enzymes: No results for input(s): "CKTOTAL", "CKMB", "CKMBINDEX", "TROPONINI" in the last 168 hours.  BNP (last 3 results) Recent Labs    08/30/22 0354 01/03/23 0616  BNP 614.9* 469.4*    ProBNP (last 3 results) No results for input(s): "PROBNP" in the last 8760 hours.  CBG: Recent Labs  Lab 03/04/23 2310 03/07/23 1558  GLUCAP 113* 108*    Radiological Exams on Admission: DG Chest Portable 1 View Result Date: 03/07/2023 CLINICAL DATA:  Altered mental status. EXAM: PORTABLE CHEST 1 VIEW COMPARISON:  X-ray 03/04/2023 FINDINGS: Under penetrated radiograph with underinflation. Stable cardiopericardial silhouette with calcified tortuous aorta. No consolidation, pneumothorax or effusion. No edema. Overlapping gown snaps. IMPRESSION: Limited radiograph.  Grossly no acute cardiopulmonary disease. Electronically Signed   By: Karen Kays M.D.   On: 03/07/2023 17:36    EKG: I independently viewed the EKG done and my findings are as followed: Sinus rhythm rate of 85.  Nonspecific ST-T changes with QTc 468.  Assessment/Plan Present on Admission:  Hepatic encephalopathy (HCC)  Principal Problem:   Hepatic encephalopathy (HCC)  Hepatic encephalopathy, unclear if compliant with home lactulose Multiple bowel movements in the ER after resuminng home lactulose Presenting ammonia 104.  Repeat ammonia level x 1 Goal 2-3  loose bowel movements per day Reorient as needed Fall and aspiration precautions Avoid hepatotoxic agents.  Decompensated cirrhosis, POA Avoid hepatotoxic agents Low salt diet  Isolated elevated T bilirubin T. bili 1.6 Monitor for now Avoid hepatotoxic agents.  Obesity BMI 38 Recommend weight loss outpatient with regular physical activity and healthy dieting.  Hypothyroidism Resume home levothyroxine  Physical debility PT OT assessment Fall precautions.   Time: 75 minutes.   DVT prophylaxis: Subcu Lovenox daily  Code Status: DNR  Family Communication: None at bedside.  Disposition Plan: Admitted to MedSurg unit.  Consults called: None.  Admission status: Inpatient status.   Status is: Inpatient The patient requires at least 2 midnights for further evaluation and treatment of present condition.   Darlin Drop MD [facility] Pager [phone]  If 7PM-7AM, please contact night-coverage [email or web address] Password [facility]  03/08/2023, 2:53 AM

## 2023-03-08 NOTE — ED Provider Notes (Signed)
 [place] EMERGENCY DEPARTMENT AT [facility] Provider Note  CSN: [id number] Arrival date & time: 03/07/23 1541  Chief Complaint(s) Altered Mental Status  HPI [name] is a 68 y.o. female with a past medical history listed below including cirrhosis and prior admissions for hepatic encephalopathy on lactulose who presents to the emergency department with altered mental status and slowness.  Patient was recently seen in the emergency department for the same.  Ammonia level at that time was around 50.  She is accompanied by her son who reports that the patient has had difficulty sleeping.  She overslept on Sunday and did not take the morning lactulose.  She did take it later on in the afternoon.  Today she had not had a bowel movement in the morning and did not take the lactulose this morning since they were out and about.  While in the waiting room, she did have 2 large bowel movements.  Still a bit confused.  Son did report that during the previous encounter, they had prescribed her azithromycin for possible pneumonia which the pharmacy had not been able to fill until this morning.   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   Obesity    Pulmonary emboli (HCC) 01/10/2009   Pulmonary embolism (HCC)    Short gut syndrome    Thyroid disease    Patient Active Problem List   Diagnosis Date Noted   Pruritus 03/06/2023   Edema of both lower legs 03/06/2023   Insomnia  03/06/2023   Compression fracture of body of thoracic vertebra (HCC) 03/06/2023   Anemia of chronic disease 02/22/2023   Hepatic encephalopathy (HCC) 12/10/2022   Lumbar radiculopathy 12/10/2022   Grade I diastolic dysfunction 12/10/2022   History of DVT (deep vein thrombosis) 12/01/2022   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   Acute hepatic encephalopathy (HCC) 11/30/2022   DNR (do not resuscitate)/DNI(Do Not intubate) 10/05/2022   Vaginal bleeding 10/05/2022   Physical deconditioning 08/17/2022   Decompensated cirrhosis (HCC) 08/17/2022   Hypotension 08/11/2022   Folate deficiency 06/14/2022   Systolic murmur 06/12/2022   Iron deficiency anemia 05/16/2022   Thrombocytopenia (HCC) 05/16/2022   History of eye surgery 05/01/2022   Corneal abnormality 04/29/2022   Status post eye surgery 04/29/2022   Corneal perforation of left eye 04/18/2022   Pseudophakia of left eye 02/08/2022   Mastitis 12/15/2021   Physical debility 12/10/2021   Palliative care encounter 12/01/2021   Postmenopausal 11/24/2021   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   Polypharmacy 06/22/2021   Hyperammonemia (HCC) 06/17/2021   Former smoker 01/06/2021   Combined form of age-related cataract, right eye 10/01/2020   Corneal scarring 09/10/2020   Cerebral atherosclerosis 08/20/2020   Short gut syndrome 08/20/2020   DDD (degenerative disc disease), lumbar 08/20/2020   Abdominal wall hernia 03/06/2020   Splenomegaly  02/06/2019   Vitamin B12 deficiency 01/21/2019   Depression, major, recurrent, moderate (HCC) 09/10/2018   Hyperuricemia 08/25/2016   GERD (gastroesophageal reflux disease) 07/25/2016   Hypothyroidism 07/25/2016   Microcytic anemia 04/12/2016   Vitamin D deficiency 05/04/2015   History of adenomatous polyp of colon 04/28/2015   H/O angioedema 11/15/2014   Chronic diarrhea 06/01/2014   Hypokalemia  06/01/2014   Home Medication(s) Prior to Admission medications   Medication Sig Start Date End Date Taking? Authorizing Provider  albuterol (VENTOLIN HFA) 108 (90 Base) MCG/ACT inhaler Inhale 2 puffs into the lungs every 6 (six) hours as needed for shortness of breath or wheezing. 02/13/23   [provider]  atorvastatin (LIPITOR) 40 MG tablet Take 1 tablet (40 mg total) by mouth in the morning. 02/22/23   [name], MD  azithromycin (ZITHROMAX Z-PAK) 250 MG tablet Take 1 tablet (250 mg total) by mouth daily for 5 days. 03/05/23 03/10/23  Sponseller, Lupe Carney R, PA-C  cyanocobalamin (VITAMIN B12) 1000 MCG tablet Take 2,000 mcg by mouth every Friday.    [provider]  dicyclomine (BENTYL) 10 MG capsule Take 1 capsule (10 mg total) by mouth 3 (three) times daily as needed for spasms (abdominal cramping). 09/09/22   Lewie Chamber, MD  famotidine (PEPCID) 20 MG tablet Take 20 mg by mouth in the morning.    [provider]  folic acid (FOLVITE) 1 MG tablet Take 1 tablet (1 mg total) by mouth daily. 06/16/22   Calvert Cantor, MD  furosemide (LASIX) 40 MG tablet Take 1 tablet (40 mg total) by mouth daily. Take additional 40 mg if there is a weight gain of 3 lbs in1 day or 5 lbs in 1 week. 12/17/22   Marinda Elk, MD  HYDROcodone-acetaminophen (NORCO) 10-325 MG tablet Take 1 tablet by mouth every 8 (eight) hours as needed for moderate pain (pain score 4-6). 12/19/22   [provider]  IRON-VITAMIN C PO Take 1 tablet by mouth daily with breakfast.    [provider]  lactulose (CHRONULAC) 10 GM/15ML solution Take 30 mLs (20 g total) by mouth 3 (three) times daily. Take to ensure that there is 2 loose bowel movement EVERY DAY Patient taking differently: Take 30 mLs by mouth 3 (three) times daily as needed (to ensure there are 2 loose bowel movements EVERY DAY). 10/31/22   Rolly Salter, MD  lactulose, encephalopathy, (CHRONULAC) 10 GM/15ML SOLN Take 20 g by  mouth 2 (two) times daily. 02/21/23   [provider]  levothyroxine (SYNTHROID) 150 MCG tablet Take 150 mcg by mouth daily before breakfast. 10/26/21   [provider]  lidocaine (LIDODERM) 5 % Place 1 patch onto the skin daily. Remove & Discard patch within 12 hours or as directed by MD Patient taking differently: Place 1 patch onto the skin daily as needed (for pain- Remove & Discard patch within 12 hours or as directed by MD). 10/31/22   Rolly Salter, MD  megestrol (MEGACE) 40 MG tablet Take 1 tablet (40 mg total) by mouth 2 (two) times daily. 01/13/23   Standley Brooking, MD  methocarbamol (ROBAXIN) 500 MG tablet Take 500 mg by mouth in the morning. 03/04/22   [provider]  midodrine (PROAMATINE) 10 MG tablet Take 1 tablet (10 mg total) by mouth 3 (three) times daily with meals. 09/09/22   Lewie Chamber, MD  naloxone [facility]) nasal spray 4 mg/0.1 mL Place 1 spray into the nose as needed (accidental overdose). 03/25/22  [provider]  nystatin cream (MYCOSTATIN) Apply topically 2 (two) times daily. 01/13/23   Standley Brooking, MD  nystatin powder Apply 1 Application topically 3 (three) times daily as needed (for irritation- affected areas).    [provider]  pantoprazole (PROTONIX) 40 MG tablet Take 1 tablet (40 mg total) by mouth 2 (two) times daily before a meal. 09/09/22   Lewie Chamber, MD  potassium chloride (KLOR-CON) 10 MEQ tablet Take 10 mEq by mouth daily. 02/22/23   [provider]  promethazine (PHENERGAN) 12.5 MG tablet Take 12.5 mg by mouth every 6 (six) hours as needed for nausea or vomiting.    [provider]  rifaximin (XIFAXAN) 550 MG TABS tablet Take 550 mg by mouth 2 (two) times daily.    [provider]  rOPINIRole (REQUIP) 0.25 MG tablet Take 1 tablet (0.25 mg total) by mouth at bedtime. Patient taking differently: Take 0.25 mg by mouth daily. 09/09/22   Lewie Chamber, MD  spironolactone  (ALDACTONE) 50 MG tablet Take 1 tablet (50 mg total) by mouth daily. 02/09/23   Amin, Ankit C, MD  tamsulosin (FLOMAX) 0.4 MG CAPS capsule Take 1 capsule (0.4 mg total) by mouth daily. 09/10/22   Lewie Chamber, MD  triamcinolone cream (KENALOG) 0.1 % Apply BID to affected areas. 03/06/23   [name], MD  triamcinolone cream (KENALOG) 0.5 % Apply to affected area of right breast twice daily as directed. Patient taking differently: Apply 1 Application topically 2 (two) times daily as needed (for irritation- right breast). 05/10/22   Osvaldo Shipper, MD  venlafaxine XR (EFFEXOR-XR) 75 MG 24 hr capsule Take 75 mg by mouth daily with breakfast. 08/17/21   [provider]  Vitamin D, Ergocalciferol, (DRISDOL) 50000 UNITS CAPS Take 50,000 Units by mouth every Wednesday.    [provider]                                                                                                                                    Allergies Ace inhibitors, Ivp dye [iodinated contrast media], Desitin [zinc oxide], Gadolinium derivatives, Hydroxyzine, Chlorhexidine, Doxycycline, Penicillins, and Zofran [ondansetron]  Review of Systems Review of Systems As noted in HPI  Physical Exam Vital Signs  I have reviewed the triage vital signs BP (!) 102/47   Pulse 80   Temp 98.4 F (36.9 C) (Oral)   Resp 16   SpO2 100%   Physical Exam Vitals reviewed.  Constitutional:      General: She is not in acute distress.    Appearance: She is well-developed. She is not diaphoretic.  HENT:     Head: Normocephalic and atraumatic.     Right Ear: External ear normal.     Left Ear: External ear normal.     Nose: Nose normal.  Eyes:     General: No scleral icterus.    Conjunctiva/sclera: Conjunctivae normal.  Neck:  Trachea: Phonation normal.  Cardiovascular:     Rate and Rhythm: Normal rate and regular rhythm.  Pulmonary:     Effort: Pulmonary effort is normal. No respiratory distress.     Breath  sounds: No stridor.  Abdominal:     General: There is no distension.  Musculoskeletal:        General: Normal range of motion.     Cervical back: Normal range of motion.  Neurological:     Mental Status: She is alert and oriented to person, place, and time.     Comments: Slowed to respond to questions  Psychiatric:        Behavior: Behavior normal.     ED Results and Treatments Labs (all labs ordered are listed, but only abnormal results are displayed) Labs Reviewed  CBC WITH DIFFERENTIAL/PLATELET - Abnormal; Notable for the following components:      Result Value   RBC 5.47 (*)    Hemoglobin 15.7 (*)    HCT 52.7 (*)    MCHC 29.8 (*)    RDW 18.2 (*)    All other components within normal limits  COMPREHENSIVE METABOLIC PANEL - Abnormal; Notable for the following components:   Potassium 3.4 (*)    CO2 20 (*)    Glucose, Bld 113 (*)    BUN 28 (*)    Creatinine, Ser 1.12 (*)    Albumin 3.1 (*)    Total Bilirubin 1.6 (*)    GFR, Estimated 55 (*)    All other components within normal limits  AMMONIA - Abnormal; Notable for the following components:   Ammonia 104 (*)    All other components within normal limits  PROTIME-INR - Abnormal; Notable for the following components:   Prothrombin Time 18.2 (*)    INR 1.5 (*)    All other components within normal limits  APTT - Abnormal; Notable for the following components:   aPTT 38 (*)    All other components within normal limits  CREATININE, SERUM - Abnormal; Notable for the following components:   Creatinine, Ser 1.04 (*)    GFR, Estimated 60 (*)    All other components within normal limits  CBC - Abnormal; Notable for the following components:   RBC 3.26 (*)    Hemoglobin 9.7 (*)    HCT 30.6 (*)    RDW 17.5 (*)    Platelets 140 (*)    All other components within normal limits  AMMONIA - Abnormal; Notable for the following components:   Ammonia 54 (*)    All other components within normal limits  COMPREHENSIVE METABOLIC  PANEL - Abnormal; Notable for the following components:   Chloride 114 (*)    CO2 17 (*)    BUN 24 (*)    Calcium 7.6 (*)    Total Protein 4.6 (*)    Albumin 2.1 (*)    Total Bilirubin 1.3 (*)    All other components within normal limits  CBG MONITORING, ED - Abnormal; Notable for the following components:   Glucose-Capillary 108 (*)    All other components within normal limits  URINALYSIS, ROUTINE W REFLEX MICROSCOPIC  EKG  EKG Interpretation Date/Time:  Tuesday March 07 2023 16:02:53 EST Ventricular Rate:  85 PR Interval:  172 QRS Duration:  75 QT Interval:  393 QTC Calculation: 468 R Axis:   11  Text Interpretation: Sinus rhythm LVH by voltage Anterior Q waves, possibly due to LVH No significant change since last tracing Confirmed by Drema Pry [phone]) on 03/07/2023 11:05:43 PM       Radiology DG Chest Portable 1 View Result Date: 03/07/2023 CLINICAL DATA:  Altered mental status. EXAM: PORTABLE CHEST 1 VIEW COMPARISON:  X-ray 03/04/2023 FINDINGS: Under penetrated radiograph with underinflation. Stable cardiopericardial silhouette with calcified tortuous aorta. No consolidation, pneumothorax or effusion. No edema. Overlapping gown snaps. IMPRESSION: Limited radiograph.  Grossly no acute cardiopulmonary disease. Electronically Signed   By: Karen Kays M.D.   On: 03/07/2023 17:36    Medications Ordered in ED Medications  azithromycin (ZITHROMAX) 500 mg in sodium chloride 0.9 % 250 mL IVPB (500 mg Intravenous New Bag/Given 03/08/23 0413)  senna-docusate (Senokot-S) tablet 1 tablet (1 tablet Oral Patient Refused/Not Given 03/08/23 0413)  enoxaparin (LOVENOX) injection 40 mg (has no administration in time range)  prochlorperazine (COMPAZINE) injection 5 mg (has no administration in time range)  polyethylene glycol (MIRALAX / GLYCOLAX) packet 17 g (has no  administration in time range)  atorvastatin (LIPITOR) tablet 40 mg (has no administration in time range)  folic acid (FOLVITE) tablet 1 mg (has no administration in time range)  lactulose (CHRONULAC) 10 GM/15ML solution 20 g (has no administration in time range)  levothyroxine (SYNTHROID) tablet 150 mcg (150 mcg Oral Given 03/08/23 0620)  spironolactone (ALDACTONE) tablet 50 mg (has no administration in time range)  lactulose (CHRONULAC) 10 GM/15ML solution 20 g (20 g Oral Given 03/08/23 0146)   Procedures Procedures  (including critical care time) Medical Decision Making / ED Course   Medical Decision Making Amount and/or Complexity of Data Reviewed Labs: ordered. Decision-making details documented in ED Course. Radiology: ordered and independent interpretation performed. Decision-making details documented in ED Course. ECG/medicine tests: ordered and independent interpretation performed. Decision-making details documented in ED Course.  Risk Prescription drug management. Decision regarding hospitalization.    Altered mental status differential diagnosis  Workup is consistent with hepatic encephalopathy with ammonia level greater than 100.    Metabolic panel without significant electrolyte derangements or renal sufficiency.  Mildly elevated bilirubin.  No leukocytosis.  No urinary tract infection.  Admitted to medicine for further workup and management.    Final Clinical Impression(s) / ED Diagnoses Final diagnoses:  Hepatic encephalopathy (HCC)    This chart was dictated using voice recognition software.  Despite best efforts to proofread,  errors can occur which can change the documentation meaning.    Nira Conn, MD 03/08/23 [phone]

## 2023-03-08 NOTE — Evaluation (Signed)
 Occupational Therapy Evaluation Patient Details Name: [name] MRN: [id number] DOB: [birth year] Today's Date: 03/08/2023   History of Present Illness   [name] is a 68 y.o. female admitted 03/07/23 with confusion, abdominal pain. Dx with Hepatic encephalopathy. PMH includes decompensated cirrhosis, iron deficiency anemia, anemia of chronic disease, cirrhosis, blind in L eye.     Clinical Impressions Pt lives with son and gets min assist at baseline. She uses a WC for community mobility (they were just approved for a power chair by insurance) and sponge bathes - she has been working with [facility] on increasing independence in these areas. Today she is overall GCA for transfers with RW, max A for LB dressing, min A for UB dressing, able to ambulate in the hallway with CGA. SPO2 at 100% throughout session, and son present and supportive. Cognition not back at baseline demonstrating deficits in awareness, memory, and problem solving - but son reports improvement since arriving and getting medical care. OT will follow acutely with plans for [facility] post-acute to maximize safety and independence in ADL and functional transfers.      If plan is discharge home, recommend the following:   A little help with walking and/or transfers;A lot of help with bathing/dressing/bathroom;Assistance with cooking/housework;Assist for transportation;Help with stairs or ramp for entrance     Functional Status Assessment   Patient has had a recent decline in their functional status and demonstrates the ability to make significant improvements in function in a reasonable and predictable amount of time.     Equipment Recommendations   None recommended by OT (PT has appropriate DME)     Recommendations for Other Services   PT consult     Precautions/Restrictions   Precautions Precautions: Fall Restrictions Weight Bearing Restrictions Per Provider Order: No     Mobility Bed Mobility Overal bed  mobility: Needs Assistance Bed Mobility: Supine to Sit, Sit to Supine (ED stretcher)     Supine to sit: Min assist Sit to supine: Mod assist (BLE, repositioning)   General bed mobility comments: ED stretcher    Transfers Overall transfer level: Needs assistance Equipment used: Rolling walker (2 wheels) Transfers: Sit to/from Stand Sit to Stand: Contact guard assist, From elevated surface (ED stretcher)                  Balance Overall balance assessment: Needs assistance Sitting-balance support: No upper extremity supported, Feet supported Sitting balance-Leahy Scale: Fair     Standing balance support: Bilateral upper extremity supported, During functional activity, Reliant on assistive device for balance Standing balance-Leahy Scale: Poor Standing balance comment: dynamic balance dependent on RW                           ADL either performed or assessed with clinical judgement   ADL Overall ADL's : Needs assistance/impaired Eating/Feeding: Set up;Sitting   Grooming: Set up;Standing Grooming Details (indicate cue type and reason): decreased activity tolerance Upper Body Bathing: Minimal assistance;Sitting   Lower Body Bathing: Minimal assistance;Sit to/from stand   Upper Body Dressing : Set up;Sitting Upper Body Dressing Details (indicate cue type and reason): new gown Lower Body Dressing: Maximal assistance;Bed level Lower Body Dressing Details (indicate cue type and reason): socks Toilet Transfer: Contact guard assist;Ambulation;Rolling walker (2 wheels) Toilet Transfer Details (indicate cue type and reason): simulated through hallway mobility and transfers on/off ED stretcher Toileting- Clothing Manipulation and Hygiene: Moderate assistance;Sit to/from stand       Functional mobility  during ADLs: Contact guard assist;Rolling walker (2 wheels) General ADL Comments: decreased activity tolerance, balance, cognition     Vision Baseline  Vision/History: 2 Legally blind (L eye) Ability to See in Adequate Light: 0 Adequate Patient Visual Report: No change from baseline       Perception         Praxis         Pertinent Vitals/Pain Pain Assessment Pain Assessment: 0-10 Pain Score: 8  Pain Location: abdomen and back Pain Descriptors / Indicators: Discomfort, Sore Pain Intervention(s): Limited activity within patient's tolerance, Monitored during session, Repositioned     Extremity/Trunk Assessment Upper Extremity Assessment Upper Extremity Assessment: Generalized weakness   Lower Extremity Assessment Lower Extremity Assessment: Defer to PT evaluation   Cervical / Trunk Assessment Cervical / Trunk Assessment: Kyphotic;Other exceptions Cervical / Trunk Exceptions: history of compression fx per son   Communication Communication Communication: No apparent difficulties   Cognition Arousal: Alert Behavior During Therapy: WFL for tasks assessed/performed Cognition: Cognition impaired     Awareness: Intellectual awareness intact, Online awareness impaired Memory impairment (select all impairments): Declarative long-term memory, Non-declarative long-term memory Attention impairment (select first level of impairment): Sustained attention Executive functioning impairment (select all impairments): Reasoning, Problem solving OT - Cognition Comments: Pt very pleasant and motivated to work with therapy. States that she feels better than when she came but that she is not her "normal" self yet. Son in agreement                 Following commands: Intact       Cueing  General Comments   Cueing Techniques: Verbal cues;Gestural cues      Exercises     Shoulder Instructions      Home Living Family/patient expects to be discharged to:: Private residence Living Arrangements: Children Available Help at Discharge: Family;Available 24 hours/day Type of Home: House Home Access: Ramped entrance     Home  Layout: One level     Bathroom Shower/Tub: Walk-in shower;Sponge bathes at baseline   [employer]: Standard     Home Equipment: Rollator (4 wheels);Rolling Walker (2 wheels);BSC/3in1;Other (comment);Hospital bed;Wheelchair - Engineer, technical sales - power   Additional Comments: Pt lives with [name] - very supportive, provides assist as needed      Prior Functioning/Environment Prior Level of Function : Needs assist             Mobility Comments: son typically present for all mobility; uses RW in home, manual WC outside home - he just got insurance approval for power chair ADLs Comments: Son has been assisting with ADLs lately. Pt states she was doing her own ADL tasks until recently    OT Problem List: Decreased activity tolerance;Impaired balance (sitting and/or standing);Decreased cognition;Decreased safety awareness;Obesity   OT Treatment/Interventions: Self-care/ADL training;Energy conservation;DME and/or AE instruction;Therapeutic activities;Patient/family education;Balance training      OT Goals(Current goals can be found in the care plan section)   Acute Rehab OT Goals Patient Stated Goal: get home to her dogs and cats OT Goal Formulation: With patient/family Time For Goal Achievement: 03/22/23 Potential to Achieve Goals: Good ADL Goals Pt Will Perform Grooming: with modified independence;standing Pt Will Transfer to Toilet: with modified independence;ambulating Pt Will Perform Toileting - Clothing Manipulation and hygiene: sit to/from stand;with min assist;with caregiver independent in assisting Additional ADL Goal #1: Pt will verbalize at least 3 ways to conserve energy during ADL with no cues   OT Frequency:  Min 1X/week    Co-evaluation  AM-PAC OT "6 Clicks" Daily Activity     Outcome Measure Help from another person eating meals?: None Help from another person taking care of personal grooming?: A Little Help from another person  toileting, which includes using toliet, bedpan, or urinal?: A Little Help from another person bathing (including washing, rinsing, drying)?: A Lot Help from another person to put on and taking off regular upper body clothing?: A Little Help from another person to put on and taking off regular lower body clothing?: A Lot 6 Click Score: 17   End of Session Equipment Utilized During Treatment: Gait belt;Rolling walker (2 wheels) Nurse Communication: Mobility status  Activity Tolerance: Patient tolerated treatment well Patient left: in bed;with call bell/phone within reach;with family/visitor present (ED stretcher)  OT Visit Diagnosis: Unsteadiness on feet (R26.81);Muscle weakness (generalized) (M62.81)                Time: 9811-9147 OT Time Calculation (min): 25 min Charges:  OT General Charges $OT Visit: 1 Visit OT Evaluation $OT Eval Low Complexity: 1 Low OT Treatments $Self Care/Home Management : 8-22 mins  Nyoka Cowden OTR/L Acute Rehabilitation Services Office: [phone]  Evern Bio [facility] 03/08/2023, 1:57 PM

## 2023-03-08 NOTE — Progress Notes (Signed)
  Progress Note   Patient: [name] ZOX:[id number] DOB: [birth year] DOA: 03/07/2023     0 DOS: the patient was seen and examined on 03/08/2023   Brief hospital course: 68 year old woman PMH including cirrhosis presenting with confusion.  Admitted for acute hepatic encephalopathy.  Consultants None   Procedures/Events None   Assessment and Plan: Acute hepatic encephalopathy, unclear if compliant with home lactulose Still symptomatic, ammonia still high but improving.  Continue lactulose.  Check ammonia level in AM. Resume Xifaxan   Decompensated cirrhosis, POA Mild thrombocytopenia Mild hyperbilirubinemia Treatment as above    Morbid obesity Recommend weight loss outpatient with regular physical activity and healthy dieting.   Hypothyroidism Resume home levothyroxine   Physical debility PT OT assessment Fall precautions.      Subjective:  Feels tired but ok Breathing ok Chronic pain in back and abdomen  Physical Exam: Vitals:   03/08/23 0600 03/08/23 0900 03/08/23 0945 03/08/23 1100  BP: (!) 102/47 (!) 133/55  104/65  Pulse: 80 73  80  Resp: 16 18  16   Temp: 98.4 F (36.9 C)  98.4 F (36.9 C)   TempSrc: Oral     SpO2: 100% 99%  99%   Physical Exam Vitals reviewed.  Constitutional:      General: She is not in acute distress.    Appearance: She is ill-appearing (chronically). She is not toxic-appearing.  Cardiovascular:     Rate and Rhythm: Normal rate and regular rhythm.     Heart sounds: Murmur (3/6 holosystolic) heard.  Pulmonary:     Effort: Pulmonary effort is normal. No respiratory distress.     Breath sounds: No wheezing, rhonchi or rales.  Abdominal:     Palpations: Abdomen is soft.     Hernia: A hernia (Ventral noted, reducible) is present.  Musculoskeletal:     Right lower leg: No edema.     Left lower leg: No edema.     Comments: Follows simple commands, moves all extremities  Neurological:     Mental Status: She is alert. She is  disoriented.  Psychiatric:        Mood and Affect: Mood normal.        Behavior: Behavior normal.     Data Reviewed: CMP noted Ammonia 104 > 54 Hgb 9.7, stable, previous 15.7 must have been spurious Plts slightly lower today  Family Communication: none  Disposition: Status is: Inpatient Remains inpatient appropriate because: acute hepatic encephalopathy  Planned Discharge Destination: Home    Time spent: 35 minutes  Author: Brendia Sacks, MD 03/08/2023 1:47 PM  For on call review [email or web address].

## 2023-03-09 DIAGNOSIS — K7682 Hepatic encephalopathy: Secondary | ICD-10-CM | POA: Diagnosis not present

## 2023-03-09 DIAGNOSIS — K7460 Unspecified cirrhosis of liver: Secondary | ICD-10-CM | POA: Diagnosis not present

## 2023-03-09 LAB — AMMONIA: Ammonia: 99 umol/L — ABNORMAL HIGH (ref 9–35)

## 2023-03-09 NOTE — Evaluation (Signed)
 Physical Therapy Evaluation Patient Details Name: [name] MRN: [id number] DOB: [birth year] Today's Date: 03/09/2023  History of Present Illness  [name] is a 68 y.o. female admitted 03/07/23 with confusion, abdominal pain. Dx with Hepatic encephalopathy. PMH includes decompensated cirrhosis, iron deficiency anemia, anemia of chronic disease, cirrhosis, blind in L eye.  Clinical Impression  Pt admitted with above diagnosis.  Pt currently with functional limitations due to the deficits listed below (see PT Problem List). Pt will benefit from acute skilled PT to increase their independence and safety with mobility to allow discharge.   Son present and assisting pt with pericare on arrival due to bowel movement in bed (pt on lactulose).  Pt's son very supportive and involved in pt's care.  Pt assisted with ambulating short distance in hallway.  Son reports recent approval by insurance for power w/c that has lift component.  Pt and son plan on pt return home upon d/c with HHPT.         If plan is discharge home, recommend the following: A little help with walking and/or transfers;A little help with bathing/dressing/bathroom;Assistance with cooking/housework   Can travel by private vehicle        Equipment Recommendations None recommended by PT  Recommendations for Other Services       Functional Status Assessment Patient has had a recent decline in their functional status and demonstrates the ability to make significant improvements in function in a reasonable and predictable amount of time.     Precautions / Restrictions Precautions Precautions: Fall Precaution/Restrictions Comments: hx of orthostatic hypotension, on lactulose      Mobility  Bed Mobility Overal bed mobility: Needs Assistance Bed Mobility: Supine to Sit     Supine to sit: Min assist     General bed mobility comments: light assist for trunk upright, mild dizziness upon sitting which resolved     Transfers Overall transfer level: Needs assistance Equipment used: Rolling walker (2 wheels) Transfers: Sit to/from Stand Sit to Stand: Contact guard assist           General transfer comment: verbal cues for hand placement    Ambulation/Gait Ambulation/Gait assistance: Contact guard assist Gait Distance (Feet): 50 Feet Assistive device: Rolling walker (2 wheels) Gait Pattern/deviations: Step-through pattern, Decreased stride length       General Gait Details: verbal cues for RW positioning, posture; son followed pt with recliner however not needed  Stairs            Wheelchair Mobility     Tilt Bed    Modified Rankin (Stroke Patients Only)       Balance           Standing balance support: No upper extremity supported Standing balance-Leahy Scale: Fair Standing balance comment: static fair but dynamic balance poor                             Pertinent Vitals/Pain Pain Assessment Pain Assessment: No/denies pain Pain Intervention(s): Monitored during session, Repositioned    Home Living Family/patient expects to be discharged to:: Private residence Living Arrangements: Children (son) Available Help at Discharge: Family;Available 24 hours/day Type of Home: House Home Access: Ramped entrance       Home Layout: One level Home Equipment: Rollator (4 wheels);Rolling Walker (2 wheels);BSC/3in1;Other (comment);Hospital bed;Wheelchair - manual Additional Comments: Pt lives with [name] - very supportive, provides assist as needed    Prior Function Prior Level of Function : Needs  assist             Mobility Comments: son typically present for all mobility; uses RW in home, manual WC outside home - he just got insurance approval for power chair ADLs Comments: Son has been assisting with ADLs lately. Pt states she was doing her own ADL tasks until recently     Extremity/Trunk Assessment        Lower Extremity Assessment Lower  Extremity Assessment: Generalized weakness    Cervical / Trunk Assessment Cervical / Trunk Assessment: Kyphotic;Other exceptions Cervical / Trunk Exceptions: history of compression fx per son  Communication   Communication Communication: No apparent difficulties    Cognition Arousal: Alert Behavior During Therapy: WFL for tasks assessed/performed   PT - Cognitive impairments: Problem solving, Safety/Judgement                         Following commands: Intact       Cueing Cueing Techniques: Verbal cues, Tactile cues     General Comments      Exercises     Assessment/Plan    PT Assessment Patient needs continued PT services  PT Problem List Decreased strength;Decreased activity tolerance;Decreased balance;Decreased mobility;Decreased knowledge of use of DME       PT Treatment Interventions DME instruction;Balance training;Gait training;Functional mobility training;Therapeutic activities;Therapeutic exercise;Patient/family education    PT Goals (Current goals can be found in the Care Plan section)  Acute Rehab PT Goals PT Goal Formulation: With patient/family Time For Goal Achievement: 03/23/23 Potential to Achieve Goals: Good    Frequency Min 1X/week     Co-evaluation               AM-PAC PT "6 Clicks" Mobility  Outcome Measure Help needed turning from your back to your side while in a flat bed without using bedrails?: A Little Help needed moving from lying on your back to sitting on the side of a flat bed without using bedrails?: A Little Help needed moving to and from a bed to a chair (including a wheelchair)?: A Little Help needed standing up from a chair using your arms (e.g., wheelchair or bedside chair)?: A Little Help needed to walk in hospital room?: A Little Help needed climbing 3-5 steps with a railing? : A Lot 6 Click Score: 17    End of Session Equipment Utilized During Treatment: Gait belt Activity Tolerance: Patient tolerated  treatment well Patient left: in chair;with call bell/phone within reach;with family/visitor present Nurse Communication: Mobility status PT Visit Diagnosis: Difficulty in walking, not elsewhere classified (R26.2)    Time: 1005-1025 PT Time Calculation (min) (ACUTE ONLY): 20 min   Charges:   PT Evaluation $PT Eval Low Complexity: 1 Low   PT General Charges $$ ACUTE PT VISIT: 1 Visit        Thomasene Mohair PT, DPT Physical Therapist Acute Rehabilitation Services Office: [phone]   Janan Halter [place] 03/09/2023, 12:03 PM

## 2023-03-09 NOTE — Progress Notes (Signed)
  Progress Note   Patient: [name] ZDG:[id number] DOB: 1955-06-12 DOA: 03/07/2023     1 DOS: the patient was seen and examined on 03/09/2023   Brief hospital course: 68 year old woman PMH including cirrhosis presenting with confusion.  Admitted for acute hepatic encephalopathy.  Consultants None   Procedures/Events None    Assessment and Plan: Acute hepatic encephalopathy, unclear if compliant with home lactulose About the same or a little worse today.  Ammonia level significantly elevated.  Continue lactulose and rifaximin.  Recheck in AM.   Decompensated cirrhosis, POA Mild thrombocytopenia Mild hyperbilirubinemia Treatment as above.  Repeat labs in AM.    Morbid obesity Recommend weight loss outpatient with regular physical activity and healthy dieting.   Hypothyroidism Continue levothyroxine   Physical debility Plan Home health PT      Subjective:  Feels about the same Bowels moving frequently Son reports still somewhat confused  Physical Exam: Vitals:   03/08/23 2236 03/09/23 0255 03/09/23 0609 03/09/23 1259  BP: (!) 136/55 (!) 145/57 137/85 (!) 111/52  Pulse: 74 73 73 75  Resp: 16 16 16 18   Temp: 98.4 F (36.9 C) 98.2 F (36.8 C) 98.1 F (36.7 C) 98.2 F (36.8 C)  TempSrc:      SpO2: 98% 99% 100% 99%  Weight:      Height:       Physical Exam Vitals reviewed.  Constitutional:      General: She is not in acute distress.    Appearance: She is not ill-appearing or toxic-appearing.  Cardiovascular:     Rate and Rhythm: Normal rate and regular rhythm.     Heart sounds: No murmur heard. Pulmonary:     Effort: Pulmonary effort is normal. No respiratory distress.     Breath sounds: No wheezing, rhonchi or rales.  Neurological:     Mental Status: She is alert.  Psychiatric:        Mood and Affect: Mood normal.        Behavior: Behavior normal.     Data Reviewed: Ammonia level up to 99, from 54 yesterday  Family Communication: son at  bedside  Disposition: Status is: Inpatient Remains inpatient appropriate because: confusion     Time spent: 20 minutes  Author: Brendia Sacks, MD 03/09/2023 6:14 PM  For on call review [email or web address].

## 2023-03-09 NOTE — Plan of Care (Addendum)
 VSS. Patient c/o headache, given one time dose of Tylenol with relief. Patient had multiple BM's overnight. No acute events this shift.  Problem: Education: Goal: Knowledge of General Education information will improve Description: Including pain rating scale, medication(s)/side effects and non-pharmacologic comfort measures Outcome: Progressing   Problem: Clinical Measurements: Goal: Ability to maintain clinical measurements within normal limits will improve Outcome: Progressing Goal: Will remain free from infection Outcome: Progressing   Problem: Activity: Goal: Risk for activity intolerance will decrease Outcome: Progressing   Problem: Pain Managment: Goal: General experience of comfort will improve and/or be controlled Outcome: Progressing   Problem: Safety: Goal: Ability to remain free from injury will improve Outcome: Progressing   Problem: Skin Integrity: Goal: Risk for impaired skin integrity will decrease Outcome: Progressing

## 2023-03-09 NOTE — TOC CM/SW Note (Signed)

## 2023-03-09 NOTE — TOC Initial Note (Signed)
 Transition of Care [facility]) - Initial/Assessment Note    Patient Details  Name: [name] MRN: [id number] Date of Birth: 27-May-1955  Transition of Care [facility]) CM/SW Contact:    Otelia Santee, LCSW Phone Number: 03/09/2023, 11:23 AM  Clinical Narrative:                 Pt from home with son. Pt has walker, Rolator, wheelchair, shower chair, hospital bed, and hoyer lift. Pt's son has a power lift chair being ordered for pt. Pt has received HH with [facility] in the past however, pt's son reports that they stopped showing up and haven't called. Pt's son is interested in having HHPT arranged with a different [facility]. Pt's son declines for HHOT being arranged and reports pt only needing PT. HHPT has been arranged with [facility]. HH order will need to be placed prior to discharge. Pt's son to provide transportation for pt at discharge.   Expected Discharge Plan: Home w Home Health Services Barriers to Discharge: No Barriers Identified   Patient Goals and CMS Choice Patient states their goals for this hospitalization and ongoing recovery are:: For pt to return home CMS Medicare.gov Compare Post Acute Care list provided to:: Patient Represenative (must comment) Choice offered to / list presented to : Adult Children [place] ownership interest in [facility].provided to::  (NA)    Expected Discharge Plan and Services In-house Referral: Clinical Social Work Discharge Planning Services: NA Post Acute Care Choice: Home Health, Durable Medical Equipment Living arrangements for the past 2 months: Single Family Home                 DME Arranged: N/A DME Agency: NA       HH Arranged: PT HH Agency: [facility] Date HH Agency Contacted: 03/09/23 Time HH Agency Contacted: 1123 Representative spoke with at [facility] Agency: Amy  Prior Living Arrangements/Services Living arrangements for the past 2 months: Single Family Home Lives with:: Adult Children Patient language and need for  interpreter reviewed:: Yes Do you feel safe going back to the place where you live?: Yes      Need for Family Participation in Patient Care: Yes (Comment) Care giver support system in place?: Yes (comment) Current home services: DME, Home PT (walker, Rolator, wheelchair, shower chair, hospital bed, hoyer, HHPT w/ [facility]) Criminal Activity/Legal Involvement Pertinent to Current Situation/Hospitalization: No - Comment as needed  Activities of Daily Living   ADL Screening (condition at time of admission) Independently performs ADLs?: No Does the patient have a NEW difficulty with bathing/dressing/toileting/self-feeding that is expected to last >3 days?: No Does the patient have a NEW difficulty with getting in/out of bed, walking, or climbing stairs that is expected to last >3 days?: No Does the patient have a NEW difficulty with communication that is expected to last >3 days?: No Is the patient deaf or have difficulty hearing?: No Does the patient have difficulty seeing, even when wearing glasses/contacts?: Yes Does the patient have difficulty concentrating, remembering, or making decisions?: No  Permission Sought/Granted Permission sought to share information with : Facility Medical sales representative, Family Supports Permission granted to share information with : Yes, Verbal Permission Granted  Share Information with NAME: Tykeria Wawrzyniak  Permission granted to share info w AGENCY: [facility]  Permission granted to share info w Relationship: Son     Emotional Assessment Appearance:: Appears older than stated age Attitude/Demeanor/Rapport: Unable to Assess Affect (typically observed): Accepting Orientation: : Oriented to Self, Oriented to Place Alcohol /  Substance Use: Not Applicable Psych Involvement: No (comment)  Admission diagnosis:  Hepatic encephalopathy (HCC) [K76.82] Patient Active Problem List   Diagnosis Date Noted   Pruritus 03/06/2023   Edema of both lower legs 03/06/2023    Insomnia 03/06/2023   Compression fracture of body of thoracic vertebra (HCC) 03/06/2023   Anemia of chronic disease 02/22/2023   Hepatic encephalopathy (HCC) 12/10/2022   Lumbar radiculopathy 12/10/2022   Grade I diastolic dysfunction 12/10/2022   History of DVT (deep vein thrombosis) 12/01/2022   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   Acute hepatic encephalopathy (HCC) 11/30/2022   DNR (do not resuscitate)/DNI(Do Not intubate) 10/05/2022   Vaginal bleeding 10/05/2022   Physical deconditioning 08/17/2022   Decompensated cirrhosis (HCC) 08/17/2022   Hypotension 08/11/2022   Folate deficiency 06/14/2022   Systolic murmur 06/12/2022   Iron deficiency anemia 05/16/2022   Thrombocytopenia (HCC) 05/16/2022   History of eye surgery 05/01/2022   Corneal abnormality 04/29/2022   Status post eye surgery 04/29/2022   Corneal perforation of left eye 04/18/2022   Pseudophakia of left eye 02/08/2022   Mastitis 12/15/2021   Physical debility 12/10/2021   Palliative care encounter 12/01/2021   Postmenopausal 11/24/2021   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   Polypharmacy 06/22/2021   Hyperammonemia (HCC) 06/17/2021   Former smoker 01/06/2021   Combined form of age-related cataract, right eye 10/01/2020   Corneal scarring 09/10/2020   Cerebral atherosclerosis 08/20/2020   Short gut syndrome 08/20/2020   DDD (degenerative disc disease), lumbar 08/20/2020   Abdominal wall hernia 03/06/2020   Splenomegaly 02/06/2019   Vitamin B12 deficiency 01/21/2019   Depression, major, recurrent, moderate (HCC) 09/10/2018   Hyperuricemia 08/25/2016   GERD (gastroesophageal reflux disease) 07/25/2016   Hypothyroidism 07/25/2016   Microcytic anemia 04/12/2016   Vitamin D deficiency 05/04/2015   History of adenomatous polyp of colon 04/28/2015   H/O angioedema 11/15/2014   Morbid obesity (HCC) 07/25/2014    Chronic diarrhea 06/01/2014   Hypokalemia 06/01/2014   PCP:  [name], MD Pharmacy:   [facility] [phone] - [place] - [address] AT [facility] [address] [place] [address] Phone: [phone] Fax: [phone]  [place] [facility] [address] [place] [address] Phone: [phone] Fax: [phone]     Social Drivers of Health (SDOH) Social History: SDOH Screenings   Food Insecurity: No Food Insecurity (03/09/2023)  Housing: Low Risk  (03/09/2023)  Transportation Needs: No Transportation Needs (03/09/2023)  Utilities: Not At Risk (03/09/2023)  Depression (PHQ2-9): Medium Risk (03/06/2023)  Social Connections: Socially Isolated (03/09/2023)  Tobacco Use: Medium Risk (03/08/2023)   SDOH Interventions:     Readmission Risk Interventions    03/09/2023   11:21 AM 10/19/2022   10:12 AM 08/15/2022    3:20 PM  Readmission Risk Prevention Plan  Transportation Screening Complete Complete Complete  Medication Review Oceanographer) Complete Complete Complete  PCP or Specialist appointment within 3-5 days of discharge Complete Complete Complete  HRI or Home Care Consult Complete Complete Complete  SW Recovery Care/Counseling Consult Complete Complete Complete  Palliative Care Screening Not Applicable Not Applicable Not Applicable  Skilled Nursing Facility Not Applicable Not Applicable Not Applicable

## 2023-03-10 DIAGNOSIS — D696 Thrombocytopenia, unspecified: Secondary | ICD-10-CM | POA: Diagnosis not present

## 2023-03-10 DIAGNOSIS — K7460 Unspecified cirrhosis of liver: Secondary | ICD-10-CM | POA: Diagnosis not present

## 2023-03-10 DIAGNOSIS — K7682 Hepatic encephalopathy: Secondary | ICD-10-CM | POA: Diagnosis not present

## 2023-03-10 LAB — COMPREHENSIVE METABOLIC PANEL
ALT: 20 U/L (ref 0–44)
AST: 30 U/L (ref 15–41)
Albumin: 2.3 g/dL — ABNORMAL LOW (ref 3.5–5.0)
Alkaline Phosphatase: 79 U/L (ref 38–126)
Anion gap: 6 (ref 5–15)
BUN: 19 mg/dL (ref 8–23)
CO2: 18 mmol/L — ABNORMAL LOW (ref 22–32)
Calcium: 8.2 mg/dL — ABNORMAL LOW (ref 8.9–10.3)
Chloride: 114 mmol/L — ABNORMAL HIGH (ref 98–111)
Creatinine, Ser: 0.73 mg/dL (ref 0.44–1.00)
GFR, Estimated: >60 mL/min (ref >60)
Glucose, Bld: 87 mg/dL (ref 70–99)
Potassium: 3.7 mmol/L (ref 3.5–5.1)
Sodium: 138 mmol/L (ref 135–145)
Total Bilirubin: 1.9 mg/dL — ABNORMAL HIGH (ref 0.0–1.2)
Total Protein: 5.1 g/dL — ABNORMAL LOW (ref 6.5–8.1)

## 2023-03-10 LAB — CULTURE, BLOOD (ROUTINE X 2)
Culture: NO GROWTH
Culture: NO GROWTH
Special Requests: ADEQUATE

## 2023-03-10 LAB — AMMONIA: Ammonia: 45 umol/L — ABNORMAL HIGH (ref 9–35)

## 2023-03-10 MED ORDER — OXYCODONE HCL 5 MG PO TABS
5.0000 mg | ORAL_TABLET | Freq: Four times a day (QID) | ORAL | Status: DC | PRN
  Administered 2023-03-10 – 2023-03-12 (×5): 5 mg via ORAL
  Filled 2023-03-10 (×5): qty 1

## 2023-03-10 NOTE — Plan of Care (Signed)

## 2023-03-10 NOTE — Progress Notes (Signed)
  Progress Note   Patient: [name] ZOX:[id number] DOB: [birth year] DOA: 03/07/2023     2 DOS: the patient was seen and examined on 03/10/2023   Brief hospital course: 68 year old woman PMH including cirrhosis presenting with confusion.  Admitted for acute hepatic encephalopathy.  Consultants None   Procedures/Events None    Assessment and Plan: Acute hepatic encephalopathy, unclear if compliant with home lactulose Clinically slightly better today.  Ammonia is down but has been labile.  Continue present management check ammonia level in AM.   Decompensated cirrhosis, POA Mild thrombocytopenia Mild hyperbilirubinemia Treatment as above.  Labs are stable otherwise.    Morbid obesity Recommend weight loss outpatient with regular physical activity and healthy dieting.   Hypothyroidism Continue levothyroxine   Physical debility Plan Home health PT      Subjective:  Feels about the same Still "fuzzy" Bowels moving regularly  Physical Exam: Vitals:   03/09/23 0609 03/09/23 1259 03/09/23 1920 03/10/23 0445  BP: 137/85 (!) 111/52 (!) 141/64 119/61  Pulse: 73 75 72 66  Resp: 16 18 20 18   Temp: 98.1 F (36.7 C) 98.2 F (36.8 C) 97.9 F (36.6 C) 98.2 F (36.8 C)  TempSrc:   Oral   SpO2: 100% 99% 100% 99%  Weight:      Height:       Physical Exam Vitals reviewed.  Constitutional:      General: She is not in acute distress.    Appearance: She is not ill-appearing or toxic-appearing.  Cardiovascular:     Rate and Rhythm: Normal rate and regular rhythm.     Heart sounds: No murmur heard. Pulmonary:     Effort: Pulmonary effort is normal. No respiratory distress.     Breath sounds: No wheezing, rhonchi or rales.  Abdominal:     Palpations: Abdomen is soft.  Neurological:     Mental Status: She is alert.  Psychiatric:        Mood and Affect: Mood normal.        Behavior: Behavior normal.     Data Reviewed: Ammonia down to 45 CMP stable BM x5  Family  Communication: none  Disposition: Status is: Inpatient Remains inpatient appropriate because: confused     Time spent: 20 minutes  Author: Brendia Sacks, MD 03/10/2023 9:03 AM  For on call review [email or web address].

## 2023-03-10 NOTE — Plan of Care (Signed)

## 2023-03-10 NOTE — Progress Notes (Signed)
 Occupational Therapy Treatment Patient Details Name: [name] MRN: [id number] DOB: [birth year] Today's Date: 03/10/2023   History of present illness [name] is a 68 y.o. female admitted 03/07/23 with confusion, abdominal pain. Dx with Hepatic encephalopathy. PMH includes decompensated cirrhosis, iron deficiency anemia, anemia of chronic disease, cirrhosis, blind in L eye.   OT comments  Patient progressing and showed improved bed mobility with less assistance needed compared to previous session.   Patient remains limited by mild cognitive deficits, generalized weakness and decreased activity tolerance with pt describing MAX effort or 10/10 RPE after performing 2 standing grooming tasks at sink.   Pt continues to demonstrate good rehab potential and would benefit from continued skilled OT to increase safety and independence with ADLs and functional transfers to allow pt to return home safely and reduce caregiver burden and fall risk.       If plan is discharge home, recommend the following:  A little help with walking and/or transfers;A lot of help with bathing/dressing/bathroom;Assistance with cooking/housework;Assist for transportation;Help with stairs or ramp for entrance   Equipment Recommendations  None recommended by OT    Recommendations for Other Services      Precautions / Restrictions Precautions Precautions: Fall Precaution/Restrictions Comments: hx of orthostatic hypotension, on lactulose Restrictions Weight Bearing Restrictions Per Provider Order: No       Mobility Bed Mobility Overal bed mobility: Needs Assistance Bed Mobility: Supine to Sit     Supine to sit: Contact guard, HOB elevated, Used rails Sit to supine: Contact guard assist, Used rails        Transfers                         Balance Overall balance assessment: Needs assistance Sitting-balance support: No upper extremity supported, Feet supported       Standing balance  support: No upper extremity supported Standing balance-Leahy Scale: Fair Standing balance comment: static fair but dynamic balance poor.                           ADL either performed or assessed with clinical judgement   ADL       Grooming: Oral care;Wash/dry face;Standing;Supervision/safety Grooming Details (indicate cue type and reason): Pt unable to contiue grooming involving hair due to fatigue after standing at sink for 2 tasks. Total Assist provided for hair combing once pt sitting EOB for rest.         Upper Body Dressing : Set up;Sitting Upper Body Dressing Details (indicate cue type and reason): posterior gown     Toilet Transfer: Contact guard assist;Rolling walker (2 wheels);Cueing for safety Toilet Transfer Details (indicate cue type and reason): Pt stood from EOB tgo RW with CGA and ambulated ~4 steps to sink for grooming. Pt then ambulated back to EOB all with RW and CGA. Cues for safety as pt leaves RW behind too soon before sitting.         Functional mobility during ADLs: Contact guard assist;Rolling walker (2 wheels) General ADL Comments: decreased activity tolerance, balance, cognition.  Pt reports 10/10 effort on RPE with 2 standing grooming tasks.    Extremity/Trunk Assessment Upper Extremity Assessment Upper Extremity Assessment: Generalized weakness   Lower Extremity Assessment Lower Extremity Assessment: Generalized weakness   Cervical / Trunk Assessment Cervical / Trunk Assessment: Kyphotic;Other exceptions Cervical / Trunk Exceptions: history of compression fx per son    Vision  Perception     Praxis     Communication Communication Communication: No apparent difficulties   Cognition Arousal: Alert Behavior During Therapy: WFL for tasks assessed/performed Cognition: Cognition impaired     Awareness: Intellectual awareness intact, Online awareness impaired Memory impairment (select all impairments): Declarative  long-term memory, Non-declarative long-term memory Attention impairment (select first level of impairment): Sustained attention Executive functioning impairment (select all impairments): Reasoning, Problem solving OT - Cognition Comments: Somnolent today, awoken from a nap.                 Following commands: Intact (for 1-step commands)        Cueing   Cueing Techniques: Verbal cues, Tactile cues  Exercises Other Exercises Other Exercises: Pt provided with handouts on energy conservation but not yet adressed as MD in room to speak with pt. Pt encouraged to read when she is able.    Shoulder Instructions       General Comments      Pertinent Vitals/ Pain       Pain Assessment Pain Assessment: 0-10 Pain Score: 10-Worst pain ever Pain Location: back Pain Descriptors / Indicators: Discomfort, Sore Pain Intervention(s): Limited activity within patient's tolerance, Monitored during session, Repositioned, Patient requesting pain meds-RN notified, Other (comment) (RN and MD to room and notiifed of pain.)  Home Living                                          Prior Functioning/Environment              Frequency  Min 1X/week        Progress Toward Goals  OT Goals(current goals can now be found in the care plan section)  Progress towards OT goals: Progressing toward goals  Acute Rehab OT Goals Patient Stated Goal: Get home to pets OT Goal Formulation: With patient Time For Goal Achievement: 03/22/23 Potential to Achieve Goals: Good  Plan      Co-evaluation                 AM-PAC OT "6 Clicks" Daily Activity     Outcome Measure   Help from another person eating meals?: None Help from another person taking care of personal grooming?: A Little Help from another person toileting, which includes using toliet, bedpan, or urinal?: A Little Help from another person bathing (including washing, rinsing, drying)?: A Lot Help from another  person to put on and taking off regular upper body clothing?: A Little Help from another person to put on and taking off regular lower body clothing?: A Lot 6 Click Score: 17    End of Session Equipment Utilized During Treatment: Gait belt;Rolling walker (2 wheels)  OT Visit Diagnosis: Unsteadiness on feet (R26.81);Muscle weakness (generalized) (M62.81);Pain Pain - part of body:  (back)   Activity Tolerance Patient limited by fatigue;Patient limited by pain   Patient Left in bed;with call bell/phone within reach;with nursing/sitter in room;with bed alarm set   Nurse Communication Patient requests pain meds        Time: 1352-1415 OT Time Calculation (min): 23 min  Charges: OT General Charges $OT Visit: 1 Visit OT Treatments $Self Care/Home Management : 8-22 mins $Therapeutic Activity: 8-22 mins  Victorino Dike, OT Acute Rehab Services Office: [phone] 03/10/2023   Theodoro Clock 03/10/2023, 2:25 PM

## 2023-03-10 NOTE — Plan of Care (Addendum)
 VSS. No c/o pain. Patient given PRN Compazine x1. Patient had multiple BM's overnight. No acute events overnight.  Problem: Education: Goal: Knowledge of General Education information will improve Description: Including pain rating scale, medication(s)/side effects and non-pharmacologic comfort measures Outcome: Progressing   Problem: Clinical Measurements: Goal: Ability to maintain clinical measurements within normal limits will improve Outcome: Progressing   Problem: Activity: Goal: Risk for activity intolerance will decrease Outcome: Progressing   Problem: Nutrition: Goal: Adequate nutrition will be maintained Outcome: Progressing   Problem: Elimination: Goal: Will not experience complications related to bowel motility Outcome: Progressing   Problem: Skin Integrity: Goal: Risk for impaired skin integrity will decrease Outcome: Progressing

## 2023-03-11 DIAGNOSIS — K7460 Unspecified cirrhosis of liver: Secondary | ICD-10-CM | POA: Diagnosis not present

## 2023-03-11 DIAGNOSIS — D696 Thrombocytopenia, unspecified: Secondary | ICD-10-CM | POA: Diagnosis not present

## 2023-03-11 DIAGNOSIS — K7682 Hepatic encephalopathy: Secondary | ICD-10-CM | POA: Diagnosis not present

## 2023-03-11 LAB — COMPREHENSIVE METABOLIC PANEL
ALT: 24 U/L (ref 0–44)
AST: 39 U/L (ref 15–41)
Albumin: 2.7 g/dL — ABNORMAL LOW (ref 3.5–5.0)
Alkaline Phosphatase: 89 U/L (ref 38–126)
Anion gap: 8 (ref 5–15)
BUN: 19 mg/dL (ref 8–23)
CO2: 20 mmol/L — ABNORMAL LOW (ref 22–32)
Calcium: 8.9 mg/dL (ref 8.9–10.3)
Chloride: 112 mmol/L — ABNORMAL HIGH (ref 98–111)
Creatinine, Ser: 0.89 mg/dL (ref 0.44–1.00)
GFR, Estimated: >60 mL/min (ref >60)
Glucose, Bld: 100 mg/dL — ABNORMAL HIGH (ref 70–99)
Potassium: 3.7 mmol/L (ref 3.5–5.1)
Sodium: 140 mmol/L (ref 135–145)
Total Bilirubin: 1.9 mg/dL — ABNORMAL HIGH (ref 0.0–1.2)
Total Protein: 5.8 g/dL — ABNORMAL LOW (ref 6.5–8.1)

## 2023-03-11 LAB — AMMONIA: Ammonia: 65 umol/L — ABNORMAL HIGH (ref 9–35)

## 2023-03-11 NOTE — Progress Notes (Signed)
  Progress Note   Patient: [name] FAO:[id number] DOB: [birth year] DOA: 03/07/2023     3 DOS: the patient was seen and examined on 03/11/2023   Brief hospital course: 68 year old woman PMH including cirrhosis presenting with confusion.  Admitted for acute hepatic encephalopathy.  Consultants None   Procedures/Events None    Assessment and Plan: Acute hepatic encephalopathy, unclear if compliant with home lactulose Ammonia level significantly improved.  Clinically a bit better.  Continue bowel regimen and recheck in AM.    Decompensated cirrhosis, POA Mild thrombocytopenia Mild hyperbilirubinemia Continue treatment as above.    Morbid obesity Recommend weight loss outpatient with regular physical activity and healthy dieting.   Hypothyroidism Continue levothyroxine   Physical debility Plan Home health PT      Subjective:  Groggy after some medication today Bowels moving Feels okay  Physical Exam: Vitals:   03/10/23 0445 03/10/23 1212 03/10/23 1941 03/11/23 0506  BP: 119/61 (!) 121/92 (!) 124/55 113/61  Pulse: 66 94 71 68  Resp: 18 18 19 18   Temp: 98.2 F (36.8 C) 98.5 F (36.9 C) 97.7 F (36.5 C) 97.7 F (36.5 C)  TempSrc:  Oral Oral Oral  SpO2: 99% 97% 100% 99%  Weight:      Height:       Physical Exam Vitals reviewed.  Constitutional:      General: She is not in acute distress.    Appearance: She is not ill-appearing.  Cardiovascular:     Rate and Rhythm: Normal rate and regular rhythm.     Heart sounds: No murmur heard. Pulmonary:     Effort: Pulmonary effort is normal. No respiratory distress.     Breath sounds: No wheezing, rhonchi or rales.  Neurological:     Mental Status: She is alert.  Psychiatric:        Mood and Affect: Mood normal.        Behavior: Behavior normal.     Data Reviewed: BM x5 CMP unremarkable.  Family Communication: son at bedside  Disposition: Status is: Inpatient Remains inpatient appropriate because:  hepatic encephalopathy     Time spent: 20 minutes  Author: Brendia Sacks, MD 03/11/2023 7:53 AM  For on call review [email or web address].

## 2023-03-11 NOTE — Progress Notes (Signed)
 Mobility Specialist - Progress Note   03/11/23 1428  Mobility  Activity Ambulated with assistance in hallway  Level of Assistance Standby assist, set-up cues, supervision of patient - no hands on  Assistive Device Front wheel walker  Distance Ambulated (ft) 80 ft  Activity Response Tolerated well  Mobility Referral Yes  Mobility visit 1 Mobility  Mobility Specialist Start Time (ACUTE ONLY) 1416  Mobility Specialist Stop Time (ACUTE ONLY) 1423  Mobility Specialist Time Calculation (min) (ACUTE ONLY) 7 min   Pt received in bed and agreeable to mobility. Distance limited d/t knee pain. No complaints during session. Pt to [facility] after session with all needs met. Instructed pt to pull call bell when finished.   [facility]

## 2023-03-12 DIAGNOSIS — K7460 Unspecified cirrhosis of liver: Secondary | ICD-10-CM | POA: Diagnosis not present

## 2023-03-12 DIAGNOSIS — K7682 Hepatic encephalopathy: Secondary | ICD-10-CM | POA: Diagnosis not present

## 2023-03-12 LAB — COMPREHENSIVE METABOLIC PANEL
ALT: 22 U/L (ref 0–44)
AST: 39 U/L (ref 15–41)
Albumin: 2.3 g/dL — ABNORMAL LOW (ref 3.5–5.0)
Alkaline Phosphatase: 71 U/L (ref 38–126)
Anion gap: 5 (ref 5–15)
BUN: 20 mg/dL (ref 8–23)
CO2: 16 mmol/L — ABNORMAL LOW (ref 22–32)
Calcium: 8 mg/dL — ABNORMAL LOW (ref 8.9–10.3)
Chloride: 112 mmol/L — ABNORMAL HIGH (ref 98–111)
Creatinine, Ser: 0.93 mg/dL (ref 0.44–1.00)
GFR, Estimated: >60 mL/min (ref >60)
Glucose, Bld: 84 mg/dL (ref 70–99)
Potassium: 3.8 mmol/L (ref 3.5–5.1)
Sodium: 133 mmol/L — ABNORMAL LOW (ref 135–145)
Total Bilirubin: 1.7 mg/dL — ABNORMAL HIGH (ref 0.0–1.2)
Total Protein: 5.1 g/dL — ABNORMAL LOW (ref 6.5–8.1)

## 2023-03-12 LAB — AMMONIA: Ammonia: 57 umol/L — ABNORMAL HIGH (ref 9–35)

## 2023-03-12 NOTE — Plan of Care (Signed)
  Problem: Clinical Measurements: Goal: Will remain free from infection Outcome: Progressing   Problem: Nutrition: Goal: Adequate nutrition will be maintained Outcome: Progressing   Problem: Pain Managment: Goal: General experience of comfort will improve and/or be controlled Outcome: Progressing   Problem: Safety: Goal: Ability to remain free from injury will improve Outcome: Progressing

## 2023-03-12 NOTE — Plan of Care (Signed)
  Problem: Clinical Measurements: Goal: Diagnostic test results will improve Outcome: Progressing Goal: Respiratory complications will improve Outcome: Progressing Goal: Cardiovascular complication will be avoided Outcome: Progressing   Problem: Pain Managment: Goal: General experience of comfort will improve and/or be controlled Outcome: Progressing   Problem: Safety: Goal: Ability to remain free from injury will improve Outcome: Progressing

## 2023-03-12 NOTE — Progress Notes (Signed)
  Progress Note   Patient: [name] XBJ:[id number] DOB: [birth year] DOA: 03/07/2023     4 DOS: the patient was seen and examined on 03/12/2023   Brief hospital course: 68 year old woman PMH including cirrhosis presenting with confusion.  Admitted for acute hepatic encephalopathy.  Consultants None   Procedures/Events None   Assessment and Plan: Acute hepatic encephalopathy, unclear if compliant with home lactulose Much better today clinically.  Ammonia level down.  Continue present management.  If continues to improve likely home tomorrow.   Decompensated cirrhosis, POA Mild thrombocytopenia Mild hyperbilirubinemia Continue treatment as above.    Morbid obesity Recommend weight loss outpatient with regular physical activity and healthy dieting.   Hypothyroidism Continue levothyroxine   Physical debility Plan Home health PT     Subjective:  Feels better Ate a little bit Abdomen does not hurt  Physical Exam: Vitals:   03/11/23 1331 03/11/23 2004 03/12/23 0616 03/12/23 1151  BP: 113/60 (!) 114/59 134/62 (!) 104/57  Pulse: 93 88 92 78  Resp:  16 16 19   Temp: 99.2 F (37.3 C) 98.5 F (36.9 C) 99.4 F (37.4 C) 98.7 F (37.1 C)  TempSrc:    Oral  SpO2: 98% 95% 94% 96%  Weight:      Height:       Physical Exam Vitals reviewed.  Constitutional:      General: She is not in acute distress.    Appearance: She is not ill-appearing or toxic-appearing.  Cardiovascular:     Rate and Rhythm: Normal rate and regular rhythm.     Heart sounds: No murmur heard. Pulmonary:     Effort: Pulmonary effort is normal. No respiratory distress.     Breath sounds: No wheezing, rhonchi or rales.  Neurological:     Mental Status: She is alert.  Psychiatric:        Mood and Affect: Mood normal.        Behavior: Behavior normal.     Comments: Bright affect today     Data Reviewed: CMP noted.  Ammonia down to 57.  Family Communication: Son at bedside  Disposition: Status  is: Inpatient Remains inpatient appropriate because: Hepatic encephalopathy     Time spent: 20 minutes  Author: Brendia Sacks, MD 03/12/2023 3:43 PM  For on call review [email or web address].

## 2023-03-12 NOTE — Progress Notes (Signed)
 Mobility Specialist - Progress Note   03/12/23 1344  Mobility  Activity Ambulated with assistance in hallway  Level of Assistance Standby assist, set-up cues, supervision of patient - no hands on  Assistive Device Front wheel walker  Distance Ambulated (ft) 230 ft  Activity Response Tolerated well  Mobility Referral Yes  Mobility visit 1 Mobility  Mobility Specialist Start Time (ACUTE ONLY) 1314  Mobility Specialist Stop Time (ACUTE ONLY) 1343  Mobility Specialist Time Calculation (min) (ACUTE ONLY) 29 min   Pt received in bed and agreeable to mobility. No complaints during session. Pt to bed after session with all needs met.    [facility]

## 2023-03-13 DIAGNOSIS — K7682 Hepatic encephalopathy: Secondary | ICD-10-CM | POA: Diagnosis not present

## 2023-03-13 DIAGNOSIS — D696 Thrombocytopenia, unspecified: Secondary | ICD-10-CM | POA: Diagnosis not present

## 2023-03-13 DIAGNOSIS — K7460 Unspecified cirrhosis of liver: Secondary | ICD-10-CM | POA: Diagnosis not present

## 2023-03-13 NOTE — Discharge Summary (Signed)
 Physician Discharge Summary   Patient: [name] MRN: [id number] DOB: [birth year]  Admit date:     03/07/2023  Discharge date: 03/13/23  Discharge Physician: Brendia Sacks   PCP: [name], MD   Recommendations at discharge:   Ongoing care for cirrhosis  Discharge Diagnoses: Active Problems:   Acute hepatic encephalopathy (HCC)   Cirrhosis due to NASH   Thrombocytopenia (HCC)   Morbid obesity (HCC)  Resolved Problems:   * No resolved hospital problems. *  Hospital Course: 68 year old woman PMH including cirrhosis presenting with confusion.  Admitted for acute hepatic encephalopathy.  Treated with lactulose and statin therapy with gradual clinical improvement.  Consultants None   Procedures/Events None   Acute hepatic encephalopathy, unclear if compliant with home lactulose Back to baseline.  Responded well to lactulose.  Suspect her intermittent presentation is related to noncompliance.   Decompensated cirrhosis, POA Mild thrombocytopenia Mild hyperbilirubinemia Continue treatment as above.    Morbid obesity    Hypothyroidism Continue levothyroxine   Physical debility Plan Home health PT  Disposition: Home Diet recommendation:  Cardiac diet DISCHARGE MEDICATION: Allergies as of 03/13/2023       Reactions   Ace Inhibitors Anaphylaxis, Swelling   Ivp Dye [iodinated Contrast Media] Anaphylaxis   Desitin [zinc Oxide] Rash, Other (See Comments)   Worsens rash   Gadolinium Derivatives Other (See Comments)   Reaction??   Hydroxyzine Other (See Comments)   Affects the mind   Chlorhexidine Rash, Other (See Comments)   WORSENS RASHES   Doxycycline Rash   Penicillins Rash   Zofran [ondansetron] Rash        Medication List     STOP taking these medications    azithromycin 250 MG tablet Commonly known as: Zithromax Z-Pak       TAKE these medications    albuterol 108 (90 Base) MCG/ACT inhaler Commonly known as: VENTOLIN HFA Inhale 2  puffs into the lungs every 6 (six) hours as needed for shortness of breath or wheezing.   atorvastatin 40 MG tablet Commonly known as: LIPITOR Take 1 tablet (40 mg total) by mouth in the morning.   cyanocobalamin 1000 MCG tablet Commonly known as: VITAMIN B12 Take 2,000 mcg by mouth every Friday.   dicyclomine 10 MG capsule Commonly known as: BENTYL Take 1 capsule (10 mg total) by mouth 3 (three) times daily as needed for spasms (abdominal cramping).   famotidine 20 MG tablet Commonly known as: PEPCID Take 20 mg by mouth in the morning.   folic acid 1 MG tablet Commonly known as: FOLVITE Take 1 tablet (1 mg total) by mouth daily.   furosemide 40 MG tablet Commonly known as: LASIX Take 1 tablet (40 mg total) by mouth daily. Take additional 40 mg if there is a weight gain of 3 lbs in1 day or 5 lbs in 1 week.   HYDROcodone-acetaminophen 10-325 MG tablet Commonly known as: NORCO Take 1 tablet by mouth every 8 (eight) hours as needed for moderate pain (pain score 4-6).   IRON-VITAMIN C PO Take 1 tablet by mouth daily with breakfast.   lactulose 10 GM/15ML solution Commonly known as: CHRONULAC Take 30 mLs (20 g total) by mouth 3 (three) times daily. Take to ensure that there is 2 loose bowel movement EVERY DAY What changed:  when to take this reasons to take this additional instructions   levothyroxine 150 MCG tablet Commonly known as: SYNTHROID Take 150 mcg by mouth daily before breakfast.   lidocaine 5 % Commonly known as:  LIDODERM Place 1 patch onto the skin daily. Remove & Discard patch within 12 hours or as directed by MD What changed:  when to take this reasons to take this additional instructions   megestrol 40 MG tablet Commonly known as: MEGACE Take 1 tablet (40 mg total) by mouth 2 (two) times daily.   methocarbamol 500 MG tablet Commonly known as: ROBAXIN Take 500 mg by mouth in the morning.   midodrine 10 MG tablet Commonly known as:  PROAMATINE Take 1 tablet (10 mg total) by mouth 3 (three) times daily with meals.   naloxone 4 MG/0.1ML Liqd nasal spray kit Commonly known as: NARCAN Place 1 spray into the nose as needed (accidental overdose).   nystatin cream Commonly known as: MYCOSTATIN Apply topically 2 (two) times daily. What changed: Another medication with the same name was removed. Continue taking this medication, and follow the directions you see here.   pantoprazole 40 MG tablet Commonly known as: PROTONIX Take 1 tablet (40 mg total) by mouth 2 (two) times daily before a meal.   potassium chloride 10 MEQ tablet Commonly known as: KLOR-CON Take 20 mEq by mouth 2 (two) times daily.   promethazine 12.5 MG tablet Commonly known as: PHENERGAN Take 12.5 mg by mouth every 6 (six) hours as needed for nausea or vomiting.   rifaximin 550 MG Tabs tablet Commonly known as: XIFAXAN Take 550 mg by mouth 2 (two) times daily.   rOPINIRole 0.25 MG tablet Commonly known as: REQUIP Take 1 tablet (0.25 mg total) by mouth at bedtime. What changed: when to take this   spironolactone 50 MG tablet Commonly known as: ALDACTONE Take 1 tablet (50 mg total) by mouth daily.   tamsulosin 0.4 MG Caps capsule Commonly known as: FLOMAX Take 1 capsule (0.4 mg total) by mouth daily.   TobraDex ophthalmic ointment Generic drug: tobramycin-dexamethasone Place 1 Application into both eyes 2 (two) times daily.   triamcinolone cream 0.5 % Commonly known as: KENALOG Apply to affected area of right breast twice daily as directed. What changed:  how much to take when to take this reasons to take this   triamcinolone cream 0.1 % Commonly known as: KENALOG Apply BID to affected areas. What changed: Another medication with the same name was changed. Make sure you understand how and when to take each.   venlafaxine XR 75 MG 24 hr capsule Commonly known as: EFFEXOR-XR Take 75 mg by mouth daily with breakfast.   Vitamin D  (Ergocalciferol) 1.25 MG (50000 UNIT) Caps capsule Commonly known as: DRISDOL Take 50,000 Units by mouth every Wednesday.        Follow-up Information     [facility] Follow up.   Why: Iantha Fallen will follow up with you at discharge to provide home health physical therapy Contact information: [address] [place] [address] [phone]         [name], MD. Schedule an appointment as soon as possible for a visit in 1 week(s).   Specialty: Family Medicine Contact information: [address] [place] [address] [phone]               Feels better  Discharge Exam: Filed Weights   03/08/23 1937  Weight: 94.6 kg   Physical Exam Vitals reviewed.  Constitutional:      General: She is not in acute distress.    Appearance: She is not ill-appearing or toxic-appearing.  Cardiovascular:     Rate  and Rhythm: Normal rate and regular rhythm.     Heart sounds: No murmur heard. Pulmonary:     Effort: Pulmonary effort is normal. No respiratory distress.     Breath sounds: No wheezing, rhonchi or rales.  Neurological:     Mental Status: She is alert.  Psychiatric:        Mood and Affect: Mood normal.        Behavior: Behavior normal.      Condition at discharge: good  The results of significant diagnostics from this hospitalization (including imaging, microbiology, ancillary and laboratory) are listed below for reference.   Imaging Studies: DG Chest Portable 1 View Result Date: 03/07/2023 CLINICAL DATA:  Altered mental status. EXAM: PORTABLE CHEST 1 VIEW COMPARISON:  X-ray 03/04/2023 FINDINGS: Under penetrated radiograph with underinflation. Stable cardiopericardial silhouette with calcified tortuous aorta. No consolidation, pneumothorax or effusion. No edema. Overlapping gown snaps. IMPRESSION: Limited radiograph.  Grossly no acute cardiopulmonary disease. Electronically Signed   By: Karen Kays M.D.   On:  03/07/2023 17:36   CT HEAD WO CONTRAST ( ) Result Date: 03/05/2023 CLINICAL DATA:  Delirium/altered mental status, cirrhosis with elevated ammonia levels EXAM: CT HEAD WITHOUT CONTRAST TECHNIQUE: Contiguous axial images were obtained from the base of the skull through the vertex without intravenous contrast. RADIATION DOSE REDUCTION: This exam was performed according to the departmental dose-optimization program which includes automated exposure control, adjustment of the mA and/or kV according to patient size and/or use of iterative reconstruction technique. COMPARISON:  02/06/2023 FINDINGS: Brain: No evidence of acute infarction, hemorrhage, hydrocephalus, extra-axial collection or mass lesion/mass effect. Subcortical white matter and periventricular small vessel ischemic changes. Vascular: Intracranial atherosclerosis. Skull: Normal. Negative for fracture or focal lesion. Sinuses/Orbits: Partial opacification the right maxillary sinus. Visualized paranasal sinuses and mastoid air cells are otherwise clear. Other: None. IMPRESSION: No acute intracranial abnormality. Small vessel ischemic changes. Electronically Signed   By: Charline Bills M.D.   On: 03/05/2023 02:32   DG Chest Portable 1 View Result Date: 03/04/2023 CLINICAL DATA:  Altered mental status. EXAM: PORTABLE CHEST 1 VIEW COMPARISON:  02/06/2023 FINDINGS: Cardiomediastinal contours are within normal limits. Peribronchial thickening and interstitial prominence. No effusions or acute bony abnormality. IMPRESSION: Peribronchial thickening and interstitial prominence could reflect bronchitis or atypical infection. Electronically Signed   By: Charlett Nose M.D.   On: 03/04/2023 23:11    Microbiology: Results for orders placed or performed during the hospital encounter of 03/04/23  Resp panel by RT-PCR (RSV, Flu A&B, Covid) Anterior Nasal Swab     Status: None   Collection Time: 03/04/23 10:08 PM   Specimen: Anterior Nasal Swab  Result Value  Ref Range Status   SARS Coronavirus 2 by RT PCR NEGATIVE NEGATIVE Final    Comment: (NOTE) SARS-CoV-2 target nucleic acids are NOT DETECTED.  The SARS-CoV-2 RNA is generally detectable in upper respiratory specimens during the acute phase of infection. The lowest concentration of SARS-CoV-2 viral copies this assay can detect is 138 copies/mL. A negative result does not preclude SARS-Cov-2 infection and should not be used as the sole basis for treatment or other patient management decisions. A negative result may occur with  improper specimen collection/handling, submission of specimen other than nasopharyngeal swab, presence of viral mutation(s) within the areas targeted by this assay, and inadequate number of viral copies(<138 copies/mL). A negative result must be combined with clinical observations, patient history, and epidemiological information. The expected result is Negative.  Fact Sheet for Patients:  BloggerCourse.com  Fact Sheet  for Healthcare Providers:  SeriousBroker.it  This test is no t yet approved or cleared by the [place] and  has been authorized for detection and/or diagnosis of SARS-CoV-2 by FDA under an Emergency Use Authorization (EUA). This EUA will remain  in effect (meaning this test can be used) for the duration of the COVID-19 declaration under Section 564(b)(1) of the Act, 21 U.S.C.section 360bbb-3(b)(1), unless the authorization is terminated  or revoked sooner.       Influenza A by PCR NEGATIVE NEGATIVE Final   Influenza B by PCR NEGATIVE NEGATIVE Final    Comment: (NOTE) The Xpert Xpress SARS-CoV-2/FLU/RSV plus assay is intended as an aid in the diagnosis of influenza from Nasopharyngeal swab specimens and should not be used as a sole basis for treatment. Nasal washings and aspirates are unacceptable for Xpert Xpress SARS-CoV-2/FLU/RSV testing.  Fact Sheet for  Patients: BloggerCourse.com  Fact Sheet for Healthcare Providers: SeriousBroker.it  This test is not yet approved or cleared by the [place] FDA and has been authorized for detection and/or diagnosis of SARS-CoV-2 by FDA under an Emergency Use Authorization (EUA). This EUA will remain in effect (meaning this test can be used) for the duration of the COVID-19 declaration under Section 564(b)(1) of the Act, 21 U.S.C. section 360bbb-3(b)(1), unless the authorization is terminated or revoked.     Resp Syncytial Virus by PCR NEGATIVE NEGATIVE Final    Comment: (NOTE) Fact Sheet for Patients: BloggerCourse.com  Fact Sheet for Healthcare Providers: SeriousBroker.it  This test is not yet approved or cleared by the [place] FDA and has been authorized for detection and/or diagnosis of SARS-CoV-2 by FDA under an Emergency Use Authorization (EUA). This EUA will remain in effect (meaning this test can be used) for the duration of the COVID-19 declaration under Section 564(b)(1) of the Act, 21 U.S.C. section 360bbb-3(b)(1), unless the authorization is terminated or revoked.  Performed at [facility], [address], [place], [place] [address]   Blood culture (routine x 2)     Status: None   Collection Time: 03/04/23 11:22 PM   Specimen: BLOOD  Result Value Ref Range Status   Specimen Description   Final    BLOOD RIGHT ANTECUBITAL Performed at [facility], [address], [place], [place] [address]    Special Requests   Final    BOTTLES DRAWN AEROBIC AND ANAEROBIC Blood Culture adequate volume Performed at [facility] [address] [place], [place] [address]    Culture   Final    NO GROWTH 5 DAYS Performed at [facility], [address], [place], [place] [address]    Report Status 03/10/2023 FINAL  Final   Blood culture (routine x 2)     Status: None   Collection Time: 03/04/23 11:30 PM   Specimen: BLOOD RIGHT ARM  Result Value Ref Range Status   Specimen Description   Final    BLOOD RIGHT ARM Performed at [facility], [address] [place], [place] [address]    Special Requests   Final    BOTTLES DRAWN AEROBIC AND ANAEROBIC Blood Culture results may not be optimal due to an inadequate volume of blood received in culture bottles Performed at [facility], [address], [place], [place] [address]    Culture   Final    NO GROWTH 5 DAYS Performed at [facility], [address] [place], [place] [address]    Report  Status 03/10/2023 FINAL  Final    Labs: CBC: Recent Labs  Lab 03/07/23 1645 03/08/23 0500  WBC 5.8 5.6  NEUTROABS 3.1  --   HGB 15.7* 9.7*  HCT 52.7* 30.6*  MCV 96.3 93.9  PLT 169 140*   Basic Metabolic Panel: Recent Labs  Lab 03/07/23 1645 03/08/23 0414 03/08/23 0615 03/10/23 0551 03/11/23 0749 03/12/23 0610  NA 138  --  138 138 140 133*  K 3.4*  --  3.7 3.7 3.7 3.8  CL 111  --  114* 114* 112* 112*  CO2 20*  --  17* 18* 20* 16*  GLUCOSE 113*  --  77 87 100* 84  BUN 28*  --  24* 19 19 20   CREATININE 1.12* 1.04* 0.88 0.73 0.89 0.93  CALCIUM 9.1  --  7.6* 8.2* 8.9 8.0*   Liver Function Tests: Recent Labs  Lab 03/07/23 1645 03/08/23 0615 03/10/23 0551 03/11/23 0749 03/12/23 0610  AST 38 34 30 39 39  ALT 25 20 20 24 22   ALKPHOS 112 75 79 89 71  BILITOT 1.6* 1.3* 1.9* 1.9* 1.7*  PROT 6.9 4.6* 5.1* 5.8* 5.1*  ALBUMIN 3.1* 2.1* 2.3* 2.7* 2.3*   CBG: Recent Labs  Lab 03/07/23 1558  GLUCAP 108*    Discharge time spent: less than 30 minutes.  Signed: Brendia Sacks, MD [facility] 03/13/2023

## 2023-03-13 NOTE — Progress Notes (Signed)
 Mobility Specialist - Progress Note   03/13/23 1148  Mobility  Activity Ambulated with assistance in hallway  Level of Assistance Minimal assist, patient does 75% or more  Assistive Device Front wheel walker  Distance Ambulated (ft) 120 ft  Range of Motion/Exercises Active  Activity Response Tolerated well  Mobility Referral Yes  Mobility visit 1 Mobility  Mobility Specialist Start Time (ACUTE ONLY) 1130  Mobility Specialist Stop Time (ACUTE ONLY) 1148  Mobility Specialist Time Calculation (min) (ACUTE ONLY) 18 min   Pt was found in bed and agreeable to ambulate. Found soiled and NT assisted with pericare. Grew fatigued with ambulation. At EOS returned to recliner chair with all needs met. Call bell in reach.  [name] Mobility Specialist

## 2023-03-13 NOTE — TOC Transition Note (Signed)
 Transition of Care [facility]) - Discharge Note   Patient Details  Name: [name] MRN: [id number] Date of Birth: 1955/12/16  Transition of Care [facility]) CM/SW Contact:  Otelia Santee, LCSW Phone Number: 03/13/2023, 3:33 PM   Clinical Narrative:    Pt to return home with son w/ HHPT through [facility]. No DME needs identified. Pt's son to provide transportation at discharge. No further TOC needs identified.    Final next level of care: Home w Home Health Services Barriers to Discharge: No Barriers Identified   Patient Goals and CMS Choice Patient states their goals for this hospitalization and ongoing recovery are:: For pt to return home CMS Medicare.gov Compare Post Acute Care list provided to:: Patient Represenative (must comment) Choice offered to / list presented to : Adult Children [employer] ownership interest in [facility].provided to::  (NA)    Discharge Placement                       Discharge Plan and Services Additional resources added to the After Visit Summary for   In-house Referral: Clinical Social Work Discharge Planning Services: NA Post Acute Care Choice: Home Health, Durable Medical Equipment          DME Arranged: N/A DME Agency: NA       HH Arranged: PT HH Agency: [facility] Date HH Agency Contacted: 03/09/23 Time HH Agency Contacted: 1123 Representative spoke with at [facility] Agency: Amy  Social Drivers of Health (SDOH) Interventions SDOH Screenings   Food Insecurity: No Food Insecurity (03/09/2023)  Housing: Low Risk  (03/09/2023)  Transportation Needs: No Transportation Needs (03/09/2023)  Utilities: Not At Risk (03/09/2023)  Depression (PHQ2-9): Medium Risk (03/06/2023)  Social Connections: Socially Isolated (03/09/2023)  Tobacco Use: Medium Risk (03/08/2023)     Readmission Risk Interventions    03/09/2023   11:21 AM 10/19/2022   10:12 AM 08/15/2022    3:20 PM  Readmission Risk Prevention Plan  Transportation Screening  Complete Complete Complete  Medication Review Oceanographer) Complete Complete Complete  PCP or Specialist appointment within 3-5 days of discharge Complete Complete Complete  HRI or Home Care Consult Complete Complete Complete  SW Recovery Care/Counseling Consult Complete Complete Complete  Palliative Care Screening Not Applicable Not Applicable Not Applicable  Skilled Nursing Facility Not Applicable Not Applicable Not Applicable

## 2023-03-14 ENCOUNTER — Telehealth: Payer: Self-pay

## 2023-03-14 NOTE — Telephone Encounter (Signed)
 Copied from CRM [id number]. Topic: Clinical - Home Health Verbal Orders >> Mar 14, 2023 12:56 PM Bo Mcclintock wrote: Caller/Agency: [facility] Callback Number: Tamela Oddi [phone] Service Requested: Physical Therapy Frequency: New Start of Care Date 03/20/23 Any new concerns about the patient? No

## 2023-03-14 NOTE — Transitions of Care (Post Inpatient/ED Visit) (Unsigned)
   03/14/2023  Name: Xzandria Clevinger MRN: [id number] DOB: 29-Aug-1955  Today's TOC FU Call Status: Today's TOC FU Call Status:: Unsuccessful Call (1st Attempt) Unsuccessful Call (1st Attempt) Date: 03/14/23  Attempted to reach the patient regarding the most recent Inpatient/ED visit.  Signature Karena Addison, LPN [facility] Nurse Health Advisor Direct Dial [phone]

## 2023-03-15 NOTE — Transitions of Care (Post Inpatient/ED Visit) (Signed)
 03/15/2023  Name: [name] MRN: [id number] DOB: 1955/09/08  Today's TOC FU Call Status: Today's TOC FU Call Status:: Successful TOC FU Call Completed Unsuccessful Call (1st Attempt) Date: 03/14/23 [facility] FU Call Complete Date: 03/15/23 Patient's Name and Date of Birth confirmed.  Transition Care Management Follow-up Telephone Call Date of Discharge: 03/13/23 Discharge Facility: [facility]) Type of Discharge: Inpatient Admission Primary Inpatient Discharge Diagnosis:: hepatic encephalopathy How have you been since you were released from the hospital?: Better Any questions or concerns?: No  Items Reviewed: Did you receive and understand the discharge instructions provided?: Yes Medications obtained,verified, and reconciled?: Yes (Medications Reviewed) Any new allergies since your discharge?: No Dietary orders reviewed?: Yes Do you have support at home?: Yes People in Home: child(ren), adult  Medications Reviewed Today: Medications Reviewed Today     Reviewed by Karena Addison, LPN (Licensed Practical Nurse) on 03/15/23 at 1451  Med List Status: <None>   Medication Order Taking? Sig Documenting Provider Last Dose Status Informant  albuterol (VENTOLIN HFA) 108 (90 Base) MCG/ACT inhaler [id number] No Inhale 2 puffs into the lungs every 6 (six) hours as needed for shortness of breath or wheezing. [provider] 03/06/2023 Active Child, Pharmacy Records  atorvastatin (LIPITOR) 40 MG tablet [id number] No Take 1 tablet (40 mg total) by mouth in the morning. [name], MD 03/07/2023 Active Child, Pharmacy Records  cyanocobalamin (VITAMIN B12) 1000 MCG tablet [id number] No Take 2,000 mcg by mouth every Friday. [provider] 03/03/2023 Active Pharmacy Records, Child  dicyclomine (BENTYL) 10 MG capsule [id number] No Take 1 capsule (10 mg total) by mouth 3 (three) times daily as needed for spasms (abdominal cramping). Lewie Chamber, MD 03/07/2023 Active Pharmacy  Records, Child  famotidine (PEPCID) 20 MG tablet [id number] No Take 20 mg by mouth in the morning. [provider] 03/07/2023 Active Pharmacy Records, Child  folic acid (FOLVITE) 1 MG tablet [id number] No Take 1 tablet (1 mg total) by mouth daily. Calvert Cantor, MD 03/07/2023 Active Pharmacy Records, Child  furosemide (LASIX) 40 MG tablet [id number] No Take 1 tablet (40 mg total) by mouth daily. Take additional 40 mg if there is a weight gain of 3 lbs in1 day or 5 lbs in 1 week. Marinda Elk, MD 03/07/2023 Active Pharmacy Records, Child  HYDROcodone-acetaminophen [facility]) 10-325 MG tablet [id number] No Take 1 tablet by mouth every 8 (eight) hours as needed for moderate pain (pain score 4-6). [provider] 03/05/2023 Active Pharmacy Records, Child  Barkley Bruns [id number] No Take 1 tablet by mouth daily with breakfast. [provider] 03/07/2023 Active Pharmacy Records, Child  lactulose (CHRONULAC) 10 GM/15ML solution [id number] No Take 30 mLs (20 g total) by mouth 3 (three) times daily. Take to ensure that there is 2 loose bowel movement EVERY DAY  Patient taking differently: Take 30 mLs by mouth 3 (three) times daily as needed (to ensure there are 2 loose bowel movements EVERY DAY).   Rolly Salter, MD Past Week Active Pharmacy Records, Child  levothyroxine (SYNTHROID) 150 MCG tablet [id number] No Take 150 mcg by mouth daily before breakfast. [provider] 03/07/2023 Active Pharmacy Records, Child  lidocaine (LIDODERM) 5 % [id number] No Place 1 patch onto the skin daily. Remove & Discard patch within 12 hours or as directed by MD  Patient taking differently: Place 1 patch onto the skin daily as needed (for pain- Remove & Discard patch within 12 hours or as directed by MD).   Rolly Salter, MD Past  Week Active Pharmacy Records, Child           Med Note Patience Musca   Sat Dec 10, 2022 11:45 AM)    megestrol (MEGACE) 40 MG tablet [id number] No Take 1  tablet (40 mg total) by mouth 2 (two) times daily. Standley Brooking, MD 03/07/2023 Morning Active Child, Pharmacy Records  methocarbamol (ROBAXIN) 500 MG tablet [id number] No Take 500 mg by mouth in the morning. [provider] 03/07/2023 Active Pharmacy Records, Child  midodrine (PROAMATINE) 10 MG tablet [id number] No Take 1 tablet (10 mg total) by mouth 3 (three) times daily with meals. Lewie Chamber, MD 03/07/2023 Active Pharmacy Records, Child  naloxone [facility]) nasal spray 4 mg/0.1 mL [id number] No Place 1 spray into the nose as needed (accidental overdose). [provider] Taking Active Pharmacy Records, Child           Med Note Kandis Cocking [place], [place] A   Wed Mar 08, 2023  2:39 PM)    nystatin cream (MYCOSTATIN) [id number] No Apply topically 2 (two) times daily. Standley Brooking, MD Past Week Active Child, Pharmacy Records  pantoprazole (PROTONIX) 40 MG tablet [id number] No Take 1 tablet (40 mg total) by mouth 2 (two) times daily before a meal. Lewie Chamber, MD 03/07/2023 Active Pharmacy Records, Child  potassium chloride (KLOR-CON) 10 MEQ tablet [id number] No Take 20 mEq by mouth 2 (two) times daily. [provider] 03/07/2023 Active Child, Pharmacy Records  promethazine (PHENERGAN) 12.5 MG tablet [id number] No Take 12.5 mg by mouth every 6 (six) hours as needed for nausea or vomiting. [provider] Past Month Active Pharmacy Records, Child           Med Note Kandis Cocking [place], [place] A   Wed Mar 08, 2023  2:39 PM)    rifaximin (XIFAXAN) 550 MG TABS tablet [id number] No Take 550 mg by mouth 2 (two) times daily. [provider] 03/07/2023 Active Pharmacy Records, Child  rOPINIRole (REQUIP) 0.25 MG tablet [id number] No Take 1 tablet (0.25 mg total) by mouth at bedtime.  Patient taking differently: Take 0.25 mg by mouth daily.   Lewie Chamber, MD 03/06/2023 Active Pharmacy Records, Child  spironolactone (ALDACTONE) 50 MG tablet [id number] No Take 1 tablet  (50 mg total) by mouth daily. Miguel Rota, MD 03/07/2023 Active Child, Pharmacy Records  tamsulosin [facility]) 0.4 MG CAPS capsule [id number] No Take 1 capsule (0.4 mg total) by mouth daily. Lewie Chamber, MD 03/07/2023 Active Pharmacy Records, Child  TOBRADEX ophthalmic ointment [id number] No Place 1 Application into both eyes 2 (two) times daily. [provider] 03/07/2023 Active Child, Pharmacy Records  triamcinolone cream (KENALOG) 0.1 % [id number] No Apply BID to affected areas.  Patient not taking: Reported on 03/08/2023   [name], MD Not Taking Active Child, Pharmacy Records  triamcinolone cream (KENALOG) 0.5 % [id number] No Apply to affected area of right breast twice daily as directed.  Patient taking differently: Apply 1 Application topically 2 (two) times daily as needed (for irritation- right breast).   Osvaldo Shipper, MD Past Week Active Pharmacy Records, Child  venlafaxine XR (EFFEXOR-XR) 75 MG 24 hr capsule [id number] No Take 75 mg by mouth daily with breakfast. [provider] 03/07/2023 Active Pharmacy Records, Child  Vitamin D, Ergocalciferol, (DRISDOL) 50000 UNITS CAPS [id number] No Take 50,000 Units by mouth every Wednesday. [provider] 03/01/2023 Active Pharmacy Records, Child  Med List Note Kandis Cocking Eulis Foster 03/08/23 1450): Pt's son Sunshine Mackowski helps with medications  Home Care and Equipment/Supplies: Were Home Health Services Ordered?: Yes Name of Home Health Agency:: [facility] Has Agency set up a time to come to your home?: Yes First Home Health Visit Date: 03/20/23 Any new equipment or medical supplies ordered?: NA  Functional Questionnaire: Do you need assistance with bathing/showering or dressing?: Yes Do you need assistance with meal preparation?: Yes Do you need assistance with eating?: No Do you have difficulty maintaining continence: No Do you need assistance with getting out of bed/getting out of a  chair/moving?: No Do you have difficulty managing or taking your medications?: No  Follow up appointments reviewed: PCP Follow-up appointment confirmed?: No (declined appt) MD Provider Line Number:[phone] Given: No Specialist Hospital Follow-up appointment confirmed?: NA Do you need transportation to your follow-up appointment?: No Do you understand care options if your condition(s) worsen?: Yes-patient verbalized understanding    SIGNATURE Karena Addison, LPN [facility] Nurse Health Advisor Direct Dial [phone]

## 2023-03-20 ENCOUNTER — Encounter: Payer: Self-pay | Admitting: Family Medicine

## 2023-03-20 ENCOUNTER — Other Ambulatory Visit (HOSPITAL_COMMUNITY): Payer: Self-pay

## 2023-03-20 ENCOUNTER — Other Ambulatory Visit: Payer: Self-pay

## 2023-03-20 ENCOUNTER — Telehealth: Payer: Self-pay

## 2023-03-20 DIAGNOSIS — R600 Localized edema: Secondary | ICD-10-CM

## 2023-03-20 MED ORDER — SPIRONOLACTONE 50 MG PO TABS: 50.0000 mg | ORAL_TABLET | Freq: Every day | ORAL | 1 refills | Status: DC

## 2023-03-20 NOTE — Telephone Encounter (Signed)
 Copied from CRM [id number]. Topic: Clinical - Home Health Verbal Orders >> Mar 20, 2023  8:59 AM Everette C wrote: Tamela Oddi with [facility] has called for an update on the status of home health orders submitted on 03/14/23 requesting a new start of care date for 03/20/23  Please contact further when possible at [phone]

## 2023-03-21 ENCOUNTER — Telehealth: Payer: Self-pay

## 2023-03-21 NOTE — Telephone Encounter (Signed)
 Copied from CRM [id number]. Topic: Clinical - Home Health Verbal Orders >> Mar 20, 2023  3:18 PM Clide Dales wrote: Caller/Agency: [facility] Callback Number: [phone] Service Requested: New start of care date for 3/12

## 2023-03-22 ENCOUNTER — Telehealth: Payer: Self-pay

## 2023-03-22 NOTE — Telephone Encounter (Signed)
 Copied from CRM [id number]. Topic: Clinical - Home Health Verbal Orders >> Mar 22, 2023  3:37 PM Geroge Baseman wrote: Caller/Agency: [facility]/ Cathie Beams Number: [phone] Service Requested: Physical Therapy Frequency: N/A Any new concerns about the patient? Yes, reporting she made several attempts to see this patient, Monday pts son said that he needed to reschedule, attempts were made multiple times to reschedule but he never returned calls texts to reschedule.

## 2023-03-29 ENCOUNTER — Other Ambulatory Visit: Payer: Self-pay

## 2023-03-29 ENCOUNTER — Observation Stay (HOSPITAL_COMMUNITY)
Admission: EM | Admit: 2023-03-29 | Discharge: 2023-04-02 | Disposition: A | Discharge status: Home / Self Care | Payer: Medicare (Managed Care) | Attending: Family Medicine | Admitting: Family Medicine

## 2023-03-29 DIAGNOSIS — K7460 Unspecified cirrhosis of liver: Secondary | ICD-10-CM | POA: Diagnosis present

## 2023-03-29 DIAGNOSIS — E7220 Disorder of urea cycle metabolism, unspecified: Secondary | ICD-10-CM | POA: Diagnosis not present

## 2023-03-29 DIAGNOSIS — N179 Acute kidney failure, unspecified: Secondary | ICD-10-CM | POA: Diagnosis not present

## 2023-03-29 DIAGNOSIS — K90829 Short bowel syndrome, unspecified: Secondary | ICD-10-CM | POA: Diagnosis not present

## 2023-03-29 DIAGNOSIS — R2681 Unsteadiness on feet: Secondary | ICD-10-CM | POA: Insufficient documentation

## 2023-03-29 DIAGNOSIS — G8929 Other chronic pain: Secondary | ICD-10-CM | POA: Insufficient documentation

## 2023-03-29 DIAGNOSIS — E785 Hyperlipidemia, unspecified: Secondary | ICD-10-CM | POA: Diagnosis not present

## 2023-03-29 DIAGNOSIS — Z79899 Other long term (current) drug therapy: Secondary | ICD-10-CM | POA: Insufficient documentation

## 2023-03-29 DIAGNOSIS — G2581 Restless legs syndrome: Secondary | ICD-10-CM | POA: Diagnosis not present

## 2023-03-29 DIAGNOSIS — Z87891 Personal history of nicotine dependence: Secondary | ICD-10-CM | POA: Diagnosis not present

## 2023-03-29 DIAGNOSIS — Z932 Ileostomy status: Secondary | ICD-10-CM | POA: Diagnosis not present

## 2023-03-29 DIAGNOSIS — F32A Depression, unspecified: Secondary | ICD-10-CM | POA: Insufficient documentation

## 2023-03-29 DIAGNOSIS — H2702 Aphakia, left eye: Secondary | ICD-10-CM | POA: Insufficient documentation

## 2023-03-29 DIAGNOSIS — M549 Dorsalgia, unspecified: Secondary | ICD-10-CM | POA: Diagnosis not present

## 2023-03-29 DIAGNOSIS — F341 Dysthymic disorder: Secondary | ICD-10-CM | POA: Diagnosis not present

## 2023-03-29 DIAGNOSIS — D509 Iron deficiency anemia, unspecified: Secondary | ICD-10-CM | POA: Diagnosis not present

## 2023-03-29 DIAGNOSIS — D696 Thrombocytopenia, unspecified: Secondary | ICD-10-CM | POA: Diagnosis present

## 2023-03-29 DIAGNOSIS — K7682 Hepatic encephalopathy: Secondary | ICD-10-CM | POA: Diagnosis not present

## 2023-03-29 DIAGNOSIS — E039 Hypothyroidism, unspecified: Secondary | ICD-10-CM | POA: Diagnosis not present

## 2023-03-29 DIAGNOSIS — K219 Gastro-esophageal reflux disease without esophagitis: Secondary | ICD-10-CM | POA: Diagnosis not present

## 2023-03-29 DIAGNOSIS — E610 Copper deficiency: Secondary | ICD-10-CM | POA: Insufficient documentation

## 2023-03-29 DIAGNOSIS — Z86718 Personal history of other venous thrombosis and embolism: Secondary | ICD-10-CM | POA: Diagnosis not present

## 2023-03-29 DIAGNOSIS — R4182 Altered mental status, unspecified: Secondary | ICD-10-CM | POA: Diagnosis present

## 2023-03-29 DIAGNOSIS — Z86711 Personal history of pulmonary embolism: Secondary | ICD-10-CM | POA: Insufficient documentation

## 2023-03-29 DIAGNOSIS — D689 Coagulation defect, unspecified: Secondary | ICD-10-CM | POA: Insufficient documentation

## 2023-03-29 DIAGNOSIS — Z961 Presence of intraocular lens: Secondary | ICD-10-CM

## 2023-03-29 LAB — CBC WITH DIFFERENTIAL/PLATELET
Abs Immature Granulocytes: 0.01 10*3/uL (ref 0.00–0.07)
Basophils Absolute: 0.1 10*3/uL (ref 0.0–0.1)
Basophils Relative: 1 %
Eosinophils Absolute: 0.5 10*3/uL (ref 0.0–0.5)
Eosinophils Relative: 9 %
HCT: 35.7 % — ABNORMAL LOW (ref 36.0–46.0)
Hemoglobin: 11 g/dL — ABNORMAL LOW (ref 12.0–15.0)
Immature Granulocytes: 0 %
Lymphocytes Relative: 28 %
Lymphs Abs: 1.7 10*3/uL (ref 0.7–4.0)
MCH: 29.3 pg (ref 26.0–34.0)
MCHC: 30.8 g/dL (ref 30.0–36.0)
MCV: 94.9 fL (ref 80.0–100.0)
Monocytes Absolute: 0.5 10*3/uL (ref 0.1–1.0)
Monocytes Relative: 8 %
Neutro Abs: 3.4 10*3/uL (ref 1.7–7.7)
Neutrophils Relative %: 54 %
Platelets: 153 10*3/uL (ref 150–400)
RBC: 3.76 MIL/uL — ABNORMAL LOW (ref 3.87–5.11)
RDW: 16.8 % — ABNORMAL HIGH (ref 11.5–15.5)
WBC: 6.3 10*3/uL (ref 4.0–10.5)
nRBC: 0 % (ref 0.0–0.2)

## 2023-03-29 LAB — URINALYSIS, ROUTINE W REFLEX MICROSCOPIC
Bilirubin Urine: NEGATIVE
Glucose, UA: NEGATIVE mg/dL
Hgb urine dipstick: NEGATIVE
Ketones, ur: NEGATIVE mg/dL
Leukocytes,Ua: NEGATIVE
Nitrite: NEGATIVE
Protein, ur: NEGATIVE mg/dL
Specific Gravity, Urine: 1.025 (ref 1.005–1.030)
pH: 5 (ref 5.0–8.0)

## 2023-03-29 LAB — COMPREHENSIVE METABOLIC PANEL
ALT: 25 U/L (ref 0–44)
AST: 37 U/L (ref 15–41)
Albumin: 2.8 g/dL — ABNORMAL LOW (ref 3.5–5.0)
Alkaline Phosphatase: 96 U/L (ref 38–126)
Anion gap: 7 (ref 5–15)
BUN: 21 mg/dL (ref 8–23)
CO2: 20 mmol/L — ABNORMAL LOW (ref 22–32)
Calcium: 8.8 mg/dL — ABNORMAL LOW (ref 8.9–10.3)
Chloride: 113 mmol/L — ABNORMAL HIGH (ref 98–111)
Creatinine, Ser: 1.17 mg/dL — ABNORMAL HIGH (ref 0.44–1.00)
GFR, Estimated: 52 mL/min — ABNORMAL LOW (ref >60)
Glucose, Bld: 133 mg/dL — ABNORMAL HIGH (ref 70–99)
Potassium: 4 mmol/L (ref 3.5–5.1)
Sodium: 140 mmol/L (ref 135–145)
Total Bilirubin: 1.7 mg/dL — ABNORMAL HIGH (ref 0.0–1.2)
Total Protein: 6.3 g/dL — ABNORMAL LOW (ref 6.5–8.1)

## 2023-03-29 LAB — AMMONIA: Ammonia: 183 umol/L — ABNORMAL HIGH (ref 9–35)

## 2023-03-29 LAB — LIPASE, BLOOD: Lipase: 48 U/L (ref 11–51)

## 2023-03-29 MED ORDER — LACTULOSE 10 GM/15ML PO SOLN
30.0000 g | Freq: Once | ORAL | Status: AC
Start: 2023-03-29 — End: 2023-03-29
  Administered 2023-03-29: 30 g via ORAL
  Filled 2023-03-29: qty 60

## 2023-03-29 NOTE — ED Provider Notes (Signed)
 [place] EMERGENCY DEPARTMENT AT [facility] Provider Note   CSN: [id number] Arrival date & time: 03/29/23  1622     History {Add pertinent medical, surgical, social history, OB history to HPI:1} Chief Complaint  Patient presents with   Altered Mental Status    [name] is a 68 y.o. female.  The history is provided by the patient and medical records.  Altered Mental Status  68 year old female with history of hypertension, NASH cirrhosis, thyroid disease, history of short gut, presenting to the ED with some mild confusion.  Son reports she started to seem a little off yesterday, sleeping a little more than usual and was having periods where she was not making sense but these were brief and then she would return to baseline.  Today they were out running errands and she kept dozing off in the car.  He thought maybe her ammonia was elevated so gave her extra lactulose but she has not yet had bowel movement today.  She has been compliant with her home medications.  She has not had any fevers.  No chest pain or shortness of breath.  She does report some increased itching today, son states this is normal with her ammonia is elevated.  Home Medications Prior to Admission medications   Medication Sig Start Date End Date Taking? Authorizing Provider  albuterol (VENTOLIN HFA) 108 (90 Base) MCG/ACT inhaler Inhale 2 puffs into the lungs every 6 (six) hours as needed for shortness of breath or wheezing. 02/13/23   [provider]  atorvastatin (LIPITOR) 40 MG tablet Take 1 tablet (40 mg total) by mouth in the morning. 02/22/23   [name], MD  cyanocobalamin (VITAMIN B12) 1000 MCG tablet Take 2,000 mcg by mouth every Friday.    [provider]  dicyclomine (BENTYL) 10 MG capsule Take 1 capsule (10 mg total) by mouth 3 (three) times daily as needed for spasms (abdominal cramping). 09/09/22   Lewie Chamber, MD  famotidine (PEPCID) 20 MG tablet Take 20 mg by mouth  in the morning.    [provider]  folic acid (FOLVITE) 1 MG tablet Take 1 tablet (1 mg total) by mouth daily. 06/16/22   Calvert Cantor, MD  furosemide (LASIX) 40 MG tablet Take 1 tablet (40 mg total) by mouth daily. Take additional 40 mg if there is a weight gain of 3 lbs in1 day or 5 lbs in 1 week. 12/17/22   Marinda Elk, MD  HYDROcodone-acetaminophen (NORCO) 10-325 MG tablet Take 1 tablet by mouth every 8 (eight) hours as needed for moderate pain (pain score 4-6). 12/19/22   [provider]  IRON-VITAMIN C PO Take 1 tablet by mouth daily with breakfast.    [provider]  lactulose (CHRONULAC) 10 GM/15ML solution Take 30 mLs (20 g total) by mouth 3 (three) times daily. Take to ensure that there is 2 loose bowel movement EVERY DAY Patient taking differently: Take 30 mLs by mouth 3 (three) times daily as needed (to ensure there are 2 loose bowel movements EVERY DAY). 10/31/22   Rolly Salter, MD  levothyroxine (SYNTHROID) 150 MCG tablet Take 150 mcg by mouth daily before breakfast. 10/26/21   [provider]  lidocaine (LIDODERM) 5 % Place 1 patch onto the skin daily. Remove & Discard patch within 12 hours or as directed by MD Patient taking differently: Place 1 patch onto the skin daily as needed (for pain- Remove & Discard patch within 12 hours or as directed by MD).  10/31/22   Rolly Salter, MD  megestrol (MEGACE) 40 MG tablet Take 1 tablet (40 mg total) by mouth 2 (two) times daily. 01/13/23   Standley Brooking, MD  methocarbamol (ROBAXIN) 500 MG tablet Take 500 mg by mouth in the morning. 03/04/22   [provider]  midodrine (PROAMATINE) 10 MG tablet Take 1 tablet (10 mg total) by mouth 3 (three) times daily with meals. 09/09/22   Lewie Chamber, MD  naloxone [facility]) nasal spray 4 mg/0.1 mL Place 1 spray into the nose as needed (accidental overdose). 03/25/22   [provider]  nystatin cream (MYCOSTATIN) Apply topically 2 (two) times  daily. 01/13/23   Standley Brooking, MD  pantoprazole (PROTONIX) 40 MG tablet Take 1 tablet (40 mg total) by mouth 2 (two) times daily before a meal. 09/09/22   Lewie Chamber, MD  potassium chloride (KLOR-CON) 10 MEQ tablet Take 20 mEq by mouth 2 (two) times daily. 02/22/23   [provider]  promethazine (PHENERGAN) 12.5 MG tablet Take 12.5 mg by mouth every 6 (six) hours as needed for nausea or vomiting.    [provider]  rifaximin (XIFAXAN) 550 MG TABS tablet Take 550 mg by mouth 2 (two) times daily.    [provider]  rOPINIRole (REQUIP) 0.25 MG tablet Take 1 tablet (0.25 mg total) by mouth at bedtime. Patient taking differently: Take 0.25 mg by mouth daily. 09/09/22   Lewie Chamber, MD  spironolactone (ALDACTONE) 50 MG tablet Take 1 tablet (50 mg total) by mouth daily. 03/20/23   [name], MD  tamsulosin (FLOMAX) 0.4 MG CAPS capsule Take 1 capsule (0.4 mg total) by mouth daily. 09/10/22   Lewie Chamber, MD  TOBRADEX ophthalmic ointment Place 1 Application into both eyes 2 (two) times daily. 02/24/23   [provider]  triamcinolone cream (KENALOG) 0.1 % Apply BID to affected areas. Patient not taking: Reported on 03/08/2023 03/06/23   [name], MD  triamcinolone cream (KENALOG) 0.5 % Apply to affected area of right breast twice daily as directed. Patient taking differently: Apply 1 Application topically 2 (two) times daily as needed (for irritation- right breast). 05/10/22   Osvaldo Shipper, MD  venlafaxine XR (EFFEXOR-XR) 75 MG 24 hr capsule Take 75 mg by mouth daily with breakfast. 08/17/21   [provider]  Vitamin D, Ergocalciferol, (DRISDOL) 50000 UNITS CAPS Take 50,000 Units by mouth every Wednesday.    [provider]      Allergies    Ace inhibitors, Ivp dye [iodinated contrast media], Desitin [zinc oxide], Gadolinium derivatives, Hydroxyzine, Chlorhexidine, Doxycycline, Penicillins, and Zofran [ondansetron]    Review of  Systems   Review of Systems  Neurological:        Mild confusion  All other systems reviewed and are negative.   Physical Exam Updated Vital Signs BP (!) 142/56 (BP Location: Right Arm)   Pulse 78   Temp 98.7 F (37.1 C) (Oral)   Resp 17   Ht 5\' 2"  (1.575 m)   Wt 94.6 kg   SpO2 99%   BMI 38.15 kg/m   Physical Exam Vitals and nursing note reviewed.  Constitutional:      Appearance: She is well-developed.  HENT:     Head: Normocephalic and atraumatic.  Eyes:     Conjunctiva/sclera: Conjunctivae normal.     Pupils: Pupils are equal, round, and reactive to light.     Comments: Left eye opacified (chronic)  Cardiovascular:     Rate and Rhythm: Normal  rate and regular rhythm.     Heart sounds: Normal heart sounds.  Pulmonary:     Effort: Pulmonary effort is normal.     Breath sounds: Normal breath sounds.  Abdominal:     General: Bowel sounds are normal.     Palpations: Abdomen is soft.  Musculoskeletal:        General: Normal range of motion.     Cervical back: Normal range of motion.  Skin:    General: Skin is warm and dry.     Comments: Scratching during exam but no noted rashes  Neurological:     Mental Status: She is alert.     Comments: Awake, alert, able to answer questions and follow simple commands     ED Results / Procedures / Treatments   Labs (all labs ordered are listed, but only abnormal results are displayed) Labs Reviewed  CBC WITH DIFFERENTIAL/PLATELET - Abnormal; Notable for the following components:      Result Value   RBC 3.76 (*)    Hemoglobin 11.0 (*)    HCT 35.7 (*)    RDW 16.8 (*)    All other components within normal limits  COMPREHENSIVE METABOLIC PANEL - Abnormal; Notable for the following components:   Chloride 113 (*)    CO2 20 (*)    Glucose, Bld 133 (*)    Creatinine, Ser 1.17 (*)    Calcium 8.8 (*)    Total Protein 6.3 (*)    Albumin 2.8 (*)    Total Bilirubin 1.7 (*)    GFR, Estimated 52 (*)    All other components  within normal limits  AMMONIA - Abnormal; Notable for the following components:   Ammonia 183 (*)    All other components within normal limits  LIPASE, BLOOD  URINALYSIS, ROUTINE W REFLEX MICROSCOPIC    EKG None  Radiology No results found.  Procedures Procedures  {Document cardiac monitor, telemetry assessment procedure when appropriate:1}  Medications Ordered in ED Medications  lactulose (CHRONULAC) 10 GM/15ML solution 30 g (has no administration in time range)    ED Course/ Medical Decision Making/ A&P   {   Click here for ABCD2, HEART and other calculatorsREFRESH Note before signing :1}                              Medical Decision Making Amount and/or Complexity of Data Reviewed Labs: ordered. ECG/medicine tests: ordered and independent interpretation performed.  Risk Prescription drug management. Decision regarding hospitalization.   68 year old female presenting to the ED with son for some mild confusion.  History of cirrhosis, tends to get this way when her ammonia is elevated.  Son tried giving her extra dose today but has not yet had a bowel movement.  She is afebrile and nontoxic.  She is awake and alert, able to answer simple questions and follow commands.  She is able to tolerate oral fluids currently.  Labs obtained from triage--she has no leukocytosis.  No significant electrolyte derangement.  LFTs appear at baseline.  Bili is 1.7 which also appears baseline when compared with prior.  Ammonia today is 183.  Suspect this is likely etiology of her confusion.  She is given extra dose of lactulose.  She will require admission. Final Clinical Impression(s) / ED Diagnoses Final diagnoses:  None    Rx / DC Orders ED Discharge Orders     None

## 2023-03-29 NOTE — ED Triage Notes (Signed)
 Pt son reports pt is having increased confusion. Has hx of cirrhosis of the liver and has had had uti to cause these symptoms.

## 2023-03-29 NOTE — H&P (Signed)
 History and Physical    [name] WUJ:[id number] DOB: [birth year] DOA: 03/29/2023  Patient coming from: Home.  Chief Complaint: Increasing confusion.  HPI: [name] is a 68 y.o. female with history of liver cirrhosis secondary to [facility], hypothyroidism, hyperlipidemia, chronic back pain, GERD, anemia was brought to the ER after patient was found to be increasingly confused since this morning.  Patient's son states that patient has been using the lactulose only twice a day instead of 3 times because of increasing bowel movement.  Denies any fever chills nausea vomiting diarrhea or any new medications.  Patient was recently admitted on 03/08/2023 through 03/13/2023 for hepatic encephalopathy.  Patient's son states that patient has been following with gastroenterologist at [facility] and found her copper has been at lower levels and has scheduled test to check copper excretion.  ED Course: In the ER patient is afebrile appears nonfocal.  Labs show ammonia level of 183 and patient appears confused creatinine mildly elevated from baseline of 0.9 L are 1.1.  Patient was given lactulose admitted for further observation.  Review of Systems: As per HPI, rest all negative.   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   Obesity    Pulmonary emboli (HCC) 01/10/2009   Pulmonary embolism (HCC)    Short gut syndrome    Thyroid disease     Past Surgical History:  Procedure Laterality Date   ABDOMINAL WALL MESH  REMOVAL     2016   CESAREAN  SECTION WITH BILATERAL TUBAL LIGATION  04/30/1985   CHOLECYSTECTOMY  05/2007   Exploratory laparotomy, lysis of adhesions, takedown of ileostomy with small bowel resection, open cholecystectomy and ileocolostomy   COLON SURGERY  01/2007   exploratory laparotomy with right colecotmy and end ileostomy   COLONOSCOPY  04/02/2019   Dr Clifton James Elie Confer, MD [facility]   ESOPHAGOGASTRODUODENOSCOPY (EGD) WITH PROPOFOL N/A 10/19/2022   Procedure: ESOPHAGOGASTRODUODENOSCOPY (EGD) WITH PROPOFOL;  Surgeon: Kerin Salen, MD;  Location: [facility];  Service: Gastroenterology;  Laterality: N/A;   ESOPHAGOSCOPY  04/02/2019   EYE SURGERY Left 10/06/2020   Cataract extraciton w/intraocular lens implant- Dr Valere Dross   HERNIA REPAIR  10/2007   ILEOSTOMY  2009   states bowel perfotation wih colonsocopy   ILEOSTOMY CLOSURE  2009   INCISIONAL HERNIA REPAIR  10/11/2018   IR KYPHO THORACIC WITH BONE BIOPSY  08/31/2022   IR KYPHO THORACIC WITH BONE BIOPSY  10/27/2022   IR RADIOLOGIST EVAL & MGMT  09/27/2022   KIDNEY STONE SURGERY     LIVER RESECTION     2009   PANNICULECTOMY     repair of abdominal wall hernia   WRIST SURGERY     tendonitis     reports that she quit smoking about 38 years ago. Her smoking use included cigarettes. She has been exposed to tobacco smoke. She has never used smokeless tobacco. She reports that she does not drink alcohol and does not use drugs.  Allergies  Allergen Reactions  Ace Inhibitors Anaphylaxis and Swelling   Ivp Dye [Iodinated Contrast Media] Anaphylaxis   Desitin [Zinc Oxide] Rash and Other (See Comments)    Worsens rash   Gadolinium Derivatives Other (See Comments)    Reaction??   Hydroxyzine Other (See Comments)    Affects the mind   Chlorhexidine Rash and Other (See Comments)    WORSENS RASHES   Doxycycline Rash   Penicillins Rash   Zofran [Ondansetron] Rash    Family History  Problem Relation Age of Onset   Uterine cancer Mother    Bone cancer Father     Heart disease Father    Hyperlipidemia Father    Dementia Father    Hypertension Father    Skin cancer Sister    Hypertension Sister    Diabetes Sister    Hyperlipidemia Brother    Hypertension Brother    Heart disease Brother     Prior to Admission medications   Medication Sig Start Date End Date Taking? Authorizing Provider  albuterol (VENTOLIN HFA) 108 (90 Base) MCG/ACT inhaler Inhale 2 puffs into the lungs every 6 (six) hours as needed for shortness of breath or wheezing. 02/13/23  Yes [provider]  atorvastatin (LIPITOR) 40 MG tablet Take 1 tablet (40 mg total) by mouth in the morning. 02/22/23  Yes [name], MD  copper tablet Take 2 mg by mouth in the morning and at bedtime.   Yes [provider]  cyanocobalamin (VITAMIN B12) 1000 MCG tablet Take 2,000 mcg by mouth every Friday.   Yes [provider]  dicyclomine (BENTYL) 10 MG capsule Take 1 capsule (10 mg total) by mouth 3 (three) times daily as needed for spasms (abdominal cramping). 09/09/22  Yes Lewie Chamber, MD  famotidine (PEPCID) 20 MG tablet Take 20 mg by mouth in the morning.   Yes [provider]  folic acid (FOLVITE) 1 MG tablet Take 1 tablet (1 mg total) by mouth daily. 06/16/22  Yes Calvert Cantor, MD  furosemide (LASIX) 40 MG tablet Take 1 tablet (40 mg total) by mouth daily. Take additional 40 mg if there is a weight gain of 3 lbs in1 day or 5 lbs in 1 week. 12/17/22  Yes Marinda Elk, MD  HYDROcodone-acetaminophen [facility]) 10-325 MG tablet Take 1 tablet by mouth every 8 (eight) hours as needed for moderate pain (pain score 4-6). 12/19/22  Yes [provider]  IRON-VITAMIN C PO Take 1 tablet by mouth daily with breakfast.   Yes [provider]  lactulose (CHRONULAC) 10 GM/15ML solution Take 30 mLs (20 g total) by mouth 3 (three) times daily. Take to ensure that there is 2 loose bowel movement EVERY DAY Patient taking differently: Take 30 mLs by mouth 3  (three) times daily as needed (to ensure there are 2 loose bowel movements EVERY DAY). 10/31/22  Yes Rolly Salter, MD  levothyroxine (SYNTHROID) 150 MCG tablet Take 150 mcg by mouth daily before breakfast. 10/26/21  Yes [provider]  lidocaine (LIDODERM) 5 % Place 1 patch onto the skin daily. Remove & Discard patch within 12 hours or as directed by MD Patient taking differently: Place 1 patch onto the skin daily as needed (for pain- Remove & Discard patch within 12 hours or as directed by MD). 10/31/22  Yes Rolly Salter, MD  magnesium gluconate (MAGONATE) 500 (27 Mg) MG TABS tablet Take 500 mg by mouth in the morning and at bedtime.   Yes [provider]  megestrol (MEGACE) 40  MG tablet Take 1 tablet (40 mg total) by mouth 2 (two) times daily. 01/13/23  Yes Standley Brooking, MD  methocarbamol (ROBAXIN) 500 MG tablet Take 500 mg by mouth in the morning. 03/04/22  Yes [provider]  midodrine (PROAMATINE) 10 MG tablet Take 1 tablet (10 mg total) by mouth 3 (three) times daily with meals. 09/09/22  Yes Lewie Chamber, MD  Multiple Vitamins-Minerals (CENTRUM SILVER ULTRA WOMENS PO) Take 1 tablet by mouth in the morning.   Yes [provider]  naloxone (NARCAN) nasal spray 4 mg/0.1 mL Place 1 spray into the nose as needed (accidental overdose). 03/25/22  Yes [provider]  nystatin cream (MYCOSTATIN) Apply topically 2 (two) times daily. 01/13/23  Yes Standley Brooking, MD  pantoprazole (PROTONIX) 40 MG tablet Take 1 tablet (40 mg total) by mouth 2 (two) times daily before a meal. 09/09/22  Yes Lewie Chamber, MD  potassium chloride (KLOR-CON) 10 MEQ tablet Take 20 mEq by mouth 2 (two) times daily. 02/22/23  Yes [provider]  promethazine (PHENERGAN) 12.5 MG tablet Take 12.5 mg by mouth every 6 (six) hours as needed for nausea or vomiting.   Yes [provider]  rifaximin (XIFAXAN) 550 MG TABS tablet Take 550 mg by mouth 2 (two) times  daily.   Yes [provider]  rOPINIRole (REQUIP) 0.25 MG tablet Take 1 tablet (0.25 mg total) by mouth at bedtime. Patient taking differently: Take 0.25 mg by mouth daily. 09/09/22  Yes Lewie Chamber, MD  spironolactone (ALDACTONE) 50 MG tablet Take 1 tablet (50 mg total) by mouth daily. 03/20/23  Yes [name], MD  tamsulosin (FLOMAX) 0.4 MG CAPS capsule Take 1 capsule (0.4 mg total) by mouth daily. 09/10/22  Yes Lewie Chamber, MD  triamcinolone cream (KENALOG) 0.5 % Apply to affected area of right breast twice daily as directed. Patient taking differently: Apply 1 Application topically 2 (two) times daily as needed (for irritation- right breast). 05/10/22  Yes Osvaldo Shipper, MD  venlafaxine XR (EFFEXOR-XR) 75 MG 24 hr capsule Take 75 mg by mouth daily with breakfast. 08/17/21  Yes [provider]  TOBRADEX ophthalmic ointment Place 1 Application into both eyes 2 (two) times daily. Patient not taking: Reported on 03/29/2023 02/24/23   [provider]  Vitamin D, Ergocalciferol, (DRISDOL) 50000 UNITS CAPS Take 50,000 Units by mouth every Wednesday.    [provider]    Physical Exam: Constitutional: Moderately built and nourished. Vitals:   03/29/23 1645 03/29/23 1647 03/29/23 2050 03/29/23 2249  BP: 133/72  (!) 152/108 (!) 142/56  Pulse: 80  71 78  Resp: 18  17 17   Temp: 98.7 F (37.1 C)  97.8 F (36.6 C) 98.7 F (37.1 C)  TempSrc: Oral  Oral Oral  SpO2: 100%  100% 99%  Weight:  94.6 kg    Height:  5\' 2"  (1.575 m)     Eyes: Anicteric no pallor. ENMT: No discharge from the ears eyes nose or mouth. Neck: No mass felt.  No neck rigidity. Respiratory: No rhonchi or crepitations. Cardiovascular: S1-S2 heard. Abdomen: Soft nontender bowel sound present. Musculoskeletal: Has pain on moving left knee which is chronic. Skin: No rash. Neurologic: Alert awake oriented to name and place but is slow in thinking.  Moving all extremities. Psychiatric:  Alert and awake but slow in thinking.   Labs on Admission: I have personally reviewed following labs and imaging studies  CBC: Recent Labs  Lab 03/29/23 1628  WBC 6.3  NEUTROABS 3.4  HGB 11.0*  HCT 35.7*  MCV 94.9  PLT 153   Basic Metabolic Panel: Recent Labs  Lab 03/29/23 1628  NA 140  K 4.0  CL 113*  CO2 20*  GLUCOSE 133*  BUN 21  CREATININE 1.17*  CALCIUM 8.8*   GFR: Estimated Creatinine Clearance: 51.4 mL/min (A) (by C-G formula based on SCr of 1.17 mg/dL (H)). Liver Function Tests: Recent Labs  Lab 03/29/23 1628  AST 37  ALT 25  ALKPHOS 96  BILITOT 1.7*  PROT 6.3*  ALBUMIN 2.8*   Recent Labs  Lab 03/29/23 1628  LIPASE 48   Recent Labs  Lab 03/29/23 1628  AMMONIA 183*   Coagulation Profile: No results for input(s): "INR", "PROTIME" in the last 168 hours. Cardiac Enzymes: No results for input(s): "CKTOTAL", "CKMB", "CKMBINDEX", "TROPONINI" in the last 168 hours. BNP (last 3 results) No results for input(s): "PROBNP" in the last 8760 hours. HbA1C: No results for input(s): "HGBA1C" in the last 72 hours. CBG: No results for input(s): "GLUCAP" in the last 168 hours. Lipid Profile: No results for input(s): "CHOL", "HDL", "LDLCALC", "TRIG", "CHOLHDL", "LDLDIRECT" in the last 72 hours. Thyroid Function Tests: No results for input(s): "TSH", "T4TOTAL", "FREET4", "T3FREE", "THYROIDAB" in the last 72 hours. Anemia Panel: No results for input(s): "VITAMINB12", "FOLATE", "FERRITIN", "TIBC", "IRON", "RETICCTPCT" in the last 72 hours. Urine analysis:    Component Value Date/Time   COLORURINE YELLOW 03/29/2023 1651   APPEARANCEUR HAZY (A) 03/29/2023 1651   LABSPEC 1.025 03/29/2023 1651   PHURINE 5.0 03/29/2023 1651   GLUCOSEU NEGATIVE 03/29/2023 1651   HGBUR NEGATIVE 03/29/2023 1651   BILIRUBINUR NEGATIVE 03/29/2023 1651   KETONESUR NEGATIVE 03/29/2023 1651   PROTEINUR NEGATIVE 03/29/2023 1651   UROBILINOGEN 0.2 08/21/2013 0017   NITRITE NEGATIVE  03/29/2023 1651   LEUKOCYTESUR NEGATIVE 03/29/2023 1651   Sepsis Labs: @LABRCNTIP (procalcitonin:4,lacticidven:4) )No results found for this or any previous visit (from the past 240 hours).   Radiological Exams on Admission: No results found.   Assessment/Plan Principal Problem:   Hepatic encephalopathy (HCC) Active Problems:   Cirrhosis due to NASH   GERD (gastroesophageal reflux disease)    Acute hepatic encephalopathy with known history of liver cirrhosis secondary to NASH.  Patient is afebrile and does not show any signs of any infection.  UA is unremarkable.  Creatinine mildly elevated from baseline.  Will dose lactulose 30 g p.o. 3 times daily and continue Xifaxan.  Closely monitor mental status.  Check PT/INR to calculate MELD score. Acute renal failure creatinine mildly increased from baseline.  Will hold spironolactone and Lasix for now.  Will give 1 dose of albumin and once creatinine improves restart diuretics. Hypothyroidism on Synthroid last TSH measured was 2.9 two months ago.  Check TSH. Chronic anemia gets IV iron from hematologist.  Follow CBC. Hyperlipidemia on statins. GERD on PPI. Restless leg syndrome on Requip. Depression on Effexor. Chronic back pain and joint pains.  Hydrocodone has not taken it for more than a week.  Since patient has hepatic encephalopathy will need further management and more than 2 midnight stay.   DVT prophylaxis: Heparin. Code Status: DNR. Family Communication: Patient's son. Disposition Plan: Medical floor. Consults called: None. Admission status: Observation.

## 2023-03-30 DIAGNOSIS — F32A Depression, unspecified: Secondary | ICD-10-CM | POA: Insufficient documentation

## 2023-03-30 DIAGNOSIS — N179 Acute kidney failure, unspecified: Secondary | ICD-10-CM | POA: Insufficient documentation

## 2023-03-30 DIAGNOSIS — G2581 Restless legs syndrome: Secondary | ICD-10-CM | POA: Insufficient documentation

## 2023-03-30 DIAGNOSIS — K7682 Hepatic encephalopathy: Secondary | ICD-10-CM | POA: Diagnosis not present

## 2023-03-30 LAB — BASIC METABOLIC PANEL
Anion gap: 5 (ref 5–15)
BUN: 22 mg/dL (ref 8–23)
CO2: 20 mmol/L — ABNORMAL LOW (ref 22–32)
Calcium: 8.4 mg/dL — ABNORMAL LOW (ref 8.9–10.3)
Chloride: 112 mmol/L — ABNORMAL HIGH (ref 98–111)
Creatinine, Ser: 1.07 mg/dL — ABNORMAL HIGH (ref 0.44–1.00)
GFR, Estimated: 58 mL/min — ABNORMAL LOW (ref >60)
Glucose, Bld: 114 mg/dL — ABNORMAL HIGH (ref 70–99)
Potassium: 3.5 mmol/L (ref 3.5–5.1)
Sodium: 137 mmol/L (ref 135–145)

## 2023-03-30 LAB — CBC
HCT: 33.1 % — ABNORMAL LOW (ref 36.0–46.0)
HCT: 29.1 % — ABNORMAL LOW (ref 36.0–46.0)
Hemoglobin: 10.3 g/dL — ABNORMAL LOW (ref 12.0–15.0)
Hemoglobin: 9.2 g/dL — ABNORMAL LOW (ref 12.0–15.0)
MCH: 29.1 pg (ref 26.0–34.0)
MCH: 29.9 pg (ref 26.0–34.0)
MCHC: 31.1 g/dL (ref 30.0–36.0)
MCHC: 31.6 g/dL (ref 30.0–36.0)
MCV: 93.5 fL (ref 80.0–100.0)
MCV: 94.5 fL (ref 80.0–100.0)
Platelets: 147 10*3/uL — ABNORMAL LOW (ref 150–400)
Platelets: 135 10*3/uL — ABNORMAL LOW (ref 150–400)
RBC: 3.54 MIL/uL — ABNORMAL LOW (ref 3.87–5.11)
RBC: 3.08 MIL/uL — ABNORMAL LOW (ref 3.87–5.11)
RDW: 16.6 % — ABNORMAL HIGH (ref 11.5–15.5)
RDW: 16.8 % — ABNORMAL HIGH (ref 11.5–15.5)
WBC: 5.6 10*3/uL (ref 4.0–10.5)
WBC: 5.2 10*3/uL (ref 4.0–10.5)
nRBC: 0 % (ref 0.0–0.2)
nRBC: 0 % (ref 0.0–0.2)

## 2023-03-30 LAB — CREATININE, SERUM
Creatinine, Ser: 1.04 mg/dL — ABNORMAL HIGH (ref 0.44–1.00)
GFR, Estimated: 60 mL/min — ABNORMAL LOW (ref >60)

## 2023-03-30 LAB — TSH: TSH: 6.02 u[IU]/mL — ABNORMAL HIGH (ref 0.350–4.500)

## 2023-03-30 LAB — PROTIME-INR
INR: 1.8 — ABNORMAL HIGH (ref 0.8–1.2)
Prothrombin Time: 21.5 s — ABNORMAL HIGH (ref 11.4–15.2)

## 2023-03-30 MED ORDER — ROPINIROLE HCL 0.25 MG PO TABS
0.2500 mg | ORAL_TABLET | Freq: Every day | ORAL | Status: DC
  Administered 2023-03-30 – 2023-04-02 (×5): 0.25 mg via ORAL
  Filled 2023-03-30 (×5): qty 1

## 2023-03-30 MED ORDER — LEVOTHYROXINE SODIUM 25 MCG PO TABS
175.0000 ug | ORAL_TABLET | Freq: Every day | ORAL | Status: DC
  Administered 2023-03-31 – 2023-04-03 (×4): 175 ug via ORAL
  Filled 2023-03-30 (×4): qty 1

## 2023-03-30 MED ORDER — HYDROCORTISONE 0.5 % EX CREA
TOPICAL_CREAM | Freq: Four times a day (QID) | CUTANEOUS | Status: DC | PRN
  Filled 2023-03-30: qty 28.35

## 2023-03-30 MED ORDER — VITAMIN C 500 MG PO TABS
250.0000 mg | ORAL_TABLET | Freq: Every day | ORAL | Status: DC
  Administered 2023-03-30 – 2023-04-03 (×5): 250 mg via ORAL
  Filled 2023-03-30 (×5): qty 1

## 2023-03-30 MED ORDER — MIDODRINE HCL 5 MG PO TABS
10.0000 mg | ORAL_TABLET | Freq: Three times a day (TID) | ORAL | Status: DC
  Administered 2023-03-30 – 2023-04-03 (×12): 10 mg via ORAL
  Filled 2023-03-30 (×10): qty 2

## 2023-03-30 MED ORDER — IRON-VITAMIN C 100-250 MG PO TABS: ORAL_TABLET | Freq: Every day | ORAL | Status: DC

## 2023-03-30 MED ORDER — ALBUMIN HUMAN 25 % IV SOLN
25.0000 g | Freq: Once | INTRAVENOUS | Status: AC
  Administered 2023-03-30: 25 g via INTRAVENOUS
  Filled 2023-03-30: qty 100

## 2023-03-30 MED ORDER — TAMSULOSIN HCL 0.4 MG PO CAPS
0.4000 mg | ORAL_CAPSULE | Freq: Every day | ORAL | Status: DC
  Administered 2023-03-30 – 2023-04-03 (×5): 0.4 mg via ORAL
  Filled 2023-03-30 (×5): qty 1

## 2023-03-30 MED ORDER — LACTULOSE 10 GM/15ML PO SOLN
30.0000 g | Freq: Three times a day (TID) | ORAL | Status: DC
Start: 2023-03-30 — End: 2023-04-03
  Administered 2023-03-30 – 2023-04-03 (×13): 30 g via ORAL
  Filled 2023-03-30 (×7): qty 45
  Filled 2023-03-30: qty 60
  Filled 2023-03-30 (×5): qty 45

## 2023-03-30 MED ORDER — COPPER 2 MG PO TABS
2.0000 mg | Freq: Every day | Status: DC
  Administered 2023-03-30 – 2023-04-03 (×5): 2 mg via ORAL
  Filled 2023-03-30 (×5): qty 1

## 2023-03-30 MED ORDER — HEPARIN SODIUM (PORCINE) 5000 UNIT/ML IJ SOLN
5000.0000 [IU] | Freq: Three times a day (TID) | INTRAMUSCULAR | Status: DC
  Administered 2023-03-30 – 2023-03-31 (×3): 5000 [IU] via SUBCUTANEOUS
  Filled 2023-03-30 (×5): qty 1

## 2023-03-30 MED ORDER — LEVOTHYROXINE SODIUM 50 MCG PO TABS
150.0000 ug | ORAL_TABLET | Freq: Every day | ORAL | Status: DC
  Administered 2023-03-30: 150 ug via ORAL
  Filled 2023-03-30: qty 1

## 2023-03-30 MED ORDER — VITAMIN B-12 1000 MCG PO TABS
2000.0000 ug | ORAL_TABLET | ORAL | Status: DC
  Administered 2023-03-31: 2000 ug via ORAL
  Filled 2023-03-30: qty 2

## 2023-03-30 MED ORDER — RIFAXIMIN 550 MG PO TABS
550.0000 mg | ORAL_TABLET | Freq: Two times a day (BID) | ORAL | Status: DC
  Administered 2023-03-30 – 2023-04-03 (×10): 550 mg via ORAL
  Filled 2023-03-30 (×10): qty 1

## 2023-03-30 MED ORDER — PANTOPRAZOLE SODIUM 40 MG PO TBEC
40.0000 mg | DELAYED_RELEASE_TABLET | Freq: Two times a day (BID) | ORAL | Status: DC
  Administered 2023-03-30 – 2023-04-03 (×9): 40 mg via ORAL
  Filled 2023-03-30 (×8): qty 1

## 2023-03-30 MED ORDER — LIDOCAINE 5 % EX PTCH: 1.0000 | MEDICATED_PATCH | Freq: Every day | CUTANEOUS | Status: DC | PRN

## 2023-03-30 MED ORDER — FAMOTIDINE 20 MG PO TABS
20.0000 mg | ORAL_TABLET | Freq: Every day | ORAL | Status: DC
  Administered 2023-03-30 – 2023-04-03 (×5): 20 mg via ORAL
  Filled 2023-03-30 (×5): qty 1

## 2023-03-30 MED ORDER — ATORVASTATIN CALCIUM 40 MG PO TABS
40.0000 mg | ORAL_TABLET | Freq: Every day | ORAL | Status: DC
  Administered 2023-03-30 – 2023-04-03 (×5): 40 mg via ORAL
  Filled 2023-03-30 (×5): qty 1

## 2023-03-30 MED ORDER — MEGESTROL ACETATE 40 MG PO TABS
40.0000 mg | ORAL_TABLET | Freq: Two times a day (BID) | ORAL | Status: DC
  Administered 2023-03-30 – 2023-04-03 (×10): 40 mg via ORAL
  Filled 2023-03-30 (×10): qty 1

## 2023-03-30 MED ORDER — FOLIC ACID 1 MG PO TABS
1.0000 mg | ORAL_TABLET | Freq: Every day | ORAL | Status: DC
  Administered 2023-03-30 – 2023-04-03 (×5): 1 mg via ORAL
  Filled 2023-03-30 (×5): qty 1

## 2023-03-30 MED ORDER — VENLAFAXINE HCL ER 75 MG PO CP24
75.0000 mg | ORAL_CAPSULE | Freq: Every day | ORAL | Status: DC
  Administered 2023-03-30 – 2023-04-03 (×5): 75 mg via ORAL
  Filled 2023-03-30 (×5): qty 1

## 2023-03-30 NOTE — ED Notes (Signed)
Report called to nurse on tele.

## 2023-03-30 NOTE — Plan of Care (Signed)
   Problem: Clinical Measurements: Goal: Ability to maintain clinical measurements within normal limits will improve Outcome: Progressing   Problem: Clinical Measurements: Goal: Will remain free from infection Outcome: Progressing   Problem: Clinical Measurements: Goal: Diagnostic test results will improve Outcome: Progressing

## 2023-03-30 NOTE — Progress Notes (Signed)
 PROGRESS NOTE    [name]  UYQ:[id number] DOB: 1955-08-29 DOA: 03/29/2023 PCP: [name], MD  Chief Complaint  Patient presents with   Altered Mental Status    Hospital Course:  [name] is 68 y.o. female with liver cirrhosis secondary to NASH, hypothyroidism, hyperlipidemia, chronic back pain, GERD, chronic anemia, was brought to the ED after being found increasingly confused.  Per patient's son patient has been taking lower dose of lactulose than prescribed due to increasing bowel movements.  Patient had recent admission from 2/26 through 3/3 for hepatic encephalopathy.  Patient's son reports that she has been following with gastroenterology at [facility] and was recently found that her copper has been at lower levels and was scheduled to test for copper excretion.  Subjective: On evaluation today patient is very drowsy.  She does not participate in exam.  She briefly opens her eyes to answer questions but then goes back to sleep.   Objective: Vitals:   03/29/23 2249 03/30/23 0150 03/30/23 0430 03/30/23 0700  BP: (!) 142/56  (!) 146/64 (!) 123/99  Pulse: 78  87 85  Resp: 17  18 15   Temp: 98.7 F (37.1 C) 98 F (36.7 C) 98.1 F (36.7 C)   TempSrc: Oral Oral    SpO2: 99%  99% 100%  Weight:      Height:       No intake or output data in the 24 hours ending 03/30/23 0918 Filed Weights   03/29/23 1647  Weight: 94.6 kg    Examination: General exam: Appears calm and comfortable, NAD  Respiratory system: No work of breathing, symmetric chest wall expansion Cardiovascular system: S1 & S2 heard, RRR.  Gastrointestinal system: Abdomen is nondistended, soft and nontender.  Neuro: Drowsy, but arousable.  Requires significant prompting.  Is able to report her name.  Too drowsy to answer more questions Extremities: Symmetric, expected ROM Skin: No rashes, lesions  Assessment & Plan:  Principal Problem:   Hepatic encephalopathy (HCC) Active Problems:   Cirrhosis due  to NASH   Iron deficiency anemia   GERD (gastroesophageal reflux disease)   Thrombocytopenia (HCC)   Hypothyroidism   Short gut syndrome   Pseudophakia of left eye   ARF (acute renal failure) (HCC)   Depression   RLS (restless legs syndrome)    Acute hepatic encephalopathy Cirrhosis secondary to NASH - Hyperammonemia - No acute signs of infection -- NH4 183 - Continue lactulose 30 g 3 times daily, continue rifaximin. Has been having 7+ Bms at home so son backed off on lactulose to BID. - Continue monitoring status closely - MELD score: 17 points, 6.0% estimated 66-month mortality.  AKI -- Appears baseline creatinine is 0.8, elevated to 1.17 on arrival. Improving now. - Renally dosed with a creatinine clearance of 56 - Avoid nephrotoxic meds when possible - Hold spironolactone and Lasix for now - Status post 1 dose of albumin  Hypothyroidism - TSH: 6.020, will increase home dose of Synthroid - Was on 150 mcg at home, increased to 175 mcg today.  Will need to repeat TSH in clinic in 4 to 6 weeks and continue to titrate as needed  Chronic iron deficiency anemia - Reports she gets IV iron from hematology - Follow CBC.  Transfuse if hemoglobin falls below 7.  Hemoglobin appears to have downtrended to point since arrival, suspect this may be hemodilutional.  Monitor closely.  Thrombocytopenia Coagulopathy - Platelets 135, INR 1.8 - Secondary to cirrhosis as above  Hyperlipidemia - Continue statin  GERD - Continue PPI  Restless leg syndrome - Continue Requip  Depression - Continue Effexor  Chronic back pain - Chronically on hydrocodone, has not taken it in over a week.  Will monitor for withdrawal - Resume when p.o. intake is satisfactory.  Copper deficiency -- Patient has been seeing outpatient GI and in February had serum copper level drawn and was found to be 47.  Her son reports she was told to initiate p.o. copper, and has 24-hour urine collection for copper  levels. - Will repeat a serum copper and continue with home dose supplementation for now. - on review of hematology notes it does not appear that she has any concern for sideroblastic anemia.  Has known iron deficiency anemia.  DVT prophylaxis: SCDs   Code Status: Limited: Do not attempt resuscitation (DNR) -DNR-LIMITED -Do Not Intubate/DNI  Family Communication: Discussed with patient's son, [name], on the phone. Disposition:  IOBservation, hospitalized for work up, not at baseline, will discharge to home when back to mental status baseline  Consultants:    Procedures:    Antimicrobials:  Anti-infectives (From admission, onward)    Start     Dose/Rate Route Frequency Ordered Stop   03/30/23 0145  rifaximin (XIFAXAN) tablet 550 mg        550 mg Oral 2 times daily 03/30/23 0137         Data Reviewed: I have personally reviewed following labs and imaging studies CBC: Recent Labs  Lab 03/29/23 1628 03/30/23 0144 03/30/23 0644  WBC 6.3 5.6 5.2  NEUTROABS 3.4  --   --   HGB 11.0* 10.3* 9.2*  HCT 35.7* 33.1* 29.1*  MCV 94.9 93.5 94.5  PLT 153 147* 135*   Basic Metabolic Panel: Recent Labs  Lab 03/29/23 1628 03/30/23 0144 03/30/23 0644  NA 140  --  137  K 4.0  --  3.5  CL 113*  --  112*  CO2 20*  --  20*  GLUCOSE 133*  --  114*  BUN 21  --  22  CREATININE 1.17* 1.04* 1.07*  CALCIUM 8.8*  --  8.4*   GFR: Estimated Creatinine Clearance: 56.2 mL/min (A) (by C-G formula based on SCr of 1.07 mg/dL (H)). Liver Function Tests: Recent Labs  Lab 03/29/23 1628  AST 37  ALT 25  ALKPHOS 96  BILITOT 1.7*  PROT 6.3*  ALBUMIN 2.8*   CBG: No results for input(s): "GLUCAP" in the last 168 hours.  No results found for this or any previous visit (from the past 240 hours).   Radiology Studies: No results found.  Scheduled Meds:  ascorbic acid  250 mg Oral Daily   atorvastatin  40 mg Oral Daily   copper  2 mg Oral Daily   [START ON 03/31/2023] cyanocobalamin  2,000 mcg  Oral Q Fri   famotidine  20 mg Oral Daily   folic acid  1 mg Oral Daily   heparin  5,000 Units Subcutaneous Q8H   lactulose  30 g Oral TID   levothyroxine  150 mcg Oral Q0600   megestrol  40 mg Oral BID   midodrine  10 mg Oral TID WC   pantoprazole  40 mg Oral BID AC   rifaximin  550 mg Oral BID   rOPINIRole  0.25 mg Oral QHS   tamsulosin  0.4 mg Oral Daily   venlafaxine XR  75 mg Oral Q breakfast   Continuous Infusions:   LOS: 0 days  MDM: Patient is high risk for one  or more organ failure.  They necessitate ongoing hospitalization for continued IV therapies and subsequent lab monitoring. Total time spent interpreting labs and vitals, coordinating care amongst consultants and care team members, directly assessing and discussing care with the patient and/or family: 55 min   Debarah Crape, DO [facility]  To contact the attending physician between 7A-7P please use Epic Chat. To contact the covering physician during after hours 7P-7A, please review Amion.   03/30/2023, 9:18 AM   *This document has been created with the assistance of dictation software. Please excuse typographical errors. *

## 2023-03-31 DIAGNOSIS — K7682 Hepatic encephalopathy: Secondary | ICD-10-CM | POA: Diagnosis not present

## 2023-03-31 LAB — COMPREHENSIVE METABOLIC PANEL
ALT: 19 U/L (ref 0–44)
AST: 27 U/L (ref 15–41)
Albumin: 2.2 g/dL — ABNORMAL LOW (ref 3.5–5.0)
Alkaline Phosphatase: 68 U/L (ref 38–126)
Anion gap: 4 — ABNORMAL LOW (ref 5–15)
BUN: 17 mg/dL (ref 8–23)
CO2: 17 mmol/L — ABNORMAL LOW (ref 22–32)
Calcium: 8.3 mg/dL — ABNORMAL LOW (ref 8.9–10.3)
Chloride: 118 mmol/L — ABNORMAL HIGH (ref 98–111)
Creatinine, Ser: 0.89 mg/dL (ref 0.44–1.00)
GFR, Estimated: >60 mL/min (ref >60)
Glucose, Bld: 127 mg/dL — ABNORMAL HIGH (ref 70–99)
Potassium: 3.5 mmol/L (ref 3.5–5.1)
Sodium: 139 mmol/L (ref 135–145)
Total Bilirubin: 2 mg/dL — ABNORMAL HIGH (ref 0.0–1.2)
Total Protein: 4.5 g/dL — ABNORMAL LOW (ref 6.5–8.1)

## 2023-03-31 LAB — CBC WITH DIFFERENTIAL/PLATELET
Abs Immature Granulocytes: 0.01 10*3/uL (ref 0.00–0.07)
Basophils Absolute: 0 10*3/uL (ref 0.0–0.1)
Basophils Relative: 1 %
Eosinophils Absolute: 0.4 10*3/uL (ref 0.0–0.5)
Eosinophils Relative: 8 %
HCT: 30.1 % — ABNORMAL LOW (ref 36.0–46.0)
Hemoglobin: 9.1 g/dL — ABNORMAL LOW (ref 12.0–15.0)
Immature Granulocytes: 0 %
Lymphocytes Relative: 27 %
Lymphs Abs: 1.4 10*3/uL (ref 0.7–4.0)
MCH: 29.3 pg (ref 26.0–34.0)
MCHC: 30.2 g/dL (ref 30.0–36.0)
MCV: 96.8 fL (ref 80.0–100.0)
Monocytes Absolute: 0.4 10*3/uL (ref 0.1–1.0)
Monocytes Relative: 8 %
Neutro Abs: 2.9 10*3/uL (ref 1.7–7.7)
Neutrophils Relative %: 56 %
Platelets: 117 10*3/uL — ABNORMAL LOW (ref 150–400)
RBC: 3.11 MIL/uL — ABNORMAL LOW (ref 3.87–5.11)
RDW: 16.8 % — ABNORMAL HIGH (ref 11.5–15.5)
WBC: 5.1 10*3/uL (ref 4.0–10.5)
nRBC: 0 % (ref 0.0–0.2)

## 2023-03-31 LAB — COPPER, SERUM: Copper: 42 ug/dL — ABNORMAL LOW (ref 80–158)

## 2023-03-31 MED ORDER — DIPHENHYDRAMINE HCL 25 MG PO CAPS
25.0000 mg | ORAL_CAPSULE | Freq: Three times a day (TID) | ORAL | Status: DC | PRN
  Administered 2023-03-31 – 2023-04-02 (×3): 25 mg via ORAL
  Filled 2023-03-31 (×3): qty 1

## 2023-03-31 NOTE — Progress Notes (Signed)
 PROGRESS NOTE    [name]  ZOX:[id number] DOB: [birth year] DOA: 03/29/2023 PCP: Kara Hoit, MD  Chief Complaint  Patient presents with   Altered Mental Status    Hospital Course:  [name] is 68 y.o. female with liver cirrhosis secondary to NASH, hypothyroidism, hyperlipidemia, chronic back pain, GERD, chronic anemia, was brought to the ED after being found increasingly confused.  Per patient's son patient has been taking lower dose of lactulose than prescribed due to increasing bowel movements.  Patient had recent admission from 2/26 through 3/3 for hepatic encephalopathy.  Patient's son reports that she has been following with gastroenterology at [facility] and was recently found that her copper has been at lower levels and was scheduled to test for copper excretion.  Subjective: On evaluation today patient has significantly improved compared to yesterday.  Her son is at bedside and reports that she is still too unsteady to ambulate and appears to be off baseline.  Objective: Vitals:   03/30/23 1526 03/30/23 2028 03/30/23 2347 03/31/23 0437  BP: 129/60 139/73 (!) 131/54 134/68  Pulse: 81 80 78 75  Resp:  18 18 18   Temp: 98.6 F (37 C)  97.7 F (36.5 C) 98.5 F (36.9 C)  TempSrc:      SpO2: 100% 100% 100% 100%  Weight:      Height:        Intake/Output Summary (Last 24 hours) at 03/31/2023 0842 Last data filed at 03/31/2023 0454 Gross per 24 hour  Intake --  Output 200 ml  Net -200 ml   Filed Weights   03/29/23 1647  Weight: 94.6 kg    Examination: General exam: Appears calm and comfortable, NAD  Respiratory system: No work of breathing, symmetric chest wall expansion Cardiovascular system: S1 & S2 heard, RRR.  Gastrointestinal system: Abdomen is nondistended, soft and nontender.  Neuro: Alert and oriented, does require some prompting for questions.  Appears pleasantly confused at times. Extremities: Symmetric, expected ROM Skin: No rashes,  lesions  Assessment & Plan:  Principal Problem:   Hepatic encephalopathy (HCC) Active Problems:   Cirrhosis due to NASH   Iron deficiency anemia   GERD (gastroesophageal reflux disease)   Thrombocytopenia (HCC)   Hypothyroidism   Short gut syndrome   Pseudophakia of left eye   ARF (acute renal failure) (HCC)   Depression   RLS (restless legs syndrome)    Acute hepatic encephalopathy Cirrhosis secondary to NASH Hyperammonemia - No acute signs of infection -- NH4 183, no need to trend.  This will continue to improve - Continue lactulose 30 g 3 times daily, continue rifaximin. Has been having 7+ Bms at home so son backed off on lactulose to BID. - Mental status has improved significantly over the last 24 hours though she is still not at baseline.  Son has hesitation with her returning home today. - MELD score: 17 points, 6.0% estimated 63-month mortality.  AKI -- Appears baseline creatinine is 0.8, elevated to 1.17 on arrival.  To baseline now. - Renally dosed with a creatinine clearance of 56 - Avoid nephrotoxic meds when possible - Hold spironolactone and Lasix, reinitiate tomorrow - Status post 1 dose of albumin  Hypothyroidism - TSH: 6.020, will increase home dose of Synthroid - Was on 150 mcg at home, increased to 175 mcg.  Will need to repeat TSH in clinic in 4 to 6 weeks and continue to titrate as needed  Chronic iron deficiency anemia - Reports she gets IV iron from hematology - Continue  to follow CBC.  Transfuse if hemoglobin falls below 7.  Hemoglobin appears to have downtrended 111>9 since arrival, suspect this may be hemodilutional.  Has stabilized now.  No occult blood loss.  Continue to monitor closely.  Thrombocytopenia Coagulopathy - Platelets 135, INR 1.8 - Secondary to cirrhosis as above  Hyperlipidemia - Continue statin  GERD - Continue PPI  Restless leg syndrome - Continue Requip  Depression - Continue Effexor  Chronic back pain -  Chronically on hydrocodone, has not taken it in over a week.  Will monitor for withdrawal - Resume when p.o. intake is satisfactory.  Copper deficiency -- Patient has been seeing outpatient GI and in February had serum copper level drawn and was found to be 47.  Her son reports she was told to initiate p.o. copper, and has 24-hour urine collection for copper levels. - Will repeat a serum copper and continue with home dose supplementation for now. - On review of hematology notes it does not appear that she has any concern for sideroblastic anemia.  Has known iron deficiency anemia.  DVT prophylaxis: SCDs   Code Status: Limited: Do not attempt resuscitation (DNR) -DNR-LIMITED -Do Not Intubate/DNI  Family Communication: Discussed with patient's son, Kara Ruiz, at the bedside Disposition:  Observation, hospitalized for work up, not at baseline, will discharge to home when back to mental status baseline, hopefully tomorrow.  Consultants:    Procedures:    Antimicrobials:  Anti-infectives (From admission, onward)    Start     Dose/Rate Route Frequency Ordered Stop   03/30/23 0145  rifaximin (XIFAXAN) tablet 550 mg        550 mg Oral 2 times daily 03/30/23 0137         Data Reviewed: I have personally reviewed following labs and imaging studies CBC: Recent Labs  Lab 03/29/23 1628 03/30/23 0144 03/30/23 0644  WBC 6.3 5.6 5.2  NEUTROABS 3.4  --   --   HGB 11.0* 10.3* 9.2*  HCT 35.7* 33.1* 29.1*  MCV 94.9 93.5 94.5  PLT 153 147* 135*   Basic Metabolic Panel: Recent Labs  Lab 03/29/23 1628 03/30/23 0144 03/30/23 0644  NA 140  --  137  K 4.0  --  3.5  CL 113*  --  112*  CO2 20*  --  20*  GLUCOSE 133*  --  114*  BUN 21  --  22  CREATININE 1.17* 1.04* 1.07*  CALCIUM 8.8*  --  8.4*   GFR: Estimated Creatinine Clearance: 56.2 mL/min (A) (by C-G formula based on SCr of 1.07 mg/dL (H)). Liver Function Tests: Recent Labs  Lab 03/29/23 1628  AST 37  ALT 25  ALKPHOS 96   BILITOT 1.7*  PROT 6.3*  ALBUMIN 2.8*   CBG: No results for input(s): "GLUCAP" in the last 168 hours.  No results found for this or any previous visit (from the past 240 hours).   Radiology Studies: No results found.  Scheduled Meds:  ascorbic acid  250 mg Oral Daily   atorvastatin  40 mg Oral Daily   copper  2 mg Oral Daily   cyanocobalamin  2,000 mcg Oral Q Fri   famotidine  20 mg Oral Daily   folic acid  1 mg Oral Daily   heparin  5,000 Units Subcutaneous Q8H   lactulose  30 g Oral TID   levothyroxine  175 mcg Oral Q0600   megestrol  40 mg Oral BID   midodrine  10 mg Oral TID WC   pantoprazole  40 mg Oral BID AC   rifaximin  550 mg Oral BID   rOPINIRole  0.25 mg Oral QHS   tamsulosin  0.4 mg Oral Daily   venlafaxine XR  75 mg Oral Q breakfast   Continuous Infusions:   LOS: 0 days  MDM: Patient is high risk for one or more organ failure.  They necessitate ongoing hospitalization for continued IV therapies and subsequent lab monitoring. Total time spent interpreting labs and vitals, coordinating care amongst consultants and care team members, directly assessing and discussing care with the patient and/or family: 55 min   Debarah Crape, DO [facility]  To contact the attending physician between 7A-7P please use Epic Chat. To contact the covering physician during after hours 7P-7A, please review Amion.   03/31/2023, 8:42 AM   *This document has been created with the assistance of dictation software. Please excuse typographical errors. *

## 2023-03-31 NOTE — Plan of Care (Signed)

## 2023-03-31 NOTE — TOC Initial Note (Signed)
 Transition of Care [facility]) - Initial/Assessment Note    Patient Details  Name: [name] MRN: [id number] Date of Birth: 02/02/1955  [facility]) CM/SW Contact:    Otelia Santee, LCSW Phone Number: 03/31/2023, 12:44 PM  Clinical Narrative:                 Pt from home with son. Pt active with [facility] for HHPT. [facility] HH order will need to be placed prior to DC. Pt has the following DME at home: walker, Rollator, wheelchair, shower chair, hospital bed, hoyer and denies further DME needs. Pt's son to transport home at discharge. TOC will continue to follow.   Expected Discharge Plan: Home w Home Health Services Barriers to Discharge: No Barriers Identified   Patient Goals and CMS Choice Patient states their goals for this hospitalization and ongoing recovery are:: For pt to return home CMS Medicare.gov Compare Post Acute Care list provided to:: Patient Represenative (must comment) Choice offered to / list presented to : Adult Children [place] ownership interest in [facility].provided to::  (NA)    Expected Discharge Plan and Services In-house Referral: NA Discharge Planning Services: NA Post Acute Care Choice: Home Health, Resumption of Svcs/PTA Provider Living arrangements for the past 2 months: Single Family Home                 DME Arranged: N/A DME Agency: NA                  Prior Living Arrangements/Services Living arrangements for the past 2 months: Single Family Home Lives with:: Adult Children Patient language and need for interpreter reviewed:: Yes Do you feel safe going back to the place where you live?: Yes      Need for Family Participation in Patient Care: Yes (Comment) Care giver support system in place?: Yes (comment) Current home services: DME, Home PT (walker, Rolator, wheelchair, shower chair, hospital bed, hoyer, HHPT w/ [facility]) Criminal Activity/Legal Involvement Pertinent to Current Situation/Hospitalization: No -  Comment as needed  Activities of Daily Living      Permission Sought/Granted Permission sought to share information with : Facility Medical sales representative, Family Supports Permission granted to share information with : Yes, Verbal Permission Granted  Share Information with NAME: Juliona Vales  Permission granted to share info w AGENCY: [phone]  Permission granted to share info w Relationship: Son     Emotional Assessment Appearance:: Appears older than stated age Attitude/Demeanor/Rapport: Engaged Affect (typically observed): Accepting Orientation: : Oriented to Self, Oriented to Place, Oriented to Situation Alcohol / Substance Use: Not Applicable Psych Involvement: No (comment)  Admission diagnosis:  Hepatic encephalopathy (HCC) [K76.82] Patient Active Problem List   Diagnosis Date Noted   ARF (acute renal failure) (HCC) 03/30/2023   Depression 03/30/2023   RLS (restless legs syndrome) 03/30/2023   Pruritus 03/06/2023   Edema of both lower legs 03/06/2023   Insomnia 03/06/2023   Compression fracture of body of thoracic vertebra (HCC) 03/06/2023   Anemia of chronic disease 02/22/2023   Hepatic encephalopathy (HCC) 12/10/2022   Lumbar radiculopathy 12/10/2022   Grade I diastolic dysfunction 12/10/2022   History of DVT (deep vein thrombosis) 12/01/2022   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   Acute hepatic encephalopathy (HCC) 11/30/2022   DNR (do not resuscitate)/DNI(Do Not intubate) 10/05/2022   Vaginal bleeding 10/05/2022   Physical deconditioning 08/17/2022   Decompensated  cirrhosis (HCC) 08/17/2022   Hypotension 08/11/2022   Folate deficiency 06/14/2022   Systolic murmur 06/12/2022   Iron deficiency anemia 05/16/2022   Thrombocytopenia (HCC) 05/16/2022   History of eye surgery 05/01/2022   Corneal abnormality 04/29/2022   Status post eye surgery 04/29/2022   Corneal  perforation of left eye 04/18/2022   Pseudophakia of left eye 02/08/2022   Mastitis 12/15/2021   Physical debility 12/10/2021   Palliative care encounter 12/01/2021   Postmenopausal 11/24/2021   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   Polypharmacy 06/22/2021   Hyperammonemia (HCC) 06/17/2021   Former smoker 01/06/2021   Combined form of age-related cataract, right eye 10/01/2020   Corneal scarring 09/10/2020   Cerebral atherosclerosis 08/20/2020   Short gut syndrome 08/20/2020   DDD (degenerative disc disease), lumbar 08/20/2020   Abdominal wall hernia 03/06/2020   Splenomegaly 02/06/2019   Vitamin B12 deficiency 01/21/2019   Depression, major, recurrent, moderate (HCC) 09/10/2018   Hyperuricemia 08/25/2016   GERD (gastroesophageal reflux disease) 07/25/2016   Hypothyroidism 07/25/2016   Microcytic anemia 04/12/2016   Vitamin D deficiency 05/04/2015   History of adenomatous polyp of colon 04/28/2015   H/O angioedema 11/15/2014   Morbid obesity (HCC) 07/25/2014   Chronic diarrhea 06/01/2014   Hypokalemia 06/01/2014   PCP:  [name], MD Pharmacy:   [facility] [phone] - [place], [place] - [address] AT [facility] [address] [place] [address] Phone: [phone] Fax: [phone]  [place] [facility] [address] [place] [address] Phone: [phone] Fax: [phone]     Social Drivers of Health (SDOH) Social History: SDOH Screenings   Food Insecurity: No Food Insecurity (03/31/2023)  Housing: Low Risk  (03/31/2023)  Transportation Needs: No Transportation Needs (03/31/2023)  Utilities: Not At Risk (03/31/2023)  Depression (PHQ2-9): Medium Risk (03/06/2023)  Social Connections: Socially Isolated (03/31/2023)  Tobacco Use: Medium Risk (03/08/2023)   SDOH Interventions:     Readmission Risk Interventions    03/09/2023   11:21 AM 10/19/2022   10:12 AM 08/15/2022    3:20 PM  Readmission Risk Prevention Plan   Transportation Screening Complete Complete Complete  Medication Review Oceanographer) Complete Complete Complete  PCP or Specialist appointment within 3-5 days of discharge Complete Complete Complete  HRI or Home Care Consult Complete Complete Complete  SW Recovery Care/Counseling Consult Complete Complete Complete  Palliative Care Screening Not Applicable Not Applicable Not Applicable  Skilled Nursing Facility Not Applicable Not Applicable Not Applicable

## 2023-03-31 NOTE — Care Management Obs Status (Signed)
 MEDICARE OBSERVATION STATUS NOTIFICATION   Patient Details  Name: [name] MRN: [id number] Date of Birth: [birth year]   Medicare Observation Status Notification Given:       Jessie Foot, RN 03/31/2023, 10:28 AM

## 2023-03-31 NOTE — Care Management Obs Status (Signed)
 MEDICARE OBSERVATION STATUS NOTIFICATION   Patient Details  Name: [name] MRN: [id number] Date of Birth: January 03, 1956   Medicare Observation Status Notification Given:  Yes    Jessie Foot, RN 03/31/2023, 10:39 AM

## 2023-03-31 NOTE — Plan of Care (Signed)

## 2023-04-01 ENCOUNTER — Encounter (HOSPITAL_COMMUNITY): Payer: Self-pay | Admitting: Internal Medicine

## 2023-04-01 DIAGNOSIS — K7682 Hepatic encephalopathy: Secondary | ICD-10-CM | POA: Diagnosis not present

## 2023-04-01 MED ORDER — FUROSEMIDE 40 MG PO TABS
40.0000 mg | ORAL_TABLET | Freq: Every day | ORAL | Status: DC
  Administered 2023-04-01 – 2023-04-03 (×3): 40 mg via ORAL
  Filled 2023-04-01 (×3): qty 1

## 2023-04-01 MED ORDER — SPIRONOLACTONE 25 MG PO TABS
50.0000 mg | ORAL_TABLET | Freq: Every day | ORAL | Status: DC
  Administered 2023-04-01 – 2023-04-03 (×3): 50 mg via ORAL
  Filled 2023-04-01 (×3): qty 2

## 2023-04-01 MED ORDER — HYDROCODONE-ACETAMINOPHEN 5-325 MG PO TABS
1.0000 | ORAL_TABLET | Freq: Four times a day (QID) | ORAL | Status: DC | PRN
  Administered 2023-04-01 – 2023-04-02 (×3): 1 via ORAL
  Filled 2023-04-01 (×3): qty 1

## 2023-04-01 MED ORDER — LORATADINE 10 MG PO TABS
10.0000 mg | ORAL_TABLET | Freq: Every day | ORAL | Status: DC
Start: 2023-04-02 — End: 2023-04-01

## 2023-04-01 NOTE — Plan of Care (Signed)
  Problem: Education: Goal: Knowledge of General Education information will improve Description: Including pain rating scale, medication(s)/side effects and non-pharmacologic comfort measures Outcome: Progressing   Problem: Health Behavior/Discharge Planning: Goal: Ability to manage health-related needs will improve Outcome: Progressing   Problem: Activity: Goal: Risk for activity intolerance will decrease Outcome: Not Progressing   Problem: Nutrition: Goal: Adequate nutrition will be maintained Outcome: Progressing   Problem: Coping: Goal: Level of anxiety will decrease Outcome: Progressing

## 2023-04-01 NOTE — Plan of Care (Signed)

## 2023-04-01 NOTE — Progress Notes (Signed)
 PROGRESS NOTE    [name]  ZOX:[id number] DOB: [birth year] DOA: 03/29/2023 PCP: [name], MD  Chief Complaint  Patient presents with   Altered Mental Status    Hospital Course:  [name] is 68 y.o. female with liver cirrhosis secondary to NASH, hypothyroidism, hyperlipidemia, chronic back pain, GERD, chronic anemia, was brought to the ED after being found increasingly confused.  Per patient's son patient has been taking lower dose of lactulose than prescribed due to increasing bowel movements.  Patient had recent admission from 2/26 through 3/3 for hepatic encephalopathy.  Patient's son reports that she has been following with gastroenterology at [facility] and was recently found that her copper has been at lower levels and was scheduled to test for copper excretion.  Subjective: On evaluation today patient's mental status appears to be at baseline.  Her son endorses ongoing concerns with her ambulation abilities.  Physical therapy and mobility specialist have been unable to work with the patient today.  She has not yet attempted ambulation.  Discussed with bedside RN.  Objective: Vitals:   03/31/23 1429 03/31/23 2007 04/01/23 0701 04/01/23 1230  BP: (!) 121/55 129/65 137/68 (!) 141/70  Pulse: 73 80 77 80  Resp: 20 18 16 20   Temp: 98 F (36.7 C) 98.2 F (36.8 C) 99.1 F (37.3 C) 98.4 F (36.9 C)  TempSrc: Oral Oral Oral Oral  SpO2: 100% 100% 100% 97%  Weight:      Height:       No intake or output data in the 24 hours ending 04/01/23 1719  Filed Weights   03/29/23 1647  Weight: 94.6 kg    Examination: General exam: Appears calm and comfortable, NAD  Respiratory system: No work of breathing, symmetric chest wall expansion Cardiovascular system: S1 & S2 heard, RRR.  Gastrointestinal system: Abdomen is nondistended, soft and nontender.  Neuro: Alert and oriented, does require some prompting for questions.  Appears pleasantly confused at times. Extremities:  Symmetric, expected ROM Skin: No rashes, lesions  Assessment & Plan:  Principal Problem:   Hepatic encephalopathy (HCC) Active Problems:   Cirrhosis due to NASH   Iron deficiency anemia   GERD (gastroesophageal reflux disease)   Thrombocytopenia (HCC)   Hypothyroidism   Short gut syndrome   Pseudophakia of left eye   ARF (acute renal failure) (HCC)   Depression   RLS (restless legs syndrome)    Acute hepatic encephalopathy Cirrhosis secondary to NASH Hyperammonemia - No acute signs of infection --Initial NIH 04/10/1981.  Mental status continues to improve - Continue lactulose 30 g 3 times daily, continue rifaximin - Discussed home regimen with her son. - Mental status appears to have improved to baseline.  Son remains hesitant about discharging home given lack of ambulation.  She does live with her son but requires the ability to ambulate with walker for safety and bathroom. - PT eval pending still.  Have discussed with bedside RN, have contacted mobility specialist. - MELD score: 17 points, 6.0% estimated 40-month mortality.  AKI -- Appears baseline creatinine is 0.8, elevated to 1.17 on arrival.  Back to baseline now - Renally dosed with a creatinine clearance of 56 - Avoid nephrotoxic meds when possible -Initiate home dose spironolactone and Lasix today.  Trend CMP tomorrow. - Status post 1 dose of albumin  Hypothyroidism - TSH: 6.020, will increase home dose of Synthroid - Was on 150 mcg at home, increased to 175 mcg.  Will need to repeat TSH in clinic in 4 to 6 weeks  and continue to titrate as needed  Chronic iron deficiency anemia - Reports she gets IV iron from hematology -Continue to follow CBC.  Transfuse if hemoglobin falls below 7.  Hemoglobin appears to have downtrended 11>9 since arrival, suspect this is hemodilutional.  Has stabilized now.  No occult blood loss.  Continue to monitor closely.  Thrombocytopenia Coagulopathy - Platelets 135, INR 1.8 -  Secondary to cirrhosis as above  Hyperlipidemia - Continue statin  GERD - Continue PPI  Restless leg syndrome - Continue Requip  Depression - Continue Effexor  Chronic back pain - Chronically on hydrocodone, has not taken it in over a week.  -- Zoom Norco as needed  Copper deficiency -- Patient has been seeing outpatient GI and in February had serum copper level drawn and was found to be 47.  Her son reports she was told to initiate p.o. copper, and has 24-hour urine collection for copper levels. -Repeat serum copper here demonstrates continued deficiency. - On review of hematology notes it does not appear that she has any concern for sideroblastic anemia.  Has known iron deficiency anemia.  DVT prophylaxis: SCDs   Code Status: Limited: Do not attempt resuscitation (DNR) -DNR-LIMITED -Do Not Intubate/DNI  Family Communication: Discussed with patient's son, [name], at the bedside Disposition: Observation, has not yet ambulated.  Hopefully tomorrow and can discharge home after.  Consultants:    Procedures:    Antimicrobials:  Anti-infectives (From admission, onward)    Start     Dose/Rate Route Frequency Ordered Stop   03/30/23 0145  rifaximin (XIFAXAN) tablet 550 mg        550 mg Oral 2 times daily 03/30/23 0137         Data Reviewed: I have personally reviewed following labs and imaging studies CBC: Recent Labs  Lab 03/29/23 1628 03/30/23 0144 03/30/23 0644 03/31/23 0947  WBC 6.3 5.6 5.2 5.1  NEUTROABS 3.4  --   --  2.9  HGB 11.0* 10.3* 9.2* 9.1*  HCT 35.7* 33.1* 29.1* 30.1*  MCV 94.9 93.5 94.5 96.8  PLT 153 147* 135* 117*   Basic Metabolic Panel: Recent Labs  Lab 03/29/23 1628 03/30/23 0144 03/30/23 0644 03/31/23 0947  NA 140  --  137 139  K 4.0  --  3.5 3.5  CL 113*  --  112* 118*  CO2 20*  --  20* 17*  GLUCOSE 133*  --  114* 127*  BUN 21  --  22 17  CREATININE 1.17* 1.04* 1.07* 0.89  CALCIUM 8.8*  --  8.4* 8.3*   GFR: Estimated Creatinine  Clearance: 67.6 mL/min (by C-G formula based on SCr of 0.89 mg/dL). Liver Function Tests: Recent Labs  Lab 03/29/23 1628 03/31/23 0947  AST 37 27  ALT 25 19  ALKPHOS 96 68  BILITOT 1.7* 2.0*  PROT 6.3* 4.5*  ALBUMIN 2.8* 2.2*   CBG: No results for input(s): "GLUCAP" in the last 168 hours.  No results found for this or any previous visit (from the past 240 hours).   Radiology Studies: No results found.  Scheduled Meds:  ascorbic acid  250 mg Oral Daily   atorvastatin  40 mg Oral Daily   copper  2 mg Oral Daily   cyanocobalamin  2,000 mcg Oral Q Fri   famotidine  20 mg Oral Daily   folic acid  1 mg Oral Daily   heparin  5,000 Units Subcutaneous Q8H   lactulose  30 g Oral TID   levothyroxine  175 mcg  Oral Q0600   megestrol  40 mg Oral BID   midodrine  10 mg Oral TID WC   pantoprazole  40 mg Oral BID AC   rifaximin  550 mg Oral BID   rOPINIRole  0.25 mg Oral QHS   tamsulosin  0.4 mg Oral Daily   venlafaxine XR  75 mg Oral Q breakfast   Continuous Infusions:   LOS: 0 days  MDM: Patient is high risk for one or more organ failure.  They necessitate ongoing hospitalization for continued IV therapies and subsequent lab monitoring. Total time spent interpreting labs and vitals, coordinating care amongst consultants and care team members, directly assessing and discussing care with the patient and/or family: 55 min   Debarah Crape, DO [facility]  To contact the attending physician between 7A-7P please use Epic Chat. To contact the covering physician during after hours 7P-7A, please review Amion.   04/01/2023, 5:19 PM   *This document has been created with the assistance of dictation software. Please excuse typographical errors. *

## 2023-04-02 DIAGNOSIS — K7682 Hepatic encephalopathy: Secondary | ICD-10-CM | POA: Diagnosis not present

## 2023-04-02 LAB — COMPREHENSIVE METABOLIC PANEL
ALT: 18 U/L (ref 0–44)
AST: 24 U/L (ref 15–41)
Albumin: 2.3 g/dL — ABNORMAL LOW (ref 3.5–5.0)
Alkaline Phosphatase: 73 U/L (ref 38–126)
Anion gap: 3 — ABNORMAL LOW (ref 5–15)
BUN: 15 mg/dL (ref 8–23)
CO2: 18 mmol/L — ABNORMAL LOW (ref 22–32)
Calcium: 8 mg/dL — ABNORMAL LOW (ref 8.9–10.3)
Chloride: 117 mmol/L — ABNORMAL HIGH (ref 98–111)
Creatinine, Ser: 0.74 mg/dL (ref 0.44–1.00)
GFR, Estimated: >60 mL/min (ref >60)
Glucose, Bld: 101 mg/dL — ABNORMAL HIGH (ref 70–99)
Potassium: 3.6 mmol/L (ref 3.5–5.1)
Sodium: 138 mmol/L (ref 135–145)
Total Bilirubin: 1.8 mg/dL — ABNORMAL HIGH (ref 0.0–1.2)
Total Protein: 4.7 g/dL — ABNORMAL LOW (ref 6.5–8.1)

## 2023-04-02 LAB — CBC WITH DIFFERENTIAL/PLATELET
Abs Immature Granulocytes: 0.02 10*3/uL (ref 0.00–0.07)
Basophils Absolute: 0 10*3/uL (ref 0.0–0.1)
Basophils Relative: 1 %
Eosinophils Absolute: 0.5 10*3/uL (ref 0.0–0.5)
Eosinophils Relative: 8 %
HCT: 30.7 % — ABNORMAL LOW (ref 36.0–46.0)
Hemoglobin: 9.2 g/dL — ABNORMAL LOW (ref 12.0–15.0)
Immature Granulocytes: 0 %
Lymphocytes Relative: 27 %
Lymphs Abs: 1.5 10*3/uL (ref 0.7–4.0)
MCH: 29.3 pg (ref 26.0–34.0)
MCHC: 30 g/dL (ref 30.0–36.0)
MCV: 97.8 fL (ref 80.0–100.0)
Monocytes Absolute: 0.6 10*3/uL (ref 0.1–1.0)
Monocytes Relative: 10 %
Neutro Abs: 3 10*3/uL (ref 1.7–7.7)
Neutrophils Relative %: 54 %
Platelets: 129 10*3/uL — ABNORMAL LOW (ref 150–400)
RBC: 3.14 MIL/uL — ABNORMAL LOW (ref 3.87–5.11)
RDW: 16.9 % — ABNORMAL HIGH (ref 11.5–15.5)
WBC: 5.7 10*3/uL (ref 4.0–10.5)
nRBC: 0 % (ref 0.0–0.2)

## 2023-04-02 LAB — MAGNESIUM: Magnesium: 1.6 mg/dL — ABNORMAL LOW (ref 1.7–2.4)

## 2023-04-02 LAB — PHOSPHORUS: Phosphorus: 3.9 mg/dL (ref 2.5–4.6)

## 2023-04-02 MED ORDER — LEVOTHYROXINE SODIUM 175 MCG PO TABS: 175.0000 ug | ORAL_TABLET | Freq: Every day | ORAL | 0 refills | Status: DC

## 2023-04-02 MED ORDER — MAGNESIUM SULFATE 2 GM/50ML IV SOLN
2.0000 g | Freq: Once | INTRAVENOUS | Status: AC
  Administered 2023-04-02: 2 g via INTRAVENOUS
  Filled 2023-04-02: qty 50

## 2023-04-02 NOTE — Plan of Care (Signed)

## 2023-04-02 NOTE — Evaluation (Signed)
 Physical Therapy One Time Evaluation Patient Details Name: [name] MRN: [id number] DOB: June 14, 1955 Today's Date: 04/02/2023  History of Present Illness  [name] is a 68 y.o. female admitted for acute hepatic encephalopathy, cirrhosis secondary to NASH,  hyperammonemia.   Pt with recent admission 03/07/23 for hepatic encephalopathy. PMH includes decompensated cirrhosis, iron deficiency anemia, anemia of chronic disease, cirrhosis, blind in L eye.  Clinical Impression  Patient evaluated by Physical Therapy with no further acute PT needs identified. All education has been completed and the patient has no further questions.   Pt able to ambulate 120 ft with RW in hallway.  Pt initiated HHPT after last admission however son had an issue with previous agency and would like different one.  He would like pt to be able to perform steps to be able to get out of the home and into community/travel, improve quality of life.  He did say previous company mentioned HH services meant the pt was to be home bound however he would like pt to be able take pt out of home.  He seemed aware that OPPT services may then be indicated.  Follow up PT pending son's decision on HH vs OP.   Recommend pt continue mobilizing with staff and mobility specialist. Acute PT is signing off. Thank you for this referral.         If plan is discharge home, recommend the following: A little help with walking and/or transfers;A little help with bathing/dressing/bathroom;Assistance with cooking/housework   Can travel by private vehicle        Equipment Recommendations None recommended by PT  Recommendations for Other Services       Functional Status Assessment Patient has had a recent decline in their functional status and demonstrates the ability to make significant improvements in function in a reasonable and predictable amount of time.     Precautions / Restrictions Precautions Precautions: Fall      Mobility  Bed  Mobility               General bed mobility comments: pt in recliner    Transfers Overall transfer level: Needs assistance Equipment used: Rolling walker (2 wheels) Transfers: Sit to/from Stand Sit to Stand: Supervision           General transfer comment: verbal cues for hand placement    Ambulation/Gait Ambulation/Gait assistance: Contact guard assist Gait Distance (Feet): 120 Feet Assistive device: Rolling walker (2 wheels) Gait Pattern/deviations: Step-through pattern, Decreased stride length       General Gait Details: verbal cues for RW positioning, BOS (son cued for widening her BOS); no unsteadiness or LOB observed  Stairs            Wheelchair Mobility     Tilt Bed    Modified Rankin (Stroke Patients Only)       Balance Overall balance assessment: Needs assistance         Standing balance support: No upper extremity supported Standing balance-Leahy Scale: Fair Standing balance comment: static fair but dynamic balance poor.                             Pertinent Vitals/Pain Pain Assessment Pain Assessment: Faces Faces Pain Scale: Hurts little more Pain Location: back- chronic Pain Descriptors / Indicators: Discomfort Pain Intervention(s): Repositioned, Monitored during session    Home Living Family/patient expects to be discharged to:: Private residence Living Arrangements: Children (son) Available Help at Discharge: Family;Available  24 hours/day Type of Home: House Home Access: Ramped entrance       Home Layout: One level Home Equipment: Rollator (4 wheels);Rolling Walker (2 wheels);BSC/3in1;Other (comment);Hospital bed;Wheelchair - manual Additional Comments: Pt lives with [name] - very supportive, provides assist as needed    Prior Function Prior Level of Function : Needs assist             Mobility Comments: son typically present for all mobility; uses RW in home, manual WC outside home - he just got  insurance approval for power chair ADLs Comments: Son has been assisting with ADLs lately. Pt states she was doing her own ADL tasks until recently     Extremity/Trunk Assessment        Lower Extremity Assessment Lower Extremity Assessment: Generalized weakness    Cervical / Trunk Assessment Cervical / Trunk Assessment: Kyphotic;Other exceptions Cervical / Trunk Exceptions: history of compression fx per son  Communication   Communication Communication: No apparent difficulties    Cognition Arousal: Alert Behavior During Therapy: WFL for tasks assessed/performed                             Following commands: Intact       Cueing Cueing Techniques: Verbal cues     General Comments      Exercises     Assessment/Plan    PT Assessment All further PT needs can be met in the next venue of care  PT Problem List Decreased strength;Decreased activity tolerance;Decreased balance;Decreased mobility       PT Treatment Interventions      PT Goals (Current goals can be found in the Care Plan section)  Acute Rehab PT Goals PT Goal Formulation: All assessment and education complete, DC therapy    Frequency       Co-evaluation               AM-PAC PT "6 Clicks" Mobility  Outcome Measure Help needed turning from your back to your side while in a flat bed without using bedrails?: A Little Help needed moving from lying on your back to sitting on the side of a flat bed without using bedrails?: A Little Help needed moving to and from a bed to a chair (including a wheelchair)?: A Little Help needed standing up from a chair using your arms (e.g., wheelchair or bedside chair)?: A Little Help needed to walk in hospital room?: A Little Help needed climbing 3-5 steps with a railing? : A Little 6 Click Score: 18    End of Session Equipment Utilized During Treatment: Gait belt Activity Tolerance: Patient tolerated treatment well Patient left: in chair;with call  bell/phone within reach;with family/visitor present   PT Visit Diagnosis: Difficulty in walking, not elsewhere classified (R26.2)    Time: 1610-9604 PT Time Calculation (min) (ACUTE ONLY): 14 min   Charges:   PT Evaluation $PT Eval Low Complexity: 1 Low   PT General Charges $$ ACUTE PT VISIT: 1 Visit        Thomasene Mohair PT, DPT Physical Therapist Acute Rehabilitation Services Office: [phone]   Janan Halter [place] 04/02/2023, 2:32 PM

## 2023-04-02 NOTE — Progress Notes (Signed)
 Stand by assist to bedside commode. Patient tolerated well. Patient refused to get up to chair. Explained why its necessary for patient to eat breakfast sitting up. Patient refused breakfast. Notified [name], [name] DO

## 2023-04-02 NOTE — Progress Notes (Signed)
 Discharge order in. [name], son, states the house it not ready for his mother.  He states he started rearranging last night but can't get to it till tonight again. He states he won't have a place to put her in but on the floor if she was coming today.  Son stated he thought she could stay until Monday.   [name], [name] notified of this conversation.

## 2023-04-02 NOTE — Progress Notes (Signed)
 PROGRESS NOTE    [name]  ZOX:[id number] DOB: 19-Dec-1955 DOA: 03/29/2023 PCP: [name], MD  Chief Complaint  Patient presents with   Altered Mental Status    Hospital Course:  [name] is 68 y.o. female with liver cirrhosis secondary to NASH, hypothyroidism, hyperlipidemia, chronic back pain, GERD, chronic anemia, was brought to the ED after being found increasingly confused.  Per patient's son patient has been taking lower dose of lactulose than prescribed due to increasing bowel movements.  Patient had recent admission from 2/26 through 3/3 for hepatic encephalopathy.  Patient's son reports that she has been following with gastroenterology at [facility] and was recently found that her copper has been at lower levels and was scheduled to test for copper excretion.  Subjective: Patient was able to ambulate to bedside commode today.  She also worked well with physical therapy.  She is back to her mental status baseline.  Objective: Vitals:   04/01/23 1230 04/01/23 2036 04/02/23 0416 04/02/23 1352  BP: (!) 141/70 138/65 (!) 113/58 (!) 120/54  Pulse: 80 73 76 74  Resp: 20 20 18 16   Temp: 98.4 F (36.9 C) 98.1 F (36.7 C) 98.9 F (37.2 C) 97.8 F (36.6 C)  TempSrc: Oral     SpO2: 97% 100% 99% 100%  Weight:      Height:        Intake/Output Summary (Last 24 hours) at 04/02/2023 1823 Last data filed at 04/02/2023 0920 Gross per 24 hour  Intake --  Output 100 ml  Net -100 ml    Filed Weights   03/29/23 1647  Weight: 94.6 kg    Examination: General exam: Appears calm and comfortable, NAD  Respiratory system: No work of breathing, symmetric chest wall expansion Cardiovascular system: S1 & S2 heard, RRR.  Gastrointestinal system: Abdomen is nondistended, soft and nontender.  Neuro: Alert and oriented, does require some prompting for questions.  Appears pleasantly confused at times. Extremities: Symmetric, expected ROM Skin: No rashes, lesions  Assessment &  Plan:  Principal Problem:   Hepatic encephalopathy (HCC) Active Problems:   Cirrhosis due to NASH   Iron deficiency anemia   GERD (gastroesophageal reflux disease)   Thrombocytopenia (HCC)   Hypothyroidism   Short gut syndrome   Pseudophakia of left eye   ARF (acute renal failure) (HCC)   Depression   RLS (restless legs syndrome)    Acute hepatic encephalopathy Cirrhosis secondary to NASH Hyperammonemia - No acute signs of infection --Initial NIH 04/10/1981.  Mental status continues to improve - Continue lactulose, continue rifaximin.  Discussed home regimen with her son. - Patient has proved that she is able to ambulate consistent with her baseline. - MELD score: 17 points, 6.0% estimated 40-month mortality.  AKI -- Appears baseline creatinine is 0.8, elevated to 1.17 on arrival.  Resolved now - Renally dosed with a creatinine clearance of 56 - Avoid nephrotoxic meds when possible - Resume spironolactone and Lasix.  CMP today stable. - Status post 1 dose of albumin  Hypothyroidism - TSH: 6.020, will increase home dose of Synthroid - Was on 150 mcg at home, increased to 175 mcg.  Will need to repeat TSH in clinic in 4 to 6 weeks and continue to titrate as needed-called in new dose to pharmacy  Chronic iron deficiency anemia - Reports she gets IV iron from hematology Continue to follow CBC.  Transfuse if hemoglobin falls below 7.  -- Hemoglobin appears to have downtrended 11>9 since arrival, suspect this is hemodilutional.  Has stabilized now.  No occult blood loss  Thrombocytopenia Coagulopathy - Platelets 135, INR 1.8 - Secondary to cirrhosis as above  Hyperlipidemia - Continue statin  GERD - Continue PPI  Restless leg syndrome - Continue Requip  Depression - Continue Effexor  Chronic back pain - Chronically on hydrocodone, has not taken it in over a week.  -- Zoom Norco as needed  Copper deficiency -- Patient has been seeing outpatient GI and in February  had serum copper level drawn and was found to be 47.  Her son reports she was told to initiate p.o. copper, and has 24-hour urine collection for copper levels. -Repeat serum copper here demonstrates continued deficiency. - On review of hematology notes it does not appear that she has any concern for sideroblastic anemia.  Has known iron deficiency anemia.  DVT prophylaxis: SCDs   Code Status: Limited: Do not attempt resuscitation (DNR) -DNR-LIMITED -Do Not Intubate/DNI  Family Communication: Discussed with patient's son, [name], at the bedside Disposition: Was discharged home today but son has left the hospital and reports he will not be back to get her until tomorrow.   Consultants:    Procedures:    Antimicrobials:  Anti-infectives (From admission, onward)    Start     Dose/Rate Route Frequency Ordered Stop   03/30/23 0145  rifaximin (XIFAXAN) tablet 550 mg        550 mg Oral 2 times daily 03/30/23 0137         Data Reviewed: I have personally reviewed following labs and imaging studies CBC: Recent Labs  Lab 03/29/23 1628 03/30/23 0144 03/30/23 0644 03/31/23 0947 04/02/23 0640  WBC 6.3 5.6 5.2 5.1 5.7  NEUTROABS 3.4  --   --  2.9 3.0  HGB 11.0* 10.3* 9.2* 9.1* 9.2*  HCT 35.7* 33.1* 29.1* 30.1* 30.7*  MCV 94.9 93.5 94.5 96.8 97.8  PLT 153 147* 135* 117* 129*   Basic Metabolic Panel: Recent Labs  Lab 03/29/23 1628 03/30/23 0144 03/30/23 0644 03/31/23 0947 04/02/23 0640  NA 140  --  137 139 138  K 4.0  --  3.5 3.5 3.6  CL 113*  --  112* 118* 117*  CO2 20*  --  20* 17* 18*  GLUCOSE 133*  --  114* 127* 101*  BUN 21  --  22 17 15   CREATININE 1.17* 1.04* 1.07* 0.89 0.74  CALCIUM 8.8*  --  8.4* 8.3* 8.0*  MG  --   --   --   --  1.6*  PHOS  --   --   --   --  3.9   GFR: Estimated Creatinine Clearance: 75.1 mL/min (by C-G formula based on SCr of 0.74 mg/dL). Liver Function Tests: Recent Labs  Lab 03/29/23 1628 03/31/23 0947 04/02/23 0640  AST 37 27 24  ALT  25 19 18   ALKPHOS 96 68 73  BILITOT 1.7* 2.0* 1.8*  PROT 6.3* 4.5* 4.7*  ALBUMIN 2.8* 2.2* 2.3*   CBG: No results for input(s): "GLUCAP" in the last 168 hours.  No results found for this or any previous visit (from the past 240 hours).   Radiology Studies: No results found.  Scheduled Meds:  ascorbic acid  250 mg Oral Daily   atorvastatin  40 mg Oral Daily   copper  2 mg Oral Daily   cyanocobalamin  2,000 mcg Oral Q Fri   famotidine  20 mg Oral Daily   folic acid  1 mg Oral Daily   furosemide  40  mg Oral Daily   heparin  5,000 Units Subcutaneous Q8H   lactulose  30 g Oral TID   levothyroxine  175 mcg Oral Q0600   megestrol  40 mg Oral BID   midodrine  10 mg Oral TID WC   pantoprazole  40 mg Oral BID AC   rifaximin  550 mg Oral BID   rOPINIRole  0.25 mg Oral QHS   spironolactone  50 mg Oral Daily   tamsulosin  0.4 mg Oral Daily   venlafaxine XR  75 mg Oral Q breakfast   Continuous Infusions:   LOS: 0 days  MDM: Patient is high risk for one or more organ failure.  They necessitate ongoing hospitalization for continued IV therapies and subsequent lab monitoring. Total time spent interpreting labs and vitals, coordinating care amongst consultants and care team members, directly assessing and discussing care with the patient and/or family: 55 min   Debarah Crape, DO [facility]  To contact the attending physician between 7A-7P please use Epic Chat. To contact the covering physician during after hours 7P-7A, please review Amion.   04/02/2023, 6:23 PM   *This document has been created with the assistance of dictation software. Please excuse typographical errors. *

## 2023-04-03 DIAGNOSIS — D696 Thrombocytopenia, unspecified: Secondary | ICD-10-CM

## 2023-04-03 DIAGNOSIS — G2581 Restless legs syndrome: Secondary | ICD-10-CM

## 2023-04-03 DIAGNOSIS — K219 Gastro-esophageal reflux disease without esophagitis: Secondary | ICD-10-CM | POA: Diagnosis not present

## 2023-04-03 DIAGNOSIS — D509 Iron deficiency anemia, unspecified: Secondary | ICD-10-CM | POA: Diagnosis not present

## 2023-04-03 DIAGNOSIS — Z961 Presence of intraocular lens: Secondary | ICD-10-CM | POA: Diagnosis not present

## 2023-04-03 DIAGNOSIS — K7460 Unspecified cirrhosis of liver: Secondary | ICD-10-CM | POA: Diagnosis not present

## 2023-04-03 DIAGNOSIS — K90829 Short bowel syndrome, unspecified: Secondary | ICD-10-CM | POA: Diagnosis not present

## 2023-04-03 DIAGNOSIS — F32A Depression, unspecified: Secondary | ICD-10-CM

## 2023-04-03 DIAGNOSIS — N179 Acute kidney failure, unspecified: Secondary | ICD-10-CM

## 2023-04-03 DIAGNOSIS — K7682 Hepatic encephalopathy: Secondary | ICD-10-CM | POA: Diagnosis not present

## 2023-04-03 DIAGNOSIS — A419 Sepsis, unspecified organism: Secondary | ICD-10-CM | POA: Diagnosis not present

## 2023-04-03 DIAGNOSIS — E039 Hypothyroidism, unspecified: Secondary | ICD-10-CM | POA: Diagnosis not present

## 2023-04-03 DIAGNOSIS — A4150 Gram-negative sepsis, unspecified: Secondary | ICD-10-CM | POA: Diagnosis not present

## 2023-04-03 LAB — COMPREHENSIVE METABOLIC PANEL
ALT: 17 U/L (ref 0–44)
AST: 24 U/L (ref 15–41)
Albumin: 2.1 g/dL — ABNORMAL LOW (ref 3.5–5.0)
Alkaline Phosphatase: 71 U/L (ref 38–126)
Anion gap: 4 — ABNORMAL LOW (ref 5–15)
BUN: 14 mg/dL (ref 8–23)
CO2: 18 mmol/L — ABNORMAL LOW (ref 22–32)
Calcium: 7.8 mg/dL — ABNORMAL LOW (ref 8.9–10.3)
Chloride: 116 mmol/L — ABNORMAL HIGH (ref 98–111)
Creatinine, Ser: 0.85 mg/dL (ref 0.44–1.00)
GFR, Estimated: >60 mL/min (ref >60)
Glucose, Bld: 111 mg/dL — ABNORMAL HIGH (ref 70–99)
Potassium: 3.7 mmol/L (ref 3.5–5.1)
Sodium: 138 mmol/L (ref 135–145)
Total Bilirubin: 1.7 mg/dL — ABNORMAL HIGH (ref 0.0–1.2)
Total Protein: 4.5 g/dL — ABNORMAL LOW (ref 6.5–8.1)

## 2023-04-03 LAB — CBC WITH DIFFERENTIAL/PLATELET
Abs Immature Granulocytes: 0.01 10*3/uL (ref 0.00–0.07)
Basophils Absolute: 0 10*3/uL (ref 0.0–0.1)
Basophils Relative: 1 %
Eosinophils Absolute: 0.5 10*3/uL (ref 0.0–0.5)
Eosinophils Relative: 10 %
HCT: 30.4 % — ABNORMAL LOW (ref 36.0–46.0)
Hemoglobin: 9 g/dL — ABNORMAL LOW (ref 12.0–15.0)
Immature Granulocytes: 0 %
Lymphocytes Relative: 26 %
Lymphs Abs: 1.3 10*3/uL (ref 0.7–4.0)
MCH: 29.1 pg (ref 26.0–34.0)
MCHC: 29.6 g/dL — ABNORMAL LOW (ref 30.0–36.0)
MCV: 98.4 fL (ref 80.0–100.0)
Monocytes Absolute: 0.5 10*3/uL (ref 0.1–1.0)
Monocytes Relative: 10 %
Neutro Abs: 2.7 10*3/uL (ref 1.7–7.7)
Neutrophils Relative %: 53 %
Platelets: 105 10*3/uL — ABNORMAL LOW (ref 150–400)
RBC: 3.09 MIL/uL — ABNORMAL LOW (ref 3.87–5.11)
RDW: 16.7 % — ABNORMAL HIGH (ref 11.5–15.5)
WBC: 5 10*3/uL (ref 4.0–10.5)
nRBC: 0 % (ref 0.0–0.2)

## 2023-04-03 LAB — MAGNESIUM: Magnesium: 1.8 mg/dL (ref 1.7–2.4)

## 2023-04-03 LAB — PHOSPHORUS: Phosphorus: 3.8 mg/dL (ref 2.5–4.6)

## 2023-04-03 NOTE — Discharge Summary (Signed)
 Physician Discharge Summary   Patient: [name] MRN: [id number] DOB: 1955/05/09  Admit date:     03/29/2023  Discharge date: 04/03/23  Discharge Physician: Debarah Crape   PCP: [name], MD   Recommendations at discharge:   Continue outpatient follow-up with GI Follow-up with primary care physician for chronic medical problems and TSH check in 4 weeks  Discharge Diagnoses: Principal Problem:   Hepatic encephalopathy (HCC) Active Problems:   Cirrhosis due to NASH   Iron deficiency anemia   GERD (gastroesophageal reflux disease)   Thrombocytopenia (HCC)   Hypothyroidism   Short gut syndrome   Pseudophakia of left eye   ARF (acute renal failure) (HCC)   Depression   RLS (restless legs syndrome)  Resolved Problems:   * No resolved hospital problems. [facility] Course: 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  Patient's son reports that he has been giving her lower dose of lactulose due to increasing bowel movements at home.  Patient has had prior admissions for hepatic encephalopathy.  She was admitted and started on lactulose and rifaximin.  Gradually her encephalopathy resolved to baseline.  Patient had persistent weakness which prolonged her length of stay.  By evaluation on 3/23 patient was back to her mental and physical baseline.  She was discharged on 3/23 but caregiver was unable to pick her up from the hospital.  She is discharging home today with her son [name].     Acute hepatic encephalopathy Cirrhosis secondary to NASH Hyperammonemia - No acute signs of infection --Initial Ammonia 183 .  Mental status continues to improve - Continue lactulose, continue rifaximin.  Discussed home regimen with her son. - Patient has proved that she is able to ambulate consistent with her  baseline. - MELD score: 17 points, 6.0% estimated 27-month mortality.   AKI -- Appears baseline creatinine is 0.8, elevated to 1.17 on arrival.  Resolved now - Avoid nephrotoxic meds when possible - Resume spironolactone and Lasix.  CMP today stable. - Status post 1 dose of albumin   Hypothyroidism - TSH: 6.020, will increase home dose of Synthroid - Was on 150 mcg at home, increased to 175 mcg.  Will need to repeat TSH in clinic in 4 to 6 weeks and continue to titrate as needed-called in new dose to pharmacy   Chronic iron deficiency anemia - Reports she gets IV iron from hematology -- Hemoglobin appears to have downtrended 11>9 since arrival, suspect this is hemodilutional.  Has stabilized now.  No occult blood loss   Thrombocytopenia Coagulopathy - Platelets 135, INR 1.8 - Secondary to cirrhosis as above   Hyperlipidemia - Continue statin   GERD - Continue PPI   Restless leg syndrome - Continue Requip   Depression - Continue Effexor   Chronic back pain - Chronically on hydrocodone, has not taken it in over a week.  -- Zoom Norco as needed   Copper deficiency -- Patient has been seeing outpatient GI and in February had serum copper level drawn and was found to be 47.  Her son reports she was told to initiate p.o. copper, and has order for 24-hour urine collection for copper levels. -Repeat serum copper here demonstrates continued deficiency. - On review of hematology notes it does not appear that she has any concern for sideroblastic anemia.  Has known iron deficiency anemia.  Diet recommendation:  Discharge Diet Orders (From admission, onward)     Start     Ordered   04/03/23 0000  Diet general        04/03/23 0914   04/02/23 0000  Diet general        04/02/23 1645           Regular diet DISCHARGE MEDICATION: Allergies as of 04/03/2023       Reactions   Ace Inhibitors Anaphylaxis, Swelling   Ivp Dye [iodinated Contrast Media] Anaphylaxis   Desitin  [zinc Oxide] Rash, Other (See Comments)   Worsens rash   Gadolinium Derivatives Other (See Comments)   Reaction??   Hydroxyzine Other (See Comments)   Affects the mind   Chlorhexidine Rash, Other (See Comments)   WORSENS RASHES   Doxycycline Rash   Penicillins Rash   Zofran [ondansetron] Rash        Medication List     STOP taking these medications    TobraDex ophthalmic ointment Generic drug: tobramycin-dexamethasone       TAKE these medications    albuterol 108 (90 Base) MCG/ACT inhaler Commonly known as: VENTOLIN HFA Inhale 2 puffs into the lungs every 6 (six) hours as needed for shortness of breath or wheezing.   atorvastatin 40 MG tablet Commonly known as: LIPITOR Take 1 tablet (40 mg total) by mouth in the morning.   CENTRUM SILVER ULTRA WOMENS PO Take 1 tablet by mouth in the morning.   copper tablet Take 2 mg by mouth in the morning and at bedtime.   cyanocobalamin 1000 MCG tablet Commonly known as: VITAMIN B12 Take 2,000 mcg by mouth every Friday.   dicyclomine 10 MG capsule Commonly known as: BENTYL Take 1 capsule (10 mg total) by mouth 3 (three) times daily as needed for spasms (abdominal cramping).   famotidine 20 MG tablet Commonly known as: PEPCID Take 20 mg by mouth in the morning.   folic acid 1 MG tablet Commonly known as: FOLVITE Take 1 tablet (1 mg total) by mouth daily.   furosemide 40 MG tablet Commonly known as: LASIX Take 1 tablet (40 mg total) by mouth daily. Take additional 40 mg if there is a weight gain of 3 lbs in1 day or 5 lbs in 1 week.   HYDROcodone-acetaminophen 10-325 MG tablet Commonly known as: NORCO Take 1 tablet by mouth every 8 (eight) hours as needed for moderate pain (pain score 4-6).   IRON-VITAMIN C PO Take 1 tablet by mouth daily with breakfast.   lactulose 10 GM/15ML solution Commonly known as: CHRONULAC Take 30 mLs (20 g total) by mouth 3 (three) times daily. Take to ensure that there is 2 loose bowel  movement EVERY DAY What changed:  when to take this reasons to take this additional instructions   levothyroxine 150 MCG tablet Commonly known as: SYNTHROID Take 150 mcg by mouth daily before breakfast. What changed: Another medication with the same name was added. Make sure you understand how and when to take each.   levothyroxine 175 MCG tablet Commonly known as: SYNTHROID Take 1 tablet (175 mcg total) by mouth daily at 6 (six) AM. Recheck TSH with PCP in 4 weeks What changed: You were already taking a medication with the same name, and this prescription was added. Make sure you understand how and when to take each.   lidocaine 5 % Commonly known as: LIDODERM Place 1 patch onto the skin daily. Remove & Discard patch within 12 hours or as directed  by MD What changed:  when to take this reasons to take this additional instructions   magnesium gluconate 500 (27 Mg) MG Tabs tablet Commonly known as: MAGONATE Take 500 mg by mouth in the morning and at bedtime.   megestrol 40 MG tablet Commonly known as: MEGACE Take 1 tablet (40 mg total) by mouth 2 (two) times daily.   methocarbamol 500 MG tablet Commonly known as: ROBAXIN Take 500 mg by mouth in the morning.   midodrine 10 MG tablet Commonly known as: PROAMATINE Take 1 tablet (10 mg total) by mouth 3 (three) times daily with meals.   naloxone 4 MG/0.1ML Liqd nasal spray kit Commonly known as: NARCAN Place 1 spray into the nose as needed (accidental overdose).   nystatin cream Commonly known as: MYCOSTATIN Apply topically 2 (two) times daily.   pantoprazole 40 MG tablet Commonly known as: PROTONIX Take 1 tablet (40 mg total) by mouth 2 (two) times daily before a meal.   potassium chloride 10 MEQ tablet Commonly known as: KLOR-CON Take 20 mEq by mouth 2 (two) times daily.   promethazine 12.5 MG tablet Commonly known as: PHENERGAN Take 12.5 mg by mouth every 6 (six) hours as needed for nausea or vomiting.    rifaximin 550 MG Tabs tablet Commonly known as: XIFAXAN Take 550 mg by mouth 2 (two) times daily.   rOPINIRole 0.25 MG tablet Commonly known as: REQUIP Take 1 tablet (0.25 mg total) by mouth at bedtime. What changed: when to take this   spironolactone 50 MG tablet Commonly known as: ALDACTONE Take 1 tablet (50 mg total) by mouth daily.   tamsulosin 0.4 MG Caps capsule Commonly known as: FLOMAX Take 1 capsule (0.4 mg total) by mouth daily.   triamcinolone cream 0.5 % Commonly known as: KENALOG Apply to affected area of right breast twice daily as directed. What changed:  how much to take when to take this reasons to take this   venlafaxine XR 75 MG 24 hr capsule Commonly known as: EFFEXOR-XR Take 75 mg by mouth daily with breakfast.   Vitamin D (Ergocalciferol) 1.25 MG (50000 UNIT) Caps capsule Commonly known as: DRISDOL Take 50,000 Units by mouth every Wednesday.        Discharge Exam: Filed Weights   03/29/23 1647  Weight: 94.6 kg   Constitutional:  Normal appearance. Non toxic-appearing.  Obese. HENT: Head Normocephalic and atraumatic.  Mucous membranes are moist.  Eyes:  Extraocular intact. Conjunctivae normal. Pupils are equal, round, and reactive to light.  Cardiovascular: Rate and Rhythm: Normal rate and regular rhythm.  Pulmonary: Non labored, symmetric rise of chest wall.  Musculoskeletal:  Normal range of motion.  Skin: warm and dry. not jaundiced.  Neurological: No focal deficit present. alert. Oriented x3. Psychiatric: Mood and Affect congruent.    Condition at discharge: stable  The results of significant diagnostics from this hospitalization (including imaging, microbiology, ancillary and laboratory) are listed below for reference.   Imaging Studies: DG Chest Portable 1 View Result Date: 03/07/2023 CLINICAL DATA:  Altered mental status. EXAM: PORTABLE CHEST 1 VIEW COMPARISON:  X-ray 03/04/2023 FINDINGS: Under penetrated radiograph with  underinflation. Stable cardiopericardial silhouette with calcified tortuous aorta. No consolidation, pneumothorax or effusion. No edema. Overlapping gown snaps. IMPRESSION: Limited radiograph.  Grossly no acute cardiopulmonary disease. Electronically Signed   By: Karen Kays M.D.   On: 03/07/2023 17:36   CT HEAD WO CONTRAST ( ) Result Date: 03/05/2023 CLINICAL DATA:  Delirium/altered mental status, cirrhosis with elevated ammonia levels EXAM:  CT HEAD WITHOUT CONTRAST TECHNIQUE: Contiguous axial images were obtained from the base of the skull through the vertex without intravenous contrast. RADIATION DOSE REDUCTION: This exam was performed according to the departmental dose-optimization program which includes automated exposure control, adjustment of the mA and/or kV according to patient size and/or use of iterative reconstruction technique. COMPARISON:  02/06/2023 FINDINGS: Brain: No evidence of acute infarction, hemorrhage, hydrocephalus, extra-axial collection or mass lesion/mass effect. Subcortical white matter and periventricular small vessel ischemic changes. Vascular: Intracranial atherosclerosis. Skull: Normal. Negative for fracture or focal lesion. Sinuses/Orbits: Partial opacification the right maxillary sinus. Visualized paranasal sinuses and mastoid air cells are otherwise clear. Other: None. IMPRESSION: No acute intracranial abnormality. Small vessel ischemic changes. Electronically Signed   By: Charline Bills M.D.   On: 03/05/2023 02:32   DG Chest Portable 1 View Result Date: 03/04/2023 CLINICAL DATA:  Altered mental status. EXAM: PORTABLE CHEST 1 VIEW COMPARISON:  02/06/2023 FINDINGS: Cardiomediastinal contours are within normal limits. Peribronchial thickening and interstitial prominence. No effusions or acute bony abnormality. IMPRESSION: Peribronchial thickening and interstitial prominence could reflect bronchitis or atypical infection. Electronically Signed   By: Charlett Nose M.D.   On:  03/04/2023 23:11    Microbiology: Results for orders placed or performed during the hospital encounter of 03/04/23  Resp panel by RT-PCR (RSV, Flu A&B, Covid) Anterior Nasal Swab     Status: None   Collection Time: 03/04/23 10:08 PM   Specimen: Anterior Nasal Swab  Result Value Ref Range Status   SARS Coronavirus 2 by RT PCR NEGATIVE NEGATIVE Final    Comment: (NOTE) SARS-CoV-2 target nucleic acids are NOT DETECTED.  The SARS-CoV-2 RNA is generally detectable in upper respiratory specimens during the acute phase of infection. The lowest concentration of SARS-CoV-2 viral copies this assay can detect is 138 copies/mL. A negative result does not preclude SARS-Cov-2 infection and should not be used as the sole basis for treatment or other patient management decisions. A negative result may occur with  improper specimen collection/handling, submission of specimen other than nasopharyngeal swab, presence of viral mutation(s) within the areas targeted by this assay, and inadequate number of viral copies(<138 copies/mL). A negative result must be combined with clinical observations, patient history, and epidemiological information. The expected result is Negative.  Fact Sheet for Patients:  BloggerCourse.com  Fact Sheet for Healthcare Providers:  SeriousBroker.it  This test is no t yet approved or cleared by the [place] FDA and  has been authorized for detection and/or diagnosis of SARS-CoV-2 by FDA under an Emergency Use Authorization (EUA). This EUA will remain  in effect (meaning this test can be used) for the duration of the COVID-19 declaration under Section 564(b)(1) of the Act, 21 U.S.C.section 360bbb-3(b)(1), unless the authorization is terminated  or revoked sooner.       Influenza A by PCR NEGATIVE NEGATIVE Final   Influenza B by PCR NEGATIVE NEGATIVE Final    Comment: (NOTE) The Xpert Xpress SARS-CoV-2/FLU/RSV plus  assay is intended as an aid in the diagnosis of influenza from Nasopharyngeal swab specimens and should not be used as a sole basis for treatment. Nasal washings and aspirates are unacceptable for Xpert Xpress SARS-CoV-2/FLU/RSV testing.  Fact Sheet for Patients: BloggerCourse.com  Fact Sheet for Healthcare Providers: SeriousBroker.it  This test is not yet approved or cleared by the [place] FDA and has been authorized for detection and/or diagnosis of SARS-CoV-2 by FDA under an Emergency Use Authorization (EUA). This EUA will remain in effect (meaning this  test can be used) for the duration of the COVID-19 declaration under Section 564(b)(1) of the Act, 21 U.S.C. section 360bbb-3(b)(1), unless the authorization is terminated or revoked.     Resp Syncytial Virus by PCR NEGATIVE NEGATIVE Final    Comment: (NOTE) Fact Sheet for Patients: BloggerCourse.com  Fact Sheet for Healthcare Providers: SeriousBroker.it  This test is not yet approved or cleared by the [place] FDA and has been authorized for detection and/or diagnosis of SARS-CoV-2 by FDA under an Emergency Use Authorization (EUA). This EUA will remain in effect (meaning this test can be used) for the duration of the COVID-19 declaration under Section 564(b)(1) of the Act, 21 U.S.C. section 360bbb-3(b)(1), unless the authorization is terminated or revoked.  Performed at [facility], [address] [place], [place] [address]   Blood culture (routine x 2)     Status: None   Collection Time: 03/04/23 11:22 PM   Specimen: BLOOD  Result Value Ref Range Status   Specimen Description   Final    BLOOD RIGHT ANTECUBITAL Performed at [facility], [address] [place], [place] [address]    Special Requests   Final    BOTTLES DRAWN AEROBIC AND ANAEROBIC Blood Culture adequate  volume Performed at [facility], [address], [place], [place] [address]    Culture   Final    NO GROWTH 5 DAYS Performed at [facility], [address] [place], [place] [address]    Report Status 03/10/2023 FINAL  Final  Blood culture (routine x 2)     Status: None   Collection Time: 03/04/23 11:30 PM   Specimen: BLOOD RIGHT ARM  Result Value Ref Range Status   Specimen Description   Final    BLOOD RIGHT ARM Performed at [facility], [address], [place], [place] [address]    Special Requests   Final    BOTTLES DRAWN AEROBIC AND ANAEROBIC Blood Culture results may not be optimal due to an inadequate volume of blood received in culture bottles Performed at [facility], [address], [place], [place] [address]    Culture   Final    NO GROWTH 5 DAYS Performed at [facility], [address], [place], [place] [address]    Report Status 03/10/2023 FINAL  Final    Labs: CBC: Recent Labs  Lab 03/29/23 1628 03/30/23 0144 03/30/23 0644 03/31/23 0947 04/02/23 0640 04/03/23 0506  WBC 6.3 5.6 5.2 5.1 5.7 5.0  NEUTROABS 3.4  --   --  2.9 3.0 2.7  HGB 11.0* 10.3* 9.2* 9.1* 9.2* 9.0*  HCT 35.7* 33.1* 29.1* 30.1* 30.7* 30.4*  MCV 94.9 93.5 94.5 96.8 97.8 98.4  PLT 153 147* 135* 117* 129* 105*   Basic Metabolic Panel: Recent Labs  Lab 03/29/23 1628 03/30/23 0144 03/30/23 0644 03/31/23 0947 04/02/23 0640 04/03/23 0506  NA 140  --  137 139 138 138  K 4.0  --  3.5 3.5 3.6 3.7  CL 113*  --  112* 118* 117* 116*  CO2 20*  --  20* 17* 18* 18*  GLUCOSE 133*  --  114* 127* 101* 111*  BUN 21  --  22 17 15 14   CREATININE 1.17* 1.04* 1.07* 0.89 0.74 0.85  CALCIUM 8.8*  --  8.4* 8.3* 8.0* 7.8*  MG  --   --   --   --  1.6* 1.8  PHOS  --   --   --   --  3.9 3.8   Liver Function Tests: Recent Labs  Lab 03/29/23 1628 03/31/23 0947 04/02/23 0640 04/03/23 0506  AST 37 27 24 24   ALT 25 19 18 17   ALKPHOS 96 68 73  71  BILITOT 1.7* 2.0* 1.8* 1.7*  PROT 6.3* 4.5* 4.7* 4.5*  ALBUMIN 2.8* 2.2* 2.3* 2.1*   CBG: No results for input(s): "GLUCAP" in the last 168 hours.  Discharge time spent: 31 minutes.  Signed: Debarah Crape, DO [facility] 04/03/2023

## 2023-04-03 NOTE — Hospital Course (Addendum)
 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  Patient's son reports that he has been giving her lower dose of lactulose due to increasing bowel movements at home.  Patient has had prior admissions for hepatic encephalopathy.  She was admitted and started on lactulose and rifaximin.  Gradually her encephalopathy resolved to baseline.  Patient had persistent weakness which prolonged her length of stay.  By evaluation on 3/23 patient was back to her mental and physical baseline.  She was discharged on 3/23 but caregiver was unable to pick her up from the hospital.  She is discharging home today with her son [name].     Acute hepatic encephalopathy Cirrhosis secondary to NASH Hyperammonemia - No acute signs of infection --Initial Ammonia 183 .  Mental status continues to improve - Continue lactulose, continue rifaximin.  Discussed home regimen with her son. - Patient has proved that she is able to ambulate consistent with her baseline. - MELD score: 17 points, 6.0% estimated 6-month mortality.   AKI -- Appears baseline creatinine is 0.8, elevated to 1.17 on arrival.  Resolved now - Avoid nephrotoxic meds when possible - Resume spironolactone and Lasix.  CMP today stable. - Status post 1 dose of albumin   Hypothyroidism - TSH: 6.020, will increase home dose of Synthroid - Was on 150 mcg at home, increased to 175 mcg.  Will need to repeat TSH in clinic in 4 to 6 weeks and continue to titrate as needed-called in new dose to pharmacy   Chronic iron deficiency anemia - Reports she gets IV iron from hematology -- Hemoglobin appears to have downtrended 11>9 since arrival, suspect this is hemodilutional.  Has stabilized now.  No occult blood loss   Thrombocytopenia Coagulopathy - Platelets 135, INR 1.8 - Secondary to  cirrhosis as above   Hyperlipidemia - Continue statin   GERD - Continue PPI   Restless leg syndrome - Continue Requip   Depression - Continue Effexor   Chronic back pain - Chronically on hydrocodone, has not taken it in over a week.  -- Zoom Norco as needed   Copper deficiency -- Patient has been seeing outpatient GI and in February had serum copper level drawn and was found to be 47.  Her son reports she was told to initiate p.o. copper, and has order for 24-hour urine collection for copper levels. -Repeat serum copper here demonstrates continued deficiency. - On review of hematology notes it does not appear that she has any concern for sideroblastic anemia.  Has known iron deficiency anemia.

## 2023-04-03 NOTE — Plan of Care (Addendum)
 VSS. Patient given PRN Norco for neck pain. LBM 3/23. Patient ambulating to [facility] x1 assist. No acute events overnight.  Problem: Education: Goal: Knowledge of General Education information will improve Description: Including pain rating scale, medication(s)/side effects and non-pharmacologic comfort measures Outcome: Progressing   Problem: Clinical Measurements: Goal: Ability to maintain clinical measurements within normal limits will improve Outcome: Progressing Goal: Will remain free from infection Outcome: Progressing   Problem: Activity: Goal: Risk for activity intolerance will decrease Outcome: Progressing   Problem: Nutrition: Goal: Adequate nutrition will be maintained Outcome: Progressing   Problem: Pain Managment: Goal: General experience of comfort will improve and/or be controlled Outcome: Progressing   Problem: Safety: Goal: Ability to remain free from injury will improve Outcome: Progressing   Problem: Skin Integrity: Goal: Risk for impaired skin integrity will decrease Outcome: Progressing

## 2023-04-03 NOTE — TOC Transition Note (Signed)
 Transition of Care [facility]) - Discharge Note   Patient Details  Name: [name] MRN: [id number] Date of Birth: [birth year]  [facility]) CM/SW Contact:  Otelia Santee, LCSW Phone Number: 04/03/2023, 9:46 AM   Clinical Narrative:    Pt to return home with son. Pt to continue HHPT w/ [facility]. HH order placed. Son to transport home at discharge. No further TOC needs identified.    Final next level of care: Home w Home Health Services Barriers to Discharge: No Barriers Identified   Patient Goals and CMS Choice Patient states their goals for this hospitalization and ongoing recovery are:: For pt to return home CMS Medicare.gov Compare Post Acute Care list provided to:: Patient Represenative (must comment) Choice offered to / list presented to : Adult Children  ownership interest in [facility].provided to::  (NA)    Discharge Placement                       Discharge Plan and Services Additional resources added to the After Visit Summary for   In-house Referral: NA Discharge Planning Services: NA Post Acute Care Choice: Home Health, Resumption of Svcs/PTA Provider          DME Arranged: N/A DME Agency: NA                  Social Drivers of Health (SDOH) Interventions SDOH Screenings   Food Insecurity: No Food Insecurity (04/01/2023)  Housing: Low Risk  (03/31/2023)  Transportation Needs: No Transportation Needs (03/31/2023)  Utilities: Not At Risk (03/31/2023)  Depression (PHQ2-9): Medium Risk (03/06/2023)  Social Connections: Socially Isolated (03/31/2023)  Tobacco Use: Medium Risk (04/01/2023)     Readmission Risk Interventions    03/09/2023   11:21 AM 10/19/2022   10:12 AM 08/15/2022    3:20 PM  Readmission Risk Prevention Plan  Transportation Screening Complete Complete Complete  Medication Review Oceanographer) Complete Complete Complete  PCP or Specialist appointment within 3-5 days of discharge Complete Complete  Complete  HRI or Home Care Consult Complete Complete Complete  SW Recovery Care/Counseling Consult Complete Complete Complete  Palliative Care Screening Not Applicable Not Applicable Not Applicable  Skilled Nursing Facility Not Applicable Not Applicable Not Applicable

## 2023-04-05 ENCOUNTER — Encounter (HOSPITAL_COMMUNITY): Payer: Self-pay

## 2023-04-05 ENCOUNTER — Emergency Department (HOSPITAL_COMMUNITY): Payer: Medicare (Managed Care)

## 2023-04-05 ENCOUNTER — Other Ambulatory Visit: Payer: Self-pay

## 2023-04-05 ENCOUNTER — Inpatient Hospital Stay (HOSPITAL_COMMUNITY)
Admission: EM | Admit: 2023-04-05 | Discharge: 2023-04-14 | DRG: 872 | Disposition: A | Discharge status: Home Health | Payer: Medicare (Managed Care) | Attending: Internal Medicine | Admitting: Internal Medicine

## 2023-04-05 DIAGNOSIS — K7460 Unspecified cirrhosis of liver: Secondary | ICD-10-CM | POA: Diagnosis present

## 2023-04-05 DIAGNOSIS — N939 Abnormal uterine and vaginal bleeding, unspecified: Secondary | ICD-10-CM | POA: Diagnosis present

## 2023-04-05 DIAGNOSIS — Z8249 Family history of ischemic heart disease and other diseases of the circulatory system: Secondary | ICD-10-CM

## 2023-04-05 DIAGNOSIS — E039 Hypothyroidism, unspecified: Secondary | ICD-10-CM | POA: Diagnosis present

## 2023-04-05 DIAGNOSIS — R7989 Other specified abnormal findings of blood chemistry: Secondary | ICD-10-CM

## 2023-04-05 DIAGNOSIS — Z880 Allergy status to penicillin: Secondary | ICD-10-CM

## 2023-04-05 DIAGNOSIS — Z8619 Personal history of other infectious and parasitic diseases: Secondary | ICD-10-CM

## 2023-04-05 DIAGNOSIS — R410 Disorientation, unspecified: Secondary | ICD-10-CM | POA: Diagnosis present

## 2023-04-05 DIAGNOSIS — Z66 Do not resuscitate: Secondary | ICD-10-CM | POA: Diagnosis present

## 2023-04-05 DIAGNOSIS — Z808 Family history of malignant neoplasm of other organs or systems: Secondary | ICD-10-CM

## 2023-04-05 DIAGNOSIS — Z881 Allergy status to other antibiotic agents status: Secondary | ICD-10-CM

## 2023-04-05 DIAGNOSIS — R739 Hyperglycemia, unspecified: Secondary | ICD-10-CM

## 2023-04-05 DIAGNOSIS — Z1623 Resistance to quinolones and fluoroquinolones: Secondary | ICD-10-CM | POA: Diagnosis present

## 2023-04-05 DIAGNOSIS — Z79890 Hormone replacement therapy (postmenopausal): Secondary | ICD-10-CM

## 2023-04-05 DIAGNOSIS — Z79899 Other long term (current) drug therapy: Secondary | ICD-10-CM

## 2023-04-05 DIAGNOSIS — I10 Essential (primary) hypertension: Secondary | ICD-10-CM | POA: Diagnosis present

## 2023-04-05 DIAGNOSIS — Z87891 Personal history of nicotine dependence: Secondary | ICD-10-CM

## 2023-04-05 DIAGNOSIS — K529 Noninfective gastroenteritis and colitis, unspecified: Secondary | ICD-10-CM | POA: Diagnosis present

## 2023-04-05 DIAGNOSIS — K7030 Alcoholic cirrhosis of liver without ascites: Secondary | ICD-10-CM | POA: Diagnosis present

## 2023-04-05 DIAGNOSIS — R6520 Severe sepsis without septic shock: Secondary | ICD-10-CM | POA: Diagnosis present

## 2023-04-05 DIAGNOSIS — E785 Hyperlipidemia, unspecified: Secondary | ICD-10-CM | POA: Diagnosis present

## 2023-04-05 DIAGNOSIS — Z8049 Family history of malignant neoplasm of other genital organs: Secondary | ICD-10-CM

## 2023-04-05 DIAGNOSIS — K7581 Nonalcoholic steatohepatitis (NASH): Secondary | ICD-10-CM | POA: Diagnosis present

## 2023-04-05 DIAGNOSIS — Z86718 Personal history of other venous thrombosis and embolism: Secondary | ICD-10-CM

## 2023-04-05 DIAGNOSIS — K439 Ventral hernia without obstruction or gangrene: Secondary | ICD-10-CM | POA: Diagnosis present

## 2023-04-05 DIAGNOSIS — E66812 Obesity, class 2: Secondary | ICD-10-CM | POA: Diagnosis present

## 2023-04-05 DIAGNOSIS — E119 Type 2 diabetes mellitus without complications: Secondary | ICD-10-CM | POA: Diagnosis present

## 2023-04-05 DIAGNOSIS — F32A Depression, unspecified: Secondary | ICD-10-CM | POA: Diagnosis present

## 2023-04-05 DIAGNOSIS — W5503XA Scratched by cat, initial encounter: Secondary | ICD-10-CM

## 2023-04-05 DIAGNOSIS — K90829 Short bowel syndrome, unspecified: Secondary | ICD-10-CM | POA: Diagnosis present

## 2023-04-05 DIAGNOSIS — Z833 Family history of diabetes mellitus: Secondary | ICD-10-CM

## 2023-04-05 DIAGNOSIS — Z91041 Radiographic dye allergy status: Secondary | ICD-10-CM

## 2023-04-05 DIAGNOSIS — Z86711 Personal history of pulmonary embolism: Secondary | ICD-10-CM

## 2023-04-05 DIAGNOSIS — E872 Acidosis, unspecified: Secondary | ICD-10-CM | POA: Diagnosis present

## 2023-04-05 DIAGNOSIS — N179 Acute kidney failure, unspecified: Secondary | ICD-10-CM | POA: Diagnosis present

## 2023-04-05 DIAGNOSIS — D6959 Other secondary thrombocytopenia: Secondary | ICD-10-CM | POA: Diagnosis present

## 2023-04-05 DIAGNOSIS — L03115 Cellulitis of right lower limb: Principal | ICD-10-CM | POA: Insufficient documentation

## 2023-04-05 DIAGNOSIS — D649 Anemia, unspecified: Secondary | ICD-10-CM | POA: Diagnosis present

## 2023-04-05 DIAGNOSIS — K219 Gastro-esophageal reflux disease without esophagitis: Secondary | ICD-10-CM | POA: Diagnosis present

## 2023-04-05 DIAGNOSIS — E669 Obesity, unspecified: Secondary | ICD-10-CM | POA: Diagnosis present

## 2023-04-05 DIAGNOSIS — L0390 Cellulitis, unspecified: Secondary | ICD-10-CM | POA: Insufficient documentation

## 2023-04-05 DIAGNOSIS — A4150 Gram-negative sepsis, unspecified: Principal | ICD-10-CM | POA: Diagnosis present

## 2023-04-05 DIAGNOSIS — H16072 Perforated corneal ulcer, left eye: Secondary | ICD-10-CM | POA: Diagnosis present

## 2023-04-05 DIAGNOSIS — A419 Sepsis, unspecified organism: Secondary | ICD-10-CM | POA: Diagnosis present

## 2023-04-05 DIAGNOSIS — D696 Thrombocytopenia, unspecified: Secondary | ICD-10-CM | POA: Diagnosis present

## 2023-04-05 DIAGNOSIS — G2581 Restless legs syndrome: Secondary | ICD-10-CM | POA: Diagnosis present

## 2023-04-05 DIAGNOSIS — D689 Coagulation defect, unspecified: Secondary | ICD-10-CM | POA: Diagnosis present

## 2023-04-05 DIAGNOSIS — Z6838 Body mass index (BMI) 38.0-38.9, adult: Secondary | ICD-10-CM

## 2023-04-05 DIAGNOSIS — Z87442 Personal history of urinary calculi: Secondary | ICD-10-CM

## 2023-04-05 DIAGNOSIS — E861 Hypovolemia: Secondary | ICD-10-CM | POA: Diagnosis present

## 2023-04-05 DIAGNOSIS — Z888 Allergy status to other drugs, medicaments and biological substances status: Secondary | ICD-10-CM

## 2023-04-05 DIAGNOSIS — E8770 Fluid overload, unspecified: Secondary | ICD-10-CM | POA: Diagnosis present

## 2023-04-05 DIAGNOSIS — Z83438 Family history of other disorder of lipoprotein metabolism and other lipidemia: Secondary | ICD-10-CM

## 2023-04-05 DIAGNOSIS — R5381 Other malaise: Secondary | ICD-10-CM | POA: Diagnosis present

## 2023-04-05 DIAGNOSIS — E871 Hypo-osmolality and hyponatremia: Secondary | ICD-10-CM | POA: Diagnosis present

## 2023-04-05 DIAGNOSIS — H1789 Other corneal scars and opacities: Secondary | ICD-10-CM | POA: Diagnosis present

## 2023-04-05 DIAGNOSIS — E876 Hypokalemia: Secondary | ICD-10-CM | POA: Diagnosis present

## 2023-04-05 NOTE — ED Triage Notes (Signed)
 Confusion that started tonight, hx of hepatic encephalopathy. Pt is lethargic, weak. Son states he gave pt lactulose PTA.

## 2023-04-06 ENCOUNTER — Inpatient Hospital Stay (HOSPITAL_COMMUNITY): Payer: Medicare (Managed Care)

## 2023-04-06 ENCOUNTER — Encounter (HOSPITAL_COMMUNITY): Payer: Self-pay | Admitting: Internal Medicine

## 2023-04-06 DIAGNOSIS — L03115 Cellulitis of right lower limb: Principal | ICD-10-CM | POA: Insufficient documentation

## 2023-04-06 DIAGNOSIS — K219 Gastro-esophageal reflux disease without esophagitis: Secondary | ICD-10-CM | POA: Diagnosis present

## 2023-04-06 DIAGNOSIS — K7460 Unspecified cirrhosis of liver: Secondary | ICD-10-CM

## 2023-04-06 DIAGNOSIS — D689 Coagulation defect, unspecified: Secondary | ICD-10-CM | POA: Diagnosis present

## 2023-04-06 DIAGNOSIS — A4150 Gram-negative sepsis, unspecified: Secondary | ICD-10-CM | POA: Diagnosis not present

## 2023-04-06 DIAGNOSIS — E861 Hypovolemia: Secondary | ICD-10-CM | POA: Diagnosis present

## 2023-04-06 DIAGNOSIS — E66812 Obesity, class 2: Secondary | ICD-10-CM | POA: Diagnosis not present

## 2023-04-06 DIAGNOSIS — K7030 Alcoholic cirrhosis of liver without ascites: Secondary | ICD-10-CM | POA: Diagnosis present

## 2023-04-06 DIAGNOSIS — A419 Sepsis, unspecified organism: Secondary | ICD-10-CM | POA: Diagnosis present

## 2023-04-06 DIAGNOSIS — E872 Acidosis, unspecified: Secondary | ICD-10-CM | POA: Diagnosis present

## 2023-04-06 DIAGNOSIS — Z6838 Body mass index (BMI) 38.0-38.9, adult: Secondary | ICD-10-CM | POA: Diagnosis not present

## 2023-04-06 DIAGNOSIS — G2581 Restless legs syndrome: Secondary | ICD-10-CM | POA: Diagnosis present

## 2023-04-06 DIAGNOSIS — E785 Hyperlipidemia, unspecified: Secondary | ICD-10-CM | POA: Diagnosis present

## 2023-04-06 DIAGNOSIS — N179 Acute kidney failure, unspecified: Secondary | ICD-10-CM

## 2023-04-06 DIAGNOSIS — D696 Thrombocytopenia, unspecified: Secondary | ICD-10-CM | POA: Diagnosis not present

## 2023-04-06 DIAGNOSIS — E876 Hypokalemia: Secondary | ICD-10-CM | POA: Diagnosis present

## 2023-04-06 DIAGNOSIS — L0390 Cellulitis, unspecified: Secondary | ICD-10-CM | POA: Insufficient documentation

## 2023-04-06 DIAGNOSIS — W5503XA Scratched by cat, initial encounter: Secondary | ICD-10-CM | POA: Diagnosis not present

## 2023-04-06 DIAGNOSIS — D6959 Other secondary thrombocytopenia: Secondary | ICD-10-CM | POA: Diagnosis present

## 2023-04-06 DIAGNOSIS — H1789 Other corneal scars and opacities: Secondary | ICD-10-CM | POA: Diagnosis not present

## 2023-04-06 DIAGNOSIS — K439 Ventral hernia without obstruction or gangrene: Secondary | ICD-10-CM | POA: Diagnosis not present

## 2023-04-06 DIAGNOSIS — E119 Type 2 diabetes mellitus without complications: Secondary | ICD-10-CM | POA: Diagnosis present

## 2023-04-06 DIAGNOSIS — D649 Anemia, unspecified: Secondary | ICD-10-CM | POA: Diagnosis present

## 2023-04-06 DIAGNOSIS — R6520 Severe sepsis without septic shock: Secondary | ICD-10-CM | POA: Diagnosis not present

## 2023-04-06 DIAGNOSIS — F32A Depression, unspecified: Secondary | ICD-10-CM | POA: Diagnosis present

## 2023-04-06 DIAGNOSIS — Z1623 Resistance to quinolones and fluoroquinolones: Secondary | ICD-10-CM | POA: Diagnosis present

## 2023-04-06 DIAGNOSIS — E039 Hypothyroidism, unspecified: Secondary | ICD-10-CM | POA: Diagnosis present

## 2023-04-06 DIAGNOSIS — E871 Hypo-osmolality and hyponatremia: Secondary | ICD-10-CM | POA: Diagnosis present

## 2023-04-06 DIAGNOSIS — K529 Noninfective gastroenteritis and colitis, unspecified: Secondary | ICD-10-CM | POA: Diagnosis not present

## 2023-04-06 DIAGNOSIS — I10 Essential (primary) hypertension: Secondary | ICD-10-CM | POA: Diagnosis present

## 2023-04-06 DIAGNOSIS — K90829 Short bowel syndrome, unspecified: Secondary | ICD-10-CM | POA: Diagnosis not present

## 2023-04-06 DIAGNOSIS — Z66 Do not resuscitate: Secondary | ICD-10-CM | POA: Diagnosis not present

## 2023-04-06 LAB — CBC WITH DIFFERENTIAL/PLATELET
Abs Immature Granulocytes: 0.04 10*3/uL (ref 0.00–0.07)
Abs Immature Granulocytes: 0.13 10*3/uL — ABNORMAL HIGH (ref 0.00–0.07)
Basophils Absolute: 0 10*3/uL (ref 0.0–0.1)
Basophils Absolute: 0 10*3/uL (ref 0.0–0.1)
Basophils Relative: 0 %
Basophils Relative: 0 %
Eosinophils Absolute: 0.1 10*3/uL (ref 0.0–0.5)
Eosinophils Absolute: 0 10*3/uL (ref 0.0–0.5)
Eosinophils Relative: 2 %
Eosinophils Relative: 0 %
HCT: 37 % (ref 36.0–46.0)
HCT: 30.3 % — ABNORMAL LOW (ref 36.0–46.0)
Hemoglobin: 11.7 g/dL — ABNORMAL LOW (ref 12.0–15.0)
Hemoglobin: 9.4 g/dL — ABNORMAL LOW (ref 12.0–15.0)
Immature Granulocytes: 1 %
Immature Granulocytes: 2 %
Lymphocytes Relative: 5 %
Lymphocytes Relative: 6 %
Lymphs Abs: 0.3 10*3/uL — ABNORMAL LOW (ref 0.7–4.0)
Lymphs Abs: 0.5 10*3/uL — ABNORMAL LOW (ref 0.7–4.0)
MCH: 29.7 pg (ref 26.0–34.0)
MCH: 29.3 pg (ref 26.0–34.0)
MCHC: 31.6 g/dL (ref 30.0–36.0)
MCHC: 31 g/dL (ref 30.0–36.0)
MCV: 93.9 fL (ref 80.0–100.0)
MCV: 94.4 fL (ref 80.0–100.0)
Monocytes Absolute: 0.1 10*3/uL (ref 0.1–1.0)
Monocytes Absolute: 0.5 10*3/uL (ref 0.1–1.0)
Monocytes Relative: 2 %
Monocytes Relative: 6 %
Neutro Abs: 5.1 10*3/uL (ref 1.7–7.7)
Neutro Abs: 7.5 10*3/uL (ref 1.7–7.7)
Neutrophils Relative %: 90 %
Neutrophils Relative %: 86 %
Platelets: 119 10*3/uL — ABNORMAL LOW (ref 150–400)
Platelets: 81 10*3/uL — ABNORMAL LOW (ref 150–400)
RBC: 3.94 MIL/uL (ref 3.87–5.11)
RBC: 3.21 MIL/uL — ABNORMAL LOW (ref 3.87–5.11)
RDW: 17 % — ABNORMAL HIGH (ref 11.5–15.5)
RDW: 17.1 % — ABNORMAL HIGH (ref 11.5–15.5)
Smear Review: NORMAL
WBC: 5.7 10*3/uL (ref 4.0–10.5)
WBC: 8.7 10*3/uL (ref 4.0–10.5)
nRBC: 0 % (ref 0.0–0.2)
nRBC: 0 % (ref 0.0–0.2)

## 2023-04-06 LAB — BASIC METABOLIC PANEL WITH GFR
Anion gap: 7 (ref 5–15)
BUN: 19 mg/dL (ref 8–23)
CO2: 16 mmol/L — ABNORMAL LOW (ref 22–32)
Calcium: 8.1 mg/dL — ABNORMAL LOW (ref 8.9–10.3)
Chloride: 110 mmol/L (ref 98–111)
Creatinine, Ser: 1.23 mg/dL — ABNORMAL HIGH (ref 0.44–1.00)
GFR, Estimated: 49 mL/min — ABNORMAL LOW (ref >60)
Glucose, Bld: 120 mg/dL — ABNORMAL HIGH (ref 70–99)
Potassium: 3.3 mmol/L — ABNORMAL LOW (ref 3.5–5.1)
Sodium: 133 mmol/L — ABNORMAL LOW (ref 135–145)

## 2023-04-06 LAB — COMPREHENSIVE METABOLIC PANEL WITH GFR
ALT: 21 U/L (ref 0–44)
AST: 40 U/L (ref 15–41)
Albumin: 3 g/dL — ABNORMAL LOW (ref 3.5–5.0)
Alkaline Phosphatase: 94 U/L (ref 38–126)
Anion gap: 9 (ref 5–15)
BUN: 18 mg/dL (ref 8–23)
CO2: 15 mmol/L — ABNORMAL LOW (ref 22–32)
Calcium: 8.5 mg/dL — ABNORMAL LOW (ref 8.9–10.3)
Chloride: 112 mmol/L — ABNORMAL HIGH (ref 98–111)
Creatinine, Ser: 1.12 mg/dL — ABNORMAL HIGH (ref 0.44–1.00)
GFR, Estimated: 55 mL/min — ABNORMAL LOW (ref >60)
Glucose, Bld: 111 mg/dL — ABNORMAL HIGH (ref 70–99)
Potassium: 3.8 mmol/L (ref 3.5–5.1)
Sodium: 136 mmol/L (ref 135–145)
Total Bilirubin: 2.5 mg/dL — ABNORMAL HIGH (ref 0.0–1.2)
Total Protein: 6.3 g/dL — ABNORMAL LOW (ref 6.5–8.1)

## 2023-04-06 LAB — BLOOD CULTURE ID PANEL (REFLEXED) - BCID2

## 2023-04-06 LAB — AMMONIA: Ammonia: 46 umol/L — ABNORMAL HIGH (ref 9–35)

## 2023-04-06 LAB — HEPATIC FUNCTION PANEL
ALT: 20 U/L (ref 0–44)
AST: 47 U/L — ABNORMAL HIGH (ref 15–41)
Albumin: 2.3 g/dL — ABNORMAL LOW (ref 3.5–5.0)
Alkaline Phosphatase: 60 U/L (ref 38–126)
Bilirubin, Direct: 0.6 mg/dL — ABNORMAL HIGH (ref 0.0–0.2)
Indirect Bilirubin: 1.5 mg/dL — ABNORMAL HIGH (ref 0.3–0.9)
Total Bilirubin: 2.1 mg/dL — ABNORMAL HIGH (ref 0.0–1.2)
Total Protein: 4.8 g/dL — ABNORMAL LOW (ref 6.5–8.1)

## 2023-04-06 LAB — PROTIME-INR
INR: 1.8 — ABNORMAL HIGH (ref 0.8–1.2)
Prothrombin Time: 21.3 s — ABNORMAL HIGH (ref 11.4–15.2)

## 2023-04-06 LAB — I-STAT CG4 LACTIC ACID, ED: Lactic Acid, Venous: 3.3 mmol/L (ref 0.5–1.9)

## 2023-04-06 MED ORDER — VITAMIN B-12 1000 MCG PO TABS
2000.0000 ug | ORAL_TABLET | ORAL | Status: DC
  Administered 2023-04-07 – 2023-04-14 (×2): 2000 ug via ORAL
  Filled 2023-04-06 (×3): qty 2

## 2023-04-06 MED ORDER — LACTATED RINGERS IV SOLN: INTRAVENOUS | Status: DC

## 2023-04-06 MED ORDER — IRON-VITAMIN C 100-250 MG PO TABS: ORAL_TABLET | Freq: Every day | ORAL | Status: DC

## 2023-04-06 MED ORDER — LACTATED RINGERS IV BOLUS (SEPSIS)
1000.0000 mL | Freq: Once | INTRAVENOUS | Status: AC
  Administered 2023-04-06: 1000 mL via INTRAVENOUS

## 2023-04-06 MED ORDER — FOLIC ACID 1 MG PO TABS
1.0000 mg | ORAL_TABLET | Freq: Every day | ORAL | Status: DC
  Administered 2023-04-06 – 2023-04-14 (×9): 1 mg via ORAL
  Filled 2023-04-06 (×9): qty 1

## 2023-04-06 MED ORDER — SODIUM CHLORIDE 0.9 % IV SOLN: INTRAVENOUS | Status: AC

## 2023-04-06 MED ORDER — ROPINIROLE HCL 0.25 MG PO TABS
0.2500 mg | ORAL_TABLET | Freq: Every day | ORAL | Status: DC
  Administered 2023-04-06 – 2023-04-13 (×8): 0.25 mg via ORAL
  Filled 2023-04-06 (×8): qty 1

## 2023-04-06 MED ORDER — PANTOPRAZOLE SODIUM 40 MG PO TBEC
40.0000 mg | DELAYED_RELEASE_TABLET | Freq: Two times a day (BID) | ORAL | Status: DC
  Administered 2023-04-06 – 2023-04-14 (×17): 40 mg via ORAL
  Filled 2023-04-06 (×18): qty 1

## 2023-04-06 MED ORDER — VANCOMYCIN HCL IN DEXTROSE 1-5 GM/200ML-% IV SOLN
1000.0000 mg | Freq: Once | INTRAVENOUS | Status: AC
  Administered 2023-04-06: 1000 mg via INTRAVENOUS
  Filled 2023-04-06: qty 200

## 2023-04-06 MED ORDER — VANCOMYCIN HCL 1250 MG/250ML IV SOLN
1250.0000 mg | INTRAVENOUS | Status: DC
  Filled 2023-04-06: qty 250

## 2023-04-06 MED ORDER — FAMOTIDINE 20 MG PO TABS
20.0000 mg | ORAL_TABLET | Freq: Every morning | ORAL | Status: DC
  Administered 2023-04-06 – 2023-04-14 (×9): 20 mg via ORAL
  Filled 2023-04-06 (×9): qty 1

## 2023-04-06 MED ORDER — ORAL CARE MOUTH RINSE: 15.0000 mL | OROMUCOSAL | Status: DC | PRN

## 2023-04-06 MED ORDER — FERROUS SULFATE 325 (65 FE) MG PO TABS
325.0000 mg | ORAL_TABLET | Freq: Every day | ORAL | Status: DC
  Administered 2023-04-07 – 2023-04-14 (×8): 325 mg via ORAL
  Filled 2023-04-06 (×8): qty 1

## 2023-04-06 MED ORDER — LACTATED RINGERS IV SOLN: INTRAVENOUS | Status: AC

## 2023-04-06 MED ORDER — HEPARIN SODIUM (PORCINE) 5000 UNIT/ML IJ SOLN
5000.0000 [IU] | Freq: Three times a day (TID) | INTRAMUSCULAR | Status: DC
  Administered 2023-04-06 – 2023-04-07 (×5): 5000 [IU] via SUBCUTANEOUS
  Filled 2023-04-06 (×5): qty 1

## 2023-04-06 MED ORDER — SODIUM CHLORIDE 0.9 % IV SOLN
500.0000 mg | INTRAVENOUS | Status: DC
  Administered 2023-04-06: 500 mg via INTRAVENOUS
  Filled 2023-04-06: qty 5

## 2023-04-06 MED ORDER — ATORVASTATIN CALCIUM 40 MG PO TABS
40.0000 mg | ORAL_TABLET | Freq: Every morning | ORAL | Status: DC
  Administered 2023-04-06 – 2023-04-14 (×9): 40 mg via ORAL
  Filled 2023-04-06 (×9): qty 1

## 2023-04-06 MED ORDER — METRONIDAZOLE 500 MG/100ML IV SOLN
500.0000 mg | Freq: Once | INTRAVENOUS | Status: AC
  Administered 2023-04-06: 500 mg via INTRAVENOUS
  Filled 2023-04-06: qty 100

## 2023-04-06 MED ORDER — MORPHINE SULFATE (PF) 2 MG/ML IV SOLN
1.0000 mg | INTRAVENOUS | Status: DC | PRN
  Administered 2023-04-06 – 2023-04-14 (×23): 1 mg via INTRAVENOUS
  Filled 2023-04-06 (×23): qty 1

## 2023-04-06 MED ORDER — ACETAMINOPHEN 325 MG PO TABS
650.0000 mg | ORAL_TABLET | Freq: Four times a day (QID) | ORAL | Status: DC | PRN
  Administered 2023-04-06: 650 mg via ORAL
  Filled 2023-04-06 (×2): qty 2

## 2023-04-06 MED ORDER — VENLAFAXINE HCL ER 75 MG PO CP24
75.0000 mg | ORAL_CAPSULE | Freq: Every day | ORAL | Status: DC
  Administered 2023-04-06 – 2023-04-14 (×9): 75 mg via ORAL
  Filled 2023-04-06 (×9): qty 1

## 2023-04-06 MED ORDER — RIFAXIMIN 550 MG PO TABS
550.0000 mg | ORAL_TABLET | Freq: Two times a day (BID) | ORAL | Status: DC
  Administered 2023-04-06 – 2023-04-14 (×17): 550 mg via ORAL
  Filled 2023-04-06 (×17): qty 1

## 2023-04-06 MED ORDER — METRONIDAZOLE 500 MG/100ML IV SOLN
500.0000 mg | Freq: Two times a day (BID) | INTRAVENOUS | Status: DC
  Administered 2023-04-06: 500 mg via INTRAVENOUS
  Filled 2023-04-06: qty 100

## 2023-04-06 MED ORDER — SODIUM CHLORIDE 0.9 % IV SOLN
2.0000 g | INTRAVENOUS | Status: DC
  Administered 2023-04-06: 2 g via INTRAVENOUS
  Filled 2023-04-06: qty 20

## 2023-04-06 MED ORDER — SODIUM CHLORIDE 0.9 % IV SOLN
2.0000 g | Freq: Two times a day (BID) | INTRAVENOUS | Status: DC
  Administered 2023-04-06: 2 g via INTRAVENOUS
  Filled 2023-04-06 (×2): qty 12.5

## 2023-04-06 MED ORDER — LEVOTHYROXINE SODIUM 50 MCG PO TABS
175.0000 ug | ORAL_TABLET | Freq: Every day | ORAL | Status: DC
  Administered 2023-04-06 – 2023-04-14 (×9): 175 ug via ORAL
  Filled 2023-04-06 (×9): qty 1

## 2023-04-06 MED ORDER — LACTULOSE 10 GM/15ML PO SOLN
20.0000 g | Freq: Three times a day (TID) | ORAL | Status: DC
  Administered 2023-04-06 – 2023-04-13 (×11): 20 g via ORAL
  Filled 2023-04-06 (×18): qty 30

## 2023-04-06 MED ORDER — MIDODRINE HCL 5 MG PO TABS
10.0000 mg | ORAL_TABLET | Freq: Three times a day (TID) | ORAL | Status: DC
  Administered 2023-04-07 – 2023-04-14 (×20): 10 mg via ORAL
  Filled 2023-04-06 (×21): qty 2

## 2023-04-06 MED ORDER — SODIUM CHLORIDE 0.9 % IV SOLN
2.0000 g | Freq: Once | INTRAVENOUS | Status: AC
  Administered 2023-04-06: 2 g via INTRAVENOUS
  Filled 2023-04-06: qty 12.5

## 2023-04-06 MED ORDER — FENTANYL CITRATE PF 50 MCG/ML IJ SOSY
12.5000 ug | PREFILLED_SYRINGE | Freq: Once | INTRAMUSCULAR | Status: AC
  Administered 2023-04-06: 12.5 ug via INTRAVENOUS
  Filled 2023-04-06: qty 1

## 2023-04-06 NOTE — ED Notes (Signed)
 Warm blankets provided to patient.

## 2023-04-06 NOTE — Progress Notes (Signed)
 MD made aware that patient's BP 99/47 MAP (64) upon arrival to floor. Since patient's IV continuous fluids were about to expire at 1800, MD placed new orders to continue IV fluids.

## 2023-04-06 NOTE — ED Notes (Signed)
 RN attempted to call [facility] to inform them patient is about to be transported. Green man given.

## 2023-04-06 NOTE — ED Provider Notes (Signed)
 [place] EMERGENCY DEPARTMENT AT [facility] Provider Note   CSN: [id number] Arrival date & time: 04/05/23  2241     History  Chief Complaint  Patient presents with   Weakness   Altered Mental Status    [name] is a 68 y.o. female.  The history is provided by the patient and a relative.  Weakness Altered Mental Status Associated symptoms: weakness   She has history of hypertension, cirrhosis, hepatic encephalopathy, pulmonary embolism and comes in because of chills and decreased mental status today.  She is complaining of pain in her right thigh and her son noted that she started breaking out in a rash there and was concerned that she may have scratched herself.  She did not have a fever at home.  There have been no sweats.  There has been no cough or nausea or vomiting.  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   Home Medications Prior to Admission medications   Medication Sig Start Date End Date Taking? Authorizing Provider  albuterol (VENTOLIN HFA) 108 (90 Base) MCG/ACT inhaler Inhale 2 puffs into the lungs every 6 (six) hours as needed for shortness of breath or wheezing. 02/13/23   [provider]  atorvastatin (LIPITOR) 40 MG tablet Take 1 tablet (40 mg total) by mouth in the morning. 02/22/23   [name], MD  copper tablet Take 2 mg by mouth in the morning and at bedtime.    [provider]  cyanocobalamin (VITAMIN B12) 1000 MCG tablet Take 2,000 mcg by mouth every Friday.    [provider]  dicyclomine (BENTYL) 10 MG capsule Take 1 capsule (10 mg total) by mouth 3 (three) times daily as needed for spasms (abdominal cramping). 09/09/22   Lewie Chamber, MD  famotidine (PEPCID) 20 MG tablet Take 20 mg by mouth in the morning.    [provider]  folic acid (FOLVITE) 1 MG tablet Take 1 tablet (1 mg total) by mouth daily. 06/16/22   Calvert Cantor, MD  furosemide (LASIX) 40 MG tablet  Take 1 tablet (40 mg total) by mouth daily. Take additional 40 mg if there is a weight gain of 3 lbs in1 day or 5 lbs in 1 week. 12/17/22   Marinda Elk, MD  HYDROcodone-acetaminophen (NORCO) 10-325 MG tablet Take 1 tablet by mouth every 8 (eight) hours as needed for moderate pain (pain score 4-6). 12/19/22   [provider]  IRON-VITAMIN C PO Take 1 tablet by mouth daily with breakfast.    [provider]  lactulose (CHRONULAC) 10 GM/15ML solution Take 30 mLs (20 g total) by mouth 3 (three) times daily. Take to ensure that there is 2 loose bowel movement EVERY DAY Patient taking differently: Take 30 mLs by mouth 3 (three) times daily as needed (to ensure there are 2 loose bowel movements EVERY DAY). 10/31/22   Rolly Salter, MD  levothyroxine (SYNTHROID) 150 MCG tablet Take 150 mcg by mouth daily before breakfast. 10/26/21   [provider]  levothyroxine (SYNTHROID) 175 MCG tablet Take 1 tablet (175 mcg total) by mouth daily at 6 (six) AM. Recheck TSH with PCP in 4 weeks 04/03/23   [name], [name], DO  lidocaine (LIDODERM) 5 % Place 1 patch onto the skin daily. Remove & Discard patch within 12 hours or as directed by MD Patient taking differently: Place 1 patch onto the skin daily as needed (for pain- Remove & Discard patch within 12 hours or  as directed by MD). 10/31/22   Rolly Salter, MD  magnesium gluconate (MAGONATE) 500 (27 Mg) MG TABS tablet Take 500 mg by mouth in the morning and at bedtime.    [provider]  megestrol (MEGACE) 40 MG tablet Take 1 tablet (40 mg total) by mouth 2 (two) times daily. 01/13/23   Standley Brooking, MD  methocarbamol (ROBAXIN) 500 MG tablet Take 500 mg by mouth in the morning. 03/04/22   [provider]  midodrine (PROAMATINE) 10 MG tablet Take 1 tablet (10 mg total) by mouth 3 (three) times daily with meals. 09/09/22   Lewie Chamber, MD  Multiple Vitamins-Minerals (CENTRUM SILVER ULTRA WOMENS PO) Take 1 tablet  by mouth in the morning.    [provider]  naloxone [facility]) nasal spray 4 mg/0.1 mL Place 1 spray into the nose as needed (accidental overdose). 03/25/22   [provider]  nystatin cream (MYCOSTATIN) Apply topically 2 (two) times daily. 01/13/23   Standley Brooking, MD  pantoprazole (PROTONIX) 40 MG tablet Take 1 tablet (40 mg total) by mouth 2 (two) times daily before a meal. 09/09/22   Lewie Chamber, MD  potassium chloride (KLOR-CON) 10 MEQ tablet Take 20 mEq by mouth 2 (two) times daily. 02/22/23   [provider]  promethazine (PHENERGAN) 12.5 MG tablet Take 12.5 mg by mouth every 6 (six) hours as needed for nausea or vomiting.    [provider]  rifaximin (XIFAXAN) 550 MG TABS tablet Take 550 mg by mouth 2 (two) times daily.    [provider]  rOPINIRole (REQUIP) 0.25 MG tablet Take 1 tablet (0.25 mg total) by mouth at bedtime. Patient taking differently: Take 0.25 mg by mouth daily. 09/09/22   Lewie Chamber, MD  spironolactone (ALDACTONE) 50 MG tablet Take 1 tablet (50 mg total) by mouth daily. 03/20/23   [name], MD  tamsulosin (FLOMAX) 0.4 MG CAPS capsule Take 1 capsule (0.4 mg total) by mouth daily. 09/10/22   Lewie Chamber, MD  triamcinolone cream (KENALOG) 0.5 % Apply to affected area of right breast twice daily as directed. Patient taking differently: Apply 1 Application topically 2 (two) times daily as needed (for irritation- right breast). 05/10/22   Osvaldo Shipper, MD  venlafaxine XR (EFFEXOR-XR) 75 MG 24 hr capsule Take 75 mg by mouth daily with breakfast. 08/17/21   [provider]  Vitamin D, Ergocalciferol, (DRISDOL) 50000 UNITS CAPS Take 50,000 Units by mouth every Wednesday.    [provider]      Allergies    Ace inhibitors, Ivp dye [iodinated contrast media], Desitin [zinc oxide], Gadolinium derivatives, Hydroxyzine, Chlorhexidine, Doxycycline, Penicillins, and Zofran [ondansetron]    Review of Systems    Review of Systems  Neurological:  Positive for weakness.  All other systems reviewed and are negative.   Physical Exam Updated Vital Signs BP (!) 110/48   Pulse 75   Temp 98.5 F (36.9 C) (Oral)   Resp 18   Ht 5\' 2"  (1.575 m)   Wt 94.6 kg   SpO2 100%   BMI 38.15 kg/m  Physical Exam Vitals and nursing note reviewed.   68 year old female, resting comfortably and in no acute distress. Vital signs are significant for elevated temperature which did come down with simple observation. Oxygen saturation is 100%, which is normal. Head is normocephalic and atraumatic. PERRLA, EOMI. Oropharynx is clear. Neck is nontender and supple without adenopathy. Back is nontender and there is no CVA tenderness. Lungs are  clear without rales, wheezes, or rhonchi. Chest is nontender. Heart has regular rate and rhythm with 2-3/6 holosystolic murmur. Abdomen is soft, flat, nontender. Extremities have 1-2+ edema, full range of motion is present. Skin: Erythema and warmth noted of the skin at the medial right thigh consistent with cellulitis.  Erythema does not extend to the perineum. Neurologic: Sleepy but easily arousable, oriented, cranial nerves are intact, moves all extremities equally.  ED Results / Procedures / Treatments   Labs (all labs ordered are listed, but only abnormal results are displayed) Labs Reviewed  COMPREHENSIVE METABOLIC PANEL - Abnormal; Notable for the following components:      Result Value   Chloride 112 (*)    CO2 15 (*)    Glucose, Bld 111 (*)    Creatinine, Ser 1.12 (*)    Calcium 8.5 (*)    Total Protein 6.3 (*)    Albumin 3.0 (*)    Total Bilirubin 2.5 (*)    GFR, Estimated 55 (*)    All other components within normal limits  CBC WITH DIFFERENTIAL/PLATELET - Abnormal; Notable for the following components:   Hemoglobin 11.7 (*)    RDW 17.0 (*)    Platelets 119 (*)    Lymphs Abs 0.3 (*)    All other components within normal limits  PROTIME-INR - Abnormal;  Notable for the following components:   Prothrombin Time 21.3 (*)    INR 1.8 (*)    All other components within normal limits  AMMONIA - Abnormal; Notable for the following components:   Ammonia 46 (*)    All other components within normal limits  I-STAT CG4 LACTIC ACID, ED - Abnormal; Notable for the following components:   Lactic Acid, Venous 3.3 (*)    All other components within normal limits  CULTURE, BLOOD (ROUTINE X 2)  CULTURE, BLOOD (ROUTINE X 2)  URINALYSIS, W/ REFLEX TO CULTURE (INFECTION SUSPECTED)  I-STAT CG4 LACTIC ACID, ED    EKG None  Radiology DG Chest 2 View Result Date: 04/06/2023 CLINICAL DATA:  Suspected sepsis.  Weakness and confusion. EXAM: CHEST - 2 VIEW COMPARISON:  03/07/2023 FINDINGS: Shallow inspiration. Heart size and pulmonary vascularity are normal for technique. Lungs are clear. No pleural effusion or pneumothorax. Mediastinal contours appear intact. Calcification of the aorta. Surgical clips in the right upper quadrant. IMPRESSION: Shallow inspiration.  No evidence of active pulmonary disease. Electronically Signed   By: Burman Nieves M.D.   On: 04/06/2023 00:04    Procedures Procedures  Cardiac monitor shows normal sinus rhythm, per my interpretation.  Medications Ordered in ED Medications  lactated ringers infusion (has no administration in time range)  lactated ringers bolus 1,000 mL (1,000 mLs Intravenous New Bag/Given 04/06/23 0135)    And  lactated ringers bolus 1,000 mL (has no administration in time range)    And  lactated ringers bolus 1,000 mL (has no administration in time range)  metroNIDAZOLE (FLAGYL) IVPB 500 mg (500 mg Intravenous New Bag/Given 04/06/23 0135)  vancomycin (VANCOCIN) IVPB 1000 mg/200 mL premix (has no administration in time range)  ceFEPIme (MAXIPIME) 2 g in sodium chloride 0.9 % 100 mL IVPB (0 g Intravenous Stopped 04/06/23 0146)    ED Course/ Medical Decision Making/ A&P                                  Medical Decision Making Amount and/or Complexity of Data Reviewed Labs: ordered. Radiology: ordered.  Risk Prescription drug management. Decision regarding hospitalization.   Chills with physical findings of cellulitis of her right leg.  Labs for sepsis had been drawn at triage.  I have reviewed the results and my interpretation is elevated lactic acid level consistent with sepsis, metabolic acidosis which is chronic, slight increase in creatinine compared with 04/03/2023, elevated bilirubin slightly worse than prior, anemia which is improved compared with prior, chronic thrombocytopenia, elevated INR which is stable and felt to be due to her underlying cirrhosis.  Ammonia level is pending.  Chest x-ray showed no evidence of pneumonia.  Have independently viewed the images, and agree with the radiologist's interpretation.  I have ordered IV antibiotics and IV early goal-directed fluid therapy.  She will need to be admitted.  I have reviewed her past records, and she was admitted 03/29/2023-04/03/2023 for hepatic encephalopathy.  I have discussed case with Dr. Toniann Fail of [facility] hospitalist who agrees to admit the patient.  CRITICAL CARE Performed by: Dione Booze Total critical care time: 60 minutes Critical care time was exclusive of separately billable procedures and treating other patients. Critical care was necessary to treat or prevent imminent or life-threatening deterioration. Critical care was time spent personally by me on the following activities: development of treatment plan with patient and/or surrogate as well as nursing, discussions with consultants, evaluation of patient's response to treatment, examination of patient, obtaining history from patient or surrogate, ordering and performing treatments and interventions, ordering and review of laboratory studies, ordering and review of radiographic studies, pulse oximetry and re-evaluation of patient's condition.  Final Clinical  Impression(s) / ED Diagnoses Final diagnoses:  Cellulitis of right thigh  Elevated lactic acid level  Hepatic cirrhosis, unspecified hepatic cirrhosis type, unspecified whether ascites present (HCC)  Normochromic normocytic anemia  Elevated random blood glucose level    Rx / DC Orders ED Discharge Orders     None         Dione Booze, MD 04/06/23 [phone]

## 2023-04-06 NOTE — Progress Notes (Signed)
 Elink monitoring for the code sepsis protocol.

## 2023-04-06 NOTE — ED Notes (Signed)
 Patient transported to CT

## 2023-04-06 NOTE — H&P (Signed)
 History and Physical    [name] WUJ:[id number] DOB: 08-Mar-1955 DOA: 04/05/2023  Patient coming from: Home.  Chief Complaint: Right thigh redness and confusion.  HPI: [name] is a 68 y.o. female with history of liver cirrhosis secondary to [facility], hypothyroidism, hyperlipidemia, GERD, chronic anemia and thrombocytopenia recently admitted to the hospital for hepatic encephalopathy discharged on 04/03/2023 was noticed by patient's son that patient was getting confused and was complaining of pain in the right thigh area.  As per the patient's son patient had a small scratch on the right thigh is not sure if patient herself scratched on the right thigh or the cat scratch at around noontime yesterday.  By evening patient's right thigh became more erythematous and painful and patient also became confused was brought to the ER.  Per patient's son patient has been taking her medications as advised.  She was doing fine until this happened.  Denies any abdominal pain nausea vomiting or diarrhea or chest pain productive cough.  ED Course: In the ER patient was tachycardic with a temperature of 103 F.  On exam patient has a right thigh erythema.  Cultures were obtained started on empiric antibiotics.  Chest x-ray is unremarkable.  Labs show mildly elevated creatinine.  Patient was given sepsis protocol fluid bolus and started on antibiotics.  Lactic acid was elevated.  Review of Systems: As per HPI, rest all negative.   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   Obesity    Pulmonary emboli (HCC) 01/10/2009   Pulmonary embolism (HCC)    Short gut syndrome    Thyroid disease     Past Surgical History:  Procedure Laterality Date   ABDOMINAL WALL MESH  REMOVAL     2016   CESAREAN SECTION WITH BILATERAL TUBAL LIGATION  04/30/1985   CHOLECYSTECTOMY  05/2007   Exploratory laparotomy, lysis of adhesions, takedown of ileostomy with small bowel resection, open cholecystectomy and ileocolostomy   COLON SURGERY  01/2007   exploratory laparotomy with right colecotmy and end ileostomy   COLONOSCOPY  04/02/2019   Dr Clifton James Elie Confer, MD [facility]   ESOPHAGOGASTRODUODENOSCOPY (EGD) WITH PROPOFOL N/A 10/19/2022   Procedure: ESOPHAGOGASTRODUODENOSCOPY (EGD) WITH PROPOFOL;  Surgeon: Kerin Salen, MD;  Location: [facility];  Service: Gastroenterology;  Laterality: N/A;   ESOPHAGOSCOPY  04/02/2019   EYE SURGERY Left 10/06/2020   Cataract extraciton w/intraocular lens implant- Dr Valere Dross   HERNIA REPAIR  10/2007   ILEOSTOMY  2009   states bowel perfotation wih colonsocopy   ILEOSTOMY CLOSURE  2009   INCISIONAL HERNIA REPAIR  10/11/2018   IR KYPHO THORACIC WITH BONE BIOPSY  08/31/2022   IR KYPHO THORACIC WITH BONE BIOPSY  10/27/2022   IR RADIOLOGIST EVAL & MGMT  09/27/2022   KIDNEY STONE SURGERY     LIVER RESECTION     2009   PANNICULECTOMY     repair of abdominal wall hernia   WRIST SURGERY     tendonitis  reports that she quit smoking about 38 years ago. Her smoking use included cigarettes. She has been exposed to tobacco smoke. She has never used smokeless tobacco. She reports that she does not drink alcohol and does not use drugs.  Allergies  Allergen Reactions   Ace Inhibitors Anaphylaxis and Swelling   Ivp Dye [Iodinated Contrast Media] Anaphylaxis   Desitin [Zinc Oxide] Rash and Other (See Comments)    Worsens rash   Gadolinium Derivatives Other (See Comments)    Reaction??   Hydroxyzine Other (See Comments)     Affects the mind   Chlorhexidine Rash and Other (See Comments)    WORSENS RASHES   Doxycycline Rash   Penicillins Rash   Zofran [Ondansetron] Rash    Family History  Problem Relation Age of Onset   Uterine cancer Mother    Bone cancer Father    Heart disease Father    Hyperlipidemia Father    Dementia Father    Hypertension Father    Skin cancer Sister    Hypertension Sister    Diabetes Sister    Hyperlipidemia Brother    Hypertension Brother    Heart disease Brother     Prior to Admission medications   Medication Sig Start Date End Date Taking? Authorizing Provider  albuterol (VENTOLIN HFA) 108 (90 Base) MCG/ACT inhaler Inhale 2 puffs into the lungs every 6 (six) hours as needed for shortness of breath or wheezing. 02/13/23   [provider]  atorvastatin (LIPITOR) 40 MG tablet Take 1 tablet (40 mg total) by mouth in the morning. 02/22/23   [name], MD  copper tablet Take 2 mg by mouth in the morning and at bedtime.    [provider]  cyanocobalamin (VITAMIN B12) 1000 MCG tablet Take 2,000 mcg by mouth every Friday.    [provider]  dicyclomine (BENTYL) 10 MG capsule Take 1 capsule (10 mg total) by mouth 3 (three) times daily as needed for spasms (abdominal cramping). 09/09/22   Lewie Chamber, MD  famotidine (PEPCID) 20 MG tablet Take 20 mg by mouth in the morning.    [provider]  folic acid (FOLVITE) 1 MG tablet Take 1 tablet (1 mg total) by mouth daily. 06/16/22   Calvert Cantor, MD  furosemide (LASIX) 40 MG tablet Take 1 tablet (40 mg total) by mouth daily. Take additional 40 mg if there is a weight gain of 3 lbs in1 day or 5 lbs in 1 week. 12/17/22   Marinda Elk, MD  HYDROcodone-acetaminophen (NORCO) 10-325 MG tablet Take 1 tablet by mouth every 8 (eight) hours as needed for moderate pain (pain score 4-6). 12/19/22   [provider]  IRON-VITAMIN C PO Take 1 tablet by mouth daily with breakfast.    [provider]  lactulose (CHRONULAC) 10 GM/15ML solution Take 30 mLs (20 g total) by mouth 3 (three) times daily. Take to ensure that there is 2 loose bowel movement EVERY DAY Patient taking differently: Take 30 mLs by mouth 3 (three) times daily as needed (to ensure there are 2 loose bowel movements EVERY DAY). 10/31/22   Rolly Salter, MD  levothyroxine (SYNTHROID) 150 MCG tablet Take 150 mcg by mouth daily before breakfast. 10/26/21   [provider]  levothyroxine (SYNTHROID) 175 MCG tablet Take 1 tablet (175 mcg total) by mouth daily at 6 (six) AM. Recheck TSH with PCP in 4 weeks 04/03/23   [name], [name], DO  lidocaine (LIDODERM) 5 % Place 1 patch onto  the skin daily. Remove & Discard patch within 12 hours or as directed by MD Patient taking differently: Place 1 patch onto the skin daily as needed (for pain- Remove & Discard patch within 12 hours or as directed by MD). 10/31/22   Rolly Salter, MD  magnesium gluconate (MAGONATE) 500 (27 Mg) MG TABS tablet Take 500 mg by mouth in the morning and at bedtime.    [provider]  megestrol (MEGACE) 40 MG tablet Take 1 tablet (40 mg total) by mouth 2 (two) times daily. 01/13/23   Standley Brooking, MD  methocarbamol (ROBAXIN) 500 MG tablet Take 500 mg by mouth in the morning. 03/04/22   [provider]  midodrine (PROAMATINE) 10 MG tablet Take 1 tablet (10 mg total) by mouth 3 (three) times daily with meals. 09/09/22   Lewie Chamber, MD  Multiple Vitamins-Minerals (CENTRUM SILVER ULTRA WOMENS PO) Take 1 tablet by mouth in the morning.    [provider]  naloxone [facility]) nasal spray 4 mg/0.1 mL Place 1 spray into the nose as needed (accidental overdose). 03/25/22   [provider]  nystatin cream (MYCOSTATIN) Apply topically 2 (two) times daily. 01/13/23   Standley Brooking, MD  pantoprazole (PROTONIX) 40 MG tablet Take 1 tablet (40 mg total) by mouth 2 (two) times daily before a meal. 09/09/22   Lewie Chamber, MD  potassium chloride (KLOR-CON) 10 MEQ tablet Take 20 mEq by mouth 2 (two) times daily. 02/22/23   [provider]  promethazine (PHENERGAN) 12.5 MG tablet Take 12.5 mg by mouth every 6 (six) hours as needed for nausea or vomiting.    [provider]  rifaximin (XIFAXAN) 550 MG TABS tablet Take 550 mg by mouth 2 (two) times daily.    [provider]  rOPINIRole (REQUIP) 0.25 MG tablet Take 1 tablet (0.25 mg total) by mouth at bedtime. Patient taking differently: Take 0.25 mg by mouth daily. 09/09/22   Lewie Chamber, MD  spironolactone (ALDACTONE) 50 MG tablet Take 1 tablet (50 mg total) by mouth daily. 03/20/23   [name], MD  tamsulosin (FLOMAX) 0.4 MG CAPS capsule Take 1 capsule (0.4 mg total) by mouth daily. 09/10/22   Lewie Chamber, MD  triamcinolone cream (KENALOG) 0.5 % Apply to affected area of right breast twice daily as directed. Patient taking differently: Apply 1 Application topically 2 (two) times daily as needed (for irritation- right breast). 05/10/22   Osvaldo Shipper, MD  venlafaxine XR (EFFEXOR-XR) 75 MG 24 hr capsule Take 75 mg by mouth daily with breakfast. 08/17/21   [provider]  Vitamin D, Ergocalciferol, (DRISDOL) 50000 UNITS CAPS Take 50,000 Units by mouth every Wednesday.    [provider]    Physical Exam: Constitutional: Moderately built and nourished. Vitals:   04/05/23 2303 04/06/23 0022 04/06/23 0400 04/06/23 0404  BP: (!) 110/48 (!) 110/48 (!) 112/59   Pulse: (!) 105 75 97   Resp: (!) 28 18 (!) 29   Temp: (!) 103.1 F (39.5 C) 98.5 F (36.9 C)  98.5 F (36.9 C)  TempSrc: Oral Oral  Oral  SpO2: 100% 100% 100%   Weight:      Height:       Eyes: Anicteric no pallor. ENMT: No discharge from the ears eyes nose or mouth. Neck: No mass felt.  No neck rigidity. Respiratory: No rhonchi or crepitations. Cardiovascular: S1-S2 heard. Abdomen: Soft nontender bowel sound present.  Normal hernia looks  nonobstructive. Musculoskeletal: Right thigh looks erythematous.  Has no limitation of movements. Skin: Erythema of the right thigh. Neurologic: Alert awake oriented time place and person.  Moving all extremities. Psychiatric: Oriented to time place and person.   Labs on Admission: I have personally reviewed following labs and imaging studies  CBC: Recent Labs  Lab 03/30/23 0644 03/31/23 0947 04/02/23 0640 04/03/23 0506 04/05/23 2324  WBC 5.2 5.1 5.7 5.0 5.7  NEUTROABS  --  2.9 3.0 2.7 5.1  HGB 9.2* 9.1* 9.2* 9.0* 11.7*  HCT 29.1* 30.1* 30.7* 30.4* 37.0  MCV 94.5 96.8 97.8 98.4 93.9  PLT 135* 117* 129* 105* 119*   Basic Metabolic Panel: Recent Labs  Lab 03/30/23 0644 03/31/23 0947 04/02/23 0640 04/03/23 0506 04/05/23 2324  NA 137 139 138 138 136  K 3.5 3.5 3.6 3.7 3.8  CL 112* 118* 117* 116* 112*  CO2 20* 17* 18* 18* 15*  GLUCOSE 114* 127* 101* 111* 111*  BUN 22 17 15 14 18   CREATININE 1.07* 0.89 0.74 0.85 1.12*  CALCIUM 8.4* 8.3* 8.0* 7.8* 8.5*  MG  --   --  1.6* 1.8  --   PHOS  --   --  3.9 3.8  --    GFR: Estimated Creatinine Clearance: 53.7 mL/min (A) (by C-G formula based on SCr of 1.12 mg/dL (H)). Liver Function Tests: Recent Labs  Lab 03/31/23 0947 04/02/23 0640 04/03/23 0506 04/05/23 2324  AST 27 24 24  40  ALT 19 18 17 21   ALKPHOS 68 73 71 94  BILITOT 2.0* 1.8* 1.7* 2.5*  PROT 4.5* 4.7* 4.5* 6.3*  ALBUMIN 2.2* 2.3* 2.1* 3.0*   No results for input(s): "LIPASE", "AMYLASE" in the last 168 hours. Recent Labs  Lab 04/06/23 0116  AMMONIA 46*   Coagulation Profile: Recent Labs  Lab 03/30/23 0644 04/06/23 0003  INR 1.8* 1.8*   Cardiac Enzymes: No results for input(s): "CKTOTAL", "CKMB", "CKMBINDEX", "TROPONINI" in the last 168 hours. BNP (last 3 results) No results for input(s): "PROBNP" in the last 8760 hours. HbA1C: No results for input(s): "HGBA1C" in the last 72 hours. CBG: No results for input(s): "GLUCAP" in the last 168  hours. Lipid Profile: No results for input(s): "CHOL", "HDL", "LDLCALC", "TRIG", "CHOLHDL", "LDLDIRECT" in the last 72 hours. Thyroid Function Tests: No results for input(s): "TSH", "T4TOTAL", "FREET4", "T3FREE", "THYROIDAB" in the last 72 hours. Anemia Panel: No results for input(s): "VITAMINB12", "FOLATE", "FERRITIN", "TIBC", "IRON", "RETICCTPCT" in the last 72 hours. Urine analysis:    Component Value Date/Time   COLORURINE YELLOW 03/29/2023 1651   APPEARANCEUR HAZY (A) 03/29/2023 1651   LABSPEC 1.025 03/29/2023 1651   PHURINE 5.0 03/29/2023 1651   GLUCOSEU NEGATIVE 03/29/2023 1651   HGBUR NEGATIVE 03/29/2023 1651   BILIRUBINUR NEGATIVE 03/29/2023 1651   KETONESUR NEGATIVE 03/29/2023 1651   PROTEINUR NEGATIVE 03/29/2023 1651   UROBILINOGEN 0.2 08/21/2013 0017   NITRITE NEGATIVE 03/29/2023 1651   LEUKOCYTESUR NEGATIVE 03/29/2023 1651   Sepsis Labs: @LABRCNTIP (procalcitonin:4,lacticidven:4) )No results found for this or any previous visit (from the past 240 hours).   Radiological Exams on Admission: DG Chest 2 View Result Date: 04/06/2023 CLINICAL DATA:  Suspected sepsis.  Weakness and confusion. EXAM: CHEST - 2 VIEW COMPARISON:  03/07/2023 FINDINGS: Shallow inspiration. Heart size and pulmonary vascularity are normal for technique. Lungs are clear. No pleural effusion or pneumothorax. Mediastinal contours appear intact. Calcification of the aorta. Surgical clips in the right upper quadrant. IMPRESSION: Shallow inspiration.  No evidence of active pulmonary disease. Electronically Signed   By: Chrissie Noa  Andria Meuse M.D.   On: 04/06/2023 00:04    EKG: Independently reviewed.  EKG looks artifact.  Will recheck.  Assessment/Plan Active Problems:   Cirrhosis due to NASH   Thrombocytopenia (HCC)   Hypothyroidism   Abdominal wall hernia   DNR (do not resuscitate)/DNI(Do Not intubate)   Corneal perforation of left eye   ARF (acute renal failure) (HCC)   RLS (restless legs syndrome)    Sepsis (HCC)   Cellulitis    Sepsis secondary to cellulitis of the right thigh area for which patient has been placed on vancomycin ceftriaxone and Flagyl given the history of possible cat scratch (patient is allergic to penicillin).  Follow cultures patient was given fluid bolus per sepsis protocol.  Cautious about further fluids given the cirrhosis.  CT of the right thigh has been ordered to check for any deep abscess. Acute renal failure with creatinine mildly increased from recent past.  Will hold Lasix and spironolactone for now.  Did receive fluid bolus in the ER. Liver cirrhosis secondary to NASH recently admitted for hepatic encephalopathy.  Continue lactulose and Xifaxan.  Appears oriented at the time of my exam. Hypothyroidism on Synthroid. Hyperlipidemia on statins. Depression on Effexor. Chronic anemia on iron and B12 and folate supplements. Thrombocytopenia likely from cirrhosis follow CBC. Restless leg syndrome on Requip. GERD on PPI and Pepcid. History of copper deficiency being followed by gastroenterologist.  Since patient has sepsis will need close monitoring further management and more than 2 midnight stay.   DVT prophylaxis: Heparin. Code Status: DNR. Family Communication: Patient's son. Disposition Plan: Progressive care. Consults called: None. Admission status: Inpatient.

## 2023-04-06 NOTE — Progress Notes (Addendum)
 PROGRESS NOTE  [name]  BJY:[id number] DOB: December 07, 1955 DOA: 04/05/2023 PCP: [name], MD  Consultants  Brief Narrative: 68 y.o. female with history of liver cirrhosis secondary to [facility], hypothyroidism, hyperlipidemia, GERD, chronic anemia and thrombocytopenia recently admitted to the hospital for hepatic encephalopathy discharged on 04/03/2023 was noticed by patient's son that patient was getting confused and was complaining of pain in the right thigh area.  As per the patient's son patient had a small scratch on the right thigh is not sure if patient herself scratched on the right thigh or the cat scratch at around noontime yesterday.  By evening patient's right thigh became more erythematous and painful and patient also became confused was brought to the ER.  Found to be tachycardic, febrile with right erythema and edema.  Elevated lactic acid.  Patient therefore admitted for right thigh cellulitis.    Assessment & Plan: Sepsis secondary to cellulitis of the right thigh  -Sepsis is now resolved, although blood pressure is low.  Has history of low blood pressures. -On day/ceftriaxone/Flagyl due to history of possible cat scratch and penicillin allergy.   -Cultures pending.  Fluid bolus per sepsis protocol in ER.  Follow cultures patient was given fluid bolus per sepsis protocol.   -History of cirrhosis and acute renal failure.  Will allow to continue oral rehydration.  Cautious about further fluids given the cirrhosis.   - CT of the right thigh showed diffuse fat stranding and concerns for cellulitis, similar to slightly increased compared to prior exam.  No loculated fluid collection.  No gas. -Continue antibiotics.  Continue pain medications.  Acute renal failure  - with creatinine mildly increased from recent past.  -Status post fluid bolus in ER. -Holding outpatient diuretics.  Liver cirrhosis  - secondary to NASH recently admitted for hepatic encephalopathy.   - Continue  lactulose and Xifaxan.   -Oriented at time my exam.  Recent admission for hepatic encephalopathy noted. -PT/INR elevated.  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  Reports she has strong history of easy fluid overload.  - on lasix/aldactone daily.  - also midodrine.  Home BP runs low 100s - 110s systolic - Changing to just 500 cc IVF due to this.  Restarting home midodrine - will restart home diuretics pending tomorrow's AM labs   Metabolic acidosis: -Non-anion gap. - Present for at least the past year -Unclear what her baseline GFR is although appears to be stage III kidney disease.   - Recommend nephrology follow-up outpatient and consideration of starting oral bicarb  Hypothyroidism  - on Synthroid.  TSH minimally elevated  Hyponatremia: -Presumed hypovolemic. -Will do another 500 cc bolus and recheck in AM.  Chronic vaginal bleeding: - on megace person/patient for the same - unclear workup for this - will watch for bleeding resumption - needs outpt gyn follow-up  Hypokalemia: -Replace potassium  Hyperlipidemia on statins.  Depression on Effexor.  Chronic anemia on iron and B12 and folate supplements.  Thrombocytopenia likely from cirrhosis follow CBC.  Restless leg syndrome on Requip.  GERD on PPI and Pepcid.  History of copper deficiency  -being followed by gastroenterologist. -Could be contributing to cirrhosis above         DVT prophylaxis:  heparin injection 5,000 Units Start: 04/06/23 0600  Code Status:   Code Status: Limited: Do not attempt resuscitation (DNR) -DNR-LIMITED -Do Not Intubate/DNI  Level of care: Progressive Status is: Inpatient   Subjective: Patient complaining of pain.  Oriented to herself being in the hospital due to leg pain and redness.  No other complaints currently.  Objective: Vitals:   04/06/23 1200 04/06/23 1217 04/06/23 1400 04/06/23 1500  BP: (!) 110/50  (!) 101/59 (!) 99/47  Pulse: 98   95 91  Resp: (!) 27  18 18   Temp:  98.5 F (36.9 C)  99.4 F (37.4 C)  TempSrc:  Oral    SpO2: 95%  97% 99%  Weight:      Height:        Intake/Output Summary (Last 24 hours) at 04/06/2023 1534 Last data filed at 04/06/2023 1356 Gross per 24 hour  Intake 103.21 ml  Output --  Net 103.21 ml   Filed Weights   04/05/23 2301  Weight: 94.6 kg   Body mass index is 38.15 kg/m.  Gen: 68 y.o. female in no apparent distress.  Nontoxic Pulm: Non-labored breathing.  Clear to auscultation bilaterally.  CV: Regular rate and rhythm. No murmur, rub, or gallop. No JVD GI: Abdomen soft, non-tender, non-distended, with normoactive bowel sounds. No organomegaly or masses felt. Ext: Warm, no deformities, obese and +1 edema bilateral ankles pedal edema Skin: With edema medial aspect of right thigh.  Splotchy appearing erythema without any evidence of excoriation or break in superficial skin. Neuro: Alert and oriented. No focal neurological deficits. Psych: Calm  Judgement and insight appear normal. Mood & affect appropriate.  Tearful initially secondary to pain   I have personally reviewed the following labs and images: CBC: Recent Labs  Lab 03/31/23 0947 04/02/23 0640 04/03/23 0506 04/05/23 2324 04/06/23 0632  WBC 5.1 5.7 5.0 5.7 8.7  NEUTROABS 2.9 3.0 2.7 5.1 7.5  HGB 9.1* 9.2* 9.0* 11.7* 9.4*  HCT 30.1* 30.7* 30.4* 37.0 30.3*  MCV 96.8 97.8 98.4 93.9 94.4  PLT 117* 129* 105* 119* 81*   BMP &GFR Recent Labs  Lab 03/31/23 0947 04/02/23 0640 04/03/23 0506 04/05/23 2324 04/06/23 0632  NA 139 138 138 136 133*  K 3.5 3.6 3.7 3.8 3.3*  CL 118* 117* 116* 112* 110  CO2 17* 18* 18* 15* 16*  GLUCOSE 127* 101* 111* 111* 120*  BUN 17 15 14 18 19   CREATININE 0.89 0.74 0.85 1.12* 1.23*  CALCIUM 8.3* 8.0* 7.8* 8.5* 8.1*  MG  --  1.6* 1.8  --   --   PHOS  --  3.9 3.8  --   --    Estimated Creatinine Clearance: 48.9 mL/min (A) (by C-G formula based on SCr of 1.23 mg/dL (H)). Liver &  Pancreas: Recent Labs  Lab 03/31/23 0947 04/02/23 0640 04/03/23 0506 04/05/23 2324 04/06/23 0632  AST 27 24 24  40 47*  ALT 19 18 17 21 20   ALKPHOS 68 73 71 94 60  BILITOT 2.0* 1.8* 1.7* 2.5* 2.1*  PROT 4.5* 4.7* 4.5* 6.3* 4.8*  ALBUMIN 2.2* 2.3* 2.1* 3.0* 2.3*   No results for input(s): "LIPASE", "AMYLASE" in the last 168 hours. Recent Labs  Lab 04/06/23 0116  AMMONIA 46*   Diabetic: No results for input(s): "HGBA1C" in the last 72 hours. No results for input(s): "GLUCAP" in the last 168 hours. Cardiac Enzymes: No results for input(s): "CKTOTAL", "CKMB", "CKMBINDEX", "TROPONINI" in the last 168 hours. No results for input(s): "PROBNP" in the last 8760 hours. Coagulation Profile: Recent Labs  Lab 04/06/23 0003  INR 1.8*   Thyroid Function Tests: No results for input(s): "TSH", "T4TOTAL", "FREET4", "T3FREE", "THYROIDAB" in the last 72 hours. Lipid Profile: No results for input(s): "  CHOL", "HDL", "LDLCALC", "TRIG", "CHOLHDL", "LDLDIRECT" in the last 72 hours. Anemia Panel: No results for input(s): "VITAMINB12", "FOLATE", "FERRITIN", "TIBC", "IRON", "RETICCTPCT" in the last 72 hours. Urine analysis:    Component Value Date/Time   COLORURINE YELLOW 03/29/2023 1651   APPEARANCEUR HAZY (A) 03/29/2023 1651   LABSPEC 1.025 03/29/2023 1651   PHURINE 5.0 03/29/2023 1651   GLUCOSEU NEGATIVE 03/29/2023 1651   HGBUR NEGATIVE 03/29/2023 1651   BILIRUBINUR NEGATIVE 03/29/2023 1651   KETONESUR NEGATIVE 03/29/2023 1651   PROTEINUR NEGATIVE 03/29/2023 1651   UROBILINOGEN 0.2 08/21/2013 0017   NITRITE NEGATIVE 03/29/2023 1651   LEUKOCYTESUR NEGATIVE 03/29/2023 1651   Sepsis Labs: Invalid input(s): "PROCALCITONIN", "LACTICIDVEN"  Microbiology: Recent Results (from the past 240 hours)  Culture, blood (Routine x 2)     Status: None (Preliminary result)   Collection Time: 04/05/23 11:24 PM   Specimen: BLOOD  Result Value Ref Range Status   Specimen Description   Final     BLOOD LEFT ANTECUBITAL Performed at [facility], [address], [place], [place] [address]    Special Requests   Final    BOTTLES DRAWN AEROBIC AND ANAEROBIC Blood Culture adequate volume Performed at [facility], [address] [place], [place] [address]    Culture   Final    NO GROWTH < 12 HOURS Performed at [facility], [address] [place], [place] [address]    Report Status PENDING  Incomplete  Culture, blood (Routine x 2)     Status: None (Preliminary result)   Collection Time: 04/06/23 12:03 AM   Specimen: BLOOD LEFT FOREARM  Result Value Ref Range Status   Specimen Description   Final    BLOOD LEFT FOREARM Performed at [facility], [address], [place], [place] [address]    Special Requests   Final    BOTTLES DRAWN AEROBIC AND ANAEROBIC Blood Culture results may not be optimal due to an inadequate volume of blood received in culture bottles Performed at [facility], [address] [place], [place] [address]    Culture   Final    NO GROWTH < 12 HOURS Performed at [facility], [address] [place], [place] [address]    Report Status PENDING  Incomplete    Radiology Studies: CT FEMUR RIGHT WO CONTRAST Result Date: 04/06/2023 CLINICAL DATA:  Cellulitis of the right leg. EXAM: CT OF THE LOWER RIGHT EXTREMITY WITHOUT CONTRAST TECHNIQUE: Multidetector CT imaging of the right lower extremity was performed according to the standard protocol. RADIATION DOSE REDUCTION: This exam was performed according to the departmental dose-optimization program which includes automated exposure control, adjustment of the mA and/or kV according to patient size and/or use of iterative reconstruction technique. COMPARISON:  CT of the right femur dated 03/07/2022. FINDINGS: Bones/Joint/Cartilage No acute osseous abnormality identified. No evidence of acute osteolysis or erosive changes. Moderate  tricompartmental degenerative changes of the right knee are again noted manifested by joint space narrowing, marginal osteophytosis, and subchondral sclerosis. Ligaments Ligaments are suboptimally evaluated by CT. Muscles and Tendons No discrete intramuscular collection identified. Muscle bulk is unchanged. Soft tissue Diffuse infiltration and stranding of the subcutaneous fat of the lateral and medial right thigh, extending to the superficial fascia of the underlying anterior and posterior compartment musculature. There is overlying cutaneous thickening. No discrete loculated collection identified. No soft tissue gas. No enlarged lymph nodes identified in the field of view. Vascular calcifications of the right lower  extremity. Calcified uterine fibroids are again noted. IMPRESSION: 1. Diffuse fat stranding of the subcutaneous lateral and medial right thigh, extending to the superficial fascia of the underlying anterior and posterior compartment musculature, concerning for cellulitis. These findings are similar to slightly increased compared to the prior exam. No discrete loculated fluid collection identified. No soft tissue gas. 2. No acute osseous abnormality. Electronically Signed   By: Hart Robinsons M.D.   On: 04/06/2023 08:59   DG Chest 2 View Result Date: 04/06/2023 CLINICAL DATA:  Suspected sepsis.  Weakness and confusion. EXAM: CHEST - 2 VIEW COMPARISON:  03/07/2023 FINDINGS: Shallow inspiration. Heart size and pulmonary vascularity are normal for technique. Lungs are clear. No pleural effusion or pneumothorax. Mediastinal contours appear intact. Calcification of the aorta. Surgical clips in the right upper quadrant. IMPRESSION: Shallow inspiration.  No evidence of active pulmonary disease. Electronically Signed   By: Burman Nieves M.D.   On: 04/06/2023 00:04    Scheduled Meds:  atorvastatin  40 mg Oral q AM   [START ON 04/07/2023] cyanocobalamin  2,000 mcg Oral Q Fri   famotidine  20 mg Oral q  AM   [START ON 04/07/2023] ferrous sulfate  325 mg Oral Q breakfast   folic acid  1 mg Oral Daily   heparin  5,000 Units Subcutaneous Q8H   lactulose  20 g Oral TID   levothyroxine  175 mcg Oral Q0600   pantoprazole  40 mg Oral BID AC   rifaximin  550 mg Oral BID   rOPINIRole  0.25 mg Oral QHS   venlafaxine XR  75 mg Oral Q breakfast   Continuous Infusions:  cefTRIAXone (ROCEPHIN)  IV Stopped (04/06/23 1356)   lactated ringers 75 mL/hr at 04/06/23 0917   metronidazole 500 mg (04/06/23 1359)   [START ON 04/07/2023] vancomycin       LOS: 0 days   35 minutes with more than 50% spent in reviewing records, counseling patient/family and coordinating care.  Tobey Grim, MD [facility] [email or web address] 04/06/2023, 3:34 PM

## 2023-04-06 NOTE — ED Notes (Signed)
 RN assisted patient in calling her son, VM left

## 2023-04-06 NOTE — ED Notes (Signed)
 ED TO INPATIENT HANDOFF REPORT  ED Nurse Name and Phone #: Crist Infante, RN [phone]  S Name/Age/Gender [name] 68 y.o. female Room/Bed: WA13/WA13  Code Status   Code Status: Limited: Do not attempt resuscitation (DNR) -DNR-LIMITED -Do Not Intubate/DNI   Home/SNF/Other Unknown if patient came from home  Patient oriented to: self, place, time, and situation Is this baseline? Yes   Triage Complete: Triage complete  Chief Complaint Sepsis [facility]) [A41.9]  Triage Note Confusion that started tonight, hx of hepatic encephalopathy. Pt is lethargic, weak. Son states he gave pt lactulose PTA.    Allergies Allergies  Allergen Reactions   Ace Inhibitors Anaphylaxis and Swelling   Ivp Dye [Iodinated Contrast Media] Anaphylaxis   Desitin [Zinc Oxide] Rash and Other (See Comments)    Worsens rash   Gadolinium Derivatives Other (See Comments)    Reaction??   Hydroxyzine Other (See Comments)    Affects the mind   Chlorhexidine Rash and Other (See Comments)    WORSENS RASHES   Doxycycline Rash   Penicillins Rash   Zofran [Ondansetron] Rash    Level of Care/Admitting Diagnosis ED Disposition     ED Disposition  Admit   Condition  --   Comment  Hospital Area: [facility]  HOSPITAL [[id number]]  Level of Care: Progressive [102]  Admit to Progressive based on following criteria: MULTISYSTEM THREATS such as stable sepsis, metabolic/electrolyte imbalance with or without encephalopathy that is responding to early treatment.  May admit patient to [facility] or [facility] if equivalent level of care is available:: Yes  Covid Evaluation: Asymptomatic - no recent exposure (last 10 days) testing not required  Diagnosis: Sepsis [facility]) [[id number]]  Admitting Physician: Eduard Clos [phone]  Attending Physician: Eduard Clos [phone]  Certification:: I certify this patient will need inpatient services for at least 2 midnights  Expected Medical Readiness: 04/08/2023           B Medical/Surgery History 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   Obesity    Pulmonary emboli (HCC) 01/10/2009   Pulmonary embolism (HCC)    Short gut syndrome    Thyroid disease    Past Surgical History:  Procedure Laterality Date   ABDOMINAL WALL MESH  REMOVAL     2016   CESAREAN SECTION WITH BILATERAL TUBAL LIGATION  04/30/1985   CHOLECYSTECTOMY  05/2007   Exploratory laparotomy, lysis of adhesions, takedown of ileostomy with small bowel resection, open cholecystectomy and ileocolostomy   COLON SURGERY  01/2007   exploratory laparotomy with right colecotmy and end ileostomy   COLONOSCOPY  04/02/2019   Dr Clifton James Elie Confer, MD [facility]   ESOPHAGOGASTRODUODENOSCOPY (EGD) WITH PROPOFOL N/A 10/19/2022   Procedure: ESOPHAGOGASTRODUODENOSCOPY (EGD) WITH PROPOFOL;  Surgeon: Kerin Salen, MD;  Location: [facility];  Service: Gastroenterology;  Laterality: N/A;   ESOPHAGOSCOPY  04/02/2019   EYE SURGERY Left 10/06/2020   Cataract extraciton w/intraocular lens implant- Dr Valere Dross   HERNIA REPAIR  10/2007  ILEOSTOMY  2009   states bowel perfotation wih colonsocopy   ILEOSTOMY CLOSURE  2009   INCISIONAL HERNIA REPAIR  10/11/2018   IR KYPHO THORACIC WITH BONE BIOPSY  08/31/2022   IR KYPHO THORACIC WITH BONE BIOPSY  10/27/2022   IR RADIOLOGIST EVAL & MGMT  09/27/2022   KIDNEY STONE SURGERY     LIVER RESECTION     2009   PANNICULECTOMY     repair of abdominal wall hernia   WRIST SURGERY     tendonitis     A IV  Location/Drains/Wounds Patient Lines/Drains/Airways Status     Active Line/Drains/Airways     Name Placement date Placement time Site Days   Peripheral IV 04/06/23 20 G 1" Anterior;Right Hand 04/06/23  0932  Hand  less than 1   Wound / Incision (Open or Dehisced) 05/02/22 (ITD) Intertriginous Dermatitis Breast Left;Lower 05/02/22  0200  Breast  339   Wound / Incision (Open or Dehisced) 05/02/22 (ITD) Intertriginous Dermatitis Groin Bilateral 05/02/22  0200  Groin  339   Wound / Incision (Open or Dehisced) 05/02/22 (ITD) Intertriginous Dermatitis Knee Posterior;Bilateral 05/02/22  0200  Knee  339   Wound / Incision (Open or Dehisced) 10/18/22 Non-pressure wound;Irritant Dermatitis (Moisture Associated Skin Damage);Other (Comment) Coccyx Mid;Upper 10/18/22  2000  Coccyx  170   Wound / Incision (Open or Dehisced) 01/06/23 Other (Comment) Toe (Comment  which one) Anterior;Left Patient whole great big toenail fell off, slight bleeding. 01/06/23  1045  Toe (Comment  which one)  90   Wound / Incision (Open or Dehisced) 01/06/23 Skin tear Thigh Left;Posterior;Proximal 01/06/23  1745  Thigh  90   Wound / Incision (Open or Dehisced) 01/06/23 Skin tear Thigh Posterior;Proximal;Right 01/06/23  1746  Thigh  90   Wound / Incision (Open or Dehisced) 02/07/23 Non-pressure wound Knee Posterior;Bilateral 02/07/23  2117  Knee  58            Intake/Output Last 24 hours  Intake/Output Summary (Last 24 hours) at 04/06/2023 1413 Last data filed at 04/06/2023 1356 Gross per 24 hour  Intake 103.21 ml  Output --  Net 103.21 ml    Labs/Imaging Results for orders placed or performed during the hospital encounter of 04/05/23 (from the past 48 hours)  Comprehensive metabolic panel     Status: Abnormal   Collection Time: 04/05/23 11:24 PM  Result Value Ref Range   Sodium 136 135 - 145 mmol/L   Potassium 3.8 3.5 - 5.1 mmol/L   Chloride 112 (H) 98 - 111 mmol/L   CO2 15 (L) 22 - 32 mmol/L   Glucose, Bld 111 (H)  70 - 99 mg/dL    Comment: Glucose reference range applies only to samples taken after fasting for at least 8 hours.   BUN 18 8 - 23 mg/dL   Creatinine, Ser 1.61 (H) 0.44 - 1.00 mg/dL   Calcium 8.5 (L) 8.9 - 10.3 mg/dL   Total Protein 6.3 (L) 6.5 - 8.1 g/dL   Albumin 3.0 (L) 3.5 - 5.0 g/dL   AST 40 15 - 41 U/L   ALT 21 0 - 44 U/L   Alkaline Phosphatase 94 38 - 126 U/L   Total Bilirubin 2.5 (H) 0.0 - 1.2 mg/dL   GFR, Estimated 55 (L) >60 mL/min    Comment: (NOTE) Calculated using the CKD-EPI Creatinine Equation (2021)    Anion gap 9 5 - 15    Comment: Performed at [facility], [address] [place], [place]  [address]  CBC with Differential     Status: Abnormal   Collection Time: 04/05/23 11:24 PM  Result Value Ref Range   WBC 5.7 4.0 - 10.5 K/uL   RBC 3.94 3.87 - 5.11 MIL/uL   Hemoglobin 11.7 (L) 12.0 - 15.0 g/dL   HCT 29.5 62.1 - 30.8 %   MCV 93.9 80.0 - 100.0 fL   MCH 29.7 26.0 - 34.0 pg   MCHC 31.6 30.0 - 36.0 g/dL   RDW 65.7 (H) 84.6 - 96.2 %   Platelets 119 (L) 150 - 400 K/uL   nRBC 0.0 0.0 - 0.2 %   Neutrophils Relative % 90 %   Neutro Abs 5.1 1.7 - 7.7 K/uL   Lymphocytes Relative 5 %   Lymphs Abs 0.3 (L) 0.7 - 4.0 K/uL   Monocytes Relative 2 %   Monocytes Absolute 0.1 0.1 - 1.0 K/uL   Eosinophils Relative 2 %   Eosinophils Absolute 0.1 0.0 - 0.5 K/uL   Basophils Relative 0 %   Basophils Absolute 0.0 0.0 - 0.1 K/uL   Immature Granulocytes 1 %   Abs Immature Granulocytes 0.04 0.00 - 0.07 K/uL    Comment: Performed at [facility], [address], [place], [place] [address]  Culture, blood (Routine x 2)     Status: None (Preliminary result)   Collection Time: 04/05/23 11:24 PM   Specimen: BLOOD  Result Value Ref Range   Specimen Description      BLOOD LEFT ANTECUBITAL Performed at [facility], [address], [place], [place] [address]    Special Requests      BOTTLES DRAWN AEROBIC AND ANAEROBIC Blood  Culture adequate volume Performed at [facility], [address], [place], [place] [address]    Culture      NO GROWTH < 12 HOURS Performed at [facility], [address] [place], [place] [address]    Report Status PENDING   Culture, blood (Routine x 2)     Status: None (Preliminary result)   Collection Time: 04/06/23 12:03 AM   Specimen: BLOOD LEFT FOREARM  Result Value Ref Range   Specimen Description      BLOOD LEFT FOREARM Performed at [facility], [address] [place], [place] [address]    Special Requests      BOTTLES DRAWN AEROBIC AND ANAEROBIC Blood Culture results may not be optimal due to an inadequate volume of blood received in culture bottles Performed at [facility], [address], [place], [place] [address]    Culture      NO GROWTH < 12 HOURS Performed at [facility], [address], [place], [place] [address]    Report Status PENDING   Protime-INR     Status: Abnormal   Collection Time: 04/06/23 12:03 AM  Result Value Ref Range   Prothrombin Time 21.3 (H) 11.4 - 15.2 seconds   INR 1.8 (H) 0.8 - 1.2    Comment: (NOTE) INR goal varies based on device and disease states. Performed at [facility], [address] [place], [place] [address]   I-Stat Lactic Acid, ED     Status: Abnormal   Collection Time: 04/06/23 12:30 AM  Result Value Ref Range   Lactic Acid, Venous 3.3 (HH) 0.5 - 1.9 mmol/L   Comment NOTIFIED PHYSICIAN   Ammonia     Status: Abnormal   Collection Time: 04/06/23  1:16 AM  Result Value  Ref Range   Ammonia 46 (H) 9 - 35 umol/L    Comment: Performed at [facility], [address], [place], [place] [address]  Basic metabolic panel     Status: Abnormal   Collection Time: 04/06/23  6:32 AM  Result Value Ref Range   Sodium 133 (L) 135 - 145 mmol/L   Potassium 3.3 (L) 3.5 - 5.1 mmol/L   Chloride 110 98 - 111 mmol/L   CO2 16 (L)  22 - 32 mmol/L   Glucose, Bld 120 (H) 70 - 99 mg/dL    Comment: Glucose reference range applies only to samples taken after fasting for at least 8 hours.   BUN 19 8 - 23 mg/dL   Creatinine, Ser 6.04 (H) 0.44 - 1.00 mg/dL   Calcium 8.1 (L) 8.9 - 10.3 mg/dL   GFR, Estimated 49 (L) >60 mL/min    Comment: (NOTE) Calculated using the CKD-EPI Creatinine Equation (2021)    Anion gap 7 5 - 15    Comment: Performed at [facility], [address], [place], [place] [address]  Hepatic function panel     Status: Abnormal   Collection Time: 04/06/23  6:32 AM  Result Value Ref Range   Total Protein 4.8 (L) 6.5 - 8.1 g/dL   Albumin 2.3 (L) 3.5 - 5.0 g/dL   AST 47 (H) 15 - 41 U/L   ALT 20 0 - 44 U/L   Alkaline Phosphatase 60 38 - 126 U/L   Total Bilirubin 2.1 (H) 0.0 - 1.2 mg/dL   Bilirubin, Direct 0.6 (H) 0.0 - 0.2 mg/dL   Indirect Bilirubin 1.5 (H) 0.3 - 0.9 mg/dL    Comment: Performed at [facility], [address], [place], [place] [address]  CBC with Differential/Platelet     Status: Abnormal   Collection Time: 04/06/23  6:32 AM  Result Value Ref Range   WBC 8.7 4.0 - 10.5 K/uL   RBC 3.21 (L) 3.87 - 5.11 MIL/uL   Hemoglobin 9.4 (L) 12.0 - 15.0 g/dL   HCT 78.2 (L) 95.6 - 21.3 %   MCV 94.4 80.0 - 100.0 fL   MCH 29.3 26.0 - 34.0 pg   MCHC 31.0 30.0 - 36.0 g/dL   RDW 08.6 (H) 57.8 - 46.9 %   Platelets 81 (L) 150 - 400 K/uL    Comment: Immature Platelet Fraction may be clinically indicated, consider ordering this additional test [id number]    nRBC 0.0 0.0 - 0.2 %   Neutrophils Relative % 86 %   Neutro Abs 7.5 1.7 - 7.7 K/uL   Lymphocytes Relative 6 %   Lymphs Abs 0.5 (L) 0.7 - 4.0 K/uL   Monocytes Relative 6 %   Monocytes Absolute 0.5 0.1 - 1.0 K/uL   Eosinophils Relative 0 %   Eosinophils Absolute 0.0 0.0 - 0.5 K/uL   Basophils Relative 0 %   Basophils Absolute 0.0 0.0 - 0.1 K/uL   Smear Review Normal platelet morphology    Immature  Granulocytes 2 %   Abs Immature Granulocytes 0.13 (H) 0.00 - 0.07 K/uL    Comment: Performed at [facility], [address] [place], [place] [address]   CT FEMUR RIGHT WO CONTRAST Result Date: 04/06/2023 CLINICAL DATA:  Cellulitis of the right leg. EXAM: CT OF THE LOWER RIGHT EXTREMITY WITHOUT CONTRAST TECHNIQUE: Multidetector CT imaging of the right lower extremity was performed according to the standard protocol. RADIATION DOSE REDUCTION: This exam was performed  according to the departmental dose-optimization program which includes automated exposure control, adjustment of the mA and/or kV according to patient size and/or use of iterative reconstruction technique. COMPARISON:  CT of the right femur dated 03/07/2022. FINDINGS: Bones/Joint/Cartilage No acute osseous abnormality identified. No evidence of acute osteolysis or erosive changes. Moderate tricompartmental degenerative changes of the right knee are again noted manifested by joint space narrowing, marginal osteophytosis, and subchondral sclerosis. Ligaments Ligaments are suboptimally evaluated by CT. Muscles and Tendons No discrete intramuscular collection identified. Muscle bulk is unchanged. Soft tissue Diffuse infiltration and stranding of the subcutaneous fat of the lateral and medial right thigh, extending to the superficial fascia of the underlying anterior and posterior compartment musculature. There is overlying cutaneous thickening. No discrete loculated collection identified. No soft tissue gas. No enlarged lymph nodes identified in the field of view. Vascular calcifications of the right lower extremity. Calcified uterine fibroids are again noted. IMPRESSION: 1. Diffuse fat stranding of the subcutaneous lateral and medial right thigh, extending to the superficial fascia of the underlying anterior and posterior compartment musculature, concerning for cellulitis. These findings are similar to slightly increased compared to  the prior exam. No discrete loculated fluid collection identified. No soft tissue gas. 2. No acute osseous abnormality. Electronically Signed   By: Hart Robinsons M.D.   On: 04/06/2023 08:59   DG Chest 2 View Result Date: 04/06/2023 CLINICAL DATA:  Suspected sepsis.  Weakness and confusion. EXAM: CHEST - 2 VIEW COMPARISON:  03/07/2023 FINDINGS: Shallow inspiration. Heart size and pulmonary vascularity are normal for technique. Lungs are clear. No pleural effusion or pneumothorax. Mediastinal contours appear intact. Calcification of the aorta. Surgical clips in the right upper quadrant. IMPRESSION: Shallow inspiration.  No evidence of active pulmonary disease. Electronically Signed   By: Burman Nieves M.D.   On: 04/06/2023 00:04    Pending Labs Unresulted Labs (From admission, onward)     Start     Ordered   04/06/23 0524  CBC  (heparin)  Once,   R       Comments: Baseline for heparin therapy IF NOT ALREADY DRAWN.  Notify MD if PLT < 100 K.    04/06/23 0525   04/05/23 2304  Urinalysis, w/ Reflex to Culture (Infection Suspected) -Urine, Catheterized  Once,   URGENT       Question:  Specimen Source  Answer:  Urine, Catheterized   04/05/23 2303            Vitals/Pain Today's Vitals   04/06/23 1200 04/06/23 1217 04/06/23 1231 04/06/23 1400  BP: (!) 110/50   (!) 101/59  Pulse: 98   95  Resp: (!) 27   18  Temp:  98.5 F (36.9 C)    TempSrc:  Oral    SpO2: 95%   97%  Weight:      Height:      PainSc:   6      Isolation Precautions No active isolations  Medications Medications  lactated ringers infusion ( Intravenous New Bag/Given 04/06/23 0917)  rifaximin (XIFAXAN) tablet 550 mg (550 mg Oral Given 04/06/23 0948)  atorvastatin (LIPITOR) tablet 40 mg (40 mg Oral Given 04/06/23 0623)  venlafaxine XR (EFFEXOR-XR) 24 hr capsule 75 mg (75 mg Oral Given 04/06/23 0916)  levothyroxine (SYNTHROID) tablet 175 mcg (175 mcg Oral Given 04/06/23 0622)  famotidine (PEPCID) tablet 20 mg (20  mg Oral Given 04/06/23 0622)  lactulose (CHRONULAC) 10 GM/15ML solution 20 g (20 g Oral Not Given 04/06/23 0920)  pantoprazole (  PROTONIX) EC tablet 40 mg (40 mg Oral Given 04/06/23 0916)  cyanocobalamin (VITAMIN B12) tablet 2,000 mcg (has no administration in time range)  folic acid (FOLVITE) tablet 1 mg (1 mg Oral Given 04/06/23 0916)  rOPINIRole (REQUIP) tablet 0.25 mg (has no administration in time range)  heparin injection 5,000 Units (5,000 Units Subcutaneous Given 04/06/23 1359)  cefTRIAXone (ROCEPHIN) 2 g in sodium chloride 0.9 % 100 mL IVPB (0 g Intravenous Stopped 04/06/23 1356)  metroNIDAZOLE (FLAGYL) IVPB 500 mg (500 mg Intravenous New Bag/Given 04/06/23 1359)  vancomycin (VANCOREADY) IVPB 1250 mg/250 mL (has no administration in time range)  acetaminophen (TYLENOL) tablet 650 mg (has no administration in time range)  ferrous sulfate tablet 325 mg (has no administration in time range)  lactated ringers bolus 1,000 mL (0 mLs Intravenous Stopped 04/06/23 0317)    And  lactated ringers bolus 1,000 mL (0 mLs Intravenous Stopped 04/06/23 0526)    And  lactated ringers bolus 1,000 mL (0 mLs Intravenous Stopped 04/06/23 0628)  ceFEPIme (MAXIPIME) 2 g in sodium chloride 0.9 % 100 mL IVPB (0 g Intravenous Stopped 04/06/23 0146)  metroNIDAZOLE (FLAGYL) IVPB 500 mg (0 mg Intravenous Stopped 04/06/23 0317)  vancomycin (VANCOCIN) IVPB 1000 mg/200 mL premix (0 mg Intravenous Stopped 04/06/23 0421)  fentaNYL (SUBLIMAZE) injection 12.5 mcg (12.5 mcg Intravenous Given 04/06/23 9562)    Mobility walks with device     Focused Assessments Neuro Assessment Handoff:  Swallow screen pass?  N/A         Neuro Assessment: Exceptions to WDL Neuro Checks:      Has TPA been given? No If patient is a Neuro Trauma and patient is going to OR before floor call report to 4N Charge nurse: [phone] or [phone]   R Recommendations: See Admitting Provider Note  Report given to:   Additional Notes:

## 2023-04-06 NOTE — Progress Notes (Signed)
 PHARMACY - PHYSICIAN COMMUNICATION CRITICAL VALUE ALERT - BLOOD CULTURE IDENTIFICATION (BCID)  [name] is an 68 y.o. female who presented to [facility] on 04/05/2023 with a chief complaint of  Chief Complaint  Patient presents with   Weakness   Altered Mental Status     Assessment:  1/4 bottles showing GNR, no BCID   Name of physician (or Provider) Contacted: Anthoney Harada, NP  Current antibiotics: vancomycin, ceftriaxone, metronidazole - per MD note today, treating for cellulitis as well as possible cat scratch disease  Changes to prescribed antibiotics recommended:  - continue vancomycin for cellulitis Stop metronidazole and ceftriaxone - Start cefepime 2 gr IV q12h to cover for GNR - Start azithromycin 500 mg IV q24h  to cover Cat scratch disease  Results for orders placed or performed during the hospital encounter of 04/05/23  Blood Culture ID Panel (Reflexed) (Collected: 04/05/2023 11:24 PM)  Result Value Ref Range   Enterococcus faecalis NOT DETECTED NOT DETECTED   Enterococcus Faecium NOT DETECTED NOT DETECTED   Listeria monocytogenes NOT DETECTED NOT DETECTED   Staphylococcus species NOT DETECTED NOT DETECTED   Staphylococcus aureus (BCID) NOT DETECTED NOT DETECTED   Staphylococcus epidermidis NOT DETECTED NOT DETECTED   Staphylococcus lugdunensis NOT DETECTED NOT DETECTED   Streptococcus species NOT DETECTED NOT DETECTED   Streptococcus agalactiae NOT DETECTED NOT DETECTED   Streptococcus pneumoniae NOT DETECTED NOT DETECTED   Streptococcus pyogenes NOT DETECTED NOT DETECTED   A.calcoaceticus-baumannii NOT DETECTED NOT DETECTED   Bacteroides fragilis NOT DETECTED NOT DETECTED   Enterobacterales NOT DETECTED NOT DETECTED   Enterobacter cloacae complex NOT DETECTED NOT DETECTED   Escherichia coli NOT DETECTED NOT DETECTED   Klebsiella aerogenes NOT DETECTED NOT DETECTED   Klebsiella oxytoca NOT DETECTED NOT DETECTED   Klebsiella pneumoniae NOT DETECTED NOT  DETECTED   Proteus species NOT DETECTED NOT DETECTED   Salmonella species NOT DETECTED NOT DETECTED   Serratia marcescens NOT DETECTED NOT DETECTED   Haemophilus influenzae NOT DETECTED NOT DETECTED   Neisseria meningitidis NOT DETECTED NOT DETECTED   Pseudomonas aeruginosa NOT DETECTED NOT DETECTED   Stenotrophomonas maltophilia NOT DETECTED NOT DETECTED   Candida albicans NOT DETECTED NOT DETECTED   Candida auris NOT DETECTED NOT DETECTED   Candida glabrata NOT DETECTED NOT DETECTED   Candida krusei NOT DETECTED NOT DETECTED   Candida parapsilosis NOT DETECTED NOT DETECTED   Candida tropicalis NOT DETECTED NOT DETECTED   Cryptococcus neoformans/gattii NOT DETECTED NOT DETECTED     Adalberto Cole, PharmD, BCPS 04/06/2023 8:32 PM

## 2023-04-06 NOTE — ED Notes (Signed)
 RN assisted patient in calling [name]. She voiced to son that she has not eaten all day and wanted a [employer] biscuit. RN spoke to son and informed him that his mom did nibble on breakfast tray and totally refused lunch tray which is still on patient bedside table. She voiced she wanted real food not this mess. RN offered other alternatives for food and nutrition but patient refused. Her son remained on phone and agreed to bring patient something.

## 2023-04-06 NOTE — Progress Notes (Signed)
 Pharmacy Antibiotic Note  [name] is a 68 y.o. female admitted on 04/05/2023 with cellulitis of right leg/sepsis.  Pharmacy has been consulted for Vancomycin dosing.  In ED received Vancomycin 1gm, Metronidazole 500mg  IV, Cefepime 2g IV x 1 dose each.   Plan: Vancomycin 1250 mg IV Q 36 hrs. Goal AUC 400-550.  Expected AUC: 472.7  SCr used: 1.12 Ceftriaxone per MD Metronidazole per MD Follow renal function F/u culture results & sensitivities  Height: 5\' 2"  (157.5 cm) Weight: 94.6 kg (208 lb 8.9 oz) IBW/kg (Calculated) : 50.1  Temp (24hrs), Avg:100 F (37.8 C), Min:98.5 F (36.9 C), Max:103.1 F (39.5 C)  Recent Labs  Lab 03/30/23 0644 03/31/23 0947 04/02/23 0640 04/03/23 0506 04/05/23 2324 04/06/23 0030  WBC 5.2 5.1 5.7 5.0 5.7  --   CREATININE 1.07* 0.89 0.74 0.85 1.12*  --   LATICACIDVEN  --   --   --   --   --  3.3*    Estimated Creatinine Clearance: 53.7 mL/min (A) (by C-G formula based on SCr of 1.12 mg/dL (H)).    Allergies  Allergen Reactions   Ace Inhibitors Anaphylaxis and Swelling   Ivp Dye [Iodinated Contrast Media] Anaphylaxis   Desitin [Zinc Oxide] Rash and Other (See Comments)    Worsens rash   Gadolinium Derivatives Other (See Comments)    Reaction??   Hydroxyzine Other (See Comments)    Affects the mind   Chlorhexidine Rash and Other (See Comments)    WORSENS RASHES   Doxycycline Rash   Penicillins Rash   Zofran [Ondansetron] Rash    Antimicrobials this admission: 3/27 Cefepime x 1 3/27  Ceftriaxone >>   3/27 Metronidazole >> 3/27 Vancomycin >>  Dose adjustments this admission:    Microbiology results: 3/27 BCx:      Thank you for allowing pharmacy to be a part of this patient's care.  Maryellen Pile, PharmD 04/06/2023 5:38 AM

## 2023-04-07 ENCOUNTER — Encounter (HOSPITAL_COMMUNITY): Payer: Self-pay | Admitting: Internal Medicine

## 2023-04-07 DIAGNOSIS — N179 Acute kidney failure, unspecified: Secondary | ICD-10-CM | POA: Diagnosis not present

## 2023-04-07 DIAGNOSIS — R6520 Severe sepsis without septic shock: Secondary | ICD-10-CM | POA: Diagnosis not present

## 2023-04-07 DIAGNOSIS — A4150 Gram-negative sepsis, unspecified: Secondary | ICD-10-CM

## 2023-04-07 DIAGNOSIS — L03115 Cellulitis of right lower limb: Secondary | ICD-10-CM | POA: Diagnosis not present

## 2023-04-07 DIAGNOSIS — K529 Noninfective gastroenteritis and colitis, unspecified: Secondary | ICD-10-CM

## 2023-04-07 DIAGNOSIS — Z66 Do not resuscitate: Secondary | ICD-10-CM

## 2023-04-07 DIAGNOSIS — A419 Sepsis, unspecified organism: Secondary | ICD-10-CM | POA: Diagnosis not present

## 2023-04-07 LAB — COMPREHENSIVE METABOLIC PANEL WITH GFR
ALT: 24 U/L (ref 0–44)
AST: 55 U/L — ABNORMAL HIGH (ref 15–41)
Albumin: 2.1 g/dL — ABNORMAL LOW (ref 3.5–5.0)
Alkaline Phosphatase: 48 U/L (ref 38–126)
Anion gap: 7 (ref 5–15)
BUN: 24 mg/dL — ABNORMAL HIGH (ref 8–23)
CO2: 16 mmol/L — ABNORMAL LOW (ref 22–32)
Calcium: 7.6 mg/dL — ABNORMAL LOW (ref 8.9–10.3)
Chloride: 111 mmol/L (ref 98–111)
Creatinine, Ser: 1.3 mg/dL — ABNORMAL HIGH (ref 0.44–1.00)
GFR, Estimated: 46 mL/min — ABNORMAL LOW (ref >60)
Glucose, Bld: 88 mg/dL (ref 70–99)
Potassium: 3.8 mmol/L (ref 3.5–5.1)
Sodium: 134 mmol/L — ABNORMAL LOW (ref 135–145)
Total Bilirubin: 1.5 mg/dL — ABNORMAL HIGH (ref 0.0–1.2)
Total Protein: 4.7 g/dL — ABNORMAL LOW (ref 6.5–8.1)

## 2023-04-07 LAB — MAGNESIUM: Magnesium: 1.5 mg/dL — ABNORMAL LOW (ref 1.7–2.4)

## 2023-04-07 LAB — LACTIC ACID, PLASMA: Lactic Acid, Venous: 1.3 mmol/L (ref 0.5–1.9)

## 2023-04-07 MED ORDER — MAGNESIUM SULFATE 2 GM/50ML IV SOLN
2.0000 g | Freq: Once | INTRAVENOUS | Status: AC
  Administered 2023-04-07: 2 g via INTRAVENOUS
  Filled 2023-04-07: qty 50

## 2023-04-07 MED ORDER — SODIUM CHLORIDE 0.9 % IV SOLN
1.0000 g | Freq: Three times a day (TID) | INTRAVENOUS | Status: AC
  Administered 2023-04-07 – 2023-04-14 (×20): 1 g via INTRAVENOUS
  Filled 2023-04-07 (×20): qty 20

## 2023-04-07 MED ORDER — SODIUM BICARBONATE 650 MG PO TABS
1300.0000 mg | ORAL_TABLET | Freq: Two times a day (BID) | ORAL | Status: DC
  Administered 2023-04-07 – 2023-04-14 (×15): 1300 mg via ORAL
  Filled 2023-04-07 (×15): qty 2

## 2023-04-07 MED ORDER — AZITHROMYCIN 250 MG PO TABS
500.0000 mg | ORAL_TABLET | Freq: Every day | ORAL | Status: DC
  Administered 2023-04-07: 500 mg via ORAL
  Filled 2023-04-07: qty 2

## 2023-04-07 MED ORDER — NYSTATIN 100000 UNIT/GM EX POWD
Freq: Three times a day (TID) | CUTANEOUS | Status: DC
  Administered 2023-04-09: 1 via TOPICAL
  Filled 2023-04-07: qty 15

## 2023-04-07 NOTE — Assessment & Plan Note (Addendum)
 04-07-2023 chronic   04-08-2023 chronic. stable

## 2023-04-07 NOTE — Assessment & Plan Note (Addendum)
 04-07-2023 blood cx growing GNR. ID consulted. On IV ABX with meropenem. Prior hx of ESBL pseudomonas septicemia in 09-2022  04-08-2023 blood cx growing FLAVOBACTERIUM ODORATUM. On IV meropenem due to resistance to cipro/zosyn. Will wait for ID to return on Monday to decide on length of treatment course.  04-09-2023 continue with IV meropenem.  Day # 3. Awaiting ID to give IV abx duration recommendations.  04-10-2023 Day #4 Meropenem. Awaiting ID f/u to determine duration of treatment. Due to right thigh cellulitis.  04-11-2023 Day #5 Meropenem. ID recs are to complete 7 days total of IV meropenem. Last day of IV AXB will be on 04-14-2023 then DC to home cefadroxil 1000 mg po bid to finish 7 more days

## 2023-04-07 NOTE — TOC Initial Note (Signed)
 Transition of Care [facility]) - Initial/Assessment Note   Patient Details  Name: [name] MRN: [id number] Date of Birth: February 26, 1955  Transition of Care [facility]) CM/SW Contact:    Ewing Schlein, LCSW Phone Number: 04/07/2023, 2:22 PM  Clinical Narrative: Patient is from home and is currently active with HHPT through [facility]. Patient has a walker, rollator, wheelchair, shower chair, hospital bed, and Hoyer lift at home. TOC to follow.  Expected Discharge Plan and Services In-house Referral: Clinical Social Work Post Acute Care Choice: Home Health Living arrangements for the past 2 months: Single Family Home           DME Arranged: N/A DME Agency: NA  Prior Living Arrangements/Services Living arrangements for the past 2 months: Single Family Home Lives with:: Adult Children Patient language and need for interpreter reviewed:: Yes Do you feel safe going back to the place where you live?: Yes      Need for Family Participation in Patient Care: Yes (Comment) Care giver support system in place?: Yes (comment) Current home services: DME, Home PT (Walker, rollator, wheelchair, shower chair, hospital bed, hoyer lift. Active with [facility] for HHPT.) Criminal Activity/Legal Involvement Pertinent to Current Situation/Hospitalization: No - Comment as needed  Activities of Daily Living ADL Screening (condition at time of admission) Independently performs ADLs?: No Does the patient have a NEW difficulty with bathing/dressing/toileting/self-feeding that is expected to last >3 days?: No Does the patient have a NEW difficulty with getting in/out of bed, walking, or climbing stairs that is expected to last >3 days?: No Does the patient have a NEW difficulty with communication that is expected to last >3 days?: No Is the patient deaf or have difficulty hearing?: Yes Does the patient have difficulty seeing, even when wearing glasses/contacts?: Yes Does the patient have difficulty concentrating,  remembering, or making decisions?: Yes (normally "no")  Emotional Assessment Orientation: : Oriented to Self, Oriented to Place, Oriented to  Time, Oriented to Situation Alcohol / Substance Use: Not Applicable Psych Involvement: No (comment)  Admission diagnosis:  Elevated lactic acid level [R79.89] Normochromic normocytic anemia [D64.9] Cellulitis of right thigh [L03.115] Elevated random blood glucose level [R73.9] Sepsis (HCC) [A41.9] Hepatic cirrhosis, unspecified hepatic cirrhosis type, unspecified whether ascites present (HCC) [K74.60] Patient Active Problem List   Diagnosis Date Noted   Sepsis (HCC) 04/06/2023   Cellulitis of right thigh 04/06/2023   ARF (acute renal failure) (HCC) 03/30/2023   Depression 03/30/2023   RLS (restless legs syndrome) 03/30/2023   Pruritus 03/06/2023   Edema of both lower legs 03/06/2023   Insomnia 03/06/2023   Compression fracture of body of thoracic vertebra (HCC) 03/06/2023   Anemia of chronic disease 02/22/2023   Lumbar radiculopathy 12/10/2022   Grade I diastolic dysfunction 12/10/2022   History of DVT (deep vein thrombosis) 12/01/2022   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   Hypomagnesemia 10/08/2022   DNR (do not resuscitate)/DNI(Do Not intubate) 10/05/2022   Vaginal bleeding 10/05/2022   Gram negative septicemia (HCC) 10/01/2022   Physical deconditioning 08/17/2022   Folate deficiency 06/14/2022   Systolic murmur 06/12/2022   Iron deficiency anemia 05/16/2022   Thrombocytopenia (HCC) - Baseline 100-130K 05/16/2022   History of eye surgery 05/01/2022   Corneal abnormality 04/29/2022   Status post eye surgery 04/29/2022   Corneal perforation of left eye 04/18/2022   Pseudophakia of left eye 02/08/2022   Physical debility 12/10/2021   Postmenopausal 11/24/2021   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   Polypharmacy 06/22/2021  Former smoker 01/06/2021   Combined form of age-related cataract, right eye 10/01/2020    Corneal scarring 09/10/2020   Cerebral atherosclerosis 08/20/2020   Short gut syndrome 08/20/2020   DDD (degenerative disc disease), lumbar 08/20/2020   Abdominal wall hernia 03/06/2020   Splenomegaly 02/06/2019   Vitamin B12 deficiency 01/21/2019   Depression, major, recurrent, moderate (HCC) 09/10/2018   Hyperuricemia 08/25/2016   GERD (gastroesophageal reflux disease) 07/25/2016   Hypothyroidism 07/25/2016   Microcytic anemia 04/12/2016   Vitamin D deficiency 05/04/2015   History of adenomatous polyp of colon 04/28/2015   H/O angioedema 11/15/2014   Obesity, Class II, BMI 35-39.9 07/25/2014   Chronic diarrhea 06/01/2014   PCP:  [name], MD Pharmacy:   [facility] [phone] - [place], [place] - [address] AT [facility] [address] [place] [address] Phone: [phone] Fax: [phone]  [place] [facility] [address] [place] [address] Phone: [phone] Fax: [phone]  Social Drivers of Health (SDOH) Social History: SDOH Screenings   Food Insecurity: No Food Insecurity (04/06/2023)  Housing: Low Risk  (04/06/2023)  Transportation Needs: No Transportation Needs (04/06/2023)  Utilities: Not At Risk (04/06/2023)  Depression (PHQ2-9): Medium Risk (03/06/2023)  Social Connections: Socially Isolated (04/06/2023)  Tobacco Use: Medium Risk (04/06/2023)   SDOH Interventions:    Readmission Risk Interventions    04/07/2023    1:23 PM 03/09/2023   11:21 AM 10/19/2022   10:12 AM  Readmission Risk Prevention Plan  Transportation Screening Complete Complete Complete  Medication Review Oceanographer) Complete Complete Complete  PCP or Specialist appointment within 3-5 days of discharge  Complete Complete  HRI or Home Care Consult Complete Complete Complete  SW Recovery Care/Counseling Consult Complete Complete Complete  Palliative Care Screening Not Applicable Not Applicable Not Applicable  Skilled Nursing  Facility Not Applicable Not Applicable Not Applicable

## 2023-04-07 NOTE — Progress Notes (Signed)
 PROGRESS NOTE    Ramey Ketcherside  NFA:[id number] DOB: [birth year] DOA: 04/05/2023 PCP: [name], MD  Subjective: Pt seen and examined. Pt's [name] as bedside. Pt and son are well known to me from prior admissions. No fevers. Son states right thigh redness developed within about 4 hours at home. Potential cat scratch at home.  Blood cx growing GNR. IV ABX change to IV meropenem. ID and ID pharmacist involved with pt's case.   Hospital Course: HPI:  Alanna Storti is a 68 y.o. female with history of liver cirrhosis secondary to [facility], hypothyroidism, hyperlipidemia, GERD, chronic anemia and thrombocytopenia recently admitted to the hospital for hepatic encephalopathy discharged on 04/03/2023 was noticed by patient's son that patient was getting confused and was complaining of pain in the right thigh area.  As per the patient's son patient had a small scratch on the right thigh is not sure if patient herself scratched on the right thigh or the cat scratch at around noontime yesterday.  By evening patient's right thigh became more erythematous and painful and patient also became confused was brought to the ER.  Per patient's son patient has been taking her medications as advised.  She was doing fine until this happened.  Denies any abdominal pain nausea vomiting or diarrhea or chest pain productive cough.   ED Course: In the ER patient was tachycardic with a temperature of 103 F.  On exam patient has a right thigh erythema.  Cultures were obtained started on empiric antibiotics.  Chest x-ray is unremarkable.  Labs show mildly elevated creatinine.  Patient was given sepsis protocol fluid bolus and started on antibiotics.  Lactic acid was elevated.  Significant Events: Admitted 04/05/2023 for right thigh cellulitis   Significant Labs: Na 136, K 3.8, CO2 of 15, BUN 18, Scr 1.12, glu 111 WBC 5.2, HgB 11.7, Plt 119 Lactic acid 3.3 04-05-2023 GNR 2/2 bottle(aerobic) for GNR  Significant Imaging  Studies: CXR Shallow inspiration. No evidence of active pulmonary disease  CT right femur Diffuse fat stranding of the subcutaneous lateral and medial right thigh, extending to the superficial fascia of the underlying anterior and posterior compartment musculature, concerning for cellulitis. These findings are similar to slightly increased  compared to the prior exam. No discrete loculated fluid collection identified. No soft tissue gas. 2. No acute osseous abnormality.  Antibiotic Therapy: Anti-infectives (From admission, onward)    Start     Dose/Rate Route Frequency Ordered Stop   04/07/23 1000  vancomycin (VANCOREADY) IVPB 1250 mg/250 mL        1,250 mg 166.7 mL/hr over 90 Minutes Intravenous Every 36 hours 04/06/23 0537     04/07/23 1000  azithromycin (ZITHROMAX) tablet 500 mg        500 mg Oral Daily 04/07/23 0719     04/06/23 2200  azithromycin (ZITHROMAX) 500 mg in sodium chloride 0.9 % 250 mL IVPB  Status:  Discontinued        500 mg 250 mL/hr over 60 Minutes Intravenous Every 24 hours 04/06/23 2033 04/07/23 0719   04/06/23 2200  ceFEPIme (MAXIPIME) 2 g in sodium chloride 0.9 % 100 mL IVPB        2 g 200 mL/hr over 30 Minutes Intravenous Every 12 hours 04/06/23 2033     04/06/23 1400  metroNIDAZOLE (FLAGYL) IVPB 500 mg  Status:  Discontinued        500 mg 100 mL/hr over 60 Minutes Intravenous Every 12 hours 04/06/23 0527 04/06/23 2032   04/06/23 1200  cefTRIAXone (ROCEPHIN) 2 g in sodium chloride 0.9 % 100 mL IVPB  Status:  Discontinued        2 g 200 mL/hr over 30 Minutes Intravenous Every 24 hours 04/06/23 0526 04/06/23 2032   04/06/23 1000  rifaximin (XIFAXAN) tablet 550 mg        550 mg Oral 2 times daily 04/06/23 0525     04/06/23 0100  ceFEPIme (MAXIPIME) 2 g in sodium chloride 0.9 % 100 mL IVPB        2 g 200 mL/hr over 30 Minutes Intravenous  Once 04/06/23 0048 04/06/23 0146   04/06/23 0100  metroNIDAZOLE (FLAGYL) IVPB 500 mg        500 mg 100 mL/hr over 60 Minutes  Intravenous  Once 04/06/23 0048 04/06/23 0317   04/06/23 0100  vancomycin (VANCOCIN) IVPB 1000 mg/200 mL premix        1,000 mg 200 mL/hr over 60 Minutes Intravenous  Once 04/06/23 0048 04/06/23 0421       Procedures:   Consultants:     Assessment and Plan: * Sepsis (HCC) 04-07-2023 present on admission with AKI. Lactic acid 3.3. right thigh cellulitis. On IV ABX with meropenem. Prior hx of ESBL pseudomonas septicemia in 09-2022  Cellulitis of right thigh 04-07-2023 On IV ABX with meropenem. Prior hx of ESBL pseudomonas septicemia in 09-2022. Not much erythema right inner medial thigh. Tender to touch.  Gram negative septicemia (HCC) 04-07-2023 blood cx growing GNR. ID consulted. On IV ABX with meropenem. Prior hx of ESBL pseudomonas septicemia in 09-2022    ARF (acute renal failure) (HCC) 04-07-2023 Scr is usually normal. Scr 1.3  RLS (restless legs syndrome) 04-07-2023 chronic   Leukoma of left eye 04-07-2023 chronic   Hypomagnesemia 04-07-2023 replete with IV Mg  DNR (do not resuscitate)/DNI(Do Not intubate) 04-07-2023 chronic   Abdominal wall hernia 04-07-2023 chronic   Obesity, Class II, BMI 35-39.9 Body mass index is 38.15 kg/m.   Short gut syndrome 04-07-2023 chronic. Has chronic diarrhea  Chronic diarrhea 04-07-2023 chronic   Hypothyroidism 04-07-2023 chronic   Thrombocytopenia (HCC) - Baseline 100-130K 04-07-2023 chronic. Due to her cirrhosis. Baseline 100-130K. Stop heparin  Cirrhosis due to NASH 04-07-2023 chronic. Hx of repeated bouts of hepatic encephalopathy. Continue with Xifaxan and lactulose.   DVT prophylaxis:   SCDs   Code Status: Limited: Do not attempt resuscitation (DNR) -DNR-LIMITED -Do Not Intubate/DNI  Family Communication: discussed with pt and son [name] at bedside Disposition Plan: return home Reason for continuing need for hospitalization: remains on IV Abx.  Objective: Vitals:   04/06/23 1947 04/06/23 2332 04/07/23  0548 04/07/23 1234  BP: (!) 103/56 (!) 101/56 (!) 102/55 (!) 120/52  Pulse: 85 78 80 76  Resp:  18 20 18   Temp: 98 F (36.7 C) 97.9 F (36.6 C) 98 F (36.7 C) 97.8 F (36.6 C)  TempSrc: Oral Oral Oral Oral  SpO2: 100% 100% 100% 100%  Weight:      Height:        Intake/Output Summary (Last 24 hours) at 04/07/2023 1349 Last data filed at 04/07/2023 1100 Gross per 24 hour  Intake 1083.21 ml  Output --  Net 1083.21 ml   Filed Weights   04/05/23 2301  Weight: 94.6 kg    Examination:  Physical Exam Vitals and nursing note reviewed.  Constitutional:      Appearance: She is obese.  HENT:     Head: Normocephalic and atraumatic.  Eyes:     Comments: Complete whiteness  of left cornea  Cardiovascular:     Rate and Rhythm: Normal rate and regular rhythm.  Pulmonary:     Effort: Pulmonary effort is normal.     Breath sounds: Normal breath sounds.  Abdominal:     General: Abdomen is protuberant. Bowel sounds are normal. There is no distension.     Palpations: Abdomen is soft.  Skin:    Capillary Refill: Capillary refill takes less than 2 seconds.     Comments: Minimal right inner thigh erythema. Mostly tender on the right upper inner thigh pannus.  Neurological:     Mental Status: She is alert and oriented to person, place, and time.     Data Reviewed: I have personally reviewed following labs and imaging studies  CBC: Recent Labs  Lab 04/02/23 0640 04/03/23 0506 04/05/23 2324 04/06/23 0632  WBC 5.7 5.0 5.7 8.7  NEUTROABS 3.0 2.7 5.1 7.5  HGB 9.2* 9.0* 11.7* 9.4*  HCT 30.7* 30.4* 37.0 30.3*  MCV 97.8 98.4 93.9 94.4  PLT 129* 105* 119* 81*   Basic Metabolic Panel: Recent Labs  Lab 04/02/23 0640 04/03/23 0506 04/05/23 2324 04/06/23 0632 04/07/23 0335  NA 138 138 136 133* 134*  K 3.6 3.7 3.8 3.3* 3.8  CL 117* 116* 112* 110 111  CO2 18* 18* 15* 16* 16*  GLUCOSE 101* 111* 111* 120* 88  BUN 15 14 18 19  24*  CREATININE 0.74 0.85 1.12* 1.23* 1.30*  CALCIUM  8.0* 7.8* 8.5* 8.1* 7.6*  MG 1.6* 1.8  --   --  1.5*  PHOS 3.9 3.8  --   --   --    GFR: Estimated Creatinine Clearance: 46.2 mL/min (A) (by C-G formula based on SCr of 1.3 mg/dL (H)). Liver Function Tests: Recent Labs  Lab 04/02/23 0640 04/03/23 0506 04/05/23 2324 04/06/23 0632 04/07/23 0335  AST 24 24 40 47* 55*  ALT 18 17 21 20 24   ALKPHOS 73 71 94 60 48  BILITOT 1.8* 1.7* 2.5* 2.1* 1.5*  PROT 4.7* 4.5* 6.3* 4.8* 4.7*  ALBUMIN 2.3* 2.1* 3.0* 2.3* 2.1*    Recent Labs  Lab 04/06/23 0116  AMMONIA 46*   Coagulation Profile: Recent Labs  Lab 04/06/23 0003  INR 1.8*   BNP (last 3 results) Recent Labs    08/30/22 0354 01/03/23 0616  BNP 614.9* 469.4*   Sepsis Labs: Recent Labs  Lab 04/06/23 0030 04/07/23 0335  LATICACIDVEN 3.3* 1.3    Recent Results (from the past 240 hours)  Culture, blood (Routine x 2)     Status: Abnormal (Preliminary result)   Collection Time: 04/05/23 11:24 PM   Specimen: BLOOD  Result Value Ref Range Status   Specimen Description   Final    BLOOD LEFT ANTECUBITAL Performed at [facility], [address], [place], [place] [address]    Special Requests   Final    BOTTLES DRAWN AEROBIC AND ANAEROBIC Blood Culture adequate volume Performed at [facility], [address], [place], [place] [address]    Culture  Setup Time   Final    GRAM NEGATIVE RODS BOTTLES DRAWN AEROBIC ONLY CRITICAL RESULT CALLED TO, READ BACK BY AND VERIFIED WITH: PHARMD NCarin Hock [id number] @1955  FH    Culture (A)  Final    FLAVOBACTERIUM ODORATUM SUSCEPTIBILITIES TO FOLLOW Performed at [facility], [address], [place], [place] [address]    Report Status PENDING  Incomplete  Blood Culture ID Panel (Reflexed)     Status:  None   Collection Time: 04/05/23 11:24 PM  Result Value Ref Range Status   Enterococcus faecalis NOT DETECTED NOT DETECTED Final   Enterococcus Faecium NOT DETECTED NOT DETECTED Final    Listeria monocytogenes NOT DETECTED NOT DETECTED Final   Staphylococcus species NOT DETECTED NOT DETECTED Final   Staphylococcus aureus (BCID) NOT DETECTED NOT DETECTED Final   Staphylococcus epidermidis NOT DETECTED NOT DETECTED Final   Staphylococcus lugdunensis NOT DETECTED NOT DETECTED Final   Streptococcus species NOT DETECTED NOT DETECTED Final   Streptococcus agalactiae NOT DETECTED NOT DETECTED Final   Streptococcus pneumoniae NOT DETECTED NOT DETECTED Final   Streptococcus pyogenes NOT DETECTED NOT DETECTED Final   A.calcoaceticus-baumannii NOT DETECTED NOT DETECTED Final   Bacteroides fragilis NOT DETECTED NOT DETECTED Final   Enterobacterales NOT DETECTED NOT DETECTED Final   Enterobacter cloacae complex NOT DETECTED NOT DETECTED Final   Escherichia coli NOT DETECTED NOT DETECTED Final   Klebsiella aerogenes NOT DETECTED NOT DETECTED Final   Klebsiella oxytoca NOT DETECTED NOT DETECTED Final   Klebsiella pneumoniae NOT DETECTED NOT DETECTED Final   Proteus species NOT DETECTED NOT DETECTED Final   Salmonella species NOT DETECTED NOT DETECTED Final   Serratia marcescens NOT DETECTED NOT DETECTED Final   Haemophilus influenzae NOT DETECTED NOT DETECTED Final   Neisseria meningitidis NOT DETECTED NOT DETECTED Final   Pseudomonas aeruginosa NOT DETECTED NOT DETECTED Final   Stenotrophomonas maltophilia NOT DETECTED NOT DETECTED Final   Candida albicans NOT DETECTED NOT DETECTED Final   Candida auris NOT DETECTED NOT DETECTED Final   Candida glabrata NOT DETECTED NOT DETECTED Final   Candida krusei NOT DETECTED NOT DETECTED Final   Candida parapsilosis NOT DETECTED NOT DETECTED Final   Candida tropicalis NOT DETECTED NOT DETECTED Final   Cryptococcus neoformans/gattii NOT DETECTED NOT DETECTED Final    Comment: Performed at [facility], [address] [place], [place] [address]  Culture, blood (Routine x 2)     Status: Abnormal (Preliminary result)   Collection Time:  04/06/23 12:03 AM   Specimen: BLOOD LEFT FOREARM  Result Value Ref Range Status   Specimen Description   Final    BLOOD LEFT FOREARM Performed at [facility], [address], [place], [place] [address]    Special Requests   Final    BOTTLES DRAWN AEROBIC AND ANAEROBIC Blood Culture results may not be optimal due to an inadequate volume of blood received in culture bottles Performed at [facility], [address], [place], [place] [address]    Culture  Setup Time   Final    GRAM NEGATIVE RODS BOTTLES DRAWN AEROBIC ONLY CRITICAL VALUE NOTED.  VALUE IS CONSISTENT WITH PREVIOUSLY REPORTED AND CALLED VALUE. Performed at [facility], [address], [place], [place] [address]    Culture FLAVOBACTERIUM ODORATUM (A)  Final   Report Status PENDING  Incomplete     Radiology Studies: CT FEMUR RIGHT WO CONTRAST Result Date: 04/06/2023 CLINICAL DATA:  Cellulitis of the right leg. EXAM: CT OF THE LOWER RIGHT EXTREMITY WITHOUT CONTRAST TECHNIQUE: Multidetector CT imaging of the right lower extremity was performed according to the standard protocol. RADIATION DOSE REDUCTION: This exam was performed according to the departmental dose-optimization program which includes automated exposure control, adjustment of the mA and/or kV according to patient size and/or use of iterative reconstruction technique. COMPARISON:  CT of the right femur dated 03/07/2022. FINDINGS: Bones/Joint/Cartilage No acute osseous abnormality identified. No evidence of acute osteolysis  or erosive changes. Moderate tricompartmental degenerative changes of the right knee are again noted manifested by joint space narrowing, marginal osteophytosis, and subchondral sclerosis. Ligaments Ligaments are suboptimally evaluated by CT. Muscles and Tendons No discrete intramuscular collection identified. Muscle bulk is unchanged. Soft tissue Diffuse infiltration and stranding of the subcutaneous fat of the  lateral and medial right thigh, extending to the superficial fascia of the underlying anterior and posterior compartment musculature. There is overlying cutaneous thickening. No discrete loculated collection identified. No soft tissue gas. No enlarged lymph nodes identified in the field of view. Vascular calcifications of the right lower extremity. Calcified uterine fibroids are again noted. IMPRESSION: 1. Diffuse fat stranding of the subcutaneous lateral and medial right thigh, extending to the superficial fascia of the underlying anterior and posterior compartment musculature, concerning for cellulitis. These findings are similar to slightly increased compared to the prior exam. No discrete loculated fluid collection identified. No soft tissue gas. 2. No acute osseous abnormality. Electronically Signed   By: Hart Robinsons M.D.   On: 04/06/2023 08:59   DG Chest 2 View Result Date: 04/06/2023 CLINICAL DATA:  Suspected sepsis.  Weakness and confusion. EXAM: CHEST - 2 VIEW COMPARISON:  03/07/2023 FINDINGS: Shallow inspiration. Heart size and pulmonary vascularity are normal for technique. Lungs are clear. No pleural effusion or pneumothorax. Mediastinal contours appear intact. Calcification of the aorta. Surgical clips in the right upper quadrant. IMPRESSION: Shallow inspiration.  No evidence of active pulmonary disease. Electronically Signed   By: Burman Nieves M.D.   On: 04/06/2023 00:04    Scheduled Meds:  atorvastatin  40 mg Oral q AM   cyanocobalamin  2,000 mcg Oral Q Fri   famotidine  20 mg Oral q AM   ferrous sulfate  325 mg Oral Q breakfast   folic acid  1 mg Oral Daily   lactulose  20 g Oral TID   levothyroxine  175 mcg Oral Q0600   midodrine  10 mg Oral TID WC   pantoprazole  40 mg Oral BID AC   rifaximin  550 mg Oral BID   rOPINIRole  0.25 mg Oral QHS   sodium bicarbonate  1,300 mg Oral BID   venlafaxine XR  75 mg Oral Q breakfast   Continuous Infusions:  meropenem (MERREM) IV 1  g (04/07/23 1026)     LOS: 1 day   Time spent: 50 minutes  Carollee Herter, DO  [facility]  04/07/2023, 1:49 PM

## 2023-04-07 NOTE — Subjective & Objective (Addendum)
 Pt seen and examined. Pt's [name] as bedside.  Scr back to normal. Has already had 2 Bms today. Pt was standing on her own this AM to wipe her anus at bedside commode.

## 2023-04-07 NOTE — Assessment & Plan Note (Signed)
 04-07-2023 chronic

## 2023-04-07 NOTE — Assessment & Plan Note (Addendum)
 04-07-2023 replete with IV Mg  04-08-2023 resolved.

## 2023-04-07 NOTE — Progress Notes (Signed)
 [facility]    Date of Admission:  04/05/2023     Reason for Consult: bacteremia    Referring Provider: Imogene Burn     Lines:  Peripheral iv's  Abx: 3/28-c meropenem       Azith cefepime Vanc  Assessment: 68 year old female with history of decompensated alcoholic cirrhosis, prior several episodes gnr bacteremia (kleb pna 12/2022, PsA 09/2022, flavibacterium odoratum 08/2022 and 04/2022), readmitted 04/05/23 for 1 day rapidly worsening rle cellulitis found to have gnr bacteremia as well  Bcid gnr panel couldn't identify. Prior flavibacterium only sensitive to imipenem  Patient started on cefepime and had been improving (mentation/right leg pain)  ?patient scratching the leg and introduced flavibacterium into the blood stream. Streptococcal spectrum cellulitis on physical examination.  Exam showed no abscess or sign of necrotizing fasciitis on rle  No abdominal tenderness although cr up from likely sepsis but will need monitoring to r/o hrs type 2; choice of current abx should cover for any sbp (obesity confounds fluid wave exam but prior 09/2022 ct abd pelv doesn't indicate ascites).    She takes rifaximin for PHE  Plan: Change empiric abx to meropenem F/u blood cx sensitivity Plan 7 day for bsi treatment, but cellulitis might need a little longer duration although can be changed to cefadroxil after the 7 day for gram negative treatment Aki management per primary team Chest xray is clear and no sign of pna, I'll go ahead and stop azithromycin     ------------------------------------------------ Principal Problem:   Sepsis (HCC) Active Problems:   Cirrhosis due to NASH   Thrombocytopenia (HCC)   Hypothyroidism   Abdominal wall hernia   DNR (do not resuscitate)/DNI(Do Not intubate)   Corneal perforation of left eye   ARF (acute renal failure) (HCC)   RLS (restless legs syndrome)   Cellulitis    HPI: Kiyomi Pallo is a 68 y.o.  femalewith history of decompensated alcoholic cirrhosis, prior several episodes gnr bacteremia (kleb pna 12/2022, PsA 09/2022, flavibacterium odoratum 08/2022 and 04/2022), readmitted 04/05/23 for 1 day rapidly worsening rle cellulitis found to have gnr bacteremia as well   Son by bedside Patient's mentation better today.  Both corroborate story   Patient at baseline health until the day prior to admission when patient was complaining of right thigh pain and was scratching the area. There was no obvious sore/skin break. Within several hours spreading redness and pain develops along with ams, so patient brought in  Febrile on presentation No leukocytosis; cr up from baseline Cxr clear Ct femur suggest edema of soft tissue Bcx with gnr not identified on bcid Started on zith/cefepime/vanc/flagyl  Improving mentation; fever resolved  No uti sx and no pna sx per patient's report No other focal pain outside of rle, and reports better pain/redness today      Family History  Problem Relation Age of Onset   Uterine cancer Mother    Bone cancer Father    Heart disease Father    Hyperlipidemia Father    Dementia Father    Hypertension Father    Skin cancer Sister    Hypertension Sister    Diabetes Sister    Hyperlipidemia Brother    Hypertension Brother    Heart disease Brother     Social History   Tobacco Use   Smoking status: Former    Current packs/day: 0.00    Types: Cigarettes    Quit date: 1987    Years since quitting:  38.2    Passive exposure: Past   Smokeless tobacco: Never  Vaping Use   Vaping status: Never Used  Substance Use Topics   Alcohol use: No   Drug use: No    Allergies  Allergen Reactions   Ace Inhibitors Anaphylaxis and Swelling   Ivp Dye [Iodinated Contrast Media] Anaphylaxis   Desitin [Zinc Oxide] Rash and Other (See Comments)    Worsens rash   Gadolinium Derivatives Other (See Comments)    Reaction??   Hydroxyzine Other (See Comments)     Affects the mind   Chlorhexidine Rash and Other (See Comments)    WORSENS RASHES   Doxycycline Rash   Penicillins Rash   Zofran [Ondansetron] Rash    Review of Systems: ROS All Other ROS was negative, except mentioned above   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   Obesity    Pulmonary emboli (HCC) 01/10/2009   Pulmonary embolism (HCC)    Short gut syndrome    Thyroid disease        Scheduled Meds:  atorvastatin  40 mg Oral q AM   azithromycin  500 mg Oral Daily   cyanocobalamin  2,000 mcg Oral Q Fri   famotidine  20 mg Oral q AM   ferrous sulfate  325 mg Oral Q breakfast   folic acid  1 mg Oral Daily   heparin  5,000 Units Subcutaneous Q8H   lactulose  20 g Oral TID   levothyroxine  175 mcg Oral Q0600   midodrine  10 mg Oral TID WC   pantoprazole  40 mg Oral BID AC   rifaximin  550 mg Oral BID   rOPINIRole  0.25 mg Oral QHS   sodium bicarbonate  1,300 mg Oral BID   venlafaxine XR  75 mg Oral Q breakfast   Continuous Infusions:  meropenem (MERREM) IV 1 g (04/07/23 1026)   PRN Meds:.acetaminophen, morphine injection, mouth rinse   OBJECTIVE: Blood pressure (!) 102/55, pulse 80, temperature 98 F (36.7 C), temperature source Oral, resp. rate 20, height 5\' 2"  (1.575 m), weight 94.6 kg, SpO2 100%.  Physical Exam  General/constitutional: no distress, pleasant HEENT: Normocephalic, PER, Conj Clear, EOMI, Oropharynx clear Neck supple CV: rrr no mrg Lungs: clear to auscultation, normal respiratory  effort Abd: Soft, Nontender Ext/msk/ext: bilateral le edema symmetric; tender rle thigh/distal lower ext; slight hue of erythema in the inner thigh improving compared to picture on admission Neuro: nonfocal    Lab Results Lab Results  Component Value Date   WBC 8.7 04/06/2023   HGB 9.4 (L) 04/06/2023   HCT 30.3 (L) 04/06/2023   MCV 94.4 04/06/2023   PLT 81 (L) 04/06/2023    Lab Results  Component Value Date   CREATININE 1.30 (H) 04/07/2023   BUN 24 (H) 04/07/2023   NA 134 (L) 04/07/2023   K 3.8 04/07/2023   CL 111 04/07/2023   CO2 16 (L) 04/07/2023    Lab Results  Component Value  Date   ALT 24 04/07/2023   AST 55 (H) 04/07/2023   ALKPHOS 48 04/07/2023   BILITOT 1.5 (H) 04/07/2023      Microbiology: Recent Results (from the past 240 hours)  Culture, blood (Routine x 2)     Status: None (Preliminary result)   Collection Time: 04/05/23 11:24 PM   Specimen: BLOOD  Result Value Ref Range Status   Specimen Description   Final    BLOOD LEFT ANTECUBITAL Performed at [facility], [address] [place], [place] [address]    Special Requests   Final    BOTTLES DRAWN AEROBIC AND ANAEROBIC Blood Culture adequate volume Performed at [facility], [address], [place], [place] [address]    Culture  Setup Time   Final    GRAM NEGATIVE RODS BOTTLES DRAWN AEROBIC ONLY CRITICAL RESULT CALLED TO, READ BACK BY AND VERIFIED WITH: PHARMD Haze Boyden [id number] @1955  FH    Culture   Final    GRAM NEGATIVE RODS IDENTIFICATION AND SUSCEPTIBILITIES TO FOLLOW Performed at [facility], [address], [place], [place] [address]    Report Status PENDING  Incomplete  Blood Culture ID Panel (Reflexed)     Status: None   Collection Time: 04/05/23 11:24 PM  Result Value Ref Range Status   Enterococcus faecalis NOT DETECTED NOT DETECTED Final   Enterococcus Faecium NOT DETECTED NOT DETECTED Final   Listeria monocytogenes NOT DETECTED NOT  DETECTED Final   Staphylococcus species NOT DETECTED NOT DETECTED Final   Staphylococcus aureus (BCID) NOT DETECTED NOT DETECTED Final   Staphylococcus epidermidis NOT DETECTED NOT DETECTED Final   Staphylococcus lugdunensis NOT DETECTED NOT DETECTED Final   Streptococcus species NOT DETECTED NOT DETECTED Final   Streptococcus agalactiae NOT DETECTED NOT DETECTED Final   Streptococcus pneumoniae NOT DETECTED NOT DETECTED Final   Streptococcus pyogenes NOT DETECTED NOT DETECTED Final   A.calcoaceticus-baumannii NOT DETECTED NOT DETECTED Final   Bacteroides fragilis NOT DETECTED NOT DETECTED Final   Enterobacterales NOT DETECTED NOT DETECTED Final   Enterobacter cloacae complex NOT DETECTED NOT DETECTED Final   Escherichia coli NOT DETECTED NOT DETECTED Final   Klebsiella aerogenes NOT DETECTED NOT DETECTED Final   Klebsiella oxytoca NOT DETECTED NOT DETECTED Final   Klebsiella pneumoniae NOT DETECTED NOT DETECTED Final   Proteus species NOT DETECTED NOT DETECTED Final   Salmonella species NOT DETECTED NOT DETECTED Final   Serratia marcescens NOT DETECTED NOT DETECTED Final   Haemophilus influenzae NOT DETECTED NOT DETECTED Final   Neisseria meningitidis NOT DETECTED NOT DETECTED Final   Pseudomonas aeruginosa NOT DETECTED NOT DETECTED Final   Stenotrophomonas maltophilia NOT DETECTED NOT DETECTED Final   Candida albicans NOT DETECTED NOT DETECTED Final   Candida auris NOT DETECTED NOT DETECTED Final   Candida glabrata NOT DETECTED NOT DETECTED Final   Candida krusei NOT DETECTED NOT DETECTED Final   Candida parapsilosis NOT DETECTED NOT DETECTED Final   Candida tropicalis NOT DETECTED NOT DETECTED Final   Cryptococcus neoformans/gattii NOT DETECTED NOT DETECTED Final    Comment: Performed at [facility], [name] [place], [place] [address]  Culture, blood (Routine x 2)     Status: None (Preliminary result)   Collection Time: 04/06/23 12:03 AM   Specimen: BLOOD LEFT  FOREARM  Result Value Ref Range Status   Specimen Description   Final    BLOOD LEFT FOREARM Performed at [facility], [address] [place], [place]  [address]    Special Requests   Final    BOTTLES DRAWN AEROBIC AND ANAEROBIC Blood Culture results may not be optimal due to an inadequate volume of blood received in culture bottles Performed at [facility], [address] [place], [place] [address]    Culture  Setup Time   Final    GRAM NEGATIVE RODS BOTTLES DRAWN AEROBIC ONLY CRITICAL VALUE NOTED.  VALUE IS CONSISTENT WITH PREVIOUSLY REPORTED AND CALLED VALUE.    Culture   Final    GRAM NEGATIVE RODS IDENTIFICATION TO FOLLOW Performed at [facility], [address] [place], [place] [address]    Report Status PENDING  Incomplete     Serology:    Imaging: If present, new imagings (plain films, ct scans, and mri) have been personally visualized and interpreted; radiology reports have been reviewed. Decision making incorporated into the Impression / Recommendations.  3/27 cxr Shallow inspiration. No evidence of active pulmonary disease.   3/27 ct femur rle 1. Diffuse fat stranding of the subcutaneous lateral and medial right thigh, extending to the superficial fascia of the underlying anterior and posterior compartment musculature, concerning for cellulitis. These findings are similar to slightly increased compared to the prior exam. No discrete loculated fluid collection identified. No soft tissue gas. 2. No acute osseous abnormality.  Raymondo Band, MD [facility] [phone] pager    04/07/2023, 10:43 AM

## 2023-04-07 NOTE — Progress Notes (Signed)
 Mobility Specialist - Progress Note    04/07/23 1057  Mobility  Activity Transferred to/from [facility];Ambulated with assistance in hallway  Level of Assistance Contact guard assist, steadying assist  Assistive Device Front wheel walker  Distance Ambulated (ft) 50 ft  Range of Motion/Exercises Active  Activity Response Tolerated well  Mobility Referral Yes  Mobility visit 1 Mobility  Mobility Specialist Start Time (ACUTE ONLY) 1040  Mobility Specialist Stop Time (ACUTE ONLY) 1057  Mobility Specialist Time Calculation (min) (ACUTE ONLY) 17 min   Pt was found in bed and agreeable to ambulate. C/o pain on R leg. At EOS returned to recliner chair with all needs met. Call bell in reach.  Billey Chang Mobility Specialist

## 2023-04-07 NOTE — Assessment & Plan Note (Signed)
 04-07-2023 present on admission with AKI. Lactic acid 3.3. right thigh cellulitis. On IV ABX with meropenem. Prior hx of ESBL pseudomonas septicemia in 09-2022

## 2023-04-07 NOTE — Plan of Care (Signed)

## 2023-04-07 NOTE — Assessment & Plan Note (Addendum)
 04-07-2023 On IV ABX with meropenem. Prior hx of ESBL pseudomonas septicemia in 09-2022. Not much erythema right inner medial thigh. Tender to touch.  04-08-2023 blood cx growing FLAVOBACTERIUM ODORATUM. On IV meropenem due to resistance to cipro/zosyn. Will wait for ID to return on Monday to decide on length of treatment course. Right inner thigh without much erythema. Pt is developing some weeping edema. Will need to restart lasix.  04-09-2023 lasix restarted yesterday. Restart aldactone today. Scr back to normal. Still with some weeping on right outer thigh. Right inner medial thigh is much less indurated. Minimal erythema.  04-10-2023 only slightly tender in most distant portion of her right inner thigh pannus. Nearly all the erythema in right thigh pannus has resolved.  04-11-2023 improving tenderness right distal medial inner thigh pannus. On IV meropenem. Last day of IV meropenem is 04-14-2023. DC to home with 7 days of duricef 1000 mg bid.

## 2023-04-07 NOTE — Assessment & Plan Note (Addendum)
 04-07-2023 Scr is usually normal. Scr 1.3  04-08-2023 will need to restart po lasix due to weeping edema of her thighs  04-09-2023 Scr back to normal 0.91 today. Back on lasix. Restart aldcatone due to edema of thighs.  04-10-2023  AKI/ARF has resolved. Scr 0.83 after restarting lasix and aldactone yesterday.

## 2023-04-07 NOTE — Assessment & Plan Note (Addendum)
 04-07-2023 chronic. Has chronic diarrhea  04-08-2023 chronic. stable

## 2023-04-07 NOTE — Assessment & Plan Note (Signed)
Body mass index is 38.15 kg/m.

## 2023-04-07 NOTE — Assessment & Plan Note (Deleted)
 04-07-2023 chronic

## 2023-04-07 NOTE — Hospital Course (Addendum)
 68 y.o. female with history of liver cirrhosis secondary to [facility], hypothyroidism, hyperlipidemia, GERD, chronic anemia and thrombocytopenia recently admitted to the hospital for hepatic encephalopathy discharged on 04/03/2023 was noticed by patient's son that patient was getting confused and was complaining of pain in the right thigh area , she had a small scratch on the right thigh and her thigh became more erythematous and painful and patient also became confused was brought to the ER and found to be tachycardic febrile and admitted for right thigh cellulitis. Patient was treated with IV antibiotics, she had flava bacterium bacteremia-ID was consulted, received meropenem through 4/4 and transition to cefadroxil to complete 7 more days.  Alert awake and oriented no asterixis and remains stable. ?  Lump on her right breast similar on the left with mild erythema small rash does have fluid overload state will increase diuretics x 7 days, advised to follow-up with PCP next week if does not resolve will need further workup-patient at the bedside instructed.  Significant procedures/testing: CXR Shallow inspiration. No evidence of active pulmonary disease  CT right femur Diffuse fat stranding of the subcutaneous lateral and medial right thigh, extending to the superficial fascia of the underlying anterior and posterior compartment musculature, concerning for cellulitis. These findings are similar to slightly increased  compared to the prior exam. No discrete loculated fluid collection identified. No soft tissue gas. 2. No acute osseous abnormality.  Subjective: Seen and examined this morning Alert awake oriented.  Eager to go home this morning Overnight afebrile BP stable, overall mental status stable   Discharge diagnosis:  Primary problem: sepsis POA RLE cellulitis Flavobacterium bacteremia: Patient presented with sepsis in the setting of right thigh cellulitis, and bacteremia with her immunocompromise  status from liver cirrhosis. Seen by ID-completed IV meropenem 04/14/23,  now on cefadroxil  cont x 7 more days for cellulitis. Continue contact precaution. ID signed off  Liver cirrhosis due to NASH Repeated episodes of acute encephalopathy Coagulopathy with elevated INR from cirrhosis Fluid overload with thigh swelling/breast swelling-more on dependent part: Continue her rifaximin, lactulose.  Monitor mental status asterixis- none currently Continue her Lasix, Aldactone, PPI, sodium bicarb Pepcid B12 supplement Diuretics increased for a week with instruction to follow-up with PCP Recent Labs  Lab 04/07/23 0335 04/08/23 0431 04/09/23 0347 04/10/23 0327 04/12/23 0406  AST 55* 49* 43*  --   --   ALT 24 26 28   --   --   ALKPHOS 48 61 59  --   --   BILITOT 1.5* 1.4* 1.3*  --   --   PROT 4.7* 4.5* 4.6*  --   --   ALBUMIN 2.1* 2.0* 2.1*  --   --   PLT  --  109*  --  108* 125*    Chronic thrombocytopenia due to liver cirrhosis: Baseline 100-130. Monitor labs Recent Labs  Lab 04/08/23 0431 04/10/23 0327 04/12/23 0406  PLT 109* 108* 125*    AKI: Resolved. Cont to monitor  RLS: Chronic and stable.  Left eye leukoma: Supportive care.  Hypomagnesemia: Improved. Monitor.  Abdominal wall hernia: Chronic.stable  Short gut syndrome Chronic diarrhea: Continue supportive care.  Lactulose.  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  Deconditioning/debility: Patient has complex medical comorbidities, she has a recurrent admission and she remains at risk for recurrent admissions. She will benefit with  outpatient Perative care follow-up.  She is DNR

## 2023-04-07 NOTE — Assessment & Plan Note (Addendum)
 04-07-2023 chronic. Hx of repeated bouts of hepatic encephalopathy. Continue with Xifaxan and lactulose.  04-08-2023 stable. Continue xifaxan and lactulose.  04-09-2023 stable. Continue xifaxan and lactulose.  04-10-2023 stable  04-11-2023 chronic. stable. Continue xifaxan and lactulose.

## 2023-04-07 NOTE — Assessment & Plan Note (Addendum)
 04-07-2023 chronic. Due to her cirrhosis. Baseline 100-130K. Stop heparin  04-09-2023 repeat CBC in AM to check plt. Last dose of SQ heparin was 04-07-2023 @ 1335  04-10-2023 stable. Plt count 108K today.

## 2023-04-08 DIAGNOSIS — K90829 Short bowel syndrome, unspecified: Secondary | ICD-10-CM

## 2023-04-08 DIAGNOSIS — N179 Acute kidney failure, unspecified: Secondary | ICD-10-CM | POA: Diagnosis not present

## 2023-04-08 DIAGNOSIS — A419 Sepsis, unspecified organism: Secondary | ICD-10-CM | POA: Diagnosis not present

## 2023-04-08 DIAGNOSIS — A4150 Gram-negative sepsis, unspecified: Secondary | ICD-10-CM | POA: Diagnosis not present

## 2023-04-08 DIAGNOSIS — L03115 Cellulitis of right lower limb: Secondary | ICD-10-CM | POA: Diagnosis not present

## 2023-04-08 LAB — CULTURE, BLOOD (ROUTINE X 2): Special Requests: ADEQUATE

## 2023-04-08 LAB — CBC WITH DIFFERENTIAL/PLATELET
Abs Immature Granulocytes: 0.03 10*3/uL (ref 0.00–0.07)
Basophils Absolute: 0 10*3/uL (ref 0.0–0.1)
Basophils Relative: 0 %
Eosinophils Absolute: 0.7 10*3/uL — ABNORMAL HIGH (ref 0.0–0.5)
Eosinophils Relative: 6 %
HCT: 28.7 % — ABNORMAL LOW (ref 36.0–46.0)
Hemoglobin: 8.5 g/dL — ABNORMAL LOW (ref 12.0–15.0)
Immature Granulocytes: 0 %
Lymphocytes Relative: 13 %
Lymphs Abs: 1.4 10*3/uL (ref 0.7–4.0)
MCH: 29 pg (ref 26.0–34.0)
MCHC: 29.6 g/dL — ABNORMAL LOW (ref 30.0–36.0)
MCV: 98 fL (ref 80.0–100.0)
Monocytes Absolute: 0.8 10*3/uL (ref 0.1–1.0)
Monocytes Relative: 8 %
Neutro Abs: 7.6 10*3/uL (ref 1.7–7.7)
Neutrophils Relative %: 73 %
Platelets: 109 10*3/uL — ABNORMAL LOW (ref 150–400)
RBC: 2.93 MIL/uL — ABNORMAL LOW (ref 3.87–5.11)
RDW: 17.2 % — ABNORMAL HIGH (ref 11.5–15.5)
WBC: 10.5 10*3/uL (ref 4.0–10.5)
nRBC: 0 % (ref 0.0–0.2)

## 2023-04-08 LAB — COMPREHENSIVE METABOLIC PANEL WITH GFR
ALT: 26 U/L (ref 0–44)
AST: 49 U/L — ABNORMAL HIGH (ref 15–41)
Albumin: 2 g/dL — ABNORMAL LOW (ref 3.5–5.0)
Alkaline Phosphatase: 61 U/L (ref 38–126)
Anion gap: 6 (ref 5–15)
BUN: 27 mg/dL — ABNORMAL HIGH (ref 8–23)
CO2: 17 mmol/L — ABNORMAL LOW (ref 22–32)
Calcium: 7.7 mg/dL — ABNORMAL LOW (ref 8.9–10.3)
Chloride: 113 mmol/L — ABNORMAL HIGH (ref 98–111)
Creatinine, Ser: 1.28 mg/dL — ABNORMAL HIGH (ref 0.44–1.00)
GFR, Estimated: 46 mL/min — ABNORMAL LOW (ref >60)
Glucose, Bld: 134 mg/dL — ABNORMAL HIGH (ref 70–99)
Potassium: 3.4 mmol/L — ABNORMAL LOW (ref 3.5–5.1)
Sodium: 136 mmol/L (ref 135–145)
Total Bilirubin: 1.4 mg/dL — ABNORMAL HIGH (ref 0.0–1.2)
Total Protein: 4.5 g/dL — ABNORMAL LOW (ref 6.5–8.1)

## 2023-04-08 LAB — MAGNESIUM: Magnesium: 2 mg/dL (ref 1.7–2.4)

## 2023-04-08 MED ORDER — MORPHINE SULFATE (PF) 2 MG/ML IV SOLN
1.0000 mg | Freq: Once | INTRAVENOUS | Status: AC
  Administered 2023-04-08: 1 mg via INTRAVENOUS
  Filled 2023-04-08: qty 1

## 2023-04-08 MED ORDER — POTASSIUM CHLORIDE CRYS ER 20 MEQ PO TBCR
40.0000 meq | EXTENDED_RELEASE_TABLET | ORAL | Status: AC
  Administered 2023-04-08 (×2): 40 meq via ORAL
  Filled 2023-04-08 (×2): qty 2

## 2023-04-08 MED ORDER — FUROSEMIDE 40 MG PO TABS
40.0000 mg | ORAL_TABLET | Freq: Every day | ORAL | Status: DC
  Administered 2023-04-08 – 2023-04-14 (×7): 40 mg via ORAL
  Filled 2023-04-08 (×7): qty 1

## 2023-04-08 NOTE — Progress Notes (Signed)
 PROGRESS NOTE    Sanya Kobrin  ZOX:[id number] DOB: [birth year] DOA: 04/05/2023 PCP: [name], MD  Subjective: Pt seen and examined. Pt's [name] as bedside.  Pt developing weeping edema of right outer thigh. Right inner thigh without much erythema. Still tender to touch.  Pt is axox4. Walking well with PT.   Hospital Course: HPI:  Ajwa Kimberley is a 68 y.o. female with history of liver cirrhosis secondary to [facility], hypothyroidism, hyperlipidemia, GERD, chronic anemia and thrombocytopenia recently admitted to the hospital for hepatic encephalopathy discharged on 04/03/2023 was noticed by patient's son that patient was getting confused and was complaining of pain in the right thigh area.  As per the patient's son patient had a small scratch on the right thigh is not sure if patient herself scratched on the right thigh or the cat scratch at around noontime yesterday.  By evening patient's right thigh became more erythematous and painful and patient also became confused was brought to the ER.  Per patient's son patient has been taking her medications as advised.  She was doing fine until this happened.  Denies any abdominal pain nausea vomiting or diarrhea or chest pain productive cough.   ED Course: In the ER patient was tachycardic with a temperature of 103 F.  On exam patient has a right thigh erythema.  Cultures were obtained started on empiric antibiotics.  Chest x-ray is unremarkable.  Labs show mildly elevated creatinine.  Patient was given sepsis protocol fluid bolus and started on antibiotics.  Lactic acid was elevated.  Significant Events: Admitted 04/05/2023 for right thigh cellulitis   Significant Labs: Na 136, K 3.8, CO2 of 15, BUN 18, Scr 1.12, glu 111 WBC 5.2, HgB 11.7, Plt 119 Lactic acid 3.3 04-05-2023 GNR 2/2 bottle(aerobic) for GNR  Significant Imaging Studies: CXR Shallow inspiration. No evidence of active pulmonary disease  CT right femur Diffuse fat stranding  of the subcutaneous lateral and medial right thigh, extending to the superficial fascia of the underlying anterior and posterior compartment musculature, concerning for cellulitis. These findings are similar to slightly increased  compared to the prior exam. No discrete loculated fluid collection identified. No soft tissue gas. 2. No acute osseous abnormality.  Antibiotic Therapy: Anti-infectives (From admission, onward)    Start     Dose/Rate Route Frequency Ordered Stop   04/07/23 1000  vancomycin (VANCOREADY) IVPB 1250 mg/250 mL        1,250 mg 166.7 mL/hr over 90 Minutes Intravenous Every 36 hours 04/06/23 0537     04/07/23 1000  azithromycin (ZITHROMAX) tablet 500 mg        500 mg Oral Daily 04/07/23 0719     04/06/23 2200  azithromycin (ZITHROMAX) 500 mg in sodium chloride 0.9 % 250 mL IVPB  Status:  Discontinued        500 mg 250 mL/hr over 60 Minutes Intravenous Every 24 hours 04/06/23 2033 04/07/23 0719   04/06/23 2200  ceFEPIme (MAXIPIME) 2 g in sodium chloride 0.9 % 100 mL IVPB        2 g 200 mL/hr over 30 Minutes Intravenous Every 12 hours 04/06/23 2033     04/06/23 1400  metroNIDAZOLE (FLAGYL) IVPB 500 mg  Status:  Discontinued        500 mg 100 mL/hr over 60 Minutes Intravenous Every 12 hours 04/06/23 0527 04/06/23 2032   04/06/23 1200  cefTRIAXone (ROCEPHIN) 2 g in sodium chloride 0.9 % 100 mL IVPB  Status:  Discontinued  2 g 200 mL/hr over 30 Minutes Intravenous Every 24 hours 04/06/23 0526 04/06/23 2032   04/06/23 1000  rifaximin (XIFAXAN) tablet 550 mg        550 mg Oral 2 times daily 04/06/23 0525     04/06/23 0100  ceFEPIme (MAXIPIME) 2 g in sodium chloride 0.9 % 100 mL IVPB        2 g 200 mL/hr over 30 Minutes Intravenous  Once 04/06/23 0048 04/06/23 0146   04/06/23 0100  metroNIDAZOLE (FLAGYL) IVPB 500 mg        500 mg 100 mL/hr over 60 Minutes Intravenous  Once 04/06/23 0048 04/06/23 0317   04/06/23 0100  vancomycin (VANCOCIN) IVPB 1000 mg/200 mL premix         1,000 mg 200 mL/hr over 60 Minutes Intravenous  Once 04/06/23 0048 04/06/23 0421       Procedures:   Consultants:     Assessment and Plan: * Sepsis (HCC) 04-07-2023 present on admission with AKI. Lactic acid 3.3. right thigh cellulitis. On IV ABX with meropenem. Prior hx of ESBL pseudomonas septicemia in 09-2022  Cellulitis of right thigh 04-07-2023 On IV ABX with meropenem. Prior hx of ESBL pseudomonas septicemia in 09-2022. Not much erythema right inner medial thigh. Tender to touch.  04-08-2023 blood cx growing FLAVOBACTERIUM ODORATUM. On IV meropenem due to resistance to cipro/zosyn. Will wait for ID to return on Monday to decide on length of treatment course. Right inner thigh without much erythema. Pt is developing some weeping edema. Will need to restart lasix.  Gram negative septicemia (HCC) 04-07-2023 blood cx growing GNR. ID consulted. On IV ABX with meropenem. Prior hx of ESBL pseudomonas septicemia in 09-2022  04-08-2023 blood cx growing FLAVOBACTERIUM ODORATUM. On IV meropenem due to resistance to cipro/zosyn. Will wait for ID to return on Monday to decide on length of treatment course.  ARF (acute renal failure) (HCC) 04-07-2023 Scr is usually normal. Scr 1.3  04-08-2023 will need to restart po lasix due to weeping edema of her thighs  RLS (restless legs syndrome) 04-07-2023 chronic   04-08-2023 chronic. 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 stable  Hypomagnesemia 04-07-2023 replete with IV Mg  04-08-2023 resolved.  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 stable  Obesity, Class II, BMI 35-39.9 Body mass index is 38.15 kg/m.   Short gut syndrome 04-07-2023 chronic. Has chronic diarrhea  04-08-2023 chronic. stable  Chronic diarrhea 04-07-2023 chronic   04-08-2023 chronic. stable  Hypothyroidism 04-07-2023 chronic    04-08-2023 chronic. stable  Thrombocytopenia (HCC) - Baseline 100-130K 04-07-2023 chronic. Due to her cirrhosis. Baseline 100-130K. Stop heparin  Cirrhosis due to NASH 04-07-2023 chronic. Hx of repeated bouts of hepatic encephalopathy. Continue with Xifaxan and lactulose.  04-08-2023 stable. Continue xifaxan and lactulose.  DVT prophylaxis:   SCDs   Code Status: Limited: Do not attempt resuscitation (DNR) -DNR-LIMITED -Do Not Intubate/DNI  Family Communication: discussed with pt and son [name] at bedside Disposition Plan: return home Reason for continuing need for hospitalization: remains on IV Abx.  Objective: Vitals:   04/07/23 0548 04/07/23 1234 04/07/23 1943 04/08/23 0500  BP: (!) 102/55 (!) 120/52 (!) 114/53 134/61  Pulse: 80 76 70 64  Resp: 20 18 20 16   Temp: 98 F (36.7 C) 97.8 F (36.6 C) 98 F (36.7 C) 98.4 F (36.9 C)  TempSrc: Oral Oral Oral Oral  SpO2: 100% 100% 100%  100%  Weight:      Height:        Intake/Output Summary (Last 24 hours) at 04/08/2023 1336 Last data filed at 04/07/2023 1820 Gross per 24 hour  Intake 240 ml  Output --  Net 240 ml   Filed Weights   04/05/23 2301  Weight: 94.6 kg    Examination:  Physical Exam Vitals and nursing note reviewed.  Constitutional:      General: She is not in acute distress.    Appearance: She is obese. She is not toxic-appearing.  HENT:     Head: Normocephalic and atraumatic.     Nose: Nose normal.  Eyes:     Comments: Left corneal leukoma  Cardiovascular:     Rate and Rhythm: Normal rate and regular rhythm.  Pulmonary:     Effort: Pulmonary effort is normal.     Breath sounds: Normal breath sounds.  Abdominal:     General: Abdomen is protuberant. Bowel sounds are normal. There is no distension.     Palpations: Abdomen is soft.  Musculoskeletal:     Comments: + 2 pitting thigh edema. Small area of weeping, clear, serous drainage on right outer thigh.  Right inner medial thigh tender to touch  but minimal erythema.  Skin:    General: Skin is warm and dry.     Capillary Refill: Capillary refill takes less than 2 seconds.  Neurological:     Mental Status: She is oriented to person, place, and time.    Data Reviewed: I have personally reviewed following labs and imaging studies  CBC: Recent Labs  Lab 04/02/23 0640 04/03/23 0506 04/05/23 2324 04/06/23 0632 04/08/23 0431  WBC 5.7 5.0 5.7 8.7 10.5  NEUTROABS 3.0 2.7 5.1 7.5 7.6  HGB 9.2* 9.0* 11.7* 9.4* 8.5*  HCT 30.7* 30.4* 37.0 30.3* 28.7*  MCV 97.8 98.4 93.9 94.4 98.0  PLT 129* 105* 119* 81* 109*   Basic Metabolic Panel: Recent Labs  Lab 04/02/23 0640 04/03/23 0506 04/05/23 2324 04/06/23 0632 04/07/23 0335 04/08/23 0431  NA 138 138 136 133* 134* 136  K 3.6 3.7 3.8 3.3* 3.8 3.4*  CL 117* 116* 112* 110 111 113*  CO2 18* 18* 15* 16* 16* 17*  GLUCOSE 101* 111* 111* 120* 88 134*  BUN 15 14 18 19  24* 27*  CREATININE 0.74 0.85 1.12* 1.23* 1.30* 1.28*  CALCIUM 8.0* 7.8* 8.5* 8.1* 7.6* 7.7*  MG 1.6* 1.8  --   --  1.5* 2.0  PHOS 3.9 3.8  --   --   --   --    GFR: Estimated Creatinine Clearance: 47 mL/min (A) (by C-G formula based on SCr of 1.28 mg/dL (H)). Liver Function Tests: Recent Labs  Lab 04/03/23 0506 04/05/23 2324 04/06/23 0632 04/07/23 0335 04/08/23 0431  AST 24 40 47* 55* 49*  ALT 17 21 20 24 26   ALKPHOS 71 94 60 48 61  BILITOT 1.7* 2.5* 2.1* 1.5* 1.4*  PROT 4.5* 6.3* 4.8* 4.7* 4.5*  ALBUMIN 2.1* 3.0* 2.3* 2.1* 2.0*    Recent Labs  Lab 04/06/23 0116  AMMONIA 46*   Coagulation Profile: Recent Labs  Lab 04/06/23 0003  INR 1.8*   BNP (last 3 results) Recent Labs    08/30/22 0354 01/03/23 0616  BNP 614.9* 469.4*   Sepsis Labs: Recent Labs  Lab 04/06/23 0030 04/07/23 0335  LATICACIDVEN 3.3* 1.3    Recent Results (from the past 240 hours)  Culture, blood (Routine x 2)     Status:  Abnormal   Collection Time: 04/05/23 11:24 PM   Specimen: BLOOD  Result Value Ref Range Status    Specimen Description   Final    BLOOD LEFT ANTECUBITAL Performed at [facility], [address] [place], [place] [address]    Special Requests   Final    BOTTLES DRAWN AEROBIC AND ANAEROBIC Blood Culture adequate volume Performed at [facility], [address] [place], [place] [address]    Culture  Setup Time   Final    GRAM NEGATIVE RODS BOTTLES DRAWN AEROBIC ONLY CRITICAL RESULT CALLED TO, READ BACK BY AND VERIFIED WITH: Abe People [id number] @1955  FH Performed at [facility], [address], [place], [place] [address]    Culture FLAVOBACTERIUM ODORATUM (A)  Final   Report Status 04/08/2023 FINAL  Final   Organism ID, Bacteria FLAVOBACTERIUM ODORATUM  Final      Susceptibility   Flavobacterium odoratum - MIC*    CEFTAZIDIME >=64 RESISTANT Resistant     CIPROFLOXACIN >=4 RESISTANT Resistant     GENTAMICIN >=16 RESISTANT Resistant     IMIPENEM 2 SENSITIVE Sensitive     TRIMETH/SULFA >=320 RESISTANT Resistant     PIP/TAZO >=128 RESISTANT Resistant ug/mL    * FLAVOBACTERIUM ODORATUM  Blood Culture ID Panel (Reflexed)     Status: None   Collection Time: 04/05/23 11:24 PM  Result Value Ref Range Status   Enterococcus faecalis NOT DETECTED NOT DETECTED Final   Enterococcus Faecium NOT DETECTED NOT DETECTED Final   Listeria monocytogenes NOT DETECTED NOT DETECTED Final   Staphylococcus species NOT DETECTED NOT DETECTED Final   Staphylococcus aureus (BCID) NOT DETECTED NOT DETECTED Final   Staphylococcus epidermidis NOT DETECTED NOT DETECTED Final   Staphylococcus lugdunensis NOT DETECTED NOT DETECTED Final   Streptococcus species NOT DETECTED NOT DETECTED Final   Streptococcus agalactiae NOT DETECTED NOT DETECTED Final   Streptococcus pneumoniae NOT DETECTED NOT DETECTED Final   Streptococcus pyogenes NOT DETECTED NOT DETECTED Final   A.calcoaceticus-baumannii NOT DETECTED NOT DETECTED Final   Bacteroides fragilis NOT DETECTED  NOT DETECTED Final   Enterobacterales NOT DETECTED NOT DETECTED Final   Enterobacter cloacae complex NOT DETECTED NOT DETECTED Final   Escherichia coli NOT DETECTED NOT DETECTED Final   Klebsiella aerogenes NOT DETECTED NOT DETECTED Final   Klebsiella oxytoca NOT DETECTED NOT DETECTED Final   Klebsiella pneumoniae NOT DETECTED NOT DETECTED Final   Proteus species NOT DETECTED NOT DETECTED Final   Salmonella species NOT DETECTED NOT DETECTED Final   Serratia marcescens NOT DETECTED NOT DETECTED Final   Haemophilus influenzae NOT DETECTED NOT DETECTED Final   Neisseria meningitidis NOT DETECTED NOT DETECTED Final   Pseudomonas aeruginosa NOT DETECTED NOT DETECTED Final   Stenotrophomonas maltophilia NOT DETECTED NOT DETECTED Final   Candida albicans NOT DETECTED NOT DETECTED Final   Candida auris NOT DETECTED NOT DETECTED Final   Candida glabrata NOT DETECTED NOT DETECTED Final   Candida krusei NOT DETECTED NOT DETECTED Final   Candida parapsilosis NOT DETECTED NOT DETECTED Final   Candida tropicalis NOT DETECTED NOT DETECTED Final   Cryptococcus neoformans/gattii NOT DETECTED NOT DETECTED Final    Comment: Performed at [facility], [address] [place], [place] [address]  Culture, blood (Routine x 2)     Status: Abnormal   Collection Time: 04/06/23 12:03 AM   Specimen: BLOOD LEFT FOREARM  Result Value Ref Range Status   Specimen Description   Final  BLOOD LEFT FOREARM Performed at [facility], [address], [place], [place] [address]    Special Requests   Final    BOTTLES DRAWN AEROBIC AND ANAEROBIC Blood Culture results may not be optimal due to an inadequate volume of blood received in culture bottles Performed at [facility], [address] [place], [place] [address]    Culture  Setup Time   Final    GRAM NEGATIVE RODS BOTTLES DRAWN AEROBIC ONLY CRITICAL VALUE NOTED.  VALUE IS CONSISTENT WITH PREVIOUSLY REPORTED AND CALLED  VALUE.    Culture (A)  Final    FLAVOBACTERIUM ODORATUM SUSCEPTIBILITIES PERFORMED ON PREVIOUS CULTURE WITHIN THE LAST 5 DAYS. Performed at [facility], [address] [place], [place] [address]    Report Status 04/08/2023 FINAL  Final     Radiology Studies: No results found.  Scheduled Meds:  atorvastatin  40 mg Oral q AM   cyanocobalamin  2,000 mcg Oral Q Fri   famotidine  20 mg Oral q AM   ferrous sulfate  325 mg Oral Q breakfast   folic acid  1 mg Oral Daily   furosemide  40 mg Oral Daily   lactulose  20 g Oral TID   levothyroxine  175 mcg Oral Q0600   midodrine  10 mg Oral TID WC   nystatin   Topical TID   pantoprazole  40 mg Oral BID AC   potassium chloride  40 mEq Oral Q4H   rifaximin  550 mg Oral BID   rOPINIRole  0.25 mg Oral QHS   sodium bicarbonate  1,300 mg Oral BID   venlafaxine XR  75 mg Oral Q breakfast   Continuous Infusions:  meropenem (MERREM) IV 1 g (04/08/23 0504)     LOS: 2 days   Time spent: 45 minutes  Carollee Herter, DO  [facility]  04/08/2023, 1:36 PM

## 2023-04-08 NOTE — Plan of Care (Addendum)
  Problem: Education: Goal: Knowledge of General Education information will improve Description: Including pain rating scale, medication(s)/side effects and non-pharmacologic comfort measures 04/08/2023 0214 by Rockne Menghini, RN Outcome: Progressing 04/08/2023 0157 by Rockne Menghini, RN Outcome: Progressing   Problem: Health Behavior/Discharge Planning: Goal: Ability to manage health-related needs will improve 04/08/2023 0214 by Rockne Menghini, RN Outcome: Progressing 04/08/2023 0157 by Rockne Menghini, RN Outcome: Progressing   Problem: Clinical Measurements: Goal: Ability to maintain clinical measurements within normal limits will improve 04/08/2023 0214 by Rockne Menghini, RN Outcome: Progressing 04/08/2023 0157 by Rockne Menghini, RN Outcome: Progressing Goal: Will remain free from infection Outcome: Progressing Goal: Diagnostic test results will improve Outcome: Progressing Goal: Respiratory complications will improve Outcome: Progressing   Problem: Activity: Goal: Risk for activity intolerance will decrease Outcome: Progressing

## 2023-04-08 NOTE — Plan of Care (Addendum)

## 2023-04-08 NOTE — Progress Notes (Signed)
 Mobility Specialist - Progress Note   04/08/23 1113  Mobility  Activity Ambulated with assistance in hallway  Level of Assistance Standby assist, set-up cues, supervision of patient - no hands on  Assistive Device Front wheel walker  Distance Ambulated (ft) 150 ft  Range of Motion/Exercises Active  Activity Response Tolerated fair  Mobility Referral Yes  Mobility visit 1 Mobility  Mobility Specialist Start Time (ACUTE ONLY) 1055  Mobility Specialist Stop Time (ACUTE ONLY) 1113  Mobility Specialist Time Calculation (min) (ACUTE ONLY) 18 min   Pt was found in bed and agreeable to ambulate. Grew fatigued with session and had x1 seated rest break at ~57ft due to fatigue and R knee pain. At EOS returned to recliner chair with all needs met. Son and RN in room.  Billey Chang Mobility Specialist

## 2023-04-09 DIAGNOSIS — D696 Thrombocytopenia, unspecified: Secondary | ICD-10-CM

## 2023-04-09 DIAGNOSIS — K529 Noninfective gastroenteritis and colitis, unspecified: Secondary | ICD-10-CM | POA: Diagnosis not present

## 2023-04-09 DIAGNOSIS — K439 Ventral hernia without obstruction or gangrene: Secondary | ICD-10-CM | POA: Diagnosis not present

## 2023-04-09 DIAGNOSIS — A4150 Gram-negative sepsis, unspecified: Secondary | ICD-10-CM | POA: Diagnosis not present

## 2023-04-09 DIAGNOSIS — L03115 Cellulitis of right lower limb: Secondary | ICD-10-CM | POA: Diagnosis not present

## 2023-04-09 DIAGNOSIS — H1789 Other corneal scars and opacities: Secondary | ICD-10-CM

## 2023-04-09 LAB — COMPREHENSIVE METABOLIC PANEL WITH GFR
ALT: 28 U/L (ref 0–44)
AST: 43 U/L — ABNORMAL HIGH (ref 15–41)
Albumin: 2.1 g/dL — ABNORMAL LOW (ref 3.5–5.0)
Alkaline Phosphatase: 59 U/L (ref 38–126)
Anion gap: 3 — ABNORMAL LOW (ref 5–15)
BUN: 24 mg/dL — ABNORMAL HIGH (ref 8–23)
CO2: 20 mmol/L — ABNORMAL LOW (ref 22–32)
Calcium: 7.8 mg/dL — ABNORMAL LOW (ref 8.9–10.3)
Chloride: 116 mmol/L — ABNORMAL HIGH (ref 98–111)
Creatinine, Ser: 0.91 mg/dL (ref 0.44–1.00)
GFR, Estimated: >60 mL/min (ref >60)
Glucose, Bld: 108 mg/dL — ABNORMAL HIGH (ref 70–99)
Potassium: 4 mmol/L (ref 3.5–5.1)
Sodium: 139 mmol/L (ref 135–145)
Total Bilirubin: 1.3 mg/dL — ABNORMAL HIGH (ref 0.0–1.2)
Total Protein: 4.6 g/dL — ABNORMAL LOW (ref 6.5–8.1)

## 2023-04-09 LAB — MAGNESIUM: Magnesium: 1.9 mg/dL (ref 1.7–2.4)

## 2023-04-09 MED ORDER — SPIRONOLACTONE 25 MG PO TABS
50.0000 mg | ORAL_TABLET | Freq: Every day | ORAL | Status: DC
  Administered 2023-04-09 – 2023-04-14 (×6): 50 mg via ORAL
  Filled 2023-04-09 (×6): qty 2

## 2023-04-09 NOTE — Progress Notes (Signed)
 Mobility Specialist - Progress Note   04/09/23 1116  Mobility  Activity Ambulated with assistance in hallway  Level of Assistance Standby assist, set-up cues, supervision of patient - no hands on  Assistive Device Front wheel walker  Distance Ambulated (ft) 150 ft  Range of Motion/Exercises Active  Activity Response Tolerated well  Mobility Referral Yes  Mobility visit 1 Mobility  Mobility Specialist Start Time (ACUTE ONLY) 1056  Mobility Specialist Stop Time (ACUTE ONLY) 1116  Mobility Specialist Time Calculation (min) (ACUTE ONLY) 20 min   Pt was found on BSC and agreeable to ambulate. Grew fatigued with session and requesting pain meds. RN notified. NT followed with recliner chair and had x1 seated rest break. At EOS returned to recliner chair with all needs met. Call bell in reach.   Billey Chang Mobility Specialist

## 2023-04-09 NOTE — Progress Notes (Signed)
 PROGRESS NOTE    [name]  ZOX:[id number] DOB: Mar 25, 1955 DOA: 04/05/2023 PCP: [name], MD  Subjective: Pt seen and examined. Pt's [name] as bedside.  Scr back to normal. Has already had 2 Bms today. Pt was standing on her own this AM to wipe her anus at bedside commode.   Hospital Course: HPI:  Jaslyne Beeck is a 68 y.o. female with history of liver cirrhosis secondary to [facility], hypothyroidism, hyperlipidemia, GERD, chronic anemia and thrombocytopenia recently admitted to the hospital for hepatic encephalopathy discharged on 04/03/2023 was noticed by patient's son that patient was getting confused and was complaining of pain in the right thigh area.  As per the patient's son patient had a small scratch on the right thigh is not sure if patient herself scratched on the right thigh or the cat scratch at around noontime yesterday.  By evening patient's right thigh became more erythematous and painful and patient also became confused was brought to the ER.  Per patient's son patient has been taking her medications as advised.  She was doing fine until this happened.  Denies any abdominal pain nausea vomiting or diarrhea or chest pain productive cough.   ED Course: In the ER patient was tachycardic with a temperature of 103 F.  On exam patient has a right thigh erythema.  Cultures were obtained started on empiric antibiotics.  Chest x-ray is unremarkable.  Labs show mildly elevated creatinine.  Patient was given sepsis protocol fluid bolus and started on antibiotics.  Lactic acid was elevated.  Significant Events: Admitted 04/05/2023 for right thigh cellulitis   Significant Labs: Na 136, K 3.8, CO2 of 15, BUN 18, Scr 1.12, glu 111 WBC 5.2, HgB 11.7, Plt 119 Lactic acid 3.3 04-05-2023 GNR 2/2 bottle(aerobic) for GNR  Significant Imaging Studies: CXR Shallow inspiration. No evidence of active pulmonary disease  CT right femur Diffuse fat stranding of the subcutaneous lateral and  medial right thigh, extending to the superficial fascia of the underlying anterior and posterior compartment musculature, concerning for cellulitis. These findings are similar to slightly increased  compared to the prior exam. No discrete loculated fluid collection identified. No soft tissue gas. 2. No acute osseous abnormality.  Antibiotic Therapy: Anti-infectives (From admission, onward)    Start     Dose/Rate Route Frequency Ordered Stop   04/07/23 1000  vancomycin (VANCOREADY) IVPB 1250 mg/250 mL        1,250 mg 166.7 mL/hr over 90 Minutes Intravenous Every 36 hours 04/06/23 0537     04/07/23 1000  azithromycin (ZITHROMAX) tablet 500 mg        500 mg Oral Daily 04/07/23 0719     04/06/23 2200  azithromycin (ZITHROMAX) 500 mg in sodium chloride 0.9 % 250 mL IVPB  Status:  Discontinued        500 mg 250 mL/hr over 60 Minutes Intravenous Every 24 hours 04/06/23 2033 04/07/23 0719   04/06/23 2200  ceFEPIme (MAXIPIME) 2 g in sodium chloride 0.9 % 100 mL IVPB        2 g 200 mL/hr over 30 Minutes Intravenous Every 12 hours 04/06/23 2033     04/06/23 1400  metroNIDAZOLE (FLAGYL) IVPB 500 mg  Status:  Discontinued        500 mg 100 mL/hr over 60 Minutes Intravenous Every 12 hours 04/06/23 0527 04/06/23 2032   04/06/23 1200  cefTRIAXone (ROCEPHIN) 2 g in sodium chloride 0.9 % 100 mL IVPB  Status:  Discontinued  2 g 200 mL/hr over 30 Minutes Intravenous Every 24 hours 04/06/23 0526 04/06/23 2032   04/06/23 1000  rifaximin (XIFAXAN) tablet 550 mg        550 mg Oral 2 times daily 04/06/23 0525     04/06/23 0100  ceFEPIme (MAXIPIME) 2 g in sodium chloride 0.9 % 100 mL IVPB        2 g 200 mL/hr over 30 Minutes Intravenous  Once 04/06/23 0048 04/06/23 0146   04/06/23 0100  metroNIDAZOLE (FLAGYL) IVPB 500 mg        500 mg 100 mL/hr over 60 Minutes Intravenous  Once 04/06/23 0048 04/06/23 0317   04/06/23 0100  vancomycin (VANCOCIN) IVPB 1000 mg/200 mL premix        1,000 mg 200 mL/hr over 60  Minutes Intravenous  Once 04/06/23 0048 04/06/23 0421       Procedures:   Consultants:     Assessment and Plan: * Sepsis (HCC) 04-07-2023 present on admission with AKI. Lactic acid 3.3. right thigh cellulitis. On IV ABX with meropenem. Prior hx of ESBL pseudomonas septicemia in 09-2022  Cellulitis of right thigh 04-07-2023 On IV ABX with meropenem. Prior hx of ESBL pseudomonas septicemia in 09-2022. Not much erythema right inner medial thigh. Tender to touch.  04-08-2023 blood cx growing FLAVOBACTERIUM ODORATUM. On IV meropenem due to resistance to cipro/zosyn. Will wait for ID to return on Monday to decide on length of treatment course. Right inner thigh without much erythema. Pt is developing some weeping edema. Will need to restart lasix.  04-09-2023 lasix restarted yesterday. Restart aldactone today. Scr back to normal. Still with some weeping on right outer thigh. Right inner medial thigh is much less indurated. Minimal erythema.  Gram negative septicemia (HCC) 04-07-2023 blood cx growing GNR. ID consulted. On IV ABX with meropenem. Prior hx of ESBL pseudomonas septicemia in 09-2022  04-08-2023 blood cx growing FLAVOBACTERIUM ODORATUM. On IV meropenem due to resistance to cipro/zosyn. Will wait for ID to return on Monday to decide on length of treatment course.  04-09-2023 continue with IV meropenem.  Day # 3. Awaiting ID to give IV abx duration recommendations.  ARF (acute renal failure) (HCC) 04-07-2023 Scr is usually normal. Scr 1.3  04-08-2023 will need to restart po lasix due to weeping edema of her thighs  04-09-2023 Scr back to normal 0.91 today. Back on lasix. Restart aldcatone due to edema of thighs.  RLS (restless legs syndrome) 04-07-2023 chronic   04-08-2023 chronic. Stable  04-09-2023 chronic. 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 Stable  04-09-2023 chronic. stable  Hypomagnesemia 04-07-2023 replete with IV  Mg  04-08-2023 resolved.  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 Stable  04-09-2023 chronic. stable  Obesity, Class II, BMI 35-39.9 Body mass index is 38.15 kg/m.   Short gut syndrome 04-07-2023 chronic. Has chronic diarrhea  04-08-2023 chronic. Stable  04-09-2023 chronic. stable  Chronic diarrhea 04-07-2023 chronic   04-08-2023 chronic. Stable  04-09-2023 chronic. stable  Hypothyroidism 04-07-2023 chronic   04-08-2023 chronic. Stable  04-09-2023 chronic. Stable.   Thrombocytopenia (HCC) - Baseline 100-130K 04-07-2023 chronic. Due to her cirrhosis. Baseline 100-130K. Stop heparin  04-09-2023 repeat CBC in AM to check plt. Last dose of SQ heparin was 04-07-2023 @ 1335  Cirrhosis due to NASH 04-07-2023 chronic. Hx of repeated bouts of hepatic encephalopathy. Continue with Xifaxan and lactulose.  04-08-2023 stable. Continue xifaxan and  lactulose.  04-09-2023 stable. Continue xifaxan and lactulose.  DVT prophylaxis:   SCDs   Code Status: Limited: Do not attempt resuscitation (DNR) -DNR-LIMITED -Do Not Intubate/DNI  Family Communication: discussed at bedside with [name] Disposition Plan: return home Reason for continuing need for hospitalization: remains on IV Abx.  Objective: Vitals:   04/08/23 0500 04/08/23 1411 04/08/23 2011 04/09/23 0603  BP: 134/61 (!) 112/58 (!) 139/49 (!) 106/50  Pulse: 64 71 (!) 57 68  Resp: 16 18 20 20   Temp: 98.4 F (36.9 C) 98 F (36.7 C) 97.8 F (36.6 C) 97.7 F (36.5 C)  TempSrc: Oral Oral Oral Oral  SpO2: 100% 100% 96% 100%  Weight:      Height:        Intake/Output Summary (Last 24 hours) at 04/09/2023 1041 Last data filed at 04/08/2023 1808 Gross per 24 hour  Intake 340 ml  Output --  Net 340 ml   Filed Weights   04/05/23 2301  Weight: 94.6 kg    Examination:  Physical Exam Vitals and nursing note reviewed.   Constitutional:      General: She is not in acute distress.    Appearance: She is obese. She is not toxic-appearing or diaphoretic.  HENT:     Head: Normocephalic and atraumatic.  Eyes:     Comments: Chronic left eye leukoma  Cardiovascular:     Rate and Rhythm: Normal rate and regular rhythm.  Pulmonary:     Effort: Pulmonary effort is normal. No respiratory distress.     Breath sounds: No wheezing.  Abdominal:     General: Abdomen is protuberant. Bowel sounds are normal.     Palpations: Abdomen is soft.  Skin:    General: Skin is warm and dry.     Capillary Refill: Capillary refill takes less than 2 seconds.          Comments: Pinpoint area of serous weeping on right outer thigh  Right inner medial thigh much less indurated. Minimal erythema.  Neurological:     Mental Status: She is alert and oriented to person, place, and time.   Data Reviewed: I have personally reviewed following labs and imaging studies  CBC: Recent Labs  Lab 04/03/23 0506 04/05/23 2324 04/06/23 0632 04/08/23 0431  WBC 5.0 5.7 8.7 10.5  NEUTROABS 2.7 5.1 7.5 7.6  HGB 9.0* 11.7* 9.4* 8.5*  HCT 30.4* 37.0 30.3* 28.7*  MCV 98.4 93.9 94.4 98.0  PLT 105* 119* 81* 109*   Basic Metabolic Panel: Recent Labs  Lab 04/03/23 0506 04/05/23 2324 04/06/23 0632 04/07/23 0335 04/08/23 0431 04/09/23 0347  NA 138 136 133* 134* 136 139  K 3.7 3.8 3.3* 3.8 3.4* 4.0  CL 116* 112* 110 111 113* 116*  CO2 18* 15* 16* 16* 17* 20*  GLUCOSE 111* 111* 120* 88 134* 108*  BUN 14 18 19  24* 27* 24*  CREATININE 0.85 1.12* 1.23* 1.30* 1.28* 0.91  CALCIUM 7.8* 8.5* 8.1* 7.6* 7.7* 7.8*  MG 1.8  --   --  1.5* 2.0 1.9  PHOS 3.8  --   --   --   --   --    GFR: Estimated Creatinine Clearance: 66.1 mL/min (by C-G formula based on SCr of 0.91 mg/dL). Liver Function Tests: Recent Labs  Lab 04/05/23 2324 04/06/23 7829 04/07/23 0335 04/08/23 0431 04/09/23 0347  AST 40 47* 55* 49* 43*  ALT 21 20 24 26 28   ALKPHOS 94  60 48 61 59  BILITOT 2.5* 2.1*  1.5* 1.4* 1.3*  PROT 6.3* 4.8* 4.7* 4.5* 4.6*  ALBUMIN 3.0* 2.3* 2.1* 2.0* 2.1*   Recent Labs  Lab 04/06/23 0116  AMMONIA 46*   Coagulation Profile: Recent Labs  Lab 04/06/23 0003  INR 1.8*   BNP (last 3 results) Recent Labs    08/30/22 0354 01/03/23 0616  BNP 614.9* 469.4*   Sepsis Labs: Recent Labs  Lab 04/06/23 0030 04/07/23 0335  LATICACIDVEN 3.3* 1.3    Recent Results (from the past 240 hours)  Culture, blood (Routine x 2)     Status: Abnormal   Collection Time: 04/05/23 11:24 PM   Specimen: BLOOD  Result Value Ref Range Status   Specimen Description   Final    BLOOD LEFT ANTECUBITAL Performed at [facility], [address], [place], [place] [address]    Special Requests   Final    BOTTLES DRAWN AEROBIC AND ANAEROBIC Blood Culture adequate volume Performed at [facility], [address], [place], [place] [address]    Culture  Setup Time   Final    GRAM NEGATIVE RODS BOTTLES DRAWN AEROBIC ONLY CRITICAL RESULT CALLED TO, READ BACK BY AND VERIFIED WITH: Abe People [id number] @1955  FH Performed at [facility], [address] [place], [place] [address]    Culture FLAVOBACTERIUM ODORATUM (A)  Final   Report Status 04/08/2023 FINAL  Final   Organism ID, Bacteria FLAVOBACTERIUM ODORATUM  Final      Susceptibility   Flavobacterium odoratum - MIC*    CEFTAZIDIME >=64 RESISTANT Resistant     CIPROFLOXACIN >=4 RESISTANT Resistant     GENTAMICIN >=16 RESISTANT Resistant     IMIPENEM 2 SENSITIVE Sensitive     TRIMETH/SULFA >=320 RESISTANT Resistant     PIP/TAZO >=128 RESISTANT Resistant ug/mL    * FLAVOBACTERIUM ODORATUM  Blood Culture ID Panel (Reflexed)     Status: None   Collection Time: 04/05/23 11:24 PM  Result Value Ref Range Status   Enterococcus faecalis NOT DETECTED NOT DETECTED Final   Enterococcus Faecium NOT DETECTED NOT DETECTED Final   Listeria monocytogenes NOT  DETECTED NOT DETECTED Final   Staphylococcus species NOT DETECTED NOT DETECTED Final   Staphylococcus aureus (BCID) NOT DETECTED NOT DETECTED Final   Staphylococcus epidermidis NOT DETECTED NOT DETECTED Final   Staphylococcus lugdunensis NOT DETECTED NOT DETECTED Final   Streptococcus species NOT DETECTED NOT DETECTED Final   Streptococcus agalactiae NOT DETECTED NOT DETECTED Final   Streptococcus pneumoniae NOT DETECTED NOT DETECTED Final   Streptococcus pyogenes NOT DETECTED NOT DETECTED Final   A.calcoaceticus-baumannii NOT DETECTED NOT DETECTED Final   Bacteroides fragilis NOT DETECTED NOT DETECTED Final   Enterobacterales NOT DETECTED NOT DETECTED Final   Enterobacter cloacae complex NOT DETECTED NOT DETECTED Final   Escherichia coli NOT DETECTED NOT DETECTED Final   Klebsiella aerogenes NOT DETECTED NOT DETECTED Final   Klebsiella oxytoca NOT DETECTED NOT DETECTED Final   Klebsiella pneumoniae NOT DETECTED NOT DETECTED Final   Proteus species NOT DETECTED NOT DETECTED Final   Salmonella species NOT DETECTED NOT DETECTED Final   Serratia marcescens NOT DETECTED NOT DETECTED Final   Haemophilus influenzae NOT DETECTED NOT DETECTED Final   Neisseria meningitidis NOT DETECTED NOT DETECTED Final   Pseudomonas aeruginosa NOT DETECTED NOT DETECTED Final   Stenotrophomonas maltophilia NOT DETECTED NOT DETECTED Final   Candida albicans NOT DETECTED NOT DETECTED Final   Candida auris NOT DETECTED NOT DETECTED Final   Candida glabrata NOT DETECTED NOT  DETECTED Final   Candida krusei NOT DETECTED NOT DETECTED Final   Candida parapsilosis NOT DETECTED NOT DETECTED Final   Candida tropicalis NOT DETECTED NOT DETECTED Final   Cryptococcus neoformans/gattii NOT DETECTED NOT DETECTED Final    Comment: Performed at [facility], [address], [place], [place] [address]  Culture, blood (Routine x 2)     Status: Abnormal   Collection Time: 04/06/23 12:03 AM   Specimen: BLOOD LEFT FOREARM   Result Value Ref Range Status   Specimen Description   Final    BLOOD LEFT FOREARM Performed at [facility], [address] [place], [place] [address]    Special Requests   Final    BOTTLES DRAWN AEROBIC AND ANAEROBIC Blood Culture results may not be optimal due to an inadequate volume of blood received in culture bottles Performed at [facility], [address] [place], [place] [address]    Culture  Setup Time   Final    GRAM NEGATIVE RODS BOTTLES DRAWN AEROBIC ONLY CRITICAL VALUE NOTED.  VALUE IS CONSISTENT WITH PREVIOUSLY REPORTED AND CALLED VALUE.    Culture (A)  Final    FLAVOBACTERIUM ODORATUM SUSCEPTIBILITIES PERFORMED ON PREVIOUS CULTURE WITHIN THE LAST 5 DAYS. Performed at [facility], [address], [place], [place] [address]    Report Status 04/08/2023 FINAL  Final   Scheduled Meds:  atorvastatin  40 mg Oral q AM   cyanocobalamin  2,000 mcg Oral Q Fri   famotidine  20 mg Oral q AM   ferrous sulfate  325 mg Oral Q breakfast   folic acid  1 mg Oral Daily   furosemide  40 mg Oral Daily   lactulose  20 g Oral TID   levothyroxine  175 mcg Oral Q0600   midodrine  10 mg Oral TID WC   nystatin   Topical TID   pantoprazole  40 mg Oral BID AC   rifaximin  550 mg Oral BID   rOPINIRole  0.25 mg Oral QHS   sodium bicarbonate  1,300 mg Oral BID   spironolactone  50 mg Oral Daily   venlafaxine XR  75 mg Oral Q breakfast   Continuous Infusions:  meropenem (MERREM) IV 1 g (04/09/23 0634)     LOS: 3 days   Time spent: 40 minutes  Carollee Herter, DO  [facility]  04/09/2023, 10:41 AM

## 2023-04-09 NOTE — Progress Notes (Signed)
 Report received from an RN. Agreed with nurse assessment of pt and will cont to monitor.

## 2023-04-10 DIAGNOSIS — R6520 Severe sepsis without septic shock: Secondary | ICD-10-CM | POA: Diagnosis not present

## 2023-04-10 DIAGNOSIS — A4150 Gram-negative sepsis, unspecified: Secondary | ICD-10-CM | POA: Diagnosis not present

## 2023-04-10 DIAGNOSIS — E66812 Obesity, class 2: Secondary | ICD-10-CM

## 2023-04-10 DIAGNOSIS — N179 Acute kidney failure, unspecified: Secondary | ICD-10-CM | POA: Diagnosis not present

## 2023-04-10 DIAGNOSIS — L03115 Cellulitis of right lower limb: Secondary | ICD-10-CM | POA: Diagnosis not present

## 2023-04-10 DIAGNOSIS — K529 Noninfective gastroenteritis and colitis, unspecified: Secondary | ICD-10-CM | POA: Diagnosis not present

## 2023-04-10 DIAGNOSIS — K7460 Unspecified cirrhosis of liver: Secondary | ICD-10-CM | POA: Diagnosis not present

## 2023-04-10 DIAGNOSIS — A419 Sepsis, unspecified organism: Secondary | ICD-10-CM | POA: Diagnosis not present

## 2023-04-10 LAB — BASIC METABOLIC PANEL WITH GFR
Anion gap: 5 (ref 5–15)
BUN: 19 mg/dL (ref 8–23)
CO2: 21 mmol/L — ABNORMAL LOW (ref 22–32)
Calcium: 7.9 mg/dL — ABNORMAL LOW (ref 8.9–10.3)
Chloride: 112 mmol/L — ABNORMAL HIGH (ref 98–111)
Creatinine, Ser: 0.83 mg/dL (ref 0.44–1.00)
GFR, Estimated: >60 mL/min (ref >60)
Glucose, Bld: 78 mg/dL (ref 70–99)
Potassium: 3.9 mmol/L (ref 3.5–5.1)
Sodium: 138 mmol/L (ref 135–145)

## 2023-04-10 LAB — CBC WITH DIFFERENTIAL/PLATELET
Abs Immature Granulocytes: 0.02 10*3/uL (ref 0.00–0.07)
Basophils Absolute: 0 10*3/uL (ref 0.0–0.1)
Basophils Relative: 1 %
Eosinophils Absolute: 0.6 10*3/uL — ABNORMAL HIGH (ref 0.0–0.5)
Eosinophils Relative: 10 %
HCT: 29.9 % — ABNORMAL LOW (ref 36.0–46.0)
Hemoglobin: 9.1 g/dL — ABNORMAL LOW (ref 12.0–15.0)
Immature Granulocytes: 0 %
Lymphocytes Relative: 26 %
Lymphs Abs: 1.6 10*3/uL (ref 0.7–4.0)
MCH: 29.3 pg (ref 26.0–34.0)
MCHC: 30.4 g/dL (ref 30.0–36.0)
MCV: 96.1 fL (ref 80.0–100.0)
Monocytes Absolute: 0.6 10*3/uL (ref 0.1–1.0)
Monocytes Relative: 11 %
Neutro Abs: 3.2 10*3/uL (ref 1.7–7.7)
Neutrophils Relative %: 52 %
Platelets: 108 10*3/uL — ABNORMAL LOW (ref 150–400)
RBC: 3.11 MIL/uL — ABNORMAL LOW (ref 3.87–5.11)
RDW: 17 % — ABNORMAL HIGH (ref 11.5–15.5)
WBC: 6.1 10*3/uL (ref 4.0–10.5)
nRBC: 0 % (ref 0.0–0.2)

## 2023-04-10 MED ORDER — CEFADROXIL 500 MG PO CAPS
1000.0000 mg | ORAL_CAPSULE | Freq: Two times a day (BID) | ORAL | Status: DC
  Administered 2023-04-14: 1000 mg via ORAL
  Filled 2023-04-10: qty 2

## 2023-04-10 NOTE — Plan of Care (Signed)
   Problem: Clinical Measurements: Goal: Ability to maintain clinical measurements within normal limits will improve Outcome: Progressing Goal: Diagnostic test results will improve Outcome: Progressing   Problem: Activity: Goal: Risk for activity intolerance will decrease Outcome: Progressing

## 2023-04-10 NOTE — Progress Notes (Signed)
 Mobility Specialist - Progress Note   04/10/23 1130  Mobility  Activity Ambulated with assistance in hallway  Level of Assistance Standby assist, set-up cues, supervision of patient - no hands on  Assistive Device Front wheel walker  Distance Ambulated (ft) 70 ft  Range of Motion/Exercises Active  Activity Response Tolerated well  Mobility Referral Yes  Mobility visit 1 Mobility  Mobility Specialist Start Time (ACUTE ONLY) 1115  Mobility Specialist Stop Time (ACUTE ONLY) 1130  Mobility Specialist Time Calculation (min) (ACUTE ONLY) 15 min   Pt was found in bed and agreeable to ambulate. Grew fatigued with session. At EOS returned to recliner chair with all needs met. Call bell in reach.  Billey Chang Mobility Specialist

## 2023-04-10 NOTE — Progress Notes (Signed)
 PROGRESS NOTE    [name]  JYN:[id number] DOB: Jul 04, 1955 DOA: 04/05/2023 PCP: [name], MD  Subjective: Pt seen and examined. Doing well. Continues to walk with PT. Feeling well. Wants to know when she can be discharged from hospital.   Hospital Course: HPI:  [name] is a 68 y.o. female with history of liver cirrhosis secondary to [facility], hypothyroidism, hyperlipidemia, GERD, chronic anemia and thrombocytopenia recently admitted to the hospital for hepatic encephalopathy discharged on 04/03/2023 was noticed by patient's son that patient was getting confused and was complaining of pain in the right thigh area.  As per the patient's son patient had a small scratch on the right thigh is not sure if patient herself scratched on the right thigh or the cat scratch at around noontime yesterday.  By evening patient's right thigh became more erythematous and painful and patient also became confused was brought to the ER.  Per patient's son patient has been taking her medications as advised.  She was doing fine until this happened.  Denies any abdominal pain nausea vomiting or diarrhea or chest pain productive cough.   ED Course: In the ER patient was tachycardic with a temperature of 103 F.  On exam patient has a right thigh erythema.  Cultures were obtained started on empiric antibiotics.  Chest x-ray is unremarkable.  Labs show mildly elevated creatinine.  Patient was given sepsis protocol fluid bolus and started on antibiotics.  Lactic acid was elevated.  Significant Events: Admitted 04/05/2023 for right thigh cellulitis   Significant Labs: Na 136, K 3.8, CO2 of 15, BUN 18, Scr 1.12, glu 111 WBC 5.2, HgB 11.7, Plt 119 Lactic acid 3.3 04-05-2023 GNR 2/2 bottle(aerobic) for GNR  Significant Imaging Studies: CXR Shallow inspiration. No evidence of active pulmonary disease  CT right femur Diffuse fat stranding of the subcutaneous lateral and medial right thigh, extending to the  superficial fascia of the underlying anterior and posterior compartment musculature, concerning for cellulitis. These findings are similar to slightly increased  compared to the prior exam. No discrete loculated fluid collection identified. No soft tissue gas. 2. No acute osseous abnormality.  Antibiotic Therapy: Anti-infectives (From admission, onward)    Start     Dose/Rate Route Frequency Ordered Stop   04/07/23 1000  vancomycin (VANCOREADY) IVPB 1250 mg/250 mL        1,250 mg 166.7 mL/hr over 90 Minutes Intravenous Every 36 hours 04/06/23 0537     04/07/23 1000  azithromycin (ZITHROMAX) tablet 500 mg        500 mg Oral Daily 04/07/23 0719     04/06/23 2200  azithromycin (ZITHROMAX) 500 mg in sodium chloride 0.9 % 250 mL IVPB  Status:  Discontinued        500 mg 250 mL/hr over 60 Minutes Intravenous Every 24 hours 04/06/23 2033 04/07/23 0719   04/06/23 2200  ceFEPIme (MAXIPIME) 2 g in sodium chloride 0.9 % 100 mL IVPB        2 g 200 mL/hr over 30 Minutes Intravenous Every 12 hours 04/06/23 2033     04/06/23 1400  metroNIDAZOLE (FLAGYL) IVPB 500 mg  Status:  Discontinued        500 mg 100 mL/hr over 60 Minutes Intravenous Every 12 hours 04/06/23 0527 04/06/23 2032   04/06/23 1200  cefTRIAXone (ROCEPHIN) 2 g in sodium chloride 0.9 % 100 mL IVPB  Status:  Discontinued        2 g 200 mL/hr over 30 Minutes Intravenous Every 24 hours  04/06/23 0526 04/06/23 2032   04/06/23 1000  rifaximin (XIFAXAN) tablet 550 mg        550 mg Oral 2 times daily 04/06/23 0525     04/06/23 0100  ceFEPIme (MAXIPIME) 2 g in sodium chloride 0.9 % 100 mL IVPB        2 g 200 mL/hr over 30 Minutes Intravenous  Once 04/06/23 0048 04/06/23 0146   04/06/23 0100  metroNIDAZOLE (FLAGYL) IVPB 500 mg        500 mg 100 mL/hr over 60 Minutes Intravenous  Once 04/06/23 0048 04/06/23 0317   04/06/23 0100  vancomycin (VANCOCIN) IVPB 1000 mg/200 mL premix        1,000 mg 200 mL/hr over 60 Minutes Intravenous  Once 04/06/23  0048 04/06/23 0421       Procedures:   Consultants:     Assessment and Plan: * Sepsis (HCC) 04-07-2023 present on admission with AKI. Lactic acid 3.3. right thigh cellulitis. On IV ABX with meropenem. Prior hx of ESBL pseudomonas septicemia in 09-2022  Cellulitis of right thigh 04-07-2023 On IV ABX with meropenem. Prior hx of ESBL pseudomonas septicemia in 09-2022. Not much erythema right inner medial thigh. Tender to touch.  04-08-2023 blood cx growing FLAVOBACTERIUM ODORATUM. On IV meropenem due to resistance to cipro/zosyn. Will wait for ID to return on Monday to decide on length of treatment course. Right inner thigh without much erythema. Pt is developing some weeping edema. Will need to restart lasix.  04-09-2023 lasix restarted yesterday. Restart aldactone today. Scr back to normal. Still with some weeping on right outer thigh. Right inner medial thigh is much less indurated. Minimal erythema.  04-10-2023 only slightly tender in most distant portion of her right inner thigh pannus. Nearly all the erythema in right thigh pannus has resolved.  Gram negative septicemia (HCC) 04-07-2023 blood cx growing GNR. ID consulted. On IV ABX with meropenem. Prior hx of ESBL pseudomonas septicemia in 09-2022  04-08-2023 blood cx growing FLAVOBACTERIUM ODORATUM. On IV meropenem due to resistance to cipro/zosyn. Will wait for ID to return on Monday to decide on length of treatment course.  04-09-2023 continue with IV meropenem.  Day # 3. Awaiting ID to give IV abx duration recommendations.  04-10-2023 Day #4 Meropenem. Awaiting ID f/u to determine duration of treatment. Due to right thigh cellulitis.  ARF (acute renal failure) (HCC) 04-07-2023 Scr is usually normal. Scr 1.3  04-08-2023 will need to restart po lasix due to weeping edema of her thighs  04-09-2023 Scr back to normal 0.91 today. Back on lasix. Restart aldcatone due to edema of thighs.  04-10-2023  AKI/ARF has resolved. Scr  0.83 after restarting lasix and aldactone yesterday.  RLS (restless legs syndrome) 04-07-2023 chronic   04-08-2023 chronic. Stable  04-09-2023 chronic. 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 Stable  04-09-2023 chronic. Stable  04-10-2023 stable  Hypomagnesemia 04-07-2023 replete with IV Mg  04-08-2023 resolved.  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 Stable  04-09-2023 chronic. Stable  04-10-2023 stable  Obesity, Class II, BMI 35-39.9 Body mass index is 38.15 kg/m.   Short gut syndrome 04-07-2023 chronic. Has chronic diarrhea  04-08-2023 chronic. Stable  04-09-2023 chronic. Stable  04-10-2023 stable  Chronic diarrhea 04-07-2023 chronic   04-08-2023 chronic. Stable  04-09-2023 chronic. Stable  04-10-2023 stable  Hypothyroidism 04-07-2023 chronic   04-08-2023 chronic. Stable  04-09-2023 chronic.  Stable.   04-10-2023 stable  Thrombocytopenia (HCC) - Baseline 100-130K 04-07-2023 chronic. Due to her cirrhosis. Baseline 100-130K. Stop heparin  04-09-2023 repeat CBC in AM to check plt. Last dose of SQ heparin was 04-07-2023 @ 1335  04-10-2023 stable. Plt count 108K today.  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 Hx of repeated bouts of hepatic encephalopathy. Continue with Xifaxan and lactulose.  04-08-2023 stable. Continue xifaxan and lactulose.  04-09-2023 stable. Continue xifaxan and lactulose.  04-10-2023 stable   DVT prophylaxis:   SCDs   Code Status: Limited: Do not attempt resuscitation (DNR) -DNR-LIMITED -Do Not Intubate/DNI  Family Communication: no family at bedside. Spoke with [name] yesterday Disposition Plan: return home Reason for continuing need for hospitalization: remains on IV meropenem.  Objective: Vitals:   04/09/23 0603 04/09/23 1340 04/09/23 2045 04/10/23 0431  BP:  (!) 106/50 (!) 101/53 (!) 120/55 108/61  Pulse: 68 67 65 72  Resp: 20 18 20 20   Temp: 97.7 F (36.5 C) 97.6 F (36.4 C) 98.1 F (36.7 C) 98.1 F (36.7 C)  TempSrc: Oral Oral Oral Oral  SpO2: 100% 97% 100% 98%  Weight:      Height:       No intake or output data in the 24 hours ending 04/10/23 1119 Filed Weights   04/05/23 2301  Weight: 94.6 kg    Examination:  Physical Exam Vitals and nursing note reviewed.  Constitutional:      General: She is not in acute distress.    Appearance: She is obese. She is not toxic-appearing or diaphoretic.  HENT:     Head: Normocephalic and atraumatic.     Nose: Nose normal.  Eyes:     Comments: Chronic left eye leukoma  Cardiovascular:     Rate and Rhythm: Normal rate and regular rhythm.  Pulmonary:     Effort: Pulmonary effort is normal.     Breath sounds: Normal breath sounds.  Abdominal:     General: Bowel sounds are normal.     Palpations: Abdomen is soft.  Skin:    General: Skin is warm and dry.     Capillary Refill: Capillary refill takes less than 2 seconds.          Comments: Only mild tenderness in distant most right inner thigh pannus  Neurological:     Mental Status: She is alert and oriented to person, place, and time.     Data Reviewed: I have personally reviewed following labs and imaging studies  CBC: Recent Labs  Lab 04/05/23 2324 04/06/23 0632 04/08/23 0431 04/10/23 0327  WBC 5.7 8.7 10.5 6.1  NEUTROABS 5.1 7.5 7.6 3.2  HGB 11.7* 9.4* 8.5* 9.1*  HCT 37.0 30.3* 28.7* 29.9*  MCV 93.9 94.4 98.0 96.1  PLT 119* 81* 109* 108*   Basic Metabolic Panel: Recent Labs  Lab 04/06/23 0632 04/07/23 0335 04/08/23 0431 04/09/23 0347 04/10/23 0327  NA 133* 134* 136 139 138  K 3.3* 3.8 3.4* 4.0 3.9  CL 110 111 113* 116* 112*  CO2 16* 16* 17* 20* 21*  GLUCOSE 120* 88 134* 108* 78  BUN 19 24* 27* 24* 19  CREATININE 1.23* 1.30* 1.28* 0.91 0.83  CALCIUM 8.1* 7.6* 7.7* 7.8* 7.9*  MG  --  1.5* 2.0 1.9  --     GFR: Estimated Creatinine Clearance: 72.4 mL/min (by C-G formula based on SCr of 0.83 mg/dL). Liver Function Tests: Recent Labs  Lab 04/05/23 2324 04/06/23 0981 04/07/23 0335 04/08/23 0431 04/09/23 0347  AST  40 47* 55* 49* 43*  ALT 21 20 24 26 28   ALKPHOS 94 60 48 61 59  BILITOT 2.5* 2.1* 1.5* 1.4* 1.3*  PROT 6.3* 4.8* 4.7* 4.5* 4.6*  ALBUMIN 3.0* 2.3* 2.1* 2.0* 2.1*    Recent Labs  Lab 04/06/23 0116  AMMONIA 46*   Coagulation Profile: Recent Labs  Lab 04/06/23 0003  INR 1.8*   BNP (last 3 results) Recent Labs    08/30/22 0354 01/03/23 0616  BNP 614.9* 469.4*   Sepsis Labs: Recent Labs  Lab 04/06/23 0030 04/07/23 0335  LATICACIDVEN 3.3* 1.3    Recent Results (from the past 240 hours)  Culture, blood (Routine x 2)     Status: Abnormal   Collection Time: 04/05/23 11:24 PM   Specimen: BLOOD  Result Value Ref Range Status   Specimen Description   Final    BLOOD LEFT ANTECUBITAL Performed at [facility], [address], [place], [place] [address]    Special Requests   Final    BOTTLES DRAWN AEROBIC AND ANAEROBIC Blood Culture adequate volume Performed at [facility], [address] [place], [place] [address]    Culture  Setup Time   Final    GRAM NEGATIVE RODS BOTTLES DRAWN AEROBIC ONLY CRITICAL RESULT CALLED TO, READ BACK BY AND VERIFIED WITH: Abe People [id number] @1955  FH Performed at [facility], [address], [place], [place] [address]    Culture FLAVOBACTERIUM ODORATUM (A)  Final   Report Status 04/08/2023 FINAL  Final   Organism ID, Bacteria FLAVOBACTERIUM ODORATUM  Final      Susceptibility   Flavobacterium odoratum - MIC*    CEFTAZIDIME >=64 RESISTANT Resistant     CIPROFLOXACIN >=4 RESISTANT Resistant     GENTAMICIN >=16 RESISTANT Resistant     IMIPENEM 2 SENSITIVE Sensitive     TRIMETH/SULFA >=320 RESISTANT Resistant     PIP/TAZO >=128 RESISTANT Resistant ug/mL    * FLAVOBACTERIUM  ODORATUM  Blood Culture ID Panel (Reflexed)     Status: None   Collection Time: 04/05/23 11:24 PM  Result Value Ref Range Status   Enterococcus faecalis NOT DETECTED NOT DETECTED Final   Enterococcus Faecium NOT DETECTED NOT DETECTED Final   Listeria monocytogenes NOT DETECTED NOT DETECTED Final   Staphylococcus species NOT DETECTED NOT DETECTED Final   Staphylococcus aureus (BCID) NOT DETECTED NOT DETECTED Final   Staphylococcus epidermidis NOT DETECTED NOT DETECTED Final   Staphylococcus lugdunensis NOT DETECTED NOT DETECTED Final   Streptococcus species NOT DETECTED NOT DETECTED Final   Streptococcus agalactiae NOT DETECTED NOT DETECTED Final   Streptococcus pneumoniae NOT DETECTED NOT DETECTED Final   Streptococcus pyogenes NOT DETECTED NOT DETECTED Final   A.calcoaceticus-baumannii NOT DETECTED NOT DETECTED Final   Bacteroides fragilis NOT DETECTED NOT DETECTED Final   Enterobacterales NOT DETECTED NOT DETECTED Final   Enterobacter cloacae complex NOT DETECTED NOT DETECTED Final   Escherichia coli NOT DETECTED NOT DETECTED Final   Klebsiella aerogenes NOT DETECTED NOT DETECTED Final   Klebsiella oxytoca NOT DETECTED NOT DETECTED Final   Klebsiella pneumoniae NOT DETECTED NOT DETECTED Final   Proteus species NOT DETECTED NOT DETECTED Final   Salmonella species NOT DETECTED NOT DETECTED Final   Serratia marcescens NOT DETECTED NOT DETECTED Final   Haemophilus influenzae NOT DETECTED NOT DETECTED Final   Neisseria meningitidis NOT DETECTED NOT DETECTED Final   Pseudomonas aeruginosa NOT DETECTED NOT DETECTED Final   Stenotrophomonas maltophilia NOT DETECTED NOT DETECTED Final  Candida albicans NOT DETECTED NOT DETECTED Final   Candida auris NOT DETECTED NOT DETECTED Final   Candida glabrata NOT DETECTED NOT DETECTED Final   Candida krusei NOT DETECTED NOT DETECTED Final   Candida parapsilosis NOT DETECTED NOT DETECTED Final   Candida tropicalis NOT DETECTED NOT DETECTED Final    Cryptococcus neoformans/gattii NOT DETECTED NOT DETECTED Final    Comment: Performed at [facility], [address], [place], [place] [address]  Culture, blood (Routine x 2)     Status: Abnormal   Collection Time: 04/06/23 12:03 AM   Specimen: BLOOD LEFT FOREARM  Result Value Ref Range Status   Specimen Description   Final    BLOOD LEFT FOREARM Performed at [facility], [address], [place], [place] [address]    Special Requests   Final    BOTTLES DRAWN AEROBIC AND ANAEROBIC Blood Culture results may not be optimal due to an inadequate volume of blood received in culture bottles Performed at [facility], [address] [place], [place] [address]    Culture  Setup Time   Final    GRAM NEGATIVE RODS BOTTLES DRAWN AEROBIC ONLY CRITICAL VALUE NOTED.  VALUE IS CONSISTENT WITH PREVIOUSLY REPORTED AND CALLED VALUE.    Culture (A)  Final    FLAVOBACTERIUM ODORATUM SUSCEPTIBILITIES PERFORMED ON PREVIOUS CULTURE WITHIN THE LAST 5 DAYS. Performed at [facility], [address], [place], [place] [address]    Report Status 04/08/2023 FINAL  Final     Radiology Studies: No results found.  Scheduled Meds:  atorvastatin  40 mg Oral q AM   cyanocobalamin  2,000 mcg Oral Q Fri   famotidine  20 mg Oral q AM   ferrous sulfate  325 mg Oral Q breakfast   folic acid  1 mg Oral Daily   furosemide  40 mg Oral Daily   lactulose  20 g Oral TID   levothyroxine  175 mcg Oral Q0600   midodrine  10 mg Oral TID WC   nystatin   Topical TID   pantoprazole  40 mg Oral BID AC   rifaximin  550 mg Oral BID   rOPINIRole  0.25 mg Oral QHS   sodium bicarbonate  1,300 mg Oral BID   spironolactone  50 mg Oral Daily   venlafaxine XR  75 mg Oral Q breakfast   Continuous Infusions:  meropenem (MERREM) IV 1 g (04/10/23 1100)     LOS: 4 days   Time spent: 40 minutes  Carollee Herter, DO  [facility]  04/10/2023, 11:19 AM

## 2023-04-10 NOTE — Progress Notes (Signed)
 [facility]    Date of Admission:  04/05/2023     Reason for Consult: bacteremia    Referring Provider: Imogene Burn     Lines:  Peripheral iv's  Abx: 3/28-c meropenem       Azith cefepime Vanc  Assessment: 68 year old female with history of decompensated alcoholic cirrhosis, prior several episodes gnr bacteremia (kleb pna 12/2022, PsA 09/2022, flavibacterium odoratum 08/2022 and 04/2022), readmitted 04/05/23 for 1 day rapidly worsening rle cellulitis found to have gnr bacteremia as well  Bcid gnr panel couldn't identify. Prior flavibacterium only sensitive to imipenem  Patient started on cefepime and had been improving (mentation/right leg pain)  ?patient scratching the leg and introduced flavibacterium into the blood stream. Streptococcal spectrum cellulitis on physical examination.  Exam showed no abscess or sign of necrotizing fasciitis on rle  No abdominal tenderness although cr up from likely sepsis but will need monitoring to r/o hrs type 2; choice of current abx should cover for any sbp (obesity confounds fluid wave exam but prior 09/2022 ct abd pelv doesn't indicate ascites).    She takes rifaximin for PHE   ------------ 3/31 id assessment Doing well and still with right leg pain. Exam shows she has tenderness bilateral LE in setting of chronic edema  but it does reveal rle more tender; no obvious cellulitis changes present today (warmth/redness); no fluctuance  Sepsis and Aki had resolved  Stable diarrhea with lactulose use for her cirrhosis   Plan: Finish 7 day meropenem until 04/14/23 for the bacteremia; then change to cefadroxil 1000 mg po bid to finish 7 more days of cellulitis coverage Maintain contact precaution isolation Id will sign off Discussed with primary team        ------------------------------------------------ Principal Problem:   Sepsis (HCC) Active Problems:   Cirrhosis due to NASH    Thrombocytopenia (HCC) - Baseline 100-130K   Hypothyroidism   Chronic diarrhea   Short gut syndrome   Obesity, Class II, BMI 35-39.9   Abdominal wall hernia   Gram negative septicemia (HCC)   DNR (do not resuscitate)/DNI(Do Not intubate)   Hypomagnesemia   Leukoma of left eye   ARF (acute renal failure) (HCC)   RLS (restless legs syndrome)   Cellulitis of right thigh   Subjective: Improving -- at 70% baseline; said rle still huring than normal Stable diarrhea with lactulose Afebrile No rash      Family History  Problem Relation Age of Onset   Uterine cancer Mother    Bone cancer Father    Heart disease Father    Hyperlipidemia Father    Dementia Father    Hypertension Father    Skin cancer Sister    Hypertension Sister    Diabetes Sister    Hyperlipidemia Brother    Hypertension Brother    Heart disease Brother     Social History   Tobacco Use   Smoking status: Former    Current packs/day: 0.00    Types: Cigarettes    Quit date: 1987    Years since quitting: 38.2    Passive exposure: Past   Smokeless tobacco: Never  Vaping Use   Vaping status: Never Used  Substance Use Topics   Alcohol use: No   Drug use: No    Allergies  Allergen Reactions   Ace Inhibitors Anaphylaxis and Swelling   Ivp Dye [Iodinated Contrast Media] Anaphylaxis   Desitin [Zinc Oxide] Rash and Other (See Comments)  Worsens rash   Gadolinium Derivatives Other (See Comments)    Reaction??   Hydroxyzine Other (See Comments)    Affects the mind   Chlorhexidine Rash and Other (See Comments)    WORSENS RASHES   Doxycycline Rash   Penicillins Rash   Zofran [Ondansetron] Rash    Review of Systems: ROS All Other ROS was negative, except mentioned above   Past Medical History:  Diagnosis Date   Abdominal wall hernia 03/06/2020   Acute hepatic encephalopathy (HCC) 11/30/2022   Cerebral atherosclerosis 08/20/2020   Chronic diarrhea    Cirrhosis, non-alcoholic (HCC)  05/01/2022   pt stated on admission history review   Corneal perforation of left eye 04/18/2022   Depression, major, recurrent, moderate (HCC) 09/10/2018   DVT (deep venous thrombosis) (HCC) 01/10/1997   left      left     Heart murmur    Hepatic encephalopathy (HCC) 12/10/2022   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   Hyperammonemia (HCC) 09/30/2022   Hypertension    Kidney stone    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   Obesity    Pulmonary emboli (HCC) 01/10/2009   Pulmonary embolism (HCC)    Short gut syndrome    Thyroid disease        Scheduled Meds:  atorvastatin  40 mg Oral q AM   cyanocobalamin  2,000 mcg Oral Q Fri   famotidine  20 mg Oral q AM   ferrous sulfate  325 mg Oral Q breakfast   folic acid  1 mg Oral Daily   furosemide  40 mg Oral Daily   lactulose  20 g Oral TID   levothyroxine  175 mcg Oral Q0600   midodrine  10 mg Oral TID WC   nystatin   Topical TID   pantoprazole  40 mg Oral BID AC   rifaximin  550 mg Oral BID   rOPINIRole  0.25 mg Oral QHS   sodium bicarbonate  1,300 mg Oral BID   spironolactone  50 mg Oral Daily   venlafaxine XR  75 mg Oral Q breakfast   Continuous Infusions:  meropenem (MERREM) IV 1 g (04/10/23 1100)   PRN Meds:.acetaminophen, morphine injection, mouth rinse   OBJECTIVE: Blood pressure 108/61, pulse 72, temperature 98.1 F (36.7 C), temperature source Oral, resp. rate 20, height 5\' 2"  (1.575 m), weight 94.6 kg, SpO2 98%.  Physical Exam  General/constitutional: no distress, pleasant HEENT: Normocephalic, PER, Conj Clear, EOMI, Oropharynx clear Neck supple CV: rrr no mrg Lungs: clear to auscultation, normal respiratory effort Abd: Soft, Nontender Ext/msk/ext: rle no erythema/warmth/fluctuance. Bilateral tenderness distal lower extremities and slight tenderness right thigh    Lab Results Lab Results  Component Value Date   WBC 6.1 04/10/2023   HGB 9.1 (L) 04/10/2023   HCT  29.9 (L) 04/10/2023   MCV 96.1 04/10/2023   PLT 108 (L) 04/10/2023    Lab Results  Component Value Date   CREATININE 0.83 04/10/2023   BUN 19 04/10/2023   NA 138 04/10/2023   K 3.9 04/10/2023   CL 112 (H) 04/10/2023   CO2 21 (L) 04/10/2023    Lab Results  Component Value Date   ALT 28 04/09/2023   AST 43 (H) 04/09/2023   ALKPHOS 59 04/09/2023   BILITOT 1.3 (H) 04/09/2023      Microbiology: Recent Results (from the past 240 hours)  Culture, blood (Routine x 2)     Status: Abnormal   Collection Time: 04/05/23  11:24 PM   Specimen: BLOOD  Result Value Ref Range Status   Specimen Description   Final    BLOOD LEFT ANTECUBITAL Performed at [facility], [address], [place], [place] [address]    Special Requests   Final    BOTTLES DRAWN AEROBIC AND ANAEROBIC Blood Culture adequate volume Performed at [facility], [address], [place], [place] [address]    Culture  Setup Time   Final    GRAM NEGATIVE RODS BOTTLES DRAWN AEROBIC ONLY CRITICAL RESULT CALLED TO, READ BACK BY AND VERIFIED WITH: Abe People [id number] @1955  FH Performed at [facility], [address], [place], [place] [address]    Culture FLAVOBACTERIUM ODORATUM (A)  Final   Report Status 04/08/2023 FINAL  Final   Organism ID, Bacteria FLAVOBACTERIUM ODORATUM  Final      Susceptibility   Flavobacterium odoratum - MIC*    CEFTAZIDIME >=64 RESISTANT Resistant     CIPROFLOXACIN >=4 RESISTANT Resistant     GENTAMICIN >=16 RESISTANT Resistant     IMIPENEM 2 SENSITIVE Sensitive     TRIMETH/SULFA >=320 RESISTANT Resistant     PIP/TAZO >=128 RESISTANT Resistant ug/mL    * FLAVOBACTERIUM ODORATUM  Blood Culture ID Panel (Reflexed)     Status: None   Collection Time: 04/05/23 11:24 PM  Result Value Ref Range Status   Enterococcus faecalis NOT DETECTED NOT DETECTED Final   Enterococcus Faecium NOT DETECTED NOT DETECTED Final   Listeria monocytogenes NOT DETECTED  NOT DETECTED Final   Staphylococcus species NOT DETECTED NOT DETECTED Final   Staphylococcus aureus (BCID) NOT DETECTED NOT DETECTED Final   Staphylococcus epidermidis NOT DETECTED NOT DETECTED Final   Staphylococcus lugdunensis NOT DETECTED NOT DETECTED Final   Streptococcus species NOT DETECTED NOT DETECTED Final   Streptococcus agalactiae NOT DETECTED NOT DETECTED Final   Streptococcus pneumoniae NOT DETECTED NOT DETECTED Final   Streptococcus pyogenes NOT DETECTED NOT DETECTED Final   A.calcoaceticus-baumannii NOT DETECTED NOT DETECTED Final   Bacteroides fragilis NOT DETECTED NOT DETECTED Final   Enterobacterales NOT DETECTED NOT DETECTED Final   Enterobacter cloacae complex NOT DETECTED NOT DETECTED Final   Escherichia coli NOT DETECTED NOT DETECTED Final   Klebsiella aerogenes NOT DETECTED NOT DETECTED Final   Klebsiella oxytoca NOT DETECTED NOT DETECTED Final   Klebsiella pneumoniae NOT DETECTED NOT DETECTED Final   Proteus species NOT DETECTED NOT DETECTED Final   Salmonella species NOT DETECTED NOT DETECTED Final   Serratia marcescens NOT DETECTED NOT DETECTED Final   Haemophilus influenzae NOT DETECTED NOT DETECTED Final   Neisseria meningitidis NOT DETECTED NOT DETECTED Final   Pseudomonas aeruginosa NOT DETECTED NOT DETECTED Final   Stenotrophomonas maltophilia NOT DETECTED NOT DETECTED Final   Candida albicans NOT DETECTED NOT DETECTED Final   Candida auris NOT DETECTED NOT DETECTED Final   Candida glabrata NOT DETECTED NOT DETECTED Final   Candida krusei NOT DETECTED NOT DETECTED Final   Candida parapsilosis NOT DETECTED NOT DETECTED Final   Candida tropicalis NOT DETECTED NOT DETECTED Final   Cryptococcus neoformans/gattii NOT DETECTED NOT DETECTED Final    Comment: Performed at [facility], [address], [place], [place] [address]  Culture, blood (Routine x 2)     Status: Abnormal   Collection Time: 04/06/23 12:03 AM   Specimen: BLOOD LEFT FOREARM  Result  Value Ref Range Status   Specimen Description   Final    BLOOD LEFT FOREARM Performed at [facility], [address], [place], [place] [address]    Special Requests   Final    BOTTLES DRAWN AEROBIC AND ANAEROBIC Blood Culture results may not be optimal due to an inadequate volume of blood received in culture bottles Performed at [facility], [address], [place], [place] [address]    Culture  Setup Time   Final    GRAM NEGATIVE RODS BOTTLES DRAWN AEROBIC ONLY CRITICAL VALUE NOTED.  VALUE IS CONSISTENT WITH PREVIOUSLY REPORTED AND CALLED VALUE.    Culture (A)  Final    FLAVOBACTERIUM ODORATUM SUSCEPTIBILITIES PERFORMED ON PREVIOUS CULTURE WITHIN THE LAST 5 DAYS. Performed at [facility], [address], [place], [place] [address]    Report Status 04/08/2023 FINAL  Final     Serology:    Imaging: If present, new imagings (plain films, ct scans, and mri) have been personally visualized and interpreted; radiology reports have been reviewed. Decision making incorporated into the Impression / Recommendations.  3/27 cxr Shallow inspiration. No evidence of active pulmonary disease.   3/27 ct femur rle 1. Diffuse fat stranding of the subcutaneous lateral and medial right thigh, extending to the superficial fascia of the underlying anterior and posterior compartment musculature, concerning for cellulitis. These findings are similar to slightly increased compared to the prior exam. No discrete loculated fluid collection identified. No soft tissue gas. 2. No acute osseous abnormality.  Raymondo Band, MD [facility] [phone] pager    04/10/2023, 11:57 AM

## 2023-04-11 DIAGNOSIS — L03115 Cellulitis of right lower limb: Secondary | ICD-10-CM | POA: Diagnosis not present

## 2023-04-11 DIAGNOSIS — K7460 Unspecified cirrhosis of liver: Secondary | ICD-10-CM | POA: Diagnosis not present

## 2023-04-11 DIAGNOSIS — A4150 Gram-negative sepsis, unspecified: Secondary | ICD-10-CM | POA: Diagnosis not present

## 2023-04-11 DIAGNOSIS — K529 Noninfective gastroenteritis and colitis, unspecified: Secondary | ICD-10-CM | POA: Diagnosis not present

## 2023-04-11 MED ORDER — HYDROCORTISONE 0.5 % EX CREA
TOPICAL_CREAM | Freq: Three times a day (TID) | CUTANEOUS | Status: DC | PRN
  Filled 2023-04-11: qty 28.35

## 2023-04-11 MED ORDER — CEFADROXIL 500 MG PO CAPS
1000.0000 mg | ORAL_CAPSULE | Freq: Two times a day (BID) | ORAL | Status: DC
Start: 2023-04-14 — End: 2023-04-11

## 2023-04-11 NOTE — Progress Notes (Signed)
 Mobility Specialist - Progress Note   04/11/23 1326  Mobility  Activity Ambulated with assistance in hallway;Transferred to/from [facility]  Level of Assistance Modified independent, requires aide device or extra time  Assistive Device Front wheel walker  Distance Ambulated (ft) 160 ft  Activity Response Tolerated well  Mobility Referral Yes  Mobility visit 1 Mobility  Mobility Specialist Start Time (ACUTE ONLY) 1311  Mobility Specialist Stop Time (ACUTE ONLY) 1325  Mobility Specialist Time Calculation (min) (ACUTE ONLY) 14 min   Pt received in bed and agreeable to mobility. Prior to ambulating, pt requested assistance to [facility]. No complaints during session. Pt to bed after session with all needs met. Bed alarm on.  [facility]

## 2023-04-11 NOTE — Progress Notes (Signed)
 PROGRESS NOTE    [name]  GNF:[id number] DOB: 1955/07/29 DOA: 04/05/2023 PCP: [name], MD  Subjective: Pt seen and examined. Doing well. Continues to walk with PT. Feeling well. Wants to know when she can be discharged from hospital.   Hospital Course: HPI:  [name] is a 68 y.o. female with history of liver cirrhosis secondary to [facility], hypothyroidism, hyperlipidemia, GERD, chronic anemia and thrombocytopenia recently admitted to the hospital for hepatic encephalopathy discharged on 04/03/2023 was noticed by patient's son that patient was getting confused and was complaining of pain in the right thigh area.  As per the patient's son patient had a small scratch on the right thigh is not sure if patient herself scratched on the right thigh or the cat scratch at around noontime yesterday.  By evening patient's right thigh became more erythematous and painful and patient also became confused was brought to the ER.  Per patient's son patient has been taking her medications as advised.  She was doing fine until this happened.  Denies any abdominal pain nausea vomiting or diarrhea or chest pain productive cough.   ED Course: In the ER patient was tachycardic with a temperature of 103 F.  On exam patient has a right thigh erythema.  Cultures were obtained started on empiric antibiotics.  Chest x-ray is unremarkable.  Labs show mildly elevated creatinine.  Patient was given sepsis protocol fluid bolus and started on antibiotics.  Lactic acid was elevated.  Significant Events: Admitted 04/05/2023 for right thigh cellulitis   Significant Labs: Na 136, K 3.8, CO2 of 15, BUN 18, Scr 1.12, glu 111 WBC 5.2, HgB 11.7, Plt 119 Lactic acid 3.3 04-05-2023 GNR 2/2 bottle(aerobic) for GNR  Significant Imaging Studies: CXR Shallow inspiration. No evidence of active pulmonary disease  CT right femur Diffuse fat stranding of the subcutaneous lateral and medial right thigh, extending to the  superficial fascia of the underlying anterior and posterior compartment musculature, concerning for cellulitis. These findings are similar to slightly increased  compared to the prior exam. No discrete loculated fluid collection identified. No soft tissue gas. 2. No acute osseous abnormality.  Antibiotic Therapy: Anti-infectives (From admission, onward)    Start     Dose/Rate Route Frequency Ordered Stop   04/07/23 1000  vancomycin (VANCOREADY) IVPB 1250 mg/250 mL        1,250 mg 166.7 mL/hr over 90 Minutes Intravenous Every 36 hours 04/06/23 0537     04/07/23 1000  azithromycin (ZITHROMAX) tablet 500 mg        500 mg Oral Daily 04/07/23 0719     04/06/23 2200  azithromycin (ZITHROMAX) 500 mg in sodium chloride 0.9 % 250 mL IVPB  Status:  Discontinued        500 mg 250 mL/hr over 60 Minutes Intravenous Every 24 hours 04/06/23 2033 04/07/23 0719   04/06/23 2200  ceFEPIme (MAXIPIME) 2 g in sodium chloride 0.9 % 100 mL IVPB        2 g 200 mL/hr over 30 Minutes Intravenous Every 12 hours 04/06/23 2033     04/06/23 1400  metroNIDAZOLE (FLAGYL) IVPB 500 mg  Status:  Discontinued        500 mg 100 mL/hr over 60 Minutes Intravenous Every 12 hours 04/06/23 0527 04/06/23 2032   04/06/23 1200  cefTRIAXone (ROCEPHIN) 2 g in sodium chloride 0.9 % 100 mL IVPB  Status:  Discontinued        2 g 200 mL/hr over 30 Minutes Intravenous Every 24 hours  04/06/23 0526 04/06/23 2032   04/06/23 1000  rifaximin (XIFAXAN) tablet 550 mg        550 mg Oral 2 times daily 04/06/23 0525     04/06/23 0100  ceFEPIme (MAXIPIME) 2 g in sodium chloride 0.9 % 100 mL IVPB        2 g 200 mL/hr over 30 Minutes Intravenous  Once 04/06/23 0048 04/06/23 0146   04/06/23 0100  metroNIDAZOLE (FLAGYL) IVPB 500 mg        500 mg 100 mL/hr over 60 Minutes Intravenous  Once 04/06/23 0048 04/06/23 0317   04/06/23 0100  vancomycin (VANCOCIN) IVPB 1000 mg/200 mL premix        1,000 mg 200 mL/hr over 60 Minutes Intravenous  Once 04/06/23  0048 04/06/23 0421       Procedures:   Consultants:     Assessment and Plan: * Sepsis (HCC) 04-07-2023 present on admission with AKI. Lactic acid 3.3. right thigh cellulitis. On IV ABX with meropenem. Prior hx of ESBL pseudomonas septicemia in 09-2022  Cellulitis of right lower extremity 04-07-2023 On IV ABX with meropenem. Prior hx of ESBL pseudomonas septicemia in 09-2022. Not much erythema right inner medial thigh. Tender to touch.  04-08-2023 blood cx growing FLAVOBACTERIUM ODORATUM. On IV meropenem due to resistance to cipro/zosyn. Will wait for ID to return on Monday to decide on length of treatment course. Right inner thigh without much erythema. Pt is developing some weeping edema. Will need to restart lasix.  04-09-2023 lasix restarted yesterday. Restart aldactone today. Scr back to normal. Still with some weeping on right outer thigh. Right inner medial thigh is much less indurated. Minimal erythema.  04-10-2023 only slightly tender in most distant portion of her right inner thigh pannus. Nearly all the erythema in right thigh pannus has resolved.  04-11-2023 improving tenderness right distal medial inner thigh pannus. On IV meropenem. Last day of IV meropenem is 04-14-2023. DC to home with 7 days of duricef 1000 mg bid.  Gram negative septicemia (HCC) 04-07-2023 blood cx growing GNR. ID consulted. On IV ABX with meropenem. Prior hx of ESBL pseudomonas septicemia in 09-2022  04-08-2023 blood cx growing FLAVOBACTERIUM ODORATUM. On IV meropenem due to resistance to cipro/zosyn. Will wait for ID to return on Monday to decide on length of treatment course.  04-09-2023 continue with IV meropenem.  Day # 3. Awaiting ID to give IV abx duration recommendations.  04-10-2023 Day #4 Meropenem. Awaiting ID f/u to determine duration of treatment. Due to right thigh cellulitis.  04-11-2023 Day #5 Meropenem. ID recs are to complete 7 days total of IV meropenem. Last day of IV AXB will be on  04-14-2023 then DC to home cefadroxil 1000 mg po bid to finish 7 more days   ARF (acute renal failure) (HCC) 04-07-2023 Scr is usually normal. Scr 1.3  04-08-2023 will need to restart po lasix due to weeping edema of her thighs  04-09-2023 Scr back to normal 0.91 today. Back on lasix. Restart aldcatone due to edema of thighs.  04-10-2023  AKI/ARF has resolved. Scr 0.83 after restarting lasix and aldactone yesterday.  RLS (restless legs syndrome) 04-07-2023 chronic   04-08-2023 chronic. Stable  04-09-2023 chronic. Stable  04-10-2023 stable  04-11-2023 chronic. stable.  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 Stable  04-09-2023 chronic. Stable  04-10-2023 stable  04-11-2023 chronic. stable.  Hypomagnesemia 04-07-2023 replete with IV Mg  04-08-2023 resolved.  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 Stable  04-09-2023 chronic. Stable  04-10-2023 stable  04-11-2023 chronic. stable.  Obesity, Class II, BMI 35-39.9 Body mass index is 38.15 kg/m.   Short gut syndrome 04-07-2023 chronic. Has chronic diarrhea  04-08-2023 chronic. Stable  04-09-2023 chronic. Stable  04-10-2023 stable  04-11-2023 chronic. stable.  Chronic diarrhea 04-07-2023 chronic   04-08-2023 chronic. Stable  04-09-2023 chronic. Stable  04-10-2023 stable  04-11-2023 chronic. stable. Due to short gut syndrome and chronic lactulose therapy to prevent hepatic encephalopathy  Hypothyroidism 04-07-2023 chronic   04-08-2023 chronic. Stable  04-09-2023 chronic. Stable.   04-10-2023 stable  04-11-2023 chronic. stable. Continue synthroid 175 mcg daily.  Thrombocytopenia (HCC) - Baseline 100-130K 04-07-2023 chronic. Due to her cirrhosis. Baseline 100-130K. Stop heparin  04-09-2023 repeat CBC in AM to check plt. Last dose of SQ heparin was 04-07-2023 @ 1335  04-10-2023  stable. Plt count 108K today.  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 Hx of repeated bouts of hepatic encephalopathy. Continue with Xifaxan and lactulose.  04-08-2023 stable. Continue xifaxan and lactulose.  04-09-2023 stable. Continue xifaxan and lactulose.  04-10-2023 stable  04-11-2023 chronic. stable. Continue xifaxan and lactulose.   DVT prophylaxis: SCD    Code Status: Limited: Do not attempt resuscitation (DNR) -DNR-LIMITED -Do Not Intubate/DNI  Family Communication: no family at bedside Disposition Plan: return home Reason for continuing need for hospitalization: remains on IV ABX  Objective: Vitals:   04/10/23 1330 04/10/23 2033 04/11/23 0257 04/11/23 1156  BP: (!) 114/53 132/66 (!) 151/78 (!) 110/58  Pulse: 71 64 73 72  Resp: 18 18 20 18   Temp: 98.1 F (36.7 C) 97.9 F (36.6 C) 98.7 F (37.1 C) 98.6 F (37 C)  TempSrc: Oral Oral Oral Oral  SpO2: 100% 100% 98% 97%  Weight:      Height:        Intake/Output Summary (Last 24 hours) at 04/11/2023 1329 Last data filed at 04/11/2023 1032 Gross per 24 hour  Intake 1280 ml  Output --  Net 1280 ml   Filed Weights   04/05/23 2301  Weight: 94.6 kg    Examination:  Physical Exam Vitals and nursing note reviewed.  Constitutional:      Appearance: She is obese.  HENT:     Head: Normocephalic and atraumatic.     Nose: Nose normal.  Eyes:     Comments: Left eye leukoma  Cardiovascular:     Rate and Rhythm: Normal rate and regular rhythm.     Pulses: Normal pulses.     Heart sounds: Murmur heard.     Comments: SEM Abdominal:     General: Bowel sounds are normal. There is no distension.     Palpations: Abdomen is soft.  Skin:    General: Skin is warm and dry.     Capillary Refill: Capillary refill takes less than 2 seconds.     Comments: Minimal tenderness distal right inner thigh pannus(closest to her knee). No/minimal erythema.  Neurological:     General: No focal deficit present.     Mental Status:  She is alert and oriented to person, place, and time.     Data Reviewed: I have personally reviewed following labs and imaging studies  CBC: Recent Labs  Lab 04/05/23 2324 04/06/23 0632 04/08/23 0431 04/10/23 0327  WBC 5.7 8.7 10.5 6.1  NEUTROABS 5.1 7.5 7.6 3.2  HGB 11.7* 9.4* 8.5* 9.1*  HCT 37.0 30.3* 28.7* 29.9*  MCV 93.9 94.4 98.0 96.1  PLT 119* 81* 109* 108*   Basic Metabolic Panel: Recent Labs  Lab 04/06/23 0632 04/07/23 0335 04/08/23 0431 04/09/23 0347 04/10/23 0327  NA 133* 134* 136 139 138  K 3.3* 3.8 3.4* 4.0 3.9  CL 110 111 113* 116* 112*  CO2 16* 16* 17* 20* 21*  GLUCOSE 120* 88 134* 108* 78  BUN 19 24* 27* 24* 19  CREATININE 1.23* 1.30* 1.28* 0.91 0.83  CALCIUM 8.1* 7.6* 7.7* 7.8* 7.9*  MG  --  1.5* 2.0 1.9  --    GFR: Estimated Creatinine Clearance: 72.4 mL/min (by C-G formula based on SCr of 0.83 mg/dL). Liver Function Tests: Recent Labs  Lab 04/05/23 2324 04/06/23 7829 04/07/23 0335 04/08/23 0431 04/09/23 0347  AST 40 47* 55* 49* 43*  ALT 21 20 24 26 28   ALKPHOS 94 60 48 61 59  BILITOT 2.5* 2.1* 1.5* 1.4* 1.3*  PROT 6.3* 4.8* 4.7* 4.5* 4.6*  ALBUMIN 3.0* 2.3* 2.1* 2.0* 2.1*    Recent Labs  Lab 04/06/23 0116  AMMONIA 46*   Coagulation Profile: Recent Labs  Lab 04/06/23 0003  INR 1.8*   Sepsis Labs: Recent Labs  Lab 04/06/23 0030 04/07/23 0335  LATICACIDVEN 3.3* 1.3    Recent Results (from the past 240 hours)  Culture, blood (Routine x 2)     Status: Abnormal   Collection Time: 04/05/23 11:24 PM   Specimen: BLOOD  Result Value Ref Range Status   Specimen Description   Final    BLOOD LEFT ANTECUBITAL Performed at [facility], [address], [place], [place] [address]    Special Requests   Final    BOTTLES DRAWN AEROBIC AND ANAEROBIC Blood Culture adequate volume Performed at [facility], [address], [place], [place] [address]    Culture  Setup Time   Final    GRAM  NEGATIVE RODS BOTTLES DRAWN AEROBIC ONLY CRITICAL RESULT CALLED TO, READ BACK BY AND VERIFIED WITH: Abe People [id number] @1955  FH Performed at [facility], [address], [place], [place] [address]    Culture FLAVOBACTERIUM ODORATUM (A)  Final   Report Status 04/08/2023 FINAL  Final   Organism ID, Bacteria FLAVOBACTERIUM ODORATUM  Final      Susceptibility   Flavobacterium odoratum - MIC*    CEFTAZIDIME >=64 RESISTANT Resistant     CIPROFLOXACIN >=4 RESISTANT Resistant     GENTAMICIN >=16 RESISTANT Resistant     IMIPENEM 2 SENSITIVE Sensitive     TRIMETH/SULFA >=320 RESISTANT Resistant     PIP/TAZO >=128 RESISTANT Resistant ug/mL    * FLAVOBACTERIUM ODORATUM  Blood Culture ID Panel (Reflexed)     Status: None   Collection Time: 04/05/23 11:24 PM  Result Value Ref Range Status   Enterococcus faecalis NOT DETECTED NOT DETECTED Final   Enterococcus Faecium NOT DETECTED NOT DETECTED Final   Listeria monocytogenes NOT DETECTED NOT DETECTED Final   Staphylococcus species NOT DETECTED NOT DETECTED Final   Staphylococcus aureus (BCID) NOT DETECTED NOT DETECTED Final   Staphylococcus epidermidis NOT DETECTED NOT DETECTED Final   Staphylococcus lugdunensis NOT DETECTED NOT DETECTED Final   Streptococcus species NOT DETECTED NOT DETECTED Final   Streptococcus agalactiae NOT DETECTED NOT DETECTED Final   Streptococcus pneumoniae NOT DETECTED NOT DETECTED Final   Streptococcus pyogenes NOT DETECTED NOT DETECTED Final   A.calcoaceticus-baumannii NOT DETECTED NOT DETECTED Final   Bacteroides fragilis NOT DETECTED NOT DETECTED Final   Enterobacterales NOT DETECTED NOT DETECTED Final  Enterobacter cloacae complex NOT DETECTED NOT DETECTED Final   Escherichia coli NOT DETECTED NOT DETECTED Final   Klebsiella aerogenes NOT DETECTED NOT DETECTED Final   Klebsiella oxytoca NOT DETECTED NOT DETECTED Final   Klebsiella pneumoniae NOT DETECTED NOT DETECTED Final   Proteus species NOT  DETECTED NOT DETECTED Final   Salmonella species NOT DETECTED NOT DETECTED Final   Serratia marcescens NOT DETECTED NOT DETECTED Final   Haemophilus influenzae NOT DETECTED NOT DETECTED Final   Neisseria meningitidis NOT DETECTED NOT DETECTED Final   Pseudomonas aeruginosa NOT DETECTED NOT DETECTED Final   Stenotrophomonas maltophilia NOT DETECTED NOT DETECTED Final   Candida albicans NOT DETECTED NOT DETECTED Final   Candida auris NOT DETECTED NOT DETECTED Final   Candida glabrata NOT DETECTED NOT DETECTED Final   Candida krusei NOT DETECTED NOT DETECTED Final   Candida parapsilosis NOT DETECTED NOT DETECTED Final   Candida tropicalis NOT DETECTED NOT DETECTED Final   Cryptococcus neoformans/gattii NOT DETECTED NOT DETECTED Final    Comment: Performed at [facility], [name], [place], [place] [address]  Culture, blood (Routine x 2)     Status: Abnormal   Collection Time: 04/06/23 12:03 AM   Specimen: BLOOD LEFT FOREARM  Result Value Ref Range Status   Specimen Description   Final    BLOOD LEFT FOREARM Performed at [facility], [address], [place], [place] [address]    Special Requests   Final    BOTTLES DRAWN AEROBIC AND ANAEROBIC Blood Culture results may not be optimal due to an inadequate volume of blood received in culture bottles Performed at [facility], [address] [place], [place] [address]    Culture  Setup Time   Final    GRAM NEGATIVE RODS BOTTLES DRAWN AEROBIC ONLY CRITICAL VALUE NOTED.  VALUE IS CONSISTENT WITH PREVIOUSLY REPORTED AND CALLED VALUE.    Culture (A)  Final    FLAVOBACTERIUM ODORATUM SUSCEPTIBILITIES PERFORMED ON PREVIOUS CULTURE WITHIN THE LAST 5 DAYS. Performed at [facility], [address] [place], [place] [address]    Report Status 04/08/2023 FINAL  Final     Radiology Studies: No results found.  Scheduled Meds:  atorvastatin  40 mg Oral q AM   [START ON 04/14/2023]  cefadroxil  1,000 mg Oral BID   cyanocobalamin  2,000 mcg Oral Q Fri   famotidine  20 mg Oral q AM   ferrous sulfate  325 mg Oral Q breakfast   folic acid  1 mg Oral Daily   furosemide  40 mg Oral Daily   lactulose  20 g Oral TID   levothyroxine  175 mcg Oral Q0600   midodrine  10 mg Oral TID WC   nystatin   Topical TID   pantoprazole  40 mg Oral BID AC   rifaximin  550 mg Oral BID   rOPINIRole  0.25 mg Oral QHS   sodium bicarbonate  1,300 mg Oral BID   spironolactone  50 mg Oral Daily   venlafaxine XR  75 mg Oral Q breakfast   Continuous Infusions:  meropenem (MERREM) IV 1 g (04/11/23 0257)     LOS: 5 days   Time spent: 40 minutes  Carollee Herter, DO  [facility]  04/11/2023, 1:29 PM

## 2023-04-12 DIAGNOSIS — R6520 Severe sepsis without septic shock: Secondary | ICD-10-CM | POA: Diagnosis not present

## 2023-04-12 DIAGNOSIS — A419 Sepsis, unspecified organism: Secondary | ICD-10-CM | POA: Diagnosis not present

## 2023-04-12 DIAGNOSIS — N179 Acute kidney failure, unspecified: Secondary | ICD-10-CM | POA: Diagnosis not present

## 2023-04-12 LAB — CBC WITH DIFFERENTIAL/PLATELET
Abs Immature Granulocytes: 0.04 10*3/uL (ref 0.00–0.07)
Basophils Absolute: 0.1 10*3/uL (ref 0.0–0.1)
Basophils Relative: 1 %
Eosinophils Absolute: 0.6 10*3/uL — ABNORMAL HIGH (ref 0.0–0.5)
Eosinophils Relative: 8 %
HCT: 30.6 % — ABNORMAL LOW (ref 36.0–46.0)
Hemoglobin: 9.4 g/dL — ABNORMAL LOW (ref 12.0–15.0)
Immature Granulocytes: 1 %
Lymphocytes Relative: 27 %
Lymphs Abs: 1.9 10*3/uL (ref 0.7–4.0)
MCH: 28.7 pg (ref 26.0–34.0)
MCHC: 30.7 g/dL (ref 30.0–36.0)
MCV: 93.3 fL (ref 80.0–100.0)
Monocytes Absolute: 1 10*3/uL (ref 0.1–1.0)
Monocytes Relative: 14 %
Neutro Abs: 3.4 10*3/uL (ref 1.7–7.7)
Neutrophils Relative %: 49 %
Platelets: 125 10*3/uL — ABNORMAL LOW (ref 150–400)
RBC: 3.28 MIL/uL — ABNORMAL LOW (ref 3.87–5.11)
RDW: 16.7 % — ABNORMAL HIGH (ref 11.5–15.5)
WBC: 6.9 10*3/uL (ref 4.0–10.5)
nRBC: 0 % (ref 0.0–0.2)

## 2023-04-12 LAB — BASIC METABOLIC PANEL WITH GFR
Anion gap: 3 — ABNORMAL LOW (ref 5–15)
BUN: 20 mg/dL (ref 8–23)
CO2: 23 mmol/L (ref 22–32)
Calcium: 7.7 mg/dL — ABNORMAL LOW (ref 8.9–10.3)
Chloride: 113 mmol/L — ABNORMAL HIGH (ref 98–111)
Creatinine, Ser: 0.83 mg/dL (ref 0.44–1.00)
GFR, Estimated: >60 mL/min (ref >60)
Glucose, Bld: 120 mg/dL — ABNORMAL HIGH (ref 70–99)
Potassium: 3.5 mmol/L (ref 3.5–5.1)
Sodium: 139 mmol/L (ref 135–145)

## 2023-04-12 MED ORDER — COPPER 2 MG PO TABS
2.0000 mg | Freq: Two times a day (BID) | Status: DC
  Administered 2023-04-12 – 2023-04-14 (×5): 2 mg via ORAL
  Filled 2023-04-12 (×5): qty 1

## 2023-04-12 NOTE — Plan of Care (Signed)

## 2023-04-12 NOTE — Progress Notes (Signed)
 Mobility Specialist - Progress Note   04/12/23 1101  Mobility  Activity Ambulated with assistance in hallway  Level of Assistance Standby assist, set-up cues, supervision of patient - no hands on  Distance Ambulated (ft) 160 ft  Activity Response Tolerated well  Mobility Referral Yes  Mobility visit 1 Mobility  Mobility Specialist Start Time (ACUTE ONLY) 1039  Mobility Specialist Stop Time (ACUTE ONLY) 1100  Mobility Specialist Time Calculation (min) (ACUTE ONLY) 21 min   Pt received in bed and agreeable to mobility. No complaints during session. Pt to recliner after session with all needs met.    [facility]

## 2023-04-12 NOTE — Progress Notes (Signed)
 PROGRESS NOTE [name]  WGN:[id number] DOB: 1955-05-19 DOA: 04/05/2023 PCP: [name], MD  Brief Narrative/Hospital Course: 68 y.o. female with history of liver cirrhosis secondary to [facility], hypothyroidism, hyperlipidemia, GERD, chronic anemia and thrombocytopenia recently admitted to the hospital for hepatic encephalopathy discharged on 04/03/2023 was noticed by patient's son that patient was getting confused and was complaining of pain in the right thigh area , she had a small scratch on the right thigh and her thigh became more erythematous and painful and patient also became confused was brought to the ER and found to be tachycardic febrile and admitted for right thigh cellulitis.  Significant procedures/testing: CXR Shallow inspiration. No evidence of active pulmonary disease  CT right femur Diffuse fat stranding of the subcutaneous lateral and medial right thigh, extending to the superficial fascia of the underlying anterior and posterior compartment musculature, concerning for cellulitis. These findings are similar to slightly increased  compared to the prior exam. No discrete loculated fluid collection identified. No soft tissue gas. 2. No acute osseous abnormality.  Subjective: Seen and examined Son at bedside Overnight afebrile BP 90s-110.  Labs showing stable CBC, BMP No complaints. Aaox3, no asterixis Had BM x4 already  Assessment and plan:  Sepsis POA RLE cellulitis Flavobacterium bacteremia: Patient presented with sepsis in the setting of right thigh cellulitis, and bacteremia with her immunocompromise status from liver cirrhosis. ID following on IV meropenem: EOT/4/25 for bacteremia on discharge changed to cefadroxil to finish 7 more days for cellulitis. Continue contact precaution.  ID signed off  Liver cirrhosis due to NASH Repeated episodes of acute encephalopathy Coagulopathy with elevated INR from cirrhosis: Continued chronic rifaximin, lactulose.  Monitor  mental status asterixis.  Continue her Lasix, Aldactone, PPI, sodium bicarb Pepcid B12 supplement  Chronic thrombocytopenia due to liver cirrhosis: Baseline 100-130. Monitor  AKI: Resolved.  Monitor  RLS: Chronic and stable.  Left eye leukoma: Supportive care  Hypomagnesemia: Improved  Abdominal wall hernia: Chronic.  Short gut syndrome Chronic diarrhea: Continue supportive care.  Lactulose.  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   DVT prophylaxis:  Code Status:   Code Status: Limited: Do not attempt resuscitation (DNR) -DNR-LIMITED -Do Not Intubate/DNI  Family Communication: plan of care discussed with patient and her son  at bedside. Patient status is: Remains hospitalized because of severity of illness Level of care: Progressive   Dispo: The patient is from: HOME.  Patient doing well on ambulation with PT            Anticipated disposition: TBD  Objective: Vitals last 24 hrs: Vitals:   04/11/23 2010 04/11/23 2010 04/12/23 0427 04/12/23 0523  BP: 138/64 138/64 (!) 98/41 (!) 110/57  Pulse: 78 78 71 72  Resp: 18 18 16    Temp: 98.4 F (36.9 C) 98.4 F (36.9 C) 98.1 F (36.7 C)   TempSrc: Oral Oral    SpO2: 100% 100% 98%   Weight:      Height:       Weight change:   Physical Examination: General exam: alert awake, older than stated age HEENT:Oral mucosa moist, Ear/Nose WNL grossly Respiratory system: Bilaterally diminished BS, no use of accessory muscle Cardiovascular system: S1 & S2 +. Gastrointestinal system: Abdomen soft, NT,ND,BS+ Nervous System: Alert, awake,following commands. Extremities: LE edema chronic appearing swelling in the thigh,moving arms, warm legs  Skin: No rashes,warm. MSK: Normal muscle bulk/tone.   Medications reviewed:  Scheduled Meds:  atorvastatin  40 mg Oral  q AM   [START ON 04/14/2023] cefadroxil  1,000 mg Oral BID   copper  2 mg Oral BID   cyanocobalamin  2,000 mcg Oral Q Fri   famotidine  20 mg Oral q AM   ferrous sulfate  325 mg Oral Q breakfast   folic acid  1 mg Oral Daily   furosemide  40 mg Oral Daily   lactulose  20 g Oral TID   levothyroxine  175 mcg Oral Q0600   midodrine  10 mg Oral TID WC   nystatin   Topical TID   pantoprazole  40 mg Oral BID AC   rifaximin  550 mg Oral BID   rOPINIRole  0.25 mg Oral QHS   sodium bicarbonate  1,300 mg Oral BID   spironolactone  50 mg Oral Daily   venlafaxine XR  75 mg Oral Q breakfast   Continuous Infusions:  meropenem (MERREM) IV 1 g (04/12/23 0622)    Diet Order             Diet Heart Room service appropriate? Yes; Fluid consistency: Thin  Diet effective now                  Intake/Output Summary (Last 24 hours) at 04/12/2023 1149 Last data filed at 04/12/2023 0409 Gross per 24 hour  Intake 977 ml  Output --  Net 977 ml   Net IO Since Admission: 4,360.21 mL [04/12/23 1149]  Wt Readings from Last 3 Encounters:  04/05/23 94.6 kg  03/29/23 94.6 kg  03/08/23 94.6 kg     Unresulted Labs (From admission, onward)     Start     Ordered   04/13/23 0500  Basic metabolic panel with GFR  Daily,   R      04/12/23 0909   04/06/23 0524  CBC  (heparin)  Once,   R       Comments: Baseline for heparin therapy IF NOT ALREADY DRAWN.  Notify MD if PLT < 100 K.    04/06/23 0525          Data Reviewed: I have personally reviewed following labs and imaging studies ( see epic result tab) CBC: Recent Labs  Lab 04/05/23 2324 04/06/23 0632 04/08/23 0431 04/10/23 0327 04/12/23 0406  WBC 5.7 8.7 10.5 6.1 6.9  NEUTROABS 5.1 7.5 7.6 3.2 3.4  HGB 11.7* 9.4* 8.5* 9.1* 9.4*  HCT 37.0 30.3* 28.7* 29.9* 30.6*  MCV 93.9 94.4 98.0 96.1 93.3  PLT 119* 81* 109* 108* 125*   CMP: Recent Labs  Lab 04/07/23 0335 04/08/23 0431 04/09/23 0347 04/10/23 0327 04/12/23 0406  NA 134* 136 139 138  139  K 3.8 3.4* 4.0 3.9 3.5  CL 111 113* 116* 112* 113*  CO2 16* 17* 20* 21* 23  GLUCOSE 88 134* 108* 78 120*  BUN 24* 27* 24* 19 20  CREATININE 1.30* 1.28* 0.91 0.83 0.83  CALCIUM 7.6* 7.7* 7.8* 7.9* 7.7*  MG 1.5* 2.0 1.9  --   --    GFR: Estimated Creatinine Clearance: 72.4 mL/min (by C-G formula based on SCr of 0.83 mg/dL). Recent Labs  Lab 04/05/23 2324 04/06/23 0632 04/07/23 0335 04/08/23 0431 04/09/23 0347  AST 40 47* 55* 49* 43*  ALT 21 20 24 26 28   ALKPHOS 94 60 48 61 59  BILITOT 2.5* 2.1* 1.5* 1.4* 1.3*  PROT 6.3* 4.8*  4.7* 4.5* 4.6*  ALBUMIN 3.0* 2.3* 2.1* 2.0* 2.1*   No results for input(s): "LIPASE", "AMYLASE" in the last 168 hours.  Recent Labs  Lab 04/06/23 0116  AMMONIA 46*   Coagulation Profile:  Recent Labs  Lab 04/06/23 0003  INR 1.8*  Sepsis Labs: Recent Labs  Lab 04/06/23 0030 04/07/23 0335  LATICACIDVEN 3.3* 1.3   Recent Results (from the past 240 hours)  Culture, blood (Routine x 2)     Status: Abnormal   Collection Time: 04/05/23 11:24 PM   Specimen: BLOOD  Result Value Ref Range Status   Specimen Description   Final    BLOOD LEFT ANTECUBITAL Performed at [facility], [address], [place], [place] [address]    Special Requests   Final    BOTTLES DRAWN AEROBIC AND ANAEROBIC Blood Culture adequate volume Performed at [facility], [address], [place], [place] [address]    Culture  Setup Time   Final    GRAM NEGATIVE RODS BOTTLES DRAWN AEROBIC ONLY CRITICAL RESULT CALLED TO, READ BACK BY AND VERIFIED WITH: Abe People [id number] @1955  FH Performed at [facility], [address], [place], [place] [address]    Culture FLAVOBACTERIUM ODORATUM (A)  Final   Report Status 04/08/2023 FINAL  Final   Organism ID, Bacteria FLAVOBACTERIUM ODORATUM  Final      Susceptibility   Flavobacterium odoratum - MIC*    CEFTAZIDIME >=64 RESISTANT Resistant     CIPROFLOXACIN >=4 RESISTANT Resistant      GENTAMICIN >=16 RESISTANT Resistant     IMIPENEM 2 SENSITIVE Sensitive     TRIMETH/SULFA >=320 RESISTANT Resistant     PIP/TAZO >=128 RESISTANT Resistant ug/mL    * FLAVOBACTERIUM ODORATUM  Blood Culture ID Panel (Reflexed)     Status: None   Collection Time: 04/05/23 11:24 PM  Result Value Ref Range Status   Enterococcus faecalis NOT DETECTED NOT DETECTED Final   Enterococcus Faecium NOT DETECTED NOT DETECTED Final   Listeria monocytogenes NOT DETECTED NOT DETECTED Final   Staphylococcus species NOT DETECTED NOT DETECTED Final   Staphylococcus aureus (BCID) NOT DETECTED NOT DETECTED Final   Staphylococcus epidermidis NOT DETECTED NOT DETECTED Final   Staphylococcus lugdunensis NOT DETECTED NOT DETECTED Final   Streptococcus species NOT DETECTED NOT DETECTED Final   Streptococcus agalactiae NOT DETECTED NOT DETECTED Final   Streptococcus pneumoniae NOT DETECTED NOT DETECTED Final   Streptococcus pyogenes NOT DETECTED NOT DETECTED Final   A.calcoaceticus-baumannii NOT DETECTED NOT DETECTED Final   Bacteroides fragilis NOT DETECTED NOT DETECTED Final   Enterobacterales NOT DETECTED NOT DETECTED Final   Enterobacter cloacae complex NOT DETECTED NOT DETECTED Final   Escherichia coli NOT DETECTED NOT DETECTED Final   Klebsiella aerogenes NOT DETECTED NOT DETECTED Final   Klebsiella oxytoca NOT DETECTED NOT DETECTED Final   Klebsiella pneumoniae NOT DETECTED NOT DETECTED Final   Proteus species NOT DETECTED NOT DETECTED Final   Salmonella species NOT DETECTED NOT DETECTED Final   Serratia marcescens NOT DETECTED NOT DETECTED Final   Haemophilus influenzae NOT DETECTED NOT DETECTED Final   Neisseria meningitidis NOT DETECTED NOT DETECTED Final   Pseudomonas aeruginosa NOT DETECTED NOT DETECTED Final   Stenotrophomonas maltophilia NOT DETECTED NOT DETECTED Final   Candida albicans NOT DETECTED NOT DETECTED Final   Candida auris NOT DETECTED NOT DETECTED Final   Candida glabrata NOT  DETECTED NOT DETECTED Final   Candida krusei NOT DETECTED NOT DETECTED Final   Candida  parapsilosis NOT DETECTED NOT DETECTED Final   Candida tropicalis NOT DETECTED NOT DETECTED Final   Cryptococcus neoformans/gattii NOT DETECTED NOT DETECTED Final    Comment: Performed at [facility], [address], [place], [place] [address]  Culture, blood (Routine x 2)     Status: Abnormal   Collection Time: 04/06/23 12:03 AM   Specimen: BLOOD LEFT FOREARM  Result Value Ref Range Status   Specimen Description   Final    BLOOD LEFT FOREARM Performed at [facility], [address], [place], [place] [address]    Special Requests   Final    BOTTLES DRAWN AEROBIC AND ANAEROBIC Blood Culture results may not be optimal due to an inadequate volume of blood received in culture bottles Performed at [facility], [address] [place], [place] [address]    Culture  Setup Time   Final    GRAM NEGATIVE RODS BOTTLES DRAWN AEROBIC ONLY CRITICAL VALUE NOTED.  VALUE IS CONSISTENT WITH PREVIOUSLY REPORTED AND CALLED VALUE.    Culture (A)  Final    FLAVOBACTERIUM ODORATUM SUSCEPTIBILITIES PERFORMED ON PREVIOUS CULTURE WITHIN THE LAST 5 DAYS. Performed at [facility], [address] [place], [place] [address]    Report Status 04/08/2023 FINAL  Final  Antimicrobials/Microbiology: Anti-infectives (From admission, onward)    Start     Dose/Rate Route Frequency Ordered Stop   04/14/23 1000  cefadroxil (DURICEF) capsule 1,000 mg        1,000 mg Oral 2 times daily 04/10/23 1209 04/21/23 0959   04/14/23 0800  cefadroxil (DURICEF) capsule 1,000 mg  Status:  Discontinued        1,000 mg Oral 2 times daily 04/11/23 1306 04/11/23 1311   04/07/23 1100  meropenem (MERREM) 1 g in sodium chloride 0.9 % 100 mL IVPB        1 g 200 mL/hr over 30 Minutes Intravenous Every 8 hours 04/07/23 1013 04/14/23 1359   04/07/23 1000  vancomycin (VANCOREADY) IVPB 1250 mg/250 mL   Status:  Discontinued        1,250 mg 166.7 mL/hr over 90 Minutes Intravenous Every 36 hours 04/06/23 0537 04/07/23 1013   04/07/23 1000  azithromycin (ZITHROMAX) tablet 500 mg  Status:  Discontinued        500 mg Oral Daily 04/07/23 0719 04/07/23 1102   04/06/23 2200  azithromycin (ZITHROMAX) 500 mg in sodium chloride 0.9 % 250 mL IVPB  Status:  Discontinued        500 mg 250 mL/hr over 60 Minutes Intravenous Every 24 hours 04/06/23 2033 04/07/23 0719   04/06/23 2200  ceFEPIme (MAXIPIME) 2 g in sodium chloride 0.9 % 100 mL IVPB  Status:  Discontinued        2 g 200 mL/hr over 30 Minutes Intravenous Every 12 hours 04/06/23 2033 04/07/23 1013   04/06/23 1400  metroNIDAZOLE (FLAGYL) IVPB 500 mg  Status:  Discontinued        500 mg 100 mL/hr over 60 Minutes Intravenous Every 12 hours 04/06/23 0527 04/06/23 2032   04/06/23 1200  cefTRIAXone (ROCEPHIN) 2 g in sodium chloride 0.9 % 100 mL IVPB  Status:  Discontinued        2 g 200 mL/hr over 30 Minutes Intravenous Every 24 hours 04/06/23 0526 04/06/23 2032   04/06/23 1000  rifaximin (XIFAXAN) tablet 550 mg        550 mg Oral 2 times daily 04/06/23 0525     04/06/23  0100  ceFEPIme (MAXIPIME) 2 g in sodium chloride 0.9 % 100 mL IVPB        2 g 200 mL/hr over 30 Minutes Intravenous  Once 04/06/23 0048 04/06/23 0146   04/06/23 0100  metroNIDAZOLE (FLAGYL) IVPB 500 mg        500 mg 100 mL/hr over 60 Minutes Intravenous  Once 04/06/23 0048 04/06/23 0317   04/06/23 0100  vancomycin (VANCOCIN) IVPB 1000 mg/200 mL premix        1,000 mg 200 mL/hr over 60 Minutes Intravenous  Once 04/06/23 0048 04/06/23 0421         Component Value Date/Time   SDES  04/06/2023 0003    BLOOD LEFT FOREARM Performed at [facility], [address] [place], [place] [address]    SPECREQUEST  04/06/2023 0003    BOTTLES DRAWN AEROBIC AND ANAEROBIC Blood Culture results may not be optimal due to an inadequate volume of blood received in culture  bottles Performed at [facility], [address], [place], [place] [address]    CULT (A) 04/06/2023 0003    FLAVOBACTERIUM ODORATUM SUSCEPTIBILITIES PERFORMED ON PREVIOUS CULTURE WITHIN THE LAST 5 DAYS. Performed at [facility], [address], [place], [place] [address]    REPTSTATUS 04/08/2023 FINAL 04/06/2023 0003     Radiology Studies: No results found.   LOS: 6 days   Total time spent in review of labs and imaging, patient evaluation, formulation of plan, documentation and communication with patient/family: 35 minutes  Lanae Boast, MD [facility] 04/12/2023, 11:49 AM

## 2023-04-13 DIAGNOSIS — N179 Acute kidney failure, unspecified: Secondary | ICD-10-CM | POA: Diagnosis not present

## 2023-04-13 DIAGNOSIS — A419 Sepsis, unspecified organism: Secondary | ICD-10-CM | POA: Diagnosis not present

## 2023-04-13 DIAGNOSIS — R6520 Severe sepsis without septic shock: Secondary | ICD-10-CM | POA: Diagnosis not present

## 2023-04-13 LAB — BASIC METABOLIC PANEL WITH GFR
Anion gap: 3 — ABNORMAL LOW (ref 5–15)
BUN: 20 mg/dL (ref 8–23)
CO2: 23 mmol/L (ref 22–32)
Calcium: 7.6 mg/dL — ABNORMAL LOW (ref 8.9–10.3)
Chloride: 109 mmol/L (ref 98–111)
Creatinine, Ser: 0.81 mg/dL (ref 0.44–1.00)
GFR, Estimated: >60 mL/min (ref >60)
Glucose, Bld: 109 mg/dL — ABNORMAL HIGH (ref 70–99)
Potassium: 3.5 mmol/L (ref 3.5–5.1)
Sodium: 135 mmol/L (ref 135–145)

## 2023-04-13 MED ORDER — DICLOFENAC SODIUM 1 % EX GEL: 2.0000 g | Freq: Three times a day (TID) | CUTANEOUS | Status: DC | PRN

## 2023-04-13 MED ORDER — DIPHENHYDRAMINE HCL 25 MG PO CAPS
25.0000 mg | ORAL_CAPSULE | Freq: Four times a day (QID) | ORAL | Status: DC | PRN
  Administered 2023-04-13 – 2023-04-14 (×2): 25 mg via ORAL
  Filled 2023-04-13 (×2): qty 1

## 2023-04-13 NOTE — Progress Notes (Signed)
 PROGRESS NOTE [name]  WJX:[id number] DOB: 08/31/1955 DOA: 04/05/2023 PCP: [name], MD  Brief Narrative/Hospital Course: 68 y.o. female with history of liver cirrhosis secondary to [facility], hypothyroidism, hyperlipidemia, GERD, chronic anemia and thrombocytopenia recently admitted to the hospital for hepatic encephalopathy discharged on 04/03/2023 was noticed by patient's son that patient was getting confused and was complaining of pain in the right thigh area , she had a small scratch on the right thigh and her thigh became more erythematous and painful and patient also became confused was brought to the ER and found to be tachycardic febrile and admitted for right thigh cellulitis.  Significant procedures/testing: CXR Shallow inspiration. No evidence of active pulmonary disease  CT right femur Diffuse fat stranding of the subcutaneous lateral and medial right thigh, extending to the superficial fascia of the underlying anterior and posterior compartment musculature, concerning for cellulitis. These findings are similar to slightly increased  compared to the prior exam. No discrete loculated fluid collection identified. No soft tissue gas. 2. No acute osseous abnormality.  Subjective: Seen and examined this morning Doing well son at the bedside Having some rash on the left side had some fluid leaking previously Overnight afebrile BP stable on room air Labs reviewed this morning stable BMP  Assessment and plan: Sepsis POA RLE cellulitis Flavobacterium bacteremia: Patient presented with sepsis in the setting of right thigh cellulitis, and bacteremia with her immunocompromise status from liver cirrhosis. Seen by ID- cont IV meropenem: EOT 04/14/23 for bacteremia > then change to cefadroxil x 7 more days for cellulitis. Continue contact precaution. ID signed off  Liver cirrhosis due to NASH Repeated episodes of acute encephalopathy Coagulopathy with elevated INR from  cirrhosis: Continue her rifaximin, lactulose.  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 Monitor labs Recent Labs  Lab 04/08/23 0431 04/10/23 0327 04/12/23 0406  PLT 109* 108* 125*    AKI: Resolved. Cont to monitor  RLS: Chronic and stable.  Left eye leukoma: Supportive care.  Hypomagnesemia: Improved. Monitor.  Abdominal wall hernia: Chronic.stable  Short gut syndrome Chronic diarrhea: Continue supportive care.  Lactulose.  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   DVT prophylaxis: No anticoagulation due to risk of bleeding Code Status:   Code Status: Limited: Do not attempt resuscitation (DNR) -DNR-LIMITED -Do Not Intubate/DNI  Family Communication: plan of care discussed with patient and her son  at bedside. Patient status is: Remains hospitalized because of severity of illness Level of care: Progressive   Dispo: The patient is from: HOME.  Patient doing well on ambulation with PT            Anticipated disposition: TBD  Objective: Vitals last 24 hrs: Vitals:  04/12/23 0523 04/12/23 1229 04/12/23 2057 04/13/23 0520  BP: (!) 110/57 139/61 131/65 (!) 103/54  Pulse: 72 72 71 72  Resp:  18 19 20   Temp:  97.6 F (36.4 C) 98.1 F (36.7 C) 98.2 F (36.8 C)  TempSrc:   Oral Oral  SpO2:  94% 99% 99%  Weight:      Height:       Weight change:    Physical Examination: General exam: alert awake, oriented  HEENT:Oral mucosa moist, Ear/Nose WNL grossly Respiratory system: Bilaterally clear BS,no use of accessory muscle Cardiovascular system: S1 & S2 +, No JVD. Gastrointestinal system: Abdomen soft,NT,ND, BS+ Nervous System: Alert, awake, moving all extremities,and following commands. Extremities: LE edema +, reduced petechial rash on the left thigh- distal peripheral pulses palpable and warm.  Skin: No rashes,no icterus. MSK: Normal muscle bulk,tone, power   Medications reviewed:  Scheduled Meds:  atorvastatin  40 mg Oral q AM   [START ON 04/14/2023] cefadroxil  1,000 mg Oral BID   copper  2 mg Oral BID   cyanocobalamin  2,000 mcg Oral Q Fri   famotidine  20 mg Oral q AM   ferrous sulfate  325 mg Oral Q breakfast   folic acid  1 mg Oral Daily   furosemide  40 mg Oral Daily   lactulose  20 g Oral TID   levothyroxine  175 mcg Oral Q0600   midodrine  10 mg Oral TID WC   nystatin   Topical TID   pantoprazole  40 mg Oral BID AC   rifaximin  550 mg Oral BID   rOPINIRole  0.25 mg Oral QHS   sodium bicarbonate  1,300 mg Oral BID   spironolactone  50 mg Oral Daily   venlafaxine XR  75 mg Oral Q breakfast   Continuous Infusions:  meropenem (MERREM) IV 1 g (04/13/23 1610)    Diet Order             Diet Heart Room service appropriate? Yes; Fluid consistency: Thin  Diet effective now                  Intake/Output Summary (Last 24 hours) at 04/13/2023 1114 Last data filed at 04/12/2023 1500 Gross per 24 hour  Intake 360 ml  Output --  Net 360 ml   Net IO Since Admission: 4,720.21 mL [04/13/23 1114]  Wt Readings from Last 3 Encounters:  04/05/23 94.6 kg  03/29/23 94.6 kg  03/08/23 94.6 kg     Unresulted Labs (From admission, onward)     Start     Ordered   04/13/23 0500  Basic metabolic panel with GFR  Daily,   R      04/12/23 0909   04/06/23 0524  CBC  (heparin)  Once,   R       Comments: Baseline for heparin  therapy IF NOT ALREADY DRAWN.  Notify MD if PLT < 100 K.    04/06/23 0525          Data Reviewed: I have personally reviewed following labs and imaging studies ( see epic result tab) CBC: Recent Labs  Lab 04/08/23 0431 04/10/23 0327 04/12/23 0406  WBC 10.5 6.1 6.9  NEUTROABS 7.6 3.2 3.4  HGB 8.5* 9.1* 9.4*  HCT 28.7* 29.9* 30.6*  MCV 98.0 96.1 93.3  PLT 109* 108* 125*   CMP: Recent Labs  Lab 04/07/23 0335 04/08/23 0431 04/09/23 0347 04/10/23 0327 04/12/23 0406 04/13/23 0351  NA 134* 136 139 138  139 135  K 3.8 3.4* 4.0 3.9 3.5 3.5  CL 111 113* 116* 112* 113* 109  CO2 16* 17* 20* 21* 23 23  GLUCOSE 88 134* 108* 78 120* 109*  BUN 24* 27* 24* 19 20 20   CREATININE 1.30* 1.28* 0.91 0.83 0.83 0.81  CALCIUM 7.6* 7.7* 7.8* 7.9* 7.7* 7.6*  MG 1.5* 2.0 1.9  --   --   --    GFR: Estimated Creatinine Clearance: 74.2 mL/min (by C-G formula based on SCr of 0.81 mg/dL). Recent Labs  Lab 04/07/23 0335 04/08/23 0431 04/09/23 0347  AST 55* 49* 43*  ALT 24 26 28   ALKPHOS 48 61 59  BILITOT 1.5* 1.4* 1.3*  PROT 4.7* 4.5* 4.6*  ALBUMIN 2.1* 2.0* 2.1*   No results for input(s): "LIPASE", "AMYLASE" in the last 168 hours.  No results for input(s): "AMMONIA" in the last 168 hours.  Coagulation Profile:  No results for input(s): "INR", "PROTIME" in the last 168 hours. Sepsis Labs: Recent Labs  Lab 04/07/23 0335  LATICACIDVEN 1.3   Recent Results (from the past 240 hours)  Culture, blood (Routine x 2)     Status: Abnormal   Collection Time: 04/05/23 11:24 PM   Specimen: BLOOD  Result Value Ref Range Status   Specimen Description   Final    BLOOD LEFT ANTECUBITAL Performed at [facility], [address], [place], [place] [address]    Special Requests   Final    BOTTLES DRAWN AEROBIC AND ANAEROBIC Blood Culture adequate volume Performed at [facility], [address] [place], [place] [address]    Culture  Setup Time   Final     GRAM NEGATIVE RODS BOTTLES DRAWN AEROBIC ONLY CRITICAL RESULT CALLED TO, READ BACK BY AND VERIFIED WITH: Abe People [id number] @1955  FH Performed at [facility], [address], [place], [place] [address]    Culture FLAVOBACTERIUM ODORATUM (A)  Final   Report Status 04/08/2023 FINAL  Final   Organism ID, Bacteria FLAVOBACTERIUM ODORATUM  Final      Susceptibility   Flavobacterium odoratum - MIC*    CEFTAZIDIME >=64 RESISTANT Resistant     CIPROFLOXACIN >=4 RESISTANT Resistant     GENTAMICIN >=16 RESISTANT Resistant     IMIPENEM 2 SENSITIVE Sensitive     TRIMETH/SULFA >=320 RESISTANT Resistant     PIP/TAZO >=128 RESISTANT Resistant ug/mL    * FLAVOBACTERIUM ODORATUM  Blood Culture ID Panel (Reflexed)     Status: None   Collection Time: 04/05/23 11:24 PM  Result Value Ref Range Status   Enterococcus faecalis NOT DETECTED NOT DETECTED Final   Enterococcus Faecium NOT DETECTED NOT DETECTED Final   Listeria monocytogenes NOT DETECTED NOT DETECTED Final   Staphylococcus species NOT DETECTED NOT DETECTED Final   Staphylococcus aureus (BCID) NOT DETECTED NOT DETECTED Final   Staphylococcus epidermidis NOT DETECTED NOT DETECTED Final   Staphylococcus lugdunensis NOT DETECTED NOT DETECTED Final   Streptococcus species NOT DETECTED NOT DETECTED Final   Streptococcus agalactiae NOT DETECTED NOT DETECTED Final   Streptococcus pneumoniae NOT DETECTED NOT DETECTED Final   Streptococcus pyogenes NOT DETECTED NOT DETECTED Final   A.calcoaceticus-baumannii NOT DETECTED NOT DETECTED Final   Bacteroides fragilis NOT DETECTED NOT DETECTED Final   Enterobacterales NOT DETECTED NOT DETECTED Final   Enterobacter cloacae complex NOT DETECTED NOT DETECTED Final   Escherichia coli NOT DETECTED NOT DETECTED Final   Klebsiella aerogenes NOT DETECTED NOT DETECTED Final   Klebsiella oxytoca  NOT DETECTED NOT DETECTED Final   Klebsiella pneumoniae NOT DETECTED NOT DETECTED Final   Proteus species  NOT DETECTED NOT DETECTED Final   Salmonella species NOT DETECTED NOT DETECTED Final   Serratia marcescens NOT DETECTED NOT DETECTED Final   Haemophilus influenzae NOT DETECTED NOT DETECTED Final   Neisseria meningitidis NOT DETECTED NOT DETECTED Final   Pseudomonas aeruginosa NOT DETECTED NOT DETECTED Final   Stenotrophomonas maltophilia NOT DETECTED NOT DETECTED Final   Candida albicans NOT DETECTED NOT DETECTED Final   Candida auris NOT DETECTED NOT DETECTED Final   Candida glabrata NOT DETECTED NOT DETECTED Final   Candida krusei NOT DETECTED NOT DETECTED Final   Candida parapsilosis NOT DETECTED NOT DETECTED Final   Candida tropicalis NOT DETECTED NOT DETECTED Final   Cryptococcus neoformans/gattii NOT DETECTED NOT DETECTED Final    Comment: Performed at [facility], [address], [place], [place] [address]  Culture, blood (Routine x 2)     Status: Abnormal   Collection Time: 04/06/23 12:03 AM   Specimen: BLOOD LEFT FOREARM  Result Value Ref Range Status   Specimen Description   Final    BLOOD LEFT FOREARM Performed at [facility], [address] [place], [place] [address]    Special Requests   Final    BOTTLES DRAWN AEROBIC AND ANAEROBIC Blood Culture results may not be optimal due to an inadequate volume of blood received in culture bottles Performed at [facility], [address] [place], [place] [address]    Culture  Setup Time   Final    GRAM NEGATIVE RODS BOTTLES DRAWN AEROBIC ONLY CRITICAL VALUE NOTED.  VALUE IS CONSISTENT WITH PREVIOUSLY REPORTED AND CALLED VALUE.    Culture (A)  Final    FLAVOBACTERIUM ODORATUM SUSCEPTIBILITIES PERFORMED ON PREVIOUS CULTURE WITHIN THE LAST 5 DAYS. Performed at [facility], [address] [place], [place] [address]    Report Status 04/08/2023 FINAL  Final  Antimicrobials/Microbiology: Anti-infectives (From admission, onward)    Start     Dose/Rate Route Frequency Ordered  Stop   04/14/23 1000  cefadroxil (DURICEF) capsule 1,000 mg        1,000 mg Oral 2 times daily 04/10/23 1209 04/21/23 0959   04/14/23 0800  cefadroxil (DURICEF) capsule 1,000 mg  Status:  Discontinued        1,000 mg Oral 2 times daily 04/11/23 1306 04/11/23 1311   04/07/23 1100  meropenem (MERREM) 1 g in sodium chloride 0.9 % 100 mL IVPB        1 g 200 mL/hr over 30 Minutes Intravenous Every 8 hours 04/07/23 1013 04/14/23 1359   04/07/23 1000  vancomycin (VANCOREADY) IVPB 1250 mg/250 mL  Status:  Discontinued        1,250 mg 166.7 mL/hr over 90 Minutes Intravenous Every 36 hours 04/06/23 0537 04/07/23 1013   04/07/23 1000  azithromycin (ZITHROMAX) tablet 500 mg  Status:  Discontinued        500 mg Oral Daily 04/07/23 0719 04/07/23 1102   04/06/23 2200  azithromycin (ZITHROMAX) 500 mg in sodium chloride 0.9 % 250 mL IVPB  Status:  Discontinued        500 mg 250 mL/hr over 60 Minutes Intravenous Every 24 hours 04/06/23 2033 04/07/23 0719   04/06/23 2200  ceFEPIme (MAXIPIME) 2 g in sodium chloride 0.9 % 100 mL IVPB  Status:  Discontinued        2 g 200 mL/hr over 30 Minutes  Intravenous Every 12 hours 04/06/23 2033 04/07/23 1013   04/06/23 1400  metroNIDAZOLE (FLAGYL) IVPB 500 mg  Status:  Discontinued        500 mg 100 mL/hr over 60 Minutes Intravenous Every 12 hours 04/06/23 0527 04/06/23 2032   04/06/23 1200  cefTRIAXone (ROCEPHIN) 2 g in sodium chloride 0.9 % 100 mL IVPB  Status:  Discontinued        2 g 200 mL/hr over 30 Minutes Intravenous Every 24 hours 04/06/23 0526 04/06/23 2032   04/06/23 1000  rifaximin (XIFAXAN) tablet 550 mg        550 mg Oral 2 times daily 04/06/23 0525     04/06/23 0100  ceFEPIme (MAXIPIME) 2 g in sodium chloride 0.9 % 100 mL IVPB        2 g 200 mL/hr over 30 Minutes Intravenous  Once 04/06/23 0048 04/06/23 0146   04/06/23 0100  metroNIDAZOLE (FLAGYL) IVPB 500 mg        500 mg 100 mL/hr over 60 Minutes Intravenous  Once 04/06/23 0048 04/06/23 0317    04/06/23 0100  vancomycin (VANCOCIN) IVPB 1000 mg/200 mL premix        1,000 mg 200 mL/hr over 60 Minutes Intravenous  Once 04/06/23 0048 04/06/23 0421         Component Value Date/Time   SDES  04/06/2023 0003    BLOOD LEFT FOREARM Performed at [facility], [address] [place], [place] [address]    SPECREQUEST  04/06/2023 0003    BOTTLES DRAWN AEROBIC AND ANAEROBIC Blood Culture results may not be optimal due to an inadequate volume of blood received in culture bottles Performed at [facility], [address] [place], [place] [address]    CULT (A) 04/06/2023 0003    FLAVOBACTERIUM ODORATUM SUSCEPTIBILITIES PERFORMED ON PREVIOUS CULTURE WITHIN THE LAST 5 DAYS. Performed at [facility], [address] [place], [place] [address]    REPTSTATUS 04/08/2023 FINAL 04/06/2023 0003     Radiology Studies: No results found.   LOS: 7 days   Total time spent in review of labs and imaging, patient evaluation, formulation of plan, documentation and communication with patient/family: 35 minutes  Lanae Boast, MD [facility] 04/13/2023, 11:14 AM

## 2023-04-13 NOTE — Progress Notes (Signed)
 Mobility Specialist - Progress Note   04/13/23 1113  Mobility  Activity Ambulated with assistance in hallway  Level of Assistance Standby assist, set-up cues, supervision of patient - no hands on  Assistive Device Front wheel walker  Distance Ambulated (ft) 160 ft  Activity Response Tolerated well  Mobility Referral Yes  Mobility visit 1 Mobility  Mobility Specialist Stop Time (ACUTE ONLY) 1112   Pt received in bed and agreeable to mobility. No complaints during session. Pt took x1 seated rest break d/t  L knee pain. Pt to EOB after session with all needs met.     [facility]

## 2023-04-13 NOTE — Plan of Care (Signed)

## 2023-04-14 ENCOUNTER — Other Ambulatory Visit (HOSPITAL_COMMUNITY): Payer: Self-pay

## 2023-04-14 DIAGNOSIS — R6520 Severe sepsis without septic shock: Secondary | ICD-10-CM | POA: Diagnosis not present

## 2023-04-14 DIAGNOSIS — A419 Sepsis, unspecified organism: Secondary | ICD-10-CM | POA: Diagnosis not present

## 2023-04-14 DIAGNOSIS — N179 Acute kidney failure, unspecified: Secondary | ICD-10-CM | POA: Diagnosis not present

## 2023-04-14 LAB — BASIC METABOLIC PANEL WITH GFR
Anion gap: 7 (ref 5–15)
BUN: 20 mg/dL (ref 8–23)
CO2: 22 mmol/L (ref 22–32)
Calcium: 7.8 mg/dL — ABNORMAL LOW (ref 8.9–10.3)
Chloride: 111 mmol/L (ref 98–111)
Creatinine, Ser: 0.84 mg/dL (ref 0.44–1.00)
GFR, Estimated: >60 mL/min (ref >60)
Glucose, Bld: 120 mg/dL — ABNORMAL HIGH (ref 70–99)
Potassium: 3.7 mmol/L (ref 3.5–5.1)
Sodium: 140 mmol/L (ref 135–145)

## 2023-04-14 MED ORDER — FUROSEMIDE 20 MG PO TABS
ORAL_TABLET | ORAL | 0 refills | Status: DC
  Filled 2023-04-14: qty 20, 7d supply, fill #0

## 2023-04-14 MED ORDER — CEFADROXIL 500 MG PO CAPS
1000.0000 mg | ORAL_CAPSULE | Freq: Two times a day (BID) | ORAL | 0 refills | Status: AC
  Filled 2023-04-14: qty 28, 7d supply, fill #0

## 2023-04-14 NOTE — Plan of Care (Signed)

## 2023-04-14 NOTE — Discharge Summary (Signed)
 Physician Discharge Summary  [name] ZOX:[id number] DOB: 1955-10-31 DOA: 04/05/2023  PCP: [name], MD  Admit date: 04/05/2023 Discharge date: 04/14/2023 Recommendations for Outpatient Follow-up:  Follow up with PCP in 1 weeks-call for appointment Please obtain BMP/CBC in one week  Discharge Dispo: Home Discharge Condition: Stable Code Status:   Code Status: Limited: Do not attempt resuscitation (DNR) -DNR-LIMITED -Do Not Intubate/DNI  Diet recommendation:  Diet Order             Diet Heart Room service appropriate? Yes; Fluid consistency: Thin  Diet effective now                    Brief/Interim Summary: 68 y.o. female with history of liver cirrhosis secondary to [facility], hypothyroidism, hyperlipidemia, GERD, chronic anemia and thrombocytopenia recently admitted to the hospital for hepatic encephalopathy discharged on 04/03/2023 was noticed by patient's son that patient was getting confused and was complaining of pain in the right thigh area , she had a small scratch on the right thigh and her thigh became more erythematous and painful and patient also became confused was brought to the ER and found to be tachycardic febrile and admitted for right thigh cellulitis. Patient was treated with IV antibiotics, she had flava bacterium bacteremia-ID was consulted, received meropenem through 4/4 and transition to cefadroxil to complete 7 more days.  Alert awake and oriented no asterixis and remains stable. ?  Lump on her right breast similar on the left with mild erythema small rash does have fluid overload state will increase diuretics x 7 days, advised to follow-up with PCP next week if does not resolve will need further workup-patient at the bedside instructed.  Significant procedures/testing: CXR Shallow inspiration. No evidence of active pulmonary disease  CT right femur Diffuse fat stranding of the subcutaneous lateral and medial right thigh, extending to the superficial fascia  of the underlying anterior and posterior compartment musculature, concerning for cellulitis. These findings are similar to slightly increased  compared to the prior exam. No discrete loculated fluid collection identified. No soft tissue gas. 2. No acute osseous abnormality.  Subjective: Seen and examined this morning Alert awake oriented.  Eager to go home this morning Overnight afebrile BP stable, overall mental status stable   Discharge diagnosis:  Primary problem: sepsis POA RLE cellulitis Flavobacterium bacteremia: Patient presented with sepsis in the setting of right thigh cellulitis, and bacteremia with her immunocompromise status from liver cirrhosis. Seen by ID-completed IV meropenem 04/14/23,  now on cefadroxil  cont x 7 more days for cellulitis. Continue contact precaution. ID signed off  Liver cirrhosis due to NASH Repeated episodes of acute encephalopathy Coagulopathy with elevated INR from cirrhosis Fluid overload with thigh swelling/breast swelling-more on dependent part: Continue her rifaximin, lactulose.  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 Monitor labs Recent Labs  Lab 04/08/23 0431 04/10/23 0327 04/12/23 0406  PLT 109* 108* 125*    AKI: Resolved. Cont to monitor  RLS: Chronic and stable.  Left eye leukoma: Supportive care.  Hypomagnesemia: Improved. Monitor.  Abdominal wall hernia: Chronic.stable  Short gut syndrome Chronic  diarrhea: Continue supportive care.  Lactulose.  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  Deconditioning/debility: Patient has complex medical comorbidities, she has a recurrent admission and she remains at risk for recurrent admissions. She will benefit with outpatient Perative care follow-up.  She is DNR   Discharge Exam: Vitals:   04/13/23 2028 04/14/23 0501  BP: (!) 129/53 (!) 112/56  Pulse: 66 70  Resp: 16 20  Temp: 98.6 F (37 C) 98.4 F (36.9 C)  SpO2: 99% 99%   General: Pt is alert, awake, not in acute distress Cardiovascular: RRR, S1/S2 +, no rubs, no gallops Respiratory: CTA bilaterally, no wheezing, no rhonchi Abdominal: Soft, NT, ND, bowel sounds + Extremities: thigh edema, no cyanosis  Discharge Instructions  Discharge Instructions     Discharge instructions   Complete by: As directed    Please call call MD or return to ER for similar or worsening recurring problem that brought you to hospital or if any fever,nausea/vomiting,abdominal pain, uncontrolled pain, chest pain,  shortness of breath or any other alarming symptoms.  Please follow-up your doctor as instructed in a week time and call the office for appointment.  Please avoid alcohol, smoking, or any other illicit substance and maintain healthy habits including taking your regular medications as prescribed.  You were cared for by a hospitalist during your hospital stay. If you have any questions about your discharge medications or the care you received while you were in the hospital after you are discharged, you can call the unit and ask to speak with the hospitalist on call if the hospitalist that took care of you is not available.  Once you are discharged, your primary care physician will handle any further medical issues. Please note that  NO REFILLS for any discharge medications will be authorized once you are discharged, as it is imperative that you return to your primary care physician (or establish a relationship with a primary care physician if you do not have one) for your aftercare needs so that they can reassess your need for medications and monitor your lab values   Increase activity slowly   Complete by: As directed    No wound care   Complete by: As directed       Allergies as of 04/14/2023       Reactions   Ace Inhibitors Anaphylaxis, Swelling   Ivp Dye [iodinated Contrast Media] Anaphylaxis   Desitin [zinc Oxide] Rash, Other (See Comments)   Worsens rash   Gadolinium Derivatives Other (See Comments)   Reaction??   Hydroxyzine Other (See Comments)   Affects the mind   Chlorhexidine Rash, Other (See Comments)   WORSENS RASHES   Doxycycline Rash   Penicillins Rash   Zofran [ondansetron] Rash        Medication List     TAKE these medications    albuterol 108 (90 Base) MCG/ACT inhaler Commonly known as: VENTOLIN HFA Inhale 2 puffs into the lungs every 6 (six) hours as needed for shortness of breath or wheezing.  atorvastatin 40 MG tablet Commonly known as: LIPITOR Take 1 tablet (40 mg total) by mouth in the morning.   cefadroxil 500 MG capsule Commonly known as: DURICEF Take 2 capsules (1,000 mg total) by mouth 2 (two) times daily for 7 days.   CENTRUM SILVER ULTRA WOMENS PO Take 1 tablet by mouth in the morning.   copper tablet Take 2 mg by mouth in the morning and at bedtime.   cyanocobalamin 1000 MCG tablet Commonly known as: VITAMIN B12 Take 2,000 mcg by mouth every Monday, Wednesday, and Friday.   dicyclomine 10 MG capsule Commonly known as: BENTYL Take 1 capsule (10 mg total) by mouth 3 (three) times daily as needed for spasms (abdominal cramping).   famotidine 20 MG tablet Commonly known as: PEPCID Take 20 mg by mouth in the morning.   folic acid 1 MG tablet Commonly known  as: FOLVITE Take 1 tablet (1 mg total) by mouth daily.   furosemide 20 MG tablet Commonly known as: LASIX Take 60mg  daily x 1 wk then go back to home dose 40 mg daily , Take additional 40 mg if there is a weight gain of 3 lbs in1 day or 5 lbs in 1 week. What changed:  medication strength how much to take how to take this when to take this additional instructions   HYDROcodone-acetaminophen 10-325 MG tablet Commonly known as: NORCO Take 1 tablet by mouth every 8 (eight) hours as needed for moderate pain (pain score 4-6).   IRON-VITAMIN C PO Take 1 tablet by mouth daily with breakfast.   lactulose 10 GM/15ML solution Commonly known as: CHRONULAC Take 30 mLs (20 g total) by mouth 3 (three) times daily. Take to ensure that there is 2 loose bowel movement EVERY DAY What changed:  when to take this reasons to take this additional instructions   levothyroxine 175 MCG tablet Commonly known as: SYNTHROID Take 1 tablet (175 mcg total) by mouth daily at 6 (six) AM. Recheck TSH with PCP in 4 weeks   lidocaine 5 % Commonly known as: LIDODERM Place 1 patch onto the skin daily. Remove & Discard patch within 12 hours or as directed by MD What changed:  when to take this reasons to take this additional instructions   magnesium gluconate 500 (27 Mg) MG Tabs tablet Commonly known as: MAGONATE Take 500 mg by mouth in the morning and at bedtime.   megestrol 40 MG tablet Commonly known as: MEGACE Take 1 tablet (40 mg total) by mouth 2 (two) times daily.   methocarbamol 500 MG tablet Commonly known as: ROBAXIN Take 500 mg by mouth in the morning.   midodrine 10 MG tablet Commonly known as: PROAMATINE Take 1 tablet (10 mg total) by mouth 3 (three) times daily with meals.   naloxone 4 MG/0.1ML Liqd nasal spray kit Commonly known as: NARCAN Place 1 spray into the nose as needed (accidental overdose).   nystatin cream Commonly known as: MYCOSTATIN Apply topically 2 (two) times  daily. What changed:  how much to take when to take this reasons to take this   pantoprazole 40 MG tablet Commonly known as: PROTONIX Take 1 tablet (40 mg total) by mouth 2 (two) times daily before a meal.   potassium chloride 10 MEQ tablet Commonly known as: KLOR-CON Take 20 mEq by mouth 2 (two) times daily.   promethazine 12.5 MG tablet Commonly known as: PHENERGAN Take 12.5 mg by mouth every 6 (six) hours as needed for nausea or vomiting.  rifaximin 550 MG Tabs tablet Commonly known as: XIFAXAN Take 550 mg by mouth 2 (two) times daily.   rOPINIRole 0.25 MG tablet Commonly known as: REQUIP Take 1 tablet (0.25 mg total) by mouth at bedtime.   spironolactone 50 MG tablet Commonly known as: ALDACTONE Take 1 tablet (50 mg total) by mouth daily.   tamsulosin 0.4 MG Caps capsule Commonly known as: FLOMAX Take 1 capsule (0.4 mg total) by mouth daily.   triamcinolone cream 0.5 % Commonly known as: KENALOG Apply to affected area of right breast twice daily as directed.   venlafaxine XR 75 MG 24 hr capsule Commonly known as: EFFEXOR-XR Take 75 mg by mouth daily with breakfast.   Vitamin D (Ergocalciferol) 1.25 MG (50000 UNIT) Caps capsule Commonly known as: DRISDOL Take 50,000 Units by mouth every Wednesday.        Follow-up Information     [facility] Follow up.   Why: Iantha Fallen will resume PT in the home after discharge. Contact information: [address] [place] [address] [phone]         [name], MD Follow up in 1 week(s).   Specialty: Family Medicine Contact information: [address] [place] [address] [phone]                Allergies  Allergen Reactions   Ace Inhibitors Anaphylaxis and Swelling   Ivp Dye [Iodinated Contrast Media] Anaphylaxis   Desitin [Zinc Oxide] Rash and Other (See Comments)    Worsens rash   Gadolinium Derivatives Other (See Comments)    Reaction??    Hydroxyzine Other (See Comments)    Affects the mind   Chlorhexidine Rash and Other (See Comments)    WORSENS RASHES   Doxycycline Rash   Penicillins Rash   Zofran [Ondansetron] Rash    The results of significant diagnostics from this hospitalization (including imaging, microbiology, ancillary and laboratory) are listed below for reference.    Microbiology: Recent Results (from the past 240 hours)  Culture, blood (Routine x 2)     Status: Abnormal   Collection Time: 04/05/23 11:24 PM   Specimen: BLOOD  Result Value Ref Range Status   Specimen Description   Final    BLOOD LEFT ANTECUBITAL Performed at [facility], [address], [place], [place] [address]    Special Requests   Final    BOTTLES DRAWN AEROBIC AND ANAEROBIC Blood Culture adequate volume Performed at [facility], [address], [place], [place] [address]    Culture  Setup Time   Final    GRAM NEGATIVE RODS BOTTLES DRAWN AEROBIC ONLY CRITICAL RESULT CALLED TO, READ BACK BY AND VERIFIED WITH: Abe People [id number] @1953  FH Performed at [facility], [address] [place], [place] [address]    Culture FLAVOBACTERIUM ODORATUM (A)  Final   Report Status 04/08/2023 FINAL  Final   Organism ID, Bacteria FLAVOBACTERIUM ODORATUM  Final      Susceptibility   Flavobacterium odoratum - MIC*    CEFTAZIDIME >=64 RESISTANT Resistant     CIPROFLOXACIN >=4 RESISTANT Resistant     GENTAMICIN >=16 RESISTANT Resistant     IMIPENEM 2 SENSITIVE Sensitive     TRIMETH/SULFA >=320 RESISTANT Resistant     PIP/TAZO >=128 RESISTANT Resistant ug/mL    * FLAVOBACTERIUM ODORATUM  Blood Culture ID Panel (Reflexed)     Status: None   Collection Time: 04/05/23 11:24 PM  Result  Value Ref Range Status   Enterococcus faecalis NOT DETECTED NOT DETECTED Final   Enterococcus Faecium NOT DETECTED NOT DETECTED Final   Listeria monocytogenes NOT DETECTED NOT DETECTED Final   Staphylococcus  species NOT DETECTED NOT DETECTED Final   Staphylococcus aureus (BCID) NOT DETECTED NOT DETECTED Final   Staphylococcus epidermidis NOT DETECTED NOT DETECTED Final   Staphylococcus lugdunensis NOT DETECTED NOT DETECTED Final   Streptococcus species NOT DETECTED NOT DETECTED Final   Streptococcus agalactiae NOT DETECTED NOT DETECTED Final   Streptococcus pneumoniae NOT DETECTED NOT DETECTED Final   Streptococcus pyogenes NOT DETECTED NOT DETECTED Final   A.calcoaceticus-baumannii NOT DETECTED NOT DETECTED Final   Bacteroides fragilis NOT DETECTED NOT DETECTED Final   Enterobacterales NOT DETECTED NOT DETECTED Final   Enterobacter cloacae complex NOT DETECTED NOT DETECTED Final   Escherichia coli NOT DETECTED NOT DETECTED Final   Klebsiella aerogenes NOT DETECTED NOT DETECTED Final   Klebsiella oxytoca NOT DETECTED NOT DETECTED Final   Klebsiella pneumoniae NOT DETECTED NOT DETECTED Final   Proteus species NOT DETECTED NOT DETECTED Final   Salmonella species NOT DETECTED NOT DETECTED Final   Serratia marcescens NOT DETECTED NOT DETECTED Final   Haemophilus influenzae NOT DETECTED NOT DETECTED Final   Neisseria meningitidis NOT DETECTED NOT DETECTED Final   Pseudomonas aeruginosa NOT DETECTED NOT DETECTED Final   Stenotrophomonas maltophilia NOT DETECTED NOT DETECTED Final   Candida albicans NOT DETECTED NOT DETECTED Final   Candida auris NOT DETECTED NOT DETECTED Final   Candida glabrata NOT DETECTED NOT DETECTED Final   Candida krusei NOT DETECTED NOT DETECTED Final   Candida parapsilosis NOT DETECTED NOT DETECTED Final   Candida tropicalis NOT DETECTED NOT DETECTED Final   Cryptococcus neoformans/gattii NOT DETECTED NOT DETECTED Final    Comment: Performed at [facility], [address] [place], [place] [address]  Culture, blood (Routine x 2)     Status: Abnormal   Collection Time: 04/06/23 12:03 AM   Specimen: BLOOD LEFT FOREARM  Result Value Ref Range Status   Specimen  Description   Final    BLOOD LEFT FOREARM Performed at [name], [address], [place], [place] [address]    Special Requests   Final    BOTTLES DRAWN AEROBIC AND ANAEROBIC Blood Culture results may not be optimal due to an inadequate volume of blood received in culture bottles Performed at [facility], [address], [place], [place] [address]    Culture  Setup Time   Final    GRAM NEGATIVE RODS BOTTLES DRAWN AEROBIC ONLY CRITICAL VALUE NOTED.  VALUE IS CONSISTENT WITH PREVIOUSLY REPORTED AND CALLED VALUE.    Culture (A)  Final    FLAVOBACTERIUM ODORATUM SUSCEPTIBILITIES PERFORMED ON PREVIOUS CULTURE WITHIN THE LAST 5 DAYS. Performed at [facility], [address] [place], [place] [address]    Report Status 04/08/2023 FINAL  Final    Procedures/Studies: CT FEMUR RIGHT WO CONTRAST Result Date: 04/06/2023 CLINICAL DATA:  Cellulitis of the right leg. EXAM: CT OF THE LOWER RIGHT EXTREMITY WITHOUT CONTRAST TECHNIQUE: Multidetector CT imaging of the right lower extremity was performed according to the standard protocol. RADIATION DOSE REDUCTION: This exam was performed according to the departmental dose-optimization program which includes automated exposure control, adjustment of the mA and/or kV according to patient size and/or use of iterative reconstruction technique. COMPARISON:  CT of the right femur dated 03/07/2022. FINDINGS: Bones/Joint/Cartilage No acute osseous abnormality identified. No evidence of acute  osteolysis or erosive changes. Moderate tricompartmental degenerative changes of the right knee are again noted manifested by joint space narrowing, marginal osteophytosis, and subchondral sclerosis. Ligaments Ligaments are suboptimally evaluated by CT. Muscles and Tendons No discrete intramuscular collection identified. Muscle bulk is unchanged. Soft tissue Diffuse infiltration and stranding of the subcutaneous fat of the lateral and  medial right thigh, extending to the superficial fascia of the underlying anterior and posterior compartment musculature. There is overlying cutaneous thickening. No discrete loculated collection identified. No soft tissue gas. No enlarged lymph nodes identified in the field of view. Vascular calcifications of the right lower extremity. Calcified uterine fibroids are again noted. IMPRESSION: 1. Diffuse fat stranding of the subcutaneous lateral and medial right thigh, extending to the superficial fascia of the underlying anterior and posterior compartment musculature, concerning for cellulitis. These findings are similar to slightly increased compared to the prior exam. No discrete loculated fluid collection identified. No soft tissue gas. 2. No acute osseous abnormality. Electronically Signed   By: Hart Robinsons M.D.   On: 04/06/2023 08:59   DG Chest 2 View Result Date: 04/06/2023 CLINICAL DATA:  Suspected sepsis.  Weakness and confusion. EXAM: CHEST - 2 VIEW COMPARISON:  03/07/2023 FINDINGS: Shallow inspiration. Heart size and pulmonary vascularity are normal for technique. Lungs are clear. No pleural effusion or pneumothorax. Mediastinal contours appear intact. Calcification of the aorta. Surgical clips in the right upper quadrant. IMPRESSION: Shallow inspiration.  No evidence of active pulmonary disease. Electronically Signed   By: Burman Nieves M.D.   On: 04/06/2023 00:04    Labs: BNP (last 3 results) Recent Labs    08/30/22 0354 01/03/23 0616  BNP 614.9* 469.4*   Basic Metabolic Panel: Recent Labs  Lab 04/08/23 0431 04/09/23 0347 04/10/23 0327 04/12/23 0406 04/13/23 0351 04/14/23 0341  NA 136 139 138 139 135 140  K 3.4* 4.0 3.9 3.5 3.5 3.7  CL 113* 116* 112* 113* 109 111  CO2 17* 20* 21* 23 23 22   GLUCOSE 134* 108* 78 120* 109* 120*  BUN 27* 24* 19 20 20 20   CREATININE 1.28* 0.91 0.83 0.83 0.81 0.84  CALCIUM 7.7* 7.8* 7.9* 7.7* 7.6* 7.8*  MG 2.0 1.9  --   --   --   --     Liver Function Tests: Recent Labs  Lab 04/08/23 0431 04/09/23 0347  AST 49* 43*  ALT 26 28  ALKPHOS 61 59  BILITOT 1.4* 1.3*  PROT 4.5* 4.6*  ALBUMIN 2.0* 2.1*   No results for input(s): "LIPASE", "AMYLASE" in the last 168 hours. No results for input(s): "AMMONIA" in the last 168 hours. CBC: Recent Labs  Lab 04/08/23 0431 04/10/23 0327 04/12/23 0406  WBC 10.5 6.1 6.9  NEUTROABS 7.6 3.2 3.4  HGB 8.5* 9.1* 9.4*  HCT 28.7* 29.9* 30.6*  MCV 98.0 96.1 93.3  PLT 109* 108* 125*      Component Value Date/Time   COLORURINE YELLOW 03/29/2023 1651   APPEARANCEUR HAZY (A) 03/29/2023 1651   LABSPEC 1.025 03/29/2023 1651   PHURINE 5.0 03/29/2023 1651   GLUCOSEU NEGATIVE 03/29/2023 1651   HGBUR NEGATIVE 03/29/2023 1651   BILIRUBINUR NEGATIVE 03/29/2023 1651   KETONESUR NEGATIVE 03/29/2023 1651   PROTEINUR NEGATIVE 03/29/2023 1651   UROBILINOGEN 0.2 08/21/2013 0017   NITRITE NEGATIVE 03/29/2023 1651   LEUKOCYTESUR NEGATIVE 03/29/2023 1651   Sepsis Labs Recent Labs  Lab 04/08/23 0431 04/10/23 0327 04/12/23 0406  WBC 10.5 6.1 6.9   Microbiology Recent Results (from the past 240  hours)  Culture, blood (Routine x 2)     Status: Abnormal   Collection Time: 04/05/23 11:24 PM   Specimen: BLOOD  Result Value Ref Range Status   Specimen Description   Final    BLOOD LEFT ANTECUBITAL Performed at [facility], [address] [place], [place] [address]    Special Requests   Final    BOTTLES DRAWN AEROBIC AND ANAEROBIC Blood Culture adequate volume Performed at [facility], [address], [place], [place] [address]    Culture  Setup Time   Final    GRAM NEGATIVE RODS BOTTLES DRAWN AEROBIC ONLY CRITICAL RESULT CALLED TO, READ BACK BY AND VERIFIED WITH: Abe People [id number] @1953  FH Performed at [facility], [address], [place], [place] [address]    Culture FLAVOBACTERIUM ODORATUM (A)  Final   Report Status  04/08/2023 FINAL  Final   Organism ID, Bacteria FLAVOBACTERIUM ODORATUM  Final      Susceptibility   Flavobacterium odoratum - MIC*    CEFTAZIDIME >=64 RESISTANT Resistant     CIPROFLOXACIN >=4 RESISTANT Resistant     GENTAMICIN >=16 RESISTANT Resistant     IMIPENEM 2 SENSITIVE Sensitive     TRIMETH/SULFA >=320 RESISTANT Resistant     PIP/TAZO >=128 RESISTANT Resistant ug/mL    * FLAVOBACTERIUM ODORATUM  Blood Culture ID Panel (Reflexed)     Status: None   Collection Time: 04/05/23 11:24 PM  Result Value Ref Range Status   Enterococcus faecalis NOT DETECTED NOT DETECTED Final   Enterococcus Faecium NOT DETECTED NOT DETECTED Final   Listeria monocytogenes NOT DETECTED NOT DETECTED Final   Staphylococcus species NOT DETECTED NOT DETECTED Final   Staphylococcus aureus (BCID) NOT DETECTED NOT DETECTED Final   Staphylococcus epidermidis NOT DETECTED NOT DETECTED Final   Staphylococcus lugdunensis NOT DETECTED NOT DETECTED Final   Streptococcus species NOT DETECTED NOT DETECTED Final   Streptococcus agalactiae NOT DETECTED NOT DETECTED Final   Streptococcus pneumoniae NOT DETECTED NOT DETECTED Final   Streptococcus pyogenes NOT DETECTED NOT DETECTED Final   A.calcoaceticus-baumannii NOT DETECTED NOT DETECTED Final   Bacteroides fragilis NOT DETECTED NOT DETECTED Final   Enterobacterales NOT DETECTED NOT DETECTED Final   Enterobacter cloacae complex NOT DETECTED NOT DETECTED Final   Escherichia coli NOT DETECTED NOT DETECTED Final   Klebsiella aerogenes NOT DETECTED NOT DETECTED Final   Klebsiella oxytoca NOT DETECTED NOT DETECTED Final   Klebsiella pneumoniae NOT DETECTED NOT DETECTED Final   Proteus species NOT DETECTED NOT DETECTED Final   Salmonella species NOT DETECTED NOT DETECTED Final   Serratia marcescens NOT DETECTED NOT DETECTED Final   Haemophilus influenzae NOT DETECTED NOT DETECTED Final   Neisseria meningitidis NOT DETECTED NOT DETECTED Final   Pseudomonas aeruginosa NOT  DETECTED NOT DETECTED Final   Stenotrophomonas maltophilia NOT DETECTED NOT DETECTED Final   Candida albicans NOT DETECTED NOT DETECTED Final   Candida auris NOT DETECTED NOT DETECTED Final   Candida glabrata NOT DETECTED NOT DETECTED Final   Candida krusei NOT DETECTED NOT DETECTED Final   Candida parapsilosis NOT DETECTED NOT DETECTED Final   Candida tropicalis NOT DETECTED NOT DETECTED Final   Cryptococcus neoformans/gattii NOT DETECTED NOT DETECTED Final    Comment: Performed at [name], [address], [place], [place] [address]  Culture, blood (Routine x 2)     Status: Abnormal   Collection Time: 04/06/23 12:03 AM   Specimen: BLOOD LEFT FOREARM  Result Value Ref  Range Status   Specimen Description   Final    BLOOD LEFT FOREARM Performed at [facility], [address], [place], [place] [address]    Special Requests   Final    BOTTLES DRAWN AEROBIC AND ANAEROBIC Blood Culture results may not be optimal due to an inadequate volume of blood received in culture bottles Performed at [facility], [address] [place], [place] [address]    Culture  Setup Time   Final    GRAM NEGATIVE RODS BOTTLES DRAWN AEROBIC ONLY CRITICAL VALUE NOTED.  VALUE IS CONSISTENT WITH PREVIOUSLY REPORTED AND CALLED VALUE.    Culture (A)  Final    FLAVOBACTERIUM ODORATUM SUSCEPTIBILITIES PERFORMED ON PREVIOUS CULTURE WITHIN THE LAST 5 DAYS. Performed at [facility], [address], [place], [place] [address]    Report Status 04/08/2023 FINAL  Final     Time coordinating discharge: 35 minutes  SIGNED: Lanae Boast, MD  [facility] 04/14/2023, 10:41 AM  If 7PM-7AM, please contact night-coverage [email or web address]

## 2023-04-14 NOTE — TOC Transition Note (Signed)
 Transition of Care [facility]) - Discharge Note  Patient Details  Name: [name] MRN: [id number] Date of Birth: 11-13-1955  Transition of Care [facility]) CM/SW Contact:  Ewing Schlein, LCSW Phone Number: 04/14/2023, 11:18 AM  Clinical Narrative: HHPT/OT orders placed by hospitalist. CSW notified Amy with [facility] of orders and patient discharging home today. TOC signing off.  Final next level of care: Home w Home Health Services Barriers to Discharge: Barriers Resolved  Discharge Plan and Services Additional resources added to the After Visit Summary for   In-house Referral: Clinical Social Work Post Acute Care Choice: Home Health          DME Arranged: N/A DME Agency: NA HH Arranged: PT, OT HH Agency: [facility] Date HH Agency Contacted: 04/14/23 Time HH Agency Contacted: 1117 Representative spoke with at [facility] Agency: Amy  Social Drivers of Health (SDOH) Interventions SDOH Screenings   Food Insecurity: No Food Insecurity (04/06/2023)  Housing: Low Risk  (04/06/2023)  Transportation Needs: No Transportation Needs (04/06/2023)  Utilities: Not At Risk (04/06/2023)  Depression (PHQ2-9): Medium Risk (03/06/2023)  Social Connections: Socially Isolated (04/06/2023)  Tobacco Use: Medium Risk (04/06/2023)   Readmission Risk Interventions    04/07/2023    1:23 PM 03/09/2023   11:21 AM 10/19/2022   10:12 AM  Readmission Risk Prevention Plan  Transportation Screening Complete Complete Complete  Medication Review Oceanographer) Complete Complete Complete  PCP or Specialist appointment within 3-5 days of discharge  Complete Complete  HRI or Home Care Consult Complete Complete Complete  SW Recovery Care/Counseling Consult Complete Complete Complete  Palliative Care Screening Not Applicable Not Applicable Not Applicable  Skilled Nursing Facility Not Applicable Not Applicable Not Applicable

## 2023-04-17 ENCOUNTER — Telehealth: Payer: Self-pay | Admitting: *Deleted

## 2023-04-17 DIAGNOSIS — L03115 Cellulitis of right lower limb: Secondary | ICD-10-CM

## 2023-04-17 NOTE — Transitions of Care (Post Inpatient/ED Visit) (Signed)
 04/17/2023  Name: [name] MRN: [id number] DOB: 12/07/1955  Today's TOC FU Call Status: Today's TOC FU Call Status:: Successful TOC FU Call Completed TOC FU Call Complete Date: 04/17/23 Patient's Name and Date of Birth confirmed.  Transition Care Management Follow-up Telephone Call Date of Discharge: 04/14/23 Discharge Facility: [facility]) Type of Discharge: Inpatient Admission Primary Inpatient Discharge Diagnosis:: Sepsis How have you been since you were released from the hospital?: Better Any questions or concerns?: Yes Patient Questions/Concerns:: Caregiver has questions about medicaid Patient Questions/Concerns Addressed: Other: (referred to Child psychotherapist)  Items Reviewed: Did you receive and understand the discharge instructions provided?: Yes Medications obtained,verified, and reconciled?: Yes (Medications Reviewed) Any new allergies since your discharge?: No Dietary orders reviewed?: Yes Type of Diet Ordered:: low sodium heart healthy Do you have support at home?: Yes People in Home [RPT]: child(ren), adult Name of Support/Comfort Primary Source: [name]  Medications Reviewed Today: Medications Reviewed Today     Reviewed by Luella Cook, RN (Case Manager) on 04/17/23 at 1057  Med List Status: <None>   Medication Order Taking? Sig Documenting Provider Last Dose Status Informant  albuterol (VENTOLIN HFA) 108 (90 Base) MCG/ACT inhaler [id number] Yes Inhale 2 puffs into the lungs every 6 (six) hours as needed for shortness of breath or wheezing. [provider] Taking Active Child, Pharmacy Records  atorvastatin (LIPITOR) 40 MG tablet [id number] Yes Take 1 tablet (40 mg total) by mouth in the morning. [name], MD Taking Active Child, Pharmacy Records  cefadroxil (DURICEF) 500 MG capsule [id number] Yes Take 2 capsules (1,000 mg total) by mouth 2 (two) times daily for 7 days. Lanae Boast, MD Taking Active   copper tablet [id number] Yes Take 2 mg by  mouth in the morning and at bedtime. [provider] Taking Active Child, Pharmacy Records  cyanocobalamin (VITAMIN B12) 1000 MCG tablet [id number] Yes Take 2,000 mcg by mouth every Monday, Wednesday, and Friday. [provider] Taking Active Pharmacy Records, Child  dicyclomine (BENTYL) 10 MG capsule [id number] Yes Take 1 capsule (10 mg total) by mouth 3 (three) times daily as needed for spasms (abdominal cramping). Lewie Chamber, MD Taking Active Pharmacy Records, Child  famotidine (PEPCID) 20 MG tablet [id number] Yes Take 20 mg by mouth in the morning. [provider] Taking Active Pharmacy Records, Child  folic acid (FOLVITE) 1 MG tablet [id number] Yes Take 1 tablet (1 mg total) by mouth daily. Calvert Cantor, MD Taking Active Pharmacy Records, Child  furosemide (LASIX) 20 MG tablet [id number] Yes Take 3 tablets (60mg ) daily for 1 week,  then go back to home dose of 2 tablets (40 mg) daily. Take additional 2 tablets (40 mg) if there is a weight gain of 3 lbs in1 day or 5 lbs in 1 week. Lanae Boast, MD Taking Active   HYDROcodone-acetaminophen [facility]) 10-325 MG tablet [id number] Yes Take 1 tablet by mouth every 8 (eight) hours as needed for moderate pain (pain score 4-6). [provider] Taking Active Pharmacy Records, Child  Barkley Bruns [id number] Yes Take 1 tablet by mouth daily with breakfast. [provider] Taking Active Pharmacy Records, Child  lactulose (CHRONULAC) 10 GM/15ML solution [id number] Yes Take 30 mLs (20 g total) by mouth 3 (three) times daily. Take to ensure that there is 2 loose bowel movement EVERY DAY  Patient taking differently: Take 30 mLs by mouth 3 (three) times daily as needed (to ensure there are 2 loose bowel movements EVERY DAY).   Rolly Salter, MD  Taking Active Pharmacy Records, Child  levothyroxine (SYNTHROID) 175 MCG tablet [id number] Yes Take 1 tablet (175 mcg total) by mouth daily at 6 (six) AM. Recheck TSH with PCP in 4  weeks Debarah Crape, DO Taking Active Child, Pharmacy Records  lidocaine (LIDODERM) 5 % [id number] Yes Place 1 patch onto the skin daily. Remove & Discard patch within 12 hours or as directed by MD  Patient taking differently: Place 1 patch onto the skin daily as needed (for pain- Remove & Discard patch within 12 hours or as directed by MD).   Rolly Salter, MD Taking Active Pharmacy Records, Child           Med Note Patience Musca   NWG Dec 10, 2022 11:45 AM)    magnesium gluconate (MAGONATE) 500 (27 Mg) MG TABS tablet [id number] Yes Take 500 mg by mouth in the morning and at bedtime. [provider] Taking Active Child, Pharmacy Records  megestrol (MEGACE) 40 MG tablet [id number] Yes Take 1 tablet (40 mg total) by mouth 2 (two) times daily. Standley Brooking, MD Taking Active Child, Pharmacy Records           Med Note Jola Schmidt   Thu Apr 06, 2023  8:00 AM) Dispense record does not support.   methocarbamol (ROBAXIN) 500 MG tablet [id number] Yes Take 500 mg by mouth in the morning. [provider] Taking Active Pharmacy Records, Child  midodrine (PROAMATINE) 10 MG tablet [id number] Yes Take 1 tablet (10 mg total) by mouth 3 (three) times daily with meals. Lewie Chamber, MD Taking Active Pharmacy Records, Child  Multiple Vitamins-Minerals (CENTRUM Lyman Bishop ULTRA WOMENS PO) [id number] Yes Take 1 tablet by mouth in the morning. [provider] Taking Active Child, Pharmacy Records  naloxone [facility]) nasal spray 4 mg/0.1 mL [id number] Yes Place 1 spray into the nose as needed (accidental overdose). [provider] Taking Active Pharmacy Records, Child           Med Note Kandis Cocking [place], [place] A   Wed Mar 08, 2023  2:39 PM)    nystatin cream (MYCOSTATIN) [id number] Yes Apply topically 2 (two) times daily.  Patient taking differently: Apply 1 Application topically 2 (two) times daily as needed for dry skin.   Standley Brooking, MD Taking Active Child,  Pharmacy Records  pantoprazole (PROTONIX) 40 MG tablet [id number] Yes Take 1 tablet (40 mg total) by mouth 2 (two) times daily before a meal. Lewie Chamber, MD Taking Active Pharmacy Records, Child  potassium chloride (KLOR-CON) 10 MEQ tablet [id number] Yes Take 20 mEq by mouth 2 (two) times daily. [provider] Taking Active Child, Pharmacy Records  promethazine (PHENERGAN) 12.5 MG tablet [id number] Yes Take 12.5 mg by mouth every 6 (six) hours as needed for nausea or vomiting. [provider] Taking Active Pharmacy Records, Child           Med Note Kandis Cocking [place], [place] A   Wed Mar 08, 2023  2:39 PM)    rifaximin (XIFAXAN) 550 MG TABS tablet [id number] Yes Take 550 mg by mouth 2 (two) times daily. [provider] Taking Active Pharmacy Records, Child  rOPINIRole (REQUIP) 0.25 MG tablet [id number] Yes Take 1 tablet (0.25 mg total) by mouth at bedtime. Lewie Chamber, MD Taking Active Pharmacy Records, Child  spironolactone (ALDACTONE) 50 MG tablet [id number] Yes Take 1 tablet (50 mg total) by mouth daily. [name], MD Taking Active Child, Pharmacy Records  tamsulosin [facility]) 0.4 MG CAPS capsule [id number] Yes Take 1  capsule (0.4 mg total) by mouth daily. Lewie Chamber, MD Taking Active Pharmacy Records, Child           Med Note Laray Anger Apr 06, 2023  8:01 AM) Dispense record does not support.   triamcinolone cream (KENALOG) 0.5 % [id number] Yes Apply to affected area of right breast twice daily as directed. Osvaldo Shipper, MD Taking Active Pharmacy Records, Child  venlafaxine XR [facility]) 75 MG 24 hr capsule [id number] Yes Take 75 mg by mouth daily with breakfast. [provider] Taking Active Pharmacy Records, Child           Med Note Jola Schmidt   Thu Apr 06, 2023  8:01 AM) Dispense record does not support.   Vitamin D, Ergocalciferol, (DRISDOL) 50000 UNITS CAPS [id number] Yes Take 50,000 Units by mouth every Wednesday. [provider] Taking Active Pharmacy Records, Child  Med List Note Kandis Cocking Eulis Foster 03/08/23 1450): Pt's son Tiann Saha helps with medications            Home Care and Equipment/Supplies: Were Home Health Services Ordered?: Yes Name of Home Health Agency:: [facility] Has Agency set up a time to come to your home?: No EMR reviewed for Home Health Orders: Orders present/patient has not received call (refer to CM for follow-up) Any new equipment or medical supplies ordered?: NA  Functional Questionnaire: Do you need assistance with bathing/showering or dressing?: No Do you need assistance with meal preparation?: No Do you need assistance with eating?: No Do you have difficulty maintaining continence: No Do you need assistance with getting out of bed/getting out of a chair/moving?: Yes Do you have difficulty managing or taking your medications?: No  Follow up appointments reviewed: PCP Follow-up appointment confirmed?: Yes Date of PCP follow-up appointment?: 04/24/23 Follow-up Provider: Gifford Shave Follow-up appointment confirmed?: NA Do you need transportation to your follow-up appointment?: No Do you understand care options if your condition(s) worsen?: Yes-patient verbalized understanding  SDOH Interventions Today    Flowsheet Row Most Recent Value  SDOH Interventions   Food Insecurity Interventions Intervention Not Indicated  Housing Interventions Intervention Not Indicated  Transportation Interventions Intervention Not Indicated, Patient Resources (Friends/Family)  Utilities Interventions Intervention Not Indicated       Goals Addressed             This Visit's Progress    VBCI Transitions of Care (TOC) Care Plan       Problems:  Recent Hospitalization for treatment of  sepsis and cellulitis of right thigh No Hospital Follow Up Provider appointment Care guide made appointment with PCP [phone]  Goal:  Over the next  30 days, the patient will not experience hospital readmission  Interventions:  Transitions of Care:  New goal. Doctor Visits  - discussed the importance of doctor visits Arranged PCP follow-up within 12-14 days (Care Guide Scheduled)  Patient Self Care Activities:  Attend all scheduled provider appointments Call pharmacy for medication refills 3-7 days in advance of running out of medications Call provider office for new concerns or questions  Take medications as prescribed   Work with the social worker to address care coordination needs and will continue to work with the clinical team to address health care and disease management related needs  Plan:  Next PCP appointment scheduled for: [id number] 10:00        Gean Maidens BSN RN [facility] Care Management Coordinator [email or web address] Direct Dial: [phone]  Fax: [phone]  Website: [email or web address]

## 2023-04-18 ENCOUNTER — Telehealth: Payer: Self-pay

## 2023-04-18 ENCOUNTER — Telehealth: Payer: Self-pay | Admitting: *Deleted

## 2023-04-18 NOTE — Progress Notes (Signed)
 Complex Care Management Note  Care Guide Note 04/18/2023 Name: [name] MRN: [id number] DOB: 05/23/1955  [name] is a 68 y.o. year old female who sees [name], MD for primary care. I reached out to [name] son [name] by phone today to offer complex care management services.  [name] son [name] was given information about Complex Care Management services today including:   The Complex Care Management services include support from the care team which includes your Nurse Care Manager, Clinical Social Worker, or Pharmacist.  The Complex Care Management team is here to help remove barriers to the health concerns and goals most important to you. Complex Care Management services are voluntary, and the patient may decline or stop services at any time by request to their care team member.   Complex Care Management Consent Status: [name]  agreed to services and verbal consent obtained.   Follow up plan:  Telephone appointment with complex care management team member scheduled for:  4/18  Encounter Outcome:  Patient Scheduled  Gwenevere Ghazi  [facility]  [facility] Guide  Direct Dial: [phone]  Fax [phone]

## 2023-04-18 NOTE — Telephone Encounter (Signed)
 Verbal order okay given to Christy to r/s appointment to Friday. Mjp,lpn  Copied from CRM [id number]. Topic: Clinical - Home Health Verbal Orders >> Apr 18, 2023  8:40 AM Franchot Heidelberg wrote: Caller/Agency: Christy from [facility]  Callback Number: [phone] Service Requested: Occupational Therapy and Physical Therapy Frequency:   Pt needs to reschedule her appt today to Friday because of appt conflicts

## 2023-04-19 DIAGNOSIS — K7460 Unspecified cirrhosis of liver: Secondary | ICD-10-CM | POA: Diagnosis not present

## 2023-04-19 DIAGNOSIS — K7290 Hepatic failure, unspecified without coma: Secondary | ICD-10-CM | POA: Diagnosis not present

## 2023-04-20 ENCOUNTER — Other Ambulatory Visit: Payer: Self-pay | Admitting: Family Medicine

## 2023-04-20 NOTE — Telephone Encounter (Signed)
 Copied from CRM [id number]. Topic: Clinical - Medication Refill >> Apr 20, 2023 12:58 PM Shon Hale wrote: Most Recent Primary Care Visit:  Provider: [name]  Department: [facility]  Visit Type: NEW PT - OFFICE VISIT  Date: 03/06/2023  Medication: venlafaxine XR (EFFEXOR-XR) 75 MG 24 hr capsule  Has the patient contacted their pharmacy? Yes Told it was sent to Dr. Riley Nearing a couple days ago. Son concerned it was sent to office at [facility], has had same issue before.   Is this the correct pharmacy for this prescription? Yes  This is the patient's preferred pharmacy:  [id number], [place] - [address] AT [facility] [address] [place] [address] Phone: [phone] Fax: [phone]  Has the prescription been filled recently? No  Is the patient out of the medication? Yes, has been out for two days.  Has the patient been seen for an appointment in the last year OR does the patient have an upcoming appointment? Yes  Can we respond through MyChart? Yes  Agent: Please be advised that Rx refills may take up to 3 business days. We ask that you follow-up with your pharmacy.

## 2023-04-21 ENCOUNTER — Other Ambulatory Visit (HOSPITAL_COMMUNITY): Payer: Self-pay

## 2023-04-21 ENCOUNTER — Other Ambulatory Visit: Payer: Self-pay

## 2023-04-21 MED ORDER — VENLAFAXINE HCL ER 75 MG PO CP24
75.0000 mg | ORAL_CAPSULE | Freq: Every day | ORAL | 0 refills | Status: DC
  Filled 2023-04-21: qty 90, 90d supply, fill #0

## 2023-04-21 NOTE — Telephone Encounter (Signed)
 Requested medications are due for refill today.  unsure  Requested medications are on the active medications list.  Yes as historical  Last refill. 06/05/2011  Future visit scheduled.   yes  Notes to clinic.  Refill not delegated. Labs are expired. Medication is historical.    Requested Prescriptions  Pending Prescriptions Disp Refills   Vitamin D, Ergocalciferol, (DRISDOL) 1.25 MG (50000 UNIT) CAPS capsule 5 capsule     Sig: Take 1 capsule (50,000 Units total) by mouth every Wednesday.     Endocrinology:  Vitamins - Vitamin D Supplementation 2 Failed - 04/21/2023 10:56 AM      Failed - Manual Review: Route requests for 50,000 IU strength to the provider      Failed - Ca in normal range and within 360 days    Calcium  Date Value Ref Range Status  04/14/2023 7.8 (L) 8.9 - 10.3 mg/dL Final   Calcium, Ion  Date Value Ref Range Status  09/30/2022 1.09 (L) 1.15 - 1.40 mmol/L Final         Failed - Vitamin D in normal range and within 360 days    No results found for: "WJ1914NW2", "NF6213YQ6", "VH846NG2XBM", "25OHVITD3", "25OHVITD2", "25OHVITD1", "VD25OH"       Failed - Valid encounter within last 12 months    Recent Outpatient Visits           1 month ago [place]   [facility] [name], MD

## 2023-04-24 ENCOUNTER — Encounter: Payer: Self-pay | Admitting: Family Medicine

## 2023-04-24 ENCOUNTER — Ambulatory Visit: Payer: Medicare (Managed Care) | Admitting: Family Medicine

## 2023-04-24 VITALS — BP 124/78 | HR 84 | Temp 98.5°F | Ht 62.0 in | Wt 229.4 lb

## 2023-04-24 DIAGNOSIS — Z79899 Other long term (current) drug therapy: Secondary | ICD-10-CM

## 2023-04-24 DIAGNOSIS — E039 Hypothyroidism, unspecified: Secondary | ICD-10-CM | POA: Diagnosis not present

## 2023-04-24 DIAGNOSIS — R600 Localized edema: Secondary | ICD-10-CM

## 2023-04-24 DIAGNOSIS — A419 Sepsis, unspecified organism: Secondary | ICD-10-CM | POA: Diagnosis not present

## 2023-04-24 DIAGNOSIS — Z23 Encounter for immunization: Secondary | ICD-10-CM | POA: Diagnosis not present

## 2023-04-24 DIAGNOSIS — L03115 Cellulitis of right lower limb: Secondary | ICD-10-CM

## 2023-04-24 DIAGNOSIS — K7460 Unspecified cirrhosis of liver: Secondary | ICD-10-CM | POA: Diagnosis not present

## 2023-04-24 DIAGNOSIS — D638 Anemia in other chronic diseases classified elsewhere: Secondary | ICD-10-CM | POA: Diagnosis not present

## 2023-04-24 DIAGNOSIS — E610 Copper deficiency: Secondary | ICD-10-CM | POA: Diagnosis not present

## 2023-04-24 MED ORDER — FUROSEMIDE 40 MG PO TABS: 40.0000 mg | ORAL_TABLET | Freq: Two times a day (BID) | ORAL | 1 refills | Status: DC

## 2023-04-24 NOTE — Progress Notes (Unsigned)
 Patient Office Visit  Assessment & Plan:  Edema of both lower legs -     Furosemide; Take 1 tablet (40 mg total) by mouth 2 (two) times daily.  Dispense: 60 tablet; Refill: 1 -     CBC with Differential/Platelet -     COMPLETE METABOLIC PANEL WITHOUT GFR -     Magnesium  Hepatic cirrhosis, unspecified hepatic cirrhosis type, unspecified whether ascites present (HCC)  Anemia of chronic disease -     CBC with Differential/Platelet  Polypharmacy  Copper deficiency  Immunization due -     Heplisav-B (HepB-CPG) Vaccine -     Hepatitis A vaccine adult IM  Hypothyroidism, unspecified type -     TSH  Cellulitis of right lower extremity   Test results were reviewed and analyzed as part of the medical decision making of this visit.  Reviewed previous hospitalization at [facility] notes, consultations, lab work and discharge summary. Hepatitis B vaccine #2 and Hepatitis A #2 given today.  Patient needs to get the third hepatitis B in a few months.  Follow-up on lab work and notify patient.  Patient will take the potassium while she is on the Lasix.  Daily weights recommended.  Return in about 3 months (around 07/24/2023), or if symptoms worsen or fail to improve.   Subjective:    Patient ID: [name], female    DOB: 01/11/1955  Age: 68 y.o. MRN: [id number]  No chief complaint on file.   HPI Patient is here for hospital follow-up and follow-up on chronic medical issues.  Patient was admitted March 27 and discharged April 4. Sepsis due to right innner thigh cellulitis- pt was hospitalized for 11 days at [facility]. Pt is feeling much better.  EDEMA-Patient complains of bilateral leg swelling.  ONSET: ongoing, moderate-severe but improved per patient and son TIMING: ongoing Improving overall since hospital discharge but still has thigh and lower leg swelling. Pt was told to increase her Lasix.  CHARACTER: Tense RELIEF FROM: Lasix 40mg  twice per day.  HOME  TREATMENT: Lasix 40mg  about twice per day  ASSOCIATED SYMPTOMS: Subjective Fever (No), Chills (No), Pain (No), Redness (No), Drainage (No), Shortness of breath (No), Chest pain (No), Weeping (No) RISK FACTORS: -- Hx of CHF (hx of diastolic dysfunction) -- Hx of renal disease (No) -- Hx of hypertension (Yes but now has low BP) -- Hx of venous insufficiency (yes) -- Hx of liver disease (yes, cirrhosis) -- Hx of cancer (No) -- Obesity (yes) -- Trauma (No) -- Smoking (no) -- New Medication (no) Copper deficiency- pt still has to do 24 hour urine collection for this.  Cirrhosis- pt still taking Lactulose. Pt is followed by GI. Pt needs to complete HepA/Hep B immunizations. Pt received one of each  Hypothyroidism- pt taking her med 175 micrograms daily.  Patient has gained weight since last office visit however she thinks it is due to fluid not to thyroid.  Patient will be increasing the Lasix.  Patient also needs to reduce her salt intake. 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   pt stated on admission history review   Corneal perforation of left eye 04/18/2022  Depression, major, recurrent, moderate (HCC) 09/10/2018   DVT (deep venous thrombosis) (HCC) 01/10/1997   left      left     Heart murmur    Hepatic encephalopathy (HCC) 12/10/2022   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   Hyperammonemia (HCC) 09/30/2022   Hypertension    Kidney stone    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   Obesity    Pulmonary emboli (HCC) 01/10/2009   Pulmonary embolism (HCC)    Short gut syndrome    Thyroid disease    Past  Surgical History:  Procedure Laterality Date   ABDOMINAL WALL MESH  REMOVAL     2016   CESAREAN SECTION WITH BILATERAL TUBAL LIGATION  04/30/1985   CHOLECYSTECTOMY  05/2007   Exploratory laparotomy, lysis of adhesions, takedown of ileostomy with small bowel resection, open cholecystectomy and ileocolostomy   COLON SURGERY  01/2007   exploratory laparotomy with right colecotmy and end ileostomy   COLONOSCOPY  04/02/2019   Dr Lora Robinsons Ardeth Krabbe, MD [facility]   ESOPHAGOGASTRODUODENOSCOPY (EGD) WITH PROPOFOL N/A 10/19/2022   Procedure: ESOPHAGOGASTRODUODENOSCOPY (EGD) WITH PROPOFOL;  Surgeon: Genell Ken, MD;  Location: [facility];  Service: Gastroenterology;  Laterality: N/A;   ESOPHAGOSCOPY  04/02/2019   EYE SURGERY Left 10/06/2020   Cataract extraciton w/intraocular lens implant- Dr Lunda Salines   HERNIA REPAIR  10/2007   ILEOSTOMY  2009   states bowel perfotation wih colonsocopy   ILEOSTOMY CLOSURE  2009   INCISIONAL HERNIA REPAIR  10/11/2018   IR KYPHO THORACIC WITH BONE BIOPSY  08/31/2022   IR KYPHO THORACIC WITH BONE BIOPSY  10/27/2022   IR RADIOLOGIST EVAL & MGMT  09/27/2022   KIDNEY STONE SURGERY     LIVER RESECTION     2009   PANNICULECTOMY     repair of abdominal wall hernia   WRIST SURGERY     tendonitis   Social History   Tobacco Use   Smoking status: Former    Current packs/day: 0.00    Types: Cigarettes    Quit date: 1987    Years since quitting: 38.3    Passive exposure: Past   Smokeless tobacco: Never  Vaping Use   Vaping status: Never Used  Substance Use Topics   Alcohol use: No   Drug use: No   Family History  Problem Relation Age of Onset   Uterine cancer Mother    Bone cancer Father    Heart disease Father    Hyperlipidemia Father    Dementia Father    Hypertension Father    Skin cancer Sister    Hypertension Sister    Diabetes Sister    Hyperlipidemia Brother    Hypertension Brother    Heart disease Brother    Allergies  Allergen Reactions    Ace Inhibitors Anaphylaxis and Swelling   Ivp Dye [Iodinated Contrast Media] Anaphylaxis   Desitin [Zinc Oxide] Rash and Other (See Comments)    Worsens rash   Gadolinium Derivatives Other (See Comments)    Reaction??   Hydroxyzine Other (See Comments)    Affects the mind   Chlorhexidine Rash and Other (See Comments)    WORSENS RASHES   Doxycycline Rash   Penicillins Rash   Zofran [Ondansetron] Rash    ROS    Objective:    BP 124/78   Pulse 84   Temp 98.5 F (36.9 C)   Ht 5\' 2"  (1.575 m)   Wt 229 lb 6 oz (104 kg)  SpO2 99%   BMI 41.95 kg/m  BP Readings from Last 3 Encounters:  04/24/23 124/78  04/14/23 (!) 112/56  04/03/23 (!) 122/55   Wt Readings from Last 3 Encounters:  04/24/23 229 lb 6 oz (104 kg)  04/05/23 208 lb 8.9 oz (94.6 kg)  03/29/23 208 lb 8.9 oz (94.6 kg)    Physical Exam Vitals and nursing note reviewed.  Constitutional:      General: She is not in acute distress.    Appearance: Normal appearance.     Comments: Patient comes in with her son. Pt is able to stand up from a sitting position without assistance.   HENT:     Head: Normocephalic.     Right Ear: Tympanic membrane, ear canal and external ear normal.     Left Ear: Tympanic membrane, ear canal and external ear normal.  Eyes:     Extraocular Movements: Extraocular movements intact.     Conjunctiva/sclera: Conjunctivae normal.     Pupils: Pupils are equal, round, and reactive to light.  Cardiovascular:     Rate and Rhythm: Normal rate and regular rhythm.     Heart sounds: Normal heart sounds.  Pulmonary:     Effort: Pulmonary effort is normal.     Breath sounds: Normal breath sounds.  Musculoskeletal:     Right lower leg: Edema present.     Left lower leg: Edema present.     Comments: Patient has edema up to both thighs bilaterally. No skin breakdown   Skin:    Findings: No rash.  Neurological:     General: No focal deficit present.     Mental Status: She is alert and oriented to  person, place, and time.     Gait: Gait normal.  Psychiatric:        Mood and Affect: Mood normal.        Behavior: Behavior normal.        Thought Content: Thought content normal.        Judgment: Judgment normal.      Results for orders placed or performed in visit on 04/24/23  CBC with Differential/Platelet  Result Value Ref Range   WBC 5.7 3.8 - 10.8 Thousand/uL   RBC 3.29 (L) 3.80 - 5.10 Million/uL   Hemoglobin 9.7 (L) 11.7 - 15.5 g/dL   HCT 16.1 (L) 09.6 - 04.5 %   MCV 93.0 80.0 - 100.0 fL   MCH 29.5 27.0 - 33.0 pg   MCHC 31.7 (L) 32.0 - 36.0 g/dL   RDW 40.9 (H) 81.1 - 91.4 %   Platelets 158 140 - 400 Thousand/uL   MPV 11.5 7.5 - 12.5 fL   Neutro Abs 3,209 1,500 - 7,800 cells/uL   Absolute Lymphocytes 1,562 850 - 3,900 cells/uL   Absolute Monocytes 485 200 - 950 cells/uL   Eosinophils Absolute 405 15 - 500 cells/uL   Basophils Absolute 40 0 - 200 cells/uL   Neutrophils Relative % 56.3 %   Total Lymphocyte 27.4 %   Monocytes Relative 8.5 %   Eosinophils Relative 7.1 %   Basophils Relative 0.7 %  COMPLETE METABOLIC PANEL WITHOUT GFR  Result Value Ref Range   Glucose, Bld 88 65 - 99 mg/dL   BUN 20 7 - 25 mg/dL   Creat 7.82 9.56 - 2.13 mg/dL   BUN/Creatinine Ratio SEE NOTE: 6 - 22 (calc)   Sodium 139 135 - 146 mmol/L   Potassium 4.8 3.5 - 5.3 mmol/L   Chloride 111 (H) 98 -  110 mmol/L   CO2 21 20 - 32 mmol/L   Calcium 8.5 (L) 8.6 - 10.4 mg/dL   Total Protein 5.6 (L) 6.1 - 8.1 g/dL   Albumin 2.8 (L) 3.6 - 5.1 g/dL   Globulin 2.8 1.9 - 3.7 g/dL (calc)   AG Ratio 1.0 1.0 - 2.5 (calc)   Total Bilirubin 1.7 (H) 0.2 - 1.2 mg/dL   Alkaline phosphatase (APISO) 85 37 - 153 U/L   AST 31 10 - 35 U/L   ALT 24 6 - 29 U/L  Magnesium  Result Value Ref Range   Magnesium 1.8 1.5 - 2.5 mg/dL  TSH  Result Value Ref Range   TSH 7.61 (H) 0.40 - 4.50 mIU/L

## 2023-04-25 ENCOUNTER — Encounter: Payer: Self-pay | Admitting: Family Medicine

## 2023-04-25 LAB — CBC WITH DIFFERENTIAL/PLATELET
Absolute Lymphocytes: 1562 {cells}/uL (ref 850–3900)
Absolute Monocytes: 485 {cells}/uL (ref 200–950)
Basophils Absolute: 40 {cells}/uL (ref 0–200)
Basophils Relative: 0.7 %
Eosinophils Absolute: 405 {cells}/uL (ref 15–500)
Eosinophils Relative: 7.1 %
HCT: 30.6 % — ABNORMAL LOW (ref 35.0–45.0)
Hemoglobin: 9.7 g/dL — ABNORMAL LOW (ref 11.7–15.5)
MCH: 29.5 pg (ref 27.0–33.0)
MCHC: 31.7 g/dL — ABNORMAL LOW (ref 32.0–36.0)
MCV: 93 fL (ref 80.0–100.0)
MPV: 11.5 fL (ref 7.5–12.5)
Monocytes Relative: 8.5 %
Neutro Abs: 3209 {cells}/uL (ref 1500–7800)
Neutrophils Relative %: 56.3 %
Platelets: 158 10*3/uL (ref 140–400)
RBC: 3.29 10*6/uL — ABNORMAL LOW (ref 3.80–5.10)
RDW: 15.1 % — ABNORMAL HIGH (ref 11.0–15.0)
Total Lymphocyte: 27.4 %
WBC: 5.7 10*3/uL (ref 3.8–10.8)

## 2023-04-25 LAB — COMPLETE METABOLIC PANEL WITHOUT GFR
AG Ratio: 1 (calc) (ref 1.0–2.5)
ALT: 24 U/L (ref 6–29)
AST: 31 U/L (ref 10–35)
Albumin: 2.8 g/dL — ABNORMAL LOW (ref 3.6–5.1)
Alkaline phosphatase (APISO): 85 U/L (ref 37–153)
BUN: 20 mg/dL (ref 7–25)
CO2: 21 mmol/L (ref 20–32)
Calcium: 8.5 mg/dL — ABNORMAL LOW (ref 8.6–10.4)
Chloride: 111 mmol/L — ABNORMAL HIGH (ref 98–110)
Creat: 1.03 mg/dL (ref 0.50–1.05)
Globulin: 2.8 g/dL (ref 1.9–3.7)
Glucose, Bld: 88 mg/dL (ref 65–99)
Potassium: 4.8 mmol/L (ref 3.5–5.3)
Sodium: 139 mmol/L (ref 135–146)
Total Bilirubin: 1.7 mg/dL — ABNORMAL HIGH (ref 0.2–1.2)
Total Protein: 5.6 g/dL — ABNORMAL LOW (ref 6.1–8.1)

## 2023-04-25 LAB — TSH: TSH: 7.61 m[IU]/L — ABNORMAL HIGH (ref 0.40–4.50)

## 2023-04-25 LAB — MAGNESIUM: Magnesium: 1.8 mg/dL (ref 1.5–2.5)

## 2023-04-26 ENCOUNTER — Encounter: Payer: Self-pay | Admitting: Family Medicine

## 2023-04-26 IMAGING — CR DG CHEST 2V
2 series · 2 of 2 positions shown · non-contrast
Comparison: Chest x-ray 09/10/2020

CLINICAL DATA: Shortness of breath.

EXAM:
CHEST - 2 VIEW

[w chest pa]
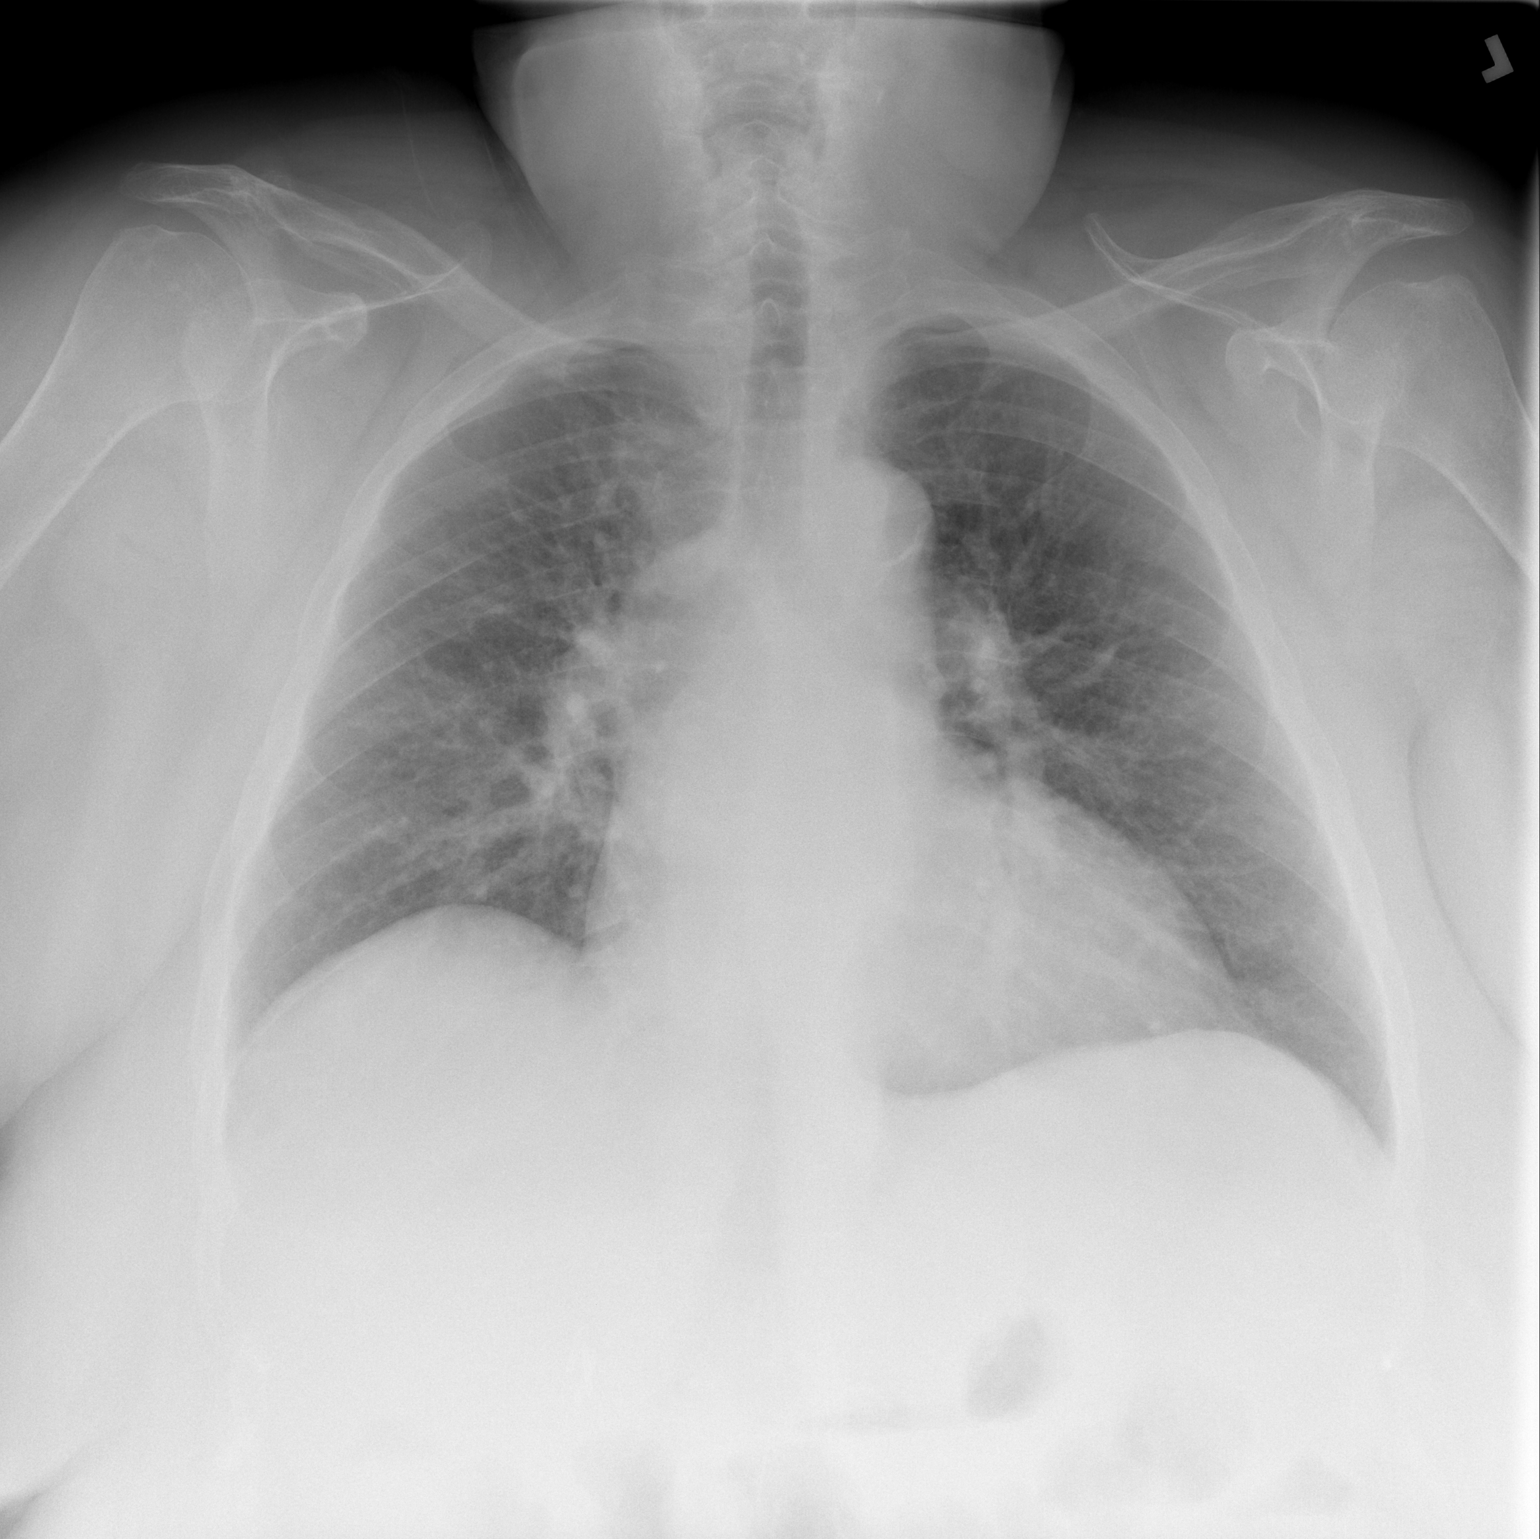

[w chest lat *]
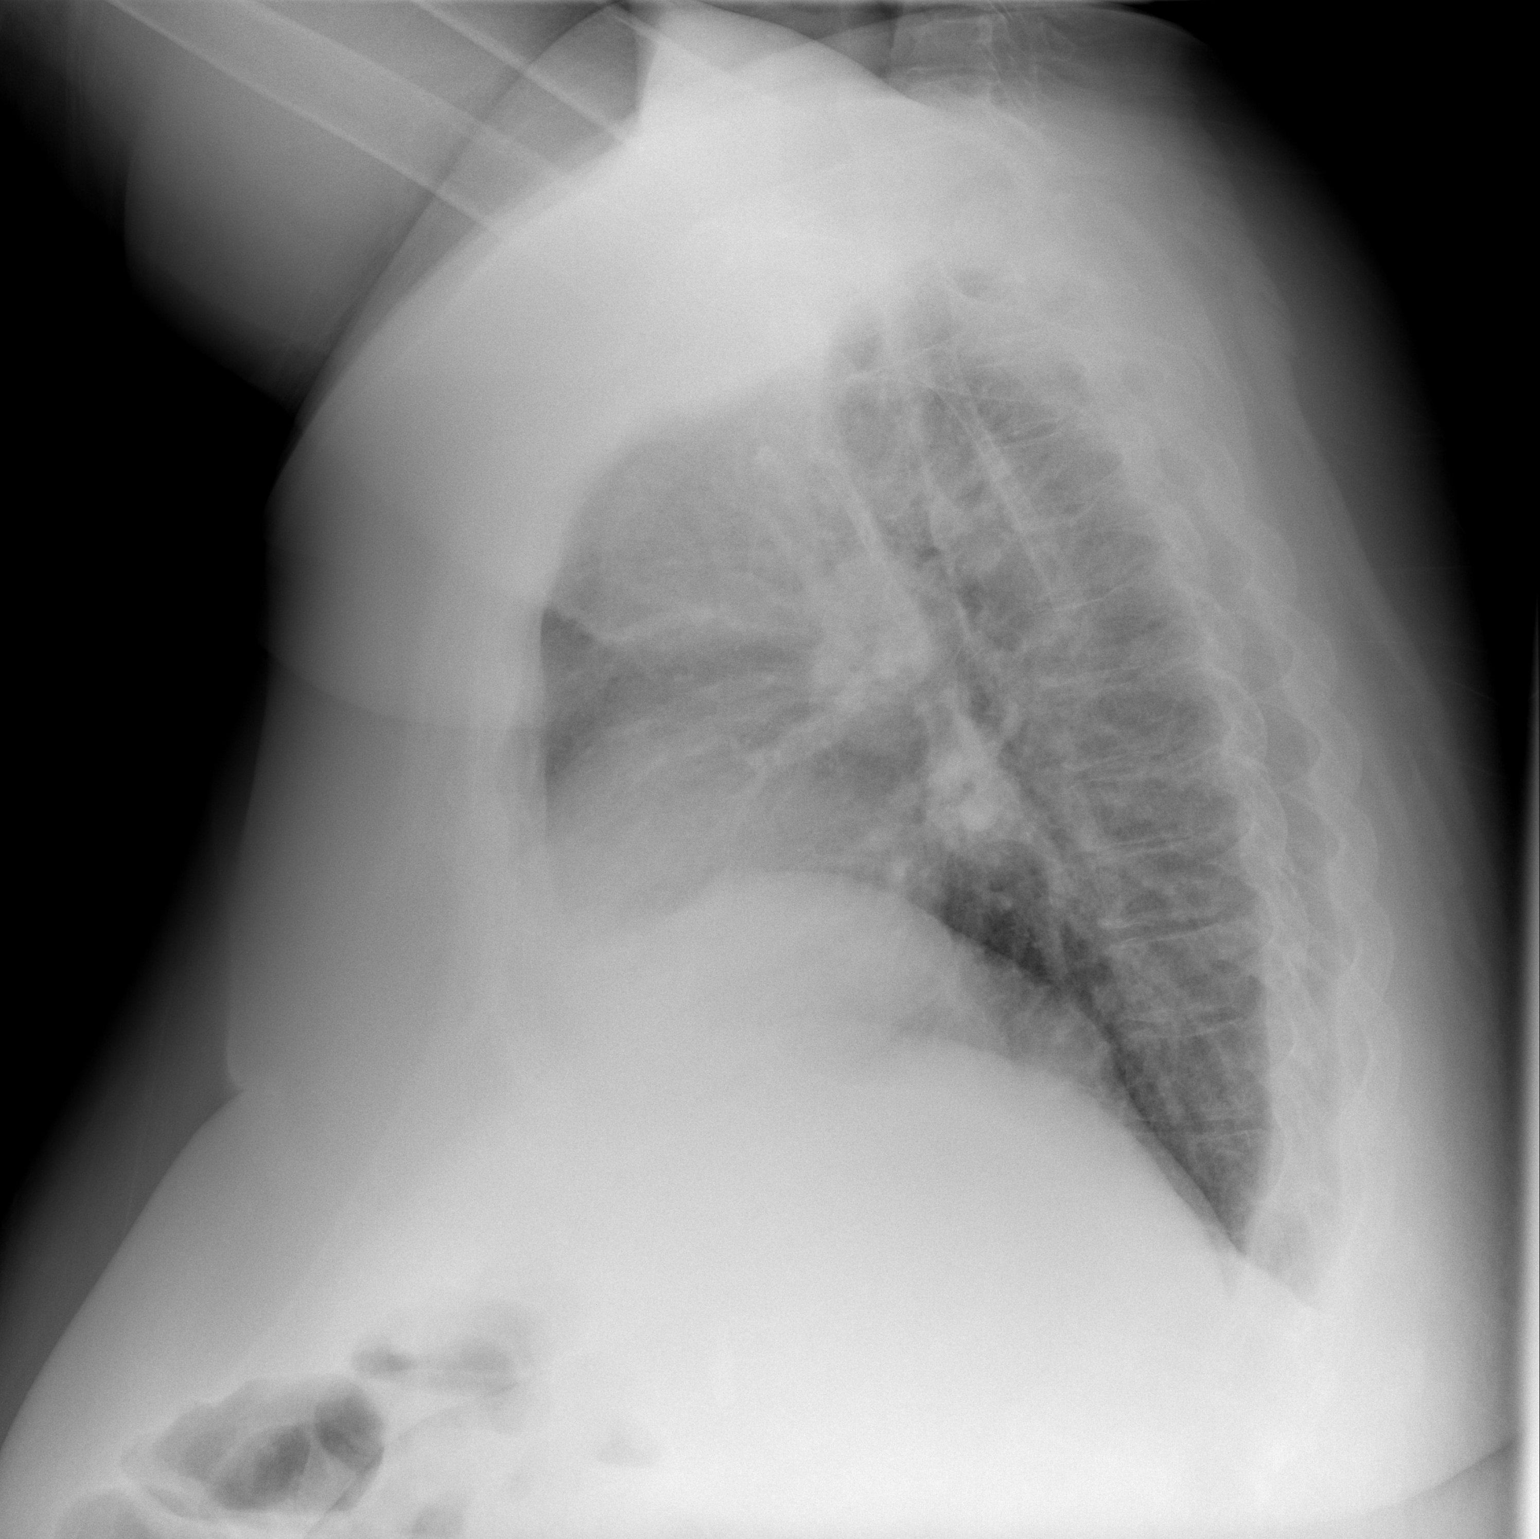

[2 of 2 positions shown; findings below may reference images not displayed]

FINDINGS: There is some patchy airspace opacity in the medial right upper
lobe. The lungs otherwise appear clear. Heart is borderline
enlarged. There is no pleural effusion or pneumothorax. No acute
fractures are seen.
IMPRESSION: 1. Medial right upper lobe airspace disease may represent infection.
Other etiologies are not excluded. Followup PA and lateral chest
X-ray is recommended in 3-4 weeks following trial of antibiotic
therapy to ensure resolution and exclude underlying malignancy.

## 2023-04-28 ENCOUNTER — Other Ambulatory Visit: Payer: Medicare (Managed Care) | Admitting: Licensed Clinical Social Worker

## 2023-04-28 NOTE — Patient Instructions (Signed)
 Visit Information  Thank you for taking time to visit with me today. Please don't hesitate to contact me if I can be of assistance to you before our next scheduled appointment.  Our next appointment is by telephone on 05/12/2022 at 11:00 am Please call the care guide team at [phone] if you need to cancel or reschedule your appointment.   Following is a copy of your care plan:   Goals Addressed             This Visit's Progress    Care Coordination Activities       Care Coordination Interventions: Patient stated that she has applied for Medicaid and keeps getting the run around and need some more assistance. Sw will make the referral to the walk in clinic.  No other SDOH needs Sw will follow up on 05/12/2023 at 11:00 am        Please call the Suicide and Crisis Lifeline: 988 go to [facility] [address] [place] [id number]) call 911 if you are experiencing a Mental Health or Behavioral Health Crisis or need someone to talk to.  Patient verbalizes understanding of instructions and care plan provided today and agrees to view in MyChart. Active MyChart status and patient understanding of how to access instructions and care plan via MyChart confirmed with patient.    Jonda Neighbours, PhD [facility], [facility] Social Worker Direct Dial: [phone]  Fax: [phone]

## 2023-04-28 NOTE — Patient Outreach (Signed)
 Complex Care Management   Visit Note  04/28/2023  Name:  [name] MRN: [id number] DOB: 01-23-1955  Situation: Referral received for Complex Care Management related to SDOH Barriers:  Medicaid  I obtained verbal consent from Caregiver.  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   Hyperammonemia (HCC) 09/30/2022   Hypertension    Kidney stone    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   Obesity    Pulmonary emboli (HCC) 01/10/2009   Pulmonary embolism (HCC)    Short gut syndrome    Thyroid  disease     Assessment: Patient Reported Symptoms:  Cognitive        Neurological      HEENT        Cardiovascular      Respiratory      Endocrine      Gastrointestinal        Genitourinary      Integumentary      Musculoskeletal          Psychosocial              03/06/2023   12:09 PM  Depression screen PHQ 2/9  Decreased Interest 1  Down, Depressed, Hopeless 0  PHQ - 2 Score 1  Altered sleeping 2  Tired, decreased energy 1  Change in appetite 0  Feeling bad or failure about yourself  1  Trouble concentrating 1  Moving slowly or fidgety/restless 1  Suicidal thoughts 0  PHQ-9 Score 7  Difficult doing work/chores Somewhat difficult    There were no vitals filed for this visit.  Medications Reviewed Today   Medications were not reviewed in this encounter      Recommendation:   Referral to: Walk in [facility]  Follow Up Plan:   Telephone follow up appointment date/time:  05/12/2023 at 11:00 am  Jonda Neighbours, PhD [facility], [facility] Social Worker Direct Dial: [phone]  Fax: [phone]

## 2023-05-01 ENCOUNTER — Other Ambulatory Visit: Payer: Self-pay | Admitting: Family Medicine

## 2023-05-01 MED ORDER — MAGNESIUM GLUCONATE 500 (27 MG) MG PO TABS
500.0000 mg | ORAL_TABLET | Freq: Two times a day (BID) | ORAL | 1 refills | Status: AC
  Filled 2023-05-01 – 2023-07-27 (×3): qty 30, 15d supply, fill #0

## 2023-05-02 ENCOUNTER — Other Ambulatory Visit (HOSPITAL_COMMUNITY): Payer: Self-pay

## 2023-05-02 ENCOUNTER — Other Ambulatory Visit: Payer: Self-pay

## 2023-05-02 ENCOUNTER — Encounter: Payer: Self-pay | Admitting: Obstetrics and Gynecology

## 2023-05-02 ENCOUNTER — Encounter: Payer: Self-pay | Admitting: Family Medicine

## 2023-05-02 ENCOUNTER — Encounter: Payer: Self-pay | Admitting: Pharmacist

## 2023-05-02 DIAGNOSIS — N950 Postmenopausal bleeding: Secondary | ICD-10-CM

## 2023-05-02 MED ORDER — ROPINIROLE HCL 0.25 MG PO TABS: 0.2500 mg | ORAL_TABLET | Freq: Every day | ORAL | 0 refills | Status: DC

## 2023-05-03 ENCOUNTER — Encounter (HOSPITAL_COMMUNITY): Payer: Self-pay

## 2023-05-03 ENCOUNTER — Emergency Department (HOSPITAL_COMMUNITY)
Admission: EM | Admit: 2023-05-03 | Discharge: 2023-05-04 | Disposition: A | Discharge status: Home / Self Care | Payer: Medicare (Managed Care) | Attending: Emergency Medicine | Admitting: Emergency Medicine

## 2023-05-03 ENCOUNTER — Emergency Department (HOSPITAL_COMMUNITY): Payer: Medicare (Managed Care)

## 2023-05-03 ENCOUNTER — Other Ambulatory Visit: Payer: Self-pay

## 2023-05-03 DIAGNOSIS — R410 Disorientation, unspecified: Secondary | ICD-10-CM | POA: Diagnosis not present

## 2023-05-03 DIAGNOSIS — R600 Localized edema: Secondary | ICD-10-CM | POA: Diagnosis not present

## 2023-05-03 DIAGNOSIS — R1011 Right upper quadrant pain: Secondary | ICD-10-CM | POA: Diagnosis not present

## 2023-05-03 DIAGNOSIS — E039 Hypothyroidism, unspecified: Secondary | ICD-10-CM | POA: Diagnosis not present

## 2023-05-03 DIAGNOSIS — R1031 Right lower quadrant pain: Secondary | ICD-10-CM | POA: Insufficient documentation

## 2023-05-03 DIAGNOSIS — Z79899 Other long term (current) drug therapy: Secondary | ICD-10-CM | POA: Insufficient documentation

## 2023-05-03 LAB — URINALYSIS, ROUTINE W REFLEX MICROSCOPIC
Bilirubin Urine: NEGATIVE
Glucose, UA: NEGATIVE mg/dL
Hgb urine dipstick: NEGATIVE
Ketones, ur: NEGATIVE mg/dL
Nitrite: NEGATIVE
Protein, ur: NEGATIVE mg/dL
Specific Gravity, Urine: 1.006 (ref 1.005–1.030)
pH: 5 (ref 5.0–8.0)

## 2023-05-03 LAB — CBC WITH DIFFERENTIAL/PLATELET
Abs Immature Granulocytes: 0.02 10*3/uL (ref 0.00–0.07)
Basophils Absolute: 0.1 10*3/uL (ref 0.0–0.1)
Basophils Relative: 1 %
Eosinophils Absolute: 0.5 10*3/uL (ref 0.0–0.5)
Eosinophils Relative: 7 %
HCT: 34.1 % — ABNORMAL LOW (ref 36.0–46.0)
Hemoglobin: 10.3 g/dL — ABNORMAL LOW (ref 12.0–15.0)
Immature Granulocytes: 0 %
Lymphocytes Relative: 32 %
Lymphs Abs: 2.1 10*3/uL (ref 0.7–4.0)
MCH: 29 pg (ref 26.0–34.0)
MCHC: 30.2 g/dL (ref 30.0–36.0)
MCV: 96.1 fL (ref 80.0–100.0)
Monocytes Absolute: 0.7 10*3/uL (ref 0.1–1.0)
Monocytes Relative: 11 %
Neutro Abs: 3 10*3/uL (ref 1.7–7.7)
Neutrophils Relative %: 49 %
Platelets: 183 10*3/uL (ref 150–400)
RBC: 3.55 MIL/uL — ABNORMAL LOW (ref 3.87–5.11)
RDW: 16.8 % — ABNORMAL HIGH (ref 11.5–15.5)
WBC: 6.3 10*3/uL (ref 4.0–10.5)
nRBC: 0 % (ref 0.0–0.2)

## 2023-05-03 LAB — COMPREHENSIVE METABOLIC PANEL WITH GFR
ALT: 23 U/L (ref 0–44)
AST: 32 U/L (ref 15–41)
Albumin: 3.1 g/dL — ABNORMAL LOW (ref 3.5–5.0)
Alkaline Phosphatase: 93 U/L (ref 38–126)
Anion gap: 9 (ref 5–15)
BUN: 22 mg/dL (ref 8–23)
CO2: 19 mmol/L — ABNORMAL LOW (ref 22–32)
Calcium: 8.8 mg/dL — ABNORMAL LOW (ref 8.9–10.3)
Chloride: 112 mmol/L — ABNORMAL HIGH (ref 98–111)
Creatinine, Ser: 1.11 mg/dL — ABNORMAL HIGH (ref 0.44–1.00)
GFR, Estimated: 55 mL/min — ABNORMAL LOW (ref >60)
Glucose, Bld: 98 mg/dL (ref 70–99)
Potassium: 4.1 mmol/L (ref 3.5–5.1)
Sodium: 140 mmol/L (ref 135–145)
Total Bilirubin: 2.1 mg/dL — ABNORMAL HIGH (ref 0.0–1.2)
Total Protein: 6.7 g/dL (ref 6.5–8.1)

## 2023-05-03 LAB — AMMONIA: Ammonia: 75 umol/L — ABNORMAL HIGH (ref 9–35)

## 2023-05-03 LAB — ACETAMINOPHEN LEVEL: Acetaminophen (Tylenol), Serum: 19 ug/mL (ref 10–30)

## 2023-05-03 LAB — MAGNESIUM: Magnesium: 1.9 mg/dL (ref 1.7–2.4)

## 2023-05-03 LAB — SALICYLATE LEVEL: Salicylate Lvl: <7 mg/dL — ABNORMAL LOW (ref 7.0–30.0)

## 2023-05-03 MED ORDER — MEGESTROL ACETATE 40 MG PO TABS: 40.0000 mg | ORAL_TABLET | Freq: Two times a day (BID) | ORAL | 5 refills | Status: DC

## 2023-05-03 MED ORDER — ROPINIROLE HCL 0.25 MG PO TABS
0.2500 mg | ORAL_TABLET | Freq: Once | ORAL | Status: AC
  Administered 2023-05-03: 0.25 mg via ORAL
  Filled 2023-05-03: qty 1

## 2023-05-03 NOTE — ED Provider Triage Note (Signed)
 Emergency Medicine Provider Triage Evaluation Note  [name] , a 68 y.o. female  was evaluated in triage.  Pt complains of confusion.  Son at bedside.  History of hepatic encephalopathy.  Compliant with lactulose .  No recent falls or head injury  Review of Systems  Positive: As above Negative: As above  Physical Exam  BP (!) 178/93 (BP Location: Right Arm)   Pulse 88   Temp 98.3 F (36.8 C) (Oral)   Resp 18   Ht 5\' 2"  (1.575 m)   Wt 103.4 kg   SpO2 100%   BMI 41.70 kg/m  Gen:   Awake, no distress   Resp:  Normal effort  MSK:   Moves extremities without difficulty  Other:    Medical Decision Making  Medically screening exam initiated at 3:17 PM.  Appropriate orders placed.  [name] was informed that the remainder of the evaluation will be completed by another provider, this initial triage assessment does not replace that evaluation, and the importance of remaining in the ED until their evaluation is complete.     Lucina Sabal, PA-C 05/03/23 [phone]

## 2023-05-03 NOTE — Discharge Instructions (Signed)
 Continue current medications.  It is okay to continue the small dose of Benadryl  that you have been using to help her get some sleep.  Continue the lactulose  and increase the dose if she is not having regular bowel movements like you been doing.  If she starts having fever, worsening confusion or you have other concerns return to the emergency room.  All the CAT scans today and blood work otherwise looks good.  Her ammonia level was a little bit elevated at 75.

## 2023-05-03 NOTE — ED Triage Notes (Signed)
 Pt lives at home with son, has had some dark colored urine. Pt son reports pt has been more awake than her normal self and he "wants to get her seen early before she is not doing good" Pt alert and orientedx4 on arrival

## 2023-05-03 NOTE — ED Provider Notes (Signed)
[place] [facility]MENT AT [facility] Provider Note   CSN: [id number] Arrival date & time: 05/03/23  1433     History  Chief Complaint  Patient presents with   Urinary Issue    [name] is a 68 y.o. female.  Pt is a 67y/o female with hx of liver cirrhosis secondary to [facility], hypothyroidism, hyperlipidemia, GERD, chronic anemia and thrombocytopenia who was recently admitted for cellulitis and discharged at the beginning of April who is presenting today with her son due to a 4-day history of some mild confusion, harder stools and not sleeping well.  Her son gives the history and reports she is just been a little bit slower mentally than what she normally is.  He is not sure if that is related to her ammonia levels or related to her not getting much sleep at night.  He reports she normally gets some Benadryl  at night to help her sleep but she has been having a lot of back pain from known compression fractures and he had been giving her half a tablet of hydrocodone  and did not want to mix the medications.  Because of that she has not been sleeping well.  However he also had noted her stools were getting a little bit hard so was giving her extra doses of her lactulose  which she did reports she had multiple stools today and they were starting to get much looser.  However when she woke up this afternoon he felt like she was even more confused and was not sure if she had just been in a deep sleep or if it was not related to her ammonia levels.  She is complaining of ongoing abdominal pain as well but he reports she has been eating and drinking normally.  There is been no fever cough or shortness of breath.  She continues to have swelling in her lower extremities but that is improving and she continues to take her diuretics.  Patient reports sometimes she just cannot seem to keep her thoughts together.  But denies any falls or head injury.  The history is provided by the patient  and a caregiver.       Home Medications Prior to Admission medications   Medication Sig Start Date End Date Taking? Authorizing Provider  albuterol  (VENTOLIN  HFA) 108 (90 Base) MCG/ACT inhaler Inhale 2 puffs into the lungs every 6 (six) hours as needed for shortness of breath or wheezing. 02/13/23   [provider]  atorvastatin  (LIPITOR) 40 MG tablet Take 1 tablet (40 mg total) by mouth in the morning. 02/22/23   Amadeo June, MD  copper  tablet Take 2 mg by mouth in the morning and at bedtime.    [provider]  cyanocobalamin  (VITAMIN B12) 1000 MCG tablet Take 2,000 mcg by mouth every Monday, Wednesday, and Friday.    [provider]  dicyclomine  (BENTYL ) 10 MG capsule Take 1 capsule (10 mg total) by mouth 3 (three) times daily as needed for spasms (abdominal cramping). 09/09/22   Faith Homes, MD  famotidine  (PEPCID ) 20 MG tablet Take 20 mg by mouth in the morning.    [provider]  folic acid  (FOLVITE ) 1 MG tablet Take 1 tablet (1 mg total) by mouth daily. 06/16/22   Rizwan, [name], MD  furosemide  (LASIX ) 20 MG tablet Take 3 tablets (60mg ) daily for 1 week,  then go back to home dose of 2 tablets (40 mg) daily. Take additional 2 tablets (40 mg) if there is a  weight gain of 3 lbs in1 day or 5 lbs in 1 week. 04/14/23   Lesa Rape, MD  furosemide  (LASIX ) 40 MG tablet Take 1 tablet (40 mg total) by mouth 2 (two) times daily. 04/24/23   Amadeo June, MD  HYDROcodone -acetaminophen  (NORCO) 10-325 MG tablet Take 1 tablet by mouth every 8 (eight) hours as needed for moderate pain (pain score 4-6). 12/19/22   [provider]  IRON -VITAMIN C  PO Take 1 tablet by mouth daily with breakfast.    [provider]  lactulose  (CHRONULAC ) 10 GM/15ML solution Take 30 mLs (20 g total) by mouth 3 (three) times daily. Take to ensure that there is 2 loose bowel movement EVERY DAY Patient taking differently: Take 30 mLs by mouth 3 (three) times daily as needed (to  ensure there are 2 loose bowel movements EVERY DAY). 10/31/22   Kraig Peru, MD  levothyroxine  (SYNTHROID ) 175 MCG tablet Take 1 tablet (175 mcg total) by mouth daily at 6 (six) AM. Recheck TSH with PCP in 4 weeks 04/03/23   [name], Jennelle Mocha, DO  lidocaine  (LIDODERM ) 5 % Place 1 patch onto the skin daily. Remove & Discard patch within 12 hours or as directed by MD Patient taking differently: Place 1 patch onto the skin daily as needed (for pain- Remove & Discard patch within 12 hours or as directed by MD). 10/31/22   Kraig Peru, MD  magnesium  gluconate (MAGONATE) 500 (27 Mg) MG TABS tablet Take 1 tablet (500 mg total) by mouth in the morning and at bedtime. 05/01/23   Amadeo June, MD  megestrol  (MEGACE ) 40 MG tablet Take 1 tablet (40 mg total) by mouth 2 (two) times daily. 05/03/23   Ajewole, [name], MD  methocarbamol  (ROBAXIN ) 500 MG tablet Take 500 mg by mouth in the morning. 03/04/22   [provider]  midodrine  (PROAMATINE ) 10 MG tablet Take 1 tablet (10 mg total) by mouth 3 (three) times daily with meals. 09/09/22   Faith Homes, MD  Multiple Vitamins-Minerals (CENTRUM SILVER ULTRA WOMENS PO) Take 1 tablet by mouth in the morning.    [provider]  naloxone [facility]) nasal spray 4 mg/0.1 mL Place 1 spray into the nose as needed (accidental overdose). 03/25/22   [provider]  nystatin  cream (MYCOSTATIN ) Apply topically 2 (two) times daily. Patient taking differently: Apply 1 Application topically 2 (two) times daily as needed for dry skin. 01/13/23   Lonita Roach, MD  pantoprazole  (PROTONIX ) 40 MG tablet Take 1 tablet (40 mg total) by mouth 2 (two) times daily before a meal. 09/09/22   Faith Homes, MD  potassium chloride  (KLOR-CON ) 10 MEQ tablet Take 20 mEq by mouth 2 (two) times daily. 02/22/23   [provider]  promethazine  (PHENERGAN ) 12.5 MG tablet Take 12.5 mg by mouth every 6 (six) hours as needed for nausea or vomiting.    [provider]  rifaximin  (XIFAXAN ) 550 MG TABS tablet Take 550 mg by mouth 2 (two) times daily.    [provider]  rOPINIRole  (REQUIP ) 0.25 MG tablet Take 1 tablet (0.25 mg total) by mouth at bedtime. 05/02/23   Amadeo June, MD  spironolactone  (ALDACTONE ) 50 MG tablet Take 1 tablet (50 mg total) by mouth daily. 03/20/23   Amadeo June, MD  tamsulosin  (FLOMAX ) 0.4 MG CAPS capsule Take 1 capsule (0.4 mg total) by mouth daily. 09/10/22   Faith Homes, MD  triamcinolone  cream (KENALOG ) 0.5 % Apply to affected area of right breast twice daily as  directed. 05/10/22   Krishnan, Gokul, MD  venlafaxine  XR (EFFEXOR -XR) 75 MG 24 hr capsule Take 1 capsule (75 mg total) by mouth daily with breakfast. 04/21/23   Amadeo June, MD  Vitamin D, Ergocalciferol, (DRISDOL) 50000 UNITS CAPS Take 50,000 Units by mouth every Wednesday.    [provider]      Allergies    Ace inhibitors, Ivp dye [iodinated contrast media], Desitin [zinc  oxide], Gadolinium derivatives, Hydroxyzine , Chlorhexidine , Doxycycline, Penicillins, and Zofran  [ondansetron ]    Review of Systems   Review of Systems  Physical Exam Updated Vital Signs BP (!) 114/47 (BP Location: Left Arm)   Pulse 72   Temp 98.6 F (37 C) (Oral)   Resp 17   Ht 5\' 2"  (1.575 m)   Wt 103.4 kg   SpO2 100%   BMI 41.70 kg/m  Physical Exam Vitals and nursing note reviewed.  Constitutional:      General: She is not in acute distress.    Appearance: She is well-developed.  HENT:     Head: Normocephalic and atraumatic.  Eyes:     General: Scleral icterus present.     Pupils: Pupils are equal, round, and reactive to light.  Cardiovascular:     Rate and Rhythm: Normal rate and regular rhythm.     Heart sounds: Normal heart sounds. No murmur heard.    No friction rub.  Pulmonary:     Effort: Pulmonary effort is normal.     Breath sounds: Normal breath sounds. No wheezing or rales.  Abdominal:     General: Bowel sounds are normal.  There is no distension.     Palpations: Abdomen is soft.     Tenderness: There is abdominal tenderness in the right upper quadrant and right lower quadrant. There is no guarding or rebound.  Musculoskeletal:        General: No tenderness. Normal range of motion.     Right lower leg: Edema present.     Left lower leg: Edema present.     Comments: 2+ pitting edema in bilateral ankles and mid tib-fib  Skin:    General: Skin is warm and dry.     Findings: No rash.  Neurological:     Mental Status: She is alert and oriented to person, place, and time. Mental status is at baseline.     Cranial Nerves: No cranial nerve deficit.  Psychiatric:        Mood and Affect: Mood normal.        Behavior: Behavior normal.     ED Results / Procedures / Treatments   Labs (all labs ordered are listed, but only abnormal results are displayed) Labs Reviewed  URINALYSIS, ROUTINE W REFLEX MICROSCOPIC - Abnormal; Notable for the following components:      Result Value   Color, Urine STRAW (*)    Leukocytes,Ua SMALL (*)    Bacteria, UA RARE (*)    All other components within normal limits  CBC WITH DIFFERENTIAL/PLATELET - Abnormal; Notable for the following components:   RBC 3.55 (*)    Hemoglobin 10.3 (*)    HCT 34.1 (*)    RDW 16.8 (*)    All other components within normal limits  COMPREHENSIVE METABOLIC PANEL WITH GFR - Abnormal; Notable for the following components:   Chloride 112 (*)    CO2 19 (*)    Creatinine, Ser 1.11 (*)    Calcium  8.8 (*)    Albumin  3.1 (*)    Total Bilirubin 2.1 (*)  GFR, Estimated 55 (*)    All other components within normal limits  SALICYLATE LEVEL - Abnormal; Notable for the following components:   Salicylate Lvl <7.0 (*)    All other components within normal limits  AMMONIA - Abnormal; Notable for the following components:   Ammonia 75 (*)    All other components within normal limits  ACETAMINOPHEN  LEVEL  MAGNESIUM     EKG None  Radiology CT ABDOMEN  PELVIS WO CONTRAST Result Date: 05/03/2023 CLINICAL DATA:  Abdominal pain, acute, nonlocalized. has had some dark colored urine. Pt son reports pt has been more awake than her normal self and he "wants to get her seen early before she is not doing good" P EXAM: CT ABDOMEN AND PELVIS WITHOUT CONTRAST TECHNIQUE: Multidetector CT imaging of the abdomen and pelvis was performed following the standard protocol without IV contrast. RADIATION DOSE REDUCTION: This exam was performed according to the departmental dose-optimization program which includes automated exposure control, adjustment of the mA and/or kV according to patient size and/or use of iterative reconstruction technique. COMPARISON:  None Available. FINDINGS: Lower chest: Coronary artery calcification. Mitral annular calcification. Hepatobiliary: Nodular hepatic contour. No focal liver abnormality. Status post cholecystectomy. No biliary dilatation. Pancreas: No focal lesion. Normal pancreatic contour. No surrounding inflammatory changes. No main pancreatic ductal dilatation. Spleen: Enlarged on axial measurements measuring up to 13.5 cm. No focal abnormality. Adrenals/Urinary Tract: No adrenal nodule bilaterally. 2 mm right nephrolithiasis. No left nephrolithiasis. No ureterolithiasis. No hydroureteronephrosis. The urinary bladder is unremarkable. Stomach/Bowel: Stomach is within normal limits. No evidence of bowel wall thickening or dilatation. Status post appendectomy. Likely partial right colectomy. Vascular/Lymphatic: No abdominal aorta or iliac aneurysm. Severe atherosclerotic plaque of the aorta and its branches. No abdominal, pelvic, or inguinal lymphadenopathy. Reproductive: Calcified uterine fibroids. Otherwise uterus and bilateral adnexa are unremarkable. Other: No intraperitoneal free fluid. No intraperitoneal free gas. No organized fluid collection. Musculoskeletal: Small fat containing umbilical hernia.  Diastasis rectus. No suspicious lytic or  blastic osseous lesions. No acute displaced fracture. Multilevel degenerative changes of the spine. T12 compression fracture status post kyphoplasty. Chronic appearing T11 compression fracture. IMPRESSION: 1. No acute intra-abdominal and intrapelvic abnormality with limited evaluation on this noncontrast study. 2. Cirrhosis with portal hypertension. No focal liver lesions identified. Please note that liver protocol enhanced MR and CT are the most sensitive tests for the screening detection of hepatocellular carcinoma in the high risk setting of cirrhosis. 3. Nonobstructive 2 mm right nephrolithiasis. 4. Uterine fibroids. 5. Aortic Atherosclerosis (ICD10-I70.0) including mitral annular and coronary artery calcification. Electronically Signed   By: Morgane  Naveau M.D.   On: 05/03/2023 22:43   CT Head Wo Contrast Result Date: 05/03/2023 CLINICAL DATA:  Altered mental status EXAM: CT HEAD WITHOUT CONTRAST TECHNIQUE: Contiguous axial images were obtained from the base of the skull through the vertex without intravenous contrast. RADIATION DOSE REDUCTION: This exam was performed according to the departmental dose-optimization program which includes automated exposure control, adjustment of the mA and/or kV according to patient size and/or use of iterative reconstruction technique. COMPARISON:  03/05/2023 FINDINGS: Brain: There is no mass, hemorrhage or extra-axial collection. The size and configuration of the ventricles and extra-axial CSF spaces are normal. There is hypoattenuation of the white matter, most commonly indicating chronic small vessel disease. Vascular: Atherosclerotic calcification of the vertebral and internal carotid arteries at the skull base. No abnormal hyperdensity of the major intracranial arteries or dural venous sinuses. Skull: The visualized skull base, calvarium and extracranial soft tissues are  normal. Sinuses/Orbits: No fluid levels or advanced mucosal thickening of the visualized paranasal  sinuses. No mastoid or middle ear effusion. Normal orbits. Other: None. IMPRESSION: 1. No acute intracranial abnormality. 2. Chronic small vessel disease. Electronically Signed   By: Juanetta Nordmann M.D.   On: 05/03/2023 22:11   DG Chest Portable 1 View Result Date: 05/03/2023 CLINICAL DATA:  Chest pain EXAM: PORTABLE CHEST 1 VIEW COMPARISON:  April 05, 2023 FINDINGS: The heart size and mediastinal contours are within normal limits. Both lungs are clear. The visualized skeletal structures are unremarkable. IMPRESSION: No active disease. Electronically Signed   By: Fredrich Jefferson M.D.   On: 05/03/2023 15:59    Procedures Procedures    Medications Ordered in ED Medications  rOPINIRole  (REQUIP ) tablet 0.25 mg (has no administration in time range)    ED Course/ Medical Decision Making/ A&P                                 Medical Decision Making Amount and/or Complexity of Data Reviewed Independent Historian: caregiver External Data Reviewed: notes. Labs: ordered. Decision-making details documented in ED Course. Radiology: ordered and independent interpretation performed. Decision-making details documented in ED Course.  Risk Prescription drug management.   Pt with multiple medical problems and comorbidities and presenting today with a complaint that caries a high risk for morbidity and mortality.  Here today with some mild confusion, change in stools and not sleeping well.  Patient has no infectious complaints but is complaining of some abdominal pain.  She has had prior cholecystectomy.  Her son has been giving her acting lactulose  which has improved her stools.  Patient seems to be mentating normally here.  Will ensure no evidence of hepatic encephalopathy new acute brain pathology, infection or new renal disease.  I independently interpreted patient's labs and CBC with stable hemoglobin of 10 and no acute findings, CMP with stable creatinine of 1.11 normal sodium and potassium and normal  LFTs, total bilirubin is 2.1 which is not significantly different from baseline.  Patient's acetaminophen  level is 19 and magnesium  is normal.  UA without evidence of infection today,  I have independently visualized and interpreted pt's images today. CXR wnl.  Will do imaging of the abdomen and head to ensure no other acute pathology.  Ammonia levels are pending.  11:09 PM I have independently visualized and interpreted pt's images today.  CT of the head today negative for acute bleed and radiology reports no acute process but there is some evidence of chronic small vessel disease.  CT of the abdomen radiology reports no acute intra-abdominal or intrapelvic abnormalities.  Evidence of cirrhosis with portal hypertension, nonobstructive renal stones and uterine fibroids.  Ammonia was mildly elevated at 75.  However on repeat evaluation patient is still awake and alert.  At this time feel that she would be stable for discharge home.  Discussed this with her son.  He is going to continue to use the lactulose  and based doses on her mental status.  They are both comfortable with this plan and he will return if her symptoms worsen.  At this time low suspicion for stroke or infectious etiology.          Final Clinical Impression(s) / ED Diagnoses Final diagnoses:  Transient confusion    Rx / DC Orders ED Discharge Orders     None         Almond Army, MD 05/03/23  2309  

## 2023-05-04 ENCOUNTER — Other Ambulatory Visit: Payer: Self-pay

## 2023-05-04 DIAGNOSIS — N950 Postmenopausal bleeding: Secondary | ICD-10-CM

## 2023-05-04 MED ORDER — MEGESTROL ACETATE 40 MG PO TABS: 40.0000 mg | ORAL_TABLET | Freq: Two times a day (BID) | ORAL | 0 refills | Status: AC

## 2023-05-22 ENCOUNTER — Other Ambulatory Visit: Payer: Self-pay

## 2023-05-22 ENCOUNTER — Observation Stay (HOSPITAL_COMMUNITY)
Admission: EM | Admit: 2023-05-22 | Discharge: 2023-05-29 | Disposition: A | Discharge status: Home / Self Care | Payer: Medicare (Managed Care) | Attending: Internal Medicine | Admitting: Internal Medicine

## 2023-05-22 ENCOUNTER — Emergency Department (HOSPITAL_COMMUNITY): Payer: Medicare (Managed Care)

## 2023-05-22 DIAGNOSIS — K439 Ventral hernia without obstruction or gangrene: Secondary | ICD-10-CM | POA: Diagnosis present

## 2023-05-22 DIAGNOSIS — G2581 Restless legs syndrome: Secondary | ICD-10-CM | POA: Diagnosis present

## 2023-05-22 DIAGNOSIS — K7682 Hepatic encephalopathy: Principal | ICD-10-CM | POA: Diagnosis present

## 2023-05-22 DIAGNOSIS — G8929 Other chronic pain: Secondary | ICD-10-CM | POA: Diagnosis not present

## 2023-05-22 DIAGNOSIS — F411 Generalized anxiety disorder: Secondary | ICD-10-CM | POA: Diagnosis not present

## 2023-05-22 DIAGNOSIS — M542 Cervicalgia: Secondary | ICD-10-CM | POA: Diagnosis not present

## 2023-05-22 DIAGNOSIS — R4182 Altered mental status, unspecified: Secondary | ICD-10-CM | POA: Diagnosis present

## 2023-05-22 DIAGNOSIS — D689 Coagulation defect, unspecified: Secondary | ICD-10-CM | POA: Insufficient documentation

## 2023-05-22 DIAGNOSIS — Z6841 Body Mass Index (BMI) 40.0 and over, adult: Secondary | ICD-10-CM | POA: Insufficient documentation

## 2023-05-22 DIAGNOSIS — K219 Gastro-esophageal reflux disease without esophagitis: Secondary | ICD-10-CM | POA: Diagnosis not present

## 2023-05-22 DIAGNOSIS — E039 Hypothyroidism, unspecified: Secondary | ICD-10-CM | POA: Diagnosis present

## 2023-05-22 DIAGNOSIS — E785 Hyperlipidemia, unspecified: Secondary | ICD-10-CM | POA: Insufficient documentation

## 2023-05-22 DIAGNOSIS — Z87891 Personal history of nicotine dependence: Secondary | ICD-10-CM | POA: Insufficient documentation

## 2023-05-22 DIAGNOSIS — E7220 Disorder of urea cycle metabolism, unspecified: Secondary | ICD-10-CM | POA: Insufficient documentation

## 2023-05-22 DIAGNOSIS — D693 Immune thrombocytopenic purpura: Secondary | ICD-10-CM | POA: Insufficient documentation

## 2023-05-22 DIAGNOSIS — K90829 Short bowel syndrome, unspecified: Secondary | ICD-10-CM | POA: Diagnosis present

## 2023-05-22 DIAGNOSIS — I9589 Other hypotension: Secondary | ICD-10-CM | POA: Diagnosis not present

## 2023-05-22 DIAGNOSIS — Z86718 Personal history of other venous thrombosis and embolism: Secondary | ICD-10-CM | POA: Insufficient documentation

## 2023-05-22 DIAGNOSIS — Z932 Ileostomy status: Secondary | ICD-10-CM | POA: Diagnosis not present

## 2023-05-22 DIAGNOSIS — E610 Copper deficiency: Secondary | ICD-10-CM | POA: Diagnosis present

## 2023-05-22 DIAGNOSIS — M549 Dorsalgia, unspecified: Secondary | ICD-10-CM | POA: Diagnosis not present

## 2023-05-22 DIAGNOSIS — N182 Chronic kidney disease, stage 2 (mild): Secondary | ICD-10-CM | POA: Diagnosis not present

## 2023-05-22 DIAGNOSIS — E66813 Obesity, class 3: Secondary | ICD-10-CM | POA: Insufficient documentation

## 2023-05-22 DIAGNOSIS — Z79899 Other long term (current) drug therapy: Secondary | ICD-10-CM | POA: Insufficient documentation

## 2023-05-22 DIAGNOSIS — N179 Acute kidney failure, unspecified: Secondary | ICD-10-CM

## 2023-05-22 DIAGNOSIS — R55 Syncope and collapse: Secondary | ICD-10-CM | POA: Diagnosis present

## 2023-05-22 DIAGNOSIS — K7581 Nonalcoholic steatohepatitis (NASH): Secondary | ICD-10-CM | POA: Diagnosis not present

## 2023-05-22 DIAGNOSIS — K7290 Hepatic failure, unspecified without coma: Secondary | ICD-10-CM | POA: Diagnosis not present

## 2023-05-22 DIAGNOSIS — I129 Hypertensive chronic kidney disease with stage 1 through stage 4 chronic kidney disease, or unspecified chronic kidney disease: Secondary | ICD-10-CM | POA: Insufficient documentation

## 2023-05-22 DIAGNOSIS — G9341 Metabolic encephalopathy: Secondary | ICD-10-CM | POA: Diagnosis not present

## 2023-05-22 DIAGNOSIS — Z86711 Personal history of pulmonary embolism: Secondary | ICD-10-CM | POA: Diagnosis not present

## 2023-05-22 DIAGNOSIS — R600 Localized edema: Secondary | ICD-10-CM

## 2023-05-22 DIAGNOSIS — E872 Acidosis, unspecified: Secondary | ICD-10-CM | POA: Insufficient documentation

## 2023-05-22 DIAGNOSIS — M79606 Pain in leg, unspecified: Secondary | ICD-10-CM | POA: Insufficient documentation

## 2023-05-22 DIAGNOSIS — K7460 Unspecified cirrhosis of liver: Secondary | ICD-10-CM | POA: Diagnosis not present

## 2023-05-22 DIAGNOSIS — H1702 Adherent leukoma, left eye: Secondary | ICD-10-CM | POA: Insufficient documentation

## 2023-05-22 DIAGNOSIS — D649 Anemia, unspecified: Secondary | ICD-10-CM | POA: Insufficient documentation

## 2023-05-22 DIAGNOSIS — D631 Anemia in chronic kidney disease: Secondary | ICD-10-CM | POA: Insufficient documentation

## 2023-05-22 DIAGNOSIS — R109 Unspecified abdominal pain: Secondary | ICD-10-CM | POA: Diagnosis not present

## 2023-05-22 DIAGNOSIS — I959 Hypotension, unspecified: Secondary | ICD-10-CM | POA: Diagnosis present

## 2023-05-22 DIAGNOSIS — H1789 Other corneal scars and opacities: Secondary | ICD-10-CM | POA: Diagnosis present

## 2023-05-22 DIAGNOSIS — D696 Thrombocytopenia, unspecified: Secondary | ICD-10-CM | POA: Diagnosis not present

## 2023-05-22 LAB — CBC WITH DIFFERENTIAL/PLATELET
Abs Immature Granulocytes: 0.02 10*3/uL (ref 0.00–0.07)
Basophils Absolute: 0.1 10*3/uL (ref 0.0–0.1)
Basophils Relative: 1 %
Eosinophils Absolute: 0.5 10*3/uL (ref 0.0–0.5)
Eosinophils Relative: 6 %
HCT: 32.9 % — ABNORMAL LOW (ref 36.0–46.0)
Hemoglobin: 10.1 g/dL — ABNORMAL LOW (ref 12.0–15.0)
Immature Granulocytes: 0 %
Lymphocytes Relative: 26 %
Lymphs Abs: 1.8 10*3/uL (ref 0.7–4.0)
MCH: 29.3 pg (ref 26.0–34.0)
MCHC: 30.7 g/dL (ref 30.0–36.0)
MCV: 95.4 fL (ref 80.0–100.0)
Monocytes Absolute: 0.6 10*3/uL (ref 0.1–1.0)
Monocytes Relative: 8 %
Neutro Abs: 4.2 10*3/uL (ref 1.7–7.7)
Neutrophils Relative %: 59 %
Platelets: 162 10*3/uL (ref 150–400)
RBC: 3.45 MIL/uL — ABNORMAL LOW (ref 3.87–5.11)
RDW: 17.5 % — ABNORMAL HIGH (ref 11.5–15.5)
WBC: 7.2 10*3/uL (ref 4.0–10.5)
nRBC: 0 % (ref 0.0–0.2)

## 2023-05-22 LAB — COMPREHENSIVE METABOLIC PANEL WITH GFR
ALT: 26 U/L (ref 0–44)
AST: 37 U/L (ref 15–41)
Albumin: 2.9 g/dL — ABNORMAL LOW (ref 3.5–5.0)
Alkaline Phosphatase: 90 U/L (ref 38–126)
Anion gap: 7 (ref 5–15)
BUN: 28 mg/dL — ABNORMAL HIGH (ref 8–23)
CO2: 21 mmol/L — ABNORMAL LOW (ref 22–32)
Calcium: 8.5 mg/dL — ABNORMAL LOW (ref 8.9–10.3)
Chloride: 113 mmol/L — ABNORMAL HIGH (ref 98–111)
Creatinine, Ser: 1.3 mg/dL — ABNORMAL HIGH (ref 0.44–1.00)
GFR, Estimated: 46 mL/min — ABNORMAL LOW (ref >60)
Glucose, Bld: 117 mg/dL — ABNORMAL HIGH (ref 70–99)
Potassium: 4.1 mmol/L (ref 3.5–5.1)
Sodium: 141 mmol/L (ref 135–145)
Total Bilirubin: 2.2 mg/dL — ABNORMAL HIGH (ref 0.0–1.2)
Total Protein: 6.2 g/dL — ABNORMAL LOW (ref 6.5–8.1)

## 2023-05-22 LAB — ACETAMINOPHEN LEVEL: Acetaminophen (Tylenol), Serum: <10 ug/mL — ABNORMAL LOW (ref 10–30)

## 2023-05-22 LAB — LIPASE, BLOOD: Lipase: 58 U/L — ABNORMAL HIGH (ref 11–51)

## 2023-05-22 LAB — AMMONIA: Ammonia: 129 umol/L — ABNORMAL HIGH (ref 9–35)

## 2023-05-22 LAB — MAGNESIUM: Magnesium: 2.2 mg/dL (ref 1.7–2.4)

## 2023-05-22 LAB — SALICYLATE LEVEL: Salicylate Lvl: <7 mg/dL — ABNORMAL LOW (ref 7.0–30.0)

## 2023-05-22 MED ORDER — LACTULOSE 10 GM/15ML PO SOLN
10.0000 g | Freq: Once | ORAL | Status: AC
  Administered 2023-05-23: 10 g via ORAL
  Filled 2023-05-22: qty 30

## 2023-05-22 NOTE — ED Provider Notes (Signed)
 [name] EMERGENCY DEPARTMENT AT [facility] Provider Note   CSN: [id number] Arrival date & time: 05/22/23  1837     History {Add pertinent medical, surgical, social history, OB history to HPI:1} Chief Complaint  Patient presents with   Abdominal Pain   Near Syncope    [name] is a 68 y.o. female.  The history is provided by the patient and a relative.  Abdominal Pain Near Syncope Associated symptoms include abdominal pain.  [name] is a 68 y.o. female who presents to the Emergency Department complaining of *** Confused and out of it since 9am. Just came back from [place] .  Gave extra lactulose .  Falling asleep and confused. Forgetting words and events. No falls.  No fever. Has nausea, no vomiting. Bilateral leg pain. Multiple BMs (four times since ED arrival). No missed meds. No dysuria     Home Medications Prior to Admission medications   Medication Sig Start Date End Date Taking? Authorizing Provider  albuterol  (VENTOLIN  HFA) 108 (90 Base) MCG/ACT inhaler Inhale 2 puffs into the lungs every 6 (six) hours as needed for shortness of breath or wheezing. 02/13/23   [provider]  atorvastatin  (LIPITOR) 40 MG tablet Take 1 tablet (40 mg total) by mouth in the morning. 02/22/23   Amadeo June, MD  copper  tablet Take 2 mg by mouth in the morning and at bedtime.    [provider]  cyanocobalamin  (VITAMIN B12) 1000 MCG tablet Take 2,000 mcg by mouth every Monday, Wednesday, and Friday.    [provider]  dicyclomine  (BENTYL ) 10 MG capsule Take 1 capsule (10 mg total) by mouth 3 (three) times daily as needed for spasms (abdominal cramping). 09/09/22   Faith Homes, MD  famotidine  (PEPCID ) 20 MG tablet Take 20 mg by mouth in the morning.    [provider]  folic acid  (FOLVITE ) 1 MG tablet Take 1 tablet (1 mg total) by mouth daily. 06/16/22   Rizwan, [name], MD  furosemide  (LASIX ) 20 MG tablet Take 3 tablets (60mg )  daily for 1 week,  then go back to home dose of 2 tablets (40 mg) daily. Take additional 2 tablets (40 mg) if there is a weight gain of 3 lbs in1 day or 5 lbs in 1 week. 04/14/23   Lesa Rape, MD  furosemide  (LASIX ) 40 MG tablet Take 1 tablet (40 mg total) by mouth 2 (two) times daily. 04/24/23   Amadeo June, MD  HYDROcodone -acetaminophen  (NORCO) 10-325 MG tablet Take 1 tablet by mouth every 8 (eight) hours as needed for moderate pain (pain score 4-6). 12/19/22   [provider]  IRON -VITAMIN C  PO Take 1 tablet by mouth daily with breakfast.    [provider]  lactulose  (CHRONULAC ) 10 GM/15ML solution Take 30 mLs (20 g total) by mouth 3 (three) times daily. Take to ensure that there is 2 loose bowel movement EVERY DAY Patient taking differently: Take 30 mLs by mouth 3 (three) times daily as needed (to ensure there are 2 loose bowel movements EVERY DAY). 10/31/22   Kraig Peru, MD  levothyroxine  (SYNTHROID ) 175 MCG tablet Take 1 tablet (175 mcg total) by mouth daily at 6 (six) AM. Recheck TSH with PCP in 4 weeks 04/03/23   [name], Jennelle Mocha, DO  lidocaine  (LIDODERM ) 5 % Place 1 patch onto the skin daily. Remove & Discard patch within 12 hours or as directed by MD Patient taking differently: Place 1 patch onto the skin daily as needed (for pain- Remove &  Discard patch within 12 hours or as directed by MD). 10/31/22   Kraig Peru, MD  magnesium  gluconate (MAGONATE) 500 (27 Mg) MG TABS tablet Take 1 tablet (500 mg total) by mouth in the morning and at bedtime. 05/01/23   Amadeo June, MD  megestrol  (MEGACE ) 40 MG tablet Take 1 tablet (40 mg total) by mouth 2 (two) times daily. 05/04/23   Ajewole, [name], MD  methocarbamol  (ROBAXIN ) 500 MG tablet Take 500 mg by mouth in the morning. 03/04/22   [provider]  midodrine  (PROAMATINE ) 10 MG tablet Take 1 tablet (10 mg total) by mouth 3 (three) times daily with meals. 09/09/22   Faith Homes, MD  Multiple Vitamins-Minerals  (CENTRUM SILVER ULTRA WOMENS PO) Take 1 tablet by mouth in the morning.    [provider]  naloxone [facility]) nasal spray 4 mg/0.1 mL Place 1 spray into the nose as needed (accidental overdose). 03/25/22   [provider]  nystatin  cream (MYCOSTATIN ) Apply topically 2 (two) times daily. Patient taking differently: Apply 1 Application topically 2 (two) times daily as needed for dry skin. 01/13/23   Lonita Roach, MD  pantoprazole  (PROTONIX ) 40 MG tablet Take 1 tablet (40 mg total) by mouth 2 (two) times daily before a meal. 09/09/22   Faith Homes, MD  potassium chloride  (KLOR-CON ) 10 MEQ tablet Take 20 mEq by mouth 2 (two) times daily. 02/22/23   [provider]  promethazine  (PHENERGAN ) 12.5 MG tablet Take 12.5 mg by mouth every 6 (six) hours as needed for nausea or vomiting.    [provider]  rifaximin  (XIFAXAN ) 550 MG TABS tablet Take 550 mg by mouth 2 (two) times daily.    [provider]  rOPINIRole  (REQUIP ) 0.25 MG tablet Take 1 tablet (0.25 mg total) by mouth at bedtime. 05/02/23   Amadeo June, MD  spironolactone  (ALDACTONE ) 50 MG tablet Take 1 tablet (50 mg total) by mouth daily. 03/20/23   Amadeo June, MD  tamsulosin  (FLOMAX ) 0.4 MG CAPS capsule Take 1 capsule (0.4 mg total) by mouth daily. 09/10/22   Faith Homes, MD  triamcinolone  cream (KENALOG ) 0.5 % Apply to affected area of right breast twice daily as directed. 05/10/22   Krishnan, Gokul, MD  venlafaxine  XR (EFFEXOR -XR) 75 MG 24 hr capsule Take 1 capsule (75 mg total) by mouth daily with breakfast. 04/21/23   Amadeo June, MD  Vitamin D, Ergocalciferol, (DRISDOL) 50000 UNITS CAPS Take 50,000 Units by mouth every Wednesday.    [provider]      Allergies    Ace inhibitors, Ivp dye [iodinated contrast media], Desitin [zinc  oxide], Gadolinium derivatives, Hydroxyzine , Chlorhexidine , Doxycycline, Penicillins, and Zofran  [ondansetron ]    Review of Systems   Review of  Systems  Cardiovascular:  Positive for near-syncope.  Gastrointestinal:  Positive for abdominal pain.  All other systems reviewed and are negative.   Physical Exam Updated Vital Signs BP 137/74   Pulse 77   Temp 98.6 F (37 C) (Oral)   Resp 14   SpO2 99%  Physical Exam Vitals and nursing note reviewed.  Constitutional:      Appearance: She is well-developed.  HENT:     Head: Normocephalic and atraumatic.  Cardiovascular:     Rate and Rhythm: Normal rate and regular rhythm.     Heart sounds: Murmur heard.  Pulmonary:     Effort: Pulmonary effort is normal. No respiratory distress.     Breath sounds: Normal breath sounds.  Abdominal:  Palpations: Abdomen is soft.     Tenderness: There is no abdominal tenderness. There is no guarding or rebound.  Musculoskeletal:        General: No tenderness.     Comments: Pitting edema to BLE  Skin:    General: Skin is warm and dry.  Neurological:     Mental Status: She is alert.     Comments: Disoriented to place and time. Blind in left eye. 5/5 strength in BUE, 4/5 strength in BLE (pain in bilateral knees limits strength testing in legs).  Sensation to light touch intact in all four extremities.    Psychiatric:        Behavior: Behavior normal.     ED Results / Procedures / Treatments   Labs (all labs ordered are listed, but only abnormal results are displayed) Labs Reviewed  CBC WITH DIFFERENTIAL/PLATELET - Abnormal; Notable for the following components:      Result Value   RBC 3.45 (*)    Hemoglobin 10.1 (*)    HCT 32.9 (*)    RDW 17.5 (*)    All other components within normal limits  COMPREHENSIVE METABOLIC PANEL WITH GFR - Abnormal; Notable for the following components:   Chloride 113 (*)    CO2 21 (*)    Glucose, Bld 117 (*)    BUN 28 (*)    Creatinine, Ser 1.30 (*)    Calcium  8.5 (*)    Total Protein 6.2 (*)    Albumin  2.9 (*)    Total Bilirubin 2.2 (*)    GFR, Estimated 46 (*)    All other components within  normal limits  AMMONIA - Abnormal; Notable for the following components:   Ammonia 129 (*)    All other components within normal limits  SALICYLATE LEVEL - Abnormal; Notable for the following components:   Salicylate Lvl <7.0 (*)    All other components within normal limits  ACETAMINOPHEN  LEVEL - Abnormal; Notable for the following components:   Acetaminophen  (Tylenol ), Serum <10 (*)    All other components within normal limits  LIPASE, BLOOD - Abnormal; Notable for the following components:   Lipase 58 (*)    All other components within normal limits  MAGNESIUM   URINALYSIS, W/ REFLEX TO CULTURE (INFECTION SUSPECTED)    EKG None  Radiology CT HEAD WO CONTRAST ( ) Result Date: 05/22/2023 CLINICAL DATA:  Altered mental status EXAM: CT HEAD WITHOUT CONTRAST TECHNIQUE: Contiguous axial images were obtained from the base of the skull through the vertex without intravenous contrast. RADIATION DOSE REDUCTION: This exam was performed according to the departmental dose-optimization program which includes automated exposure control, adjustment of the mA and/or kV according to patient size and/or use of iterative reconstruction technique. COMPARISON:  05/03/2023 FINDINGS: Brain: No evidence of acute infarction, hemorrhage, hydrocephalus, extra-axial collection or mass lesion/mass effect. Chronic white matter ischemic changes are noted stable from the prior exam. Vascular: No hyperdense vessel or unexpected calcification. Skull: Normal. Negative for fracture or focal lesion. Sinuses/Orbits: No acute finding. Other: None. IMPRESSION: Chronic white matter ischemic changes.  No acute abnormality noted. Electronically Signed   By: Violeta Grey M.D.   On: 05/22/2023 20:43   DG Chest 2 View Result Date: 05/22/2023 CLINICAL DATA:  Altered mental status with nausea EXAM: CHEST - 2 VIEW COMPARISON:  05/03/2023 FINDINGS: Cardiac shadow is mildly prominent but accentuated by the frontal technique. Aortic  calcifications are seen. The lungs are clear bilaterally. No effusion is seen. Changes of prior vertebral augmentation in  the lower thoracic spine are noted. IMPRESSION: No active cardiopulmonary disease. Electronically Signed   By: Violeta Grey M.D.   On: 05/22/2023 20:23    Procedures Procedures  {Document cardiac monitor, telemetry assessment procedure when appropriate:1}  Medications Ordered in ED Medications - No data to display  ED Course/ Medical Decision Making/ A&P   {   Click here for ABCD2, HEART and other calculatorsREFRESH Note before signing :1}                              Medical Decision Making  ***  {Document critical care time when appropriate:1} {Document review of labs and clinical decision tools ie heart score, Chads2Vasc2 etc:1}  {Document your independent review of radiology images, and any outside records:1} {Document your discussion with family members, caretakers, and with consultants:1} {Document social determinants of health affecting pt's care:1} {Document your decision making why or why not admission, treatments were needed:1} Final Clinical Impression(s) / ED Diagnoses Final diagnoses:  None    Rx / DC Orders ED Discharge Orders     None

## 2023-05-22 NOTE — ED Triage Notes (Signed)
 Pt reports abdominal pain that worsened this afternoon associated with nausea and ongoing chronic lactulose . Pts son at bedside sts he is concern for pt "being out of it." She has had near syncopal episodes. Pt reports dizziness and lightheaded. Son reports sometimes pt is difficult to arouse.  Pt is alert in triage. Hx cirrhosis

## 2023-05-22 NOTE — ED Provider Triage Note (Signed)
 Emergency Medicine Provider Triage Evaluation Note  [name] , a 68 y.o. female  was evaluated in triage.  Pt complains of her mental status.  History of cirrhosis.  Family said they have been having to "shake her to wake her."  No recent injury or trauma.  Tried doing additional dose of lactulose  however only had 2 bowel movements.  No dysuria.  Review of Systems  Positive: ams Negative:   Physical Exam  BP 137/74   Pulse 77   Temp 98.6 F (37 C) (Oral)   Resp 14   SpO2 99%  Gen:   Awake, no distress   Resp:  Normal effort  MSK:   Moves extremities without difficulty  Other:    Medical Decision Making  Medically screening exam initiated at 7:48 PM.  Appropriate orders placed.  [name] was informed that the remainder of the evaluation will be completed by another provider, this initial triage assessment does not replace that evaluation, and the importance of remaining in the ED until their evaluation is complete.  ams   Doriann Zuch A, PA-C 05/22/23 1948

## 2023-05-23 ENCOUNTER — Encounter (HOSPITAL_COMMUNITY): Payer: Self-pay | Admitting: Internal Medicine

## 2023-05-23 DIAGNOSIS — N179 Acute kidney failure, unspecified: Secondary | ICD-10-CM | POA: Diagnosis not present

## 2023-05-23 DIAGNOSIS — G9341 Metabolic encephalopathy: Secondary | ICD-10-CM | POA: Diagnosis not present

## 2023-05-23 DIAGNOSIS — K7030 Alcoholic cirrhosis of liver without ascites: Secondary | ICD-10-CM | POA: Diagnosis not present

## 2023-05-23 DIAGNOSIS — D649 Anemia, unspecified: Secondary | ICD-10-CM | POA: Diagnosis not present

## 2023-05-23 DIAGNOSIS — D693 Immune thrombocytopenic purpura: Secondary | ICD-10-CM | POA: Diagnosis not present

## 2023-05-23 DIAGNOSIS — F411 Generalized anxiety disorder: Secondary | ICD-10-CM | POA: Insufficient documentation

## 2023-05-23 DIAGNOSIS — I9589 Other hypotension: Secondary | ICD-10-CM

## 2023-05-23 DIAGNOSIS — K7682 Hepatic encephalopathy: Secondary | ICD-10-CM

## 2023-05-23 DIAGNOSIS — K2100 Gastro-esophageal reflux disease with esophagitis, without bleeding: Secondary | ICD-10-CM | POA: Diagnosis not present

## 2023-05-23 DIAGNOSIS — E038 Other specified hypothyroidism: Secondary | ICD-10-CM | POA: Diagnosis not present

## 2023-05-23 DIAGNOSIS — E039 Hypothyroidism, unspecified: Secondary | ICD-10-CM | POA: Diagnosis not present

## 2023-05-23 DIAGNOSIS — E610 Copper deficiency: Secondary | ICD-10-CM

## 2023-05-23 DIAGNOSIS — G2581 Restless legs syndrome: Secondary | ICD-10-CM

## 2023-05-23 DIAGNOSIS — E7220 Disorder of urea cycle metabolism, unspecified: Secondary | ICD-10-CM | POA: Insufficient documentation

## 2023-05-23 DIAGNOSIS — G8929 Other chronic pain: Secondary | ICD-10-CM | POA: Insufficient documentation

## 2023-05-23 DIAGNOSIS — E785 Hyperlipidemia, unspecified: Secondary | ICD-10-CM | POA: Insufficient documentation

## 2023-05-23 DIAGNOSIS — M5441 Lumbago with sciatica, right side: Secondary | ICD-10-CM

## 2023-05-23 DIAGNOSIS — E7849 Other hyperlipidemia: Secondary | ICD-10-CM

## 2023-05-23 DIAGNOSIS — Z79899 Other long term (current) drug therapy: Secondary | ICD-10-CM | POA: Diagnosis not present

## 2023-05-23 DIAGNOSIS — D689 Coagulation defect, unspecified: Secondary | ICD-10-CM | POA: Insufficient documentation

## 2023-05-23 DIAGNOSIS — M5442 Lumbago with sciatica, left side: Secondary | ICD-10-CM

## 2023-05-23 DIAGNOSIS — R109 Unspecified abdominal pain: Secondary | ICD-10-CM | POA: Insufficient documentation

## 2023-05-23 LAB — URINALYSIS, W/ REFLEX TO CULTURE (INFECTION SUSPECTED)
Bilirubin Urine: NEGATIVE
Glucose, UA: NEGATIVE mg/dL
Ketones, ur: NEGATIVE mg/dL
Nitrite: NEGATIVE
Protein, ur: NEGATIVE mg/dL
Specific Gravity, Urine: 1.009 (ref 1.005–1.030)
pH: 5 (ref 5.0–8.0)

## 2023-05-23 LAB — COMPREHENSIVE METABOLIC PANEL WITH GFR
ALT: 23 U/L (ref 0–44)
AST: 31 U/L (ref 15–41)
Albumin: 2.6 g/dL — ABNORMAL LOW (ref 3.5–5.0)
Alkaline Phosphatase: 75 U/L (ref 38–126)
Anion gap: 7 (ref 5–15)
BUN: 29 mg/dL — ABNORMAL HIGH (ref 8–23)
CO2: 19 mmol/L — ABNORMAL LOW (ref 22–32)
Calcium: 8 mg/dL — ABNORMAL LOW (ref 8.9–10.3)
Chloride: 110 mmol/L (ref 98–111)
Creatinine, Ser: 1.15 mg/dL — ABNORMAL HIGH (ref 0.44–1.00)
GFR, Estimated: 53 mL/min — ABNORMAL LOW (ref >60)
Glucose, Bld: 90 mg/dL (ref 70–99)
Potassium: 3.6 mmol/L (ref 3.5–5.1)
Sodium: 136 mmol/L (ref 135–145)
Total Bilirubin: 2.2 mg/dL — ABNORMAL HIGH (ref 0.0–1.2)
Total Protein: 5.3 g/dL — ABNORMAL LOW (ref 6.5–8.1)

## 2023-05-23 LAB — CREATININE, URINE, RANDOM: Creatinine, Urine: 39 mg/dL

## 2023-05-23 LAB — CBC
HCT: 28.8 % — ABNORMAL LOW (ref 36.0–46.0)
Hemoglobin: 8.8 g/dL — ABNORMAL LOW (ref 12.0–15.0)
MCH: 29.4 pg (ref 26.0–34.0)
MCHC: 30.6 g/dL (ref 30.0–36.0)
MCV: 96.3 fL (ref 80.0–100.0)
Platelets: 127 10*3/uL — ABNORMAL LOW (ref 150–400)
RBC: 2.99 MIL/uL — ABNORMAL LOW (ref 3.87–5.11)
RDW: 17.2 % — ABNORMAL HIGH (ref 11.5–15.5)
WBC: 5.5 10*3/uL (ref 4.0–10.5)
nRBC: 0 % (ref 0.0–0.2)

## 2023-05-23 LAB — AMMONIA: Ammonia: 113 umol/L — ABNORMAL HIGH (ref 9–35)

## 2023-05-23 LAB — RAPID URINE DRUG SCREEN, HOSP PERFORMED
Amphetamines: NOT DETECTED
Barbiturates: NOT DETECTED
Benzodiazepines: NOT DETECTED
Cocaine: NOT DETECTED
Opiates: NOT DETECTED
Tetrahydrocannabinol: NOT DETECTED

## 2023-05-23 LAB — HIV ANTIBODY (ROUTINE TESTING W REFLEX): HIV Screen 4th Generation wRfx: NONREACTIVE

## 2023-05-23 LAB — SODIUM, URINE, RANDOM: Sodium, Ur: 108 mmol/L

## 2023-05-23 LAB — ETHANOL: Alcohol, Ethyl (B): <15 mg/dL (ref <15)

## 2023-05-23 MED ORDER — HYDROCODONE-ACETAMINOPHEN 10-325 MG PO TABS
1.0000 | ORAL_TABLET | Freq: Three times a day (TID) | ORAL | Status: DC | PRN
  Administered 2023-05-23 – 2023-05-28 (×12): 1 via ORAL
  Filled 2023-05-23 (×13): qty 1

## 2023-05-23 MED ORDER — COPPER 2 MG PO TABS
2.0000 mg | Freq: Two times a day (BID) | Status: DC
  Administered 2023-05-23 – 2023-05-29 (×13): 2 mg via ORAL
  Filled 2023-05-23 (×13): qty 1

## 2023-05-23 MED ORDER — SPIRONOLACTONE 25 MG PO TABS: 50.0000 mg | ORAL_TABLET | Freq: Every day | ORAL | Status: DC

## 2023-05-23 MED ORDER — ACETAMINOPHEN 325 MG PO TABS: 650.0000 mg | ORAL_TABLET | Freq: Four times a day (QID) | ORAL | Status: DC | PRN

## 2023-05-23 MED ORDER — DICYCLOMINE HCL 10 MG PO CAPS
10.0000 mg | ORAL_CAPSULE | Freq: Three times a day (TID) | ORAL | Status: DC | PRN
  Administered 2023-05-23 – 2023-05-28 (×4): 10 mg via ORAL
  Filled 2023-05-23 (×4): qty 1

## 2023-05-23 MED ORDER — LACTATED RINGERS IV SOLN: INTRAVENOUS | Status: AC

## 2023-05-23 MED ORDER — SODIUM CHLORIDE 0.9 % IV SOLN: 250.0000 mL | INTRAVENOUS | Status: AC | PRN

## 2023-05-23 MED ORDER — ACETAMINOPHEN 650 MG RE SUPP: 650.0000 mg | Freq: Four times a day (QID) | RECTAL | Status: DC | PRN

## 2023-05-23 MED ORDER — RIFAXIMIN 550 MG PO TABS
550.0000 mg | ORAL_TABLET | Freq: Two times a day (BID) | ORAL | Status: DC
  Administered 2023-05-23 – 2023-05-29 (×13): 550 mg via ORAL
  Filled 2023-05-23 (×13): qty 1

## 2023-05-23 MED ORDER — ACETAMINOPHEN 325 MG PO TABS
650.0000 mg | ORAL_TABLET | Freq: Four times a day (QID) | ORAL | Status: DC | PRN
  Administered 2023-05-23 (×3): 650 mg via ORAL
  Filled 2023-05-23 (×3): qty 2

## 2023-05-23 MED ORDER — FOLIC ACID 1 MG PO TABS
1.0000 mg | ORAL_TABLET | Freq: Every day | ORAL | Status: DC
  Administered 2023-05-23 – 2023-05-29 (×7): 1 mg via ORAL
  Filled 2023-05-23 (×7): qty 1

## 2023-05-23 MED ORDER — SODIUM CHLORIDE 0.9% FLUSH: 3.0000 mL | INTRAVENOUS | Status: DC | PRN

## 2023-05-23 MED ORDER — SODIUM CHLORIDE 0.9% FLUSH
3.0000 mL | Freq: Two times a day (BID) | INTRAVENOUS | Status: DC
  Administered 2023-05-23 – 2023-05-28 (×9): 3 mL via INTRAVENOUS

## 2023-05-23 MED ORDER — PROMETHAZINE HCL 25 MG PO TABS: 12.5000 mg | ORAL_TABLET | Freq: Four times a day (QID) | ORAL | Status: DC | PRN

## 2023-05-23 MED ORDER — MAGNESIUM GLUCONATE 500 (27 MG) MG PO TABS
500.0000 mg | ORAL_TABLET | Freq: Two times a day (BID) | ORAL | Status: DC
  Administered 2023-05-23 – 2023-05-29 (×13): 500 mg via ORAL
  Filled 2023-05-23 (×13): qty 1

## 2023-05-23 MED ORDER — LIDOCAINE 5 % EX PTCH
1.0000 | MEDICATED_PATCH | CUTANEOUS | Status: DC
  Administered 2023-05-24 – 2023-05-29 (×5): 1 via TRANSDERMAL
  Filled 2023-05-23 (×6): qty 1

## 2023-05-23 MED ORDER — LEVOTHYROXINE SODIUM 50 MCG PO TABS
175.0000 ug | ORAL_TABLET | Freq: Every day | ORAL | Status: DC
  Administered 2023-05-23 – 2023-05-29 (×7): 175 ug via ORAL
  Filled 2023-05-23 (×7): qty 1

## 2023-05-23 MED ORDER — MIDODRINE HCL 5 MG PO TABS: 10.0000 mg | ORAL_TABLET | Freq: Three times a day (TID) | ORAL | Status: DC

## 2023-05-23 MED ORDER — ALBUTEROL SULFATE HFA 108 (90 BASE) MCG/ACT IN AERS: 2.0000 | INHALATION_SPRAY | Freq: Four times a day (QID) | RESPIRATORY_TRACT | Status: DC | PRN

## 2023-05-23 MED ORDER — ATORVASTATIN CALCIUM 40 MG PO TABS
40.0000 mg | ORAL_TABLET | Freq: Every day | ORAL | Status: DC
  Administered 2023-05-23 – 2023-05-29 (×7): 40 mg via ORAL
  Filled 2023-05-23 (×7): qty 1

## 2023-05-23 MED ORDER — FERROUS SULFATE 325 (65 FE) MG PO TABS
325.0000 mg | ORAL_TABLET | Freq: Every day | ORAL | Status: DC
  Administered 2023-05-23 – 2023-05-29 (×7): 325 mg via ORAL
  Filled 2023-05-23 (×8): qty 1

## 2023-05-23 MED ORDER — SODIUM CHLORIDE 0.9% FLUSH
3.0000 mL | Freq: Two times a day (BID) | INTRAVENOUS | Status: DC
  Administered 2023-05-23 – 2023-05-27 (×3): 3 mL via INTRAVENOUS

## 2023-05-23 MED ORDER — ROPINIROLE HCL 0.25 MG PO TABS
0.2500 mg | ORAL_TABLET | Freq: Every day | ORAL | Status: DC
  Administered 2023-05-23 – 2023-05-28 (×6): 0.25 mg via ORAL
  Filled 2023-05-23 (×6): qty 1

## 2023-05-23 MED ORDER — VITAMIN B-12 1000 MCG PO TABS
2000.0000 ug | ORAL_TABLET | ORAL | Status: DC
  Administered 2023-05-24 – 2023-05-29 (×3): 2000 ug via ORAL
  Filled 2023-05-23 (×3): qty 2

## 2023-05-23 MED ORDER — LIDOCAINE 5 % EX PTCH
1.0000 | MEDICATED_PATCH | CUTANEOUS | Status: DC
Start: 2023-05-23 — End: 2023-05-23
  Administered 2023-05-23: 1 via TRANSDERMAL
  Filled 2023-05-23 (×2): qty 1

## 2023-05-23 MED ORDER — ROPINIROLE HCL 0.25 MG PO TABS
0.2500 mg | ORAL_TABLET | Freq: Once | ORAL | Status: AC
Start: 2023-05-23 — End: 2023-05-23
  Administered 2023-05-23: 0.25 mg via ORAL
  Filled 2023-05-23: qty 1

## 2023-05-23 MED ORDER — PANTOPRAZOLE SODIUM 40 MG PO TBEC
40.0000 mg | DELAYED_RELEASE_TABLET | Freq: Two times a day (BID) | ORAL | Status: DC
  Administered 2023-05-23 – 2023-05-29 (×13): 40 mg via ORAL
  Filled 2023-05-23 (×13): qty 1

## 2023-05-23 MED ORDER — IRON-VITAMIN C 100-250 MG PO TABS: ORAL_TABLET | Freq: Every day | ORAL | Status: DC

## 2023-05-23 MED ORDER — VENLAFAXINE HCL ER 75 MG PO CP24
75.0000 mg | ORAL_CAPSULE | Freq: Every day | ORAL | Status: DC
Start: 2023-05-23 — End: 2023-05-29
  Administered 2023-05-23 – 2023-05-29 (×7): 75 mg via ORAL
  Filled 2023-05-23 (×7): qty 1

## 2023-05-23 MED ORDER — VITAMIN C 500 MG PO TABS
250.0000 mg | ORAL_TABLET | Freq: Every day | ORAL | Status: DC
Start: 2023-05-23 — End: 2023-05-29
  Administered 2023-05-23 – 2023-05-29 (×7): 250 mg via ORAL
  Filled 2023-05-23 (×7): qty 1

## 2023-05-23 MED ORDER — LACTULOSE 10 GM/15ML PO SOLN
20.0000 g | Freq: Three times a day (TID) | ORAL | Status: DC
  Administered 2023-05-23 – 2023-05-24 (×3): 20 g via ORAL
  Filled 2023-05-23 (×4): qty 30

## 2023-05-23 MED ORDER — MEGESTROL ACETATE 40 MG PO TABS
40.0000 mg | ORAL_TABLET | Freq: Two times a day (BID) | ORAL | Status: DC
  Administered 2023-05-23 – 2023-05-29 (×13): 40 mg via ORAL
  Filled 2023-05-23 (×13): qty 1

## 2023-05-23 MED ORDER — FUROSEMIDE 40 MG PO TABS: 40.0000 mg | ORAL_TABLET | Freq: Two times a day (BID) | ORAL | Status: DC

## 2023-05-23 MED ORDER — ALBUTEROL SULFATE (2.5 MG/3ML) 0.083% IN NEBU: 2.5000 mg | INHALATION_SOLUTION | Freq: Four times a day (QID) | RESPIRATORY_TRACT | Status: DC | PRN

## 2023-05-23 NOTE — Plan of Care (Signed)

## 2023-05-23 NOTE — Plan of Care (Signed)

## 2023-05-23 NOTE — Progress Notes (Signed)
   05/23/23 1109  TOC Brief Assessment  Insurance and Status Reviewed  Patient has primary care physician Yes  Home environment has been reviewed single family home  Prior level of function: independent  Prior/Current Home Services No current home services  Social Drivers of Health Review SDOH reviewed no interventions necessary  Readmission risk has been reviewed Yes  Transition of care needs no transition of care needs at this time    [place], MSW, LCSW 05/23/2023 11:09 AM

## 2023-05-23 NOTE — Progress Notes (Signed)
 PROGRESS NOTE  [name]  WUJ:[id number] DOB: Aug 01, 1955 DOA: 05/22/2023 PCP: Amadeo June, MD  Consultants  Brief Narrative: 68 y.o. 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  Admitted for confusion after trip to [place] .  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 - Ammonia was elevated on recheck this a.m. - She denies having a bowel movement yet. - Continue lactulose  3 times daily. - She was oriented to being in [place] and knew why she was here.  Did not know date. - Per chart review patient has previous multiple hospital admission due to acute metabolic encephalopathy in the setting of hyperammonemia -Holding his Lasix  and spironolactone  in the setting of elevated creatinine. - Continue rifaximin  550 mg twice daily.  Continue midodrine  10 mg 3 times daily -Continue fall precaution and ambulatory assistance.   Acute kidney injury superimposed CKD stage II - Downtrending nicely. - She still not really eating and drinking much on her own therefore we will  continue IV fluids.   Chronic hypomagnesemia -Mag level within normal range.  Holding oral mag oxide in the setting of ongoing diarrhea and AKI.   History of copper  deficiency -Continue copper  2 mg twice daily   Short gut syndrome Abdominal wall hernia Chronic abdominal pain GERD - Continue Bentyl  and Protonix    Restless leg syndrome - Continue ropinirole    Chronic hypotension History of essential hypertension -Currently at home patient is on Lasix  and spironolactone  for management of both blood pressure and cirrhosis.  Currently holding Lasix /spironolactone  in setting of AKI. - Continue midodrine  10 mg 3 times daily   Generalized anxiety disorder -Continue Effexor  75 mg daily   Chronic anemia -Stable H&H 10.1 and 32.  Continue to monitor -Did drop to 8.  Will continue to monitor.   Chronic thrombocytopenia - Platelet count 162 within normal range now.   Hypothyroidism - Continue levothyroxine    Chronic back pain, neck pain and lower extremity pain - Continue Tylenol  as needed.  Applying lidocaine  patch to apply to to the lower back. continue heating pad.   DVT prophylaxis:  SCDs Start: 05/23/23 0050 Place TED hose Start: 05/23/23 0050  Code Status:   Code Status: Full Code Family Communication: Spoke with son who knows her very well.  I have spoken with him on previous admissions as well. Level of care: Telemetry Status is: Observation Dispo: Discharge home once confusion abates   Consults called: None  Subjective: Patient sleeping but easily awakened.  Reports being confused.  Does know where she is and why she is in the hospital.  No complaints other than chronic leg pain.  No nausea or vomiting.  Objective: Vitals:   05/23/23 0202 05/23/23 0313 05/23/23 0545 05/23/23 1553  BP: 138/62  (!) 116/57 122/61  Pulse: 82  89 74  Resp: 18  18 16  Temp: 98.9 F (37.2 C)  98.6 F (37 C) 98.2 F (36.8 C)  TempSrc: Oral  Oral   SpO2: 100%  97% 99%  Weight:   104 kg    Height:  5\' 2"  (1.575 m)      Intake/Output Summary (Last 24 hours) at 05/23/2023 1555 Last data filed at 05/23/2023 1050 Gross per 24 hour  Intake 532.56 ml  Output --  Net 532.56 ml   Filed Weights   05/23/23 0313  Weight: 104 kg   Body mass index is 41.94 kg/m.  Gen: 68 y.o. female in no apparent distress.  Nontoxic Pulm: Non-labored breathing.  Clear to auscultation bilaterally.  CV: Regular rate and rhythm. No murmur, rub, or gallop. No JVD GI: Abdomen soft, non-tender, non-distended, with normoactive bowel sounds. No organomegaly or masses felt. Ext: Warm, no deformities, no pedal edema Skin: No rashes, lesions no ulcers Neuro: Alert and oriented to person, place, not to date.  Did know why she was in the hospital. Psych: Calm  Judgement and insight appear normal. Mood & affect appropriate.     I have personally reviewed the following labs and images: CBC: Recent Labs  Lab 05/22/23 1958 05/23/23 0327  WBC 7.2 5.5  NEUTROABS 4.2  --   HGB 10.1* 8.8*  HCT 32.9* 28.8*  MCV 95.4 96.3  PLT 162 127*   BMP &GFR Recent Labs  Lab 05/22/23 1958 05/23/23 0327  NA 141 136  K 4.1 3.6  CL 113* 110  CO2 21* 19*  GLUCOSE 117* 90  BUN 28* 29*  CREATININE 1.30* 1.15*  CALCIUM  8.5* 8.0*  MG 2.2  --    Estimated Creatinine Clearance: 55.2 mL/min (A) (by C-G formula based on SCr of 1.15 mg/dL (H)). Liver & Pancreas: Recent Labs  Lab 05/22/23 1958 05/23/23 0327  AST 37 31  ALT 26 23  ALKPHOS 90 75  BILITOT 2.2* 2.2*  PROT 6.2* 5.3*  ALBUMIN  2.9* 2.6*   Recent Labs  Lab 05/22/23 1958  LIPASE 58*   Recent Labs  Lab 05/22/23 1958 05/23/23 0327  AMMONIA 129* 113*   Diabetic: No results for input(s): "HGBA1C" in the last 72 hours. No results for input(s): "GLUCAP" in the last 168 hours. Cardiac Enzymes: No results for input(s): "CKTOTAL", "CKMB", "CKMBINDEX", "TROPONINI" in the last 168 hours. No results for input(s): "PROBNP" in the last 8760  hours. Coagulation Profile: No results for input(s): "INR", "PROTIME" in the last 168 hours. Thyroid  Function Tests: No results for input(s): "TSH", "T4TOTAL", "FREET4", "T3FREE", "THYROIDAB" in the last 72 hours. Lipid Profile: No results for input(s): "CHOL", "HDL", "LDLCALC", "TRIG", "CHOLHDL", "LDLDIRECT" in the last 72 hours. Anemia Panel: No results for input(s): "VITAMINB12", "FOLATE", "FERRITIN", "TIBC", "IRON ", "RETICCTPCT" in the last 72 hours. Urine analysis:    Component Value Date/Time   COLORURINE YELLOW 05/23/2023 0253   APPEARANCEUR CLEAR 05/23/2023 0253   LABSPEC 1.009 05/23/2023 0253   PHURINE 5.0 05/23/2023 0253   GLUCOSEU NEGATIVE 05/23/2023 0253   HGBUR MODERATE (A) 05/23/2023 0253   BILIRUBINUR NEGATIVE 05/23/2023 0253   KETONESUR NEGATIVE 05/23/2023 0253   PROTEINUR NEGATIVE 05/23/2023 0253   UROBILINOGEN 0.2 08/21/2013 0017   NITRITE NEGATIVE 05/23/2023 0253   LEUKOCYTESUR TRACE (A) 05/23/2023 0253   Sepsis Labs: Invalid input(s): "PROCALCITONIN", "LACTICIDVEN"  Microbiology: No results found for this or any previous visit (from the past 240 hours).  Radiology Studies: CT HEAD WO CONTRAST ( ) Result Date: 05/22/2023 CLINICAL DATA:  Altered mental status EXAM: CT HEAD  WITHOUT CONTRAST TECHNIQUE: Contiguous axial images were obtained from the base of the skull through the vertex without intravenous contrast. RADIATION DOSE REDUCTION: This exam was performed according to the departmental dose-optimization program which includes automated exposure control, adjustment of the mA and/or kV according to patient size and/or use of iterative reconstruction technique. COMPARISON:  05/03/2023 FINDINGS: Brain: No evidence of acute infarction, hemorrhage, hydrocephalus, extra-axial collection or mass lesion/mass effect. Chronic white matter ischemic changes are noted stable from the prior exam. Vascular: No hyperdense vessel or unexpected calcification. Skull: Normal.  Negative for fracture or focal lesion. Sinuses/Orbits: No acute finding. Other: None. IMPRESSION: Chronic white matter ischemic changes.  No acute abnormality noted. Electronically Signed   By: Violeta Grey M.D.   On: 05/22/2023 20:43   DG Chest 2 View Result Date: 05/22/2023 CLINICAL DATA:  Altered mental status with nausea EXAM: CHEST - 2 VIEW COMPARISON:  05/03/2023 FINDINGS: Cardiac shadow is mildly prominent but accentuated by the frontal technique. Aortic calcifications are seen. The lungs are clear bilaterally. No effusion is seen. Changes of prior vertebral augmentation in the lower thoracic spine are noted. IMPRESSION: No active cardiopulmonary disease. Electronically Signed   By: Violeta Grey M.D.   On: 05/22/2023 20:23    Scheduled Meds:  ferrous sulfate   325 mg Oral Q breakfast   And   ascorbic acid   250 mg Oral Q breakfast   atorvastatin   40 mg Oral Daily   copper   2 mg Oral BID   [START ON 05/24/2023] cyanocobalamin   2,000 mcg Oral Q M,W,F   folic acid   1 mg Oral Daily   lactulose   20 g Oral TID   levothyroxine   175 mcg Oral Q0600   lidocaine   1 patch Transdermal Q24H   magnesium  gluconate  500 mg Oral BID   megestrol   40 mg Oral BID   pantoprazole   40 mg Oral BID AC   rifaximin   550 mg Oral BID   rOPINIRole   0.25 mg Oral QHS   sodium chloride  flush  3 mL Intravenous Q12H   sodium chloride  flush  3 mL Intravenous Q12H   venlafaxine  XR  75 mg Oral Q breakfast   Continuous Infusions:  sodium chloride      lactated ringers  50 mL/hr at 05/23/23 1050     LOS: 0 days   35 minutes with more than 50% spent in reviewing records, counseling patient/family and coordinating care.  Trenton Frock, MD [facility] [email or web address] 05/23/2023, 3:55 PM

## 2023-05-23 NOTE — H&P (Addendum)
 History and Physical    [name] EAV:[id number] DOB: 03/20/1955 DOA: 05/22/2023  PCP: Amadeo June, MD   Patient coming from: Home   Chief Complaint:  Chief Complaint  Patient presents with   Abdominal Pain   Near Syncope   ED TRIAGE note:  Pt reports abdominal pain that worsened this afternoon associated with nausea and ongoing chronic lactulose . Pts son at bedside sts he is concern for pt "being out of it." She has had near syncopal episodes. Pt reports dizziness and lightheaded. Son reports sometimes pt is difficult to arouse.  Pt is alert in triage. Hx cirrhosis       HPI:  [name] is a 68 y.o. 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 She presents to the emergency department accompanied by her son for evaluation of confusion that started around 9 AM.  They just returned from a trip to [place]  where they were there for 5 days.  Son reports that she was completely fine throughout the trip and when they arrived home she has been more somnolent and confused, forgetting words and recent events.  No reported falls, fevers.  She does complain of nausea but no vomiting.  He did give her an extra dose of lactulose  because her stool was solid this morning and she has had numerous bowel movements since receiving the medication.  No reported dysuria.  She does  have chronic abdominal pain-this is at her baseline.   During my evaluation at the bedside patient is completely alert oriented x 3.  She is complaining about neck pain, back pain and bilateral lower extremity pain however back pain is the worst.  Reported all the pain is chronic in nature.   ED Course:  At presentation to ED patient is hemodynamically stable Pending UA.  CBC normal leukocyte, stable H&H and platelet count 162. CMP low bicarb 31, elevated chloride of 113, elevated BUN 28, elevated creatinine 1.3, low albumin  2.9, and chronically elevated bilirubin 2.2. Elevated ammonia level 129. Normal acetaminophen  salicylate level. Elevated lipase 58.  CT head no acute intracranial abnormality.  Chronic white matter ischemic change. Chest x-ray no active disease process.  In the ED patient has been given lactulose  10 mg.  Hospitalist has been consulted for management of metabolic encephalopathy in the setting of hyperammonemia and AKI.   Significant labs in the ED: Lab Orders         CBC with Differential         Comprehensive metabolic panel         Ammonia         Urinalysis, w/ Reflex to Culture (Infection Suspected) -Urine, Clean Catch         Salicylate level         Acetaminophen  level         Magnesium          Lipase, blood         Comprehensive metabolic panel         CBC         HIV Antibody (routine testing  w rflx)         Ammonia         Sodium, urine, random         Creatinine, urine, random         Ethanol         Rapid urine drug screen (hospital performed)       Review of Systems:  Review of Systems  Constitutional:  Positive for malaise/fatigue. Negative for chills, fever and weight loss.  Respiratory:  Negative for cough, sputum production and shortness of breath.   Cardiovascular:  Negative for chest pain and palpitations.  Gastrointestinal:  Negative for abdominal pain, blood in stool, constipation, diarrhea, heartburn, melena, nausea and vomiting.   Genitourinary:  Negative for dysuria, flank pain, frequency, hematuria and urgency.  Musculoskeletal:  Positive for back pain and neck pain. Negative for falls, joint pain and myalgias.  Neurological:  Negative for dizziness and headaches.  Psychiatric/Behavioral:  The patient is not nervous/anxious.     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   Hyperammonemia (HCC) 09/30/2022   Hypertension    Kidney stone    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   Obesity    Pulmonary emboli (HCC) 01/10/2009   Pulmonary embolism (HCC)    Short gut syndrome    Thyroid  disease     Past Surgical History:  Procedure Laterality Date   ABDOMINAL WALL MESH  REMOVAL     2016   CESAREAN SECTION WITH BILATERAL TUBAL LIGATION  04/30/1985   CHOLECYSTECTOMY  05/2007   Exploratory laparotomy, lysis of adhesions, takedown of ileostomy with small bowel resection, open cholecystectomy and ileocolostomy   COLON SURGERY  01/2007   exploratory laparotomy with right colecotmy and end ileostomy   COLONOSCOPY  04/02/2019   Dr Lora Robinsons Ardeth Krabbe, MD [facility]   ESOPHAGOGASTRODUODENOSCOPY (EGD) WITH PROPOFOL  N/A 10/19/2022   Procedure: ESOPHAGOGASTRODUODENOSCOPY (EGD) WITH PROPOFOL ;  Surgeon: Genell Ken, MD;  Location: [facility];  Service: Gastroenterology;  Laterality: N/A;   ESOPHAGOSCOPY  04/02/2019   EYE SURGERY Left 10/06/2020   Cataract extraciton w/intraocular lens implant- Dr  Lunda Salines   HERNIA REPAIR  10/2007   ILEOSTOMY  2009   states bowel perfotation wih colonsocopy   ILEOSTOMY CLOSURE  2009   INCISIONAL HERNIA REPAIR  10/11/2018   IR KYPHO THORACIC WITH BONE BIOPSY  08/31/2022   IR KYPHO THORACIC WITH BONE BIOPSY  10/27/2022   IR RADIOLOGIST EVAL & MGMT  09/27/2022   KIDNEY STONE SURGERY     LIVER RESECTION     2009   PANNICULECTOMY     repair of abdominal wall hernia   WRIST SURGERY     tendonitis     reports that she quit smoking about 38 years ago. Her smoking use included cigarettes. She has been exposed to tobacco smoke. She has never used smokeless tobacco. She reports that she does not drink alcohol and does not use drugs.  Allergies  Allergen Reactions   Ace Inhibitors Anaphylaxis and Swelling   Ivp Dye [Iodinated Contrast Media] Anaphylaxis   Desitin [Zinc  Oxide] Rash and Other (See Comments)    Worsens rash   Gadolinium Derivatives Other (See Comments)    Reaction??   Hydroxyzine  Other (See Comments)    Affects the mind   Chlorhexidine  Rash and Other (See Comments)    WORSENS RASHES   Doxycycline Rash   Penicillins Rash   Zofran  [Ondansetron ] Rash    Family History  Problem Relation Age of Onset   Uterine cancer Mother    Bone cancer Father    Heart disease Father    Hyperlipidemia Father    Dementia Father    Hypertension Father    Skin cancer Sister    Hypertension Sister    Diabetes Sister    Hyperlipidemia Brother    Hypertension Brother    Heart disease Brother     Prior to Admission medications   Medication Sig Start Date End Date Taking? Authorizing Provider  albuterol  (VENTOLIN  HFA) 108 (90 Base) MCG/ACT inhaler Inhale 2 puffs into the lungs every 6 (six) hours as needed for shortness of breath or wheezing. 02/13/23   [provider]  atorvastatin  (LIPITOR) 40 MG tablet Take 1 tablet (40 mg total) by mouth in the morning. 02/22/23   Aguiar, Rafaela, MD  copper  tablet Take 2 mg by mouth in the morning  and at bedtime.    [provider]  cyanocobalamin  (VITAMIN B12) 1000 MCG tablet Take 2,000 mcg by mouth every Monday, Wednesday, and Friday.    [provider]  dicyclomine  (BENTYL ) 10 MG capsule Take 1 capsule (10 mg total) by mouth 3 (three) times daily as needed for spasms (abdominal cramping). 09/09/22   Faith Homes, MD  famotidine  (PEPCID ) 20 MG tablet Take 20 mg by mouth in the morning.    [provider]  folic acid  (FOLVITE ) 1 MG tablet Take 1 tablet (1 mg total) by mouth daily. 06/16/22   Rizwan, [name], MD  furosemide  (LASIX ) 20 MG tablet Take 3 tablets (60mg ) daily for 1 week,  then go back to home dose of 2 tablets (40 mg) daily. Take additional 2 tablets (40 mg) if there is a weight gain of 3 lbs in1 day or 5 lbs in 1 week. 04/14/23   Lesa Rape, MD  furosemide  (LASIX ) 40 MG tablet Take 1 tablet (40 mg total) by mouth 2 (two) times daily. 04/24/23   Amadeo June, MD  HYDROcodone -acetaminophen  (NORCO) 10-325 MG tablet Take 1 tablet by mouth every 8 (eight) hours as needed for moderate pain (pain score 4-6). 12/19/22   [provider]  IRON -VITAMIN C  PO Take 1 tablet by mouth daily with breakfast.    [provider]  lactulose  (CHRONULAC ) 10 GM/15ML solution Take 30 mLs (20 g total) by mouth 3 (three) times daily. Take to ensure that there is 2 loose bowel movement EVERY DAY Patient taking differently: Take 30 mLs by mouth 3 (three) times daily as needed (to ensure there are 2 loose bowel movements EVERY DAY). 10/31/22   Kraig Peru, MD  levothyroxine  (SYNTHROID ) 175 MCG tablet Take 1 tablet (175 mcg total) by mouth daily at 6 (six) AM. Recheck TSH with PCP in 4 weeks 04/03/23   [name], [name], DO  lidocaine  (LIDODERM ) 5 % Place 1 patch onto the skin daily. Remove & Discard patch within 12 hours or as directed by MD Patient taking differently: Place 1 patch onto the skin  daily as needed (for pain- Remove & Discard patch within 12 hours or as  directed by MD). 10/31/22   Kraig Peru, MD  magnesium  gluconate (MAGONATE) 500 (27 Mg) MG TABS tablet Take 1 tablet (500 mg total) by mouth in the morning and at bedtime. 05/01/23   Amadeo June, MD  megestrol  (MEGACE ) 40 MG tablet Take 1 tablet (40 mg total) by mouth 2 (two) times daily. 05/04/23   Ajewole, [name], MD  methocarbamol  (ROBAXIN ) 500 MG tablet Take 500 mg by mouth in the morning. 03/04/22   [provider]  midodrine  (PROAMATINE ) 10 MG tablet Take 1 tablet (10 mg total) by mouth 3 (three) times daily with meals. 09/09/22   Faith Homes, MD  Multiple Vitamins-Minerals (CENTRUM SILVER ULTRA WOMENS PO) Take 1 tablet by mouth in the morning.    [provider]  naloxone [facility]) nasal spray 4 mg/0.1 mL Place 1 spray into the nose as needed (accidental overdose). 03/25/22   [provider]  nystatin  cream (MYCOSTATIN ) Apply topically 2 (two) times daily. Patient taking differently: Apply 1 Application topically 2 (two) times daily as needed for dry skin. 01/13/23   Lonita Roach, MD  pantoprazole  (PROTONIX ) 40 MG tablet Take 1 tablet (40 mg total) by mouth 2 (two) times daily before a meal. 09/09/22   Faith Homes, MD  potassium chloride  (KLOR-CON ) 10 MEQ tablet Take 20 mEq by mouth 2 (two) times daily. 02/22/23   [provider]  promethazine  (PHENERGAN ) 12.5 MG tablet Take 12.5 mg by mouth every 6 (six) hours as needed for nausea or vomiting.    [provider]  rifaximin  (XIFAXAN ) 550 MG TABS tablet Take 550 mg by mouth 2 (two) times daily.    [provider]  rOPINIRole  (REQUIP ) 0.25 MG tablet Take 1 tablet (0.25 mg total) by mouth at bedtime. 05/02/23   Amadeo June, MD  spironolactone  (ALDACTONE ) 50 MG tablet Take 1 tablet (50 mg total) by mouth daily. 03/20/23   Amadeo June, MD  tamsulosin  (FLOMAX ) 0.4 MG CAPS capsule Take 1 capsule (0.4 mg total) by mouth daily. 09/10/22   Faith Homes, MD  triamcinolone  cream  (KENALOG ) 0.5 % Apply to affected area of right breast twice daily as directed. 05/10/22   Krishnan, Gokul, MD  venlafaxine  XR (EFFEXOR -XR) 75 MG 24 hr capsule Take 1 capsule (75 mg total) by mouth daily with breakfast. 04/21/23   Amadeo June, MD  Vitamin D, Ergocalciferol, (DRISDOL) 50000 UNITS CAPS Take 50,000 Units by mouth every Wednesday.    [provider]     Physical Exam: Vitals:   05/22/23 1922 05/22/23 2345  BP: 137/74 124/65  Pulse: 77 98  Resp: 14 16  Temp: 98.6 F (37 C) 98.5 F (36.9 C)  TempSrc: Oral   SpO2: 99% 99%    Physical Exam Constitutional:      General: She is not in acute distress.    Appearance: She is obese. She is ill-appearing.  Cardiovascular:     Rate and Rhythm: Normal rate and regular rhythm.  Pulmonary:     Effort: Pulmonary effort is normal.  Abdominal:     General: There is no distension.     Palpations: Abdomen is rigid.     Tenderness: There is no abdominal tenderness. There is no guarding or rebound.  Skin:    Capillary Refill: Capillary refill takes less than 2 seconds.  Neurological:     Mental Status: She is alert and oriented to person, place, and  time.  Psychiatric:        Mood and Affect: Mood normal. Mood is not anxious.        Behavior: Behavior normal.      Labs on Admission: I have personally reviewed following labs and imaging studies  CBC: Recent Labs  Lab 05/22/23 1958  WBC 7.2  NEUTROABS 4.2  HGB 10.1*  HCT 32.9*  MCV 95.4  PLT 162   Basic Metabolic Panel: Recent Labs  Lab 05/22/23 1958  NA 141  K 4.1  CL 113*  CO2 21*  GLUCOSE 117*  BUN 28*  CREATININE 1.30*  CALCIUM  8.5*  MG 2.2   GFR: CrCl cannot be calculated (Unknown ideal weight.). Liver Function Tests: Recent Labs  Lab 05/22/23 1958  AST 37  ALT 26  ALKPHOS 90  BILITOT 2.2*  PROT 6.2*  ALBUMIN  2.9*   Recent Labs  Lab 05/22/23 1958  LIPASE 58*   Recent Labs  Lab 05/22/23 1958  AMMONIA 129*   Coagulation  Profile: No results for input(s): "INR", "PROTIME" in the last 168 hours. Cardiac Enzymes: No results for input(s): "CKTOTAL", "CKMB", "CKMBINDEX", "TROPONINI", "TROPONINIHS" in the last 168 hours. BNP (last 3 results) Recent Labs    08/30/22 0354 01/03/23 0616  BNP 614.9* 469.4*   HbA1C: No results for input(s): "HGBA1C" in the last 72 hours. CBG: No results for input(s): "GLUCAP" in the last 168 hours. Lipid Profile: No results for input(s): "CHOL", "HDL", "LDLCALC", "TRIG", "CHOLHDL", "LDLDIRECT" in the last 72 hours. Thyroid  Function Tests: No results for input(s): "TSH", "T4TOTAL", "FREET4", "T3FREE", "THYROIDAB" in the last 72 hours. Anemia Panel: No results for input(s): "VITAMINB12", "FOLATE", "FERRITIN", "TIBC", "IRON ", "RETICCTPCT" in the last 72 hours. Urine analysis:    Component Value Date/Time   COLORURINE STRAW (A) 05/03/2023 1458   APPEARANCEUR CLEAR 05/03/2023 1458   LABSPEC 1.006 05/03/2023 1458   PHURINE 5.0 05/03/2023 1458   GLUCOSEU NEGATIVE 05/03/2023 1458   HGBUR NEGATIVE 05/03/2023 1458   BILIRUBINUR NEGATIVE 05/03/2023 1458   KETONESUR NEGATIVE 05/03/2023 1458   PROTEINUR NEGATIVE 05/03/2023 1458   UROBILINOGEN 0.2 08/21/2013 0017   NITRITE NEGATIVE 05/03/2023 1458   LEUKOCYTESUR SMALL (A) 05/03/2023 1458    Radiological Exams on Admission: I have personally reviewed images CT HEAD WO CONTRAST ( ) Result Date: 05/22/2023 CLINICAL DATA:  Altered mental status EXAM: CT HEAD WITHOUT CONTRAST TECHNIQUE: Contiguous axial images were obtained from the base of the skull through the vertex without intravenous contrast. RADIATION DOSE REDUCTION: This exam was performed according to the departmental dose-optimization program which includes automated exposure control, adjustment of the mA and/or kV according to patient size and/or use of iterative reconstruction technique. COMPARISON:  05/03/2023 FINDINGS: Brain: No evidence of acute infarction, hemorrhage,  hydrocephalus, extra-axial collection or mass lesion/mass effect. Chronic white matter ischemic changes are noted stable from the prior exam. Vascular: No hyperdense vessel or unexpected calcification. Skull: Normal. Negative for fracture or focal lesion. Sinuses/Orbits: No acute finding. Other: None. IMPRESSION: Chronic white matter ischemic changes.  No acute abnormality noted. Electronically Signed   By: Violeta Grey M.D.   On: 05/22/2023 20:43   DG Chest 2 View Result Date: 05/22/2023 CLINICAL DATA:  Altered mental status with nausea EXAM: CHEST - 2 VIEW COMPARISON:  05/03/2023 FINDINGS: Cardiac shadow is mildly prominent but accentuated by the frontal technique. Aortic calcifications are seen. The lungs are clear bilaterally. No effusion is seen. Changes of prior vertebral augmentation in the lower thoracic spine are noted. IMPRESSION: No active  cardiopulmonary disease. Electronically Signed   By: Violeta Grey M.D.   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)   Chronic idiopathic thrombocytopenia (HCC)   Chronic abdominal pain   Hyperlipidemia   Generalized anxiety disorder   Chronic anemia   Acute metabolic encephalopathy    Assessment and Plan: Acute metabolic encephalopathy-in the setting of hyperammonemia Hepatic cirrhosis due to NASH Coagulopathy due to hepatic cirrhosis Chronic thrombocytopenia secondary to cirrhosis Chronic abdominal pain History of GI bleed -Patient presented emergency department accompanied by patient's son due to worsening confusion/more forgetfulness,  near-syncope episode and loose stool in the setting of lactulose  use - At presentation to ED patient installment however able to carry conversation again does back to sleep - Hemodynamically stable - CT head no acute endocrine abnormality. - Chest x-ray unremarkable. - Checking UDS and blood alcohol level. - Per chart review patient has previous multiple hospital admission due to acute metabolic encephalopathy in the setting of hyperammonemia - CBC unremarkable.  CMP showing elevated creatinine 1.3 otherwise all parameters at baseline.  Slightly better lipase level.  Salicylate ammonia level within normal range. - Elevated ammonia 129. -In the ED patient has been given lactulose  10 mg - Continue home lactulose  20 mg 3 times daily. - Will check ammonia level in the morning - Continue serial neurocheck every 4 hours and monitor for improvement of mental status. -Holding his Lasix  and spironolactone  in the setting of elevated creatinine. - Continue rifaximin  550 mg twice daily.  Continue midodrine  10 mg 3 times daily -Continue fall precaution and ambulatory assistance.  Acute kidney injury superimposed CKD stage II Elevated creatinine 1.3 and GFR 46.  Prerenal acute kidney injury in the setting of loose stool due to lactulose  use however it is the goal to use the lactulose  as it will improve ammonia level and ultimately will improve the mental status. -Starting LR 75 cc/h.  Pending UA, urine creatinine and urine sodium level.  Continue to monitor improvement of renal function, avoid nephrotoxic agent, additionally adjust medication.  Chronic hypomagnesemia -Mag level within normal range.  Holding oral mag oxide in the setting of ongoing diarrhea and AKI.  History of copper  deficiency -Continue copper  2 mg twice daily  Short gut syndrome Abdominal wall hernia Chronic abdominal pain GERD - Continue Bentyl  and Protonix    Restless leg syndrome - Continue ropinirole   Chronic  hypotension History of essential hypertension -Currently at home patient is on Lasix  and spironolactone  for management of both blood pressure and cirrhosis.  Currently holding Lasix /spironolactone  in setting of AKI. - Continue midodrine  10 mg 3 times daily  Generalized anxiety disorder -Continue Effexor  75 mg daily  Chronic anemia -Stable H&H 10.1 and 32.  Continue to monitor  Chronic thrombocytopenia - Platelet count 162 within normal range now.  Hypothyroidism - Continue levothyroxine   Chronic back pain, neck pain and lower extremity pain - Continue Tylenol  as needed.  Applying lidocaine  patch to apply to to the lower back. continue heating pad.  DVT prophylaxis:  SCDs Code Status:  Full Code Diet: Heart healthy diet with  fluid restriction 2L/day and salt restriction 2 g/day Family Communication: Currently no family member at bedside Disposition Plan: Continue monitor improvement of ammonia level with lactulose .  Monitor improvement of renal function Consults: None at this time Admission status:   Observation, Telemetry bed  Severity of Illness: The appropriate patient status for this patient is OBSERVATION. Observation status is judged to be reasonable and necessary in order to provide the required intensity of service to ensure the patient's safety. The patient's presenting symptoms, physical exam findings, and initial radiographic and laboratory data in the context of their medical condition is felt to place them at decreased risk for further clinical deterioration. Furthermore, it is anticipated that the patient will be medically stable for discharge from the hospital within 2 midnights of admission.     Jailen Coward, MD [facility]  How to contact the [facility] Attending or Consulting provider 7A - 7P or covering provider during after hours 7P -7A, for this patient.  Check the care team in [facility] and look for a) attending/consulting [facility] provider listed and b) the [facility] team  listed Log into [email or web address] and use [place]'s universal password to access. If you do not have the password, please contact the hospital operator. Locate the [facility] provider you are looking for under [facility] and page to a number that you can be directly reached. If you still have difficulty reaching the provider, please page the [facility] (Director on Call) for the Hospitalists listed on amion for assistance.  05/23/2023, 1:42 AM

## 2023-05-24 DIAGNOSIS — K7682 Hepatic encephalopathy: Secondary | ICD-10-CM | POA: Diagnosis not present

## 2023-05-24 LAB — COMPREHENSIVE METABOLIC PANEL WITH GFR
ALT: 21 U/L (ref 0–44)
AST: 31 U/L (ref 15–41)
Albumin: 2.3 g/dL — ABNORMAL LOW (ref 3.5–5.0)
Alkaline Phosphatase: 80 U/L (ref 38–126)
Anion gap: 6 (ref 5–15)
BUN: 28 mg/dL — ABNORMAL HIGH (ref 8–23)
CO2: 20 mmol/L — ABNORMAL LOW (ref 22–32)
Calcium: 8.2 mg/dL — ABNORMAL LOW (ref 8.9–10.3)
Chloride: 113 mmol/L — ABNORMAL HIGH (ref 98–111)
Creatinine, Ser: 1.25 mg/dL — ABNORMAL HIGH (ref 0.44–1.00)
GFR, Estimated: 48 mL/min — ABNORMAL LOW (ref >60)
Glucose, Bld: 122 mg/dL — ABNORMAL HIGH (ref 70–99)
Potassium: 3.6 mmol/L (ref 3.5–5.1)
Sodium: 139 mmol/L (ref 135–145)
Total Bilirubin: 1.8 mg/dL — ABNORMAL HIGH (ref 0.0–1.2)
Total Protein: 5.1 g/dL — ABNORMAL LOW (ref 6.5–8.1)

## 2023-05-24 LAB — CBC
HCT: 26.7 % — ABNORMAL LOW (ref 36.0–46.0)
Hemoglobin: 8.2 g/dL — ABNORMAL LOW (ref 12.0–15.0)
MCH: 29.6 pg (ref 26.0–34.0)
MCHC: 30.7 g/dL (ref 30.0–36.0)
MCV: 96.4 fL (ref 80.0–100.0)
Platelets: 105 10*3/uL — ABNORMAL LOW (ref 150–400)
RBC: 2.77 MIL/uL — ABNORMAL LOW (ref 3.87–5.11)
RDW: 17.2 % — ABNORMAL HIGH (ref 11.5–15.5)
WBC: 4.5 10*3/uL (ref 4.0–10.5)
nRBC: 0 % (ref 0.0–0.2)

## 2023-05-24 MED ORDER — LACTULOSE 10 GM/15ML PO SOLN
30.0000 g | Freq: Three times a day (TID) | ORAL | Status: DC
  Administered 2023-05-24 – 2023-05-28 (×12): 30 g via ORAL
  Filled 2023-05-24 (×12): qty 45

## 2023-05-24 MED ORDER — HYDROCORTISONE 1 % EX CREA
TOPICAL_CREAM | Freq: Three times a day (TID) | CUTANEOUS | Status: DC | PRN
  Filled 2023-05-24: qty 28

## 2023-05-24 MED ORDER — MORPHINE SULFATE (PF) 2 MG/ML IV SOLN
1.0000 mg | INTRAVENOUS | Status: DC | PRN
  Administered 2023-05-24 – 2023-05-25 (×2): 1 mg via INTRAVENOUS
  Filled 2023-05-24 (×2): qty 1

## 2023-05-24 NOTE — Plan of Care (Signed)
   Problem: Activity: Goal: Risk for activity intolerance will decrease Outcome: Progressing   Problem: Nutrition: Goal: Adequate nutrition will be maintained Outcome: Progressing   Problem: Coping: Goal: Level of anxiety will decrease Outcome: Progressing

## 2023-05-24 NOTE — Plan of Care (Signed)

## 2023-05-24 NOTE — Progress Notes (Signed)
 Mobility Specialist - Progress Note   05/24/23 1100  Mobility  Activity Ambulated with assistance in hallway  Level of Assistance Contact guard assist, steadying assist  Assistive Device Front wheel walker  Distance Ambulated (ft) 120 ft  Range of Motion/Exercises Active  Activity Response Tolerated well  Mobility Referral Yes  Mobility visit 1 Mobility  Mobility Specialist Start Time (ACUTE ONLY) 1045  Mobility Specialist Stop Time (ACUTE ONLY) 1103  Mobility Specialist Time Calculation (min) (ACUTE ONLY) 18 min   Received in bed and agreed to mobility. Standby for bed mobility and sit to stand. Contact nearing EOS as pt began to fatigue, returned to bed with all needs met.  Ivonne Marry Mobility Specialist

## 2023-05-24 NOTE — Progress Notes (Signed)
 PROGRESS NOTE  [name]  ZOX:[id number] DOB: 04-01-1955 DOA: 05/22/2023 PCP: Amadeo June, MD  Consultants  Brief Narrative: 68 y.o. 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  Admitted for confusion after trip to [place] .  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  But still not at baseline per both patient herself and her son at bedside. - Increasing her lactulose  today to 30 3 times daily which is her home dose.  She was currently getting 20. - Per chart review patient has previous multiple hospital admission due to acute metabolic encephalopathy in the setting of hyperammonemia -Holding Lasix  and spironolactone  in the setting of elevated creatinine.  Plan to restart prior to discharge - Continue rifaximin  550 mg twice daily.  Continue midodrine  10 mg 3 times daily -Continue fall precaution and ambulatory assistance.   Acute kidney injury superimposed CKD stage II - She still not really eating and drinking much on her own therefore we will continue IV fluids. - Trending creatinine.   Has increased today 1.25 from 1.15 yesterday.  1.3 on admit   Chronic hypomagnesemia -Mag level within normal range.  Holding oral mag oxide in the setting of ongoing diarrhea and AKI.   History of copper  deficiency -Continue copper  2 mg twice daily   Short gut syndrome Abdominal wall hernia Chronic abdominal pain GERD - Continue Bentyl  and Protonix    Restless leg syndrome - Continue ropinirole    Chronic hypotension History of essential hypertension -Currently at home patient is on Lasix  and spironolactone  for management of both blood pressure and cirrhosis.  Currently holding Lasix /spironolactone  in setting of AKI. - Continue midodrine  10 mg 3 times daily   Generalized anxiety disorder -Continue Effexor  75 mg daily   Chronic anemia -Stable H&H 10.1 and 32.  Continue to monitor -Did drop to 8.  Will continue to monitor.   Chronic thrombocytopenia - Platelet count 162 within normal range now.   Hypothyroidism - Continue levothyroxine    Chronic back pain, neck pain and lower extremity pain - Continue Tylenol  as needed.  Applying lidocaine  patch to apply to to the lower back. continue heating pad. - She is on her home hydrocodone  and added low-dose morphine  today as she was still having some leg pain worse than her baseline according to her son.  Chronic acidosis: - present most prior admissions as well, seems to be around 19 - 21 at baseline - Going back to at least 2024 - Likely from renal disease CKD Stage 3a/b - recommend outpt nephro follow-up, defer bicarb to outpt nephro  Hematuria: - present on dipstick, but only 6 - 10 RBCs on micro - no symptoms of UTI - Recommend repeat U/A in 2 weeks outpt to assess for resolution of hematuria   DVT prophylaxis:  SCDs Start: 05/23/23 0050 Place TED hose Start: 05/23/23 0050  Code Status:  Code Status: Full Code Family Communication: Spoke with son at bedside who knows her very well. Level of care: Telemetry Status  is: Observation Dispo: Discharge home once confusion abates   Consults called: None  Subjective: Patient more awake and alert today.  Still not quite at baseline per her and son's report.  Has leg pain that is little worse today than yesterday.  Only 2 bowel movements yesterday, none today.  Objective: Vitals:   05/23/23 0545 05/23/23 1553 05/23/23 2118 05/24/23 0430  BP: (!) 116/57 122/61 (!) 155/62 (!) 123/46  Pulse: 89 74 75 68  Resp: 18 16 18 18   Temp: 98.6 F (37 C) 98.2 F (36.8 C) 97.8 F (36.6 C) 97.8 F (36.6 C)  TempSrc: Oral  Oral Oral  SpO2: 97% 99% 98% 100%  Weight:      Height:        Intake/Output Summary (Last 24 hours) at 05/24/2023 0750 Last data filed at 05/24/2023 0600 Gross per 24 hour  Intake 1216.12 ml  Output --  Net 1216.12 ml   Filed Weights   05/23/23 0313  Weight: 104 kg   Body mass index is 41.94 kg/m.  Gen: 68 y.o. female in no apparent distress.  Nontoxic.  Chronically ill-appearing.  Left eye cataract is chronic Pulm: Non-labored breathing.  Clear to auscultation bilaterally.  CV: Regular rate and rhythm. No murmur, rub, or gallop. No JVD GI: Abdomen soft, non-tender, non-distended, with normoactive bowel sounds. No organomegaly or masses felt. Ext: Warm, no deformities, no pedal edema Skin: No rashes, lesions  Neuro: Alert and oriented to person, place, not to date.  Moving all limbs symmetrically. Psych: Calm  Judgement and insight appear normal. Mood & affect appropriate.     I have personally reviewed the following labs and images: CBC: Recent Labs  Lab 05/22/23 1958 05/23/23 0327 05/24/23 0338  WBC 7.2 5.5 4.5  NEUTROABS 4.2  --   --   HGB 10.1* 8.8* 8.2*  HCT 32.9* 28.8* 26.7*  MCV 95.4 96.3 96.4  PLT 162 127* 105*   BMP &GFR Recent Labs  Lab 05/22/23 1958 05/23/23 0327 05/24/23 0338  NA 141 136 139  K 4.1 3.6 3.6  CL 113* 110 113*  CO2 21* 19* 20*  GLUCOSE 117* 90 122*  BUN 28* 29* 28*  CREATININE 1.30*  1.15* 1.25*  CALCIUM  8.5* 8.0* 8.2*  MG 2.2  --   --    Estimated Creatinine Clearance: 50.8 mL/min (A) (by C-G formula based on SCr of 1.25 mg/dL (H)). Liver & Pancreas: Recent Labs  Lab 05/22/23 1958 05/23/23 0327 05/24/23 0338  AST 37 31 31  ALT 26 23 21   ALKPHOS 90 75 80  BILITOT 2.2* 2.2* 1.8*  PROT 6.2* 5.3* 5.1*  ALBUMIN  2.9* 2.6* 2.3*   Recent Labs  Lab 05/22/23 1958  LIPASE 58*   Recent Labs  Lab 05/22/23 1958 05/23/23 0327  AMMONIA 129* 113*   Diabetic: No results for input(s): "HGBA1C" in the last 72 hours. No results for input(s): "GLUCAP" in the last 168 hours. Cardiac Enzymes: No results for input(s): "CKTOTAL", "CKMB", "CKMBINDEX", "TROPONINI" in the last 168 hours. No results for input(s): "PROBNP" in the last 8760 hours. Coagulation Profile: No results for input(s): "INR", "PROTIME" in the last 168 hours. Thyroid  Function Tests: No results for input(s): "TSH", "T4TOTAL", "FREET4", "T3FREE", "THYROIDAB" in the last 72 hours. Lipid Profile: No results for input(s): "CHOL", "HDL", "LDLCALC", "TRIG", "CHOLHDL", "LDLDIRECT" in the last 72 hours. Anemia Panel:  No results for input(s): "VITAMINB12", "FOLATE", "FERRITIN", "TIBC", "IRON ", "RETICCTPCT" in the last 72 hours. Urine analysis:    Component Value Date/Time   COLORURINE YELLOW 05/23/2023 0253   APPEARANCEUR CLEAR 05/23/2023 0253   LABSPEC 1.009 05/23/2023 0253   PHURINE 5.0 05/23/2023 0253   GLUCOSEU NEGATIVE 05/23/2023 0253   HGBUR MODERATE (A) 05/23/2023 0253   BILIRUBINUR NEGATIVE 05/23/2023 0253   KETONESUR NEGATIVE 05/23/2023 0253   PROTEINUR NEGATIVE 05/23/2023 0253   UROBILINOGEN 0.2 08/21/2013 0017   NITRITE NEGATIVE 05/23/2023 0253   LEUKOCYTESUR TRACE (A) 05/23/2023 0253   Sepsis Labs: Invalid input(s): "PROCALCITONIN", "LACTICIDVEN"  Microbiology: No results found for this or any previous visit (from the past 240 hours).  Radiology Studies: No results found.   Scheduled  Meds:  ferrous sulfate   325 mg Oral Q breakfast   And   ascorbic acid   250 mg Oral Q breakfast   atorvastatin   40 mg Oral Daily   copper   2 mg Oral BID   cyanocobalamin   2,000 mcg Oral Q M,W,F   folic acid   1 mg Oral Daily   lactulose   20 g Oral TID   levothyroxine   175 mcg Oral Q0600   lidocaine   1 patch Transdermal Q24H   magnesium  gluconate  500 mg Oral BID   megestrol   40 mg Oral BID   pantoprazole   40 mg Oral BID AC   rifaximin   550 mg Oral BID   rOPINIRole   0.25 mg Oral QHS   sodium chloride  flush  3 mL Intravenous Q12H   sodium chloride  flush  3 mL Intravenous Q12H   venlafaxine  XR  75 mg Oral Q breakfast   Continuous Infusions:     LOS: 0 days   35 minutes with more than 50% spent in reviewing records, counseling patient/family and coordinating care.  Trenton Frock, MD [facility] [email or web address] 05/24/2023, 7:50 AM

## 2023-05-24 NOTE — Care Management Obs Status (Signed)
 MEDICARE OBSERVATION STATUS NOTIFICATION   Patient Details  Name: [name] MRN: [id number] Date of Birth: [birth year]   Medicare Observation Status Notification Given:  Birda Buffy, LCSW 05/24/2023, 9:53 AM

## 2023-05-25 DIAGNOSIS — K7682 Hepatic encephalopathy: Secondary | ICD-10-CM | POA: Diagnosis not present

## 2023-05-25 LAB — CBC
HCT: 29.8 % — ABNORMAL LOW (ref 36.0–46.0)
Hemoglobin: 8.7 g/dL — ABNORMAL LOW (ref 12.0–15.0)
MCH: 28.9 pg (ref 26.0–34.0)
MCHC: 29.2 g/dL — ABNORMAL LOW (ref 30.0–36.0)
MCV: 99 fL (ref 80.0–100.0)
Platelets: 114 10*3/uL — ABNORMAL LOW (ref 150–400)
RBC: 3.01 MIL/uL — ABNORMAL LOW (ref 3.87–5.11)
RDW: 17.2 % — ABNORMAL HIGH (ref 11.5–15.5)
WBC: 4.2 10*3/uL (ref 4.0–10.5)
nRBC: 0 % (ref 0.0–0.2)

## 2023-05-25 LAB — COMPREHENSIVE METABOLIC PANEL WITH GFR
ALT: 22 U/L (ref 0–44)
AST: 31 U/L (ref 15–41)
Albumin: 2.4 g/dL — ABNORMAL LOW (ref 3.5–5.0)
Alkaline Phosphatase: 80 U/L (ref 38–126)
Anion gap: 7 (ref 5–15)
BUN: 24 mg/dL — ABNORMAL HIGH (ref 8–23)
CO2: 20 mmol/L — ABNORMAL LOW (ref 22–32)
Calcium: 8.3 mg/dL — ABNORMAL LOW (ref 8.9–10.3)
Chloride: 112 mmol/L — ABNORMAL HIGH (ref 98–111)
Creatinine, Ser: 1.01 mg/dL — ABNORMAL HIGH (ref 0.44–1.00)
GFR, Estimated: >60 mL/min (ref >60)
Glucose, Bld: 97 mg/dL (ref 70–99)
Potassium: 3.6 mmol/L (ref 3.5–5.1)
Sodium: 139 mmol/L (ref 135–145)
Total Bilirubin: 1.7 mg/dL — ABNORMAL HIGH (ref 0.0–1.2)
Total Protein: 5.2 g/dL — ABNORMAL LOW (ref 6.5–8.1)

## 2023-05-25 MED ORDER — MIDODRINE HCL 5 MG PO TABS
10.0000 mg | ORAL_TABLET | Freq: Three times a day (TID) | ORAL | Status: DC
  Administered 2023-05-25 – 2023-05-29 (×12): 10 mg via ORAL
  Filled 2023-05-25 (×13): qty 2

## 2023-05-25 MED ORDER — FUROSEMIDE 40 MG PO TABS
40.0000 mg | ORAL_TABLET | Freq: Two times a day (BID) | ORAL | Status: DC
  Administered 2023-05-25 (×2): 40 mg via ORAL
  Filled 2023-05-25 (×2): qty 1

## 2023-05-25 NOTE — Progress Notes (Signed)
 PROGRESS NOTE    [name]  IHK:[id number] DOB: Mar 12, 1955 DOA: 05/22/2023 PCP: Amadeo June, MD   Brief Narrative:  68 y.o. female with history of liver cirrhosis secondary to [facility], hypothyroidism, hyperlipidemia, GERD, chronic anemia and thrombocytopenia, recurrent admissions for hepatic encephalopathy, coagulopathy, chronic abdominal pain, restless leg syndrome, essential hypertension, chronic hypotension, GERD, generalized anxiety disorder, abdominal wall hernia, short gut syndrome, chronic copper  deficiency and morbid obesity presented with confusion and she was admitted for hepatic encephalopathy and started on lactulose .  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  Mental status slightly improving.  Continue rifaximin . -Lasix  and spironolactone  on hold because of elevated creatinine.  Blood pressure still intermittently on the low side.  Will resume Lasix  today. - Patient does not feel ready to be discharged home today.  Will have physical therapy evaluate her today - No signs of bleeding.  Monitor platelets intermittently  Acute kidney injury on chronic kidney disease stage II - Improving.  Creatinine 1.01 today.  Treated with IV fluids and discontinued.  Diuretic plan as above.  Monitor  History of hypertension Chronic hypotension - Blood pressure intermittently on the lower side.  Lasix  plan as above - Might have to resume midodrine  if blood pressure continues to be low  Short gut syndrome Abdominal wall hernia Chronic abdominal pain GERD - Continue Bentyl  and Protonix .  Outpatient follow-up with GI  Restless leg syndrome - Continue Requip   Chronic hypomagnesemia - Continue supplementation  Hypothyroidism -Continue  levothyroxine   Generalized anxiety disorder - Continue venlafaxine  XR  Anemia of chronic disease - From chronic illnesses.  Hemoglobin stable.  Monitor intermittently  Physical deconditioning - PT eval  Chronic metabolic acidosis - Bicarb currently stable, slightly on the lower side.  Recommend outpatient evaluation and follow-up by nephrology  History of copper  deficiency - Continue supplementation  Obesity class III - Outpatient follow-up   DVT prophylaxis: SCDs Code Status: Full Family Communication: None at bedside Disposition Plan: Status is: Observation The patient will require care spanning > 2 midnights and should be moved to inpatient because: Of severity of illness    Consultants: None  Procedures: None  Antimicrobials: None   Subjective: Patient seen and examined at bedside.  Does not feel ready to go home today.  Complains of bilateral lower extremity pain and some abdominal pain.  No fever, seizures or vomiting reported.  Objective: Vitals:   05/24/23 1311 05/24/23 2246 05/25/23 0101 05/25/23 0511  BP: 138/65 (!) 140/98 (!) 106/51 (!) 113/59  Pulse: 80 64 63 83  Resp: 17 18  16   Temp:  98 F (36.7 C) 98.1 F (36.7 C) 98.2 F (36.8 C)  TempSrc:  Oral Oral   SpO2: 100% 100% 99% 100%  Weight:      Height:        Intake/Output Summary (Last 24 hours) at 05/25/2023 1011 Last data filed at 05/25/2023 0900 Gross per 24 hour  Intake 570 ml  Output --  Net 570 ml   Filed Weights   05/23/23 0313  Weight: 104 kg    Examination:  General exam: Appears calm and comfortable.  Looks chronically ill and deconditioned. Respiratory system: Bilateral decreased breath sounds at bases, no wheezing Cardiovascular system: S1 & S2 heard, Rate controlled Gastrointestinal system: Abdomen is morbidly obese, distended, soft and  nontender. Normal bowel sounds heard. Extremities: No cyanosis, clubbing; trace lower extremity edema present Central nervous system:  Alert and oriented.  Slow to respond.  Poor historian.  No focal neurological deficits. Moving extremities Skin: No rashes, lesions or ulcers Psychiatry: Flat affect.  Not agitated.   Data Reviewed: I have personally reviewed following labs and imaging studies  CBC: Recent Labs  Lab 05/22/23 1958 05/23/23 0327 05/24/23 0338 05/25/23 0321  WBC 7.2 5.5 4.5 4.2  NEUTROABS 4.2  --   --   --   HGB 10.1* 8.8* 8.2* 8.7*  HCT 32.9* 28.8* 26.7* 29.8*  MCV 95.4 96.3 96.4 99.0  PLT 162 127* 105* 114*   Basic Metabolic Panel: Recent Labs  Lab 05/22/23 1958 05/23/23 0327 05/24/23 0338 05/25/23 0321  NA 141 136 139 139  K 4.1 3.6 3.6 3.6  CL 113* 110 113* 112*  CO2 21* 19* 20* 20*  GLUCOSE 117* 90 122* 97  BUN 28* 29* 28* 24*  CREATININE 1.30* 1.15* 1.25* 1.01*  CALCIUM  8.5* 8.0* 8.2* 8.3*  MG 2.2  --   --   --    GFR: Estimated Creatinine Clearance: 62.9 mL/min (A) (by C-G formula based on SCr of 1.01 mg/dL (H)). Liver Function Tests: Recent Labs  Lab 05/22/23 1958 05/23/23 0327 05/24/23 0338 05/25/23 0321  AST 37 31 31 31   ALT 26 23 21 22   ALKPHOS 90 75 80 80  BILITOT 2.2* 2.2* 1.8* 1.7*  PROT 6.2* 5.3* 5.1* 5.2*  ALBUMIN  2.9* 2.6* 2.3* 2.4*   Recent Labs  Lab 05/22/23 1958  LIPASE 58*   Recent Labs  Lab 05/22/23 1958 05/23/23 0327  AMMONIA 129* 113*   Coagulation Profile: No results for input(s): "INR", "PROTIME" in the last 168 hours. Cardiac Enzymes: No results for input(s): "CKTOTAL", "CKMB", "CKMBINDEX", "TROPONINI" in the last 168 hours. BNP (last 3 results) No results for input(s): "PROBNP" in the last 8760 hours. HbA1C: No results for input(s): "HGBA1C" in the last 72 hours. CBG: No results for input(s): "GLUCAP" in the last 168 hours. Lipid Profile: No results for input(s): "CHOL", "HDL", "LDLCALC", "TRIG", "CHOLHDL", "LDLDIRECT" in the last 72 hours. Thyroid  Function Tests: No results for input(s): "TSH", "T4TOTAL", "FREET4", "T3FREE",  "THYROIDAB" in the last 72 hours. Anemia Panel: No results for input(s): "VITAMINB12", "FOLATE", "FERRITIN", "TIBC", "IRON ", "RETICCTPCT" in the last 72 hours. Sepsis Labs: No results for input(s): "PROCALCITON", "LATICACIDVEN" in the last 168 hours.  No results found for this or any previous visit (from the past 240 hours).       Radiology Studies: No results found.      Scheduled Meds:  ferrous sulfate   325 mg Oral Q breakfast   And   ascorbic acid   250 mg Oral Q breakfast   atorvastatin   40 mg Oral Daily   copper   2 mg Oral BID   cyanocobalamin   2,000 mcg Oral Q M,W,F   folic acid   1 mg Oral Daily   lactulose   30 g Oral TID   levothyroxine   175 mcg Oral Q0600   lidocaine   1 patch Transdermal Q24H   magnesium  gluconate  500 mg Oral BID   megestrol   40 mg Oral BID   pantoprazole   40 mg Oral BID AC   rifaximin   550 mg Oral BID   rOPINIRole   0.25 mg Oral QHS   sodium chloride  flush  3 mL Intravenous Q12H   sodium chloride  flush  3 mL Intravenous Q12H   venlafaxine  XR  75  mg Oral Q breakfast   Continuous Infusions:        Audria Leather, MD [facility] 05/25/2023, 10:11 AM

## 2023-05-25 NOTE — Progress Notes (Signed)
 Mobility Specialist - Progress Note   05/25/23 1129  Mobility  Activity Ambulated with assistance in hallway  Level of Assistance Standby assist, set-up cues, supervision of patient - no hands on  Assistive Device Front wheel walker  Distance Ambulated (ft) 160 ft  Activity Response Tolerated well  Mobility Referral Yes  Mobility visit 1 Mobility  Mobility Specialist Start Time (ACUTE ONLY) 1102  Mobility Specialist Stop Time (ACUTE ONLY) 1128  Mobility Specialist Time Calculation (min) (ACUTE ONLY) 26 min   Pt received on BSC and agreeable to mobility. Pt took x1 standing rest break d/t fatigue. No complaints during session. Pt to recliner after session with all needs met. NT in room.     [facility]

## 2023-05-25 NOTE — Progress Notes (Signed)
 PT Cancellation Note  Patient Details Name: [name] MRN: [id number] DOB: 01/29/1955   Cancelled Treatment:    Reason Eval/Treat Not Completed: Fatigue/lethargy limiting ability to participate Patient sound asleep. Has ambulated with Mobility today.  Abelina Hoes PT Acute Rehabilitation Services Office [phone]    Dareen Ebbing 05/25/2023, 2:43 PM

## 2023-05-25 NOTE — Plan of Care (Signed)

## 2023-05-25 NOTE — Plan of Care (Signed)

## 2023-05-26 DIAGNOSIS — K7682 Hepatic encephalopathy: Secondary | ICD-10-CM | POA: Diagnosis not present

## 2023-05-26 LAB — COMPREHENSIVE METABOLIC PANEL WITH GFR
ALT: 22 U/L (ref 0–44)
AST: 32 U/L (ref 15–41)
Albumin: 2.5 g/dL — ABNORMAL LOW (ref 3.5–5.0)
Alkaline Phosphatase: 81 U/L (ref 38–126)
Anion gap: 4 — ABNORMAL LOW (ref 5–15)
BUN: 22 mg/dL (ref 8–23)
CO2: 22 mmol/L (ref 22–32)
Calcium: 7.9 mg/dL — ABNORMAL LOW (ref 8.9–10.3)
Chloride: 109 mmol/L (ref 98–111)
Creatinine, Ser: 1.2 mg/dL — ABNORMAL HIGH (ref 0.44–1.00)
GFR, Estimated: 50 mL/min — ABNORMAL LOW (ref >60)
Glucose, Bld: 131 mg/dL — ABNORMAL HIGH (ref 70–99)
Potassium: 3.3 mmol/L — ABNORMAL LOW (ref 3.5–5.1)
Sodium: 135 mmol/L (ref 135–145)
Total Bilirubin: 1.8 mg/dL — ABNORMAL HIGH (ref 0.0–1.2)
Total Protein: 5.4 g/dL — ABNORMAL LOW (ref 6.5–8.1)

## 2023-05-26 LAB — CBC WITH DIFFERENTIAL/PLATELET
Abs Immature Granulocytes: 0.01 10*3/uL (ref 0.00–0.07)
Basophils Absolute: 0 10*3/uL (ref 0.0–0.1)
Basophils Relative: 1 %
Eosinophils Absolute: 0.4 10*3/uL (ref 0.0–0.5)
Eosinophils Relative: 9 %
HCT: 29.9 % — ABNORMAL LOW (ref 36.0–46.0)
Hemoglobin: 8.7 g/dL — ABNORMAL LOW (ref 12.0–15.0)
Immature Granulocytes: 0 %
Lymphocytes Relative: 26 %
Lymphs Abs: 1.3 10*3/uL (ref 0.7–4.0)
MCH: 29.1 pg (ref 26.0–34.0)
MCHC: 29.1 g/dL — ABNORMAL LOW (ref 30.0–36.0)
MCV: 100 fL (ref 80.0–100.0)
Monocytes Absolute: 0.5 10*3/uL (ref 0.1–1.0)
Monocytes Relative: 10 %
Neutro Abs: 2.7 10*3/uL (ref 1.7–7.7)
Neutrophils Relative %: 54 %
Platelets: 110 10*3/uL — ABNORMAL LOW (ref 150–400)
RBC: 2.99 MIL/uL — ABNORMAL LOW (ref 3.87–5.11)
RDW: 17.3 % — ABNORMAL HIGH (ref 11.5–15.5)
WBC: 5 10*3/uL (ref 4.0–10.5)
nRBC: 0 % (ref 0.0–0.2)

## 2023-05-26 LAB — MAGNESIUM: Magnesium: 1.8 mg/dL (ref 1.7–2.4)

## 2023-05-26 MED ORDER — FUROSEMIDE 20 MG PO TABS
20.0000 mg | ORAL_TABLET | Freq: Two times a day (BID) | ORAL | Status: DC
  Administered 2023-05-26 – 2023-05-28 (×5): 20 mg via ORAL
  Filled 2023-05-26 (×5): qty 1

## 2023-05-26 MED ORDER — MECLIZINE HCL 25 MG PO TABS
25.0000 mg | ORAL_TABLET | Freq: Three times a day (TID) | ORAL | Status: DC | PRN
  Administered 2023-05-26 – 2023-05-28 (×2): 25 mg via ORAL
  Filled 2023-05-26 (×2): qty 1

## 2023-05-26 MED ORDER — POTASSIUM CHLORIDE CRYS ER 20 MEQ PO TBCR
40.0000 meq | EXTENDED_RELEASE_TABLET | Freq: Once | ORAL | Status: AC
  Administered 2023-05-26: 40 meq via ORAL
  Filled 2023-05-26: qty 2

## 2023-05-26 MED ORDER — ORAL CARE MOUTH RINSE: 15.0000 mL | OROMUCOSAL | Status: DC | PRN

## 2023-05-26 MED ORDER — SPIRONOLACTONE 25 MG PO TABS
50.0000 mg | ORAL_TABLET | Freq: Every day | ORAL | Status: DC
  Administered 2023-05-26 – 2023-05-28 (×3): 50 mg via ORAL
  Filled 2023-05-26 (×3): qty 2

## 2023-05-26 NOTE — Progress Notes (Signed)
 PROGRESS NOTE    [name]  NWG:[id number] DOB: [birth year] DOA: 05/22/2023 PCP: Amadeo June, MD   Brief Narrative:  68 y.o. female with history of liver cirrhosis secondary to [facility], hypothyroidism, hyperlipidemia, GERD, chronic anemia and thrombocytopenia, recurrent admissions for hepatic encephalopathy, coagulopathy, chronic abdominal pain, restless leg syndrome, essential hypertension, chronic hypotension, GERD, generalized anxiety disorder, abdominal wall hernia, short gut syndrome, chronic copper  deficiency and morbid obesity presented with confusion and she was admitted for hepatic encephalopathy and started on lactulose .  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  Mental status slightly improving but not back to baseline.  As per the son.  Continue rifaximin . -Lasix  resumed on 05/25/2023: Patient complains of dizziness today.  Decrease Lasix  to 20 mg twice a day.  Blood improving with midodrine .  Resume spironolactone  today. -  No signs of bleeding.  Monitor platelets intermittently  Acute kidney injury on chronic kidney disease stage II - Creatinine 1.20 today.  Treated with IV fluids and discontinued.  Diuretic plan as above.  Monitor  History of hypertension Chronic hypotension - Monitor blood pressure.  Continue midodrine .  Diverticulum as above.  Short gut syndrome Abdominal wall hernia Chronic abdominal pain GERD - Continue Bentyl  and Protonix .  Outpatient follow-up with GI  Restless leg syndrome - Continue Requip   Chronic hypomagnesemia - Continue supplementation  Hypothyroidism -Continue levothyroxine   Generalized anxiety disorder - Continue venlafaxine  XR  Anemia of chronic disease - From chronic illnesses.  Hemoglobin  stable.  Monitor intermittently  Physical deconditioning - PT eval  Chronic metabolic acidosis - Improved.  History of copper  deficiency - Continue supplementation  Obesity class III - Outpatient follow-up   DVT prophylaxis: SCDs Code Status: Full Family Communication: None at bedside Disposition Plan: Status is: Observation The patient will require care spanning > 2 midnights and should be moved to inpatient because: Of severity of illness    Consultants: None  Procedures: None  Antimicrobials: None   Subjective: Patient seen and examined at bedside.  Complains of intermittent dizziness.  Does not feel ready to go home today.  Also complains of itching.  Son at bedside states that patient is not back to her baseline mental status yet.  No fever, seizures or vomiting reported.  Objective: Vitals:   05/25/23 1159 05/25/23 1939 05/26/23 0500 05/26/23 0535  BP: (!) 113/57 138/63  (!) 146/65  Pulse: 70 65  73  Resp:  17  18  Temp:  97.9 F (36.6 C)  97.8 F (36.6 C)  TempSrc:    Oral  SpO2:  100%  100%  Weight:   102.5 kg   Height:        Intake/Output Summary (Last 24 hours) at 05/26/2023 0955 Last data filed at 05/26/2023 0600 Gross per 24 hour  Intake 1380 ml  Output 2750 ml  Net -1370 ml   Filed Weights   05/23/23 0313 05/26/23 0500  Weight: 104 kg 102.5 kg    Examination:  General: On room air.  No distress.  Chronically ill and deconditioned looking. ENT/neck: No thyromegaly.  JVD is not elevated  respiratory: Decreased breath sounds at bases bilaterally with some crackles; no wheezing  CVS: S1-S2 heard, rate controlled currently Abdominal: Soft, morbidly obese, nontender, slightly distended; no organomegaly, bowel sounds are heard Extremities: Mild lower extremity  edema; no cyanosis  CNS: Awake and alert.  Still slow to respond and a poor historian.  No focal neurologic deficit.  Moves extremities Lymph: No obvious lymphadenopathy Skin: No  obvious ecchymosis/lesions  psych: Looks mildly anxious.  Not agitated currently. musculoskeletal: No obvious joint swelling/deformity    Data Reviewed: I have personally reviewed following labs and imaging studies  CBC: Recent Labs  Lab 05/22/23 1958 05/23/23 0327 05/24/23 0338 05/25/23 0321 05/26/23 0320  WBC 7.2 5.5 4.5 4.2 5.0  NEUTROABS 4.2  --   --   --  2.7  HGB 10.1* 8.8* 8.2* 8.7* 8.7*  HCT 32.9* 28.8* 26.7* 29.8* 29.9*  MCV 95.4 96.3 96.4 99.0 100.0  PLT 162 127* 105* 114* 110*   Basic Metabolic Panel: Recent Labs  Lab 05/22/23 1958 05/23/23 0327 05/24/23 0338 05/25/23 0321 05/26/23 0320  NA 141 136 139 139 135  K 4.1 3.6 3.6 3.6 3.3*  CL 113* 110 113* 112* 109  CO2 21* 19* 20* 20* 22  GLUCOSE 117* 90 122* 97 131*  BUN 28* 29* 28* 24* 22  CREATININE 1.30* 1.15* 1.25* 1.01* 1.20*  CALCIUM  8.5* 8.0* 8.2* 8.3* 7.9*  MG 2.2  --   --   --  1.8   GFR: Estimated Creatinine Clearance: 52.5 mL/min (A) (by C-G formula based on SCr of 1.2 mg/dL (H)). Liver Function Tests: Recent Labs  Lab 05/22/23 1958 05/23/23 0327 05/24/23 0338 05/25/23 0321 05/26/23 0320  AST 37 31 31 31  32  ALT 26 23 21 22 22   ALKPHOS 90 75 80 80 81  BILITOT 2.2* 2.2* 1.8* 1.7* 1.8*  PROT 6.2* 5.3* 5.1* 5.2* 5.4*  ALBUMIN  2.9* 2.6* 2.3* 2.4* 2.5*   Recent Labs  Lab 05/22/23 1958  LIPASE 58*   Recent Labs  Lab 05/22/23 1958 05/23/23 0327  AMMONIA 129* 113*   Coagulation Profile: No results for input(s): "INR", "PROTIME" in the last 168 hours. Cardiac Enzymes: No results for input(s): "CKTOTAL", "CKMB", "CKMBINDEX", "TROPONINI" in the last 168 hours. BNP (last 3 results) No results for input(s): "PROBNP" in the last 8760 hours. HbA1C: No results for input(s): "HGBA1C" in the last 72 hours. CBG: No results for input(s): "GLUCAP" in the last 168 hours. Lipid Profile: No results for input(s): "CHOL", "HDL", "LDLCALC", "TRIG", "CHOLHDL", "LDLDIRECT" in the last 72  hours. Thyroid  Function Tests: No results for input(s): "TSH", "T4TOTAL", "FREET4", "T3FREE", "THYROIDAB" in the last 72 hours. Anemia Panel: No results for input(s): "VITAMINB12", "FOLATE", "FERRITIN", "TIBC", "IRON ", "RETICCTPCT" in the last 72 hours. Sepsis Labs: No results for input(s): "PROCALCITON", "LATICACIDVEN" in the last 168 hours.  No results found for this or any previous visit (from the past 240 hours).       Radiology Studies: No results found.      Scheduled Meds:  ferrous sulfate   325 mg Oral Q breakfast   And   ascorbic acid   250 mg Oral Q breakfast   atorvastatin   40 mg Oral Daily   copper   2 mg Oral BID   cyanocobalamin   2,000 mcg Oral Q M,W,F   folic acid   1 mg Oral Daily   furosemide   40 mg Oral BID   lactulose   30 g Oral TID   levothyroxine   175 mcg Oral Q0600   lidocaine   1 patch Transdermal Q24H   magnesium  gluconate  500 mg Oral BID   megestrol   40 mg Oral BID   midodrine   10 mg Oral TID WC   pantoprazole   40  mg Oral BID AC   potassium chloride   40 mEq Oral Once   rifaximin   550 mg Oral BID   rOPINIRole   0.25 mg Oral QHS   sodium chloride  flush  3 mL Intravenous Q12H   sodium chloride  flush  3 mL Intravenous Q12H   spironolactone   50 mg Oral Daily   venlafaxine  XR  75 mg Oral Q breakfast   Continuous Infusions:        Audria Leather, MD [facility] 05/26/2023, 9:55 AM

## 2023-05-26 NOTE — Progress Notes (Signed)
 PT Cancellation Note  Patient Details Name: [name] MRN: [id number] DOB: 04/02/1955   Cancelled Treatment:    Reason Eval/Treat Not Completed: Medical issues which prohibited therapy (pt declined mobility, she stated she's getting dizzy when she gets up. Will follow.)   Daymon Evans PT 05/26/2023  Acute Rehabilitation Services  Office [phone]

## 2023-05-27 ENCOUNTER — Observation Stay (HOSPITAL_COMMUNITY): Payer: Medicare (Managed Care)

## 2023-05-27 DIAGNOSIS — K7682 Hepatic encephalopathy: Secondary | ICD-10-CM | POA: Diagnosis not present

## 2023-05-27 LAB — URINALYSIS, ROUTINE W REFLEX MICROSCOPIC
Bilirubin Urine: NEGATIVE
Glucose, UA: NEGATIVE mg/dL
Hgb urine dipstick: NEGATIVE
Ketones, ur: NEGATIVE mg/dL
Leukocytes,Ua: NEGATIVE
Nitrite: NEGATIVE
Protein, ur: NEGATIVE mg/dL
Specific Gravity, Urine: 1.019 (ref 1.005–1.030)
pH: 5 (ref 5.0–8.0)

## 2023-05-27 LAB — COMPREHENSIVE METABOLIC PANEL WITH GFR
ALT: 22 U/L (ref 0–44)
AST: 32 U/L (ref 15–41)
Albumin: 2.4 g/dL — ABNORMAL LOW (ref 3.5–5.0)
Alkaline Phosphatase: 77 U/L (ref 38–126)
Anion gap: 6 (ref 5–15)
BUN: 20 mg/dL (ref 8–23)
CO2: 20 mmol/L — ABNORMAL LOW (ref 22–32)
Calcium: 8 mg/dL — ABNORMAL LOW (ref 8.9–10.3)
Chloride: 113 mmol/L — ABNORMAL HIGH (ref 98–111)
Creatinine, Ser: 1.1 mg/dL — ABNORMAL HIGH (ref 0.44–1.00)
GFR, Estimated: 56 mL/min — ABNORMAL LOW (ref >60)
Glucose, Bld: 110 mg/dL — ABNORMAL HIGH (ref 70–99)
Potassium: 3.7 mmol/L (ref 3.5–5.1)
Sodium: 139 mmol/L (ref 135–145)
Total Bilirubin: 1.7 mg/dL — ABNORMAL HIGH (ref 0.0–1.2)
Total Protein: 4.9 g/dL — ABNORMAL LOW (ref 6.5–8.1)

## 2023-05-27 LAB — CBC WITH DIFFERENTIAL/PLATELET
Abs Immature Granulocytes: 0.01 10*3/uL (ref 0.00–0.07)
Basophils Absolute: 0 10*3/uL (ref 0.0–0.1)
Basophils Relative: 1 %
Eosinophils Absolute: 0.4 10*3/uL (ref 0.0–0.5)
Eosinophils Relative: 9 %
HCT: 28.2 % — ABNORMAL LOW (ref 36.0–46.0)
Hemoglobin: 8.4 g/dL — ABNORMAL LOW (ref 12.0–15.0)
Immature Granulocytes: 0 %
Lymphocytes Relative: 30 %
Lymphs Abs: 1.2 10*3/uL (ref 0.7–4.0)
MCH: 29.4 pg (ref 26.0–34.0)
MCHC: 29.8 g/dL — ABNORMAL LOW (ref 30.0–36.0)
MCV: 98.6 fL (ref 80.0–100.0)
Monocytes Absolute: 0.4 10*3/uL (ref 0.1–1.0)
Monocytes Relative: 10 %
Neutro Abs: 2 10*3/uL (ref 1.7–7.7)
Neutrophils Relative %: 50 %
Platelets: 108 10*3/uL — ABNORMAL LOW (ref 150–400)
RBC: 2.86 MIL/uL — ABNORMAL LOW (ref 3.87–5.11)
RDW: 17.2 % — ABNORMAL HIGH (ref 11.5–15.5)
WBC: 4.1 10*3/uL (ref 4.0–10.5)
nRBC: 0 % (ref 0.0–0.2)

## 2023-05-27 LAB — MAGNESIUM: Magnesium: 1.8 mg/dL (ref 1.7–2.4)

## 2023-05-27 MED ORDER — LORATADINE 10 MG PO TABS
10.0000 mg | ORAL_TABLET | Freq: Once | ORAL | Status: AC
  Administered 2023-05-27: 10 mg via ORAL
  Filled 2023-05-27: qty 1

## 2023-05-27 MED ORDER — LORATADINE 10 MG PO TABS
10.0000 mg | ORAL_TABLET | Freq: Every day | ORAL | Status: DC
  Administered 2023-05-27 – 2023-05-29 (×3): 10 mg via ORAL
  Filled 2023-05-27 (×3): qty 1

## 2023-05-27 MED ORDER — ALUM & MAG HYDROXIDE-SIMETH 200-200-20 MG/5ML PO SUSP: 15.0000 mL | ORAL | Status: DC | PRN

## 2023-05-27 MED ORDER — SODIUM CHLORIDE 0.9 % IV SOLN
2.0000 g | INTRAVENOUS | Status: DC
  Administered 2023-05-27 – 2023-05-28 (×2): 2 g via INTRAVENOUS
  Filled 2023-05-27 (×2): qty 20

## 2023-05-27 NOTE — Plan of Care (Signed)
  Problem: Coping: Goal: Level of anxiety will decrease Outcome: Progressing   Problem: Pain Managment: Goal: General experience of comfort will improve and/or be controlled Outcome: Progressing   Problem: Elimination: Goal: Will not experience complications related to bowel motility Outcome: Progressing   Problem: Safety: Goal: Ability to remain free from injury will improve Outcome: Progressing

## 2023-05-27 NOTE — Progress Notes (Signed)
 PROGRESS NOTE    Kataryna Mcquilkin  WUJ:[id number] DOB: [birth year] DOA: 05/22/2023 PCP: Amadeo June, MD   Brief Narrative:  68 y.o. female with history of liver cirrhosis secondary to [facility], hypothyroidism, hyperlipidemia, GERD, chronic anemia and thrombocytopenia, recurrent admissions for hepatic encephalopathy, coagulopathy, chronic abdominal pain, restless leg syndrome, essential hypertension, chronic hypotension, GERD, generalized anxiety disorder, abdominal wall hernia, short gut syndrome, chronic copper  deficiency and morbid obesity presented with confusion and she was admitted for hepatic encephalopathy and started on lactulose .  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  Mental status slightly improving but not back to baseline as per the son.  Continue rifaximin . -Lasix  resumed on 05/25/2023:  Decreased Lasix  to 20 mg twice a day on 05/26/2023 because of dizziness.  Blood pressure improving with midodrine .  Resumed spironolactone  on 05/26/2023 -  No signs of bleeding.  Monitor platelets intermittently  Acute kidney injury on chronic kidney disease stage II - Creatinine 1.10 today.  Treated with IV fluids and discontinued.  Diuretic plan as above.  Monitor  History of hypertension Chronic hypotension - Monitor blood pressure.  Continue midodrine .  Diuretic plan as above.  Short gut syndrome Abdominal wall hernia Chronic abdominal pain GERD - Continue Bentyl  and Protonix .  Outpatient follow-up with GI  Dysuria - Patient complains of dysuria and right flank pain today.  Check UA and urine culture.  Restless leg syndrome - Continue Requip   Chronic hypomagnesemia - Continue supplementation  Hypothyroidism -Continue levothyroxine   Generalized  anxiety disorder - Continue venlafaxine  XR  Anemia of chronic disease - From chronic illnesses.  Hemoglobin stable.  Monitor intermittently  Physical deconditioning - PT eval pending  Chronic metabolic acidosis - Mild.  Monitor.  History of copper  deficiency - Continue supplementation  Obesity class III - Outpatient follow-up   DVT prophylaxis: SCDs Code Status: Full Family Communication: None at bedside Disposition Plan: Status is: Observation The patient will require care spanning > 2 midnights and should be moved to inpatient because: Of severity of illness    Consultants: None  Procedures: None  Antimicrobials: None   Subjective: Patient seen and examined at bedside.  Does not feel well.  Complains of right flank pain and foul-smelling urine with burning urination.  Dizziness is improving.  Son at bedside.  No fever, seizures or vomiting reported. Objective: Vitals:   05/26/23 1340 05/26/23 2055 05/27/23 0500 05/27/23 0537  BP: 117/67 114/75  (!) 135/56  Pulse: 73 69  71  Resp: 18 17  14   Temp: 98.2 F (36.8 C) 98.2 F (36.8 C)  97.7 F (36.5 C)  TempSrc:  Oral  Oral  SpO2: 98% 98%  99%  Weight:   105 kg   Height:        Intake/Output Summary (Last 24 hours) at 05/27/2023 1036 Last data filed at 05/27/2023 0200 Gross per 24 hour  Intake 360 ml  Output 1100 ml  Net -740 ml   Filed Weights   05/23/23 0313 05/26/23 0500 05/27/23 0500  Weight: 104 kg 102.5 kg 105 kg    Examination:  General: No acute distress.  Remains on room air.  Chronically ill and deconditioned looking. ENT/neck: No JVD elevation or palpable neck masses noted respiratory: Bilateral decreased breath sounds at bases with scattered crackles  CVS: S1-S2 are heard, rate  mostly controlled Abdominal: Soft, morbidly obese, mildly tender, and mildly; no organomegaly, normal bowel sounds heard  extremities: No clubbing; trace lower extremity edema present  CNS: Alert and slow to  respond and a poor historian.  No obvious focal deficits  lymph: No obvious palpable lymphadenopathy Skin: No obvious petechiae/rashes  psych: Mildly anxious.  Currently not agitated.   Musculoskeletal: No obvious joint tenderness/erythema    Data Reviewed: I have personally reviewed following labs and imaging studies  CBC: Recent Labs  Lab 05/22/23 1958 05/23/23 0327 05/24/23 0338 05/25/23 0321 05/26/23 0320 05/27/23 0415  WBC 7.2 5.5 4.5 4.2 5.0 4.1  NEUTROABS 4.2  --   --   --  2.7 2.0  HGB 10.1* 8.8* 8.2* 8.7* 8.7* 8.4*  HCT 32.9* 28.8* 26.7* 29.8* 29.9* 28.2*  MCV 95.4 96.3 96.4 99.0 100.0 98.6  PLT 162 127* 105* 114* 110* 108*   Basic Metabolic Panel: Recent Labs  Lab 05/22/23 1958 05/23/23 0327 05/24/23 0338 05/25/23 0321 05/26/23 0320 05/27/23 0415  NA 141 136 139 139 135 139  K 4.1 3.6 3.6 3.6 3.3* 3.7  CL 113* 110 113* 112* 109 113*  CO2 21* 19* 20* 20* 22 20*  GLUCOSE 117* 90 122* 97 131* 110*  BUN 28* 29* 28* 24* 22 20  CREATININE 1.30* 1.15* 1.25* 1.01* 1.20* 1.10*  CALCIUM  8.5* 8.0* 8.2* 8.3* 7.9* 8.0*  MG 2.2  --   --   --  1.8 1.8   GFR: Estimated Creatinine Clearance: 58 mL/min (A) (by C-G formula based on SCr of 1.1 mg/dL (H)). Liver Function Tests: Recent Labs  Lab 05/23/23 0327 05/24/23 0338 05/25/23 0321 05/26/23 0320 05/27/23 0415  AST 31 31 31  32 32  ALT 23 21 22 22 22   ALKPHOS 75 80 80 81 77  BILITOT 2.2* 1.8* 1.7* 1.8* 1.7*  PROT 5.3* 5.1* 5.2* 5.4* 4.9*  ALBUMIN  2.6* 2.3* 2.4* 2.5* 2.4*   Recent Labs  Lab 05/22/23 1958  LIPASE 58*   Recent Labs  Lab 05/22/23 1958 05/23/23 0327  AMMONIA 129* 113*   Coagulation Profile: No results for input(s): "INR", "PROTIME" in the last 168 hours. Cardiac Enzymes: No results for input(s): "CKTOTAL", "CKMB", "CKMBINDEX", "TROPONINI" in the last 168 hours. BNP (last 3 results) No results for input(s): "PROBNP" in the last 8760 hours. HbA1C: No results for input(s): "HGBA1C" in  the last 72 hours. CBG: No results for input(s): "GLUCAP" in the last 168 hours. Lipid Profile: No results for input(s): "CHOL", "HDL", "LDLCALC", "TRIG", "CHOLHDL", "LDLDIRECT" in the last 72 hours. Thyroid  Function Tests: No results for input(s): "TSH", "T4TOTAL", "FREET4", "T3FREE", "THYROIDAB" in the last 72 hours. Anemia Panel: No results for input(s): "VITAMINB12", "FOLATE", "FERRITIN", "TIBC", "IRON ", "RETICCTPCT" in the last 72 hours. Sepsis Labs: No results for input(s): "PROCALCITON", "LATICACIDVEN" in the last 168 hours.  No results found for this or any previous visit (from the past 240 hours).       Radiology Studies: No results found.      Scheduled Meds:  ferrous sulfate   325 mg Oral Q breakfast   And   ascorbic acid   250 mg Oral Q breakfast   atorvastatin   40 mg Oral Daily   copper   2 mg Oral BID   cyanocobalamin   2,000 mcg Oral Q M,W,F   folic acid   1 mg Oral Daily   furosemide   20 mg Oral BID   lactulose   30 g Oral TID   levothyroxine   175 mcg Oral Q0600  lidocaine   1 patch Transdermal Q24H   magnesium  gluconate  500 mg Oral BID   megestrol   40 mg Oral BID   midodrine   10 mg Oral TID WC   pantoprazole   40 mg Oral BID AC   rifaximin   550 mg Oral BID   rOPINIRole   0.25 mg Oral QHS   sodium chloride  flush  3 mL Intravenous Q12H   sodium chloride  flush  3 mL Intravenous Q12H   spironolactone   50 mg Oral Daily   venlafaxine  XR  75 mg Oral Q breakfast   Continuous Infusions:        Audria Leather, MD [facility] 05/27/2023, 10:36 AM

## 2023-05-27 NOTE — Plan of Care (Signed)
   Problem: Coping: Goal: Level of anxiety will decrease Outcome: Progressing   Problem: Pain Managment: Goal: General experience of comfort will improve and/or be controlled Outcome: Progressing   Problem: Safety: Goal: Ability to remain free from injury will improve Outcome: Progressing

## 2023-05-27 NOTE — Progress Notes (Signed)
 PT Cancellation Note  Patient Details Name: [name] MRN: [id number] DOB: [birth year]   Cancelled Treatment:    Reason Eval/Treat Not Completed: Medical issues which prohibited therapy (pt reports dizziness when she's up standing, she declined mobility. Will follow.)   Daymon Evans PT 05/27/2023  Acute Rehabilitation Services  Office [phone]

## 2023-05-27 NOTE — Evaluation (Addendum)
 Physical Therapy Evaluation Patient Details Name: [name] MRN: [id number] DOB: September 13, 1955 Today's Date: 05/27/2023  History of Present Illness  68 y.o. female presented with confusion and she was admitted for hepatic encephalopathy and started on lactulose . Pt with history of liver cirrhosis secondary to [facility], hypothyroidism, hyperlipidemia, GERD, chronic anemia and thrombocytopenia, recurrent admissions for hepatic encephalopathy, coagulopathy, chronic abdominal pain, restless leg syndrome, essential hypertension, chronic hypotension, GERD, generalized anxiety disorder, abdominal wall hernia, short gut syndrome, chronic copper  deficiency and morbid obesity.  Clinical Impression  Pt admitted with above diagnosis. Pt reports dizziness in sitting and standing. BP sitting 139/67, not able to tolerate standing long enough to obtain BP. Pt did transfer bed to bedside commode with RW but was too dizzy to attempt ambulation today. Earlier this week she was able to walk in the hallway with the mobility specialist, this is a decline in activity tolerance. Pt also reports new R groin area pain.  Pt currently with functional limitations due to the deficits listed below (see PT Problem List). Pt will benefit from acute skilled PT to increase their independence and safety with mobility to allow discharge.           If plan is discharge home, recommend the following: A little help with walking and/or transfers;A little help with bathing/dressing/bathroom;Assistance with cooking/housework;Assist for transportation;Help with stairs or ramp for entrance   Can travel by private vehicle        Equipment Recommendations None recommended by PT  Recommendations for Other Services       Functional Status Assessment Patient has had a recent decline in their functional status and demonstrates the ability to make significant improvements in function in a reasonable and predictable amount of time.      Precautions / Restrictions Precautions Precautions: Fall Recall of Precautions/Restrictions: Intact Restrictions Weight Bearing Restrictions Per Provider Order: No      Mobility  Bed Mobility Overal bed mobility: Needs Assistance Bed Mobility: Supine to Sit, Sit to Supine     Supine to sit: Mod assist, HOB elevated, Used rails Sit to supine: Supervision   General bed mobility comments: assist to raise trunk    Transfers Overall transfer level: Needs assistance Equipment used: Rolling walker (2 wheels) Transfers: Sit to/from Stand, Bed to chair/wheelchair/BSC Sit to Stand: Contact guard assist   Step pivot transfers: Contact guard assist       General transfer comment: VCs for hand placement, pt took several pivotal steps to recliner, pt stated she felt like she was "floating" in standing, also reported dizziness sitting.    Ambulation/Gait Ambulation/Gait assistance: Contact guard assist Gait Distance (Feet): 3 Feet Assistive device: Rolling walker (2 wheels) Gait Pattern/deviations: Step-to pattern, Decreased step length - right, Decreased step length - left Gait velocity: decr     General Gait Details: 3 side steps at edge of bed, limited by dizziness  Stairs            Wheelchair Mobility     Tilt Bed    Modified Rankin (Stroke Patients Only)       Balance Overall balance assessment: Needs assistance Sitting-balance support: Feet supported, No upper extremity supported Sitting balance-Leahy Scale: Fair     Standing balance support: Bilateral upper extremity supported, During functional activity, Reliant on assistive device for balance Standing balance-Leahy Scale: Poor  Pertinent Vitals/Pain Pain Assessment Pain Assessment: 0-10 Pain Score: 8  Pain Location: R groin Pain Descriptors / Indicators: Sharp, Grimacing Pain Intervention(s): Limited activity within patient's tolerance, Monitored during  session, Premedicated before session, Heat applied, Repositioned    Home Living Family/patient expects to be discharged to:: Private residence Living Arrangements: Children Available Help at Discharge: Family;Available 24 hours/day Type of Home: House Home Access: Ramped entrance       Home Layout: One level Home Equipment: Rollator (4 wheels);Rolling Walker (2 wheels);BSC/3in1;Other (comment);Hospital bed;Wheelchair - manual Additional Comments: Pt lives with son [name] - very supportive, provides assist as needed    Prior Function Prior Level of Function : Needs assist             Mobility Comments: son typically present for all mobility; uses RW in home, manual WC outside home ADLs Comments: Son has been assisting with ADLs lately. Pt states she was doing her own ADL tasks until recently     Extremity/Trunk Assessment   Upper Extremity Assessment Upper Extremity Assessment: Overall WFL for tasks assessed    Lower Extremity Assessment Lower Extremity Assessment: Overall WFL for tasks assessed    Cervical / Trunk Assessment Cervical / Trunk Assessment: Normal  Communication   Communication Communication: No apparent difficulties    Cognition Arousal: Alert Behavior During Therapy: WFL for tasks assessed/performed   PT - Cognitive impairments: No apparent impairments                         Following commands: Intact       Cueing       General Comments General comments (skin integrity, edema, etc.): BP sitting 139/67, pt dizzy in standing and moreso in standing, not able to tolerate standing long enought to obtain BP    Exercises     Assessment/Plan    PT Assessment Patient needs continued PT services  PT Problem List Decreased activity tolerance;Decreased balance;Pain;Decreased mobility       PT Treatment Interventions Gait training;Therapeutic activities;Therapeutic exercise;Functional mobility training;Patient/family education    PT  Goals (Current goals can be found in the Care Plan section)  Acute Rehab PT Goals Patient Stated Goal: to be able to walk, go on road trips with son PT Goal Formulation: With patient/family Time For Goal Achievement: 06/10/23 Potential to Achieve Goals: Fair    Frequency Min 3X/week     Co-evaluation               AM-PAC PT "6 Clicks" Mobility  Outcome Measure Help needed turning from your back to your side while in a flat bed without using bedrails?: A Little Help needed moving from lying on your back to sitting on the side of a flat bed without using bedrails?: A Lot Help needed moving to and from a bed to a chair (including a wheelchair)?: A Little Help needed standing up from a chair using your arms (e.g., wheelchair or bedside chair)?: A Little Help needed to walk in hospital room?: A Lot Help needed climbing 3-5 steps with a railing? : A Lot 6 Click Score: 15    End of Session Equipment Utilized During Treatment: Gait belt Activity Tolerance: Patient limited by pain;Treatment limited secondary to medical complications (Comment) (dizziness) Patient left: in bed;with call bell/phone within reach;with family/visitor present Nurse Communication: Mobility status PT Visit Diagnosis: Difficulty in walking, not elsewhere classified (R26.2);Pain Pain - Right/Left: Right Pain - part of body: Hip    Time: 1125-1140 PT  Time Calculation (min) (ACUTE ONLY): 15 min   Charges:   PT Evaluation $PT Eval Moderate Complexity: 1 Mod   PT General Charges $$ ACUTE PT VISIT: 1 Visit         Daymon Evans PT 05/27/2023  Acute Rehabilitation Services  Office [phone]

## 2023-05-28 DIAGNOSIS — K7682 Hepatic encephalopathy: Secondary | ICD-10-CM | POA: Diagnosis not present

## 2023-05-28 LAB — CBC WITH DIFFERENTIAL/PLATELET
Abs Immature Granulocytes: 0.01 K/uL (ref 0.00–0.07)
Basophils Absolute: 0 K/uL (ref 0.0–0.1)
Basophils Relative: 1 %
Eosinophils Absolute: 0.4 K/uL (ref 0.0–0.5)
Eosinophils Relative: 10 %
HCT: 28 % — ABNORMAL LOW (ref 36.0–46.0)
Hemoglobin: 8.3 g/dL — ABNORMAL LOW (ref 12.0–15.0)
Immature Granulocytes: 0 %
Lymphocytes Relative: 29 %
Lymphs Abs: 1.2 K/uL (ref 0.7–4.0)
MCH: 29.6 pg (ref 26.0–34.0)
MCHC: 29.6 g/dL — ABNORMAL LOW (ref 30.0–36.0)
MCV: 100 fL (ref 80.0–100.0)
Monocytes Absolute: 0.4 K/uL (ref 0.1–1.0)
Monocytes Relative: 10 %
Neutro Abs: 2.1 K/uL (ref 1.7–7.7)
Neutrophils Relative %: 50 %
Platelets: 101 K/uL — ABNORMAL LOW (ref 150–400)
RBC: 2.8 MIL/uL — ABNORMAL LOW (ref 3.87–5.11)
RDW: 17.2 % — ABNORMAL HIGH (ref 11.5–15.5)
WBC: 4.3 K/uL (ref 4.0–10.5)
nRBC: 0 % (ref 0.0–0.2)

## 2023-05-28 LAB — COMPREHENSIVE METABOLIC PANEL WITH GFR
ALT: 20 U/L (ref 0–44)
AST: 29 U/L (ref 15–41)
Albumin: 2.3 g/dL — ABNORMAL LOW (ref 3.5–5.0)
Alkaline Phosphatase: 72 U/L (ref 38–126)
Anion gap: 7 (ref 5–15)
BUN: 21 mg/dL (ref 8–23)
CO2: 21 mmol/L — ABNORMAL LOW (ref 22–32)
Calcium: 8 mg/dL — ABNORMAL LOW (ref 8.9–10.3)
Chloride: 113 mmol/L — ABNORMAL HIGH (ref 98–111)
Creatinine, Ser: 1.12 mg/dL — ABNORMAL HIGH (ref 0.44–1.00)
GFR, Estimated: 55 mL/min — ABNORMAL LOW
Glucose, Bld: 117 mg/dL — ABNORMAL HIGH (ref 70–99)
Potassium: 3.8 mmol/L (ref 3.5–5.1)
Sodium: 141 mmol/L (ref 135–145)
Total Bilirubin: 1.2 mg/dL (ref 0.0–1.2)
Total Protein: 4.9 g/dL — ABNORMAL LOW (ref 6.5–8.1)

## 2023-05-28 LAB — MAGNESIUM: Magnesium: 1.9 mg/dL (ref 1.7–2.4)

## 2023-05-28 LAB — URINE CULTURE

## 2023-05-28 LAB — AMMONIA: Ammonia: 117 umol/L — ABNORMAL HIGH (ref 9–35)

## 2023-05-28 MED ORDER — DICYCLOMINE HCL 10 MG PO CAPS
20.0000 mg | ORAL_CAPSULE | Freq: Three times a day (TID) | ORAL | Status: DC | PRN
  Administered 2023-05-28 (×2): 20 mg via ORAL
  Filled 2023-05-28 (×2): qty 2

## 2023-05-28 MED ORDER — HYDROCODONE-ACETAMINOPHEN 5-325 MG PO TABS
1.0000 | ORAL_TABLET | Freq: Two times a day (BID) | ORAL | Status: DC | PRN
  Administered 2023-05-28: 1 via ORAL
  Filled 2023-05-28: qty 1

## 2023-05-28 MED ORDER — SENNOSIDES-DOCUSATE SODIUM 8.6-50 MG PO TABS
1.0000 | ORAL_TABLET | Freq: Two times a day (BID) | ORAL | Status: DC
Start: 2023-05-28 — End: 2023-05-29
  Administered 2023-05-28 – 2023-05-29 (×3): 1 via ORAL
  Filled 2023-05-28 (×3): qty 1

## 2023-05-28 MED ORDER — LACTULOSE 10 GM/15ML PO SOLN
30.0000 g | Freq: Four times a day (QID) | ORAL | Status: DC
  Administered 2023-05-28 – 2023-05-29 (×4): 30 g via ORAL
  Filled 2023-05-28 (×4): qty 45

## 2023-05-28 MED ORDER — POLYETHYLENE GLYCOL 3350 17 G PO PACK
17.0000 g | PACK | Freq: Every day | ORAL | Status: DC | PRN
Start: 2023-05-28 — End: 2023-05-29
  Administered 2023-05-28: 17 g via ORAL
  Filled 2023-05-28: qty 1

## 2023-05-28 NOTE — Progress Notes (Signed)
 Mobility Specialist - Progress Note   05/28/23 1351  Mobility  Activity Transferred to/from [facility];Ambulated with assistance in hallway  Level of Assistance Contact guard assist, steadying assist  Assistive Device Front wheel walker;BSC  Distance Ambulated (ft) 90 ft  Range of Motion/Exercises Active  Activity Response Tolerated well  Mobility Referral Yes  Mobility visit 1 Mobility  Mobility Specialist Start Time (ACUTE ONLY) 1330  Mobility Specialist Stop Time (ACUTE ONLY) 1351  Mobility Specialist Time Calculation (min) (ACUTE ONLY) 21 min   Pt was found in bed and agreeable to ambulate after bathroom use. Grew fatigued with session and stated having BLE weakness. At EOS returned to bed with all needs met. Call bell in reach.  Lorna Rose,  Mobility Specialist Can be reached via Secure Chat

## 2023-05-28 NOTE — Progress Notes (Signed)
 PROGRESS NOTE    [name]  ZOX:[id number] DOB: 11-14-1955 DOA: 05/22/2023 PCP: Amadeo June, MD   Brief Narrative:  68 y.o. female with history of liver cirrhosis secondary to [facility], hypothyroidism, hyperlipidemia, GERD, chronic anemia and thrombocytopenia, recurrent admissions for hepatic encephalopathy, coagulopathy, chronic abdominal pain, restless leg syndrome, essential hypertension, chronic hypotension, GERD, generalized anxiety disorder, abdominal wall hernia, short gut syndrome, chronic copper  deficiency and morbid obesity presented with confusion and she was admitted for hepatic encephalopathy and started on lactulose .  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  Mental status slightly improving but not back to baseline as per the son.  Ammonia 117 today.  Continue rifaximin .  Recommended to to avoid narcotics. - Continues to have dizziness.  Will DC Lasix  and spironolactone  today.  Continue midodrine : Blood pressure intermittently still on the lower side.   - No signs of bleeding.  Monitor platelets intermittently  Acute kidney injury on chronic kidney disease stage II - Creatinine 1.12 today.  Treated with IV fluids and discontinued.  Diuretic plan as above.  Monitor  History of hypertension Chronic hypotension - Monitor blood pressure.  Continue midodrine .  Diuretic plan as above.  Short gut syndrome Abdominal wall hernia Chronic abdominal pain GERD - Continue Bentyl  and Protonix .  Outpatient follow-up with GI  Possible UTI with dysuria and right flank pain - Follow urine culture.  Continue Rocephin , started on 05/27/2023.  Restless leg syndrome - Continue Requip   Chronic hypomagnesemia - Continue  supplementation  Hypothyroidism -Continue levothyroxine   Generalized anxiety disorder - Continue venlafaxine  XR  Anemia of chronic disease - From chronic illnesses.  Hemoglobin stable.  Monitor intermittently  Physical deconditioning - PT recommending home health PT.  Chronic metabolic acidosis - Mild.  Monitor.  History of copper  deficiency - Continue supplementation  Obesity class III - Outpatient follow-up   DVT prophylaxis: SCDs Code Status: Full Family Communication: None at bedside Disposition Plan: Status is: Observation The patient will require care spanning > 2 midnights and should be moved to inpatient because: Of severity of illness    Consultants: None  Procedures: None  Antimicrobials: None   Subjective: Patient seen and examined at bedside.  Does not feel well.  Still complains of dizziness and right flank pain.  Not having that many bowel movements as per the son at bedside.  No fever or worsening shortness of breath reported.  Objective: Vitals:   05/27/23 2217 05/27/23 2242 05/28/23 0556 05/28/23 0721  BP: (!) 115/57 (!) 115/56 (!) 106/44   Pulse: 73 67 71   Resp: 16 18 15    Temp: (!) 97.4 F (36.3 C) 97.7 F (36.5 C) 98.4 F (36.9 C)   TempSrc:  Oral    SpO2: 98% 99% 99%   Weight:    105 kg  Height:        Intake/Output Summary (Last 24 hours) at 05/28/2023 1024 Last data filed at 05/28/2023 0530 Gross per 24 hour  Intake 279.32 ml  Output 350 ml  Net -70.68 ml   Filed Weights   05/26/23 0500 05/27/23 0500 05/28/23 0721  Weight: 102.5 kg 105 kg 105 kg    Examination:  General: On room air.  No distress.  Chronically ill and deconditioned looking. ENT/neck: No obvious thyromegaly or elevated JVD noted respiratory: Decreased breath  sounds at bases bilaterally with some crackles CVS: Currently rate controlled; S1-S2 are heard  abdominal: Soft, morbidly obese, slightly tender and distended; no organomegaly, bowel normally heard   extremities: Mild lower extremity edema present; no cyanosis  CNS: Awake; still slow to respond.  Poor historian.  No focal deficits noted lymph: No obvious lymphadenopathy palpable  skin: No obvious lesions/ecchymosis  psych: Send no signs of agitation.  Still anxious.   Musculoskeletal: No obvious joint swelling/deformity    Data Reviewed: I have personally reviewed following labs and imaging studies  CBC: Recent Labs  Lab 05/22/23 1958 05/23/23 0327 05/24/23 0338 05/25/23 0321 05/26/23 0320 05/27/23 0415 05/28/23 0356  WBC 7.2   < > 4.5 4.2 5.0 4.1 4.3  NEUTROABS 4.2  --   --   --  2.7 2.0 2.1  HGB 10.1*   < > 8.2* 8.7* 8.7* 8.4* 8.3*  HCT 32.9*   < > 26.7* 29.8* 29.9* 28.2* 28.0*  MCV 95.4   < > 96.4 99.0 100.0 98.6 100.0  PLT 162   < > 105* 114* 110* 108* 101*   < > = values in this interval not displayed.   Basic Metabolic Panel: Recent Labs  Lab 05/22/23 1958 05/23/23 0327 05/24/23 8657 05/25/23 0321 05/26/23 0320 05/27/23 0415 05/28/23 0356  NA 141   < > 139 139 135 139 141  K 4.1   < > 3.6 3.6 3.3* 3.7 3.8  CL 113*   < > 113* 112* 109 113* 113*  CO2 21*   < > 20* 20* 22 20* 21*  GLUCOSE 117*   < > 122* 97 131* 110* 117*  BUN 28*   < > 28* 24* 22 20 21   CREATININE 1.30*   < > 1.25* 1.01* 1.20* 1.10* 1.12*  CALCIUM  8.5*   < > 8.2* 8.3* 7.9* 8.0* 8.0*  MG 2.2  --   --   --  1.8 1.8 1.9   < > = values in this interval not displayed.   GFR: Estimated Creatinine Clearance: 57 mL/min (A) (by C-G formula based on SCr of 1.12 mg/dL (H)). Liver Function Tests: Recent Labs  Lab 05/24/23 0338 05/25/23 0321 05/26/23 0320 05/27/23 0415 05/28/23 0356  AST 31 31 32 32 29  ALT 21 22 22 22 20   ALKPHOS 80 80 81 77 72  BILITOT 1.8* 1.7* 1.8* 1.7* 1.2  PROT 5.1* 5.2* 5.4* 4.9* 4.9*  ALBUMIN  2.3* 2.4* 2.5* 2.4* 2.3*   Recent Labs  Lab 05/22/23 1958  LIPASE 58*   Recent Labs  Lab 05/22/23 1958 05/23/23 0327 05/28/23 0356  AMMONIA 129* 113* 117*    Coagulation Profile: No results for input(s): "INR", "PROTIME" in the last 168 hours. Cardiac Enzymes: No results for input(s): "CKTOTAL", "CKMB", "CKMBINDEX", "TROPONINI" in the last 168 hours. BNP (last 3 results) No results for input(s): "PROBNP" in the last 8760 hours. HbA1C: No results for input(s): "HGBA1C" in the last 72 hours. CBG: No results for input(s): "GLUCAP" in the last 168 hours. Lipid Profile: No results for input(s): "CHOL", "HDL", "LDLCALC", "TRIG", "CHOLHDL", "LDLDIRECT" in the last 72 hours. Thyroid  Function Tests: No results for input(s): "TSH", "T4TOTAL", "FREET4", "T3FREE", "THYROIDAB" in the last 72 hours. Anemia Panel: No results for input(s): "VITAMINB12", "FOLATE", "FERRITIN", "TIBC", "IRON ", "RETICCTPCT" in the last 72 hours. Sepsis Labs: No results for input(s): "PROCALCITON", "LATICACIDVEN" in the last 168 hours.  No results found for this or any previous visit (from the past 240 hours).  Radiology Studies: DG Abd 1 View Result Date: 05/27/2023 CLINICAL DATA:  Abdominal pain. EXAM: ABDOMEN - 1 VIEW COMPARISON:  CT 05/03/2023 FINDINGS: Uppermost abdomen and lower most pelvis are not included in the field of view. No bowel dilatation to suggest obstruction. Moderate stool within the transverse and descending colon. Enteric sutures in the right abdomen. Calcified uterine fibroids in the pelvis. Cholecystectomy clips in the right upper quadrant. Abdominal vascular calcifications are seen. IMPRESSION: Nonobstructive bowel gas pattern. Moderate stool in the transverse and descending colon. Electronically Signed   By: Chadwick Colonel M.D.   On: 05/27/2023 12:33        Scheduled Meds:  ferrous sulfate   325 mg Oral Q breakfast   And   ascorbic acid   250 mg Oral Q breakfast   atorvastatin   40 mg Oral Daily   copper   2 mg Oral BID   cyanocobalamin   2,000 mcg Oral Q M,W,F   folic acid   1 mg Oral Daily   furosemide   20 mg Oral BID   lactulose   30  g Oral TID   levothyroxine   175 mcg Oral Q0600   lidocaine   1 patch Transdermal Q24H   loratadine   10 mg Oral Daily   magnesium  gluconate  500 mg Oral BID   megestrol   40 mg Oral BID   midodrine   10 mg Oral TID WC   pantoprazole   40 mg Oral BID AC   rifaximin   550 mg Oral BID   rOPINIRole   0.25 mg Oral QHS   sodium chloride  flush  3 mL Intravenous Q12H   sodium chloride  flush  3 mL Intravenous Q12H   spironolactone   50 mg Oral Daily   venlafaxine  XR  75 mg Oral Q breakfast   Continuous Infusions:  cefTRIAXone  (ROCEPHIN )  IV Stopped (05/27/23 1412)          Audria Leather, MD [facility] 05/28/2023, 10:24 AM

## 2023-05-29 DIAGNOSIS — K7682 Hepatic encephalopathy: Secondary | ICD-10-CM | POA: Diagnosis not present

## 2023-05-29 LAB — COMPREHENSIVE METABOLIC PANEL WITH GFR
ALT: 21 U/L (ref 0–44)
AST: 27 U/L (ref 15–41)
Albumin: 2.3 g/dL — ABNORMAL LOW (ref 3.5–5.0)
Alkaline Phosphatase: 70 U/L (ref 38–126)
Anion gap: 8 (ref 5–15)
BUN: 21 mg/dL (ref 8–23)
CO2: 18 mmol/L — ABNORMAL LOW (ref 22–32)
Calcium: 8 mg/dL — ABNORMAL LOW (ref 8.9–10.3)
Chloride: 113 mmol/L — ABNORMAL HIGH (ref 98–111)
Creatinine, Ser: 1.03 mg/dL — ABNORMAL HIGH (ref 0.44–1.00)
GFR, Estimated: >60 mL/min (ref >60)
Glucose, Bld: 125 mg/dL — ABNORMAL HIGH (ref 70–99)
Potassium: 3.6 mmol/L (ref 3.5–5.1)
Sodium: 139 mmol/L (ref 135–145)
Total Bilirubin: 1.2 mg/dL (ref 0.0–1.2)
Total Protein: 4.8 g/dL — ABNORMAL LOW (ref 6.5–8.1)

## 2023-05-29 LAB — CBC WITH DIFFERENTIAL/PLATELET
Abs Immature Granulocytes: 0.01 10*3/uL (ref 0.00–0.07)
Basophils Absolute: 0 10*3/uL (ref 0.0–0.1)
Basophils Relative: 1 %
Eosinophils Absolute: 0.4 10*3/uL (ref 0.0–0.5)
Eosinophils Relative: 10 %
HCT: 27.3 % — ABNORMAL LOW (ref 36.0–46.0)
Hemoglobin: 8 g/dL — ABNORMAL LOW (ref 12.0–15.0)
Immature Granulocytes: 0 %
Lymphocytes Relative: 29 %
Lymphs Abs: 1.1 10*3/uL (ref 0.7–4.0)
MCH: 29.6 pg (ref 26.0–34.0)
MCHC: 29.3 g/dL — ABNORMAL LOW (ref 30.0–36.0)
MCV: 101.1 fL — ABNORMAL HIGH (ref 80.0–100.0)
Monocytes Absolute: 0.4 10*3/uL (ref 0.1–1.0)
Monocytes Relative: 11 %
Neutro Abs: 1.9 10*3/uL (ref 1.7–7.7)
Neutrophils Relative %: 49 %
Platelets: 100 10*3/uL — ABNORMAL LOW (ref 150–400)
RBC: 2.7 MIL/uL — ABNORMAL LOW (ref 3.87–5.11)
RDW: 17.4 % — ABNORMAL HIGH (ref 11.5–15.5)
WBC: 3.9 10*3/uL — ABNORMAL LOW (ref 4.0–10.5)
nRBC: 0 % (ref 0.0–0.2)

## 2023-05-29 LAB — AMMONIA: Ammonia: 95 umol/L — ABNORMAL HIGH (ref 9–35)

## 2023-05-29 LAB — MAGNESIUM: Magnesium: 1.8 mg/dL (ref 1.7–2.4)

## 2023-05-29 MED ORDER — POLYETHYLENE GLYCOL 3350 17 G PO PACK: 17.0000 g | PACK | Freq: Every day | ORAL | 0 refills | Status: DC | PRN

## 2023-05-29 MED ORDER — MECLIZINE HCL 25 MG PO TABS: 25.0000 mg | ORAL_TABLET | Freq: Three times a day (TID) | ORAL | 0 refills | Status: DC | PRN

## 2023-05-29 MED ORDER — SENNOSIDES-DOCUSATE SODIUM 8.6-50 MG PO TABS: 1.0000 | ORAL_TABLET | Freq: Two times a day (BID) | ORAL | 0 refills | Status: DC

## 2023-05-29 MED ORDER — FUROSEMIDE 40 MG PO TABS
20.0000 mg | ORAL_TABLET | Freq: Every day | ORAL | Status: DC
Start: 2023-05-29 — End: 2023-07-10

## 2023-05-29 MED ORDER — CEPHALEXIN 500 MG PO CAPS: 500.0000 mg | ORAL_CAPSULE | Freq: Three times a day (TID) | ORAL | 0 refills | Status: AC

## 2023-05-29 MED ORDER — NYSTATIN 100000 UNIT/GM EX CREA: 1.0000 | TOPICAL_CREAM | Freq: Two times a day (BID) | CUTANEOUS | Status: DC | PRN

## 2023-05-29 MED ORDER — LACTULOSE 10 GM/15ML PO SOLN: 30.0000 g | Freq: Three times a day (TID) | ORAL | 4 refills | Status: DC

## 2023-05-29 MED ORDER — HYDROCODONE-ACETAMINOPHEN 10-325 MG PO TABS: 0.5000 | ORAL_TABLET | Freq: Two times a day (BID) | ORAL | Status: DC | PRN

## 2023-05-29 NOTE — Plan of Care (Signed)
   Problem: Coping: Goal: Level of anxiety will decrease Outcome: Progressing   Problem: Pain Managment: Goal: General experience of comfort will improve and/or be controlled Outcome: Progressing   Problem: Safety: Goal: Ability to remain free from injury will improve Outcome: Progressing

## 2023-05-29 NOTE — Plan of Care (Signed)
  Problem: Clinical Measurements: Goal: Ability to maintain clinical measurements within normal limits will improve Outcome: Progressing Goal: Will remain free from infection Outcome: Progressing Goal: Diagnostic test results will improve Outcome: Progressing   Problem: Activity: Goal: Risk for activity intolerance will decrease Outcome: Progressing   Problem: Coping: Goal: Level of anxiety will decrease Outcome: Progressing   Problem: Pain Managment: Goal: General experience of comfort will improve and/or be controlled Outcome: Progressing   Problem: Safety: Goal: Ability to remain free from injury will improve Outcome: Progressing

## 2023-05-29 NOTE — TOC Transition Note (Signed)
 Transition of Care [facility]) - Discharge Note   Patient Details  Name: [name] MRN: [id number] Date of Birth: 07-Jul-1955  [facility] CM/SW Contact:  Delilah Fend, LCSW Phone Number: 05/29/2023, 11:10 AM   Clinical Narrative:     Pt medically cleared for dc home today.  HH orders placed for services to resume with [facility].  Pt has needed DME in the home.  No further TOC needs.  Final next level of care: Home w Home Health Services Barriers to Discharge: Barriers Resolved   Patient Goals and CMS Choice Patient states their goals for this hospitalization and ongoing recovery are:: return home          Discharge Placement                       Discharge Plan and Services Additional resources added to the After Visit Summary for                  DME Arranged: N/A DME Agency: NA       HH Arranged: PT HH Agency: [facility] Date [facility] Agency Contacted: 05/29/23   Representative spoke with at [facility] Agency: Amy  Social Drivers of Health (SDOH) Interventions SDOH Screenings   Food Insecurity: No Food Insecurity (05/23/2023)  Housing: Low Risk  (05/23/2023)  Transportation Needs: No Transportation Needs (05/23/2023)  Utilities: Not At Risk (05/23/2023)  Depression (PHQ2-9): Medium Risk (03/06/2023)  Social Connections: Unknown (05/23/2023)  Recent Concern: Social Connections - Socially Isolated (04/06/2023)  Tobacco Use: Medium Risk (05/23/2023)     Readmission Risk Interventions    04/07/2023    1:23 PM 03/09/2023   11:21 AM 10/19/2022   10:12 AM  Readmission Risk Prevention Plan  Transportation Screening Complete Complete Complete  Medication Review Oceanographer) Complete Complete Complete  PCP or Specialist appointment within 3-5 days of discharge  Complete Complete  HRI or Home Care Consult Complete Complete Complete  SW Recovery Care/Counseling Consult Complete Complete Complete  Palliative Care Screening Not Applicable Not Applicable  Not Applicable  Skilled Nursing Facility Not Applicable Not Applicable Not Applicable

## 2023-05-29 NOTE — Discharge Summary (Signed)
 Physician Discharge Summary  [name] ZOX:[id number] DOB: 04-20-1955 DOA: 05/22/2023  PCP: Amadeo June, MD  Admit date: 05/22/2023 Discharge date: 05/29/2023  Admitted From: Home Disposition: Home  Recommendations for Outpatient Follow-up:  Follow up with PCP in 1 week with repeat CBC/CMP Outpatient follow-up with GI Follow up in ED if symptoms worsen or new appear   Home Health: No Equipment/Devices: None  Discharge Condition: Guarded  CODE STATUS: Full Diet recommendation: Heart healthy/fluid restriction of up to 1400 cc a day  Brief/Interim Summary: 68 y.o. female with history of liver cirrhosis secondary to [place], hypothyroidism, hyperlipidemia, GERD, chronic anemia and thrombocytopenia, recurrent admissions for hepatic encephalopathy, coagulopathy, chronic abdominal pain, restless leg syndrome, essential hypertension, chronic hypotension, GERD, generalized anxiety disorder, abdominal wall hernia, short gut syndrome, chronic copper  deficiency and morbid obesity presented with confusion and she was admitted for hepatic encephalopathy and started on lactulose .  During the hospitalization, she was also found to have possible UTI and was started on IV antibiotics.  Dose of lactulose  was increased.  Her condition has gradually improved.  She feels slightly better and feels okay to go home today.  She will be discharged home on few more days of oral Keflex  and to continue her lactulose .  Outpatient follow-up with PCP and GI.  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  Ammonia still elevated but improving.  Continue rifaximin .  Recommended to to avoid narcotics as much as she can. - Continues to have dizziness.  Spironolactone  has been  discontinued.  Will resume Lasix  20 mg daily for now.  Continue midodrine : Blood pressure intermittently still on the lower side.   - No signs of bleeding.  Monitor platelets intermittently as an outpatient -Continue increased dose of lactulose .  Patient feels better today.  Feels okay to go home today.  Discharge patient home.  With outpatient follow-up with PCP and GI.   Acute kidney injury on chronic kidney disease stage II - Creatinine 1.03 today.  Treated with IV fluids and discontinued.  Diuretic plan as above.  Monitor intermittently as an outpatient   History of hypertension Chronic hypotension - blood pressure still intermittently on the lower side.  Continue midodrine .  Diuretic plan as above.  Outpatient follow-up   Short gut syndrome Abdominal wall hernia Chronic abdominal pain GERD - Continue Bentyl  and Protonix .  Outpatient follow-up with GI   Possible UTI with dysuria and right flank pain - Urine cultures grew multiple species.  Currently on Rocephin , started on 05/27/2023.  Discharged home on Keflex  for 5 more days.   Restless leg syndrome - Continue Requip    Chronic hypomagnesemia - Continue supplementation   Hypothyroidism -Continue levothyroxine    Generalized anxiety disorder - Continue venlafaxine  XR   Anemia of chronic disease - From chronic illnesses.  Hemoglobin stable.  Monitor intermittently   Physical deconditioning - PT recommending home health PT.   Chronic metabolic acidosis - Mild.  Monitor intermittently as an outpatient.   History of copper  deficiency - Continue supplementation   Obesity class III - Outpatient follow-up  Discharge Instructions  Discharge Instructions     Diet - low sodium heart healthy   Complete by: As directed    Increase activity slowly   Complete by: As directed    No wound care   Complete by:  As directed       Allergies as of 05/29/2023       Reactions   Ace Inhibitors Anaphylaxis, Swelling   Ivp Dye  [iodinated Contrast Media] Anaphylaxis   Desitin [zinc  Oxide] Rash, Other (See Comments)   Worsens rash   Gadolinium Derivatives Other (See Comments)   Reaction??   Hydroxyzine  Other (See Comments)   Affects the mind   Chlorhexidine  Rash, Other (See Comments)   WORSENS RASHES   Doxycycline Rash   Penicillins Rash   Zofran  [ondansetron ] Rash        Medication List     STOP taking these medications    methocarbamol  500 MG tablet Commonly known as: ROBAXIN    spironolactone  50 MG tablet Commonly known as: ALDACTONE    triamcinolone  cream 0.5 % Commonly known as: KENALOG        TAKE these medications    albuterol  108 (90 Base) MCG/ACT inhaler Commonly known as: VENTOLIN  HFA Inhale 2 puffs into the lungs every 6 (six) hours as needed for shortness of breath or wheezing.   atorvastatin  40 MG tablet Commonly known as: LIPITOR Take 1 tablet (40 mg total) by mouth in the morning.   CENTRUM SILVER ULTRA WOMENS PO Take 1 tablet by mouth in the morning.   cephALEXin  500 MG capsule Commonly known as: KEFLEX  Take 1 capsule (500 mg total) by mouth 3 (three) times daily for 5 days.   copper  tablet Take 2 mg by mouth in the morning and at bedtime.   cyanocobalamin  1000 MCG tablet Commonly known as: VITAMIN B12 Take 2,000 mcg by mouth every Monday, Wednesday, and Friday.   dicyclomine  10 MG capsule Commonly known as: BENTYL  Take 1 capsule (10 mg total) by mouth 3 (three) times daily as needed for spasms (abdominal cramping).   famotidine  20 MG tablet Commonly known as: PEPCID  Take 20 mg by mouth in the morning.   folic acid  1 MG tablet Commonly known as: FOLVITE  Take 1 tablet (1 mg total) by mouth daily.   furosemide  40 MG tablet Commonly known as: LASIX  Take 0.5 tablets (20 mg total) by mouth daily. What changed:  how much to take when to take this Another medication with the same name was removed. Continue taking this medication, and follow the directions you  see here.   HYDROcodone -acetaminophen  10-325 MG tablet Commonly known as: NORCO Take 0.5 tablets by mouth every 12 (twelve) hours as needed for moderate pain (pain score 4-6). What changed:  how much to take when to take this   IRON -VITAMIN C  PO Take 1 tablet by mouth daily with breakfast.   lactulose  10 GM/15ML solution Commonly known as: CHRONULAC  Take 45 mLs (30 g total) by mouth 3 (three) times daily. Take to ensure that there is 2 loose bowel movement EVERY DAY What changed: how much to take   levothyroxine  175 MCG tablet Commonly known as: SYNTHROID  Take 1 tablet (175 mcg total) by mouth daily at 6 (six) AM. Recheck TSH with PCP in 4 weeks   lidocaine  5 % Commonly known as: LIDODERM  Place 1 patch onto the skin daily. Remove & Discard patch within 12 hours or as directed by MD What changed:  when to take this reasons to take this additional instructions   magnesium  gluconate 500 (27 Mg) MG Tabs tablet Commonly known as: MAGONATE Take 1 tablet (500 mg total) by mouth in the morning and at bedtime. What changed:  how much to take when to take this   meclizine  25 MG  tablet Commonly known as: ANTIVERT  Take 1 tablet (25 mg total) by mouth 3 (three) times daily as needed for dizziness.   megestrol  40 MG tablet Commonly known as: MEGACE  Take 1 tablet (40 mg total) by mouth 2 (two) times daily.   midodrine  10 MG tablet Commonly known as: PROAMATINE  Take 1 tablet (10 mg total) by mouth 3 (three) times daily with meals.   naloxone 4 MG/0.1ML Liqd nasal spray kit Commonly known as: NARCAN Place 1 spray into the nose as needed (accidental overdose).   nystatin  cream Commonly known as: MYCOSTATIN  Apply 1 Application topically 2 (two) times daily as needed for dry skin.   pantoprazole  40 MG tablet Commonly known as: PROTONIX  Take 1 tablet (40 mg total) by mouth 2 (two) times daily before a meal.   polyethylene glycol 17 g packet Commonly known as: MIRALAX  /  GLYCOLAX  Take 17 g by mouth daily as needed for moderate constipation.   potassium chloride  10 MEQ tablet Commonly known as: KLOR-CON  Take 20 mEq by mouth 2 (two) times daily.   rifaximin  550 MG Tabs tablet Commonly known as: XIFAXAN  Take 550 mg by mouth 2 (two) times daily.   rOPINIRole  0.25 MG tablet Commonly known as: REQUIP  Take 1 tablet (0.25 mg total) by mouth at bedtime.   senna-docusate 8.6-50 MG tablet Commonly known as: Senokot-S Take 1 tablet by mouth 2 (two) times daily.   tamsulosin  0.4 MG Caps capsule Commonly known as: FLOMAX  Take 1 capsule (0.4 mg total) by mouth daily.   TobraDex ophthalmic ointment Generic drug: tobramycin-dexamethasone  Place 1 Application into the left eye daily as needed.   venlafaxine  XR 75 MG 24 hr capsule Commonly known as: EFFEXOR -XR Take 1 capsule (75 mg total) by mouth daily with breakfast.   Vitamin D (Ergocalciferol) 1.25 MG (50000 UNIT) Caps capsule Commonly known as: DRISDOL Take 50,000 Units by mouth every Wednesday.          Follow-up Information     Amadeo June, MD. Schedule an appointment as soon as possible for a visit in 1 week(s).   Specialty: Family Medicine Why: with repeat cbc/cmp Contact information: [address] [place] [address] [phone]         gastroenterologist. Schedule an appointment as soon as possible for a visit in 1 week(s).                 Allergies  Allergen Reactions   Ace Inhibitors Anaphylaxis and Swelling   Ivp Dye [Iodinated Contrast Media] Anaphylaxis   Desitin [Zinc  Oxide] Rash and Other (See Comments)    Worsens rash   Gadolinium Derivatives Other (See Comments)    Reaction??   Hydroxyzine  Other (See Comments)    Affects the mind   Chlorhexidine  Rash and Other (See Comments)    WORSENS RASHES   Doxycycline Rash   Penicillins Rash   Zofran  [Ondansetron ] Rash    Consultations: None   Procedures/Studies: DG Abd 1 View Result Date:  05/27/2023 CLINICAL DATA:  Abdominal pain. EXAM: ABDOMEN - 1 VIEW COMPARISON:  CT 05/03/2023 FINDINGS: Uppermost abdomen and lower most pelvis are not included in the field of view. No bowel dilatation to suggest obstruction. Moderate stool within the transverse and descending colon. Enteric sutures in the right abdomen. Calcified uterine fibroids in the pelvis. Cholecystectomy clips in the right upper quadrant. Abdominal vascular calcifications are seen. IMPRESSION: Nonobstructive bowel gas pattern. Moderate stool in the transverse and descending colon. Electronically Signed   By: Prentice Brochure  Sanford M.D.   On: 05/27/2023 12:33   CT HEAD WO CONTRAST ( ) Result Date: 05/22/2023 CLINICAL DATA:  Altered mental status EXAM: CT HEAD WITHOUT CONTRAST TECHNIQUE: Contiguous axial images were obtained from the base of the skull through the vertex without intravenous contrast. RADIATION DOSE REDUCTION: This exam was performed according to the departmental dose-optimization program which includes automated exposure control, adjustment of the mA and/or kV according to patient size and/or use of iterative reconstruction technique. COMPARISON:  05/03/2023 FINDINGS: Brain: No evidence of acute infarction, hemorrhage, hydrocephalus, extra-axial collection or mass lesion/mass effect. Chronic white matter ischemic changes are noted stable from the prior exam. Vascular: No hyperdense vessel or unexpected calcification. Skull: Normal. Negative for fracture or focal lesion. Sinuses/Orbits: No acute finding. Other: None. IMPRESSION: Chronic white matter ischemic changes.  No acute abnormality noted. Electronically Signed   By: Violeta Grey M.D.   On: 05/22/2023 20:43   DG Chest 2 View Result Date: 05/22/2023 CLINICAL DATA:  Altered mental status with nausea EXAM: CHEST - 2 VIEW COMPARISON:  05/03/2023 FINDINGS: Cardiac shadow is mildly prominent but accentuated by the frontal technique. Aortic calcifications are seen. The lungs are  clear bilaterally. No effusion is seen. Changes of prior vertebral augmentation in the lower thoracic spine are noted. IMPRESSION: No active cardiopulmonary disease. Electronically Signed   By: Violeta Grey M.D.   On: 05/22/2023 20:23   CT ABDOMEN PELVIS WO CONTRAST Result Date: 05/03/2023 CLINICAL DATA:  Abdominal pain, acute, nonlocalized. has had some dark colored urine. Pt son reports pt has been more awake than her normal self and he "wants to get her seen early before she is not doing good" P EXAM: CT ABDOMEN AND PELVIS WITHOUT CONTRAST TECHNIQUE: Multidetector CT imaging of the abdomen and pelvis was performed following the standard protocol without IV contrast. RADIATION DOSE REDUCTION: This exam was performed according to the departmental dose-optimization program which includes automated exposure control, adjustment of the mA and/or kV according to patient size and/or use of iterative reconstruction technique. COMPARISON:  None Available. FINDINGS: Lower chest: Coronary artery calcification. Mitral annular calcification. Hepatobiliary: Nodular hepatic contour. No focal liver abnormality. Status post cholecystectomy. No biliary dilatation. Pancreas: No focal lesion. Normal pancreatic contour. No surrounding inflammatory changes. No main pancreatic ductal dilatation. Spleen: Enlarged on axial measurements measuring up to 13.5 cm. No focal abnormality. Adrenals/Urinary Tract: No adrenal nodule bilaterally. 2 mm right nephrolithiasis. No left nephrolithiasis. No ureterolithiasis. No hydroureteronephrosis. The urinary bladder is unremarkable. Stomach/Bowel: Stomach is within normal limits. No evidence of bowel wall thickening or dilatation. Status post appendectomy. Likely partial right colectomy. Vascular/Lymphatic: No abdominal aorta or iliac aneurysm. Severe atherosclerotic plaque of the aorta and its branches. No abdominal, pelvic, or inguinal lymphadenopathy. Reproductive: Calcified uterine fibroids.  Otherwise uterus and bilateral adnexa are unremarkable. Other: No intraperitoneal free fluid. No intraperitoneal free gas. No organized fluid collection. Musculoskeletal: Small fat containing umbilical hernia.  Diastasis rectus. No suspicious lytic or blastic osseous lesions. No acute displaced fracture. Multilevel degenerative changes of the spine. T12 compression fracture status post kyphoplasty. Chronic appearing T11 compression fracture. IMPRESSION: 1. No acute intra-abdominal and intrapelvic abnormality with limited evaluation on this noncontrast study. 2. Cirrhosis with portal hypertension. No focal liver lesions identified. Please note that liver protocol enhanced MR and CT are the most sensitive tests for the screening detection of hepatocellular carcinoma in the high risk setting of cirrhosis. 3. Nonobstructive 2 mm right nephrolithiasis. 4. Uterine fibroids. 5. Aortic Atherosclerosis (ICD10-I70.0) including mitral annular and  coronary artery calcification. Electronically Signed   By: Morgane  Naveau M.D.   On: 05/03/2023 22:43   CT Head Wo Contrast Result Date: 05/03/2023 CLINICAL DATA:  Altered mental status EXAM: CT HEAD WITHOUT CONTRAST TECHNIQUE: Contiguous axial images were obtained from the base of the skull through the vertex without intravenous contrast. RADIATION DOSE REDUCTION: This exam was performed according to the departmental dose-optimization program which includes automated exposure control, adjustment of the mA and/or kV according to patient size and/or use of iterative reconstruction technique. COMPARISON:  03/05/2023 FINDINGS: Brain: There is no mass, hemorrhage or extra-axial collection. The size and configuration of the ventricles and extra-axial CSF spaces are normal. There is hypoattenuation of the white matter, most commonly indicating chronic small vessel disease. Vascular: Atherosclerotic calcification of the vertebral and internal carotid arteries at the skull base. No  abnormal hyperdensity of the major intracranial arteries or dural venous sinuses. Skull: The visualized skull base, calvarium and extracranial soft tissues are normal. Sinuses/Orbits: No fluid levels or advanced mucosal thickening of the visualized paranasal sinuses. No mastoid or middle ear effusion. Normal orbits. Other: None. IMPRESSION: 1. No acute intracranial abnormality. 2. Chronic small vessel disease. Electronically Signed   By: Juanetta Nordmann M.D.   On: 05/03/2023 22:11   DG Chest Portable 1 View Result Date: 05/03/2023 CLINICAL DATA:  Chest pain EXAM: PORTABLE CHEST 1 VIEW COMPARISON:  April 05, 2023 FINDINGS: The heart size and mediastinal contours are within normal limits. Both lungs are clear. The visualized skeletal structures are unremarkable. IMPRESSION: No active disease. Electronically Signed   By: Fredrich Jefferson M.D.   On: 05/03/2023 15:59      Subjective: Patient seen and examined at bedside.  Feels better and wants to go home today.  No fever, vomiting, chest pain reported.  Continues to have intermittent abdominal pain.  Having bowel movements.  Discharge Exam: Vitals:   05/28/23 2134 05/29/23 0526  BP: (!) 140/49 113/60  Pulse: 65 80  Resp: 18 16  Temp: 97.7 F (36.5 C) 98.1 F (36.7 C)  SpO2: 100% 98%    General: Pt is alert, awake, not in acute distress.  Looks chronically ill and deconditioned and slow to respond.  On room air Cardiovascular: rate controlled, S1/S2 + Respiratory: bilateral decreased breath sounds at bases with scattered crackles Abdominal: Soft, NT, slightly distended, bowel sounds + Extremities: Trace lower extremity edema; no cyanosis    The results of significant diagnostics from this hospitalization (including imaging, microbiology, ancillary and laboratory) are listed below for reference.     Microbiology: Recent Results (from the past 240 hours)  Urine Culture (for pregnant, neutropenic or urologic patients or patients with an  indwelling urinary catheter)     Status: Abnormal   Collection Time: 05/27/23 10:10 AM   Specimen: Urine, Clean Catch  Result Value Ref Range Status   Specimen Description   Final    URINE, CLEAN CATCH Performed at [facility], [address], [place], [place] [address]    Special Requests   Final    NONE Performed at [facility], [address], [place], [place] [address]    Culture MULTIPLE SPECIES PRESENT, SUGGEST RECOLLECTION (A)  Final   Report Status 05/28/2023 FINAL  Final     Labs: BNP (last 3 results) Recent Labs    08/30/22 0354 01/03/23 0616  BNP 614.9* 469.4*   Basic Metabolic Panel: Recent Labs  Lab 05/22/23 1958 05/23/23 0327 05/25/23 0321 05/26/23 0320 05/27/23 0981  05/28/23 0356 05/29/23 0313  NA 141   < > 139 135 139 141 139  K 4.1   < > 3.6 3.3* 3.7 3.8 3.6  CL 113*   < > 112* 109 113* 113* 113*  CO2 21*   < > 20* 22 20* 21* 18*  GLUCOSE 117*   < > 97 131* 110* 117* 125*  BUN 28*   < > 24* 22 20 21 21   CREATININE 1.30*   < > 1.01* 1.20* 1.10* 1.12* 1.03*  CALCIUM  8.5*   < > 8.3* 7.9* 8.0* 8.0* 8.0*  MG 2.2  --   --  1.8 1.8 1.9 1.8   < > = values in this interval not displayed.   Liver Function Tests: Recent Labs  Lab 05/25/23 0321 05/26/23 0320 05/27/23 0415 05/28/23 0356 05/29/23 0313  AST 31 32 32 29 27  ALT 22 22 22 20 21   ALKPHOS 80 81 77 72 70  BILITOT 1.7* 1.8* 1.7* 1.2 1.2  PROT 5.2* 5.4* 4.9* 4.9* 4.8*  ALBUMIN  2.4* 2.5* 2.4* 2.3* 2.3*   Recent Labs  Lab 05/22/23 1958  LIPASE 58*   Recent Labs  Lab 05/22/23 1958 05/23/23 0327 05/28/23 0356 05/29/23 0313  AMMONIA 129* 113* 117* 95*   CBC: Recent Labs  Lab 05/22/23 1958 05/23/23 0327 05/25/23 0321 05/26/23 0320 05/27/23 0415 05/28/23 0356 05/29/23 0313  WBC 7.2   < > 4.2 5.0 4.1 4.3 3.9*  NEUTROABS 4.2  --   --  2.7 2.0 2.1 1.9  HGB 10.1*   < > 8.7* 8.7* 8.4* 8.3* 8.0*  HCT 32.9*   < > 29.8* 29.9* 28.2* 28.0* 27.3*   MCV 95.4   < > 99.0 100.0 98.6 100.0 101.1*  PLT 162   < > 114* 110* 108* 101* 100*   < > = values in this interval not displayed.   Cardiac Enzymes: No results for input(s): "CKTOTAL", "CKMB", "CKMBINDEX", "TROPONINI" in the last 168 hours. BNP: Invalid input(s): "POCBNP" CBG: No results for input(s): "GLUCAP" in the last 168 hours. D-Dimer No results for input(s): "DDIMER" in the last 72 hours. Hgb A1c No results for input(s): "HGBA1C" in the last 72 hours. Lipid Profile No results for input(s): "CHOL", "HDL", "LDLCALC", "TRIG", "CHOLHDL", "LDLDIRECT" in the last 72 hours. Thyroid  function studies No results for input(s): "TSH", "T4TOTAL", "T3FREE", "THYROIDAB" in the last 72 hours.  Invalid input(s): "FREET3" Anemia work up No results for input(s): "VITAMINB12", "FOLATE", "FERRITIN", "TIBC", "IRON ", "RETICCTPCT" in the last 72 hours. Urinalysis    Component Value Date/Time   COLORURINE YELLOW 05/27/2023 1010   APPEARANCEUR CLEAR 05/27/2023 1010   LABSPEC 1.019 05/27/2023 1010   PHURINE 5.0 05/27/2023 1010   GLUCOSEU NEGATIVE 05/27/2023 1010   HGBUR NEGATIVE 05/27/2023 1010   BILIRUBINUR NEGATIVE 05/27/2023 1010   KETONESUR NEGATIVE 05/27/2023 1010   PROTEINUR NEGATIVE 05/27/2023 1010   UROBILINOGEN 0.2 08/21/2013 0017   NITRITE NEGATIVE 05/27/2023 1010   LEUKOCYTESUR NEGATIVE 05/27/2023 1010   Sepsis Labs Recent Labs  Lab 05/26/23 0320 05/27/23 0415 05/28/23 0356 05/29/23 0313  WBC 5.0 4.1 4.3 3.9*   Microbiology Recent Results (from the past 240 hours)  Urine Culture (for pregnant, neutropenic or urologic patients or patients with an indwelling urinary catheter)     Status: Abnormal   Collection Time: 05/27/23 10:10 AM   Specimen: Urine, Clean Catch  Result Value Ref Range Status   Specimen Description   Final    URINE, CLEAN CATCH Performed at [facility], [address], [place], [place] [address]    Special Requests   Final     NONE Performed at [facility], [address] [place], [place] [address]    Culture MULTIPLE SPECIES PRESENT, SUGGEST RECOLLECTION (A)  Final   Report Status 05/28/2023 FINAL  Final     Time coordinating discharge: 35 minutes  SIGNED:   Audria Leather, MD  [facility] 05/29/2023, 10:34 AM

## 2023-05-30 ENCOUNTER — Telehealth: Payer: Self-pay

## 2023-05-30 NOTE — Transitions of Care (Post Inpatient/ED Visit) (Signed)
 05/30/2023  Name: [name] MRN: [id number] DOB: 10/30/1955  Today's TOC FU Call Status: Today's TOC FU Call Status:: Successful TOC FU Call Completed TOC FU Call Complete Date: 05/30/23 Patient's Name and Date of Birth confirmed.  Transition Care Management Follow-up Telephone Call Date of Discharge: 05/29/23 Discharge Facility: [facility]) Type of Discharge: Inpatient Admission Primary Inpatient Discharge Diagnosis:: encephalopathy How have you been since you were released from the hospital?: Better Any questions or concerns?: No  Items Reviewed: Did you receive and understand the discharge instructions provided?: Yes Medications obtained,verified, and reconciled?: Yes (Medications Reviewed) Any new allergies since your discharge?: No Dietary orders reviewed?: Yes Do you have support at home?: Yes People in Home [RPT]: child(ren), adult  Medications Reviewed Today: Medications Reviewed Today     Reviewed by Darrall Ellison, LPN (Licensed Practical Nurse) on 05/30/23 at [id number]  Med List Status: <None>   Medication Order Taking? Sig Documenting Provider Last Dose Status Informant  albuterol  (VENTOLIN  HFA) 108 (90 Base) MCG/ACT inhaler [id number] No Inhale 2 puffs into the lungs every 6 (six) hours as needed for shortness of breath or wheezing. [provider] Past Month Active Child  atorvastatin  (LIPITOR) 40 MG tablet [id number] No Take 1 tablet (40 mg total) by mouth in the morning. Amadeo June, MD 05/22/2023 Morning Active Child  cephALEXin  (KEFLEX ) 500 MG capsule [id number]  Take 1 capsule (500 mg total) by mouth 3 (three) times daily for 5 days. Audria Leather, MD  Active   copper  tablet [id number] No Take 2 mg by mouth in the morning and at bedtime. [provider] 05/22/2023 Morning Active Child  cyanocobalamin  (VITAMIN B12) 1000 MCG tablet [id number] No Take 2,000 mcg by mouth every Monday, Wednesday, and Friday. [provider] 05/19/2023  Active Child  dicyclomine  (BENTYL ) 10 MG capsule [id number] No Take 1 capsule (10 mg total) by mouth 3 (three) times daily as needed for spasms (abdominal cramping). Faith Homes, MD 05/22/2023 Morning Active Child  famotidine  (PEPCID ) 20 MG tablet [id number] No Take 20 mg by mouth in the morning. [provider] 05/22/2023 Morning Active Child  folic acid  (FOLVITE ) 1 MG tablet [id number] No Take 1 tablet (1 mg total) by mouth daily. Rizwan, [name], MD 05/22/2023 Morning Active Child  furosemide  (LASIX ) 40 MG tablet [id number]  Take 0.5 tablets (20 mg total) by mouth daily. Audria Leather, MD  Active   HYDROcodone -acetaminophen  (NORCO) 10-325 MG tablet [id number]  Take 0.5 tablets by mouth every 12 (twelve) hours as needed for moderate pain (pain score 4-6). Audria Leather, MD  Active   IRON -VITAMIN C  PO [id number] No Take 1 tablet by mouth daily with breakfast. [provider] 05/22/2023 Morning Active Child  lactulose  (CHRONULAC ) 10 GM/15ML solution [id number]  Take 45 mLs (30 g total) by mouth 3 (three) times daily. Take to ensure that there is 2 loose bowel movement EVERY DAY Audria Leather, MD  Active   levothyroxine  (SYNTHROID ) 175 MCG tablet [id number] No Take 1 tablet (175 mcg total) by mouth daily at 6 (six) AM. Recheck TSH with PCP in 4 weeks [name], [name], DO 05/22/2023 Morning Active Child  lidocaine  (LIDODERM ) 5 % [id number] No Place 1 patch onto the skin daily. Remove & Discard patch within 12 hours or as directed by MD  Patient taking differently: Place 1 patch onto the skin daily as needed (for pain- Remove & Discard patch within 12 hours or as directed by MD).   Kraig Peru, MD 05/19/2023 Active Child  Med Note Belvie Boyers   Sat Dec 10, 2022 11:45 AM)    magnesium  gluconate (MAGONATE) 500 (27 Mg) MG TABS tablet [id number] No Take 1 tablet (500 mg total) by mouth in the morning and at bedtime.  Patient taking differently: Take 1,000 mg by mouth daily.    Amadeo June, MD 05/22/2023 Morning Active Child  meclizine  (ANTIVERT ) 25 MG tablet [id number]  Take 1 tablet (25 mg total) by mouth 3 (three) times daily as needed for dizziness. Audria Leather, MD  Active   megestrol  (MEGACE ) 40 MG tablet [id number] No Take 1 tablet (40 mg total) by mouth 2 (two) times daily. Ajewole, [name], MD 05/22/2023 Morning Active Child  midodrine  (PROAMATINE ) 10 MG tablet [id number] No Take 1 tablet (10 mg total) by mouth 3 (three) times daily with meals. Faith Homes, MD 05/22/2023 Morning Active Child  Multiple Vitamins-Minerals (CENTRUM SILVER ULTRA WOMENS PO) [id number] No Take 1 tablet by mouth in the morning. [provider] 05/22/2023 Morning Active Child  naloxone (NARCAN) nasal spray 4 mg/0.1 mL [id number] No Place 1 spray into the nose as needed (accidental overdose). [provider] Unknown Active Child           Med Note Audelia Leaks, SUSAN A   Wed Mar 08, 2023  2:39 PM)    nystatin  cream (MYCOSTATIN ) [id number]  Apply 1 Application topically 2 (two) times daily as needed for dry skin. Audria Leather, MD  Active   pantoprazole  (PROTONIX ) 40 MG tablet [id number] No Take 1 tablet (40 mg total) by mouth 2 (two) times daily before a meal. Faith Homes, MD 05/22/2023 Morning Active Child  polyethylene glycol (MIRALAX  / GLYCOLAX ) 17 g packet [id number]  Take 17 g by mouth daily as needed for moderate constipation. Audria Leather, MD  Active   potassium chloride  (KLOR-CON ) 10 MEQ tablet [id number] No Take 20 mEq by mouth 2 (two) times daily. [provider] 05/22/2023 Morning Active Child  rifaximin  (XIFAXAN ) 550 MG TABS tablet [id number] No Take 550 mg by mouth 2 (two) times daily. [provider] 05/22/2023 Morning Active Child  rOPINIRole  (REQUIP ) 0.25 MG tablet [id number] No Take 1 tablet (0.25 mg total) by mouth at bedtime. Amadeo June, MD 05/22/2023 Morning Active Child  senna-docusate (SENOKOT-S) 8.6-50 MG tablet [id number]   Take 1 tablet by mouth 2 (two) times daily. Audria Leather, MD  Active   tamsulosin  (FLOMAX ) 0.4 MG CAPS capsule [id number] No Take 1 capsule (0.4 mg total) by mouth daily.  Patient not taking: Reported on 05/23/2023   Faith Homes, MD Not Taking Active Child           Med Note Emmy Harper Apr 06, 2023  8:01 AM) Dispense record does not support.   TOBRADEX ophthalmic ointment [id number] No Place 1 Application into the left eye daily as needed. [provider] 05/22/2023 Morning Active Child  venlafaxine  XR (EFFEXOR -XR) 75 MG 24 hr capsule [id number] No Take 1 capsule (75 mg total) by mouth daily with breakfast. Amadeo June, MD 05/22/2023 Morning Active Child  Vitamin D, Ergocalciferol, (DRISDOL) 50000 UNITS CAPS [id number] No Take 50,000 Units by mouth every Wednesday. [provider] 05/17/2023 Active Child  Med List Note Mliss Anderson Gioia Laity 03/08/23 1450): Pt's son Aliea Bobe helps with medications            Home Care and Equipment/Supplies: Were Home Health Services Ordered?: NA Any new equipment or medical supplies ordered?: NA  Functional Questionnaire: Do you need assistance  with bathing/showering or dressing?: No Do you need assistance with meal preparation?: Yes Do you need assistance with eating?: No Do you have difficulty maintaining continence: No Do you need assistance with getting out of bed/getting out of a chair/moving?: No Do you have difficulty managing or taking your medications?: Yes  Follow up appointments reviewed: PCP Follow-up appointment confirmed?: Yes Date of PCP follow-up appointment?: 06/02/23 Follow-up Provider: [facility] Follow-up appointment confirmed?: NA Do you need transportation to your follow-up appointment?: No Do you understand care options if your condition(s) worsen?: Yes-patient verbalized understanding    SIGNATURE Darrall Ellison, LPN [facility] Nurse Health Advisor Direct Dial  [phone]

## 2023-06-02 ENCOUNTER — Inpatient Hospital Stay: Payer: Medicare (Managed Care) | Admitting: Family Medicine

## 2023-06-02 ENCOUNTER — Ambulatory Visit: Payer: Medicare (Managed Care) | Admitting: Family Medicine

## 2023-06-07 ENCOUNTER — Encounter (HOSPITAL_COMMUNITY): Payer: Self-pay

## 2023-06-07 ENCOUNTER — Other Ambulatory Visit: Payer: Self-pay

## 2023-06-07 ENCOUNTER — Emergency Department (HOSPITAL_COMMUNITY)
Admission: EM | Admit: 2023-06-07 | Discharge: 2023-06-07 | Disposition: A | Discharge status: Home / Self Care | Payer: Medicare (Managed Care) | Attending: Emergency Medicine | Admitting: Emergency Medicine

## 2023-06-07 ENCOUNTER — Emergency Department (HOSPITAL_COMMUNITY): Payer: Medicare (Managed Care)

## 2023-06-07 DIAGNOSIS — I700 Atherosclerosis of aorta: Secondary | ICD-10-CM | POA: Insufficient documentation

## 2023-06-07 DIAGNOSIS — N200 Calculus of kidney: Secondary | ICD-10-CM | POA: Insufficient documentation

## 2023-06-07 DIAGNOSIS — I10 Essential (primary) hypertension: Secondary | ICD-10-CM | POA: Diagnosis not present

## 2023-06-07 DIAGNOSIS — R4182 Altered mental status, unspecified: Secondary | ICD-10-CM | POA: Insufficient documentation

## 2023-06-07 DIAGNOSIS — K766 Portal hypertension: Secondary | ICD-10-CM | POA: Diagnosis not present

## 2023-06-07 DIAGNOSIS — K7460 Unspecified cirrhosis of liver: Secondary | ICD-10-CM | POA: Insufficient documentation

## 2023-06-07 DIAGNOSIS — R161 Splenomegaly, not elsewhere classified: Secondary | ICD-10-CM | POA: Diagnosis not present

## 2023-06-07 LAB — URINALYSIS, ROUTINE W REFLEX MICROSCOPIC
Bilirubin Urine: NEGATIVE
Glucose, UA: NEGATIVE mg/dL
Hgb urine dipstick: NEGATIVE
Ketones, ur: NEGATIVE mg/dL
Nitrite: NEGATIVE
Protein, ur: NEGATIVE mg/dL
Specific Gravity, Urine: 1.018 (ref 1.005–1.030)
pH: 5 (ref 5.0–8.0)

## 2023-06-07 LAB — CBC WITH DIFFERENTIAL/PLATELET
Abs Immature Granulocytes: 0.02 10*3/uL (ref 0.00–0.07)
Basophils Absolute: 0 10*3/uL (ref 0.0–0.1)
Basophils Relative: 1 %
Eosinophils Absolute: 0.1 10*3/uL (ref 0.0–0.5)
Eosinophils Relative: 2 %
HCT: 34.4 % — ABNORMAL LOW (ref 36.0–46.0)
Hemoglobin: 10.6 g/dL — ABNORMAL LOW (ref 12.0–15.0)
Immature Granulocytes: 0 %
Lymphocytes Relative: 7 %
Lymphs Abs: 0.4 10*3/uL — ABNORMAL LOW (ref 0.7–4.0)
MCH: 29.6 pg (ref 26.0–34.0)
MCHC: 30.8 g/dL (ref 30.0–36.0)
MCV: 96.1 fL (ref 80.0–100.0)
Monocytes Absolute: 0.4 10*3/uL (ref 0.1–1.0)
Monocytes Relative: 7 %
Neutro Abs: 5 10*3/uL (ref 1.7–7.7)
Neutrophils Relative %: 83 %
Platelets: 105 10*3/uL — ABNORMAL LOW (ref 150–400)
RBC: 3.58 MIL/uL — ABNORMAL LOW (ref 3.87–5.11)
RDW: 16.6 % — ABNORMAL HIGH (ref 11.5–15.5)
WBC: 6 10*3/uL (ref 4.0–10.5)
nRBC: 0 % (ref 0.0–0.2)

## 2023-06-07 LAB — COMPREHENSIVE METABOLIC PANEL WITH GFR
ALT: 19 U/L (ref 0–44)
AST: 30 U/L (ref 15–41)
Albumin: 2.7 g/dL — ABNORMAL LOW (ref 3.5–5.0)
Alkaline Phosphatase: 100 U/L (ref 38–126)
Anion gap: 7 (ref 5–15)
BUN: 21 mg/dL (ref 8–23)
CO2: 18 mmol/L — ABNORMAL LOW (ref 22–32)
Calcium: 8.2 mg/dL — ABNORMAL LOW (ref 8.9–10.3)
Chloride: 111 mmol/L (ref 98–111)
Creatinine, Ser: 1.16 mg/dL — ABNORMAL HIGH (ref 0.44–1.00)
GFR, Estimated: 52 mL/min — ABNORMAL LOW (ref >60)
Glucose, Bld: 96 mg/dL (ref 70–99)
Potassium: 3.4 mmol/L — ABNORMAL LOW (ref 3.5–5.1)
Sodium: 136 mmol/L (ref 135–145)
Total Bilirubin: 2 mg/dL — ABNORMAL HIGH (ref 0.0–1.2)
Total Protein: 6 g/dL — ABNORMAL LOW (ref 6.5–8.1)

## 2023-06-07 LAB — TROPONIN I (HIGH SENSITIVITY): Troponin I (High Sensitivity): 38 ng/L — ABNORMAL HIGH (ref <18)

## 2023-06-07 LAB — AMMONIA: Ammonia: 47 umol/L — ABNORMAL HIGH (ref 9–35)

## 2023-06-07 LAB — I-STAT CG4 LACTIC ACID, ED
Lactic Acid, Venous: 1.8 mmol/L (ref 0.5–1.9)
Lactic Acid, Venous: 1.8 mmol/L (ref 0.5–1.9)

## 2023-06-07 MED ORDER — MORPHINE SULFATE (PF) 4 MG/ML IV SOLN
4.0000 mg | Freq: Once | INTRAVENOUS | Status: AC
  Administered 2023-06-07: 4 mg via INTRAVENOUS
  Filled 2023-06-07: qty 1

## 2023-06-07 MED ORDER — ACETAMINOPHEN 325 MG PO TABS
325.0000 mg | ORAL_TABLET | Freq: Four times a day (QID) | ORAL | Status: DC | PRN
  Administered 2023-06-07: 325 mg via ORAL
  Filled 2023-06-07: qty 1

## 2023-06-07 MED ORDER — DIPHENHYDRAMINE HCL 50 MG/ML IJ SOLN
25.0000 mg | Freq: Once | INTRAMUSCULAR | Status: AC
  Administered 2023-06-07: 25 mg via INTRAVENOUS
  Filled 2023-06-07: qty 1

## 2023-06-07 MED ORDER — LACTATED RINGERS IV BOLUS
1000.0000 mL | Freq: Once | INTRAVENOUS | Status: AC
  Administered 2023-06-07: 1000 mL via INTRAVENOUS

## 2023-06-07 NOTE — ED Provider Notes (Signed)
 [place] EMERGENCY DEPARTMENT AT [facility] Provider Note   CSN: [id number] Arrival date & time: 06/07/23  0055     History Chief Complaint  Patient presents with   Altered Mental Status    HPI [name] is a 68 y.o. female presenting for AMS/Vomitting. Had been doing well all day and when the family member ordered dinner he noticed that she was confused and started vomitting Hx of HE presenting similarly.   Patient's recorded medical, surgical, social, medication list and allergies were reviewed in the Snapshot window as part of the initial history.   Review of Systems   Review of Systems  Constitutional:  Negative for chills and fever.  HENT:  Negative for ear pain and sore throat.   Eyes:  Negative for pain and visual disturbance.  Respiratory:  Negative for cough and shortness of breath.   Cardiovascular:  Negative for chest pain and palpitations.  Gastrointestinal:  Positive for nausea and vomiting. Negative for abdominal pain.  Genitourinary:  Negative for dysuria and hematuria.  Musculoskeletal:  Negative for arthralgias and back pain.  Skin:  Negative for color change and rash.  Neurological:  Negative for seizures and syncope.  Psychiatric/Behavioral:  Positive for confusion.   All other systems reviewed and are negative.   Physical Exam Updated Vital Signs BP (!) 112/55   Pulse 87   Temp 99.5 F (37.5 C) (Oral)   Resp (!) 22   SpO2 98%  Physical Exam Vitals and nursing note reviewed.  Constitutional:      General: She is not in acute distress.    Appearance: She is well-developed.  HENT:     Head: Normocephalic and atraumatic.  Eyes:     Conjunctiva/sclera: Conjunctivae normal.  Cardiovascular:     Rate and Rhythm: Normal rate and regular rhythm.     Heart sounds: No murmur heard. Pulmonary:     Effort: Pulmonary effort is normal. No respiratory distress.     Breath sounds: Normal breath sounds.  Abdominal:     General: There  is no distension.     Palpations: Abdomen is soft.     Tenderness: There is no abdominal tenderness. There is no right CVA tenderness or left CVA tenderness.  Musculoskeletal:        General: No swelling or tenderness. Normal range of motion.     Cervical back: Neck supple.  Skin:    General: Skin is warm and dry.  Neurological:     General: No focal deficit present.     Mental Status: She is alert and oriented to person, place, and time. Mental status is at baseline.     Cranial Nerves: No cranial nerve deficit.      ED Course/ Medical Decision Making/ A&P    Procedures Procedures   Medications Ordered in ED Medications  acetaminophen  (TYLENOL ) tablet 325 mg (325 mg Oral Given 06/07/23 0226)  lactated ringers  bolus 1,000 mL (0 mLs Intravenous Stopped 06/07/23 0423)  morphine  (PF) 4 MG/ML injection 4 mg (4 mg Intravenous Given 06/07/23 0217)  diphenhydrAMINE  (BENADRYL ) injection 25 mg (25 mg Intravenous Given 06/07/23 0423)   Medical Decision Making:   [name] is a 68 y.o. female who presented to the ED today with altered mental status detailed above.    Patient placed on continuous vitals and telemetry monitoring while in ED which was reviewed periodically.  Complete initial physical exam performed, notably the patient  was HDS in NAD.    Reviewed and confirmed  nursing documentation for past medical history, family history, social history.    Initial Assessment:   With the patient's presentation of altered mental status, most likely diagnosis is delerium 2/2 infectious etiology (UTI/CAP/URI) vs metabolic abnormality (Na/K/Mg/Ca) vs nonspecific etiology. Other diagnoses were considered including (but not limited to) CVA, ICH, intracranial mass, critical dehydration, heptatic dysfunction, uremia, hypercarbia, intoxication, endrocrine abnormality, toxidrome. These are considered less likely due to history of present illness and physical exam findings.   This is most consistent  with an acute life/limb threatening illness complicated by underlying chronic conditions.  Initial Plan:  CTH to evaluate for intracranial etiology of patient's symptoms  Screening labs including CBC and Metabolic panel to evaluate for infectious or metabolic etiology of disease.  Urinalysis with reflex culture ordered to evaluate for UTI or relevant urologic/nephrologic pathology.  Given patient's reported nausea vomiting, CT abdomen pelvis to evaluate for intra-abdominal etiology of her symptoms EKG to evaluate for cardiac pathology Objective evaluation as below reviewed   Initial Study Results:   Laboratory  All laboratory results reviewed without evidence of clinically relevant pathology.   EKG EKG was reviewed independently. Rate, rhythm, axis, intervals all examined and without medically relevant abnormality. ST segments without concerns for elevations.    Radiology:  All images reviewed independently. Agree with radiology report at this time.   CT ABDOMEN PELVIS WO CONTRAST Result Date: 06/07/2023 CLINICAL DATA:  Acute nonlocalized abdominal pain. Lethargy and confusion. Vomiting. Cirrhosis. EXAM: CT ABDOMEN AND PELVIS WITHOUT CONTRAST TECHNIQUE: Multidetector CT imaging of the abdomen and pelvis was performed following the standard protocol without IV contrast. RADIATION DOSE REDUCTION: This exam was performed according to the departmental dose-optimization program which includes automated exposure control, adjustment of the mA and/or kV according to patient size and/or use of iterative reconstruction technique. COMPARISON:  CT abdomen pelvis 05/03/2023 FINDINGS: Lower chest: No acute abnormality. Hepatobiliary: Nodular hepatic contour compatible with cirrhosis. Cholecystectomy. No biliary dilation. Pancreas: Unremarkable. Spleen: No similar splenomegaly. Adrenals/Urinary Tract: Normal adrenal glands. Punctate nonobstructing right nephrolithiasis. No urinary calculi or hydronephrosis.  Unremarkable bladder. Stomach/Bowel: Normal caliber large and small bowel. Postoperative change about the right colon. 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 Lipoma within the wall of the transverse colon. The appendix is not visualized. Stomach within normal limits. Vascular/Lymphatic: Aortic atherosclerotic calcification. Prominent retroperitoneal varices. No lymphadenopathy. Reproductive: Calcified uterine fibroids.  No adnexal mass. Other: Mesenteric edema likely due to portal venous congestion. No abdominopelvic ascites. No free intraperitoneal air. Infrarenal left paracentral anterior abdominal wall fat and fluid containing hernia. Ventral abdominal wall hernia repair mesh. Musculoskeletal: No acute fracture. Chronic compression fracture of T12 status post vertebroplasty. IMPRESSION: 1. No acute abnormality in the abdomen or pelvis. 2. Cirrhosis with sequela of portal hypertension including splenomegaly and prominent retroperitoneal varices. 3. 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 Colitis could appear similarly. 4. Punctate nonobstructing right nephrolithiasis. 5. Aortic Atherosclerosis (ICD10-I70.0). Electronically Signed   By: Rozell Cornet M.D.   On: 06/07/2023 03:14   CT HEAD WO CONTRAST ( ) Result Date: 06/07/2023 CLINICAL DATA:  Altered mental status EXAM: CT HEAD WITHOUT CONTRAST TECHNIQUE: Contiguous axial images were obtained from the base of the skull through the vertex without intravenous contrast. RADIATION DOSE REDUCTION: This exam was performed according to the departmental dose-optimization program which includes automated exposure control, adjustment of the mA and/or kV according to patient size and/or use of iterative reconstruction technique. COMPARISON:  05/22/2023 FINDINGS: Brain: There is no mass, hemorrhage or extra-axial collection. The size and  configuration of the ventricles and extra-axial CSF spaces are normal. There is hypoattenuation of the white matter, most commonly indicating chronic small vessel disease. Vascular: Atherosclerotic calcification of the vertebral and internal carotid arteries at the skull base. No abnormal hyperdensity of the major intracranial arteries or dural venous sinuses. Skull: The visualized skull base, calvarium and extracranial soft tissues are normal. Sinuses/Orbits: No fluid levels or advanced mucosal thickening of the visualized paranasal sinuses. No mastoid or middle ear effusion. Normal orbits. Other: None. IMPRESSION: 1. No acute intracranial abnormality. 2. Chronic small vessel disease. Electronically Signed   By: Juanetta Nordmann M.D.   On: 06/07/2023 03:04   DG Abd 1 View Result Date: 05/27/2023 CLINICAL DATA:  Abdominal pain. EXAM: ABDOMEN - 1 VIEW COMPARISON:  CT 05/03/2023 FINDINGS: Uppermost abdomen and lower most pelvis are not included in the field of view. No bowel dilatation to suggest obstruction. Moderate stool within the transverse and descending colon. Enteric sutures in the right abdomen. Calcified uterine fibroids in the pelvis. Cholecystectomy clips in the right upper quadrant. Abdominal vascular calcifications are seen. IMPRESSION: Nonobstructive bowel gas pattern. Moderate stool in the transverse and descending colon. Electronically Signed   By: Chadwick Colonel M.D.   On: 05/27/2023 12:33   CT HEAD WO CONTRAST ( ) Result Date: 05/22/2023 CLINICAL DATA:  Altered mental status EXAM: CT HEAD WITHOUT CONTRAST TECHNIQUE: Contiguous axial images were obtained from the base of the skull through the vertex without intravenous contrast. RADIATION DOSE REDUCTION: This exam was performed according to the departmental dose-optimization program which includes automated exposure control, adjustment of the mA and/or kV according to patient size and/or use of iterative reconstruction technique. COMPARISON:   05/03/2023 FINDINGS: Brain: No evidence of acute infarction, hemorrhage, hydrocephalus, extra-axial collection or mass lesion/mass effect. Chronic white matter ischemic changes are noted stable from the prior exam. Vascular: No hyperdense vessel or unexpected calcification. Skull: Normal. Negative for fracture or focal lesion. Sinuses/Orbits: No acute finding. Other: None. IMPRESSION: Chronic white matter ischemic changes.  No acute abnormality noted. Electronically Signed   By: Violeta Grey M.D.   On: 05/22/2023 20:43   DG Chest 2 View Result Date: 05/22/2023 CLINICAL DATA:  Altered mental status with nausea EXAM: CHEST - 2 VIEW COMPARISON:  05/03/2023 FINDINGS: Cardiac shadow is mildly prominent but accentuated by the frontal technique. Aortic calcifications are seen. The lungs are clear bilaterally. No effusion is seen. Changes of prior vertebral augmentation in the lower thoracic spine are noted. IMPRESSION: No active cardiopulmonary disease. Electronically Signed   By: Violeta Grey M.D.   On: 05/22/2023 20:23    Final Assessment and Plan:   Patient observed in the emergency room for 4-1/2 hours.  Her mental status returned to baseline, vital signs remained within normal limits.  Lab work returned grossly consistent with prior.  Ammonia level is appropriately down from prior discharge, kidney function within margin of error from prior.  She is in no acute distress.  Has been able to tolerate p.o. intake and is resting comfortably at this point. Overall I believe at this time patient is stable for observational care with plan to be reassessed by PCP within 48 hours for any new symptoms with strict return precautions reinforced. No focal pathology on exam patient clinically improved feels comfortable with outpatient care management.  Family member called and updated on the plan of care.  Disposition:  I have considered need  for hospitalization, however, considering all of the above, I believe this  patient is stable for discharge at this time.  Patient/family educated about specific return precautions for given chief complaint and symptoms.  Patient/family educated about follow-up with PCP.     Patient/family expressed understanding of return precautions and need for follow-up. Patient spoken to regarding all imaging and laboratory results and appropriate follow up for these results. All education provided in verbal form with additional information in written form. Time was allowed for answering of patient questions. Patient discharged.    Emergency Department Medication Summary:   Medications  acetaminophen  (TYLENOL ) tablet 325 mg (325 mg Oral Given 06/07/23 0226)  lactated ringers  bolus 1,000 mL (0 mLs Intravenous Stopped 06/07/23 0423)  morphine  (PF) 4 MG/ML injection 4 mg (4 mg Intravenous Given 06/07/23 0217)  diphenhydrAMINE  (BENADRYL ) injection 25 mg (25 mg Intravenous Given 06/07/23 0423)    Clinical Impression:  1. Altered mental status, unspecified altered mental status type      Discharge   Final Clinical Impression(s) / ED Diagnoses Final diagnoses:  Altered mental status, unspecified altered mental status type    Rx / DC Orders ED Discharge Orders     None         Onetha Bile, MD 06/07/23 [phone]

## 2023-06-07 NOTE — ED Triage Notes (Signed)
 Pt's son reports that pt became more lethargic and confused x 2 hours ago. Pt has also had some vomiting. Pt has cirrhosis and son states that she gets like this when her ammonia level is elevated

## 2023-06-09 ENCOUNTER — Other Ambulatory Visit: Payer: Self-pay | Admitting: Family Medicine

## 2023-06-09 ENCOUNTER — Other Ambulatory Visit: Payer: Self-pay

## 2023-06-09 ENCOUNTER — Encounter: Payer: Self-pay | Admitting: Family Medicine

## 2023-06-09 ENCOUNTER — Other Ambulatory Visit: Payer: Medicare (Managed Care) | Admitting: Licensed Clinical Social Worker

## 2023-06-09 DIAGNOSIS — K219 Gastro-esophageal reflux disease without esophagitis: Secondary | ICD-10-CM

## 2023-06-09 MED ORDER — PANTOPRAZOLE SODIUM 40 MG PO TBEC: 40.0000 mg | DELAYED_RELEASE_TABLET | Freq: Two times a day (BID) | ORAL | 2 refills | Status: DC

## 2023-06-09 NOTE — Patient Outreach (Signed)
 Complex Care Management   Visit Note  06/09/2023  Name:  [name] MRN: [id number] DOB: 11/30/1955  Situation: Referral received for Complex Care Management related to Traditional Medicaid I obtained verbal consent from Caregiver Patient.  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   Hyperammonemia (HCC) 09/30/2022   Hypertension    Kidney stone    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   Obesity    Pulmonary emboli (HCC) 01/10/2009   Pulmonary embolism (HCC)    Short gut syndrome    Thyroid  disease     Assessment: Patient is still having difficulty understanding the traditional Medicaid process and is waiting for the Medicaid worker to call    SDOH Interventions    Flowsheet Row Patient Outreach from 06/09/2023 in [place] [facility] DEPART[facility] Telephone from 04/17/2023 in [place] [facility] DEPART[facility] ED to Hosp-Admission (Discharged) from 12/10/2022 in [place] [facility] PROGRESSIVE CARE AND UROLOGY  SDOH Interventions     Food Insecurity Interventions Intervention Not Indicated Intervention Not Indicated Intervention Not Indicated, Inpatient TOC  Housing Interventions Intervention Not Indicated Intervention Not Indicated Intervention Not Indicated, Inpatient TOC  Transportation Interventions Intervention Not Indicated Intervention Not Indicated, Patient Resources  (Friends/Family) Intervention Not Indicated, Inpatient TOC  Utilities Interventions Intervention Not Indicated Intervention Not Indicated Intervention Not Indicated, Inpatient TOC  Financial Strain Interventions Intervention Not Indicated -- --         Recommendation:   None  Follow Up Plan:   Telephone follow up appointment date/time:  06/23/2023 at 10:30 am  Jonda Neighbours, PhD [facility] Social Worker Direct Dial: [phone]  Fax: [phone]

## 2023-06-09 NOTE — Patient Instructions (Signed)
 Visit Information  Thank you for taking time to visit with me today. Please don't hesitate to contact me if I can be of assistance to you before our next scheduled appointment.  Your next care management appointment is by telephone on 06/23/2023 at 10:30 am   Please call the care guide team at [phone] if you need to cancel, schedule, or reschedule an appointment.   Please call the Suicide and Crisis Lifeline: 988 go to [facility] [address] [place] [phone]) call 911 if you are experiencing a Mental Health or Behavioral Health Crisis or need someone to talk to.  Jonda Neighbours, PhD [facility], [facility] Social Worker Direct Dial: [phone]  Fax: [phone]

## 2023-06-10 NOTE — Telephone Encounter (Signed)
 Requested Prescriptions  Pending Prescriptions Disp Refills   rOPINIRole  (REQUIP ) 0.25 MG tablet [Pharmacy Med Name: ROPINIROLE  0.25MG  TABLETS] 90 tablet 1    Sig: TAKE 1 TABLET(0.25 MG) BY MOUTH AT BEDTIME     Neurology:  Parkinsonian Agents Failed - 06/10/2023  5:12 PM      Failed - Valid encounter within last 12 months    Recent Outpatient Visits           1 month ago Edema of both lower legs   [place] [facility] Amadeo June, MD   3 months ago Pruritus   [facility] Amadeo June, MD              Passed - Last BP in normal range    BP Readings from Last 1 Encounters:  06/07/23 107/60         Passed - Last Heart Rate in normal range    Pulse Readings from Last 1 Encounters:  06/07/23 93

## 2023-06-15 ENCOUNTER — Ambulatory Visit: Payer: Medicare (Managed Care) | Admitting: Obstetrics and Gynecology

## 2023-06-19 DIAGNOSIS — E538 Deficiency of other specified B group vitamins: Secondary | ICD-10-CM | POA: Diagnosis not present

## 2023-06-19 DIAGNOSIS — D508 Other iron deficiency anemias: Secondary | ICD-10-CM | POA: Diagnosis not present

## 2023-06-20 ENCOUNTER — Other Ambulatory Visit: Payer: Self-pay

## 2023-06-20 ENCOUNTER — Telehealth: Payer: Self-pay | Admitting: Family Medicine

## 2023-06-20 MED ORDER — SPIRONOLACTONE 25 MG PO TABS: 25.0000 mg | ORAL_TABLET | Freq: Every day | ORAL | 0 refills | Status: DC

## 2023-06-20 NOTE — Telephone Encounter (Signed)
 Received CRM regarding a call from [employer]. They called on behalf of [employer] to confirm receipt of a fax for this patient. Callback: [phone] Ref# [id number]   Outbound call placed to [employer]; spoke with Trula Gable. Faxes originally sent on 5/23 an 5/27 to our old fax number. I gave her our current fax number; stated she will send out ROI which is only requesting labs for specific dates of service.

## 2023-06-23 ENCOUNTER — Other Ambulatory Visit: Payer: Medicare (Managed Care) | Admitting: Licensed Clinical Social Worker

## 2023-06-23 ENCOUNTER — Other Ambulatory Visit: Payer: Self-pay | Admitting: Family Medicine

## 2023-06-26 ENCOUNTER — Encounter (HOSPITAL_COMMUNITY): Payer: Self-pay

## 2023-06-26 ENCOUNTER — Emergency Department (HOSPITAL_COMMUNITY): Payer: Medicare (Managed Care)

## 2023-06-26 ENCOUNTER — Other Ambulatory Visit: Payer: Self-pay

## 2023-06-26 ENCOUNTER — Emergency Department (HOSPITAL_COMMUNITY)
Admission: EM | Admit: 2023-06-26 | Discharge: 2023-06-26 | Disposition: A | Discharge status: Home / Self Care | Payer: Medicare (Managed Care) | Source: Home / Self Care | Attending: Emergency Medicine | Admitting: Emergency Medicine

## 2023-06-26 ENCOUNTER — Telehealth: Payer: Self-pay

## 2023-06-26 DIAGNOSIS — E7220 Disorder of urea cycle metabolism, unspecified: Secondary | ICD-10-CM | POA: Insufficient documentation

## 2023-06-26 DIAGNOSIS — K7581 Nonalcoholic steatohepatitis (NASH): Secondary | ICD-10-CM | POA: Diagnosis present

## 2023-06-26 DIAGNOSIS — G8929 Other chronic pain: Secondary | ICD-10-CM | POA: Diagnosis present

## 2023-06-26 DIAGNOSIS — M79661 Pain in right lower leg: Secondary | ICD-10-CM | POA: Diagnosis not present

## 2023-06-26 DIAGNOSIS — D631 Anemia in chronic kidney disease: Secondary | ICD-10-CM | POA: Diagnosis present

## 2023-06-26 DIAGNOSIS — Z880 Allergy status to penicillin: Secondary | ICD-10-CM | POA: Diagnosis not present

## 2023-06-26 DIAGNOSIS — F411 Generalized anxiety disorder: Secondary | ICD-10-CM | POA: Diagnosis present

## 2023-06-26 DIAGNOSIS — R161 Splenomegaly, not elsewhere classified: Secondary | ICD-10-CM | POA: Diagnosis present

## 2023-06-26 DIAGNOSIS — J9811 Atelectasis: Secondary | ICD-10-CM | POA: Diagnosis present

## 2023-06-26 DIAGNOSIS — I10 Essential (primary) hypertension: Secondary | ICD-10-CM | POA: Diagnosis not present

## 2023-06-26 DIAGNOSIS — Z66 Do not resuscitate: Secondary | ICD-10-CM | POA: Diagnosis present

## 2023-06-26 DIAGNOSIS — K90829 Short bowel syndrome, unspecified: Secondary | ICD-10-CM | POA: Diagnosis present

## 2023-06-26 DIAGNOSIS — E039 Hypothyroidism, unspecified: Secondary | ICD-10-CM | POA: Diagnosis present

## 2023-06-26 DIAGNOSIS — D509 Iron deficiency anemia, unspecified: Secondary | ICD-10-CM | POA: Diagnosis present

## 2023-06-26 DIAGNOSIS — G2581 Restless legs syndrome: Secondary | ICD-10-CM | POA: Diagnosis present

## 2023-06-26 DIAGNOSIS — E669 Obesity, unspecified: Secondary | ICD-10-CM | POA: Diagnosis present

## 2023-06-26 DIAGNOSIS — K7460 Unspecified cirrhosis of liver: Secondary | ICD-10-CM | POA: Diagnosis present

## 2023-06-26 DIAGNOSIS — Z888 Allergy status to other drugs, medicaments and biological substances status: Secondary | ICD-10-CM | POA: Diagnosis not present

## 2023-06-26 DIAGNOSIS — N200 Calculus of kidney: Secondary | ICD-10-CM | POA: Diagnosis present

## 2023-06-26 DIAGNOSIS — R9431 Abnormal electrocardiogram [ECG] [EKG]: Secondary | ICD-10-CM | POA: Diagnosis not present

## 2023-06-26 DIAGNOSIS — R410 Disorientation, unspecified: Secondary | ICD-10-CM | POA: Diagnosis not present

## 2023-06-26 DIAGNOSIS — I672 Cerebral atherosclerosis: Secondary | ICD-10-CM | POA: Diagnosis present

## 2023-06-26 DIAGNOSIS — N1831 Chronic kidney disease, stage 3a: Secondary | ICD-10-CM | POA: Diagnosis present

## 2023-06-26 DIAGNOSIS — K7682 Hepatic encephalopathy: Secondary | ICD-10-CM | POA: Diagnosis present

## 2023-06-26 DIAGNOSIS — I129 Hypertensive chronic kidney disease with stage 1 through stage 4 chronic kidney disease, or unspecified chronic kidney disease: Secondary | ICD-10-CM | POA: Diagnosis present

## 2023-06-26 DIAGNOSIS — K766 Portal hypertension: Secondary | ICD-10-CM | POA: Diagnosis present

## 2023-06-26 DIAGNOSIS — H5462 Unqualified visual loss, left eye, normal vision right eye: Secondary | ICD-10-CM | POA: Diagnosis present

## 2023-06-26 DIAGNOSIS — E785 Hyperlipidemia, unspecified: Secondary | ICD-10-CM | POA: Diagnosis present

## 2023-06-26 DIAGNOSIS — K529 Noninfective gastroenteritis and colitis, unspecified: Secondary | ICD-10-CM | POA: Diagnosis present

## 2023-06-26 LAB — CBC
HCT: 34.7 % — ABNORMAL LOW (ref 36.0–46.0)
Hemoglobin: 10.5 g/dL — ABNORMAL LOW (ref 12.0–15.0)
MCH: 29.4 pg (ref 26.0–34.0)
MCHC: 30.3 g/dL (ref 30.0–36.0)
MCV: 97.2 fL (ref 80.0–100.0)
Platelets: 192 10*3/uL (ref 150–400)
RBC: 3.57 MIL/uL — ABNORMAL LOW (ref 3.87–5.11)
RDW: 16.2 % — ABNORMAL HIGH (ref 11.5–15.5)
WBC: 5.4 10*3/uL (ref 4.0–10.5)
nRBC: 0 % (ref 0.0–0.2)

## 2023-06-26 LAB — URINALYSIS, ROUTINE W REFLEX MICROSCOPIC
Bilirubin Urine: NEGATIVE
Glucose, UA: NEGATIVE mg/dL
Hgb urine dipstick: NEGATIVE
Ketones, ur: NEGATIVE mg/dL
Leukocytes,Ua: NEGATIVE
Nitrite: NEGATIVE
Protein, ur: NEGATIVE mg/dL
Specific Gravity, Urine: 1.008 (ref 1.005–1.030)
pH: 5 (ref 5.0–8.0)

## 2023-06-26 LAB — COMPREHENSIVE METABOLIC PANEL WITH GFR
ALT: 25 U/L (ref 0–44)
AST: 31 U/L (ref 15–41)
Albumin: 2.6 g/dL — ABNORMAL LOW (ref 3.5–5.0)
Alkaline Phosphatase: 111 U/L (ref 38–126)
Anion gap: 8 (ref 5–15)
BUN: 25 mg/dL — ABNORMAL HIGH (ref 8–23)
CO2: 22 mmol/L (ref 22–32)
Calcium: 8.3 mg/dL — ABNORMAL LOW (ref 8.9–10.3)
Chloride: 111 mmol/L (ref 98–111)
Creatinine, Ser: 1.26 mg/dL — ABNORMAL HIGH (ref 0.44–1.00)
GFR, Estimated: 47 mL/min — ABNORMAL LOW (ref >60)
Glucose, Bld: 130 mg/dL — ABNORMAL HIGH (ref 70–99)
Potassium: 3.7 mmol/L (ref 3.5–5.1)
Sodium: 141 mmol/L (ref 135–145)
Total Bilirubin: 1.7 mg/dL — ABNORMAL HIGH (ref 0.0–1.2)
Total Protein: 6.6 g/dL (ref 6.5–8.1)

## 2023-06-26 LAB — AMMONIA: Ammonia: 98 umol/L — ABNORMAL HIGH (ref 9–35)

## 2023-06-26 LAB — PROTIME-INR
INR: 1.5 — ABNORMAL HIGH (ref 0.8–1.2)
Prothrombin Time: 18.3 s — ABNORMAL HIGH (ref 11.4–15.2)

## 2023-06-26 MED ORDER — LEVOTHYROXINE SODIUM 175 MCG PO TABS: 175.0000 ug | ORAL_TABLET | Freq: Every day | ORAL | 0 refills | Status: DC

## 2023-06-26 MED ORDER — SENNOSIDES-DOCUSATE SODIUM 8.6-50 MG PO TABS: 1.0000 | ORAL_TABLET | Freq: Two times a day (BID) | ORAL | 0 refills | Status: DC

## 2023-06-26 MED ORDER — SPIRONOLACTONE 50 MG PO TABS: 50.0000 mg | ORAL_TABLET | Freq: Every day | ORAL | 0 refills | Status: DC

## 2023-06-26 NOTE — Telephone Encounter (Signed)
 Prescription Request  06/26/2023  LOV: 04/24/23  What is the name of the medication or equipment? midodrine  (PROAMATINE ) 10 MG tablet [[id number]]   Have you contacted your pharmacy to request a refill? Yes   Which pharmacy would you like this sent to?  [id number] - [place], [place] - [address] AT [facility] [address] [place] [address] Phone: [phone] Fax: [phone]    Patient notified that their request is being sent to the clinical staff for review and that they should receive a response within 2 business days.   Please advise at [facility] [phone]

## 2023-06-26 NOTE — Discharge Instructions (Addendum)
 I recommend that you increase the lactulose  dose to 3 times a day for the next 2 days to help lower the ammonia bit.  If there is worsening confusion please bring your mother back to the hospital.  I did increase your spironolactone  dosing to 50 mg a day, as we discussed, but you need to keep a close eye on her blood pressure at home.  If her blood pressure systolic (top number) is dropping below 100 mmhg, or she is having dizzy spells, please reduce her dose back to 50 mg spironolactone .

## 2023-06-26 NOTE — ED Provider Notes (Signed)
 [place] EMERGENCY DEPARTMENT AT [facility] Provider Note   CSN: [id number] Arrival date & time: 06/26/23  1358     Patient presents with: Altered Mental Status   [name] is a 68 y.o. 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  Per son reports the patient has been pooping regularly with her lactulose  at home and her stool color is unchanged.  He was concerned because she is more confused than normal today.  He says that she slept poorly last night calmly a few hours.  He feels that her mental status is improving since she woke up this morning, she is able to recollect memory words that he gave her, 5 out of 5 words.  She also performed this in front of me.  He says he was concerned because her plan to take a trip out of town in a few days and wanted to make sure everything was okay.  The patient has no acute complaints for me  I reviewed her external records and the patient is admitted to the hospital several times for encephalopathy.  Most recently this occurred about 1 month ago in May.  She was started on lactulose  at the time.  She was also treated for possible UTI with antibiotics and discharged on Keflex .  Her son reports that she has iron  deficiency anemia and they are planning to start iron  infusions this week with their hematologist   HPI     Prior to Admission medications   Medication Sig Start Date End Date Taking? Authorizing Provider  spironolactone  (ALDACTONE ) 50 MG tablet Take 1 tablet (50 mg total) by mouth daily. 06/26/23 07/26/23 Yes Asaph Serena, Janalyn Me, MD  albuterol  (VENTOLIN  HFA) 108 [phone] Base) MCG/ACT inhaler Inhale 2 puffs into the lungs every 6 (six) hours as needed for shortness of breath or wheezing. 02/13/23   [provider]   atorvastatin  (LIPITOR) 40 MG tablet Take 1 tablet (40 mg total) by mouth in the morning. 02/22/23   Amadeo June, MD  copper  tablet Take 2 mg by mouth in the morning and at bedtime.    [provider]  cyanocobalamin  (VITAMIN B12) 1000 MCG tablet Take 2,000 mcg by mouth every Monday, Wednesday, and Friday.    [provider]  dicyclomine  (BENTYL ) 10 MG capsule Take 1 capsule (10 mg total) by mouth 3 (three) times daily as needed for spasms (abdominal cramping). 09/09/22   Faith Homes, MD  famotidine  (PEPCID ) 20 MG tablet Take 20 mg by mouth in the morning.    [provider]  folic acid  (FOLVITE ) 1 MG tablet Take 1 tablet (1 mg total) by mouth daily. 06/16/22   Rizwan, [name], MD  furosemide  (LASIX ) 40 MG tablet Take 0.5 tablets (20 mg total) by mouth daily. 05/29/23   Audria Leather, MD  HYDROcodone -acetaminophen  (NORCO) 10-325 MG tablet Take 0.5 tablets by mouth every 12 (twelve) hours as needed for moderate pain (pain score 4-6). 05/29/23   Audria Leather, MD  IRON -VITAMIN C  PO Take 1 tablet by mouth daily with breakfast.    [provider]  lactulose  (CHRONULAC ) 10 GM/15ML solution Take 45 mLs (30 g total) by mouth 3 (three) times daily. Take to ensure that there is 2 loose bowel movement EVERY DAY 05/29/23   Audria Leather, MD  levothyroxine  (SYNTHROID ) 175 MCG tablet Take 1  tablet (175 mcg total) by mouth daily at 6 (six) AM. Recheck TSH with PCP in 4 weeks 06/26/23   Amadeo June, MD  lidocaine  (LIDODERM ) 5 % Place 1 patch onto the skin daily. Remove & Discard patch within 12 hours or as directed by MD Patient taking differently: Place 1 patch onto the skin daily as needed (for pain- Remove & Discard patch within 12 hours or as directed by MD). 10/31/22   Kraig Peru, MD  magnesium  gluconate (MAGONATE) 500 (27 Mg) MG TABS tablet Take 1 tablet (500 mg total) by mouth in the morning and at bedtime. Patient taking differently: Take 1,000 mg by mouth daily.  05/01/23   Amadeo June, MD  meclizine  (ANTIVERT ) 25 MG tablet Take 1 tablet (25 mg total) by mouth 3 (three) times daily as needed for dizziness. 05/29/23   Audria Leather, MD  megestrol  (MEGACE ) 40 MG tablet Take 1 tablet (40 mg total) by mouth 2 (two) times daily. 05/04/23   Ajewole, [name], MD  midodrine  (PROAMATINE ) 10 MG tablet TAKE 1 TABLET(10 MG) BY MOUTH THREE TIMES DAILY WITH MEALS 06/26/23   Amadeo June, MD  Multiple Vitamins-Minerals (CENTRUM SILVER ULTRA WOMENS PO) Take 1 tablet by mouth in the morning.    [provider]  naloxone [facility]) nasal spray 4 mg/0.1 mL Place 1 spray into the nose as needed (accidental overdose). 03/25/22   [provider]  nystatin  cream (MYCOSTATIN ) Apply 1 Application topically 2 (two) times daily as needed for dry skin. 05/29/23   Audria Leather, MD  pantoprazole  (PROTONIX ) 40 MG tablet Take 1 tablet (40 mg total) by mouth 2 (two) times daily before a meal. 06/09/23   Amadeo June, MD  polyethylene glycol (MIRALAX  / GLYCOLAX ) 17 g packet Take 17 g by mouth daily as needed for moderate constipation. 05/29/23   Audria Leather, MD  potassium chloride  (KLOR-CON ) 10 MEQ tablet Take 20 mEq by mouth 2 (two) times daily. 02/22/23   [provider]  rifaximin  (XIFAXAN ) 550 MG TABS tablet Take 550 mg by mouth 2 (two) times daily.    [provider]  rOPINIRole  (REQUIP ) 0.25 MG tablet TAKE 1 TABLET(0.25 MG) BY MOUTH AT BEDTIME 06/10/23   Amadeo June, MD  senna-docusate (SENOKOT-S) 8.6-50 MG tablet Take 1 tablet by mouth 2 (two) times daily. 06/26/23   Amadeo June, MD  spironolactone  (ALDACTONE ) 25 MG tablet Take 1 tablet (25 mg total) by mouth daily. 06/20/23   Amadeo June, MD  tamsulosin  (FLOMAX ) 0.4 MG CAPS capsule Take 1 capsule (0.4 mg total) by mouth daily. Patient not taking: Reported on 05/23/2023 09/10/22   Faith Homes, MD  [facility] ophthalmic ointment Place 1 Application into the left eye daily as needed.  05/02/23   [provider]  venlafaxine  XR (EFFEXOR -XR) 75 MG 24 hr capsule Take 1 capsule (75 mg total) by mouth daily with breakfast. 04/21/23   Amadeo June, MD  Vitamin D, Ergocalciferol, (DRISDOL) 50000 UNITS CAPS Take 50,000 Units by mouth every Wednesday.    [provider]    Allergies: Ace inhibitors, Ivp dye [iodinated contrast media], Desitin [zinc  oxide], Gadolinium derivatives, Hydroxyzine , Chlorhexidine , Doxycycline, Penicillins, and Zofran  [ondansetron ]    Review of Systems  Updated Vital Signs BP 127/71   Pulse 81   Temp 98.2 F (36.8 C) (Oral)   Resp 16   Ht 5' 2 (1.575 m)   Wt 96.6 kg   SpO2 100%   BMI 38.96 kg/m   Physical Exam Constitutional:  General: She is not in acute distress. HENT:     Head: Normocephalic and atraumatic.   Eyes:     Conjunctiva/sclera: Conjunctivae normal.     Pupils: Pupils are equal, round, and reactive to light.    Cardiovascular:     Rate and Rhythm: Normal rate and regular rhythm.  Pulmonary:     Effort: Pulmonary effort is normal. No respiratory distress.  Abdominal:     General: There is no distension.     Tenderness: There is no abdominal tenderness.   Skin:    General: Skin is warm and dry.   Neurological:     General: No focal deficit present.     Mental Status: She is alert. Mental status is at baseline.     (all labs ordered are listed, but only abnormal results are displayed) Labs Reviewed  COMPREHENSIVE METABOLIC PANEL WITH GFR - Abnormal; Notable for the following components:      Result Value   Glucose, Bld 130 (*)    BUN 25 (*)    Creatinine, Ser 1.26 (*)    Calcium  8.3 (*)    Albumin  2.6 (*)    Total Bilirubin 1.7 (*)    GFR, Estimated 47 (*)    All other components within normal limits  CBC - Abnormal; Notable for the following components:   RBC 3.57 (*)    Hemoglobin 10.5 (*)    HCT 34.7 (*)    RDW 16.2 (*)    All other components within normal limits  URINALYSIS,  ROUTINE W REFLEX MICROSCOPIC - Abnormal; Notable for the following components:   Color, Urine STRAW (*)    All other components within normal limits  AMMONIA - Abnormal; Notable for the following components:   Ammonia 98 (*)    All other components within normal limits  PROTIME-INR - Abnormal; Notable for the following components:   Prothrombin Time 18.3 (*)    INR 1.5 (*)    All other components within normal limits    EKG: EKG Interpretation Date/Time:  Monday June 26 2023 14:16:58 EDT Ventricular Rate:  73 PR Interval:  179 QRS Duration:  97 QT Interval:  409 QTC Calculation: 451 R Axis:   -16  Text Interpretation: Sinus rhythm Confirmed by Jerald Molly [id number]) on 06/26/2023 3:16:34 PM  Radiology: CT Head Wo Contrast Result Date: 06/26/2023 CLINICAL DATA:  Mental status change, unknown cause EXAM: CT HEAD WITHOUT CONTRAST TECHNIQUE: Contiguous axial images were obtained from the base of the skull through the vertex without intravenous contrast. RADIATION DOSE REDUCTION: This exam was performed according to the departmental dose-optimization program which includes automated exposure control, adjustment of the mA and/or kV according to patient size and/or use of iterative reconstruction technique. COMPARISON:  None Available. FINDINGS: Brain: The ventricles appear age appropriate. No mass effect or midline shift. Gray-white differentiation is preserved.Periventricular and subcortical white matter hypoattenuation, most consistent with changes of moderate chronic ischemic microvascular disease.No evidence of acute territorial infarction, extra-axial fluid collection, hemorrhage, or mass lesion. The basilar cisterns are patent without downward herniation. The cerebellar hemispheres and vermis are well formed without mass lesion or focal attenuation abnormality. Vascular: No hyperdense vessel. Calcified atherosclerotic plaque within the cavernous/supraclinoid ICA and intradural vertebral  arteries. Skull: Normal. Negative for fracture or focal lesion. Sinuses/Orbits: The paranasal sinuses and mastoids are clear.The globes appear intact. No retrobulbar hematoma. Other: None. IMPRESSION: 1. No acute intracranial abnormality, specifically, no acute hemorrhage, territorial infarction, or intracranial mass. 2. Sequelae of chronic ischemic microvascular  disease. Electronically Signed   By: Rance Burrows M.D.   On: 06/26/2023 17:30     Procedures   Medications Ordered in the ED - No data to display                                  Medical Decision Making Amount and/or Complexity of Data Reviewed Labs: ordered. Radiology: ordered.  Risk Prescription drug management.   This patient presents to the Emergency Department with complaint of altered mental status.  This involves an extensive number of treatment options, and is a complaint that carries with it a high risk of complications and morbidity.  The differential diagnosis includes hypoglycemia vs metabolic encephalopathy vs infection (including cystitis) vs ICH vs stroke vs polypharmacy vs other  I ordered, reviewed, and interpreted labs, including labs largely at recent baseline levels.  UA without sign of infection.  Ammonia is elevated at 98, but upon her prior hospitalization was 117, and was down to 97 at discharge.    Her son says her baseline ammonia is around 70, but it does wax and wane.  They had reduced her lactulose  to 2 times a day dosing, but I did recommend he increase it to 3 times a day at least for the next 2 days.  She is compliant with the rifaximin  as well.  Her son did request increasing her spironolactone  dose from 25 to 50 mg.  I think this is reasonable.  It been reduced due to issues with low blood pressure in the hospital but he reports that she was not given her midodrine  in the hospital, and her blood pressure has been in the 140s systolic at home, and better than normal.  They feel that the  spironolactone  helps with her overall health and with her edema.  I ordered imaging studies which included CT scan of her head I independently visualized and interpreted imaging which showed no emergent findings  Additional history was obtained from the patient's son at bedside Previous records obtained and reviewed showing  I personally reviewed the patients ECG which showed no acute ischemic findings  Overall felt the patient was clinically stable for discharge home.  She does not appear confused, but I do suspect that her poor sleep last night, coupled with her elevated ammonia levels, may be contributing to some of his waxing and waning confusion described.  She is in good hands in the care of her son, who appears quite attuned to her medication regimen, monitoring her vital signs, and titrating her lactulose  as needed.  Do-nothing she would benefit from rehospitalization.  They are comfortable with discharge      Final diagnoses:  Hyperammonemia [facility])    ED Discharge Orders          Ordered    spironolactone  (ALDACTONE ) 50 MG tablet  Daily        06/26/23 1739               Arvilla Birmingham, MD 06/26/23 1742

## 2023-06-26 NOTE — ED Triage Notes (Signed)
 Patient woke up today more confused than normal. More slow to do her tasks. Family reports patient went to sleep at 4am and only slept 2 hours. When she presents like this she has high ammonia or is sleep deprived. Alert and oriented to self, place, location.

## 2023-06-27 ENCOUNTER — Emergency Department (HOSPITAL_COMMUNITY)
Admission: EM | Admit: 2023-06-27 | Discharge: 2023-06-27 | Disposition: A | Discharge status: Home / Self Care | Payer: Medicare (Managed Care) | Source: Home / Self Care | Attending: Emergency Medicine | Admitting: Emergency Medicine

## 2023-06-27 ENCOUNTER — Other Ambulatory Visit: Payer: Self-pay

## 2023-06-27 DIAGNOSIS — R110 Nausea: Secondary | ICD-10-CM | POA: Insufficient documentation

## 2023-06-27 DIAGNOSIS — R410 Disorientation, unspecified: Secondary | ICD-10-CM | POA: Diagnosis not present

## 2023-06-27 DIAGNOSIS — R600 Localized edema: Secondary | ICD-10-CM | POA: Insufficient documentation

## 2023-06-27 DIAGNOSIS — R1084 Generalized abdominal pain: Secondary | ICD-10-CM | POA: Insufficient documentation

## 2023-06-27 LAB — COMPREHENSIVE METABOLIC PANEL WITH GFR
ALT: 25 U/L (ref 0–44)
AST: 31 U/L (ref 15–41)
Albumin: 2.7 g/dL — ABNORMAL LOW (ref 3.5–5.0)
Alkaline Phosphatase: 117 U/L (ref 38–126)
Anion gap: 7 (ref 5–15)
BUN: 24 mg/dL — ABNORMAL HIGH (ref 8–23)
CO2: 23 mmol/L (ref 22–32)
Calcium: 8.5 mg/dL — ABNORMAL LOW (ref 8.9–10.3)
Chloride: 109 mmol/L (ref 98–111)
Creatinine, Ser: 1.15 mg/dL — ABNORMAL HIGH (ref 0.44–1.00)
GFR, Estimated: 53 mL/min — ABNORMAL LOW (ref >60)
Glucose, Bld: 114 mg/dL — ABNORMAL HIGH (ref 70–99)
Potassium: 3.5 mmol/L (ref 3.5–5.1)
Sodium: 139 mmol/L (ref 135–145)
Total Bilirubin: 1.6 mg/dL — ABNORMAL HIGH (ref 0.0–1.2)
Total Protein: 6.6 g/dL (ref 6.5–8.1)

## 2023-06-27 LAB — CBC WITH DIFFERENTIAL/PLATELET
Abs Immature Granulocytes: 0.01 10*3/uL (ref 0.00–0.07)
Basophils Absolute: 0.1 10*3/uL (ref 0.0–0.1)
Basophils Relative: 1 %
Eosinophils Absolute: 0.3 10*3/uL (ref 0.0–0.5)
Eosinophils Relative: 5 %
HCT: 34 % — ABNORMAL LOW (ref 36.0–46.0)
Hemoglobin: 10.4 g/dL — ABNORMAL LOW (ref 12.0–15.0)
Immature Granulocytes: 0 %
Lymphocytes Relative: 29 %
Lymphs Abs: 1.4 10*3/uL (ref 0.7–4.0)
MCH: 29.4 pg (ref 26.0–34.0)
MCHC: 30.6 g/dL (ref 30.0–36.0)
MCV: 96 fL (ref 80.0–100.0)
Monocytes Absolute: 0.4 10*3/uL (ref 0.1–1.0)
Monocytes Relative: 9 %
Neutro Abs: 2.8 10*3/uL (ref 1.7–7.7)
Neutrophils Relative %: 56 %
Platelets: 182 10*3/uL (ref 150–400)
RBC: 3.54 MIL/uL — ABNORMAL LOW (ref 3.87–5.11)
RDW: 16.3 % — ABNORMAL HIGH (ref 11.5–15.5)
WBC: 5 10*3/uL (ref 4.0–10.5)
nRBC: 0 % (ref 0.0–0.2)

## 2023-06-27 LAB — URINALYSIS, ROUTINE W REFLEX MICROSCOPIC
Bilirubin Urine: NEGATIVE
Glucose, UA: NEGATIVE mg/dL
Hgb urine dipstick: NEGATIVE
Ketones, ur: NEGATIVE mg/dL
Leukocytes,Ua: NEGATIVE
Nitrite: NEGATIVE
Protein, ur: NEGATIVE mg/dL
Specific Gravity, Urine: 1.001 — ABNORMAL LOW (ref 1.005–1.030)
pH: 6 (ref 5.0–8.0)

## 2023-06-27 LAB — MAGNESIUM: Magnesium: 1.9 mg/dL (ref 1.7–2.4)

## 2023-06-27 LAB — PROTIME-INR
INR: 1.4 — ABNORMAL HIGH (ref 0.8–1.2)
Prothrombin Time: 17.5 s — ABNORMAL HIGH (ref 11.4–15.2)

## 2023-06-27 LAB — LIPASE, BLOOD: Lipase: 52 U/L — ABNORMAL HIGH (ref 11–51)

## 2023-06-27 LAB — AMMONIA: Ammonia: 66 umol/L — ABNORMAL HIGH (ref 9–35)

## 2023-06-27 MED ORDER — SODIUM CHLORIDE 0.9 % IV SOLN
2.0000 g | Freq: Once | INTRAVENOUS | Status: AC
  Administered 2023-06-27: 2 g via INTRAVENOUS
  Filled 2023-06-27: qty 20

## 2023-06-27 NOTE — ED Provider Notes (Signed)
 Patient was initially seen by Dr. Adan Holms this morning.  Please see his notes. Patient was coming in for reevaluation of confusion.  Patient was seen in the ER yesterday and was noted to have an elevated ammonia level.  Plan was to increase her lactulose  dosing and patient was discharged from the ER. Patient states she did not sleep much at all last night because of the frequent diarrhea that she had.  She is not having any vomiting.  She was able to eat without difficulty today.  Patient states she is feeling better now.  She is alert and oriented answering questions appropriately to me.  Patient's ammonia level is decreased to 66 from 98 yesterday.  Her CBC and metabolic panel otherwise unremarkable.  There is no signs of urinary tract infection.  Patient had a head CT yesterday did not show any acute abnormality.  Discussed the results with the patient and her son.  They are comfortable going home today.  It is possible that her decreased sleep last evening only from 3 AM to 6 AM caused some transient confusion.  At this time she is not confused or disoriented.  No signs of infection.  She does not appear encephalopathic.  Evaluation and diagnostic testing in the emergency department does not suggest an emergent condition requiring admission or immediate intervention beyond what has been performed at this time.  The patient is safe for discharge and has been instructed to return immediately for worsening symptoms, change in symptoms or any other concerns.    Trish Furl, MD 06/27/23 [phone]

## 2023-06-27 NOTE — ED Triage Notes (Addendum)
 Pt brought in by son for increased confusion. Pt was seen yesterday, was told to increase lactulose . Son states she has had one dose today, and already gone to the bathroom 10 times. Pt has also not been sleeping well, and has abdominal and back pain.

## 2023-06-27 NOTE — ED Provider Notes (Signed)
 [name] [facility]PARTMENT AT [facility] Provider Note   CSN: [id number] Arrival date & time: 06/27/23  1338     Patient presents with: Altered Mental Status   [name] is a 68 y.o. female.   68 year old female with a history of [name] cirrhosis who presents emergency department with confusion and abdominal pain.  History obtained per patient and her son.  They report that she has been confused recently despite taking her lactulose .  Yesterday had 10 bowel movements.  Does state with a mix at home as well.  Has had mild abdominal pain and nausea over the past few days as well.  Describes it as diffuse.  No known history of SBP.  Came in yesterday to the emergency department had a head CT that was reassuring and ammonia that was 98.  Was discharged home with instructions to continue her lactulose .       Prior to Admission medications   Medication Sig Start Date End Date Taking? Authorizing Provider  albuterol  (VENTOLIN  HFA) 108 (90 Base) MCG/ACT inhaler Inhale 2 puffs into the lungs every 6 (six) hours as needed for shortness of breath or wheezing. 02/13/23  Yes [provider]  atorvastatin  (LIPITOR) 40 MG tablet Take 1 tablet (40 mg total) by mouth in the morning. 02/22/23  Yes Amadeo June, MD  copper  tablet Take 2 mg by mouth in the morning and at bedtime.   Yes [provider]  cyanocobalamin  (VITAMIN B12) 1000 MCG tablet Take 2,000 mcg by mouth every Monday, Wednesday, and Friday.   Yes [provider]  dicyclomine  (BENTYL ) 10 MG capsule Take 1 capsule (10 mg total) by mouth 3 (three) times daily as needed for spasms (abdominal cramping). 09/09/22  Yes Faith Homes, MD  famotidine  (PEPCID ) 20 MG tablet Take 20 mg by mouth in the morning.   Yes [provider]  folic acid  (FOLVITE ) 1 MG tablet Take 1 tablet (1 mg total) by mouth daily. 06/16/22  Yes Rizwan, [name], MD  furosemide  (LASIX ) 40 MG tablet Take 0.5 tablets (20 mg  total) by mouth daily. Patient taking differently: Take 40 mg by mouth 2 (two) times daily. 05/29/23  Yes Audria Leather, MD  HYDROcodone -acetaminophen  (NORCO) 10-325 MG tablet Take 1 tablet by mouth every 8 (eight) hours as needed for moderate pain (pain score 4-6). 06/15/23  Yes [provider]  IRON -VITAMIN C  PO Take 1 tablet by mouth daily with breakfast.   Yes [provider]  lactulose  (CHRONULAC ) 10 GM/15ML solution Take 45 mLs (30 g total) by mouth 3 (three) times daily. Take to ensure that there is 2 loose bowel movement EVERY DAY 05/29/23  Yes Audria Leather, MD  levothyroxine  (SYNTHROID ) 175 MCG tablet Take 1 tablet (175 mcg total) by mouth daily at 6 (six) AM. Recheck TSH with PCP in 4 weeks 06/26/23  Yes Amadeo June, MD  lidocaine  (LIDODERM ) 5 % Place 1 patch onto the skin daily. Remove & Discard patch within 12 hours or as directed by MD Patient taking differently: Place 1 patch onto the skin daily as needed (for pain- Remove & Discard patch within 12 hours or as directed by MD). 10/31/22  Yes Kraig Peru, MD  magnesium  gluconate (MAGONATE) 500 (27 Mg) MG TABS tablet Take 1 tablet (500 mg total) by mouth in the morning and at bedtime. Patient taking differently: Take 1,000 mg by mouth daily. 05/01/23  Yes Amadeo June, MD  meclizine  (ANTIVERT ) 25 MG tablet Take 1 tablet (25 mg  total) by mouth 3 (three) times daily as needed for dizziness. 05/29/23  Yes Audria Leather, MD  megestrol  (MEGACE ) 40 MG tablet Take 1 tablet (40 mg total) by mouth 2 (two) times daily. 05/04/23  Yes Ajewole, [name], MD  methocarbamol  (ROBAXIN ) 500 MG tablet Take 500 mg by mouth.   Yes [provider]  midodrine  (PROAMATINE ) 10 MG tablet TAKE 1 TABLET(10 MG) BY MOUTH THREE TIMES DAILY WITH MEALS 06/26/23  Yes Amadeo June, MD  Multiple Vitamins-Minerals (CENTRUM SILVER ULTRA WOMENS PO) Take 1 tablet by mouth in the morning.   Yes [provider]  naloxone (NARCAN) nasal  spray 4 mg/0.1 mL Place 1 spray into the nose as needed (accidental overdose). 03/25/22  Yes [provider]  nystatin  cream (MYCOSTATIN ) Apply 1 Application topically 2 (two) times daily as needed for dry skin. 05/29/23  Yes Audria Leather, MD  pantoprazole  (PROTONIX ) 40 MG tablet Take 1 tablet (40 mg total) by mouth 2 (two) times daily before a meal. 06/09/23  Yes Amadeo June, MD  potassium chloride  (KLOR-CON ) 10 MEQ tablet Take 20 mEq by mouth 2 (two) times daily. 02/22/23  Yes [provider]  rifaximin  (XIFAXAN ) 550 MG TABS tablet Take 550 mg by mouth 2 (two) times daily.   Yes [provider]  rOPINIRole  (REQUIP ) 0.25 MG tablet TAKE 1 TABLET(0.25 MG) BY MOUTH AT BEDTIME 06/10/23  Yes Amadeo June, MD  senna-docusate (SENOKOT-S) 8.6-50 MG tablet Take 1 tablet by mouth 2 (two) times daily. 06/26/23  Yes Amadeo June, MD  spironolactone  (ALDACTONE ) 50 MG tablet Take 1 tablet (50 mg total) by mouth daily. 06/26/23 07/26/23 Yes Trifan, Janalyn Me, MD  tamsulosin  (FLOMAX ) 0.4 MG CAPS capsule Take 1 capsule (0.4 mg total) by mouth daily. 09/10/22  Yes Faith Homes, MD  TOBRADEX ophthalmic ointment Place 1 Application into the left eye daily as needed. 05/02/23  Yes [provider]  venlafaxine  XR (EFFEXOR -XR) 75 MG 24 hr capsule Take 1 capsule (75 mg total) by mouth daily with breakfast. 04/21/23  Yes Amadeo June, MD  Vitamin D, Ergocalciferol, (DRISDOL) 50000 UNITS CAPS Take 50,000 Units by mouth every Wednesday.   Yes [provider]  polyethylene glycol (MIRALAX  / GLYCOLAX ) 17 g packet Take 17 g by mouth daily as needed for moderate constipation. Patient not taking: Reported on 06/27/2023 05/29/23   Audria Leather, MD  spironolactone  (ALDACTONE ) 25 MG tablet Take 1 tablet (25 mg total) by mouth daily. Patient not taking: Reported on 06/27/2023 06/20/23   Aguiar, Rafaela, MD    Allergies: Ace inhibitors, Ivp dye [iodinated contrast media], Desitin [zinc   oxide], Gadolinium derivatives, Hydroxyzine , Penicillin g, Yellow jacket venom, Chlorhexidine , Doxycycline, Penicillins, and Zofran  [ondansetron ]    Review of Systems  Updated Vital Signs BP (!) 140/57   Pulse 85   Temp 98 F (36.7 C)   Resp 18   SpO2 99%   Physical Exam Vitals and nursing note reviewed.  Constitutional:      General: She is not in acute distress.    Appearance: She is well-developed.     Comments: Alert and oriented x 2  HENT:     Head: Normocephalic and atraumatic.     Right Ear: External ear normal.     Left Ear: External ear normal.     Nose: Nose normal.   Eyes:     Extraocular Movements: Extraocular movements intact.     Conjunctiva/sclera: Conjunctivae normal.     Comments: Hazy cornea on left eye  Pulmonary:  Effort: Pulmonary effort is normal. No respiratory distress.  Abdominal:     General: Abdomen is flat. There is no distension.     Palpations: Abdomen is soft. There is no mass.     Tenderness: There is abdominal tenderness (Mild, diffuse). There is no guarding.   Musculoskeletal:     Cervical back: Normal range of motion and neck supple.     Right lower leg: Edema present.     Left lower leg: Edema present.   Skin:    General: Skin is warm and dry.   Neurological:     Mental Status: She is alert. Mental status is at baseline.     Cranial Nerves: No cranial nerve deficit.     Sensory: No sensory deficit.     Motor: No weakness.     Comments: No asterixis noted  Psychiatric:        Mood and Affect: Mood normal.     (all labs ordered are listed, but only abnormal results are displayed) Labs Reviewed  COMPREHENSIVE METABOLIC PANEL WITH GFR - Abnormal; Notable for the following components:      Result Value   Glucose, Bld 114 (*)    BUN 24 (*)    Creatinine, Ser 1.15 (*)    Calcium  8.5 (*)    Albumin  2.7 (*)    Total Bilirubin 1.6 (*)    GFR, Estimated 53 (*)    All other components within normal limits  LIPASE, BLOOD -  Abnormal; Notable for the following components:   Lipase 52 (*)    All other components within normal limits  CBC WITH DIFFERENTIAL/PLATELET - Abnormal; Notable for the following components:   RBC 3.54 (*)    Hemoglobin 10.4 (*)    HCT 34.0 (*)    RDW 16.3 (*)    All other components within normal limits  URINALYSIS, ROUTINE W REFLEX MICROSCOPIC - Abnormal; Notable for the following components:   Color, Urine STRAW (*)    Specific Gravity, Urine 1.001 (*)    All other components within normal limits  AMMONIA - Abnormal; Notable for the following components:   Ammonia 66 (*)    All other components within normal limits  PROTIME-INR - Abnormal; Notable for the following components:   Prothrombin Time 17.5 (*)    INR 1.4 (*)    All other components within normal limits  CULTURE, BLOOD (ROUTINE X 2)  CULTURE, BLOOD (ROUTINE X 2)  MAGNESIUM     EKG: None  Radiology: CT Head Wo Contrast Result Date: 06/26/2023 CLINICAL DATA:  Mental status change, unknown cause EXAM: CT HEAD WITHOUT CONTRAST TECHNIQUE: Contiguous axial images were obtained from the base of the skull through the vertex without intravenous contrast. RADIATION DOSE REDUCTION: This exam was performed according to the departmental dose-optimization program which includes automated exposure control, adjustment of the mA and/or kV according to patient size and/or use of iterative reconstruction technique. COMPARISON:  None Available. FINDINGS: Brain: The ventricles appear age appropriate. No mass effect or midline shift. Gray-white differentiation is preserved.Periventricular and subcortical white matter hypoattenuation, most consistent with changes of moderate chronic ischemic microvascular disease.No evidence of acute territorial infarction, extra-axial fluid collection, hemorrhage, or mass lesion. The basilar cisterns are patent without downward herniation. The cerebellar hemispheres and vermis are well formed without mass lesion  or focal attenuation abnormality. Vascular: No hyperdense vessel. Calcified atherosclerotic plaque within the cavernous/supraclinoid ICA and intradural vertebral arteries. Skull: Normal. Negative for fracture or focal lesion. Sinuses/Orbits: The paranasal sinuses and  mastoids are clear.The globes appear intact. No retrobulbar hematoma. Other: None. IMPRESSION: 1. No acute intracranial abnormality, specifically, no acute hemorrhage, territorial infarction, or intracranial mass. 2. Sequelae of chronic ischemic microvascular disease. Electronically Signed   By: Rance Burrows M.D.   On: 06/26/2023 17:30     Procedures   Medications Ordered in the ED  cefTRIAXone  (ROCEPHIN ) 2 g in sodium chloride  0.9 % 100 mL IVPB (2 g Intravenous New Bag/Given 06/27/23 1559)    Clinical Course as of 06/27/23 1638  Tue Jun 27, 2023  1606 Ammonia(!) Ammonia level decreased at 66 [JK]  1606 CBC with Diff(!) CBC normal. [JK]  1606 Comprehensive metabolic panel(!) Metabolic panel unremarkable [JK]  1606 Urinalysis, Routine w reflex microscopic -Urine, Clean Catch(!) No signs of infection [JK]  1606 Protime-INR(!) INR normal [JK]    Clinical Course User Index [JK] Trish Furl, MD                                 Medical Decision Making Amount and/or Complexity of Data Reviewed Labs: ordered. Decision-making details documented in ED Course.   68 year old female with a history of [name] cirrhosis who presents emergency department with confusion and abdominal pain  Initial Ddx:  Hepatic encephalopathy, stroke, ICH, medication, UTI, SBP, SBO, dehydration  MDM/Course:  Patient presents to the emergency department with some confusion.  Was seen yesterday in the emergency department and did have an elevated ammonia.  Yesterday did also have a head CT that did not show acute finding.  Has been compliant with her lactulose .  Right now is alert and oriented x 2.  No focal neurologic deficits.  Does have some mild  abdominal tenderness to palpation but she does report that she does have abdominal pain that is somewhat difficult to assess if she has SBP or not currently.  Not having significant vomiting or decreased flatus that would be suggestive of a bowel obstruction.  Do not feel that she needs cross-sectional imaging of her abdomen at this time.  Did perform bedside ultrasound does not show evidence of a fluid pocket that would be amenable to paracentesis at this time.  Will go ahead and start her on ceftriaxone  out of an abundance of caution but still unclear whether or not she has SBP.  Signed out to the oncoming physician awaiting the results of her lab work including her ammonia.    This patient presents to the ED for concern of complaints listed in HPI, this involves an extensive number of treatment options, and is a complaint that carries with it a high risk of complications and morbidity. Disposition including potential need for admission considered.   Dispo: Pending remainder of workup  Additional history obtained from son Records reviewed ED Visit Notes I personally reviewed and interpreted cardiac monitoring: normal sinus rhythm  I personally reviewed and interpreted the pt's EKG: see above for interpretation  I have reviewed the patients home medications and made adjustments as needed Social Determinants of health:  Geriatric  Portions of this note were generated with Scientist, clinical (histocompatibility and immunogenetics). Dictation errors may occur despite best attempts at proofreading.     Final diagnoses:  Confusion    ED Discharge Orders     None          Ninetta Basket, MD 06/27/23 [phone]

## 2023-06-27 NOTE — Discharge Instructions (Signed)
 The ammonia level is decreasing.  You can hold the evening dose since it affected your sleep.  Try increasing the morning dose as you did today to compensate.  Return to the ER for fever or worsening symptoms.  Follow-up with your doctor this week to be rechecked.

## 2023-06-28 ENCOUNTER — Inpatient Hospital Stay (HOSPITAL_COMMUNITY)
Admission: EM | Admit: 2023-06-28 | Discharge: 2023-07-05 | DRG: 442 | Disposition: A | Discharge status: Home / Self Care | Payer: Medicare (Managed Care) | Attending: Internal Medicine | Admitting: Internal Medicine

## 2023-06-28 ENCOUNTER — Other Ambulatory Visit: Payer: Self-pay

## 2023-06-28 ENCOUNTER — Encounter (HOSPITAL_COMMUNITY): Payer: Self-pay | Admitting: Emergency Medicine

## 2023-06-28 ENCOUNTER — Emergency Department (HOSPITAL_COMMUNITY): Payer: Medicare (Managed Care)

## 2023-06-28 DIAGNOSIS — Z8349 Family history of other endocrine, nutritional and metabolic diseases: Secondary | ICD-10-CM

## 2023-06-28 DIAGNOSIS — J9811 Atelectasis: Secondary | ICD-10-CM | POA: Diagnosis present

## 2023-06-28 DIAGNOSIS — Z881 Allergy status to other antibiotic agents status: Secondary | ICD-10-CM

## 2023-06-28 DIAGNOSIS — Z87891 Personal history of nicotine dependence: Secondary | ICD-10-CM

## 2023-06-28 DIAGNOSIS — K7682 Hepatic encephalopathy: Principal | ICD-10-CM | POA: Diagnosis present

## 2023-06-28 DIAGNOSIS — Z833 Family history of diabetes mellitus: Secondary | ICD-10-CM

## 2023-06-28 DIAGNOSIS — K7460 Unspecified cirrhosis of liver: Secondary | ICD-10-CM | POA: Diagnosis present

## 2023-06-28 DIAGNOSIS — I129 Hypertensive chronic kidney disease with stage 1 through stage 4 chronic kidney disease, or unspecified chronic kidney disease: Secondary | ICD-10-CM | POA: Diagnosis present

## 2023-06-28 DIAGNOSIS — D631 Anemia in chronic kidney disease: Secondary | ICD-10-CM | POA: Diagnosis present

## 2023-06-28 DIAGNOSIS — Z8049 Family history of malignant neoplasm of other genital organs: Secondary | ICD-10-CM

## 2023-06-28 DIAGNOSIS — Z91041 Radiographic dye allergy status: Secondary | ICD-10-CM

## 2023-06-28 DIAGNOSIS — E669 Obesity, unspecified: Secondary | ICD-10-CM | POA: Diagnosis present

## 2023-06-28 DIAGNOSIS — R161 Splenomegaly, not elsewhere classified: Secondary | ICD-10-CM | POA: Diagnosis present

## 2023-06-28 DIAGNOSIS — E039 Hypothyroidism, unspecified: Secondary | ICD-10-CM | POA: Diagnosis present

## 2023-06-28 DIAGNOSIS — K90829 Short bowel syndrome, unspecified: Secondary | ICD-10-CM | POA: Diagnosis present

## 2023-06-28 DIAGNOSIS — Z6838 Body mass index (BMI) 38.0-38.9, adult: Secondary | ICD-10-CM

## 2023-06-28 DIAGNOSIS — Z888 Allergy status to other drugs, medicaments and biological substances status: Secondary | ICD-10-CM

## 2023-06-28 DIAGNOSIS — Z66 Do not resuscitate: Secondary | ICD-10-CM | POA: Diagnosis present

## 2023-06-28 DIAGNOSIS — D638 Anemia in other chronic diseases classified elsewhere: Secondary | ICD-10-CM | POA: Diagnosis present

## 2023-06-28 DIAGNOSIS — G8929 Other chronic pain: Secondary | ICD-10-CM | POA: Diagnosis present

## 2023-06-28 DIAGNOSIS — K529 Noninfective gastroenteritis and colitis, unspecified: Secondary | ICD-10-CM | POA: Diagnosis present

## 2023-06-28 DIAGNOSIS — Z808 Family history of malignant neoplasm of other organs or systems: Secondary | ICD-10-CM

## 2023-06-28 DIAGNOSIS — H5462 Unqualified visual loss, left eye, normal vision right eye: Secondary | ICD-10-CM | POA: Diagnosis present

## 2023-06-28 DIAGNOSIS — E785 Hyperlipidemia, unspecified: Secondary | ICD-10-CM | POA: Diagnosis present

## 2023-06-28 DIAGNOSIS — Z86711 Personal history of pulmonary embolism: Secondary | ICD-10-CM

## 2023-06-28 DIAGNOSIS — Z9049 Acquired absence of other specified parts of digestive tract: Secondary | ICD-10-CM

## 2023-06-28 DIAGNOSIS — Z79890 Hormone replacement therapy (postmenopausal): Secondary | ICD-10-CM

## 2023-06-28 DIAGNOSIS — Z8249 Family history of ischemic heart disease and other diseases of the circulatory system: Secondary | ICD-10-CM

## 2023-06-28 DIAGNOSIS — Z820 Family history of epilepsy and other diseases of the nervous system: Secondary | ICD-10-CM

## 2023-06-28 DIAGNOSIS — F411 Generalized anxiety disorder: Secondary | ICD-10-CM | POA: Diagnosis present

## 2023-06-28 DIAGNOSIS — K7581 Nonalcoholic steatohepatitis (NASH): Secondary | ICD-10-CM | POA: Diagnosis present

## 2023-06-28 DIAGNOSIS — I672 Cerebral atherosclerosis: Secondary | ICD-10-CM | POA: Diagnosis present

## 2023-06-28 DIAGNOSIS — G2581 Restless legs syndrome: Secondary | ICD-10-CM | POA: Diagnosis present

## 2023-06-28 DIAGNOSIS — D509 Iron deficiency anemia, unspecified: Secondary | ICD-10-CM | POA: Diagnosis present

## 2023-06-28 DIAGNOSIS — Z79899 Other long term (current) drug therapy: Secondary | ICD-10-CM

## 2023-06-28 DIAGNOSIS — K766 Portal hypertension: Secondary | ICD-10-CM | POA: Diagnosis present

## 2023-06-28 DIAGNOSIS — N1831 Chronic kidney disease, stage 3a: Secondary | ICD-10-CM | POA: Diagnosis present

## 2023-06-28 DIAGNOSIS — N200 Calculus of kidney: Secondary | ICD-10-CM | POA: Diagnosis present

## 2023-06-28 DIAGNOSIS — Z880 Allergy status to penicillin: Secondary | ICD-10-CM

## 2023-06-28 LAB — COMPREHENSIVE METABOLIC PANEL WITH GFR
ALT: 26 U/L (ref 0–44)
AST: 35 U/L (ref 15–41)
Albumin: 2.5 g/dL — ABNORMAL LOW (ref 3.5–5.0)
Alkaline Phosphatase: 109 U/L (ref 38–126)
Anion gap: 7 (ref 5–15)
BUN: 25 mg/dL — ABNORMAL HIGH (ref 8–23)
CO2: 21 mmol/L — ABNORMAL LOW (ref 22–32)
Calcium: 8.1 mg/dL — ABNORMAL LOW (ref 8.9–10.3)
Chloride: 111 mmol/L (ref 98–111)
Creatinine, Ser: 1.05 mg/dL — ABNORMAL HIGH (ref 0.44–1.00)
GFR, Estimated: 59 mL/min — ABNORMAL LOW (ref >60)
Glucose, Bld: 107 mg/dL — ABNORMAL HIGH (ref 70–99)
Potassium: 3.7 mmol/L (ref 3.5–5.1)
Sodium: 139 mmol/L (ref 135–145)
Total Bilirubin: 1.5 mg/dL — ABNORMAL HIGH (ref 0.0–1.2)
Total Protein: 6.2 g/dL — ABNORMAL LOW (ref 6.5–8.1)

## 2023-06-28 LAB — URINALYSIS, ROUTINE W REFLEX MICROSCOPIC
Bilirubin Urine: NEGATIVE
Glucose, UA: NEGATIVE mg/dL
Hgb urine dipstick: NEGATIVE
Ketones, ur: NEGATIVE mg/dL
Leukocytes,Ua: NEGATIVE
Nitrite: NEGATIVE
Protein, ur: NEGATIVE mg/dL
Specific Gravity, Urine: 1.008 (ref 1.005–1.030)
pH: 5 (ref 5.0–8.0)

## 2023-06-28 LAB — MAGNESIUM: Magnesium: 2 mg/dL (ref 1.7–2.4)

## 2023-06-28 LAB — CBC
HCT: 31.7 % — ABNORMAL LOW (ref 36.0–46.0)
Hemoglobin: 9.7 g/dL — ABNORMAL LOW (ref 12.0–15.0)
MCH: 29.3 pg (ref 26.0–34.0)
MCHC: 30.6 g/dL (ref 30.0–36.0)
MCV: 95.8 fL (ref 80.0–100.0)
Platelets: 169 10*3/uL (ref 150–400)
RBC: 3.31 MIL/uL — ABNORMAL LOW (ref 3.87–5.11)
RDW: 16.3 % — ABNORMAL HIGH (ref 11.5–15.5)
WBC: 5.5 10*3/uL (ref 4.0–10.5)
nRBC: 0 % (ref 0.0–0.2)

## 2023-06-28 LAB — RAPID URINE DRUG SCREEN, HOSP PERFORMED
Amphetamines: NOT DETECTED
Barbiturates: NOT DETECTED
Benzodiazepines: NOT DETECTED
Cocaine: NOT DETECTED
Opiates: NOT DETECTED
Tetrahydrocannabinol: NOT DETECTED

## 2023-06-28 LAB — CBG MONITORING, ED: Glucose-Capillary: 94 mg/dL (ref 70–99)

## 2023-06-28 LAB — AMMONIA: Ammonia: 127 umol/L — ABNORMAL HIGH (ref 9–35)

## 2023-06-28 LAB — LIPASE, BLOOD: Lipase: 48 U/L (ref 11–51)

## 2023-06-28 MED ORDER — LACTULOSE 10 GM/15ML PO SOLN
30.0000 g | Freq: Once | ORAL | Status: AC
  Administered 2023-06-28: 30 g via ORAL
  Filled 2023-06-28: qty 60

## 2023-06-28 MED ORDER — VENLAFAXINE HCL ER 75 MG PO CP24
75.0000 mg | ORAL_CAPSULE | Freq: Every day | ORAL | Status: DC
  Administered 2023-06-29 – 2023-07-05 (×7): 75 mg via ORAL
  Filled 2023-06-28 (×7): qty 1

## 2023-06-28 MED ORDER — LACTULOSE 10 GM/15ML PO SOLN
30.0000 g | Freq: Three times a day (TID) | ORAL | Status: DC
  Administered 2023-06-29 – 2023-07-05 (×17): 30 g via ORAL
  Filled 2023-06-28 (×18): qty 45

## 2023-06-28 MED ORDER — LIDOCAINE 5 % EX PTCH
1.0000 | MEDICATED_PATCH | CUTANEOUS | Status: DC
  Administered 2023-06-28 – 2023-06-30 (×2): 1 via TRANSDERMAL
  Filled 2023-06-28 (×3): qty 1

## 2023-06-28 MED ORDER — ATORVASTATIN CALCIUM 40 MG PO TABS
40.0000 mg | ORAL_TABLET | Freq: Every morning | ORAL | Status: DC
  Administered 2023-06-29 – 2023-07-05 (×7): 40 mg via ORAL
  Filled 2023-06-28 (×7): qty 1

## 2023-06-28 MED ORDER — ACETAMINOPHEN 650 MG RE SUPP: 650.0000 mg | Freq: Four times a day (QID) | RECTAL | Status: DC | PRN

## 2023-06-28 MED ORDER — PROCHLORPERAZINE EDISYLATE 10 MG/2ML IJ SOLN: 10.0000 mg | Freq: Four times a day (QID) | INTRAMUSCULAR | Status: DC | PRN

## 2023-06-28 MED ORDER — DIPHENHYDRAMINE HCL 25 MG PO CAPS
25.0000 mg | ORAL_CAPSULE | Freq: Four times a day (QID) | ORAL | Status: DC | PRN
  Administered 2023-06-28: 25 mg via ORAL
  Filled 2023-06-28: qty 1

## 2023-06-28 MED ORDER — HEPARIN SODIUM (PORCINE) 5000 UNIT/ML IJ SOLN
5000.0000 [IU] | Freq: Three times a day (TID) | INTRAMUSCULAR | Status: DC
  Administered 2023-06-28 – 2023-07-05 (×20): 5000 [IU] via SUBCUTANEOUS
  Filled 2023-06-28 (×20): qty 1

## 2023-06-28 MED ORDER — POTASSIUM CHLORIDE CRYS ER 20 MEQ PO TBCR
20.0000 meq | EXTENDED_RELEASE_TABLET | Freq: Two times a day (BID) | ORAL | Status: DC
  Administered 2023-06-28 – 2023-07-05 (×14): 20 meq via ORAL
  Filled 2023-06-28 (×16): qty 1

## 2023-06-28 MED ORDER — DICYCLOMINE HCL 10 MG PO CAPS: 10.0000 mg | ORAL_CAPSULE | Freq: Three times a day (TID) | ORAL | Status: DC | PRN

## 2023-06-28 MED ORDER — MIDODRINE HCL 5 MG PO TABS
10.0000 mg | ORAL_TABLET | Freq: Three times a day (TID) | ORAL | Status: DC
  Administered 2023-06-29 – 2023-07-04 (×16): 10 mg via ORAL
  Filled 2023-06-28 (×18): qty 2

## 2023-06-28 MED ORDER — RIFAXIMIN 550 MG PO TABS
550.0000 mg | ORAL_TABLET | Freq: Two times a day (BID) | ORAL | Status: DC
  Administered 2023-06-28 – 2023-07-05 (×14): 550 mg via ORAL
  Filled 2023-06-28 (×15): qty 1

## 2023-06-28 MED ORDER — ALBUTEROL SULFATE (2.5 MG/3ML) 0.083% IN NEBU: 3.0000 mL | INHALATION_SOLUTION | Freq: Four times a day (QID) | RESPIRATORY_TRACT | Status: DC | PRN

## 2023-06-28 MED ORDER — SPIRONOLACTONE 25 MG PO TABS
50.0000 mg | ORAL_TABLET | Freq: Every day | ORAL | Status: DC
  Administered 2023-06-29 – 2023-07-05 (×7): 50 mg via ORAL
  Filled 2023-06-28 (×7): qty 2

## 2023-06-28 MED ORDER — PANTOPRAZOLE SODIUM 40 MG PO TBEC
40.0000 mg | DELAYED_RELEASE_TABLET | Freq: Two times a day (BID) | ORAL | Status: DC
  Administered 2023-06-29 – 2023-07-05 (×13): 40 mg via ORAL
  Filled 2023-06-28 (×13): qty 1

## 2023-06-28 MED ORDER — ROPINIROLE HCL 0.25 MG PO TABS
0.2500 mg | ORAL_TABLET | Freq: Every day | ORAL | Status: DC
  Administered 2023-06-28 – 2023-07-04 (×7): 0.25 mg via ORAL
  Filled 2023-06-28 (×8): qty 1

## 2023-06-28 MED ORDER — ACETAMINOPHEN 325 MG PO TABS
650.0000 mg | ORAL_TABLET | Freq: Four times a day (QID) | ORAL | Status: DC | PRN
Start: 2023-06-28 — End: 2023-07-05
  Administered 2023-06-28 – 2023-07-05 (×5): 650 mg via ORAL
  Filled 2023-06-28 (×5): qty 2

## 2023-06-28 MED ORDER — FUROSEMIDE 40 MG PO TABS
40.0000 mg | ORAL_TABLET | Freq: Every day | ORAL | Status: DC
  Administered 2023-06-29 – 2023-07-05 (×7): 40 mg via ORAL
  Filled 2023-06-28 (×7): qty 1

## 2023-06-28 MED ORDER — LEVOTHYROXINE SODIUM 25 MCG PO TABS
175.0000 ug | ORAL_TABLET | Freq: Every day | ORAL | Status: DC
  Administered 2023-06-29 – 2023-07-05 (×7): 175 ug via ORAL
  Filled 2023-06-28: qty 2
  Filled 2023-06-28 (×2): qty 1
  Filled 2023-06-28: qty 2
  Filled 2023-06-28 (×2): qty 1
  Filled 2023-06-28: qty 2
  Filled 2023-06-28 (×2): qty 1
  Filled 2023-06-28 (×2): qty 2
  Filled 2023-06-28: qty 1

## 2023-06-28 NOTE — ED Provider Notes (Signed)
 [place] EMERGENCY DEPARTMENT AT [facility] Provider Note   CSN: [id number] Arrival date & time: 06/28/23  1352     Patient presents with: Altered Mental Status   [name] is a 68 y.o. female.    Altered Mental Status Presenting symptoms: confusion   Associated symptoms: abdominal pain    Patient is a 68 year old female presents ED today with complaints of confusion with son, states that they have been seen the previous 2 days in the ED, noting that she has a previous history of [name], hypothyroidism, coagulopathy, anxiety disorder and hepatic encephalopathy.  Today, she was reporting generalized abdominal pain, chronic back pain, nausea.  With son reporting increased confusion.  Reported having gone home and increasing lactulose , son stating that she has only had 3 bowel movements since then which is abnormal for her as she has chronic diarrhea.  Also reports that she has been increasingly more confused, stating that she has been coming more anxious and thinking the toilet was a sink and being unable to wipe herself like she normally can.  Son also reports clay colored stools that smell like ammonia.  States this has been ongoing in the past, she required a CT of her abdomen and admission for similar symptoms.  She is also reporting lower leg swelling that has been decreasing.  Denies fever, headache, cough, chest pain, vomiting, hematochezia, melena, dysuria, vaginal pain, vaginal itching.    Prior to Admission medications   Medication Sig Start Date End Date Taking? Authorizing Provider  albuterol  (VENTOLIN  HFA) 108 (90 Base) MCG/ACT inhaler Inhale 2 puffs into the lungs every 6 (six) hours as needed for shortness of breath or wheezing. 02/13/23   [provider]  atorvastatin  (LIPITOR) 40 MG tablet Take 1 tablet (40 mg total) by mouth in the morning. 02/22/23   [name], MD  copper  tablet Take 2 mg by mouth in the morning and at  bedtime.    [provider]  cyanocobalamin  (VITAMIN B12) 1000 MCG tablet Take 2,000 mcg by mouth every Monday, Wednesday, and Friday.    [provider]  dicyclomine  (BENTYL ) 10 MG capsule Take 1 capsule (10 mg total) by mouth 3 (three) times daily as needed for spasms (abdominal cramping). 09/09/22   Patsy Lenis, MD  famotidine  (PEPCID ) 20 MG tablet Take 20 mg by mouth in the morning.    [provider]  folic acid  (FOLVITE ) 1 MG tablet Take 1 tablet (1 mg total) by mouth daily. 06/16/22   Rizwan, [name], MD  furosemide  (LASIX ) 40 MG tablet Take 0.5 tablets (20 mg total) by mouth daily. Patient taking differently: Take 40 mg by mouth 2 (two) times daily. 05/29/23   Cheryle Page, MD  HYDROcodone -acetaminophen  (NORCO) 10-325 MG tablet Take 1 tablet by mouth every 8 (eight) hours as needed for moderate pain (pain score 4-6). 06/15/23   [provider]  IRON -VITAMIN C  PO Take 1 tablet by mouth daily with breakfast.    [provider]  lactulose  (CHRONULAC ) 10 GM/15ML solution Take 45 mLs (30 g total) by mouth 3 (three) times daily. Take to ensure that there is 2 loose bowel movement EVERY DAY 05/29/23   Cheryle Page, MD  levothyroxine  (SYNTHROID ) 175 MCG tablet Take 1 tablet (175 mcg total) by mouth daily at 6 (six) AM. Recheck TSH with PCP in 4 weeks 06/26/23   [name], MD  lidocaine  (LIDODERM ) 5 % Place 1 patch onto the skin daily. Remove & Discard patch within 12  hours or as directed by MD Patient taking differently: Place 1 patch onto the skin daily as needed (for pain- Remove & Discard patch within 12 hours or as directed by MD). 10/31/22   Tobie Yetta HERO, MD  magnesium  gluconate (MAGONATE) 500 (27 Mg) MG TABS tablet Take 1 tablet (500 mg total) by mouth in the morning and at bedtime. Patient taking differently: Take 1,000 mg by mouth daily. 05/01/23   [name], MD  meclizine  (ANTIVERT ) 25 MG tablet Take 1 tablet (25 mg total) by mouth 3  (three) times daily as needed for dizziness. 05/29/23   Cheryle Page, MD  megestrol  (MEGACE ) 40 MG tablet Take 1 tablet (40 mg total) by mouth 2 (two) times daily. 05/04/23   Ajewole, [name], MD  methocarbamol  (ROBAXIN ) 500 MG tablet Take 500 mg by mouth.    [provider]  midodrine  (PROAMATINE ) 10 MG tablet TAKE 1 TABLET(10 MG) BY MOUTH THREE TIMES DAILY WITH MEALS 06/26/23   [name], MD  Multiple Vitamins-Minerals (CENTRUM SILVER ULTRA WOMENS PO) Take 1 tablet by mouth in the morning.    [provider]  naloxone [facility]) nasal spray 4 mg/0.1 mL Place 1 spray into the nose as needed (accidental overdose). 03/25/22   [provider]  nystatin  cream (MYCOSTATIN ) Apply 1 Application topically 2 (two) times daily as needed for dry skin. 05/29/23   Cheryle Page, MD  pantoprazole  (PROTONIX ) 40 MG tablet Take 1 tablet (40 mg total) by mouth 2 (two) times daily before a meal. 06/09/23   [name], MD  polyethylene glycol (MIRALAX  / GLYCOLAX ) 17 g packet Take 17 g by mouth daily as needed for moderate constipation. Patient not taking: Reported on 06/27/2023 05/29/23   Cheryle Page, MD  potassium chloride  (KLOR-CON ) 10 MEQ tablet Take 20 mEq by mouth 2 (two) times daily. 02/22/23   [provider]  rifaximin  (XIFAXAN ) 550 MG TABS tablet Take 550 mg by mouth 2 (two) times daily.    [provider]  rOPINIRole  (REQUIP ) 0.25 MG tablet TAKE 1 TABLET(0.25 MG) BY MOUTH AT BEDTIME 06/10/23   [name], MD  senna-docusate (SENOKOT-S) 8.6-50 MG tablet Take 1 tablet by mouth 2 (two) times daily. 06/26/23   [name], MD  spironolactone  (ALDACTONE ) 25 MG tablet Take 1 tablet (25 mg total) by mouth daily. Patient not taking: Reported on 06/27/2023 06/20/23   [name], MD  spironolactone  (ALDACTONE ) 50 MG tablet Take 1 tablet (50 mg total) by mouth daily. 06/26/23 07/26/23  Cottie Donnice PARAS, MD  tamsulosin  (FLOMAX ) 0.4 MG CAPS capsule Take 1  capsule (0.4 mg total) by mouth daily. 09/10/22   Patsy Lenis, MD  TOBRADEX ophthalmic ointment Place 1 Application into the left eye daily as needed. 05/02/23   [provider]  venlafaxine  XR (EFFEXOR -XR) 75 MG 24 hr capsule Take 1 capsule (75 mg total) by mouth daily with breakfast. 04/21/23   [name], MD  Vitamin D, Ergocalciferol, (DRISDOL) 50000 UNITS CAPS Take 50,000 Units by mouth every Wednesday.    [provider]    Allergies: Ace inhibitors, Ivp dye [iodinated contrast media], Desitin [zinc  oxide], Gadolinium derivatives, Hydroxyzine , Penicillin g, Yellow jacket venom, Chlorhexidine , Doxycycline, Penicillins, and Zofran  [ondansetron ]    Review of Systems  Respiratory:  Positive for shortness of breath.   Gastrointestinal:  Positive for abdominal pain.       Clay colored stools  Psychiatric/Behavioral:  Positive for confusion and dysphoric mood.   All other systems reviewed and are negative.  Updated Vital Signs BP 121/70 (BP Location: Right Arm)   Pulse 79   Temp 98.9 F (37.2 C) (Oral)   Resp 19   SpO2 99%   Physical Exam Vitals and nursing note reviewed.  Constitutional:      General: She is not in acute distress.    Appearance: Normal appearance. She is not ill-appearing or diaphoretic.  HENT:     Head: Normocephalic and atraumatic.   Eyes:     General:        Right eye: No discharge.        Left eye: No discharge.     Extraocular Movements: Extraocular movements intact.     Conjunctiva/sclera: Conjunctivae normal.    Cardiovascular:     Rate and Rhythm: Normal rate and regular rhythm.     Pulses: Normal pulses.     Heart sounds: Murmur heard.     No friction rub. No gallop.  Pulmonary:     Effort: Pulmonary effort is normal. No respiratory distress.     Breath sounds: No stridor. Rhonchi (Bilateral rhonchi noted) present. No wheezing or rales.  Chest:     Chest wall: No tenderness.  Abdominal:     General: Abdomen is flat.  There is distension.     Palpations: Abdomen is soft.     Tenderness: There is abdominal tenderness (Generalized abdominal pain to palpation, most notable over the epigastric and right upper quadrant regions). There is no guarding.   Musculoskeletal:     Cervical back: Normal range of motion. No rigidity.     Right lower leg: Edema present.     Left lower leg: Edema present.   Skin:    General: Skin is warm and dry.     Findings: No bruising.   Neurological:     General: No focal deficit present.     Mental Status: She is alert. Mental status is at baseline.     Cranial Nerves: Cranial nerve deficit present.     Sensory: No sensory deficit.     Motor: No weakness.     Coordination: Coordination normal.     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  No asterixis, no facial asymmetry, no ataxia, no apraxia, no arm drift, normal coordination with finger-to-nose, normal sensation to both upper and lower extremities bilaterally, normal grip strength bilaterally, normal strength to both flexion and extension to both upper lower extremities  bilaterally.  Alert and oriented to self, place, president, event but unable to remember time.  Psychiatric:        Mood and Affect: Mood normal.     (all labs ordered are listed, but only abnormal results are displayed) Labs Reviewed  COMPREHENSIVE METABOLIC PANEL WITH GFR - Abnormal; Notable for the following components:      Result Value   CO2 21 (*)    Glucose, Bld 107 (*)    BUN 25 (*)    Creatinine, Ser 1.05 (*)    Calcium  8.1 (*)    Total Protein 6.2 (*)    Albumin  2.5 (*)    Total Bilirubin 1.5 (*)    GFR, Estimated 59 (*)    All other components within normal limits  CBC - Abnormal; Notable for the following components:   RBC 3.31 (*)    Hemoglobin 9.7 (*)    HCT 31.7 (*)    RDW 16.3 (*)    All other components within normal limits  URINALYSIS, ROUTINE W REFLEX MICROSCOPIC - Abnormal; Notable for the following  components:    Color, Urine STRAW (*)    All other components within normal limits  AMMONIA - Abnormal; Notable for the following components:   Ammonia 127 (*)    All other components within normal limits  MAGNESIUM   RAPID URINE DRUG SCREEN, HOSP PERFORMED  LIPASE, BLOOD  CBG MONITORING, ED    EKG: EKG Interpretation Date/Time:  Wednesday June 28 2023 16:46:42 EDT Ventricular Rate:  67 PR Interval:  201 QRS Duration:  95 QT Interval:  441 QTC Calculation: 466 R Axis:   5  Text Interpretation: Sinus rhythm Baseline wander Confirmed by Bernard Drivers ([id number]) on 06/28/2023 4:58:20 PM  Radiology: CT CHEST ABDOMEN PELVIS WO CONTRAST Result Date: 06/28/2023 EXAM:  CT CHEST ABDOMEN PELVIS WITHOUT IV CONTRAST INDICATION: shortness of breath with rhonchi, generalized abdominal pain, bilateral back pain. Hx of hepatic encephalopathy. All Sx increasing in intensity over 3 days. TECHNIQUE: Spiral CT scanning was performed through the chest, abdomen and pelvis with no contrast. COMPARISON: 05/30/2023 CT abdomen and pelvis, 08/29/2022 CT chest FINDINGS: The cardiac size is within normal limits. Extensive coronary calcification is present. There is no thoracic aortic aneurysm. No filling defects are identified in the central pulmonary arteries. The esophagus and thyroid  glands have a normal appearance. There is no mass or adenopathy in the chest. No pleural or pericardial effusion is present. There is some mild scarring or discoid atelectasis are present in the inferior lingula and left lower lobe. The right lung is clear. Mild hepatic lobulation is present, suggesting cirrhosis. There is stable splenomegaly. The pancreas has a normal appearance. The gallbladder is surgically absent. There is a small nonobstructive right renal calculus. There are no adrenal masses. Postsurgical changes are present in the right colon. There is no bowel dilatation or bowel wall thickening. There is no evidence of ascites or adenopathy. No  abdominal aortic aneurysm is present. There is stable rectus laxity. An enlarged uterus is present with calcified fibroids. There is mesh in the anterior pelvic wall from previous ventral hernia repair. A small fluid-filled left paramedian ventral hernia is present at the level of the uterus which is unchanged. There is no inguinal hernia. The pelvic bowel loops have a normal appearance. There is no acute fracture or bone destruction. A stable T12 compression deformity is present with bone cement. IMPRESSION: 1. No evidence of acute process. 2. Redemonstration of probable cirrhosis with splenomegaly. 3. Small right renal calculus. 4. Additional nonacute findings as described above. Please note that CT scanning at this site utilizes multiple dose reduction techniques, including automatic exposure control, adjustment of the MAA and/or KVP according to the patient's size, and use of iterative reconstruction. Electronically signed by: Eddy Oar MD 06/28/2023 05:33 PM EDT RP Workstation: [id number]     Procedures   Medications Ordered in the ED  lactulose  (CHRONULAC ) 10 GM/15ML solution 30 g (30 g Oral Given 06/28/23 1752)                                 Medical Decision Making Amount and/or Complexity of Data Reviewed Labs: ordered. Radiology: ordered. ECG/medicine tests: ordered.  Risk Prescription drug management.   This patient is a 68 year old female with some who presents to the ED for concern of increased confusion despite increasing lactulose , believed to have a increase in her ammonia.  Reporting general abdominal pain as well as some shortness of breath.  On physical exam, patient is in no acute  distress, afebrile, alert and orient x 3, speaking in full sentences, nontachypneic, nontachycardic. Noted to have some rhonchi bilaterally.  Previously noted murmur also noted noted to have generalized abdominal pain, most notable in the right upper quadrant and epigastric regions.  Patient  able to move all limbs grossly and has good coordination.  No asterixis noted on exam.  With patient having both mild shortness of breath with rhonchi and generalized abdominal pain with this being her second visit, we will go ahead and CT her chest abdomen pelvis without contrast due to her contrast allergy.  Will also repeat ammonia levels and magnesium  levels and baseline labs to assess for any new developments as she and her son are reporting increased confusion.  CT abdomen shows nephrolithiasis without hydronephrosis or pyelonephritis with no other acute findings.  Ammonia level is elevated to 120 despite increase in lactulose  previously.  Will provide lactulose  in the ED.  With patient having hepatic encephalopathy at this time, with confusion likely due from the elevated ammonia we will see to admit to hospitalist.  Patient care was discussed with hospitalist who agreed to take care of patient.  Patient care transferred over to [facility] hospitalist  Differential diagnoses prior to evaluation: The emergent differential diagnosis includes, but is not limited to, hepatic encephalopathy, constipation, SBO, LBO, pneumonia, ascites, polypharmacy, metabolic disturbance, dehydration, cholangitis, peritonitis, esophageal varices. This is not an exhaustive differential.   Past Medical History / Co-morbidities / Social History: Chronic diarrhea, DNR, HTN, PE, hyperammonemia, abdominal hernia, thyroid  disease, hepatic encephalopathy, short gut syndrome, NASH cirrhosis, DVT  Status post cholecystectomy, ileostomy, liver resection, abdominal wall hernia mesh removal  Additional history: Chart reviewed. Pertinent results include:   Patient seen in the ED for last 2 days for same complaint of confusion.  Noted to have a previous history of Nash cirrhosis, hypothyroidism, hyperlipidemia, reflux, chronic anemia, hepatic encephalopathy, coagulopathy and anxiety disorder.  Seen yesterday for persistent  confusion after noting to have an elevated ammonia level with the visit the day before.  Patient has already received a CT of her head.  UA was negative.  Patient was neither confused or disoriented, did notice sleep disturbance which was suspected to be the culprit of the transient confusion.  Provided Rocephin  yesterday out of an abundance of precaution.  Lab Tests/Imaging studies: I personally interpreted labs/imaging and the pertinent results include:   CBC shows anemia with a hemoglobin of 9.7, slightly decreased from previous which was 10.4 CMP shows improved  creatinine and GFR but did note a hypocalcemia of 8.1 Lipase normal, magnesium  normal, CBC normal, urine drug screen unremarkable.  Ammonia noted to be 127  CT abdomen chest abdomen pelvis notes some atelectasis as well as mild hepatic lobulation.  As well as a small nonobstructive right renal calculus.  Also noting calcified fibroids.  And a stable splenomegaly.  I agree with the radiologist interpretation.  Cardiac monitoring: EKG obtained and interpreted by myself and attending physician which shows: Sinus rhythm with some baseline wander   EKG Interpretation Date/Time:  Wednesday June 28 2023 16:46:42 EDT Ventricular Rate:  67 PR Interval:  201 QRS Duration:  95 QT Interval:  441 QTC Calculation: 466 R Axis:   5  Text Interpretation: Sinus rhythm Baseline wander Confirmed by Bernard Drivers ([id number]) on 06/28/2023 4:58:20 PM      '   Medications: I ordered medication including lactulose .  I have reviewed the patients home medicines and have made adjustments as needed.  Critical Interventions: none  Social Determinants of Health: lives at home with son who does not believe he is able to take care of her in her confused state.   Disposition: After consideration of the diagnostic results and the patients response to treatment, I feel that the patient would benefit from admission.  Patient care was discussed with  [facility] hospitalist who agreed to take care of this patient at this time.   Final diagnoses:  Hepatic encephalopathy [facility])    ED Discharge Orders     None          Shaneequa Bahner S, PA-C 06/28/23 1905    Bernard Drivers, MD 06/29/23 [phone]

## 2023-06-28 NOTE — ED Triage Notes (Signed)
 Patient presents do to increased confusion. She was seen yesterday and an high ammonia levels. While at home she took lactulose , but could only have one bowel movement, which is unusual for her. She has also been more lethargic.

## 2023-06-28 NOTE — Hospital Course (Signed)
 [name] is a 68 y.o. female with medical history significant for [name] cirrhosis with recurrent hepatic encephalopathy, CKD stage IIIa, iron  deficiency anemia, hypothyroidism, GAD, RLS, chronic abdominal pain, short gut syndrome, and chronic diarrhea who is admitted with acute hepatic encephalopathy.

## 2023-06-28 NOTE — H&P (Signed)
 History and Physical    [name] WUJ:[id number] DOB: 09-20-1955 DOA: 06/28/2023  PCP: Amadeo June, MD  Patient coming from: Home  I have personally briefly reviewed patient's old medical records in [facility]  Chief Complaint: Confusion  HPI: [name] is a 68 y.o. female with medical history significant for [name] cirrhosis with recurrent hepatic encephalopathy, CKD stage IIIa, iron  deficiency anemia, hypothyroidism, GAD, RLS, chronic abdominal pain, short gut syndrome, and chronic diarrhea who presented to the ED for evaluation of altered mental status.  Patient with recurrent issues related to hepatic encephalopathy.  She has been seen in the ED last 2 days.  On 6/16 it was recommended that she increase her lactulose  frequency from twice a day to 3 times daily.  Her spironolactone  dose was also increased from 25 to 50 mg.  She returned to the ED yesterday 6/17 for reevaluation of confusion.  At this time her ammonia level decreased to 66 from 98 the day prior.  It was noted that she did not sleep much the night before which was felt to have caused transient confusion.  She did not have signs of infection and on reevaluation she was not confused or disoriented and ultimately discharged back to home.  Patient again returned today with son with reported decreased frequency of bowel movements.  Son states that she does have frequent diarrhea and at her baseline will have 5-6 bowel movements per day.  He will give her lactulose  in the morning and if she does not have adequate bowel movements will redose in the afternoon.  He says that she had one bowel movement in the morning.  He noted again that she seemed confused.  She tried to use the toilet as a sink and also later sat on her bedside commode without opening the lid in an attempt to use the toilet.  Patient states that she has been having some nausea but no vomiting.  She reports some left lower abdominal discomfort.  Son  states that earlier this week she had significant swelling to both of her lower extremities which has improved with increased dose of spironolactone .  ED Course  Labs/Imaging on admission: I have personally reviewed following labs and imaging studies.  Initial vitals showed BP 128/60, pulse 81, RR 18, temp 98.9 F, SpO2 100% on room air.  Labs show ammonia 127, lipase 48, sodium 131, potassium 3.7, bicarb 21, BUN 25, creatinine 1.05, AST 35, ALT 26, alk phos 109, total bili 1.5, WBC 5.5, hemoglobin 9.7, platelets 169.  UDS negative.  CT chest/abdomen/pelvis with contrast negative for acute process.  Small right renal calculus without obstruction.    Patient was given lactulose  and the hospitalist service was consulted to admit.  Review of Systems: All systems reviewed and are negative except as documented in history of present illness above.   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   Hyperammonemia (HCC) 09/30/2022   Hypertension  Kidney stone    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   Obesity    Pulmonary emboli (HCC) 01/10/2009   Pulmonary embolism (HCC)    Short gut syndrome    Thyroid  disease     Past Surgical History:  Procedure Laterality Date   ABDOMINAL WALL MESH  REMOVAL     2016   CESAREAN SECTION WITH BILATERAL TUBAL LIGATION  04/30/1985   CHOLECYSTECTOMY  05/2007   Exploratory laparotomy, lysis of adhesions, takedown of ileostomy with  small bowel resection, open cholecystectomy and ileocolostomy   COLON SURGERY  01/2007   exploratory laparotomy with right colecotmy and end ileostomy   COLONOSCOPY  04/02/2019   Dr Lora Robinsons Ardeth Krabbe, MD [facility]   ESOPHAGOGASTRODUODENOSCOPY (EGD) WITH PROPOFOL  N/A 10/19/2022   Procedure: ESOPHAGOGASTRODUODENOSCOPY (EGD) WITH PROPOFOL ;  Surgeon: Genell Ken, MD;  Location: [facility];  Service: Gastroenterology;  Laterality: N/A;   ESOPHAGOSCOPY  04/02/2019   EYE SURGERY Left 10/06/2020   Cataract extraciton w/intraocular lens implant- Dr Lunda Salines   HERNIA REPAIR  10/2007   ILEOSTOMY  2009   states bowel perfotation wih colonsocopy   ILEOSTOMY CLOSURE  2009   INCISIONAL HERNIA REPAIR  10/11/2018   IR KYPHO THORACIC WITH BONE BIOPSY  08/31/2022   IR KYPHO THORACIC WITH BONE BIOPSY  10/27/2022   IR RADIOLOGIST EVAL & MGMT  09/27/2022   KIDNEY STONE SURGERY     LIVER RESECTION     2009   PANNICULECTOMY     repair of abdominal wall hernia   WRIST SURGERY     tendonitis    Social History: Social History   Tobacco Use   Smoking status: Former    Current packs/day: 0.00    Types: Cigarettes    Quit date: 1987    Years since quitting: 38.4    Passive exposure: Past   Smokeless tobacco: Never  Vaping Use   Vaping status: Never Used  Substance Use Topics   Alcohol use: No   Drug use: No    Allergies  Allergen Reactions   Ace Inhibitors Anaphylaxis and Swelling   Ivp Dye [Iodinated Contrast Media] Anaphylaxis   Desitin [Zinc  Oxide] Rash and Other (See Comments)    Worsens rash   Gadolinium Derivatives Other (See Comments)    Reaction??   Hydroxyzine  Other (See Comments)    Affects the mind   Penicillin G Dermatitis   Yellow Jacket Venom Swelling   Chlorhexidine  Rash and Other (See Comments)    WORSENS RASHES   Doxycycline Rash   Penicillins Rash   Zofran  [Ondansetron ] Rash    Family History  Problem Relation Age of Onset   Uterine cancer Mother    Bone cancer  Father    Heart disease Father    Hyperlipidemia Father    Dementia Father    Hypertension Father    Skin cancer Sister    Hypertension Sister    Diabetes Sister    Hyperlipidemia Brother    Hypertension Brother    Heart disease Brother      Prior to Admission medications   Medication Sig Start Date End Date Taking? Authorizing Provider  albuterol  (VENTOLIN  HFA) 108 (90 Base) MCG/ACT inhaler Inhale 2 puffs into the lungs every 6 (six) hours as needed for shortness of breath or wheezing. 02/13/23   [provider]  atorvastatin  (LIPITOR) 40 MG tablet Take 1 tablet (40 mg total) by mouth in the morning. 02/22/23   Amadeo June, MD  copper  tablet Take 2 mg by  mouth in the morning and at bedtime.    [provider]  cyanocobalamin  (VITAMIN B12) 1000 MCG tablet Take 2,000 mcg by mouth every Monday, Wednesday, and Friday.    [provider]  dicyclomine  (BENTYL ) 10 MG capsule Take 1 capsule (10 mg total) by mouth 3 (three) times daily as needed for spasms (abdominal cramping). 09/09/22   Faith Homes, MD  famotidine  (PEPCID ) 20 MG tablet Take 20 mg by mouth in the morning.    [provider]  folic acid  (FOLVITE ) 1 MG tablet Take 1 tablet (1 mg total) by mouth daily. 06/16/22   Rizwan, [name], MD  furosemide  (LASIX ) 40 MG tablet Take 0.5 tablets (20 mg total) by mouth daily. Patient taking differently: Take 40 mg by mouth 2 (two) times daily. 05/29/23   Audria Leather, MD  HYDROcodone -acetaminophen  (NORCO) 10-325 MG tablet Take 1 tablet by mouth every 8 (eight) hours as needed for moderate pain (pain score 4-6). 06/15/23   [provider]  IRON -VITAMIN C  PO Take 1 tablet by mouth daily with breakfast.    [provider]  lactulose  (CHRONULAC ) 10 GM/15ML solution Take 45 mLs (30 g total) by mouth 3 (three) times daily. Take to ensure that there is 2 loose bowel movement EVERY DAY 05/29/23   Audria Leather, MD  levothyroxine  (SYNTHROID ) 175 MCG  tablet Take 1 tablet (175 mcg total) by mouth daily at 6 (six) AM. Recheck TSH with PCP in 4 weeks 06/26/23   Amadeo June, MD  lidocaine  (LIDODERM ) 5 % Place 1 patch onto the skin daily. Remove & Discard patch within 12 hours or as directed by MD Patient taking differently: Place 1 patch onto the skin daily as needed (for pain- Remove & Discard patch within 12 hours or as directed by MD). 10/31/22   Kraig Peru, MD  magnesium  gluconate (MAGONATE) 500 (27 Mg) MG TABS tablet Take 1 tablet (500 mg total) by mouth in the morning and at bedtime. Patient taking differently: Take 1,000 mg by mouth daily. 05/01/23   Amadeo June, MD  meclizine  (ANTIVERT ) 25 MG tablet Take 1 tablet (25 mg total) by mouth 3 (three) times daily as needed for dizziness. 05/29/23   Audria Leather, MD  megestrol  (MEGACE ) 40 MG tablet Take 1 tablet (40 mg total) by mouth 2 (two) times daily. 05/04/23   Ajewole, [name], MD  methocarbamol  (ROBAXIN ) 500 MG tablet Take 500 mg by mouth.    [provider]  midodrine  (PROAMATINE ) 10 MG tablet TAKE 1 TABLET(10 MG) BY MOUTH THREE TIMES DAILY WITH MEALS 06/26/23   Amadeo June, MD  Multiple Vitamins-Minerals (CENTRUM SILVER ULTRA WOMENS PO) Take 1 tablet by mouth in the morning.    [provider]  naloxone [facility]) nasal spray 4 mg/0.1 mL Place 1 spray into the nose as needed (accidental overdose). 03/25/22   [provider]  nystatin  cream (MYCOSTATIN ) Apply 1 Application topically 2 (two) times daily as needed for dry skin. 05/29/23   Audria Leather, MD  pantoprazole  (PROTONIX ) 40 MG tablet Take 1 tablet (40 mg total) by mouth 2 (two) times daily before a meal. 06/09/23   Amadeo June, MD  polyethylene glycol (MIRALAX  / GLYCOLAX ) 17 g packet Take 17 g by mouth daily as needed for moderate constipation. Patient not taking: Reported on 06/27/2023 05/29/23   Audria Leather, MD  potassium chloride  (KLOR-CON ) 10 MEQ tablet Take 20 mEq by mouth 2 (two) times  daily. 02/22/23   [provider]  rifaximin  (XIFAXAN ) 550  MG TABS tablet Take 550 mg by mouth 2 (two) times daily.    [provider]  rOPINIRole  (REQUIP ) 0.25 MG tablet TAKE 1 TABLET(0.25 MG) BY MOUTH AT BEDTIME 06/10/23   Amadeo June, MD  senna-docusate (SENOKOT-S) 8.6-50 MG tablet Take 1 tablet by mouth 2 (two) times daily. 06/26/23   Amadeo June, MD  spironolactone  (ALDACTONE ) 25 MG tablet Take 1 tablet (25 mg total) by mouth daily. Patient not taking: Reported on 06/27/2023 06/20/23   Amadeo June, MD  spironolactone  (ALDACTONE ) 50 MG tablet Take 1 tablet (50 mg total) by mouth daily. 06/26/23 07/26/23  Arvilla Birmingham, MD  tamsulosin  (FLOMAX ) 0.4 MG CAPS capsule Take 1 capsule (0.4 mg total) by mouth daily. 09/10/22   Faith Homes, MD  TOBRADEX ophthalmic ointment Place 1 Application into the left eye daily as needed. 05/02/23   [provider]  venlafaxine  XR (EFFEXOR -XR) 75 MG 24 hr capsule Take 1 capsule (75 mg total) by mouth daily with breakfast. 04/21/23   Amadeo June, MD  Vitamin D, Ergocalciferol, (DRISDOL) 50000 UNITS CAPS Take 50,000 Units by mouth every Wednesday.    [provider]    Physical Exam: Vitals:   06/28/23 1745 06/28/23 1905 06/28/23 1940 06/28/23 2147  BP: 121/70   128/65  Pulse: 79   73  Resp: 19   18  Temp:  98 F (36.7 C)  98.4 F (36.9 C)  TempSrc:  Oral  Oral  SpO2: 99%   100%  Weight:   96.6 kg   Height:   5' 2 (1.575 m)    Constitutional: Chronically ill-appearing woman resting in bed with head elevated.  NAD. 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 Posterior pharynx clear of any exudate or lesions.Normal dentition.  Neck: normal, supple, no masses. Respiratory: clear to auscultation bilaterally, no wheezing, no crackles. Normal respiratory effort. No accessory muscle use.  Cardiovascular: Regular rate and rhythm, systolic murmur. No  extremity edema. 2+ pedal pulses. Abdomen: no tenderness, no masses palpated. Musculoskeletal: no clubbing / cyanosis. No joint deformity upper and lower extremities. Good ROM, no contractures. Normal muscle tone.  Skin: no rashes, lesions, ulcers. No induration Neurologic: Sensation intact. Strength equal bilaterally. Psychiatric:  Alert and oriented to self, place, not year.  EKG: Personally reviewed. Sinus rhythm, rate 67, no acute ischemic changes.  Assessment/Plan Principal Problem:   Acute hepatic encephalopathy (HCC) Active Problems:   Hepatic cirrhosis (HCC)   Hypothyroidism   Short gut syndrome   Restless leg syndrome   Anemia of chronic disease   Chronic kidney disease, stage 3a (HCC)   Generalized anxiety disorder   [name] is a 68 y.o. female with medical history significant for [name] cirrhosis with recurrent hepatic encephalopathy, CKD stage IIIa, iron  deficiency anemia, hypothyroidism, GAD, RLS, chronic abdominal pain, short gut syndrome, and chronic diarrhea who is admitted with acute hepatic encephalopathy.  Assessment and Plan: NASH Cirrhosis with hepatic encephalopathy: Recurrent issue with ammonia 127 on admission.  CT imaging negative for acute process. - Continue lactulose  30 g 3 times daily, hold for excess bowel movements - Continue rifaximin  550 mg twice daily - Continue spironolactone  50 mg daily Continue Lasix  40 mg daily - Continue midodrine  10 mg 3 times daily for BP support  Anemia of chronic disease and iron  deficiency: Hemoglobin stable at 9.7.  Continue monitor.  Short gut syndrome Chronic diarrhea Chronic abdominal pain: CT abdomen negative for acute process.  Continue  Bentyl  as needed.  CKD stage IIIa: Renal function stable, monitor.  Hypothyroidism: Continue Synthroid .  GAD: Continue venlafaxine .  Restless leg syndrome: Continue ropinirole .   DVT prophylaxis: heparin  injection 5,000 Units Start: 06/28/23 2200 Code Status:    Code Status: Do not attempt resuscitation (DNR) PRE-ARREST INTERVENTIONS DESIRED confirmed with patient and her son on admission. Family Communication: Son at bedside Disposition Plan: From home, dispo pending clinical progress Consults called: None Severity of Illness: The appropriate patient status for this patient is OBSERVATION. Observation status is judged to be reasonable and necessary in order to provide the required intensity of service to ensure the patient's safety. The patient's presenting symptoms, physical exam findings, and initial radiographic and laboratory data in the context of their medical condition is felt to place them at decreased risk for further clinical deterioration. Furthermore, it is anticipated that the patient will be medically stable for discharge from the hospital within 2 midnights of admission.   Edith Gores MD [facility]  If 7PM-7AM, please contact night-coverage [email or web address]  06/28/2023, 10:40 PM

## 2023-06-29 DIAGNOSIS — E039 Hypothyroidism, unspecified: Secondary | ICD-10-CM | POA: Diagnosis present

## 2023-06-29 DIAGNOSIS — K7682 Hepatic encephalopathy: Principal | ICD-10-CM | POA: Diagnosis present

## 2023-06-29 DIAGNOSIS — E669 Obesity, unspecified: Secondary | ICD-10-CM | POA: Diagnosis present

## 2023-06-29 DIAGNOSIS — K90829 Short bowel syndrome, unspecified: Secondary | ICD-10-CM | POA: Diagnosis present

## 2023-06-29 DIAGNOSIS — K7460 Unspecified cirrhosis of liver: Secondary | ICD-10-CM | POA: Diagnosis present

## 2023-06-29 DIAGNOSIS — K7581 Nonalcoholic steatohepatitis (NASH): Secondary | ICD-10-CM | POA: Diagnosis present

## 2023-06-29 DIAGNOSIS — E785 Hyperlipidemia, unspecified: Secondary | ICD-10-CM | POA: Diagnosis present

## 2023-06-29 DIAGNOSIS — K766 Portal hypertension: Secondary | ICD-10-CM | POA: Diagnosis present

## 2023-06-29 DIAGNOSIS — I129 Hypertensive chronic kidney disease with stage 1 through stage 4 chronic kidney disease, or unspecified chronic kidney disease: Secondary | ICD-10-CM | POA: Diagnosis present

## 2023-06-29 DIAGNOSIS — D631 Anemia in chronic kidney disease: Secondary | ICD-10-CM | POA: Diagnosis present

## 2023-06-29 DIAGNOSIS — N200 Calculus of kidney: Secondary | ICD-10-CM | POA: Diagnosis present

## 2023-06-29 DIAGNOSIS — Z66 Do not resuscitate: Secondary | ICD-10-CM | POA: Diagnosis present

## 2023-06-29 DIAGNOSIS — Z888 Allergy status to other drugs, medicaments and biological substances status: Secondary | ICD-10-CM | POA: Diagnosis not present

## 2023-06-29 DIAGNOSIS — D509 Iron deficiency anemia, unspecified: Secondary | ICD-10-CM | POA: Diagnosis present

## 2023-06-29 DIAGNOSIS — R161 Splenomegaly, not elsewhere classified: Secondary | ICD-10-CM | POA: Diagnosis present

## 2023-06-29 DIAGNOSIS — G2581 Restless legs syndrome: Secondary | ICD-10-CM | POA: Diagnosis present

## 2023-06-29 DIAGNOSIS — K529 Noninfective gastroenteritis and colitis, unspecified: Secondary | ICD-10-CM | POA: Diagnosis present

## 2023-06-29 DIAGNOSIS — N1831 Chronic kidney disease, stage 3a: Secondary | ICD-10-CM | POA: Diagnosis present

## 2023-06-29 DIAGNOSIS — H5462 Unqualified visual loss, left eye, normal vision right eye: Secondary | ICD-10-CM | POA: Diagnosis present

## 2023-06-29 DIAGNOSIS — F411 Generalized anxiety disorder: Secondary | ICD-10-CM | POA: Diagnosis present

## 2023-06-29 DIAGNOSIS — I672 Cerebral atherosclerosis: Secondary | ICD-10-CM | POA: Diagnosis present

## 2023-06-29 DIAGNOSIS — J9811 Atelectasis: Secondary | ICD-10-CM | POA: Diagnosis present

## 2023-06-29 DIAGNOSIS — Z880 Allergy status to penicillin: Secondary | ICD-10-CM | POA: Diagnosis not present

## 2023-06-29 DIAGNOSIS — G8929 Other chronic pain: Secondary | ICD-10-CM | POA: Diagnosis present

## 2023-06-29 DIAGNOSIS — M79661 Pain in right lower leg: Secondary | ICD-10-CM | POA: Diagnosis not present

## 2023-06-29 LAB — COMPREHENSIVE METABOLIC PANEL WITH GFR
ALT: 20 U/L (ref 0–44)
AST: 28 U/L (ref 15–41)
Albumin: 2.1 g/dL — ABNORMAL LOW (ref 3.5–5.0)
Alkaline Phosphatase: 93 U/L (ref 38–126)
Anion gap: 8 (ref 5–15)
BUN: 24 mg/dL — ABNORMAL HIGH (ref 8–23)
CO2: 20 mmol/L — ABNORMAL LOW (ref 22–32)
Calcium: 8 mg/dL — ABNORMAL LOW (ref 8.9–10.3)
Chloride: 110 mmol/L (ref 98–111)
Creatinine, Ser: 1.13 mg/dL — ABNORMAL HIGH (ref 0.44–1.00)
GFR, Estimated: 54 mL/min — ABNORMAL LOW (ref >60)
Glucose, Bld: 103 mg/dL — ABNORMAL HIGH (ref 70–99)
Potassium: 3.7 mmol/L (ref 3.5–5.1)
Sodium: 138 mmol/L (ref 135–145)
Total Bilirubin: 1.5 mg/dL — ABNORMAL HIGH (ref 0.0–1.2)
Total Protein: 5.4 g/dL — ABNORMAL LOW (ref 6.5–8.1)

## 2023-06-29 LAB — CBC
HCT: 27.9 % — ABNORMAL LOW (ref 36.0–46.0)
Hemoglobin: 8.4 g/dL — ABNORMAL LOW (ref 12.0–15.0)
MCH: 29.5 pg (ref 26.0–34.0)
MCHC: 30.1 g/dL (ref 30.0–36.0)
MCV: 97.9 fL (ref 80.0–100.0)
Platelets: 141 10*3/uL — ABNORMAL LOW (ref 150–400)
RBC: 2.85 MIL/uL — ABNORMAL LOW (ref 3.87–5.11)
RDW: 16.4 % — ABNORMAL HIGH (ref 11.5–15.5)
WBC: 4.8 10*3/uL (ref 4.0–10.5)
nRBC: 0 % (ref 0.0–0.2)

## 2023-06-29 MED ORDER — MELATONIN 5 MG PO TABS
5.0000 mg | ORAL_TABLET | Freq: Every evening | ORAL | Status: DC | PRN
  Administered 2023-06-29 (×2): 5 mg via ORAL
  Filled 2023-06-29 (×2): qty 1

## 2023-06-29 MED ORDER — SODIUM BICARBONATE 650 MG PO TABS
650.0000 mg | ORAL_TABLET | Freq: Two times a day (BID) | ORAL | Status: DC
  Administered 2023-06-29 – 2023-07-05 (×13): 650 mg via ORAL
  Filled 2023-06-29 (×13): qty 1

## 2023-06-29 MED ORDER — HYDROCODONE-ACETAMINOPHEN 10-325 MG PO TABS
1.0000 | ORAL_TABLET | Freq: Three times a day (TID) | ORAL | Status: AC | PRN
  Administered 2023-06-29 – 2023-06-30 (×2): 1 via ORAL
  Filled 2023-06-29 (×2): qty 1

## 2023-06-29 NOTE — Telephone Encounter (Signed)
 Copied from CRM [id number]. Topic: Clinical - Medication Refill >> Jun 29, 2023  9:16 AM Lizabeth Riggs wrote: Medication: midodrine  (PROAMATINE ) 10 MG tablet   Has the patient contacted their pharmacy? Yes (Agent: If no, request that the patient contact the pharmacy for the refill. If patient does not wish to contact the pharmacy document the reason why and proceed with request.) (Agent: If yes, when and what did the pharmacy advise?) Pharmacy needs order to refill  This is the patient's preferred pharmacy:  [id number] [place] [place] - [address] AT [facility] [address] [place] [address] Phone: [phone] Fax: [phone]  Is this the correct pharmacy for this prescription? Yes If no, delete pharmacy and type the correct one.   Has the prescription been filled recently? No  Is the patient out of the medication? Yes  Has the patient been seen for an appointment in the last year OR does the patient have an upcoming appointment? Yes  Can we respond through MyChart? Yes  Agent: Please be advised that Rx refills may take up to 3 business days. We ask that you follow-up with your pharmacy.

## 2023-06-29 NOTE — TOC Initial Note (Addendum)
 Transition of Care [facility]) - Initial/Assessment Note    Patient Details  Name: [name] MRN: [id number] Date of Birth: August 16, 1955  Transition of Care [facility] CM/SW Contact:    Kathryn Parish, RN Phone Number: 06/29/2023,   Clinical Narrative:                 MOON completed. CM spoke with patient and son [name] in the room. Patient presented with acute hepatic encephalopathy. PTA from home with son. PCP/insurance verified;Pt has the following DME- walker, rollator, wheelchair, shower chair, hospital bed, hoyer denies further DME needs. No HH or SDOH needs;Pt's son to transport home at discharge.  TOC will continue to follow.   Expected Discharge Plan: Home w Home Health Services Barriers to Discharge: Continued Medical Work up   Patient Goals and CMS Choice Patient states their goals for this hospitalization and ongoing recovery are:: Home with son CMS Medicare.gov Compare Post Acute Care list provided to::  (NA) Choice offered to / list presented to : NA [place] ownership interest in [facility].provided to:: Parent NA    Expected Discharge Plan and Services In-house Referral: NA Discharge Planning Services: CM Consult Post Acute Care Choice: NA Living arrangements for the past 2 months: Single Family Home                 DME Arranged: N/A DME Agency: NA       HH Arranged: NA HH Agency: NA        Prior Living Arrangements/Services Living arrangements for the past 2 months: Single Family Home Lives with:: Adult Children Patient language and need for interpreter reviewed:: Yes Do you feel safe going back to the place where you live?: Yes      Need for Family Participation in Patient Care: Yes (Comment) Care giver support system in place?: Yes (comment) Current home services: DME Otho Blitz, rollator, wheelchair, shower chair, hospital bed, hoyer lift.) Criminal Activity/Legal Involvement Pertinent to Current Situation/Hospitalization: No - Comment as  needed  Activities of Daily Living   ADL Screening (condition at time of admission) Independently performs ADLs?: Yes (appropriate for developmental age) Is the patient deaf or have difficulty hearing?: No Does the patient have difficulty seeing, even when wearing glasses/contacts?: No Does the patient have difficulty concentrating, remembering, or making decisions?: No  Permission Sought/Granted Permission sought to share information with : Case Manager Permission granted to share information with : Yes, Verbal Permission Granted  Share Information with NAME: [name]     Permission granted to share info w Relationship: son  Permission granted to share info w Contact Information: [phone]  Emotional Assessment Appearance:: Appears older than stated age Attitude/Demeanor/Rapport: Lethargic Affect (typically observed): Calm Orientation: : Oriented to Self, Oriented to Place, Oriented to  Time, Oriented to Situation Alcohol / Substance Use: Not Applicable Psych Involvement: No (comment)  Admission diagnosis:  Hepatic encephalopathy (HCC) [K76.82] Acute hepatic encephalopathy (HCC) [K76.82] Patient Active Problem List   Diagnosis Date Noted   Chronic kidney disease, stage 3a (HCC) 06/28/2023   AKI (acute kidney injury) (HCC) 05/23/2023   Hyperammonemia (HCC) 05/23/2023   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) 05/23/2023   Chronic idiopathic thrombocytopenia (HCC) 05/23/2023   Chronic abdominal pain 05/23/2023   Hyperlipidemia 05/23/2023   Generalized anxiety disorder 05/23/2023   Chronic anemia 05/23/2023   Acute metabolic encephalopathy 05/23/2023   Copper  deficiency 04/24/2023   Sepsis (HCC) 04/06/2023   Cellulitis of right lower extremity 04/06/2023   ARF (acute renal failure) (HCC) 03/30/2023  Depression 03/30/2023   Restless leg syndrome 03/30/2023   Pruritus 03/06/2023   Edema of both lower legs 03/06/2023   Insomnia 03/06/2023   Compression fracture of body of  thoracic vertebra (HCC) 03/06/2023   Anemia of chronic disease 02/22/2023   Lumbar radiculopathy 12/10/2022   Grade I diastolic dysfunction 12/10/2022   History of DVT (deep vein thrombosis) 12/01/2022   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   Acute hepatic encephalopathy (HCC) 10/17/2022   Hypomagnesemia 10/08/2022   DNR (do not resuscitate)/DNI(Do Not intubate) 10/05/2022   Vaginal bleeding 10/05/2022   Gram negative septicemia (HCC) 10/01/2022   Physical deconditioning 08/17/2022   Chronic hypotension 08/11/2022   Unilateral primary osteoarthritis, left knee 08/01/2022   Folate deficiency 06/14/2022   Systolic murmur 06/12/2022   Iron  deficiency anemia 05/16/2022   Thrombocytopenia (HCC) - Baseline 100-130K 05/16/2022   History of eye surgery 05/01/2022   Corneal abnormality 04/29/2022   Status post eye surgery 04/29/2022   Corneal perforation of left eye 04/18/2022   Pseudophakia of left eye 02/08/2022   Physical debility 12/10/2021   Postmenopausal 11/24/2021   Hepatic cirrhosis (HCC) 10/03/2021   Polypharmacy 06/22/2021   Former smoker 01/06/2021   Combined form of age-related cataract, right eye 10/01/2020   Corneal scarring 09/10/2020   Cerebral atherosclerosis 08/20/2020   Short gut syndrome 08/20/2020   DDD (degenerative disc disease), lumbar 08/20/2020   Abdominal wall hernia 03/06/2020   Splenomegaly 02/06/2019   Vitamin B12 deficiency 01/21/2019   Hyperuricemia 08/25/2016   GERD (gastroesophageal reflux disease) 07/25/2016   Hypothyroidism 07/25/2016   Microcytic anemia 04/12/2016   Vitamin D deficiency 05/04/2015   History of adenomatous polyp of colon 04/28/2015   H/O angioedema 11/15/2014   Chronic diarrhea 06/01/2014   PCP:  Amadeo June, MD Pharmacy:   [facility] [phone] - [place], [place] - [address] AT [facility] [address] [place] [address] Phone: [phone] Fax: [phone]     Social Drivers of  Health (SDOH) Social History: SDOH Screenings   Food Insecurity: No Food Insecurity (06/28/2023)  Housing: Low Risk  (06/28/2023)  Transportation Needs: No Transportation Needs (06/28/2023)  Utilities: Not At Risk (06/28/2023)  Depression (PHQ2-9): Medium Risk (03/06/2023)  Financial Resource Strain: Low Risk  (06/09/2023)  Social Connections: Unknown (06/28/2023)  Recent Concern: Social Connections - Socially Isolated (04/06/2023)  Tobacco Use: Medium Risk (06/28/2023)   SDOH Interventions:     Readmission Risk Interventions    04/07/2023    1:23 PM 03/09/2023   11:21 AM 10/19/2022   10:12 AM  Readmission Risk Prevention Plan  Transportation Screening Complete Complete Complete  Medication Review Oceanographer) Complete Complete Complete  PCP or Specialist appointment within 3-5 days of discharge  Complete Complete  HRI or Home Care Consult Complete Complete Complete  SW Recovery Care/Counseling Consult Complete Complete Complete  Palliative Care Screening Not Applicable Not Applicable Not Applicable  Skilled Nursing Facility Not Applicable Not Applicable Not Applicable

## 2023-06-29 NOTE — Telephone Encounter (Signed)
 Requested medications are due for refill today.  no  Requested medications are on the active medications list.  yes  Last refill. 06/26/2023 #90 3 rf  Future visit scheduled.   yes  Notes to clinic.  Refill/refusal not delegated.    Requested Prescriptions  Pending Prescriptions Disp Refills   midodrine  (PROAMATINE ) 10 MG tablet 90 tablet 3     Not Delegated - Cardiovascular: Midodrine  Failed - 06/29/2023 10:21 AM      Failed - This refill cannot be delegated      Failed - Cr in normal range and within 360 days    Creat  Date Value Ref Range Status  04/24/2023 1.03 0.50 - 1.05 mg/dL Final   Creatinine, Ser  Date Value Ref Range Status  06/29/2023 1.13 (H) 0.44 - 1.00 mg/dL Final   Creatinine, Urine  Date Value Ref Range Status  05/23/2023 39 mg/dL Final    Comment:    Performed at [facility], [address] [place], [place] [address]         Failed - Valid encounter within last 12 months    Recent Outpatient Visits           2 months ago Edema of both lower legs   [place] [facility] Amadeo June, MD   3 months ago Pruritus   [place] [facility] Amadeo June, MD              Passed - ALT in normal range and within 360 days    ALT  Date Value Ref Range Status  06/29/2023 20 0 - 44 U/L Final         Passed - AST in normal range and within 360 days    AST  Date Value Ref Range Status  06/29/2023 28 15 - 41 U/L Final         Passed - Last BP in normal range    BP Readings from Last 1 Encounters:  06/29/23 (!) 125/56

## 2023-06-29 NOTE — Care Management Obs Status (Signed)
 MEDICARE OBSERVATION STATUS NOTIFICATION   Patient Details  Name: [name] MRN: [id number] Date of Birth: 07/11/1955   Medicare Observation Status Notification Given:  Yes    Kathryn Parish, RN 06/29/2023, 1:03 PM

## 2023-06-29 NOTE — Progress Notes (Signed)
 Progress Note   Patient: [name] ZOX:[id number] DOB: 03-20-1955 DOA: 06/28/2023     0 DOS: the patient was seen and examined on 06/29/2023   Brief hospital course: [name] is a 68 y.o. female with medical history significant for [name] cirrhosis with recurrent hepatic encephalopathy, CKD stage IIIa, iron  deficiency anemia, hypothyroidism, GAD, RLS, chronic abdominal pain, short gut syndrome, and chronic diarrhea who is admitted with acute hepatic encephalopathy.  Assessment and Plan: Principal Problem:   Acute hepatic encephalopathy (HCC) Active Problems:   Hepatic cirrhosis (HCC)   Hypothyroidism   Short gut syndrome   Restless leg syndrome   Anemia of chronic disease   Chronic kidney disease, stage 3a (HCC)   Generalized anxiety disorder   [name] is a 68 y.o. female with medical history significant for [name] cirrhosis with recurrent hepatic encephalopathy, CKD stage IIIa, iron  deficiency anemia, hypothyroidism, GAD, RLS, chronic abdominal pain, short gut syndrome, and chronic diarrhea who is admitted with acute hepatic encephalopathy.   Assessment and Plan: NASH Cirrhosis with hepatic encephalopathy: Recurrent issue with ammonia 127 on admission.  CT imaging negative for acute process. - The patient is awake and alert today, but remains somewhat confused. She is unable to tell me where she is, although she got the date right. Her son who is at bedside states that she is not back to her baseline. She has had 4 BM's by the end of the day today. - Continue lactulose  30 g 3 times daily, hold for excess bowel movements - Continue rifaximin  550 mg twice daily - Continue spironolactone  50 mg daily Continue Lasix  40 mg daily - Continue midodrine  10 mg 3 times daily for BP support   Anemia of chronic disease and iron  deficiency: Hemoglobin stable at 9.7.  Continue monitor.   Short gut syndrome Chronic diarrhea Chronic abdominal pain: CT abdomen negative for acute  process.  Continue Bentyl  as needed.   CKD stage IIIa: Renal function stable, monitor.   Hypothyroidism: Continue Synthroid .   GAD: Continue venlafaxine .   Restless leg syndrome: Continue ropinirole .   DVT prophylaxis: heparin  injection 5,000 Units Start: 06/28/23 2200 Code Status:   Code Status: Do not attempt resuscitation (DNR) PRE-ARREST INTERVENTIONS DESIRED confirmed with patient and her son on admission. Family Communication: Son at bedside Disposition Plan: From home, dispo pending clinical progress       Subjective: The patient is resting comfortably. No new complaints.   Physical Exam: Vitals:   06/29/23 0219 06/29/23 0500 06/29/23 0610 06/29/23 0921  BP: (!) 118/55  (!) 114/58 (!) 125/56  Pulse: 79  67 72  Resp: 20  20   Temp: 98.9 F (37.2 C)  98.8 F (37.1 C) 98.5 F (36.9 C)  TempSrc:      SpO2: 96%  100% 100%  Weight:  95.7 kg    Height:       Exam:  Constitutional:  The patient is awake, alert, and oriented x 2. No acute distress. Respiratory:  No increased work of breathing. No wheezes, rales, or rhonchi No tactile fremitus Cardiovascular:  Regular rate and rhythm No murmurs, ectopy, or gallups. No lateral PMI. No thrills. Abdomen:  Abdomen is soft, non-tender, non-distended No hernias, masses, or organomegaly Normoactive bowel sounds.  Musculoskeletal:  No cyanosis, clubbing, or edema Skin:  No rashes, lesions, ulcers palpation of skin: no induration or nodules Neurologic:  CN 2-12 intact Sensation all 4 extremities intact Psychiatric:  Mental status Mood, affect appropriate Orientation to person, place, time  judgment  and insight appear intact  Data Reviewed:  CBC CMP Ammonia  Family Communication: Son is at bedside. All questions answered to the best of my ability.  Disposition: Status is: Inpatient Remains inpatient appropriate because: Need to carefully monitor the patient's sensorium and electrolytes as well as  volume status due to her frequent stooling.   Planned Discharge Destination: Home    Time spent: 38 minutes  Author: Chynna Buerkle, DO 06/29/2023 6:32 PM  For on call review [email or web address].

## 2023-06-29 NOTE — Plan of Care (Signed)

## 2023-06-30 DIAGNOSIS — K7682 Hepatic encephalopathy: Secondary | ICD-10-CM | POA: Diagnosis not present

## 2023-06-30 LAB — CBC WITH DIFFERENTIAL/PLATELET
Abs Immature Granulocytes: 0.01 10*3/uL (ref 0.00–0.07)
Basophils Absolute: 0 10*3/uL (ref 0.0–0.1)
Basophils Relative: 1 %
Eosinophils Absolute: 0.3 10*3/uL (ref 0.0–0.5)
Eosinophils Relative: 7 %
HCT: 29.8 % — ABNORMAL LOW (ref 36.0–46.0)
Hemoglobin: 9 g/dL — ABNORMAL LOW (ref 12.0–15.0)
Immature Granulocytes: 0 %
Lymphocytes Relative: 34 %
Lymphs Abs: 1.6 10*3/uL (ref 0.7–4.0)
MCH: 29.7 pg (ref 26.0–34.0)
MCHC: 30.2 g/dL (ref 30.0–36.0)
MCV: 98.3 fL (ref 80.0–100.0)
Monocytes Absolute: 0.4 10*3/uL (ref 0.1–1.0)
Monocytes Relative: 9 %
Neutro Abs: 2.2 10*3/uL (ref 1.7–7.7)
Neutrophils Relative %: 49 %
Platelets: 147 10*3/uL — ABNORMAL LOW (ref 150–400)
RBC: 3.03 MIL/uL — ABNORMAL LOW (ref 3.87–5.11)
RDW: 16.3 % — ABNORMAL HIGH (ref 11.5–15.5)
WBC: 4.6 10*3/uL (ref 4.0–10.5)
nRBC: 0 % (ref 0.0–0.2)

## 2023-06-30 LAB — COMPREHENSIVE METABOLIC PANEL WITH GFR
ALT: 21 U/L (ref 0–44)
AST: 29 U/L (ref 15–41)
Albumin: 2.2 g/dL — ABNORMAL LOW (ref 3.5–5.0)
Alkaline Phosphatase: 92 U/L (ref 38–126)
Anion gap: 8 (ref 5–15)
BUN: 22 mg/dL (ref 8–23)
CO2: 20 mmol/L — ABNORMAL LOW (ref 22–32)
Calcium: 8.3 mg/dL — ABNORMAL LOW (ref 8.9–10.3)
Chloride: 112 mmol/L — ABNORMAL HIGH (ref 98–111)
Creatinine, Ser: 0.99 mg/dL (ref 0.44–1.00)
GFR, Estimated: >60 mL/min (ref >60)
Glucose, Bld: 108 mg/dL — ABNORMAL HIGH (ref 70–99)
Potassium: 4.1 mmol/L (ref 3.5–5.1)
Sodium: 140 mmol/L (ref 135–145)
Total Bilirubin: 1.6 mg/dL — ABNORMAL HIGH (ref 0.0–1.2)
Total Protein: 5.4 g/dL — ABNORMAL LOW (ref 6.5–8.1)

## 2023-06-30 LAB — AMMONIA: Ammonia: 109 umol/L — ABNORMAL HIGH (ref 9–35)

## 2023-06-30 MED ORDER — HYDROCODONE-ACETAMINOPHEN 5-325 MG PO TABS
1.0000 | ORAL_TABLET | Freq: Once | ORAL | Status: AC
  Administered 2023-06-30: 1 via ORAL
  Filled 2023-06-30: qty 1

## 2023-06-30 NOTE — Progress Notes (Signed)
 Progress Note   Patient: [name] FMW:[id number] DOB: Dec 19, 1955 DOA: 06/28/2023     1 DOS: the patient was seen and examined on 06/30/2023   Brief hospital course: [name] is a 68 y.o. female with medical history significant for [name] with recurrent hepatic encephalopathy, CKD stage IIIa, iron  deficiency anemia, hypothyroidism, GAD, RLS, chronic abdominal pain, short gut syndrome, and chronic diarrhea who is admitted with acute hepatic encephalopathy.  On 06/30/2023 the patient continues to be confused. She is unable to tell me her location or why she is here. She does not know the date today.  Assessment and Plan: Principal Problem:   Acute hepatic encephalopathy (HCC) Active Problems:   Hepatic cirrhosis (HCC)   Hypothyroidism   Short gut syndrome   Restless leg syndrome   Anemia of chronic disease   Chronic kidney disease, stage 3a (HCC)   Generalized anxiety disorder   [name] is a 68 y.o. female with medical history significant for [name] with recurrent hepatic encephalopathy, CKD stage IIIa, iron  deficiency anemia, hypothyroidism, GAD, RLS, chronic abdominal pain, short gut syndrome, and chronic diarrhea who is admitted with acute hepatic encephalopathy.   Assessment and Plan: NASH Cirrhosis with hepatic encephalopathy: Recurrent issue with ammonia 127 on admission.  CT imaging negative for acute process. - The patient is awake and alert today, but remains somewhat confused. She is unable to tell me where she is, although she got the date right. Her son who is at bedside states that she is not back to her baseline. She has had 4 BM's by the end of the day today. - Continue lactulose  30 g 3 times daily, hold for excess bowel movements - Continue rifaximin  550 mg twice daily - Continue spironolactone  50 mg daily Continue Lasix  40 mg daily - Continue midodrine  10 mg 3 times daily for BP support   Anemia of chronic disease and iron   deficiency: Hemoglobin stable at 9.7.  Continue monitor.   Short gut syndrome Chronic diarrhea Chronic abdominal pain: CT abdomen negative for acute process.  Continue Bentyl  as needed.   CKD stage IIIa: Renal function stable, monitor.   Hypothyroidism: Continue Synthroid .   GAD: Continue venlafaxine .   Restless leg syndrome: Continue ropinirole .   DVT prophylaxis: heparin  injection 5,000 Units Start: 06/28/23 2200 Code Status:   Code Status: Do not attempt resuscitation (DNR) PRE-ARREST INTERVENTIONS DESIRED confirmed with patient and her son on admission. Family Communication: Son at bedside Disposition Plan: From home, dispo pending clinical progress       Subjective: The patient is resting comfortably. No new complaints.   Physical Exam: Vitals:   06/29/23 2003 06/30/23 0417 06/30/23 0706 06/30/23 1727  BP: (!) 123/57 136/68  130/63  Pulse: 63 70  62  Resp: 17 18  18   Temp: 98.1 F (36.7 C) 97.7 F (36.5 C)  98 F (36.7 C)  TempSrc: Oral   Oral  SpO2:  98%  98%  Weight:   95 kg   Height:       Exam:  Constitutional:  The patient is awake, alert, and oriented x 2. No acute distress. Respiratory:  No increased work of breathing. No wheezes, rales, or rhonchi No tactile fremitus Cardiovascular:  Regular rate and rhythm No murmurs, ectopy, or gallups. No lateral PMI. No thrills. Abdomen:  Abdomen is soft, non-tender, non-distended No hernias, masses, or organomegaly Normoactive bowel sounds.  Musculoskeletal:  No cyanosis, clubbing, or edema Skin:  No rashes, lesions, ulcers palpation of skin: no  induration or nodules Neurologic:  CN 2-12 intact Sensation all 4 extremities intact Psychiatric:  Mental status Mood, affect appropriate Orientation to person, place, time  judgment and insight appear intact  Data Reviewed:  CBC CMP Ammonia  Family Communication: Son is at bedside. All questions answered to the best of my  ability.  Disposition: Status is: Inpatient Remains inpatient appropriate because: Need to carefully monitor the patient's sensorium and electrolytes as well as volume status due to her frequent stooling.   Planned Discharge Destination: Home    Time spent: 32 minutes  Author: Wonder Donaway, DO 06/30/2023 7:18 PM  For on call review [email or web address].

## 2023-07-01 DIAGNOSIS — K7682 Hepatic encephalopathy: Secondary | ICD-10-CM | POA: Diagnosis not present

## 2023-07-01 LAB — BASIC METABOLIC PANEL WITH GFR
Anion gap: 8 (ref 5–15)
BUN: 22 mg/dL (ref 8–23)
CO2: 22 mmol/L (ref 22–32)
Calcium: 8.6 mg/dL — ABNORMAL LOW (ref 8.9–10.3)
Chloride: 110 mmol/L (ref 98–111)
Creatinine, Ser: 1.13 mg/dL — ABNORMAL HIGH (ref 0.44–1.00)
GFR, Estimated: 54 mL/min — ABNORMAL LOW (ref >60)
Glucose, Bld: 88 mg/dL (ref 70–99)
Potassium: 4.6 mmol/L (ref 3.5–5.1)
Sodium: 140 mmol/L (ref 135–145)

## 2023-07-01 LAB — CBC WITH DIFFERENTIAL/PLATELET
Abs Immature Granulocytes: 0.01 10*3/uL (ref 0.00–0.07)
Basophils Absolute: 0.1 10*3/uL (ref 0.0–0.1)
Basophils Relative: 1 %
Eosinophils Absolute: 0.4 10*3/uL (ref 0.0–0.5)
Eosinophils Relative: 7 %
HCT: 34.1 % — ABNORMAL LOW (ref 36.0–46.0)
Hemoglobin: 10.3 g/dL — ABNORMAL LOW (ref 12.0–15.0)
Immature Granulocytes: 0 %
Lymphocytes Relative: 35 %
Lymphs Abs: 1.9 10*3/uL (ref 0.7–4.0)
MCH: 29.9 pg (ref 26.0–34.0)
MCHC: 30.2 g/dL (ref 30.0–36.0)
MCV: 98.8 fL (ref 80.0–100.0)
Monocytes Absolute: 0.5 10*3/uL (ref 0.1–1.0)
Monocytes Relative: 9 %
Neutro Abs: 2.6 10*3/uL (ref 1.7–7.7)
Neutrophils Relative %: 48 %
Platelets: 164 10*3/uL (ref 150–400)
RBC: 3.45 MIL/uL — ABNORMAL LOW (ref 3.87–5.11)
RDW: 16.4 % — ABNORMAL HIGH (ref 11.5–15.5)
WBC: 5.5 10*3/uL (ref 4.0–10.5)
nRBC: 0 % (ref 0.0–0.2)

## 2023-07-01 LAB — AMMONIA: Ammonia: 63 umol/L — ABNORMAL HIGH (ref 9–35)

## 2023-07-01 MED ORDER — LIDOCAINE 5 % EX PTCH
1.0000 | MEDICATED_PATCH | Freq: Every day | CUTANEOUS | Status: DC
  Administered 2023-07-01 – 2023-07-05 (×5): 1 via TRANSDERMAL
  Filled 2023-07-01 (×5): qty 1

## 2023-07-01 MED ORDER — HYDROCODONE-ACETAMINOPHEN 10-325 MG PO TABS
1.0000 | ORAL_TABLET | Freq: Three times a day (TID) | ORAL | Status: AC | PRN
  Administered 2023-07-01 – 2023-07-02 (×2): 1 via ORAL
  Filled 2023-07-01 (×2): qty 1

## 2023-07-01 NOTE — Plan of Care (Signed)

## 2023-07-01 NOTE — Progress Notes (Signed)
 Progress Note   Patient: [name] FMW:[id number] DOB: May 28, 1955 DOA: 06/28/2023     2 DOS: the patient was seen and examined on 07/01/2023   Brief hospital course: [name] is a 68 y.o. female with medical history significant for [name] with recurrent hepatic encephalopathy, CKD stage IIIa, iron  deficiency anemia, hypothyroidism, GAD, RLS, chronic abdominal pain, short gut syndrome, and chronic diarrhea who is admitted with acute hepatic encephalopathy.  On 07/01/2023 the patient appears to have improved sensorium. However, her son is at bedside and says that she still has a long way to go. The patient is still a little confused, but I let them know that I thought that she was nearing time for discharge.   The patient is complaining of pain, but she has only used her hydrocodone  twice during this stay. Will also have PT/OT evaluate the patient.  Assessment and Plan: Principal Problem:   Acute hepatic encephalopathy (HCC) Active Problems:   Hepatic cirrhosis (HCC)   Hypothyroidism   Short gut syndrome   Restless leg syndrome   Anemia of chronic disease   Chronic kidney disease, stage 3a (HCC)   Generalized anxiety disorder   [name] is a 67 y.o. female with medical history significant for NASH cirrhosis with recurrent hepatic encephalopathy, CKD stage IIIa, iron  deficiency anemia, hypothyroidism, GAD, RLS, chronic abdominal pain, short gut syndrome, and chronic diarrhea who is admitted with acute hepatic encephalopathy.   Assessment and Plan: NASH Cirrhosis with hepatic encephalopathy: Recurrent issue with ammonia 127 on admission.  CT imaging negative for acute process. - The patient is improving with regard to her sensorium. She is having 3 BM's per day.  - Continue lactulose  30 g 3 times daily, hold for excess bowel movements. The patient is having 3 BM's per day with this. Ammonia level is decreasing daily, and she is awake and alert.  - Continue rifaximin   550 mg twice daily - Continue spironolactone  50 mg daily Continue Lasix  40 mg daily - Continue midodrine  10 mg 3 times daily for BP support   Anemia of chronic disease and iron  deficiency: Hemoglobin stable at 9.7.  Continue monitor.   Short gut syndrome Chronic diarrhea Chronic abdominal pain: CT abdomen negative for acute process.  Continue Bentyl  as needed.   CKD stage IIIa: Renal function stable, monitor.   Hypothyroidism: Continue Synthroid .   GAD: Continue venlafaxine .   Restless leg syndrome: Continue ropinirole .  Chronic pain: Patient is complaining of chronic pain in her back and her knees. She takes hydrocodone  for this pain at home. She is requesting Morphine  here, but she has only taken two doses of her hydrocodone  while she has been here.  Morphine  will be avoided as it may cause changes to the patient's mental status that will muddy the waters when trying to evaluate her hepatic encephalopathy.   DVT prophylaxis: heparin  injection 5,000 Units Start: 06/28/23 2200 Code Status:   Code Status: Do not attempt resuscitation (DNR) PRE-ARREST INTERVENTIONS DESIRED confirmed with patient and her son on admission. Family Communication: Son at bedside Disposition Plan: From home, dispo pending clinical progress       Subjective: The patient is resting comfortably. She is complaining of her chronic pain.  Physical Exam: Vitals:   07/01/23 0705 07/01/23 0920 07/01/23 1100 07/01/23 1644  BP:  (!) 123/54 (!) 106/56 135/66  Pulse:  63 72 64  Resp:   16   Temp:   98.2 F (36.8 C)   TempSrc:   Oral  SpO2:   98%   Weight: 95 kg     Height:       Exam:  Constitutional:  The patient is awake, alert, and oriented x 2. No acute distress. Respiratory:  No increased work of breathing. No wheezes, rales, or rhonchi No tactile fremitus Cardiovascular:  Regular rate and rhythm No murmurs, ectopy, or gallups. No lateral PMI. No thrills. Abdomen:  Abdomen is soft,  non-tender, non-distended No hernias, masses, or organomegaly Normoactive bowel sounds.  Musculoskeletal:  No cyanosis, clubbing, or edema Skin:  No rashes, lesions, ulcers palpation of skin: no induration or nodules Neurologic:  CN 2-12 intact Sensation all 4 extremities intact Psychiatric:  Mental status Mood, affect appropriate Orientation to person, place, time  judgment and insight appear intact  Data Reviewed:  CBC CMP Ammonia  Family Communication: Son is at bedside. All questions answered to the best of my ability.  Disposition: Status is: Inpatient Remains inpatient appropriate because: Need to carefully monitor the patient's sensorium and electrolytes as well as volume status due to her frequent stooling.   Planned Discharge Destination: Home    Time spent: 36 minutes  Author: Gari Trovato, DO 07/01/2023 5:16 PM  For on call review [email or web address].

## 2023-07-02 ENCOUNTER — Inpatient Hospital Stay (HOSPITAL_COMMUNITY): Payer: Medicare (Managed Care)

## 2023-07-02 DIAGNOSIS — K7682 Hepatic encephalopathy: Secondary | ICD-10-CM | POA: Diagnosis not present

## 2023-07-02 DIAGNOSIS — M79661 Pain in right lower leg: Secondary | ICD-10-CM | POA: Diagnosis not present

## 2023-07-02 LAB — CBC WITH DIFFERENTIAL/PLATELET
Abs Immature Granulocytes: 0.01 10*3/uL (ref 0.00–0.07)
Basophils Absolute: 0.1 10*3/uL (ref 0.0–0.1)
Basophils Relative: 1 %
Eosinophils Absolute: 0.3 10*3/uL (ref 0.0–0.5)
Eosinophils Relative: 8 %
HCT: 30.4 % — ABNORMAL LOW (ref 36.0–46.0)
Hemoglobin: 9 g/dL — ABNORMAL LOW (ref 12.0–15.0)
Immature Granulocytes: 0 %
Lymphocytes Relative: 35 %
Lymphs Abs: 1.6 10*3/uL (ref 0.7–4.0)
MCH: 28.9 pg (ref 26.0–34.0)
MCHC: 29.6 g/dL — ABNORMAL LOW (ref 30.0–36.0)
MCV: 97.7 fL (ref 80.0–100.0)
Monocytes Absolute: 0.5 10*3/uL (ref 0.1–1.0)
Monocytes Relative: 10 %
Neutro Abs: 2 10*3/uL (ref 1.7–7.7)
Neutrophils Relative %: 46 %
Platelets: 153 10*3/uL (ref 150–400)
RBC: 3.11 MIL/uL — ABNORMAL LOW (ref 3.87–5.11)
RDW: 16.5 % — ABNORMAL HIGH (ref 11.5–15.5)
WBC: 4.4 10*3/uL (ref 4.0–10.5)
nRBC: 0 % (ref 0.0–0.2)

## 2023-07-02 LAB — CULTURE, BLOOD (ROUTINE X 2)
Culture: NO GROWTH
Special Requests: ADEQUATE

## 2023-07-02 LAB — AMMONIA: Ammonia: 112 umol/L — ABNORMAL HIGH (ref 9–35)

## 2023-07-02 LAB — COMPREHENSIVE METABOLIC PANEL WITH GFR
ALT: 21 U/L (ref 0–44)
AST: 30 U/L (ref 15–41)
Albumin: 2.2 g/dL — ABNORMAL LOW (ref 3.5–5.0)
Alkaline Phosphatase: 94 U/L (ref 38–126)
Anion gap: 7 (ref 5–15)
BUN: 22 mg/dL (ref 8–23)
CO2: 21 mmol/L — ABNORMAL LOW (ref 22–32)
Calcium: 8.2 mg/dL — ABNORMAL LOW (ref 8.9–10.3)
Chloride: 111 mmol/L (ref 98–111)
Creatinine, Ser: 1 mg/dL (ref 0.44–1.00)
GFR, Estimated: >60 mL/min (ref >60)
Glucose, Bld: 109 mg/dL — ABNORMAL HIGH (ref 70–99)
Potassium: 4.5 mmol/L (ref 3.5–5.1)
Sodium: 139 mmol/L (ref 135–145)
Total Bilirubin: 1.7 mg/dL — ABNORMAL HIGH (ref 0.0–1.2)
Total Protein: 5.4 g/dL — ABNORMAL LOW (ref 6.5–8.1)

## 2023-07-02 LAB — PROTIME-INR
INR: 1.8 — ABNORMAL HIGH (ref 0.8–1.2)
Prothrombin Time: 21.2 s — ABNORMAL HIGH (ref 11.4–15.2)

## 2023-07-02 MED ORDER — HYDROCODONE-ACETAMINOPHEN 10-325 MG PO TABS
1.0000 | ORAL_TABLET | Freq: Three times a day (TID) | ORAL | Status: DC | PRN
  Administered 2023-07-02 – 2023-07-03 (×2): 1 via ORAL
  Filled 2023-07-02 (×2): qty 1

## 2023-07-02 NOTE — Progress Notes (Signed)
 Progress Note   Patient: [name] FMW:[id number] DOB: March 12, 1955 DOA: 06/28/2023     3 DOS: the patient was seen and examined on 07/02/2023   Brief hospital course: [name] is a 68 y.o. female with medical history significant for [name] with recurrent hepatic encephalopathy, CKD stage IIIa, iron  deficiency anemia, hypothyroidism, GAD, RLS, chronic abdominal pain, short gut syndrome, and chronic diarrhea who is admitted with acute hepatic encephalopathy.  On 07/01/2023 the patient appears to have improved sensorium. However, her son is at bedside and says that she still has a long way to go. The patient is still a little confused, but I let them know that I thought that she was nearing time for discharge.   The patient is complaining of pain, but she has only used her hydrocodone  twice during this stay. Will also have PT/OT evaluate the patient.  This morning nursing reported that the patient complained of pain and swelling in the backs of her legs. B/L lower extremity dopplers were ordered.  Assessment and Plan: Principal Problem:   Acute hepatic encephalopathy (HCC) Active Problems:   Hepatic cirrhosis (HCC)   Hypothyroidism   Short gut syndrome   Restless leg syndrome   Anemia of chronic disease   Chronic kidney disease, stage 3a (HCC)   Generalized anxiety disorder   [name] is a 68 y.o. female with medical history significant for NASH cirrhosis with recurrent hepatic encephalopathy, CKD stage IIIa, iron  deficiency anemia, hypothyroidism, GAD, RLS, chronic abdominal pain, short gut syndrome, and chronic diarrhea who is admitted with acute hepatic encephalopathy.   Assessment and Plan: NASH Cirrhosis with hepatic encephalopathy: Recurrent issue with ammonia 127 on admission.  CT imaging negative for acute process. - The patient is improving with regard to her sensorium. She is having 3 BM's per day.  - Continue lactulose  30 g 3 times daily, hold for excess  bowel movements. The patient is having 3 BM's per day with this. Ammonia level has been decreasing daily, until today when it increased again to 112. However, the patient continues to be awake and alert.  - Continue rifaximin  550 mg twice daily - Continue spironolactone  50 mg daily Continue Lasix  40 mg daily - Continue midodrine  10 mg 3 times daily for BP support  - on 6/22/22025 the patient has had 2 BM's so far today. She had 4 BM's over the past 24 hours.   Anemia of chronic disease and iron  deficiency: Hemoglobin stable at 9.7.  Continue monitor.   Short gut syndrome Chronic diarrhea Chronic abdominal pain: CT abdomen negative for acute process.  Continue Bentyl  as needed.   CKD stage IIIa: Renal function stable, monitor.  Pain and swelling of bilateral lower extremities: Dopplers of bilateral lower extremities were completed and were negative. I believe that the changes to the patient's lower extremities are chronic.   Hypothyroidism: Continue Synthroid .   GAD: Continue venlafaxine .   Restless leg syndrome: Continue ropinirole .  Chronic pain: Patient is complaining of chronic pain in her back and her knees. She takes hydrocodone  for this pain at home. She is requesting Morphine  here, but she has only taken two doses of her hydrocodone  while she has been here.  Morphine  will be avoided as it may cause changes to the patient's mental status that will muddy the waters when trying to evaluate her hepatic encephalopathy.   DVT prophylaxis: heparin  injection 5,000 Units Start: 06/28/23 2200 Code Status:   Code Status: Do not attempt resuscitation (DNR) PRE-ARREST INTERVENTIONS DESIRED confirmed  with patient and her son on admission. Family Communication: Son at bedside Disposition Plan: From home, dispo pending clinical progress  The patient has been seen and examined by me. I have spent 36 minutes in her evaluation and care.  Subjective: The patient is resting comfortably. She  is complaining of her chronic pain.  Physical Exam: Vitals:   07/01/23 1644 07/01/23 2039 07/02/23 0426 07/02/23 1203  BP: 135/66 (!) 116/55 (!) 133/56 133/66  Pulse: 64 63 68 66  Resp:  17 19 16   Temp:  98.2 F (36.8 C) 98.1 F (36.7 C) 98.3 F (36.8 C)  TempSrc:   Oral   SpO2:  97% 100% 100%  Weight:   96 kg   Height:       Exam:  Constitutional:  The patient is awake, alert, and oriented x 2. No acute distress. Respiratory:  No increased work of breathing. No wheezes, rales, or rhonchi No tactile fremitus Cardiovascular:  Regular rate and rhythm No murmurs, ectopy, or gallups. No lateral PMI. No thrills. Abdomen:  Abdomen is soft, non-tender, non-distended No hernias, masses, or organomegaly Normoactive bowel sounds.  Musculoskeletal:  No cyanosis, clubbing, or edema There is a firm, swelling on the posterior aspect of the patient's lower extremities bilaterally. The patient states that she has pain with palpation.  Skin:  No rashes, lesions, ulcers palpation of skin: no induration or nodules Neurologic:  CN 2-12 intact Sensation all 4 extremities intact Psychiatric:  Mental status Mood, affect appropriate Orientation to person, place, time  judgment and insight appear intact  Data Reviewed:  CBC CMP Ammonia  Family Communication: Son is at bedside. All questions answered to the best of my ability.  Disposition: Status is: Inpatient Remains inpatient appropriate because: Need to carefully monitor the patient's sensorium and electrolytes as well as volume status due to her frequent stooling.   Planned Discharge Destination: Home    Time spent: 36 minutes  Author: Shams Fill, DO 07/02/2023 6:10 PM  For on call review [email or web address].

## 2023-07-02 NOTE — Plan of Care (Signed)
  Problem: Education: Goal: Knowledge of General Education information will improve Description: Including pain rating scale, medication(s)/side effects and non-pharmacologic comfort measures Outcome: Progressing   Problem: Clinical Measurements: Goal: Will remain free from infection Outcome: Progressing   Problem: Elimination: Goal: Will not experience complications related to bowel motility Outcome: Progressing Goal: Will not experience complications related to urinary retention Outcome: Progressing   Problem: Pain Managment: Goal: General experience of comfort will improve and/or be controlled Outcome: Progressing   Problem: Safety: Goal: Ability to remain free from injury will improve Outcome: Progressing   Problem: Skin Integrity: Goal: Risk for impaired skin integrity will decrease Outcome: Progressing

## 2023-07-02 NOTE — Progress Notes (Signed)
 Bilateral lower extremity venous duplex has been completed. Preliminary results can be found in CV Proc through chart review.   07/02/23 4:09 PM Cathlyn Collet RVT

## 2023-07-03 DIAGNOSIS — K7682 Hepatic encephalopathy: Secondary | ICD-10-CM | POA: Diagnosis not present

## 2023-07-03 DIAGNOSIS — M25569 Pain in unspecified knee: Secondary | ICD-10-CM | POA: Insufficient documentation

## 2023-07-03 LAB — COMPREHENSIVE METABOLIC PANEL WITH GFR
ALT: 21 U/L (ref 0–44)
AST: 31 U/L (ref 15–41)
Albumin: 2.2 g/dL — ABNORMAL LOW (ref 3.5–5.0)
Alkaline Phosphatase: 96 U/L (ref 38–126)
Anion gap: 5 (ref 5–15)
BUN: 22 mg/dL (ref 8–23)
CO2: 23 mmol/L (ref 22–32)
Calcium: 8.4 mg/dL — ABNORMAL LOW (ref 8.9–10.3)
Chloride: 111 mmol/L (ref 98–111)
Creatinine, Ser: 1.09 mg/dL — ABNORMAL HIGH (ref 0.44–1.00)
GFR, Estimated: 56 mL/min — ABNORMAL LOW (ref >60)
Glucose, Bld: 99 mg/dL (ref 70–99)
Potassium: 4.6 mmol/L (ref 3.5–5.1)
Sodium: 139 mmol/L (ref 135–145)
Total Bilirubin: 1.9 mg/dL — ABNORMAL HIGH (ref 0.0–1.2)
Total Protein: 5.6 g/dL — ABNORMAL LOW (ref 6.5–8.1)

## 2023-07-03 LAB — CBC WITH DIFFERENTIAL/PLATELET
Abs Immature Granulocytes: 0.01 10*3/uL (ref 0.00–0.07)
Basophils Absolute: 0.1 10*3/uL (ref 0.0–0.1)
Basophils Relative: 1 %
Eosinophils Absolute: 0.4 10*3/uL (ref 0.0–0.5)
Eosinophils Relative: 9 %
HCT: 31.2 % — ABNORMAL LOW (ref 36.0–46.0)
Hemoglobin: 9.2 g/dL — ABNORMAL LOW (ref 12.0–15.0)
Immature Granulocytes: 0 %
Lymphocytes Relative: 35 %
Lymphs Abs: 1.6 10*3/uL (ref 0.7–4.0)
MCH: 29.8 pg (ref 26.0–34.0)
MCHC: 29.5 g/dL — ABNORMAL LOW (ref 30.0–36.0)
MCV: 101 fL — ABNORMAL HIGH (ref 80.0–100.0)
Monocytes Absolute: 0.5 10*3/uL (ref 0.1–1.0)
Monocytes Relative: 11 %
Neutro Abs: 1.9 10*3/uL (ref 1.7–7.7)
Neutrophils Relative %: 44 %
Platelets: 146 10*3/uL — ABNORMAL LOW (ref 150–400)
RBC: 3.09 MIL/uL — ABNORMAL LOW (ref 3.87–5.11)
RDW: 16.3 % — ABNORMAL HIGH (ref 11.5–15.5)
WBC: 4.5 10*3/uL (ref 4.0–10.5)
nRBC: 0 % (ref 0.0–0.2)

## 2023-07-03 LAB — AMMONIA: Ammonia: 93 umol/L — ABNORMAL HIGH (ref 9–35)

## 2023-07-03 MED ORDER — HYDROCODONE-ACETAMINOPHEN 5-325 MG PO TABS: 1.0000 | ORAL_TABLET | Freq: Every evening | ORAL | Status: DC | PRN

## 2023-07-03 MED ORDER — IRON SUCROSE 200 MG IVPB - SIMPLE MED
200.0000 mg | Freq: Once | Status: AC
  Administered 2023-07-03: 200 mg via INTRAVENOUS
  Filled 2023-07-03: qty 200

## 2023-07-03 MED ORDER — MORPHINE SULFATE (PF) 2 MG/ML IV SOLN
1.0000 mg | INTRAVENOUS | Status: DC | PRN
  Administered 2023-07-03: 1 mg via INTRAVENOUS
  Filled 2023-07-03: qty 1

## 2023-07-03 NOTE — Progress Notes (Signed)
 PROGRESS NOTE  [name]  DOB: February 21, 1955  PCP: [name], MD FMW:[id number]  DOA: 06/28/2023  LOS: 4 days  Hospital Day: 6  Brief narrative: [name] is a 68 y.o. 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  6/18, patient presented to the ED with complaint of altered mental status. Patient has recurrent issues with hepatic encephalopathy.  2 days prior on 6/16, she was seen in the ED for the same.  Discharged with recommendation to increase lactulose  frequency.  Presented again the next day 6/17, noted to have elevated ammonia level.  She did not have any signs of infection and hence she was discharged to home to continue lactulose . Her son brought her to the ED again on 6/18 stating that patient has 5-6 bowel movements a day because of short gut syndrome.  Despite that she remains confused and hence he was not content with the recommendations.  In the ED, patient was hemodynamically stable Labs showed ammonia level elevated to 127, renal function normal, hemoglobin 9.7 CT chest/abdomen/pelvis was unremarkable for any acute process Patient was admitted to [facility]  Subjective: Patient was seen and examined this morning. Pleasant elderly Caucasian female.  Lying down on bed.  Slow to respond but oriented to place and person.  Her son [name] was at bedside.  He believes that patient has not received adequate attention and appropriate care as she is supposed to. Chart reviewed. In the last 24 hours, afebrile, heart rate in 60s, blood pressure in normal range, breathing on room air Labs from this morning with WBC count 4.5, hemoglobin 9.2, MCV 101, platelet 146, BUN/creatinine 22/1.09, ammonia at 93 which is better than 112 yesterday  Assessment and plan: NASH Cirrhosis with hepatic encephalopathy: Patient seems to have  persistently elevated ammonia level despite claimed compliance to lactulose  and rifaximin .  She has had multiple ED visits and hospitalizations because of the same.   Currently on lactulose  30 mg 3 times daily, rifaximin . Ammonia level gradually improving, 93 today.  Mental status is improving as well. To continue to monitor mental status change for next 24 hours.  If remains stable, plan to discharge home tomorrow For cirrhosis regimen, she is on Lasix  40 mg daily, Aldactone  50 mg daily, midodrine  10 mg 3 times daily. Recent Labs  Lab 06/28/23 1627 06/30/23 0415 07/01/23 1053 07/02/23 0459 07/03/23 0412  AMMONIA 127* 109* 63* 112* 93*   Anemia of chronic disease and iron  deficiency: Hemoglobin stable at 9.7.  Continue monitor. Recent Labs    08/22/22 0235 08/22/22 1858 01/03/23 0617 01/05/23 0531 06/29/23 0430 06/30/23 0415 07/01/23 0506 07/02/23 0459 07/03/23 0412  HGB 7.5*   < >  --    < > 8.4* 9.0* 10.3* 9.0* 9.2*  MCV 100.0   < >  --    < > 97.9 98.3 98.8 97.7 101.0*  VITAMINB12  --   --  483  --   --   --   --   --   --   FOLATE 15.2  --   --   --   --   --   --   --   --   FERRITIN 109  --   --   --   --   --   --   --   --   TIBC 231*  --   --   --   --   --   --   --   --  IRON  59  --   --   --   --   --   --   --   --    < > = values in this interval not displayed.    Short gut syndrome Chronic diarrhea Chronic abdominal pain: CT abdomen negative for acute process.  Continue Bentyl  as needed.   CKD stage IIIa: Renal function stable, monitor. Recent Labs    05/29/23 0313 06/07/23 0220 06/26/23 1533 06/27/23 1510 06/28/23 1542 06/29/23 0430 06/30/23 0415 07/01/23 0506 07/02/23 0459 07/03/23 0412  BUN 21 21 25* 24* 25* 24* 22 22 22 22   CREATININE 1.03* 1.16* 1.26* 1.15* 1.05* 1.13* 0.99 1.13* 1.00 1.09*   Pain and swelling of bilateral lower extremities: Dopplers of bilateral lower extremities were completed and were negative.     Hypothyroidism Continue Synthroid .   GAD Continue venlafaxine    Restless leg syndrome Continue ropinirole .   Chronic pain: Patient is complaining of chronic pain in her back and her knees.  She takes hydrocodone  for this pain at home.  Family is requesting low-dose IV morphine  while in the hospital and hydrocodone  nightly at bedtime.  Ordered.    Mobility: Ambulated with mobility this morning.  Goals of care   Code Status: Do not attempt resuscitation (DNR) PRE-ARREST INTERVENTIONS DESIRED     DVT prophylaxis:  heparin  injection 5,000 Units Start: 06/28/23 2200   Antimicrobials: Rifaximin , lactulose  Fluid: None Consultants: None Family Communication: Son at bedside  Status: Inpatient Level of care:  Med-Surg   Patient is from: Home Needs to continue in-hospital care: Gradually improving mental status Anticipated d/c to: Hopefully home tomorrow      Diet:  Diet Order             Diet 2 gram sodium Room service appropriate? Yes; Fluid consistency: Thin  Diet effective now                   Scheduled Meds:  atorvastatin   40 mg Oral q AM   furosemide   40 mg Oral Daily   heparin   5,000 Units Subcutaneous Q8H   lactulose   30 g Oral TID   levothyroxine   175 mcg Oral Q0600   lidocaine   1 patch Transdermal Daily   midodrine   10 mg Oral TID WC   pantoprazole   40 mg Oral BID AC   potassium chloride  SA  20 mEq Oral BID   rifaximin   550 mg Oral BID   rOPINIRole   0.25 mg Oral QHS   sodium bicarbonate   650 mg Oral BID   spironolactone   50 mg Oral Daily   venlafaxine  XR  75 mg Oral Q breakfast    PRN meds: acetaminophen  **OR** acetaminophen , albuterol , dicyclomine , diphenhydrAMINE , HYDROcodone -acetaminophen , melatonin, morphine  injection, prochlorperazine    Infusions:   iron  sucrose      Antimicrobials: Anti-infectives (From admission, onward)    Start     Dose/Rate Route Frequency Ordered Stop   06/28/23 2200  rifaximin  (XIFAXAN ) tablet 550 mg         550 mg Oral 2 times daily 06/28/23 2122         Objective: Vitals:   07/03/23 0909 07/03/23 1247  BP: (!) 148/81 (!) 155/69  Pulse:  67  Resp:  20  Temp:  98.3 F (36.8 C)  SpO2:  100%   No intake or output data in the 24 hours ending 07/03/23 1549 Filed Weights   06/30/23 0706 07/01/23 0705 07/02/23 0426  Weight: 95 kg 95 kg 96 kg  Weight change:  Body mass index is 38.71 kg/m.   Physical Exam: General exam: Pleasant, elderly Caucasian female. Skin: No rashes, lesions or ulcers. HEENT: Atraumatic, normocephalic, no obvious bleeding Lungs: Clear to auscultation bilaterally,  CVS: S1, S2, no murmur,   GI/Abd: Soft, mild chronic tenderness present, ventral hernia noted, bowel sound present,   CNS: Alert, awake, slow to respond, oriented to person place Psychiatry: Mood appropriate,  Extremities: Trace improving bilateral pedal edema, no calf tenderness,   Data Review: I have personally reviewed the laboratory data and studies available.  F/u labs ordered Unresulted Labs (From admission, onward)     Start     Ordered   07/04/23 0500  CBC with Differential/Platelet  Tomorrow morning,   R       Question:  Specimen collection method  Answer:  Lab=Lab collect   07/03/23 1549   07/04/23 0500  Comprehensive metabolic panel with GFR  Tomorrow morning,   R       Question:  Specimen collection method  Answer:  Lab=Lab collect   07/03/23 1549   07/04/23 0500  Ammonia  Tomorrow morning,   R       Question:  Specimen collection method  Answer:  Lab=Lab collect   07/03/23 1549           Signed, Chapman Rota, MD [facility] 07/03/2023

## 2023-07-03 NOTE — Progress Notes (Signed)
 Mobility Specialist - Progress Note   07/03/23 0909  Therapy Vitals  BP (!) 148/81  Mobility  Activity Ambulated with assistance in hallway  Level of Assistance Contact guard assist, steadying assist  Assistive Device Front wheel walker  Distance Ambulated (ft) 200 ft  Range of Motion/Exercises Active  Activity Response Tolerated well  Mobility Referral Yes  Mobility visit 1 Mobility  Mobility Specialist Start Time (ACUTE ONLY) 0850  Mobility Specialist Stop Time (ACUTE ONLY) 0907  Mobility Specialist Time Calculation (min) (ACUTE ONLY) 17 min   Received EOB and agreed to mobility. Contact guard for STS, transfer to [facility]. Continued to hallway once finished. Contact guard entire session. At EOS pt endorsed pain in LLE, calf.   Left in chair with all needs met, son and staff in room.  Cyndee Ada Mobility Specialist

## 2023-07-03 NOTE — Plan of Care (Signed)

## 2023-07-04 DIAGNOSIS — K7682 Hepatic encephalopathy: Secondary | ICD-10-CM | POA: Diagnosis not present

## 2023-07-04 LAB — CBC WITH DIFFERENTIAL/PLATELET
Abs Immature Granulocytes: 0.01 10*3/uL (ref 0.00–0.07)
Basophils Absolute: 0.1 10*3/uL (ref 0.0–0.1)
Basophils Relative: 1 %
Eosinophils Absolute: 0.4 10*3/uL (ref 0.0–0.5)
Eosinophils Relative: 8 %
HCT: 30.9 % — ABNORMAL LOW (ref 36.0–46.0)
Hemoglobin: 9.2 g/dL — ABNORMAL LOW (ref 12.0–15.0)
Immature Granulocytes: 0 %
Lymphocytes Relative: 34 %
Lymphs Abs: 1.5 10*3/uL (ref 0.7–4.0)
MCH: 30 pg (ref 26.0–34.0)
MCHC: 29.8 g/dL — ABNORMAL LOW (ref 30.0–36.0)
MCV: 100.7 fL — ABNORMAL HIGH (ref 80.0–100.0)
Monocytes Absolute: 0.5 10*3/uL (ref 0.1–1.0)
Monocytes Relative: 10 %
Neutro Abs: 2.2 10*3/uL (ref 1.7–7.7)
Neutrophils Relative %: 47 %
Platelets: 127 10*3/uL — ABNORMAL LOW (ref 150–400)
RBC: 3.07 MIL/uL — ABNORMAL LOW (ref 3.87–5.11)
RDW: 16.3 % — ABNORMAL HIGH (ref 11.5–15.5)
WBC: 4.6 10*3/uL (ref 4.0–10.5)
nRBC: 0 % (ref 0.0–0.2)

## 2023-07-04 LAB — COMPREHENSIVE METABOLIC PANEL WITH GFR
ALT: 19 U/L (ref 0–44)
AST: 31 U/L (ref 15–41)
Albumin: 2.3 g/dL — ABNORMAL LOW (ref 3.5–5.0)
Alkaline Phosphatase: 90 U/L (ref 38–126)
Anion gap: 9 (ref 5–15)
BUN: 21 mg/dL (ref 8–23)
CO2: 19 mmol/L — ABNORMAL LOW (ref 22–32)
Calcium: 8.4 mg/dL — ABNORMAL LOW (ref 8.9–10.3)
Chloride: 110 mmol/L (ref 98–111)
Creatinine, Ser: 1.07 mg/dL — ABNORMAL HIGH (ref 0.44–1.00)
GFR, Estimated: 58 mL/min — ABNORMAL LOW (ref >60)
Glucose, Bld: 103 mg/dL — ABNORMAL HIGH (ref 70–99)
Potassium: 4.8 mmol/L (ref 3.5–5.1)
Sodium: 138 mmol/L (ref 135–145)
Total Bilirubin: 1.5 mg/dL — ABNORMAL HIGH (ref 0.0–1.2)
Total Protein: 5.2 g/dL — ABNORMAL LOW (ref 6.5–8.1)

## 2023-07-04 LAB — AMMONIA: Ammonia: 111 umol/L — ABNORMAL HIGH (ref 9–35)

## 2023-07-04 MED ORDER — OXYCODONE HCL 5 MG PO TABS
5.0000 mg | ORAL_TABLET | Freq: Two times a day (BID) | ORAL | Status: DC | PRN
  Administered 2023-07-04: 5 mg via ORAL
  Filled 2023-07-04: qty 1

## 2023-07-04 NOTE — Progress Notes (Signed)
 PROGRESS NOTE  [name]  DOB: 08-Oct-1955  PCP: [name], MD FMW:[id number]  DOA: 06/28/2023  LOS: 5 days  Hospital Day: 7  Brief narrative: [name] is a 68 y.o. 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  6/18, patient presented to the ED with complaint of altered mental status. Patient has recurrent issues with hepatic encephalopathy.  2 days prior on 6/16, she was seen in the ED for the same.  Discharged with recommendation to increase lactulose  frequency.  Presented again the next day 6/17, noted to have elevated ammonia level.  She did not have any signs of infection and hence she was discharged to home to continue lactulose . Her son brought her to the ED again on 6/18 stating that patient has 5-6 bowel movements a day because of short gut syndrome.  Despite that she remains confused and hence he was not content with the recommendations.  In the ED, patient was hemodynamically stable Labs showed ammonia level elevated to 127, renal function normal, hemoglobin 9.7 CT chest/abdomen/pelvis was unremarkable for any acute process Patient was admitted to [facility]  Subjective: Patient was seen and examined this morning. Sitting up in recliner.  Slept well last night with IV morphine . Still complains of bilateral leg pain. Ammonia level up today at 111.  Assessment and plan: NASH Cirrhosis with hepatic encephalopathy Patient seems to have persistently elevated ammonia level despite claimed compliance to lactulose  and rifaximin . She has had multiple ED visits and hospitalizations because of the same.   Currently on lactulose  30 mg 3 times daily, rifaximin . Ammonia level remains mostly elevated close to 100.  Mental status improved back to normal. For cirrhosis regimen, she is on Lasix  40 mg daily, Aldactone  50 mg  daily, midodrine  10 mg 3 times daily. Recent Labs  Lab 06/30/23 0415 07/01/23 1053 07/02/23 0459 07/03/23 0412 07/04/23 0415  AMMONIA 109* 63* 112* 93* 111*   Anemia of chronic disease and iron  deficiency Hemoglobin stable at 9.7.  Continue monitor. Recent Labs    08/22/22 0235 08/22/22 1858 01/03/23 0617 01/05/23 0531 06/30/23 0415 07/01/23 0506 07/02/23 0459 07/03/23 0412 07/04/23 0415  HGB 7.5*   < >  --    < > 9.0* 10.3* 9.0* 9.2* 9.2*  MCV 100.0   < >  --    < > 98.3 98.8 97.7 101.0* 100.7*  VITAMINB12  --   --  483  --   --   --   --   --   --   FOLATE 15.2  --   --   --   --   --   --   --   --   FERRITIN 109  --   --   --   --   --   --   --   --   TIBC 231*  --   --   --   --   --   --   --   --   IRON  59  --   --   --   --   --   --   --   --    < > = values in this interval not displayed.    Short gut syndrome Chronic diarrhea Chronic abdominal pain: CT abdomen negative for acute process.  Continue Bentyl  as needed.   CKD stage IIIa Renal function stable, monitor. Recent  Labs    06/07/23 0220 06/26/23 1533 06/27/23 1510 06/28/23 1542 06/29/23 0430 06/30/23 0415 07/01/23 0506 07/02/23 0459 07/03/23 0412 07/04/23 0415  BUN 21 25* 24* 25* 24* 22 22 22 22 21   CREATININE 1.16* 1.26* 1.15* 1.05* 1.13* 0.99 1.13* 1.00 1.09* 1.07*   Pain and swelling of bilateral lower extremities Dopplers of bilateral lower extremities were completed and were negative.    Hypothyroidism Continue Synthroid .   GAD Continue venlafaxine    Restless leg syndrome Continue ropinirole .   Chronic lower extremity pain: Patient complains of chronic pain in her back and her knees.  She takes hydrocodone  for this pain at home.  Also low-dose IV morphine  while in the hospital and hydrocodone  nightly at bedtime.  Patient continues to complain of bilateral leg pain.  On exam, she does not have any area of cellulitis, open wound..  She has mild swelling of both lower  extremities mostly in the upper calf but bilaterally symmetrical.  No area of point tenderness.  It seems she had ultrasound duplex of lower extremity done 4 times in last 2 years and all are negative for DVT.   Mobility: Ambulated with mobility this morning.  Goals of care   Code Status: Do not attempt resuscitation (DNR) PRE-ARREST INTERVENTIONS DESIRED     DVT prophylaxis:  heparin  injection 5,000 Units Start: 06/28/23 2200   Antimicrobials: Rifaximin , lactulose  Fluid: None Consultants: None Family Communication: Son was not at bedside.  I spoke to him on the phone.  He is concerned about elevated ammonia level and last bowel movement .  He wants to wait till tomorrow for discharge.   Status: Inpatient Level of care:  Med-Surg   Patient is from: Home Needs to continue in-hospital care: Gradually improving mental status Anticipated d/c to: Hopefully home tomorrow      Diet:  Diet Order             Diet 2 gram sodium Room service appropriate? Yes; Fluid consistency: Thin  Diet effective now                   Scheduled Meds:  atorvastatin   40 mg Oral q AM   furosemide   40 mg Oral Daily   heparin   5,000 Units Subcutaneous Q8H   lactulose   30 g Oral TID   levothyroxine   175 mcg Oral Q0600   lidocaine   1 patch Transdermal Daily   midodrine   10 mg Oral TID WC   pantoprazole   40 mg Oral BID AC   potassium chloride  SA  20 mEq Oral BID   rifaximin   550 mg Oral BID   rOPINIRole   0.25 mg Oral QHS   sodium bicarbonate   650 mg Oral BID   spironolactone   50 mg Oral Daily   venlafaxine  XR  75 mg Oral Q breakfast    PRN meds: acetaminophen  **OR** acetaminophen , albuterol , dicyclomine , diphenhydrAMINE , HYDROcodone -acetaminophen , melatonin, morphine  injection, oxyCODONE , prochlorperazine    Infusions:     Antimicrobials: Anti-infectives (From admission, onward)    Start     Dose/Rate Route Frequency Ordered Stop   06/28/23 2200  rifaximin  (XIFAXAN ) tablet 550 mg         550 mg Oral 2 times daily 06/28/23 2122         Objective: Vitals:   07/04/23 0900 07/04/23 1313  BP: 125/67 124/63  Pulse:  79  Resp:  19  Temp:  98.7 F (37.1 C)  SpO2:  97%    Intake/Output Summary (Last 24 hours) at 07/04/2023  1518 Last data filed at 07/03/2023 1816 Gross per 24 hour  Intake 0 ml  Output --  Net 0 ml   Filed Weights   06/30/23 0706 07/01/23 0705 07/02/23 0426  Weight: 95 kg 95 kg 96 kg   Weight change:  Body mass index is 38.71 kg/m.   Physical Exam: General exam: Pleasant, elderly Caucasian female. Skin: No rashes, lesions or ulcers. HEENT: Atraumatic, normocephalic, no obvious bleeding Lungs: Clear to auscultation bilaterally,  CVS: S1, S2, no murmur,   GI/Abd: Soft, mild chronic tenderness present, ventral hernia noted, bowel sound present,   CNS: Alert, awake, oriented times 3 Psychiatry: Mood appropriate,  Extremities: Trace improving bilateral pedal edema, no calf tenderness,   Data Review: I have personally reviewed the laboratory data and studies available.  F/u labs ordered Unresulted Labs (From admission, onward)     Start     Ordered   07/05/23 0500  CBC with Differential/Platelet  Tomorrow morning,   R       Question:  Specimen collection method  Answer:  Lab=Lab collect   07/04/23 1515   07/05/23 0500  Comprehensive metabolic panel with GFR  Tomorrow morning,   R       Question:  Specimen collection method  Answer:  Lab=Lab collect   07/04/23 1515   07/05/23 0500  Ammonia  Tomorrow morning,   R       Question:  Specimen collection method  Answer:  Lab=Lab collect   07/04/23 1515           Signed, Chapman Rota, MD [facility] 07/04/2023

## 2023-07-04 NOTE — Progress Notes (Signed)
 Mobility Specialist - Progress Note   07/04/23 0900  Therapy Vitals  BP 125/67  Mobility  Activity Ambulated with assistance in hallway  Level of Assistance Standby assist, set-up cues, supervision of patient - no hands on  Assistive Device Front wheel walker  Distance Ambulated (ft) 200 ft  Range of Motion/Exercises Active  Activity Response Tolerated well  Mobility Referral Yes  Mobility visit 1 Mobility  Mobility Specialist Start Time (ACUTE ONLY) [id number]  Mobility Specialist Stop Time (ACUTE ONLY) 0932  Mobility Specialist Time Calculation (min) (ACUTE ONLY) 16 min   Received in bed and agreed to mobility, no assist to get EOB, no assist to stand. Standby entire session, c/o pain in both LE's. Returned to chair where pt was requesting a blood pressure reading.  BP 167/76  Returned to chair with all needs met.  Cyndee Ada Mobility Specialist

## 2023-07-04 NOTE — Plan of Care (Signed)

## 2023-07-05 DIAGNOSIS — K7682 Hepatic encephalopathy: Secondary | ICD-10-CM | POA: Diagnosis not present

## 2023-07-05 LAB — CBC WITH DIFFERENTIAL/PLATELET
Abs Immature Granulocytes: 0 10*3/uL (ref 0.00–0.07)
Basophils Absolute: 0 10*3/uL (ref 0.0–0.1)
Basophils Relative: 1 %
Eosinophils Absolute: 0.3 10*3/uL (ref 0.0–0.5)
Eosinophils Relative: 6 %
HCT: 29.4 % — ABNORMAL LOW (ref 36.0–46.0)
Hemoglobin: 9 g/dL — ABNORMAL LOW (ref 12.0–15.0)
Immature Granulocytes: 0 %
Lymphocytes Relative: 35 %
Lymphs Abs: 1.5 10*3/uL (ref 0.7–4.0)
MCH: 30.1 pg (ref 26.0–34.0)
MCHC: 30.6 g/dL (ref 30.0–36.0)
MCV: 98.3 fL (ref 80.0–100.0)
Monocytes Absolute: 0.5 10*3/uL (ref 0.1–1.0)
Monocytes Relative: 11 %
Neutro Abs: 2.1 10*3/uL (ref 1.7–7.7)
Neutrophils Relative %: 47 %
Platelets: 118 10*3/uL — ABNORMAL LOW (ref 150–400)
RBC: 2.99 MIL/uL — ABNORMAL LOW (ref 3.87–5.11)
RDW: 16.5 % — ABNORMAL HIGH (ref 11.5–15.5)
WBC: 4.4 10*3/uL (ref 4.0–10.5)
nRBC: 0 % (ref 0.0–0.2)

## 2023-07-05 LAB — COMPREHENSIVE METABOLIC PANEL WITH GFR
ALT: 19 U/L (ref 0–44)
AST: 29 U/L (ref 15–41)
Albumin: 2.2 g/dL — ABNORMAL LOW (ref 3.5–5.0)
Alkaline Phosphatase: 90 U/L (ref 38–126)
Anion gap: 6 (ref 5–15)
BUN: 21 mg/dL (ref 8–23)
CO2: 22 mmol/L (ref 22–32)
Calcium: 8.4 mg/dL — ABNORMAL LOW (ref 8.9–10.3)
Chloride: 108 mmol/L (ref 98–111)
Creatinine, Ser: 1 mg/dL (ref 0.44–1.00)
GFR, Estimated: >60 mL/min (ref >60)
Glucose, Bld: 104 mg/dL — ABNORMAL HIGH (ref 70–99)
Potassium: 4.3 mmol/L (ref 3.5–5.1)
Sodium: 136 mmol/L (ref 135–145)
Total Bilirubin: 1.7 mg/dL — ABNORMAL HIGH (ref 0.0–1.2)
Total Protein: 5.3 g/dL — ABNORMAL LOW (ref 6.5–8.1)

## 2023-07-05 LAB — AMMONIA: Ammonia: 154 umol/L — ABNORMAL HIGH (ref 9–35)

## 2023-07-05 NOTE — Discharge Summary (Signed)
 Physician Discharge Summary  [name] FMW:[id number] DOB: 1955/12/26 DOA: 06/28/2023  PCP: [name], MD  Admit date: 06/28/2023 Discharge date: 07/05/2023  Admitted From: Home Discharge disposition: Home  Recommendations at discharge:  Continue lactulose  and rifaximin  to tolerate at least 3 bowel movements a day Continue to follow-up with hepatologist as an outpatient.   Brief narrative: [name] is a 68 y.o. 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  6/18, patient presented to the ED with complaint of altered mental status. Patient has recurrent issues with hepatic encephalopathy.  2 days prior on 6/16, she was seen in the ED for the same.  Discharged with recommendation to increase lactulose  frequency.  Presented again the next day 6/17, noted to have elevated ammonia level.  She did not have any signs of infection and hence she was discharged to home to continue lactulose . Her son brought her to the ED again on 6/18 stating that patient has 5-6 bowel movements a day because of short gut syndrome.  Despite that she remains confused and hence he was not content with the recommendations.  In the ED, patient was hemodynamically stable Labs showed ammonia level elevated to 127, renal function normal, hemoglobin 9.7 CT chest/abdomen/pelvis was unremarkable for any acute process Patient was admitted to [facility]  Subjective: Patient was seen and examined this morning. Lying on bed.  Not in distress.  Able to have a meaningful conversation.  Son at bedside. We discussed about patient's fluctuating ammonia level which is elevated to 154 today but despite that her mental status is much better. Patient and her son are comfortable with the plan of discharge today.  Assessment and plan: NASH Cirrhosis with hepatic  encephalopathy Patient seems to have persistently elevated ammonia level despite claimed compliance to lactulose  and rifaximin . She has had multiple ED visits and hospitalizations because of the same.   Currently on lactulose  30 mg 3 times daily, rifaximin . Patient ammonia level is steadily rising up in the last 4 days despite which altered mental status is better.  On my long conversation with patient and her son, I noted that she has chronic diarrhea related to short gut syndrome despite which she is unable to clear ammonia.  It seems her brain has learned to adapt to high ammonia level and hence she is completely oriented x 3 despite significantly elevated ammonia level.  I think it is going to be challenging to target several bowel movements a day just to keep ammonia level down.  I would recommend to continue lactulose  30 mg 3 times a day and rifaximin  for now. Definitely needs to follow-up with hepatologist as an outpatient. For portal hypertension regimen, she is on Lasix  20 mg daily, Aldactone  50 mg daily, midodrine  10 mg 3 times daily.  Also on potassium supplementation while on Lasix . I would continue the same. Recent Labs  Lab 07/01/23 1053 07/02/23 0459 07/03/23 0412 07/04/23 0415 07/05/23 0433  AMMONIA 63* 112* 93* 111* 154*   Anemia of chronic disease and iron  deficiency Hemoglobin stable at 9.7.  Continue monitor. Recent Labs    08/22/22 0235 08/22/22 1858 01/03/23 0617 01/05/23 0531 07/01/23 0506 07/02/23 0459 07/03/23 0412 07/04/23 0415 07/05/23 0433  HGB 7.5*   < >  --    < > 10.3* 9.0* 9.2* 9.2* 9.0*  MCV 100.0   < >  --    < >  98.8 97.7 101.0* 100.7* 98.3  VITAMINB12  --   --  483  --   --   --   --   --   --   FOLATE 15.2  --   --   --   --   --   --   --   --   FERRITIN 109  --   --   --   --   --   --   --   --   TIBC 231*  --   --   --   --   --   --   --   --   IRON  59  --   --   --   --   --   --   --   --    < > = values in this interval not displayed.    Short gut syndrome Chronic diarrhea Chronic abdominal pain: CT abdomen negative for acute process.  Continue Bentyl  as needed.   CKD stage IIIa Renal function stable, monitor. Recent Labs    06/26/23 1533 06/27/23 1510 06/28/23 1542 06/29/23 0430 06/30/23 0415 07/01/23 0506 07/02/23 0459 07/03/23 0412 07/04/23 0415 07/05/23 0433  BUN 25* 24* 25* 24* 22 22 22 22 21 21   CREATININE 1.26* 1.15* 1.05* 1.13* 0.99 1.13* 1.00 1.09* 1.07* 1.00   Pain and swelling of bilateral lower extremities Dopplers of bilateral lower extremities were completed and were negative.    Hypothyroidism Continue Synthroid .   GAD Continue venlafaxine    Restless leg syndrome Continue ropinirole .   Chronic lower extremity pain: Patient complains of chronic pain in her back and her knees.  She takes hydrocodone  for this pain at home.  Also low-dose IV morphine  while in the hospital and hydrocodone  nightly at bedtime.  Patient continues to complain of bilateral leg pain.  On exam, she does not have any area of cellulitis, open wound..  She has mild swelling of both lower extremities mostly in the upper calf but bilaterally symmetrical.  No area of point tenderness.  It seems she had ultrasound duplex of lower extremity done 4 times in last 2 years and all are negative for DVT. Patient follows up with a pain management specialist.   Mobility: Ambulated with mobility this morning.  Goals of care   Code Status: Do not attempt resuscitation (DNR) PRE-ARREST INTERVENTIONS DESIRED   Diet:  Diet Order             Diet general           Diet 2 gram sodium Room service appropriate? Yes; Fluid consistency: Thin  Diet effective now                   Nutritional status:  Body mass index is 38.15 kg/m.       Wounds:  - Wound / Incision (Open or Dehisced) 05/02/22 (ITD) Intertriginous Dermatitis Groin Bilateral (Active)  Date First Assessed/Time First Assessed: 05/02/22 0200   Wound Type:  (ITD) Intertriginous Dermatitis  Location: Groin  Location Orientation: Bilateral  Present on Admission: Yes    Assessments 05/02/2022  3:00 AM 05/21/2022  9:00 AM  Dressing Type Moisture barrier None  Dressing Status -- Clean, Dry, Intact  Site / Wound Assessment Clean;Pink;Red;Painful Clean;Dry;Red  Peri-wound Assessment Erythema (blanchable);Excoriated;Bleeding --  Drainage Amount Scant --  Drainage Description Serosanguineous --  Treatment Cleansed --     No associated orders.     Wound / Incision (Open or Dehisced) 05/02/22 (  ITD) Intertriginous Dermatitis Knee Posterior;Bilateral (Active)  Date First Assessed/Time First Assessed: 05/02/22 0200   Wound Type: (ITD) Intertriginous Dermatitis  Location: Knee  Location Orientation: Posterior;Bilateral  Present on Admission: Yes    Assessments 05/02/2022  3:00 AM 05/28/2023  9:05 PM  Dressing Type Moisture barrier None  Site / Wound Assessment Red;Painful --  Peri-wound Assessment Intact;Excoriated;Erythema (blanchable) --  Drainage Amount None --  Treatment Cleansed --     No associated orders.     Wound / Incision (Open or Dehisced) 04/06/23 Irritant Dermatitis (Moisture Associated Skin Damage);Other (Comment) Buttocks Mid;Bilateral MASD (Active)  Date First Assessed/Time First Assessed: 04/06/23 1515   Wound Type: Irritant Dermatitis (Moisture Associated Skin Damage);Other (Comment)  Location: Buttocks  Location Orientation: Mid;Bilateral  Wound Description (Comments): MASD  Present on Admissi...    Assessments 10/18/2022 11:00 PM 05/28/2023  9:05 PM  Dressing Type None;Moisture barrier None  Site / Wound Assessment Bleeding;Painful;Red --  Peri-wound Assessment Bleeding;Erythema (non-blanchable) --  Drainage Amount Minimal --  Drainage Description Serosanguineous --  Treatment Cleansed --     No associated orders.     Wound / Incision (Open or Dehisced) 02/07/23 Non-pressure wound Knee Posterior;Bilateral (Active)  Date First  Assessed/Time First Assessed: 02/07/23 2117   Wound Type: Non-pressure wound  Location: Knee  Location Orientation: Posterior;Bilateral  Present on Admission: Yes    Assessments 02/07/2023  9:17 PM 02/09/2023  8:15 AM  Dressing Type None Silver dressings  Dressing Status -- Clean, Dry, Intact  Dressing Change Frequency -- Daily  Site / Wound Assessment Pink;Red --  Drainage Amount None None  Treatment Cleansed --     No associated orders.    Discharge Exam:   Vitals:   07/04/23 2013 07/05/23 0500 07/05/23 0534 07/05/23 0937  BP: 127/60  (!) 128/58 134/73  Pulse: 69  73 73  Resp: 18  18   Temp: 98 F (36.7 C)  98.2 F (36.8 C)   TempSrc: Oral  Oral   SpO2: 100%  97%   Weight:  94.6 kg    Height:        Body mass index is 38.15 kg/m.  General exam: Pleasant, elderly Caucasian female. Skin: No rashes, lesions or ulcers. HEENT: Atraumatic, normocephalic, no obvious bleeding Lungs: Clear to auscultation bilaterally,  CVS: S1, S2, no murmur,   GI/Abd: Soft, mild chronic tenderness present, ventral hernia noted, bowel sound present,   CNS: Alert, awake, oriented X 3 Psychiatry: Mood appropriate,  Extremities: Trace improving bilateral pedal edema, no calf tenderness,  Follow ups:    Follow-up Information     [name], MD Follow up.   Specialty: Family Medicine Contact information: [address] [place] [address] [phone]                 Discharge Instructions:   Discharge Instructions     Call MD for:  difficulty breathing, headache or visual disturbances   Complete by: As directed    Call MD for:  extreme fatigue   Complete by: As directed    Call MD for:  hives   Complete by: As directed    Call MD for:  persistant dizziness or light-headedness   Complete by: As directed    Call MD for:  persistant nausea and vomiting   Complete by: As directed    Call MD for:  severe uncontrolled pain   Complete by: As directed    Call  MD for:  temperature >100.4  Complete by: As directed    Diet general   Complete by: As directed    Discharge instructions   Complete by: As directed    Recommendations at discharge:   Continue lactulose  and rifaximin  to tolerate at least 3 bowel movements a day  Continue to follow-up with hepatologist as an outpatient.  General discharge instructions: Follow with Primary MD [name], MD in 7 days  Please request your PCP  to go over your hospital tests, procedures, radiology results at the follow up. Please get your medicines reviewed and adjusted.  Your PCP may decide to repeat certain labs or tests as needed. Do not drive, operate heavy machinery, perform activities at heights, swimming or participation in water  activities or provide baby sitting services if your were admitted for syncope or siezures until you have seen by Primary MD or a Neurologist and advised to do so again. [place]  Controlled Substance Reporting System database was reviewed. Do not drive, operate heavy machinery, perform activities at heights, swim, participate in water  activities or provide baby-sitting services while on medications for pain, sleep and mood until your outpatient physician has reevaluated you and advised to do so again.  You are strongly recommended to comply with the dose, frequency and duration of prescribed medications. Activity: As tolerated with Full fall precautions use walker/cane & assistance as needed Avoid using any recreational substances like cigarette, tobacco, alcohol, or non-prescribed drug. If you experience worsening of your admission symptoms, develop shortness of breath, life threatening emergency, suicidal or homicidal thoughts you must seek medical attention immediately by calling 911 or calling your MD immediately  if symptoms less severe. You must read complete instructions/literature along with all the possible adverse reactions/side effects for all the medicines you  take and that have been prescribed to you. Take any new medicine only after you have completely understood and accepted all the possible adverse reactions/side effects.  Wear Seat belts while driving. You were cared for by a hospitalist during your hospital stay. If you have any questions about your discharge medications or the care you received while you were in the hospital after you are discharged, you can call the unit and ask to speak with the hospitalist or the covering physician. Once you are discharged, your primary care physician will handle any further medical issues. Please note that NO REFILLS for any discharge medications will be authorized once you are discharged, as it is imperative that you return to your primary care physician (or establish a relationship with a primary care physician if you do not have one).   Increase activity slowly   Complete by: As directed        Discharge Medications:   Allergies as of 07/05/2023       Reactions   Ace Inhibitors Anaphylaxis, Swelling   Ivp Dye [iodinated Contrast Media] Anaphylaxis   Desitin [zinc  Oxide] Rash, Other (See Comments)   Worsens rash   Gadolinium Derivatives Other (See Comments)   Reaction??   Hydroxyzine  Other (See Comments)   Affects the mind   Penicillin G Dermatitis   Yellow Jacket Venom Swelling   Chlorhexidine  Rash, Other (See Comments)   WORSENS RASHES   Doxycycline Rash   Penicillins Rash   Zofran  [ondansetron ] Rash        Medication List     STOP taking these medications    tamsulosin  0.4 MG Caps capsule Commonly known as: FLOMAX        TAKE these medications    albuterol  108 (  90 Base) MCG/ACT inhaler Commonly known as: VENTOLIN  HFA Inhale 2 puffs into the lungs every 6 (six) hours as needed for shortness of breath or wheezing.   atorvastatin  40 MG tablet Commonly known as: LIPITOR Take 1 tablet (40 mg total) by mouth in the morning.   CENTRUM SILVER ULTRA WOMENS PO Take 1 tablet by  mouth in the morning.   copper  tablet Take 2 mg by mouth in the morning and at bedtime.   cyanocobalamin  1000 MCG tablet Commonly known as: VITAMIN B12 Take 2,000 mcg by mouth every Monday, Wednesday, and Friday.   dicyclomine  10 MG capsule Commonly known as: BENTYL  Take 1 capsule (10 mg total) by mouth 3 (three) times daily as needed for spasms (abdominal cramping).   famotidine  20 MG tablet Commonly known as: PEPCID  Take 20 mg by mouth in the morning.   folic acid  1 MG tablet Commonly known as: FOLVITE  Take 1 tablet (1 mg total) by mouth daily.   furosemide  40 MG tablet Commonly known as: LASIX  Take 0.5 tablets (20 mg total) by mouth daily. What changed:  how much to take when to take this   HYDROcodone -acetaminophen  10-325 MG tablet Commonly known as: NORCO Take 1 tablet by mouth every 8 (eight) hours as needed for moderate pain (pain score 4-6).   IRON -VITAMIN C  PO Take 1 tablet by mouth daily with breakfast.   lactulose  10 GM/15ML solution Commonly known as: CHRONULAC  Take 45 mLs (30 g total) by mouth 3 (three) times daily. Take to ensure that there is 2 loose bowel movement EVERY DAY   levothyroxine  175 MCG tablet Commonly known as: SYNTHROID  Take 1 tablet (175 mcg total) by mouth daily at 6 (six) AM. Recheck TSH with PCP in 4 weeks   lidocaine  5 % Commonly known as: LIDODERM  Place 1 patch onto the skin daily. Remove & Discard patch within 12 hours or as directed by MD What changed:  when to take this reasons to take this additional instructions   magnesium  gluconate 500 (27 Mg) MG Tabs tablet Commonly known as: MAGONATE Take 1 tablet (500 mg total) by mouth in the morning and at bedtime. What changed:  how much to take when to take this   meclizine  25 MG tablet Commonly known as: ANTIVERT  Take 1 tablet (25 mg total) by mouth 3 (three) times daily as needed for dizziness.   megestrol  40 MG tablet Commonly known as: MEGACE  Take 1 tablet (40 mg  total) by mouth 2 (two) times daily.   methocarbamol  500 MG tablet Commonly known as: ROBAXIN  Take 500 mg by mouth.   midodrine  10 MG tablet Commonly known as: PROAMATINE  TAKE 1 TABLET(10 MG) BY MOUTH THREE TIMES DAILY WITH MEALS   naloxone 4 MG/0.1ML Liqd nasal spray kit Commonly known as: NARCAN Place 1 spray into the nose as needed (accidental overdose).   nystatin  cream Commonly known as: MYCOSTATIN  Apply 1 Application topically 2 (two) times daily as needed for dry skin.   pantoprazole  40 MG tablet Commonly known as: PROTONIX  Take 1 tablet (40 mg total) by mouth 2 (two) times daily before a meal.   polyethylene glycol 17 g packet Commonly known as: MIRALAX  / GLYCOLAX  Take 17 g by mouth daily as needed for moderate constipation.   potassium chloride  10 MEQ tablet Commonly known as: KLOR-CON  Take 20 mEq by mouth 2 (two) times daily.   rifaximin  550 MG Tabs tablet Commonly known as: XIFAXAN  Take 550 mg by mouth 2 (two) times daily.  rOPINIRole  0.25 MG tablet Commonly known as: REQUIP  TAKE 1 TABLET(0.25 MG) BY MOUTH AT BEDTIME   senna-docusate 8.6-50 MG tablet Commonly known as: Senokot-S Take 1 tablet by mouth 2 (two) times daily.   spironolactone  50 MG tablet Commonly known as: Aldactone  Take 1 tablet (50 mg total) by mouth daily.   TobraDex ophthalmic ointment Generic drug: tobramycin-dexamethasone  Place 1 Application into the left eye daily as needed.   venlafaxine  XR 75 MG 24 hr capsule Commonly known as: EFFEXOR -XR Take 1 capsule (75 mg total) by mouth daily with breakfast.   Vitamin D (Ergocalciferol) 1.25 MG (50000 UNIT) Caps capsule Commonly known as: DRISDOL Take 50,000 Units by mouth every Wednesday.         The results of significant diagnostics from this hospitalization (including imaging, microbiology, ancillary and laboratory) are listed below for reference.    Procedures and Diagnostic Studies:   CT CHEST ABDOMEN PELVIS WO  CONTRAST Result Date: 06/28/2023 EXAM:  CT CHEST ABDOMEN PELVIS WITHOUT IV CONTRAST INDICATION: shortness of breath with rhonchi, generalized abdominal pain, bilateral back pain. Hx of hepatic encephalopathy. All Sx increasing in intensity over 3 days. TECHNIQUE: Spiral CT scanning was performed through the chest, abdomen and pelvis with no contrast. COMPARISON: 05/30/2023 CT abdomen and pelvis, 08/29/2022 CT chest FINDINGS: The cardiac size is within normal limits. Extensive coronary calcification is present. There is no thoracic aortic aneurysm. No filling defects are identified in the central pulmonary arteries. The esophagus and thyroid  glands have a normal appearance. There is no mass or adenopathy in the chest. No pleural or pericardial effusion is present. There is some mild scarring or discoid atelectasis are present in the inferior lingula and left lower lobe. The right lung is clear. Mild hepatic lobulation is present, suggesting cirrhosis. There is stable splenomegaly. The pancreas has a normal appearance. The gallbladder is surgically absent. There is a small nonobstructive right renal calculus. There are no adrenal masses. Postsurgical changes are present in the right colon. There is no bowel dilatation or bowel wall thickening. There is no evidence of ascites or adenopathy. No abdominal aortic aneurysm is present. There is stable rectus laxity. An enlarged uterus is present with calcified fibroids. There is mesh in the anterior pelvic wall from previous ventral hernia repair. A small fluid-filled left paramedian ventral hernia is present at the level of the uterus which is unchanged. There is no inguinal hernia. The pelvic bowel loops have a normal appearance. There is no acute fracture or bone destruction. A stable T12 compression deformity is present with bone cement. IMPRESSION: 1. No evidence of acute process. 2. Redemonstration of probable cirrhosis with splenomegaly. 3. Small right renal calculus.  4. Additional nonacute findings as described above. Please note that CT scanning at this site utilizes multiple dose reduction techniques, including automatic exposure control, adjustment of the MAA and/or KVP according to the patient's size, and use of iterative reconstruction. Electronically signed by: Eddy Oar MD 06/28/2023 05:33 PM EDT RP Workstation: [id number]     Labs:   Basic Metabolic Panel: Recent Labs  Lab 06/28/23 1559 06/29/23 0430 07/01/23 9493 07/02/23 0459 07/03/23 0412 07/04/23 0415 07/05/23 0433  NA  --    < > 140 139 139 138 136  K  --    < > 4.6 4.5 4.6 4.8 4.3  CL  --    < > 110 111 111 110 108  CO2  --    < > 22 21* 23 19* 22  GLUCOSE  --    < >  88 109* 99 103* 104*  BUN  --    < > 22 22 22 21 21   CREATININE  --    < > 1.13* 1.00 1.09* 1.07* 1.00  CALCIUM   --    < > 8.6* 8.2* 8.4* 8.4* 8.4*  MG 2.0  --   --   --   --   --   --    < > = values in this interval not displayed.   GFR Estimated Creatinine Clearance: 60.1 mL/min (by C-G formula based on SCr of 1 mg/dL). Liver Function Tests: Recent Labs  Lab 06/30/23 0415 07/02/23 0459 07/03/23 0412 07/04/23 0415 07/05/23 0433  AST 29 30 31 31 29   ALT 21 21 21 19 19   ALKPHOS 92 94 96 90 90  BILITOT 1.6* 1.7* 1.9* 1.5* 1.7*  PROT 5.4* 5.4* 5.6* 5.2* 5.3*  ALBUMIN  2.2* 2.2* 2.2* 2.3* 2.2*   Recent Labs  Lab 06/28/23 1559  LIPASE 48   Recent Labs  Lab 07/01/23 1053 07/02/23 0459 07/03/23 0412 07/04/23 0415 07/05/23 0433  AMMONIA 63* 112* 93* 111* 154*   Coagulation profile Recent Labs  Lab 07/02/23 0459  INR 1.8*    CBC: Recent Labs  Lab 07/01/23 0506 07/02/23 0459 07/03/23 0412 07/04/23 0415 07/05/23 0433  WBC 5.5 4.4 4.5 4.6 4.4  NEUTROABS 2.6 2.0 1.9 2.2 2.1  HGB 10.3* 9.0* 9.2* 9.2* 9.0*  HCT 34.1* 30.4* 31.2* 30.9* 29.4*  MCV 98.8 97.7 101.0* 100.7* 98.3  PLT 164 153 146* 127* 118*   Cardiac Enzymes: No results for input(s): CKTOTAL, CKMB, CKMBINDEX,  TROPONINI in the last 168 hours. BNP: Invalid input(s): POCBNP CBG: Recent Labs  Lab 06/28/23 1524  GLUCAP 94   D-Dimer No results for input(s): DDIMER in the last 72 hours. Hgb A1c No results for input(s): HGBA1C in the last 72 hours. Lipid Profile No results for input(s): CHOL, HDL, LDLCALC, TRIG, CHOLHDL, LDLDIRECT in the last 72 hours. Thyroid  function studies No results for input(s): TSH, T4TOTAL, T3FREE, THYROIDAB in the last 72 hours.  Invalid input(s): FREET3 Anemia work up No results for input(s): VITAMINB12, FOLATE, FERRITIN, TIBC, IRON , RETICCTPCT in the last 72 hours. Microbiology Recent Results (from the past 240 hours)  Blood culture (routine x 2)     Status: None   Collection Time: 06/27/23  3:06 PM   Specimen: Right Antecubital; Blood  Result Value Ref Range Status   Specimen Description   Final    RIGHT ANTECUBITAL Performed at [facility], [address], [place], [place] [address]    Special Requests   Final    BOTTLES DRAWN AEROBIC AND ANAEROBIC Blood Culture adequate volume Performed at [facility], [address], [place], [place] [address]    Culture   Final    NO GROWTH 5 DAYS Performed at [facility], [address], [place], [place] [address]    Report Status 07/02/2023 FINAL  Final    Time coordinating discharge: 45 minutes  Signed: Pa Tennant  [facility] 07/05/2023, 11:09 AM

## 2023-07-05 NOTE — TOC Transition Note (Signed)
 Transition of Care [facility]) - Discharge Note   Patient Details  Name: [name] MRN: [id number] Date of Birth: [birth year]  Transition of Care [facility]) CM/SW Contact:  Doneta Glenys DASEN, RN Phone Number: 07/05/2023, 11:54 AM   Clinical Narrative:    Per MD patient is medically ready for discharge. Patient and son [name] states that patient is at baseline with ambulation and no need for PT to evaluate prior to discharge.  TOC signing off.  Final next level of care: Home/Self Care Barriers to Discharge: Barriers Resolved   Patient Goals and CMS Choice Patient states their goals for this hospitalization and ongoing recovery are:: Home with son CMS Medicare.gov Compare Post Acute Care list provided to::  (NA) Choice offered to / list presented to : NA [place] ownership interest in [facility].provided to:: Parent NA    Discharge Placement                  Name of family member notified: [name] Patient and family notified of of transfer: 07/05/23  Discharge Plan and Services Additional resources added to the After Visit Summary for   In-house Referral: NA Discharge Planning Services: CM Consult Post Acute Care Choice: NA          DME Arranged: N/A DME Agency: NA       HH Arranged: NA HH Agency: NA        Social Drivers of Health (SDOH) Interventions SDOH Screenings   Food Insecurity: No Food Insecurity (06/28/2023)  Housing: Low Risk  (06/28/2023)  Transportation Needs: No Transportation Needs (06/28/2023)  Utilities: Not At Risk (06/28/2023)  Depression (PHQ2-9): Medium Risk (03/06/2023)  Financial Resource Strain: Low Risk  (06/09/2023)  Social Connections: Unknown (06/28/2023)  Recent Concern: Social Connections - Socially Isolated (04/06/2023)  Tobacco Use: Medium Risk (06/28/2023)     Readmission Risk Interventions    04/07/2023    1:23 PM 03/09/2023   11:21 AM 10/19/2022   10:12 AM  Readmission Risk Prevention Plan  Transportation Screening Complete  Complete Complete  Medication Review Oceanographer) Complete Complete Complete  PCP or Specialist appointment within 3-5 days of discharge  Complete Complete  HRI or Home Care Consult Complete Complete Complete  SW Recovery Care/Counseling Consult Complete Complete Complete  Palliative Care Screening Not Applicable Not Applicable Not Applicable  Skilled Nursing Facility Not Applicable Not Applicable Not Applicable

## 2023-07-05 NOTE — Plan of Care (Signed)

## 2023-07-06 ENCOUNTER — Other Ambulatory Visit: Payer: Medicare (Managed Care) | Admitting: Licensed Clinical Social Worker

## 2023-07-10 ENCOUNTER — Other Ambulatory Visit: Payer: Self-pay | Admitting: Family Medicine

## 2023-07-10 DIAGNOSIS — R600 Localized edema: Secondary | ICD-10-CM

## 2023-07-11 ENCOUNTER — Emergency Department (HOSPITAL_COMMUNITY)
Admission: EM | Admit: 2023-07-11 | Discharge: 2023-07-12 | Disposition: A | Discharge status: Home / Self Care | Payer: Medicare (Managed Care) | Attending: Student | Admitting: Student

## 2023-07-11 ENCOUNTER — Emergency Department (HOSPITAL_COMMUNITY): Payer: Medicare (Managed Care)

## 2023-07-11 ENCOUNTER — Other Ambulatory Visit: Payer: Self-pay

## 2023-07-11 ENCOUNTER — Encounter (HOSPITAL_COMMUNITY): Payer: Self-pay

## 2023-07-11 DIAGNOSIS — N760 Acute vaginitis: Secondary | ICD-10-CM | POA: Insufficient documentation

## 2023-07-11 DIAGNOSIS — B3731 Acute candidiasis of vulva and vagina: Secondary | ICD-10-CM | POA: Diagnosis not present

## 2023-07-11 DIAGNOSIS — I129 Hypertensive chronic kidney disease with stage 1 through stage 4 chronic kidney disease, or unspecified chronic kidney disease: Secondary | ICD-10-CM | POA: Insufficient documentation

## 2023-07-11 DIAGNOSIS — B9689 Other specified bacterial agents as the cause of diseases classified elsewhere: Secondary | ICD-10-CM | POA: Diagnosis not present

## 2023-07-11 DIAGNOSIS — D508 Other iron deficiency anemias: Secondary | ICD-10-CM | POA: Diagnosis not present

## 2023-07-11 DIAGNOSIS — R300 Dysuria: Secondary | ICD-10-CM | POA: Diagnosis present

## 2023-07-11 DIAGNOSIS — N1831 Chronic kidney disease, stage 3a: Secondary | ICD-10-CM | POA: Insufficient documentation

## 2023-07-11 LAB — URINALYSIS, ROUTINE W REFLEX MICROSCOPIC
Bacteria, UA: NONE SEEN
Bilirubin Urine: NEGATIVE
Glucose, UA: NEGATIVE mg/dL
Ketones, ur: NEGATIVE mg/dL
Leukocytes,Ua: NEGATIVE
Nitrite: NEGATIVE
Protein, ur: NEGATIVE mg/dL
Specific Gravity, Urine: 1.01 (ref 1.005–1.030)
pH: 5 (ref 5.0–8.0)

## 2023-07-11 LAB — CBC
HCT: 36.5 % (ref 36.0–46.0)
Hemoglobin: 11.4 g/dL — ABNORMAL LOW (ref 12.0–15.0)
MCH: 29.6 pg (ref 26.0–34.0)
MCHC: 31.2 g/dL (ref 30.0–36.0)
MCV: 94.8 fL (ref 80.0–100.0)
Platelets: 154 10*3/uL (ref 150–400)
RBC: 3.85 MIL/uL — ABNORMAL LOW (ref 3.87–5.11)
RDW: 16.5 % — ABNORMAL HIGH (ref 11.5–15.5)
WBC: 6.3 10*3/uL (ref 4.0–10.5)
nRBC: 0 % (ref 0.0–0.2)

## 2023-07-11 LAB — BASIC METABOLIC PANEL WITH GFR
Anion gap: 9 (ref 5–15)
BUN: 26 mg/dL — ABNORMAL HIGH (ref 8–23)
CO2: 20 mmol/L — ABNORMAL LOW (ref 22–32)
Calcium: 8.6 mg/dL — ABNORMAL LOW (ref 8.9–10.3)
Chloride: 106 mmol/L (ref 98–111)
Creatinine, Ser: 1.43 mg/dL — ABNORMAL HIGH (ref 0.44–1.00)
GFR, Estimated: 41 mL/min — ABNORMAL LOW (ref >60)
Glucose, Bld: 152 mg/dL — ABNORMAL HIGH (ref 70–99)
Potassium: 3.4 mmol/L — ABNORMAL LOW (ref 3.5–5.1)
Sodium: 135 mmol/L (ref 135–145)

## 2023-07-11 LAB — AMMONIA: Ammonia: 100 umol/L — ABNORMAL HIGH (ref 9–35)

## 2023-07-11 MED ORDER — SODIUM CHLORIDE 0.9 % IV BOLUS
1000.0000 mL | Freq: Once | INTRAVENOUS | Status: AC
  Administered 2023-07-11: 1000 mL via INTRAVENOUS

## 2023-07-11 NOTE — ED Provider Notes (Signed)
 [name] EMERGENCY DEPARTMENT AT [facility] Provider Note   CSN: [id number] Arrival date & time: 07/11/23  8079     Patient presents with: Dysuria   [name] is a 68 y.o. female.  {Add pertinent medical, surgical, social history, OB history to HPI:32947}  Dysuria      Prior to Admission medications   Medication Sig Start Date End Date Taking? Authorizing Provider  albuterol  (VENTOLIN  HFA) 108 (90 Base) MCG/ACT inhaler Inhale 2 puffs into the lungs every 6 (six) hours as needed for shortness of breath or wheezing. 02/13/23   [provider]  atorvastatin  (LIPITOR) 40 MG tablet Take 1 tablet (40 mg total) by mouth in the morning. 02/22/23   [name], MD  copper  tablet Take 2 mg by mouth in the morning and at bedtime.    [provider]  cyanocobalamin  (VITAMIN B12) 1000 MCG tablet Take 2,000 mcg by mouth every Monday, Wednesday, and Friday.    [provider]  dicyclomine  (BENTYL ) 10 MG capsule Take 1 capsule (10 mg total) by mouth 3 (three) times daily as needed for spasms (abdominal cramping). 09/09/22   Patsy Lenis, MD  famotidine  (PEPCID ) 20 MG tablet Take 20 mg by mouth in the morning.    [provider]  folic acid  (FOLVITE ) 1 MG tablet Take 1 tablet (1 mg total) by mouth daily. 06/16/22   Rizwan, [name], MD  furosemide  (LASIX ) 40 MG tablet TAKE 1 TABLET(40 MG) BY MOUTH TWICE DAILY 07/10/23   [name], MD  HYDROcodone -acetaminophen  (NORCO) 10-325 MG tablet Take 1 tablet by mouth every 8 (eight) hours as needed for moderate pain (pain score 4-6). 06/15/23   [provider]  IRON -VITAMIN C  PO Take 1 tablet by mouth daily with breakfast.    [provider]  lactulose  (CHRONULAC ) 10 GM/15ML solution Take 45 mLs (30 g total) by mouth 3 (three) times daily. Take to ensure that there is 2 loose bowel movement EVERY DAY 05/29/23   Cheryle Page, MD  levothyroxine  (SYNTHROID ) 175 MCG tablet Take 1 tablet (175  mcg total) by mouth daily at 6 (six) AM. Recheck TSH with PCP in 4 weeks 06/26/23   [name], MD  lidocaine  (LIDODERM ) 5 % Place 1 patch onto the skin daily. Remove & Discard patch within 12 hours or as directed by MD Patient taking differently: Place 1 patch onto the skin daily as needed (for pain- Remove & Discard patch within 12 hours or as directed by MD). 10/31/22   Tobie Yetta HERO, MD  magnesium  gluconate (MAGONATE) 500 (27 Mg) MG TABS tablet Take 1 tablet (500 mg total) by mouth in the morning and at bedtime. Patient taking differently: Take 1,000 mg by mouth daily. 05/01/23   [name], MD  meclizine  (ANTIVERT ) 25 MG tablet Take 1 tablet (25 mg total) by mouth 3 (three) times daily as needed for dizziness. 05/29/23   Cheryle Page, MD  megestrol  (MEGACE ) 40 MG tablet Take 1 tablet (40 mg total) by mouth 2 (two) times daily. 05/04/23   Ajewole, [name], MD  methocarbamol  (ROBAXIN ) 500 MG tablet Take 500 mg by mouth.    [provider]  midodrine  (PROAMATINE ) 10 MG tablet TAKE 1 TABLET(10 MG) BY MOUTH THREE TIMES DAILY WITH MEALS 06/26/23   [name], MD  Multiple Vitamins-Minerals (CENTRUM SILVER ULTRA WOMENS PO) Take 1 tablet by mouth in the morning.    [provider]  naloxone [facility]) nasal spray 4 mg/0.1 mL Place 1 spray into  the nose as needed (accidental overdose). 03/25/22   [provider]  nystatin  cream (MYCOSTATIN ) Apply 1 Application topically 2 (two) times daily as needed for dry skin. 05/29/23   Cheryle Page, MD  pantoprazole  (PROTONIX ) 40 MG tablet Take 1 tablet (40 mg total) by mouth 2 (two) times daily before a meal. 06/09/23   [name], MD  polyethylene glycol (MIRALAX  / GLYCOLAX ) 17 g packet Take 17 g by mouth daily as needed for moderate constipation. Patient not taking: Reported on 06/27/2023 05/29/23   Cheryle Page, MD  potassium chloride  (KLOR-CON ) 10 MEQ tablet Take 20 mEq by mouth 2 (two) times daily. 02/22/23    [provider]  rifaximin  (XIFAXAN ) 550 MG TABS tablet Take 550 mg by mouth 2 (two) times daily.    [provider]  rOPINIRole  (REQUIP ) 0.25 MG tablet TAKE 1 TABLET(0.25 MG) BY MOUTH AT BEDTIME 06/10/23   [name], MD  senna-docusate (SENOKOT-S) 8.6-50 MG tablet Take 1 tablet by mouth 2 (two) times daily. 06/26/23   [name], MD  spironolactone  (ALDACTONE ) 50 MG tablet Take 1 tablet (50 mg total) by mouth daily. 06/26/23 07/26/23  Cottie Donnice PARAS, MD  TOBRADEX ophthalmic ointment Place 1 Application into the left eye daily as needed. 05/02/23   [provider]  venlafaxine  XR (EFFEXOR -XR) 75 MG 24 hr capsule Take 1 capsule (75 mg total) by mouth daily with breakfast. 04/21/23   [name], MD  Vitamin D, Ergocalciferol, (DRISDOL) 50000 UNITS CAPS Take 50,000 Units by mouth every Wednesday.    [provider]    Allergies: Ace inhibitors, Ivp dye [iodinated contrast media], Desitin [zinc  oxide], Gadolinium derivatives, Hydroxyzine , Penicillin g, Yellow jacket venom, Chlorhexidine , Doxycycline, Penicillins, and Zofran  [ondansetron ]    Review of Systems  Genitourinary:  Positive for dysuria.    Updated Vital Signs BP (!) 152/80 (BP Location: Left Arm)   Pulse 81   Temp 99 F (37.2 C) (Oral)   Resp 18   Ht 5' 2 (1.575 m)   Wt 94.3 kg   SpO2 100%   BMI 38.02 kg/m   Physical Exam  (all labs ordered are listed, but only abnormal results are displayed) Labs Reviewed  URINALYSIS, ROUTINE W REFLEX MICROSCOPIC - Abnormal; Notable for the following components:      Result Value   Hgb urine dipstick SMALL (*)    All other components within normal limits  BASIC METABOLIC PANEL WITH GFR - Abnormal; Notable for the following components:   Potassium 3.4 (*)    CO2 20 (*)    Glucose, Bld 152 (*)    BUN 26 (*)    Creatinine, Ser 1.43 (*)    Calcium  8.6 (*)    GFR, Estimated 41 (*)    All other components within normal limits  CBC -  Abnormal; Notable for the following components:   RBC 3.85 (*)    Hemoglobin 11.4 (*)    RDW 16.5 (*)    All other components within normal limits    EKG: None  Radiology: No results found.  Procedures   Medications Ordered in the ED - No data to display    {Click here for ABCD2, HEART and other calculators REFRESH Note before signing:1}                              Medical Decision Making  This patient is a ***  who presents to the ED for concern  of ***.   Differential diagnoses prior to evaluation: The emergent differential diagnosis includes, but is not limited to,  *** . This is not an exhaustive differential.   Past Medical History / Co-morbidities / Social History: HTN, PE, nephrolithiasis, hyperammonia, hepatic encephalopathy, NASH cirrhosis, DVT, corneal perforation of left eye, short gut syndrome, thyroid  disease, CKD 3 AA  Status post cholecystectomy, ileostomy closure, panniculectomy, liver resection  Additional history: Chart reviewed. Pertinent results include:   Noted to have an iron  infusion today for iron  deficiency anemia 06/28/2023 was admitted to the hospital for 7 days for hepatic encephalopathy.  Currently on lactulose  30 mg 3 times daily with rifaximin .  Also noted to be on 20 mg Lasix  and Aldactone , midodrine  for portal hypertension.  Lab Tests/Imaging studies: I personally interpreted labs/imaging and the pertinent results include:  ***.   ***I agree with the radiologist interpretation.  Cardiac monitoring: EKG obtained and interpreted by myself and attending physician which shows: ***   Medications: I ordered medication including ***.  I have reviewed the patients home medicines and have made adjustments as needed.  Critical Interventions:  Social Determinants of Health:  Disposition: After consideration of the diagnostic results and the patients response to treatment, I feel that the patient would benefit from ***.   ***emergency  department workup does not suggest an emergent condition requiring admission or immediate intervention beyond what has been performed at this time. The plan is: ***. The patient is safe for discharge and has been instructed to return immediately for worsening symptoms, change in symptoms or any other concerns.   {Document critical care time when appropriate  Document review of labs and clinical decision tools ie CHADS2VASC2, etc  Document your independent review of radiology images and any outside records  Document your discussion with family members, caretakers and with consultants  Document social determinants of health affecting pt's care  Document your decision making why or why not admission, treatments were needed:32947:::1}   Final diagnoses:  None    ED Discharge Orders     None

## 2023-07-11 NOTE — ED Triage Notes (Addendum)
 Pt has been complaining of burning with urination for 2 days. Had iron  infusion today. Pt son reports pt is sleepier than usual and she has been c/o nausea,

## 2023-07-12 LAB — GC/CHLAMYDIA PROBE AMP (~~LOC~~) NOT AT ARMC
Chlamydia: NEGATIVE
Comment: NEGATIVE
Comment: NORMAL
Neisseria Gonorrhea: NEGATIVE

## 2023-07-12 LAB — WET PREP, GENITAL
Sperm: NONE SEEN
Trich, Wet Prep: NONE SEEN
WBC, Wet Prep HPF POC: <10 (ref <10)

## 2023-07-12 MED ORDER — METRONIDAZOLE 500 MG PO TABS
500.0000 mg | ORAL_TABLET | Freq: Once | ORAL | Status: AC
  Administered 2023-07-12: 500 mg via ORAL
  Filled 2023-07-12: qty 1

## 2023-07-12 MED ORDER — METRONIDAZOLE 500 MG PO TABS: 500.0000 mg | ORAL_TABLET | Freq: Two times a day (BID) | ORAL | 0 refills | Status: DC

## 2023-07-12 MED ORDER — FLUCONAZOLE 150 MG PO TABS
150.0000 mg | ORAL_TABLET | Freq: Once | ORAL | Status: AC
  Administered 2023-07-12: 150 mg via ORAL
  Filled 2023-07-12: qty 1

## 2023-07-12 MED ORDER — LACTULOSE 10 GM/15ML PO SOLN
30.0000 g | Freq: Once | ORAL | Status: AC
  Administered 2023-07-12: 30 g via ORAL
  Filled 2023-07-12: qty 60

## 2023-07-12 NOTE — Discharge Instructions (Addendum)
 You were seen today for vaginal candidiasis as well as bacterial vaginosis.  You provided lactulose  as well as metronidazole  and fluconazole .  I am sending home metronidazole  which you are to take for the next 7 days.  Please be sure to follow-up with the PCP for further management of both your cirrhosis as well as other medications since you had not been able to see PCP since being discharged from hospital.  Ammonia levels have actually improved today when compared to previous. Continue with current regimens provided to you from the hospital for NASH cirrhosis.  With your current presentation remaining fairly unchanged from when you previously discharged from the hospital, did not really require admission at this time.  However recommend you continue to follow-up with hepatologist as well as PCP.  Return to the ED if you need to have any new or worsening symptoms.

## 2023-07-19 ENCOUNTER — Ambulatory Visit: Payer: Medicare (Managed Care)

## 2023-07-20 ENCOUNTER — Other Ambulatory Visit: Payer: Self-pay | Admitting: Family Medicine

## 2023-07-20 ENCOUNTER — Other Ambulatory Visit: Payer: Self-pay

## 2023-07-20 ENCOUNTER — Other Ambulatory Visit (HOSPITAL_COMMUNITY): Payer: Self-pay

## 2023-07-20 MED ORDER — VENLAFAXINE HCL ER 75 MG PO CP24
75.0000 mg | ORAL_CAPSULE | Freq: Every day | ORAL | 0 refills | Status: DC
  Filled 2023-07-20: qty 90, 90d supply, fill #0

## 2023-07-21 DIAGNOSIS — D508 Other iron deficiency anemias: Secondary | ICD-10-CM | POA: Diagnosis not present

## 2023-07-24 ENCOUNTER — Ambulatory Visit: Payer: Medicare (Managed Care) | Admitting: Family Medicine

## 2023-07-24 ENCOUNTER — Encounter: Payer: Self-pay | Admitting: Family Medicine

## 2023-07-24 VITALS — BP 116/78 | HR 68 | Temp 98.6°F | Ht 62.0 in | Wt 205.0 lb

## 2023-07-24 DIAGNOSIS — I9589 Other hypotension: Secondary | ICD-10-CM | POA: Diagnosis not present

## 2023-07-24 DIAGNOSIS — E785 Hyperlipidemia, unspecified: Secondary | ICD-10-CM

## 2023-07-24 DIAGNOSIS — E669 Obesity, unspecified: Secondary | ICD-10-CM | POA: Diagnosis not present

## 2023-07-24 DIAGNOSIS — K7460 Unspecified cirrhosis of liver: Secondary | ICD-10-CM

## 2023-07-24 DIAGNOSIS — Z79899 Other long term (current) drug therapy: Secondary | ICD-10-CM

## 2023-07-24 DIAGNOSIS — D509 Iron deficiency anemia, unspecified: Secondary | ICD-10-CM | POA: Diagnosis not present

## 2023-07-24 DIAGNOSIS — K7682 Hepatic encephalopathy: Secondary | ICD-10-CM

## 2023-07-24 DIAGNOSIS — K529 Noninfective gastroenteritis and colitis, unspecified: Secondary | ICD-10-CM

## 2023-07-24 NOTE — Progress Notes (Unsigned)
 Patient Office Visit  Assessment & Plan:  Acute hepatic encephalopathy (HCC) -     Comprehensive metabolic panel with GFR -     Ammonia  Iron  deficiency anemia, unspecified iron  deficiency anemia type -     CBC with Differential/Platelet  Hyperlipidemia, unspecified hyperlipidemia type -     Lipid panel  Polypharmacy  Hepatic cirrhosis, unspecified hepatic cirrhosis type, unspecified whether ascites present (HCC) -     TSH -     VITAMIN D  25 Hydroxy (Vit-D Deficiency, Fractures)  Chronic diarrhea -     CBC with Differential/Platelet -     Comprehensive metabolic panel with GFR -     TSH -     VITAMIN D  25 Hydroxy (Vit-D Deficiency, Fractures) -     Magnesium   Chronic hypotension -     Magnesium    Assessment and Plan    Iron  Deficiency Anemia chronic  Receiving weekly IV iron  infusions. Adverse reaction to Benadryl  led to altered mental status. Continued infusions without Benadryl , no adverse reactions noted. - Continue weekly IV iron  infusions without Benadryl  premedication.  Cirrhosis Monitored with CT scans every six months. Recent scan showed no acute changes. Doppler ultrasound confirmed patent portal vein. Monitoring for hepatic encephalopathy, managed with lactulose . - Continue lactulose  to maintain 5-7 bowel movements per day. - Schedule CT scan of the abdomen in six months.  Hepatic encephalopathy Monitored with cirrhosis. Regular GI follow-ups every six months. No acute changes on imaging. - Continue regular GI follow-ups every six months. patient is on lactulose  and needs ammonia levels checked regularly.   Chronic Diarrhea Managed with lactulose  to prevent hepatic encephalopathy. Aware of dehydration signs and lactulose  dosage adjustment. - Adjust lactulose  dosage as needed to maintain 5-7 bowel movements per day.  Hernia Recurrent hernia at previous surgery site. Symptomatic but high surgical risk due to medical history. Surgery not recommended unless  acute complication arises. - Monitor for signs of bowel obstruction or acute complications.  General Health Maintenance Advised on regular eating schedule to prevent hypoglycemia. Discussed importance of balanced diet and avoiding junk food. - Encourage regular meals and a balanced diet. - Avoid junk food.  Follow-up Advised to return in three months for monitoring of medical conditions and treatment plans. - Schedule follow-up visit in three months.          No follow-ups on file.   Subjective:    Patient ID: [name], female    DOB: July 18, 1955  Age: 68 y.o. MRN: [id number]  No chief complaint on file.   HPI Discussed the use of AI scribe software for clinical note transcription with the patient, who gave verbal consent to proceed.  History of Present Illness        [name] is a 68 year old female with cirrhosis and iron  deficiency who presents for follow-up after recent hospitalization. patient is feeling much better since hospital discharge. Patient has been hospitalized about 1-2 per month the past few months. Patient has seen [facility] since hospital discharge Dr. Ladora re cirrhosis/hepatic encephalopathy.   She has a history of cirrhosis and iron  deficiency, recently receiving IV iron  treatment without Benadryl  due to a previous adverse reaction that led to hospitalization. She is scheduled for three more weekly iron  infusions. patient is feeling better overall and thinks she is back to her usual baseline  She recently experienced a vaginal yeast infection and was treated with antibiotics. Her appetite is variable, described as 'half and half,' and she tends to  eat late at night. Her caregiver her son notes a preference for junk food, often brought by a tenant.  She has a history of three hernias, with the last repair involving pig skin. The hernia has recurred, causing pain, but surgery is advised against unless there is a blockage due to her complex medical  history.  She experiences chronic diarrhea, having five to seven bowel movements daily, managed with lactulose . Her caregiver monitors her bowel movements to prevent dehydration. patient has had ammonia levels checked while in the hospital and was told that her levels do not always correlate with her mental status.   She has a history of cirrhosis and hepatic encephalopathy  requiring scans every six months. Recent imaging, including a CT scan and ultrasound with Doppler ultrasound, showed stable cirrhosis with no acute changes and a patent portal vein.  Her blood pressure has been low, especially during hospitalization, due to withholding medications but needs to take diuretics, leading to dizziness and extended hospital stays. Patient has been taking midridine  She has a history of a chest tube placement for fluid drainage, resulting in chronic lung sounds especially left side. She also has a heart murmur first noted during pregnancy. Physical Exam CHEST: Crackles and fluid in the left lung. CARDIOVASCULAR: Heart murmur present. Results RADIOLOGY   CT scan abdomen: Cirrhosis, right nephrolithiasis, no acute findings (06/28/2023)   Doppler ultrasound liver: Cirrhotic liver, patent portal vein (April 2025) Assessment & Plan Iron  Deficiency Anemia chronic  Receiving weekly IV iron  infusions. Adverse reaction to Benadryl  led to altered mental status. Continued infusions without Benadryl , no adverse reactions noted. - Continue weekly IV iron  infusions without Benadryl  premedication.  Cirrhosis Monitored with CT scans every six months. Recent scan showed no acute changes. Doppler ultrasound confirmed patent portal vein. Monitoring for hepatic encephalopathy, managed with lactulose . - Continue lactulose  to maintain 5-7 bowel movements per day. - Schedule CT scan of the abdomen in six months.  Hepatic encephalopathy Monitored with cirrhosis. Regular GI follow-ups every six months. No acute  changes on imaging. - Continue regular GI follow-ups every six months. patient is on lactulose  and needs ammonia levels checked regularly.   Chronic Diarrhea Managed with lactulose  to prevent hepatic encephalopathy. Aware of dehydration signs and lactulose  dosage adjustment. - Adjust lactulose  dosage as needed to maintain 5-7 bowel movements per day.  Hernia Recurrent hernia at previous surgery site. Symptomatic but high surgical risk due to medical history. Surgery not recommended unless acute complication arises. - Monitor for signs of bowel obstruction or acute complications.  General Health Maintenance Advised on regular eating schedule to prevent hypoglycemia. Discussed importance of balanced diet and avoiding junk food. - Encourage regular meals and a balanced diet. - Avoid junk food.  Follow-up Advised to return in three months for monitoring of medical conditions and treatment plans. - Schedule follow-up visit in three months.    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   Hyperammonemia (HCC) 09/30/2022   Hypertension    Kidney stone    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   Obesity    Pulmonary emboli (HCC) 01/10/2009   Pulmonary embolism (HCC)    Short gut syndrome    Thyroid  disease    Past  Surgical History:  Procedure Laterality Date   ABDOMINAL WALL MESH  REMOVAL     2016   CESAREAN SECTION WITH BILATERAL TUBAL LIGATION  04/30/1985   CHOLECYSTECTOMY  05/2007   Exploratory laparotomy, lysis of adhesions, takedown of ileostomy with small bowel resection, open cholecystectomy and ileocolostomy   COLON SURGERY  01/2007   exploratory laparotomy with right colecotmy and end ileostomy   COLONOSCOPY  04/02/2019   Dr Gunnar Chris Daniels, MD [facility]   ESOPHAGOGASTRODUODENOSCOPY (EGD) WITH PROPOFOL  N/A 10/19/2022   Procedure: ESOPHAGOGASTRODUODENOSCOPY (EGD) WITH PROPOFOL ;  Surgeon: Saintclair Jasper, MD;  Location: [facility];  Service: Gastroenterology;  Laterality: N/A;   ESOPHAGOSCOPY  04/02/2019   EYE SURGERY Left 10/06/2020   Cataract extraciton w/intraocular lens implant- Dr Caresse   HERNIA REPAIR  10/2007   ILEOSTOMY  2009   states bowel perfotation wih colonsocopy   ILEOSTOMY CLOSURE  2009   INCISIONAL HERNIA REPAIR  10/11/2018   IR KYPHO THORACIC WITH BONE BIOPSY  08/31/2022   IR KYPHO THORACIC WITH BONE BIOPSY  10/27/2022   IR RADIOLOGIST EVAL & MGMT  09/27/2022   KIDNEY STONE SURGERY     LIVER RESECTION     2009   PANNICULECTOMY     repair of abdominal wall hernia   WRIST SURGERY     tendonitis   Social History   Tobacco Use   Smoking status: Former    Current packs/day: 0.00    Types: Cigarettes    Quit date: 1987    Years since quitting: 38.5    Passive exposure: Past   Smokeless tobacco: Never  Vaping Use   Vaping status: Never Used  Substance Use Topics   Alcohol use: No   Drug use: No   Family History  Problem Relation Age of Onset   Uterine cancer Mother    Bone cancer Father    Heart disease Father    Hyperlipidemia Father    Dementia Father    Hypertension Father    Skin cancer Sister    Hypertension Sister    Diabetes Sister    Hyperlipidemia Brother    Hypertension Brother    Heart disease Brother    Allergies  Allergen Reactions    Ace Inhibitors Anaphylaxis and Swelling   Ivp Dye [Iodinated Contrast Media] Anaphylaxis   Desitin [Zinc  Oxide] Rash and Other (See Comments)    Worsens rash   Gadolinium Derivatives Other (See Comments)    Reaction??   Hydroxyzine  Other (See Comments)    Affects the mind   Penicillin G Dermatitis   Yellow Jacket Venom Swelling   Chlorhexidine  Rash and Other (See Comments)    WORSENS RASHES   Doxycycline Rash   Penicillins Rash   Zofran  [Ondansetron ] Rash    ROS    Objective:    BP 116/78   Pulse 68   Temp 98.6 F (37 C)   Ht 5' 2 (1.575 m)   Wt 205 lb (93 kg)   SpO2 99%   BMI 37.49 kg/m  BP Readings from Last 3 Encounters:  07/24/23  116/78  07/12/23 (!) 138/58  07/05/23 134/73   Wt Readings from Last 3 Encounters:  07/24/23 205 lb (93 kg)  07/11/23 207 lb 14.3 oz (94.3 kg)  07/05/23 208 lb 8.9 oz (94.6 kg)    Physical Exam Vitals and nursing note reviewed.  Constitutional:      General: She is not in acute distress.    Appearance: Normal appearance.     Comments: In a wheelchair, comes in with her son  HENT:     Head: Normocephalic.     Right Ear: Tympanic membrane, ear canal and external ear normal.     Left Ear: Tympanic membrane, ear canal and external ear normal.  Eyes:     Extraocular Movements: Extraocular movements intact.     Pupils: Pupils are equal, round, and reactive to light.     Comments: Left eye opaque  Cardiovascular:     Rate and Rhythm: Normal rate and regular rhythm.     Heart sounds: Murmur heard.  Pulmonary:     Effort: Pulmonary effort is normal.     Breath sounds: Rhonchi present. No wheezing.     Comments: Left lower lobe has crackles.  Musculoskeletal:     Right lower leg: No edema.     Left lower leg: No edema.  Neurological:     General: No focal deficit present.     Mental Status: She is alert and oriented to person, place, and time.  Psychiatric:        Mood and Affect: Mood normal.        Behavior: Behavior  normal.        Thought Content: Thought content normal.      Results for orders placed or performed in visit on 07/24/23  CBC with Differential/Platelet  Result Value Ref Range   WBC 5.1 3.8 - 10.8 Thousand/uL   RBC 3.60 (L) 3.80 - 5.10 Million/uL   Hemoglobin 10.7 (L) 11.7 - 15.5 g/dL   HCT 65.5 (L) 64.9 - 54.9 %   MCV 95.6 80.0 - 100.0 fL   MCH 29.7 27.0 - 33.0 pg   MCHC 31.1 (L) 32.0 - 36.0 g/dL   RDW 85.3 88.9 - 84.9 %   Platelets 129 (L) 140 - 400 Thousand/uL   MPV 11.0 7.5 - 12.5 fL   Neutro Abs 2,718 1,500 - 7,800 cells/uL   Absolute Lymphocytes 1,535 850 - 3,900 cells/uL   Absolute Monocytes 413 200 - 950 cells/uL   Eosinophils Absolute 383 15 - 500 cells/uL   Basophils Absolute 51 0 - 200 cells/uL   Neutrophils Relative % 53.3 %   Total Lymphocyte 30.1 %   Monocytes Relative 8.1 %   Eosinophils Relative 7.5 %   Basophils Relative 1.0 %  Comprehensive metabolic panel with GFR  Result Value Ref Range   Glucose, Bld 83 65 - 99 mg/dL   BUN 26 (H) 7 - 25 mg/dL   Creat 8.86 (H) 9.49 - 1.05 mg/dL   eGFR 54 (L) > OR = 60 mL/min/1.21m2   BUN/Creatinine Ratio 23 (H) 6 - 22 (calc)   Sodium 137 135 - 146 mmol/L   Potassium 4.5 3.5 - 5.3 mmol/L   Chloride 111 (H) 98 - 110 mmol/L   CO2 21 20 - 32 mmol/L   Calcium  8.5 (L) 8.6 - 10.4 mg/dL   Total Protein 6.0 (L) 6.1 - 8.1 g/dL   Albumin  2.9 (L) 3.6 - 5.1 g/dL   Globulin 3.1 1.9 - 3.7 g/dL (calc)  AG Ratio 0.9 (L) 1.0 - 2.5 (calc)   Total Bilirubin 0.8 0.2 - 1.2 mg/dL   Alkaline phosphatase (APISO) 134 37 - 153 U/L   AST 36 (H) 10 - 35 U/L   ALT 20 6 - 29 U/L  Lipid panel  Result Value Ref Range   Cholesterol 95 <200 mg/dL   HDL 53 > OR = 50 mg/dL   Triglycerides 44 <849 mg/dL   LDL Cholesterol (Calc) 29 mg/dL (calc)   Total CHOL/HDL Ratio 1.8 <5.0 (calc)   Non-HDL Cholesterol (Calc) 42 <869 mg/dL (calc)  TSH  Result Value Ref Range   TSH 2.77 0.40 - 4.50 mIU/L  VITAMIN D  25 Hydroxy (Vit-D Deficiency, Fractures)   Result Value Ref Range   Vit D, 25-Hydroxy 37 30 - 100 ng/mL  Magnesium   Result Value Ref Range   Magnesium  1.8 1.5 - 2.5 mg/dL

## 2023-07-25 ENCOUNTER — Ambulatory Visit: Payer: Self-pay | Admitting: Family Medicine

## 2023-07-25 LAB — CBC WITH DIFFERENTIAL/PLATELET
Absolute Lymphocytes: 1535 {cells}/uL (ref 850–3900)
Absolute Monocytes: 413 {cells}/uL (ref 200–950)
Basophils Absolute: 51 {cells}/uL (ref 0–200)
Basophils Relative: 1 %
Eosinophils Absolute: 383 {cells}/uL (ref 15–500)
Eosinophils Relative: 7.5 %
HCT: 34.4 % — ABNORMAL LOW (ref 35.0–45.0)
Hemoglobin: 10.7 g/dL — ABNORMAL LOW (ref 11.7–15.5)
MCH: 29.7 pg (ref 27.0–33.0)
MCHC: 31.1 g/dL — ABNORMAL LOW (ref 32.0–36.0)
MCV: 95.6 fL (ref 80.0–100.0)
MPV: 11 fL (ref 7.5–12.5)
Monocytes Relative: 8.1 %
Neutro Abs: 2718 {cells}/uL (ref 1500–7800)
Neutrophils Relative %: 53.3 %
Platelets: 129 Thousand/uL — ABNORMAL LOW (ref 140–400)
RBC: 3.6 Million/uL — ABNORMAL LOW (ref 3.80–5.10)
RDW: 14.6 % (ref 11.0–15.0)
Total Lymphocyte: 30.1 %
WBC: 5.1 Thousand/uL (ref 3.8–10.8)

## 2023-07-25 LAB — LIPID PANEL
Cholesterol: 95 mg/dL (ref <200)
HDL: 53 mg/dL (ref >=50)
LDL Cholesterol (Calc): 29 mg/dL
Non-HDL Cholesterol (Calc): 42 mg/dL (ref <130)
Total CHOL/HDL Ratio: 1.8 (calc) (ref <5.0)
Triglycerides: 44 mg/dL (ref <150)

## 2023-07-25 LAB — COMPREHENSIVE METABOLIC PANEL WITH GFR
AG Ratio: 0.9 (calc) — ABNORMAL LOW (ref 1.0–2.5)
ALT: 20 U/L (ref 6–29)
AST: 36 U/L — ABNORMAL HIGH (ref 10–35)
Albumin: 2.9 g/dL — ABNORMAL LOW (ref 3.6–5.1)
Alkaline phosphatase (APISO): 134 U/L (ref 37–153)
BUN/Creatinine Ratio: 23 (calc) — ABNORMAL HIGH (ref 6–22)
BUN: 26 mg/dL — ABNORMAL HIGH (ref 7–25)
CO2: 21 mmol/L (ref 20–32)
Calcium: 8.5 mg/dL — ABNORMAL LOW (ref 8.6–10.4)
Chloride: 111 mmol/L — ABNORMAL HIGH (ref 98–110)
Creat: 1.13 mg/dL — ABNORMAL HIGH (ref 0.50–1.05)
Globulin: 3.1 g/dL (ref 1.9–3.7)
Glucose, Bld: 83 mg/dL (ref 65–99)
Potassium: 4.5 mmol/L (ref 3.5–5.3)
Sodium: 137 mmol/L (ref 135–146)
Total Bilirubin: 0.8 mg/dL (ref 0.2–1.2)
Total Protein: 6 g/dL — ABNORMAL LOW (ref 6.1–8.1)
eGFR: 54 mL/min/1.73m2 — ABNORMAL LOW (ref >=60)

## 2023-07-25 LAB — MAGNESIUM: Magnesium: 1.8 mg/dL (ref 1.5–2.5)

## 2023-07-25 LAB — VITAMIN D 25 HYDROXY (VIT D DEFICIENCY, FRACTURES): Vit D, 25-Hydroxy: 37 ng/mL (ref 30–100)

## 2023-07-25 LAB — AMMONIA: Ammonia: 64 umol/L (ref <=72)

## 2023-07-25 LAB — TSH: TSH: 2.77 m[IU]/L (ref 0.40–4.50)

## 2023-07-27 ENCOUNTER — Ambulatory Visit: Payer: Medicare (Managed Care) | Admitting: Obstetrics and Gynecology

## 2023-07-27 ENCOUNTER — Other Ambulatory Visit (HOSPITAL_COMMUNITY): Payer: Self-pay

## 2023-07-27 ENCOUNTER — Other Ambulatory Visit: Payer: Self-pay

## 2023-07-27 MED ORDER — EPINEPHRINE 0.3 MG/0.3ML IJ SOAJ: 0.3000 mg | INTRAMUSCULAR | 1 refills | Status: AC | PRN

## 2023-07-28 DIAGNOSIS — D508 Other iron deficiency anemias: Secondary | ICD-10-CM | POA: Diagnosis not present

## 2023-08-02 ENCOUNTER — Other Ambulatory Visit: Payer: Self-pay | Admitting: Family Medicine

## 2023-08-02 ENCOUNTER — Other Ambulatory Visit: Payer: Self-pay

## 2023-08-02 NOTE — Telephone Encounter (Signed)
 Requested medication (s) are due for refill today: yes  Requested medication (s) are on the active medication list: yes  Last refill:  07/03/23  Future visit scheduled: Yes  Notes to clinic:  Refilled by different provider.    Requested Prescriptions  Pending Prescriptions Disp Refills   spironolactone  (ALDACTONE ) 50 MG tablet [Pharmacy Med Name: SPIRONOLACTONE  50MG  TABLETS] 30 tablet 0    Sig: TAKE 1 TABLET(50 MG) BY MOUTH DAILY     Cardiovascular: Diuretics - Aldosterone Antagonist Failed - 08/02/2023  1:50 PM      Failed - Cr in normal range and within 180 days    Creat  Date Value Ref Range Status  07/24/2023 1.13 (H) 0.50 - 1.05 mg/dL Final   Creatinine, Urine  Date Value Ref Range Status  05/23/2023 39 mg/dL Final    Comment:    Performed at [facility], [address], [place], [place] [address]         Passed - K in normal range and within 180 days    Potassium  Date Value Ref Range Status  07/24/2023 4.5 3.5 - 5.3 mmol/L Final         Passed - Na in normal range and within 180 days    Sodium  Date Value Ref Range Status  07/24/2023 137 135 - 146 mmol/L Final         Passed - eGFR is 30 or above and within 180 days    GFR calc Af Amer  Date Value Ref Range Status  01/19/2019 >60 >60 mL/min Final   GFR, Estimated  Date Value Ref Range Status  07/11/2023 41 (L) >60 mL/min Final    Comment:    (NOTE) Calculated using the CKD-EPI Creatinine Equation (2021)    eGFR  Date Value Ref Range Status  07/24/2023 54 (L) > OR = 60 mL/min/1.65m2 Final         Passed - Last BP in normal range    BP Readings from Last 1 Encounters:  07/24/23 116/78         Passed - Valid encounter within last 6 months    Recent Outpatient Visits           1 week ago Acute hepatic encephalopathy [facility])   [place] [facility] [name], MD   3 months ago Edema of both lower legs   [place] [facility] [name], MD   4 months ago Pruritus   [place] [facility] [name], MD

## 2023-08-07 ENCOUNTER — Other Ambulatory Visit: Payer: Self-pay

## 2023-08-07 ENCOUNTER — Other Ambulatory Visit (HOSPITAL_COMMUNITY): Payer: Self-pay

## 2023-08-07 DIAGNOSIS — I9589 Other hypotension: Secondary | ICD-10-CM

## 2023-08-07 DIAGNOSIS — R600 Localized edema: Secondary | ICD-10-CM

## 2023-08-07 MED ORDER — DICYCLOMINE HCL 10 MG PO CAPS: 10.0000 mg | ORAL_CAPSULE | Freq: Three times a day (TID) | ORAL | 3 refills | Status: AC | PRN

## 2023-08-07 MED ORDER — FUROSEMIDE 40 MG PO TABS: 40.0000 mg | ORAL_TABLET | Freq: Two times a day (BID) | ORAL | 0 refills | Status: DC

## 2023-08-07 MED ORDER — POTASSIUM CHLORIDE ER 10 MEQ PO TBCR: 20.0000 meq | EXTENDED_RELEASE_TABLET | Freq: Two times a day (BID) | ORAL | 0 refills | Status: DC

## 2023-08-08 ENCOUNTER — Emergency Department (HOSPITAL_COMMUNITY): Payer: Medicare (Managed Care)

## 2023-08-08 ENCOUNTER — Encounter (HOSPITAL_COMMUNITY): Payer: Self-pay | Admitting: Emergency Medicine

## 2023-08-08 ENCOUNTER — Inpatient Hospital Stay (HOSPITAL_COMMUNITY)
Admission: EM | Admit: 2023-08-08 | Discharge: 2023-08-13 | DRG: 443 | Disposition: A | Discharge status: Home / Self Care | Payer: Medicare (Managed Care) | Attending: Family Medicine | Admitting: Family Medicine

## 2023-08-08 ENCOUNTER — Other Ambulatory Visit: Payer: Self-pay

## 2023-08-08 DIAGNOSIS — D259 Leiomyoma of uterus, unspecified: Secondary | ICD-10-CM | POA: Diagnosis present

## 2023-08-08 DIAGNOSIS — K7581 Nonalcoholic steatohepatitis (NASH): Secondary | ICD-10-CM | POA: Diagnosis not present

## 2023-08-08 DIAGNOSIS — Z881 Allergy status to other antibiotic agents status: Secondary | ICD-10-CM

## 2023-08-08 DIAGNOSIS — Z87891 Personal history of nicotine dependence: Secondary | ICD-10-CM

## 2023-08-08 DIAGNOSIS — K7682 Hepatic encephalopathy: Secondary | ICD-10-CM | POA: Diagnosis present

## 2023-08-08 DIAGNOSIS — Z66 Do not resuscitate: Secondary | ICD-10-CM | POA: Diagnosis present

## 2023-08-08 DIAGNOSIS — Z808 Family history of malignant neoplasm of other organs or systems: Secondary | ICD-10-CM

## 2023-08-08 DIAGNOSIS — N1831 Chronic kidney disease, stage 3a: Secondary | ICD-10-CM | POA: Diagnosis present

## 2023-08-08 DIAGNOSIS — Z833 Family history of diabetes mellitus: Secondary | ICD-10-CM

## 2023-08-08 DIAGNOSIS — Z880 Allergy status to penicillin: Secondary | ICD-10-CM

## 2023-08-08 DIAGNOSIS — Z8249 Family history of ischemic heart disease and other diseases of the circulatory system: Secondary | ICD-10-CM | POA: Diagnosis not present

## 2023-08-08 DIAGNOSIS — Z86711 Personal history of pulmonary embolism: Secondary | ICD-10-CM

## 2023-08-08 DIAGNOSIS — Z6835 Body mass index (BMI) 35.0-35.9, adult: Secondary | ICD-10-CM

## 2023-08-08 DIAGNOSIS — N939 Abnormal uterine and vaginal bleeding, unspecified: Secondary | ICD-10-CM

## 2023-08-08 DIAGNOSIS — Z8049 Family history of malignant neoplasm of other genital organs: Secondary | ICD-10-CM | POA: Diagnosis not present

## 2023-08-08 DIAGNOSIS — I9589 Other hypotension: Secondary | ICD-10-CM | POA: Diagnosis present

## 2023-08-08 DIAGNOSIS — K7460 Unspecified cirrhosis of liver: Secondary | ICD-10-CM | POA: Diagnosis not present

## 2023-08-08 DIAGNOSIS — G8929 Other chronic pain: Secondary | ICD-10-CM | POA: Diagnosis present

## 2023-08-08 DIAGNOSIS — Z79890 Hormone replacement therapy (postmenopausal): Secondary | ICD-10-CM | POA: Diagnosis not present

## 2023-08-08 DIAGNOSIS — Z91041 Radiographic dye allergy status: Secondary | ICD-10-CM

## 2023-08-08 DIAGNOSIS — Z83438 Family history of other disorder of lipoprotein metabolism and other lipidemia: Secondary | ICD-10-CM

## 2023-08-08 DIAGNOSIS — I129 Hypertensive chronic kidney disease with stage 1 through stage 4 chronic kidney disease, or unspecified chronic kidney disease: Secondary | ICD-10-CM | POA: Diagnosis present

## 2023-08-08 DIAGNOSIS — E66812 Obesity, class 2: Secondary | ICD-10-CM | POA: Diagnosis present

## 2023-08-08 DIAGNOSIS — E039 Hypothyroidism, unspecified: Secondary | ICD-10-CM | POA: Diagnosis present

## 2023-08-08 DIAGNOSIS — F411 Generalized anxiety disorder: Secondary | ICD-10-CM | POA: Diagnosis present

## 2023-08-08 DIAGNOSIS — Z79899 Other long term (current) drug therapy: Secondary | ICD-10-CM

## 2023-08-08 DIAGNOSIS — K90829 Short bowel syndrome, unspecified: Secondary | ICD-10-CM | POA: Diagnosis present

## 2023-08-08 DIAGNOSIS — Z9049 Acquired absence of other specified parts of digestive tract: Secondary | ICD-10-CM

## 2023-08-08 DIAGNOSIS — F32A Depression, unspecified: Secondary | ICD-10-CM | POA: Diagnosis present

## 2023-08-08 DIAGNOSIS — E785 Hyperlipidemia, unspecified: Secondary | ICD-10-CM | POA: Diagnosis not present

## 2023-08-08 DIAGNOSIS — Z79818 Long term (current) use of other agents affecting estrogen receptors and estrogen levels: Secondary | ICD-10-CM

## 2023-08-08 DIAGNOSIS — Z888 Allergy status to other drugs, medicaments and biological substances status: Secondary | ICD-10-CM

## 2023-08-08 DIAGNOSIS — Z91030 Bee allergy status: Secondary | ICD-10-CM

## 2023-08-08 DIAGNOSIS — K219 Gastro-esophageal reflux disease without esophagitis: Secondary | ICD-10-CM | POA: Diagnosis not present

## 2023-08-08 DIAGNOSIS — I959 Hypotension, unspecified: Secondary | ICD-10-CM | POA: Diagnosis present

## 2023-08-08 LAB — COMPREHENSIVE METABOLIC PANEL WITH GFR
ALT: 29 U/L (ref 0–44)
AST: 40 U/L (ref 15–41)
Albumin: 2.9 g/dL — ABNORMAL LOW (ref 3.5–5.0)
Alkaline Phosphatase: 126 U/L (ref 38–126)
Anion gap: 10 (ref 5–15)
BUN: 29 mg/dL — ABNORMAL HIGH (ref 8–23)
CO2: 17 mmol/L — ABNORMAL LOW (ref 22–32)
Calcium: 8.6 mg/dL — ABNORMAL LOW (ref 8.9–10.3)
Chloride: 109 mmol/L (ref 98–111)
Creatinine, Ser: 1.26 mg/dL — ABNORMAL HIGH (ref 0.44–1.00)
GFR, Estimated: 47 mL/min — ABNORMAL LOW (ref >60)
Glucose, Bld: 133 mg/dL — ABNORMAL HIGH (ref 70–99)
Potassium: 3.6 mmol/L (ref 3.5–5.1)
Sodium: 136 mmol/L (ref 135–145)
Total Bilirubin: 1.9 mg/dL — ABNORMAL HIGH (ref 0.0–1.2)
Total Protein: 6.3 g/dL — ABNORMAL LOW (ref 6.5–8.1)

## 2023-08-08 LAB — CBC
HCT: 37.4 % (ref 36.0–46.0)
Hemoglobin: 11.8 g/dL — ABNORMAL LOW (ref 12.0–15.0)
MCH: 30.2 pg (ref 26.0–34.0)
MCHC: 31.6 g/dL (ref 30.0–36.0)
MCV: 95.7 fL (ref 80.0–100.0)
Platelets: 137 K/uL — ABNORMAL LOW (ref 150–400)
RBC: 3.91 MIL/uL (ref 3.87–5.11)
RDW: 16.4 % — ABNORMAL HIGH (ref 11.5–15.5)
WBC: 5.3 K/uL (ref 4.0–10.5)
nRBC: 0 % (ref 0.0–0.2)

## 2023-08-08 LAB — URINALYSIS, ROUTINE W REFLEX MICROSCOPIC
Bacteria, UA: NONE SEEN
Bilirubin Urine: NEGATIVE
Glucose, UA: NEGATIVE mg/dL
Ketones, ur: NEGATIVE mg/dL
Leukocytes,Ua: NEGATIVE
Nitrite: NEGATIVE
Protein, ur: NEGATIVE mg/dL
RBC / HPF: >50 RBC/hpf (ref 0–5)
Specific Gravity, Urine: 1.006 (ref 1.005–1.030)
pH: 5 (ref 5.0–8.0)

## 2023-08-08 LAB — TYPE AND SCREEN
ABO/RH(D): AB NEG
Antibody Screen: NEGATIVE

## 2023-08-08 LAB — CBG MONITORING, ED: Glucose-Capillary: 97 mg/dL (ref 70–99)

## 2023-08-08 LAB — AMMONIA: Ammonia: 127 umol/L — ABNORMAL HIGH (ref 9–35)

## 2023-08-08 MED ORDER — LACTULOSE 10 GM/15ML PO SOLN
10.0000 g | Freq: Once | ORAL | Status: AC
  Administered 2023-08-08: 10 g via ORAL
  Filled 2023-08-08: qty 30

## 2023-08-08 MED ORDER — FAMOTIDINE 20 MG PO TABS
20.0000 mg | ORAL_TABLET | Freq: Every day | ORAL | Status: DC
  Administered 2023-08-09 – 2023-08-13 (×5): 20 mg via ORAL
  Filled 2023-08-08 (×5): qty 1

## 2023-08-08 MED ORDER — TRAZODONE HCL 50 MG PO TABS: 25.0000 mg | ORAL_TABLET | Freq: Every evening | ORAL | Status: DC | PRN

## 2023-08-08 MED ORDER — MEGESTROL ACETATE 40 MG PO TABS
40.0000 mg | ORAL_TABLET | Freq: Two times a day (BID) | ORAL | Status: DC
  Administered 2023-08-08 – 2023-08-13 (×10): 40 mg via ORAL
  Filled 2023-08-08 (×11): qty 1

## 2023-08-08 MED ORDER — RIFAXIMIN 550 MG PO TABS
550.0000 mg | ORAL_TABLET | Freq: Two times a day (BID) | ORAL | Status: DC
  Administered 2023-08-08 – 2023-08-13 (×10): 550 mg via ORAL
  Filled 2023-08-08 (×12): qty 1

## 2023-08-08 MED ORDER — LACTULOSE 10 GM/15ML PO SOLN
30.0000 g | Freq: Three times a day (TID) | ORAL | Status: DC
  Administered 2023-08-08 – 2023-08-12 (×14): 30 g via ORAL
  Filled 2023-08-08 (×15): qty 45

## 2023-08-08 MED ORDER — ALBUTEROL SULFATE (2.5 MG/3ML) 0.083% IN NEBU: 2.5000 mg | INHALATION_SOLUTION | RESPIRATORY_TRACT | Status: DC | PRN

## 2023-08-08 MED ORDER — HYDROCODONE-ACETAMINOPHEN 10-325 MG PO TABS
1.0000 | ORAL_TABLET | Freq: Three times a day (TID) | ORAL | Status: DC | PRN
  Administered 2023-08-08 – 2023-08-12 (×4): 1 via ORAL
  Filled 2023-08-08 (×4): qty 1

## 2023-08-08 MED ORDER — DICYCLOMINE HCL 10 MG PO CAPS: 10.0000 mg | ORAL_CAPSULE | Freq: Three times a day (TID) | ORAL | Status: DC | PRN

## 2023-08-08 MED ORDER — HEPARIN SODIUM (PORCINE) 5000 UNIT/ML IJ SOLN: 5000.0000 [IU] | Freq: Three times a day (TID) | INTRAMUSCULAR | Status: DC

## 2023-08-08 MED ORDER — IBUPROFEN 200 MG PO TABS
400.0000 mg | ORAL_TABLET | Freq: Four times a day (QID) | ORAL | Status: DC | PRN
Start: 2023-08-08 — End: 2023-08-09

## 2023-08-08 MED ORDER — ROPINIROLE HCL 0.25 MG PO TABS
0.2500 mg | ORAL_TABLET | Freq: Every day | ORAL | Status: DC
Start: 2023-08-08 — End: 2023-08-13
  Administered 2023-08-08 – 2023-08-12 (×5): 0.25 mg via ORAL
  Filled 2023-08-08 (×5): qty 1

## 2023-08-08 MED ORDER — VENLAFAXINE HCL ER 75 MG PO CP24
75.0000 mg | ORAL_CAPSULE | Freq: Every day | ORAL | Status: DC
  Administered 2023-08-09 – 2023-08-13 (×5): 75 mg via ORAL
  Filled 2023-08-08 (×5): qty 1

## 2023-08-08 MED ORDER — LEVOTHYROXINE SODIUM 50 MCG PO TABS
175.0000 ug | ORAL_TABLET | Freq: Every day | ORAL | Status: DC
  Administered 2023-08-09 – 2023-08-13 (×5): 175 ug via ORAL
  Filled 2023-08-08 (×5): qty 1

## 2023-08-08 NOTE — Plan of Care (Signed)

## 2023-08-08 NOTE — H&P (Signed)
 History and Physical  Qianna Clagett FMW:[id number] DOB: 11-14-1955 DOA: 08/08/2023  PCP: [name], MD   Chief Complaint: Confusion, vaginal bleeding  HPI: [name] is a 68 y.o. female with medical history significant for [name], recurrent hepatic encephalopathy, short gut syndrome, with chronic diarrhea, CKD stage IIIa, chronic anemia, hypothyroidism being admitted to the hospital with several days of worsening confusion as well as complaints of vaginal bleeding.  Regarding vaginal bleeding, she has been evaluated recently by gynecology, was prescribed Megace  and her bleeding was well-controlled.  However apparently she ran out of this medication a couple of days ago and bleeding has recurred.  She does have a long history of hepatic encephalopathy and multiple hospital admissions last in June 2025 despite taking her medications including rifaximin  and lactulose  3 times daily as prescribed.  She does have a history of short gut syndrome, unfortunately making it difficult for her to clear her ammonia.  During her last hospitalization, she was noted to have rising ammonia level as high as 154 on the day of admission and yet she was noted to have a normal mental status.  Apparently per her son, she has been increasingly confused the last few days which is consistent with when she has increased ammonia.  Workup in the emergency department as detailed below is relatively benign, does show elevated ammonia however not as high as it was during her last hospital discharge in June.  Review of Systems: Please see HPI for pertinent positives and negatives. A complete 10 system review of systems was reviewed with the patient, and otherwise negative.  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   Hyperammonemia (HCC) 09/30/2022   Hypertension    Kidney stone    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   Obesity    Pulmonary emboli (HCC) 01/10/2009   Pulmonary embolism (HCC)    Short gut syndrome    Thyroid  disease    Past Surgical History:  Procedure Laterality Date   ABDOMINAL WALL MESH  REMOVAL     2016   CESAREAN SECTION WITH BILATERAL TUBAL LIGATION  04/30/1985   CHOLECYSTECTOMY  05/2007   Exploratory laparotomy, lysis of adhesions, takedown of ileostomy with small bowel resection, open cholecystectomy and ileocolostomy   COLON SURGERY  01/2007   exploratory laparotomy with right colecotmy and end ileostomy   COLONOSCOPY  04/02/2019   Dr Gunnar Chris Daniels, MD [facility]   ESOPHAGOGASTRODUODENOSCOPY (EGD) WITH PROPOFOL  N/A 10/19/2022   Procedure: ESOPHAGOGASTRODUODENOSCOPY (EGD) WITH PROPOFOL ;  Surgeon: Saintclair Jasper, MD;  Location: [facility];  Service: Gastroenterology;  Laterality: N/A;   ESOPHAGOSCOPY  04/02/2019   EYE SURGERY Left 10/06/2020   Cataract extraciton w/intraocular lens implant- Dr Caresse   HERNIA REPAIR  10/2007   ILEOSTOMY  2009   states bowel perfotation wih colonsocopy   ILEOSTOMY CLOSURE  2009   INCISIONAL HERNIA REPAIR  10/11/2018   IR KYPHO THORACIC WITH BONE BIOPSY  08/31/2022  IR KYPHO THORACIC WITH BONE BIOPSY  10/27/2022   IR RADIOLOGIST EVAL & MGMT  09/27/2022   KIDNEY STONE SURGERY     LIVER RESECTION     2009   PANNICULECTOMY     repair of abdominal wall hernia   WRIST SURGERY     tendonitis   Social History:  reports that she quit smoking about 38 years ago. Her smoking use included cigarettes. She has  been exposed to tobacco smoke. She has never used smokeless tobacco. She reports that she does not drink alcohol and does not use drugs.  Allergies  Allergen Reactions   Ace Inhibitors Anaphylaxis and Swelling   Ivp Dye [Iodinated Contrast Media] Anaphylaxis   Desitin [Zinc  Oxide] Rash and Other (See Comments)    Worsens rash   Gadolinium Derivatives Other (See Comments)    Reaction??   Hydroxyzine  Other (See Comments)    Affects the mind   Penicillin G Dermatitis   Yellow Jacket Venom Swelling   Chlorhexidine  Rash and Other (See Comments)    WORSENS RASHES   Doxycycline Rash   Penicillins Rash   Zofran  [Ondansetron ] Rash    Family History  Problem Relation Age of Onset   Uterine cancer Mother    Bone cancer Father    Heart disease Father    Hyperlipidemia Father    Dementia Father    Hypertension Father    Skin cancer Sister    Hypertension Sister    Diabetes Sister    Hyperlipidemia Brother    Hypertension Brother    Heart disease Brother      Prior to Admission medications   Medication Sig Start Date End Date Taking? Authorizing Provider  albuterol  (VENTOLIN  HFA) 108 (90 Base) MCG/ACT inhaler Inhale 2 puffs into the lungs every 6 (six) hours as needed for shortness of breath or wheezing. 02/13/23   [provider]  atorvastatin  (LIPITOR) 40 MG tablet Take 1 tablet (40 mg total) by mouth in the morning. 02/22/23   [name], MD  copper  tablet Take 2 mg by mouth in the morning and at bedtime.    [provider]  cyanocobalamin  (VITAMIN B12) 1000 MCG tablet Take 2,000 mcg by mouth every Monday, Wednesday, and Friday.    [provider]  dicyclomine  (BENTYL ) 10 MG capsule Take 1 capsule (10 mg total) by mouth 3 (three) times daily as needed for spasms (abdominal cramping). 08/07/23   [name], MD  EPINEPHrine  0.3 mg/0.3 mL IJ SOAJ injection Inject 0.3 mg into the muscle as needed for anaphylaxis. 07/27/23   [name], MD  famotidine   (PEPCID ) 20 MG tablet Take 20 mg by mouth in the morning.    [provider]  folic acid  (FOLVITE ) 1 MG tablet Take 1 tablet (1 mg total) by mouth daily. 06/16/22   Rizwan, [name], MD  furosemide  (LASIX ) 40 MG tablet Take 1 tablet (40 mg total) by mouth 2 (two) times daily. 08/07/23   [name], MD  HYDROcodone -acetaminophen  (NORCO) 10-325 MG tablet Take 1 tablet by mouth every 8 (eight) hours as needed for moderate pain (pain score 4-6). 06/15/23   [provider]  IRON -VITAMIN C  PO Take 1 tablet by mouth daily with breakfast.    [provider]  lactulose  (CHRONULAC ) 10 GM/15ML solution Take 45 mLs (30 g total) by mouth 3 (three) times daily. Take to ensure that there is 2 loose bowel movement EVERY DAY 05/29/23   Cheryle Page, MD  levothyroxine  (SYNTHROID ) 175 MCG tablet Take  1 tablet (175 mcg total) by mouth daily at 6 (six) AM. Recheck TSH with PCP in 4 weeks 06/26/23   [name], MD  lidocaine  (LIDODERM ) 5 % Place 1 patch onto the skin daily. Remove & Discard patch within 12 hours or as directed by MD Patient taking differently: Place 1 patch onto the skin daily as needed (for pain- Remove & Discard patch within 12 hours or as directed by MD). 10/31/22   Tobie Yetta HERO, MD  magnesium  gluconate (MAGONATE) 500 (27 Mg) MG TABS tablet Take 1 tablet (500 mg total) by mouth in the morning and at bedtime. Patient taking differently: Take 1,000 mg by mouth daily. 05/01/23   [name], MD  meclizine  (ANTIVERT ) 25 MG tablet Take 1 tablet (25 mg total) by mouth 3 (three) times daily as needed for dizziness. 05/29/23   Cheryle Page, MD  megestrol  (MEGACE ) 40 MG tablet Take 1 tablet (40 mg total) by mouth 2 (two) times daily. 05/04/23   Ajewole, [name], MD  methocarbamol  (ROBAXIN ) 500 MG tablet Take 500 mg by mouth.    [provider]  metroNIDAZOLE  (FLAGYL ) 500 MG tablet Take 1 tablet (500 mg total) by mouth 2 (two) times daily. 07/12/23   Bauer, Collin S,  PA-C  midodrine  (PROAMATINE ) 10 MG tablet TAKE 1 TABLET(10 MG) BY MOUTH THREE TIMES DAILY WITH MEALS 06/26/23   [name], MD  Multiple Vitamins-Minerals (CENTRUM SILVER ULTRA WOMENS PO) Take 1 tablet by mouth in the morning.    [provider]  naloxone [facility]) nasal spray 4 mg/0.1 mL Place 1 spray into the nose as needed (accidental overdose). 03/25/22   [provider]  nystatin  cream (MYCOSTATIN ) Apply 1 Application topically 2 (two) times daily as needed for dry skin. 05/29/23   Cheryle Page, MD  pantoprazole  (PROTONIX ) 40 MG tablet Take 1 tablet (40 mg total) by mouth 2 (two) times daily before a meal. 06/09/23   [name], MD  potassium chloride  (KLOR-CON ) 10 MEQ tablet Take 2 tablets (20 mEq total) by mouth 2 (two) times daily. 08/07/23   [name], MD  rifaximin  (XIFAXAN ) 550 MG TABS tablet Take 550 mg by mouth 2 (two) times daily.    [provider]  rOPINIRole  (REQUIP ) 0.25 MG tablet TAKE 1 TABLET(0.25 MG) BY MOUTH AT BEDTIME 06/10/23   [name], MD  senna-docusate (SENOKOT-S) 8.6-50 MG tablet TAKE 1 TABLET BY MOUTH TWICE DAILY 08/02/23   [name], MD  spironolactone  (ALDACTONE ) 50 MG tablet TAKE 1 TABLET(50 MG) BY MOUTH DAILY 08/02/23   Aguiar, Rafaela, MD  TOBRADEX ophthalmic ointment Place 1 Application into the left eye daily as needed. 05/02/23   [provider]  venlafaxine  XR (EFFEXOR -XR) 75 MG 24 hr capsule Take 1 capsule (75 mg total) by mouth daily with breakfast. 07/20/23   [name], MD  Vitamin D , Ergocalciferol , (DRISDOL) 50000 UNITS CAPS Take 50,000 Units by mouth every Wednesday.    [provider]    Physical Exam: BP (!) 118/47 (BP Location: Right Arm)   Pulse 65   Temp 98.1 F (36.7 C) (Oral)   Resp 18   SpO2 100%  General: Currently the patient is awake and alert, she is oriented to self and place only.  She is sitting up on the bedside commode.  She has no acute  complaints. Cardiovascular: RRR, no murmurs or rubs, no peripheral edema  Respiratory: clear to auscultation bilaterally, no wheezes, no crackles  Abdomen: soft, nontender, nondistended, normal bowel tones heard  Skin: dry, no rashes  Musculoskeletal: no joint effusions, normal range of motion  Psychiatric: appropriate affect, normal speech  Neurologic: extraocular muscles intact, clear speech, moving all extremities with intact sensorium         Labs on Admission:  Basic Metabolic Panel: Recent Labs  Lab 08/08/23 1124  NA 136  K 3.6  CL 109  CO2 17*  GLUCOSE 133*  BUN 29*  CREATININE 1.26*  CALCIUM  8.6*   Liver Function Tests: Recent Labs  Lab 08/08/23 1124  AST 40  ALT 29  ALKPHOS 126  BILITOT 1.9*  PROT 6.3*  ALBUMIN  2.9*   No results for input(s): LIPASE, AMYLASE in the last 168 hours. Recent Labs  Lab 08/08/23 1159  AMMONIA 127*   CBC: Recent Labs  Lab 08/08/23 1124  WBC 5.3  HGB 11.8*  HCT 37.4  MCV 95.7  PLT 137*   Cardiac Enzymes: No results for input(s): CKTOTAL, CKMB, CKMBINDEX, TROPONINI in the last 168 hours. BNP (last 3 results) Recent Labs    08/30/22 0354 01/03/23 0616  BNP 614.9* 469.4*    ProBNP (last 3 results) No results for input(s): PROBNP in the last 8760 hours.  CBG: Recent Labs  Lab 08/08/23 1128  GLUCAP 97    Radiological Exams on Admission: CT Head Wo Contrast Result Date: 08/08/2023 CLINICAL DATA:  Mental status change, unknown cause EXAM: CT HEAD WITHOUT CONTRAST TECHNIQUE: Contiguous axial images were obtained from the base of the skull through the vertex without intravenous contrast. RADIATION DOSE REDUCTION: This exam was performed according to the departmental dose-optimization program which includes automated exposure control, adjustment of the mA and/or kV according to patient size and/or use of iterative reconstruction technique. COMPARISON:  June 26, 2023 FINDINGS: Brain: The ventricles appear  age appropriate. No mass effect or midline shift. Gray-white differentiation is preserved.Periventricular and subcortical white matter hypoattenuation, most consistent with changes of moderate chronic ischemic microvascular disease.No evidence of acute territorial infarction, extra-axial fluid collection, hemorrhage, or mass lesion. The basilar cisterns are patent without downward herniation. The cerebellar hemispheres and vermis are well formed without mass lesion or focal attenuation abnormality. Vascular: No hyperdense vessel. Calcified atherosclerotic plaque within the cavernous/supraclinoid ICA and intradural vertebral arteries. Skull: Normal. Negative for fracture or focal lesion. Sinuses/Orbits: The paranasal sinuses and mastoids are clear.The globes appear intact. No retrobulbar hematoma.Left lens replacement. Other: None. IMPRESSION: 1. No acute intracranial abnormality, specifically, no acute hemorrhage, territorial infarction, or intracranial mass. 2. Cerebral volume loss with sequelae of chronic ischemic microvascular disease. Electronically Signed   By: Rogelia Myers M.D.   On: 08/08/2023 15:55   DG Chest Portable 1 View Result Date: 08/08/2023 CLINICAL DATA:  ?pna.  Altered mental status. EXAM: PORTABLE CHEST 1 VIEW COMPARISON:  07/11/2023. FINDINGS: Low lung volume. Bilateral lung fields are clear. Bilateral costophrenic angles are clear. Normal cardio-mediastinal silhouette. Aortic arch calcifications noted. No acute osseous abnormalities. The soft tissues are within normal limits. IMPRESSION: No active disease. Electronically Signed   By: Ree Molt M.D.   On: 08/08/2023 12:58   Assessment/Plan Leonia Heatherly is a 68 y.o. female with medical history significant for [name], recurrent hepatic encephalopathy, short gut syndrome, with chronic diarrhea, CKD stage IIIa, chronic anemia, hypothyroidism being admitted to the hospital with several days of worsening confusion as well as  complaints of vaginal bleeding.   Hepatic encephalopathy-due to known history of [name], and short gut syndrome she unfortunately has multiple hospital admissions for hepatic encephalopathy despite best attempts  at remaining compliant with her medications.  Currently no evidence of acute infection, stroke, or other concerns. -Inpatient admission -Rifaximin  -Lactulose  3 times daily, can be uptitrated if necessary -Continue Lasix  and Aldactone  once reconciled  CKD stage IIIa-renal function appears to be at baseline  Hypothyroidism-continue Synthroid   GERD-continue famotidine   Vaginal bleeding-hemoglobin is stable, resume Megace   Depression-Effexor   DVT prophylaxis: HepSQ     Code Status: Do not attempt resuscitation (DNR) PRE-ARREST INTERVENTIONS DESIRED  Consults called: None  Admission status: The appropriate patient status for this patient is INPATIENT. Inpatient status is judged to be reasonable and necessary in order to provide the required intensity of service to ensure the patient's safety. The patient's presenting symptoms, physical exam findings, and initial radiographic and laboratory data in the context of their chronic comorbidities is felt to place them at high risk for further clinical deterioration. Furthermore, it is not anticipated that the patient will be medically stable for discharge from the hospital within 2 midnights of admission.    I certify that at the point of admission it is my clinical judgment that the patient will require inpatient hospital care spanning beyond 2 midnights from the point of admission due to high intensity of service, high risk for further deterioration and high frequency of surveillance required  Time spent: 59 minutes  Teddy Rebstock CHRISTELLA Gail MD [facility] Pager [phone]  If 7PM-7AM, please contact night-coverage [email or web address] Password [facility]  08/08/2023, 4:03 PM

## 2023-08-08 NOTE — ED Triage Notes (Signed)
 Patient presents due to confusion and vaginal bleeding. She is currently medicated for vaginal bleeding. Bleeding is intermittent and intensity varies. For example, this morning she woke up in a puddle of blood. It has since decreased. She has suffered in the past with this same complaint. Family also reports the confusion is similar to when she has increased ammonia.

## 2023-08-08 NOTE — ED Notes (Signed)
 ED TO INPATIENT HANDOFF REPORT  ED Nurse Name and Phone #: Olen RN [phone]  S Name/Age/Gender [name] 68 y.o. female Room/Bed: WA13/WA13  Code Status   Code Status: Prior  Home/SNF/Other Home Patient oriented to: self, place, time, and situation Is this baseline? Yes   Triage Complete: Triage complete  Chief Complaint vaginal bleeding  Triage Note Patient presents due to confusion and vaginal bleeding. She is currently medicated for vaginal bleeding. Bleeding is intermittent and intensity varies. For example, this morning she woke up in a puddle of blood. It has since decreased. She has suffered in the past with this same complaint. Family also reports the confusion is similar to when she has increased ammonia.    Allergies Allergies  Allergen Reactions   Ace Inhibitors Anaphylaxis and Swelling   Ivp Dye [Iodinated Contrast Media] Anaphylaxis   Desitin [Zinc  Oxide] Rash and Other (See Comments)    Worsens rash   Gadolinium Derivatives Other (See Comments)    Reaction??   Hydroxyzine  Other (See Comments)    Affects the mind   Penicillin G Dermatitis   Yellow Jacket Venom Swelling   Chlorhexidine  Rash and Other (See Comments)    WORSENS RASHES   Doxycycline Rash   Penicillins Rash   Zofran  [Ondansetron ] Rash    Level of Care/Admitting Diagnosis ED Disposition     ED Disposition  Admit   Condition  --   Comment  The patient appears reasonably stabilized for admission considering the current resources, flow, and capabilities available in the ED at this time, and I doubt any other [facility] requiring further screening and/or treatment in the ED prior to admission is  present.          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   Hyperammonemia (HCC) 09/30/2022   Hypertension    Kidney stone    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   Obesity    Pulmonary emboli (HCC) 01/10/2009   Pulmonary embolism (HCC)    Short gut syndrome    Thyroid  disease    Past Surgical History:  Procedure Laterality Date   ABDOMINAL WALL MESH  REMOVAL     2016   CESAREAN SECTION WITH BILATERAL TUBAL LIGATION  04/30/1985   CHOLECYSTECTOMY  05/2007   Exploratory laparotomy, lysis of adhesions, takedown of ileostomy with small bowel resection, open cholecystectomy and ileocolostomy   COLON SURGERY  01/2007   exploratory laparotomy with right colecotmy and end ileostomy   COLONOSCOPY  04/02/2019   Dr Gunnar Chris Daniels, MD [facility]   ESOPHAGOGASTRODUODENOSCOPY (EGD) WITH PROPOFOL  N/A 10/19/2022   Procedure: ESOPHAGOGASTRODUODENOSCOPY (EGD) WITH PROPOFOL ;  Surgeon: Saintclair Jasper, MD;  Location: [facility];  Service: Gastroenterology;  Laterality: N/A;   ESOPHAGOSCOPY  04/02/2019   EYE SURGERY Left 10/06/2020   Cataract extraciton w/intraocular lens implant- Dr Caresse   HERNIA REPAIR  10/2007   ILEOSTOMY  2009   states bowel perfotation wih  colonsocopy   ILEOSTOMY CLOSURE  2009   INCISIONAL HERNIA REPAIR  10/11/2018   IR KYPHO THORACIC WITH BONE BIOPSY  08/31/2022   IR KYPHO THORACIC WITH BONE BIOPSY  10/27/2022   IR RADIOLOGIST EVAL & MGMT  09/27/2022   KIDNEY STONE SURGERY     LIVER RESECTION     2009   PANNICULECTOMY     repair of abdominal wall hernia   WRIST SURGERY     tendonitis     A IV Location/Drains/Wounds Patient Lines/Drains/Airways Status     Active Line/Drains/Airways     Name  Placement date Placement time Site Days   Peripheral IV 08/08/23 20 G 1 Left Antecubital 08/08/23  1139  Antecubital  less than 1   Wound / Incision (Open or Dehisced) 05/02/22 (ITD) Intertriginous Dermatitis Groin Bilateral 05/02/22  0200  Groin  463   Wound / Incision (Open or Dehisced) 05/02/22 (ITD) Intertriginous Dermatitis Knee Posterior;Bilateral 05/02/22  0200  Knee  463   Wound / Incision (Open or Dehisced) 04/06/23 Irritant Dermatitis (Moisture Associated Skin Damage);Other (Comment) Buttocks Mid;Bilateral MASD 04/06/23  1515  Buttocks  124   Wound / Incision (Open or Dehisced) 02/07/23 Non-pressure wound Knee Posterior;Bilateral 02/07/23  2117  Knee  182            Intake/Output Last 24 hours No intake or output data in the 24 hours ending 08/08/23 1509  Labs/Imaging Results for orders placed or performed during the hospital encounter of 08/08/23 (from the past 48 hours)  Urinalysis, Routine w reflex microscopic -Urine, Clean Catch     Status: Abnormal   Collection Time: 08/08/23 11:24 AM  Result Value Ref Range   Color, Urine YELLOW YELLOW   APPearance CLEAR CLEAR   Specific Gravity, Urine 1.006 1.005 - 1.030   pH 5.0 5.0 - 8.0   Glucose, UA NEGATIVE NEGATIVE mg/dL   Hgb urine dipstick LARGE (A) NEGATIVE   Bilirubin Urine NEGATIVE NEGATIVE   Ketones, ur NEGATIVE NEGATIVE mg/dL   Protein, ur NEGATIVE NEGATIVE mg/dL   Nitrite NEGATIVE NEGATIVE   Leukocytes,Ua NEGATIVE NEGATIVE   RBC / HPF >50 0 - 5 RBC/hpf   WBC, UA 0-5 0 - 5 WBC/hpf   Bacteria, UA NONE SEEN NONE SEEN   Squamous Epithelial / HPF 0-5 0 - 5 /HPF   Mucus PRESENT    Hyaline Casts, UA PRESENT     Comment: Performed at [facility], [address], [place], [place] [address]  Comprehensive metabolic panel     Status: Abnormal   Collection Time: 08/08/23 11:24 AM  Result Value Ref Range   Sodium 136 135 - 145 mmol/L   Potassium 3.6 3.5 - 5.1 mmol/L   Chloride 109 98 - 111 mmol/L    CO2 17 (L) 22 - 32 mmol/L   Glucose, Bld 133 (H) 70 - 99 mg/dL    Comment: Glucose reference range applies only to samples taken after fasting for at least 8 hours.   BUN 29 (H) 8 - 23 mg/dL   Creatinine, Ser 8.73 (H) 0.44 - 1.00 mg/dL   Calcium  8.6 (L) 8.9 - 10.3 mg/dL   Total Protein 6.3 (L) 6.5 - 8.1 g/dL   Albumin  2.9 (L) 3.5 - 5.0 g/dL   AST 40 15 - 41 U/L   ALT 29 0 - 44 U/L   Alkaline Phosphatase 126 38 - 126 U/L   Total Bilirubin 1.9 (H) 0.0 - 1.2 mg/dL   GFR, Estimated 47 (L) >  60 mL/min    Comment: (NOTE) Calculated using the CKD-EPI Creatinine Equation (2021)    Anion gap 10 5 - 15    Comment: Performed at [facility], [address] [place], [place] [address]  CBC     Status: Abnormal   Collection Time: 08/08/23 11:24 AM  Result Value Ref Range   WBC 5.3 4.0 - 10.5 K/uL   RBC 3.91 3.87 - 5.11 MIL/uL   Hemoglobin 11.8 (L) 12.0 - 15.0 g/dL   HCT 62.5 63.9 - 53.9 %   MCV 95.7 80.0 - 100.0 fL   MCH 30.2 26.0 - 34.0 pg   MCHC 31.6 30.0 - 36.0 g/dL   RDW 83.5 (H) 88.4 - 84.4 %   Platelets 137 (L) 150 - 400 K/uL   nRBC 0.0 0.0 - 0.2 %    Comment: Performed at [facility], [address], [place], [place] [address]  CBG monitoring, ED     Status: None   Collection Time: 08/08/23 11:28 AM  Result Value Ref Range   Glucose-Capillary 97 70 - 99 mg/dL    Comment: Glucose reference range applies only to samples taken after fasting for at least 8 hours.  Ammonia     Status: Abnormal   Collection Time: 08/08/23 11:59 AM  Result Value Ref Range   Ammonia 127 (H) 9 - 35 umol/L    Comment: Performed at [facility], [address], [place], [place] [address]  Type and screen [facility]     Status: None   Collection Time: 08/08/23 12:28 PM  Result Value Ref Range   ABO/RH(D) AB NEG    Antibody Screen NEG    Sample Expiration      08/11/2023,2359 Performed at [facility], [address], [place], [place] [address]    DG Chest Portable 1 View Result Date: 08/08/2023 CLINICAL DATA:  ?pna.  Altered mental status. EXAM: PORTABLE CHEST 1 VIEW COMPARISON:  07/11/2023. FINDINGS: Low lung volume. Bilateral lung fields are clear. Bilateral costophrenic angles are clear. Normal cardio-mediastinal silhouette. Aortic arch calcifications noted. No acute osseous abnormalities. The soft tissues are within normal limits. IMPRESSION: No active disease. Electronically Signed   By: Ree Molt M.D.   On: 08/08/2023 12:58    Pending Labs Unresulted Labs (From admission, onward)    None       Vitals/Pain Today's Vitals   08/08/23 1053 08/08/23 1442  BP: (!) 147/63 (!) 118/47  Pulse: 74 65  Resp: 18 18  Temp: 98.6 F (37 C) 98.1 F (36.7 C)  TempSrc: Oral Oral  SpO2: 100% 100%    Isolation Precautions No active isolations  Medications Medications  lactulose  (CHRONULAC ) 10 GM/15ML solution 10 g (10 g Oral Given 08/08/23 1221)    Mobility walks with device     Focused Assessments Neuro Assessment Handoff:  Swallow screen pass? N/A         Neuro Assessment:   Neuro Checks:      Has TPA been given? No If patient is a Neuro Trauma and patient is going to OR before floor call report to 4N Charge nurse: [phone] or [phone]   R Recommendations: See Admitting Provider Note  Report given to:   Additional Notes:

## 2023-08-08 NOTE — ED Provider Notes (Signed)
 [place] [facility] AT [facility] Provider Note   CSN: [id number] Arrival date & time: 08/08/23  1042     Patient presents with: Vaginal Bleeding and Altered Mental Status   [name] is a 68 y.o. female.  With a history of nonalcoholic cirrhosis, CKD, obesity, dysfunctional uterine bleeding and hepatic encephalopathy presents to the ED for altered mental status.  Patient was recently started on Megace .  For vaginal bleeding.  This medication was effective in treating her vaginal bleeding initially however her son states she has not taken it over the last 5 days after prescription ran out.  He did pick up the prescription and give her a dose this morning.  Increased vaginal bleeding with 1 episode of large bleeding daily for last couple days.  Increased confusion at home.  No recent falls or other trauma to the head.  No fevers chills nausea vomiting or changes in bowel habits.  Son reports compliance in administering her lactulose  at home    Vaginal Bleeding Altered Mental Status      Prior to Admission medications   Medication Sig Start Date End Date Taking? Authorizing Provider  albuterol  (VENTOLIN  HFA) 108 (90 Base) MCG/ACT inhaler Inhale 2 puffs into the lungs every 6 (six) hours as needed for shortness of breath or wheezing. 02/13/23   [provider]  atorvastatin  (LIPITOR) 40 MG tablet Take 1 tablet (40 mg total) by mouth in the morning. 02/22/23   [name], MD  copper  tablet Take 2 mg by mouth in the morning and at bedtime.    [provider]  cyanocobalamin  (VITAMIN B12) 1000 MCG tablet Take 2,000 mcg by mouth every Monday, Wednesday, and Friday.    [provider]  dicyclomine  (BENTYL ) 10 MG capsule Take 1 capsule (10 mg total) by mouth 3 (three) times daily as needed for spasms (abdominal cramping). 08/07/23   [name], MD  EPINEPHrine  0.3 mg/0.3 mL IJ SOAJ injection Inject 0.3 mg into the muscle as needed for  anaphylaxis. 07/27/23   [name], MD  famotidine  (PEPCID ) 20 MG tablet Take 20 mg by mouth in the morning.    [provider]  folic acid  (FOLVITE ) 1 MG tablet Take 1 tablet (1 mg total) by mouth daily. 06/16/22   Rizwan, [name], MD  furosemide  (LASIX ) 40 MG tablet Take 1 tablet (40 mg total) by mouth 2 (two) times daily. 08/07/23   [name], MD  HYDROcodone -acetaminophen  (NORCO) 10-325 MG tablet Take 1 tablet by mouth every 8 (eight) hours as needed for moderate pain (pain score 4-6). 06/15/23   [provider]  IRON -VITAMIN C  PO Take 1 tablet by mouth daily with breakfast.    [provider]  lactulose  (CHRONULAC ) 10 GM/15ML solution Take 45 mLs (30 g total) by mouth 3 (three) times daily. Take to ensure that there is 2 loose bowel movement EVERY DAY 05/29/23   Cheryle Page, MD  levothyroxine  (SYNTHROID ) 175 MCG tablet Take 1 tablet (175 mcg total) by mouth daily at 6 (six) AM. Recheck TSH with PCP in 4 weeks 06/26/23   [name], MD  lidocaine  (LIDODERM ) 5 % Place 1 patch onto the skin daily. Remove & Discard patch within 12 hours or as directed by MD Patient taking differently: Place 1 patch onto the skin daily as needed (for pain- Remove & Discard patch within 12 hours or as directed by MD). 10/31/22   Tobie Yetta HERO, MD  magnesium  gluconate (MAGONATE) 500 (27 Mg) MG TABS  tablet Take 1 tablet (500 mg total) by mouth in the morning and at bedtime. Patient taking differently: Take 1,000 mg by mouth daily. 05/01/23   [name], MD  meclizine  (ANTIVERT ) 25 MG tablet Take 1 tablet (25 mg total) by mouth 3 (three) times daily as needed for dizziness. 05/29/23   Cheryle Page, MD  megestrol  (MEGACE ) 40 MG tablet Take 1 tablet (40 mg total) by mouth 2 (two) times daily. 05/04/23   Ajewole, [name], MD  methocarbamol  (ROBAXIN ) 500 MG tablet Take 500 mg by mouth.    [provider]  metroNIDAZOLE  (FLAGYL ) 500 MG tablet Take 1 tablet (500 mg total) by  mouth 2 (two) times daily. 07/12/23   Bauer, Collin S, PA-C  midodrine  (PROAMATINE ) 10 MG tablet TAKE 1 TABLET(10 MG) BY MOUTH THREE TIMES DAILY WITH MEALS 06/26/23   [name], MD  Multiple Vitamins-Minerals (CENTRUM SILVER ULTRA WOMENS PO) Take 1 tablet by mouth in the morning.    [provider]  naloxone [facility]) nasal spray 4 mg/0.1 mL Place 1 spray into the nose as needed (accidental overdose). 03/25/22   [provider]  nystatin  cream (MYCOSTATIN ) Apply 1 Application topically 2 (two) times daily as needed for dry skin. 05/29/23   Cheryle Page, MD  pantoprazole  (PROTONIX ) 40 MG tablet Take 1 tablet (40 mg total) by mouth 2 (two) times daily before a meal. 06/09/23   [name], MD  potassium chloride  (KLOR-CON ) 10 MEQ tablet Take 2 tablets (20 mEq total) by mouth 2 (two) times daily. 08/07/23   [name], MD  rifaximin  (XIFAXAN ) 550 MG TABS tablet Take 550 mg by mouth 2 (two) times daily.    [provider]  rOPINIRole  (REQUIP ) 0.25 MG tablet TAKE 1 TABLET(0.25 MG) BY MOUTH AT BEDTIME 06/10/23   [name], MD  senna-docusate (SENOKOT-S) 8.6-50 MG tablet TAKE 1 TABLET BY MOUTH TWICE DAILY 08/02/23   [name], MD  spironolactone  (ALDACTONE ) 50 MG tablet TAKE 1 TABLET(50 MG) BY MOUTH DAILY 08/02/23   Aguiar, Rafaela, MD  TOBRADEX ophthalmic ointment Place 1 Application into the left eye daily as needed. 05/02/23   [provider]  venlafaxine  XR (EFFEXOR -XR) 75 MG 24 hr capsule Take 1 capsule (75 mg total) by mouth daily with breakfast. 07/20/23   [name], MD  Vitamin D , Ergocalciferol , (DRISDOL) 50000 UNITS CAPS Take 50,000 Units by mouth every Wednesday.    [provider]    Allergies: Ace inhibitors, Ivp dye [iodinated contrast media], Desitin [zinc  oxide], Gadolinium derivatives, Hydroxyzine , Penicillin g, Yellow jacket venom, Chlorhexidine , Doxycycline, Penicillins, and Zofran  [ondansetron ]    Review of Systems   Genitourinary:  Positive for vaginal bleeding.    Updated Vital Signs BP (!) 118/47 (BP Location: Right Arm)   Pulse 65   Temp 98.1 F (36.7 C) (Oral)   Resp 18   SpO2 100%   Physical Exam Vitals and nursing note reviewed.  HENT:     Head: Normocephalic and atraumatic.  Eyes:     Pupils: Pupils are equal, round, and reactive to light.  Cardiovascular:     Rate and Rhythm: Normal rate and regular rhythm.  Pulmonary:     Effort: Pulmonary effort is normal.     Breath sounds: Normal breath sounds.  Abdominal:     Palpations: Abdomen is soft.     Tenderness: There is no abdominal tenderness.  Skin:    General: Skin is warm and dry.  Neurological:     Mental Status: She is  alert.     Sensory: No sensory deficit.     Motor: No weakness.     Comments: Oriented to self and time Positive asterixis.  Psychiatric:        Mood and Affect: Mood normal.     (all labs ordered are listed, but only abnormal results are displayed) Labs Reviewed  URINALYSIS, ROUTINE W REFLEX MICROSCOPIC - Abnormal; Notable for the following components:      Result Value   Hgb urine dipstick LARGE (*)    All other components within normal limits  COMPREHENSIVE METABOLIC PANEL WITH GFR - Abnormal; Notable for the following components:   CO2 17 (*)    Glucose, Bld 133 (*)    BUN 29 (*)    Creatinine, Ser 1.26 (*)    Calcium  8.6 (*)    Total Protein 6.3 (*)    Albumin  2.9 (*)    Total Bilirubin 1.9 (*)    GFR, Estimated 47 (*)    All other components within normal limits  CBC - Abnormal; Notable for the following components:   Hemoglobin 11.8 (*)    RDW 16.4 (*)    Platelets 137 (*)    All other components within normal limits  AMMONIA - Abnormal; Notable for the following components:   Ammonia 127 (*)    All other components within normal limits  CBG MONITORING, ED  TYPE AND SCREEN    EKG: None  Radiology: DG Chest Portable 1 View Result Date: 08/08/2023 CLINICAL DATA:  ?pna.   Altered mental status. EXAM: PORTABLE CHEST 1 VIEW COMPARISON:  07/11/2023. FINDINGS: Low lung volume. Bilateral lung fields are clear. Bilateral costophrenic angles are clear. Normal cardio-mediastinal silhouette. Aortic arch calcifications noted. No acute osseous abnormalities. The soft tissues are within normal limits. IMPRESSION: No active disease. Electronically Signed   By: Ree Molt M.D.   On: 08/08/2023 12:58     Procedures   Medications Ordered in the ED  lactulose  (CHRONULAC ) 10 GM/15ML solution 10 g (10 g Oral Given 08/08/23 1221)    Clinical Course as of 08/08/23 1459  Tue Aug 08, 2023  1458 Laboratory workup reveals no severe renal dysfunction.  Electrolyte imbalance, acute anemia.  No UTI.  Ammonia elevated at 127 consistent with known history of nonalcoholic liver disease.  CT head shows no acute findings based on my review but awaiting formal radiology read.  Less likely etiology at this time with patient's mental status changes could be recurrent hepatic encephalopathy.  Will admit to medicine [MP]    Clinical Course User Index [MP] Pamella Ozell LABOR, DO                                 Medical Decision Making 68 year old female with history as above presents to the ED given concern for increased vaginal bleeding and altered mental status.  Recently admitted for hepatic encephalopathy.  Megace  for vaginal bleeding has been effective in relieving vaginal bleeding but she has been without this medication last 5 days.  Her son did pick up the medication given to her this morning.  1 episode of large bleeding daily.  Compliant with lactulose .  Afebrile.  Global confusion noted but awake alert oriented without focal neurologic deficit.  Presentation most concerning for recurrence of hepatic encephalopathy.  Will obtain laboratory workup including ammonia level.  Will give her a dose of lactulose  here.  Regarding the vaginal bleeding blood pressure is hypertensive.  Hemoglobin of  11.8 not requiring transfusion.  Considering her  history of vaginal bleeding with effective treatment with Megace  no need for GYN consult at this time.  Suspect this will get better as she keeps taking her Megace .  Amount and/or Complexity of Data Reviewed Labs: ordered. Radiology: ordered.  Risk Prescription drug management. Decision regarding hospitalization.        Final diagnoses:  Hepatic encephalopathy [facility])  Vaginal bleeding    ED Discharge Orders     None          Pamella Ozell LABOR, DO 08/08/23 1500

## 2023-08-09 DIAGNOSIS — K7682 Hepatic encephalopathy: Secondary | ICD-10-CM | POA: Diagnosis not present

## 2023-08-09 LAB — CBC
HCT: 36.6 % (ref 36.0–46.0)
Hemoglobin: 11.1 g/dL — ABNORMAL LOW (ref 12.0–15.0)
MCH: 29.8 pg (ref 26.0–34.0)
MCHC: 30.3 g/dL (ref 30.0–36.0)
MCV: 98.4 fL (ref 80.0–100.0)
Platelets: 124 K/uL — ABNORMAL LOW (ref 150–400)
RBC: 3.72 MIL/uL — ABNORMAL LOW (ref 3.87–5.11)
RDW: 16.5 % — ABNORMAL HIGH (ref 11.5–15.5)
WBC: 4.5 K/uL (ref 4.0–10.5)
nRBC: 0 % (ref 0.0–0.2)

## 2023-08-09 LAB — BASIC METABOLIC PANEL WITH GFR
Anion gap: 4 — ABNORMAL LOW (ref 5–15)
BUN: 27 mg/dL — ABNORMAL HIGH (ref 8–23)
CO2: 20 mmol/L — ABNORMAL LOW (ref 22–32)
Calcium: 8.4 mg/dL — ABNORMAL LOW (ref 8.9–10.3)
Chloride: 115 mmol/L — ABNORMAL HIGH (ref 98–111)
Creatinine, Ser: 1.16 mg/dL — ABNORMAL HIGH (ref 0.44–1.00)
GFR, Estimated: 52 mL/min — ABNORMAL LOW (ref >60)
Glucose, Bld: 123 mg/dL — ABNORMAL HIGH (ref 70–99)
Potassium: 3.8 mmol/L (ref 3.5–5.1)
Sodium: 139 mmol/L (ref 135–145)

## 2023-08-09 MED ORDER — HYDROCOD POLI-CHLORPHE POLI ER 10-8 MG/5ML PO SUER: 5.0000 mL | Freq: Two times a day (BID) | ORAL | Status: DC | PRN

## 2023-08-09 MED ORDER — POTASSIUM CHLORIDE CRYS ER 10 MEQ PO TBCR
20.0000 meq | EXTENDED_RELEASE_TABLET | Freq: Two times a day (BID) | ORAL | Status: DC
  Administered 2023-08-09 – 2023-08-13 (×9): 20 meq via ORAL
  Filled 2023-08-09 (×12): qty 2

## 2023-08-09 MED ORDER — HYDROXYZINE HCL 10 MG PO TABS
10.0000 mg | ORAL_TABLET | Freq: Three times a day (TID) | ORAL | Status: DC | PRN
  Administered 2023-08-09 – 2023-08-12 (×2): 10 mg via ORAL
  Filled 2023-08-09 (×2): qty 1

## 2023-08-09 MED ORDER — ATORVASTATIN CALCIUM 40 MG PO TABS
40.0000 mg | ORAL_TABLET | Freq: Every morning | ORAL | Status: DC
  Administered 2023-08-09 – 2023-08-13 (×5): 40 mg via ORAL
  Filled 2023-08-09 (×5): qty 1

## 2023-08-09 MED ORDER — SPIRONOLACTONE 25 MG PO TABS
50.0000 mg | ORAL_TABLET | Freq: Every day | ORAL | Status: DC
  Administered 2023-08-09 – 2023-08-13 (×5): 50 mg via ORAL
  Filled 2023-08-09 (×5): qty 2

## 2023-08-09 MED ORDER — FUROSEMIDE 40 MG PO TABS
80.0000 mg | ORAL_TABLET | Freq: Every morning | ORAL | Status: DC
  Administered 2023-08-09 – 2023-08-13 (×5): 80 mg via ORAL
  Filled 2023-08-09 (×5): qty 2

## 2023-08-09 MED ORDER — PANTOPRAZOLE SODIUM 40 MG PO TBEC
40.0000 mg | DELAYED_RELEASE_TABLET | Freq: Two times a day (BID) | ORAL | Status: DC
  Administered 2023-08-09 – 2023-08-13 (×9): 40 mg via ORAL
  Filled 2023-08-09 (×9): qty 1

## 2023-08-09 MED ORDER — ACETAMINOPHEN 500 MG PO TABS: 500.0000 mg | ORAL_TABLET | Freq: Four times a day (QID) | ORAL | Status: DC | PRN

## 2023-08-09 MED ORDER — MIDODRINE HCL 5 MG PO TABS
10.0000 mg | ORAL_TABLET | Freq: Three times a day (TID) | ORAL | Status: DC
  Administered 2023-08-09 – 2023-08-13 (×12): 10 mg via ORAL
  Filled 2023-08-09 (×15): qty 2

## 2023-08-09 NOTE — Progress Notes (Signed)
 PROGRESS NOTE    [name]  FMW:[id number] DOB: Jan 17, 1955 DOA: 08/08/2023 PCP: [name], MD   Brief Narrative: [name] is a 68 y.o. female with a history of [name], hepatic encephalopathy, short gut syndrome, chronic diarrhea, CKD stage IIIa, chronic anemia, hypothyroidism.  Patient presented secondary to confusion and vaginal bleeding with evidence of likely hepatic encephalopathy.  Vaginal bleeding secondary to known fibroids.   Assessment and Plan:  Hepatic encephalopathy Recurrent history. CT head unremarkable for acute process. Patient is on lactulose  as an outpatient. Ammonia of 127 on admission. Lactulose  30 g TID and Rifaximin  started. Mental status appears to be improved, however per son, mental status is not as good as yesterday. -Continue Lactulose  and Rifaximin   NASH cirrhosis Noted. -Continue Lasix  and Spironolactone  -2 gram sodium diet  Chronic hypotension -Resume home midodrine   CKD stage IIIa Creatinine is stable.  Hypothyroidism -Continue Synthroid   Hyperlipidemia -Continue Lipitor  GERD -Continue famotidine   Vaginal bleeding Calcified uterine fibroids Noted. Patient with a prior history and is on Megace . Patient follows with Ob/Gyn as an outpatient. Patient has not been on Megace  consistently. Hemoglobin stable. -Continue Megace   Depression -Continue Effexor     DVT prophylaxis: SCDs Code Status:   Code Status: Do not attempt resuscitation (DNR) PRE-ARREST INTERVENTIONS DESIRED Family Communication: Son at bedside Disposition Plan: Discharge home likely in 1-3 days pending improvement of mentation to baseline.   Consultants:  None  Procedures:  None  Antimicrobials: None    Subjective: Patient reports no specific issues overnight. Having bowel movements. Reports some sneezing and rhinorrhea.  Objective: BP 121/65 (BP Location: Right Arm)   Pulse 64   Temp 98.6 F (37 C)   Resp 20   Ht 5' 2 (1.575 m)    Wt 88.9 kg   SpO2 (!) 83%   BMI 35.85 kg/m   Examination:  General exam: Appears calm and comfortable Respiratory system: Clear to auscultation. Respiratory effort normal. Cardiovascular system: S1 & S2 heard, RRR. 2/6 systolic murmur Gastrointestinal system: Abdomen is nondistended, soft and nontender. Normal bowel sounds heard. Central nervous system: Alert and oriented. No focal neurological deficits. Musculoskeletal: No edema. No calf tenderness Psychiatry: Judgement and insight appear normal. Mood & affect appropriate.     Data Reviewed: I have personally reviewed following labs and imaging studies  CBC Lab Results  Component Value Date   WBC 4.5 08/09/2023   RBC 3.72 (L) 08/09/2023   HGB 11.1 (L) 08/09/2023   HCT 36.6 08/09/2023   MCV 98.4 08/09/2023   MCH 29.8 08/09/2023   PLT 124 (L) 08/09/2023   MCHC 30.3 08/09/2023   RDW 16.5 (H) 08/09/2023   LYMPHSABS 1.5 07/05/2023   MONOABS 0.5 07/05/2023   EOSABS 383 07/24/2023   BASOSABS 51 07/24/2023     Last metabolic panel Lab Results  Component Value Date   NA 139 08/09/2023   K 3.8 08/09/2023   CL 115 (H) 08/09/2023   CO2 20 (L) 08/09/2023   BUN 27 (H) 08/09/2023   CREATININE 1.16 (H) 08/09/2023   GLUCOSE 123 (H) 08/09/2023   GFRNONAA 52 (L) 08/09/2023   GFRAA >60 01/19/2019   CALCIUM  8.4 (L) 08/09/2023   PHOS 3.8 04/03/2023   PROT 6.3 (L) 08/08/2023   ALBUMIN  2.9 (L) 08/08/2023   BILITOT 1.9 (H) 08/08/2023   ALKPHOS 126 08/08/2023   AST 40 08/08/2023   ALT 29 08/08/2023   ANIONGAP 4 (L) 08/09/2023    GFR: Estimated Creatinine Clearance: 50.1 mL/min (A) (by C-G  formula based on SCr of 1.16 mg/dL (H)).  No results found for this or any previous visit (from the past 240 hours).    Radiology Studies: CT Head Wo Contrast Result Date: 08/08/2023 CLINICAL DATA:  Mental status change, unknown cause EXAM: CT HEAD WITHOUT CONTRAST TECHNIQUE: Contiguous axial images were obtained from the base of the  skull through the vertex without intravenous contrast. RADIATION DOSE REDUCTION: This exam was performed according to the departmental dose-optimization program which includes automated exposure control, adjustment of the mA and/or kV according to patient size and/or use of iterative reconstruction technique. COMPARISON:  June 26, 2023 FINDINGS: Brain: The ventricles appear age appropriate. No mass effect or midline shift. Gray-white differentiation is preserved.Periventricular and subcortical white matter hypoattenuation, most consistent with changes of moderate chronic ischemic microvascular disease.No evidence of acute territorial infarction, extra-axial fluid collection, hemorrhage, or mass lesion. The basilar cisterns are patent without downward herniation. The cerebellar hemispheres and vermis are well formed without mass lesion or focal attenuation abnormality. Vascular: No hyperdense vessel. Calcified atherosclerotic plaque within the cavernous/supraclinoid ICA and intradural vertebral arteries. Skull: Normal. Negative for fracture or focal lesion. Sinuses/Orbits: The paranasal sinuses and mastoids are clear.The globes appear intact. No retrobulbar hematoma.Left lens replacement. Other: None. IMPRESSION: 1. No acute intracranial abnormality, specifically, no acute hemorrhage, territorial infarction, or intracranial mass. 2. Cerebral volume loss with sequelae of chronic ischemic microvascular disease. Electronically Signed   By: Rogelia Myers M.D.   On: 08/08/2023 15:55   DG Chest Portable 1 View Result Date: 08/08/2023 CLINICAL DATA:  ?pna.  Altered mental status. EXAM: PORTABLE CHEST 1 VIEW COMPARISON:  07/11/2023. FINDINGS: Low lung volume. Bilateral lung fields are clear. Bilateral costophrenic angles are clear. Normal cardio-mediastinal silhouette. Aortic arch calcifications noted. No acute osseous abnormalities. The soft tissues are within normal limits. IMPRESSION: No active disease.  Electronically Signed   By: Ree Molt M.D.   On: 08/08/2023 12:58      LOS: 1 day    Elgin Lam, MD [facility] 08/09/2023, 10:25 AM   If 7PM-7AM, please contact night-coverage [email or web address]

## 2023-08-09 NOTE — TOC Initial Note (Signed)
 Transition of Care [facility]) - Initial/Assessment Note    Patient Details  Name: [name] MRN: [id number] Date of Birth: 1955/05/11  Transition of Care [facility]) CM/SW Contact:    Doneta Glenys DASEN, RN Phone Number: 08/09/2023, 10:30 AM  Clinical Narrative:                 CM spoke with patient and son [name] in the room. Patient presented with acute hepatic encephalopathy. PTA from home with son. PCP/insurance verified;Pt has the following DME- walker, rollator, wheelchair, shower chair, hospital bed, hoyer denies further DME needs. No HH or SDOH needs;Pt's son to transport home at discharge.  TOC will continue to follow.  Patient Goals and CMS Choice    Expected Discharge Plan and Services   Prior Living Arrangements/Services  Activities of Daily Living   ADL Screening (condition at time of admission) Independently performs ADLs?: Yes (appropriate for developmental age) Is the patient deaf or have difficulty hearing?: Yes Does the patient have difficulty seeing, even when wearing glasses/contacts?: Yes Does the patient have difficulty concentrating, remembering, or making decisions?: Yes  Permission Sought/Granted                  Emotional Assessment              Admission diagnosis:  Hepatic encephalopathy (HCC) [K76.82] Vaginal bleeding [N93.9] Patient Active Problem List   Diagnosis Date Noted   Hepatic encephalopathy (HCC) 06/29/2023   Chronic kidney disease, stage 3a (HCC) 06/28/2023   Hyperammonemia (HCC) 05/23/2023   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) 05/23/2023   Chronic idiopathic thrombocytopenia (HCC) 05/23/2023   Chronic abdominal pain 05/23/2023   Hyperlipidemia 05/23/2023   Generalized anxiety disorder 05/23/2023   Chronic anemia 05/23/2023   Acute metabolic encephalopathy 05/23/2023   Copper  deficiency 04/24/2023   Cellulitis of right lower extremity 04/06/2023   ARF (acute renal failure) (HCC) 03/30/2023   Depression 03/30/2023   Restless  leg syndrome 03/30/2023   Pruritus 03/06/2023   Edema of both lower legs 03/06/2023   Insomnia 03/06/2023   Compression fracture of body of thoracic vertebra (HCC) 03/06/2023   Anemia of chronic disease 02/22/2023   Lumbar radiculopathy 12/10/2022   Grade I diastolic dysfunction 12/10/2022   History of DVT (deep vein thrombosis) 12/01/2022   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   Acute hepatic encephalopathy (HCC) 10/17/2022   Hypomagnesemia 10/08/2022   DNR (do not resuscitate)/DNI(Do Not intubate) 10/05/2022   Vaginal bleeding 10/05/2022   Physical deconditioning 08/17/2022   Chronic hypotension 08/11/2022   Unilateral primary osteoarthritis, left knee 08/01/2022   Folate deficiency 06/14/2022   Systolic murmur 06/12/2022   Iron  deficiency anemia 05/16/2022   Thrombocytopenia (HCC) - Baseline 100-130K 05/16/2022   History of eye surgery 05/01/2022   Corneal abnormality 04/29/2022   Status post eye surgery 04/29/2022   Corneal perforation of left eye 04/18/2022   Pseudophakia of left eye 02/08/2022   Physical debility 12/10/2021   Hepatic cirrhosis (HCC) 10/03/2021   Polypharmacy 06/22/2021   Former smoker 01/06/2021   Combined form of age-related cataract, right eye 10/01/2020   Corneal scarring 09/10/2020   Cerebral atherosclerosis 08/20/2020   Short gut syndrome 08/20/2020   DDD (degenerative disc disease), lumbar 08/20/2020   Abdominal wall hernia 03/06/2020   Splenomegaly 02/06/2019   Vitamin B12 deficiency 01/21/2019   Hyperuricemia 08/25/2016   GERD (gastroesophageal reflux disease) 07/25/2016   Hypothyroidism 07/25/2016   Microcytic anemia 04/12/2016   Vitamin D  deficiency 05/04/2015  History of adenomatous polyp of colon 04/28/2015   H/O angioedema 11/15/2014   Chronic diarrhea 06/01/2014   PCP:  [name], MD Pharmacy:   [facility] [phone] - [place], [place] - [phone] AT [facility] [address] [place]  [address] Phone: [phone] Fax: [phone]     Social Drivers of Health (SDOH) Social History: SDOH Screenings   Food Insecurity: No Food Insecurity (08/08/2023)  Housing: Low Risk  (08/08/2023)  Transportation Needs: No Transportation Needs (08/08/2023)  Utilities: Not At Risk (08/08/2023)  Depression (PHQ2-9): Medium Risk (07/24/2023)  Financial Resource Strain: Low Risk  (06/09/2023)  Social Connections: Unknown (08/08/2023)  Tobacco Use: Medium Risk (08/08/2023)   SDOH Interventions:     Readmission Risk Interventions    04/07/2023    1:23 PM 03/09/2023   11:21 AM 10/19/2022   10:12 AM  Readmission Risk Prevention Plan  Transportation Screening Complete Complete Complete  Medication Review Oceanographer) Complete Complete Complete  PCP or Specialist appointment within 3-5 days of discharge  Complete Complete  HRI or Home Care Consult Complete Complete Complete  SW Recovery Care/Counseling Consult Complete Complete Complete  Palliative Care Screening Not Applicable Not Applicable Not Applicable  Skilled Nursing Facility Not Applicable Not Applicable Not Applicable

## 2023-08-09 NOTE — Hospital Course (Addendum)
 [name] is a 68 y.o. female with a history of [name], hepatic encephalopathy, short gut syndrome, chronic diarrhea, CKD stage IIIa, chronic anemia, hypothyroidism.  Patient presented secondary to confusion and vaginal bleeding with evidence of likely hepatic encephalopathy.  Vaginal bleeding secondary to known fibroids. Hospitalization complicated by worsening mental status.

## 2023-08-09 NOTE — Plan of Care (Signed)

## 2023-08-10 ENCOUNTER — Inpatient Hospital Stay (HOSPITAL_COMMUNITY): Payer: Medicare (Managed Care)

## 2023-08-10 DIAGNOSIS — K7682 Hepatic encephalopathy: Secondary | ICD-10-CM | POA: Diagnosis not present

## 2023-08-10 LAB — COMPREHENSIVE METABOLIC PANEL WITH GFR
ALT: 39 U/L (ref 0–44)
AST: 48 U/L — ABNORMAL HIGH (ref 15–41)
Albumin: 2.6 g/dL — ABNORMAL LOW (ref 3.5–5.0)
Alkaline Phosphatase: 111 U/L (ref 38–126)
Anion gap: 11 (ref 5–15)
BUN: 25 mg/dL — ABNORMAL HIGH (ref 8–23)
CO2: 18 mmol/L — ABNORMAL LOW (ref 22–32)
Calcium: 8.4 mg/dL — ABNORMAL LOW (ref 8.9–10.3)
Chloride: 109 mmol/L (ref 98–111)
Creatinine, Ser: 0.77 mg/dL (ref 0.44–1.00)
GFR, Estimated: >60 mL/min (ref >60)
Glucose, Bld: 74 mg/dL (ref 70–99)
Potassium: 3.7 mmol/L (ref 3.5–5.1)
Sodium: 138 mmol/L (ref 135–145)
Total Bilirubin: 1.5 mg/dL — ABNORMAL HIGH (ref 0.0–1.2)
Total Protein: 5.8 g/dL — ABNORMAL LOW (ref 6.5–8.1)

## 2023-08-10 LAB — CBC
HCT: 39.2 % (ref 36.0–46.0)
Hemoglobin: 12 g/dL (ref 12.0–15.0)
MCH: 30.1 pg (ref 26.0–34.0)
MCHC: 30.6 g/dL (ref 30.0–36.0)
MCV: 98.2 fL (ref 80.0–100.0)
Platelets: 115 K/uL — ABNORMAL LOW (ref 150–400)
RBC: 3.99 MIL/uL (ref 3.87–5.11)
RDW: 16.3 % — ABNORMAL HIGH (ref 11.5–15.5)
WBC: 5.5 K/uL (ref 4.0–10.5)
nRBC: 0 % (ref 0.0–0.2)

## 2023-08-10 LAB — MAGNESIUM: Magnesium: 2 mg/dL (ref 1.7–2.4)

## 2023-08-10 NOTE — Plan of Care (Signed)
  Problem: Coping: Goal: Level of anxiety will decrease Outcome: Progressing   Problem: Activity: Goal: Risk for activity intolerance will decrease Outcome: Progressing   

## 2023-08-10 NOTE — Plan of Care (Signed)
   Problem: Education: Goal: Knowledge of General Education information will improve Description Including pain rating scale, medication(s)/side effects and non-pharmacologic comfort measures Outcome: Progressing   Problem: Health Behavior/Discharge Planning: Goal: Ability to manage health-related needs will improve Outcome: Progressing

## 2023-08-10 NOTE — Progress Notes (Signed)
 PROGRESS NOTE    [name]  FMW:[id number] DOB: 27-Jul-1955 DOA: 08/08/2023 PCP: [name], MD   Brief Narrative: [name] is a 68 y.o. female with a history of [name], hepatic encephalopathy, short gut syndrome, chronic diarrhea, CKD stage IIIa, chronic anemia, hypothyroidism.  Patient presented secondary to confusion and vaginal bleeding with evidence of likely hepatic encephalopathy.  Vaginal bleeding secondary to known fibroids.   Assessment and Plan:  Hepatic encephalopathy Recurrent history. CT head unremarkable for acute process. Patient is on lactulose  as an outpatient. Ammonia of 127 on admission. Lactulose  30 g TID and Rifaximin  started. Mental status appears to be improved, however per son, mental status is not as good as yesterday. -Continue Lactulose  and Rifaximin   NASH cirrhosis Noted. -Continue Lasix  and Spironolactone  -2 gram sodium diet  Chronic hypotension -Continue home midodrine   CKD stage IIIa Creatinine is stable.  Hypothyroidism -Continue Synthroid   Hyperlipidemia -Continue Lipitor  GERD -Continue famotidine   Abdominal pain Chronic issue -Check abdominal x-ray  Vaginal bleeding Calcified uterine fibroids Noted. Patient with a prior history and is on Megace . Patient follows with Ob/Gyn as an outpatient. Patient has not been on Megace  consistently. Hemoglobin stable. -Continue Megace   Depression -Continue Effexor     DVT prophylaxis: SCDs Code Status:   Code Status: Do not attempt resuscitation (DNR) PRE-ARREST INTERVENTIONS DESIRED Family Communication: Son on telephone Disposition Plan: Discharge home likely in 1 days pending improvement of mentation to baseline.   Consultants:  None  Procedures:  None  Antimicrobials: None    Subjective: Diarrhea this morning. Patient also reports some abdominal pain, but states she has had this pain for the past two months at least.  Objective: BP (!) 103/58 (BP  Location: Right Arm)   Pulse 89   Temp 97.6 F (36.4 C)   Resp 20   Ht 5' 2 (1.575 m)   Wt 88.9 kg   SpO2 98%   BMI 35.85 kg/m   Examination:  General exam: Appears calm and comfortable Respiratory system: Clear to auscultation. Respiratory effort normal. Cardiovascular system: S1 & S2 heard, RRR. 2/6 systolic murmur Gastrointestinal system: Abdomen is nondistended, soft and minimally tender in RUQ. Normal bowel sounds heard. Central nervous system: Alert and oriented. No focal neurological deficits. Psychiatry: Judgement and insight appear normal. Mood & affect appropriate.     Data Reviewed: I have personally reviewed following labs and imaging studies  CBC Lab Results  Component Value Date   WBC 5.5 08/10/2023   RBC 3.99 08/10/2023   HGB 12.0 08/10/2023   HCT 39.2 08/10/2023   MCV 98.2 08/10/2023   MCH 30.1 08/10/2023   PLT 115 (L) 08/10/2023   MCHC 30.6 08/10/2023   RDW 16.3 (H) 08/10/2023   LYMPHSABS 1.5 07/05/2023   MONOABS 0.5 07/05/2023   EOSABS 383 07/24/2023   BASOSABS 51 07/24/2023     Last metabolic panel Lab Results  Component Value Date   NA 138 08/10/2023   K 3.7 08/10/2023   CL 109 08/10/2023   CO2 18 (L) 08/10/2023   BUN 25 (H) 08/10/2023   CREATININE 0.77 08/10/2023   GLUCOSE 74 08/10/2023   GFRNONAA >60 08/10/2023   GFRAA >60 01/19/2019   CALCIUM  8.4 (L) 08/10/2023   PHOS 3.8 04/03/2023   PROT 5.8 (L) 08/10/2023   ALBUMIN  2.6 (L) 08/10/2023   BILITOT 1.5 (H) 08/10/2023   ALKPHOS 111 08/10/2023   AST 48 (H) 08/10/2023   ALT 39 08/10/2023   ANIONGAP 11 08/10/2023    GFR:  Estimated Creatinine Clearance: 72.6 mL/min (by C-G formula based on SCr of 0.77 mg/dL).  No results found for this or any previous visit (from the past 240 hours).    Radiology Studies: No results found.     LOS: 2 days    Elgin Lam, MD [facility] 08/10/2023, 2:43 PM   If 7PM-7AM, please contact night-coverage [email or web address]

## 2023-08-11 ENCOUNTER — Inpatient Hospital Stay (HOSPITAL_COMMUNITY): Payer: Medicare (Managed Care)

## 2023-08-11 DIAGNOSIS — K7682 Hepatic encephalopathy: Secondary | ICD-10-CM | POA: Diagnosis not present

## 2023-08-11 LAB — BASIC METABOLIC PANEL WITH GFR
Anion gap: 7 (ref 5–15)
BUN: 28 mg/dL — ABNORMAL HIGH (ref 8–23)
CO2: 20 mmol/L — ABNORMAL LOW (ref 22–32)
Calcium: 8.4 mg/dL — ABNORMAL LOW (ref 8.9–10.3)
Chloride: 112 mmol/L — ABNORMAL HIGH (ref 98–111)
Creatinine, Ser: 1.16 mg/dL — ABNORMAL HIGH (ref 0.44–1.00)
GFR, Estimated: 52 mL/min — ABNORMAL LOW (ref >60)
Glucose, Bld: 132 mg/dL — ABNORMAL HIGH (ref 70–99)
Potassium: 4.2 mmol/L (ref 3.5–5.1)
Sodium: 139 mmol/L (ref 135–145)

## 2023-08-11 NOTE — Evaluation (Signed)
 Physical Therapy Evaluation Patient Details Name: [name] MRN: [id number] DOB: 07-09-1955 Today's Date: 08/11/2023  History of Present Illness  [name] is a 68 y.o. female admitted Patient presented secondary to confusion and vaginal bleeding with evidence of likely hepatic encephalopathy.  Vaginal bleeding secondary to known fibroids.Dx with Hepatic encephalopathy. PMH includes decompensated cirrhosis, iron  deficiency anemia, anemia of chronic disease, cirrhosis, blind in L eye.  Clinical Impression  PTA, patient lives at home with son [name] and was mod I for BADL's and amb with RW or rollator in her apt within home space but required assist for community mobility and higher level IADL's. Currently, patient presents with deficits outlined below (see PT Problem List for details) most significantly pain, decreased balance and activity tolerance.  Patient requires continued Acute care hospital level PT services to progress safety and functional performance and allow for discharge. Anticipate, with progression and family assistance, no follow up PT needs likely at discharge.         If plan is discharge home, recommend the following: A little help with walking and/or transfers;A little help with bathing/dressing/bathroom;Assistance with cooking/housework;Help with stairs or ramp for entrance;Assist for transportation   Can travel by private vehicle        Equipment Recommendations None recommended by PT  Recommendations for Other Services       Functional Status Assessment Patient has had a recent decline in their functional status and demonstrates the ability to make significant improvements in function in a reasonable and predictable amount of time.     Precautions / Restrictions Precautions Precautions: None Recall of Precautions/Restrictions: Intact Precaution/Restrictions Comments: watch BP d/t dizziness Restrictions Weight Bearing Restrictions Per Provider Order: No       Mobility  Bed Mobility Overal bed mobility: Needs Assistance Bed Mobility: Sit to Supine       Sit to supine: Contact guard assist, HOB elevated, Used rails   General bed mobility comments: min cues to position self close to [facility], used rails and bed features to assist to scoot up the bed    Transfers Overall transfer level: Needs assistance Equipment used: Rolling walker (2 wheels) Transfers: Sit to/from Stand, Bed to chair/wheelchair/BSC Sit to Stand: Contact guard assist   Step pivot transfers: Contact guard assist       General transfer comment: min cues for hand placement and positioning self inside the RW during amb    Ambulation/Gait Ambulation/Gait assistance: Contact guard assist Gait Distance (Feet): 125 Feet Assistive device: Rolling walker (2 wheels) Gait Pattern/deviations: Step-through pattern, Decreased step length - right, Decreased step length - left, Shuffle, Trunk flexed       General Gait Details: min Steady assist with cues for posture and position from [employer]            Wheelchair Mobility     Tilt Bed    Modified Rankin (Stroke Patients Only)       Balance Overall balance assessment: Mild deficits observed, not formally tested                                           Pertinent Vitals/Pain Pain Assessment Pain Assessment: 0-10 Pain Score: 8  Pain Location: neck and back; reports 9-10/10 at baseline at home daily Pain Descriptors / Indicators: Aching Pain Intervention(s): Premedicated before session, Limited activity within patient's tolerance, Monitored during session    Home  Living Family/patient expects to be discharged to:: Private residence Living Arrangements: Children Available Help at Discharge: Family;Available 24 hours/day Type of Home: House Home Access: Ramped entrance       Home Layout: One level Home Equipment: Rollator (4 wheels);Rolling Walker (2 wheels);BSC/3in1;Other  (comment);Hospital bed;Wheelchair - manual;Adaptive equipment;Hand held shower head Additional Comments: Pt lives with son [name] - very supportive, provides assist as needed; [name] present bedside for evaluation and education    Prior Function Prior Level of Function : Needs assist             Mobility Comments: son typically present for all mobility; uses RW in home, manual WC outside home ADLs Comments: Son has been assisting with ADLs lately. Pt states she was doing her own ADL tasks until recently     Extremity/Trunk Assessment   Upper Extremity Assessment Upper Extremity Assessment: Defer to OT evaluation    Lower Extremity Assessment Lower Extremity Assessment: Generalized weakness    Cervical / Trunk Assessment Cervical / Trunk Assessment: Kyphotic  Communication   Communication Communication: No apparent difficulties    Cognition Arousal: Alert Behavior During Therapy: WFL for tasks assessed/performed                             Following commands: Intact       Cueing Cueing Techniques: Verbal cues     General Comments      Exercises     Assessment/Plan    PT Assessment Patient needs continued PT services  PT Problem List Decreased strength;Decreased activity tolerance;Decreased balance;Decreased mobility;Decreased knowledge of use of DME;Obesity       PT Treatment Interventions DME instruction;Gait training;Stair training;Functional mobility training;Therapeutic activities;Therapeutic exercise;Patient/family education;Balance training    PT Goals (Current goals can be found in the Care Plan section)  Acute Rehab PT Goals Patient Stated Goal: Regain PLOF PT Goal Formulation: With patient Time For Goal Achievement: 08/25/23 Potential to Achieve Goals: Good    Frequency Min 3X/week     Co-evaluation PT/OT/SLP Co-Evaluation/Treatment: Yes Reason for Co-Treatment: Complexity of the patient's impairments (multi-system involvement) PT  goals addressed during session: Mobility/safety with mobility;Balance OT goals addressed during session: ADL's and self-care;Proper use of Adaptive equipment and DME       AM-PAC PT 6 Clicks Mobility  Outcome Measure                  End of Session Equipment Utilized During Treatment: Gait belt Activity Tolerance: Patient tolerated treatment well Patient left: in bed;with call bell/phone within reach;with bed alarm set;with family/visitor present Nurse Communication: Mobility status PT Visit Diagnosis: Difficulty in walking, not elsewhere classified (R26.2);Muscle weakness (generalized) (M62.81)    Time: 8851-8786 PT Time Calculation (min) (ACUTE ONLY): 25 min   Charges:   PT Evaluation $PT Eval Low Complexity: 1 Low   PT General Charges $$ ACUTE PT VISIT: 1 Visit         [facility] PT Acute Rehabilitation Services Office [phone]   Zayn Selley 08/11/2023, 4:13 PM

## 2023-08-11 NOTE — Progress Notes (Signed)
 PROGRESS NOTE    [name]  FMW:[id number] DOB: [birth year] DOA: 08/08/2023 PCP: [name], MD   Brief Narrative: [name] is a 68 y.o. female with a history of [name], hepatic encephalopathy, short gut syndrome, chronic diarrhea, CKD stage IIIa, chronic anemia, hypothyroidism.  Patient presented secondary to confusion and vaginal bleeding with evidence of likely hepatic encephalopathy.  Vaginal bleeding secondary to known fibroids.   Assessment and Plan:  Hepatic encephalopathy Recurrent history. CT head unremarkable for acute process. Patient is on lactulose  as an outpatient. Ammonia of 127 on admission. Lactulose  30 g TID and Rifaximin  started. Mental status appears to be improved, however per son, mental status is not as good as yesterday. -Continue Lactulose  and Rifaximin   NASH cirrhosis Noted. -Continue Lasix  and Spironolactone  -2 gram sodium diet  Chronic hypotension -Continue home midodrine   CKD stage IIIa Creatinine is stable.  Hypothyroidism -Continue Synthroid   Hyperlipidemia -Continue Lipitor  GERD -Continue famotidine   Abdominal pain Chronic issue, although per patient, this is worsened. Abdominal x-ray unremarkable. -Check abdominal CT scan  Vaginal bleeding Calcified uterine fibroids Noted. Patient with a prior history and is on Megace . Patient follows with Ob/Gyn as an outpatient. Patient has not been on Megace  consistently. Hemoglobin stable. -Continue Megace   Depression -Continue Effexor     DVT prophylaxis: SCDs Code Status:   Code Status: Do not attempt resuscitation (DNR) PRE-ARREST INTERVENTIONS DESIRED Family Communication: Son on telephone Disposition Plan: Discharge home pending workup for abdominal pain   Consultants:  None  Procedures:  None  Antimicrobials: None    Subjective: Patient reports continued abdominal pain mostly in RUQ and RLQ area.  Objective: BP (!) 109/59 (BP Location: Right Arm)    Pulse 72   Temp 98.4 F (36.9 C)   Resp 18   Ht 5' 2 (1.575 m)   Wt 88.9 kg   SpO2 100%   BMI 35.85 kg/m   Examination:  General exam: Appears calm and comfortable Respiratory system: Clear to auscultation. Respiratory effort normal. Cardiovascular system: S1 & S2 heard, RRR. 2/6 systolic murmur. Gastrointestinal system: Abdomen is nondistended, soft and nontender. Normal bowel sounds heard. Central nervous system: Alert and oriented. No focal neurological deficits. Psychiatry: Judgement and insight appear normal. Mood & affect appropriate.    Data Reviewed: I have personally reviewed following labs and imaging studies  CBC Lab Results  Component Value Date   WBC 5.5 08/10/2023   RBC 3.99 08/10/2023   HGB 12.0 08/10/2023   HCT 39.2 08/10/2023   MCV 98.2 08/10/2023   MCH 30.1 08/10/2023   PLT 115 (L) 08/10/2023   MCHC 30.6 08/10/2023   RDW 16.3 (H) 08/10/2023   LYMPHSABS 1.5 07/05/2023   MONOABS 0.5 07/05/2023   EOSABS 383 07/24/2023   BASOSABS 51 07/24/2023     Last metabolic panel Lab Results  Component Value Date   NA 139 08/11/2023   K 4.2 08/11/2023   CL 112 (H) 08/11/2023   CO2 20 (L) 08/11/2023   BUN 28 (H) 08/11/2023   CREATININE 1.16 (H) 08/11/2023   GLUCOSE 132 (H) 08/11/2023   GFRNONAA 52 (L) 08/11/2023   GFRAA >60 01/19/2019   CALCIUM  8.4 (L) 08/11/2023   PHOS 3.8 04/03/2023   PROT 5.8 (L) 08/10/2023   ALBUMIN  2.6 (L) 08/10/2023   BILITOT 1.5 (H) 08/10/2023   ALKPHOS 111 08/10/2023   AST 48 (H) 08/10/2023   ALT 39 08/10/2023   ANIONGAP 7 08/11/2023    GFR: Estimated Creatinine Clearance: 50.1 mL/min (A) (by  C-G formula based on SCr of 1.16 mg/dL (H)).  No results found for this or any previous visit (from the past 240 hours).    Radiology Studies: DG Abd Portable 1V Result Date: 08/10/2023 EXAM: 1 VIEW XRAY OF THE ABDOMEN 08/10/2023 03:23:00 PM COMPARISON: None available. CLINICAL HISTORY: [id number] Abdominal pain [id number]. Abdominal  pain FINDINGS: BOWEL: Upper abdomen excluded. Stomach and small bowel nondistended. Scattered gas in the nondilated colon. SOFT TISSUES: Cholecystectomy clip. Calcified uterine fibroids. Pelvic Phleboliths. Splenic arterial calcifications. BONES: Cement   augmentation T12. IMPRESSION: 1. No acute findings. Electronically signed by: Katheleen Faes MD 08/10/2023 03:49 PM EDT RP Workstation: [id number]       LOS: 3 days    Elgin Lam, MD [facility] 08/11/2023, 1:16 PM   If 7PM-7AM, please contact night-coverage [email or web address]

## 2023-08-11 NOTE — Evaluation (Signed)
 Occupational Therapy Evaluation Patient Details Name: [name] MRN: [id number] DOB: 1955/09/18 Today's Date: 08/11/2023   History of Present Illness   [name] is a 68 y.o. female admitted Patient presented secondary to confusion and vaginal bleeding with evidence of likely hepatic encephalopathy.  Vaginal bleeding secondary to known fibroids.Dx with Hepatic encephalopathy. PMH includes decompensated cirrhosis, iron  deficiency anemia, anemia of chronic disease, cirrhosis, blind in L eye.     Clinical Impressions PTA, patient lives at home with son [name] and was mod I for BADL's and amb with RW or rollator in her apt within home space but required assist for community mobility and higher level IADL's. Currently, patient presents with deficits outlined below (see OT Problem List for details) most significantly pain, decreased balance and activity tolerance (see activity tolerance section for VSR) limiting BADL's and functional mobility.  Patient requires continued Acute care hospital level OT services to progress safety and functional performance and allow for discharge. Anticipate, with progression and family assistance, no follow up OT needs likely at discharge.       If plan is discharge home, recommend the following:   A little help with walking and/or transfers;A little help with bathing/dressing/bathroom;Assistance with cooking/housework;Assist for transportation;Help with stairs or ramp for entrance     Functional Status Assessment   Patient has had a recent decline in their functional status and demonstrates the ability to make significant improvements in function in a reasonable and predictable amount of time.     Equipment Recommendations   None recommended by OT      Precautions/Restrictions   Precautions Precautions: None Recall of Precautions/Restrictions: Intact Precaution/Restrictions Comments: watch BP d/t dizziness Restrictions Weight Bearing  Restrictions Per Provider Order: No     Mobility Bed Mobility Overal bed mobility: Needs Assistance Bed Mobility: Sit to Supine       Sit to supine: Contact guard assist, HOB elevated, Used rails   General bed mobility comments: min cues to position self close to [facility], used rails and bed features to assist to scoot up the bed    Transfers Overall transfer level: Needs assistance Equipment used: Rolling walker (2 wheels) Transfers: Sit to/from Stand, Bed to chair/wheelchair/BSC Sit to Stand: Contact guard assist     Step pivot transfers: Contact guard assist     General transfer comment: min cues for hand placement and positioning self inside the RW during amb      Balance Overall balance assessment: Mild deficits observed, not formally tested                                         ADL either performed or assessed with clinical judgement   ADL Overall ADL's : Needs assistance/impaired Eating/Feeding: Independent   Grooming: Wash/dry hands;Wash/dry face;Sitting;Set up   Upper Body Bathing: Set up;Sitting   Lower Body Bathing: Contact guard assist;Sit to/from stand   Upper Body Dressing : Set up;Sitting   Lower Body Dressing: Minimal assistance;Sit to/from stand Lower Body Dressing Details (indicate cue type and reason): some difficulty reaching R LE but has sock aide at home and son able to assist Toilet Transfer: Contact guard assist;Rolling walker (2 wheels);BSC/3in1   Toileting- Clothing Manipulation and Hygiene: Set up;Sitting/lateral lean       Functional mobility during ADLs: Contact guard assist;Rolling walker (2 wheels) General ADL Comments: decreased activity tolerance     Vision Baseline Vision/History:  (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)              Pertinent Vitals/Pain Pain Assessment Pain Assessment: 0-10 Pain Score: 8  Pain Location: neck and back; reports 9-10/10 at baseline at home daily Pain Descriptors /  Indicators: Aching Pain Intervention(s): Premedicated before session, Monitored during session, Repositioned     Extremity/Trunk Assessment Upper Extremity Assessment Upper Extremity Assessment: Right hand dominant;Overall WFL for tasks assessed   Lower Extremity Assessment Lower Extremity Assessment: Defer to PT evaluation   Cervical / Trunk Assessment Cervical / Trunk Assessment: Kyphotic   Communication Communication Communication: No apparent difficulties   Cognition Arousal: Alert Behavior During Therapy: WFL for tasks assessed/performed Cognition: No apparent impairments                               Following commands: Intact       Cueing  General Comments   Cueing Techniques: Verbal cues  no skin issues or edema noted, BP 134/65 pre after BM on commode (higher than daily trends); post amb 110/60           Home Living Family/patient expects to be discharged to:: Private residence Living Arrangements: Children Available Help at Discharge: Family;Available 24 hours/day Type of Home: House Home Access: Ramped entrance     Home Layout: One level     Bathroom Shower/Tub: Walk-in shower;Sponge bathes at baseline   Bathroom Toilet: Standard Bathroom Accessibility: Yes How Accessible: Accessible via walker Home Equipment: Rollator (4 wheels);Rolling Walker (2 wheels);BSC/3in1;Other (comment);Hospital bed;Wheelchair - manual;Adaptive equipment;Hand held Clinical biochemist: Ship broker Additional Comments: Pt lives with son [name] - very supportive, provides assist as needed; [name] present bedside for evaluation and education      Prior Functioning/Environment Prior Level of Function : Needs assist             Mobility Comments: son typically present for all mobility; uses RW in home, manual WC outside home ADLs Comments: Son has been assisting with ADLs lately. Pt states she was doing her own ADL tasks until recently    OT  Problem List: Impaired balance (sitting and/or standing);Impaired vision/perception;Cardiopulmonary status limiting activity;Obesity;Pain   OT Treatment/Interventions: Self-care/ADL training;Therapeutic exercise;Neuromuscular education;Energy conservation;DME and/or AE instruction;Therapeutic activities;Patient/family education;Balance training      OT Goals(Current goals can be found in the care plan section)   Acute Rehab OT Goals Patient Stated Goal: to get home soon OT Goal Formulation: With patient/family Time For Goal Achievement: 08/25/23 Potential to Achieve Goals: Good ADL Goals Pt Will Perform Lower Body Bathing: with modified independence;sit to/from stand Pt Will Perform Lower Body Dressing: with modified independence;sit to/from stand Pt Will Transfer to Toilet: with modified independence;ambulating;grab bars Pt/caregiver will Perform Home Exercise Program: Increased strength;Independently;With written HEP provided;Both right and left upper extremity Additional ADL Goal #1: Patient will teach back ECT's with 5 P's Handout dueign ADL's and mobility with min cues.   OT Frequency:  Min 2X/week    Co-evaluation PT/OT/SLP Co-Evaluation/Treatment: Yes Reason for Co-Treatment: Complexity of the patient's impairments (multi-system involvement) PT goals addressed during session: Mobility/safety with mobility;Balance OT goals addressed during session: ADL's and self-care;Proper use of Adaptive equipment and DME      AM-PAC OT 6 Clicks Daily Activity     Outcome Measure Help from another person eating meals?: None Help from another person taking care of personal grooming?: A Little Help from another person toileting, which includes using  toliet, bedpan, or urinal?: A Little Help from another person bathing (including washing, rinsing, drying)?: A Little Help from another person to put on and taking off regular upper body clothing?: A Little Help from another person to put on  and taking off regular lower body clothing?: A Little 6 Click Score: 19   End of Session Equipment Utilized During Treatment: Gait belt;Rolling walker (2 wheels) Nurse Communication: Mobility status;Other (comment) (BM in commode NT report she would handle and doc)  Activity Tolerance: Patient limited by fatigue Patient left: in bed;with call bell/phone within reach;with bed alarm set;with family/visitor present  OT Visit Diagnosis: Unsteadiness on feet (R26.81);Pain Pain - part of body:  (back)                Time: 8854-8794 OT Time Calculation (min): 20 min Charges:  OT General Charges $OT Visit: 1 Visit OT Evaluation $OT Eval Low Complexity: 1 Low Danetra Glock OT/L Acute Rehabilitation Department  [phone]  08/11/2023, 1:08 PM

## 2023-08-12 DIAGNOSIS — K7581 Nonalcoholic steatohepatitis (NASH): Secondary | ICD-10-CM

## 2023-08-12 DIAGNOSIS — K7460 Unspecified cirrhosis of liver: Secondary | ICD-10-CM | POA: Diagnosis not present

## 2023-08-12 DIAGNOSIS — K7682 Hepatic encephalopathy: Secondary | ICD-10-CM | POA: Diagnosis not present

## 2023-08-12 DIAGNOSIS — I9589 Other hypotension: Secondary | ICD-10-CM

## 2023-08-12 LAB — COMPREHENSIVE METABOLIC PANEL WITH GFR
ALT: 30 U/L (ref 0–44)
AST: 37 U/L (ref 15–41)
Albumin: 2.5 g/dL — ABNORMAL LOW (ref 3.5–5.0)
Alkaline Phosphatase: 107 U/L (ref 38–126)
Anion gap: 6 (ref 5–15)
BUN: 26 mg/dL — ABNORMAL HIGH (ref 8–23)
CO2: 20 mmol/L — ABNORMAL LOW (ref 22–32)
Calcium: 8.6 mg/dL — ABNORMAL LOW (ref 8.9–10.3)
Chloride: 112 mmol/L — ABNORMAL HIGH (ref 98–111)
Creatinine, Ser: 1.01 mg/dL — ABNORMAL HIGH (ref 0.44–1.00)
GFR, Estimated: >60 mL/min (ref >60)
Glucose, Bld: 100 mg/dL — ABNORMAL HIGH (ref 70–99)
Potassium: 4.3 mmol/L (ref 3.5–5.1)
Sodium: 138 mmol/L (ref 135–145)
Total Bilirubin: 2 mg/dL — ABNORMAL HIGH (ref 0.0–1.2)
Total Protein: 5.9 g/dL — ABNORMAL LOW (ref 6.5–8.1)

## 2023-08-12 LAB — BLOOD GAS, ARTERIAL
Acid-base deficit: 4.8 mmol/L — ABNORMAL HIGH (ref 0.0–2.0)
Bicarbonate: 19.5 mmol/L — ABNORMAL LOW (ref 20.0–28.0)
Drawn by: 331471
O2 Saturation: 99.6 %
Patient temperature: 36.1
pCO2 arterial: 32 mmHg (ref 32–48)
pH, Arterial: 7.39 (ref 7.35–7.45)
pO2, Arterial: 89 mmHg (ref 83–108)

## 2023-08-12 LAB — AMMONIA: Ammonia: 124 umol/L — ABNORMAL HIGH (ref 9–35)

## 2023-08-12 MED ORDER — PROCHLORPERAZINE EDISYLATE 10 MG/2ML IJ SOLN
5.0000 mg | Freq: Four times a day (QID) | INTRAMUSCULAR | Status: AC | PRN
  Administered 2023-08-12 (×2): 5 mg via INTRAVENOUS
  Filled 2023-08-12 (×2): qty 2

## 2023-08-12 NOTE — Progress Notes (Addendum)
 PROGRESS NOTE    Chistine [name]  FMW:[id number] DOB: 1955-01-19 DOA: 08/08/2023 PCP: [name], MD   Brief Narrative: [name] is a 68 y.o. female with a history of [name], hepatic encephalopathy, short gut syndrome, chronic diarrhea, CKD stage IIIa, chronic anemia, hypothyroidism.  Patient presented secondary to confusion and vaginal bleeding with evidence of likely hepatic encephalopathy.  Vaginal bleeding secondary to known fibroids. Hospitalization complicated by worsening mental status.   Assessment and Plan:  Hepatic encephalopathy Recurrent history. CT head unremarkable for acute process. Patient is on lactulose  as an outpatient. Ammonia of 127 on admission. Lactulose  30 g TID and Rifaximin  started. Mental status initially improved, now patient is much more somnolent. Obtained repeat labs. CMP stable. Ammonia still elevated at 124. No ascites on recent CT abdomen/pelvis. -Continue Lactulose  and Rifaximin  -Check ABG  NASH cirrhosis Noted. -Continue Lasix  and Spironolactone  -2 gram sodium diet  Chronic hypotension -Continue home midodrine   CKD stage IIIa Creatinine is stable.  Hypothyroidism -Continue Synthroid   Hyperlipidemia -Continue Lipitor  GERD -Continue famotidine   Abdominal pain Chronic issue, although per patient, this is worsened. Abdominal x-ray unremarkable. -Check abdominal CT scan  Vaginal bleeding Calcified uterine fibroids Noted. Patient with a prior history and is on Megace . Patient follows with Ob/Gyn as an outpatient. Patient has not been on Megace  consistently. Hemoglobin stable. -Continue Megace   Depression -Continue Effexor   Obesity, class II Estimated body mass index is 35.85 kg/m as calculated from the following:   Height as of this encounter: 5' 2 (1.575 m).   Weight as of this encounter: 88.9 kg.    DVT prophylaxis: SCDs Code Status:   Code Status: Do not attempt resuscitation (DNR) PRE-ARREST INTERVENTIONS  DESIRED Family Communication: Son on telephone, but no response Disposition Plan: Discharge home pending workup for abdominal pain   Consultants:  None  Procedures:  None  Antimicrobials: None    Subjective: Patient reports no issues. When asked if she is tired, she states yes. Speaking with nursing, patient was up all night secondary to frequent stools.  Objective: BP 128/69 (BP Location: Right Arm)   Pulse 88   Temp 98.2 F (36.8 C) (Oral)   Resp 18   Ht 5' 2 (1.575 m)   Wt 88.9 kg   SpO2 98%   BMI 35.85 kg/m   Examination:  General exam: Appears calm and comfortable Respiratory system: Clear to auscultation. Respiratory effort normal. Cardiovascular system: S1 & S2 heard, RRR. Gastrointestinal system: Abdomen is obese, soft and nontender. Normal bowel sounds heard. Central nervous system: Asleep but arouses easily, however quickly returns to sleep. Answers questions appropriately.    Data Reviewed: I have personally reviewed following labs and imaging studies  CBC Lab Results  Component Value Date   WBC 5.5 08/10/2023   RBC 3.99 08/10/2023   HGB 12.0 08/10/2023   HCT 39.2 08/10/2023   MCV 98.2 08/10/2023   MCH 30.1 08/10/2023   PLT 115 (L) 08/10/2023   MCHC 30.6 08/10/2023   RDW 16.3 (H) 08/10/2023   LYMPHSABS 1.5 07/05/2023   MONOABS 0.5 07/05/2023   EOSABS 383 07/24/2023   BASOSABS 51 07/24/2023     Last metabolic panel Lab Results  Component Value Date   NA 138 08/12/2023   K 4.3 08/12/2023   CL 112 (H) 08/12/2023   CO2 20 (L) 08/12/2023   BUN 26 (H) 08/12/2023   CREATININE 1.01 (H) 08/12/2023   GLUCOSE 100 (H) 08/12/2023   GFRNONAA >60 08/12/2023   GFRAA >60 01/19/2019  CALCIUM  8.6 (L) 08/12/2023   PHOS 3.8 04/03/2023   PROT 5.9 (L) 08/12/2023   ALBUMIN  2.5 (L) 08/12/2023   BILITOT 2.0 (H) 08/12/2023   ALKPHOS 107 08/12/2023   AST 37 08/12/2023   ALT 30 08/12/2023   ANIONGAP 6 08/12/2023    GFR: Estimated Creatinine  Clearance: 57.5 mL/min (A) (by C-G formula based on SCr of 1.01 mg/dL (H)).  No results found for this or any previous visit (from the past 240 hours).    Radiology Studies: CT ABDOMEN PELVIS WO CONTRAST Result Date: 08/11/2023 CLINICAL DATA:  Right lower quadrant and right upper quadrant abdominal pain. History of NASH cirrhosis, hepatic encephalopathy, short gut syndrome, chronic diarrhea, stage IIIA chronic kidney disease, chronic anemia and hypothyroidism. Vaginal bleeding due to known fibroids. Altered mental status. EXAM: CT ABDOMEN AND PELVIS WITHOUT CONTRAST TECHNIQUE: Multidetector CT imaging of the abdomen and pelvis was performed following the standard protocol without IV contrast. RADIATION DOSE REDUCTION: This exam was performed according to the departmental dose-optimization program which includes automated exposure control, adjustment of the mA and/or kV according to patient size and/or use of iterative reconstruction technique. COMPARISON:  Chest, abdomen and pelvis CT dated 06/28/2023 FINDINGS: Lower chest: Mildly enlarged heart. Mild linear atelectasis/scarring at both lung bases. Hepatobiliary: No focal liver abnormality is seen. Lateral segment left lobe and caudate lobe are enlarged. Status post cholecystectomy. No biliary dilatation. Pancreas: Unremarkable. No pancreatic ductal dilatation or surrounding inflammatory changes. Spleen: Normal in size without focal abnormality. Adrenals/Urinary Tract: Unremarkable adrenal glands. Stable small bilateral renal calculi. The largest is on the right, measuring 4 mm. Poorly distended urinary bladder. Unremarkable ureters. Stomach/Bowel: Unremarkable stomach, small bowel and colon. The appendix is not visualized. No secondary signs of appendicitis. Vascular/Lymphatic: Dilated inferior vena cava, iliac veins and femoral veins. Multiple dilated abdominal and retroperitoneal varices. Atheromatous arterial calcifications without aneurysm. No enlarged  lymph nodes. Reproductive: Multiple densely calcified uterine fibroids are again demonstrated. No adnexal masses. Other: Fat containing left lower pelvic paramedian ventral hernia with resolution of previously seen fluid in the hernia. Musculoskeletal: Stable lumbar and lower thoracic spine degenerative changes and T12 kyphoplasty. IMPRESSION: 1. No acute abnormality. 2. Stable small bilateral renal calculi. 3. Stable changes of cirrhosis of the liver with multiple dilated abdominal and retroperitoneal varices. 4. Stable dilated inferior vena cava, iliac veins and femoral veins. 5. Stable calcified uterine fibroids. 6. Fat containing left lower pelvic paramedian ventral hernia with resolution of previously seen fluid in the hernia. Electronically Signed   By: Elspeth Bathe M.D.   On: 08/11/2023 16:50   DG Abd Portable 1V Result Date: 08/10/2023 EXAM: 1 VIEW XRAY OF THE ABDOMEN 08/10/2023 03:23:00 PM COMPARISON: None available. CLINICAL HISTORY: [id number] Abdominal pain [id number]. Abdominal pain FINDINGS: BOWEL: Upper abdomen excluded. Stomach and small bowel nondistended. Scattered gas in the nondilated colon. SOFT TISSUES: Cholecystectomy clip. Calcified uterine fibroids. Pelvic Phleboliths. Splenic arterial calcifications. BONES: Cement   augmentation T12. IMPRESSION: 1. No acute findings. Electronically signed by: Katheleen Faes MD 08/10/2023 03:49 PM EDT RP Workstation: [id number]       LOS: 4 days    Elgin Lam, MD [facility] 08/12/2023, 2:11 PM   If 7PM-7AM, please contact night-coverage [email or web address]

## 2023-08-12 NOTE — Plan of Care (Signed)

## 2023-08-13 DIAGNOSIS — K7682 Hepatic encephalopathy: Secondary | ICD-10-CM | POA: Diagnosis not present

## 2023-08-13 NOTE — Plan of Care (Signed)

## 2023-08-13 NOTE — Progress Notes (Signed)
 Pt given dc instructions. All belongings are packed and with the patient at this time, pt is dressed and ready. Spoke with her son [name] and he stated he would be here to get here. Waiting on him to arrive at this time

## 2023-08-13 NOTE — Discharge Summary (Signed)
 Physician Discharge Summary   Patient: [name] MRN: [id number] DOB: 1955-06-15  Admit date:     08/08/2023  Discharge date: 08/13/23  Discharge Physician: Elgin Lam, MD   PCP: [name], MD   Recommendations at discharge:  PCP visit for hospital follow-up  Discharge Diagnoses: Active Problems:   Hepatic cirrhosis (HCC)   GERD (gastroesophageal reflux disease)   Hypothyroidism   Chronic hypotension   Chronic kidney disease, stage 3a (HCC)   Depression   Hyperlipidemia   Generalized anxiety disorder   Hepatic encephalopathy (HCC)  Resolved Problems:   * No resolved hospital problems. *  Hospital Course: [name] is a 68 y.o. female with a history of [name], hepatic encephalopathy, short gut syndrome, chronic diarrhea, CKD stage IIIa, chronic anemia, hypothyroidism.  Patient presented secondary to confusion and vaginal bleeding with evidence of likely hepatic encephalopathy.  Vaginal bleeding secondary to known fibroids. Hospitalization complicated by worsening mental status which was secondary to lack of sleep, rather than worsening hepatic encephalopathy. Patient returned to baseline prior to discharge.  Assessment and Plan:  Hepatic encephalopathy Recurrent history. CT head unremarkable for acute process. Patient is on lactulose  as an outpatient. Ammonia of 127 on admission. Lactulose  30 g TID and Rifaximin  started. Mental status initially improved back to baseline. Prior to discharge, patient had worsening mental status with workup negative; symptoms improved and etiology was likely related to poor sleep.   NASH cirrhosis Noted. Continue Lasix  and Spironolactone  Continue 2 gram sodium diet.   Chronic hypotension Continue home midodrine    CKD stage IIIa Creatinine is stable.   Hypothyroidism Continue Synthroid    Hyperlipidemia Continue Lipitor   GERD Continue famotidine    Abdominal pain Chronic issue, although per patient, this is  worsened. Abdominal x-ray unremarkable. CT scan unremarkable.   Vaginal bleeding Calcified uterine fibroids Noted. Patient with a prior history and is on Megace . Patient follows with Ob/Gyn as an outpatient. Patient has not been on Megace  consistently. Hemoglobin stable. Continue Megace .   Depression Anxiety Continue Effexor    Obesity, class II Estimated body mass index is 35.85 kg/m as calculated from the following:   Height as of this encounter: 5' 2 (1.575 m).   Weight as of this encounter: 88.9 kg.   Consultants: None Procedures performed: None  Disposition: Home Diet recommendation: Low sodium diet  DISCHARGE MEDICATION: Allergies as of 08/13/2023       Reactions   Ace Inhibitors Anaphylaxis, Swelling   Gadolinium Derivatives Anaphylaxis   Ivp Dye [iodinated Contrast Media] Anaphylaxis   Wound Dressing Adhesive Dermatitis   Desitin [zinc  Oxide] Rash, Other (See Comments)   Worsens rash   Yellow Jacket Venom Swelling   Chlorhexidine  Rash, Other (See Comments)   WORSENS RASHES   Doxycycline Rash   Penicillins Dermatitis, Rash   Zofran  [ondansetron ] Rash        Medication List     STOP taking these medications    metroNIDAZOLE  500 MG tablet Commonly known as: FLAGYL        TAKE these medications    albuterol  108 (90 Base) MCG/ACT inhaler Commonly known as: VENTOLIN  HFA Inhale 2 puffs into the lungs every 6 (six) hours as needed for shortness of breath or wheezing.   atorvastatin  40 MG tablet Commonly known as: LIPITOR Take 1 tablet (40 mg total) by mouth in the morning.   CENTRUM SILVER ULTRA WOMENS PO Take 1 tablet by mouth in the morning.   copper  tablet Take 2 mg by mouth in the morning and  at bedtime.   cyanocobalamin  1000 MCG tablet Commonly known as: VITAMIN B12 Take 2,000 mcg by mouth every Monday, Wednesday, and Friday.   dicyclomine  10 MG capsule Commonly known as: BENTYL  Take 1 capsule (10 mg total) by mouth 3 (three) times daily as  needed for spasms (abdominal cramping).   EPINEPHrine  0.3 mg/0.3 mL Soaj injection Commonly known as: EPI-PEN Inject 0.3 mg into the muscle as needed for anaphylaxis.   famotidine  20 MG tablet Commonly known as: PEPCID  Take 20 mg by mouth daily as needed for heartburn or indigestion.   folic acid  1 MG tablet Commonly known as: FOLVITE  Take 1 tablet (1 mg total) by mouth daily.   furosemide  40 MG tablet Commonly known as: LASIX  Take 1 tablet (40 mg total) by mouth 2 (two) times daily. What changed:  how much to take when to take this   HYDROcodone -acetaminophen  10-325 MG tablet Commonly known as: NORCO Take 1 tablet by mouth every 8 (eight) hours as needed for moderate pain (pain score 4-6).   IRON -VITAMIN C  PO Take 1 tablet by mouth daily with breakfast.   lactulose  10 GM/15ML solution Commonly known as: CHRONULAC  Take 45 mLs (30 g total) by mouth 3 (three) times daily. Take to ensure that there is 2 loose bowel movement EVERY DAY What changed:  when to take this additional instructions   levothyroxine  175 MCG tablet Commonly known as: SYNTHROID  Take 1 tablet (175 mcg total) by mouth daily at 6 (six) AM. Recheck TSH with PCP in 4 weeks   lidocaine  5 % Commonly known as: LIDODERM  Place 1 patch onto the skin daily. Remove & Discard patch within 12 hours or as directed by MD What changed:  when to take this reasons to take this additional instructions   magnesium  gluconate 500 (27 Mg) MG Tabs tablet Commonly known as: MAGONATE Take 1 tablet (500 mg total) by mouth in the morning and at bedtime. What changed:  how much to take when to take this   meclizine  25 MG tablet Commonly known as: ANTIVERT  Take 1 tablet (25 mg total) by mouth 3 (three) times daily as needed for dizziness.   megestrol  40 MG tablet Commonly known as: MEGACE  Take 1 tablet (40 mg total) by mouth 2 (two) times daily.   methocarbamol  500 MG tablet Commonly known as: ROBAXIN  Take 500 mg by  mouth in the morning.   midodrine  10 MG tablet Commonly known as: PROAMATINE  TAKE 1 TABLET(10 MG) BY MOUTH THREE TIMES DAILY WITH MEALS   naloxone 4 MG/0.1ML Liqd nasal spray kit Commonly known as: NARCAN Place 1 spray into the nose as needed (accidental overdose).   nystatin  cream Commonly known as: MYCOSTATIN  Apply 1 Application topically 2 (two) times daily as needed for dry skin.   pantoprazole  40 MG tablet Commonly known as: PROTONIX  Take 1 tablet (40 mg total) by mouth 2 (two) times daily before a meal.   potassium chloride  10 MEQ tablet Commonly known as: KLOR-CON  Take 2 tablets (20 mEq total) by mouth 2 (two) times daily.   rifaximin  550 MG Tabs tablet Commonly known as: XIFAXAN  Take 550 mg by mouth 2 (two) times daily.   rOPINIRole  0.25 MG tablet Commonly known as: REQUIP  TAKE 1 TABLET(0.25 MG) BY MOUTH AT BEDTIME What changed: See the new instructions.   senna-docusate 8.6-50 MG tablet Commonly known as: Senokot-S TAKE 1 TABLET BY MOUTH TWICE DAILY   spironolactone  50 MG tablet Commonly known as: ALDACTONE  TAKE 1 TABLET(50 MG) BY MOUTH DAILY  triamcinolone  cream 0.1 % Commonly known as: KENALOG  Apply 1 Application topically 2 (two) times daily as needed (for itching- affected areas).   venlafaxine  XR 75 MG 24 hr capsule Commonly known as: EFFEXOR -XR Take 1 capsule (75 mg total) by mouth daily with breakfast.   Vitamin D  (Ergocalciferol ) 1.25 MG (50000 UNIT) Caps capsule Commonly known as: DRISDOL Take 50,000 Units by mouth every Wednesday.        Follow-up Information     [name], MD. Schedule an appointment as soon as possible for a visit in 1 week(s).   Specialty: Family Medicine Why: For hospital follow-up Contact information: [address] [place] [address] [phone]                Discharge Exam: BP (!) 141/66 (BP Location: Right Arm)   Pulse 74   Temp 97.8 F (36.6 C) (Oral)   Resp 16   Ht 5' 2  (1.575 m)   Wt 88.9 kg   SpO2 100%   BMI 35.85 kg/m   General exam: Appears calm and comfortable Respiratory system: Clear to auscultation. Respiratory effort normal. Cardiovascular system: S1 & S2 heard, RRR. No murmurs. Gastrointestinal system: Abdomen is nondistended, soft and nontender. Normal bowel sounds heard. Central nervous system: Alert and oriented. No focal neurological deficits. Psychiatry: Judgement and insight appear normal. Mood & affect appropriate.   Condition at discharge: stable  The results of significant diagnostics from this hospitalization (including imaging, microbiology, ancillary and laboratory) are listed below for reference.   Imaging Studies: CT ABDOMEN PELVIS WO CONTRAST Result Date: 08/11/2023 CLINICAL DATA:  Right lower quadrant and right upper quadrant abdominal pain. History of NASH cirrhosis, hepatic encephalopathy, short gut syndrome, chronic diarrhea, stage IIIA chronic kidney disease, chronic anemia and hypothyroidism. Vaginal bleeding due to known fibroids. Altered mental status. EXAM: CT ABDOMEN AND PELVIS WITHOUT CONTRAST TECHNIQUE: Multidetector CT imaging of the abdomen and pelvis was performed following the standard protocol without IV contrast. RADIATION DOSE REDUCTION: This exam was performed according to the departmental dose-optimization program which includes automated exposure control, adjustment of the mA and/or kV according to patient size and/or use of iterative reconstruction technique. COMPARISON:  Chest, abdomen and pelvis CT dated 06/28/2023 FINDINGS: Lower chest: Mildly enlarged heart. Mild linear atelectasis/scarring at both lung bases. Hepatobiliary: No focal liver abnormality is seen. Lateral segment left lobe and caudate lobe are enlarged. Status post cholecystectomy. No biliary dilatation. Pancreas: Unremarkable. No pancreatic ductal dilatation or surrounding inflammatory changes. Spleen: Normal in size without focal abnormality.  Adrenals/Urinary Tract: Unremarkable adrenal glands. Stable small bilateral renal calculi. The largest is on the right, measuring 4 mm. Poorly distended urinary bladder. Unremarkable ureters. Stomach/Bowel: Unremarkable stomach, small bowel and colon. The appendix is not visualized. No secondary signs of appendicitis. Vascular/Lymphatic: Dilated inferior vena cava, iliac veins and femoral veins. Multiple dilated abdominal and retroperitoneal varices. Atheromatous arterial calcifications without aneurysm. No enlarged lymph nodes. Reproductive: Multiple densely calcified uterine fibroids are again demonstrated. No adnexal masses. Other: Fat containing left lower pelvic paramedian ventral hernia with resolution of previously seen fluid in the hernia. Musculoskeletal: Stable lumbar and lower thoracic spine degenerative changes and T12 kyphoplasty. IMPRESSION: 1. No acute abnormality. 2. Stable small bilateral renal calculi. 3. Stable changes of cirrhosis of the liver with multiple dilated abdominal and retroperitoneal varices. 4. Stable dilated inferior vena cava, iliac veins and femoral veins. 5. Stable calcified uterine fibroids. 6. Fat containing left lower pelvic paramedian ventral hernia with resolution of  previously seen fluid in the hernia. Electronically Signed   By: Elspeth Bathe M.D.   On: 08/11/2023 16:50   DG Abd Portable 1V Result Date: 08/10/2023 EXAM: 1 VIEW XRAY OF THE ABDOMEN 08/10/2023 03:23:00 PM COMPARISON: None available. CLINICAL HISTORY: [id number] Abdominal pain [id number]. Abdominal pain FINDINGS: BOWEL: Upper abdomen excluded. Stomach and small bowel nondistended. Scattered gas in the nondilated colon. SOFT TISSUES: Cholecystectomy clip. Calcified uterine fibroids. Pelvic Phleboliths. Splenic arterial calcifications. BONES: Cement   augmentation T12. IMPRESSION: 1. No acute findings. Electronically signed by: Katheleen Faes MD 08/10/2023 03:49 PM EDT RP Workstation: [id number]   CT Head Wo  Contrast Result Date: 08/08/2023 CLINICAL DATA:  Mental status change, unknown cause EXAM: CT HEAD WITHOUT CONTRAST TECHNIQUE: Contiguous axial images were obtained from the base of the skull through the vertex without intravenous contrast. RADIATION DOSE REDUCTION: This exam was performed according to the departmental dose-optimization program which includes automated exposure control, adjustment of the mA and/or kV according to patient size and/or use of iterative reconstruction technique. COMPARISON:  June 26, 2023 FINDINGS: Brain: The ventricles appear age appropriate. No mass effect or midline shift. Gray-white differentiation is preserved.Periventricular and subcortical white matter hypoattenuation, most consistent with changes of moderate chronic ischemic microvascular disease.No evidence of acute territorial infarction, extra-axial fluid collection, hemorrhage, or mass lesion. The basilar cisterns are patent without downward herniation. The cerebellar hemispheres and vermis are well formed without mass lesion or focal attenuation abnormality. Vascular: No hyperdense vessel. Calcified atherosclerotic plaque within the cavernous/supraclinoid ICA and intradural vertebral arteries. Skull: Normal. Negative for fracture or focal lesion. Sinuses/Orbits: The paranasal sinuses and mastoids are clear.The globes appear intact. No retrobulbar hematoma.Left lens replacement. Other: None. IMPRESSION: 1. No acute intracranial abnormality, specifically, no acute hemorrhage, territorial infarction, or intracranial mass. 2. Cerebral volume loss with sequelae of chronic ischemic microvascular disease. Electronically Signed   By: Rogelia Myers M.D.   On: 08/08/2023 15:55   DG Chest Portable 1 View Result Date: 08/08/2023 CLINICAL DATA:  ?pna.  Altered mental status. EXAM: PORTABLE CHEST 1 VIEW COMPARISON:  07/11/2023. FINDINGS: Low lung volume. Bilateral lung fields are clear. Bilateral costophrenic angles are clear.  Normal cardio-mediastinal silhouette. Aortic arch calcifications noted. No acute osseous abnormalities. The soft tissues are within normal limits. IMPRESSION: No active disease. Electronically Signed   By: Ree Molt M.D.   On: 08/08/2023 12:58    Microbiology: Results for orders placed or performed during the hospital encounter of 07/11/23  Wet prep, genital     Status: Abnormal   Collection Time: 07/12/23 12:39 AM   Specimen: Vaginal  Result Value Ref Range Status   Yeast Wet Prep HPF POC PRESENT (A) NONE SEEN Final   Trich, Wet Prep NONE SEEN NONE SEEN Final   Clue Cells Wet Prep HPF POC PRESENT (A) NONE SEEN Final   WBC, Wet Prep HPF POC <10 <10 Final   Sperm NONE SEEN  Final    Comment: Performed at [facility], [address], [place], [place] [address]    Labs: CBC: Recent Labs  Lab 08/08/23 1124 08/09/23 0455 08/10/23 0448  WBC 5.3 4.5 5.5  HGB 11.8* 11.1* 12.0  HCT 37.4 36.6 39.2  MCV 95.7 98.4 98.2  PLT 137* 124* 115*   Basic Metabolic Panel: Recent Labs  Lab 08/08/23 1124 08/09/23 0455 08/10/23 0448 08/11/23 1045 08/12/23 1144  NA 136 139 138 139 138  K 3.6 3.8 3.7 4.2 4.3  CL 109 115* 109 112* 112*  CO2 17* 20* 18* 20* 20*  GLUCOSE 133* 123* 74 132* 100*  BUN 29* 27* 25* 28* 26*  CREATININE 1.26* 1.16* 0.77 1.16* 1.01*  CALCIUM  8.6* 8.4* 8.4* 8.4* 8.6*  MG  --   --  2.0  --   --    Liver Function Tests: Recent Labs  Lab 08/08/23 1124 08/10/23 0448 08/12/23 1144  AST 40 48* 37  ALT 29 39 30  ALKPHOS 126 111 107  BILITOT 1.9* 1.5* 2.0*  PROT 6.3* 5.8* 5.9*  ALBUMIN  2.9* 2.6* 2.5*   CBG: Recent Labs  Lab 08/08/23 1128  GLUCAP 97    Discharge time spent: 35 minutes.  Signed: Elgin Lam, MD [facility] 08/13/2023

## 2023-08-13 NOTE — Discharge Instructions (Addendum)
 [name],  You are in the hospital because of confusion from your hepatic encephalopathy.  This was managed with lactulose  treatment and he has improved towards her baseline.  Please continue lactulose  as prescribed.  Please follow-up with your primary care physician.

## 2023-08-14 ENCOUNTER — Telehealth: Payer: Self-pay

## 2023-08-14 NOTE — Transitions of Care (Post Inpatient/ED Visit) (Signed)
   08/14/2023  Name: [name] MRN: [id number] DOB: [birth year]  Today's TOC FU Call Status: Today's TOC FU Call Status:: Unsuccessful Call (1st Attempt) Unsuccessful Call (1st Attempt) Date: 08/14/23  Attempted to reach the patient regarding the most recent Inpatient/ED visit.  Follow Up Plan: Additional outreach attempts will be made to reach the patient to complete the Transitions of Care (Post Inpatient/ED visit) call.   Alan Ee, RN, BSN, CEN [employer]- Transition of Care Team.  [facility] [phone]

## 2023-08-17 ENCOUNTER — Telehealth: Payer: Self-pay

## 2023-08-17 ENCOUNTER — Telehealth: Payer: Self-pay | Admitting: *Deleted

## 2023-08-17 ENCOUNTER — Other Ambulatory Visit: Payer: Self-pay | Admitting: Family Medicine

## 2023-08-17 DIAGNOSIS — K7469 Other cirrhosis of liver: Secondary | ICD-10-CM

## 2023-08-17 DIAGNOSIS — D509 Iron deficiency anemia, unspecified: Secondary | ICD-10-CM | POA: Diagnosis not present

## 2023-08-17 DIAGNOSIS — E790 Hyperuricemia without signs of inflammatory arthritis and tophaceous disease: Secondary | ICD-10-CM

## 2023-08-17 DIAGNOSIS — I672 Cerebral atherosclerosis: Secondary | ICD-10-CM

## 2023-08-17 DIAGNOSIS — R600 Localized edema: Secondary | ICD-10-CM

## 2023-08-17 NOTE — Transitions of Care (Post Inpatient/ED Visit) (Signed)
   08/17/2023  Name: Opel Lejeune MRN: [id number] DOB: Dec 24, 1955  Today's TOC FU Call Status: Today's TOC FU Call Status:: Unsuccessful Call (2nd Attempt) Unsuccessful Call (2nd Attempt) Date: 08/17/23  Attempted to reach the patient regarding the most recent Inpatient/ED visit. Unidentified female answered the phone, states pt is asleep, requests call back tomorrow.  Follow Up Plan: Additional outreach attempts will be made to reach the patient to complete the Transitions of Care (Post Inpatient/ED visit) call.   Mliss Creed [facility], BSN RN Care Manager/ Transition of Care [place] [phone]

## 2023-08-18 ENCOUNTER — Other Ambulatory Visit (HOSPITAL_COMMUNITY): Payer: Self-pay

## 2023-08-18 NOTE — Transitions of Care (Post Inpatient/ED Visit) (Signed)
 08/18/2023  Name: [name] MRN: [id number] DOB: 09-Feb-1955  Today's TOC FU Call Status: Today's TOC FU Call Status:: Successful TOC FU Call Completed TOC FU Call Complete Date: 08/17/23 Patient's Name and Date of Birth confirmed.  Transition Care Management Follow-up Telephone Call How have you been since you were released from the [name]?: Better Any questions or concerns?: No  Items Reviewed: Did you receive and understand the discharge instructions provided?: No Medications obtained,verified, and reconciled?: Yes (Medications Reviewed) Any new allergies since your discharge?: No Dietary orders reviewed?: Yes Type of Diet Ordered:: low salt diet Do you have support at home?: Yes People in Home [RPT]: child(ren), adult Name of Support/Comfort Primary Source: [name]  Medications Reviewed Today: Medications Reviewed Today     Reviewed by Kara Kara PENNER, RN (Registered Nurse) on 08/17/23 at 1634  Med List Status: <None>   Medication Order Taking? Sig Documenting Provider Last Dose Status Informant  albuterol  (VENTOLIN  HFA) 108 (90 Base) MCG/ACT inhaler [id number]  Inhale 2 puffs into the lungs every 6 (six) hours as needed for shortness of breath or wheezing.  Patient not taking: Reported on 08/17/2023   Provider, Historical, [name]  Active Family Member  atorvastatin  (LIPITOR) 40 MG tablet [id number] Yes Take 1 tablet (40 mg total) by mouth in the morning. [name], [name]  Active Family Member  copper  tablet [id number] Yes Take 2 mg by mouth in the morning and at bedtime. Provider, Historical, [name]  Active Family Member  cyanocobalamin  (VITAMIN B12) 1000 MCG tablet [id number] Yes Take 2,000 mcg by mouth every Monday, Wednesday, and Friday. Provider, Historical, [name]  Active Family Member  dicyclomine  (BENTYL ) 10 MG capsule [id number] Yes Take 1 capsule (10 mg total) by mouth 3 (three) times daily as needed for spasms (abdominal cramping). [name], [name]  Active Family Member   EPINEPHrine  0.3 mg/0.3 mL IJ SOAJ injection [id number]  Inject 0.3 mg into the muscle as needed for anaphylaxis.  Patient not taking: Reported on 08/17/2023   [name], [name]  Active Family Member  famotidine  (PEPCID ) 20 MG tablet [id number] Yes Take 20 mg by mouth daily as needed for heartburn or indigestion. Provider, Historical, [name]  Active Family Member  folic acid  (FOLVITE ) 1 MG tablet [id number] Yes Take 1 tablet (1 mg total) by mouth daily. [name], [name], [name]  Active Family Member  furosemide  (LASIX ) 40 MG tablet [id number] Yes Take 1 tablet (40 mg total) by mouth 2 (two) times daily. [name], [name]  Active Family Member  HYDROcodone -acetaminophen  [name]) 10-325 MG tablet [id number] Yes Take 1 tablet by mouth every 8 (eight) hours as needed for moderate pain (pain score 4-6). Provider, Historical, [name]  Active Family Member  IRON -VITAMIN C  PO [id number] Yes Take 1 tablet by mouth daily with breakfast. Provider, Historical, [name]  Active Family Member  lactulose  (CHRONULAC ) 10 GM/15ML solution [id number] Yes Take 45 mLs (30 g total) by mouth 3 (three) times daily. Take to ensure that there is 2 loose bowel movement EVERY DAY  Patient taking differently: Take 30 g by mouth 2 (two) times daily.   [name], [name]  Active Family Member  levothyroxine  (SYNTHROID ) 175 MCG tablet [id number] Yes Take 1 tablet (175 mcg total) by mouth daily at 6 (six) AM. Recheck TSH with PCP in 4 weeks [name], [name]  Active Family Member  lidocaine  (LIDODERM ) 5 % [id number] Yes Place 1 patch onto the skin daily. Remove & Discard patch within 12 hours or as directed by [name] [name],  [name]  Active Family Member           Med Note [name]   Sat Dec 10, 2022 11:45 AM)    magnesium  gluconate (MAGONATE) 500 (27 Mg) MG TABS tablet [id number] Yes Take 1 tablet (500 mg total) by mouth in the morning and at bedtime. [name], [name]  Active Family Member  meclizine  (ANTIVERT ) 25 MG tablet [id number] Yes Take  1 tablet (25 mg total) by mouth 3 (three) times daily as needed for dizziness. [name], [name]  Active Family Member  megestrol  (MEGACE ) 40 MG tablet [id number] Yes Take 1 tablet (40 mg total) by mouth 2 (two) times daily. [name], [name], [name]  Active Family Member  methocarbamol  (ROBAXIN ) 500 MG tablet [id number] Yes Take 500 mg by mouth in the morning. Provider, Historical, [name]  Active Family Member  midodrine  (PROAMATINE ) 10 MG tablet [id number] Yes TAKE 1 TABLET(10 MG) BY MOUTH THREE TIMES DAILY WITH MEALS [name], [name]  Active Family Member  Multiple Vitamins-Minerals (CENTRUM SILVER ULTRA WOMENS PO) [id number] Yes Take 1 tablet by mouth in the morning. Provider, Historical, [name]  Active Family Member  naloxone [facility]) nasal spray 4 mg/0.1 mL [id number]  Place 1 spray into the nose as needed (accidental overdose).  Patient not taking: Reported on 08/17/2023   Provider, Historical, [name]  Active Family Member           Med Note [name]   Wed Mar 08, 2023  2:39 PM)    nystatin  cream (MYCOSTATIN ) [id number] Yes Apply 1 Application topically 2 (two) times daily as needed for dry skin. [name], [name]  Active Family Member  pantoprazole  (PROTONIX ) 40 MG tablet [id number] Yes Take 1 tablet (40 mg total) by mouth 2 (two) times daily before [name] meal. [name], [name]  Active Family Member  potassium chloride  (KLOR-CON ) 10 MEQ tablet [id number] Yes Take 2 tablets (20 mEq total) by mouth 2 (two) times daily. [name], [name]  Active Family Member  rifaximin  (XIFAXAN ) 550 MG TABS tablet [id number] Yes Take 550 mg by mouth 2 (two) times daily. Provider, Historical, [name]  Active Family Member  rOPINIRole  (REQUIP ) 0.25 MG tablet [id number] Yes TAKE 1 TABLET(0.25 MG) BY MOUTH AT BEDTIME [name], [name]  Active Family Member  senna-docusate (SENOKOT-S) 8.6-50 MG tablet [id number] Yes TAKE 1 TABLET BY MOUTH TWICE DAILY [name], [name]  Active Family Member  spironolactone  (ALDACTONE ) 50  MG tablet [id number] Yes TAKE 1 TABLET(50 MG) BY MOUTH DAILY [name], [name]  Active Family Member  triamcinolone  cream (KENALOG ) 0.1 % [id number] Yes Apply 1 Application topically 2 (two) times daily as needed (for itching- affected areas). Provider, Historical, [name]  Active Family Member  venlafaxine  XR (EFFEXOR -XR) 75 MG 24 hr capsule [id number] Yes Take 1 capsule (75 mg total) by mouth daily with breakfast. [name], [name]  Active Family Member  Vitamin D , Ergocalciferol , (DRISDOL) 50000 UNITS CAPS [id number] Yes Take 50,000 Units by mouth every Wednesday. Provider, Historical, [name]  Active Family Member  Med List Note [name] [name] 03/08/23 1450): Pt's son [name] helps with medications            Home Care and Equipment/Supplies: Were Home Health Services Ordered?: No Any new equipment or medical supplies ordered?: No  Functional Questionnaire: Do you need assistance with bathing/showering or dressing?: No Do you need assistance with meal preparation?: Yes (family) Do you need assistance with eating?: No Do you have difficulty maintaining continence: No  Do you need assistance with getting out of bed/getting out of [name] chair/moving?: No Do you have difficulty managing or taking your medications?: No  Follow up appointments reviewed: PCP Follow-up appointment confirmed?: No [name] Provider Line Number:[phone] Given: No Specialist [name] Follow-up appointment confirmed?: Yes Date of Specialist follow-up appointment?: 09/04/23 Follow-Up Specialty Provider:: GYN Do you need transportation to your follow-up appointment?: No Do you understand care options if your condition(s) worsen?: Yes-patient verbalized understanding  SDOH Interventions Today    Flowsheet Row Most Recent Value  SDOH Interventions   Food Insecurity Interventions Intervention Not Indicated  Housing Interventions Intervention Not Indicated  Transportation Interventions Intervention Not  Indicated  Utilities Interventions Intervention Not Indicated   Phone call with patient who requested I speak with her son [name].  All information gather during this visit was from [name].  Reviewed and offered 30 day TOC program and son, [name] has consented. Referral placed for [facility] SW   Goals Addressed             This Visit's Progress    [facility] Transitions of Care (TOC) Care Plan       Problems:  Recent Hospitalization for treatment of hepatic encephalopathy- dehydration with frequent BM's Son, [name] is caregiver and needs help with Medicaid application No [name] follow up with PCP  Goal:  Over the next 30 days, the patient will not experience [name] readmission  Interventions:  Transitions of Care: Doctor Visits  - discussed the importance of doctor visits Reviewed Signs and symptoms of infection Reviewed importance of timely follow up with PCP and son will call and make an appointment.  Cirrhosis Evaluation of current treatment plan related to cirrhosis, Financial constraints related to son reports need for medicaid for cost of medical expense and son reports patient need [name] power scooter or wheelchair and out of pocket cost of 3,000 self-management and patient's adherence to plan as established by provider. Discussed plans with patient for ongoing care management follow up and provided patient with direct contact information for care management team Evaluation of current treatment plan related to cirrhosis  and patient's adherence to plan as established by provider Reviewed medications with patient and discussed importance of taking all medications as prescribed. Long discussion about lactulose  and importance of bowel movements to keep ammonia down. Collaborated with son regarding early recongition of increased ammonia levels.  Reviewed scheduled/upcoming provider appointments including needing PCP follow up Social Work referral for financial concerns: help with medicaid Advised  patient/ son to discuss frequent readmission with provider Assessed social determinant of health barriers Encouraged son to assist to follow low salt diet since he provides meals Reviewed importance of daily weight and keeping [name] logs of weights.  Reviewed importance of protein in diet.  Reviewed bleeding risk due to cirrhosis. Encouraged son to observe for any signs of infection and get medical treatment for infections asap.  Reviewed and offered 30 day TOC program and son has consented.  Provided son with assigned Case manager name and phone number. Next appointment scheduled In basket message sent to [name] to inquire about being active with patient- instructed to place [name] new referral.   Patient Self Care Activities:  Attend all scheduled provider appointments Attend church or other social activities Call pharmacy for medication refills 3-7 days in advance of running out of medications Call provider office for new concerns or questions  Notify RN Care Manager of TOC call rescheduling needs Participate in Transition of Care Program/Attend TOC scheduled calls Perform all  self care activities independently  Take medications as prescribed   Work with the social worker to address care coordination needs and will continue to work with the clinical team to address health care and disease management related needs Do not skip lactulose  Increase protein in diet to help with cirrhosis Get emergency for bleeding- rectal blood, dark black stools or vomiting of blood Make an appointment with PCP  Plan:  Telephone follow up appointment with care management team member scheduled for:  Kara Creed RN on 08/24/2023 at 315 pm.  Provided contact number for assign RN CM       Kara Ee, RN, BSN, CEN Population Health- Transition of Care Team.  [facility] [phone]

## 2023-08-22 ENCOUNTER — Inpatient Hospital Stay: Payer: Medicare (Managed Care) | Admitting: Family Medicine

## 2023-08-24 ENCOUNTER — Other Ambulatory Visit: Payer: Medicare (Managed Care) | Admitting: *Deleted

## 2023-08-24 ENCOUNTER — Inpatient Hospital Stay (HOSPITAL_COMMUNITY)
Admission: EM | Admit: 2023-08-24 | Discharge: 2023-08-29 | DRG: 442 | Disposition: A | Discharge status: Home / Self Care | Payer: Medicare (Managed Care) | Attending: Family Medicine | Admitting: Family Medicine

## 2023-08-24 ENCOUNTER — Inpatient Hospital Stay (HOSPITAL_COMMUNITY): Payer: Medicare (Managed Care)

## 2023-08-24 ENCOUNTER — Encounter (HOSPITAL_COMMUNITY): Payer: Self-pay | Admitting: Emergency Medicine

## 2023-08-24 ENCOUNTER — Emergency Department (HOSPITAL_COMMUNITY): Payer: Medicare (Managed Care)

## 2023-08-24 ENCOUNTER — Other Ambulatory Visit: Payer: Self-pay

## 2023-08-24 DIAGNOSIS — Z87891 Personal history of nicotine dependence: Secondary | ICD-10-CM | POA: Diagnosis not present

## 2023-08-24 DIAGNOSIS — E785 Hyperlipidemia, unspecified: Secondary | ICD-10-CM | POA: Diagnosis present

## 2023-08-24 DIAGNOSIS — Z8719 Personal history of other diseases of the digestive system: Secondary | ICD-10-CM

## 2023-08-24 DIAGNOSIS — Z66 Do not resuscitate: Secondary | ICD-10-CM | POA: Diagnosis present

## 2023-08-24 DIAGNOSIS — D631 Anemia in chronic kidney disease: Secondary | ICD-10-CM | POA: Diagnosis present

## 2023-08-24 DIAGNOSIS — Z91041 Radiographic dye allergy status: Secondary | ICD-10-CM

## 2023-08-24 DIAGNOSIS — Z1152 Encounter for screening for COVID-19: Secondary | ICD-10-CM

## 2023-08-24 DIAGNOSIS — R059 Cough, unspecified: Secondary | ICD-10-CM

## 2023-08-24 DIAGNOSIS — K766 Portal hypertension: Secondary | ICD-10-CM | POA: Diagnosis present

## 2023-08-24 DIAGNOSIS — Z6838 Body mass index (BMI) 38.0-38.9, adult: Secondary | ICD-10-CM | POA: Diagnosis not present

## 2023-08-24 DIAGNOSIS — Z883 Allergy status to other anti-infective agents status: Secondary | ICD-10-CM

## 2023-08-24 DIAGNOSIS — E66812 Obesity, class 2: Secondary | ICD-10-CM | POA: Diagnosis not present

## 2023-08-24 DIAGNOSIS — R410 Disorientation, unspecified: Secondary | ICD-10-CM | POA: Diagnosis not present

## 2023-08-24 DIAGNOSIS — Z8249 Family history of ischemic heart disease and other diseases of the circulatory system: Secondary | ICD-10-CM

## 2023-08-24 DIAGNOSIS — I129 Hypertensive chronic kidney disease with stage 1 through stage 4 chronic kidney disease, or unspecified chronic kidney disease: Secondary | ICD-10-CM | POA: Diagnosis present

## 2023-08-24 DIAGNOSIS — R1084 Generalized abdominal pain: Secondary | ICD-10-CM | POA: Diagnosis not present

## 2023-08-24 DIAGNOSIS — Z808 Family history of malignant neoplasm of other organs or systems: Secondary | ICD-10-CM

## 2023-08-24 DIAGNOSIS — D696 Thrombocytopenia, unspecified: Secondary | ICD-10-CM | POA: Diagnosis present

## 2023-08-24 DIAGNOSIS — K7682 Hepatic encephalopathy: Principal | ICD-10-CM | POA: Diagnosis present

## 2023-08-24 DIAGNOSIS — I672 Cerebral atherosclerosis: Secondary | ICD-10-CM | POA: Diagnosis present

## 2023-08-24 DIAGNOSIS — R4182 Altered mental status, unspecified: Secondary | ICD-10-CM | POA: Diagnosis present

## 2023-08-24 DIAGNOSIS — E872 Acidosis, unspecified: Secondary | ICD-10-CM | POA: Diagnosis present

## 2023-08-24 DIAGNOSIS — K7581 Nonalcoholic steatohepatitis (NASH): Secondary | ICD-10-CM | POA: Diagnosis not present

## 2023-08-24 DIAGNOSIS — Z881 Allergy status to other antibiotic agents status: Secondary | ICD-10-CM

## 2023-08-24 DIAGNOSIS — M40204 Unspecified kyphosis, thoracic region: Secondary | ICD-10-CM | POA: Diagnosis present

## 2023-08-24 DIAGNOSIS — K7460 Unspecified cirrhosis of liver: Secondary | ICD-10-CM | POA: Diagnosis not present

## 2023-08-24 DIAGNOSIS — Z888 Allergy status to other drugs, medicaments and biological substances status: Secondary | ICD-10-CM | POA: Diagnosis not present

## 2023-08-24 DIAGNOSIS — Z8049 Family history of malignant neoplasm of other genital organs: Secondary | ICD-10-CM

## 2023-08-24 DIAGNOSIS — E039 Hypothyroidism, unspecified: Secondary | ICD-10-CM | POA: Diagnosis present

## 2023-08-24 DIAGNOSIS — K7290 Hepatic failure, unspecified without coma: Secondary | ICD-10-CM | POA: Diagnosis not present

## 2023-08-24 DIAGNOSIS — M109 Gout, unspecified: Secondary | ICD-10-CM | POA: Diagnosis not present

## 2023-08-24 DIAGNOSIS — N1831 Chronic kidney disease, stage 3a: Secondary | ICD-10-CM | POA: Diagnosis present

## 2023-08-24 DIAGNOSIS — L304 Erythema intertrigo: Secondary | ICD-10-CM | POA: Diagnosis present

## 2023-08-24 DIAGNOSIS — I350 Nonrheumatic aortic (valve) stenosis: Secondary | ICD-10-CM | POA: Diagnosis present

## 2023-08-24 DIAGNOSIS — Z809 Family history of malignant neoplasm, unspecified: Secondary | ICD-10-CM

## 2023-08-24 DIAGNOSIS — I9589 Other hypotension: Secondary | ICD-10-CM | POA: Diagnosis present

## 2023-08-24 DIAGNOSIS — Z79890 Hormone replacement therapy (postmenopausal): Secondary | ICD-10-CM | POA: Diagnosis not present

## 2023-08-24 DIAGNOSIS — K219 Gastro-esophageal reflux disease without esophagitis: Secondary | ICD-10-CM | POA: Diagnosis present

## 2023-08-24 DIAGNOSIS — I10 Essential (primary) hypertension: Secondary | ICD-10-CM | POA: Diagnosis not present

## 2023-08-24 DIAGNOSIS — R109 Unspecified abdominal pain: Secondary | ICD-10-CM | POA: Diagnosis not present

## 2023-08-24 DIAGNOSIS — Z87442 Personal history of urinary calculi: Secondary | ICD-10-CM

## 2023-08-24 DIAGNOSIS — Z833 Family history of diabetes mellitus: Secondary | ICD-10-CM

## 2023-08-24 DIAGNOSIS — Z8349 Family history of other endocrine, nutritional and metabolic diseases: Secondary | ICD-10-CM

## 2023-08-24 DIAGNOSIS — Z79899 Other long term (current) drug therapy: Secondary | ICD-10-CM

## 2023-08-24 DIAGNOSIS — Z9049 Acquired absence of other specified parts of digestive tract: Secondary | ICD-10-CM

## 2023-08-24 DIAGNOSIS — Z880 Allergy status to penicillin: Secondary | ICD-10-CM

## 2023-08-24 LAB — AMMONIA: Ammonia: 117 umol/L — ABNORMAL HIGH (ref 9–35)

## 2023-08-24 LAB — BRAIN NATRIURETIC PEPTIDE: B Natriuretic Peptide: 72.9 pg/mL (ref 0.0–100.0)

## 2023-08-24 LAB — URINALYSIS, ROUTINE W REFLEX MICROSCOPIC
Bilirubin Urine: NEGATIVE
Glucose, UA: NEGATIVE mg/dL
Ketones, ur: NEGATIVE mg/dL
Leukocytes,Ua: NEGATIVE
Nitrite: NEGATIVE
Protein, ur: NEGATIVE mg/dL
Specific Gravity, Urine: 1.018 (ref 1.005–1.030)
pH: 6 (ref 5.0–8.0)

## 2023-08-24 LAB — RESP PANEL BY RT-PCR (RSV, FLU A&B, COVID)  RVPGX2
Influenza A by PCR: NEGATIVE
Influenza B by PCR: NEGATIVE
Resp Syncytial Virus by PCR: NEGATIVE
SARS Coronavirus 2 by RT PCR: NEGATIVE

## 2023-08-24 LAB — COMPREHENSIVE METABOLIC PANEL WITH GFR
ALT: 20 U/L (ref 0–44)
AST: 25 U/L (ref 15–41)
Albumin: 2.7 g/dL — ABNORMAL LOW (ref 3.5–5.0)
Alkaline Phosphatase: 112 U/L (ref 38–126)
Anion gap: 7 (ref 5–15)
BUN: 35 mg/dL — ABNORMAL HIGH (ref 8–23)
CO2: 15 mmol/L — ABNORMAL LOW (ref 22–32)
Calcium: 8.4 mg/dL — ABNORMAL LOW (ref 8.9–10.3)
Chloride: 114 mmol/L — ABNORMAL HIGH (ref 98–111)
Creatinine, Ser: 0.96 mg/dL (ref 0.44–1.00)
GFR, Estimated: >60 mL/min (ref >60)
Glucose, Bld: 131 mg/dL — ABNORMAL HIGH (ref 70–99)
Potassium: 3.7 mmol/L (ref 3.5–5.1)
Sodium: 136 mmol/L (ref 135–145)
Total Bilirubin: 1.6 mg/dL — ABNORMAL HIGH (ref 0.0–1.2)
Total Protein: 6 g/dL — ABNORMAL LOW (ref 6.5–8.1)

## 2023-08-24 LAB — BLOOD GAS, VENOUS
Acid-base deficit: 6.8 mmol/L — ABNORMAL HIGH (ref 0.0–2.0)
Bicarbonate: 17.7 mmol/L — ABNORMAL LOW (ref 20.0–28.0)
O2 Saturation: 83.6 %
Patient temperature: 37
pCO2, Ven: 32 mmHg — ABNORMAL LOW (ref 44–60)
pH, Ven: 7.35 (ref 7.25–7.43)
pO2, Ven: 51 mmHg — ABNORMAL HIGH (ref 32–45)

## 2023-08-24 LAB — CBC
HCT: 36 % (ref 36.0–46.0)
Hemoglobin: 10.9 g/dL — ABNORMAL LOW (ref 12.0–15.0)
MCH: 30.4 pg (ref 26.0–34.0)
MCHC: 30.3 g/dL (ref 30.0–36.0)
MCV: 100.6 fL — ABNORMAL HIGH (ref 80.0–100.0)
Platelets: 134 K/uL — ABNORMAL LOW (ref 150–400)
RBC: 3.58 MIL/uL — ABNORMAL LOW (ref 3.87–5.11)
RDW: 15.8 % — ABNORMAL HIGH (ref 11.5–15.5)
WBC: 6.3 K/uL (ref 4.0–10.5)
nRBC: 0 % (ref 0.0–0.2)

## 2023-08-24 LAB — LIPASE, BLOOD: Lipase: 44 U/L (ref 11–51)

## 2023-08-24 LAB — TROPONIN I (HIGH SENSITIVITY): Troponin I (High Sensitivity): 11 ng/L (ref <18)

## 2023-08-24 MED ORDER — LACTULOSE 10 GM/15ML PO SOLN
30.0000 g | Freq: Four times a day (QID) | ORAL | Status: DC
  Administered 2023-08-24 – 2023-08-29 (×16): 30 g via ORAL
  Filled 2023-08-24 (×12): qty 45
  Filled 2023-08-24: qty 60
  Filled 2023-08-24 (×5): qty 45

## 2023-08-24 MED ORDER — LACTULOSE 10 GM/15ML PO SOLN: 30.0000 g | Freq: Three times a day (TID) | ORAL | Status: DC

## 2023-08-24 MED ORDER — NYSTATIN 100000 UNIT/GM EX POWD
Freq: Two times a day (BID) | CUTANEOUS | Status: DC
  Filled 2023-08-24: qty 15

## 2023-08-24 MED ORDER — LACTULOSE 10 GM/15ML PO SOLN: 10.0000 g | Freq: Once | ORAL | Status: DC

## 2023-08-24 MED ORDER — GUAIFENESIN-DM 100-10 MG/5ML PO SYRP: 5.0000 mL | ORAL_SOLUTION | Freq: Four times a day (QID) | ORAL | Status: DC | PRN

## 2023-08-24 MED ORDER — SODIUM BICARBONATE 8.4 % IV SOLN
25.0000 meq | Freq: Once | INTRAVENOUS | Status: AC
  Administered 2023-08-24: 25 meq via INTRAVENOUS
  Filled 2023-08-24: qty 50

## 2023-08-24 NOTE — Patient Instructions (Signed)
 Visit Information  Thank you for taking time to visit with me today. Please don't hesitate to contact me if I can be of assistance to you before our next scheduled telephone appointment.  Following is a copy of your care plan:   Goals Addressed             This Visit's Progress    [facility] Transitions of Care (TOC) Care Plan       Problems:  Recent Hospitalization for treatment of hepatic encephalopathy- dehydration with frequent BM's Son, [name] is caregiver and needs help with Medicaid application No hospital follow up with PCP 08/24/23- spoke with son [name] who reports pt is in the ED for increased confusion, states she has all medications and has been taking as prescribed including lactulose , reports  she's having a lot of bowel movements everyday  Goal:  Over the next 30 days, the patient will not experience hospital readmission  Interventions:  Transitions of Care: Doctor Visits  - discussed the importance of doctor visits Reviewed Signs and symptoms of infection Reviewed importance of timely follow up with PCP and son will call and make an appointment.  Cirrhosis Evaluation of current treatment plan related to cirrhosis, Financial constraints related to son reports need for medicaid for cost of medical expense and son reports patient need a power scooter or wheelchair and out of pocket cost of 3,000 self-management and patient's adherence to plan as established by provider. Discussed plans with patient for ongoing care management follow up and provided patient with direct contact information for care management team Evaluation of current treatment plan related to cirrhosis  and patient's adherence to plan as established by provider Reviewed medications with patient and discussed importance of taking all medications as prescribed. Long discussion about lactulose  and importance of bowel movements to keep ammonia down. Reviewed scheduled/upcoming provider appointments including needing  PCP follow up Advised patient/ son to discuss frequent readmission with provider Encouraged son to assist to follow low salt diet since he provides meals Reviewed importance of daily weight and keeping a logs of weights.  Reviewed importance of protein in diet.  Reviewed bleeding risk due to cirrhosis. Encouraged son to observe for any signs of infection and get medical treatment for infections asap.  Reviewed plan of care with son [name], will continue to follow   Patient Self Care Activities:  Attend all scheduled provider appointments Attend church or other social activities Call pharmacy for medication refills 3-7 days in advance of running out of medications Call provider office for new concerns or questions  Notify RN Care Manager of TOC call rescheduling needs Participate in Transition of Care Program/Attend TOC scheduled calls Perform all self care activities independently  Take medications as prescribed   Work with the social worker to address care coordination needs and will continue to work with the clinical team to address health care and disease management related needs Do not skip lactulose  Increase protein in diet to help with cirrhosis Get emergency for bleeding- rectal blood, dark black stools or vomiting of blood  Plan:  The patient has been provided with contact information for the care management team and has been advised to call with any health related questions or concerns.  Will continue to follow, pt is in ED at present        Patient verbalizes understanding of instructions and care plan provided today and agrees to view in MyChart. Active MyChart status and patient understanding of how to access instructions and care plan  via MyChart confirmed with patient.     Will continue to follow   Please call the care guide team at [phone] if you need to cancel or reschedule your appointment.   Please call the Suicide and Crisis Lifeline: 988 call the [place]   National Suicide Prevention Lifeline: 253-528-8680 or TTY: 401-815-4740 TTY 7860852215) to talk to a trained counselor call 1-800-273-TALK (toll free, 24 hour hotline) go to [facility] [address] [place] [phone]) call 911 if you are experiencing a Mental Health or Behavioral Health Crisis or need someone to talk to  Mliss Creed [facility], BSN RN Care Manager/ Transition of Care [place] [phone]

## 2023-08-24 NOTE — ED Provider Triage Note (Signed)
 Emergency Medicine Provider Triage Evaluation Note  [name] , a 68 y.o. female  was evaluated in triage.  Pt complains of confusion. Son report increase confusion and crying spell x 2 days.  Also having increased BMs. Has rash below left breast for a few days.  Hx of NASH and liver cirrhosis as well as multiple admissions for acute hepatic encephalopathy  Review of Systems  Positive: As above Negative: As above  Physical Exam  BP 123/63 (BP Location: Left Arm)   Pulse 80   Temp 98.5 F (36.9 C) (Oral)   Resp 18   SpO2 99%  Gen:   Awake, no distress   Resp:  Normal effort  MSK:   Moves extremities without difficulty  Other:    Medical Decision Making  Medically screening exam initiated at 3:39 PM.  Appropriate orders placed.  [name] was informed that the remainder of the evaluation will be completed by another provider, this initial triage assessment does not replace that evaluation, and the importance of remaining in the ED until their evaluation is complete.     Nivia Colon, PA-C 08/24/23 1541

## 2023-08-24 NOTE — H&P (Signed)
 History and Physical    [name] FMW:[id number] DOB: 12-Mar-1955 DOA: 08/24/2023  PCP: [name], MD  Patient coming from: Home  Chief Complaint: AMS  HPI: [name] is a 68 y.o. female with medical history significant of NASH cirrhosis, recurrent hepatic encephalopathy, short gut syndrome, chronic diarrhea, CKD stage IIIa, vaginal bleeding secondary to uterine fibroids, chronic anemia, hyperlipidemia, DVT/PE in the past, depression, anxiety, hypothyroidism, GERD, chronic hypotension on midodrine , class II obesity, severe aortic valve stenosis per echo 01/03/2023.  Recent hospital admission 7/29-8/3 for hepatic encephalopathy and was discharged on lactulose  30 g 3 times daily.   Patient presents to the ED today for evaluation of altered mental status. Patient is slightly confused.  History provided mostly by her son at bedside who states since after her last hospitalization for hepatic encephalopathy, patient has been receiving lactulose  30 g 3 times a day and having multiple bowel movements a day.  However, despite lactulose  compliance, she has been more confused so he has been giving her lactulose  30 g 4 times a day for the past 2 days.  Son states patient has had cough and runny nose for the past few days but no fevers.  Patient denies shortness of breath or chest pain.  She is reporting chronic upper abdominal pain with son states is related to patient having an abdominal wall hernia.  Patient thinks her abdominal pain is worse than her baseline.  She has felt nauseous but has not vomited.  Son states patient has been complaining of pain in her right great toe but has not injured the toe.  He thinks she might be having a gout flare.  Patient also confirms that she has not injured her toe or foot.  Son is also reporting patient having a rash in the skin fold underneath her left breast and he thinks she might be have a yeast infection.  ED Course: Vital signs stable.  Labs showing no  leukocytosis, hemoglobin 10.9 (at baseline), platelet count 134k (stable), chloride 114, bicarb 15, glucose 131, BUN 35, creatinine 0.96, T. bili 1.6 (improved compared to labs 12 days ago), transaminases and alkaline phosphatase normal, lipase normal, UA not suggestive of infection, BNP normal, COVID/influenza/RSV PCR negative, initial troponin negative and repeat pending, VBG showing pH 7.35 and pCO2 32, ammonia level 117.  Chest x-ray showing no acute abnormality.  Review of Systems:  Review of Systems  All other systems reviewed and are negative.   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   Hyperammonemia (HCC) 09/30/2022   Hypertension    Kidney stone    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   Obesity    Pulmonary emboli (HCC) 01/10/2009   Pulmonary embolism (HCC)    Short gut syndrome    Thyroid  disease     Past Surgical History:  Procedure Laterality Date   ABDOMINAL WALL MESH  REMOVAL     2016   CESAREAN SECTION WITH BILATERAL TUBAL LIGATION  04/30/1985   CHOLECYSTECTOMY  05/2007   Exploratory laparotomy, lysis of adhesions, takedown of ileostomy with small bowel resection, open cholecystectomy and ileocolostomy   COLON SURGERY  01/2007   exploratory laparotomy with right colecotmy and end ileostomy   COLONOSCOPY  04/02/2019   Dr Gunnar Chris Daniels, MD [facility]   ESOPHAGOGASTRODUODENOSCOPY (EGD) WITH PROPOFOL  N/A 10/19/2022   Procedure:  ESOPHAGOGASTRODUODENOSCOPY (EGD) WITH PROPOFOL ;  Surgeon: Saintclair Jasper, MD;  Location: [facility];  Service: Gastroenterology;  Laterality: N/A;   ESOPHAGOSCOPY  04/02/2019   EYE SURGERY Left 10/06/2020   Cataract extraciton w/intraocular lens implant- Dr Caresse   HERNIA REPAIR  10/2007   ILEOSTOMY  2009   states bowel perfotation wih colonsocopy   ILEOSTOMY CLOSURE  2009   INCISIONAL HERNIA REPAIR  10/11/2018   IR KYPHO THORACIC WITH BONE BIOPSY  08/31/2022   IR KYPHO THORACIC WITH BONE BIOPSY  10/27/2022   IR RADIOLOGIST EVAL & MGMT  09/27/2022   KIDNEY STONE SURGERY     LIVER RESECTION     2009   PANNICULECTOMY     repair of abdominal wall hernia   WRIST SURGERY     tendonitis     reports that she quit smoking about 38 years ago. Her smoking use included cigarettes. She has been exposed to tobacco smoke. She has never used smokeless tobacco. She reports that she does not drink alcohol and does not use drugs.  Allergies  Allergen Reactions   Ace Inhibitors Anaphylaxis and Swelling   Gadolinium Derivatives Anaphylaxis   Ivp Dye [Iodinated Contrast Media] Anaphylaxis   Wound Dressing Adhesive Dermatitis   Desitin [Zinc  Oxide] Rash and Other (See Comments)    Worsens rash   Yellow Jacket Venom Swelling   Chlorhexidine  Rash and Other (See Comments)    WORSENS RASHES   Doxycycline Rash   Penicillins Dermatitis and Rash   Zofran  [Ondansetron ] Rash    Family History  Problem Relation Age of Onset   Uterine cancer Mother    Bone cancer Father    Heart disease Father    Hyperlipidemia Father    Dementia Father    Hypertension Father    Skin cancer Sister    Hypertension Sister    Diabetes Sister    Hyperlipidemia Brother    Hypertension Brother    Heart disease Brother     Prior to Admission medications   Medication Sig Start Date End Date Taking? Authorizing Provider  albuterol  (VENTOLIN  HFA) 108 (90 Base) MCG/ACT inhaler Inhale 2 puffs into the lungs every 6  (six) hours as needed for shortness of breath or wheezing. Patient not taking: Reported on 08/17/2023 02/13/23   [provider]  atorvastatin  (LIPITOR) 40 MG tablet Take 1 tablet (40 mg total) by mouth in the morning. 02/22/23   [name], MD  copper  tablet Take 2 mg by mouth in the morning and at bedtime.    [provider]  cyanocobalamin  (VITAMIN B12) 1000 MCG tablet Take 2,000 mcg by mouth every Monday, Wednesday, and Friday.    [provider]  dicyclomine  (BENTYL ) 10 MG capsule Take 1 capsule (10 mg total) by mouth 3 (three) times daily as needed for spasms (abdominal cramping). 08/07/23   [name], MD  EPINEPHrine  0.3 mg/0.3 mL IJ SOAJ injection Inject 0.3 mg into the muscle as needed for anaphylaxis. Patient not taking: Reported on 08/17/2023 07/27/23   [name], MD  famotidine  (PEPCID ) 20 MG tablet Take 20 mg by mouth daily as needed  for heartburn or indigestion.    [provider]  folic acid  (FOLVITE ) 1 MG tablet Take 1 tablet (1 mg total) by mouth daily. 06/16/22   Rizwan, [name], MD  furosemide  (LASIX ) 40 MG tablet Take 1 tablet (40 mg total) by mouth 2 (two) times daily. 08/07/23   [name], MD  HYDROcodone -acetaminophen  (NORCO) 10-325 MG tablet Take 1 tablet by mouth every 8 (eight) hours as needed for moderate pain (pain score 4-6). 06/15/23   [provider]  IRON -VITAMIN C  PO Take 1 tablet by mouth daily with breakfast.    [provider]  lactulose  (CHRONULAC ) 10 GM/15ML solution Take 45 mLs (30 g total) by mouth 3 (three) times daily. Take to ensure that there is 2 loose bowel movement EVERY DAY Patient taking differently: Take 30 g by mouth 2 (two) times daily. 05/29/23   Cheryle Page, MD  levothyroxine  (SYNTHROID ) 175 MCG tablet Take 1 tablet (175 mcg total) by mouth daily at 6 (six) AM. Recheck TSH with PCP in 4 weeks 06/26/23   [name], MD  lidocaine  (LIDODERM ) 5 % Place 1 patch onto the skin daily.  Remove & Discard patch within 12 hours or as directed by MD 10/31/22   Tobie Yetta HERO, MD  magnesium  gluconate (MAGONATE) 500 (27 Mg) MG TABS tablet Take 1 tablet (500 mg total) by mouth in the morning and at bedtime. 05/01/23   [name], MD  meclizine  (ANTIVERT ) 25 MG tablet Take 1 tablet (25 mg total) by mouth 3 (three) times daily as needed for dizziness. 05/29/23   Cheryle Page, MD  megestrol  (MEGACE ) 40 MG tablet Take 1 tablet (40 mg total) by mouth 2 (two) times daily. 05/04/23   Ajewole, [name], MD  methocarbamol  (ROBAXIN ) 500 MG tablet Take 500 mg by mouth in the morning.    [provider]  midodrine  (PROAMATINE ) 10 MG tablet TAKE 1 TABLET(10 MG) BY MOUTH THREE TIMES DAILY WITH MEALS 06/26/23   [name], MD  Multiple Vitamins-Minerals (CENTRUM SILVER ULTRA WOMENS PO) Take 1 tablet by mouth in the morning.    [provider]  naloxone [facility]) nasal spray 4 mg/0.1 mL Place 1 spray into the nose as needed (accidental overdose). Patient not taking: Reported on 08/17/2023 03/25/22   [provider]  nystatin  cream (MYCOSTATIN ) Apply 1 Application topically 2 (two) times daily as needed for dry skin. 05/29/23   Cheryle Page, MD  pantoprazole  (PROTONIX ) 40 MG tablet Take 1 tablet (40 mg total) by mouth 2 (two) times daily before a meal. 06/09/23   [name], MD  potassium chloride  (KLOR-CON ) 10 MEQ tablet Take 2 tablets (20 mEq total) by mouth 2 (two) times daily. 08/07/23   [name], MD  rifaximin  (XIFAXAN ) 550 MG TABS tablet Take 550 mg by mouth 2 (two) times daily.    [provider]  rOPINIRole  (REQUIP ) 0.25 MG tablet TAKE 1 TABLET(0.25 MG) BY MOUTH AT BEDTIME 06/10/23   [name], MD  senna-docusate (SENOKOT-S) 8.6-50 MG tablet TAKE 1 TABLET BY MOUTH TWICE DAILY 08/02/23   [name], MD  spironolactone  (ALDACTONE ) 50 MG tablet TAKE 1 TABLET(50 MG) BY MOUTH DAILY 08/02/23   [name], MD  triamcinolone  cream (KENALOG )  0.1 % Apply 1 Application topically 2 (two) times daily as needed (for itching- affected areas).    [provider]  venlafaxine  XR (EFFEXOR -XR) 75 MG 24 hr capsule Take 1 capsule (75 mg total) by mouth daily with breakfast. 07/20/23   [name], MD  Vitamin D , Ergocalciferol , (DRISDOL) 50000 UNITS CAPS Take 50,000 Units by mouth every Wednesday.    [provider]    Physical Exam: Vitals:   08/24/23 1518 08/24/23 1700 08/24/23 1947  BP: 123/63 (!) 157/72 123/64  Pulse: 80 78 74  Resp: 18 20 18   Temp: 98.5 F (36.9 C)  98.8 F (37.1 C)  TempSrc: Oral  Axillary  SpO2: 99% 100% 100%    Physical Exam Vitals reviewed.  Constitutional:      General: She is not in acute distress. HENT:     Head: Normocephalic and atraumatic.  Eyes:     Extraocular Movements: Extraocular movements intact.  Cardiovascular:     Rate and Rhythm: Normal rate and regular rhythm.     Pulses: Normal pulses.     Heart sounds: Murmur heard.  Pulmonary:     Effort: Pulmonary effort is normal. No respiratory distress.     Breath sounds: Normal breath sounds. No wheezing, rhonchi or rales.  Abdominal:     General: Bowel sounds are normal. There is no distension.     Palpations: Abdomen is soft.     Tenderness: There is abdominal tenderness. There is no guarding.     Hernia: A hernia is present.  Musculoskeletal:     Cervical back: Normal range of motion.     Comments: Mild pitting edema at the level of ankles bilaterally No swelling or deformity of right great toe, nontender to palpation, and range of motion intact.  Skin:    General: Skin is warm and dry.     Comments: Intertrigo noted in the skin fold underneath left breast  Neurological:     General: No focal deficit present.     Mental Status: She is alert.     Sensory: No sensory deficit.     Motor: No weakness.     Comments: Oriented to person and place.  She knows the month is August and today is Thursday but does not  remember the exact date or year. Speech fluent, no facial droop     Labs on Admission: I have personally reviewed following labs and imaging studies  CBC: Recent Labs  Lab 08/24/23 1544  WBC 6.3  HGB 10.9*  HCT 36.0  MCV 100.6*  PLT 134*   Basic Metabolic Panel: Recent Labs  Lab 08/24/23 1544  NA 136  K 3.7  CL 114*  CO2 15*  GLUCOSE 131*  BUN 35*  CREATININE 0.96  CALCIUM  8.4*   GFR: CrCl cannot be calculated (Unknown ideal weight.). Liver Function Tests: Recent Labs  Lab 08/24/23 1544  AST 25  ALT 20  ALKPHOS 112  BILITOT 1.6*  PROT 6.0*  ALBUMIN  2.7*   Recent Labs  Lab 08/24/23 1758  LIPASE 44   Recent Labs  Lab 08/24/23 1805  AMMONIA 117*   Coagulation Profile: No results for input(s): INR, PROTIME in the last 168 hours. Cardiac Enzymes: No results for input(s): CKTOTAL, CKMB, CKMBINDEX, TROPONINI in the last 168 hours. BNP (last 3 results) No results for input(s): PROBNP in the last 8760 hours. HbA1C: No results for input(s): HGBA1C in the last 72 hours. CBG: No results for input(s): GLUCAP in the last 168 hours. Lipid Profile: No results for input(s): CHOL, HDL, LDLCALC, TRIG, CHOLHDL, LDLDIRECT in the last 72 hours. Thyroid  Function Tests: No results for input(s): TSH, T4TOTAL, FREET4, T3FREE, THYROIDAB in the last 72 hours. Anemia Panel: No results for input(s): VITAMINB12, FOLATE, FERRITIN, TIBC, IRON , RETICCTPCT in the last  72 hours. Urine analysis:    Component Value Date/Time   COLORURINE YELLOW 08/24/2023 1544   APPEARANCEUR CLEAR 08/24/2023 1544   LABSPEC 1.018 08/24/2023 1544   PHURINE 6.0 08/24/2023 1544   GLUCOSEU NEGATIVE 08/24/2023 1544   HGBUR LARGE (A) 08/24/2023 1544   BILIRUBINUR NEGATIVE 08/24/2023 1544   KETONESUR NEGATIVE 08/24/2023 1544   PROTEINUR NEGATIVE 08/24/2023 1544   UROBILINOGEN 0.2 08/21/2013 0017   NITRITE NEGATIVE 08/24/2023 1544   LEUKOCYTESUR  NEGATIVE 08/24/2023 1544    Radiological Exams on Admission: DG Chest 1 View Result Date: 08/24/2023 CLINICAL DATA:  Shortness of breath. Altered mental status. Ex-smoker. EXAM: CHEST  1 VIEW COMPARISON:  08/08/2023 FINDINGS: Stable poor inspiration, borderline enlarged cardiac silhouette and tortuous and partially calcified thoracic aorta. Clear lungs with normal vascularity. Unremarkable bones. IMPRESSION: No acute abnormality. Electronically Signed   By: Elspeth Bathe M.D.   On: 08/24/2023 16:49    EKG: Independently reviewed. Difficult to interpret due to significant baseline wander but appears to be showing sinus rhythm.  Repeat EKG ordered and currently pending.   Assessment and Plan  Recurrent hepatic encephalopathy History of NASH cirrhosis Patient's son confirms that patient has been receiving lactulose  30 g 3 times a day since after her recent hospital discharge and having multiple bowel movements per day. However, despite lactulose  compliance, she has been more confused so he has been giving her lactulose  30 g 4 times a day for the past 2 days.  Ammonia level elevated at 117.  UA not suggestive of infection.  TSH was normal on 07/24/2023.  Continue lactulose  30 g 4 times a day and monitor ammonia level.  Keeping n.p.o. until stat CT abdomen pelvis is done given abdominal pain and tenderness (see below).  If CT negative, okay to continue oral lactulose .  No obvious focal neurologic deficit on exam, stat CT head ordered.  Cough, rhinorrhea No fever or leukocytosis.  COVID/influenza/RSV PCR negative.  Chest x-ray showing no acute abnormality.  Symptomatic management.  Acute on chronic upper abdominal pain and nausea History of abdominal wall hernia Troponin negative x 1 and patient is not endorsing chest pain.  T. bili 1.6 (improved compared to labs 12 days ago), transaminases and alkaline phosphatase normal, lipase normal, UA not suggestive of infection.  Patient does have abdominal  tenderness on exam and stat CT abdomen pelvis ordered to rule out hernia incarceration.  Follow-up repeat troponin.  Intertrigo of skin fold under left breast Nystatin  powder.  Right great toe pain History of gout No obvious swelling or deformity on exam.  No injury reported.  Check uric acid level.   Metabolic acidosis Replace bicarb and monitor labs.  Chronic anemia Hemoglobin at baseline.  Chronic thrombocytopenia Platelet count stable.  Chronic hypotension: Blood pressure currently stable. Hyperlipidemia Depression/anxiety Hypothyroidism GERD Continue home medications after pharmacy med rec is done.  DVT prophylaxis: SCDs Code Status: DNR pre-arrest interventions desired (discussed with the patient and her son) Family Communication: Son at bedside. Level of care: Progressive Care Unit Admission status: It is my clinical opinion that admission to INPATIENT is reasonable and necessary because of the expectation that this patient will require hospital care that crosses at least 2 midnights to treat this condition based on the medical complexity of the problems presented.  Given the aforementioned information, the predictability of an adverse outcome is felt to be significant.  Editha Ram MD [facility]  If 7PM-7AM, please contact night-coverage [email or web address]  08/24/2023, 9:15 PM

## 2023-08-24 NOTE — ED Notes (Signed)
 PT TO CT

## 2023-08-24 NOTE — Patient Outreach (Signed)
 Transition of Care week 2  Visit Note  08/24/2023  Name: [name] MRN: [id number]          DOB: Jan 07, 1956  Situation: Patient enrolled in [facility] 30-day program. Visit completed with patient by telephone.   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   Hyperammonemia (HCC) 09/30/2022   Hypertension    Kidney stone    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  Medications Reviewed Today     Reviewed by Aura Mliss LABOR, RN (Registered Nurse) on 08/24/23 at 1527  Med List Status: <None>   Medication Order Taking? Sig Documenting Provider Last Dose Status Informant  albuterol  (VENTOLIN  HFA) 108 (90 Base) MCG/ACT inhaler [id number]  Inhale 2 puffs into the lungs every 6 (six) hours as needed for shortness of breath or wheezing.  Patient not taking: Reported on 08/17/2023   [provider]  Active Family  Member  atorvastatin  (LIPITOR) 40 MG tablet [id number]  Take 1 tablet (40 mg total) by mouth in the morning. [name], MD  Active Family Member  copper  tablet [id number]  Take 2 mg by mouth in the morning and at bedtime. [provider]  Active Family Member  cyanocobalamin  (VITAMIN B12) 1000 MCG tablet [id number]  Take 2,000 mcg by mouth every Monday, Wednesday, and Friday. [provider]  Active Family Member  dicyclomine  (BENTYL ) 10 MG capsule [id number]  Take 1 capsule (10 mg total) by mouth 3 (three) times daily as needed for spasms (abdominal cramping). [name], MD  Active Family Member  EPINEPHrine  0.3 mg/0.3 mL IJ SOAJ injection [id number]  Inject 0.3 mg into the muscle as needed for anaphylaxis.  Patient not taking: Reported on 08/17/2023   [name], MD  Active Family Member  famotidine  (PEPCID ) 20 MG tablet [id number]  Take 20 mg by mouth daily as needed for heartburn or indigestion. [provider]  Active Family Member  folic acid  (FOLVITE ) 1 MG tablet [id number]  Take 1 tablet (1 mg total) by mouth daily. Rizwan, [name], MD  Active Family Member  furosemide  (LASIX ) 40 MG tablet [id number]  Take 1 tablet (40 mg  total) by mouth 2 (two) times daily. [name], MD  Active Family Member  HYDROcodone -acetaminophen  [facility]) 10-325 MG tablet [id number]  Take 1 tablet by mouth every 8 (eight) hours as needed for moderate pain (pain score 4-6). [provider]  Active Family Member  IRON -VITAMIN C  PO [id number]  Take 1 tablet by mouth daily with breakfast. [provider]  Active Family Member  lactulose  (CHRONULAC ) 10 GM/15ML solution [id number]  Take 45 mLs (30 g total) by mouth 3 (three) times daily. Take to ensure that there is 2 loose bowel movement EVERY DAY  Patient taking differently: Take 30 g by mouth 2 (two) times daily.   Cheryle Page, MD  Active Family Member  levothyroxine  (SYNTHROID ) 175 MCG tablet [id number]  Take 1 tablet  (175 mcg total) by mouth daily at 6 (six) AM. Recheck TSH with PCP in 4 weeks [name], MD  Active Family Member  lidocaine  (LIDODERM ) 5 % [id number]  Place 1 patch onto the skin daily. Remove & Discard patch within 12 hours or as directed by MD Tobie Yetta HERO, MD  Active Family Member           Med Note ARNE[facility]   Sat Dec 10, 2022 11:45 AM)    magnesium  gluconate (MAGONATE) 500 (27 Mg) MG TABS tablet [id number]  Take 1 tablet (500 mg total) by mouth in the morning and at bedtime. [name], MD  Active Family Member  meclizine  (ANTIVERT ) 25 MG tablet [id number]  Take 1 tablet (25 mg total) by mouth 3 (three) times daily as needed for dizziness. Cheryle Page, MD  Active Family Member  megestrol  (MEGACE ) 40 MG tablet [id number]  Take 1 tablet (40 mg total) by mouth 2 (two) times daily. Ajewole, [name], MD  Active Family Member  methocarbamol  (ROBAXIN ) 500 MG tablet [id number]  Take 500 mg by mouth in the morning. [provider]  Active Family Member  midodrine  (PROAMATINE ) 10 MG tablet [id number]  TAKE 1 TABLET(10 MG) BY MOUTH THREE TIMES DAILY WITH MEALS [name], MD  Active Family Member  Multiple Vitamins-Minerals (CENTRUM SILVER ULTRA WOMENS PO) [id number]  Take 1 tablet by mouth in the morning. [provider]  Active Family Member  naloxone [facility]) nasal spray 4 mg/0.1 mL [id number]  Place 1 spray into the nose as needed (accidental overdose).  Patient not taking: Reported on 08/17/2023   [provider]  Active Family Member           Med Note ROLENE [place], [place] A   Wed Mar 08, 2023  2:39 PM)    nystatin  cream (MYCOSTATIN ) [id number]  Apply 1 Application topically 2 (two) times daily as needed for dry skin. Cheryle Page, MD  Active Family Member  pantoprazole  (PROTONIX ) 40 MG tablet [id number]  Take 1 tablet (40 mg total) by mouth 2 (two) times daily before a meal. [name], MD  Active Family Member  potassium chloride   (KLOR-CON ) 10 MEQ tablet [id number]  Take 2 tablets (20 mEq total) by mouth 2 (two) times daily. [name], MD  Active Family Member  rifaximin  (XIFAXAN ) 550 MG TABS tablet [id number]  Take 550 mg by mouth 2 (two) times daily. [provider]  Active Family Member  rOPINIRole  (REQUIP ) 0.25 MG tablet [id number]  TAKE 1 TABLET(0.25 MG) BY MOUTH AT BEDTIME [name], MD  Active Family Member  senna-docusate (SENOKOT-S) 8.6-50 MG tablet [id number]  TAKE 1 TABLET BY MOUTH TWICE DAILY [name], MD  Active Family Member  spironolactone  (ALDACTONE ) 50 MG tablet [id number]  TAKE 1 TABLET(50 MG) BY MOUTH DAILY [name], MD  Active Family Member  triamcinolone  cream (KENALOG ) 0.1 % [id number]  Apply 1 Application topically 2 (two) times daily as needed (for itching- affected areas). [provider]  Active Family Member  venlafaxine  XR (EFFEXOR -XR) 75 MG 24 hr capsule [id number]  Take 1 capsule (75 mg total) by mouth daily with breakfast. [name], MD  Active Family Member  Vitamin D , Ergocalciferol , (DRISDOL) 50000 UNITS CAPS [id number]  Take 50,000 Units by mouth every Wednesday. [provider]  Active Family Member  Med List Note Rolene Evern Devere DELENA Bishop 03/08/23 1450): Pt's son Avanell Banwart helps with medications            Recommendation:    Follow Up Plan:   Continue to follow, pt is in the ED  Mliss Creed [facility], BSN RN Care Manager/ Transition of Care [place]/ [facility] [phone]

## 2023-08-24 NOTE — ED Notes (Signed)
 Assisted pt off the bedside commode

## 2023-08-24 NOTE — ED Notes (Signed)
 Pt requested assistance to the bedside commode

## 2023-08-24 NOTE — ED Provider Notes (Signed)
 [facility] AT [facility] Provider Note   CSN: [id number] Arrival date & time: 08/24/23  1507     Patient presents with: Altered Mental Status   [name] is a 68 y.o. female.   68 year old female presents for evaluation of confusion.  She states she has been feeling weak and had a cough but is a fairly poor historian.  She is awake and alert, oriented x 3.  Son at bedside helps provide history.  States has been confused for the last 2 days he is he was worried because her ammonia was going up.  States has given her extra lactulose  and she has had multiple bowel movements each day and he is concerned she is getting dehydrated.  She admits she has had a cough and decreased p.o. intake as well.   Altered Mental Status Associated symptoms: light-headedness and nausea   Associated symptoms: no abdominal pain, no fever, no palpitations, no rash, no seizures and no vomiting        Prior to Admission medications   Medication Sig Start Date End Date Taking? Authorizing Provider  albuterol  (VENTOLIN  HFA) 108 (90 Base) MCG/ACT inhaler Inhale 2 puffs into the lungs every 6 (six) hours as needed for shortness of breath or wheezing. Patient not taking: Reported on 08/17/2023 02/13/23   [provider]  atorvastatin  (LIPITOR) 40 MG tablet Take 1 tablet (40 mg total) by mouth in the morning. 02/22/23   [name], MD  copper  tablet Take 2 mg by mouth in the morning and at bedtime.    [provider]  cyanocobalamin  (VITAMIN B12) 1000 MCG tablet Take 2,000 mcg by mouth every Monday, Wednesday, and Friday.    [provider]  dicyclomine  (BENTYL ) 10 MG capsule Take 1 capsule (10 mg total) by mouth 3 (three) times daily as needed for spasms (abdominal cramping). 08/07/23   [name], MD  EPINEPHrine  0.3 mg/0.3 mL IJ SOAJ injection Inject 0.3 mg into the muscle as needed for anaphylaxis. Patient not taking: Reported on 08/17/2023  07/27/23   [name], MD  famotidine  (PEPCID ) 20 MG tablet Take 20 mg by mouth daily as needed for heartburn or indigestion.    [provider]  folic acid  (FOLVITE ) 1 MG tablet Take 1 tablet (1 mg total) by mouth daily. 06/16/22   Rizwan, [name], MD  furosemide  (LASIX ) 40 MG tablet Take 1 tablet (40 mg total) by mouth 2 (two) times daily. 08/07/23   [name], MD  HYDROcodone -acetaminophen  (NORCO) 10-325 MG tablet Take 1 tablet by mouth every 8 (eight) hours as needed for moderate pain (pain score 4-6). 06/15/23   [provider]  IRON -VITAMIN C  PO Take 1 tablet by mouth daily with breakfast.    [provider]  lactulose  (CHRONULAC ) 10 GM/15ML solution Take 45 mLs (30 g total) by mouth 3 (three) times daily. Take to ensure that there is 2 loose bowel movement EVERY DAY Patient taking differently: Take 30 g by mouth 2 (two) times daily. 05/29/23   Cheryle Page, MD  levothyroxine  (SYNTHROID ) 175 MCG tablet Take 1 tablet (175 mcg total) by mouth daily at 6 (six) AM. Recheck TSH with PCP in 4 weeks 06/26/23   [name], MD  lidocaine  (LIDODERM ) 5 % Place 1 patch onto the skin daily. Remove & Discard patch within 12 hours or as directed by MD 10/31/22   Tobie Yetta HERO, MD  magnesium  gluconate (MAGONATE) 500 (27 Mg) MG TABS tablet Take 1 tablet (500  mg total) by mouth in the morning and at bedtime. 05/01/23   [name], MD  meclizine  (ANTIVERT ) 25 MG tablet Take 1 tablet (25 mg total) by mouth 3 (three) times daily as needed for dizziness. 05/29/23   Cheryle Page, MD  megestrol  (MEGACE ) 40 MG tablet Take 1 tablet (40 mg total) by mouth 2 (two) times daily. 05/04/23   Ajewole, [name], MD  methocarbamol  (ROBAXIN ) 500 MG tablet Take 500 mg by mouth in the morning.    [provider]  midodrine  (PROAMATINE ) 10 MG tablet TAKE 1 TABLET(10 MG) BY MOUTH THREE TIMES DAILY WITH MEALS 06/26/23   [name], MD  Multiple Vitamins-Minerals (CENTRUM SILVER  ULTRA WOMENS PO) Take 1 tablet by mouth in the morning.    [provider]  naloxone [facility]) nasal spray 4 mg/0.1 mL Place 1 spray into the nose as needed (accidental overdose). Patient not taking: Reported on 08/17/2023 03/25/22   [provider]  nystatin  cream (MYCOSTATIN ) Apply 1 Application topically 2 (two) times daily as needed for dry skin. 05/29/23   Cheryle Page, MD  pantoprazole  (PROTONIX ) 40 MG tablet Take 1 tablet (40 mg total) by mouth 2 (two) times daily before a meal. 06/09/23   [name], MD  potassium chloride  (KLOR-CON ) 10 MEQ tablet Take 2 tablets (20 mEq total) by mouth 2 (two) times daily. 08/07/23   [name], MD  rifaximin  (XIFAXAN ) 550 MG TABS tablet Take 550 mg by mouth 2 (two) times daily.    [provider]  rOPINIRole  (REQUIP ) 0.25 MG tablet TAKE 1 TABLET(0.25 MG) BY MOUTH AT BEDTIME 06/10/23   [name], MD  senna-docusate (SENOKOT-S) 8.6-50 MG tablet TAKE 1 TABLET BY MOUTH TWICE DAILY 08/02/23   [name], MD  spironolactone  (ALDACTONE ) 50 MG tablet TAKE 1 TABLET(50 MG) BY MOUTH DAILY 08/02/23   [name], MD  triamcinolone  cream (KENALOG ) 0.1 % Apply 1 Application topically 2 (two) times daily as needed (for itching- affected areas).    [provider]  venlafaxine  XR (EFFEXOR -XR) 75 MG 24 hr capsule Take 1 capsule (75 mg total) by mouth daily with breakfast. 07/20/23   [name], MD  Vitamin D , Ergocalciferol , (DRISDOL) 50000 UNITS CAPS Take 50,000 Units by mouth every Wednesday.    [provider]    Allergies: Ace inhibitors, Gadolinium derivatives, Ivp dye [iodinated contrast media], Wound dressing adhesive, Desitin [zinc  oxide], Yellow jacket venom, Chlorhexidine , Doxycycline, Penicillins, and Zofran  [ondansetron ]    Review of Systems  Constitutional:  Positive for fatigue. Negative for chills and fever.  HENT:  Negative for ear pain and sore throat.   Eyes:  Negative for pain and visual  disturbance.  Respiratory:  Positive for cough. Negative for shortness of breath.   Cardiovascular:  Negative for chest pain and palpitations.  Gastrointestinal:  Positive for nausea. Negative for abdominal pain and vomiting.  Genitourinary:  Negative for dysuria and hematuria.  Musculoskeletal:  Negative for arthralgias and back pain.  Skin:  Negative for color change and rash.  Neurological:  Positive for light-headedness. Negative for seizures and syncope.       Admits confusion   All other systems reviewed and are negative.   Updated Vital Signs BP 131/63 (BP Location: Right Arm)   Pulse 74   Temp 98.8 F (37.1 C) (Axillary)   Resp 20   SpO2 100%   Physical Exam Vitals and nursing note reviewed.  Constitutional:      General: She is not in acute distress.  Appearance: Normal appearance. She is well-developed. She is not ill-appearing.  HENT:     Head: Normocephalic and atraumatic.  Eyes:     Conjunctiva/sclera: Conjunctivae normal.  Cardiovascular:     Rate and Rhythm: Normal rate and regular rhythm.     Pulses: Normal pulses.     Heart sounds: Normal heart sounds. No murmur heard. Pulmonary:     Effort: Pulmonary effort is normal. No respiratory distress.     Breath sounds: Normal breath sounds. No stridor. No wheezing or rhonchi.  Abdominal:     Palpations: Abdomen is soft.     Tenderness: There is no abdominal tenderness.  Musculoskeletal:        General: No swelling.     Cervical back: Neck supple.  Skin:    General: Skin is warm and dry.     Capillary Refill: Capillary refill takes less than 2 seconds.  Neurological:     General: No focal deficit present.     Mental Status: She is alert.  Psychiatric:        Mood and Affect: Mood normal.     (all labs ordered are listed, but only abnormal results are displayed) Labs Reviewed  COMPREHENSIVE METABOLIC PANEL WITH GFR - Abnormal; Notable for the following components:      Result Value   Chloride 114 (*)     CO2 15 (*)    Glucose, Bld 131 (*)    BUN 35 (*)    Calcium  8.4 (*)    Total Protein 6.0 (*)    Albumin  2.7 (*)    Total Bilirubin 1.6 (*)    All other components within normal limits  CBC - Abnormal; Notable for the following components:   RBC 3.58 (*)    Hemoglobin 10.9 (*)    MCV 100.6 (*)    RDW 15.8 (*)    Platelets 134 (*)    All other components within normal limits  URINALYSIS, ROUTINE W REFLEX MICROSCOPIC - Abnormal; Notable for the following components:   Hgb urine dipstick LARGE (*)    Bacteria, UA RARE (*)    All other components within normal limits  AMMONIA - Abnormal; Notable for the following components:   Ammonia 117 (*)    All other components within normal limits  BLOOD GAS, VENOUS - Abnormal; Notable for the following components:   pCO2, Ven 32 (*)    pO2, Ven 51 (*)    Bicarbonate 17.7 (*)    Acid-base deficit 6.8 (*)    All other components within normal limits  RESP PANEL BY RT-PCR (RSV, FLU A&B, COVID)  RVPGX2  BRAIN NATRIURETIC PEPTIDE  LIPASE, BLOOD  CBC  BASIC METABOLIC PANEL WITH GFR  AMMONIA  URIC ACID  TROPONIN I (HIGH SENSITIVITY)  TROPONIN I (HIGH SENSITIVITY)    EKG: EKG Interpretation Date/Time:  Thursday August 24 2023 16:38:20 EDT Ventricular Rate:  78 PR Interval:  202 QRS Duration:  85 QT Interval:  434 QTC Calculation: 492 R Axis:   4  Text Interpretation: Sinus rhythm Anterior infarct, old Baseline wander Compared to prior EKG from 06/28/2023 Confirmed by Gennaro Bouchard ([id number]) on 08/24/2023 5:25:43 PM  Radiology: [facility] Chest 1 View Result Date: 08/24/2023 CLINICAL DATA:  Shortness of breath. Altered mental status. Ex-smoker. EXAM: CHEST  1 VIEW COMPARISON:  08/08/2023 FINDINGS: Stable poor inspiration, borderline enlarged cardiac silhouette and tortuous and partially calcified thoracic aorta. Clear lungs with normal vascularity. Unremarkable bones. IMPRESSION: No acute abnormality. Electronically Signed   By: Elspeth  Robynn  M.D.   On: 08/24/2023 16:49     Procedures   Medications Ordered in the ED  lactulose  (CHRONULAC ) 10 GM/15ML solution 30 g (has no administration in time range)  nystatin  (MYCOSTATIN /NYSTOP ) topical powder (has no administration in time range)  sodium bicarbonate  injection 25 mEq (has no administration in time range)  guaiFENesin -dextromethorphan (ROBITUSSIN DM) 100-10 MG/5ML syrup 5 mL (has no administration in time range)                                    Medical Decision Making Social determinants of health: Patient was with son, unable to get around without difficulty, history of medication noncompliance  Cardiac monitor interpretation: Sinus rhythm, no ectopy  Patient here for altered mental status and confusion that sounds like her hepatic encephalopathy that she has had multiple times in the past.  I doubt compliance with lactulose  at this time it sounds like Nena started giving it to her again a few days ago.  Patient was stable vitals here in elevated ammonia but otherwise fairly unremarkable lab work.  She has been so weak at home that she has been unable to get around the house or even to the restroom.  Discussed patient's case with hospitalist and patient will be admitted for further workup and management.  Patient and family bedside agreeable with the plan.  Problems Addressed: Hepatic encephalopathy (HCC): undiagnosed new problem with uncertain prognosis History of cirrhosis: chronic illness or injury with exacerbation, progression, or side effects of treatment  Amount and/or Complexity of Data Reviewed External Data Reviewed: notes.    Details: Prior hospital records reviewed and ammonia reviewed and patient seems to have a baseline ammonia in the normal 100s.  She was recently admitted for this Labs: ordered. Decision-making details documented in ED Course.    Details: Labs ordered and reviewed by me patient with elevated ammonia at 170 but labs were significant  unremarkable Radiology: ordered and independent interpretation performed. Decision-making details documented in ED Course.    Details: Ordered and interpreted independently of radiology Chest x-ray: Shows no acute abnormality in the chest ECG/medicine tests: ordered and independent interpretation performed. Decision-making details documented in ED Course.    Details: Ordered and interpreted by me in the absence of cardiology and shows sinus rhythm, no STEMI or acute change when compared to prior Discussion of management or test interpretation with external provider(s): Dr. Alfornia -hospitalist-I spoke with her regarding the patient's case and patient will be admitted for further workup and management  Risk OTC drugs. Prescription drug management. Drug therapy requiring intensive monitoring for toxicity. Decision regarding hospitalization. Diagnosis or treatment significantly limited by social determinants of health.     Final diagnoses:  Hepatic encephalopathy [facility])  History of cirrhosis    ED Discharge Orders     None          Gennaro Duwaine CROME, DO 08/24/23 2248

## 2023-08-24 NOTE — ED Triage Notes (Signed)
 Family report worsening confusion x 2 days. Family report patient unable to follow commands and unable to take care of herself at home. Family report patient taking her home medication regularly. No report of nausea and vomiting.

## 2023-08-25 ENCOUNTER — Other Ambulatory Visit: Payer: Medicare (Managed Care) | Admitting: Licensed Clinical Social Worker

## 2023-08-25 DIAGNOSIS — K7682 Hepatic encephalopathy: Secondary | ICD-10-CM | POA: Diagnosis not present

## 2023-08-25 DIAGNOSIS — K7290 Hepatic failure, unspecified without coma: Secondary | ICD-10-CM | POA: Diagnosis not present

## 2023-08-25 DIAGNOSIS — R109 Unspecified abdominal pain: Secondary | ICD-10-CM

## 2023-08-25 DIAGNOSIS — K7460 Unspecified cirrhosis of liver: Secondary | ICD-10-CM

## 2023-08-25 DIAGNOSIS — D696 Thrombocytopenia, unspecified: Secondary | ICD-10-CM

## 2023-08-25 DIAGNOSIS — K7581 Nonalcoholic steatohepatitis (NASH): Secondary | ICD-10-CM | POA: Diagnosis not present

## 2023-08-25 LAB — BASIC METABOLIC PANEL WITH GFR
Anion gap: 6 (ref 5–15)
BUN: 32 mg/dL — ABNORMAL HIGH (ref 8–23)
CO2: 19 mmol/L — ABNORMAL LOW (ref 22–32)
Calcium: 8.2 mg/dL — ABNORMAL LOW (ref 8.9–10.3)
Chloride: 114 mmol/L — ABNORMAL HIGH (ref 98–111)
Creatinine, Ser: 0.96 mg/dL (ref 0.44–1.00)
GFR, Estimated: >60 mL/min (ref >60)
Glucose, Bld: 122 mg/dL — ABNORMAL HIGH (ref 70–99)
Potassium: 3.5 mmol/L (ref 3.5–5.1)
Sodium: 139 mmol/L (ref 135–145)

## 2023-08-25 LAB — CBC
HCT: 31.3 % — ABNORMAL LOW (ref 36.0–46.0)
Hemoglobin: 10 g/dL — ABNORMAL LOW (ref 12.0–15.0)
MCH: 31 pg (ref 26.0–34.0)
MCHC: 31.9 g/dL (ref 30.0–36.0)
MCV: 96.9 fL (ref 80.0–100.0)
Platelets: 112 K/uL — ABNORMAL LOW (ref 150–400)
RBC: 3.23 MIL/uL — ABNORMAL LOW (ref 3.87–5.11)
RDW: 15.8 % — ABNORMAL HIGH (ref 11.5–15.5)
WBC: 5.2 K/uL (ref 4.0–10.5)
nRBC: 0 % (ref 0.0–0.2)

## 2023-08-25 LAB — AMMONIA
Ammonia: 115 umol/L — ABNORMAL HIGH (ref 9–35)
Ammonia: 107 umol/L — ABNORMAL HIGH (ref 9–35)

## 2023-08-25 LAB — URIC ACID: Uric Acid, Serum: 9.5 mg/dL — ABNORMAL HIGH (ref 2.5–7.1)

## 2023-08-25 LAB — TROPONIN I (HIGH SENSITIVITY): Troponin I (High Sensitivity): 14 ng/L (ref <18)

## 2023-08-25 MED ORDER — LEVOTHYROXINE SODIUM 50 MCG PO TABS
175.0000 ug | ORAL_TABLET | Freq: Every day | ORAL | Status: DC
  Administered 2023-08-25 – 2023-08-29 (×5): 175 ug via ORAL
  Filled 2023-08-25 (×5): qty 1

## 2023-08-25 MED ORDER — HYDROCODONE-ACETAMINOPHEN 10-325 MG PO TABS
1.0000 | ORAL_TABLET | Freq: Three times a day (TID) | ORAL | Status: DC | PRN
  Administered 2023-08-25: 1 via ORAL
  Filled 2023-08-25: qty 1

## 2023-08-25 MED ORDER — VENLAFAXINE HCL ER 75 MG PO CP24
75.0000 mg | ORAL_CAPSULE | Freq: Every day | ORAL | Status: DC
  Administered 2023-08-26 – 2023-08-29 (×4): 75 mg via ORAL
  Filled 2023-08-25 (×4): qty 1

## 2023-08-25 MED ORDER — MIDODRINE HCL 5 MG PO TABS: 10.0000 mg | ORAL_TABLET | Freq: Three times a day (TID) | ORAL | Status: DC

## 2023-08-25 MED ORDER — RIFAXIMIN 550 MG PO TABS
550.0000 mg | ORAL_TABLET | Freq: Two times a day (BID) | ORAL | Status: DC
  Administered 2023-08-25 – 2023-08-29 (×8): 550 mg via ORAL
  Filled 2023-08-25 (×8): qty 1

## 2023-08-25 MED ORDER — OXYCODONE HCL 5 MG PO TABS
5.0000 mg | ORAL_TABLET | Freq: Four times a day (QID) | ORAL | Status: DC | PRN
  Administered 2023-08-25 – 2023-08-26 (×3): 5 mg via ORAL
  Filled 2023-08-25 (×3): qty 1

## 2023-08-25 MED ORDER — ROPINIROLE HCL 0.25 MG PO TABS
0.2500 mg | ORAL_TABLET | Freq: Every day | ORAL | Status: DC
  Administered 2023-08-25 – 2023-08-28 (×4): 0.25 mg via ORAL
  Filled 2023-08-25 (×4): qty 1

## 2023-08-25 NOTE — Progress Notes (Signed)
 Progress Note   Patient: [name] FMW:[id number] DOB: 02-13-1955 DOA: 08/24/2023     1 DOS: the patient was seen and examined on 08/25/2023   Brief hospital course: 68 year old woman PMH including Nash cirrhosis, recurrent hepatic encephalopathy, short gut syndrome, CKD presented with confusion.  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  Ninth hospitalization this year. Previous pattern has been to improve after several days of lactulose . Continue lactulose , resume rifaximin , trend serum ammonia.  Mentation slightly clouded.   Acute on chronic upper abdominal pain  Abdominal wall hernia Lipase and LFTs unremarkable.   CT abdomen pelvis showed new diffuse central mesenteric edema consider small bowel pathology or infectious/inflammatory process.  New presacral edema body wall edema.  Questionable descending and sigmoid colon wall thickening--doubt colitis, suspect related to lactulose , stable small ventral hernia, aortic atherosclerosis Generally tender on examination with some rebound, somewhat concerning examination.  Hernia is soft and reducible.  Will ask general surgery to evaluate given CT findings and clinical exam.  No ascites on CT.   Intertrigo of skin fold under left breast Nystatin  powder.   Right great toe pain History of gout Monitor   Metabolic acidosis Chronic.  Continue bicarb   Chronic anemia Hemoglobin at baseline.   Chronic thrombocytopenia Platelet count stable.   Chronic hypotension: Blood pressure currently stable. Hyperlipidemia Depression/anxiety Hypothyroidism GERD      Subjective:  Reports abd pain No n/v Bowels have been moving Feels groggy  Physical  Exam: Vitals:   08/25/23 0012 08/25/23 0233 08/25/23 0355 08/25/23 0757  BP: 124/61  138/74 (!) 122/56  Pulse: 71  99 79  Resp: 20  18 20   Temp: 98.6 F (37 C)  98.2 F (36.8 C) 98.8 F (37.1 C)  TempSrc: Oral  Oral Oral  SpO2: 100%  98% 98%  Weight:  95.5 kg    Height:  5' 2 (1.575 m)     Physical Exam Vitals reviewed.  Constitutional:      General: She is not in acute distress.    Appearance: She is not ill-appearing or toxic-appearing.  Cardiovascular:     Rate and Rhythm: Normal rate and regular rhythm.     Heart sounds: No murmur heard. Pulmonary:     Effort: Pulmonary effort is normal. No respiratory distress.     Breath sounds: No wheezing, rhonchi or rales.  Abdominal:     General: There is no distension.     Palpations: Abdomen is soft.     Tenderness: There is abdominal tenderness. There is rebound.     Hernia: A hernia (soft ventral, reducible) is present.  Musculoskeletal:     Right lower leg: No edema.     Left lower leg: No edema.  Neurological:     Mental Status: She is alert.     Comments: A bit slow to respond Oriented to self, [name]  Psychiatric:        Mood and Affect: Mood normal.        Behavior: Behavior normal.     Data Reviewed: Basic metabolic panel notable for CO2 of 19 which appears stable Lipase within normal limits, LFTs were unremarkable on admission Troponins within normal limits Hemoglobin stable at 10.0, WBC normal Platelets stable but a bit low  Family Communication:   Disposition: Status is: Inpatient Remains inpatient appropriate because:  acute abdominal pain     Time spent: 35 minutes  Author: Toribio Door, MD 08/25/2023 10:09 AM  For on call review [email or web address].

## 2023-08-25 NOTE — Patient Outreach (Signed)
 Complex Care Management   Visit Note  08/25/2023  Name:  [name] MRN: [id number] DOB: 04-23-1955  Situation: Referral received for Complex Care Management related to assistance with getting Full Medicaid Benefits I obtained verbal consent from Caregiver Patient.  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 SW will make a referral to the walk in [place] clinic to assist patient    SDOH Interventions    Flowsheet Row Telephone from 08/17/2023 in [place] POPULATION HEALTH DEPARTMENT Patient Outreach from 06/09/2023 in [place] POPULATION HEALTH DEPARTMENT Telephone from 04/17/2023 in [place] POPULATION HEALTH DEPARTMENT ED to Hosp-Admission (Discharged) from 12/10/2022 in [place] LONG 4TH FLOOR PROGRESSIVE CARE AND UROLOGY  SDOH Interventions      Food Insecurity Interventions Intervention Not Indicated Intervention Not Indicated Intervention Not Indicated Intervention Not Indicated, Inpatient TOC  Housing Interventions Intervention Not Indicated  Intervention Not Indicated Intervention Not Indicated Intervention Not Indicated, Inpatient TOC  Transportation Interventions Intervention Not Indicated Intervention Not Indicated Intervention Not Indicated, Patient Resources (Friends/Family) Intervention Not Indicated, Inpatient TOC  Utilities Interventions Intervention Not Indicated Intervention Not Indicated Intervention Not Indicated Intervention Not Indicated, Inpatient TOC  Financial Strain Interventions -- Intervention Not Indicated -- --      Recommendation:   none  Follow Up Plan:   Telephone follow up appointment date/time:  09/08/2023 at 2:00 pm  Tobias [name], PhD [facility], [facility] Social Worker Direct Dial: [phone]  Fax: [phone]

## 2023-08-25 NOTE — Plan of Care (Signed)

## 2023-08-25 NOTE — Plan of Care (Signed)

## 2023-08-25 NOTE — Patient Instructions (Signed)
 Visit Information  Thank you for taking time to visit with me today. Please don't hesitate to contact me if I can be of assistance to you before our next scheduled appointment.  Your next care management appointment is by telephone on 09/08/2023 at 2:00 pm    Please call the care guide team at [phone] if you need to cancel, schedule, or reschedule an appointment.   Please call the Suicide and Crisis Lifeline: 988 go to [facility] Urgent [facility] [address] [place] [phone]) call 911 if you are experiencing a Mental Health or Behavioral Health Crisis or need someone to talk to.  Tobias [name], PhD [facility], [facility] Social Worker Direct Dial: [phone]  Fax: [phone]

## 2023-08-25 NOTE — Consult Note (Signed)
 Consult Note  Champagne Paletta [birth year]  [id number].    Requesting MD:  Dr. Jadine  Chief Complaint/Reason for Consult:  Abdominal pain  HPI:  [name] is a 68 year old female with PMH including Nash cirrhosis, recurrent hepatic encephalopathy, short gut syndrome, and CKD. Patient confused and having abdominal pain. Had CT which is listed below. General surgery was asked to consult.   Patient provides history but is not oriented to time or place. Accordingly, history is limited. Reports that she has generalized abdominal pain. Reports that she is having bowel movements without hematochezia. Having flatulence. Denies nausea or vomiting.  Reports that she has a ventral hernia that has had previous repair. Other surgical history includes wrist surgery, right liver resection, procedure for nephrolithiasis, IR kyphosis thoracic with bone biopsy, ileostomy and ileostomy closure, colonoscopy, cholecystectomy, and cesarean section with tubal ligation. Patient on medications for ongoing medical problems including but not limited to lactulose , levothyroxine , venlafaxine , midodrine  antibiotic therapy, requip .  Denies alcohol use, drug use, and current tobacco use.  Patient is a former cigarette smoker.  Patient has allergies to medications including ACE inhibitors, gadolinium derivatives, IVP dye, desitin, chlorhexidine , doxycycline, penicillins, and Zofran .  ROS: All negative with the exception of above.  Family History  Problem Relation Age of Onset   Uterine cancer Mother    Bone cancer Father    Heart disease Father    Hyperlipidemia Father    Dementia Father    Hypertension Father    Skin cancer Sister    Hypertension Sister    Diabetes Sister    Hyperlipidemia Brother    Hypertension Brother    Heart disease Brother     Past Medical History:  Diagnosis Date   Abdominal wall hernia 03/06/2020   Acute hepatic encephalopathy (HCC) 11/30/2022   Cerebral  atherosclerosis 08/20/2020   Chronic diarrhea    Cirrhosis, 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   pt stated on admission history review   Corneal perforation of left eye 04/18/2022   Depression, major, recurrent, moderate (HCC) 09/10/2018   DVT (deep venous thrombosis) (HCC) 01/10/1997   left      left     Heart murmur    Hepatic encephalopathy (HCC) 12/10/2022   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   Hyperammonemia (HCC) 09/30/2022   Hypertension    Kidney stone    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   Obesity    Pulmonary emboli (HCC) 01/10/2009   Pulmonary embolism (HCC)    Short gut syndrome    Thyroid  disease     Past Surgical History:  Procedure Laterality Date   ABDOMINAL WALL MESH  REMOVAL     2016   CESAREAN SECTION WITH BILATERAL TUBAL LIGATION  04/30/1985   CHOLECYSTECTOMY  05/2007   Exploratory laparotomy, lysis of adhesions, takedown of ileostomy with small bowel resection, open cholecystectomy and ileocolostomy   COLON SURGERY  01/2007   exploratory laparotomy with right colecotmy and end ileostomy   COLONOSCOPY  04/02/2019   Dr Gunnar Chris Daniels, MD [facility]   ESOPHAGOGASTRODUODENOSCOPY (EGD) WITH PROPOFOL  N/A 10/19/2022   Procedure: ESOPHAGOGASTRODUODENOSCOPY (EGD) WITH PROPOFOL ;  Surgeon: Saintclair Jasper, MD;  Location: [facility];  Service: Gastroenterology;  Laterality: N/A;   ESOPHAGOSCOPY  04/02/2019   EYE SURGERY Left 10/06/2020   Cataract extraciton w/intraocular lens implant- Dr Caresse   HERNIA REPAIR  10/2007   ILEOSTOMY  2009   states bowel perfotation wih colonsocopy   ILEOSTOMY CLOSURE  2009   INCISIONAL HERNIA REPAIR  10/11/2018   IR KYPHO THORACIC WITH BONE BIOPSY  08/31/2022   IR KYPHO THORACIC WITH BONE BIOPSY  10/27/2022   IR RADIOLOGIST EVAL & MGMT  09/27/2022   KIDNEY STONE SURGERY     LIVER RESECTION     2009   PANNICULECTOMY     repair of abdominal wall hernia   WRIST SURGERY     tendonitis     Social History:  reports that she quit smoking about 38 years ago. Her smoking use included cigarettes. She has been exposed to tobacco smoke. She has never used smokeless tobacco. She reports that she does not drink alcohol and does not use drugs.  Allergies:  Allergies  Allergen Reactions   Ace Inhibitors Anaphylaxis and Swelling   Gadolinium Derivatives Anaphylaxis   Ivp Dye [Iodinated Contrast Media] Anaphylaxis   Wound Dressing Adhesive Dermatitis   Desitin [Zinc  Oxide] Rash and Other (See Comments)    Worsens rash   Yellow Jacket Venom Swelling   Chlorhexidine  Rash and Other (See Comments)    WORSENS RASHES   Doxycycline Rash   Penicillins Dermatitis and Rash   Zofran  [Ondansetron ] Rash    Medications Prior to Admission  Medication Sig Dispense Refill   albuterol  (VENTOLIN  HFA) 108 (90 Base) MCG/ACT inhaler Inhale 2 puffs into the lungs every 6 (six) hours as needed for shortness of breath or wheezing. (Patient not taking: Reported on 08/17/2023)     atorvastatin  (LIPITOR) 40 MG tablet Take 1 tablet (40 mg total) by mouth in the morning. 90 tablet 1   copper  tablet Take 2 mg by mouth in the morning and at bedtime.     cyanocobalamin  (VITAMIN B12) 1000 MCG tablet Take 2,000 mcg by mouth every Monday, Wednesday, and Friday.     dicyclomine  (BENTYL ) 10 MG capsule Take 1 capsule (10 mg total) by mouth 3 (three) times daily as needed for spasms (abdominal cramping). 90 capsule 3   EPINEPHrine  0.3 mg/0.3 mL IJ SOAJ injection Inject 0.3 mg into the muscle as needed for anaphylaxis. (Patient not taking: Reported on 08/17/2023) 1 each 1   famotidine  (PEPCID ) 20 MG tablet Take 20 mg by mouth daily as needed for heartburn or indigestion.     folic acid  (FOLVITE ) 1 MG tablet Take 1 tablet (1 mg total) by mouth daily. 30 tablet 0   furosemide  (LASIX ) 40 MG tablet Take 1 tablet (40 mg total) by mouth 2 (two) times daily. 90 tablet 0   HYDROcodone -acetaminophen  (NORCO) 10-325 MG tablet Take 1  tablet by mouth every 8 (eight) hours as needed for moderate pain (pain score 4-6).     IRON -VITAMIN C  PO Take 1 tablet by mouth daily with breakfast.     lactulose  (CHRONULAC ) 10 GM/15ML solution Take 45 mLs (30 g total) by mouth 3 (three) times daily. Take to ensure that there is 2 loose bowel movement EVERY DAY (Patient taking differently: Take 30 g by mouth 2 (two) times daily.) 946 mL 4   levothyroxine  (SYNTHROID ) 175 MCG tablet Take 1 tablet (175 mcg total) by mouth daily at 6 (six) AM. Recheck TSH with PCP in 4 weeks 90 tablet 0   lidocaine  (LIDODERM ) 5 % Place 1 patch onto the skin daily. Remove & Discard patch within 12 hours or as directed by MD 30 patch 0   magnesium  gluconate (MAGONATE) 500 (27 Mg) MG TABS tablet Take 1 tablet (500 mg total) by mouth in the morning and at bedtime. 30  tablet 1   meclizine  (ANTIVERT ) 25 MG tablet Take 1 tablet (25 mg total) by mouth 3 (three) times daily as needed for dizziness. 30 tablet 0   megestrol  (MEGACE ) 40 MG tablet Take 1 tablet (40 mg total) by mouth 2 (two) times daily. 60 tablet 0   methocarbamol  (ROBAXIN ) 500 MG tablet Take 500 mg by mouth in the morning.     midodrine  (PROAMATINE ) 10 MG tablet TAKE 1 TABLET(10 MG) BY MOUTH THREE TIMES DAILY WITH MEALS 90 tablet 3   Multiple Vitamins-Minerals (CENTRUM SILVER ULTRA WOMENS PO) Take 1 tablet by mouth in the morning.     naloxone (NARCAN) nasal spray 4 mg/0.1 mL Place 1 spray into the nose as needed (accidental overdose). (Patient not taking: Reported on 08/17/2023)     nystatin  cream (MYCOSTATIN ) Apply 1 Application topically 2 (two) times daily as needed for dry skin.     pantoprazole  (PROTONIX ) 40 MG tablet Take 1 tablet (40 mg total) by mouth 2 (two) times daily before a meal. 180 tablet 2   potassium chloride  (KLOR-CON ) 10 MEQ tablet Take 2 tablets (20 mEq total) by mouth 2 (two) times daily. 90 tablet 0   rifaximin  (XIFAXAN ) 550 MG TABS tablet Take 550 mg by mouth 2 (two) times daily.      rOPINIRole  (REQUIP ) 0.25 MG tablet TAKE 1 TABLET(0.25 MG) BY MOUTH AT BEDTIME 90 tablet 1   senna-docusate (SENOKOT-S) 8.6-50 MG tablet TAKE 1 TABLET BY MOUTH TWICE DAILY 30 tablet 0   spironolactone  (ALDACTONE ) 50 MG tablet TAKE 1 TABLET(50 MG) BY MOUTH DAILY 30 tablet 0   triamcinolone  cream (KENALOG ) 0.1 % Apply 1 Application topically 2 (two) times daily as needed (for itching- affected areas).     venlafaxine  XR (EFFEXOR -XR) 75 MG 24 hr capsule Take 1 capsule (75 mg total) by mouth daily with breakfast. 90 capsule 0   Vitamin D , Ergocalciferol , (DRISDOL) 50000 UNITS CAPS Take 50,000 Units by mouth every Wednesday.      Blood pressure (!) 122/56, pulse 79, temperature 98.8 F (37.1 C), temperature source Oral, resp. rate 20, height 5' 2 (1.575 m), weight 95.5 kg, SpO2 98%. Physical Exam:  General: Pleasant, female who is laying in bed in NAD HEENT: Head is normocephalic, atraumatic.   Lungs: Respiratory effort nonlabored Abd: Soft and ND. Generalized tenderness with palpation. No guarding or rebound tenderness.  Psych: Recalls her name. Not oriented to time or place. Responds appropriately to questions.   Results for orders placed or performed during the hospital encounter of 08/24/23 (from the past 48 hours)  Comprehensive metabolic panel     Status: Abnormal   Collection Time: 08/24/23  3:44 PM  Result Value Ref Range   Sodium 136 135 - 145 mmol/L   Potassium 3.7 3.5 - 5.1 mmol/L   Chloride 114 (H) 98 - 111 mmol/L   CO2 15 (L) 22 - 32 mmol/L   Glucose, Bld 131 (H) 70 - 99 mg/dL    Comment: Glucose reference range applies only to samples taken after fasting for at least 8 hours.   BUN 35 (H) 8 - 23 mg/dL   Creatinine, Ser 9.03 0.44 - 1.00 mg/dL   Calcium  8.4 (L) 8.9 - 10.3 mg/dL   Total Protein 6.0 (L) 6.5 - 8.1 g/dL   Albumin  2.7 (L) 3.5 - 5.0 g/dL   AST 25 15 - 41 U/L   ALT 20 0 - 44 U/L   Alkaline Phosphatase 112 38 - 126 U/L   Total  Bilirubin 1.6 (H) 0.0 - 1.2 mg/dL    GFR, Estimated >39 >39 mL/min    Comment: (NOTE) Calculated using the CKD-EPI Creatinine Equation (2021)    Anion gap 7 5 - 15    Comment: Performed at [facility], [address], [place], [place] [address]  CBC     Status: Abnormal   Collection Time: 08/24/23  3:44 PM  Result Value Ref Range   WBC 6.3 4.0 - 10.5 K/uL   RBC 3.58 (L) 3.87 - 5.11 MIL/uL   Hemoglobin 10.9 (L) 12.0 - 15.0 g/dL   HCT 63.9 63.9 - 53.9 %   MCV 100.6 (H) 80.0 - 100.0 fL   MCH 30.4 26.0 - 34.0 pg   MCHC 30.3 30.0 - 36.0 g/dL   RDW 84.1 (H) 88.4 - 84.4 %   Platelets 134 (L) 150 - 400 K/uL   nRBC 0.0 0.0 - 0.2 %    Comment: Performed at [facility], [address] [place], [place] [address]  Urinalysis, Routine w reflex microscopic -Urine, Clean Catch     Status: Abnormal   Collection Time: 08/24/23  3:44 PM  Result Value Ref Range   Color, Urine YELLOW YELLOW   APPearance CLEAR CLEAR   Specific Gravity, Urine 1.018 1.005 - 1.030   pH 6.0 5.0 - 8.0   Glucose, UA NEGATIVE NEGATIVE mg/dL   Hgb urine dipstick LARGE (A) NEGATIVE   Bilirubin Urine NEGATIVE NEGATIVE   Ketones, ur NEGATIVE NEGATIVE mg/dL   Protein, ur NEGATIVE NEGATIVE mg/dL   Nitrite NEGATIVE NEGATIVE   Leukocytes,Ua NEGATIVE NEGATIVE   RBC / HPF 0-5 0 - 5 RBC/hpf   WBC, UA 0-5 0 - 5 WBC/hpf   Bacteria, UA RARE (A) NONE SEEN   Squamous Epithelial / HPF 0-5 0 - 5 /HPF   Mucus PRESENT     Comment: Performed at [facility], [address] [place], [place] [address]  Brain natriuretic peptide     Status: None   Collection Time: 08/24/23  3:44 PM  Result Value Ref Range   B Natriuretic Peptide 72.9 0.0 - 100.0 pg/mL    Comment: Performed at [facility], [address] [place], [place] [address]  Resp panel by RT-PCR (RSV, Flu A&B, Covid) Urine, Clean Catch     Status: None   Collection Time: 08/24/23  5:07 PM   Specimen: Urine, Clean Catch; Nasal Swab   Result Value Ref Range   SARS Coronavirus 2 by RT PCR NEGATIVE NEGATIVE    Comment: (NOTE) SARS-CoV-2 target nucleic acids are NOT DETECTED.  The SARS-CoV-2 RNA is generally detectable in upper respiratory specimens during the acute phase of infection. The lowest concentration of SARS-CoV-2 viral copies this assay can detect is 138 copies/mL. A negative result does not preclude SARS-Cov-2 infection and should not be used as the sole basis for treatment or other patient management decisions. A negative result may occur with  improper specimen collection/handling, submission of specimen other than nasopharyngeal swab, presence of viral mutation(s) within the areas targeted by this assay, and inadequate number of viral copies(<138 copies/mL). A negative result must be combined with clinical observations, patient history, and epidemiological information. The expected result is Negative.  Fact Sheet for Patients:  BloggerCourse.com  Fact Sheet for Healthcare Providers:  SeriousBroker.it  This test is no t yet approved or cleared by the [place]  FDA and  has been authorized for detection and/or diagnosis of SARS-CoV-2 by FDA  under an Emergency Use Authorization (EUA). This EUA will remain  in effect (meaning this test can be used) for the duration of the COVID-19 declaration under Section 564(b)(1) of the Act, 21 U.S.C.section 360bbb-3(b)(1), unless the authorization is terminated  or revoked sooner.       Influenza A by PCR NEGATIVE NEGATIVE   Influenza B by PCR NEGATIVE NEGATIVE    Comment: (NOTE) The Xpert Xpress SARS-CoV-2/FLU/RSV plus assay is intended as an aid in the diagnosis of influenza from Nasopharyngeal swab specimens and should not be used as a sole basis for treatment. Nasal washings and aspirates are unacceptable for Xpert Xpress SARS-CoV-2/FLU/RSV testing.  Fact Sheet for  Patients: BloggerCourse.com  Fact Sheet for Healthcare Providers: SeriousBroker.it  This test is not yet approved or cleared by the [place]  FDA and has been authorized for detection and/or diagnosis of SARS-CoV-2 by FDA under an Emergency Use Authorization (EUA). This EUA will remain in effect (meaning this test can be used) for the duration of the COVID-19 declaration under Section 564(b)(1) of the Act, 21 U.S.C. section 360bbb-3(b)(1), unless the authorization is terminated or revoked.     Resp Syncytial Virus by PCR NEGATIVE NEGATIVE    Comment: (NOTE) Fact Sheet for Patients: BloggerCourse.com  Fact Sheet for Healthcare Providers: SeriousBroker.it  This test is not yet approved or cleared by the [place]  FDA and has been authorized for detection and/or diagnosis of SARS-CoV-2 by FDA under an Emergency Use Authorization (EUA). This EUA will remain in effect (meaning this test can be used) for the duration of the COVID-19 declaration under Section 564(b)(1) of the Act, 21 U.S.C. section 360bbb-3(b)(1), unless the authorization is terminated or revoked.  Performed at [facility], [address], [place], [place] [address]   Lipase, blood     Status: None   Collection Time: 08/24/23  5:58 PM  Result Value Ref Range   Lipase 44 11 - 51 U/L    Comment: Performed at [facility], [address], [place], [place] [address]  Troponin I (High Sensitivity)     Status: None   Collection Time: 08/24/23  5:58 PM  Result Value Ref Range   Troponin I (High Sensitivity) 11 <18 ng/L    Comment: (NOTE) Elevated high sensitivity troponin I (hsTnI) values and significant  changes across serial measurements may suggest ACS but many other  chronic and acute conditions are known to elevate hsTnI results.  Refer to the Links section for chest  pain algorithms and additional  guidance. Performed at [facility], [address], [place], [place] [address]   Blood gas, venous     Status: Abnormal   Collection Time: 08/24/23  5:58 PM  Result Value Ref Range   pH, Ven 7.35 7.25 - 7.43   pCO2, Ven 32 (L) 44 - 60 mmHg   pO2, Ven 51 (H) 32 - 45 mmHg   Bicarbonate 17.7 (L) 20.0 - 28.0 mmol/L   Acid-base deficit 6.8 (H) 0.0 - 2.0 mmol/L   O2 Saturation 83.6 %   Patient temperature 37.0     Comment: Performed at [facility], [address] [place], [place] [address]  Ammonia     Status: Abnormal   Collection Time: 08/24/23  6:05 PM  Result Value Ref Range   Ammonia 117 (H) 9 - 35 umol/L    Comment: Performed at [facility], [address] [place], [place] [address]  Troponin I (High Sensitivity)  Status: None   Collection Time: 08/25/23 12:36 AM  Result Value Ref Range   Troponin I (High Sensitivity) 14 <18 ng/L    Comment: (NOTE) Elevated high sensitivity troponin I (hsTnI) values and significant  changes across serial measurements may suggest ACS but many other  chronic and acute conditions are known to elevate hsTnI results.  Refer to the Links section for chest pain algorithms and additional  guidance. Performed at [facility], [address], [place], [place] [address]   CBC     Status: Abnormal   Collection Time: 08/25/23 12:36 AM  Result Value Ref Range   WBC 5.2 4.0 - 10.5 K/uL   RBC 3.23 (L) 3.87 - 5.11 MIL/uL   Hemoglobin 10.0 (L) 12.0 - 15.0 g/dL   HCT 68.6 (L) 63.9 - 53.9 %   MCV 96.9 80.0 - 100.0 fL   MCH 31.0 26.0 - 34.0 pg   MCHC 31.9 30.0 - 36.0 g/dL   RDW 84.1 (H) 88.4 - 84.4 %   Platelets 112 (L) 150 - 400 K/uL   nRBC 0.0 0.0 - 0.2 %    Comment: Performed at [facility], [address], [place], [place] [address]  Basic metabolic panel     Status: Abnormal   Collection Time: 08/25/23 12:36 AM   Result Value Ref Range   Sodium 139 135 - 145 mmol/L   Potassium 3.5 3.5 - 5.1 mmol/L   Chloride 114 (H) 98 - 111 mmol/L   CO2 19 (L) 22 - 32 mmol/L   Glucose, Bld 122 (H) 70 - 99 mg/dL    Comment: Glucose reference range applies only to samples taken after fasting for at least 8 hours.   BUN 32 (H) 8 - 23 mg/dL   Creatinine, Ser 9.03 0.44 - 1.00 mg/dL   Calcium  8.2 (L) 8.9 - 10.3 mg/dL   GFR, Estimated >39 >39 mL/min    Comment: (NOTE) Calculated using the CKD-EPI Creatinine Equation (2021)    Anion gap 6 5 - 15    Comment: Performed at [facility], [address], [place], [place] [address]  Ammonia     Status: Abnormal   Collection Time: 08/25/23 12:36 AM  Result Value Ref Range   Ammonia 115 (H) 9 - 35 umol/L    Comment: Performed at [facility], [address], [place], [place] [address]  Uric acid     Status: Abnormal   Collection Time: 08/25/23 12:36 AM  Result Value Ref Range   Uric Acid, Serum 9.5 (H) 2.5 - 7.1 mg/dL    Comment: Performed at [facility], [address], [place], [place] [address]  Ammonia     Status: Abnormal   Collection Time: 08/25/23 11:04 AM  Result Value Ref Range   Ammonia 107 (H) 9 - 35 umol/L    Comment: Performed at [facility], [address], [place], [place] [address]   CT ABDOMEN PELVIS WO CONTRAST Result Date: 08/24/2023 CLINICAL DATA:  Acute abdominal pain. EXAM: CT ABDOMEN AND PELVIS WITHOUT CONTRAST TECHNIQUE: Multidetector CT imaging of the abdomen and pelvis was performed following the standard protocol without IV contrast. RADIATION DOSE REDUCTION: This exam was performed according to the departmental dose-optimization program which includes automated exposure control, adjustment of the mA and/or kV according to patient size and/or use of iterative reconstruction technique. COMPARISON:  CT abdomen and pelvis 08/11/2023. FINDINGS: Lower chest: 4 mm left lower  lobe nodule  seen on image 7/23. Hepatobiliary: No focal liver abnormality is seen. Nodular liver contour again noted. Status post cholecystectomy. No biliary dilatation. Pancreas: Unremarkable. No pancreatic ductal dilatation or surrounding inflammatory changes. Spleen: Mildly enlarged, unchanged. Adrenals/Urinary Tract: There are punctate nonobstructing bilateral renal calculi. There is no hydronephrosis or perinephric fluid. The adrenal glands and bladder are within normal limits. Stomach/Bowel: No postsurgical changes in the right colon. There is no bowel obstruction, pneumatosis or free air. There is questionable descending and sigmoid colon wall thickening versus normal under distension. There is no surrounding inflammation. The appendix is not seen. Small bowel loops are unremarkable, but there is central mesenteric edema diffusely. The stomach is within normal limits. Vascular/Lymphatic: Aorta and IVC are normal in size. There are atherosclerotic calcifications of the aorta and branch vessels. Varices are seen extending along the left retroperitoneum and in the splenic lesions as well as in the central mesentery similar to prior. Reproductive: The uterus is enlarged and contains multiple calcified fibroids measuring up to 5.8 cm, unchanged. Adnexa are unremarkable. Other: There is new presacral edema. There is no ascites. There is new mild body wall edema. There is bulging of the anterior abdominal wall similar to the prior study. There is a small ventral hernia left of midline in the lower abdominal wall containing mesenteric fat and a small amount of fluid similar to the prior study. Musculoskeletal: The bones are diffusely osteopenic. Degenerative changes affect the spine. T12 vertebroplasty changes are present. IMPRESSION: 1. New diffuse central mesenteric edema, nonspecific. Findings may be related to small bowel pathology or infectious/inflammatory process. 2. New presacral edema and body wall edema. 3.  Questionable descending and sigmoid colon wall thickening versus normal under distension. Correlate clinically for colitis. 4. Stable findings of cirrhosis and portal hypertension. 5. Nonobstructing bilateral renal calculi. 6. Stable left lower lobe pulmonary nodule measuring 4 mm. 7. Stable small ventral hernia containing mesenteric fat and a small amount of fluid. 8. Stable fibroid uterus. Aortic Atherosclerosis (ICD10-I70.0). Electronically Signed   By: Greig Pique M.D.   On: 08/24/2023 23:15   CT HEAD WO CONTRAST ( ) Result Date: 08/24/2023 CLINICAL DATA:  Delirium EXAM: CT HEAD WITHOUT CONTRAST TECHNIQUE: Contiguous axial images were obtained from the base of the skull through the vertex without intravenous contrast. RADIATION DOSE REDUCTION: This exam was performed according to the departmental dose-optimization program which includes automated exposure control, adjustment of the mA and/or kV according to patient size and/or use of iterative reconstruction technique. COMPARISON:  None Available. FINDINGS: Brain: Normal anatomic configuration. Parenchymal volume loss is commensurate with the patient's age. Moderate periventricular white matter changes are present likely reflecting the sequela of small vessel ischemia. No abnormal intra or extra-axial mass lesion or fluid collection. No abnormal mass effect or midline shift. No evidence of acute intracranial hemorrhage or infarct. Ventricular size is normal. Cerebellum unremarkable. Vascular: No asymmetric hyperdense vasculature at the skull base. Skull: Intact Sinuses/Orbits: Paranasal sinuses are clear. Orbits are unremarkable. Other: Mastoid air cells and middle ear cavities are clear. IMPRESSION: 1. No acute intracranial abnormality. 2. Moderate senescent change.  A 3. Electronically Signed   By: Dorethia Molt M.D.   On: 08/24/2023 23:12   DG Chest 1 View Result Date: 08/24/2023 CLINICAL DATA:  Shortness of breath. Altered mental status.  Ex-smoker. EXAM: CHEST  1 VIEW COMPARISON:  08/08/2023 FINDINGS: Stable poor inspiration, borderline enlarged cardiac silhouette and tortuous and partially calcified thoracic aorta. Clear lungs with normal vascularity. Unremarkable bones. IMPRESSION: No acute abnormality.  Electronically Signed   By: Elspeth Bathe M.D.   On: 08/24/2023 16:49      Assessment/Plan Generalized abdominal pain -CT ab/pelv wo contrast from 8/14 showed new diffuse central mesenteric edema, nonspecific. Findings may be related to small bowel pathology or infectious/inflammatory process. New presacral edema and body wall edema. Questionable descending and sigmoid colon wall thickening versus normal under distension. Stable findings of cirrhosis and portal hypertension. Stable small ventral hernia containing mesenteric fat and a small amount of fluid.  -Ammonia 107 -Uric acid 9.5 -WBC 5.2 -Discussed imaging, labs, and history with patient. Also reviewed with MD. No surgical intervention recommended at this time. Patient at high risk of complications with surgical any procedure.   FEN: NPO  VTE: SCDs ID: [email or web address]    I reviewed specialist notes, hospitalist notes, last 24 h vitals and pain scores, last 48 h intake and output, last 24 h labs and trends, and last 24 h imaging results.  This care required high  level of medical decision making.   Marjorie Carlyon Favre, [facility] 08/25/2023, 12:21 PM Please see Amion for pager number during day hours 7:00am-4:30pm

## 2023-08-25 NOTE — TOC Initial Note (Signed)
 Transition of Care [facility]) - Initial/Assessment Note   Patient Details  Name: [name] MRN: [id number] Date of Birth: [birth year]  [facility]) CM/SW Contact:    Duwaine GORMAN Aran, LCSW Phone Number: 08/25/2023, 11:24 AM  Clinical Narrative: Patient is from home with son. Patient has the needed DME at home. Son reported the patient is no longer active with [facility] services and he is working on possibly getting the patient OPPT. Care management following for possible discharge needs.  Expected Discharge Plan: Home/Self Care Barriers to Discharge: Continued Medical Work up  Patient Goals and CMS Choice Patient states their goals for this hospitalization and ongoing recovery are:: Home Choice offered to / list presented to : NA  Expected Discharge Plan and Services In-house Referral: Clinical Social Work Post Acute Care Choice: NA Living arrangements for the past 2 months: Single Family Home           DME Arranged: N/A DME Agency: NA  Prior Living Arrangements/Services Living arrangements for the past 2 months: Single Family Home Lives with:: Adult Children Patient language and need for interpreter reviewed:: Yes Do you feel safe going back to the place where you live?: Yes      Need for Family Participation in Patient Care: No (Comment) Care giver support system in place?: Yes (comment) Current home services: DME (Walker, rollator, wheelchair, shower chair, hospital bed, hoyer lift) Criminal Activity/Legal Involvement Pertinent to Current Situation/Hospitalization: No - Comment as needed  Activities of Daily Living ADL Screening (condition at time of admission) Independently performs ADLs?: Yes (appropriate for developmental age) Is the patient deaf or have difficulty hearing?: No Does the patient have difficulty seeing, even when wearing glasses/contacts?: No Does the patient have difficulty concentrating, remembering, or making decisions?: No  Emotional  Assessment Orientation: : Oriented to Self, Oriented to  Time, Oriented to Situation Alcohol / Substance Use: Not Applicable Psych Involvement: No (comment)  Admission diagnosis:  Hepatic encephalopathy (HCC) [K76.82] History of cirrhosis [Z87.19] Acute hepatic encephalopathy (HCC) [K76.82] Patient Active Problem List   Diagnosis Date Noted   Cough 08/24/2023   Intertrigo 08/24/2023   Hepatic encephalopathy (HCC) 06/29/2023   Chronic kidney disease, stage 3a (HCC) 06/28/2023   Hyperammonemia (HCC) 05/23/2023   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) 05/23/2023   Chronic idiopathic thrombocytopenia (HCC) 05/23/2023   Acute abdominal pain 05/23/2023   Hyperlipidemia 05/23/2023   Generalized anxiety disorder 05/23/2023   Chronic anemia 05/23/2023   Acute metabolic encephalopathy 05/23/2023   Copper  deficiency 04/24/2023   Cellulitis of right lower extremity 04/06/2023   ARF (acute renal failure) (HCC) 03/30/2023   Depression 03/30/2023   Restless leg syndrome 03/30/2023   Pruritus 03/06/2023   Edema of both lower legs 03/06/2023   Insomnia 03/06/2023   Compression fracture of body of thoracic vertebra (HCC) 03/06/2023   Anemia of chronic disease 02/22/2023   Lumbar radiculopathy 12/10/2022   Grade I diastolic dysfunction 12/10/2022   History of DVT (deep vein thrombosis) 12/01/2022   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   Acute hepatic encephalopathy (HCC) 10/17/2022   Hypomagnesemia 10/08/2022   DNR (do not resuscitate)/DNI(Do Not intubate) 10/05/2022   Vaginal bleeding 10/05/2022   Physical deconditioning 08/17/2022   Decompensated cirrhosis (HCC) 08/17/2022   Chronic hypotension 08/11/2022   Unilateral primary osteoarthritis, left knee 08/01/2022   Folate deficiency 06/14/2022   Systolic murmur 06/12/2022   Iron  deficiency anemia 05/16/2022   Thrombocytopenia (HCC) - Baseline 100-130K 05/16/2022   History of  eye surgery 05/01/2022   Corneal abnormality  04/29/2022   Status post eye surgery 04/29/2022   Corneal perforation of left eye 04/18/2022   Pseudophakia of left eye 02/08/2022   Physical debility 12/10/2021   Liver cirrhosis secondary to NASH (HCC) 10/03/2021   Polypharmacy 06/22/2021   Former smoker 01/06/2021   Combined form of age-related cataract, right eye 10/01/2020   Corneal scarring 09/10/2020   Cerebral atherosclerosis 08/20/2020   Short gut syndrome 08/20/2020   DDD (degenerative disc disease), lumbar 08/20/2020   Abdominal wall hernia 03/06/2020   Splenomegaly 02/06/2019   Vitamin B12 deficiency 01/21/2019   Hyperuricemia 08/25/2016   GERD (gastroesophageal reflux disease) 07/25/2016   Hypothyroidism 07/25/2016   Microcytic anemia 04/12/2016   Vitamin D  deficiency 05/04/2015   History of adenomatous polyp of colon 04/28/2015   H/O angioedema 11/15/2014   Chronic diarrhea 06/01/2014   PCP:  [name], MD Pharmacy:   [facility] [phone] - [place], [place] - [address] AT [facility] [address] [place] [address] Phone: [phone] Fax: [phone]  Social Drivers of Health (SDOH) Social History: SDOH Screenings   Food Insecurity: No Food Insecurity (08/25/2023)  Housing: Low Risk  (08/25/2023)  Transportation Needs: No Transportation Needs (08/25/2023)  Utilities: Not At Risk (08/25/2023)  Depression (PHQ2-9): Medium Risk (07/24/2023)  Financial Resource Strain: Low Risk  (06/09/2023)  Social Connections: Unknown (08/25/2023)  Tobacco Use: Medium Risk (08/24/2023)   SDOH Interventions:    Readmission Risk Interventions    08/25/2023   10:04 AM 08/09/2023   12:37 PM 04/07/2023    1:23 PM  Readmission Risk Prevention Plan  Transportation Screening Complete  Complete  Medication Review Oceanographer) Complete Complete Complete  PCP or Specialist appointment within 3-5 days of discharge  Complete   HRI or Home Care Consult Complete Complete Complete  SW Recovery Care/Counseling  Consult Complete Complete Complete  Palliative Care Screening Not Applicable Not Applicable Not Applicable  Skilled Nursing Facility Not Applicable Not Applicable Not Applicable

## 2023-08-25 NOTE — Plan of Care (Signed)
  Problem: Education: Goal: Knowledge of General Education information will improve Description: Including pain rating scale, medication(s)/side effects and non-pharmacologic comfort measures 08/25/2023 2348 by Waneta Marylynn SAUNDERS, RN Outcome: Progressing 08/25/2023 2345 by Waneta Marylynn SAUNDERS, RN Outcome: Progressing   Problem: Health Behavior/Discharge Planning: Goal: Ability to manage health-related needs will improve 08/25/2023 2348 by Waneta Marylynn SAUNDERS, RN Outcome: Progressing 08/25/2023 2345 by Waneta Marylynn SAUNDERS, RN Outcome: Progressing   Problem: Clinical Measurements: Goal: Ability to maintain clinical measurements within normal limits will improve 08/25/2023 2348 by Waneta Marylynn SAUNDERS, RN Outcome: Progressing 08/25/2023 2345 by Waneta Marylynn SAUNDERS, RN Outcome: Progressing Goal: Will remain free from infection Outcome: Progressing

## 2023-08-25 NOTE — Plan of Care (Signed)

## 2023-08-25 NOTE — Hospital Course (Addendum)
 68 year old woman PMH including Nash cirrhosis, recurrent hepatic encephalopathy, short gut syndrome, CKD presented with confusion.  Consultants General surgery  Procedures/Events 8/14 admit for acute hepatic encephalopathy

## 2023-08-26 DIAGNOSIS — D696 Thrombocytopenia, unspecified: Secondary | ICD-10-CM | POA: Diagnosis not present

## 2023-08-26 DIAGNOSIS — K7581 Nonalcoholic steatohepatitis (NASH): Secondary | ICD-10-CM | POA: Diagnosis not present

## 2023-08-26 DIAGNOSIS — K7682 Hepatic encephalopathy: Secondary | ICD-10-CM | POA: Diagnosis not present

## 2023-08-26 DIAGNOSIS — K7460 Unspecified cirrhosis of liver: Secondary | ICD-10-CM | POA: Diagnosis not present

## 2023-08-26 LAB — COMPREHENSIVE METABOLIC PANEL WITH GFR
ALT: 44 U/L (ref 0–44)
AST: 65 U/L — ABNORMAL HIGH (ref 15–41)
Albumin: 2.4 g/dL — ABNORMAL LOW (ref 3.5–5.0)
Alkaline Phosphatase: 116 U/L (ref 38–126)
Anion gap: 5 (ref 5–15)
BUN: 29 mg/dL — ABNORMAL HIGH (ref 8–23)
CO2: 20 mmol/L — ABNORMAL LOW (ref 22–32)
Calcium: 8.5 mg/dL — ABNORMAL LOW (ref 8.9–10.3)
Chloride: 115 mmol/L — ABNORMAL HIGH (ref 98–111)
Creatinine, Ser: 1.04 mg/dL — ABNORMAL HIGH (ref 0.44–1.00)
GFR, Estimated: 60 mL/min — ABNORMAL LOW (ref >60)
Glucose, Bld: 114 mg/dL — ABNORMAL HIGH (ref 70–99)
Potassium: 4 mmol/L (ref 3.5–5.1)
Sodium: 140 mmol/L (ref 135–145)
Total Bilirubin: 1.4 mg/dL — ABNORMAL HIGH (ref 0.0–1.2)
Total Protein: 5.4 g/dL — ABNORMAL LOW (ref 6.5–8.1)

## 2023-08-26 LAB — CBC
HCT: 34.6 % — ABNORMAL LOW (ref 36.0–46.0)
Hemoglobin: 10.6 g/dL — ABNORMAL LOW (ref 12.0–15.0)
MCH: 30.5 pg (ref 26.0–34.0)
MCHC: 30.6 g/dL (ref 30.0–36.0)
MCV: 99.4 fL (ref 80.0–100.0)
Platelets: 131 K/uL — ABNORMAL LOW (ref 150–400)
RBC: 3.48 MIL/uL — ABNORMAL LOW (ref 3.87–5.11)
RDW: 16.1 % — ABNORMAL HIGH (ref 11.5–15.5)
WBC: 5.2 K/uL (ref 4.0–10.5)
nRBC: 0 % (ref 0.0–0.2)

## 2023-08-26 IMAGING — DX DG CHEST 1V PORT
1 series · 1 of 1 positions shown · non-contrast
Comparison: 02/09/2021, 01/19/2019

CLINICAL DATA: Shortness of breath

EXAM:
PORTABLE CHEST 1 VIEW

[chest ap]
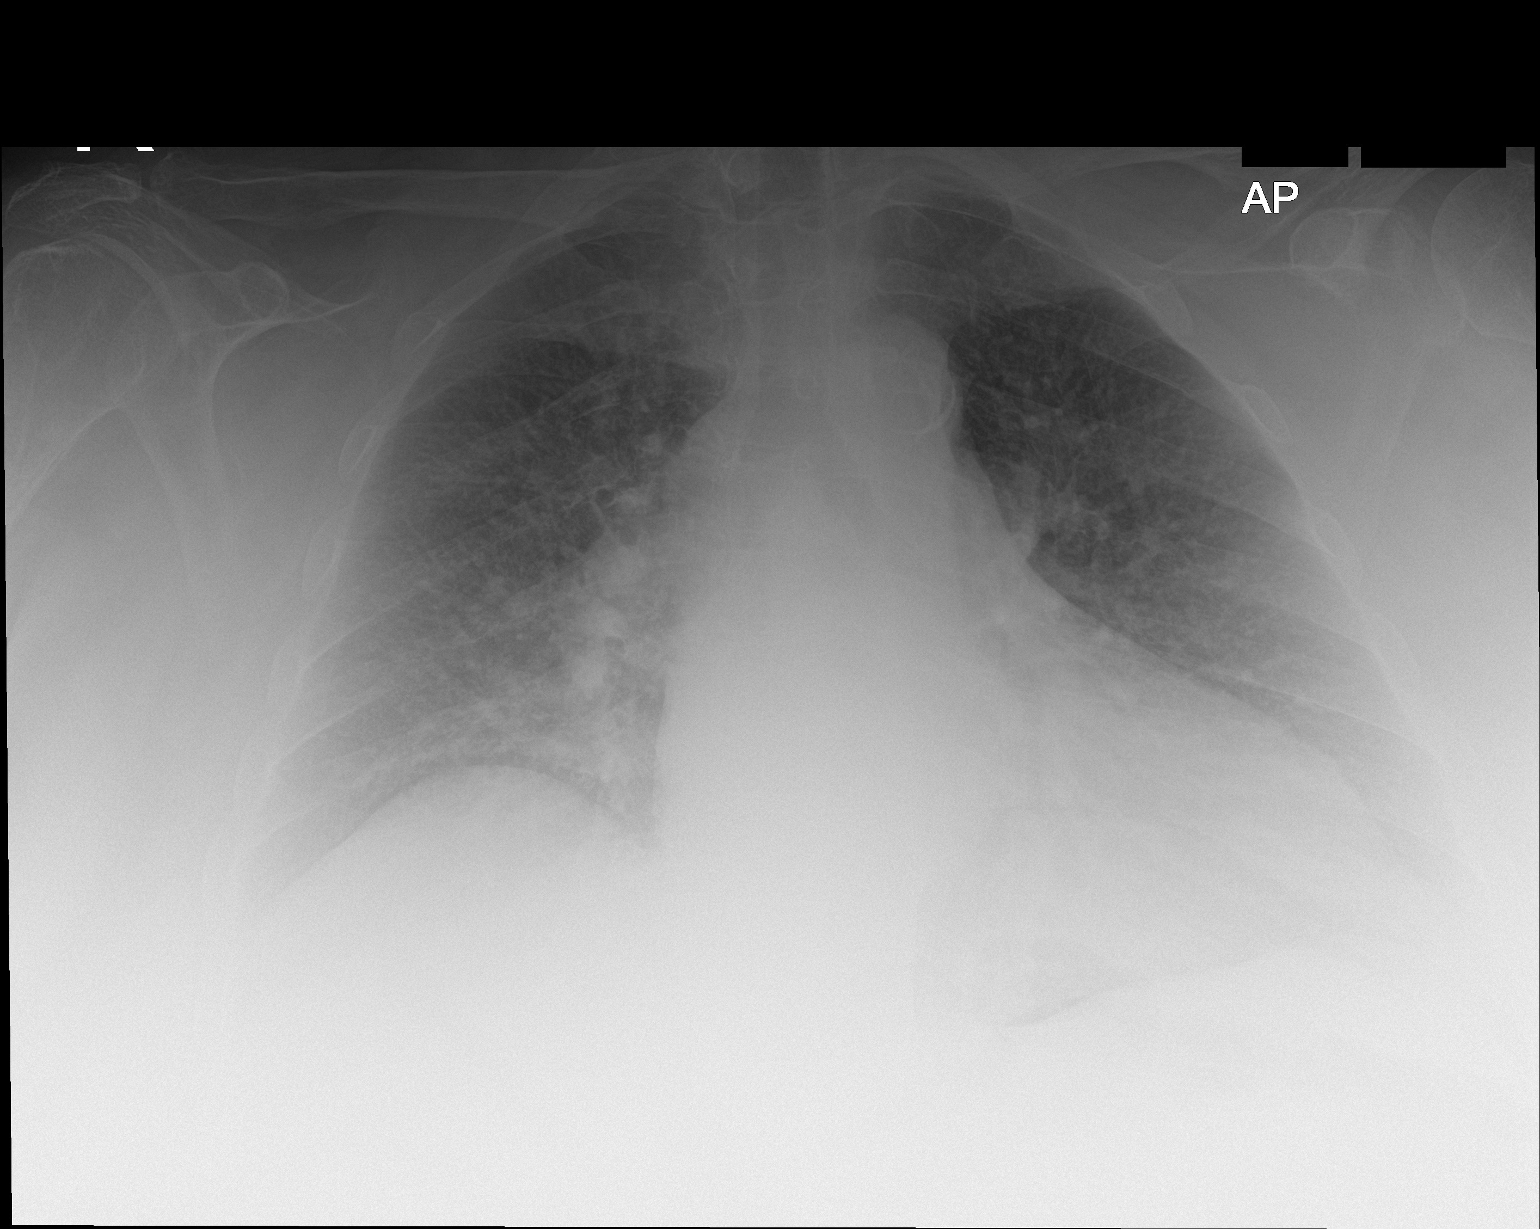

[1 of 1 positions shown; findings below may reference images not displayed]

FINDINGS: Cardiomegaly with central vascular congestion. No consolidation or
pleural effusion. Aortic atherosclerosis. No pneumothorax.
IMPRESSION: Mild cardiomegaly with slight central congestion

## 2023-08-26 NOTE — Progress Notes (Signed)
  Progress Note   Patient: [name] FMW:[id number] DOB: [birth year] DOA: 08/24/2023     2 DOS: the patient was seen and examined on 08/26/2023   Brief hospital course: 68 year old woman PMH including Nash cirrhosis, recurrent hepatic encephalopathy, short gut syndrome, CKD presented with confusion.  Consultants General surgery  Procedures/Events 8/14 admit for acute hepatic encephalopathy  Assessment and Plan: Recurrent hepatic encephalopathy Decompensated NASH cirrhosis with associated thrombocytopenia, hyperbilirubinemia Reportedly compliant with lactulose  Serum ammonia significantly high on admission.  Ninth hospitalization this year. Previous pattern has been to improve after several days of lactulose . Continue lactulose , rifaximin , trend serum ammonia.  Mentation slightly clouded but stable. No significant improvement thus far.   Acute on chronic upper abdominal pain  Abdominal wall hernia Lipase and LFTs unremarkable.   CT abdomen pelvis showed new diffuse central mesenteric edema consider small bowel pathology or infectious/inflammatory process.  New presacral edema body wall edema.  Questionable descending and sigmoid colon wall thickening--doubt colitis, suspect related to lactulose , stable small ventral hernia, aortic atherosclerosis Generally tender on examination but better today.  Seen by general surgery.  No further recommendations.  Intertrigo of skin fold under left breast Nystatin  powder.   Right great toe pain History of gout Monitor   Metabolic acidosis Chronic.  Continue bicarb   Chronic anemia Hemoglobin at baseline.   Chronic thrombocytopenia Platelet count stable.   Chronic hypotension: Blood pressure currently stable. Hyperlipidemia Depression/anxiety Hypothyroidism GERD      Subjective:  Feels ok Less abdominal pain today  Physical Exam: Vitals:   08/25/23 0757 08/25/23 1321 08/25/23 2043 08/26/23 0503  BP: (!) 122/56 (!) 169/90  (!) 113/49 121/65  Pulse: 79 73 77 77  Resp: 20 18 20 12   Temp: 98.8 F (37.1 C) 98.6 F (37 C) 98.2 F (36.8 C) 98.4 F (36.9 C)  TempSrc: Oral Oral Oral Oral  SpO2: 98% 100% 100% 100%  Weight:      Height:       Physical Exam Vitals reviewed.  Constitutional:      General: She is not in acute distress.    Appearance: She is ill-appearing. She is not toxic-appearing.  Cardiovascular:     Rate and Rhythm: Normal rate and regular rhythm.     Heart sounds: No murmur heard. Pulmonary:     Effort: Pulmonary effort is normal. No respiratory distress.     Breath sounds: No wheezing, rhonchi or rales.  Neurological:     Mental Status: She is alert.  Psychiatric:        Mood and Affect: Mood normal.        Behavior: Behavior normal.     Data Reviewed: BMP noted AST 65 Hemoglobin 10.6 Platelets 131  Family Communication: none  Disposition: Status is: Inpatient Remains inpatient appropriate because: hepatic encephalopathy     Time spent: 25 minutes  Author: Toribio Door, MD 08/26/2023 8:39 AM  For on call review [email or web address].

## 2023-08-26 NOTE — Plan of Care (Signed)

## 2023-08-26 NOTE — Progress Notes (Signed)
 Mobility Specialist - Progress Note   08/26/23 1100  Mobility  Activity Ambulated with assistance  Level of Assistance Standby assist, set-up cues, supervision of patient - no hands on  Assistive Device Front wheel walker  Distance Ambulated (ft) 100 ft (1 seated rest break)  Range of Motion/Exercises Active  Activity Response Tolerated well  Mobility Referral Yes  Mobility visit 1 Mobility  Mobility Specialist Start Time (ACUTE ONLY) 0950  Mobility Specialist Stop Time (ACUTE ONLY) 1015  Mobility Specialist Time Calculation (min) (ACUTE ONLY) 25 min   Received on BSC, agreed to mobility. No issues ambulating, needed a seated rest break roughly at 60 ft. Continued ambulating and returned to bed with all needs met.  Cyndee Ada Mobility Specialist

## 2023-08-27 DIAGNOSIS — K7682 Hepatic encephalopathy: Secondary | ICD-10-CM | POA: Diagnosis not present

## 2023-08-27 DIAGNOSIS — K7581 Nonalcoholic steatohepatitis (NASH): Secondary | ICD-10-CM | POA: Diagnosis not present

## 2023-08-27 DIAGNOSIS — D696 Thrombocytopenia, unspecified: Secondary | ICD-10-CM | POA: Diagnosis not present

## 2023-08-27 DIAGNOSIS — K7290 Hepatic failure, unspecified without coma: Secondary | ICD-10-CM | POA: Diagnosis not present

## 2023-08-27 LAB — COMPREHENSIVE METABOLIC PANEL WITH GFR
ALT: 42 U/L (ref 0–44)
AST: 54 U/L — ABNORMAL HIGH (ref 15–41)
Albumin: 2.4 g/dL — ABNORMAL LOW (ref 3.5–5.0)
Alkaline Phosphatase: 118 U/L (ref 38–126)
Anion gap: 5 (ref 5–15)
BUN: 23 mg/dL (ref 8–23)
CO2: 18 mmol/L — ABNORMAL LOW (ref 22–32)
Calcium: 8.2 mg/dL — ABNORMAL LOW (ref 8.9–10.3)
Chloride: 112 mmol/L — ABNORMAL HIGH (ref 98–111)
Creatinine, Ser: 0.83 mg/dL (ref 0.44–1.00)
GFR, Estimated: >60 mL/min (ref >60)
Glucose, Bld: 86 mg/dL (ref 70–99)
Potassium: 3.4 mmol/L — ABNORMAL LOW (ref 3.5–5.1)
Sodium: 135 mmol/L (ref 135–145)
Total Bilirubin: 1.5 mg/dL — ABNORMAL HIGH (ref 0.0–1.2)
Total Protein: 5.5 g/dL — ABNORMAL LOW (ref 6.5–8.1)

## 2023-08-27 LAB — AMMONIA: Ammonia: 59 umol/L — ABNORMAL HIGH (ref 9–35)

## 2023-08-27 IMAGING — US US EXTREM LOW VENOUS
1 series · 13 of 24 positions shown · non-contrast
Comparison: None Available.

CLINICAL DATA: Pain and swelling.



[Series 1: us extrem low venous · 13 of 37 slices shown]
[im 1/37]
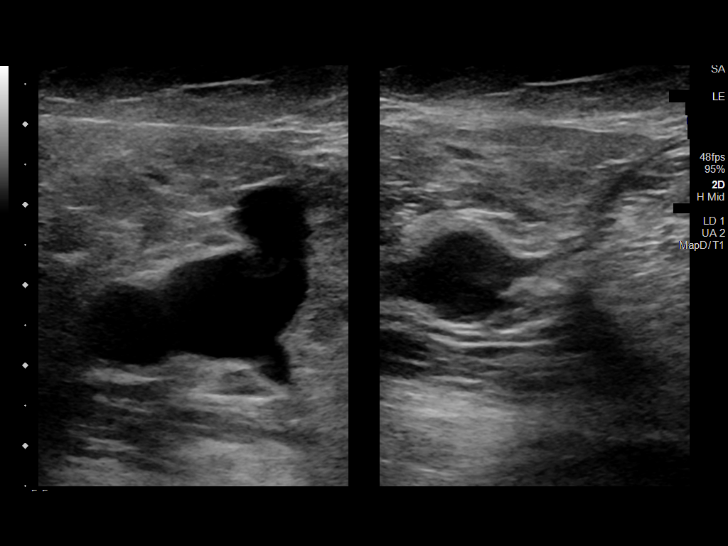
[im 4/37]
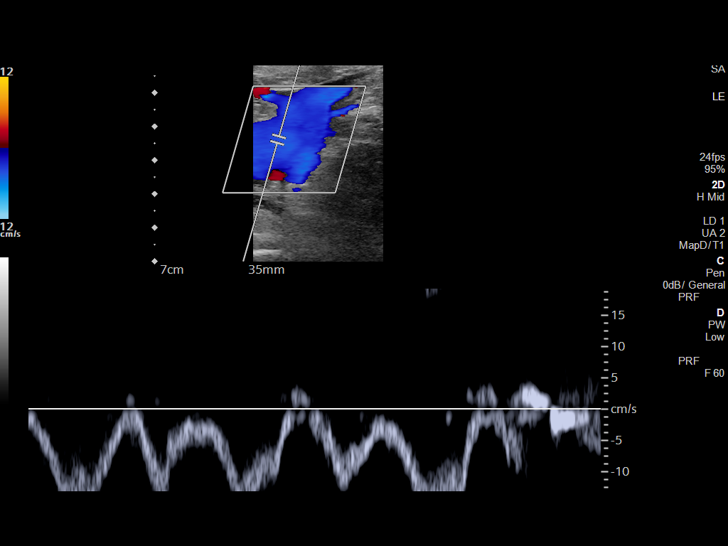
[im 7/37]
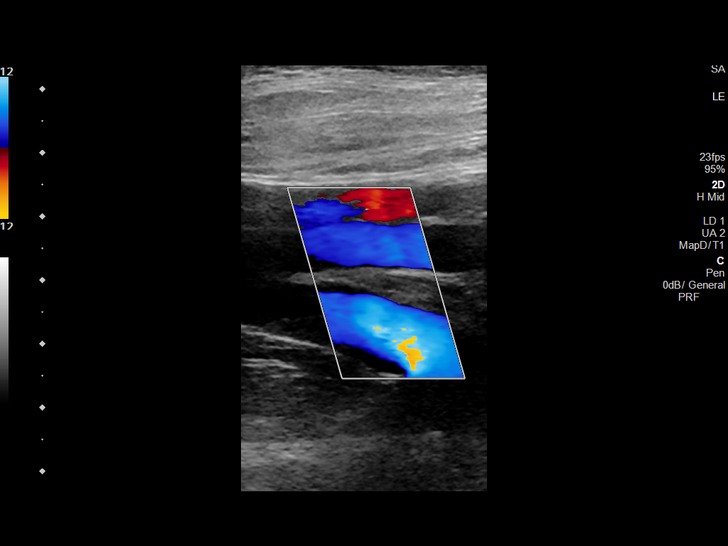
[im 10/37]
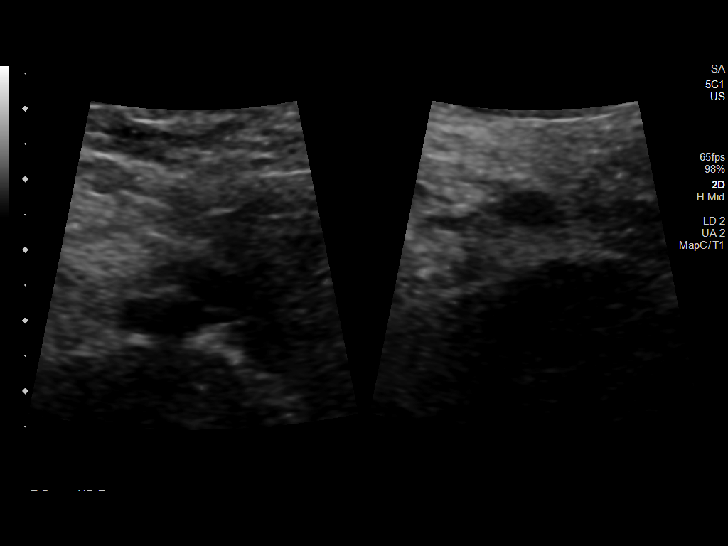
[im 13/37]
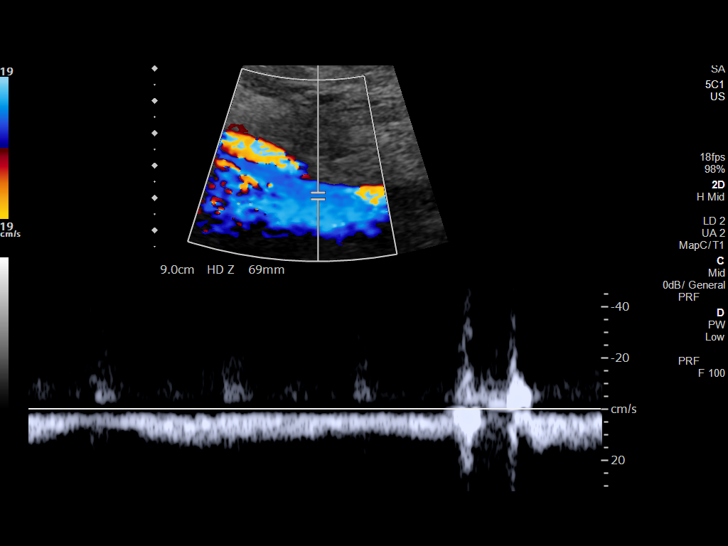
[im 16/37]
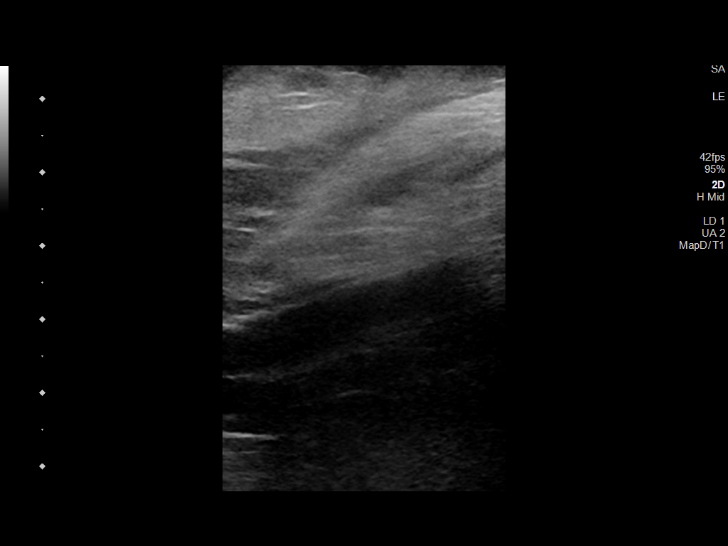
[im 19/37]
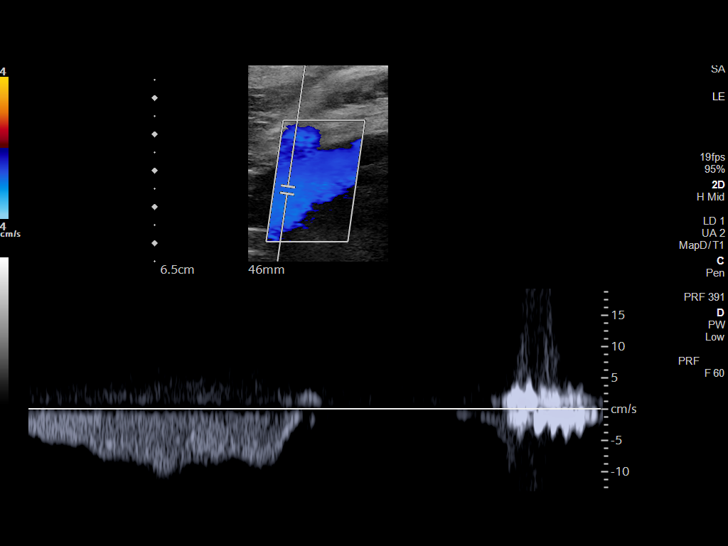
[im 21/37]
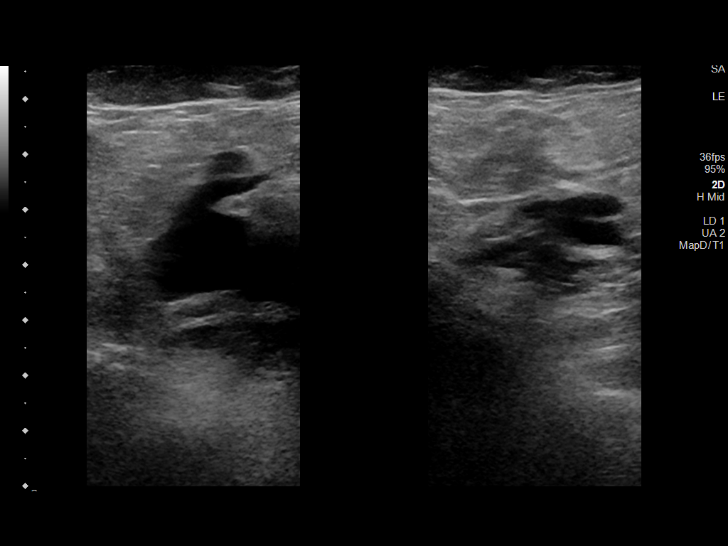
[im 24/37]
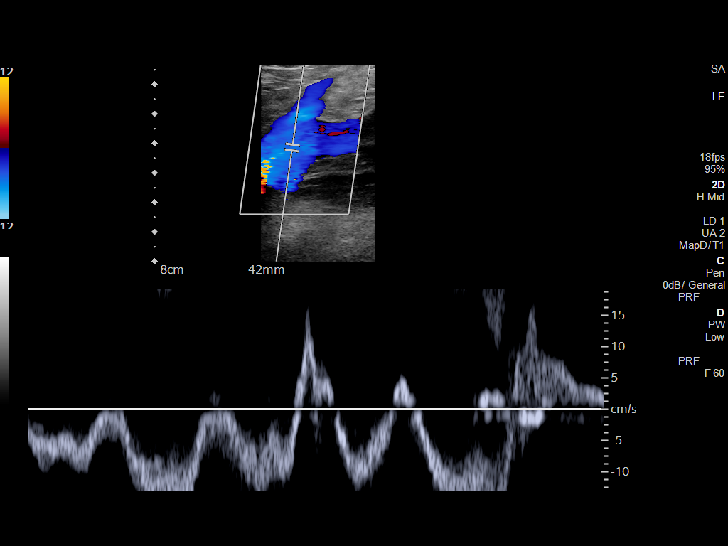
[im 27/37]
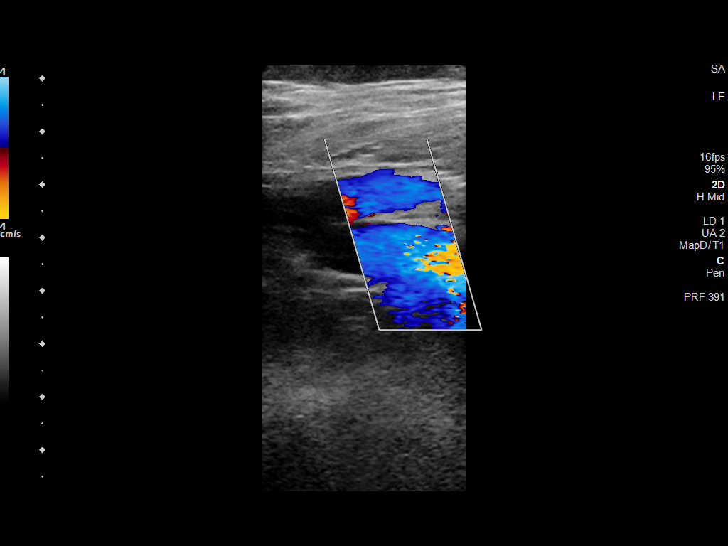
[im 30/37]
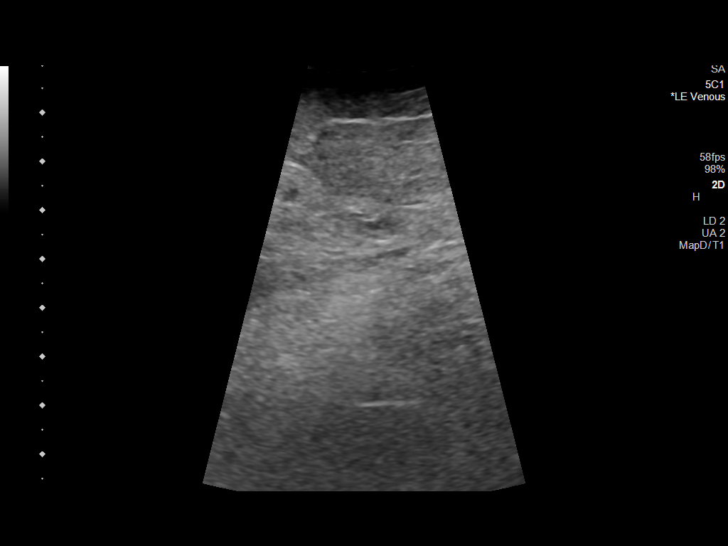
[im 33/37]
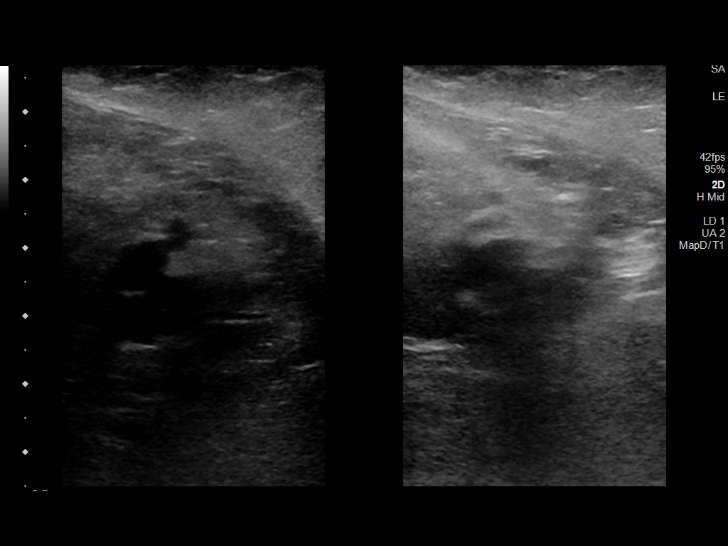
[im 37/37]
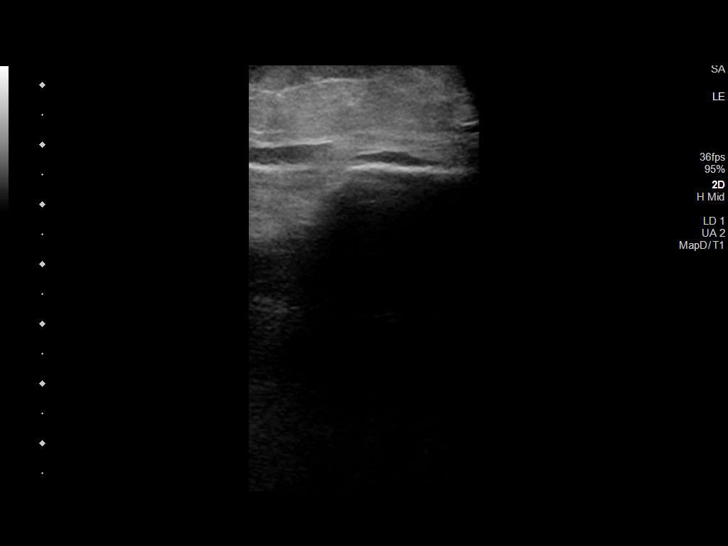

[13 of 24 positions shown; findings below may reference images not displayed]

FINDINGS: RIGHT LOWER EXTREMITY

Common Femoral Vein: No evidence of thrombus. Normal
compressibility, respiratory phasicity and response to augmentation.

Saphenofemoral Junction: No evidence of thrombus. Normal
compressibility and flow on color Doppler imaging.

Profunda Femoral Vein: No evidence of thrombus. Normal
compressibility and flow on color Doppler imaging.

Femoral Vein: Not seen.

Popliteal Vein: No evidence of thrombus. Normal compressibility,
respiratory phasicity and response to augmentation.

Calf Veins: Not seen.

Venous Reflux:  None.

Other Findings: Examination significantly limited secondary to body
habitus.

LEFT LOWER EXTREMITY

Common Femoral Vein: No evidence of thrombus. Normal
compressibility, respiratory phasicity and response to augmentation.

Saphenofemoral Junction: No evidence of thrombus. Normal
compressibility and flow on color Doppler imaging.

Profunda Femoral Vein: No evidence of thrombus. Normal
compressibility and flow on color Doppler imaging.

Femoral Vein: Not seen.

Popliteal Vein: No evidence of thrombus. Normal compressibility,
respiratory phasicity and response to augmentation.

Calf Veins: Not seen.

Venous Reflux:  None.

Other Findings: Examination significantly limited secondary to body
habitus.
IMPRESSION: 1. No evidence of deep venous thrombosis in either lower extremity.
2. Examination is significantly limited secondary to body habitus.
Bilateral femoral veins and calf veins are not well seen on this
study.

## 2023-08-27 MED ORDER — POTASSIUM CHLORIDE CRYS ER 20 MEQ PO TBCR
40.0000 meq | EXTENDED_RELEASE_TABLET | Freq: Once | ORAL | Status: AC
  Administered 2023-08-27: 40 meq via ORAL
  Filled 2023-08-27: qty 2

## 2023-08-27 MED ORDER — METHOCARBAMOL 500 MG PO TABS
500.0000 mg | ORAL_TABLET | Freq: Three times a day (TID) | ORAL | Status: DC | PRN
  Administered 2023-08-27: 500 mg via ORAL
  Filled 2023-08-27: qty 1

## 2023-08-27 MED ORDER — COLCHICINE 0.6 MG PO TABS
0.6000 mg | ORAL_TABLET | Freq: Once | ORAL | Status: AC
  Administered 2023-08-27: 0.6 mg via ORAL
  Filled 2023-08-27: qty 1

## 2023-08-27 MED ORDER — COLCHICINE 0.6 MG PO TABS
0.6000 mg | ORAL_TABLET | Freq: Two times a day (BID) | ORAL | Status: DC
  Administered 2023-08-28 – 2023-08-29 (×3): 0.6 mg via ORAL
  Filled 2023-08-27 (×3): qty 1

## 2023-08-27 MED ORDER — COLCHICINE 0.6 MG PO TABS
1.2000 mg | ORAL_TABLET | Freq: Once | ORAL | Status: AC
  Administered 2023-08-27: 1.2 mg via ORAL
  Filled 2023-08-27: qty 2

## 2023-08-27 MED ORDER — PANTOPRAZOLE SODIUM 40 MG PO TBEC
40.0000 mg | DELAYED_RELEASE_TABLET | Freq: Two times a day (BID) | ORAL | Status: DC
  Administered 2023-08-27 – 2023-08-29 (×5): 40 mg via ORAL
  Filled 2023-08-27 (×5): qty 1

## 2023-08-27 MED ORDER — FOLIC ACID 1 MG PO TABS
1.0000 mg | ORAL_TABLET | Freq: Every day | ORAL | Status: DC
  Administered 2023-08-27 – 2023-08-29 (×3): 1 mg via ORAL
  Filled 2023-08-27 (×3): qty 1

## 2023-08-27 MED ORDER — FUROSEMIDE 40 MG PO TABS
40.0000 mg | ORAL_TABLET | Freq: Two times a day (BID) | ORAL | Status: DC
  Administered 2023-08-27 – 2023-08-29 (×4): 40 mg via ORAL
  Filled 2023-08-27 (×4): qty 1

## 2023-08-27 NOTE — Progress Notes (Signed)
  Progress Note   Patient: [name] FMW:[id number] DOB: 1956-01-09 DOA: 08/24/2023     3 DOS: the patient was seen and examined on 08/27/2023   Brief hospital course: 68 year old woman PMH including Nash cirrhosis, recurrent hepatic encephalopathy, short gut syndrome, CKD presented with confusion.  Consultants General surgery  Procedures/Events 8/14 admit for acute hepatic encephalopathy  Assessment and Plan: Recurrent hepatic encephalopathy Decompensated NASH cirrhosis with associated thrombocytopenia, hyperbilirubinemia Compliant with lactulose  Serum ammonia significantly high on admission.  Ninth hospitalization this year. Previous pattern has been to improve after several days of lactulose . Continue lactulose , rifaximin , trend serum ammonia.  Mentation much improved.   Acute on chronic upper abdominal pain  Abdominal wall hernia Lipase and LFTs unremarkable.   CT abdomen pelvis showed new diffuse central mesenteric edema consider small bowel pathology or infectious/inflammatory process.  New presacral edema body wall edema.  Questionable descending and sigmoid colon wall thickening--doubt colitis, suspect related to lactulose , stable small ventral hernia, aortic atherosclerosis Generally tender on examination but better today.  Seen by general surgery.  No further recommendations. Clinically much better.   Intertrigo of skin fold under left breast Nystatin  powder.   Right great toe pain Probable acute gout History of [facility] colchicine    None anion gap metabolic acidosis Chronic, probably secondary to frequent bowel movements.  Continue bicarb   Chronic anemia Hemoglobin at baseline.   Chronic thrombocytopenia Platelet count stable.   Chronic hypotension: Blood pressure currently stable. Hyperlipidemia Depression/anxiety Hypothyroidism GERD      Subjective:  Feels better mentally, but both feet hurt, especially right foot and right big toe Has  had gout before  Physical Exam: Vitals:   08/25/23 2043 08/26/23 0503 08/26/23 2039 08/27/23 0430  BP: (!) 113/49 121/65 (!) 117/54 127/60  Pulse: 77 77 72 71  Resp: 20 12 20 16   Temp: 98.2 F (36.8 C) 98.4 F (36.9 C) 98.3 F (36.8 C) 98.9 F (37.2 C)  TempSrc: Oral Oral Oral Oral  SpO2: 100% 100% 100% 100%  Weight:      Height:       Physical Exam Vitals reviewed.  Constitutional:      General: She is not in acute distress.    Appearance: She is not ill-appearing or toxic-appearing.  Cardiovascular:     Rate and Rhythm: Normal rate and regular rhythm.     Heart sounds: No murmur heard. Pulmonary:     Effort: Pulmonary effort is normal. No respiratory distress.     Breath sounds: No wheezing, rhonchi or rales.  Abdominal:     Palpations: Abdomen is soft.     Tenderness: There is abdominal tenderness (Mild generalized tenderness).  Musculoskeletal:     Right lower leg: No edema.     Left lower leg: No edema.  Neurological:     Mental Status: She is alert.  Psychiatric:        Mood and Affect: Mood normal.        Behavior: Behavior normal.     Data Reviewed: BM x 4 Potassium down to 3.4 Bicarb 18 AST down to 54 Total bilirubin stable 1.5  Family Communication: son at bedside  Disposition: Status is: Inpatient Remains inpatient appropriate because: acute hepatic encephalopathy      Time spent: 20 minutes  Author: Toribio Door, MD 08/27/2023 8:49 AM  For on call review [email or web address].

## 2023-08-27 NOTE — Progress Notes (Signed)
 Mobility Specialist - Progress Note   08/27/23 1000  Mobility  Activity Ambulated with assistance  Level of Assistance Contact guard assist, steadying assist  Assistive Device Front wheel walker  Distance Ambulated (ft) 100 ft  Range of Motion/Exercises Active  Activity Response Tolerated well  Mobility Referral Yes  Mobility visit 1 Mobility  Mobility Specialist Start Time (ACUTE ONLY) 1012  Mobility Specialist Stop Time (ACUTE ONLY) 1026  Mobility Specialist Time Calculation (min) (ACUTE ONLY) 14 min   Received in bed and agreed to mobility. Endorsed pain in Bil knees. Returned to bed with all needs met.  Cyndee Ada Mobility Specialist

## 2023-08-28 ENCOUNTER — Inpatient Hospital Stay (HOSPITAL_COMMUNITY): Payer: Medicare (Managed Care)

## 2023-08-28 DIAGNOSIS — K7581 Nonalcoholic steatohepatitis (NASH): Secondary | ICD-10-CM | POA: Diagnosis not present

## 2023-08-28 DIAGNOSIS — M109 Gout, unspecified: Secondary | ICD-10-CM | POA: Insufficient documentation

## 2023-08-28 DIAGNOSIS — K7290 Hepatic failure, unspecified without coma: Secondary | ICD-10-CM | POA: Diagnosis not present

## 2023-08-28 DIAGNOSIS — K7460 Unspecified cirrhosis of liver: Secondary | ICD-10-CM | POA: Diagnosis not present

## 2023-08-28 DIAGNOSIS — K7682 Hepatic encephalopathy: Secondary | ICD-10-CM | POA: Diagnosis not present

## 2023-08-28 LAB — COMPREHENSIVE METABOLIC PANEL WITH GFR
ALT: 37 U/L (ref 0–44)
AST: 43 U/L — ABNORMAL HIGH (ref 15–41)
Albumin: 2.5 g/dL — ABNORMAL LOW (ref 3.5–5.0)
Alkaline Phosphatase: 120 U/L (ref 38–126)
Anion gap: 7 (ref 5–15)
BUN: 20 mg/dL (ref 8–23)
CO2: 16 mmol/L — ABNORMAL LOW (ref 22–32)
Calcium: 8.6 mg/dL — ABNORMAL LOW (ref 8.9–10.3)
Chloride: 115 mmol/L — ABNORMAL HIGH (ref 98–111)
Creatinine, Ser: 0.85 mg/dL (ref 0.44–1.00)
GFR, Estimated: >60 mL/min (ref >60)
Glucose, Bld: 115 mg/dL — ABNORMAL HIGH (ref 70–99)
Potassium: 3.7 mmol/L (ref 3.5–5.1)
Sodium: 138 mmol/L (ref 135–145)
Total Bilirubin: 1.6 mg/dL — ABNORMAL HIGH (ref 0.0–1.2)
Total Protein: 5.5 g/dL — ABNORMAL LOW (ref 6.5–8.1)

## 2023-08-28 LAB — AMMONIA: Ammonia: 125 umol/L — ABNORMAL HIGH (ref 9–35)

## 2023-08-28 MED ORDER — ORAL CARE MOUTH RINSE: 15.0000 mL | OROMUCOSAL | Status: DC | PRN

## 2023-08-28 MED ORDER — PREDNISONE 20 MG PO TABS
40.0000 mg | ORAL_TABLET | Freq: Every day | ORAL | Status: AC
  Administered 2023-08-28: 40 mg via ORAL
  Filled 2023-08-28: qty 2

## 2023-08-28 NOTE — Progress Notes (Signed)
 Mobility Specialist - Progress Note   08/28/23 1059  Mobility  Activity Ambulated with assistance  Level of Assistance Contact guard assist, steadying assist  Assistive Device Front wheel walker  Distance Ambulated (ft) 100 ft  Range of Motion/Exercises Active  Activity Response Tolerated fair  Mobility Referral Yes  Mobility visit 1 Mobility  Mobility Specialist Start Time (ACUTE ONLY) 1030  Mobility Specialist Stop Time (ACUTE ONLY) 1059  Mobility Specialist Time Calculation (min) (ACUTE ONLY) 29 min   Pt was found sitting EOB and agreeable to ambulate. Required assistance to avoid objects in hallway due to L eye blindness. Grew very fatigued with session and c/o knee pain. At EOS returned to recliner chair with all needs met. NT in room.  Erminio Leos,  Mobility Specialist Can be reached via Secure Chat

## 2023-08-28 NOTE — Progress Notes (Addendum)
 Progress Note   Patient: [name] FMW:[id number] DOB: Aug 14, 1955 DOA: 08/24/2023     4 DOS: the patient was seen and examined on 08/28/2023   Brief hospital course: 68 year old woman PMH including Nash cirrhosis, recurrent hepatic encephalopathy, short gut syndrome, CKD presented with confusion.  Consultants General surgery  Procedures/Events 8/14 admit for acute hepatic encephalopathy  Assessment and Plan: Recurrent hepatic encephalopathy Decompensated NASH cirrhosis with associated thrombocytopenia, hyperbilirubinemia Compliant with lactulose  Serum ammonia significantly high on admission.  Ninth hospitalization this year. Previous pattern has been to improve after several days of lactulose . Despite the fact that ammonia has significantly elevated today, this does not correlate clinically.  Continue lactulose , rifaximin .  Mentation appears to be at baseline.   Acute on chronic upper abdominal pain  Abdominal wall hernia Lipase and LFTs unremarkable.   CT abdomen pelvis showed new diffuse central mesenteric edema consider small bowel pathology or infectious/inflammatory process.  New presacral edema body wall edema.  Questionable descending and sigmoid colon wall thickening--doubt colitis, suspect related to lactulose , stable small ventral hernia, aortic atherosclerosis Generally tender on examination but better today.  Seen by general surgery.  No further recommendations. Clinically much better.   Intertrigo of skin fold under left breast Nystatin  powder.   Right great toe pain Probable acute gout History of gout Still has feet pain, continue colchicine , prednisone  x 1 Will image right knee given knee pain, suspect osteoarthritis   None anion gap metabolic acidosis Chronic, probably secondary to frequent bowel movements.  Continue bicarb   Chronic anemia Hemoglobin at baseline.   Chronic thrombocytopenia Platelet count stable.   Chronic hypotension: Blood pressure  currently stable. Hyperlipidemia Depression/anxiety Hypothyroidism GERD  Obesity class II  Body mass index is 38.52 kg/m.     Subjective:  C/o right knee pain Still has pain in bilateral feet Bowels moving  Physical Exam: Vitals:   08/27/23 0430 08/27/23 1354 08/27/23 2101 08/28/23 0555  BP: 127/60 123/66 (!) 154/74 136/67  Pulse: 71 71 84 77  Resp: 16 18  18   Temp: 98.9 F (37.2 C) 98.1 F (36.7 C) 98.2 F (36.8 C) 98.9 F (37.2 C)  TempSrc: Oral Oral    SpO2: 100% 100% 97% 99%  Weight:      Height:       Physical Exam Vitals reviewed.  Constitutional:      General: She is not in acute distress.    Appearance: She is not ill-appearing or toxic-appearing.  Cardiovascular:     Rate and Rhythm: Normal rate and regular rhythm.     Heart sounds: No murmur heard. Pulmonary:     Effort: Pulmonary effort is normal. No respiratory distress.     Breath sounds: No wheezing, rhonchi or rales.  Abdominal:     General: There is no distension.     Palpations: Abdomen is soft.  Musculoskeletal:     Comments: Bilateral knees appear unremarkable.  There is fairly good range of motion at the right knee.  Bilateral feet appear unremarkable. Nontender to palpation.  Neurological:     Mental Status: She is alert.  Psychiatric:        Mood and Affect: Mood normal.        Behavior: Behavior normal.     Data Reviewed: CMP noted  Family Communication: son at bedsid3  Disposition: Status is: Inpatient Remains inpatient appropriate because: acute hepatic encephalopathy     Time spent: 20 minutes  Author: Toribio Door, MD 08/28/2023 8:41 AM  For on call review  [email or web address]

## 2023-08-29 ENCOUNTER — Other Ambulatory Visit (HOSPITAL_COMMUNITY): Payer: Self-pay

## 2023-08-29 DIAGNOSIS — D696 Thrombocytopenia, unspecified: Secondary | ICD-10-CM | POA: Diagnosis not present

## 2023-08-29 DIAGNOSIS — M109 Gout, unspecified: Secondary | ICD-10-CM | POA: Diagnosis not present

## 2023-08-29 DIAGNOSIS — K7581 Nonalcoholic steatohepatitis (NASH): Secondary | ICD-10-CM | POA: Diagnosis not present

## 2023-08-29 DIAGNOSIS — K7682 Hepatic encephalopathy: Secondary | ICD-10-CM | POA: Diagnosis not present

## 2023-08-29 LAB — COMPREHENSIVE METABOLIC PANEL WITH GFR
ALT: 31 U/L (ref 0–44)
AST: 31 U/L (ref 15–41)
Albumin: 2.2 g/dL — ABNORMAL LOW (ref 3.5–5.0)
Alkaline Phosphatase: 119 U/L (ref 38–126)
Anion gap: 5 (ref 5–15)
BUN: 19 mg/dL (ref 8–23)
CO2: 15 mmol/L — ABNORMAL LOW (ref 22–32)
Calcium: 8.5 mg/dL — ABNORMAL LOW (ref 8.9–10.3)
Chloride: 120 mmol/L — ABNORMAL HIGH (ref 98–111)
Creatinine, Ser: 0.83 mg/dL (ref 0.44–1.00)
GFR, Estimated: >60 mL/min (ref >60)
Glucose, Bld: 158 mg/dL — ABNORMAL HIGH (ref 70–99)
Potassium: 3.3 mmol/L — ABNORMAL LOW (ref 3.5–5.1)
Sodium: 140 mmol/L (ref 135–145)
Total Bilirubin: 1.1 mg/dL (ref 0.0–1.2)
Total Protein: 5.2 g/dL — ABNORMAL LOW (ref 6.5–8.1)

## 2023-08-29 MED ORDER — POTASSIUM CHLORIDE CRYS ER 20 MEQ PO TBCR
40.0000 meq | EXTENDED_RELEASE_TABLET | Freq: Once | ORAL | Status: AC
  Administered 2023-08-29: 40 meq via ORAL
  Filled 2023-08-29: qty 2

## 2023-08-29 MED ORDER — COLCHICINE 0.6 MG PO TABS
0.6000 mg | ORAL_TABLET | Freq: Two times a day (BID) | ORAL | 0 refills | Status: DC
  Filled 2023-08-29: qty 14, 7d supply, fill #0

## 2023-08-29 MED ORDER — ATORVASTATIN CALCIUM 40 MG PO TABS
40.0000 mg | ORAL_TABLET | Freq: Every day | ORAL | Status: DC
  Administered 2023-08-29: 40 mg via ORAL
  Filled 2023-08-29: qty 1

## 2023-08-29 NOTE — Progress Notes (Signed)
 Mobility Specialist - Progress Note   08/29/23 1332  Mobility  Activity Ambulated with assistance  Level of Assistance Contact guard assist, steadying assist  Assistive Device Front wheel walker  Distance Ambulated (ft) 50 ft  Range of Motion/Exercises Active  Activity Response Tolerated well  Mobility Referral Yes  Mobility visit 1 Mobility  Mobility Specialist Start Time (ACUTE ONLY) 1320  Mobility Specialist Stop Time (ACUTE ONLY) 1332  Mobility Specialist Time Calculation (min) (ACUTE ONLY) 12 min   Pt was found in bed and agreeable to ambulate. C/o gout on BLE. At EOS returned to bed with all needs met. Call bell in reach.  Erminio Leos,  Mobility Specialist Can be reached via Secure Chat

## 2023-08-29 NOTE — Progress Notes (Signed)
 Discharge medication delivered to patient at bedside [facility]

## 2023-08-29 NOTE — TOC Transition Note (Signed)
 Transition of Care [facility]) - Discharge Note   Patient Details  Name: [name] MRN: [id number] Date of Birth: 07-Jan-1956  Transition of Care [facility] CM/SW Contact:  Bascom Service, RN Phone Number: 08/29/2023, 4:00 PM   Clinical Narrative: d/c home no needs or orders.      Final next level of care: Home/Self Care Barriers to Discharge: No Barriers Identified   Patient Goals and CMS Choice Patient states their goals for this hospitalization and ongoing recovery are:: Home   Choice offered to / list presented to : NA      Discharge Placement                       Discharge Plan and Services Additional resources added to the After Visit Summary for   In-house Referral: Clinical Social Work   Post Acute Care Choice: NA          DME Arranged: N/A DME Agency: NA                  Social Drivers of Health (SDOH) Interventions SDOH Screenings   Food Insecurity: No Food Insecurity (08/25/2023)  Housing: Low Risk  (08/25/2023)  Transportation Needs: No Transportation Needs (08/25/2023)  Utilities: Not At Risk (08/25/2023)  Depression (PHQ2-9): Medium Risk (07/24/2023)  Financial Resource Strain: Low Risk  (06/09/2023)  Social Connections: Unknown (08/25/2023)  Tobacco Use: Medium Risk (08/24/2023)     Readmission Risk Interventions    08/25/2023   10:04 AM 08/09/2023   12:37 PM 04/07/2023    1:23 PM  Readmission Risk Prevention Plan  Transportation Screening Complete  Complete  Medication Review Oceanographer) Complete Complete Complete  PCP or Specialist appointment within 3-5 days of discharge  Complete   HRI or Home Care Consult Complete Complete Complete  SW Recovery Care/Counseling Consult Complete Complete Complete  Palliative Care Screening Not Applicable Not Applicable Not Applicable  Skilled Nursing Facility Not Applicable Not Applicable Not Applicable

## 2023-08-29 NOTE — Discharge Summary (Signed)
 Physician Discharge Summary   Patient: [name] MRN: [id number] DOB: 02/12/1955  Admit date:     08/24/2023  Discharge date: 08/29/23  Discharge Physician: Toribio Door   PCP: [name], MD   Recommendations at discharge:   Follow-up hepatic encephalopathy  Discharge Diagnoses: Principal Problem:   Acute hepatic encephalopathy (HCC) Active Problems:   Liver cirrhosis secondary to NASH (HCC)   Thrombocytopenia (HCC) - Baseline 100-130K   Decompensated cirrhosis (HCC)   Acute abdominal pain   Cough   Intertrigo   Acute gout  Resolved Problems:   Acute hepatic encephalopathy [facility])  Hospital Course: 68 year old woman PMH including Nash cirrhosis, recurrent hepatic encephalopathy, short gut syndrome, CKD presented with confusion.  Treated for hepatic encephalopathy with gradual clinical improvement.  Consultants General surgery  Procedures/Events 8/14 admit for acute hepatic encephalopathy  Recurrent hepatic encephalopathy Decompensated NASH cirrhosis with associated thrombocytopenia, hyperbilirubinemia Compliant with lactulose  Serum ammonia significantly high on admission.  Ninth hospitalization this year. Previous pattern has been to improve after several days of lactulose . Despite the fact that ammonia has significantly elevated again prior to discharge, clinically the patient is back to baseline.  Continue lactulose  and rifaximin .  Discharged home in good condition.   Acute on chronic upper abdominal pain  Abdominal wall hernia Lipase and LFTs unremarkable.   CT abdomen pelvis showed new diffuse central mesenteric edema consider small bowel pathology or infectious/inflammatory process.  New presacral edema body wall edema.  Questionable descending and sigmoid colon wall thickening--doubt colitis, suspect related to lactulose , stable small ventral hernia, aortic atherosclerosis Generally tender on examination but better today.  Seen by general surgery.  No  further recommendations. Clinically much better.   Intertrigo of skin fold under left breast Nystatin  powder.   Right great toe pain Probable acute gout History of gout Improving with colchicine .   None anion gap metabolic acidosis Chronic, probably secondary to frequent bowel movements.  Continue bicarb   Chronic anemia Hemoglobin at baseline.   Chronic thrombocytopenia Platelet count stable.   Chronic hypotension: Blood pressure currently stable. Hyperlipidemia Depression/anxiety Hypothyroidism GERD   Obesity class II  Body mass index is 38.52 kg/m.  Disposition: Home Diet recommendation:  Discharge Diet Orders (From admission, onward)     Start     Ordered   08/29/23 0000  Diet - low sodium heart healthy        08/29/23 1434           Regular diet DISCHARGE MEDICATION: Allergies as of 08/29/2023       Reactions   Ace Inhibitors Anaphylaxis, Swelling   Gadolinium Derivatives Anaphylaxis   Ivp Dye [iodinated Contrast Media] Anaphylaxis   Wound Dressing Adhesive Dermatitis   Desitin [zinc  Oxide] Rash, Other (See Comments)   Worsens rash   Yellow Jacket Venom Swelling   Chlorhexidine  Rash, Other (See Comments)   WORSENS RASHES   Doxycycline Rash   Penicillins Dermatitis, Rash   Zofran  [ondansetron ] Rash        Medication List     TAKE these medications    albuterol  108 (90 Base) MCG/ACT inhaler Commonly known as: VENTOLIN  HFA Inhale 2 puffs into the lungs every 6 (six) hours as needed for shortness of breath or wheezing.   atorvastatin  40 MG tablet Commonly known as: LIPITOR Take 1 tablet (40 mg total) by mouth in the morning.   CENTRUM SILVER ULTRA WOMENS PO Take 1 tablet by mouth in the morning.   colchicine  0.6 MG tablet Take 1 tablet (0.6 mg  total) by mouth 2 (two) times daily.   copper  tablet Take 2 mg by mouth in the morning and at bedtime.   cyanocobalamin  1000 MCG tablet Commonly known as: VITAMIN B12 Take 2,000 mcg by  mouth every Monday, Wednesday, and Friday.   dicyclomine  10 MG capsule Commonly known as: BENTYL  Take 1 capsule (10 mg total) by mouth 3 (three) times daily as needed for spasms (abdominal cramping).   EPINEPHrine  0.3 mg/0.3 mL Soaj injection Commonly known as: EPI-PEN Inject 0.3 mg into the muscle as needed for anaphylaxis.   famotidine  20 MG tablet Commonly known as: PEPCID  Take 20 mg by mouth daily as needed for heartburn or indigestion.   folic acid  1 MG tablet Commonly known as: FOLVITE  Take 1 tablet (1 mg total) by mouth daily.   furosemide  40 MG tablet Commonly known as: LASIX  Take 1 tablet (40 mg total) by mouth 2 (two) times daily.   HYDROcodone -acetaminophen  10-325 MG tablet Commonly known as: NORCO Take 1 tablet by mouth every 8 (eight) hours as needed for moderate pain (pain score 4-6).   IRON -VITAMIN C  PO Take 1 tablet by mouth daily with breakfast.   lactulose  10 GM/15ML solution Commonly known as: CHRONULAC  Take 45 mLs (30 g total) by mouth 3 (three) times daily. Take to ensure that there is 2 loose bowel movement EVERY DAY What changed:  when to take this additional instructions   levothyroxine  175 MCG tablet Commonly known as: SYNTHROID  Take 1 tablet (175 mcg total) by mouth daily at 6 (six) AM. Recheck TSH with PCP in 4 weeks   lidocaine  5 % Commonly known as: LIDODERM  Place 1 patch onto the skin daily. Remove & Discard patch within 12 hours or as directed by MD   magnesium  gluconate 500 (27 Mg) MG Tabs tablet Commonly known as: MAGONATE Take 1 tablet (500 mg total) by mouth in the morning and at bedtime.   meclizine  25 MG tablet Commonly known as: ANTIVERT  Take 1 tablet (25 mg total) by mouth 3 (three) times daily as needed for dizziness.   megestrol  40 MG tablet Commonly known as: MEGACE  Take 1 tablet (40 mg total) by mouth 2 (two) times daily.   methocarbamol  500 MG tablet Commonly known as: ROBAXIN  Take 500 mg by mouth in the morning.    midodrine  10 MG tablet Commonly known as: PROAMATINE  TAKE 1 TABLET(10 MG) BY MOUTH THREE TIMES DAILY WITH MEALS   naloxone 4 MG/0.1ML Liqd nasal spray kit Commonly known as: NARCAN Place 1 spray into the nose as needed (accidental overdose).   nystatin  cream Commonly known as: MYCOSTATIN  Apply 1 Application topically 2 (two) times daily as needed for dry skin.   pantoprazole  40 MG tablet Commonly known as: PROTONIX  Take 1 tablet (40 mg total) by mouth 2 (two) times daily before a meal.   potassium chloride  10 MEQ tablet Commonly known as: KLOR-CON  Take 2 tablets (20 mEq total) by mouth 2 (two) times daily.   rifaximin  550 MG Tabs tablet Commonly known as: XIFAXAN  Take 550 mg by mouth 2 (two) times daily.   rOPINIRole  0.25 MG tablet Commonly known as: REQUIP  TAKE 1 TABLET(0.25 MG) BY MOUTH AT BEDTIME   senna-docusate 8.6-50 MG tablet Commonly known as: Senokot-S TAKE 1 TABLET BY MOUTH TWICE DAILY   spironolactone  50 MG tablet Commonly known as: ALDACTONE  TAKE 1 TABLET(50 MG) BY MOUTH DAILY   triamcinolone  cream 0.1 % Commonly known as: KENALOG  Apply 1 Application topically 2 (two) times daily as needed (for  itching- affected areas).   venlafaxine  XR 75 MG 24 hr capsule Commonly known as: EFFEXOR -XR Take 1 capsule (75 mg total) by mouth daily with breakfast.   Vitamin D  (Ergocalciferol ) 1.25 MG (50000 UNIT) Caps capsule Commonly known as: DRISDOL Take 50,000 Units by mouth every Wednesday.        Follow-up Information     [name], MD. Schedule an appointment as soon as possible for a visit in 1 week(s).   Specialty: Family Medicine Contact information: [address] [place] [address] [phone]                Feels ok, ready to go home  Discharge Exam: Filed Weights   08/25/23 0233  Weight: 95.5 kg   Physical Exam Vitals reviewed.  Constitutional:      General: She is not in acute distress.    Appearance: She is  not ill-appearing or toxic-appearing.  Cardiovascular:     Rate and Rhythm: Normal rate and regular rhythm.     Heart sounds: No murmur heard. Pulmonary:     Effort: Pulmonary effort is normal. No respiratory distress.     Breath sounds: No wheezing, rhonchi or rales.  Neurological:     Mental Status: She is alert.  Psychiatric:        Mood and Affect: Mood normal.        Behavior: Behavior normal.       Condition at discharge: good  The results of significant diagnostics from this hospitalization (including imaging, microbiology, ancillary and laboratory) are listed below for reference.   Imaging Studies: DG Knee Right Port Result Date: 08/28/2023 CLINICAL DATA:  Knee pain. EXAM: PORTABLE RIGHT KNEE - 1-2 VIEW COMPARISON:  None Available. FINDINGS: The bones are subjectively under mineralized. Medial tibiofemoral joint space narrowing. Mild to moderate tricompartmental peripheral spurring. No fracture, erosion, or focal bone abnormality. No joint effusion. No focal soft tissue abnormalities. Question of mild edema in the calf. IMPRESSION: Mild to moderate tricompartmental osteoarthritis, most prominent in the medial tibiofemoral compartment. Electronically Signed   By: Andrea Gasman M.D.   On: 08/28/2023 15:14   CT ABDOMEN PELVIS WO CONTRAST Result Date: 08/24/2023 CLINICAL DATA:  Acute abdominal pain. EXAM: CT ABDOMEN AND PELVIS WITHOUT CONTRAST TECHNIQUE: Multidetector CT imaging of the abdomen and pelvis was performed following the standard protocol without IV contrast. RADIATION DOSE REDUCTION: This exam was performed according to the departmental dose-optimization program which includes automated exposure control, adjustment of the mA and/or kV according to patient size and/or use of iterative reconstruction technique. COMPARISON:  CT abdomen and pelvis 08/11/2023. FINDINGS: Lower chest: 4 mm left lower lobe nodule seen on image 7/23. Hepatobiliary: No focal liver abnormality is  seen. Nodular liver contour again noted. Status post cholecystectomy. No biliary dilatation. Pancreas: Unremarkable. No pancreatic ductal dilatation or surrounding inflammatory changes. Spleen: Mildly enlarged, unchanged. Adrenals/Urinary Tract: There are punctate nonobstructing bilateral renal calculi. There is no hydronephrosis or perinephric fluid. The adrenal glands and bladder are within normal limits. Stomach/Bowel: No postsurgical changes in the right colon. There is no bowel obstruction, pneumatosis or free air. There is questionable descending and sigmoid colon wall thickening versus normal under distension. There is no surrounding inflammation. The appendix is not seen. Small bowel loops are unremarkable, but there is central mesenteric edema diffusely. The stomach is within normal limits. Vascular/Lymphatic: Aorta and IVC are normal in size. There are atherosclerotic calcifications of the aorta and branch vessels. Varices are seen extending along  the left retroperitoneum and in the splenic lesions as well as in the central mesentery similar to prior. Reproductive: The uterus is enlarged and contains multiple calcified fibroids measuring up to 5.8 cm, unchanged. Adnexa are unremarkable. Other: There is new presacral edema. There is no ascites. There is new mild body wall edema. There is bulging of the anterior abdominal wall similar to the prior study. There is a small ventral hernia left of midline in the lower abdominal wall containing mesenteric fat and a small amount of fluid similar to the prior study. Musculoskeletal: The bones are diffusely osteopenic. Degenerative changes affect the spine. T12 vertebroplasty changes are present. IMPRESSION: 1. New diffuse central mesenteric edema, nonspecific. Findings may be related to small bowel pathology or infectious/inflammatory process. 2. New presacral edema and body wall edema. 3. Questionable descending and sigmoid colon wall thickening versus normal  under distension. Correlate clinically for colitis. 4. Stable findings of cirrhosis and portal hypertension. 5. Nonobstructing bilateral renal calculi. 6. Stable left lower lobe pulmonary nodule measuring 4 mm. 7. Stable small ventral hernia containing mesenteric fat and a small amount of fluid. 8. Stable fibroid uterus. Aortic Atherosclerosis (ICD10-I70.0). Electronically Signed   By: Greig Pique M.D.   On: 08/24/2023 23:15   CT HEAD WO CONTRAST ( ) Result Date: 08/24/2023 CLINICAL DATA:  Delirium EXAM: CT HEAD WITHOUT CONTRAST TECHNIQUE: Contiguous axial images were obtained from the base of the skull through the vertex without intravenous contrast. RADIATION DOSE REDUCTION: This exam was performed according to the departmental dose-optimization program which includes automated exposure control, adjustment of the mA and/or kV according to patient size and/or use of iterative reconstruction technique. COMPARISON:  None Available. FINDINGS: Brain: Normal anatomic configuration. Parenchymal volume loss is commensurate with the patient's age. Moderate periventricular white matter changes are present likely reflecting the sequela of small vessel ischemia. No abnormal intra or extra-axial mass lesion or fluid collection. No abnormal mass effect or midline shift. No evidence of acute intracranial hemorrhage or infarct. Ventricular size is normal. Cerebellum unremarkable. Vascular: No asymmetric hyperdense vasculature at the skull base. Skull: Intact Sinuses/Orbits: Paranasal sinuses are clear. Orbits are unremarkable. Other: Mastoid air cells and middle ear cavities are clear. IMPRESSION: 1. No acute intracranial abnormality. 2. Moderate senescent change.  A 3. Electronically Signed   By: Dorethia Molt M.D.   On: 08/24/2023 23:12   DG Chest 1 View Result Date: 08/24/2023 CLINICAL DATA:  Shortness of breath. Altered mental status. Ex-smoker. EXAM: CHEST  1 VIEW COMPARISON:  08/08/2023 FINDINGS: Stable poor  inspiration, borderline enlarged cardiac silhouette and tortuous and partially calcified thoracic aorta. Clear lungs with normal vascularity. Unremarkable bones. IMPRESSION: No acute abnormality. Electronically Signed   By: Elspeth Bathe M.D.   On: 08/24/2023 16:49   CT ABDOMEN PELVIS WO CONTRAST Result Date: 08/11/2023 CLINICAL DATA:  Right lower quadrant and right upper quadrant abdominal pain. History of NASH cirrhosis, hepatic encephalopathy, short gut syndrome, chronic diarrhea, stage IIIA chronic kidney disease, chronic anemia and hypothyroidism. Vaginal bleeding due to known fibroids. Altered mental status. EXAM: CT ABDOMEN AND PELVIS WITHOUT CONTRAST TECHNIQUE: Multidetector CT imaging of the abdomen and pelvis was performed following the standard protocol without IV contrast. RADIATION DOSE REDUCTION: This exam was performed according to the departmental dose-optimization program which includes automated exposure control, adjustment of the mA and/or kV according to patient size and/or use of iterative reconstruction technique. COMPARISON:  Chest, abdomen and pelvis CT dated 06/28/2023 FINDINGS: Lower chest: Mildly enlarged heart. Mild linear atelectasis/scarring  at both lung bases. Hepatobiliary: No focal liver abnormality is seen. Lateral segment left lobe and caudate lobe are enlarged. Status post cholecystectomy. No biliary dilatation. Pancreas: Unremarkable. No pancreatic ductal dilatation or surrounding inflammatory changes. Spleen: Normal in size without focal abnormality. Adrenals/Urinary Tract: Unremarkable adrenal glands. Stable small bilateral renal calculi. The largest is on the right, measuring 4 mm. Poorly distended urinary bladder. Unremarkable ureters. Stomach/Bowel: Unremarkable stomach, small bowel and colon. The appendix is not visualized. No secondary signs of appendicitis. Vascular/Lymphatic: Dilated inferior vena cava, iliac veins and femoral veins. Multiple dilated abdominal and  retroperitoneal varices. Atheromatous arterial calcifications without aneurysm. No enlarged lymph nodes. Reproductive: Multiple densely calcified uterine fibroids are again demonstrated. No adnexal masses. Other: Fat containing left lower pelvic paramedian ventral hernia with resolution of previously seen fluid in the hernia. Musculoskeletal: Stable lumbar and lower thoracic spine degenerative changes and T12 kyphoplasty. IMPRESSION: 1. No acute abnormality. 2. Stable small bilateral renal calculi. 3. Stable changes of cirrhosis of the liver with multiple dilated abdominal and retroperitoneal varices. 4. Stable dilated inferior vena cava, iliac veins and femoral veins. 5. Stable calcified uterine fibroids. 6. Fat containing left lower pelvic paramedian ventral hernia with resolution of previously seen fluid in the hernia. Electronically Signed   By: Elspeth Bathe M.D.   On: 08/11/2023 16:50   DG Abd Portable 1V Result Date: 08/10/2023 EXAM: 1 VIEW XRAY OF THE ABDOMEN 08/10/2023 03:23:00 PM COMPARISON: None available. CLINICAL HISTORY: [id number] Abdominal pain [id number]. Abdominal pain FINDINGS: BOWEL: Upper abdomen excluded. Stomach and small bowel nondistended. Scattered gas in the nondilated colon. SOFT TISSUES: Cholecystectomy clip. Calcified uterine fibroids. Pelvic Phleboliths. Splenic arterial calcifications. BONES: Cement   augmentation T12. IMPRESSION: 1. No acute findings. Electronically signed by: Katheleen Faes MD 08/10/2023 03:49 PM EDT RP Workstation: [id number]   CT Head Wo Contrast Result Date: 08/08/2023 CLINICAL DATA:  Mental status change, unknown cause EXAM: CT HEAD WITHOUT CONTRAST TECHNIQUE: Contiguous axial images were obtained from the base of the skull through the vertex without intravenous contrast. RADIATION DOSE REDUCTION: This exam was performed according to the departmental dose-optimization program which includes automated exposure control, adjustment of the mA and/or kV according to  patient size and/or use of iterative reconstruction technique. COMPARISON:  June 26, 2023 FINDINGS: Brain: The ventricles appear age appropriate. No mass effect or midline shift. Gray-white differentiation is preserved.Periventricular and subcortical white matter hypoattenuation, most consistent with changes of moderate chronic ischemic microvascular disease.No evidence of acute territorial infarction, extra-axial fluid collection, hemorrhage, or mass lesion. The basilar cisterns are patent without downward herniation. The cerebellar hemispheres and vermis are well formed without mass lesion or focal attenuation abnormality. Vascular: No hyperdense vessel. Calcified atherosclerotic plaque within the cavernous/supraclinoid ICA and intradural vertebral arteries. Skull: Normal. Negative for fracture or focal lesion. Sinuses/Orbits: The paranasal sinuses and mastoids are clear.The globes appear intact. No retrobulbar hematoma.Left lens replacement. Other: None. IMPRESSION: 1. No acute intracranial abnormality, specifically, no acute hemorrhage, territorial infarction, or intracranial mass. 2. Cerebral volume loss with sequelae of chronic ischemic microvascular disease. Electronically Signed   By: Rogelia Myers M.D.   On: 08/08/2023 15:55   DG Chest Portable 1 View Result Date: 08/08/2023 CLINICAL DATA:  ?pna.  Altered mental status. EXAM: PORTABLE CHEST 1 VIEW COMPARISON:  07/11/2023. FINDINGS: Low lung volume. Bilateral lung fields are clear. Bilateral costophrenic angles are clear. Normal cardio-mediastinal silhouette. Aortic arch calcifications noted. No acute osseous abnormalities. The soft tissues are within normal limits. IMPRESSION: No active disease. Electronically  Signed   By: Ree Molt M.D.   On: 08/08/2023 12:58    Microbiology: Results for orders placed or performed during the hospital encounter of 08/24/23  Resp panel by RT-PCR (RSV, Flu A&B, Covid) Urine, Clean Catch     Status: None    Collection Time: 08/24/23  5:07 PM   Specimen: Urine, Clean Catch; Nasal Swab  Result Value Ref Range Status   SARS Coronavirus 2 by RT PCR NEGATIVE NEGATIVE Final    Comment: (NOTE) SARS-CoV-2 target nucleic acids are NOT DETECTED.  The SARS-CoV-2 RNA is generally detectable in upper respiratory specimens during the acute phase of infection. The lowest concentration of SARS-CoV-2 viral copies this assay can detect is 138 copies/mL. A negative result does not preclude SARS-Cov-2 infection and should not be used as the sole basis for treatment or other patient management decisions. A negative result may occur with  improper specimen collection/handling, submission of specimen other than nasopharyngeal swab, presence of viral mutation(s) within the areas targeted by this assay, and inadequate number of viral copies(<138 copies/mL). A negative result must be combined with clinical observations, patient history, and epidemiological information. The expected result is Negative.  Fact Sheet for Patients:  BloggerCourse.com  Fact Sheet for Healthcare Providers:  SeriousBroker.it  This test is no t yet approved or cleared by the [place]  FDA and  has been authorized for detection and/or diagnosis of SARS-CoV-2 by FDA under an Emergency Use Authorization (EUA). This EUA will remain  in effect (meaning this test can be used) for the duration of the COVID-19 declaration under Section 564(b)(1) of the Act, 21 U.S.C.section 360bbb-3(b)(1), unless the authorization is terminated  or revoked sooner.       Influenza A by PCR NEGATIVE NEGATIVE Final   Influenza B by PCR NEGATIVE NEGATIVE Final    Comment: (NOTE) The Xpert Xpress SARS-CoV-2/FLU/RSV plus assay is intended as an aid in the diagnosis of influenza from Nasopharyngeal swab specimens and should not be used as a sole basis for treatment. Nasal washings and aspirates are  unacceptable for Xpert Xpress SARS-CoV-2/FLU/RSV testing.  Fact Sheet for Patients: BloggerCourse.com  Fact Sheet for Healthcare Providers: SeriousBroker.it  This test is not yet approved or cleared by the [place]  FDA and has been authorized for detection and/or diagnosis of SARS-CoV-2 by FDA under an Emergency Use Authorization (EUA). This EUA will remain in effect (meaning this test can be used) for the duration of the COVID-19 declaration under Section 564(b)(1) of the Act, 21 U.S.C. section 360bbb-3(b)(1), unless the authorization is terminated or revoked.     Resp Syncytial Virus by PCR NEGATIVE NEGATIVE Final    Comment: (NOTE) Fact Sheet for Patients: BloggerCourse.com  Fact Sheet for Healthcare Providers: SeriousBroker.it  This test is not yet approved or cleared by the [place]  FDA and has been authorized for detection and/or diagnosis of SARS-CoV-2 by FDA under an Emergency Use Authorization (EUA). This EUA will remain in effect (meaning this test can be used) for the duration of the COVID-19 declaration under Section 564(b)(1) of the Act, 21 U.S.C. section 360bbb-3(b)(1), unless the authorization is terminated or revoked.  Performed at [facility], [address] [place], [place] [address]     Labs: CBC: Recent Labs  Lab 08/24/23 1544 08/25/23 0036 08/26/23 0517  WBC 6.3 5.2 5.2  HGB 10.9* 10.0* 10.6*  HCT 36.0 31.3* 34.6*  MCV 100.6* 96.9 99.4  PLT 134* 112* 131*   Basic Metabolic Panel: Recent Labs  Lab 08/25/23 0036 08/26/23 0517 08/27/23 0512 08/28/23 0947 08/29/23 0530  NA 139 140 135 138 140  K 3.5 4.0 3.4* 3.7 3.3*  CL 114* 115* 112* 115* 120*  CO2 19* 20* 18* 16* 15*  GLUCOSE 122* 114* 86 115* 158*  BUN 32* 29* 23 20 19   CREATININE 0.96 1.04* 0.83 0.85 0.83  CALCIUM  8.2* 8.5* 8.2* 8.6* 8.5*   Liver  Function Tests: Recent Labs  Lab 08/24/23 1544 08/26/23 0517 08/27/23 0512 08/28/23 0947 08/29/23 0530  AST 25 65* 54* 43* 31  ALT 20 44 42 37 31  ALKPHOS 112 116 118 120 119  BILITOT 1.6* 1.4* 1.5* 1.6* 1.1  PROT 6.0* 5.4* 5.5* 5.5* 5.2*  ALBUMIN  2.7* 2.4* 2.4* 2.5* 2.2*   CBG: No results for input(s): GLUCAP in the last 168 hours.  Discharge time spent: greater than 30 minutes.  Signed: Toribio Door, MD [facility] 08/29/2023

## 2023-08-30 ENCOUNTER — Telehealth: Payer: Self-pay | Admitting: *Deleted

## 2023-08-30 NOTE — Transitions of Care (Post Inpatient/ED Visit) (Signed)
 08/30/2023  Name: [name] MRN: [id number] DOB: 15-Jan-1955  Today's TOC FU Call Status: Today's TOC FU Call Status:: Successful TOC FU Call Completed TOC FU Call Complete Date: 08/30/23 Patient's Name and Date of Birth confirmed.  Transition Care Management Follow-up Telephone Call Date of Discharge: 08/29/23 Discharge Facility: [facility]) Type of Discharge: Inpatient Admission Primary Inpatient Discharge Diagnosis:: Acute hepatic encephalopathy How have you been since you were released from the hospital?: Better (eating, drinking well, having several bowel movements throughout the day with lactulose ) Any questions or concerns?: No  Items Reviewed: Did you receive and understand the discharge instructions provided?: No Any new allergies since your discharge?: No Dietary orders reviewed?: Yes Type of Diet Ordered:: low sodium   heart healthy Do you have support at home?: Yes People in Home [RPT]: child(ren), adult Name of Support/Comfort Primary Source: [name]   son  Medications Reviewed Today: Medications Reviewed Today     Reviewed by Aura Mliss LABOR, RN (Registered Nurse) on 08/30/23 at 1149  Med List Status: <None>   Medication Order Taking? Sig Documenting Provider Last Dose Status Informant  albuterol  (VENTOLIN  HFA) 108 (90 Base) MCG/ACT inhaler [id number]  Inhale 2 puffs into the lungs every 6 (six) hours as needed for shortness of breath or wheezing.  Patient not taking: Reported on 08/30/2023   [provider]  Active Child  atorvastatin  (LIPITOR) 40 MG tablet [id number] Yes Take 1 tablet (40 mg total) by mouth in the morning. [name], MD  Active Child  colchicine  0.6 MG tablet [id number] Yes Take 1 tablet (0.6 mg total) by mouth 2 (two) times daily. Jadine Toribio SQUIBB, MD  Active   copper  tablet [id number] Yes Take 2 mg by mouth in the morning and at bedtime. [provider]  Active Child  cyanocobalamin  (VITAMIN B12) 1000 MCG tablet  [id number] Yes Take 2,000 mcg by mouth every Monday, Wednesday, and Friday. [provider]  Active Child  dicyclomine  (BENTYL ) 10 MG capsule [id number] Yes Take 1 capsule (10 mg total) by mouth 3 (three) times daily as needed for spasms (abdominal cramping). [name], MD  Active Child  EPINEPHrine  0.3 mg/0.3 mL IJ SOAJ injection [id number]  Inject 0.3 mg into the muscle as needed for anaphylaxis.  Patient not taking: Reported on 08/30/2023   [name], MD  Active Child  famotidine  (PEPCID ) 20 MG tablet [id number] Yes Take 20 mg by mouth daily as needed for heartburn or indigestion. [provider]  Active Child  folic acid  (FOLVITE ) 1 MG tablet [id number] Yes Take 1 tablet (1 mg total) by mouth daily. Rizwan, [name], MD  Active Child  furosemide  (LASIX ) 40 MG tablet [id number] Yes Take 1 tablet (40 mg total) by mouth 2 (two) times daily. [name], MD  Active Child  HYDROcodone -acetaminophen  [facility]) 10-325 MG tablet [id number] Yes Take 1 tablet by mouth every 8 (eight) hours as needed for moderate pain (pain score 4-6). [provider]  Active Child  IRON -VITAMIN C  PO [id number] Yes Take 1 tablet by mouth daily with breakfast. [provider]  Active Child  lactulose  (CHRONULAC ) 10 GM/15ML solution [id number] Yes Take 45 mLs (30 g total) by mouth 3 (three) times daily. Take to ensure that there is 2 loose bowel movement EVERY DAY Cheryle Page, MD  Active Child  levothyroxine  (SYNTHROID ) 175 MCG tablet [id number] Yes Take 1 tablet (175 mcg total) by mouth daily at 6 (six) AM. Recheck TSH with PCP in 4 weeks [name], MD  Active  Child  lidocaine  (LIDODERM ) 5 % [id number]  Place 1 patch onto the skin daily. Remove & Discard patch within 12 hours or as directed by MD  Patient not taking: Reported on 08/30/2023   Tobie Yetta HERO, MD  Active Child           Med Note [name]   Sat Dec 10, 2022 11:45 AM)    magnesium  gluconate (MAGONATE) 500  (27 Mg) MG TABS tablet [id number] Yes Take 1 tablet (500 mg total) by mouth in the morning and at bedtime. [name], MD  Active Child  meclizine  (ANTIVERT ) 25 MG tablet [id number] Yes Take 1 tablet (25 mg total) by mouth 3 (three) times daily as needed for dizziness. Cheryle Page, MD  Active Child  megestrol  (MEGACE ) 40 MG tablet [id number] Yes Take 1 tablet (40 mg total) by mouth 2 (two) times daily. Ajewole, [name], MD  Active Child  methocarbamol  (ROBAXIN ) 500 MG tablet [id number] Yes Take 500 mg by mouth in the morning. [provider]  Active Child  midodrine  (PROAMATINE ) 10 MG tablet [id number] Yes TAKE 1 TABLET(10 MG) BY MOUTH THREE TIMES DAILY WITH MEALS [name], MD  Active Child  Multiple Vitamins-Minerals (CENTRUM SILVER ULTRA WOMENS PO) [id number] Yes Take 1 tablet by mouth in the morning. [provider]  Active Child  naloxone [facility]) nasal spray 4 mg/0.1 mL [id number] Yes Place 1 spray into the nose as needed (accidental overdose). [provider]  Active Child           Med Note ROLENE STALLION, [place] A   Wed Mar 08, 2023  2:39 PM)    nystatin  cream (MYCOSTATIN ) [id number] Yes Apply 1 Application topically 2 (two) times daily as needed for dry skin. Cheryle Page, MD  Active Child  pantoprazole  (PROTONIX ) 40 MG tablet [id number] Yes Take 1 tablet (40 mg total) by mouth 2 (two) times daily before a meal. [name], MD  Active Child  potassium chloride  (KLOR-CON ) 10 MEQ tablet [id number] Yes Take 2 tablets (20 mEq total) by mouth 2 (two) times daily. [name], MD  Active Child  rifaximin  (XIFAXAN ) 550 MG TABS tablet [id number] Yes Take 550 mg by mouth 2 (two) times daily. [provider]  Active Child  rOPINIRole  (REQUIP ) 0.25 MG tablet [id number] Yes TAKE 1 TABLET(0.25 MG) BY MOUTH AT BEDTIME [name], MD  Active Child  senna-docusate (SENOKOT-S) 8.6-50 MG tablet [id number] Yes TAKE 1 TABLET BY MOUTH TWICE DAILY [name], MD  Active Child  spironolactone  (ALDACTONE ) 50 MG tablet [id number] Yes TAKE 1 TABLET(50 MG) BY MOUTH DAILY [name], MD  Active Child  triamcinolone  cream (KENALOG ) 0.1 % [id number] Yes Apply 1 Application topically 2 (two) times daily as needed (for itching- affected areas). [provider]  Active Child  venlafaxine  XR (EFFEXOR -XR) 75 MG 24 hr capsule [id number] Yes Take 1 capsule (75 mg total) by mouth daily with breakfast. [name], MD  Active Child  Vitamin D , Ergocalciferol , (DRISDOL) 50000 UNITS CAPS [id number] Yes Take 50,000 Units by mouth every Wednesday. [provider]  Active Child  Med List Note ROLENE STALLION Devere DELENA Bishop 03/08/23 1450): Pt's son Rei Contee helps with medications            Home Care and Equipment/Supplies: Were Home Health Services Ordered?: No Any new equipment or medical supplies ordered?: No  Functional Questionnaire: Do you need assistance with bathing/showering or dressing?: Yes (has walk in shower,  shower seat,  grab bars,  son assists if needed) Do you need assistance with meal preparation?: Yes (son assists) Do you need assistance with eating?: No Do you have difficulty maintaining continence: No Do you need assistance with getting out of bed/getting out of a chair/moving?: Yes (walker, hospital bed,  lift chair) Do you have difficulty managing or taking your medications?: Yes (son provides oversight)  Follow up appointments reviewed: PCP Follow-up appointment confirmed?: Yes MD Provider Line Number:[phone] Given: No Date of PCP follow-up appointment?: 09/04/23 Follow-up Provider: Connie Emperor  @ 320 pm Specialist Hospital Follow-up appointment confirmed?: No (son has # for GI and will make appointment) Reason Specialist Follow-Up Not Confirmed: Patient has Specialist Provider Number and will Call for Appointment Do you need transportation to your follow-up appointment?: No Do you  understand care options if your condition(s) worsen?: Yes-patient verbalized understanding  SDOH Interventions Today    Flowsheet Row Most Recent Value  SDOH Interventions   Food Insecurity Interventions Intervention Not Indicated  Housing Interventions Intervention Not Indicated  Transportation Interventions Intervention Not Indicated  Utilities Interventions Intervention Not Indicated    Goals Addressed                         [facility] Transitions of Care (TOC) Care Plan       Problems:  Recent Hospitalization for treatment of hepatic encephalopathy/ cirrhosis Spoke with DPR, patient's son [name] who reports patient  is feeling okay now since ammonia levels came down, reports compliance with lactulose  and pt having at least 6 bowel movements per day  Goal:  Over the next 30 days, the patient will not experience hospital readmission  Interventions:  Transitions of Care: Doctor Visits  - discussed the importance of doctor visits Reviewed Signs and symptoms of infection Evaluation of current treatment plan related to acute hepatic encephalopathy, cirrhosis, self-management and patient's adherence to plan as established by provider. Discussed plans with patient for ongoing care management follow up and provided patient with direct contact information for care management team Evaluation of current treatment plan related to cirrhosis  and patient's adherence to plan as established by provider Reviewed medications with patient and discussed importance of taking all medications as prescribed.  Reinforced compliance with lactulose  and importance of regular bowel movements to keep ammonia levels in check. Reviewed scheduled/upcoming provider appointments including primary care provider 09/04/23 @ 320 pm Advised patient/ son to discuss frequent readmission with provider Encouraged son to assist to follow low salt diet since he provides meals Reviewed importance of daily weight and keeping a  log Reviewed importance of adequate protein in diet  Reviewed bleeding risk due to cirrhosis Encouraged son to observe for any signs of infection, confusion and get medical treatment for infections asap Reviewed plan of care with son [name], enrolled in [facility] 30 day program, will continue to follow   Patient Self Care Activities:  Attend all scheduled provider appointments Attend church or other social activities Call pharmacy for medication refills 3-7 days in advance of running out of medications Call provider office for new concerns or questions  Notify RN Care Manager of TOC call rescheduling needs Participate in Transition of Care Program/Attend TOC scheduled calls Perform all self care activities independently  Take medications as prescribed   Work with the social worker to address care coordination needs and will continue to work with the clinical team to address health care and disease management related needs Do not skip lactulose  Provide adequate protein in diet to help with  cirrhosis Get emergency assistance for bleeding- rectal blood, dark black stools or vomiting of blood  Plan:  The patient has been provided with contact information for the care management team and has been advised to call with any health related questions or concerns.  Telephone outreach - 09/06/23 @ 215 pm        Mliss Creed [facility], BSN RN Care Manager/ Transition of Care [place] [phone]

## 2023-09-04 ENCOUNTER — Encounter: Payer: Self-pay | Admitting: Family Medicine

## 2023-09-04 ENCOUNTER — Telehealth: Payer: Self-pay

## 2023-09-04 ENCOUNTER — Ambulatory Visit: Payer: Medicare (Managed Care) | Admitting: Family Medicine

## 2023-09-04 ENCOUNTER — Ambulatory Visit: Payer: Medicare (Managed Care) | Admitting: Obstetrics and Gynecology

## 2023-09-04 VITALS — BP 118/74 | HR 79 | Temp 98.7°F | Wt 203.0 lb

## 2023-09-04 DIAGNOSIS — D649 Anemia, unspecified: Secondary | ICD-10-CM

## 2023-09-04 DIAGNOSIS — E876 Hypokalemia: Secondary | ICD-10-CM

## 2023-09-04 DIAGNOSIS — R600 Localized edema: Secondary | ICD-10-CM | POA: Diagnosis not present

## 2023-09-04 DIAGNOSIS — R059 Cough, unspecified: Secondary | ICD-10-CM

## 2023-09-04 DIAGNOSIS — K7460 Unspecified cirrhosis of liver: Secondary | ICD-10-CM | POA: Diagnosis not present

## 2023-09-04 DIAGNOSIS — K7581 Nonalcoholic steatohepatitis (NASH): Secondary | ICD-10-CM

## 2023-09-04 DIAGNOSIS — K7682 Hepatic encephalopathy: Secondary | ICD-10-CM | POA: Diagnosis not present

## 2023-09-04 DIAGNOSIS — K439 Ventral hernia without obstruction or gangrene: Secondary | ICD-10-CM

## 2023-09-04 MED ORDER — BENZONATATE 100 MG PO CAPS: 100.0000 mg | ORAL_CAPSULE | Freq: Three times a day (TID) | ORAL | 1 refills | Status: DC | PRN

## 2023-09-04 NOTE — Telephone Encounter (Signed)
 Copied from CRM #[id number]. Topic: General - Other >> Aug 31, 2023  1:20 PM Wess RAMAN wrote: Reason for CRM: Deane from [facility] wanted to let Dr. Aletha know that patient was discharged with home health orders. When she arrived at the home, patient's son declined services.   Callback #: [phone]

## 2023-09-04 NOTE — Progress Notes (Unsigned)
 Patient Office Visit  Assessment & Plan:  Acute hepatic encephalopathy (HCC) -     CBC with Differential/Platelet -     Comprehensive metabolic panel with GFR -     Ammonia  Edema of both lower legs -     CBC with Differential/Platelet -     Comprehensive metabolic panel with GFR  Hypokalemia -     Comprehensive metabolic panel with GFR  Cough, unspecified type -     Benzonatate ; Take 1 capsule (100 mg total) by mouth 3 (three) times daily as needed.  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 Ammonia levels fluctuate, sometimes correlating with confusion. Lactulose  discussed, concerns about diarrhea and dehydration noted. - Monitor ammonia levels as needed. - Discuss lactulose  administration with GI specialist.  Short bowel syndrome with chronic diarrhea Chronic diarrhea due to short bowel syndrome. - Discuss lactulose  dosing with GI specialist to manage diarrhea.  Uterine fibroids with abnormal uterine bleeding followed by GYN Uterine fibroids causing abnormal bleeding. Moxeza controls bleeding, but it resumes if stopped. - Continue Moxeza twice daily. - Follow up with gynecologist for further evaluation of bleeding.  Large abdominal hernia Large abdominal hernia stable, surgical intervention too risky. - Monitor hernia for changes or complications.  Heart murmur Chronic heart murmur, unchanged, possibly more audible due to weight loss.  Viral upper respiratory infection with ear pain and sore throat Viral upper respiratory infection with ear pain, sore throat, and cough. Negative for COVID-19, flu, and RSV. Cough subsided with Mucinex . - Continue Mucinex   for congestion. - Prescribe Tessalon  Perles for cough.  Chronic cough with congestion Chronic cough with congestion, possibly related to recent viral infection. Cough decreased with Mucinex . - Continue Mucinex  for congestion. - Prescribe Tessalon  Perles for cough.      Test results were reviewed and analyzed as part of the medical decision making of this visit.  [facility] hospitalization and discharge summary during the office visit fair.  Follow-up on lab work notify patient.  Patient can use Mucinex  over-the-counter and Tessalon  Perles as needed.  If no improvement or worsening symptoms she is to notify us .    No follow-ups on file.   Subjective:    Patient ID: [name], female    DOB: [birth year]  Age: 68 y.o. MRN: [id number]  Chief Complaint  Patient presents with   Hospitalization Follow-up   Cough    Pt son states that pt has been coughing and having drainage.     Cough   Discussed the use of AI scribe software for clinical note transcription with the patient, who gave verbal consent to proceed.  History of Present Illness        [name] is a 68 year old female who presents for a follow-up regarding her transition of care program and hospital follow up. Patient was discharged August 19th from [facility]   She has been hospitalized nine times this year, which has led to the initiation of a transition of care program to reduce hospital admissions. She is in contact with Mliss Creed RN, who will call weekly for four weeks to monitor her condition.  She has a history  of elevated ammonia levels, with recent measurements ranging from 59 to 185. Despite these high levels, her mental status does not always correlate with ammonia levels.  She experiences frequent diarrhea, exacerbated by lactulose , which she takes three times a day. This results in excessive bowel movements, leading to dehydration concerns. She has short gut syndrome, contributing to her  baseline of five to seven bowel movements daily.  She has a history of vaginal bleeding, attributed to fibroids and possibly exacerbated by catheter use. She was prescribed Moxeza twice daily to manage bleeding, but cessation of the medication leads to bleeding resumption. She has an upcoming gynecological appointment to further evaluate this issue.  She reports recent symptoms of earache, sore throat, and cough, with no fever. She has been taking Mucinex  for sinus congestion, which has helped reduce coughing.  She has a history of cirrhosis, with stable findings on recent imaging. She also has a hernia, referred to as 'the alien,' which has  been surgically addressed due to potential complications. Dr. Ebbie general surgeon saw her in the hospital. She has a history of kidney stones and a stable lung nodule.  She is planning to attend a [place]  concert in September as a birthday gift with her son.  Physical Exam VITALS: SaO2- 99% HEENT: Cerumen present in ear canal. Tympanic membrane not erythematous. CHEST: Crackles present in left lung base. CARDIOVASCULAR: Heart murmur present. 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 Ammonia levels fluctuate, sometimes correlating with confusion. Lactulose  discussed, concerns about diarrhea and dehydration noted. - Monitor ammonia levels as needed. - Discuss lactulose  administration with GI specialist.  Short bowel syndrome with chronic diarrhea Chronic diarrhea due to short bowel syndrome. - Discuss  lactulose  dosing with GI specialist to manage diarrhea.  Uterine fibroids with abnormal uterine bleeding followed by GYN Uterine fibroids causing abnormal bleeding. Moxeza controls bleeding, but it resumes if stopped. - Continue Moxeza twice daily. - Follow up with gynecologist for further evaluation of bleeding.  Large abdominal hernia Large abdominal hernia stable, surgical intervention too risky. - Monitor hernia for changes or complications.  Heart murmur Chronic heart murmur, unchanged, possibly more audible due to weight loss.  Viral upper respiratory infection with ear pain and sore throat Viral upper respiratory infection with ear pain, sore throat, and cough. Negative for COVID-19, flu, and RSV. Cough subsided with Mucinex . - Continue Mucinex  for congestion. - Prescribe Tessalon  Perles for cough.  Chronic cough with congestion Chronic cough with congestion, possibly related to recent viral infection. Cough decreased with Mucinex . - Continue Mucinex  for congestion. - Prescribe Tessalon  Perles for cough.    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   pt stated on admission history review   Corneal perforation of left eye 04/18/2022   Depression, major, recurrent, moderate (HCC) 09/10/2018   DVT (deep venous thrombosis) (HCC) 01/10/1997  left      left     Heart murmur    Hepatic encephalopathy (HCC) 12/10/2022   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   Hyperammonemia (HCC) 09/30/2022   Hypertension    Kidney stone    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   Obesity    Pulmonary emboli (HCC)  01/10/2009   Pulmonary embolism (HCC)    Short gut syndrome    Thyroid  disease    Past Surgical History:  Procedure Laterality Date   ABDOMINAL WALL MESH  REMOVAL     2016   CESAREAN SECTION WITH BILATERAL TUBAL LIGATION  04/30/1985   CHOLECYSTECTOMY  05/2007   Exploratory laparotomy, lysis of adhesions, takedown of ileostomy with small bowel resection, open cholecystectomy and ileocolostomy   COLON SURGERY  01/2007   exploratory laparotomy with right colecotmy and end ileostomy   COLONOSCOPY  04/02/2019   Dr Gunnar Chris Daniels, MD [facility]   ESOPHAGOGASTRODUODENOSCOPY (EGD) WITH PROPOFOL  N/A 10/19/2022   Procedure: ESOPHAGOGASTRODUODENOSCOPY (EGD) WITH PROPOFOL ;  Surgeon: Saintclair Jasper, MD;  Location: [facility];  Service: Gastroenterology;  Laterality: N/A;   ESOPHAGOSCOPY  04/02/2019   EYE SURGERY Left 10/06/2020   Cataract extraciton w/intraocular lens implant- Dr Caresse   HERNIA REPAIR  10/2007   ILEOSTOMY  2009   states bowel perfotation wih colonsocopy   ILEOSTOMY CLOSURE  2009   INCISIONAL HERNIA REPAIR  10/11/2018   IR KYPHO THORACIC WITH BONE BIOPSY  08/31/2022   IR KYPHO THORACIC WITH BONE BIOPSY  10/27/2022   IR RADIOLOGIST EVAL & MGMT  09/27/2022   KIDNEY STONE SURGERY     LIVER RESECTION     2009   PANNICULECTOMY     repair of abdominal wall hernia   WRIST SURGERY     tendonitis   Social History   Tobacco Use   Smoking status: Former    Current packs/day: 0.00    Types: Cigarettes    Quit date: 1987    Years since quitting: 38.6    Passive exposure: Past   Smokeless tobacco: Never  Vaping Use   Vaping status: Never Used  Substance Use Topics   Alcohol use: No   Drug use: No   Family History  Problem Relation Age of Onset   Uterine cancer Mother    Bone cancer Father    Heart disease Father    Hyperlipidemia Father    Dementia Father    Hypertension Father    Skin cancer Sister    Hypertension Sister    Diabetes Sister    Hyperlipidemia  Brother    Hypertension Brother    Heart disease Brother    Allergies  Allergen Reactions   Ace Inhibitors Anaphylaxis and Swelling   Gadolinium Derivatives Anaphylaxis   Ivp Dye [Iodinated Contrast Media] Anaphylaxis   Wound Dressing Adhesive Dermatitis   Desitin [Zinc  Oxide] Rash and Other (See Comments)    Worsens rash   Yellow Jacket Venom Swelling   Chlorhexidine  Rash and Other (See Comments)    WORSENS RASHES   Doxycycline Rash   Penicillins Dermatitis and Rash   Zofran  [Ondansetron ] Rash    Review of Systems  Respiratory:  Positive for cough.       Objective:    BP 118/74   Pulse 79   Temp 98.7 F (37.1 C)   Wt 203 lb (92.1 kg)   SpO2 99%   BMI 37.13 kg/m  BP Readings from Last 3 Encounters:  09/04/23  118/74  08/29/23 122/72  08/13/23 (!) 141/66   Wt Readings from Last 3 Encounters:  09/04/23 203 lb (92.1 kg)  08/25/23 210 lb 9.6 oz (95.5 kg)  08/08/23 195 lb 15.8 oz (88.9 kg)    Physical Exam Vitals and nursing note reviewed.  Constitutional:      General: She is not in acute distress.    Appearance: Normal appearance.     Comments: Is in wheelchair, comes in with her son  HENT:     Head: Normocephalic.     Right Ear: Tympanic membrane, ear canal and external ear normal.     Left Ear: Tympanic membrane, ear canal and external ear normal.  Eyes:     Extraocular Movements: Extraocular movements intact.     Conjunctiva/sclera: Conjunctivae normal.     Pupils: Pupils are equal, round, and reactive to light.  Cardiovascular:     Rate and Rhythm: Normal rate and regular rhythm.     Heart sounds: Murmur heard.  Pulmonary:     Effort: Pulmonary effort is normal.     Breath sounds: Normal breath sounds. No wheezing.     Comments: Has coarse crackles left lower lobe (previously heard) Musculoskeletal:     Right lower leg: No edema.     Left lower leg: No edema.  Neurological:     General: No focal deficit present.     Mental Status: She is alert  and oriented to person, place, and time.  Psychiatric:        Mood and Affect: Mood normal.        Behavior: Behavior normal.        Thought Content: Thought content normal.        Judgment: Judgment normal.      No results found for any visits on 09/04/23.

## 2023-09-05 ENCOUNTER — Ambulatory Visit: Payer: Self-pay | Admitting: Family Medicine

## 2023-09-05 ENCOUNTER — Encounter: Payer: Self-pay | Admitting: Family Medicine

## 2023-09-05 LAB — CBC WITH DIFFERENTIAL/PLATELET
Absolute Lymphocytes: 1617 {cells}/uL (ref 850–3900)
Absolute Monocytes: 785 {cells}/uL (ref 200–950)
Basophils Absolute: 31 {cells}/uL (ref 0–200)
Basophils Relative: 0.6 %
Eosinophils Absolute: 400 {cells}/uL (ref 15–500)
Eosinophils Relative: 7.7 %
HCT: 38.8 % (ref 35.0–45.0)
Hemoglobin: 12.3 g/dL (ref 11.7–15.5)
MCH: 30.4 pg (ref 27.0–33.0)
MCHC: 31.7 g/dL — ABNORMAL LOW (ref 32.0–36.0)
MCV: 95.8 fL (ref 80.0–100.0)
MPV: 10.1 fL (ref 7.5–12.5)
Monocytes Relative: 15.1 %
Neutro Abs: 2366 {cells}/uL (ref 1500–7800)
Neutrophils Relative %: 45.5 %
Platelets: 127 Thousand/uL — ABNORMAL LOW (ref 140–400)
RBC: 4.05 Million/uL (ref 3.80–5.10)
RDW: 14.2 % (ref 11.0–15.0)
Total Lymphocyte: 31.1 %
WBC: 5.2 Thousand/uL (ref 3.8–10.8)

## 2023-09-05 LAB — COMPREHENSIVE METABOLIC PANEL WITH GFR
AG Ratio: 1.1 (calc) (ref 1.0–2.5)
ALT: 26 U/L (ref 6–29)
AST: 29 U/L (ref 10–35)
Albumin: 2.8 g/dL — ABNORMAL LOW (ref 3.6–5.1)
Alkaline phosphatase (APISO): 136 U/L (ref 37–153)
BUN: 20 mg/dL (ref 7–25)
CO2: 21 mmol/L (ref 20–32)
Calcium: 8.1 mg/dL — ABNORMAL LOW (ref 8.6–10.4)
Chloride: 111 mmol/L — ABNORMAL HIGH (ref 98–110)
Creat: 0.97 mg/dL (ref 0.50–1.05)
Globulin: 2.5 g/dL (ref 1.9–3.7)
Glucose, Bld: 101 mg/dL — ABNORMAL HIGH (ref 65–99)
Potassium: 3.5 mmol/L (ref 3.5–5.3)
Sodium: 137 mmol/L (ref 135–146)
Total Bilirubin: 1.2 mg/dL (ref 0.2–1.2)
Total Protein: 5.3 g/dL — ABNORMAL LOW (ref 6.1–8.1)
eGFR: 65 mL/min/1.73m2 (ref >=60)

## 2023-09-05 LAB — AMMONIA: Ammonia: 111 umol/L — ABNORMAL HIGH (ref <=72)

## 2023-09-06 ENCOUNTER — Telehealth: Payer: Medicare (Managed Care) | Admitting: *Deleted

## 2023-09-06 ENCOUNTER — Encounter: Payer: Self-pay | Admitting: *Deleted

## 2023-09-06 DIAGNOSIS — M5416 Radiculopathy, lumbar region: Secondary | ICD-10-CM | POA: Diagnosis not present

## 2023-09-06 DIAGNOSIS — Z79899 Other long term (current) drug therapy: Secondary | ICD-10-CM | POA: Diagnosis not present

## 2023-09-06 DIAGNOSIS — M5412 Radiculopathy, cervical region: Secondary | ICD-10-CM | POA: Diagnosis not present

## 2023-09-07 ENCOUNTER — Telehealth: Payer: Self-pay | Admitting: *Deleted

## 2023-09-07 NOTE — Patient Instructions (Signed)
 Visit Information  Thank you for taking time to visit with me today. Please don't hesitate to contact me if I can be of assistance to you before our next scheduled telephone appointment.  Following are the goals we discussed today:   Goals Addressed             This Visit's Progress    VBCI Transitions of Care (TOC) Care Plan       Problems:  Recent Hospitalization for treatment of hepatic encephalopathy/ cirrhosis Spoke with DPR, patient's son [name] who reports patient  is feeling okay now since ammonia levels came down, reports compliance with lactulose  and pt having at least 6 bowel movements per day 09/07/23- no new concerns reported today, continues using lactulose  and having 5-6 bowel movements per day  Goal:  Over the next 30 days, the patient will not experience hospital readmission  Interventions:  Transitions of Care: Doctor Visits  - discussed the importance of doctor visits Reviewed Signs and symptoms of infection Evaluation of current treatment plan related to acute hepatic encephalopathy, cirrhosis, self-management and patient's adherence to plan as established by provider. Discussed plans with patient for ongoing care management follow up and provided patient with direct contact information for care management team Evaluation of current treatment plan related to cirrhosis  and patient's adherence to plan as established by provider Reviewed medications with patient and discussed importance of taking all medications as prescribed.  Reinforced compliance with lactulose  and importance of regular bowel movements to keep ammonia levels in check. Reviewed scheduled/upcoming provider appointments Advised patient/ son to discuss frequent readmission with provider Encouraged son to assist to follow low salt diet since he provides meals Reviewed importance of daily weight and keeping a log Reinforced importance of adequate protein in diet  Reinforced bleeding risk due to  cirrhosis Encouraged son to observe for any signs of infection, confusion and get medical treatment for infections asap  Patient Self Care Activities:  Attend all scheduled provider appointments Attend church or other social activities Call pharmacy for medication refills 3-7 days in advance of running out of medications Call provider office for new concerns or questions  Notify RN Care Manager of TOC call rescheduling needs Participate in Transition of Care Program/Attend TOC scheduled calls Perform all self care activities independently  Take medications as prescribed   Work with the social worker to address care coordination needs and will continue to work with the clinical team to address health care and disease management related needs Do not skip lactulose  Provide adequate protein in diet to help with cirrhosis Get emergency assistance for bleeding- rectal blood, dark black stools or vomiting of blood  Plan:  The patient has been provided with contact information for the care management team and has been advised to call with any health related questions or concerns.  Telephone outreach - 09/14/23 @ 115 pm         Our next appointment is by telephone on 09/14/23 @ 115 pm  Please call the care guide team at [phone] if you need to cancel or reschedule your appointment.   If you are experiencing a Mental Health or Behavioral Health Crisis or need someone to talk to, please call the Suicide and Crisis Lifeline: 988 call the [place]  National Suicide Prevention Lifeline: 917-810-3049 or TTY: 218-370-0211 TTY (317) 476-5170) to talk to a trained counselor call 1-800-273-TALK (toll free, 24 hour hotline) call 911   Patient verbalizes understanding of instructions and care plan provided today and agrees to view  in MyChart. Active MyChart status and patient understanding of how to access instructions and care plan via MyChart confirmed with patient.     Telephone follow up appointment  with care management team member scheduled for:  09/14/23 @ 115 pm  Mliss Creed [facility], BSN RN Care Manager/ Transition of Care [place]/ [facility] [phone]

## 2023-09-07 NOTE — Patient Outreach (Signed)
 Transition of Care week 2  Visit Note  09/07/2023  Name: [name] MRN: [id number]          DOB: 06/04/1955  Situation: Patient enrolled in [facility] 30-day program. Visit completed with patient by telephone.   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   Hyperammonemia (HCC) 09/30/2022   Hypertension    Kidney stone    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   Obesity    Pulmonary emboli (HCC) 01/10/2009   Pulmonary embolism (HCC)    Short gut syndrome    Thyroid  disease     Assessment: Patient Reported Symptoms: Cognitive Cognitive Status: No symptoms reported, Alert and oriented to person, place, and time, Able to follow simple commands, Normal speech and language skills      Neurological Neurological Review of Symptoms: No symptoms reported    HEENT HEENT Symptoms Reported: Other: HEENT Comment: blind left eye    Cardiovascular Cardiovascular Symptoms Reported: No symptoms reported Does patient have uncontrolled Hypertension?: No    Respiratory Respiratory Symptoms Reported: No symptoms reported    Endocrine Endocrine Symptoms Reported: No symptoms reported Is patient diabetic?: No Endocrine Comment: hypothyroidism  Gastrointestinal Gastrointestinal Symptoms Reported: No symptoms reported Additional Gastrointestinal Details: pt is having at least 5-6 bowel movements per day, taking lactulose ,   cirrhosis Gastrointestinal  Management Strategies: Medication therapy Gastrointestinal Self-Management Outcome: 3 (uncertain) Gastrointestinal Comment: reinforced importance of taking lactulose  as prescribed to keep ammonia levels in check    Genitourinary Genitourinary Symptoms Reported: No symptoms reported    Integumentary Integumentary Symptoms Reported: No symptoms reported    Musculoskeletal Musculoskelatal Symptoms Reviewed: Limited mobility Additional Musculoskeletal Details: walker, lift chair,  WC ramp Musculoskeletal Management Strategies: Medical device Musculoskeletal Self-Management Outcome: 4 (good) Musculoskeletal Comment: reinforced safety precautions      Psychosocial Psychosocial Symptoms Reported: No symptoms reported         There were no vitals filed for this visit.  Medications Reviewed Today     Reviewed by Aura Mliss LABOR, RN (Registered Nurse) on 09/07/23 at 1403  Med List Status: <None>   Medication Order Taking? Sig Documenting Provider Last Dose Status Informant  albuterol  (VENTOLIN  HFA) 108 (90 Base) MCG/ACT inhaler [id number]  Inhale 2 puffs into the lungs every 6 (six) hours as needed for shortness of breath or wheezing.  Patient not taking: Reported on 09/04/2023   [provider]  Active Child  atorvastatin  (LIPITOR) 40 MG tablet [id number]  Take 1 tablet (40 mg total) by mouth in the morning. [name], MD  Active Child  benzonatate  (TESSALON  PERLES) 100 MG capsule [id number]  Take 1 capsule (100 mg total) by mouth 3 (three) times daily as needed. [name], MD  Active   colchicine  0.6 MG tablet [id number]  Take 1 tablet (0.6 mg total) by mouth 2 (two) times daily. Jadine Toribio SQUIBB, MD  Active   copper  tablet [id number]  Take 2 mg by  mouth in the morning and at bedtime. [provider]  Active Child  cyanocobalamin  (VITAMIN B12) 1000 MCG tablet [id number]  Take 2,000 mcg by mouth every Monday, Wednesday, and Friday. [provider]  Active  Child  dicyclomine  (BENTYL ) 10 MG capsule [id number]  Take 1 capsule (10 mg total) by mouth 3 (three) times daily as needed for spasms (abdominal cramping). [name], MD  Active Child  EPINEPHrine  0.3 mg/0.3 mL IJ SOAJ injection [id number]  Inject 0.3 mg into the muscle as needed for anaphylaxis.  Patient not taking: Reported on 09/04/2023   [name], MD  Active Child  famotidine  (PEPCID ) 20 MG tablet [id number]  Take 20 mg by mouth daily as needed for heartburn or indigestion. [provider]  Active Child  folic acid  (FOLVITE ) 1 MG tablet [id number]  Take 1 tablet (1 mg total) by mouth daily. Rizwan, [name], MD  Active Child  furosemide  (LASIX ) 40 MG tablet [id number]  Take 1 tablet (40 mg total) by mouth 2 (two) times daily. [name], MD  Active Child  HYDROcodone -acetaminophen  [facility]) 10-325 MG tablet [id number]  Take 1 tablet by mouth every 8 (eight) hours as needed for moderate pain (pain score 4-6). [provider]  Active Child  IRON -VITAMIN C  PO [id number]  Take 1 tablet by mouth daily with breakfast. [provider]  Active Child  lactulose  (CHRONULAC ) 10 GM/15ML solution [id number]  Take 45 mLs (30 g total) by mouth 3 (three) times daily. Take to ensure that there is 2 loose bowel movement EVERY DAY Cheryle Page, MD  Active Child  levothyroxine  (SYNTHROID ) 175 MCG tablet [id number]  Take 1 tablet (175 mcg total) by mouth daily at 6 (six) AM. Recheck TSH with PCP in 4 weeks [name], MD  Active Child  lidocaine  (LIDODERM ) 5 % [id number]  Place 1 patch onto the skin daily. Remove & Discard patch within 12 hours or as directed by MD  Patient not taking: Reported on 09/04/2023   Tobie Yetta HERO, MD  Active Child           Med Note [name]   Sat Dec 10, 2022 11:45 AM)    magnesium  gluconate (MAGONATE) 500 (27 Mg) MG TABS tablet [id number]  Take 1 tablet (500 mg total) by mouth in the morning and at bedtime. [name], MD  Active  Child  meclizine  (ANTIVERT ) 25 MG tablet [id number]  Take 1 tablet (25 mg total) by mouth 3 (three) times daily as needed for dizziness. Cheryle Page, MD  Active Child  megestrol  (MEGACE ) 40 MG tablet [id number]  Take 1 tablet (40 mg total) by mouth 2 (two) times daily. Ajewole, [name], MD  Active Child  methocarbamol  (ROBAXIN ) 500 MG tablet [id number]  Take 500 mg by mouth in the morning. [provider]  Active Child  midodrine  (PROAMATINE ) 10 MG tablet [id number]  TAKE 1 TABLET(10 MG) BY MOUTH THREE TIMES DAILY WITH MEALS [name], MD  Active Child  Multiple Vitamins-Minerals (CENTRUM SILVER ULTRA WOMENS PO) [id number]  Take 1 tablet by mouth in the morning. [provider]  Active Child  naloxone [facility]) nasal spray 4 mg/0.1 mL [id number]  Place 1 spray into the nose as needed (accidental overdose). [provider]  Active Child           Med Note ROLENE EVERN PULLING A   Wed Mar 08, 2023  2:39 PM)    nystatin  cream (MYCOSTATIN ) [id number]  Apply 1 Application topically 2 (two) times daily as  needed for dry skin. Cheryle Page, MD  Active Child  pantoprazole  (PROTONIX ) 40 MG tablet [id number]  Take 1 tablet (40 mg total) by mouth 2 (two) times daily before a meal. [name], MD  Active Child  potassium chloride  (KLOR-CON ) 10 MEQ tablet [id number]  Take 2 tablets (20 mEq total) by mouth 2 (two) times daily. [name], MD  Active Child  rifaximin  (XIFAXAN ) 550 MG TABS tablet [id number]  Take 550 mg by mouth 2 (two) times daily. [provider]  Active Child  rOPINIRole  (REQUIP ) 0.25 MG tablet [id number]  TAKE 1 TABLET(0.25 MG) BY MOUTH AT BEDTIME [name], MD  Active Child  senna-docusate (SENOKOT-S) 8.6-50 MG tablet [id number]  TAKE 1 TABLET BY MOUTH TWICE DAILY [name], MD  Active Child  spironolactone  (ALDACTONE ) 50 MG tablet [id number]  TAKE 1 TABLET(50 MG) BY MOUTH DAILY [name], MD  Active Child  triamcinolone  cream  (KENALOG ) 0.1 % [id number]  Apply 1 Application topically 2 (two) times daily as needed (for itching- affected areas). [provider]  Active Child  venlafaxine  XR (EFFEXOR -XR) 75 MG 24 hr capsule [id number]  Take 1 capsule (75 mg total) by mouth daily with breakfast. [name], MD  Active Child  Vitamin D , Ergocalciferol , (DRISDOL) 50000 UNITS CAPS [id number]  Take 50,000 Units by mouth every Wednesday. [provider]  Active Child  Med List Note Rolene Evern Devere DELENA Bishop 03/08/23 1450): Pt's son Tamirra Sienkiewicz helps with medications            Recommendation:   PCP Follow-up  Follow Up Plan:   Telephone follow-up 09/14/23 @ 115 pm  Mliss Creed [facility], BSN RN Care Manager/ Transition of Care [place]/ [facility] Population [facility] [phone]

## 2023-09-08 ENCOUNTER — Other Ambulatory Visit: Payer: Medicare (Managed Care) | Admitting: Licensed Clinical Social Worker

## 2023-09-08 DIAGNOSIS — M170 Bilateral primary osteoarthritis of knee: Secondary | ICD-10-CM | POA: Diagnosis not present

## 2023-09-08 DIAGNOSIS — M1712 Unilateral primary osteoarthritis, left knee: Secondary | ICD-10-CM | POA: Diagnosis not present

## 2023-09-08 DIAGNOSIS — M25562 Pain in left knee: Secondary | ICD-10-CM | POA: Diagnosis not present

## 2023-09-08 DIAGNOSIS — M25561 Pain in right knee: Secondary | ICD-10-CM | POA: Diagnosis not present

## 2023-09-08 DIAGNOSIS — M1711 Unilateral primary osteoarthritis, right knee: Secondary | ICD-10-CM | POA: Diagnosis not present

## 2023-09-08 NOTE — Patient Instructions (Signed)
 Visit Information  Thank you for taking time to visit with me today. Please don't hesitate to contact me if I can be of assistance to you before our next scheduled appointment.  Your next care management appointment is by telephone on 09/25/2023 at 2pm   Please call the care guide team at [phone] if you need to cancel, schedule, or reschedule an appointment.   Please call the Suicide and Crisis Lifeline: 988 go to [facility] [address], [place] [phone]) call 911 if you are experiencing a Mental Health or Behavioral Health Crisis or need someone to talk to.  Tobias [name], PhD [facility], [facility] Social Worker Direct Dial: [phone]  Fax: [phone]

## 2023-09-08 NOTE — Patient Outreach (Signed)
 Complex Care Management   Visit Note  09/08/2023  Name:  [name] MRN: [id number] DOB: 1955-09-13  Situation: Referral received for Complex Care Management related to Medicaid  I obtained verbal consent from Caregiver Patient.  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    SDOH Interventions    Flowsheet Row Telephone from 08/30/2023 in [place] POPULATION HEALTH DEPARTMENT Telephone from 08/17/2023 in [place] POPULATION HEALTH DEPARTMENT Patient Outreach from 06/09/2023 in [place] POPULATION HEALTH DEPARTMENT Telephone from 04/17/2023 in [place] POPULATION HEALTH DEPARTMENT ED to Hosp-Admission (Discharged) from 12/10/2022 in [place] [facility] 4TH FLOOR PROGRESSIVE CARE AND UROLOGY  SDOH Interventions       Food Insecurity Interventions Intervention Not Indicated Intervention Not Indicated Intervention Not Indicated Intervention Not Indicated Intervention Not Indicated, Inpatient TOC  Housing Interventions Intervention Not Indicated  Intervention Not Indicated Intervention Not Indicated Intervention Not Indicated Intervention Not Indicated, Inpatient TOC  Transportation Interventions Intervention Not Indicated Intervention Not Indicated Intervention Not Indicated Intervention Not Indicated, Patient Resources (Friends/Family) Intervention Not Indicated, Inpatient TOC  Utilities Interventions Intervention Not Indicated Intervention Not Indicated Intervention Not Indicated Intervention Not Indicated Intervention Not Indicated, Inpatient TOC  Financial Strain Interventions -- -- Intervention Not Indicated -- --      Recommendation:   none  Follow Up Plan:   Telephone follow up appointment date/time:  09/25/2023 at 2:00 pm  Tobias [name], PhD [facility], [facility] Social Worker Direct Dial: [phone]  Fax: [phone]

## 2023-09-11 ENCOUNTER — Other Ambulatory Visit: Payer: Self-pay | Admitting: Family Medicine

## 2023-09-11 ENCOUNTER — Encounter: Payer: Self-pay | Admitting: Family Medicine

## 2023-09-12 ENCOUNTER — Other Ambulatory Visit: Payer: Self-pay

## 2023-09-12 MED ORDER — SENNOSIDES-DOCUSATE SODIUM 8.6-50 MG PO TABS
1.0000 | ORAL_TABLET | Freq: Two times a day (BID) | ORAL | 0 refills | Status: DC
Start: 2023-09-12 — End: 2023-10-23

## 2023-09-12 MED ORDER — MIDODRINE HCL 10 MG PO TABS: 10.0000 mg | ORAL_TABLET | Freq: Three times a day (TID) | ORAL | 3 refills | Status: DC

## 2023-09-12 MED ORDER — SPIRONOLACTONE 50 MG PO TABS: 50.0000 mg | ORAL_TABLET | Freq: Every day | ORAL | 0 refills | Status: DC

## 2023-09-12 NOTE — Telephone Encounter (Signed)
 Duplicate message

## 2023-09-14 ENCOUNTER — Other Ambulatory Visit: Payer: Medicare (Managed Care) | Admitting: *Deleted

## 2023-09-14 NOTE — Patient Outreach (Signed)
 Transition of Care week 3  Visit Note  09/14/2023  Name: [name] MRN: [id number]          DOB: 01/06/1956  Situation: Patient enrolled in [facility] 30-day program. Visit completed with patient by telephone.   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   Hyperammonemia (HCC) 09/30/2022   Hypertension    Kidney stone    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   Obesity    Pulmonary emboli (HCC) 01/10/2009   Pulmonary embolism (HCC)    Short gut syndrome    Thyroid  disease     Assessment: Patient Reported Symptoms: Cognitive Cognitive Status: No symptoms reported, Alert and oriented to person, place, and time, Able to follow simple commands, Normal speech and language skills      Neurological Neurological Review of Symptoms: No symptoms reported    HEENT HEENT Symptoms Reported: No symptoms reported      Cardiovascular Cardiovascular Symptoms Reported: No symptoms reported Does patient have uncontrolled Hypertension?: No    Respiratory Respiratory Symptoms Reported: No symptoms reported    Endocrine Endocrine Symptoms Reported: No symptoms reported Is patient diabetic?: No    Gastrointestinal Gastrointestinal Symptoms Reported: No symptoms reported Additional Gastrointestinal Details: pt continues having at least 5 bowel movements per day, taking lactulose  as prescribed,  cirrhosis Gastrointestinal Management Strategies: Adequate rest,  Medication therapy Gastrointestinal Self-Management Outcome: 3 (uncertain) Gastrointestinal Comment: reviewed importance of taking lactulose  as prescribed to keep ammonia levels in check    Genitourinary Genitourinary Symptoms Reported: No symptoms reported    Integumentary Integumentary Symptoms Reported: No symptoms reported    Musculoskeletal Musculoskelatal Symptoms Reviewed: Limited mobility Additional Musculoskeletal Details: walker, lift chair, WC ramp Musculoskeletal Management Strategies: Medical device Musculoskeletal Self-Management Outcome: 4 (good) Musculoskeletal Comment: reviewed safety precautions      Psychosocial Psychosocial Symptoms Reported: No symptoms reported         There were no vitals filed for this visit.  Medications Reviewed Today     Reviewed by Aura Mliss LABOR, RN (Registered Nurse) on 09/14/23 at 1133  Med List Status: <None>   Medication Order Taking? Sig Documenting Provider Last Dose Status Informant  albuterol  (VENTOLIN  HFA) 108 (90 Base) MCG/ACT inhaler [id number]  Inhale 2 puffs into the lungs every 6 (six) hours as needed for shortness of breath or wheezing.  Patient not taking: Reported on 09/04/2023   [provider]  Active Child  atorvastatin  (LIPITOR) 40 MG tablet [id number]  Take 1 tablet (40 mg total) by mouth in the morning. [name], MD  Active Child  benzonatate  (TESSALON  PERLES) 100 MG capsule [id number]  Take 1 capsule (100 mg total) by mouth 3 (three) times daily as needed. [name], MD  Active   colchicine  0.6 MG tablet [id number]  Take 1 tablet (0.6 mg total) by mouth 2 (two) times daily. Jadine Toribio SQUIBB, MD  Active   copper  tablet [id number]  Take 2 mg by mouth in  the morning and at bedtime. [provider]  Active Child  cyanocobalamin  (VITAMIN B12) 1000 MCG tablet [id number]  Take 2,000 mcg by mouth every Monday, Wednesday, and Friday. [provider]  Active Child  dicyclomine  (BENTYL )  10 MG capsule [id number]  Take 1 capsule (10 mg total) by mouth 3 (three) times daily as needed for spasms (abdominal cramping). [name], MD  Active Child  EPINEPHrine  0.3 mg/0.3 mL IJ SOAJ injection [id number]  Inject 0.3 mg into the muscle as needed for anaphylaxis.  Patient not taking: Reported on 09/04/2023   [name], MD  Active Child  famotidine  (PEPCID ) 20 MG tablet [id number]  Take 20 mg by mouth daily as needed for heartburn or indigestion. [provider]  Active Child  folic acid  (FOLVITE ) 1 MG tablet [id number]  Take 1 tablet (1 mg total) by mouth daily. Rizwan, [name], MD  Active Child  furosemide  (LASIX ) 40 MG tablet [id number]  Take 1 tablet (40 mg total) by mouth 2 (two) times daily. [name], MD  Active Child  HYDROcodone -acetaminophen  [facility]) 10-325 MG tablet [id number]  Take 1 tablet by mouth every 8 (eight) hours as needed for moderate pain (pain score 4-6). [provider]  Active Child  IRON -VITAMIN C  PO [id number]  Take 1 tablet by mouth daily with breakfast. [provider]  Active Child  lactulose  (CHRONULAC ) 10 GM/15ML solution [id number]  Take 45 mLs (30 g total) by mouth 3 (three) times daily. Take to ensure that there is 2 loose bowel movement EVERY DAY Cheryle Page, MD  Active Child  levothyroxine  (SYNTHROID ) 175 MCG tablet [id number]  Take 1 tablet (175 mcg total) by mouth daily at 6 (six) AM. Recheck TSH with PCP in 4 weeks [name], MD  Active Child  lidocaine  (LIDODERM ) 5 % [id number]  Place 1 patch onto the skin daily. Remove & Discard patch within 12 hours or as directed by MD  Patient not taking: Reported on 09/04/2023   Tobie Yetta HERO, MD  Active Child           Med Note [name]   Sat Dec 10, 2022 11:45 AM)    magnesium  gluconate (MAGONATE) 500 (27 Mg) MG TABS tablet [id number]  Take 1 tablet (500 mg total) by mouth in the morning and at bedtime. [name], MD  Active Child  meclizine  (ANTIVERT ) 25  MG tablet [id number]  Take 1 tablet (25 mg total) by mouth 3 (three) times daily as needed for dizziness. Cheryle Page, MD  Active Child  megestrol  (MEGACE ) 40 MG tablet [id number]  Take 1 tablet (40 mg total) by mouth 2 (two) times daily. Ajewole, [name], MD  Active Child  methocarbamol  (ROBAXIN ) 500 MG tablet [id number]  Take 500 mg by mouth in the morning. [provider]  Active Child  midodrine  (PROAMATINE ) 10 MG tablet [id number]  Take 1 tablet (10 mg total) by mouth 3 (three) times daily. [name], MD  Active   Multiple Vitamins-Minerals (CENTRUM SILVER ULTRA WOMENS PO) [id number]  Take 1 tablet by mouth in the morning. [provider]  Active Child  naloxone [facility]) nasal spray 4 mg/0.1 mL [id number]  Place 1 spray into the nose as needed (accidental overdose). [provider]  Active Child           Med Note ROLENE STALLION, [place] A   Wed Mar 08, 2023  2:39 PM)    nystatin  cream (MYCOSTATIN ) [id number]  Apply 1 Application topically 2 (two) times daily as needed  for dry skin. Cheryle Page, MD  Active Child  pantoprazole  (PROTONIX ) 40 MG tablet [id number]  Take 1 tablet (40 mg total) by mouth 2 (two) times daily before a meal. [name], MD  Active Child  potassium chloride  (KLOR-CON ) 10 MEQ tablet [id number]  Take 2 tablets (20 mEq total) by mouth 2 (two) times daily. [name], MD  Active Child  rifaximin  (XIFAXAN ) 550 MG TABS tablet [id number]  Take 550 mg by mouth 2 (two) times daily. [provider]  Active Child  rOPINIRole  (REQUIP ) 0.25 MG tablet [id number]  TAKE 1 TABLET(0.25 MG) BY MOUTH AT BEDTIME [name], MD  Active Child  senna-docusate (SENOKOT-S) 8.6-50 MG tablet [id number]  Take 1 tablet by mouth 2 (two) times daily. [name], MD  Active   spironolactone  (ALDACTONE ) 50 MG tablet [id number]  Take 1 tablet (50 mg total) by mouth daily. [name], MD  Active   triamcinolone  cream (KENALOG ) 0.1 % [id number]   Apply 1 Application topically 2 (two) times daily as needed (for itching- affected areas). [provider]  Active Child  venlafaxine  XR (EFFEXOR -XR) 75 MG 24 hr capsule [id number]  Take 1 capsule (75 mg total) by mouth daily with breakfast. [name], MD  Active Child  Vitamin D , Ergocalciferol , (DRISDOL) 50000 UNITS CAPS [id number]  Take 50,000 Units by mouth every Wednesday. [provider]  Active Child  Med List Note Rolene Evern Devere DELENA Bishop 03/08/23 1450): Pt's son Emonnie Cannady helps with medications            Goals Addressed             This Visit's Progress    VBCI Transitions of Care (TOC) Care Plan       Problems:  Recent Hospitalization for treatment of hepatic encephalopathy/ cirrhosis Spoke with DPR, patient's son [name] who reports patient  is feeling okay now since ammonia levels came down, reports compliance with lactulose  and pt having at least 6 bowel movements per day 09/14/23- no new concerns reported today, continues using lactulose  and having at least 5 bowel movements per day  Goal:  Over the next 30 days, the patient will not experience hospital readmission  Interventions:  Transitions of Care: Doctor Visits  - discussed the importance of doctor visits Reviewed Signs and symptoms of infection Evaluation of current treatment plan related to acute hepatic encephalopathy, cirrhosis, self-management and patient's adherence to plan as established by provider. Discussed plans with patient for ongoing care management follow up and provided patient with direct contact information for care management team Evaluation of current treatment plan related to cirrhosis  and patient's adherence to plan as established by provider Reviewed medications with patient and discussed importance of taking all medications as prescribed.  Reinforced compliance with lactulose  and importance of regular bowel movements to keep ammonia levels in check. Reviewed  scheduled/upcoming provider appointments including primary care provider 11/08/23 Advised patient/ son to discuss frequent readmission with provider Encouraged son to assist to follow low salt diet since he provides meals Reviewed importance of daily weight and keeping a log Reviewed importance of adequate protein in diet  Reviewed bleeding risk due to cirrhosis Encouraged son to observe for any signs of infection, confusion and get medical treatment for infections asap  Patient Self Care Activities:  Attend all scheduled provider appointments Attend church or other social activities Call pharmacy for medication refills 3-7 days in advance of running out of medications Call provider office for new concerns or questions  Notify  RN Care Manager of TOC call rescheduling needs Participate in Transition of Care Program/Attend TOC scheduled calls Perform all self care activities independently  Take medications as prescribed   Work with the social worker to address care coordination needs and will continue to work with the clinical team to address health care and disease management related needs Do not skip lactulose  Provide adequate protein in diet to help with cirrhosis Get emergency assistance for bleeding- rectal blood, dark black stools or vomiting of blood  Plan:  The patient has been provided with contact information for the care management team and has been advised to call with any health related questions or concerns.  Telephone outreach - 09/21/23 @ 115 pm        Recommendation:   PCP Follow-up  Follow Up Plan:   Telephone follow-up 09/21/23 @ 115 pm  Mliss Creed [facility], BSN RN Care Manager/ Transition of Care [place]/ [facility] [phone]

## 2023-09-14 NOTE — Patient Instructions (Signed)
 Visit Information  Thank you for taking time to visit with me today. Please don't hesitate to contact me if I can be of assistance to you before our next scheduled telephone appointment.  Our next appointment is by telephone on 09/21/23 @ 115 pm  Following is a copy of your care plan:   Goals Addressed             This Visit's Progress    VBCI Transitions of Care (TOC) Care Plan       Problems:  Recent Hospitalization for treatment of hepatic encephalopathy/ cirrhosis Spoke with DPR, patient's son [name] who reports patient  is feeling okay now since ammonia levels came down, reports compliance with lactulose  and pt having at least 6 bowel movements per day 09/14/23- no new concerns reported today, continues using lactulose  and having at least 5 bowel movements per day  Goal:  Over the next 30 days, the patient will not experience hospital readmission  Interventions:  Transitions of Care: Doctor Visits  - discussed the importance of doctor visits Reviewed Signs and symptoms of infection Evaluation of current treatment plan related to acute hepatic encephalopathy, cirrhosis, self-management and patient's adherence to plan as established by provider. Discussed plans with patient for ongoing care management follow up and provided patient with direct contact information for care management team Evaluation of current treatment plan related to cirrhosis  and patient's adherence to plan as established by provider Reviewed medications with patient and discussed importance of taking all medications as prescribed.  Reinforced compliance with lactulose  and importance of regular bowel movements to keep ammonia levels in check. Reviewed scheduled/upcoming provider appointments including primary care provider 11/08/23 Advised patient/ son to discuss frequent readmission with provider Encouraged son to assist to follow low salt diet since he provides meals Reviewed importance of daily weight and  keeping a log Reviewed importance of adequate protein in diet  Reviewed bleeding risk due to cirrhosis Encouraged son to observe for any signs of infection, confusion and get medical treatment for infections asap  Patient Self Care Activities:  Attend all scheduled provider appointments Attend church or other social activities Call pharmacy for medication refills 3-7 days in advance of running out of medications Call provider office for new concerns or questions  Notify RN Care Manager of TOC call rescheduling needs Participate in Transition of Care Program/Attend TOC scheduled calls Perform all self care activities independently  Take medications as prescribed   Work with the social worker to address care coordination needs and will continue to work with the clinical team to address health care and disease management related needs Do not skip lactulose  Provide adequate protein in diet to help with cirrhosis Get emergency assistance for bleeding- rectal blood, dark black stools or vomiting of blood  Plan:  The patient has been provided with contact information for the care management team and has been advised to call with any health related questions or concerns.  Telephone outreach - 09/21/23 @ 115 pm        Patient verbalizes understanding of instructions and care plan provided today and agrees to view in MyChart. Active MyChart status and patient understanding of how to access instructions and care plan via MyChart confirmed with patient.     Telephone follow up appointment with care management team member scheduled for:  09/21/23 @ 115 pm  Please call the care guide team at [phone] if you need to cancel or reschedule your appointment.   Please call the Suicide and Crisis  Lifeline: 988 call the [place]  National Suicide Prevention Lifeline: 7263863753 or TTY: (401)323-7511 TTY 561-300-3706) to talk to a trained counselor call 1-800-273-TALK (toll free, 24 hour hotline) go to  [facility] [address] [place] [phone]) call the [facility] Crisis Line: [phone] call 911 if you are experiencing a Mental Health or Behavioral Health Crisis or need someone to talk to.  Mliss Creed [facility], BSN RN Care Manager/ Transition of Care [place]/ [facility] [phone]

## 2023-09-16 ENCOUNTER — Other Ambulatory Visit: Payer: Self-pay | Admitting: Family Medicine

## 2023-09-16 DIAGNOSIS — R600 Localized edema: Secondary | ICD-10-CM

## 2023-09-18 NOTE — Telephone Encounter (Signed)
 Requested Prescriptions  Pending Prescriptions Disp Refills   atorvastatin  (LIPITOR) 40 MG tablet [Pharmacy Med Name: ATORVASTATIN  40MG  TABLETS] 90 tablet 2    Sig: TAKE 1 TABLET(40 MG) BY MOUTH IN THE MORNING     Cardiovascular:  Antilipid - Statins Failed - 09/18/2023 11:30 AM      Failed - Lipid Panel in normal range within the last 12 months    Cholesterol  Date Value Ref Range Status  07/24/2023 95 <200 mg/dL Final   LDL Cholesterol (Calc)  Date Value Ref Range Status  07/24/2023 29 mg/dL (calc) Final    Comment:    Reference range: <100 . Desirable range <100 mg/dL for primary prevention;   <70 mg/dL for patients with CHD or diabetic patients  with > or = 2 CHD risk factors. SABRA LDL-C is now calculated using the Martin-Hopkins  calculation, which is a validated novel method providing  better accuracy than the Friedewald equation in the  estimation of LDL-C.  Gladis APPLETHWAITE et al. SANDREA. 7986;689(80): 2061-2068  (http://education.Quest[employer].com/faq/FAQ164)    HDL  Date Value Ref Range Status  07/24/2023 53 > OR = 50 mg/dL Final   Triglycerides  Date Value Ref Range Status  07/24/2023 44 <150 mg/dL Final         Passed - Patient is not pregnant      Passed - Valid encounter within last 12 months    Recent Outpatient Visits           2 weeks ago Acute hepatic encephalopathy (HCC)   [place] [facility] [name], MD   1 month ago Acute hepatic encephalopathy [facility])   [place] [facility] [name], MD   4 months ago Edema of both lower legs   [place] [facility] [name], MD   6 months ago Pruritus   [place] [facility] [name], MD              Refused Prescriptions Disp Refills   rOPINIRole  (REQUIP ) 0.25 MG tablet [Pharmacy Med Name: ROPINIROLE  0.25MG  TABLETS] 90 tablet 1    Sig: TAKE 1 TABLET(0.25 MG) BY MOUTH AT BEDTIME     Neurology:   Parkinsonian Agents Passed - 09/18/2023 11:30 AM      Passed - Last BP in normal range    BP Readings from Last 1 Encounters:  09/04/23 118/74         Passed - Last Heart Rate in normal range    Pulse Readings from Last 1 Encounters:  09/04/23 79         Passed - Valid encounter within last 12 months    Recent Outpatient Visits           2 weeks ago Acute hepatic encephalopathy (HCC)   [place] [facility] [name], MD   1 month ago Acute hepatic encephalopathy [facility])   [place] [facility] [name], MD   4 months ago Edema of both lower legs   [place] [facility] [name], MD   6 months ago Pruritus    [place] [facility] [name], MD               furosemide  (LASIX ) 40 MG tablet [Pharmacy Med Name: FUROSEMIDE  40MG  TABLETS] 90 tablet 0    Sig: TAKE 1 TABLET(40 MG) BY MOUTH TWICE DAILY     Cardiovascular:  Diuretics - Loop Failed -  09/18/2023 11:30 AM      Failed - Ca in normal range and within 180 days    Calcium   Date Value Ref Range Status  09/04/2023 8.1 (L) 8.6 - 10.4 mg/dL Final   Calcium , Ion  Date Value Ref Range Status  09/30/2022 1.09 (L) 1.15 - 1.40 mmol/L Final         Failed - Cl in normal range and within 180 days    Chloride  Date Value Ref Range Status  09/04/2023 111 (H) 98 - 110 mmol/L Final    Comment:    Verified by repeat analysis. .          Passed - K in normal range and within 180 days    Potassium  Date Value Ref Range Status  09/04/2023 3.5 3.5 - 5.3 mmol/L Final         Passed - Na in normal range and within 180 days    Sodium  Date Value Ref Range Status  09/04/2023 137 135 - 146 mmol/L Final         Passed - Cr in normal range and within 180 days    Creat  Date Value Ref Range Status  09/04/2023 0.97 0.50 - 1.05 mg/dL Final   Creatinine, Urine  Date Value Ref Range Status  05/23/2023 39 mg/dL Final    Comment:     Performed at [facility], [address] [place], [place] [address]         Passed - Mg Level in normal range and within 180 days    Magnesium   Date Value Ref Range Status  08/10/2023 2.0 1.7 - 2.4 mg/dL Final    Comment:    Performed at [facility], [address], [place], [place] [address]         Passed - Last BP in normal range    BP Readings from Last 1 Encounters:  09/04/23 118/74         Passed - Valid encounter within last 6 months    Recent Outpatient Visits           2 weeks ago Acute hepatic encephalopathy [facility])   [place] [facility] [name], MD   1 month ago Acute hepatic encephalopathy [facility])   [place] [facility] Medicine [name], MD   4 months ago Edema of both lower legs   [place] [facility] [name], MD   6 months ago Pruritus   [place] [facility] [name], MD

## 2023-09-20 ENCOUNTER — Other Ambulatory Visit: Payer: Self-pay | Admitting: Family Medicine

## 2023-09-20 ENCOUNTER — Ambulatory Visit: Payer: Medicare (Managed Care)

## 2023-09-20 VITALS — Ht 62.0 in | Wt 203.0 lb

## 2023-09-20 DIAGNOSIS — Z0000 Encounter for general adult medical examination without abnormal findings: Secondary | ICD-10-CM

## 2023-09-20 NOTE — Patient Instructions (Addendum)
 Ms. Gebel,  Thank you for taking the time for your Medicare Wellness Visit. I appreciate your continued commitment to your health goals. Please review the care plan we discussed, and feel free to reach out if I can assist you further.  Medicare recommends these wellness visits once per year to help you and your care team stay ahead of potential health issues. These visits are designed to focus on prevention, allowing your provider to concentrate on managing your acute and chronic conditions during your regular appointments.  Please note that Annual Wellness Visits do not include a physical exam. Some assessments may be limited, especially if the visit was conducted virtually. If needed, we may recommend a separate in-person follow-up with your provider.  Ongoing Care Seeing your primary care provider every 3 to 6 months helps us  monitor your health and provide consistent, personalized care.   Referrals If a referral was made during today's visit and you haven't received any updates within two weeks, please contact the referred provider directly to check on the status.  [facility]: [address] [place], [place] [address]   [phone]  Recommended Screenings:  Health Maintenance  Topic Date Due   Hepatitis C Screening  Never done   DTaP/Tdap/Td vaccine (1 - Tdap) Never done   Zoster (Shingles) Vaccine (1 of 2) Never done   DEXA scan (bone density measurement)  Never done   Flu Shot  08/11/2023   COVID-19 Vaccine (4 - 2025-26 season) 09/11/2023   Medicare Annual Wellness Visit  09/19/2024   Mammogram  11/13/2024   Colon Cancer Screening  04/01/2029   Pneumococcal Vaccine for age over 64  Completed   HPV Vaccine  Aged Out   Meningitis B Vaccine  Aged Out   Screening for Lung Cancer  Discontinued   Hepatitis B Vaccine  Discontinued       09/20/2023   12:51 PM  Advanced Directives  Does Patient Have a Medical Advance Directive? No  Would patient like information on creating a  medical advance directive? Yes (MAU/Ambulatory/Procedural Areas - Information given)   Advance Care Planning is important because it: Ensures you receive medical care that aligns with your values, goals, and preferences. Provides guidance to your family and loved ones, reducing the emotional burden of decision-making during critical moments.  Information on Advanced Care Planning can be found at [place]  Secretary of [facility] Advance Health Care Directives Advance Health Care Directives ([email or web address]   Vision: Annual vision screenings are recommended for early detection of glaucoma, cataracts, and diabetic retinopathy. These exams can also reveal signs of chronic conditions such as diabetes and high blood pressure.  Dental: Annual dental screenings help detect early signs of oral cancer, gum disease, and other conditions linked to overall health, including heart disease and diabetes.  Please see the attached documents for additional preventive care recommendations.

## 2023-09-20 NOTE — Progress Notes (Signed)
 Subjective:   [name] is a 68 y.o. who presents for a Medicare Wellness preventive visit.  As a reminder, Annual Wellness Visits don't include a physical exam, and some assessments may be limited, especially if this visit is performed virtually. We may recommend an in-person follow-up visit with your provider if needed.  Visit Complete: Virtual I connected with  [name] on 09/20/23 by a audio enabled telemedicine application and verified that I am speaking with the correct person using two identifiers.  Patient Location: Home  Provider Location: Home Office  I discussed the limitations of evaluation and management by telemedicine. The patient expressed understanding and agreed to proceed.  Vital Signs: Because this visit was a virtual/telehealth visit, some criteria may be missing or patient reported. Any vitals not documented were not able to be obtained and vitals that have been documented are patient reported.  VideoDeclined- This patient declined Librarian, academic. Therefore the visit was completed with audio only.  Persons Participating in Visit: Patient assisted by son [name] .  AWV Questionnaire: No: Patient Medicare AWV questionnaire was not completed prior to this visit.  Cardiac Risk Factors include: advanced age (>60men, >88 women);hypertension;dyslipidemia     Objective:    Today's Vitals   09/20/23 1103  Weight: 203 lb (92.1 kg)  Height: 5' 2 (1.575 m)   Body mass index is 37.13 kg/m.     09/20/2023   12:51 PM 08/25/2023   12:30 AM 08/08/2023    5:28 PM 06/28/2023   10:40 PM 06/28/2023    7:39 PM 06/26/2023    2:12 PM 05/23/2023    2:28 AM  Advanced Directives  Does Patient Have a Medical Advance Directive? No No No  Yes Yes No  Type of Aeronautical engineer of [place];Living will Healthcare Power of [place];Living will Healthcare Power of [place];Living will   Does patient want to make changes to  medical advance directive?    No - Guardian declined     Would patient like information on creating a medical advance directive? Yes (MAU/Ambulatory/Procedural Areas - Information given) No - Patient declined No - Patient declined    No - Patient declined    Current Medications (verified) Outpatient Encounter Medications as of 09/20/2023  Medication Sig   albuterol  (VENTOLIN  HFA) 108 (90 Base) MCG/ACT inhaler Inhale 2 puffs into the lungs every 6 (six) hours as needed for shortness of breath or wheezing.   atorvastatin  (LIPITOR) 40 MG tablet TAKE 1 TABLET(40 MG) BY MOUTH IN THE MORNING   benzonatate  (TESSALON  PERLES) 100 MG capsule Take 1 capsule (100 mg total) by mouth 3 (three) times daily as needed.   colchicine  0.6 MG tablet Take 1 tablet (0.6 mg total) by mouth 2 (two) times daily.   copper  tablet Take 2 mg by mouth in the morning and at bedtime.   cyanocobalamin  (VITAMIN B12) 1000 MCG tablet Take 2,000 mcg by mouth every Monday, Wednesday, and Friday.   dicyclomine  (BENTYL ) 10 MG capsule Take 1 capsule (10 mg total) by mouth 3 (three) times daily as needed for spasms (abdominal cramping).   EPINEPHrine  0.3 mg/0.3 mL IJ SOAJ injection Inject 0.3 mg into the muscle as needed for anaphylaxis.   famotidine  (PEPCID ) 20 MG tablet Take 20 mg by mouth daily as needed for heartburn or indigestion.   folic acid  (FOLVITE ) 1 MG tablet Take 1 tablet (1 mg total) by mouth daily.   furosemide  (LASIX ) 40 MG tablet Take 1 tablet (  40 mg total) by mouth 2 (two) times daily.   HYDROcodone -acetaminophen  (NORCO) 10-325 MG tablet Take 1 tablet by mouth every 8 (eight) hours as needed for moderate pain (pain score 4-6).   IRON -VITAMIN C  PO Take 1 tablet by mouth daily with breakfast.   lactulose  (CHRONULAC ) 10 GM/15ML solution Take 45 mLs (30 g total) by mouth 3 (three) times daily. Take to ensure that there is 2 loose bowel movement EVERY DAY   levothyroxine  (SYNTHROID ) 175 MCG tablet Take 1 tablet (175 mcg  total) by mouth daily at 6 (six) AM. Recheck TSH with PCP in 4 weeks   magnesium  gluconate (MAGONATE) 500 (27 Mg) MG TABS tablet Take 1 tablet (500 mg total) by mouth in the morning and at bedtime.   meclizine  (ANTIVERT ) 25 MG tablet Take 1 tablet (25 mg total) by mouth 3 (three) times daily as needed for dizziness.   megestrol  (MEGACE ) 40 MG tablet Take 1 tablet (40 mg total) by mouth 2 (two) times daily.   methocarbamol  (ROBAXIN ) 500 MG tablet Take 500 mg by mouth in the morning.   midodrine  (PROAMATINE ) 10 MG tablet Take 1 tablet (10 mg total) by mouth 3 (three) times daily.   Multiple Vitamins-Minerals (CENTRUM SILVER ULTRA WOMENS PO) Take 1 tablet by mouth in the morning.   naloxone (NARCAN) nasal spray 4 mg/0.1 mL Place 1 spray into the nose as needed (accidental overdose).   nystatin  cream (MYCOSTATIN ) Apply 1 Application topically 2 (two) times daily as needed for dry skin.   pantoprazole  (PROTONIX ) 40 MG tablet Take 1 tablet (40 mg total) by mouth 2 (two) times daily before a meal.   potassium chloride  (KLOR-CON ) 10 MEQ tablet Take 2 tablets (20 mEq total) by mouth 2 (two) times daily.   rifaximin  (XIFAXAN ) 550 MG TABS tablet Take 550 mg by mouth 2 (two) times daily.   rOPINIRole  (REQUIP ) 0.25 MG tablet TAKE 1 TABLET(0.25 MG) BY MOUTH AT BEDTIME   senna-docusate (SENOKOT-S) 8.6-50 MG tablet Take 1 tablet by mouth 2 (two) times daily.   spironolactone  (ALDACTONE ) 50 MG tablet Take 1 tablet (50 mg total) by mouth daily.   triamcinolone  cream (KENALOG ) 0.1 % Apply 1 Application topically 2 (two) times daily as needed (for itching- affected areas).   venlafaxine  XR (EFFEXOR -XR) 75 MG 24 hr capsule Take 1 capsule (75 mg total) by mouth daily with breakfast.   Vitamin D , Ergocalciferol , (DRISDOL) 50000 UNITS CAPS Take 50,000 Units by mouth every Wednesday.   lidocaine  (LIDODERM ) 5 % Place 1 patch onto the skin daily. Remove & Discard patch within 12 hours or as directed by MD (Patient not  taking: Reported on 09/20/2023)   No facility-administered encounter medications on file as of 09/20/2023.    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  Hyperammonemia (HCC) 09/30/2022   Hypertension    Kidney stone    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   Obesity    Pulmonary emboli (HCC) 01/10/2009   Pulmonary embolism (HCC)    Short gut syndrome    Thyroid  disease    Past Surgical History:  Procedure Laterality Date   ABDOMINAL WALL MESH  REMOVAL     2016   CESAREAN SECTION WITH BILATERAL TUBAL LIGATION  04/30/1985   CHOLECYSTECTOMY  05/2007   Exploratory laparotomy, lysis of adhesions, takedown of ileostomy with small bowel resection, open cholecystectomy and ileocolostomy   COLON SURGERY  01/2007   exploratory laparotomy with right colecotmy and end ileostomy   COLONOSCOPY  04/02/2019   Dr Gunnar Chris Daniels, MD [facility]   ESOPHAGOGASTRODUODENOSCOPY (EGD) WITH PROPOFOL  N/A 10/19/2022   Procedure: ESOPHAGOGASTRODUODENOSCOPY (EGD) WITH PROPOFOL ;  Surgeon: Saintclair Jasper, MD;  Location: [facility];  Service: Gastroenterology;  Laterality: N/A;   ESOPHAGOSCOPY  04/02/2019   EYE SURGERY Left 10/06/2020   Cataract  extraciton w/intraocular lens implant- Dr Caresse   HERNIA REPAIR  10/2007   ILEOSTOMY  2009   states bowel perfotation wih colonsocopy   ILEOSTOMY CLOSURE  2009   INCISIONAL HERNIA REPAIR  10/11/2018   IR KYPHO THORACIC WITH BONE BIOPSY  08/31/2022   IR KYPHO THORACIC WITH BONE BIOPSY  10/27/2022   IR RADIOLOGIST EVAL & MGMT  09/27/2022   KIDNEY STONE SURGERY     LIVER RESECTION     2009   PANNICULECTOMY     repair of abdominal wall hernia   WRIST SURGERY     tendonitis   Family History  Problem Relation Age of Onset   Uterine cancer Mother    Bone cancer Father    Heart disease Father    Hyperlipidemia Father    Dementia Father    Hypertension Father    Skin cancer Sister    Hypertension Sister    Diabetes Sister    Hyperlipidemia Brother    Hypertension Brother    Heart disease Brother    Social History   Socioeconomic History   Marital status: Single    Spouse name: Not on file   Number of children: 1   Years of education: Not on file   Highest education level: Not on file  Occupational History   Not on file  Tobacco Use   Smoking status: Former    Current packs/day: 0.00    Types: Cigarettes    Quit date: 1987    Years since quitting: 38.7    Passive exposure: Past   Smokeless tobacco: Never  Vaping Use   Vaping status: Never Used  Substance and Sexual Activity   Alcohol use: No   Drug use: No   Sexual activity: Not Currently    Birth control/protection: Post-menopausal  Other Topics Concern   Not on file  Social History Narrative   Not on file   Social Drivers of Health   Financial Resource Strain: Low Risk  (09/20/2023)   Overall Financial Resource Strain (CARDIA)    Difficulty of Paying Living Expenses: Not hard at all  Food Insecurity: No Food Insecurity (09/20/2023)   Hunger Vital Sign    Worried About Running Out of Food in the Last Year: Never true    Ran Out of Food in the Last Year: Never true  Transportation Needs: No  Transportation Needs (09/20/2023)   PRAPARE - Transportation    Lack of Transportation (Medical): No    Lack  of Transportation (Non-Medical): No  Physical Activity: Inactive (09/20/2023)   Exercise Vital Sign    Days of Exercise per Week: 0 days    Minutes of Exercise per Session: 0 min  Stress: No Stress Concern Present (09/20/2023)   [employer] of Occupational Health - Occupational Stress Questionnaire    Feeling of Stress: Only a little  Social Connections: Unknown (09/20/2023)   Social Connection and Isolation Panel    Frequency of Communication with Friends and Family: More than three times a week    Frequency of Social Gatherings with Friends and Family: Three times a week    Attends Religious Services: Never    Active Member of Clubs or Organizations: No    Attends Banker Meetings: Never    Marital Status: Patient declined    Tobacco Counseling Counseling given: Not Answered    Clinical Intake:  Pre-visit preparation completed: Yes  Pain : No/denies pain  Diabetes: No  Lab Results  Component Value Date   HGBA1C 4.4 (L) 01/03/2023     How often do you need to have someone help you when you read instructions, pamphlets, or other written materials from your doctor or pharmacy?: 1 - Never  Interpreter Needed?: No  Information entered by :: Charmaine Bloodgood LPN   Activities of Daily Living     09/20/2023   12:50 PM 08/25/2023   12:30 AM  In your present state of health, do you have any difficulty performing the following activities:  Hearing? 0 0  Vision? 1 0  Difficulty concentrating or making decisions? 1 0  Walking or climbing stairs? 1   Dressing or bathing? 0   Doing errands, shopping? 1 0  Preparing Food and eating ? N   Using the Toilet? N   In the past six months, have you accidently leaked urine? N   Do you have problems with loss of bowel control? N   Managing your Medications? Y   Managing your Finances? Y   Housekeeping or  managing your Housekeeping? Y     Patient Care Team: [name], MD as PCP - General (Family Medicine) Maranda Lister D as Social Worker Aura Mliss LABOR, RN as [facility] Care Management Caresse Cough, MD as Referring Physician (Ophthalmology) Jeralyn Crutch, MD as Consulting Physician (Obstetrics and Gynecology) Ernie Cough, MD as Consulting Physician (Orthopedic Surgery) Clement Livings, [place] (Physician Assistant) Jarold Zachary DEL., MD as Referring Physician (Hematology and Oncology)  I have updated your Care Teams any recent Medical Services you may have received from other providers in the past year.     Assessment:   This is a routine wellness examination for [name].  Hearing/Vision screen Hearing Screening - Comments:: Denies hearing difficulties   Vision Screening - Comments:: up to date with routine eye exams with Dr. Caresse (vision problems)    Goals Addressed             This Visit's Progress    Remain as independent as possible   On track      Depression Screen     09/20/2023   12:48 PM 08/30/2023   11:58 AM 08/17/2023    5:11 PM 07/24/2023   10:48 AM 03/06/2023   12:09 PM 03/06/2023   12:08 PM 11/03/2022    3:49 PM  PHQ 2/9 Scores  PHQ - 2 Score 1   1 1 1 2   PHQ- 9 Score 7   7 7  7   Exception Documentation  Other- indicate reason  in comment box Other- indicate reason in comment box      Not completed  did not speak with pt,   spoke with patient's son [name] COLT         Fall Risk     09/20/2023   12:50 PM 08/30/2023   11:59 AM 08/30/2023   11:55 AM 08/17/2023    5:11 PM 03/06/2023   12:08 PM  Fall Risk   Falls in the past year? 0  0 0 0  Number falls in past yr: 0 0  0 0  Injury with Fall? 0   0 0  Risk for fall due to : Impaired vision;Impaired mobility Impaired mobility;Medication side effect;Mental status change     Follow up Falls prevention discussed;Education provided;Falls evaluation completed  Falls evaluation  completed;Falls prevention discussed      MEDICARE RISK AT HOME:  Medicare Risk at Home Any stairs in or around the home?: No If so, are there any without handrails?: No Home free of loose throw rugs in walkways, pet beds, electrical cords, etc?: Yes Adequate lighting in your home to reduce risk of falls?: Yes Life alert?: No Use of a cane, walker or w/c?: Yes Grab bars in the bathroom?: Yes Shower chair or bench in shower?: Yes Elevated toilet seat or a handicapped toilet?: Yes  TIMED UP AND GO:  Was the test performed?  No  Cognitive Function: Unable: Due to language barrier, hearing or vision limitations or other   Immunizations Immunization History  Administered Date(s) Administered   Fluad Trivalent(High Dose 65+) 10/24/2022   Fluzone Influenza virus vaccine,trivalent (IIV3), split virus 12/21/2017   Hepatitis A, Adult 04/24/2023   Hepatitis A, Ped/Adol-2 Dose 08/19/2022   Hepb-cpg 08/19/2022, 04/24/2023   Influenza,inj,Quad PF,6+ Mos 12/01/2014, 10/06/2016, 01/01/2019, 09/21/2020, 10/18/2021   Influenza-Unspecified 12/01/2014   PFIZER(Purple Top)SARS-COV-2 Vaccination 04/05/2019, 04/26/2019, 10/28/2019   PNEUMOCOCCAL CONJUGATE-20 10/24/2022    Screening Tests Health Maintenance  Topic Date Due   Hepatitis C Screening  Never done   DTaP/Tdap/Td (1 - Tdap) Never done   Zoster Vaccines- Shingrix (1 of 2) Never done   DEXA SCAN  Never done   Influenza Vaccine  08/11/2023   COVID-19 Vaccine (4 - 2025-26 season) 09/11/2023   Medicare Annual Wellness (AWV)  09/19/2024   MAMMOGRAM  11/13/2024   Colonoscopy  04/01/2029   Pneumococcal Vaccine: 50+ Years  Completed   HPV VACCINES  Aged Out   Meningococcal B Vaccine  Aged Out   Lung Cancer Screening  Discontinued   Hepatitis B Vaccines 19-59 Average Risk  Discontinued    Health Maintenance Items Addressed: Information provided on vaccine recommendations;  patient would like to discuss dexa with pcp   Additional  Screening:  Vision Screening: Recommended annual ophthalmology exams for early detection of glaucoma and other disorders of the eye. Is the patient up to date with their annual eye exam?  Yes  Who is the provider or what is the name of the office in which the patient attends annual eye exams? Dr. [place]  Dental Screening: Recommended annual dental exams for proper oral hygiene  Community Resource Referral / Chronic Care Management: CRR required this visit?  No   CCM required this visit?  No   Plan:    I have personally reviewed and noted the following in the patient's chart:   Medical and social history Use of alcohol, tobacco or illicit drugs  Current medications and supplements including opioid prescriptions. Patient is currently taking opioid prescriptions.  Information provided to patient regarding non-opioid alternatives. Patient advised to discuss non-opioid treatment plan with their provider. Functional ability and status Nutritional status Physical activity Advanced directives List of other physicians Hospitalizations, surgeries, and ER visits in previous 12 months Vitals Screenings to include cognitive, depression, and falls Referrals and appointments  In addition, I have reviewed and discussed with patient certain preventive protocols, quality metrics, and best practice recommendations. A written personalized care plan for preventive services as well as general preventive health recommendations were provided to patient.   Lavelle Pfeiffer [place], [place]   [id number]   After Visit Summary: (MyChart) Due to this being a telephonic visit, the after visit summary with patients personalized plan was offered to patient via MyChart   Notes: Nothing significant to report at this time.

## 2023-09-21 ENCOUNTER — Other Ambulatory Visit: Payer: Medicare (Managed Care) | Admitting: *Deleted

## 2023-09-21 NOTE — Patient Outreach (Signed)
 Transition of Care week 4  Visit Note  09/21/2023  Name: [name] MRN: [id number]          DOB: Sep 29, 1955  Situation: Patient enrolled in [facility] 30-day program. Visit completed with patient by telephone.   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   Hyperammonemia (HCC) 09/30/2022   Hypertension    Kidney stone    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   Obesity    Pulmonary emboli (HCC) 01/10/2009   Pulmonary embolism (HCC)    Short gut syndrome    Thyroid  disease     Assessment: Patient Reported Symptoms: Cognitive Cognitive Status: No symptoms reported, Alert and oriented to person, place, and time, Normal speech and language skills, Able to follow simple commands      Neurological Neurological Review of Symptoms: No symptoms reported    HEENT HEENT Symptoms Reported: No symptoms reported      Cardiovascular Cardiovascular Symptoms Reported: No symptoms reported Does patient have uncontrolled Hypertension?: No    Respiratory Respiratory Symptoms Reported: No symptoms reported    Endocrine Endocrine Symptoms Reported: No symptoms reported Is patient diabetic?: No    Gastrointestinal Gastrointestinal Symptoms Reported: No symptoms reported Additional Gastrointestinal Details: pt continues having 4-5 bowel movements per day, taking lactulose  as prescribed/  cirrhosis Gastrointestinal Management Strategies: Adequate rest,  Medication therapy Gastrointestinal Self-Management Outcome: 3 (uncertain) Gastrointestinal Comment: reinforced importance of taking lactulose  as prescribed to keep ammonia levels in check    Genitourinary Genitourinary Symptoms Reported: No symptoms reported    Integumentary Integumentary Symptoms Reported: No symptoms reported    Musculoskeletal Musculoskelatal Symptoms Reviewed: Limited mobility Additional Musculoskeletal Details: walker, lift chair, WC ramp Musculoskeletal Management Strategies: Medical device Musculoskeletal Self-Management Outcome: 4 (good) Musculoskeletal Comment: reinforced safety precautions      Psychosocial Psychosocial Symptoms Reported: No symptoms reported         There were no vitals filed for this visit.  Medications Reviewed Today     Reviewed by Aura Mliss LABOR, RN (Registered Nurse) on 09/21/23 at 1011  Med List Status: <None>   Medication Order Taking? Sig Documenting Provider Last Dose Status Informant  albuterol  (VENTOLIN  HFA) 108 (90 Base) MCG/ACT inhaler [id number]  Inhale 2 puffs into the lungs every 6 (six) hours as needed for shortness of breath or wheezing. [provider]  Active Child  atorvastatin  (LIPITOR) 40 MG tablet [id number]  TAKE 1 TABLET(40 MG) BY MOUTH IN THE ERMALINDA [name], MD  Active   benzonatate  (TESSALON  PERLES) 100 MG capsule [id number]  Take 1 capsule (100 mg total) by mouth 3 (three) times daily as needed. [name], MD  Active   colchicine  0.6 MG tablet [id number]  Take 1 tablet (0.6 mg total) by mouth 2 (two) times daily. Jadine Toribio SQUIBB, MD  Active   copper  tablet [id number]  Take 2 mg by mouth in the morning and at bedtime. [provider]  Active Child  cyanocobalamin  (VITAMIN B12) 1000 MCG tablet [id number]  Take 2,000 mcg by mouth every Monday, Wednesday, and Friday. [provider]  Active Child  dicyclomine  (BENTYL ) 10 MG capsule [id number]  Take 1 capsule (10 mg total) by  mouth 3 (three) times daily as needed for spasms (abdominal cramping). [name], MD  Active Child  EPINEPHrine  0.3 mg/0.3 mL IJ SOAJ injection [id number]  Inject 0.3 mg into the muscle as needed for anaphylaxis. [name], MD  Active Child  famotidine  (PEPCID ) 20 MG tablet [id number]  Take 20 mg by mouth daily as needed for heartburn or indigestion. [provider]  Active Child  folic acid  (FOLVITE ) 1 MG tablet [id number]  Take 1 tablet (1 mg total) by mouth daily. Rizwan, [name], MD  Active Child  furosemide  (LASIX ) 40 MG tablet [id number]  Take 1 tablet (40 mg total) by mouth 2 (two) times daily. [name], MD  Active Child  HYDROcodone -acetaminophen  (NORCO) 10-325 MG tablet [id number]  Take 1 tablet by mouth every 8 (eight) hours as needed for moderate pain (pain score 4-6). [provider]  Active Child  IRON -VITAMIN C  PO [id number]  Take 1 tablet by mouth daily with breakfast. [provider]  Active Child  lactulose  (CHRONULAC ) 10 GM/15ML solution [id number]  Take 45 mLs (30 g total) by mouth 3 (three) times daily. Take to ensure that there is 2 loose bowel movement EVERY DAY Cheryle Page, MD  Active Child  levothyroxine  (SYNTHROID ) 175 MCG tablet [id number]  TAKE 1 TABLET BY MOUTH DAILY A 6AM. RECHECK TSH WITH PCP IN 4 WEEKS [name], MD  Active   lidocaine  (LIDODERM ) 5 % [id number]  Place 1 patch onto the skin daily. Remove & Discard patch within 12 hours or as directed by MD  Patient not taking: Reported on 09/20/2023   Tobie Yetta HERO, MD  Active Child           Med Note [name]   Sat Dec 10, 2022 11:45 AM)    magnesium  gluconate (MAGONATE) 500 (27 Mg) MG TABS tablet [id number]  Take 1 tablet (500 mg total) by mouth in the morning and at bedtime. [name], MD  Active Child  meclizine  (ANTIVERT ) 25 MG tablet [id number]  Take 1 tablet (25 mg total) by mouth 3 (three) times daily as needed for dizziness. Cheryle Page, MD  Active  Child  megestrol  (MEGACE ) 40 MG tablet [id number]  Take 1 tablet (40 mg total) by mouth 2 (two) times daily. Ajewole, [name], MD  Active Child  methocarbamol  (ROBAXIN ) 500 MG tablet [id number]  Take 500 mg by mouth in the morning. [provider]  Active Child  midodrine  (PROAMATINE ) 10 MG tablet [id number]  Take 1 tablet (10 mg total) by mouth 3 (three) times daily. [name], MD  Active   Multiple Vitamins-Minerals (CENTRUM SILVER ULTRA WOMENS PO) [id number]  Take 1 tablet by mouth in the morning. [provider]  Active Child  naloxone [facility]) nasal spray 4 mg/0.1 mL [id number]  Place 1 spray into the nose as needed (accidental overdose). [provider]  Active Child           Med Note ROLENE STALLION, [place] A   Wed Mar 08, 2023  2:39 PM)    nystatin  cream (MYCOSTATIN ) [id number]  Apply 1 Application topically 2 (two) times daily as needed for dry skin. Cheryle Page, MD  Active Child  pantoprazole  (PROTONIX ) 40 MG tablet [id number]  Take 1 tablet (40 mg total) by mouth 2 (  two) times daily before a meal. [name], MD  Active Child  potassium chloride  (KLOR-CON ) 10 MEQ tablet [id number]  Take 2 tablets (20 mEq total) by mouth 2 (two) times daily. [name], MD  Active Child  rifaximin  (XIFAXAN ) 550 MG TABS tablet [id number]  Take 550 mg by mouth 2 (two) times daily. [provider]  Active Child  rOPINIRole  (REQUIP ) 0.25 MG tablet [id number]  TAKE 1 TABLET(0.25 MG) BY MOUTH AT BEDTIME [name], MD  Active Child  senna-docusate (SENOKOT-S) 8.6-50 MG tablet [id number]  Take 1 tablet by mouth 2 (two) times daily. [name], MD  Active   spironolactone  (ALDACTONE ) 50 MG tablet [id number]  Take 1 tablet (50 mg total) by mouth daily. [name], MD  Active   triamcinolone  cream (KENALOG ) 0.1 % [id number]  Apply 1 Application topically 2 (two) times daily as needed (for itching- affected areas). [provider]  Active Child   venlafaxine  XR (EFFEXOR -XR) 75 MG 24 hr capsule [id number]  Take 1 capsule (75 mg total) by mouth daily with breakfast. [name], MD  Active Child  Vitamin D , Ergocalciferol , (DRISDOL) 50000 UNITS CAPS [id number]  Take 50,000 Units by mouth every Wednesday. [provider]  Active Child  Med List Note Rolene Evern Devere DELENA Bishop 03/08/23 1450): Pt's son Camie Hauss helps with medications            Goals Addressed             This Visit's Progress    VBCI Transitions of Care (TOC) Care Plan       Problems:  Recent Hospitalization for treatment of hepatic encephalopathy/ cirrhosis Spoke with DPR, patient's son [name] who reports patient  is feeling okay now since ammonia levels came down, reports compliance with lactulose  and pt having at least 6 bowel movements per day 09/21/23- no new concerns reported today, continues using lactulose  and having at least 4- 5 bowel movements per day  Goal:  Over the next 30 days, the patient will not experience hospital readmission  Interventions:  Transitions of Care: Doctor Visits  - discussed the importance of doctor visits Reviewed Signs and symptoms of infection Evaluation of current treatment plan related to acute hepatic encephalopathy, cirrhosis, self-management and patient's adherence to plan as established by provider. Discussed plans with patient for ongoing care management follow up and provided patient with direct contact information for care management team Evaluation of current treatment plan related to cirrhosis  and patient's adherence to plan as established by provider Reviewed medications with patient and discussed importance of taking all medications as prescribed.  Reviewed compliance with lactulose  and importance of regular bowel movements to keep ammonia levels in check. Reviewed scheduled/upcoming provider appointments including primary care provider 11/08/23 Advised patient/ son to discuss frequent  readmission with provider Encouraged son to assist to follow low salt diet since he provides meals Reviewed importance of daily weight and keeping a log Reinforced importance of adequate protein in diet  Reinforced bleeding risk due to cirrhosis Encouraged son to observe for any signs of infection, confusion and get medical treatment for infections asap Pain assessment completed  Patient Self Care Activities:  Attend all scheduled provider appointments Attend church or other social activities Call pharmacy for medication refills 3-7 days in advance of running out of medications Call provider office for new concerns or questions  Notify RN Care Manager of TOC call rescheduling needs Participate in Transition of Care Program/Attend TOC scheduled calls Perform all self care activities  independently  Take medications as prescribed   Work with the social worker to address care coordination needs and will continue to work with the clinical team to address health care and disease management related needs Do not skip lactulose  Provide adequate protein in diet to help with cirrhosis Get emergency assistance for bleeding- rectal blood, dark black stools or vomiting of blood  Plan:  The patient has been provided with contact information for the care management team and has been advised to call with any health related questions or concerns.  Telephone outreach - 09/28/23 @ 2 pm        Recommendation:   PCP Follow-up  Follow Up Plan:   Telephone follow-up 09/28/23 @ 2 pm  Mliss Creed [facility], BSN RN Care Manager/ Transition of Care [place]/ [facility] [phone]

## 2023-09-21 NOTE — Patient Instructions (Signed)
 Visit Information  Thank you for taking time to visit with me today. Please don't hesitate to contact me if I can be of assistance to you before our next scheduled telephone appointment.  Our next appointment is by telephone on 09/28/23 @ 2 pm  Following is a copy of your care plan:   Goals Addressed             This Visit's Progress    VBCI Transitions of Care (TOC) Care Plan       Problems:  Recent Hospitalization for treatment of hepatic encephalopathy/ cirrhosis Spoke with DPR, patient's son [name] who reports patient  is feeling okay now since ammonia levels came down, reports compliance with lactulose  and pt having at least 6 bowel movements per day 09/21/23- no new concerns reported today, continues using lactulose  and having at least 4- 5 bowel movements per day  Goal:  Over the next 30 days, the patient will not experience hospital readmission  Interventions:  Transitions of Care: Doctor Visits  - discussed the importance of doctor visits Reviewed Signs and symptoms of infection Evaluation of current treatment plan related to acute hepatic encephalopathy, cirrhosis, self-management and patient's adherence to plan as established by provider. Discussed plans with patient for ongoing care management follow up and provided patient with direct contact information for care management team Evaluation of current treatment plan related to cirrhosis  and patient's adherence to plan as established by provider Reviewed medications with patient and discussed importance of taking all medications as prescribed.  Reviewed compliance with lactulose  and importance of regular bowel movements to keep ammonia levels in check. Reviewed scheduled/upcoming provider appointments including primary care provider 11/08/23 Advised patient/ son to discuss frequent readmission with provider Encouraged son to assist to follow low salt diet since he provides meals Reviewed importance of daily weight and  keeping a log Reinforced importance of adequate protein in diet  Reinforced bleeding risk due to cirrhosis Encouraged son to observe for any signs of infection, confusion and get medical treatment for infections asap Pain assessment completed  Patient Self Care Activities:  Attend all scheduled provider appointments Attend church or other social activities Call pharmacy for medication refills 3-7 days in advance of running out of medications Call provider office for new concerns or questions  Notify RN Care Manager of TOC call rescheduling needs Participate in Transition of Care Program/Attend TOC scheduled calls Perform all self care activities independently  Take medications as prescribed   Work with the social worker to address care coordination needs and will continue to work with the clinical team to address health care and disease management related needs Do not skip lactulose  Provide adequate protein in diet to help with cirrhosis Get emergency assistance for bleeding- rectal blood, dark black stools or vomiting of blood  Plan:  The patient has been provided with contact information for the care management team and has been advised to call with any health related questions or concerns.  Telephone outreach - 09/28/23 @ 2 pm        Patient verbalizes understanding of instructions and care plan provided today and agrees to view in MyChart. Active MyChart status and patient understanding of how to access instructions and care plan via MyChart confirmed with patient.     Telephone follow up appointment with care management team member scheduled for:  09/28/23 @ 2 pm  Please call the care guide team at [phone] if you need to cancel or reschedule your appointment.   Please call  the Suicide and Crisis Lifeline: 988 call the [place]  National Suicide Prevention Lifeline: (270)057-2654 or TTY: (406)050-2660 TTY (386) 358-7932) to talk to a trained counselor call 1-800-273-TALK (toll  free, 24 hour hotline) go to [facility] [address], [place] [phone]) call 911 if you are experiencing a Mental Health or Behavioral Health Crisis or need someone to talk to.  Mliss Creed [facility], BSN RN Care Manager/ Transition of Care [place]/ [facility] [phone]

## 2023-09-25 ENCOUNTER — Other Ambulatory Visit: Payer: Medicare (Managed Care) | Admitting: Licensed Clinical Social Worker

## 2023-09-25 NOTE — Patient Outreach (Signed)
 Complex Care Management   Visit Note  09/25/2023  Name:  [name] MRN: [id number] DOB: 15-Apr-1955  Situation: Referral received for Complex Care Management related to SDOH Barriers:  Medicaid application  I obtained verbal consent from Caregiver.  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 SW had previously mailed the clinic information . SW also made another attempt to contact the clinic to see if they could call the family.     SDOH Interventions    Flowsheet Row Patient Outreach from 09/25/2023 in [place] POPULATION HEALTH DEPARTMENT Clinical Support from 09/20/2023 in [facility] Telephone from 08/30/2023 in [place] POPULATION HEALTH DEPARTMENT Telephone from 08/17/2023 in [place] POPULATION HEALTH DEPARTMENT Patient Outreach from 06/09/2023 in [place] POPULATION HEALTH DEPARTMENT Telephone from 04/17/2023 in [place] POPULATION HEALTH DEPARTMENT  SDOH Interventions         Food Insecurity Interventions Intervention Not Indicated Intervention Not Indicated Intervention Not Indicated Intervention Not Indicated Intervention Not Indicated Intervention Not Indicated  Housing Interventions Intervention Not Indicated Intervention Not Indicated Intervention Not Indicated Intervention Not Indicated Intervention Not Indicated Intervention Not Indicated  Transportation Interventions Intervention Not Indicated Intervention Not Indicated Intervention Not Indicated Intervention Not Indicated Intervention Not Indicated Intervention Not Indicated, Patient Resources (Friends/Family)  Utilities Interventions Intervention Not Indicated Intervention Not Indicated Intervention Not Indicated Intervention Not Indicated Intervention Not Indicated Intervention Not Indicated  Alcohol Usage Interventions -- Intervention Not Indicated (Score <7) -- -- -- --  Financial Strain Interventions -- Intervention Not Indicated -- -- Intervention Not Indicated --  Physical Activity Interventions -- Intervention Not Indicated, Patient Declined -- -- -- --  Stress Interventions -- Intervention Not Indicated -- -- -- --  Social Connections Interventions -- Intervention Not Indicated -- -- -- --  Health Literacy Interventions -- Intervention Not Indicated -- -- -- --      Recommendation:   none  Follow Up Plan:   Telephone follow up appointment date/time:  10/10/2023 at 10:30 am  Tobias [name], PhD [facility], [facility] Social Worker Direct Dial: [phone]  Fax: [phone]

## 2023-09-25 NOTE — Patient Instructions (Signed)
 Visit Information  Thank you for taking time to visit with me today. Please don't hesitate to contact me if I can be of assistance to you before our next scheduled appointment.  Your next care management appointment is by telephone on 10/10/2023 at 10:30 am    Please call the care guide team at [phone] if you need to cancel, schedule, or reschedule an appointment.   Please call the Suicide and Crisis Lifeline: 988 go to [facility] [address] [place] [phone]) call 911 if you are experiencing a Mental Health or Behavioral Health Crisis or need someone to talk to.  Tobias [name], PhD [facility], [facility] Social Worker Direct Dial: [phone]  Fax: [phone]

## 2023-09-28 ENCOUNTER — Other Ambulatory Visit: Payer: Medicare (Managed Care) | Admitting: *Deleted

## 2023-09-28 DIAGNOSIS — K7460 Unspecified cirrhosis of liver: Secondary | ICD-10-CM

## 2023-09-28 NOTE — Patient Outreach (Signed)
 Transition of Care week 5  Visit Note  09/28/2023  Name: [name] MRN: [id number]          DOB: 05-30-1955  Situation: Patient enrolled in [facility] 30-day program. Visit completed with patient by telephone.   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   Hyperammonemia (HCC) 09/30/2022   Hypertension    Kidney stone    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   Obesity    Pulmonary emboli (HCC) 01/10/2009   Pulmonary embolism (HCC)    Short gut syndrome    Thyroid  disease     Assessment: Patient Reported Symptoms: Cognitive Cognitive Status: No symptoms reported, Alert and oriented to person, place, and time, Normal speech and language skills, Able to follow simple commands      Neurological Neurological Review of Symptoms: No symptoms reported    HEENT HEENT Symptoms Reported: No symptoms reported      Cardiovascular Cardiovascular Symptoms Reported: No symptoms reported    Respiratory Respiratory Symptoms Reported: No symptoms reported    Endocrine Endocrine Symptoms Reported: No symptoms reported Is patient diabetic?: No    Gastrointestinal Gastrointestinal Symptoms Reported: No symptoms reported Additional Gastrointestinal Details: pt continues having at least 5 bowel movements per day, taking lactulose  as prescribed/  cirrhosis Gastrointestinal Management Strategies: Adequate rest, Medication therapy Gastrointestinal  Self-Management Outcome: 3 (uncertain) Gastrointestinal Comment: reviewed importance of taking lactulose  as prescribed to keep ammonia levels in check    Genitourinary Genitourinary Symptoms Reported: No symptoms reported    Integumentary Integumentary Symptoms Reported: No symptoms reported    Musculoskeletal Musculoskelatal Symptoms Reviewed: Limited mobility Additional Musculoskeletal Details: walker, lift chair, WC ramp and now has power scooter Musculoskeletal Management Strategies: Medical device Musculoskeletal Self-Management Outcome: 4 (good) Musculoskeletal Comment: reviewed safety precautionis      Psychosocial Psychosocial Symptoms Reported: No symptoms reported         There were no vitals filed for this visit.  Medications Reviewed Today     Reviewed by Aura Mliss LABOR, RN (Registered Nurse) on 09/28/23 at 1402  Med List Status: <None>   Medication Order Taking? Sig Documenting Provider Last Dose Status Informant  albuterol  (VENTOLIN  HFA) 108 (90 Base) MCG/ACT inhaler [id number]  Inhale 2 puffs into the lungs every 6 (six) hours as needed for shortness of breath or wheezing. [provider]  Active Child  atorvastatin  (LIPITOR) 40 MG tablet [id number]  TAKE 1 TABLET(40 MG) BY MOUTH IN THE ERMALINDA [name], MD  Active   benzonatate  (TESSALON  PERLES) 100 MG capsule [id number]  Take 1 capsule (100 mg total) by mouth 3 (three) times daily as needed. [name], MD  Active   colchicine  0.6 MG tablet [id number]  Take 1 tablet (0.6 mg total) by mouth 2 (two) times daily. Jadine Toribio SQUIBB, MD  Active   copper  tablet [id number]  Take 2 mg by mouth in the morning and at bedtime. [provider]  Active  Child  cyanocobalamin  (VITAMIN B12) 1000 MCG tablet [id number]  Take 2,000 mcg by mouth every Monday, Wednesday, and Friday. [provider]  Active Child  dicyclomine  (BENTYL ) 10 MG capsule [id number]  Take 1 capsule (10 mg total) by mouth 3  (three) times daily as needed for spasms (abdominal cramping). [name], MD  Active Child  EPINEPHrine  0.3 mg/0.3 mL IJ SOAJ injection [id number]  Inject 0.3 mg into the muscle as needed for anaphylaxis. [name], MD  Active Child  famotidine  (PEPCID ) 20 MG tablet [id number]  Take 20 mg by mouth daily as needed for heartburn or indigestion. [provider]  Active Child  folic acid  (FOLVITE ) 1 MG tablet [id number]  Take 1 tablet (1 mg total) by mouth daily. Rizwan, [name], MD  Active Child  furosemide  (LASIX ) 40 MG tablet [id number]  Take 1 tablet (40 mg total) by mouth 2 (two) times daily. [name], MD  Active Child  HYDROcodone -acetaminophen  [facility]) 10-325 MG tablet [id number]  Take 1 tablet by mouth every 8 (eight) hours as needed for moderate pain (pain score 4-6). [provider]  Active Child  IRON -VITAMIN C  PO [id number]  Take 1 tablet by mouth daily with breakfast. [provider]  Active Child  lactulose  (CHRONULAC ) 10 GM/15ML solution [id number]  Take 45 mLs (30 g total) by mouth 3 (three) times daily. Take to ensure that there is 2 loose bowel movement EVERY DAY Cheryle Page, MD  Active Child  levothyroxine  (SYNTHROID ) 175 MCG tablet [id number]  TAKE 1 TABLET BY MOUTH DAILY A 6AM. RECHECK TSH WITH PCP IN 4 WEEKS [name], MD  Active   lidocaine  (LIDODERM ) 5 % [id number]  Place 1 patch onto the skin daily. Remove & Discard patch within 12 hours or as directed by MD  Patient not taking: Reported on 09/20/2023   Tobie Yetta HERO, MD  Active Child           Med Note [name]   Sat Dec 10, 2022 11:45 AM)    magnesium  gluconate (MAGONATE) 500 (27 Mg) MG TABS tablet [id number]  Take 1 tablet (500 mg total) by mouth in the morning and at bedtime. [name], MD  Active Child  meclizine  (ANTIVERT ) 25 MG tablet [id number]  Take 1 tablet (25 mg total) by mouth 3 (three) times daily as needed for dizziness. Cheryle Page, MD  Active Child   megestrol  (MEGACE ) 40 MG tablet [id number]  Take 1 tablet (40 mg total) by mouth 2 (two) times daily. Ajewole, [name], MD  Active Child  methocarbamol  (ROBAXIN ) 500 MG tablet [id number]  Take 500 mg by mouth in the morning. [provider]  Active Child  midodrine  (PROAMATINE ) 10 MG tablet [id number]  Take 1 tablet (10 mg total) by mouth 3 (three) times daily. [name], MD  Active   Multiple Vitamins-Minerals (CENTRUM SILVER ULTRA WOMENS PO) [id number]  Take 1 tablet by mouth in the morning. [provider]  Active Child  naloxone [facility]) nasal spray 4 mg/0.1 mL [id number]  Place 1 spray into the nose as needed (accidental overdose). [provider]  Active Child           Med Note ROLENE STALLION, [place] A   Wed Mar 08, 2023  2:39 PM)    nystatin  cream (MYCOSTATIN ) [id number]  Apply 1 Application topically 2 (two) times daily as needed for dry skin. Cheryle Page, MD  Active Child  pantoprazole  (PROTONIX ) 40 MG tablet [id number]  Take 1 tablet (40 mg total) by  mouth 2 (two) times daily before a meal. [name], MD  Active Child  potassium chloride  (KLOR-CON ) 10 MEQ tablet [id number]  Take 2 tablets (20 mEq total) by mouth 2 (two) times daily. [name], MD  Active Child  rifaximin  (XIFAXAN ) 550 MG TABS tablet [id number]  Take 550 mg by mouth 2 (two) times daily. [provider]  Active Child  rOPINIRole  (REQUIP ) 0.25 MG tablet [id number]  TAKE 1 TABLET(0.25 MG) BY MOUTH AT BEDTIME [name], MD  Active Child  senna-docusate (SENOKOT-S) 8.6-50 MG tablet [id number]  Take 1 tablet by mouth 2 (two) times daily. [name], MD  Active   spironolactone  (ALDACTONE ) 50 MG tablet [id number]  Take 1 tablet (50 mg total) by mouth daily. [name], MD  Active   triamcinolone  cream (KENALOG ) 0.1 % [id number]  Apply 1 Application topically 2 (two) times daily as needed (for itching- affected areas). [provider]  Active Child   venlafaxine  XR (EFFEXOR -XR) 75 MG 24 hr capsule [id number]  Take 1 capsule (75 mg total) by mouth daily with breakfast. [name], MD  Active Child  Vitamin D , Ergocalciferol , (DRISDOL) 50000 UNITS CAPS [id number]  Take 50,000 Units by mouth every Wednesday. [provider]  Active Child  Med List Note Rolene Evern Devere DELENA Bishop 03/08/23 1450): Pt's son Talasia Saulter helps with medications            Goals Addressed                        COMPLETED: VBCI Transitions of Care (TOC) Care Plan       Problems:  Recent Hospitalization for treatment of hepatic encephalopathy/ cirrhosis Spoke with DPR, patient's son [name] who reports patient  is feeling okay now since ammonia levels came down, reports compliance with lactulose  and pt having at least 6 bowel movements per day 09/28/23- no new concerns reported today, continues using lactulose  and having at least 5 bowel movements per day, would like to be followed by longitudinal RN Case Manager  Goal:  Over the next 30 days, the patient will not experience hospital readmission  Interventions:  Transitions of Care: Doctor Visits  - discussed the importance of doctor visits Reviewed Signs and symptoms of infection Evaluation of current treatment plan related to acute hepatic encephalopathy, cirrhosis, self-management and patient's adherence to plan as established by provider. Discussed plans with patient for ongoing care management follow up and provided patient with direct contact information for care management team Evaluation of current treatment plan related to cirrhosis  and patient's adherence to plan as established by provider Reviewed medications with patient and discussed importance of taking all medications as prescribed.  Reviewed compliance with lactulose  and importance of regular bowel movements to keep ammonia levels in check. Reviewed scheduled/upcoming provider appointments including primary care provider  11/08/23 Advised patient/ son to discuss frequent readmission with provider Encouraged son to assist to follow low salt diet since he provides meals Reviewed importance of daily weight and keeping a log Reinforced importance of adequate protein in diet  Reviewed bleeding risk due to cirrhosis Encouraged to observe for any signs of infection, confusion and get medical treatment for infections asap Pain assessment completed Placed order for transfer to longitudinal case management Reviewed importance of taking lactulose  as prescribed  Patient Self Care Activities:  Attend all scheduled provider appointments Attend church or other social activities Call pharmacy for medication refills 3-7 days in advance of running out of medications Call  provider office for new concerns or questions  Notify RN Care Manager of TOC call rescheduling needs Participate in Transition of Care Program/Attend TOC scheduled calls Perform all self care activities independently  Take medications as prescribed   Work with the social worker to address care coordination needs and will continue to work with the clinical team to address health care and disease management related needs Do not skip lactulose  Provide adequate protein in diet to help with cirrhosis Get emergency assistance for bleeding- rectal blood, dark black stools or vomiting of blood  Plan:  The patient has been provided with contact information for the care management team and has been advised to call with any health related questions or concerns.  Case closure TOC     transfer to longitudinal case management         Recommendation:   PCP Follow-up  Follow Up Plan:   Closing From:  Transitions of Care Program  Mliss Creed [facility], BSN RN Care Manager/ Transition of Care [place]/ [facility] [phone]

## 2023-09-28 NOTE — Patient Instructions (Signed)
 Visit Information  Thank you for taking time to visit with me today. Please don't hesitate to contact me if I can be of assistance to you before our next scheduled telephone appointment.  Following is a copy of your care plan:   Goals Addressed             This Visit's Progress               COMPLETED: VBCI Transitions of Care (TOC) Care Plan       Problems:  Recent Hospitalization for treatment of hepatic encephalopathy/ cirrhosis Spoke with DPR, patient's son [name] who reports patient  is feeling okay now since ammonia levels came down, reports compliance with lactulose  and pt having at least 6 bowel movements per day 09/28/23- no new concerns reported today, continues using lactulose  and having at least 5 bowel movements per day, would like to be followed by longitudinal RN Case Manager  Goal:  Over the next 30 days, the patient will not experience hospital readmission  Interventions:  Transitions of Care: Doctor Visits  - discussed the importance of doctor visits Reviewed Signs and symptoms of infection Evaluation of current treatment plan related to acute hepatic encephalopathy, cirrhosis, self-management and patient's adherence to plan as established by provider. Discussed plans with patient for ongoing care management follow up and provided patient with direct contact information for care management team Evaluation of current treatment plan related to cirrhosis  and patient's adherence to plan as established by provider Reviewed medications with patient and discussed importance of taking all medications as prescribed.  Reviewed compliance with lactulose  and importance of regular bowel movements to keep ammonia levels in check. Reviewed scheduled/upcoming provider appointments including primary care provider 11/08/23 Advised patient/ son to discuss frequent readmission with provider Encouraged son to assist to follow low salt diet since he provides meals Reviewed importance of  daily weight and keeping a log Reinforced importance of adequate protein in diet  Reviewed bleeding risk due to cirrhosis Encouraged to observe for any signs of infection, confusion and get medical treatment for infections asap Pain assessment completed Placed order for transfer to longitudinal case management Reviewed importance of taking lactulose  as prescribed  Patient Self Care Activities:  Attend all scheduled provider appointments Attend church or other social activities Call pharmacy for medication refills 3-7 days in advance of running out of medications Call provider office for new concerns or questions  Notify RN Care Manager of TOC call rescheduling needs Participate in Transition of Care Program/Attend TOC scheduled calls Perform all self care activities independently  Take medications as prescribed   Work with the social worker to address care coordination needs and will continue to work with the clinical team to address health care and disease management related needs Do not skip lactulose  Provide adequate protein in diet to help with cirrhosis Get emergency assistance for bleeding- rectal blood, dark black stools or vomiting of blood  Plan:  The patient has been provided with contact information for the care management team and has been advised to call with any health related questions or concerns.  Case closure TOC     transfer to longitudinal case management        Patient verbalizes understanding of instructions and care plan provided today and agrees to view in MyChart. Active MyChart status and patient understanding of how to access instructions and care plan via MyChart confirmed with patient.     No further follow up required: for TOC, will be followed  by longitudinal RN Care Manager  Please call the care guide team at [phone] if you need to cancel or reschedule your appointment.   Please call the Suicide and Crisis Lifeline: 988 call the [place]  National  Suicide Prevention Lifeline: (579) 204-2544 or TTY: 228-551-1726 TTY 704 739 9168) to talk to a trained counselor call 1-800-273-TALK (toll free, 24 hour hotline) go to [facility] [address], [place] [phone]) call 911 if you are experiencing a Mental Health or Behavioral Health Crisis or need someone to talk to.  Mliss Creed [facility], BSN RN Care Manager/ Transition of Care [place]/ [facility] [phone]

## 2023-10-02 ENCOUNTER — Ambulatory Visit: Payer: Medicare (Managed Care) | Admitting: Obstetrics and Gynecology

## 2023-10-06 ENCOUNTER — Other Ambulatory Visit: Payer: Self-pay

## 2023-10-06 DIAGNOSIS — R600 Localized edema: Secondary | ICD-10-CM

## 2023-10-06 MED ORDER — FUROSEMIDE 40 MG PO TABS: 40.0000 mg | ORAL_TABLET | Freq: Two times a day (BID) | ORAL | 1 refills | Status: DC

## 2023-10-08 ENCOUNTER — Inpatient Hospital Stay (HOSPITAL_COMMUNITY)
Admission: EM | Admit: 2023-10-08 | Discharge: 2023-10-23 | DRG: 442 | Disposition: A | Discharge status: Home Health | Payer: Medicare (Managed Care) | Attending: Internal Medicine | Admitting: Internal Medicine

## 2023-10-08 DIAGNOSIS — E66812 Obesity, class 2: Secondary | ICD-10-CM | POA: Diagnosis present

## 2023-10-08 DIAGNOSIS — Z79818 Long term (current) use of other agents affecting estrogen receptors and estrogen levels: Secondary | ICD-10-CM

## 2023-10-08 DIAGNOSIS — K90829 Short bowel syndrome, unspecified: Secondary | ICD-10-CM | POA: Diagnosis present

## 2023-10-08 DIAGNOSIS — K7682 Hepatic encephalopathy: Principal | ICD-10-CM | POA: Diagnosis present

## 2023-10-08 DIAGNOSIS — K7290 Hepatic failure, unspecified without coma: Secondary | ICD-10-CM | POA: Diagnosis not present

## 2023-10-08 DIAGNOSIS — I672 Cerebral atherosclerosis: Secondary | ICD-10-CM | POA: Diagnosis present

## 2023-10-08 DIAGNOSIS — E872 Acidosis, unspecified: Secondary | ICD-10-CM | POA: Diagnosis present

## 2023-10-08 DIAGNOSIS — Z91030 Bee allergy status: Secondary | ICD-10-CM

## 2023-10-08 DIAGNOSIS — D631 Anemia in chronic kidney disease: Secondary | ICD-10-CM | POA: Diagnosis present

## 2023-10-08 DIAGNOSIS — M549 Dorsalgia, unspecified: Secondary | ICD-10-CM | POA: Diagnosis not present

## 2023-10-08 DIAGNOSIS — E785 Hyperlipidemia, unspecified: Secondary | ICD-10-CM | POA: Diagnosis not present

## 2023-10-08 DIAGNOSIS — K7581 Nonalcoholic steatohepatitis (NASH): Secondary | ICD-10-CM | POA: Diagnosis present

## 2023-10-08 DIAGNOSIS — R4182 Altered mental status, unspecified: Secondary | ICD-10-CM | POA: Diagnosis not present

## 2023-10-08 DIAGNOSIS — I9589 Other hypotension: Secondary | ICD-10-CM | POA: Diagnosis present

## 2023-10-08 DIAGNOSIS — K7469 Other cirrhosis of liver: Secondary | ICD-10-CM | POA: Diagnosis present

## 2023-10-08 DIAGNOSIS — G2581 Restless legs syndrome: Secondary | ICD-10-CM | POA: Diagnosis present

## 2023-10-08 DIAGNOSIS — F419 Anxiety disorder, unspecified: Secondary | ICD-10-CM | POA: Diagnosis present

## 2023-10-08 DIAGNOSIS — D649 Anemia, unspecified: Secondary | ICD-10-CM | POA: Diagnosis not present

## 2023-10-08 DIAGNOSIS — E860 Dehydration: Secondary | ICD-10-CM | POA: Diagnosis present

## 2023-10-08 DIAGNOSIS — K766 Portal hypertension: Secondary | ICD-10-CM | POA: Diagnosis not present

## 2023-10-08 DIAGNOSIS — I129 Hypertensive chronic kidney disease with stage 1 through stage 4 chronic kidney disease, or unspecified chronic kidney disease: Secondary | ICD-10-CM | POA: Diagnosis present

## 2023-10-08 DIAGNOSIS — Z86718 Personal history of other venous thrombosis and embolism: Secondary | ICD-10-CM

## 2023-10-08 DIAGNOSIS — Z8249 Family history of ischemic heart disease and other diseases of the circulatory system: Secondary | ICD-10-CM

## 2023-10-08 DIAGNOSIS — G8929 Other chronic pain: Secondary | ICD-10-CM | POA: Diagnosis not present

## 2023-10-08 DIAGNOSIS — N179 Acute kidney failure, unspecified: Secondary | ICD-10-CM | POA: Diagnosis present

## 2023-10-08 DIAGNOSIS — Z79890 Hormone replacement therapy (postmenopausal): Secondary | ICD-10-CM

## 2023-10-08 DIAGNOSIS — Z6837 Body mass index (BMI) 37.0-37.9, adult: Secondary | ICD-10-CM

## 2023-10-08 DIAGNOSIS — N1831 Chronic kidney disease, stage 3a: Secondary | ICD-10-CM | POA: Diagnosis present

## 2023-10-08 DIAGNOSIS — Z9049 Acquired absence of other specified parts of digestive tract: Secondary | ICD-10-CM

## 2023-10-08 DIAGNOSIS — D684 Acquired coagulation factor deficiency: Secondary | ICD-10-CM | POA: Diagnosis present

## 2023-10-08 DIAGNOSIS — Z91041 Radiographic dye allergy status: Secondary | ICD-10-CM

## 2023-10-08 DIAGNOSIS — D689 Coagulation defect, unspecified: Secondary | ICD-10-CM | POA: Diagnosis present

## 2023-10-08 DIAGNOSIS — F331 Major depressive disorder, recurrent, moderate: Secondary | ICD-10-CM | POA: Diagnosis present

## 2023-10-08 DIAGNOSIS — Z79899 Other long term (current) drug therapy: Secondary | ICD-10-CM

## 2023-10-08 DIAGNOSIS — Z880 Allergy status to penicillin: Secondary | ICD-10-CM

## 2023-10-08 DIAGNOSIS — H547 Unspecified visual loss: Secondary | ICD-10-CM | POA: Diagnosis present

## 2023-10-08 DIAGNOSIS — Z87891 Personal history of nicotine dependence: Secondary | ICD-10-CM

## 2023-10-08 DIAGNOSIS — Z86711 Personal history of pulmonary embolism: Secondary | ICD-10-CM

## 2023-10-08 DIAGNOSIS — E039 Hypothyroidism, unspecified: Secondary | ICD-10-CM | POA: Diagnosis present

## 2023-10-08 DIAGNOSIS — Z888 Allergy status to other drugs, medicaments and biological substances status: Secondary | ICD-10-CM

## 2023-10-08 DIAGNOSIS — K7460 Unspecified cirrhosis of liver: Secondary | ICD-10-CM | POA: Diagnosis present

## 2023-10-08 DIAGNOSIS — Z881 Allergy status to other antibiotic agents status: Secondary | ICD-10-CM

## 2023-10-08 DIAGNOSIS — Z713 Dietary counseling and surveillance: Secondary | ICD-10-CM

## 2023-10-08 DIAGNOSIS — Z961 Presence of intraocular lens: Secondary | ICD-10-CM | POA: Diagnosis present

## 2023-10-08 DIAGNOSIS — F32A Depression, unspecified: Secondary | ICD-10-CM | POA: Diagnosis present

## 2023-10-08 DIAGNOSIS — Z83438 Family history of other disorder of lipoprotein metabolism and other lipidemia: Secondary | ICD-10-CM

## 2023-10-08 LAB — COMPREHENSIVE METABOLIC PANEL WITH GFR
ALT: 40 U/L (ref 0–44)
AST: 41 U/L (ref 15–41)
Albumin: 3.5 g/dL (ref 3.5–5.0)
Alkaline Phosphatase: 145 U/L — ABNORMAL HIGH (ref 38–126)
Anion gap: 13 (ref 5–15)
BUN: 26 mg/dL — ABNORMAL HIGH (ref 8–23)
CO2: 19 mmol/L — ABNORMAL LOW (ref 22–32)
Calcium: 9.4 mg/dL (ref 8.9–10.3)
Chloride: 107 mmol/L (ref 98–111)
Creatinine, Ser: 1.15 mg/dL — ABNORMAL HIGH (ref 0.44–1.00)
GFR, Estimated: 52 mL/min — ABNORMAL LOW (ref >60)
Glucose, Bld: 105 mg/dL — ABNORMAL HIGH (ref 70–99)
Potassium: 3.5 mmol/L (ref 3.5–5.1)
Sodium: 138 mmol/L (ref 135–145)
Total Bilirubin: 2.3 mg/dL — ABNORMAL HIGH (ref 0.0–1.2)
Total Protein: 6.2 g/dL — ABNORMAL LOW (ref 6.5–8.1)

## 2023-10-08 LAB — URINE DRUG SCREEN
Amphetamines: NEGATIVE
Barbiturates: NEGATIVE
Benzodiazepines: NEGATIVE
Cocaine: NEGATIVE
Fentanyl: NEGATIVE
Methadone Scn, Ur: NEGATIVE
Opiates: NEGATIVE
Tetrahydrocannabinol: NEGATIVE

## 2023-10-08 LAB — CBC WITH DIFFERENTIAL/PLATELET
Abs Immature Granulocytes: 0.03 K/uL (ref 0.00–0.07)
Basophils Absolute: 0.1 K/uL (ref 0.0–0.1)
Basophils Relative: 1 %
Eosinophils Absolute: 0.5 K/uL (ref 0.0–0.5)
Eosinophils Relative: 6 %
HCT: 45.1 % (ref 36.0–46.0)
Hemoglobin: 14 g/dL (ref 12.0–15.0)
Immature Granulocytes: 0 %
Lymphocytes Relative: 24 %
Lymphs Abs: 2 K/uL (ref 0.7–4.0)
MCH: 30.3 pg (ref 26.0–34.0)
MCHC: 31 g/dL (ref 30.0–36.0)
MCV: 97.6 fL (ref 80.0–100.0)
Monocytes Absolute: 0.8 K/uL (ref 0.1–1.0)
Monocytes Relative: 9 %
Neutro Abs: 5.1 K/uL (ref 1.7–7.7)
Neutrophils Relative %: 60 %
Platelets: 162 K/uL (ref 150–400)
RBC: 4.62 MIL/uL (ref 3.87–5.11)
RDW: 16.7 % — ABNORMAL HIGH (ref 11.5–15.5)
WBC: 8.6 K/uL (ref 4.0–10.5)
nRBC: 0 % (ref 0.0–0.2)

## 2023-10-08 LAB — URINALYSIS, ROUTINE W REFLEX MICROSCOPIC
Bacteria, UA: NONE SEEN
Bilirubin Urine: NEGATIVE
Glucose, UA: NEGATIVE mg/dL
Ketones, ur: NEGATIVE mg/dL
Leukocytes,Ua: NEGATIVE
Nitrite: NEGATIVE
Protein, ur: NEGATIVE mg/dL
Specific Gravity, Urine: 1 — ABNORMAL LOW (ref 1.005–1.030)
pH: 6 (ref 5.0–8.0)

## 2023-10-08 LAB — PROTIME-INR
INR: 1.2 (ref 0.8–1.2)
Prothrombin Time: 15.8 s — ABNORMAL HIGH (ref 11.4–15.2)

## 2023-10-08 LAB — ETHANOL: Alcohol, Ethyl (B): <15 mg/dL (ref <15)

## 2023-10-08 LAB — AMMONIA: Ammonia: 117 umol/L — ABNORMAL HIGH (ref 9–35)

## 2023-10-08 LAB — CBG MONITORING, ED: Glucose-Capillary: 90 mg/dL (ref 70–99)

## 2023-10-08 MED ORDER — RIFAXIMIN 550 MG PO TABS
550.0000 mg | ORAL_TABLET | Freq: Two times a day (BID) | ORAL | Status: DC
Start: 2023-10-08 — End: 2023-10-23
  Administered 2023-10-08 – 2023-10-23 (×29): 550 mg via ORAL
  Filled 2023-10-08 (×32): qty 1

## 2023-10-08 MED ORDER — POLYETHYLENE GLYCOL 3350 17 G PO PACK: 17.0000 g | PACK | Freq: Every day | ORAL | Status: DC | PRN

## 2023-10-08 MED ORDER — ADULT MULTIVITAMIN W/MINERALS CH
1.0000 | ORAL_TABLET | Freq: Every day | ORAL | Status: DC
  Administered 2023-10-09 – 2023-10-23 (×15): 1 via ORAL
  Filled 2023-10-08 (×15): qty 1

## 2023-10-08 MED ORDER — MIDODRINE HCL 5 MG PO TABS
10.0000 mg | ORAL_TABLET | Freq: Three times a day (TID) | ORAL | Status: DC
Start: 2023-10-09 — End: 2023-10-11
  Administered 2023-10-09 – 2023-10-11 (×5): 10 mg via ORAL
  Filled 2023-10-08 (×6): qty 2

## 2023-10-08 MED ORDER — SPIRONOLACTONE 25 MG PO TABS
50.0000 mg | ORAL_TABLET | Freq: Every day | ORAL | Status: DC
Start: 2023-10-09 — End: 2023-10-18
  Administered 2023-10-09 – 2023-10-17 (×9): 50 mg via ORAL
  Filled 2023-10-08 (×9): qty 2

## 2023-10-08 MED ORDER — PROCHLORPERAZINE EDISYLATE 10 MG/2ML IJ SOLN: 5.0000 mg | Freq: Four times a day (QID) | INTRAMUSCULAR | Status: DC | PRN

## 2023-10-08 MED ORDER — CENTRUM SILVER ULTRA WOMENS PO TABS: ORAL_TABLET | Freq: Every morning | ORAL | Status: DC

## 2023-10-08 MED ORDER — ALBUTEROL SULFATE (2.5 MG/3ML) 0.083% IN NEBU: 2.5000 mg | INHALATION_SOLUTION | Freq: Four times a day (QID) | RESPIRATORY_TRACT | Status: DC | PRN

## 2023-10-08 MED ORDER — LACTULOSE 10 GM/15ML PO SOLN
30.0000 g | Freq: Three times a day (TID) | ORAL | Status: DC
  Administered 2023-10-09 – 2023-10-18 (×23): 30 g via ORAL
  Filled 2023-10-08 (×24): qty 45

## 2023-10-08 MED ORDER — ALBUTEROL SULFATE HFA 108 (90 BASE) MCG/ACT IN AERS
2.0000 | INHALATION_SPRAY | Freq: Four times a day (QID) | RESPIRATORY_TRACT | Status: DC | PRN
Start: 2023-10-08 — End: 2023-10-08

## 2023-10-08 MED ORDER — SENNOSIDES-DOCUSATE SODIUM 8.6-50 MG PO TABS
1.0000 | ORAL_TABLET | Freq: Two times a day (BID) | ORAL | Status: DC
  Administered 2023-10-08 – 2023-10-23 (×29): 1 via ORAL
  Filled 2023-10-08 (×30): qty 1

## 2023-10-08 MED ORDER — HEPARIN SODIUM (PORCINE) 5000 UNIT/ML IJ SOLN
5000.0000 [IU] | Freq: Three times a day (TID) | INTRAMUSCULAR | Status: DC
  Administered 2023-10-08 – 2023-10-11 (×8): 5000 [IU] via SUBCUTANEOUS
  Filled 2023-10-08 (×9): qty 1

## 2023-10-08 MED ORDER — SODIUM CHLORIDE 0.9 % IV BOLUS
500.0000 mL | Freq: Once | INTRAVENOUS | Status: AC
  Administered 2023-10-08: 500 mL via INTRAVENOUS

## 2023-10-08 MED ORDER — LACTULOSE 10 GM/15ML PO SOLN
20.0000 g | Freq: Once | ORAL | Status: AC
  Administered 2023-10-08: 20 g via ORAL
  Filled 2023-10-08: qty 30

## 2023-10-08 MED ORDER — VENLAFAXINE HCL ER 75 MG PO CP24
75.0000 mg | ORAL_CAPSULE | Freq: Every day | ORAL | Status: DC
  Administered 2023-10-09 – 2023-10-22 (×14): 75 mg via ORAL
  Filled 2023-10-08 (×16): qty 1

## 2023-10-08 MED ORDER — LEVOTHYROXINE SODIUM 25 MCG PO TABS
175.0000 ug | ORAL_TABLET | Freq: Every day | ORAL | Status: DC
  Administered 2023-10-09 – 2023-10-23 (×15): 175 ug via ORAL
  Filled 2023-10-08 (×2): qty 1
  Filled 2023-10-08: qty 2
  Filled 2023-10-08 (×5): qty 1
  Filled 2023-10-08: qty 2
  Filled 2023-10-08 (×2): qty 1
  Filled 2023-10-08 (×2): qty 2
  Filled 2023-10-08: qty 1
  Filled 2023-10-08: qty 2
  Filled 2023-10-08: qty 1
  Filled 2023-10-08: qty 2
  Filled 2023-10-08 (×3): qty 1
  Filled 2023-10-08: qty 2
  Filled 2023-10-08: qty 1
  Filled 2023-10-08: qty 2

## 2023-10-08 NOTE — ED Notes (Signed)
 Pt assisted to a wheelchair and family helped her to the bathroom without any incidents.

## 2023-10-08 NOTE — ED Triage Notes (Addendum)
 BIB family member for increased confusion. Known issue of high ammonia levels or persistent UTI's. Has been out of the hospital for the last month for the same. Patient was at home and attempting to squat down in the middle of the hallway to use the bathroom thinking she was near a toilet.

## 2023-10-08 NOTE — ED Provider Triage Note (Signed)
 Emergency Medicine Provider Triage Evaluation Note  Kailana Benninger , a 68 y.o. female  was evaluated in triage.  Pt complains of having an episode, can't describe. Here with son, states more confused than normal, hx of cirrhosis and encephalopathy. Concern ammonia is elevated. Onset last night, increased lactulose  without improvement today.   Review of Systems  Positive:  Negative:   Physical Exam  BP 137/75 (BP Location: Right Arm)   Pulse 81   Temp 98.3 F (36.8 C) (Oral)   Resp 18   Ht 5' 2 (1.575 m)   Wt 93 kg   SpO2 98%   BMI 37.49 kg/m  Gen:   Awake, no distress   Resp:  Normal effort  MSK:   Moves extremities without difficulty  Other:    Medical Decision Making  Medically screening exam initiated at 5:51 PM.  Appropriate orders placed.  Hedda Crumbley was informed that the remainder of the evaluation will be completed by another provider, this initial triage assessment does not replace that evaluation, and the importance of remaining in the ED until their evaluation is complete.     Beverley Leita LABOR, PA-C 10/08/23 1752

## 2023-10-08 NOTE — H&P (Addendum)
 History and Physical  [name] FMW:[id number] DOB: Jul 21, 1955 DOA: 10/08/2023  Referring physician: Beverley Doffing, PA-EDP  PCP: [name], MD  Outpatient Specialists: GI. Patient coming from: Home.  Chief Complaint: Altered mental status.  HPI: [name] is a 68 y.o. female with medical history significant for hepatic NASH cirrhosis, history of hepatic encephalopathy, short gut syndrome, chronic diarrhea, CKD 3A, anemia of chronic disease, hypothyroidism, chronic anxiety/depression, chronic hypotension, hyperlipidemia, GERD, who presents to the ER due to altered mental status, since last night.  Attempted to defecate on the floor at her house thinking she was using the toilet.  Associated with decrease in stool output.  Her lactulose  dose was increased today without improvement.  The patient had similar symptoms 2 months ago and was admitted on 08/08/2023 for the same and discharged on 08/13/2023.  No reported fevers, or chills.  No reported nausea or vomiting.  In the ER, alert and mildly confused.  Ammonia level 117.  Alkaline phosphatase 145, T. Bili 2.3.  Serum bicarb 19, anion gap 13, BUN 26, creatinine 1.15.  The patient received 20 g of lactulose  and IV bolus 500 cc x 1 in the ER.  Admitted by [facility], hospitalist service.  ED Course: Temperature 98.2.  BP 123/73, pulse 80, respiration rate 17, O2 saturation 100% on room air.  Review of Systems: Review of systems as noted in the HPI. All other systems reviewed and are negative.   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   Hyperammonemia 09/30/2022   Hypertension    Kidney stone    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   Obesity    Pulmonary emboli (HCC) 01/10/2009   Pulmonary embolism (HCC)    Short gut syndrome    Thyroid  disease    Past Surgical History:  Procedure Laterality Date   ABDOMINAL WALL MESH  REMOVAL     2016   CESAREAN SECTION WITH BILATERAL TUBAL LIGATION  04/30/1985   CHOLECYSTECTOMY  05/2007   Exploratory laparotomy, lysis of adhesions, takedown of ileostomy with small bowel resection, open cholecystectomy and ileocolostomy   COLON SURGERY  01/2007   exploratory laparotomy with right colecotmy and end ileostomy   COLONOSCOPY  04/02/2019   Dr Gunnar Chris Daniels, MD [facility]   ESOPHAGOGASTRODUODENOSCOPY (EGD) WITH PROPOFOL  N/A 10/19/2022   Procedure: ESOPHAGOGASTRODUODENOSCOPY (EGD) WITH PROPOFOL ;  Surgeon: Saintclair Jasper, MD;  Location: [facility];  Service: Gastroenterology;  Laterality: N/A;   ESOPHAGOSCOPY  04/02/2019   EYE SURGERY Left 10/06/2020   Cataract extraciton w/intraocular lens implant- Dr Caresse   HERNIA REPAIR  10/2007   ILEOSTOMY  2009   states bowel perfotation wih colonsocopy   ILEOSTOMY CLOSURE  2009   INCISIONAL HERNIA REPAIR  10/11/2018   IR KYPHO THORACIC WITH BONE BIOPSY  08/31/2022   IR KYPHO THORACIC WITH BONE BIOPSY  10/27/2022   IR RADIOLOGIST EVAL & MGMT  09/27/2022   KIDNEY STONE SURGERY     LIVER  RESECTION     2009   PANNICULECTOMY     repair of abdominal wall hernia   WRIST SURGERY     tendonitis    Social History:  reports that she quit smoking about 38 years ago. Her smoking use included cigarettes. She has been exposed to tobacco smoke. She has never used smokeless tobacco. She reports that she does not drink alcohol and does not use drugs.   Allergies  Allergen Reactions   Ace Inhibitors Anaphylaxis and  Swelling   Gadolinium Derivatives Anaphylaxis   Ivp Dye [Iodinated Contrast Media] Anaphylaxis   Wound Dressing Adhesive Dermatitis   Desitin [Zinc  Oxide] Rash and Other (See Comments)    Worsens rash   Yellow Jacket Venom Swelling   Chlorhexidine  Rash and Other (See Comments)    WORSENS RASHES   Doxycycline Rash   Penicillins Dermatitis and Rash   Zofran  [Ondansetron ] Rash    Family History  Problem Relation Age of Onset   Uterine cancer Mother    Bone cancer Father    Heart disease Father    Hyperlipidemia Father    Dementia Father    Hypertension Father    Skin cancer Sister    Hypertension Sister    Diabetes Sister    Hyperlipidemia Brother    Hypertension Brother    Heart disease Brother       Prior to Admission medications   Medication Sig Start Date End Date Taking? Authorizing Provider  albuterol  (VENTOLIN  HFA) 108 (90 Base) MCG/ACT inhaler Inhale 2 puffs into the lungs every 6 (six) hours as needed for shortness of breath or wheezing. 02/13/23   [provider]  atorvastatin  (LIPITOR) 40 MG tablet TAKE 1 TABLET(40 MG) BY MOUTH IN THE MORNING 09/18/23   [name], MD  benzonatate  (TESSALON  PERLES) 100 MG capsule Take 1 capsule (100 mg total) by mouth 3 (three) times daily as needed. 09/04/23   [name], MD  colchicine  0.6 MG tablet Take 1 tablet (0.6 mg total) by mouth 2 (two) times daily. 08/29/23   Jadine Toribio SQUIBB, MD  copper  tablet Take 2 mg by mouth in the morning and at bedtime.    [provider]  cyanocobalamin  (VITAMIN B12) 1000 MCG tablet Take 2,000 mcg by mouth every Monday, Wednesday, and Friday.    [provider]  dicyclomine  (BENTYL ) 10 MG capsule Take 1 capsule (10 mg total) by mouth 3 (three) times daily as needed for spasms (abdominal cramping). 08/07/23   [name], MD  EPINEPHrine  0.3 mg/0.3 mL IJ SOAJ injection Inject 0.3 mg into the muscle as needed for anaphylaxis. 07/27/23   [name], MD   famotidine  (PEPCID ) 20 MG tablet Take 20 mg by mouth daily as needed for heartburn or indigestion.    [provider]  folic acid  (FOLVITE ) 1 MG tablet Take 1 tablet (1 mg total) by mouth daily. 06/16/22   Rizwan, [name], MD  furosemide  (LASIX ) 40 MG tablet Take 1 tablet (40 mg total) by mouth 2 (two) times daily. 10/06/23   [name], MD  HYDROcodone -acetaminophen  (NORCO) 10-325 MG tablet Take 1 tablet by mouth every 8 (eight) hours as needed for moderate pain (pain score 4-6). 06/15/23   [provider]  IRON -VITAMIN C  PO Take 1 tablet by mouth daily with breakfast.    [provider]  lactulose  (CHRONULAC ) 10 GM/15ML solution Take 45 mLs (30 g total) by mouth 3 (three) times daily. Take to ensure that there is 2 loose bowel movement EVERY  DAY 05/29/23   Cheryle Page, MD  levothyroxine  (SYNTHROID ) 175 MCG tablet TAKE 1 TABLET BY MOUTH DAILY A 6AM. RECHECK TSH WITH PCP IN 4 WEEKS 09/20/23   [name], MD  lidocaine  (LIDODERM ) 5 % Place 1 patch onto the skin daily. Remove & Discard patch within 12 hours or as directed by MD Patient not taking: Reported on 09/20/2023 10/31/22   Tobie Yetta HERO, MD  magnesium  gluconate (MAGONATE) 500 (27 Mg) MG TABS tablet Take 1 tablet (500 mg total) by mouth in the morning and at bedtime. 05/01/23   [name], MD  meclizine  (ANTIVERT ) 25 MG tablet Take 1 tablet (25 mg total) by mouth 3 (three) times daily as needed for dizziness. 05/29/23   Cheryle Page, MD  megestrol  (MEGACE ) 40 MG tablet Take 1 tablet (40 mg total) by mouth 2 (two) times daily. 05/04/23   Ajewole, [name], MD  methocarbamol  (ROBAXIN ) 500 MG tablet Take 500 mg by mouth in the morning.    [provider]  midodrine  (PROAMATINE ) 10 MG tablet Take 1 tablet (10 mg total) by mouth 3 (three) times daily. 09/12/23   [name], MD  Multiple Vitamins-Minerals (CENTRUM SILVER ULTRA WOMENS PO) Take 1 tablet by mouth in the morning.    [provider]   naloxone [facility]) nasal spray 4 mg/0.1 mL Place 1 spray into the nose as needed (accidental overdose). 03/25/22   [provider]  nystatin  cream (MYCOSTATIN ) Apply 1 Application topically 2 (two) times daily as needed for dry skin. 05/29/23   Cheryle Page, MD  pantoprazole  (PROTONIX ) 40 MG tablet Take 1 tablet (40 mg total) by mouth 2 (two) times daily before a meal. 06/09/23   [name], MD  potassium chloride  (KLOR-CON ) 10 MEQ tablet Take 2 tablets (20 mEq total) by mouth 2 (two) times daily. 08/07/23   [name], MD  rifaximin  (XIFAXAN ) 550 MG TABS tablet Take 550 mg by mouth 2 (two) times daily.    [provider]  rOPINIRole  (REQUIP ) 0.25 MG tablet TAKE 1 TABLET(0.25 MG) BY MOUTH AT BEDTIME 06/10/23   [name], MD  senna-docusate (SENOKOT-S) 8.6-50 MG tablet Take 1 tablet by mouth 2 (two) times daily. 09/12/23   [name], MD  spironolactone  (ALDACTONE ) 50 MG tablet Take 1 tablet (50 mg total) by mouth daily. 09/12/23   [name], MD  triamcinolone  cream (KENALOG ) 0.1 % Apply 1 Application topically 2 (two) times daily as needed (for itching- affected areas).    [provider]  venlafaxine  XR (EFFEXOR -XR) 75 MG 24 hr capsule Take 1 capsule (75 mg total) by mouth daily with breakfast. 07/20/23   [name], MD  Vitamin D , Ergocalciferol , (DRISDOL) 50000 UNITS CAPS Take 50,000 Units by mouth every Wednesday.    [provider]    Physical Exam: BP 139/81 (BP Location: Right Arm)   Pulse 83   Temp 98.2 F (36.8 C) (Oral)   Resp 17   Ht 5' 2 (1.575 m)   Wt 93 kg   SpO2 100%   BMI 37.49 kg/m   General: 68 y.o. year-old female well developed well nourished in no acute distress.  Alert and oriented x2. Cardiovascular: Regular rate and rhythm with no rubs or gallops.  No thyromegaly or JVD noted.  No lower extremity edema. 2/4 pulses in all 4 extremities. Respiratory: Clear to auscultation with no wheezes or rales. Good  inspiratory effort. Abdomen: Soft mild diffuse tenderness with moderate palpation.  Obese.  Nondistended with normal bowel sounds  x4 quadrants. Muskuloskeletal: No cyanosis, clubbing or edema noted bilaterally Neuro: CN II-XII intact, strength, sensation, reflexes Skin: No ulcerative lesions noted or rashes Psychiatry: Judgement and insight appear mildly altered. Mood is appropriate for condition and setting          Labs on Admission:  Basic Metabolic Panel: Recent Labs  Lab 10/08/23 1818  NA 138  K 3.5  CL 107  CO2 19*  GLUCOSE 105*  BUN 26*  CREATININE 1.15*  CALCIUM  9.4   Liver Function Tests: Recent Labs  Lab 10/08/23 1818  AST 41  ALT 40  ALKPHOS 145*  BILITOT 2.3*  PROT 6.2*  ALBUMIN  3.5   No results for input(s): LIPASE, AMYLASE in the last 168 hours. Recent Labs  Lab 10/08/23 1818  AMMONIA 117*   CBC: Recent Labs  Lab 10/08/23 1818  WBC 8.6  NEUTROABS 5.1  HGB 14.0  HCT 45.1  MCV 97.6  PLT 162   Cardiac Enzymes: No results for input(s): CKTOTAL, CKMB, CKMBINDEX, TROPONINI in the last 168 hours.  BNP (last 3 results) Recent Labs    01/03/23 0616 08/24/23 1544  BNP 469.4* 72.9    ProBNP (last 3 results) No results for input(s): PROBNP in the last 8760 hours.  CBG: Recent Labs  Lab 10/08/23 1815  GLUCAP 90    Radiological Exams on Admission: No results found.  EKG: I independently viewed the EKG done and my findings are as followed: None available at the time of this visit.  Assessment/Plan Present on Admission:  AMS (altered mental status)  Principal Problem:   AMS (altered mental status)  Hepatic encephalopathy, unclear trigger. Presented with confusion and hyperammonemia 117 Follow UA, treat as indicated. Resume home lactulose , rifaximin , and home spironolactone  Goal 2-3 loose stools per day  AKI, prerenal in the setting of dehydration from poor oral intake BUN 26, creatinine 1.15 on presentation.   Baseline creatinine 0.8 with GFR greater than 60 Received 500 cc IV fluid bolus NS in the ER. Avoid nephrotoxic agents, dehydration, and hypotension Monitor urine output. Resume feeding, low-sodium diet.  Non-anion gap metabolic acidosis Serum bicarb 19, anion gap of 13 Continue to treat underlying conditions Repeat CMP in the morning  Liver cirrhosis, NASH INR 1.2, T. bili 2.3 No reported overt bleeding Avoid hepatotoxic agents  Chronic hypotension Resume home midodrine  And closely monitor vital signs.  Hypothyroidism Resume home levothyroxine   Chronic anxiety/depression Resume home Effexor .  Obesity BMI 37 Recommend weight loss outpatient with regular physical activity and healthy diet.  Generalized weakness PT OT assessment Fall precautions.    Critical care time: 55 minutes.    DVT prophylaxis: Subcu heparin  3 times daily  Code Status: Full code.  Family Communication: No family members at bedside.  Disposition Plan: Admitted to telemetry unit.  Consults called: None.  Admission status: Inpatient status.   Status is: Inpatient The patient requires at least 2 midnights for further evaluation and treatment of present condition.   Terry LOISE Hurst MD [facility] Pager [phone]  If 7PM-7AM, please contact night-coverage [email or web address] Password [facility]  10/08/2023, 7:27 PM

## 2023-10-08 NOTE — ED Provider Notes (Signed)
 [place] EMERGENCY DEPARTMENT AT [facility] Provider Note   CSN: [id number] Arrival date & time: 10/08/23  1535     Patient presents with: Altered Mental Status   [name] is a 68 y.o. female.    68 y.o. female  was evaluated in triage.  Pt complains of having an episode, can't describe. Here with son, states more confused than normal, hx of cirrhosis and encephalopathy. Concern ammonia is elevated, states she typically has 5-7 bowel movements daily, has only had a few bowel movements today. Onset last night, increased lactulose  without improvement today. Son reports patient tried to squat and have a bowel movement on the floor, also used the actual roll of toilet paper to wipe rather than use a few pieces of the paper.        Prior to Admission medications   Medication Sig Start Date End Date Taking? Authorizing Provider  albuterol  (VENTOLIN  HFA) 108 (90 Base) MCG/ACT inhaler Inhale 2 puffs into the lungs every 6 (six) hours as needed for shortness of breath or wheezing. 02/13/23   [provider]  atorvastatin  (LIPITOR) 40 MG tablet TAKE 1 TABLET(40 MG) BY MOUTH IN THE MORNING 09/18/23   [name], MD  benzonatate  (TESSALON  PERLES) 100 MG capsule Take 1 capsule (100 mg total) by mouth 3 (three) times daily as needed. 09/04/23   [name], MD  colchicine  0.6 MG tablet Take 1 tablet (0.6 mg total) by mouth 2 (two) times daily. 08/29/23   Jadine Toribio SQUIBB, MD  copper  tablet Take 2 mg by mouth in the morning and at bedtime.    [provider]  cyanocobalamin  (VITAMIN B12) 1000 MCG tablet Take 2,000 mcg by mouth every Monday, Wednesday, and Friday.    [provider]  dicyclomine  (BENTYL ) 10 MG capsule Take 1 capsule (10 mg total) by mouth 3 (three) times daily as needed for spasms (abdominal cramping). 08/07/23   [name], MD  EPINEPHrine  0.3 mg/0.3 mL IJ SOAJ injection Inject 0.3 mg into the muscle as needed for anaphylaxis.  07/27/23   [name], MD  famotidine  (PEPCID ) 20 MG tablet Take 20 mg by mouth daily as needed for heartburn or indigestion.    [provider]  folic acid  (FOLVITE ) 1 MG tablet Take 1 tablet (1 mg total) by mouth daily. 06/16/22   Rizwan, [name], MD  furosemide  (LASIX ) 40 MG tablet Take 1 tablet (40 mg total) by mouth 2 (two) times daily. 10/06/23   [name], MD  HYDROcodone -acetaminophen  (NORCO) 10-325 MG tablet Take 1 tablet by mouth every 8 (eight) hours as needed for moderate pain (pain score 4-6). 06/15/23   [provider]  IRON -VITAMIN C  PO Take 1 tablet by mouth daily with breakfast.    [provider]  lactulose  (CHRONULAC ) 10 GM/15ML solution Take 45 mLs (30 g total) by mouth 3 (three) times daily. Take to ensure that there is 2 loose bowel movement EVERY DAY 05/29/23   Cheryle Page, MD  levothyroxine  (SYNTHROID ) 175 MCG tablet TAKE 1 TABLET BY MOUTH DAILY A 6AM. RECHECK TSH WITH PCP IN 4 WEEKS 09/20/23   [name], MD  lidocaine  (LIDODERM ) 5 % Place 1 patch onto the skin daily. Remove & Discard patch within 12 hours or as directed by MD Patient not taking: Reported on 09/20/2023 10/31/22   Tobie Yetta HERO, MD  magnesium  gluconate (MAGONATE) 500 (27 Mg) MG TABS tablet Take 1 tablet (500 mg total) by mouth in the morning and at bedtime.  05/01/23   [name], MD  meclizine  (ANTIVERT ) 25 MG tablet Take 1 tablet (25 mg total) by mouth 3 (three) times daily as needed for dizziness. 05/29/23   Cheryle Page, MD  megestrol  (MEGACE ) 40 MG tablet Take 1 tablet (40 mg total) by mouth 2 (two) times daily. 05/04/23   Ajewole, [name], MD  methocarbamol  (ROBAXIN ) 500 MG tablet Take 500 mg by mouth in the morning.    [provider]  midodrine  (PROAMATINE ) 10 MG tablet Take 1 tablet (10 mg total) by mouth 3 (three) times daily. 09/12/23   [name], MD  Multiple Vitamins-Minerals (CENTRUM SILVER ULTRA WOMENS PO) Take 1 tablet by mouth in the  morning.    [provider]  naloxone [facility]) nasal spray 4 mg/0.1 mL Place 1 spray into the nose as needed (accidental overdose). 03/25/22   [provider]  nystatin  cream (MYCOSTATIN ) Apply 1 Application topically 2 (two) times daily as needed for dry skin. 05/29/23   Cheryle Page, MD  pantoprazole  (PROTONIX ) 40 MG tablet Take 1 tablet (40 mg total) by mouth 2 (two) times daily before a meal. 06/09/23   [name], MD  potassium chloride  (KLOR-CON ) 10 MEQ tablet Take 2 tablets (20 mEq total) by mouth 2 (two) times daily. 08/07/23   [name], MD  rifaximin  (XIFAXAN ) 550 MG TABS tablet Take 550 mg by mouth 2 (two) times daily.    [provider]  rOPINIRole  (REQUIP ) 0.25 MG tablet TAKE 1 TABLET(0.25 MG) BY MOUTH AT BEDTIME 06/10/23   [name], MD  senna-docusate (SENOKOT-S) 8.6-50 MG tablet Take 1 tablet by mouth 2 (two) times daily. 09/12/23   [name], MD  spironolactone  (ALDACTONE ) 50 MG tablet Take 1 tablet (50 mg total) by mouth daily. 09/12/23   [name], MD  triamcinolone  cream (KENALOG ) 0.1 % Apply 1 Application topically 2 (two) times daily as needed (for itching- affected areas).    [provider]  venlafaxine  XR (EFFEXOR -XR) 75 MG 24 hr capsule Take 1 capsule (75 mg total) by mouth daily with breakfast. 07/20/23   [name], MD  Vitamin D , Ergocalciferol , (DRISDOL) 50000 UNITS CAPS Take 50,000 Units by mouth every Wednesday.    [provider]    Allergies: Ace inhibitors, Gadolinium derivatives, Ivp dye [iodinated contrast media], Wound dressing adhesive, Desitin [zinc  oxide], Yellow jacket venom, Chlorhexidine , Doxycycline, Penicillins, and Zofran  [ondansetron ]    Review of Systems Level 5 caveat for change in mental status  Updated Vital Signs BP 139/81 (BP Location: Right Arm)   Pulse 83   Temp 98.2 F (36.8 C) (Oral)   Resp 17   Ht 5' 2 (1.575 m)   Wt 93 kg   SpO2 100%   BMI 37.49 kg/m    Physical Exam Vitals and nursing note reviewed.  Constitutional:      General: She is not in acute distress.    Appearance: She is well-developed. She is obese. She is not diaphoretic.  HENT:     Head: Normocephalic and atraumatic.  Eyes:     Comments: Blind in left eye, chronic changes  Cardiovascular:     Rate and Rhythm: Normal rate and regular rhythm.     Heart sounds: Normal heart sounds.  Pulmonary:     Effort: Pulmonary effort is normal.     Breath sounds: Normal breath sounds.  Abdominal:     Palpations: Abdomen is soft.     Tenderness: There is no abdominal tenderness.  Musculoskeletal:  Right lower leg: Edema present.     Left lower leg: Edema present.  Skin:    General: Skin is warm and dry.     Findings: No erythema or rash.  Neurological:     General: No focal deficit present.     Mental Status: She is alert. She is disoriented.  Psychiatric:        Behavior: Behavior normal.     (all labs ordered are listed, but only abnormal results are displayed) Labs Reviewed  CBC WITH DIFFERENTIAL/PLATELET - Abnormal; Notable for the following components:      Result Value   RDW 16.7 (*)    All other components within normal limits  COMPREHENSIVE METABOLIC PANEL WITH GFR - Abnormal; Notable for the following components:   CO2 19 (*)    Glucose, Bld 105 (*)    BUN 26 (*)    Creatinine, Ser 1.15 (*)    Total Protein 6.2 (*)    Alkaline Phosphatase 145 (*)    Total Bilirubin 2.3 (*)    GFR, Estimated 52 (*)    All other components within normal limits  PROTIME-INR - Abnormal; Notable for the following components:   Prothrombin Time 15.8 (*)    All other components within normal limits  AMMONIA - Abnormal; Notable for the following components:   Ammonia 117 (*)    All other components within normal limits  ETHANOL  URINALYSIS, ROUTINE W REFLEX MICROSCOPIC  URINE DRUG SCREEN  CBG MONITORING, ED    EKG: None  Radiology: No results  found.   Procedures   Medications Ordered in the ED  lactulose  (CHRONULAC ) 10 GM/15ML solution 20 g (has no administration in time range)  sodium chloride  0.9 % bolus 500 mL (has no administration in time range)                                    Medical Decision Making Amount and/or Complexity of Data Reviewed Labs: ordered.   This patient presents to the ED for concern of confusion, this involves an extensive number of treatment options, and is a complaint that carries with it a high risk of complications and morbidity.  The differential diagnosis includes but not limited to hepatic encephalopathy, UTI, electrolyze/metabolic   Co morbidities / Chronic conditions that complicate the patient evaluation  Cirrhosis, encephalopathy    Additional history obtained:  Additional history obtained from EMR External records from outside source obtained and reviewed including prior labs and imaging on file   Lab Tests:  I Ordered, and personally interpreted labs.  The pertinent results include: CBC within normals.  CMP with slightly elevated creatinine 1.15 with increase in BUN, bicarb of 19 with normal gap, bilirubin is elevated at 2.3.  Glucose normal.  Alcohol is negative.  Ammonia is elevated 117.  INR normal.   Problem List / ED Course / Critical interventions / Medication management  68 year old female presents with son with concern for confusion similar to prior episodes of hepatic encephalopathy.  Last admitted 1 month ago for same.  Son tried to increase dose of lactulose  at home without improvement in symptoms.  Patient presents without complaint but states that she might be having one of her episodes.  Son provides history as above.  Her ammonia is found to be elevated.  Her abdomen is soft and nontender.  Patient is provided with lactulose , small IV fluid bolus given her lower extremity  edema and is admitted to the hospital service. I ordered medication including  lactulose  Reevaluation of the patient after these medicines showed that the patient remained stable I have reviewed the patients home medicines and have made adjustments as needed   Consultations Obtained:  I requested consultation with the hospitalist, Dr. Shona,  and discussed lab and imaging findings as well as pertinent plan - they recommend: will consult for admission  Case discussed with ER attending, Dr. Charlyn who agrees with plan of care.   Social Determinants of Health:  Lives with son   Test / Admission - Considered:  admit      Final diagnoses:  Hepatic encephalopathy [facility])    ED Discharge Orders     None          Beverley Leita DELENA DEVONNA 10/08/23 WINDELL Charlyn Sora, MD 10/08/23 2238

## 2023-10-09 DIAGNOSIS — K7682 Hepatic encephalopathy: Secondary | ICD-10-CM | POA: Diagnosis not present

## 2023-10-09 LAB — CBC
HCT: 41 % (ref 36.0–46.0)
Hemoglobin: 12.7 g/dL (ref 12.0–15.0)
MCH: 30.5 pg (ref 26.0–34.0)
MCHC: 31 g/dL (ref 30.0–36.0)
MCV: 98.3 fL (ref 80.0–100.0)
Platelets: 128 K/uL — ABNORMAL LOW (ref 150–400)
RBC: 4.17 MIL/uL (ref 3.87–5.11)
RDW: 16.5 % — ABNORMAL HIGH (ref 11.5–15.5)
WBC: 6.2 K/uL (ref 4.0–10.5)
nRBC: 0 % (ref 0.0–0.2)

## 2023-10-09 LAB — COMPREHENSIVE METABOLIC PANEL WITH GFR
ALT: 36 U/L (ref 0–44)
AST: 40 U/L (ref 15–41)
Albumin: 3 g/dL — ABNORMAL LOW (ref 3.5–5.0)
Alkaline Phosphatase: 121 U/L (ref 38–126)
Anion gap: 10 (ref 5–15)
BUN: 21 mg/dL (ref 8–23)
CO2: 19 mmol/L — ABNORMAL LOW (ref 22–32)
Calcium: 8.8 mg/dL — ABNORMAL LOW (ref 8.9–10.3)
Chloride: 109 mmol/L (ref 98–111)
Creatinine, Ser: 0.89 mg/dL (ref 0.44–1.00)
GFR, Estimated: >60 mL/min (ref >60)
Glucose, Bld: 90 mg/dL (ref 70–99)
Potassium: 3.6 mmol/L (ref 3.5–5.1)
Sodium: 138 mmol/L (ref 135–145)
Total Bilirubin: 1.8 mg/dL — ABNORMAL HIGH (ref 0.0–1.2)
Total Protein: 5.1 g/dL — ABNORMAL LOW (ref 6.5–8.1)

## 2023-10-09 LAB — PHOSPHORUS: Phosphorus: 3.1 mg/dL (ref 2.5–4.6)

## 2023-10-09 LAB — MAGNESIUM: Magnesium: 2.2 mg/dL (ref 1.7–2.4)

## 2023-10-09 MED ORDER — ACETAMINOPHEN 500 MG PO TABS
500.0000 mg | ORAL_TABLET | Freq: Four times a day (QID) | ORAL | Status: DC | PRN
  Administered 2023-10-09 – 2023-10-20 (×7): 500 mg via ORAL
  Filled 2023-10-09 (×9): qty 1

## 2023-10-09 MED ORDER — LACTULOSE 10 GM/15ML PO SOLN: 30.0000 g | Freq: Two times a day (BID) | ORAL | Status: DC | PRN

## 2023-10-09 MED ORDER — LIDOCAINE 5 % EX PTCH
1.0000 | MEDICATED_PATCH | Freq: Every day | CUTANEOUS | Status: DC
  Administered 2023-10-09 – 2023-10-23 (×15): 1 via TRANSDERMAL
  Filled 2023-10-09 (×14): qty 1

## 2023-10-09 MED ORDER — FUROSEMIDE 40 MG PO TABS
80.0000 mg | ORAL_TABLET | Freq: Every morning | ORAL | Status: DC
  Administered 2023-10-10 – 2023-10-18 (×9): 80 mg via ORAL
  Filled 2023-10-09 (×9): qty 2

## 2023-10-09 NOTE — Hospital Course (Addendum)
 35bn with h/o NASH cirrhosis with encephalopathy, short gut syndrome with chronic diarrhea, stage 3a CKD with anemia, hypothyroidism, anxiety/depression, chronic hypotension, and HLD who presented on 9/28 with AMS.  Elevated NH4, treated with lactulose  for hepatic encephalopathy.  Worsened after she was given narcotics, recurrent elevated NH4 now improving.

## 2023-10-09 NOTE — Progress Notes (Signed)
 Progress Note   Patient: [name] FMW:[id number] DOB: 1955/11/07 DOA: 10/08/2023     1 DOS: the patient was seen and examined on 10/09/2023   Brief hospital course: 68yo with h/o NASH cirrhosis with encephalopathy, short gut syndrome with chronic diarrhea, stage 3a CKD with anemia, hypothyroidism, anxiety/depression, chronic hypotension, and HLD who presented on 9/28 with AMS.  Elevated NH4, treated with lactulose  for hepatic encephalopathy.  Assessment and Plan:  Hepatic encephalopathy Patient with known cryptogenic/NASH cirrhosis which has been decompensated with encephalopathy Presented with confusion and hyperammonemia 117 Increase lactulose  to TID (previously BID) Resume rifaximin  and spironolactone  Goal 2-3 loose stools per day Still confused today but maybe somewhat more clear?   Cirrhosis MELD/MELD-Na score is 13/14, with a mortality rate of 6% Continue diuretics (furosemide , spironolactone ) and propranolol    AKI, prerenal in the setting of dehydration from poor oral intake Baseline creatinine 0.8, presenting creatinine 1.15 Appears to be back to baseline after 500 cc IV fluid bolus NS in the ER Avoid nephrotoxic agents, dehydration, and hypotension   Non-anion gap metabolic acidosis Serum bicarb 19, anion gap of 13 Continue to treat underlying conditions Repeat CMP in the morning   Liver cirrhosis, NASH INR 1.2, T. bili 2.3 No reported overt bleeding Avoid hepatotoxic agents   Chronic hypotension Resume midodrine  with hold parameters (SBP >120)   Hypothyroidism Resume levothyroxine   HLD Hold atorvastatin    Chronic anxiety/depression Resume venlafaxine    Obesity Body mass index is 37.49 kg/m.SABRA  Weight loss should be encouraged Outpatient PCP/bariatric medicine f/u encouraged Significantly low or high BMI is associated with higher medical risk including morbidity and mortality    Generalized weakness PT OT assessment Fall  precautions    Consultants: PT OT  Procedures: None  Antibiotics: None  30 Day Unplanned Readmission Risk Score    Flowsheet Row ED to Hosp-Admission (Current) from 10/08/2023 in [facility] [place] [facility]  30 Day Unplanned Readmission Risk Score (%) 66.66 Filed at 10/09/2023 0801    This score is the patient's risk of an unplanned readmission within 30 days of being discharged (0 -100%). The score is based on dignosis, age, lab data, medications, orders, and past utilization.   Low:  0-14.9   Medium: 15-21.9   High: 22-29.9   Extreme: 30 and above           Subjective: Somnolent, able to answer minimal questions.  I spoke with her son.  She has been doing everything the right way.  She usually has 5-7 soft stools a day.  She started getting harder stools and her son increased the lactulose  without improvement. Her mental status worsened significantly.   Objective: Vitals:   10/09/23 0955 10/09/23 1255  BP: (!) 130/91 (!) 141/84  Pulse: 86 72  Resp:  18  Temp:  98.4 F (36.9 C)  SpO2: 99% 100%   No intake or output data in the 24 hours ending 10/09/23 1417 Filed Weights   10/08/23 1547  Weight: 93 kg    Exam:  General:  Appears calm and comfortable and is in NAD, somnolent Eyes:  normal lids, iris ENT:  grossly normal hearing, lips & tongue, mmm Cardiovascular:  RRR. No LE edema.  Respiratory:   CTA bilaterally with no wheezes/rales/rhonchi.  Normal respiratory effort. Abdomen:  soft, NT, ND Skin:  no rash or induration seen on limited exam Musculoskeletal:  grossly normal tone BUE/BLE, good ROM, no bony abnormality Psychiatric:  somnolent mood and affect, speech sparse Neurologic:  unable to effectively perform  Data Reviewed: I have reviewed the patient's lab results since admission.  Pertinent labs for today include:   CO2 19, stable Albumin  3 Bili 1.8, improved Stable CBC     Family Communication: None present; I  spoke with her son by telephone   Mobility: PT/OT Consulted      Code Status: Full Code    Disposition: Status is: Inpatient Remains inpatient appropriate because: ongoing management     Time spent: 50 minutes  Unresulted Labs (From admission, onward)     Start     Ordered   Unscheduled  CBC with Differential/Platelet  Tomorrow morning,   R        10/09/23 1417   Unscheduled  Comprehensive metabolic panel with GFR  Tomorrow morning,   R        10/09/23 1417             Author: Delon Herald, MD 10/09/2023 2:17 PM  For on call review [email or web address].

## 2023-10-09 NOTE — Evaluation (Signed)
 Physical Therapy Evaluation Patient Details Name: [name] MRN: [id number] DOB: July 19, 1955 Today's Date: 10/09/2023  History of Present Illness  Patient is a 68 yo female presenting to the ED with AMS on 10/08/23. Admitted with same diagnosis, with noted elevated ammonia levels.  PMH includes decompensated cirrhosis, iron  deficiency anemia, anemia of chronic disease, cirrhosis, blind in L eye.  Clinical Impression  Pt admitted with above diagnosis. At baseline, pt resides with son.  She has excellent support and all necessary DME.  Pt typically ambulates with RW and has a w/c for getting out in community.  Pt familiar to PT services and generally progresses well.  Today, pt was lethargic but arousable and able to ambulate 31' with RW and chair follow for safety.  Pt expected to progress well and be able to return home.  Recommend HHPT.  Pt currently with functional limitations due to the deficits listed below (see PT Problem List). Pt will benefit from acute skilled PT to increase their independence and safety with mobility to allow discharge.           If plan is discharge home, recommend the following: A little help with walking and/or transfers;A little help with bathing/dressing/bathroom;Assistance with cooking/housework;Help with stairs or ramp for entrance   Can travel by private vehicle        Equipment Recommendations None recommended by PT  Recommendations for Other Services       Functional Status Assessment Patient has had a recent decline in their functional status and demonstrates the ability to make significant improvements in function in a reasonable and predictable amount of time.     Precautions / Restrictions Precautions Precautions: Fall Recall of Precautions/Restrictions: Impaired      Mobility  Bed Mobility Overal bed mobility: Needs Assistance Bed Mobility: Rolling, Sidelying to Sit Rolling: Contact guard assist Sidelying to sit: Min assist        General bed mobility comments: Min A to sit fully upright    Transfers Overall transfer level: Needs assistance Equipment used: Rolling walker (2 wheels) Transfers: Sit to/from Stand Sit to Stand: Min assist           General transfer comment: Min A to come into standing, cues for hand placement, and cues provided for pacing, and controlled sit in recliner    Ambulation/Gait Ambulation/Gait assistance: Min assist Gait Distance (Feet): 65 Feet Assistive device: Rolling walker (2 wheels) Gait Pattern/deviations: Step-to pattern, Decreased stride length Gait velocity: decreased     General Gait Details: Light min A at times for balance and turning RW; chair follow for safety but pt tolerated well  Stairs            Wheelchair Mobility     Tilt Bed    Modified Rankin (Stroke Patients Only)       Balance Overall balance assessment: Needs assistance Sitting-balance support: Feet supported Sitting balance-Leahy Scale: Fair     Standing balance support: Bilateral upper extremity supported, During functional activity, Reliant on assistive device for balance Standing balance-Leahy Scale: Poor Standing balance comment: reliant on RW                             Pertinent Vitals/Pain Pain Assessment Pain Assessment: Faces Faces Pain Scale: No hurt    Home Living Family/patient expects to be discharged to:: Private residence Living Arrangements: Children Available Help at Discharge: Family;Available 24 hours/day Type of Home: House Home Access: Ramped entrance  Home Layout: One level Home Equipment: Rollator (4 wheels);Rolling Walker (2 wheels);BSC/3in1;Other (comment);Hospital bed;Wheelchair - manual;Adaptive equipment;Hand held shower head Additional Comments: Pt lives with son [name] - very supportive, provides assist as needed; norleen present bedside for evaluation and education    Prior Function Prior Level of Function : Needs assist              Mobility Comments: son typically present for all mobility; uses RW in home, manual WC outside home ADLs Comments: Son has been assisting with ADLs lately. Pt states she was doing her own ADL tasks until recently     Extremity/Trunk Assessment   Upper Extremity Assessment Upper Extremity Assessment: Defer to OT evaluation    Lower Extremity Assessment Lower Extremity Assessment: Generalized weakness    Cervical / Trunk Assessment Cervical / Trunk Assessment: Kyphotic  Communication   Communication Communication: No apparent difficulties    Cognition Arousal: Lethargic Behavior During Therapy: WFL for tasks assessed/performed   PT - Cognitive impairments: Problem solving                         Following commands: Impaired Following commands impaired: Follows one step commands with increased time     Cueing       General Comments General comments (skin integrity, edema, etc.): VSS on RA    Exercises     Assessment/Plan    PT Assessment Patient needs continued PT services  PT Problem List Decreased strength;Decreased mobility;Decreased safety awareness;Decreased activity tolerance;Decreased balance;Decreased cognition       PT Treatment Interventions DME instruction;Therapeutic exercise;Gait training;Balance training;Functional mobility training;Therapeutic activities;Patient/family education    PT Goals (Current goals can be found in the Care Plan section)  Acute Rehab PT Goals Patient Stated Goal: return home PT Goal Formulation: With patient/family Time For Goal Achievement: 10/23/23 Potential to Achieve Goals: Good    Frequency Min 2X/week     Co-evaluation PT/OT/SLP Co-Evaluation/Treatment: Yes Reason for Co-Treatment: Complexity of the patient's impairments (multi-system involvement);For patient/therapist safety PT goals addressed during session: Mobility/safety with mobility OT goals addressed during session: ADL's and  self-care       AM-PAC PT 6 Clicks Mobility  Outcome Measure Help needed turning from your back to your side while in a flat bed without using bedrails?: A Little Help needed moving from lying on your back to sitting on the side of a flat bed without using bedrails?: A Little Help needed moving to and from a bed to a chair (including a wheelchair)?: A Little Help needed standing up from a chair using your arms (e.g., wheelchair or bedside chair)?: A Little Help needed to walk in hospital room?: A Little Help needed climbing 3-5 steps with a railing? : A Lot 6 Click Score: 17    End of Session Equipment Utilized During Treatment: Gait belt Activity Tolerance: Patient tolerated treatment well Patient left: in chair;with chair alarm set;with family/visitor present;with call bell/phone within reach Nurse Communication: Mobility status PT Visit Diagnosis: Muscle weakness (generalized) (M62.81);Other abnormalities of gait and mobility (R26.89)    Time: 1212-1230 PT Time Calculation (min) (ACUTE ONLY): 18 min   Charges:   PT Evaluation $PT Eval Low Complexity: 1 Low   PT General Charges $$ ACUTE PT VISIT: 1 Visit         Benjiman, PT Acute Rehab [facility] [phone]   Benjiman VEAR Mulberry 10/09/2023, 2:26 PM

## 2023-10-09 NOTE — Evaluation (Signed)
 Occupational Therapy Evaluation Patient Details Name: [name] MRN: [id number] DOB: 1955-06-08 Today's Date: 10/09/2023   History of Present Illness   Patient is a 68 yo female presenting to the ED with AMS on 10/08/23. Admitted with same diagnosis, with noted elevated ammonia levels.  PMH includes decompensated cirrhosis, iron  deficiency anemia, anemia of chronic disease, cirrhosis, blind in L eye.     Clinical Impressions Prior to this admission, patient living with her son, using a RW, and son assisting with ADLs when needed. Currently, patient lethargic, but alerted when awoken, and able to complete transfers with min A and ADLs with min-mod A. Patient continues to demonstrate confusion, but will follow commands with increased time. Per son, her cognition is clearing. OT will continue to follow acutely, with recommendation made for HHOT, but may progress past needing.      If plan is discharge home, recommend the following:   A little help with walking and/or transfers;A little help with bathing/dressing/bathroom;Assistance with cooking/housework;Direct supervision/assist for medications management;Direct supervision/assist for financial management;Assist for transportation;Help with stairs or ramp for entrance;Supervision due to cognitive status     Functional Status Assessment   Patient has had a recent decline in their functional status and demonstrates the ability to make significant improvements in function in a reasonable and predictable amount of time.     Equipment Recommendations   None recommended by OT     Recommendations for Other Services         Precautions/Restrictions   Precautions Precautions: Fall Recall of Precautions/Restrictions: Impaired Restrictions Weight Bearing Restrictions Per Provider Order: No     Mobility Bed Mobility Overal bed mobility: Needs Assistance Bed Mobility: Rolling, Sidelying to Sit Rolling: Contact guard  assist Sidelying to sit: Min assist       General bed mobility comments: Min A to sit fully upright    Transfers Overall transfer level: Needs assistance Equipment used: Rolling walker (2 wheels) Transfers: Sit to/from Stand Sit to Stand: Min assist           General transfer comment: Min A to come into standing, cues for hand placement, and cues provided for pacing, and controlled sit in recliner      Balance Overall balance assessment: Needs assistance Sitting-balance support: Bilateral upper extremity supported, Feet supported Sitting balance-Leahy Scale: Fair Sitting balance - Comments: did not challenge   Standing balance support: Bilateral upper extremity supported, During functional activity, Reliant on assistive device for balance Standing balance-Leahy Scale: Poor Standing balance comment: reliant on RW                           ADL either performed or assessed with clinical judgement   ADL Overall ADL's : Needs assistance/impaired Eating/Feeding: Set up;Sitting   Grooming: Set up;Sitting   Upper Body Bathing: Contact guard assist;Sitting   Lower Body Bathing: Moderate assistance;Sitting/lateral leans;Sit to/from stand   Upper Body Dressing : Contact guard assist;Sitting   Lower Body Dressing: Moderate assistance;Sit to/from stand;Sitting/lateral leans   Toilet Transfer: Minimal assistance;Ambulation;Rolling walker (2 wheels)   Toileting- Clothing Manipulation and Hygiene: Moderate assistance;Sitting/lateral lean;Sit to/from stand Toileting - Clothing Manipulation Details (indicate cue type and reason): for thoroughness     Functional mobility during ADLs: Minimal assistance;Moderate assistance;Rolling walker (2 wheels);Cueing for safety;Cueing for sequencing General ADL Comments: Prior to this admission, patient living with her son, using a RW, and son assisting with ADLs when needed. Currently, patient lethargic, but alerted when awoken, and  able to complete transfers with min A and ADLs with min-mod A. Patient continues to demonstrate confusion, but will follow commands with increased time. Per son, her cognition is clearing. OT will continue to follow acutely, with recommendation made for HHOT, but may progress past needing.     Vision Baseline Vision/History: 2 Legally blind (LEFT EYE) Ability to See in Adequate Light: 0 Adequate Patient Visual Report: No change from baseline Vision Assessment?: No apparent visual deficits     Perception Perception: Not tested       Praxis Praxis: Not tested       Pertinent Vitals/Pain Pain Assessment Pain Assessment: Faces Faces Pain Scale: No hurt Pain Intervention(s): Limited activity within patient's tolerance, Monitored during session, Repositioned     Extremity/Trunk Assessment Upper Extremity Assessment Upper Extremity Assessment: Generalized weakness;Right hand dominant   Lower Extremity Assessment Lower Extremity Assessment: Defer to PT evaluation   Cervical / Trunk Assessment Cervical / Trunk Assessment: Kyphotic   Communication Communication Communication: No apparent difficulties   Cognition Arousal: Lethargic Behavior During Therapy: Flat affect Cognition: Cognition impaired   Orientation impairments: Time Awareness: Online awareness impaired Memory impairment (select all impairments): Short-term memory, Working memory Attention impairment (select first level of impairment): Focused attention Executive functioning impairment (select all impairments): Organization, Sequencing, Reasoning, Problem solving, Initiation OT - Cognition Comments: Alerts when awoken, increased time to answer all questions, but would correct with cues                 Following commands: Impaired Following commands impaired: Follows one step commands with increased time     Cueing  General Comments   Cueing Techniques: Verbal cues  VSS on RA   Exercises     Shoulder  Instructions      Home Living Family/patient expects to be discharged to:: Private residence Living Arrangements: Children Available Help at Discharge: Family;Available 24 hours/day Type of Home: House Home Access: Ramped entrance     Home Layout: One level     Bathroom Shower/Tub: Walk-in shower;Sponge bathes at baseline   Bathroom Toilet: Standard Bathroom Accessibility: Yes   Home Equipment: Rollator (4 wheels);Rolling Walker (2 wheels);BSC/3in1;Other (comment);Hospital bed;Wheelchair - manual;Adaptive equipment;Hand held Clinical biochemist: Ship broker Additional Comments: Pt lives with son [name] - very supportive, provides assist as needed; norleen present bedside for evaluation and education      Prior Functioning/Environment Prior Level of Function : Needs assist             Mobility Comments: son typically present for all mobility; uses RW in home, manual WC outside home ADLs Comments: Son has been assisting with ADLs lately. Pt states she was doing her own ADL tasks until recently    OT Problem List: Decreased strength;Decreased activity tolerance;Impaired balance (sitting and/or standing);Decreased cognition;Decreased safety awareness;Obesity   OT Treatment/Interventions: Self-care/ADL training;Therapeutic exercise;DME and/or AE instruction;Manual therapy;Therapeutic activities;Patient/family education;Balance training;Energy conservation      OT Goals(Current goals can be found in the care plan section)   Acute Rehab OT Goals Patient Stated Goal: to get better OT Goal Formulation: With patient/family Time For Goal Achievement: 10/23/23 Potential to Achieve Goals: Good ADL Goals Pt Will Perform Lower Body Bathing: with set-up;sit to/from stand;sitting/lateral leans Pt Will Perform Lower Body Dressing: with set-up;sit to/from stand;sitting/lateral leans Pt Will Transfer to Toilet: with set-up;ambulating;regular height toilet Pt Will Perform  Toileting - Clothing Manipulation and hygiene: with set-up;sit to/from stand;sitting/lateral leans Additional ADL Goal #1: Patient will be able to demonstrate increased online  awareness in order to be able to return home safely with supervision. Additional ADL Goal #2: Patient will be able to complete functional task in standing for 3-5 minutes without need for seated rest break in order to increase overall activity tolerance.   OT Frequency:  Min 2X/week    Co-evaluation              AM-PAC OT 6 Clicks Daily Activity     Outcome Measure Help from another person eating meals?: A Little Help from another person taking care of personal grooming?: A Little Help from another person toileting, which includes using toliet, bedpan, or urinal?: A Lot Help from another person bathing (including washing, rinsing, drying)?: A Lot Help from another person to put on and taking off regular upper body clothing?: A Little Help from another person to put on and taking off regular lower body clothing?: A Lot 6 Click Score: 15   End of Session Equipment Utilized During Treatment: Gait belt;Rolling walker (2 wheels) Nurse Communication: Mobility status  Activity Tolerance: Patient tolerated treatment well Patient left: in chair;with call bell/phone within reach;with chair alarm set  OT Visit Diagnosis: Unsteadiness on feet (R26.81);Other abnormalities of gait and mobility (R26.89);Muscle weakness (generalized) (M62.81);History of falling (Z91.81);Other symptoms and signs involving cognitive function                Time: 1212-1230 OT Time Calculation (min): 18 min Charges:  OT General Charges $OT Visit: 1 Visit OT Evaluation $OT Eval Moderate Complexity: 1 Mod  Ronal Gift E. Keniel Ralston, OTR/L Acute Rehabilitation Services [phone]   Ronal Gift Salt 10/09/2023, 2:20 PM

## 2023-10-10 ENCOUNTER — Other Ambulatory Visit: Payer: Medicare (Managed Care) | Admitting: Licensed Clinical Social Worker

## 2023-10-10 ENCOUNTER — Encounter: Payer: Self-pay | Admitting: Licensed Clinical Social Worker

## 2023-10-10 DIAGNOSIS — K7682 Hepatic encephalopathy: Secondary | ICD-10-CM | POA: Diagnosis not present

## 2023-10-10 LAB — CBC WITH DIFFERENTIAL/PLATELET
Abs Immature Granulocytes: 0.02 K/uL (ref 0.00–0.07)
Basophils Absolute: 0 K/uL (ref 0.0–0.1)
Basophils Relative: 1 %
Eosinophils Absolute: 0.4 K/uL (ref 0.0–0.5)
Eosinophils Relative: 6 %
HCT: 36 % (ref 36.0–46.0)
Hemoglobin: 11.4 g/dL — ABNORMAL LOW (ref 12.0–15.0)
Immature Granulocytes: 0 %
Lymphocytes Relative: 30 %
Lymphs Abs: 1.8 K/uL (ref 0.7–4.0)
MCH: 31.4 pg (ref 26.0–34.0)
MCHC: 31.7 g/dL (ref 30.0–36.0)
MCV: 99.2 fL (ref 80.0–100.0)
Monocytes Absolute: 0.5 K/uL (ref 0.1–1.0)
Monocytes Relative: 8 %
Neutro Abs: 3.3 K/uL (ref 1.7–7.7)
Neutrophils Relative %: 55 %
Platelets: 128 K/uL — ABNORMAL LOW (ref 150–400)
RBC: 3.63 MIL/uL — ABNORMAL LOW (ref 3.87–5.11)
RDW: 16.7 % — ABNORMAL HIGH (ref 11.5–15.5)
WBC: 6.1 K/uL (ref 4.0–10.5)
nRBC: 0 % (ref 0.0–0.2)

## 2023-10-10 LAB — COMPREHENSIVE METABOLIC PANEL WITH GFR
ALT: 31 U/L (ref 0–44)
AST: 36 U/L (ref 15–41)
Albumin: 2.6 g/dL — ABNORMAL LOW (ref 3.5–5.0)
Alkaline Phosphatase: 113 U/L (ref 38–126)
Anion gap: 9 (ref 5–15)
BUN: 23 mg/dL (ref 8–23)
CO2: 19 mmol/L — ABNORMAL LOW (ref 22–32)
Calcium: 8.6 mg/dL — ABNORMAL LOW (ref 8.9–10.3)
Chloride: 116 mmol/L — ABNORMAL HIGH (ref 98–111)
Creatinine, Ser: 0.82 mg/dL (ref 0.44–1.00)
GFR, Estimated: >60 mL/min (ref >60)
Glucose, Bld: 121 mg/dL — ABNORMAL HIGH (ref 70–99)
Potassium: 3.5 mmol/L (ref 3.5–5.1)
Sodium: 144 mmol/L (ref 135–145)
Total Bilirubin: 1.8 mg/dL — ABNORMAL HIGH (ref 0.0–1.2)
Total Protein: 4.4 g/dL — ABNORMAL LOW (ref 6.5–8.1)

## 2023-10-10 MED ORDER — OXYCODONE HCL 5 MG PO TABS
5.0000 mg | ORAL_TABLET | Freq: Once | ORAL | Status: AC
  Administered 2023-10-10: 5 mg via ORAL
  Filled 2023-10-10: qty 1

## 2023-10-10 NOTE — Plan of Care (Signed)

## 2023-10-10 NOTE — Patient Outreach (Signed)
 10/10/2023  Pt was admitted to the hospital on 10/08/2023 and is currently still there. SW will move to another day to 10/24/2023 at 10:30 am SW notified the [facility] that has an appointment with the patient on tomorrow 10/11/2023.  Tobias [name], PhD [facility], [facility] Social Worker Direct Dial: [phone]  Fax: [phone]

## 2023-10-10 NOTE — Progress Notes (Signed)
 Mobility Specialist - Progress Note     10/10/23 1211  Mobility  Activity Ambulated with assistance  Level of Assistance Minimal assist, patient does 75% or more  Assistive Device Front wheel walker  Distance Ambulated (ft) 65 ft  Activity Response Tolerated well  Mobility Referral Yes  Mobility visit 1 Mobility  Mobility Specialist Start Time (ACUTE ONLY) 1150  Mobility Specialist Stop Time (ACUTE ONLY) 1210  Mobility Specialist Time Calculation (min) (ACUTE ONLY) 20 min   Pt was received in bed and agreed to mobility. Min A from STS. Returned to bed with all needs met. Left in room with son. Call bell in reach.  [employer] - Mobility Specialist

## 2023-10-10 NOTE — TOC Initial Note (Signed)
 Transition of Care [name]) - Initial/Assessment Note    Patient Details  Name: [name] MRN: [id number] Date of Birth: 1955/06/20  Transition of Care [facility]) CM/SW Contact:    Heather DELENA Saltness, LCSW Phone Number: 10/10/2023, 2:55 PM  Clinical Narrative:                 CSW spoke with pt's son, Dorina Ribaudo [phone], via phone call to discuss discharge planning and PT's recommendation for [facility] PT/OT. Pt's son reports pt has completed HH PT/OT in the past, but does not recall agency used. Pt's son reports he will look through paperwork at home to see which agency pt used previously. Pt's son will follow up with CSW with [facility] agency preference. Pt's son reports pt has RW and motorized scooter at home. Pt's son also inquired about water  aerobics classes for pt. CSW added water  aerobic agencies in [place]. CSW will await follow up from son regarding [facility] agency preference.    Expected Discharge Plan: Home w Home Health Services Barriers to Discharge: Continued Medical Work up   Patient Goals and CMS Choice Patient states their goals for this hospitalization and ongoing recovery are:: To return home        Expected Discharge Plan and Services In-house Referral: Clinical Social Work Discharge Planning Services: NA Post Acute Care Choice: Home Health Living arrangements for the past 2 months: Single Family Home                 DME Arranged: N/A DME Agency: NA         HH Agency: NA        Prior Living Arrangements/Services Living arrangements for the past 2 months: Single Family Home Lives with:: Self, Adult Children Patient language and need for interpreter reviewed:: Yes Do you feel safe going back to the place where you live?: Yes      Need for Family Participation in Patient Care: Yes (Comment) Care giver support system in place?: Yes (comment)   Criminal Activity/Legal Involvement Pertinent to Current Situation/Hospitalization: No - Comment as needed  Activities of Daily  Living   ADL Screening (condition at time of admission) Independently performs ADLs?: Yes (appropriate for developmental age)  Permission Sought/Granted Permission sought to share information with : Family Supports, Case Estate manager/land agent granted to share information with : Yes, Verbal Permission Granted  Share Information with NAME: Avagrace Botelho  Permission granted to share info w AGENCY: HH agency  Permission granted to share info w Relationship: Son  Permission granted to share info w Contact Information: [phone]  Emotional Assessment Appearance:: Appears stated age, Well-Groomed Attitude/Demeanor/Rapport: Unable to Assess Affect (typically observed): Unable to Assess Orientation: : Oriented to Self, Oriented to Place, Oriented to  Time, Oriented to Situation Alcohol / Substance Use: Not Applicable Psych Involvement: No (comment)  Admission diagnosis:  Hepatic encephalopathy (HCC) [K76.82] AMS (altered mental status) [R41.82] Patient Active Problem List   Diagnosis Date Noted   AMS (altered mental status) 10/08/2023   Acute gout 08/28/2023   Hepatic encephalopathy (HCC) 06/29/2023   Chronic kidney disease, stage 3a (HCC) 06/28/2023   Hyperammonemia 05/23/2023   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) 05/23/2023   Chronic idiopathic thrombocytopenia (HCC) 05/23/2023   Hyperlipidemia 05/23/2023   Generalized anxiety disorder 05/23/2023   Chronic anemia 05/23/2023   Acute metabolic encephalopathy 05/23/2023   Copper  deficiency 04/24/2023   ARF (acute renal failure) 03/30/2023   Depression 03/30/2023   Restless leg syndrome 03/30/2023   Pruritus 03/06/2023  Edema of both lower legs 03/06/2023   Insomnia 03/06/2023   Compression fracture of body of thoracic vertebra (HCC) 03/06/2023   Anemia of chronic disease 02/22/2023   Lumbar radiculopathy 12/10/2022   Grade I diastolic dysfunction 12/10/2022   History of DVT (deep vein thrombosis) 12/01/2022   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   Acute hepatic encephalopathy (HCC) 10/17/2022   Hypomagnesemia 10/08/2022   DNR (do not resuscitate)/DNI(Do Not intubate) 10/05/2022   Vaginal bleeding 10/05/2022   Physical deconditioning 08/17/2022   Decompensated cirrhosis (HCC) 08/17/2022   Chronic hypotension 08/11/2022   Unilateral primary osteoarthritis, left knee 08/01/2022   Folate deficiency 06/14/2022   Systolic murmur 06/12/2022   Iron  deficiency anemia 05/16/2022   Thrombocytopenia (HCC) - Baseline 100-130K 05/16/2022   History of eye surgery 05/01/2022   Corneal abnormality 04/29/2022   Status post eye surgery 04/29/2022   Corneal perforation of left eye 04/18/2022   Pseudophakia of left eye 02/08/2022   Physical debility 12/10/2021   Liver cirrhosis secondary to NASH (HCC) 10/03/2021   Polypharmacy 06/22/2021   Former smoker 01/06/2021   Combined form of age-related cataract, right eye 10/01/2020   Corneal scarring 09/10/2020   Cerebral atherosclerosis 08/20/2020   Short gut syndrome 08/20/2020   DDD (degenerative disc disease), lumbar 08/20/2020   Abdominal wall hernia 03/06/2020   Splenomegaly 02/06/2019   Vitamin B12 deficiency 01/21/2019   Hyperuricemia 08/25/2016   GERD (gastroesophageal reflux disease) 07/25/2016   Hypothyroidism 07/25/2016   Microcytic anemia 04/12/2016   Vitamin D  deficiency 05/04/2015   History of adenomatous polyp of colon 04/28/2015   H/O angioedema 11/15/2014   Chronic diarrhea 06/01/2014   PCP:  [name], MD Pharmacy:   [facility] [phone] - [place], [place] - [address] AT [facility] [address] [place] [address] Phone: [phone] Fax: [phone]     Social Drivers of Health (SDOH) Social History: SDOH Screenings   Food Insecurity: No Food Insecurity (10/09/2023)  Housing: Low Risk  (10/09/2023)  Transportation Needs: No Transportation Needs (10/09/2023)  Utilities: Not At Risk (10/09/2023)  Alcohol  Screen: Low Risk  (09/20/2023)  Depression (PHQ2-9): Medium Risk (09/20/2023)  Financial Resource Strain: Low Risk  (09/20/2023)  Physical Activity: Inactive (09/20/2023)  Social Connections: Unknown (10/09/2023)  Stress: No Stress Concern Present (09/20/2023)  Tobacco Use: Medium Risk (09/20/2023)  Health Literacy: Adequate Health Literacy (09/20/2023)   SDOH Interventions: Food Insecurity Interventions: Intervention Not Indicated   Readmission Risk Interventions    10/10/2023    2:52 PM 08/25/2023   10:04 AM 08/09/2023   12:37 PM  Readmission Risk Prevention Plan  Transportation Screening Complete Complete   Medication Review Oceanographer) Complete Complete Complete  PCP or Specialist appointment within 3-5 days of discharge Complete  Complete  HRI or Home Care Consult Complete Complete Complete  SW Recovery Care/Counseling Consult Complete Complete Complete  Palliative Care Screening Not Applicable Not Applicable Not Applicable  Skilled Nursing Facility Not Applicable Not Applicable Not Applicable    Signed: Heather Saltness, MSW, LCSW Clinical Social Worker Inpatient Care Management 10/10/2023 2:59 PM

## 2023-10-10 NOTE — Progress Notes (Signed)
 Progress Note   Patient: [name] FMW:[id number] DOB: May 06, 1955 DOA: 10/08/2023     2 DOS: the patient was seen and examined on 10/10/2023   Brief hospital course: 68yo with h/o NASH cirrhosis with encephalopathy, short gut syndrome with chronic diarrhea, stage 3a CKD with anemia, hypothyroidism, anxiety/depression, chronic hypotension, and HLD who presented on 9/28 with AMS.  Elevated NH4, treated with lactulose  for hepatic encephalopathy.  Assessment and Plan:  Hepatic encephalopathy Patient with known cryptogenic/NASH cirrhosis which has been decompensated with encephalopathy Presented with confusion and hyperammonemia 117 Increase lactulose  to TID (previously BID) Resume rifaximin  and spironolactone  Goal 2-3 loose stools per day Improved but not yet back to baseline   Cirrhosis MELD/MELD-Na score is 13/14, with a mortality rate of 6% Continue diuretics (furosemide , spironolactone ) and propranolol    AKI, prerenal in the setting of dehydration from poor oral intake Baseline creatinine 0.8, presenting creatinine 1.15 Appears to be back to baseline after 500 cc IV fluid bolus NS in the ER Avoid nephrotoxic agents, dehydration, and hypotension   Non-anion gap metabolic acidosis Serum bicarb 19, anion gap of 13 Continue to treat underlying conditions   Liver cirrhosis, NASH INR 1.2, T. bili 2.3 No reported overt bleeding Avoid hepatotoxic agents   Chronic hypotension Resume midodrine  with hold parameters (SBP >120) Midodrine  does not appear to be needed, as BP range has been 130/91-167/83   Hypothyroidism Resume levothyroxine    HLD Hold atorvastatin    Chronic anxiety/depression Resume venlafaxine    Obesity Body mass index is 37.49 kg/m.SABRA  Weight loss should be encouraged Outpatient PCP/bariatric medicine f/u encouraged Significantly low or high BMI is associated with higher medical risk including morbidity and mortality    Generalized weakness PT OT  assessment Fall precautions       Consultants: PT OT   Procedures: None   Antibiotics: None  30 Day Unplanned Readmission Risk Score    Flowsheet Row ED to Hosp-Admission (Current) from 10/08/2023 in [facility]  30 Day Unplanned Readmission Risk Score (%) 69.47 Filed at 10/10/2023 1200    This score is the patient's risk of an unplanned readmission within 30 days of being discharged (0 -100%). The score is based on dignosis, age, lab data, medications, orders, and past utilization.   Low:  0-14.9   Medium: 15-21.9   High: 22-29.9   Extreme: 30 and above           Subjective: Improved but not yet back to baseline mentation, not stooling consistently yet.   Objective: Vitals:   10/10/23 0624 10/10/23 1504  BP: (!) 167/83 (!) 140/69  Pulse: 68 77  Resp: 20 18  Temp: 98.8 F (37.1 C) 98.3 F (36.8 C)  SpO2: 100% 100%    Intake/Output Summary (Last 24 hours) at 10/10/2023 1600 Last data filed at 10/10/2023 1534 Gross per 24 hour  Intake 480 ml  Output --  Net 480 ml   Filed Weights   10/08/23 1547  Weight: 93 kg    Exam:  General:  Appears calm and comfortable and is in NAD, somnolent Eyes:  normal lids, iris ENT:  grossly normal hearing, lips & tongue, mmm Cardiovascular:  RRR. No LE edema.  Respiratory:   CTA bilaterally with no wheezes/rales/rhonchi.  Normal respiratory effort. Abdomen:  soft, NT, ND Skin:  no rash or induration seen on limited exam Musculoskeletal:  grossly normal tone BUE/BLE, good ROM, no bony abnormality Psychiatric:  somnolent mood and affect, speech sparse Neurologic:  unable to effectively perform  Data Reviewed: I have reviewed the patient's lab results since admission.  Pertinent labs for today include:   CO2 19 Glucose 121 Albumin  2.6 Bili 1.8 WBC 6.1 Hgb 11.4 Platelets 128     Family Communication: Family member was present throughout  Mobility: PT/OT Consulted and are  recommending - Home Health Pt9/29/2025 1424    Code Status: Full Code    Disposition: Status is: Inpatient Remains inpatient appropriate because: ongoing management     Time spent: 50 minutes  Unresulted Labs (From admission, onward)     Start     Ordered   10/11/23 0500  CBC with Differential/Platelet  Tomorrow morning,   R        10/10/23 1551   10/11/23 0500  Comprehensive metabolic panel with GFR  Tomorrow morning,   R        10/10/23 1551             Author: Delon Herald, MD 10/10/2023 4:00 PM  For on call review [email or web address].

## 2023-10-11 DIAGNOSIS — K7682 Hepatic encephalopathy: Secondary | ICD-10-CM | POA: Diagnosis not present

## 2023-10-11 LAB — CBC WITH DIFFERENTIAL/PLATELET
Abs Immature Granulocytes: 0.01 K/uL (ref 0.00–0.07)
Basophils Absolute: 0.1 K/uL (ref 0.0–0.1)
Basophils Relative: 1 %
Eosinophils Absolute: 0.4 K/uL (ref 0.0–0.5)
Eosinophils Relative: 5 %
HCT: 39.9 % (ref 36.0–46.0)
Hemoglobin: 12.1 g/dL (ref 12.0–15.0)
Immature Granulocytes: 0 %
Lymphocytes Relative: 26 %
Lymphs Abs: 1.9 K/uL (ref 0.7–4.0)
MCH: 31 pg (ref 26.0–34.0)
MCHC: 30.3 g/dL (ref 30.0–36.0)
MCV: 102.3 fL — ABNORMAL HIGH (ref 80.0–100.0)
Monocytes Absolute: 0.6 K/uL (ref 0.1–1.0)
Monocytes Relative: 8 %
Neutro Abs: 4.4 K/uL (ref 1.7–7.7)
Neutrophils Relative %: 60 %
Platelets: 136 K/uL — ABNORMAL LOW (ref 150–400)
RBC: 3.9 MIL/uL (ref 3.87–5.11)
RDW: 16.7 % — ABNORMAL HIGH (ref 11.5–15.5)
WBC: 7.2 K/uL (ref 4.0–10.5)
nRBC: 0 % (ref 0.0–0.2)

## 2023-10-11 LAB — COMPREHENSIVE METABOLIC PANEL WITH GFR
ALT: 36 U/L (ref 0–44)
AST: 52 U/L — ABNORMAL HIGH (ref 15–41)
Albumin: 2.8 g/dL — ABNORMAL LOW (ref 3.5–5.0)
Alkaline Phosphatase: 117 U/L (ref 38–126)
Anion gap: 11 (ref 5–15)
BUN: 24 mg/dL — ABNORMAL HIGH (ref 8–23)
CO2: 18 mmol/L — ABNORMAL LOW (ref 22–32)
Calcium: 8.9 mg/dL (ref 8.9–10.3)
Chloride: 113 mmol/L — ABNORMAL HIGH (ref 98–111)
Creatinine, Ser: 0.92 mg/dL (ref 0.44–1.00)
GFR, Estimated: >60 mL/min (ref >60)
Glucose, Bld: 102 mg/dL — ABNORMAL HIGH (ref 70–99)
Potassium: 3.6 mmol/L (ref 3.5–5.1)
Sodium: 142 mmol/L (ref 135–145)
Total Bilirubin: 2 mg/dL — ABNORMAL HIGH (ref 0.0–1.2)
Total Protein: 5 g/dL — ABNORMAL LOW (ref 6.5–8.1)

## 2023-10-11 MED ORDER — OXYCODONE HCL 5 MG PO TABS
2.5000 mg | ORAL_TABLET | Freq: Two times a day (BID) | ORAL | Status: DC | PRN
  Administered 2023-10-11 – 2023-10-23 (×7): 2.5 mg via ORAL
  Filled 2023-10-11 (×9): qty 1

## 2023-10-11 MED ORDER — MIDODRINE HCL 5 MG PO TABS
2.5000 mg | ORAL_TABLET | Freq: Three times a day (TID) | ORAL | Status: DC
Start: 2023-10-11 — End: 2023-10-18
  Administered 2023-10-11 – 2023-10-17 (×17): 2.5 mg via ORAL
  Filled 2023-10-11 (×21): qty 1

## 2023-10-11 MED ORDER — OXYCODONE HCL 5 MG PO TABS: 5.0000 mg | ORAL_TABLET | Freq: Four times a day (QID) | ORAL | Status: DC | PRN

## 2023-10-11 MED ORDER — LIDOCAINE 5 % EX PTCH
1.0000 | MEDICATED_PATCH | CUTANEOUS | Status: DC
  Administered 2023-10-11 – 2023-10-22 (×10): 1 via TRANSDERMAL
  Filled 2023-10-11 (×10): qty 1

## 2023-10-11 NOTE — Progress Notes (Signed)
 Occupational Therapy Treatment Patient Details Name: [name] MRN: [id number] DOB: 14-Sep-1955 Today's Date: 10/11/2023   History of present illness Patient is a 68 yo female presenting to the ED with AMS on 10/08/23. Admitted with same diagnosis, with noted elevated ammonia levels.  PMH includes decompensated cirrhosis, iron  deficiency anemia, anemia of chronic disease, cirrhosis, blind in L eye.   OT comments  Patient seen for further assessment of cognition and ADL management. Patient recently ambulating this afternoon therefore declining further ambulation. Patient able to complete bed mobility at [facility], and then set up for ADL grooming tasks. Patient able to recall what she had for lunch (egg salad) but then stating she would make it better at home. OT asking patient what the ingredients would be, with patient only able to state egg and could not recall any further ingredients. However, patient's cognition is improving. OT recommendation remains appropriate; will continue to follow.       If plan is discharge home, recommend the following:  A little help with walking and/or transfers;A little help with bathing/dressing/bathroom;Assistance with cooking/housework;Direct supervision/assist for medications management;Direct supervision/assist for financial management;Assist for transportation;Help with stairs or ramp for entrance;Supervision due to cognitive status   Equipment Recommendations  None recommended by OT    Recommendations for Other Services      Precautions / Restrictions Precautions Precautions: Fall Recall of Precautions/Restrictions: Impaired Restrictions Weight Bearing Restrictions Per Provider Order: No       Mobility Bed Mobility Overal bed mobility: Needs Assistance Bed Mobility: Supine to Sit, Sit to Supine     Supine to sit: Supervision Sit to supine: Supervision   General bed mobility comments: minimal increased time    Transfers Overall transfer  level: Needs assistance                 General transfer comment: declining this session     Balance Overall balance assessment: Needs assistance                                         ADL either performed or assessed with clinical judgement   ADL Overall ADL's : Needs assistance/impaired     Grooming: Set up;Sitting;Wash/dry hands;Wash/dry face;Oral care                               Functional mobility during ADLs: Minimal assistance;Moderate assistance;Rolling walker (2 wheels);Cueing for safety;Cueing for sequencing General ADL Comments: Patient seen for further assessment of cognition and ADL management. Patient recently ambulating this afternoon therefore declining further ambulation. Patient able to complete bed mobility at [facility], and then set up for ADL grooming tasks. Patient able to recall what she had for lunch (egg salad) but then stating she would make it better at home. OT asking patient what the ingredients would be, with patient only able to state egg and could not recall any further ingredients. However, patient's cognition is improving. OT recommendation remains appropriate; will continue to follow.    Extremity/Trunk Assessment         Cervical / Trunk Assessment Cervical / Trunk Assessment: Kyphotic    Vision Baseline Vision/History: 2 Legally blind (LEFT EYE) Ability to See in Adequate Light: 0 Adequate Patient Visual Report: No change from baseline Vision Assessment?: No apparent visual deficits   Perception Perception Perception: Not tested   Praxis Praxis Praxis: Not  tested   Communication Communication Communication: No apparent difficulties   Cognition Arousal: Alert Behavior During Therapy: WFL for tasks assessed/performed Cognition: Cognition impaired     Awareness: Online awareness impaired Memory impairment (select all impairments): Short-term memory, Working memory Attention impairment (select first  level of impairment): Sustained attention Executive functioning impairment (select all impairments): Organization, Sequencing, Reasoning, Problem solving, Initiation OT - Cognition Comments: Improved cognition, working memory with continued deficits, could not verbalize the ingredients for egg salad                 Following commands: Impaired, Intact Following commands impaired: Only follows one step commands consistently      Cueing   Cueing Techniques: Verbal cues  Exercises      Shoulder Instructions       General Comments VSS on RA    Pertinent Vitals/ Pain       Pain Assessment Pain Assessment: 0-10 Pain Score: 4  Faces Pain Scale: No hurt Pain Location: neck Pain Descriptors / Indicators: Aching Pain Intervention(s): Limited activity within patient's tolerance, Monitored during session, Repositioned  Home Living Family/patient expects to be discharged to:: Private residence Living Arrangements: Children Available Help at Discharge: Family;Available 24 hours/day Type of Home: House Home Access: Ramped entrance     Home Layout: One level     Bathroom Shower/Tub: Walk-in shower;Sponge bathes at baseline   Bathroom Toilet: Standard Bathroom Accessibility: Yes   Home Equipment: Rollator (4 wheels);Rolling Walker (2 wheels);BSC/3in1;Other (comment);Hospital bed;Wheelchair - manual;Adaptive equipment;Hand held Clinical biochemist: Ship broker Additional Comments: Pt lives with son [name] - very supportive, provides assist as needed; norleen present bedside for evaluation and education      Prior Functioning/Environment              Frequency  Min 2X/week        Progress Toward Goals  OT Goals(current goals can now be found in the care plan section)  Progress towards OT goals: Progressing toward goals  Acute Rehab OT Goals Patient Stated Goal: to get to go home soon OT Goal Formulation: With patient/family Time For Goal  Achievement: 10/23/23 Potential to Achieve Goals: Good  Plan      Co-evaluation                 AM-PAC OT 6 Clicks Daily Activity     Outcome Measure   Help from another person eating meals?: A Little Help from another person taking care of personal grooming?: A Little Help from another person toileting, which includes using toliet, bedpan, or urinal?: A Lot Help from another person bathing (including washing, rinsing, drying)?: A Lot Help from another person to put on and taking off regular upper body clothing?: A Little Help from another person to put on and taking off regular lower body clothing?: A Lot 6 Click Score: 15    End of Session    OT Visit Diagnosis: Unsteadiness on feet (R26.81);Other abnormalities of gait and mobility (R26.89);Muscle weakness (generalized) (M62.81);History of falling (Z91.81);Other symptoms and signs involving cognitive function   Activity Tolerance Patient tolerated treatment well   Patient Left in bed;with bed alarm set;with call bell/phone within reach   Nurse Communication Mobility status        Time: 1440-1452 OT Time Calculation (min): 12 min  Charges: OT General Charges $OT Visit: 1 Visit OT Treatments $Self Care/Home Management : 8-22 mins  Ronal Gift E. Pati Thinnes, OTR/L Acute Rehabilitation Services [phone]   Ronal Gift Salt 10/11/2023, 3:11  PM

## 2023-10-11 NOTE — Progress Notes (Signed)
 PROGRESS NOTE    [name]  FMW:[id number] DOB: 12/23/1955 DOA: 10/08/2023 PCP: [name], MD   Brief Narrative: 68 year old with past medical history significant for [name] with encephalopathy, chart Syndrome with chronic diarrhea, stage IIIa CKD with anemia, hypothyroidism, anxiety/depression, chronic hypotension and hyperlipidemia who presented 9/28 with altered mental status, elevated NIH 4 treated with lactulose  for hepatic encephalopathy.   Assessment & Plan:   Principal Problem:   AMS (altered mental status)  1-Acute hepatic encephalopathy: - Patient presented with confusion and hyperammonemia ammonia level at 117.  With known history of cryptogenic NASH cirrhosis. Continue rifaximin  Continue lactulose  30 mg 3 times daily previously twice daily Has had 3 bowel movement today so far, yesterday only had 1. Patient still seems sleepy and is still confused per son who is at bedside.  Cirrhosis, NASH Continue with spironolactone  furosemide  and propranolol INR 1.2 bilirubin 2  AKI Received IV fluids in the ED, creatinine peaked to 1.1 Monitor on diuretics  None anion gap metabolic acidosis: Monitor tx underlying conditions  Chronic hypotension Blood pressure has been elevated, plan to reduce midodrine  to 2.5  Hypothyroidism: Continue Synthroid   Hyperlipidemia: Holding statins  Chronic pain: Patient takes hydrocodone  at home. Patient and son asking for pain medication. Received oxycodone  last night. Will order low-dose oxycodone , hold for sedation, will avoid given during the day to avoid oversedation Lidocaine  patch applied to the neck  Anxiety/depression Continue velafaxine.  Generalized weakness: PT OT consulted home health to be arranged Obesity type I Need lifestyle modifications   Estimated body mass index is 37.49 kg/m as calculated from the following:   Height as of this encounter: 5' 2 (1.575 m).   Weight as of this encounter: 93  kg.   DVT prophylaxis: Will order SCDs, hold heparin  due to history of cirrhosis Code Status: Full code Family Communication: Son who was at bedside Disposition Plan:  Status is: Inpatient Remains inpatient appropriate because: Management of acute encephalopathy    Consultants:  none  Procedures:  none  Antimicrobials:    Subjective: She is sleepy she wake up open eyes follows commands.  Complaining of neck pain. She has had 3 bowel movements so far today.  Per son yesterday she only had 1 bowel movement.  Objective: Vitals:   10/10/23 0624 10/10/23 1504 10/10/23 2107 10/11/23 0601  BP: (!) 167/83 (!) 140/69 128/86 (!) 150/91  Pulse: 68 77 84 76  Resp: 20 18 17 17   Temp: 98.8 F (37.1 C) 98.3 F (36.8 C) 98.2 F (36.8 C) 98.1 F (36.7 C)  TempSrc: Oral Oral Oral Oral  SpO2: 100% 100% 100% 100%  Weight:      Height:        Intake/Output Summary (Last 24 hours) at 10/11/2023 9177 Last data filed at 10/10/2023 1534 Gross per 24 hour  Intake 480 ml  Output --  Net 480 ml   Filed Weights   10/08/23 1547  Weight: 93 kg    Examination:  General exam: Appears calm and comfortable  Respiratory system: Clear to auscultation. Respiratory effort normal. Cardiovascular system: S1 & S2 heard, RRR. No JVD, murmurs, rubs, gallops or clicks. No pedal edema. Gastrointestinal system: Abdomen is nondistended, soft and nontender. No organomegaly or masses felt. Normal bowel sounds heard. Central nervous system: Alert and oriented. No focal neurological deficits. Extremities: Symmetric 5 x 5 power. Skin: No rashes, lesions or ulcers Psychiatry: Judgement and insight appear normal. Mood & affect appropriate.     Data Reviewed: I have  personally reviewed following labs and imaging studies  CBC: Recent Labs  Lab 10/08/23 1818 10/09/23 0524 10/10/23 0617 10/11/23 0524  WBC 8.6 6.2 6.1 7.2  NEUTROABS 5.1  --  3.3 4.4  HGB 14.0 12.7 11.4* 12.1  HCT 45.1 41.0 36.0 39.9   MCV 97.6 98.3 99.2 102.3*  PLT 162 128* 128* 136*   Basic Metabolic Panel: Recent Labs  Lab 10/08/23 1818 10/09/23 0524 10/10/23 0617 10/11/23 0524  NA 138 138 144 142  K 3.5 3.6 3.5 3.6  CL 107 109 116* 113*  CO2 19* 19* 19* 18*  GLUCOSE 105* 90 121* 102*  BUN 26* 21 23 24*  CREATININE 1.15* 0.89 0.82 0.92  CALCIUM  9.4 8.8* 8.6* 8.9  MG  --  2.2  --   --   PHOS  --  3.1  --   --    GFR: Estimated Creatinine Clearance: 63.9 mL/min (by C-G formula based on SCr of 0.92 mg/dL). Liver Function Tests: Recent Labs  Lab 10/08/23 1818 10/09/23 0524 10/10/23 0617 10/11/23 0524  AST 41 40 36 52*  ALT 40 36 31 36  ALKPHOS 145* 121 113 117  BILITOT 2.3* 1.8* 1.8* 2.0*  PROT 6.2* 5.1* 4.4* 5.0*  ALBUMIN  3.5 3.0* 2.6* 2.8*   No results for input(s): LIPASE, AMYLASE in the last 168 hours. Recent Labs  Lab 10/08/23 1818  AMMONIA 117*   Coagulation Profile: Recent Labs  Lab 10/08/23 1818  INR 1.2   Cardiac Enzymes: No results for input(s): CKTOTAL, CKMB, CKMBINDEX, TROPONINI in the last 168 hours. BNP (last 3 results) No results for input(s): PROBNP in the last 8760 hours. HbA1C: No results for input(s): HGBA1C in the last 72 hours. CBG: Recent Labs  Lab 10/08/23 1815  GLUCAP 90   Lipid Profile: No results for input(s): CHOL, HDL, LDLCALC, TRIG, CHOLHDL, LDLDIRECT in the last 72 hours. Thyroid  Function Tests: No results for input(s): TSH, T4TOTAL, FREET4, T3FREE, THYROIDAB in the last 72 hours. Anemia Panel: No results for input(s): VITAMINB12, FOLATE, FERRITIN, TIBC, IRON , RETICCTPCT in the last 72 hours. Sepsis Labs: No results for input(s): PROCALCITON, LATICACIDVEN in the last 168 hours.  No results found for this or any previous visit (from the past 240 hours).       Radiology Studies: No results found.      Scheduled Meds:  furosemide   80 mg Oral q AM   heparin   5,000 Units Subcutaneous  Q8H   lactulose   30 g Oral TID   levothyroxine   175 mcg Oral Q0600   lidocaine   1 patch Transdermal Daily   midodrine   10 mg Oral TID WC   multivitamin with minerals  1 tablet Oral Daily   rifaximin   550 mg Oral BID   senna-docusate  1 tablet Oral BID   spironolactone   50 mg Oral Daily   venlafaxine  XR  75 mg Oral Q breakfast   Continuous Infusions:   LOS: 3 days    Time spent: 35 Minutes    Cheskel Silverio A Lalitha Ilyas, MD [facility]   If 7PM-7AM, please contact night-coverage [email or web address]  10/11/2023, 8:22 AM

## 2023-10-11 NOTE — Plan of Care (Signed)

## 2023-10-11 NOTE — TOC Progression Note (Addendum)
 Transition of Care [facility]) - Progression Note    Patient Details  Name: Idara Woodside MRN: [id number] Date of Birth: 1955-05-05  Transition of Care [facility] CM/SW Contact  Doneta Glenys DASEN, RN Phone Number: 10/11/2023, 2:44 PM  Clinical Narrative:    Patient and patients son chose [facility] PT/OT/nurse/aide.  3:11 PM [facility] would not accept patient. [facility] accepted patient.  Expected Discharge Plan: Home w Home Health Services Barriers to Discharge: Continued Medical Work up               Expected Discharge Plan and Services In-house Referral: Clinical Social Work Discharge Planning Services: NA Post Acute Care Choice: Home Health Living arrangements for the past 2 months: Single Family Home                 DME Arranged: N/A DME Agency: NA         HH Agency: NA         Social Drivers of Health (SDOH) Interventions SDOH Screenings   Food Insecurity: No Food Insecurity (10/09/2023)  Housing: Low Risk  (10/09/2023)  Transportation Needs: No Transportation Needs (10/09/2023)  Utilities: Not At Risk (10/09/2023)  Alcohol Screen: Low Risk  (09/20/2023)  Depression (PHQ2-9): Medium Risk (09/20/2023)  Financial Resource Strain: Low Risk  (09/20/2023)  Physical Activity: Inactive (09/20/2023)  Social Connections: Unknown (10/09/2023)  Stress: No Stress Concern Present (09/20/2023)  Tobacco Use: Medium Risk (09/20/2023)  Health Literacy: Adequate Health Literacy (09/20/2023)    Readmission Risk Interventions    10/10/2023    2:52 PM 08/25/2023   10:04 AM 08/09/2023   12:37 PM  Readmission Risk Prevention Plan  Transportation Screening Complete Complete   Medication Review Oceanographer) Complete Complete Complete  PCP or Specialist appointment within 3-5 days of discharge Complete  Complete  HRI or Home Care Consult Complete Complete Complete  SW Recovery Care/Counseling Consult Complete Complete Complete  Palliative Care Screening Not Applicable Not  Applicable Not Applicable  Skilled Nursing Facility Not Applicable Not Applicable Not Applicable

## 2023-10-11 NOTE — Telephone Encounter (Signed)
 1 RX(s) pended

## 2023-10-11 NOTE — Progress Notes (Signed)
 Physical Therapy Treatment Patient Details Name: [name] MRN: [id number] DOB: [birth year] Today's Date: 10/11/2023   History of Present Illness Patient is a 68 yo female presenting to the ED with AMS on 10/08/23. Admitted with same diagnosis, with noted elevated ammonia levels.  PMH includes decompensated cirrhosis, iron  deficiency anemia, anemia of chronic disease, cirrhosis, blind in L eye.    PT Comments  Pt tolerated increased ambulation distance of 81' with RW, with intermittent min assist to navigate obstacles with RW, no loss of balance. Pt performed seated BUE/LE exercises for strengthening. Good progress with activity tolerance today.     If plan is discharge home, recommend the following: A little help with walking and/or transfers;A little help with bathing/dressing/bathroom;Assistance with cooking/housework;Help with stairs or ramp for entrance   Can travel by private vehicle        Equipment Recommendations  None recommended by PT    Recommendations for Other Services       Precautions / Restrictions Precautions Precautions: Fall Recall of Precautions/Restrictions: Intact Restrictions Weight Bearing Restrictions Per Provider Order: No     Mobility  Bed Mobility Overal bed mobility: Modified Independent Bed Mobility: Supine to Sit     Supine to sit: Modified independent (Device/Increase time), HOB elevated, Used rails          Transfers Overall transfer level: Needs assistance Equipment used: Rolling walker (2 wheels) Transfers: Sit to/from Stand Sit to Stand: Contact guard assist           General transfer comment: VCs for safe hand placement    Ambulation/Gait Ambulation/Gait assistance: Contact guard assist, Min assist Gait Distance (Feet): 80 Feet Assistive device: Rolling walker (2 wheels) Gait Pattern/deviations: Step-to pattern, Decreased stride length Gait velocity: decreased     General Gait Details: intermittent light min A to  negotiate obstacles with RW, no loss of balance   Stairs             Wheelchair Mobility     Tilt Bed    Modified Rankin (Stroke Patients Only)       Balance Overall balance assessment: Needs assistance Sitting-balance support: Feet supported Sitting balance-Leahy Scale: Fair     Standing balance support: Bilateral upper extremity supported, During functional activity, Reliant on assistive device for balance Standing balance-Leahy Scale: Poor Standing balance comment: reliant on RW                            Communication Communication Communication: No apparent difficulties  Cognition Arousal: Alert Behavior During Therapy: WFL for tasks assessed/performed   PT - Cognitive impairments: No apparent impairments                         Following commands: Intact Following commands impaired: Only follows one step commands consistently    Cueing Cueing Techniques: Verbal cues  Exercises General Exercises - Upper Extremity Shoulder Flexion: AROM, Both, 10 reps, Seated General Exercises - Lower Extremity Long Arc Quad: AROM, 10 reps, Seated, Both Hip Flexion/Marching: AROM, Both, 10 reps, Seated    General Comments        Pertinent Vitals/Pain Pain Assessment Faces Pain Scale: Hurts little more Pain Location: neck Pain Descriptors / Indicators: Aching Pain Intervention(s): Limited activity within patient's tolerance, Monitored during session, Repositioned    Home Living  Prior Function            PT Goals (current goals can now be found in the care plan section) Acute Rehab PT Goals Patient Stated Goal: return home PT Goal Formulation: With patient/family Time For Goal Achievement: 10/23/23 Potential to Achieve Goals: Good Progress towards PT goals: Progressing toward goals    Frequency    Min 3X/week      PT Plan      Co-evaluation              AM-PAC PT 6 Clicks  Mobility   Outcome Measure  Help needed turning from your back to your side while in a flat bed without using bedrails?: None Help needed moving from lying on your back to sitting on the side of a flat bed without using bedrails?: A Little Help needed moving to and from a bed to a chair (including a wheelchair)?: None Help needed standing up from a chair using your arms (e.g., wheelchair or bedside chair)?: None Help needed to walk in hospital room?: A Little Help needed climbing 3-5 steps with a railing? : A Little 6 Click Score: 21    End of Session Equipment Utilized During Treatment: Gait belt Activity Tolerance: Patient tolerated treatment well Patient left: in chair;with chair alarm set;with family/visitor present;with call bell/phone within reach Nurse Communication: Mobility status PT Visit Diagnosis: Muscle weakness (generalized) (M62.81);Other abnormalities of gait and mobility (R26.89)     Time: 8681-8664 PT Time Calculation (min) (ACUTE ONLY): 17 min  Charges:    $Gait Training: 8-22 mins PT General Charges $$ ACUTE PT VISIT: 1 Visit                     Sylvan Delon Copp PT 10/11/2023  Acute Rehabilitation Services  Office [phone]

## 2023-10-11 NOTE — Telephone Encounter (Signed)
 E-Prescribed (I reviewed the controlled substance database)

## 2023-10-12 ENCOUNTER — Encounter: Payer: Self-pay | Admitting: *Deleted

## 2023-10-12 ENCOUNTER — Telehealth: Payer: Medicare (Managed Care) | Admitting: *Deleted

## 2023-10-12 DIAGNOSIS — K7682 Hepatic encephalopathy: Secondary | ICD-10-CM | POA: Diagnosis not present

## 2023-10-12 LAB — COMPREHENSIVE METABOLIC PANEL WITH GFR
ALT: 35 U/L (ref 0–44)
AST: 50 U/L — ABNORMAL HIGH (ref 15–41)
Albumin: 2.7 g/dL — ABNORMAL LOW (ref 3.5–5.0)
Alkaline Phosphatase: 114 U/L (ref 38–126)
Anion gap: 9 (ref 5–15)
BUN: 25 mg/dL — ABNORMAL HIGH (ref 8–23)
CO2: 22 mmol/L (ref 22–32)
Calcium: 8.8 mg/dL — ABNORMAL LOW (ref 8.9–10.3)
Chloride: 111 mmol/L (ref 98–111)
Creatinine, Ser: 0.89 mg/dL (ref 0.44–1.00)
GFR, Estimated: >60 mL/min (ref >60)
Glucose, Bld: 108 mg/dL — ABNORMAL HIGH (ref 70–99)
Potassium: 3.6 mmol/L (ref 3.5–5.1)
Sodium: 142 mmol/L (ref 135–145)
Total Bilirubin: 1.6 mg/dL — ABNORMAL HIGH (ref 0.0–1.2)
Total Protein: 4.7 g/dL — ABNORMAL LOW (ref 6.5–8.1)

## 2023-10-12 LAB — AMMONIA: Ammonia: 100 umol/L — ABNORMAL HIGH (ref 9–35)

## 2023-10-12 MED ORDER — METHOCARBAMOL 500 MG PO TABS
500.0000 mg | ORAL_TABLET | Freq: Once | ORAL | Status: AC
  Administered 2023-10-12: 500 mg via ORAL
  Filled 2023-10-12: qty 1

## 2023-10-12 MED ORDER — MEGESTROL ACETATE 40 MG PO TABS
40.0000 mg | ORAL_TABLET | Freq: Two times a day (BID) | ORAL | Status: DC
  Administered 2023-10-12 – 2023-10-23 (×23): 40 mg via ORAL
  Filled 2023-10-12 (×23): qty 1

## 2023-10-12 NOTE — Progress Notes (Signed)
 PROGRESS NOTE    [name]  FMW:[id number] DOB: 03/13/1955 DOA: 10/08/2023 PCP: [name], MD   Brief Narrative: 68 year old with past medical history significant for [name] with encephalopathy, short gut Syndrome with chronic diarrhea, stage IIIa CKD with anemia, hypothyroidism, anxiety/depression, chronic hypotension and hyperlipidemia who presented 9/28 with altered mental status, elevated NIH 4 treated with lactulose  for hepatic encephalopathy.   Assessment & Plan:   Principal Problem:   AMS (altered mental status)  1-Acute hepatic encephalopathy: - Patient presented with confusion and hyperammonemia ammonia level at 117.  With known history of cryptogenic NASH cirrhosis. Continue rifaximin  Continue lactulose  30 mg 3 times daily previously twice daily Improved, more alert today,. Would continue with current lactulose  dose.  Ammonia: 117---100 She had 5 BM yesterday.  She only got lactulose  once.   Cirrhosis, NASH Continue with spironolactone  furosemide  and propranolol INR 1.2 bilirubin 1.6  AKI Received IV fluids in the ED, creatinine peaked to 1.1 Monitor on diuretics Stable.   None anion gap metabolic acidosis: Monitor tx underlying conditions  Chronic hypotension Blood pressure has been elevated, plan to reduce midodrine  to 2.5  Hypothyroidism: Continue Synthroid   Hyperlipidemia: Holding statins  Chronic pain: Patient takes hydrocodone  at home. Patient and son asking for pain medication. Received oxycodone  last night. Will order low-dose oxycodone , hold for sedation, will avoid given during the day to avoid oversedation Lidocaine  patch applied to the neck  Anxiety/depression Continue velafaxine.  Generalized weakness: PT OT consulted home health to be arranged Obesity type I Need lifestyle modifications   Estimated body mass index is 37.49 kg/m as calculated from the following:   Height as of this encounter: 5' 2 (1.575 m).    Weight as of this encounter: 93 kg.   DVT prophylaxis: Will order SCDs, hold heparin  due to history of cirrhosis Code Status: Full code Family Communication: Son who was at bedside 10/01 Disposition Plan:  Status is: Inpatient Remains inpatient appropriate because: Management of acute encephalopathy    Consultants:  none  Procedures:  none  Antimicrobials:    Subjective: She is more alert today, follows command. Oriented to place and name.   Objective: Vitals:   10/11/23 1617 10/11/23 2029 10/12/23 0509 10/12/23 0637  BP: 113/73 117/66 124/71 122/72  Pulse: 89 73 71 73  Resp:  17 18 14   Temp:  98.5 F (36.9 C) 97.7 F (36.5 C) 98.7 F (37.1 C)  TempSrc:  Oral Oral Oral  SpO2:  99% 98% 99%  Weight:      Height:       No intake or output data in the 24 hours ending 10/12/23 0802  Filed Weights   10/08/23 1547  Weight: 93 kg    Examination:  General exam: NAD Respiratory system: Normal resp effort Cardiovascular system: S 1, S 2 RRR Gastrointestinal system: BS present, soft, nt Central nervous system: alert, follows command.  Extremities: no edema     Data Reviewed: I have personally reviewed following labs and imaging studies  CBC: Recent Labs  Lab 10/08/23 1818 10/09/23 0524 10/10/23 0617 10/11/23 0524  WBC 8.6 6.2 6.1 7.2  NEUTROABS 5.1  --  3.3 4.4  HGB 14.0 12.7 11.4* 12.1  HCT 45.1 41.0 36.0 39.9  MCV 97.6 98.3 99.2 102.3*  PLT 162 128* 128* 136*   Basic Metabolic Panel: Recent Labs  Lab 10/08/23 1818 10/09/23 0524 10/10/23 0617 10/11/23 0524 10/12/23 0418  NA 138 138 144 142 142  K 3.5 3.6 3.5 3.6 3.6  CL 107 109 116* 113* 111  CO2 19* 19* 19* 18* 22  GLUCOSE 105* 90 121* 102* 108*  BUN 26* 21 23 24* 25*  CREATININE 1.15* 0.89 0.82 0.92 0.89  CALCIUM  9.4 8.8* 8.6* 8.9 8.8*  MG  --  2.2  --   --   --   PHOS  --  3.1  --   --   --    GFR: Estimated Creatinine Clearance: 66.1 mL/min (by C-G formula based on SCr of 0.89  mg/dL). Liver Function Tests: Recent Labs  Lab 10/08/23 1818 10/09/23 0524 10/10/23 0617 10/11/23 0524 10/12/23 0418  AST 41 40 36 52* 50*  ALT 40 36 31 36 35  ALKPHOS 145* 121 113 117 114  BILITOT 2.3* 1.8* 1.8* 2.0* 1.6*  PROT 6.2* 5.1* 4.4* 5.0* 4.7*  ALBUMIN  3.5 3.0* 2.6* 2.8* 2.7*   No results for input(s): LIPASE, AMYLASE in the last 168 hours. Recent Labs  Lab 10/08/23 1818 10/12/23 0418  AMMONIA 117* 100*   Coagulation Profile: Recent Labs  Lab 10/08/23 1818  INR 1.2   Cardiac Enzymes: No results for input(s): CKTOTAL, CKMB, CKMBINDEX, TROPONINI in the last 168 hours. BNP (last 3 results) No results for input(s): PROBNP in the last 8760 hours. HbA1C: No results for input(s): HGBA1C in the last 72 hours. CBG: Recent Labs  Lab 10/08/23 1815  GLUCAP 90   Lipid Profile: No results for input(s): CHOL, HDL, LDLCALC, TRIG, CHOLHDL, LDLDIRECT in the last 72 hours. Thyroid  Function Tests: No results for input(s): TSH, T4TOTAL, FREET4, T3FREE, THYROIDAB in the last 72 hours. Anemia Panel: No results for input(s): VITAMINB12, FOLATE, FERRITIN, TIBC, IRON , RETICCTPCT in the last 72 hours. Sepsis Labs: No results for input(s): PROCALCITON, LATICACIDVEN in the last 168 hours.  No results found for this or any previous visit (from the past 240 hours).       Radiology Studies: No results found.      Scheduled Meds:  furosemide   80 mg Oral q AM   lactulose   30 g Oral TID   levothyroxine   175 mcg Oral Q0600   lidocaine   1 patch Transdermal Daily   lidocaine   1 patch Transdermal Q24H   midodrine   2.5 mg Oral TID WC   multivitamin with minerals  1 tablet Oral Daily   rifaximin   550 mg Oral BID   senna-docusate  1 tablet Oral BID   spironolactone   50 mg Oral Daily   venlafaxine  XR  75 mg Oral Q breakfast   Continuous Infusions:   LOS: 4 days    Time spent: 35 Minutes    Lemond Griffee A Clanton Emanuelson,  MD [facility]   If 7PM-7AM, please contact night-coverage [email or web address]  10/12/2023, 8:02 AM

## 2023-10-12 NOTE — Progress Notes (Signed)
 Mobility Specialist - Progress Note   10/12/23 1130  Mobility  Activity Ambulated with assistance  Level of Assistance Contact guard assist, steadying assist  Assistive Device Front wheel walker  Distance Ambulated (ft) 120 ft  Range of Motion/Exercises Active  Activity Response Tolerated well  Mobility Referral Yes  Mobility visit 1 Mobility  Mobility Specialist Start Time (ACUTE ONLY) 1130  Mobility Specialist Stop Time (ACUTE ONLY) 1201  Mobility Specialist Time Calculation (min) (ACUTE ONLY) 31 min   Received in [facility] and agreed to mobility. No issues throughout session and returned to bed with all needs met.  Cyndee Ada Mobility Specialist

## 2023-10-12 NOTE — Plan of Care (Signed)

## 2023-10-12 NOTE — Patient Instructions (Signed)
 [name] - I am sorry I was unable to reach you today for our scheduled appointment as you were hospitalized. I spoke with our son. I work with [name], MD and am calling to support your healthcare needs. You have been rescheduled for 11/20/23 at 1000 am with Rosaline Finlay, RN CM. She Will call you around this time. We look forward to speaking with you soon.   Thank you,  Suzen L. Ramonita, RN, BSN, CCM [place]  [facility] RN Care Manager

## 2023-10-13 ENCOUNTER — Encounter (HOSPITAL_COMMUNITY): Payer: Self-pay | Admitting: Internal Medicine

## 2023-10-13 ENCOUNTER — Other Ambulatory Visit: Payer: Self-pay

## 2023-10-13 DIAGNOSIS — R4182 Altered mental status, unspecified: Secondary | ICD-10-CM | POA: Diagnosis not present

## 2023-10-13 MED ORDER — VITAMIN D (ERGOCALCIFEROL) 1.25 MG (50000 UNIT) PO CAPS
50000.0000 [IU] | ORAL_CAPSULE | ORAL | Status: DC
  Administered 2023-10-18: 50000 [IU] via ORAL
  Filled 2023-10-13: qty 1

## 2023-10-13 MED ORDER — ROPINIROLE HCL 0.25 MG PO TABS
0.2500 mg | ORAL_TABLET | Freq: Every day | ORAL | Status: DC
  Administered 2023-10-13 – 2023-10-22 (×10): 0.25 mg via ORAL
  Filled 2023-10-13 (×9): qty 1

## 2023-10-13 MED ORDER — PANTOPRAZOLE SODIUM 40 MG PO TBEC
40.0000 mg | DELAYED_RELEASE_TABLET | Freq: Two times a day (BID) | ORAL | Status: DC
Start: 2023-10-13 — End: 2023-10-23
  Administered 2023-10-13 – 2023-10-23 (×20): 40 mg via ORAL
  Filled 2023-10-13 (×20): qty 1

## 2023-10-13 NOTE — Plan of Care (Signed)

## 2023-10-13 NOTE — Progress Notes (Signed)
 PROGRESS NOTE    [name]  FMW:[id number] DOB: 1955/01/19 DOA: 10/08/2023 PCP: [name], MD   Brief Narrative:  This 68 year old female with past medical history significant for [name] with encephalopathy, short gut Syndrome with chronic diarrhea, stage IIIa CKD with anemia, hypothyroidism, anxiety/depression, chronic hypotension and hyperlipidemia who presented on 9/28 with altered mental status, elevated NIH 4 treated with lactulose  for hepatic encephalopathy.   Assessment & Plan:   Principal Problem:   AMS (altered mental status)   Acute hepatic encephalopathy: Patient presented with confusion and hyperammonemia,  ammonia level at 117.   Patient with history of cryptogenic NASH cirrhosis. Continue rifaximin  Continue lactulose  30 mg 3 times daily previously twice daily. Mental status Improved, more alert today, Would continue with current lactulose  dose.  Ammonia: 117---100 Patient has few bowel movements today.  Cirrhosis, NASH Continue with spironolactone ,  furosemide  and propranolol. INR 1.2 bilirubin 1.6   AKI: Received IV fluids in the ED, Creatinine peaked to 1.1 Monitor on diuretics.  AKI > Resolved.   Non anion gap metabolic acidosis: tx underlying conditions. Now resolved.   Chronic hypotension Blood pressure has been elevated, plan to reduce midodrine  to 2.5.   Hypothyroidism: Continue Synthroid .   Hyperlipidemia: Holding statins.   Chronic pain: Patient takes hydrocodone  at home. Patient and son asking for pain medication. Received oxycodone  last night. Will order low-dose oxycodone , hold for sedation, will avoid given during the day to avoid oversedation Lidocaine  patch applied to the neck   Anxiety/depression Continue velafaxine.  Generalized weakness: PT OT consulted home health to be arranged.  Obesity type I Need lifestyle modifications     Estimated body mass index is 37.49 kg/m as calculated from the following:    Height as of this encounter: 5' 2 (1.575 m).   Weight as of this encounter: 93 kg.   DVT prophylaxis:SCDs Code Status: Full code Family Communication: No family at bed side Disposition Plan:    Status is: Inpatient Remains inpatient appropriate because: severity of illness.    Consultants:  None  Procedures: None  Antimicrobials:  Anti-infectives (From admission, onward)    Start     Dose/Rate Route Frequency Ordered Stop   10/08/23 2200  rifaximin  (XIFAXAN ) tablet 550 mg        550 mg Oral 2 times daily 10/08/23 1953         Subjective: Patient was seen and examined at bedside.  Overnight events noted. Patient seems much alert,  oriented and following commands. Her ammonia levels are still elevated above 100.  Objective: Vitals:   10/12/23 2117 10/13/23 0603 10/13/23 1236 10/13/23 1430  BP: 130/63 123/79 128/78 135/77  Pulse: 64 98 91   Resp: 18     Temp: 98.2 F (36.8 C)  98.8 F (37.1 C)   TempSrc: Oral     SpO2: 99%  96%   Weight:      Height:       No intake or output data in the 24 hours ending 10/13/23 1457 Filed Weights   10/08/23 1547  Weight: 93 kg    Examination:  General exam: Appears calm and comfortable, not in any acute distress. Respiratory system: CTA Bilaterally . Respiratory effort normal. RR 16 Cardiovascular system: S1 & S2 heard, RRR. No JVD, murmurs, rubs, gallops or clicks. No pedal edema. Gastrointestinal system: Abdomen is non distended, soft and non tender. Normal bowel sounds heard. Central nervous system: Alert and oriented x 3. No focal neurological deficits. Extremities: No edema, no  cyanosis, no clubbing. Skin: No rashes, lesions or ulcers Psychiatry: Judgement and insight appear normal. Mood & affect appropriate.     Data Reviewed: I have personally reviewed following labs and imaging studies  CBC: Recent Labs  Lab 10/08/23 1818 10/09/23 0524 10/10/23 0617 10/11/23 0524  WBC 8.6 6.2 6.1 7.2  NEUTROABS 5.1  --   3.3 4.4  HGB 14.0 12.7 11.4* 12.1  HCT 45.1 41.0 36.0 39.9  MCV 97.6 98.3 99.2 102.3*  PLT 162 128* 128* 136*   Basic Metabolic Panel: Recent Labs  Lab 10/08/23 1818 10/09/23 0524 10/10/23 0617 10/11/23 0524 10/12/23 0418  NA 138 138 144 142 142  K 3.5 3.6 3.5 3.6 3.6  CL 107 109 116* 113* 111  CO2 19* 19* 19* 18* 22  GLUCOSE 105* 90 121* 102* 108*  BUN 26* 21 23 24* 25*  CREATININE 1.15* 0.89 0.82 0.92 0.89  CALCIUM  9.4 8.8* 8.6* 8.9 8.8*  MG  --  2.2  --   --   --   PHOS  --  3.1  --   --   --    GFR: Estimated Creatinine Clearance: 66.1 mL/min (by C-G formula based on SCr of 0.89 mg/dL). Liver Function Tests: Recent Labs  Lab 10/08/23 1818 10/09/23 0524 10/10/23 0617 10/11/23 0524 10/12/23 0418  AST 41 40 36 52* 50*  ALT 40 36 31 36 35  ALKPHOS 145* 121 113 117 114  BILITOT 2.3* 1.8* 1.8* 2.0* 1.6*  PROT 6.2* 5.1* 4.4* 5.0* 4.7*  ALBUMIN  3.5 3.0* 2.6* 2.8* 2.7*   No results for input(s): LIPASE, AMYLASE in the last 168 hours. Recent Labs  Lab 10/08/23 1818 10/12/23 0418  AMMONIA 117* 100*   Coagulation Profile: Recent Labs  Lab 10/08/23 1818  INR 1.2   Cardiac Enzymes: No results for input(s): CKTOTAL, CKMB, CKMBINDEX, TROPONINI in the last 168 hours. BNP (last 3 results) No results for input(s): PROBNP in the last 8760 hours. HbA1C: No results for input(s): HGBA1C in the last 72 hours. CBG: Recent Labs  Lab 10/08/23 1815  GLUCAP 90   Lipid Profile: No results for input(s): CHOL, HDL, LDLCALC, TRIG, CHOLHDL, LDLDIRECT in the last 72 hours. Thyroid  Function Tests: No results for input(s): TSH, T4TOTAL, FREET4, T3FREE, THYROIDAB in the last 72 hours. Anemia Panel: No results for input(s): VITAMINB12, FOLATE, FERRITIN, TIBC, IRON , RETICCTPCT in the last 72 hours. Sepsis Labs: No results for input(s): PROCALCITON, LATICACIDVEN in the last 168 hours.  No results found for this or any  previous visit (from the past 240 hours).   Radiology Studies: No results found.  Scheduled Meds:  furosemide   80 mg Oral q AM   lactulose   30 g Oral TID   levothyroxine   175 mcg Oral Q0600   lidocaine   1 patch Transdermal Daily   lidocaine   1 patch Transdermal Q24H   megestrol   40 mg Oral BID   midodrine   2.5 mg Oral TID WC   multivitamin with minerals  1 tablet Oral Daily   pantoprazole   40 mg Oral BID   rifaximin   550 mg Oral BID   rOPINIRole   0.25 mg Oral QHS   senna-docusate  1 tablet Oral BID   spironolactone   50 mg Oral Daily   venlafaxine  XR  75 mg Oral Q breakfast   [START ON 10/18/2023] Vitamin D  (Ergocalciferol )  50,000 Units Oral Q Wed   Continuous Infusions:   LOS: 5 days    Time spent: 50 Mins.  Darcel Dawley, MD [facility]   If 7PM-7AM, please contact night-coverage

## 2023-10-13 NOTE — Progress Notes (Signed)
 Mobility Specialist - Progress Note    10/13/23 1019  Mobility  Activity Ambulated with assistance  Level of Assistance Contact guard assist, steadying assist  Assistive Device Front wheel walker  Distance Ambulated (ft) 140 ft  Range of Motion/Exercises Active  Activity Response Tolerated well  Mobility Referral Yes  Mobility visit 1 Mobility  Mobility Specialist Start Time (ACUTE ONLY) [id number]  Mobility Specialist Stop Time (ACUTE ONLY) 0914  Mobility Specialist Time Calculation (min) (ACUTE ONLY) 20 min   Pt was received in bed and agreed to mobility. Went to [facility] before ambulation. Returned to bed with all needs met.  [employer] - Mobility Specialist

## 2023-10-14 DIAGNOSIS — R4182 Altered mental status, unspecified: Secondary | ICD-10-CM | POA: Diagnosis not present

## 2023-10-14 LAB — AMMONIA: Ammonia: 108 umol/L — ABNORMAL HIGH (ref 9–35)

## 2023-10-14 LAB — COMPREHENSIVE METABOLIC PANEL WITH GFR
ALT: 37 U/L (ref 0–44)
AST: 45 U/L — ABNORMAL HIGH (ref 15–41)
Albumin: 2.8 g/dL — ABNORMAL LOW (ref 3.5–5.0)
Alkaline Phosphatase: 113 U/L (ref 38–126)
Anion gap: 8 (ref 5–15)
BUN: 25 mg/dL — ABNORMAL HIGH (ref 8–23)
CO2: 20 mmol/L — ABNORMAL LOW (ref 22–32)
Calcium: 8.7 mg/dL — ABNORMAL LOW (ref 8.9–10.3)
Chloride: 110 mmol/L (ref 98–111)
Creatinine, Ser: 0.9 mg/dL (ref 0.44–1.00)
GFR, Estimated: >60 mL/min (ref >60)
Glucose, Bld: 95 mg/dL (ref 70–99)
Potassium: 3.4 mmol/L — ABNORMAL LOW (ref 3.5–5.1)
Sodium: 139 mmol/L (ref 135–145)
Total Bilirubin: 1.5 mg/dL — ABNORMAL HIGH (ref 0.0–1.2)
Total Protein: 4.8 g/dL — ABNORMAL LOW (ref 6.5–8.1)

## 2023-10-14 LAB — PHOSPHORUS: Phosphorus: 3.4 mg/dL (ref 2.5–4.6)

## 2023-10-14 LAB — CBC
HCT: 37 % (ref 36.0–46.0)
Hemoglobin: 11.5 g/dL — ABNORMAL LOW (ref 12.0–15.0)
MCH: 30.8 pg (ref 26.0–34.0)
MCHC: 31.1 g/dL (ref 30.0–36.0)
MCV: 99.2 fL (ref 80.0–100.0)
Platelets: 125 K/uL — ABNORMAL LOW (ref 150–400)
RBC: 3.73 MIL/uL — ABNORMAL LOW (ref 3.87–5.11)
RDW: 16.5 % — ABNORMAL HIGH (ref 11.5–15.5)
WBC: 6.3 K/uL (ref 4.0–10.5)
nRBC: 0 % (ref 0.0–0.2)

## 2023-10-14 LAB — MAGNESIUM: Magnesium: 1.7 mg/dL (ref 1.7–2.4)

## 2023-10-14 MED ORDER — POTASSIUM CHLORIDE 20 MEQ PO PACK
40.0000 meq | PACK | Freq: Once | ORAL | Status: AC
  Administered 2023-10-14: 40 meq via ORAL
  Filled 2023-10-14: qty 2

## 2023-10-14 MED ORDER — POTASSIUM CHLORIDE CRYS ER 20 MEQ PO TBCR
20.0000 meq | EXTENDED_RELEASE_TABLET | Freq: Two times a day (BID) | ORAL | Status: DC
  Administered 2023-10-14 – 2023-10-23 (×19): 20 meq via ORAL
  Filled 2023-10-14 (×19): qty 1

## 2023-10-14 NOTE — Plan of Care (Signed)

## 2023-10-14 NOTE — Progress Notes (Signed)
 Mobility Specialist - Progress Note   10/14/23 1152  Mobility  Activity Ambulated with assistance  Level of Assistance Contact guard assist, steadying assist  Assistive Device Front wheel walker  Distance Ambulated (ft) 80 ft  Range of Motion/Exercises Active  Activity Response Tolerated well  Mobility Referral Yes  Mobility visit 1 Mobility  Mobility Specialist Start Time (ACUTE ONLY) 1140  Mobility Specialist Stop Time (ACUTE ONLY) 1152  Mobility Specialist Time Calculation (min) (ACUTE ONLY) 12 min   Pt was found in bed and agreeable to mobilize after [facility] use. Assisted with transfer and grew fatigued with session. At EOS returned to bed with all needs met. Call bell in reach.   Erminio Leos,  Mobility Specialist Can be reached via Secure Chat

## 2023-10-14 NOTE — Progress Notes (Signed)
 PROGRESS NOTE    Trinita Devlin  FMW:[id number] DOB: [birth year] DOA: 10/08/2023 PCP: [name], MD   Brief Narrative:  This 68 year old female with past medical history significant for [name] with encephalopathy, short gut Syndrome with chronic diarrhea, stage IIIa CKD with anemia, hypothyroidism, anxiety/depression, chronic hypotension and hyperlipidemia who presented on 9/28 with altered mental status, elevated NIH 4 treated with lactulose  for hepatic encephalopathy.   Assessment & Plan:   Principal Problem:   AMS (altered mental status)  Acute hepatic encephalopathy: Patient presented with confusion and hyperammonemia,  ammonia level at 117.   Patient with history of cryptogenic NASH cirrhosis. Continue rifaximin  Continue lactulose  30 mg 3 times daily previously twice daily. Mental status Improved, more alert today, Would continue with current lactulose  dose.  Ammonia: 117---100 >108 Patient has few bowel movements today.  Cirrhosis, NASH Continue with spironolactone ,  furosemide  and propranolol. INR 1.2 bilirubin 1.6   AKI: Received IV fluids in the ED, Creatinine peaked to 1.1 Monitor on diuretics.  AKI > Resolved.   Non anion gap metabolic acidosis: tx underlying conditions. Now resolved.   Chronic hypotension: Blood pressure has been elevated, plan to reduce midodrine  to 2.5.   Hypothyroidism: Continue Synthroid .   Hyperlipidemia: Holding statins.   Chronic pain: Patient takes hydrocodone  at home. Patient and son asking for pain medication. Received oxycodone  last night. Will order low-dose oxycodone , hold for sedation, will avoid given during the day to avoid oversedation. Lidocaine  patch applied to the neck   Anxiety/depression: Continue velafaxine.  Generalized weakness: PT OT consulted home health to be arranged.  Obesity type I: Need lifestyle modifications.     Estimated body mass index is 37.49 kg/m as calculated from the  following:   Height as of this encounter: 5' 2 (1.575 m).   Weight as of this encounter: 93 kg.   DVT prophylaxis:SCDs Code Status: Full code Family Communication: Son at bedside. Disposition Plan:    Status is: Inpatient Remains inpatient appropriate because: severity of illness.    Consultants:  None  Procedures: None  Antimicrobials:  Anti-infectives (From admission, onward)    Start     Dose/Rate Route Frequency Ordered Stop   10/08/23 2200  rifaximin  (XIFAXAN ) tablet 550 mg        550 mg Oral 2 times daily 10/08/23 1953         Subjective: Patient was seen and examined at bedside.Overnight events noted. Patient seems much alert,  oriented and following commands. Her ammonia levels are still elevated above 108.  Objective: Vitals:   10/13/23 1430 10/13/23 2039 10/14/23 0549 10/14/23 1221  BP: 135/77 134/84 128/62 (!) 116/57  Pulse:  77 76 79  Resp:  16 19 18   Temp:  99 F (37.2 C) 98.8 F (37.1 C) 98.7 F (37.1 C)  TempSrc:  Oral Oral   SpO2:  100% 100% 100%  Weight:      Height:       No intake or output data in the 24 hours ending 10/14/23 1326 Filed Weights   10/08/23 1547  Weight: 93 kg    Examination:  General exam: Appears calm and comfortable, not in any acute distress. Respiratory system: CTA Bilaterally . Respiratory effort normal. RR 14 Cardiovascular system: S1 & S2 heard, RRR. No JVD, murmurs, rubs, gallops or clicks. No pedal edema. Gastrointestinal system: Abdomen is non distended, soft and non tender. Normal bowel sounds heard. Central nervous system: Alert and oriented x 2. No focal neurological deficits. Extremities: No edema, no  cyanosis, no clubbing. Skin: No rashes, lesions or ulcers Psychiatry: Judgement and insight appear normal. Mood & affect appropriate.     Data Reviewed: I have personally reviewed following labs and imaging studies  CBC: Recent Labs  Lab 10/08/23 1818 10/09/23 0524 10/10/23 0617 10/11/23 0524  10/14/23 0501  WBC 8.6 6.2 6.1 7.2 6.3  NEUTROABS 5.1  --  3.3 4.4  --   HGB 14.0 12.7 11.4* 12.1 11.5*  HCT 45.1 41.0 36.0 39.9 37.0  MCV 97.6 98.3 99.2 102.3* 99.2  PLT 162 128* 128* 136* 125*   Basic Metabolic Panel: Recent Labs  Lab 10/09/23 0524 10/10/23 0617 10/11/23 0524 10/12/23 0418 10/14/23 0501  NA 138 144 142 142 139  K 3.6 3.5 3.6 3.6 3.4*  CL 109 116* 113* 111 110  CO2 19* 19* 18* 22 20*  GLUCOSE 90 121* 102* 108* 95  BUN 21 23 24* 25* 25*  CREATININE 0.89 0.82 0.92 0.89 0.90  CALCIUM  8.8* 8.6* 8.9 8.8* 8.7*  MG 2.2  --   --   --  1.7  PHOS 3.1  --   --   --  3.4   GFR: Estimated Creatinine Clearance: 65.3 mL/min (by C-G formula based on SCr of 0.9 mg/dL). Liver Function Tests: Recent Labs  Lab 10/09/23 0524 10/10/23 0617 10/11/23 0524 10/12/23 0418 10/14/23 0501  AST 40 36 52* 50* 45*  ALT 36 31 36 35 37  ALKPHOS 121 113 117 114 113  BILITOT 1.8* 1.8* 2.0* 1.6* 1.5*  PROT 5.1* 4.4* 5.0* 4.7* 4.8*  ALBUMIN  3.0* 2.6* 2.8* 2.7* 2.8*   No results for input(s): LIPASE, AMYLASE in the last 168 hours. Recent Labs  Lab 10/08/23 1818 10/12/23 0418 10/14/23 0501  AMMONIA 117* 100* 108*   Coagulation Profile: Recent Labs  Lab 10/08/23 1818  INR 1.2   Cardiac Enzymes: No results for input(s): CKTOTAL, CKMB, CKMBINDEX, TROPONINI in the last 168 hours. BNP (last 3 results) No results for input(s): PROBNP in the last 8760 hours. HbA1C: No results for input(s): HGBA1C in the last 72 hours. CBG: Recent Labs  Lab 10/08/23 1815  GLUCAP 90   Lipid Profile: No results for input(s): CHOL, HDL, LDLCALC, TRIG, CHOLHDL, LDLDIRECT in the last 72 hours. Thyroid  Function Tests: No results for input(s): TSH, T4TOTAL, FREET4, T3FREE, THYROIDAB in the last 72 hours. Anemia Panel: No results for input(s): VITAMINB12, FOLATE, FERRITIN, TIBC, IRON , RETICCTPCT in the last 72 hours. Sepsis Labs: No results for  input(s): PROCALCITON, LATICACIDVEN in the last 168 hours.  No results found for this or any previous visit (from the past 240 hours).   Radiology Studies: No results found.  Scheduled Meds:  furosemide   80 mg Oral q AM   lactulose   30 g Oral TID   levothyroxine   175 mcg Oral Q0600   lidocaine   1 patch Transdermal Daily   lidocaine   1 patch Transdermal Q24H   megestrol   40 mg Oral BID   midodrine   2.5 mg Oral TID WC   multivitamin with minerals  1 tablet Oral Daily   pantoprazole   40 mg Oral BID   potassium chloride   20 mEq Oral BID   rifaximin   550 mg Oral BID   rOPINIRole   0.25 mg Oral QHS   senna-docusate  1 tablet Oral BID   spironolactone   50 mg Oral Daily   venlafaxine  XR  75 mg Oral Q breakfast   [START ON 10/18/2023] Vitamin D  (Ergocalciferol )  50,000 Units Oral Q Wed  Continuous Infusions:   LOS: 6 days    Time spent: 35 Mins.    Darcel Dawley, MD [facility]   If 7PM-7AM, please contact night-coverage

## 2023-10-15 DIAGNOSIS — R4182 Altered mental status, unspecified: Secondary | ICD-10-CM | POA: Diagnosis not present

## 2023-10-15 LAB — COMPREHENSIVE METABOLIC PANEL WITH GFR
ALT: 36 U/L (ref 0–44)
AST: 38 U/L (ref 15–41)
Albumin: 2.8 g/dL — ABNORMAL LOW (ref 3.5–5.0)
Alkaline Phosphatase: 116 U/L (ref 38–126)
Anion gap: 9 (ref 5–15)
BUN: 27 mg/dL — ABNORMAL HIGH (ref 8–23)
CO2: 22 mmol/L (ref 22–32)
Calcium: 9 mg/dL (ref 8.9–10.3)
Chloride: 109 mmol/L (ref 98–111)
Creatinine, Ser: 1.01 mg/dL — ABNORMAL HIGH (ref 0.44–1.00)
GFR, Estimated: >60 mL/min (ref >60)
Glucose, Bld: 104 mg/dL — ABNORMAL HIGH (ref 70–99)
Potassium: 4.1 mmol/L (ref 3.5–5.1)
Sodium: 140 mmol/L (ref 135–145)
Total Bilirubin: 1.5 mg/dL — ABNORMAL HIGH (ref 0.0–1.2)
Total Protein: 4.8 g/dL — ABNORMAL LOW (ref 6.5–8.1)

## 2023-10-15 LAB — CBC
HCT: 39 % (ref 36.0–46.0)
Hemoglobin: 11.8 g/dL — ABNORMAL LOW (ref 12.0–15.0)
MCH: 30.3 pg (ref 26.0–34.0)
MCHC: 30.3 g/dL (ref 30.0–36.0)
MCV: 100.3 fL — ABNORMAL HIGH (ref 80.0–100.0)
Platelets: 118 K/uL — ABNORMAL LOW (ref 150–400)
RBC: 3.89 MIL/uL (ref 3.87–5.11)
RDW: 16.7 % — ABNORMAL HIGH (ref 11.5–15.5)
WBC: 5.7 K/uL (ref 4.0–10.5)
nRBC: 0 % (ref 0.0–0.2)

## 2023-10-15 LAB — AMMONIA: Ammonia: 149 umol/L — ABNORMAL HIGH (ref 9–35)

## 2023-10-15 LAB — PHOSPHORUS: Phosphorus: 3.5 mg/dL (ref 2.5–4.6)

## 2023-10-15 LAB — MAGNESIUM: Magnesium: 1.8 mg/dL (ref 1.7–2.4)

## 2023-10-15 NOTE — Plan of Care (Signed)

## 2023-10-15 NOTE — Progress Notes (Signed)
 Occupational Therapy Treatment Patient Details Name: [name] MRN: [id number] DOB: 1955/10/18 Today's Date: 10/15/2023   History of present illness Patient is a 68 yo female presenting to the ED with AMS on 10/08/23. Admitted with same diagnosis, with noted elevated ammonia levels.  PMH includes decompensated cirrhosis, iron  deficiency anemia, anemia of chronic disease, cirrhosis, blind in L eye.   OT comments  Pt agreeable to get to chair.  Pt stated she had neck pain and back pain - encouraged pt a change of position.  Pt did transfer to the [facility] then to chair      If plan is discharge home, recommend the following:  A little help with walking and/or transfers;A little help with bathing/dressing/bathroom;Assistance with cooking/housework;Direct supervision/assist for medications management;Direct supervision/assist for financial management;Assist for transportation;Help with stairs or ramp for entrance;Supervision due to cognitive status   Equipment Recommendations  None recommended by OT       Precautions / Restrictions Precautions Precautions: Fall       Mobility Bed Mobility        Pt EOB            Transfers Overall transfer level: Needs assistance Equipment used: 1 person hand held assist Transfers: Sit to/from Stand Sit to Stand: Min assist                 Balance Overall balance assessment: Needs assistance Sitting-balance support: Feet supported Sitting balance-Leahy Scale: Fair     Standing balance support: Bilateral upper extremity supported, During functional activity, Reliant on assistive device for balance Standing balance-Leahy Scale: Poor Standing balance comment: \                           ADL either performed or assessed with clinical judgement   ADL Overall ADL's : Needs assistance/impaired                         Toilet Transfer: Minimal assistance;Ambulation;Rolling walker (2 wheels)   Toileting- Clothing  Manipulation and Hygiene: Sitting/lateral lean;Sit to/from stand;Minimal assistance              Extremity/Trunk Assessment Upper Extremity Assessment Upper Extremity Assessment: Right hand dominant;Generalized weakness       Cervical / Trunk Assessment Cervical / Trunk Assessment: Kyphotic    Vision Baseline Vision/History: 2 Legally blind               Cognition Arousal: Alert Behavior During Therapy: WFL for tasks assessed/performed Cognition: History of cognitive impairments                                                      Pertinent Vitals/ Pain       Pain Assessment Pain Score: 3  Pain Location: neck and back Pain Descriptors / Indicators: Aching Pain Intervention(s): Limited activity within patient's tolerance, Repositioned   Frequency  Min 2X/week        Progress Toward Goals  OT Goals(current goals can now be found in the care plan section)     Acute Rehab OT Goals OT Goal Formulation: With patient/family Time For Goal Achievement: 10/23/23 Potential to Achieve Goals: Good  Plan         AM-PAC OT 6 Clicks Daily Activity     Outcome Measure  Help from another person eating meals?: A Little Help from another person taking care of personal grooming?: A Little Help from another person toileting, which includes using toliet, bedpan, or urinal?: A Little Help from another person bathing (including washing, rinsing, drying)?: A Little Help from another person to put on and taking off regular upper body clothing?: A Little Help from another person to put on and taking off regular lower body clothing?: A Little 6 Click Score: 18    End of Session Equipment Utilized During Treatment: Gait belt  OT Visit Diagnosis: Unsteadiness on feet (R26.81);Other abnormalities of gait and mobility (R26.89);Muscle weakness (generalized) (M62.81);History of falling (Z91.81);Other symptoms and signs involving cognitive function    Activity Tolerance Patient limited by fatigue   Patient Left in chair;with family/visitor present   Nurse Communication Mobility status        Time: 8956-8944 OT Time Calculation (min): 12 min  Charges: OT Treatments $Self Care/Home Management : 8-22 mins   Marianita Botkin, Norvel BIRCH 10/15/2023, 12:28 PM

## 2023-10-15 NOTE — Progress Notes (Signed)
 PROGRESS NOTE    [name]  FMW:[id number] DOB: 08-30-1955 DOA: 10/08/2023 PCP: [name], MD   Brief Narrative:  This 68 year old female with past medical history significant for [name] with encephalopathy, short gut Syndrome with chronic diarrhea, stage IIIa CKD with anemia, hypothyroidism, anxiety/depression, chronic hypotension and hyperlipidemia who presented on 9/28 with altered mental status, elevated NIH 4 treated with lactulose  for hepatic encephalopathy.   Assessment & Plan:   Principal Problem:   AMS (altered mental status)  Acute hepatic encephalopathy: Patient presented with confusion and hyperammonemia,  ammonia level at 117.   Patient with history of cryptogenic NASH cirrhosis. Continue rifaximin  Continue lactulose  30 mg 3 times daily previously twice daily. Mental status Improved, more alert today, Would continue with current lactulose  dose.  Ammonia: 117---100 >108>149   Cirrhosis, NASH Continue with spironolactone ,  furosemide  and propranolol. INR 1.2 bilirubin 1.6   AKI: Received IV fluids in the ED, Creatinine peaked to 1.1 Monitor on diuretics.  AKI > Resolved.   Non anion gap metabolic acidosis: tx underlying conditions. Now resolved.   Chronic hypotension: Blood pressure has been elevated, plan to reduce midodrine  to 2.5.   Hypothyroidism: Continue Synthroid .   Hyperlipidemia: Holding statins.   Chronic pain: Patient takes hydrocodone  at home. Patient and son asking for pain medication. Received oxycodone  last night. Will order low-dose oxycodone , hold for sedation, will avoid given during the day to avoid oversedation. Lidocaine  patch applied to the neck   Anxiety/depression: Continue velafaxine.  Generalized weakness: PT OT consulted home health to be arranged.    Obesity stage II Estimated body mass index is 37.49 kg/m  Intervention: Calorie restricted diet and daily exercise advised to lose body weight.  Lifestyle  modification discussed.    DVT prophylaxis:SCDs Code Status: Full code Family Communication: Son at bedside. Disposition Plan:    Status is: Inpatient Remains inpatient appropriate because: severity of illness.    Consultants:  None  Procedures: None  Antimicrobials:  Anti-infectives (From admission, onward)    Start     Dose/Rate Route Frequency Ordered Stop   10/08/23 2200  rifaximin  (XIFAXAN ) tablet 550 mg        550 mg Oral 2 times daily 10/08/23 1953         Subjective: Patient was seen and examined at bedside.Overnight events noted. During morning hours patient was more awake and alert, AO x 3.  Patient is moving bowels, denies any other complaints.  Objective: Vitals:   10/14/23 0549 10/14/23 1221 10/14/23 1909 10/15/23 0554  BP: 128/62 (!) 116/57 137/65 113/80  Pulse: 76 79 73 74  Resp: 19 18 19 19   Temp: 98.8 F (37.1 C) 98.7 F (37.1 C) 98.5 F (36.9 C) 97.7 F (36.5 C)  TempSrc: Oral     SpO2: 100% 100% 99% 99%  Weight:      Height:       No intake or output data in the 24 hours ending 10/15/23 1416 Filed Weights   10/08/23 1547  Weight: 93 kg    Examination:  General exam: Appears calm and comfortable, not in any acute distress. Respiratory system: CTA Bilaterally . Respiratory effort normal. RR 14 Cardiovascular system: S1 & S2 heard, RRR. No JVD, murmurs, rubs, gallops or clicks. No pedal edema. Gastrointestinal system: Abdomen is non distended, soft and non tender. Normal bowel sounds heard. Central nervous system: Alert and oriented x 2. No focal neurological deficits. Extremities: No edema, no cyanosis, no clubbing. Skin: No rashes, lesions or ulcers Psychiatry:  Judgement and insight appear normal. Mood & affect appropriate.     Data Reviewed: I have personally reviewed following labs and imaging studies  CBC: Recent Labs  Lab 10/08/23 1818 10/09/23 0524 10/10/23 0617 10/11/23 0524 10/14/23 0501 10/15/23 0536  WBC 8.6 6.2  6.1 7.2 6.3 5.7  NEUTROABS 5.1  --  3.3 4.4  --   --   HGB 14.0 12.7 11.4* 12.1 11.5* 11.8*  HCT 45.1 41.0 36.0 39.9 37.0 39.0  MCV 97.6 98.3 99.2 102.3* 99.2 100.3*  PLT 162 128* 128* 136* 125* 118*   Basic Metabolic Panel: Recent Labs  Lab 10/09/23 0524 10/10/23 0617 10/11/23 0524 10/12/23 0418 10/14/23 0501 10/15/23 0536  NA 138 144 142 142 139 140  K 3.6 3.5 3.6 3.6 3.4* 4.1  CL 109 116* 113* 111 110 109  CO2 19* 19* 18* 22 20* 22  GLUCOSE 90 121* 102* 108* 95 104*  BUN 21 23 24* 25* 25* 27*  CREATININE 0.89 0.82 0.92 0.89 0.90 1.01*  CALCIUM  8.8* 8.6* 8.9 8.8* 8.7* 9.0  MG 2.2  --   --   --  1.7 1.8  PHOS 3.1  --   --   --  3.4 3.5   GFR: Estimated Creatinine Clearance: 58.2 mL/min (A) (by C-G formula based on SCr of 1.01 mg/dL (H)). Liver Function Tests: Recent Labs  Lab 10/10/23 0617 10/11/23 0524 10/12/23 0418 10/14/23 0501 10/15/23 0536  AST 36 52* 50* 45* 38  ALT 31 36 35 37 36  ALKPHOS 113 117 114 113 116  BILITOT 1.8* 2.0* 1.6* 1.5* 1.5*  PROT 4.4* 5.0* 4.7* 4.8* 4.8*  ALBUMIN  2.6* 2.8* 2.7* 2.8* 2.8*   No results for input(s): LIPASE, AMYLASE in the last 168 hours. Recent Labs  Lab 10/08/23 1818 10/12/23 0418 10/14/23 0501 10/15/23 1102  AMMONIA 117* 100* 108* 149*   Coagulation Profile: Recent Labs  Lab 10/08/23 1818  INR 1.2   Cardiac Enzymes: No results for input(s): CKTOTAL, CKMB, CKMBINDEX, TROPONINI in the last 168 hours. BNP (last 3 results) No results for input(s): PROBNP in the last 8760 hours. HbA1C: No results for input(s): HGBA1C in the last 72 hours. CBG: Recent Labs  Lab 10/08/23 1815  GLUCAP 90   Lipid Profile: No results for input(s): CHOL, HDL, LDLCALC, TRIG, CHOLHDL, LDLDIRECT in the last 72 hours. Thyroid  Function Tests: No results for input(s): TSH, T4TOTAL, FREET4, T3FREE, THYROIDAB in the last 72 hours. Anemia Panel: No results for input(s): VITAMINB12, FOLATE,  FERRITIN, TIBC, IRON , RETICCTPCT in the last 72 hours. Sepsis Labs: No results for input(s): PROCALCITON, LATICACIDVEN in the last 168 hours.  No results found for this or any previous visit (from the past 240 hours).   Radiology Studies: No results found.  Scheduled Meds:  furosemide   80 mg Oral q AM   lactulose   30 g Oral TID   levothyroxine   175 mcg Oral Q0600   lidocaine   1 patch Transdermal Daily   lidocaine   1 patch Transdermal Q24H   megestrol   40 mg Oral BID   midodrine   2.5 mg Oral TID WC   multivitamin with minerals  1 tablet Oral Daily   pantoprazole   40 mg Oral BID   potassium chloride   20 mEq Oral BID   rifaximin   550 mg Oral BID   rOPINIRole   0.25 mg Oral QHS   senna-docusate  1 tablet Oral BID   spironolactone   50 mg Oral Daily   venlafaxine  XR  75 mg Oral  Q breakfast   [START ON 10/18/2023] Vitamin D  (Ergocalciferol )  50,000 Units Oral Q Wed   Continuous Infusions:   LOS: 7 days    Time spent: 35 Mins.    Elvan Sor, MD [facility]   If 7PM-7AM, please contact night-coverage

## 2023-10-16 ENCOUNTER — Other Ambulatory Visit: Payer: Self-pay | Admitting: Family Medicine

## 2023-10-16 DIAGNOSIS — R4182 Altered mental status, unspecified: Secondary | ICD-10-CM | POA: Diagnosis not present

## 2023-10-16 LAB — CBC
HCT: 40.4 % (ref 36.0–46.0)
Hemoglobin: 12.8 g/dL (ref 12.0–15.0)
MCH: 30.9 pg (ref 26.0–34.0)
MCHC: 31.7 g/dL (ref 30.0–36.0)
MCV: 97.6 fL (ref 80.0–100.0)
Platelets: 125 K/uL — ABNORMAL LOW (ref 150–400)
RBC: 4.14 MIL/uL (ref 3.87–5.11)
RDW: 16.7 % — ABNORMAL HIGH (ref 11.5–15.5)
WBC: 6.6 K/uL (ref 4.0–10.5)
nRBC: 0 % (ref 0.0–0.2)

## 2023-10-16 LAB — HEPATIC FUNCTION PANEL
ALT: 38 U/L (ref 0–44)
AST: 41 U/L (ref 15–41)
Albumin: 3 g/dL — ABNORMAL LOW (ref 3.5–5.0)
Alkaline Phosphatase: 133 U/L — ABNORMAL HIGH (ref 38–126)
Bilirubin, Direct: 0.7 mg/dL — ABNORMAL HIGH (ref 0.0–0.2)
Indirect Bilirubin: 0.9 mg/dL (ref 0.3–0.9)
Total Bilirubin: 1.6 mg/dL — ABNORMAL HIGH (ref 0.0–1.2)
Total Protein: 5.2 g/dL — ABNORMAL LOW (ref 6.5–8.1)

## 2023-10-16 LAB — BASIC METABOLIC PANEL WITH GFR
Anion gap: 8 (ref 5–15)
BUN: 29 mg/dL — ABNORMAL HIGH (ref 8–23)
CO2: 22 mmol/L (ref 22–32)
Calcium: 9.2 mg/dL (ref 8.9–10.3)
Chloride: 108 mmol/L (ref 98–111)
Creatinine, Ser: 1.18 mg/dL — ABNORMAL HIGH (ref 0.44–1.00)
GFR, Estimated: 51 mL/min — ABNORMAL LOW (ref >60)
Glucose, Bld: 118 mg/dL — ABNORMAL HIGH (ref 70–99)
Potassium: 4.6 mmol/L (ref 3.5–5.1)
Sodium: 138 mmol/L (ref 135–145)

## 2023-10-16 LAB — MAGNESIUM: Magnesium: 1.9 mg/dL (ref 1.7–2.4)

## 2023-10-16 LAB — PHOSPHORUS: Phosphorus: 3.5 mg/dL (ref 2.5–4.6)

## 2023-10-16 LAB — AMMONIA: Ammonia: 214 umol/L — ABNORMAL HIGH (ref 9–35)

## 2023-10-16 MED ORDER — METHOCARBAMOL 500 MG PO TABS
500.0000 mg | ORAL_TABLET | Freq: Once | ORAL | Status: AC
  Administered 2023-10-17: 500 mg via ORAL
  Filled 2023-10-16: qty 1

## 2023-10-16 NOTE — Plan of Care (Signed)

## 2023-10-16 NOTE — Progress Notes (Signed)
 PROGRESS NOTE    [name]  FMW:[id number] DOB: 08/15/1955 DOA: 10/08/2023 PCP: [name], MD   Brief Narrative:  This 68 year old female with past medical history significant for [name] with encephalopathy, short gut Syndrome with chronic diarrhea, stage IIIa CKD with anemia, hypothyroidism, anxiety/depression, chronic hypotension and hyperlipidemia who presented on 9/28 with altered mental status, elevated NIH 4 treated with lactulose  for hepatic encephalopathy.   Assessment & Plan:   Principal Problem:   AMS (altered mental status)  Acute hepatic encephalopathy: Patient presented with confusion and hyperammonemia,  ammonia level at 117.   Patient with history of cryptogenic NASH cirrhosis. Continue rifaximin  Continue lactulose  30 mg 3 times daily previously twice daily. Mental status Improved, more alert today, Would continue with current lactulose  dose.  Ammonia: 117---100 >108>149>214   Cirrhosis, NASH Continue with spironolactone ,  furosemide  and propranolol. INR 1.2 bilirubin 1.6   AKI: Received IV fluids in the ED, Creatinine peaked to 1.1 Monitor on diuretics.  AKI > Resolved.   Non anion gap metabolic acidosis: tx underlying conditions. Now resolved.   Chronic hypotension: Blood pressure has been elevated, plan to reduce midodrine  to 2.5.   Hypothyroidism: Continue Synthroid .   Hyperlipidemia: Holding statins.   Chronic pain: Patient takes hydrocodone  at home. Patient and son asking for pain medication. Received oxycodone  last night. Will order low-dose oxycodone , hold for sedation, will avoid given during the day to avoid oversedation. Lidocaine  patch applied to the neck   Anxiety/depression: Continue velafaxine.  Generalized weakness: PT OT consulted home health to be arranged.    Obesity stage II Estimated body mass index is 37.49 kg/m  Intervention: Calorie restricted diet and daily exercise advised to lose body weight.   Lifestyle modification discussed.    DVT prophylaxis:SCDs Code Status: Full code Family Communication: Son at bedside. Disposition Plan:    Status is: Inpatient Remains inpatient appropriate because: severity of illness.    Consultants:  None  Procedures: None  Antimicrobials:  Anti-infectives (From admission, onward)    Start     Dose/Rate Route Frequency Ordered Stop   10/08/23 2200  rifaximin  (XIFAXAN ) tablet 550 mg        550 mg Oral 2 times daily 10/08/23 1953         Subjective: Patient was seen and examined at bedside.Overnight events noted. Patient was laying comfortably in the bed but she was complaining of having backache, which is chronic.  Patient was advised to continue pain management as ordered. As per patient she is moving bowels, denied any abdominal pain. Patient's family does not want her to use opiates due to constipation.    Objective: Vitals:   10/15/23 0554 10/15/23 1710 10/15/23 2036 10/16/23 0518  BP: 113/80 98/78 115/66 115/66  Pulse: 74 73 72 82  Resp: 19 18    Temp: 97.7 F (36.5 C) 98.3 F (36.8 C) 98.3 F (36.8 C) 98.7 F (37.1 C)  TempSrc:  Oral Oral Oral  SpO2: 99% 100% 100% 100%  Weight:      Height:       No intake or output data in the 24 hours ending 10/16/23 1404 Filed Weights   10/08/23 1547  Weight: 93 kg    Examination:  General exam: Appears calm and comfortable, not in any acute distress. Respiratory system: CTA Bilaterally, Respiratory effort normal.  Cardiovascular system: S1 & S2 heard, RRR. No JVD, murmurs, rubs, gallops or clicks. No pedal edema. Gastrointestinal system: Abdomen is non distended, soft and non tender. Normal bowel  sounds heard. Central nervous system: Alert and oriented x 2. No focal neurological deficits. Extremities: No edema, no cyanosis, no clubbing. Skin: No rashes, lesions or ulcers Psychiatry: Judgement and insight appear normal. Mood & affect appropriate.     Data Reviewed: I  have personally reviewed following labs and imaging studies  CBC: Recent Labs  Lab 10/10/23 0617 10/11/23 0524 10/14/23 0501 10/15/23 0536 10/16/23 0440  WBC 6.1 7.2 6.3 5.7 6.6  NEUTROABS 3.3 4.4  --   --   --   HGB 11.4* 12.1 11.5* 11.8* 12.8  HCT 36.0 39.9 37.0 39.0 40.4  MCV 99.2 102.3* 99.2 100.3* 97.6  PLT 128* 136* 125* 118* 125*   Basic Metabolic Panel: Recent Labs  Lab 10/11/23 0524 10/12/23 0418 10/14/23 0501 10/15/23 0536 10/16/23 0440  NA 142 142 139 140 138  K 3.6 3.6 3.4* 4.1 4.6  CL 113* 111 110 109 108  CO2 18* 22 20* 22 22  GLUCOSE 102* 108* 95 104* 118*  BUN 24* 25* 25* 27* 29*  CREATININE 0.92 0.89 0.90 1.01* 1.18*  CALCIUM  8.9 8.8* 8.7* 9.0 9.2  MG  --   --  1.7 1.8 1.9  PHOS  --   --  3.4 3.5 3.5   GFR: Estimated Creatinine Clearance: 49.8 mL/min (A) (by C-G formula based on SCr of 1.18 mg/dL (H)). Liver Function Tests: Recent Labs  Lab 10/11/23 0524 10/12/23 0418 10/14/23 0501 10/15/23 0536 10/16/23 0440  AST 52* 50* 45* 38 41  ALT 36 35 37 36 38  ALKPHOS 117 114 113 116 133*  BILITOT 2.0* 1.6* 1.5* 1.5* 1.6*  PROT 5.0* 4.7* 4.8* 4.8* 5.2*  ALBUMIN  2.8* 2.7* 2.8* 2.8* 3.0*   No results for input(s): LIPASE, AMYLASE in the last 168 hours. Recent Labs  Lab 10/12/23 0418 10/14/23 0501 10/15/23 1102 10/16/23 0440  AMMONIA 100* 108* 149* 214*   Coagulation Profile: No results for input(s): INR, PROTIME in the last 168 hours.  Cardiac Enzymes: No results for input(s): CKTOTAL, CKMB, CKMBINDEX, TROPONINI in the last 168 hours. BNP (last 3 results) No results for input(s): PROBNP in the last 8760 hours. HbA1C: No results for input(s): HGBA1C in the last 72 hours. CBG: No results for input(s): GLUCAP in the last 168 hours.  Lipid Profile: No results for input(s): CHOL, HDL, LDLCALC, TRIG, CHOLHDL, LDLDIRECT in the last 72 hours. Thyroid  Function Tests: No results for input(s): TSH, T4TOTAL,  FREET4, T3FREE, THYROIDAB in the last 72 hours. Anemia Panel: No results for input(s): VITAMINB12, FOLATE, FERRITIN, TIBC, IRON , RETICCTPCT in the last 72 hours. Sepsis Labs: No results for input(s): PROCALCITON, LATICACIDVEN in the last 168 hours.  No results found for this or any previous visit (from the past 240 hours).   Radiology Studies: No results found.  Scheduled Meds:  furosemide   80 mg Oral q AM   lactulose   30 g Oral TID   levothyroxine   175 mcg Oral Q0600   lidocaine   1 patch Transdermal Daily   lidocaine   1 patch Transdermal Q24H   megestrol   40 mg Oral BID   midodrine   2.5 mg Oral TID WC   multivitamin with minerals  1 tablet Oral Daily   pantoprazole   40 mg Oral BID   potassium chloride   20 mEq Oral BID   rifaximin   550 mg Oral BID   rOPINIRole   0.25 mg Oral QHS   senna-docusate  1 tablet Oral BID   spironolactone   50 mg Oral Daily   venlafaxine   XR  75 mg Oral Q breakfast   [START ON 10/18/2023] Vitamin D  (Ergocalciferol )  50,000 Units Oral Q Wed   Continuous Infusions:   LOS: 8 days    Time spent: 35 Mins.    Elvan Sor, MD [facility]   If 7PM-7AM, please contact night-coverage

## 2023-10-16 NOTE — Progress Notes (Signed)
 Physical Therapy Treatment Patient Details Name: [name] MRN: [id number] DOB: 1955-06-26 Today's Date: 10/16/2023   History of Present Illness Patient is a 68 yo female presenting to the ED with AMS on 10/08/23. Admitted with same diagnosis, with noted elevated ammonia levels.  PMH includes decompensated cirrhosis, iron  deficiency anemia, anemia of chronic disease, cirrhosis, blind in L eye.    PT Comments  Pt agreeable to working with therapy. Min A for safe mobility. Tolerated activity well. Recommend HHPT.    If plan is discharge home, recommend the following: A little help with walking and/or transfers;A little help with bathing/dressing/bathroom;Assistance with cooking/housework;Help with stairs or ramp for entrance   Can travel by private vehicle        Equipment Recommendations  None recommended by PT    Recommendations for Other Services       Precautions / Restrictions Precautions Precautions: Fall Restrictions Weight Bearing Restrictions Per Provider Order: No     Mobility  Bed Mobility Overal bed mobility: Needs Assistance Bed Mobility: Supine to Sit     Supine to sit: Supervision, HOB elevated, Used rails Sit to supine: Supervision, HOB elevated, Used rails   General bed mobility comments: increased time. cues provided as needed.    Transfers Overall transfer level: Needs assistance Equipment used: Rolling walker (2 wheels) Transfers: Sit to/from Stand, Bed to chair/wheelchair/BSC Sit to Stand: Min assist Stand pivot transfers: Min assist         General transfer comment: Assist to rise, steady, control descent.Cues for safety, hand placement. Increased time.    Ambulation/Gait Ambulation/Gait assistance: Contact guard assist, Min assist Gait Distance (Feet): 75 Feet Assistive device: Rolling walker (2 wheels) Gait Pattern/deviations: Step-through pattern, Decreased stride length, Trunk flexed       General Gait Details: intermittent light  min A to negotiate obstacles with RW, no loss of balance. Tolerated distance well.   Stairs             Wheelchair Mobility     Tilt Bed    Modified Rankin (Stroke Patients Only)       Balance Overall balance assessment: Needs assistance         Standing balance support: Bilateral upper extremity supported, During functional activity, Reliant on assistive device for balance Standing balance-Leahy Scale: Poor                              Communication    Cognition Arousal: Alert Behavior During Therapy: WFL for tasks assessed/performed   PT - Cognitive impairments: No family/caregiver present to determine baseline                         Following commands: Impaired Following commands impaired: Only follows one step commands consistently    Cueing Cueing Techniques: Verbal cues  Exercises      General Comments        Pertinent Vitals/Pain Pain Assessment Pain Assessment: Faces Faces Pain Scale: No hurt    Home Living                          Prior Function            PT Goals (current goals can now be found in the care plan section) Progress towards PT goals: Progressing toward goals    Frequency    Min 3X/week      PT Plan  Co-evaluation              AM-PAC PT 6 Clicks Mobility   Outcome Measure  Help needed turning from your back to your side while in a flat bed without using bedrails?: None Help needed moving from lying on your back to sitting on the side of a flat bed without using bedrails?: A Little Help needed moving to and from a bed to a chair (including a wheelchair)?: A Little Help needed standing up from a chair using your arms (e.g., wheelchair or bedside chair)?: A Little Help needed to walk in hospital room?: A Little Help needed climbing 3-5 steps with a railing? : A Lot 6 Click Score: 18    End of Session Equipment Utilized During Treatment: Gait belt Activity  Tolerance: Patient tolerated treatment well Patient left: in bed;with call bell/phone within reach;with bed alarm set   PT Visit Diagnosis: Muscle weakness (generalized) (M62.81);Other abnormalities of gait and mobility (R26.89)     Time: 8375-8350 PT Time Calculation (min) (ACUTE ONLY): 25 min  Charges:    $Gait Training: 8-22 mins $Therapeutic Activity: 8-22 mins PT General Charges $$ ACUTE PT VISIT: 1 Visit                        Dannial SQUIBB, PT Acute Rehabilitation  Office: [phone]

## 2023-10-17 DIAGNOSIS — R4182 Altered mental status, unspecified: Secondary | ICD-10-CM | POA: Diagnosis not present

## 2023-10-17 LAB — CBC
HCT: 42.6 % (ref 36.0–46.0)
Hemoglobin: 13.2 g/dL (ref 12.0–15.0)
MCH: 30.9 pg (ref 26.0–34.0)
MCHC: 31 g/dL (ref 30.0–36.0)
MCV: 99.8 fL (ref 80.0–100.0)
Platelets: 128 K/uL — ABNORMAL LOW (ref 150–400)
RBC: 4.27 MIL/uL (ref 3.87–5.11)
RDW: 16.7 % — ABNORMAL HIGH (ref 11.5–15.5)
WBC: 7.1 K/uL (ref 4.0–10.5)
nRBC: 0 % (ref 0.0–0.2)

## 2023-10-17 LAB — HEPATIC FUNCTION PANEL
ALT: 37 U/L (ref 0–44)
AST: 44 U/L — ABNORMAL HIGH (ref 15–41)
Albumin: 3 g/dL — ABNORMAL LOW (ref 3.5–5.0)
Alkaline Phosphatase: 129 U/L — ABNORMAL HIGH (ref 38–126)
Bilirubin, Direct: 0.7 mg/dL — ABNORMAL HIGH (ref 0.0–0.2)
Indirect Bilirubin: 1.1 mg/dL — ABNORMAL HIGH (ref 0.3–0.9)
Total Bilirubin: 1.8 mg/dL — ABNORMAL HIGH (ref 0.0–1.2)
Total Protein: 5.3 g/dL — ABNORMAL LOW (ref 6.5–8.1)

## 2023-10-17 LAB — BASIC METABOLIC PANEL WITH GFR
Anion gap: 12 (ref 5–15)
BUN: 28 mg/dL — ABNORMAL HIGH (ref 8–23)
CO2: 19 mmol/L — ABNORMAL LOW (ref 22–32)
Calcium: 9.4 mg/dL (ref 8.9–10.3)
Chloride: 109 mmol/L (ref 98–111)
Creatinine, Ser: 1.11 mg/dL — ABNORMAL HIGH (ref 0.44–1.00)
GFR, Estimated: 55 mL/min — ABNORMAL LOW (ref >60)
Glucose, Bld: 98 mg/dL (ref 70–99)
Potassium: 4.3 mmol/L (ref 3.5–5.1)
Sodium: 139 mmol/L (ref 135–145)

## 2023-10-17 LAB — AMMONIA: Ammonia: 94 umol/L — ABNORMAL HIGH (ref 9–35)

## 2023-10-17 LAB — MAGNESIUM: Magnesium: 2.1 mg/dL (ref 1.7–2.4)

## 2023-10-17 LAB — PHOSPHORUS: Phosphorus: 3.8 mg/dL (ref 2.5–4.6)

## 2023-10-17 MED ORDER — SODIUM BICARBONATE 650 MG PO TABS
650.0000 mg | ORAL_TABLET | Freq: Four times a day (QID) | ORAL | Status: AC
  Administered 2023-10-17 (×2): 650 mg via ORAL
  Filled 2023-10-17 (×2): qty 1

## 2023-10-17 MED ORDER — BISACODYL 5 MG PO TBEC
5.0000 mg | DELAYED_RELEASE_TABLET | Freq: Every day | ORAL | Status: DC
Start: 2023-10-17 — End: 2023-10-23
  Administered 2023-10-17 – 2023-10-22 (×5): 5 mg via ORAL
  Filled 2023-10-17 (×5): qty 1

## 2023-10-17 MED ORDER — ORAL CARE MOUTH RINSE: 15.0000 mL | OROMUCOSAL | Status: DC | PRN

## 2023-10-17 NOTE — Plan of Care (Signed)
  Problem: Education: Goal: Knowledge of General Education information will improve Description: Including pain rating scale, medication(s)/side effects and non-pharmacologic comfort measures Outcome: Progressing   Problem: Health Behavior/Discharge Planning: Goal: Ability to manage health-related needs will improve Outcome: Progressing   Problem: Clinical Measurements: Goal: Ability to maintain clinical measurements within normal limits will improve Outcome: Progressing Goal: Will remain free from infection Outcome: Progressing   Problem: Activity: Goal: Risk for activity intolerance will decrease Outcome: Progressing   Problem: Nutrition: Goal: Adequate nutrition will be maintained Outcome: Progressing   Problem: Nutrition: Goal: Adequate nutrition will be maintained Outcome: Progressing   Problem: Coping: Goal: Level of anxiety will decrease Outcome: Progressing   Problem: Elimination: Goal: Will not experience complications related to bowel motility Outcome: Progressing   Problem: Pain Managment: Goal: General experience of comfort will improve and/or be controlled Outcome: Progressing   Problem: Safety: Goal: Ability to remain free from injury will improve Outcome: Progressing   Problem: Skin Integrity: Goal: Risk for impaired skin integrity will decrease Outcome: Progressing

## 2023-10-17 NOTE — Progress Notes (Signed)
 PROGRESS NOTE    [name]  FMW:[id number] DOB: [birth year] DOA: 10/08/2023 PCP: [name], MD   Brief Narrative:  This 68 year old female with past medical history significant for [name] with encephalopathy, short gut Syndrome with chronic diarrhea, stage IIIa CKD with anemia, hypothyroidism, anxiety/depression, chronic hypotension and hyperlipidemia who presented on 9/28 with altered mental status, elevated NIH 4 treated with lactulose  for hepatic encephalopathy.   Assessment & Plan:   Principal Problem:   AMS (altered mental status)  Acute hepatic encephalopathy: Patient presented with confusion and hyperammonemia,  ammonia level at 117.   Patient with history of cryptogenic NASH cirrhosis. Continue rifaximin  Continue lactulose  30 mg 3 times daily previously twice daily. Mental status Improved, more alert today, Would continue with current lactulose  dose.  Ammonia: 117---100 >108>149>214>94   Cirrhosis, NASH Continue with spironolactone ,  furosemide  and propranolol. INR 1.2 bilirubin 1.6   AKI: Received IV fluids in the ED, Creatinine peaked to 1.1 Monitor on diuretics.  AKI > Resolved.   Non anion gap metabolic acidosis: tx underlying conditions. Now resolved.   Chronic hypotension: Blood pressure has been elevated, plan to reduce midodrine  to 2.5.   Hypothyroidism: Continue Synthroid .   Hyperlipidemia: Holding statins.   Chronic pain: Patient takes hydrocodone  at home. Patient and son asking for pain medication. Received oxycodone  last night. Will order low-dose oxycodone , hold for sedation, will avoid given during the day to avoid oversedation. Lidocaine  patch applied to the neck   Anxiety/depression: Continue velafaxine.  Generalized weakness: PT OT consulted home health to be arranged.    Obesity stage II Estimated body mass index is 37.49 kg/m  Intervention: Calorie restricted diet and daily exercise advised to lose body weight.   Lifestyle modification discussed.    DVT prophylaxis:SCDs Code Status: Full code Family Communication: Son at bedside. Disposition Plan:    Status is: Inpatient Remains inpatient appropriate because: severity of illness.    Consultants:  None  Procedures: None  Antimicrobials:  Anti-infectives (From admission, onward)    Start     Dose/Rate Route Frequency Ordered Stop   10/08/23 2200  rifaximin  (XIFAXAN ) tablet 550 mg        550 mg Oral 2 times daily 10/08/23 1953         Subjective: Patient was seen and examined at bedside.Overnight events noted. Patient was complaining of significant backache.  Patient's son does not want her to get opiates as she becomes constipated and then her ammonia level goes up.  Advised to give 2.5 mg as needed once or twice in a day, which will not make her constipated as much and we can start Dulcolax nightly as well.  Which he agreed for. Patient did move bowels last night.  Denied any other complaints.    Objective: Vitals:   10/15/23 2036 10/16/23 0518 10/16/23 2047 10/17/23 0539  BP: 115/66 115/66 114/70 117/81  Pulse: 72 82 97 81  Resp:      Temp: 98.3 F (36.8 C) 98.7 F (37.1 C) 98.6 F (37 C) 99 F (37.2 C)  TempSrc: Oral Oral Oral   SpO2: 100% 100% 100% 97%  Weight:      Height:        Intake/Output Summary (Last 24 hours) at 10/17/2023 1511 Last data filed at 10/16/2023 1829 Gross per 24 hour  Intake 240 ml  Output --  Net 240 ml   Filed Weights   10/08/23 1547  Weight: 93 kg    Examination:  General exam: Appears calm and  comfortable, not in any acute distress. Respiratory system: CTA Bilaterally, Respiratory effort normal.  Cardiovascular system: S1 & S2 heard, RRR. No JVD, murmurs, rubs, gallops or clicks. No pedal edema. Gastrointestinal system: Abdomen is non distended, soft and non tender. Normal bowel sounds heard. Central nervous system: Alert and oriented x 2. No focal neurological  deficits. Extremities: No edema, no cyanosis, no clubbing. Skin: No rashes, lesions or ulcers Psychiatry: Judgement and insight appear normal. Mood & affect appropriate.     Data Reviewed: I have personally reviewed following labs and imaging studies  CBC: Recent Labs  Lab 10/11/23 0524 10/14/23 0501 10/15/23 0536 10/16/23 0440 10/17/23 0455  WBC 7.2 6.3 5.7 6.6 7.1  NEUTROABS 4.4  --   --   --   --   HGB 12.1 11.5* 11.8* 12.8 13.2  HCT 39.9 37.0 39.0 40.4 42.6  MCV 102.3* 99.2 100.3* 97.6 99.8  PLT 136* 125* 118* 125* 128*   Basic Metabolic Panel: Recent Labs  Lab 10/12/23 0418 10/14/23 0501 10/15/23 0536 10/16/23 0440 10/17/23 0455  NA 142 139 140 138 139  K 3.6 3.4* 4.1 4.6 4.3  CL 111 110 109 108 109  CO2 22 20* 22 22 19*  GLUCOSE 108* 95 104* 118* 98  BUN 25* 25* 27* 29* 28*  CREATININE 0.89 0.90 1.01* 1.18* 1.11*  CALCIUM  8.8* 8.7* 9.0 9.2 9.4  MG  --  1.7 1.8 1.9 2.1  PHOS  --  3.4 3.5 3.5 3.8   GFR: Estimated Creatinine Clearance: 53 mL/min (A) (by C-G formula based on SCr of 1.11 mg/dL (H)). Liver Function Tests: Recent Labs  Lab 10/12/23 0418 10/14/23 0501 10/15/23 0536 10/16/23 0440 10/17/23 0455  AST 50* 45* 38 41 44*  ALT 35 37 36 38 37  ALKPHOS 114 113 116 133* 129*  BILITOT 1.6* 1.5* 1.5* 1.6* 1.8*  PROT 4.7* 4.8* 4.8* 5.2* 5.3*  ALBUMIN  2.7* 2.8* 2.8* 3.0* 3.0*   No results for input(s): LIPASE, AMYLASE in the last 168 hours. Recent Labs  Lab 10/12/23 0418 10/14/23 0501 10/15/23 1102 10/16/23 0440 10/17/23 0455  AMMONIA 100* 108* 149* 214* 94*   Coagulation Profile: No results for input(s): INR, PROTIME in the last 168 hours.  Cardiac Enzymes: No results for input(s): CKTOTAL, CKMB, CKMBINDEX, TROPONINI in the last 168 hours. BNP (last 3 results) No results for input(s): PROBNP in the last 8760 hours. HbA1C: No results for input(s): HGBA1C in the last 72 hours. CBG: No results for input(s): GLUCAP in  the last 168 hours.  Lipid Profile: No results for input(s): CHOL, HDL, LDLCALC, TRIG, CHOLHDL, LDLDIRECT in the last 72 hours. Thyroid  Function Tests: No results for input(s): TSH, T4TOTAL, FREET4, T3FREE, THYROIDAB in the last 72 hours. Anemia Panel: No results for input(s): VITAMINB12, FOLATE, FERRITIN, TIBC, IRON , RETICCTPCT in the last 72 hours. Sepsis Labs: No results for input(s): PROCALCITON, LATICACIDVEN in the last 168 hours.  No results found for this or any previous visit (from the past 240 hours).   Radiology Studies: No results found.  Scheduled Meds:  bisacodyl  5-10 mg Oral QHS   furosemide   80 mg Oral q AM   lactulose   30 g Oral TID   levothyroxine   175 mcg Oral Q0600   lidocaine   1 patch Transdermal Daily   lidocaine   1 patch Transdermal Q24H   megestrol   40 mg Oral BID   midodrine   2.5 mg Oral TID WC   multivitamin with minerals  1 tablet Oral Daily  pantoprazole   40 mg Oral BID   potassium chloride   20 mEq Oral BID   rifaximin   550 mg Oral BID   rOPINIRole   0.25 mg Oral QHS   senna-docusate  1 tablet Oral BID   spironolactone   50 mg Oral Daily   venlafaxine  XR  75 mg Oral Q breakfast   [START ON 10/18/2023] Vitamin D  (Ergocalciferol )  50,000 Units Oral Q Wed   Continuous Infusions:   LOS: 9 days    Time spent: 35 Mins.    Elvan Sor, MD [facility]   If 7PM-7AM, please contact night-coverage

## 2023-10-17 NOTE — Progress Notes (Signed)
 Mobility Specialist - Progress Note    10/17/23 1304  Mobility  Activity Ambulated with assistance  Level of Assistance Minimal assist, patient does 75% or more  Assistive Device Front wheel walker  Distance Ambulated (ft) 6 ft  Activity Response Tolerated fair  Mobility Referral Yes  Mobility visit 1 Mobility  Mobility Specialist Start Time (ACUTE ONLY) 1250  Mobility Specialist Stop Time (ACUTE ONLY) 1305  Mobility Specialist Time Calculation (min) (ACUTE ONLY) 15 min   Pt was received in bed and agreed to mobility. Min A from sit to stand. Became weak during ambulation. Pulled chair behind to assist. Returned back to recliner with all needs met. Call bell in reach. Left in room with family.  [employer] - Mobility Specialist

## 2023-10-17 NOTE — TOC Progression Note (Signed)
 Transition of Care [facility]) - Progression Note    Patient Details  Name: [name] MRN: [id number] Date of Birth: 02-10-1955  [facility] CM/SW Contact  Doneta Glenys DASEN, RN Phone Number: 10/17/2023, 1:47 PM  Clinical Narrative:    No additional need identified.   Expected Discharge Plan: Home w Home Health Services Barriers to Discharge: Continued Medical Work up               Expected Discharge Plan and Services In-house Referral: Clinical Social Work Discharge Planning Services: NA Post Acute Care Choice: Home Health Living arrangements for the past 2 months: Single Family Home                 DME Arranged: N/A DME Agency: NA       HH Arranged: RN, PT, OT, Nurse's Aide HH Agency: [facility] Date HH Agency Contacted: 10/11/23 Time HH Agency Contacted: 1510 Representative spoke with at [facility] Agency: Amy   Social Drivers of Health (SDOH) Interventions SDOH Screenings   Food Insecurity: No Food Insecurity (10/09/2023)  Housing: Low Risk  (10/09/2023)  Transportation Needs: No Transportation Needs (10/09/2023)  Utilities: Not At Risk (10/09/2023)  Alcohol Screen: Low Risk  (09/20/2023)  Depression (PHQ2-9): Medium Risk (09/20/2023)  Financial Resource Strain: Low Risk  (09/20/2023)  Physical Activity: Inactive (09/20/2023)  Social Connections: Unknown (10/09/2023)  Stress: No Stress Concern Present (09/20/2023)  Tobacco Use: Medium Risk (10/13/2023)  Health Literacy: Adequate Health Literacy (09/20/2023)    Readmission Risk Interventions    10/10/2023    2:52 PM 08/25/2023   10:04 AM 08/09/2023   12:37 PM  Readmission Risk Prevention Plan  Transportation Screening Complete Complete   Medication Review Oceanographer) Complete Complete Complete  PCP or Specialist appointment within 3-5 days of discharge Complete  Complete  HRI or Home Care Consult Complete Complete Complete  SW Recovery Care/Counseling Consult Complete Complete Complete   Palliative Care Screening Not Applicable Not Applicable Not Applicable  Skilled Nursing Facility Not Applicable Not Applicable Not Applicable

## 2023-10-18 LAB — CBC
HCT: 41.2 % (ref 36.0–46.0)
Hemoglobin: 13 g/dL (ref 12.0–15.0)
MCH: 31.4 pg (ref 26.0–34.0)
MCHC: 31.6 g/dL (ref 30.0–36.0)
MCV: 99.5 fL (ref 80.0–100.0)
Platelets: 149 K/uL — ABNORMAL LOW (ref 150–400)
RBC: 4.14 MIL/uL (ref 3.87–5.11)
RDW: 16.7 % — ABNORMAL HIGH (ref 11.5–15.5)
WBC: 7.9 K/uL (ref 4.0–10.5)
nRBC: 0 % (ref 0.0–0.2)

## 2023-10-18 LAB — BASIC METABOLIC PANEL WITH GFR
Anion gap: 11 (ref 5–15)
BUN: 30 mg/dL — ABNORMAL HIGH (ref 8–23)
CO2: 20 mmol/L — ABNORMAL LOW (ref 22–32)
Calcium: 9.2 mg/dL (ref 8.9–10.3)
Chloride: 110 mmol/L (ref 98–111)
Creatinine, Ser: 1.32 mg/dL — ABNORMAL HIGH (ref 0.44–1.00)
GFR, Estimated: 44 mL/min — ABNORMAL LOW (ref >60)
Glucose, Bld: 107 mg/dL — ABNORMAL HIGH (ref 70–99)
Potassium: 3.8 mmol/L (ref 3.5–5.1)
Sodium: 141 mmol/L (ref 135–145)

## 2023-10-18 LAB — HEPATIC FUNCTION PANEL
ALT: 34 U/L (ref 0–44)
AST: 36 U/L (ref 15–41)
Albumin: 3 g/dL — ABNORMAL LOW (ref 3.5–5.0)
Alkaline Phosphatase: 145 U/L — ABNORMAL HIGH (ref 38–126)
Bilirubin, Direct: 0.8 mg/dL — ABNORMAL HIGH (ref 0.0–0.2)
Indirect Bilirubin: 0.7 mg/dL (ref 0.3–0.9)
Total Bilirubin: 1.5 mg/dL — ABNORMAL HIGH (ref 0.0–1.2)
Total Protein: 5.5 g/dL — ABNORMAL LOW (ref 6.5–8.1)

## 2023-10-18 LAB — PHOSPHORUS: Phosphorus: 3.4 mg/dL (ref 2.5–4.6)

## 2023-10-18 LAB — MAGNESIUM: Magnesium: 2 mg/dL (ref 1.7–2.4)

## 2023-10-18 LAB — AMMONIA: Ammonia: 161 umol/L — ABNORMAL HIGH (ref 9–35)

## 2023-10-18 MED ORDER — LACTULOSE 10 GM/15ML PO SOLN
30.0000 g | Freq: Four times a day (QID) | ORAL | Status: DC
  Administered 2023-10-18 – 2023-10-22 (×18): 30 g via ORAL
  Filled 2023-10-18 (×18): qty 45

## 2023-10-18 MED ORDER — ALBUMIN HUMAN 5 % IV SOLN
25.0000 g | Freq: Once | INTRAVENOUS | Status: AC
  Administered 2023-10-18: 12.5 g via INTRAVENOUS
  Filled 2023-10-18: qty 500

## 2023-10-18 MED ORDER — METHOCARBAMOL 500 MG PO TABS
500.0000 mg | ORAL_TABLET | Freq: Once | ORAL | Status: AC
Start: 2023-10-19 — End: 2023-10-19
  Administered 2023-10-19: 500 mg via ORAL
  Filled 2023-10-18: qty 1

## 2023-10-18 NOTE — Plan of Care (Signed)
  Problem: Health Behavior/Discharge Planning: Goal: Ability to manage health-related needs will improve Outcome: Progressing   Problem: Clinical Measurements: Goal: Ability to maintain clinical measurements within normal limits will improve Outcome: Progressing Goal: Will remain free from infection Outcome: Progressing Goal: Diagnostic test results will improve Outcome: Progressing Goal: Respiratory complications will improve Outcome: Progressing Goal: Cardiovascular complication will be avoided Outcome: Progressing   Problem: Activity: Goal: Risk for activity intolerance will decrease Outcome: Progressing   Problem: Nutrition: Goal: Adequate nutrition will be maintained Outcome: Progressing   Problem: Coping: Goal: Level of anxiety will decrease Outcome: Progressing   Problem: Pain Managment: Goal: General experience of comfort will improve and/or be controlled Outcome: Progressing   Problem: Safety: Goal: Ability to remain free from injury will improve Outcome: Progressing   Problem: Skin Integrity: Goal: Risk for impaired skin integrity will decrease Outcome: Progressing

## 2023-10-18 NOTE — Progress Notes (Signed)
 OT Cancellation Note  Patient Details Name: [name] MRN: [id number] DOB: 04-27-1955   Cancelled Treatment:    Reason Eval/Treat Not Completed: Medical issues which prohibited therapy. Nursing asking therapist to return tomorrow as pt is lethargic and not feeling well today.  [name] 10/18/2023, 10:44 AM

## 2023-10-18 NOTE — Progress Notes (Signed)
 PT Cancellation Note  Patient Details Name: [name] MRN: [id number] DOB: Aug 18, 1955   Cancelled Treatment:    Reason Eval/Treat Not Completed: Medical issues which prohibited therapy. Pt has elevated ammonia levels today impacting pt's alertness and ability to participate with PT. PT to return as schedule allows and to continue to follow acutely.   [place], PT Acute Rehab   [place] VEAR Drone 10/18/2023, 12:24 PM

## 2023-10-18 NOTE — Progress Notes (Signed)
  Progress Note   Patient: [name] FMW:[id number] DOB: 09/24/1955 DOA: 10/08/2023     10 DOS: the patient was seen and examined on 10/18/2023   Brief hospital course: 68yo with h/o NASH cirrhosis with encephalopathy, short gut syndrome with chronic diarrhea, stage 3a CKD with anemia, hypothyroidism, anxiety/depression, chronic hypotension, and HLD who presented on 9/28 with AMS.  Elevated NH4, treated with lactulose  for hepatic encephalopathy.  Assessment and Plan:  #Acute hepatic encephalopathy #NASH cirrhosis - Patient presented with acute encephalopathy and hyperammonemia.  - Despite initial improvement in mental status, patient appears to be worsening today - Ammonia elevated to 161 today - Reportedly only had 1 bowel movement today - Increased lactulose  to 30 g QID  #AKI  - Creatinine elevated to 1.32 today, baseline around 0.9 - Held home Lasix  and spironolactone   - Ordered albumin  5% 25 g once  #History of chronic hypotension #Hypertension - SBP's between 120-147 today - Will hold midodrine  at this time and continue to monitor blood pressure  #Hypothyroidism - Continue home levothyroxine    #Chronic pain - Cautious with over sedation - Continue as needed oxycodone  2.5 mg twice daily and lidocaine  patch  # Anxiety #Depression - Continue venlafaxine    #Generalized weakness - PT/OT consulted for home health     Subjective: Patient evaluated at bedside today.  Nursing staff communicated concern for worsening mental status since yesterday.  Patient appeared extremely lethargic, unable to answer questions or follow commands.  Physical Exam: Vitals:   10/17/23 1954 10/18/23 0422 10/18/23 0947 10/18/23 1254  BP: 132/68 120/64 136/86 (!) 147/78  Pulse: (!) 104 96 (!) 110 93  Resp: 20 20 20 18   Temp: 97.9 F (36.6 C) 99 F (37.2 C) (!) 97.5 F (36.4 C) 98 F (36.7 C)  TempSrc:      SpO2: 98% 97% 98% 99%  Weight:      Height:       Physical  Exam Constitutional:      General: She is not in acute distress.    Appearance: She is ill-appearing.  HENT:     Mouth/Throat:     Mouth: Mucous membranes are dry.  Cardiovascular:     Rate and Rhythm: Normal rate and regular rhythm.     Heart sounds: Normal heart sounds. No murmur heard. Pulmonary:     Effort: Pulmonary effort is normal. No respiratory distress.     Breath sounds: Normal breath sounds. No wheezing.  Abdominal:     General: Bowel sounds are normal. There is no distension.     Palpations: Abdomen is soft.     Tenderness: There is no abdominal tenderness. There is no guarding.  Musculoskeletal:     Right lower leg: No edema.     Left lower leg: No edema.  Skin:    General: Skin is warm and dry.     Capillary Refill: Capillary refill takes less than 2 seconds.  Neurological:     Mental Status: She is lethargic and disoriented.     Family Communication: Discussed with patient's son at bedside  Disposition: Status is: Inpatient Remains inpatient appropriate because: continued encephalopathy   Planned Discharge Destination: Home with Home Health    Time spent: 45 minutes  Author: Duffy Larch, MD 10/18/2023 7:16 PM  For on call review [email or web address].

## 2023-10-19 LAB — HEPATIC FUNCTION PANEL
ALT: 34 U/L (ref 0–44)
AST: 45 U/L — ABNORMAL HIGH (ref 15–41)
Albumin: 2.9 g/dL — ABNORMAL LOW (ref 3.5–5.0)
Alkaline Phosphatase: 122 U/L (ref 38–126)
Bilirubin, Direct: 0.9 mg/dL — ABNORMAL HIGH (ref 0.0–0.2)
Indirect Bilirubin: 1.1 mg/dL — ABNORMAL HIGH (ref 0.3–0.9)
Total Bilirubin: 2 mg/dL — ABNORMAL HIGH (ref 0.0–1.2)
Total Protein: 4.8 g/dL — ABNORMAL LOW (ref 6.5–8.1)

## 2023-10-19 LAB — BASIC METABOLIC PANEL WITH GFR
Anion gap: 9 (ref 5–15)
BUN: 25 mg/dL — ABNORMAL HIGH (ref 8–23)
CO2: 19 mmol/L — ABNORMAL LOW (ref 22–32)
Calcium: 8.8 mg/dL — ABNORMAL LOW (ref 8.9–10.3)
Chloride: 111 mmol/L (ref 98–111)
Creatinine, Ser: 1.06 mg/dL — ABNORMAL HIGH (ref 0.44–1.00)
GFR, Estimated: 58 mL/min — ABNORMAL LOW (ref >60)
Glucose, Bld: 112 mg/dL — ABNORMAL HIGH (ref 70–99)
Potassium: 4 mmol/L (ref 3.5–5.1)
Sodium: 139 mmol/L (ref 135–145)

## 2023-10-19 LAB — CBC
HCT: 38.8 % (ref 36.0–46.0)
Hemoglobin: 12.3 g/dL (ref 12.0–15.0)
MCH: 31.3 pg (ref 26.0–34.0)
MCHC: 31.7 g/dL (ref 30.0–36.0)
MCV: 98.7 fL (ref 80.0–100.0)
Platelets: 130 K/uL — ABNORMAL LOW (ref 150–400)
RBC: 3.93 MIL/uL (ref 3.87–5.11)
RDW: 16.3 % — ABNORMAL HIGH (ref 11.5–15.5)
WBC: 6.7 K/uL (ref 4.0–10.5)
nRBC: 0 % (ref 0.0–0.2)

## 2023-10-19 LAB — PROTIME-INR
INR: 1.4 — ABNORMAL HIGH (ref 0.8–1.2)
Prothrombin Time: 17.5 s — ABNORMAL HIGH (ref 11.4–15.2)

## 2023-10-19 LAB — AMMONIA: Ammonia: 86 umol/L — ABNORMAL HIGH (ref 9–35)

## 2023-10-19 MED ORDER — HEPARIN SODIUM (PORCINE) 5000 UNIT/ML IJ SOLN
5000.0000 [IU] | Freq: Three times a day (TID) | INTRAMUSCULAR | Status: DC
  Administered 2023-10-19 – 2023-10-23 (×12): 5000 [IU] via SUBCUTANEOUS
  Filled 2023-10-19 (×12): qty 1

## 2023-10-19 NOTE — Progress Notes (Signed)
 Progress Note   Patient: [name] FMW:[id number] DOB: [birth year] DOA: 10/08/2023     11 DOS: the patient was seen and examined on 10/19/2023   Brief hospital course: 68yo with h/o NASH cirrhosis with encephalopathy, short gut syndrome with chronic diarrhea, stage 3a CKD with anemia, hypothyroidism, anxiety/depression, chronic hypotension, and HLD who presented on 9/28 with AMS.  Elevated NH4, treated with lactulose  for hepatic encephalopathy.  Assessment and Plan:  #Acute hepatic encephalopathy #NASH cirrhosis - Patient presented with acute encephalopathy and hyperammonemia.  - Improvement in mental status today, AAOx1 (person) but not yet at baseline per patient's son, [name] - MELD Na score (10/9) -- 13 (6.0% estimate 34-month mortality) - Ammonia improved to 86 today - Reportedly had 3 BMs bowel movement today - Continue lactulose  to 30 g QID  #AKI - improving - Creatinine elevated to 1.32 on 10/8, baseline around 0.9 - Continue to hold home Lasix  and spironolactone  - Gave albumin  5% 25 g once on 10/8 - Cr improved to 1.06 today   #History of chronic hypotension #Hypertension - SBP's in 140s today - Will continue to hold midodrine  at this time and continue to monitor blood pressures  #Hypothyroidism - Continue home levothyroxine    #Chronic pain - Cautious with over sedation - Continue as needed oxycodone  2.5 mg twice daily and lidocaine  patch  # Anxiety #Depression - Continue venlafaxine    #Generalized weakness - PT/OT consulted for home health  #Class II Obesity (BMI Body mass index is 37.49 kg/m. kg/m) - Obesity is clinically significant and contributes to NASH, chronic pain, hypertension.  - Discussed lifestyle modification including dietary changes, regular physical activity, and weight reduction strategies. Will continue to monitor weight and BMI during hospitalization.       Subjective: Patient evaluated at bedside today.  No acute events overnight.  Patient had 3 large BM last night and 1 large BM this morning. Mental status improving since yesterday, now AAOx1 (person) and able to identify son, but not place or time. Her son, [name], states that this is an improvement but still not her baseline.   Physical Exam: Vitals:   10/18/23 0947 10/18/23 1254 10/18/23 1949 10/19/23 0534  BP: 136/86 (!) 147/78 130/73 (!) 145/82  Pulse: (!) 110 93 (!) 109 88  Resp: 20 18 19    Temp: (!) 97.5 F (36.4 C) 98 F (36.7 C) 98 F (36.7 C) 98.5 F (36.9 C)  TempSrc:      SpO2: 98% 99% 100% 98%  Weight:      Height:       Physical Exam Constitutional:      General: She is awake. She is not in acute distress.    Appearance: She is ill-appearing.  HENT:     Mouth/Throat:     Mouth: Mucous membranes are dry.  Cardiovascular:     Rate and Rhythm: Normal rate and regular rhythm.     Heart sounds: Normal heart sounds. No murmur heard. Pulmonary:     Effort: Pulmonary effort is normal. No respiratory distress.     Breath sounds: Normal breath sounds. No wheezing.  Abdominal:     General: Bowel sounds are normal. There is no distension.     Palpations: Abdomen is soft.     Tenderness: There is no abdominal tenderness. There is no guarding.  Musculoskeletal:     Right lower leg: No edema.     Left lower leg: No edema.  Skin:    General: Skin is warm and dry.  Capillary Refill: Capillary refill takes less than 2 seconds.  Neurological:     Mental Status: She is alert.     Comments: AAOx1 (person). Follows commands, answers some questions.      Family Communication: Discussed with patient's son, [name], at bedside  Disposition: Status is: Inpatient Remains inpatient appropriate because: continued encephalopathy   Planned Discharge Destination: Home with Home Health    Time spent: 45 minutes  Author: Ladarryl Wrage Al-Sultani, MD 10/19/2023 5:51 AM  For on call review [email or web address].

## 2023-10-19 NOTE — Progress Notes (Signed)
 PT Cancellation Note  Patient Details Name: [name] MRN: [id number] DOB: 1955/12/23   Cancelled Treatment:    Reason Eval/Treat Not Completed: Medical issues which prohibited therapy Pt with decreased alertness again today.     Tari CROME Payson 10/19/2023, 3:56 PM Tari KLEIN, DPT Physical Therapist Acute Rehabilitation Services Office: [phone]

## 2023-10-19 NOTE — Plan of Care (Signed)
  Problem: Health Behavior/Discharge Planning: Goal: Ability to manage health-related needs will improve Outcome: Progressing   Problem: Clinical Measurements: Goal: Ability to maintain clinical measurements within normal limits will improve Outcome: Progressing Goal: Will remain free from infection Outcome: Progressing Goal: Diagnostic test results will improve Outcome: Progressing Goal: Respiratory complications will improve Outcome: Progressing Goal: Cardiovascular complication will be avoided Outcome: Progressing   Problem: Activity: Goal: Risk for activity intolerance will decrease Outcome: Progressing   Problem: Nutrition: Goal: Adequate nutrition will be maintained Outcome: Progressing   Problem: Coping: Goal: Level of anxiety will decrease Outcome: Progressing   Problem: Pain Managment: Goal: General experience of comfort will improve and/or be controlled Outcome: Progressing   Problem: Safety: Goal: Ability to remain free from injury will improve Outcome: Progressing   Problem: Skin Integrity: Goal: Risk for impaired skin integrity will decrease Outcome: Progressing

## 2023-10-19 NOTE — Plan of Care (Signed)
   Problem: Activity: Goal: Risk for activity intolerance will decrease Outcome: Progressing   Problem: Coping: Goal: Level of anxiety will decrease Outcome: Progressing   Problem: Safety: Goal: Ability to remain free from injury will improve Outcome: Progressing   Problem: Skin Integrity: Goal: Risk for impaired skin integrity will decrease Outcome: Progressing

## 2023-10-20 DIAGNOSIS — K7682 Hepatic encephalopathy: Secondary | ICD-10-CM | POA: Diagnosis not present

## 2023-10-20 LAB — COMPREHENSIVE METABOLIC PANEL WITH GFR
ALT: 35 U/L (ref 0–44)
AST: 52 U/L — ABNORMAL HIGH (ref 15–41)
Albumin: 3 g/dL — ABNORMAL LOW (ref 3.5–5.0)
Alkaline Phosphatase: 123 U/L (ref 38–126)
Anion gap: 9 (ref 5–15)
BUN: 24 mg/dL — ABNORMAL HIGH (ref 8–23)
CO2: 18 mmol/L — ABNORMAL LOW (ref 22–32)
Calcium: 8.7 mg/dL — ABNORMAL LOW (ref 8.9–10.3)
Chloride: 116 mmol/L — ABNORMAL HIGH (ref 98–111)
Creatinine, Ser: 0.9 mg/dL (ref 0.44–1.00)
GFR, Estimated: >60 mL/min (ref >60)
Glucose, Bld: 118 mg/dL — ABNORMAL HIGH (ref 70–99)
Potassium: 3.9 mmol/L (ref 3.5–5.1)
Sodium: 143 mmol/L (ref 135–145)
Total Bilirubin: 2.5 mg/dL — ABNORMAL HIGH (ref 0.0–1.2)
Total Protein: 4.8 g/dL — ABNORMAL LOW (ref 6.5–8.1)

## 2023-10-20 LAB — AMMONIA: Ammonia: 124 umol/L — ABNORMAL HIGH (ref 9–35)

## 2023-10-20 LAB — CBC
HCT: 38.3 % (ref 36.0–46.0)
Hemoglobin: 11.9 g/dL — ABNORMAL LOW (ref 12.0–15.0)
MCH: 31 pg (ref 26.0–34.0)
MCHC: 31.1 g/dL (ref 30.0–36.0)
MCV: 99.7 fL (ref 80.0–100.0)
Platelets: 106 K/uL — ABNORMAL LOW (ref 150–400)
RBC: 3.84 MIL/uL — ABNORMAL LOW (ref 3.87–5.11)
RDW: 16.2 % — ABNORMAL HIGH (ref 11.5–15.5)
WBC: 5.9 K/uL (ref 4.0–10.5)
nRBC: 0 % (ref 0.0–0.2)

## 2023-10-20 NOTE — Plan of Care (Signed)
  Problem: Clinical Measurements: Goal: Diagnostic test results will improve Outcome: Progressing   Problem: Nutrition: Goal: Adequate nutrition will be maintained Outcome: Progressing   Problem: Skin Integrity: Goal: Risk for impaired skin integrity will decrease Outcome: Progressing   

## 2023-10-20 NOTE — Plan of Care (Signed)
  Problem: Health Behavior/Discharge Planning: Goal: Ability to manage health-related needs will improve Outcome: Progressing   Problem: Clinical Measurements: Goal: Ability to maintain clinical measurements within normal limits will improve Outcome: Progressing Goal: Will remain free from infection Outcome: Progressing Goal: Diagnostic test results will improve Outcome: Progressing Goal: Respiratory complications will improve Outcome: Progressing Goal: Cardiovascular complication will be avoided Outcome: Progressing   Problem: Activity: Goal: Risk for activity intolerance will decrease Outcome: Progressing   Problem: Nutrition: Goal: Adequate nutrition will be maintained Outcome: Progressing   Problem: Coping: Goal: Level of anxiety will decrease Outcome: Progressing   Problem: Pain Managment: Goal: General experience of comfort will improve and/or be controlled Outcome: Progressing   Problem: Safety: Goal: Ability to remain free from injury will improve Outcome: Progressing   Problem: Skin Integrity: Goal: Risk for impaired skin integrity will decrease Outcome: Progressing

## 2023-10-20 NOTE — Progress Notes (Signed)
 Occupational Therapy Treatment Patient Details Name: [name] MRN: [id number] DOB: 1955/08/12 Today's Date: 10/20/2023   History of present illness Patient is a 68 yo female presenting to the ED with AMS on 10/08/23. Admitted with same diagnosis, with noted elevated ammonia levels.  PMH includes decompensated cirrhosis, iron  deficiency anemia, anemia of chronic disease, cirrhosis, blind in L eye.   OT comments  Pt making progress towards goals, alertness and overall mental status much improved this session. Pt is able to complete bed mobility with MOD A provided for trunk support and HHA from son. Pt completes STS transfers with RW and CGA, functional mobility 100 ft using RW with close chair follow for safety and up to MIN A for AD mgmt, cues for safety/sequencing throughout. Pt reports bilateral foot pain, and requires 1x standing rest break due to fatigue. Discharge recommendation appropriate, OT will follow acutely.       If plan is discharge home, recommend the following:  A little help with walking and/or transfers;A little help with bathing/dressing/bathroom;Assistance with cooking/housework;Direct supervision/assist for medications management;Direct supervision/assist for financial management;Assist for transportation;Help with stairs or ramp for entrance;Supervision due to cognitive status   Equipment Recommendations  None recommended by OT       Precautions / Restrictions Precautions Precautions: Fall Recall of Precautions/Restrictions: Impaired Restrictions Weight Bearing Restrictions Per Provider Order: No       Mobility Bed Mobility Overal bed mobility: Needs Assistance Bed Mobility: Supine to Sit Rolling: Min assist   Supine to sit: HOB elevated, Used rails, Mod assist Sit to supine: HOB elevated, Used rails, Mod assist   General bed mobility comments: rolling L<>R, son providing HHA and OT assisting with trunk elevation    Transfers Overall transfer level:  Needs assistance Equipment used: Rolling walker (2 wheels) Transfers: Sit to/from Stand Sit to Stand: Contact guard assist           General transfer comment: able to rise, CGA for safety, cues for redirection     Balance Overall balance assessment: Needs assistance Sitting-balance support: Feet supported Sitting balance-Leahy Scale: Fair     Standing balance support: Bilateral upper extremity supported, During functional activity, Reliant on assistive device for balance Standing balance-Leahy Scale: Fair                             ADL either performed or assessed with clinical judgement   ADL Overall ADL's : Needs assistance/impaired                                     Functional mobility during ADLs: Cueing for safety;Cueing for sequencing;Rolling walker (2 wheels);Minimal assistance;Caregiver able to provide necessary level of assistance (~100 ft in hallway with RW and close chair follow, 1 x standing rest break) General ADL Comments: pt seen for functional mobiity progression as mental status / alertness much improved. son present, and assisted with facilitation + motivation throughout session. son reports pt still demonstrating confusion, but improved from previous days     Communication Communication Communication: No apparent difficulties   Cognition Arousal: Alert Behavior During Therapy: WFL for tasks assessed/performed Cognition: History of cognitive impairments   Orientation impairments: Time Awareness: Online awareness impaired Memory impairment (select all impairments): Short-term memory, Working memory Attention impairment (select first level of impairment): Sustained attention Executive functioning impairment (select all impairments): Organization, Sequencing, Reasoning, Problem solving, Initiation OT -  Cognition Comments: improved cognition, son endorses still not baseline                 Following commands:  Impaired Following commands impaired: Only follows one step commands consistently      Cueing   Cueing Techniques: Verbal cues             Pertinent Vitals/ Pain       Pain Assessment Pain Assessment: Faces Faces Pain Scale: Hurts a little bit Pain Location: both feet Pain Descriptors / Indicators: Aching Pain Intervention(s): Limited activity within patient's tolerance, Monitored during session, Repositioned         Frequency  Min 2X/week        Progress Toward Goals  OT Goals(current goals can now be found in the care plan section)  Progress towards OT goals: Progressing toward goals  Acute Rehab OT Goals OT Goal Formulation: With patient/family Time For Goal Achievement: 10/23/23 Potential to Achieve Goals: Good ADL Goals Pt Will Perform Lower Body Bathing: with set-up;sit to/from stand;sitting/lateral leans Pt Will Perform Lower Body Dressing: with set-up;sit to/from stand;sitting/lateral leans Pt Will Transfer to Toilet: with set-up;ambulating;regular height toilet Pt Will Perform Toileting - Clothing Manipulation and hygiene: with set-up;sit to/from stand;sitting/lateral leans Additional ADL Goal #1: Patient will be able to demonstrate increased online awareness in order to be able to return home safely with supervision. Additional ADL Goal #2: Patient will be able to complete functional task in standing for 3-5 minutes without need for seated rest break in order to increase overall activity tolerance.  Plan         AM-PAC OT 6 Clicks Daily Activity     Outcome Measure   Help from another person eating meals?: A Little Help from another person taking care of personal grooming?: A Little Help from another person toileting, which includes using toliet, bedpan, or urinal?: A Little Help from another person bathing (including washing, rinsing, drying)?: A Little Help from another person to put on and taking off regular upper body clothing?: A Little Help  from another person to put on and taking off regular lower body clothing?: A Little 6 Click Score: 18    End of Session Equipment Utilized During Treatment: Gait belt;Rolling walker (2 wheels)  OT Visit Diagnosis: Unsteadiness on feet (R26.81);Other abnormalities of gait and mobility (R26.89);Muscle weakness (generalized) (M62.81);History of falling (Z91.81);Other symptoms and signs involving cognitive function   Activity Tolerance Patient tolerated treatment well   Patient Left in bed;with call bell/phone within reach;with bed alarm set   Nurse Communication Mobility status        Time: 8542-8477 OT Time Calculation (min): 25 min  Charges: OT General Charges $OT Visit: 1 Visit OT Treatments $Therapeutic Activity: 23-37 mins  Eldean Nanna L. Shelbe Haglund, OTR/L  10/20/23, 4:49 PM

## 2023-10-20 NOTE — Progress Notes (Signed)
 Progress Note   Patient: [name] FMW:[id number] DOB: 04/09/1955 DOA: 10/08/2023     12 DOS: the patient was seen and examined on 10/20/2023   Brief hospital course: 68yo with h/o NASH cirrhosis with encephalopathy, short gut syndrome with chronic diarrhea, stage 3a CKD with anemia, hypothyroidism, anxiety/depression, chronic hypotension, and HLD who presented on 9/28 with AMS.  Elevated NH4, treated with lactulose  for hepatic encephalopathy.  Worsened after she was given narcotics, recurrent elevated NH4 now improving.  Assessment and Plan:  Hepatic encephalopathy Patient with known cryptogenic/NASH cirrhosis which has been decompensated with encephalopathy Presented with confusion and hyperammonemia 117 Increase lactulose  to TID (previously BID) Resume rifaximin  and spironolactone  Goal 2-3 loose stools per day Improved but had recurrence on 10/8 Lactulose  increased to QID with improvement in cognition but not yet back to baseline   Cirrhosis MELD/MELD-Na score is 13/14, with a mortality rate of 6% Continue propranolol  Holding  diuretics (furosemide , spironolactone )   AKI, recurrent Baseline creatinine 0.8, presenting creatinine 1.15 Returned to baseline but had recurrence on 10/8 Responded to albumin  x 1 and again recovered to baseline Avoid nephrotoxic agents, dehydration, and hypotension   Non-anion gap metabolic acidosis Serum bicarb <20 periodically Continue to treat underlying conditions   Liver cirrhosis, NASH INR 1.2, T. bili 2.3 No reported overt bleeding Avoid hepatotoxic agents   Chronic hypotension Resume midodrine  with hold parameters (SBP >120) Midodrine  does not appear to be needed, as BP range has been 120s/60-70s   Hypothyroidism Resume levothyroxine    HLD Hold atorvastatin    Chronic anxiety/depression Resume venlafaxine    Obesity Body mass index is 37.49 kg/m.SABRA  Weight loss should be encouraged Outpatient PCP/bariatric medicine f/u  encouraged Significantly low or high BMI is associated with higher medical risk including morbidity and mortality    Generalized weakness PT OT assessment Fall precautions       Consultants: PT OT   Procedures: None   Antibiotics: None    30 Day Unplanned Readmission Risk Score    Flowsheet Row ED to Hosp-Admission (Current) from 10/08/2023 in [facility]  30 Day Unplanned Readmission Risk Score (%) 77.63 Filed at 10/20/2023 0801    This score is the patient's risk of an unplanned readmission within 30 days of being discharged (0 -100%). The score is based on dignosis, age, lab data, medications, orders, and past utilization.   Low:  0-14.9   Medium: 15-21.9   High: 22-29.9   Extreme: 30 and above           Subjective: More alert and awake today.  Still marginally confused.   Objective: Vitals:   10/20/23 0644 10/20/23 1205  BP: 129/64 (!) 156/116  Pulse: 73 97  Resp: 18 20  Temp: 98.5 F (36.9 C) 98.4 F (36.9 C)  SpO2: 100%     Intake/Output Summary (Last 24 hours) at 10/20/2023 1826 Last data filed at 10/20/2023 1700 Gross per 24 hour  Intake 600 ml  Output --  Net 600 ml   Filed Weights   10/08/23 1547  Weight: 93 kg    Exam:  General:  Appears calm and comfortable and is in NAD,awake and alert but mildly confused (and naked, unaware) Eyes:  normal lids, chronic L eye visual impairment ENT:  grossly normal hearing, lips & tongue, mmm Cardiovascular:  RRR. No LE edema.  Respiratory:   CTA bilaterally with no wheezes/rales/rhonchi.  Normal respiratory effort. Abdomen:  soft, NT, ND Skin:  no rash or induration seen  on limited exam Musculoskeletal:  grossly normal tone BUE/BLE, good ROM, no bony abnormality Psychiatric:  blunted mood and affect, speech appropriate Neurologic:  unable to effectively perform  Data Reviewed: I have reviewed the patient's lab results since admission.  Pertinent labs for  today include:   Glucose 116 CO2 18 BUN 24/Creatinine 0.9/GFR >60, back to baseline Albumin  3.0 AST 52/ALT 35/Bili 2.5 NH4 124 WBC 5.9 Hgb 11.9 Platelets 106    Family Communication: Son present  Mobility: PT/OT Consulted and are recommending - Home Health Pt10/06/2023 1651    Code Status: Full Code  Disposition: Status is: Inpatient Remains inpatient appropriate because: ongoing monitoring     Time spent: 50 minutes  Unresulted Labs (From admission, onward)     Start     Ordered   10/21/23 0500  CBC with Differential/Platelet  Tomorrow morning,   R        10/20/23 1826   10/21/23 0500  Comprehensive metabolic panel with GFR  Tomorrow morning,   R        10/20/23 1826   10/21/23 0500  Ammonia  Tomorrow morning,   R        10/20/23 1826             Author: Delon Herald, MD 10/20/2023 6:26 PM  For on call review [email or web address].

## 2023-10-21 DIAGNOSIS — K7682 Hepatic encephalopathy: Secondary | ICD-10-CM | POA: Diagnosis not present

## 2023-10-21 LAB — COMPREHENSIVE METABOLIC PANEL WITH GFR
ALT: 33 U/L (ref 0–44)
AST: 42 U/L — ABNORMAL HIGH (ref 15–41)
Albumin: 2.8 g/dL — ABNORMAL LOW (ref 3.5–5.0)
Alkaline Phosphatase: 103 U/L (ref 38–126)
Anion gap: 7 (ref 5–15)
BUN: 22 mg/dL (ref 8–23)
CO2: 18 mmol/L — ABNORMAL LOW (ref 22–32)
Calcium: 8.8 mg/dL — ABNORMAL LOW (ref 8.9–10.3)
Chloride: 117 mmol/L — ABNORMAL HIGH (ref 98–111)
Creatinine, Ser: 0.97 mg/dL (ref 0.44–1.00)
GFR, Estimated: >60 mL/min (ref >60)
Glucose, Bld: 121 mg/dL — ABNORMAL HIGH (ref 70–99)
Potassium: 4 mmol/L (ref 3.5–5.1)
Sodium: 142 mmol/L (ref 135–145)
Total Bilirubin: 2.3 mg/dL — ABNORMAL HIGH (ref 0.0–1.2)
Total Protein: 4.6 g/dL — ABNORMAL LOW (ref 6.5–8.1)

## 2023-10-21 LAB — CBC WITH DIFFERENTIAL/PLATELET
Abs Immature Granulocytes: 0.01 K/uL (ref 0.00–0.07)
Basophils Absolute: 0.1 K/uL (ref 0.0–0.1)
Basophils Relative: 1 %
Eosinophils Absolute: 0.3 K/uL (ref 0.0–0.5)
Eosinophils Relative: 6 %
HCT: 36.2 % (ref 36.0–46.0)
Hemoglobin: 11.2 g/dL — ABNORMAL LOW (ref 12.0–15.0)
Immature Granulocytes: 0 %
Lymphocytes Relative: 26 %
Lymphs Abs: 1.5 K/uL (ref 0.7–4.0)
MCH: 31.5 pg (ref 26.0–34.0)
MCHC: 30.9 g/dL (ref 30.0–36.0)
MCV: 101.7 fL — ABNORMAL HIGH (ref 80.0–100.0)
Monocytes Absolute: 0.6 K/uL (ref 0.1–1.0)
Monocytes Relative: 10 %
Neutro Abs: 3.2 K/uL (ref 1.7–7.7)
Neutrophils Relative %: 57 %
Platelets: 107 K/uL — ABNORMAL LOW (ref 150–400)
RBC: 3.56 MIL/uL — ABNORMAL LOW (ref 3.87–5.11)
RDW: 16.3 % — ABNORMAL HIGH (ref 11.5–15.5)
WBC: 5.7 K/uL (ref 4.0–10.5)
nRBC: 0 % (ref 0.0–0.2)

## 2023-10-21 LAB — AMMONIA: Ammonia: 116 umol/L — ABNORMAL HIGH (ref 9–35)

## 2023-10-21 MED ORDER — SPIRONOLACTONE 12.5 MG HALF TABLET
12.5000 mg | ORAL_TABLET | Freq: Every day | ORAL | Status: DC
  Administered 2023-10-21 – 2023-10-23 (×3): 12.5 mg via ORAL
  Filled 2023-10-21 (×3): qty 1

## 2023-10-21 NOTE — Plan of Care (Signed)
  Problem: Pain Managment: Goal: General experience of comfort will improve and/or be controlled Outcome: Progressing   Problem: Safety: Goal: Ability to remain free from injury will improve Outcome: Progressing   Problem: Skin Integrity: Goal: Risk for impaired skin integrity will decrease Outcome: Progressing

## 2023-10-21 NOTE — Plan of Care (Signed)
  Problem: Health Behavior/Discharge Planning: Goal: Ability to manage health-related needs will improve Outcome: Progressing   Problem: Clinical Measurements: Goal: Ability to maintain clinical measurements within normal limits will improve Outcome: Progressing Goal: Will remain free from infection Outcome: Progressing Goal: Diagnostic test results will improve Outcome: Progressing Goal: Respiratory complications will improve Outcome: Progressing Goal: Cardiovascular complication will be avoided Outcome: Progressing   Problem: Activity: Goal: Risk for activity intolerance will decrease Outcome: Progressing   Problem: Nutrition: Goal: Adequate nutrition will be maintained Outcome: Progressing   Problem: Coping: Goal: Level of anxiety will decrease Outcome: Progressing   Problem: Pain Managment: Goal: General experience of comfort will improve and/or be controlled Outcome: Progressing   Problem: Safety: Goal: Ability to remain free from injury will improve Outcome: Progressing   Problem: Skin Integrity: Goal: Risk for impaired skin integrity will decrease Outcome: Progressing

## 2023-10-21 NOTE — Progress Notes (Signed)
 Progress Note   Patient: [name] FMW:[id number] DOB: [birth year] DOA: 10/08/2023     13 DOS: the patient was seen and examined on 10/21/2023   Brief hospital course: 68yo with h/o NASH cirrhosis with encephalopathy, short gut syndrome with chronic diarrhea, stage 3a CKD with anemia, hypothyroidism, anxiety/depression, chronic hypotension, and HLD who presented on 9/28 with AMS.  Elevated NH4, treated with lactulose  for hepatic encephalopathy.  Worsened after she was given narcotics, recurrent elevated NH4 now improving.  Assessment and Plan:  Hepatic encephalopathy Patient with known cryptogenic/NASH cirrhosis which has been decompensated with encephalopathy Presented with confusion and hyperammonemia 117 Increased lactulose  to QID (previously BID -> TID) due to recurrent enceohakopathy Resume rifaximin  and spironolactone  Goal 2-3 loose stools per day Improved but had recurrence on 10/8 NH4 remains elevated Given prolonged and refractory condition, will consult GI (Dr. Burnette notified by Secure Chat)   Cirrhosis MELD/MELD-Na score is 13/14, with a mortality rate of 6% Continue propranolol  Holding diuretics (furosemide , spironolactone ) in the setting of recent AKI, will restart aldactone  at 12.5 mg daily   AKI, recurrent Baseline creatinine 0.8, presenting creatinine 1.15 Returned to baseline but had recurrence on 10/8 Responded to albumin  x 1 and again recovered to baseline Avoid nephrotoxic agents, dehydration, and hypotension Monitor closely while restarting spironolactone  12.5 mg daily   Non-anion gap metabolic acidosis Serum bicarb <20 periodically Continue to treat underlying conditions   Liver cirrhosis, NASH INR 1.2, T. bili 2.3 No reported overt bleeding Avoid hepatotoxic agents   Chronic hypotension Resume midodrine  with hold parameters (SBP >120) Midodrine  does not appear to be needed, as BP range has been 120s/60-70s   Hypothyroidism Resume  levothyroxine    HLD Hold atorvastatin    Chronic anxiety/depression Resume venlafaxine    Obesity Body mass index is 37.49 kg/m.SABRA  Weight loss should be encouraged Outpatient PCP/bariatric medicine f/u encouraged Significantly low or high BMI is associated with higher medical risk including morbidity and mortality    Generalized weakness PT OT assessment Fall precautions       Consultants: PT OT   Procedures: None   Antibiotics: None  30 Day Unplanned Readmission Risk Score    Flowsheet Row ED to Hosp-Admission (Current) from 10/08/2023 in [facility]  30 Day Unplanned Readmission Risk Score (%) 73.36 Filed at 10/21/2023 1200    This score is the patient's risk of an unplanned readmission within 30 days of being discharged (0 -100%). The score is based on dignosis, age, lab data, medications, orders, and past utilization.   Low:  0-14.9   Medium: 15-21.9   High: 22-29.9   Extreme: 30 and above           Subjective: Confused and more somnolent today.  Not very interactive.  Son not present.   Objective: Vitals:   10/21/23 0528 10/21/23 1405  BP: 131/68 (!) 148/68  Pulse: 78 78  Resp: 18 18  Temp: 98.2 F (36.8 C) 98.1 F (36.7 C)  SpO2: 100% 100%    Intake/Output Summary (Last 24 hours) at 10/21/2023 1546 Last data filed at 10/21/2023 1420 Gross per 24 hour  Intake 600 ml  Output --  Net 600 ml   Filed Weights   10/08/23 1547  Weight: 93 kg    Exam:  General:  Appears calm and comfortable and is in NAD, awake and less alert and more confused  Eyes:  normal lids, chronic L eye visual impairment ENT:  grossly normal hearing, lips & tongue, mmm  Cardiovascular:  RRR. No LE edema.  Respiratory:   CTA bilaterally with no wheezes/rales/rhonchi.  Normal respiratory effort. Abdomen:  soft, NT, ND Skin:  no rash or induration seen on limited exam Musculoskeletal:  grossly normal tone BUE/BLE, good ROM, no  bony abnormality Psychiatric:  blunted mood and affect, speech less appropriate today Neurologic:  unable to effectively perform   Data Reviewed: I have reviewed the patient's lab results since admission.  Pertinent labs for today include:   CO2 18 Glucose 121 Albumin  2.8 AST 42/ALT 33/Bili 2.3 NH4 116 WBC 5.7 Hgb 11.2 Platelets 107     Family Communication: None present today  Mobility: PT/OT Consulted and are recommending - Home Health Pt10/06/2023 1651    Code Status: Full Code   Disposition: Status is: Inpatient Remains inpatient appropriate because: ongoing monitoring     Time spent: 50 minutes  Unresulted Labs (From admission, onward)     Start     Ordered   Unscheduled  Comprehensive metabolic panel with GFR  Tomorrow morning,   R        10/21/23 1546   Unscheduled  CBC with Differential/Platelet  Tomorrow morning,   R        10/21/23 1546   Unscheduled  Protime-INR  Tomorrow morning,   R        10/21/23 1546   Unscheduled  Ammonia  Tomorrow morning,   R        10/21/23 1546             Author: Delon Herald, MD 10/21/2023 3:46 PM  For on call review [email or web address].

## 2023-10-22 LAB — PROTIME-INR
INR: 1.4 — ABNORMAL HIGH (ref 0.8–1.2)
Prothrombin Time: 17.5 s — ABNORMAL HIGH (ref 11.4–15.2)

## 2023-10-22 LAB — COMPREHENSIVE METABOLIC PANEL WITH GFR
ALT: 35 U/L (ref 0–44)
AST: 42 U/L — ABNORMAL HIGH (ref 15–41)
Albumin: 3 g/dL — ABNORMAL LOW (ref 3.5–5.0)
Alkaline Phosphatase: 119 U/L (ref 38–126)
Anion gap: 8 (ref 5–15)
BUN: 20 mg/dL (ref 8–23)
CO2: 17 mmol/L — ABNORMAL LOW (ref 22–32)
Calcium: 9 mg/dL (ref 8.9–10.3)
Chloride: 116 mmol/L — ABNORMAL HIGH (ref 98–111)
Creatinine, Ser: 0.98 mg/dL (ref 0.44–1.00)
GFR, Estimated: >60 mL/min (ref >60)
Glucose, Bld: 122 mg/dL — ABNORMAL HIGH (ref 70–99)
Potassium: 4.1 mmol/L (ref 3.5–5.1)
Sodium: 141 mmol/L (ref 135–145)
Total Bilirubin: 1.7 mg/dL — ABNORMAL HIGH (ref 0.0–1.2)
Total Protein: 5.1 g/dL — ABNORMAL LOW (ref 6.5–8.1)

## 2023-10-22 LAB — CBC WITH DIFFERENTIAL/PLATELET
Abs Immature Granulocytes: 0.01 K/uL (ref 0.00–0.07)
Basophils Absolute: 0 K/uL (ref 0.0–0.1)
Basophils Relative: 1 %
Eosinophils Absolute: 0.4 K/uL (ref 0.0–0.5)
Eosinophils Relative: 6 %
HCT: 40 % (ref 36.0–46.0)
Hemoglobin: 12.4 g/dL (ref 12.0–15.0)
Immature Granulocytes: 0 %
Lymphocytes Relative: 31 %
Lymphs Abs: 1.9 K/uL (ref 0.7–4.0)
MCH: 31.6 pg (ref 26.0–34.0)
MCHC: 31 g/dL (ref 30.0–36.0)
MCV: 101.8 fL — ABNORMAL HIGH (ref 80.0–100.0)
Monocytes Absolute: 0.5 K/uL (ref 0.1–1.0)
Monocytes Relative: 9 %
Neutro Abs: 3.2 K/uL (ref 1.7–7.7)
Neutrophils Relative %: 53 %
Platelets: 129 K/uL — ABNORMAL LOW (ref 150–400)
RBC: 3.93 MIL/uL (ref 3.87–5.11)
RDW: 16.1 % — ABNORMAL HIGH (ref 11.5–15.5)
WBC: 6 K/uL (ref 4.0–10.5)
nRBC: 0 % (ref 0.0–0.2)

## 2023-10-22 LAB — AMMONIA: Ammonia: 92 umol/L — ABNORMAL HIGH (ref 9–35)

## 2023-10-22 NOTE — Plan of Care (Signed)
  Problem: Pain Managment: Goal: General experience of comfort will improve and/or be controlled Outcome: Progressing   Problem: Safety: Goal: Ability to remain free from injury will improve Outcome: Progressing   Problem: Skin Integrity: Goal: Risk for impaired skin integrity will decrease Outcome: Progressing

## 2023-10-22 NOTE — Plan of Care (Signed)
  Problem: Health Behavior/Discharge Planning: Goal: Ability to manage health-related needs will improve Outcome: Progressing   Problem: Clinical Measurements: Goal: Ability to maintain clinical measurements within normal limits will improve Outcome: Progressing Goal: Will remain free from infection Outcome: Progressing Goal: Diagnostic test results will improve Outcome: Progressing Goal: Respiratory complications will improve Outcome: Progressing Goal: Cardiovascular complication will be avoided Outcome: Progressing   Problem: Activity: Goal: Risk for activity intolerance will decrease Outcome: Progressing   Problem: Nutrition: Goal: Adequate nutrition will be maintained Outcome: Progressing   Problem: Coping: Goal: Level of anxiety will decrease Outcome: Progressing   Problem: Pain Managment: Goal: General experience of comfort will improve and/or be controlled Outcome: Progressing   Problem: Safety: Goal: Ability to remain free from injury will improve Outcome: Progressing   Problem: Skin Integrity: Goal: Risk for impaired skin integrity will decrease Outcome: Progressing

## 2023-10-22 NOTE — Progress Notes (Signed)
 Progress Note   Patient: [name] FMW:[id number] DOB: 1955-08-29 DOA: 10/08/2023     14 DOS: the patient was seen and examined on 10/22/2023   Brief hospital course: 68yo with h/o NASH cirrhosis with encephalopathy, short gut syndrome with chronic diarrhea, stage 3a CKD with anemia, hypothyroidism, anxiety/depression, chronic hypotension, and HLD who presented on 9/28 with AMS.  Elevated NH4, treated with lactulose  for hepatic encephalopathy.  Worsened after she was given narcotics, recurrent elevated NH4 now improving.  Assessment and Plan:  Hepatic encephalopathy Patient with known cryptogenic/NASH cirrhosis which has been decompensated with encephalopathy Presented with confusion and hyperammonemia 117 Increased lactulose  to QID (previously BID -> TID) due to recurrent enceohakopathy Resume rifaximin  and spironolactone  Goal 2-3 loose stools per day Improved but had recurrence on 10/8 NH4 remains elevated Given prolonged and refractory condition, will consult GI (Dr. Burnette notified by Secure Chat)   Cirrhosis MELD/MELD-Na score is 13/14, with a mortality rate of 6% Continue propranolol  Holding diuretics (furosemide , spironolactone ) in the setting of recent AKI, will restart aldactone  at 12.5 mg daily   AKI, recurrent Baseline creatinine 0.8, presenting creatinine 1.15 Returned to baseline but had recurrence on 10/8 Responded to albumin  x 1 and again recovered to baseline Avoid nephrotoxic agents, dehydration, and hypotension Monitor closely while restarting spironolactone  12.5 mg daily   Non-anion gap metabolic acidosis Serum bicarb <20 periodically Continue to treat underlying conditions   Liver cirrhosis, NASH INR 1.2, T. bili 2.3 No reported overt bleeding Avoid hepatotoxic agents   Chronic hypotension Resume midodrine  with hold parameters (SBP >120) Midodrine  does not appear to be needed, as BP range has been 120s/60-70s   Hypothyroidism Resume  levothyroxine    HLD Hold atorvastatin    Chronic anxiety/depression Resume venlafaxine    Obesity Body mass index is 37.49 kg/m.SABRA  Weight loss should be encouraged Outpatient PCP/bariatric medicine f/u encouraged Significantly low or high BMI is associated with higher medical risk including morbidity and mortality    Generalized weakness PT OT assessment Fall precautions       Consultants: PT OT   Procedures: None   Antibiotics: None  30 Day Unplanned Readmission Risk Score    Flowsheet Row ED to Hosp-Admission (Current) from 10/08/2023 in [facility]  30 Day Unplanned Readmission Risk Score (%) 73.83 Filed at 10/22/2023 0401    This score is the patient's risk of an unplanned readmission within 30 days of being discharged (0 -100%). The score is based on dignosis, age, lab data, medications, orders, and past utilization.   Low:  0-14.9   Medium: 15-21.9   High: 22-29.9   Extreme: 30 and above           Subjective: Finally better today!  Her son reports that she is about 80% better and he would like to monitor her here for one more day.   Objective: Vitals:   10/22/23 0541 10/22/23 1430  BP: 125/62 (!) 152/82  Pulse: 72 92  Resp: 18 18  Temp: 98.2 F (36.8 C) 98.4 F (36.9 C)  SpO2: 100% 99%   No intake or output data in the 24 hours ending 10/22/23 1521  Filed Weights   10/08/23 1547  Weight: 93 kg    Exam:  General:  Appears calm and comfortable and is in NAD, awake, alert, and minimally confused  Eyes:  normal lids, chronic L eye visual impairment ENT:  grossly normal hearing, lips & tongue, mmm Cardiovascular:  RRR. No LE edema.  Respiratory:  CTA bilaterally with no wheezes/rales/rhonchi.  Normal respiratory effort. Abdomen:  soft, NT, ND Skin:  no rash or induration seen on limited exam Musculoskeletal:  grossly normal tone BUE/BLE, good ROM, no bony abnormality Psychiatric:  blunted mood and  affect, speech much more appropriate today Neurologic:  no gross abnormalities on limited exam  Data Reviewed: I have reviewed the patient's lab results since admission.  Pertinent labs for today include:   CO2 17 Glucose 122 Albumin  3.0 AST 42/ALT 35/Bili 1.7 NH4 92 WBC 6 Platelets 129 INR 1.4     Family Communication: Son was present  Mobility: PT/OT Consulted and are recommending - Home Health Pt10/06/2023 1651    Code Status: Full Code   Disposition: Status is: Inpatient Remains inpatient appropriate because: ongoing monitoring, anticipate dc on 10/13     Time spent: 50 minutes  Unresulted Labs (From admission, onward)    None        Author: Delon Herald, MD 10/22/2023 3:21 PM  For on call review [email or web address].

## 2023-10-22 NOTE — Consult Note (Signed)
 [facility] Consultation Note  Referring Provider: [facility] Primary Care Physician:  [name], MD Primary Gastroenterologist:  Sampson  Reason for Consultation:  confusion  HPI: [name] is a 68 y.o. female multiple medical problems including cirrhosis with hepatic encephalopathy.  Was called to see patient for refractory confusion.  Upon entry into hospital room, patient's son tells me his mother is back to baseline.  She is able to answer questions clearly and lucidly.   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   Hyperammonemia 09/30/2022   Hypertension    Kidney stone    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   Obesity    Pulmonary emboli (HCC) 01/10/2009   Pulmonary embolism (HCC)    Short gut syndrome    Thyroid  disease     Past Surgical History:  Procedure Laterality Date   ABDOMINAL WALL MESH  REMOVAL     2016   CESAREAN SECTION WITH BILATERAL TUBAL LIGATION  04/30/1985   CHOLECYSTECTOMY  05/2007   Exploratory laparotomy, lysis of adhesions, takedown of ileostomy with small bowel resection, open cholecystectomy and ileocolostomy   COLON SURGERY  01/2007   exploratory laparotomy with right colecotmy and end ileostomy   COLONOSCOPY  04/02/2019   Dr Gunnar Chris Daniels, MD [facility]   ESOPHAGOGASTRODUODENOSCOPY (EGD) WITH PROPOFOL  N/A 10/19/2022   Procedure: ESOPHAGOGASTRODUODENOSCOPY (EGD) WITH PROPOFOL ;  Surgeon:  Saintclair Jasper, MD;  Location: [facility];  Service: Gastroenterology;  Laterality: N/A;   ESOPHAGOSCOPY  04/02/2019   EYE SURGERY Left 10/06/2020   Cataract extraciton w/intraocular lens implant- Dr Caresse   HERNIA REPAIR  10/2007   ILEOSTOMY  2009   states bowel perfotation wih colonsocopy   ILEOSTOMY CLOSURE  2009   INCISIONAL HERNIA REPAIR  10/11/2018   IR KYPHO THORACIC WITH BONE BIOPSY  08/31/2022   IR KYPHO THORACIC WITH BONE BIOPSY  10/27/2022   IR RADIOLOGIST EVAL & MGMT  09/27/2022   KIDNEY STONE SURGERY     LIVER RESECTION     2009   PANNICULECTOMY     repair of abdominal wall hernia   WRIST SURGERY     tendonitis    Prior to Admission medications   Medication Sig Start Date End Date Taking? Authorizing Provider  albuterol  (VENTOLIN  HFA) 108 (90 Base) MCG/ACT inhaler Inhale 2 puffs into the lungs every 6 (six) hours as needed for shortness of breath or wheezing. 02/13/23  Yes [provider]  atorvastatin  (LIPITOR) 40 MG tablet TAKE 1 TABLET(40 MG) BY MOUTH IN THE MORNING 09/18/23  Yes [name], MD  copper  tablet Take 2 mg by mouth in the morning and at bedtime.   Yes [provider]  cyanocobalamin  (VITAMIN B12) 1000 MCG tablet Take 2,000 mcg by mouth every Monday, Wednesday, and Friday.   Yes [provider]  dicyclomine  (BENTYL ) 10 MG capsule Take 1 capsule (10 mg total) by mouth 3 (three) times daily as needed for spasms (abdominal cramping).  08/07/23  Yes [name], MD  EPINEPHrine  0.3 mg/0.3 mL IJ SOAJ injection Inject 0.3 mg into the muscle as needed for anaphylaxis. 07/27/23  Yes [name], MD  famotidine  (PEPCID ) 20 MG tablet Take 20 mg by mouth daily as needed for heartburn or indigestion.   Yes [provider]  furosemide  (LASIX ) 40 MG tablet Take 1 tablet (40 mg total) by mouth 2 (two) times daily. Patient taking differently: Take 80 mg by mouth in the morning. 10/06/23  Yes [name], MD   HYDROcodone -acetaminophen  (NORCO) 10-325 MG tablet Take 1 tablet by mouth every 8 (eight) hours as needed (for pain). 06/15/23  Yes [provider]  IRON -VITAMIN C  PO Take 1 tablet by mouth daily with breakfast.   Yes [provider]  lactulose  (CHRONULAC ) 10 GM/15ML solution Take 45 mLs (30 g total) by mouth 3 (three) times daily. Take to ensure that there is 2 loose bowel movement EVERY DAY Patient taking differently: Take 30 g by mouth 2 (two) times daily. 05/29/23  Yes Cheryle Page, MD  levothyroxine  (SYNTHROID ) 175 MCG tablet TAKE 1 TABLET BY MOUTH DAILY A 6AM. RECHECK TSH WITH PCP IN 4 WEEKS Patient taking differently: Take 175 mcg by mouth daily before breakfast. 09/20/23  Yes [name], MD  magnesium  gluconate (MAGONATE) 500 (27 Mg) MG TABS tablet Take 1 tablet (500 mg total) by mouth in the morning and at bedtime. Patient taking differently: Take 1,000 mg by mouth in the morning. 05/01/23  Yes [name], MD  megestrol  (MEGACE ) 40 MG tablet Take 1 tablet (40 mg total) by mouth 2 (two) times daily. Patient taking differently: Take 40 mg by mouth in the morning and at bedtime. 05/04/23  Yes Ajewole, [name], MD  methocarbamol  (ROBAXIN ) 500 MG tablet Take 500 mg by mouth in the morning.   Yes [provider]  midodrine  (PROAMATINE ) 10 MG tablet Take 1 tablet (10 mg total) by mouth 3 (three) times daily. 09/12/23  Yes [name], MD  Multiple Vitamins-Minerals (CENTRUM SILVER ULTRA WOMENS PO) Take 1 tablet by mouth in the morning.   Yes [provider]  naloxone (NARCAN) nasal spray 4 mg/0.1 mL Place 1 spray into the nose as needed (accidental overdose). 03/25/22  Yes [provider]  nystatin  cream (MYCOSTATIN ) Apply 1 Application topically 2 (two) times daily as needed for dry skin. 05/29/23  Yes Cheryle Page, MD  pantoprazole  (PROTONIX ) 40 MG tablet Take 1 tablet (40 mg total) by mouth 2 (two) times daily before a meal. 06/09/23  Yes  [name], MD  potassium chloride  (KLOR-CON ) 10 MEQ tablet Take 2 tablets (20 mEq total) by mouth 2 (two) times daily. Patient taking differently: Take 20 mEq by mouth in the morning and at bedtime. 08/07/23  Yes [name], MD  rifaximin  (XIFAXAN ) 550 MG TABS tablet Take 550 mg by mouth 2 (two) times daily.   Yes [provider]  rOPINIRole  (REQUIP ) 0.25 MG tablet TAKE 1 TABLET(0.25 MG) BY MOUTH AT BEDTIME 06/10/23  Yes [name], MD  senna-docusate (SENOKOT-S) 8.6-50 MG tablet Take 1 tablet by mouth 2 (two) times daily. 09/12/23  Yes [name], MD  triamcinolone  cream (KENALOG ) 0.1 % Apply 1 Application topically 2 (two) times daily as needed (for itching- affected areas).   Yes [provider]  venlafaxine  XR (EFFEXOR -XR) 75 MG 24 hr capsule Take 1 capsule (75 mg total) by mouth daily with breakfast. 07/20/23  Yes [name], MD  Vitamin D , Ergocalciferol , (DRISDOL) 50000 UNITS  CAPS Take 50,000 Units by mouth every Wednesday.   Yes [provider]  spironolactone  (ALDACTONE ) 50 MG tablet TAKE 1 TABLET(50 MG) BY MOUTH DAILY 10/16/23   [name], MD    Current Facility-Administered Medications  Medication Dose Route Frequency Provider Last Rate Last Admin   acetaminophen  (TYLENOL ) tablet 500 mg  500 mg Oral Q6H PRN Barbarann Nest, MD   500 mg at 10/20/23 1056   albuterol  (PROVENTIL ) (2.5 MG/3ML) 0.083% nebulizer solution 2.5 mg  2.5 mg Nebulization Q6H PRN Shona Laurence N, DO       bisacodyl (DULCOLAX) EC tablet 5-10 mg  5-10 mg Oral QHS Von Bellis, MD   5 mg at 10/20/23 2151   heparin  injection 5,000 Units  5,000 Units Subcutaneous Q8H Al-Sultani, Anmar, MD   5,000 Units at 10/22/23 9367   lactulose  (CHRONULAC ) 10 GM/15ML solution 30 g  30 g Oral BID PRN Barbarann Nest, MD       lactulose  (CHRONULAC ) 10 GM/15ML solution 30 g  30 g Oral QID Al-Sultani, Anmar, MD   30 g at 10/22/23 1022   levothyroxine  (SYNTHROID ) tablet 175 mcg  175 mcg  Oral Q0600 Shona Laurence SAILOR, DO   175 mcg at 10/22/23 9367   lidocaine  (LIDODERM ) 5 % 1 patch  1 patch Transdermal Daily Barbarann Nest, MD   1 patch at 10/22/23 1022   lidocaine  (LIDODERM ) 5 % 1 patch  1 patch Transdermal Q24H Regalado, Belkys A, MD   1 patch at 10/21/23 1417   megestrol  (MEGACE ) tablet 40 mg  40 mg Oral BID Regalado, Belkys A, MD   40 mg at 10/22/23 1024   multivitamin with minerals tablet 1 tablet  1 tablet Oral Daily Shona Laurence N, DO   1 tablet at 10/22/23 1023   Oral care mouth rinse  15 mL Mouth Rinse PRN Von Bellis, MD       oxyCODONE  (Oxy IR/ROXICODONE ) immediate release tablet 2.5 mg  2.5 mg Oral BID PRN Regalado, Belkys A, MD   2.5 mg at 10/21/23 2237   pantoprazole  (PROTONIX ) EC tablet 40 mg  40 mg Oral BID Leotis Bogus, MD   40 mg at 10/22/23 1023   polyethylene glycol (MIRALAX  / GLYCOLAX ) packet 17 g  17 g Oral Daily PRN Shona Laurence SAILOR, DO       potassium chloride  SA (KLOR-CON  M) CR tablet 20 mEq  20 mEq Oral BID Leotis Bogus, MD   20 mEq at 10/22/23 1023   prochlorperazine  (COMPAZINE ) injection 5 mg  5 mg Intravenous Q6H PRN Shona Laurence N, DO       rifaximin  (XIFAXAN ) tablet 550 mg  550 mg Oral BID Shona Laurence N, DO   550 mg at 10/22/23 1023   rOPINIRole  (REQUIP ) tablet 0.25 mg  0.25 mg Oral QHS Leotis Bogus, MD   0.25 mg at 10/21/23 2200   senna-docusate (Senokot-S) tablet 1 tablet  1 tablet Oral BID Shona Laurence N, DO   1 tablet at 10/22/23 1023   spironolactone  (ALDACTONE ) tablet 12.5 mg  12.5 mg Oral Daily Barbarann Nest, MD   12.5 mg at 10/22/23 1023   venlafaxine  XR (EFFEXOR -XR) 24 hr capsule 75 mg  75 mg Oral Q breakfast Shona Laurence N, DO   75 mg at 10/22/23 1023   Vitamin D  (Ergocalciferol ) (DRISDOL) 1.25 MG (50000 UNIT) capsule 50,000 Units  50,000 Units Oral Q Stevan Leotis, Bogus, MD   50,000 Units at 10/18/23 1040    Allergies as of 10/08/2023 - Review  Complete 10/08/2023  Allergen Reaction Noted   Ace inhibitors Anaphylaxis and Swelling  06/05/2011   Gadolinium derivatives Anaphylaxis 11/30/2022   Ivp dye [iodinated contrast media] Anaphylaxis 06/05/2011   Wound dressing adhesive Dermatitis 11/10/2016   Desitin [zinc  oxide] Rash and Other (See Comments) 08/21/2022   Yellow jacket venom Swelling 10/21/2019   Chlorhexidine  Rash and Other (See Comments) 05/02/2022   Doxycycline Rash 07/05/2015   Penicillins Dermatitis and Rash 06/05/2011   Zofran  [ondansetron ] Rash 07/05/2015    Family History  Problem Relation Age of Onset   Uterine cancer Mother    Bone cancer Father    Heart disease Father    Hyperlipidemia Father    Dementia Father    Hypertension Father    Skin cancer Sister    Hypertension Sister    Diabetes Sister    Hyperlipidemia Brother    Hypertension Brother    Heart disease Brother     Social History   Socioeconomic History   Marital status: Single    Spouse name: Not on file   Number of children: 1   Years of education: Not on file   Highest education level: Not on file  Occupational History   Not on file  Tobacco Use   Smoking status: Former    Current packs/day: 0.00    Types: Cigarettes    Quit date: 1987    Years since quitting: 38.8    Passive exposure: Past   Smokeless tobacco: Never  Vaping Use   Vaping status: Never Used  Substance and Sexual Activity   Alcohol use: No   Drug use: No   Sexual activity: Not Currently    Birth control/protection: Post-menopausal  Other Topics Concern   Not on file  Social History Narrative   Not on file   Social Drivers of Health   Financial Resource Strain: Low Risk  (09/20/2023)   Overall Financial Resource Strain (CARDIA)    Difficulty of Paying Living Expenses: Not hard at all  Food Insecurity: No Food Insecurity (10/09/2023)   Hunger Vital Sign    Worried About Running Out of Food in the Last Year: Never true    Ran Out of Food in the Last Year: Never true  Transportation Needs: No Transportation Needs (10/09/2023)   PRAPARE -  Administrator, Civil Service (Medical): No    Lack of Transportation (Non-Medical): No  Physical Activity: Inactive (09/20/2023)   Exercise Vital Sign    Days of Exercise per Week: 0 days    Minutes of Exercise per Session: 0 min  Stress: No Stress Concern Present (09/20/2023)   [employer] of Occupational Health - Occupational Stress Questionnaire    Feeling of Stress: Only a little  Social Connections: Unknown (10/09/2023)   Social Connection and Isolation Panel    Frequency of Communication with Friends and Family: More than three times a week    Frequency of Social Gatherings with Friends and Family: Three times a week    Attends Religious Services: Never    Active Member of Clubs or Organizations: No    Attends Banker Meetings: Never    Marital Status: Patient declined  Intimate Partner Violence: Not At Risk (10/09/2023)   Humiliation, Afraid, Rape, and Kick questionnaire    Fear of Current or Ex-Partner: No    Emotionally Abused: No    Physically Abused: No    Sexually Abused: No    Review of Systems: As per HPI, all others negative  Physical  Exam: Vital signs in last 24 hours: Temp:  [98.1 F (36.7 C)-98.7 F (37.1 C)] 98.2 F (36.8 C) (10/12 0541) Pulse Rate:  [72-78] 72 (10/12 0541) Resp:  [18] 18 (10/12 0541) BP: (123-148)/(62-73) 125/62 (10/12 0541) SpO2:  [85 %-100 %] 100 % (10/12 0541) Last BM Date : 10/21/23 General:   Alert,  overweight, chronically ill-appearing, much older-appearing than stated age, cooperative in NAD Head:  Normocephalic and atraumatic. Eyes:  Sclera clear, no icterus.   Conjunctiva pink. Ears:  Normal auditory acuity. Nose:  No deformity, discharge,  or lesions. Mouth:  No deformity or lesions.  Oropharynx pink & moist. Neck:  Supple; no masses or thyromegaly. Lungs:  No visible respiratory distress Abdomen:  Soft, nontender and nondistended. No masses, hepatosplenomegaly or hernias noted. Msk:   Symmetrical without gross deformities. Normal posture. Pulses:  Normal pulses noted. Extremities:  Without clubbing or edema. Neurologic:  Alert and  oriented x4;  grossly normal neurologically. Skin:  scattered ecchymoses; otherwise Intact without significant lesions or rashes. Psych:  Alert and cooperative. Normal mood and affect.   Lab Results: Recent Labs    10/20/23 0423 10/21/23 0426 10/22/23 0520  WBC 5.9 5.7 6.0  HGB 11.9* 11.2* 12.4  HCT 38.3 36.2 40.0  PLT 106* 107* 129*   BMET Recent Labs    10/20/23 0423 10/21/23 0426 10/22/23 0520  NA 143 142 141  K 3.9 4.0 4.1  CL 116* 117* 116*  CO2 18* 18* 17*  GLUCOSE 118* 121* 122*  BUN 24* 22 20  CREATININE 0.90 0.97 0.98  CALCIUM  8.7* 8.8* 9.0   LFT Recent Labs    10/22/23 0520  PROT 5.1*  ALBUMIN  3.0*  AST 42*  ALT 35  ALKPHOS 119  BILITOT 1.7*   PT/INR Recent Labs    10/22/23 0520  LABPROT 17.5*  INR 1.4*    Studies/Results: No results found.  Impression:   Cirrhosis, managed by [facility]. Confusion.  Son reports she is back to her baseline as of this morning. Multiple medical problems.  Plan:   Lactulose  30 g po tid, now and indefinitely upon discharge. Rifaximin  550 mg po bid, now and indefinitely upon discharge. Once a diagnosis of hepatic encephalopathy is made, there is no utility in serial ammonia levels, as there is no correlation of these labs to severity or duration or even presence/absence of illness. No GI input needed otherwise, as mental status back to baseline.  Patient should follow-up with her primary hepatologist upon disease. [facility] will sign-off; please call with questions; thank you for the consultation.   LOS: 14 days   Jaystin Mcgarvey M  10/22/2023, 11:42 AM  Cell [phone] If no answer or after 5 PM call [phone]

## 2023-10-23 DIAGNOSIS — N179 Acute kidney failure, unspecified: Secondary | ICD-10-CM | POA: Diagnosis present

## 2023-10-23 DIAGNOSIS — K7682 Hepatic encephalopathy: Secondary | ICD-10-CM | POA: Diagnosis not present

## 2023-10-23 LAB — CBC WITH DIFFERENTIAL/PLATELET
Abs Immature Granulocytes: 0.01 K/uL (ref 0.00–0.07)
Basophils Absolute: 0 K/uL (ref 0.0–0.1)
Basophils Relative: 1 %
Eosinophils Absolute: 0.3 K/uL (ref 0.0–0.5)
Eosinophils Relative: 6 %
HCT: 38.2 % (ref 36.0–46.0)
Hemoglobin: 11.5 g/dL — ABNORMAL LOW (ref 12.0–15.0)
Immature Granulocytes: 0 %
Lymphocytes Relative: 26 %
Lymphs Abs: 1.3 K/uL (ref 0.7–4.0)
MCH: 31 pg (ref 26.0–34.0)
MCHC: 30.1 g/dL (ref 30.0–36.0)
MCV: 103 fL — ABNORMAL HIGH (ref 80.0–100.0)
Monocytes Absolute: 0.5 K/uL (ref 0.1–1.0)
Monocytes Relative: 10 %
Neutro Abs: 3 K/uL (ref 1.7–7.7)
Neutrophils Relative %: 57 %
Platelets: 121 K/uL — ABNORMAL LOW (ref 150–400)
RBC: 3.71 MIL/uL — ABNORMAL LOW (ref 3.87–5.11)
RDW: 16 % — ABNORMAL HIGH (ref 11.5–15.5)
WBC: 5.2 K/uL (ref 4.0–10.5)
nRBC: 0 % (ref 0.0–0.2)

## 2023-10-23 LAB — COMPREHENSIVE METABOLIC PANEL WITH GFR
ALT: 32 U/L (ref 0–44)
AST: 33 U/L (ref 15–41)
Albumin: 2.8 g/dL — ABNORMAL LOW (ref 3.5–5.0)
Alkaline Phosphatase: 111 U/L (ref 38–126)
Anion gap: 8 (ref 5–15)
BUN: 19 mg/dL (ref 8–23)
CO2: 16 mmol/L — ABNORMAL LOW (ref 22–32)
Calcium: 8.9 mg/dL (ref 8.9–10.3)
Chloride: 116 mmol/L — ABNORMAL HIGH (ref 98–111)
Creatinine, Ser: 0.83 mg/dL (ref 0.44–1.00)
GFR, Estimated: >60 mL/min (ref >60)
Glucose, Bld: 103 mg/dL — ABNORMAL HIGH (ref 70–99)
Potassium: 3.9 mmol/L (ref 3.5–5.1)
Sodium: 140 mmol/L (ref 135–145)
Total Bilirubin: 1.4 mg/dL — ABNORMAL HIGH (ref 0.0–1.2)
Total Protein: 4.7 g/dL — ABNORMAL LOW (ref 6.5–8.1)

## 2023-10-23 MED ORDER — SODIUM BICARBONATE 650 MG PO TABS: 650.0000 mg | ORAL_TABLET | Freq: Three times a day (TID) | ORAL | 0 refills | Status: AC

## 2023-10-23 MED ORDER — SODIUM BICARBONATE 650 MG PO TABS
650.0000 mg | ORAL_TABLET | Freq: Three times a day (TID) | ORAL | Status: DC
  Administered 2023-10-23: 650 mg via ORAL
  Filled 2023-10-23 (×2): qty 1

## 2023-10-23 MED ORDER — LACTULOSE 10 GM/15ML PO SOLN: 30.0000 g | Freq: Four times a day (QID) | ORAL | 0 refills | Status: DC

## 2023-10-23 MED ORDER — SPIRONOLACTONE 25 MG PO TABS: 12.5000 mg | ORAL_TABLET | Freq: Every day | ORAL | 0 refills | Status: DC

## 2023-10-23 NOTE — Discharge Summary (Addendum)
 Physician Discharge Summary   Patient: [name] MRN: [id number] DOB: 1955/09/11  Admit date:     10/08/2023  Discharge date: 10/23/23  Discharge Physician: Delon Herald   PCP: [name], MD   Recommendations at discharge:   You are being discharged with home health physical and occupational therapy as well as nurse and aide Use lactulose  4 times daily Take sodium bicarbonate  3 times a day for 3 days Hold midodrine , as it has not been needed Follow up with Dr. 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  Elevated NH4, treated with lactulose  for hepatic encephalopathy.  Worsened after she was given narcotics, recurrent elevated NH4 now improving.  Assessment and Plan:  Hepatic encephalopathy Patient with known cryptogenic/NASH cirrhosis which has been decompensated with encephalopathy Presented with confusion and hyperammonemia 117 Increased lactulose  to QID (previously BID -> TID) due to recurrent encephalopathy Resume rifaximin  and spironolactone  Goal 2-3 loose stools per day Improved but had recurrence on 10/8 NH4 remains elevated Given prolonged and refractory condition, consulted GI Finally improved back to better than baseline and ready for dc   Cirrhosis MELD/MELD-Na score is 13/14, with a mortality rate of 6% Continue propranolol, Rifaximin   Held diuretics  (furosemide , spironolactone ) in the setting of recent AKI Restarted aldactone  at 12.5 mg daily Resume Lasix  at time of dc   AKI, recurrent Baseline creatinine 0.8, presenting creatinine 1.15 Returned to baseline but had recurrence on 10/8 Responded to albumin  x 1 and again recovered to baseline Avoid nephrotoxic agents, dehydration, and hypotension Resolved as of now   Non-anion gap metabolic acidosis Serum bicarb <20 periodically Continue to treat underlying conditions Will add PO HCO3 TID x 3 days Will need PCP f/u later this week   Liver cirrhosis, NASH INR 1.2, T. bili 2.3 No reported overt bleeding Avoid hepatotoxic agents   Chronic hypotension Resume midodrine  with hold parameters (SBP >120) Midodrine  does not appear to be needed, as BP range has been 120s/60-70s   Hypothyroidism Resume levothyroxine    HLD Resume atorvastatin    Chronic anxiety/depression Resume venlafaxine    Obesity Body mass index is 37.49 kg/m.SABRA  Weight loss should be encouraged Outpatient PCP/bariatric medicine f/u encouraged Significantly low or high BMI is associated with higher medical risk including morbidity and mortality    Generalized weakness PT OT assessment Fall precautions       Consultants: PT OT   Procedures: None   Antibiotics: None   Pain control - [place]  Controlled Substance Reporting System database was reviewed. and patient was instructed, not to drive, operate heavy machinery, perform activities at heights, swimming or participation in water  activities or provide baby-sitting services while on Pain, Sleep and Anxiety Medications; until their outpatient Physician has advised to do so again. Also recommended to not to take more than prescribed Pain, Sleep and Anxiety Medications.   Disposition: Home Diet recommendation:  Regular diet DISCHARGE MEDICATION: Allergies as of 10/23/2023  Reactions   Ace Inhibitors Anaphylaxis, Swelling   Gadolinium  Derivatives Anaphylaxis   Ivp Dye [iodinated Contrast Media] Anaphylaxis   Wound Dressing Adhesive Dermatitis   Desitin [zinc  Oxide] Rash, Other (See Comments)   Worsens rash   Yellow Jacket Venom Swelling   Chlorhexidine  Rash, Other (See Comments)   WORSENS RASHES   Doxycycline Rash   Penicillins Dermatitis, Rash   Zofran  [ondansetron ] Rash        Medication List     STOP taking these medications    midodrine  10 MG tablet Commonly known as: PROAMATINE    senna-docusate 8.6-50 MG tablet Commonly known as: Senokot-S       TAKE these medications    albuterol  108 (90 Base) MCG/ACT inhaler Commonly known as: VENTOLIN  HFA Inhale 2 puffs into the lungs every 6 (six) hours as needed for shortness of breath or wheezing.   atorvastatin  40 MG tablet Commonly known as: LIPITOR TAKE 1 TABLET(40 MG) BY MOUTH IN THE MORNING   CENTRUM SILVER ULTRA WOMENS PO Take 1 tablet by mouth in the morning.   copper  tablet Take 2 mg by mouth in the morning and at bedtime.   cyanocobalamin  1000 MCG tablet Commonly known as: VITAMIN B12 Take 2,000 mcg by mouth every Monday, Wednesday, and Friday.   dicyclomine  10 MG capsule Commonly known as: BENTYL  Take 1 capsule (10 mg total) by mouth 3 (three) times daily as needed for spasms (abdominal cramping).   EPINEPHrine  0.3 mg/0.3 mL Soaj injection Commonly known as: EPI-PEN Inject 0.3 mg into the muscle as needed for anaphylaxis.   famotidine  20 MG tablet Commonly known as: PEPCID  Take 20 mg by mouth daily as needed for heartburn or indigestion.   furosemide  40 MG tablet Commonly known as: LASIX  Take 1 tablet (40 mg total) by mouth 2 (two) times daily. What changed:  how much to take when to take this   HYDROcodone -acetaminophen  10-325 MG tablet Commonly known as: NORCO Take 1 tablet by mouth every 8 (eight) hours as needed (for pain).   IRON -VITAMIN C  PO Take 1 tablet by mouth daily with breakfast.   lactulose  10 GM/15ML  solution Commonly known as: CHRONULAC  Take 45 mLs (30 g total) by mouth 4 (four) times daily. What changed:  when to take this additional instructions   levothyroxine  175 MCG tablet Commonly known as: SYNTHROID  TAKE 1 TABLET BY MOUTH DAILY A 6AM. RECHECK TSH WITH PCP IN 4 WEEKS What changed: See the new instructions.   magnesium  gluconate 500 (27 Mg) MG Tabs tablet Commonly known as: MAGONATE Take 1 tablet (500 mg total) by mouth in the morning and at bedtime. What changed:  how much to take when to take this   megestrol  40 MG tablet Commonly known as: MEGACE  Take 1 tablet (40 mg total) by mouth 2 (two) times daily. What changed: when to take this   methocarbamol  500 MG tablet Commonly known as: ROBAXIN  Take 500 mg by mouth in the morning.   naloxone 4 MG/0.1ML Liqd nasal spray kit Commonly known as: NARCAN Place 1 spray into the nose as needed (accidental overdose).   nystatin  cream Commonly known as: MYCOSTATIN  Apply 1 Application topically 2 (two) times daily as needed for dry skin.   pantoprazole  40 MG tablet Commonly known as: PROTONIX  Take 1 tablet (40 mg total) by mouth 2 (two) times daily before a meal.   potassium chloride  10 MEQ tablet Commonly known as: KLOR-CON  Take 2 tablets (20 mEq total) by mouth 2 (two) times  daily. What changed: when to take this   rifaximin  550 MG Tabs tablet Commonly known as: XIFAXAN  Take 550 mg by mouth 2 (two) times daily.   rOPINIRole  0.25 MG tablet Commonly known as: REQUIP  TAKE 1 TABLET(0.25 MG) BY MOUTH AT BEDTIME   sodium bicarbonate  650 MG tablet Take 1 tablet (650 mg total) by mouth 3 (three) times daily for 3 days.   spironolactone  25 MG tablet Commonly known as: ALDACTONE  Take 0.5 tablets (12.5 mg total) by mouth daily. Start taking on: October 24, 2023   triamcinolone  cream 0.1 % Commonly known as: KENALOG  Apply 1 Application topically 2 (two) times daily as needed (for itching- affected areas).    venlafaxine  XR 75 MG 24 hr capsule Commonly known as: EFFEXOR -XR Take 1 capsule (75 mg total) by mouth daily with breakfast.   Vitamin D  (Ergocalciferol ) 1.25 MG (50000 UNIT) Caps capsule Commonly known as: DRISDOL Take 50,000 Units by mouth every Wednesday.        Follow-up Information     [facility] Call on 10/11/2023.   Why: If interested, please call this provider to learn more about private aerobic swim lessons or classes. Contact information: Address: [address] [place], [place] [address]  Phone: [phone]        [facility] [place]. Call on 10/11/2023.   Why: If interested, please call this provider to learn more about aerobic swim and exercise classes. Contact information: Address: [address] [place], [place] [address]  Phone: [phone]        [facility]. Call on 10/11/2023.   Why: If interested, please call this provider to learn more about aerobic swim and exercise classes Contact information: Address: [address] [place], [place] [address]  Phone: [phone]        [facility] Follow up.   Why: Will call to set up appointments for Physical and Occupational Therapy, Nurse, and Aide Contact information: [address] [place] [address] [phone]         [name], MD. Schedule an appointment as soon as possible for a visit in 3 day(s).   Specialty: Family Medicine Contact information: [address] [place] [address] [phone]                Discharge Exam:    Subjective: Feeling great!  Ambulating with a walker and her son (contact guard) in the halls.   Objective: Vitals:   10/22/23 2026 10/23/23 0456  BP: (!) 114/90 122/60  Pulse: 70 78  Resp: 17 16  Temp: 98 F (36.7 C) 98 F (36.7 C)  SpO2: 100% 100%   No intake or output data in the 24 hours ending 10/23/23 1330 Filed Weights   10/08/23 1547   Weight: 93 kg    Exam:  General:  Appears calm and comfortable and is in NAD, awake, alert, confusion is resolved Eyes:  normal lids, chronic L eye visual impairment ENT:  grossly normal hearing, lips & tongue, mmm Cardiovascular:  RRR. No LE edema.  Respiratory:   CTA bilaterally with no wheezes/rales/rhonchi.  Normal respiratory effort. Abdomen:  soft, NT, ND Skin:  no rash or induration seen on limited exam Musculoskeletal:  grossly normal tone BUE/BLE, good ROM, no bony abnormality Psychiatric: grossly normal mood and affect, speech much more appropriate today Neurologic:  no gross abnormalities on limited exam  Data Reviewed: I have reviewed the patient's  lab results since admission.  Pertinent labs for today include:   CO2 16 Glucose 103 Albumin  2.8 Bili 1.4 WBC 5.2 Hgb 11.5 Platelets 121    Condition at discharge: improving  The results of significant diagnostics from this hospitalization (including imaging, microbiology, ancillary and laboratory) are listed below for reference.   Imaging Studies: No results found.  Microbiology: Results for orders placed or performed during the hospital encounter of 08/24/23  Resp panel by RT-PCR (RSV, Flu A&B, Covid) Urine, Clean Catch     Status: None   Collection Time: 08/24/23  5:07 PM   Specimen: Urine, Clean Catch; Nasal Swab  Result Value Ref Range Status   SARS Coronavirus 2 by RT PCR NEGATIVE NEGATIVE Final    Comment: (NOTE) SARS-CoV-2 target nucleic acids are NOT DETECTED.  The SARS-CoV-2 RNA is generally detectable in upper respiratory specimens during the acute phase of infection. The lowest concentration of SARS-CoV-2 viral copies this assay can detect is 138 copies/mL. A negative result does not preclude SARS-Cov-2 infection and should not be used as the sole basis for treatment or other patient management decisions. A negative result may occur with  improper specimen collection/handling, submission of  specimen other than nasopharyngeal swab, presence of viral mutation(s) within the areas targeted by this assay, and inadequate number of viral copies(<138 copies/mL). A negative result must be combined with clinical observations, patient history, and epidemiological information. The expected result is Negative.  Fact Sheet for Patients:  BloggerCourse.com  Fact Sheet for Healthcare Providers:  SeriousBroker.it  This test is no t yet approved or cleared by the [place]  FDA and  has been authorized for detection and/or diagnosis of SARS-CoV-2 by FDA under an Emergency Use Authorization (EUA). This EUA will remain  in effect (meaning this test can be used) for the duration of the COVID-19 declaration under Section 564(b)(1) of the Act, 21 U.S.C.section 360bbb-3(b)(1), unless the authorization is terminated  or revoked sooner.       Influenza A by PCR NEGATIVE NEGATIVE Final   Influenza B by PCR NEGATIVE NEGATIVE Final    Comment: (NOTE) The Xpert Xpress SARS-CoV-2/FLU/RSV plus assay is intended as an aid in the diagnosis of influenza from Nasopharyngeal swab specimens and should not be used as a sole basis for treatment. Nasal washings and aspirates are unacceptable for Xpert Xpress SARS-CoV-2/FLU/RSV testing.  Fact Sheet for Patients: BloggerCourse.com  Fact Sheet for Healthcare Providers: SeriousBroker.it  This test is not yet approved or cleared by the [place]  FDA and has been authorized for detection and/or diagnosis of SARS-CoV-2 by FDA under an Emergency Use Authorization (EUA). This EUA will remain in effect (meaning this test can be used) for the duration of the COVID-19 declaration under Section 564(b)(1) of the Act, 21 U.S.C. section 360bbb-3(b)(1), unless the authorization is terminated or revoked.     Resp Syncytial Virus by PCR NEGATIVE NEGATIVE Final     Comment: (NOTE) Fact Sheet for Patients: BloggerCourse.com  Fact Sheet for Healthcare Providers: SeriousBroker.it  This test is not yet approved or cleared by the [place]  FDA and has been authorized for detection and/or diagnosis of SARS-CoV-2 by FDA under an Emergency Use Authorization (EUA). This EUA will remain in effect (meaning this test can be used) for the duration of the COVID-19 declaration under Section 564(b)(1) of the Act, 21 U.S.C. section 360bbb-3(b)(1), unless the authorization is terminated or revoked.  Performed at [facility], [address] [place], [place] [address]  Labs: CBC: Recent Labs  Lab 10/19/23 0418 10/20/23 0423 10/21/23 0426 10/22/23 0520 10/23/23 0409  WBC 6.7 5.9 5.7 6.0 5.2  NEUTROABS  --   --  3.2 3.2 3.0  HGB 12.3 11.9* 11.2* 12.4 11.5*  HCT 38.8 38.3 36.2 40.0 38.2  MCV 98.7 99.7 101.7* 101.8* 103.0*  PLT 130* 106* 107* 129* 121*   Basic Metabolic Panel: Recent Labs  Lab 10/17/23 0455 10/18/23 0456 10/19/23 0418 10/20/23 0423 10/21/23 0426 10/22/23 0520 10/23/23 0409  NA 139 141 139 143 142 141 140  K 4.3 3.8 4.0 3.9 4.0 4.1 3.9  CL 109 110 111 116* 117* 116* 116*  CO2 19* 20* 19* 18* 18* 17* 16*  GLUCOSE 98 107* 112* 118* 121* 122* 103*  BUN 28* 30* 25* 24* 22 20 19   CREATININE 1.11* 1.32* 1.06* 0.90 0.97 0.98 0.83  CALCIUM  9.4 9.2 8.8* 8.7* 8.8* 9.0 8.9  MG 2.1 2.0  --   --   --   --   --   PHOS 3.8 3.4  --   --   --   --   --    Liver Function Tests: Recent Labs  Lab 10/19/23 0635 10/20/23 0423 10/21/23 0426 10/22/23 0520 10/23/23 0409  AST 45* 52* 42* 42* 33  ALT 34 35 33 35 32  ALKPHOS 122 123 103 119 111  BILITOT 2.0* 2.5* 2.3* 1.7* 1.4*  PROT 4.8* 4.8* 4.6* 5.1* 4.7*  ALBUMIN  2.9* 3.0* 2.8* 3.0* 2.8*   CBG: No results for input(s): GLUCAP in the last 168 hours.  Discharge time spent: greater than 30  minutes.  Signed: Delon Herald, MD [facility] 10/23/2023

## 2023-10-23 NOTE — TOC Transition Note (Signed)
 Transition of Care [facility]) - Discharge Note   Patient Details  Name: [name] MRN: [id number] Date of Birth: May 03, 1955  [facility] CM/SW Contact:  Bascom Service, RN Phone Number: 10/23/2023, 1:41 PM   Clinical Narrative:  spoke to [name] d/c home w/[facility] rep Amy already following all agree to HHRN/PT/OT/aide ordered. Has own transport home. No further CM needs.     Final next level of care: Home w Home Health Services Barriers to Discharge: No Barriers Identified   Patient Goals and CMS Choice Patient states their goals for this hospitalization and ongoing recovery are:: To return home CMS Medicare.gov Compare Post Acute Care list provided to:: Patient Represenative (must comment) Choice offered to / list presented to : Adult Children [place] ownership interest in [facility].provided to:: Adult Children    Discharge Placement                       Discharge Plan and Services Additional resources added to the After Visit Summary for   In-house Referral: Clinical Social Work Discharge Planning Services: CM Consult Post Acute Care Choice: Home Health          DME Arranged: N/A DME Agency: NA       HH Arranged: RN, PT, OT, Nurse's Aide HH Agency: [facility] Date [facility] Agency Contacted: 10/23/23 Time HH Agency Contacted: 1341 Representative spoke with at [facility] Agency: Amy  Social Drivers of Health (SDOH) Interventions SDOH Screenings   Food Insecurity: No Food Insecurity (10/09/2023)  Housing: Low Risk  (10/09/2023)  Transportation Needs: No Transportation Needs (10/09/2023)  Utilities: Not At Risk (10/09/2023)  Alcohol Screen: Low Risk  (09/20/2023)  Depression (PHQ2-9): Medium Risk (09/20/2023)  Financial Resource Strain: Low Risk  (09/20/2023)  Physical Activity: Inactive (09/20/2023)  Social Connections: Unknown (10/09/2023)  Stress: No Stress Concern Present (09/20/2023)  Tobacco Use: Medium Risk (10/13/2023)  Health  Literacy: Adequate Health Literacy (09/20/2023)     Readmission Risk Interventions    10/10/2023    2:52 PM 08/25/2023   10:04 AM 08/09/2023   12:37 PM  Readmission Risk Prevention Plan  Transportation Screening Complete Complete   Medication Review Oceanographer) Complete Complete Complete  PCP or Specialist appointment within 3-5 days of discharge Complete  Complete  HRI or Home Care Consult Complete Complete Complete  SW Recovery Care/Counseling Consult Complete Complete Complete  Palliative Care Screening Not Applicable Not Applicable Not Applicable  Skilled Nursing Facility Not Applicable Not Applicable Not Applicable

## 2023-10-23 NOTE — Plan of Care (Signed)
  Problem: Clinical Measurements: Goal: Ability to maintain clinical measurements within normal limits will improve Outcome: Progressing Goal: Will remain free from infection Outcome: Progressing Goal: Diagnostic test results will improve Outcome: Progressing Goal: Respiratory complications will improve Outcome: Progressing Goal: Cardiovascular complication will be avoided Outcome: Progressing   Problem: Activity: Goal: Risk for activity intolerance will decrease Outcome: Progressing   Problem: Coping: Goal: Level of anxiety will decrease Outcome: Progressing

## 2023-10-23 NOTE — Progress Notes (Signed)
 Physical Therapy Treatment Patient Details Name: [name] MRN: [id number] DOB: [birth year] Today's Date: 10/23/2023   History of Present Illness Patient is a 68 yo female presenting to the ED with AMS on 10/08/23. Admitted with same diagnosis, with noted elevated ammonia levels.  PMH includes decompensated cirrhosis, iron  deficiency anemia, anemia of chronic disease, cirrhosis, blind in L eye.    PT Comments  Pt motivated, sitting edge of bed with son at bedside, ready to d/c home. Pt completes transfers with RW and modified ind, amb with supv using RW, cues for maneuvering obstacles at safe distance and maintaining body within RW frame with turns. Pt conversational with amb, mild dyspnea noted. Pt self motivated, reports ambulated twice today with RW and son accompanying her in hallway. Pt returns to sitting edge of bed with son in room at end of session. Cont to recommend HHPT at d/c with son support.   If plan is discharge home, recommend the following: A little help with walking and/or transfers;A little help with bathing/dressing/bathroom;Assistance with cooking/housework;Help with stairs or ramp for entrance   Can travel by private vehicle        Equipment Recommendations  None recommended by PT    Recommendations for Other Services       Precautions / Restrictions Precautions Precautions: Fall Recall of Precautions/Restrictions: Intact Restrictions Weight Bearing Restrictions Per Provider Order: No     Mobility  Bed Mobility               General bed mobility comments: sitting at bedside    Transfers Overall transfer level: Modified independent Equipment used: Rolling walker (2 wheels)               General transfer comment: pt powers to stand without assist or cues, good steadiness    Ambulation/Gait Ambulation/Gait assistance: Supervision Gait Distance (Feet): 140 Feet Assistive device: Rolling walker (2 wheels) Gait Pattern/deviations:  Step-through pattern, Decreased stride length, Trunk flexed Gait velocity: decreased     General Gait Details: slow, step through gait pattern, minimal bil foot clearance, increased time and steps to complete turns and navigate around obstacles, conversational with mild dyspnea noted   Stairs             Wheelchair Mobility     Tilt Bed    Modified Rankin (Stroke Patients Only)       Balance Overall balance assessment: Needs assistance Sitting-balance support: Feet supported, No upper extremity supported Sitting balance-Leahy Scale: Fair Sitting balance - Comments: did not challenge   Standing balance support: Bilateral upper extremity supported, During functional activity, Reliant on assistive device for balance Standing balance-Leahy Scale: Fair Standing balance comment: fair static without UE support or challenge, dynamic with RW                            Communication Communication Communication: No apparent difficulties  Cognition Arousal: Alert Behavior During Therapy: WFL for tasks assessed/performed   PT - Cognitive impairments: No apparent impairments                       PT - Cognition Comments: pt pleasant, follows commands, ready to d/c home with son support Following commands: Intact      Cueing Cueing Techniques: Verbal cues  Exercises      General Comments        Pertinent Vitals/Pain Pain Assessment Pain Assessment: No/denies pain    Home Living  Prior Function            PT Goals (current goals can now be found in the care plan section) Acute Rehab PT Goals Patient Stated Goal: return home PT Goal Formulation: With patient Time For Goal Achievement: 11/06/23 Potential to Achieve Goals: Good Progress towards PT goals: Progressing toward goals    Frequency    Min 3X/week      PT Plan      Co-evaluation              AM-PAC PT 6 Clicks Mobility    Outcome Measure  Help needed turning from your back to your side while in a flat bed without using bedrails?: None Help needed moving from lying on your back to sitting on the side of a flat bed without using bedrails?: A Little Help needed moving to and from a bed to a chair (including a wheelchair)?: A Little Help needed standing up from a chair using your arms (e.g., wheelchair or bedside chair)?: A Little Help needed to walk in hospital room?: A Little Help needed climbing 3-5 steps with a railing? : A Lot 6 Click Score: 18    End of Session Equipment Utilized During Treatment: Gait belt Activity Tolerance: Patient tolerated treatment well Patient left: in bed;with call bell/phone within reach;with family/visitor present (sitting EOB) Nurse Communication: Mobility status PT Visit Diagnosis: Muscle weakness (generalized) (M62.81);Other abnormalities of gait and mobility (R26.89)     Time: 8746-8696 PT Time Calculation (min) (ACUTE ONLY): 10 min  Charges:    $Gait Training: 8-22 mins PT General Charges $$ ACUTE PT VISIT: 1 Visit                     Tori Jalesha Plotz PT, DPT 10/23/23, 1:13 PM

## 2023-10-24 ENCOUNTER — Telehealth: Payer: Self-pay | Admitting: *Deleted

## 2023-10-24 ENCOUNTER — Other Ambulatory Visit: Payer: Medicare (Managed Care) | Admitting: Licensed Clinical Social Worker

## 2023-10-24 ENCOUNTER — Telehealth: Payer: Self-pay

## 2023-10-24 DIAGNOSIS — E872 Acidosis, unspecified: Secondary | ICD-10-CM | POA: Diagnosis not present

## 2023-10-24 DIAGNOSIS — E669 Obesity, unspecified: Secondary | ICD-10-CM | POA: Diagnosis not present

## 2023-10-24 DIAGNOSIS — I10 Essential (primary) hypertension: Secondary | ICD-10-CM | POA: Diagnosis not present

## 2023-10-24 DIAGNOSIS — K7469 Other cirrhosis of liver: Secondary | ICD-10-CM | POA: Diagnosis not present

## 2023-10-24 NOTE — Patient Outreach (Signed)
 Complex Care Management   Visit Note  10/24/2023  Name:  [name] MRN: [id number] DOB: 01-29-1955  Situation: Referral received for Complex Care Management related to SDOH Barriers:  Medicaid application  I obtained verbal consent from Caregiver.  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 It was stated that the patients [employer] insuranse was changing and the son Vilinda) was going to call to get an update on the change.   SDOH Interventions    Flowsheet Row ED to Hosp-Admission (Discharged) from 10/08/2023 in [facility] Patient Outreach from 09/25/2023 in [place] POPULATION HEALTH DEPARTMENT Clinical Support from 09/20/2023 in [facility] Telephone from 08/30/2023 in [place] POPULATION HEALTH DEPARTMENT Telephone from 08/17/2023 in [place] POPULATION HEALTH DEPARTMENT Patient Outreach from 06/09/2023 in [place] POPULATION HEALTH DEPARTMENT  SDOH  Interventions        Food Insecurity Interventions Intervention Not Indicated Intervention Not Indicated Intervention Not Indicated Intervention Not Indicated Intervention Not Indicated Intervention Not Indicated  Housing Interventions -- Intervention Not Indicated Intervention Not Indicated Intervention Not Indicated Intervention Not Indicated Intervention Not Indicated  Transportation Interventions -- Intervention Not Indicated Intervention Not Indicated Intervention Not Indicated Intervention Not Indicated Intervention Not Indicated  Utilities Interventions -- Intervention Not Indicated Intervention Not Indicated Intervention Not Indicated Intervention Not Indicated Intervention Not Indicated  Alcohol Usage Interventions -- -- Intervention Not Indicated (Score <7) -- -- --  Financial Strain Interventions -- -- Intervention Not Indicated -- -- Intervention Not Indicated  Physical Activity Interventions -- -- Intervention Not Indicated, Patient Declined -- -- --  Stress Interventions -- -- Intervention Not Indicated -- -- --  Social Connections Interventions -- -- Intervention Not Indicated -- -- --  Health Literacy Interventions -- -- Intervention Not Indicated -- -- --       Recommendation:   none  Follow Up Plan:   Telephone follow up appointment date/time:  11/07/2023 at 11:00 am  Tobias [name], PhD [facility], [facility] Social Worker Direct Dial: [phone]  Fax: [phone]

## 2023-10-24 NOTE — Telephone Encounter (Signed)
 Copied from CRM [id number]. Topic: Clinical - Home Health Verbal Orders >> Oct 24, 2023 11:24 AM Jeoffrey H wrote: Caller/Agency: Betsie from [facility] Callback Number: [phone] Service Requested: Physical Therapy  Frequency: Start of care date for Thursday October 16th per patient request. Patient was just d/c from the hospital so unsure of frequency. Any new concerns about the patient? Yes, d/c from hospital- Betsie unsure why she was in the hospital

## 2023-10-24 NOTE — Patient Instructions (Signed)
 Visit Information  Thank you for taking time to visit with me today. Please don't hesitate to contact me if I can be of assistance to you before our next scheduled appointment.  Your next care management appointment is by telephone on 11/07/2023 at 11:00 am    Please call the care guide team at [phone] if you need to cancel, schedule, or reschedule an appointment.   Please call the Suicide and Crisis Lifeline: 988 go to [facility] [name] [place] [phone]) call 911 if you are experiencing a Mental Health or Behavioral Health Crisis or need someone to talk to.  Tobias [name], PhD [facility], [facility] Social Worker Direct Dial: [phone]  Fax: [phone]

## 2023-10-24 NOTE — Telephone Encounter (Signed)
 Betsie informed.

## 2023-10-24 NOTE — Transitions of Care (Post Inpatient/ED Visit) (Signed)
 10/24/2023  Name: [name] MRN: [id number] DOB: [birth year]  Today's TOC FU Call Status: Today's TOC FU Call Status:: Successful TOC FU Call Completed TOC FU Call Complete Date: 10/24/23 Patient's Name and Date of Birth confirmed.  Transition Care Management Follow-up Telephone Call Date of Discharge: 10/23/23 Discharge Facility: [facility]) Type of Discharge: Inpatient Admission Primary Inpatient Discharge Diagnosis:: Acute hepatic encephalopathy How have you been since you were released from the hospital?: Better (eating, drinking well, having several bowel movements per day) Any questions or concerns?: No  Items Reviewed: Did you receive and understand the discharge instructions provided?: Yes Medications obtained,verified, and reconciled?: Yes (Medications Reviewed) Any new allergies since your discharge?: No Dietary orders reviewed?: Yes Type of Diet Ordered:: heart healthy Do you have support at home?: Yes People in Home [RPT]: child(ren), adult Name of Support/Comfort Primary Source: [name]-  son -  lives with pt  Medications Reviewed Today: Medications Reviewed Today     Reviewed by Aura Mliss LABOR, RN (Registered Nurse) on 10/24/23 at 1123  Med List Status: <None>   Medication Order Taking? Sig Documenting Provider Last Dose Status Informant  albuterol  (VENTOLIN  HFA) 108 (90 Base) MCG/ACT inhaler [id number] Yes Inhale 2 puffs into the lungs every 6 (six) hours as needed for shortness of breath or wheezing. [provider]  Active Family Member  atorvastatin  (LIPITOR) 40 MG tablet [id number] Yes TAKE 1 TABLET(40 MG) BY MOUTH IN THE ERMALINDA [name], MD  Active Family Member  copper  tablet [id number] Yes Take 2 mg by mouth in the morning and at bedtime. [provider]  Active Family Member  cyanocobalamin  (VITAMIN B12) 1000 MCG tablet [id number] Yes Take 2,000 mcg by mouth every Monday, Wednesday, and Friday. [provider]  Active  Family Member  dicyclomine  (BENTYL ) 10 MG capsule [id number] Yes Take 1 capsule (10 mg total) by mouth 3 (three) times daily as needed for spasms (abdominal cramping). [name], MD  Active Family Member  EPINEPHrine  0.3 mg/0.3 mL IJ SOAJ injection [id number] Yes Inject 0.3 mg into the muscle as needed for anaphylaxis. [name], MD  Active Family Member  famotidine  (PEPCID ) 20 MG tablet [id number] Yes Take 20 mg by mouth daily as needed for heartburn or indigestion. [provider]  Active Family Member  furosemide  (LASIX ) 40 MG tablet [id number] Yes Take 1 tablet (40 mg total) by mouth 2 (two) times daily. [name], MD  Active Family Member  HYDROcodone -acetaminophen  [facility]) 10-325 MG tablet [id number] Yes Take 1 tablet by mouth every 8 (eight) hours as needed (for pain). [provider]  Active Family Member  IRON -VITAMIN C  PO [id number] Yes Take 1 tablet by mouth daily with breakfast. [provider]  Active Family Member  lactulose  (CHRONULAC ) 10 GM/15ML solution [id number] Yes Take 45 mLs (30 g total) by mouth 4 (four) times daily. Barbarann Nest, MD  Active   levothyroxine  (SYNTHROID ) 175 MCG tablet [id number] Yes TAKE 1 TABLET BY MOUTH DAILY A 6AM. RECHECK TSH WITH PCP IN 4 WEEKS [name], MD  Active Family Member  magnesium  gluconate (MAGONATE) 500 (27 Mg) MG TABS tablet [id number] Yes Take 1 tablet (500 mg total) by mouth in the morning and at bedtime. [name], MD  Active Family Member  megestrol  (MEGACE ) 40 MG tablet [id number] Yes Take 1 tablet (40 mg total) by mouth 2 (two) times daily. Ajewole, [name], MD  Active Family Member  methocarbamol  (ROBAXIN ) 500 MG tablet [id number] Yes Take 500 mg by mouth in the  morning. [provider]  Active Family Member  Multiple Vitamins-Minerals (CENTRUM SILVER ULTRA WOMENS PO) [id number] Yes Take 1 tablet by mouth in the morning. [provider]  Active Family Member  naloxone  [facility]) nasal spray 4 mg/0.1 mL [id number] Yes Place 1 spray into the nose as needed (accidental overdose). [provider]  Active Family Member           Med Note ROLENE [place], [place] A   Wed Mar 08, 2023  2:39 PM)    nystatin  cream (MYCOSTATIN ) [id number] Yes Apply 1 Application topically 2 (two) times daily as needed for dry skin. Cheryle Page, MD  Active Family Member  pantoprazole  (PROTONIX ) 40 MG tablet [id number] Yes Take 1 tablet (40 mg total) by mouth 2 (two) times daily before a meal. [name], MD  Active Family Member  potassium chloride  (KLOR-CON ) 10 MEQ tablet [id number] Yes Take 2 tablets (20 mEq total) by mouth 2 (two) times daily. [name], MD  Active Family Member  rifaximin  (XIFAXAN ) 550 MG TABS tablet [id number] Yes Take 550 mg by mouth 2 (two) times daily. [provider]  Active Family Member  rOPINIRole  (REQUIP ) 0.25 MG tablet [id number] Yes TAKE 1 TABLET(0.25 MG) BY MOUTH AT BEDTIME [name], MD  Active Family Member  sodium bicarbonate  650 MG tablet [id number] Yes Take 1 tablet (650 mg total) by mouth 3 (three) times daily for 3 days. Barbarann Nest, MD  Active   spironolactone  (ALDACTONE ) 25 MG tablet [id number] Yes Take 0.5 tablets (12.5 mg total) by mouth daily. Barbarann Nest, MD  Active   triamcinolone  cream (KENALOG ) 0.1 % [id number] Yes Apply 1 Application topically 2 (two) times daily as needed (for itching- affected areas). [provider]  Active Family Member  venlafaxine  XR (EFFEXOR -XR) 75 MG 24 hr capsule [id number] Yes Take 1 capsule (75 mg total) by mouth daily with breakfast. [name], MD  Active Family Member  Vitamin D , Ergocalciferol , (DRISDOL) 50000 UNITS CAPS [id number] Yes Take 50,000 Units by mouth every Wednesday. [provider]  Active Family Member  Med List Note ROLENE Evern Devere DELENA Bishop 03/08/23 1450): Pt's son Crystol Walpole helps with medications            Home Care and  Equipment/Supplies: Were Home Health Services Ordered?: Yes Name of Home Health Agency:: [facility] Has Agency set up a time to come to your home?: Yes First Home Health Visit Date: 10/26/23 Any new equipment or medical supplies ordered?: No  Functional Questionnaire: Do you need assistance with bathing/showering or dressing?: Yes (family assists if needed) Do you need assistance with meal preparation?: Yes (does not always need assistance, family assists as needed) Do you need assistance with eating?: No Do you have difficulty maintaining continence: No Do you need assistance with getting out of bed/getting out of a chair/moving?:  (walker) Do you have difficulty managing or taking your medications?: Yes (son provides oversight)  Follow up appointments reviewed: PCP Follow-up appointment confirmed?: Yes Date of PCP follow-up appointment?: 11/08/23 Follow-up Provider: Bene Aletha  @ 1220 pm Specialist Hospital Follow-up appointment confirmed?: No (GI-  son called GI today, awaiting call back) Reason Specialist Follow-Up Not Confirmed: Patient has Specialist Provider Number and will Call for Appointment (will see nephrologist today) Do you need transportation to your follow-up appointment?: No Do you understand care options if your condition(s) worsen?: Yes-patient verbalized understanding  SDOH Interventions Today    Flowsheet Row Most Recent Value  SDOH Interventions   Food Insecurity Interventions  Intervention Not Indicated  Housing Interventions Intervention Not Indicated  Transportation Interventions Intervention Not Indicated  Utilities Interventions Intervention Not Indicated     Goals Addressed             This Visit's Progress    VBCI Transitions of Care (TOC) Care Plan       Problems:  Recent Hospitalization for treatment of hepatic encephalopathy/ cirrhosis Per patient and patient's son [name] DPR who reports patient is doing fine reports compliance with  lactulose  and pt having at least 10-12 bowel movements per day, has a call into GI to ask if lactulose  can be decreased due to amount of bowel movements [facility] will be working with pt and has contacted patient's son  Goal:  Over the next 30 days, the patient will not experience hospital readmission  Interventions:  Transitions of Care: Doctor Visits  - discussed the importance of doctor visits Reviewed Signs and symptoms of infection Evaluation of current treatment plan related to acute hepatic encephalopathy, cirrhosis, self-management and patient's adherence to plan as established by provider. Discussed plans with patient for ongoing care management follow up and provided patient with direct contact information for care management team Evaluation of current treatment plan related to cirrhosis  and patient's adherence to plan as established by provider Reviewed medications with patient and discussed importance of taking all medications as prescribed.  Reviewed compliance with lactulose  and importance of regular bowel movements to keep ammonia levels in check. Reviewed scheduled/upcoming provider appointments including primary care provider 11/08/23 Advised patient/ son to discuss frequent readmission with provider Encouraged son to assist to follow low salt diet since he provides meals Reviewed importance of daily weight and keeping a log Reinforced importance of adequate protein in diet  Reviewed bleeding risk due to cirrhosis Encouraged to observe for any signs of infection, confusion and get medical treatment for infections asap Pain assessment completed Reviewed importance of taking lactulose  as prescribed to keep ammonia levels in check  Patient Self Care Activities:  Attend all scheduled provider appointments Attend church or other social activities Call pharmacy for medication refills 3-7 days in advance of running out of medications Call provider office for new  concerns or questions  Notify RN Care Manager of TOC call rescheduling needs Participate in Transition of Care Program/Attend TOC scheduled calls Perform all self care activities independently  Take medications as prescribed   Work with the social worker to address care coordination needs and will continue to work with the clinical team to address health care and disease management related needs Do not skip lactulose , it is important to keep ammonia levels in check Provide adequate protein in diet  Get emergency assistance for bleeding- rectal blood, dark black stools or vomiting of blood  Plan:  Telephone follow up appointment with care management team member scheduled for:  11/01/23 @ 1045 am The patient has been provided with contact information for the care management team and has been advised to call with any health related questions or concerns.         Mliss Creed [facility], BSN RN Care Manager/ Transition of Care [place]/ [facility] [phone]

## 2023-10-25 ENCOUNTER — Ambulatory Visit: Payer: Self-pay | Admitting: Family Medicine

## 2023-10-25 ENCOUNTER — Other Ambulatory Visit: Payer: Self-pay | Admitting: Family Medicine

## 2023-10-25 ENCOUNTER — Other Ambulatory Visit: Payer: Self-pay

## 2023-10-25 DIAGNOSIS — R600 Localized edema: Secondary | ICD-10-CM

## 2023-10-25 MED ORDER — FUROSEMIDE 40 MG PO TABS: 40.0000 mg | ORAL_TABLET | Freq: Two times a day (BID) | ORAL | 1 refills | Status: DC

## 2023-10-25 NOTE — Telephone Encounter (Signed)
 FYI Only or Action Required?: Action required by provider: medication refill request.  Patient was last seen in primary care on 09/04/2023 by [name], MD.  Called Nurse Triage reporting Medication Refill.  Triage Disposition: Call PCP Now  Patient/caregiver understands and will follow disposition?: Yes       Copied from CRM #[id number]. Topic: Clinical - Medication Question >> Oct 25, 2023 12:32 PM Amy B wrote: Reason for CRM: Patient's son is requesting a refill of Senokot; however, this medication is not listed the med list.  Please advise. Reason for Disposition  [1] Prescription refill request for ESSENTIAL medicine (i.e., likelihood of harm to patient if not taken) AND [2] triager unable to refill per department policy  Answer Assessment - Initial Assessment Questions This RN spoke with the patient's son, [name]. Patient has a history of cirrhosis and takes lactulose . Patient also takes Senokot twice daily and needs a refill.  Preferred pharmacy: [facility] [phone] [address] [place], [place], [place] [address] Phone: [phone]  Fax: [phone]  Protocols used: Medication Refill and Renewal Call-A-AH

## 2023-10-25 NOTE — Telephone Encounter (Unsigned)
 Copied from CRM [id number]. Topic: Clinical - Medication Refill >> Oct 25, 2023 12:28 PM Amy B wrote: Medication:  furosemide  (LASIX ) 40 MG tablet   Has the patient contacted their pharmacy? No (Agent: If no, request that the patient contact the pharmacy for the refill. If patient does not wish to contact the pharmacy document the reason why and proceed with request.) (Agent: If yes, when and what did the pharmacy advise?)  This is the patient's preferred pharmacy:  [id number] [place] - [address] [facility] [address] [place] [address] Phone: [phone] Fax: [phone]  Is this the correct pharmacy for this prescription? Yes If no, delete pharmacy and type the correct one.   Has the prescription been filled recently? No  Is the patient out of the medication? Yes  Has the patient been seen for an appointment in the last year OR does the patient have an upcoming appointment? Yes  Can we respond through MyChart? Yes  Agent: Please be advised that Rx refills may take up to 3 business days. We ask that you follow-up with your pharmacy.

## 2023-10-26 ENCOUNTER — Other Ambulatory Visit: Payer: Self-pay

## 2023-10-26 ENCOUNTER — Telehealth: Payer: Self-pay | Admitting: Family Medicine

## 2023-10-26 MED ORDER — SENNOSIDES-DOCUSATE SODIUM 8.6-50 MG PO TABS: 1.0000 | ORAL_TABLET | Freq: Two times a day (BID) | ORAL | 0 refills | Status: DC

## 2023-10-26 NOTE — Telephone Encounter (Signed)
 Verbal orders given

## 2023-10-26 NOTE — Telephone Encounter (Signed)
 Copied from CRM #[id number]. Topic: Clinical - Home Health Verbal Orders >> Oct 26, 2023 10:43 AM Zebedee SAUNDERS wrote: Caller/Agency: [facility] per Victor Rushing Number: [phone] secure line Service Requested: Physical Therapy Frequency: New start of date 10/30/2023. Any new concerns about the patient? No

## 2023-10-27 NOTE — Telephone Encounter (Signed)
 Refilled 10/25/23  Requested Prescriptions  Refused Prescriptions Disp Refills   furosemide  (LASIX ) 40 MG tablet 180 tablet 1    Sig: Take 1 tablet (40 mg total) by mouth 2 (two) times daily.     Cardiovascular:  Diuretics - Loop Failed - 10/27/2023 10:46 AM      Failed - Cl in normal range and within 180 days    Chloride  Date Value Ref Range Status  10/23/2023 116 (H) 98 - 111 mmol/L Final         Passed - K in normal range and within 180 days    Potassium  Date Value Ref Range Status  10/23/2023 3.9 3.5 - 5.1 mmol/L Final         Passed - Ca in normal range and within 180 days    Calcium   Date Value Ref Range Status  10/23/2023 8.9 8.9 - 10.3 mg/dL Final   Calcium , Ion  Date Value Ref Range Status  09/30/2022 1.09 (L) 1.15 - 1.40 mmol/L Final         Passed - Na in normal range and within 180 days    Sodium  Date Value Ref Range Status  10/23/2023 140 135 - 145 mmol/L Final         Passed - Cr in normal range and within 180 days    Creat  Date Value Ref Range Status  09/04/2023 0.97 0.50 - 1.05 mg/dL Final   Creatinine, Ser  Date Value Ref Range Status  10/23/2023 0.83 0.44 - 1.00 mg/dL Final   Creatinine, Urine  Date Value Ref Range Status  05/23/2023 39 mg/dL Final    Comment:    Performed at [facility], [address] [place], [place] [address]         Passed - Mg Level in normal range and within 180 days    Magnesium   Date Value Ref Range Status  10/18/2023 2.0 1.7 - 2.4 mg/dL Final    Comment:    Performed at [facility], [address], [place], [place] [address]         Passed - Last BP in normal range    BP Readings from Last 1 Encounters:  10/23/23 122/60         Passed - Valid encounter within last 6 months    Recent Outpatient Visits           1 month ago Acute hepatic encephalopathy [facility])   [facility] [name], MD   3 months ago Acute hepatic  encephalopathy [facility])   [facility] [name], MD   6 months ago Edema of both lower legs   [place] [facility] [name], MD   7 months ago Pruritus    [facility] [name], MD

## 2023-10-29 ENCOUNTER — Other Ambulatory Visit: Payer: Self-pay

## 2023-10-29 ENCOUNTER — Emergency Department (HOSPITAL_COMMUNITY): Payer: Medicare (Managed Care)

## 2023-10-29 ENCOUNTER — Inpatient Hospital Stay (HOSPITAL_COMMUNITY)
Admission: EM | Admit: 2023-10-29 | Discharge: 2023-11-03 | DRG: 871 | Disposition: A | Discharge status: Home Health | Payer: Medicare (Managed Care) | Attending: Internal Medicine | Admitting: Internal Medicine

## 2023-10-29 DIAGNOSIS — B372 Candidiasis of skin and nail: Secondary | ICD-10-CM | POA: Diagnosis present

## 2023-10-29 DIAGNOSIS — L03116 Cellulitis of left lower limb: Secondary | ICD-10-CM | POA: Diagnosis present

## 2023-10-29 DIAGNOSIS — K7469 Other cirrhosis of liver: Secondary | ICD-10-CM

## 2023-10-29 DIAGNOSIS — K7682 Hepatic encephalopathy: Principal | ICD-10-CM | POA: Diagnosis present

## 2023-10-29 DIAGNOSIS — E872 Acidosis, unspecified: Secondary | ICD-10-CM | POA: Diagnosis present

## 2023-10-29 DIAGNOSIS — K529 Noninfective gastroenteritis and colitis, unspecified: Secondary | ICD-10-CM | POA: Diagnosis present

## 2023-10-29 DIAGNOSIS — F419 Anxiety disorder, unspecified: Secondary | ICD-10-CM | POA: Diagnosis present

## 2023-10-29 DIAGNOSIS — G9341 Metabolic encephalopathy: Secondary | ICD-10-CM | POA: Diagnosis present

## 2023-10-29 DIAGNOSIS — K7460 Unspecified cirrhosis of liver: Secondary | ICD-10-CM | POA: Diagnosis not present

## 2023-10-29 DIAGNOSIS — K90829 Short bowel syndrome, unspecified: Secondary | ICD-10-CM | POA: Diagnosis present

## 2023-10-29 DIAGNOSIS — Z880 Allergy status to penicillin: Secondary | ICD-10-CM

## 2023-10-29 DIAGNOSIS — E785 Hyperlipidemia, unspecified: Secondary | ICD-10-CM | POA: Diagnosis present

## 2023-10-29 DIAGNOSIS — E039 Hypothyroidism, unspecified: Secondary | ICD-10-CM | POA: Diagnosis not present

## 2023-10-29 DIAGNOSIS — R278 Other lack of coordination: Secondary | ICD-10-CM | POA: Diagnosis present

## 2023-10-29 DIAGNOSIS — D259 Leiomyoma of uterus, unspecified: Secondary | ICD-10-CM | POA: Diagnosis present

## 2023-10-29 DIAGNOSIS — E66812 Obesity, class 2: Secondary | ICD-10-CM | POA: Diagnosis present

## 2023-10-29 DIAGNOSIS — B957 Other staphylococcus as the cause of diseases classified elsewhere: Secondary | ICD-10-CM | POA: Diagnosis not present

## 2023-10-29 DIAGNOSIS — D509 Iron deficiency anemia, unspecified: Secondary | ICD-10-CM | POA: Diagnosis present

## 2023-10-29 DIAGNOSIS — I129 Hypertensive chronic kidney disease with stage 1 through stage 4 chronic kidney disease, or unspecified chronic kidney disease: Secondary | ICD-10-CM | POA: Diagnosis not present

## 2023-10-29 DIAGNOSIS — Z91041 Radiographic dye allergy status: Secondary | ICD-10-CM

## 2023-10-29 DIAGNOSIS — Z91038 Other insect allergy status: Secondary | ICD-10-CM

## 2023-10-29 DIAGNOSIS — Z86718 Personal history of other venous thrombosis and embolism: Secondary | ICD-10-CM

## 2023-10-29 DIAGNOSIS — Z8349 Family history of other endocrine, nutritional and metabolic diseases: Secondary | ICD-10-CM

## 2023-10-29 DIAGNOSIS — K7581 Nonalcoholic steatohepatitis (NASH): Secondary | ICD-10-CM | POA: Diagnosis not present

## 2023-10-29 DIAGNOSIS — Z6838 Body mass index (BMI) 38.0-38.9, adult: Secondary | ICD-10-CM | POA: Diagnosis not present

## 2023-10-29 DIAGNOSIS — Z881 Allergy status to other antibiotic agents status: Secondary | ICD-10-CM

## 2023-10-29 DIAGNOSIS — N1831 Chronic kidney disease, stage 3a: Secondary | ICD-10-CM | POA: Diagnosis present

## 2023-10-29 DIAGNOSIS — Z79890 Hormone replacement therapy (postmenopausal): Secondary | ICD-10-CM | POA: Diagnosis not present

## 2023-10-29 DIAGNOSIS — K219 Gastro-esophageal reflux disease without esophagitis: Secondary | ICD-10-CM | POA: Diagnosis not present

## 2023-10-29 DIAGNOSIS — A411 Sepsis due to other specified staphylococcus: Secondary | ICD-10-CM | POA: Diagnosis not present

## 2023-10-29 DIAGNOSIS — Z66 Do not resuscitate: Secondary | ICD-10-CM | POA: Diagnosis present

## 2023-10-29 DIAGNOSIS — Z808 Family history of malignant neoplasm of other organs or systems: Secondary | ICD-10-CM

## 2023-10-29 DIAGNOSIS — Z833 Family history of diabetes mellitus: Secondary | ICD-10-CM

## 2023-10-29 DIAGNOSIS — B370 Candidal stomatitis: Secondary | ICD-10-CM | POA: Diagnosis present

## 2023-10-29 DIAGNOSIS — F32A Depression, unspecified: Secondary | ICD-10-CM | POA: Diagnosis not present

## 2023-10-29 DIAGNOSIS — N179 Acute kidney failure, unspecified: Secondary | ICD-10-CM | POA: Diagnosis present

## 2023-10-29 DIAGNOSIS — E876 Hypokalemia: Secondary | ICD-10-CM | POA: Diagnosis present

## 2023-10-29 DIAGNOSIS — D6959 Other secondary thrombocytopenia: Secondary | ICD-10-CM | POA: Diagnosis not present

## 2023-10-29 DIAGNOSIS — R7881 Bacteremia: Secondary | ICD-10-CM | POA: Diagnosis not present

## 2023-10-29 DIAGNOSIS — Z8049 Family history of malignant neoplasm of other genital organs: Secondary | ICD-10-CM

## 2023-10-29 DIAGNOSIS — Z79899 Other long term (current) drug therapy: Secondary | ICD-10-CM

## 2023-10-29 DIAGNOSIS — Z87891 Personal history of nicotine dependence: Secondary | ICD-10-CM

## 2023-10-29 DIAGNOSIS — R109 Unspecified abdominal pain: Secondary | ICD-10-CM | POA: Diagnosis present

## 2023-10-29 DIAGNOSIS — I672 Cerebral atherosclerosis: Secondary | ICD-10-CM | POA: Diagnosis present

## 2023-10-29 DIAGNOSIS — Z8249 Family history of ischemic heart disease and other diseases of the circulatory system: Secondary | ICD-10-CM

## 2023-10-29 DIAGNOSIS — I9589 Other hypotension: Secondary | ICD-10-CM | POA: Diagnosis present

## 2023-10-29 DIAGNOSIS — Z888 Allergy status to other drugs, medicaments and biological substances status: Secondary | ICD-10-CM

## 2023-10-29 DIAGNOSIS — M549 Dorsalgia, unspecified: Secondary | ICD-10-CM | POA: Diagnosis present

## 2023-10-29 DIAGNOSIS — G8929 Other chronic pain: Secondary | ICD-10-CM | POA: Diagnosis present

## 2023-10-29 DIAGNOSIS — Z9049 Acquired absence of other specified parts of digestive tract: Secondary | ICD-10-CM

## 2023-10-29 LAB — COMPREHENSIVE METABOLIC PANEL WITH GFR
ALT: 30 U/L (ref 0–44)
AST: 39 U/L (ref 15–41)
Albumin: 3.6 g/dL (ref 3.5–5.0)
Alkaline Phosphatase: 114 U/L (ref 38–126)
Anion gap: 14 (ref 5–15)
BUN: 29 mg/dL — ABNORMAL HIGH (ref 8–23)
CO2: 15 mmol/L — ABNORMAL LOW (ref 22–32)
Calcium: 9.2 mg/dL (ref 8.9–10.3)
Chloride: 112 mmol/L — ABNORMAL HIGH (ref 98–111)
Creatinine, Ser: 1.57 mg/dL — ABNORMAL HIGH (ref 0.44–1.00)
GFR, Estimated: 36 mL/min — ABNORMAL LOW (ref >60)
Glucose, Bld: 149 mg/dL — ABNORMAL HIGH (ref 70–99)
Potassium: 3.4 mmol/L — ABNORMAL LOW (ref 3.5–5.1)
Sodium: 141 mmol/L (ref 135–145)
Total Bilirubin: 1.7 mg/dL — ABNORMAL HIGH (ref 0.0–1.2)
Total Protein: 6.4 g/dL — ABNORMAL LOW (ref 6.5–8.1)

## 2023-10-29 LAB — CBC
HCT: 45.2 % (ref 36.0–46.0)
Hemoglobin: 14 g/dL (ref 12.0–15.0)
MCH: 31 pg (ref 26.0–34.0)
MCHC: 31 g/dL (ref 30.0–36.0)
MCV: 100 fL (ref 80.0–100.0)
Platelets: 131 K/uL — ABNORMAL LOW (ref 150–400)
RBC: 4.52 MIL/uL (ref 3.87–5.11)
RDW: 15.9 % — ABNORMAL HIGH (ref 11.5–15.5)
WBC: 12.8 K/uL — ABNORMAL HIGH (ref 4.0–10.5)
nRBC: 0 % (ref 0.0–0.2)

## 2023-10-29 LAB — I-STAT CG4 LACTIC ACID, ED
Lactic Acid, Venous: 3.1 mmol/L (ref 0.5–1.9)
Lactic Acid, Venous: 2.3 mmol/L (ref 0.5–1.9)

## 2023-10-29 LAB — RESP PANEL BY RT-PCR (RSV, FLU A&B, COVID)  RVPGX2
Influenza A by PCR: NEGATIVE
Influenza B by PCR: NEGATIVE
Resp Syncytial Virus by PCR: NEGATIVE
SARS Coronavirus 2 by RT PCR: NEGATIVE

## 2023-10-29 LAB — LIPASE, BLOOD: Lipase: 46 U/L (ref 11–51)

## 2023-10-29 LAB — AMMONIA: Ammonia: 67 umol/L — ABNORMAL HIGH (ref 9–35)

## 2023-10-29 MED ORDER — LACTULOSE 10 GM/15ML PO SOLN
20.0000 g | Freq: Once | ORAL | Status: AC
  Administered 2023-10-29: 20 g via ORAL
  Filled 2023-10-29: qty 30

## 2023-10-29 MED ORDER — SODIUM CHLORIDE 0.9 % IV BOLUS
1000.0000 mL | Freq: Once | INTRAVENOUS | Status: AC
  Administered 2023-10-29: 1000 mL via INTRAVENOUS

## 2023-10-29 MED ORDER — SODIUM CHLORIDE 0.9 % IV SOLN
1.0000 g | Freq: Once | INTRAVENOUS | Status: AC
  Administered 2023-10-29: 1 g via INTRAVENOUS
  Filled 2023-10-29: qty 10

## 2023-10-29 NOTE — ED Provider Notes (Signed)
 [place] [facility] DEPARTMENT AT [facility] Provider Note   CSN: [id number] Arrival date & time: 10/29/23  1914     Patient presents with: Abdominal Pain   [name] is a 68 y.o. female.   68 year old female brought in by son from home with concern for altered mental status.  Son states patient was recently discharged from the hospital with hepatic encephalopathy.  Had been doing well until today when it mental status started to decline again.  Patient has also been complaining of headaches today which is more often and worse than usual.  Also with generalized body itching.       Prior to Admission medications   Medication Sig Start Date End Date Taking? Authorizing Provider  albuterol  (VENTOLIN  HFA) 108 (90 Base) MCG/ACT inhaler Inhale 2 puffs into the lungs every 6 (six) hours as needed for shortness of breath or wheezing. 02/13/23   [provider]  atorvastatin  (LIPITOR) 40 MG tablet TAKE 1 TABLET(40 MG) BY MOUTH IN THE MORNING 09/18/23   [name], MD  copper  tablet Take 2 mg by mouth in the morning and at bedtime.    [provider]  cyanocobalamin  (VITAMIN B12) 1000 MCG tablet Take 2,000 mcg by mouth every Monday, Wednesday, and Friday.    [provider]  dicyclomine  (BENTYL ) 10 MG capsule Take 1 capsule (10 mg total) by mouth 3 (three) times daily as needed for spasms (abdominal cramping). 08/07/23   [name], MD  EPINEPHrine  0.3 mg/0.3 mL IJ SOAJ injection Inject 0.3 mg into the muscle as needed for anaphylaxis. 07/27/23   [name], MD  famotidine  (PEPCID ) 20 MG tablet Take 20 mg by mouth daily as needed for heartburn or indigestion.    [provider]  furosemide  (LASIX ) 40 MG tablet Take 1 tablet (40 mg total) by mouth 2 (two) times daily. 10/25/23   [name], MD  HYDROcodone -acetaminophen  (NORCO) 10-325 MG tablet Take 1 tablet by mouth every 8 (eight) hours as needed (for pain). 06/15/23   [provider]  IRON -VITAMIN C  PO Take 1 tablet by mouth daily with breakfast.    [provider]  lactulose  (CHRONULAC ) 10 GM/15ML solution Take 45 mLs (30 g total) by mouth 4 (four) times daily. 10/23/23   Barbarann Nest, MD  levothyroxine  (SYNTHROID ) 175 MCG tablet TAKE 1 TABLET BY MOUTH DAILY A 6AM. RECHECK TSH WITH PCP IN 4 WEEKS 09/20/23   [name], MD  magnesium  gluconate (MAGONATE) 500 (27 Mg) MG TABS tablet Take 1 tablet (500 mg total) by mouth in the morning and at bedtime. 05/01/23   [name], MD  megestrol  (MEGACE ) 40 MG tablet Take 1 tablet (40 mg total) by mouth 2 (two) times daily. 05/04/23   Ajewole, [name], MD  methocarbamol  (ROBAXIN ) 500 MG tablet Take 500 mg by mouth in the morning.    [provider]  Multiple Vitamins-Minerals (CENTRUM SILVER ULTRA WOMENS PO) Take 1 tablet by mouth in the morning.    [provider]  naloxone [facility]) nasal spray 4 mg/0.1 mL Place 1 spray into the nose as needed (accidental overdose). 03/25/22   [provider]  nystatin  cream (MYCOSTATIN ) Apply 1 Application topically 2 (two) times daily as needed for dry skin. 05/29/23   Cheryle Page, MD  pantoprazole  (PROTONIX ) 40 MG tablet Take 1 tablet (40 mg total) by mouth 2 (two) times daily before a meal. 06/09/23   [name], MD  potassium chloride  (KLOR-CON ) 10 MEQ tablet Take 2  tablets (20 mEq total) by mouth 2 (two) times daily. 08/07/23   [name], MD  rifaximin  (XIFAXAN ) 550 MG TABS tablet Take 550 mg by mouth 2 (two) times daily.    [provider]  rOPINIRole  (REQUIP ) 0.25 MG tablet TAKE 1 TABLET(0.25 MG) BY MOUTH AT BEDTIME 06/10/23   [name], MD  senna-docusate (SENOKOT-S) 8.6-50 MG tablet Take 1 tablet by mouth 2 (two) times daily. 10/26/23   [name], MD  spironolactone  (ALDACTONE ) 25 MG tablet Take 0.5 tablets (12.5 mg total) by mouth daily. 10/24/23   Barbarann Nest, MD  triamcinolone  cream (KENALOG ) 0.1 %  Apply 1 Application topically 2 (two) times daily as needed (for itching- affected areas).    [provider]  venlafaxine  XR (EFFEXOR -XR) 75 MG 24 hr capsule Take 1 capsule (75 mg total) by mouth daily with breakfast. 07/20/23   [name], MD  Vitamin D , Ergocalciferol , (DRISDOL) 50000 UNITS CAPS Take 50,000 Units by mouth every Wednesday.    [provider]    Allergies: Ace inhibitors, Gadolinium derivatives, Ivp dye [iodinated contrast media], Wound dressing adhesive, Desitin [zinc  oxide], Yellow jacket venom, Chlorhexidine , Doxycycline, Penicillins, and Zofran  [ondansetron ]    Review of Systems Level 5 caveat return to mental status Updated Vital Signs BP 114/69   Pulse 99   Temp 98.5 F (36.9 C)   Resp 19   SpO2 96%   Physical Exam Vitals and nursing note reviewed.  Constitutional:      Appearance: She is obese.     Comments: Chronically ill-appearing  HENT:     Head: Normocephalic and atraumatic.  Cardiovascular:     Rate and Rhythm: Regular rhythm. Tachycardia present.     Heart sounds: Normal heart sounds.  Pulmonary:     Effort: Pulmonary effort is normal.     Breath sounds: Normal breath sounds.  Abdominal:     Palpations: Abdomen is soft.     Tenderness: There is no abdominal tenderness.  Skin:    General: Skin is warm and dry.     Coloration: Skin is pale.     Findings: No erythema or rash.  Neurological:     Mental Status: She is alert and oriented to person, place, and time.  Psychiatric:        Behavior: Behavior normal.     (all labs ordered are listed, but only abnormal results are displayed) Labs Reviewed  COMPREHENSIVE METABOLIC PANEL WITH GFR - Abnormal; Notable for the following components:      Result Value   Potassium 3.4 (*)    Chloride 112 (*)    CO2 15 (*)    Glucose, Bld 149 (*)    BUN 29 (*)    Creatinine, Ser 1.57 (*)    Total Protein 6.4 (*)    Total Bilirubin 1.7 (*)    GFR, Estimated 36 (*)    All other  components within normal limits  CBC - Abnormal; Notable for the following components:   WBC 12.8 (*)    RDW 15.9 (*)    Platelets 131 (*)    All other components within normal limits  AMMONIA - Abnormal; Notable for the following components:   Ammonia 67 (*)    All other components within normal limits  I-STAT CG4 LACTIC ACID, ED - Abnormal; Notable for the following components:   Lactic Acid, Venous 3.1 (*)    All other components within normal limits  I-STAT CG4 LACTIC ACID, ED - Abnormal; Notable for the following  components:   Lactic Acid, Venous 2.3 (*)    All other components within normal limits  RESP PANEL BY RT-PCR (RSV, FLU A&B, COVID)  RVPGX2  CULTURE, BLOOD (ROUTINE X 2)  CULTURE, BLOOD (ROUTINE X 2)  URINE CULTURE  LIPASE, BLOOD  PROTIME-INR  URINALYSIS, W/ REFLEX TO CULTURE (INFECTION SUSPECTED)    EKG: EKG Interpretation Date/Time:  Sunday October 29 2023 20:27:55 EDT Ventricular Rate:  108 PR Interval:  182 QRS Duration:  77 QT Interval:  332 QTC Calculation: 445 R Axis:   -10  Text Interpretation: Sinus tachycardia Left ventricular hypertrophy Anterior infarct, old Confirmed by Bari Flank [phone]) on 10/29/2023 10:45:51 PM  Radiology: CT ABDOMEN PELVIS WO CONTRAST Result Date: 10/29/2023 CLINICAL DATA:  Acute abdominal pain EXAM: CT ABDOMEN AND PELVIS WITHOUT CONTRAST TECHNIQUE: Multidetector CT imaging of the abdomen and pelvis was performed following the standard protocol without IV contrast. RADIATION DOSE REDUCTION: This exam was performed according to the departmental dose-optimization program which includes automated exposure control, adjustment of the mA and/or kV according to patient size and/or use of iterative reconstruction technique. COMPARISON:  08/24/2023 FINDINGS: Lower chest: No acute pleural or parenchymal lung disease. Hepatobiliary: Cirrhotic morphology of the liver. No focal parenchymal abnormalities on this limited unenhanced exam.  Prior cholecystectomy. Pancreas: Unremarkable unenhanced appearance. Spleen: The spleen is enlarged, measuring 11.3 x 14.4 x 5.9 cm. Adrenals/Urinary Tract: Nonobstructing 4 mm calculus upper pole right kidney. No left-sided calculi. No obstructive uropathy. The adrenals are unremarkable. Bladder is decompressed, limiting its evaluation. Stomach/Bowel: No bowel obstruction or ileus. Scattered gas fluid levels throughout the colon may reflect diarrhea or recent laxative/enema use. No bowel wall thickening or inflammatory change. Vascular/Lymphatic: Aortic atherosclerosis. No enlarged abdominal or pelvic lymph nodes. Reproductive: Stable enlarged fibroid uterus.  No adnexal masses. Other: No free fluid or free intraperitoneal gas. Wide diastasis of the rectus musculature again noted. Fat containing midline infraumbilical ventral hernia unchanged. Musculoskeletal: There are no acute displaced fractures. Chronic T12 compression fracture with prior vertebral augmentation. Reconstructed images demonstrate no additional findings. IMPRESSION: 1. Nonobstructing 4 mm right renal calculus. 2. Scattered gas fluid levels throughout the colon which may reflect diarrhea or recent laxative/enema use. No bowel obstruction or ileus. 3. Cirrhosis. 4. Splenomegaly, likely due to portal venous hypertension. 5.  Aortic Atherosclerosis (ICD10-I70.0). 6. Stable fat containing infraumbilical midline ventral hernia. Electronically Signed   By: Ozell Daring M.D.   On: 10/29/2023 21:38   DG Chest Port 1 View Result Date: 10/29/2023 EXAM: 1 VIEW XRAY OF THE CHEST 10/29/2023 08:17:00 PM COMPARISON: Chest x-ray 08/24/2023. CT chest 06/28/2023. CLINICAL HISTORY: Cough 10031. RLQ abdominal pain (persistent since release 4 days ago), slight AMS. FINDINGS: LUNGS AND PLEURA: Low lung volumes. No focal pulmonary opacity. No pulmonary edema. No pleural effusion. No pneumothorax. HEART AND MEDIASTINUM: Unchanged cardiomediastinal silhouette.  Atherosclerotic plaque. BONES AND SOFT TISSUES: No acute osseous abnormality. IMPRESSION: 1. Low lung volumes with no acute cardiopulmonary process. Electronically signed by: Morgane Naveau MD 10/29/2023 08:28 PM EDT RP Workstation: [id number]     .Critical Care  Performed by: Beverley Leita LABOR, PA-C Authorized by: Beverley Leita LABOR, PA-C   Critical care provider statement:    Critical care time (minutes):  30   Critical care was time spent personally by me on the following activities:  Development of treatment plan with patient or surrogate, discussions with consultants, evaluation of patient's response to treatment, examination of patient, ordering and review of laboratory studies, ordering and review of radiographic studies,  ordering and performing treatments and interventions, pulse oximetry, re-evaluation of patient's condition and review of old charts    Medications Ordered in the ED  sodium chloride  0.9 % bolus 1,000 mL (1,000 mLs Intravenous New Bag/Given 10/29/23 2031)  sodium chloride  0.9 % bolus 1,000 mL (1,000 mLs Intravenous New Bag/Given 10/29/23 2206)  lactulose  (CHRONULAC ) 10 GM/15ML solution 20 g (20 g Oral Given 10/29/23 2206)  cefTRIAXone  (ROCEPHIN ) 1 g in sodium chloride  0.9 % 100 mL IVPB (1 g Intravenous New Bag/Given 10/29/23 2227)                                    Medical Decision Making Amount and/or Complexity of Data Reviewed Labs: ordered. Radiology: ordered.   This patient presents to the ED for concern of altered mental status, this involves an extensive number of treatment options, and is a complaint that carries with it a high risk of complications and morbidity.  The differential diagnosis includes hepatic encephalopathy, urosepsis, electrolyte metabolic dysfunction.   Co morbidities / Chronic conditions that complicate the patient evaluation  Cirrhosis, diabetes, hypertension, DVT, PE, additional history as reviewed in chart.   Additional history  obtained:  Additional history obtained from EMR Additional history from son at bedside. External records from outside source obtained and reviewed including prior labs imaging of   Lab Tests:  I Ordered, and personally interpreted labs.  The pertinent results include: CBC with mild episodes of a 12.8.  CMP with AKI, creatinine previously 0.8, now 1.57.  Lactic acid elevated at 3.1, repeat now 2.3.  INR pending.  Ammonia elevated at 67.   Imaging Studies ordered:  I ordered imaging studies including CT abdomen pelvis, chest x-ray, CT head I independently visualized and interpreted imaging which showed chest x-ray without acute process.  CT negative for acute process.  CT head pending. I agree with the radiologist interpretation   Cardiac Monitoring: / EKG:  The patient was maintained on a cardiac monitor.  I personally viewed and interpreted the cardiac monitored which showed an underlying rhythm of: Sinus tachycardia, rate 108   Problem List / ED Course / Critical interventions / Medication management  68 year old female brought in by son from home with concern for confusion today.  History of hepatic encephalopathy, similar to prior.  Son notes that she is also complaining of a headache today which is atypical for her.  No reports of fall or injury.  Also generalized body itching with increased thirst and urinary frequency.  No fevers.  Patient recently discharged from the hospital for her hepatic encephalopathy.  Lactic was elevated at 3.1, improved to 2.3 on recheck after IV fluids.  Ammonia is somewhat elevated at 67 and provided with lactulose .  CMP with AKI creatinine 1.7 with GFR of 36, previously creatinine was 0.8 at discharge.  White count elevated at 12.8.  Patient is afebrile.  Discussed with hospitalist who will consult for admission.  CT abdomen pelvis without significant findings.  CT head pending at time of admission. I ordered medication including rocephin , IVF    Reevaluation of the patient after these medicines showed that the patient lactic improved, vitals stable  I have reviewed the patients home medicines and have made adjustments as needed   Consultations Obtained:  I requested consultation with the hospitalist, Dr. Sim,  and discussed lab and imaging findings as well as pertinent plan - they recommend: will consult to admit.  Social Determinants of Health:  Lives with son who is her caretaker    Test / Admission - Considered:  admit      Final diagnoses:  Hepatic encephalopathy [facility])    ED Discharge Orders     None          Beverley Leita LABOR, PA-C 10/29/23 2325    Horton, Roxie HERO, DO 10/29/23 2343

## 2023-10-29 NOTE — ED Triage Notes (Signed)
 Patient c/o RLQ abdominal pain started this afternoon. Family report patient not been well and increase confusion since discharge 4 days ago. Patient c/o dysuria. Patient report nausea and vomiting x 3 tonight.

## 2023-10-29 NOTE — H&P (Signed)
 History and Physical    Patient: [name] FMW:[id number] DOB: [birth year] DOA: 10/29/2023 DOS: the patient was seen and examined on 10/29/2023 PCP: [name], MD  Patient coming from: Home  Chief Complaint:  Chief Complaint  Patient presents with   Abdominal Pain   HPI: [name] is a 68 y.o. female with medical history significant of [name], recurrent hepatic encephalopathy, short gut syndrome, chronic diarrhea, CKD 3A, anemia of chronic disease, chronic anxiety with depression, hypothyroidism, recurrent hypotensive episode, hyperlipidemia, GERD, history of kidney stones, history of pulmonary emboli who presents to the ER today complaining of confusion.  Patient is here with her son who is her primary caregiver.  Her son symptoms started suddenly.  In the past when the symptoms come suddenly was due to UTI or cellulitis.  She has been scratching a lot.  Patient seen in the ER and is oriented x 1.  She has been taking her lactulose  and rifaximin  according to the son.  Ammonia level is only mildly elevated.  At this point patient has been admitted with suspicion of recurrent hepatic encephalopathy.  Review of Systems: As mentioned in the history of present illness. All other systems reviewed and are negative. 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   Hyperammonemia 09/30/2022   Hypertension    Kidney stone    Leukoma of left eye  12/01/2022   Obesity    Pulmonary emboli (HCC) 01/10/2009   Pulmonary embolism (HCC)    Short gut syndrome    Thyroid  disease    Past Surgical History:  Procedure Laterality Date   ABDOMINAL WALL MESH  REMOVAL     2016   CESAREAN SECTION WITH BILATERAL TUBAL LIGATION  04/30/1985   CHOLECYSTECTOMY  05/2007   Exploratory laparotomy, lysis of adhesions, takedown of ileostomy with small bowel resection, open cholecystectomy and ileocolostomy   COLON SURGERY  01/2007   exploratory laparotomy with right colecotmy and end ileostomy   COLONOSCOPY  04/02/2019   Dr Gunnar Chris Daniels, MD [facility]   ESOPHAGOGASTRODUODENOSCOPY (EGD) WITH PROPOFOL  N/A 10/19/2022   Procedure: ESOPHAGOGASTRODUODENOSCOPY (EGD) WITH PROPOFOL ;  Surgeon: Saintclair Jasper, MD;  Location: [facility];  Service: Gastroenterology;  Laterality: N/A;   ESOPHAGOSCOPY  04/02/2019   EYE SURGERY Left 10/06/2020   Cataract extraciton w/intraocular lens implant- Dr Caresse   HERNIA REPAIR  10/2007   ILEOSTOMY  2009   states bowel perfotation wih colonsocopy   ILEOSTOMY CLOSURE  2009   INCISIONAL HERNIA REPAIR  10/11/2018   IR KYPHO THORACIC WITH BONE BIOPSY  08/31/2022   IR KYPHO THORACIC WITH BONE BIOPSY  10/27/2022   IR RADIOLOGIST EVAL & MGMT  09/27/2022   KIDNEY STONE SURGERY     LIVER RESECTION     2009   PANNICULECTOMY     repair of abdominal wall hernia   WRIST SURGERY     tendonitis   Social History:  reports that she quit smoking about 38  years ago. Her smoking use included cigarettes. She has been exposed to tobacco smoke. She has never used smokeless tobacco. She reports that she does not drink alcohol and does not use drugs.  Allergies  Allergen Reactions   Ace Inhibitors Anaphylaxis and Swelling   Gadolinium Derivatives Anaphylaxis   Ivp Dye [Iodinated Contrast Media] Anaphylaxis   Wound Dressing Adhesive Dermatitis   Desitin [Zinc  Oxide] Rash and Other (See Comments)    Worsens rash   Yellow Jacket Venom  Swelling   Chlorhexidine  Rash and Other (See Comments)    WORSENS RASHES   Doxycycline Rash   Penicillins Dermatitis and Rash   Zofran  [Ondansetron ] Rash    Family History  Problem Relation Age of Onset   Uterine cancer Mother    Bone cancer Father    Heart disease Father    Hyperlipidemia Father    Dementia Father    Hypertension Father    Skin cancer Sister    Hypertension Sister    Diabetes Sister    Hyperlipidemia Brother    Hypertension Brother    Heart disease Brother     Prior to Admission medications   Medication Sig Start Date End Date Taking? Authorizing Provider  albuterol  (VENTOLIN  HFA) 108 (90 Base) MCG/ACT inhaler Inhale 2 puffs into the lungs every 6 (six) hours as needed for shortness of breath or wheezing. 02/13/23   [provider]  atorvastatin  (LIPITOR) 40 MG tablet TAKE 1 TABLET(40 MG) BY MOUTH IN THE MORNING 09/18/23   [name], MD  copper  tablet Take 2 mg by mouth in the morning and at bedtime.    [provider]  cyanocobalamin  (VITAMIN B12) 1000 MCG tablet Take 2,000 mcg by mouth every Monday, Wednesday, and Friday.    [provider]  dicyclomine  (BENTYL ) 10 MG capsule Take 1 capsule (10 mg total) by mouth 3 (three) times daily as needed for spasms (abdominal cramping). 08/07/23   [name], MD  EPINEPHrine  0.3 mg/0.3 mL IJ SOAJ injection Inject 0.3 mg into the muscle as needed for anaphylaxis. 07/27/23   [name], MD  famotidine  (PEPCID ) 20 MG tablet Take 20 mg by mouth daily as needed for heartburn or indigestion.    [provider]  furosemide  (LASIX ) 40 MG tablet Take 1 tablet (40 mg total) by mouth 2 (two) times daily. 10/25/23   [name], MD  HYDROcodone -acetaminophen  (NORCO) 10-325 MG tablet Take 1 tablet by mouth every 8 (eight) hours as needed (for pain). 06/15/23   [provider]  IRON -VITAMIN C  PO Take 1 tablet by mouth daily with breakfast.    [provider]  lactulose   (CHRONULAC ) 10 GM/15ML solution Take 45 mLs (30 g total) by mouth 4 (four) times daily. 10/23/23   Barbarann Nest, MD  levothyroxine  (SYNTHROID ) 175 MCG tablet TAKE 1 TABLET BY MOUTH DAILY A 6AM. RECHECK TSH WITH PCP IN 4 WEEKS 09/20/23   [name], MD  magnesium  gluconate (MAGONATE) 500 (27 Mg) MG TABS tablet Take 1 tablet (500 mg total) by mouth in the morning and at bedtime. 05/01/23   [name], MD  megestrol  (MEGACE ) 40 MG tablet Take 1 tablet (40 mg total) by mouth 2 (two) times daily. 05/04/23   Ajewole, [name], MD  methocarbamol  (ROBAXIN ) 500 MG tablet Take 500 mg by mouth in the morning.    [provider]  Multiple Vitamins-Minerals (CENTRUM SILVER ULTRA WOMENS PO) Take 1 tablet by mouth in the morning.    [provider]  naloxone (  NARCAN) nasal spray 4 mg/0.1 mL Place 1 spray into the nose as needed (accidental overdose). 03/25/22   [provider]  nystatin  cream (MYCOSTATIN ) Apply 1 Application topically 2 (two) times daily as needed for dry skin. 05/29/23   Cheryle Page, MD  pantoprazole  (PROTONIX ) 40 MG tablet Take 1 tablet (40 mg total) by mouth 2 (two) times daily before a meal. 06/09/23   [name], MD  potassium chloride  (KLOR-CON ) 10 MEQ tablet Take 2 tablets (20 mEq total) by mouth 2 (two) times daily. 08/07/23   [name], MD  rifaximin  (XIFAXAN ) 550 MG TABS tablet Take 550 mg by mouth 2 (two) times daily.    [provider]  rOPINIRole  (REQUIP ) 0.25 MG tablet TAKE 1 TABLET(0.25 MG) BY MOUTH AT BEDTIME 06/10/23   [name], MD  senna-docusate (SENOKOT-S) 8.6-50 MG tablet Take 1 tablet by mouth 2 (two) times daily. 10/26/23   [name], MD  spironolactone  (ALDACTONE ) 25 MG tablet Take 0.5 tablets (12.5 mg total) by mouth daily. 10/24/23   Barbarann Nest, MD  triamcinolone  cream (KENALOG ) 0.1 % Apply 1 Application topically 2 (two) times daily as needed (for itching- affected areas).    [provider]   venlafaxine  XR (EFFEXOR -XR) 75 MG 24 hr capsule Take 1 capsule (75 mg total) by mouth daily with breakfast. 07/20/23   [name], MD  Vitamin D , Ergocalciferol , (DRISDOL) 50000 UNITS CAPS Take 50,000 Units by mouth every Wednesday.    [provider]    Physical Exam: Vitals:   10/29/23 1945 10/29/23 1948 10/29/23 1949 10/29/23 2051  BP: (!) 139/93   114/69  Pulse: (!) 138 68 (!) 131 99  Resp:    19  Temp:    98.5 F (36.9 C)  SpO2: 97%  98% 96%   Constitutional: Chronically ill looking, NAD, calm, comfortable Eyes: PERRL, lids and conjunctivae normal ENMT: Mucous membranes are dry. Posterior pharynx clear of any exudate or lesions.Normal dentition.  Neck: normal, supple, no masses, no thyromegaly Respiratory: clear to auscultation bilaterally, no wheezing, no crackles. Normal respiratory effort. No accessory muscle use.  Cardiovascular: Sinus tachycardia, no murmurs / rubs / gallops. No extremity edema. 2+ pedal pulses. No carotid bruits.  Abdomen: no tenderness, no masses palpated. No hepatosplenomegaly. Bowel sounds positive.  Musculoskeletal: Good range of motion, no joint swelling or tenderness, Skin: no rashes, lesions, ulcers. No induration Neurologic: CN 2-12 grossly intact. Sensation intact, DTR normal. Strength 5/5 in all 4.  Psychiatric: Normal judgment and insight. Alert and oriented x 3. Normal mood  Data Reviewed:  Temperature 98.5, blood pressure 113/93, pulse 138 respirate 19, white count is 12.8 hemoglobin 14.  Platelets 131 sodium 140 potassium 3.4 chloride 112.  Creatinine is 1.57 with a BUN of 29 and CO2 of 15.  Lactic acid 3.1.  Ammonia level 67 CT abdomen pelvis showed no new findings except for 4 mm right renal calculus nonobstructive.  Head CT without contrast is negative and chest x-ray showed no acute findings.  Assessment and Plan:  #1 acute hepatic encephalopathy: Recurrent presentation.  Suspected secondary to some triggering by some  infectious source.  At this point we will assume possibly UTI.  Urinalysis pending.  Admit the patient.  Continue rifaximin  and lactulose .  Patient already has chronic diarrhea.  Once UA is available if confirmed to be infectious initiate antibiotics.  She is not febrile and no evidence of sepsis except for lactic acidosis.  This could be from volume contraction.  #2 NASH cirrhosis:  Continue supportive care.  #3 chronic thrombocytopenia: At baseline.  Continue monitoring  #4 AKI: On CKD 3.  Continue to monitor  #5 mild hypokalemia: Continue to replete potassium  #6 GERD: Continue with PPIs  #7 hypothyroidism: Continue with levothyroxine     Advance Care Planning:   Code Status: Limited: Do not attempt resuscitation (DNR) -DNR-LIMITED -Do Not Intubate/DNI    Consults: None  Family Communication: Son at bedside  Severity of Illness: The appropriate patient status for this patient is INPATIENT. Inpatient status is judged to be reasonable and necessary in order to provide the required intensity of service to ensure the patient's safety. The patient's presenting symptoms, physical exam findings, and initial radiographic and laboratory data in the context of their chronic comorbidities is felt to place them at high risk for further clinical deterioration. Furthermore, it is not anticipated that the patient will be medically stable for discharge from the hospital within 2 midnights of admission.   * I certify that at the point of admission it is my clinical judgment that the patient will require inpatient hospital care spanning beyond 2 midnights from the point of admission due to high intensity of service, high risk for further deterioration and high frequency of surveillance required.*  AuthorBETHA SIM KNOLL, MD 10/29/2023 11:04 PM  For on call review [email or web address].

## 2023-10-30 ENCOUNTER — Encounter (HOSPITAL_COMMUNITY): Payer: Self-pay | Admitting: Internal Medicine

## 2023-10-30 ENCOUNTER — Telehealth: Payer: Self-pay

## 2023-10-30 DIAGNOSIS — K7581 Nonalcoholic steatohepatitis (NASH): Secondary | ICD-10-CM | POA: Diagnosis not present

## 2023-10-30 DIAGNOSIS — K7469 Other cirrhosis of liver: Secondary | ICD-10-CM | POA: Diagnosis not present

## 2023-10-30 LAB — URINALYSIS, W/ REFLEX TO CULTURE (INFECTION SUSPECTED)
Bacteria, UA: NONE SEEN
Bilirubin Urine: NEGATIVE
Glucose, UA: NEGATIVE mg/dL
Hgb urine dipstick: NEGATIVE
Ketones, ur: NEGATIVE mg/dL
Nitrite: NEGATIVE
Protein, ur: NEGATIVE mg/dL
Specific Gravity, Urine: 1.018 (ref 1.005–1.030)
pH: 5 (ref 5.0–8.0)

## 2023-10-30 LAB — COMPREHENSIVE METABOLIC PANEL WITH GFR
ALT: 25 U/L (ref 0–44)
AST: 34 U/L (ref 15–41)
Albumin: 2.7 g/dL — ABNORMAL LOW (ref 3.5–5.0)
Alkaline Phosphatase: 79 U/L (ref 38–126)
Anion gap: 13 (ref 5–15)
BUN: 30 mg/dL — ABNORMAL HIGH (ref 8–23)
CO2: 14 mmol/L — ABNORMAL LOW (ref 22–32)
Calcium: 8.3 mg/dL — ABNORMAL LOW (ref 8.9–10.3)
Chloride: 113 mmol/L — ABNORMAL HIGH (ref 98–111)
Creatinine, Ser: 1.35 mg/dL — ABNORMAL HIGH (ref 0.44–1.00)
GFR, Estimated: 43 mL/min — ABNORMAL LOW (ref >60)
Glucose, Bld: 93 mg/dL (ref 70–99)
Potassium: 3.8 mmol/L (ref 3.5–5.1)
Sodium: 140 mmol/L (ref 135–145)
Total Bilirubin: 1.6 mg/dL — ABNORMAL HIGH (ref 0.0–1.2)
Total Protein: 4.5 g/dL — ABNORMAL LOW (ref 6.5–8.1)

## 2023-10-30 LAB — BLOOD CULTURE ID PANEL (REFLEXED) - BCID2

## 2023-10-30 LAB — CBC
HCT: 34.1 % — ABNORMAL LOW (ref 36.0–46.0)
Hemoglobin: 10.8 g/dL — ABNORMAL LOW (ref 12.0–15.0)
MCH: 31.5 pg (ref 26.0–34.0)
MCHC: 31.7 g/dL (ref 30.0–36.0)
MCV: 99.4 fL (ref 80.0–100.0)
Platelets: 80 K/uL — ABNORMAL LOW (ref 150–400)
RBC: 3.43 MIL/uL — ABNORMAL LOW (ref 3.87–5.11)
RDW: 16.1 % — ABNORMAL HIGH (ref 11.5–15.5)
WBC: 11.6 K/uL — ABNORMAL HIGH (ref 4.0–10.5)
nRBC: 0 % (ref 0.0–0.2)

## 2023-10-30 LAB — PROTIME-INR
INR: 2.1 — ABNORMAL HIGH (ref 0.8–1.2)
Prothrombin Time: 24.6 s — ABNORMAL HIGH (ref 11.4–15.2)

## 2023-10-30 MED ORDER — SODIUM CHLORIDE 0.9 % IV SOLN: INTRAVENOUS | Status: DC

## 2023-10-30 MED ORDER — PANTOPRAZOLE SODIUM 40 MG PO TBEC
40.0000 mg | DELAYED_RELEASE_TABLET | Freq: Two times a day (BID) | ORAL | Status: DC
  Administered 2023-10-30 – 2023-11-03 (×9): 40 mg via ORAL
  Filled 2023-10-30 (×9): qty 1

## 2023-10-30 MED ORDER — DICYCLOMINE HCL 10 MG PO CAPS: 10.0000 mg | ORAL_CAPSULE | Freq: Three times a day (TID) | ORAL | Status: DC | PRN

## 2023-10-30 MED ORDER — VANCOMYCIN HCL 2000 MG/400ML IV SOLN
2000.0000 mg | Freq: Once | INTRAVENOUS | Status: AC
  Administered 2023-10-30: 2000 mg via INTRAVENOUS
  Filled 2023-10-30: qty 400

## 2023-10-30 MED ORDER — LACTULOSE 10 GM/15ML PO SOLN
30.0000 g | Freq: Four times a day (QID) | ORAL | Status: DC
Start: 2023-10-30 — End: 2023-11-03
  Administered 2023-10-30 – 2023-11-01 (×9): 30 g via ORAL
  Filled 2023-10-30 (×2): qty 45
  Filled 2023-10-30 (×2): qty 60
  Filled 2023-10-30 (×7): qty 45

## 2023-10-30 MED ORDER — LEVOTHYROXINE SODIUM 125 MCG PO TABS
175.0000 ug | ORAL_TABLET | Freq: Every day | ORAL | Status: DC
  Administered 2023-10-30 – 2023-11-03 (×5): 175 ug via ORAL
  Filled 2023-10-30 (×5): qty 1

## 2023-10-30 MED ORDER — ATORVASTATIN CALCIUM 40 MG PO TABS
40.0000 mg | ORAL_TABLET | Freq: Every day | ORAL | Status: DC
  Administered 2023-10-30 – 2023-11-03 (×5): 40 mg via ORAL
  Filled 2023-10-30 (×5): qty 1

## 2023-10-30 MED ORDER — MAGNESIUM GLUCONATE 500 (27 MG) MG PO TABS
500.0000 mg | ORAL_TABLET | Freq: Every day | ORAL | Status: DC
  Administered 2023-10-30 – 2023-11-03 (×5): 500 mg via ORAL
  Filled 2023-10-30 (×5): qty 1

## 2023-10-30 MED ORDER — METHOCARBAMOL 500 MG PO TABS
500.0000 mg | ORAL_TABLET | Freq: Every morning | ORAL | Status: DC
  Administered 2023-10-30 – 2023-11-03 (×5): 500 mg via ORAL
  Filled 2023-10-30 (×5): qty 1

## 2023-10-30 MED ORDER — VITAMIN C 500 MG PO TABS
250.0000 mg | ORAL_TABLET | Freq: Every day | ORAL | Status: DC
  Administered 2023-10-30 – 2023-11-03 (×5): 250 mg via ORAL
  Filled 2023-10-30 (×5): qty 1

## 2023-10-30 MED ORDER — ROPINIROLE HCL 0.25 MG PO TABS
0.2500 mg | ORAL_TABLET | Freq: Every day | ORAL | Status: DC
Start: 2023-10-30 — End: 2023-11-03
  Administered 2023-10-30 – 2023-11-02 (×4): 0.25 mg via ORAL
  Filled 2023-10-30 (×4): qty 1

## 2023-10-30 MED ORDER — VANCOMYCIN HCL 750 MG/150ML IV SOLN
750.0000 mg | INTRAVENOUS | Status: DC
  Administered 2023-10-31: 750 mg via INTRAVENOUS
  Filled 2023-10-30: qty 150

## 2023-10-30 MED ORDER — ALBUTEROL SULFATE HFA 108 (90 BASE) MCG/ACT IN AERS
2.0000 | INHALATION_SPRAY | Freq: Four times a day (QID) | RESPIRATORY_TRACT | Status: DC | PRN
Start: 2023-10-30 — End: 2023-10-30

## 2023-10-30 MED ORDER — HYDROCODONE-ACETAMINOPHEN 10-325 MG PO TABS
1.0000 | ORAL_TABLET | Freq: Three times a day (TID) | ORAL | Status: DC | PRN
  Administered 2023-10-30 – 2023-11-02 (×4): 1 via ORAL
  Filled 2023-10-30 (×5): qty 1

## 2023-10-30 MED ORDER — SENNOSIDES-DOCUSATE SODIUM 8.6-50 MG PO TABS
1.0000 | ORAL_TABLET | Freq: Two times a day (BID) | ORAL | Status: DC
  Administered 2023-10-30 – 2023-11-03 (×8): 1 via ORAL
  Filled 2023-10-30 (×9): qty 1

## 2023-10-30 MED ORDER — TRIAMCINOLONE ACETONIDE 0.1 % EX CREA: 1.0000 | TOPICAL_CREAM | Freq: Two times a day (BID) | CUTANEOUS | Status: DC | PRN

## 2023-10-30 MED ORDER — VITAMIN B-12 1000 MCG PO TABS
2000.0000 ug | ORAL_TABLET | ORAL | Status: DC
  Administered 2023-10-30 – 2023-11-03 (×3): 2000 ug via ORAL
  Filled 2023-10-30 (×4): qty 2

## 2023-10-30 MED ORDER — VENLAFAXINE HCL ER 75 MG PO CP24
75.0000 mg | ORAL_CAPSULE | Freq: Every day | ORAL | Status: DC
  Administered 2023-10-30 – 2023-11-03 (×5): 75 mg via ORAL
  Filled 2023-10-30 (×5): qty 1

## 2023-10-30 MED ORDER — MEGESTROL ACETATE 40 MG PO TABS
40.0000 mg | ORAL_TABLET | Freq: Two times a day (BID) | ORAL | Status: DC
  Administered 2023-10-30 – 2023-11-03 (×9): 40 mg via ORAL
  Filled 2023-10-30 (×10): qty 1

## 2023-10-30 MED ORDER — FERROUS SULFATE 325 (65 FE) MG PO TABS
325.0000 mg | ORAL_TABLET | Freq: Every day | ORAL | Status: DC
  Administered 2023-10-30 – 2023-11-03 (×5): 325 mg via ORAL
  Filled 2023-10-30 (×4): qty 1

## 2023-10-30 MED ORDER — FAMOTIDINE 20 MG PO TABS: 20.0000 mg | ORAL_TABLET | Freq: Every day | ORAL | Status: DC | PRN

## 2023-10-30 MED ORDER — SPIRONOLACTONE 12.5 MG HALF TABLET
12.5000 mg | ORAL_TABLET | Freq: Every day | ORAL | Status: DC
  Administered 2023-10-30 – 2023-10-31 (×2): 12.5 mg via ORAL
  Filled 2023-10-30 (×2): qty 1

## 2023-10-30 MED ORDER — ALBUTEROL SULFATE (2.5 MG/3ML) 0.083% IN NEBU: 2.5000 mg | INHALATION_SOLUTION | Freq: Four times a day (QID) | RESPIRATORY_TRACT | Status: DC | PRN

## 2023-10-30 MED ORDER — FUROSEMIDE 40 MG PO TABS
40.0000 mg | ORAL_TABLET | Freq: Two times a day (BID) | ORAL | Status: DC
  Administered 2023-10-30 – 2023-11-03 (×9): 40 mg via ORAL
  Filled 2023-10-30 (×9): qty 1

## 2023-10-30 MED ORDER — IRON-VITAMIN C 100-250 MG PO TABS: ORAL_TABLET | Freq: Every day | ORAL | Status: DC

## 2023-10-30 MED ORDER — RIFAXIMIN 550 MG PO TABS
550.0000 mg | ORAL_TABLET | Freq: Two times a day (BID) | ORAL | Status: DC
  Administered 2023-10-30 – 2023-11-03 (×9): 550 mg via ORAL
  Filled 2023-10-30 (×9): qty 1

## 2023-10-30 MED ORDER — NYSTATIN 100000 UNIT/GM EX CREA
1.0000 | TOPICAL_CREAM | Freq: Two times a day (BID) | CUTANEOUS | Status: DC | PRN
  Filled 2023-10-30: qty 30

## 2023-10-30 MED ORDER — VITAMIN D (ERGOCALCIFEROL) 1.25 MG (50000 UNIT) PO CAPS
50000.0000 [IU] | ORAL_CAPSULE | ORAL | Status: DC
  Administered 2023-11-01: 50000 [IU] via ORAL
  Filled 2023-10-30: qty 1

## 2023-10-30 MED ORDER — SODIUM CHLORIDE 0.9 % IV BOLUS
500.0000 mL | Freq: Once | INTRAVENOUS | Status: AC
  Administered 2023-10-30: 500 mL via INTRAVENOUS

## 2023-10-30 MED ORDER — POTASSIUM CHLORIDE CRYS ER 10 MEQ PO TBCR
20.0000 meq | EXTENDED_RELEASE_TABLET | Freq: Two times a day (BID) | ORAL | Status: DC
  Administered 2023-10-30 – 2023-11-03 (×9): 20 meq via ORAL
  Filled 2023-10-30 (×12): qty 2

## 2023-10-30 NOTE — ED Notes (Signed)
 Assisted pt to the bedside commode and back to bed. Placed new Purewick on. No other needs.

## 2023-10-30 NOTE — ED Notes (Signed)
Pt son

## 2023-10-30 NOTE — ED Notes (Addendum)
 Patient BP dropped to 85/50. N.P. Informed. bolus given. Will continue to monitor.

## 2023-10-30 NOTE — Progress Notes (Signed)
 PHARMACY - PHYSICIAN COMMUNICATION CRITICAL VALUE ALERT - BLOOD CULTURE IDENTIFICATION (BCID)  [name] is an 68 y.o. female who presented to [facility] on 10/29/2023 with a chief complaint of RLQ abdominal pain.  Assessment:  Admitted with acute hepatic encephalopathy and suspected UTI.  Received dose of Rocephin  on admission but antibiotics were not continued since she was afebrile.  Lactic acid elevated.  Micro now reporting 4/4 blood cx bottles + GPC; BCID + Staphylococcus epidermidis, methicillin resistance detected.    Name of physician (or Provider) Contacted: JINNY Kipper, NP  Current antibiotics: none  Changes to prescribed antibiotics recommended:  -Add Vancomycin  per RX protocol  >2gm IV x1 then 750mg  IV q24h for estimated AUC 502 (target 400-550) -Repeat blood cx with morning labs  Results for orders placed or performed during the hospital encounter of 04/05/23  Blood Culture ID Panel (Reflexed) (Collected: 04/05/2023 11:24 PM)  Result Value Ref Range   Enterococcus faecalis NOT DETECTED NOT DETECTED   Enterococcus Faecium NOT DETECTED NOT DETECTED   Listeria monocytogenes NOT DETECTED NOT DETECTED   Staphylococcus species NOT DETECTED NOT DETECTED   Staphylococcus aureus (BCID) NOT DETECTED NOT DETECTED   Staphylococcus epidermidis NOT DETECTED NOT DETECTED   Staphylococcus lugdunensis NOT DETECTED NOT DETECTED   Streptococcus species NOT DETECTED NOT DETECTED   Streptococcus agalactiae NOT DETECTED NOT DETECTED   Streptococcus pneumoniae NOT DETECTED NOT DETECTED   Streptococcus pyogenes NOT DETECTED NOT DETECTED   A.calcoaceticus-baumannii NOT DETECTED NOT DETECTED   Bacteroides fragilis NOT DETECTED NOT DETECTED   Enterobacterales NOT DETECTED NOT DETECTED   Enterobacter cloacae complex NOT DETECTED NOT DETECTED   Escherichia coli NOT DETECTED NOT DETECTED   Klebsiella aerogenes NOT DETECTED NOT DETECTED   Klebsiella oxytoca NOT DETECTED NOT DETECTED    Klebsiella pneumoniae NOT DETECTED NOT DETECTED   Proteus species NOT DETECTED NOT DETECTED   Salmonella species NOT DETECTED NOT DETECTED   Serratia marcescens NOT DETECTED NOT DETECTED   Haemophilus influenzae NOT DETECTED NOT DETECTED   Neisseria meningitidis NOT DETECTED NOT DETECTED   Pseudomonas aeruginosa NOT DETECTED NOT DETECTED   Stenotrophomonas maltophilia NOT DETECTED NOT DETECTED   Candida albicans NOT DETECTED NOT DETECTED   Candida auris NOT DETECTED NOT DETECTED   Candida glabrata NOT DETECTED NOT DETECTED   Candida krusei NOT DETECTED NOT DETECTED   Candida parapsilosis NOT DETECTED NOT DETECTED   Candida tropicalis NOT DETECTED NOT DETECTED   Cryptococcus neoformans/gattii NOT DETECTED NOT DETECTED    Rosaline Millet PharmD 10/30/2023  9:42 PM

## 2023-10-30 NOTE — Progress Notes (Signed)
  Progress Note   Patient: [name] FMW:[id number] DOB: 29-Jul-1955 DOA: 10/29/2023     1 DOS: the patient was seen and examined on 10/30/2023   Brief hospital course:   68 y.o. female with medical history significant of [name], recurrent hepatic encephalopathy, short gut syndrome, chronic diarrhea, CKD 3A, anemia of chronic disease, chronic anxiety with depression, hypothyroidism, recurrent hypotensive episode, hyperlipidemia, GERD, history of kidney stones, history of pulmonary emboli who presents to the ER today complaining of confusion and is being treated for possible hepatic encephalopathy in the setting of NASH cirrhosis.  Assessment and Plan:  Acute hepatic encephalopathy: Improved. Recurrent presentation.  Suspected secondary to some triggering by some infectious source.  At this point we will assume possibly UTI.  Urinalysis pending.  Continue rifaximin  and lactulose  along with lasix  40 mg BID and aldactone  12.5 mg daily.   She is not febrile and no evidence of sepsis except for lactic acidosis.   NGTD on blood cultures I personally reviewed CT Head which didn't show any acute abnormalities.    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  Continue monitoring   AK: Improving.  Continue to monitor   Mild hypokalemia: Continue to replete potassium as needed   GERD: Continue with PPIs   Hypothyroidism: Continue with levothyroxine   Disposition: Home. Lives with her son.      Subjective: No acute events overnight. She is complaining of back and neck pain. She lives at home with her son. She said that she was diagnosed with cirrhosis almost 3 years back and has been compliant with her meds.  Physical Exam: Vitals:   10/30/23 0745 10/30/23 0800 10/30/23 0815 10/30/23 0830  BP:  (!) 98/52  (!) 100/54  Pulse: 89 88 88 88  Resp:      Temp:       SpO2: 99% 98% 98% 99%   Constitutional: appears to be in mild distress Eyes: PERRL, lids and conjunctivae normal ENMT: Mucous membranes are moist. Posterior pharynx clear of any exudate or lesions.Normal dentition.  Neck: normal, supple, no masses, no thyromegaly Respiratory: clear to auscultation bilaterally, no wheezing, no crackles. Normal respiratory effort. No accessory muscle use.  Cardiovascular: Regular rate and rhythm, no murmurs / rubs / gallops. No extremity edema. 2+ pedal pulses. No carotid bruits.  Abdomen: no tenderness, no masses palpated. No hepatosplenomegaly. Bowel sounds positive.  Musculoskeletal: no clubbing / cyanosis. No joint deformity upper and lower extremities. Good ROM, no contractures. Normal muscle tone.  Skin: no rashes, lesions, ulcers. No induration Neurologic: CN 2-12 grossly intact. Sensation intact, DTR normal. Strength 5/5 x all 4 extremities.  Psychiatric: Normal judgment and insight. Alert and oriented x 3. Normal mood.   Data Reviewed:  There are no new results to review at this time.  Family Communication: None at the bedside  Disposition: Status is: Inpatient Remains inpatient appropriate because: hepatic encephalopathy  Planned Discharge Destination: Home    Time spent: 40 minutes  Author: Deliliah Room, MD 10/30/2023 9:03 AM  For on call review [email or web address].

## 2023-10-30 NOTE — Telephone Encounter (Signed)
 Copied from CRM #[id number]. Topic: General - Other >> Oct 30, 2023  1:34 PM Sophia H wrote: Reason for CRM: Deane with [facility] physical therapy states she stopped by to do an eval and the patient was not home. She got in touch with the patients son who states the patient is currently addmitted to [facility] & has been there since late Saturday. Just fyi for the provider.   If any questions - [phone]

## 2023-10-30 NOTE — ED Notes (Signed)
 Emptied bedside commode; watery bm.

## 2023-10-31 ENCOUNTER — Inpatient Hospital Stay (HOSPITAL_COMMUNITY): Payer: Medicare (Managed Care)

## 2023-10-31 DIAGNOSIS — R7881 Bacteremia: Secondary | ICD-10-CM

## 2023-10-31 DIAGNOSIS — K7682 Hepatic encephalopathy: Secondary | ICD-10-CM | POA: Diagnosis not present

## 2023-10-31 LAB — GASTROINTESTINAL PANEL BY PCR, STOOL (REPLACES STOOL CULTURE)

## 2023-10-31 LAB — ECHOCARDIOGRAM COMPLETE
AR max vel: 1.12 cm2
AV Area VTI: 1.25 cm2
AV Area mean vel: 1.31 cm2
AV Mean grad: 29 mmHg
AV Peak grad: 62.4 mmHg
Ao pk vel: 3.95 m/s
Area-P 1/2: 3.93 cm2
Calc EF: 70.5 %
Height: 62 in
S' Lateral: 2.9 cm
Single Plane A2C EF: 69.3 %
Single Plane A4C EF: 73.7 %
Weight: 3365.1 [oz_av]

## 2023-10-31 LAB — URINE CULTURE: Culture: NO GROWTH

## 2023-10-31 MED ORDER — ACETAMINOPHEN 500 MG PO TABS
500.0000 mg | ORAL_TABLET | Freq: Three times a day (TID) | ORAL | Status: DC | PRN
  Administered 2023-10-31 (×2): 500 mg via ORAL
  Filled 2023-10-31 (×2): qty 1

## 2023-10-31 MED ORDER — MIDODRINE HCL 5 MG PO TABS
10.0000 mg | ORAL_TABLET | Freq: Three times a day (TID) | ORAL | Status: DC
  Administered 2023-10-31 – 2023-11-03 (×8): 10 mg via ORAL
  Filled 2023-10-31 (×8): qty 2

## 2023-10-31 MED ORDER — SPIRONOLACTONE 25 MG PO TABS
50.0000 mg | ORAL_TABLET | Freq: Every day | ORAL | Status: DC
  Administered 2023-11-01 – 2023-11-03 (×3): 50 mg via ORAL
  Filled 2023-10-31 (×3): qty 2

## 2023-10-31 MED ORDER — SPIRONOLACTONE 25 MG PO TABS
25.0000 mg | ORAL_TABLET | Freq: Every day | ORAL | Status: DC
Start: 2023-11-01 — End: 2023-10-31

## 2023-10-31 NOTE — Progress Notes (Signed)
 [facility] Progress Note Patient: [name] FMW:[id number] DOB: November 23, 1955  DOA: 10/29/2023 DOS: the patient was seen and examined on 10/31/2023  Brief Hospital Course: 68 y.o. female with medical history significant of [name], recurrent hepatic encephalopathy, short gut syndrome, chronic diarrhea, CKD 3A, anemia of chronic disease, chronic anxiety with depression, hypothyroidism, recurrent hypotensive episode, hyperlipidemia, GERD, history of kidney stones, history of pulmonary emboli who presents to the ER today complaining of confusion and is being treated for possible hepatic encephalopathy in the setting of NASH cirrhosis.   Assessment and Plan: Acute metabolic encephalopathy secondary to hepatic encephalopathy Ammonia level elevated on admission. Asterixis improving. Mentation significantly better near baseline from my prior assessment of the patient. Continue lactulose .  Continue rifaximin .   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   Headache.   Continue Norco or Tylenol .   Iron  deficiency anemia H&H relatively stable. Monitor blood Continue PPI.   Chronic hypotension Continue midodrine    Hypothyroidism Continue Synthroid    Chronic vaginal bleeding due to fibroids. Continue Megace  40 mg twice daily, Unsure what GYN's plan was since this patient has been on this medication for almost a year now.   Headache. Okay to take Tylenol . Continue as needed pain medication.   Obesity Class 2 Body mass index is 38.47 kg/m.  Placing the pt at higher risk of poor outcomes.  Hypokalemia. Treated.  Mild AKI. With monitor renal function.  Chronic thrombocytopenia  Secondary to cirrhosis. Monitor.   Subjective: Reports headache.  Reports back pain.  Reports chronic abdominal pain.  No fever no chills.  Had multiple loose watery bowel movement.  No  blood in the stool.  Physical Exam: Clear to auscultation. S1-S2 present Bowel sound present.  Diffusely tender. Trace edema. No rash seen on the skin.  Son reported some redness on left thigh which is unremarkable.  Data Reviewed: I have Reviewed nursing notes, Vitals, and Lab results. Since last encounter, pertinent lab results CBC and BMP   . I have ordered test including CBC and BMP  .   Disposition: Status is: Inpatient Remains inpatient appropriate because: Monitor for improvement in pain control and encephalopathy  SCDs Start: 10/30/23 0453   Family Communication: Son at bedside Level of care: Progressive   Vitals:   10/30/23 2345 10/31/23 0427 10/31/23 1118 10/31/23 1720  BP: (!) 103/50 120/62 115/65 125/79  Pulse: 86 89 86 81  Resp: 18 17    Temp: 97.8 F (36.6 C) (!) 97.5 F (36.4 C) 98 F (36.7 C) 98.5 F (36.9 C)  TempSrc: Oral Oral Oral Oral  SpO2: 100% 98% 100% 100%  Weight:      Height:         Author: Yetta Blanch, MD 10/31/2023 6:04 PM  Please look on [email or web address] to find out who is on call.

## 2023-10-31 NOTE — TOC Initial Note (Signed)
 Transition of Care [facility]) - Initial/Assessment Note   Patient Details  Name: [name] MRN: [id number] Date of Birth: Jul 05, 1955  Transition of Care [facility]) CM/SW Contact:    Duwaine GORMAN Aran, LCSW Phone Number: 10/31/2023, 8:38 AM  Clinical Narrative: Patient is from home with family. Patient was recently discharged on 10/23/23 and was set up with [facility] for PT/OT/RN/aide. Care management following for discharge needs.  Expected Discharge Plan: Home w Home Health Services Barriers to Discharge: Continued Medical Work up  Expected Discharge Plan and Services In-house Referral: Clinical Social Work Post Acute Care Choice: Home Health Living arrangements for the past 2 months: Single Family Home            DME Arranged: N/A DME Agency: NA HH Agency: [facility]  Prior Living Arrangements/Services Living arrangements for the past 2 months: Single Family Home Lives with:: Adult Children Patient language and need for interpreter reviewed:: Yes Do you feel safe going back to the place where you live?: Yes      Need for Family Participation in Patient Care: Yes (Comment) Care giver support system in place?: Yes (comment) Current home services: DME, Home OT, Home PT, Home RN, Homehealth aide (Active w/[facility] for [facility]. Has DME: Walker, rollator, wheelchair, shower chair, hospital bed, hoyer lift.) Criminal Activity/Legal Involvement Pertinent to Current Situation/Hospitalization: No - Comment as needed  Activities of Daily Living ADL Screening (condition at time of admission) Independently performs ADLs?: Yes (appropriate for developmental age) Is the patient deaf or have difficulty hearing?: No Does the patient have difficulty seeing, even when wearing glasses/contacts?: Yes Does the patient have difficulty concentrating, remembering, or making decisions?: Yes  Emotional Assessment Orientation: : Oriented to Self, Oriented to Place, Oriented to  Time Alcohol / Substance Use:  Not Applicable Psych Involvement: No (comment)  Admission diagnosis:  Hepatic encephalopathy (HCC) [K76.82] Patient Active Problem List   Diagnosis Date Noted   AKI (acute kidney injury) 10/23/2023   AMS (altered mental status) 10/08/2023   Arthralgia of left knee 09/08/2023   Acute gout 08/28/2023   Knee pain 07/03/2023   Hepatic encephalopathy (HCC) 06/29/2023   Chronic kidney disease, stage 3a (HCC) 06/28/2023   Hyperammonemia 05/23/2023   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) 05/23/2023   Chronic idiopathic thrombocytopenia (HCC) 05/23/2023   Hyperlipidemia 05/23/2023   Generalized anxiety disorder 05/23/2023   Chronic anemia 05/23/2023   Acute metabolic encephalopathy 05/23/2023   Copper  deficiency 04/24/2023   ARF (acute renal failure) 03/30/2023   Depression 03/30/2023   Restless leg syndrome 03/30/2023   Pruritus 03/06/2023   Edema of both lower legs 03/06/2023   Insomnia 03/06/2023   Compression fracture of body of thoracic vertebra (HCC) 03/06/2023   Anemia of chronic disease 02/22/2023   Lumbar radiculopathy 12/10/2022   Grade I diastolic dysfunction 12/10/2022   History of DVT (deep vein thrombosis) 12/01/2022   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   Acute hepatic encephalopathy (HCC) 10/17/2022   Hypomagnesemia 10/08/2022   DNR (do not resuscitate)/DNI(Do Not intubate) 10/05/2022   Vaginal bleeding 10/05/2022   Physical deconditioning 08/17/2022   Decompensated cirrhosis (HCC) 08/17/2022   Osteoarthritis of right knee 08/01/2022   Folate deficiency 06/14/2022   Systolic murmur 06/12/2022   Thrombocytopenia (HCC) - Baseline 100-130K 05/16/2022   History of eye surgery 05/01/2022   Corneal abnormality 04/29/2022   Status post eye surgery 04/29/2022   Corneal perforation of left eye 04/18/2022   Pseudophakia of left eye 02/08/2022  Physical debility 12/10/2021   Hypotension 11/24/2021   Liver cirrhosis secondary to NASH (HCC) 10/03/2021    Polypharmacy 06/22/2021   Former smoker 01/06/2021   Combined form of age-related cataract, right eye 10/01/2020   Corneal scarring 09/10/2020   Cerebral atherosclerosis 08/20/2020   Short gut syndrome 08/20/2020   DDD (degenerative disc disease), lumbar 08/20/2020   Hernia, ventral 03/06/2020   Splenomegaly 02/06/2019   Vitamin B12 deficiency 01/21/2019   Hyperuricemia 08/25/2016   GERD (gastroesophageal reflux disease) 07/25/2016   Hypothyroidism 07/25/2016   Iron  deficiency anemia 05/09/2016   Microcytic anemia 04/12/2016   Vitamin D  deficiency 05/04/2015   History of adenomatous polyp of colon 04/28/2015   H/O angioedema 11/15/2014   Obesity 07/25/2014   Chronic diarrhea 06/01/2014   Hypokalemia 06/01/2014   PCP:  [name], MD Pharmacy:   [facility] [phone] - [place], [place] - [address] AT [facility] [address] [place] [address] Phone: [phone] Fax: [phone]  Social Drivers of Health (SDOH) Social History: SDOH Screenings   Food Insecurity: No Food Insecurity (10/30/2023)  Housing: Low Risk  (10/30/2023)  Transportation Needs: No Transportation Needs (10/30/2023)  Utilities: Not At Risk (10/30/2023)  Alcohol Screen: Low Risk  (09/20/2023)  Depression (PHQ2-9): Low Risk  (10/24/2023)  Recent Concern: Depression (PHQ2-9) - Medium Risk (09/20/2023)  Financial Resource Strain: Low Risk  (09/20/2023)  Physical Activity: Inactive (09/20/2023)  Social Connections: Socially Isolated (10/30/2023)  Stress: No Stress Concern Present (09/20/2023)  Tobacco Use: Medium Risk (10/30/2023)  Health Literacy: Adequate Health Literacy (09/20/2023)   SDOH Interventions:    Readmission Risk Interventions    10/31/2023    8:35 AM 10/10/2023    2:52 PM 08/25/2023   10:04 AM  Readmission Risk Prevention Plan  Transportation Screening Complete Complete Complete  Medication Review Oceanographer) Complete Complete Complete  PCP or Specialist  appointment within 3-5 days of discharge  Complete   HRI or Home Care Consult Complete Complete Complete  SW Recovery Care/Counseling Consult Complete Complete Complete  Palliative Care Screening Not Applicable Not Applicable Not Applicable  Skilled Nursing Facility Not Applicable Not Applicable Not Applicable

## 2023-10-31 NOTE — Progress Notes (Addendum)
 0700 -1900 patient has had 8 watery stools

## 2023-10-31 NOTE — Plan of Care (Signed)
  Problem: Education: Goal: Knowledge of General Education information will improve Description: Including pain rating scale, medication(s)/side effects and non-pharmacologic comfort measures Outcome: Not Progressing   Problem: Health Behavior/Discharge Planning: Goal: Ability to manage health-related needs will improve Outcome: Not Progressing   Problem: Clinical Measurements: Goal: Ability to maintain clinical measurements within normal limits will improve Outcome: Not Progressing Goal: Will remain free from infection Outcome: Not Progressing Goal: Diagnostic test results will improve Outcome: Not Progressing Goal: Respiratory complications will improve Outcome: Not Progressing Goal: Cardiovascular complication will be avoided Outcome: Not Progressing   Problem: Activity: Goal: Risk for activity intolerance will decrease Outcome: Not Progressing   Problem: Nutrition: Goal: Adequate nutrition will be maintained Outcome: Not Progressing   Problem: Coping: Goal: Level of anxiety will decrease Outcome: Progressing   Problem: Elimination: Goal: Will not experience complications related to bowel motility Outcome: Not Progressing Goal: Will not experience complications related to urinary retention Outcome: Progressing   Problem: Pain Managment: Goal: General experience of comfort will improve and/or be controlled Outcome: Not Progressing   Problem: Safety: Goal: Ability to remain free from injury will improve Outcome: Not Progressing   Problem: Skin Integrity: Goal: Risk for impaired skin integrity will decrease Outcome: Not Progressing

## 2023-11-01 ENCOUNTER — Telehealth: Payer: Medicare (Managed Care) | Admitting: *Deleted

## 2023-11-01 DIAGNOSIS — D6959 Other secondary thrombocytopenia: Secondary | ICD-10-CM

## 2023-11-01 DIAGNOSIS — K7469 Other cirrhosis of liver: Secondary | ICD-10-CM | POA: Diagnosis not present

## 2023-11-01 DIAGNOSIS — B957 Other staphylococcus as the cause of diseases classified elsewhere: Secondary | ICD-10-CM | POA: Diagnosis not present

## 2023-11-01 DIAGNOSIS — R7881 Bacteremia: Secondary | ICD-10-CM | POA: Diagnosis not present

## 2023-11-01 DIAGNOSIS — N179 Acute kidney failure, unspecified: Secondary | ICD-10-CM

## 2023-11-01 DIAGNOSIS — B370 Candidal stomatitis: Secondary | ICD-10-CM

## 2023-11-01 DIAGNOSIS — K7682 Hepatic encephalopathy: Secondary | ICD-10-CM | POA: Diagnosis not present

## 2023-11-01 LAB — CK: Total CK: 50 U/L (ref 38–234)

## 2023-11-01 LAB — CULTURE, BLOOD (ROUTINE X 2)

## 2023-11-01 LAB — BASIC METABOLIC PANEL WITH GFR
Anion gap: 9 (ref 5–15)
BUN: 29 mg/dL — ABNORMAL HIGH (ref 8–23)
CO2: 16 mmol/L — ABNORMAL LOW (ref 22–32)
Calcium: 8.1 mg/dL — ABNORMAL LOW (ref 8.9–10.3)
Chloride: 116 mmol/L — ABNORMAL HIGH (ref 98–111)
Creatinine, Ser: 1.47 mg/dL — ABNORMAL HIGH (ref 0.44–1.00)
GFR, Estimated: 39 mL/min — ABNORMAL LOW (ref >60)
Glucose, Bld: 79 mg/dL (ref 70–99)
Potassium: 4.4 mmol/L (ref 3.5–5.1)
Sodium: 141 mmol/L (ref 135–145)

## 2023-11-01 LAB — MAGNESIUM: Magnesium: 1.8 mg/dL (ref 1.7–2.4)

## 2023-11-01 MED ORDER — SALINE SPRAY 0.65 % NA SOLN: 1.0000 | NASAL | Status: DC | PRN

## 2023-11-01 MED ORDER — ACETAMINOPHEN 500 MG PO TABS
500.0000 mg | ORAL_TABLET | Freq: Three times a day (TID) | ORAL | Status: DC | PRN
  Administered 2023-11-01 – 2023-11-02 (×3): 500 mg via ORAL
  Filled 2023-11-01 (×3): qty 1

## 2023-11-01 MED ORDER — FLUTICASONE PROPIONATE 50 MCG/ACT NA SUSP
2.0000 | Freq: Every day | NASAL | Status: DC
  Administered 2023-11-01 – 2023-11-03 (×3): 2 via NASAL
  Filled 2023-11-01: qty 16

## 2023-11-01 MED ORDER — SODIUM CHLORIDE 0.9 % IV SOLN: INTRAVENOUS | Status: AC | PRN

## 2023-11-01 MED ORDER — BUTALBITAL-APAP-CAFFEINE 50-325-40 MG PO TABS
1.0000 | ORAL_TABLET | Freq: Four times a day (QID) | ORAL | Status: DC | PRN
  Administered 2023-11-01: 1 via ORAL
  Filled 2023-11-01: qty 1

## 2023-11-01 MED ORDER — FLUCONAZOLE IN SODIUM CHLORIDE 200-0.9 MG/100ML-% IV SOLN
200.0000 mg | INTRAVENOUS | Status: DC
  Administered 2023-11-01 – 2023-11-02 (×2): 200 mg via INTRAVENOUS
  Filled 2023-11-01 (×2): qty 100

## 2023-11-01 MED ORDER — DAPTOMYCIN-SODIUM CHLORIDE 500-0.9 MG/50ML-% IV SOLN
8.0000 mg/kg | Freq: Every day | INTRAVENOUS | Status: DC
Start: 2023-11-01 — End: 2023-11-03
  Administered 2023-11-01 – 2023-11-02 (×2): 500 mg via INTRAVENOUS
  Filled 2023-11-01 (×3): qty 50

## 2023-11-01 MED ORDER — MAGIC MOUTHWASH
5.0000 mL | Freq: Four times a day (QID) | ORAL | Status: DC
  Administered 2023-11-01 – 2023-11-03 (×8): 5 mL via ORAL
  Filled 2023-11-01 (×10): qty 5

## 2023-11-01 NOTE — Progress Notes (Signed)
 [facility] Progress Note Patient: Janavia Rottman FMW:[id number] DOB: 1955-11-28  DOA: 10/29/2023 DOS: the patient was seen and examined on 11/01/2023  Brief Hospital Course: 68 y.o. female with medical history significant of [name], recurrent hepatic encephalopathy, short gut syndrome, chronic diarrhea, CKD 3A, anemia of chronic disease, chronic anxiety with depression, hypothyroidism, recurrent hypotensive episode, hyperlipidemia, GERD, history of kidney stones, history of pulmonary emboli who presents to the ER today complaining of confusion and is being treated for possible hepatic encephalopathy in the setting of NASH cirrhosis.    Assessment and Plan: Acute metabolic encephalopathy secondary to hepatic encephalopathy Ammonia level elevated on admission. Asterixis improving. Mentation significantly better near baseline Continue lactulose .  Continue rifaximin .  Sepsis due to staph epi bacteremia.  Present on admission Meeting SIRS criteria upon admission with tachycardia and tachypnea.  Also with leukocytosis. Blood cultures positive for staph epi.  4 out of 4. No significant skin lesions but suspecting a rash on her left thigh as a source of infection. Repeat blood cultures ordered on 10/21. ID following. Echocardiogram negative for any evidence of endocarditis for now. Appreciate ID assistance.  Currently switched to daptomycin.  Thrush. Currently being treated with Diflucan . Appreciate ID consultation.   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   Headache.   Continue Norco or Tylenol .  Added Fioricet.  Flonase  as well.   Iron  deficiency anemia H&H relatively stable. Monitor blood Continue PPI.   Chronic hypotension Continue midodrine  with holding parameters.   Hypothyroidism Continue Synthroid    Chronic vaginal bleeding due to fibroids. Continue  Megace  40 mg twice daily, Unsure what GYN's plan was since this patient has been on this medication for almost a year now.   Headache. Okay to take Tylenol . Continue as needed pain medication.   Obesity Class 2 Body mass index is 38.47 kg/m.  Placing the pt at higher risk of poor outcomes.   Hypokalemia. Treated.   Mild AKI. With monitor renal function.   Chronic thrombocytopenia  Secondary to cirrhosis.  And marrow suppression in the setting of active infection Monitor.   Subjective: No nausea no vomiting.  Ongoing headache ongoing abdominal pain.  Unchanged.  No other acute complaint.  Physical Exam: Clear to auscultation. No focal deficits. Negative asterixis. Ongoing abdominal tenderness. Trace edema.  Data Reviewed: I have Reviewed nursing notes, Vitals, and Lab results. Since last encounter, pertinent lab results CBC and BMP   . I have ordered test including CBC BMP  . I have discussed pt's care plan and test results with ID  .   Disposition: Status is: Inpatient Remains inpatient appropriate because: Monitor for culture clearance  SCDs Start: 10/30/23 0453   Family Communication: Son at bedside Level of care: Progressive   Vitals:   10/31/23 1720 10/31/23 1941 11/01/23 0520 11/01/23 1334  BP: 125/79 (!) 101/36 (!) 152/82 (!) 115/59  Pulse: 81 74 69 70  Resp:  15 18 18   Temp: 98.5 F (36.9 C) 98.4 F (36.9 C) 98.2 F (36.8 C) 98 F (36.7 C)  TempSrc: Oral   Oral  SpO2: 100% 100% 100% 100%  Weight:      Height:         Author: Yetta Blanch, MD 11/01/2023 4:52 PM  Please look on [email or web address] to find out who is on call.

## 2023-11-01 NOTE — Progress Notes (Signed)
 Mobility Specialist Progress Note:   11/01/23 1633  Mobility  Activity Ambulated with assistance  Level of Assistance Contact guard assist, steadying assist  Assistive Device Front wheel walker  Distance Ambulated (ft) 120 ft  Activity Response Tolerated well  Mobility Referral Yes  Mobility visit 1 Mobility  Mobility Specialist Start Time (ACUTE ONLY) 1550  Mobility Specialist Stop Time (ACUTE ONLY) 1605  Mobility Specialist Time Calculation (min) (ACUTE ONLY) 15 min   Pt was received in bed and agreed to mobility. No complaints from Pt. Returned to bed with all needs met. Call bell in reach.  [employer] - Mobility Specialist

## 2023-11-01 NOTE — Hospital Course (Addendum)
 68 y.o. female with medical history significant of [name], recurrent hepatic encephalopathy, short gut syndrome, chronic diarrhea, CKD 3A, anemia of chronic disease, chronic anxiety with depression, hypothyroidism, recurrent hypotensive episode, hyperlipidemia, GERD, history of kidney stones, history of pulmonary emboli who presents to the ER today complaining of confusion and is being treated for possible hepatic encephalopathy in the setting of NASH cirrhosis.    Assessment and Plan: Acute metabolic encephalopathy secondary to hepatic encephalopathy Ammonia level elevated on admission. Asterixis resolved.  Ammonia level improving.. Mentation significantly better near baseline Continue lactulose .  Continue rifaximin .  Sepsis due to staph epi bacteremia.  Present on admission Meeting SIRS criteria upon admission with tachycardia and tachypnea.  Also with leukocytosis. Blood cultures positive for staph epi.  4 out of 4. No significant skin lesions but suspecting a rash on her left thigh as a source of infection. Repeat blood cultures ordered on 10/21.  So far negative. ID following. Echocardiogram negative for any evidence of endocarditis for now. Appreciate ID assistance.  Currently switched to daptomycin.  Thrush. Currently being treated with Diflucan . Appreciate ID consultation.   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   Headache.   Continue Norco or Tylenol .  Added Fioricet.  Flonase  as well.   Iron  deficiency anemia H&H relatively stable. Monitor blood Continue PPI.   Chronic hypotension Continue midodrine  with holding parameters.   Hypothyroidism Continue Synthroid    Chronic vaginal bleeding due to fibroids. Continue Megace  40 mg twice daily, Unsure what GYN's plan was since this patient has been on this medication for almost a year now.    Headache. Okay to take Tylenol . Continue as needed pain medication.   Obesity Class 2 Body mass index is 38.47 kg/m.  Placing the pt at higher risk of poor outcomes.   Hypokalemia. Treated.   Mild AKI. With monitor renal function.   Chronic thrombocytopenia  Secondary to cirrhosis.  And marrow suppression in the setting of active infection Monitor.

## 2023-11-01 NOTE — Consult Note (Signed)
 NAME: [name]  DOB: July 03, 1955  MRN: [id number]  Date/Time: 11/01/2023 10:24 AM  REQUESTING PROVIDER: Dr.PAtel Subjective:  REASON FOR CONSULT: MRSE bacteremia ? [name] is a 68 y.o. with a history of Decompensated cirrhosis of the liver with h/o hepatic encephalopathy, short gut syndromemultiple bacteremias in the past  Was recently in the hospital 9/28-10/13 for acute encephalopathy, Is back to the ED with altered mental status on 10/29/23- as per son she was doing well on Saturday but Sunday she was confused Son says she had some redness and pain left inner thigh while in the hospital  She has past h/o cellulitis legs  10/29/23 19:36  BP 123/68  Temp 98.3 F (36.8 C)  Pulse Rate 133 !  Resp 17  SpO2 98 %     Latest Reference Range & Units 10/29/23 20:00  WBC 4.0 - 10.5 K/uL 12.8 (H)  Hemoglobin 12.0 - 15.0 g/dL 14.0  HCT 36.0 - 46.0 % 45.2  Platelets 150 - 400 K/uL 131 (L)  Creatinine 0.44 - 1.00 mg/dL 1.57 (H)   Blood culture was sent  Was treated with lactulose and rifaximin for hepatic encephalopathy I am seeing the patient as blood culture positive for staph epidermidis 4/4 Vancomycin was started on 10/20 and repeat culture sent on 10/21  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   Hyperammonemia 09/30/2022   Hypertension    Kidney stone    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   Obesity    Pulmonary emboli (HCC) 01/10/2009    Pulmonary embolism (HCC)    Short gut syndrome    Thyroid disease     Past Surgical History:  Procedure Laterality Date   ABDOMINAL WALL MESH  REMOVAL     2016   CESAREAN SECTION WITH BILATERAL TUBAL LIGATION  04/30/1985   CHOLECYSTECTOMY  05/2007   Exploratory laparotomy, lysis of adhesions, takedown of ileostomy with small bowel resection, open cholecystectomy and ileocolostomy   COLON SURGERY  01/2007   exploratory laparotomy with right colecotmy and end ileostomy   COLONOSCOPY  04/02/2019   Dr Tri Huu Le, MD [facility]   ESOPHAGOGASTRODUODENOSCOPY (EGD) WITH PROPOFOL N/A 10/19/2022   Procedure: ESOPHAGOGASTRODUODENOSCOPY (EGD) WITH PROPOFOL;  Surgeon: Karki, Arya, MD;  Location: [facility];  Service: Gastroenterology;  Laterality: N/A;   ESOPHAGOSCOPY  04/02/2019   EYE SURGERY Left 10/06/2020   Cataract extraciton w/intraocular lens implant- Dr Giegengack   HERNIA REPAIR  10/2007   ILEOSTOMY  2009   states bowel perfotation wih colonsocopy   ILEOSTOMY CLOSURE  2009   INCISIONAL HERNIA REPAIR  10/11/2018   IR KYPHO THORACIC WITH BONE BIOPSY  08/31/2022   IR KYPHO THORACIC WITH BONE BIOPSY  10/27/2022   IR RADIOLOGIST EVAL & MGMT  09/27/2022   KIDNEY STONE SURGERY     LIVER RESECTION     20 09   PANNICULECTOMY     repair of abdominal wall hernia   WRIST SURGERY  tendonitis    Social History   Socioeconomic History   Marital status: Single    Spouse name: Not on file   Number of children: 1   Years of education: Not on file   Highest education level: Not on file  Occupational History   Not on file  Tobacco Use   Smoking status: Former    Current packs/day: 0.00    Types: Cigarettes    Quit date: 41    Years since quitting: 38.8    Passive exposure: Past   Smokeless tobacco: Never  Vaping Use   Vaping status: Never Used  Substance and Sexual Activity   Alcohol use: No   Drug use: No   Sexual activity: Not Currently    Birth control/protection:  Post-menopausal  Other Topics Concern   Not on file  Social History Narrative   Not on file   Social Drivers of Health   Financial Resource Strain: Low Risk  (09/20/2023)   Overall Financial Resource Strain (CARDIA)    Difficulty of Paying Living Expenses: Not hard at all  Food Insecurity: No Food Insecurity (10/30/2023)   Hunger Vital Sign    Worried About Running Out of Food in the Last Year: Never true    Ran Out of Food in the Last Year: Never true  Transportation Needs: No Transportation Needs (10/30/2023)   PRAPARE - Administrator, Civil Service (Medical): No    Lack of Transportation (Non-Medical): No  Physical Activity: Inactive (09/20/2023)   Exercise Vital Sign    Days of Exercise per Week: 0 days    Minutes of Exercise per Session: 0 min  Stress: No Stress Concern Present (09/20/2023)   [employer] of Occupational Health - Occupational Stress Questionnaire    Feeling of Stress: Only a little  Social Connections: Socially Isolated (10/30/2023)   Social Connection and Isolation Panel    Frequency of Communication with Friends and Family: Three times a week    Frequency of Social Gatherings with Friends and Family: Three times a week    Attends Religious Services: Never    Active Member of Clubs or Organizations: No    Attends Banker Meetings: Never    Marital Status: Widowed  Intimate Partner Violence: Not At Risk (10/30/2023)   Humiliation, Afraid, Rape, and Kick questionnaire    Fear of Current or Ex-Partner: No    Emotionally Abused: No    Physically Abused: No    Sexually Abused: No    Family History  Problem Relation Age of Onset   Uterine cancer Mother    Bone cancer Father    Heart disease Father    Hyperlipidemia Father    Dementia Father    Hypertension Father    Skin cancer Sister    Hypertension Sister    Diabetes Sister    Hyperlipidemia Brother    Hypertension Brother    Heart disease Brother    Allergies   Allergen Reactions   Ace Inhibitors Anaphylaxis and Swelling   Gadolinium Derivatives Anaphylaxis   Ivp Dye [Iodinated Contrast Media] Anaphylaxis   Wound Dressing Adhesive Dermatitis   Desitin [Zinc  Oxide] Rash and Other (See Comments)    Worsens rash   Butrans [Buprenorphine] Other (See Comments)    Discontinued due to liver function   Yellow Jacket Venom Swelling   Hibiclens  [Chlorhexidine  Gluconate] Rash and Other (See Comments)    Worsens rash   Penicillins Dermatitis and Rash   Vibramycin [Doxycycline] Rash  Zofran  [Ondansetron ] Rash   I? Current Facility-Administered Medications  Medication Dose Route Frequency Provider Last Rate Last Admin   acetaminophen  (TYLENOL ) tablet 500 mg  500 mg Oral Q8H PRN Patel, Pranav M, MD   500 mg at 10/31/23 2253   albuterol  (PROVENTIL ) (2.5 MG/3ML) 0.083% nebulizer solution 2.5 mg  2.5 mg Nebulization Q6H PRN Garba, Mohammad L, MD       ascorbic acid  (VITAMIN C ) tablet 250 mg  250 mg Oral Q breakfast Sim Re L, MD   250 mg at 10/31/23 0904   atorvastatin  (LIPITOR) tablet 40 mg  40 mg Oral Daily Garba, Mohammad L, MD   40 mg at 10/31/23 9095   cyanocobalamin  (VITAMIN B12) tablet 2,000 mcg  2,000 mcg Oral Q M,W,F Sim Re CROME, MD   2,000 mcg at 10/30/23 9042   dicyclomine  (BENTYL ) capsule 10 mg  10 mg Oral TID PRN Sim Re CROME, MD       famotidine  (PEPCID ) tablet 20 mg  20 mg Oral Daily PRN Garba, Mohammad L, MD       ferrous sulfate  tablet 325 mg  325 mg Oral Q breakfast Sim Re L, MD   325 mg at 10/31/23 9095   furosemide  (LASIX ) tablet 40 mg  40 mg Oral BID Garba, Mohammad L, MD   40 mg at 10/31/23 1815   HYDROcodone -acetaminophen  (NORCO) 10-325 MG per tablet 1 tablet  1 tablet Oral Q8H PRN Sim Re CROME, MD   1 tablet at 10/30/23 1549   lactulose  (CHRONULAC ) 10 GM/15ML solution 30 g  30 g Oral QID Sim Re CROME, MD   30 g at 10/31/23 2254   levothyroxine  (SYNTHROID ) tablet 175 mcg  175 mcg Oral Q0600 Sim Re CROME, MD   175 mcg at 11/01/23 0632   magnesium  gluconate (MAGONATE) tablet 500 mg  500 mg Oral Daily Sim Re CROME, MD   500 mg at 10/31/23 0905   megestrol  (MEGACE ) tablet 40 mg  40 mg Oral BID Garba, Mohammad L, MD   40 mg at 10/31/23 2254   methocarbamol  (ROBAXIN ) tablet 500 mg  500 mg Oral q AM Sim Re CROME, MD   500 mg at 11/01/23 9367   midodrine  (PROAMATINE ) tablet 10 mg  10 mg Oral TID WC Patel, Pranav M, MD   10 mg at 10/31/23 1717   nystatin  cream (MYCOSTATIN ) 1 Application  1 Application Topical BID PRN Garba, Mohammad L, MD       pantoprazole  (PROTONIX ) EC tablet 40 mg  40 mg Oral BID AC Sim Re L, MD   40 mg at 10/31/23 1717   potassium chloride  (KLOR-CON  M) CR tablet 20 mEq  20 mEq Oral BID Sim Re L, MD   20 mEq at 10/31/23 2254   rifaximin  (XIFAXAN ) tablet 550 mg  550 mg Oral BID Sim Re CROME, MD   550 mg at 10/31/23 2254   rOPINIRole  (REQUIP ) tablet 0.25 mg  0.25 mg Oral QHS Sim Re L, MD   0.25 mg at 10/31/23 2255   senna-docusate (Senokot-S) tablet 1 tablet  1 tablet Oral BID Sim Re CROME, MD   1 tablet at 10/31/23 2254   spironolactone  (ALDACTONE ) tablet 50 mg  50 mg Oral Daily Patel, Pranav M, MD       triamcinolone  cream (KENALOG ) 0.1 % cream 1 Application  1 Application Topical BID PRN Sim Re CROME, MD       vancomycin  (VANCOREADY) IVPB 750 mg/150 mL  750 mg Intravenous Q24H  Brenton Alfonso HERO, RPH 150 mL/hr at 10/31/23 2251 750 mg at 10/31/23 2251   venlafaxine  XR (EFFEXOR -XR) 24 hr capsule 75 mg  75 mg Oral Q breakfast Sim Emery CROME, MD   75 mg at 10/31/23 9095   Vitamin D  (Ergocalciferol ) (DRISDOL) 1.25 MG (50000 UNIT) capsule 50,000 Units  50,000 Units Oral Q Wed Garba, Emery CROME, MD         Abtx:  Anti-infectives (From admission, onward)    Start     Dose/Rate Route Frequency Ordered Stop   10/31/23 2300  vancomycin  (VANCOREADY) IVPB 750 mg/150 mL        750 mg 150 mL/hr over 60 Minutes Intravenous Every  24 hours 10/30/23 2209     10/30/23 2215  vancomycin  (VANCOREADY) IVPB 2000 mg/400 mL        2,000 mg 200 mL/hr over 120 Minutes Intravenous  Once 10/30/23 2209 10/31/23 0904   10/30/23 1000  rifaximin  (XIFAXAN ) tablet 550 mg        550 mg Oral 2 times daily 10/30/23 0453     10/29/23 2215  cefTRIAXone  (ROCEPHIN ) 1 g in sodium chloride  0.9 % 100 mL IVPB        1 g 200 mL/hr over 30 Minutes Intravenous  Once 10/29/23 2210 10/30/23 0025       REVIEW OF SYSTEMS:  Const: negative fever, negative chills, negative weight loss Eyes: negative diplopia or visual changes, negative eye pain ENT: negative coryza, negative sore throat Resp: negative cough, hemoptysis, dyspnea Cards: negative for chest pain, palpitations, lower extremity edema GU: negative for frequency, dysuria and hematuria GI: +abdominal pain, diarrhea, bleeding, constipation Skin: painful rash over her left thigh Heme: negative for easy bruising and gum/nose bleeding MS: weakness Neurolo:negative for headaches, dizziness, vertigo, memory problems  Psych: anxiety, depression  Endocrine: negative for thyroid , diabetes Allergy/Immunology- multiple allergies: Objective:  VITALS:  BP (!) 152/82 (BP Location: Left Arm)   Pulse 69   Temp 98.2 F (36.8 C)   Resp 18   Ht 5' 2 (1.575 m)   Wt 95.4 kg   SpO2 100%   BMI 38.47 kg/m   PHYSICAL EXAM:  General: Alert, cooperative, no distress, appears stated age. Increased BMI Head: Normocephalic, without obvious abnormality, atraumatic. Eyes: left eye leucoma ENT Nares normal. No drainage or sinus tenderness. Lips, mucosa, and tongue normal. No Thrush Neck: Supple, symmetrical, no adenopathy, thyroid : non tender no carotid bruit and no JVD. Back: No CVA tenderness. Lungs: b/la ir entry. Heart: Regular rate and rhythm, no murmur, rub or gallop. Abdomen: Soft, non-tender,not distended. Bowel sounds normal. No masses Intertrigo both breasts  pannus Extremities: left thigh  erythema and a linear scratch- tender  Skin: No rashes or lesions. Or bruising Lymph: Cervical, supraclavicular normal. Neurologic: Grossly non-focal Pertinent Labs Lab Results CBC    Component Value Date/Time   WBC 11.6 (H) 10/30/2023 0530   RBC 3.43 (L) 10/30/2023 0530   HGB 10.8 (L) 10/30/2023 0530   HCT 34.1 (L) 10/30/2023 0530   PLT 80 (L) 10/30/2023 0530   MCV 99.4 10/30/2023 0530   MCH 31.5 10/30/2023 0530   MCHC 31.7 10/30/2023 0530   RDW 16.1 (H) 10/30/2023 0530   LYMPHSABS 1.3 10/23/2023 0409   MONOABS 0.5 10/23/2023 0409   EOSABS 0.3 10/23/2023 0409   BASOSABS 0.0 10/23/2023 0409       Latest Ref Rng & Units 11/01/2023    5:33 AM 10/30/2023    5:30 AM 10/29/2023    8:00  PM  CMP  Glucose 70 - 99 mg/dL 79  93  850   BUN 8 - 23 mg/dL 29  30  29    Creatinine 0.44 - 1.00 mg/dL 8.52  8.64  8.42   Sodium 135 - 145 mmol/L 141  140  141   Potassium 3.5 - 5.1 mmol/L 4.4  3.8  3.4   Chloride 98 - 111 mmol/L 116  113  112   CO2 22 - 32 mmol/L 16  14  15    Calcium  8.9 - 10.3 mg/dL 8.1  8.3  9.2   Total Protein 6.5 - 8.1 g/dL  4.5  6.4   Total Bilirubin 0.0 - 1.2 mg/dL  1.6  1.7   Alkaline Phos 38 - 126 U/L  79  114   AST 15 - 41 U/L  34  39   ALT 0 - 44 U/L  25  30       Microbiology: Recent Results (from the past 240 hours)  Blood Culture (routine x 2)     Status: Abnormal   Collection Time: 10/29/23  8:30 PM   Specimen: BLOOD  Result Value Ref Range Status   Specimen Description   Final    BLOOD LEFT ANTECUBITAL Performed at [facility], [address], [place], [place] [address]    Special Requests   Final    BOTTLES DRAWN AEROBIC AND ANAEROBIC Blood Culture results may not be optimal due to an inadequate volume of blood received in culture bottles Performed at [facility], [address], [place], [place] [address]    Culture  Setup Time   Final    GRAM POSITIVE COCCI IN BOTH AEROBIC AND ANAEROBIC  BOTTLES CRITICAL VALUE NOTED.  VALUE IS CONSISTENT WITH PREVIOUSLY REPORTED AND CALLED VALUE. Performed at [facility], [address], [place], [place] [address]    Culture STAPHYLOCOCCUS EPIDERMIDIS (A)  Final   Report Status 11/01/2023 FINAL  Final   Organism ID, Bacteria STAPHYLOCOCCUS EPIDERMIDIS  Final      Susceptibility   Staphylococcus epidermidis - MIC*    CIPROFLOXACIN  >=8 RESISTANT Resistant     ERYTHROMYCIN >=8 RESISTANT Resistant     GENTAMICIN <=0.5 SENSITIVE Sensitive     OXACILLIN >=4 RESISTANT Resistant     TETRACYCLINE 2 SENSITIVE Sensitive     VANCOMYCIN  1 SENSITIVE Sensitive     TRIMETH /SULFA  80 RESISTANT Resistant     CLINDAMYCIN  >=8 RESISTANT Resistant     RIFAMPIN >=32 RESISTANT Resistant     Inducible Clindamycin  NEGATIVE Sensitive     * STAPHYLOCOCCUS EPIDERMIDIS  Blood Culture (routine x 2)     Status: Abnormal   Collection Time: 10/29/23  8:34 PM   Specimen: BLOOD RIGHT FOREARM  Result Value Ref Range Status   Specimen Description   Final    BLOOD RIGHT FOREARM Performed at [facility], [address], [place], [place] [address]    Special Requests   Final    BOTTLES DRAWN AEROBIC AND ANAEROBIC Blood Culture results may not be optimal due to an inadequate volume of blood received in culture bottles Performed at [facility], [address], [place], [place] [address]    Culture  Setup Time   Final    GRAM POSITIVE COCCI IN CLUSTERS IN BOTH AEROBIC AND ANAEROBIC BOTTLES CRITICAL RESULT CALLED TO, READ BACK BY AND VERIFIED WITH: PHARMD M LILLISTON 10/30/2023 @ 2140 BY AB Performed at [facility], [address] [place], [place] [address]    Culture STAPHYLOCOCCUS EPIDERMIDIS (A)  Final   Report Status 11/01/2023 FINAL  Final   Organism ID, Bacteria STAPHYLOCOCCUS EPIDERMIDIS  Final      Susceptibility   Staphylococcus epidermidis - MIC*    CIPROFLOXACIN  >=8 RESISTANT Resistant     ERYTHROMYCIN >=8  RESISTANT Resistant     GENTAMICIN <=0.5 SENSITIVE Sensitive     OXACILLIN >=4 RESISTANT Resistant     TETRACYCLINE 2 SENSITIVE Sensitive     VANCOMYCIN  1 SENSITIVE Sensitive     TRIMETH /SULFA  80 RESISTANT Resistant     CLINDAMYCIN  >=8 RESISTANT Resistant     RIFAMPIN >=32 RESISTANT Resistant     Inducible Clindamycin  NEGATIVE Sensitive     * STAPHYLOCOCCUS EPIDERMIDIS  Blood Culture ID Panel (Reflexed)     Status: Abnormal   Collection Time: 10/29/23  8:34 PM  Result Value Ref Range Status   Enterococcus faecalis NOT DETECTED NOT DETECTED Final   Enterococcus Faecium NOT DETECTED NOT DETECTED Final   Listeria monocytogenes NOT DETECTED NOT DETECTED Final   Staphylococcus species DETECTED (A) NOT DETECTED Final    Comment: CRITICAL RESULT CALLED TO, READ BACK BY AND VERIFIED WITH: PHARMD M LILLISTON 10/30/2023 @ 2140 BY AB    Staphylococcus aureus (BCID) NOT DETECTED NOT DETECTED Final   Staphylococcus epidermidis DETECTED (A) NOT DETECTED Final    Comment: Methicillin (oxacillin) resistant coagulase negative staphylococcus. Possible blood culture contaminant (unless isolated from more than one blood culture draw or clinical case suggests pathogenicity). No antibiotic treatment is indicated for blood  culture contaminants. CRITICAL RESULT CALLED TO, READ BACK BY AND VERIFIED WITH: PHARMD M LILLISTON 10/30/2023 @ 2140 BY AB    Staphylococcus lugdunensis NOT DETECTED NOT DETECTED Final   Streptococcus species NOT DETECTED NOT DETECTED Final   Streptococcus agalactiae NOT DETECTED NOT DETECTED Final   Streptococcus pneumoniae NOT DETECTED NOT DETECTED Final   Streptococcus pyogenes NOT DETECTED NOT DETECTED Final   A.calcoaceticus-baumannii NOT DETECTED NOT DETECTED Final   Bacteroides fragilis NOT DETECTED NOT DETECTED Final   Enterobacterales NOT DETECTED NOT DETECTED Final   Enterobacter cloacae complex NOT DETECTED NOT DETECTED Final   Escherichia coli NOT DETECTED NOT DETECTED  Final   Klebsiella aerogenes NOT DETECTED NOT DETECTED Final   Klebsiella oxytoca NOT DETECTED NOT DETECTED Final   Klebsiella pneumoniae NOT DETECTED NOT DETECTED Final   Proteus species NOT DETECTED NOT DETECTED Final   Salmonella species NOT DETECTED NOT DETECTED Final   Serratia marcescens NOT DETECTED NOT DETECTED Final   Haemophilus influenzae NOT DETECTED NOT DETECTED Final   Neisseria meningitidis NOT DETECTED NOT DETECTED Final   Pseudomonas aeruginosa NOT DETECTED NOT DETECTED Final   Stenotrophomonas maltophilia NOT DETECTED NOT DETECTED Final   Candida albicans NOT DETECTED NOT DETECTED Final   Candida auris NOT DETECTED NOT DETECTED Final   Candida glabrata NOT DETECTED NOT DETECTED Final   Candida krusei NOT DETECTED NOT DETECTED Final   Candida parapsilosis NOT DETECTED NOT DETECTED Final   Candida tropicalis NOT DETECTED NOT DETECTED Final   Cryptococcus neoformans/gattii NOT DETECTED NOT DETECTED Final   Methicillin resistance mecA/C DETECTED (A) NOT DETECTED Final    Comment: CRITICAL RESULT CALLED TO, READ BACK BY AND VERIFIED WITH: PHARMD M LILLISTON 10/30/2023 @ 2140 BY AB Performed at [facility], [address], [place], [place] [address]   Resp panel by RT-PCR (RSV, Flu A&B, Covid) Anterior Nasal Swab     Status: None  Collection Time: 10/29/23 10:28 PM   Specimen: Anterior Nasal Swab  Result Value Ref Range Status   SARS Coronavirus 2 by RT PCR NEGATIVE NEGATIVE Final    Comment: (NOTE) SARS-CoV-2 target nucleic acids are NOT DETECTED.  The SARS-CoV-2 RNA is generally detectable in upper respiratory specimens during the acute phase of infection. The lowest concentration of SARS-CoV-2 viral copies this assay can detect is 138 copies/mL. A negative result does not preclude SARS-Cov-2 infection and should not be used as the sole basis for treatment or other patient management decisions. A negative result may occur with  improper specimen  collection/handling, submission of specimen other than nasopharyngeal swab, presence of viral mutation(s) within the areas targeted by this assay, and inadequate number of viral copies(<138 copies/mL). A negative result must be combined with clinical observations, patient history, and epidemiological information. The expected result is Negative.  Fact Sheet for Patients:  BloggerCourse.com  Fact Sheet for Healthcare Providers:  SeriousBroker.it  This test is no t yet approved or cleared by the [place]  FDA and  has been authorized for detection and/or diagnosis of SARS-CoV-2 by FDA under an Emergency Use Authorization (EUA). This EUA will remain  in effect (meaning this test can be used) for the duration of the COVID-19 declaration under Section 564(b)(1) of the Act, 21 U.S.C.section 360bbb-3(b)(1), unless the authorization is terminated  or revoked sooner.       Influenza A by PCR NEGATIVE NEGATIVE Final   Influenza B by PCR NEGATIVE NEGATIVE Final    Comment: (NOTE) The Xpert Xpress SARS-CoV-2/FLU/RSV plus assay is intended as an aid in the diagnosis of influenza from Nasopharyngeal swab specimens and should not be used as a sole basis for treatment. Nasal washings and aspirates are unacceptable for Xpert Xpress SARS-CoV-2/FLU/RSV testing.  Fact Sheet for Patients: BloggerCourse.com  Fact Sheet for Healthcare Providers: SeriousBroker.it  This test is not yet approved or cleared by the [place]  FDA and has been authorized for detection and/or diagnosis of SARS-CoV-2 by FDA under an Emergency Use Authorization (EUA). This EUA will remain in effect (meaning this test can be used) for the duration of the COVID-19 declaration under Section 564(b)(1) of the Act, 21 U.S.C. section 360bbb-3(b)(1), unless the authorization is terminated or revoked.     Resp Syncytial  Virus by PCR NEGATIVE NEGATIVE Final    Comment: (NOTE) Fact Sheet for Patients: BloggerCourse.com  Fact Sheet for Healthcare Providers: SeriousBroker.it  This test is not yet approved or cleared by the [place]  FDA and has been authorized for detection and/or diagnosis of SARS-CoV-2 by FDA under an Emergency Use Authorization (EUA). This EUA will remain in effect (meaning this test can be used) for the duration of the COVID-19 declaration under Section 564(b)(1) of the Act, 21 U.S.C. section 360bbb-3(b)(1), unless the authorization is terminated or revoked.  Performed at [facility], [address] [place], [place] [address]   Urine Culture     Status: None   Collection Time: 10/30/23  5:00 AM   Specimen: Urine, Clean Catch  Result Value Ref Range Status   Specimen Description   Final    URINE, CLEAN CATCH Performed at [facility], [address], [place], [place] [address]    Special Requests   Final    NONE Performed at [facility], [address], [place], [place] [address]    Culture   Final    NO GROWTH Performed at [facility], [address] [place], [place] [address]    Report Status 10/31/2023 FINAL  Final  Gastrointestinal Panel by PCR , Stool     Status: None   Collection Time: 10/30/23  1:47 PM   Specimen: Stool  Result Value Ref Range Status   Campylobacter species NOT DETECTED NOT DETECTED Final   Plesimonas shigelloides NOT DETECTED NOT DETECTED Final   Salmonella species NOT DETECTED NOT DETECTED Final   Yersinia enterocolitica NOT DETECTED NOT DETECTED Final   Vibrio species NOT DETECTED NOT DETECTED Final   Vibrio cholerae NOT DETECTED NOT DETECTED Final   Enteroaggregative E coli (EAEC) NOT DETECTED NOT DETECTED Final   Enteropathogenic E coli (EPEC) NOT DETECTED NOT DETECTED Final   Enterotoxigenic E coli (ETEC) NOT DETECTED  NOT DETECTED Final   Shiga like toxin producing E coli (STEC) NOT DETECTED NOT DETECTED Final   Shigella/Enteroinvasive E coli (EIEC) NOT DETECTED NOT DETECTED Final   Cryptosporidium NOT DETECTED NOT DETECTED Final   Cyclospora cayetanensis NOT DETECTED NOT DETECTED Final   Entamoeba histolytica NOT DETECTED NOT DETECTED Final   Giardia lamblia NOT DETECTED NOT DETECTED Final   Adenovirus F40/41 NOT DETECTED NOT DETECTED Final   Astrovirus NOT DETECTED NOT DETECTED Final   Norovirus GI/GII NOT DETECTED NOT DETECTED Final   Rotavirus A NOT DETECTED NOT DETECTED Final   Sapovirus (I, II, IV, and V) NOT DETECTED NOT DETECTED Final    Comment: Performed at [facility], [address] [place], [place] [address]  Culture, blood (Routine X 2) w Reflex to ID Panel     Status: None (Preliminary result)   Collection Time: 10/31/23  5:09 AM   Specimen: BLOOD RIGHT HAND  Result Value Ref Range Status   Specimen Description   Final    BLOOD RIGHT HAND Performed at [facility], [address] [place], [place] [address]    Special Requests   Final    BOTTLES DRAWN AEROBIC AND ANAEROBIC Blood Culture results may not be optimal due to an inadequate volume of blood received in culture bottles Performed at [facility], [address] [place], [place] [address]    Culture   Final    NO GROWTH < 24 HOURS Performed at [facility], [address] [place], [place] [address]    Report Status PENDING  Incomplete  Culture, blood (Routine X 2) w Reflex to ID Panel     Status: None (Preliminary result)   Collection Time: 10/31/23  5:09 AM   Specimen: BLOOD  Result Value Ref Range Status   Specimen Description   Final    BLOOD LEFT ANTECUBITAL Performed at [facility], [address], [place], [place] [address]    Special Requests   Final    BOTTLES DRAWN AEROBIC AND ANAEROBIC Blood Culture results may not be optimal due to an inadequate  volume of blood received in culture bottles Performed at [facility], [address], [place], [place] [address]    Culture   Final    NO GROWTH < 24 HOURS Performed at [facility], [name], [place], [place] [address]    Report Status PENDING  Incomplete      IMAGING RESULTS: CT head No acute abnormality Chest x-ray Low lung volumes with no acute cardiopulmonary process. I have personally reviewed the films ? Impression/Recommendation Decompensated cirrhosis of the liver Hepatic encephalopathy acute on lactulose  and rifaximin  Much improved  MRSE bacteremia 4  out of 4 Likely source is the leg She does have mild cellulitis on the left leg There is also a linear scratch which is erythematous and painful Not sure whether she got it from her cat or otherwise. 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  Hypothyroidism on Synthroid   This consult involved complex antimicrobial management  I have personally spent  75---minutes involved in face-to-face and non-face-to-face activities for this patient on the day of the visit. Professional time spent includes the following activities: Preparing to see the patient (review of tests), Obtaining and/or reviewing separately obtained history (admission/discharge record), Performing a medically appropriate examination and/or evaluation , Ordering medications/tests/procedures, referring and communicating with other health care professionals, Documenting clinical information in the EMR, Independently interpreting results (not separately reported), Communicating results to the patient/ son, Counseling  and educating the patient/son and Care coordination (not separately reported).    ________________________________________________  Note:  This document was prepared using Conservation officer, historic buildings and may include unintentional dictation errors.

## 2023-11-02 DIAGNOSIS — R7881 Bacteremia: Secondary | ICD-10-CM | POA: Diagnosis not present

## 2023-11-02 DIAGNOSIS — B957 Other staphylococcus as the cause of diseases classified elsewhere: Secondary | ICD-10-CM | POA: Diagnosis not present

## 2023-11-02 DIAGNOSIS — K7682 Hepatic encephalopathy: Secondary | ICD-10-CM | POA: Diagnosis not present

## 2023-11-02 DIAGNOSIS — K7469 Other cirrhosis of liver: Secondary | ICD-10-CM | POA: Diagnosis not present

## 2023-11-02 LAB — CBC
HCT: 33.8 % — ABNORMAL LOW (ref 36.0–46.0)
Hemoglobin: 10.2 g/dL — ABNORMAL LOW (ref 12.0–15.0)
MCH: 30.4 pg (ref 26.0–34.0)
MCHC: 30.2 g/dL (ref 30.0–36.0)
MCV: 100.6 fL — ABNORMAL HIGH (ref 80.0–100.0)
Platelets: 109 K/uL — ABNORMAL LOW (ref 150–400)
RBC: 3.36 MIL/uL — ABNORMAL LOW (ref 3.87–5.11)
RDW: 16 % — ABNORMAL HIGH (ref 11.5–15.5)
WBC: 6 K/uL (ref 4.0–10.5)
nRBC: 0 % (ref 0.0–0.2)

## 2023-11-02 LAB — BASIC METABOLIC PANEL WITH GFR
Anion gap: 11 (ref 5–15)
BUN: 25 mg/dL — ABNORMAL HIGH (ref 8–23)
CO2: 17 mmol/L — ABNORMAL LOW (ref 22–32)
Calcium: 7.8 mg/dL — ABNORMAL LOW (ref 8.9–10.3)
Chloride: 116 mmol/L — ABNORMAL HIGH (ref 98–111)
Creatinine, Ser: 1.28 mg/dL — ABNORMAL HIGH (ref 0.44–1.00)
GFR, Estimated: 46 mL/min — ABNORMAL LOW (ref >60)
Glucose, Bld: 144 mg/dL — ABNORMAL HIGH (ref 70–99)
Potassium: 3.8 mmol/L (ref 3.5–5.1)
Sodium: 143 mmol/L (ref 135–145)

## 2023-11-02 LAB — AMMONIA: Ammonia: 52 umol/L — ABNORMAL HIGH (ref 9–35)

## 2023-11-02 MED ORDER — ORAL CARE MOUTH RINSE: 15.0000 mL | OROMUCOSAL | Status: DC | PRN

## 2023-11-02 MED ORDER — FLUCONAZOLE 100 MG PO TABS
200.0000 mg | ORAL_TABLET | Freq: Every day | ORAL | Status: DC
  Administered 2023-11-03: 200 mg via ORAL
  Filled 2023-11-02: qty 2

## 2023-11-02 NOTE — Progress Notes (Signed)
 [facility] Progress Note Patient: [name] FMW:[id number] DOB: [birth year]  DOA: 10/29/2023 DOS: the patient was seen and examined on 11/02/2023  Brief Hospital Course: 68 y.o. female with medical history significant of [name], recurrent hepatic encephalopathy, short gut syndrome, chronic diarrhea, CKD 3A, anemia of chronic disease, chronic anxiety with depression, hypothyroidism, recurrent hypotensive episode, hyperlipidemia, GERD, history of kidney stones, history of pulmonary emboli who presents to the ER today complaining of confusion and is being treated for possible hepatic encephalopathy in the setting of NASH cirrhosis.    Assessment and Plan: Acute metabolic encephalopathy secondary to hepatic encephalopathy Ammonia level elevated on admission. Asterixis resolved.  Ammonia level improving.. Mentation significantly better near baseline Continue lactulose .  Continue rifaximin .  Sepsis due to staph epi bacteremia.  Present on admission Meeting SIRS criteria upon admission with tachycardia and tachypnea.  Also with leukocytosis. Blood cultures positive for staph epi.  4 out of 4. No significant skin lesions but suspecting a rash on her left thigh as a source of infection. Repeat blood cultures ordered on 10/21.  So far negative. ID following. Echocardiogram negative for any evidence of endocarditis for now. Appreciate ID assistance.  Currently switched to daptomycin.  Thrush. Currently being treated with Diflucan . Appreciate ID consultation.   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   Headache.   Continue Norco or Tylenol .  Added Fioricet.  Flonase  as well.   Iron  deficiency anemia H&H relatively stable. Monitor blood Continue PPI.   Chronic hypotension Continue midodrine  with holding parameters.   Hypothyroidism Continue Synthroid    Chronic  vaginal bleeding due to fibroids. Continue Megace  40 mg twice daily, Unsure what GYN's plan was since this patient has been on this medication for almost a year now.   Headache. Okay to take Tylenol . Continue as needed pain medication.   Obesity Class 2 Body mass index is 38.47 kg/m.  Placing the pt at higher risk of poor outcomes.   Hypokalemia. Treated.   Mild AKI. With monitor renal function.   Chronic thrombocytopenia  Secondary to cirrhosis.  And marrow suppression in the setting of active infection Monitor.   Subjective: Mentation better.  No nausea no vomiting.  Headache improving.  Has multiple bowel movements.  Physical Exam: Clear to auscultation  bowel sound present Diffusely tender. Trace edema. No new focal deficit.  Data Reviewed: I have Reviewed nursing notes, Vitals, and Lab results. Since last encounter, pertinent lab results CBC and BMP   . I have ordered test including CBC and BMP  .   Disposition: Status is: Inpatient Remains inpatient appropriate because: Awaiting plan for outpatient Antibiotics   SCDs Start: 10/30/23 0453   Family Communication: No one at bedside but discussed with son on 10/22. Level of care: Progressive   Vitals:   11/02/23 0527 11/02/23 0816 11/02/23 1323 11/02/23 1610  BP: (!) 156/96 (!) 101/59 (!) 101/55 (!) 105/45  Pulse: 77 75 71 68  Resp:   20   Temp: 98.5 F (36.9 C)  98.1 F (36.7 C)   TempSrc: Oral  Oral   SpO2: 99%  100%   Weight:      Height:         Author: Yetta Blanch, MD 11/02/2023 7:26 PM  Please look on [email or web address] to find out who is on call.

## 2023-11-02 NOTE — Progress Notes (Signed)
 Date of Admission:  10/29/2023      Active Problems:   Liver cirrhosis secondary to NASH (HCC)   GERD (gastroesophageal reflux disease)   Hypothyroidism   Hypokalemia   DNR (do not resuscitate)/DNI(Do Not intubate)   History of DVT (deep vein thrombosis)   Chronic kidney disease, stage 3a (HCC)   Hepatic encephalopathy (HCC)   AKI (acute kidney injury)    Subjective: Patient is doing better Says the left thigh pain is much better  Medications:   ascorbic acid   250 mg Oral Q breakfast   atorvastatin   40 mg Oral Daily   cyanocobalamin   2,000 mcg Oral Q M,W,F   ferrous sulfate   325 mg Oral Q breakfast   [START ON 11/03/2023] fluconazole   200 mg Oral Daily   fluticasone   2 spray Each Nare Daily   furosemide   40 mg Oral BID   lactulose   30 g Oral QID   levothyroxine   175 mcg Oral Q0600   magic mouthwash  5 mL Oral QID   magnesium  gluconate  500 mg Oral Daily   megestrol   40 mg Oral BID   methocarbamol   500 mg Oral q AM   midodrine   10 mg Oral TID WC   pantoprazole   40 mg Oral BID AC   potassium chloride   20 mEq Oral BID   rifaximin   550 mg Oral BID   rOPINIRole   0.25 mg Oral QHS   senna-docusate  1 tablet Oral BID   spironolactone   50 mg Oral Daily   venlafaxine  XR  75 mg Oral Q breakfast   Vitamin D  (Ergocalciferol )  50,000 Units Oral Q Wed    Objective: Vital signs in last 24 hours: Patient Vitals for the past 24 hrs:  BP Temp Temp src Pulse Resp SpO2  11/02/23 1610 (!) 105/45 -- -- 68 -- --  11/02/23 1323 (!) 101/55 98.1 F (36.7 C) Oral 71 20 100 %  11/02/23 0816 (!) 101/59 -- -- 75 -- --  11/02/23 0527 (!) 156/96 98.5 F (36.9 C) Oral 77 -- 99 %       PHYSICAL EXAM:  General: Alert, cooperative, no distress, appears stated age.  Lungs: Bilateral air entry. Heart: S1-S2. Abdomen: Soft, non-tender,not distended. Bowel sounds normal. No masses Extremities: Left thigh linear scratch like erythema and surrounding erythema has all resolved No tenderness   skin: Intertrigo under the breasts Lymph: Cervical, supraclavicular normal. Neurologic: Grossly non-focal  Lab Results    Latest Ref Rng & Units 11/02/2023    5:23 AM 10/30/2023    5:30 AM 10/29/2023    8:00 PM  CBC  WBC 4.0 - 10.5 K/uL 6.0  11.6  12.8   Hemoglobin 12.0 - 15.0 g/dL 89.7  89.1  85.9   Hematocrit 36.0 - 46.0 % 33.8  34.1  45.2   Platelets 150 - 400 K/uL 109  80  131        Latest Ref Rng & Units 11/02/2023    5:23 AM 11/01/2023    5:33 AM 10/30/2023    5:30 AM  CMP  Glucose 70 - 99 mg/dL 855  79  93   BUN 8 - 23 mg/dL 25  29  30    Creatinine 0.44 - 1.00 mg/dL 8.71  8.52  8.64   Sodium 135 - 145 mmol/L 143  141  140   Potassium 3.5 - 5.1 mmol/L 3.8  4.4  3.8   Chloride 98 - 111 mmol/L 116  116  113  CO2 22 - 32 mmol/L 17  16  14    Calcium  8.9 - 10.3 mg/dL 7.8  8.1  8.3   Total Protein 6.5 - 8.1 g/dL   4.5   Total Bilirubin 0.0 - 1.2 mg/dL   1.6   Alkaline Phos 38 - 126 U/L   79   AST 15 - 41 U/L   34   ALT 0 - 44 U/L   25       Microbiology: 10/29/2023 2 out of 2 set MRSE   studies/Results: CT head no acute findings.   Assessment/Plan: Decompensated cirrhosis of the liver Acute hepatic encephalopathy resolved on lactulose  and rifaximin   MRSE ( staph epidermidis)  bacteremia 4 out of 4 Likely source is the left leg Cellulitis of the left leg has resolved Patient is currently on daptomycin Repeat blood cultures have been sent May soon be able to transition to p.o. linezolid  for a total antibiotic of 10 to 14 days  2D echo showed right ventricular size moderately enlarged, left atrial size was moderately dilated and the mitral valve was degenerative Aortic valve showed moderate calcification. The pulmonic valve was grossly normal The tricuspid valve was normal in structure Low risk for endocarditis so no need for TEE  Chronic thrombocytopenia due to cirrhosis is improving  AKI has improved  Candida intertrigo And oral thrush On  fluconazole   Anemia has a history of vaginal bleeding due to fibroids and is on Megace  40 mg twice daily  Hypothyroidism on Synthroid   Discussed the management with the patient and ID pharmacist

## 2023-11-03 ENCOUNTER — Other Ambulatory Visit: Payer: Self-pay | Admitting: Pharmacist

## 2023-11-03 ENCOUNTER — Other Ambulatory Visit (HOSPITAL_COMMUNITY): Payer: Self-pay

## 2023-11-03 DIAGNOSIS — B957 Other staphylococcus as the cause of diseases classified elsewhere: Secondary | ICD-10-CM

## 2023-11-03 DIAGNOSIS — R7881 Bacteremia: Secondary | ICD-10-CM | POA: Diagnosis not present

## 2023-11-03 DIAGNOSIS — K7682 Hepatic encephalopathy: Secondary | ICD-10-CM | POA: Diagnosis not present

## 2023-11-03 DIAGNOSIS — K7581 Nonalcoholic steatohepatitis (NASH): Secondary | ICD-10-CM

## 2023-11-03 LAB — BASIC METABOLIC PANEL WITH GFR
Anion gap: 9 (ref 5–15)
BUN: 22 mg/dL (ref 8–23)
CO2: 19 mmol/L — ABNORMAL LOW (ref 22–32)
Calcium: 7.9 mg/dL — ABNORMAL LOW (ref 8.9–10.3)
Chloride: 111 mmol/L (ref 98–111)
Creatinine, Ser: 1.41 mg/dL — ABNORMAL HIGH (ref 0.44–1.00)
GFR, Estimated: 41 mL/min — ABNORMAL LOW (ref >60)
Glucose, Bld: 95 mg/dL (ref 70–99)
Potassium: 4.1 mmol/L (ref 3.5–5.1)
Sodium: 139 mmol/L (ref 135–145)

## 2023-11-03 MED ORDER — FLUCONAZOLE 200 MG PO TABS
200.0000 mg | ORAL_TABLET | Freq: Every day | ORAL | 0 refills | Status: AC
  Filled 2023-11-03: qty 5, 5d supply, fill #0

## 2023-11-03 MED ORDER — LINEZOLID 600 MG PO TABS
600.0000 mg | ORAL_TABLET | Freq: Two times a day (BID) | ORAL | 0 refills | Status: AC
Start: 2023-11-03 — End: 2023-11-10
  Filled 2023-11-03: qty 14, 7d supply, fill #0

## 2023-11-03 NOTE — Progress Notes (Signed)
 Discharge medications delivered to patient at the bedside in a secure bag.

## 2023-11-03 NOTE — TOC Transition Note (Signed)
 Transition of Care [facility]) - Discharge Note  Patient Details  Name: [name] MRN: [id number] Date of Birth: 01/12/1955  Transition of Care [facility]) CM/SW Contact:  Duwaine GORMAN Aran, LCSW Phone Number: 11/03/2023, 9:50 AM  Clinical Narrative: Patient will discharge home today. HH orders placed by hospitalist. CSW notified Amy with [facility] of discharge. Care management signing off.  Final next level of care: Home w Home Health Services Barriers to Discharge: Barriers Resolved  Discharge Plan and Services Additional resources added to the After Visit Summary for   In-house Referral: Clinical Social Work Post Acute Care Choice: Home Health          DME Arranged: N/A DME Agency: NA HH Agency: [facility]  Social Drivers of Health (SDOH) Interventions SDOH Screenings   Food Insecurity: No Food Insecurity (10/30/2023)  Housing: Low Risk  (10/30/2023)  Transportation Needs: No Transportation Needs (10/30/2023)  Utilities: Not At Risk (10/30/2023)  Alcohol Screen: Low Risk  (09/20/2023)  Depression (PHQ2-9): Low Risk  (10/24/2023)  Recent Concern: Depression (PHQ2-9) - Medium Risk (09/20/2023)  Financial Resource Strain: Low Risk  (09/20/2023)  Physical Activity: Inactive (09/20/2023)  Social Connections: Socially Isolated (10/30/2023)  Stress: No Stress Concern Present (09/20/2023)  Tobacco Use: Medium Risk (10/30/2023)  Health Literacy: Adequate Health Literacy (09/20/2023)   Readmission Risk Interventions    10/31/2023    8:35 AM 10/10/2023    2:52 PM 08/25/2023   10:04 AM  Readmission Risk Prevention Plan  Transportation Screening Complete Complete Complete  Medication Review Oceanographer) Complete Complete Complete  PCP or Specialist appointment within 3-5 days of discharge  Complete   HRI or Home Care Consult Complete Complete Complete  SW Recovery Care/Counseling Consult Complete Complete Complete  Palliative Care Screening Not Applicable Not Applicable Not  Applicable  Skilled Nursing Facility Not Applicable Not Applicable Not Applicable

## 2023-11-03 NOTE — Discharge Summary (Signed)
 Physician Discharge Summary  Gloris Shiroma FMW:[id number] DOB: Jun 29, 1955 DOA: 10/29/2023  PCP: [name], MD  Admit date: 10/29/2023 Discharge date: 11/03/2023  Admitted From: Home Disposition: Home with home health  Recommendations for Outpatient Follow-up:  Follow up with PCP in 1-2 weeks Please obtain CMP/CBC in one week ID clinic to schedule follow-up  Home Health: PT/OT/home health aide/RN Equipment/Devices: Available at home  Discharge Condition: Stable CODE STATUS: DNR/DNI Diet recommendation: Low-salt diet  Discharge summary: 68 year old with [name], recurrent hepatic encephalopathy, short gut syndrome, chronic diarrhea, CKD stage III AA, chronic anxiety and depression, hypothyroidism and history of PE brought to the emergency room with confusion.  She was admitted to the hospital and treated for hepatic encephalopathy with clinical improvement.  Patient also had noted some redness on the left inner thigh during admission.  A blood cultures that was obtained on admission grew 4 out of 4 Staph epidermidis.  2D echo without evidence of vegetation.  Repeat cultures negative for last 72 hours.  Patient was treated for MSSA bacteremia with Staph epidermidis.  Received 4 days of IV antibiotics.  As per ID recommendations, she is able to go home with 7 additional days of Zyvox .  Patient was also treated for oral thrush with 7 additional days of Diflucan .  Acute metabolic encephalopathy secondary to hepatic encephalopathy: Elevated ammonia level on presentation.  She was treated with high-dose lactulose , continued on rifaximin .  Mental status improved.  Currently able to maintain well on lactulose .  Patient and family well aware about use of lactulose  and increasing dose if needed to ensure 2-3 loose bowel movements a day.  She does have follow-up with hepatology.  Sepsis present on admission, on admission she was tachycardic and tachypneic with leukocytosis.  Blood cultures  with 4 out of 4 Staph epidermidis.  Patient with cellulitis left inner thigh that is already improving.  Repeat blood cultures negative.  Echocardiogram negative for evidence of endocarditis.  Treated with daptomycin.  ID recommended 1 more week of Zyvox .  Chronic medical issues: Fairly stable.  Resume all home medications.  Stable to go home today on oral antibiotics and outpatient follow-up.   Discharge Diagnoses:  Active Problems:   Liver cirrhosis secondary to NASH (HCC)   GERD (gastroesophageal reflux disease)   Hypothyroidism   Hypokalemia   DNR (do not resuscitate)/DNI(Do Not intubate)   Chronic kidney disease, stage 3a (HCC)   History of DVT (deep vein thrombosis)   Hepatic encephalopathy (HCC)   AKI (acute kidney injury)   Staphylococcus epidermidis bacteremia    Discharge Instructions  Discharge Instructions     Diet - low sodium heart healthy   Complete by: As directed    Increase activity slowly   Complete by: As directed       Allergies as of 11/03/2023       Reactions   Ace Inhibitors Anaphylaxis, Swelling   Gadolinium Derivatives Anaphylaxis   Ivp Dye [iodinated Contrast Media] Anaphylaxis   Wound Dressing Adhesive Dermatitis   Desitin [zinc  Oxide] Rash, Other (See Comments)   Worsens rash   Butrans [buprenorphine] Other (See Comments)   Discontinued due to liver function   Yellow Jacket Venom Swelling   Hibiclens  [chlorhexidine  Gluconate] Rash, Other (See Comments)   Worsens rash   Penicillins Dermatitis, Rash   Vibramycin [doxycycline] Rash   Zofran  [ondansetron ] Rash        Medication List     TAKE these medications    acetaminophen  500 MG tablet Commonly known as:  TYLENOL  Take 500-1,000 mg by mouth every 6 (six) hours as needed for headache or fever (pain).   albuterol  108 (90 Base) MCG/ACT inhaler Commonly known as: VENTOLIN  HFA Inhale 2 puffs into the lungs every 6 (six) hours as needed for shortness of breath or wheezing.    atorvastatin  40 MG tablet Commonly known as: LIPITOR TAKE 1 TABLET(40 MG) BY MOUTH IN THE MORNING   CENTRUM SILVER ULTRA WOMENS PO Take 1 tablet by mouth daily.   copper  tablet Take 2 mg by mouth in the morning and at bedtime.   cyanocobalamin  1000 MCG tablet Commonly known as: VITAMIN B12 Take 2,000 mcg by mouth every Monday, Wednesday, and Friday.   dicyclomine  10 MG capsule Commonly known as: BENTYL  Take 1 capsule (10 mg total) by mouth 3 (three) times daily as needed for spasms (abdominal cramping).   EPINEPHrine  0.3 mg/0.3 mL Soaj injection Commonly known as: EPI-PEN Inject 0.3 mg into the muscle as needed for anaphylaxis.   famotidine  20 MG tablet Commonly known as: PEPCID  Take 20 mg by mouth daily as needed for heartburn or indigestion.   fluconazole  200 MG tablet Commonly known as: DIFLUCAN  Take 1 tablet (200 mg total) by mouth daily for 5 days.   furosemide  40 MG tablet Commonly known as: LASIX  Take 1 tablet (40 mg total) by mouth 2 (two) times daily. What changed:  how much to take when to take this   HYDROcodone -acetaminophen  10-325 MG tablet Commonly known as: NORCO Take 1 tablet by mouth every 8 (eight) hours as needed (for pain).   IRON -VITAMIN C  PO Take 1 tablet by mouth daily.   lactulose  10 GM/15ML solution Commonly known as: CHRONULAC  Take 45 mLs (30 g total) by mouth 4 (four) times daily. What changed: when to take this   levothyroxine  175 MCG tablet Commonly known as: SYNTHROID  TAKE 1 TABLET BY MOUTH DAILY A 6AM. RECHECK TSH WITH PCP IN 4 WEEKS   linezolid  600 MG tablet Commonly known as: ZYVOX  Take 1 tablet (600 mg total) by mouth 2 (two) times daily for 7 days.   magnesium  gluconate 500 (27 Mg) MG Tabs tablet Commonly known as: MAGONATE Take 1 tablet (500 mg total) by mouth in the morning and at bedtime. What changed:  how much to take when to take this   megestrol  40 MG tablet Commonly known as: MEGACE  Take 1 tablet (40 mg  total) by mouth 2 (two) times daily.   methocarbamol  500 MG tablet Commonly known as: ROBAXIN  Take 500 mg by mouth daily.   midodrine  10 MG tablet Commonly known as: PROAMATINE  Take 10 mg by mouth 3 (three) times daily.   naloxone 4 MG/0.1ML Liqd nasal spray kit Commonly known as: NARCAN Place 1 spray into the nose as needed (accidental overdose).   nystatin  cream Commonly known as: MYCOSTATIN  Apply 1 Application topically 2 (two) times daily as needed for dry skin.   pantoprazole  40 MG tablet Commonly known as: PROTONIX  Take 1 tablet (40 mg total) by mouth 2 (two) times daily before a meal.   potassium chloride  10 MEQ tablet Commonly known as: KLOR-CON  Take 2 tablets (20 mEq total) by mouth 2 (two) times daily.   rifaximin  550 MG Tabs tablet Commonly known as: XIFAXAN  Take 550 mg by mouth 2 (two) times daily.   rOPINIRole  0.25 MG tablet Commonly known as: REQUIP  TAKE 1 TABLET(0.25 MG) BY MOUTH AT BEDTIME   senna-docusate 8.6-50 MG tablet Commonly known as: Senokot-S Take 1 tablet by mouth 2 (two)  times daily.   spironolactone  50 MG tablet Commonly known as: ALDACTONE  Take 50 mg by mouth daily. What changed: Another medication with the same name was removed. Continue taking this medication, and follow the directions you see here.   triamcinolone  cream 0.1 % Commonly known as: KENALOG  Apply 1 Application topically 2 (two) times daily as needed (skin irritation).   venlafaxine  XR 75 MG 24 hr capsule Commonly known as: EFFEXOR -XR Take 1 capsule (75 mg total) by mouth daily with breakfast.   Vitamin D  (Ergocalciferol ) 1.25 MG (50000 UNIT) Caps capsule Commonly known as: DRISDOL Take 50,000 Units by mouth every Wednesday.        Follow-up Information     [facility] Follow up.   Why: Leopoldo will resume PT, OT, nursing, and aide after discharge. Contact information: [address] [place] [address] [phone]                 Allergies  Allergen Reactions   Ace Inhibitors Anaphylaxis and Swelling   Gadolinium Derivatives Anaphylaxis   Ivp Dye [Iodinated Contrast Media] Anaphylaxis   Wound Dressing Adhesive Dermatitis   Desitin [Zinc  Oxide] Rash and Other (See Comments)    Worsens rash   Butrans [Buprenorphine] Other (See Comments)    Discontinued due to liver function   Yellow Jacket Venom Swelling   Hibiclens  [Chlorhexidine  Gluconate] Rash and Other (See Comments)    Worsens rash   Penicillins Dermatitis and Rash   Vibramycin [Doxycycline] Rash   Zofran  [Ondansetron ] Rash    Consultations: ID    Procedures/Studies: ECHOCARDIOGRAM COMPLETE Result Date: 10/31/2023    ECHOCARDIOGRAM REPORT   Patient Name:   ADLYN FIFE Date of Exam: 10/31/2023 Medical Rec #:  [id number]      Height:       62.0 in Accession #:    [id number]     Weight:       210.3 lb Date of Birth:  07-24-1955       BSA:          1.953 m Patient Age:    68 years       BP:           120/62 mmHg Patient Gender: F              HR:           84 bpm. Exam Location:  Inpatient Procedure: 2D Echo, Cardiac Doppler and Color Doppler (Both Spectral and Color            Flow Doppler were utilized during procedure). Indications:    Bactermia R78.81  History:        Patient has prior history of Echocardiogram examinations, most                 recent 01/03/2023. Signs/Symptoms:Murmur; Risk Factors:Former                 Smoker. H/O Hypotension, Grade I diastolic disease.  Sonographer:    BERNARDA ROCKS Referring Phys: [id number] PRANAV M PATEL IMPRESSIONS  1. Left ventricular ejection fraction, by estimation, is 55 to 60%. The left ventricle has normal function. The left ventricle has no regional wall motion abnormalities. Left ventricular diastolic parameters were normal.  2. Right ventricular systolic function is normal. The right ventricular size is moderately enlarged. Tricuspid regurgitation signal is inadequate for assessing PA pressure.  3. Left  atrial size was moderately dilated.  4. The mitral valve is degenerative. Trivial  mitral valve regurgitation. No evidence of mitral stenosis. The mean mitral valve gradient is 4.0 mmHg with average heart rate of 88 bpm. Moderate mitral annular calcification.  5. The aortic valve is tricuspid. There is moderate calcification of the aortic valve. There is moderate thickening of the aortic valve. Aortic valve regurgitation is not visualized. Moderate aortic valve stenosis.  6. There is borderline dilatation of the ascending aorta, measuring 39 mm.  7. The inferior vena cava is dilated in size with <50% respiratory variability, suggesting right atrial pressure of 15 mmHg. FINDINGS  Left Ventricle: Left ventricular ejection fraction, by estimation, is 55 to 60%. The left ventricle has normal function. The left ventricle has no regional wall motion abnormalities. The left ventricular internal cavity size was normal in size. There is  no left ventricular hypertrophy. Left ventricular diastolic parameters were normal. Right Ventricle: The right ventricular size is moderately enlarged. No increase in right ventricular wall thickness. Right ventricular systolic function is normal. Tricuspid regurgitation signal is inadequate for assessing PA pressure. Left Atrium: Left atrial size was moderately dilated. Right Atrium: Right atrial size was normal in size. Pericardium: There is no evidence of pericardial effusion. Mitral Valve: The mitral valve is degenerative in appearance. Moderate mitral annular calcification. Trivial mitral valve regurgitation. No evidence of mitral valve stenosis. MV peak gradient, 10.0 mmHg. The mean mitral valve gradient is 4.0 mmHg with average heart rate of 88 bpm. Tricuspid Valve: The tricuspid valve is normal in structure. Tricuspid valve regurgitation is not demonstrated. Aortic Valve: The aortic valve is tricuspid. There is moderate calcification of the aortic valve. There is moderate thickening  of the aortic valve. Aortic valve regurgitation is not visualized. Moderate aortic stenosis is present. Aortic valve mean gradient measures 29.0 mmHg. Aortic valve peak gradient measures 62.4 mmHg. Aortic valve area, by VTI measures 1.25 cm. Pulmonic Valve: The pulmonic valve was grossly normal. Pulmonic valve regurgitation is not visualized. No evidence of pulmonic stenosis. Aorta: The aortic root is normal in size and structure. There is borderline dilatation of the ascending aorta, measuring 39 mm. Venous: The inferior vena cava is dilated in size with less than 50% respiratory variability, suggesting right atrial pressure of 15 mmHg. IAS/Shunts: No atrial level shunt detected by color flow Doppler.  LEFT VENTRICLE PLAX 2D LVIDd:         5.20 cm      Diastology LVIDs:         2.90 cm      LV e' medial:    9.46 cm/s LV PW:         0.80 cm      LV E/e' medial:  14.2 LV IVS:        1.00 cm      LV e' lateral:   11.70 cm/s LVOT diam:     2.00 cm      LV E/e' lateral: 11.5 LV SV:         98 LV SV Index:   50 LVOT Area:     3.14 cm  LV Volumes (MOD) LV vol d, MOD A2C: 184.0 ml LV vol d, MOD A4C: 207.0 ml LV vol s, MOD A2C: 56.4 ml LV vol s, MOD A4C: 54.5 ml LV SV MOD A2C:     127.6 ml LV SV MOD A4C:     207.0 ml LV SV MOD BP:      145.6 ml RIGHT VENTRICLE             IVC  RV Basal diam:  5.00 cm     IVC diam: 2.30 cm RV S prime:     16.10 cm/s TAPSE (M-mode): 2.6 cm LEFT ATRIUM             Index        RIGHT ATRIUM           Index LA diam:        4.10 cm 2.10 cm/m   RA Area:     22.10 cm LA Vol (A2C):   75.6 ml 38.71 ml/m  RA Volume:   64.70 ml  33.13 ml/m LA Vol (A4C):   82.3 ml 42.14 ml/m LA Biplane Vol: 86.3 ml 44.19 ml/m  AORTIC VALVE                     PULMONIC VALVE AV Area (Vmax):    1.12 cm      PV Vmax:          1.73 m/s AV Area (Vmean):   1.31 cm      PV Peak grad:     12.0 mmHg AV Area (VTI):     1.25 cm      PR End Diast Vel: 1.25 msec AV Vmax:           395.00 cm/s AV Vmean:          234.333  cm/s AV VTI:            0.784 m AV Peak Grad:      62.4 mmHg AV Mean Grad:      29.0 mmHg LVOT Vmax:         141.39 cm/s LVOT Vmean:        98.059 cm/s LVOT VTI:          0.311 m LVOT/AV VTI ratio: 0.40  AORTA Ao Root diam: 3.40 cm Ao Asc diam:  3.90 cm MITRAL VALVE MV Area (PHT): 3.93 cm     SHUNTS MV Peak grad:  10.0 mmHg    Systemic VTI:  0.31 m MV Mean grad:  4.0 mmHg     Systemic Diam: 2.00 cm MV Vmax:       1.58 m/s MV Vmean:      96.9 cm/s MV E velocity: 134.00 cm/s MV A velocity: 165.00 cm/s MV E/A ratio:  0.81 Mihai Croitoru MD Electronically signed by Jerel Balding MD Signature Date/Time: 10/31/2023/3:35:53 PM    Final    CT Head Wo Contrast Result Date: 10/30/2023 EXAM: CT HEAD WITHOUT CONTRAST 10/30/2023 12:03:14 AM TECHNIQUE: CT of the head was performed without the administration of intravenous contrast. Automated exposure control, iterative reconstruction, and/or weight based adjustment of the mA/kV was utilized to reduce the radiation dose to as low as reasonably achievable. COMPARISON: 08/24/2023 CLINICAL HISTORY: Headache, new onset (Age >= 51y). Pt unable to come at this time. 2238. FINDINGS: BRAIN AND VENTRICLES: No acute hemorrhage. No evidence of acute infarct. Patchy and confluent areas of decreased attenuation are noted throughout the deep and periventricular white matter of the cerebral hemispheres bilaterally suggestive of chronic microvascular ischemic changes. Atherosclerotic calcifications are present within the cavernous internal carotid and vertebral arteries. No hydrocephalus. No extra-axial collection. No mass effect or midline shift. ORBITS: No acute abnormality. SINUSES: No acute abnormality. SOFT TISSUES AND SKULL: No acute soft tissue abnormality. No skull fracture. IMPRESSION: 1. No acute intracranial abnormality. Electronically signed by: Morgane Naveau MD 10/30/2023 12:13 AM EDT RP Workstation: [id number]   CT ABDOMEN PELVIS WO  CONTRAST Result Date: 10/29/2023 EXAM:  CT ABDOMEN AND PELVIS WITHOUT CONTRAST 10/29/2023 09:24:54 PM TECHNIQUE: CT of the abdomen and pelvis was performed without the administration of intravenous contrast. Multiplanar reformatted images are provided for review. Automated exposure control, iterative reconstruction, and/or weight-based adjustment of the mA/kV was utilized to reduce the radiation dose to as low as reasonably achievable. COMPARISON: Comparison with 08/24/2023. CLINICAL HISTORY: Abdominal pain, acute, nonlocalized. FINDINGS: LOWER CHEST: No acute abnormality. LIVER: Nodularity of the hepatic contour compatible with cirrhosis. GALLBLADDER AND BILE DUCTS: Cholecystectomy. No biliary ductal dilatation. SPLEEN: Borderline splenomegaly measuring 14.6 cm in greatest dimension. PANCREAS: No acute abnormality. ADRENAL GLANDS: No acute abnormality. KIDNEYS, URETERS AND BLADDER: Nonobstructing right nephrolithiasis. No stones in the left kidney or ureters. No hydronephrosis. No perinephric or periureteral stranding. Urinary bladder is unremarkable. GI AND BOWEL: Stomach demonstrates no acute abnormality. There is no bowel obstruction. PERITONEUM AND RETROPERITONEUM: No ascites. No free air. VASCULATURE: Aorta is normal in caliber. Aortic atherosclerotic calcification. LYMPH NODES: No lymphadenopathy. REPRODUCTIVE ORGANS: Calcified uterine fibroids. BONES AND SOFT TISSUES: T12 vertebroplasty. Rectus diastasis in the midline ventral abdominal wall. Additional left paramidline low anterior abdominal wall hernia containing fat. No acute osseous abnormality. IMPRESSION: 1. No acute findings in the abdomen or pelvis. 2. Nodularity of the hepatic contour compatible with cirrhosis with borderline splenomegaly . Electronically signed by: Norman Gatlin MD 10/29/2023 09:39 PM EDT RP Workstation: [id number]   DG Chest Port 1 View Result Date: 10/29/2023 EXAM: 1 VIEW XRAY OF THE CHEST 10/29/2023 08:17:00 PM COMPARISON: Chest x-ray 08/24/2023. CT chest  06/28/2023. CLINICAL HISTORY: Cough 10031. RLQ abdominal pain (persistent since release 4 days ago), slight AMS. FINDINGS: LUNGS AND PLEURA: Low lung volumes. No focal pulmonary opacity. No pulmonary edema. No pleural effusion. No pneumothorax. HEART AND MEDIASTINUM: Unchanged cardiomediastinal silhouette. Atherosclerotic plaque. BONES AND SOFT TISSUES: No acute osseous abnormality. IMPRESSION: 1. Low lung volumes with no acute cardiopulmonary process. Electronically signed by: Morgane Naveau MD 10/29/2023 08:28 PM EDT RP Workstation: [id number]   (Echo, Carotid, EGD, Colonoscopy, ERCP)    Subjective: Patient seen in the morning rounds.  Her son was at the bedside.  Patient denies any complaints.  She is eager to go home.   Discharge Exam: Vitals:   11/03/23 0516 11/03/23 0807  BP: 130/81 116/60  Pulse: 66 75  Resp:    Temp: 98.1 F (36.7 C)   SpO2: 100%    Vitals:   11/02/23 1610 11/02/23 1953 11/03/23 0516 11/03/23 0807  BP: (!) 105/45 132/79 130/81 116/60  Pulse: 68 65 66 75  Resp:      Temp:  98.2 F (36.8 C) 98.1 F (36.7 C)   TempSrc:  Oral Oral   SpO2:  100% 100%   Weight:      Height:        General: Pt is alert, awake, not in acute distress Alert awake and oriented.  She has no evidence of tremors or asterixis. Cardiovascular: RRR, S1/S2 +, no rubs, no gallops Respiratory: CTA bilaterally, no wheezing, no rhonchi Abdominal: Soft, NT, ND, bowel sounds + Extremities: no edema, no cyanosis    The results of significant diagnostics from this hospitalization (including imaging, microbiology, ancillary and laboratory) are listed below for reference.     Microbiology: Recent Results (from the past 240 hours)  Blood Culture (routine x 2)     Status: Abnormal   Collection Time: 10/29/23  8:30 PM   Specimen: BLOOD  Result Value Ref Range Status   Specimen  Description   Final    BLOOD LEFT ANTECUBITAL Performed at [facility], [address], [place], [place] [address]    Special Requests   Final    BOTTLES DRAWN AEROBIC AND ANAEROBIC Blood Culture results may not be optimal due to an inadequate volume of blood received in culture bottles Performed at [facility], [address], [place], [place] [address]    Culture  Setup Time   Final    GRAM POSITIVE COCCI IN BOTH AEROBIC AND ANAEROBIC BOTTLES CRITICAL VALUE NOTED.  VALUE IS CONSISTENT WITH PREVIOUSLY REPORTED AND CALLED VALUE. Performed at [facility], [address], [place], [place] [address]    Culture STAPHYLOCOCCUS EPIDERMIDIS (A)  Final   Report Status 11/01/2023 FINAL  Final   Organism ID, Bacteria STAPHYLOCOCCUS EPIDERMIDIS  Final      Susceptibility   Staphylococcus epidermidis - MIC*    CIPROFLOXACIN  >=8 RESISTANT Resistant     ERYTHROMYCIN >=8 RESISTANT Resistant     GENTAMICIN <=0.5 SENSITIVE Sensitive     OXACILLIN >=4 RESISTANT Resistant     TETRACYCLINE 2 SENSITIVE Sensitive     VANCOMYCIN  1 SENSITIVE Sensitive     TRIMETH /SULFA  80 RESISTANT Resistant     CLINDAMYCIN  >=8 RESISTANT Resistant     RIFAMPIN >=32 RESISTANT Resistant     Inducible Clindamycin  NEGATIVE Sensitive     * STAPHYLOCOCCUS EPIDERMIDIS  Blood Culture (routine x 2)     Status: Abnormal   Collection Time: 10/29/23  8:34 PM   Specimen: BLOOD RIGHT FOREARM  Result Value Ref Range Status   Specimen Description   Final    BLOOD RIGHT FOREARM Performed at [facility], [address], [place], [place] [address]    Special Requests   Final    BOTTLES DRAWN AEROBIC AND ANAEROBIC Blood Culture results may not be optimal due to an inadequate volume of blood received in culture bottles Performed at [facility], [address], [place], [place] [address]    Culture  Setup Time   Final    GRAM POSITIVE COCCI IN CLUSTERS IN BOTH AEROBIC AND ANAEROBIC BOTTLES CRITICAL RESULT CALLED TO, READ BACK BY AND VERIFIED WITH: PHARMD M LILLISTON  10/30/2023 @ 2140 BY AB Performed at [facility], [address], [place], [place] [address]    Culture STAPHYLOCOCCUS EPIDERMIDIS (A)  Final   Report Status 11/01/2023 FINAL  Final   Organism ID, Bacteria STAPHYLOCOCCUS EPIDERMIDIS  Final      Susceptibility   Staphylococcus epidermidis - MIC*    CIPROFLOXACIN  >=8 RESISTANT Resistant     ERYTHROMYCIN >=8 RESISTANT Resistant     GENTAMICIN <=0.5 SENSITIVE Sensitive     OXACILLIN >=4 RESISTANT Resistant     TETRACYCLINE 2 SENSITIVE Sensitive     VANCOMYCIN  1 SENSITIVE Sensitive     TRIMETH /SULFA  80 RESISTANT Resistant     CLINDAMYCIN  >=8 RESISTANT Resistant     RIFAMPIN >=32 RESISTANT Resistant     Inducible Clindamycin  NEGATIVE Sensitive     * STAPHYLOCOCCUS EPIDERMIDIS  Blood Culture ID Panel (Reflexed)     Status: Abnormal   Collection Time: 10/29/23  8:34 PM  Result Value Ref Range Status   Enterococcus faecalis NOT DETECTED NOT DETECTED Final   Enterococcus Faecium NOT DETECTED NOT DETECTED Final   Listeria monocytogenes NOT DETECTED NOT DETECTED Final   Staphylococcus species DETECTED (A) NOT DETECTED Final    Comment: CRITICAL RESULT CALLED TO, READ BACK  BY AND VERIFIED WITH: PHARMD M LILLISTON 10/30/2023 @ 2140 BY AB    Staphylococcus aureus (BCID) NOT DETECTED NOT DETECTED Final   Staphylococcus epidermidis DETECTED (A) NOT DETECTED Final    Comment: Methicillin (oxacillin) resistant coagulase negative staphylococcus. Possible blood culture contaminant (unless isolated from more than one blood culture draw or clinical case suggests pathogenicity). No antibiotic treatment is indicated for blood  culture contaminants. CRITICAL RESULT CALLED TO, READ BACK BY AND VERIFIED WITH: PHARMD M LILLISTON 10/30/2023 @ 2140 BY AB    Staphylococcus lugdunensis NOT DETECTED NOT DETECTED Final   Streptococcus species NOT DETECTED NOT DETECTED Final   Streptococcus agalactiae NOT DETECTED NOT DETECTED Final   Streptococcus  pneumoniae NOT DETECTED NOT DETECTED Final   Streptococcus pyogenes NOT DETECTED NOT DETECTED Final   A.calcoaceticus-baumannii NOT DETECTED NOT DETECTED Final   Bacteroides fragilis NOT DETECTED NOT DETECTED Final   Enterobacterales NOT DETECTED NOT DETECTED Final   Enterobacter cloacae complex NOT DETECTED NOT DETECTED Final   Escherichia coli NOT DETECTED NOT DETECTED Final   Klebsiella aerogenes NOT DETECTED NOT DETECTED Final   Klebsiella oxytoca NOT DETECTED NOT DETECTED Final   Klebsiella pneumoniae NOT DETECTED NOT DETECTED Final   Proteus species NOT DETECTED NOT DETECTED Final   Salmonella species NOT DETECTED NOT DETECTED Final   Serratia marcescens NOT DETECTED NOT DETECTED Final   Haemophilus influenzae NOT DETECTED NOT DETECTED Final   Neisseria meningitidis NOT DETECTED NOT DETECTED Final   Pseudomonas aeruginosa NOT DETECTED NOT DETECTED Final   Stenotrophomonas maltophilia NOT DETECTED NOT DETECTED Final   Candida albicans NOT DETECTED NOT DETECTED Final   Candida auris NOT DETECTED NOT DETECTED Final   Candida glabrata NOT DETECTED NOT DETECTED Final   Candida krusei NOT DETECTED NOT DETECTED Final   Candida parapsilosis NOT DETECTED NOT DETECTED Final   Candida tropicalis NOT DETECTED NOT DETECTED Final   Cryptococcus neoformans/gattii NOT DETECTED NOT DETECTED Final   Methicillin resistance mecA/C DETECTED (A) NOT DETECTED Final    Comment: CRITICAL RESULT CALLED TO, READ BACK BY AND VERIFIED WITH: PHARMD M LILLISTON 10/30/2023 @ 2140 BY AB Performed at [facility], [address], [place], [place] [address]   Resp panel by RT-PCR (RSV, Flu A&B, Covid) Anterior Nasal Swab     Status: None   Collection Time: 10/29/23 10:28 PM   Specimen: Anterior Nasal Swab  Result Value Ref Range Status   SARS Coronavirus 2 by RT PCR NEGATIVE NEGATIVE Final    Comment: (NOTE) SARS-CoV-2 target nucleic acids are NOT DETECTED.  The SARS-CoV-2 RNA is generally detectable  in upper respiratory specimens during the acute phase of infection. The lowest concentration of SARS-CoV-2 viral copies this assay can detect is 138 copies/mL. A negative result does not preclude SARS-Cov-2 infection and should not be used as the sole basis for treatment or other patient management decisions. A negative result may occur with  improper specimen collection/handling, submission of specimen other than nasopharyngeal swab, presence of viral mutation(s) within the areas targeted by this assay, and inadequate number of viral copies(<138 copies/mL). A negative result must be combined with clinical observations, patient history, and epidemiological information. The expected result is Negative.  Fact Sheet for Patients:  BloggerCourse.com  Fact Sheet for Healthcare Providers:  SeriousBroker.it  This test is no t yet approved or cleared by the [place]  FDA and  has been authorized for detection and/or diagnosis of SARS-CoV-2 by FDA under an Emergency Use Authorization (EUA). This EUA will  remain  in effect (meaning this test can be used) for the duration of the COVID-19 declaration under Section 564(b)(1) of the Act, 21 U.S.C.section 360bbb-3(b)(1), unless the authorization is terminated  or revoked sooner.       Influenza A by PCR NEGATIVE NEGATIVE Final   Influenza B by PCR NEGATIVE NEGATIVE Final    Comment: (NOTE) The Xpert Xpress SARS-CoV-2/FLU/RSV plus assay is intended as an aid in the diagnosis of influenza from Nasopharyngeal swab specimens and should not be used as a sole basis for treatment. Nasal washings and aspirates are unacceptable for Xpert Xpress SARS-CoV-2/FLU/RSV testing.  Fact Sheet for Patients: BloggerCourse.com  Fact Sheet for Healthcare Providers: SeriousBroker.it  This test is not yet approved or cleared by the [place]  FDA and has  been authorized for detection and/or diagnosis of SARS-CoV-2 by FDA under an Emergency Use Authorization (EUA). This EUA will remain in effect (meaning this test can be used) for the duration of the COVID-19 declaration under Section 564(b)(1) of the Act, 21 U.S.C. section 360bbb-3(b)(1), unless the authorization is terminated or revoked.     Resp Syncytial Virus by PCR NEGATIVE NEGATIVE Final    Comment: (NOTE) Fact Sheet for Patients: BloggerCourse.com  Fact Sheet for Healthcare Providers: SeriousBroker.it  This test is not yet approved or cleared by the [place]  FDA and has been authorized for detection and/or diagnosis of SARS-CoV-2 by FDA under an Emergency Use Authorization (EUA). This EUA will remain in effect (meaning this test can be used) for the duration of the COVID-19 declaration under Section 564(b)(1) of the Act, 21 U.S.C. section 360bbb-3(b)(1), unless the authorization is terminated or revoked.  Performed at [facility], [address] [place], [place] [address]   Urine Culture     Status: None   Collection Time: 10/30/23  5:00 AM   Specimen: Urine, Clean Catch  Result Value Ref Range Status   Specimen Description   Final    URINE, CLEAN CATCH Performed at [facility], [address] [place], [place] [address]    Special Requests   Final    NONE Performed at [facility], [address] [place], [place] [address]    Culture   Final    NO GROWTH Performed at [facility], [address] [place], [place] [address]    Report Status 10/31/2023 FINAL  Final  Gastrointestinal Panel by PCR , Stool     Status: None   Collection Time: 10/30/23  1:47 PM   Specimen: Stool  Result Value Ref Range Status   Campylobacter species NOT DETECTED NOT DETECTED Final   Plesimonas shigelloides NOT DETECTED NOT DETECTED Final   Salmonella species NOT  DETECTED NOT DETECTED Final   Yersinia enterocolitica NOT DETECTED NOT DETECTED Final   Vibrio species NOT DETECTED NOT DETECTED Final   Vibrio cholerae NOT DETECTED NOT DETECTED Final   Enteroaggregative E coli (EAEC) NOT DETECTED NOT DETECTED Final   Enteropathogenic E coli (EPEC) NOT DETECTED NOT DETECTED Final   Enterotoxigenic E coli (ETEC) NOT DETECTED NOT DETECTED Final   Shiga like toxin producing E coli (STEC) NOT DETECTED NOT DETECTED Final   Shigella/Enteroinvasive E coli (EIEC) NOT DETECTED NOT DETECTED Final   Cryptosporidium NOT DETECTED NOT DETECTED Final   Cyclospora cayetanensis NOT DETECTED NOT DETECTED Final   Entamoeba histolytica NOT DETECTED NOT DETECTED Final   Giardia lamblia NOT DETECTED NOT DETECTED Final   Adenovirus F40/41 NOT DETECTED NOT DETECTED Final  Astrovirus NOT DETECTED NOT DETECTED Final   Norovirus GI/GII NOT DETECTED NOT DETECTED Final   Rotavirus A NOT DETECTED NOT DETECTED Final   Sapovirus (I, II, IV, and V) NOT DETECTED NOT DETECTED Final    Comment: Performed at [facility], [address] [place], [place] [address]  Culture, blood (Routine X 2) w Reflex to ID Panel     Status: None (Preliminary result)   Collection Time: 10/31/23  5:09 AM   Specimen: BLOOD RIGHT HAND  Result Value Ref Range Status   Specimen Description   Final    BLOOD RIGHT HAND Performed at [facility], [address], [place], [place] [address]    Special Requests   Final    BOTTLES DRAWN AEROBIC AND ANAEROBIC Blood Culture results may not be optimal due to an inadequate volume of blood received in culture bottles Performed at [facility], [address] [place], [place] [address]    Culture   Final    NO GROWTH 3 DAYS Performed at [facility], [address], [place], [place] [address]    Report Status PENDING  Incomplete  Culture, blood (Routine X 2) w Reflex to ID Panel     Status: None (Preliminary result)    Collection Time: 10/31/23  5:09 AM   Specimen: BLOOD  Result Value Ref Range Status   Specimen Description   Final    BLOOD LEFT ANTECUBITAL Performed at [facility], [address] [place], [place] [address]    Special Requests   Final    BOTTLES DRAWN AEROBIC AND ANAEROBIC Blood Culture results may not be optimal due to an inadequate volume of blood received in culture bottles Performed at [facility], [address], [place], [place] [address]    Culture   Final    NO GROWTH 3 DAYS Performed at [facility], [address] [place], [place] [address]    Report Status PENDING  Incomplete     Labs: BNP (last 3 results) Recent Labs    01/03/23 0616 08/24/23 1544  BNP 469.4* 72.9   Basic Metabolic Panel: Recent Labs  Lab 10/29/23 2000 10/30/23 0530 11/01/23 0533 11/02/23 0523 11/03/23 0532  NA 141 140 141 143 139  K 3.4* 3.8 4.4 3.8 4.1  CL 112* 113* 116* 116* 111  CO2 15* 14* 16* 17* 19*  GLUCOSE 149* 93 79 144* 95  BUN 29* 30* 29* 25* 22  CREATININE 1.57* 1.35* 1.47* 1.28* 1.41*  CALCIUM  9.2 8.3* 8.1* 7.8* 7.9*  MG  --   --  1.8  --   --    Liver Function Tests: Recent Labs  Lab 10/29/23 2000 10/30/23 0530  AST 39 34  ALT 30 25  ALKPHOS 114 79  BILITOT 1.7* 1.6*  PROT 6.4* 4.5*  ALBUMIN  3.6 2.7*   Recent Labs  Lab 10/29/23 2000  LIPASE 46   Recent Labs  Lab 10/29/23 2054 11/02/23 0827  AMMONIA 67* 52*   CBC: Recent Labs  Lab 10/29/23 2000 10/30/23 0530 11/02/23 0523  WBC 12.8* 11.6* 6.0  HGB 14.0 10.8* 10.2*  HCT 45.2 34.1* 33.8*  MCV 100.0 99.4 100.6*  PLT 131* 80* 109*   Cardiac Enzymes: Recent Labs  Lab 11/01/23 1321  CKTOTAL 50   BNP: Invalid input(s): POCBNP CBG: No results for input(s): GLUCAP in the last 168 hours. D-Dimer No results for input(s): DDIMER in the last 72 hours. Hgb  A1c No results for input(s): HGBA1C in the last 72 hours. Lipid Profile No results  for input(s): CHOL, HDL, LDLCALC, TRIG, CHOLHDL, LDLDIRECT in the last 72 hours. Thyroid  function studies No results for input(s): TSH, T4TOTAL, T3FREE, THYROIDAB in the last 72 hours.  Invalid input(s): FREET3 Anemia work up No results for input(s): VITAMINB12, FOLATE, FERRITIN, TIBC, IRON , RETICCTPCT in the last 72 hours. Urinalysis    Component Value Date/Time   COLORURINE YELLOW 10/30/2023 0500   APPEARANCEUR HAZY (A) 10/30/2023 0500   LABSPEC 1.018 10/30/2023 0500   PHURINE 5.0 10/30/2023 0500   GLUCOSEU NEGATIVE 10/30/2023 0500   HGBUR NEGATIVE 10/30/2023 0500   BILIRUBINUR NEGATIVE 10/30/2023 0500   KETONESUR NEGATIVE 10/30/2023 0500   PROTEINUR NEGATIVE 10/30/2023 0500   UROBILINOGEN 0.2 08/21/2013 0017   NITRITE NEGATIVE 10/30/2023 0500   LEUKOCYTESUR TRACE (A) 10/30/2023 0500   Sepsis Labs Recent Labs  Lab 10/29/23 2000 10/30/23 0530 11/02/23 0523  WBC 12.8* 11.6* 6.0   Microbiology Recent Results (from the past 240 hours)  Blood Culture (routine x 2)     Status: Abnormal   Collection Time: 10/29/23  8:30 PM   Specimen: BLOOD  Result Value Ref Range Status   Specimen Description   Final    BLOOD LEFT ANTECUBITAL Performed at [facility], [address], [place], [place] [address]    Special Requests   Final    BOTTLES DRAWN AEROBIC AND ANAEROBIC Blood Culture results may not be optimal due to an inadequate volume of blood received in culture bottles Performed at [facility], [address], [place], [place] [address]    Culture  Setup Time   Final    GRAM POSITIVE COCCI IN BOTH AEROBIC AND ANAEROBIC BOTTLES CRITICAL VALUE NOTED.  VALUE IS CONSISTENT WITH PREVIOUSLY REPORTED AND CALLED VALUE. Performed at [facility], [address], [place], [place] [address]    Culture STAPHYLOCOCCUS EPIDERMIDIS (A)  Final   Report Status 11/01/2023 FINAL  Final   Organism ID, Bacteria  STAPHYLOCOCCUS EPIDERMIDIS  Final      Susceptibility   Staphylococcus epidermidis - MIC*    CIPROFLOXACIN  >=8 RESISTANT Resistant     ERYTHROMYCIN >=8 RESISTANT Resistant     GENTAMICIN <=0.5 SENSITIVE Sensitive     OXACILLIN >=4 RESISTANT Resistant     TETRACYCLINE 2 SENSITIVE Sensitive     VANCOMYCIN  1 SENSITIVE Sensitive     TRIMETH /SULFA  80 RESISTANT Resistant     CLINDAMYCIN  >=8 RESISTANT Resistant     RIFAMPIN >=32 RESISTANT Resistant     Inducible Clindamycin  NEGATIVE Sensitive     * STAPHYLOCOCCUS EPIDERMIDIS  Blood Culture (routine x 2)     Status: Abnormal   Collection Time: 10/29/23  8:34 PM   Specimen: BLOOD RIGHT FOREARM  Result Value Ref Range Status   Specimen Description   Final    BLOOD RIGHT FOREARM Performed at [facility], [address] [place], [place] [address]    Special Requests   Final    BOTTLES DRAWN AEROBIC AND ANAEROBIC Blood Culture results may not be optimal due to an inadequate volume of blood received in culture bottles Performed at [facility], [address], [place], [place] [address]    Culture  Setup Time   Final    GRAM POSITIVE COCCI IN CLUSTERS IN BOTH AEROBIC AND ANAEROBIC BOTTLES CRITICAL RESULT CALLED TO, READ BACK BY AND VERIFIED WITH: PHARMD M LILLISTON 10/30/2023 @ 2140 BY AB  Performed at [facility], [address] [place], [place] [address]    Culture STAPHYLOCOCCUS EPIDERMIDIS (A)  Final   Report Status 11/01/2023 FINAL  Final   Organism ID, Bacteria STAPHYLOCOCCUS EPIDERMIDIS  Final      Susceptibility   Staphylococcus epidermidis - MIC*    CIPROFLOXACIN  >=8 RESISTANT Resistant     ERYTHROMYCIN >=8 RESISTANT Resistant     GENTAMICIN <=0.5 SENSITIVE Sensitive     OXACILLIN >=4 RESISTANT Resistant     TETRACYCLINE 2 SENSITIVE Sensitive     VANCOMYCIN  1 SENSITIVE Sensitive     TRIMETH /SULFA  80 RESISTANT Resistant     CLINDAMYCIN  >=8 RESISTANT Resistant     RIFAMPIN >=32 RESISTANT  Resistant     Inducible Clindamycin  NEGATIVE Sensitive     * STAPHYLOCOCCUS EPIDERMIDIS  Blood Culture ID Panel (Reflexed)     Status: Abnormal   Collection Time: 10/29/23  8:34 PM  Result Value Ref Range Status   Enterococcus faecalis NOT DETECTED NOT DETECTED Final   Enterococcus Faecium NOT DETECTED NOT DETECTED Final   Listeria monocytogenes NOT DETECTED NOT DETECTED Final   Staphylococcus species DETECTED (A) NOT DETECTED Final    Comment: CRITICAL RESULT CALLED TO, READ BACK BY AND VERIFIED WITH: PHARMD M LILLISTON 10/30/2023 @ 2140 BY AB    Staphylococcus aureus (BCID) NOT DETECTED NOT DETECTED Final   Staphylococcus epidermidis DETECTED (A) NOT DETECTED Final    Comment: Methicillin (oxacillin) resistant coagulase negative staphylococcus. Possible blood culture contaminant (unless isolated from more than one blood culture draw or clinical case suggests pathogenicity). No antibiotic treatment is indicated for blood  culture contaminants. CRITICAL RESULT CALLED TO, READ BACK BY AND VERIFIED WITH: PHARMD M LILLISTON 10/30/2023 @ 2140 BY AB    Staphylococcus lugdunensis NOT DETECTED NOT DETECTED Final   Streptococcus species NOT DETECTED NOT DETECTED Final   Streptococcus agalactiae NOT DETECTED NOT DETECTED Final   Streptococcus pneumoniae NOT DETECTED NOT DETECTED Final   Streptococcus pyogenes NOT DETECTED NOT DETECTED Final   A.calcoaceticus-baumannii NOT DETECTED NOT DETECTED Final   Bacteroides fragilis NOT DETECTED NOT DETECTED Final   Enterobacterales NOT DETECTED NOT DETECTED Final   Enterobacter cloacae complex NOT DETECTED NOT DETECTED Final   Escherichia coli NOT DETECTED NOT DETECTED Final   Klebsiella aerogenes NOT DETECTED NOT DETECTED Final   Klebsiella oxytoca NOT DETECTED NOT DETECTED Final   Klebsiella pneumoniae NOT DETECTED NOT DETECTED Final   Proteus species NOT DETECTED NOT DETECTED Final   Salmonella species NOT DETECTED NOT DETECTED Final   Serratia  marcescens NOT DETECTED NOT DETECTED Final   Haemophilus influenzae NOT DETECTED NOT DETECTED Final   Neisseria meningitidis NOT DETECTED NOT DETECTED Final   Pseudomonas aeruginosa NOT DETECTED NOT DETECTED Final   Stenotrophomonas maltophilia NOT DETECTED NOT DETECTED Final   Candida albicans NOT DETECTED NOT DETECTED Final   Candida auris NOT DETECTED NOT DETECTED Final   Candida glabrata NOT DETECTED NOT DETECTED Final   Candida krusei NOT DETECTED NOT DETECTED Final   Candida parapsilosis NOT DETECTED NOT DETECTED Final   Candida tropicalis NOT DETECTED NOT DETECTED Final   Cryptococcus neoformans/gattii NOT DETECTED NOT DETECTED Final   Methicillin resistance mecA/C DETECTED (A) NOT DETECTED Final    Comment: CRITICAL RESULT CALLED TO, READ BACK BY AND VERIFIED WITH: PHARMD M LILLISTON 10/30/2023 @ 2140 BY AB Performed at [facility], [address], [place], [place] [address]   Resp panel by RT-PCR (RSV, Flu A&B, Covid) Anterior Nasal Swab  Status: None   Collection Time: 10/29/23 10:28 PM   Specimen: Anterior Nasal Swab  Result Value Ref Range Status   SARS Coronavirus 2 by RT PCR NEGATIVE NEGATIVE Final    Comment: (NOTE) SARS-CoV-2 target nucleic acids are NOT DETECTED.  The SARS-CoV-2 RNA is generally detectable in upper respiratory specimens during the acute phase of infection. The lowest concentration of SARS-CoV-2 viral copies this assay can detect is 138 copies/mL. A negative result does not preclude SARS-Cov-2 infection and should not be used as the sole basis for treatment or other patient management decisions. A negative result may occur with  improper specimen collection/handling, submission of specimen other than nasopharyngeal swab, presence of viral mutation(s) within the areas targeted by this assay, and inadequate number of viral copies(<138 copies/mL). A negative result must be combined with clinical observations, patient history, and  epidemiological information. The expected result is Negative.  Fact Sheet for Patients:  BloggerCourse.com  Fact Sheet for Healthcare Providers:  SeriousBroker.it  This test is no t yet approved or cleared by the [place]  FDA and  has been authorized for detection and/or diagnosis of SARS-CoV-2 by FDA under an Emergency Use Authorization (EUA). This EUA will remain  in effect (meaning this test can be used) for the duration of the COVID-19 declaration under Section 564(b)(1) of the Act, 21 U.S.C.section 360bbb-3(b)(1), unless the authorization is terminated  or revoked sooner.       Influenza A by PCR NEGATIVE NEGATIVE Final   Influenza B by PCR NEGATIVE NEGATIVE Final    Comment: (NOTE) The Xpert Xpress SARS-CoV-2/FLU/RSV plus assay is intended as an aid in the diagnosis of influenza from Nasopharyngeal swab specimens and should not be used as a sole basis for treatment. Nasal washings and aspirates are unacceptable for Xpert Xpress SARS-CoV-2/FLU/RSV testing.  Fact Sheet for Patients: BloggerCourse.com  Fact Sheet for Healthcare Providers: SeriousBroker.it  This test is not yet approved or cleared by the [place]  FDA and has been authorized for detection and/or diagnosis of SARS-CoV-2 by FDA under an Emergency Use Authorization (EUA). This EUA will remain in effect (meaning this test can be used) for the duration of the COVID-19 declaration under Section 564(b)(1) of the Act, 21 U.S.C. section 360bbb-3(b)(1), unless the authorization is terminated or revoked.     Resp Syncytial Virus by PCR NEGATIVE NEGATIVE Final    Comment: (NOTE) Fact Sheet for Patients: BloggerCourse.com  Fact Sheet for Healthcare Providers: SeriousBroker.it  This test is not yet approved or cleared by the [place]  FDA and has been  authorized for detection and/or diagnosis of SARS-CoV-2 by FDA under an Emergency Use Authorization (EUA). This EUA will remain in effect (meaning this test can be used) for the duration of the COVID-19 declaration under Section 564(b)(1) of the Act, 21 U.S.C. section 360bbb-3(b)(1), unless the authorization is terminated or revoked.  Performed at [facility], [address] [place], [place] [address]   Urine Culture     Status: None   Collection Time: 10/30/23  5:00 AM   Specimen: Urine, Clean Catch  Result Value Ref Range Status   Specimen Description   Final    URINE, CLEAN CATCH Performed at [facility], [address], [place], [place] [address]    Special Requests   Final    NONE Performed at [facility], [address], [place], [place] [address]    Culture   Final    NO GROWTH Performed at [facility], [address] [place], [place] [address]    Report Status 10/31/2023 FINAL  Final  Gastrointestinal Panel by PCR , Stool     Status: None   Collection Time: 10/30/23  1:47 PM   Specimen: Stool  Result Value Ref Range Status   Campylobacter species NOT DETECTED NOT DETECTED Final   Plesimonas shigelloides NOT DETECTED NOT DETECTED Final   Salmonella species NOT DETECTED NOT DETECTED Final   Yersinia enterocolitica NOT DETECTED NOT DETECTED Final   Vibrio species NOT DETECTED NOT DETECTED Final   Vibrio cholerae NOT DETECTED NOT DETECTED Final   Enteroaggregative E coli (EAEC) NOT DETECTED NOT DETECTED Final   Enteropathogenic E coli (EPEC) NOT DETECTED NOT DETECTED Final   Enterotoxigenic E coli (ETEC) NOT DETECTED NOT DETECTED Final   Shiga like toxin producing E coli (STEC) NOT DETECTED NOT DETECTED Final   Shigella/Enteroinvasive E coli (EIEC) NOT DETECTED NOT DETECTED Final   Cryptosporidium NOT DETECTED NOT DETECTED Final   Cyclospora cayetanensis NOT DETECTED NOT DETECTED Final   Entamoeba  histolytica NOT DETECTED NOT DETECTED Final   Giardia lamblia NOT DETECTED NOT DETECTED Final   Adenovirus F40/41 NOT DETECTED NOT DETECTED Final   Astrovirus NOT DETECTED NOT DETECTED Final   Norovirus GI/GII NOT DETECTED NOT DETECTED Final   Rotavirus A NOT DETECTED NOT DETECTED Final   Sapovirus (I, II, IV, and V) NOT DETECTED NOT DETECTED Final    Comment: Performed at [facility], [address] [place], [place] [address]  Culture, blood (Routine X 2) w Reflex to ID Panel     Status: None (Preliminary result)   Collection Time: 10/31/23  5:09 AM   Specimen: BLOOD RIGHT HAND  Result Value Ref Range Status   Specimen Description   Final    BLOOD RIGHT HAND Performed at [facility], [address], [place], [place] [address]    Special Requests   Final    BOTTLES DRAWN AEROBIC AND ANAEROBIC Blood Culture results may not be optimal due to an inadequate volume of blood received in culture bottles Performed at [facility], [address], [place], [place] [address]    Culture   Final    NO GROWTH 3 DAYS Performed at [facility], [address] [place], [place] [address]    Report Status PENDING  Incomplete  Culture, blood (Routine X 2) w Reflex to ID Panel     Status: None (Preliminary result)   Collection Time: 10/31/23  5:09 AM   Specimen: BLOOD  Result Value Ref Range Status   Specimen Description   Final    BLOOD LEFT ANTECUBITAL Performed at [facility], [address], [place], [place] [address]    Special Requests   Final    BOTTLES DRAWN AEROBIC AND ANAEROBIC Blood Culture results may not be optimal due to an inadequate volume of blood received in culture bottles Performed at [facility], [address], [place], [place] [address]    Culture   Final    NO GROWTH 3 DAYS Performed at [facility], [address], [place], [place] [address]    Report Status PENDING  Incomplete      Time coordinating discharge: 35 minutes  SIGNED:   Renato Applebaum, MD  [facility] 11/03/2023, 9:25 AM

## 2023-11-03 NOTE — Progress Notes (Signed)
 Mobility Specialist Progress Note:   11/03/23 0950  Mobility  Activity Ambulated with assistance  Level of Assistance Contact guard assist, steadying assist  Assistive Device Front wheel walker  Distance Ambulated (ft) 200 ft  Activity Response Tolerated well  Mobility Referral Yes  Mobility visit 1 Mobility  Mobility Specialist Start Time (ACUTE ONLY) 0910  Mobility Specialist Stop Time (ACUTE ONLY) 0930  Mobility Specialist Time Calculation (min) (ACUTE ONLY) 20 min   Pt was received in bed and agreed to mobility. 2x small standing breaks during ambulation. Returned to bed with all needs met. Call bell in reach. Left in room with family.   [employer] - Mobility Specialist

## 2023-11-03 NOTE — Progress Notes (Signed)
 ID Pharmacist Note   Spoke with [name]'s son, [name], about her discharge antibiotics. I counseled that [name] will be on linezolid  for another week and that this medication should be taken twice a day. [name] was familiar with this medication ans she had taken it in 2024 and tolerated it well.   I also let him know that we had scheduled a lab appointment for [name] on 10/29 at 10:00 AM to double check her CBC. [name] was aware and had received the MyChart message about this appointment.     Damien Quiet, PharmD, BCPS, BCIDP Infectious Diseases Clinical Pharmacist Phone: [phone] 11/03/2023 10:08 AM

## 2023-11-05 LAB — CULTURE, BLOOD (ROUTINE X 2)
Culture: NO GROWTH
Culture: NO GROWTH

## 2023-11-06 ENCOUNTER — Telehealth: Payer: Self-pay | Admitting: *Deleted

## 2023-11-06 NOTE — Transitions of Care (Post Inpatient/ED Visit) (Signed)
   11/06/2023  Name: [name] MRN: [id number] DOB: Aug 31, 1955  Today's TOC FU Call Status: Today's TOC FU Call Status:: Unsuccessful Call (1st Attempt) Unsuccessful Call (1st Attempt) Date: 11/06/23  Attempted to reach the patient regarding the most recent Inpatient/ED visit.  Follow Up Plan: Additional outreach attempts will be made to reach the patient to complete the Transitions of Care (Post Inpatient/ED visit) call.   Mliss Creed [facility], BSN RN Care Manager/ Transition of Care Miguel Barrera/ [facility] [phone]

## 2023-11-07 ENCOUNTER — Telehealth: Payer: Self-pay

## 2023-11-07 ENCOUNTER — Other Ambulatory Visit: Payer: Medicare (Managed Care) | Admitting: Licensed Clinical Social Worker

## 2023-11-07 ENCOUNTER — Telehealth: Payer: Self-pay | Admitting: *Deleted

## 2023-11-07 NOTE — Telephone Encounter (Signed)
 FYI  Copied from CRM [id number]. Topic: Clinical - Home Health Verbal Orders >> Nov 07, 2023  4:40 PM Tobias CROME wrote: Caller/Agency: [facility] Callback Number: [phone] Service Requested: New start of care date for Physical therapy for 11/13/2023, per family's request.  Frequency: n/a Any new concerns about the patient? No

## 2023-11-07 NOTE — Patient Instructions (Signed)
 Visit Information  Thank you for taking time to visit with me today. Please don't hesitate to contact me if I can be of assistance to you before our next scheduled appointment.  Your next care management appointment is by telephone on 11/21/2023 at 1:00 pm   Please call the care guide team at [phone] if you need to cancel, schedule, or reschedule an appointment.   Please call the Suicide and Crisis Lifeline: 988 call 1-800-273-TALK (toll free, 24 hour hotline) call 911 if you are experiencing a Mental Health or Behavioral Health Crisis or need someone to talk to.  Tobias [name], PhD [facility], [facility] Social Worker Direct Dial: [phone]  Fax: [phone]

## 2023-11-07 NOTE — Transitions of Care (Post Inpatient/ED Visit) (Signed)
 11/07/2023  Name: [name] MRN: [id number] DOB: [birth year]  Today's TOC FU Call Status: Today's TOC FU Call Status:: Successful TOC FU Call Completed TOC FU Call Complete Date: 11/07/23 Patient's Name and Date of Birth confirmed.  Transition Care Management Follow-up Telephone Call Date of Discharge: 11/03/23 Discharge Facility: [facility]) Type of Discharge: Inpatient Admission Primary Inpatient Discharge Diagnosis:: hepatic encephalopathy How have you been since you were released from the hospital?: Better (eating, drinking well, having regular bowel movements) Any questions or concerns?: No  Items Reviewed: Did you receive and understand the discharge instructions provided?: Yes Any new allergies since your discharge?: No Dietary orders reviewed?: Yes Type of Diet Ordered:: heart healthy Do you have support at home?: Yes People in Home [RPT]: child(ren), adult Name of Support/Comfort Primary Source: [name]- son-  lives with pt  Medications Reviewed Today: Medications Reviewed Today     Reviewed by Aura Mliss LABOR, RN (Registered Nurse) on 11/07/23 at 1450  Med List Status: <None>   Medication Order Taking? Sig Documenting Provider Last Dose Status Informant  acetaminophen  (TYLENOL ) 500 MG tablet [id number] Yes Take 500-1,000 mg by mouth every 6 (six) hours as needed for headache or fever (pain). [provider]  Active Child, Pharmacy Records  albuterol  (VENTOLIN  HFA) [phone] Base) MCG/ACT inhaler [id number] Yes Inhale 2 puffs into the lungs every 6 (six) hours as needed for shortness of breath or wheezing. [provider]  Active Child, Pharmacy Records  atorvastatin  (LIPITOR) 40 MG tablet [id number] Yes TAKE 1 TABLET(40 MG) BY MOUTH IN THE ERMALINDA [name], MD  Active Child, Pharmacy Records  copper  tablet [id number] Yes Take 2 mg by mouth in the morning and at bedtime. [provider]  Active Child, Pharmacy Records  cyanocobalamin   (VITAMIN B12) 1000 MCG tablet [id number] Yes Take 2,000 mcg by mouth every Monday, Wednesday, and Friday. [provider]  Active Child, Pharmacy Records  dicyclomine  (BENTYL ) 10 MG capsule [id number] Yes Take 1 capsule (10 mg total) by mouth 3 (three) times daily as needed for spasms (abdominal cramping). [name], MD  Active Child, Pharmacy Records  EPINEPHrine  0.3 mg/0.3 mL IJ SOAJ injection [id number] Yes Inject 0.3 mg into the muscle as needed for anaphylaxis. [name], MD  Active Child, Pharmacy Records  famotidine  (PEPCID ) 20 MG tablet [id number] Yes Take 20 mg by mouth daily as needed for heartburn or indigestion. [provider]  Active Child, Pharmacy Records  fluconazole  (DIFLUCAN ) 200 MG tablet [id number] Yes Take 1 tablet (200 mg total) by mouth daily for 5 days. Fayette Bodily, MD  Active   furosemide  (LASIX ) 40 MG tablet [id number] Yes Take 1 tablet (40 mg total) by mouth 2 (two) times daily. [name], MD  Active Child, Pharmacy Records  HYDROcodone -acetaminophen  [facility]) 10-325 MG tablet [id number] Yes Take 1 tablet by mouth every 8 (eight) hours as needed (for pain). [provider]  Active Child, Pharmacy Records  IRON -VITAMIN C  PO [id number] Yes Take 1 tablet by mouth daily. [provider]  Active Child, Pharmacy Records  lactulose  (CHRONULAC ) 10 GM/15ML solution [id number] Yes Take 45 mLs (30 g total) by mouth 4 (four) times daily. Barbarann Nest, MD  Active Child, Pharmacy Records           Med Note (COFFELL, JON HERO   Mon Oct 30, 2023  9:39 AM) Per discharge paperwork for admission 9/28-10/13, lactulose  was increased to 45mL QID - patient's son denies this and is giving 45mL TID.  levothyroxine  (SYNTHROID )  175 MCG tablet [id number] Yes TAKE 1 TABLET BY MOUTH DAILY A 6AM. RECHECK TSH WITH PCP IN 4 WEEKS [name], MD  Active Child, Pharmacy Records  linezolid  (ZYVOX ) 600 MG tablet [id number] Yes Take 1 tablet (600 mg  total) by mouth 2 (two) times daily for 7 days. Fayette Bodily, MD  Active   magnesium  gluconate (MAGONATE) 500 (27 Mg) MG TABS tablet [id number] Yes Take 1 tablet (500 mg total) by mouth in the morning and at bedtime. [name], MD  Active Child, Pharmacy Records  megestrol  (MEGACE ) 40 MG tablet [id number] Yes Take 1 tablet (40 mg total) by mouth 2 (two) times daily. Ajewole, [name], MD  Active Child, Pharmacy Records  methocarbamol  (ROBAXIN ) 500 MG tablet [id number] Yes Take 500 mg by mouth daily. [provider]  Active Child, Pharmacy Records  midodrine  (PROAMATINE ) 10 MG tablet [id number] Yes Take 10 mg by mouth 3 (three) times daily. [provider]  Active Child, Pharmacy Records           Med Note (COFFELL, JON CHRISTELLA Kitchens Oct 30, 2023  9:37 AM) Per discharge paperwork for admission 9/28-10/13, midodrine  was discontinued. Patient's son denies this and states the patient's blood pressure is too low to stop giving the medication.  Multiple Vitamins-Minerals (CENTRUM SILVER ULTRA WOMENS PO) [id number] Yes Take 1 tablet by mouth daily. [provider]  Active Child, Pharmacy Records  naloxone [facility]) nasal spray 4 mg/0.1 mL [id number]  Place 1 spray into the nose as needed (accidental overdose).  Patient not taking: Reported on 11/07/2023   [provider]  Active Child, Pharmacy Records           Med Note [facility] [place], [place] A   Wed Mar 08, 2023  2:39 PM)    nystatin  cream (MYCOSTATIN ) [id number] Yes Apply 1 Application topically 2 (two) times daily as needed for dry skin. Cheryle Page, MD  Active Child, Pharmacy Records  pantoprazole  (PROTONIX ) 40 MG tablet [id number] Yes Take 1 tablet (40 mg total) by mouth 2 (two) times daily before a meal. [name], MD  Active Child, Pharmacy Records  potassium chloride  (KLOR-CON ) 10 MEQ tablet [id number] Yes Take 2 tablets (20 mEq total) by mouth 2 (two) times daily. [name], MD  Active  Child, Pharmacy Records  rifaximin  (XIFAXAN ) 550 MG TABS tablet [id number] Yes Take 550 mg by mouth 2 (two) times daily. [provider]  Active Child, Pharmacy Records  rOPINIRole  (REQUIP ) 0.25 MG tablet [id number] Yes TAKE 1 TABLET(0.25 MG) BY MOUTH AT BEDTIME [name], MD  Active Child, Pharmacy Records  senna-docusate (SENOKOT-S) 8.6-50 MG tablet [id number] Yes Take 1 tablet by mouth 2 (two) times daily. [name], MD  Active Child, Pharmacy Records           Med Note (COFFELL, JON CHRISTELLA   Mon Oct 30, 2023  9:38 AM) Per discharge paperwork for admission 9/28-10/13, Senokot-S was discontinued. Patient's son denies this, patient still receiving BID.  spironolactone  (ALDACTONE ) 50 MG tablet [id number] Yes Take 50 mg by mouth daily. [provider]  Active Child, Pharmacy Records           Med Note (COFFELL, JON CHRISTELLA Kitchens Oct 30, 2023  9:41 AM) Per discharge paperwork for admission 9/28-10/13, spironolactone  reduced to 12.5mg  QD. Patient's son denies this, states she is supposed to be taking 50mg  QD.  triamcinolone  cream (KENALOG ) 0.1 % [id number] Yes Apply 1 Application topically 2 (two) times daily as needed (skin irritation).  [provider]  Active Child, Pharmacy Records  venlafaxine  XR (EFFEXOR -XR) 75 MG 24 hr capsule [id number] Yes Take 1 capsule (75 mg total) by mouth daily with breakfast. [name], MD  Active Child, Pharmacy Records  Vitamin D , Ergocalciferol , (DRISDOL) 50000 UNITS CAPS [phone] Yes Take 50,000 Units by mouth every Wednesday. [provider]  Active Child, Pharmacy Records  Med List Note Geri, Jon HERO, CPhT 10/30/23 9080): [name] (son) handles all medication            Home Care and Equipment/Supplies: Were Home Health Services Ordered?: Yes Name of Home Health Agency:: [facility] Has Agency set up a time to come to your home?: No First Home Health Visit Date:  (son states agency called and in process of setting up a  home visit) EMR reviewed for Home Health Orders: Orders present/patient has not received call (refer to CM for follow-up) Any new equipment or medical supplies ordered?: No  Functional Questionnaire: Do you need assistance with bathing/showering or dressing?: Yes (family assist) Do you need assistance with meal preparation?: Yes (family assist) Do you need assistance with eating?: No Do you have difficulty maintaining continence: No Do you need assistance with getting out of bed/getting out of a chair/moving?: Yes (walker) Do you have difficulty managing or taking your medications?: Yes (son provides oversight)  Follow up appointments reviewed: PCP Follow-up appointment confirmed?: Yes Date of PCP follow-up appointment?: 11/08/23 Follow-up Provider: Bene Aletha Driscilla Lionel Follow-up appointment confirmed?: NA Do you need transportation to your follow-up appointment?: No Do you understand care options if your condition(s) worsen?: Yes-patient verbalized understanding  SDOH Interventions Today    Flowsheet Row Most Recent Value  SDOH Interventions   Food Insecurity Interventions Intervention Not Indicated  Housing Interventions Intervention Not Indicated  Transportation Interventions Intervention Not Indicated  Utilities Interventions Intervention Not Indicated     Goals Addressed             This Visit's Progress    VBCI Transitions of Care (TOC) Care Plan       Problems:  Recent Hospitalization for treatment of hepatic encephalopathy/ cirrhosis Per patient and patient's son [name] DPR who reports patient is doing fine, reports compliance with lactulose  and pt having several bowel movements per day, blood pressure is being checked daily by son [facility] will be working with pt and has contacted patient's son  Goal:  Over the next 30 days, the patient will not experience hospital readmission  Interventions:  Transitions of Care: Doctor Visits  -  discussed the importance of doctor visits Reviewed Signs and symptoms of infection Evaluation of current treatment plan related to acute hepatic encephalopathy, cirrhosis, self-management and patient's adherence to plan as established by provider. Discussed plans with patient for ongoing care management follow up and provided patient with direct contact information for care management team Evaluation of current treatment plan related to cirrhosis  and patient's adherence to plan as established by provider Reviewed medications with patient and discussed importance of taking all medications as prescribed.  Reviewed compliance with lactulose  and importance of regular bowel movements to keep ammonia levels in check. Reviewed scheduled/upcoming provider appointments including primary care provider 11/08/23 Reviewed low sodium diet Reviewed importance of daily weight and keeping a log Reinforced importance of adequate protein in diet  Reviewed bleeding risk due to cirrhosis Encouraged to observe for any signs of infection, confusion and get medical treatment for infections asap Pain assessment completed  Patient Self Care Activities:  Attend all  scheduled provider appointments Attend church or other social activities Call pharmacy for medication refills 3-7 days in advance of running out of medications Call provider office for new concerns or questions  Notify RN Care Manager of TOC call rescheduling needs Participate in Transition of Care Program/Attend TOC scheduled calls Perform all self care activities independently  Take medications as prescribed   Work with the social worker to address care coordination needs and will continue to work with the clinical team to address health care and disease management related needs Do not skip lactulose , it is important to keep ammonia levels in check Provide adequate protein in diet  Get emergency assistance for bleeding- rectal blood, dark black stools  or vomiting of blood  Plan:  Telephone outreach scheduled for - 11/15/23 @ 1030 am        Mliss Creed [facility], BSN RN Care Manager/ Transition of Care [place]/ [facility] Population Health [phone]

## 2023-11-07 NOTE — Patient Outreach (Signed)
 Social Drivers of Health  Community Resource and Care Coordination Visit Note   11/07/2023  Name: [name] MRN: [id number] DOB:[birth year]  Situation: Referral received for [name] needs assessment and assistance related to [name] Application . I obtained verbal consent from Caregiver.  Visit completed with Son, [name] on the phone.   Background:  Needing to apply for Medicaid    Assessment: Patient will go to the Medicaid walk in clinic on Tomorrow 11/08/2023 to complete application, son [name] will go with patient   Goals Addressed             This Visit's Progress    BSW VBCI Social Work Care Plan       Problems:   Assistance with getting full medicaid benefits  CSW Clinical Goal(s):   Over the next 2 weeks the Caregiver Patient will will follow up with Medicaid walk in clinic as directed by Social Work.  Interventions:  SW will make referral to the walk in Medicaid clinic Patient and son will go to the walk in clinic  Patient Goals/Self-Care Activities:  Work with the Medicaid walk in clinic  Plan:   Telephone follow up appointment with care management team member scheduled for:  11/21/2023 at 1:00 pm        Recommendation:   Keep appt at [facility] walk in clinic  Follow Up Plan:   Telephone follow up appointment date/time:  11/21/2023 at 1:00 pm  Tobias [name], PhD [facility], [facility] Social Worker Direct Dial: [phone]  Fax: [phone]

## 2023-11-08 ENCOUNTER — Telehealth: Payer: Self-pay

## 2023-11-08 ENCOUNTER — Other Ambulatory Visit: Payer: Medicare (Managed Care)

## 2023-11-08 ENCOUNTER — Ambulatory Visit: Payer: Medicare (Managed Care) | Admitting: Family Medicine

## 2023-11-08 NOTE — Telephone Encounter (Signed)
 Thank you :)

## 2023-11-08 NOTE — Telephone Encounter (Signed)
 Vear' son, [name], called to cancel Caliya' lab appt. He stated she has an appt with her primary today who will complete labs. He stated Kimbria is a hard stick so they would prefer to do it in one appt. He stated Dr. Aletha has orders for CBC and CMP. Jamariyah and [name] can be reached at [phone].

## 2023-11-11 ENCOUNTER — Other Ambulatory Visit: Payer: Self-pay | Admitting: Family Medicine

## 2023-11-13 ENCOUNTER — Encounter: Payer: Self-pay | Admitting: Family Medicine

## 2023-11-13 ENCOUNTER — Ambulatory Visit: Payer: Medicare (Managed Care) | Admitting: Family Medicine

## 2023-11-13 ENCOUNTER — Telehealth: Payer: Self-pay | Admitting: Family Medicine

## 2023-11-13 VITALS — BP 118/70 | HR 69 | Temp 98.8°F | Ht 62.0 in | Wt 202.0 lb

## 2023-11-13 DIAGNOSIS — E7220 Disorder of urea cycle metabolism, unspecified: Secondary | ICD-10-CM

## 2023-11-13 DIAGNOSIS — E039 Hypothyroidism, unspecified: Secondary | ICD-10-CM

## 2023-11-13 DIAGNOSIS — R7881 Bacteremia: Secondary | ICD-10-CM | POA: Diagnosis not present

## 2023-11-13 DIAGNOSIS — D649 Anemia, unspecified: Secondary | ICD-10-CM | POA: Diagnosis not present

## 2023-11-13 DIAGNOSIS — R059 Cough, unspecified: Secondary | ICD-10-CM

## 2023-11-13 DIAGNOSIS — K7682 Hepatic encephalopathy: Secondary | ICD-10-CM | POA: Diagnosis not present

## 2023-11-13 DIAGNOSIS — Z23 Encounter for immunization: Secondary | ICD-10-CM

## 2023-11-13 DIAGNOSIS — B957 Other staphylococcus as the cause of diseases classified elsewhere: Secondary | ICD-10-CM | POA: Diagnosis not present

## 2023-11-13 MED ORDER — SPIRONOLACTONE 50 MG PO TABS: 50.0000 mg | ORAL_TABLET | Freq: Every day | ORAL | 0 refills | Status: DC

## 2023-11-13 MED ORDER — BENZONATATE 100 MG PO CAPS: 100.0000 mg | ORAL_CAPSULE | Freq: Three times a day (TID) | ORAL | 1 refills | Status: DC | PRN

## 2023-11-13 MED ORDER — ALBUTEROL SULFATE HFA 108 (90 BASE) MCG/ACT IN AERS: 2.0000 | INHALATION_SPRAY | Freq: Four times a day (QID) | RESPIRATORY_TRACT | 1 refills | Status: AC | PRN

## 2023-11-13 NOTE — Telephone Encounter (Signed)
 Copied from CRM [id number]. Topic: Clinical - Home Health Verbal Orders >> Nov 13, 2023  1:00 PM Wess RAMAN wrote: Caller/Agency: [place] Callback Number: [phone] Service Requested: New start of care date 11/15/23 Frequency: 1 week 1 (Evaluation) Any new concerns about the patient? Yes, Have attempted several time to see patient.

## 2023-11-13 NOTE — Telephone Encounter (Signed)
 Requested Prescriptions  Refused Prescriptions Disp Refills   spironolactone  (ALDACTONE ) 50 MG tablet [Pharmacy Med Name: SPIRONOLACTONE  50MG  TABLETS] 90 tablet     Sig: TAKE 1 TABLET(50 MG) BY MOUTH DAILY     Cardiovascular: Diuretics - Aldosterone Antagonist Failed - 11/13/2023  5:56 PM      Failed - Cr in normal range and within 180 days    Creat  Date Value Ref Range Status  09/04/2023 0.97 0.50 - 1.05 mg/dL Final   Creatinine, Ser  Date Value Ref Range Status  11/03/2023 1.41 (H) 0.44 - 1.00 mg/dL Final   Creatinine, Urine  Date Value Ref Range Status  05/23/2023 39 mg/dL Final    Comment:    Performed at [facility], [address], [place], [place] [address]         Passed - K in normal range and within 180 days    Potassium  Date Value Ref Range Status  11/03/2023 4.1 3.5 - 5.1 mmol/L Final         Passed - Na in normal range and within 180 days    Sodium  Date Value Ref Range Status  11/03/2023 139 135 - 145 mmol/L Final         Passed - eGFR is 30 or above and within 180 days    GFR calc Af Amer  Date Value Ref Range Status  01/19/2019 >60 >60 mL/min Final   GFR, Estimated  Date Value Ref Range Status  11/03/2023 41 (L) >60 mL/min Final    Comment:    (NOTE) Calculated using the CKD-EPI Creatinine Equation (2021)    eGFR  Date Value Ref Range Status  09/04/2023 65 > OR = 60 mL/min/1.89m2 Final         Passed - Last BP in normal range    BP Readings from Last 1 Encounters:  11/13/23 118/70         Passed - Valid encounter within last 6 months    Recent Outpatient Visits           Today Acute hepatic encephalopathy [facility])   [place] [facility] [name], MD   2 months ago Acute hepatic encephalopathy [facility])   [place] [facility] [name], MD   3 months ago Acute hepatic encephalopathy [facility])   [place] [facility] [name], MD   6 months ago  Edema of both lower legs   [place] [facility] [name], MD   8 months ago [place]   [place] [facility] [name], MD

## 2023-11-13 NOTE — Progress Notes (Signed)
 Patient Office Visit  Assessment & Plan:  Acute hepatic encephalopathy (HCC) -     CBC with Differential/Platelet -     Comprehensive metabolic panel with GFR -     Ammonia  Staphylococcus epidermidis bacteremia -     CBC with Differential/Platelet -     Comprehensive metabolic panel with GFR  Hyperammonemia -     Ammonia  Anemia, unspecified type -     CBC with Differential/Platelet  Needs flu shot -     Flu vaccine HIGH DOSE PF(Fluzone Trivalent)  Cough, unspecified type -     Albuterol  Sulfate HFA; Inhale 2 puffs into the lungs every 6 (six) hours as needed for wheezing or shortness of breath.  Dispense: 1 each; Refill: 1 -     Benzonatate ; Take 1 capsule (100 mg total) by mouth 3 (three) times daily as needed.  Dispense: 30 capsule; Refill: 1  Hypothyroidism, unspecified type -     TSH  Other orders -     Spironolactone ; Take 1 tablet (50 mg total) by mouth daily.  Dispense: 90 tablet; Refill: 0   Assessment and Plan    Cough Persistent cough post-hospitalization with coarse lung sounds, no wheezing, and normal oxygen saturation. Differential includes bronchitis, hospital-acquired infection, or cat scratch disease. - Prescribed Tessalon  Perles for cough management. - Prescribed albuterol  inhaler for symptomatic relief. - Recommended Mucinex  (no D, no Sudafed) for mucus management. - Encouraged adequate hydration.  Hepatic encephalopathy Confusion episodes noted. Lactulose  dosing discussed.  - Continue lactulose  2-3 times daily as tolerated. - Monitor ammonia levels as needed.  Hypothyroidism Thyroid  function normal in July. Reassessment needed due to symptoms and recent health changes. - Order thyroid  function tests.  Anemia Contributes to feeling cold.  General Health Maintenance Flu vaccination due. High-dose recommended due to age. - Administer high-dose flu vaccine.  Follow-up Missed follow-up with infectious disease. Blood work including CBC and  CMP planned. - Follow up with infectious disease for blood work including CBC and CMP. - Perform COVID-19 test at home and report results if positive.     Test results were reviewed and analyzed as part of the medical decision making of this visit.  RTC 3-4 mos or sooner if necessary     No follow-ups on file.   Subjective:    Patient ID: [name], female    DOB: 05-Mar-1955  Age: 68 y.o. MRN: [id number]  Chief Complaint  Patient presents with   Hospitalization Follow-up   Cough    Cough   Discussed the use of AI scribe software for clinical note transcription with the patient, who gave verbal consent to proceed.  History of Present Illness        [name] is a 68 year old female who presents with persistent cough and shoulder pain following a recent hospitalization for confusion due to hepatic encephalopathy and bacterial infection. Patient was admitted October 19 and discharged October 24 at [facility].  She has been experiencing a persistent cough since her recent hospitalization, which began after she was discharged on November 03, 2023. The cough is severe enough to cause significant shoulder pain. She has been taking Tessalon  Perles for the cough for a couple of days. No fever or chills, and her oxygen levels have been reported as normal.  She was hospitalized from October 19 to November 03, 2023, due to sudden confusion, initially associated with an elevated ammonia level in the sixties. During her hospital stay, she was found to  have a bacterial infection, and an initial culture grew staph. She received antibiotics, including vancomycin , during her hospital stay. She completed a seven-day course of antibiotics post-discharge.  She has a history of sinus and allergy issues and has been treated for a yeast infection in her throat. She also reports a history of cat scratches, which were considered as a potential source of infection during her hospital  stay.  Her current medications include spironolactone  50 mg once daily, and she takes lactulose  two to three times a day to manage her ammonia levels. She also takes potassium supplements, two in the morning.  She reports good appetite and has been eating three meals a day, although she sometimes needs encouragement to eat. Her weight is stable around 202 pounds, and she has been active around the house without significant shortness of breath.  No recent falls, but she attributes her shoulder pain to the coughing episodes. No recent changes in her medication regimen.  Physical Exam VITALS: SaO2- 99% MEASUREMENTS: Weight- 202. CHEST: Coarse lung sounds bilaterally, no wheeze. CARDIOVASCULAR: Heart murmur present.  Results LABS Ammonia: 60s (10/29/2023) Blood culture: Staphylococcus species (10/29/2023) Blood culture: No growth (10/31/2023) Respiratory panel: Negative for COVID-19, influenza, RSV Urine culture: No growth Thyroid  function: Normal (07/2023)  Assessment and Plan Cough Persistent cough post-hospitalization with coarse lung sounds, no wheezing, and normal oxygen saturation. Differential includes bronchitis, hospital-acquired infection, or cat scratch disease. - Prescribed Tessalon  Perles for cough management. - Prescribed albuterol  inhaler for symptomatic relief. - Recommended Mucinex  (no D, no Sudafed) for mucus management. - Encouraged adequate hydration.  Hepatic encephalopathy Confusion episodes noted. Lactulose  dosing discussed.  - Continue lactulose  2-3 times daily as tolerated. - Monitor ammonia levels as needed.  Hypothyroidism Thyroid  function normal in July. Reassessment needed due to symptoms and recent health changes. - Order thyroid  function tests.  Anemia Contributes to feeling cold.  General Health Maintenance Flu vaccination due. High-dose recommended due to age. - Administer high-dose flu vaccine.  Follow-up Missed follow-up with infectious  disease. Blood work including CBC and CMP planned. - Follow up with infectious disease for blood work including CBC and CMP. - Perform COVID-19 test at home and report results if positive.    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   Hyperammonemia 09/30/2022   Hypertension    Kidney stone    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   Obesity    Pulmonary emboli (HCC) 01/10/2009   Pulmonary embolism (HCC)    Short gut syndrome    Thyroid  disease    Past Surgical History:  Procedure Laterality Date   ABDOMINAL WALL MESH  REMOVAL     2016   CESAREAN SECTION WITH BILATERAL TUBAL LIGATION  04/30/1985   CHOLECYSTECTOMY  05/2007   Exploratory laparotomy, lysis of  adhesions, takedown of ileostomy with small bowel resection, open cholecystectomy and ileocolostomy   COLON SURGERY  01/2007   exploratory laparotomy with right colecotmy and end ileostomy   COLONOSCOPY  04/02/2019   Dr Gunnar Chris Daniels, MD [facility]   ESOPHAGOGASTRODUODENOSCOPY (EGD) WITH PROPOFOL  N/A 10/19/2022   Procedure: ESOPHAGOGASTRODUODENOSCOPY (EGD) WITH PROPOFOL ;  Surgeon: Saintclair Jasper, MD;  Location: [facility];  Service: Gastroenterology;  Laterality: N/A;   ESOPHAGOSCOPY  04/02/2019   EYE SURGERY Left 10/06/2020   Cataract extraciton  w/intraocular lens implant- Dr Caresse   HERNIA REPAIR  10/2007   ILEOSTOMY  2009   states bowel perfotation wih colonsocopy   ILEOSTOMY CLOSURE  2009   INCISIONAL HERNIA REPAIR  10/11/2018   IR KYPHO THORACIC WITH BONE BIOPSY  08/31/2022   IR KYPHO THORACIC WITH BONE BIOPSY  10/27/2022   IR RADIOLOGIST EVAL & MGMT  09/27/2022   KIDNEY STONE SURGERY     LIVER RESECTION     2009   PANNICULECTOMY     repair of abdominal wall hernia   WRIST SURGERY     tendonitis   Social History   Tobacco Use   Smoking status: Former    Current packs/day: 0.00    Types: Cigarettes    Quit date: 1987    Years since quitting: 38.8    Passive exposure: Past   Smokeless tobacco: Never  Vaping Use   Vaping status: Never Used  Substance Use Topics   Alcohol use: No   Drug use: No   Family History  Problem Relation Age of Onset   Uterine cancer Mother    Bone cancer Father    Heart disease Father    Hyperlipidemia Father    Dementia Father    Hypertension Father    Skin cancer Sister    Hypertension Sister    Diabetes Sister    Hyperlipidemia Brother    Hypertension Brother    Heart disease Brother    Allergies  Allergen Reactions   Ace Inhibitors Anaphylaxis and Swelling   Gadolinium Derivatives Anaphylaxis   Ivp Dye [Iodinated Contrast Media] Anaphylaxis   Wound Dressing Adhesive Dermatitis   Desitin [Zinc  Oxide] Rash and Other (See Comments)    Worsens rash   Butrans [Buprenorphine] Other (See Comments)    Discontinued due to liver function   Yellow Jacket Venom Swelling   Hibiclens  [Chlorhexidine  Gluconate] Rash and Other (See Comments)    Worsens rash   Penicillins Dermatitis and Rash   Vibramycin [Doxycycline] Rash   Zofran  [Ondansetron ] Rash    Review of Systems  Respiratory:  Positive for cough.       Objective:    BP 118/70   Pulse 69   Temp 98.8 F (37.1 C)   Ht 5' 2 (1.575 m)   Wt 202 lb (91.6 kg)   SpO2 99%   BMI 36.95 kg/m  BP Readings from  Last 3 Encounters:  11/13/23 118/70  11/07/23 128/64  11/03/23 116/60   Wt Readings from Last 3 Encounters:  11/13/23 202 lb (91.6 kg)  10/30/23 210 lb 5.1 oz (95.4 kg)  10/08/23 205 lb (93 kg)    Physical Exam Vitals and nursing note reviewed.  Constitutional:      General: She is not in acute distress.    Appearance: Normal appearance.     Comments: In wheelchair, comes in with son  HENT:     Head: Normocephalic.     Right Ear: Tympanic membrane and ear canal normal.  Left Ear: Tympanic membrane, ear canal and external ear normal.  Eyes:     General:        Left eye: Discharge present.    Extraocular Movements: Extraocular movements intact.     Pupils: Pupils are equal, round, and reactive to light.     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     Heart sounds: Normal heart sounds.  Pulmonary:     Effort: Pulmonary effort is normal.     Breath sounds: Normal breath sounds.  Abdominal:     Tenderness: There is no abdominal tenderness.  Musculoskeletal:     Right lower leg: No edema.     Left lower leg: No edema.  Neurological:     General: No focal deficit present.     Mental Status: She is alert and oriented to person, place, and time.  Psychiatric:        Mood and Affect: Mood normal.        Behavior: Behavior normal.        Thought Content: Thought content normal.        Judgment: Judgment normal.      No results found for any visits on 11/13/23.

## 2023-11-13 NOTE — Telephone Encounter (Signed)
 Verbal orders given

## 2023-11-14 ENCOUNTER — Telehealth: Payer: Self-pay

## 2023-11-14 ENCOUNTER — Ambulatory Visit: Payer: Self-pay | Admitting: Family Medicine

## 2023-11-14 LAB — AMMONIA: Ammonia: 31 umol/L (ref <=72)

## 2023-11-14 NOTE — Telephone Encounter (Signed)
 Copied from CRM [id number]. Topic: General - Other >> Nov 14, 2023 11:13 AM Larissa RAMAN wrote: Reason for CRM: [place] [place] states patient will not be admitted for services due to inability to schedule visits.  Callback # [phone]

## 2023-11-15 ENCOUNTER — Other Ambulatory Visit: Payer: Medicare (Managed Care) | Admitting: *Deleted

## 2023-11-15 LAB — CBC WITH DIFFERENTIAL/PLATELET
Absolute Lymphocytes: 1748 {cells}/uL (ref 850–3900)
Absolute Monocytes: 388 {cells}/uL (ref 200–950)
Basophils Absolute: 61 {cells}/uL (ref 0–200)
Basophils Relative: 0.9 %
Eosinophils Absolute: 442 {cells}/uL (ref 15–500)
Eosinophils Relative: 6.5 %
HCT: 36.7 % (ref 35.0–45.0)
Hemoglobin: 11.8 g/dL (ref 11.7–15.5)
MCH: 31.1 pg (ref 27.0–33.0)
MCHC: 32.2 g/dL (ref 32.0–36.0)
MCV: 96.8 fL (ref 80.0–100.0)
MPV: 11.1 fL (ref 7.5–12.5)
Monocytes Relative: 5.7 %
Neutro Abs: 4162 {cells}/uL (ref 1500–7800)
Neutrophils Relative %: 61.2 %
Platelets: 131 Thousand/uL — ABNORMAL LOW (ref 140–400)
RBC: 3.79 Million/uL — ABNORMAL LOW (ref 3.80–5.10)
RDW: 13.8 % (ref 11.0–15.0)
Total Lymphocyte: 25.7 %
WBC: 6.8 Thousand/uL (ref 3.8–10.8)

## 2023-11-15 LAB — COMPREHENSIVE METABOLIC PANEL WITH GFR
AG Ratio: 1.3 (calc) (ref 1.0–2.5)
ALT: 15 U/L (ref 6–29)
AST: 24 U/L (ref 10–35)
Albumin: 3.2 g/dL — ABNORMAL LOW (ref 3.6–5.1)
Alkaline phosphatase (APISO): 83 U/L (ref 37–153)
BUN/Creatinine Ratio: 19 (calc) (ref 6–22)
BUN: 29 mg/dL — ABNORMAL HIGH (ref 7–25)
CO2: 21 mmol/L (ref 20–32)
Calcium: 8.8 mg/dL (ref 8.6–10.4)
Chloride: 109 mmol/L (ref 98–110)
Creat: 1.52 mg/dL — ABNORMAL HIGH (ref 0.50–1.05)
Globulin: 2.4 g/dL (ref 1.9–3.7)
Glucose, Bld: 87 mg/dL (ref 65–99)
Potassium: 4.5 mmol/L (ref 3.5–5.3)
Sodium: 136 mmol/L (ref 135–146)
Total Bilirubin: 1.4 mg/dL — ABNORMAL HIGH (ref 0.2–1.2)
Total Protein: 5.6 g/dL — ABNORMAL LOW (ref 6.1–8.1)
eGFR: 38 mL/min/1.73m2 — ABNORMAL LOW (ref >=60)

## 2023-11-15 LAB — TSH: TSH: 1.66 m[IU]/L (ref 0.40–4.50)

## 2023-11-15 NOTE — Patient Outreach (Signed)
 Transition of Care week 2  Visit Note  11/15/2023  Name: [name] MRN: [id number]          DOB: [birth year]  Situation: Patient enrolled in [facility] 30-day program. Visit completed with patient by telephone.   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   Hyperammonemia 09/30/2022   Hypertension    Kidney stone    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   Obesity    Pulmonary emboli (HCC) 01/10/2009   Pulmonary embolism (HCC)    Short gut syndrome    Thyroid  disease     Assessment: Patient Reported Symptoms: Cognitive Cognitive Status: No symptoms reported, Able to follow simple commands, Alert and oriented to person, place, and time, Normal speech and language skills      Neurological Neurological Review of Symptoms: No symptoms reported    HEENT HEENT Symptoms Reported: No symptoms reported      Cardiovascular Cardiovascular Symptoms Reported: No symptoms reported Weight: 202 lb (91.6 kg)  Respiratory Respiratory Symptoms Reported: No symptoms reported    Endocrine Endocrine Symptoms Reported: No symptoms reported Is patient diabetic?: No    Gastrointestinal Gastrointestinal Symptoms Reported: No symptoms reported Additional Gastrointestinal Details: cirrhosis- pt is having regular bowel movements throughout the day, taking lactulose  as prescribed Gastrointestinal  Management Strategies: Adequate rest, Medication therapy Gastrointestinal Self-Management Outcome: 3 (uncertain) Gastrointestinal Comment: reinforced importance of taking lactulose  as prescribed to keep ammonia levels in check    Genitourinary Genitourinary Symptoms Reported: No symptoms reported    Integumentary Integumentary Symptoms Reported: No symptoms reported    Musculoskeletal Musculoskelatal Symptoms Reviewed: Limited mobility Additional Musculoskeletal Details: walker Musculoskeletal Management Strategies: Adequate rest, Routine screening Musculoskeletal Self-Management Outcome: 4 (good) Musculoskeletal Comment: reinforced safety precautions      Psychosocial    No symptoms reported       There were no vitals filed for this visit.  Medications Reviewed Today     Reviewed by Aura Mliss LABOR, RN (Registered Nurse) on 11/15/23 at 1050  Med List Status: <None>   Medication Order Taking? Sig Documenting Provider Last Dose Status Informant  acetaminophen  (TYLENOL ) 500 MG tablet [id number]  Take 500-1,000 mg by mouth every 6 (six) hours as needed for headache or fever (pain). [provider]  Active Child, Pharmacy Records  albuterol  (VENTOLIN  HFA) 108 [phone] Base) MCG/ACT inhaler [id number]  Inhale 2 puffs into the lungs every 6 (six) hours as needed for shortness of breath or wheezing. [provider]  Active Child, Pharmacy Records  albuterol  (VENTOLIN  HFA) 108 [phone] Base) MCG/ACT inhaler [id number]  Inhale 2 puffs into the lungs every 6 (six) hours as needed for wheezing or shortness of breath. [name], MD  Active   atorvastatin  (LIPITOR) 40 MG tablet [id number]  TAKE 1 TABLET(40 MG) BY MOUTH IN THE ERMALINDA [name], MD  Active Child, Pharmacy Records  benzonatate  (TESSALON  PERLES) 100 MG capsule [id number]  Take 1 capsule (100 mg total) by mouth 3 (three) times daily as needed. [name], MD  Active   copper  tablet [id number]  Take 2 mg by mouth in  the morning and at bedtime. [provider]  Active Child, Pharmacy Records  cyanocobalamin  (VITAMIN B12) 1000 MCG tablet [id number]  Take 2,000 mcg by mouth every Monday, Wednesday, and Friday. [provider]  Active Child, Pharmacy Records  dicyclomine  (BENTYL ) 10 MG capsule [id number]  Take 1 capsule (10 mg total) by mouth 3 (three) times daily as needed for spasms (abdominal cramping). [name], MD  Active Child, Pharmacy Records  EPINEPHrine  0.3 mg/0.3 mL IJ SOAJ injection [id number]  Inject 0.3 mg into the muscle as needed for anaphylaxis. [name], MD  Active Child, Pharmacy Records  famotidine  (PEPCID ) 20 MG tablet [id number]  Take 20 mg by mouth daily as needed for heartburn or indigestion. [provider]  Active Child, Pharmacy Records  furosemide  (LASIX ) 40 MG tablet [id number]  Take 1 tablet (40 mg total) by mouth 2 (two) times daily. [name], MD  Active Child, Pharmacy Records  HYDROcodone -acetaminophen  [facility]) 10-325 MG tablet [id number]  Take 1 tablet by mouth every 8 (eight) hours as needed (for pain). [provider]  Active Child, Pharmacy Records  IRON -VITAMIN C  PO [id number]  Take 1 tablet by mouth daily. [provider]  Active Child, Pharmacy Records  lactulose  (CHRONULAC ) 10 GM/15ML solution [id number]  Take 45 mLs (30 g total) by mouth 4 (four) times daily. Barbarann Nest, MD  Active Child, Pharmacy Records           Med Note (COFFELL, JON HERO   Mon Oct 30, 2023  9:39 AM) Per discharge paperwork for admission 9/28-10/13, lactulose  was increased to 45mL QID - patient's son denies this and is giving 45mL TID.  levothyroxine  (SYNTHROID ) 175 MCG tablet [id number]  TAKE 1 TABLET BY MOUTH DAILY A 6AM. RECHECK TSH WITH PCP IN 4 WEEKS [name], MD  Active Child, Pharmacy Records  magnesium  gluconate (MAGONATE) 500 (27 Mg) MG TABS tablet [id number]  Take 1 tablet (500 mg total) by mouth in the morning and at bedtime.  [name], MD  Active Child, Pharmacy Records  megestrol  (MEGACE ) 40 MG tablet [id number]  Take 1 tablet (40 mg total) by mouth 2 (two) times daily. Ajewole, [name], MD  Active Child, Pharmacy Records  methocarbamol  (ROBAXIN ) 500 MG tablet [id number]  Take 500 mg by mouth daily. [provider]  Active Child, Pharmacy Records  midodrine  (PROAMATINE ) 10 MG tablet [id number]  Take 10 mg by mouth 3 (three) times daily. [provider]  Active Child, Pharmacy Records           Med Note (COFFELL, JON HERO [place] Oct 30, 2023  9:37 AM) Per discharge paperwork for admission 9/28-10/13, midodrine  was discontinued. Patient's son denies this and states the patient's blood pressure is too low to stop giving the medication.  Multiple Vitamins-Minerals (CENTRUM SILVER ULTRA WOMENS PO) [id number]  Take 1 tablet by mouth daily. [provider]  Active Child, Pharmacy Records  naloxone [facility]) nasal spray 4 mg/0.1 mL [id number]  Place 1 spray into the nose as needed (accidental overdose).  Patient not taking: Reported on 11/07/2023   [provider]  Active Child, Pharmacy Records           Med Note [facility] EVERN DEVERE LABOR   Wed Mar 08, 2023  2:39 PM)    nystatin  cream (MYCOSTATIN ) [id number]  Apply  1 Application topically 2 (two) times daily as needed for dry skin. Cheryle Page, MD  Active Child, Pharmacy Records  pantoprazole  (PROTONIX ) 40 MG tablet [id number]  Take 1 tablet (40 mg total) by mouth 2 (two) times daily before a meal. [name], MD  Active Child, Pharmacy Records  potassium chloride  (KLOR-CON ) 10 MEQ tablet [id number]  Take 2 tablets (20 mEq total) by mouth 2 (two) times daily. [name], MD  Active Child, Pharmacy Records  rifaximin  (XIFAXAN ) 550 MG TABS tablet [id number]  Take 550 mg by mouth 2 (two) times daily. [provider]  Active Child, Pharmacy Records  rOPINIRole  (REQUIP ) 0.25 MG tablet [id number]  TAKE 1 TABLET(0.25 MG) BY  MOUTH AT BEDTIME [name], MD  Active Child, Pharmacy Records  senna-docusate (SENOKOT-S) 8.6-50 MG tablet [id number]  Take 1 tablet by mouth 2 (two) times daily. [name], MD  Active Child, Pharmacy Records           Med Note (COFFELL, JON HERO   Mon Oct 30, 2023  9:38 AM) Per discharge paperwork for admission 9/28-10/13, Senokot-S was discontinued. Patient's son denies this, patient still receiving BID.  spironolactone  (ALDACTONE ) 50 MG tablet [id number]  Take 1 tablet (50 mg total) by mouth daily. Aguiar, Rafaela, MD  Active   Tapentadol HCl 200 MG TB12 [id number]  Take 200 mg by mouth as needed. [provider]  Active   triamcinolone  cream (KENALOG ) 0.1 % [id number]  Apply 1 Application topically 2 (two) times daily as needed (skin irritation). [provider]  Active Child, Pharmacy Records  venlafaxine  XR (EFFEXOR -XR) 75 MG 24 hr capsule [id number]  Take 1 capsule (75 mg total) by mouth daily with breakfast. [name], MD  Active Child, Pharmacy Records  Vitamin D , Ergocalciferol , (DRISDOL) 50000 UNITS CAPS [phone]  Take 50,000 Units by mouth every Wednesday. [provider]  Active Child, Pharmacy Records  Med List Note Geri, Jon HERO, CPhT 10/30/23 9080): [name] (son) handles all medication            Goals Addressed             This Visit's Progress    VBCI Transitions of Care (TOC) Care Plan       Problems:  Recent Hospitalization for treatment of hepatic encephalopathy/ cirrhosis Per patient and patient's son [name] DPR who reports patient is doing fine, reports compliance with lactulose  and pt having several bowel movements per day, blood pressure is being checked daily by son [facility] will be working with pt and has contacted patient's son  Goal:  Over the next 30 days, the patient will not experience hospital readmission  Interventions:  Transitions of Care: Doctor Visits  - discussed the importance of doctor  visits Reviewed Signs and symptoms of infection Evaluation of current treatment plan related to acute hepatic encephalopathy, cirrhosis, self-management and patient's adherence to plan as established by provider. Discussed plans with patient for ongoing care management follow up and provided patient with direct contact information for care management team Evaluation of current treatment plan related to cirrhosis  and patient's adherence to plan as established by provider Reviewed medications with patient and discussed importance of taking all medications as prescribed.  Reviewed compliance with lactulose  and importance of regular bowel movements to keep ammonia levels in check. Reviewed scheduled/upcoming provider appointments  Reviewed low sodium diet Reinforced importance of daily weight and keeping a log Reviewed importance of adequate protein in diet  Reinforced bleeding risk due to cirrhosis Encouraged  to observe for any signs of infection, confusion and get medical treatment for infections asap Pain assessment completed  Patient Self Care Activities:  Attend all scheduled provider appointments Attend church or other social activities Call pharmacy for medication refills 3-7 days in advance of running out of medications Call provider office for new concerns or questions  Notify RN Care Manager of TOC call rescheduling needs Participate in Transition of Care Program/Attend TOC scheduled calls Perform all self care activities independently  Take medications as prescribed   Work with the social worker to address care coordination needs and will continue to work with the clinical team to address health care and disease management related needs Do not skip lactulose , it is important to keep ammonia levels in check Provide adequate protein in diet  Get emergency assistance for bleeding- rectal blood, dark black stools or vomiting of blood  Plan:  Telephone outreach scheduled for -  11/22/23 @ 115 pm         Recommendation:   PCP Follow-up  Follow Up Plan:   Telephone follow-up 11/22/23 @ 115 pm  Mliss Creed [facility], BSN RN Care Manager/ Transition of Care [place]/ [facility] Population [facility] [phone]

## 2023-11-15 NOTE — Patient Instructions (Signed)
 Visit Information  Thank you for taking time to visit with me today. Please don't hesitate to contact me if I can be of assistance to you before our next scheduled telephone appointment.  Our next appointment is by telephone on 11/22/23 @ 115 pm  Following is a copy of your care plan:   Goals Addressed             This Visit's Progress    VBCI Transitions of Care (TOC) Care Plan       Problems:  Recent Hospitalization for treatment of hepatic encephalopathy/ cirrhosis Per patient and patient's son [name] DPR who reports patient is doing fine, reports compliance with lactulose  and pt having several bowel movements per day, blood pressure is being checked daily by son [facility] will be working with pt and has contacted patient's son  Goal:  Over the next 30 days, the patient will not experience hospital readmission  Interventions:  Transitions of Care: Doctor Visits  - discussed the importance of doctor visits Reviewed Signs and symptoms of infection Evaluation of current treatment plan related to acute hepatic encephalopathy, cirrhosis, self-management and patient's adherence to plan as established by provider. Discussed plans with patient for ongoing care management follow up and provided patient with direct contact information for care management team Evaluation of current treatment plan related to cirrhosis  and patient's adherence to plan as established by provider Reviewed medications with patient and discussed importance of taking all medications as prescribed.  Reviewed compliance with lactulose  and importance of regular bowel movements to keep ammonia levels in check. Reviewed scheduled/upcoming provider appointments  Reviewed low sodium diet Reinforced importance of daily weight and keeping a log Reviewed importance of adequate protein in diet  Reinforced bleeding risk due to cirrhosis Encouraged to observe for any signs of infection, confusion and get medical  treatment for infections asap Pain assessment completed  Patient Self Care Activities:  Attend all scheduled provider appointments Attend church or other social activities Call pharmacy for medication refills 3-7 days in advance of running out of medications Call provider office for new concerns or questions  Notify RN Care Manager of TOC call rescheduling needs Participate in Transition of Care Program/Attend TOC scheduled calls Perform all self care activities independently  Take medications as prescribed   Work with the social worker to address care coordination needs and will continue to work with the clinical team to address health care and disease management related needs Do not skip lactulose , it is important to keep ammonia levels in check Provide adequate protein in diet  Get emergency assistance for bleeding- rectal blood, dark black stools or vomiting of blood  Plan:  Telephone outreach scheduled for - 11/22/23 @ 115 pm        Care plan and visit instructions communicated with the patient verbally today. Patient agrees to receive a copy in MyChart. Active MyChart status and patient understanding of how to access instructions and care plan via MyChart confirmed with patient.     Telephone follow up appointment with care management team member scheduled for: 11/22/23 @ 115 pm  Please call the care guide team at [phone] if you need to cancel or reschedule your appointment.   Please call the Suicide and Crisis Lifeline: 988 call the [place]  National Suicide Prevention Lifeline: (443) 350-6211 or TTY: (479)255-1484 TTY 416-600-6139) to talk to a trained counselor call 1-800-273-TALK (toll free, 24 hour hotline) go to [facility] [address] [place] [phone]) call  the [facility]: [phone] call 911 if you are experiencing a Mental Health or Behavioral Health Crisis or need someone to talk to.  Mliss Creed [facility], BSN RN Care Manager/ Transition of Care [place]/ [facility] [phone]

## 2023-11-20 ENCOUNTER — Telehealth: Payer: Self-pay

## 2023-11-21 ENCOUNTER — Encounter: Payer: Self-pay | Admitting: Licensed Clinical Social Worker

## 2023-11-21 ENCOUNTER — Other Ambulatory Visit: Payer: Medicare (Managed Care) | Admitting: Licensed Clinical Social Worker

## 2023-11-21 NOTE — Patient Instructions (Signed)
 [name] - I am sorry I was unable to reach you today for our scheduled appointment. I work with [name], MD and am calling to support your healthcare needs. Please contact me at [phone] at your earliest convenience. I look forward to speaking with you soon.   Thank you,  Tobias [name], PhD [facility], [facility] Social Worker Direct Dial: [phone]  Fax: [phone]

## 2023-11-22 ENCOUNTER — Telehealth: Payer: Medicare (Managed Care) | Admitting: *Deleted

## 2023-11-22 ENCOUNTER — Encounter: Payer: Self-pay | Admitting: *Deleted

## 2023-11-23 ENCOUNTER — Telehealth: Payer: Self-pay | Admitting: *Deleted

## 2023-11-23 NOTE — Patient Instructions (Signed)
 Visit Information  Thank you for taking time to visit with me today. Please don't hesitate to contact me if I can be of assistance to you before our next scheduled telephone appointment.  Following are the goals we discussed today:   Goals Addressed             This Visit's Progress    VBCI Transitions of Care (TOC) Care Plan       Problems:  Recent Hospitalization for treatment of hepatic encephalopathy/ cirrhosis Per patient and patient's son [name] DPR who reports patient is doing fine, reports compliance with lactulose  and pt having several bowel movements per day, blood pressure is being checked daily by son 11/23/23- no new concerns reported today  Goal:  Over the next 30 days, the patient will not experience hospital readmission  Interventions:  Transitions of Care: Doctor Visits  - discussed the importance of doctor visits Reviewed Signs and symptoms of infection Evaluation of current treatment plan related to acute hepatic encephalopathy, cirrhosis, self-management and patient's adherence to plan as established by provider. Discussed plans with patient for ongoing care management follow up and provided patient with direct contact information for care management team Evaluation of current treatment plan related to cirrhosis  and patient's adherence to plan as established by provider Reviewed medications with patient and discussed importance of taking all medications as prescribed.  Reviewed compliance with lactulose  and importance of regular bowel movements to keep ammonia levels in check. Reviewed scheduled/upcoming provider appointments  Reviewed low sodium diet Reinforced importance of daily weight and keeping a log Reinforced importance of adequate protein in diet  Reviewed bleeding risk due to cirrhosis Encouraged to observe for any signs of infection, confusion and get medical treatment for infections asap Pain assessment completed  Patient Self Care Activities:   Attend all scheduled provider appointments Attend church or other social activities Call pharmacy for medication refills 3-7 days in advance of running out of medications Call provider office for new concerns or questions  Notify RN Care Manager of TOC call rescheduling needs Participate in Transition of Care Program/Attend TOC scheduled calls Perform all self care activities independently  Take medications as prescribed   Work with the social worker to address care coordination needs and will continue to work with the clinical team to address health care and disease management related needs Do not skip lactulose , it is important to keep ammonia levels in check Provide adequate protein in diet  Get emergency assistance for bleeding- rectal blood, dark black stools or vomiting of blood, confusion (increase in ammonia level)  Plan:  Telephone outreach scheduled for - 11/29/23 @ 115 pm         Our next appointment is by telephone on 11/29/23 @ 115 pm  Please call the care guide team at [phone] if you need to cancel or reschedule your appointment.   If you are experiencing a Mental Health or Behavioral Health Crisis or need someone to talk to, please call the Suicide and Crisis Lifeline: 988 call the [place]  National Suicide Prevention Lifeline: 669-080-1701 or TTY: 787 668 6718 TTY 424-003-8082) to talk to a trained counselor call 1-800-273-TALK (toll free, 24 hour hotline) go to [facility] [address], [place] [phone]) call 911   Care plan and visit instructions communicated with the patient verbally today. Patient agrees to receive a copy in MyChart. Active MyChart status and patient understanding of how to access instructions and care plan via MyChart confirmed with patient.  Telephone follow up appointment with care management team member scheduled for: 11/29/23 @ 115 pm  Mliss Creed [facility], BSN RN Care Manager/ Transition  of Care [place]/ [facility] [phone]

## 2023-11-23 NOTE — Patient Outreach (Addendum)
 Transition of Care week 3  Visit Note  11/23/2023  Name: [name] MRN: [id number]          DOB: 08-01-1955  Situation: Patient enrolled in [facility] 30-day program. Visit completed with patient by telephone.   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   Hyperammonemia 09/30/2022   Hypertension    Kidney stone    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   Obesity    Pulmonary emboli (HCC) 01/10/2009   Pulmonary embolism (HCC)    Short gut syndrome    Thyroid  disease     Assessment: Patient Reported Symptoms: Cognitive Cognitive Status: No symptoms reported, Alert and oriented to person, place, and time, Able to follow simple commands, Normal speech and language skills      Neurological Neurological Review of Symptoms: No symptoms reported    HEENT HEENT Symptoms Reported: No symptoms reported      Cardiovascular Cardiovascular Symptoms Reported: No symptoms reported Weight: 202 lb (91.6 kg)  Respiratory Respiratory Symptoms Reported: No symptoms reported    Endocrine Endocrine Symptoms Reported: No symptoms reported Is patient diabetic?: No    Gastrointestinal Gastrointestinal Symptoms Reported: No symptoms reported Additional Gastrointestinal Details: cirrhosis- pt is having regular bowel movements, taking lactulose  as prescribed Gastrointestinal Management Strategies:  Adequate rest, Medication therapy Gastrointestinal Self-Management Outcome: 3 (uncertain) Gastrointestinal Comment: reviewed importance of taking lactulose  as prescribed to keep ammonia levels in check    Genitourinary Genitourinary Symptoms Reported: No symptoms reported    Integumentary Integumentary Symptoms Reported: No symptoms reported    Musculoskeletal Musculoskelatal Symptoms Reviewed: Limited mobility Additional Musculoskeletal Details: walker Musculoskeletal Management Strategies: Adequate rest, Routine screening Musculoskeletal Self-Management Outcome: 4 (good) Musculoskeletal Comment: reviewed safety precautions      Psychosocial Psychosocial Symptoms Reported: No symptoms reported         There were no vitals filed for this visit. Pain Scale: 0-10 Pain Score: 0-No pain  Medications Reviewed Today     Reviewed by Aura Mliss LABOR, RN (Registered Nurse) on 11/23/23 at 0902  Med List Status: <None>   Medication Order Taking? Sig Documenting Provider Last Dose Status Informant  acetaminophen  (TYLENOL ) 500 MG tablet [id number]  Take 500-1,000 mg by mouth every 6 (six) hours as needed for headache or fever (pain). [provider]  Active Child, Pharmacy Records  albuterol  (VENTOLIN  HFA) 108 (90 Base) MCG/ACT inhaler [id number]  Inhale 2 puffs into the lungs every 6 (six) hours as needed for shortness of breath or wheezing. [provider]  Active Child, Pharmacy Records  albuterol  (VENTOLIN  HFA) 108 [phone] Base) MCG/ACT inhaler [id number]  Inhale 2 puffs into the lungs every 6 (six) hours as needed for wheezing or shortness of breath. [name], MD  Active   atorvastatin  (LIPITOR) 40 MG tablet [id number]  TAKE 1 TABLET(40 MG) BY MOUTH IN THE MORNING [name], MD  Active Child, Pharmacy Records  benzonatate  (  TESSALON  PERLES) 100 MG capsule [id number]  Take 1 capsule (100 mg total) by mouth 3 (three) times daily as needed. [name], MD  Active    copper  tablet [id number]  Take 2 mg by mouth in the morning and at bedtime. [provider]  Active Child, Pharmacy Records  cyanocobalamin  (VITAMIN B12) 1000 MCG tablet [id number]  Take 2,000 mcg by mouth every Monday, Wednesday, and Friday. [provider]  Active Child, Pharmacy Records  dicyclomine  (BENTYL ) 10 MG capsule [id number]  Take 1 capsule (10 mg total) by mouth 3 (three) times daily as needed for spasms (abdominal cramping). [name], MD  Active Child, Pharmacy Records  EPINEPHrine  0.3 mg/0.3 mL IJ SOAJ injection [id number]  Inject 0.3 mg into the muscle as needed for anaphylaxis. [name], MD  Active Child, Pharmacy Records  famotidine  (PEPCID ) 20 MG tablet [id number]  Take 20 mg by mouth daily as needed for heartburn or indigestion. [provider]  Active Child, Pharmacy Records  furosemide  (LASIX ) 40 MG tablet [id number]  Take 1 tablet (40 mg total) by mouth 2 (two) times daily. [name], MD  Active Child, Pharmacy Records  HYDROcodone -acetaminophen  [facility]) 10-325 MG tablet [id number]  Take 1 tablet by mouth every 8 (eight) hours as needed (for pain). [provider]  Active Child, Pharmacy Records  IRON -VITAMIN C  PO [id number]  Take 1 tablet by mouth daily. [provider]  Active Child, Pharmacy Records  lactulose  (CHRONULAC ) 10 GM/15ML solution [id number]  Take 45 mLs (30 g total) by mouth 4 (four) times daily. Barbarann Nest, MD  Active Child, Pharmacy Records           Med Note (COFFELL, JON HERO   Mon Oct 30, 2023  9:39 AM) Per discharge paperwork for admission 9/28-10/13, lactulose  was increased to 45mL QID - patient's son denies this and is giving 45mL TID.  levothyroxine  (SYNTHROID ) 175 MCG tablet [id number]  TAKE 1 TABLET BY MOUTH DAILY A 6AM. RECHECK TSH WITH PCP IN 4 WEEKS [name], MD  Active Child, Pharmacy Records  magnesium  gluconate (MAGONATE) 500 (27 Mg) MG TABS tablet [id number]  Take 1 tablet (500 mg  total) by mouth in the morning and at bedtime. [name], MD  Active Child, Pharmacy Records  megestrol  (MEGACE ) 40 MG tablet [id number]  Take 1 tablet (40 mg total) by mouth 2 (two) times daily. Ajewole, [name], MD  Active Child, Pharmacy Records  methocarbamol  (ROBAXIN ) 500 MG tablet [id number]  Take 500 mg by mouth daily. [provider]  Active Child, Pharmacy Records  midodrine  (PROAMATINE ) 10 MG tablet [id number]  Take 10 mg by mouth 3 (three) times daily. [provider]  Active Child, Pharmacy Records           Med Note (COFFELL, JON HERO [place] Oct 30, 2023  9:37 AM) Per discharge paperwork for admission 9/28-10/13, midodrine  was discontinued. Patient's son denies this and states the patient's blood pressure is too low to stop giving the medication.  Multiple Vitamins-Minerals (CENTRUM SILVER ULTRA WOMENS PO) [id number]  Take 1 tablet by mouth daily. [provider]  Active Child, Pharmacy Records  naloxone [facility]) nasal spray 4 mg/0.1 mL [id number]  Place 1 spray into the nose as needed (accidental overdose).  Patient not taking: Reported on 11/07/2023   [provider]  Active Child, Pharmacy Records           Med Note [facility] [place], [place] A   Wed Mar 08, 2023  2:39 PM)  nystatin  cream (MYCOSTATIN ) [id number]  Apply 1 Application topically 2 (two) times daily as needed for dry skin. Cheryle Page, MD  Active Child, Pharmacy Records  pantoprazole  (PROTONIX ) 40 MG tablet [id number]  Take 1 tablet (40 mg total) by mouth 2 (two) times daily before a meal. [name], MD  Active Child, Pharmacy Records  potassium chloride  (KLOR-CON ) 10 MEQ tablet [id number]  Take 2 tablets (20 mEq total) by mouth 2 (two) times daily. [name], MD  Active Child, Pharmacy Records  rifaximin  (XIFAXAN ) 550 MG TABS tablet [id number]  Take 550 mg by mouth 2 (two) times daily. [provider]  Active Child, Pharmacy Records  rOPINIRole  (REQUIP ) 0.25 MG  tablet [id number]  TAKE 1 TABLET(0.25 MG) BY MOUTH AT BEDTIME [name], MD  Active Child, Pharmacy Records  senna-docusate (SENOKOT-S) 8.6-50 MG tablet [id number]  Take 1 tablet by mouth 2 (two) times daily. [name], MD  Active Child, Pharmacy Records           Med Note (COFFELL, JON HERO   Mon Oct 30, 2023  9:38 AM) Per discharge paperwork for admission 9/28-10/13, Senokot-S was discontinued. Patient's son denies this, patient still receiving BID.  spironolactone  (ALDACTONE ) 50 MG tablet [id number]  Take 1 tablet (50 mg total) by mouth daily. Aguiar, Rafaela, MD  Active   Tapentadol HCl 200 MG TB12 [id number]  Take 200 mg by mouth as needed. [provider]  Active   triamcinolone  cream (KENALOG ) 0.1 % [id number]  Apply 1 Application topically 2 (two) times daily as needed (skin irritation). [provider]  Active Child, Pharmacy Records  venlafaxine  XR (EFFEXOR -XR) 75 MG 24 hr capsule [id number]  Take 1 capsule (75 mg total) by mouth daily with breakfast. [name], MD  Active Child, Pharmacy Records  Vitamin D , Ergocalciferol , (DRISDOL) 50000 UNITS CAPS [id number]  Take 50,000 Units by mouth every Wednesday. [provider]  Active Child, Pharmacy Records  Med List Note Geri, Jon HERO, CPhT 10/30/23 9080): [name] (son) handles all medication             Goals Addressed             This Visit's Progress    VBCI Transitions of Care (TOC) Care Plan       Problems:  Recent Hospitalization for treatment of hepatic encephalopathy/ cirrhosis Per patient and patient's son [name] DPR who reports patient is doing fine, reports compliance with lactulose  and pt having several bowel movements per day, blood pressure is being checked daily by son 11/23/23- no new concerns reported today  Goal:  Over the next 30 days, the patient will not experience hospital readmission  Interventions:  Transitions of Care: Doctor Visits  - discussed the importance of  doctor visits Reviewed Signs and symptoms of infection Evaluation of current treatment plan related to acute hepatic encephalopathy, cirrhosis, self-management and patient's adherence to plan as established by provider. Discussed plans with patient for ongoing care management follow up and provided patient with direct contact information for care management team Evaluation of current treatment plan related to cirrhosis  and patient's adherence to plan as established by provider Reviewed medications with patient and discussed importance of taking all medications as prescribed.  Reviewed compliance with lactulose  and importance of regular bowel movements to keep ammonia levels in check. Reviewed scheduled/upcoming provider appointments  Reviewed low sodium diet Reinforced importance of daily weight and keeping a log Reinforced importance of adequate protein in diet  Reviewed bleeding risk due to cirrhosis Encouraged  to observe for any signs of infection, confusion and get medical treatment for infections asap Pain assessment completed  Patient Self Care Activities:  Attend all scheduled provider appointments Attend church or other social activities Call pharmacy for medication refills 3-7 days in advance of running out of medications Call provider office for new concerns or questions  Notify RN Care Manager of TOC call rescheduling needs Participate in Transition of Care Program/Attend TOC scheduled calls Perform all self care activities independently  Take medications as prescribed   Work with the social worker to address care coordination needs and will continue to work with the clinical team to address health care and disease management related needs Do not skip lactulose , it is important to keep ammonia levels in check Provide adequate protein in diet  Get emergency assistance for bleeding- rectal blood, dark black stools or vomiting of blood, confusion (increase in ammonia  level)  Plan:  Telephone outreach scheduled for - 11/29/23 @ 115 pm        Recommendation:   PCP Follow-up Specialty provider follow-up GI- 11/27/23  Follow Up Plan:   Telephone follow-up 11/29/23 @ 115 pm  Mliss Creed [facility], BSN RN Care Manager/ Transition of Care [place]/ [facility] [phone]

## 2023-11-27 DIAGNOSIS — K7682 Hepatic encephalopathy: Secondary | ICD-10-CM | POA: Diagnosis not present

## 2023-11-27 DIAGNOSIS — K7460 Unspecified cirrhosis of liver: Secondary | ICD-10-CM | POA: Diagnosis not present

## 2023-11-27 DIAGNOSIS — D509 Iron deficiency anemia, unspecified: Secondary | ICD-10-CM | POA: Diagnosis not present

## 2023-11-27 DIAGNOSIS — K7290 Hepatic failure, unspecified without coma: Secondary | ICD-10-CM | POA: Diagnosis not present

## 2023-11-29 ENCOUNTER — Other Ambulatory Visit: Payer: Medicare (Managed Care) | Admitting: *Deleted

## 2023-11-29 NOTE — Patient Outreach (Addendum)
 Transition of Care week 4  Visit Note  11/29/2023  Name: [name] MRN: [id number]          DOB: 12/25/1955  Situation: Patient enrolled in [facility] 30-day program. Visit completed with patient by telephone.   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   Hyperammonemia 09/30/2022   Hypertension    Kidney stone    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   Obesity    Pulmonary emboli (HCC) 01/10/2009   Pulmonary embolism (HCC)    Short gut syndrome    Thyroid  disease     Assessment: Patient Reported Symptoms: Cognitive Cognitive Status: No symptoms reported, Alert and oriented to person, place, and time, Able to follow simple commands, Normal speech and language skills      Neurological Neurological Review of Symptoms: No symptoms reported    HEENT HEENT Symptoms Reported: No symptoms reported      Cardiovascular Cardiovascular Symptoms Reported: No symptoms reported Weight: 203 lb (92.1 kg)  Respiratory Respiratory Symptoms Reported: No symptoms reported    Endocrine Endocrine Symptoms Reported: No symptoms reported Is patient diabetic?: No    Gastrointestinal Gastrointestinal Symptoms Reported: No symptoms reported Additional Gastrointestinal Details: cirrhosis- pt is having regular bowel movements, taking lactulose  as prescribed Gastrointestinal Management Strategies:  Activity, Medication therapy Gastrointestinal Self-Management Outcome: 3 (uncertain) Gastrointestinal Comment: reinforced importance of taking lactulose  as prescribed to keep ammonia levels in check    Genitourinary Genitourinary Symptoms Reported: No symptoms reported    Integumentary Integumentary Symptoms Reported: No symptoms reported    Musculoskeletal Musculoskelatal Symptoms Reviewed: Limited mobility Additional Musculoskeletal Details: walker Musculoskeletal Management Strategies: Adequate rest, Routine screening Musculoskeletal Self-Management Outcome: 4 (good) Musculoskeletal Comment: reinforced safety precautions      Psychosocial Psychosocial Symptoms Reported: No symptoms reported         Today's Vitals   11/29/23 1329  Weight: 203 lb (92.1 kg)   Pain Scale: 0-10 Pain Score: 0-No pain  Medications Reviewed Today     Reviewed by Aura Mliss LABOR, RN (Registered Nurse) on 11/29/23 at 1325  Med List Status: <None>   Medication Order Taking? Sig Documenting Provider Last Dose Status Informant  acetaminophen  (TYLENOL ) 500 MG tablet [id number]  Take 500-1,000 mg by mouth every 6 (six) hours as needed for headache or fever (pain). [provider]  Active Child, Pharmacy Records  albuterol  (VENTOLIN  HFA) 108 [phone] Base) MCG/ACT inhaler [id number]  Inhale 2 puffs into the lungs every 6 (six) hours as needed for shortness of breath or wheezing. [provider]  Active Child, Pharmacy Records  albuterol  (VENTOLIN  HFA) 108 [phone] Base) MCG/ACT inhaler [id number]  Inhale 2 puffs into the lungs every 6 (six) hours as needed for wheezing or shortness of breath. [name], MD  Active   atorvastatin  (LIPITOR) 40 MG tablet [id number]  TAKE 1 TABLET(40 MG) BY MOUTH IN THE MORNING [name], MD  Active  Child, Pharmacy Records  benzonatate  (TESSALON  PERLES) 100 MG capsule [id number]  Take 1 capsule (100 mg total) by mouth 3 (three) times daily as needed. [name], MD  Active   copper  tablet [id number]  Take 2 mg by mouth in the morning and at bedtime. [provider]  Active Child, Pharmacy Records  cyanocobalamin  (VITAMIN B12) 1000 MCG tablet [id number]  Take 2,000 mcg by mouth every Monday, Wednesday, and Friday. [provider]  Active Child, Pharmacy Records  dicyclomine  (BENTYL ) 10 MG capsule [id number]  Take 1 capsule (10 mg total) by mouth 3 (three) times daily as needed for spasms (abdominal cramping). [name], MD  Active Child, Pharmacy Records  EPINEPHrine  0.3 mg/0.3 mL IJ SOAJ injection [id number]  Inject 0.3 mg into the muscle as needed for anaphylaxis. [name], MD  Active Child, Pharmacy Records  famotidine  (PEPCID ) 20 MG tablet [id number]  Take 20 mg by mouth daily as needed for heartburn or indigestion. [provider]  Active Child, Pharmacy Records  furosemide  (LASIX ) 40 MG tablet [id number]  Take 1 tablet (40 mg total) by mouth 2 (two) times daily. [name], MD  Active Child, Pharmacy Records  HYDROcodone -acetaminophen  [facility]) 10-325 MG tablet [id number]  Take 1 tablet by mouth every 8 (eight) hours as needed (for pain). [provider]  Active Child, Pharmacy Records  IRON -VITAMIN C  PO [id number]  Take 1 tablet by mouth daily. [provider]  Active Child, Pharmacy Records  lactulose  (CHRONULAC ) 10 GM/15ML solution [id number]  Take 45 mLs (30 g total) by mouth 4 (four) times daily. Barbarann Nest, MD  Active Child, Pharmacy Records           Med Note (COFFELL, JON HERO   Mon Oct 30, 2023  9:39 AM) Per discharge paperwork for admission 9/28-10/13, lactulose  was increased to 45mL QID - patient's son denies this and is giving 45mL TID.  levothyroxine  (SYNTHROID ) 175 MCG tablet [id number]  TAKE 1 TABLET BY MOUTH DAILY A 6AM. RECHECK TSH WITH PCP IN 4 WEEKS [name], MD  Active Child, Pharmacy Records  magnesium  gluconate (MAGONATE) 500 (27 Mg) MG TABS tablet [id number]  Take  1 tablet (500 mg total) by mouth in the morning and at bedtime. [name], MD  Active Child, Pharmacy Records  megestrol  (MEGACE ) 40 MG tablet [id number]  Take 1 tablet (40 mg total) by mouth 2 (two) times daily. Ajewole, [name], MD  Active Child, Pharmacy Records  methocarbamol  (ROBAXIN ) 500 MG tablet [id number]  Take 500 mg by mouth daily. [provider]  Active Child, Pharmacy Records  midodrine  (PROAMATINE ) 10 MG tablet [id number]  Take 10 mg by mouth 3 (three) times daily. [provider]  Active Child, Pharmacy Records           Med Note (COFFELL, JON HERO [place] Oct 30, 2023  9:37 AM) Per discharge paperwork for admission 9/28-10/13, midodrine  was discontinued. Patient's son denies this and states the patient's blood pressure is too low to stop giving the medication.  Multiple Vitamins-Minerals (CENTRUM SILVER ULTRA WOMENS PO) [id number]  Take 1 tablet by mouth daily. [provider]  Active Child, Pharmacy Records  naloxone [facility]) nasal spray 4 mg/0.1 mL [id number]  Place 1 spray into the nose as needed (accidental overdose).  Patient not taking: Reported on 11/07/2023   [provider]  Active Child, Pharmacy Records           Med Note [facility] [place], [place] A   Wed Mar 08, 2023  2:39 PM)    nystatin  cream (MYCOSTATIN ) [id number]  Apply 1 Application topically 2 (two) times daily as needed for dry skin. Cheryle Page, MD  Active Child, Pharmacy Records  pantoprazole  (PROTONIX ) 40 MG tablet [id number]  Take 1 tablet (40 mg total) by mouth 2 (two) times daily before a meal. [name], MD  Active Child, Pharmacy Records  potassium chloride  (KLOR-CON ) 10 MEQ tablet [id number]  Take 2 tablets (20 mEq total) by mouth 2 (two) times daily. [name], MD  Active Child, Pharmacy Records  rifaximin  (XIFAXAN ) 550 MG TABS tablet [id number]  Take 550 mg by mouth 2 (two) times daily. [provider]  Active Child, Pharmacy Records  rOPINIRole   (REQUIP ) 0.25 MG tablet [id number]  TAKE 1 TABLET(0.25 MG) BY MOUTH AT BEDTIME [name], MD  Active Child, Pharmacy Records  senna-docusate (SENOKOT-S) 8.6-50 MG tablet [id number]  Take 1 tablet by mouth 2 (two) times daily. [name], MD  Active Child, Pharmacy Records           Med Note (COFFELL, JON HERO   Mon Oct 30, 2023  9:38 AM) Per discharge paperwork for admission 9/28-10/13, Senokot-S was discontinued. Patient's son denies this, patient still receiving BID.  spironolactone  (ALDACTONE ) 50 MG tablet [id number]  Take 1 tablet (50 mg total) by mouth daily. Aguiar, Rafaela, MD  Active   Tapentadol HCl 200 MG TB12 [id number]  Take 200 mg by mouth as needed. [provider]  Active   triamcinolone  cream (KENALOG ) 0.1 % [id number]  Apply 1 Application topically 2 (two) times daily as needed (skin irritation). [provider]  Active Child, Pharmacy Records  venlafaxine  XR (EFFEXOR -XR) 75 MG 24 hr capsule [id number]  Take 1 capsule (75 mg total) by mouth daily with breakfast. [name], MD  Active Child, Pharmacy Records  Vitamin D , Ergocalciferol , (DRISDOL ) 50000 UNITS CAPS [id number]  Take 50,000 Units by mouth every Wednesday. [provider]  Active Child, Pharmacy Records  Med List Note Geri, Jon HERO, CPhT 10/30/23 9080): [name] (son) handles all medication            Goals Addressed             This Visit's Progress    VBCI Transitions of Care (TOC) Care Plan       Problems:  Recent Hospitalization for treatment of hepatic encephalopathy/ cirrhosis Per patient and patient's son [name] DPR who reports patient is doing fine, reports compliance with lactulose  and pt having several bowel movements per day, blood pressure is being checked daily by son 11/29/23- no new concerns reported today, states  everything going well    Goal:  Over the next 30 days, the patient will not experience hospital readmission  Interventions:  Transitions of  Care: Doctor Visits  - discussed the importance of doctor visits Reviewed Signs and symptoms of infection Evaluation of current treatment plan related to acute hepatic encephalopathy, cirrhosis, self-management and patient's adherence to plan as established by provider. Discussed plans with patient for ongoing care management follow up and provided patient with direct contact information for care management team Evaluation of current treatment plan related to cirrhosis  and patient's adherence to plan as established by provider Reviewed medications with patient and discussed importance of taking all medications as prescribed.  Reviewed compliance with lactulose  and importance of regular bowel movements to keep ammonia levels in check. Reviewed scheduled/upcoming provider appointments  Reviewed low sodium diet Reinforced importance of daily weight and keeping a log Reviewed importance of adequate  protein in diet  Reinforced bleeding risk due to cirrhosis Encouraged to observe for any signs of infection, confusion and get medical treatment for infections asap Pain assessment completed Reviewed plan of care with pt, son including next outreach the week of 12/11/23 due to Thanksgiving holiday, pt is stable and agrees to phone call on 12/13/23  Patient Self Care Activities:  Attend all scheduled provider appointments Attend church or other social activities Call pharmacy for medication refills 3-7 days in advance of running out of medications Call provider office for new concerns or questions  Notify RN Care Manager of TOC call rescheduling needs Participate in Transition of Care Program/Attend TOC scheduled calls Perform all self care activities independently  Take medications as prescribed   Work with the social worker to address care coordination needs and will continue to work with the clinical team to address health care and disease management related needs Do not skip lactulose , it is  important to keep ammonia levels in check Provide adequate protein in diet  Get emergency assistance for bleeding- rectal blood, dark black stools or vomiting of blood, confusion (increase in ammonia level)  Plan:  Telephone outreach scheduled for - 12/13/23 @ 115 pm         Recommendation:   PCP Follow-up  Follow Up Plan:   Telephone follow-up 12/13/23 @ 115 pm  Mliss Creed [facility], BSN RN Care Manager/ Transition of Care [place]/ [facility] Population Health [phone]

## 2023-11-29 NOTE — Patient Instructions (Signed)
 Visit Information  Thank you for taking time to visit with me today. Please don't hesitate to contact me if I can be of assistance to you before our next scheduled telephone appointment.  Our next appointment is by telephone on 12/13/23 @ 115 pm  Following is a copy of your care plan:   Goals Addressed             This Visit's Progress    VBCI Transitions of Care (TOC) Care Plan       Problems:  Recent Hospitalization for treatment of hepatic encephalopathy/ cirrhosis Per patient and patient's son [name] DPR who reports patient is doing fine, reports compliance with lactulose  and pt having several bowel movements per day, blood pressure is being checked daily by son 11/29/23- no new concerns reported today, states  everything going well    Goal:  Over the next 30 days, the patient will not experience hospital readmission  Interventions:  Transitions of Care: Doctor Visits  - discussed the importance of doctor visits Reviewed Signs and symptoms of infection Evaluation of current treatment plan related to acute hepatic encephalopathy, cirrhosis, self-management and patient's adherence to plan as established by provider. Discussed plans with patient for ongoing care management follow up and provided patient with direct contact information for care management team Evaluation of current treatment plan related to cirrhosis  and patient's adherence to plan as established by provider Reviewed medications with patient and discussed importance of taking all medications as prescribed.  Reviewed compliance with lactulose  and importance of regular bowel movements to keep ammonia levels in check. Reviewed scheduled/upcoming provider appointments  Reviewed low sodium diet Reinforced importance of daily weight and keeping a log Reviewed importance of adequate protein in diet  Reinforced bleeding risk due to cirrhosis Encouraged to observe for any signs of infection, confusion and get medical  treatment for infections asap Pain assessment completed Reviewed plan of care with pt, son including next outreach the week of 12/11/23 due to Thanksgiving holiday, pt is stable and agrees to phone call on 12/13/23  Patient Self Care Activities:  Attend all scheduled provider appointments Attend church or other social activities Call pharmacy for medication refills 3-7 days in advance of running out of medications Call provider office for new concerns or questions  Notify RN Care Manager of TOC call rescheduling needs Participate in Transition of Care Program/Attend TOC scheduled calls Perform all self care activities independently  Take medications as prescribed   Work with the social worker to address care coordination needs and will continue to work with the clinical team to address health care and disease management related needs Do not skip lactulose , it is important to keep ammonia levels in check Provide adequate protein in diet  Get emergency assistance for bleeding- rectal blood, dark black stools or vomiting of blood, confusion (increase in ammonia level)  Plan:  Telephone outreach scheduled for - 12/13/23 @ 115 pm        Care plan and visit instructions communicated with the patient verbally today. Patient agrees to receive a copy in MyChart. Active MyChart status and patient understanding of how to access instructions and care plan via MyChart confirmed with patient.     Telephone follow up appointment with care management team member scheduled for:  12/13/23 @ 115 pm  Please call the care guide team at [phone] if you need to cancel or reschedule your appointment.   Please call the Suicide and Crisis Lifeline: 988 call the [place]  National Suicide Prevention  Lifeline: 806-393-9155 or TTY: 319 442 5381 TTY (478)305-7404) to talk to a trained counselor call 1-800-273-TALK (toll free, 24 hour hotline) go to [facility] [address]  [place] [phone]) call 911 if you are experiencing a Mental Health or Behavioral Health Crisis or need someone to talk to.  Mliss Creed [facility], BSN RN Care Manager/ Transition of Care [place]/ [facility] [phone]

## 2023-11-30 ENCOUNTER — Other Ambulatory Visit: Payer: Self-pay

## 2023-11-30 DIAGNOSIS — K5900 Constipation, unspecified: Secondary | ICD-10-CM

## 2023-11-30 MED ORDER — SENNOSIDES-DOCUSATE SODIUM 8.6-50 MG PO TABS: 1.0000 | ORAL_TABLET | Freq: Two times a day (BID) | ORAL | 1 refills | Status: DC

## 2023-12-05 ENCOUNTER — Other Ambulatory Visit: Payer: Medicare (Managed Care) | Admitting: Licensed Clinical Social Worker

## 2023-12-05 NOTE — Patient Outreach (Signed)
 Social Drivers of Health  Community Resource and Care Coordination Visit Note   12/05/2023  Name: [name] MRN: [id number] DOB:[birth year]  Situation: Referral received for [name] needs assessment and assistance related to [name] application . I obtained verbal consent from patient and Caregiver.  Visit completed with Patient on the phone.   Background:      Assessment:   Goals Addressed             This Visit's Progress    BSW VBCI Social Work Care Plan       Problems:   Assistance with getting full medicaid benefits  CSW Clinical Goal(s):   Over the next 2 weeks the Caregiver Patient will will follow up with Medicaid walk in clinic as directed by Social Work.  Interventions:  SW will make referral to the walk in Medicaid clinic Patient and son will go to the walk in clinic  Patient Goals/Self-Care Activities:  Work with the Medicaid walk in clinic  Plan:   Telephone follow up appointment with care management team member scheduled for:  12/26/2023 at 11:00 am        Recommendation:   Patients son stated that he plans to go to the walk in clinic after Thanksgiving and wants SW to call back in a couple of weeks   Follow Up Plan:   Telephone follow up appointment date/time:  12/26/2023 at 11:00 am  [name] [name] HEDWIG, PhD [facility], [name] Social Worker Direct Dial: [phone]  Fax: [phone]

## 2023-12-05 NOTE — Patient Instructions (Signed)
 Visit Information  Thank you for taking time to visit with me today. Please don't hesitate to contact me if I can be of assistance to you before our next scheduled appointment.  Your next care management appointment is by telephone on 12/26/2023 at 11:00am   Please call the care guide team at [phone] if you need to cancel, schedule, or reschedule an appointment.   Please call the Suicide and Crisis Lifeline: 988 go to [facility] Urgent [facility] [address], [place] [phone]) call 911 if you are experiencing a Mental Health or Behavioral Health Crisis or need someone to talk to.  Tobias [name], PhD [facility], [facility] Social Worker Direct Dial: [phone]  Fax: [phone]

## 2023-12-11 DIAGNOSIS — K7290 Hepatic failure, unspecified without coma: Secondary | ICD-10-CM | POA: Diagnosis not present

## 2023-12-11 DIAGNOSIS — K7460 Unspecified cirrhosis of liver: Secondary | ICD-10-CM | POA: Diagnosis not present

## 2023-12-11 DIAGNOSIS — K7682 Hepatic encephalopathy: Secondary | ICD-10-CM | POA: Diagnosis not present

## 2023-12-11 DIAGNOSIS — Z1231 Encounter for screening mammogram for malignant neoplasm of breast: Secondary | ICD-10-CM | POA: Diagnosis not present

## 2023-12-11 LAB — HM MAMMOGRAPHY

## 2023-12-13 ENCOUNTER — Other Ambulatory Visit: Payer: Self-pay

## 2023-12-13 ENCOUNTER — Other Ambulatory Visit: Payer: Medicare (Managed Care) | Admitting: *Deleted

## 2023-12-13 VITALS — Wt 204.0 lb

## 2023-12-13 DIAGNOSIS — G4709 Other insomnia: Secondary | ICD-10-CM

## 2023-12-13 DIAGNOSIS — K7460 Unspecified cirrhosis of liver: Secondary | ICD-10-CM

## 2023-12-13 DIAGNOSIS — F32A Depression, unspecified: Secondary | ICD-10-CM

## 2023-12-13 DIAGNOSIS — Z780 Asymptomatic menopausal state: Secondary | ICD-10-CM

## 2023-12-13 DIAGNOSIS — R5381 Other malaise: Secondary | ICD-10-CM

## 2023-12-13 MED ORDER — VENLAFAXINE HCL ER 75 MG PO CP24: 75.0000 mg | ORAL_CAPSULE | Freq: Every day | ORAL | 0 refills | Status: AC

## 2023-12-13 NOTE — Patient Outreach (Signed)
 Transition of Care week 5  Visit Note  12/13/2023  Name: [name] MRN: [id number]          DOB: Mar 04, 1955  Situation: Patient enrolled in [facility] 30-day program. Visit completed with patient by telephone.   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   Hyperammonemia 09/30/2022   Hypertension    Kidney stone    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   Obesity    Pulmonary emboli (HCC) 01/10/2009   Pulmonary embolism (HCC)    Short gut syndrome    Thyroid  disease     Assessment: Patient Reported Symptoms: Cognitive Cognitive Status: No symptoms reported, Alert and oriented to person, place, and time, Able to follow simple commands, Normal speech and language skills      Neurological Neurological Review of Symptoms: No symptoms reported    HEENT HEENT Symptoms Reported: No symptoms reported      Cardiovascular Cardiovascular Symptoms Reported: No symptoms reported Does patient have uncontrolled Hypertension?: No Weight: 204 lb (92.5 kg)  Respiratory Respiratory Symptoms Reported: No symptoms reported    Endocrine Endocrine Symptoms Reported: No symptoms reported Is patient diabetic?: No    Gastrointestinal Gastrointestinal Symptoms Reported: No symptoms reported Additional Gastrointestinal Details: cirrhosis- pt is having regular bowel movements,  taking lactulose  as  prescribed Gastrointestinal Management Strategies: Adequate rest, Medication therapy Gastrointestinal Self-Management Outcome: 3 (uncertain) Gastrointestinal Comment: reviewed importance of taking lactulose  as prescribed to keep ammonia levels in check    Genitourinary Genitourinary Symptoms Reported: No symptoms reported    Integumentary Integumentary Symptoms Reported: No symptoms reported    Musculoskeletal Musculoskelatal Symptoms Reviewed: Limited mobility Additional Musculoskeletal Details: uses walker Musculoskeletal Management Strategies: Adequate rest, Routine screening Musculoskeletal Self-Management Outcome: 4 (good) Musculoskeletal Comment: reviewed safety precautions      Psychosocial Psychosocial Symptoms Reported: No symptoms reported         Today's Vitals   12/13/23 1326  Weight: 204 lb (92.5 kg)   Pain Scale: 0-10 Pain Score: 0-No pain  Medications Reviewed Today     Reviewed by Aura Mliss LABOR, RN (Registered Nurse) on 12/13/23 at 1323  Med List Status: <None>   Medication Order Taking? Sig Documenting Provider Last Dose Status Informant  acetaminophen  (TYLENOL ) 500 MG tablet [id number]  Take 500-1,000 mg by mouth every 6 (six) hours as needed for headache or fever (pain). [provider]  Active Child, Pharmacy Records  albuterol  (VENTOLIN  HFA) 108 [phone] Base) MCG/ACT inhaler [id number]  Inhale 2 puffs into the lungs every 6 (six) hours as needed for shortness of breath or wheezing. [provider]  Active Child, Pharmacy Records  albuterol  (VENTOLIN  HFA) 108 [phone] Base) MCG/ACT inhaler [id number]  Inhale 2 puffs into the lungs every 6 (six) hours as needed for wheezing or shortness of breath. [name], MD  Active   atorvastatin  (LIPITOR) 40 MG tablet [id number]  TAKE 1 TABLET(40 MG) BY  MOUTH IN THE ERMALINDA [name], MD  Active Child, Pharmacy Records  benzonatate  (TESSALON  PERLES) 100 MG capsule [id number]  Take 1 capsule (100 mg total)  by mouth 3 (three) times daily as needed. [name], MD  Active   copper  tablet [id number]  Take 2 mg by mouth in the morning and at bedtime. [provider]  Active Child, Pharmacy Records  cyanocobalamin  (VITAMIN B12) 1000 MCG tablet [id number]  Take 2,000 mcg by mouth every Monday, Wednesday, and Friday. [provider]  Active Child, Pharmacy Records  dicyclomine  (BENTYL ) 10 MG capsule [id number]  Take 1 capsule (10 mg total) by mouth 3 (three) times daily as needed for spasms (abdominal cramping). [name], MD  Active Child, Pharmacy Records  EPINEPHrine  0.3 mg/0.3 mL IJ SOAJ injection [id number]  Inject 0.3 mg into the muscle as needed for anaphylaxis. [name], MD  Active Child, Pharmacy Records  famotidine  (PEPCID ) 20 MG tablet [id number]  Take 20 mg by mouth daily as needed for heartburn or indigestion. [provider]  Active Child, Pharmacy Records  furosemide  (LASIX ) 40 MG tablet [id number]  Take 1 tablet (40 mg total) by mouth 2 (two) times daily. [name], MD  Active Child, Pharmacy Records  HYDROcodone -acetaminophen  [facility]) 10-325 MG tablet [id number]  Take 1 tablet by mouth every 8 (eight) hours as needed (for pain). [provider]  Active Child, Pharmacy Records  IRON -VITAMIN C  PO [id number]  Take 1 tablet by mouth daily. [provider]  Active Child, Pharmacy Records  lactulose  (CHRONULAC ) 10 GM/15ML solution [id number]  Take 45 mLs (30 g total) by mouth 4 (four) times daily. Barbarann Nest, MD  Active Child, Pharmacy Records           Med Note (COFFELL, JON HERO   Mon Oct 30, 2023  9:39 AM) Per discharge paperwork for admission 9/28-10/13, lactulose  was increased to 45mL QID - patient's son denies this and is giving 45mL TID.  levothyroxine  (SYNTHROID ) 175 MCG tablet [id number]  TAKE 1 TABLET BY MOUTH DAILY A 6AM. RECHECK TSH WITH PCP IN 4 WEEKS [name], MD  Active Child, Pharmacy Records  magnesium  gluconate  (MAGONATE) 500 (27 Mg) MG TABS tablet [id number]  Take 1 tablet (500 mg total) by mouth in the morning and at bedtime. [name], MD  Active Child, Pharmacy Records  megestrol  (MEGACE ) 40 MG tablet [id number]  Take 1 tablet (40 mg total) by mouth 2 (two) times daily. Ajewole, [name], MD  Active Child, Pharmacy Records  methocarbamol  (ROBAXIN ) 500 MG tablet [id number]  Take 500 mg by mouth daily. [provider]  Active Child, Pharmacy Records  midodrine  (PROAMATINE ) 10 MG tablet [id number]  Take 10 mg by mouth 3 (three) times daily. [provider]  Active Child, Pharmacy Records           Med Note (COFFELL, JON HERO [place] Oct 30, 2023  9:37 AM) Per discharge paperwork for admission 9/28-10/13, midodrine  was discontinued. Patient's son denies this and states the patient's blood pressure is too low to stop giving the medication.  Multiple Vitamins-Minerals (CENTRUM SILVER  ULTRA WOMENS PO) [id number]  Take 1 tablet by mouth daily. [provider]  Active Child, Pharmacy Records  naloxone [facility]) nasal spray 4 mg/0.1 mL [id number]  Place 1 spray into the nose as needed (accidental overdose).  Patient not taking: Reported on 11/07/2023   [provider]  Active Child, Pharmacy Records           Med Note ROLENE  EVERN PULLING A   Wed Mar 08, 2023  2:39 PM)    nystatin  cream (MYCOSTATIN ) [id number]  Apply 1 Application topically 2 (two) times daily as needed for dry skin. Cheryle Page, MD  Active Child, Pharmacy Records  pantoprazole  (PROTONIX ) 40 MG tablet [id number]  Take 1 tablet (40 mg total) by mouth 2 (two) times daily before a meal. [name], MD  Active Child, Pharmacy Records  potassium chloride  (KLOR-CON ) 10 MEQ tablet [id number]  Take 2 tablets (20 mEq total) by mouth 2 (two) times daily. [name], MD  Active Child, Pharmacy Records  rifaximin  (XIFAXAN ) 550 MG TABS tablet [id number]  Take 550 mg by mouth 2 (two) times daily. [provider]  Active Child, Pharmacy Records  rOPINIRole  (REQUIP ) 0.25 MG tablet [id number]  TAKE 1 TABLET(0.25 MG) BY MOUTH AT BEDTIME [name], MD  Active Child, Pharmacy Records  senna-docusate (SENOKOT-S) 8.6-50 MG tablet [id number]  Take 1 tablet by mouth 2 (two) times daily. Duanne Butler DASEN, MD  Active   spironolactone  (ALDACTONE ) 50 MG tablet [id number]  Take 1 tablet (50 mg total) by mouth daily. Aguiar, Rafaela, MD  Active   Tapentadol HCl 200 MG TB12 [id number]  Take 200 mg by mouth as needed. [provider]  Active   triamcinolone  cream (KENALOG ) 0.1 % [id number]  Apply 1 Application topically 2 (two) times daily as needed (skin irritation). [provider]  Active Child, Pharmacy Records  venlafaxine  XR (EFFEXOR -XR) 75 MG 24 hr capsule [id number]  Take 1 capsule (75 mg total) by mouth daily with breakfast. Duanne Butler DASEN, MD  Active   Vitamin D , Ergocalciferol , (DRISDOL ) 50000 UNITS CAPS [id number]  Take 50,000 Units by mouth every Wednesday. [provider]  Active Child, Pharmacy Records  Med List Note Geri, Jon HERO, CPhT 10/30/23 9080): [name] (son) handles all medication            Goals Addressed             This Visit's Progress    COMPLETED: VBCI Transitions of Care (TOC) Care Plan       Problems:  Recent Hospitalization for treatment of hepatic encephalopathy/ cirrhosis Per patient and patient's son [name] DPR who reports patient is doing fine, reports compliance with lactulose  and pt having several bowel movements per day, blood pressure is being checked daily by son 12/13/23-- no new concerns reported today, states  everything going well , per son, pt may be evaluated for transplant  Goal:  Over the next 30 days, the patient will not experience hospital readmission  Interventions:  Transitions of Care: Doctor Visits  - discussed the importance of doctor visits Reviewed Signs and symptoms of infection Evaluation of current  treatment plan related to acute hepatic encephalopathy, cirrhosis, self-management and patient's adherence to plan as established by provider. Discussed plans with patient for ongoing care management follow up and provided patient with direct contact information for care management team Evaluation of current treatment plan related to cirrhosis  and patient's adherence to plan as established by provider Reviewed medications with patient and discussed importance of taking all medications as prescribed.  Reviewed compliance with lactulose  and importance of regular bowel movements to keep ammonia levels in check. Reviewed scheduled/upcoming provider appointments  Reinofrced low sodium diet Reviewed importance of daily weight and keeping a log Reviewed importance of adequate protein in diet  Reinforced bleeding risk due to cirrhosis Encouraged to observe for any signs of infection, confusion and get medical treatment for infections  asap Pain assessment completed Reviewed plan of care including transfer to longitudinal case manager- order placed  Patient Self Care Activities:  Attend all scheduled provider appointments Attend church or other social activities Call pharmacy for medication refills 3-7 days in advance of running out of medications Call provider office for new concerns or questions  Notify RN Care Manager of TOC call rescheduling needs Participate in Transition of Care Program/Attend TOC scheduled calls Perform all self care activities independently  Take medications as prescribed   Work with the social worker to address care coordination needs and will continue to work with the clinical team to address health care and disease management related needs Do not skip lactulose , it is important to keep ammonia levels in check Provide adequate protein in diet  Get emergency assistance for bleeding- rectal blood, dark black stools or vomiting of blood, confusion (increase in ammonia  level) Transition of care case closure, you will be outreached to schedule appointment with longitudinal case manager  Plan:  Transfer to longitudinal case manager        Recommendation:   PCP Follow-up  Follow Up Plan:   Closing From:  Transitions of Care Program  Mliss Creed [facility], BSN RN Care Manager/ Transition of Care [place]/ [facility] Population Health [phone]

## 2023-12-13 NOTE — Patient Instructions (Signed)
 Visit Information  Thank you for taking time to visit with me today. Please don't hesitate to contact me if I can be of assistance to you before our next scheduled telephone appointment.   Following is a copy of your care plan:   Goals Addressed             This Visit's Progress    COMPLETED: VBCI Transitions of Care (TOC) Care Plan       Problems:  Recent Hospitalization for treatment of hepatic encephalopathy/ cirrhosis Per patient and patient's son [name] DPR who reports patient is doing fine, reports compliance with lactulose  and pt having several bowel movements per day, blood pressure is being checked daily by son 12/13/23-- no new concerns reported today, states  everything going well , per son, pt may be evaluated for transplant  Goal:  Over the next 30 days, the patient will not experience hospital readmission  Interventions:  Transitions of Care: Doctor Visits  - discussed the importance of doctor visits Reviewed Signs and symptoms of infection Evaluation of current treatment plan related to acute hepatic encephalopathy, cirrhosis, self-management and patient's adherence to plan as established by provider. Discussed plans with patient for ongoing care management follow up and provided patient with direct contact information for care management team Evaluation of current treatment plan related to cirrhosis  and patient's adherence to plan as established by provider Reviewed medications with patient and discussed importance of taking all medications as prescribed.  Reviewed compliance with lactulose  and importance of regular bowel movements to keep ammonia levels in check. Reviewed scheduled/upcoming provider appointments  Reinofrced low sodium diet Reviewed importance of daily weight and keeping a log Reviewed importance of adequate protein in diet  Reinforced bleeding risk due to cirrhosis Encouraged to observe for any signs of infection, confusion and get medical  treatment for infections asap Pain assessment completed Reviewed plan of care including transfer to longitudinal case manager- order placed  Patient Self Care Activities:  Attend all scheduled provider appointments Attend church or other social activities Call pharmacy for medication refills 3-7 days in advance of running out of medications Call provider office for new concerns or questions  Notify RN Care Manager of TOC call rescheduling needs Participate in Transition of Care Program/Attend TOC scheduled calls Perform all self care activities independently  Take medications as prescribed   Work with the social worker to address care coordination needs and will continue to work with the clinical team to address health care and disease management related needs Do not skip lactulose , it is important to keep ammonia levels in check Provide adequate protein in diet  Get emergency assistance for bleeding- rectal blood, dark black stools or vomiting of blood, confusion (increase in ammonia level) Transition of care case closure, you will be outreached to schedule appointment with longitudinal case manager  Plan:  Transfer to longitudinal case manager        Care plan and visit instructions communicated with the patient verbally today. Patient agrees to receive a copy in MyChart. Active MyChart status and patient understanding of how to access instructions and care plan via MyChart confirmed with patient.     No further follow up required: case closure transition of care  Please call the care guide team at [phone] if you need to cancel or reschedule your appointment.   Please call the Suicide and Crisis Lifeline: 988 call the [place]  National Suicide Prevention Lifeline: 909 104 3876 or TTY: 403-569-6325 TTY 843-370-5073) to talk to a trained counselor  call 1-800-273-TALK (toll free, 24 hour hotline) go to [facility] Urgent [facility] [address], [place]  [phone]) call the [facility] Crisis Line: 726-233-3630 call 911 if you are experiencing a Mental Health or Behavioral Health Crisis or need someone to talk to.  Mliss Creed [facility], BSN RN Care Manager/ Transition of Care [place]/ [facility] [phone]

## 2023-12-14 ENCOUNTER — Emergency Department (HOSPITAL_COMMUNITY): Payer: Medicare (Managed Care)

## 2023-12-14 ENCOUNTER — Encounter (HOSPITAL_COMMUNITY): Payer: Self-pay

## 2023-12-14 ENCOUNTER — Other Ambulatory Visit: Payer: Self-pay

## 2023-12-14 ENCOUNTER — Observation Stay (HOSPITAL_COMMUNITY)
Admission: EM | Admit: 2023-12-14 | Discharge: 2023-12-16 | Disposition: A | Discharge status: Home / Self Care | Payer: Medicare (Managed Care) | Attending: Internal Medicine | Admitting: Internal Medicine

## 2023-12-14 DIAGNOSIS — F411 Generalized anxiety disorder: Secondary | ICD-10-CM | POA: Diagnosis not present

## 2023-12-14 DIAGNOSIS — Z79891 Long term (current) use of opiate analgesic: Secondary | ICD-10-CM | POA: Diagnosis not present

## 2023-12-14 DIAGNOSIS — D631 Anemia in chronic kidney disease: Secondary | ICD-10-CM | POA: Diagnosis not present

## 2023-12-14 DIAGNOSIS — J42 Unspecified chronic bronchitis: Secondary | ICD-10-CM | POA: Diagnosis not present

## 2023-12-14 DIAGNOSIS — K7581 Nonalcoholic steatohepatitis (NASH): Secondary | ICD-10-CM | POA: Diagnosis not present

## 2023-12-14 DIAGNOSIS — K7682 Hepatic encephalopathy: Principal | ICD-10-CM | POA: Diagnosis present

## 2023-12-14 DIAGNOSIS — E039 Hypothyroidism, unspecified: Secondary | ICD-10-CM | POA: Diagnosis not present

## 2023-12-14 DIAGNOSIS — G894 Chronic pain syndrome: Secondary | ICD-10-CM | POA: Diagnosis not present

## 2023-12-14 DIAGNOSIS — G2581 Restless legs syndrome: Secondary | ICD-10-CM | POA: Insufficient documentation

## 2023-12-14 DIAGNOSIS — D509 Iron deficiency anemia, unspecified: Secondary | ICD-10-CM | POA: Diagnosis not present

## 2023-12-14 DIAGNOSIS — E785 Hyperlipidemia, unspecified: Secondary | ICD-10-CM | POA: Insufficient documentation

## 2023-12-14 DIAGNOSIS — Z79899 Other long term (current) drug therapy: Secondary | ICD-10-CM | POA: Diagnosis not present

## 2023-12-14 DIAGNOSIS — Z87891 Personal history of nicotine dependence: Secondary | ICD-10-CM | POA: Diagnosis not present

## 2023-12-14 DIAGNOSIS — R109 Unspecified abdominal pain: Secondary | ICD-10-CM | POA: Diagnosis not present

## 2023-12-14 DIAGNOSIS — I6782 Cerebral ischemia: Secondary | ICD-10-CM | POA: Diagnosis not present

## 2023-12-14 DIAGNOSIS — I10 Essential (primary) hypertension: Secondary | ICD-10-CM | POA: Diagnosis not present

## 2023-12-14 DIAGNOSIS — R051 Acute cough: Secondary | ICD-10-CM | POA: Diagnosis present

## 2023-12-14 DIAGNOSIS — N1831 Chronic kidney disease, stage 3a: Secondary | ICD-10-CM | POA: Diagnosis not present

## 2023-12-14 DIAGNOSIS — D638 Anemia in other chronic diseases classified elsewhere: Secondary | ICD-10-CM | POA: Diagnosis present

## 2023-12-14 DIAGNOSIS — E669 Obesity, unspecified: Secondary | ICD-10-CM | POA: Diagnosis not present

## 2023-12-14 DIAGNOSIS — M549 Dorsalgia, unspecified: Secondary | ICD-10-CM | POA: Insufficient documentation

## 2023-12-14 DIAGNOSIS — R350 Frequency of micturition: Secondary | ICD-10-CM | POA: Diagnosis present

## 2023-12-14 DIAGNOSIS — Z6838 Body mass index (BMI) 38.0-38.9, adult: Secondary | ICD-10-CM | POA: Diagnosis not present

## 2023-12-14 DIAGNOSIS — R600 Localized edema: Secondary | ICD-10-CM

## 2023-12-14 DIAGNOSIS — R54 Age-related physical debility: Secondary | ICD-10-CM | POA: Diagnosis not present

## 2023-12-14 DIAGNOSIS — I129 Hypertensive chronic kidney disease with stage 1 through stage 4 chronic kidney disease, or unspecified chronic kidney disease: Secondary | ICD-10-CM | POA: Insufficient documentation

## 2023-12-14 DIAGNOSIS — R4182 Altered mental status, unspecified: Secondary | ICD-10-CM | POA: Diagnosis not present

## 2023-12-14 DIAGNOSIS — R410 Disorientation, unspecified: Secondary | ICD-10-CM | POA: Diagnosis present

## 2023-12-14 DIAGNOSIS — K7469 Other cirrhosis of liver: Secondary | ICD-10-CM | POA: Diagnosis present

## 2023-12-14 DIAGNOSIS — D696 Thrombocytopenia, unspecified: Secondary | ICD-10-CM | POA: Diagnosis not present

## 2023-12-14 LAB — COMPREHENSIVE METABOLIC PANEL WITH GFR
ALT: 20 U/L (ref 0–44)
AST: 33 U/L (ref 15–41)
Albumin: 3.1 g/dL — ABNORMAL LOW (ref 3.5–5.0)
Alkaline Phosphatase: 126 U/L (ref 38–126)
Anion gap: 10 (ref 5–15)
BUN: 23 mg/dL (ref 8–23)
CO2: 18 mmol/L — ABNORMAL LOW (ref 22–32)
Calcium: 8.7 mg/dL — ABNORMAL LOW (ref 8.9–10.3)
Chloride: 111 mmol/L (ref 98–111)
Creatinine, Ser: 1.16 mg/dL — ABNORMAL HIGH (ref 0.44–1.00)
GFR, Estimated: 52 mL/min — ABNORMAL LOW (ref >60)
Glucose, Bld: 142 mg/dL — ABNORMAL HIGH (ref 70–99)
Potassium: 3.6 mmol/L (ref 3.5–5.1)
Sodium: 139 mmol/L (ref 135–145)
Total Bilirubin: 1.3 mg/dL — ABNORMAL HIGH (ref 0.0–1.2)
Total Protein: 5.8 g/dL — ABNORMAL LOW (ref 6.5–8.1)

## 2023-12-14 LAB — CBC WITH DIFFERENTIAL/PLATELET
Abs Immature Granulocytes: 0.03 K/uL (ref 0.00–0.07)
Basophils Absolute: 0.1 K/uL (ref 0.0–0.1)
Basophils Relative: 1 %
Eosinophils Absolute: 0.7 K/uL — ABNORMAL HIGH (ref 0.0–0.5)
Eosinophils Relative: 10 %
HCT: 41.1 % (ref 36.0–46.0)
Hemoglobin: 11.8 g/dL — ABNORMAL LOW (ref 12.0–15.0)
Immature Granulocytes: 0 %
Lymphocytes Relative: 19 %
Lymphs Abs: 1.4 K/uL (ref 0.7–4.0)
MCH: 31.4 pg (ref 26.0–34.0)
MCHC: 28.7 g/dL — ABNORMAL LOW (ref 30.0–36.0)
MCV: 109.3 fL — ABNORMAL HIGH (ref 80.0–100.0)
Monocytes Absolute: 0.5 K/uL (ref 0.1–1.0)
Monocytes Relative: 6 %
Neutro Abs: 4.9 K/uL (ref 1.7–7.7)
Neutrophils Relative %: 64 %
Platelets: 137 K/uL — ABNORMAL LOW (ref 150–400)
RBC: 3.76 MIL/uL — ABNORMAL LOW (ref 3.87–5.11)
RDW: 15.3 % (ref 11.5–15.5)
WBC: 7.6 K/uL (ref 4.0–10.5)
nRBC: 0 % (ref 0.0–0.2)

## 2023-12-14 LAB — CBG MONITORING, ED: Glucose-Capillary: 131 mg/dL — ABNORMAL HIGH (ref 70–99)

## 2023-12-14 LAB — URINALYSIS, ROUTINE W REFLEX MICROSCOPIC
Bacteria, UA: NONE SEEN
Bilirubin Urine: NEGATIVE
Glucose, UA: NEGATIVE mg/dL
Hgb urine dipstick: NEGATIVE
Ketones, ur: NEGATIVE mg/dL
Nitrite: NEGATIVE
Protein, ur: NEGATIVE mg/dL
Specific Gravity, Urine: 1.023 (ref 1.005–1.030)
pH: 6 (ref 5.0–8.0)

## 2023-12-14 LAB — RESP PANEL BY RT-PCR (RSV, FLU A&B, COVID)  RVPGX2
Influenza A by PCR: NEGATIVE
Influenza B by PCR: NEGATIVE
Resp Syncytial Virus by PCR: NEGATIVE
SARS Coronavirus 2 by RT PCR: NEGATIVE

## 2023-12-14 LAB — AMMONIA: Ammonia: 150 umol/L — ABNORMAL HIGH (ref 9–35)

## 2023-12-14 MED ORDER — ATORVASTATIN CALCIUM 40 MG PO TABS
40.0000 mg | ORAL_TABLET | Freq: Every day | ORAL | Status: DC
  Administered 2023-12-15 – 2023-12-18 (×4): 40 mg via ORAL
  Filled 2023-12-14 (×4): qty 1

## 2023-12-14 MED ORDER — RIFAXIMIN 550 MG PO TABS
550.0000 mg | ORAL_TABLET | Freq: Two times a day (BID) | ORAL | Status: DC
  Administered 2023-12-14 – 2023-12-18 (×8): 550 mg via ORAL
  Filled 2023-12-14 (×8): qty 1

## 2023-12-14 MED ORDER — ALBUTEROL SULFATE (2.5 MG/3ML) 0.083% IN NEBU: 2.5000 mg | INHALATION_SOLUTION | Freq: Four times a day (QID) | RESPIRATORY_TRACT | Status: DC | PRN

## 2023-12-14 MED ORDER — SENNOSIDES-DOCUSATE SODIUM 8.6-50 MG PO TABS
1.0000 | ORAL_TABLET | Freq: Every evening | ORAL | Status: DC | PRN
  Filled 2023-12-14: qty 1

## 2023-12-14 MED ORDER — ALBUTEROL SULFATE HFA 108 (90 BASE) MCG/ACT IN AERS: 2.0000 | INHALATION_SPRAY | Freq: Four times a day (QID) | RESPIRATORY_TRACT | Status: DC | PRN

## 2023-12-14 MED ORDER — DICYCLOMINE HCL 10 MG PO CAPS
10.0000 mg | ORAL_CAPSULE | Freq: Three times a day (TID) | ORAL | Status: DC | PRN
  Administered 2023-12-17: 10 mg via ORAL
  Filled 2023-12-14: qty 1

## 2023-12-14 MED ORDER — VENLAFAXINE HCL ER 75 MG PO CP24
75.0000 mg | ORAL_CAPSULE | Freq: Every day | ORAL | Status: DC
  Administered 2023-12-15 – 2023-12-18 (×4): 75 mg via ORAL
  Filled 2023-12-14 (×4): qty 1

## 2023-12-14 MED ORDER — MIDODRINE HCL 5 MG PO TABS
10.0000 mg | ORAL_TABLET | Freq: Three times a day (TID) | ORAL | Status: DC
  Administered 2023-12-15 – 2023-12-18 (×10): 10 mg via ORAL
  Filled 2023-12-14 (×10): qty 2

## 2023-12-14 MED ORDER — PROCHLORPERAZINE EDISYLATE 10 MG/2ML IJ SOLN: 10.0000 mg | Freq: Four times a day (QID) | INTRAMUSCULAR | Status: DC | PRN

## 2023-12-14 MED ORDER — LEVOTHYROXINE SODIUM 50 MCG PO TABS
175.0000 ug | ORAL_TABLET | Freq: Every day | ORAL | Status: DC
  Administered 2023-12-15 – 2023-12-18 (×4): 175 ug via ORAL
  Filled 2023-12-14 (×4): qty 1

## 2023-12-14 MED ORDER — SPIRONOLACTONE 25 MG PO TABS
50.0000 mg | ORAL_TABLET | Freq: Every day | ORAL | Status: DC
  Administered 2023-12-15 – 2023-12-18 (×4): 50 mg via ORAL
  Filled 2023-12-14 (×4): qty 2

## 2023-12-14 MED ORDER — LACTULOSE 10 GM/15ML PO SOLN
20.0000 g | Freq: Once | ORAL | Status: AC
  Administered 2023-12-14: 20 g via ORAL
  Filled 2023-12-14: qty 30

## 2023-12-14 MED ORDER — FUROSEMIDE 40 MG PO TABS
40.0000 mg | ORAL_TABLET | Freq: Every day | ORAL | Status: DC
  Administered 2023-12-15 – 2023-12-18 (×4): 40 mg via ORAL
  Filled 2023-12-14 (×4): qty 1

## 2023-12-14 MED ORDER — PANTOPRAZOLE SODIUM 40 MG PO TBEC
40.0000 mg | DELAYED_RELEASE_TABLET | Freq: Two times a day (BID) | ORAL | Status: DC
  Administered 2023-12-15 – 2023-12-18 (×7): 40 mg via ORAL
  Filled 2023-12-14 (×7): qty 1

## 2023-12-14 MED ORDER — FENTANYL CITRATE (PF) 50 MCG/ML IJ SOSY
25.0000 ug | PREFILLED_SYRINGE | Freq: Once | INTRAMUSCULAR | Status: AC
  Administered 2023-12-14: 25 ug via INTRAVENOUS
  Filled 2023-12-14: qty 1

## 2023-12-14 MED ORDER — POTASSIUM CHLORIDE CRYS ER 10 MEQ PO TBCR
20.0000 meq | EXTENDED_RELEASE_TABLET | Freq: Two times a day (BID) | ORAL | Status: DC
  Administered 2023-12-14 – 2023-12-18 (×8): 20 meq via ORAL
  Filled 2023-12-14 (×8): qty 2

## 2023-12-14 MED ORDER — ROPINIROLE HCL 0.25 MG PO TABS
0.2500 mg | ORAL_TABLET | Freq: Every day | ORAL | Status: DC
  Administered 2023-12-14 – 2023-12-17 (×4): 0.25 mg via ORAL
  Filled 2023-12-14 (×4): qty 1

## 2023-12-14 MED ORDER — LACTULOSE 10 GM/15ML PO SOLN
30.0000 g | Freq: Four times a day (QID) | ORAL | Status: DC
  Administered 2023-12-14 – 2023-12-18 (×12): 30 g via ORAL
  Filled 2023-12-14 (×13): qty 45

## 2023-12-14 NOTE — ED Provider Notes (Signed)
 I provided a substantive portion of the care of this patient.  I personally made/approved the management plan for this patient and take responsibility for the patient management.  EKG Interpretation Date/Time:  Thursday December 14 2023 14:37:23 EST Ventricular Rate:  84 PR Interval:  186 QRS Duration:  93 QT Interval:  402 QTC Calculation: 476 R Axis:   1  Text Interpretation: Sinus rhythm Anterior infarct, old Borderline ST elevation, inferior leads Confirmed by Dasie Faden ([id number]) on 12/14/2023 3:29:37 PM    The patient is EKG shows sinus rhythm.  Patient would alter medical status.  Has ammonia level 150.  Will be treated for hepatic encephalopathy admitted to the hospital   Dasie Faden, MD 12/14/23 [phone]

## 2023-12-14 NOTE — ED Provider Notes (Signed)
 [place] EMERGENCY DEPARTMENT AT [facility] Provider Note   CSN: [id number] Arrival date & time: 12/14/23  1336     Patient presents with: Altered Mental Status   [name] is a 68 y.o. female.  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  Patient's son brought patient to hospital today with concerns of increasing confusion since this morning.  States that he routinely monitors her and has several key questions he asked her every morning and she appears to have struggle to answer any of these questions today.  In an effort to try to correct this, son given extra dose of lactulose .  No recent fever but patient is reporting a cough and urinary discomfort.  During last 2 ED visits, patient was found to have UTIs and was hospitalized during these encephalopathic episodes.   Altered Mental Status Presenting symptoms: confusion        Prior to Admission medications   Medication Sig Start Date End Date Taking? Authorizing Provider  acetaminophen  (TYLENOL ) 500 MG tablet Take 500-1,000 mg by mouth every 6 (six) hours as needed for headache or fever (pain).    [provider]  albuterol  (VENTOLIN  HFA) 108 (90 Base) MCG/ACT inhaler Inhale 2 puffs into the lungs every 6 (six) hours as needed for shortness of breath or wheezing. 02/13/23   [provider]  albuterol  (VENTOLIN  HFA) 108 (90 Base) MCG/ACT inhaler Inhale 2 puffs into the lungs every 6 (six) hours as needed for wheezing or shortness of breath. 11/13/23   [name], MD  atorvastatin  (LIPITOR) 40 MG tablet TAKE 1 TABLET(40 MG) BY MOUTH IN THE MORNING 09/18/23   [name], MD  benzonatate  (TESSALON  PERLES) 100 MG capsule Take 1 capsule (100 mg total) by mouth 3 (three) times daily as needed.  11/13/23   [name], MD  copper  tablet Take 2 mg by mouth in the morning and at bedtime.    [provider]  cyanocobalamin  (VITAMIN B12) 1000 MCG tablet Take 2,000 mcg by mouth every Monday, Wednesday, and Friday.    [provider]  dicyclomine  (BENTYL ) 10 MG capsule Take 1 capsule (10 mg total) by mouth 3 (three) times daily as needed for spasms (abdominal cramping). 08/07/23   [name], MD  EPINEPHrine  0.3 mg/0.3 mL IJ SOAJ injection Inject 0.3 mg into the muscle as needed for anaphylaxis. 07/27/23   [name], MD  famotidine  (PEPCID ) 20 MG tablet Take 20 mg by mouth daily as needed for heartburn or indigestion.    [provider]  furosemide  (LASIX ) 40 MG tablet Take 1 tablet (40 mg total) by mouth 2 (two) times daily. 10/25/23   [name], MD  HYDROcodone -acetaminophen  (NORCO) 10-325 MG tablet Take 1 tablet by mouth every 8 (eight) hours as needed (for pain). 06/15/23   [provider]  IRON -VITAMIN C  PO Take 1 tablet by mouth daily.    [provider]  lactulose  (CHRONULAC ) 10 GM/15ML solution Take 45 mLs (30 g total) by mouth 4 (four) times daily. 10/23/23   Barbarann Nest, MD  levothyroxine  (SYNTHROID ) 175 MCG tablet TAKE 1 TABLET BY MOUTH DAILY A 6AM. RECHECK TSH WITH PCP IN 4 WEEKS 09/20/23   [name], MD  magnesium  gluconate (MAGONATE) 500 (27 Mg) MG TABS tablet Take 1 tablet (500 mg total) by mouth in the morning and  at bedtime. 05/01/23   [name], MD  megestrol  (MEGACE ) 40 MG tablet Take 1 tablet (40 mg total) by mouth 2 (two) times daily. 05/04/23   Ajewole, [name], MD  methocarbamol  (ROBAXIN ) 500 MG tablet Take 500 mg by mouth daily.    [provider]  midodrine  (PROAMATINE ) 10 MG tablet Take 10 mg by mouth 3 (three) times daily.    [provider]  Multiple Vitamins-Minerals (CENTRUM SILVER  ULTRA WOMENS PO) Take 1 tablet by mouth daily.    [provider]  naloxone [facility])  nasal spray 4 mg/0.1 mL Place 1 spray into the nose as needed (accidental overdose). Patient not taking: Reported on 11/07/2023 03/25/22   [provider]  nystatin  cream (MYCOSTATIN ) Apply 1 Application topically 2 (two) times daily as needed for dry skin. 05/29/23   Cheryle Page, MD  pantoprazole  (PROTONIX ) 40 MG tablet Take 1 tablet (40 mg total) by mouth 2 (two) times daily before a meal. 06/09/23   [name], MD  potassium chloride  (KLOR-CON ) 10 MEQ tablet Take 2 tablets (20 mEq total) by mouth 2 (two) times daily. 08/07/23   [name], MD  rifaximin  (XIFAXAN ) 550 MG TABS tablet Take 550 mg by mouth 2 (two) times daily.    [provider]  rOPINIRole  (REQUIP ) 0.25 MG tablet TAKE 1 TABLET(0.25 MG) BY MOUTH AT BEDTIME 06/10/23   [name], MD  senna-docusate (SENOKOT-S) 8.6-50 MG tablet Take 1 tablet by mouth 2 (two) times daily. 11/30/23   Duanne Butler DASEN, MD  spironolactone  (ALDACTONE ) 50 MG tablet Take 1 tablet (50 mg total) by mouth daily. 11/13/23   Aguiar, Rafaela, MD  Tapentadol HCl 200 MG TB12 Take 200 mg by mouth as needed. 11/02/23   [provider]  triamcinolone  cream (KENALOG ) 0.1 % Apply 1 Application topically 2 (two) times daily as needed (skin irritation).    [provider]  venlafaxine  XR (EFFEXOR -XR) 75 MG 24 hr capsule Take 1 capsule (75 mg total) by mouth daily with breakfast. 12/13/23   Duanne Butler DASEN, MD  Vitamin D , Ergocalciferol , (DRISDOL ) 50000 UNITS CAPS Take 50,000 Units by mouth every Wednesday.    [provider]    Allergies: Ace inhibitors, Gadolinium derivatives, Ivp dye [iodinated contrast media], Wound dressing adhesive, Desitin [zinc  oxide], Butrans [buprenorphine], Yellow jacket venom, Hibiclens  [chlorhexidine  gluconate], Penicillins, Vibramycin [doxycycline], and Zofran  [ondansetron ]    Review of Systems  Psychiatric/Behavioral:  Positive for confusion.   All other systems reviewed and are  negative.   Updated Vital Signs BP 124/71   Pulse 78   Temp 98.3 F (36.8 C) (Oral)   Resp 18   Ht 5' 2 (1.575 m)   Wt 92.5 kg   SpO2 100%   BMI 37.31 kg/m   Physical Exam Vitals and nursing note reviewed.  Constitutional:      General: She is not in acute distress.    Appearance: She is well-developed.  HENT:     Head: Normocephalic and atraumatic.  Eyes:     Conjunctiva/sclera: Conjunctivae normal.  Cardiovascular:     Rate and Rhythm: Normal rate and regular rhythm.     Heart sounds: No murmur heard. Pulmonary:     Effort: Pulmonary effort is normal. No respiratory distress.     Breath sounds: Normal breath sounds.  Abdominal:     Palpations: Abdomen is soft.     Tenderness: There is no abdominal tenderness.  Musculoskeletal:        General: No swelling.  Cervical back: Neck supple.  Skin:    General: Skin is warm and dry.     Capillary Refill: Capillary refill takes less than 2 seconds.  Neurological:     General: No focal deficit present.     Mental Status: She is alert and oriented to person, place, and time.     Comments: Aox3. No acute findings of weakness in upper or lower extremities.  Psychiatric:        Mood and Affect: Mood normal.     (all labs ordered are listed, but only abnormal results are displayed) Labs Reviewed  CBC WITH DIFFERENTIAL/PLATELET - Abnormal; Notable for the following components:      Result Value   RBC 3.76 (*)    Hemoglobin 11.8 (*)    MCV 109.3 (*)    MCHC 28.7 (*)    Platelets 137 (*)    Eosinophils Absolute 0.7 (*)    All other components within normal limits  COMPREHENSIVE METABOLIC PANEL WITH GFR - Abnormal; Notable for the following components:   CO2 18 (*)    Glucose, Bld 142 (*)    Creatinine, Ser 1.16 (*)    Calcium  8.7 (*)    Total Protein 5.8 (*)    Albumin  3.1 (*)    Total Bilirubin 1.3 (*)    GFR, Estimated 52 (*)    All other components within normal limits  URINALYSIS, ROUTINE W REFLEX  MICROSCOPIC - Abnormal; Notable for the following components:   Leukocytes,Ua SMALL (*)    All other components within normal limits  AMMONIA - Abnormal; Notable for the following components:   Ammonia 150 (*)    All other components within normal limits  CBG MONITORING, ED - Abnormal; Notable for the following components:   Glucose-Capillary 131 (*)    All other components within normal limits  RESP PANEL BY RT-PCR (RSV, FLU A&B, COVID)  [id number]    EKG: EKG Interpretation Date/Time:  Thursday December 14 2023 14:37:23 EST Ventricular Rate:  84 PR Interval:  186 QRS Duration:  93 QT Interval:  402 QTC Calculation: 476 R Axis:   1  Text Interpretation: Sinus rhythm Anterior infarct, old Borderline ST elevation, inferior leads Confirmed by Dasie Faden ([id number]) on 12/14/2023 3:29:37 PM  Radiology: CT Head Wo Contrast Result Date: 12/14/2023 EXAM: CT HEAD WITHOUT CONTRAST 12/14/2023 04:53:00 PM TECHNIQUE: CT of the head was performed without the administration of intravenous contrast. Automated exposure control, iterative reconstruction, and/or weight based adjustment of the mA/kV was utilized to reduce the radiation dose to as low as reasonably achievable. COMPARISON: Head CT 10/29/2023 and MRI 10/17/2022. CLINICAL HISTORY: Mental status change, unknown cause. FINDINGS: BRAIN AND VENTRICLES: There is no evidence of an acute infarct, intracranial hemorrhage, mass, midline shift, hydrocephalus, or extra-axial fluid collection. Cerebral volume is within normal limits for age. Patchy hypodensities in the cerebral white matter are similar to the prior CT and nonspecific but compatible with moderate chronic small vessel ischemic disease. Calcified atherosclerosis at the skull base. ORBITS: Left cataract extraction. SINUSES: No acute abnormality. SOFT TISSUES AND SKULL: No acute soft tissue abnormality. No skull fracture. IMPRESSION: 1. No acute intracranial abnormality. 2. Moderate chronic small  vessel ischemic disease. Electronically signed by: Dasie Hamburg MD 12/14/2023 05:45 PM EST RP Workstation: [id number]   DG Chest Portable 1 View Result Date: 12/14/2023 CLINICAL DATA:  Altered mental status EXAM: PORTABLE CHEST 1 VIEW COMPARISON:  Prior chest x-ray 10/29/2023 FINDINGS: The heart size and mediastinal contours are within normal limits.  Both lungs are clear save for mild chronic bronchitic changes. The visualized skeletal structures are unremarkable. Atherosclerotic vascular calcifications present in the transverse aorta. IMPRESSION: No active disease. Electronically Signed   By: Wilkie Lent M.D.   On: 12/14/2023 15:39     Procedures   Medications Ordered in the ED  fentaNYL  (SUBLIMAZE ) injection 25 mcg (25 mcg Intravenous Given 12/14/23 1526)  lactulose  (CHRONULAC ) 10 GM/15ML solution 20 g (20 g Oral Given 12/14/23 1552)                                    Medical Decision Making Amount and/or Complexity of Data Reviewed Labs: ordered. Radiology: ordered.  Risk Prescription drug management. Decision regarding hospitalization.   This patient presents to the ED for concern of altered mental status, this involves an extensive number of treatment options, and is a complaint that carries with it a high risk of complications and morbidity.  The differential diagnosis includes hepatic encephalopathy, UTI, stroke, AKI hydration   Co morbidities that complicate the patient evaluation  Cirrhosis secondary to NASH, multiple hospitalizations for hepatic encephalopathy, chronic diarrhea   Additional history obtained:  Additional history obtained from chart review   Lab Tests:  I Ordered, and personally interpreted labs.  The pertinent results include: CBC unremarkable stable hemoglobin 11.8, CMP baseline renal function with slight dehydration seen with creatinine 1.16, CBG unremarkable at 131, ammonia elevated at 150, respiratory panel negative, UA negative for acute  signs of infection but some white blood cells seen   Imaging Studies ordered:  I ordered imaging studies including chest x-ray, CT head I independently visualized and interpreted imaging which showed chest x-ray unremarkable, CT head unremarkable I agree with the radiologist interpretation   Cardiac Monitoring: / EKG:  The patient was maintained on a cardiac monitor.  I personally viewed and interpreted the cardiac monitored which showed an underlying rhythm of: Sinus rhythm   Consultations Obtained:  I requested consultation with the hospitalist,  and discussed lab and imaging findings as well as pertinent plan - they recommend: Spoke with Dr. Tobie, hospitalist, who will be admitting patient.   Problem List / ED Course / Critical interventions / Medication management  Patient with past history significant for cirrhosis secondary to NASH, GERD, hypothyroidism, systolic murmur, home CKD stage IIIa, multiple episodes of hepatic encephalopathy here with concerns of altered mental status.  Patient was brought in by her son with concerns of increased confusion.  Reports that started this morning.  He has noted some increasing urinary frequency as well as a cough.  He did give her an extra dose of lactulose  to try to help with what he is assuming is related to her cirrhosis. On exam, patient does appear to be fairly alert and oriented and is answering all questions appropriately.  She struggles to answer time orientation question which is reportedly her baseline.  She otherwise can tell me her location, self, and general events around our current present day.  She has some mild tenderness in the abdomen to the suprapubic region.  Some crackles heard to bilateral lung bases. Lab workup is largely reassuring but patient does have notable elevation in her ammonia level at 150.  She appears to have been compliant with her lactulose  unclear why patient has had this elevation of this is possible  worsening of her cirrhosis.  There is no significant abdominal discomfort and no reported vomiting.  Will give a dose of lactulose  and plan for admission given no other clear source of cause of her symptoms beyond likely hepatic encephalopathy. Spoke with Dr. Tobie, hospitalist, will be admitting patient. I ordered medication including fentanyl , lactulose  for pain, elevated ammonia Reevaluation of the patient after these medicines showed that the patient improved I have reviewed the patients home medicines and have made adjustments as needed   Social Determinants of Health:  Cirrhosis secondary to NASH   Test / Admission - Considered:  Patient requiring admission.  Final diagnoses:  Hepatic encephalopathy [facility])    ED Discharge Orders     None          Cecily Legrand DELENA DEVONNA 12/14/23 1946    Dasie Faden, MD 12/18/23 [phone]

## 2023-12-14 NOTE — ED Triage Notes (Signed)
 Pt arrives with son who reports that since this morning pt has been increasingly confused. Pt A+Ox3 (unsure of time). Pt with hx of hepatic encephalopathy and UTI that cause confusion. Pt's son states she had extra dose of lactulose  this morning.

## 2023-12-14 NOTE — H&P (Signed)
 History and Physical    [name] FMW:[id number] DOB: 1955/07/05 DOA: 12/14/2023  PCP: [name], MD  Patient coming from: Home  I have personally briefly reviewed patient's old medical records in [facility]  Chief Complaint: Confusion  HPI: [name] is a 68 y.o. female with medical history significant for NASH cirrhosis with recurrent hepatic encephalopathy, CKD stage IIIa, iron  deficiency anemia, chronic thrombocytopenia, hypothyroidism, GAD, RLS, chronic back and abdominal pain, short gut syndrome with chronic diarrhea who presented to the ED for evaluation of altered mental status.  History supplemented by son at bedside.  Patient lives at home with her son.  She normally ambulates with use of a walker.  She has a history of recurrent hepatic encephalopathy.  Son states that even prior to her diagnosis of cirrhosis that she normally would have 5-7 bowel movements per day due to her short gut syndrome.  Her son monitors her mental status closely, asking her orientation questions every morning.  He states that this afternoon she started getting confused again.  She was having difficulty remembering what he told her earlier.  She had 1 bowel movement earlier in the day.  When he told her to flush the toilet, she was grabbing the toilet lid as if it was the flusher.  Patient has difficulty remembering the year at her normal baseline but is otherwise usually oriented to person, place, and month as she is at time of this admission.  She reports some nausea without emesis.  She reports chronic generalized abdominal pain which is not unchanged from baseline.  She is also reporting chronic back pain.  She reports increased urine output attributed to her diuretics without dysuria.  She has not seen any increased swelling of her legs or abdomen.  Son states that she has been taking her lactulose .  Previously she was taking it TID however in order to improve sleep he has been giving  it to her as 1.5x dosing twice daily as advised by her GI specialist.  She has been following with [facility] in [place].  She is being evaluated for possible Wilson's disease as she was noted to have low ceruloplasmin and serum copper  levels.  ED Course  Labs/Imaging on admission: I have personally reviewed following labs and imaging studies.  Initial vitals showed BP 142/101, pulse 87, RR 18, temp 98.2 F, SpO2 100% on room air.  Labs showed WBC 7.6, hemoglobin 11.8, platelets 137, sodium 139, potassium 3.6, bicarb 18, BUN 23, creatinine 1.16, serum glucose 142, AST 33, ALT 20, alk phos 126, total bilirubin 1.3.  Ammonia 150.  UA negative for UTI.  SARS-CoV-2, influenza, RSV PCR negative.  CT head without contrast negative for acute intracranial normality.  Portable chest x-ray negative for focal consolidation, edema, effusion.  Patient was given 20 g oral lactulose  and IV fentanyl .  The hospitalist service was consulted for admission.  Review of Systems:  All systems reviewed and are negative except as documented in history of present illness above.   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   Hyperammonemia 09/30/2022   Hypertension    Kidney stone    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   Obesity    Pulmonary emboli (HCC) 01/10/2009   Pulmonary embolism (HCC)    Short gut syndrome    Thyroid  disease     Past Surgical History:   Procedure Laterality Date   ABDOMINAL WALL MESH  REMOVAL     2016   CESAREAN SECTION WITH BILATERAL TUBAL LIGATION  04/30/1985   CHOLECYSTECTOMY  05/2007   Exploratory laparotomy, lysis of adhesions, takedown of ileostomy with small bowel resection, open cholecystectomy and ileocolostomy   COLON SURGERY  01/2007   exploratory laparotomy with right colecotmy and end ileostomy   COLONOSCOPY  04/02/2019   Dr Gunnar Chris Daniels, MD [facility]   ESOPHAGOGASTRODUODENOSCOPY (EGD) WITH PROPOFOL  N/A 10/19/2022   Procedure: ESOPHAGOGASTRODUODENOSCOPY (EGD) WITH PROPOFOL ;  Surgeon: Saintclair Jasper, MD;  Location: [facility];  Service: Gastroenterology;  Laterality: N/A;   ESOPHAGOSCOPY  04/02/2019   EYE SURGERY Left 10/06/2020   Cataract extraciton w/intraocular lens implant- Dr Caresse   HERNIA REPAIR  10/2007   ILEOSTOMY  2009   states bowel perfotation wih colonsocopy   ILEOSTOMY CLOSURE  2009   INCISIONAL HERNIA REPAIR  10/11/2018   IR KYPHO THORACIC WITH BONE BIOPSY  08/31/2022   IR KYPHO THORACIC WITH BONE BIOPSY  10/27/2022   IR RADIOLOGIST EVAL & MGMT  09/27/2022   KIDNEY STONE SURGERY     LIVER RESECTION     2009   PANNICULECTOMY     repair of abdominal wall hernia   WRIST SURGERY     tendonitis    Social History: Social History   Tobacco Use   Smoking status: Former    Current packs/day: 0.00    Types: Cigarettes    Quit date: 1987    Years since quitting: 38.9    Passive exposure: Past   Smokeless tobacco: Never  Vaping Use   Vaping status: Never Used  Substance Use Topics   Alcohol use: No   Drug use: No   Allergies  Allergen Reactions   Ace Inhibitors Anaphylaxis and Swelling   Gadolinium Derivatives Anaphylaxis   Ivp Dye [Iodinated Contrast Media] Anaphylaxis   Wound Dressing Adhesive Dermatitis   Desitin [Zinc  Oxide] Rash and Other (See Comments)    Worsens rash   Butrans [Buprenorphine] Other (See Comments)    Discontinued due to liver function   Yellow  Jacket Venom Swelling   Hibiclens  [Chlorhexidine  Gluconate] Rash and Other (See Comments)    Worsens rash   Penicillins Dermatitis and Rash   Vibramycin [Doxycycline] Rash   Zofran  [Ondansetron ] Rash    Family History  Problem Relation Age of Onset   Uterine cancer Mother    Bone cancer Father    Heart disease Father    Hyperlipidemia Father    Dementia Father    Hypertension Father    Skin cancer Sister    Hypertension Sister    Diabetes Sister    Hyperlipidemia Brother    Hypertension Brother    Heart disease Brother      Prior to Admission medications   Medication Sig Start Date End Date Taking? Authorizing Provider  acetaminophen  (TYLENOL ) 500 MG tablet Take 500-1,000 mg by mouth every 6 (six) hours as needed for headache or fever (pain).    [provider]  albuterol  (  VENTOLIN  HFA) 108 (90 Base) MCG/ACT inhaler Inhale 2 puffs into the lungs every 6 (six) hours as needed for shortness of breath or wheezing. 02/13/23   [provider]  albuterol  (VENTOLIN  HFA) 108 (90 Base) MCG/ACT inhaler Inhale 2 puffs into the lungs every 6 (six) hours as needed for wheezing or shortness of breath. 11/13/23   [name], MD  atorvastatin  (LIPITOR) 40 MG tablet TAKE 1 TABLET(40 MG) BY MOUTH IN THE MORNING 09/18/23   [name], MD  benzonatate  (TESSALON  PERLES) 100 MG capsule Take 1 capsule (100 mg total) by mouth 3 (three) times daily as needed. 11/13/23   [name], MD  copper  tablet Take 2 mg by mouth in the morning and at bedtime.    [provider]  cyanocobalamin  (VITAMIN B12) 1000 MCG tablet Take 2,000 mcg by mouth every Monday, Wednesday, and Friday.    [provider]  dicyclomine  (BENTYL ) 10 MG capsule Take 1 capsule (10 mg total) by mouth 3 (three) times daily as needed for spasms (abdominal cramping). 08/07/23   [name], MD  EPINEPHrine  0.3 mg/0.3 mL IJ SOAJ injection Inject 0.3 mg into the muscle as needed for anaphylaxis.  07/27/23   [name], MD  famotidine  (PEPCID ) 20 MG tablet Take 20 mg by mouth daily as needed for heartburn or indigestion.    [provider]  furosemide  (LASIX ) 40 MG tablet Take 1 tablet (40 mg total) by mouth 2 (two) times daily. 10/25/23   [name], MD  HYDROcodone -acetaminophen  (NORCO) 10-325 MG tablet Take 1 tablet by mouth every 8 (eight) hours as needed (for pain). 06/15/23   [provider]  IRON -VITAMIN C  PO Take 1 tablet by mouth daily.    [provider]  lactulose  (CHRONULAC ) 10 GM/15ML solution Take 45 mLs (30 g total) by mouth 4 (four) times daily. 10/23/23   Barbarann Nest, MD  levothyroxine  (SYNTHROID ) 175 MCG tablet TAKE 1 TABLET BY MOUTH DAILY A 6AM. RECHECK TSH WITH PCP IN 4 WEEKS 09/20/23   [name], MD  magnesium  gluconate (MAGONATE) 500 (27 Mg) MG TABS tablet Take 1 tablet (500 mg total) by mouth in the morning and at bedtime. 05/01/23   [name], MD  megestrol  (MEGACE ) 40 MG tablet Take 1 tablet (40 mg total) by mouth 2 (two) times daily. 05/04/23   Ajewole, [name], MD  methocarbamol  (ROBAXIN ) 500 MG tablet Take 500 mg by mouth daily.    [provider]  midodrine  (PROAMATINE ) 10 MG tablet Take 10 mg by mouth 3 (three) times daily.    [provider]  Multiple Vitamins-Minerals (CENTRUM SILVER  ULTRA WOMENS PO) Take 1 tablet by mouth daily.    [provider]  naloxone [facility]) nasal spray 4 mg/0.1 mL Place 1 spray into the nose as needed (accidental overdose). Patient not taking: Reported on 11/07/2023 03/25/22   [provider]  nystatin  cream (MYCOSTATIN ) Apply 1 Application topically 2 (two) times daily as needed for dry skin. 05/29/23   Cheryle Page, MD  pantoprazole  (PROTONIX ) 40 MG tablet Take 1 tablet (40 mg total) by mouth 2 (two) times daily before a meal. 06/09/23   [name], MD  potassium chloride  (KLOR-CON ) 10 MEQ tablet Take 2 tablets (20 mEq total) by mouth 2 (two)  times daily. 08/07/23   [name], MD  rifaximin  (XIFAXAN ) 550 MG TABS tablet Take 550 mg by mouth 2 (two) times daily.    [provider]  rOPINIRole  (REQUIP ) 0.25 MG tablet TAKE 1 TABLET(0.25  MG) BY MOUTH AT BEDTIME 06/10/23   [name], MD  senna-docusate (SENOKOT-S) 8.6-50 MG tablet Take 1 tablet by mouth 2 (two) times daily. 11/30/23   Duanne Butler DASEN, MD  spironolactone  (ALDACTONE ) 50 MG tablet Take 1 tablet (50 mg total) by mouth daily. 11/13/23   Aguiar, Rafaela, MD  Tapentadol HCl 200 MG TB12 Take 200 mg by mouth as needed. 11/02/23   [provider]  triamcinolone  cream (KENALOG ) 0.1 % Apply 1 Application topically 2 (two) times daily as needed (skin irritation).    [provider]  venlafaxine  XR (EFFEXOR -XR) 75 MG 24 hr capsule Take 1 capsule (75 mg total) by mouth daily with breakfast. 12/13/23   Duanne Butler DASEN, MD  Vitamin D , Ergocalciferol , (DRISDOL ) 50000 UNITS CAPS Take 50,000 Units by mouth every Wednesday.    [provider]    Physical Exam: Vitals:   12/14/23 1347 12/14/23 1403 12/14/23 1726  BP: (!) 142/101  124/71  Pulse: 87  78  Resp: 18  18  Temp: 98.2 F (36.8 C)  98.3 F (36.8 C)  TempSrc: Oral  Oral  SpO2: 100%  100%  Weight:  92.5 kg   Height:  5' 2 (1.575 m)    Constitutional: Chronically ill-appearing woman resting in bed.  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  EOMI intact on the right. ENMT: Mucous membranes are moist. Posterior pharynx clear of any exudate or lesions.Normal dentition.  Neck: normal, supple, no masses. Respiratory: clear to auscultation bilaterally, no wheezing, no crackles. Normal respiratory effort. No accessory muscle use.  Cardiovascular: Regular rate and rhythm, systolic murmur present. No extremity edema. 2+ pedal pulses. Abdomen: Soft, nondistended.  Mild generalized tenderness. Musculoskeletal: no clubbing / cyanosis. No joint  deformity upper and lower extremities. Good ROM, no contractures. Normal muscle tone.  Skin: no rashes, lesions, ulcers. No induration Neurologic: Sensation intact. Strength 5/5 in all 4.  Psychiatric: Alert and oriented to person, place, situation, and month.  Does not know the year.  EKG: Personally reviewed. Sinus rhythm, rate 84, no acute ischemic changes.  Assessment/Plan Principal Problem:   Hepatic encephalopathy (HCC) Active Problems:   Liver cirrhosis secondary to NASH (HCC)   Chronic kidney disease, stage 3a (HCC)   Thrombocytopenia (HCC) - Baseline 100-130K   Hypothyroidism   Anemia of chronic disease   Nohelani Benning is a 68 y.o. female with medical history significant for NASH cirrhosis with recurrent hepatic encephalopathy, CKD stage IIIa, iron  deficiency anemia, chronic thrombocytopenia, hypothyroidism, GAD, RLS, chronic back and abdominal pain, short gut syndrome with chronic diarrhea who is admitted with hepatic encephalopathy.  Assessment and Plan: NASH cirrhosis with hepatic encephalopathy: Recurrent issue despite reported adherence to lactulose .  Ammonia 150 on admission.  CT head negative for acute changes.  UA negative for UTI. - Lactulose  30 g 4 times daily - Continue rifaximin  550 mg twice daily - Continue spironolactone  50 mg daily - Continue Lasix  40 mg daily - Continue midodrine  for BP support while on diuretics  CKD stage IIIa: Renal function stable.  Continue to monitor.  Anemia of chronic disease and iron  deficiency: Hemoglobin stable at 11.8.  Continue to monitor.  Chronic thrombocytopenia: Mild in the setting of cirrhosis.  Continue to monitor.  Chronic pain syndrome: Patient is on chronic narcotic pain regimen for chronic back and abdominal pain.  Hold Norco and Nucynta tonight, resume as mentation improves.  Continue Bentyl  as needed.  Hypothyroidism: Continue Synthroid .  GAD: Continue venlafaxine   XR.  Restless leg syndrome: Continue  ropinirole .  Hyperlipidemia: Continue atorvastatin .   DVT prophylaxis: SCDs Start: 12/14/23 1955 Code Status:   Code Status: Do not attempt resuscitation (DNR) PRE-ARREST INTERVENTIONS DESIRED confirmed with patient's son on admission. Family Communication: Son at bedside Disposition Plan: From home and likely discharge to home pending clinical progress Consults called: None Severity of Illness: The appropriate patient status for this patient is OBSERVATION. Observation status is judged to be reasonable and necessary in order to provide the required intensity of service to ensure the patient's safety. The patient's presenting symptoms, physical exam findings, and initial radiographic and laboratory data in the context of their medical condition is felt to place them at decreased risk for further clinical deterioration. Furthermore, it is anticipated that the patient will be medically stable for discharge from the hospital within 2 midnights of admission.   Jorie Blanch MD [facility]  If 7PM-7AM, please contact night-coverage [email or web address]  12/14/2023, 8:10 PM

## 2023-12-14 NOTE — Hospital Course (Signed)
 [name] is a 68 y.o. female with medical history significant for NASH cirrhosis with recurrent hepatic encephalopathy, CKD stage IIIa, iron  deficiency anemia, chronic thrombocytopenia, hypothyroidism, GAD, RLS, chronic back and abdominal pain, short gut syndrome with chronic diarrhea who is admitted with hepatic encephalopathy.

## 2023-12-15 DIAGNOSIS — K7469 Other cirrhosis of liver: Secondary | ICD-10-CM | POA: Diagnosis not present

## 2023-12-15 DIAGNOSIS — K7581 Nonalcoholic steatohepatitis (NASH): Secondary | ICD-10-CM

## 2023-12-15 DIAGNOSIS — E039 Hypothyroidism, unspecified: Secondary | ICD-10-CM

## 2023-12-15 DIAGNOSIS — K7682 Hepatic encephalopathy: Secondary | ICD-10-CM | POA: Diagnosis not present

## 2023-12-15 LAB — CBC
HCT: 37.7 % (ref 36.0–46.0)
Hemoglobin: 12 g/dL (ref 12.0–15.0)
MCH: 31.7 pg (ref 26.0–34.0)
MCHC: 31.8 g/dL (ref 30.0–36.0)
MCV: 99.5 fL (ref 80.0–100.0)
Platelets: 159 K/uL (ref 150–400)
RBC: 3.79 MIL/uL — ABNORMAL LOW (ref 3.87–5.11)
RDW: 15.6 % — ABNORMAL HIGH (ref 11.5–15.5)
WBC: 7.5 K/uL (ref 4.0–10.5)
nRBC: 0 % (ref 0.0–0.2)

## 2023-12-15 LAB — COMPREHENSIVE METABOLIC PANEL WITH GFR
ALT: 18 U/L (ref 0–44)
AST: 30 U/L (ref 15–41)
Albumin: 2.9 g/dL — ABNORMAL LOW (ref 3.5–5.0)
Alkaline Phosphatase: 107 U/L (ref 38–126)
Anion gap: 9 (ref 5–15)
BUN: 20 mg/dL (ref 8–23)
CO2: 19 mmol/L — ABNORMAL LOW (ref 22–32)
Calcium: 8.9 mg/dL (ref 8.9–10.3)
Chloride: 116 mmol/L — ABNORMAL HIGH (ref 98–111)
Creatinine, Ser: 0.93 mg/dL (ref 0.44–1.00)
GFR, Estimated: >60 mL/min (ref >60)
Glucose, Bld: 94 mg/dL (ref 70–99)
Potassium: 3.6 mmol/L (ref 3.5–5.1)
Sodium: 143 mmol/L (ref 135–145)
Total Bilirubin: 1.3 mg/dL — ABNORMAL HIGH (ref 0.0–1.2)
Total Protein: 5.2 g/dL — ABNORMAL LOW (ref 6.5–8.1)

## 2023-12-15 LAB — PROTIME-INR
INR: 1.5 — ABNORMAL HIGH (ref 0.8–1.2)
Prothrombin Time: 19.3 s — ABNORMAL HIGH (ref 11.4–15.2)

## 2023-12-15 MED ORDER — MELATONIN 3 MG PO TABS: 3.0000 mg | ORAL_TABLET | Freq: Every evening | ORAL | Status: DC | PRN

## 2023-12-15 MED ORDER — METHOCARBAMOL 500 MG PO TABS
500.0000 mg | ORAL_TABLET | Freq: Every morning | ORAL | Status: DC
  Administered 2023-12-15 – 2023-12-18 (×4): 500 mg via ORAL
  Filled 2023-12-15 (×4): qty 1

## 2023-12-15 MED ORDER — HYDROCODONE-ACETAMINOPHEN 10-325 MG PO TABS
1.0000 | ORAL_TABLET | Freq: Four times a day (QID) | ORAL | Status: DC | PRN
  Administered 2023-12-15 – 2023-12-17 (×3): 1 via ORAL
  Filled 2023-12-15 (×3): qty 1

## 2023-12-15 MED ORDER — MEGESTROL ACETATE 40 MG PO TABS
40.0000 mg | ORAL_TABLET | Freq: Two times a day (BID) | ORAL | Status: DC
  Administered 2023-12-15 – 2023-12-18 (×7): 40 mg via ORAL
  Filled 2023-12-15 (×7): qty 1

## 2023-12-15 MED ORDER — COPPER 2 MG PO TABS
2.0000 mg | Freq: Two times a day (BID) | Status: DC
  Administered 2023-12-15 – 2023-12-18 (×7): 2 mg via ORAL
  Filled 2023-12-15 (×7): qty 1

## 2023-12-15 NOTE — Evaluation (Signed)
 Physical Therapy Evaluation Patient Details Name: [name] MRN: [id number] DOB: Jul 11, 1955 Today's Date: 12/15/2023  History of Present Illness  Patient is a 68 yo female presenting to the ED with AMS on 12/14/2023.  Admitted with hepatic encephalopathy and elevated ammonia levels.  Pt PMH includes but is not limited to:  decompensated cirrhosis, CKD IIIa, thrombocytopenia, hypothyroidism, CAD, RLS, chronic back and abdominal pain, short gut syndrome, iron  deficiency anemia, anemia of chronic disease, cirrhosis, and blind in L eye.  Clinical Impression    Pt admitted with above diagnosis.  Pt currently with functional limitations due to the deficits listed below (see PT Problem List). Pt seated on [facility], son assisted with hygiene and transfer to EOB with PT returning to room and pt seated EOB. Pt required CGA for sit to stand  from EOB, CGA for gait tasks with RW, min cues and decreased cadence for 110 feet in hallway, CGA for supine to sit to flat bed with min cues. Pt left in bed, all needs in place, son present. Pt is motivated to be medically stable, participate with mobility tasks and d/c home. Pt will benefit from acute skilled PT to increase their independence and safety with mobility to allow discharge.         If plan is discharge home, recommend the following: A little help with walking and/or transfers;A little help with bathing/dressing/bathroom;Assistance with cooking/housework;Assist for transportation;Help with stairs or ramp for entrance   Can travel by private vehicle        Equipment Recommendations None recommended by PT  Recommendations for Other Services       Functional Status Assessment Patient has had a recent decline in their functional status and demonstrates the ability to make significant improvements in function in a reasonable and predictable amount of time.     Precautions / Restrictions Precautions Precautions: Fall Restrictions Weight Bearing  Restrictions Per Provider Order: No      Mobility  Bed Mobility Overal bed mobility: Needs Assistance Bed Mobility: Sit to Supine       Sit to supine: Contact guard assist   General bed mobility comments: PT arrived and pt seated BSC, son assisted with transfer and hygiene and PT returned to room and pt seated EOB, CGA for sit to supine with min cues    Transfers Overall transfer level: Needs assistance Equipment used: Rolling walker (2 wheels) Transfers: Sit to/from Stand Sit to Stand: Contact guard assist           General transfer comment: min cues    Ambulation/Gait Ambulation/Gait assistance: Contact guard assist Gait Distance (Feet): 110 Feet Assistive device: Rolling walker (2 wheels) Gait Pattern/deviations: Step-to pattern, Decreased step length - right, Decreased step length - left, Wide base of support Gait velocity: decreased     General Gait Details: slight trunk flexion and lateral sway with wide BOS, pt motivated to ambulate, no overt LOB, no reports of increased pain nor SOB with mobiltiy tasks  Stairs            Wheelchair Mobility     Tilt Bed    Modified Rankin (Stroke Patients Only)       Balance Overall balance assessment: Mild deficits observed, not formally tested                                           Pertinent Vitals/Pain Pain Assessment Pain  Assessment: No/denies pain (hx of chronic pain, no pain report at eval)    Home Living Family/patient expects to be discharged to:: Private residence Living Arrangements: Children Available Help at Discharge: Family;Available 24 hours/day Type of Home: House Home Access: Ramped entrance       Home Layout: One level Home Equipment: Rollator (4 wheels);Rolling Walker (2 wheels);BSC/3in1;Other (comment);Hospital bed;Wheelchair - manual;Adaptive equipment;Hand held shower head Additional Comments: Pt lives with son [name] - very supportive, provides assist as  needed; [name] present bedside for evaluation and education    Prior Function Prior Level of Function : Needs assist       Physical Assist : ADLs (physical);Mobility (physical)     Mobility Comments: son typically present for all mobility; uses RW in home, manual WC outside home, son reports increased IND no AD for short amb bouts with HHA and pt is able to tolerate increased gait distances in home ADLs Comments: Son has been assisting with ADLs lately. Pt states she was doing her own ADL tasks until recently     Extremity/Trunk Assessment        Lower Extremity Assessment Lower Extremity Assessment: Generalized weakness    Cervical / Trunk Assessment Cervical / Trunk Assessment: Kyphotic  Communication   Communication Communication: No apparent difficulties    Cognition Arousal: Alert Behavior During Therapy: WFL for tasks assessed/performed   PT - Cognitive impairments: No apparent impairments                         Following commands: Intact       Cueing       General Comments      Exercises     Assessment/Plan    PT Assessment Patient needs continued PT services  PT Problem List Decreased activity tolerance;Decreased balance;Decreased mobility       PT Treatment Interventions DME instruction;Gait training;Functional mobility training;Therapeutic activities;Therapeutic exercise;Balance training;Neuromuscular re-education;Patient/family education    PT Goals (Current goals can be found in the Care Plan section)  Acute Rehab PT Goals Patient Stated Goal: to not have to be in the hospital long this time PT Goal Formulation: With patient Time For Goal Achievement: 12/29/23 Potential to Achieve Goals: Good    Frequency Min 3X/week     Co-evaluation               AM-PAC PT 6 Clicks Mobility  Outcome Measure Help needed turning from your back to your side while in a flat bed without using bedrails?: A Little Help needed moving from  lying on your back to sitting on the side of a flat bed without using bedrails?: A Little Help needed moving to and from a bed to a chair (including a wheelchair)?: A Little Help needed standing up from a chair using your arms (e.g., wheelchair or bedside chair)?: A Little Help needed to walk in hospital room?: A Little Help needed climbing 3-5 steps with a railing? : Total 6 Click Score: 16    End of Session Equipment Utilized During Treatment: Gait belt Activity Tolerance: Patient tolerated treatment well Patient left: in bed;with call bell/phone within reach;with family/visitor present Nurse Communication: Mobility status PT Visit Diagnosis: Unsteadiness on feet (R26.81);Other abnormalities of gait and mobility (R26.89);Muscle weakness (generalized) (M62.81);Difficulty in walking, not elsewhere classified (R26.2)    Time: 1202-1217 PT Time Calculation (min) (ACUTE ONLY): 15 min   Charges:   PT Evaluation $PT Eval Low Complexity: 1 Low   PT General Charges $$ ACUTE  PT VISIT: 1 Visit         [place], PT Acute Rehab   [place] VEAR Drone 12/15/2023, 1:30 PM

## 2023-12-15 NOTE — TOC Initial Note (Signed)
 Transition of Care [facility]) - Initial/Assessment Note    Patient Details  Name: [name] MRN: [id number] Date of Birth: Aug 24, 1955  Transition of [facility]) CM/SW Contact:    Sonda Manuella Quill, RN Phone Number: 12/15/2023, 5:31 PM  Clinical Narrative:                 Orders received for [facility] services; spoke w/ pt and son Nasim Garofano [phone]) in room; pt lives at home w/ her son; she plans to return w/ his support and [facility] services at d/c; he will provide transportation; insurance/PCP verified; pt has cane, walker, wheelchair, BSC, shower chair, lift recliner, hospital bed, hoyer, scooter, hover round; she had HHPT/OT/Aide w/ [facility]; pt does not have home oxygen; they agree to receive [facility] services; her son requested agency other than [facility]; faxed out in hub; awaiting offers; IP CM is following.  Expected Discharge Plan: Home w Home Health Services Barriers to Discharge: Continued Medical Work up   Patient Goals and CMS Choice Patient states their goals for this hospitalization and ongoing recovery are:: home     Alba ownership interest in [facility].provided to:: Adult Children    Expected Discharge Plan and Services   Discharge Planning Services: CM Consult   Living arrangements for the past 2 months: Single Family Home                 DME Arranged: N/A DME Agency: NA                  Prior Living Arrangements/Services Living arrangements for the past 2 months: Single Family Home Lives with:: Adult Children Patient language and need for interpreter reviewed:: Yes Do you feel safe going back to the place where you live?: Yes      Need for Family Participation in Patient Care: Yes (Comment) Care giver support system in place?: Yes (comment) Current home services: DME, Homehealth aide, Home OT, Home PT (cane, walker, wheelchair, BSC, shower chair, lift recliner, hospital bed, hoyer, scooter, hover round; HHPT/OT/Aide) Criminal Activity/Legal  Involvement Pertinent to Current Situation/Hospitalization: No - Comment as needed  Activities of Daily Living   ADL Screening (condition at time of admission) Independently performs ADLs?: Yes (appropriate for developmental age) Is the patient deaf or have difficulty hearing?: No Does the patient have difficulty seeing, even when wearing glasses/contacts?: No Does the patient have difficulty concentrating, remembering, or making decisions?: No  Permission Sought/Granted Permission sought to share information with : Case Manager Permission granted to share information with : Yes, Verbal Permission Granted  Share Information with NAME: Case Manager     Permission granted to share info w Relationship: Amahia Madonia (son) [phone]     Emotional Assessment Appearance:: Appears stated age Attitude/Demeanor/Rapport: Gracious Affect (typically observed): Accepting Orientation: : Oriented to Self, Oriented to Place, Oriented to  Time, Oriented to Situation Alcohol / Substance Use: Not Applicable Psych Involvement: No (comment)  Admission diagnosis:  Hepatic encephalopathy (HCC) [K76.82] Patient Active Problem List   Diagnosis Date Noted   Staphylococcus epidermidis bacteremia 11/02/2023   Normal anion gap metabolic acidosis 10/24/2023   AMS (altered mental status) 10/08/2023   Arthralgia of left knee 09/08/2023   Acute gout 08/28/2023   Knee pain 07/03/2023   Hepatic encephalopathy (HCC) 06/29/2023   Chronic kidney disease, stage 3a (HCC) 06/28/2023   Hyperammonemia 05/23/2023   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) 05/23/2023   Chronic idiopathic thrombocytopenia (HCC) 05/23/2023   Hyperlipidemia 05/23/2023   Generalized anxiety  disorder 05/23/2023   Chronic anemia 05/23/2023   Acute metabolic encephalopathy 05/23/2023   Copper  deficiency 04/24/2023   Depression 03/30/2023   Restless leg syndrome 03/30/2023   Pruritus 03/06/2023   Edema of both lower legs 03/06/2023    Insomnia 03/06/2023   Compression fracture of body of thoracic vertebra (HCC) 03/06/2023   Anemia of chronic disease 02/22/2023   Lumbar radiculopathy 12/10/2022   Grade I diastolic dysfunction 12/10/2022   History of DVT (deep vein thrombosis) 12/01/2022   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   Acute hepatic encephalopathy (HCC) 10/17/2022   DNR (do not resuscitate)/DNI(Do Not intubate) 10/05/2022   Vaginal bleeding 10/05/2022   Physical deconditioning 08/17/2022   Decompensated cirrhosis (HCC) 08/17/2022   Osteoarthritis of right knee 08/01/2022   Folate deficiency 06/14/2022   Systolic murmur 06/12/2022   Thrombocytopenia (HCC) - Baseline 100-130K 05/16/2022   History of eye surgery 05/01/2022   Corneal abnormality 04/29/2022   Status post eye surgery 04/29/2022   Corneal perforation of left eye 04/18/2022   Pseudophakia of left eye 02/08/2022   Physical debility 12/10/2021   Liver cirrhosis secondary to NASH (HCC) 10/03/2021   Polypharmacy 06/22/2021   Former smoker 01/06/2021   Combined form of age-related cataract, right eye 10/01/2020   Corneal scarring 09/10/2020   Cerebral atherosclerosis 08/20/2020   Short gut syndrome 08/20/2020   DDD (degenerative disc disease), lumbar 08/20/2020   Hernia, ventral 03/06/2020   Splenomegaly 02/06/2019   Vitamin B12 deficiency 01/21/2019   GERD (gastroesophageal reflux disease) 07/25/2016   Hypothyroidism 07/25/2016   Iron  deficiency anemia 05/09/2016   Microcytic anemia 04/12/2016   Vitamin D  deficiency 05/04/2015   History of adenomatous polyp of colon 04/28/2015   H/O angioedema 11/15/2014   Obesity 07/25/2014   Chronic diarrhea 06/01/2014   PCP:  [name], MD Pharmacy:   [facility] [phone] - [place], [place] - [address] AT [facility] [address] [place] [address] Phone: [phone] Fax: [phone]     Social Drivers of Health (SDOH) Social History: SDOH Screenings    Food Insecurity: No Food Insecurity (12/15/2023)  Housing: Low Risk  (12/15/2023)  Transportation Needs: No Transportation Needs (12/15/2023)  Utilities: Not At Risk (12/15/2023)  Alcohol Screen: Low Risk  (09/20/2023)  Depression (PHQ2-9): Low Risk  (11/07/2023)  Recent Concern: Depression (PHQ2-9) - Medium Risk (09/20/2023)  Financial Resource Strain: Low Risk  (09/20/2023)  Physical Activity: Inactive (09/20/2023)  Social Connections: Socially Isolated (12/14/2023)  Stress: No Stress Concern Present (09/20/2023)  Tobacco Use: Medium Risk (12/14/2023)  Health Literacy: Adequate Health Literacy (09/20/2023)   SDOH Interventions: Food Insecurity Interventions: Intervention Not Indicated, Inpatient TOC Housing Interventions: Intervention Not Indicated, Inpatient TOC Transportation Interventions: Intervention Not Indicated, Inpatient TOC Utilities Interventions: Intervention Not Indicated, Inpatient TOC   Readmission Risk Interventions    10/31/2023    8:35 AM 10/10/2023    2:52 PM 08/25/2023   10:04 AM  Readmission Risk Prevention Plan  Transportation Screening Complete Complete Complete  Medication Review Oceanographer) Complete Complete Complete  PCP or Specialist appointment within 3-5 days of discharge  Complete   HRI or Home Care Consult Complete Complete Complete  SW Recovery Care/Counseling Consult Complete Complete Complete  Palliative Care Screening Not Applicable Not Applicable Not Applicable  Skilled Nursing Facility Not Applicable Not Applicable Not Applicable

## 2023-12-15 NOTE — Care Management Obs Status (Signed)
 MEDICARE OBSERVATION STATUS NOTIFICATION   Patient Details  Name: Glendale Wherry MRN: [id number] Date of Birth: October 06, 1955   Medicare Observation Status Notification Given:  Yes    Sonda Manuella Quill, RN 12/15/2023, 2:42 PM

## 2023-12-15 NOTE — Evaluation (Signed)
 Occupational Therapy Evaluation Patient Details Name: Genice Kimberlin MRN: [id number] DOB: 09/08/1955 Today's Date: 12/15/2023   History of Present Illness   Patient is a 68 yo female presenting to the ED with AMS on 12/14/2023.  Admitted with hepatic encephalopathy and elevated ammonia levels.  Pt PMH includes but is not limited to:  decompensated cirrhosis, CKD IIIa, thrombocytopenia, hypothyroidism, CAD, RLS, chronic back and abdominal pain, short gut syndrome, iron  deficiency anemia, anemia of chronic disease, cirrhosis, and blind in L eye.     Clinical Impressions Pt admitted with above diagnosis. Pt received with son present, son reports he has been progressing mobility as able with HHA around the house and assisting pt with ADLs intermittently. Pt received on [facility] with son present, son assisting with hygiene and pt ready for OT session after completion. Pt motivated to participate and was pleased with her abilities end of session despite fatigue. Pt requires CGA for bed mobility and functional transfers/mobility, and able to walk in hallway ~100 ft using RW. OT will continue to follow acutely, do not anticipate OT needs following hospitalization.      If plan is discharge home, recommend the following:   A little help with walking and/or transfers;A little help with bathing/dressing/bathroom;Assist for transportation;Help with stairs or ramp for entrance;Supervision due to cognitive status     Functional Status Assessment   Patient has had a recent decline in their functional status and demonstrates the ability to make significant improvements in function in a reasonable and predictable amount of time.     Equipment Recommendations   None recommended by OT      Precautions/Restrictions   Precautions Precautions: Fall Restrictions Weight Bearing Restrictions Per Provider Order: No     Mobility Bed Mobility Overal bed mobility: Needs Assistance Bed Mobility: Sit to  Supine       Sit to supine: Contact guard assist        Transfers Overall transfer level: Needs assistance Equipment used: Rolling walker (2 wheels) Transfers: Sit to/from Stand Sit to Stand: Contact guard assist           General transfer comment: min cues      Balance Overall balance assessment: Mild deficits observed, not formally tested                                         ADL either performed or assessed with clinical judgement   ADL Overall ADL's : Needs assistance/impaired Eating/Feeding: Independent;Sitting   Grooming: Set up;Sitting   Upper Body Bathing: Sitting;Minimal assistance   Lower Body Bathing: Sitting/lateral leans;Minimal assistance   Upper Body Dressing : Sitting;Minimal assistance;With caregiver independent assisting Upper Body Dressing Details (indicate cue type and reason): don/doff gown (son assists)     Toilet Transfer: With caregiver independent assisting;Supervision/safety;BSC/3in1 Statistician Details (indicate cue type and reason): pt recieved on [facility] (son assisting with transfer), returned and son was back in bed Toileting- Clothing Manipulation and Hygiene: Maximal assistance;Sitting/lateral lean;Sit to/from stand       Functional mobility during ADLs: Rolling walker (2 wheels);Contact guard assist General ADL Comments: functional mobility ~100 ft in hallway with RW, pt pleased with progress as she walked same distance earlier with PT     Vision Baseline Vision/History: 2 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) Ability to See in Adequate Light: 2 Moderately impaired  Pertinent Vitals/Pain Pain Assessment Pain Assessment: No/denies pain     Extremity/Trunk Assessment Upper Extremity Assessment Upper Extremity Assessment: Generalized weakness   Lower Extremity Assessment Lower Extremity Assessment: Generalized weakness   Cervical / Trunk Assessment Cervical / Trunk Assessment: Kyphotic    Communication Communication Communication: No apparent difficulties   Cognition Arousal: Alert Behavior During Therapy: WFL for tasks assessed/performed                                 Following commands: Intact       Cueing  General Comments   Cueing Techniques: Verbal cues  VSS on RA. Mild fatigue with activity.   Exercises     Shoulder Instructions      Home Living Family/patient expects to be discharged to:: Private residence Living Arrangements: Children Available Help at Discharge: Family;Available 24 hours/day Type of Home: House Home Access: Ramped entrance     Home Layout: One level     Bathroom Shower/Tub: Walk-in shower;Sponge bathes at baseline   Bathroom Toilet: Standard Bathroom Accessibility: Yes How Accessible: Accessible via walker Home Equipment: Rollator (4 wheels);Rolling Walker (2 wheels);BSC/3in1;Other (comment);Hospital bed;Wheelchair - manual;Adaptive equipment;Hand held Clinical Biochemist: Ship Broker Additional Comments: Pt lives with son [name] - very supportive, provides assist as needed; [name] present bedside for evaluation and education      Prior Functioning/Environment Prior Level of Function : Needs assist       Physical Assist : ADLs (physical);Mobility (physical)     Mobility Comments: son typically present for all mobility; uses RW in home, manual WC outside home, son reports increased IND no AD for short amb bouts with HHA and pt is able to tolerate increased gait distances in home ADLs Comments: Son has been assisting with ADLs lately. Pt states she was doing her own ADL tasks until recently    OT Problem List: Decreased activity tolerance;Decreased strength;Impaired balance (sitting and/or standing);Cardiopulmonary status limiting activity   OT Treatment/Interventions: Self-care/ADL training;Therapeutic exercise;Energy conservation;DME and/or AE instruction;Therapeutic  activities;Patient/family education;Balance training      OT Goals(Current goals can be found in the care plan section)   Acute Rehab OT Goals OT Goal Formulation: With patient/family Time For Goal Achievement: 12/29/23 Potential to Achieve Goals: Good   OT Frequency:  Min 2X/week    Co-evaluation              AM-PAC OT 6 Clicks Daily Activity     Outcome Measure Help from another person eating meals?: None Help from another person taking care of personal grooming?: None Help from another person toileting, which includes using toliet, bedpan, or urinal?: A Little Help from another person bathing (including washing, rinsing, drying)?: A Little Help from another person to put on and taking off regular upper body clothing?: A Little Help from another person to put on and taking off regular lower body clothing?: A Little 6 Click Score: 20   End of Session Equipment Utilized During Treatment: Rolling walker (2 wheels);Gait belt Nurse Communication: Mobility status  Activity Tolerance: Patient tolerated treatment well;No increased pain Patient left: in bed;with call bell/phone within reach;with family/visitor present;with bed alarm set  OT Visit Diagnosis: Muscle weakness (generalized) (M62.81);Unsteadiness on feet (R26.81)                Time: 8598-8578 OT Time Calculation (min): 20 min Charges:  OT General Charges $OT Visit: 1 Visit OT Evaluation $OT Eval Low Complexity: 1  Low  Lucette Kratz L. Aram Domzalski, OTR/L  12/15/23, 2:42 PM

## 2023-12-15 NOTE — Progress Notes (Signed)
 [facility] PROGRESS NOTE   Teva Bronkema FMW:[id number] DOB: 12/17/1955 DOA: 12/14/2023  PCP: [name], MD  Brief History: 68 y.o. female with medical history significant for NASH cirrhosis with recurrent hepatic encephalopathy, CKD stage IIIa, iron  deficiency anemia, chronic thrombocytopenia, hypothyroidism, GAD, RLS, chronic back and abdominal pain, short gut syndrome with chronic diarrhea who presented to the ED for evaluation of altered mental status.  Patient was found to have elevated ammonia level.  Patient has been compliant with her lactulose .  However instead of taking lactulose  3 times a day patient had been taking it twice a day after discussing with the GI specialist.  Patient is followed by gastroenterology in [facility]. She is being evaluated for possible Wilson's disease as she was noted to have low ceruloplasmin and serum copper  levels.  Consultants: None  Procedures: None    Subjective/Interval History: Patient noted to be awake alert this morning.  Complains of inability to fall asleep.  Son is at the bedside.  Patient denies any nausea vomiting abdominal pain.    Assessment/Plan:  Acute hepatic encephalopathy in the setting of Nash cirrhosis/thrombocytopenia Patient is followed by gastroenterology at [facility]. Ammonia level was 150 on admission.  Patient was started on lactulose  4 times a day.  Rifaximin  was continued as well. Mentation appears to have improved.  No asterixis noted this morning.  Will check ammonia level tomorrow morning.  Does not appear that this was ordered for this morning. Patient is also on furosemide  and spironolactone  which has been continued.  Patient is also on midodrine  for blood pressure support.  Chronic kidney disease stage IIIa Stable.  Monitor urine output.  Avoid nephrotoxic agents.  Anemia of chronic disease Stable hemoglobin.  No evidence of overt bleeding.  Hypothyroidism Continue levothyroxine .  Chronic  pain syndrome Patient is on narcotics chronically for her chronic back pain and abdominal pain.  Restless leg syndrome Continue ropinirole .  Generalized anxiety disorder Noted to be on Effexor .  Obesity Estimated body mass index is 37.31 kg/m as calculated from the following:   Height as of this encounter: 5' 2 (1.575 m).   Weight as of this encounter: 92.5 kg.   DVT Prophylaxis: SCDs Code Status: DNR Family Communication: Discussed with her son Disposition Plan: PT OT eval  Status is: Observation The patient will require care spanning > 2 midnights and should be moved to inpatient because: Hepatic encephalopathy      Medications: Scheduled:  atorvastatin   40 mg Oral Daily   furosemide   40 mg Oral Daily   lactulose   30 g Oral QID   levothyroxine   175 mcg Oral Q0600   midodrine   10 mg Oral TID with meals   pantoprazole   40 mg Oral BID AC   potassium chloride   20 mEq Oral BID   rifaximin   550 mg Oral BID   rOPINIRole   0.25 mg Oral QHS   spironolactone   50 mg Oral Daily   venlafaxine  XR  75 mg Oral Q breakfast   Continuous: PRN:albuterol , dicyclomine , melatonin, prochlorperazine , senna-docusate  Antibiotics: Anti-infectives (From admission, onward)    Start     Dose/Rate Route Frequency Ordered Stop   12/14/23 2200  rifaximin  (XIFAXAN ) tablet 550 mg        550 mg Oral 2 times daily 12/14/23 2001         Objective:  Vital Signs  Vitals:   12/14/23 1726 12/14/23 2100 12/15/23 0100 12/15/23 0543  BP: 124/71 (!) 141/74 134/80 137/71  Pulse: 78 78  97 83  Resp: 18 18 20 20   Temp: 98.3 F (36.8 C) 98 F (36.7 C) 98 F (36.7 C) 98.2 F (36.8 C)  TempSrc: Oral Oral Oral Oral  SpO2: 100% 99% 100% 100%  Weight:      Height:        Intake/Output Summary (Last 24 hours) at 12/15/2023 0947 Last data filed at 12/15/2023 0601 Gross per 24 hour  Intake --  Output 1 ml  Net -1 ml   Filed Weights   12/14/23 1403  Weight: 92.5 kg    General appearance:  Awake alert.  In no distress.  Somewhat distracted Resp: Clear to auscultation bilaterally.  Normal effort Cardio: S1-S2 is normal regular.  No S3-S4.  No rubs murmurs or bruit GI: Abdomen is soft.  Nontender nondistended.  Bowel sounds are present normal.  No masses organomegaly Extremities: No edema.  Moving her extremities but physical deconditioning is noted Neurologic: Oriented to person.  No focal neurological deficits.    Lab Results:  Data Reviewed: I have personally reviewed following labs and reports of the imaging studies  CBC: Recent Labs  Lab 12/14/23 1432 12/15/23 0913  WBC 7.6 7.5  NEUTROABS 4.9  --   HGB 11.8* 12.0  HCT 41.1 37.7  MCV 109.3* 99.5  PLT 137* 159    Basic Metabolic Panel: Recent Labs  Lab 12/14/23 1432 12/15/23 0655  NA 139 143  K 3.6 3.6  CL 111 116*  CO2 18* 19*  GLUCOSE 142* 94  BUN 23 20  CREATININE 1.16* 0.93  CALCIUM  8.7* 8.9    GFR: Estimated Creatinine Clearance: 63 mL/min (by C-G formula based on SCr of 0.93 mg/dL).  Liver Function Tests: Recent Labs  Lab 12/14/23 1432 12/15/23 0655  AST 33 30  ALT 20 18  ALKPHOS 126 107  BILITOT 1.3* 1.3*  PROT 5.8* 5.2*  ALBUMIN  3.1* 2.9*    Recent Labs  Lab 12/14/23 1432  AMMONIA 150*    Coagulation Profile: Recent Labs  Lab 12/15/23 0655  INR 1.5*    CBG: Recent Labs  Lab 12/14/23 1432  GLUCAP 131*     Recent Results (from the past 240 hours)  Resp panel by RT-PCR (RSV, Flu A&B, Covid) Anterior Nasal Swab     Status: None   Collection Time: 12/14/23  2:32 PM   Specimen: Anterior Nasal Swab  Result Value Ref Range Status   SARS Coronavirus 2 by RT PCR NEGATIVE NEGATIVE Final    Comment: (NOTE) SARS-CoV-2 target nucleic acids are NOT DETECTED.  The SARS-CoV-2 RNA is generally detectable in upper respiratory specimens during the acute phase of infection. The lowest concentration of SARS-CoV-2 viral copies this assay can detect is 138 copies/mL. A negative  result does not preclude SARS-Cov-2 infection and should not be used as the sole basis for treatment or other patient management decisions. A negative result may occur with  improper specimen collection/handling, submission of specimen other than nasopharyngeal swab, presence of viral mutation(s) within the areas targeted by this assay, and inadequate number of viral copies(<138 copies/mL). A negative result must be combined with clinical observations, patient history, and epidemiological information. The expected result is Negative.  Fact Sheet for Patients:  bloggercourse.com  Fact Sheet for Healthcare Providers:  seriousbroker.it  This test is no t yet approved or cleared by the [place]  FDA and  has been authorized for detection and/or diagnosis of SARS-CoV-2 by FDA under an Emergency Use Authorization (EUA). This EUA will remain  in effect (meaning this test can be used) for the duration of the COVID-19 declaration under Section 564(b)(1) of the Act, 21 U.S.C.section 360bbb-3(b)(1), unless the authorization is terminated  or revoked sooner.       Influenza A by PCR NEGATIVE NEGATIVE Final   Influenza B by PCR NEGATIVE NEGATIVE Final    Comment: (NOTE) The Xpert Xpress SARS-CoV-2/FLU/RSV plus assay is intended as an aid in the diagnosis of influenza from Nasopharyngeal swab specimens and should not be used as a sole basis for treatment. Nasal washings and aspirates are unacceptable for Xpert Xpress SARS-CoV-2/FLU/RSV testing.  Fact Sheet for Patients: bloggercourse.com  Fact Sheet for Healthcare Providers: seriousbroker.it  This test is not yet approved or cleared by the [place]  FDA and has been authorized for detection and/or diagnosis of SARS-CoV-2 by FDA under an Emergency Use Authorization (EUA). This EUA will remain in effect (meaning this test can be used)  for the duration of the COVID-19 declaration under Section 564(b)(1) of the Act, 21 U.S.C. section 360bbb-3(b)(1), unless the authorization is terminated or revoked.     Resp Syncytial Virus by PCR NEGATIVE NEGATIVE Final    Comment: (NOTE) Fact Sheet for Patients: bloggercourse.com  Fact Sheet for Healthcare Providers: seriousbroker.it  This test is not yet approved or cleared by the [place]  FDA and has been authorized for detection and/or diagnosis of SARS-CoV-2 by FDA under an Emergency Use Authorization (EUA). This EUA will remain in effect (meaning this test can be used) for the duration of the COVID-19 declaration under Section 564(b)(1) of the Act, 21 U.S.C. section 360bbb-3(b)(1), unless the authorization is terminated or revoked.  Performed at [facility], [address] [place], [place] [address]       Radiology Studies: CT Head Wo Contrast Result Date: 12/14/2023 EXAM: CT HEAD WITHOUT CONTRAST 12/14/2023 04:53:00 PM TECHNIQUE: CT of the head was performed without the administration of intravenous contrast. Automated exposure control, iterative reconstruction, and/or weight based adjustment of the mA/kV was utilized to reduce the radiation dose to as low as reasonably achievable. COMPARISON: Head CT 10/29/2023 and MRI 10/17/2022. CLINICAL HISTORY: Mental status change, unknown cause. FINDINGS: BRAIN AND VENTRICLES: There is no evidence of an acute infarct, intracranial hemorrhage, mass, midline shift, hydrocephalus, or extra-axial fluid collection. Cerebral volume is within normal limits for age. Patchy hypodensities in the cerebral white matter are similar to the prior CT and nonspecific but compatible with moderate chronic small vessel ischemic disease. Calcified atherosclerosis at the skull base. ORBITS: Left cataract extraction. SINUSES: No acute abnormality. SOFT TISSUES AND SKULL: No acute soft  tissue abnormality. No skull fracture. IMPRESSION: 1. No acute intracranial abnormality. 2. Moderate chronic small vessel ischemic disease. Electronically signed by: Dasie Hamburg MD 12/14/2023 05:45 PM EST RP Workstation: [id number]   DG Chest Portable 1 View Result Date: 12/14/2023 CLINICAL DATA:  Altered mental status EXAM: PORTABLE CHEST 1 VIEW COMPARISON:  Prior chest x-ray 10/29/2023 FINDINGS: The heart size and mediastinal contours are within normal limits. Both lungs are clear save for mild chronic bronchitic changes. The visualized skeletal structures are unremarkable. Atherosclerotic vascular calcifications present in the transverse aorta. IMPRESSION: No active disease. Electronically Signed   By: Wilkie Lent M.D.   On: 12/14/2023 15:39       LOS: 0 days   Imanie Darrow  [facility] Pager on [email or web address]  12/15/2023, 9:47 AM

## 2023-12-16 DIAGNOSIS — E039 Hypothyroidism, unspecified: Secondary | ICD-10-CM | POA: Diagnosis not present

## 2023-12-16 DIAGNOSIS — K7581 Nonalcoholic steatohepatitis (NASH): Secondary | ICD-10-CM | POA: Diagnosis not present

## 2023-12-16 DIAGNOSIS — K7469 Other cirrhosis of liver: Secondary | ICD-10-CM | POA: Diagnosis not present

## 2023-12-16 DIAGNOSIS — K7682 Hepatic encephalopathy: Secondary | ICD-10-CM | POA: Diagnosis not present

## 2023-12-16 LAB — COMPREHENSIVE METABOLIC PANEL WITH GFR
ALT: 19 U/L (ref 0–44)
AST: 35 U/L (ref 15–41)
Albumin: 3.1 g/dL — ABNORMAL LOW (ref 3.5–5.0)
Alkaline Phosphatase: 107 U/L (ref 38–126)
Anion gap: 9 (ref 5–15)
BUN: 21 mg/dL (ref 8–23)
CO2: 17 mmol/L — ABNORMAL LOW (ref 22–32)
Calcium: 9 mg/dL (ref 8.9–10.3)
Chloride: 118 mmol/L — ABNORMAL HIGH (ref 98–111)
Creatinine, Ser: 0.96 mg/dL (ref 0.44–1.00)
GFR, Estimated: >60 mL/min (ref >60)
Glucose, Bld: 118 mg/dL — ABNORMAL HIGH (ref 70–99)
Potassium: 4.2 mmol/L (ref 3.5–5.1)
Sodium: 144 mmol/L (ref 135–145)
Total Bilirubin: 1.6 mg/dL — ABNORMAL HIGH (ref 0.0–1.2)
Total Protein: 5.7 g/dL — ABNORMAL LOW (ref 6.5–8.1)

## 2023-12-16 LAB — CBC
HCT: 38.8 % (ref 36.0–46.0)
Hemoglobin: 11.8 g/dL — ABNORMAL LOW (ref 12.0–15.0)
MCH: 31.1 pg (ref 26.0–34.0)
MCHC: 30.4 g/dL (ref 30.0–36.0)
MCV: 102.1 fL — ABNORMAL HIGH (ref 80.0–100.0)
Platelets: 159 K/uL (ref 150–400)
RBC: 3.8 MIL/uL — ABNORMAL LOW (ref 3.87–5.11)
RDW: 15.8 % — ABNORMAL HIGH (ref 11.5–15.5)
WBC: 7.9 K/uL (ref 4.0–10.5)
nRBC: 0 % (ref 0.0–0.2)

## 2023-12-16 LAB — AMMONIA: Ammonia: 96 umol/L — ABNORMAL HIGH (ref 9–35)

## 2023-12-16 NOTE — Progress Notes (Signed)
 [facility] PROGRESS NOTE   [name] FMW:[id number] DOB: 1955/09/20 DOA: 12/14/2023  PCP: [name], MD  Brief History: 68 y.o. female with medical history significant for NASH cirrhosis with recurrent hepatic encephalopathy, CKD stage IIIa, iron  deficiency anemia, chronic thrombocytopenia, hypothyroidism, GAD, RLS, chronic back and abdominal pain, short gut syndrome with chronic diarrhea who presented to the ED for evaluation of altered mental status.  Patient was found to have elevated ammonia level.  Patient has been compliant with her lactulose .  However instead of taking lactulose  3 times a day patient had been taking it twice a day after discussing with the GI specialist.  Patient is followed by gastroenterology in [facility]. She is being evaluated for possible Wilson's disease as she was noted to have low ceruloplasmin and serum copper  levels.  Consultants: None  Procedures: None   Subjective/Interval History: Patient noted to be much more awake and alert though still confused.  According to son she is not back to baseline.  Patient denies any abdominal pain nausea vomiting.  She is currently on a bedpan trying to have a bowel movement.     Assessment/Plan:  Acute hepatic encephalopathy in the setting of Nash cirrhosis/thrombocytopenia Patient is followed by gastroenterology at [facility]. Ammonia level was 150 on admission.  Patient was started on lactulose  4 times a day.  Rifaximin  was continued as well.  According to patient's son her baseline ammonia level is around 60. Ammonia level has improved and is noted to be 96 this morning.  Continue current treatment.  Recheck ammonia levels in the morning. Mentation has improved.  No asterixis noted. Patient is also on furosemide  and spironolactone  which has been continued.  Patient is also on midodrine  for blood pressure support.  Chronic kidney disease stage IIIa Stable.  Monitor urine output.  Avoid  nephrotoxic agents.  Anemia of chronic disease Stable hemoglobin.  No evidence of overt bleeding.  Hypothyroidism Continue levothyroxine .  Chronic pain syndrome Patient is on narcotics chronically for her chronic back pain and abdominal pain.  Restless leg syndrome Continue ropinirole .  Generalized anxiety disorder Noted to be on Effexor .  Obesity Estimated body mass index is 39.44 kg/m as calculated from the following:   Height as of this encounter: 5' 2 (1.575 m).   Weight as of this encounter: 97.8 kg.   DVT Prophylaxis: SCDs Code Status: DNR Family Communication: Discussed with her son Disposition Plan: Home health recommended by PT.  Await further improvement in mentation.    Medications: Scheduled:  atorvastatin   40 mg Oral Daily   copper   2 mg Oral BID   furosemide   40 mg Oral Daily   lactulose   30 g Oral QID   levothyroxine   175 mcg Oral Q0600   megestrol   40 mg Oral BID   methocarbamol   500 mg Oral q AM   midodrine   10 mg Oral TID with meals   pantoprazole   40 mg Oral BID AC   potassium chloride   20 mEq Oral BID   rifaximin   550 mg Oral BID   rOPINIRole   0.25 mg Oral QHS   spironolactone   50 mg Oral Daily   venlafaxine  XR  75 mg Oral Q breakfast   Continuous: PRN:albuterol , dicyclomine , HYDROcodone -acetaminophen , melatonin, prochlorperazine , senna-docusate  Antibiotics: Anti-infectives (From admission, onward)    Start     Dose/Rate Route Frequency Ordered Stop   12/14/23 2200  rifaximin  (XIFAXAN ) tablet 550 mg        550 mg Oral 2 times daily  12/14/23 2001         Objective:  Vital Signs  Vitals:   12/15/23 0543 12/15/23 0709 12/15/23 1022 12/15/23 2029  BP: 137/71  120/66 (!) 120/57  Pulse: 83  79 68  Resp: 20  16 19   Temp: 98.2 F (36.8 C)  98.1 F (36.7 C) 97.8 F (36.6 C)  TempSrc: Oral   Oral  SpO2: 100%  100% 100%  Weight:  97.8 kg    Height:       No intake or output data in the 24 hours ending 12/16/23 1038  Filed  Weights   12/14/23 1403 12/15/23 0709  Weight: 92.5 kg 97.8 kg    General appearance: Awake alert.  No distress Resp: Clear to auscultation bilaterally.  Normal effort Cardio: S1-S2 is normal regular.  No S3-S4.  No rubs murmurs or bruit GI: Abdomen is soft.  Nontender nondistended.  Bowel sounds are present normal.  No masses organomegaly Extremities: No edema.  Full range of motion of lower extremities.  Physical deconditioning noted. Neurologic: Oriented only to person.  No focal neurological deficits.   Lab Results:  Data Reviewed: I have personally reviewed following labs and reports of the imaging studies  CBC: Recent Labs  Lab 12/14/23 1432 12/15/23 0913 12/16/23 0633  WBC 7.6 7.5 7.9  NEUTROABS 4.9  --   --   HGB 11.8* 12.0 11.8*  HCT 41.1 37.7 38.8  MCV 109.3* 99.5 102.1*  PLT 137* 159 159    Basic Metabolic Panel: Recent Labs  Lab 12/14/23 1432 12/15/23 0655 12/16/23 0633  NA 139 143 144  K 3.6 3.6 4.2  CL 111 116* 118*  CO2 18* 19* 17*  GLUCOSE 142* 94 118*  BUN 23 20 21   CREATININE 1.16* 0.93 0.96  CALCIUM  8.7* 8.9 9.0    GFR: Estimated Creatinine Clearance: 63 mL/min (by C-G formula based on SCr of 0.96 mg/dL).  Liver Function Tests: Recent Labs  Lab 12/14/23 1432 12/15/23 0655 12/16/23 0633  AST 33 30 35  ALT 20 18 19   ALKPHOS 126 107 107  BILITOT 1.3* 1.3* 1.6*  PROT 5.8* 5.2* 5.7*  ALBUMIN  3.1* 2.9* 3.1*    Recent Labs  Lab 12/14/23 1432 12/16/23 0633  AMMONIA 150* 96*    Coagulation Profile: Recent Labs  Lab 12/15/23 0655  INR 1.5*    CBG: Recent Labs  Lab 12/14/23 1432  GLUCAP 131*     Recent Results (from the past 240 hours)  Resp panel by RT-PCR (RSV, Flu A&B, Covid) Anterior Nasal Swab     Status: None   Collection Time: 12/14/23  2:32 PM   Specimen: Anterior Nasal Swab  Result Value Ref Range Status   SARS Coronavirus 2 by RT PCR NEGATIVE NEGATIVE Final    Comment: (NOTE) SARS-CoV-2 target nucleic acids  are NOT DETECTED.  The SARS-CoV-2 RNA is generally detectable in upper respiratory specimens during the acute phase of infection. The lowest concentration of SARS-CoV-2 viral copies this assay can detect is 138 copies/mL. A negative result does not preclude SARS-Cov-2 infection and should not be used as the sole basis for treatment or other patient management decisions. A negative result may occur with  improper specimen collection/handling, submission of specimen other than nasopharyngeal swab, presence of viral mutation(s) within the areas targeted by this assay, and inadequate number of viral copies(<138 copies/mL). A negative result must be combined with clinical observations, patient history, and epidemiological information. The expected result is Negative.  Fact Sheet for  Patients:  bloggercourse.com  Fact Sheet for Healthcare Providers:  seriousbroker.it  This test is no t yet approved or cleared by the [place]  FDA and  has been authorized for detection and/or diagnosis of SARS-CoV-2 by FDA under an Emergency Use Authorization (EUA). This EUA will remain  in effect (meaning this test can be used) for the duration of the COVID-19 declaration under Section 564(b)(1) of the Act, 21 U.S.C.section 360bbb-3(b)(1), unless the authorization is terminated  or revoked sooner.       Influenza A by PCR NEGATIVE NEGATIVE Final   Influenza B by PCR NEGATIVE NEGATIVE Final    Comment: (NOTE) The Xpert Xpress SARS-CoV-2/FLU/RSV plus assay is intended as an aid in the diagnosis of influenza from Nasopharyngeal swab specimens and should not be used as a sole basis for treatment. Nasal washings and aspirates are unacceptable for Xpert Xpress SARS-CoV-2/FLU/RSV testing.  Fact Sheet for Patients: bloggercourse.com  Fact Sheet for Healthcare Providers: seriousbroker.it  This test is  not yet approved or cleared by the [place]  FDA and has been authorized for detection and/or diagnosis of SARS-CoV-2 by FDA under an Emergency Use Authorization (EUA). This EUA will remain in effect (meaning this test can be used) for the duration of the COVID-19 declaration under Section 564(b)(1) of the Act, 21 U.S.C. section 360bbb-3(b)(1), unless the authorization is terminated or revoked.     Resp Syncytial Virus by PCR NEGATIVE NEGATIVE Final    Comment: (NOTE) Fact Sheet for Patients: bloggercourse.com  Fact Sheet for Healthcare Providers: seriousbroker.it  This test is not yet approved or cleared by the [place]  FDA and has been authorized for detection and/or diagnosis of SARS-CoV-2 by FDA under an Emergency Use Authorization (EUA). This EUA will remain in effect (meaning this test can be used) for the duration of the COVID-19 declaration under Section 564(b)(1) of the Act, 21 U.S.C. section 360bbb-3(b)(1), unless the authorization is terminated or revoked.  Performed at [facility], [address], [place], [place] [address]       Radiology Studies: CT Head Wo Contrast Result Date: 12/14/2023 EXAM: CT HEAD WITHOUT CONTRAST 12/14/2023 04:53:00 PM TECHNIQUE: CT of the head was performed without the administration of intravenous contrast. Automated exposure control, iterative reconstruction, and/or weight based adjustment of the mA/kV was utilized to reduce the radiation dose to as low as reasonably achievable. COMPARISON: Head CT 10/29/2023 and MRI 10/17/2022. CLINICAL HISTORY: Mental status change, unknown cause. FINDINGS: BRAIN AND VENTRICLES: There is no evidence of an acute infarct, intracranial hemorrhage, mass, midline shift, hydrocephalus, or extra-axial fluid collection. Cerebral volume is within normal limits for age. Patchy hypodensities in the cerebral white matter are similar to the  prior CT and nonspecific but compatible with moderate chronic small vessel ischemic disease. Calcified atherosclerosis at the skull base. ORBITS: Left cataract extraction. SINUSES: No acute abnormality. SOFT TISSUES AND SKULL: No acute soft tissue abnormality. No skull fracture. IMPRESSION: 1. No acute intracranial abnormality. 2. Moderate chronic small vessel ischemic disease. Electronically signed by: Dasie Hamburg MD 12/14/2023 05:45 PM EST RP Workstation: [id number]   DG Chest Portable 1 View Result Date: 12/14/2023 CLINICAL DATA:  Altered mental status EXAM: PORTABLE CHEST 1 VIEW COMPARISON:  Prior chest x-ray 10/29/2023 FINDINGS: The heart size and mediastinal contours are within normal limits. Both lungs are clear save for mild chronic bronchitic changes. The visualized skeletal structures are unremarkable. Atherosclerotic vascular calcifications present in the transverse aorta. IMPRESSION: No active disease. Electronically Signed   By: Wilkie  Karalee M.D.   On: 12/14/2023 15:39       LOS: 0 days   Syniah Berne  [facility] Pager on [email or web address]  12/16/2023, 10:38 AM

## 2023-12-16 NOTE — Progress Notes (Signed)
 Re-assessed pt's bottom at end of shift, no signs of redness or any skin breakdown. Pt resting comfortably in bed.

## 2023-12-16 NOTE — Plan of Care (Signed)
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
## 2023-12-17 LAB — BASIC METABOLIC PANEL WITH GFR
Anion gap: 8 (ref 5–15)
BUN: 21 mg/dL (ref 8–23)
CO2: 20 mmol/L — ABNORMAL LOW (ref 22–32)
Calcium: 8.6 mg/dL — ABNORMAL LOW (ref 8.9–10.3)
Chloride: 111 mmol/L (ref 98–111)
Creatinine, Ser: 1.1 mg/dL — ABNORMAL HIGH (ref 0.44–1.00)
GFR, Estimated: 55 mL/min — ABNORMAL LOW (ref >60)
Glucose, Bld: 111 mg/dL — ABNORMAL HIGH (ref 70–99)
Potassium: 3.8 mmol/L (ref 3.5–5.1)
Sodium: 139 mmol/L (ref 135–145)

## 2023-12-17 LAB — AMMONIA: Ammonia: 144 umol/L — ABNORMAL HIGH (ref 9–35)

## 2023-12-17 LAB — GLUCOSE, CAPILLARY: Glucose-Capillary: 116 mg/dL — ABNORMAL HIGH (ref 70–99)

## 2023-12-17 MED ORDER — PROCHLORPERAZINE MALEATE 10 MG PO TABS: 5.0000 mg | ORAL_TABLET | Freq: Four times a day (QID) | ORAL | Status: DC | PRN

## 2023-12-17 MED ORDER — OXYCODONE HCL 5 MG PO TABS
2.5000 mg | ORAL_TABLET | Freq: Once | ORAL | Status: AC
  Administered 2023-12-17: 2.5 mg via ORAL
  Filled 2023-12-17: qty 1

## 2023-12-17 MED ORDER — MECLIZINE HCL 25 MG PO TABS: 25.0000 mg | ORAL_TABLET | Freq: Three times a day (TID) | ORAL | Status: DC | PRN

## 2023-12-17 MED ORDER — SENNOSIDES-DOCUSATE SODIUM 8.6-50 MG PO TABS
1.0000 | ORAL_TABLET | Freq: Two times a day (BID) | ORAL | Status: DC
  Administered 2023-12-17 – 2023-12-18 (×2): 1 via ORAL
  Filled 2023-12-17 (×2): qty 1

## 2023-12-17 NOTE — Progress Notes (Addendum)
 PROGRESS NOTE  [name]  FMW:[id number] DOB: 1955-11-23 DOA: 12/14/2023 PCP: [name], MD   Brief Narrative: Patient is a 68 year old female with history of [name] with recurrent hypertension apathy, CKD stage IIIa, iron  deficiency anemia, chronic thrombocytopenia, hypothyroidism, GAD, RLS, chronic back pain/abdominal pain, short gut  syndrome with chronic diarrhea who presented with confusion.  Ammonia level found to be increased on presentation.  Currently on lactulose , rifaximin   Assessment & Plan:  Principal Problem:   Hepatic encephalopathy (HCC) Active Problems:   Liver cirrhosis secondary to NASH (HCC)   Chronic kidney disease, stage 3a (HCC)   Thrombocytopenia (HCC) - Baseline 100-130K   Hypothyroidism   Anemia of chronic disease  Hepatic encephalopathy/NASH cirrhosis: Follows with GI at [facility].  Amio level of 150 presentation.  Baseline ammonia level of 60.  Continue lactulose , rifaximin .  Despite being on both, ammonia level of 144 today.  Mentation has improved.  She is alert and oriented.  No asterixis.  Continue furosemide , spironolactone .  On midodrine  for blood pressure support.  Has stable thrombocytopenia. As per the request of the son, added Senokot  Dizziness: Has history of dizziness/vertigo in the past.  Ordered meclizine .  Will check orthostatic vitals.  CKD stage IIIa: Currently confused at baseline.  Anemia of chronic disease: Currently hemoglobin stable  Hypothyroidism: Continue Synthyroid  Chronic pain syndrome: On narcotics.  Continue supportive care  Restless leg syndrome: On ropinirole   GAD :on Effexor   Debility/deconditioning: Patient seen by PT and recommended home with on discharge  Obesity: BMI of 38         DVT prophylaxis:SCDs Start: 12/14/23 1955     Code Status: Do not attempt resuscitation (DNR) PRE-ARREST INTERVENTIONS DESIRED  Family Communication: Son at bedside  Patient status:Obs  Patient is from  :Home  Anticipated discharge un:Ynfz  Estimated DC date:1-2 days   Consultants: None  Procedures:None  Antimicrobials:  Anti-infectives (From admission, onward)    Start     Dose/Rate Route Frequency Ordered Stop   12/14/23 2200  rifaximin  (XIFAXAN ) tablet 550 mg        550 mg Oral 2 times daily 12/14/23 2001         Subjective: Patient seen and examined at bedside today.  Hemodynamically stable lying in bed.  Complains of dizziness today.  Feels as if the room is spinning around her.  Blood pressure stable.  Denies nausea or vomiting.  Currently she is alert and oriented.  No hand tremors.  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  Gastrointestinal system: Abdomen is nondistended, soft and nontender.  Epigastric hernia Central nervous system: Alert and oriented Extremities: No  edema, no clubbing ,no cyanosis Skin: No rashes, no ulcers,no icterus     Data Reviewed: I have personally reviewed following labs and imaging studies  CBC: Recent Labs  Lab 12/14/23 1432 12/15/23 0913 12/16/23 0633  WBC 7.6 7.5 7.9  NEUTROABS 4.9  --   --   HGB 11.8* 12.0 11.8*  HCT 41.1 37.7 38.8  MCV 109.3* 99.5 102.1*  PLT 137* 159 159   Basic Metabolic Panel: Recent Labs  Lab 12/14/23 1432 12/15/23 0655  12/16/23 0633 12/17/23 0549  NA 139 143 144 139  K 3.6 3.6 4.2 3.8  CL 111 116* 118* 111  CO2 18* 19* 17* 20*  GLUCOSE 142* 94 118* 111*  BUN 23 20 21 21   CREATININE 1.16* 0.93 0.96 1.10*  CALCIUM  8.7* 8.9 9.0 8.6*     Recent Results (from the past 240 hours)  Resp panel by RT-PCR (RSV, Flu A&B, Covid) Anterior Nasal Swab     Status: None   Collection Time: 12/14/23  2:32 PM   Specimen: Anterior Nasal Swab  Result Value Ref Range Status   SARS Coronavirus 2 by RT PCR NEGATIVE NEGATIVE Final    Comment: (NOTE) SARS-CoV-2 target nucleic acids are NOT DETECTED.  The SARS-CoV-2 RNA is generally detectable in upper respiratory specimens during the acute phase of infection. The lowest concentration of SARS-CoV-2 viral copies this assay can detect is 138 copies/mL. A negative result does not preclude SARS-Cov-2 infection and should not be used as the sole basis for treatment or other patient management decisions. A negative result may occur with  improper specimen collection/handling, submission of specimen other than nasopharyngeal swab, presence of viral mutation(s) within the areas targeted by this assay, and inadequate number of viral copies(<138 copies/mL). A negative result must be combined with clinical observations, patient history, and epidemiological information. The expected result is Negative.  Fact Sheet for Patients:  bloggercourse.com  Fact Sheet for Healthcare Providers:  seriousbroker.it  This test is no t yet approved or cleared by the [place]  FDA and  has been authorized for detection and/or diagnosis of SARS-CoV-2 by FDA under an Emergency Use Authorization (EUA). This EUA will remain  in effect (meaning this test can be used) for the duration of the COVID-19 declaration under Section 564(b)(1) of the Act, 21 U.S.C.section 360bbb-3(b)(1), unless the authorization is terminated  or revoked sooner.        Influenza A by PCR NEGATIVE NEGATIVE Final   Influenza B by PCR NEGATIVE NEGATIVE Final    Comment: (NOTE) The Xpert Xpress SARS-CoV-2/FLU/RSV plus assay is intended as an aid in the diagnosis of influenza from Nasopharyngeal swab specimens and should not be used as a sole basis for treatment. Nasal washings and aspirates are unacceptable for Xpert Xpress SARS-CoV-2/FLU/RSV testing.  Fact Sheet for Patients: bloggercourse.com  Fact Sheet for Healthcare Providers: seriousbroker.it  This test is not yet approved or cleared by the [place]  FDA and has been authorized for detection and/or diagnosis of SARS-CoV-2 by FDA under an Emergency Use Authorization (EUA). This EUA will remain in effect (meaning this test can be used) for the duration of the COVID-19 declaration under Section 564(b)(1) of the Act, 21 U.S.C. section 360bbb-3(b)(1), unless the authorization is terminated or revoked.     Resp Syncytial Virus by PCR NEGATIVE NEGATIVE Final    Comment: (NOTE) Fact Sheet for Patients: bloggercourse.com  Fact Sheet for Healthcare Providers: seriousbroker.it  This test is not yet approved or cleared by the [place]  FDA  and has been authorized for detection and/or diagnosis of SARS-CoV-2 by FDA under an Emergency Use Authorization (EUA). This EUA will remain in effect (meaning this test can be used) for the duration of the COVID-19 declaration under Section 564(b)(1) of the Act, 21 U.S.C. section 360bbb-3(b)(1), unless the authorization is terminated or revoked.  Performed at [facility], [address] [place], [place] [address]      Radiology Studies: No results found.  Scheduled Meds:  atorvastatin   40 mg Oral Daily   copper   2 mg Oral BID   furosemide   40 mg Oral Daily   lactulose   30 g Oral QID   levothyroxine   175 mcg Oral Q0600    megestrol   40 mg Oral BID   methocarbamol   500 mg Oral q AM   midodrine   10 mg Oral TID with meals   pantoprazole   40 mg Oral BID AC   potassium chloride   20 mEq Oral BID   rifaximin   550 mg Oral BID   rOPINIRole   0.25 mg Oral QHS   spironolactone   50 mg Oral Daily   venlafaxine  XR  75 mg Oral Q breakfast   Continuous Infusions:   LOS: 0 days   Ivonne Mustache, MD [facility] P12/07/2023, 11:11 AM

## 2023-12-17 NOTE — Plan of Care (Signed)

## 2023-12-18 ENCOUNTER — Other Ambulatory Visit (HOSPITAL_COMMUNITY): Payer: Self-pay

## 2023-12-18 DIAGNOSIS — K7682 Hepatic encephalopathy: Secondary | ICD-10-CM | POA: Diagnosis not present

## 2023-12-18 LAB — AMMONIA: Ammonia: 90 umol/L — ABNORMAL HIGH (ref 9–35)

## 2023-12-18 MED ORDER — FUROSEMIDE 40 MG PO TABS: 40.0000 mg | ORAL_TABLET | Freq: Every day | ORAL | Status: DC

## 2023-12-18 MED ORDER — MECLIZINE HCL 25 MG PO TABS
25.0000 mg | ORAL_TABLET | Freq: Three times a day (TID) | ORAL | 0 refills | Status: AC | PRN
  Filled 2023-12-18: qty 30, 10d supply, fill #0

## 2023-12-18 NOTE — Plan of Care (Signed)

## 2023-12-18 NOTE — Progress Notes (Signed)
 Mobility Specialist - Progress Note   12/18/23 1100  Mobility  Activity Ambulated with assistance  Level of Assistance Standby assist, set-up cues, supervision of patient - no hands on  Assistive Device Front wheel walker  Distance Ambulated (ft) 170 ft  Range of Motion/Exercises Active  Activity Response Tolerated well  Mobility Referral Yes  Mobility visit 1 Mobility  Mobility Specialist Start Time (ACUTE ONLY) 1114  Mobility Specialist Stop Time (ACUTE ONLY) 1122  Mobility Specialist Time Calculation (min) (ACUTE ONLY) 8 min   Received in bed and agreed to mobility. Had no issues throughout session. Returned to bed with all needs met.  Cyndee Ada Mobility Specialist

## 2023-12-18 NOTE — Discharge Summary (Signed)
 Physician Discharge Summary  Damon Baisch FMW:[id number] DOB: 09-02-1955 DOA: 12/14/2023  PCP: [name], MD  Admit date: 12/14/2023 Discharge date: 12/18/2023  Admitted From: Home Disposition:  Home  Discharge Condition:Stable CODE STATUS:FULL, DNR, Comfort Care Diet recommendation: Heart Healthy / Carb Modified / Regular / Dysphagia   Brief/Interim Summary: Patient is a 68 year old female with history of [name] with recurrent hypertension apathy, CKD stage IIIa, iron  deficiency anemia, chronic thrombocytopenia, hypothyroidism, GAD, RLS, chronic back pain/abdominal pain, short gut  syndrome with chronic diarrhea who presented with confusion.  Ammonia level found to be increased on presentation.  She was restarted on lactulose  and rifaximin .  This morning, ammonia level has improved to 90.  She does not confused.  She feels ready to go home.  PT recommend home health.  Medically stable for discharge  Following problems were addressed during the hospitalization:  Hepatic encephalopathy/NASH cirrhosis: Follows with GI at [facility].  NH3 level of 150 presentation. Improved to 90 today.  Continue lactulose , rifaximin .   Mentation has improved.  She is alert and oriented.  No asterixis.  Continue furosemide , spironolactone .  On midodrine  for blood pressure support.  Has stable thrombocytopenia. Recommend to follow-up with her own gastroenterologist at [facility].   Dizziness: Has history of dizziness/vertigo in the past.  Ordered meclizine  with improvement.  Orthostatic vitals negative   CKD stage IIIa: Currently kidney function at baseline.   Anemia of chronic disease: Currently hemoglobin stable   Hypothyroidism: Continue Synthyroid   Chronic pain syndrome:.  Continue supportive care.  Continue home medications   Restless leg syndrome: On ropinirole    GAD :on Effexor    Debility/deconditioning: Patient seen by PT and recommended home with on discharge   Obesity: BMI of  38       Discharge Diagnoses:  Principal Problem:   Hepatic encephalopathy (HCC) Active Problems:   Liver cirrhosis secondary to NASH (HCC)   Chronic kidney disease, stage 3a (HCC)   Thrombocytopenia (HCC) - Baseline 100-130K   Hypothyroidism   Anemia of chronic disease    Discharge Instructions  Discharge Instructions     Diet - low sodium heart healthy   Complete by: As directed    Discharge instructions   Complete by: As directed    1)Please take your medications as instructed. 2)Follow up with your PCP and gastroenterologist as an outpatient.  Continue taking rifaximin  and lactulose  as instructed.   Increase activity slowly   Complete by: As directed       Allergies as of 12/18/2023       Reactions   Ace Inhibitors Anaphylaxis, Swelling   Gadolinium Derivatives Anaphylaxis   Ivp Dye [iodinated Contrast Media] Anaphylaxis   Wound Dressing Adhesive Dermatitis   Desitin [zinc  Oxide] Rash, Other (See Comments)   Worsens rash   Butrans [buprenorphine] Other (See Comments)   Discontinued due to liver function   Yellow Jacket Venom Swelling   Hibiclens  [chlorhexidine  Gluconate] Rash, Other (See Comments)   Worsens rash   Penicillins Dermatitis, Rash   Vibramycin [doxycycline] Rash   Zofran  [ondansetron ] Rash        Medication List     TAKE these medications    acetaminophen  500 MG tablet Commonly known as: TYLENOL  Take 500-1,000 mg by mouth every 6 (six) hours as needed for headache or fever (pain).   albuterol  108 (90 Base) MCG/ACT inhaler Commonly known as: VENTOLIN  HFA Inhale 2 puffs into the lungs every 6 (six) hours as needed for wheezing or shortness of breath.  atorvastatin  40 MG tablet Commonly known as: LIPITOR TAKE 1 TABLET(40 MG) BY MOUTH IN THE MORNING What changed: See the new instructions.   benzonatate  100 MG capsule Commonly known as: Tessalon  Perles Take 1 capsule (100 mg total) by mouth 3 (three) times daily as needed. What  changed: reasons to take this   CENTRUM SILVER  ULTRA WOMENS PO Take 1 tablet by mouth daily with breakfast.   colchicine  0.6 MG tablet Take 0.6 mg by mouth 2 (two) times daily as needed (as directed for gout flares).   copper  tablet Take 2 mg by mouth in the morning and at bedtime.   cyanocobalamin  1000 MCG tablet Commonly known as: VITAMIN B12 Take 2,000 mcg by mouth every Monday, Wednesday, and Friday.   dicyclomine  10 MG capsule Commonly known as: BENTYL  Take 1 capsule (10 mg total) by mouth 3 (three) times daily as needed for spasms (abdominal cramping).   EPINEPHrine  0.3 mg/0.3 mL Soaj injection Commonly known as: EPI-PEN Inject 0.3 mg into the muscle as needed for anaphylaxis.   famotidine  20 MG tablet Commonly known as: PEPCID  Take 20 mg by mouth daily as needed for heartburn or indigestion.   furosemide  40 MG tablet Commonly known as: LASIX  Take 1 tablet (40 mg total) by mouth daily. What changed: when to take this   HYDROcodone -acetaminophen  10-325 MG tablet Commonly known as: NORCO Take 0.25-1 tablets by mouth 3 (three) times daily as needed (for pain- may take up to a sum total of 3 tablets/24 hours).   IRON -VITAMIN C  PO Take 1 tablet by mouth daily.   lactulose  10 GM/15ML solution Commonly known as: CHRONULAC  Take 45 mLs (30 g total) by mouth 4 (four) times daily. What changed:  how much to take when to take this additional instructions   levothyroxine  175 MCG tablet Commonly known as: SYNTHROID  TAKE 1 TABLET BY MOUTH DAILY A 6AM. RECHECK TSH WITH PCP IN 4 WEEKS What changed: See the new instructions.   magnesium  gluconate 500 (27 Mg) MG Tabs tablet Commonly known as: MAGONATE Take 1 tablet (500 mg total) by mouth in the morning and at bedtime. What changed:  how much to take when to take this   meclizine  25 MG tablet Commonly known as: ANTIVERT  Take 1 tablet (25 mg total) by mouth 3 (three) times daily as needed for dizziness.   megestrol  40  MG tablet Commonly known as: MEGACE  Take 1 tablet (40 mg total) by mouth 2 (two) times daily.   methocarbamol  500 MG tablet Commonly known as: ROBAXIN  Take 500 mg by mouth in the morning.   midodrine  10 MG tablet Commonly known as: PROAMATINE  Take 10 mg by mouth 3 (three) times daily.   naloxone 4 MG/0.1ML Liqd nasal spray kit Commonly known as: NARCAN Place 1 spray into the nose as needed (accidental overdose).   Nucynta ER 200 MG Tb12 Generic drug: Tapentadol HCl Take 200 mg by mouth every 12 (twelve) hours as needed (for pain).   nystatin  cream Commonly known as: MYCOSTATIN  Apply 1 Application topically 2 (two) times daily as needed for dry skin. What changed: reasons to take this   pantoprazole  40 MG tablet Commonly known as: PROTONIX  Take 1 tablet (40 mg total) by mouth 2 (two) times daily before a meal.   potassium chloride  10 MEQ tablet Commonly known as: KLOR-CON  Take 2 tablets (20 mEq total) by mouth 2 (two) times daily.   rifaximin  550 MG Tabs tablet Commonly known as: XIFAXAN  Take 550 mg by mouth in  the morning and at bedtime.   rOPINIRole  0.25 MG tablet Commonly known as: REQUIP  TAKE 1 TABLET(0.25 MG) BY MOUTH AT BEDTIME What changed: See the new instructions.   senna-docusate 8.6-50 MG tablet Commonly known as: Senokot-S Take 1 tablet by mouth 2 (two) times daily.   spironolactone  50 MG tablet Commonly known as: ALDACTONE  Take 1 tablet (50 mg total) by mouth daily.   triamcinolone  cream 0.1 % Commonly known as: KENALOG  Apply 1 Application topically 2 (two) times daily as needed (skin irritation).   venlafaxine  XR 75 MG 24 hr capsule Commonly known as: EFFEXOR -XR Take 1 capsule (75 mg total) by mouth daily with breakfast.   Vitamin D  (Ergocalciferol ) 1.25 MG (50000 UNIT) Caps capsule Commonly known as: DRISDOL  Take 50,000 Units by mouth every Wednesday.        Contact information for follow-up providers     [name], MD. Schedule  an appointment as soon as possible for a visit in 1 week(s).   Specialty: Family Medicine Contact information: [address] [place] [address] [phone]              Contact information for after-discharge care     Home Medical Care     [facility]) .   Service: Home Health Services Contact information: [address] [place]  [phone]                    Allergies  Allergen Reactions   Ace Inhibitors Anaphylaxis and Swelling   Gadolinium Derivatives Anaphylaxis   Ivp Dye [Iodinated Contrast Media] Anaphylaxis   Wound Dressing Adhesive Dermatitis   Desitin [Zinc  Oxide] Rash and Other (See Comments)    Worsens rash   Butrans [Buprenorphine] Other (See Comments)    Discontinued due to liver function   Yellow Jacket Venom Swelling   Hibiclens  [Chlorhexidine  Gluconate] Rash and Other (See Comments)    Worsens rash   Penicillins Dermatitis and Rash   Vibramycin [Doxycycline] Rash   Zofran  [Ondansetron ] Rash    Consultations: None   Procedures/Studies: CT Head Wo Contrast Result Date: 12/14/2023 EXAM: CT HEAD WITHOUT CONTRAST 12/14/2023 04:53:00 PM TECHNIQUE: CT of the head was performed without the administration of intravenous contrast. Automated exposure control, iterative reconstruction, and/or weight based adjustment of the mA/kV was utilized to reduce the radiation dose to as low as reasonably achievable. COMPARISON: Head CT 10/29/2023 and MRI 10/17/2022. CLINICAL HISTORY: Mental status change, unknown cause. FINDINGS: BRAIN AND VENTRICLES: There is no evidence of an acute infarct, intracranial hemorrhage, mass, midline shift, hydrocephalus, or extra-axial fluid collection. Cerebral volume is within normal limits for age. Patchy hypodensities in the cerebral white matter are similar to the prior CT and nonspecific but compatible with moderate chronic small vessel ischemic  disease. Calcified atherosclerosis at the skull base. ORBITS: Left cataract extraction. SINUSES: No acute abnormality. SOFT TISSUES AND SKULL: No acute soft tissue abnormality. No skull fracture. IMPRESSION: 1. No acute intracranial abnormality. 2. Moderate chronic small vessel ischemic disease. Electronically signed by: Dasie Hamburg MD 12/14/2023 05:45 PM EST RP Workstation: [id number]   DG Chest Portable 1 View Result Date: 12/14/2023 CLINICAL DATA:  Altered mental status EXAM: PORTABLE CHEST 1 VIEW COMPARISON:  Prior chest x-ray 10/29/2023 FINDINGS: The heart size and mediastinal contours are within normal limits. Both lungs are clear save for mild chronic bronchitic changes. The visualized skeletal structures are unremarkable. Atherosclerotic vascular calcifications present in the transverse  aorta. IMPRESSION: No active disease. Electronically Signed   By: Wilkie Lent M.D.   On: 12/14/2023 15:39      Subjective: Patient seen and examined at bedside today.  Hemodynamically stable.  Overall comfortable.  Lying in bed.  Denies any nausea or vomiting this morning.  Having bowel movements.  Hemoglobin improved.  Remains alert and oriented.  Medically stable for discharge.  I discussed with her son about discharge planning on phone  Discharge Exam: Vitals:   12/17/23 2108 12/18/23 0543  BP: 107/66 124/76  Pulse: 76 75  Resp: 17 18  Temp: 97.7 F (36.5 C) 97.8 F (36.6 C)  SpO2: 99% 100%   Vitals:   12/17/23 1000 12/17/23 1145 12/17/23 2108 12/18/23 0543  BP: (!) 123/59  107/66 124/76  Pulse: 75  76 75  Resp: 15  17 18   Temp:  98 F (36.7 C) 97.7 F (36.5 C) 97.8 F (36.6 C)  TempSrc:  Oral Oral Oral  SpO2: 100% 100% 99% 100%  Weight:      Height:        General: Pt is alert, awake, not in acute distress, obese Cardiovascular: RRR, S1/S2 +, no rubs, no gallops Respiratory: CTA bilaterally, no wheezing, no rhonchi Abdominal: Soft, NT, ND, bowel sounds + Extremities: no  edema, no cyanosis    The results of significant diagnostics from this hospitalization (including imaging, microbiology, ancillary and laboratory) are listed below for reference.     Microbiology: Recent Results (from the past 240 hours)  Resp panel by RT-PCR (RSV, Flu A&B, Covid) Anterior Nasal Swab     Status: None   Collection Time: 12/14/23  2:32 PM   Specimen: Anterior Nasal Swab  Result Value Ref Range Status   SARS Coronavirus 2 by RT PCR NEGATIVE NEGATIVE Final    Comment: (NOTE) SARS-CoV-2 target nucleic acids are NOT DETECTED.  The SARS-CoV-2 RNA is generally detectable in upper respiratory specimens during the acute phase of infection. The lowest concentration of SARS-CoV-2 viral copies this assay can detect is 138 copies/mL. A negative result does not preclude SARS-Cov-2 infection and should not be used as the sole basis for treatment or other patient management decisions. A negative result may occur with  improper specimen collection/handling, submission of specimen other than nasopharyngeal swab, presence of viral mutation(s) within the areas targeted by this assay, and inadequate number of viral copies(<138 copies/mL). A negative result must be combined with clinical observations, patient history, and epidemiological information. The expected result is Negative.  Fact Sheet for Patients:  bloggercourse.com  Fact Sheet for Healthcare Providers:  seriousbroker.it  This test is no t yet approved or cleared by the [place]  FDA and  has been authorized for detection and/or diagnosis of SARS-CoV-2 by FDA under an Emergency Use Authorization (EUA). This EUA will remain  in effect (meaning this test can be used) for the duration of the COVID-19 declaration under Section 564(b)(1) of the Act, 21 U.S.C.section 360bbb-3(b)(1), unless the authorization is terminated  or revoked sooner.       Influenza A by PCR  NEGATIVE NEGATIVE Final   Influenza B by PCR NEGATIVE NEGATIVE Final    Comment: (NOTE) The Xpert Xpress SARS-CoV-2/FLU/RSV plus assay is intended as an aid in the diagnosis of influenza from Nasopharyngeal swab specimens and should not be used as a sole basis for treatment. Nasal washings and aspirates are unacceptable for Xpert Xpress SARS-CoV-2/FLU/RSV testing.  Fact Sheet for Patients: bloggercourse.com  Fact Sheet for Healthcare Providers: seriousbroker.it  This test is not yet approved or cleared by the [place]  FDA and has been authorized for detection and/or diagnosis of SARS-CoV-2 by FDA under an Emergency Use Authorization (EUA). This EUA will remain in effect (meaning this test can be used) for the duration of the COVID-19 declaration under Section 564(b)(1) of the Act, 21 U.S.C. section 360bbb-3(b)(1), unless the authorization is terminated or revoked.     Resp Syncytial Virus by PCR NEGATIVE NEGATIVE Final    Comment: (NOTE) Fact Sheet for Patients: bloggercourse.com  Fact Sheet for Healthcare Providers: seriousbroker.it  This test is not yet approved or cleared by the [place]  FDA and has been authorized for detection and/or diagnosis of SARS-CoV-2 by FDA under an Emergency Use Authorization (EUA). This EUA will remain in effect (meaning this test can be used) for the duration of the COVID-19 declaration under Section 564(b)(1) of the Act, 21 U.S.C. section 360bbb-3(b)(1), unless the authorization is terminated or revoked.  Performed at [facility], [address] [place], [place] [address]      Labs: BNP (last 3 results) Recent Labs    01/03/23 0616 08/24/23 1544  BNP 469.4* 72.9   Basic Metabolic Panel: Recent Labs  Lab 12/14/23 1432 12/15/23 0655 12/16/23 0633 12/17/23 0549  NA 139 143 144 139  K 3.6 3.6 4.2 3.8   CL 111 116* 118* 111  CO2 18* 19* 17* 20*  GLUCOSE 142* 94 118* 111*  BUN 23 20 21 21   CREATININE 1.16* 0.93 0.96 1.10*  CALCIUM  8.7* 8.9 9.0 8.6*   Liver Function Tests: Recent Labs  Lab 12/14/23 1432 12/15/23 0655 12/16/23 0633  AST 33 30 35  ALT 20 18 19   ALKPHOS 126 107 107  BILITOT 1.3* 1.3* 1.6*  PROT 5.8* 5.2* 5.7*  ALBUMIN  3.1* 2.9* 3.1*   No results for input(s): LIPASE, AMYLASE in the last 168 hours. Recent Labs  Lab 12/14/23 1432 12/16/23 0633 12/17/23 0549 12/18/23 0605  AMMONIA 150* 96* 144* 90*   CBC: Recent Labs  Lab 12/14/23 1432 12/15/23 0913 12/16/23 0633  WBC 7.6 7.5 7.9  NEUTROABS 4.9  --   --   HGB 11.8* 12.0 11.8*  HCT 41.1 37.7 38.8  MCV 109.3* 99.5 102.1*  PLT 137* 159 159   Cardiac Enzymes: No results for input(s): CKTOTAL, CKMB, CKMBINDEX, TROPONINI in the last 168 hours. BNP: Invalid input(s): POCBNP CBG: Recent Labs  Lab 12/14/23 1432 12/17/23 1047  GLUCAP 131* 116*   D-Dimer No results for input(s): DDIMER in the last 72 hours. Hgb A1c No results for input(s): HGBA1C in the last 72 hours. Lipid Profile No results for input(s): CHOL, HDL, LDLCALC, TRIG, CHOLHDL, LDLDIRECT in the last 72 hours. Thyroid  function studies No results for input(s): TSH, T4TOTAL, T3FREE, THYROIDAB in the last 72 hours.  Invalid input(s): FREET3 Anemia work up No results for input(s): VITAMINB12, FOLATE, FERRITIN, TIBC, IRON , RETICCTPCT in the last 72 hours. Urinalysis    Component Value Date/Time   COLORURINE YELLOW 12/14/2023 1432   APPEARANCEUR CLEAR 12/14/2023 1432   LABSPEC 1.023 12/14/2023 1432   PHURINE 6.0 12/14/2023 1432   GLUCOSEU NEGATIVE 12/14/2023 1432   HGBUR NEGATIVE 12/14/2023 1432   BILIRUBINUR NEGATIVE 12/14/2023 1432   KETONESUR NEGATIVE 12/14/2023 1432   PROTEINUR NEGATIVE 12/14/2023 1432   UROBILINOGEN 0.2 08/21/2013 0017   NITRITE NEGATIVE 12/14/2023 1432    LEUKOCYTESUR SMALL (A) 12/14/2023 1432   Sepsis Labs Recent Labs  Lab 12/14/23 1432 12/15/23 0913 12/16/23 9366  WBC 7.6 7.5 7.9   Microbiology Recent Results (from the past 240 hours)  Resp panel by RT-PCR (RSV, Flu A&B, Covid) Anterior Nasal Swab     Status: None   Collection Time: 12/14/23  2:32 PM   Specimen: Anterior Nasal Swab  Result Value Ref Range Status   SARS Coronavirus 2 by RT PCR NEGATIVE NEGATIVE Final    Comment: (NOTE) SARS-CoV-2 target nucleic acids are NOT DETECTED.  The SARS-CoV-2 RNA is generally detectable in upper respiratory specimens during the acute phase of infection. The lowest concentration of SARS-CoV-2 viral copies this assay can detect is 138 copies/mL. A negative result does not preclude SARS-Cov-2 infection and should not be used as the sole basis for treatment or other patient management decisions. A negative result may occur with  improper specimen collection/handling, submission of specimen other than nasopharyngeal swab, presence of viral mutation(s) within the areas targeted by this assay, and inadequate number of viral copies(<138 copies/mL). A negative result must be combined with clinical observations, patient history, and epidemiological information. The expected result is Negative.  Fact Sheet for Patients:  bloggercourse.com  Fact Sheet for Healthcare Providers:  seriousbroker.it  This test is no t yet approved or cleared by the [place]  FDA and  has been authorized for detection and/or diagnosis of SARS-CoV-2 by FDA under an Emergency Use Authorization (EUA). This EUA will remain  in effect (meaning this test can be used) for the duration of the COVID-19 declaration under Section 564(b)(1) of the Act, 21 U.S.C.section 360bbb-3(b)(1), unless the authorization is terminated  or revoked sooner.       Influenza A by PCR NEGATIVE NEGATIVE Final   Influenza B by PCR NEGATIVE  NEGATIVE Final    Comment: (NOTE) The Xpert Xpress SARS-CoV-2/FLU/RSV plus assay is intended as an aid in the diagnosis of influenza from Nasopharyngeal swab specimens and should not be used as a sole basis for treatment. Nasal washings and aspirates are unacceptable for Xpert Xpress SARS-CoV-2/FLU/RSV testing.  Fact Sheet for Patients: bloggercourse.com  Fact Sheet for Healthcare Providers: seriousbroker.it  This test is not yet approved or cleared by the [place]  FDA and has been authorized for detection and/or diagnosis of SARS-CoV-2 by FDA under an Emergency Use Authorization (EUA). This EUA will remain in effect (meaning this test can be used) for the duration of the COVID-19 declaration under Section 564(b)(1) of the Act, 21 U.S.C. section 360bbb-3(b)(1), unless the authorization is terminated or revoked.     Resp Syncytial Virus by PCR NEGATIVE NEGATIVE Final    Comment: (NOTE) Fact Sheet for Patients: bloggercourse.com  Fact Sheet for Healthcare Providers: seriousbroker.it  This test is not yet approved or cleared by the [place]  FDA and has been authorized for detection and/or diagnosis of SARS-CoV-2 by FDA under an Emergency Use Authorization (EUA). This EUA will remain in effect (meaning this test can be used) for the duration of the COVID-19 declaration under Section 564(b)(1) of the Act, 21 U.S.C. section 360bbb-3(b)(1), unless the authorization is terminated or revoked.  Performed at [facility], [address], [place], [place] [address]     Please note: You were cared for by a hospitalist during your hospital stay. Once you are discharged, your primary care physician will handle any further medical issues. Please note that NO REFILLS for any discharge medications will be authorized once you are discharged, as it is imperative that  you return to your primary care physician (or establish a relationship with a primary  care physician if you do not have one) for your post hospital discharge needs so that they can reassess your need for medications and monitor your lab values.    Time coordinating discharge: 40 minutes  SIGNED:   Ivonne Mustache, MD  [facility] 12/18/2023, 11:06 AM Pager [phone]  If 7PM-7AM, please contact night-coverage [email or web address] Password [email or web address]

## 2023-12-19 ENCOUNTER — Telehealth: Payer: Self-pay

## 2023-12-19 NOTE — Transitions of Care (Post Inpatient/ED Visit) (Signed)
 12/19/2023  Name: [name] MRN: [id number] DOB: 26-Jan-1955  Today's TOC FU Call Status: Today's TOC FU Call Status:: Successful TOC FU Call Completed TOC FU Call Complete Date: 12/19/23  Patient's Name and Date of Birth confirmed. DOB, Name  Transition Care Management Follow-up Telephone Call Date of Discharge: 12/18/23 Discharge Facility: [facility]) Type of Discharge: Inpatient Admission Primary Inpatient Discharge Diagnosis:: hepatic encephalopathy How have you been since you were released from the hospital?: Better Any questions or concerns?: No  Items Reviewed: Did you receive and understand the discharge instructions provided?: Yes Medications obtained,verified, and reconciled?: Yes (Medications Reviewed) Any new allergies since your discharge?: No Dietary orders reviewed?: Yes Type of Diet Ordered:: heart healthy Do you have support at home?: Yes People in Home [RPT]: child(ren), adult Name of Support/Comfort Primary Source: son- [name]  Medications Reviewed Today: Medications Reviewed Today     Reviewed by Lavelle Charmaine NOVAK, LPN (Licensed Practical Nurse) on 12/19/23 at 1057  Med List Status: <None>   Medication Order Taking? Sig Documenting Provider Last Dose Status Informant  acetaminophen  (TYLENOL ) 500 MG tablet [id number] No Take 500-1,000 mg by mouth every 6 (six) hours as needed for headache or fever (pain). [provider] Unknown Active Family Member  albuterol  (VENTOLIN  HFA) 108 [phone] Base) MCG/ACT inhaler [id number] No Inhale 2 puffs into the lungs every 6 (six) hours as needed for wheezing or shortness of breath. [name], MD Past Month Active Family Member  atorvastatin  (LIPITOR) 40 MG tablet [id number] No TAKE 1 TABLET(40 MG) BY MOUTH IN THE MORNING  Patient taking differently: Take 40 mg by mouth in the morning.   [name], MD 12/13/2023 Morning Active Family Member  benzonatate  (TESSALON  PERLES) 100 MG capsule [id number] No Take  1 capsule (100 mg total) by mouth 3 (three) times daily as needed.  Patient taking differently: Take 100 mg by mouth 3 (three) times daily as needed for cough.   [name], MD Unknown Active Family Member  colchicine  0.6 MG tablet [id number] No Take 0.6 mg by mouth 2 (two) times daily as needed (as directed for gout flares). [provider] Unknown Active Family Member  copper  tablet [id number] No Take 2 mg by mouth in the morning and at bedtime. [provider] 12/13/2023 Bedtime Active Family Member  cyanocobalamin  (VITAMIN B12) 1000 MCG tablet [id number] No Take 2,000 mcg by mouth every Monday, Wednesday, and Friday. [provider] 12/13/2023 Active Family Member  dicyclomine  (BENTYL ) 10 MG capsule [id number] No Take 1 capsule (10 mg total) by mouth 3 (three) times daily as needed for spasms (abdominal cramping). [name], MD Unknown Active Family Member  EPINEPHrine  0.3 mg/0.3 mL IJ SOAJ injection [id number] No Inject 0.3 mg into the muscle as needed for anaphylaxis. [name], MD Unknown Active Family Member  famotidine  (PEPCID ) 20 MG tablet [id number] No Take 20 mg by mouth daily as needed for heartburn or indigestion. [provider] Unknown Active Family Member  furosemide  (LASIX ) 40 MG tablet [id number]  Take 1 tablet (40 mg total) by mouth daily. Jillian Buttery, MD  Active   HYDROcodone -acetaminophen  [facility]) 10-325 MG tablet [id number] No Take 0.25-1 tablets by mouth 3 (three) times daily as needed (for pain- may take up to a sum total of 3 tablets/24 hours). [provider] Past Week Active Family Member  IRON -VITAMIN C  PO [id number] No Take 1 tablet by mouth daily. [provider] 12/13/2023 Morning Active Family Member  lactulose  (CHRONULAC ) 10 GM/15ML solution [id number] No Take  45 mLs (30 g total) by mouth 4 (four) times daily.  Patient taking differently: Take 45 g by mouth See admin instructions. Take 45 grams by mouth in the  morning and afternoon   Barbarann Nest, MD 12/14/2023 Morning Active Family Member           Med Note MARISA, NATHANEL LOISE Schaumann Dec 14, 2023  8:23 PM)    levothyroxine  (SYNTHROID ) 175 MCG tablet [id number] No TAKE 1 TABLET BY MOUTH DAILY A 6AM. RECHECK TSH WITH PCP IN 4 WEEKS  Patient taking differently: Take 175 mcg by mouth daily before breakfast.   [name], MD 12/13/2023 Morning Active Family Member  magnesium  gluconate (MAGONATE) 500 (27 Mg) MG TABS tablet [id number] No Take 1 tablet (500 mg total) by mouth in the morning and at bedtime.  Patient taking differently: Take 1,000 mg by mouth in the morning.   [name], MD 12/13/2023 Morning Active Family Member  meclizine  (ANTIVERT ) 25 MG tablet [id number]  Take 1 tablet (25 mg total) by mouth 3 (three) times daily as needed for dizziness. Jillian Buttery, MD  Active   megestrol  (MEGACE ) 40 MG tablet [id number] No Take 1 tablet (40 mg total) by mouth 2 (two) times daily. Ajewole, [name], MD 12/13/2023 Evening Active Family Member  methocarbamol  (ROBAXIN ) 500 MG tablet [id number] No Take 500 mg by mouth in the morning. [provider] 12/13/2023 Morning Active Family Member  midodrine  (PROAMATINE ) 10 MG tablet [id number] No Take 10 mg by mouth 3 (three) times daily. [provider] 12/13/2023 Evening Active Family Member           Med Note MARISA, NATHANEL LOISE Schaumann Dec 14, 2023  8:25 PM)    Multiple Vitamins-Minerals (CENTRUM SILVER  ULTRA WOMENS PO) [id number] No Take 1 tablet by mouth daily with breakfast. [provider] 12/13/2023 Morning Active Family Member  naloxone [facility]) nasal spray 4 mg/0.1 mL [id number] No Place 1 spray into the nose as needed (accidental overdose). [provider] Unknown Active Family Member           Med Note ROLENE RODRIGUEZ, SUSAN A   Wed Mar 08, 2023  2:39 PM)    NUCYNTA  ER 200 MG TB12 [id number] No Take 200 mg by mouth every 12 (twelve) hours as needed (for pain). [provider] Past Month Active Family Member  nystatin  cream (MYCOSTATIN ) [id number] No Apply 1 Application topically 2 (two) times daily as needed for dry skin.  Patient taking differently: Apply 1 Application topically 2 (two) times daily as needed (for irritation).   Cheryle Page, MD Unknown Active Family Member  pantoprazole  (PROTONIX ) 40 MG tablet [id number] No Take 1 tablet (40 mg total) by mouth 2 (two) times daily before a meal. [name], MD 12/13/2023 Evening Active Family Member  potassium chloride  (KLOR-CON ) 10 MEQ tablet [id number] No Take 2 tablets (20 mEq total) by mouth 2 (two) times daily. [name], MD 12/13/2023 Evening Active Family Member  rifaximin  (XIFAXAN ) 550 MG TABS tablet [id number] No Take 550 mg by mouth in the morning and at bedtime. [provider] 12/13/2023 Bedtime Active Family Member  rOPINIRole  (REQUIP ) 0.25 MG tablet [id number] No TAKE 1 TABLET(0.25 MG) BY MOUTH AT BEDTIME  Patient taking differently: Take 0.25 mg by mouth at bedtime.   [name], MD 12/13/2023 Bedtime Active Family Member  senna-docusate (SENOKOT-S) 8.6-50 MG tablet [id number] No Take 1 tablet by mouth 2 (two) times daily. Duanne Butler DASEN, MD 12/13/2023 Bedtime Active Family  Member  spironolactone  (ALDACTONE ) 50 MG tablet [id number] No Take 1 tablet (50 mg total) by mouth daily. [name], MD 12/13/2023 Morning Active Family Member  triamcinolone  cream (KENALOG ) 0.1 % [id number] No Apply 1 Application topically 2 (two) times daily as needed (skin irritation). [provider] Unknown Active Family Member  venlafaxine  XR (EFFEXOR -XR) 75 MG 24 hr capsule [id number] No Take 1 capsule (75 mg total) by mouth daily with breakfast. Duanne Butler DASEN, MD 12/13/2023 Morning Active Family Member  Vitamin D , Ergocalciferol , (DRISDOL ) 50000 UNITS CAPS [id number] No Take 50,000 Units by mouth every Wednesday. [provider] 12/13/2023 Active Family Member           Med  Note MARISA, NATHANEL LOISE Schaumann Dec 14, 2023  8:55 PM) The patient's son stated this is active, but I did not see a recent fill of it  Med List Note Geri, Jon HERO, CPhT 10/30/23 0919): [name] (son) handles all medication            Home Care and Equipment/Supplies: Were Home Health Services Ordered?: Yes Has Agency set up a time to come to your home?: Yes First Home Health Visit Date: 12/25/23 Any new equipment or medical supplies ordered?: No  Functional Questionnaire: Do you need assistance with bathing/showering or dressing?: Yes Do you need assistance with meal preparation?: Yes Do you need assistance with eating?: No Do you have difficulty maintaining continence: No Do you need assistance with getting out of bed/getting out of a chair/moving?: Yes Do you have difficulty managing or taking your medications?: No  Follow up appointments reviewed: PCP Follow-up appointment confirmed?: Yes Date of PCP follow-up appointment?: 12/27/23 Follow-up Provider: Dr. Aletha Specialist [facility] Follow-up appointment confirmed?: NA Do you need transportation to your follow-up appointment?: No Do you understand care options if your condition(s) worsen?: Yes-patient verbalized understanding    SIGNATURE Charmaine Bloodgood, LPN [facility] Advisor [facility] You Are. We Are. [facility] Direct Dial [phone]

## 2023-12-20 ENCOUNTER — Other Ambulatory Visit: Payer: Self-pay

## 2023-12-20 ENCOUNTER — Emergency Department (HOSPITAL_COMMUNITY): Payer: Medicare (Managed Care)

## 2023-12-20 ENCOUNTER — Inpatient Hospital Stay (HOSPITAL_COMMUNITY)
Admission: EM | Admit: 2023-12-20 | Discharge: 2023-12-28 | DRG: 432 | Disposition: A | Payer: Medicare (Managed Care) | Attending: Internal Medicine | Admitting: Internal Medicine

## 2023-12-20 ENCOUNTER — Encounter (HOSPITAL_COMMUNITY): Payer: Self-pay

## 2023-12-20 ENCOUNTER — Encounter: Payer: Self-pay | Admitting: Family Medicine

## 2023-12-20 ENCOUNTER — Other Ambulatory Visit: Payer: Self-pay | Admitting: Family Medicine

## 2023-12-20 DIAGNOSIS — K7581 Nonalcoholic steatohepatitis (NASH): Secondary | ICD-10-CM | POA: Diagnosis present

## 2023-12-20 DIAGNOSIS — Z8049 Family history of malignant neoplasm of other genital organs: Secondary | ICD-10-CM

## 2023-12-20 DIAGNOSIS — Z886 Allergy status to analgesic agent status: Secondary | ICD-10-CM

## 2023-12-20 DIAGNOSIS — E872 Acidosis, unspecified: Secondary | ICD-10-CM | POA: Diagnosis present

## 2023-12-20 DIAGNOSIS — Z79890 Hormone replacement therapy (postmenopausal): Secondary | ICD-10-CM

## 2023-12-20 DIAGNOSIS — S9031XA Contusion of right foot, initial encounter: Secondary | ICD-10-CM | POA: Diagnosis present

## 2023-12-20 DIAGNOSIS — N390 Urinary tract infection, site not specified: Secondary | ICD-10-CM | POA: Diagnosis present

## 2023-12-20 DIAGNOSIS — D6959 Other secondary thrombocytopenia: Secondary | ICD-10-CM | POA: Diagnosis present

## 2023-12-20 DIAGNOSIS — Z792 Long term (current) use of antibiotics: Secondary | ICD-10-CM

## 2023-12-20 DIAGNOSIS — F32A Depression, unspecified: Secondary | ICD-10-CM | POA: Diagnosis present

## 2023-12-20 DIAGNOSIS — I6523 Occlusion and stenosis of bilateral carotid arteries: Secondary | ICD-10-CM | POA: Diagnosis not present

## 2023-12-20 DIAGNOSIS — D259 Leiomyoma of uterus, unspecified: Secondary | ICD-10-CM | POA: Diagnosis present

## 2023-12-20 DIAGNOSIS — E66812 Obesity, class 2: Secondary | ICD-10-CM | POA: Diagnosis present

## 2023-12-20 DIAGNOSIS — H5462 Unqualified visual loss, left eye, normal vision right eye: Secondary | ICD-10-CM | POA: Diagnosis present

## 2023-12-20 DIAGNOSIS — X58XXXA Exposure to other specified factors, initial encounter: Secondary | ICD-10-CM | POA: Diagnosis present

## 2023-12-20 DIAGNOSIS — G9341 Metabolic encephalopathy: Secondary | ICD-10-CM | POA: Diagnosis present

## 2023-12-20 DIAGNOSIS — Z83438 Family history of other disorder of lipoprotein metabolism and other lipidemia: Secondary | ICD-10-CM

## 2023-12-20 DIAGNOSIS — R4182 Altered mental status, unspecified: Secondary | ICD-10-CM | POA: Diagnosis not present

## 2023-12-20 DIAGNOSIS — G2581 Restless legs syndrome: Secondary | ICD-10-CM | POA: Diagnosis present

## 2023-12-20 DIAGNOSIS — H179 Unspecified corneal scar and opacity: Secondary | ICD-10-CM | POA: Diagnosis present

## 2023-12-20 DIAGNOSIS — Z66 Do not resuscitate: Secondary | ICD-10-CM | POA: Diagnosis present

## 2023-12-20 DIAGNOSIS — D509 Iron deficiency anemia, unspecified: Secondary | ICD-10-CM | POA: Diagnosis present

## 2023-12-20 DIAGNOSIS — N179 Acute kidney failure, unspecified: Secondary | ICD-10-CM | POA: Diagnosis present

## 2023-12-20 DIAGNOSIS — K7682 Hepatic encephalopathy: Secondary | ICD-10-CM | POA: Diagnosis present

## 2023-12-20 DIAGNOSIS — K7460 Unspecified cirrhosis of liver: Secondary | ICD-10-CM | POA: Diagnosis present

## 2023-12-20 DIAGNOSIS — E039 Hypothyroidism, unspecified: Secondary | ICD-10-CM | POA: Diagnosis present

## 2023-12-20 DIAGNOSIS — G894 Chronic pain syndrome: Secondary | ICD-10-CM | POA: Diagnosis present

## 2023-12-20 DIAGNOSIS — D631 Anemia in chronic kidney disease: Secondary | ICD-10-CM | POA: Diagnosis present

## 2023-12-20 DIAGNOSIS — G9389 Other specified disorders of brain: Secondary | ICD-10-CM | POA: Diagnosis not present

## 2023-12-20 DIAGNOSIS — I129 Hypertensive chronic kidney disease with stage 1 through stage 4 chronic kidney disease, or unspecified chronic kidney disease: Secondary | ICD-10-CM | POA: Diagnosis present

## 2023-12-20 DIAGNOSIS — Z883 Allergy status to other anti-infective agents status: Secondary | ICD-10-CM

## 2023-12-20 DIAGNOSIS — K7290 Hepatic failure, unspecified without coma: Secondary | ICD-10-CM | POA: Diagnosis not present

## 2023-12-20 DIAGNOSIS — Z885 Allergy status to narcotic agent status: Secondary | ICD-10-CM

## 2023-12-20 DIAGNOSIS — E785 Hyperlipidemia, unspecified: Secondary | ICD-10-CM | POA: Diagnosis present

## 2023-12-20 DIAGNOSIS — Z87892 Personal history of anaphylaxis: Secondary | ICD-10-CM

## 2023-12-20 DIAGNOSIS — I9589 Other hypotension: Secondary | ICD-10-CM | POA: Diagnosis present

## 2023-12-20 DIAGNOSIS — Z888 Allergy status to other drugs, medicaments and biological substances status: Secondary | ICD-10-CM

## 2023-12-20 DIAGNOSIS — Z91048 Other nonmedicinal substance allergy status: Secondary | ICD-10-CM

## 2023-12-20 DIAGNOSIS — K219 Gastro-esophageal reflux disease without esophagitis: Secondary | ICD-10-CM | POA: Diagnosis present

## 2023-12-20 DIAGNOSIS — K90829 Short bowel syndrome, unspecified: Secondary | ICD-10-CM | POA: Diagnosis present

## 2023-12-20 DIAGNOSIS — Z6838 Body mass index (BMI) 38.0-38.9, adult: Secondary | ICD-10-CM

## 2023-12-20 DIAGNOSIS — B9620 Unspecified Escherichia coli [E. coli] as the cause of diseases classified elsewhere: Secondary | ICD-10-CM | POA: Diagnosis present

## 2023-12-20 DIAGNOSIS — E876 Hypokalemia: Secondary | ICD-10-CM | POA: Diagnosis not present

## 2023-12-20 DIAGNOSIS — M549 Dorsalgia, unspecified: Secondary | ICD-10-CM | POA: Diagnosis present

## 2023-12-20 DIAGNOSIS — Z833 Family history of diabetes mellitus: Secondary | ICD-10-CM

## 2023-12-20 DIAGNOSIS — Z808 Family history of malignant neoplasm of other organs or systems: Secondary | ICD-10-CM

## 2023-12-20 DIAGNOSIS — Z8249 Family history of ischemic heart disease and other diseases of the circulatory system: Secondary | ICD-10-CM

## 2023-12-20 DIAGNOSIS — I517 Cardiomegaly: Secondary | ICD-10-CM | POA: Diagnosis not present

## 2023-12-20 DIAGNOSIS — Z9049 Acquired absence of other specified parts of digestive tract: Secondary | ICD-10-CM

## 2023-12-20 DIAGNOSIS — Z86711 Personal history of pulmonary embolism: Secondary | ICD-10-CM

## 2023-12-20 DIAGNOSIS — D500 Iron deficiency anemia secondary to blood loss (chronic): Secondary | ICD-10-CM | POA: Diagnosis present

## 2023-12-20 DIAGNOSIS — N1831 Chronic kidney disease, stage 3a: Secondary | ICD-10-CM | POA: Diagnosis present

## 2023-12-20 DIAGNOSIS — H1789 Other corneal scars and opacities: Secondary | ICD-10-CM | POA: Diagnosis present

## 2023-12-20 DIAGNOSIS — Z79899 Other long term (current) drug therapy: Secondary | ICD-10-CM

## 2023-12-20 DIAGNOSIS — Z880 Allergy status to penicillin: Secondary | ICD-10-CM

## 2023-12-20 DIAGNOSIS — F411 Generalized anxiety disorder: Secondary | ICD-10-CM | POA: Diagnosis present

## 2023-12-20 DIAGNOSIS — I6782 Cerebral ischemia: Secondary | ICD-10-CM | POA: Diagnosis not present

## 2023-12-20 DIAGNOSIS — Z87442 Personal history of urinary calculi: Secondary | ICD-10-CM

## 2023-12-20 DIAGNOSIS — Z79891 Long term (current) use of opiate analgesic: Secondary | ICD-10-CM

## 2023-12-20 DIAGNOSIS — Z87891 Personal history of nicotine dependence: Secondary | ICD-10-CM

## 2023-12-20 DIAGNOSIS — Z91041 Radiographic dye allergy status: Secondary | ICD-10-CM

## 2023-12-20 LAB — URINALYSIS, W/ REFLEX TO CULTURE (INFECTION SUSPECTED)
Bilirubin Urine: NEGATIVE
Glucose, UA: NEGATIVE mg/dL
Ketones, ur: NEGATIVE mg/dL
Nitrite: NEGATIVE
Protein, ur: NEGATIVE mg/dL
Specific Gravity, Urine: 1.019 (ref 1.005–1.030)
pH: 6 (ref 5.0–8.0)

## 2023-12-20 LAB — CBC
HCT: 38.9 % (ref 36.0–46.0)
Hemoglobin: 12.2 g/dL (ref 12.0–15.0)
MCH: 31 pg (ref 26.0–34.0)
MCHC: 31.4 g/dL (ref 30.0–36.0)
MCV: 99 fL (ref 80.0–100.0)
Platelets: 146 K/uL — ABNORMAL LOW (ref 150–400)
RBC: 3.93 MIL/uL (ref 3.87–5.11)
RDW: 15.2 % (ref 11.5–15.5)
WBC: 9.6 K/uL (ref 4.0–10.5)
nRBC: 0 % (ref 0.0–0.2)

## 2023-12-20 LAB — COMPREHENSIVE METABOLIC PANEL WITH GFR
ALT: 26 U/L (ref 0–44)
AST: 41 U/L (ref 15–41)
Albumin: 3.2 g/dL — ABNORMAL LOW (ref 3.5–5.0)
Alkaline Phosphatase: 137 U/L — ABNORMAL HIGH (ref 38–126)
Anion gap: 10 (ref 5–15)
BUN: 27 mg/dL — ABNORMAL HIGH (ref 8–23)
CO2: 15 mmol/L — ABNORMAL LOW (ref 22–32)
Calcium: 8.7 mg/dL — ABNORMAL LOW (ref 8.9–10.3)
Chloride: 113 mmol/L — ABNORMAL HIGH (ref 98–111)
Creatinine, Ser: 1.23 mg/dL — ABNORMAL HIGH (ref 0.44–1.00)
GFR, Estimated: 48 mL/min — ABNORMAL LOW (ref >60)
Glucose, Bld: 128 mg/dL — ABNORMAL HIGH (ref 70–99)
Potassium: 3.7 mmol/L (ref 3.5–5.1)
Sodium: 139 mmol/L (ref 135–145)
Total Bilirubin: 1.8 mg/dL — ABNORMAL HIGH (ref 0.0–1.2)
Total Protein: 6 g/dL — ABNORMAL LOW (ref 6.5–8.1)

## 2023-12-20 LAB — AMMONIA: Ammonia: 126 umol/L — ABNORMAL HIGH (ref 9–35)

## 2023-12-20 MED ORDER — ROPINIROLE HCL 0.25 MG PO TABS
0.2500 mg | ORAL_TABLET | Freq: Every day | ORAL | Status: DC
  Administered 2023-12-20 – 2023-12-27 (×8): 0.25 mg via ORAL
  Filled 2023-12-20 (×8): qty 1

## 2023-12-20 MED ORDER — SPIRONOLACTONE 25 MG PO TABS
50.0000 mg | ORAL_TABLET | Freq: Every day | ORAL | Status: DC
  Administered 2023-12-21 – 2023-12-27 (×7): 50 mg via ORAL
  Filled 2023-12-20 (×6): qty 2

## 2023-12-20 MED ORDER — HEPARIN SODIUM (PORCINE) 5000 UNIT/ML IJ SOLN
5000.0000 [IU] | Freq: Three times a day (TID) | INTRAMUSCULAR | Status: DC
  Administered 2023-12-20 – 2023-12-21 (×3): 5000 [IU] via SUBCUTANEOUS
  Filled 2023-12-20 (×3): qty 1

## 2023-12-20 MED ORDER — FUROSEMIDE 40 MG PO TABS
40.0000 mg | ORAL_TABLET | Freq: Every day | ORAL | Status: DC
  Administered 2023-12-21 – 2023-12-28 (×8): 40 mg via ORAL
  Filled 2023-12-20 (×8): qty 1

## 2023-12-20 MED ORDER — ATORVASTATIN CALCIUM 40 MG PO TABS
40.0000 mg | ORAL_TABLET | Freq: Every day | ORAL | Status: DC
  Administered 2023-12-21 – 2023-12-28 (×8): 40 mg via ORAL
  Filled 2023-12-20 (×8): qty 1

## 2023-12-20 MED ORDER — VENLAFAXINE HCL ER 75 MG PO CP24
75.0000 mg | ORAL_CAPSULE | Freq: Every day | ORAL | Status: DC
  Administered 2023-12-21 – 2023-12-28 (×8): 75 mg via ORAL
  Filled 2023-12-20 (×8): qty 1

## 2023-12-20 MED ORDER — MECLIZINE HCL 25 MG PO TABS: 25.0000 mg | ORAL_TABLET | Freq: Three times a day (TID) | ORAL | Status: DC | PRN

## 2023-12-20 MED ORDER — ACETAMINOPHEN 500 MG PO TABS
500.0000 mg | ORAL_TABLET | Freq: Four times a day (QID) | ORAL | Status: DC | PRN
  Administered 2023-12-20: 500 mg via ORAL
  Filled 2023-12-20: qty 1

## 2023-12-20 MED ORDER — LACTULOSE 10 GM/15ML PO SOLN
10.0000 g | Freq: Three times a day (TID) | ORAL | Status: DC
  Administered 2023-12-20: 10 g via ORAL
  Filled 2023-12-20: qty 30

## 2023-12-20 MED ORDER — PANTOPRAZOLE SODIUM 40 MG PO TBEC
40.0000 mg | DELAYED_RELEASE_TABLET | Freq: Two times a day (BID) | ORAL | Status: DC
  Administered 2023-12-20 – 2023-12-28 (×16): 40 mg via ORAL
  Filled 2023-12-20 (×16): qty 1

## 2023-12-20 MED ORDER — TAPENTADOL HCL ER 100 MG PO TB12: 200.0000 mg | ORAL_TABLET | Freq: Two times a day (BID) | ORAL | Status: DC | PRN

## 2023-12-20 MED ORDER — OXYCODONE HCL 5 MG PO TABS
2.5000 mg | ORAL_TABLET | Freq: Four times a day (QID) | ORAL | Status: DC | PRN
  Administered 2023-12-20 – 2023-12-25 (×8): 2.5 mg via ORAL
  Filled 2023-12-20 (×8): qty 1

## 2023-12-20 MED ORDER — POTASSIUM CHLORIDE CRYS ER 20 MEQ PO TBCR
20.0000 meq | EXTENDED_RELEASE_TABLET | Freq: Two times a day (BID) | ORAL | Status: DC
  Administered 2023-12-20 – 2023-12-28 (×16): 20 meq via ORAL
  Filled 2023-12-20 (×16): qty 1

## 2023-12-20 MED ORDER — FAMOTIDINE 20 MG PO TABS: 20.0000 mg | ORAL_TABLET | Freq: Every day | ORAL | Status: DC | PRN

## 2023-12-20 MED ORDER — PROMETHAZINE HCL 25 MG PO TABS: 12.5000 mg | ORAL_TABLET | Freq: Four times a day (QID) | ORAL | Status: DC | PRN

## 2023-12-20 MED ORDER — RIFAXIMIN 550 MG PO TABS
550.0000 mg | ORAL_TABLET | Freq: Two times a day (BID) | ORAL | Status: DC
  Administered 2023-12-20 – 2023-12-28 (×16): 550 mg via ORAL
  Filled 2023-12-20 (×16): qty 1

## 2023-12-20 MED ORDER — ALBUTEROL SULFATE (2.5 MG/3ML) 0.083% IN NEBU: 2.5000 mg | INHALATION_SOLUTION | Freq: Four times a day (QID) | RESPIRATORY_TRACT | Status: AC | PRN

## 2023-12-20 MED ORDER — LACTULOSE 10 GM/15ML PO SOLN
30.0000 g | Freq: Three times a day (TID) | ORAL | Status: DC
  Administered 2023-12-20 – 2023-12-28 (×16): 30 g via ORAL
  Filled 2023-12-20 (×20): qty 45

## 2023-12-20 MED ORDER — MAGNESIUM GLUCONATE 500 (27 MG) MG PO TABS
1000.0000 mg | ORAL_TABLET | Freq: Every day | ORAL | Status: DC
  Administered 2023-12-21 – 2023-12-28 (×8): 1000 mg via ORAL
  Filled 2023-12-20 (×8): qty 2

## 2023-12-20 MED ORDER — MIDODRINE HCL 5 MG PO TABS
10.0000 mg | ORAL_TABLET | Freq: Three times a day (TID) | ORAL | Status: DC
  Administered 2023-12-20 – 2023-12-28 (×22): 10 mg via ORAL
  Filled 2023-12-20 (×23): qty 2

## 2023-12-20 MED ORDER — DICYCLOMINE HCL 10 MG PO CAPS: 10.0000 mg | ORAL_CAPSULE | Freq: Three times a day (TID) | ORAL | Status: DC | PRN

## 2023-12-20 MED ORDER — VITAMIN B-12 1000 MCG PO TABS
2000.0000 ug | ORAL_TABLET | ORAL | Status: DC
  Administered 2023-12-22 – 2023-12-27 (×3): 2000 ug via ORAL
  Filled 2023-12-20 (×3): qty 2

## 2023-12-20 MED ORDER — LEVOTHYROXINE SODIUM 75 MCG PO TABS
175.0000 ug | ORAL_TABLET | Freq: Every day | ORAL | Status: DC
  Administered 2023-12-21 – 2023-12-28 (×8): 175 ug via ORAL
  Filled 2023-12-20 (×3): qty 1
  Filled 2023-12-20: qty 3
  Filled 2023-12-20: qty 1
  Filled 2023-12-20: qty 3
  Filled 2023-12-20 (×2): qty 1

## 2023-12-20 MED ORDER — MEGESTROL ACETATE 40 MG PO TABS
40.0000 mg | ORAL_TABLET | Freq: Two times a day (BID) | ORAL | Status: DC
  Administered 2023-12-20 – 2023-12-28 (×16): 40 mg via ORAL
  Filled 2023-12-20 (×16): qty 1

## 2023-12-20 NOTE — ED Provider Notes (Signed)
  EMERGENCY DEPARTMENT AT [facility] Provider Note   CSN: [id number] Arrival date & time: 12/20/23  1130     Patient presents with: Altered Mental Status   [name] is a 68 y.o. female.    Altered Mental Status    68 year old female with medical history significant for nonalcoholic cirrhosis, hepatic encephalopathy, PE and DVT, CKD stage III, iron  deficiency anemia, hypothyroidism, short gut syndrome with chronic diarrhea who presents to the emergency department with confusion.  The patient was just discharged from the hospital on Monday.  Her son states that she had initially done well but has now been more confused compared to baseline.  She is moving her bowels and has been compliant with her home lactulose .  No abdominal pain, no genitourinary symptoms.  She had difficulty with her chair at home and could not figure out the button to maneuver her chair and was acutely confused for her son prompting presentation back to the emergency department.  She is unable to do basic tasks of daily living and is not at her baseline.  Alert and oriented to self and place not time.  On arrival thought it was 8 PM.  She arrives GCS 14, ABC intact.  Prior to Admission medications   Medication Sig Start Date End Date Taking? Authorizing Provider  acetaminophen  (TYLENOL ) 500 MG tablet Take 500-1,000 mg by mouth every 6 (six) hours as needed for headache or fever (pain).    [provider]  albuterol  (VENTOLIN  HFA) 108 (90 Base) MCG/ACT inhaler Inhale 2 puffs into the lungs every 6 (six) hours as needed for wheezing or shortness of breath. 11/13/23   [name], MD  atorvastatin  (LIPITOR) 40 MG tablet TAKE 1 TABLET(40 MG) BY MOUTH IN THE MORNING Patient taking differently: Take 40 mg by mouth in the morning. 09/18/23   [name], MD  benzonatate  (TESSALON  PERLES) 100 MG capsule Take 1 capsule (100 mg total) by mouth 3 (three) times daily as needed. Patient  taking differently: Take 100 mg by mouth 3 (three) times daily as needed for cough. 11/13/23   [name], MD  colchicine  0.6 MG tablet Take 0.6 mg by mouth 2 (two) times daily as needed (as directed for gout flares).    [provider]  copper  tablet Take 2 mg by mouth in the morning and at bedtime.    [provider]  cyanocobalamin  (VITAMIN B12) 1000 MCG tablet Take 2,000 mcg by mouth every Monday, Wednesday, and Friday.    [provider]  dicyclomine  (BENTYL ) 10 MG capsule Take 1 capsule (10 mg total) by mouth 3 (three) times daily as needed for spasms (abdominal cramping). 08/07/23   [name], MD  EPINEPHrine  0.3 mg/0.3 mL IJ SOAJ injection Inject 0.3 mg into the muscle as needed for anaphylaxis. 07/27/23   [name], MD  famotidine  (PEPCID ) 20 MG tablet Take 20 mg by mouth daily as needed for heartburn or indigestion.    [provider]  furosemide  (LASIX ) 40 MG tablet Take 1 tablet (40 mg total) by mouth daily. 12/18/23   Jillian Buttery, MD  HYDROcodone -acetaminophen  (NORCO) 10-325 MG tablet Take 0.25-1 tablets by mouth 3 (three) times daily as needed (for pain- may take up to a sum total of 3 tablets/24 hours). 06/15/23   [provider]  IRON -VITAMIN C  PO Take 1 tablet by mouth daily.    [provider]  lactulose  (CHRONULAC ) 10 GM/15ML solution Take 45 mLs (30 g total) by mouth 4 (  four) times daily. Patient taking differently: Take 45 g by mouth See admin instructions. Take 45 grams by mouth in the morning and afternoon 10/23/23   Barbarann Nest, MD  levothyroxine  (SYNTHROID ) 175 MCG tablet TAKE 1 TABLET BY MOUTH DAILY A 6AM. RECHECK TSH WITH PCP IN 4 WEEKS Patient taking differently: Take 175 mcg by mouth daily before breakfast. 09/20/23   [name], MD  magnesium  gluconate (MAGONATE) 500 (27 Mg) MG TABS tablet Take 1 tablet (500 mg total) by mouth in the morning and at bedtime. Patient taking differently: Take 1,000  mg by mouth in the morning. 05/01/23   [name], MD  meclizine  (ANTIVERT ) 25 MG tablet Take 1 tablet (25 mg total) by mouth 3 (three) times daily as needed for dizziness. 12/18/23   Jillian Buttery, MD  megestrol  (MEGACE ) 40 MG tablet Take 1 tablet (40 mg total) by mouth 2 (two) times daily. 05/04/23   Ajewole, [name], MD  methocarbamol  (ROBAXIN ) 500 MG tablet Take 500 mg by mouth in the morning.    [provider]  midodrine  (PROAMATINE ) 10 MG tablet Take 10 mg by mouth 3 (three) times daily.    [provider]  Multiple Vitamins-Minerals (CENTRUM SILVER  ULTRA WOMENS PO) Take 1 tablet by mouth daily with breakfast.    [provider]  naloxone (NARCAN) nasal spray 4 mg/0.1 mL Place 1 spray into the nose as needed (accidental overdose). 03/25/22   [provider]  NUCYNTA ER 200 MG TB12 Take 200 mg by mouth every 12 (twelve) hours as needed (for pain).    [provider]  nystatin  cream (MYCOSTATIN ) Apply 1 Application topically 2 (two) times daily as needed for dry skin. Patient taking differently: Apply 1 Application topically 2 (two) times daily as needed (for irritation). 05/29/23   Cheryle Page, MD  pantoprazole  (PROTONIX ) 40 MG tablet Take 1 tablet (40 mg total) by mouth 2 (two) times daily before a meal. 06/09/23   [name], MD  potassium chloride  (KLOR-CON ) 10 MEQ tablet Take 2 tablets (20 mEq total) by mouth 2 (two) times daily. 08/07/23   [name], MD  rifaximin  (XIFAXAN ) 550 MG TABS tablet Take 550 mg by mouth in the morning and at bedtime.    [provider]  rOPINIRole  (REQUIP ) 0.25 MG tablet TAKE 1 TABLET(0.25 MG) BY MOUTH AT BEDTIME Patient taking differently: Take 0.25 mg by mouth at bedtime. 06/10/23   [name], MD  senna-docusate (SENOKOT-S) 8.6-50 MG tablet Take 1 tablet by mouth 2 (two) times daily. 11/30/23   Duanne Butler DASEN, MD  spironolactone  (ALDACTONE ) 50 MG tablet Take 1 tablet (50 mg total) by  mouth daily. 11/13/23   [name], MD  triamcinolone  cream (KENALOG ) 0.1 % Apply 1 Application topically 2 (two) times daily as needed (skin irritation).    [provider]  venlafaxine  XR (EFFEXOR -XR) 75 MG 24 hr capsule Take 1 capsule (75 mg total) by mouth daily with breakfast. 12/13/23   Duanne Butler DASEN, MD  Vitamin D , Ergocalciferol , (DRISDOL ) 50000 UNITS CAPS Take 50,000 Units by mouth every Wednesday.    [provider]    Allergies: Ace inhibitors, Gadolinium derivatives, Ivp dye [iodinated contrast media], Wound dressing adhesive, Desitin [zinc  oxide], Butrans [buprenorphine], Yellow jacket venom, Hibiclens  [chlorhexidine  gluconate], Penicillins, Vibramycin [doxycycline], and Zofran  [ondansetron ]    Review of Systems  All other systems reviewed and are negative.   Updated Vital Signs BP 127/71   Pulse 84   Temp 98.2 F (36.8 C) (Oral)  Resp 16   Ht 5' 2 (1.575 m)   Wt 95 kg   SpO2 100%   BMI 38.31 kg/m   Physical Exam Vitals and nursing note reviewed.  Constitutional:      General: She is not in acute distress.    Appearance: She is well-developed. She is obese.  HENT:     Head: Normocephalic and atraumatic.  Eyes:     Conjunctiva/sclera: Conjunctivae normal.  Cardiovascular:     Rate and Rhythm: Normal rate and regular rhythm.     Heart sounds: No murmur heard. Pulmonary:     Effort: Pulmonary effort is normal. No respiratory distress.     Breath sounds: Normal breath sounds.  Abdominal:     Palpations: Abdomen is soft.     Tenderness: There is no abdominal tenderness.     Comments: Slight asterixis present  Musculoskeletal:        General: No swelling.     Cervical back: Neck supple.  Skin:    General: Skin is warm and dry.     Capillary Refill: Capillary refill takes less than 2 seconds.  Neurological:     Mental Status: She is alert.     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     (all labs ordered are listed, but only abnormal results are displayed) Labs Reviewed  COMPREHENSIVE METABOLIC PANEL WITH GFR - Abnormal; Notable for the following components:      Result Value   Chloride 113 (*)    CO2 15 (*)    Glucose, Bld 128 (*)    BUN 27 (*)    Creatinine, Ser 1.23 (*)    Calcium  8.7 (*)    Total Protein 6.0 (*)    Albumin  3.2 (*)    Alkaline Phosphatase 137 (*)    Total Bilirubin 1.8 (*)    GFR, Estimated 48 (*)    All other components within normal limits  CBC - Abnormal; Notable for the following components:   Platelets 146 (*)    All other components within normal limits  AMMONIA - Abnormal; Notable for the following components:   Ammonia 126 (*)    All other components within normal limits  URINALYSIS, W/ REFLEX TO CULTURE (INFECTION SUSPECTED)    EKG: None  Radiology: CT Head Wo Contrast Result Date: 12/20/2023 CLINICAL DATA:  L2 altered mental status. EXAM: CT HEAD WITHOUT CONTRAST TECHNIQUE: Contiguous axial images were obtained from the base of the skull through the vertex without intravenous contrast. RADIATION DOSE REDUCTION: This exam was performed according to the departmental dose-optimization program which includes automated exposure control, adjustment of the mA and/or kV according to patient size and/or use of iterative reconstruction technique. COMPARISON:  December 14, 2023 FINDINGS: Brain: There is generalized cerebral atrophy with widening of the extra-axial spaces and ventricular dilatation. There are areas of decreased attenuation within the white matter tracts of the supratentorial brain, consistent with microvascular disease changes. Vascular: Marked severity calcification of the bilateral cavernous carotid arteries, the visualized portion of the left vertebral artery and proximal portion of the basilar artery is  noted. Skull: Normal. Negative for fracture or focal lesion. Sinuses/Orbits: No acute finding. Other: None. IMPRESSION: 1. Generalized cerebral atrophy with chronic white matter small vessel ischemic changes. 2. No acute intracranial  abnormality. Electronically Signed   By: Suzen Dials M.D.   On: 12/20/2023 13:27   DG Chest Portable 1 View Result Date: 12/20/2023 CLINICAL DATA:  Altered mental status. EXAM: PORTABLE CHEST 1 VIEW COMPARISON:  December 14, 2023 FINDINGS: The cardiac silhouette is mildly enlarged and unchanged in size. There is marked severity calcification of the aortic arch. No acute infiltrate, pleural effusion or pneumothorax is identified. The visualized skeletal structures are unremarkable. IMPRESSION: Stable cardiomegaly without acute or active cardiopulmonary disease. Electronically Signed   By: Suzen Dials M.D.   On: 12/20/2023 13:23     Procedures   Medications Ordered in the ED  lactulose  (CHRONULAC ) 10 GM/15ML solution 10 g (has no administration in time range)    Clinical Course as of 12/20/23 1514  Wed Dec 20, 2023  1511 Ammonia(!): 126 [JL]    Clinical Course User Index [JL] Jerrol Agent, MD                                 Medical Decision Making Amount and/or Complexity of Data Reviewed Labs: ordered. Decision-making details documented in ED Course. Radiology: ordered.  Risk Prescription drug management. Decision regarding hospitalization.   68 year old female with medical history significant for nonalcoholic cirrhosis, hepatic encephalopathy, PE and DVT, CKD stage III, iron  deficiency anemia, hypothyroidism, short gut syndrome with chronic diarrhea who presents to the emergency department with confusion.  The patient was just discharged from the hospital on Monday.  Her son states that she had initially done well but has now been more confused compared to baseline.  She is moving her bowels and has been compliant with her home lactulose .   No abdominal pain, no genitourinary symptoms.  She had difficulty with her chair at home and could not figure out the button to maneuver her chair and was acutely confused for her son prompting presentation back to the emergency department.  She is unable to do basic tasks of daily living and is not at her baseline.  Alert and oriented to self and place not time.  On arrival thought it was 8 PM.  She arrives GCS 14, ABC intact.  On arrival, the patient was afebrile, mildly tachycardic heart rate 100, not tachypneic RR 16, hemodynamically stable, saturating well on room air.  On exam the patient had an intact neurologic exam, was alert and oriented x 2, arrived GCS 14, acutely confused, asterixis noted on exam.  Concern for hepatic encephalopathy.  Workup initiated on patient arrival.  CT head: Generalized cerebral atrophy with chronic small vessel ischemic changes with no acute intracranial abnormality  Chest x-ray: Stable cardiomegaly without acute cardiopulmonary disease  Labs: CBC without a leukocytosis or anemia, none anion gap acidosis noted with a bicarbonate of 15, anion gap of 10, creatinine mildly elevated at 1.23, mildly elevated T. bili at 1.8, ammonia was significantly elevated at 126.  Urinalysis was ordered and pending.  In the setting of the patient's asterixis, acute encephalopathy and ammonia of 126 in the with known nonalcoholic cirrhosis, concern for recurrent hepatic encephalopathy.  Lactulose  was ordered and hospitalist medicine was consulted for admission.        Final diagnoses:  Hepatic encephalopathy [facility])    ED Discharge Orders     None          Jerrol Agent, MD 12/20/23 1514

## 2023-12-20 NOTE — Plan of Care (Signed)

## 2023-12-20 NOTE — ED Provider Notes (Signed)
 3:24 PM Assumed care of patient from off-going team. For more details, please see note from same day.  In brief, this is a 68 y.o. female w/ h/o hepatic encephalopathy, recently admitted for the same, acutely confused. Urine pending.   Plan/Dispo at time of sign-out & ED Course since sign-out: [ ]  admit  BP 127/71   Pulse 84   Temp 98.2 F (36.8 C) (Oral)   Resp 16   Ht 5' 2 (1.575 m)   Wt 95 kg   SpO2 100%   BMI 38.31 kg/m    ED Course:   Clinical Course as of 12/20/23 1524  Wed Dec 20, 2023  1511 Ammonia(!): 126 [JL]    Clinical Course User Index [JL] Jerrol Agent, MD   Discussed with hospitalist Dr. Raenelle who will admit patient to the hospital.   Dispo: Admitted to hospitalist ------------------------------- Sid Boning, MD Emergency Medicine  This note was created using dictation software, which may contain spelling or grammatical errors.   Boning Sid SAILOR, MD 12/23/23 [id number]

## 2023-12-20 NOTE — ED Triage Notes (Addendum)
 Patient was discharged on Monday. Family said she was not ready for discharge. Is back to being confused. Usually has high ammonia when she gets confused. Family said patient is weaker than normal. Cannot remember how to operate her recliner or basic tasks. Alert to self and place. Not time. Thinks its 2055.

## 2023-12-20 NOTE — ED Notes (Signed)
 EDP at [facility], speaking with pt and family.

## 2023-12-20 NOTE — H&P (Signed)
 History and Physical    [name] FMW:[id number] DOB: [birth year] DOA: 12/20/2023  PCP: [name], MD  Patient coming from: Home  I have personally briefly reviewed patient's old medical records available.   Chief Complaint: Confusion, tremors and lethargy.  HPI: [name] is a 68 y.o. female with medical history significant of [name] with recurrent hepatic encephalopathy, discharged 2 days ago, CKD stage IIIa, iron -deficiency anemia, chronic thrombocytopenia, hypothyroidism, generalized anxiety disorder, restless leg syndrome, chronic abdominal pain on long-acting opiates and short acting oxycodone  who went home 2 days ago treated for similar episode, might have taken full dose of oxycodone  and had less bowel movements.  Today, she was noted to be so tired that she could not get out of the bed and could not remember how to function her wheelchair so her son brought her back to the ER.  Patient lives at home with her son.  Known to this provider from previous admissions.  Walks around with a walker.  Son monitors her mental status very closely and adjusts her lactulose  doses.  She usually takes 45 g lactulose  twice daily and finishes in the daytime to avoid nighttime diarrhea.  At her usual self, patient is alert awake and she tells me she fixed Thanksgiving dinner also. Patient tells me she feels tired but denies any complaints.  She denies any nausea vomiting headache.  Denies any chest pain or shortness of breath.  Patient is mostly alert but looks tired.  Last bowel movement was today morning that was very small volume as per her son.  Son gives most of the history.  He was noticing bad ammonia smell on her stool since yesterday. ED Course: Hemodynamically stable.  On room air.  Blood pressure stable.  Ammonia 128.  CT head without any acute findings.  Patient noted to have asterixis.  Admission requested due to significant symptoms.  She was given 10 g of lactulose  in the ER  x 1.  Review of Systems: all systems are reviewed and pertinent positive as per HPI otherwise rest are negative.    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   Hyperammonemia 09/30/2022   Hypertension    Kidney stone    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   Obesity    Pulmonary emboli (HCC) 01/10/2009   Pulmonary embolism (HCC)    Short gut syndrome    Thyroid  disease     Past Surgical History:  Procedure Laterality Date   ABDOMINAL WALL MESH  REMOVAL     2016   CESAREAN SECTION WITH BILATERAL TUBAL LIGATION  04/30/1985   CHOLECYSTECTOMY  05/2007   Exploratory laparotomy, lysis of adhesions, takedown of ileostomy with small bowel resection, open cholecystectomy and ileocolostomy   COLON SURGERY  01/2007   exploratory laparotomy with right colecotmy and end ileostomy   COLONOSCOPY  04/02/2019   Dr Gunnar Chris Daniels, MD [facility]   ESOPHAGOGASTRODUODENOSCOPY (EGD) WITH PROPOFOL  N/A 10/19/2022  Procedure: ESOPHAGOGASTRODUODENOSCOPY (EGD) WITH PROPOFOL ;  Surgeon: Saintclair Jasper, MD;  Location: [facility];  Service: Gastroenterology;  Laterality: N/A;   ESOPHAGOSCOPY  04/02/2019   EYE SURGERY Left 10/06/2020   Cataract extraciton w/intraocular lens implant- Dr Caresse   HERNIA REPAIR  10/2007   ILEOSTOMY  2009   states bowel perfotation wih colonsocopy   ILEOSTOMY CLOSURE  2009   INCISIONAL HERNIA REPAIR  10/11/2018   IR KYPHO THORACIC WITH BONE BIOPSY   08/31/2022   IR KYPHO THORACIC WITH BONE BIOPSY  10/27/2022   IR RADIOLOGIST EVAL & MGMT  09/27/2022   KIDNEY STONE SURGERY     LIVER RESECTION     2009   PANNICULECTOMY     repair of abdominal wall hernia   WRIST SURGERY     tendonitis    Social history   reports that she quit smoking about 38 years ago. Her smoking use included cigarettes. She has been exposed to tobacco smoke. She has never used smokeless tobacco. She reports that she does not drink alcohol and does not use drugs.  Allergies  Allergen Reactions   Ace Inhibitors Anaphylaxis and Swelling   Gadolinium Derivatives Anaphylaxis   Ivp Dye [Iodinated Contrast Media] Anaphylaxis   Wound Dressing Adhesive Dermatitis   Desitin [Zinc  Oxide] Rash and Other (See Comments)    Worsens rash   Butrans [Buprenorphine] Other (See Comments)    Discontinued due to liver function   Yellow Jacket Venom Swelling   Hibiclens  [Chlorhexidine  Gluconate] Rash and Other (See Comments)    Worsens rash   Penicillins Dermatitis and Rash   Vibramycin [Doxycycline] Rash   Zofran  [Ondansetron ] Rash    Family History  Problem Relation Age of Onset   Uterine cancer Mother    Bone cancer Father    Heart disease Father    Hyperlipidemia Father    Dementia Father    Hypertension Father    Skin cancer Sister    Hypertension Sister    Diabetes Sister    Hyperlipidemia Brother    Hypertension Brother    Heart disease Brother      Prior to Admission medications   Medication Sig Start Date End Date Taking? Authorizing Provider  acetaminophen  (TYLENOL ) 500 MG tablet Take 500-1,000 mg by mouth every 6 (six) hours as needed for headache or fever (pain).    [provider]  albuterol  (VENTOLIN  HFA) 108 (90 Base) MCG/ACT inhaler Inhale 2 puffs into the lungs every 6 (six) hours as needed for wheezing or shortness of breath. 11/13/23   [name], MD  atorvastatin  (LIPITOR) 40 MG tablet TAKE 1 TABLET(40 MG) BY MOUTH IN THE  MORNING Patient taking differently: Take 40 mg by mouth in the morning. 09/18/23   [name], MD  colchicine  0.6 MG tablet Take 0.6 mg by mouth 2 (two) times daily as needed (as directed for gout flares).    [provider]  copper  tablet Take 2 mg by mouth in the morning and at bedtime.    [provider]  cyanocobalamin  (VITAMIN B12) 1000 MCG tablet Take 2,000 mcg by mouth every Monday, Wednesday, and Friday.    [provider]  dicyclomine  (BENTYL ) 10 MG capsule Take 1 capsule (10 mg total) by mouth 3 (three) times daily as needed for spasms (abdominal cramping). 08/07/23   [name], MD  EPINEPHrine  0.3 mg/0.3 mL IJ SOAJ injection Inject 0.3 mg into the muscle as needed for anaphylaxis. 07/27/23   [name], MD  famotidine  (PEPCID ) 20  MG tablet Take 20 mg by mouth daily as needed for heartburn or indigestion.    [provider]  furosemide  (LASIX ) 40 MG tablet Take 1 tablet (40 mg total) by mouth daily. 12/18/23   Jillian Buttery, MD  HYDROcodone -acetaminophen  (NORCO) 10-325 MG tablet Take 0.25-1 tablets by mouth 3 (three) times daily as needed (for pain- may take up to a sum total of 3 tablets/24 hours). 06/15/23   [provider]  IRON -VITAMIN C  PO Take 1 tablet by mouth daily.    [provider]  lactulose  (CHRONULAC ) 10 GM/15ML solution Take 45 mLs (30 g total) by mouth 4 (four) times daily. Patient taking differently: Take 45 g by mouth See admin instructions. Take 45 grams by mouth in the morning and afternoon 10/23/23   Barbarann Nest, MD  levothyroxine  (SYNTHROID ) 175 MCG tablet TAKE 1 TABLET BY MOUTH DAILY A 6AM. RECHECK TSH WITH PCP IN 4 WEEKS Patient taking differently: Take 175 mcg by mouth daily before breakfast. 09/20/23   [name], MD  magnesium  gluconate (MAGONATE) 500 (27 Mg) MG TABS tablet Take 1 tablet (500 mg total) by mouth in the morning and at bedtime. Patient taking differently: Take 1,000 mg by mouth  in the morning. 05/01/23   [name], MD  meclizine  (ANTIVERT ) 25 MG tablet Take 1 tablet (25 mg total) by mouth 3 (three) times daily as needed for dizziness. 12/18/23   Jillian Buttery, MD  megestrol  (MEGACE ) 40 MG tablet Take 1 tablet (40 mg total) by mouth 2 (two) times daily. 05/04/23   Ajewole, [name], MD  methocarbamol  (ROBAXIN ) 500 MG tablet Take 500 mg by mouth in the morning.    [provider]  midodrine  (PROAMATINE ) 10 MG tablet Take 10 mg by mouth 3 (three) times daily.    [provider]  Multiple Vitamins-Minerals (CENTRUM SILVER  ULTRA WOMENS PO) Take 1 tablet by mouth daily with breakfast.    [provider]  naloxone (NARCAN) nasal spray 4 mg/0.1 mL Place 1 spray into the nose as needed (accidental overdose). 03/25/22   [provider]  NUCYNTA ER 200 MG TB12 Take 200 mg by mouth every 12 (twelve) hours as needed (for pain).    [provider]  nystatin  cream (MYCOSTATIN ) Apply 1 Application topically 2 (two) times daily as needed for dry skin. Patient taking differently: Apply 1 Application topically 2 (two) times daily as needed (for irritation). 05/29/23   Cheryle Page, MD  pantoprazole  (PROTONIX ) 40 MG tablet Take 1 tablet (40 mg total) by mouth 2 (two) times daily before a meal. 06/09/23   [name], MD  potassium chloride  (KLOR-CON ) 10 MEQ tablet Take 2 tablets (20 mEq total) by mouth 2 (two) times daily. 08/07/23   [name], MD  rifaximin  (XIFAXAN ) 550 MG TABS tablet Take 550 mg by mouth in the morning and at bedtime.    [provider]  rOPINIRole  (REQUIP ) 0.25 MG tablet TAKE 1 TABLET(0.25 MG) BY MOUTH AT BEDTIME Patient taking differently: Take 0.25 mg by mouth at bedtime. 06/10/23   [name], MD  senna-docusate (SENOKOT-S) 8.6-50 MG tablet Take 1 tablet by mouth 2 (two) times daily. 11/30/23   Duanne Butler DASEN, MD  spironolactone  (ALDACTONE ) 50 MG tablet Take 1 tablet (50 mg total) by mouth daily.  11/13/23   [name], MD  triamcinolone  cream (KENALOG ) 0.1 % Apply 1 Application topically 2 (two) times daily as needed (skin irritation).    [provider]  venlafaxine  XR (EFFEXOR -XR) 75 MG  24 hr capsule Take 1 capsule (75 mg total) by mouth daily with breakfast. 12/13/23   Duanne Butler DASEN, MD  Vitamin D , Ergocalciferol , (DRISDOL ) 50000 UNITS CAPS Take 50,000 Units by mouth every Wednesday.    [provider]    Physical Exam: Vitals:   12/20/23 1138 12/20/23 1139 12/20/23 1442 12/20/23 1445  BP:  129/86 128/73 127/71  Pulse:  100 85 84  Resp:  16 16   Temp:  97.6 F (36.4 C) 98.2 F (36.8 C)   TempSrc:  Oral Oral   SpO2:  100% 100% 100%  Weight: 95 kg     Height: 5' 2 (1.575 m)       Constitutional: NAD, calm, comfortable, chronically sick looking.  Frail and debilitated. Vitals:   12/20/23 1138 12/20/23 1139 12/20/23 1442 12/20/23 1445  BP:  129/86 128/73 127/71  Pulse:  100 85 84  Resp:  16 16   Temp:  97.6 F (36.4 C) 98.2 F (36.8 C)   TempSrc:  Oral Oral   SpO2:  100% 100% 100%  Weight: 95 kg     Height: 5' 2 (1.575 m)      Eyes: PERRL, lids and conjunctivae normal ENMT: Mucous membranes are moist. Posterior pharynx clear of any exudate or lesions.Normal dentition.  Patient has opaque cornea on the left side Neck: normal, supple, no masses, no thyromegaly Respiratory: clear to auscultation bilaterally, no wheezing, no crackles. Normal respiratory effort. No accessory muscle use.  Cardiovascular: Regular rate and rhythm, no murmurs / rubs / gallops.  Trace bilateral extremity edema. 2+ pedal pulses. No carotid bruits.  [place]' sign negative. Abdomen: no tenderness, no masses palpated. No hepatosplenomegaly. Bowel sounds positive.  No fluid thrill palpable. Musculoskeletal: no clubbing / cyanosis. No joint deformity upper and lower extremities. Good ROM, no contractures. Normal muscle tone.  Skin: no rashes, lesions, ulcers. No  induration Neurologic: CN 2-12 grossly intact. Sensation intact, DTR normal. Strength 4/5 in all extremities. Psychiatric: Normal judgment and insight. Alert and mostly oriented.  Falls asleep in between.  Pleasant to interaction.    Labs on Admission: I have personally reviewed following labs and imaging studies  CBC: Recent Labs  Lab 12/14/23 1432 12/15/23 0913 12/16/23 0633 12/20/23 1357  WBC 7.6 7.5 7.9 9.6  NEUTROABS 4.9  --   --   --   HGB 11.8* 12.0 11.8* 12.2  HCT 41.1 37.7 38.8 38.9  MCV 109.3* 99.5 102.1* 99.0  PLT 137* 159 159 146*   Basic Metabolic Panel: Recent Labs  Lab 12/14/23 1432 12/15/23 0655 12/16/23 0633 12/17/23 0549 12/20/23 1357  NA 139 143 144 139 139  K 3.6 3.6 4.2 3.8 3.7  CL 111 116* 118* 111 113*  CO2 18* 19* 17* 20* 15*  GLUCOSE 142* 94 118* 111* 128*  BUN 23 20 21 21  27*  CREATININE 1.16* 0.93 0.96 1.10* 1.23*  CALCIUM  8.7* 8.9 9.0 8.6* 8.7*   GFR: Estimated Creatinine Clearance: 48.4 mL/min (A) (by C-G formula based on SCr of 1.23 mg/dL (H)). Liver Function Tests: Recent Labs  Lab 12/14/23 1432 12/15/23 0655 12/16/23 0633 12/20/23 1357  AST 33 30 35 41  ALT 20 18 19 26   ALKPHOS 126 107 107 137*  BILITOT 1.3* 1.3* 1.6* 1.8*  PROT 5.8* 5.2* 5.7* 6.0*  ALBUMIN  3.1* 2.9* 3.1* 3.2*   No results for input(s): LIPASE, AMYLASE in the last 168 hours. Recent Labs  Lab 12/14/23 1432 12/16/23 9366 12/17/23 0549 12/18/23 0605 12/20/23 1338  AMMONIA 150* 96* 144* 90* 126*   Coagulation Profile: Recent Labs  Lab 12/15/23 0655  INR 1.5*   Cardiac Enzymes: No results for input(s): CKTOTAL, CKMB, CKMBINDEX, TROPONINI in the last 168 hours. BNP (last 3 results) No results for input(s): PROBNP in the last 8760 hours. HbA1C: No results for input(s): HGBA1C in the last 72 hours. CBG: Recent Labs  Lab 12/14/23 1432 12/17/23 1047  GLUCAP 131* 116*   Lipid Profile: No results for input(s): CHOL, HDL,  LDLCALC, TRIG, CHOLHDL, LDLDIRECT in the last 72 hours. Thyroid  Function Tests: No results for input(s): TSH, T4TOTAL, FREET4, T3FREE, THYROIDAB in the last 72 hours. Anemia Panel: No results for input(s): VITAMINB12, FOLATE, FERRITIN, TIBC, IRON , RETICCTPCT in the last 72 hours. Urine analysis:    Component Value Date/Time   COLORURINE YELLOW 12/14/2023 1432   APPEARANCEUR CLEAR 12/14/2023 1432   LABSPEC 1.023 12/14/2023 1432   PHURINE 6.0 12/14/2023 1432   GLUCOSEU NEGATIVE 12/14/2023 1432   HGBUR NEGATIVE 12/14/2023 1432   BILIRUBINUR NEGATIVE 12/14/2023 1432   KETONESUR NEGATIVE 12/14/2023 1432   PROTEINUR NEGATIVE 12/14/2023 1432   UROBILINOGEN 0.2 08/21/2013 0017   NITRITE NEGATIVE 12/14/2023 1432   LEUKOCYTESUR SMALL (A) 12/14/2023 1432    Radiological Exams on Admission: CT Head Wo Contrast Result Date: 12/20/2023 CLINICAL DATA:  L2 altered mental status. EXAM: CT HEAD WITHOUT CONTRAST TECHNIQUE: Contiguous axial images were obtained from the base of the skull through the vertex without intravenous contrast. RADIATION DOSE REDUCTION: This exam was performed according to the departmental dose-optimization program which includes automated exposure control, adjustment of the mA and/or kV according to patient size and/or use of iterative reconstruction technique. COMPARISON:  December 14, 2023 FINDINGS: Brain: There is generalized cerebral atrophy with widening of the extra-axial spaces and ventricular dilatation. There are areas of decreased attenuation within the white matter tracts of the supratentorial brain, consistent with microvascular disease changes. Vascular: Marked severity calcification of the bilateral cavernous carotid arteries, the visualized portion of the left vertebral artery and proximal portion of the basilar artery is noted. Skull: Normal. Negative for fracture or focal lesion. Sinuses/Orbits: No acute finding. Other: None. IMPRESSION: 1.  Generalized cerebral atrophy with chronic white matter small vessel ischemic changes. 2. No acute intracranial abnormality. Electronically Signed   By: Suzen Dials M.D.   On: 12/20/2023 13:27   DG Chest Portable 1 View Result Date: 12/20/2023 CLINICAL DATA:  Altered mental status. EXAM: PORTABLE CHEST 1 VIEW COMPARISON:  December 14, 2023 FINDINGS: The cardiac silhouette is mildly enlarged and unchanged in size. There is marked severity calcification of the aortic arch. No acute infiltrate, pleural effusion or pneumothorax is identified. The visualized skeletal structures are unremarkable. IMPRESSION: Stable cardiomegaly without acute or active cardiopulmonary disease. Electronically Signed   By: Suzen Dials M.D.   On: 12/20/2023 13:23    EKG: Independently reviewed.  EKG 12/80 reviewed, normal sinus rhythm.  QTc 476.  Assessment/Plan Active Problems:   Hepatic encephalopathy (HCC)     1.  NASH cirrhosis with hepatic encephalopathy: Significant symptoms.  Admit.  No evidence of superadded infection.  CT head negative for acute changes. Start lactulose  30 g 3 times daily, continue rifaximin . Patient is euvolemic, will continue with spironolactone , Lasix  with midodrine  support. Work with PT OT. Recheck electrolytes, LFTs and ammonia level tomorrow morning. Minimize opiates.  2.  CKD stage IIIa: At about baseline.  3.  Chronic thrombocytopenia secondary to cirrhosis: Stable.  4.  Chronic pain syndrome: Patient is on  chronic narcotic pain regimen for back pain and abdominal pain.  Continue Nucynta, decrease dose of oxycodone  to 2.5 mg as needed.  Continue on Bentyl .  5.  Hypothyroidism: Continue Synthroid .  Restless leg syndrome, on Requip  continue. Hyperlipidemia, on atorvastatin  continue.  PT OT.  DVT prophylaxis: Heparin  subcu Code Status: DNR with full scope of treatment Family Communication: Son at the bedside Disposition Plan: Home when improved Consults called:  None Admission status: Inpatient.  Telemetry monitor.   Renato Applebaum MD [facility]

## 2023-12-21 ENCOUNTER — Other Ambulatory Visit (HOSPITAL_COMMUNITY): Payer: Self-pay

## 2023-12-21 LAB — AMMONIA: Ammonia: 184 umol/L — ABNORMAL HIGH (ref 9–35)

## 2023-12-21 LAB — CBC
HCT: 33.2 % — ABNORMAL LOW (ref 36.0–46.0)
Hemoglobin: 10.6 g/dL — ABNORMAL LOW (ref 12.0–15.0)
MCH: 30.8 pg (ref 26.0–34.0)
MCHC: 31.9 g/dL (ref 30.0–36.0)
MCV: 96.5 fL (ref 80.0–100.0)
Platelets: 150 K/uL (ref 150–400)
RBC: 3.44 MIL/uL — ABNORMAL LOW (ref 3.87–5.11)
RDW: 15.5 % (ref 11.5–15.5)
WBC: 8.6 K/uL (ref 4.0–10.5)
nRBC: 0 % (ref 0.0–0.2)

## 2023-12-21 LAB — BETA-HYDROXYBUTYRIC ACID: Beta-Hydroxybutyric Acid: 0.12 mmol/L (ref 0.05–0.27)

## 2023-12-21 LAB — COMPREHENSIVE METABOLIC PANEL WITH GFR
ALT: 21 U/L (ref 0–44)
AST: 30 U/L (ref 15–41)
Albumin: 2.8 g/dL — ABNORMAL LOW (ref 3.5–5.0)
Alkaline Phosphatase: 122 U/L (ref 38–126)
Anion gap: 9 (ref 5–15)
BUN: 27 mg/dL — ABNORMAL HIGH (ref 8–23)
CO2: 16 mmol/L — ABNORMAL LOW (ref 22–32)
Calcium: 8.6 mg/dL — ABNORMAL LOW (ref 8.9–10.3)
Chloride: 117 mmol/L — ABNORMAL HIGH (ref 98–111)
Creatinine, Ser: 1.15 mg/dL — ABNORMAL HIGH (ref 0.44–1.00)
GFR, Estimated: 52 mL/min — ABNORMAL LOW (ref >60)
Glucose, Bld: 111 mg/dL — ABNORMAL HIGH (ref 70–99)
Potassium: 3.5 mmol/L (ref 3.5–5.1)
Sodium: 142 mmol/L (ref 135–145)
Total Bilirubin: 1.6 mg/dL — ABNORMAL HIGH (ref 0.0–1.2)
Total Protein: 5.1 g/dL — ABNORMAL LOW (ref 6.5–8.1)

## 2023-12-21 LAB — PROCALCITONIN: Procalcitonin: 0.12 ng/mL

## 2023-12-21 MED ORDER — ACETAMINOPHEN 500 MG PO TABS: 500.0000 mg | ORAL_TABLET | Freq: Four times a day (QID) | ORAL | Status: DC | PRN

## 2023-12-21 MED ORDER — ENOXAPARIN SODIUM 40 MG/0.4ML IJ SOSY
40.0000 mg | PREFILLED_SYRINGE | INTRAMUSCULAR | Status: DC
  Administered 2023-12-21 – 2023-12-27 (×7): 40 mg via SUBCUTANEOUS
  Filled 2023-12-21 (×7): qty 0.4

## 2023-12-21 MED ORDER — SODIUM CHLORIDE 0.9 % IV SOLN
1.0000 g | INTRAVENOUS | Status: DC
  Administered 2023-12-21 – 2023-12-22 (×2): 1 g via INTRAVENOUS
  Filled 2023-12-21 (×2): qty 10

## 2023-12-21 NOTE — Progress Notes (Signed)
 [facility] Progress Note Patient: Kaloni Bisaillon FMW:[id number] DOB: Jul 12, 1955  DOA: 12/20/2023 DOS: the patient was seen and examined on 12/21/2023  Brief Hospital Course: 68 y.o. female with medical history significant of [name], recurrent hepatic encephalopathy, short gut syndrome, chronic diarrhea, CKD 3A, anemia of chronic disease, chronic anxiety with depression, hypothyroidism, recurrent hypotensive episode, hyperlipidemia, GERD, history of kidney stones, history of pulmonary emboli who presents to the ER today complaining of confusion and is being treated for possible hepatic encephalopathy in the setting of NASH cirrhosis.    Assessment and Plan: Acute metabolic encephalopathy secondary to hepatic encephalopathy Ammonia level elevated on admission.  More than 180. Currently no asterixis. Monitor ammonia level. Continue lactulose . Continue rifaximin . No precipitating etiology.  Patient was recently hospitalized for the same.  Will rule out active infection.   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   Headache.   Continue current opioid regimen.   Iron  deficiency anemia H&H relatively stable. Continue PPI.   Chronic hypotension Continue midodrine  with holding parameters.   Hypothyroidism Continue Synthroid    Chronic vaginal bleeding due to fibroids. Continue Megace  40 mg twice daily, Unsure what GYN's plan was since this patient has been on this medication for almost a year now.   Headache. Okay to take Tylenol . Continue as needed pain medication.   Obesity Class 2 Body mass index is 38.47 kg/m.  Placing the pt at higher risk of poor outcomes.   Hypokalemia. Treated.   Mild AKI. With monitor renal function.   Chronic thrombocytopenia  Secondary to cirrhosis.  And marrow suppression in the setting of active infection Monitor.     Subjective: No new complaints of pain.  No nausea no vomiting.  Mentation is still not back to baseline.  No fever no chills.  Physical Exam: Basal crackles. S1-S2 present Abdominal hernia present.  Bowel sound present.  Diffusely tender. Trace bilateral swelling of lower extremities. No asterixis.  Data Reviewed: I have Reviewed nursing notes, Vitals, and Lab results. Since last encounter, pertinent lab results CBC BMP   . I have ordered test including CBC BMP blood cultures urine cultures ammonia  .   Disposition: Status is: Inpatient Remains inpatient appropriate because: Monitor for improvement in mentation  enoxaparin  (LOVENOX ) injection 40 mg Start: 12/21/23 1200   Family Communication: Son at bedside Level of care: Telemetry continue Vitals:   12/21/23 0114 12/21/23 0535 12/21/23 1001 12/21/23 1338  BP: (!) 156/75 (!) 146/76 132/67 136/82  Pulse: 92 92 93 96  Resp: 20 20  18   Temp: 98.9 F (37.2 C) 99.3 F (37.4 C) 98.3 F (36.8 C) 98.4 F (36.9 C)  TempSrc: Oral Oral Oral Oral  SpO2: 98% 97%  100%  Weight:      Height:         Author: Yetta Blanch, MD 12/21/2023 6:52 PM  Please look on [email or web address] to find out who is on call.

## 2023-12-21 NOTE — Evaluation (Signed)
 Physical Therapy Evaluation Patient Details Name: [name] MRN: [id number] DOB: 1955/01/13 Today's Date: 12/21/2023  History of Present Illness  Patient is a 68 y.o. female who presented from home 12/20/23 due to confusion, tremors and lethargy. She was admitted due to hepatic encephalopathy.  Of note, was recently for similar episode. CT head without any acute findings.  PMH:  decompensated cirrhosis, CKD IIIa, thrombocytopenia, hypothyroidism, CAD, RLS, chronic back and abdominal pain, short gut syndrome, iron  deficiency anemia, cirrhosis, and  blind in L eye.  Clinical Impression  Pt admitted with above diagnosis. PTA, pt reports son assisting as needed with self care, using RW or bil hand held to son for in home ambulation, past success with HHPT, activity level varies due to ammonia level fluctuating. On eval, pt pleasant, follows commands, needing assist to reports name of hospital and current date, son at bedside reports cognition not at baseline. Pt demonstrates generalized weakness, increased effort and time to mobilize to bedside, using bedrail and elevated HOB to assist as needed. Pt powers to stand with min A, BLE braced against front of bed, BUE assisting to power up. Pt with limited amb in room, fatigues after ~3 ft forward so then takes steps back to foot of bed, able to maintain standing to sidestep up to [facility] with bed positioned behind, min A to steady and support with gait. Son at bedside during eval, reports plan is for pt to return home with continued assist as needed, reports past success with HHPT. Recommend HHPT at d/c. Pt currently with functional limitations due to the deficits listed below (see PT Problem List). Pt will benefit from acute skilled PT to increase their independence and safety with mobility to allow discharge.           If plan is discharge home, recommend the following: A little help with walking and/or transfers;A little help with  bathing/dressing/bathroom;Assistance with cooking/housework;Assist for transportation;Help with stairs or ramp for entrance   Can travel by private vehicle        Equipment Recommendations None recommended by PT  Recommendations for Other Services       Functional Status Assessment Patient has had a recent decline in their functional status and demonstrates the ability to make significant improvements in function in a reasonable and predictable amount of time.     Precautions / Restrictions Precautions Precautions: Fall Precaution/Restrictions Comments: L eye blind Restrictions Weight Bearing Restrictions Per Provider Order: No      Mobility  Bed Mobility Overal bed mobility: Needs Assistance Bed Mobility: Supine to Sit, Sit to Supine     Supine to sit: Contact guard, HOB elevated, Used rails Sit to supine: Contact guard assist, Used rails   General bed mobility comments: strong use of BUE on bedrail to mobilize to bedside, slowly able to lift BLE Back into bed and reposition to comfort with bedrail use, therapist managing linen    Transfers Overall transfer level: Needs assistance Equipment used: Rolling walker (2 wheels) Transfers: Sit to/from Stand Sit to Stand: Min assist           General transfer comment: min A to power to stand, BUE assisting to rise, BLE braced against front of bed    Ambulation/Gait Ambulation/Gait assistance: Min assist Gait Distance (Feet): 6 Feet Assistive device: Rolling walker (2 wheels)   Gait velocity: decreased     General Gait Details: near shuffling step progression forward ~3 ft, then reports fatigued so takes steps back to bedside, able to take  sidesteps up in to [facility], forward flexed trunk, assist to manage RW appropriately, min A to steady  Stairs            Wheelchair Mobility     Tilt Bed    Modified Rankin (Stroke Patients Only)       Balance Overall balance assessment: Needs  assistance Sitting-balance support: Feet supported Sitting balance-Leahy Scale: Fair Sitting balance - Comments: not challenged   Standing balance support: During functional activity, Bilateral upper extremity supported, Reliant on assistive device for balance Standing balance-Leahy Scale: Poor                               Pertinent Vitals/Pain Pain Assessment Pain Assessment: Faces Faces Pain Scale: Hurts little more Pain Location: L knee and back Pain Descriptors / Indicators: Discomfort (bone on bone) Pain Intervention(s): Limited activity within patient's tolerance, Monitored during session, Repositioned    Home Living Family/patient expects to be discharged to:: Private residence Living Arrangements: Children Available Help at Discharge: Family;Available 24 hours/day Type of Home: House Home Access: Ramped entrance       Home Layout: One level Home Equipment: Rollator (4 wheels);Rolling Walker (2 wheels);BSC/3in1;Other (comment);Hospital bed;Wheelchair - manual;Adaptive equipment;Hand held shower head Additional Comments: Pt lives with son [name] - very supportive, provides assist as needed; [name] present bedside for evaluation    Prior Function Prior Level of Function : Needs assist       Physical Assist : ADLs (physical);Mobility (physical)     Mobility Comments: son typically present for all mobility; uses RW in home or holds to son's hands for in home ambulation, manual WC outside home ADLs Comments: son assisting wtih ADLs as needed     Extremity/Trunk Assessment   Upper Extremity Assessment Upper Extremity Assessment: Defer to OT evaluation    Lower Extremity Assessment Lower Extremity Assessment: Generalized weakness (AROM WFL, strength grossly 3+/5, denies numbness/tingling throughout BLE)    Cervical / Trunk Assessment Cervical / Trunk Assessment: Kyphotic  Communication   Communication Communication: No apparent difficulties     Cognition Arousal: Alert Behavior During Therapy: WFL for tasks assessed/performed                           PT - Cognition Comments: son at bedside reports pt's cognition not at baseline; pt able to state name, DOB, able to talk through the date and name of hospital with assistance; pleasant and follows commands Following commands: Intact       Cueing Cueing Techniques: Verbal cues     General Comments General comments (skin integrity, edema, etc.): HR 90-110s on RA during eval    Exercises     Assessment/Plan    PT Assessment Patient needs continued PT services  PT Problem List Decreased activity tolerance;Decreased balance;Decreased mobility;Decreased strength;Decreased cognition;Decreased knowledge of use of DME;Decreased safety awareness       PT Treatment Interventions DME instruction;Gait training;Functional mobility training;Therapeutic activities;Therapeutic exercise;Balance training;Neuromuscular re-education;Patient/family education    PT Goals (Current goals can be found in the Care Plan section)  Acute Rehab PT Goals Patient Stated Goal: return home with son assisting as needed PT Goal Formulation: With patient/family Time For Goal Achievement: 01/04/24 Potential to Achieve Goals: Good    Frequency Min 2X/week     Co-evaluation               AM-PAC PT 6 Clicks Mobility  Outcome Measure  Help needed turning from your back to your side while in a flat bed without using bedrails?: A Little Help needed moving from lying on your back to sitting on the side of a flat bed without using bedrails?: A Little Help needed moving to and from a bed to a chair (including a wheelchair)?: A Little Help needed standing up from a chair using your arms (e.g., wheelchair or bedside chair)?: A Little Help needed to walk in hospital room?: A Lot Help needed climbing 3-5 steps with a railing? : Total 6 Click Score: 15    End of Session Equipment Utilized  During Treatment: Gait belt Activity Tolerance: Patient tolerated treatment well Patient left: in bed;with call bell/phone within reach;with family/visitor present;with bed alarm set Nurse Communication: Mobility status PT Visit Diagnosis: Unsteadiness on feet (R26.81);Muscle weakness (generalized) (M62.81);Other abnormalities of gait and mobility (R26.89)    Time: 8976-8952 PT Time Calculation (min) (ACUTE ONLY): 24 min   Charges:   PT Evaluation $PT Eval Moderate Complexity: 1 Mod   PT General Charges $$ ACUTE PT VISIT: 1 Visit         Tori Daylen Lipsky PT, DPT 12/21/2023, 11:00 AM

## 2023-12-21 NOTE — Evaluation (Signed)
 Occupational Therapy Evaluation Patient Details Name: [name] MRN: [id number] DOB: November 20, 1955 Today's Date: 12/21/2023   History of Present Illness   Patient is a 68 y.o. female who presented from home 12/20/23 due to confusion, tremors and lethargy. She was admitted due to hepatic encephalopathy.  Of note, was recently for similar episode. CT head without any acute findings.  PMH:  decompensated cirrhosis, CKD IIIa, thrombocytopenia, hypothyroidism, CAD, RLS, chronic back and abdominal pain, short gut syndrome, iron  deficiency anemia, cirrhosis, and  blind in L eye.     Clinical Impressions Pt at first did not want to work with therapist but then was agreeable. She needed increase in time for all tasks and cues to intiate. She completed light oral care and face washing at EOB but needed assist with coordination of tasks and sequencing (was trying to place toothpaste on toothbrush without opening and upside down). She then agreed to complete sit to stand with light min assist and min assist to direct walker but reported she needed to urinate and then step pivoted to [facility] with min assist and required max-total assist for peri care. At this time recommendation for [facility] assist with son's assist.      If plan is discharge home, recommend the following:         Functional Status Assessment   Patient has had a recent decline in their functional status and demonstrates the ability to make significant improvements in function in a reasonable and predictable amount of time.     Equipment Recommendations   None recommended by OT     Recommendations for Other Services         Precautions/Restrictions   Precautions Precautions: Fall Precaution/Restrictions Comments: L eye blind Restrictions Weight Bearing Restrictions Per Provider Order: No     Mobility Bed Mobility Overal bed mobility: Needs Assistance Bed Mobility: Supine to Sit, Sit to Supine     Supine to sit:  Contact guard, Min assist Sit to supine: Contact guard assist, Min assist   General bed mobility comments: needs increase in time and unaware about how to position self to decrease in lateral lean without cues or light tactile touch    Transfers Overall transfer level: Needs assistance Equipment used: Rolling walker (2 wheels) Transfers: Sit to/from Stand Sit to Stand: Contact guard assist, Min assist           General transfer comment: very light min assist      Balance Overall balance assessment: Needs assistance Sitting-balance support: Feet supported Sitting balance-Leahy Scale: Fair Sitting balance - Comments: L lateral lean Postural control: Left lateral lean Standing balance support: Bilateral upper extremity supported, Reliant on assistive device for balance Standing balance-Leahy Scale: Poor                             ADL either performed or assessed with clinical judgement   ADL Overall ADL's : Needs assistance/impaired Eating/Feeding: Minimal assistance;Sitting Eating/Feeding Details (indicate cue type and reason): needed cup placed into hand Grooming: Minimal assistance Grooming Details (indicate cue type and reason): needs toothpaste place on brush and placed in coorect orientation Upper Body Bathing: Minimal assistance;Sitting       Upper Body Dressing : Minimal assistance;Sitting       Toilet Transfer: Contact guard assist;Minimal assistance;Rolling walker (2 wheels)   Toileting- Clothing Manipulation and Hygiene: Maximal assistance;Sit to/from stand       Functional mobility during ADLs: Minimal assistance;Rolling walker (2  wheels);Cueing for sequencing;Cueing for safety       Vision Baseline Vision/History: 2 Legally blind (L eye) Ability to See in Adequate Light: 2 Moderately impaired       Perception Perception:  (difficult to fully assess is it cognition vs vision as well)       Praxis         Pertinent Vitals/Pain  Pain Assessment Pain Assessment: Faces Faces Pain Scale: Hurts a little bit Pain Location: general aching Pain Descriptors / Indicators: Discomfort, Aching Pain Intervention(s): Limited activity within patient's tolerance, Monitored during session, Repositioned     Extremity/Trunk Assessment Upper Extremity Assessment Upper Extremity Assessment: Generalized weakness   Lower Extremity Assessment Lower Extremity Assessment: Generalized weakness (AROM WFL, strength grossly 3+/5, denies numbness/tingling throughout BLE)   Cervical / Trunk Assessment Cervical / Trunk Assessment: Kyphotic   Communication Communication Communication: No apparent difficulties   Cognition Arousal: Alert Behavior During Therapy: WFL for tasks assessed/performed Cognition: History of cognitive impairments             OT - Cognition Comments: Per chart review pt is still not at baseline of congnition. Pt reported they in [facility] ED  and was unclear why they were here. She needed increase in time to process, sequence, intiate into tasks.                 Following commands: Intact (slowly)       Cueing  General Comments   Cueing Techniques: Verbal cues  HR100-112 on RA   Exercises     Shoulder Instructions      Home Living Family/patient expects to be discharged to:: Private residence Living Arrangements: Children Available Help at Discharge: Family;Available 24 hours/day Type of Home: House Home Access: Ramped entrance     Home Layout: One level     Bathroom Shower/Tub: Walk-in shower;Sponge bathes at baseline   Bathroom Toilet: Standard Bathroom Accessibility: Yes How Accessible: Accessible via walker Home Equipment: Rollator (4 wheels);Rolling Walker (2 wheels);BSC/3in1;Other (comment);Hospital bed;Wheelchair - manual;Adaptive equipment;Hand held Clinical Biochemist: [employer] Additional Comments: Pt lives with son [name] - very supportive, provides assist as  needed; [name] present bedside for evaluation      Prior Functioning/Environment Prior Level of Function : Needs assist       Physical Assist : ADLs (physical);Mobility (physical)     Mobility Comments: son typically present for all mobility; uses RW in home or holds to son's hands for in home ambulation, manual WC outside home ADLs Comments: son assisting wtih ADLs as needed    OT Problem List: Decreased activity tolerance;Decreased strength;Impaired balance (sitting and/or standing);Cardiopulmonary status limiting activity   OT Treatment/Interventions: Self-care/ADL training;Therapeutic exercise;Energy conservation;DME and/or AE instruction;Therapeutic activities;Patient/family education;Balance training      OT Goals(Current goals can be found in the care plan section)   Acute Rehab OT Goals Patient Stated Goal: none OT Goal Formulation: Patient unable to participate in goal setting Time For Goal Achievement: 01/04/24 Potential to Achieve Goals: Good   OT Frequency:  Min 2X/week    Co-evaluation              AM-PAC OT 6 Clicks Daily Activity     Outcome Measure Help from another person eating meals?: A Little Help from another person taking care of personal grooming?: A Little Help from another person toileting, which includes using toliet, bedpan, or urinal?: A Little Help from another person bathing (including washing, rinsing, drying)?: A Lot Help from another person to put  on and taking off regular upper body clothing?: A Little Help from another person to put on and taking off regular lower body clothing?: A Lot 6 Click Score: 16   End of Session Equipment Utilized During Treatment: Gait belt;Rolling walker (2 wheels) Nurse Communication: Mobility status  Activity Tolerance: Patient tolerated treatment well Patient left: in bed;with call bell/phone within reach;with bed alarm set  OT Visit Diagnosis: Muscle weakness (generalized) (M62.81);Unsteadiness on  feet (R26.81)                Time: 8770-8745 OT Time Calculation (min): 25 min Charges:  OT General Charges $OT Visit: 1 Visit OT Evaluation $OT Eval Low Complexity: 1 Low OT Treatments $Self Care/Home Management : 8-22 mins  Warrick POUR OTR/L  Acute Rehab Services  [phone] office number   Warrick Berber 12/21/2023, 1:10 PM

## 2023-12-22 ENCOUNTER — Inpatient Hospital Stay (HOSPITAL_COMMUNITY): Payer: Medicare (Managed Care)

## 2023-12-22 DIAGNOSIS — K7682 Hepatic encephalopathy: Secondary | ICD-10-CM | POA: Diagnosis not present

## 2023-12-22 LAB — COMPREHENSIVE METABOLIC PANEL WITH GFR
ALT: 23 U/L (ref 0–44)
AST: 43 U/L — ABNORMAL HIGH (ref 15–41)
Albumin: 2.8 g/dL — ABNORMAL LOW (ref 3.5–5.0)
Alkaline Phosphatase: 116 U/L (ref 38–126)
Anion gap: 10 (ref 5–15)
BUN: 23 mg/dL (ref 8–23)
CO2: 15 mmol/L — ABNORMAL LOW (ref 22–32)
Calcium: 8.4 mg/dL — ABNORMAL LOW (ref 8.9–10.3)
Chloride: 113 mmol/L — ABNORMAL HIGH (ref 98–111)
Creatinine, Ser: 1.1 mg/dL — ABNORMAL HIGH (ref 0.44–1.00)
GFR, Estimated: 55 mL/min — ABNORMAL LOW (ref >60)
Glucose, Bld: 94 mg/dL (ref 70–99)
Potassium: 3.8 mmol/L (ref 3.5–5.1)
Sodium: 139 mmol/L (ref 135–145)
Total Bilirubin: 1.4 mg/dL — ABNORMAL HIGH (ref 0.0–1.2)
Total Protein: 5.1 g/dL — ABNORMAL LOW (ref 6.5–8.1)

## 2023-12-22 LAB — CBC WITH DIFFERENTIAL/PLATELET
Abs Immature Granulocytes: 0.02 K/uL (ref 0.00–0.07)
Basophils Absolute: 0.1 K/uL (ref 0.0–0.1)
Basophils Relative: 1 %
Eosinophils Absolute: 1.2 K/uL — ABNORMAL HIGH (ref 0.0–0.5)
Eosinophils Relative: 15 %
HCT: 35.1 % — ABNORMAL LOW (ref 36.0–46.0)
Hemoglobin: 11.1 g/dL — ABNORMAL LOW (ref 12.0–15.0)
Immature Granulocytes: 0 %
Lymphocytes Relative: 25 %
Lymphs Abs: 2 K/uL (ref 0.7–4.0)
MCH: 31.2 pg (ref 26.0–34.0)
MCHC: 31.6 g/dL (ref 30.0–36.0)
MCV: 98.6 fL (ref 80.0–100.0)
Monocytes Absolute: 0.6 K/uL (ref 0.1–1.0)
Monocytes Relative: 7 %
Neutro Abs: 4.2 K/uL (ref 1.7–7.7)
Neutrophils Relative %: 52 %
Platelets: 147 K/uL — ABNORMAL LOW (ref 150–400)
RBC: 3.56 MIL/uL — ABNORMAL LOW (ref 3.87–5.11)
RDW: 15.4 % (ref 11.5–15.5)
WBC: 8.1 K/uL (ref 4.0–10.5)
nRBC: 0 % (ref 0.0–0.2)

## 2023-12-22 LAB — MAGNESIUM: Magnesium: 1.9 mg/dL (ref 1.7–2.4)

## 2023-12-22 LAB — URINE CULTURE: Culture: >=100000 — AB

## 2023-12-22 LAB — AMMONIA: Ammonia: 151 umol/L — ABNORMAL HIGH (ref 9–35)

## 2023-12-22 NOTE — Progress Notes (Signed)
 [facility] Progress Note Patient: [name] FMW:[id number] DOB: 01/10/1956  DOA: 12/20/2023 DOS: the patient was seen and examined on 12/22/2023  Brief Hospital Course: 68 y.o. female with medical history significant of [name], recurrent hepatic encephalopathy, short gut syndrome, chronic diarrhea, CKD 3A, anemia of chronic disease, chronic anxiety with depression, hypothyroidism, recurrent hypotensive episode, hyperlipidemia, GERD, history of kidney stones, history of pulmonary emboli who presents to the ER today complaining of confusion and is being treated for possible hepatic encephalopathy in the setting of NASH cirrhosis.    Assessment and Plan: Acute metabolic encephalopathy secondary to hepatic encephalopathy Ammonia level elevated on admission.  More than 180. Currently no asterixis. Monitor ammonia level. Continue lactulose . Continue rifaximin . Likely related to E. coli UTI.  E. coli UTI. Currently on IV ceftriaxone . Follow-up on blood cultures if negative transition to oral antibiotic.   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   Headache.   Continue current opioid regimen.   Iron  deficiency anemia H&H relatively stable. Continue PPI.   Chronic hypotension Continue midodrine  with holding parameters.   Hypothyroidism Continue Synthroid    Chronic vaginal bleeding due to fibroids. Continue Megace  40 mg twice daily, Unsure what GYN's plan was since this patient has been on this medication for almost a year now.   Headache. Okay to take Tylenol . Continue as needed pain medication.   Obesity Class 2 Body mass index is 38.31 kg/m.  Placing the pt at higher risk of poor outcomes.   Hypokalemia. Treated.   Mild AKI. With monitor renal function.   Chronic thrombocytopenia  Secondary to cirrhosis.  And marrow suppression in the setting  of active infection Monitor.    Right foot pain with bruising. X-ray ordered to ensure there is no fracture. Continue pain control.  No evidence of gout.   Subjective: Ammonia level gradually improving.  No nausea no vomiting.  Did not use lactulose  this morning.  Reported severe pain on her great toe on right.  Also son reports some bruising in that area.  Physical Exam: Clear to auscultation. Bowel sound present. Aortic systolic murmur heard. Diffusely tender abdomen. No significant edema of the lower extremity. Some bruising seen in the right lower extremity.  Pulses present. No asterixis.  Data Reviewed: I have Reviewed nursing notes, Vitals, and Lab results. Since last encounter, pertinent lab results CBC and BMP   . I have ordered test including CBC and BMP  . I have ordered imaging x-ray foot  .   Disposition: Status is: Inpatient Remains inpatient appropriate because: Monitor for improvement in pain  enoxaparin  (LOVENOX ) injection 40 mg Start: 12/21/23 1200   Family Communication: Son at bedside Level of care: Telemetry   Vitals:   12/21/23 1338 12/21/23 2001 12/22/23 0525 12/22/23 1239  BP: 136/82 137/79 121/66 119/77  Pulse: 96 81 76 78  Resp: 18 14 14 18   Temp: 98.4 F (36.9 C) 98.5 F (36.9 C) 98.8 F (37.1 C) 98.9 F (37.2 C)  TempSrc: Oral Oral Oral Oral  SpO2: 100% 100% 100% 100%  Weight:      Height:         Author: Yetta Blanch, MD 12/22/2023 6:52 PM  Please look on [email or web address] to find out who is on call.

## 2023-12-22 NOTE — TOC Initial Note (Signed)
 Transition of Care [facility]) - Initial/Assessment Note    Patient Details  Name: [name] MRN: [id number] Date of Birth: 11-Apr-1955  Transition of Care [facility]) CM/SW Contact:    Toy LITTIE Agar, RN Phone [phone]  12/22/2023, 2:10 PM  Clinical Narrative:                 Inpatient care manager following patient with high risk for readmission. Patient is from home with son. Baseline patient ambulates with walker 1 assist. Patient confirms that she does have a PCP listed in chart and insurance with access to affordable healthcare. Patient is active with [facility] for home health services and wishes to resume services. [facility] has been notified. Currently there are no other health care needs. Inpatient care manager will follow for disposition needs.    Expected Discharge Plan: Home w Home Health Services Barriers to Discharge: Continued Medical Work up   Patient Goals and CMS Choice Patient states their goals for this hospitalization and ongoing recovery are:: To get back home where she can be with her animals   Choice offered to / list presented to : NA [place] ownership interest in [facility].provided to::  (n/a)    Expected Discharge Plan and Services In-house Referral: NA Discharge Planning Services: CM Consult Post Acute Care Choice: Home Health (currently active with [facility] and wwill continue services) Living arrangements for the past 2 months: Single Family Home                 DME Arranged: N/A         HH Arranged: PT, OT, Nurse's Aide (Active, will resume services) HH Agency: [facility] Date HH Agency Contacted: 12/22/23 Time HH Agency Contacted: 1408 Representative spoke with at [facility] Agency: Arna  Prior Living Arrangements/Services Living arrangements for the past 2 months: Single Family Home Lives with:: Adult Children Patient language and need for interpreter reviewed:: Yes Do you feel safe going back to the place where you live?:  Yes      Need for Family Participation in Patient Care: Yes (Comment) Care giver support system in place?: Yes (comment) Current home services: DME, Homehealth aide, Home OT, Home PT (cane, walker, wheelchair, BSC, shower chair, lift recliner, hospital bed, hoyer, scooter, hover round; HHPT/OT/Aide) Criminal Activity/Legal Involvement Pertinent to Current Situation/Hospitalization: No - Comment as needed  Activities of Daily Living   ADL Screening (condition at time of admission) Independently performs ADLs?: Yes (appropriate for developmental age) Is the patient deaf or have difficulty hearing?: No Does the patient have difficulty seeing, even when wearing glasses/contacts?: No Does the patient have difficulty concentrating, remembering, or making decisions?: No  Permission Sought/Granted Permission sought to share information with : Family Supports Permission granted to share information with : Yes, Verbal Permission Granted  Share Information with NAME: Chelby Salata     Permission granted to share info w Relationship: son [phone]  Permission granted to share info w Contact Information: [phone]  Emotional Assessment Appearance:: Appears stated age Attitude/Demeanor/Rapport: Gracious Affect (typically observed): Pleasant Orientation: : Oriented to Self, Oriented to Place, Oriented to  Time, Oriented to Situation Alcohol / Substance Use: Not Applicable Psych Involvement: No (comment)  Admission diagnosis:  Hepatic encephalopathy (HCC) [K76.82] Patient Active Problem List   Diagnosis Date Noted   Staphylococcus epidermidis bacteremia 11/02/2023   Normal anion gap metabolic acidosis 10/24/2023   AMS (altered mental status) 10/08/2023   Arthralgia of left knee 09/08/2023   Acute gout 08/28/2023   Knee  pain 07/03/2023   Hepatic encephalopathy (HCC) 06/29/2023   Chronic kidney disease, stage 3a (HCC) 06/28/2023   Hyperammonemia 05/23/2023   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) 05/23/2023   Chronic idiopathic thrombocytopenia (HCC) 05/23/2023   Hyperlipidemia 05/23/2023   Generalized anxiety disorder 05/23/2023   Chronic anemia 05/23/2023   Acute metabolic encephalopathy 05/23/2023   Copper  deficiency 04/24/2023   Depression 03/30/2023   Restless leg syndrome 03/30/2023   Pruritus 03/06/2023   Edema of both lower legs 03/06/2023   Insomnia 03/06/2023   Compression fracture of body of thoracic vertebra (HCC) 03/06/2023   Anemia of chronic disease 02/22/2023   Lumbar radiculopathy 12/10/2022   Grade I diastolic dysfunction 12/10/2022   History of DVT (deep vein thrombosis) 12/01/2022   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   Acute hepatic encephalopathy (HCC) 10/17/2022   DNR (do not resuscitate)/DNI(Do Not intubate) 10/05/2022   Vaginal bleeding 10/05/2022   Physical deconditioning 08/17/2022   Decompensated cirrhosis (HCC) 08/17/2022   Osteoarthritis of right knee 08/01/2022   Folate deficiency 06/14/2022   Systolic murmur 06/12/2022   Thrombocytopenia (HCC) - Baseline 100-130K 05/16/2022   History of eye surgery 05/01/2022   Corneal abnormality 04/29/2022   Status post eye surgery 04/29/2022   Corneal perforation of left eye 04/18/2022   Pseudophakia of left eye 02/08/2022   Physical debility 12/10/2021   Liver cirrhosis secondary to NASH (HCC) 10/03/2021   Polypharmacy 06/22/2021   Former smoker 01/06/2021   Combined form of age-related cataract, right eye 10/01/2020   Corneal scarring 09/10/2020   Cerebral atherosclerosis 08/20/2020   Short gut syndrome 08/20/2020   DDD (degenerative disc disease), lumbar 08/20/2020   Hernia, ventral 03/06/2020   Splenomegaly 02/06/2019   Vitamin B12 deficiency 01/21/2019   GERD (gastroesophageal reflux disease) 07/25/2016   Hypothyroidism 07/25/2016   Iron  deficiency anemia 05/09/2016   Microcytic anemia 04/12/2016   Vitamin D  deficiency 05/04/2015   History of adenomatous  polyp of colon 04/28/2015   H/O angioedema 11/15/2014   Obesity 07/25/2014   Chronic diarrhea 06/01/2014   PCP:  [name], MD Pharmacy:   [facility] [phone] - [place], [place] - [address] AT [facility] [address] [place] [address] Phone: [phone] Fax: [phone]  [place] [facility] [address] [place] [address] Phone: [phone] Fax: [phone]     Social Drivers of Health (SDOH) Social History: SDOH Screenings   Food Insecurity: No Food Insecurity (12/20/2023)  Housing: Low Risk (12/20/2023)  Transportation Needs: No Transportation Needs (12/20/2023)  Utilities: Not At Risk (12/20/2023)  Alcohol Screen: Low Risk (09/20/2023)  Depression (PHQ2-9): Low Risk (11/07/2023)  Recent Concern: Depression (PHQ2-9) - Medium Risk (09/20/2023)  Financial Resource Strain: Low Risk (09/20/2023)  Physical Activity: Inactive (09/20/2023)  Social Connections: Socially Isolated (12/20/2023)  Stress: No Stress Concern Present (09/20/2023)  Tobacco Use: Medium Risk (12/20/2023)  Health Literacy: Adequate Health Literacy (09/20/2023)   SDOH Interventions:     Readmission Risk Interventions    12/22/2023    1:57 PM 10/31/2023    8:35 AM 10/10/2023    2:52 PM  Readmission Risk Prevention Plan  Transportation Screening Complete Complete Complete  Medication Review Oceanographer) Complete Complete Complete  PCP or Specialist appointment within 3-5 days of discharge Complete  Complete  HRI or Home Care Consult Complete Complete Complete  SW Recovery Care/Counseling Consult Complete Complete Complete  Palliative Care Screening Not Applicable Not Applicable  Not Applicable  Skilled Nursing Facility Not Applicable Not Applicable Not Applicable

## 2023-12-22 NOTE — Care Management Important Message (Signed)
 Important Message  Patient Details  Name: [name] MRN: [id number] Date of Birth: 01-24-1955   Important Message Given:  N/A - LOS <3 / Initial given by admissions     Duwaine LITTIE Ada 12/22/2023, 2:12 PM

## 2023-12-22 NOTE — Hospital Course (Signed)
 35bn with h/o NASH cirrhosis with encephalopathy, short gut syndrome with chronic diarrhea, stage 3a CKD with anemia, hypothyroidism, anxiety/depression, chronic hypotension, and HLD who presented on 12/10 with AMS. Elevated NH4, treated with lactulose  for hepatic encephalopathy.  Complicated by E coli UTI.  Mental status now improving.

## 2023-12-23 LAB — CBC WITH DIFFERENTIAL/PLATELET
Abs Immature Granulocytes: 0.02 K/uL (ref 0.00–0.07)
Basophils Absolute: 0 K/uL (ref 0.0–0.1)
Basophils Relative: 1 %
Eosinophils Absolute: 0.7 K/uL — ABNORMAL HIGH (ref 0.0–0.5)
Eosinophils Relative: 12 %
HCT: 33.9 % — ABNORMAL LOW (ref 36.0–46.0)
Hemoglobin: 10.8 g/dL — ABNORMAL LOW (ref 12.0–15.0)
Immature Granulocytes: 0 %
Lymphocytes Relative: 24 %
Lymphs Abs: 1.5 K/uL (ref 0.7–4.0)
MCH: 30.9 pg (ref 26.0–34.0)
MCHC: 31.9 g/dL (ref 30.0–36.0)
MCV: 97.1 fL (ref 80.0–100.0)
Monocytes Absolute: 0.5 K/uL (ref 0.1–1.0)
Monocytes Relative: 8 %
Neutro Abs: 3.4 K/uL (ref 1.7–7.7)
Neutrophils Relative %: 55 %
Platelets: 127 K/uL — ABNORMAL LOW (ref 150–400)
RBC: 3.49 MIL/uL — ABNORMAL LOW (ref 3.87–5.11)
RDW: 15.6 % — ABNORMAL HIGH (ref 11.5–15.5)
WBC: 6.1 K/uL (ref 4.0–10.5)
nRBC: 0 % (ref 0.0–0.2)

## 2023-12-23 LAB — COMPREHENSIVE METABOLIC PANEL WITH GFR
ALT: 20 U/L (ref 0–44)
AST: 28 U/L (ref 15–41)
Albumin: 2.6 g/dL — ABNORMAL LOW (ref 3.5–5.0)
Alkaline Phosphatase: 118 U/L (ref 38–126)
Anion gap: 8 (ref 5–15)
BUN: 20 mg/dL (ref 8–23)
CO2: 18 mmol/L — ABNORMAL LOW (ref 22–32)
Calcium: 8.1 mg/dL — ABNORMAL LOW (ref 8.9–10.3)
Chloride: 114 mmol/L — ABNORMAL HIGH (ref 98–111)
Creatinine, Ser: 1.12 mg/dL — ABNORMAL HIGH (ref 0.44–1.00)
GFR, Estimated: 54 mL/min — ABNORMAL LOW (ref >60)
Glucose, Bld: 118 mg/dL — ABNORMAL HIGH (ref 70–99)
Potassium: 3.8 mmol/L (ref 3.5–5.1)
Sodium: 140 mmol/L (ref 135–145)
Total Bilirubin: 0.9 mg/dL (ref 0.0–1.2)
Total Protein: 4.8 g/dL — ABNORMAL LOW (ref 6.5–8.1)

## 2023-12-23 LAB — AMMONIA: Ammonia: 154 umol/L — ABNORMAL HIGH (ref 9–35)

## 2023-12-23 LAB — MAGNESIUM: Magnesium: 1.7 mg/dL (ref 1.7–2.4)

## 2023-12-23 MED ORDER — CEFADROXIL 500 MG PO CAPS
500.0000 mg | ORAL_CAPSULE | Freq: Two times a day (BID) | ORAL | Status: DC
  Administered 2023-12-24 – 2023-12-28 (×9): 500 mg via ORAL
  Filled 2023-12-23 (×9): qty 1

## 2023-12-23 NOTE — Plan of Care (Signed)

## 2023-12-23 NOTE — Progress Notes (Signed)
 Mobility Specialist Progress Note:   12/23/23 1555  Mobility  Activity Ambulated with assistance  Level of Assistance Contact guard assist, steadying assist  Assistive Device Front wheel walker  Distance Ambulated (ft) 80 ft  Activity Response Tolerated well  Mobility Referral Yes  Mobility visit 1 Mobility  Mobility Specialist Start Time (ACUTE ONLY) 1535  Mobility Specialist Stop Time (ACUTE ONLY) 1553  Mobility Specialist Time Calculation (min) (ACUTE ONLY) 18 min   Pt was received in bed and agreed to mobility. Slight fatigue towards end of session but recovered quickly. Returned to bed with all needs met. Call bell in reach.  [employer] - Mobility Specialist

## 2023-12-23 NOTE — Progress Notes (Signed)
 [facility] Progress Note Patient: [name] FMW:[id number] DOB: 03/02/1955  DOA: 12/20/2023 DOS: the patient was seen and examined on 12/23/2023  Brief Hospital Course: 68 y.o. female with medical history significant of [name], recurrent hepatic encephalopathy, short gut syndrome, chronic diarrhea, CKD 3A, anemia of chronic disease, chronic anxiety with depression, hypothyroidism, recurrent hypotensive episode, hyperlipidemia, GERD, history of kidney stones, history of pulmonary emboli who presents to the ER today complaining of confusion and is being treated for possible hepatic encephalopathy in the setting of NASH cirrhosis.    Assessment and Plan: Acute metabolic encephalopathy secondary to hepatic encephalopathy Ammonia level elevated on admission.  More than 180. Currently no asterixis. Monitor ammonia level.  Still remains elevated. Continue lactulose . Continue rifaximin . Likely related to E. coli UTI.  E. coli UTI. Currently on IV ceftriaxone .  Switching to oral cefadroxil . Follow-up on blood cultures.  So far negative for 48 hours.   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   Continue current opioid regimen.   Iron  deficiency anemia H&H relatively stable. Continue PPI.   Chronic hypotension Continue midodrine    Hypothyroidism Continue Synthroid    Chronic vaginal bleeding due to fibroids. Continue Megace  40 mg twice daily, Patient follow-up with GYN.   Headache. Okay to take Tylenol .   Obesity Class 2 Body mass index is 38.31 kg/m.  Placing the pt at higher risk of poor outcomes.   Hypokalemia. Treated.   Mild AKI. Baseline creatinine 0.9.  Creatinine on admission 1.2.  Improving back to 1.1. monitor renal function.   Chronic thrombocytopenia  Secondary to cirrhosis.  Monitor.    Right foot pain with bruising. X-ray negative  for any fracture. Continue pain control.  No evidence of gout.   Subjective: No nausea no vomiting no fever no chills.  Pain unchanged.  Physical Exam: Basal crackles. S1-S2 present. No edema.  Data Reviewed: I have Reviewed nursing notes, Vitals, and Lab results. Since last encounter, pertinent lab results CBC and BMP   . I have ordered test including CBC BMP ammonia  .   Disposition: Status is: Inpatient Remains inpatient appropriate because: Monitor for improvement in ammonia level  enoxaparin  (LOVENOX ) injection 40 mg Start: 12/21/23 1200   Family Communication: No one at bedside.  Discussed with son on 12/12. Level of care: Telemetry   Vitals:   12/22/23 1239 12/22/23 2036 12/23/23 0514 12/23/23 1443  BP: 119/77 (!) 143/76 131/77 122/70  Pulse: 78 75 72 81  Resp: 18 14 14 18   Temp: 98.9 F (37.2 C) 97.7 F (36.5 C) 98 F (36.7 C) 97.8 F (36.6 C)  TempSrc: Oral Oral Oral Oral  SpO2: 100% 100% 100% 100%  Weight:      Height:         Author: Yetta Blanch, MD 12/23/2023 5:18 PM  Please look on [email or web address] to find out who is on call.

## 2023-12-24 LAB — CBC WITH DIFFERENTIAL/PLATELET
Abs Immature Granulocytes: 0.02 K/uL (ref 0.00–0.07)
Basophils Absolute: 0.1 K/uL (ref 0.0–0.1)
Basophils Relative: 1 %
Eosinophils Absolute: 0.8 K/uL — ABNORMAL HIGH (ref 0.0–0.5)
Eosinophils Relative: 12 %
HCT: 32.7 % — ABNORMAL LOW (ref 36.0–46.0)
Hemoglobin: 10.9 g/dL — ABNORMAL LOW (ref 12.0–15.0)
Immature Granulocytes: 0 %
Lymphocytes Relative: 26 %
Lymphs Abs: 1.8 K/uL (ref 0.7–4.0)
MCH: 32.1 pg (ref 26.0–34.0)
MCHC: 33.3 g/dL (ref 30.0–36.0)
MCV: 96.2 fL (ref 80.0–100.0)
Monocytes Absolute: 0.6 K/uL (ref 0.1–1.0)
Monocytes Relative: 9 %
Neutro Abs: 3.7 K/uL (ref 1.7–7.7)
Neutrophils Relative %: 52 %
Platelets: 120 K/uL — ABNORMAL LOW (ref 150–400)
RBC: 3.4 MIL/uL — ABNORMAL LOW (ref 3.87–5.11)
RDW: 15.4 % (ref 11.5–15.5)
WBC: 7 K/uL (ref 4.0–10.5)
nRBC: 0 % (ref 0.0–0.2)

## 2023-12-24 LAB — COMPREHENSIVE METABOLIC PANEL WITH GFR
ALT: 23 U/L (ref 0–44)
AST: 32 U/L (ref 15–41)
Albumin: 2.7 g/dL — ABNORMAL LOW (ref 3.5–5.0)
Alkaline Phosphatase: 120 U/L (ref 38–126)
Anion gap: 7 (ref 5–15)
BUN: 21 mg/dL (ref 8–23)
CO2: 17 mmol/L — ABNORMAL LOW (ref 22–32)
Calcium: 8.1 mg/dL — ABNORMAL LOW (ref 8.9–10.3)
Chloride: 113 mmol/L — ABNORMAL HIGH (ref 98–111)
Creatinine, Ser: 1.09 mg/dL — ABNORMAL HIGH (ref 0.44–1.00)
GFR, Estimated: 56 mL/min — ABNORMAL LOW (ref >60)
Glucose, Bld: 121 mg/dL — ABNORMAL HIGH (ref 70–99)
Potassium: 3.8 mmol/L (ref 3.5–5.1)
Sodium: 137 mmol/L (ref 135–145)
Total Bilirubin: 1.1 mg/dL (ref 0.0–1.2)
Total Protein: 4.9 g/dL — ABNORMAL LOW (ref 6.5–8.1)

## 2023-12-24 LAB — MAGNESIUM: Magnesium: 1.6 mg/dL — ABNORMAL LOW (ref 1.7–2.4)

## 2023-12-24 LAB — AMMONIA: Ammonia: 166 umol/L — ABNORMAL HIGH (ref 9–35)

## 2023-12-24 NOTE — Progress Notes (Signed)
 Mobility Specialist - Progress Note   12/24/23 1020  Mobility  Activity Ambulated with assistance  Level of Assistance Moderate assist, patient does 50-74%  Assistive Device Front wheel walker  Distance Ambulated (ft) 80 ft  Range of Motion/Exercises Active  Activity Response Tolerated fair  Mobility visit 1 Mobility  Mobility Specialist Start Time (ACUTE ONLY) 1020  Mobility Specialist Stop Time (ACUTE ONLY) 1050  Mobility Specialist Time Calculation (min) (ACUTE ONLY) 30 min   Pt was found in bed and agreeable to mobilize. Assisted to [facility] prior to hallway ambulation. Grew fatigued and c/o back pain towards EOS. At EOS returned to recliner chair with all needs met. Call bell in reach and chair alarm on. RN in room.   [name],  Mobility Specialist Can be reached via Secure Chat

## 2023-12-24 NOTE — Progress Notes (Signed)
 [facility] Progress Note Patient: [name] FMW:[id number] DOB: December 03, 1955  DOA: 12/20/2023 DOS: the patient was seen and examined on 12/24/2023  Brief Hospital Course: 68 y.o. female with medical history significant of [name], recurrent hepatic encephalopathy, short gut syndrome, chronic diarrhea, CKD 3A, anemia of chronic disease, chronic anxiety with depression, hypothyroidism, recurrent hypotensive episode, hyperlipidemia, GERD, history of kidney stones, history of pulmonary emboli who presents to the ER today complaining of confusion and is being treated for possible hepatic encephalopathy in the setting of NASH cirrhosis.    Assessment and Plan: Acute metabolic encephalopathy secondary to hepatic encephalopathy Ammonia level elevated on admission.  More than 180. Currently no asterixis. Monitor ammonia level.  Still remains elevated. Continue lactulose . Continue rifaximin . Likely related to E. coli UTI.  E. coli UTI. Currently on IV ceftriaxone .  Switching to oral cefadroxil , sensitive to all other than Bactrim . Follow-up on blood cultures.  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   Continue current opioid regimen.   Iron  deficiency anemia H&H relatively stable. Continue PPI.   Chronic hypotension Continue midodrine    Hypothyroidism Continue Synthroid    Chronic vaginal bleeding due to fibroids. Continue Megace  40 mg twice daily, Patient follow-up with GYN.   Headache. Okay to take Tylenol .   Obesity Class 2 Body mass index is 38.31 kg/m.  Placing the pt at higher risk of poor outcomes.   Hypokalemia. Treated.   Mild AKI. Baseline creatinine 0.9.  Creatinine on admission 1.2.  Improving gradually monitor renal function with daily labs   Chronic thrombocytopenia  Secondary to cirrhosis.  Monitor.     Right foot pain with bruising. X-ray negative for any fracture. Continue pain control.  No evidence of gout.   Subjective: No nausea no vomiting no fever no chills.  Pain unchanged, has no new complaints today.  Physical Exam: Basal crackles. S1-S2 present. No edema.  Data Reviewed: I have Reviewed nursing notes, Vitals, and Lab results. Since last encounter, pertinent lab results CBC and BMP   . I have ordered test including CBC BMP ammonia  .   Disposition: Status is: Inpatient Remains inpatient appropriate because: Monitor for improvement in mental status.  enoxaparin  (LOVENOX ) injection 40 mg Start: 12/21/23 1200   Family Communication: No one at bedside.  Prior hospitalist discussed with son on 12/12. Level of care: Telemetry   Vitals:   12/23/23 0514 12/23/23 1443 12/23/23 2034 12/24/23 0515  BP: 131/77 122/70 134/87 119/60  Pulse: 72 81 82 73  Resp: 14 18 14 14   Temp: 98 F (36.7 C) 97.8 F (36.6 C) 98.6 F (37 C) 97.7 F (36.5 C)  TempSrc: Oral Oral Oral Oral  SpO2: 100% 100% 100% 98%  Weight:      Height:         Author: Kelsei Defino CHRISTELLA Gail, MD 12/24/2023 10:12 AM  Please look on [email or web address] to find out who is on call.

## 2023-12-24 NOTE — Plan of Care (Signed)

## 2023-12-25 LAB — CBC WITH DIFFERENTIAL/PLATELET
Abs Immature Granulocytes: 0.05 K/uL (ref 0.00–0.07)
Basophils Absolute: 0.1 K/uL (ref 0.0–0.1)
Basophils Relative: 1 %
Eosinophils Absolute: 0.7 K/uL — ABNORMAL HIGH (ref 0.0–0.5)
Eosinophils Relative: 10 %
HCT: 36 % (ref 36.0–46.0)
Hemoglobin: 11.6 g/dL — ABNORMAL LOW (ref 12.0–15.0)
Immature Granulocytes: 1 %
Lymphocytes Relative: 30 %
Lymphs Abs: 2.1 K/uL (ref 0.7–4.0)
MCH: 30.8 pg (ref 26.0–34.0)
MCHC: 32.2 g/dL (ref 30.0–36.0)
MCV: 95.5 fL (ref 80.0–100.0)
Monocytes Absolute: 0.7 K/uL (ref 0.1–1.0)
Monocytes Relative: 10 %
Neutro Abs: 3.3 K/uL (ref 1.7–7.7)
Neutrophils Relative %: 48 %
Platelets: 141 K/uL — ABNORMAL LOW (ref 150–400)
RBC: 3.77 MIL/uL — ABNORMAL LOW (ref 3.87–5.11)
RDW: 15.4 % (ref 11.5–15.5)
WBC: 6.9 K/uL (ref 4.0–10.5)
nRBC: 0 % (ref 0.0–0.2)

## 2023-12-25 LAB — COMPREHENSIVE METABOLIC PANEL WITH GFR
ALT: 21 U/L (ref 0–44)
AST: 30 U/L (ref 15–41)
Albumin: 2.7 g/dL — ABNORMAL LOW (ref 3.5–5.0)
Alkaline Phosphatase: 122 U/L (ref 38–126)
Anion gap: 8 (ref 5–15)
BUN: 19 mg/dL (ref 8–23)
CO2: 18 mmol/L — ABNORMAL LOW (ref 22–32)
Calcium: 8.5 mg/dL — ABNORMAL LOW (ref 8.9–10.3)
Chloride: 114 mmol/L — ABNORMAL HIGH (ref 98–111)
Creatinine, Ser: 0.98 mg/dL (ref 0.44–1.00)
GFR, Estimated: >60 mL/min (ref >60)
Glucose, Bld: 90 mg/dL (ref 70–99)
Potassium: 4 mmol/L (ref 3.5–5.1)
Sodium: 139 mmol/L (ref 135–145)
Total Bilirubin: 1.3 mg/dL — ABNORMAL HIGH (ref 0.0–1.2)
Total Protein: 5.3 g/dL — ABNORMAL LOW (ref 6.5–8.1)

## 2023-12-25 LAB — MAGNESIUM: Magnesium: 1.6 mg/dL — ABNORMAL LOW (ref 1.7–2.4)

## 2023-12-25 LAB — LACTIC ACID, PLASMA: Lactic Acid, Venous: 2.6 mmol/L (ref 0.5–1.9)

## 2023-12-25 LAB — AMMONIA: Ammonia: 94 umol/L — ABNORMAL HIGH (ref 9–35)

## 2023-12-25 NOTE — Progress Notes (Signed)
 Occupational Therapy Treatment Patient Details Name: [name] MRN: [id number] DOB: November 11, 1955 Today's Date: 12/25/2023   History of present illness Patient is a 68 y.o. female who presented from home 12/20/23 due to confusion, tremors and lethargy. She was admitted due to hepatic encephalopathy.  Of note, was recently for similar episode. CT head without any acute findings.  PMH:  decompensated cirrhosis, CKD IIIa, thrombocytopenia, hypothyroidism, CAD, RLS, chronic back and abdominal pain, short gut syndrome, iron  deficiency anemia, cirrhosis, and  blind in L eye.   OT comments  Patient seen in order to progress with functional mobility and activity tolerance. Patient asleep upon arrival, alerting and willing to participate. Patient with minimal back pain, but completing bed mobility at [facility], grooming ADLs at set up level and requiring up to min A for functional mobility. Patient min A for management of RW as patient required increased cues and assist to navigate environment. Patient returning to supine at end of session. OT recommendation remains appropriate, will continue to follow acutely.   CCMD contacted before and after session, HR up to 115 with mobility  O2 stable on RA      If plan is discharge home, recommend the following:      Equipment Recommendations  None recommended by OT    Recommendations for Other Services      Precautions / Restrictions Precautions Precautions: Fall Precaution/Restrictions Comments: L eye blind Restrictions Weight Bearing Restrictions Per Provider Order: No       Mobility Bed Mobility Overal bed mobility: Needs Assistance Bed Mobility: Supine to Sit, Sit to Supine     Supine to sit: Contact guard Sit to supine: Contact guard assist   General bed mobility comments: CGA for safety, increased time required    Transfers Overall transfer level: Needs assistance Equipment used: Rolling walker (2 wheels) Transfers: Sit to/from  Stand Sit to Stand: Min assist, Contact guard assist           General transfer comment: up to min A to navigate RW in hallway     Balance Overall balance assessment: Needs assistance Sitting-balance support: Bilateral upper extremity supported, Feet supported Sitting balance-Leahy Scale: Fair Sitting balance - Comments: did not challenge   Standing balance support: Bilateral upper extremity supported, Reliant on assistive device for balance Standing balance-Leahy Scale: Poor                             ADL either performed or assessed with clinical judgement   ADL Overall ADL's : Needs assistance/impaired     Grooming: Wash/dry face;Wash/dry hands;Set up;Sitting           Upper Body Dressing : Set up;Sitting Upper Body Dressing Details (indicate cue type and reason): don/doff gown     Toilet Transfer: Contact guard assist;Minimal assistance;Rolling walker (2 wheels) Toilet Transfer Details (indicate cue type and reason): min A for RW management         Functional mobility during ADLs: Minimal assistance;Rolling walker (2 wheels);Cueing for sequencing;Cueing for safety General ADL Comments: Patient seen in order to progress with functional mobility and activity tolerance. Patient asleep upon arrival, alerting and willing to participate. Patient with minimal back pain, but completing bed mobility at [facility], grooming ADLs at set up level and requiring up to min A for functional mobility. Patient min A for management of RW as patient required increased cues and assist to navigate environment. Patient returning to supine at end of session. OT recommendation  remains appropriate, will continue to follow acutely.    Extremity/Trunk Assessment              Vision Baseline Vision/History: 2 Legally blind (L eye) Ability to See in Adequate Light: 2 Moderately impaired     Perception     Praxis     Communication Communication Communication: No apparent  difficulties   Cognition Arousal: Alert Behavior During Therapy: WFL for tasks assessed/performed Cognition: History of cognitive impairments, Cognition impaired   Orientation impairments: Place, Time Awareness: Intellectual awareness intact, Online awareness impaired Memory impairment (select all impairments): Short-term memory Attention impairment (select first level of impairment): Sustained attention Executive functioning impairment (select all impairments): Organization, Problem solving OT - Cognition Comments: Son present, cognition is improving, not quite at baseline                 Following commands: Intact (slowly)        Cueing   Cueing Techniques: Verbal cues  Exercises      Shoulder Instructions       General Comments VSS on RA`    Pertinent Vitals/ Pain       Pain Assessment Pain Assessment: Faces Faces Pain Scale: Hurts a little bit Pain Location: back Pain Descriptors / Indicators: Discomfort, Aching  Home Living Family/patient expects to be discharged to:: Private residence                                 Additional Comments: Pt lives with son [name] - very supportive, provides assist as needed; [name] present bedside for evaluation      Prior Functioning/Environment              Frequency           Progress Toward Goals  OT Goals(current goals can now be found in the care plan section)  Progress towards OT goals: Progressing toward goals  Acute Rehab OT Goals Patient Stated Goal: to get better OT Goal Formulation: With patient Time For Goal Achievement: 01/04/24 Potential to Achieve Goals: Good  Plan      Co-evaluation                 AM-PAC OT 6 Clicks Daily Activity     Outcome Measure   Help from another person eating meals?: A Little Help from another person taking care of personal grooming?: A Little Help from another person toileting, which includes using toliet, bedpan, or urinal?: A  Little Help from another person bathing (including washing, rinsing, drying)?: A Lot Help from another person to put on and taking off regular upper body clothing?: A Little Help from another person to put on and taking off regular lower body clothing?: A Lot 6 Click Score: 16    End of Session Equipment Utilized During Treatment: Gait belt;Rolling walker (2 wheels)  OT Visit Diagnosis: Muscle weakness (generalized) (M62.81);Unsteadiness on feet (R26.81)   Activity Tolerance Patient tolerated treatment well   Patient Left in bed;with call bell/phone within reach;with bed alarm set   Nurse Communication Mobility status        Time: 8948-8890 OT Time Calculation (min): 18 min  Charges: OT General Charges $OT Visit: 1 Visit OT Treatments $Self Care/Home Management : 8-22 mins  Ronal Gift E. Laporche Martelle, OTR/L Acute Rehabilitation Services [phone]   Ronal Gift Salt 12/25/2023, 1:25 PM

## 2023-12-25 NOTE — Plan of Care (Signed)
°  Problem: Education: Goal: Knowledge of General Education information will improve Description: Including pain rating scale, medication(s)/side effects and non-pharmacologic comfort measures Outcome: Progressing   Problem: Clinical Measurements: Goal: Will remain free from infection Outcome: Progressing Goal: Diagnostic test results will improve Outcome: Progressing   Problem: Activity: Goal: Risk for activity intolerance will decrease Outcome: Progressing   Problem: Skin Integrity: Goal: Risk for impaired skin integrity will decrease Outcome: Progressing

## 2023-12-25 NOTE — Progress Notes (Signed)
 [facility] Progress Note Patient: [name] FMW:[id number] DOB: January 12, 1955  DOA: 12/20/2023 DOS: the patient was seen and examined on 12/25/2023  Brief Hospital Course: 68 y.o. female with medical history significant of [name], recurrent hepatic encephalopathy, short gut syndrome, chronic diarrhea, CKD 3A, anemia of chronic disease, chronic anxiety with depression, hypothyroidism, recurrent hypotensive episode, hyperlipidemia, GERD, history of kidney stones, history of pulmonary emboli who presents to the ER today complaining of confusion and is being treated for possible hepatic encephalopathy in the setting of NASH cirrhosis.    Assessment and Plan: Acute metabolic encephalopathy secondary to hepatic encephalopathy Ammonia level elevated on admission.  More than 180, now improving. Currently no asterixis. Monitor ammonia level.  Still remains elevated but much improved. Continue lactulose . Continue rifaximin . Likely related to E. coli UTI.  E. coli UTI. Oral cefadroxil , sensitive to all other than Bactrim . Follow-up on blood cultures.  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   Continue current opioid regimen, discussed minimizing to avoid constipation.   Iron  deficiency anemia H&H relatively stable. Continue PPI.   Chronic hypotension Continue midodrine    Hypothyroidism Continue Synthroid    Chronic vaginal bleeding due to fibroids. Continue Megace  40 mg twice daily, Patient follow-up with GYN.   Headache. Okay to take Tylenol .   Obesity Class 2 Body mass index is 38.31 kg/m.  Placing the pt at higher risk of poor outcomes.   Hypokalemia. Treated.   Mild AKI. Baseline creatinine 0.9.  Creatinine on admission 1.2.  Improving gradually and now back to baseline. Will plan to monitor renal function with daily  labs.   Chronic thrombocytopenia  Secondary to cirrhosis.  Monitor.    Right foot pain with bruising. X-ray negative for any fracture. Continue pain control.  No evidence of gout.   Subjective:  No complaints, much more awake today and son at bedside.  Physical Exam: AAOx3 today Basal crackles. S1-S2 present. No edema.  Data Reviewed: I have Reviewed nursing notes, Vitals, and Lab results. Since last encounter, pertinent lab results CBC and BMP   . I have ordered test including CBC BMP ammonia  .   Disposition: Status is: Inpatient Remains inpatient appropriate because: Monitor for improvement in mental status.  enoxaparin  (LOVENOX ) injection 40 mg Start: 12/21/23 1200   Family Communication: Son at bedside and updated this morning.  Prior hospitalist discussed with son on 12/12.  Level of care: Telemetry   Vitals:   12/24/23 1050 12/24/23 1338 12/24/23 2132 12/25/23 0429  BP: 124/70 127/71 (!) 105/57 125/63  Pulse: 89 85 81 80  Resp:   14 14  Temp:  97.9 F (36.6 C) 98.8 F (37.1 C) 99 F (37.2 C)  TempSrc:  Oral Oral Oral  SpO2:   100% 100%  Weight:      Height:         Author: Keilee Denman CHRISTELLA Gail, MD 12/25/2023 9:33 AM  Please look on [email or web address] to find out who is on call.

## 2023-12-25 NOTE — Progress Notes (Addendum)
 PHYSICAL THERAPY  Pt was OOB on BSC with NT upon entering room.  Pt request I return tomorrow morning.  Pt did work with OT earlier.  Pt is familiar from prior admits.  Lives home with a very attentive Son.   Katheryn Leap  PTA Acute  Rehabilitation Services Office M-F          [phone]

## 2023-12-26 ENCOUNTER — Other Ambulatory Visit: Payer: Medicare (Managed Care) | Admitting: Licensed Clinical Social Worker

## 2023-12-26 LAB — CULTURE, BLOOD (ROUTINE X 2)
Culture: NO GROWTH
Culture: NO GROWTH

## 2023-12-26 LAB — CBC WITH DIFFERENTIAL/PLATELET
Abs Immature Granulocytes: 0.02 K/uL (ref 0.00–0.07)
Basophils Absolute: 0.1 K/uL (ref 0.0–0.1)
Basophils Relative: 1 %
Eosinophils Absolute: 0.6 K/uL — ABNORMAL HIGH (ref 0.0–0.5)
Eosinophils Relative: 9 %
HCT: 33.8 % — ABNORMAL LOW (ref 36.0–46.0)
Hemoglobin: 10.6 g/dL — ABNORMAL LOW (ref 12.0–15.0)
Immature Granulocytes: 0 %
Lymphocytes Relative: 30 %
Lymphs Abs: 2 K/uL (ref 0.7–4.0)
MCH: 30.7 pg (ref 26.0–34.0)
MCHC: 31.4 g/dL (ref 30.0–36.0)
MCV: 98 fL (ref 80.0–100.0)
Monocytes Absolute: 0.7 K/uL (ref 0.1–1.0)
Monocytes Relative: 10 %
Neutro Abs: 3.4 K/uL (ref 1.7–7.7)
Neutrophils Relative %: 50 %
Platelets: 125 K/uL — ABNORMAL LOW (ref 150–400)
RBC: 3.45 MIL/uL — ABNORMAL LOW (ref 3.87–5.11)
RDW: 15.4 % (ref 11.5–15.5)
WBC: 6.8 K/uL (ref 4.0–10.5)
nRBC: 0 % (ref 0.0–0.2)

## 2023-12-26 LAB — COMPREHENSIVE METABOLIC PANEL WITH GFR
ALT: 19 U/L (ref 0–44)
AST: 27 U/L (ref 15–41)
Albumin: 2.7 g/dL — ABNORMAL LOW (ref 3.5–5.0)
Alkaline Phosphatase: 113 U/L (ref 38–126)
Anion gap: 8 (ref 5–15)
BUN: 18 mg/dL (ref 8–23)
CO2: 18 mmol/L — ABNORMAL LOW (ref 22–32)
Calcium: 8.2 mg/dL — ABNORMAL LOW (ref 8.9–10.3)
Chloride: 115 mmol/L — ABNORMAL HIGH (ref 98–111)
Creatinine, Ser: 1.22 mg/dL — ABNORMAL HIGH (ref 0.44–1.00)
GFR, Estimated: 49 mL/min — ABNORMAL LOW (ref >60)
Glucose, Bld: 117 mg/dL — ABNORMAL HIGH (ref 70–99)
Potassium: 3.9 mmol/L (ref 3.5–5.1)
Sodium: 141 mmol/L (ref 135–145)
Total Bilirubin: 1.2 mg/dL (ref 0.0–1.2)
Total Protein: 4.8 g/dL — ABNORMAL LOW (ref 6.5–8.1)

## 2023-12-26 LAB — MAGNESIUM: Magnesium: 1.6 mg/dL — ABNORMAL LOW (ref 1.7–2.4)

## 2023-12-26 LAB — AMMONIA: Ammonia: 147 umol/L — ABNORMAL HIGH (ref 9–35)

## 2023-12-26 MED ORDER — ORAL CARE MOUTH RINSE: 15.0000 mL | OROMUCOSAL | Status: DC | PRN

## 2023-12-26 NOTE — Plan of Care (Signed)
°  Problem: Education: Goal: Knowledge of General Education information will improve Description: Including pain rating scale, medication(s)/side effects and non-pharmacologic comfort measures Outcome: Progressing   Problem: Health Behavior/Discharge Planning: Goal: Ability to manage health-related needs will improve Outcome: Progressing   Problem: Clinical Measurements: Goal: Ability to maintain clinical measurements within normal limits will improve Outcome: Progressing Goal: Will remain free from infection Outcome: Progressing Goal: Diagnostic test results will improve Outcome: Progressing Goal: Respiratory complications will improve Outcome: Progressing Goal: Cardiovascular complication will be avoided Outcome: Progressing   Problem: Nutrition: Goal: Adequate nutrition will be maintained Outcome: Progressing   Problem: Pain Managment: Goal: General experience of comfort will improve and/or be controlled Outcome: Progressing   Problem: Skin Integrity: Goal: Risk for impaired skin integrity will decrease Outcome: Progressing   Problem: Safety: Goal: Ability to remain free from injury will improve Outcome: Progressing

## 2023-12-26 NOTE — Patient Outreach (Signed)
 Social Drivers of Health  Community Resource and Care Coordination Visit Note   12/26/2023  Name: [name] MRN: [id number] DOB:Jan 26, 1955  Situation: Referral received for [facility] needs assessment and assistance related to Application for Medicaid. I obtained verbal consent from Caregiver Patient.  Visit completed with Son thena [name] Chalk on the phone.   Background:      Assessment:   Goals Addressed             This Visit's Progress    COMPLETED: BSW VBCI Social Work Care Plan       Problems:   Assistance with getting full medicaid benefits  CSW Clinical Goal(s):   Over the next 2 weeks the Caregiver Patient will will follow up with Medicaid walk in clinic as directed by Social Work.  Interventions:  SW will make referral to the walk in Medicaid clinic Patient and son will go to the walk in clinic  Patient Goals/Self-Care Activities:  Work with the Medicaid walk in clinic  Plan:   Telephone follow up appointment with care management team member scheduled for:  12/26/2023 at 11:00 am        Recommendation:   attend all scheduled provider appointments Continue to work with the walk in [facility] to assist with the application for Medicaid  Follow Up Plan:   Patient has achieved all patient stated goals. [employer] will be closed. Patient has been provided contact information should new needs arise.   Tobias [name], PhD [facility], [facility] Social Worker Direct Dial: [phone]  Fax: [phone]

## 2023-12-26 NOTE — Progress Notes (Signed)
 Physical Therapy Treatment Patient Details Name: [name] MRN: [id number] DOB: 10/02/1955 Today's Date: 12/26/2023   History of Present Illness Patient is a 68 y.o. female who presented from home 12/20/23 due to confusion, tremors and lethargy. She was admitted due to hepatic encephalopathy.  Of note, was recently for similar episode. CT head without any acute findings.  PMH:  decompensated cirrhosis, CKD IIIa, thrombocytopenia, hypothyroidism, CAD, RLS, chronic back and abdominal pain, short gut syndrome, iron  deficiency anemia, cirrhosis, and  blind in L eye.    PT Comments  PT - Cognition Comments: Pt familiar from prior admits.  Pleasant.  AxO x 3.  Has a great Son who not only takes care of his Mom but also travels with her.  Going to the beach later this year.  Recent trip to [place].  Assisted OOB to [facility] to void then amb in hallway went well.  General Gait Details: Pt tolerated amb a functional distance with her walker at Supervision level requiring no physical assist. Assisted back to bed per Pt request to rest.    Pt plans to D/C to home with Son support when medically cleared.     If plan is discharge home, recommend the following: A little help with walking and/or transfers;A little help with bathing/dressing/bathroom;Assistance with cooking/housework;Assist for transportation;Help with stairs or ramp for entrance   Can travel by private vehicle        Equipment Recommendations  None recommended by PT    Recommendations for Other Services       Precautions / Restrictions Precautions Precautions: Fall Precaution/Restrictions Comments: L eye blind Restrictions Weight Bearing Restrictions Per Provider Order: No     Mobility  Bed Mobility Overal bed mobility: Needs Assistance Bed Mobility: Supine to Sit, Sit to Supine     Supine to sit: Supervision Sit to supine: Supervision   General bed mobility comments: self able with increased time and use of rail.     Transfers Overall transfer level: Needs assistance Equipment used: Rolling walker (2 wheels), None Transfers: Sit to/from Stand Sit to Stand: Modified independent (Device/Increase time), Supervision           General transfer comment: Pt self able to get from bed to [facility] as well as from [facility] to her walker at Supervision level/NO physical assist.    Ambulation/Gait Ambulation/Gait assistance: Supervision Gait Distance (Feet): 46 Feet Assistive device: Rolling walker (2 wheels) Gait Pattern/deviations: Step-to pattern, Decreased step length - right, Decreased step length - left, Wide base of support       General Gait Details: Pt tolerated amb a functional distance with her walker at Supervision level requiring no physical assist.   Stairs             Wheelchair Mobility     Tilt Bed    Modified Rankin (Stroke Patients Only)       Balance                                            Communication    Cognition Arousal: Alert Behavior During Therapy: WFL for tasks assessed/performed   PT - Cognitive impairments: No apparent impairments                       PT - Cognition Comments: Pt familiar from prior admits.  Pleasant.  AxO x 3.  Has a  great Son who not only takes care of his Mom but also travels with her.  Going to the beach later this year.  Recent trip to [place]. Following commands: Intact      Cueing Cueing Techniques: Verbal cues  Exercises      General Comments        Pertinent Vitals/Pain Pain Assessment Pain Assessment: Faces Faces Pain Scale: Hurts a little bit Pain Location: back (chronic) Pain Descriptors / Indicators: Discomfort, Aching Pain Intervention(s): Monitored during session    Home Living                          Prior Function            PT Goals (current goals can now be found in the care plan section) Progress towards PT goals: Progressing toward goals     Frequency    Min 2X/week      PT Plan      Co-evaluation              AM-PAC PT 6 Clicks Mobility   Outcome Measure  Help needed turning from your back to your side while in a flat bed without using bedrails?: None Help needed moving from lying on your back to sitting on the side of a flat bed without using bedrails?: None Help needed moving to and from a bed to a chair (including a wheelchair)?: None Help needed standing up from a chair using your arms (e.g., wheelchair or bedside chair)?: None Help needed to walk in hospital room?: None Help needed climbing 3-5 steps with a railing? : A Little 6 Click Score: 23    End of Session Equipment Utilized During Treatment: Gait belt Activity Tolerance: Patient tolerated treatment well Patient left: in bed;with call bell/phone within reach;with family/visitor present;with bed alarm set Nurse Communication: Mobility status PT Visit Diagnosis: Unsteadiness on feet (R26.81);Muscle weakness (generalized) (M62.81);Other abnormalities of gait and mobility (R26.89)     Time: 9071-9047 PT Time Calculation (min) (ACUTE ONLY): 24 min  Charges:    $Gait Training: 8-22 mins $Therapeutic Activity: 8-22 mins PT General Charges $$ ACUTE PT VISIT: 1 Visit                     Katheryn Leap  PTA Acute  Rehabilitation Services Office M-F          [phone]

## 2023-12-26 NOTE — Progress Notes (Signed)
 [facility] Progress Note Patient: [name] FMW:[id number] DOB: 1955-03-16  DOA: 12/20/2023 DOS: the patient was seen and examined on 12/26/2023  Brief Hospital Course: 68 y.o. female with medical history significant of [name], recurrent hepatic encephalopathy, short gut syndrome, chronic diarrhea, CKD 3A, anemia of chronic disease, chronic anxiety with depression, hypothyroidism, recurrent hypotensive episode, hyperlipidemia, GERD, history of kidney stones, history of pulmonary emboli who presents to the ER today complaining of confusion and is being treated for possible hepatic encephalopathy in the setting of NASH cirrhosis.    Assessment and Plan: Acute metabolic encephalopathy secondary to hepatic encephalopathy Ammonia level elevated on admission.  Overall improved, but going up again today. Currently no asterixis. Monitor ammonia level.  Fluctuating ammonia levels, along with her mental status, because she refused the last 2 doses of lactulose  as of this morning. Continue lactulose . Continue rifaximin . Likely related to E. coli UTI. Discussed with patient and son at the bedside, encouraged her to not refuse lactulose  doses.  Son agrees that he is not comfortable taking her home today, as she is headed in the wrong direction a little bit with ammonia level rising, and increased somnolence this morning.  E. coli UTI. Oral cefadroxil , sensitive to all other than Bactrim . Follow-up on blood cultures.  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   Continue current opioid regimen, discussed minimizing to avoid constipation.   Iron  deficiency anemia H&H relatively stable. Continue PPI.   Chronic hypotension Continue midodrine    Hypothyroidism Continue Synthroid    Chronic vaginal bleeding due to fibroids. Continue Megace  40 mg  twice daily, Patient follow-up with GYN.   Headache. Okay to take Tylenol .   Obesity Class 2 Body mass index is 38.31 kg/m.  Placing the pt at higher risk of poor outcomes.   Hypokalemia. Treated.   Mild AKI. Baseline creatinine 0.9.  Creatinine on admission 1.2.  Overall improved gradually, but a little worse again today.  Will continue to monitor, might need to consider holding Lasix  going forward if creatinine does not improve.   Chronic thrombocytopenia  Secondary to cirrhosis.  Monitor.    Right foot pain with bruising. X-ray negative for any fracture. Continue pain control.  No evidence of gout.   Subjective:  No complaints, but she seems slightly confused this morning.  She agrees that she feels a little lethargic.  Per EMR review and discussion with the bedside RN, she refused her doses of lactulose  last evening and this morning.  Physical Exam: AAOx3 today Basal crackles. S1-S2 present. No edema.  Data Reviewed: I have Reviewed nursing notes, Vitals, and Lab results. Since last encounter, pertinent lab results CBC and BMP   . I have ordered test including CBC BMP ammonia  .   Disposition: Status is: Inpatient Remains inpatient appropriate because: Monitor for improvement in mental status.  enoxaparin  (LOVENOX ) injection 40 mg Start: 12/21/23 1200   Family Communication: Son at bedside and updated this morning.   Level of care: Telemetry   Vitals:   12/24/23 2132 12/25/23 0429 12/25/23 1316 12/25/23 2121  BP: (!) 105/57 125/63 131/85 131/67  Pulse: 81 80 (!) 103 86  Resp: 14 14  16   Temp: 98.8 F (37.1 C) 99 F (37.2 C) 98.3 F (36.8 C) 98.2 F (36.8 C)  TempSrc: Oral Oral Oral Oral  SpO2: 100% 100% 100% 97%  Weight:  Height:         Author: Anilah Huck CHRISTELLA Gail, MD 12/26/2023 1:06 PM  Please look on [email or web address] to find out who is on call.

## 2023-12-26 NOTE — Plan of Care (Signed)
   Problem: Education: Goal: Knowledge of General Education information will improve Description: Including pain rating scale, medication(s)/side effects and non-pharmacologic comfort measures Outcome: Progressing   Problem: Health Behavior/Discharge Planning: Goal: Ability to manage health-related needs will improve Outcome: Progressing   Problem: Clinical Measurements: Goal: Ability to maintain clinical measurements within normal limits will improve Outcome: Progressing   Problem: Activity: Goal: Risk for activity intolerance will decrease Outcome: Progressing   Problem: Nutrition: Goal: Adequate nutrition will be maintained Outcome: Progressing   Problem: Coping: Goal: Level of anxiety will decrease Outcome: Progressing   Problem: Pain Managment: Goal: General experience of comfort will improve and/or be controlled Outcome: Progressing

## 2023-12-26 NOTE — Patient Instructions (Signed)

## 2023-12-27 ENCOUNTER — Inpatient Hospital Stay: Payer: Medicare (Managed Care) | Admitting: Family Medicine

## 2023-12-27 DIAGNOSIS — K7682 Hepatic encephalopathy: Secondary | ICD-10-CM | POA: Diagnosis not present

## 2023-12-27 DIAGNOSIS — G8929 Other chronic pain: Secondary | ICD-10-CM | POA: Diagnosis present

## 2023-12-27 DIAGNOSIS — B9620 Unspecified Escherichia coli [E. coli] as the cause of diseases classified elsewhere: Secondary | ICD-10-CM | POA: Diagnosis present

## 2023-12-27 LAB — AMMONIA: Ammonia: 117 umol/L — ABNORMAL HIGH (ref 9–35)

## 2023-12-27 MED ORDER — SODIUM BICARBONATE 650 MG PO TABS
650.0000 mg | ORAL_TABLET | Freq: Two times a day (BID) | ORAL | Status: DC
  Administered 2023-12-27 – 2023-12-28 (×2): 650 mg via ORAL
  Filled 2023-12-27 (×2): qty 1

## 2023-12-27 NOTE — Assessment & Plan Note (Addendum)
 Continue Synthroid 

## 2023-12-27 NOTE — Assessment & Plan Note (Deleted)
 Baseline creatinine 0.8, presenting creatinine 1.15 Returned to baseline but had recurrence on 10/8 Responded to albumin  x 1 and again recovered to baseline Avoid nephrotoxic agents, dehydration, and hypotension Resolved as of now Non-anion gap metabolic acidosis Serum bicarb <20 periodically Continue to treat underlying conditions Will add PO HCO3 TID x 3 days Will need PCP f/u later this week

## 2023-12-27 NOTE — Progress Notes (Signed)
 Progress Note   Patient: [name] FMW:[id number] DOB: 11/09/1955 DOA: 12/20/2023     7 DOS: the patient was seen and examined on 12/27/2023   Brief hospital course: 68yo with h/o NASH cirrhosis with encephalopathy, short gut syndrome with chronic diarrhea, stage 3a CKD with anemia, hypothyroidism, anxiety/depression, chronic hypotension, and HLD who presented on 12/10 with AMS. Elevated NH4, treated with lactulose  for hepatic encephalopathy.  Complicated by E coli UTI.  Mental status now improving.       Assessment & Plan Hepatic encephalopathy [facility]) Patient with known cryptogenic/NASH cirrhosis which has been decompensated with encephalopathy Presented with confusion and hyperammonemia up to 184 Possibly triggered by UTI Lactulose  is TID without missing doses Resume rifaximin  and spironolactone  Goal 5-7 loose stools per day Improved but had recurrence on 10/8 NH4 remains elevated Improved to close to baseline; anticipate dc in AM E. 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 Serum bicarb <20 periodically Continue to treat underlying conditions Will add PO HCO3 BID Will need PCP f/u in 1-2 weeks with recheck of labs Hypothyroidism Continue Synthroid  Decompensated cirrhosis (HCC) NASH cirrhosis MELD/MELD-Na score is 13/14, with a mortality rate of 6% Continue propranolol, Rifaximin    Resume diuretics (furosemide , spironolactone ) Chronic hypotension Resume midodrine  with hold parameters (SBP >120) Hyperlipidemia Continue atorvastatin  Depression Continue venlafaxine  Chronic back pain Son reports that she is not currently needing opiates for pain control (patient would like to add back but is ok with holding for now) Vaginal bleeding Chronic, due to fibroids Continue Megace  40 mg twice daily Outpatient follow-up with GYN Class 2 obesity due to excess calories with body mass index (BMI) of 38.0 to 38.9 in adult Body mass index is 38.31 kg/m.SABRA  Weight loss should be encouraged Outpatient PCP/bariatric medicine f/u encouraged Significantly low or high BMI is associated with higher medical risk including morbidity and mortality  DNR (do not resuscitate)/DNI(Do Not intubate) DNR confirmed at the time of admission Patient will need a gold out of facility DNR form at the time of discharge       Consultants: PT OT Inpatient case management   Procedures: None   Antibiotics: None    30 Day Unplanned Readmission Risk Score    Flowsheet Row ED to Hosp-Admission (Current) from 12/20/2023 in [place] [facility]  30 Day Unplanned Readmission Risk Score (%) 62.23 Filed at 12/27/2023 1600    This score is the patient's risk of an unplanned readmission within 30 days of being discharged (0 -100%). The score is based on dignosis, age, lab data, medications, orders, and past utilization.   Low:  0-14.9   Medium: 15-21.9   High: 22-29.9   Extreme: 30 and above           Subjective: Feeling better.  Son is concerned that she needs 1 more day of monitoring prior to dc to help prevent need for repeat hospitalization.   Objective: Vitals:   12/27/23 0602 12/27/23 1356  BP: 128/70 108/64  Pulse: 79 93  Resp: 20 16  Temp: 98.5 F (36.9 C) 98.2 F (36.8 C)  SpO2: 99% 100%    Intake/Output Summary (Last 24 hours) at 12/27/2023 1843 Last data filed at  12/27/2023 1430 Gross per 24 hour  Intake 240 ml  Output --  Net 240 ml   Filed Weights   12/20/23 1138 12/20/23 1700  Weight: 95 kg 95 kg    Exam:  General:  Appears calm and comfortable and is in NAD Eyes:  normal lids, L eye chronically impaired ENT:  grossly normal hearing, lips & tongue, mmm Cardiovascular:  RRR. No LE edema.  Respiratory:   CTA bilaterally with no wheezes/rales/rhonchi.  Normal respiratory effort. Abdomen:  soft, NT, ND Skin:  no rash or induration seen on limited exam Musculoskeletal:  grossly normal tone BUE/BLE, good ROM, no bony abnormality Psychiatric:  grossly normal mood and affect, speech fluent and appropriate, AOx3 Neurologic:  CN 2-12 grossly intact, moves all extremities in coordinated fashion  Data Reviewed: I have reviewed the patient's lab results since admission.  Pertinent labs for today include:   NH4 117     Family Communication: Son was present  Mobility: PT/OT Consulted and are recommending - Home Health Pt12/11/2023 1058    Code Status: Do not attempt resuscitation (DNR) PRE-ARREST INTERVENTIONS DESIRED    Disposition: Status is: Inpatient Remains inpatient appropriate because: ongoing management     Time spent: 50 minutes  Unresulted Labs (From admission, onward)     Start     Ordered   12/28/23 0500  Basic metabolic panel with GFR  Tomorrow morning,   R        12/27/23 1843   12/28/23 0500  CBC  Tomorrow morning,   R        12/27/23 1843             Author: Delon Herald, MD 12/27/2023 6:43 PM  For on call review [email or web address].

## 2023-12-27 NOTE — Assessment & Plan Note (Addendum)
 Continue venlafaxine

## 2023-12-27 NOTE — Assessment & Plan Note (Deleted)
 NASH cirrhosis MELD/MELD-Na score is 13/14, with a mortality rate of 6% Continue propranolol, Rifaximin   Held diuretics (furosemide , spironolactone ) in the setting of recent AKI Restarted aldactone  at 12.5 mg daily Resume Lasix  at time of dc

## 2023-12-27 NOTE — Assessment & Plan Note (Deleted)
 DNR confirmed at the time of admission [facility] documents reviewed Patient will need a gold out of facility DNR form at the time of discharge

## 2023-12-27 NOTE — Assessment & Plan Note (Addendum)
 Son reports that she is not currently needing opiates for pain control (patient would like to add back but is ok with holding for now)

## 2023-12-27 NOTE — Assessment & Plan Note (Deleted)
 Body mass index is 38.31 kg/m.SABRA  Weight loss should be encouraged Outpatient PCP/bariatric medicine f/u encouraged Significantly low or high BMI is associated with higher medical risk including morbidity and mortality

## 2023-12-27 NOTE — Assessment & Plan Note (Deleted)
 Continue venlafaxine

## 2023-12-27 NOTE — Assessment & Plan Note (Addendum)
 Continue atorvastatin 

## 2023-12-27 NOTE — Plan of Care (Signed)

## 2023-12-27 NOTE — Assessment & Plan Note (Deleted)
 Patient with known cryptogenic/NASH cirrhosis which has been decompensated with encephalopathy Presented with confusion and hyperammonemia 117 Increased lactulose  to QID (previously BID -> TID) due to recurrent encephalopathy Resume rifaximin  and spironolactone  Goal 2-3 loose stools per day Improved but had recurrence on 10/8 NH4 remains elevated Given prolonged and refractory condition, consulted GI Finally improved back to better than baseline and ready for dc Ammonia level elevated on admission.  More than 180. Currently no asterixis. Monitor ammonia level.  Still remains elevated. Continue lactulose . Continue rifaximin . Likely related to E. coli UTI.

## 2023-12-27 NOTE — Assessment & Plan Note (Deleted)
 Acute encephalopathy on presentation likely related to HE as well as UTI Currently on IV ceftriaxone .  Switching to oral cefadroxil . Follow-up on blood cultures.  So far negative for 48 hours.

## 2023-12-27 NOTE — Assessment & Plan Note (Deleted)
-

## 2023-12-27 NOTE — Assessment & Plan Note (Deleted)
 Continue

## 2023-12-27 NOTE — Assessment & Plan Note (Addendum)
 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

## 2023-12-27 NOTE — Assessment & Plan Note (Addendum)
 Chronic, due to fibroids Continue Megace  40 mg twice daily Outpatient follow-up with GYN

## 2023-12-27 NOTE — Progress Notes (Signed)
 Occupational Therapy Treatment Patient Details Name: [name] MRN: [id number] DOB: 1955/11/06 Today's Date: 12/27/2023   History of present illness Patient is a 68 y.o. female who presented from home 12/20/23 due to confusion, tremors and lethargy. She was admitted due to hepatic encephalopathy.  Of note, was recently for similar episode. CT head without any acute findings.  PMH:  decompensated cirrhosis, CKD IIIa, thrombocytopenia, hypothyroidism, CAD, RLS, chronic back and abdominal pain, short gut syndrome, iron  deficiency anemia, cirrhosis, and  blind in L eye.   OT comments  Pt progressing toward goals. Son present and states his Mom's cognition is close to baseline. Son assisting as needed with self care in the room. Pt able to ambulate @ 175 ft with CGA @ RW level. Son able to provide any assistance needed at DC. Continue to recommend HHOT at DC. Acute OT to follow. VSS RA      If plan is discharge home, recommend the following:  A little help with walking and/or transfers;A little help with bathing/dressing/bathroom;Assist for transportation;Help with stairs or ramp for entrance;Supervision due to cognitive status   Equipment Recommendations  None recommended by OT    Recommendations for Other Services      Precautions / Restrictions Precautions Precautions: Fall Precaution/Restrictions Comments: L eye blind Restrictions Weight Bearing Restrictions Per Provider Order: No       Mobility Bed Mobility Overal bed mobility: Needs Assistance Bed Mobility: Supine to Sit, Sit to Supine     Supine to sit: Supervision Sit to supine: Supervision        Transfers Overall transfer level: Needs assistance Equipment used: Rolling walker (2 wheels), None Transfers: Sit to/from Stand Sit to Stand: Contact guard assist                 Balance Overall balance assessment: Needs assistance Sitting-balance support: Bilateral upper extremity supported, Feet  supported Sitting balance-Leahy Scale: Fair Sitting balance - Comments: did not challenge Postural control: Left lateral lean Standing balance support: Bilateral upper extremity supported, Reliant on assistive device for balance Standing balance-Leahy Scale: Poor                             ADL either performed or assessed with clinical judgement   ADL Overall ADL's : Needs assistance/impaired Eating/Feeding: Modified independent Eating/Feeding Details (indicate cue type and reason): needed cup placed into hand Grooming: Wash/dry face;Wash/dry hands;Set up;Sitting   Upper Body Bathing: Sitting;Set up;Supervision/ safety   Lower Body Bathing: Minimal assistance;Sit to/from stand   Upper Body Dressing : Set up;Sitting Upper Body Dressing Details (indicate cue type and reason): don/doff gown     Toilet Transfer: Contact guard assist;Minimal assistance;Rolling walker (2 wheels) Toilet Transfer Details (indicate cue type and reason): min A for RW management Toileting- Clothing Manipulation and Hygiene: Sit to/from stand;Supervision/safety       Functional mobility during ADLs: Rolling walker (2 wheels);Cueing for safety;Contact guard assist General ADL Comments: Patient seen in order to progress with functional mobility and activity tolerance. Patient asleep upon arrival, alerting and willing to participate. Patient with minimal back pain, but completing bed mobility at [facility], grooming ADLs at set up level and requiring up to min A for functional mobility. Patient min A for management of RW as patient required increased cues and assist to navigate environment. Patient returning to supine at end of session. OT recommendation remains appropriate, will continue to follow acutely.    Extremity/Trunk Assessment Upper Extremity Assessment  Upper Extremity Assessment: Generalized weakness            Vision       Perception Perception Perception:  (difficult to fully assess  is it cognition vs vision as well)   Praxis     Communication Communication Communication: No apparent difficulties   Cognition Arousal: Alert Behavior During Therapy: WFL for tasks assessed/performed Cognition: History of cognitive impairments, Cognition impaired   Orientation impairments: Place, Time Awareness: Intellectual awareness intact, Online awareness impaired Memory impairment (select all impairments): Short-term memory Attention impairment (select first level of impairment): Selective attention   OT - Cognition Comments: Son present and states cognition close to baseline                 Following commands: Intact        Cueing   Cueing Techniques: Verbal cues  Exercises      Shoulder Instructions       General Comments son assisting with pt care in the room    Pertinent Vitals/ Pain       Pain Assessment Pain Assessment: Faces Faces Pain Scale: Hurts a little bit Pain Location: L knee Pain Descriptors / Indicators: Discomfort, Aching Pain Intervention(s): Limited activity within patient's tolerance  Home Living                                          Prior Functioning/Environment              Frequency  Min 2X/week        Progress Toward Goals  OT Goals(current goals can now be found in the care plan section)  Progress towards OT goals: Progressing toward goals  Acute Rehab OT Goals Patient Stated Goal: home OT Goal Formulation: With patient Time For Goal Achievement: 01/04/24 Potential to Achieve Goals: Good ADL Goals Pt Will Perform Eating: with modified independence;sitting Pt Will Perform Grooming: with modified independence;sitting Pt Will Perform Upper Body Bathing: with supervision;sitting Pt Will Perform Lower Body Bathing: with contact guard assist;sit to/from stand Pt Will Perform Upper Body Dressing: sitting;with modified independence;with caregiver independent in assisting Pt Will Perform Lower  Body Dressing: with modified independence;with caregiver independent in assisting;sitting/lateral leans;sit to/from stand Pt Will Transfer to Toilet: with contact guard assist;bedside commode Pt Will Perform Toileting - Clothing Manipulation and hygiene: with modified independence;sitting/lateral leans;sit to/from stand  Plan      Co-evaluation                 AM-PAC OT 6 Clicks Daily Activity     Outcome Measure   Help from another person eating meals?: None Help from another person taking care of personal grooming?: A Little Help from another person toileting, which includes using toliet, bedpan, or urinal?: A Little Help from another person bathing (including washing, rinsing, drying)?: A Little Help from another person to put on and taking off regular upper body clothing?: A Little Help from another person to put on and taking off regular lower body clothing?: A Little 6 Click Score: 19    End of Session Equipment Utilized During Treatment: Gait belt;Rolling walker (2 wheels)  OT Visit Diagnosis: Muscle weakness (generalized) (M62.81);Unsteadiness on feet (R26.81)   Activity Tolerance Patient tolerated treatment well   Patient Left in bed;with call bell/phone within reach;with bed alarm set   Nurse Communication Mobility status        Time:  9045-8982 OT Time Calculation (min): 23 min  Charges: OT General Charges $OT Visit: 1 Visit OT Treatments $Self Care/Home Management : 23-37 mins  Kreg Sink, OT/L   Acute OT Clinical Specialist Acute Rehabilitation Services Pager [phone] Office [phone]   [facility] 12/27/2023, 10:24 AM

## 2023-12-27 NOTE — Assessment & Plan Note (Deleted)
 Resume midodrine  with hold parameters (SBP >120) Midodrine  does not appear to be needed, as BP range has been 120s/60-70s

## 2023-12-27 NOTE — Assessment & Plan Note (Addendum)
 Patient with known cryptogenic/NASH cirrhosis which has been decompensated with encephalopathy Presented with confusion and hyperammonemia up to 184 Possibly triggered by UTI Lactulose  is TID without missing doses Resume rifaximin  and spironolactone  Goal 5-7 loose stools per day Improved but had recurrence on 10/8 NH4 remains elevated Improved to close to baseline; anticipate dc in AM

## 2023-12-27 NOTE — Assessment & Plan Note (Deleted)
 Chronic, due to fibroids Continue Megace  40 mg twice daily Patient follow-up with GYN

## 2023-12-27 NOTE — Assessment & Plan Note (Addendum)
 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

## 2023-12-27 NOTE — Assessment & Plan Note (Addendum)
 DNR confirmed at the time of admission Patient will need a gold out of facility DNR form at the time of discharge

## 2023-12-27 NOTE — Assessment & Plan Note (Addendum)
 Acute encephalopathy on presentation likely related to HE as well as UTI Treated with ceftriaxone  -> cefadroxil  Blood cultures negative to date

## 2023-12-27 NOTE — Assessment & Plan Note (Signed)
 Likely anemia of chronic disease +/- chronic blood loss anemia from vaginal bleeding Appears to be stable and at/near baseline

## 2023-12-27 NOTE — Assessment & Plan Note (Deleted)
 Continue atorvastatin 

## 2023-12-27 NOTE — Assessment & Plan Note (Addendum)
 Body mass index is 38.31 kg/m.SABRA  Weight loss should be encouraged Outpatient PCP/bariatric medicine f/u encouraged Significantly low or high BMI is associated with higher medical risk including morbidity and mortality

## 2023-12-27 NOTE — Assessment & Plan Note (Addendum)
 Resume midodrine  with hold parameters (SBP >120)

## 2023-12-27 NOTE — Assessment & Plan Note (Signed)
 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

## 2023-12-28 ENCOUNTER — Other Ambulatory Visit (HOSPITAL_COMMUNITY): Payer: Self-pay

## 2023-12-28 DIAGNOSIS — K7682 Hepatic encephalopathy: Secondary | ICD-10-CM | POA: Diagnosis not present

## 2023-12-28 LAB — CBC
HCT: 32.3 % — ABNORMAL LOW (ref 36.0–46.0)
Hemoglobin: 10.7 g/dL — ABNORMAL LOW (ref 12.0–15.0)
MCH: 32.2 pg (ref 26.0–34.0)
MCHC: 33.1 g/dL (ref 30.0–36.0)
MCV: 97.3 fL (ref 80.0–100.0)
Platelets: 113 K/uL — ABNORMAL LOW (ref 150–400)
RBC: 3.32 MIL/uL — ABNORMAL LOW (ref 3.87–5.11)
RDW: 15 % (ref 11.5–15.5)
WBC: 6.6 K/uL (ref 4.0–10.5)
nRBC: 0 % (ref 0.0–0.2)

## 2023-12-28 LAB — BASIC METABOLIC PANEL WITH GFR
Anion gap: 8 (ref 5–15)
BUN: 18 mg/dL (ref 8–23)
CO2: 17 mmol/L — ABNORMAL LOW (ref 22–32)
Calcium: 8.3 mg/dL — ABNORMAL LOW (ref 8.9–10.3)
Chloride: 113 mmol/L — ABNORMAL HIGH (ref 98–111)
Creatinine, Ser: 0.99 mg/dL (ref 0.44–1.00)
GFR, Estimated: >60 mL/min (ref >60)
Glucose, Bld: 95 mg/dL (ref 70–99)
Potassium: 4.2 mmol/L (ref 3.5–5.1)
Sodium: 138 mmol/L (ref 135–145)

## 2023-12-28 LAB — AMMONIA: Ammonia: 106 umol/L — ABNORMAL HIGH (ref 9–35)

## 2023-12-28 MED ORDER — LACTULOSE 10 GM/15ML PO SOLN
30.0000 g | Freq: Three times a day (TID) | ORAL | 0 refills | Status: AC
  Filled 2023-12-28: qty 236, 2d supply, fill #0

## 2023-12-28 MED ORDER — SODIUM BICARBONATE 650 MG PO TABS
650.0000 mg | ORAL_TABLET | Freq: Two times a day (BID) | ORAL | 0 refills | Status: AC
  Filled 2023-12-28: qty 60, 30d supply, fill #0

## 2023-12-28 MED ORDER — CEFADROXIL 500 MG PO CAPS
500.0000 mg | ORAL_CAPSULE | Freq: Two times a day (BID) | ORAL | 0 refills | Status: AC
  Filled 2023-12-28: qty 2, 1d supply, fill #0

## 2023-12-28 NOTE — Assessment & Plan Note (Addendum)
 Continue atorvastatin 

## 2023-12-28 NOTE — TOC Transition Note (Signed)
 Transition of Care [facility]) - Discharge Note   Patient Details  Name: [name] MRN: [id number] Date of Birth: 1955/02/20  Transition of Care [facility]) CM/SW Contact:  Toy LITTIE Agar, RN Phone Number:[phone]  12/28/2023, 9:41 AM   Clinical Narrative:    Patient with discharge orders. Patient to return home with son who is primary caregiver. HH services to resume with [facility]. No other needs noted. Inpatient care manager to sign off.      Barriers to Discharge: No Barriers Identified   Patient Goals and CMS Choice Patient states their goals for this hospitalization and ongoing recovery are:: Ready to go home   Choice offered to / list presented to : NA [place] ownership interest in [facility].provided to::  (n/a)    Discharge Placement                       Discharge Plan and Services Additional resources added to the After Visit Summary for   In-house Referral: NA Discharge Planning Services: CM Consult Post Acute Care Choice: Home Health (currently active with [facility] and wwill continue services)          DME Arranged: N/A DME Agency: NA       HH Arranged: PT, OT, Nurse's Aide (Active, will resume services) HH Agency: [facility] Date HH Agency Contacted: 12/22/23 Time HH Agency Contacted: 1408 Representative spoke with at [facility] Agency: Arna  Social Drivers of Health (SDOH) Interventions SDOH Screenings   Food Insecurity: No Food Insecurity (12/20/2023)  Housing: Low Risk (12/20/2023)  Transportation Needs: No Transportation Needs (12/20/2023)  Utilities: Not At Risk (12/20/2023)  Alcohol Screen: Low Risk (09/20/2023)  Depression (PHQ2-9): Low Risk (11/07/2023)  Recent Concern: Depression (PHQ2-9) - Medium Risk (09/20/2023)  Financial Resource Strain: Low Risk (09/20/2023)  Physical Activity: Inactive (09/20/2023)  Social Connections: Socially Isolated (12/20/2023)  Stress: No Stress Concern Present (09/20/2023)  Tobacco Use: Medium  Risk (12/20/2023)  Health Literacy: Adequate Health Literacy (09/20/2023)     Readmission Risk Interventions    12/22/2023    1:57 PM 10/31/2023    8:35 AM 10/10/2023    2:52 PM  Readmission Risk Prevention Plan  Transportation Screening Complete Complete Complete  Medication Review Oceanographer) Complete Complete Complete  PCP or Specialist appointment within 3-5 days of discharge Complete  Complete  HRI or Home Care Consult Complete Complete Complete  SW Recovery Care/Counseling Consult Complete Complete Complete  Palliative Care Screening Not Applicable Not Applicable Not Applicable  Skilled Nursing Facility Not Applicable Not Applicable Not Applicable

## 2023-12-28 NOTE — Assessment & Plan Note (Addendum)
 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

## 2023-12-28 NOTE — Assessment & Plan Note (Addendum)
 DNR confirmed at the time of admission Patient will need a gold out of facility DNR form at the time of discharge

## 2023-12-28 NOTE — Care Management Important Message (Signed)
 Important Message  Patient Details IM Letter given. Name: [name] MRN: [id number] Date of Birth: 1955-07-07   Important Message Given:  Yes - Medicare IM     Norvell Ureste 12/28/2023, 9:38 AM

## 2023-12-28 NOTE — Progress Notes (Signed)
 Discharge medications delivered to patient at the bedside.

## 2023-12-28 NOTE — Assessment & Plan Note (Addendum)
 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

## 2023-12-28 NOTE — Assessment & Plan Note (Addendum)
 Acute encephalopathy on presentation likely related to HE as well as UTI Treated with ceftriaxone  -> cefadroxil  Blood cultures negative to date

## 2023-12-28 NOTE — Assessment & Plan Note (Addendum)
 Continue venlafaxine

## 2023-12-28 NOTE — Assessment & Plan Note (Addendum)
 Son reports that she is not currently needing opiates for pain control (patient would like to add back but is ok with holding for now)

## 2023-12-28 NOTE — Assessment & Plan Note (Addendum)
 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

## 2023-12-28 NOTE — Assessment & Plan Note (Addendum)
 Likely anemia of chronic disease +/- chronic blood loss anemia from vaginal bleeding Appears to be stable and at/near baseline

## 2023-12-28 NOTE — Plan of Care (Signed)

## 2023-12-28 NOTE — Discharge Summary (Signed)
 Physician Discharge Summary   Patient: [name] MRN: [id number] DOB: Jan 09, 1956  Admit date:     12/20/2023  Discharge date: 12/28/2023  Discharge Physician: Delon Herald   PCP: [name], MD   Recommendations at discharge:   You are being discharged with home health physical and occupational therapy Continue lactulose  and do not miss doses unless you are having >7 stools per day Complete antibiotics (only need 1 more day) Take bicarbonate twice daily Follow up with Dr. Aletha in 1-2 weeks Follow up with [facility] neurology, nephrology, and GI as scheduled for ongoing evaluation   Discharge Diagnoses: Principal Problem:   Hepatic encephalopathy (HCC) Active Problems:   Normocytic anemia   GERD (gastroesophageal reflux disease)   Chronic hypotension   Class 2 obesity due to excess calories with body mass index (BMI) of 38.0 to 38.9 in adult   DNR (do not resuscitate)/DNI(Do Not intubate)   Vaginal bleeding   Leukoma of left eye   Chronic kidney disease, stage 3a (HCC)   Hypothyroidism   Decompensated cirrhosis (HCC)   Depression   Hyperlipidemia   E. coli UTI (urinary tract infection)   Chronic back pain   Hospital Course: [phone] with h/o NASH cirrhosis with encephalopathy, short gut syndrome with chronic diarrhea, stage 3a CKD with anemia, hypothyroidism, anxiety/depression, chronic hypotension, and HLD who presented on 12/10 with AMS. Elevated NH4, treated with lactulose  for hepatic encephalopathy.  Complicated by E coli UTI.  Mental status now improving.    Assessment and Plan:  Assessment & Plan Hepatic encephalopathy [facility]) Patient with known cryptogenic/NASH cirrhosis which has been decompensated with encephalopathy Presented with confusion and hyperammonemia up to 184 Possibly triggered by UTI Lactulose  is TID without missing doses Resume rifaximin  and spironolactone  Goal 5-7 loose stools per day, currently having more than this Improved to baseline and  feels great, wants to go home as soon as possible today GI has referred her to neurology and nephrology and is doing a work-up for possible Wilson's disease E. 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 Serum bicarb <20 periodically Continue to treat underlying conditions Will add PO HCO3 BID Will need PCP f/u in 1-2 weeks with recheck of labs Hypothyroidism Continue Synthroid  Decompensated cirrhosis (HCC) NASH cirrhosis MELD/MELD-Na score is 13/14, with a mortality rate of 6% Continue propranolol, Rifaximin   Resume diuretics (furosemide , spironolactone ) Chronic hypotension Resume midodrine  with hold parameters (SBP >120) Hyperlipidemia Continue atorvastatin  Depression Continue venlafaxine  Chronic back pain Son reports that she is not currently needing opiates for pain control (patient would like to add back but is ok with holding for now) Vaginal bleeding Chronic, due to fibroids Continue Megace  40 mg twice daily Outpatient follow-up with GYN Class 2 obesity due to excess calories with body mass index (BMI) of 38.0 to 38.9 in adult Body mass index is 38.31 kg/m.SABRA  Weight loss should be encouraged Outpatient PCP/bariatric medicine f/u encouraged Significantly low or high BMI is associated with higher medical risk including morbidity and mortality  DNR (do not resuscitate)/DNI(Do Not intubate) DNR  confirmed at the time of  admission Patient will need a gold out of facility DNR form at the time of discharge      Consultants: PT OT Inpatient case management   Procedures: None   Antibiotics: None   Pain control - [place]  Controlled Substance Reporting System database was reviewed. and patient was instructed, not to drive, operate heavy machinery, perform activities at heights, swimming or participation in water  activities or provide baby-sitting services while on Pain, Sleep and Anxiety Medications; until their outpatient Physician has advised to do so again. Also recommended to not to take more than prescribed Pain, Sleep and Anxiety Medications.   Disposition: Home Diet recommendation:  Regular diet DISCHARGE MEDICATION: Allergies as of 12/28/2023       Reactions   Ace Inhibitors Anaphylaxis, Swelling   Gadolinium Derivatives Anaphylaxis   Ivp Dye [iodinated Contrast Media] Anaphylaxis   Wound Dressing Adhesive Dermatitis   Desitin [zinc  Oxide] Rash, Other (See Comments)   Worsens rash   Nsaids Other (See Comments)   Contraindication due to CKD    Butrans [buprenorphine] Other (See Comments)   Discontinued due to liver function   Yellow Jacket Venom Swelling   Hibiclens  [chlorhexidine  Gluconate] Rash, Other (See Comments)   Worsens rash   Penicillins Dermatitis, Rash   Vibramycin [doxycycline] Rash   Zofran  [ondansetron ] Rash        Medication List     STOP taking these medications    Nucynta  ER 200 MG Tb12 Generic drug: Tapentadol  HCl       TAKE these medications    rOPINIRole  0.25 MG tablet Commonly known as: REQUIP  TAKE 1 TABLET(0.25 MG) BY MOUTH AT BEDTIME The timing of this medication is very important.   acetaminophen  500 MG tablet Commonly known as: TYLENOL  Take 500-1,000 mg by mouth every 6 (six) hours as needed for headache or fever (pain).   albuterol  108 (90 Base) MCG/ACT inhaler Commonly known as: VENTOLIN  HFA Inhale 2 puffs into the lungs every 6  (six) hours as needed for wheezing or shortness of breath.   atorvastatin  40 MG tablet Commonly known as: LIPITOR TAKE 1 TABLET(40 MG) BY MOUTH IN THE MORNING What changed: See the new instructions.   cefadroxil  500 MG capsule Commonly known as: DURICEF Take 1 capsule (500 mg total) by mouth 2 (two) times daily for 1 day.   CENTRUM SILVER  ULTRA WOMENS PO Take 1 tablet by mouth daily with breakfast.   colchicine  0.6 MG tablet Take 0.6 mg by mouth 2 (two) times daily as needed (as directed for gout flares).   copper  tablet Take 2 mg by mouth in the morning and at bedtime.   cyanocobalamin  1000 MCG tablet Commonly known as: VITAMIN B12 Take 2,000 mcg by mouth every Monday, Wednesday, and Friday.   dicyclomine  10 MG capsule Commonly known as: BENTYL  Take 1 capsule (10 mg total) by mouth 3 (three) times daily as needed for spasms (abdominal cramping).   EPINEPHrine  0.3 mg/0.3 mL Soaj injection Commonly known as: EPI-PEN Inject 0.3 mg into the muscle as needed for anaphylaxis.   famotidine  20 MG tablet Commonly known as: PEPCID  Take 20 mg by mouth daily as needed for heartburn or indigestion.   furosemide  40 MG tablet Commonly known as: LASIX  Take 1 tablet (40 mg total) by mouth daily.   HYDROcodone -acetaminophen  10-325 MG tablet Commonly known as: NORCO Take 1 tablet by mouth 3 (three) times daily as needed (for pain- may take up to a sum total of 3  tablets/24 hours).   IRON -VITAMIN C  PO Take 1 tablet by mouth daily.   lactulose  10 GM/15ML solution Commonly known as: CHRONULAC  Take 45 mLs (30 g total) by mouth 3 (three) times daily. What changed: when to take this   levothyroxine  175 MCG tablet Commonly known as: SYNTHROID  Take 1 tablet (175 mcg total) by mouth daily before breakfast. What changed: See the new instructions.   magnesium  gluconate 500 (27 Mg) MG Tabs tablet Commonly known as: MAGONATE Take 1 tablet (500 mg total) by mouth in the morning and at  bedtime. What changed:  how much to take when to take this   meclizine  25 MG tablet Commonly known as: ANTIVERT  Take 1 tablet (25 mg total) by mouth 3 (three) times daily as needed for dizziness.   megestrol  40 MG tablet Commonly known as: MEGACE  Take 1 tablet (40 mg total) by mouth 2 (two) times daily.   methocarbamol  500 MG tablet Commonly known as: ROBAXIN  Take 500 mg by mouth in the morning.   midodrine  10 MG tablet Commonly known as: PROAMATINE  Take 10 mg by mouth 3 (three) times daily.   naloxone 4 MG/0.1ML Liqd nasal spray kit Commonly known as: NARCAN Place 1 spray into the nose as needed (accidental overdose).   nystatin  cream Commonly known as: MYCOSTATIN  Apply 1 Application topically 2 (two) times daily as needed for dry skin. What changed: reasons to take this   pantoprazole  40 MG tablet Commonly known as: PROTONIX  TAKE 1 TABLET(40 MG) BY MOUTH TWICE DAILY BEFORE A MEAL What changed: See the new instructions.   potassium chloride  10 MEQ tablet Commonly known as: KLOR-CON  Take 2 tablets (20 mEq total) by mouth 2 (two) times daily.   rifaximin  550 MG Tabs tablet Commonly known as: XIFAXAN  Take 550 mg by mouth in the morning and at bedtime.   senna-docusate 8.6-50 MG tablet Commonly known as: Senokot-S Take 1 tablet by mouth 2 (two) times daily.   sodium bicarbonate  650 MG tablet Take 1 tablet (650 mg total) by mouth 2 (two) times daily.   spironolactone  50 MG tablet Commonly known as: ALDACTONE  Take 1 tablet (50 mg total) by mouth daily.   triamcinolone  cream 0.1 % Commonly known as: KENALOG  Apply 1 Application topically 2 (two) times daily as needed (skin irritation).   venlafaxine  XR 75 MG 24 hr capsule Commonly known as: EFFEXOR -XR Take 1 capsule (75 mg total) by mouth daily with breakfast.        Contact information for follow-up providers     [name], MD. Schedule an appointment as soon as possible for a visit in 1 week(s).    Specialty: Family Medicine Contact information: [address] [place] [address] [phone]              Contact information for after-discharge care     Home Medical Care     [facility]) .   Service: Home Health Services Contact information: [address] [place]  [address] [phone]                    Discharge Exam:   Subjective: Feeling great and eager to go home today.  She has had an excessive number of stools but feels very clear cognitively.  Able to get herself up and down to the bedside commode as needed.   Objective: Vitals:   12/27/23 2151 12/28/23 0719  BP: (!) 145/69 131/67  Pulse: 83 92  Resp:  18  Temp: 98.2 F (36.8 C) 98.6 F (37 C)  SpO2: 100% 96%    Intake/Output Summary (Last 24 hours) at 12/28/2023 0831 Last data filed at 12/27/2023 1430 Gross per 24 hour  Intake 240 ml  Output --  Net 240 ml   Filed Weights   12/20/23 1138 12/20/23 1700  Weight: 95 kg 95 kg    Exam:  General:  Appears calm and comfortable and is in NAD, sitting on bedside commode Eyes:  normal lids; chronic L visual impairment  ENT:  grossly normal hearing, lips & tongue, mmm Cardiovascular:  RRR. No LE edema.  Respiratory:   CTA bilaterally with no wheezes/rales/rhonchi.  Normal respiratory effort. Abdomen:  soft, NT, ND Skin:  no rash or induration seen on limited exam Musculoskeletal:  grossly normal tone BUE/BLE, good ROM, no bony abnormality Psychiatric:  grossly normal mood and affect, speech fluent and appropriate, AOx3 Neurologic:  CN 2-12 grossly intact, moves all extremities in coordinated fashion  Data Reviewed: I have reviewed the patient's lab results since admission.  Pertinent labs for today include:  CO2 17 WBC 6.6  Hgb 10.7, stable Platelets 113     Condition at discharge: improving  The results of significant diagnostics from this  hospitalization (including imaging, microbiology, ancillary and laboratory) are listed below for reference.   Imaging Studies: DG Foot 2 Views Right Result Date: 12/22/2023 EXAM: 1 or 2 VIEW(S) XRAY OF THE RIGHT FOOT 12/22/2023 01:59:00 PM COMPARISON: None available. CLINICAL HISTORY: Injury of right great toe. FINDINGS: BONES AND JOINTS: There is profound bone density loss. This limits assessment for nondisplaced fractures but no displaced acute foot fracture is seen. There is a healed fracture deformity of the third toe proximal phalanx. There are mild degenerative changes in the forefoot but no erosive arthropathy. There is a prominent noninflammatory plantar calcaneal spur. Mild midfoot arthrosis. SOFT TISSUES: There is mild generalized soft tissue edema. Vascular calcifications extending into the base of the toes, heavy calcification in the anterior and posterior tibial arteries in the distal foreleg. IMPRESSION: 1. No displaced acute foot fracture. Assessment for nondisplaced fractures is limited by profound bone density loss. 2. Healed fracture deformity of the third toe proximal phalanx. 3. Vascular calcifications extending into the base of the toes with heavy calcification in the anterior and posterior tibial arteries in the distal foreleg. Electronically signed by: Francis Quam MD 12/22/2023 09:12 PM EST RP Workstation: [id number]   CT Head Wo Contrast Result Date: 12/20/2023 CLINICAL DATA:  L2 altered mental status. EXAM: CT HEAD WITHOUT CONTRAST TECHNIQUE: Contiguous axial images were obtained from the base of the skull through the vertex without intravenous contrast. RADIATION DOSE REDUCTION: This exam was performed according to the departmental dose-optimization program which includes automated exposure control, adjustment of the mA and/or kV according to patient size and/or use of iterative reconstruction technique. COMPARISON:  December 14, 2023 FINDINGS: Brain: There is generalized cerebral  atrophy with widening of the extra-axial spaces and ventricular dilatation. There are areas of decreased attenuation within the white matter tracts of the supratentorial brain, consistent with microvascular disease changes. Vascular: Marked severity calcification of the bilateral cavernous carotid arteries, the visualized portion of the left vertebral artery and proximal portion of the basilar artery is noted. Skull: Normal. Negative for fracture or focal lesion. Sinuses/Orbits: No acute finding. Other: None. IMPRESSION: 1. Generalized cerebral atrophy with chronic white matter small vessel ischemic changes. 2. No acute intracranial abnormality. Electronically Signed  By: Suzen Dials M.D.   On: 12/20/2023 13:27   DG Chest Portable 1 View Result Date: 12/20/2023 CLINICAL DATA:  Altered mental status. EXAM: PORTABLE CHEST 1 VIEW COMPARISON:  December 14, 2023 FINDINGS: The cardiac silhouette is mildly enlarged and unchanged in size. There is marked severity calcification of the aortic arch. No acute infiltrate, pleural effusion or pneumothorax is identified. The visualized skeletal structures are unremarkable. IMPRESSION: Stable cardiomegaly without acute or active cardiopulmonary disease. Electronically Signed   By: Suzen Dials M.D.   On: 12/20/2023 13:23   CT Head Wo Contrast Result Date: 12/14/2023 EXAM: CT HEAD WITHOUT CONTRAST 12/14/2023 04:53:00 PM TECHNIQUE: CT of the head was performed without the administration of intravenous contrast. Automated exposure control, iterative reconstruction, and/or weight based adjustment of the mA/kV was utilized to reduce the radiation dose to as low as reasonably achievable. COMPARISON: Head CT 10/29/2023 and MRI 10/17/2022. CLINICAL HISTORY: Mental status change, unknown cause. FINDINGS: BRAIN AND VENTRICLES: There is no evidence of an acute infarct, intracranial hemorrhage, mass, midline shift, hydrocephalus, or extra-axial fluid collection. Cerebral  volume is within normal limits for age. Patchy hypodensities in the cerebral white matter are similar to the prior CT and nonspecific but compatible with moderate chronic small vessel ischemic disease. Calcified atherosclerosis at the skull base. ORBITS: Left cataract extraction. SINUSES: No acute abnormality. SOFT TISSUES AND SKULL: No acute soft tissue abnormality. No skull fracture. IMPRESSION: 1. No acute intracranial abnormality. 2. Moderate chronic small vessel ischemic disease. Electronically signed by: Dasie Hamburg MD 12/14/2023 05:45 PM EST RP Workstation: [id number]   DG Chest Portable 1 View Result Date: 12/14/2023 CLINICAL DATA:  Altered mental status EXAM: PORTABLE CHEST 1 VIEW COMPARISON:  Prior chest x-ray 10/29/2023 FINDINGS: The heart size and mediastinal contours are within normal limits. Both lungs are clear save for mild chronic bronchitic changes. The visualized skeletal structures are unremarkable. Atherosclerotic vascular calcifications present in the transverse aorta. IMPRESSION: No active disease. Electronically Signed   By: Wilkie Lent M.D.   On: 12/14/2023 15:39    Microbiology: Results for orders placed or performed during the hospital encounter of 12/20/23  Urine Culture     Status: Abnormal   Collection Time: 12/20/23  2:56 PM   Specimen: Urine, Random  Result Value Ref Range Status   Specimen Description   Final    URINE, RANDOM Performed at [facility], [address] [place], [place] [address]    Special Requests   Final    NONE Reflexed from [id number] Performed at [facility], [address] [place], [place] [address]    Culture >=100,000 COLONIES/mL ESCHERICHIA COLI (A)  Final   Report Status 12/22/2023 FINAL  Final   Organism ID, Bacteria ESCHERICHIA COLI (A)  Final      Susceptibility   Escherichia coli - MIC*    AMPICILLIN 4 SENSITIVE Sensitive     CEFAZOLIN  (URINE) Value in next row Sensitive      <=1  SENSITIVEThis is a modified FDA-approved test that has been validated and its performance characteristics determined by the reporting laboratory.  This laboratory is certified under the Clinical Laboratory Improvement Amendments CLIA as qualified to perform high complexity clinical laboratory testing.    CEFEPIME  Value in next row Sensitive      <=1 SENSITIVEThis is a modified FDA-approved test that has been validated and its performance characteristics determined by the reporting laboratory.  This laboratory is certified under the Clinical Laboratory Improvement Amendments CLIA as  qualified to perform high complexity clinical laboratory testing.    ERTAPENEM Value in next row Sensitive      <=1 SENSITIVEThis is a modified FDA-approved test that has been validated and its performance characteristics determined by the reporting laboratory.  This laboratory is certified under the Clinical Laboratory Improvement Amendments CLIA as qualified to perform high complexity clinical laboratory testing.    CEFTRIAXONE  Value in next row Sensitive      <=1 SENSITIVEThis is a modified FDA-approved test that has been validated and its performance characteristics determined by the reporting laboratory.  This laboratory is certified under the Clinical Laboratory Improvement Amendments CLIA as qualified to perform high complexity clinical laboratory testing.    CIPROFLOXACIN  Value in next row Sensitive      <=1 SENSITIVEThis is a modified FDA-approved test that has been validated and its performance characteristics determined by the reporting laboratory.  This laboratory is certified under the Clinical Laboratory Improvement Amendments CLIA as qualified to perform high complexity clinical laboratory testing.    GENTAMICIN Value in next row Sensitive      <=1 SENSITIVEThis is a modified FDA-approved test that has been validated and its performance characteristics determined by the reporting laboratory.  This laboratory is  certified under the Clinical Laboratory Improvement Amendments CLIA as qualified to perform high complexity clinical laboratory testing.    NITROFURANTOIN Value in next row Sensitive      <=1 SENSITIVEThis is a modified FDA-approved test that has been validated and its performance characteristics determined by the reporting laboratory.  This laboratory is certified under the Clinical Laboratory Improvement Amendments CLIA as qualified to perform high complexity clinical laboratory testing.    TRIMETH /SULFA  Value in next row Resistant      <=1 SENSITIVEThis is a modified FDA-approved test that has been validated and its performance characteristics determined by the reporting laboratory.  This laboratory is certified under the Clinical Laboratory Improvement Amendments CLIA as qualified to perform high complexity clinical laboratory testing.    AMPICILLIN/SULBACTAM Value in next row Sensitive      <=1 SENSITIVEThis is a modified FDA-approved test that has been validated and its performance characteristics determined by the reporting laboratory.  This laboratory is certified under the Clinical Laboratory Improvement Amendments CLIA as qualified to perform high complexity clinical laboratory testing.    PIP/TAZO Value in next row Sensitive      <=4 SENSITIVEThis is a modified FDA-approved test that has been validated and its performance characteristics determined by the reporting laboratory.  This laboratory is certified under the Clinical Laboratory Improvement Amendments CLIA as qualified to perform high complexity clinical laboratory testing.    MEROPENEM  Value in next row Sensitive      <=4 SENSITIVEThis is a modified FDA-approved test that has been validated and its performance characteristics determined by the reporting laboratory.  This laboratory is certified under the Clinical Laboratory Improvement Amendments CLIA as qualified to perform high complexity clinical laboratory testing.    * >=100,000  COLONIES/mL ESCHERICHIA COLI  Culture, blood (Routine X 2) w Reflex to ID Panel     Status: None   Collection Time: 12/21/23 10:25 AM   Specimen: BLOOD RIGHT HAND  Result Value Ref Range Status   Specimen Description   Final    BLOOD RIGHT HAND Performed at [facility], [address], [place], [place] [address]    Special Requests   Final    BOTTLES DRAWN AEROBIC ONLY Blood Culture results may not be optimal due to an  inadequate volume of blood received in culture bottles Performed at [name], [address], [place], [place] [address]    Culture   Final    NO GROWTH 5 DAYS Performed at [facility], [address] [place], [place] [address]    Report Status 12/26/2023 FINAL  Final  Culture, blood (Routine X 2) w Reflex to ID Panel     Status: None   Collection Time: 12/21/23 10:31 AM   Specimen: BLOOD RIGHT HAND  Result Value Ref Range Status   Specimen Description   Final    BLOOD RIGHT HAND Performed at [facility], [address] [place], [place] [address]    Special Requests   Final    BOTTLES DRAWN AEROBIC ONLY Blood Culture results may not be optimal due to an inadequate volume of blood received in culture bottles Performed at [facility], [address] [place], [place] [address]    Culture   Final    NO GROWTH 5 DAYS Performed at [facility], [address], [place], [place] [address]    Report Status 12/26/2023 FINAL  Final    Labs: CBC: Recent Labs  Lab 12/22/23 0607 12/23/23 0722 12/24/23 0740 12/25/23 0500 12/26/23 0555 12/28/23 0715  WBC 8.1 6.1 7.0 6.9 6.8 6.6  NEUTROABS 4.2 3.4 3.7 3.3 3.4  --   HGB 11.1* 10.8* 10.9* 11.6* 10.6* 10.7*  HCT 35.1* 33.9* 32.7* 36.0 33.8* 32.3*  MCV 98.6 97.1 96.2 95.5 98.0 97.3  PLT 147* 127* 120* 141* 125* 113*   Basic Metabolic Panel: Recent Labs  Lab 12/22/23 0607 12/23/23 0722 12/24/23 0740 12/25/23 0500 12/26/23 0555 12/28/23 0715   NA 139 140 137 139 141 138  K 3.8 3.8 3.8 4.0 3.9 4.2  CL 113* 114* 113* 114* 115* 113*  CO2 15* 18* 17* 18* 18* 17*  GLUCOSE 94 118* 121* 90 117* 95  BUN 23 20 21 19 18 18   CREATININE 1.10* 1.12* 1.09* 0.98 1.22* 0.99  CALCIUM  8.4* 8.1* 8.1* 8.5* 8.2* 8.3*  MG 1.9 1.7 1.6* 1.6* 1.6*  --    Liver Function Tests: Recent Labs  Lab 12/22/23 0607 12/23/23 0722 12/24/23 0740 12/25/23 0500 12/26/23 0555  AST 43* 28 32 30 27  ALT 23 20 23 21 19   ALKPHOS 116 118 120 122 113  BILITOT 1.4* 0.9 1.1 1.3* 1.2  PROT 5.1* 4.8* 4.9* 5.3* 4.8*  ALBUMIN  2.8* 2.6* 2.7* 2.7* 2.7*   CBG: No results for input(s): GLUCAP in the last 168 hours.  Discharge time spent: greater than 30 minutes.  Signed: Delon Herald, MD [facility] 12/28/2023

## 2023-12-28 NOTE — Assessment & Plan Note (Addendum)
 Resume midodrine  with hold parameters (SBP >120)

## 2023-12-28 NOTE — Assessment & Plan Note (Addendum)
 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

## 2023-12-28 NOTE — Assessment & Plan Note (Addendum)
 Body mass index is 38.31 kg/m.SABRA  Weight loss should be encouraged Outpatient PCP/bariatric medicine f/u encouraged Significantly low or high BMI is associated with higher medical risk including morbidity and mortality

## 2023-12-28 NOTE — Assessment & Plan Note (Addendum)
 Chronic, due to fibroids Continue Megace  40 mg twice daily Outpatient follow-up with GYN

## 2023-12-28 NOTE — Assessment & Plan Note (Addendum)
 Continue Synthroid 

## 2023-12-29 ENCOUNTER — Telehealth: Payer: Self-pay

## 2023-12-29 NOTE — Patient Instructions (Addendum)
 Visit Information  Thank you for taking time to visit with me today. Please don't hesitate to contact me if I can be of assistance to you before our next scheduled telephone appointment.  Our next appointment is by telephone on January 08, 2024 at 1300  Following is a copy of your care plan:   Goals Addressed             This Visit's Progress    VBCI Transitions of Care (TOC) Care Plan       Problems:  Recent Hospitalization for treatment of hepatic encephalopathy/ cirrhosis; UTI Per patient and patient's son [name] DPR who reports patient is doing fine, reports compliance with lactulose  and pt having several bowel movements per day, blood pressure is being checked daily by son 12/29/23 Encouraged ongoing BP monitoring, son states will check when patient awakens again, she just went back to sleep after her before breakfast medications. 12/13/23-- no new concerns reported today, states  everything going well , per son, pt may be evaluated for transplant 12/29/23 Wants ongoing follow up with [facility] RN.  Goal:  Over the next 30 days, the patient will not experience hospital readmission  Interventions:  Transitions of Care: Doctor Visits  - discussed the importance of doctor visits Reviewed Signs and symptoms of infection Evaluation of current treatment plan related to acute hepatic encephalopathy, cirrhosis, self-management and patient's adherence to plan as established by provider. Discussed plans with patient for ongoing care management follow up and provided patient with direct contact information for care management team Evaluation of current treatment plan related to cirrhosis  and patient's adherence to plan as established by provider Reviewed medications with patient and discussed importance of taking all medications as prescribed.  Reviewed compliance with lactulose  and importance of regular bowel movements to keep ammonia levels in check. Reviewed scheduled/upcoming provider  appointments  Reinofrced low sodium diet Reviewed importance of daily weight and keeping a log Reviewed importance of adequate protein in diet  Reinforced bleeding risk due to cirrhosis Encouraged to observe for any signs of infection, confusion and get medical treatment for infections asap Pain assessment completed 12/29/23 Patient son, [name] wanted to restart [facility] program stated with previous [facility].  Patient Self Care Activities:  Attend all scheduled provider appointments Attend church or other social activities Call pharmacy for medication refills 3-7 days in advance of running out of medications Call provider office for new concerns or questions  Notify RN Care Manager of TOC call rescheduling needs Participate in Transition of Care Program/Attend TOC scheduled calls Perform all self care activities independently  Take medications as prescribed   Work with the social worker to address care coordination needs and will continue to work with the clinical team to address health care and disease management related needs Do not skip lactulose , it is important to keep ammonia levels in check Provide adequate protein in diet  Get emergency assistance for bleeding- rectal blood, dark black stools or vomiting of blood, confusion (increase in ammonia level)  Plan: 12/29/23 Restart TOC: Discussed and offered 30 day TOC program.  Patient/son [name] agrees to weekly calls.   The patient has been provided with contact information for the care management team and has been advised to call with any health -related questions or concerns.  The patient verbalized understanding with current plan of care.  The patient is directed to their insurance card regarding availability of benefits coverage.   12/29/23 Patient scheduled for follow up with Mliss Creed, RN for [facility]  program for January 08, 2024 at 1300 per son for after holiday and had appointments next week to go to.        Patient verbalizes  understanding of instructions and care plan provided today and agrees to view in MyChart. Active MyChart status and patient understanding of how to access instructions and care plan via MyChart confirmed with patient.     The patient has been provided with contact information for the care management team and has been advised to call with any health related questions or concerns.   Please call the care guide team at [phone] if you need to cancel or reschedule your appointment.   Please call the Suicide and Crisis Lifeline: 988 call the [place]  National Suicide Prevention Lifeline: 424 409 7963 or TTY: (808) 326-6368 TTY 7122468286) to talk to a trained counselor call 1-800-273-TALK (toll free, 24 hour hotline) go to [facility] [address] [place] [phone]) call 911 if you are experiencing a Mental Health or Behavioral Health Crisis or need someone to talk to.   Richerd Fish, RN, BSN, CCM [facility], [facility] Management Coordinator Direct Dial: [phone]

## 2023-12-29 NOTE — Transitions of Care (Post Inpatient/ED Visit) (Addendum)
 "  12/29/2023  Name: [name] MRN: [id number] DOB: 12/31/1955  Today's TOC FU Call Status: TOC FU Call Complete Date: 12/29/23  Patient's Name and Date of Birth confirmed. Name, DOB  Transition Care Management Follow-up Telephone Call Date of Discharge: 12/28/23 Discharge Facility: [facility]) Type of Discharge: Inpatient Admission Primary Inpatient Discharge Diagnosis:: hepatic encephalopathy How have you been since you were released from the hospital?: Better Any questions or concerns?: No  Items Reviewed: Did you receive and understand the discharge instructions provided?: Yes Medications obtained,verified, and reconciled?: Yes (Medications Reviewed) Any new allergies since your discharge?: No Dietary orders reviewed?: Yes Type of Diet Ordered:: heart healthy Do you have support at home?: Yes People in Home [RPT]: child(ren), adult Name of Support/Comfort Primary Source: son - [name]  Medications Reviewed Today: Medications Reviewed Today     Reviewed by Eilleen Richerd GRADE, RN (Registered Nurse) on 12/29/23 at 0920  Med List Status: <None>   Medication Order Taking? Sig Documenting Provider Last Dose Status Informant  acetaminophen  (TYLENOL ) 500 MG tablet [id number] Yes Take 500-1,000 mg by mouth every 6 (six) hours as needed for headache or fever (pain). [provider]  Active Family Member, Pharmacy Records  albuterol  (VENTOLIN  HFA) [phone] Base) MCG/ACT inhaler [id number] Yes Inhale 2 puffs into the lungs every 6 (six) hours as needed for wheezing or shortness of breath. [name], MD  Active Family Member, Pharmacy Records  atorvastatin  (LIPITOR) 40 MG tablet [id number]  TAKE 1 TABLET(40 MG) BY MOUTH IN THE MORNING  Patient taking differently: Take 40 mg by mouth in the morning.   [name], MD  Active Family Member, Pharmacy Records  cefadroxil  (DURICEF) 500 MG capsule [id number] Yes Take 1 capsule (500 mg total) by mouth 2 (two) times daily for  1 day. Barbarann Nest, MD  Active   colchicine  0.6 MG tablet [id number] Yes Take 0.6 mg by mouth 2 (two) times daily as needed (as directed for gout flares). [provider]  Active Family Member, Pharmacy Records  copper  tablet [id number] Yes Take 2 mg by mouth in the morning and at bedtime. [provider]  Active Family Member, Pharmacy Records  cyanocobalamin  (VITAMIN B12) 1000 MCG tablet [id number] Yes Take 2,000 mcg by mouth every Monday, Wednesday, and Friday. [provider]  Active Family Member, Pharmacy Records  dicyclomine  (BENTYL ) 10 MG capsule [id number] Yes Take 1 capsule (10 mg total) by mouth 3 (three) times daily as needed for spasms (abdominal cramping). [name], MD  Active Family Member, Pharmacy Records  EPINEPHrine  0.3 mg/0.3 mL IJ SOAJ injection [id number] Yes Inject 0.3 mg into the muscle as needed for anaphylaxis. [name], MD  Active Family Member, Pharmacy Records  famotidine  (PEPCID ) 20 MG tablet [id number] Yes Take 20 mg by mouth daily as needed for heartburn or indigestion. [provider]  Active Family Member, Pharmacy Records  furosemide  (LASIX ) 40 MG tablet [id number] Yes Take 1 tablet (40 mg total) by mouth daily. Jillian Buttery, MD  Active Family Member, Pharmacy Records  HYDROcodone -acetaminophen  [facility]) 10-325 MG tablet [id number] Yes Take 1 tablet by mouth 3 (three) times daily as needed (for pain- may take up to a sum total of 3 tablets/24 hours). [provider]  Active Family Member, Pharmacy Records           Med Note Walls, RICHERD GRADE   Fri Dec 29, 2023  9:11 AM) Only as needed  IRON -VITAMIN C  PO [id number] Yes Take 1 tablet by mouth daily.  [provider]  Active Family Member, Pharmacy Records  lactulose  (CHRONULAC ) 10 GM/15ML solution [id number] Yes Take 45 mLs (30 g total) by mouth 3 (three) times daily. Barbarann Nest, MD  Active   levothyroxine  (SYNTHROID ) 175 MCG tablet [id number] Yes Take 1  tablet (175 mcg total) by mouth daily before breakfast. [name], MD  Active Family Member, Pharmacy Records  magnesium  gluconate (MAGONATE) 500 (27 Mg) MG TABS tablet [id number]  Take 1 tablet (500 mg total) by mouth in the morning and at bedtime.  Patient taking differently: Take 1,000 mg by mouth in the morning.   [name], MD  Active Family Member, Pharmacy Records  meclizine  (ANTIVERT ) 25 MG tablet [id number] Yes Take 1 tablet (25 mg total) by mouth 3 (three) times daily as needed for dizziness. Jillian Buttery, MD  Active Family Member, Pharmacy Records  megestrol  (MEGACE ) 40 MG tablet [id number] Yes Take 1 tablet (40 mg total) by mouth 2 (two) times daily. Ajewole, [name], MD  Active Family Member, Pharmacy Records  methocarbamol  (ROBAXIN ) 500 MG tablet [id number] Yes Take 500 mg by mouth in the morning. [provider]  Active Family Member, Pharmacy Records  midodrine  (PROAMATINE ) 10 MG tablet [id number] Yes Take 10 mg by mouth 3 (three) times daily. [provider]  Active Family Member, Pharmacy Records           Med Note MARISA, NATHANEL LOISE Schaumann Dec 14, 2023  8:25 PM)    Multiple Vitamins-Minerals (CENTRUM SILVER  ULTRA WOMENS PO) [id number] Yes Take 1 tablet by mouth daily with breakfast. [provider]  Active Family Member, Pharmacy Records  naloxone [facility]) nasal spray 4 mg/0.1 mL [id number] Yes Place 1 spray into the nose as needed (accidental overdose). [provider]  Active Family Member, Pharmacy Records           Med Note [facility] [place], [place]   Wed Mar 08, 2023  2:39 PM)    nystatin  cream (MYCOSTATIN ) [id number]  Apply 1 Application topically 2 (two) times daily as needed for dry skin.  Patient taking differently: Apply 1 Application topically 2 (two) times daily as needed (for irritation).   Cheryle Page, MD  Active Family Member, Pharmacy Records           Med Note [place], RICHERD GRADE   Fri Dec 29, 2023  9:10 AM) As needed   pantoprazole  (PROTONIX ) 40 MG tablet [id number] Yes TAKE 1 TABLET(40 MG) BY MOUTH TWICE DAILY BEFORE A MEAL [name], MD  Active Family Member, Pharmacy Records  potassium chloride  (KLOR-CON ) 10 MEQ tablet [id number]  Take 2 tablets (20 mEq total) by mouth 2 (two) times daily. [name], MD  Active Family Member, Pharmacy Records  rifaximin  (XIFAXAN ) 550 MG TABS tablet [id number] Yes Take 550 mg by mouth in the morning and at bedtime. [provider]  Active Family Member, Pharmacy Records  rOPINIRole  (REQUIP ) 0.25 MG tablet [id number] Yes TAKE 1 TABLET(0.25 MG) BY MOUTH AT BEDTIME [name], MD  Active Family Member, Pharmacy Records  senna-docusate (SENOKOT-S) 8.6-50 MG tablet [id number] Yes Take 1 tablet by mouth 2 (two) times daily. Duanne Butler DASEN, MD  Active Family Member, Pharmacy Records  sodium bicarbonate  650 MG tablet [id number] Yes Take 1 tablet (650 mg total) by mouth 2 (two) times daily. Barbarann Nest, MD  Active   spironolactone  (ALDACTONE ) 50 MG tablet [id number] Yes Take 1 tablet (50 mg total) by mouth daily. [name], MD  Active Family Member, Pharmacy Records  triamcinolone  cream (KENALOG ) 0.1 % [id number] Yes Apply 1 Application topically 2 (two) times daily as needed (skin irritation). [provider]  Active Family Member, Pharmacy Records  venlafaxine  XR (EFFEXOR -XR) 75 MG 24 hr capsule [id number] Yes Take 1 capsule (75 mg total) by mouth daily with breakfast. Duanne Butler DASEN, MD  Active Family Member, Pharmacy Records  Med List Note Geri, Jon HERO, CPhT 10/30/23 9080): [name] (son) handles all medication            Home Care and Equipment/Supplies: Were Home Health Services Ordered?: Yes Name of Home Health Agency:: [facility] Has Agency set up a time to come to your home?: No First Home Health Visit Date:  Vilinda states he has the contact number for [facility] team member if they don't call later today he will call them) EMR  reviewed for Home Health Orders: Orders present/patient has not received call (refer to CM for follow-up) Any new equipment or medical supplies ordered?: No  Functional Questionnaire: Do you need assistance with bathing/showering or dressing?: No Do you need assistance with meal preparation?: Yes Do you need assistance with eating?: No Do you have difficulty maintaining continence: No Do you need assistance with getting out of bed/getting out of a chair/moving?: No Do you have difficulty managing or taking your medications?: No  Follow up appointments reviewed: PCP Follow-up appointment confirmed?: No ([name] states he will call for follow up appointment, patient was in hospital on 12/27/23 appt,) Specialist Hospital Follow-up appointment confirmed?: NA Do you need transportation to your follow-up appointment?: No Do you understand care options if your condition(s) worsen?: Yes-patient verbalized understanding (Interview per son, patient asleep, DPR reviewed, HIPAA approved)  SDOH Interventions Today    Flowsheet Row Most Recent Value  SDOH Interventions   Food Insecurity Interventions Intervention Not Indicated  Housing Interventions Intervention Not Indicated  Transportation Interventions Intervention Not Indicated  Utilities Interventions Intervention Not Indicated    Goals Addressed             This Visit's Progress    VBCI Transitions of Care (TOC) Care Plan       Problems:  Recent Hospitalization for treatment of hepatic encephalopathy/ cirrhosis; UTI Per patient and patient's son [name] DPR who reports patient is doing fine, reports compliance with lactulose  and pt having several bowel movements per day, blood pressure is being checked daily by son 12/29/23 Encouraged ongoing BP monitoring, son states will check when patient awakens again, she just went back to sleep after her before breakfast medications. 12/13/23-- no new concerns reported today, states  everything going  well , per son, pt may be evaluated for transplant 12/29/23 Wants ongoing follow up with [facility] RN.  Goal:  Over the next 30 days, the patient will not experience hospital readmission  Interventions:  Transitions of Care: Doctor Visits  - discussed the importance of doctor visits Reviewed Signs and symptoms of infection Evaluation of current treatment plan related to acute hepatic encephalopathy, cirrhosis, self-management and patient's adherence to plan as established by provider. Discussed plans with patient for ongoing care management follow up and provided patient with direct contact information for care management team Evaluation of current treatment plan related to cirrhosis  and patient's adherence to plan as established by provider Reviewed medications with patient and discussed importance of taking all medications as prescribed.  Reviewed compliance with lactulose  and importance of regular bowel movements to keep ammonia levels in check. Reviewed scheduled/upcoming provider appointments  Reinofrced low sodium diet Reviewed importance of  daily weight and keeping a log Reviewed importance of adequate protein in diet  Reinforced bleeding risk due to cirrhosis Encouraged to observe for any signs of infection, confusion and get medical treatment for infections asap Pain assessment completed 12/29/23 Patient son, [name] wanted to restart [facility] program stated with previous RN.  Patient Self Care Activities:  Attend all scheduled provider appointments Attend church or other social activities Call pharmacy for medication refills 3-7 days in advance of running out of medications Call provider office for new concerns or questions  Notify RN Care Manager of TOC call rescheduling needs Participate in Transition of Care Program/Attend TOC scheduled calls Perform all self care activities independently  Take medications as prescribed   Work with the social worker to address care coordination needs  and will continue to work with the clinical team to address health care and disease management related needs Do not skip lactulose , it is important to keep ammonia levels in check Provide adequate protein in diet  Get emergency assistance for bleeding- rectal blood, dark black stools or vomiting of blood, confusion (increase in ammonia level)  Plan: 12/29/23 Restart TOC: Discussed and offered 30 day TOC program.  Patient/son [name] agrees to weekly calls.   The patient has been provided with contact information for the care management team and has been advised to call with any health -related questions or concerns.  The patient verbalized understanding with current plan of care.  The patient is directed to their insurance card regarding availability of benefits coverage.   12/29/23 Patient scheduled for follow up with Mliss Creed, RN for [facility] program for January 08, 2024 at 1300 per son for after holiday and had appointments next week to go to.        Richerd Fish, RN, BSN, CCM [facility], [facility] Management Coordinator Direct Dial: [phone]        "

## 2024-01-02 ENCOUNTER — Emergency Department (HOSPITAL_COMMUNITY): Payer: Medicare (Managed Care)

## 2024-01-02 ENCOUNTER — Other Ambulatory Visit: Payer: Self-pay

## 2024-01-02 ENCOUNTER — Inpatient Hospital Stay (HOSPITAL_COMMUNITY)
Admission: EM | Admit: 2024-01-02 | Discharge: 2024-01-06 | Disposition: A | Discharge status: Home / Self Care | Payer: Medicare (Managed Care) | Attending: Internal Medicine | Admitting: Internal Medicine

## 2024-01-02 ENCOUNTER — Encounter (HOSPITAL_COMMUNITY): Payer: Self-pay | Admitting: *Deleted

## 2024-01-02 DIAGNOSIS — R9431 Abnormal electrocardiogram [ECG] [EKG]: Secondary | ICD-10-CM | POA: Diagnosis not present

## 2024-01-02 DIAGNOSIS — Z880 Allergy status to penicillin: Secondary | ICD-10-CM

## 2024-01-02 DIAGNOSIS — I9589 Other hypotension: Secondary | ICD-10-CM | POA: Diagnosis present

## 2024-01-02 DIAGNOSIS — Z881 Allergy status to other antibiotic agents status: Secondary | ICD-10-CM

## 2024-01-02 DIAGNOSIS — K219 Gastro-esophageal reflux disease without esophagitis: Secondary | ICD-10-CM | POA: Diagnosis present

## 2024-01-02 DIAGNOSIS — K7581 Nonalcoholic steatohepatitis (NASH): Secondary | ICD-10-CM | POA: Diagnosis present

## 2024-01-02 DIAGNOSIS — Z79890 Hormone replacement therapy (postmenopausal): Secondary | ICD-10-CM

## 2024-01-02 DIAGNOSIS — K7460 Unspecified cirrhosis of liver: Secondary | ICD-10-CM | POA: Diagnosis present

## 2024-01-02 DIAGNOSIS — Z886 Allergy status to analgesic agent status: Secondary | ICD-10-CM

## 2024-01-02 DIAGNOSIS — Z8049 Family history of malignant neoplasm of other genital organs: Secondary | ICD-10-CM

## 2024-01-02 DIAGNOSIS — N1831 Chronic kidney disease, stage 3a: Secondary | ICD-10-CM | POA: Diagnosis present

## 2024-01-02 DIAGNOSIS — Z87892 Personal history of anaphylaxis: Secondary | ICD-10-CM

## 2024-01-02 DIAGNOSIS — F32A Depression, unspecified: Secondary | ICD-10-CM | POA: Diagnosis present

## 2024-01-02 DIAGNOSIS — E669 Obesity, unspecified: Secondary | ICD-10-CM | POA: Diagnosis present

## 2024-01-02 DIAGNOSIS — N179 Acute kidney failure, unspecified: Secondary | ICD-10-CM | POA: Diagnosis not present

## 2024-01-02 DIAGNOSIS — K7682 Hepatic encephalopathy: Principal | ICD-10-CM | POA: Diagnosis present

## 2024-01-02 DIAGNOSIS — Z86711 Personal history of pulmonary embolism: Secondary | ICD-10-CM

## 2024-01-02 DIAGNOSIS — Z66 Do not resuscitate: Secondary | ICD-10-CM | POA: Diagnosis present

## 2024-01-02 DIAGNOSIS — Z79899 Other long term (current) drug therapy: Secondary | ICD-10-CM

## 2024-01-02 DIAGNOSIS — Z8249 Family history of ischemic heart disease and other diseases of the circulatory system: Secondary | ICD-10-CM

## 2024-01-02 DIAGNOSIS — R5381 Other malaise: Secondary | ICD-10-CM | POA: Diagnosis present

## 2024-01-02 DIAGNOSIS — K90829 Short bowel syndrome, unspecified: Secondary | ICD-10-CM | POA: Diagnosis present

## 2024-01-02 DIAGNOSIS — K7469 Other cirrhosis of liver: Secondary | ICD-10-CM | POA: Diagnosis present

## 2024-01-02 DIAGNOSIS — D631 Anemia in chronic kidney disease: Secondary | ICD-10-CM | POA: Diagnosis present

## 2024-01-02 DIAGNOSIS — Z888 Allergy status to other drugs, medicaments and biological substances status: Secondary | ICD-10-CM

## 2024-01-02 DIAGNOSIS — E785 Hyperlipidemia, unspecified: Secondary | ICD-10-CM | POA: Diagnosis present

## 2024-01-02 DIAGNOSIS — Z83438 Family history of other disorder of lipoprotein metabolism and other lipidemia: Secondary | ICD-10-CM

## 2024-01-02 DIAGNOSIS — Z87440 Personal history of urinary (tract) infections: Secondary | ICD-10-CM

## 2024-01-02 DIAGNOSIS — Z9109 Other allergy status, other than to drugs and biological substances: Secondary | ICD-10-CM

## 2024-01-02 DIAGNOSIS — G8929 Other chronic pain: Secondary | ICD-10-CM | POA: Diagnosis present

## 2024-01-02 DIAGNOSIS — Z87442 Personal history of urinary calculi: Secondary | ICD-10-CM

## 2024-01-02 DIAGNOSIS — E039 Hypothyroidism, unspecified: Secondary | ICD-10-CM | POA: Diagnosis present

## 2024-01-02 DIAGNOSIS — Z91041 Radiographic dye allergy status: Secondary | ICD-10-CM

## 2024-01-02 DIAGNOSIS — Z1152 Encounter for screening for COVID-19: Secondary | ICD-10-CM

## 2024-01-02 DIAGNOSIS — I129 Hypertensive chronic kidney disease with stage 1 through stage 4 chronic kidney disease, or unspecified chronic kidney disease: Secondary | ICD-10-CM | POA: Diagnosis present

## 2024-01-02 DIAGNOSIS — R053 Chronic cough: Secondary | ICD-10-CM | POA: Diagnosis present

## 2024-01-02 DIAGNOSIS — Z833 Family history of diabetes mellitus: Secondary | ICD-10-CM

## 2024-01-02 DIAGNOSIS — Z961 Presence of intraocular lens: Secondary | ICD-10-CM | POA: Diagnosis present

## 2024-01-02 DIAGNOSIS — G9341 Metabolic encephalopathy: Secondary | ICD-10-CM | POA: Diagnosis present

## 2024-01-02 DIAGNOSIS — Z87891 Personal history of nicotine dependence: Secondary | ICD-10-CM

## 2024-01-02 DIAGNOSIS — Z883 Allergy status to other anti-infective agents status: Secondary | ICD-10-CM

## 2024-01-02 DIAGNOSIS — Z9049 Acquired absence of other specified parts of digestive tract: Secondary | ICD-10-CM

## 2024-01-02 DIAGNOSIS — I10 Essential (primary) hypertension: Secondary | ICD-10-CM | POA: Diagnosis not present

## 2024-01-02 DIAGNOSIS — K7290 Hepatic failure, unspecified without coma: Secondary | ICD-10-CM | POA: Diagnosis not present

## 2024-01-02 DIAGNOSIS — K469 Unspecified abdominal hernia without obstruction or gangrene: Secondary | ICD-10-CM | POA: Diagnosis present

## 2024-01-02 DIAGNOSIS — E8722 Chronic metabolic acidosis: Secondary | ICD-10-CM | POA: Diagnosis present

## 2024-01-02 DIAGNOSIS — Z808 Family history of malignant neoplasm of other organs or systems: Secondary | ICD-10-CM

## 2024-01-02 LAB — URINALYSIS, ROUTINE W REFLEX MICROSCOPIC
Bilirubin Urine: NEGATIVE
Glucose, UA: NEGATIVE mg/dL
Hgb urine dipstick: NEGATIVE
Ketones, ur: NEGATIVE mg/dL
Nitrite: NEGATIVE
Protein, ur: NEGATIVE mg/dL
Specific Gravity, Urine: 1.019 (ref 1.005–1.030)
pH: 6 (ref 5.0–8.0)

## 2024-01-02 LAB — RESP PANEL BY RT-PCR (RSV, FLU A&B, COVID)  RVPGX2
Influenza A by PCR: NEGATIVE
Influenza B by PCR: NEGATIVE
Resp Syncytial Virus by PCR: NEGATIVE
SARS Coronavirus 2 by RT PCR: NEGATIVE

## 2024-01-02 LAB — CBC WITH DIFFERENTIAL/PLATELET
Abs Immature Granulocytes: 0.04 K/uL (ref 0.00–0.07)
Basophils Absolute: 0 K/uL (ref 0.0–0.1)
Basophils Relative: 0 %
Eosinophils Absolute: 0.5 K/uL (ref 0.0–0.5)
Eosinophils Relative: 6 %
HCT: 35.2 % — ABNORMAL LOW (ref 36.0–46.0)
Hemoglobin: 11.1 g/dL — ABNORMAL LOW (ref 12.0–15.0)
Immature Granulocytes: 1 %
Lymphocytes Relative: 16 %
Lymphs Abs: 1.2 K/uL (ref 0.7–4.0)
MCH: 31 pg (ref 26.0–34.0)
MCHC: 31.5 g/dL (ref 30.0–36.0)
MCV: 98.3 fL (ref 80.0–100.0)
Monocytes Absolute: 0.6 K/uL (ref 0.1–1.0)
Monocytes Relative: 8 %
Neutro Abs: 5.3 K/uL (ref 1.7–7.7)
Neutrophils Relative %: 69 %
Platelets: 118 K/uL — ABNORMAL LOW (ref 150–400)
RBC: 3.58 MIL/uL — ABNORMAL LOW (ref 3.87–5.11)
RDW: 14.6 % (ref 11.5–15.5)
WBC: 7.7 K/uL (ref 4.0–10.5)
nRBC: 0 % (ref 0.0–0.2)

## 2024-01-02 LAB — COMPREHENSIVE METABOLIC PANEL WITH GFR
ALT: 22 U/L (ref 0–44)
AST: 28 U/L (ref 15–41)
Albumin: 2.9 g/dL — ABNORMAL LOW (ref 3.5–5.0)
Alkaline Phosphatase: 116 U/L (ref 38–126)
Anion gap: 9 (ref 5–15)
BUN: 26 mg/dL — ABNORMAL HIGH (ref 8–23)
CO2: 16 mmol/L — ABNORMAL LOW (ref 22–32)
Calcium: 8.7 mg/dL — ABNORMAL LOW (ref 8.9–10.3)
Chloride: 114 mmol/L — ABNORMAL HIGH (ref 98–111)
Creatinine, Ser: 1.4 mg/dL — ABNORMAL HIGH (ref 0.44–1.00)
GFR, Estimated: 41 mL/min — ABNORMAL LOW
Glucose, Bld: 153 mg/dL — ABNORMAL HIGH (ref 70–99)
Potassium: 3.5 mmol/L (ref 3.5–5.1)
Sodium: 139 mmol/L (ref 135–145)
Total Bilirubin: 1.8 mg/dL — ABNORMAL HIGH (ref 0.0–1.2)
Total Protein: 5.5 g/dL — ABNORMAL LOW (ref 6.5–8.1)

## 2024-01-02 LAB — AMMONIA: Ammonia: 58 umol/L — ABNORMAL HIGH (ref 9–35)

## 2024-01-02 LAB — LIPASE, BLOOD: Lipase: 47 U/L (ref 11–51)

## 2024-01-02 MED ORDER — HEPARIN SODIUM (PORCINE) 5000 UNIT/ML IJ SOLN
5000.0000 [IU] | Freq: Three times a day (TID) | INTRAMUSCULAR | Status: DC
  Administered 2024-01-02 – 2024-01-06 (×12): 5000 [IU] via SUBCUTANEOUS
  Filled 2024-01-02 (×12): qty 1

## 2024-01-02 MED ORDER — PANTOPRAZOLE SODIUM 40 MG PO TBEC
40.0000 mg | DELAYED_RELEASE_TABLET | Freq: Two times a day (BID) | ORAL | Status: DC
  Administered 2024-01-02 – 2024-01-06 (×8): 40 mg via ORAL
  Filled 2024-01-02 (×8): qty 1

## 2024-01-02 MED ORDER — LACTULOSE 10 GM/15ML PO SOLN
30.0000 g | Freq: Three times a day (TID) | ORAL | Status: DC
  Administered 2024-01-02 – 2024-01-06 (×11): 30 g via ORAL
  Filled 2024-01-02 (×12): qty 45

## 2024-01-02 MED ORDER — ROPINIROLE HCL 0.25 MG PO TABS
0.2500 mg | ORAL_TABLET | Freq: Every day | ORAL | Status: DC
  Administered 2024-01-02 – 2024-01-05 (×4): 0.25 mg via ORAL
  Filled 2024-01-02 (×4): qty 1

## 2024-01-02 MED ORDER — FUROSEMIDE 40 MG PO TABS
40.0000 mg | ORAL_TABLET | Freq: Every day | ORAL | Status: DC
  Administered 2024-01-03: 40 mg via ORAL
  Filled 2024-01-02: qty 1

## 2024-01-02 MED ORDER — LEVOTHYROXINE SODIUM 50 MCG PO TABS
175.0000 ug | ORAL_TABLET | Freq: Every day | ORAL | Status: DC
  Administered 2024-01-03 – 2024-01-06 (×4): 175 ug via ORAL
  Filled 2024-01-02 (×4): qty 1

## 2024-01-02 MED ORDER — SPIRONOLACTONE 25 MG PO TABS
50.0000 mg | ORAL_TABLET | Freq: Every day | ORAL | Status: DC
  Administered 2024-01-02 – 2024-01-06 (×5): 50 mg via ORAL
  Filled 2024-01-02 (×5): qty 2

## 2024-01-02 MED ORDER — MIDODRINE HCL 5 MG PO TABS
10.0000 mg | ORAL_TABLET | Freq: Three times a day (TID) | ORAL | Status: DC
  Administered 2024-01-02 – 2024-01-06 (×8): 10 mg via ORAL
  Filled 2024-01-02 (×10): qty 2

## 2024-01-02 MED ORDER — METHOCARBAMOL 500 MG PO TABS
500.0000 mg | ORAL_TABLET | Freq: Once | ORAL | Status: AC | PRN
  Administered 2024-01-02: 500 mg via ORAL
  Filled 2024-01-02: qty 1

## 2024-01-02 MED ORDER — SODIUM BICARBONATE 650 MG PO TABS
650.0000 mg | ORAL_TABLET | Freq: Two times a day (BID) | ORAL | Status: DC
  Administered 2024-01-02 – 2024-01-06 (×8): 650 mg via ORAL
  Filled 2024-01-02 (×8): qty 1

## 2024-01-02 MED ORDER — FUROSEMIDE 10 MG/ML IJ SOLN
20.0000 mg | Freq: Once | INTRAMUSCULAR | Status: AC
  Administered 2024-01-02: 20 mg via INTRAVENOUS
  Filled 2024-01-02: qty 2

## 2024-01-02 MED ORDER — OXYCODONE HCL 5 MG PO TABS
2.5000 mg | ORAL_TABLET | ORAL | Status: DC | PRN
  Administered 2024-01-02 – 2024-01-05 (×6): 2.5 mg via ORAL
  Filled 2024-01-02 (×6): qty 1

## 2024-01-02 MED ORDER — METOCLOPRAMIDE HCL 5 MG/ML IJ SOLN: 10.0000 mg | Freq: Four times a day (QID) | INTRAMUSCULAR | Status: DC | PRN

## 2024-01-02 MED ORDER — RIFAXIMIN 550 MG PO TABS
550.0000 mg | ORAL_TABLET | Freq: Two times a day (BID) | ORAL | Status: DC
  Administered 2024-01-02 – 2024-01-06 (×8): 550 mg via ORAL
  Filled 2024-01-02 (×8): qty 1

## 2024-01-02 MED ORDER — ALBUTEROL SULFATE (2.5 MG/3ML) 0.083% IN NEBU: 2.5000 mg | INHALATION_SOLUTION | RESPIRATORY_TRACT | Status: DC | PRN

## 2024-01-02 MED ORDER — DICYCLOMINE HCL 10 MG PO CAPS
10.0000 mg | ORAL_CAPSULE | Freq: Three times a day (TID) | ORAL | Status: DC | PRN
  Administered 2024-01-05: 10 mg via ORAL
  Filled 2024-01-02: qty 1

## 2024-01-02 NOTE — ED Triage Notes (Signed)
 Pts son brought pt in due to confusion starting this morning about 0200. He states she gets like this when her ammonia is high and/or a UTI. She been 11 days ago for UTI.

## 2024-01-02 NOTE — ED Provider Notes (Signed)
 " [place] EMERGENCY DEPARTMENT AT [facility] Provider Note   CSN: [id number] Arrival date & time: 01/02/24  1206     Patient presents with: No chief complaint on file.   [name] is a 68 y.o. female.   68 year old female presenting emergency department with confusion.  History of hepatic encephalopathy on lactulose .  Seem to have worsening confusion since last night.  Seems consistent with her prior hepatic encephalopathy.  Recently in the hospital diagnosed with UTI.  She is alert and oriented x 3, but does seem somewhat confused.  Denies any pain.  Endorses cough.  Family ember bedside notes that she is more confused than she was when she is discharged from the hospital.  Decreased bowel movements        Prior to Admission medications  Medication Sig Start Date End Date Taking? Authorizing Provider  acetaminophen  (TYLENOL ) 500 MG tablet Take 500-1,000 mg by mouth every 6 (six) hours as needed for headache or fever (pain).    [provider]  albuterol  (VENTOLIN  HFA) 108 (90 Base) MCG/ACT inhaler Inhale 2 puffs into the lungs every 6 (six) hours as needed for wheezing or shortness of breath. 11/13/23   [name], MD  atorvastatin  (LIPITOR) 40 MG tablet TAKE 1 TABLET(40 MG) BY MOUTH IN THE MORNING Patient taking differently: Take 40 mg by mouth in the morning. 09/18/23   [name], MD  colchicine  0.6 MG tablet Take 0.6 mg by mouth 2 (two) times daily as needed (as directed for gout flares).    [provider]  copper  tablet Take 2 mg by mouth in the morning and at bedtime.    [provider]  cyanocobalamin  (VITAMIN B12) 1000 MCG tablet Take 2,000 mcg by mouth every Monday, Wednesday, and Friday.    [provider]  dicyclomine  (BENTYL ) 10 MG capsule Take 1 capsule (10 mg total) by mouth 3 (three) times daily as needed for spasms (abdominal cramping). 08/07/23   [name], MD  EPINEPHrine  0.3 mg/0.3 mL IJ SOAJ  injection Inject 0.3 mg into the muscle as needed for anaphylaxis. 07/27/23   [name], MD  famotidine  (PEPCID ) 20 MG tablet Take 20 mg by mouth daily as needed for heartburn or indigestion.    [provider]  furosemide  (LASIX ) 40 MG tablet Take 1 tablet (40 mg total) by mouth daily. 12/18/23   Adhikari, Amrit, MD  HYDROcodone -acetaminophen  (NORCO) 10-325 MG tablet Take 1 tablet by mouth 3 (three) times daily as needed (for pain- may take up to a sum total of 3 tablets/24 hours). 06/15/23   [provider]  IRON -VITAMIN C  PO Take 1 tablet by mouth daily.    [provider]  lactulose  (CHRONULAC ) 10 GM/15ML solution Take 45 mLs (30 g total) by mouth 3 (three) times daily. 12/28/23   Barbarann Nest, MD  levothyroxine  (SYNTHROID ) 175 MCG tablet Take 1 tablet (175 mcg total) by mouth daily before breakfast. 12/21/23   [name], MD  magnesium  gluconate (MAGONATE) 500 (27 Mg) MG TABS tablet Take 1 tablet (500 mg total) by mouth in the morning and at bedtime. Patient taking differently: Take 1,000 mg by mouth in the morning. 05/01/23   [name], MD  meclizine  (ANTIVERT ) 25 MG tablet Take 1 tablet (25 mg total) by mouth 3 (three) times daily as needed for dizziness. 12/18/23   Jillian Buttery, MD  megestrol  (MEGACE ) 40 MG tablet Take 1 tablet (40 mg total) by mouth 2 (two) times daily. 05/04/23  Ajewole, [name], MD  methocarbamol  (ROBAXIN ) 500 MG tablet Take 500 mg by mouth in the morning.    [provider]  midodrine  (PROAMATINE ) 10 MG tablet Take 10 mg by mouth 3 (three) times daily.    [provider]  Multiple Vitamins-Minerals (CENTRUM SILVER  ULTRA WOMENS PO) Take 1 tablet by mouth daily with breakfast.    [provider]  naloxone (NARCAN) nasal spray 4 mg/0.1 mL Place 1 spray into the nose as needed (accidental overdose). 03/25/22   [provider]  nystatin  cream (MYCOSTATIN ) Apply 1 Application topically 2 (two) times  daily as needed for dry skin. Patient taking differently: Apply 1 Application topically 2 (two) times daily as needed (for irritation). 05/29/23   Cheryle Page, MD  pantoprazole  (PROTONIX ) 40 MG tablet TAKE 1 TABLET(40 MG) BY MOUTH TWICE DAILY BEFORE A MEAL 12/21/23   [name], MD  potassium chloride  (KLOR-CON ) 10 MEQ tablet Take 2 tablets (20 mEq total) by mouth 2 (two) times daily. 08/07/23   [name], MD  rifaximin  (XIFAXAN ) 550 MG TABS tablet Take 550 mg by mouth in the morning and at bedtime.    [provider]  rOPINIRole  (REQUIP ) 0.25 MG tablet TAKE 1 TABLET(0.25 MG) BY MOUTH AT BEDTIME 06/10/23   [name], MD  senna-docusate (SENOKOT-S) 8.6-50 MG tablet Take 1 tablet by mouth 2 (two) times daily. 11/30/23   Duanne Butler DASEN, MD  sodium bicarbonate  650 MG tablet Take 1 tablet (650 mg total) by mouth 2 (two) times daily. 12/28/23   Barbarann Nest, MD  spironolactone  (ALDACTONE ) 50 MG tablet Take 1 tablet (50 mg total) by mouth daily. 11/13/23   [name], MD  triamcinolone  cream (KENALOG ) 0.1 % Apply 1 Application topically 2 (two) times daily as needed (skin irritation).    [provider]  venlafaxine  XR (EFFEXOR -XR) 75 MG 24 hr capsule Take 1 capsule (75 mg total) by mouth daily with breakfast. 12/13/23   Duanne Butler DASEN, MD    Allergies: Ace inhibitors, Gadolinium derivatives, Ivp dye [iodinated contrast media], Wound dressing adhesive, Desitin [zinc  oxide], Nsaids, Butrans [buprenorphine], Yellow jacket venom, Hibiclens  [chlorhexidine  gluconate], Penicillins, Vibramycin [doxycycline], and Zofran  [ondansetron ]    Review of Systems  Updated Vital Signs BP 130/84 (BP Location: Left Arm)   Pulse 82   Temp 98 F (36.7 C) (Oral)   Resp 16   Ht 5' 2 (1.575 m)   Wt 95 kg   SpO2 100%   BMI 38.31 kg/m   Physical Exam Vitals and nursing note reviewed.  Constitutional:      General: She is not in acute distress.    Appearance: She is not  toxic-appearing.  HENT:     Head: Normocephalic.     Nose: Nose normal.  Eyes:     Conjunctiva/sclera: Conjunctivae normal.  Cardiovascular:     Rate and Rhythm: Regular rhythm.  Pulmonary:     Effort: Pulmonary effort is normal.     Breath sounds: Normal breath sounds.  Abdominal:     General: Abdomen is flat. There is no distension.     Tenderness: There is no abdominal tenderness. There is no guarding or rebound.  Musculoskeletal:     Right lower leg: Edema present.     Left lower leg: Edema present.  Skin:    General: Skin is warm.     Capillary Refill: Capillary refill takes less than 2 seconds.  Neurological:     General: No focal deficit present.  Mental Status: She is alert.  Psychiatric:        Mood and Affect: Mood normal.        Behavior: Behavior normal.    (all labs ordered are listed, but only abnormal results are displayed) Labs Reviewed  CBC WITH DIFFERENTIAL/PLATELET - Abnormal; Notable for the following components:      Result Value   RBC 3.58 (*)    Hemoglobin 11.1 (*)    HCT 35.2 (*)    Platelets 118 (*)    All other components within normal limits  COMPREHENSIVE METABOLIC PANEL WITH GFR - Abnormal; Notable for the following components:   Chloride 114 (*)    CO2 16 (*)    Glucose, Bld 153 (*)    BUN 26 (*)    Creatinine, Ser 1.40 (*)    Calcium  8.7 (*)    Total Protein 5.5 (*)    Albumin  2.9 (*)    Total Bilirubin 1.8 (*)    GFR, Estimated 41 (*)    All other components within normal limits  AMMONIA - Abnormal; Notable for the following components:   Ammonia 58 (*)    All other components within normal limits  RESP PANEL BY RT-PCR (RSV, FLU A&B, COVID)  RVPGX2  LIPASE, BLOOD  URINALYSIS, ROUTINE W REFLEX MICROSCOPIC    EKG: EKG Interpretation Date/Time:  Tuesday January 02 2024 14:01:16 EST Ventricular Rate:  80 PR Interval:  209 QRS Duration:  97 QT Interval:  379 QTC Calculation: 438 R Axis:   19  Text Interpretation: Sinus  rhythm Low voltage, precordial leads Confirmed by Neysa Clap [phone]) on 01/02/2024 2:46:57 PM  Radiology: No results found.   Procedures   Medications Ordered in the ED - No data to display  Clinical Course as of 01/02/24 1504  Tue Jan 02, 2024  1245 Discharged on 12/18 for hepatic encephalopathy.  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  [TY]  1434 Echo 10/31/23: 1. Left ventricular ejection fraction, by estimation, is 55 to 60%. The  left ventricle has normal function. The left ventricle has no regional  wall motion abnormalities. Left ventricular diastolic parameters were  normal.   2. Right ventricular systolic function is normal. The right ventricular  size is moderately enlarged. Tricuspid regurgitation signal is inadequate  for assessing PA pressure.   3. Left atrial size was moderately dilated.   4. The mitral valve is degenerative. Trivial mitral valve regurgitation.  No evidence of mitral stenosis. The mean mitral valve gradient is 4.0 mmHg  with average heart rate of 88 bpm. Moderate mitral annular calcification.   5. The aortic valve is tricuspid. There is moderate calcification of the  aortic valve. There is moderate thickening of the aortic valve. Aortic  valve regurgitation is not visualized. Moderate aortic valve stenosis.   6. There is borderline dilatation of the ascending aorta, measuring 39  mm.   7. The inferior vena cava is  dilated in size with <50% respiratory  variability, suggesting right atrial pressure of  15 mmHg.  [TY]  1446 Ammonia(!): 58 [TY]  1446 Lipase: 47 [TY]  1446 CBC with Differential(!) No leukocytosis.  Stable anemia compared to prior. [TY]  1446 DG Chest 2 View Do not appreciate obvious pneumonia or pneumothorax on my independent review. [TY]  1503 Resp panel by RT-PCR (RSV, Flu A&B, Covid) Anterior Nasal Swab /COVID/RSV negative. [TY]  1503 Continues to be confused per son at bedside.  Does have an elevated ammonia as well as mild AKI.  Will admit for hepatic encephalopathy. [TY]    Clinical Course User Index [TY] Neysa Caron PARAS, DO                                 Medical Decision Making 68 year old female presenting emergency department for confusion, afebrile nontachycardic, normotensive.  Maintaining oxygen saturation on room air.  She is alert and oriented, but is slow to respond and seems somewhat confused no localizing neurodeficits.  Per chart review recent admission for the same.  Was treated for UTI.  Considered CT head, however family notes similar presentation to prior encephalopathic episodes.  No localizing deficits on exam either.  Screening labs ordered.  See ED course for further MDM and disposition.  Amount and/or Complexity of Data Reviewed Independent Historian:     Details: Son notes decreased bowel movements and she has had worsening lower extremity edema since they decreased her Lasix  dose last hospitalization. External Data Reviewed:     Details: See ED course Labs: ordered. Decision-making details documented in ED Course. Radiology: ordered. Decision-making details documented in ED Course.    Details: Considered CT head, but has had 2 negative CT head's this past month alone. ECG/medicine tests: ordered.  Risk Decision regarding hospitalization. Diagnosis or treatment significantly limited by social determinants of health. Risk Details: Poor health  literacy       Final diagnoses:  Hepatic encephalopathy (HCC)  AKI (acute kidney injury)    ED Discharge Orders     None          Neysa Caron PARAS, DO 01/02/24 1504  "

## 2024-01-02 NOTE — H&P (Signed)
 " History and Physical  [name] FMW:[id number] DOB: 1955-02-21 DOA: 01/02/2024  PCP: [name], MD   Chief Complaint: Confusion  HPI: [name] is a 68 y.o. female with medical history significant for [name] and associated hepatic encephalopathy, depression, short gut syndrome with chronic diarrhea, stage IIIa CKD with anemia of chronic disease, hypothyroidism, chronic hypotension recent hospitalization until 12/18 with recurrent hepatic encephalopathy in the setting of UTI who now returns with recurrent confusion.  History is provided by her son, with whom she lives and who cares for her closely.  States that she initially was doing well when she went home, seemed to be eating well and approaching back to her baseline.  She completed a couple of days of cefadroxil  which was for her E. coli UTI.  She has a chronic cough, she has continued to be compliant with her rifaximin  and lactulose , having several bowel movements a day.  Review of Systems: Please see HPI for pertinent positives and negatives. A complete 10 system review of systems are otherwise negative.  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   Hyperammonemia 09/30/2022   Hypertension    Kidney stone    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   Obesity    Pulmonary emboli (HCC) 01/10/2009   Pulmonary embolism (HCC)    Short gut syndrome    Thyroid  disease    Past Surgical History:   Procedure Laterality Date   ABDOMINAL WALL MESH  REMOVAL     2016   CESAREAN SECTION WITH BILATERAL TUBAL LIGATION  04/30/1985   CHOLECYSTECTOMY  05/2007   Exploratory laparotomy, lysis of adhesions, takedown of ileostomy with small bowel resection, open cholecystectomy and ileocolostomy   COLON SURGERY  01/2007   exploratory laparotomy with right colecotmy and end ileostomy   COLONOSCOPY  04/02/2019   Dr Gunnar Chris Daniels, MD [facility]   ESOPHAGOGASTRODUODENOSCOPY (EGD) WITH PROPOFOL  N/A 10/19/2022   Procedure: ESOPHAGOGASTRODUODENOSCOPY (EGD) WITH PROPOFOL ;  Surgeon: Saintclair Jasper, MD;  Location: [facility];  Service: Gastroenterology;  Laterality: N/A;   ESOPHAGOSCOPY  04/02/2019   EYE SURGERY Left 10/06/2020   Cataract extraciton w/intraocular lens implant- Dr Caresse   HERNIA REPAIR  10/2007   ILEOSTOMY  2009   states bowel perfotation wih colonsocopy   ILEOSTOMY CLOSURE  2009   INCISIONAL HERNIA REPAIR  10/11/2018   IR KYPHO THORACIC WITH BONE BIOPSY  08/31/2022   IR KYPHO THORACIC WITH BONE BIOPSY  10/27/2022   IR RADIOLOGIST EVAL & MGMT  09/27/2022   KIDNEY STONE SURGERY     LIVER RESECTION     2009   PANNICULECTOMY     repair of abdominal wall hernia   WRIST SURGERY     tendonitis   Social History:  reports that she quit smoking about 39 years ago. Her smoking use included cigarettes. She has been exposed to tobacco smoke. She has never used smokeless tobacco. She reports that she does not drink  alcohol and does not use drugs.  Allergies[1]  Family History  Problem Relation Age of Onset   Uterine cancer Mother    Bone cancer Father    Heart disease Father    Hyperlipidemia Father    Dementia Father    Hypertension Father    Skin cancer Sister    Hypertension Sister    Diabetes Sister    Hyperlipidemia Brother    Hypertension Brother    Heart disease Brother      Prior to Admission medications  Medication Sig Start Date End Date Taking? Authorizing Provider   acetaminophen  (TYLENOL ) 500 MG tablet Take 500-1,000 mg by mouth every 6 (six) hours as needed for headache or fever (pain).    [provider]  albuterol  (VENTOLIN  HFA) 108 (90 Base) MCG/ACT inhaler Inhale 2 puffs into the lungs every 6 (six) hours as needed for wheezing or shortness of breath. 11/13/23   [name], MD  atorvastatin  (LIPITOR) 40 MG tablet TAKE 1 TABLET(40 MG) BY MOUTH IN THE MORNING Patient taking differently: Take 40 mg by mouth in the morning. 09/18/23   [name], MD  colchicine  0.6 MG tablet Take 0.6 mg by mouth 2 (two) times daily as needed (as directed for gout flares).    [provider]  copper  tablet Take 2 mg by mouth in the morning and at bedtime.    [provider]  cyanocobalamin  (VITAMIN B12) 1000 MCG tablet Take 2,000 mcg by mouth every Monday, Wednesday, and Friday.    [provider]  dicyclomine  (BENTYL ) 10 MG capsule Take 1 capsule (10 mg total) by mouth 3 (three) times daily as needed for spasms (abdominal cramping). 08/07/23   [name], MD  EPINEPHrine  0.3 mg/0.3 mL IJ SOAJ injection Inject 0.3 mg into the muscle as needed for anaphylaxis. 07/27/23   [name], MD  famotidine  (PEPCID ) 20 MG tablet Take 20 mg by mouth daily as needed for heartburn or indigestion.    [provider]  furosemide  (LASIX ) 40 MG tablet Take 1 tablet (40 mg total) by mouth daily. 12/18/23   Adhikari, Amrit, MD  HYDROcodone -acetaminophen  (NORCO) 10-325 MG tablet Take 1 tablet by mouth 3 (three) times daily as needed (for pain- may take up to a sum total of 3 tablets/24 hours). 06/15/23   [provider]  IRON -VITAMIN C  PO Take 1 tablet by mouth daily.    [provider]  lactulose  (CHRONULAC ) 10 GM/15ML solution Take 45 mLs (30 g total) by mouth 3 (three) times daily. 12/28/23   Barbarann Nest, MD  levothyroxine  (SYNTHROID ) 175 MCG tablet Take 1 tablet (175 mcg total) by mouth daily before breakfast.  12/21/23   [name], MD  magnesium  gluconate (MAGONATE) 500 (27 Mg) MG TABS tablet Take 1 tablet (500 mg total) by mouth in the morning and at bedtime. Patient taking differently: Take 1,000 mg by mouth in the morning. 05/01/23   [name], MD  meclizine  (ANTIVERT ) 25 MG tablet Take 1 tablet (25 mg total) by mouth 3 (three) times daily as needed for dizziness. 12/18/23   Jillian Buttery, MD  megestrol  (MEGACE ) 40 MG tablet Take 1 tablet (40 mg total) by mouth 2 (two) times daily. 05/04/23   Ajewole, [name], MD  methocarbamol  (ROBAXIN ) 500 MG tablet Take 500 mg by mouth in the morning.    [provider]  midodrine  (PROAMATINE ) 10 MG tablet Take 10 mg by mouth 3 (three) times daily.    [provider]  Multiple Vitamins-Minerals (  CENTRUM SILVER  ULTRA WOMENS PO) Take 1 tablet by mouth daily with breakfast.    [provider]  naloxone (NARCAN) nasal spray 4 mg/0.1 mL Place 1 spray into the nose as needed (accidental overdose). 03/25/22   [provider]  nystatin  cream (MYCOSTATIN ) Apply 1 Application topically 2 (two) times daily as needed for dry skin. Patient taking differently: Apply 1 Application topically 2 (two) times daily as needed (for irritation). 05/29/23   Cheryle Page, MD  pantoprazole  (PROTONIX ) 40 MG tablet TAKE 1 TABLET(40 MG) BY MOUTH TWICE DAILY BEFORE A MEAL 12/21/23   [name], MD  potassium chloride  (KLOR-CON ) 10 MEQ tablet Take 2 tablets (20 mEq total) by mouth 2 (two) times daily. 08/07/23   [name], MD  rifaximin  (XIFAXAN ) 550 MG TABS tablet Take 550 mg by mouth in the morning and at bedtime.    [provider]  rOPINIRole  (REQUIP ) 0.25 MG tablet TAKE 1 TABLET(0.25 MG) BY MOUTH AT BEDTIME 06/10/23   [name], MD  senna-docusate (SENOKOT-S) 8.6-50 MG tablet Take 1 tablet by mouth 2 (two) times daily. 11/30/23   Duanne Butler DASEN, MD  sodium bicarbonate  650 MG tablet Take 1 tablet (650 mg total) by  mouth 2 (two) times daily. 12/28/23   Barbarann Nest, MD  spironolactone  (ALDACTONE ) 50 MG tablet Take 1 tablet (50 mg total) by mouth daily. 11/13/23   [name], MD  triamcinolone  cream (KENALOG ) 0.1 % Apply 1 Application topically 2 (two) times daily as needed (skin irritation).    [provider]  venlafaxine  XR (EFFEXOR -XR) 75 MG 24 hr capsule Take 1 capsule (75 mg total) by mouth daily with breakfast. 12/13/23   Duanne Butler DASEN, MD    Physical Exam: BP 130/84 (BP Location: Left Arm)   Pulse 82   Temp 98 F (36.7 C) (Oral)   Resp 16   Ht 5' 2 (1.575 m)   Wt 95 kg   SpO2 100%   BMI 38.31 kg/m  General:  Alert, orientedx3 but somewhat slow to respond, calm, in no acute distress  Eyes: EOMI, clear conjuctivae, white sclerea, chronic left visual impairment Cardiovascular: RRR, no murmurs or rubs, she has 1+ pitting bilateral lower extremity edema  Respiratory: clear to auscultation bilaterally, no wheezes, no crackles  Abdomen: soft, nontender, nondistended Skin: dry, no rashes  Musculoskeletal: no joint effusions, normal range of motion  Psychiatric: appropriate affect, normal speech  Neurologic: extraocular muscles intact, clear speech, moving all extremities with intact sensorium         Labs on Admission:  Basic Metabolic Panel: Recent Labs  Lab 12/28/23 0715 01/02/24 1332  NA 138 139  K 4.2 3.5  CL 113* 114*  CO2 17* 16*  GLUCOSE 95 153*  BUN 18 26*  CREATININE 0.99 1.40*  CALCIUM  8.3* 8.7*   Liver Function Tests: Recent Labs  Lab 01/02/24 1332  AST 28  ALT 22  ALKPHOS 116  BILITOT 1.8*  PROT 5.5*  ALBUMIN  2.9*   Recent Labs  Lab 01/02/24 1332  LIPASE 47   Recent Labs  Lab 12/27/23 0716 12/28/23 0832 01/02/24 1332  AMMONIA 117* 106* 58*   CBC: Recent Labs  Lab 12/28/23 0715 01/02/24 1332  WBC 6.6 7.7  NEUTROABS  --  5.3  HGB 10.7* 11.1*  HCT 32.3* 35.2*  MCV 97.3 98.3  PLT 113* 118*   Cardiac Enzymes: No results for  input(s): CKTOTAL, CKMB, CKMBINDEX, TROPONINI in the last 168 hours. BNP (last 3 results) Recent Labs  01/03/23 0616 08/24/23 1544  BNP 469.4* 72.9    ProBNP (last 3 results) No results for input(s): PROBNP in the last 8760 hours.  CBG: No results for input(s): GLUCAP in the last 168 hours.  Radiological Exams on Admission: No results found.  Assessment/Plan [name] is a 68 y.o. female with medical history significant for [name] and associated hepatic encephalopathy, depression, short gut syndrome with chronic diarrhea, stage IIIa CKD with anemia of chronic disease, hypothyroidism, chronic hypotension recent hospitalization until 12/18 with recurrent hepatic encephalopathy in the setting of UTI who now returns with recurrent confusion.   Encephalopathy, history of hepatic encephalopathy, NASH cirrhosis-possible hepatic encephalopathy, does not seem to be any signs of acute infection.  Ammonia is actually improved from previous.  CTS was considered, but she had 2 already this month which were unremarkable, she is presenting like her usual hepatic encephalopathy, and has no focal deficit on my exam. -Observation admission -Continue rifaximin  and lactulose  as well as Lasix  and Aldactone  -Follow-up chest x-ray, though no obvious consolidation on my personal review  CKD stage IIIa-baseline creatinine is 1.0, currently slightly elevated at 1.4.  Due to slight creatinine elevation, not meeting criteria for AKI, she was discharged on a lower dose of home Lasix . -Due to peripheral edema, will give IV Lasix  20 mg x 1 now -Resume home dose Lasix  40 mg p.o. daily from the morning  Chronic metabolic acidosis-due to chronic diarrhea from short gut syndrome -Continue bicarb  Hypothyroidism-Synthroid   Chronic hypotension-continue midodrine , with holding parameters  GERD-Protonix  daily  Hyperlipidemia-Lipitor  Depression-venlafaxine   Chronic back pain-will  continue low-dose opiate as needed  DVT prophylaxis: SQ heparin     Code Status: Do not attempt resuscitation (DNR) PRE-ARREST INTERVENTIONS DESIRED  Consults called: None  Admission status: Observation  Time spent: 49 minutes  Maurica Omura CHRISTELLA Gail MD [facility] Pager [phone]  If 7PM-7AM, please contact night-coverage [email or web address] Password [email or web address]  01/02/2024, 3:50 PM      [1]  Allergies Allergen Reactions   Ace Inhibitors Anaphylaxis and Swelling   Gadolinium Derivatives Anaphylaxis   Ivp Dye [Iodinated Contrast Media] Anaphylaxis   Wound Dressing Adhesive Dermatitis   Desitin [Zinc  Oxide] Rash and Other (See Comments)    Worsens rash   Nsaids Other (See Comments)    Contraindication due to CKD    Butrans [Buprenorphine] Other (See Comments)    Discontinued due to liver function   Yellow Jacket Venom Swelling   Hibiclens  [Chlorhexidine  Gluconate] Rash and Other (See Comments)    Worsens rash   Penicillins Dermatitis and Rash   Vibramycin [Doxycycline] Rash   Zofran  [Ondansetron ] Rash   "

## 2024-01-02 NOTE — ED Notes (Signed)
 Attempted IV without success.

## 2024-01-03 DIAGNOSIS — K7469 Other cirrhosis of liver: Secondary | ICD-10-CM | POA: Diagnosis not present

## 2024-01-03 DIAGNOSIS — G9341 Metabolic encephalopathy: Secondary | ICD-10-CM | POA: Diagnosis not present

## 2024-01-03 DIAGNOSIS — K7682 Hepatic encephalopathy: Secondary | ICD-10-CM | POA: Diagnosis not present

## 2024-01-03 DIAGNOSIS — K7581 Nonalcoholic steatohepatitis (NASH): Secondary | ICD-10-CM | POA: Diagnosis not present

## 2024-01-03 DIAGNOSIS — R5381 Other malaise: Secondary | ICD-10-CM | POA: Diagnosis not present

## 2024-01-03 LAB — COMPREHENSIVE METABOLIC PANEL WITH GFR
ALT: 18 U/L (ref 0–44)
AST: 30 U/L (ref 15–41)
Albumin: 2.7 g/dL — ABNORMAL LOW (ref 3.5–5.0)
Alkaline Phosphatase: 116 U/L (ref 38–126)
Anion gap: 11 (ref 5–15)
BUN: 23 mg/dL (ref 8–23)
CO2: 17 mmol/L — ABNORMAL LOW (ref 22–32)
Calcium: 8.5 mg/dL — ABNORMAL LOW (ref 8.9–10.3)
Chloride: 114 mmol/L — ABNORMAL HIGH (ref 98–111)
Creatinine, Ser: 1.11 mg/dL — ABNORMAL HIGH (ref 0.44–1.00)
GFR, Estimated: 55 mL/min — ABNORMAL LOW
Glucose, Bld: 85 mg/dL (ref 70–99)
Potassium: 3.6 mmol/L (ref 3.5–5.1)
Sodium: 141 mmol/L (ref 135–145)
Total Bilirubin: 1.8 mg/dL — ABNORMAL HIGH (ref 0.0–1.2)
Total Protein: 5 g/dL — ABNORMAL LOW (ref 6.5–8.1)

## 2024-01-03 LAB — BLOOD GAS, VENOUS
Acid-base deficit: 4.7 mmol/L — ABNORMAL HIGH (ref 0.0–2.0)
Bicarbonate: 19.5 mmol/L — ABNORMAL LOW (ref 20.0–28.0)
O2 Saturation: 96.1 %
Patient temperature: 36.8
pCO2, Ven: 33 mmHg — ABNORMAL LOW (ref 44–60)
pH, Ven: 7.38 (ref 7.25–7.43)
pO2, Ven: 67 mmHg — ABNORMAL HIGH (ref 32–45)

## 2024-01-03 LAB — CBC
HCT: 33.7 % — ABNORMAL LOW (ref 36.0–46.0)
Hemoglobin: 10.7 g/dL — ABNORMAL LOW (ref 12.0–15.0)
MCH: 30.8 pg (ref 26.0–34.0)
MCHC: 31.8 g/dL (ref 30.0–36.0)
MCV: 97.1 fL (ref 80.0–100.0)
Platelets: 106 K/uL — ABNORMAL LOW (ref 150–400)
RBC: 3.47 MIL/uL — ABNORMAL LOW (ref 3.87–5.11)
RDW: 14.7 % (ref 11.5–15.5)
WBC: 7.2 K/uL (ref 4.0–10.5)
nRBC: 0 % (ref 0.0–0.2)

## 2024-01-03 MED ORDER — MELATONIN 3 MG PO TABS
3.0000 mg | ORAL_TABLET | Freq: Once | ORAL | Status: AC
  Administered 2024-01-03: 3 mg via ORAL
  Filled 2024-01-03: qty 1

## 2024-01-03 MED ORDER — FUROSEMIDE 20 MG PO TABS
20.0000 mg | ORAL_TABLET | Freq: Every day | ORAL | Status: DC
  Administered 2024-01-04 – 2024-01-06 (×3): 20 mg via ORAL
  Filled 2024-01-03 (×3): qty 1

## 2024-01-03 NOTE — Progress Notes (Addendum)
 " Progress Note    [name]   FMW:[id number]  DOB: [birth year]  DOA: 01/02/2024     0 PCP: [name], MD  Initial CC: Altered mentation  Hospital Course: [name] is a 68 y.o. female with medical history significant for [name] and associated hepatic encephalopathy, depression, short gut syndrome with chronic diarrhea, stage IIIa CKD with anemia of chronic disease, hypothyroidism, chronic hypotension recent hospitalization until 12/18 with recurrent hepatic encephalopathy in the setting of UTI who now returns with recurrent confusion.   History is provided by her son, with whom she lives and who cares for her closely.  States that she initially was doing well when she went home, seemed to be eating well and approaching back to her baseline.  She completed a couple of days of cefadroxil  which was for her E. coli UTI.  She has a chronic cough, she has continued to be compliant with her rifaximin  and lactulose , having several bowel movements a day.    Assessment/Plan   Acute metabolic hepatic encephalopathy  - unclear etiology on admission; NH3 was lower than prior and no major concern for infection. Possible that renal function contributed but BUN still not high either - may need lasix  dose reduction - checking VBG today to rule out hypercarbia - may need repeat sleep study too at home at some point; SpO2 is 100% on RA (checked personally) - continue monitoring    CKD stage IIIa - patient has history of CKD3a. Baseline creat ~ 1.2, eGFR~ 54-56 - creat not meeting AKI definition, but did have higher BUN and ratio elevated possibly from overdiuresis and/or depletion in general - continue diet - will decrease lasix  dose  Chronic metabolic acidosis -due to chronic diarrhea from short gut syndrome -Continue bicarb   Hypothyroidism-Synthroid    Chronic hypotension-continue midodrine , with holding parameters   GERD-Protonix  daily   Hyperlipidemia-Lipitor    Depression-venlafaxine    Chronic back pain-will continue low-dose opiate as needed  Interval History:  Resting comfortably in bed this morning but excessively sleepy.  She does arouse to engage in conversation but falls back asleep.  Son is present bedside and states this is abnormal from her usual baseline.  Typically she is much more awake and alert.  Appears that her confusion at least has improved compared to admission.  Antimicrobials:   DVT prophylaxis:  heparin  injection 5,000 Units Start: 01/02/24 2200   Code Status:   Code Status: Do not attempt resuscitation (DNR) PRE-ARREST INTERVENTIONS DESIRED  Mobility Assessment (Last 72 Hours)     Mobility Assessment     Row Name 01/03/24 0827 01/03/24 0129 01/02/24 2016 01/02/24 1656     Does the patient have exclusion criteria? No- Perform mobility assessment No- Perform mobility assessment No- Perform mobility assessment No- Perform mobility assessment    What is the highest level of mobility based on the mobility assessment? Level 4 (Ambulates with assistance) - Balance while stepping forward/back - Complete Level 3 (Stands with assistance) - Balance while standing  and cannot march in place Level 3 (Stands with assistance) - Balance while standing  and cannot march in place Level 3 (Stands with assistance) - Balance while standing  and cannot march in place    Is the above level different from baseline mobility prior to current illness? Yes - Recommend PT order Yes - Recommend PT order Yes - Recommend PT order Yes - Recommend PT order       Diet: Diet Orders (From admission, onward)  Start     Ordered   01/02/24 1536  Diet regular Room service appropriate? Yes; Fluid consistency: Thin  Diet effective now       Question Answer Comment  Room service appropriate? Yes   Fluid consistency: Thin      01/02/24 1536            Barriers to discharge: None Disposition Plan: Home HH orders placed: N/A Status is:  Observation  Objective: Blood pressure 124/62, pulse 79, temperature 98.9 F (37.2 C), temperature source Oral, resp. rate 18, height 5' 2 (1.575 m), weight 95 kg, SpO2 97%.  Examination:  Physical Exam Constitutional:      Comments: Lethargic and somnolent but awakens easily but falls back asleep.  Confusion seems improved and she carries on conversation but still becomes excessively sleepy  HENT:     Head: Normocephalic and atraumatic.     Mouth/Throat:     Mouth: Mucous membranes are moist.  Eyes:     Comments: Chronic opacified left eye noted  Cardiovascular:     Rate and Rhythm: Normal rate and regular rhythm.  Pulmonary:     Effort: Pulmonary effort is normal. No respiratory distress.     Breath sounds: Normal breath sounds. No wheezing.  Abdominal:     General: Bowel sounds are normal. There is no distension.     Palpations: Abdomen is soft.     Tenderness: There is generalized abdominal tenderness.     Comments: Hernia appreciated.  Generalized associated tenderness  Musculoskeletal:     Cervical back: Normal range of motion and neck supple.     Comments: Bilateral lower extremity edema, approximately 2-3+  Skin:    General: Skin is warm and dry.  Neurological:     Comments: Very fatigued and somnolent appearing.  Moves all 4 extremities to commands  Psychiatric:        Mood and Affect: Mood normal.      Consultants:    Procedures:    Data Reviewed: Results for orders placed or performed during the hospital encounter of 01/02/24 (from the past 24 hours)  CBC     Status: Abnormal   Collection Time: 01/03/24  4:53 AM  Result Value Ref Range   WBC 7.2 4.0 - 10.5 K/uL   RBC 3.47 (L) 3.87 - 5.11 MIL/uL   Hemoglobin 10.7 (L) 12.0 - 15.0 g/dL   HCT 66.2 (L) 63.9 - 53.9 %   MCV 97.1 80.0 - 100.0 fL   MCH 30.8 26.0 - 34.0 pg   MCHC 31.8 30.0 - 36.0 g/dL   RDW 85.2 88.4 - 84.4 %   Platelets 106 (L) 150 - 400 K/uL   nRBC 0.0 0.0 - 0.2 %  Comprehensive metabolic  panel     Status: Abnormal   Collection Time: 01/03/24  4:53 AM  Result Value Ref Range   Sodium 141 135 - 145 mmol/L   Potassium 3.6 3.5 - 5.1 mmol/L   Chloride 114 (H) 98 - 111 mmol/L   CO2 17 (L) 22 - 32 mmol/L   Glucose, Bld 85 70 - 99 mg/dL   BUN 23 8 - 23 mg/dL   Creatinine, Ser 8.88 (H) 0.44 - 1.00 mg/dL   Calcium  8.5 (L) 8.9 - 10.3 mg/dL   Total Protein 5.0 (L) 6.5 - 8.1 g/dL   Albumin  2.7 (L) 3.5 - 5.0 g/dL   AST 30 15 - 41 U/L   ALT 18 0 - 44 U/L   Alkaline Phosphatase 116  38 - 126 U/L   Total Bilirubin 1.8 (H) 0.0 - 1.2 mg/dL   GFR, Estimated 55 (L) >60 mL/min   Anion gap 11 5 - 15  Blood gas, venous     Status: Abnormal   Collection Time: 01/03/24  1:35 PM  Result Value Ref Range   pH, Ven 7.38 7.25 - 7.43   pCO2, Ven 33 (L) 44 - 60 mmHg   pO2, Ven 67 (H) 32 - 45 mmHg   Bicarbonate 19.5 (L) 20.0 - 28.0 mmol/L   Acid-base deficit 4.7 (H) 0.0 - 2.0 mmol/L   O2 Saturation 96.1 %   Patient temperature 36.8     I have reviewed pertinent nursing notes, vitals, labs, and images as necessary. I have ordered labwork to follow up on as indicated.  I have reviewed the last notes from staff over past 24 hours. I have discussed patient's care plan and test results with nursing staff, CM/SW, and other staff as appropriate.  Old records reviewed in assessment of this patient  Time spent: Greater than 50% of the 55 minute visit was spent in counseling/coordination of care for the patient as laid out in the A&P.   LOS: 0 days   Alm Apo, MD [facility] 01/03/2024, 3:57 PM "

## 2024-01-03 NOTE — Care Management Obs Status (Signed)
 MEDICARE OBSERVATION STATUS NOTIFICATION   Patient Details  Name: [name] MRN: [id number] Date of Birth: Jun 06, 1955   Medicare Observation Status Notification Given:  [name], LCSW 01/03/2024, 1:43 PM

## 2024-01-03 NOTE — Plan of Care (Signed)
  Problem: Activity: Goal: Risk for activity intolerance will decrease Outcome: Progressing   Problem: Pain Managment: Goal: General experience of comfort will improve and/or be controlled Outcome: Progressing

## 2024-01-03 NOTE — Hospital Course (Addendum)
 [name] is a 68 y.o. female with medical history significant for [name] and associated hepatic encephalopathy, depression, short gut syndrome with chronic diarrhea, stage IIIa CKD with anemia of chronic disease, hypothyroidism, chronic hypotension recent hospitalization until 12/18 with recurrent hepatic encephalopathy in the setting of UTI who now returns with recurrent confusion.   History is provided by her son, with whom she lives and who cares for her closely.  States that she initially was doing well when she went home, seemed to be eating well and approaching back to her baseline.  She completed a couple of days of cefadroxil  which was for her E. coli UTI.  She has a chronic cough, she has continued to be compliant with her rifaximin  and lactulose , having several bowel movements a day.    Assessment/Plan  Abdominal pain - worsening abd pain today compared to baseline pains; now shivering and had multiple episodes of vomiting since yesterday - known abdominal hernia but has been stable - CT A/P reassuring.  Pain also spontaneously resolved and back to baseline on day of discharge  Acute metabolic hepatic encephalopathy - resolved  - unclear etiology on admission; NH3 was lower than prior and no major concern for infection. Possible that renal function contributed but BUN still not high either - Lasix  dose reduced to match 5:2 ratio for cirrhosis -VBG reassuring -Mentation is better and clinically she looks better today - may need repeat sleep study too at home at some point; SpO2 is 100% on RA (checked personally)   CKD stage IIIa - patient has history of CKD3a. Baseline creat ~ 1.2, eGFR~ 54-56 - creat not meeting AKI definition, but did have higher BUN and ratio elevated possibly from overdiuresis and/or depletion in general - continue diet - Lasix  resumed at home dose at discharge but if presents back with worsening renal function again, consider permanent decrease of  Lasix  dose  Chronic metabolic acidosis -due to chronic diarrhea from short gut syndrome -Continue bicarb   Hypothyroidism-Synthroid    Chronic hypotension-continue midodrine , with holding parameters   GERD-Protonix  daily   Hyperlipidemia-Lipitor   Depression-venlafaxine    Chronic back pain-will continue low-dose opiate as needed

## 2024-01-04 DIAGNOSIS — K7460 Unspecified cirrhosis of liver: Secondary | ICD-10-CM | POA: Diagnosis present

## 2024-01-04 DIAGNOSIS — Z66 Do not resuscitate: Secondary | ICD-10-CM | POA: Diagnosis present

## 2024-01-04 DIAGNOSIS — Z79899 Other long term (current) drug therapy: Secondary | ICD-10-CM | POA: Diagnosis not present

## 2024-01-04 DIAGNOSIS — K7469 Other cirrhosis of liver: Secondary | ICD-10-CM

## 2024-01-04 DIAGNOSIS — N1831 Chronic kidney disease, stage 3a: Secondary | ICD-10-CM | POA: Diagnosis present

## 2024-01-04 DIAGNOSIS — D631 Anemia in chronic kidney disease: Secondary | ICD-10-CM | POA: Diagnosis present

## 2024-01-04 DIAGNOSIS — E785 Hyperlipidemia, unspecified: Secondary | ICD-10-CM | POA: Diagnosis present

## 2024-01-04 DIAGNOSIS — N179 Acute kidney failure, unspecified: Secondary | ICD-10-CM | POA: Diagnosis present

## 2024-01-04 DIAGNOSIS — I129 Hypertensive chronic kidney disease with stage 1 through stage 4 chronic kidney disease, or unspecified chronic kidney disease: Secondary | ICD-10-CM | POA: Diagnosis present

## 2024-01-04 DIAGNOSIS — G9341 Metabolic encephalopathy: Secondary | ICD-10-CM | POA: Diagnosis not present

## 2024-01-04 DIAGNOSIS — R5381 Other malaise: Secondary | ICD-10-CM | POA: Diagnosis not present

## 2024-01-04 DIAGNOSIS — E8722 Chronic metabolic acidosis: Secondary | ICD-10-CM | POA: Diagnosis present

## 2024-01-04 DIAGNOSIS — Z83438 Family history of other disorder of lipoprotein metabolism and other lipidemia: Secondary | ICD-10-CM | POA: Diagnosis not present

## 2024-01-04 DIAGNOSIS — Z86711 Personal history of pulmonary embolism: Secondary | ICD-10-CM | POA: Diagnosis not present

## 2024-01-04 DIAGNOSIS — K219 Gastro-esophageal reflux disease without esophagitis: Secondary | ICD-10-CM | POA: Diagnosis present

## 2024-01-04 DIAGNOSIS — G8929 Other chronic pain: Secondary | ICD-10-CM | POA: Diagnosis present

## 2024-01-04 DIAGNOSIS — F32A Depression, unspecified: Secondary | ICD-10-CM | POA: Diagnosis present

## 2024-01-04 DIAGNOSIS — Z8249 Family history of ischemic heart disease and other diseases of the circulatory system: Secondary | ICD-10-CM | POA: Diagnosis not present

## 2024-01-04 DIAGNOSIS — K7682 Hepatic encephalopathy: Secondary | ICD-10-CM | POA: Diagnosis not present

## 2024-01-04 DIAGNOSIS — K90829 Short bowel syndrome, unspecified: Secondary | ICD-10-CM | POA: Diagnosis present

## 2024-01-04 DIAGNOSIS — Z87891 Personal history of nicotine dependence: Secondary | ICD-10-CM | POA: Diagnosis not present

## 2024-01-04 DIAGNOSIS — K7581 Nonalcoholic steatohepatitis (NASH): Secondary | ICD-10-CM

## 2024-01-04 DIAGNOSIS — Z1152 Encounter for screening for COVID-19: Secondary | ICD-10-CM | POA: Diagnosis not present

## 2024-01-04 DIAGNOSIS — Z833 Family history of diabetes mellitus: Secondary | ICD-10-CM | POA: Diagnosis not present

## 2024-01-04 DIAGNOSIS — E039 Hypothyroidism, unspecified: Secondary | ICD-10-CM | POA: Diagnosis present

## 2024-01-04 DIAGNOSIS — E669 Obesity, unspecified: Secondary | ICD-10-CM | POA: Diagnosis present

## 2024-01-04 DIAGNOSIS — Z79890 Hormone replacement therapy (postmenopausal): Secondary | ICD-10-CM | POA: Diagnosis not present

## 2024-01-04 LAB — CBC WITH DIFFERENTIAL/PLATELET
Abs Immature Granulocytes: 0.02 K/uL (ref 0.00–0.07)
Basophils Absolute: 0 K/uL (ref 0.0–0.1)
Basophils Relative: 1 %
Eosinophils Absolute: 0.6 K/uL — ABNORMAL HIGH (ref 0.0–0.5)
Eosinophils Relative: 9 %
HCT: 32.9 % — ABNORMAL LOW (ref 36.0–46.0)
Hemoglobin: 10.4 g/dL — ABNORMAL LOW (ref 12.0–15.0)
Immature Granulocytes: 0 %
Lymphocytes Relative: 20 %
Lymphs Abs: 1.4 K/uL (ref 0.7–4.0)
MCH: 30.8 pg (ref 26.0–34.0)
MCHC: 31.6 g/dL (ref 30.0–36.0)
MCV: 97.3 fL (ref 80.0–100.0)
Monocytes Absolute: 0.6 K/uL (ref 0.1–1.0)
Monocytes Relative: 8 %
Neutro Abs: 4.3 K/uL (ref 1.7–7.7)
Neutrophils Relative %: 62 %
Platelets: 104 K/uL — ABNORMAL LOW (ref 150–400)
RBC: 3.38 MIL/uL — ABNORMAL LOW (ref 3.87–5.11)
RDW: 14.8 % (ref 11.5–15.5)
WBC: 6.9 K/uL (ref 4.0–10.5)
nRBC: 0 % (ref 0.0–0.2)

## 2024-01-04 LAB — COMPREHENSIVE METABOLIC PANEL WITH GFR
ALT: 17 U/L (ref 0–44)
AST: 25 U/L (ref 15–41)
Albumin: 2.5 g/dL — ABNORMAL LOW (ref 3.5–5.0)
Alkaline Phosphatase: 119 U/L (ref 38–126)
Anion gap: 10 (ref 5–15)
BUN: 22 mg/dL (ref 8–23)
CO2: 17 mmol/L — ABNORMAL LOW (ref 22–32)
Calcium: 8.2 mg/dL — ABNORMAL LOW (ref 8.9–10.3)
Chloride: 116 mmol/L — ABNORMAL HIGH (ref 98–111)
Creatinine, Ser: 1.07 mg/dL — ABNORMAL HIGH (ref 0.44–1.00)
GFR, Estimated: 57 mL/min — ABNORMAL LOW
Glucose, Bld: 124 mg/dL — ABNORMAL HIGH (ref 70–99)
Potassium: 3.5 mmol/L (ref 3.5–5.1)
Sodium: 143 mmol/L (ref 135–145)
Total Bilirubin: 1.7 mg/dL — ABNORMAL HIGH (ref 0.0–1.2)
Total Protein: 4.6 g/dL — ABNORMAL LOW (ref 6.5–8.1)

## 2024-01-04 LAB — MAGNESIUM: Magnesium: 1.8 mg/dL (ref 1.7–2.4)

## 2024-01-04 NOTE — Plan of Care (Signed)

## 2024-01-04 NOTE — Progress Notes (Signed)
 " Progress Note    [name]   FMW:[id number]  DOB: [birth year]  DOA: 01/02/2024     0 PCP: [name], MD  Initial CC: Altered mentation  Hospital Course: [name] is a 68 y.o. female with medical history significant for [name] and associated hepatic encephalopathy, depression, short gut syndrome with chronic diarrhea, stage IIIa CKD with anemia of chronic disease, hypothyroidism, chronic hypotension recent hospitalization until 12/18 with recurrent hepatic encephalopathy in the setting of UTI who now returns with recurrent confusion.   History is provided by her son, with whom she lives and who cares for her closely.  States that she initially was doing well when she went home, seemed to be eating well and approaching back to her baseline.  She completed a couple of days of cefadroxil  which was for her E. coli UTI.  She has a chronic cough, she has continued to be compliant with her rifaximin  and lactulose , having several bowel movements a day.    Assessment/Plan   Acute metabolic hepatic encephalopathy  - unclear etiology on admission; NH3 was lower than prior and no major concern for infection. Possible that renal function contributed but BUN still not high either - Lasix  dose reduced to match 5:2 ratio for cirrhosis -VBG reassuring -Mentation is better and clinically she looks better today - may need repeat sleep study too at home at some point; SpO2 is 100% on RA (checked personally) - continue monitoring    CKD stage IIIa - patient has history of CKD3a. Baseline creat ~ 1.2, eGFR~ 54-56 - creat not meeting AKI definition, but did have higher BUN and ratio elevated possibly from overdiuresis and/or depletion in general - continue diet - will decrease lasix  dose  Chronic metabolic acidosis -due to chronic diarrhea from short gut syndrome -Continue bicarb   Hypothyroidism-Synthroid    Chronic hypotension-continue midodrine , with holding parameters    GERD-Protonix  daily   Hyperlipidemia-Lipitor   Depression-venlafaxine    Chronic back pain-will continue low-dose opiate as needed  Interval History:  No events overnight.  Mentation is further improved today.  Son present bedside. Suspect ready for discharge tomorrow.  Antimicrobials:   DVT prophylaxis:  heparin  injection 5,000 Units Start: 01/02/24 2200   Code Status:   Code Status: Do not attempt resuscitation (DNR) PRE-ARREST INTERVENTIONS DESIRED  Mobility Assessment (Last 72 Hours)     Mobility Assessment     Row Name 01/04/24 0733 01/03/24 1930 01/03/24 0827 01/03/24 0129 01/02/24 2016   Does the patient have exclusion criteria? No- Perform mobility assessment No- Perform mobility assessment No- Perform mobility assessment No- Perform mobility assessment No- Perform mobility assessment   What is the highest level of mobility based on the mobility assessment? Level 4 (Ambulates with assistance) - Balance while stepping forward/back - Complete Level 3 (Stands with assistance) - Balance while standing  and cannot march in place Level 4 (Ambulates with assistance) - Balance while stepping forward/back - Complete Level 3 (Stands with assistance) - Balance while standing  and cannot march in place Level 3 (Stands with assistance) - Balance while standing  and cannot march in place   Is the above level different from baseline mobility prior to current illness? Yes - Recommend PT order Yes - Recommend PT order Yes - Recommend PT order Yes - Recommend PT order Yes - Recommend PT order    Row Name 01/02/24 1656           Does the patient have exclusion criteria? No- Perform mobility assessment  What is the highest level of mobility based on the mobility assessment? Level 3 (Stands with assistance) - Balance while standing  and cannot march in place       Is the above level different from baseline mobility prior to current illness? Yes - Recommend PT order           Diet: Diet Orders (From admission, onward)     Start     Ordered   01/02/24 1536  Diet regular Room service appropriate? Yes; Fluid consistency: Thin  Diet effective now       Question Answer Comment  Room service appropriate? Yes   Fluid consistency: Thin      01/02/24 1536            Barriers to discharge: None Disposition Plan: Home HH orders placed: N/A Status is: Observation  Objective: Blood pressure 121/68, pulse 81, temperature 98.4 F (36.9 C), temperature source Oral, resp. rate 18, height 5' 2 (1.575 m), weight 95 kg, SpO2 100%.  Examination:  Physical Exam Constitutional:      Comments: Much more awake and alert today.    HENT:     Head: Normocephalic and atraumatic.     Mouth/Throat:     Mouth: Mucous membranes are moist.  Eyes:     Comments: Chronic opacified left eye noted  Cardiovascular:     Rate and Rhythm: Normal rate and regular rhythm.  Pulmonary:     Effort: Pulmonary effort is normal. No respiratory distress.     Breath sounds: Normal breath sounds. No wheezing.  Abdominal:     General: Bowel sounds are normal. There is no distension.     Palpations: Abdomen is soft.     Tenderness: There is generalized abdominal tenderness.     Comments: Hernia appreciated.  Generalized associated tenderness  Musculoskeletal:     Cervical back: Normal range of motion and neck supple.     Comments: Bilateral lower extremity edema, approximately 2-3+  Skin:    General: Skin is warm and dry.  Neurological:     General: No focal deficit present.  Psychiatric:        Mood and Affect: Mood normal.      Consultants:    Procedures:    Data Reviewed: Results for orders placed or performed during the hospital encounter of 01/02/24 (from the past 24 hours)  CBC with Differential/Platelet     Status: Abnormal   Collection Time: 01/04/24  4:59 AM  Result Value Ref Range   WBC 6.9 4.0 - 10.5 K/uL   RBC 3.38 (L) 3.87 - 5.11 MIL/uL   Hemoglobin 10.4  (L) 12.0 - 15.0 g/dL   HCT 67.0 (L) 63.9 - 53.9 %   MCV 97.3 80.0 - 100.0 fL   MCH 30.8 26.0 - 34.0 pg   MCHC 31.6 30.0 - 36.0 g/dL   RDW 85.1 88.4 - 84.4 %   Platelets 104 (L) 150 - 400 K/uL   nRBC 0.0 0.0 - 0.2 %   Neutrophils Relative % 62 %   Neutro Abs 4.3 1.7 - 7.7 K/uL   Lymphocytes Relative 20 %   Lymphs Abs 1.4 0.7 - 4.0 K/uL   Monocytes Relative 8 %   Monocytes Absolute 0.6 0.1 - 1.0 K/uL   Eosinophils Relative 9 %   Eosinophils Absolute 0.6 (H) 0.0 - 0.5 K/uL   Basophils Relative 1 %   Basophils Absolute 0.0 0.0 - 0.1 K/uL   Immature Granulocytes 0 %   Abs  Immature Granulocytes 0.02 0.00 - 0.07 K/uL  Comprehensive metabolic panel with GFR     Status: Abnormal   Collection Time: 01/04/24  4:59 AM  Result Value Ref Range   Sodium 143 135 - 145 mmol/L   Potassium 3.5 3.5 - 5.1 mmol/L   Chloride 116 (H) 98 - 111 mmol/L   CO2 17 (L) 22 - 32 mmol/L   Glucose, Bld 124 (H) 70 - 99 mg/dL   BUN 22 8 - 23 mg/dL   Creatinine, Ser 8.92 (H) 0.44 - 1.00 mg/dL   Calcium  8.2 (L) 8.9 - 10.3 mg/dL   Total Protein 4.6 (L) 6.5 - 8.1 g/dL   Albumin  2.5 (L) 3.5 - 5.0 g/dL   AST 25 15 - 41 U/L   ALT 17 0 - 44 U/L   Alkaline Phosphatase 119 38 - 126 U/L   Total Bilirubin 1.7 (H) 0.0 - 1.2 mg/dL   GFR, Estimated 57 (L) >60 mL/min   Anion gap 10 5 - 15  Magnesium      Status: None   Collection Time: 01/04/24  4:59 AM  Result Value Ref Range   Magnesium  1.8 1.7 - 2.4 mg/dL    I have reviewed pertinent nursing notes, vitals, labs, and images as necessary. I have ordered labwork to follow up on as indicated.  I have reviewed the last notes from staff over past 24 hours. I have discussed patient's care plan and test results with nursing staff, CM/SW, and other staff as appropriate.  Old records reviewed in assessment of this patient  Time spent: Greater than 50% of the 55 minute visit was spent in counseling/coordination of care for the patient as laid out in the A&P.   LOS: 0 days    Alm Apo, MD [facility] 01/04/2024, 2:06 PM "

## 2024-01-04 NOTE — Plan of Care (Signed)
   Problem: Health Behavior/Discharge Planning: Goal: Ability to manage health-related needs will improve Outcome: Progressing   Problem: Activity: Goal: Risk for activity intolerance will decrease Outcome: Progressing   Problem: Pain Managment: Goal: General experience of comfort will improve and/or be controlled Outcome: Progressing

## 2024-01-05 ENCOUNTER — Inpatient Hospital Stay (HOSPITAL_COMMUNITY): Payer: Medicare (Managed Care)

## 2024-01-05 DIAGNOSIS — K7581 Nonalcoholic steatohepatitis (NASH): Secondary | ICD-10-CM | POA: Diagnosis not present

## 2024-01-05 DIAGNOSIS — K7682 Hepatic encephalopathy: Secondary | ICD-10-CM | POA: Diagnosis not present

## 2024-01-05 DIAGNOSIS — R5381 Other malaise: Secondary | ICD-10-CM | POA: Diagnosis not present

## 2024-01-05 DIAGNOSIS — G9341 Metabolic encephalopathy: Secondary | ICD-10-CM | POA: Diagnosis not present

## 2024-01-05 MED ORDER — MUSCLE RUB 10-15 % EX CREA
TOPICAL_CREAM | CUTANEOUS | Status: DC | PRN
  Filled 2024-01-05: qty 85

## 2024-01-05 MED ORDER — BARIUM SULFATE 2 % PO SUSP
450.0000 mL | Freq: Once | ORAL | Status: AC
  Administered 2024-01-05: 450 mL via ORAL

## 2024-01-05 MED ORDER — MORPHINE SULFATE (PF) 2 MG/ML IV SOLN
2.0000 mg | INTRAVENOUS | Status: DC | PRN
  Administered 2024-01-05 – 2024-01-06 (×3): 2 mg via INTRAVENOUS
  Filled 2024-01-05 (×3): qty 1

## 2024-01-05 NOTE — Plan of Care (Signed)
  Problem: Clinical Measurements: Goal: Ability to maintain clinical measurements within normal limits will improve Outcome: Progressing   Problem: Activity: Goal: Risk for activity intolerance will decrease Outcome: Progressing   Problem: Nutrition: Goal: Adequate nutrition will be maintained Outcome: Progressing   Problem: Coping: Goal: Level of anxiety will decrease Outcome: Progressing   Problem: Pain Managment: Goal: General experience of comfort will improve and/or be controlled Outcome: Progressing

## 2024-01-05 NOTE — Plan of Care (Signed)
  Problem: Clinical Measurements: Goal: Diagnostic test results will improve Outcome: Progressing   Problem: Activity: Goal: Risk for activity intolerance will decrease Outcome: Progressing   Problem: Pain Managment: Goal: General experience of comfort will improve and/or be controlled Outcome: Progressing

## 2024-01-05 NOTE — Progress Notes (Signed)
 " Progress Note    [name]   FMW:[id number]  DOB: 05-22-1955  DOA: 01/02/2024     1 PCP: [name], MD  Initial CC: Altered mentation  Hospital Course: [name] is a 68 y.o. female with medical history significant for [name] and associated hepatic encephalopathy, depression, short gut syndrome with chronic diarrhea, stage IIIa CKD with anemia of chronic disease, hypothyroidism, chronic hypotension recent hospitalization until 12/18 with recurrent hepatic encephalopathy in the setting of UTI who now returns with recurrent confusion.   History is provided by her son, with whom she lives and who cares for her closely.  States that she initially was doing well when she went home, seemed to be eating well and approaching back to her baseline.  She completed a couple of days of cefadroxil  which was for her E. coli UTI.  She has a chronic cough, she has continued to be compliant with her rifaximin  and lactulose , having several bowel movements a day.    Assessment/Plan   Abdominal pain - worsening abd pain today compared to baseline pains; now shivering and had multiple episodes of vomiting since yesterday - known abdominal hernia but has been stable - will gets CT A/P (contrast allergy) and further eval today  Acute metabolic hepatic encephalopathy - resolved  - unclear etiology on admission; NH3 was lower than prior and no major concern for infection. Possible that renal function contributed but BUN still not high either - Lasix  dose reduced to match 5:2 ratio for cirrhosis -VBG reassuring -Mentation is better and clinically she looks better today - may need repeat sleep study too at home at some point; SpO2 is 100% on RA (checked personally) - continue monitoring    CKD stage IIIa - patient has history of CKD3a. Baseline creat ~ 1.2, eGFR~ 54-56 - creat not meeting AKI definition, but did have higher BUN and ratio elevated possibly from overdiuresis and/or  depletion in general - continue diet - will decrease lasix  dose  Chronic metabolic acidosis -due to chronic diarrhea from short gut syndrome -Continue bicarb   Hypothyroidism-Synthroid    Chronic hypotension-continue midodrine , with holding parameters   GERD-Protonix  daily   Hyperlipidemia-Lipitor   Depression-venlafaxine    Chronic back pain-will continue low-dose opiate as needed  Interval History:  Mentation better but now having worsening abdominal pain this morning.  She is visibly in pain and having shivering and clearly does not feel well.  Antimicrobials:   DVT prophylaxis:  heparin  injection 5,000 Units Start: 01/02/24 2200   Code Status:   Code Status: Do not attempt resuscitation (DNR) PRE-ARREST INTERVENTIONS DESIRED  Mobility Assessment (Last 72 Hours)     Mobility Assessment     Row Name 01/05/24 0900 01/04/24 2200 01/04/24 0733 01/03/24 1930 01/03/24 0827   Does the patient have exclusion criteria? No- Perform mobility assessment No- Perform mobility assessment No- Perform mobility assessment No- Perform mobility assessment No- Perform mobility assessment   What is the highest level of mobility based on the mobility assessment? Level 4 (Ambulates with assistance) - Balance while stepping forward/back - Complete Level 4 (Ambulates with assistance) - Balance while stepping forward/back - Complete Level 4 (Ambulates with assistance) - Balance while stepping forward/back - Complete Level 3 (Stands with assistance) - Balance while standing  and cannot march in place Level 4 (Ambulates with assistance) - Balance while stepping forward/back - Complete   Is the above level different from baseline mobility prior to current illness? -- Yes - Recommend PT order Yes -  Recommend PT order Yes - Recommend PT order Yes - Recommend PT order    Row Name 01/03/24 0129 01/02/24 2016 01/02/24 1656       Does the patient have exclusion criteria? No- Perform mobility assessment No-  Perform mobility assessment No- Perform mobility assessment     What is the highest level of mobility based on the mobility assessment? Level 3 (Stands with assistance) - Balance while standing  and cannot march in place Level 3 (Stands with assistance) - Balance while standing  and cannot march in place Level 3 (Stands with assistance) - Balance while standing  and cannot march in place     Is the above level different from baseline mobility prior to current illness? Yes - Recommend PT order Yes - Recommend PT order Yes - Recommend PT order        Diet: Diet Orders (From admission, onward)     Start     Ordered   01/02/24 1536  Diet regular Room service appropriate? Yes; Fluid consistency: Thin  Diet effective now       Question Answer Comment  Room service appropriate? Yes   Fluid consistency: Thin      01/02/24 1536            Barriers to discharge: None Disposition Plan: Home HH orders placed: N/A Status is: Observation  Objective: Blood pressure 134/81, pulse 81, temperature 98 F (36.7 C), resp. rate 16, height 5' 2 (1.575 m), weight 95 kg, SpO2 100%.  Examination:  Physical Exam Constitutional:      Comments: Much more awake and alert today.    HENT:     Head: Normocephalic and atraumatic.     Mouth/Throat:     Mouth: Mucous membranes are moist.  Eyes:     Comments: Chronic opacified left eye noted  Cardiovascular:     Rate and Rhythm: Normal rate and regular rhythm.  Pulmonary:     Effort: Pulmonary effort is normal. No respiratory distress.     Breath sounds: Normal breath sounds. No wheezing.  Abdominal:     General: Bowel sounds are normal. There is no distension.     Palpations: Abdomen is soft.     Tenderness: There is generalized abdominal tenderness (generalized and worse today).     Comments: Hernia appreciated.  Generalized associated tenderness  Musculoskeletal:     Cervical back: Normal range of motion and neck supple.     Comments: Bilateral  lower extremity edema, approximately 2-3+  Skin:    General: Skin is warm and dry.  Neurological:     General: No focal deficit present.  Psychiatric:        Mood and Affect: Mood normal.      Consultants:    Procedures:    Data Reviewed: No results found for this or any previous visit (from the past 24 hours).   I have reviewed pertinent nursing notes, vitals, labs, and images as necessary. I have ordered labwork to follow up on as indicated.  I have reviewed the last notes from staff over past 24 hours. I have discussed patient's care plan and test results with nursing staff, CM/SW, and other staff as appropriate.  Old records reviewed in assessment of this patient  Time spent: Greater than 50% of the 55 minute visit was spent in counseling/coordination of care for the patient as laid out in the A&P.   LOS: 1 day   Alm Apo, MD [facility] 01/05/2024, 2:57 PM "

## 2024-01-06 LAB — CBC WITH DIFFERENTIAL/PLATELET
Abs Immature Granulocytes: 0.01 K/uL (ref 0.00–0.07)
Basophils Absolute: 0 K/uL (ref 0.0–0.1)
Basophils Relative: 0 %
Eosinophils Absolute: 0.6 K/uL — ABNORMAL HIGH (ref 0.0–0.5)
Eosinophils Relative: 10 %
HCT: 31.6 % — ABNORMAL LOW (ref 36.0–46.0)
Hemoglobin: 10 g/dL — ABNORMAL LOW (ref 12.0–15.0)
Immature Granulocytes: 0 %
Lymphocytes Relative: 28 %
Lymphs Abs: 1.5 K/uL (ref 0.7–4.0)
MCH: 30.8 pg (ref 26.0–34.0)
MCHC: 31.6 g/dL (ref 30.0–36.0)
MCV: 97.2 fL (ref 80.0–100.0)
Monocytes Absolute: 0.4 K/uL (ref 0.1–1.0)
Monocytes Relative: 8 %
Neutro Abs: 2.9 K/uL (ref 1.7–7.7)
Neutrophils Relative %: 54 %
Platelets: 117 K/uL — ABNORMAL LOW (ref 150–400)
RBC: 3.25 MIL/uL — ABNORMAL LOW (ref 3.87–5.11)
RDW: 14.6 % (ref 11.5–15.5)
WBC: 5.4 K/uL (ref 4.0–10.5)
nRBC: 0 % (ref 0.0–0.2)

## 2024-01-06 LAB — COMPREHENSIVE METABOLIC PANEL WITH GFR
ALT: 16 U/L (ref 0–44)
AST: 26 U/L (ref 15–41)
Albumin: 2.5 g/dL — ABNORMAL LOW (ref 3.5–5.0)
Alkaline Phosphatase: 118 U/L (ref 38–126)
Anion gap: 12 (ref 5–15)
BUN: 23 mg/dL (ref 8–23)
CO2: 17 mmol/L — ABNORMAL LOW (ref 22–32)
Calcium: 8.5 mg/dL — ABNORMAL LOW (ref 8.9–10.3)
Chloride: 112 mmol/L — ABNORMAL HIGH (ref 98–111)
Creatinine, Ser: 1.26 mg/dL — ABNORMAL HIGH (ref 0.44–1.00)
GFR, Estimated: 47 mL/min — ABNORMAL LOW
Glucose, Bld: 119 mg/dL — ABNORMAL HIGH (ref 70–99)
Potassium: 3.6 mmol/L (ref 3.5–5.1)
Sodium: 141 mmol/L (ref 135–145)
Total Bilirubin: 1.3 mg/dL — ABNORMAL HIGH (ref 0.0–1.2)
Total Protein: 4.9 g/dL — ABNORMAL LOW (ref 6.5–8.1)

## 2024-01-06 NOTE — Discharge Summary (Signed)
 " Physician Discharge Summary   [name] FMW:[id number] DOB: [birth year] DOA: 01/02/2024  PCP: [name], MD  Admit date: 01/02/2024 Discharge date: 01/06/2024  Admitted From: Home  Disposition:  Home  Discharging physician: Alm Apo, MD Barriers to discharge: none  Recommendations at discharge: If recurrent AKI, may need lasix  reduced to 20 mg daily  Discharge Condition: stable CODE STATUS: DNR Diet recommendation:  Diet Orders (From admission, onward)     Start     Ordered   01/06/24 0000  Diet general        01/06/24 1203   01/02/24 1536  Diet regular Room service appropriate? Yes; Fluid consistency: Thin  Diet effective now       Question Answer Comment  Room service appropriate? Yes   Fluid consistency: Thin      01/02/24 1536            Hospital Course: [name] is a 68 y.o. female with medical history significant for [name] and associated hepatic encephalopathy, depression, short gut syndrome with chronic diarrhea, stage IIIa CKD with anemia of chronic disease, hypothyroidism, chronic hypotension recent hospitalization until 12/18 with recurrent hepatic encephalopathy in the setting of UTI who now returns with recurrent confusion.   History is provided by her son, with whom she lives and who cares for her closely.  States that she initially was doing well when she went home, seemed to be eating well and approaching back to her baseline.  She completed a couple of days of cefadroxil  which was for her E. coli UTI.  She has a chronic cough, she has continued to be compliant with her rifaximin  and lactulose , having several bowel movements a day.    Assessment/Plan  Abdominal pain - worsening abd pain today compared to baseline pains; now shivering and had multiple episodes of vomiting since yesterday - known abdominal hernia but has been stable - CT A/P reassuring.  Pain also spontaneously resolved and back to baseline on day of  discharge  Acute metabolic hepatic encephalopathy - resolved  - unclear etiology on admission; NH3 was lower than prior and no major concern for infection. Possible that renal function contributed but BUN still not high either - Lasix  dose reduced to match 5:2 ratio for cirrhosis -VBG reassuring -Mentation is better and clinically she looks better today - may need repeat sleep study too at home at some point; SpO2 is 100% on RA (checked personally)   CKD stage IIIa - patient has history of CKD3a. Baseline creat ~ 1.2, eGFR~ 54-56 - creat not meeting AKI definition, but did have higher BUN and ratio elevated possibly from overdiuresis and/or depletion in general - continue diet - Lasix  resumed at home dose at discharge but if presents back with worsening renal function again, consider permanent decrease of Lasix  dose  Chronic metabolic acidosis -due to chronic diarrhea from short gut syndrome -Continue bicarb   Hypothyroidism-Synthroid    Chronic hypotension-continue midodrine , with holding parameters   GERD-Protonix  daily   Hyperlipidemia-Lipitor   Depression-venlafaxine    Chronic back pain-will continue low-dose opiate as needed    The patient's acute and chronic medical conditions were treated accordingly. On day of discharge, patient was felt deemed stable for discharge. Patient/family member advised to call PCP or come back to ER if needed.   Principal Diagnosis: Hepatic encephalopathy [facility])  Discharge Diagnoses: Active Hospital Problems   Diagnosis Date Noted   Hepatic encephalopathy (HCC) 06/29/2023    Priority: 1.   Liver cirrhosis secondary to NASH (  HCC) 10/03/2021    Priority: 2.   Acute metabolic encephalopathy 05/23/2023   Physical deconditioning 08/17/2022   Pseudophakia of left eye 02/08/2022    Resolved Hospital Problems  No resolved problems to display.     Discharge Instructions     Diet general   Complete by: As directed    Increase activity  slowly   Complete by: As directed       Allergies as of 01/06/2024       Reactions   Ace Inhibitors Anaphylaxis, Swelling   Gadolinium Derivatives Anaphylaxis   Ivp Dye [iodinated Contrast Media] Anaphylaxis   Wound Dressing Adhesive Dermatitis   Desitin [zinc  Oxide] Rash, Other (See Comments)   Worsens rash   Nsaids Other (See Comments)   Contraindication due to CKD    Butrans [buprenorphine] Other (See Comments)   Discontinued due to liver function   Yellow Jacket Venom Swelling   Hibiclens  [chlorhexidine  Gluconate] Rash, Other (See Comments)   Worsens rash   Penicillins Dermatitis, Rash   Vibramycin [doxycycline] Rash   Zofran  [ondansetron ] Rash        Medication List     TAKE these medications    rOPINIRole  0.25 MG tablet Commonly known as: REQUIP  TAKE 1 TABLET(0.25 MG) BY MOUTH AT BEDTIME The timing of this medication is very important.   acetaminophen  500 MG tablet Commonly known as: TYLENOL  Take 500-1,000 mg by mouth every 6 (six) hours as needed for headache or fever (pain).   albuterol  108 (90 Base) MCG/ACT inhaler Commonly known as: VENTOLIN  HFA Inhale 2 puffs into the lungs every 6 (six) hours as needed for wheezing or shortness of breath.   atorvastatin  40 MG tablet Commonly known as: LIPITOR TAKE 1 TABLET(40 MG) BY MOUTH IN THE MORNING What changed: See the new instructions.   CENTRUM SILVER  ULTRA WOMENS PO Take 1 tablet by mouth daily with breakfast.   colchicine  0.6 MG tablet Take 0.6 mg by mouth 2 (two) times daily as needed (as directed for gout flares).   Constulose  10 GM/15ML solution Generic drug: lactulose  Take 45 mLs (30 g total) by mouth 3 (three) times daily.   copper  tablet Take 2 mg by mouth in the morning and at bedtime.   cyanocobalamin  1000 MCG tablet Commonly known as: VITAMIN B12 Take 2,000 mcg by mouth every Monday, Wednesday, and Friday.   dicyclomine  10 MG capsule Commonly known as: BENTYL  Take 1 capsule (10 mg  total) by mouth 3 (three) times daily as needed for spasms (abdominal cramping).   EPINEPHrine  0.3 mg/0.3 mL Soaj injection Commonly known as: EPI-PEN Inject 0.3 mg into the muscle as needed for anaphylaxis.   famotidine  20 MG tablet Commonly known as: PEPCID  Take 20 mg by mouth daily as needed for heartburn or indigestion.   furosemide  40 MG tablet Commonly known as: LASIX  Take 1 tablet (40 mg total) by mouth daily.   HYDROcodone -acetaminophen  10-325 MG tablet Commonly known as: NORCO Take 1 tablet by mouth 3 (three) times daily as needed (for pain- may take up to a sum total of 3 tablets/24 hours).   IRON -VITAMIN C  PO Take 1 tablet by mouth daily.   levothyroxine  175 MCG tablet Commonly known as: SYNTHROID  Take 1 tablet (175 mcg total) by mouth daily before breakfast.   magnesium  gluconate 500 (27 Mg) MG Tabs tablet Commonly known as: MAGONATE Take 1 tablet (500 mg total) by mouth in the morning and at bedtime. What changed:  how much to take when to take this  meclizine  25 MG tablet Commonly known as: ANTIVERT  Take 1 tablet (25 mg total) by mouth 3 (three) times daily as needed for dizziness.   megestrol  40 MG tablet Commonly known as: MEGACE  Take 1 tablet (40 mg total) by mouth 2 (two) times daily.   methocarbamol  500 MG tablet Commonly known as: ROBAXIN  Take 500 mg by mouth in the morning.   midodrine  10 MG tablet Commonly known as: PROAMATINE  Take 10 mg by mouth 3 (three) times daily.   naloxone 4 MG/0.1ML Liqd nasal spray kit Commonly known as: NARCAN Place 1 spray into the nose as needed (accidental overdose).   Nucynta  ER 200 MG Tb12 Generic drug: Tapentadol  HCl Take 200 mg by mouth every 12 (twelve) hours as needed (for pain).   nystatin  cream Commonly known as: MYCOSTATIN  Apply 1 Application topically 2 (two) times daily as needed for dry skin. What changed: reasons to take this   pantoprazole  40 MG tablet Commonly known as: PROTONIX  TAKE 1  TABLET(40 MG) BY MOUTH TWICE DAILY BEFORE A MEAL What changed: See the new instructions.   potassium chloride  10 MEQ tablet Commonly known as: KLOR-CON  Take 2 tablets (20 mEq total) by mouth 2 (two) times daily.   rifaximin  550 MG Tabs tablet Commonly known as: XIFAXAN  Take 550 mg by mouth in the morning and at bedtime.   senna-docusate 8.6-50 MG tablet Commonly known as: Senokot-S Take 1 tablet by mouth 2 (two) times daily.   sodium bicarbonate  650 MG tablet Take 1 tablet (650 mg total) by mouth 2 (two) times daily.   spironolactone  50 MG tablet Commonly known as: ALDACTONE  Take 1 tablet (50 mg total) by mouth daily.   triamcinolone  cream 0.1 % Commonly known as: KENALOG  Apply 1 Application topically 2 (two) times daily as needed (skin irritation).   venlafaxine  XR 75 MG 24 hr capsule Commonly known as: EFFEXOR -XR Take 1 capsule (75 mg total) by mouth daily with breakfast.        Allergies[1]  Consultations:   Procedures:   Discharge Exam: BP 108/61 (BP Location: Left Arm)   Pulse 91   Temp 98.4 F (36.9 C)   Resp 16   Ht 5' 2 (1.575 m)   Wt 95 kg   SpO2 99%   BMI 38.31 kg/m  Physical Exam Constitutional:      Comments: Much more awake and alert today.    HENT:     Head: Normocephalic and atraumatic.     Mouth/Throat:     Mouth: Mucous membranes are moist.  Eyes:     Comments: Chronic opacified left eye noted  Cardiovascular:     Rate and Rhythm: Normal rate and regular rhythm.  Pulmonary:     Effort: Pulmonary effort is normal. No respiratory distress.     Breath sounds: Normal breath sounds. No wheezing.  Abdominal:     General: Bowel sounds are normal. There is no distension.     Palpations: Abdomen is soft.     Tenderness: There is no abdominal tenderness.     Comments: Hernia appreciated.  Generalized associated tenderness (chronic)  Musculoskeletal:     Cervical back: Normal range of motion and neck supple.     Comments: Bilateral  lower extremity edema, approximately 2-3+  Skin:    General: Skin is warm and dry.  Neurological:     General: No focal deficit present.  Psychiatric:        Mood and Affect: Mood normal.      The results  of significant diagnostics from this hospitalization (including imaging, microbiology, ancillary and laboratory) are listed below for reference.   Microbiology: Recent Results (from the past 240 hours)  Resp panel by RT-PCR (RSV, Flu A&B, Covid) Anterior Nasal Swab     Status: None   Collection Time: 01/02/24  1:33 PM   Specimen: Anterior Nasal Swab  Result Value Ref Range Status   SARS Coronavirus 2 by RT PCR NEGATIVE NEGATIVE Final    Comment: (NOTE) SARS-CoV-2 target nucleic acids are NOT DETECTED.  The SARS-CoV-2 RNA is generally detectable in upper respiratory specimens during the acute phase of infection. The lowest concentration of SARS-CoV-2 viral copies this assay can detect is 138 copies/mL. A negative result does not preclude SARS-Cov-2 infection and should not be used as the sole basis for treatment or other patient management decisions. A negative result may occur with  improper specimen collection/handling, submission of specimen other than nasopharyngeal swab, presence of viral mutation(s) within the areas targeted by this assay, and inadequate number of viral copies(<138 copies/mL). A negative result must be combined with clinical observations, patient history, and epidemiological information. The expected result is Negative.  Fact Sheet for Patients:  bloggercourse.com  Fact Sheet for Healthcare Providers:  seriousbroker.it  This test is no t yet approved or cleared by the [place]  FDA and  has been authorized for detection and/or diagnosis of SARS-CoV-2 by FDA under an Emergency Use Authorization (EUA). This EUA will remain  in effect (meaning this test can be used) for the duration of the COVID-19  declaration under Section 564(b)(1) of the Act, 21 U.S.C.section 360bbb-3(b)(1), unless the authorization is terminated  or revoked sooner.       Influenza A by PCR NEGATIVE NEGATIVE Final   Influenza B by PCR NEGATIVE NEGATIVE Final    Comment: (NOTE) The Xpert Xpress SARS-CoV-2/FLU/RSV plus assay is intended as an aid in the diagnosis of influenza from Nasopharyngeal swab specimens and should not be used as a sole basis for treatment. Nasal washings and aspirates are unacceptable for Xpert Xpress SARS-CoV-2/FLU/RSV testing.  Fact Sheet for Patients: bloggercourse.com  Fact Sheet for Healthcare Providers: seriousbroker.it  This test is not yet approved or cleared by the [place]  FDA and has been authorized for detection and/or diagnosis of SARS-CoV-2 by FDA under an Emergency Use Authorization (EUA). This EUA will remain in effect (meaning this test can be used) for the duration of the COVID-19 declaration under Section 564(b)(1) of the Act, 21 U.S.C. section 360bbb-3(b)(1), unless the authorization is terminated or revoked.     Resp Syncytial Virus by PCR NEGATIVE NEGATIVE Final    Comment: (NOTE) Fact Sheet for Patients: bloggercourse.com  Fact Sheet for Healthcare Providers: seriousbroker.it  This test is not yet approved or cleared by the [place]  FDA and has been authorized for detection and/or diagnosis of SARS-CoV-2 by FDA under an Emergency Use Authorization (EUA). This EUA will remain in effect (meaning this test can be used) for the duration of the COVID-19 declaration under Section 564(b)(1) of the Act, 21 U.S.C. section 360bbb-3(b)(1), unless the authorization is terminated or revoked.  Performed at [facility], [address] [place], [place] [address]      Labs: BNP (last 3 results) Recent Labs    08/24/23 1544  BNP  72.9   Basic Metabolic Panel: Recent Labs  Lab 01/02/24 1332 01/03/24 0453 01/04/24 0459 01/06/24 0501  NA 139 141 143 141  K 3.5 3.6 3.5 3.6  CL 114* 114*  116* 112*  CO2 16* 17* 17* 17*  GLUCOSE 153* 85 124* 119*  BUN 26* 23 22 23   CREATININE 1.40* 1.11* 1.07* 1.26*  CALCIUM  8.7* 8.5* 8.2* 8.5*  MG  --   --  1.8  --    Liver Function Tests: Recent Labs  Lab 01/02/24 1332 01/03/24 0453 01/04/24 0459 01/06/24 0501  AST 28 30 25 26   ALT 22 18 17 16   ALKPHOS 116 116 119 118  BILITOT 1.8* 1.8* 1.7* 1.3*  PROT 5.5* 5.0* 4.6* 4.9*  ALBUMIN  2.9* 2.7* 2.5* 2.5*   Recent Labs  Lab 01/02/24 1332  LIPASE 47   Recent Labs  Lab 01/02/24 1332  AMMONIA 58*   CBC: Recent Labs  Lab 01/02/24 1332 01/03/24 0453 01/04/24 0459 01/06/24 0501  WBC 7.7 7.2 6.9 5.4  NEUTROABS 5.3  --  4.3 2.9  HGB 11.1* 10.7* 10.4* 10.0*  HCT 35.2* 33.7* 32.9* 31.6*  MCV 98.3 97.1 97.3 97.2  PLT 118* 106* 104* 117*   Cardiac Enzymes: No results for input(s): CKTOTAL, CKMB, CKMBINDEX, TROPONINI in the last 168 hours. BNP: Invalid input(s): POCBNP CBG: No results for input(s): GLUCAP in the last 168 hours. D-Dimer No results for input(s): DDIMER in the last 72 hours. Hgb A1c No results for input(s): HGBA1C in the last 72 hours. Lipid Profile No results for input(s): CHOL, HDL, LDLCALC, TRIG, CHOLHDL, LDLDIRECT in the last 72 hours. Thyroid  function studies No results for input(s): TSH, T4TOTAL, T3FREE, THYROIDAB in the last 72 hours.  Invalid input(s): FREET3 Anemia work up No results for input(s): VITAMINB12, FOLATE, FERRITIN, TIBC, IRON , RETICCTPCT in the last 72 hours. Urinalysis    Component Value Date/Time   COLORURINE YELLOW 01/02/2024 1530   APPEARANCEUR HAZY (A) 01/02/2024 1530   LABSPEC 1.019 01/02/2024 1530   PHURINE 6.0 01/02/2024 1530   GLUCOSEU NEGATIVE 01/02/2024 1530   HGBUR NEGATIVE 01/02/2024 1530   BILIRUBINUR  NEGATIVE 01/02/2024 1530   KETONESUR NEGATIVE 01/02/2024 1530   PROTEINUR NEGATIVE 01/02/2024 1530   UROBILINOGEN 0.2 08/21/2013 0017   NITRITE NEGATIVE 01/02/2024 1530   LEUKOCYTESUR TRACE (A) 01/02/2024 1530   Sepsis Labs Recent Labs  Lab 01/02/24 1332 01/03/24 0453 01/04/24 0459 01/06/24 0501  WBC 7.7 7.2 6.9 5.4   Microbiology Recent Results (from the past 240 hours)  Resp panel by RT-PCR (RSV, Flu A&B, Covid) Anterior Nasal Swab     Status: None   Collection Time: 01/02/24  1:33 PM   Specimen: Anterior Nasal Swab  Result Value Ref Range Status   SARS Coronavirus 2 by RT PCR NEGATIVE NEGATIVE Final    Comment: (NOTE) SARS-CoV-2 target nucleic acids are NOT DETECTED.  The SARS-CoV-2 RNA is generally detectable in upper respiratory specimens during the acute phase of infection. The lowest concentration of SARS-CoV-2 viral copies this assay can detect is 138 copies/mL. A negative result does not preclude SARS-Cov-2 infection and should not be used as the sole basis for treatment or other patient management decisions. A negative result may occur with  improper specimen collection/handling, submission of specimen other than nasopharyngeal swab, presence of viral mutation(s) within the areas targeted by this assay, and inadequate number of viral copies(<138 copies/mL). A negative result must be combined with clinical observations, patient history, and epidemiological information. The expected result is Negative.  Fact Sheet for Patients:  bloggercourse.com  Fact Sheet for Healthcare Providers:  seriousbroker.it  This test is no t yet approved or cleared by the [place]  FDA and  has been  authorized for detection and/or diagnosis of SARS-CoV-2 by FDA under an Emergency Use Authorization (EUA). This EUA will remain  in effect (meaning this test can be used) for the duration of the COVID-19 declaration under Section  564(b)(1) of the Act, 21 U.S.C.section 360bbb-3(b)(1), unless the authorization is terminated  or revoked sooner.       Influenza A by PCR NEGATIVE NEGATIVE Final   Influenza B by PCR NEGATIVE NEGATIVE Final    Comment: (NOTE) The Xpert Xpress SARS-CoV-2/FLU/RSV plus assay is intended as an aid in the diagnosis of influenza from Nasopharyngeal swab specimens and should not be used as a sole basis for treatment. Nasal washings and aspirates are unacceptable for Xpert Xpress SARS-CoV-2/FLU/RSV testing.  Fact Sheet for Patients: bloggercourse.com  Fact Sheet for Healthcare Providers: seriousbroker.it  This test is not yet approved or cleared by the [place]  FDA and has been authorized for detection and/or diagnosis of SARS-CoV-2 by FDA under an Emergency Use Authorization (EUA). This EUA will remain in effect (meaning this test can be used) for the duration of the COVID-19 declaration under Section 564(b)(1) of the Act, 21 U.S.C. section 360bbb-3(b)(1), unless the authorization is terminated or revoked.     Resp Syncytial Virus by PCR NEGATIVE NEGATIVE Final    Comment: (NOTE) Fact Sheet for Patients: bloggercourse.com  Fact Sheet for Healthcare Providers: seriousbroker.it  This test is not yet approved or cleared by the [place]  FDA and has been authorized for detection and/or diagnosis of SARS-CoV-2 by FDA under an Emergency Use Authorization (EUA). This EUA will remain in effect (meaning this test can be used) for the duration of the COVID-19 declaration under Section 564(b)(1) of the Act, 21 U.S.C. section 360bbb-3(b)(1), unless the authorization is terminated or revoked.  Performed at [facility], [address] [place], [place] [address]     Procedures/Studies: CT ABDOMEN PELVIS WO CONTRAST Result Date: 01/05/2024 CLINICAL DATA:   History of recurrent hepatic encephalopathy with abdominal pain and altered mental status EXAM: CT ABDOMEN AND PELVIS WITHOUT CONTRAST TECHNIQUE: Multidetector CT imaging of the abdomen and pelvis was performed following the standard protocol without IV contrast. RADIATION DOSE REDUCTION: This exam was performed according to the departmental dose-optimization program which includes automated exposure control, adjustment of the mA and/or kV according to patient size and/or use of iterative reconstruction technique. COMPARISON:  CT abdomen and pelvis dated 10/29/2023 FINDINGS: Lower chest: Lingular basilar perifissural nodules measuring up to 5 mm (4:26, 27). No pleural effusion or pneumothorax demonstrated. Partially imaged heart size is normal. Coronary artery calcifications. Hepatobiliary: Cirrhotic morphology. No focal hepatic lesions. No intra or extrahepatic biliary ductal dilation. Cholecystectomy. Pancreas: No focal lesions or main ductal dilation. Spleen: Unchanged splenomegaly measures 14.6 cm in AP dimension. Adrenals/Urinary Tract: No adrenal nodules. No suspicious renal masses by noncontrast technique. Simple exophytic anterior left upper pole cyst measures 2.3 cm. No hydronephrosis. Bilateral nonobstructing stones measuring up to 4 mm in the upper pole right kidney. Urinary bladder is decompressed. Stomach/Bowel: Normal appearance of the stomach. No evidence of bowel wall thickening, distention, or inflammatory changes. Appendix is not discretely seen. Vascular/Lymphatic: Aortic atherosclerosis. Prominent retroperitoneal varices. Partially imaged superficial varicosity in the anterior proximal left thigh. No enlarged abdominal or pelvic lymph nodes. Reproductive: Multiple coarsely calcified uterine masses, likely leiomyomata. Other: No free fluid, fluid collection, or free air. Musculoskeletal: No acute or abnormal lytic or blastic osseous lesions. Multilevel degenerative changes of the partially imaged  thoracic and lumbar spine. Unchanged compression deformity  of T12 status post vertebral augmentation. Small fat and ascites containing left lower anterior abdominal hernia (2:72). Calcified injection granulomata in the anterior abdominal wall. Bilateral flank body wall edema. IMPRESSION: 1. No acute abdominopelvic findings. 2. Cirrhosis with sequela of portal hypertension including splenomegaly and prominent retroperitoneal varices. 3. Bilateral nonobstructing renal stones measuring up to 4 mm in the upper pole right kidney. 4. Small fat and ascites containing left lower anterior abdominal hernia. 5. Lingular basilar perifissural nodules measuring up to 5 mm may be infectious/inflammatory. 6.  Aortic Atherosclerosis (ICD10-I70.0). Electronically Signed   By: Limin  Xu M.D.   On: 01/05/2024 16:51   DG Chest 2 View Result Date: 01/02/2024 CLINICAL DATA:  Cough EXAM: CHEST - 2 VIEW COMPARISON:  12/20/2023, 10/29/2023 FINDINGS: Mild cardiomegaly and aortic atherosclerosis. No focal opacity, pleural effusion, or pneumothorax IMPRESSION: No active cardiopulmonary disease. Mild cardiomegaly. Electronically Signed   By: Luke Bun M.D.   On: 01/02/2024 16:11   DG Foot 2 Views Right Result Date: 12/22/2023 EXAM: 1 or 2 VIEW(S) XRAY OF THE RIGHT FOOT 12/22/2023 01:59:00 PM COMPARISON: None available. CLINICAL HISTORY: Injury of right great toe. FINDINGS: BONES AND JOINTS: There is profound bone density loss. This limits assessment for nondisplaced fractures but no displaced acute foot fracture is seen. There is a healed fracture deformity of the third toe proximal phalanx. There are mild degenerative changes in the forefoot but no erosive arthropathy. There is a prominent noninflammatory plantar calcaneal spur. Mild midfoot arthrosis. SOFT TISSUES: There is mild generalized soft tissue edema. Vascular calcifications extending into the base of the toes, heavy calcification in the anterior and posterior tibial  arteries in the distal foreleg. IMPRESSION: 1. No displaced acute foot fracture. Assessment for nondisplaced fractures is limited by profound bone density loss. 2. Healed fracture deformity of the third toe proximal phalanx. 3. Vascular calcifications extending into the base of the toes with heavy calcification in the anterior and posterior tibial arteries in the distal foreleg. Electronically signed by: Francis Quam MD 12/22/2023 09:12 PM EST RP Workstation: [id number]   CT Head Wo Contrast Result Date: 12/20/2023 CLINICAL DATA:  L2 altered mental status. EXAM: CT HEAD WITHOUT CONTRAST TECHNIQUE: Contiguous axial images were obtained from the base of the skull through the vertex without intravenous contrast. RADIATION DOSE REDUCTION: This exam was performed according to the departmental dose-optimization program which includes automated exposure control, adjustment of the mA and/or kV according to patient size and/or use of iterative reconstruction technique. COMPARISON:  December 14, 2023 FINDINGS: Brain: There is generalized cerebral atrophy with widening of the extra-axial spaces and ventricular dilatation. There are areas of decreased attenuation within the white matter tracts of the supratentorial brain, consistent with microvascular disease changes. Vascular: Marked severity calcification of the bilateral cavernous carotid arteries, the visualized portion of the left vertebral artery and proximal portion of the basilar artery is noted. Skull: Normal. Negative for fracture or focal lesion. Sinuses/Orbits: No acute finding. Other: None. IMPRESSION: 1. Generalized cerebral atrophy with chronic white matter small vessel ischemic changes. 2. No acute intracranial abnormality. Electronically Signed   By: Suzen Dials M.D.   On: 12/20/2023 13:27   DG Chest Portable 1 View Result Date: 12/20/2023 CLINICAL DATA:  Altered mental status. EXAM: PORTABLE CHEST 1 VIEW COMPARISON:  December 14, 2023 FINDINGS:  The cardiac silhouette is mildly enlarged and unchanged in size. There is marked severity calcification of the aortic arch. No acute infiltrate, pleural effusion or pneumothorax is identified. The visualized skeletal structures  are unremarkable. IMPRESSION: Stable cardiomegaly without acute or active cardiopulmonary disease. Electronically Signed   By: Suzen Dials M.D.   On: 12/20/2023 13:23   CT Head Wo Contrast Result Date: 12/14/2023 EXAM: CT HEAD WITHOUT CONTRAST 12/14/2023 04:53:00 PM TECHNIQUE: CT of the head was performed without the administration of intravenous contrast. Automated exposure control, iterative reconstruction, and/or weight based adjustment of the mA/kV was utilized to reduce the radiation dose to as low as reasonably achievable. COMPARISON: Head CT 10/29/2023 and MRI 10/17/2022. CLINICAL HISTORY: Mental status change, unknown cause. FINDINGS: BRAIN AND VENTRICLES: There is no evidence of an acute infarct, intracranial hemorrhage, mass, midline shift, hydrocephalus, or extra-axial fluid collection. Cerebral volume is within normal limits for age. Patchy hypodensities in the cerebral white matter are similar to the prior CT and nonspecific but compatible with moderate chronic small vessel ischemic disease. Calcified atherosclerosis at the skull base. ORBITS: Left cataract extraction. SINUSES: No acute abnormality. SOFT TISSUES AND SKULL: No acute soft tissue abnormality. No skull fracture. IMPRESSION: 1. No acute intracranial abnormality. 2. Moderate chronic small vessel ischemic disease. Electronically signed by: Dasie Hamburg MD 12/14/2023 05:45 PM EST RP Workstation: [id number]   DG Chest Portable 1 View Result Date: 12/14/2023 CLINICAL DATA:  Altered mental status EXAM: PORTABLE CHEST 1 VIEW COMPARISON:  Prior chest x-ray 10/29/2023 FINDINGS: The heart size and mediastinal contours are within normal limits. Both lungs are clear save for mild chronic bronchitic changes. The  visualized skeletal structures are unremarkable. Atherosclerotic vascular calcifications present in the transverse aorta. IMPRESSION: No active disease. Electronically Signed   By: Wilkie Lent M.D.   On: 12/14/2023 15:39     Time coordinating discharge: Over 30 minutes    Alm Apo, MD  [facility] 01/06/2024, 12:07 PM    [1]  Allergies Allergen Reactions   Ace Inhibitors Anaphylaxis and Swelling   Gadolinium Derivatives Anaphylaxis   Ivp Dye [Iodinated Contrast Media] Anaphylaxis   Wound Dressing Adhesive Dermatitis   Desitin [Zinc  Oxide] Rash and Other (See Comments)    Worsens rash   Nsaids Other (See Comments)    Contraindication due to CKD    Butrans [Buprenorphine] Other (See Comments)    Discontinued due to liver function   Yellow Jacket Venom Swelling   Hibiclens  [Chlorhexidine  Gluconate] Rash and Other (See Comments)    Worsens rash   Penicillins Dermatitis and Rash   Vibramycin [Doxycycline] Rash   Zofran  [Ondansetron ] Rash   "

## 2024-01-06 NOTE — Progress Notes (Signed)
 Assessment unchanged. Pt and son verbalized understanding of dc instructions through teach back including medications to resume and follow up care. Questions answered. Discharged via wc to front entrance accompanied by son and NT.

## 2024-01-06 NOTE — Plan of Care (Signed)

## 2024-01-07 ENCOUNTER — Other Ambulatory Visit: Payer: Self-pay

## 2024-01-07 ENCOUNTER — Emergency Department (HOSPITAL_COMMUNITY)
Admission: EM | Admit: 2024-01-07 | Discharge: 2024-01-07 | Disposition: A | Discharge status: Home / Self Care | Payer: Medicare (Managed Care) | Source: Home / Self Care | Attending: Emergency Medicine | Admitting: Emergency Medicine

## 2024-01-07 DIAGNOSIS — R410 Disorientation, unspecified: Secondary | ICD-10-CM | POA: Insufficient documentation

## 2024-01-07 DIAGNOSIS — I129 Hypertensive chronic kidney disease with stage 1 through stage 4 chronic kidney disease, or unspecified chronic kidney disease: Secondary | ICD-10-CM | POA: Insufficient documentation

## 2024-01-07 DIAGNOSIS — R4182 Altered mental status, unspecified: Secondary | ICD-10-CM | POA: Insufficient documentation

## 2024-01-07 DIAGNOSIS — Z87891 Personal history of nicotine dependence: Secondary | ICD-10-CM | POA: Insufficient documentation

## 2024-01-07 DIAGNOSIS — E039 Hypothyroidism, unspecified: Secondary | ICD-10-CM | POA: Insufficient documentation

## 2024-01-07 DIAGNOSIS — N1832 Chronic kidney disease, stage 3b: Secondary | ICD-10-CM | POA: Insufficient documentation

## 2024-01-07 DIAGNOSIS — Z79899 Other long term (current) drug therapy: Secondary | ICD-10-CM | POA: Insufficient documentation

## 2024-01-07 LAB — BLOOD GAS, VENOUS
Acid-base deficit: 7.9 mmol/L — ABNORMAL HIGH (ref 0.0–2.0)
Bicarbonate: 16.9 mmol/L — ABNORMAL LOW (ref 20.0–28.0)
O2 Saturation: 95.4 %
Patient temperature: 37
pCO2, Ven: 32 mmHg — ABNORMAL LOW (ref 44–60)
pH, Ven: 7.33 (ref 7.25–7.43)
pO2, Ven: 71 mmHg — ABNORMAL HIGH (ref 32–45)

## 2024-01-07 LAB — ETHANOL: Alcohol, Ethyl (B): <15 mg/dL

## 2024-01-07 LAB — COMPREHENSIVE METABOLIC PANEL WITH GFR
ALT: 20 U/L (ref 0–44)
AST: 33 U/L (ref 15–41)
Albumin: 3.3 g/dL — ABNORMAL LOW (ref 3.5–5.0)
Alkaline Phosphatase: 145 U/L — ABNORMAL HIGH (ref 38–126)
Anion gap: 13 (ref 5–15)
BUN: 22 mg/dL (ref 8–23)
CO2: 16 mmol/L — ABNORMAL LOW (ref 22–32)
Calcium: 9.1 mg/dL (ref 8.9–10.3)
Chloride: 108 mmol/L (ref 98–111)
Creatinine, Ser: 1.3 mg/dL — ABNORMAL HIGH (ref 0.44–1.00)
GFR, Estimated: 45 mL/min — ABNORMAL LOW
Glucose, Bld: 142 mg/dL — ABNORMAL HIGH (ref 70–99)
Potassium: 3.9 mmol/L (ref 3.5–5.1)
Sodium: 136 mmol/L (ref 135–145)
Total Bilirubin: 1.4 mg/dL — ABNORMAL HIGH (ref 0.0–1.2)
Total Protein: 6.2 g/dL — ABNORMAL LOW (ref 6.5–8.1)

## 2024-01-07 LAB — CBC WITH DIFFERENTIAL/PLATELET
Abs Immature Granulocytes: 0.01 K/uL (ref 0.00–0.07)
Basophils Absolute: 0 K/uL (ref 0.0–0.1)
Basophils Relative: 1 %
Eosinophils Absolute: 0.9 K/uL — ABNORMAL HIGH (ref 0.0–0.5)
Eosinophils Relative: 14 %
HCT: 38.8 % (ref 36.0–46.0)
Hemoglobin: 12.2 g/dL (ref 12.0–15.0)
Immature Granulocytes: 0 %
Lymphocytes Relative: 22 %
Lymphs Abs: 1.5 K/uL (ref 0.7–4.0)
MCH: 31 pg (ref 26.0–34.0)
MCHC: 31.4 g/dL (ref 30.0–36.0)
MCV: 98.7 fL (ref 80.0–100.0)
Monocytes Absolute: 0.5 K/uL (ref 0.1–1.0)
Monocytes Relative: 7 %
Neutro Abs: 3.9 K/uL (ref 1.7–7.7)
Neutrophils Relative %: 56 %
Platelets: 149 K/uL — ABNORMAL LOW (ref 150–400)
RBC: 3.93 MIL/uL (ref 3.87–5.11)
RDW: 14.7 % (ref 11.5–15.5)
WBC: 6.8 K/uL (ref 4.0–10.5)
nRBC: 0 % (ref 0.0–0.2)

## 2024-01-07 LAB — URINALYSIS, ROUTINE W REFLEX MICROSCOPIC
Bacteria, UA: NONE SEEN
Bilirubin Urine: NEGATIVE
Glucose, UA: NEGATIVE mg/dL
Ketones, ur: NEGATIVE mg/dL
Nitrite: NEGATIVE
Protein, ur: NEGATIVE mg/dL
Specific Gravity, Urine: 1.023 (ref 1.005–1.030)
pH: 6 (ref 5.0–8.0)

## 2024-01-07 LAB — URINE DRUG SCREEN
Amphetamines: NEGATIVE
Barbiturates: NEGATIVE
Benzodiazepines: NEGATIVE
Cocaine: NEGATIVE
Fentanyl: NEGATIVE
Methadone Scn, Ur: NEGATIVE
Opiates: POSITIVE — AB
Tetrahydrocannabinol: NEGATIVE

## 2024-01-07 LAB — AMMONIA: Ammonia: 127 umol/L — ABNORMAL HIGH (ref 9–35)

## 2024-01-07 NOTE — ED Triage Notes (Signed)
 PT arrives to triage accompanied buy family. Pt reports that she feels out of it that began this morning. PT was discharged from the hospital yesterday due to hepatic encephalopathy. Family reports the pt did not receive her venlafaxine  while admitted.

## 2024-01-07 NOTE — Discharge Instructions (Addendum)
 While you were in the emergency room, you had blood work done that overall was at your baseline.  Resume taking your venlafaxine .  Take your lactulose  at usual dosing.  Continue to take all other medications as prescribed.  Follow-up with your primary care doctor at your appointment on the third.  Return to the emergency room for any new or worsening symptoms.

## 2024-01-07 NOTE — ED Provider Notes (Signed)
 " [facility] EMERGENCY DEPARTMENT AT [facility] Provider Note  CSN: [id number] Arrival date & time: 01/07/24 1040  Chief Complaint(s) Altered Mental Status  HPI [name] is a 68 y.o. female here today with her son who is her primary caretaker.  She has multiple medical comorbidities including [name], hepatic encephalopathy, depression, short gut syndrome with chronic diarrhea, stage IIIb CKD hypothyroidism, chronic hypotension.  She was recently discharged yesterday after being managed for hepatic encephalopathy.  She is here today with her son reports that she has had labile moods over the last few days.  He is concerned because patient not been receiving her venlafaxine  while admitted.   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   Hyperammonemia 09/30/2022   Hypertension    Kidney stone    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 coli UTI (urinary tract infection) 12/27/2023   Chronic back pain 12/27/2023   Staphylococcus epidermidis bacteremia 11/02/2023   Normal anion gap metabolic acidosis 10/24/2023   AMS (altered mental status) 10/08/2023   Arthralgia of left knee 09/08/2023   Acute gout 08/28/2023   Knee pain 07/03/2023   Hepatic  encephalopathy (HCC) 06/29/2023   Chronic kidney disease, stage 3a (HCC) 06/28/2023   Hyperammonemia 05/23/2023   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) 05/23/2023   Chronic idiopathic thrombocytopenia (HCC) 05/23/2023   Hyperlipidemia 05/23/2023   Generalized anxiety disorder 05/23/2023   Chronic anemia 05/23/2023   Acute metabolic encephalopathy 05/23/2023   Copper  deficiency 04/24/2023   Depression 03/30/2023   Restless leg syndrome 03/30/2023   Pruritus 03/06/2023   Edema of both lower legs 03/06/2023   Insomnia 03/06/2023   Compression fracture of body of thoracic vertebra (HCC) 03/06/2023   Anemia of chronic disease 02/22/2023   Lumbar radiculopathy 12/10/2022   Grade I diastolic dysfunction 12/10/2022   History of DVT (deep vein thrombosis) 12/01/2022   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   Acute hepatic encephalopathy (HCC) 10/17/2022   DNR (do not resuscitate)/DNI(Do Not intubate) 10/05/2022   Vaginal bleeding 10/05/2022   Physical deconditioning 08/17/2022   Decompensated cirrhosis (HCC) 08/17/2022   Osteoarthritis of right knee 08/01/2022   Folate deficiency 06/14/2022   Systolic murmur 06/12/2022   Thrombocytopenia (HCC) - Baseline 100-130K 05/16/2022   History of eye surgery 05/01/2022   Corneal abnormality 04/29/2022   Status post eye surgery 04/29/2022   Corneal perforation of left eye 04/18/2022   Pseudophakia of left eye 02/08/2022   Physical debility 12/10/2021   Chronic hypotension 11/24/2021   Liver cirrhosis secondary to NASH (HCC) 10/03/2021   Polypharmacy 06/22/2021   Former smoker 01/06/2021   Combined form of age-related cataract, right eye 10/01/2020  Corneal scarring 09/10/2020   Cerebral atherosclerosis 08/20/2020   Short gut syndrome 08/20/2020   DDD (degenerative disc disease), lumbar 08/20/2020   Hernia, ventral 03/06/2020   Splenomegaly 02/06/2019   Vitamin B12 deficiency 01/21/2019   GERD (gastroesophageal reflux disease)  07/25/2016   Hypothyroidism 07/25/2016   Normocytic anemia 05/09/2016   Microcytic anemia 04/12/2016   Vitamin D  deficiency 05/04/2015   History of adenomatous polyp of colon 04/28/2015   H/O angioedema 11/15/2014   Class 2 obesity due to excess calories with body mass index (BMI) of 38.0 to 38.9 in adult 07/25/2014   Chronic diarrhea 06/01/2014   Home Medication(s) Prior to Admission medications  Medication Sig Start Date End Date Taking? Authorizing Provider  acetaminophen  (TYLENOL ) 500 MG tablet Take 500-1,000 mg by mouth every 6 (six) hours as needed for headache or fever (pain).    [provider]  albuterol  (VENTOLIN  HFA) 108 (90 Base) MCG/ACT inhaler Inhale 2 puffs into the lungs every 6 (six) hours as needed for wheezing or shortness of breath. 11/13/23   [name], MD  atorvastatin  (LIPITOR) 40 MG tablet TAKE 1 TABLET(40 MG) BY MOUTH IN THE MORNING Patient taking differently: Take 40 mg by mouth in the morning. 09/18/23   [name], MD  colchicine  0.6 MG tablet Take 0.6 mg by mouth 2 (two) times daily as needed (as directed for gout flares).    [provider]  copper  tablet Take 2 mg by mouth in the morning and at bedtime.    [provider]  cyanocobalamin  (VITAMIN B12) 1000 MCG tablet Take 2,000 mcg by mouth every Monday, Wednesday, and Friday.    [provider]  dicyclomine  (BENTYL ) 10 MG capsule Take 1 capsule (10 mg total) by mouth 3 (three) times daily as needed for spasms (abdominal cramping). 08/07/23   [name], MD  EPINEPHrine  0.3 mg/0.3 mL IJ SOAJ injection Inject 0.3 mg into the muscle as needed for anaphylaxis. 07/27/23   [name], MD  famotidine  (PEPCID ) 20 MG tablet Take 20 mg by mouth daily as needed for heartburn or indigestion.    [provider]  furosemide  (LASIX ) 40 MG tablet Take 1 tablet (40 mg total) by mouth daily. 12/18/23   Jillian Buttery, MD  HYDROcodone -acetaminophen  (NORCO) 10-325 MG  tablet Take 1 tablet by mouth 3 (three) times daily as needed (for pain- may take up to a sum total of 3 tablets/24 hours). 06/15/23   [provider]  IRON -VITAMIN C  PO Take 1 tablet by mouth daily.    [provider]  lactulose  (CHRONULAC ) 10 GM/15ML solution Take 45 mLs (30 g total) by mouth 3 (three) times daily. 12/28/23   Barbarann Nest, MD  levothyroxine  (SYNTHROID ) 175 MCG tablet Take 1 tablet (175 mcg total) by mouth daily before breakfast. 12/21/23   [name], MD  magnesium  gluconate (MAGONATE) 500 (27 Mg) MG TABS tablet Take 1 tablet (500 mg total) by mouth in the morning and at bedtime. Patient taking differently: Take 1,000 mg by mouth in the morning. 05/01/23   [name], MD  meclizine  (ANTIVERT ) 25 MG tablet Take 1 tablet (25 mg total) by mouth 3 (three) times daily as needed for dizziness. 12/18/23   Jillian Buttery, MD  megestrol  (MEGACE ) 40 MG tablet Take 1 tablet (40 mg total) by mouth 2 (two) times daily. 05/04/23   Ajewole, [name], MD  methocarbamol  (ROBAXIN ) 500 MG tablet Take 500 mg by mouth in the morning.    [provider]  midodrine  (  PROAMATINE ) 10 MG tablet Take 10 mg by mouth 3 (three) times daily.    [provider]  Multiple Vitamins-Minerals (CENTRUM SILVER  ULTRA WOMENS PO) Take 1 tablet by mouth daily with breakfast.    [provider]  naloxone (NARCAN) nasal spray 4 mg/0.1 mL Place 1 spray into the nose as needed (accidental overdose). 03/25/22   [provider]  NUCYNTA  ER 200 MG TB12 Take 200 mg by mouth every 12 (twelve) hours as needed (for pain).    [provider]  nystatin  cream (MYCOSTATIN ) Apply 1 Application topically 2 (two) times daily as needed for dry skin. Patient taking differently: Apply 1 Application topically 2 (two) times daily as needed (for irritation). 05/29/23   Cheryle Page, MD  pantoprazole  (PROTONIX ) 40 MG tablet TAKE 1 TABLET(40 MG) BY MOUTH TWICE DAILY BEFORE A  MEAL Patient taking differently: Take 40 mg by mouth 2 (two) times daily before a meal. 12/21/23   [name], MD  potassium chloride  (KLOR-CON ) 10 MEQ tablet Take 2 tablets (20 mEq total) by mouth 2 (two) times daily. 08/07/23   [name], MD  rifaximin  (XIFAXAN ) 550 MG TABS tablet Take 550 mg by mouth in the morning and at bedtime.    [provider]  rOPINIRole  (REQUIP ) 0.25 MG tablet TAKE 1 TABLET(0.25 MG) BY MOUTH AT BEDTIME 06/10/23   [name], MD  senna-docusate (SENOKOT-S) 8.6-50 MG tablet Take 1 tablet by mouth 2 (two) times daily. 11/30/23   Duanne Butler DASEN, MD  sodium bicarbonate  650 MG tablet Take 1 tablet (650 mg total) by mouth 2 (two) times daily. 12/28/23   Barbarann Nest, MD  spironolactone  (ALDACTONE ) 50 MG tablet Take 1 tablet (50 mg total) by mouth daily. 11/13/23   [name], MD  triamcinolone  cream (KENALOG ) 0.1 % Apply 1 Application topically 2 (two) times daily as needed (skin irritation).    [provider]  venlafaxine  XR (EFFEXOR -XR) 75 MG 24 hr capsule Take 1 capsule (75 mg total) by mouth daily with breakfast. 12/13/23   Duanne Butler DASEN, MD                                                                                                                                    Past Surgical History Past Surgical History:  Procedure Laterality Date   ABDOMINAL WALL MESH  REMOVAL     2016   CESAREAN SECTION WITH BILATERAL TUBAL LIGATION  04/30/1985   CHOLECYSTECTOMY  05/2007   Exploratory laparotomy, lysis of adhesions, takedown of ileostomy with small bowel resection, open cholecystectomy and ileocolostomy   COLON SURGERY  01/2007   exploratory laparotomy with right colecotmy and end ileostomy   COLONOSCOPY  04/02/2019   Dr Gunnar Chris Daniels, MD [facility]   ESOPHAGOGASTRODUODENOSCOPY (EGD) WITH PROPOFOL  N/A 10/19/2022   Procedure: ESOPHAGOGASTRODUODENOSCOPY (EGD) WITH PROPOFOL ;  Surgeon: Saintclair Jasper, MD;  Location: [facility];   Service: Gastroenterology;  Laterality:  N/A;   ESOPHAGOSCOPY  04/02/2019   EYE SURGERY Left 10/06/2020   Cataract extraciton w/intraocular lens implant- Dr Caresse   HERNIA REPAIR  10/2007   ILEOSTOMY  2009   states bowel perfotation wih colonsocopy   ILEOSTOMY CLOSURE  2009   INCISIONAL HERNIA REPAIR  10/11/2018   IR KYPHO THORACIC WITH BONE BIOPSY  08/31/2022   IR KYPHO THORACIC WITH BONE BIOPSY  10/27/2022   IR RADIOLOGIST EVAL & MGMT  09/27/2022   KIDNEY STONE SURGERY     LIVER RESECTION     2009   PANNICULECTOMY     repair of abdominal wall hernia   WRIST SURGERY     tendonitis   Family History Family History  Problem Relation Age of Onset   Uterine cancer Mother    Bone cancer Father    Heart disease Father    Hyperlipidemia Father    Dementia Father    Hypertension Father    Skin cancer Sister    Hypertension Sister    Diabetes Sister    Hyperlipidemia Brother    Hypertension Brother    Heart disease Brother     Social History Social History[1] Allergies Ace inhibitors, Gadolinium derivatives, Ivp dye [iodinated contrast media], Wound dressing adhesive, Desitin [zinc  oxide], Nsaids, Butrans [buprenorphine], Yellow jacket venom, Hibiclens  [chlorhexidine  gluconate], Penicillins, Vibramycin [doxycycline], and Zofran  [ondansetron ]  Review of Systems Review of Systems  Physical Exam Vital Signs  I have reviewed the triage vital signs BP (!) 157/92   Pulse 84   Temp 98.1 F (36.7 C) (Oral)   Resp 16   SpO2 100%   Physical Exam Vitals and nursing note reviewed.  HENT:     Head: Normocephalic.  Eyes:     Pupils: Pupils are equal, round, and reactive to light.  Cardiovascular:     Rate and Rhythm: Normal rate.  Pulmonary:     Effort: Pulmonary effort is normal.  Abdominal:     General: Abdomen is flat.     Palpations: Abdomen is soft.  Musculoskeletal:        General: Normal range of motion.     Cervical back: Normal range of motion.  Skin:     General: Skin is warm and dry.  Neurological:     General: No focal deficit present.     Mental Status: She is alert.     Comments: No asterixis.  Alert and oriented x 3.  Conversant, pleasant     ED Results and Treatments Labs (all labs ordered are listed, but only abnormal results are displayed) Labs Reviewed  COMPREHENSIVE METABOLIC PANEL WITH GFR - Abnormal; Notable for the following components:      Result Value   CO2 16 (*)    Glucose, Bld 142 (*)    Creatinine, Ser 1.30 (*)    Total Protein 6.2 (*)    Albumin  3.3 (*)    Alkaline Phosphatase 145 (*)    Total Bilirubin 1.4 (*)    GFR, Estimated 45 (*)    All other components within normal limits  CBC WITH DIFFERENTIAL/PLATELET - Abnormal; Notable for the following components:   Platelets 149 (*)    Eosinophils Absolute 0.9 (*)    All other components within normal limits  URINALYSIS, ROUTINE W REFLEX MICROSCOPIC - Abnormal; Notable for the following components:   Color, Urine AMBER (*)    APPearance HAZY (*)    Hgb urine dipstick MODERATE (*)    Leukocytes,Ua SMALL (*)    All other  components within normal limits  AMMONIA - Abnormal; Notable for the following components:   Ammonia 127 (*)    All other components within normal limits  URINE DRUG SCREEN - Abnormal; Notable for the following components:   Opiates POSITIVE (*)    All other components within normal limits  BLOOD GAS, VENOUS - Abnormal; Notable for the following components:   pCO2, Ven 32 (*)    pO2, Ven 71 (*)    Bicarbonate 16.9 (*)    Acid-base deficit 7.9 (*)    All other components within normal limits  ETHANOL                                                                                                                          Radiology No results found.  Pertinent labs & imaging results that were available during my care of the patient were reviewed by me and considered in my medical decision making (see MDM for details).  Medications  Ordered in ED Medications - No data to display                                                                                                                                   Procedures Procedures  (including critical care time)  Medical Decision Making / ED Course   This patient presents to the ED for concern of labile moods, intermittent episodes of being out of it, this involves an extensive number of treatment options, and is a complaint that carries with it a high risk of complications and morbidity.  The differential diagnosis includes hepatic encephalopathy, waxing and waning confusion, medication noncompliance, fatigue.  MDM: On exam, patient overall looks well.  She has no signs or symptoms of hepatic encephalopathy.  She has no asterixis, does not appear confused.  Son seems primarily concerned with the patient missing her venlafaxine  earlier in the day.  He is her primary caregiver, is very in tune with her current medications, and does a good job of making sure she gets what is appropriate.  He also does not believe that the patient is experiencing encephalopathy.  Patient's elevated ammonia level does not clinically match her exam.  We discussed medication management for the patient which included resuming her venlafaxine  at her standard dosing.  We also discussed continuing her on her lactulose .  They are going to follow-up with the PCP on the third.  Patient  and patient's son had all questions answered, were pleased with discharge.   Additional history obtained: -Additional history obtained from recent discharge summary -External records from outside source obtained and reviewed including: Chart review including previous notes, labs, imaging, consultation notes   Lab Tests: -I ordered, reviewed, and interpreted labs.   The pertinent results include:   Labs Reviewed  COMPREHENSIVE METABOLIC PANEL WITH GFR - Abnormal; Notable for the following components:      Result  Value   CO2 16 (*)    Glucose, Bld 142 (*)    Creatinine, Ser 1.30 (*)    Total Protein 6.2 (*)    Albumin  3.3 (*)    Alkaline Phosphatase 145 (*)    Total Bilirubin 1.4 (*)    GFR, Estimated 45 (*)    All other components within normal limits  CBC WITH DIFFERENTIAL/PLATELET - Abnormal; Notable for the following components:   Platelets 149 (*)    Eosinophils Absolute 0.9 (*)    All other components within normal limits  URINALYSIS, ROUTINE W REFLEX MICROSCOPIC - Abnormal; Notable for the following components:   Color, Urine AMBER (*)    APPearance HAZY (*)    Hgb urine dipstick MODERATE (*)    Leukocytes,Ua SMALL (*)    All other components within normal limits  AMMONIA - Abnormal; Notable for the following components:   Ammonia 127 (*)    All other components within normal limits  URINE DRUG SCREEN - Abnormal; Notable for the following components:   Opiates POSITIVE (*)    All other components within normal limits  BLOOD GAS, VENOUS - Abnormal; Notable for the following components:   pCO2, Ven 32 (*)    pO2, Ven 71 (*)    Bicarbonate 16.9 (*)    Acid-base deficit 7.9 (*)    All other components within normal limits  ETHANOL     Medicines ordered and prescription drug management: No orders of the defined types were placed in this encounter.   -I have reviewed the patients home medicines and have made adjustments as needed   Cardiac Monitoring: The patient was maintained on a cardiac monitor.  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   pt stated on admission history review   Corneal perforation of left eye 04/18/2022   Depression, major, recurrent, moderate (HCC) 09/10/2018   DVT (deep venous thrombosis) (HCC) 01/10/1997   left      left     Heart murmur    Hepatic encephalopathy (HCC) 12/10/2022   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   Hyperammonemia 09/30/2022   Hypertension    Kidney stone    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     Final Clinical Impression(s) / ED Diagnoses Final diagnoses:  Confusion     @PCDICTATION @     [1]  Social History Tobacco Use   Smoking status: Former    Current packs/day: 0.00    Types: Cigarettes    Quit date: 1987    Years since quitting: 39.0    Passive exposure: Past   Smokeless tobacco: Never  Vaping Use   Vaping status: Never Used  Substance Use Topics   Alcohol use: No   Drug use: No     Mannie Pac T, DO 01/07/24 2223  "

## 2024-01-08 ENCOUNTER — Telehealth: Payer: Self-pay | Admitting: *Deleted

## 2024-01-08 ENCOUNTER — Inpatient Hospital Stay (HOSPITAL_COMMUNITY)
Admission: EM | Admit: 2024-01-08 | Discharge: 2024-01-13 | DRG: 442 | Disposition: A | Discharge status: Home Health | Payer: Medicare (Managed Care) | Attending: Internal Medicine | Admitting: Internal Medicine

## 2024-01-08 ENCOUNTER — Encounter (HOSPITAL_COMMUNITY): Payer: Self-pay

## 2024-01-08 ENCOUNTER — Emergency Department (HOSPITAL_COMMUNITY): Payer: Medicare (Managed Care)

## 2024-01-08 ENCOUNTER — Other Ambulatory Visit: Payer: Self-pay

## 2024-01-08 DIAGNOSIS — Z880 Allergy status to penicillin: Secondary | ICD-10-CM

## 2024-01-08 DIAGNOSIS — E7220 Disorder of urea cycle metabolism, unspecified: Secondary | ICD-10-CM | POA: Diagnosis present

## 2024-01-08 DIAGNOSIS — E785 Hyperlipidemia, unspecified: Secondary | ICD-10-CM | POA: Diagnosis present

## 2024-01-08 DIAGNOSIS — G4709 Other insomnia: Secondary | ICD-10-CM

## 2024-01-08 DIAGNOSIS — G2581 Restless legs syndrome: Secondary | ICD-10-CM | POA: Diagnosis present

## 2024-01-08 DIAGNOSIS — Z833 Family history of diabetes mellitus: Secondary | ICD-10-CM

## 2024-01-08 DIAGNOSIS — D631 Anemia in chronic kidney disease: Secondary | ICD-10-CM | POA: Diagnosis present

## 2024-01-08 DIAGNOSIS — N950 Postmenopausal bleeding: Secondary | ICD-10-CM

## 2024-01-08 DIAGNOSIS — I129 Hypertensive chronic kidney disease with stage 1 through stage 4 chronic kidney disease, or unspecified chronic kidney disease: Secondary | ICD-10-CM | POA: Diagnosis present

## 2024-01-08 DIAGNOSIS — Z886 Allergy status to analgesic agent status: Secondary | ICD-10-CM

## 2024-01-08 DIAGNOSIS — G8929 Other chronic pain: Secondary | ICD-10-CM | POA: Diagnosis present

## 2024-01-08 DIAGNOSIS — K90829 Short bowel syndrome, unspecified: Secondary | ICD-10-CM | POA: Diagnosis present

## 2024-01-08 DIAGNOSIS — K529 Noninfective gastroenteritis and colitis, unspecified: Secondary | ICD-10-CM | POA: Diagnosis present

## 2024-01-08 DIAGNOSIS — E538 Deficiency of other specified B group vitamins: Secondary | ICD-10-CM | POA: Diagnosis present

## 2024-01-08 DIAGNOSIS — Z83438 Family history of other disorder of lipoprotein metabolism and other lipidemia: Secondary | ICD-10-CM

## 2024-01-08 DIAGNOSIS — Z91041 Radiographic dye allergy status: Secondary | ICD-10-CM

## 2024-01-08 DIAGNOSIS — E860 Dehydration: Secondary | ICD-10-CM | POA: Diagnosis present

## 2024-01-08 DIAGNOSIS — M549 Dorsalgia, unspecified: Secondary | ICD-10-CM | POA: Diagnosis present

## 2024-01-08 DIAGNOSIS — Z881 Allergy status to other antibiotic agents status: Secondary | ICD-10-CM

## 2024-01-08 DIAGNOSIS — K219 Gastro-esophageal reflux disease without esophagitis: Secondary | ICD-10-CM | POA: Diagnosis present

## 2024-01-08 DIAGNOSIS — R109 Unspecified abdominal pain: Secondary | ICD-10-CM | POA: Diagnosis present

## 2024-01-08 DIAGNOSIS — Z780 Asymptomatic menopausal state: Secondary | ICD-10-CM

## 2024-01-08 DIAGNOSIS — Z8049 Family history of malignant neoplasm of other genital organs: Secondary | ICD-10-CM

## 2024-01-08 DIAGNOSIS — Z79899 Other long term (current) drug therapy: Secondary | ICD-10-CM

## 2024-01-08 DIAGNOSIS — K5900 Constipation, unspecified: Secondary | ICD-10-CM

## 2024-01-08 DIAGNOSIS — F411 Generalized anxiety disorder: Secondary | ICD-10-CM | POA: Diagnosis present

## 2024-01-08 DIAGNOSIS — Z86718 Personal history of other venous thrombosis and embolism: Secondary | ICD-10-CM

## 2024-01-08 DIAGNOSIS — K7581 Nonalcoholic steatohepatitis (NASH): Secondary | ICD-10-CM | POA: Diagnosis present

## 2024-01-08 DIAGNOSIS — E876 Hypokalemia: Secondary | ICD-10-CM | POA: Diagnosis not present

## 2024-01-08 DIAGNOSIS — Z808 Family history of malignant neoplasm of other organs or systems: Secondary | ICD-10-CM

## 2024-01-08 DIAGNOSIS — Z86711 Personal history of pulmonary embolism: Secondary | ICD-10-CM

## 2024-01-08 DIAGNOSIS — F32A Depression, unspecified: Secondary | ICD-10-CM | POA: Diagnosis present

## 2024-01-08 DIAGNOSIS — Z87891 Personal history of nicotine dependence: Secondary | ICD-10-CM

## 2024-01-08 DIAGNOSIS — K7682 Hepatic encephalopathy: Principal | ICD-10-CM | POA: Diagnosis present

## 2024-01-08 DIAGNOSIS — Z888 Allergy status to other drugs, medicaments and biological substances status: Secondary | ICD-10-CM

## 2024-01-08 DIAGNOSIS — N179 Acute kidney failure, unspecified: Secondary | ICD-10-CM | POA: Diagnosis present

## 2024-01-08 DIAGNOSIS — I9589 Other hypotension: Secondary | ICD-10-CM | POA: Diagnosis present

## 2024-01-08 DIAGNOSIS — N1832 Chronic kidney disease, stage 3b: Secondary | ICD-10-CM | POA: Diagnosis present

## 2024-01-08 DIAGNOSIS — E039 Hypothyroidism, unspecified: Secondary | ICD-10-CM | POA: Diagnosis present

## 2024-01-08 DIAGNOSIS — N1831 Chronic kidney disease, stage 3a: Secondary | ICD-10-CM | POA: Diagnosis present

## 2024-01-08 DIAGNOSIS — Z66 Do not resuscitate: Secondary | ICD-10-CM | POA: Diagnosis present

## 2024-01-08 DIAGNOSIS — D696 Thrombocytopenia, unspecified: Secondary | ICD-10-CM | POA: Diagnosis present

## 2024-01-08 DIAGNOSIS — R5381 Other malaise: Secondary | ICD-10-CM

## 2024-01-08 DIAGNOSIS — Z9049 Acquired absence of other specified parts of digestive tract: Secondary | ICD-10-CM

## 2024-01-08 DIAGNOSIS — N390 Urinary tract infection, site not specified: Secondary | ICD-10-CM | POA: Diagnosis present

## 2024-01-08 DIAGNOSIS — Z87442 Personal history of urinary calculi: Secondary | ICD-10-CM

## 2024-01-08 DIAGNOSIS — Z8249 Family history of ischemic heart disease and other diseases of the circulatory system: Secondary | ICD-10-CM

## 2024-01-08 DIAGNOSIS — Z79891 Long term (current) use of opiate analgesic: Secondary | ICD-10-CM

## 2024-01-08 DIAGNOSIS — Z79890 Hormone replacement therapy (postmenopausal): Secondary | ICD-10-CM

## 2024-01-08 LAB — COMPREHENSIVE METABOLIC PANEL WITH GFR
ALT: 24 U/L (ref 0–44)
AST: 59 U/L — ABNORMAL HIGH (ref 15–41)
Albumin: 3.6 g/dL (ref 3.5–5.0)
Alkaline Phosphatase: 154 U/L — ABNORMAL HIGH (ref 38–126)
Anion gap: 11 (ref 5–15)
BUN: 25 mg/dL — ABNORMAL HIGH (ref 8–23)
CO2: 18 mmol/L — ABNORMAL LOW (ref 22–32)
Calcium: 9.3 mg/dL (ref 8.9–10.3)
Chloride: 109 mmol/L (ref 98–111)
Creatinine, Ser: 1.46 mg/dL — ABNORMAL HIGH (ref 0.44–1.00)
GFR, Estimated: 39 mL/min — ABNORMAL LOW
Glucose, Bld: 102 mg/dL — ABNORMAL HIGH (ref 70–99)
Potassium: 4.5 mmol/L (ref 3.5–5.1)
Sodium: 138 mmol/L (ref 135–145)
Total Bilirubin: 1.7 mg/dL — ABNORMAL HIGH (ref 0.0–1.2)
Total Protein: 6.9 g/dL (ref 6.5–8.1)

## 2024-01-08 LAB — BLOOD GAS, VENOUS
Acid-base deficit: 6 mmol/L — ABNORMAL HIGH (ref 0.0–2.0)
Bicarbonate: 19.6 mmol/L — ABNORMAL LOW (ref 20.0–28.0)
O2 Saturation: 46.7 %
Patient temperature: 37
pCO2, Ven: 38 mmHg — ABNORMAL LOW (ref 44–60)
pH, Ven: 7.32 (ref 7.25–7.43)
pO2, Ven: 33 mmHg (ref 32–45)

## 2024-01-08 LAB — URINALYSIS, ROUTINE W REFLEX MICROSCOPIC
Bilirubin Urine: NEGATIVE
Glucose, UA: NEGATIVE mg/dL
Hgb urine dipstick: NEGATIVE
Ketones, ur: NEGATIVE mg/dL
Nitrite: NEGATIVE
Protein, ur: NEGATIVE mg/dL
Specific Gravity, Urine: 1.008 (ref 1.005–1.030)
pH: 5 (ref 5.0–8.0)

## 2024-01-08 LAB — CBC
HCT: 43.3 % (ref 36.0–46.0)
Hemoglobin: 13.5 g/dL (ref 12.0–15.0)
MCH: 30.2 pg (ref 26.0–34.0)
MCHC: 31.2 g/dL (ref 30.0–36.0)
MCV: 96.9 fL (ref 80.0–100.0)
Platelets: 173 K/uL (ref 150–400)
RBC: 4.47 MIL/uL (ref 3.87–5.11)
RDW: 14.9 % (ref 11.5–15.5)
WBC: 8.1 K/uL (ref 4.0–10.5)
nRBC: 0 % (ref 0.0–0.2)

## 2024-01-08 LAB — AMMONIA: Ammonia: 121 umol/L — ABNORMAL HIGH (ref 9–35)

## 2024-01-08 LAB — ETHANOL: Alcohol, Ethyl (B): <15 mg/dL

## 2024-01-08 NOTE — Transitions of Care (Post Inpatient/ED Visit) (Signed)
 "  01/08/2024  Name: [name] MRN: [id number] DOB: 1955-06-01  Today's TOC FU Call Status: Today's TOC FU Call Status:: Successful TOC FU Call Completed TOC FU Call Complete Date: 01/08/24  Patient's Name and Date of Birth confirmed. Name, DOB  Transition Care Management Follow-up Telephone Call Date of Discharge: 01/06/24 Discharge Facility: [facility]) Type of Discharge: Inpatient Admission Primary Inpatient Discharge Diagnosis:: Hepatic encephalopathy How have you been since you were released from the hospital?:  (ate a big breakfast this morning, drinking adequate fluids, pt has confusion today and son is taking pt back to ED now, went to ED yesterday evening for same thing per son) Any questions or concerns?: No  Items Reviewed: Did you receive and understand the discharge instructions provided?: Yes Any new allergies since your discharge?: No Dietary orders reviewed?: Yes Type of Diet Ordered:: heart healthy People in Home [RPT]: child(ren), adult Name of Support/Comfort Primary Source: son/ DPR  [name]  Medications Reviewed Today: Medications Reviewed Today     Reviewed by Aura Mliss LABOR, RN (Registered Nurse) on 01/08/24 at 1149  Med List Status: <None>   Medication Order Taking? Sig Documenting Provider Last Dose Status Informant  acetaminophen  (TYLENOL ) 500 MG tablet [id number] Yes Take 500-1,000 mg by mouth every 6 (six) hours as needed for headache or fever (pain). [provider]  Active Family Member  albuterol  (VENTOLIN  HFA) 108 [id number] Base) MCG/ACT inhaler [id number] Yes Inhale 2 puffs into the lungs every 6 (six) hours as needed for wheezing or shortness of breath. [name], MD  Active Family Member  atorvastatin  (LIPITOR) 40 MG tablet [id number] Yes TAKE 1 TABLET(40 MG) BY MOUTH IN THE ERMALINDA [name], MD  Active Family Member  colchicine  0.6 MG tablet [id number] Yes Take 0.6 mg by mouth 2 (two) times daily as needed (as directed for  gout flares). [provider]  Active Family Member  copper  tablet [id number] Yes Take 2 mg by mouth in the morning and at bedtime. [provider]  Active Family Member  cyanocobalamin  (VITAMIN B12) 1000 MCG tablet [id number] Yes Take 2,000 mcg by mouth every Monday, Wednesday, and Friday. [provider]  Active Family Member  dicyclomine  (BENTYL ) 10 MG capsule [id number] Yes Take 1 capsule (10 mg total) by mouth 3 (three) times daily as needed for spasms (abdominal cramping). [name], MD  Active Family Member  EPINEPHrine  0.3 mg/0.3 mL IJ SOAJ injection [id number] Yes Inject 0.3 mg into the muscle as needed for anaphylaxis. [name], MD  Active Family Member  famotidine  (PEPCID ) 20 MG tablet [id number] Yes Take 20 mg by mouth daily as needed for heartburn or indigestion. [provider]  Active Family Member  furosemide  (LASIX ) 40 MG tablet [id number] Yes Take 1 tablet (40 mg total) by mouth daily. Jillian Buttery, MD  Active Family Member  HYDROcodone -acetaminophen  [facility]) 10-325 MG tablet [id number] Yes Take 1 tablet by mouth 3 (three) times daily as needed (for pain- may take up to a sum total of 3 tablets/24 hours). [provider]  Active Family Member           Med Note MARISA, NATHANEL SAILOR   Tue Jan 02, 2024  4:18 PM)    IRON -VITAMIN C  PO [id number] Yes Take 1 tablet by mouth daily. [provider]  Active Family Member  lactulose  (CHRONULAC ) 10 GM/15ML solution [id number] Yes Take 45 mLs (30 g total) by mouth 3 (three) times daily. Barbarann Nest, MD  Active Family Member  levothyroxine  (SYNTHROID )  175 MCG tablet [id number] Yes Take 1 tablet (175 mcg total) by mouth daily before breakfast. [name], MD  Active Family Member  magnesium  gluconate (MAGONATE) 500 (27 Mg) MG TABS tablet [id number] Yes Take 1 tablet (500 mg total) by mouth in the morning and at bedtime. [name], MD  Active Family Member  meclizine  (ANTIVERT ) 25 MG  tablet [id number] Yes Take 1 tablet (25 mg total) by mouth 3 (three) times daily as needed for dizziness. Jillian Buttery, MD  Active Family Member  megestrol  (MEGACE ) 40 MG tablet [id number] Yes Take 1 tablet (40 mg total) by mouth 2 (two) times daily. Ajewole, [name], MD  Active Family Member  methocarbamol  (ROBAXIN ) 500 MG tablet [id number] Yes Take 500 mg by mouth in the morning. [provider]  Active Family Member  midodrine  (PROAMATINE ) 10 MG tablet [id number] Yes Take 10 mg by mouth 3 (three) times daily. [provider]  Active Family Member           Med Note MARISA, NATHANEL LOISE Schaumann Dec 14, 2023  8:25 PM)    Multiple Vitamins-Minerals (CENTRUM SILVER  ULTRA WOMENS PO) [id number] Yes Take 1 tablet by mouth daily with breakfast. [provider]  Active Family Member  naloxone [facility]) nasal spray 4 mg/0.1 mL [id number] Yes Place 1 spray into the nose as needed (accidental overdose). [provider]  Active Family Member           Med Note ROLENE Whittier, [place] A   Wed Mar 08, 2023  2:39 PM)    NUCYNTA  ER 200 MG TB12 [id number] Yes Take 200 mg by mouth every 12 (twelve) hours as needed (for pain). [provider]  Active Family Member  nystatin  cream (MYCOSTATIN ) [id number] Yes Apply 1 Application topically 2 (two) times daily as needed for dry skin. Cheryle Page, MD  Active Family Member           Med Note MARISA, NATHANEL LOISE   Tue Jan 02, 2024  4:18 PM)    pantoprazole  (PROTONIX ) 40 MG tablet [id number] Yes TAKE 1 TABLET(40 MG) BY MOUTH TWICE DAILY BEFORE A MEAL  Patient taking differently: Take 40 mg by mouth 2 (two) times daily before a meal.   [name], MD  Active Family Member  potassium chloride  (KLOR-CON ) 10 MEQ tablet [id number] Yes Take 2 tablets (20 mEq total) by mouth 2 (two) times daily. [name], MD  Active Family Member  rifaximin  (XIFAXAN ) 550 MG TABS tablet [id number] Yes Take 550 mg by mouth in the morning and at bedtime.  [provider]  Active Family Member  rOPINIRole  (REQUIP ) 0.25 MG tablet [id number] Yes TAKE 1 TABLET(0.25 MG) BY MOUTH AT BEDTIME [name], MD  Active Family Member  senna-docusate (SENOKOT-S) 8.6-50 MG tablet [id number] Yes Take 1 tablet by mouth 2 (two) times daily. Duanne Butler DASEN, MD  Active Family Member  sodium bicarbonate  650 MG tablet [id number] Yes Take 1 tablet (650 mg total) by mouth 2 (two) times daily. Barbarann Nest, MD  Active Family Member  spironolactone  (ALDACTONE ) 50 MG tablet [id number] Yes Take 1 tablet (50 mg total) by mouth daily. [name], MD  Active Family Member  triamcinolone  cream (KENALOG ) 0.1 % [id number] Yes Apply 1 Application topically 2 (two) times daily as needed (skin irritation). [provider]  Active Family Member  venlafaxine  XR (EFFEXOR -XR) 75 MG 24 hr capsule [id number] Yes Take 1 capsule (75 mg total) by mouth daily with breakfast. Duanne Butler DASEN, MD  Active Family Member  Med List Note Lesly Richerd GRADE, [place] 12/29/23 9070): [name] (son) handles all medication 12/29/23 reviewed with son            Home Care and Equipment/Supplies: Were Home Health Services Ordered?: Yes Name of Home Health Agency:: [facility]- patient's son states he has not had time to talk with anyone about resumption of care due to getting ready to leave home and go to ED Has Agency set up a time to come to your home?: No Any new equipment or medical supplies ordered?: No  Functional Questionnaire: Do you need assistance with bathing/showering or dressing?: Yes (son assists as needed) Do you need assistance with meal preparation?: Yes (son assists) Do you need assistance with eating?: No Do you have difficulty maintaining continence: No Do you need assistance with getting out of bed/getting out of a chair/moving?: Yes (walker) Do you have difficulty managing or taking your medications?: Yes (son provides oversight)  Follow up appointments  reviewed: PCP Follow-up appointment confirmed?: No (pt has appointment on 02/13/24, son [name] declines for RN CM to schedule post hospital follow up, states he will call and also unsure if hospital will keep pt, pt will be leaving for ED shortly) MD Provider Line Number:[phone] Given: No Date of PCP follow-up appointment?: 02/13/24 Specialist Hospital Follow-up appointment confirmed?: NA Do you need transportation to your follow-up appointment?: No Do you understand care options if your condition(s) worsen?: Yes-patient verbalized understanding  SDOH Interventions Today    Flowsheet Row Most Recent Value  SDOH Interventions   Food Insecurity Interventions Intervention Not Indicated  Housing Interventions Intervention Not Indicated  Transportation Interventions Intervention Not Indicated  Utilities Interventions Intervention Not Indicated     Goals Addressed             This Visit's Progress    COMPLETED: VBCI Transitions of Care (TOC) Care Plan       Problems:  Recent Hospitalization for treatment of hepatic encephalopathy/ cirrhosis Per patient's son [name]/ DPR, pt went back to ED late yesterday, was sent home, now has terrible confusion and spaced out again today, was able to eat  a big breakfast but now is confused, [name] states he is getting pt dressed and they are going back to ED for fear that ammonia levels are elevated again.  [name] reports he has checked into pt being placed on transplant list and is awaiting response/ follow up [name] reports pt is taking lactulose  as prescribed and having several bowel movements per day  Goal:  Over the next 30 days, the patient will not experience hospital readmission  Interventions:  Transitions of Care: Doctor Visits  - discussed the importance of doctor visits Reviewed Signs and symptoms of infection Evaluation of current treatment plan related to acute hepatic encephalopathy, cirrhosis, self-management and patient's adherence to  plan as established by provider. Discussed plans with patient for ongoing care management follow up and provided patient with direct contact information for care management team Evaluation of current treatment plan related to cirrhosis  and patient's adherence to plan as established by provider Reviewed medications with patient and discussed importance of taking all medications as prescribed.  Reviewed compliance with lactulose  and importance of regular bowel movements to keep ammonia levels in check. Reviewed scheduled/upcoming provider appointments  Reviewed low sodium diet Reviewed importance of daily weight and keeping a log Reviewed importance of adequate protein in diet  Reviewed bleeding risk due to cirrhosis Encouraged to observe for any signs of infection, confusion  and get medical treatment for infections, confusion related to elevated ammonia levels asap Pain assessment completed Reviewed safety precautions and to always assist pt if she is having confusion  Patient Self Care Activities:  Attend all scheduled provider appointments Attend church or other social activities Call pharmacy for medication refills 3-7 days in advance of running out of medications Call provider office for new concerns or questions  Notify RN Care Manager of TOC call rescheduling needs Participate in Transition of Care Program/Attend TOC scheduled calls Perform all self care activities independently  Take medications as prescribed   Work with the social worker to address care coordination needs and will continue to work with the clinical team to address health care and disease management related needs Do not skip lactulose , it is important to keep ammonia levels in check Provide adequate protein in diet  Get emergency assistance for bleeding- rectal blood, dark black stools or vomiting of blood, confusion (increase in ammonia level)  Plan: Telephone outreach 01/16/24 @ 1045 am,  continue to follow as pt is  headed back to ED for worsening confusion         Mliss [facility], BSN RN Care Manager/ Transition of Care [place]/ [facility] [phone]  "

## 2024-01-08 NOTE — ED Triage Notes (Signed)
 Pt has been confused since yesterday, pt was seen here yesterday for same and discharged. Pt family states when taken home she didn't even understand where the toilet is.  Hx of encephalopathy

## 2024-01-08 NOTE — ED Provider Triage Note (Signed)
 Emergency Medicine Provider Triage Evaluation Note  Lusia Greis , a 68 y.o. female  was evaluated in triage.  Pt complains of increasing confusion. Patient was seen in the ED last night. Has a history of hepatic encephalopathy and was admitted last week for same -patient's son states that she was off her venlafaxine  for over 5 days - normally will get combative and confused off of her venlafaxine  - she resumed regimen yesterday.  Patient's son states when they got home she has had increase in confusion that was worse than in the emergency department yesterday.  Review of Systems  Positive: Confusion  Negative: fevers  Physical Exam  BP (!) 137/106 (BP Location: Left Arm)   Pulse 85   Temp 98 F (36.7 C) (Oral)   Resp 18   Ht 5' 2 (1.575 m)   Wt 95.3 kg   SpO2 100%   BMI 38.41 kg/m  Gen:   Awake, no distress   Resp:  Normal effort  MSK:   Moves extremities without difficulty  Other:    Medical Decision Making  Medically screening exam initiated at 4:14 PM.  Appropriate orders placed.  Sumner Boesch was informed that the remainder of the evaluation will be completed by another provider, this initial triage assessment does not replace that evaluation, and the importance of remaining in the ED until their evaluation is complete.    Willma Duwaine CR[place], [place] 01/08/24 [phone]

## 2024-01-09 DIAGNOSIS — K7682 Hepatic encephalopathy: Secondary | ICD-10-CM | POA: Diagnosis not present

## 2024-01-09 LAB — CBC WITH DIFFERENTIAL/PLATELET
Abs Immature Granulocytes: 0.02 K/uL (ref 0.00–0.07)
Basophils Absolute: 0 K/uL (ref 0.0–0.1)
Basophils Relative: 1 %
Eosinophils Absolute: 0.5 K/uL (ref 0.0–0.5)
Eosinophils Relative: 6 %
HCT: 37.3 % (ref 36.0–46.0)
Hemoglobin: 11.9 g/dL — ABNORMAL LOW (ref 12.0–15.0)
Immature Granulocytes: 0 %
Lymphocytes Relative: 20 %
Lymphs Abs: 1.5 K/uL (ref 0.7–4.0)
MCH: 30.4 pg (ref 26.0–34.0)
MCHC: 31.9 g/dL (ref 30.0–36.0)
MCV: 95.4 fL (ref 80.0–100.0)
Monocytes Absolute: 0.7 K/uL (ref 0.1–1.0)
Monocytes Relative: 9 %
Neutro Abs: 4.9 K/uL (ref 1.7–7.7)
Neutrophils Relative %: 64 %
Platelets: 162 K/uL (ref 150–400)
RBC: 3.91 MIL/uL (ref 3.87–5.11)
RDW: 15.1 % (ref 11.5–15.5)
WBC: 7.6 K/uL (ref 4.0–10.5)
nRBC: 0 % (ref 0.0–0.2)

## 2024-01-09 LAB — COMPREHENSIVE METABOLIC PANEL WITH GFR
ALT: 25 U/L (ref 0–44)
AST: 43 U/L — ABNORMAL HIGH (ref 15–41)
Albumin: 3.3 g/dL — ABNORMAL LOW (ref 3.5–5.0)
Alkaline Phosphatase: 128 U/L — ABNORMAL HIGH (ref 38–126)
Anion gap: 11 (ref 5–15)
BUN: 31 mg/dL — ABNORMAL HIGH (ref 8–23)
CO2: 19 mmol/L — ABNORMAL LOW (ref 22–32)
Calcium: 9.3 mg/dL (ref 8.9–10.3)
Chloride: 111 mmol/L (ref 98–111)
Creatinine, Ser: 1.57 mg/dL — ABNORMAL HIGH (ref 0.44–1.00)
GFR, Estimated: 36 mL/min — ABNORMAL LOW
Glucose, Bld: 115 mg/dL — ABNORMAL HIGH (ref 70–99)
Potassium: 4.1 mmol/L (ref 3.5–5.1)
Sodium: 140 mmol/L (ref 135–145)
Total Bilirubin: 1.9 mg/dL — ABNORMAL HIGH (ref 0.0–1.2)
Total Protein: 6 g/dL — ABNORMAL LOW (ref 6.5–8.1)

## 2024-01-09 LAB — BLOOD GAS, ARTERIAL
Acid-base deficit: 6.7 mmol/L — ABNORMAL HIGH (ref 0.0–2.0)
Bicarbonate: 17.2 mmol/L — ABNORMAL LOW (ref 20.0–28.0)
Drawn by: 31394
FIO2: 21 %
O2 Saturation: 99.8 %
Patient temperature: 36.9
pCO2 arterial: 29 mmHg — ABNORMAL LOW (ref 32–48)
pH, Arterial: 7.38 (ref 7.35–7.45)
pO2, Arterial: 110 mmHg — ABNORMAL HIGH (ref 83–108)

## 2024-01-09 LAB — PROTIME-INR
INR: 1.2 (ref 0.8–1.2)
Prothrombin Time: 15.4 s — ABNORMAL HIGH (ref 11.4–15.2)

## 2024-01-09 LAB — AMMONIA: Ammonia: 113 umol/L — ABNORMAL HIGH (ref 9–35)

## 2024-01-09 LAB — MAGNESIUM: Magnesium: 1.8 mg/dL (ref 1.7–2.4)

## 2024-01-09 MED ORDER — MAGNESIUM GLUCONATE 500 (27 MG) MG PO TABS
500.0000 mg | ORAL_TABLET | Freq: Two times a day (BID) | ORAL | Status: DC
  Administered 2024-01-10 – 2024-01-13 (×8): 500 mg via ORAL
  Filled 2024-01-09 (×8): qty 1

## 2024-01-09 MED ORDER — LACTULOSE 10 GM/15ML PO SOLN
30.0000 g | Freq: Once | ORAL | Status: AC
  Administered 2024-01-09: 30 g via ORAL
  Filled 2024-01-09: qty 60

## 2024-01-09 MED ORDER — ACETAMINOPHEN 325 MG PO TABS
650.0000 mg | ORAL_TABLET | Freq: Four times a day (QID) | ORAL | Status: DC | PRN
  Administered 2024-01-11: 650 mg via ORAL
  Filled 2024-01-09: qty 2

## 2024-01-09 MED ORDER — PANTOPRAZOLE SODIUM 40 MG PO TBEC
40.0000 mg | DELAYED_RELEASE_TABLET | Freq: Two times a day (BID) | ORAL | Status: DC
  Administered 2024-01-10 – 2024-01-13 (×7): 40 mg via ORAL
  Filled 2024-01-09 (×7): qty 1

## 2024-01-09 MED ORDER — SODIUM CHLORIDE 0.9 % IV SOLN: 1.0000 g | INTRAVENOUS | Status: DC

## 2024-01-09 MED ORDER — LEVOTHYROXINE SODIUM 25 MCG PO TABS
175.0000 ug | ORAL_TABLET | Freq: Every day | ORAL | Status: DC
  Administered 2024-01-10 – 2024-01-13 (×4): 175 ug via ORAL
  Filled 2024-01-09 (×5): qty 1

## 2024-01-09 MED ORDER — MEGESTROL ACETATE 40 MG PO TABS
40.0000 mg | ORAL_TABLET | Freq: Two times a day (BID) | ORAL | Status: DC
  Administered 2024-01-10 – 2024-01-13 (×8): 40 mg via ORAL
  Filled 2024-01-09 (×8): qty 1

## 2024-01-09 MED ORDER — SODIUM CHLORIDE 0.9 % IV BOLUS
500.0000 mL | Freq: Once | INTRAVENOUS | Status: AC
  Administered 2024-01-09: 500 mL via INTRAVENOUS

## 2024-01-09 MED ORDER — ATORVASTATIN CALCIUM 40 MG PO TABS
40.0000 mg | ORAL_TABLET | Freq: Every morning | ORAL | Status: DC
  Administered 2024-01-10 – 2024-01-13 (×4): 40 mg via ORAL
  Filled 2024-01-09 (×4): qty 1

## 2024-01-09 MED ORDER — VENLAFAXINE HCL ER 75 MG PO CP24
75.0000 mg | ORAL_CAPSULE | Freq: Every day | ORAL | Status: DC
  Administered 2024-01-10 – 2024-01-13 (×4): 75 mg via ORAL
  Filled 2024-01-09 (×4): qty 1

## 2024-01-09 MED ORDER — METHOCARBAMOL 500 MG PO TABS
500.0000 mg | ORAL_TABLET | Freq: Every morning | ORAL | Status: DC
  Administered 2024-01-10 – 2024-01-13 (×4): 500 mg via ORAL
  Filled 2024-01-09 (×4): qty 1

## 2024-01-09 MED ORDER — LACTULOSE 10 GM/15ML PO SOLN
30.0000 g | Freq: Four times a day (QID) | ORAL | Status: DC
  Administered 2024-01-09 – 2024-01-11 (×7): 30 g via ORAL
  Filled 2024-01-09: qty 45
  Filled 2024-01-09 (×3): qty 60
  Filled 2024-01-09 (×3): qty 45

## 2024-01-09 MED ORDER — SODIUM CHLORIDE 0.9 % IV SOLN
1.0000 g | Freq: Once | INTRAVENOUS | Status: AC
  Administered 2024-01-09: 1 g via INTRAVENOUS
  Filled 2024-01-09: qty 10

## 2024-01-09 MED ORDER — SENNOSIDES-DOCUSATE SODIUM 8.6-50 MG PO TABS
1.0000 | ORAL_TABLET | Freq: Two times a day (BID) | ORAL | Status: DC
  Administered 2024-01-10 – 2024-01-13 (×7): 1 via ORAL
  Filled 2024-01-09 (×7): qty 1

## 2024-01-09 MED ORDER — VITAMIN B-12 1000 MCG PO TABS
2000.0000 ug | ORAL_TABLET | ORAL | Status: DC
  Administered 2024-01-10 – 2024-01-12 (×2): 2000 ug via ORAL
  Filled 2024-01-09 (×3): qty 2

## 2024-01-09 MED ORDER — SODIUM BICARBONATE 650 MG PO TABS
650.0000 mg | ORAL_TABLET | Freq: Two times a day (BID) | ORAL | Status: DC
  Administered 2024-01-10 – 2024-01-11 (×4): 650 mg via ORAL
  Filled 2024-01-09 (×3): qty 1

## 2024-01-09 MED ORDER — ADULT MULTIVITAMIN W/MINERALS CH
1.0000 | ORAL_TABLET | Freq: Every day | ORAL | Status: DC
  Administered 2024-01-10 – 2024-01-13 (×4): 1 via ORAL
  Filled 2024-01-09 (×4): qty 1

## 2024-01-09 MED ORDER — HYDROCODONE-ACETAMINOPHEN 10-325 MG PO TABS
1.0000 | ORAL_TABLET | Freq: Three times a day (TID) | ORAL | Status: DC | PRN
  Administered 2024-01-10 – 2024-01-11 (×2): 1 via ORAL
  Filled 2024-01-09 (×3): qty 1

## 2024-01-09 MED ORDER — ROPINIROLE HCL 0.25 MG PO TABS
0.2500 mg | ORAL_TABLET | Freq: Every day | ORAL | Status: DC
  Administered 2024-01-10 – 2024-01-12 (×4): 0.25 mg via ORAL
  Filled 2024-01-09 (×4): qty 1

## 2024-01-09 MED ORDER — ACETAMINOPHEN 650 MG RE SUPP: 650.0000 mg | Freq: Four times a day (QID) | RECTAL | Status: DC | PRN

## 2024-01-09 MED ORDER — MIDODRINE HCL 5 MG PO TABS
10.0000 mg | ORAL_TABLET | Freq: Three times a day (TID) | ORAL | Status: DC
  Administered 2024-01-10 – 2024-01-13 (×8): 10 mg via ORAL
  Filled 2024-01-09 (×7): qty 2

## 2024-01-09 MED ORDER — RIFAXIMIN 550 MG PO TABS
550.0000 mg | ORAL_TABLET | Freq: Two times a day (BID) | ORAL | Status: DC
  Administered 2024-01-10 – 2024-01-13 (×8): 550 mg via ORAL
  Filled 2024-01-09 (×8): qty 1

## 2024-01-09 MED ORDER — MIDODRINE HCL 5 MG PO TABS: 10.0000 mg | ORAL_TABLET | Freq: Three times a day (TID) | ORAL | Status: DC

## 2024-01-09 NOTE — Assessment & Plan Note (Signed)
Continue protonix daily. 

## 2024-01-09 NOTE — Assessment & Plan Note (Signed)
 Continue home midodrine

## 2024-01-09 NOTE — Assessment & Plan Note (Signed)
Continue venlafaxine XR

## 2024-01-09 NOTE — ED Notes (Signed)
 RN to room to attempt lab work, IV isn't drawing back, pt is a hard stick, RN will attempt a straight stick, pt did have a small BM, pt  cleaned with this RN and 2 others, new brief applied with new purewick.

## 2024-01-09 NOTE — ED Provider Notes (Signed)
 " [place] EMERGENCY DEPARTMENT AT [facility] Provider Note   CSN: [id number] Arrival date & time: 01/08/24  1332     Patient presents with: Altered Mental Status   [name] is a 68 y.o. female.   68 year old female presents from home with worsening confusion in the setting of nash cirrhosis, hepatic encephalopathy, depression, short gut syndrome with chronic diarrhea, stage IIIb CKD hypothyroidism, chronic hypotension. Son is primary care giver, brought patient in on 01/07/24 with thought that her confusion was related to not taking her venlafaxine .   Call to son [name]. Seen 12/28, ammonia elevated at that time, unsure if related to meds or elevated ammonia. Patient went home, returns due to persistent altered state. Has not had her lactulose , has not had a bowel movement. Has been eating, no vomiting. Also reports pain with voiding.      Prior to Admission medications  Medication Sig Start Date End Date Taking? Authorizing Provider  acetaminophen  (TYLENOL ) 500 MG tablet Take 500-1,000 mg by mouth every 6 (six) hours as needed for headache or fever (pain).    [provider]  albuterol  (VENTOLIN  HFA) 108 (90 Base) MCG/ACT inhaler Inhale 2 puffs into the lungs every 6 (six) hours as needed for wheezing or shortness of breath. 11/13/23   [name], MD  atorvastatin  (LIPITOR) 40 MG tablet TAKE 1 TABLET(40 MG) BY MOUTH IN THE MORNING 09/18/23   [name], MD  colchicine  0.6 MG tablet Take 0.6 mg by mouth 2 (two) times daily as needed (as directed for gout flares).    [provider]  copper  tablet Take 2 mg by mouth in the morning and at bedtime.    [provider]  cyanocobalamin  (VITAMIN B12) 1000 MCG tablet Take 2,000 mcg by mouth every Monday, Wednesday, and Friday.    [provider]  dicyclomine  (BENTYL ) 10 MG capsule Take 1 capsule (10 mg total) by mouth 3 (three) times daily as needed for spasms (abdominal  cramping). 08/07/23   [name], MD  EPINEPHrine  0.3 mg/0.3 mL IJ SOAJ injection Inject 0.3 mg into the muscle as needed for anaphylaxis. 07/27/23   [name], MD  famotidine  (PEPCID ) 20 MG tablet Take 20 mg by mouth daily as needed for heartburn or indigestion.    [provider]  furosemide  (LASIX ) 40 MG tablet Take 1 tablet (40 mg total) by mouth daily. 12/18/23   Adhikari, Amrit, MD  HYDROcodone -acetaminophen  (NORCO) 10-325 MG tablet Take 1 tablet by mouth 3 (three) times daily as needed (for pain- may take up to a sum total of 3 tablets/24 hours). 06/15/23   [provider]  IRON -VITAMIN C  PO Take 1 tablet by mouth daily.    [provider]  lactulose  (CHRONULAC ) 10 GM/15ML solution Take 45 mLs (30 g total) by mouth 3 (three) times daily. 12/28/23   Barbarann Nest, MD  levothyroxine  (SYNTHROID ) 175 MCG tablet Take 1 tablet (175 mcg total) by mouth daily before breakfast. 12/21/23   [name], MD  magnesium  gluconate (MAGONATE) 500 (27 Mg) MG TABS tablet Take 1 tablet (500 mg total) by mouth in the morning and at bedtime. 05/01/23   [name], MD  meclizine  (ANTIVERT ) 25 MG tablet Take 1 tablet (25 mg total) by mouth 3 (three) times daily as needed for dizziness. 12/18/23   Jillian Buttery, MD  megestrol  (MEGACE ) 40 MG tablet Take 1 tablet (40 mg total) by mouth 2 (two) times daily. 05/04/23   Ajewole, [name], MD  methocarbamol  (ROBAXIN ) 500 MG tablet Take 500 mg by mouth in the morning.    [provider]  midodrine  (PROAMATINE ) 10 MG tablet Take 10 mg by mouth 3 (three) times daily.    [provider]  Multiple Vitamins-Minerals (CENTRUM SILVER  ULTRA WOMENS PO) Take 1 tablet by mouth daily with breakfast.    [provider]  naloxone (NARCAN) nasal spray 4 mg/0.1 mL Place 1 spray into the nose as needed (accidental overdose). 03/25/22   [provider]  NUCYNTA  ER 200 MG TB12 Take 200 mg by mouth every 12 (twelve)  hours as needed (for pain).    [provider]  nystatin  cream (MYCOSTATIN ) Apply 1 Application topically 2 (two) times daily as needed for dry skin. 05/29/23   Cheryle Page, MD  pantoprazole  (PROTONIX ) 40 MG tablet TAKE 1 TABLET(40 MG) BY MOUTH TWICE DAILY BEFORE A MEAL Patient taking differently: Take 40 mg by mouth 2 (two) times daily before a meal. 12/21/23   [name], MD  potassium chloride  (KLOR-CON ) 10 MEQ tablet Take 2 tablets (20 mEq total) by mouth 2 (two) times daily. 08/07/23   [name], MD  rifaximin  (XIFAXAN ) 550 MG TABS tablet Take 550 mg by mouth in the morning and at bedtime.    [provider]  rOPINIRole  (REQUIP ) 0.25 MG tablet TAKE 1 TABLET(0.25 MG) BY MOUTH AT BEDTIME 06/10/23   [name], MD  senna-docusate (SENOKOT-S) 8.6-50 MG tablet Take 1 tablet by mouth 2 (two) times daily. 11/30/23   Duanne Butler DASEN, MD  sodium bicarbonate  650 MG tablet Take 1 tablet (650 mg total) by mouth 2 (two) times daily. 12/28/23   Barbarann Nest, MD  spironolactone  (ALDACTONE ) 50 MG tablet Take 1 tablet (50 mg total) by mouth daily. 11/13/23   [name], MD  triamcinolone  cream (KENALOG ) 0.1 % Apply 1 Application topically 2 (two) times daily as needed (skin irritation).    [provider]  venlafaxine  XR (EFFEXOR -XR) 75 MG 24 hr capsule Take 1 capsule (75 mg total) by mouth daily with breakfast. 12/13/23   Duanne Butler DASEN, MD    Allergies: Ace inhibitors, Gadolinium derivatives, Ivp dye [iodinated contrast media], Wound dressing adhesive, Desitin [zinc  oxide], Nsaids, Butrans [buprenorphine], Yellow jacket venom, Hibiclens  [chlorhexidine  gluconate], Penicillins, Vibramycin [doxycycline], and Zofran  [ondansetron ]    Review of Systems Level 5 caveat for change in mental status  Updated Vital Signs BP 135/62   Pulse 97   Temp 98.5 F (36.9 C) (Oral)   Resp 18   Ht 5' 2 (1.575 m)   Wt 95.3 kg   SpO2 100%   BMI 38.41 kg/m   Physical  Exam Vitals and nursing note reviewed.  Constitutional:      General: She is not in acute distress.    Appearance: She is obese. She is not ill-appearing.     Comments: Somnolent, rouses to verbal stimuli   HENT:     Head: Normocephalic and atraumatic.     Nose: Nose normal.     Mouth/Throat:     Mouth: Mucous membranes are dry.  Cardiovascular:     Rate and Rhythm: Normal rate and regular rhythm.     Heart sounds: Normal heart sounds.  Pulmonary:     Effort: Pulmonary effort is normal.     Breath sounds: Normal breath sounds.  Abdominal:     Palpations: Abdomen is soft.     Tenderness: There is no abdominal tenderness.  Musculoskeletal:     Cervical back: Neck  supple.     Right lower leg: Edema present.     Left lower leg: Edema present.  Skin:    General: Skin is warm and dry.     Findings: Bruising present.     Comments: Bruising under chin area   Neurological:     Comments: Oriented to person and place     (all labs ordered are listed, but only abnormal results are displayed) Labs Reviewed  COMPREHENSIVE METABOLIC PANEL WITH GFR - Abnormal; Notable for the following components:      Result Value   CO2 18 (*)    Glucose, Bld 102 (*)    BUN 25 (*)    Creatinine, Ser 1.46 (*)    AST 59 (*)    Alkaline Phosphatase 154 (*)    Total Bilirubin 1.7 (*)    GFR, Estimated 39 (*)    All other components within normal limits  URINALYSIS, ROUTINE W REFLEX MICROSCOPIC - Abnormal; Notable for the following components:   Leukocytes,Ua TRACE (*)    Bacteria, UA RARE (*)    All other components within normal limits  AMMONIA - Abnormal; Notable for the following components:   Ammonia 121 (*)    All other components within normal limits  BLOOD GAS, VENOUS - Abnormal; Notable for the following components:   pCO2, Ven 38 (*)    Bicarbonate 19.6 (*)    Acid-base deficit 6.0 (*)    All other components within normal limits  BLOOD GAS, ARTERIAL - Abnormal; Notable for the  following components:   pCO2 arterial 29 (*)    pO2, Arterial 110 (*)    Bicarbonate 17.2 (*)    Acid-base deficit 6.7 (*)    All other components within normal limits  URINE CULTURE  CBC  ETHANOL  COMPREHENSIVE METABOLIC PANEL WITH GFR  CBC WITH DIFFERENTIAL/PLATELET  PROTIME-INR    EKG: EKG Interpretation Date/Time:  Monday January 08 2024 18:10:45 EST Ventricular Rate:  91 PR Interval:  188 QRS Duration:  80 QT Interval:  374 QTC Calculation: 460 R Axis:   -16  Text Interpretation: Sinus rhythm with occasional Premature ventricular complexes Minimal voltage criteria for LVH, may be normal variant ( R in aVL ) Anteroseptal infarct , age undetermined Abnormal ECG When compared with ECG of 02-Jan-2024 14:01, No significant change since last tracing Confirmed by Towana Sharper [phone]) on 01/09/2024 7:09:08 AM  Radiology: CT HEAD WO CONTRAST Result Date: 01/08/2024 EXAM: CT HEAD WITHOUT CONTRAST 01/08/2024 04:42:05 PM TECHNIQUE: CT of the head was performed without the administration of intravenous contrast. Automated exposure control, iterative reconstruction, and/or weight based adjustment of the mA/kV was utilized to reduce the radiation dose to as low as reasonably achievable. COMPARISON: Head CT 12/20/2023 and MRI 10/17/2022. CLINICAL HISTORY: Mental status change, unknown cause. FINDINGS: The examination is mildly motion degraded. BRAIN AND VENTRICLES: There is no evidence of an acute infarct, intracranial hemorrhage, mass, midline shift, hydrocephalus, or extra-axial fluid collection. Cerebral volume is within normal limits for age. Patchy hypodensities in the cerebral white matter are similar to the prior CT and nonspecific but compatible with moderate chronic small vessel ischemic disease. Calcified atherosclerosis at the skull base. ORBITS: Left cataract extraction. SINUSES: No acute abnormality. SOFT TISSUES AND SKULL: No acute soft tissue abnormality. No skull fracture.  IMPRESSION: 1. No acute intracranial abnormality. 2. Moderate chronic small vessel ischemic disease. Electronically signed by: Dasie Hamburg MD 01/08/2024 04:58 PM EST RP Workstation: [id number]     Procedures   Medications  Ordered in the ED  cefTRIAXone  (ROCEPHIN ) 1 g in sodium chloride  0.9 % 100 mL IVPB (1 g Intravenous New Bag/Given 01/09/24 1809)  lactulose  (CHRONULAC ) 10 GM/15ML solution 30 g (30 g Oral Given 01/09/24 1554)  sodium chloride  0.9 % bolus 500 mL (500 mLs Intravenous New Bag/Given 01/09/24 1809)                                    Medical Decision Making Amount and/or Complexity of Data Reviewed Labs: ordered.  Risk Prescription drug management. Decision regarding hospitalization.   This patient presents to the ED for concern of altered mental status, this involves an extensive number of treatment options, and is a complaint that carries with it a high risk of complications and morbidity.  The differential diagnosis includes but not limited to hepatic encephalopathy, metabolic/electrolyte abnormality, UTI, PNA   Co morbidities / Chronic conditions that complicate the patient evaluation  PE, nash, short guy syndrome, cirrhosis, DVT, UTI, hepatic encephalopathy    Additional history obtained:  Additional history obtained from EMR External records from outside source obtained and reviewed including prior labs and imaging. ER visit dated 01/07/24 with elevated ammonia    Lab Tests:  I Ordered, and personally interpreted labs.  The pertinent results include: Urinalysis with trace leukocytes and rare bacteria, add on urine culture.  CBC within normals.  CMP with creatinine 1.46.  VBG with normal pH.  ABG, not retaining CO2.  Ammonia elevated 121, alcohol negative.   Imaging Studies ordered:  I ordered imaging studies including CT head I independently visualized and interpreted imaging which showed no acute process I agree with the radiologist  interpretation   Cardiac Monitoring: / EKG:  The patient was maintained on a cardiac monitor.  I personally viewed and interpreted the cardiac monitored which showed an underlying rhythm of: sinus rhythm, rate 91   Problem List / ED Course / Critical interventions / Medication management  68 year old female presents with complaint of altered mental status in the setting of chronic liver disease and elevated ammonia. History provided by son, patient's primary care giver, over the phone. Provided with lactulose , admitted for further management.  I ordered medication including lactulose  Reevaluation of the patient after these medicines showed that the patient remained somnolent, rouses to voice and falls back asleep I have reviewed the patients home medicines and have made adjustments as needed   Consultations Obtained:  I requested consultation with the ER attending, Dr. Rogelia,  and discussed lab and imaging findings as well as pertinent plan - they recommend: Also the patient and agrees with plan of care. Case discussed with Dr. Waddell with [facility]  hospitalist service will consult for admission.   Social Determinants of Health:  Lives at home with son   Test / Admission - Considered:  admit      Final diagnoses:  Hepatic encephalopathy (HCC)  Urinary tract infection in female    ED Discharge Orders     None          [name] [name] 01/09/24 1814  "

## 2024-01-09 NOTE — Assessment & Plan Note (Signed)
"  Continue atorvastatin   "

## 2024-01-09 NOTE — Assessment & Plan Note (Signed)
 Platelets of 162 at time of admit

## 2024-01-09 NOTE — Assessment & Plan Note (Addendum)
 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   - CT head negative for acute changes.  UA not suspicious for UTI, but patient reported dysuria, pain and urinary frequency. Culture pending, continue rocephin   - Lactulose  30 g 4 times daily orally if passes swallow screen  - trend ammonia  - Continue rifaximin  550 mg twice daily - hold lasix  and spironolactone  tonight with slight bump in creatinine  -son reports waiting to see transplant surgeon  - Continue midodrine  tomorrow if start back above medications

## 2024-01-09 NOTE — ED Notes (Signed)
 Patient sons voiced concern for mothers well being while waiting in the lobby. Triage RN notified.

## 2024-01-09 NOTE — Assessment & Plan Note (Signed)
 Continue ropinirole

## 2024-01-09 NOTE — Assessment & Plan Note (Addendum)
 Creatinine 1.1-1.2 baseline Presenting creatinine of 1.46, does not meet AKI Hold lasix  and spironolactone  tonight  If creatinine continues to rise will need to decrease lasix  dose  Continue to follow closely

## 2024-01-09 NOTE — H&P (Signed)
 " History and Physical    Patient: [name] FMW:[id number] DOB: 10/29/1955 DOA: 01/08/2024 DOS: the patient was seen and examined on 01/09/2024 PCP: [name], MD  Patient coming from: Home - lives with her son. She ambulates with walker.    Chief Complaint: AMS  HPI: Hamdi Vari is a 68 y.o. female with medical history significant of NASH cirrhosis and associated hepatic encephalopathy, depression, short gut syndrome with chronic diarrhea, CKD stage 3A, ACD, hypothyroidism, chronic hypotension who presented to ED with confusion. History from her son. She was recently admitted 12/23-12/27 for same complaints. When she went home she was fine, but got really tired.  He brought her back in on Sunday to ED and was sent home. She then became acutely confused around 2-3AM. He gave her an extra lacutulose Monday AM around 9AM  She had around 4-5 BM before they got to ED. Due to confusion he brought her to ED around 2 in there afternoon. She has not had her medication since that time. He also states she has not slept in over 24 hours.    Per son she has not been sick. No fever, chills. NO N/V or complaints of abdominal pain. She has complained of burning with urination and pain with urination. She has also had urinary frequency.    Admitted 12/4, 12/10 and 12/23 for hepatic encephalopathy/NASH cirrhosis.  She does not smoke or drink alcohol.    ER Course:  vitals: afebrile, bp: 137/106, HR: 85, RR: 18, oxygen: 100%RA Pertinent labs: BUN: 25, creatinine: 1.46, AST: 59, t.bili: 1.7, ammonia: 121. UA trace leuk and rare bacteria CT head: no acute finding. Moderate chronic small vessel ischemic disease.  In ED: given 500cc IVF and rocephin for UTI. Given lactulose TRH asked to admit    Review of Systems: unable to review all systems due to the inability of the patient to answer questions. 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   Hyperammonemia 09/30/2022   Hypertension    Kidney stone    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   Obesity    Pulmonary emboli (HCC) 01/10/2009   Pulmonary embolism (HCC)    Short gut syndrome    Thyroid disease    Past Surgical History:  Procedure Laterality Date   ABDOMINAL WALL MESH  REMOVAL     20 16   CESAREAN SECTION WITH BILATERAL TUBAL LIGATION  04/30/1985   CHOLECYSTECTOMY  05/2007   Exploratory laparotomy, lysis of adhesions, takedown of ileostomy with small bowel resection, open cholecystectomy and ileocolostomy   COLON SURGERY  01/2007   exploratory laparotomy with right colecotmy and end ileostomy   COLONOSCOPY  04/02/2019   Dr Gunnar Chris Daniels, MD [facility]   ESOPHAGOGASTRODUODENOSCOPY (EGD) WITH PROPOFOL  N/A 10/19/2022   Procedure: ESOPHAGOGASTRODUODENOSCOPY (EGD) WITH PROPOFOL ;  Surgeon: Saintclair Jasper, MD;  Location: [facility];  Service: Gastroenterology;  Laterality: N/A;   ESOPHAGOSCOPY  04/02/2019   EYE SURGERY Left 10/06/2020  Cataract extraciton w/intraocular lens implant- Dr Caresse   HERNIA REPAIR  10/2007   ILEOSTOMY  2009   states bowel perfotation wih colonsocopy   ILEOSTOMY CLOSURE  2009   INCISIONAL HERNIA REPAIR  10/11/2018   IR KYPHO THORACIC WITH BONE BIOPSY  08/31/2022   IR KYPHO THORACIC WITH BONE BIOPSY  10/27/2022   IR RADIOLOGIST EVAL & MGMT  09/27/2022   KIDNEY STONE SURGERY     LIVER RESECTION     2009   PANNICULECTOMY     repair of abdominal wall hernia    WRIST SURGERY     tendonitis   Social History:  reports that she quit smoking about 39 years ago. Her smoking use included cigarettes. She has been exposed to tobacco smoke. She has never used smokeless tobacco. She reports that she does not drink alcohol and does not use drugs.  Allergies[1]  Family History  Problem Relation Age of Onset   Uterine cancer Mother    Bone cancer Father    Heart disease Father    Hyperlipidemia Father    Dementia Father    Hypertension Father    Skin cancer Sister    Hypertension Sister    Diabetes Sister    Hyperlipidemia Brother    Hypertension Brother    Heart disease Brother     Prior to Admission medications  Medication Sig Start Date End Date Taking? Authorizing Provider  acetaminophen  (TYLENOL ) 500 MG tablet Take 500-1,000 mg by mouth every 6 (six) hours as needed for headache or fever (pain).    [provider]  albuterol  (VENTOLIN  HFA) 108 (90 Base) MCG/ACT inhaler Inhale 2 puffs into the lungs every 6 (six) hours as needed for wheezing or shortness of breath. 11/13/23   [name], MD  atorvastatin  (LIPITOR) 40 MG tablet TAKE 1 TABLET(40 MG) BY MOUTH IN THE MORNING 09/18/23   [name], MD  colchicine  0.6 MG tablet Take 0.6 mg by mouth 2 (two) times daily as needed (as directed for gout flares).    [provider]  copper  tablet Take 2 mg by mouth in the morning and at bedtime.    [provider]  cyanocobalamin  (VITAMIN B12) 1000 MCG tablet Take 2,000 mcg by mouth every Monday, Wednesday, and Friday.    [provider]  dicyclomine  (BENTYL ) 10 MG capsule Take 1 capsule (10 mg total) by mouth 3 (three) times daily as needed for spasms (abdominal cramping). 08/07/23   [name], MD  EPINEPHrine  0.3 mg/0.3 mL IJ SOAJ injection Inject 0.3 mg into the muscle as needed for anaphylaxis. 07/27/23   [name], MD  famotidine  (PEPCID ) 20 MG tablet Take 20 mg by mouth daily as needed for heartburn or  indigestion.    [provider]  furosemide  (LASIX ) 40 MG tablet Take 1 tablet (40 mg total) by mouth daily. 12/18/23   Jillian Buttery, MD  HYDROcodone -acetaminophen  (NORCO) 10-325 MG tablet Take 1 tablet by mouth 3 (three) times daily as needed (for pain- may take up to a sum total of 3 tablets/24 hours). 06/15/23   [provider]  IRON -VITAMIN C  PO Take 1 tablet by mouth daily.    [provider]  lactulose  (CHRONULAC ) 10 GM/15ML solution Take 45 mLs (30 g total) by mouth 3 (three) times daily. 12/28/23   Barbarann Nest, MD  levothyroxine  (SYNTHROID ) 175 MCG tablet Take 1 tablet (175 mcg total) by mouth daily before breakfast. 12/21/23   [name], MD  magnesium  gluconate (MAGONATE) 500 (27  Mg) MG TABS tablet Take 1 tablet (500 mg total) by mouth in the morning and at bedtime. 05/01/23   [name], MD  meclizine  (ANTIVERT ) 25 MG tablet Take 1 tablet (25 mg total) by mouth 3 (three) times daily as needed for dizziness. 12/18/23   Jillian Buttery, MD  megestrol  (MEGACE ) 40 MG tablet Take 1 tablet (40 mg total) by mouth 2 (two) times daily. 05/04/23   Ajewole, [name], MD  methocarbamol  (ROBAXIN ) 500 MG tablet Take 500 mg by mouth in the morning.    [provider]  midodrine  (PROAMATINE ) 10 MG tablet Take 10 mg by mouth 3 (three) times daily.    [provider]  Multiple Vitamins-Minerals (CENTRUM SILVER  ULTRA WOMENS PO) Take 1 tablet by mouth daily with breakfast.    [provider]  naloxone (NARCAN) nasal spray 4 mg/0.1 mL Place 1 spray into the nose as needed (accidental overdose). 03/25/22   [provider]  NUCYNTA  ER 200 MG TB12 Take 200 mg by mouth every 12 (twelve) hours as needed (for pain).    [provider]  nystatin  cream (MYCOSTATIN ) Apply 1 Application topically 2 (two) times daily as needed for dry skin. 05/29/23   Cheryle Page, MD  pantoprazole  (PROTONIX ) 40 MG tablet TAKE 1 TABLET(40 MG) BY MOUTH  TWICE DAILY BEFORE A MEAL Patient taking differently: Take 40 mg by mouth 2 (two) times daily before a meal. 12/21/23   [name], MD  potassium chloride  (KLOR-CON ) 10 MEQ tablet Take 2 tablets (20 mEq total) by mouth 2 (two) times daily. 08/07/23   [name], MD  rifaximin  (XIFAXAN ) 550 MG TABS tablet Take 550 mg by mouth in the morning and at bedtime.    [provider]  rOPINIRole  (REQUIP ) 0.25 MG tablet TAKE 1 TABLET(0.25 MG) BY MOUTH AT BEDTIME 06/10/23   [name], MD  senna-docusate (SENOKOT-S) 8.6-50 MG tablet Take 1 tablet by mouth 2 (two) times daily. 11/30/23   Duanne Butler DASEN, MD  sodium bicarbonate  650 MG tablet Take 1 tablet (650 mg total) by mouth 2 (two) times daily. 12/28/23   Barbarann Nest, MD  spironolactone  (ALDACTONE ) 50 MG tablet Take 1 tablet (50 mg total) by mouth daily. 11/13/23   [name], MD  triamcinolone  cream (KENALOG ) 0.1 % Apply 1 Application topically 2 (two) times daily as needed (skin irritation).    [provider]  venlafaxine  XR (EFFEXOR -XR) 75 MG 24 hr capsule Take 1 capsule (75 mg total) by mouth daily with breakfast. 12/13/23   Duanne Butler DASEN, MD    Physical Exam: Vitals:   01/09/24 2200 01/09/24 2230 01/09/24 2235 01/09/24 2300  BP: 116/62 128/73  122/74  Pulse: 80 95  94  Resp:      Temp:   98.2 F (36.8 C)   TempSrc:   Oral   SpO2: 100% 100%  100%  Weight:      Height:       General:  Appears calm and comfortable and is in NAD. Sleeping soundly. Morbidly obese  Eyes:  PERRL, EOMI, normal lids, iris. Left eye white  ENT:  grossly normal hearing, lips & tongue, mmm; appropriate dentition Neck:  no LAD, masses or thyromegaly; no carotid bruits Cardiovascular:  RRR, +systolic murmur. No LE edema.  Respiratory:   CTA bilaterally with no wheezes/rales/rhonchi.  Normal respiratory effort. Abdomen:  soft, NT, ND, NABS Back:   normal alignment, no CVAT Skin:  no rash or induration seen on limited  exam Musculoskeletal:  Unable to test  Lower extremity:  No LE edema.  Limited foot exam with no ulcerations.  2+ distal pulses. Psychiatric:  arouses and answers her son, but will not answer my questions. Opens eyes on command  Neurologic:  limited exam. Arouses to her name and opens eyes. Answers her son, but will not answer orientation questions.    Radiological Exams on Admission: Independently reviewed - see discussion in A/P where applicable  CT HEAD WO CONTRAST Result Date: 01/08/2024 EXAM: CT HEAD WITHOUT CONTRAST 01/08/2024 04:42:05 PM TECHNIQUE: CT of the head was performed without the administration of intravenous contrast. Automated exposure control, iterative reconstruction, and/or weight based adjustment of the mA/kV was utilized to reduce the radiation dose to as low as reasonably achievable. COMPARISON: Head CT 12/20/2023 and MRI 10/17/2022. CLINICAL HISTORY: Mental status change, unknown cause. FINDINGS: The examination is mildly motion degraded. BRAIN AND VENTRICLES: There is no evidence of an acute infarct, intracranial hemorrhage, mass, midline shift, hydrocephalus, or extra-axial fluid collection. Cerebral volume is within normal limits for age. Patchy hypodensities in the cerebral white matter are similar to the prior CT and nonspecific but compatible with moderate chronic small vessel ischemic disease. Calcified atherosclerosis at the skull base. ORBITS: Left cataract extraction. SINUSES: No acute abnormality. SOFT TISSUES AND SKULL: No acute soft tissue abnormality. No skull fracture. IMPRESSION: 1. No acute intracranial abnormality. 2. Moderate chronic small vessel ischemic disease. Electronically signed by: Dasie Hamburg MD 01/08/2024 04:58 PM EST RP Workstation: [id number]    EKG: Independently reviewed.  NSR with rate 91; nonspecific ST changes with no evidence of acute ischemia with PVC    Labs on Admission: I have personally reviewed the available labs and imaging  studies at the time of the admission.  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   - CT head negative for acute changes.  UA not suspicious for UTI, but patient reported dysuria, pain and urinary frequency. Culture pending, continue rocephin   - Lactulose  30 g 4 times daily orally if passes swallow screen  - trend ammonia  - Continue rifaximin  550 mg twice daily - hold lasix  and spironolactone  tonight with slight bump in creatinine  -son reports waiting to see transplant surgeon  - Continue midodrine  tomorrow if start back above medications   Chronic kidney disease, stage 3a (HCC) Creatinine 1.1-1.2 baseline Presenting creatinine of 1.46, does not meet AKI Hold lasix  and spironolactone  tonight  If creatinine continues to rise will need to decrease lasix  dose  Continue to follow closely   Chronic hypotension Continue home midodrine    Chronic back pain Patient is on chronic narcotic pain regimen for chronic back and abdominal pain. Son states she is  no longer on nucynta . Son also states she has not had norco either. Takes robaxin  for pain  Hold norco until more  alert Continue robaxin  PRN   Generalized anxiety disorder Continue venlafaxine  XR.   GERD (gastroesophageal reflux disease) Continue protonix  daily   Hypothyroidism TSH wnl in 11/2023 Continue home synthroid    Hyperlipidemia Continue atorvastatin .   Thrombocytopenia (HCC) - Baseline 100-130K Platelets of 162 at time of admit   Restless leg syndrome Continue ropinirole .      Advance Care Planning:   Code Status: Do not attempt resuscitation (DNR) PRE-ARREST INTERVENTIONS DESIRED    Consultants: None    Procedures: None    Antibiotics: Rocephin  12/30    DVT Prophylaxis: SCDs  Family Communication: son at bedside   Severity of Illness: The appropriate patient status for this patient is OBSERVATION. Observation status is judged to be reasonable and necessary in order to provide the required intensity of service to ensure the patient's safety. The patient's presenting symptoms, physical exam findings, and initial radiographic and laboratory data in the context of their medical condition is felt to place them at decreased risk for further clinical deterioration. Furthermore, it is anticipated that the patient will be medically stable for discharge from the hospital within 2 midnights of admission.   Author: Isaiah Geralds, MD 01/09/2024 11:01 PM  For on call review [email or web address].      [1]  Allergies Allergen Reactions   Ace Inhibitors Anaphylaxis and Swelling   Gadolinium Derivatives Anaphylaxis   Ivp Dye [Iodinated Contrast Media] Anaphylaxis   Wound Dressing Adhesive Dermatitis   Desitin [Zinc  Oxide] Rash and Other (See Comments)    Worsens rash   Nsaids Other (See Comments)    Contraindication due to CKD    Butrans [Buprenorphine] Other (See Comments)    Discontinued due to liver function   Yellow Jacket Venom Swelling   Hibiclens  [Chlorhexidine  Gluconate] Rash and Other (See Comments)    Worsens rash   Penicillins Dermatitis and Rash   Vibramycin  [Doxycycline] Rash   Zofran  [Ondansetron ] Rash   "

## 2024-01-09 NOTE — Assessment & Plan Note (Addendum)
 Patient is on chronic narcotic pain regimen for chronic back and abdominal pain. Son states she is no longer on nucynta . Son also states she has not had norco either. Takes robaxin  for pain  Hold norco until more alert Continue robaxin  PRN

## 2024-01-09 NOTE — ED Notes (Signed)
 Medications and labs delayed due to difficult IV start. IV team order request submitted.

## 2024-01-09 NOTE — Assessment & Plan Note (Signed)
 TSH wnl in 11/2023 Continue home synthroid 

## 2024-01-10 DIAGNOSIS — I129 Hypertensive chronic kidney disease with stage 1 through stage 4 chronic kidney disease, or unspecified chronic kidney disease: Secondary | ICD-10-CM | POA: Diagnosis present

## 2024-01-10 DIAGNOSIS — K7682 Hepatic encephalopathy: Secondary | ICD-10-CM | POA: Diagnosis present

## 2024-01-10 DIAGNOSIS — K90829 Short bowel syndrome, unspecified: Secondary | ICD-10-CM | POA: Diagnosis present

## 2024-01-10 DIAGNOSIS — K529 Noninfective gastroenteritis and colitis, unspecified: Secondary | ICD-10-CM | POA: Diagnosis present

## 2024-01-10 DIAGNOSIS — I9589 Other hypotension: Secondary | ICD-10-CM | POA: Diagnosis present

## 2024-01-10 DIAGNOSIS — E876 Hypokalemia: Secondary | ICD-10-CM | POA: Diagnosis not present

## 2024-01-10 DIAGNOSIS — R4182 Altered mental status, unspecified: Secondary | ICD-10-CM | POA: Diagnosis present

## 2024-01-10 DIAGNOSIS — K219 Gastro-esophageal reflux disease without esophagitis: Secondary | ICD-10-CM | POA: Diagnosis present

## 2024-01-10 DIAGNOSIS — K7581 Nonalcoholic steatohepatitis (NASH): Secondary | ICD-10-CM | POA: Diagnosis present

## 2024-01-10 DIAGNOSIS — N1832 Chronic kidney disease, stage 3b: Secondary | ICD-10-CM | POA: Diagnosis present

## 2024-01-10 DIAGNOSIS — E538 Deficiency of other specified B group vitamins: Secondary | ICD-10-CM | POA: Diagnosis present

## 2024-01-10 DIAGNOSIS — D631 Anemia in chronic kidney disease: Secondary | ICD-10-CM | POA: Diagnosis present

## 2024-01-10 DIAGNOSIS — F411 Generalized anxiety disorder: Secondary | ICD-10-CM | POA: Diagnosis present

## 2024-01-10 DIAGNOSIS — E7220 Disorder of urea cycle metabolism, unspecified: Secondary | ICD-10-CM | POA: Diagnosis present

## 2024-01-10 DIAGNOSIS — Z66 Do not resuscitate: Secondary | ICD-10-CM | POA: Diagnosis present

## 2024-01-10 DIAGNOSIS — E860 Dehydration: Secondary | ICD-10-CM | POA: Diagnosis present

## 2024-01-10 DIAGNOSIS — N390 Urinary tract infection, site not specified: Secondary | ICD-10-CM | POA: Diagnosis present

## 2024-01-10 DIAGNOSIS — E039 Hypothyroidism, unspecified: Secondary | ICD-10-CM | POA: Diagnosis present

## 2024-01-10 DIAGNOSIS — D696 Thrombocytopenia, unspecified: Secondary | ICD-10-CM | POA: Diagnosis present

## 2024-01-10 DIAGNOSIS — G2581 Restless legs syndrome: Secondary | ICD-10-CM | POA: Diagnosis present

## 2024-01-10 DIAGNOSIS — K5900 Constipation, unspecified: Secondary | ICD-10-CM | POA: Diagnosis not present

## 2024-01-10 DIAGNOSIS — F32A Depression, unspecified: Secondary | ICD-10-CM | POA: Diagnosis present

## 2024-01-10 DIAGNOSIS — M549 Dorsalgia, unspecified: Secondary | ICD-10-CM | POA: Diagnosis present

## 2024-01-10 DIAGNOSIS — N179 Acute kidney failure, unspecified: Secondary | ICD-10-CM | POA: Diagnosis present

## 2024-01-10 DIAGNOSIS — E785 Hyperlipidemia, unspecified: Secondary | ICD-10-CM | POA: Diagnosis present

## 2024-01-10 DIAGNOSIS — G8929 Other chronic pain: Secondary | ICD-10-CM | POA: Diagnosis present

## 2024-01-10 LAB — CBC
HCT: 33.2 % — ABNORMAL LOW (ref 36.0–46.0)
Hemoglobin: 10.7 g/dL — ABNORMAL LOW (ref 12.0–15.0)
MCH: 31.1 pg (ref 26.0–34.0)
MCHC: 32.2 g/dL (ref 30.0–36.0)
MCV: 96.5 fL (ref 80.0–100.0)
Platelets: 111 K/uL — ABNORMAL LOW (ref 150–400)
RBC: 3.44 MIL/uL — ABNORMAL LOW (ref 3.87–5.11)
RDW: 15 % (ref 11.5–15.5)
WBC: 5.5 K/uL (ref 4.0–10.5)
nRBC: 0 % (ref 0.0–0.2)

## 2024-01-10 LAB — COMPREHENSIVE METABOLIC PANEL WITH GFR
ALT: 19 U/L (ref 0–44)
AST: 34 U/L (ref 15–41)
Albumin: 2.8 g/dL — ABNORMAL LOW (ref 3.5–5.0)
Alkaline Phosphatase: 104 U/L (ref 38–126)
Anion gap: 9 (ref 5–15)
BUN: 27 mg/dL — ABNORMAL HIGH (ref 8–23)
CO2: 18 mmol/L — ABNORMAL LOW (ref 22–32)
Calcium: 8.5 mg/dL — ABNORMAL LOW (ref 8.9–10.3)
Chloride: 118 mmol/L — ABNORMAL HIGH (ref 98–111)
Creatinine, Ser: 1.22 mg/dL — ABNORMAL HIGH (ref 0.44–1.00)
GFR, Estimated: 49 mL/min — ABNORMAL LOW
Glucose, Bld: 91 mg/dL (ref 70–99)
Potassium: 3.6 mmol/L (ref 3.5–5.1)
Sodium: 144 mmol/L (ref 135–145)
Total Bilirubin: 1.9 mg/dL — ABNORMAL HIGH (ref 0.0–1.2)
Total Protein: 4.9 g/dL — ABNORMAL LOW (ref 6.5–8.1)

## 2024-01-10 LAB — AMMONIA: Ammonia: 87 umol/L — ABNORMAL HIGH (ref 9–35)

## 2024-01-10 MED ORDER — SODIUM CHLORIDE 0.9 % IV SOLN
1.0000 g | INTRAVENOUS | Status: DC
  Administered 2024-01-10 – 2024-01-12 (×3): 1 g via INTRAVENOUS
  Filled 2024-01-10 (×3): qty 10

## 2024-01-10 MED ORDER — WHITE PETROLATUM EX OINT
TOPICAL_OINTMENT | Freq: Two times a day (BID) | CUTANEOUS | Status: DC
  Administered 2024-01-10 – 2024-01-13 (×7): 1 via TOPICAL
  Filled 2024-01-10 (×7): qty 5

## 2024-01-10 MED ORDER — SODIUM CHLORIDE 0.9 % IV SOLN: INTRAVENOUS | Status: AC

## 2024-01-10 NOTE — Consult Note (Signed)
 WOC Nurse Consult Note: Reason for Consult:  perineum and groin Patient wears incontinence brief at home, she is incontinent of bowel and bladder. She is on lactulose  with chronic diarrhea She has been admitted in the past with similar irritation  PATIENT HAS DOCUMENTED ZINC  OXIDE ALLERGY   Wound type: Irritant contact dermatitis  ICD-10 CM Codes for Irritant Dermatitis L24A2 - Due to fecal, urinary or dual incontinence Pressure Injury POA: NA Measurement: NA Wound bed: see nursing flow sheets Drainage (amount, consistency, odor) see nursing flow sheets Periwound: intact  Dressing procedure/placement/frequency: Verified allergy in chart Verified No-sting barrier wipes do not have zinc  in them.   Cavilon Wipes Ingredients: Hexamethyldisiloxane, isooctane, acrylate terpolymer, polyphenylmethylsiloxane.  [name] [facility], CNS, CWON-AP [phone]

## 2024-01-10 NOTE — Consult Note (Signed)
 WOC Nurse Consult Note: Consult requested for sacrum and buttocks.  Secure chat message sent as follows:, Please note that the WOC nursing team is utilizing a standardized work plan to manage patient consults. We are triaging consults and will try to see the patients within 24 hours. Wound photos in the patient's chart allow us  to consult on the patient in the most efficient and timely manner. There is not a WOC team member working on that campus today; we will provide topical treatment orders when a photo is available.  Thank-you,  Stephane Fought MSN, RN, CWOCN, CWCN-AP, CNS Contact Mon-Fri 0700-1500: [phone]

## 2024-01-10 NOTE — ED Notes (Addendum)
 Pt was changed along with the sheets.

## 2024-01-10 NOTE — Progress Notes (Signed)
 [facility] PROGRESS NOTE    Progress Note  Todd Argabright  FMW:[id number] DOB: Jan 24, 1955 DOA: 01/08/2024 PCP: [name], MD     Brief Narrative:   [name] is an 68 y.o. female past medical history of [name] associated with hepatic encephalopathy, depression shortness syndrome, chronic diarrhea, chronic kidney disease stage III A, anemia of chronic renal disease, hypothyroidism chronic hypotension, recently discharged from the hospital on 01/06/2024 for abdominal pain and acute encephalopathy comes into the ED for confusion   Significant studies: 01/08/2024 CT of the head showed no acute intracranial findings moderate to advanced chronic small vessel disease  Antibiotics: None  Microbiology data: Blood culture:  Procedures: None  Assessment/Plan:   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 UA no evidence of infection. Started on lactulose  4 times a day and continue on rifaximin , her ammonia level on admission was 121. Diuretic Lasix  and Aldactone  were held due to slight bump in creatinine. Send report she is waiting to see transplant surgeon. Started back on her midodrine . Still encephalopathic this morning, has had no urine output.  Acute kidney injury on chronic kidney disease, stage 3a (HCC): Baseline creatinine around 1. Aldactone  and Lasix  were held creatinine this morning is 1.5. Started on IV fluid strict I's and O's and daily weights.  Chronic hypotension: Resume home midodrine  blood pressure stable.  Chronic back pain On chronic narcotics for back pain and abdominal pain at home. She also takes Robaxin  at home. Narcotics were held due to her encephalopathy. Continue wrap Robaxin  as needed.  General Anxiety disorder: Continue reflux.  GERD: Continue PPI.  Hypothyroidism:  Continue Synthroid .  Hyperlipidemia: Continue  statins.  Chronic thrombocytopenia: Appears to be at baseline.  Restless leg syndrome: Continue Requip    DVT prophylaxis: lovenox  Family Communication:son Status is: Observation The patient will require care spanning > 2 midnights and should be moved to inpatient because: Hepatic encephalopathy    Code Status:     Code Status Orders  (From admission, onward)           Start     Ordered   01/09/24 1929  Do not attempt resuscitation (DNR) Pre-Arrest Interventions Desired  Continuous       Question Answer Comment  If pulseless and not breathing No CPR or chest compressions.   In Pre-Arrest Conditions (Patient Has Pulse and Is Breathing) May intubate, use advanced airway interventions and cardioversion/ACLS medications if appropriate or indicated. May transfer to ICU.   Consent: Discussion documented in EHR or advanced directives reviewed      01/09/24 1929           Code Status History     Date Active Date Inactive Code Status Order ID Comments User Context   01/02/2024 1537 01/06/2024 1959 Do not attempt resuscitation (DNR) PRE-ARREST INTERVENTIONS DESIRED [id number]  Zella Katha HERO, MD ED   12/20/2023 1610 12/28/2023 1506 Do not attempt resuscitation (DNR) PRE-ARREST INTERVENTIONS DESIRED [id number]  Raenelle Coria, MD ED   12/14/2023 1955 12/18/2023 1705 Do not attempt resuscitation (DNR) PRE-ARREST INTERVENTIONS DESIRED [id number]  Tobie Jorie SAUNDERS, MD ED   10/29/2023 2304 11/03/2023 1544 Limited: Do not attempt resuscitation (DNR) -DNR-LIMITED -Do Not Intubate/DNI  [id number]  Sim Emery CROME, MD ED   10/08/2023 1958 10/23/2023 1934 Full Code [id number]  Shona Terry SAILOR, DO ED   08/24/2023 2205 08/29/2023 2251 Do not attempt resuscitation (DNR) PRE-ARREST INTERVENTIONS DESIRED [id number]  Alfornia Madison,  MD ED   08/08/2023 1603 08/13/2023 2349 Do not attempt resuscitation (DNR) PRE-ARREST INTERVENTIONS DESIRED [id number]  Zella Katha HERO, MD ED   06/28/2023 2120 07/05/2023  1726 Do not attempt resuscitation (DNR) PRE-ARREST INTERVENTIONS DESIRED [id number]  Tobie Jorie SAUNDERS, MD ED   05/23/2023 0049 05/29/2023 1644 Full Code [id number]  Lee Kingfisher, MD ED   04/06/2023 0525 04/14/2023 1711 Limited: Do not attempt resuscitation (DNR) -DNR-LIMITED -Do Not Intubate/DNI  [id number]  Franky Redia SAILOR, MD ED   03/30/2023 [id number] 04/03/2023 1701 Limited: Do not attempt resuscitation (DNR) -DNR-LIMITED -Do Not Intubate/DNI  [id number]  Franky Redia SAILOR, MD ED   03/08/2023 0252 03/13/2023 2046 Limited: Do not attempt resuscitation (DNR) -DNR-LIMITED -Do Not Intubate/DNI  [id number]  Shona Terry SAILOR, DO ED   02/07/2023 0513 02/09/2023 1700 Limited: Do not attempt resuscitation (DNR) -DNR-LIMITED -Do Not Intubate/DNI  [id number]  Charlton Evalene RAMAN, MD ED   01/03/2023 0518 01/13/2023 2103 Do not attempt resuscitation (DNR) PRE-ARREST INTERVENTIONS DESIRED [id number]  Keturah Carrier, MD ED   12/10/2022 1424 12/17/2022 1551 Limited: Do not attempt resuscitation (DNR) -DNR-LIMITED -Do Not Intubate/DNI  [id number]  Celinda Alm Lot, MD ED   11/30/2022 2214 12/05/2022 1739 Limited: Do not attempt resuscitation (DNR) -DNR-LIMITED -Do Not Intubate/DNI  [id number]  Ricky Alfrieda DASEN, DO ED   10/30/2022 1051 10/31/2022 1630 Do not attempt resuscitation (DNR) PRE-ARREST INTERVENTIONS DESIRED [id number]  Tobie Yetta HERO, MD Inpatient   10/27/2022 1308 10/29/2022 1228 Full Code [id number]  Jennefer Ester PARAS, MD Inpatient   10/17/2022 1801 10/27/2022 1308 Do not attempt resuscitation (DNR) PRE-ARREST INTERVENTIONS DESIRED [id number]  Will Almarie MATSU, MD ED   09/30/2022 1247 10/12/2022 1639 Do not attempt resuscitation (DNR) PRE-ARREST INTERVENTIONS DESIRED [id number]  Celinda Alm Lot, MD ED   08/15/2022 2212 09/09/2022 1909 DNR [id number]  Rober Jodelle DASEN, MD Inpatient   08/11/2022 1109 08/15/2022 2211 DNR [id number]  Antonetta Vina NOVAK, NP ED   06/12/2022 2222 06/15/2022 1908 Full Code [id number]  Waddell Rake, MD Inpatient    05/16/2022 2308 05/22/2022 1922 Full Code [id number]  Sim Emery CROME, MD ED   05/01/2022 0022 05/10/2022 2002 Full Code [id number]  Ricky Alfrieda DASEN, DO ED         IV Access:   Peripheral IV   Procedures and diagnostic studies:   CT HEAD WO CONTRAST Result Date: 01/08/2024 EXAM: CT HEAD WITHOUT CONTRAST 01/08/2024 04:42:05 PM TECHNIQUE: CT of the head was performed without the administration of intravenous contrast. Automated exposure control, iterative reconstruction, and/or weight based adjustment of the mA/kV was utilized to reduce the radiation dose to as low as reasonably achievable. COMPARISON: Head CT 12/20/2023 and MRI 10/17/2022. CLINICAL HISTORY: Mental status change, unknown cause. FINDINGS: The examination is mildly motion degraded. BRAIN AND VENTRICLES: There is no evidence of an acute infarct, intracranial hemorrhage, mass, midline shift, hydrocephalus, or extra-axial fluid collection. Cerebral volume is within normal limits for age. Patchy hypodensities in the cerebral white matter are similar to the prior CT and nonspecific but compatible with moderate chronic small vessel ischemic disease. Calcified atherosclerosis at the skull base. ORBITS: Left cataract extraction. SINUSES: No acute abnormality. SOFT TISSUES AND SKULL: No acute soft tissue abnormality. No skull fracture. IMPRESSION: 1. No acute intracranial abnormality. 2. Moderate chronic small vessel ischemic disease. Electronically signed by: Dasie Hamburg MD 01/08/2024 04:58 PM EST RP Workstation: [id number]     Medical Consultants:   None.   Subjective:    [name]   Objective:    Vitals:  01/10/24 0247 01/10/24 0259 01/10/24 0400 01/10/24 0415  BP: (!) 112/54  (!) 99/44 (!) 105/51  Pulse: 94 85 75 78  Resp: (!) 23 18 17 16   Temp:  (!) 97.4 F (36.3 C)    TempSrc:  Oral    SpO2: 100% 100% 99% 100%  Weight:      Height:       SpO2: 100 %   Intake/Output Summary (Last 24 hours) at 01/10/2024  0616 Last data filed at 01/09/2024 1839 Gross per 24 hour  Intake 97.11 ml  Output --  Net 97.11 ml   Filed Weights   01/08/24 1558  Weight: 95.3 kg    Exam: General exam: In no acute distress. Respiratory system: Good air movement and clear to auscultation. Cardiovascular system: S1 & S2 heard, RRR. No JVD. Gastrointestinal system: Abdomen is nondistended, soft and nontender.  Extremities: No pedal edema. Skin: No rashes, lesions or ulcers Psychiatry: No judgment or insight of medical condition.   Data Reviewed:    Labs: Basic Metabolic Panel: Recent Labs  Lab 01/04/24 0459 01/06/24 0501 01/07/24 1400 01/08/24 1658 01/09/24 1748 01/09/24 2000  NA 143 141 136 138 140  --   K 3.5 3.6 3.9 4.5 4.1  --   CL 116* 112* 108 109 111  --   CO2 17* 17* 16* 18* 19*  --   GLUCOSE 124* 119* 142* 102* 115*  --   BUN 22 23 22  25* 31*  --   CREATININE 1.07* 1.26* 1.30* 1.46* 1.57*  --   CALCIUM  8.2* 8.5* 9.1 9.3 9.3  --   MG 1.8  --   --   --   --  1.8   GFR Estimated Creatinine Clearance: 37.9 mL/min (A) (by C-G formula based on SCr of 1.57 mg/dL (H)). Liver Function Tests: Recent Labs  Lab 01/04/24 0459 01/06/24 0501 01/07/24 1400 01/08/24 1658 01/09/24 1748  AST 25 26 33 59* 43*  ALT 17 16 20 24 25   ALKPHOS 119 118 145* 154* 128*  BILITOT 1.7* 1.3* 1.4* 1.7* 1.9*  PROT 4.6* 4.9* 6.2* 6.9 6.0*  ALBUMIN  2.5* 2.5* 3.3* 3.6 3.3*   No results for input(s): LIPASE, AMYLASE in the last 168 hours. Recent Labs  Lab 01/07/24 1400 01/08/24 1810 01/09/24 2000  AMMONIA 127* 121* 113*   Coagulation profile Recent Labs  Lab 01/09/24 1748  INR 1.2   COVID-19 Labs  No results for input(s): DDIMER, FERRITIN, LDH, CRP in the last 72 hours.  Lab Results  Component Value Date   SARSCOV2NAA NEGATIVE 01/02/2024   SARSCOV2NAA NEGATIVE 12/14/2023   SARSCOV2NAA NEGATIVE 10/29/2023   SARSCOV2NAA NEGATIVE 08/24/2023    CBC: Recent Labs  Lab 01/04/24 0459  01/06/24 0501 01/07/24 1400 01/08/24 1658 01/09/24 1748  WBC 6.9 5.4 6.8 8.1 7.6  NEUTROABS 4.3 2.9 3.9  --  4.9  HGB 10.4* 10.0* 12.2 13.5 11.9*  HCT 32.9* 31.6* 38.8 43.3 37.3  MCV 97.3 97.2 98.7 96.9 95.4  PLT 104* 117* 149* 173 162   Cardiac Enzymes: No results for input(s): CKTOTAL, CKMB, CKMBINDEX, TROPONINI in the last 168 hours. BNP (last 3 results) No results for input(s): PROBNP in the last 8760 hours. CBG: No results for input(s): GLUCAP in the last 168 hours. D-Dimer: No results for input(s): DDIMER in the last 72 hours. Hgb A1c: No results for input(s): HGBA1C in the last 72 hours. Lipid Profile: No results for input(s): CHOL, HDL, LDLCALC, TRIG, CHOLHDL, LDLDIRECT in the last 72 hours.  Thyroid  function studies: No results for input(s): TSH, T4TOTAL, T3FREE, THYROIDAB in the last 72 hours.  Invalid input(s): FREET3 Anemia work up: No results for input(s): VITAMINB12, FOLATE, FERRITIN, TIBC, IRON , RETICCTPCT in the last 72 hours. Sepsis Labs: Recent Labs  Lab 01/06/24 0501 01/07/24 1400 01/08/24 1658 01/09/24 1748  WBC 5.4 6.8 8.1 7.6   Microbiology Recent Results (from the past 240 hours)  Resp panel by RT-PCR (RSV, Flu A&B, Covid) Anterior Nasal Swab     Status: None   Collection Time: 01/02/24  1:33 PM   Specimen: Anterior Nasal Swab  Result Value Ref Range Status   SARS Coronavirus 2 by RT PCR NEGATIVE NEGATIVE Final    Comment: (NOTE) SARS-CoV-2 target nucleic acids are NOT DETECTED.  The SARS-CoV-2 RNA is generally detectable in upper respiratory specimens during the acute phase of infection. The lowest concentration of SARS-CoV-2 viral copies this assay can detect is 138 copies/mL. A negative result does not preclude SARS-Cov-2 infection and should not be used as the sole basis for treatment or other patient management decisions. A negative result may occur with  improper specimen  collection/handling, submission of specimen other than nasopharyngeal swab, presence of viral mutation(s) within the areas targeted by this assay, and inadequate number of viral copies(<138 copies/mL). A negative result must be combined with clinical observations, patient history, and epidemiological information. The expected result is Negative.  Fact Sheet for Patients:  bloggercourse.com  Fact Sheet for Healthcare Providers:  seriousbroker.it  This test is no t yet approved or cleared by the [place]  FDA and  has been authorized for detection and/or diagnosis of SARS-CoV-2 by FDA under an Emergency Use Authorization (EUA). This EUA will remain  in effect (meaning this test can be used) for the duration of the COVID-19 declaration under Section 564(b)(1) of the Act, 21 U.S.C.section 360bbb-3(b)(1), unless the authorization is terminated  or revoked sooner.       Influenza A by PCR NEGATIVE NEGATIVE Final   Influenza B by PCR NEGATIVE NEGATIVE Final    Comment: (NOTE) The Xpert Xpress SARS-CoV-2/FLU/RSV plus assay is intended as an aid in the diagnosis of influenza from Nasopharyngeal swab specimens and should not be used as a sole basis for treatment. Nasal washings and aspirates are unacceptable for Xpert Xpress SARS-CoV-2/FLU/RSV testing.  Fact Sheet for Patients: bloggercourse.com  Fact Sheet for Healthcare Providers: seriousbroker.it  This test is not yet approved or cleared by the [place]  FDA and has been authorized for detection and/or diagnosis of SARS-CoV-2 by FDA under an Emergency Use Authorization (EUA). This EUA will remain in effect (meaning this test can be used) for the duration of the COVID-19 declaration under Section 564(b)(1) of the Act, 21 U.S.C. section 360bbb-3(b)(1), unless the authorization is terminated or revoked.     Resp Syncytial  Virus by PCR NEGATIVE NEGATIVE Final    Comment: (NOTE) Fact Sheet for Patients: bloggercourse.com  Fact Sheet for Healthcare Providers: seriousbroker.it  This test is not yet approved or cleared by the [place]  FDA and has been authorized for detection and/or diagnosis of SARS-CoV-2 by FDA under an Emergency Use Authorization (EUA). This EUA will remain in effect (meaning this test can be used) for the duration of the COVID-19 declaration under Section 564(b)(1) of the Act, 21 U.S.C. section 360bbb-3(b)(1), unless the authorization is terminated or revoked.  Performed at [facility], [address], [place], [place] [address]      Medications:    atorvastatin   40  mg Oral q AM   cyanocobalamin   2,000 mcg Oral Q M,W,F   lactulose   30 g Oral QID   levothyroxine   175 mcg Oral QAC breakfast   magnesium  gluconate  500 mg Oral BID   megestrol   40 mg Oral BID   methocarbamol   500 mg Oral q AM   midodrine   10 mg Oral TID WC   multivitamin with minerals  1 tablet Oral Daily   pantoprazole   40 mg Oral BID AC   rifaximin   550 mg Oral BID   rOPINIRole   0.25 mg Oral QHS   senna-docusate  1 tablet Oral BID   sodium bicarbonate   650 mg Oral BID   venlafaxine  XR  75 mg Oral Q breakfast   Continuous Infusions:  cefTRIAXone  (ROCEPHIN )  IV        LOS: 0 days   Erle Odell Castor  [facility]  01/10/2024, 6:16 AM

## 2024-01-11 DIAGNOSIS — K7682 Hepatic encephalopathy: Secondary | ICD-10-CM | POA: Diagnosis not present

## 2024-01-11 DIAGNOSIS — K5900 Constipation, unspecified: Secondary | ICD-10-CM | POA: Diagnosis not present

## 2024-01-11 DIAGNOSIS — K219 Gastro-esophageal reflux disease without esophagitis: Secondary | ICD-10-CM | POA: Diagnosis not present

## 2024-01-11 LAB — COMPREHENSIVE METABOLIC PANEL WITH GFR
ALT: 16 U/L (ref 0–44)
AST: 30 U/L (ref 15–41)
Albumin: 2.5 g/dL — ABNORMAL LOW (ref 3.5–5.0)
Alkaline Phosphatase: 100 U/L (ref 38–126)
Anion gap: 9 (ref 5–15)
BUN: 22 mg/dL (ref 8–23)
CO2: 17 mmol/L — ABNORMAL LOW (ref 22–32)
Calcium: 8.5 mg/dL — ABNORMAL LOW (ref 8.9–10.3)
Chloride: 117 mmol/L — ABNORMAL HIGH (ref 98–111)
Creatinine, Ser: 1.13 mg/dL — ABNORMAL HIGH (ref 0.44–1.00)
GFR, Estimated: 53 mL/min — ABNORMAL LOW
Glucose, Bld: 102 mg/dL — ABNORMAL HIGH (ref 70–99)
Potassium: 3.2 mmol/L — ABNORMAL LOW (ref 3.5–5.1)
Sodium: 143 mmol/L (ref 135–145)
Total Bilirubin: 1.3 mg/dL — ABNORMAL HIGH (ref 0.0–1.2)
Total Protein: 4.7 g/dL — ABNORMAL LOW (ref 6.5–8.1)

## 2024-01-11 MED ORDER — LACTULOSE 10 GM/15ML PO SOLN
30.0000 g | Freq: Three times a day (TID) | ORAL | Status: DC
  Administered 2024-01-11 – 2024-01-12 (×6): 30 g via ORAL
  Filled 2024-01-11 (×6): qty 45

## 2024-01-11 MED ORDER — HYDROCODONE-ACETAMINOPHEN 5-325 MG PO TABS
1.0000 | ORAL_TABLET | Freq: Three times a day (TID) | ORAL | Status: DC | PRN
  Administered 2024-01-12 (×3): 1 via ORAL
  Filled 2024-01-11 (×3): qty 1

## 2024-01-11 MED ORDER — ZINC OXIDE 40 % EX OINT: TOPICAL_OINTMENT | Freq: Two times a day (BID) | CUTANEOUS | Status: DC | PRN

## 2024-01-11 MED ORDER — FAMOTIDINE 20 MG PO TABS: 20.0000 mg | ORAL_TABLET | Freq: Every day | ORAL | Status: DC | PRN

## 2024-01-11 MED ORDER — SODIUM BICARBONATE 650 MG PO TABS
1300.0000 mg | ORAL_TABLET | Freq: Two times a day (BID) | ORAL | Status: DC
  Administered 2024-01-11 – 2024-01-13 (×4): 1300 mg via ORAL
  Filled 2024-01-11 (×4): qty 2

## 2024-01-11 MED ORDER — POTASSIUM CHLORIDE CRYS ER 20 MEQ PO TBCR
40.0000 meq | EXTENDED_RELEASE_TABLET | Freq: Two times a day (BID) | ORAL | Status: AC
  Administered 2024-01-11 (×2): 40 meq via ORAL
  Filled 2024-01-11 (×2): qty 2

## 2024-01-11 MED ORDER — BARRIER CREAM NON-SPECIFIED: 1.0000 | TOPICAL_CREAM | Freq: Two times a day (BID) | TOPICAL | Status: DC | PRN

## 2024-01-11 MED ORDER — ENOXAPARIN SODIUM 40 MG/0.4ML IJ SOSY
40.0000 mg | PREFILLED_SYRINGE | Freq: Every day | INTRAMUSCULAR | Status: DC
  Administered 2024-01-11 – 2024-01-13 (×3): 40 mg via SUBCUTANEOUS
  Filled 2024-01-11 (×3): qty 0.4

## 2024-01-11 NOTE — Progress Notes (Signed)
 [facility] PROGRESS NOTE    Progress Note  [name]  FMW:[id number] DOB: [birth year] DOA: 01/08/2024 PCP: [name], MD     Brief Narrative:   [name] is an 69 y.o. female past medical history of [name] associated with hepatic encephalopathy, depression shortness syndrome, chronic diarrhea, chronic kidney disease stage III A, anemia of chronic renal disease, hypothyroidism chronic hypotension, recently discharged from the hospital on 01/06/2024 for abdominal pain and acute encephalopathy comes into the ED for confusion   Significant studies: 01/08/2024 CT of the head showed no acute intracranial findings moderate to advanced chronic small vessel disease  Antibiotics: None  Microbiology data: Blood culture:  Procedures: None  Assessment/Plan:   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 Mentation is significantly improved this morning, continue oral lactulose  and rifaximin . Continue to hold Lasix  and Aldactone . Cont. midodrine . Encephalopathy has resolved. I believe she will have to go home off Lasix  and continue Aldactone  as I believe dehydration played an important part of her hepatic encephalopathy. KVO IV fluids.  Acute kidney injury on chronic kidney disease, stage 3a (HCC): Baseline creatinine around 1. Aldactone  and Lasix  were held, started on IV fluid creatinine this morning is 1.1. KVO IV fluids will have to go home off diuretic therapy specifically the Lasix . Can probably start her on low-dose Bactrim  as an outpatient.  Hypokalemia: Replete orally recheck in the morning, try to keep potassium greater than 4.  Chronic hypotension: Continue home midodrine  blood pressure stable.  Chronic back pain On chronic narcotics for back pain and abdominal pain at home. She also takes Robaxin  at home. Continue Robaxin  as needed.  General Anxiety disorder: Continue  reflux.  GERD: Continue PPI.  Hypothyroidism:  Continue Synthroid .  Hyperlipidemia: Continue statins.  Chronic thrombocytopenia: Appears to be at baseline.  Restless leg syndrome: Continue Requip    DVT prophylaxis: lovenox  Family Communication:son Status is: Observation The patient will require care spanning > 2 midnights and should be moved to inpatient because: Hepatic encephalopathy    Code Status:     Code Status Orders  (From admission, onward)           Start     Ordered   01/09/24 1929  Do not attempt resuscitation (DNR) Pre-Arrest Interventions Desired  Continuous       Question Answer Comment  If pulseless and not breathing No CPR or chest compressions.   In Pre-Arrest Conditions (Patient Has Pulse and Is Breathing) May intubate, use advanced airway interventions and cardioversion/ACLS medications if appropriate or indicated. May transfer to ICU.   Consent: Discussion documented in EHR or advanced directives reviewed      01/09/24 1929           Code Status History     Date Active Date Inactive Code Status Order ID Comments User Context   01/02/2024 1537 01/06/2024 1959 Do not attempt resuscitation (DNR) PRE-ARREST INTERVENTIONS DESIRED [id number]  Zella Katha HERO, MD ED   12/20/2023 1610 12/28/2023 1506 Do not attempt resuscitation (DNR) PRE-ARREST INTERVENTIONS DESIRED [id number]  Raenelle Coria, MD ED   12/14/2023 1955 12/18/2023 1705 Do not attempt resuscitation (DNR) PRE-ARREST INTERVENTIONS DESIRED [id number]  Tobie Jorie SAUNDERS, MD ED   10/29/2023 2304 11/03/2023 1544 Limited: Do not attempt resuscitation (DNR) -DNR-LIMITED -Do Not Intubate/DNI  [id number]  Sim Emery CROME, MD ED   10/08/2023 1958 10/23/2023 1934 Full Code [id number]  Shona Terry SAILOR, DO ED   08/24/2023 2205 08/29/2023 2251 Do not attempt resuscitation (  DNR) PRE-ARREST INTERVENTIONS DESIRED [id number]  Alfornia Madison, MD ED   08/08/2023 1603 08/13/2023 2349 Do not attempt resuscitation  (DNR) PRE-ARREST INTERVENTIONS DESIRED [id number]  Zella Katha HERO, MD ED   06/28/2023 2120 07/05/2023 1726 Do not attempt resuscitation (DNR) PRE-ARREST INTERVENTIONS DESIRED [id number]  Tobie Jorie SAUNDERS, MD ED   05/23/2023 0049 05/29/2023 1644 Full Code [id number]  Lee Kingfisher, MD ED   04/06/2023 0525 04/14/2023 1711 Limited: Do not attempt resuscitation (DNR) -DNR-LIMITED -Do Not Intubate/DNI  [id number]  Franky Redia SAILOR, MD ED   03/30/2023 [phone] 04/03/2023 1701 Limited: Do not attempt resuscitation (DNR) -DNR-LIMITED -Do Not Intubate/DNI  [id number]  Franky Redia SAILOR, MD ED   03/08/2023 0252 03/13/2023 2046 Limited: Do not attempt resuscitation (DNR) -DNR-LIMITED -Do Not Intubate/DNI  [id number]  Shona Terry SAILOR, DO ED   02/07/2023 0513 02/09/2023 1700 Limited: Do not attempt resuscitation (DNR) -DNR-LIMITED -Do Not Intubate/DNI  [id number]  Charlton Evalene RAMAN, MD ED   01/03/2023 0518 01/13/2023 2103 Do not attempt resuscitation (DNR) PRE-ARREST INTERVENTIONS DESIRED [id number]  Keturah Carrier, MD ED   12/10/2022 1424 12/17/2022 1551 Limited: Do not attempt resuscitation (DNR) -DNR-LIMITED -Do Not Intubate/DNI  [id number]  Celinda Alm Lot, MD ED   11/30/2022 2214 12/05/2022 1739 Limited: Do not attempt resuscitation (DNR) -DNR-LIMITED -Do Not Intubate/DNI  [id number]  Ricky Pax T, DO ED   10/30/2022 1051 10/31/2022 1630 Do not attempt resuscitation (DNR) PRE-ARREST INTERVENTIONS DESIRED [id number]  Tobie Yetta HERO, MD Inpatient   10/27/2022 1308 10/29/2022 1228 Full Code [id number]  Jennefer Ester PARAS, MD Inpatient   10/17/2022 1801 10/27/2022 1308 Do not attempt resuscitation (DNR) PRE-ARREST INTERVENTIONS DESIRED [id number]  Will Almarie MATSU, MD ED   09/30/2022 1247 10/12/2022 1639 Do not attempt resuscitation (DNR) PRE-ARREST INTERVENTIONS DESIRED [id number]  Celinda Alm Lot, MD ED   08/15/2022 2212 09/09/2022 1909 DNR [id number]  Rober Jodelle DASEN, MD Inpatient   08/11/2022 1109 08/15/2022 2211 DNR [id number]   Antonetta Vina NOVAK, NP ED   06/12/2022 2222 06/15/2022 1908 Full Code [id number]  Waddell Rake, MD Inpatient   05/16/2022 2308 05/22/2022 1922 Full Code [id number]  Sim Emery CROME, MD ED   05/01/2022 0022 05/10/2022 2002 Full Code [id number]  Ricky Pax DASEN, DO ED         IV Access:   Peripheral IV   Procedures and diagnostic studies:   No results found.    Medical Consultants:   None.   Subjective:    [name] she is more awake today  Objective:    Vitals:   01/10/24 1948 01/11/24 0020 01/11/24 0356 01/11/24 0500  BP: 130/67 (!) 117/56 (!) 124/59   Pulse: 83 74 78   Resp: 20     Temp: 98.6 F (37 C) 99 F (37.2 C) 98.8 F (37.1 C)   TempSrc: Oral Oral Oral   SpO2: 100% 100% 99%   Weight:    90.4 kg  Height:       SpO2: 99 %   Intake/Output Summary (Last 24 hours) at 01/11/2024 1000 Last data filed at 01/10/2024 1600 Gross per 24 hour  Intake 0 ml  Output --  Net 0 ml   Filed Weights   01/08/24 1558 01/10/24 1630 01/11/24 0500  Weight: 95.3 kg 95.2 kg 90.4 kg    Exam: General exam: In no acute distress. Respiratory system: Good air movement and clear to auscultation. Cardiovascular system: S1 & S2 heard, RRR. No JVD. Gastrointestinal system: Abdomen is nondistended, soft and nontender.  Extremities: No pedal  edema. Skin: No rashes, lesions or ulcers Psychiatry: Judgment and insight appears to be improved her mentation is improving this morning.  Data Reviewed:    Labs: Basic Metabolic Panel: Recent Labs  Lab 01/07/24 1400 01/08/24 1658 01/09/24 1748 01/09/24 2000 01/10/24 0500 01/11/24 0606  NA 136 138 140  --  144 143  K 3.9 4.5 4.1  --  3.6 3.2*  CL 108 109 111  --  118* 117*  CO2 16* 18* 19*  --  18* 17*  GLUCOSE 142* 102* 115*  --  91 102*  BUN 22 25* 31*  --  27* 22  CREATININE 1.30* 1.46* 1.57*  --  1.22* 1.13*  CALCIUM  9.1 9.3 9.3  --  8.5* 8.5*  MG  --   --   --  1.8  --   --    GFR Estimated Creatinine Clearance: 51.2  mL/min (A) (by C-G formula based on SCr of 1.13 mg/dL (H)). Liver Function Tests: Recent Labs  Lab 01/07/24 1400 01/08/24 1658 01/09/24 1748 01/10/24 0500 01/11/24 0606  AST 33 59* 43* 34 30  ALT 20 24 25 19 16   ALKPHOS 145* 154* 128* 104 100  BILITOT 1.4* 1.7* 1.9* 1.9* 1.3*  PROT 6.2* 6.9 6.0* 4.9* 4.7*  ALBUMIN  3.3* 3.6 3.3* 2.8* 2.5*   No results for input(s): LIPASE, AMYLASE in the last 168 hours. Recent Labs  Lab 01/07/24 1400 01/08/24 1810 01/09/24 2000 01/10/24 0500  AMMONIA 127* 121* 113* 87*   Coagulation profile Recent Labs  Lab 01/09/24 1748  INR 1.2   COVID-19 Labs  No results for input(s): DDIMER, FERRITIN, LDH, CRP in the last 72 hours.  Lab Results  Component Value Date   SARSCOV2NAA NEGATIVE 01/02/2024   SARSCOV2NAA NEGATIVE 12/14/2023   SARSCOV2NAA NEGATIVE 10/29/2023   SARSCOV2NAA NEGATIVE 08/24/2023    CBC: Recent Labs  Lab 01/06/24 0501 01/07/24 1400 01/08/24 1658 01/09/24 1748 01/10/24 0500  WBC 5.4 6.8 8.1 7.6 5.5  NEUTROABS 2.9 3.9  --  4.9  --   HGB 10.0* 12.2 13.5 11.9* 10.7*  HCT 31.6* 38.8 43.3 37.3 33.2*  MCV 97.2 98.7 96.9 95.4 96.5  PLT 117* 149* 173 162 111*   Cardiac Enzymes: No results for input(s): CKTOTAL, CKMB, CKMBINDEX, TROPONINI in the last 168 hours. BNP (last 3 results) No results for input(s): PROBNP in the last 8760 hours. CBG: No results for input(s): GLUCAP in the last 168 hours. D-Dimer: No results for input(s): DDIMER in the last 72 hours. Hgb A1c: No results for input(s): HGBA1C in the last 72 hours. Lipid Profile: No results for input(s): CHOL, HDL, LDLCALC, TRIG, CHOLHDL, LDLDIRECT in the last 72 hours. Thyroid  function studies: No results for input(s): TSH, T4TOTAL, T3FREE, THYROIDAB in the last 72 hours.  Invalid input(s): FREET3 Anemia work up: No results for input(s): VITAMINB12, FOLATE, FERRITIN, TIBC, IRON , RETICCTPCT in the  last 72 hours. Sepsis Labs: Recent Labs  Lab 01/07/24 1400 01/08/24 1658 01/09/24 1748 01/10/24 0500  WBC 6.8 8.1 7.6 5.5   Microbiology Recent Results (from the past 240 hours)  Resp panel by RT-PCR (RSV, Flu A&B, Covid) Anterior Nasal Swab     Status: None   Collection Time: 01/02/24  1:33 PM   Specimen: Anterior Nasal Swab  Result Value Ref Range Status   SARS Coronavirus 2 by RT PCR NEGATIVE NEGATIVE Final    Comment: (NOTE) SARS-CoV-2 target nucleic acids are NOT DETECTED.  The SARS-CoV-2 RNA is generally detectable in upper respiratory specimens  during the acute phase of infection. The lowest concentration of SARS-CoV-2 viral copies this assay can detect is 138 copies/mL. A negative result does not preclude SARS-Cov-2 infection and should not be used as the sole basis for treatment or other patient management decisions. A negative result may occur with  improper specimen collection/handling, submission of specimen other than nasopharyngeal swab, presence of viral mutation(s) within the areas targeted by this assay, and inadequate number of viral copies(<138 copies/mL). A negative result must be combined with clinical observations, patient history, and epidemiological information. The expected result is Negative.  Fact Sheet for Patients:  bloggercourse.com  Fact Sheet for Healthcare Providers:  seriousbroker.it  This test is no t yet approved or cleared by the [place]  FDA and  has been authorized for detection and/or diagnosis of SARS-CoV-2 by FDA under an Emergency Use Authorization (EUA). This EUA will remain  in effect (meaning this test can be used) for the duration of the COVID-19 declaration under Section 564(b)(1) of the Act, 21 U.S.C.section 360bbb-3(b)(1), unless the authorization is terminated  or revoked sooner.       Influenza A by PCR NEGATIVE NEGATIVE Final   Influenza B by PCR NEGATIVE  NEGATIVE Final    Comment: (NOTE) The Xpert Xpress SARS-CoV-2/FLU/RSV plus assay is intended as an aid in the diagnosis of influenza from Nasopharyngeal swab specimens and should not be used as a sole basis for treatment. Nasal washings and aspirates are unacceptable for Xpert Xpress SARS-CoV-2/FLU/RSV testing.  Fact Sheet for Patients: bloggercourse.com  Fact Sheet for Healthcare Providers: seriousbroker.it  This test is not yet approved or cleared by the [place]  FDA and has been authorized for detection and/or diagnosis of SARS-CoV-2 by FDA under an Emergency Use Authorization (EUA). This EUA will remain in effect (meaning this test can be used) for the duration of the COVID-19 declaration under Section 564(b)(1) of the Act, 21 U.S.C. section 360bbb-3(b)(1), unless the authorization is terminated or revoked.     Resp Syncytial Virus by PCR NEGATIVE NEGATIVE Final    Comment: (NOTE) Fact Sheet for Patients: bloggercourse.com  Fact Sheet for Healthcare Providers: seriousbroker.it  This test is not yet approved or cleared by the [place]  FDA and has been authorized for detection and/or diagnosis of SARS-CoV-2 by FDA under an Emergency Use Authorization (EUA). This EUA will remain in effect (meaning this test can be used) for the duration of the COVID-19 declaration under Section 564(b)(1) of the Act, 21 U.S.C. section 360bbb-3(b)(1), unless the authorization is terminated or revoked.  Performed at [facility], [address] [place], [place] [address]      Medications:    atorvastatin   40 mg Oral q AM   cyanocobalamin   2,000 mcg Oral Q M,W,F   lactulose   30 g Oral QID   levothyroxine   175 mcg Oral QAC breakfast   magnesium  gluconate  500 mg Oral BID   megestrol   40 mg Oral BID   methocarbamol   500 mg Oral q AM   midodrine   10 mg Oral  TID WC   multivitamin with minerals  1 tablet Oral Daily   pantoprazole   40 mg Oral BID AC   rifaximin   550 mg Oral BID   rOPINIRole   0.25 mg Oral QHS   senna-docusate  1 tablet Oral BID   sodium bicarbonate   650 mg Oral BID   venlafaxine  XR  75 mg Oral Q breakfast   white petrolatum    Topical BID   Continuous Infusions:  cefTRIAXone  (ROCEPHIN )  IV 1 g (01/10/24 1816)      LOS: 1 day   Erle Odell Castor  [facility]  01/11/2024, 10:00 AM

## 2024-01-11 NOTE — Plan of Care (Signed)
  Problem: Education: Goal: Knowledge of General Education information will improve Description: Including pain rating scale, medication(s)/side effects and non-pharmacologic comfort measures Outcome: Progressing   Problem: Health Behavior/Discharge Planning: Goal: Ability to manage health-related needs will improve Outcome: Progressing   Problem: Clinical Measurements: Goal: Ability to maintain clinical measurements within normal limits will improve Outcome: Progressing Goal: Will remain free from infection Outcome: Progressing Goal: Diagnostic test results will improve Outcome: Progressing Goal: Respiratory complications will improve Outcome: Progressing   Problem: Activity: Goal: Risk for activity intolerance will decrease Outcome: Progressing   Problem: Elimination: Goal: Will not experience complications related to bowel motility Outcome: Progressing Goal: Will not experience complications related to urinary retention Outcome: Progressing   Problem: Pain Managment: Goal: General experience of comfort will improve and/or be controlled Outcome: Progressing   Problem: Skin Integrity: Goal: Risk for impaired skin integrity will decrease Outcome: Progressing

## 2024-01-11 NOTE — Evaluation (Signed)
 Occupational Therapy Evaluation Patient Details Name: [name] MRN: [id number] DOB: Dec 13, 1955 Today's Date: 01/11/2024   History of Present Illness   69 y.o. female with medical history significant of NASH cirrhosis and associated hepatic encephalopathy, depression, short gut syndrome with chronic diarrhea, CKD stage 3A, ACD, hypothyroidism, chronic hypotension who presented to ED with confusion. She was recently admitted 12/23-12/27 for same complaints.     Clinical Impressions Pt admitted with the above concerns. Pt currently with functional limitations due to the deficits listed below (see OT Problem List). Prior to admit, pt was living at home with her Son who was assisting with ADL's as needed. Pt was referred to home health therapies after last admission although did not get a chance to start. Pt will benefit from acute skilled OT to increase their safety and independence with ADL and functional mobility for ADL to facilitate discharge. Recommend pt discharge home with Son assisting and begin home health OT services.       If plan is discharge home, recommend the following:   A little help with walking and/or transfers;A little help with bathing/dressing/bathroom;Assist for transportation;Help with stairs or ramp for entrance;Supervision due to cognitive status     Functional Status Assessment   Patient has had a recent decline in their functional status and demonstrates the ability to make significant improvements in function in a reasonable and predictable amount of time.     Equipment Recommendations   None recommended by OT      Precautions/Restrictions   Precautions Precautions: Fall Recall of Precautions/Restrictions: Impaired Precaution/Restrictions Comments: L eye blind Restrictions Weight Bearing Restrictions Per Provider Order: No     Mobility Bed Mobility Overal bed mobility: Needs Assistance Bed Mobility: Supine to Sit, Sit to Supine      Supine to sit: Supervision, HOB elevated, Used rails Sit to supine: Supervision   General bed mobility comments: Completed with increased time    Transfers Overall transfer level: Needs assistance Equipment used: Rolling walker (2 wheels) Transfers: Sit to/from Stand, Bed to chair/wheelchair/BSC Sit to Stand: Supervision     Step pivot transfers: Supervision            Balance Overall balance assessment: Needs assistance Sitting-balance support: Bilateral upper extremity supported, Feet supported Sitting balance-Leahy Scale: Fair     Standing balance support: Bilateral upper extremity supported, During functional activity Standing balance-Leahy Scale: Fair        ADL either performed or assessed with clinical judgement   ADL   Eating/Feeding: Modified independent   Grooming: Set up;Sitting   Upper Body Bathing: Supervision/ safety;Set up;Sitting       Upper Body Dressing : Set up;Sitting       Toilet Transfer: Supervision/safety;Rolling walker (2 wheels);BSC/3in1;Stand-pivot Toilet Transfer Details (indicate cue type and reason): VC for safety Toileting- Clothing Manipulation and Hygiene: Total assistance;Sit to/from stand (pt requested assistance)       Functional mobility during ADLs: Supervision/safety;Rolling walker (2 wheels);Cueing for safety       Vision Baseline Vision/History: 2 Legally blind (L eye) Ability to See in Adequate Light: 2 Moderately impaired Patient Visual Report: No change from baseline Vision Assessment?: No apparent visual deficits     Perception Perception: Not tested       Praxis Praxis: Not tested       Pertinent Vitals/Pain Pain Assessment Pain Assessment: Faces Faces Pain Scale: Hurts worst Pain Location: buttocks during toilet hygiene and when sitting Pain Descriptors / Indicators: Discomfort, Aching, Crying Pain Intervention(s): Limited activity within  patient's tolerance, Repositioned, Monitored during  session     Extremity/Trunk Assessment Upper Extremity Assessment Upper Extremity Assessment: Generalized weakness   Lower Extremity Assessment Lower Extremity Assessment: Generalized weakness   Cervical / Trunk Assessment Cervical / Trunk Assessment: Kyphotic   Communication Communication Communication: No apparent difficulties   Cognition Arousal: Alert Behavior During Therapy: Lability Cognition: History of cognitive impairments    Following commands: Intact       Cueing  General Comments   Cueing Techniques: Verbal cues  Son present and supportive during evaluation           Home Living Family/patient expects to be discharged to:: Private residence Living Arrangements: Children (Son: [name]) Available Help at Discharge: Family;Available 24 hours/day Type of Home: House Home Access: Ramped entrance     Home Layout: One level     Bathroom Shower/Tub: Walk-in shower;Sponge bathes at baseline   Bathroom Toilet: Standard Bathroom Accessibility: Yes How Accessible: Accessible via walker Home Equipment: Rollator (4 wheels);Rolling Walker (2 wheels);BSC/3in1;Other (comment);Hospital bed;Wheelchair - manual;Adaptive equipment;Hand held Clinical Biochemist: Ship Broker Additional Comments: Pt lives with son [name] - very supportive, provides assist as needed; [name] present bedside for evaluation      Prior Functioning/Environment Prior Level of Function : Needs assist       Physical Assist : ADLs (physical);Mobility (physical)     Mobility Comments: son typically present for all mobility; uses RW in home or holds to son's hands for in home ambulation, manual WC outside home. Son reports that she is able to ambulate without assistance for very short distances. ADLs Comments: son assisting wtih ADLs as needed    OT Problem List: Decreased activity tolerance;Decreased strength;Impaired balance (sitting and/or standing)   OT  Treatment/Interventions: Self-care/ADL training;Therapeutic exercise;Energy conservation;DME and/or AE instruction;Therapeutic activities;Patient/family education;Balance training      OT Goals(Current goals can be found in the care plan section)   Acute Rehab OT Goals Patient Stated Goal: to use BSC OT Goal Formulation: With patient Time For Goal Achievement: 01/25/24 Potential to Achieve Goals: Good   OT Frequency:  Min 2X/week       AM-PAC OT 6 Clicks Daily Activity     Outcome Measure Help from another person eating meals?: None Help from another person taking care of personal grooming?: A Little Help from another person toileting, which includes using toliet, bedpan, or urinal?: A Lot Help from another person bathing (including washing, rinsing, drying)?: A Little Help from another person to put on and taking off regular upper body clothing?: A Little Help from another person to put on and taking off regular lower body clothing?: A Little 6 Click Score: 18   End of Session Equipment Utilized During Treatment: Rolling walker (2 wheels) Nurse Communication: Mobility status  Activity Tolerance: Patient tolerated treatment well;Patient limited by pain Patient left: in bed;with call bell/phone within reach;with bed alarm set;with family/visitor present  OT Visit Diagnosis: Muscle weakness (generalized) (M62.81);Unsteadiness on feet (R26.81)                Time: 8883-8846 OT Time Calculation (min): 37 min Charges:  OT General Charges $OT Visit: 1 Visit OT Evaluation $OT Eval Low Complexity: 1 Low OT Treatments $Self Care/Home Management : 8-22 mins  Leita Howell, OTR/L,CBIS  Supplemental OT - [facility] Secure Chat Preferred    Amel Kitch, Leita BIRCH 01/11/2024, 2:10 PM

## 2024-01-12 DIAGNOSIS — K7682 Hepatic encephalopathy: Secondary | ICD-10-CM | POA: Diagnosis not present

## 2024-01-12 LAB — BASIC METABOLIC PANEL WITH GFR
Anion gap: 7 (ref 5–15)
BUN: 19 mg/dL (ref 8–23)
CO2: 18 mmol/L — ABNORMAL LOW (ref 22–32)
Calcium: 8.3 mg/dL — ABNORMAL LOW (ref 8.9–10.3)
Chloride: 116 mmol/L — ABNORMAL HIGH (ref 98–111)
Creatinine, Ser: 1.11 mg/dL — ABNORMAL HIGH (ref 0.44–1.00)
GFR, Estimated: 55 mL/min — ABNORMAL LOW
Glucose, Bld: 104 mg/dL — ABNORMAL HIGH (ref 70–99)
Potassium: 3.7 mmol/L (ref 3.5–5.1)
Sodium: 142 mmol/L (ref 135–145)

## 2024-01-12 MED ORDER — SPIRONOLACTONE 25 MG PO TABS
50.0000 mg | ORAL_TABLET | Freq: Every day | ORAL | Status: DC
  Administered 2024-01-12 – 2024-01-13 (×2): 50 mg via ORAL
  Filled 2024-01-12 (×2): qty 2

## 2024-01-12 NOTE — Progress Notes (Signed)
 New OT order dated 01/12/24 although pt was evaluated by this OT on 01/11/24. No new change in status. Plan of care continues to be appropriate and OT will continue to work with patient.   Leita Howell, OTR/L,CBIS  Supplemental OT - [facility] Secure Chat Preferred

## 2024-01-12 NOTE — Progress Notes (Signed)
 [facility] PROGRESS NOTE    Progress Note  [name]  FMW:[id number] DOB: 11-23-1955 DOA: 01/08/2024 PCP: [name], MD     Brief Narrative:   [name] is an 69 y.o. female past medical history of [name] associated with hepatic encephalopathy, depression shortness syndrome, chronic diarrhea, chronic kidney disease stage III A, anemia of chronic renal disease, hypothyroidism chronic hypotension, recently discharged from the hospital on 01/06/2024 for abdominal pain and acute encephalopathy comes into the ED for confusion   Significant studies: 01/08/2024 CT of the head showed no acute intracranial findings moderate to advanced chronic small vessel disease  Antibiotics: None  Microbiology data: Blood culture:  Procedures: None  Assessment/Plan:   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 Mentation is significantly improved this morning, continue oral lactulose  and rifaximin . Continue to hold Lasix , probably have to go home on Lasix . Cont. midodrine . Encephalopathy has resolved. I believe dehydration played an important part of her hepatic encephalopathy. Restarted Aldactone . PT OT has been consulted.  Acute kidney injury on chronic kidney disease, stage 3a (HCC): Baseline creatinine around 1. Aldactone  and Lasix  were held, started on IV fluid creatinine this morning is 1.1. KVO IV fluids will have to go home off diuretic therapy specifically the Lasix . Can probably start her on low-dose Aldactone  as an outpatient.  Hypokalemia: Try to keep potassium greater than 4. Basic metabolic panels pending this morning. Restart Aldactone .  Chronic hypotension: Continue home midodrine  blood pressure stable.  Chronic back pain On chronic narcotics for back pain and abdominal pain at home. She also takes Robaxin  at home. Continue Robaxin  as needed.  General Anxiety disorder: Continue  reflux.  GERD: Continue PPI.  Hypothyroidism:  Continue Synthroid .  Hyperlipidemia: Continue statins.  Chronic thrombocytopenia: Appears to be at baseline.  Restless leg syndrome: Continue Requip    DVT prophylaxis: lovenox  Family Communication:son Status is: Observation The patient will require care spanning > 2 midnights and should be moved to inpatient because: Hepatic encephalopathy    Code Status:     Code Status Orders  (From admission, onward)           Start     Ordered   01/09/24 1929  Do not attempt resuscitation (DNR) Pre-Arrest Interventions Desired  Continuous       Question Answer Comment  If pulseless and not breathing No CPR or chest compressions.   In Pre-Arrest Conditions (Patient Has Pulse and Is Breathing) May intubate, use advanced airway interventions and cardioversion/ACLS medications if appropriate or indicated. May transfer to ICU.   Consent: Discussion documented in EHR or advanced directives reviewed      01/09/24 1929           Code Status History     Date Active Date Inactive Code Status Order ID Comments User Context   01/02/2024 1537 01/06/2024 1959 Do not attempt resuscitation (DNR) PRE-ARREST INTERVENTIONS DESIRED [id number]  Zella Katha HERO, MD ED   12/20/2023 1610 12/28/2023 1506 Do not attempt resuscitation (DNR) PRE-ARREST INTERVENTIONS DESIRED [id number]  Raenelle Coria, MD ED   12/14/2023 1955 12/18/2023 1705 Do not attempt resuscitation (DNR) PRE-ARREST INTERVENTIONS DESIRED [id number]  Tobie Jorie SAUNDERS, MD ED   10/29/2023 2304 11/03/2023 1544 Limited: Do not attempt resuscitation (DNR) -DNR-LIMITED -Do Not Intubate/DNI  [id number]  Sim Emery CROME, MD ED   10/08/2023 1958 10/23/2023 1934 Full Code [id number]  Shona Terry SAILOR, DO ED   08/24/2023 2205 08/29/2023 2251 Do not attempt resuscitation (DNR) PRE-ARREST INTERVENTIONS  DESIRED [id number]  Alfornia Madison, MD ED   08/08/2023 1603 08/13/2023 2349 Do not attempt resuscitation  (DNR) PRE-ARREST INTERVENTIONS DESIRED [id number]  Zella Katha HERO, MD ED   06/28/2023 2120 07/05/2023 1726 Do not attempt resuscitation (DNR) PRE-ARREST INTERVENTIONS DESIRED [id number]  Tobie Jorie SAUNDERS, MD ED   05/23/2023 0049 05/29/2023 1644 Full Code [id number]  Lee Kingfisher, MD ED   04/06/2023 0525 04/14/2023 1711 Limited: Do not attempt resuscitation (DNR) -DNR-LIMITED -Do Not Intubate/DNI  [id number]  Franky Redia SAILOR, MD ED   03/30/2023 [phone] 04/03/2023 1701 Limited: Do not attempt resuscitation (DNR) -DNR-LIMITED -Do Not Intubate/DNI  [id number]  Franky Redia SAILOR, MD ED   03/08/2023 0252 03/13/2023 2046 Limited: Do not attempt resuscitation (DNR) -DNR-LIMITED -Do Not Intubate/DNI  [id number]  Shona Terry SAILOR, DO ED   02/07/2023 0513 02/09/2023 1700 Limited: Do not attempt resuscitation (DNR) -DNR-LIMITED -Do Not Intubate/DNI  [id number]  Charlton Evalene RAMAN, MD ED   01/03/2023 0518 01/13/2023 2103 Do not attempt resuscitation (DNR) PRE-ARREST INTERVENTIONS DESIRED [id number]  Keturah Carrier, MD ED   12/10/2022 1424 12/17/2022 1551 Limited: Do not attempt resuscitation (DNR) -DNR-LIMITED -Do Not Intubate/DNI  [id number]  Celinda Alm Lot, MD ED   11/30/2022 2214 12/05/2022 1739 Limited: Do not attempt resuscitation (DNR) -DNR-LIMITED -Do Not Intubate/DNI  [id number]  Ricky Alfrieda DASEN, DO ED   10/30/2022 1051 10/31/2022 1630 Do not attempt resuscitation (DNR) PRE-ARREST INTERVENTIONS DESIRED [id number]  Tobie Yetta HERO, MD Inpatient   10/27/2022 1308 10/29/2022 1228 Full Code [id number]  Jennefer Ester PARAS, MD Inpatient   10/17/2022 1801 10/27/2022 1308 Do not attempt resuscitation (DNR) PRE-ARREST INTERVENTIONS DESIRED [id number]  Will Almarie MATSU, MD ED   09/30/2022 1247 10/12/2022 1639 Do not attempt resuscitation (DNR) PRE-ARREST INTERVENTIONS DESIRED [id number]  Celinda Alm Lot, MD ED   08/15/2022 2212 09/09/2022 1909 DNR [id number]  Rober Jodelle DASEN, MD Inpatient   08/11/2022 1109 08/15/2022 2211 DNR [id number]   Antonetta Vina NOVAK, NP ED   06/12/2022 2222 06/15/2022 1908 Full Code [id number]  Waddell Rake, MD Inpatient   05/16/2022 2308 05/22/2022 1922 Full Code [id number]  Sim Emery CROME, MD ED   05/01/2022 0022 05/10/2022 2002 Full Code [id number]  Ricky Alfrieda DASEN, DO ED         IV Access:   Peripheral IV   Procedures and diagnostic studies:   No results found.    Medical Consultants:   None.   Subjective:    [name] she is more awake today  Objective:    Vitals:   01/11/24 1331 01/11/24 2158 01/12/24 0449 01/12/24 0500  BP: 128/83 (!) 132/44 121/61   Pulse: 87 75 70   Resp: 19 18 18    Temp: 98 F (36.7 C) 98.4 F (36.9 C) 98.1 F (36.7 C)   TempSrc:  Oral    SpO2: 100% 98% 100%   Weight:    94.2 kg  Height:       SpO2: 100 %   Intake/Output Summary (Last 24 hours) at 01/12/2024 0806 Last data filed at 01/11/2024 1500 Gross per 24 hour  Intake 100 ml  Output --  Net 100 ml   Filed Weights   01/10/24 1630 01/11/24 0500 01/12/24 0500  Weight: 95.2 kg 90.4 kg 94.2 kg    Exam: General exam: In no acute distress. Respiratory system: Good air movement and clear to auscultation. Cardiovascular system: S1 & S2 heard, RRR. No JVD. Gastrointestinal system: Abdomen is nondistended, soft and nontender.  Extremities: No pedal edema. Skin: No rashes,  lesions or ulcers Psychiatry: Judgement and insight appear intact able to have conversation mood and affect are appropriate.  She is awake alert and oriented x 3 nonfocal  Data Reviewed:    Labs: Basic Metabolic Panel: Recent Labs  Lab 01/07/24 1400 01/08/24 1658 01/09/24 1748 01/09/24 2000 01/10/24 0500 01/11/24 0606  NA 136 138 140  --  144 143  K 3.9 4.5 4.1  --  3.6 3.2*  CL 108 109 111  --  118* 117*  CO2 16* 18* 19*  --  18* 17*  GLUCOSE 142* 102* 115*  --  91 102*  BUN 22 25* 31*  --  27* 22  CREATININE 1.30* 1.46* 1.57*  --  1.22* 1.13*  CALCIUM  9.1 9.3 9.3  --  8.5* 8.5*  MG  --   --   --  1.8  --    --    GFR Estimated Creatinine Clearance: 52.3 mL/min (A) (by C-G formula based on SCr of 1.13 mg/dL (H)). Liver Function Tests: Recent Labs  Lab 01/07/24 1400 01/08/24 1658 01/09/24 1748 01/10/24 0500 01/11/24 0606  AST 33 59* 43* 34 30  ALT 20 24 25 19 16   ALKPHOS 145* 154* 128* 104 100  BILITOT 1.4* 1.7* 1.9* 1.9* 1.3*  PROT 6.2* 6.9 6.0* 4.9* 4.7*  ALBUMIN  3.3* 3.6 3.3* 2.8* 2.5*   No results for input(s): LIPASE, AMYLASE in the last 168 hours. Recent Labs  Lab 01/07/24 1400 01/08/24 1810 01/09/24 2000 01/10/24 0500  AMMONIA 127* 121* 113* 87*   Coagulation profile Recent Labs  Lab 01/09/24 1748  INR 1.2   COVID-19 Labs  No results for input(s): DDIMER, FERRITIN, LDH, CRP in the last 72 hours.  Lab Results  Component Value Date   SARSCOV2NAA NEGATIVE 01/02/2024   SARSCOV2NAA NEGATIVE 12/14/2023   SARSCOV2NAA NEGATIVE 10/29/2023   SARSCOV2NAA NEGATIVE 08/24/2023    CBC: Recent Labs  Lab 01/06/24 0501 01/07/24 1400 01/08/24 1658 01/09/24 1748 01/10/24 0500  WBC 5.4 6.8 8.1 7.6 5.5  NEUTROABS 2.9 3.9  --  4.9  --   HGB 10.0* 12.2 13.5 11.9* 10.7*  HCT 31.6* 38.8 43.3 37.3 33.2*  MCV 97.2 98.7 96.9 95.4 96.5  PLT 117* 149* 173 162 111*   Cardiac Enzymes: No results for input(s): CKTOTAL, CKMB, CKMBINDEX, TROPONINI in the last 168 hours. BNP (last 3 results) No results for input(s): PROBNP in the last 8760 hours. CBG: No results for input(s): GLUCAP in the last 168 hours. D-Dimer: No results for input(s): DDIMER in the last 72 hours. Hgb A1c: No results for input(s): HGBA1C in the last 72 hours. Lipid Profile: No results for input(s): CHOL, HDL, LDLCALC, TRIG, CHOLHDL, LDLDIRECT in the last 72 hours. Thyroid  function studies: No results for input(s): TSH, T4TOTAL, T3FREE, THYROIDAB in the last 72 hours.  Invalid input(s): FREET3 Anemia work up: No results for input(s): VITAMINB12,  FOLATE, FERRITIN, TIBC, IRON , RETICCTPCT in the last 72 hours. Sepsis Labs: Recent Labs  Lab 01/07/24 1400 01/08/24 1658 01/09/24 1748 01/10/24 0500  WBC 6.8 8.1 7.6 5.5   Microbiology Recent Results (from the past 240 hours)  Resp panel by RT-PCR (RSV, Flu A&B, Covid) Anterior Nasal Swab     Status: None   Collection Time: 01/02/24  1:33 PM   Specimen: Anterior Nasal Swab  Result Value Ref Range Status   SARS Coronavirus 2 by RT PCR NEGATIVE NEGATIVE Final    Comment: (NOTE) SARS-CoV-2 target nucleic acids are NOT DETECTED.  The SARS-CoV-2 RNA  is generally detectable in upper respiratory specimens during the acute phase of infection. The lowest concentration of SARS-CoV-2 viral copies this assay can detect is 138 copies/mL. A negative result does not preclude SARS-Cov-2 infection and should not be used as the sole basis for treatment or other patient management decisions. A negative result may occur with  improper specimen collection/handling, submission of specimen other than nasopharyngeal swab, presence of viral mutation(s) within the areas targeted by this assay, and inadequate number of viral copies(<138 copies/mL). A negative result must be combined with clinical observations, patient history, and epidemiological information. The expected result is Negative.  Fact Sheet for Patients:  bloggercourse.com  Fact Sheet for Healthcare Providers:  seriousbroker.it  This test is no t yet approved or cleared by the [place]  FDA and  has been authorized for detection and/or diagnosis of SARS-CoV-2 by FDA under an Emergency Use Authorization (EUA). This EUA will remain  in effect (meaning this test can be used) for the duration of the COVID-19 declaration under Section 564(b)(1) of the Act, 21 U.S.C.section 360bbb-3(b)(1), unless the authorization is terminated  or revoked sooner.       Influenza A by PCR  NEGATIVE NEGATIVE Final   Influenza B by PCR NEGATIVE NEGATIVE Final    Comment: (NOTE) The Xpert Xpress SARS-CoV-2/FLU/RSV plus assay is intended as an aid in the diagnosis of influenza from Nasopharyngeal swab specimens and should not be used as a sole basis for treatment. Nasal washings and aspirates are unacceptable for Xpert Xpress SARS-CoV-2/FLU/RSV testing.  Fact Sheet for Patients: bloggercourse.com  Fact Sheet for Healthcare Providers: seriousbroker.it  This test is not yet approved or cleared by the [place]  FDA and has been authorized for detection and/or diagnosis of SARS-CoV-2 by FDA under an Emergency Use Authorization (EUA). This EUA will remain in effect (meaning this test can be used) for the duration of the COVID-19 declaration under Section 564(b)(1) of the Act, 21 U.S.C. section 360bbb-3(b)(1), unless the authorization is terminated or revoked.     Resp Syncytial Virus by PCR NEGATIVE NEGATIVE Final    Comment: (NOTE) Fact Sheet for Patients: bloggercourse.com  Fact Sheet for Healthcare Providers: seriousbroker.it  This test is not yet approved or cleared by the [place]  FDA and has been authorized for detection and/or diagnosis of SARS-CoV-2 by FDA under an Emergency Use Authorization (EUA). This EUA will remain in effect (meaning this test can be used) for the duration of the COVID-19 declaration under Section 564(b)(1) of the Act, 21 U.S.C. section 360bbb-3(b)(1), unless the authorization is terminated or revoked.  Performed at [facility], [address] [place], [place] [address]      Medications:    atorvastatin   40 mg Oral q AM   cyanocobalamin   2,000 mcg Oral Q M,W,F   enoxaparin  (LOVENOX ) injection  40 mg Subcutaneous Daily   lactulose   30 g Oral TID   levothyroxine   175 mcg Oral QAC breakfast   magnesium   gluconate  500 mg Oral BID   megestrol   40 mg Oral BID   methocarbamol   500 mg Oral q AM   midodrine   10 mg Oral TID WC   multivitamin with minerals  1 tablet Oral Daily   pantoprazole   40 mg Oral BID AC   rifaximin   550 mg Oral BID   rOPINIRole   0.25 mg Oral QHS   senna-docusate  1 tablet Oral BID   sodium bicarbonate   1,300 mg Oral BID   venlafaxine  XR  75  mg Oral Q breakfast   white petrolatum    Topical BID   Continuous Infusions:  cefTRIAXone  (ROCEPHIN )  IV Stopped (01/11/24 1744)      LOS: 2 days   Erle Odell Castor  [facility]  01/12/2024, 8:06 AM

## 2024-01-12 NOTE — Evaluation (Signed)
 Physical Therapy Evaluation Patient Details Name: [name] MRN: [id number] DOB: 1955/01/20 Today's Date: 01/12/2024  History of Present Illness  69 y.o. female with medical history significant of NASH cirrhosis and associated hepatic encephalopathy, depression, short gut syndrome with chronic diarrhea, CKD stage 3A, ACD, hypothyroidism, chronic hypotension who presented to ED on 12/29 with confusion. She was recently admitted 12/23-12/27 for same complaints.  Clinical Impression    Pt admitted with above diagnosis.  Pt currently with functional limitations due to the deficits listed below (see PT Problem List). Pt in bed when PT arrived. Pt agreeable to therapy intervention. Pt reports chronic LBP and L knee pain, pt is mod I for supine <> sit, S for sit to stand  from EOB, close S for gait tasks in hallway 80 feet with RW and min cues. Pt elected to return to bed and all needs in place. Pt is motivated to d/c from hospital, participate with [facility], spend time with all the kittens and her dog and be able to travel with son. Pt will benefit from acute skilled PT to increase their independence and safety with mobility to allow discharge.         If plan is discharge home, recommend the following: A little help with walking and/or transfers;A little help with bathing/dressing/bathroom;Assistance with cooking/housework;Assist for transportation;Help with stairs or ramp for entrance   Can travel by private vehicle        Equipment Recommendations None recommended by PT  Recommendations for Other Services       Functional Status Assessment Patient has had a recent decline in their functional status and demonstrates the ability to make significant improvements in function in a reasonable and predictable amount of time.     Precautions / Restrictions Precautions Precautions: Fall Recall of Precautions/Restrictions: Impaired Precaution/Restrictions Comments: L eye blind Restrictions Weight  Bearing Restrictions Per Provider Order: No      Mobility  Bed Mobility Overal bed mobility: Modified Independent             General bed mobility comments: increased time and use of hospital bed features no verbal cues or physical assist for supine <> sit    Transfers Overall transfer level: Needs assistance Equipment used: Rolling walker (2 wheels) Transfers: Sit to/from Stand Sit to Stand: Supervision           General transfer comment: min cues for sit to stand from bed to Rw    Ambulation/Gait Ambulation/Gait assistance: Supervision Gait Distance (Feet): 80 Feet Assistive device: Rolling walker (2 wheels) Gait Pattern/deviations: Step-to pattern, Decreased step length - right, Decreased step length - left, Wide base of support Gait velocity: decreased     General Gait Details: min cues for posture, safety and direction with pt limited due to reports of SOB and fatigue o2 saturation upon return to room 98% on RA  Stairs            Wheelchair Mobility     Tilt Bed    Modified Rankin (Stroke Patients Only)       Balance Overall balance assessment: Needs assistance Sitting-balance support: Feet supported Sitting balance-Leahy Scale: Good     Standing balance support: Bilateral upper extremity supported, During functional activity, Reliant on assistive device for balance Standing balance-Leahy Scale: Poor                               Pertinent Vitals/Pain Pain Assessment Pain Assessment: Faces Faces  Pain Scale: Hurts little more Breathing: normal Pain Location: back and L knee Pain Descriptors / Indicators: Discomfort, Aching, Crying, Constant, Sore Pain Intervention(s): Monitored during session, Limited activity within patient's tolerance    Home Living Family/patient expects to be discharged to:: Private residence Living Arrangements: Children (Son: [name]) Available Help at Discharge: Family;Available 24 hours/day Type of  Home: House Home Access: Ramped entrance       Home Layout: One level Home Equipment: Rollator (4 wheels);Rolling Walker (2 wheels);BSC/3in1;Other (comment);Hospital bed;Wheelchair - manual;Adaptive equipment;Hand held shower head Additional Comments: Pt lives with son [name] - very supportive, provides assist as needed    Prior Function Prior Level of Function : Needs assist       Physical Assist : ADLs (physical);Mobility (physical)     Mobility Comments: son typically present for all mobility; uses RW in home or holds to son's hands for in home ambulation, manual WC outside home. Son reports that she is able to ambulate without assistance for very short distances. ADLs Comments: son assisting wtih ADLs as needed     Extremity/Trunk Assessment        Lower Extremity Assessment Lower Extremity Assessment: Generalized weakness    Cervical / Trunk Assessment Cervical / Trunk Assessment: Kyphotic  Communication   Communication Communication: No apparent difficulties    Cognition Arousal: Alert Behavior During Therapy: Lability   PT - Cognitive impairments: No apparent impairments                         Following commands: Intact       Cueing Cueing Techniques: Verbal cues     General Comments      Exercises     Assessment/Plan    PT Assessment Patient needs continued PT services  PT Problem List Decreased activity tolerance;Decreased balance;Pain       PT Treatment Interventions DME instruction;Gait training;Functional mobility training;Therapeutic activities;Therapeutic exercise;Balance training;Neuromuscular re-education;Patient/family education    PT Goals (Current goals can be found in the Care Plan section)  Acute Rehab PT Goals Patient Stated Goal: return home with family support and all the animals PT Goal Formulation: With patient/family Time For Goal Achievement: 01/26/24 Potential to Achieve Goals: Good    Frequency Min 3X/week      Co-evaluation               AM-PAC PT 6 Clicks Mobility  Outcome Measure Help needed turning from your back to your side while in a flat bed without using bedrails?: None Help needed moving from lying on your back to sitting on the side of a flat bed without using bedrails?: None Help needed moving to and from a bed to a chair (including a wheelchair)?: None Help needed standing up from a chair using your arms (e.g., wheelchair or bedside chair)?: A Little Help needed to walk in hospital room?: A Little Help needed climbing 3-5 steps with a railing? : Total 6 Click Score: 19    End of Session Equipment Utilized During Treatment: Gait belt Activity Tolerance: Patient tolerated treatment well Patient left: in bed;with call bell/phone within reach;with bed alarm set Nurse Communication: Mobility status PT Visit Diagnosis: Unsteadiness on feet (R26.81);Muscle weakness (generalized) (M62.81);Other abnormalities of gait and mobility (R26.89)    Time: 8781-8762 PT Time Calculation (min) (ACUTE ONLY): 19 min   Charges:   PT Evaluation $PT Eval Low Complexity: 1 Low PT Treatments $Gait Training: 8-22 mins PT General Charges $$ ACUTE PT VISIT: 1 Visit  [place], PT Acute Rehab   [place] VEAR Drone 01/12/2024, 3:20 PM

## 2024-01-12 NOTE — Plan of Care (Signed)
  Problem: Education: Goal: Knowledge of General Education information will improve Description: Including pain rating scale, medication(s)/side effects and non-pharmacologic comfort measures Outcome: Progressing   Problem: Health Behavior/Discharge Planning: Goal: Ability to manage health-related needs will improve Outcome: Progressing   Problem: Clinical Measurements: Goal: Ability to maintain clinical measurements within normal limits will improve Outcome: Progressing Goal: Will remain free from infection Outcome: Progressing Goal: Diagnostic test results will improve Outcome: Progressing Goal: Respiratory complications will improve Outcome: Progressing   Problem: Activity: Goal: Risk for activity intolerance will decrease Outcome: Progressing   Problem: Elimination: Goal: Will not experience complications related to bowel motility Outcome: Progressing Goal: Will not experience complications related to urinary retention Outcome: Progressing   Problem: Pain Managment: Goal: General experience of comfort will improve and/or be controlled Outcome: Progressing   Problem: Skin Integrity: Goal: Risk for impaired skin integrity will decrease Outcome: Progressing

## 2024-01-13 DIAGNOSIS — K5900 Constipation, unspecified: Secondary | ICD-10-CM

## 2024-01-13 DIAGNOSIS — K7682 Hepatic encephalopathy: Secondary | ICD-10-CM | POA: Diagnosis not present

## 2024-01-13 DIAGNOSIS — K219 Gastro-esophageal reflux disease without esophagitis: Secondary | ICD-10-CM | POA: Diagnosis not present

## 2024-01-13 NOTE — Plan of Care (Signed)
  Problem: Education: Goal: Knowledge of General Education information will improve Description: Including pain rating scale, medication(s)/side effects and non-pharmacologic comfort measures Outcome: Progressing   Problem: Health Behavior/Discharge Planning: Goal: Ability to manage health-related needs will improve Outcome: Progressing   Problem: Clinical Measurements: Goal: Ability to maintain clinical measurements within normal limits will improve Outcome: Progressing Goal: Will remain free from infection Outcome: Progressing Goal: Diagnostic test results will improve Outcome: Progressing Goal: Respiratory complications will improve Outcome: Progressing   Problem: Activity: Goal: Risk for activity intolerance will decrease Outcome: Progressing   Problem: Elimination: Goal: Will not experience complications related to bowel motility Outcome: Progressing Goal: Will not experience complications related to urinary retention Outcome: Progressing   Problem: Pain Managment: Goal: General experience of comfort will improve and/or be controlled Outcome: Progressing   Problem: Skin Integrity: Goal: Risk for impaired skin integrity will decrease Outcome: Progressing

## 2024-01-13 NOTE — Discharge Summary (Signed)
 Physician Discharge Summary  [name] FMW:[id number] DOB: 08-Jul-1955 DOA: 01/08/2024  PCP: [name], MD  Admit date: 01/08/2024 Discharge date: 01/13/2024  Admitted From: Home Disposition:  Home  Recommendations for Outpatient Follow-up:  Follow up with PCP in 1-2 weeks Please obtain BMP/CBC in one week   Home Health:No Equipment/Devices:None  Discharge Condition:Stable CODE STATUS:Full Diet recommendation: Heart Healthy  Brief/Interim Summary: 69 y.o. 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 She was started on IV fluids Lasix  and Aldactone  were held she was continued on oral lactulose  and rifaximin . Her mentation improved significantly. She was continued on midodrine . She will go home on Aldactone  but off Lasix . PT evaluated the patient recommended home health PT.  Acute kidney injury on chronic kidney disease stage IIIa: Baseline creatinine around 1 diuretics were held her creatinine returned to  baseline. She will go home on Aldactone  low-dose.  Hypokalemia: Repleted orally now improved.  Chronic hypotension: Continue midodrine .  Chronic back pain: No changes made to her medication.  General anxiety disorder: No changes made to her medication.  Hypothyroidism:  Continue Synthroid   Hyperlipidemia: Continue statins.  Chronic thrombocytopenia: Appears to be at baseline.    Discharge Instructions  Discharge Instructions     Increase activity slowly   Complete by: As directed       Allergies as of 01/13/2024       Reactions   Ace Inhibitors Anaphylaxis, Swelling   Gadolinium Derivatives Anaphylaxis   Ivp Dye [iodinated Contrast Media] Anaphylaxis   Wound Dressing Adhesive Dermatitis   Nsaids Other (See Comments)   Contraindication due to CKD    Butrans [buprenorphine] Other (See Comments)   Discontinued due to liver function   Yellow Jacket Venom Swelling   Hibiclens  [chlorhexidine  Gluconate] Rash, Other (See Comments)   Worsens rash   Penicillins Dermatitis, Rash   Vibramycin [doxycycline] Rash   Zofran  [ondansetron ] Rash        Medication List     STOP taking these medications    furosemide  40 MG tablet Commonly known as: LASIX    Nucynta  ER 200 MG Tb12 Generic drug: Tapentadol  HCl   nystatin  cream Commonly known as: MYCOSTATIN    potassium chloride  10 MEQ tablet Commonly known as: KLOR-CON    senna-docusate 8.6-50 MG tablet Commonly known as: Senokot-S       TAKE these medications    rOPINIRole  0.25 MG tablet Commonly known as: REQUIP  TAKE 1 TABLET(0.25 MG) BY MOUTH AT BEDTIME The timing of this medication is very important.   acetaminophen  500 MG tablet Commonly known as: TYLENOL  Take 500-1,000 mg by mouth every 6 (six) hours as needed for headache, fever or mild pain (pain  score 1-3).   albuterol  108 (90 Base) MCG/ACT inhaler Commonly known as: VENTOLIN  HFA Inhale 2 puffs into the lungs every 6 (six) hours as needed for  wheezing or shortness of breath.   atorvastatin  40 MG tablet Commonly known as: LIPITOR TAKE 1 TABLET(40 MG) BY MOUTH IN THE MORNING What changed: See the new instructions.   CENTRUM SILVER  ULTRA WOMENS PO Take 1 tablet by mouth daily with breakfast.   colchicine  0.6 MG tablet Take 0.6 mg by mouth 2 (two) times daily as needed (as directed for gout flares).   Constulose  10 GM/15ML solution Generic drug: lactulose  Take 45 mLs (30 g total) by mouth 3 (three) times daily.   copper  tablet Take 2 mg by mouth in the morning and at bedtime.   cyanocobalamin  1000 MCG tablet Commonly known as: VITAMIN B12 Take 2,000 mcg by mouth every Monday, Wednesday, and Friday.   dicyclomine  10 MG capsule Commonly known as: BENTYL  Take 1 capsule (10 mg total) by mouth 3 (three) times daily as needed for spasms (abdominal cramping).   EPINEPHrine  0.3 mg/0.3 mL Soaj injection Commonly known as: EPI-PEN Inject 0.3 mg into the muscle as needed for anaphylaxis.   famotidine  20 MG tablet Commonly known as: PEPCID  Take 20 mg by mouth daily as needed for heartburn or indigestion.   HYDROcodone -acetaminophen  10-325 MG tablet Commonly known as: NORCO Take 1 tablet by mouth 3 (three) times daily as needed (for pain- may take up to a sum total of 3 tablets/24 hours).   IRON -VITAMIN C  PO Take 1 tablet by mouth daily.   levothyroxine  175 MCG tablet Commonly known as: SYNTHROID  Take 1 tablet (175 mcg total) by mouth daily before breakfast.   magnesium  gluconate 500 (27 Mg) MG Tabs tablet Commonly known as: MAGONATE Take 1 tablet (500 mg total) by mouth in the morning and at bedtime.   meclizine  25 MG tablet Commonly known as: ANTIVERT  Take 1 tablet (25 mg total) by mouth 3 (three) times daily as needed for dizziness.   megestrol  40 MG tablet Commonly known as: MEGACE  Take 1 tablet (40 mg total) by mouth 2 (two) times daily.   methocarbamol  500 MG tablet Commonly known as: ROBAXIN  Take 500 mg  by mouth in the morning.   midodrine  10 MG tablet Commonly known as: PROAMATINE  Take 10 mg by mouth 3 (three) times daily.   naloxone 4 MG/0.1ML Liqd nasal spray kit Commonly known as: NARCAN Place 1 spray into the nose as needed (accidental overdose).   pantoprazole  40 MG tablet Commonly known as: PROTONIX  TAKE 1 TABLET(40 MG) BY MOUTH TWICE DAILY BEFORE A MEAL What changed: See the new instructions.   rifaximin  550 MG Tabs tablet Commonly known as: XIFAXAN  Take 550 mg by mouth in the morning and at bedtime.   sodium bicarbonate  650 MG tablet Take 1 tablet (650 mg total) by mouth 2 (two) times daily.   spironolactone  50 MG tablet Commonly known as: ALDACTONE  Take 1 tablet (50 mg total) by mouth daily.   triamcinolone  cream 0.1 % Commonly known as: KENALOG  Apply 1 Application topically 2 (two) times daily as needed (skin irritation).   venlafaxine  XR 75 MG 24 hr capsule Commonly known as: EFFEXOR -XR Take 1 capsule (75 mg total) by mouth daily with breakfast.        Allergies[1]  Consultations: None   Procedures/Studies: CT HEAD WO CONTRAST Result Date: 01/08/2024 EXAM: CT HEAD WITHOUT CONTRAST 01/08/2024 04:42:05 PM TECHNIQUE: CT of the head was performed without the administration  of intravenous contrast. Automated exposure control, iterative reconstruction, and/or weight based adjustment of the mA/kV was utilized to reduce the radiation dose to as low as reasonably achievable. COMPARISON: Head CT 12/20/2023 and MRI 10/17/2022. CLINICAL HISTORY: Mental status change, unknown cause. FINDINGS: The examination is mildly motion degraded. BRAIN AND VENTRICLES: There is no evidence of an acute infarct, intracranial hemorrhage, mass, midline shift, hydrocephalus, or extra-axial fluid collection. Cerebral volume is within normal limits for age. Patchy hypodensities in the cerebral white matter are similar to the prior CT and nonspecific but compatible with moderate chronic  small vessel ischemic disease. Calcified atherosclerosis at the skull base. ORBITS: Left cataract extraction. SINUSES: No acute abnormality. SOFT TISSUES AND SKULL: No acute soft tissue abnormality. No skull fracture. IMPRESSION: 1. No acute intracranial abnormality. 2. Moderate chronic small vessel ischemic disease. Electronically signed by: Dasie Hamburg MD 01/08/2024 04:58 PM EST RP Workstation: [id number]   CT ABDOMEN PELVIS WO CONTRAST Result Date: 01/05/2024 CLINICAL DATA:  History of recurrent hepatic encephalopathy with abdominal pain and altered mental status EXAM: CT ABDOMEN AND PELVIS WITHOUT CONTRAST TECHNIQUE: Multidetector CT imaging of the abdomen and pelvis was performed following the standard protocol without IV contrast. RADIATION DOSE REDUCTION: This exam was performed according to the departmental dose-optimization program which includes automated exposure control, adjustment of the mA and/or kV according to patient size and/or use of iterative reconstruction technique. COMPARISON:  CT abdomen and pelvis dated 10/29/2023 FINDINGS: Lower chest: Lingular basilar perifissural nodules measuring up to 5 mm (4:26, 27). No pleural effusion or pneumothorax demonstrated. Partially imaged heart size is normal. Coronary artery calcifications. Hepatobiliary: Cirrhotic morphology. No focal hepatic lesions. No intra or extrahepatic biliary ductal dilation. Cholecystectomy. Pancreas: No focal lesions or main ductal dilation. Spleen: Unchanged splenomegaly measures 14.6 cm in AP dimension. Adrenals/Urinary Tract: No adrenal nodules. No suspicious renal masses by noncontrast technique. Simple exophytic anterior left upper pole cyst measures 2.3 cm. No hydronephrosis. Bilateral nonobstructing stones measuring up to 4 mm in the upper pole right kidney. Urinary bladder is decompressed. Stomach/Bowel: Normal appearance of the stomach. No evidence of bowel wall thickening, distention, or inflammatory changes.  Appendix is not discretely seen. Vascular/Lymphatic: Aortic atherosclerosis. Prominent retroperitoneal varices. Partially imaged superficial varicosity in the anterior proximal left thigh. No enlarged abdominal or pelvic lymph nodes. Reproductive: Multiple coarsely calcified uterine masses, likely leiomyomata. Other: No free fluid, fluid collection, or free air. Musculoskeletal: No acute or abnormal lytic or blastic osseous lesions. Multilevel degenerative changes of the partially imaged thoracic and lumbar spine. Unchanged compression deformity of T12 status post vertebral augmentation. Small fat and ascites containing left lower anterior abdominal hernia (2:72). Calcified injection granulomata in the anterior abdominal wall. Bilateral flank body wall edema. IMPRESSION: 1. No acute abdominopelvic findings. 2. Cirrhosis with sequela of portal hypertension including splenomegaly and prominent retroperitoneal varices. 3. Bilateral nonobstructing renal stones measuring up to 4 mm in the upper pole right kidney. 4. Small fat and ascites containing left lower anterior abdominal hernia. 5. Lingular basilar perifissural nodules measuring up to 5 mm may be infectious/inflammatory. 6.  Aortic Atherosclerosis (ICD10-I70.0). Electronically Signed   By: Limin  Xu M.D.   On: 01/05/2024 16:51   DG Chest 2 View Result Date: 01/02/2024 CLINICAL DATA:  Cough EXAM: CHEST - 2 VIEW COMPARISON:  12/20/2023, 10/29/2023 FINDINGS: Mild cardiomegaly and aortic atherosclerosis. No focal opacity, pleural effusion, or pneumothorax IMPRESSION: No active cardiopulmonary disease. Mild cardiomegaly. Electronically Signed   By: Luke Bun M.D.   On: 01/02/2024 16:11  DG Foot 2 Views Right Result Date: 12/22/2023 EXAM: 1 or 2 VIEW(S) XRAY OF THE RIGHT FOOT 12/22/2023 01:59:00 PM COMPARISON: None available. CLINICAL HISTORY: Injury of right great toe. FINDINGS: BONES AND JOINTS: There is profound bone density loss. This limits assessment  for nondisplaced fractures but no displaced acute foot fracture is seen. There is a healed fracture deformity of the third toe proximal phalanx. There are mild degenerative changes in the forefoot but no erosive arthropathy. There is a prominent noninflammatory plantar calcaneal spur. Mild midfoot arthrosis. SOFT TISSUES: There is mild generalized soft tissue edema. Vascular calcifications extending into the base of the toes, heavy calcification in the anterior and posterior tibial arteries in the distal foreleg. IMPRESSION: 1. No displaced acute foot fracture. Assessment for nondisplaced fractures is limited by profound bone density loss. 2. Healed fracture deformity of the third toe proximal phalanx. 3. Vascular calcifications extending into the base of the toes with heavy calcification in the anterior and posterior tibial arteries in the distal foreleg. Electronically signed by: Francis Quam MD 12/22/2023 09:12 PM EST RP Workstation: [id number]   CT Head Wo Contrast Result Date: 12/20/2023 CLINICAL DATA:  L2 altered mental status. EXAM: CT HEAD WITHOUT CONTRAST TECHNIQUE: Contiguous axial images were obtained from the base of the skull through the vertex without intravenous contrast. RADIATION DOSE REDUCTION: This exam was performed according to the departmental dose-optimization program which includes automated exposure control, adjustment of the mA and/or kV according to patient size and/or use of iterative reconstruction technique. COMPARISON:  December 14, 2023 FINDINGS: Brain: There is generalized cerebral atrophy with widening of the extra-axial spaces and ventricular dilatation. There are areas of decreased attenuation within the white matter tracts of the supratentorial brain, consistent with microvascular disease changes. Vascular: Marked severity calcification of the bilateral cavernous carotid arteries, the visualized portion of the left vertebral artery and proximal portion of the basilar artery  is noted. Skull: Normal. Negative for fracture or focal lesion. Sinuses/Orbits: No acute finding. Other: None. IMPRESSION: 1. Generalized cerebral atrophy with chronic white matter small vessel ischemic changes. 2. No acute intracranial abnormality. Electronically Signed   By: Suzen Dials M.D.   On: 12/20/2023 13:27   DG Chest Portable 1 View Result Date: 12/20/2023 CLINICAL DATA:  Altered mental status. EXAM: PORTABLE CHEST 1 VIEW COMPARISON:  December 14, 2023 FINDINGS: The cardiac silhouette is mildly enlarged and unchanged in size. There is marked severity calcification of the aortic arch. No acute infiltrate, pleural effusion or pneumothorax is identified. The visualized skeletal structures are unremarkable. IMPRESSION: Stable cardiomegaly without acute or active cardiopulmonary disease. Electronically Signed   By: Suzen Dials M.D.   On: 12/20/2023 13:23   CT Head Wo Contrast Result Date: 12/14/2023 EXAM: CT HEAD WITHOUT CONTRAST 12/14/2023 04:53:00 PM TECHNIQUE: CT of the head was performed without the administration of intravenous contrast. Automated exposure control, iterative reconstruction, and/or weight based adjustment of the mA/kV was utilized to reduce the radiation dose to as low as reasonably achievable. COMPARISON: Head CT 10/29/2023 and MRI 10/17/2022. CLINICAL HISTORY: Mental status change, unknown cause. FINDINGS: BRAIN AND VENTRICLES: There is no evidence of an acute infarct, intracranial hemorrhage, mass, midline shift, hydrocephalus, or extra-axial fluid collection. Cerebral volume is within normal limits for age. Patchy hypodensities in the cerebral white matter are similar to the prior CT and nonspecific but compatible with moderate chronic small vessel ischemic disease. Calcified atherosclerosis at the skull base. ORBITS: Left cataract extraction. SINUSES: No acute abnormality. SOFT TISSUES AND SKULL:  No acute soft tissue abnormality. No skull fracture. IMPRESSION: 1. No  acute intracranial abnormality. 2. Moderate chronic small vessel ischemic disease. Electronically signed by: Dasie Hamburg MD 12/14/2023 05:45 PM EST RP Workstation: [id number]   DG Chest Portable 1 View Result Date: 12/14/2023 CLINICAL DATA:  Altered mental status EXAM: PORTABLE CHEST 1 VIEW COMPARISON:  Prior chest x-ray 10/29/2023 FINDINGS: The heart size and mediastinal contours are within normal limits. Both lungs are clear save for mild chronic bronchitic changes. The visualized skeletal structures are unremarkable. Atherosclerotic vascular calcifications present in the transverse aorta. IMPRESSION: No active disease. Electronically Signed   By: Wilkie Lent M.D.   On: 12/14/2023 15:39    Subjective: No complaints  Discharge Exam: Vitals:   01/12/24 1951 01/13/24 0348  BP: (!) 112/55 125/62  Pulse: 71 79  Resp: 20 20  Temp: 98.7 F (37.1 C) 98.8 F (37.1 C)  SpO2: 100% 99%   Vitals:   01/12/24 1346 01/12/24 1951 01/13/24 0348 01/13/24 0500  BP: 125/71 (!) 112/55 125/62   Pulse: 83 71 79   Resp: 17 20 20    Temp: 98.4 F (36.9 C) 98.7 F (37.1 C) 98.8 F (37.1 C)   TempSrc: Oral Oral Oral   SpO2: 98% 100% 99%   Weight:    91.7 kg  Height:        General: Pt is alert, awake, not in acute distress Cardiovascular: RRR, S1/S2 +, no rubs, no gallops Respiratory: CTA bilaterally, no wheezing, no rhonchi Abdominal: Soft, NT, ND, bowel sounds + Extremities: no edema, no cyanosis    The results of significant diagnostics from this hospitalization (including imaging, microbiology, ancillary and laboratory) are listed below for reference.     Microbiology: No results found for this or any previous visit (from the past 240 hours).   Labs: BNP (last 3 results) Recent Labs    08/24/23 1544  BNP 72.9   Basic Metabolic Panel: Recent Labs  Lab 01/08/24 1658 01/09/24 1748 01/09/24 2000 01/10/24 0500 01/11/24 0606 01/12/24 1018  NA 138 140  --  144 143 142  K 4.5  4.1  --  3.6 3.2* 3.7  CL 109 111  --  118* 117* 116*  CO2 18* 19*  --  18* 17* 18*  GLUCOSE 102* 115*  --  91 102* 104*  BUN 25* 31*  --  27* 22 19  CREATININE 1.46* 1.57*  --  1.22* 1.13* 1.11*  CALCIUM  9.3 9.3  --  8.5* 8.5* 8.3*  MG  --   --  1.8  --   --   --    Liver Function Tests: Recent Labs  Lab 01/07/24 1400 01/08/24 1658 01/09/24 1748 01/10/24 0500 01/11/24 0606  AST 33 59* 43* 34 30  ALT 20 24 25 19 16   ALKPHOS 145* 154* 128* 104 100  BILITOT 1.4* 1.7* 1.9* 1.9* 1.3*  PROT 6.2* 6.9 6.0* 4.9* 4.7*  ALBUMIN  3.3* 3.6 3.3* 2.8* 2.5*   No results for input(s): LIPASE, AMYLASE in the last 168 hours. Recent Labs  Lab 01/07/24 1400 01/08/24 1810 01/09/24 2000 01/10/24 0500  AMMONIA 127* 121* 113* 87*   CBC: Recent Labs  Lab 01/07/24 1400 01/08/24 1658 01/09/24 1748 01/10/24 0500  WBC 6.8 8.1 7.6 5.5  NEUTROABS 3.9  --  4.9  --   HGB 12.2 13.5 11.9* 10.7*  HCT 38.8 43.3 37.3 33.2*  MCV 98.7 96.9 95.4 96.5  PLT 149* 173 162 111*   Cardiac Enzymes: No results  for input(s): CKTOTAL, CKMB, CKMBINDEX, TROPONINI in the last 168 hours. BNP: Invalid input(s): POCBNP CBG: No results for input(s): GLUCAP in the last 168 hours. D-Dimer No results for input(s): DDIMER in the last 72 hours. Hgb A1c No results for input(s): HGBA1C in the last 72 hours. Lipid Profile No results for input(s): CHOL, HDL, LDLCALC, TRIG, CHOLHDL, LDLDIRECT in the last 72 hours. Thyroid  function studies No results for input(s): TSH, T4TOTAL, T3FREE, THYROIDAB in the last 72 hours.  Invalid input(s): FREET3 Anemia work up No results for input(s): VITAMINB12, FOLATE, FERRITIN, TIBC, IRON , RETICCTPCT in the last 72 hours. Urinalysis    Component Value Date/Time   COLORURINE YELLOW 01/08/2024 1658   APPEARANCEUR CLEAR 01/08/2024 1658   LABSPEC 1.008 01/08/2024 1658   PHURINE 5.0 01/08/2024 1658   GLUCOSEU NEGATIVE 01/08/2024  1658   HGBUR NEGATIVE 01/08/2024 1658   BILIRUBINUR NEGATIVE 01/08/2024 1658   KETONESUR NEGATIVE 01/08/2024 1658   PROTEINUR NEGATIVE 01/08/2024 1658   UROBILINOGEN 0.2 08/21/2013 0017   NITRITE NEGATIVE 01/08/2024 1658   LEUKOCYTESUR TRACE (A) 01/08/2024 1658   Sepsis Labs Recent Labs  Lab 01/07/24 1400 01/08/24 1658 01/09/24 1748 01/10/24 0500  WBC 6.8 8.1 7.6 5.5   Microbiology No results found for this or any previous visit (from the past 240 hours).   Time coordinating discharge: Over 30 minutes  SIGNED:   Erle Odell Castor, MD  [facility] 01/13/2024, 9:39 AM Pager   If 7PM-7AM, please contact night-coverage [email or web address] Password [email or web address]     [1]  Allergies Allergen Reactions   Ace Inhibitors Anaphylaxis and Swelling   Gadolinium Derivatives Anaphylaxis   Ivp Dye [Iodinated Contrast Media] Anaphylaxis   Wound Dressing Adhesive Dermatitis   Nsaids Other (See Comments)    Contraindication due to CKD    Butrans [Buprenorphine] Other (See Comments)    Discontinued due to liver function   Yellow Jacket Venom Swelling   Hibiclens  [Chlorhexidine  Gluconate] Rash and Other (See Comments)    Worsens rash   Penicillins Dermatitis and Rash   Vibramycin [Doxycycline] Rash   Zofran  [Ondansetron ] Rash

## 2024-01-13 NOTE — TOC Transition Note (Signed)
 Transition of Care [facility]) - Discharge Note   Patient Details  Name: [name] MRN: [id number] Date of Birth: 09-29-1955  [facility] CM/SW Contact:  Jon ONEIDA Anon, RN Phone Number: 01/13/2024, 11:03 AM   Clinical Narrative:    Pt will discharge home with [facility] services through [name]. Pt/ pt son agreeable to DC plan. HH orders are in place. Pt states she has needed DME in the home. Pt son to provide transportation at discharge. No further ICM needs at this time. ICM will sign off.   Final next level of care: Home w Home Health Services Barriers to Discharge: Barriers Resolved   Patient Goals and CMS Choice Patient states their goals for this hospitalization and ongoing recovery are:: Home with son to recieve [facility] services CMS Medicare.gov Compare Post Acute Care list provided to:: Patient Choice offered to / list presented to : Patient [place] ownership interest in [facility].provided to:: Patient    Discharge Placement                  Name of family member notified: Giannamarie, Paulus, Emergency Contact  [phone] Patient and family notified of of transfer: 01/13/24  Discharge Plan and Services Additional resources added to the After Visit Summary for                  DME Arranged: N/A DME Agency: NA       HH Arranged: PT, OT HH Agency: [facility] Date HH Agency Contacted: 01/13/24 Time HH Agency Contacted: [id number] Representative spoke with at [facility] Agency: Accepted through the [employer]  Social Drivers of Health (SDOH) Interventions SDOH Screenings   Food Insecurity: No Food Insecurity (01/08/2024)  Housing: Unknown (01/08/2024)  Transportation Needs: No Transportation Needs (01/08/2024)  Utilities: Not At Risk (01/08/2024)  Alcohol Screen: Low Risk (09/20/2023)  Depression (PHQ2-9): Low Risk (11/07/2023)  Recent Concern: Depression (PHQ2-9) - Medium Risk (09/20/2023)  Financial Resource Strain: Low Risk (09/20/2023)  Physical  Activity: Inactive (09/20/2023)  Social Connections: Unknown (01/02/2024)  Recent Concern: Social Connections - Socially Isolated (12/20/2023)  Stress: No Stress Concern Present (09/20/2023)  Tobacco Use: Medium Risk (01/10/2024)   Received from [facility]  Health Literacy: Adequate Health Literacy (09/20/2023)     Readmission Risk Interventions    01/13/2024   11:00 AM 12/22/2023    1:57 PM 10/31/2023    8:35 AM  Readmission Risk Prevention Plan  Transportation Screening Complete Complete Complete  Medication Review Oceanographer) Complete Complete Complete  PCP or Specialist appointment within 3-5 days of discharge Complete Complete   HRI or Home Care Consult Complete Complete Complete  SW Recovery Care/Counseling Consult Complete Complete Complete  Palliative Care Screening Not Applicable Not Applicable Not Applicable  Skilled Nursing Facility Complete Not Applicable Not Applicable

## 2024-01-15 ENCOUNTER — Telehealth: Payer: Self-pay | Admitting: *Deleted

## 2024-01-15 NOTE — Transitions of Care (Post Inpatient/ED Visit) (Signed)
 "  01/15/2024  Name: [name] MRN: [id number] DOB: March 21, 1955  Today's TOC FU Call Status: Today's TOC FU Call Status:: Successful TOC FU Call Completed TOC FU Call Complete Date: 01/15/24  Patient's Name and Date of Birth confirmed. 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 How have you been since you were released from the hospital?: Better (per son [name]), pt is feeling  so much better and doing good)  Items Reviewed: Did you receive and understand the discharge instructions provided?: Yes Medications obtained,verified, and reconciled?: Yes (Medications Reviewed) Any new allergies since your discharge?: No Dietary orders reviewed?: Yes Type of Diet Ordered:: heart healthy Do you have support at home?: Yes People in Home [RPT]: child(ren), adult Name of Support/Comfort Primary Source: son Kara Reviewed importance of taking lactulose  as prescribed to keep ammonia levels in check Reviewed signs /symptoms of elevated ammonia, importance of seeking emergency care  Medications Reviewed Today: Medications Reviewed Today     Reviewed by [name] LABOR, RN (Registered Nurse) on 01/15/24 at 1535  Med List Status: <None>   Medication Order Taking? Sig Documenting Provider Last Dose Status Informant  acetaminophen  (TYLENOL ) 500 MG tablet [id number] Yes Take 500-1,000 mg by mouth every 6 (six) hours as needed for headache, fever or mild pain (pain score 1-3). [provider]  Active Family Member  albuterol  (VENTOLIN  HFA) 108 [phone] Base) MCG/ACT inhaler [id number] Yes Inhale 2 puffs into the lungs every 6 (six) hours as needed for wheezing or shortness of breath. Kara Bene, MD  Active Family Member  atorvastatin  (LIPITOR) 40 MG tablet [id number] Yes  TAKE 1 TABLET(40 MG) BY MOUTH IN THE ERMALINDA Kara Bene, MD  Active Family Member  colchicine  0.6 MG tablet [id number] Yes Take 0.6 mg by mouth 2 (two) times daily as needed (as directed for gout flares). [provider]  Active Family Member  copper  tablet [id number] Yes Take 2 mg by mouth in the morning and at bedtime. [provider]  Active Family Member  cyanocobalamin  (VITAMIN B12) 1000 MCG tablet [id number] Yes Take 2,000 mcg by mouth every Monday, Wednesday, and Friday. [provider]  Active Family Member  dicyclomine  (BENTYL ) 10 MG capsule [id number] Yes Take 1 capsule (10 mg total) by mouth 3 (three) times daily as needed for spasms (abdominal cramping). Kara Bene, MD  Active Family Member  EPINEPHrine  0.3 mg/0.3 mL IJ SOAJ injection [id number] Yes Inject 0.3 mg into the muscle as needed for anaphylaxis. Kara Bene, MD  Active Family Member  famotidine  (PEPCID ) 20 MG tablet [id number] Yes Take 20 mg by mouth daily as needed for heartburn or indigestion. [provider]  Active Family Member  HYDROcodone -acetaminophen  (NORCO) 10-325 MG tablet [id number] Yes Take 1 tablet by mouth 3 (three) times daily as needed (for pain- may take up to Kara sum total of 3 tablets/24 hours). [provider]  Active Family Member           Med Note [name]   Tue Jan 02, 2024  4:18 PM)    IRON -VITAMIN C  PO [id number] Yes Take 1 tablet by mouth daily. [provider]  Active Family Member           Med Note [name]   Tue Jan 09, 2024  8:11 PM) Strength unconfirmed  lactulose  (CHRONULAC ) 10 GM/15ML solution  [id number] Yes Take 45 mLs (30 g total) by mouth 3 (three) times daily. Kara Nest, MD  Active Family Member  levothyroxine  (SYNTHROID ) 175 MCG tablet [id number] Yes Take 1 tablet (175 mcg total) by mouth daily before breakfast. Kara Bene, MD  Active Family Member  magnesium  gluconate (MAGONATE) 500 (27 Mg) MG TABS tablet  [id number] Yes Take 1 tablet (500 mg total) by mouth in the morning and at bedtime. Kara Bene, MD  Active Family Member  meclizine  (ANTIVERT ) 25 MG tablet [id number] Yes Take 1 tablet (25 mg total) by mouth 3 (three) times daily as needed for dizziness. Kara Buttery, MD  Active Family Member  megestrol  (MEGACE ) 40 MG tablet [id number] Yes Take 1 tablet (40 mg total) by mouth 2 (two) times daily. Ajewole, [name], MD  Active Family Member  methocarbamol  (ROBAXIN ) 500 MG tablet [id number] Yes Take 500 mg by mouth in the morning. [provider]  Active Family Member  midodrine  (PROAMATINE ) 10 MG tablet [id number] Yes Take 10 mg by mouth 3 (three) times daily. [provider]  Active Family Member           Med Note [name] Dec 14, 2023  8:25 PM)    Multiple Vitamins-Minerals (CENTRUM SILVER  ULTRA WOMENS PO) [id number] Yes Take 1 tablet by mouth daily with breakfast. [provider]  Active Family Member  naloxone [facility]) nasal spray 4 mg/0.1 mL [id number] Yes Place 1 spray into the nose as needed (accidental overdose). [provider]  Active Family Member           Med Note [name], [place] Kara   Wed Mar 08, 2023  2:39 PM)    pantoprazole  (PROTONIX ) 40 MG tablet [id number] Yes TAKE 1 TABLET(40 MG) BY MOUTH TWICE DAILY BEFORE Kara MEAL Kara Bene, MD  Active Family Member  rifaximin  (XIFAXAN ) 550 MG TABS tablet [id number] Yes Take 550 mg by mouth in the morning and at bedtime. [provider]  Active Family Member  rOPINIRole  (REQUIP ) 0.25 MG tablet [id number] Yes TAKE 1 TABLET(0.25 MG) BY MOUTH AT BEDTIME Kara Bene, MD  Active Family Member  sodium bicarbonate  650 MG tablet [id number] Yes Take 1 tablet (650 mg total) by mouth 2 (two) times daily. Kara Nest, MD  Active Family Member  spironolactone  (ALDACTONE ) 50 MG tablet [id number] Yes Take 1 tablet (50 mg total) by mouth daily. Kara Bene, MD  Active Family Member   triamcinolone  cream (KENALOG ) 0.1 % [id number] Yes Apply 1 Application topically 2 (two) times daily as needed (skin irritation). [provider]  Active Family Member  venlafaxine  XR (EFFEXOR -XR) 75 MG 24 hr capsule [id number] Yes Take 1 capsule (75 mg total) by mouth daily with breakfast. Duanne Butler DASEN, MD  Active Family Member  Med List Note Lesly, Richerd GRADE, [place] 12/29/23 9070): Kara (son) handles all medication 12/29/23 reviewed with son            Home Care and Equipment/Supplies: Were Home Health Services Ordered?: Yes Name of Home Health Agency:: [facility]- patient's son states he has contact # and will call today for resumption of care date Has Agency set up Kara time to come to your home?: No Any new equipment or medical supplies ordered?: No  Functional Questionnaire: Do you need assistance with bathing/showering or dressing?: Yes (son assists) Do you need assistance with meal preparation?: Yes (son cooks meals) Do you need assistance with eating?: No Do you have difficulty maintaining continence: No Do you need  assistance with getting out of bed/getting out of Kara chair/moving?: Yes (walker) Do you have difficulty managing or taking your medications?: Yes (son provides oversight)  Follow up appointments reviewed: PCP Follow-up appointment confirmed?: Yes (patient's son declines for RN CM to schedule sooner appointment, states he will call if decides on sooner appointment) MD Provider Line Number:[phone] Given: No Date of PCP follow-up appointment?: 02/13/24 Follow-up Provider: [facility] Follow-up appointment confirmed?: NA Do you need transportation to your follow-up appointment?: No Do you understand care options if your condition(s) worsen?: Yes-patient verbalized understanding  SDOH Interventions Today    Flowsheet Row Most Recent Value  SDOH Interventions   Food Insecurity Interventions Intervention Not Indicated  Housing  Interventions Intervention Not Indicated  Transportation Interventions Intervention Not Indicated  Utilities Interventions Intervention Not Indicated    [name], BSN RN Care Manager/ Transition of Care [place] [phone]  "

## 2024-01-16 ENCOUNTER — Telehealth: Payer: Medicare (Managed Care) | Admitting: *Deleted

## 2024-01-16 ENCOUNTER — Other Ambulatory Visit: Payer: Self-pay | Admitting: Obstetrics and Gynecology

## 2024-01-16 ENCOUNTER — Other Ambulatory Visit: Payer: Self-pay | Admitting: Family Medicine

## 2024-01-16 DIAGNOSIS — N950 Postmenopausal bleeding: Secondary | ICD-10-CM

## 2024-01-19 ENCOUNTER — Other Ambulatory Visit: Payer: Medicare (Managed Care) | Admitting: *Deleted

## 2024-01-19 ENCOUNTER — Encounter: Payer: Self-pay | Admitting: *Deleted

## 2024-01-19 ENCOUNTER — Other Ambulatory Visit: Payer: Self-pay

## 2024-01-19 NOTE — Patient Outreach (Signed)
 Complex Care Management   Visit Note  01/19/2024  Name:  [name] MRN: [id number] DOB: 1955-12-29  Situation: Referral received for Complex Care Management related to NASH and frequent hospital admissions. I obtained verbal consent from son, [name], who is patient's legal guardian.  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 No cognitive symptoms today.   Health Maintenance Behaviors: Annual physical exam, Sleep adequate Healing Pattern: Unsure Health Facilitated by: Rest  Neurological Neurological Review of Symptoms: No symptoms reported    HEENT HEENT Symptoms Reported: No symptoms reported      Cardiovascular Cardiovascular Symptoms Reported: Swelling in legs or feet Does patient have uncontrolled Hypertension?: No Cardiovascular Management Strategies: Medication therapy, Diet  modification, Routine screening Weight:  (unavailable today) Cardiovascular Self-Management Outcome: 3 (uncertain) Cardiovascular Comment: Son reports bilateral leg swelling from hips to feet. Negative for SOB, abdominal swelling, cough, or orthopnea. RN outreached Ronal Slater Barge, LPN with [facility] regarding need for hospital follow-up as soon as possible. Patient is scheduled for routine visit on 02/13/24. Hospital return precautions discussed. Son stated understanding. He is very knowledgeable about her medical history and is very involved in her care. Lasix  and potassium were D/C at hospital discharge on 01/16/24. Son has continued to administer potassium 20 mEq BID per previous instructions because her potassium level was low normal at discharge. Educated that aldosterone is potassium sparing and that the lasix  that was discontinued causes potassium loss. Son administered lasix  40mg  today, as per previous instructions due to lower extremity edema.  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 Also using lactulose , as prescribed, to manage ammonia levels. Sees gastroenterologist at [facility] for NASH. Last visit was 11/2023. 4 Hospital admissions in December for hepatic encephalopathy. Reviewed and discussed most recent hospital discharge instructions, notes from stay, imaging reports, and lab results. Gastrointestinal Management Strategies: Adequate rest, Medication therapy Gastrointestinal Self-Management Outcome: 3 (uncertain) Gastrointestinal Comment: Discussed hospital return precautions related to elevated ammonia levels. Patient's son is very familiar with her signs/symptoms and doesn't hesitate to  seek care if needed.    Genitourinary Genitourinary Symptoms Reported: No symptoms reported Additional Genitourinary Details: Followed by nephrologist  at [facility]. Next visit is scheduled for 04/2024. Diet restricted to 2 grams of sodium per day. Midodrine  for hypotension. Son states that nephrologist manages electrolytes. Reviewed and discussed most recent potassium and sodium levels from hospital admission. Genitourinary Management Strategies: Diet modification, Medication therapy Genitourinary Self-Management Outcome: 3 (uncertain) Genitourinary Comment: Follow-up with nephrologist regarding medications since lasix  and potassium were discontinued. Aldactone  was maintained and patient is taking sodium bicarbonate  650 grams per day.  Integumentary Integumentary Symptoms Reported: No symptoms reported    Musculoskeletal Musculoskelatal Symptoms Reviewed: Limited mobility Additional Musculoskeletal Details: uses walker for ambulation Musculoskeletal Management Strategies: Medical device, Adequate rest, Routine screening Musculoskeletal Self-Management Outcome: 4 (good) Falls in the past year?: No Number of falls in past year: 1 or less Was there an injury with Fall?: No Fall Risk Category Calculator: 0 Patient Fall Risk Level: Low Fall Risk Patient at Risk for Falls Due to: Mental status change (episodes of mental status change due to elevated ammonia levels)  Psychosocial Psychosocial Symptoms Reported: No symptoms reported (per son)     Quality of Family Relationships: supportive, involved, helpful Do you feel physically threatened by others?:  (Unable to speak with patient today. Visit completed with son who is her legal guardian)    01/19/2024    PHQ2-9 Depression Screening   Little interest or pleasure in doing things    Feeling down, depressed, or hopeless    PHQ-2 - Total Score    Trouble falling or staying asleep, or sleeping too much    Feeling tired or  having little energy    Poor appetite or overeating     Feeling bad about yourself - or that you are a failure or have let yourself or your family down    Trouble concentrating on things, such as reading the newspaper or watching television    Moving or speaking so slowly that other people could have noticed.  Or the opposite - being so fidgety or restless that you have been moving around a lot more than usual    Thoughts that you would be better off dead, or hurting yourself in some way    PHQ2-9 Total Score    If you checked off any problems, how difficult have these problems made it for you to do your work, take care of things at home, or get along with other people    Depression Interventions/Treatment      Today's Vitals      Medications Reviewed Today     Reviewed by Charlsie Josette SAILOR, RN (Registered Nurse) on 01/19/24 at 1742  Med List Status: <None>   Medication Order Taking? Sig Documenting Provider Last Dose Status Informant  acetaminophen  (TYLENOL ) 500 MG tablet [id number] Yes Take 500-1,000 mg by mouth every 6 (six) hours as needed for headache, fever or mild pain (pain score 1-3). [provider]  Active Family Member  albuterol  (VENTOLIN  HFA) 108 [phone] Base) MCG/ACT inhaler [id number] Yes Inhale 2 puffs into the lungs every 6 (six) hours as needed for wheezing or shortness of breath. [name], MD  Active Family Member  atorvastatin  (LIPITOR) 40 MG tablet [id number] Yes TAKE 1 TABLET(40 MG) BY MOUTH IN THE ERMALINDA [name], MD  Active Family Member  colchicine  0.6 MG tablet [id number] Yes Take 0.6 mg by mouth 2 (two) times daily as needed (as directed for gout flares). [provider]  Active Family Member  copper  tablet [id number] Yes Take 2 mg by mouth in the morning  and at bedtime. [provider]  Active Family Member  cyanocobalamin  (VITAMIN B12) 1000 MCG tablet [id number] Yes Take 2,000 mcg by mouth every Monday, Wednesday, and Friday.  [provider]  Active Family Member  dicyclomine  (BENTYL ) 10 MG capsule [id number] Yes Take 1 capsule (10 mg total) by mouth 3 (three) times daily as needed for spasms (abdominal cramping). [name], MD  Active Family Member  EPINEPHrine  0.3 mg/0.3 mL IJ SOAJ injection [id number] Yes Inject 0.3 mg into the muscle as needed for anaphylaxis. [name], MD  Active Family Member  famotidine  (PEPCID ) 20 MG tablet [id number] Yes Take 20 mg by mouth daily as needed for heartburn or indigestion. [provider]  Active Family Member  HYDROcodone -acetaminophen  (NORCO) 10-325 MG tablet [id number] Yes Take 1 tablet by mouth 3 (three) times daily as needed (for pain- may take up to a sum total of 3 tablets/24 hours). [provider]  Active Family Member           Med Note MARISA, NATHANEL SAILOR   Tue Jan 02, 2024  4:18 PM)    IRON -VITAMIN C  PO [id number] Yes Take 1 tablet by mouth daily. [provider]  Active Family Member           Med Note MARISA, NATHANEL SAILOR   Tue Jan 09, 2024  8:11 PM) Strength unconfirmed  lactulose  (CHRONULAC ) 10 GM/15ML solution [id number] Yes Take 45 mLs (30 g total) by mouth 3 (three) times daily. Barbarann Nest, MD  Active Family Member  levothyroxine  (SYNTHROID ) 175 MCG tablet [id number] Yes Take 1 tablet (175 mcg total) by mouth daily before breakfast. [name], MD  Active Family Member  magnesium  gluconate (MAGONATE) 500 (27 Mg) MG TABS tablet [id number] Yes Take 1 tablet (500 mg total) by mouth in the morning and at bedtime. [name], MD  Active Family Member  meclizine  (ANTIVERT ) 25 MG tablet [id number] Yes Take 1 tablet (25 mg total) by mouth 3 (three) times daily as needed for dizziness. Jillian Buttery, MD  Active Family Member  megestrol  (MEGACE ) 40 MG tablet [id number] Yes Take 1 tablet (40 mg total) by mouth 2 (two) times daily. Ajewole, [name], MD  Active Family Member  methocarbamol  (ROBAXIN ) 500 MG tablet [id number] Yes Take  500 mg by mouth in the morning. [provider]  Active Family Member  midodrine  (PROAMATINE ) 10 MG tablet [id number] Yes TAKE 1 TABLET(10 MG) BY MOUTH THREE TIMES DAILY [name], MD  Active   Multiple Vitamins-Minerals (CENTRUM SILVER  ULTRA WOMENS PO) [id number] Yes Take 1 tablet by mouth daily with breakfast. [provider]  Active Family Member  naloxone [facility]) nasal spray 4 mg/0.1 mL [id number] Yes Place 1 spray into the nose as needed (accidental overdose). [provider]  Active Family Member           Med Note ROLENE [place], [place] A   Wed Mar 08, 2023  2:39 PM)    pantoprazole  (PROTONIX ) 40 MG tablet [id number] Yes TAKE 1 TABLET(40 MG) BY MOUTH TWICE DAILY BEFORE A MEAL [name], MD  Active Family Member  rifaximin  (XIFAXAN ) 550 MG TABS tablet [id number] Yes Take 550 mg by mouth in the morning and at bedtime. [provider]  Active Family Member  rOPINIRole  (REQUIP ) 0.25 MG tablet [id number] Yes TAKE 1 TABLET(0.25 MG) BY MOUTH AT BEDTIME [name], MD  Active Family Member  sodium bicarbonate  650 MG tablet [id number] Yes Take 1 tablet (650 mg total) by mouth 2 (two) times daily.  Barbarann Nest, MD  Active Family Member  spironolactone  (ALDACTONE ) 50 MG tablet [id number] Yes Take 1 tablet (50 mg total) by mouth daily. [name], MD  Active Family Member  triamcinolone  cream (KENALOG ) 0.1 % [id number] Yes Apply 1 Application topically 2 (two) times daily as needed (skin irritation). [provider]  Active Family Member  venlafaxine  XR (EFFEXOR -XR) 75 MG 24 hr capsule [id number] Yes Take 1 capsule (75 mg total) by mouth daily with breakfast. Duanne Butler DASEN, MD  Active Family Member  Med List Note Lesly, Richerd GRADE, [place] 12/29/23 9070): [name] (son) handles all medication 12/29/23 reviewed with son            Recommendation:   Acute PCP follow-up for hospital follow-up and lower extremity edema. Specialty provider  follow-up GI and nephrologist Return to ED if necessary for signs/symptoms of elevated ammonia level or any other emergent symptoms  Follow Up Plan:   Telephone follow up appointment date/time:  01/22/24 at 11:30  Josette Pellet, RN, BSN [place]  [facility] RN Care Manager Direct Dial: [phone]  Fax: [phone]

## 2024-01-22 ENCOUNTER — Other Ambulatory Visit: Payer: Self-pay | Admitting: *Deleted

## 2024-01-24 ENCOUNTER — Telehealth: Payer: Self-pay | Admitting: Lactation Services

## 2024-01-24 NOTE — Telephone Encounter (Signed)
 Called and spoke with patient's son. Her son reports that they have 1.5 bottles at home. He reports that patient was hospitalized during her appointments and reports he has been trying to get his mom rescheduled without success. He reports he has called several times to reschedule and cannot get a call back.   Apologized to patient and son and Message sent to front office to call patient to reschedule her appointment.

## 2024-01-30 ENCOUNTER — Telehealth: Payer: Medicare (Managed Care) | Admitting: *Deleted

## 2024-02-03 ENCOUNTER — Other Ambulatory Visit: Payer: Self-pay | Admitting: Family Medicine

## 2024-02-03 ENCOUNTER — Telehealth: Payer: Medicare (Managed Care) | Admitting: Nurse Practitioner

## 2024-02-03 DIAGNOSIS — G2581 Restless legs syndrome: Secondary | ICD-10-CM | POA: Diagnosis not present

## 2024-02-03 MED ORDER — ROPINIROLE HCL 0.25 MG PO TABS: 0.2500 mg | ORAL_TABLET | Freq: Every day | ORAL | 0 refills | Status: AC

## 2024-02-03 NOTE — Progress Notes (Signed)
 Your requip  has been sent for 30 days. Please follow up with your PCP for additional refills.  .  Your e-visit answers were reviewed by a board certified advanced clinical practitioner to complete your personal care plan.  Depending on the condition, your plan could have included both over the counter or prescription medications.  If there is a problem, please reply once you have received a response from your provider.  Your safety is important to us .  If you have drug allergies, check your prescription carefully.    You can use MyChart to ask questions about todays visit, request a non-urgent call back, or ask for a work or school excuse for 24 hours related to this e-Visit. If it has been greater than 24 hours you will need to follow up with your provider or enter a new e-Visit to address those concerns.  You will get an e-mail in the next two days asking about your experience.  I hope that your e-visit has been valuable and will speed your recovery. Thank you for using e-visits.   I have spent 5 minutes in review of e-visit questionnaire, review and updating patient chart, medical decision making and response to patient.   Shontay Wallner W Adrain Butrick, NP

## 2024-02-05 NOTE — Telephone Encounter (Signed)
 Refilled 02/03/24 # 30. Requested Prescriptions  Refused Prescriptions Disp Refills   rOPINIRole  (REQUIP ) 0.25 MG tablet [Pharmacy Med Name: ROPINIROLE  0.25MG  TABLETS] 90 tablet 1    Sig: TAKE 1 TABLET(0.25 MG) BY MOUTH AT BEDTIME     Neurology:  Parkinsonian Agents Passed - 02/05/2024 12:07 PM      Passed - Last BP in normal range    BP Readings from Last 1 Encounters:  01/13/24 (!) 110/50         Passed - Last Heart Rate in normal range    Pulse Readings from Last 1 Encounters:  01/13/24 86         Passed - Valid encounter within last 12 months    Recent Outpatient Visits           2 months ago Acute hepatic encephalopathy (HCC)   [place] [facility] [name], MD   5 months ago Acute hepatic encephalopathy [facility])   [place] [facility] [name], MD   6 months ago Acute hepatic encephalopathy [facility])   [place] [facility] [name], MD   9 months ago Edema of both lower legs   [place] [facility] [name], MD   11 months ago Pruritus   [place] [facility] [name], MD

## 2024-02-13 ENCOUNTER — Ambulatory Visit: Payer: Medicare (Managed Care) | Admitting: Family Medicine

## 2024-02-14 ENCOUNTER — Other Ambulatory Visit: Payer: Self-pay

## 2024-02-14 VITALS — BP 115/78 | Wt 205.0 lb

## 2024-02-14 DIAGNOSIS — K7581 Nonalcoholic steatohepatitis (NASH): Secondary | ICD-10-CM

## 2024-02-14 NOTE — Patient Outreach (Signed)
 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  I obtained verbal consent from Caregiver.  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   Health Maintenance Behaviors: Annual physical exam, Sleep adequate Healing Pattern: Slow Health Facilitated by: Healthy diet, Rest  Neurological Neurological Review of Symptoms: No symptoms reported Neurological Management Strategies: Adequate rest Neurological Self-Management Outcome: 4 (good)  HEENT HEENT Symptoms Reported: No symptoms reported HEENT Management Strategies: Routine screening HEENT Self-Management Outcome: 4 (good)     Cardiovascular Cardiovascular Symptoms Reported: No symptoms reported (Son states that since starting Lasix - all edema is gone.) Does patient have uncontrolled Hypertension?: No Cardiovascular Management Strategies: Medication therapy, Routine screening, Coping strategies, Adequate rest Weight: 205 lb (93 kg) Cardiovascular Self-Management Outcome: 4 (good)  Respiratory Respiratory Symptoms Reported: No symptoms reported Respiratory Management Strategies: Adequate rest Respiratory Self-Management Outcome: 4 (good)  Endocrine Endocrine Symptoms Reported: No symptoms reported Is patient diabetic?: No Endocrine Self-Management Outcome: 4 (good)  Gastrointestinal Gastrointestinal Symptoms Reported: Diarrhea Additional Gastrointestinal Details: Lactulose  for ammonia levels- patient has frrequent bowel movements (6-8 times daily) Gastrointestinal Management Strategies: Adequate rest, Medication therapy Gastrointestinal Self-Management Outcome: 3 (uncertain) Gastrointestinal Comment: Son- Rush is very familiar with what to watch for with elevated ammonial levels. Will seek care if/when needed.    Genitourinary Genitourinary Symptoms Reported: Frequency Additional Genitourinary Details: Takes fluid pills and does go frequently. Genitourinary Management Strategies: Medication therapy Genitourinary Self-Management Outcome: 4 (good)  Integumentary Integumentary Symptoms Reported: Other Other Integumentary Symptoms: dry skin- son keeps skin moisturized Skin Management Strategies: Routine screening Skin Self-Management Outcome: 4 (good)  Musculoskeletal Musculoskelatal Symptoms Reviewed: Difficulty walking, Unsteady gait, Limited mobility, Back pain Additional Musculoskeletal Details: uses walker for ambulation Musculoskeletal Management Strategies: Medical device, Routine screening Musculoskeletal Self-Management Outcome: 4 (good) Falls in the past year?: No Number of falls in past year: 1 or  less Was there an injury with Fall?: No Fall Risk Category Calculator: 0 Patient Fall Risk Level: Low Fall Risk Fall risk Follow up: Falls prevention discussed  Psychosocial Psychosocial Symptoms Reported: No symptoms reported Behavioral Management Strategies: Support system, Adequate rest Behavioral Health Self-Management Outcome: 4 (good) Major Change/Loss/Stressor/Fears (CP): Denies Techniques to  Cope with Loss/Stress/Change: Not applicable Quality of Family Relationships: involved, helpful, supportive Do you feel physically threatened by others?: No    02/14/2024    PHQ2-9 Depression Screening   Little interest or pleasure in doing things Not at all  Feeling down, depressed, or hopeless Not at all  PHQ-2 - Total Score 0  Trouble falling or staying asleep, or sleeping too much    Feeling tired or having little energy    Poor appetite or overeating     Feeling bad about yourself - or that you are a failure or have let yourself or your family down    Trouble concentrating on things, such as reading the newspaper or watching television    Moving or speaking so slowly that other people could have noticed.  Or the opposite - being so fidgety or restless that you have been moving around a lot more than usual    Thoughts that you would be better off dead, or hurting yourself in some way    PHQ2-9 Total Score    If you checked off any problems, how difficult have these problems made it for you to do your work, take care of things at home, or get along with other people    Depression Interventions/Treatment      Today's Vitals   02/14/24 1330  BP: 115/78  Weight: 205 lb (93 kg)   Pain Scale: Faces Faces Pain Scale: Hurts a little bit Pain Type: Chronic pain Pain Location: Back Pain Orientation: Lower Pain Descriptors / Indicators: Aching Pain Onset: On-going Patients Stated Pain Goal: 0 Pain Intervention(s): Medication (See eMAR), Back rub, Rest  Medications Reviewed Today      Reviewed by Leodis Warren DEL, RN (Registered Nurse) on 02/14/24 at 1323  Med List Status: <None>   Medication Order Taking? Sig Documenting Provider Last Dose Status Informant  acetaminophen  (TYLENOL ) 500 MG tablet [id number] Yes Take 500-1,000 mg by mouth every 6 (six) hours as needed for headache, fever or mild pain (pain score 1-3). [provider]  Active Family Member  albuterol  (VENTOLIN  HFA) [phone] Base) MCG/ACT inhaler [id number] Yes Inhale 2 puffs into the lungs every 6 (six) hours as needed for wheezing or shortness of breath. [name], MD  Active Family Member  atorvastatin  (LIPITOR) 40 MG tablet [id number] Yes TAKE 1 TABLET(40 MG) BY MOUTH IN THE ERMALINDA [name], MD  Active Family Member  colchicine  0.6 MG tablet [id number] Yes Take 0.6 mg by mouth 2 (two) times daily as needed (as directed for gout flares). [provider]  Active Family Member  copper  tablet [id number] Yes Take 2 mg by mouth in the morning and at bedtime. [provider]  Active Family Member  cyanocobalamin  (VITAMIN B12) 1000 MCG tablet [id number] Yes Take 2,000 mcg by mouth every Monday, Wednesday, and Friday. [provider]  Active Family Member  dicyclomine  (BENTYL ) 10 MG capsule [id number] Yes Take 1 capsule (10 mg total) by mouth 3 (three) times daily as needed for spasms (abdominal cramping). [name], MD  Active Family Member  EPINEPHrine  0.3 mg/0.3 mL IJ SOAJ injection [id number] Yes Inject 0.3 mg into the muscle as needed for anaphylaxis. [name], MD  Active Family Member  famotidine  (PEPCID ) 20 MG tablet [id number] Yes Take 20 mg by mouth daily as needed for heartburn or indigestion. [provider]  Active Family Member  HYDROcodone -acetaminophen  [facility]) 10-325 MG tablet [id number] Yes Take 1 tablet by mouth 3 (three) times daily as needed (for  pain- may take up to a sum total of 3 tablets/24 hours). [provider]  Active Family Member            Med Note MARISA, NATHANEL SAILOR   Tue Jan 02, 2024  4:18 PM)    IRON -VITAMIN C  PO [id number] Yes Take 1 tablet by mouth daily. [provider]  Active Family Member           Med Note MARISA, NATHANEL SAILOR   Tue Jan 09, 2024  8:11 PM) Strength unconfirmed  lactulose  (CHRONULAC ) 10 GM/15ML solution [id number] Yes Take 45 mLs (30 g total) by mouth 3 (three) times daily. Barbarann Nest, MD  Active Family Member  levothyroxine  (SYNTHROID ) 175 MCG tablet [id number] Yes Take 1 tablet (175 mcg total) by mouth daily before breakfast. [name], MD  Active Family Member  magnesium  gluconate (MAGONATE) 500 (27 Mg) MG TABS tablet [id number] Yes Take 1 tablet (500 mg total) by mouth in the morning and at bedtime. [name], MD  Active Family Member  meclizine  (ANTIVERT ) 25 MG tablet [id number] Yes Take 1 tablet (25 mg total) by mouth 3 (three) times daily as needed for dizziness. Jillian Buttery, MD  Active Family Member  megestrol  (MEGACE ) 40 MG tablet [id number] Yes Take 1 tablet (40 mg total) by mouth 2 (two) times daily. Ajewole, [name], MD  Active Family Member  methocarbamol  (ROBAXIN ) 500 MG tablet [id number] Yes Take 500 mg by mouth in the morning. [provider]  Active Family Member  midodrine  (PROAMATINE ) 10 MG tablet [id number] Yes TAKE 1 TABLET(10 MG) BY MOUTH THREE TIMES DAILY [name], MD  Active   Multiple Vitamins-Minerals (CENTRUM SILVER  ULTRA WOMENS PO) [id number] Yes Take 1 tablet by mouth daily with breakfast. [provider]  Active Family Member  naloxone [facility]) nasal spray 4 mg/0.1 mL [id number] Yes Place 1 spray into the nose as needed (accidental overdose). [provider]  Active Family Member           Med Note ROLENE Weippe, [place] A   Wed Mar 08, 2023  2:39 PM)    pantoprazole  (PROTONIX ) 40 MG tablet [id number] Yes TAKE 1 TABLET(40 MG) BY MOUTH TWICE DAILY BEFORE A MEAL [name], MD  Active Family Member  rifaximin  (XIFAXAN )  550 MG TABS tablet [id number] Yes Take 550 mg by mouth in the morning and at bedtime. [provider]  Active Family Member  rOPINIRole  (REQUIP ) 0.25 MG tablet [id number] Yes Take 1 tablet (0.25 mg total) by mouth at bedtime. Theotis Haze ORN, NP  Active   sodium bicarbonate  650 MG tablet [id number] Yes Take 1 tablet (650 mg total) by mouth 2 (two) times daily. Barbarann Nest, MD  Active Family Member  spironolactone  (ALDACTONE ) 50 MG tablet [id number] Yes Take 1 tablet (50 mg total) by mouth daily. [name], MD  Active Family Member  triamcinolone  cream (KENALOG ) 0.1 % [id number] Yes Apply 1 Application topically 2 (two) times daily as needed (skin irritation). [provider]  Active Family Member  venlafaxine  XR (EFFEXOR -XR) 75 MG 24 hr capsule [id number] Yes Take 1 capsule (75 mg total) by mouth daily with breakfast. Duanne Butler DASEN, MD  Active Family Member  Med List Note Schuyler Josette SAILOR, RN 01/19/24 1746): 01/19/24 Lasix  and potassium were D/C at hospital discharge on 01/16/24. Son has continued potassium, 20 mEq BID and patient took lasix  40mg  today due to lower extremity edema.             Recommendation:  Continue Current Plan of Care  Follow Up Plan:   Telephone follow up appointment date/time:  03/13/24 at 2 pm.  Warren Quivers RN CM Population Health-Complex Care Management [facility] [phone]

## 2024-02-14 NOTE — Patient Instructions (Signed)
 Visit Information  Thank you for taking time to visit with me today. Please don't hesitate to contact me if I can be of assistance to you before our next scheduled appointment.  Your next care management appointment is by telephone on 03/13/24 at 2 pm   Please call the care guide team at [phone] if you need to cancel, schedule, or reschedule an appointment.   Please call the Suicide and Crisis Lifeline: 988 call the [place]  National Suicide Prevention Lifeline: 346-497-6957 or TTY: (859) 606-4076 TTY 860-544-6793) to talk to a trained counselor call 1-800-273-TALK (toll free, 24 hour hotline) if you are experiencing a Mental Health or Behavioral Health Crisis or need someone to talk to.  Warren Quivers RN CM Population Health-Complex Care Management [facility] [phone]

## 2024-02-15 ENCOUNTER — Other Ambulatory Visit: Payer: Self-pay | Admitting: Family Medicine

## 2024-02-15 ENCOUNTER — Telehealth: Payer: Self-pay

## 2024-02-15 NOTE — Progress Notes (Signed)
 Complex Care Management Note  Care Guide Note 02/15/2024 Name: [name] MRN: [id number] DOB: [birth year]  [name] is a 69 y.o. year old female who sees [name], MD for primary care. I reached out to [name] by phone today to offer complex care management services.  [name] was given information about Complex Care Management services today including:   The Complex Care Management services include support from the care team which includes your Nurse Care Manager, Clinical Social Worker, or Pharmacist.  The Complex Care Management team is here to help remove barriers to the health concerns and goals most important to you. Complex Care Management services are voluntary, and the patient may decline or stop services at any time by request to their care team member.   Complex Care Management Consent Status: Patient agreed to services and verbal consent obtained.   Follow up plan:  Telephone appointment with complex care management team member scheduled for:  02/23/24  Encounter Outcome:  Patient Scheduled  Debbe Fuse [facility] Guide  Direct Dial: [phone]  Fax [phone]

## 2024-02-23 ENCOUNTER — Telehealth: Payer: Medicare (Managed Care)

## 2024-03-04 ENCOUNTER — Ambulatory Visit: Payer: Medicare (Managed Care) | Admitting: Family Medicine

## 2024-03-11 ENCOUNTER — Ambulatory Visit: Payer: Self-pay | Admitting: Obstetrics and Gynecology

## 2024-03-13 ENCOUNTER — Telehealth: Payer: Medicare (Managed Care)

## 2024-09-25 ENCOUNTER — Ambulatory Visit: Payer: Medicare (Managed Care)
